# Patient Record
Sex: Female | Born: 1935 | Race: White | Hispanic: No | Marital: Married | Smoking: Never smoker
Health system: Southern US, Community
[De-identification: ages and names within clinical notes are randomized; demographics above are authoritative.]

## PROBLEM LIST (undated history)

## (undated) DIAGNOSIS — Z794 Long term (current) use of insulin: Secondary | ICD-10-CM

## (undated) DIAGNOSIS — G8929 Other chronic pain: Secondary | ICD-10-CM

## (undated) DIAGNOSIS — N393 Stress incontinence (female) (male): Secondary | ICD-10-CM

## (undated) DIAGNOSIS — I5030 Unspecified diastolic (congestive) heart failure: Secondary | ICD-10-CM

## (undated) DIAGNOSIS — I10 Essential (primary) hypertension: Secondary | ICD-10-CM

## (undated) DIAGNOSIS — M1990 Unspecified osteoarthritis, unspecified site: Secondary | ICD-10-CM

## (undated) DIAGNOSIS — Z8673 Personal history of transient ischemic attack (TIA), and cerebral infarction without residual deficits: Secondary | ICD-10-CM

## (undated) DIAGNOSIS — G4733 Obstructive sleep apnea (adult) (pediatric): Secondary | ICD-10-CM

## (undated) DIAGNOSIS — R399 Unspecified symptoms and signs involving the genitourinary system: Secondary | ICD-10-CM

## (undated) DIAGNOSIS — J449 Chronic obstructive pulmonary disease, unspecified: Secondary | ICD-10-CM

## (undated) DIAGNOSIS — E119 Type 2 diabetes mellitus without complications: Secondary | ICD-10-CM

## (undated) DIAGNOSIS — M51369 Other intervertebral disc degeneration, lumbar region without mention of lumbar back pain or lower extremity pain: Secondary | ICD-10-CM

## (undated) DIAGNOSIS — G629 Polyneuropathy, unspecified: Secondary | ICD-10-CM

## (undated) DIAGNOSIS — I8390 Asymptomatic varicose veins of unspecified lower extremity: Secondary | ICD-10-CM

## (undated) DIAGNOSIS — L409 Psoriasis, unspecified: Secondary | ICD-10-CM

## (undated) DIAGNOSIS — I2510 Atherosclerotic heart disease of native coronary artery without angina pectoris: Secondary | ICD-10-CM

## (undated) DIAGNOSIS — M5136 Other intervertebral disc degeneration, lumbar region: Secondary | ICD-10-CM

## (undated) DIAGNOSIS — E785 Hyperlipidemia, unspecified: Secondary | ICD-10-CM

## (undated) DIAGNOSIS — E039 Hypothyroidism, unspecified: Secondary | ICD-10-CM

## (undated) DIAGNOSIS — N329 Bladder disorder, unspecified: Secondary | ICD-10-CM

## (undated) DIAGNOSIS — R319 Hematuria, unspecified: Secondary | ICD-10-CM

## (undated) DIAGNOSIS — I252 Old myocardial infarction: Secondary | ICD-10-CM

## (undated) HISTORY — PX: TOTAL ABDOMINAL HYSTERECTOMY W/ BILATERAL SALPINGOOPHORECTOMY: SHX83

## (undated) HISTORY — PX: CARDIAC CATHETERIZATION: SHX172

## (undated) HISTORY — PX: CARDIOVASCULAR STRESS TEST: SHX262

## (undated) HISTORY — PX: LUMBAR SPINE SURGERY: SHX701

## (undated) HISTORY — DX: Polyneuropathy, unspecified: G62.9

## (undated) HISTORY — DX: Chronic obstructive pulmonary disease, unspecified: J44.9

## (undated) HISTORY — PX: CARPAL TUNNEL RELEASE: SHX101

## (undated) HISTORY — PX: CORONARY ARTERY BYPASS GRAFT: SHX141

## (undated) HISTORY — PX: APPENDECTOMY: SHX54

---

## 1988-12-23 HISTORY — PX: CHOLECYSTECTOMY: SHX55

## 1989-12-23 HISTORY — PX: CARDIAC CATHETERIZATION: SHX172

## 1993-12-23 HISTORY — PX: ROTATOR CUFF REPAIR: SHX139

## 1998-04-09 ENCOUNTER — Inpatient Hospital Stay (HOSPITAL_COMMUNITY): Admission: EM | Admit: 1998-04-09 | Discharge: 1998-04-12 | Payer: Self-pay | Admitting: Psychiatry

## 1998-08-10 ENCOUNTER — Emergency Department (HOSPITAL_COMMUNITY): Admission: EM | Admit: 1998-08-10 | Discharge: 1998-08-10 | Payer: Self-pay | Admitting: Emergency Medicine

## 1999-05-14 ENCOUNTER — Encounter: Payer: Self-pay | Admitting: Neurosurgery

## 1999-05-14 ENCOUNTER — Ambulatory Visit (HOSPITAL_COMMUNITY): Admission: RE | Admit: 1999-05-14 | Discharge: 1999-05-14 | Payer: Self-pay | Admitting: Neurosurgery

## 1999-05-31 ENCOUNTER — Encounter: Payer: Self-pay | Admitting: Neurosurgery

## 1999-05-31 ENCOUNTER — Ambulatory Visit (HOSPITAL_COMMUNITY): Admission: RE | Admit: 1999-05-31 | Discharge: 1999-05-31 | Payer: Self-pay | Admitting: Neurosurgery

## 1999-06-14 ENCOUNTER — Encounter: Payer: Self-pay | Admitting: Neurosurgery

## 1999-06-14 ENCOUNTER — Ambulatory Visit (HOSPITAL_COMMUNITY): Admission: RE | Admit: 1999-06-14 | Discharge: 1999-06-14 | Payer: Self-pay | Admitting: Neurosurgery

## 1999-06-28 ENCOUNTER — Ambulatory Visit (HOSPITAL_COMMUNITY): Admission: RE | Admit: 1999-06-28 | Discharge: 1999-06-28 | Payer: Self-pay | Admitting: Neurosurgery

## 1999-06-28 ENCOUNTER — Encounter: Payer: Self-pay | Admitting: Neurosurgery

## 1999-08-07 ENCOUNTER — Inpatient Hospital Stay (HOSPITAL_COMMUNITY): Admission: AD | Admit: 1999-08-07 | Discharge: 1999-08-10 | Payer: Self-pay | Admitting: Cardiology

## 1999-09-04 ENCOUNTER — Encounter: Admission: RE | Admit: 1999-09-04 | Discharge: 1999-12-03 | Payer: Self-pay | Admitting: Cardiology

## 1999-09-21 ENCOUNTER — Inpatient Hospital Stay (HOSPITAL_COMMUNITY): Admission: AD | Admit: 1999-09-21 | Discharge: 1999-09-28 | Payer: Self-pay | Admitting: Cardiology

## 1999-09-22 ENCOUNTER — Encounter: Payer: Self-pay | Admitting: Cardiology

## 1999-09-23 ENCOUNTER — Encounter: Payer: Self-pay | Admitting: Cardiology

## 1999-09-25 ENCOUNTER — Encounter: Payer: Self-pay | Admitting: Cardiology

## 1999-09-27 ENCOUNTER — Encounter: Payer: Self-pay | Admitting: Cardiology

## 1999-11-12 ENCOUNTER — Inpatient Hospital Stay (HOSPITAL_COMMUNITY): Admission: EM | Admit: 1999-11-12 | Discharge: 1999-11-14 | Payer: Self-pay | Admitting: Emergency Medicine

## 1999-11-13 HISTORY — PX: CORONARY ANGIOPLASTY: SHX604

## 2000-01-17 ENCOUNTER — Inpatient Hospital Stay (HOSPITAL_COMMUNITY): Admission: AD | Admit: 2000-01-17 | Discharge: 2000-01-18 | Payer: Self-pay | Admitting: Cardiology

## 2000-01-17 HISTORY — PX: CORONARY ANGIOPLASTY WITH STENT PLACEMENT: SHX49

## 2000-01-18 ENCOUNTER — Encounter: Payer: Self-pay | Admitting: Cardiology

## 2000-03-07 ENCOUNTER — Encounter: Payer: Self-pay | Admitting: Neurosurgery

## 2000-03-07 ENCOUNTER — Encounter: Admission: RE | Admit: 2000-03-07 | Discharge: 2000-03-07 | Payer: Self-pay | Admitting: Neurosurgery

## 2000-03-16 ENCOUNTER — Ambulatory Visit (HOSPITAL_COMMUNITY): Admission: RE | Admit: 2000-03-16 | Discharge: 2000-03-16 | Payer: Self-pay | Admitting: Neurosurgery

## 2000-03-16 ENCOUNTER — Encounter: Payer: Self-pay | Admitting: Neurosurgery

## 2000-06-30 ENCOUNTER — Inpatient Hospital Stay (HOSPITAL_COMMUNITY): Admission: EM | Admit: 2000-06-30 | Discharge: 2000-07-10 | Payer: Self-pay | Admitting: Cardiology

## 2000-07-01 ENCOUNTER — Encounter: Payer: Self-pay | Admitting: Cardiology

## 2000-07-01 HISTORY — PX: CARDIAC CATHETERIZATION: SHX172

## 2000-07-02 ENCOUNTER — Encounter: Payer: Self-pay | Admitting: Cardiothoracic Surgery

## 2000-07-02 ENCOUNTER — Encounter: Payer: Self-pay | Admitting: Thoracic Surgery (Cardiothoracic Vascular Surgery)

## 2000-07-03 ENCOUNTER — Encounter: Payer: Self-pay | Admitting: Thoracic Surgery (Cardiothoracic Vascular Surgery)

## 2000-07-04 ENCOUNTER — Encounter: Payer: Self-pay | Admitting: Thoracic Surgery (Cardiothoracic Vascular Surgery)

## 2000-07-10 ENCOUNTER — Encounter: Payer: Self-pay | Admitting: Cardiothoracic Surgery

## 2000-07-21 ENCOUNTER — Encounter: Payer: Self-pay | Admitting: Thoracic Surgery (Cardiothoracic Vascular Surgery)

## 2000-07-21 ENCOUNTER — Encounter
Admission: RE | Admit: 2000-07-21 | Discharge: 2000-07-21 | Payer: Self-pay | Admitting: Thoracic Surgery (Cardiothoracic Vascular Surgery)

## 2001-04-24 ENCOUNTER — Encounter: Payer: Self-pay | Admitting: *Deleted

## 2001-04-24 ENCOUNTER — Ambulatory Visit (HOSPITAL_COMMUNITY): Admission: RE | Admit: 2001-04-24 | Discharge: 2001-04-24 | Payer: Self-pay | Admitting: *Deleted

## 2001-04-28 ENCOUNTER — Inpatient Hospital Stay (HOSPITAL_COMMUNITY): Admission: EM | Admit: 2001-04-28 | Discharge: 2001-05-01 | Payer: Self-pay | Admitting: Family Medicine

## 2001-04-29 ENCOUNTER — Encounter: Payer: Self-pay | Admitting: Family Medicine

## 2001-04-30 HISTORY — PX: CARDIAC CATHETERIZATION: SHX172

## 2001-05-28 ENCOUNTER — Encounter: Admission: RE | Admit: 2001-05-28 | Discharge: 2001-08-26 | Payer: Self-pay | Admitting: Family Medicine

## 2001-07-10 ENCOUNTER — Encounter: Payer: Self-pay | Admitting: Family Medicine

## 2001-07-10 ENCOUNTER — Ambulatory Visit (HOSPITAL_COMMUNITY): Admission: RE | Admit: 2001-07-10 | Discharge: 2001-07-10 | Payer: Self-pay | Admitting: Family Medicine

## 2001-07-11 ENCOUNTER — Encounter: Payer: Self-pay | Admitting: Emergency Medicine

## 2001-07-11 ENCOUNTER — Emergency Department (HOSPITAL_COMMUNITY): Admission: EM | Admit: 2001-07-11 | Discharge: 2001-07-11 | Payer: Self-pay | Admitting: Emergency Medicine

## 2002-07-12 ENCOUNTER — Ambulatory Visit (HOSPITAL_COMMUNITY): Admission: RE | Admit: 2002-07-12 | Discharge: 2002-07-12 | Payer: Self-pay | Admitting: Family Medicine

## 2002-07-12 ENCOUNTER — Encounter: Payer: Self-pay | Admitting: Family Medicine

## 2002-08-26 ENCOUNTER — Ambulatory Visit (HOSPITAL_COMMUNITY): Admission: RE | Admit: 2002-08-26 | Discharge: 2002-08-26 | Payer: Self-pay | Admitting: Family Medicine

## 2002-08-26 ENCOUNTER — Encounter: Payer: Self-pay | Admitting: Family Medicine

## 2002-08-31 ENCOUNTER — Ambulatory Visit (HOSPITAL_COMMUNITY): Admission: RE | Admit: 2002-08-31 | Discharge: 2002-08-31 | Payer: Self-pay | Admitting: Family Medicine

## 2002-08-31 ENCOUNTER — Encounter: Payer: Self-pay | Admitting: Family Medicine

## 2002-09-09 ENCOUNTER — Encounter: Admission: RE | Admit: 2002-09-09 | Discharge: 2002-12-08 | Payer: Self-pay | Admitting: Family Medicine

## 2002-11-03 ENCOUNTER — Ambulatory Visit (HOSPITAL_COMMUNITY): Admission: RE | Admit: 2002-11-03 | Discharge: 2002-11-03 | Payer: Self-pay | Admitting: Internal Medicine

## 2003-01-12 ENCOUNTER — Encounter: Payer: Self-pay | Admitting: Emergency Medicine

## 2003-01-13 ENCOUNTER — Inpatient Hospital Stay (HOSPITAL_COMMUNITY): Admission: EM | Admit: 2003-01-13 | Discharge: 2003-01-19 | Payer: Self-pay | Admitting: Emergency Medicine

## 2003-01-15 ENCOUNTER — Encounter: Payer: Self-pay | Admitting: Internal Medicine

## 2003-02-15 ENCOUNTER — Encounter: Payer: Self-pay | Admitting: Family Medicine

## 2003-02-15 ENCOUNTER — Ambulatory Visit (HOSPITAL_COMMUNITY): Admission: RE | Admit: 2003-02-15 | Discharge: 2003-02-15 | Payer: Self-pay | Admitting: Family Medicine

## 2003-02-28 ENCOUNTER — Ambulatory Visit (HOSPITAL_COMMUNITY): Admission: RE | Admit: 2003-02-28 | Discharge: 2003-02-28 | Payer: Self-pay | Admitting: *Deleted

## 2003-03-31 ENCOUNTER — Ambulatory Visit (HOSPITAL_COMMUNITY): Admission: RE | Admit: 2003-03-31 | Discharge: 2003-03-31 | Payer: Self-pay | Admitting: Pulmonary Disease

## 2003-04-06 ENCOUNTER — Ambulatory Visit: Admission: RE | Admit: 2003-04-06 | Discharge: 2003-04-06 | Payer: Self-pay | Admitting: Pulmonary Disease

## 2003-08-29 ENCOUNTER — Emergency Department (HOSPITAL_COMMUNITY): Admission: EM | Admit: 2003-08-29 | Discharge: 2003-08-29 | Payer: Self-pay | Admitting: Emergency Medicine

## 2003-08-29 ENCOUNTER — Encounter: Payer: Self-pay | Admitting: Emergency Medicine

## 2003-09-02 ENCOUNTER — Ambulatory Visit: Admission: RE | Admit: 2003-09-02 | Discharge: 2003-09-02 | Payer: Self-pay | Admitting: Orthopedic Surgery

## 2003-09-02 ENCOUNTER — Encounter: Payer: Self-pay | Admitting: Orthopedic Surgery

## 2003-09-26 ENCOUNTER — Ambulatory Visit (HOSPITAL_COMMUNITY): Admission: RE | Admit: 2003-09-26 | Discharge: 2003-09-26 | Payer: Self-pay | Admitting: Orthopedic Surgery

## 2003-09-26 ENCOUNTER — Encounter: Payer: Self-pay | Admitting: Orthopedic Surgery

## 2003-10-18 ENCOUNTER — Ambulatory Visit (HOSPITAL_COMMUNITY): Admission: RE | Admit: 2003-10-18 | Discharge: 2003-10-18 | Payer: Self-pay | Admitting: Orthopedic Surgery

## 2003-10-18 HISTORY — PX: KNEE ARTHROSCOPY: SUR90

## 2003-10-21 ENCOUNTER — Emergency Department (HOSPITAL_COMMUNITY): Admission: EM | Admit: 2003-10-21 | Discharge: 2003-10-21 | Payer: Self-pay | Admitting: *Deleted

## 2003-10-28 ENCOUNTER — Encounter (HOSPITAL_COMMUNITY): Admission: RE | Admit: 2003-10-28 | Discharge: 2003-11-27 | Payer: Self-pay | Admitting: Orthopedic Surgery

## 2004-01-26 ENCOUNTER — Encounter (HOSPITAL_COMMUNITY): Admission: RE | Admit: 2004-01-26 | Discharge: 2004-01-27 | Payer: Self-pay | Admitting: Cardiology

## 2004-05-29 ENCOUNTER — Ambulatory Visit (HOSPITAL_COMMUNITY): Admission: RE | Admit: 2004-05-29 | Discharge: 2004-05-29 | Payer: Self-pay | Admitting: Family Medicine

## 2005-02-12 ENCOUNTER — Ambulatory Visit (HOSPITAL_COMMUNITY): Admission: RE | Admit: 2005-02-12 | Discharge: 2005-02-12 | Payer: Self-pay | Admitting: Family Medicine

## 2005-06-19 ENCOUNTER — Ambulatory Visit (HOSPITAL_COMMUNITY): Admission: RE | Admit: 2005-06-19 | Discharge: 2005-06-19 | Payer: Self-pay | Admitting: Family Medicine

## 2005-07-24 ENCOUNTER — Ambulatory Visit (HOSPITAL_COMMUNITY): Admission: RE | Admit: 2005-07-24 | Discharge: 2005-07-24 | Payer: Self-pay | Admitting: Urology

## 2005-09-18 ENCOUNTER — Ambulatory Visit (HOSPITAL_COMMUNITY): Admission: RE | Admit: 2005-09-18 | Discharge: 2005-09-18 | Payer: Self-pay | Admitting: Family Medicine

## 2006-02-03 ENCOUNTER — Ambulatory Visit: Payer: Self-pay | Admitting: *Deleted

## 2006-02-04 ENCOUNTER — Ambulatory Visit: Payer: Self-pay | Admitting: *Deleted

## 2006-02-04 ENCOUNTER — Encounter: Payer: Self-pay | Admitting: Cardiology

## 2006-02-04 ENCOUNTER — Encounter (HOSPITAL_COMMUNITY): Admission: RE | Admit: 2006-02-04 | Discharge: 2006-03-06 | Payer: Self-pay | Admitting: *Deleted

## 2006-02-10 ENCOUNTER — Ambulatory Visit: Payer: Self-pay | Admitting: *Deleted

## 2006-02-13 ENCOUNTER — Ambulatory Visit: Payer: Self-pay | Admitting: Internal Medicine

## 2006-02-13 ENCOUNTER — Inpatient Hospital Stay (HOSPITAL_BASED_OUTPATIENT_CLINIC_OR_DEPARTMENT_OTHER): Admission: RE | Admit: 2006-02-13 | Discharge: 2006-02-13 | Payer: Self-pay | Admitting: Internal Medicine

## 2006-03-10 ENCOUNTER — Ambulatory Visit: Payer: Self-pay | Admitting: Internal Medicine

## 2006-07-26 ENCOUNTER — Emergency Department (HOSPITAL_COMMUNITY): Admission: EM | Admit: 2006-07-26 | Discharge: 2006-07-27 | Payer: Self-pay | Admitting: Emergency Medicine

## 2006-08-04 ENCOUNTER — Emergency Department (HOSPITAL_COMMUNITY): Admission: EM | Admit: 2006-08-04 | Discharge: 2006-08-04 | Payer: Self-pay | Admitting: Emergency Medicine

## 2006-09-10 ENCOUNTER — Inpatient Hospital Stay (HOSPITAL_COMMUNITY): Admission: EM | Admit: 2006-09-10 | Discharge: 2006-09-16 | Payer: Self-pay | Admitting: Emergency Medicine

## 2006-09-19 ENCOUNTER — Ambulatory Visit (HOSPITAL_COMMUNITY): Admission: RE | Admit: 2006-09-19 | Discharge: 2006-09-19 | Payer: Self-pay | Admitting: Family Medicine

## 2006-10-10 ENCOUNTER — Emergency Department (HOSPITAL_COMMUNITY): Admission: EM | Admit: 2006-10-10 | Discharge: 2006-10-10 | Payer: Self-pay | Admitting: Emergency Medicine

## 2006-10-30 ENCOUNTER — Ambulatory Visit (HOSPITAL_COMMUNITY): Admission: RE | Admit: 2006-10-30 | Discharge: 2006-10-30 | Payer: Self-pay | Admitting: Family Medicine

## 2007-02-17 IMAGING — CR DG ELBOW COMPLETE 3+V*R*
2 series · 2 of 2 positions shown · non-contrast
Comparison: none

HISTORY: Pain, injury

RIGHT ELBOW 4 VIEWS:
Mineralization normal.
Joint spaces preserved.
No fracture, dislocation or bone destruction.
No joint effusion.

[view not recorded (1 of 2)]
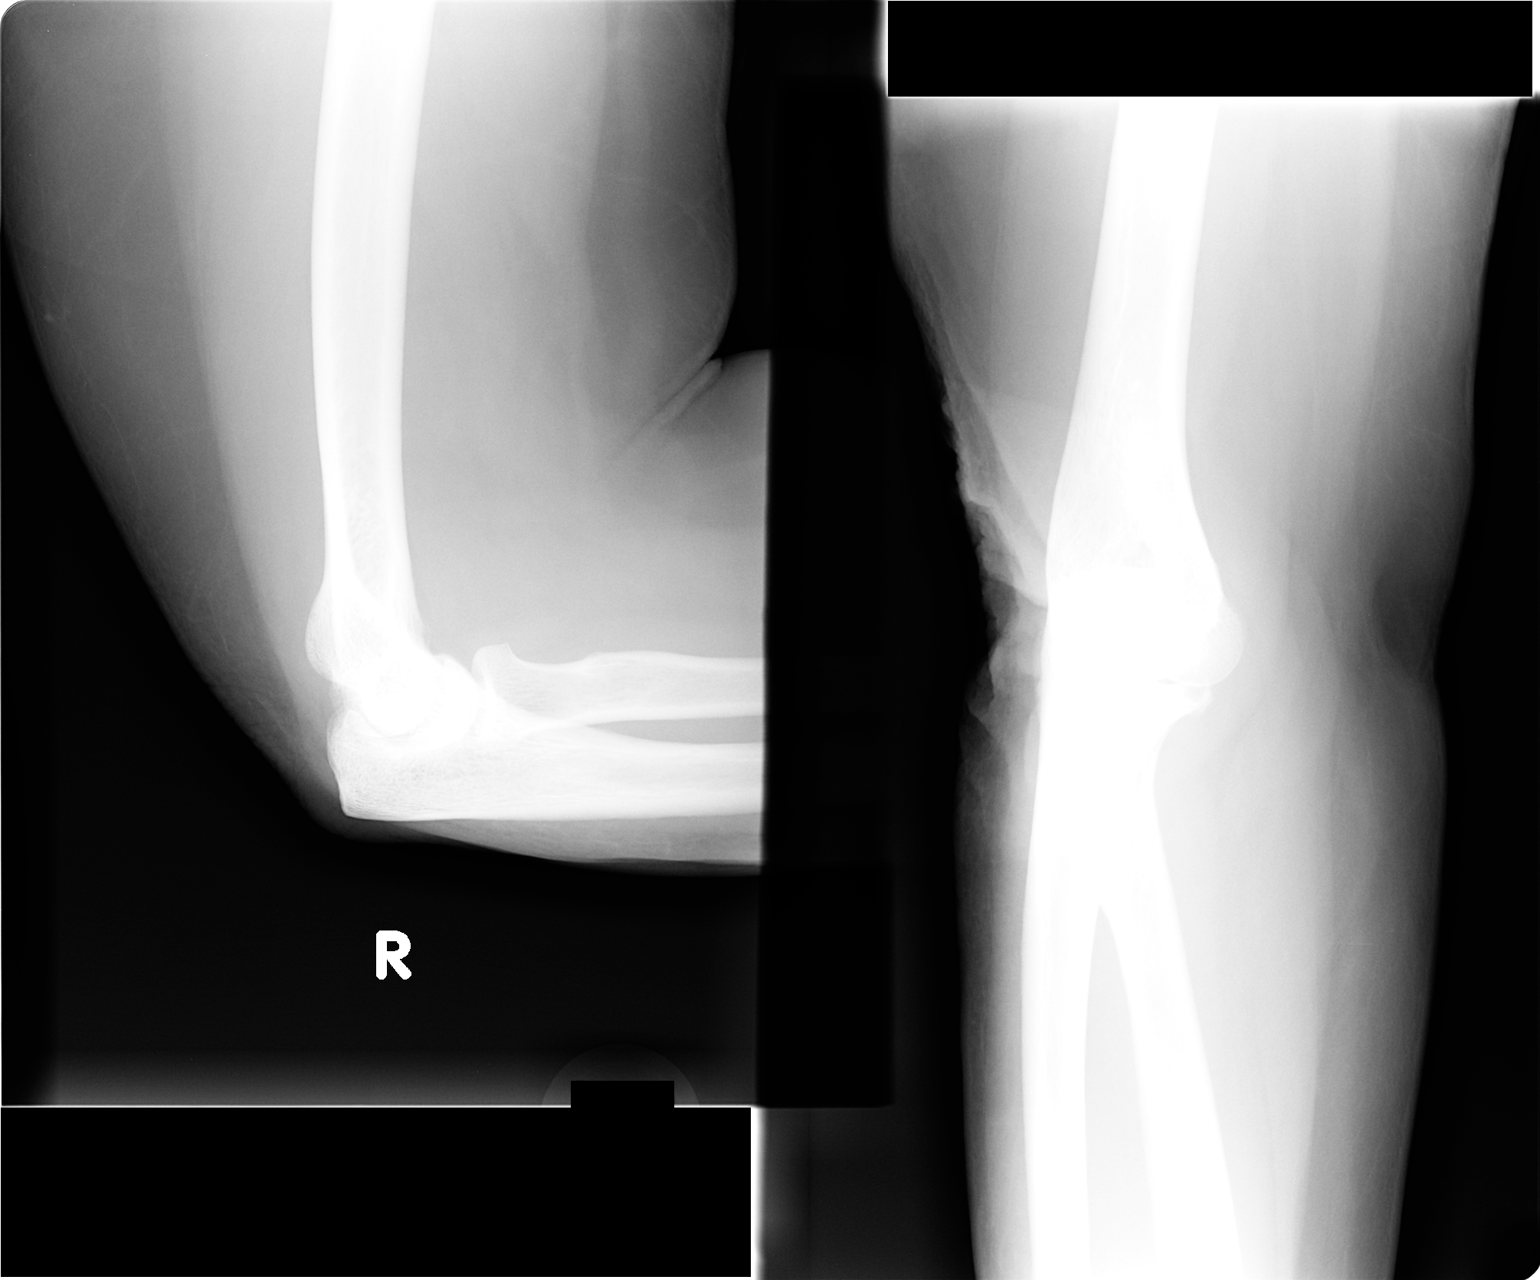

[view not recorded (2 of 2)]
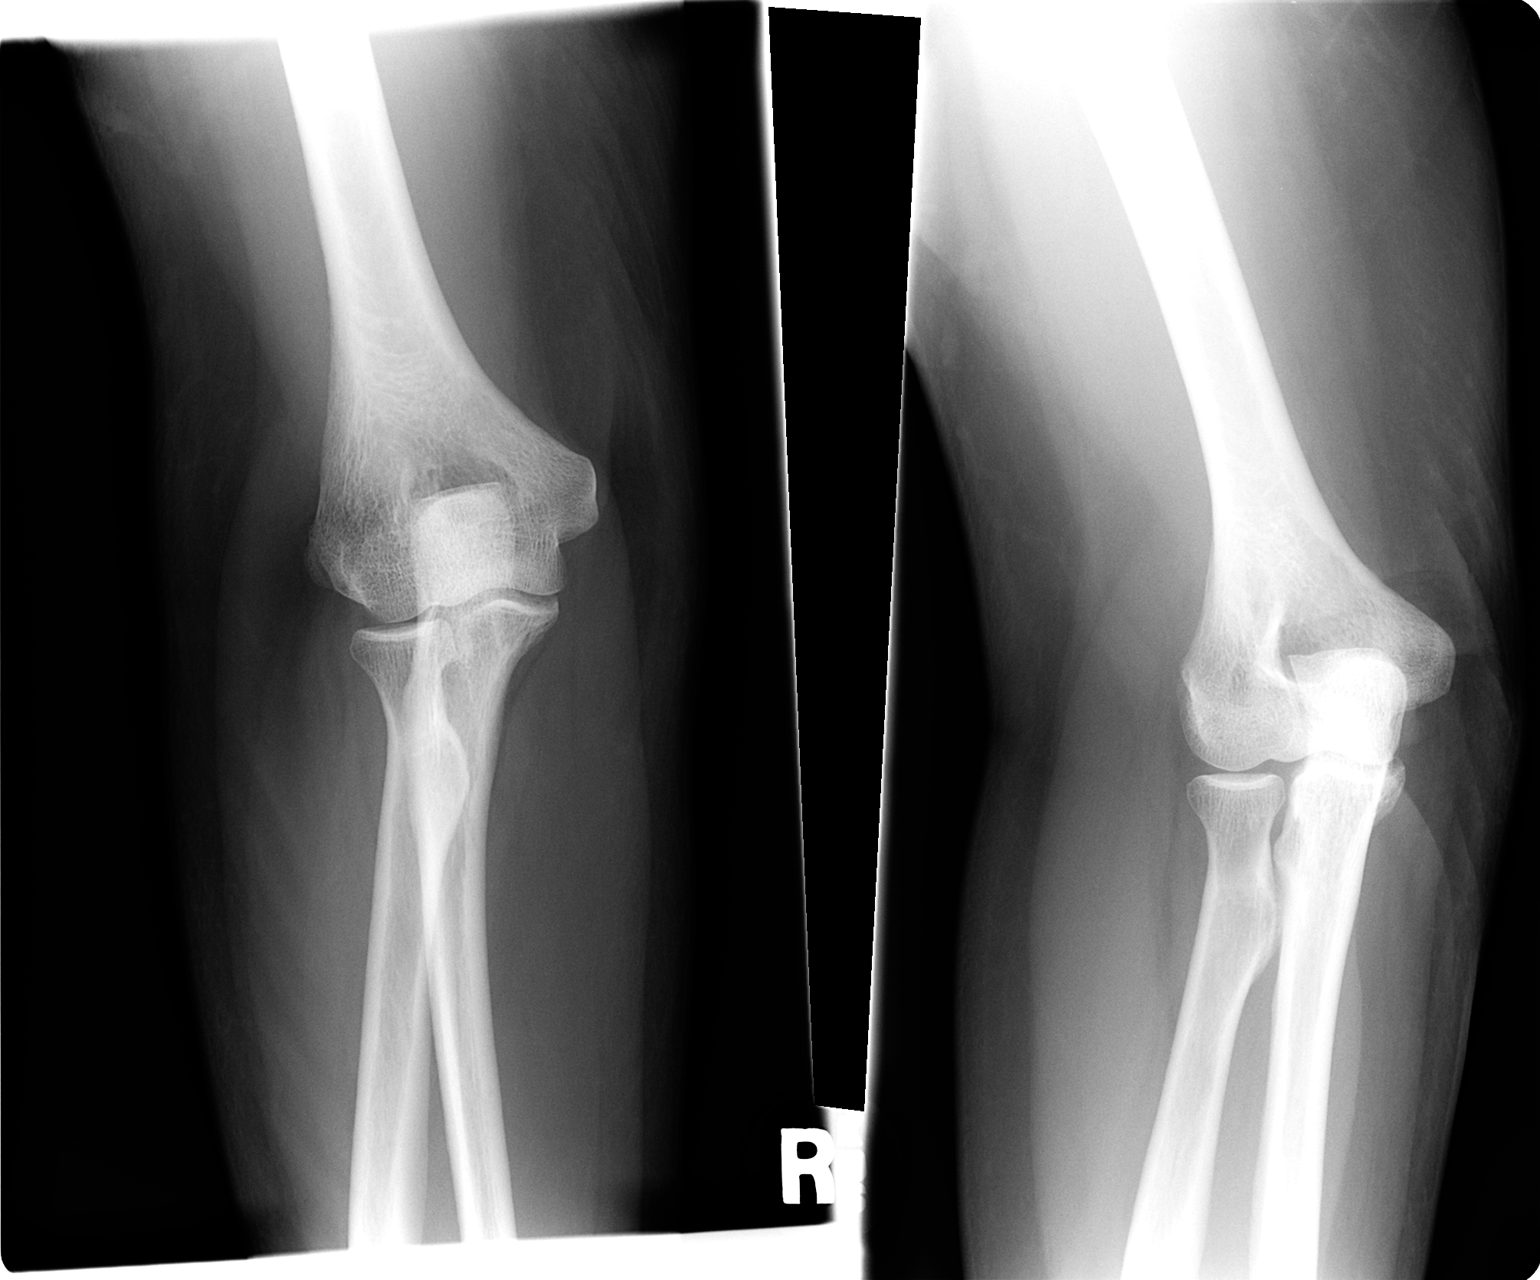

[2 of 2 positions shown; findings below may reference images not displayed]

IMPRESSION: No acute abnormalities.

## 2007-02-18 ENCOUNTER — Inpatient Hospital Stay (HOSPITAL_COMMUNITY): Admission: EM | Admit: 2007-02-18 | Discharge: 2007-02-25 | Payer: Self-pay | Admitting: Emergency Medicine

## 2007-06-09 ENCOUNTER — Emergency Department (HOSPITAL_COMMUNITY): Admission: EM | Admit: 2007-06-09 | Discharge: 2007-06-10 | Payer: Self-pay | Admitting: Emergency Medicine

## 2007-06-24 IMAGING — CR DG CHEST 2V
2 series · 2 of 2 positions shown · non-contrast
Comparison: none

HISTORY: Cough, fever

[view not recorded (1 of 2)]
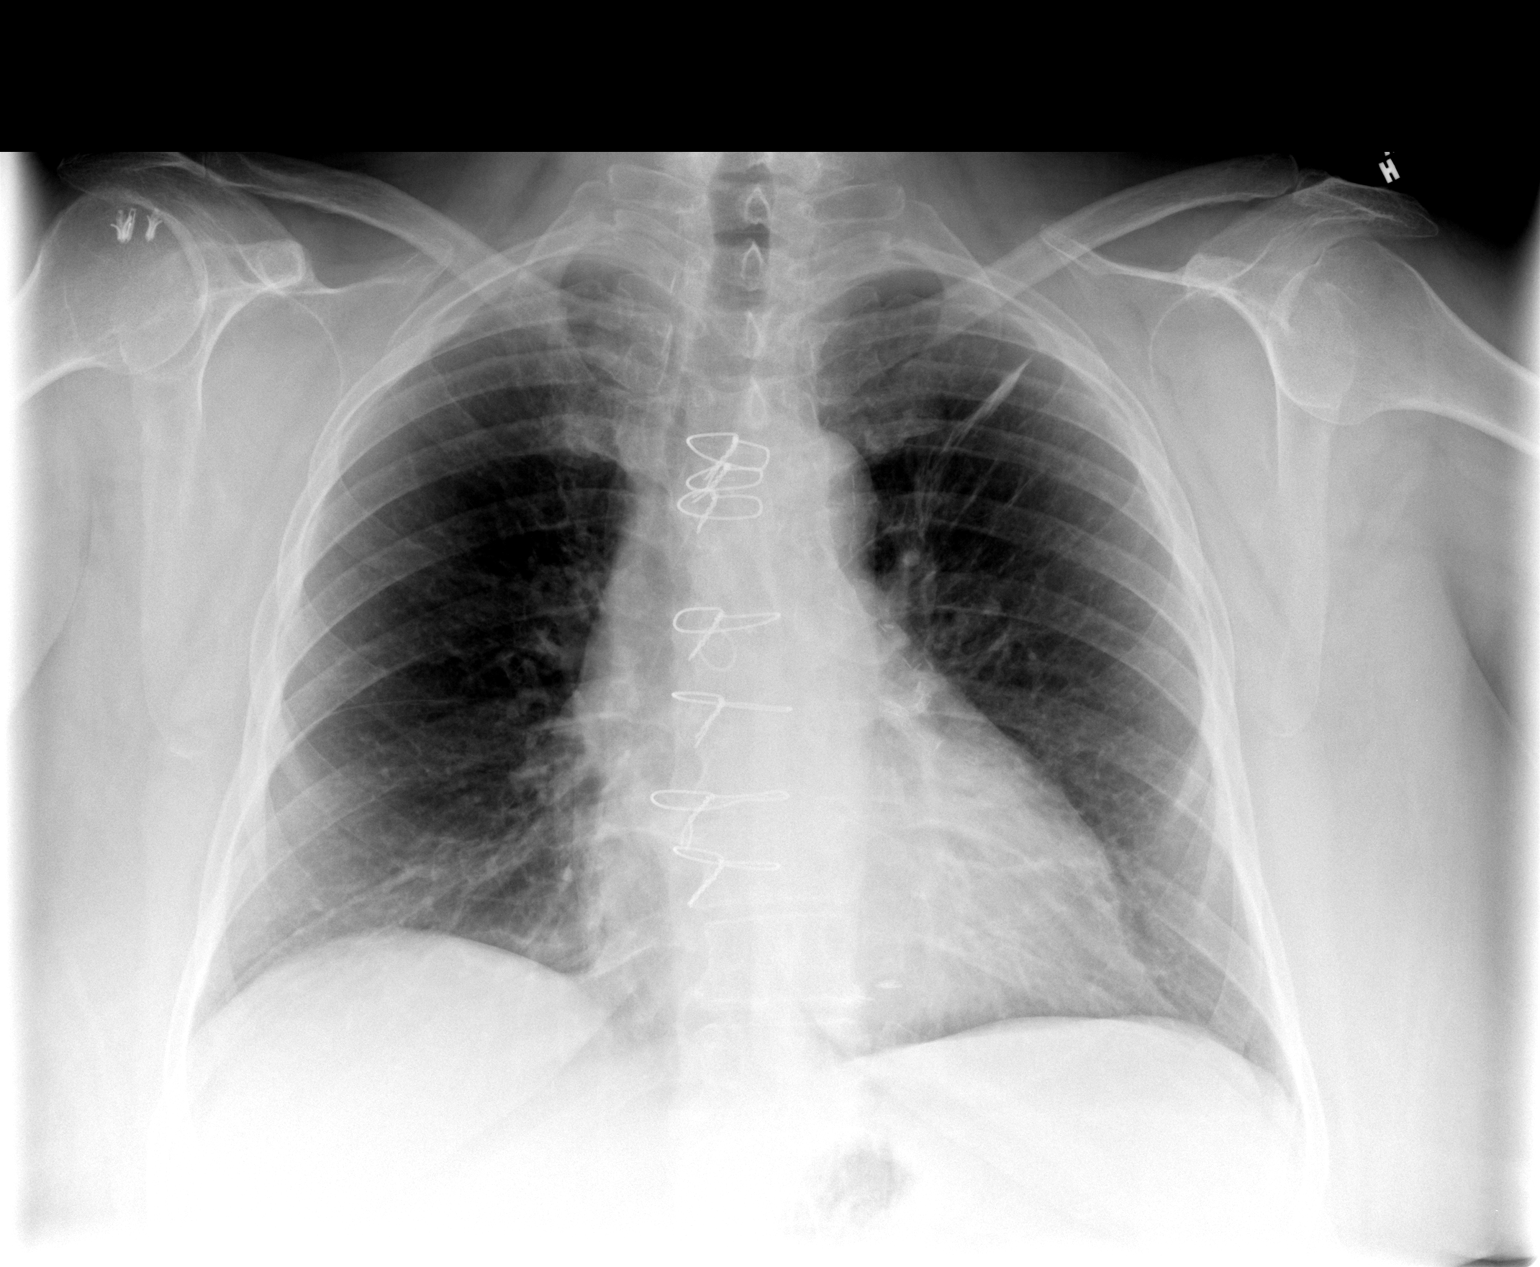

[view not recorded (2 of 2)]
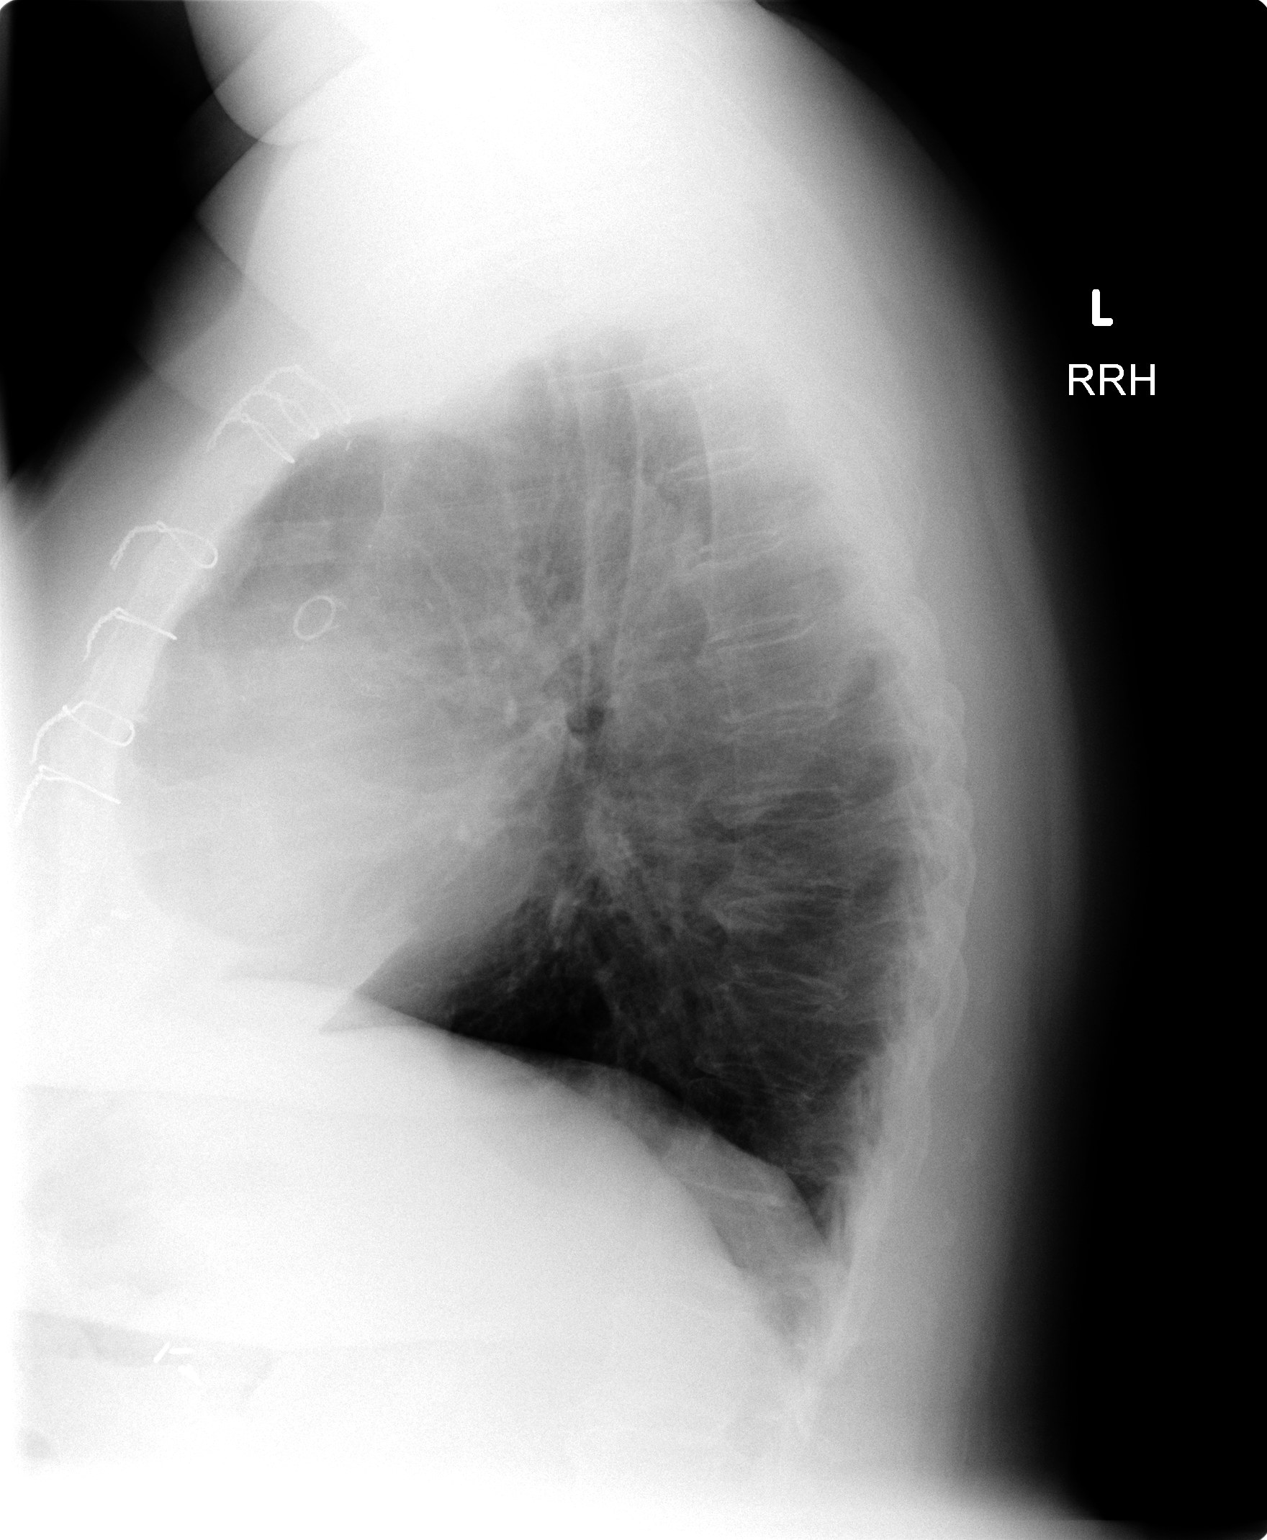

[2 of 2 positions shown; findings below may reference images not displayed]

CHEST 2 VIEWS:

No prior study currently available for comparison

Mild cardiac enlargement status post CABG.
Mediastinal contours and vascularity normal.
Linear subsegmental atelectasis or scarring left upper lobe.
No infiltrate, failure, or effusion.
Spur formation thoracic spine.
No pneumothorax.
Prior rotator cuff surgery, right shoulder.
IMPRESSION: CABG.
Linear scarring versus subsegmental atelectasis left upper lobe.

## 2007-07-03 ENCOUNTER — Ambulatory Visit (HOSPITAL_COMMUNITY): Admission: RE | Admit: 2007-07-03 | Discharge: 2007-07-03 | Payer: Self-pay | Admitting: Urology

## 2007-07-29 IMAGING — US US RENAL
2 series · 14 of 25 positions shown · non-contrast
Comparison: None.  
 RENAL ULTRASOUND:

CLINICAL DATA: Patient with recurrent urinary tract infections.
TECHNIQUE: Complete ultrasound examination of the urinary tract was performed including evaluation of the kidneys, renal collecting systems, and urinary bladder.

[Series 1: unknown · 0.34mm/px · 13 of 43 slices shown (1 of 2)]
[im 1/43]
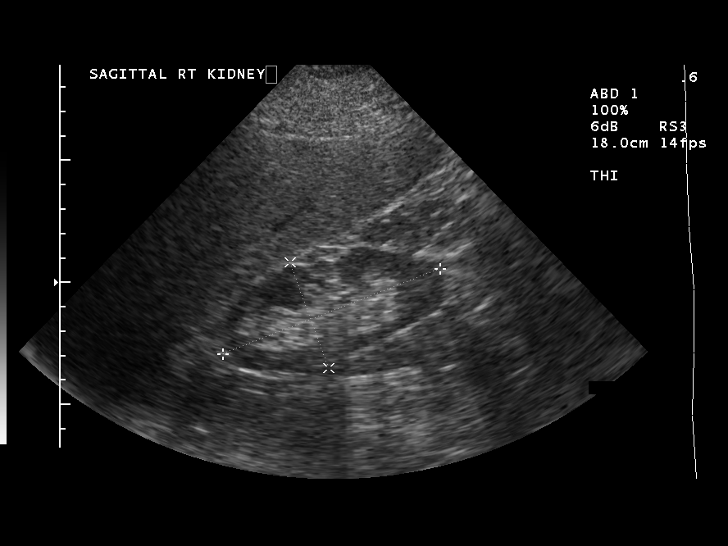
[im 4/43]
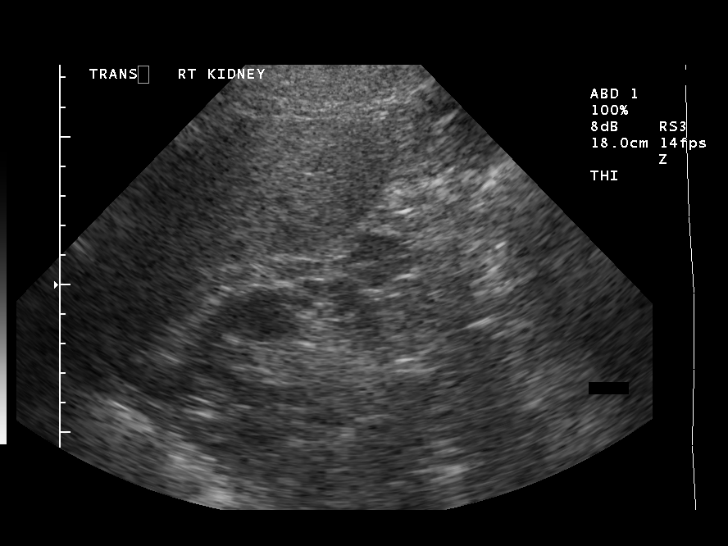
[im 8/43]
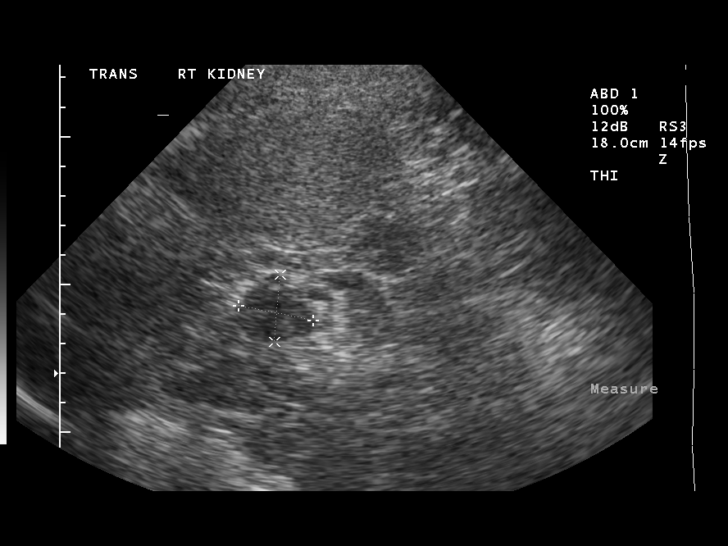
[im 12/43]
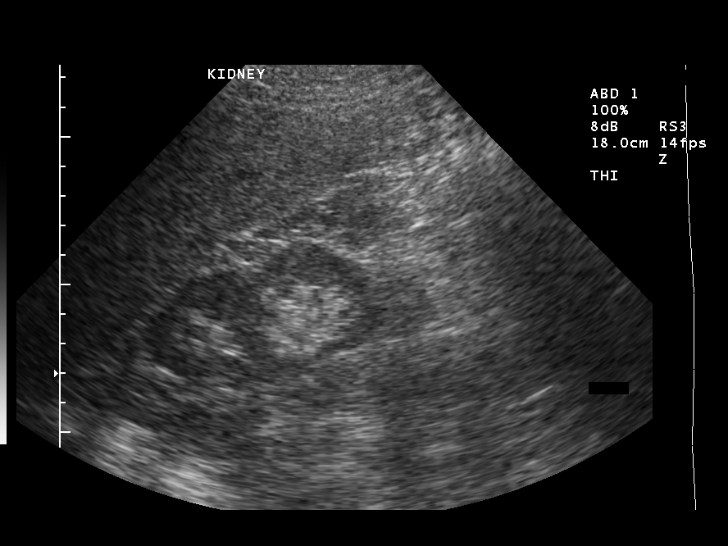
[im 16/43]
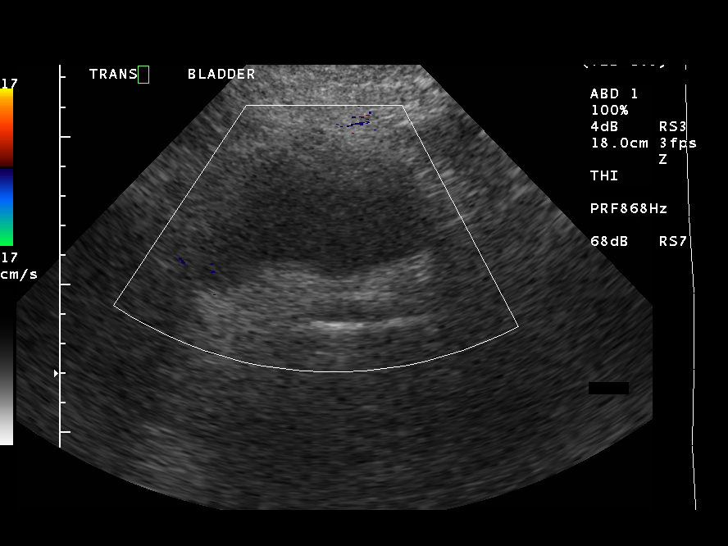
[im 18/43]
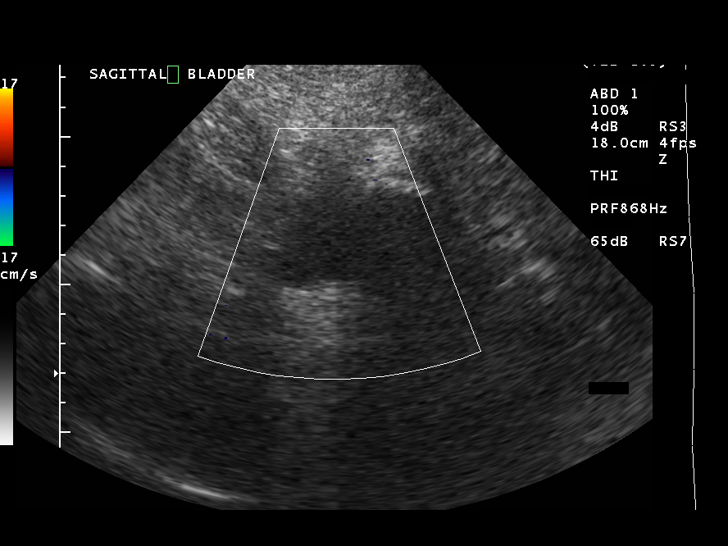
[im 22/43]
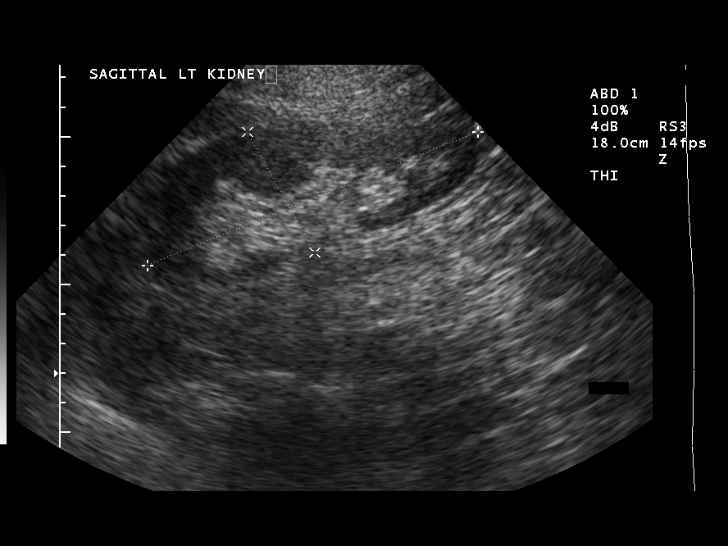
[im 25/43]
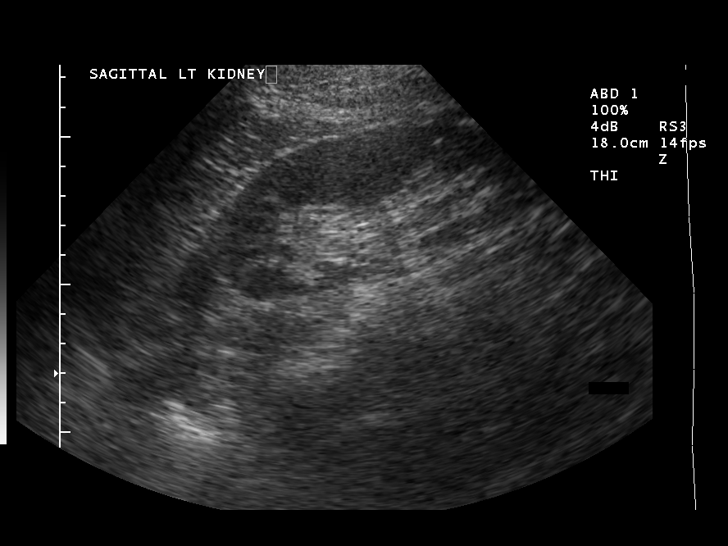
[im 29/43]
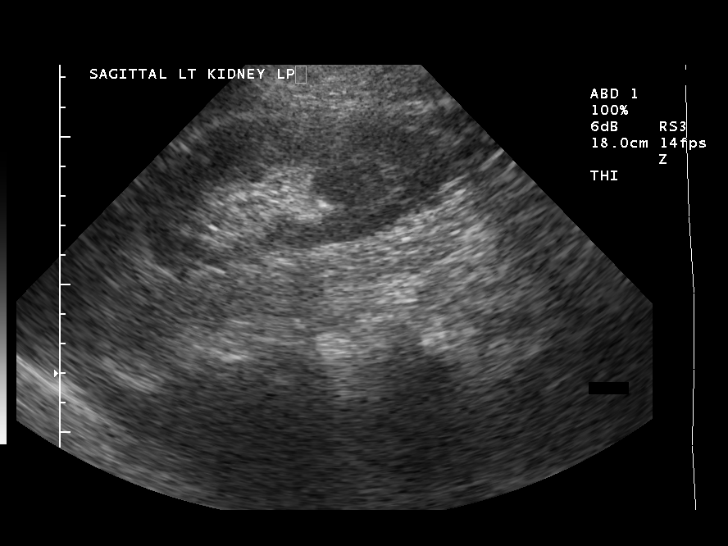
[im 31/43]
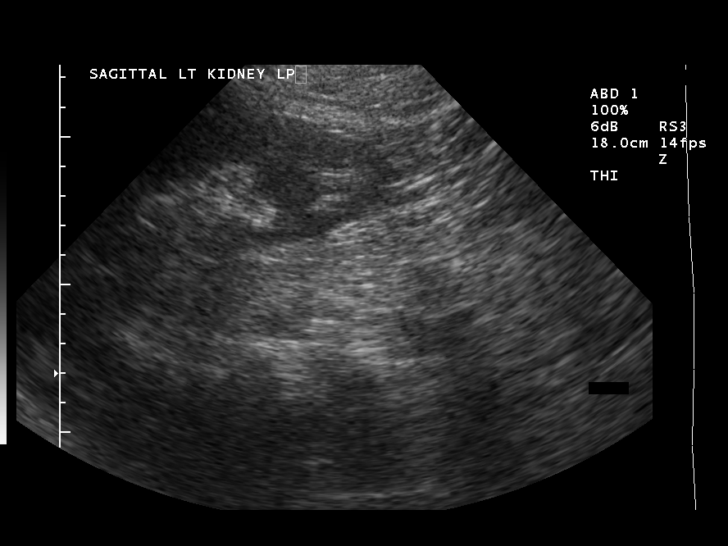
[im 35/43]
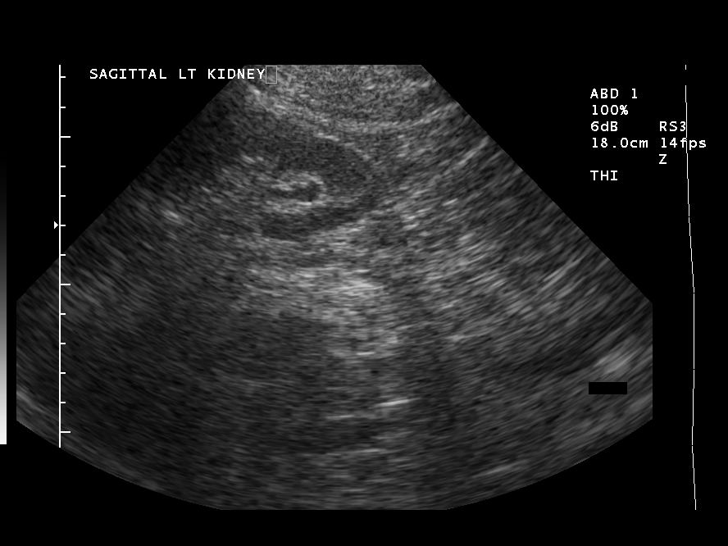
[im 39/43]
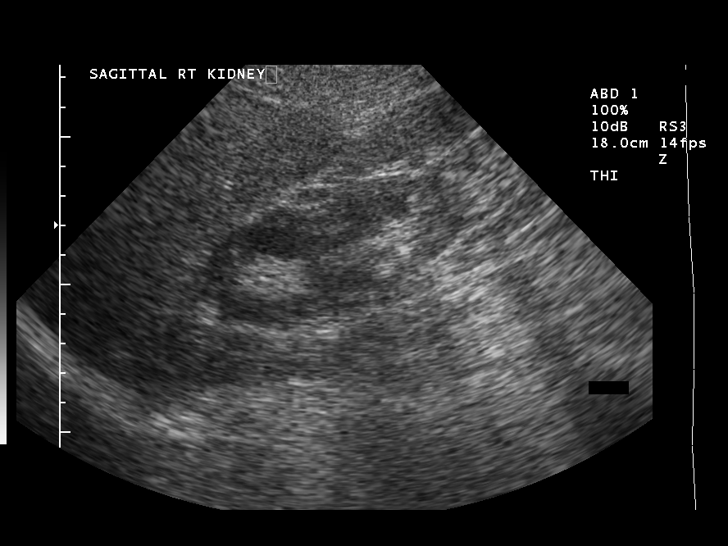
[im 43/43]
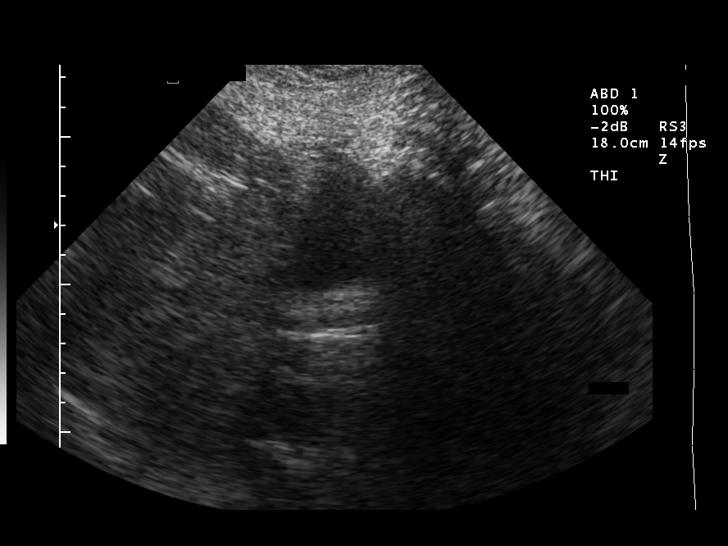

[Series 1: unknown · 0.26mm/px · 1 of 4 slices shown (2 of 2)]
[im 4/4]
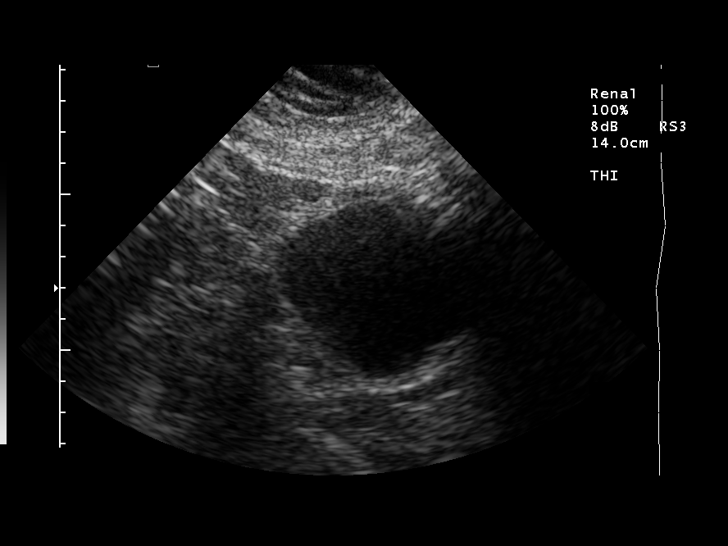

[14 of 25 positions shown; findings below may reference images not displayed]

FINDINGS: Left kidney measures 12.0 cm in length without stones, masses, or hydronephrosis.  
 The right kidney measures 9.5 cm in length and has a lobulated cortex.  There are areas of cortical thinning present.  No stones, masses or hydronephrosis.  Urinary bladder appears normal.
IMPRESSION: Asymmetry in size of the kidneys with the right smaller than the left with lobulated cortex.  This could be secondary to scarring.  Question prior infection or vesicoureteral reflux.  Examination is otherwise negative.

## 2007-09-15 ENCOUNTER — Emergency Department (HOSPITAL_COMMUNITY): Admission: EM | Admit: 2007-09-15 | Discharge: 2007-09-16 | Payer: Self-pay | Admitting: Emergency Medicine

## 2007-09-16 ENCOUNTER — Inpatient Hospital Stay (HOSPITAL_COMMUNITY): Admission: EM | Admit: 2007-09-16 | Discharge: 2007-09-22 | Payer: Self-pay | Admitting: Emergency Medicine

## 2007-09-16 ENCOUNTER — Ambulatory Visit: Payer: Self-pay | Admitting: Cardiovascular Disease

## 2007-12-03 ENCOUNTER — Emergency Department (HOSPITAL_COMMUNITY): Admission: EM | Admit: 2007-12-03 | Discharge: 2007-12-03 | Payer: Self-pay | Admitting: Emergency Medicine

## 2007-12-21 ENCOUNTER — Ambulatory Visit (HOSPITAL_COMMUNITY): Admission: RE | Admit: 2007-12-21 | Discharge: 2007-12-21 | Payer: Self-pay | Admitting: Family Medicine

## 2008-01-07 ENCOUNTER — Ambulatory Visit (HOSPITAL_COMMUNITY): Admission: RE | Admit: 2008-01-07 | Discharge: 2008-01-07 | Payer: Self-pay | Admitting: Family Medicine

## 2008-02-09 IMAGING — NM NM MYOCAR MULTI W/ SPECT
1 series · 6 of 6 positions shown · non-contrast
Comparison: none

CLINICAL DATA: Ms. Aujla is a 69-year-old woman with known coronary disease status post bypass surgery in 8999, where she received [REDACTED] to her LAD and a saphenous vein graft to a diagonal.  She had a perfusion study done in 6778, which showed normal LV systolic function.  No significant ischemia.  She presents with atypical chest pain; this is an evaluation for ischemia.
 MYOCARDIAL PERFUSION IMAGING STUDY
 DOSE:
 1.  10 mCi Pc-QQA Myoview was injected at rest.
 2.  30 mCi Pc-QQA Myoview was injected at peak exercise.
 DETAILS OF THE PROCEDURE:  Following the injection of low dose Pc-QQA Myoview, SPECT imaging of the chest was performed.  The patient then exercised on a Bruce protocol, and when she had attained at least 85% of her max predicted heart rate for her age, high dose 2c-OOm Myoview was infused.  SPECT imaging of the chest was performed.  The images were recreated in the short axis, horizontal long, and vertical long axes and the post stress images were used in a gating protocol to assess left ventricular size, wall motion and function.  
 RESULTS:  There is uniform perfusion throughout all myocardial segments.  There is no evidence of ischemia or scar.  There is mild breast attenuation.  The overall ejection fraction is 71%.  There are no wall motion abnormalities seen.  
 STRESS TEST:  The patient exercised 5 minutes and 16 seconds of a Bruce protocol, attaining 7 mets of exercise.  Her heart rate increased from 78 beats per minute to 138 beats per minute, which is 90% of her max predicted heart rate for her age.  Her blood pressure went from 130/68 to 188/62.  During that time, there were no ischemic ST-T wave changes and no arrhythmia was seen.

[Series 1: cr cardiac tc low dose · 6.52mm/px · 6 of 64 frames shown]
[frame 6/64]
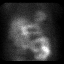
[frame 16/64]
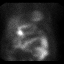
[frame 27/64]
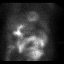
[frame 38/64]
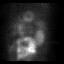
[frame 48/64]
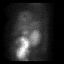
[frame 59/64]
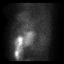

[6 of 6 positions shown; findings below may reference images not displayed]

IMPRESSION: This is a low risk scan in a patient with known coronary artery disease.  Clinical correlation is advised.

## 2008-02-09 IMAGING — NM NM MYOCAR PERF WALL MOTION
3 series · 18 of 18 positions shown · non-contrast
Comparison: none

CLINICAL DATA: Ms. Aujla is a 69-year-old woman with known coronary disease status post bypass surgery in 8999, where she received [REDACTED] to her LAD and a saphenous vein graft to a diagonal.  She had a perfusion study done in 6778, which showed normal LV systolic function.  No significant ischemia.  She presents with atypical chest pain; this is an evaluation for ischemia.
 MYOCARDIAL PERFUSION IMAGING STUDY
 DOSE:
 1.  10 mCi Pc-QQA Myoview was injected at rest.
 2.  30 mCi Pc-QQA Myoview was injected at peak exercise.
 DETAILS OF THE PROCEDURE:  Following the injection of low dose Pc-QQA Myoview, SPECT imaging of the chest was performed.  The patient then exercised on a Bruce protocol, and when she had attained at least 85% of her max predicted heart rate for her age, high dose 2c-OOm Myoview was infused.  SPECT imaging of the chest was performed.  The images were recreated in the short axis, horizontal long, and vertical long axes and the post stress images were used in a gating protocol to assess left ventricular size, wall motion and function.  
 RESULTS:  There is uniform perfusion throughout all myocardial segments.  There is no evidence of ischemia or scar.  There is mild breast attenuation.  The overall ejection fraction is 71%.  There are no wall motion abnormalities seen.  
 STRESS TEST:  The patient exercised 5 minutes and 16 seconds of a Bruce protocol, attaining 7 mets of exercise.  Her heart rate increased from 78 beats per minute to 138 beats per minute, which is 90% of her max predicted heart rate for her age.  Her blood pressure went from 130/68 to 188/62.  During that time, there were no ischemic ST-T wave changes and no arrhythmia was seen.

[Series 1: cs cardiac tc hi dose · 6.52mm/px · 6 of 510 frames shown]
[frame 43/510]
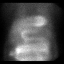
[frame 128/510]
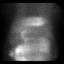
[frame 213/510]
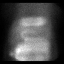
[frame 298/510]
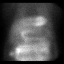
[frame 383/510]
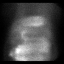
[frame 468/510]
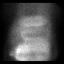

[Series 1: cr cardiac tc low dose · 6.52mm/px · 6 of 64 frames shown (1 of 2)]
[frame 6/64]
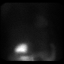
[frame 16/64]
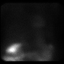
[frame 27/64]
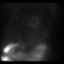
[frame 38/64]
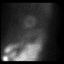
[frame 48/64]
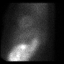
[frame 59/64]
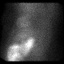

[Series 1: cr cardiac tc low dose · 6.52mm/px · 6 of 64 frames shown (2 of 2)]
[frame 6/64]
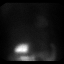
[frame 16/64]
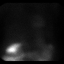
[frame 27/64]
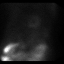
[frame 38/64]
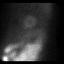
[frame 48/64]
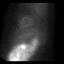
[frame 59/64]
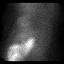

[18 of 18 positions shown; findings below may reference images not displayed]

IMPRESSION: This is a low risk scan in a patient with known coronary artery disease.  Clinical correlation is advised.

## 2008-04-11 ENCOUNTER — Ambulatory Visit (HOSPITAL_COMMUNITY): Admission: RE | Admit: 2008-04-11 | Discharge: 2008-04-11 | Payer: Self-pay | Admitting: Family Medicine

## 2008-04-18 ENCOUNTER — Ambulatory Visit: Payer: Self-pay | Admitting: Cardiology

## 2008-04-18 ENCOUNTER — Inpatient Hospital Stay (HOSPITAL_COMMUNITY): Admission: EM | Admit: 2008-04-18 | Discharge: 2008-04-23 | Payer: Self-pay | Admitting: Emergency Medicine

## 2008-04-18 HISTORY — PX: ORIF HUMERUS FRACTURE: SHX2126

## 2008-08-08 IMAGING — CR DG CHEST 2V
2 series · 2 of 2 positions shown · non-contrast
Comparison: 06/19/05.

CLINICAL DATA: Chest pain, shortness of breath, and congestion.  History of CABG.
 CHEST - 2 VIEW - 08/04/06:

[view not recorded (1 of 2)]
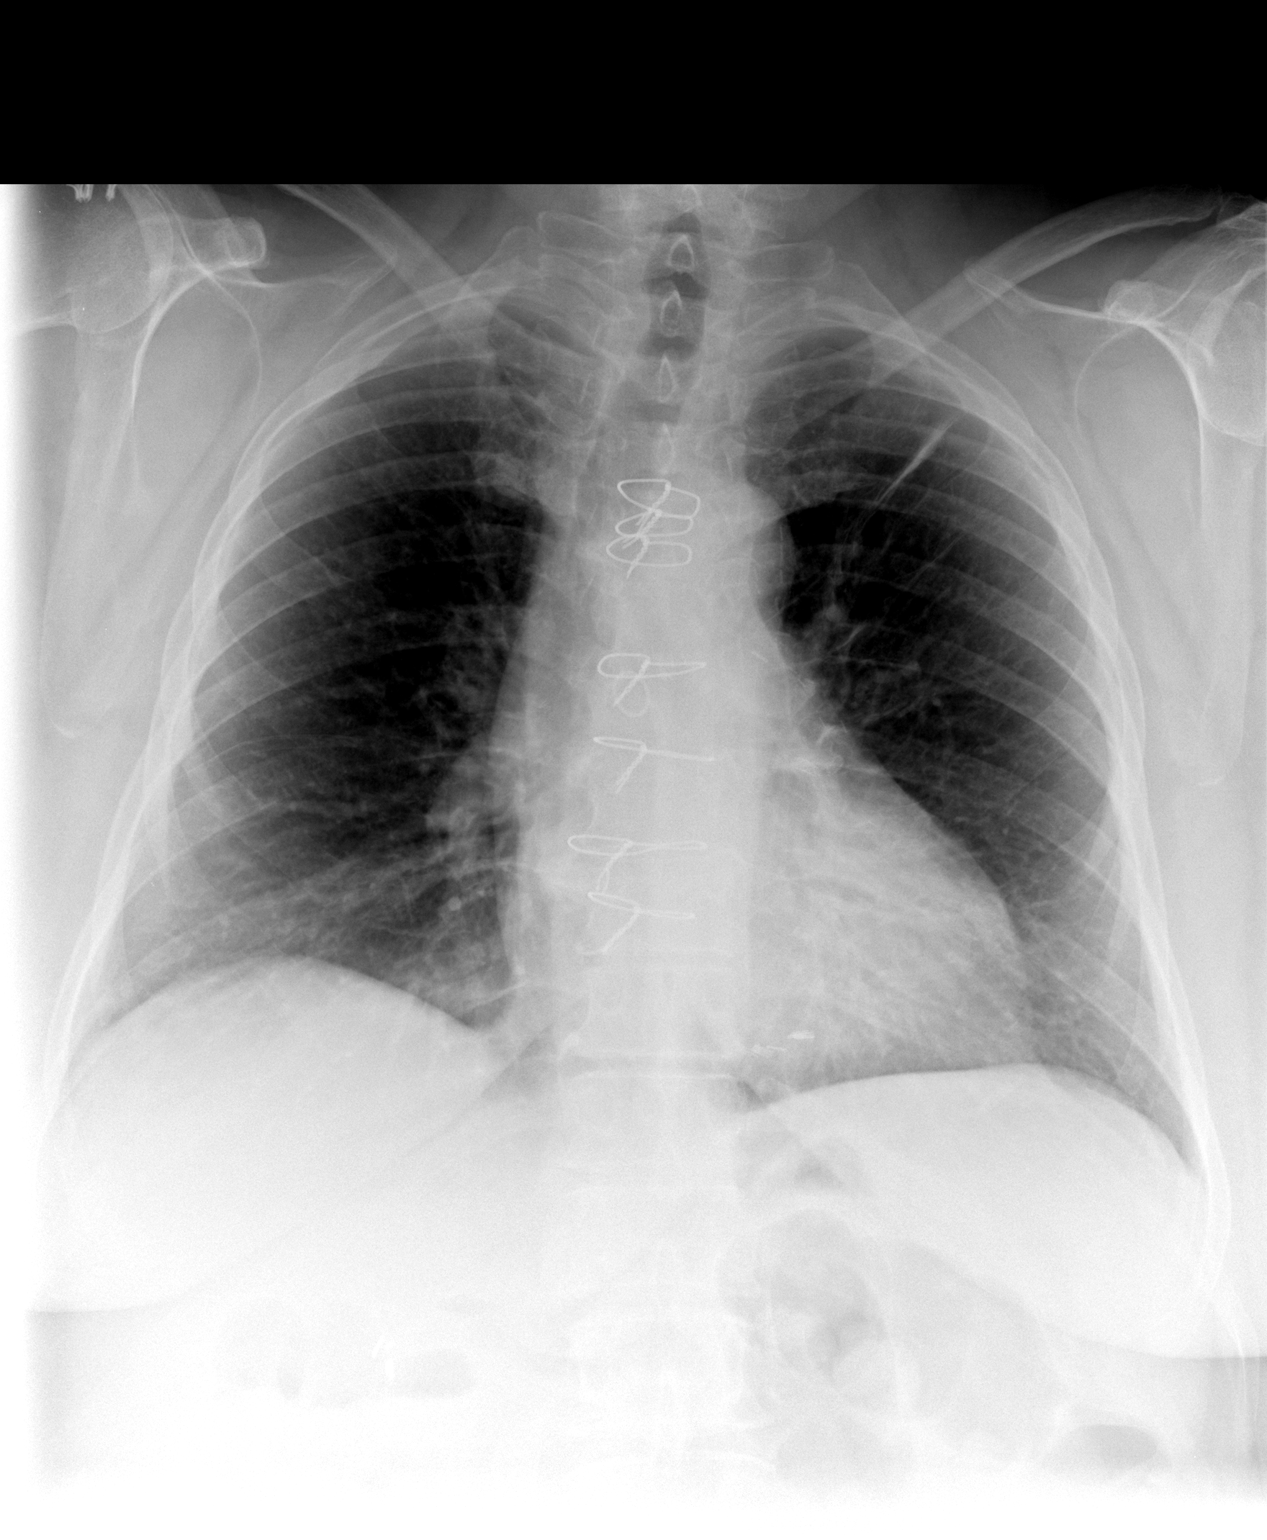

[view not recorded (2 of 2)]
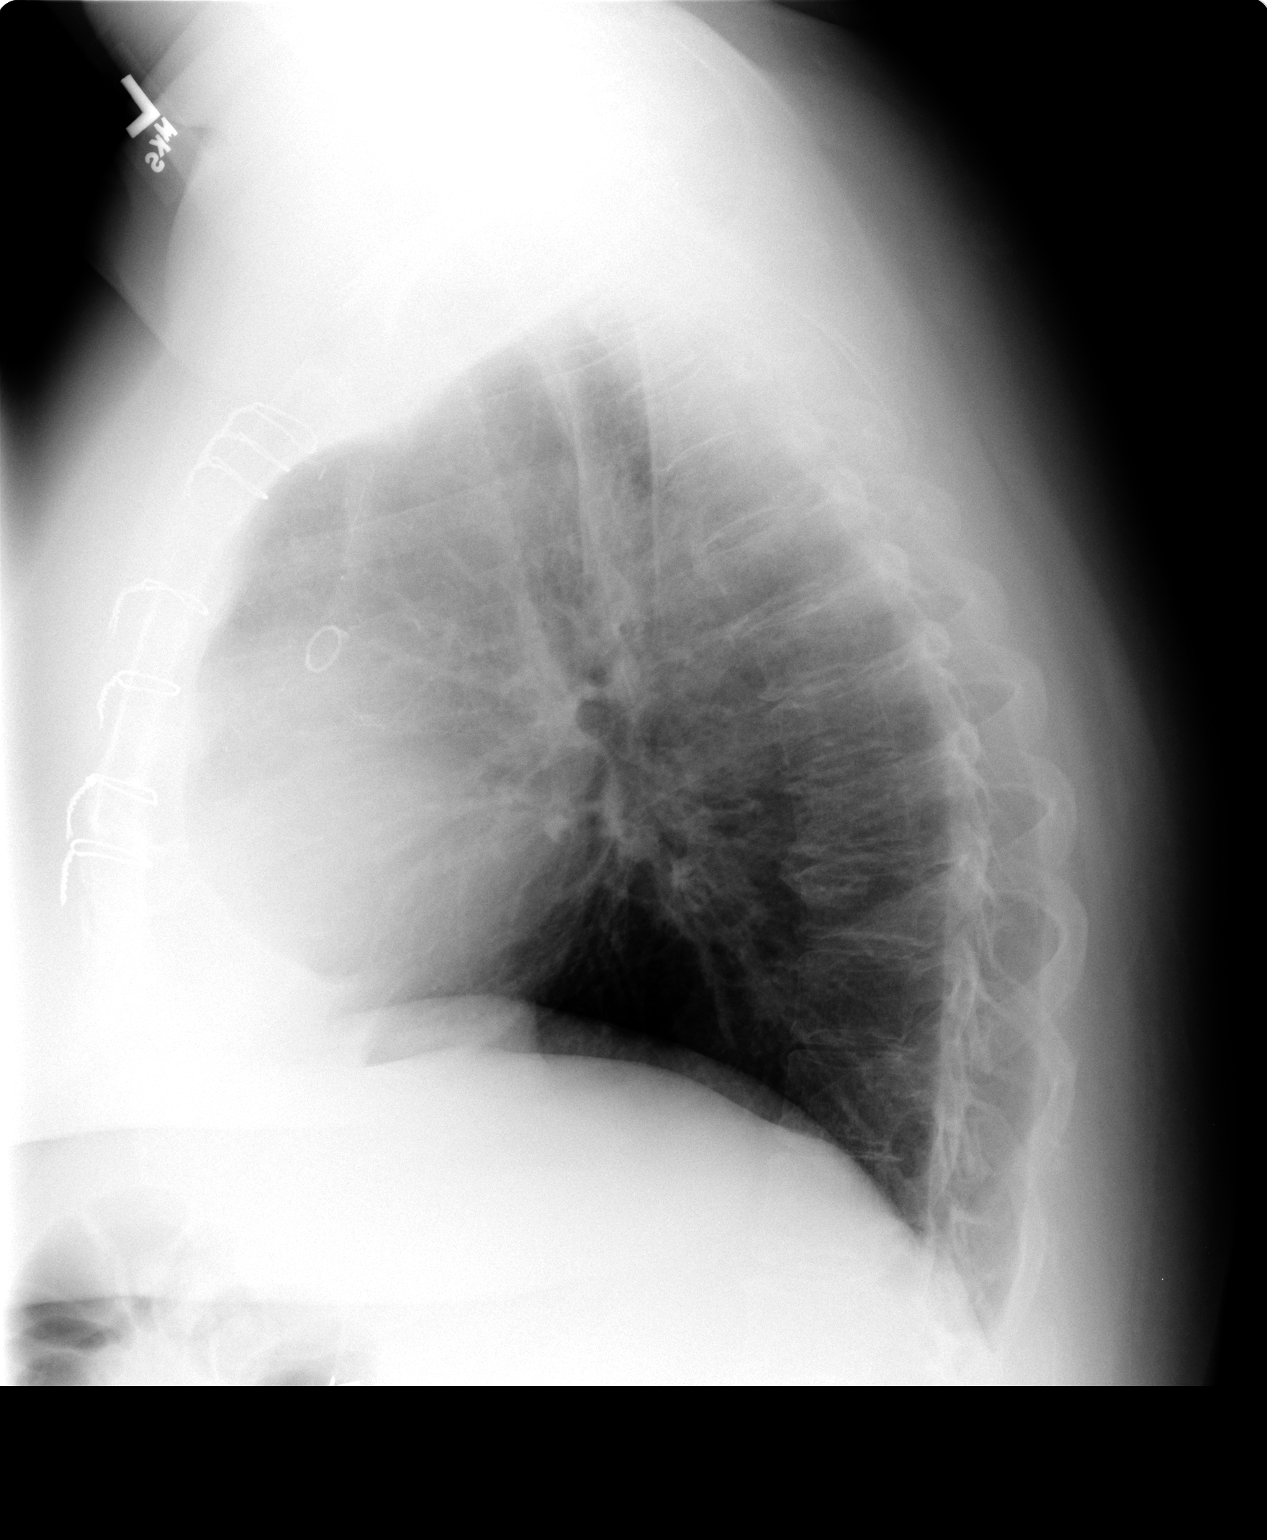

[2 of 2 positions shown; findings below may reference images not displayed]

FINDINGS: There is a stable area of parenchymal scarring at the left lung apex.  Other scattered areas of scarring and atelectasis are present.  No edema, infiltrate, or pleural effusion.  Stable heart size following prior CABG.
IMPRESSION: Stable chest with areas of parenchymal scarring and atelectasis present bilaterally.  No acute findings.

## 2008-09-14 IMAGING — CR DG CHEST 1V PORT
1 series · 1 of 1 positions shown · non-contrast
Comparison: 08/04/2006

CLINICAL DATA: Shortness of breath

PORTABLE CHEST - 1 VIEW:

[view not recorded]
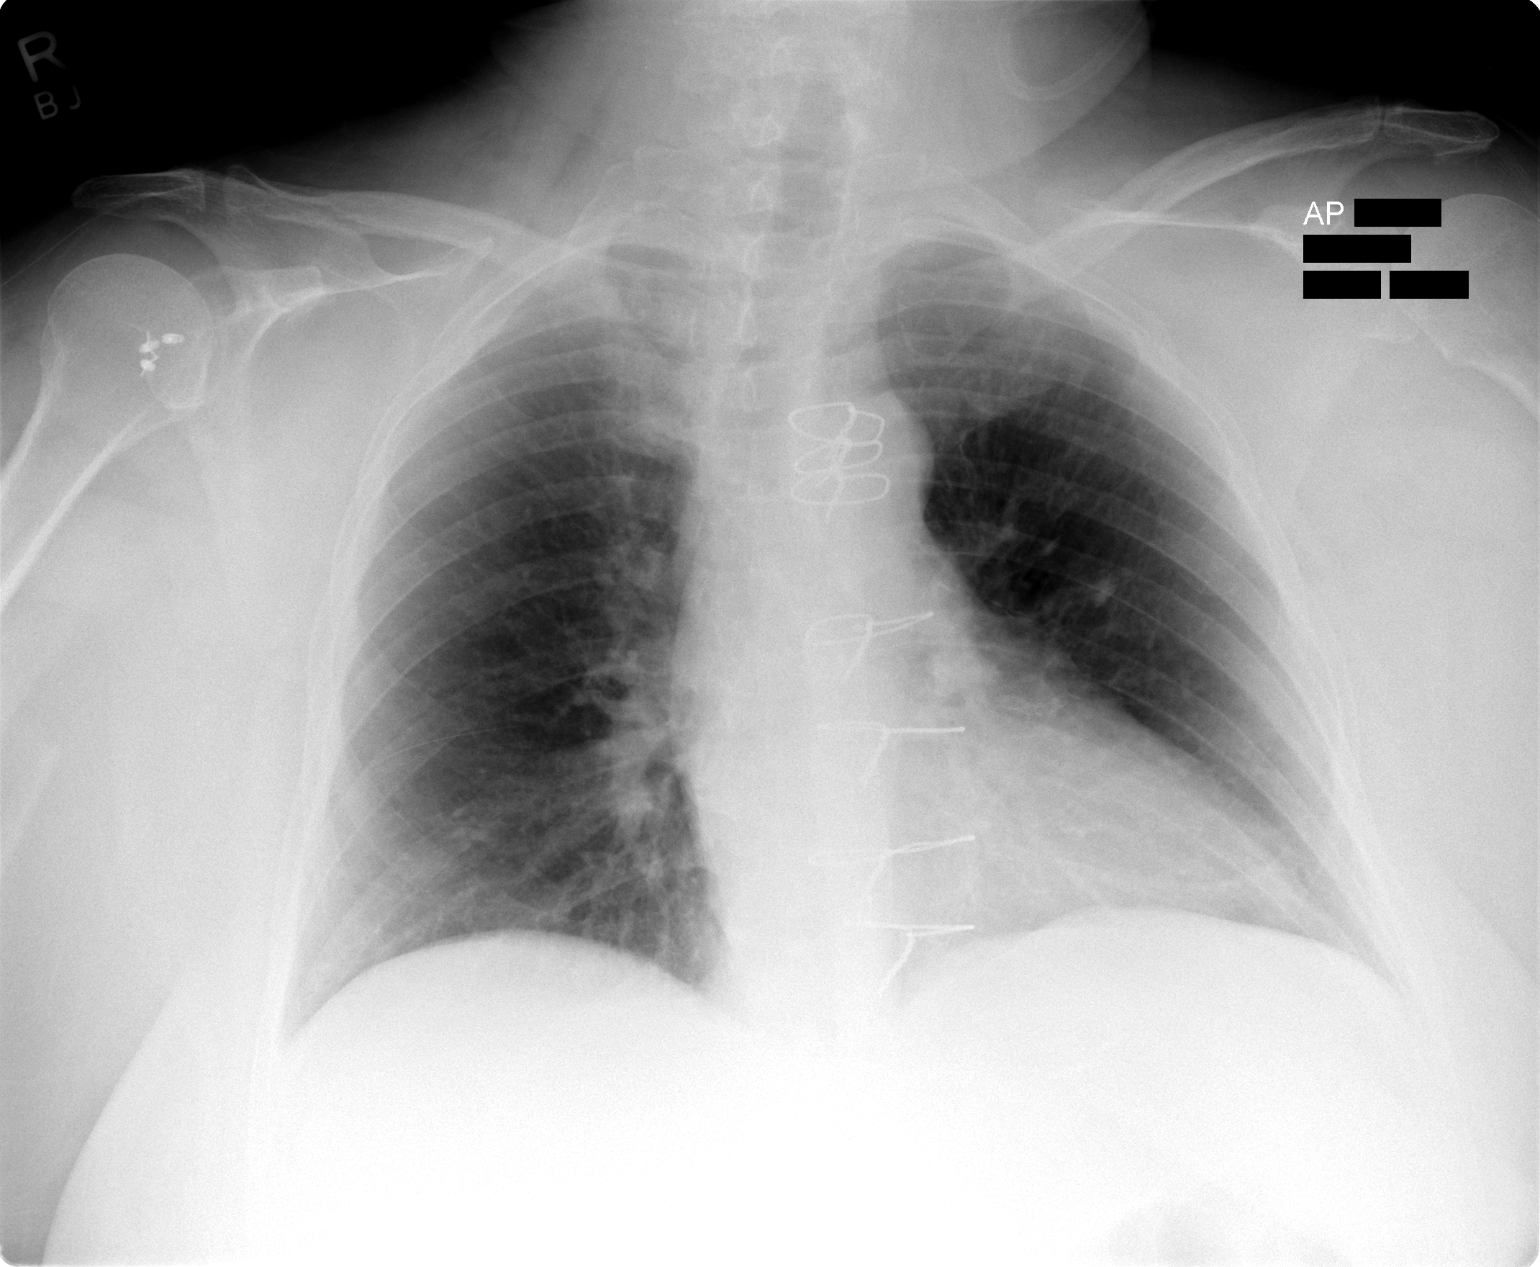

[1 of 1 positions shown; findings below may reference images not displayed]

FINDINGS: Patient status post median sternotomy. Heart is upper limits of
normal in size. No focal opacities or effusions.
IMPRESSION: No acute findings.

## 2008-09-18 IMAGING — CR DG CHEST 2V
2 series · 2 of 2 positions shown · non-contrast
Comparison: 09/10/06.

CLINICAL DATA: Short of breath. 
 CHEST ? 2 VIEW ([DATE] HOURS):

[view not recorded (1 of 2)]
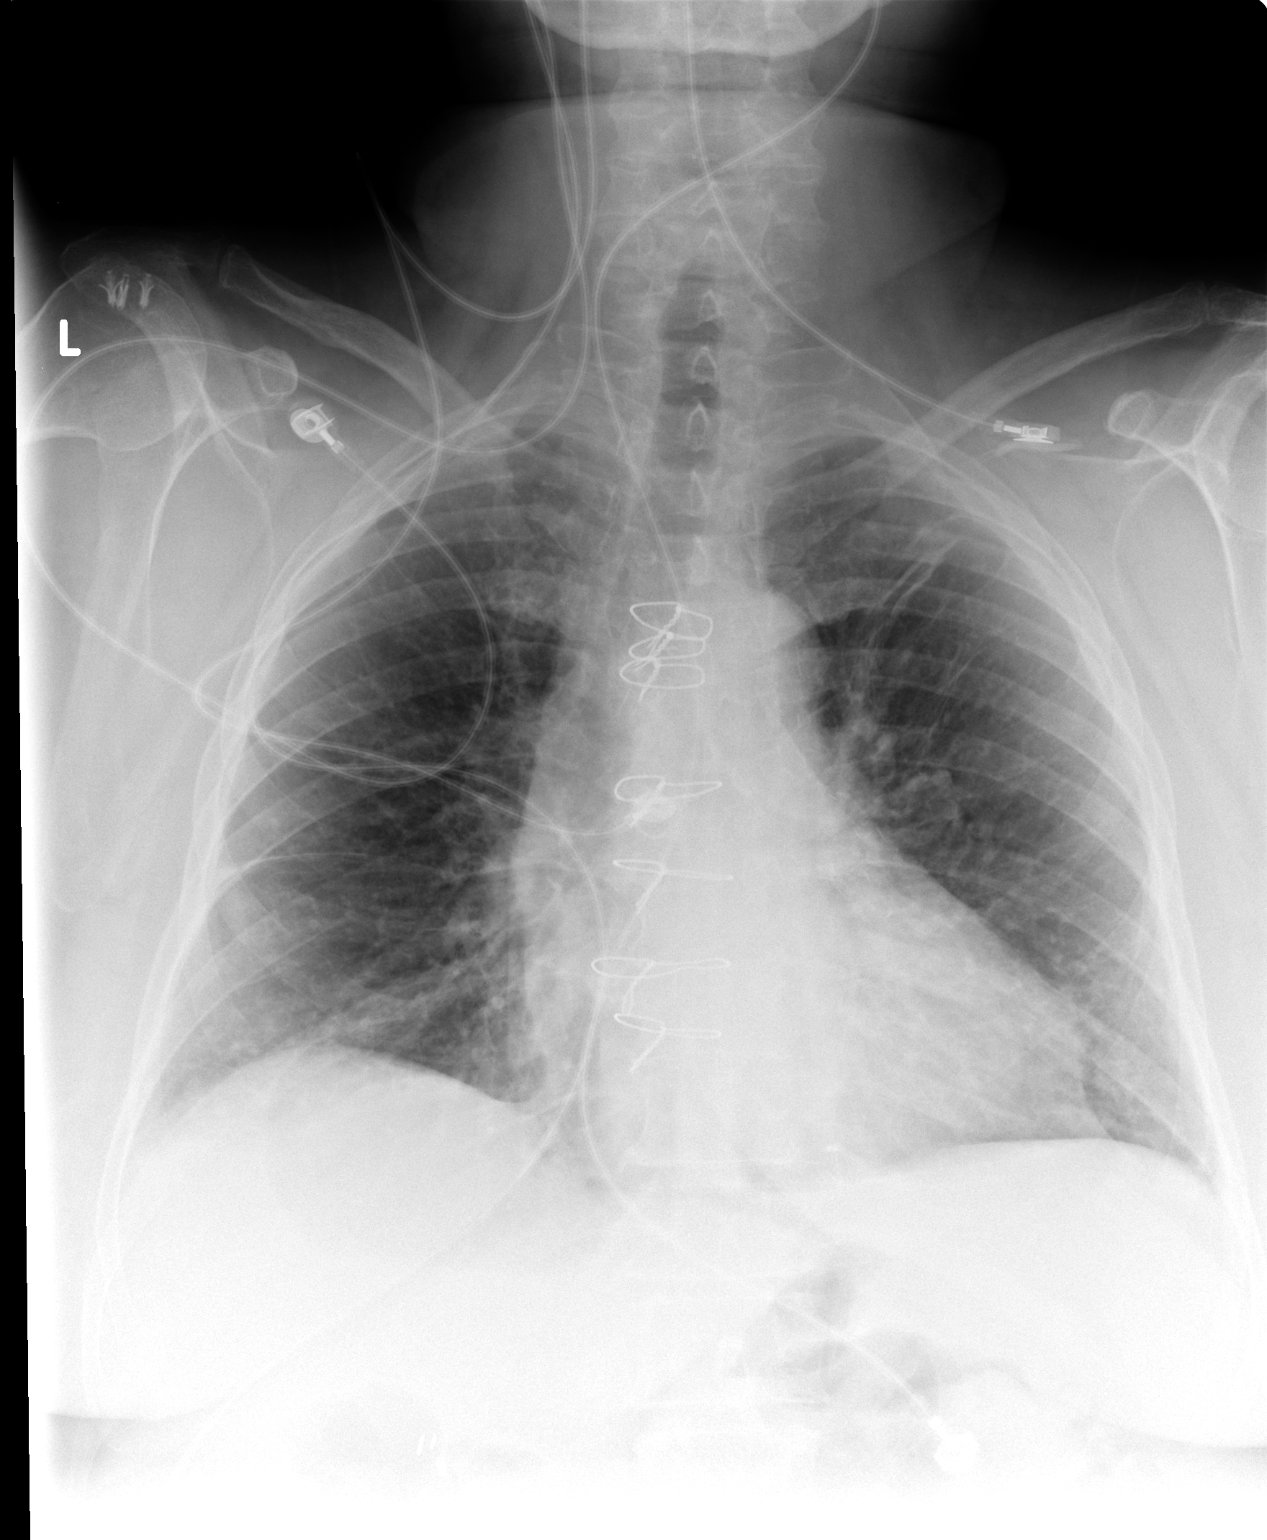

[view not recorded (2 of 2)]
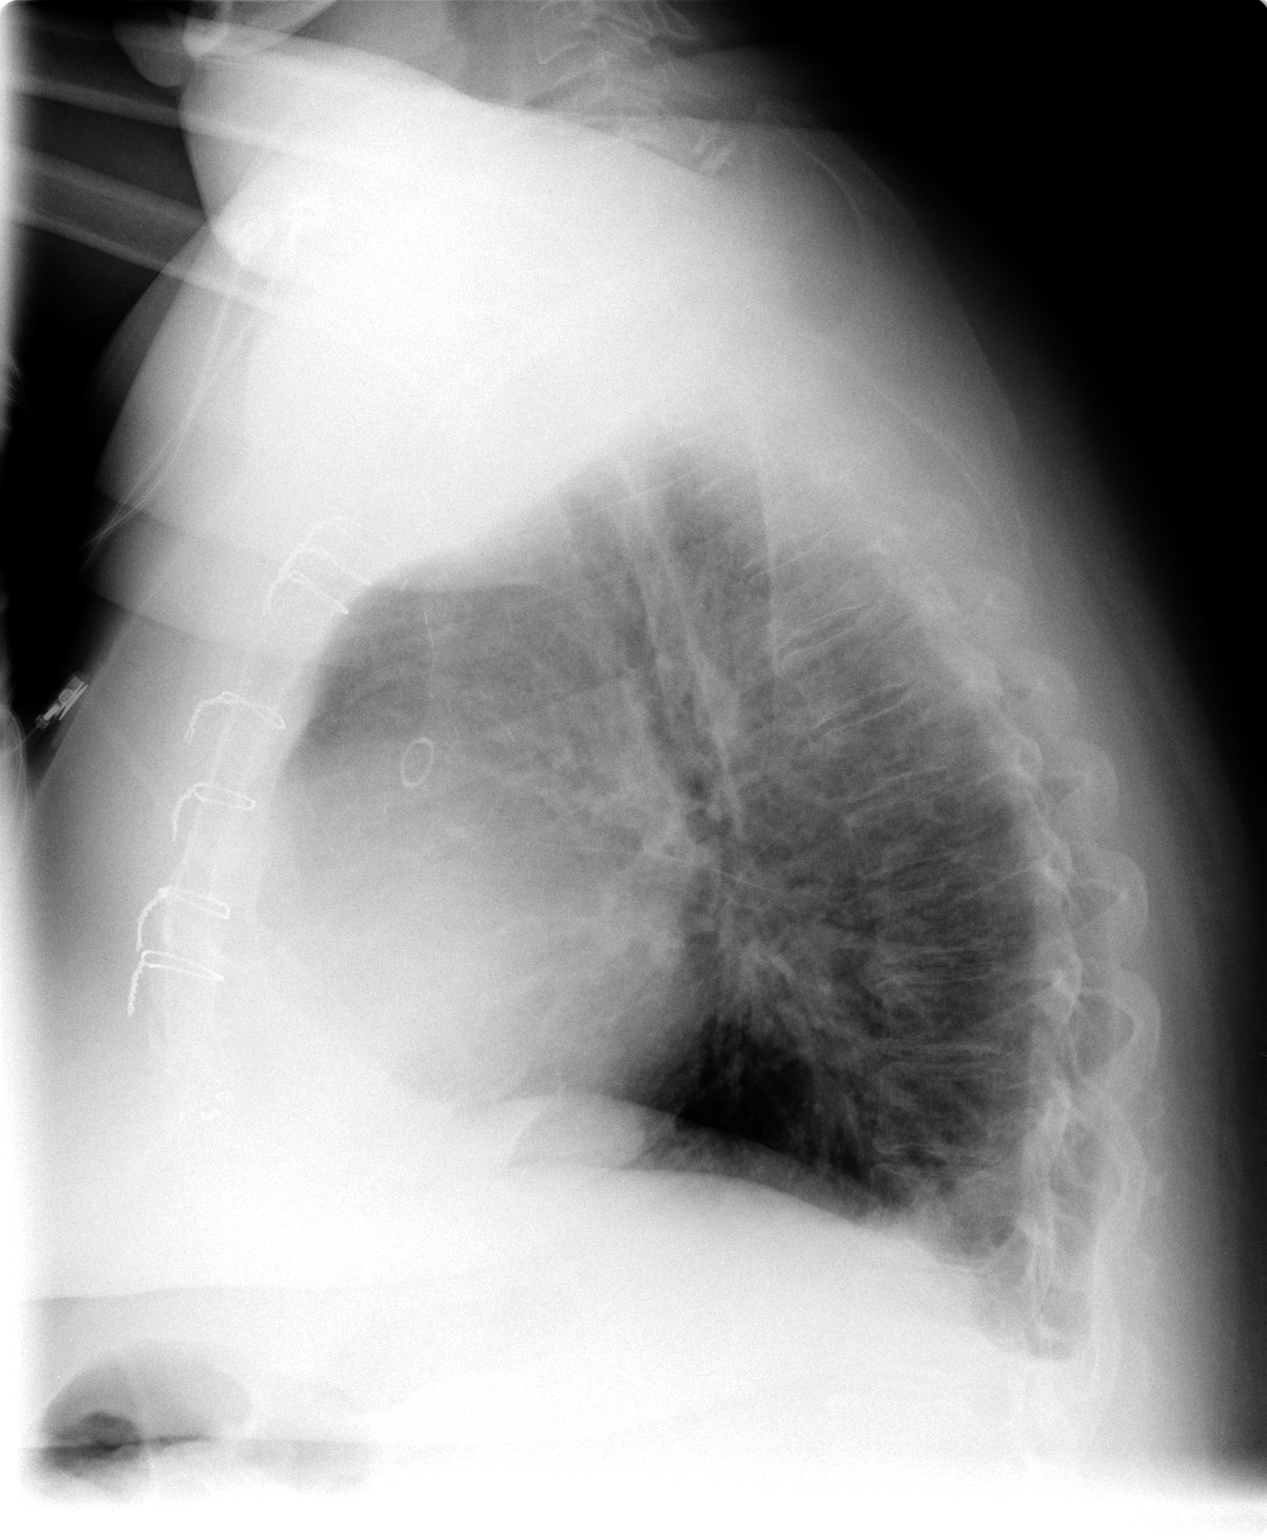

[2 of 2 positions shown; findings below may reference images not displayed]

FINDINGS: The heart is normal in size.  Interstitial infiltrates are now disbursed throughout both lungs worrisome for interstitial edema or pneumonia.  Small bilateral effusions are present.  The heart is borderline enlarged.
IMPRESSION: Interval interstitial infiltrates and small effusions.  Consider pneumonia or edema.

## 2008-09-19 IMAGING — CT CT HEAD W/O CM
1 series · 16 of 30 positions shown, 20 images · IV contrast (agent unspecified)
Comparison: 08/29/03 CT head from [HOSPITAL].

CLINICAL DATA: Headaches and dizziness.  Bronchitis.
 HEAD CT WITHOUT CONTRAST:
TECHNIQUE: Contiguous axial images were obtained from the base of the skull through the vertex according to standard protocol without contrast.

[Series 3328: — · axial · 0.45mm/px · z∈[-623,-488]mm · 16 of 30 slices shown, 20 images]
[im 2/30  brain]
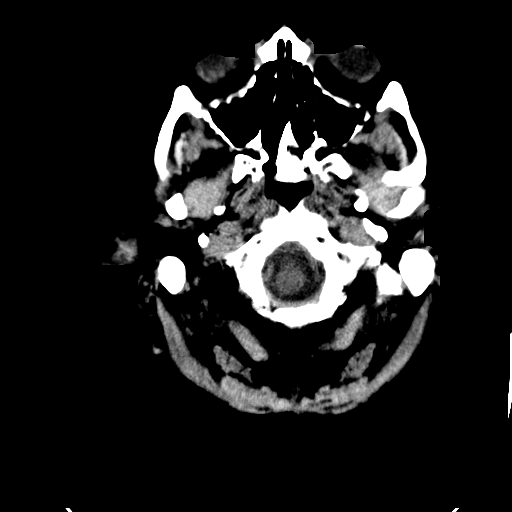
[im 2/30  bone]
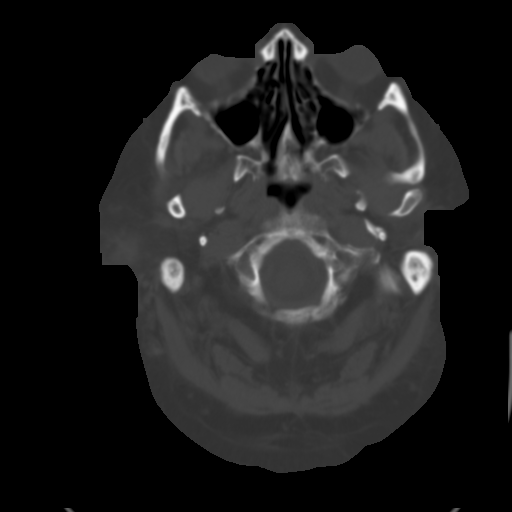
[im 4/30  brain]
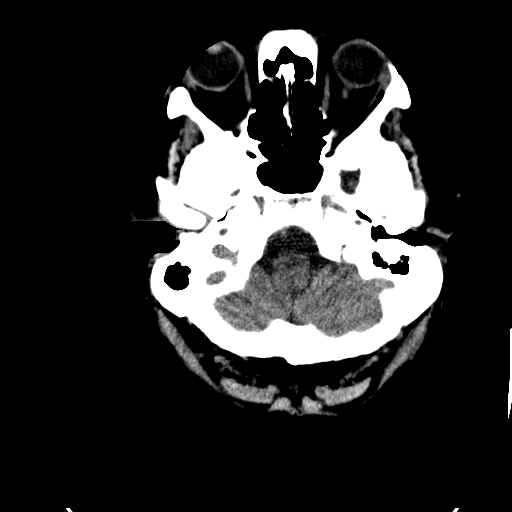
[im 6/30  brain]
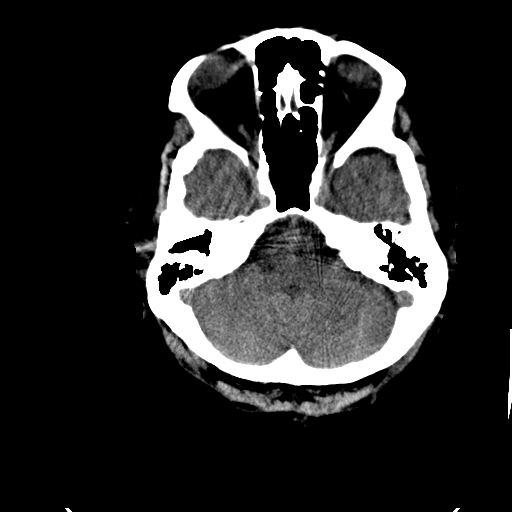
[im 8/30  brain]
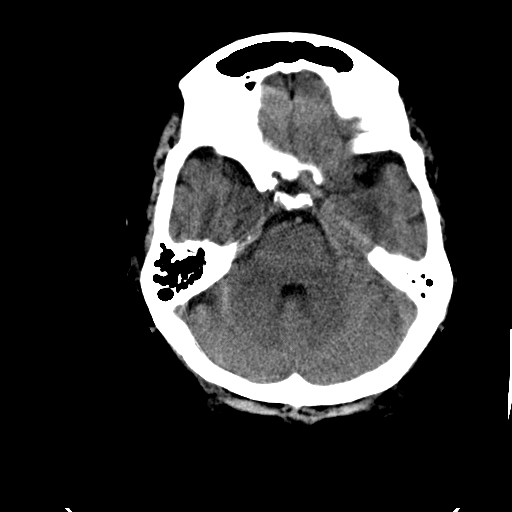
[im 9/30  brain]
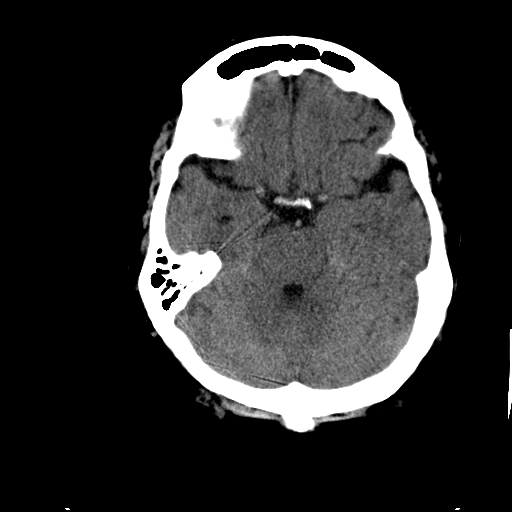
[im 9/30  bone]
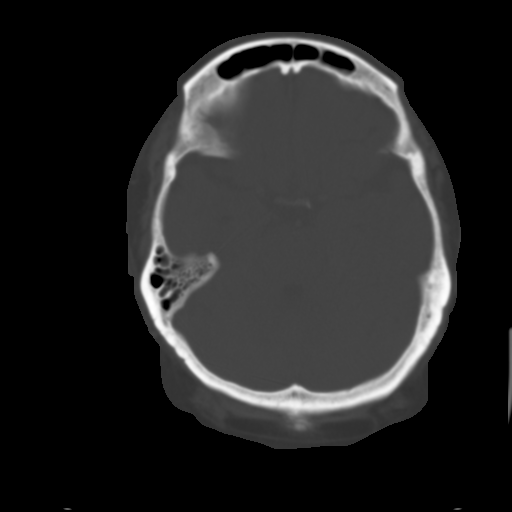
[im 11/30  brain]
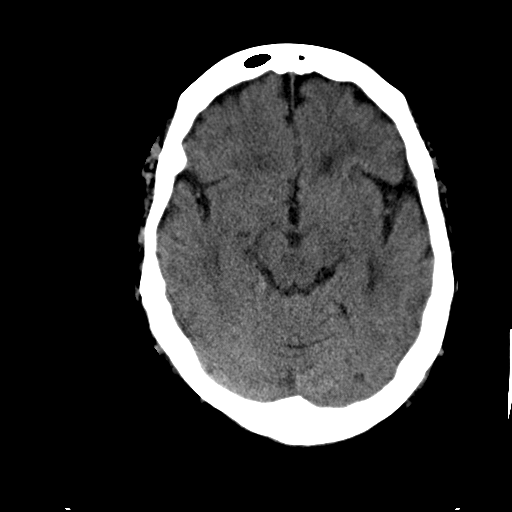
[im 13/30  brain]
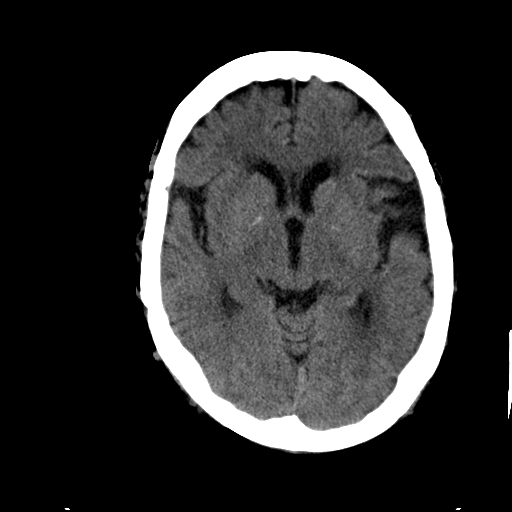
[im 15/30  brain]
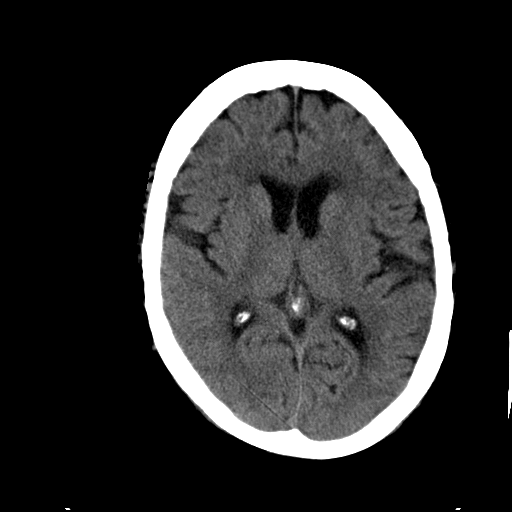
[im 16/30  brain]
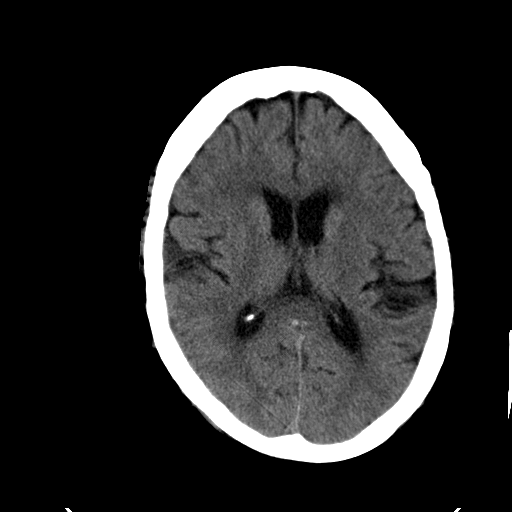
[im 16/30  bone]
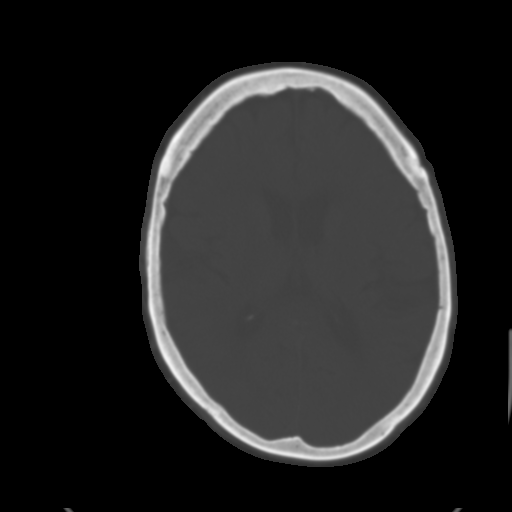
[im 18/30  brain]
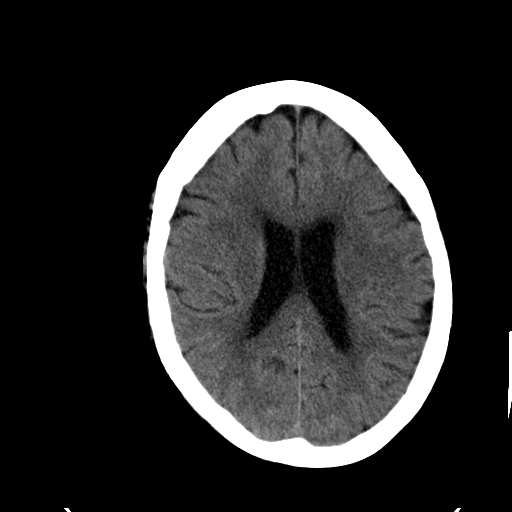
[im 20/30  brain]
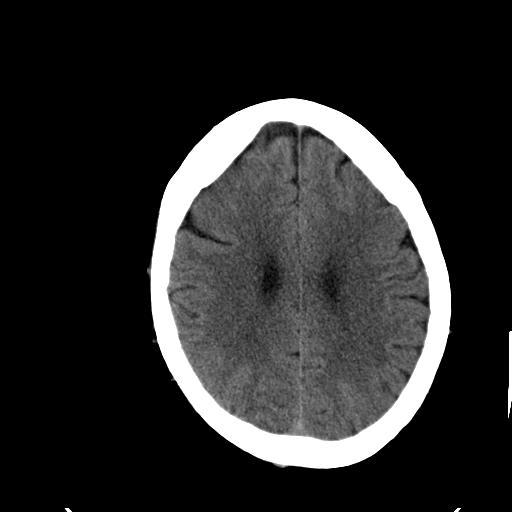
[im 22/30  brain]
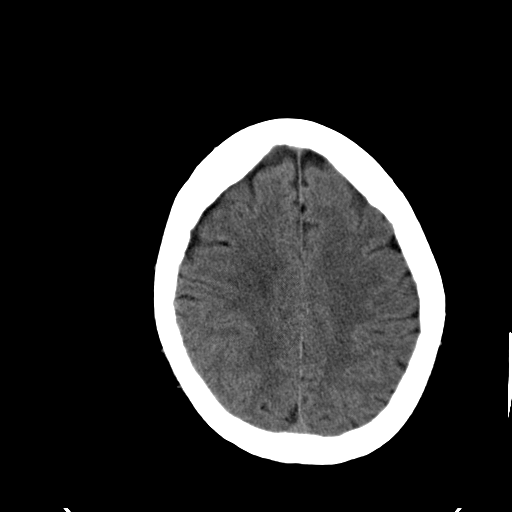
[im 23/30  brain]
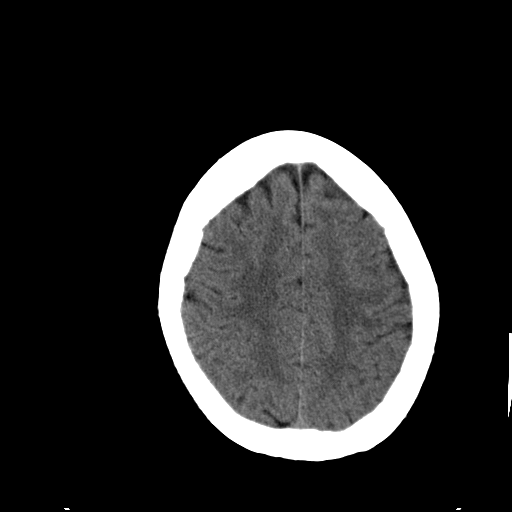
[im 23/30  bone]
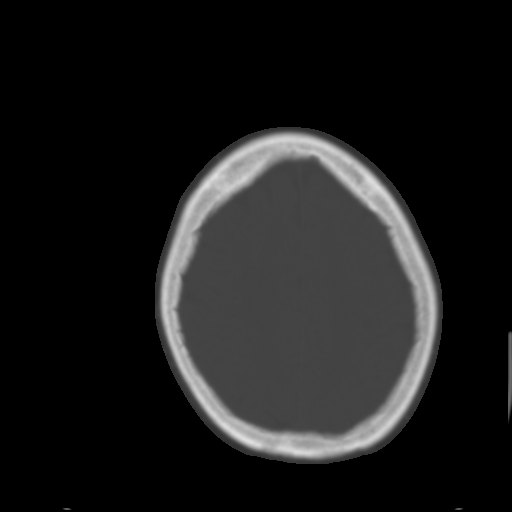
[im 25/30  brain]
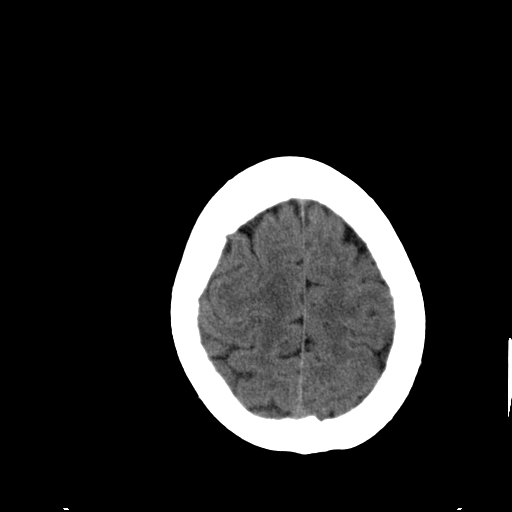
[im 27/30  brain]
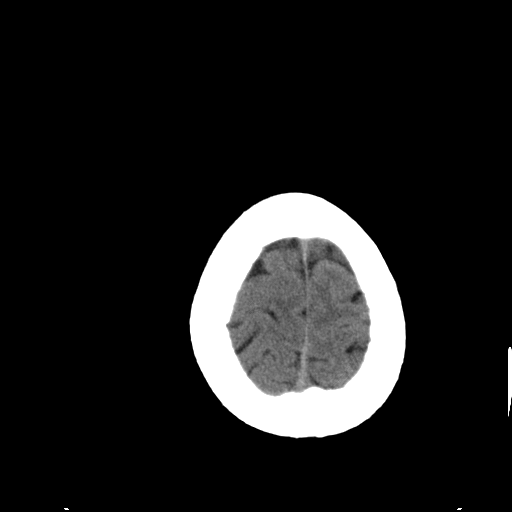
[im 29/30  brain]
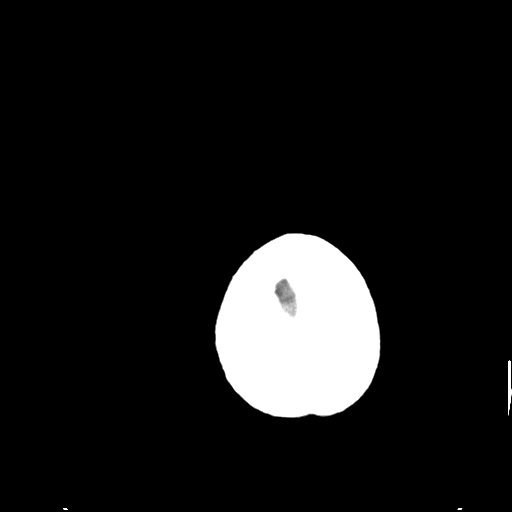

[16 of 30 positions shown; findings below may reference images not displayed]

FINDINGS: There is no evidence of acute intracranial hemorrhage, mass effect or extraaxial fluid collection.  I believe there are mild chronic small vessel ischemic changes in the periventricular white matter which appear stable.  The visualized paranasal sinuses are clear.  Partial opacification of the mastoid air cells on the left appears chronic and unchanged.  No acute calvarial findings are identified.  A small lucent lesion in the right parietal bone on image #26 is stable.
IMPRESSION: 1. Stable CT of the head demonstrating no acute intracranial findings.  There is evidence of mild chronic small vessel ischemic change.
 2. Stable chronic partial mastoid opacification on the left.

## 2008-09-20 ENCOUNTER — Ambulatory Visit (HOSPITAL_COMMUNITY): Admission: RE | Admit: 2008-09-20 | Discharge: 2008-09-22 | Payer: Self-pay | Admitting: Surgery

## 2008-09-20 HISTORY — PX: REPAIR FLEXOR TENDON HAND: SUR1175

## 2008-11-03 ENCOUNTER — Emergency Department (HOSPITAL_COMMUNITY): Admission: EM | Admit: 2008-11-03 | Discharge: 2008-11-03 | Payer: Self-pay | Admitting: Emergency Medicine

## 2009-01-16 ENCOUNTER — Ambulatory Visit (HOSPITAL_COMMUNITY): Admission: RE | Admit: 2009-01-16 | Discharge: 2009-01-16 | Payer: Self-pay | Admitting: Family Medicine

## 2009-02-06 ENCOUNTER — Ambulatory Visit (HOSPITAL_COMMUNITY): Admission: RE | Admit: 2009-02-06 | Discharge: 2009-02-06 | Payer: Self-pay | Admitting: Family Medicine

## 2009-02-22 IMAGING — CR DG CHEST 2V
2 series · 2 of 2 positions shown · non-contrast
Comparison: 09/19/06.

CLINICAL DATA: Flu symptoms and cough.  
 CHEST - 2 VIEW:

[view not recorded (1 of 2)]
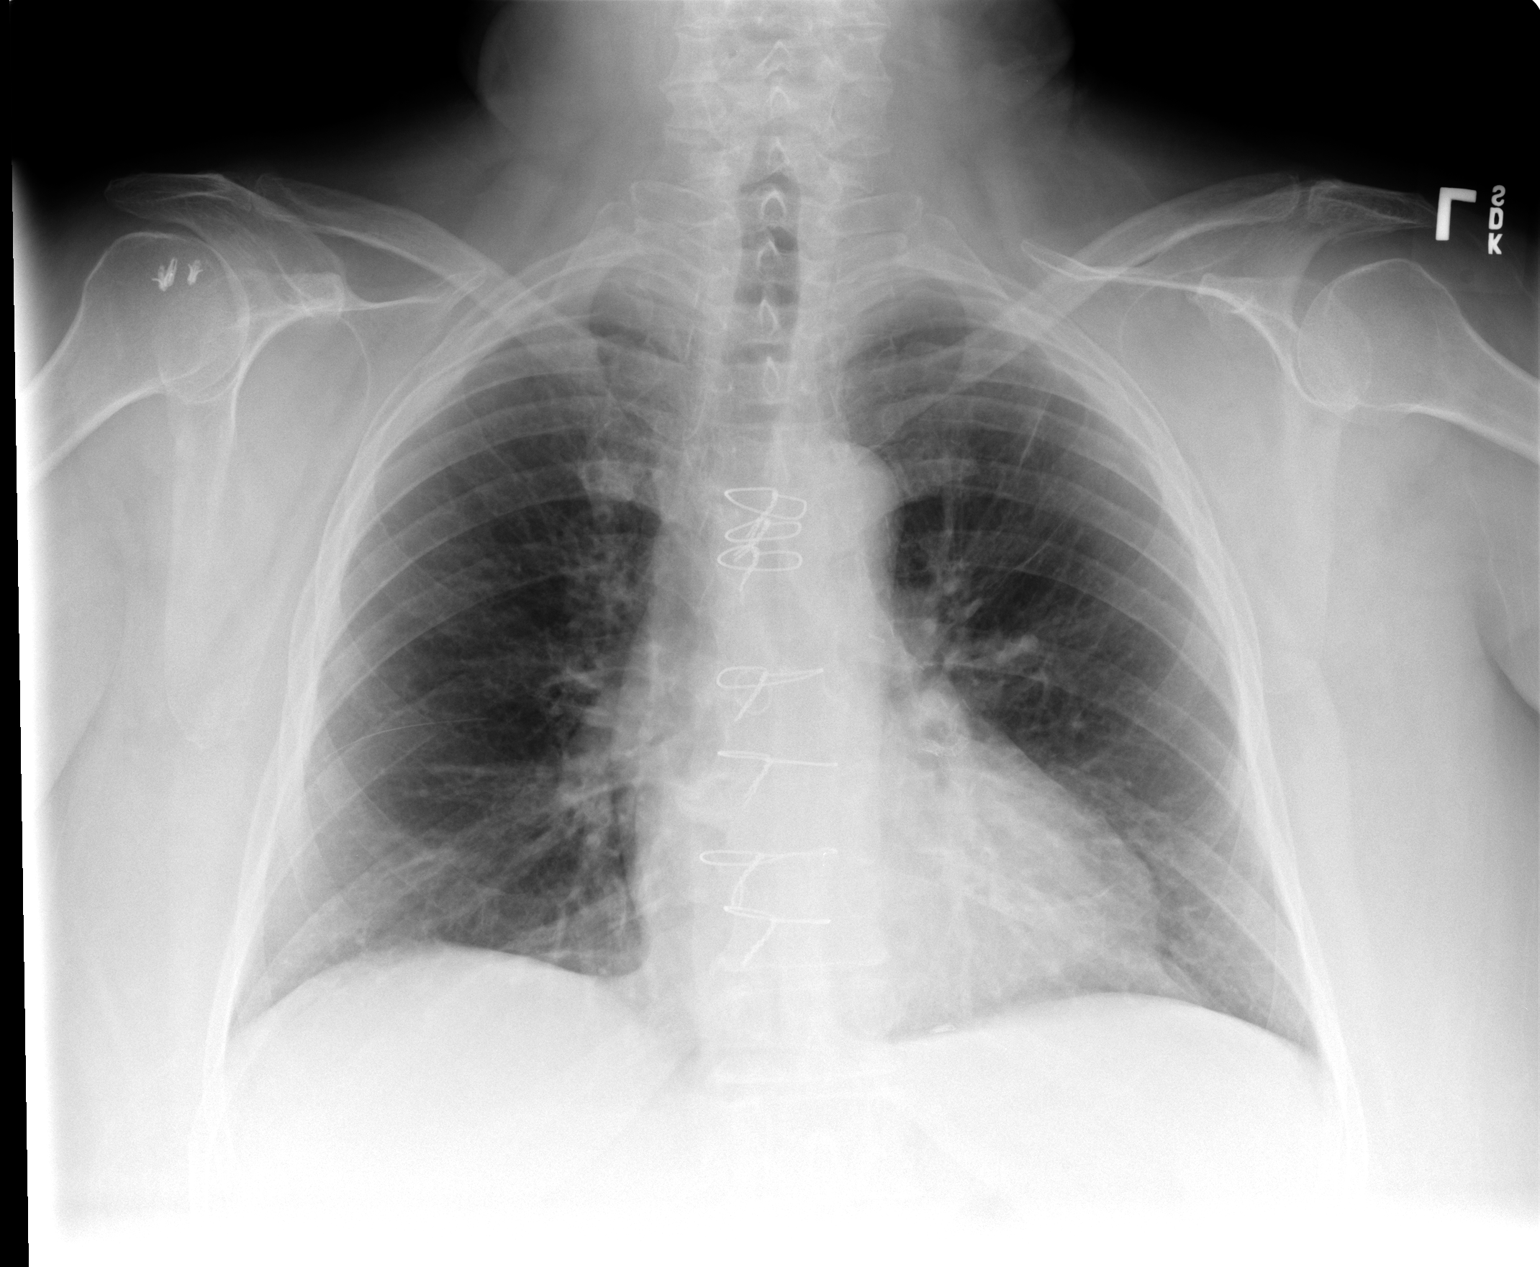

[view not recorded (2 of 2)]
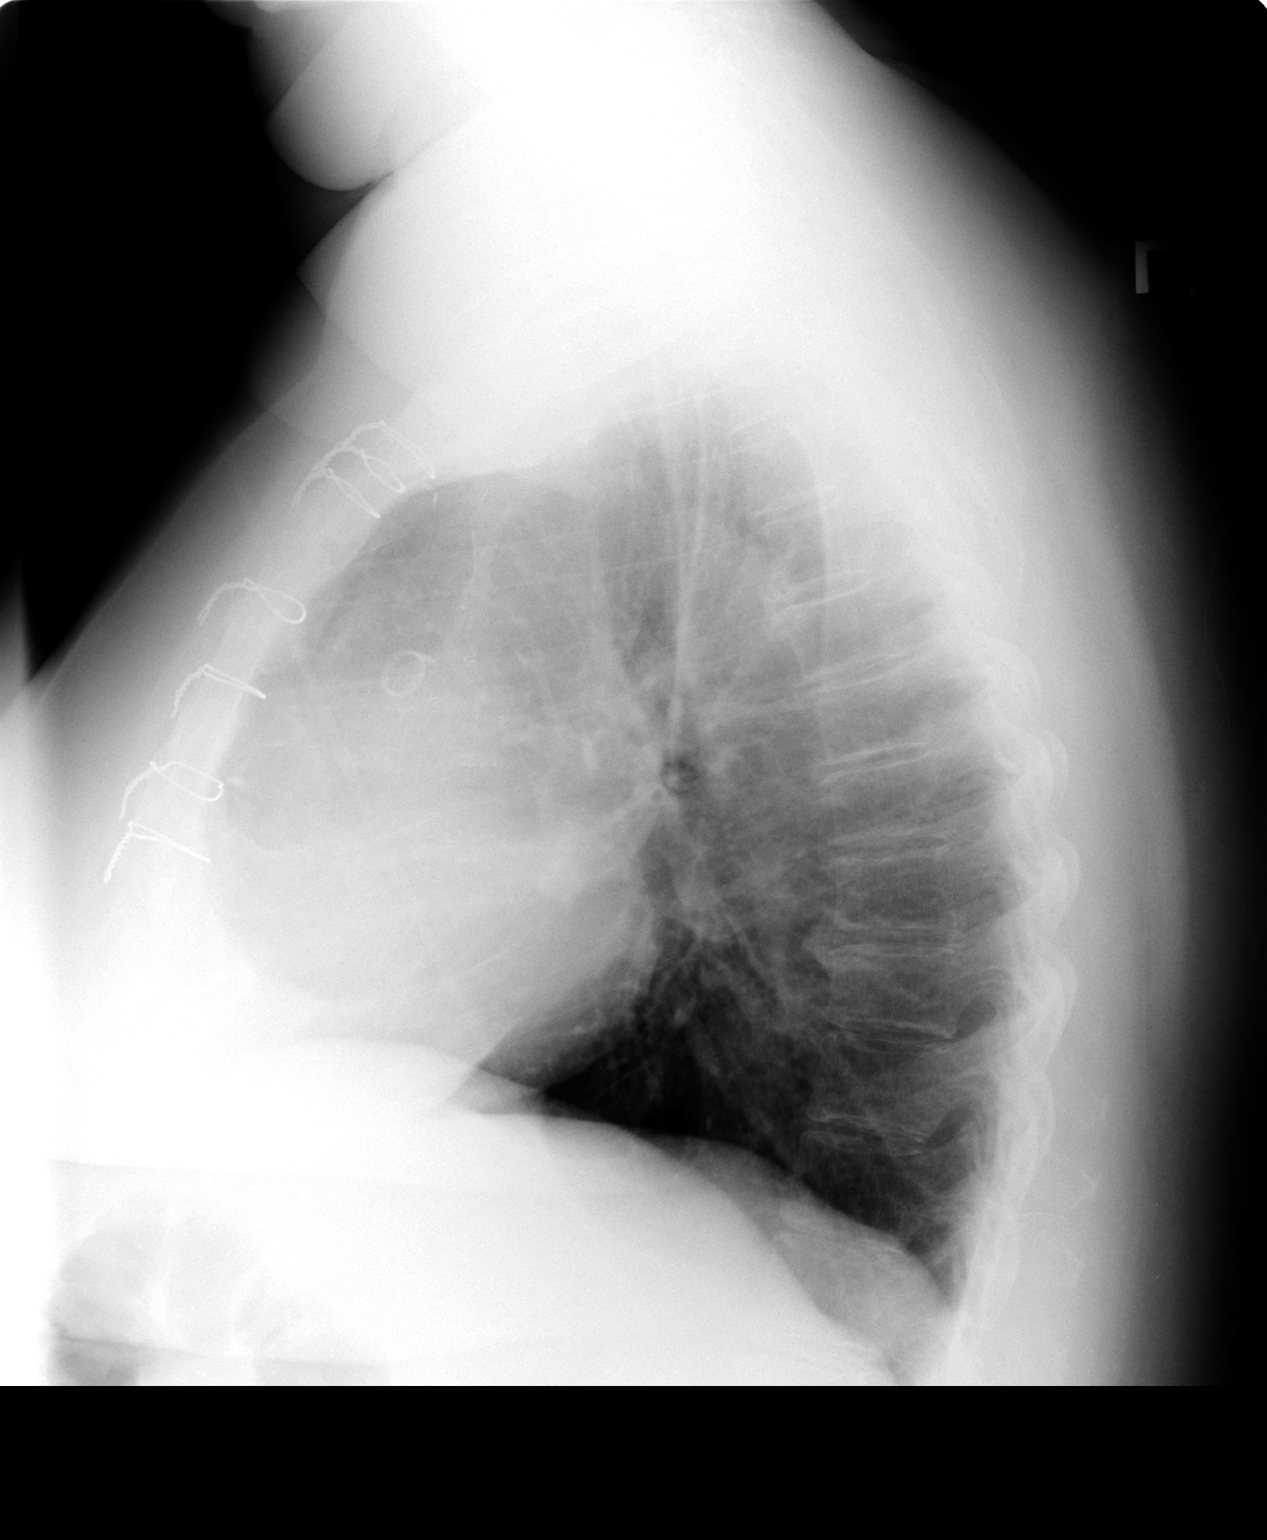

[2 of 2 positions shown; findings below may reference images not displayed]

FINDINGS: Cardiac size within normal limits.  No pulmonary vascular congestion or active lung process.  Mild chronic bronchitic markings.  No infiltrate.
IMPRESSION: Chronic bronchitic markings.  No focal pneumonia.

## 2009-03-19 ENCOUNTER — Emergency Department (HOSPITAL_COMMUNITY): Admission: EM | Admit: 2009-03-19 | Discharge: 2009-03-19 | Payer: Self-pay | Admitting: Emergency Medicine

## 2009-04-08 ENCOUNTER — Emergency Department (HOSPITAL_COMMUNITY): Admission: EM | Admit: 2009-04-08 | Discharge: 2009-04-08 | Payer: Self-pay | Admitting: Emergency Medicine

## 2009-07-07 IMAGING — CT CT ABDOMEN W/O CM
2 of 4 series · 16 of 46 positions shown, 18 images · IV contrast (agent unspecified)
Comparison: None.

CLINICAL DATA: 70 year-old, recurrent UTI.
 ABDOMEN CT WITHOUT CONTRAST:
TECHNIQUE: Multidetector CT imaging of the abdomen was performed following the standard protocol without IV contrast.
TECHNIQUE: Multidetector CT imaging of the pelvis was performed following the standard protocol without IV contrast.

[Series 2: abd|pel w/o 5.0 b40f · axial · non-contrast · 0.80mm/px · z∈[-492,-52]mm · 13 of 98 slices shown, 15 images]
[im 5/98  soft-tissue]
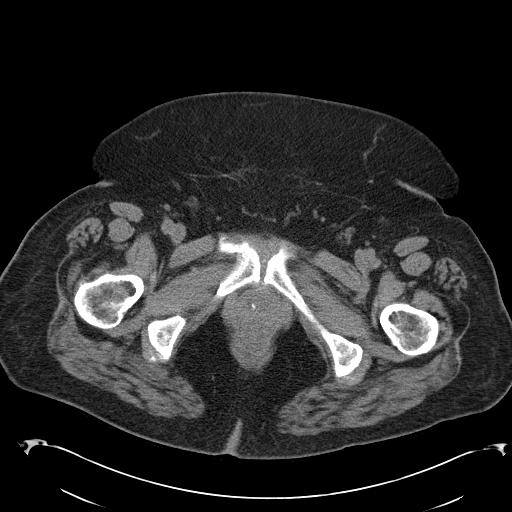
[im 5/98  bone]
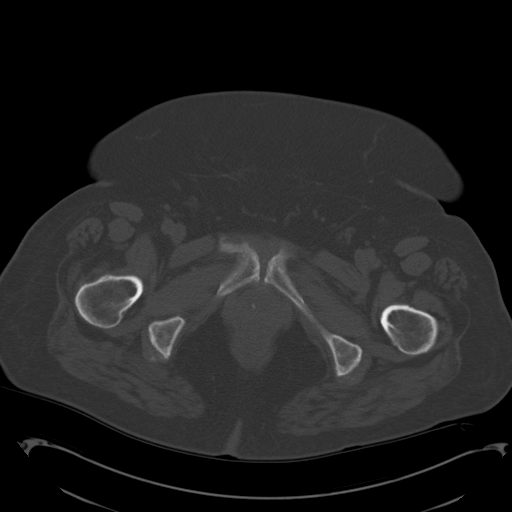
[im 13/98  soft-tissue]
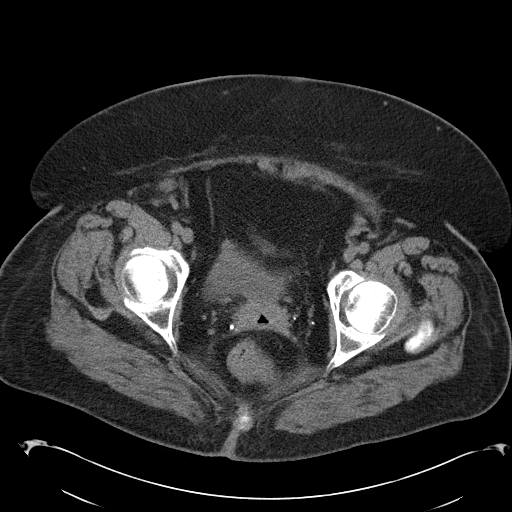
[im 22/98  soft-tissue]
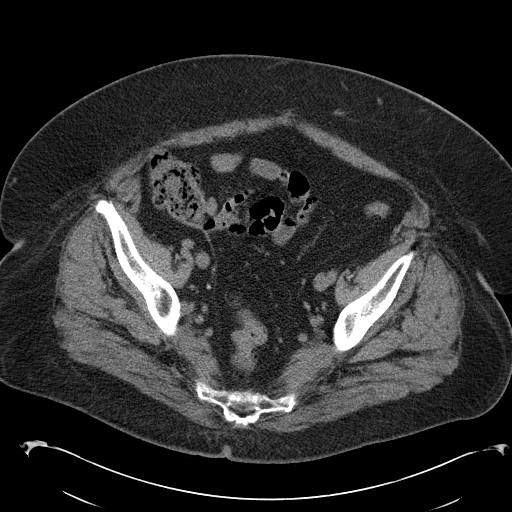
[im 26/98  soft-tissue]
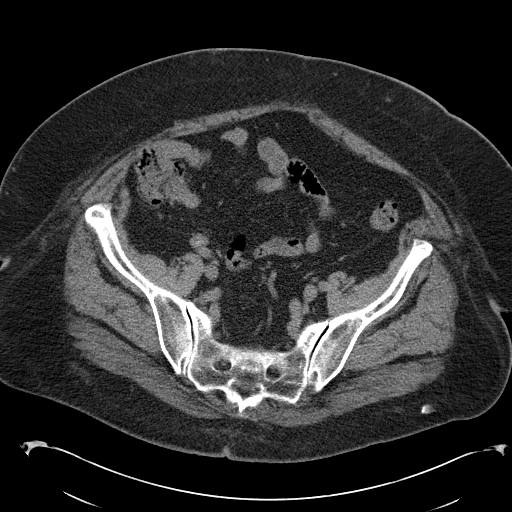
[im 34/98  soft-tissue]
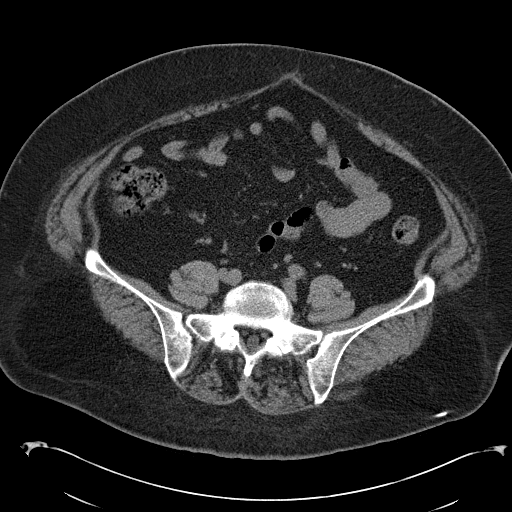
[im 43/98  soft-tissue]
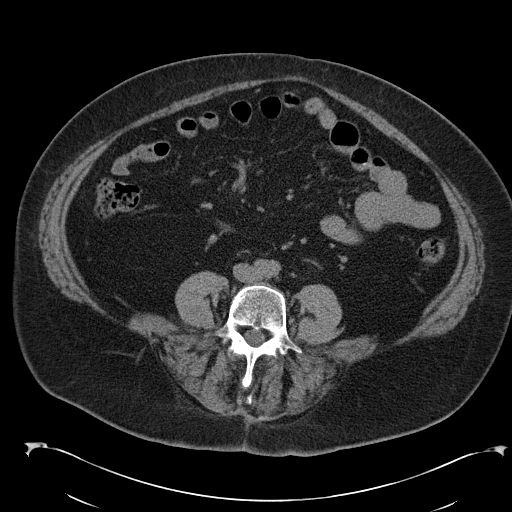
[im 51/98  soft-tissue]
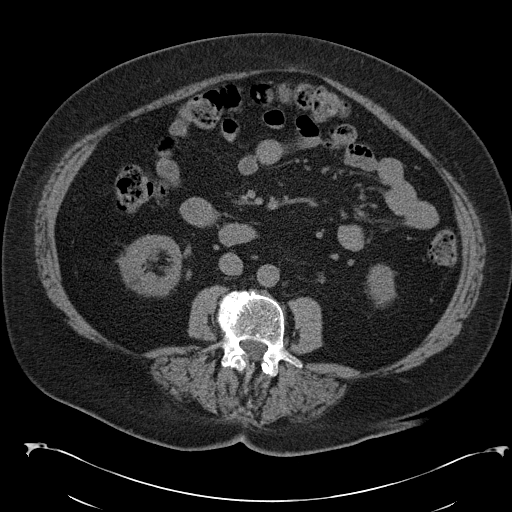
[im 55/98  soft-tissue]
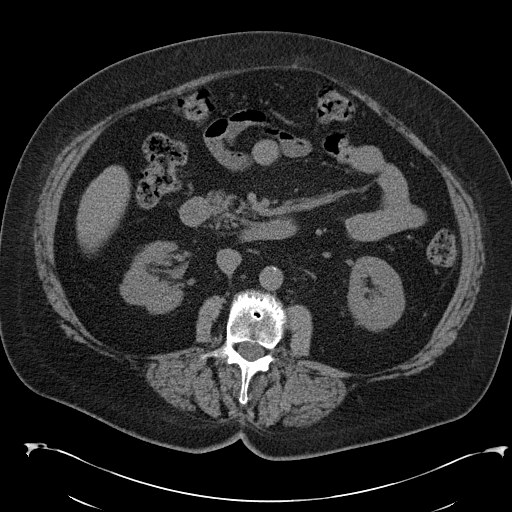
[im 64/98  soft-tissue]
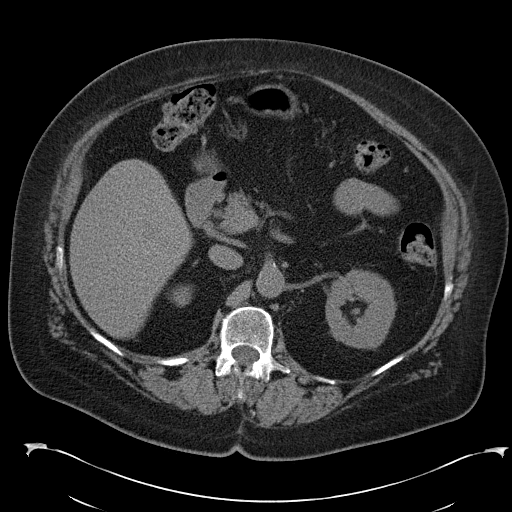
[im 64/98  bone]
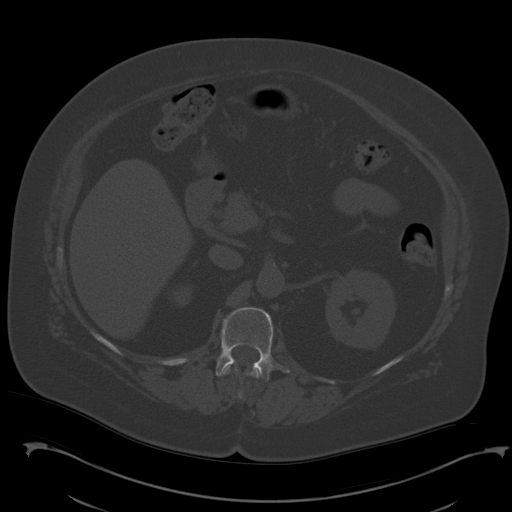
[im 72/98  soft-tissue]
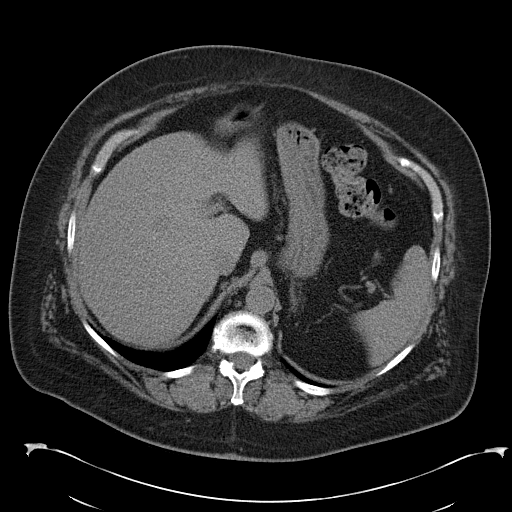
[im 76/98  soft-tissue]
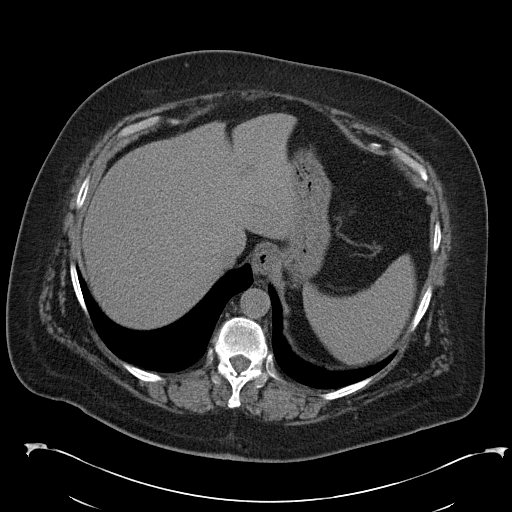
[im 85/98  soft-tissue]
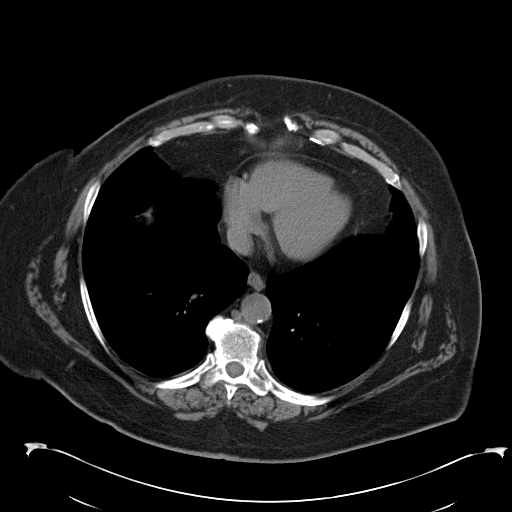
[im 93/98  soft-tissue]
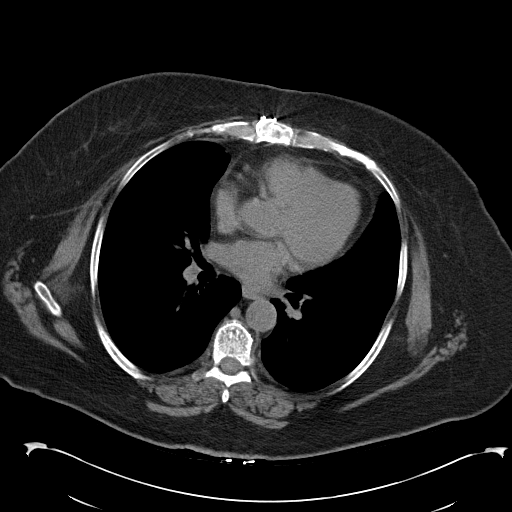

[Series 4: mpr coronal cor · coronal · 0.86mm/px · 3 of 97 slices shown]
[im 33/97  soft-tissue]
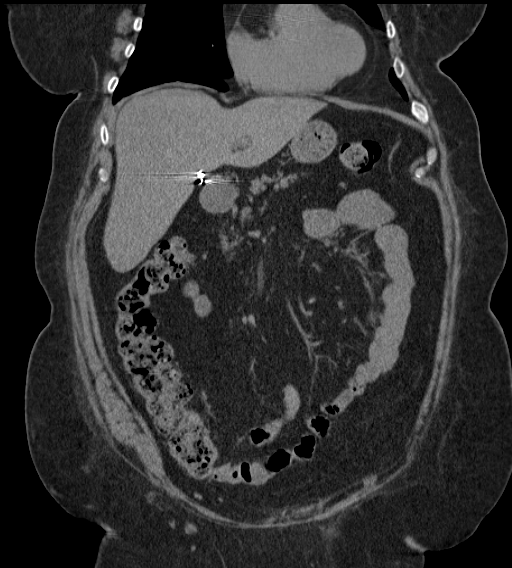
[im 43/97  soft-tissue]
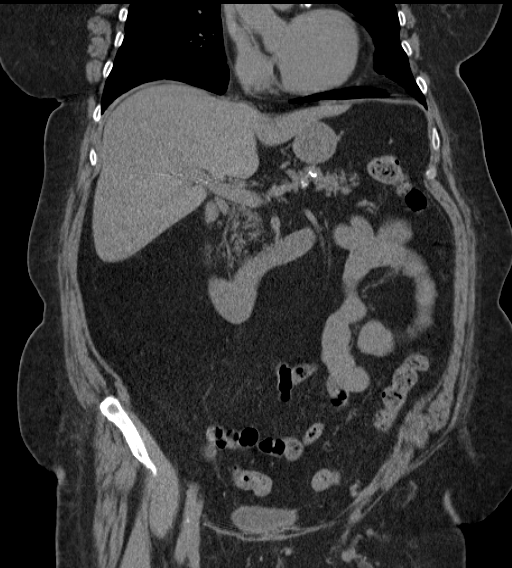
[im 54/97  soft-tissue]
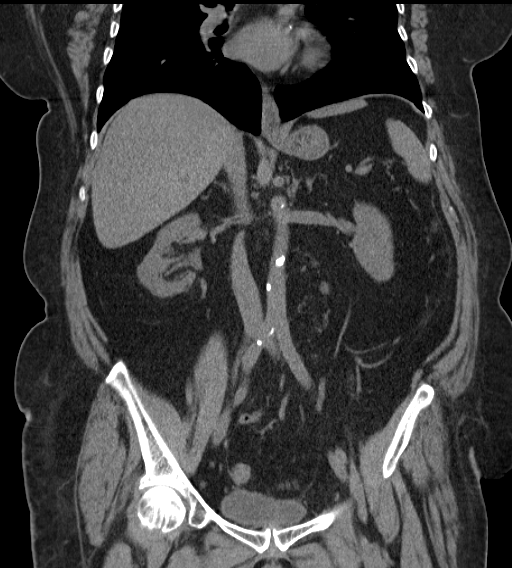

[16 of 46 positions shown; findings below may reference images not displayed]

FINDINGS: The lung bases are clear of acute pulmonary findings.  Dense coronary artery calcifications are noted.  There is a small pulmonary nodule in the right middle lobe on image #5 which measures approximately 4 mm.  I would suggest a follow-up noncontrast chest CT in 6 months to reevaluate this. The distal esophagus is unremarkable. There is a small hiatal hernia.  
 The patient has had a cholecystectomy.  The unenhanced appearance of the liver and spleen are unremarkable. The spleen is normal in size.  The pancreas is unremarkable. Splenic artery calcifications are noted.  The adrenal glands and kidneys are normal. No renal calculi or evidence for renal obstructive process. The upper ureters are normal in caliber. There is a small cleft in the lower pole region of the right kidney. The aorta is normal in caliber. Major branch vessels are normal except for minimal atherosclerotic calcifications. No aneurysm.
 No mesenteric or retroperitoneal masses or adenopathy.
 The stomach, duodenum, small bowel and colon are grossly normal. The study is limited without oral contrast. No significant bony findings. Degenerative changes throughout the spine.
IMPRESSION: 1.  Unremarkable CT examination of the abdomen. No evidence for renal calculi or renal obstructive process.
 2.  Status post cholecystectomy.
 3.  Atherosclerotic calcifications but no aneurysm.
 4.  Small hiatal hernia.
 PELVIS CT WITHOUT CONTRAST:
FINDINGS: No ureteral calculi. No bladder calculi. The patient has had a hysterectomy. The rectum, sigmoid colon, and visualized small bowel loops are unremarkable. No pelvic masses, adenopathy, or free pelvic fluid collections. No significant bony findings.
IMPRESSION: 1.  Unremarkable CT pelvis. No acute pelvic findings, masses or adenopathy.  No ureteral calculi.

## 2009-08-01 ENCOUNTER — Ambulatory Visit (HOSPITAL_COMMUNITY): Admission: RE | Admit: 2009-08-01 | Discharge: 2009-08-01 | Payer: Self-pay | Admitting: Family Medicine

## 2009-09-19 IMAGING — CT CT HEAD W/O CM
1 series · 16 of 30 positions shown, 20 images · non-contrast
Comparison: 09/15/2006

CLINICAL DATA: Left facial and body numbness. Poor balance.

HEAD CT WITHOUT CONTRAST
TECHNIQUE: 5mm collimated images were obtained from the base of the skull
through the vertex according to standard protocol without contrast.

[Series 2: headseq 4.8 h37s · axial · 0.44mm/px · z∈[+161,+298]mm · 16 of 30 slices shown, 20 images]
[im 2/30  brain]
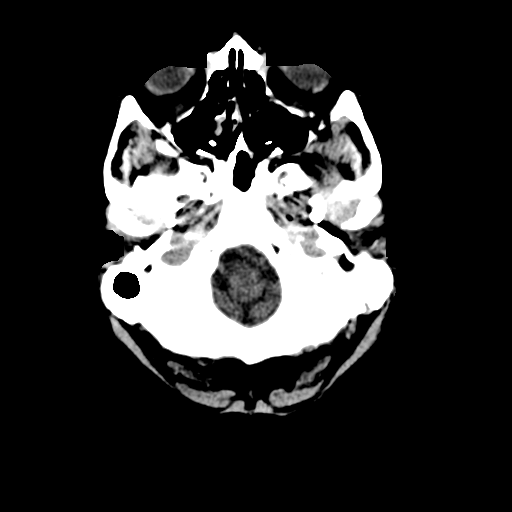
[im 2/30  bone]
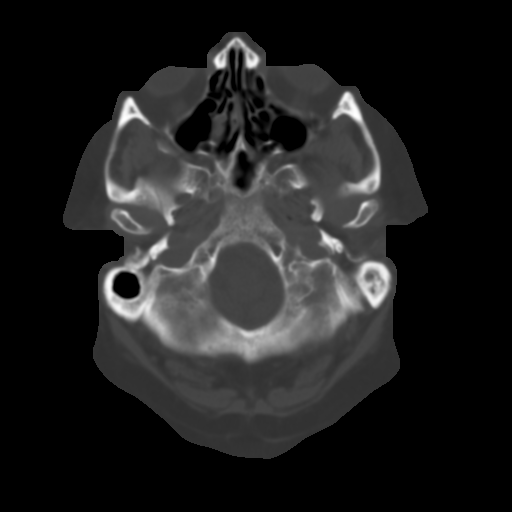
[im 4/30  brain]
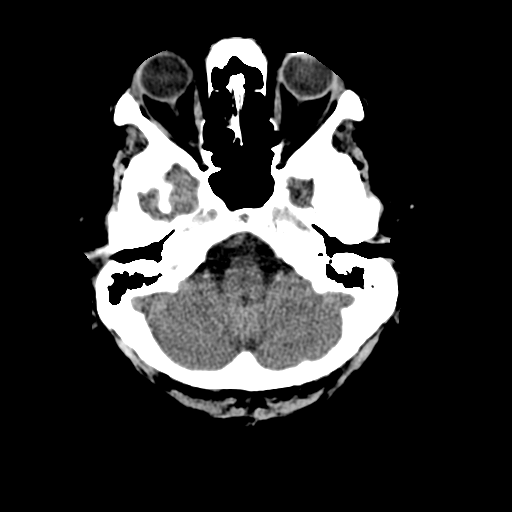
[im 6/30  brain]
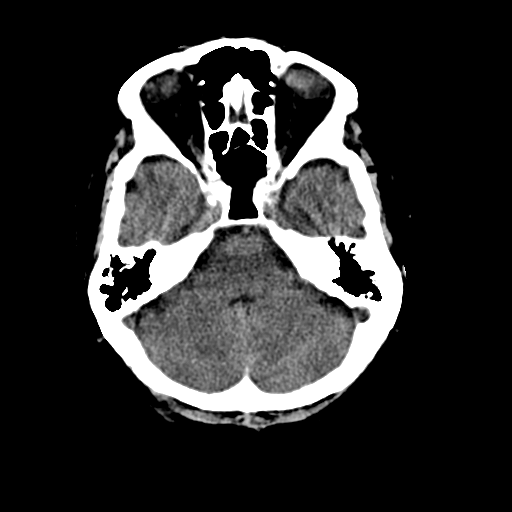
[im 8/30  brain]
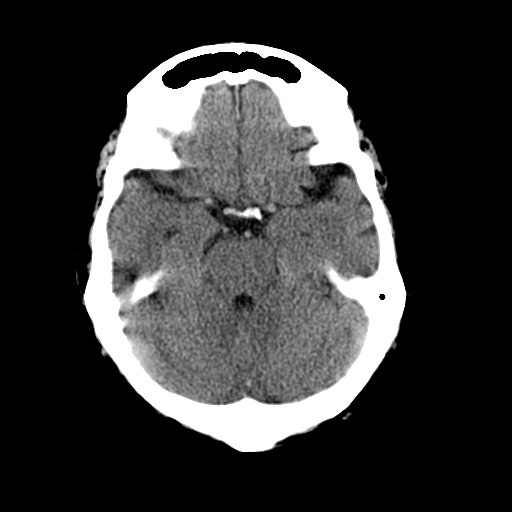
[im 9/30  brain]
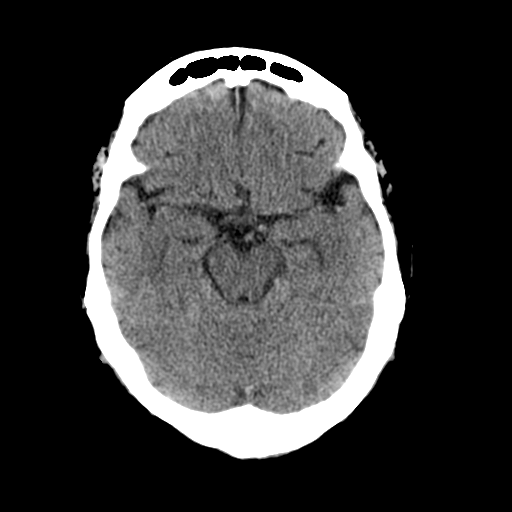
[im 9/30  bone]
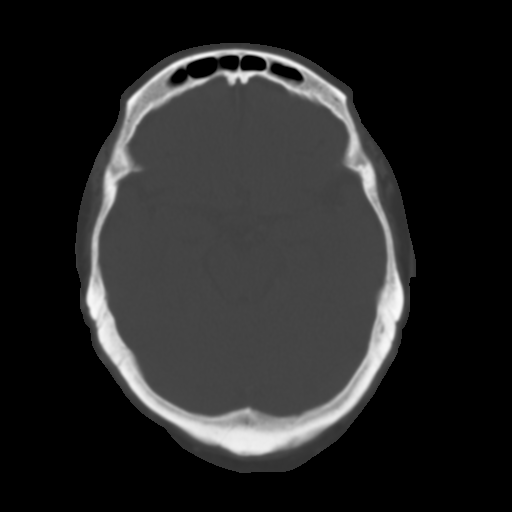
[im 11/30  brain]
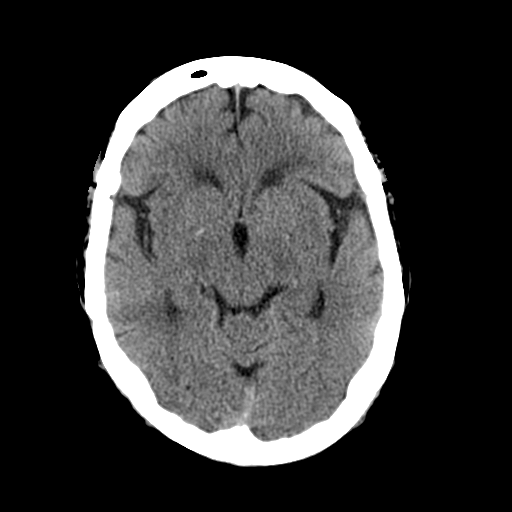
[im 13/30  brain]
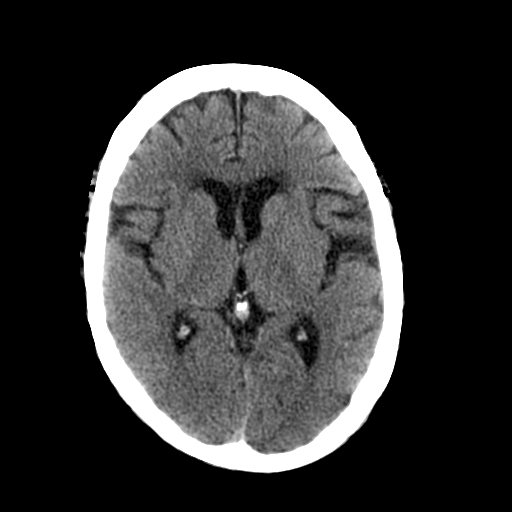
[im 15/30  brain]
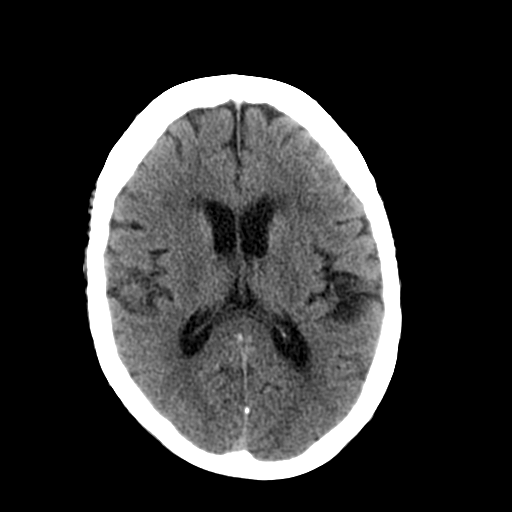
[im 16/30  brain]
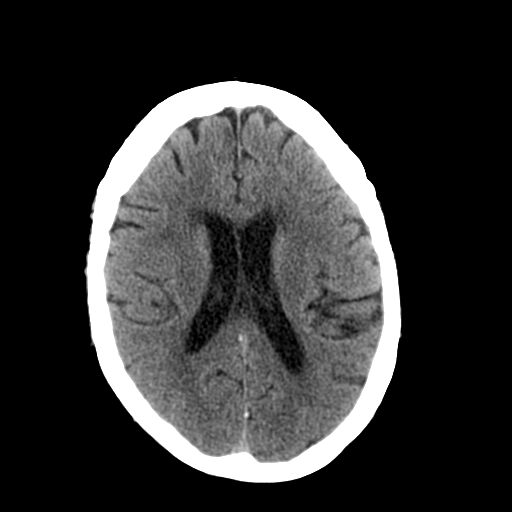
[im 16/30  bone]
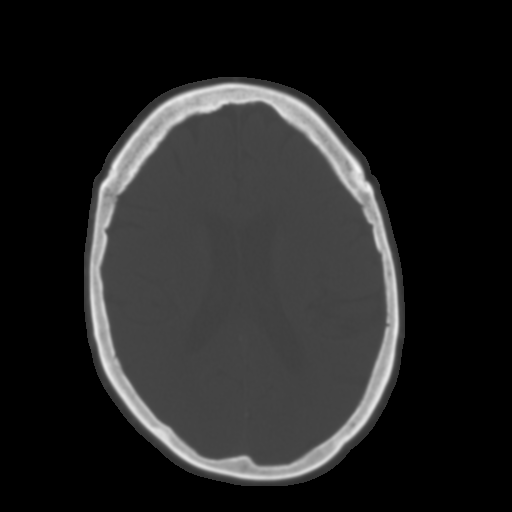
[im 18/30  brain]
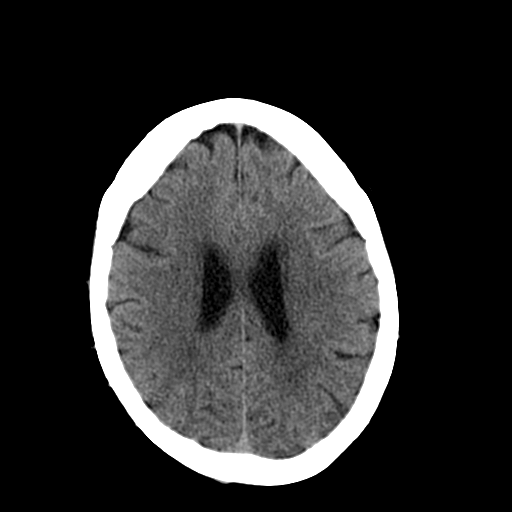
[im 20/30  brain]
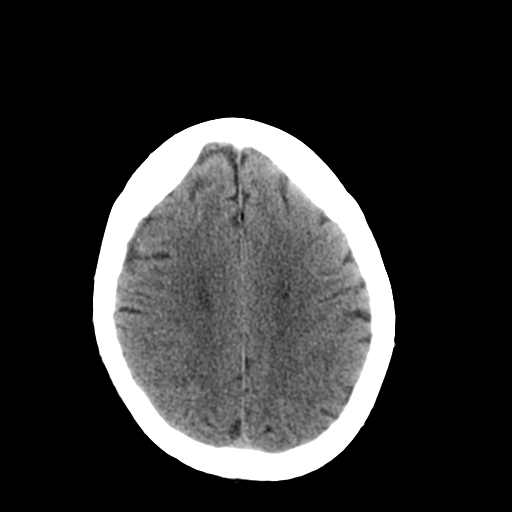
[im 22/30  brain]
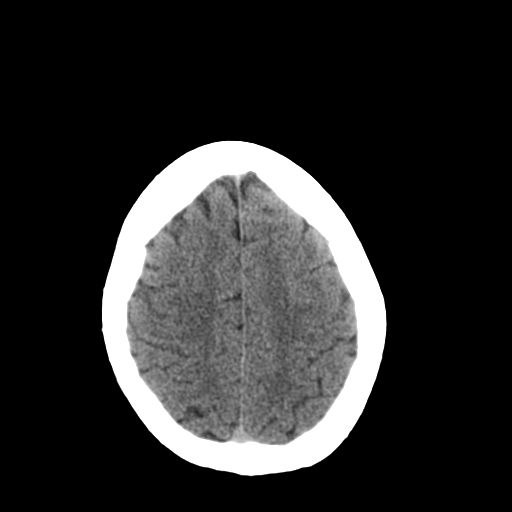
[im 23/30  brain]
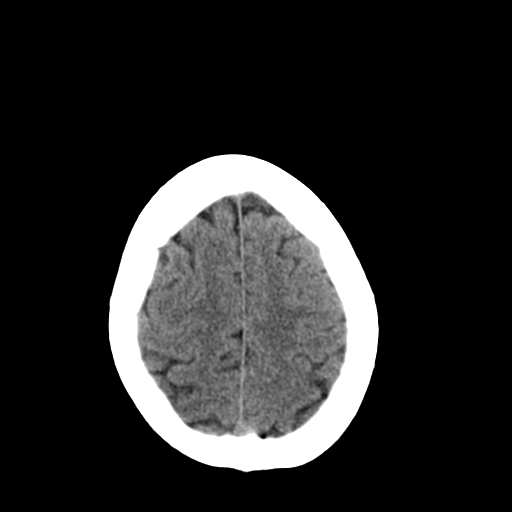
[im 23/30  bone]
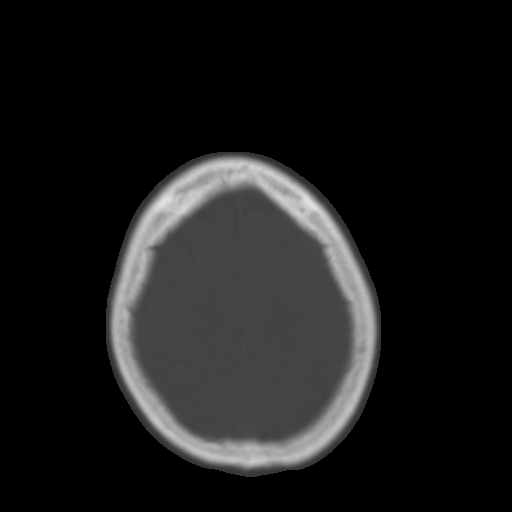
[im 25/30  brain]
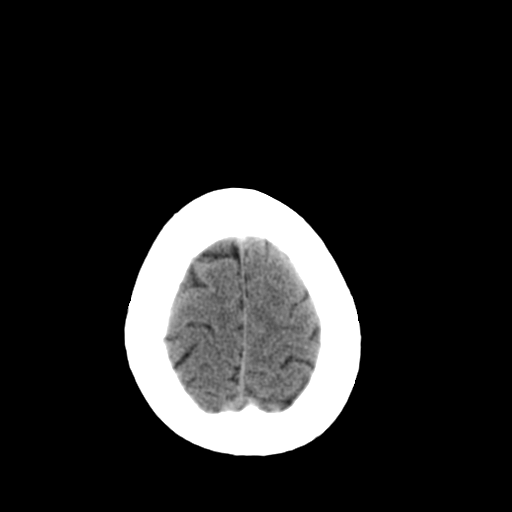
[im 27/30  brain]
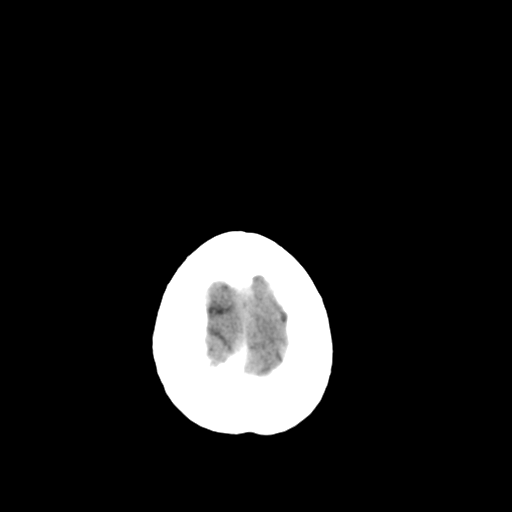
[im 29/30  brain]
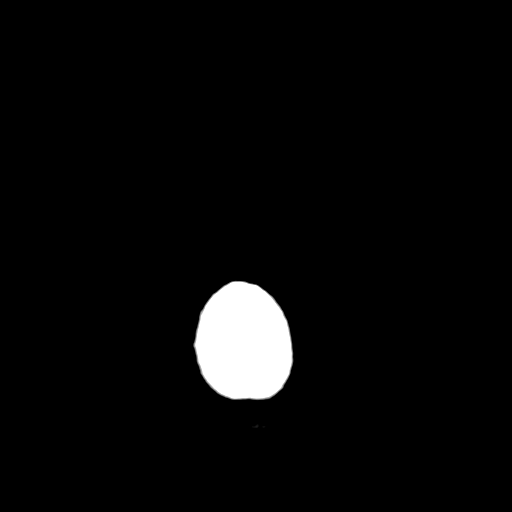

[16 of 30 positions shown; findings below may reference images not displayed]

FINDINGS: Periventricular white matter hypodensity is compatible with chronic
ischemic microvascular white matter disease. The basilar cisterns appear patent
no hydrocephalus is evident. No intracranial hemorrhage, acute CVA, or mass
lesion is identified. No significant abnormal extra-axial fluid collection
noted. There is chronic opacification of several mastoid air cells on the left.

IMPRESSION

1. Stable CT appearance of the head. No acute intracranial findings.
2. Stable chronic partial mastoid opacification on the left.

## 2009-09-20 IMAGING — MR MR HEAD WO/W CM
11 of 14 series · 27 of 48 positions shown · IV contrast (magnevist)
Comparison: CT head 09/15/07 and earlier MRI brain.

CLINICAL DATA: 71-year-old female with altered mental status, slurred speech, difficulty walking, nausea and vomiting.  
 MRI BRAIN WITHOUT AND WITH CONTRAST:
TECHNIQUE: Multiplanar and multiecho pulse sequences of the brain and surrounding structures were obtained according to standard protocol before and after administration of intravenous contrast.
 Contrast:  20 ml Magnevist.
TECHNIQUE: 2-D and 3-D time-of-flight pulse sequences were performed to examine the cranial vasculature from the aortic arch to the circle of Willis, centered at the carotid bifurcation, before and during bolus injection of intravenous contrast.  Multiplanar MR image reconstructions were generated to evaluate the vascular anatomy.
TECHNIQUE: 3-D time of flight pulse sequence was performed to examine the cerebral vasculature, centered at the circle of Willis, without IV contrast.  Multiplanar MR image reconstructions were generated to evaluate the vascular anatomy.

[Series 2: T1 · sagittal · 5.0mm · 0.47mm/px · 1 of 12 slices shown (1 of 2)]
[im 1/12]
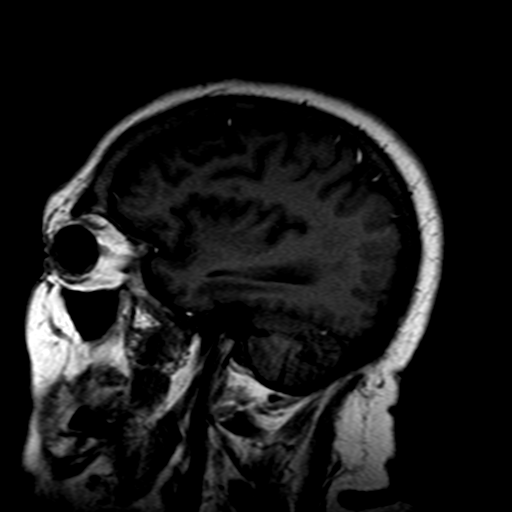

[Series 3: DWI · axial · 5.0mm · 0.86mm/px · 1 of 27 slices shown (1 of 2)]
[im 1/27]
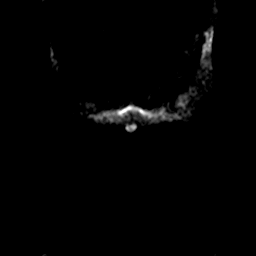

[Series 4: DWI · axial · 5.0mm · 0.86mm/px · z∈[-57,+86]mm · 2 of 27 slices shown (2 of 2)]
[im 1/27]
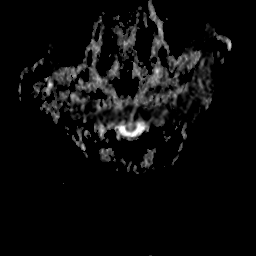
[im 27/27]
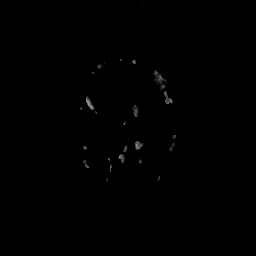

[Series 5: MRA · axial · 0.8mm · 0.23mm/px · 1 of 118 slices shown]
[im 1/118]
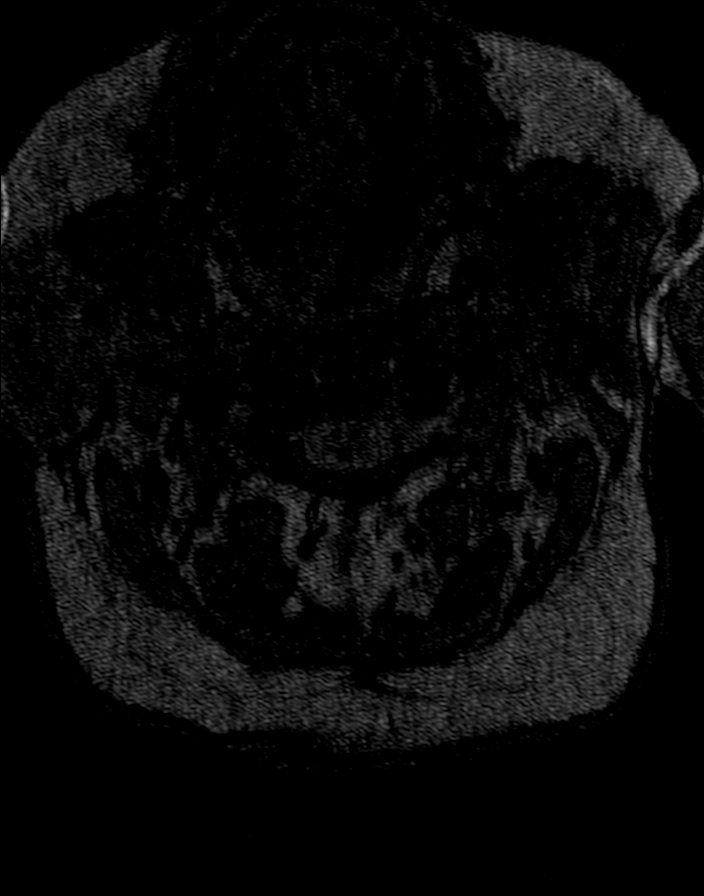

[Series 8: FLAIR · axial · 5.0mm · 0.94mm/px · z∈[-52,+86]mm · 2 of 24 slices shown (1 of 2)]
[im 1/24]
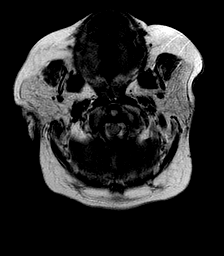
[im 24/24]
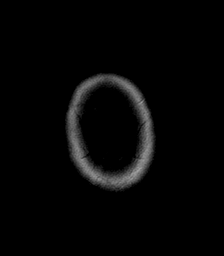

[Series 14: T2 · axial · 5.0mm · 0.62mm/px · z∈[-52,+86]mm · 2 of 24 slices shown (1 of 2)]
[im 1/24]
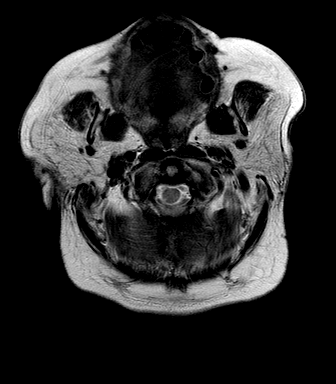
[im 24/24]
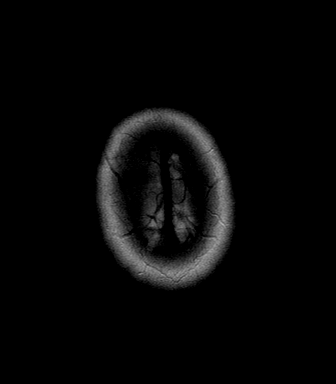

[Series 15: T1 · axial · 2.0mm · 0.55mm/px · z∈[-56,+96]mm · 6 of 77 slices shown (2 of 2)]
[im 1/77]
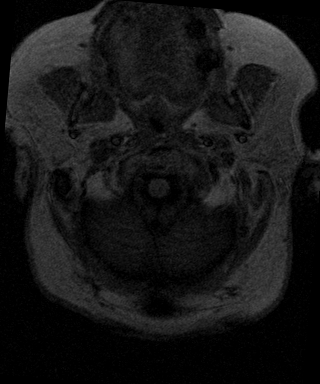
[im 16/77]
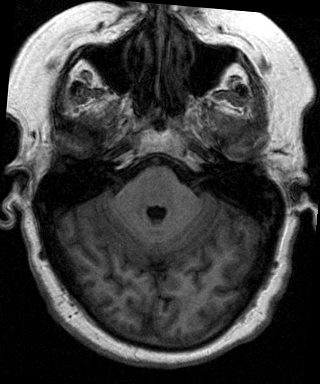
[im 31/77]
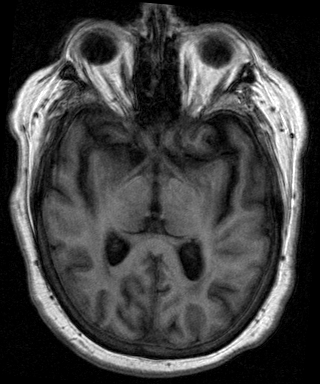
[im 46/77]
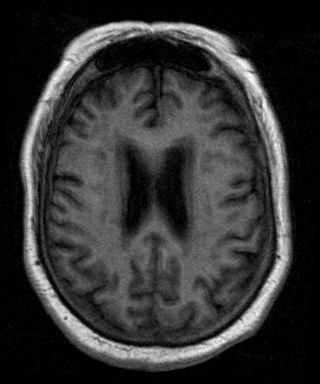
[im 61/77]
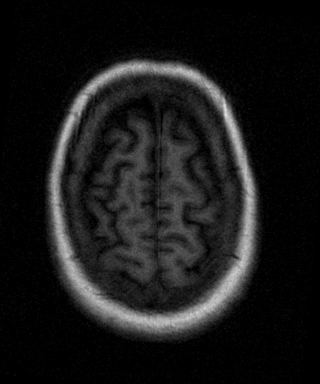
[im 77/77]
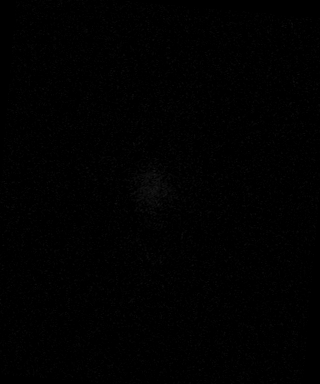

[Series 16: T2 · axial · 5.0mm · 0.47mm/px · z∈[-52,+86]mm · 2 of 24 slices shown (2 of 2)]
[im 1/24]
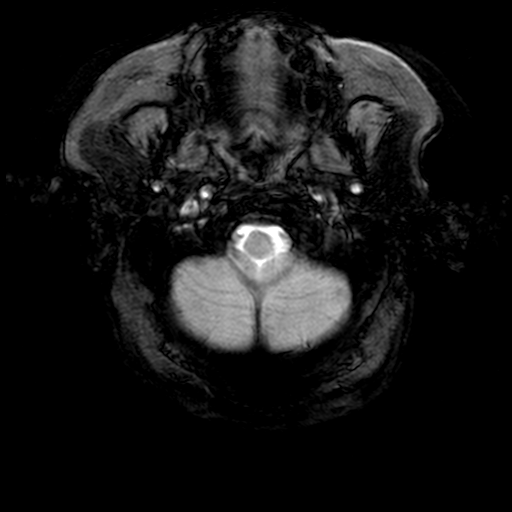
[im 24/24]
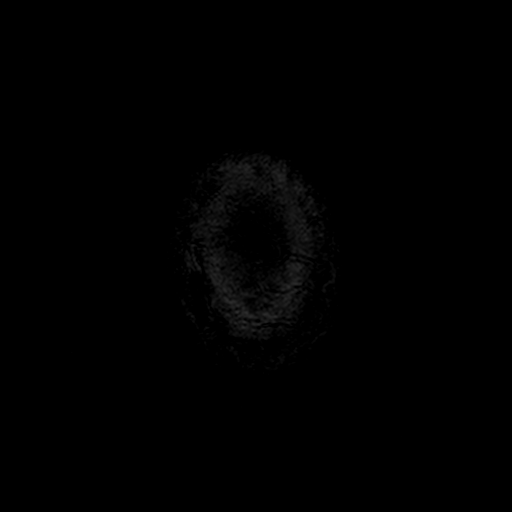

[Series 18: FLAIR · axial · 5.0mm · 0.90mm/px · z∈[-52,+86]mm · 2 of 24 slices shown (2 of 2)]
[im 1/24]
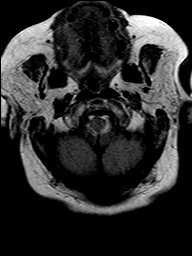
[im 24/24]
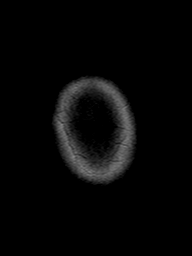

[Series 34: T1 post-contrast · axial · 2.0mm · 0.60mm/px · z∈[-96,+62]mm · 6 of 80 slices shown (1 of 2)]
[im 1/80]
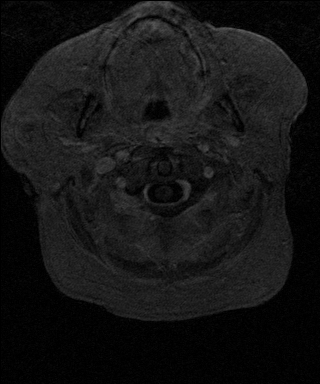
[im 16/80]
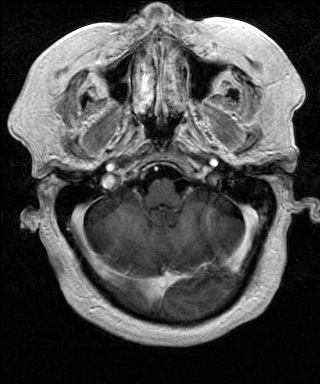
[im 32/80]
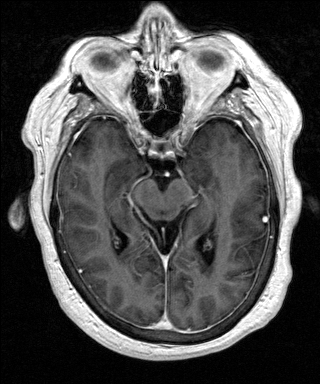
[im 48/80]
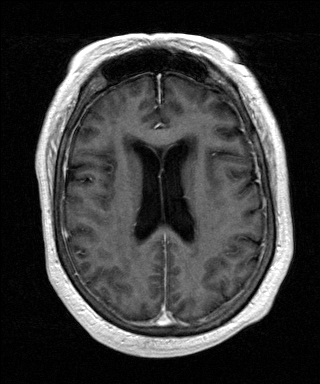
[im 64/80]
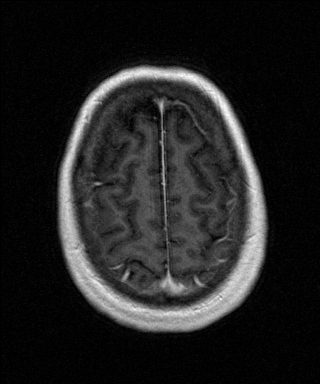
[im 80/80]
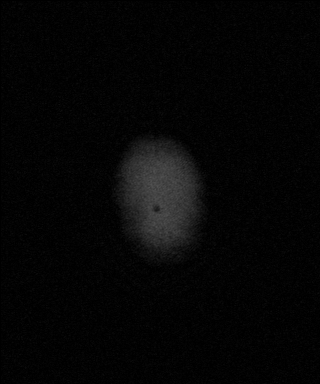

[Series 36: T1 post-contrast · coronal · 5.0mm · 0.43mm/px · 2 of 24 slices shown (2 of 2)]
[im 1/24]
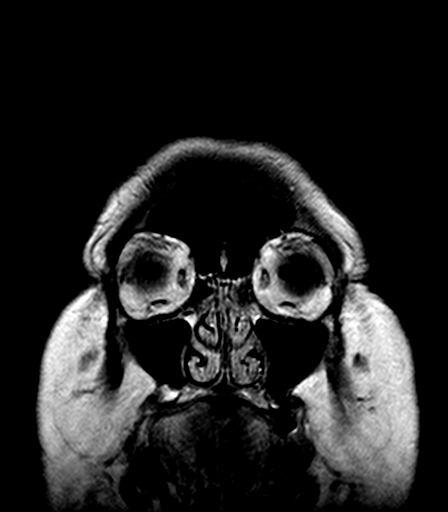
[im 24/24]
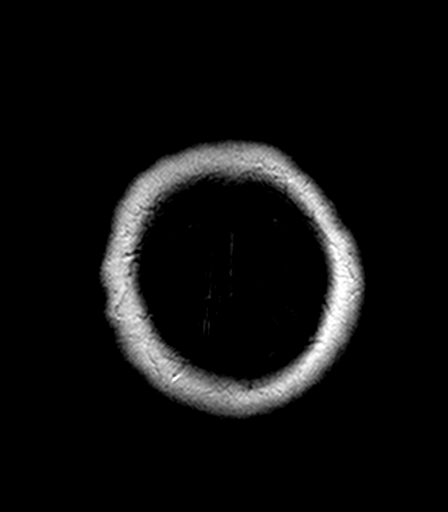

[27 of 48 positions shown; findings below may reference images not displayed]

FINDINGS: No restricted diffusion.  No midline shift, ventriculomegaly, mass effect, intracranial hemorrhage or extraaxial collection.  
 Normal cervicomedullary junction and pituitary.  Normal orbits and mastoids.  Mild posterior ethmoid and sphenoid paranasal sinus mucosal thickening.  There is confluent periventricular and central white matter T2-FLAIR signal abnormality without enhancement, most commonly related to chronic small vessel ischemia.  There is mild to moderate involvement of the pons.  The cerebellum and basal ganglia appear relatively spared.  There is some mineralization of the lentiform nuclei which may be age related.  No abnormal mass or enhancement.  Suggestion of slight diffuse dural enhancement is not correlated on the coronal post contrast image.  Bone marrow signal is within normal limits.
IMPRESSION: 1.  No acute intracranial abnormality. 
 2.  Advanced chronic small vessel disease for age.  
 MR ANGIOGRAPHY OF NECK:
FINDINGS: Time-of-flight imaging demonstrates antegrade flow in the carotid and vertebral arteries.  No flow gaps at the carotid bifurcations to suggest high-grade stenosis.  Post contrast images demonstrate a 3-vessel arch configuration with mild tortuosity of the great vessels.  The bilateral common carotid artery origins are within normal limits.  The vertebral artery origins are suboptimally visualized.  There is the suggestion of moderate bilateral vertebral artery stenosis.  
 The V2 and V3 vertebral artery segments are normal and the arteries are balanced.  There is smooth tapered narrowing of both V4 segments which may reflect atherosclerotic disease, particularly on the left, or may be related to decreased flow beyond the origins of the PICA vessels.  Normal left PICA is partially seen. 
 The right common carotid artery is normal aside from tortuosity.  The right carotid bifurcation and cervical internal carotid artery are normal. 
 The left common carotid artery is normal aside from tortuosity.  The left carotid bifurcation and cervical internal carotid artery are normal.
IMPRESSION: 1.  No carotid stenosis. 
 2.  Suboptimal visualization of vertebral artery origins with moderate bilateral origin stenosis suspected.  
 3.  Distal narrowing of the vertebral arteries could be related to atherosclerotic disease vs decreased vessel caliber beyond the PICA origins.  
 MR ANGIOGRAPHY OF HEAD:
FINDINGS: Antegrade flow signal in the posterior circulation.  Normal left PICA.  Normal vertebrobasilar junction.  Normal basilar artery, superior cerebellar arteries and left posterior cerebral artery.  There is a fetal origin of the right posterior cerebral artery which is patent.  
 Antegrade flow signal in the carotid siphons without stenosis.  Prominent right posterior communicating artery consistent with fetal origin of the right PICA.  Normal carotid termini.  Normal anterior cerebral and anterior communicating arteries.  The visualized MCA vessels are within normal limits.
IMPRESSION: Normal.  Fetal origin of the right posterior cerebral artery.

## 2009-09-23 IMAGING — CR DG CHEST 1V PORT
1 series · 1 of 1 positions shown · non-contrast
Comparison: Film earlier in the day.

CLINICAL DATA: History of strokes.
 PORTABLE CHEST- 1 VIEW:

[view not recorded]
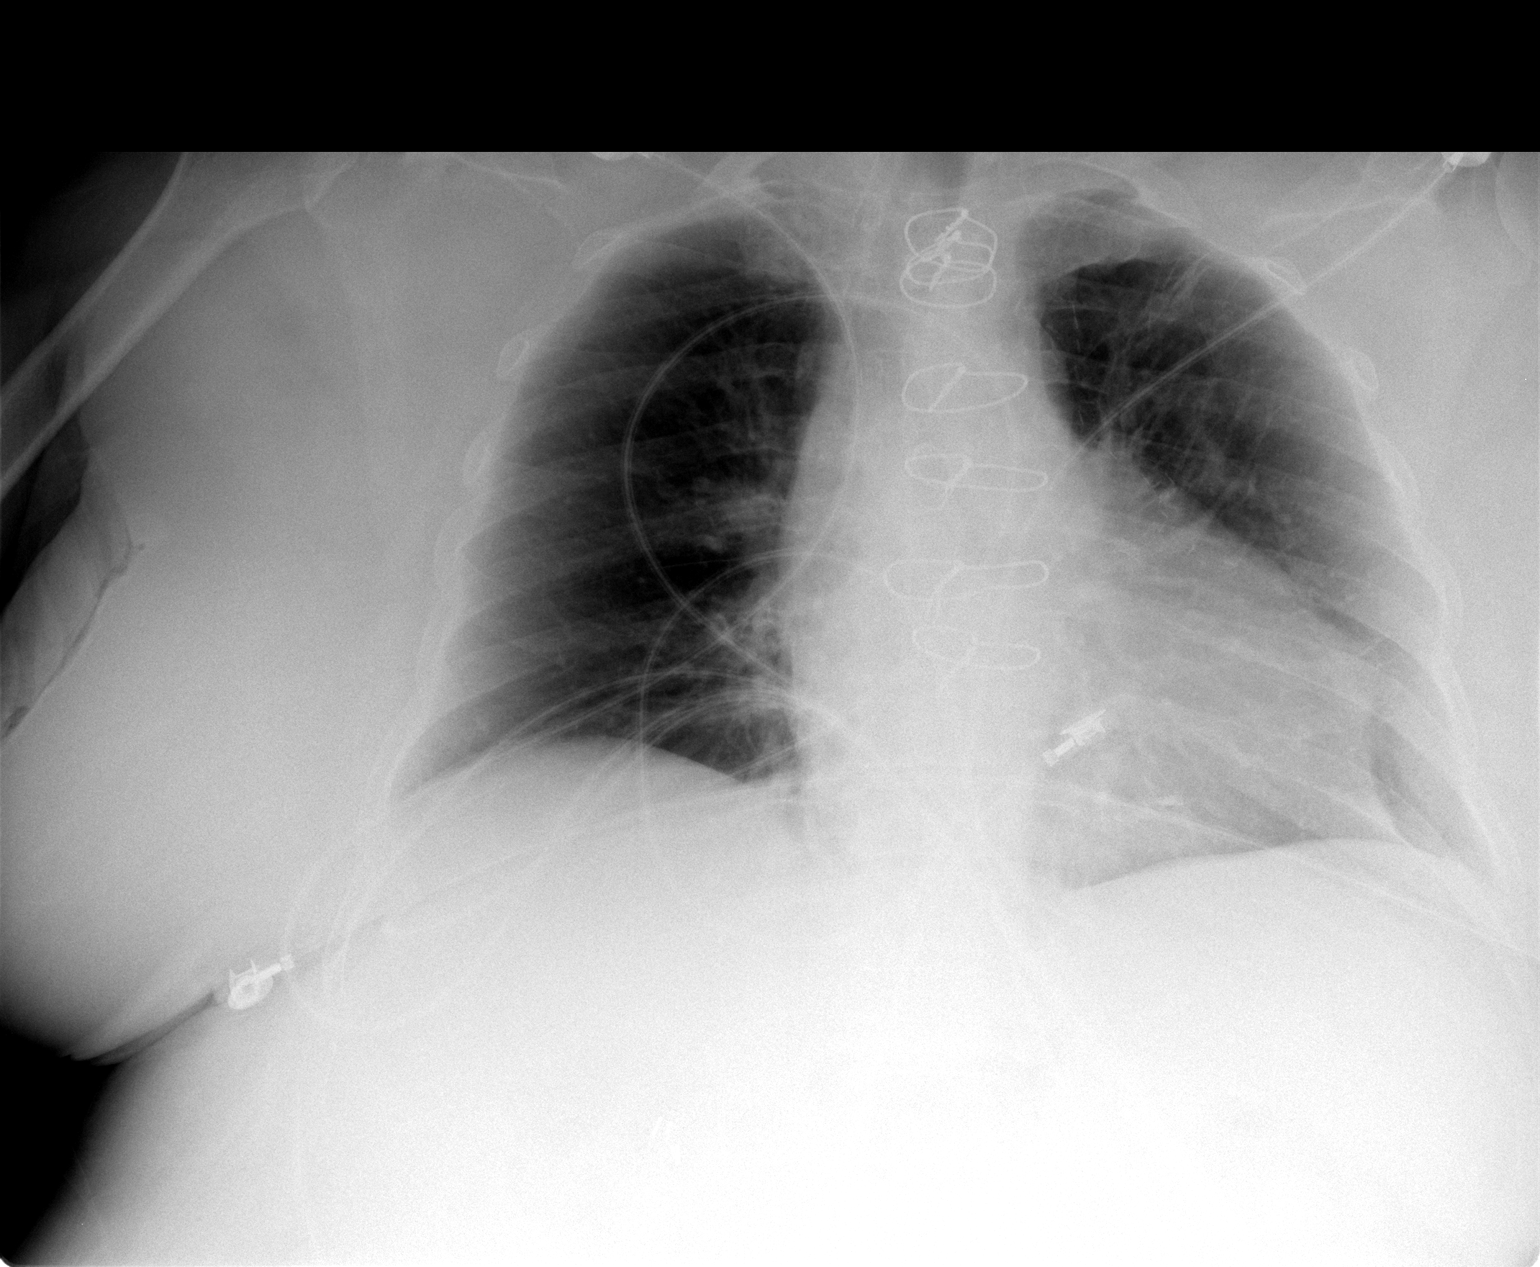

[1 of 1 positions shown; findings below may reference images not displayed]

Mild cardiac enlargement.  Coarse interstitial densities appear chronic without edema, infiltrates, or effusions.
IMPRESSION: Mild cardiac enlargement without acute pulmonary process.

## 2009-09-25 IMAGING — MR MR MRA HEAD W/O CM
2 series · 48 of 48 positions shown · IV contrast (agent unspecified)
Comparison: MRI, MRA of the brain and MRA of the neck 09/16/07.

CLINICAL DATA: 71-year-old female with headache, vertigo, and nausea.  Evaluate venous thrombosis.
MR VENOGRAPHY OF THE INTRACRANIAL CIRCULATION WITHOUT CONTRAST:
TECHNIQUE: 2D time-of-flight technique was performed of the vessels of the brain from base to vertex without intravenous contrast.  Multiple rotational 3 dimensional image reconstructions were generated from the axial source images.

[Series 7: fl2d_pc-tra_msum · axial · 60.0mm · 0.69mm/px · 1 of 1 slices shown]
[im 1/1]
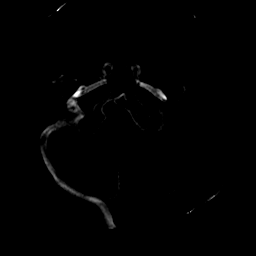

[Series 8: tof_fl2d_sag_sinus_obl · sagittal · 3.0mm · 0.98mm/px · 47 of 80 slices shown]
[im 1/80]
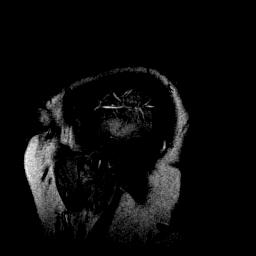
[im 2/80]
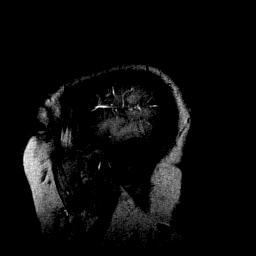
[im 4/80]
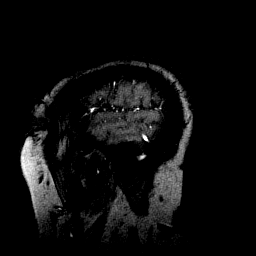
[im 6/80]
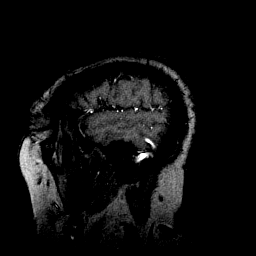
[im 7/80]
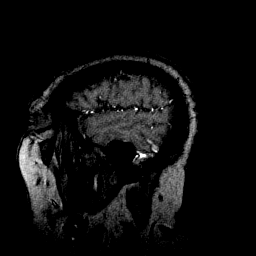
[im 9/80]
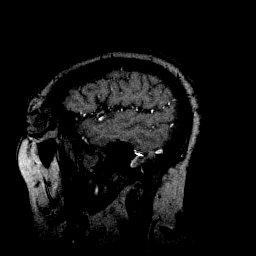
[im 11/80]
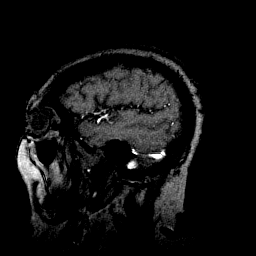
[im 13/80]
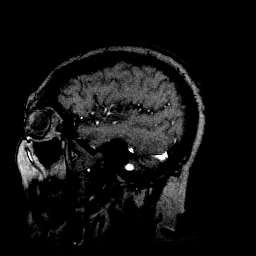
[im 14/80]
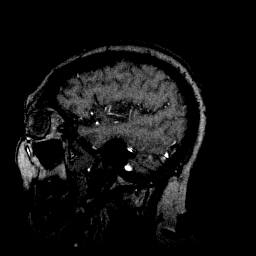
[im 16/80]
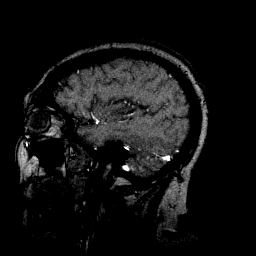
[im 18/80]
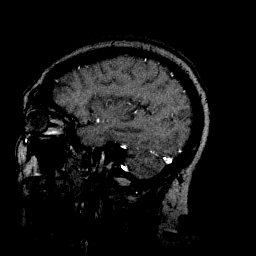
[im 19/80]
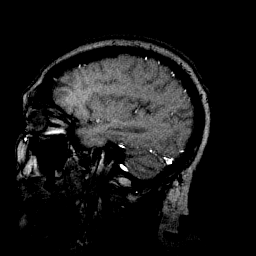
[im 21/80]
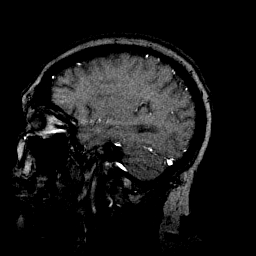
[im 23/80]
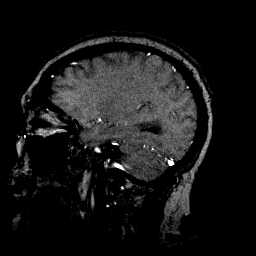
[im 25/80]
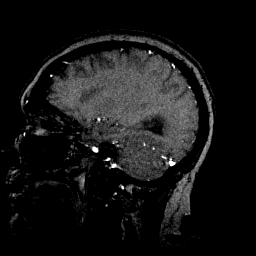
[im 26/80]
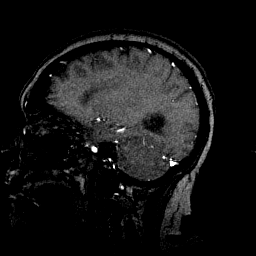
[im 28/80]
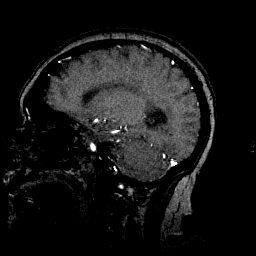
[im 30/80]
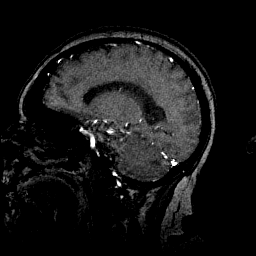
[im 31/80]
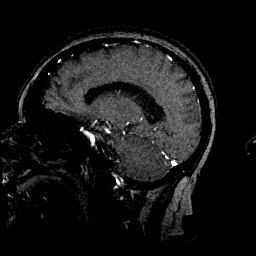
[im 33/80]
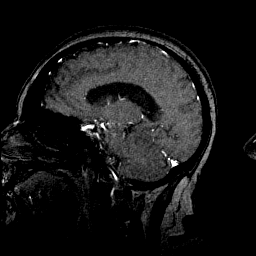
[im 35/80]
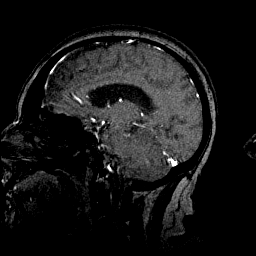
[im 37/80]
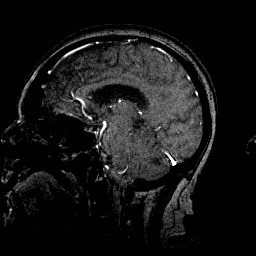
[im 38/80]
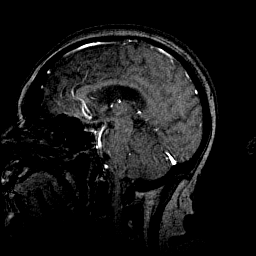
[im 40/80]
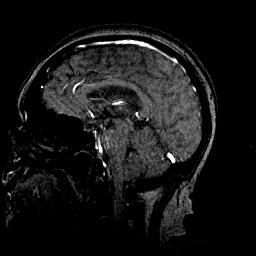
[im 42/80]
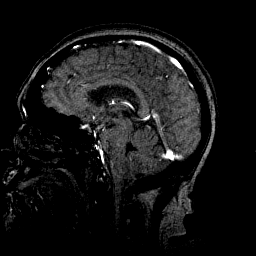
[im 43/80]
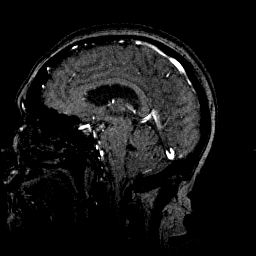
[im 45/80]
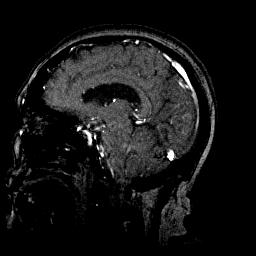
[im 47/80]
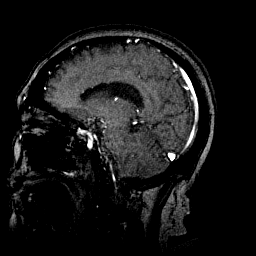
[im 49/80]
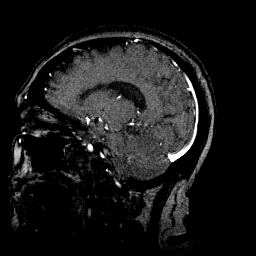
[im 50/80]
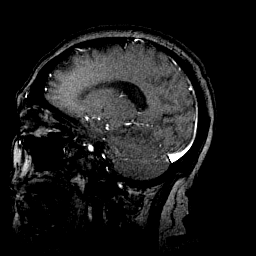
[im 52/80]
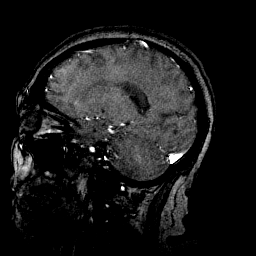
[im 54/80]
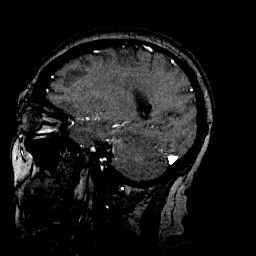
[im 55/80]
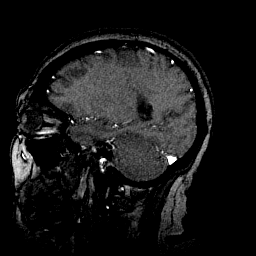
[im 57/80]
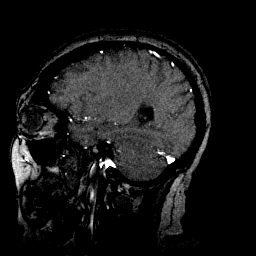
[im 59/80]
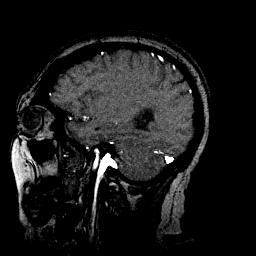
[im 61/80]
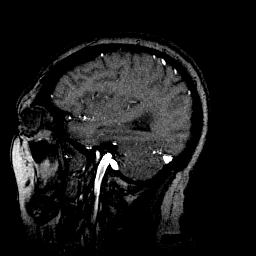
[im 62/80]
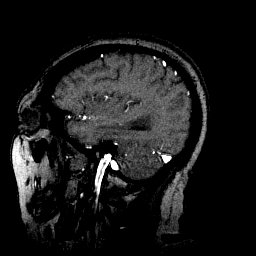
[im 64/80]
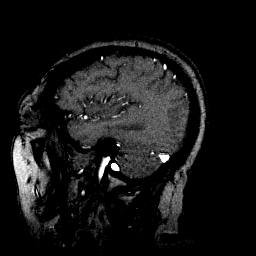
[im 66/80]
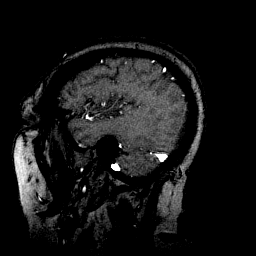
[im 67/80]
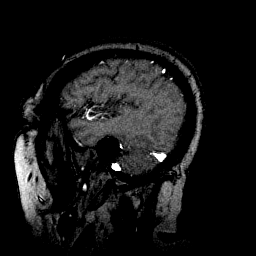
[im 69/80]
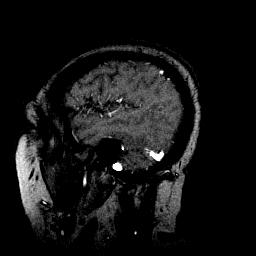
[im 71/80]
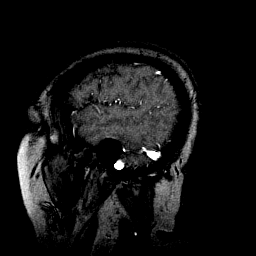
[im 73/80]
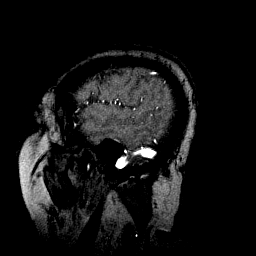
[im 74/80]
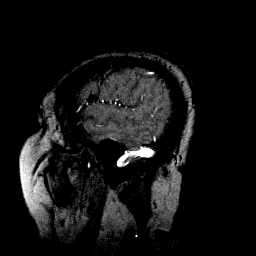
[im 76/80]
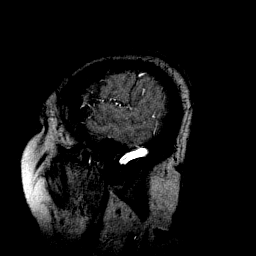
[im 78/80]
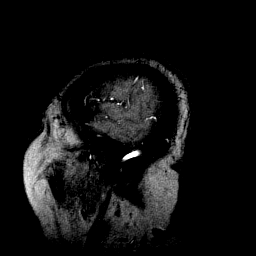
[im 80/80]
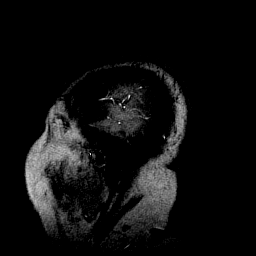

[48 of 48 positions shown; findings below may reference images not displayed]

FINDINGS: T2 and post contrast T1-weighted images on the prior MRI study suggested patency of all intracranial venous structures at that time.
Normal flow signal in the superior sagittal sinus, torcula, straight sinus, internal cerebral veins, septal veins, and the right basal vein of Strittmatter.  Flow is demonstrated at the torcula and in both transverse sinuses, right greater than left.  Dominant drainage to the right transverse sinus from the superior sagittal sinus was demonstrated on the prior MRI, and dominance of the right jugular bulb was also noted.  These likely explain the attenuated flow signal in the left sigmoid sinus and left jugular bulb.  Flow within an emissary vein from the left sigmoid sinus is noted.  linear filling defect in the right transverse, sigmoid sinuses and internal jugular vein most likely represents flow phenomenon (artifact).  Superficial draining veins on the surface of the brain also appear patent.
IMPRESSION: 1.  No strong evidence of venous thrombosis.
2.  Dominant right transverse, sigmoid sinuses and right internal jugular vein are noted with flow related artifact suspected.

## 2009-10-30 ENCOUNTER — Ambulatory Visit (HOSPITAL_COMMUNITY): Admission: RE | Admit: 2009-10-30 | Discharge: 2009-10-30 | Payer: Self-pay | Admitting: Family Medicine

## 2009-12-07 IMAGING — CR DG LUMBAR SPINE COMPLETE 4+V
5 series · 5 of 5 positions shown · non-contrast
Comparison: none

CLINICAL DATA: Back pain.  
LUMBAR SPINE ? 5 VIEW:

[view not recorded (1 of 5)]
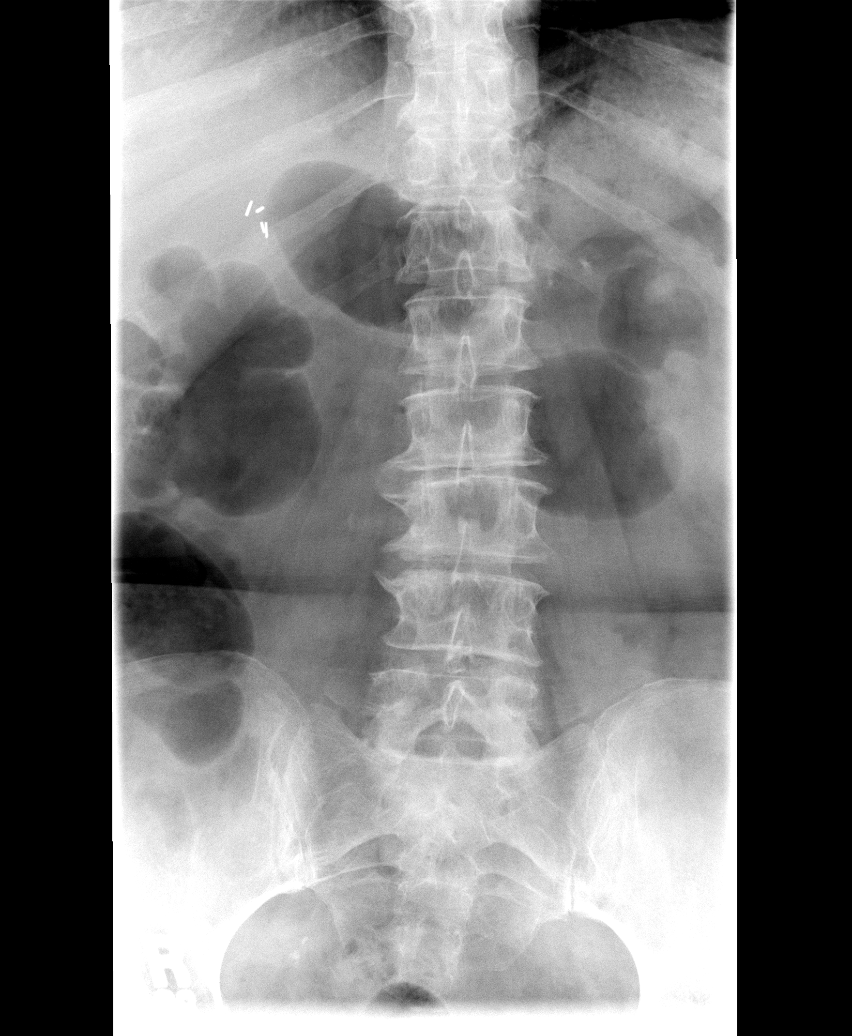

[view not recorded (2 of 5)]
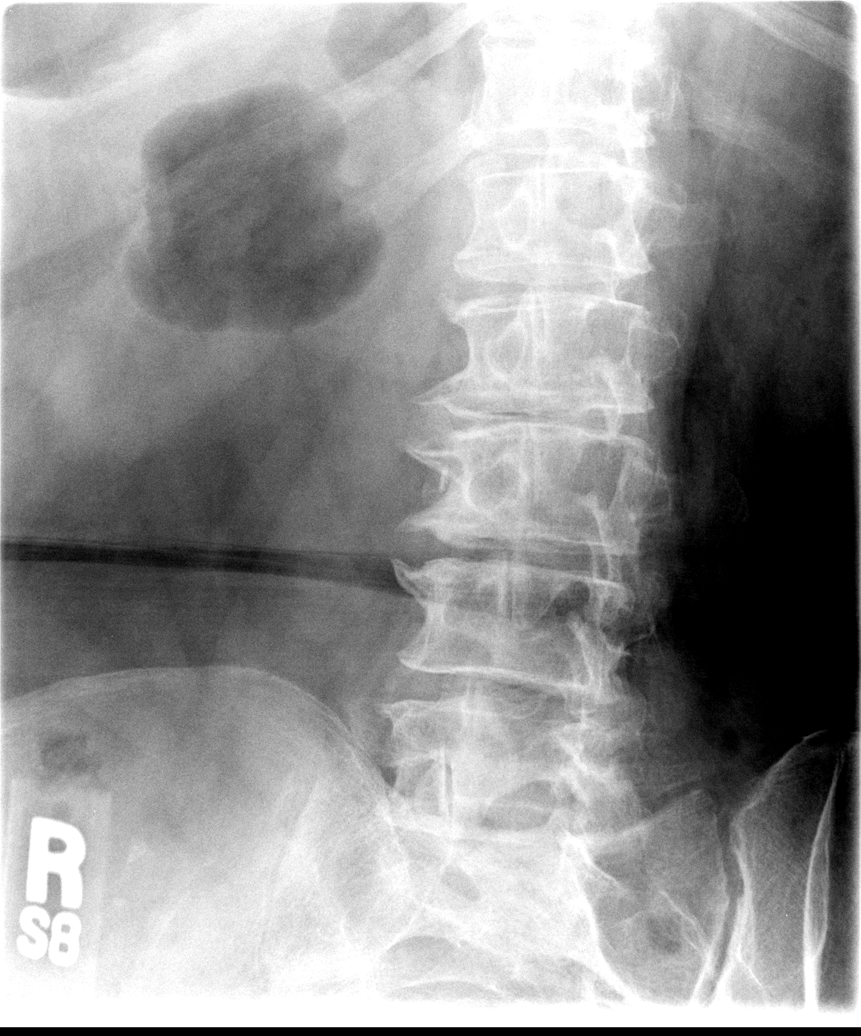

[view not recorded (3 of 5)]
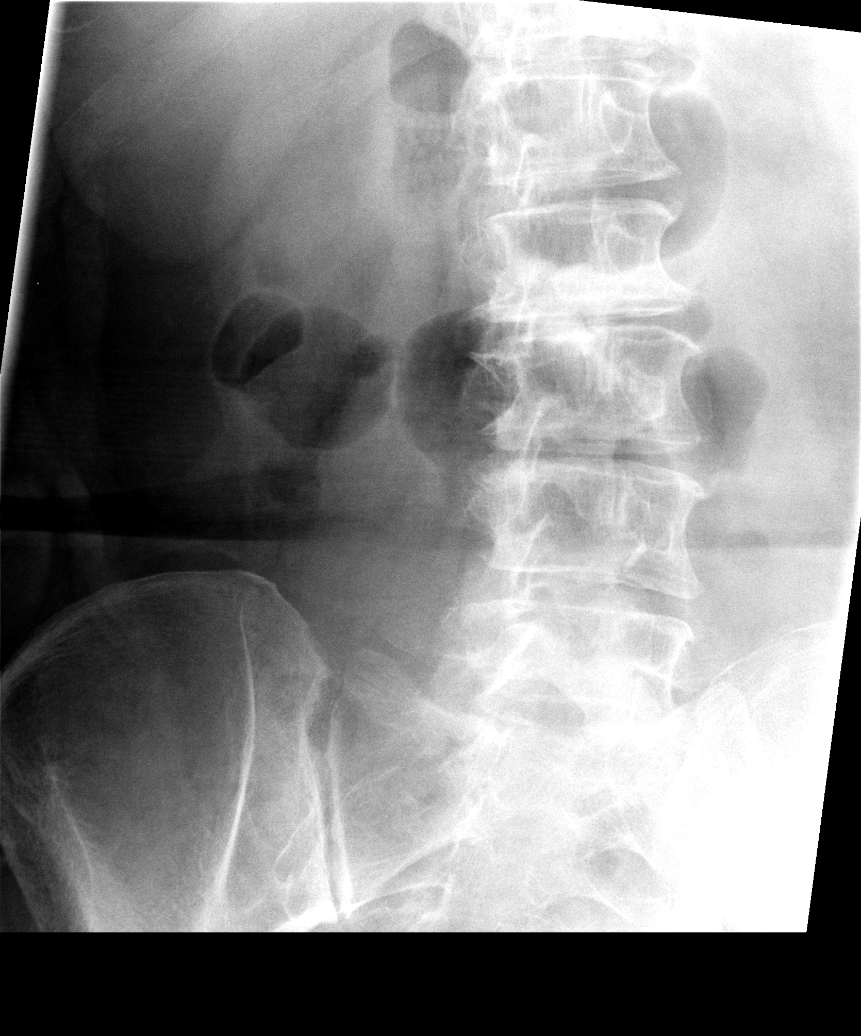

[view not recorded (4 of 5)]
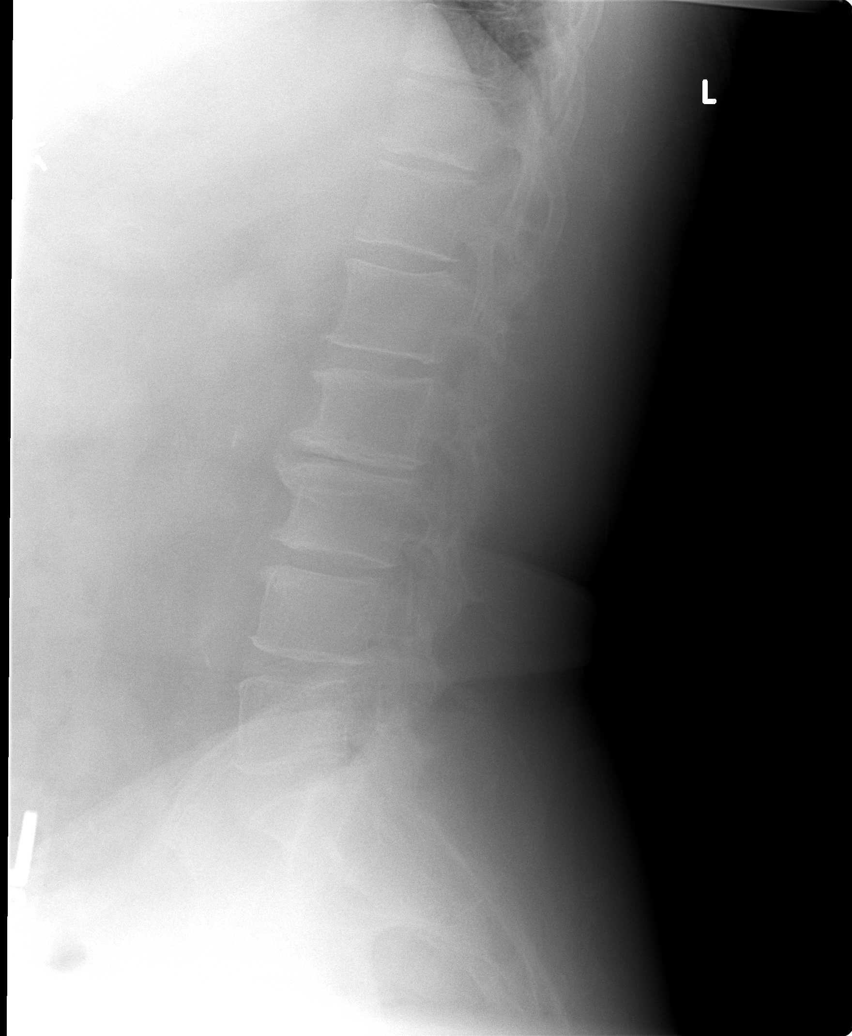

[view not recorded (5 of 5)]
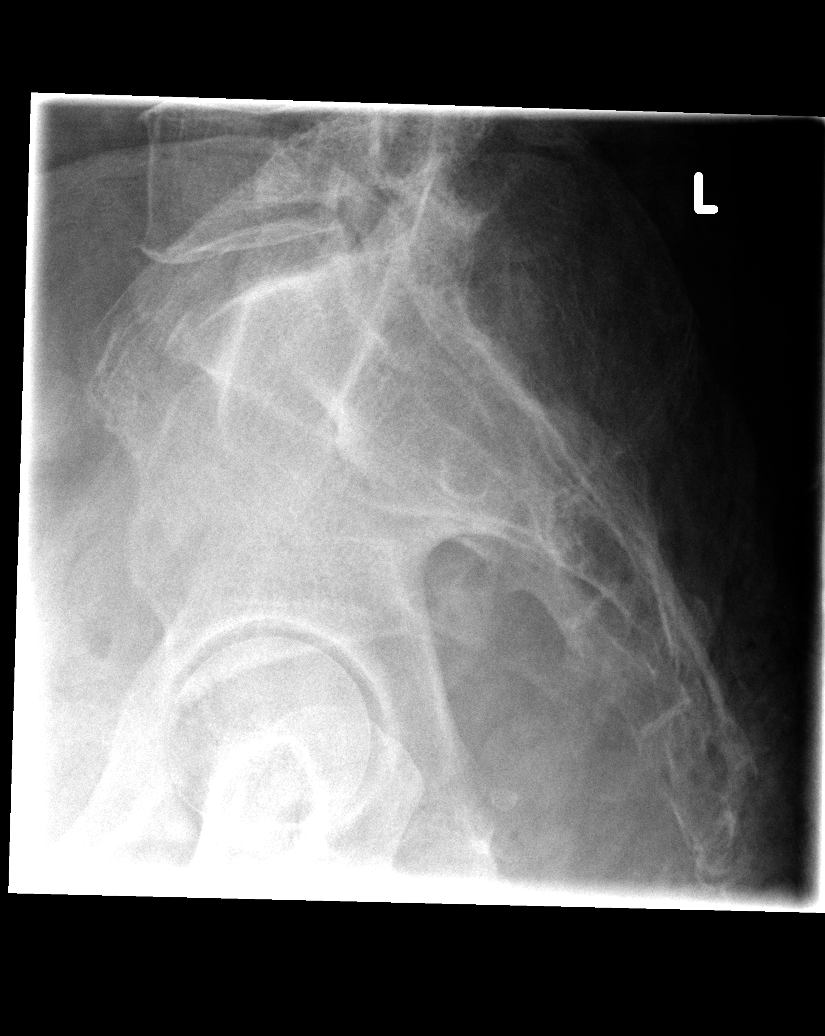

[5 of 5 positions shown; findings below may reference images not displayed]

FINDINGS: Disk space narrowing and osteophytic formation mainly anteriorly at L2-3.  Minimal retrolisthesis of L4 on L5 by 3-4 mm.  No pars defects.
IMPRESSION: DDD L2-3.  Minimal retrolisthesis of L4 on L5.  Moderate diffuse osteopenia.

## 2009-12-07 IMAGING — CR DG CHEST 1V
1 series · 1 of 1 positions shown · non-contrast
Comparison: 09/19/2007

CLINICAL DATA: Wheezing/back pain.
 CHEST - 1 VIEW:

[view not recorded]
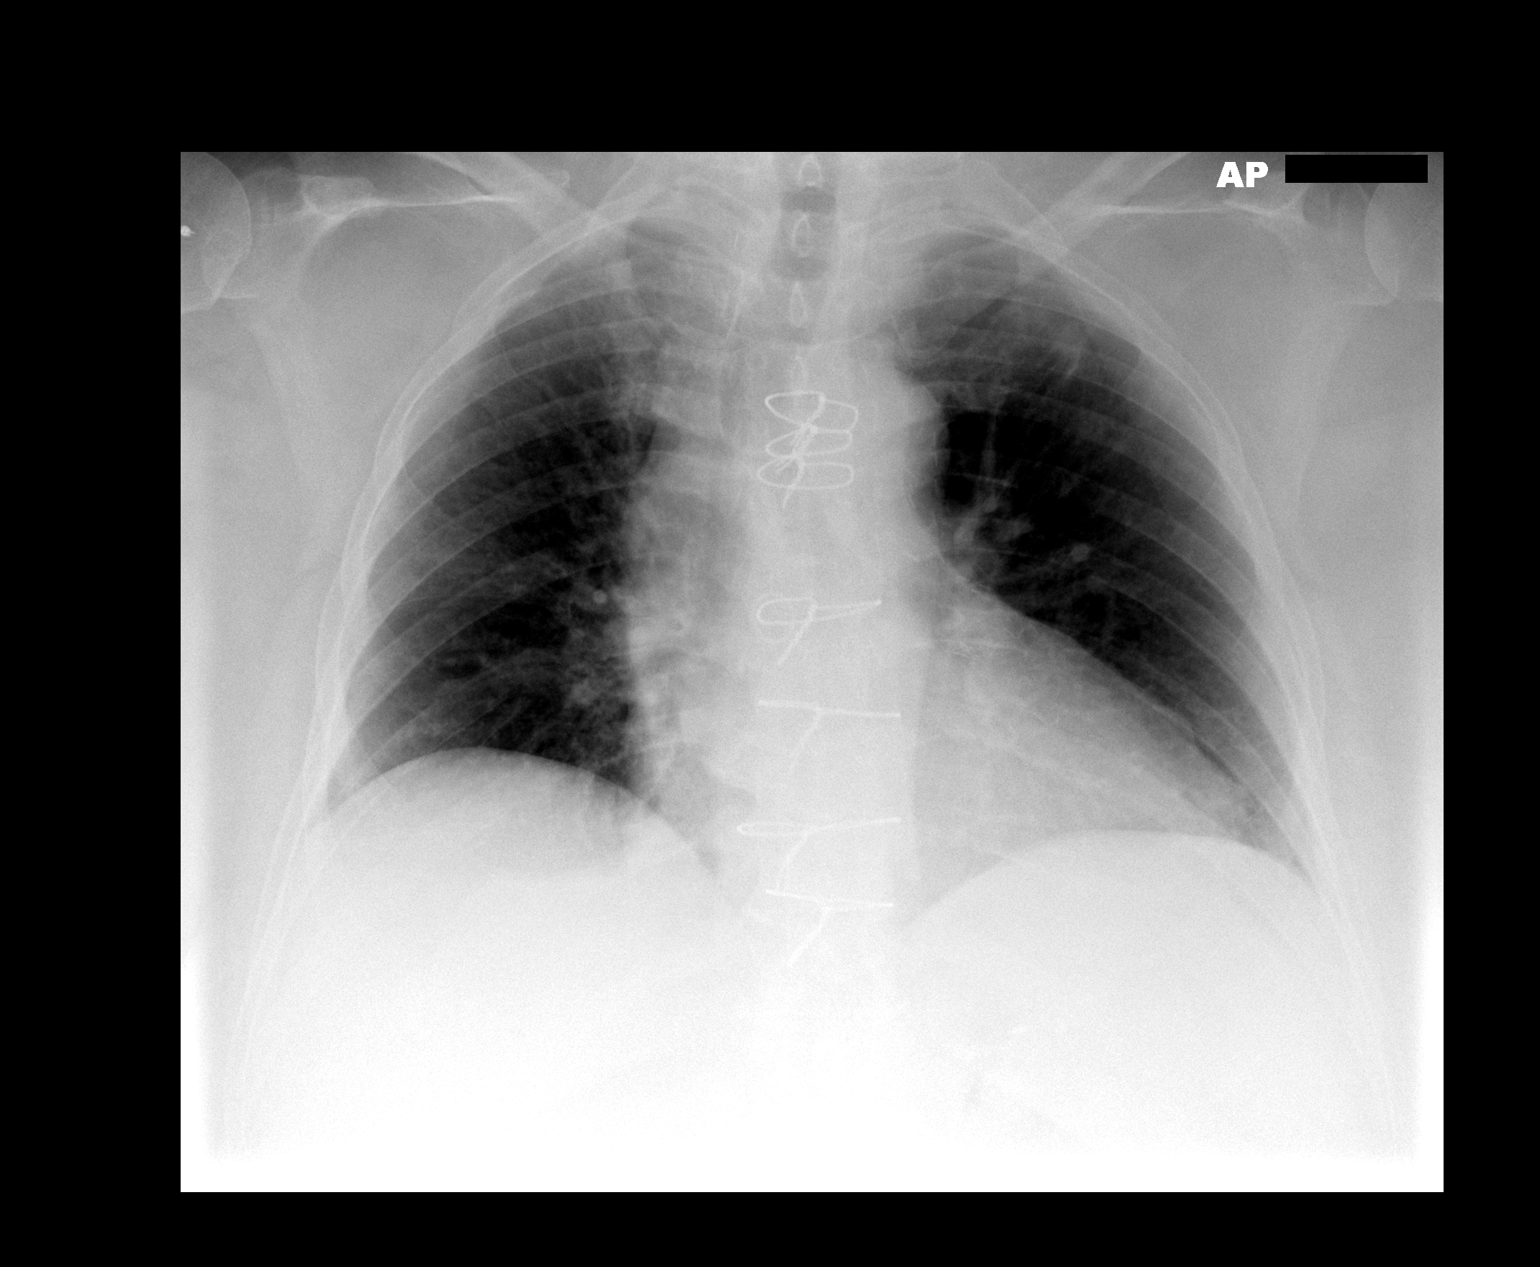

[1 of 1 positions shown; findings below may reference images not displayed]

FINDINGS: Heart borderline enlarged considering AP projection. Prior median sternotomy.  No congestive heart failure or active disease.
IMPRESSION: Borderline cardiomegaly ? no active disease.

## 2009-12-25 IMAGING — CR DG CHEST 2V
2 series · 2 of 2 positions shown · non-contrast
Comparison: 12/03/07.

This report is delayed due to PACS failure.
CLINICAL DATA: Cough and fever.
 CHEST - 2 VIEW:

[view not recorded (1 of 2)]
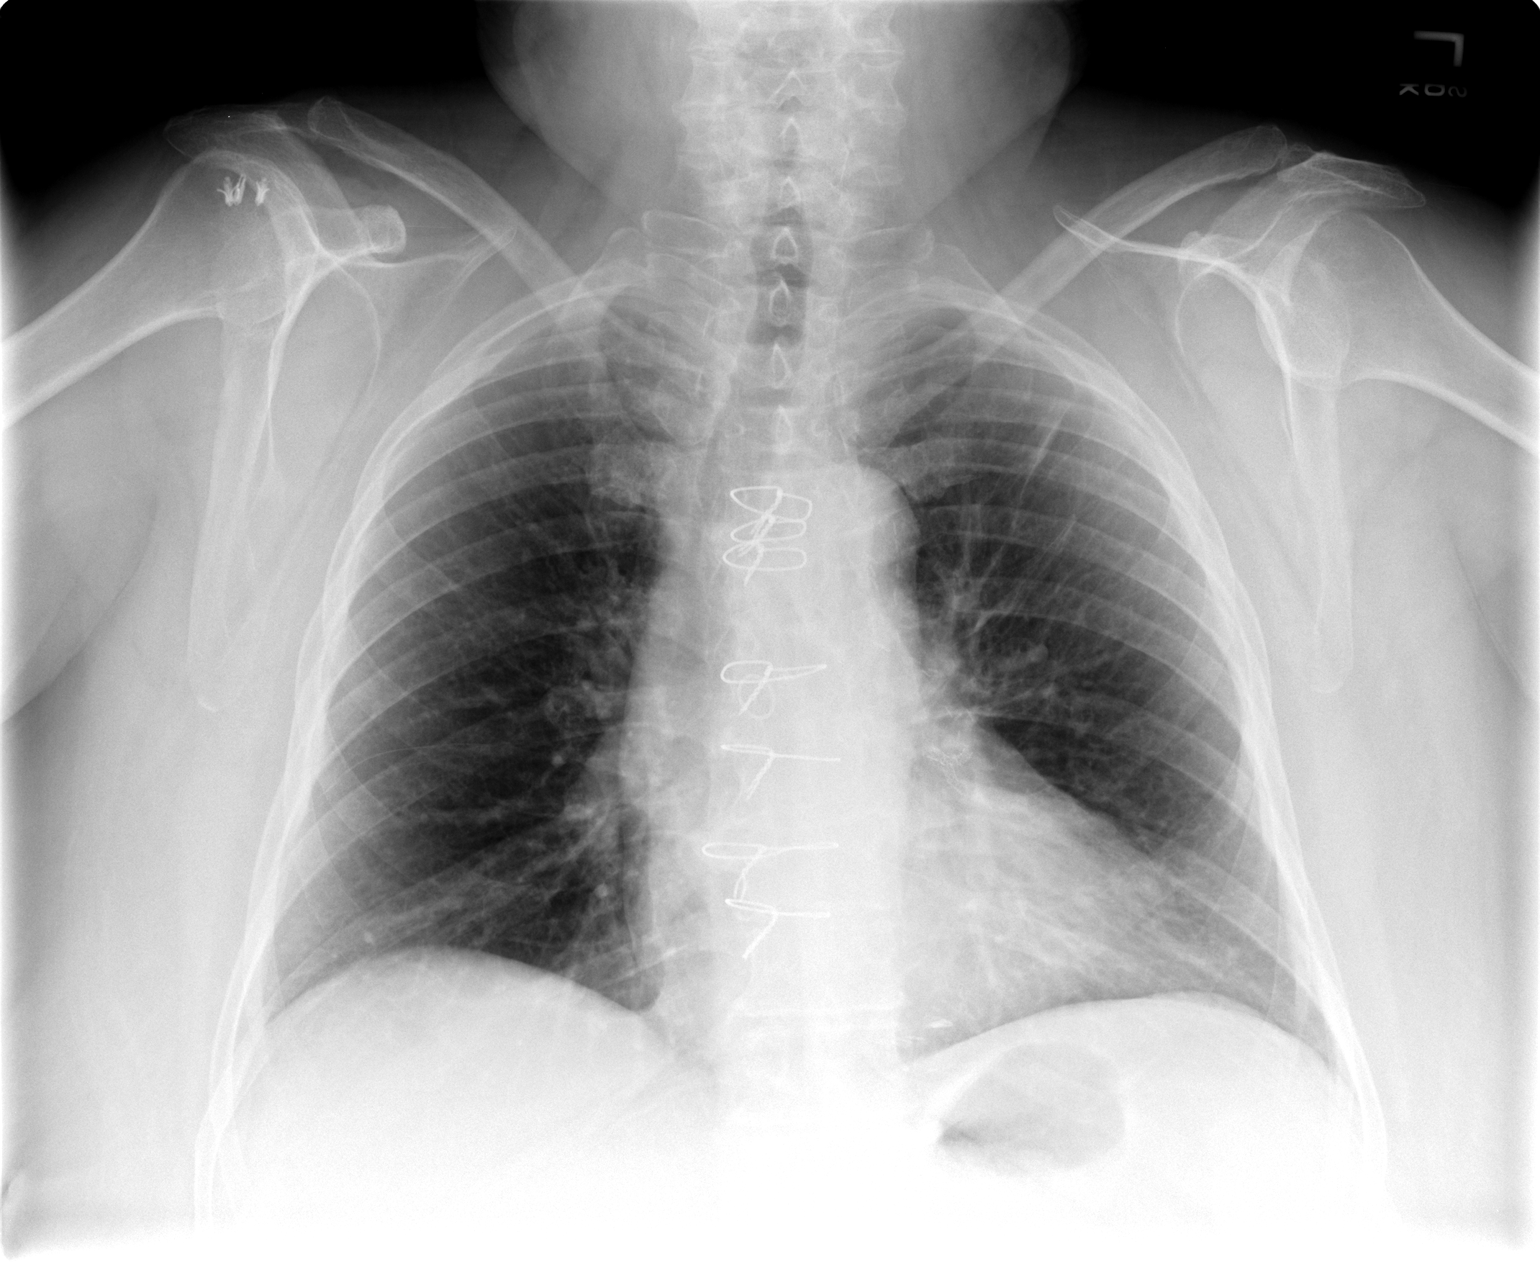

[view not recorded (2 of 2)]
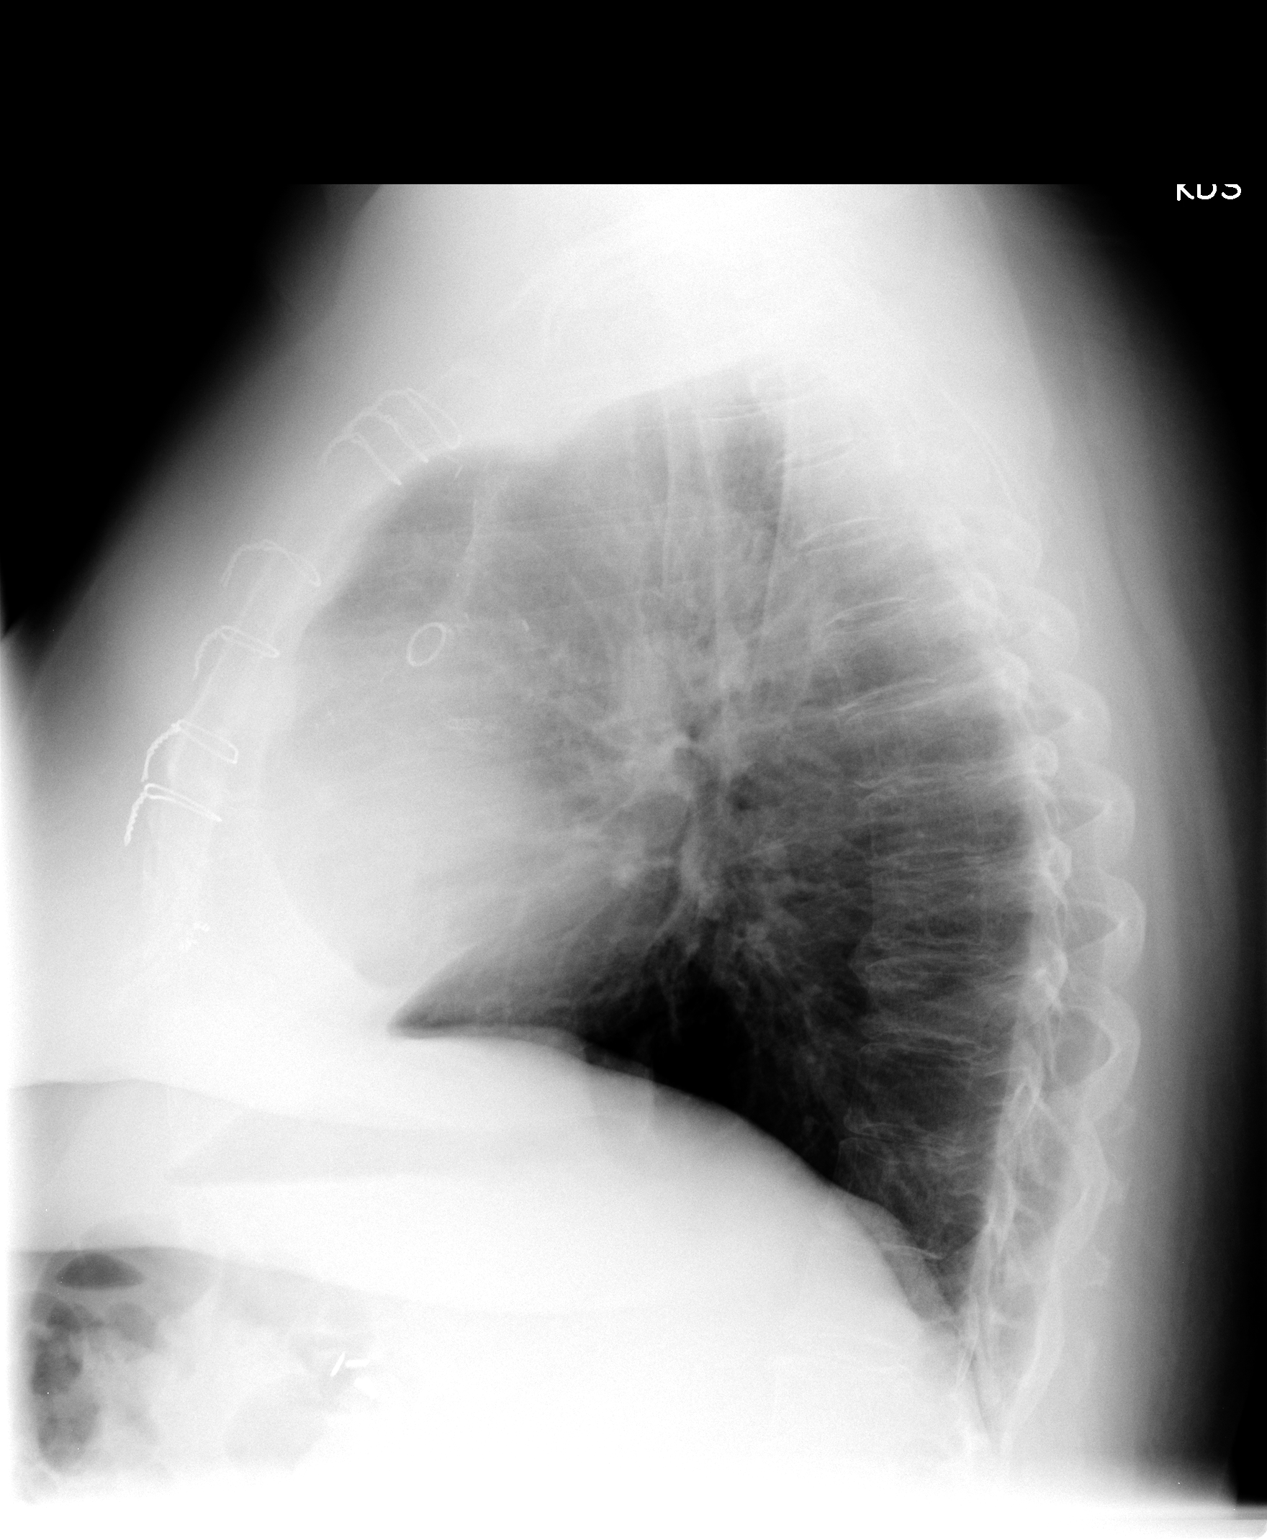

[2 of 2 positions shown; findings below may reference images not displayed]

FINDINGS: Postop CABG. Heart is upper normal in size. Negative for heart failure. There is no edema or effusion and the lungs are clear.
IMPRESSION: No active cardiopulmonary disease.

## 2010-01-11 IMAGING — CT CT CHEST W/ CM
1 series · 16 of 32 positions shown, 20 images · IV contrast (Omnipaque 300)
Comparison: none

HISTORY: Followup right middle lobe nodule

[Series 2: chestroutine 5.0 b40f · axial · 0.69mm/px · z∈[-300,-50]mm · 16 of 55 slices shown, 20 images]
[im 3/55  mediastinal]
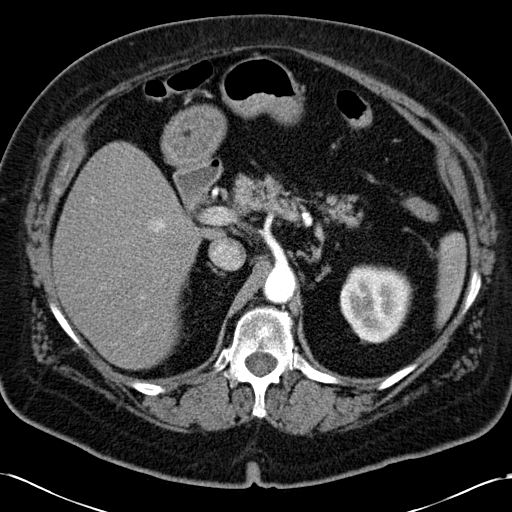
[im 3/55  lung]
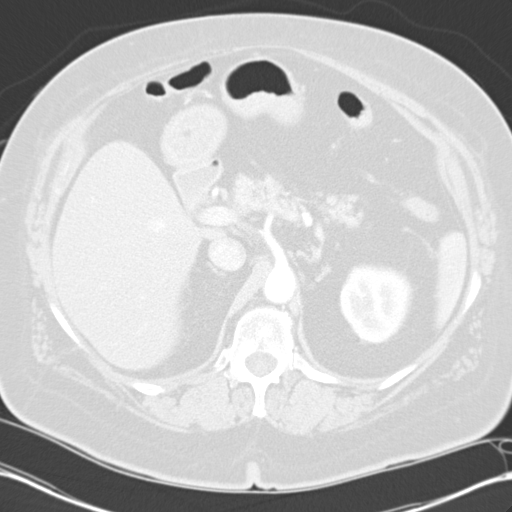
[im 7/55  lung]
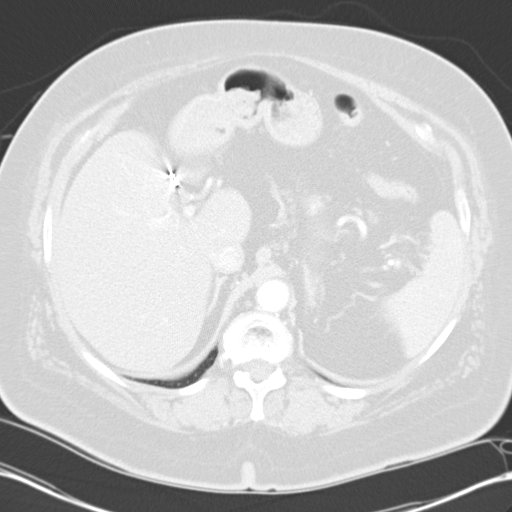
[im 11/55  lung]
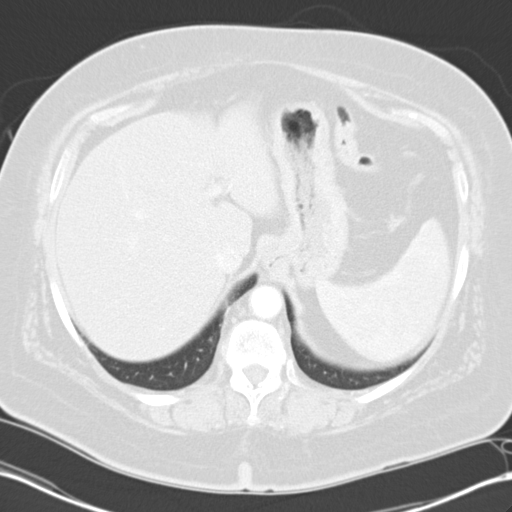
[im 15/55  lung]
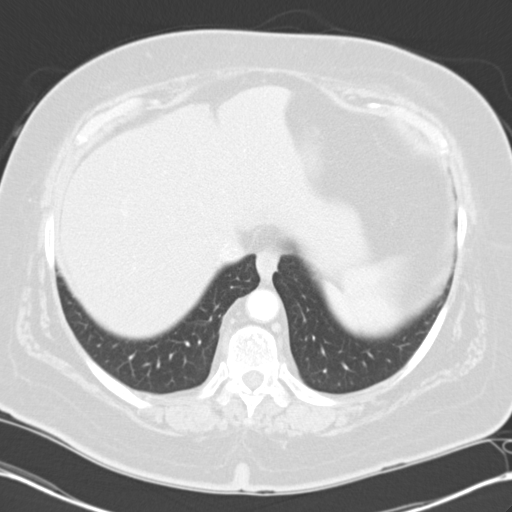
[im 19/55  mediastinal]
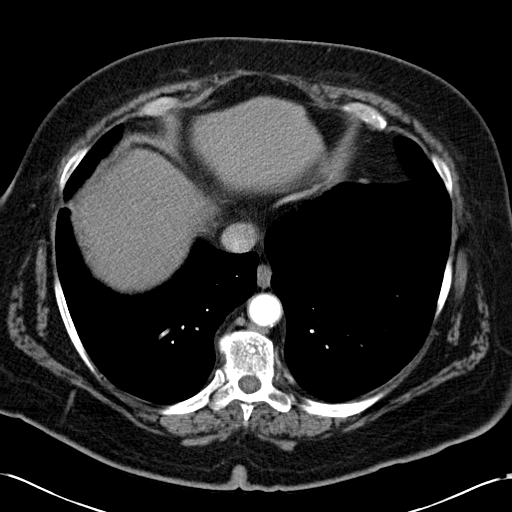
[im 19/55  lung]
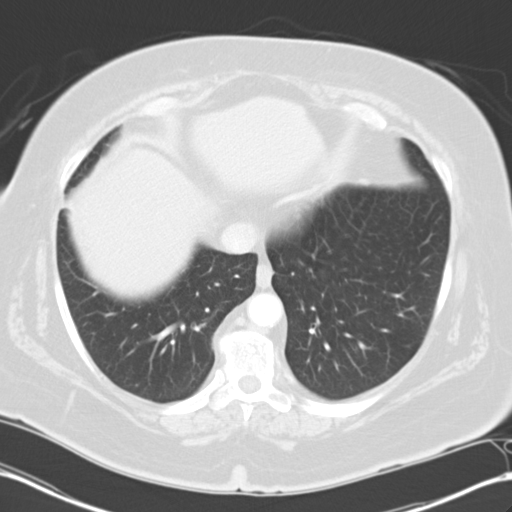
[im 22/55  lung]
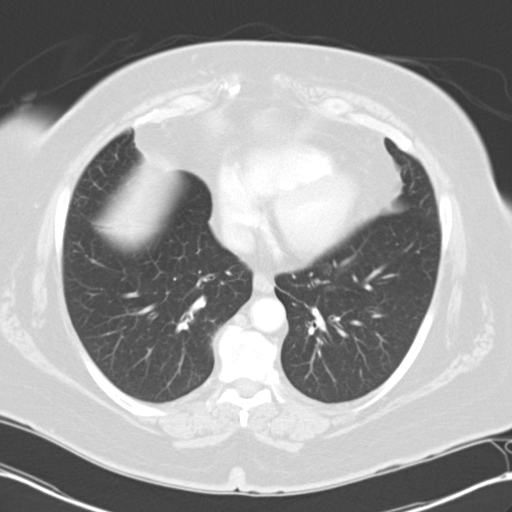
[im 25/55  lung]
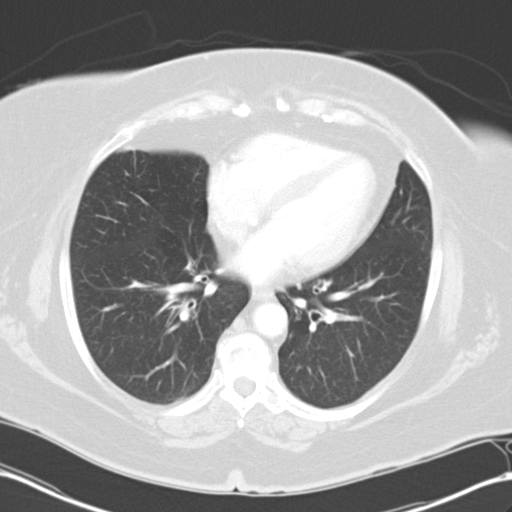
[im 27/55  lung]
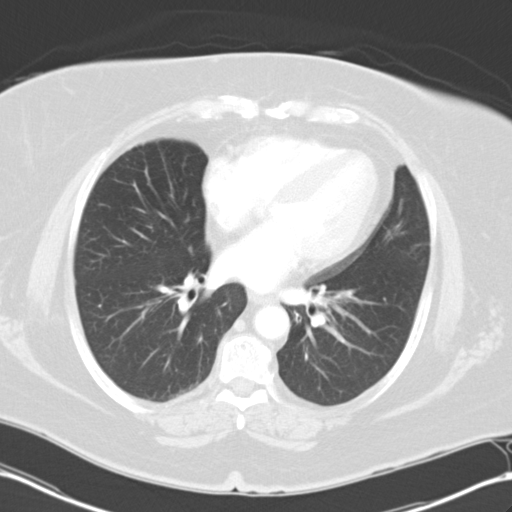
[im 29/55  mediastinal]
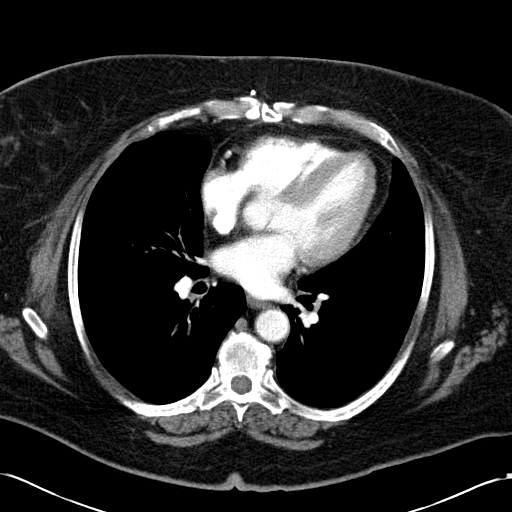
[im 29/55  lung]
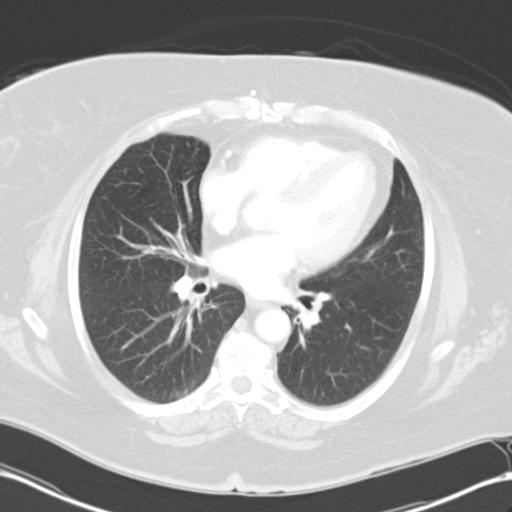
[im 31/55  lung]
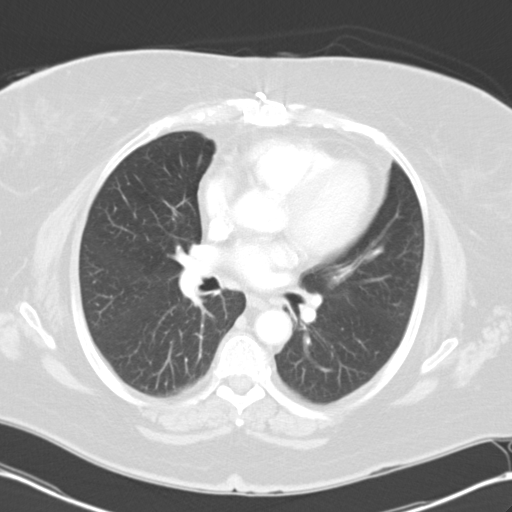
[im 35/55  lung]
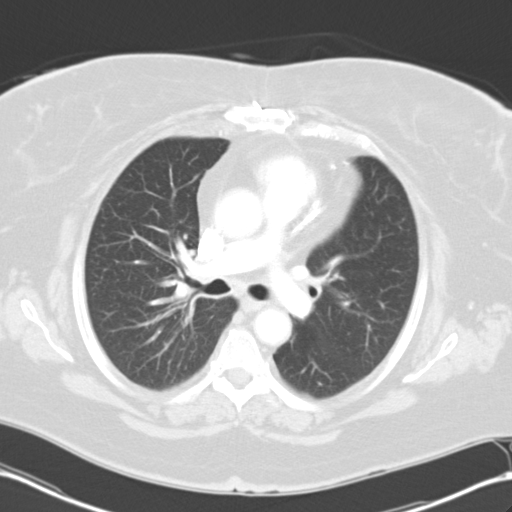
[im 39/55  lung]
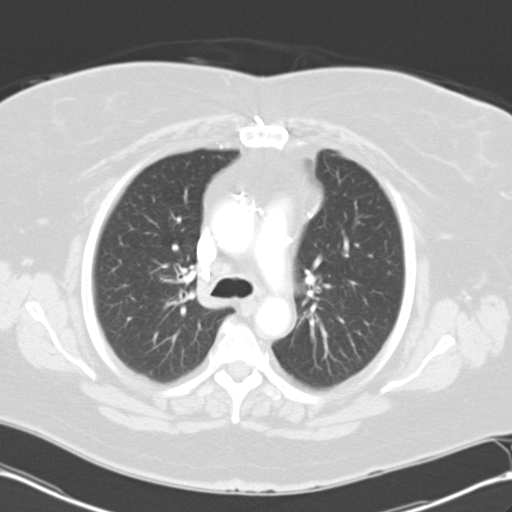
[im 43/55  mediastinal]
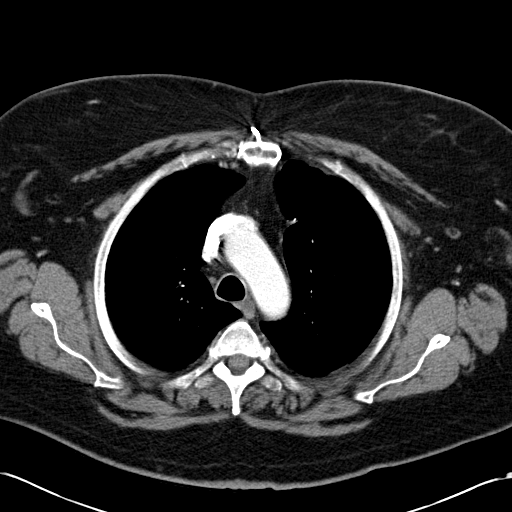
[im 43/55  lung]
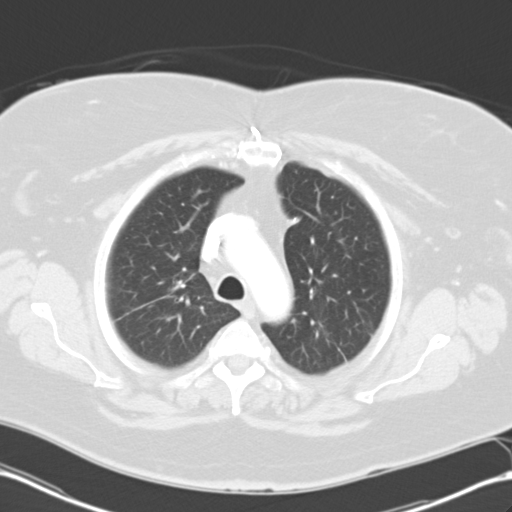
[im 45/55  lung]
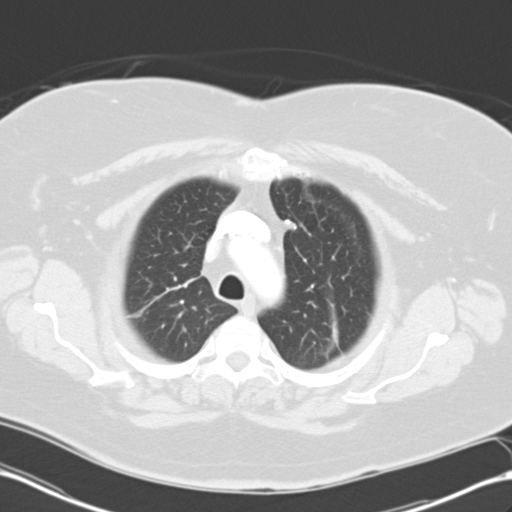
[im 49/55  lung]
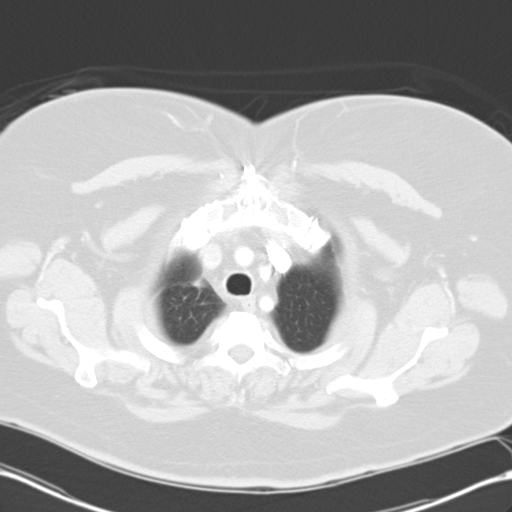
[im 53/55  lung]
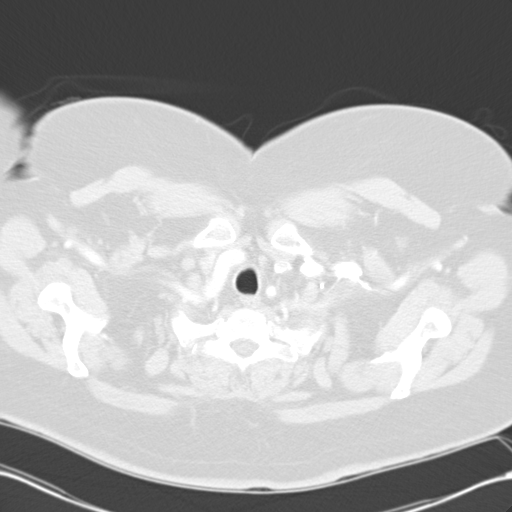

[16 of 32 positions shown; findings below may reference images not displayed]

CT CHEST WITH CONTRAST:

Multidetector helical CT chest performed following 80 cc 3mnipaque-9RR.
Sagittal and coronal images reconstructed from axial data set.
Correlation made with prior CT abdomen of 07/03/2007

Aorta normal caliber without aneurysm or dissection.
Status post CABG and cholecystectomy.
No thoracic adenopathy.
Tiny cyst upper pole left kidney image 55.
Remaining visualized portions of upper abdomen unremarkable.

Linear scarring bilateral upper lobes.
Scattered peribronchial thickening.
3 mm diameter right middle lobe nodule unchanged since previous exam, image 24.
No additional pulmonary mass, nodule, infiltrate, or pleural effusion.
Spur formation thoracic spine with scattered vacuum phenomenon.
IMPRESSION: 3 mm right middle lobe nodule, unchanged.
No new thoracic abnormalities.
Linear scarring bilateral upper lobes.

## 2010-01-18 ENCOUNTER — Ambulatory Visit (HOSPITAL_COMMUNITY): Admission: RE | Admit: 2010-01-18 | Discharge: 2010-01-18 | Payer: Self-pay | Admitting: Family Medicine

## 2010-01-19 ENCOUNTER — Encounter: Payer: Self-pay | Admitting: Emergency Medicine

## 2010-01-19 ENCOUNTER — Ambulatory Visit: Payer: Self-pay | Admitting: Cardiovascular Disease

## 2010-01-19 ENCOUNTER — Encounter: Payer: Self-pay | Admitting: Cardiology

## 2010-01-19 ENCOUNTER — Inpatient Hospital Stay (HOSPITAL_COMMUNITY): Admission: AD | Admit: 2010-01-19 | Discharge: 2010-01-21 | Payer: Self-pay | Admitting: Cardiology

## 2010-01-19 ENCOUNTER — Encounter (INDEPENDENT_AMBULATORY_CARE_PROVIDER_SITE_OTHER): Payer: Self-pay | Admitting: *Deleted

## 2010-01-19 LAB — CONVERTED CEMR LAB
Calcium: 9.6 mg/dL
Chloride: 102 meq/L
Creatinine, Ser: 0.65 mg/dL
Glomerular Filtration Rate, Af Am: >60 mL/min/{1.73_m2}

## 2010-01-20 ENCOUNTER — Encounter (INDEPENDENT_AMBULATORY_CARE_PROVIDER_SITE_OTHER): Payer: Self-pay | Admitting: *Deleted

## 2010-01-20 LAB — CONVERTED CEMR LAB
CO2: 28 meq/L
Calcium: 8.5 mg/dL
Creatinine, Ser: 0.75 mg/dL
GFR calc non Af Amer: >60 mL/min
Glomerular Filtration Rate, Af Am: >60 mL/min/{1.73_m2}
Sodium: 134 meq/L

## 2010-01-24 ENCOUNTER — Ambulatory Visit (HOSPITAL_COMMUNITY): Admission: RE | Admit: 2010-01-24 | Discharge: 2010-01-24 | Payer: Self-pay | Admitting: Family Medicine

## 2010-02-06 ENCOUNTER — Encounter: Payer: Self-pay | Admitting: *Deleted

## 2010-02-06 DIAGNOSIS — I10 Essential (primary) hypertension: Secondary | ICD-10-CM | POA: Insufficient documentation

## 2010-02-06 DIAGNOSIS — E785 Hyperlipidemia, unspecified: Secondary | ICD-10-CM

## 2010-02-06 DIAGNOSIS — E039 Hypothyroidism, unspecified: Secondary | ICD-10-CM

## 2010-02-06 DIAGNOSIS — G4730 Sleep apnea, unspecified: Secondary | ICD-10-CM

## 2010-02-14 ENCOUNTER — Ambulatory Visit: Payer: Self-pay | Admitting: Cardiology

## 2010-02-14 DIAGNOSIS — E118 Type 2 diabetes mellitus with unspecified complications: Secondary | ICD-10-CM

## 2010-02-14 DIAGNOSIS — E669 Obesity, unspecified: Secondary | ICD-10-CM

## 2010-02-14 DIAGNOSIS — I2510 Atherosclerotic heart disease of native coronary artery without angina pectoris: Secondary | ICD-10-CM

## 2010-03-02 ENCOUNTER — Encounter: Payer: Self-pay | Admitting: Cardiology

## 2010-03-02 ENCOUNTER — Encounter (INDEPENDENT_AMBULATORY_CARE_PROVIDER_SITE_OTHER): Payer: Self-pay | Admitting: *Deleted

## 2010-03-02 LAB — CONVERTED CEMR LAB
CO2: 26 meq/L
Calcium: 9.7 mg/dL (ref 8.4–10.5)
Chloride: 102 meq/L
Glucose, Bld: 240 mg/dL
Glucose, Bld: 240 mg/dL — ABNORMAL HIGH (ref 70–99)
Potassium: 4.5 meq/L
Potassium: 4.5 meq/L (ref 3.5–5.3)
Sodium: 141 meq/L
Sodium: 141 meq/L (ref 135–145)

## 2010-03-05 ENCOUNTER — Encounter: Payer: Self-pay | Admitting: Cardiology

## 2010-03-15 ENCOUNTER — Encounter (INDEPENDENT_AMBULATORY_CARE_PROVIDER_SITE_OTHER): Payer: Self-pay | Admitting: *Deleted

## 2010-04-16 IMAGING — CR DG CHEST 2V
2 series · 2 of 2 positions shown · non-contrast
Comparison: 12/21/2007

CLINICAL DATA: Persistent cough, congestion

CHEST - 2 VIEW

[view not recorded (1 of 2)]
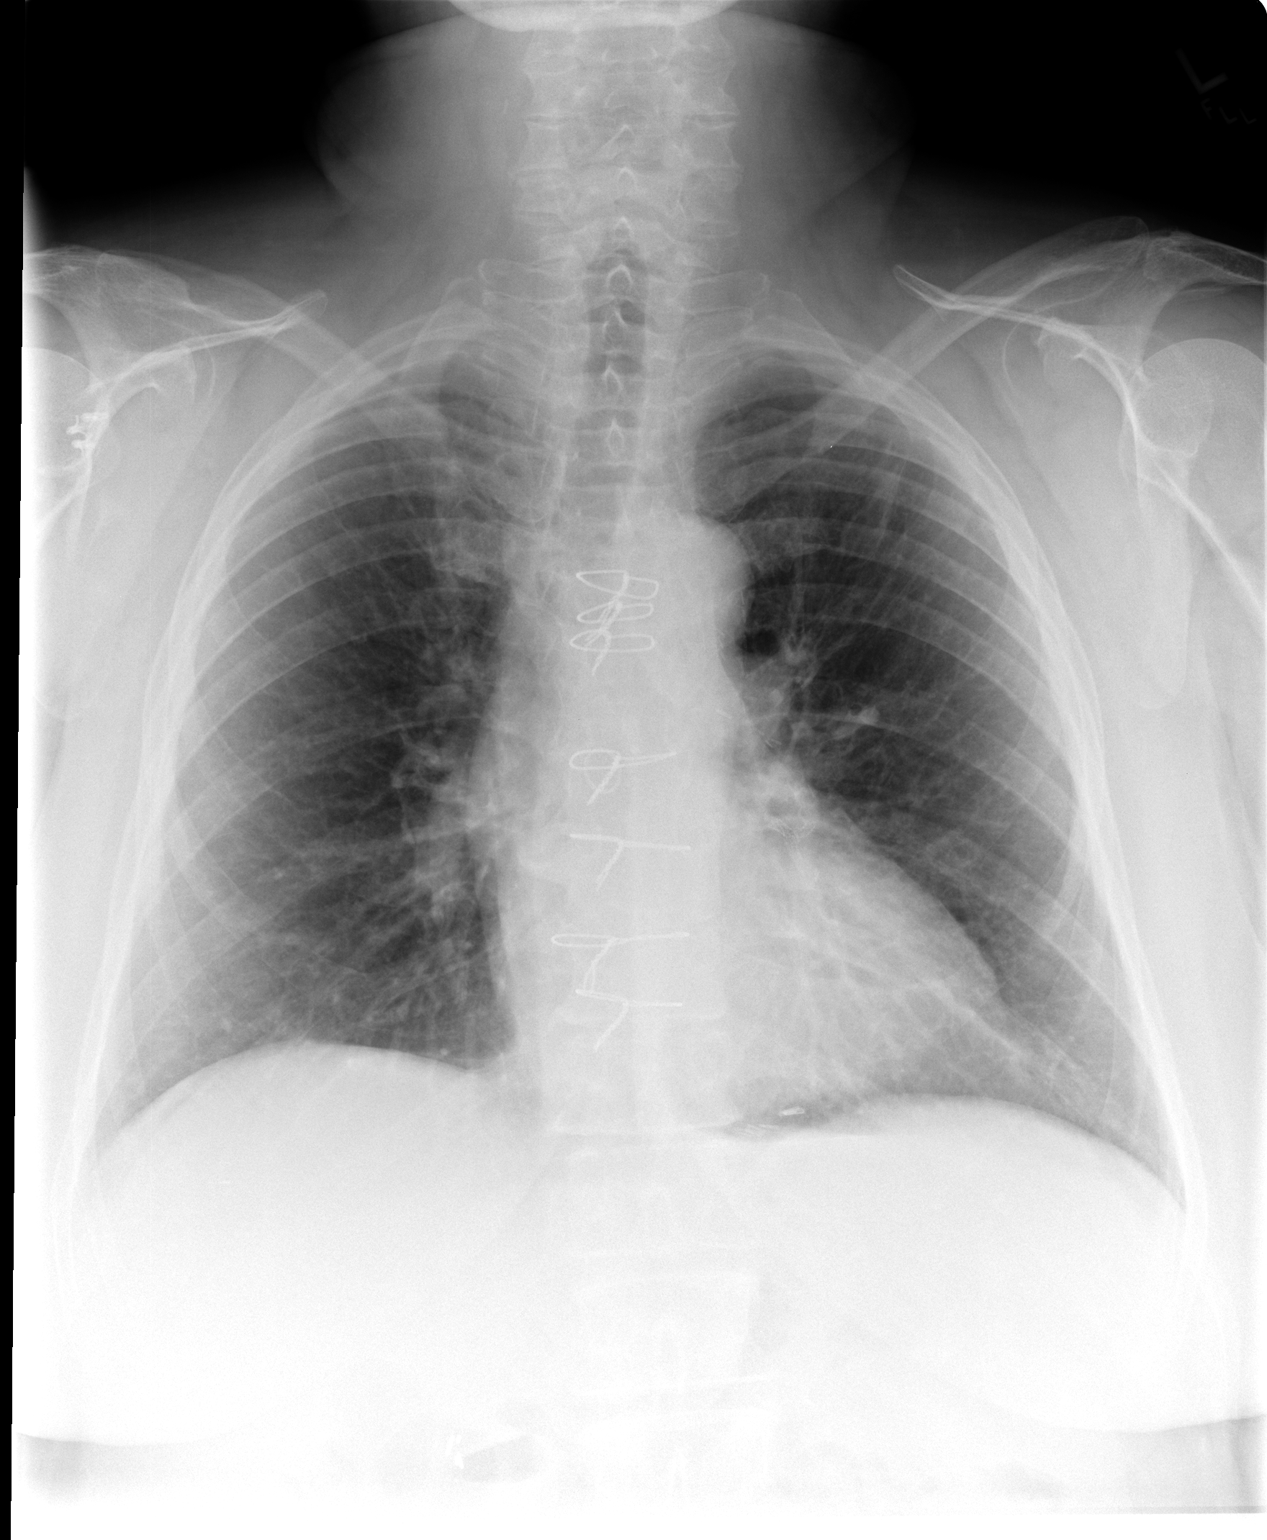

[view not recorded (2 of 2)]
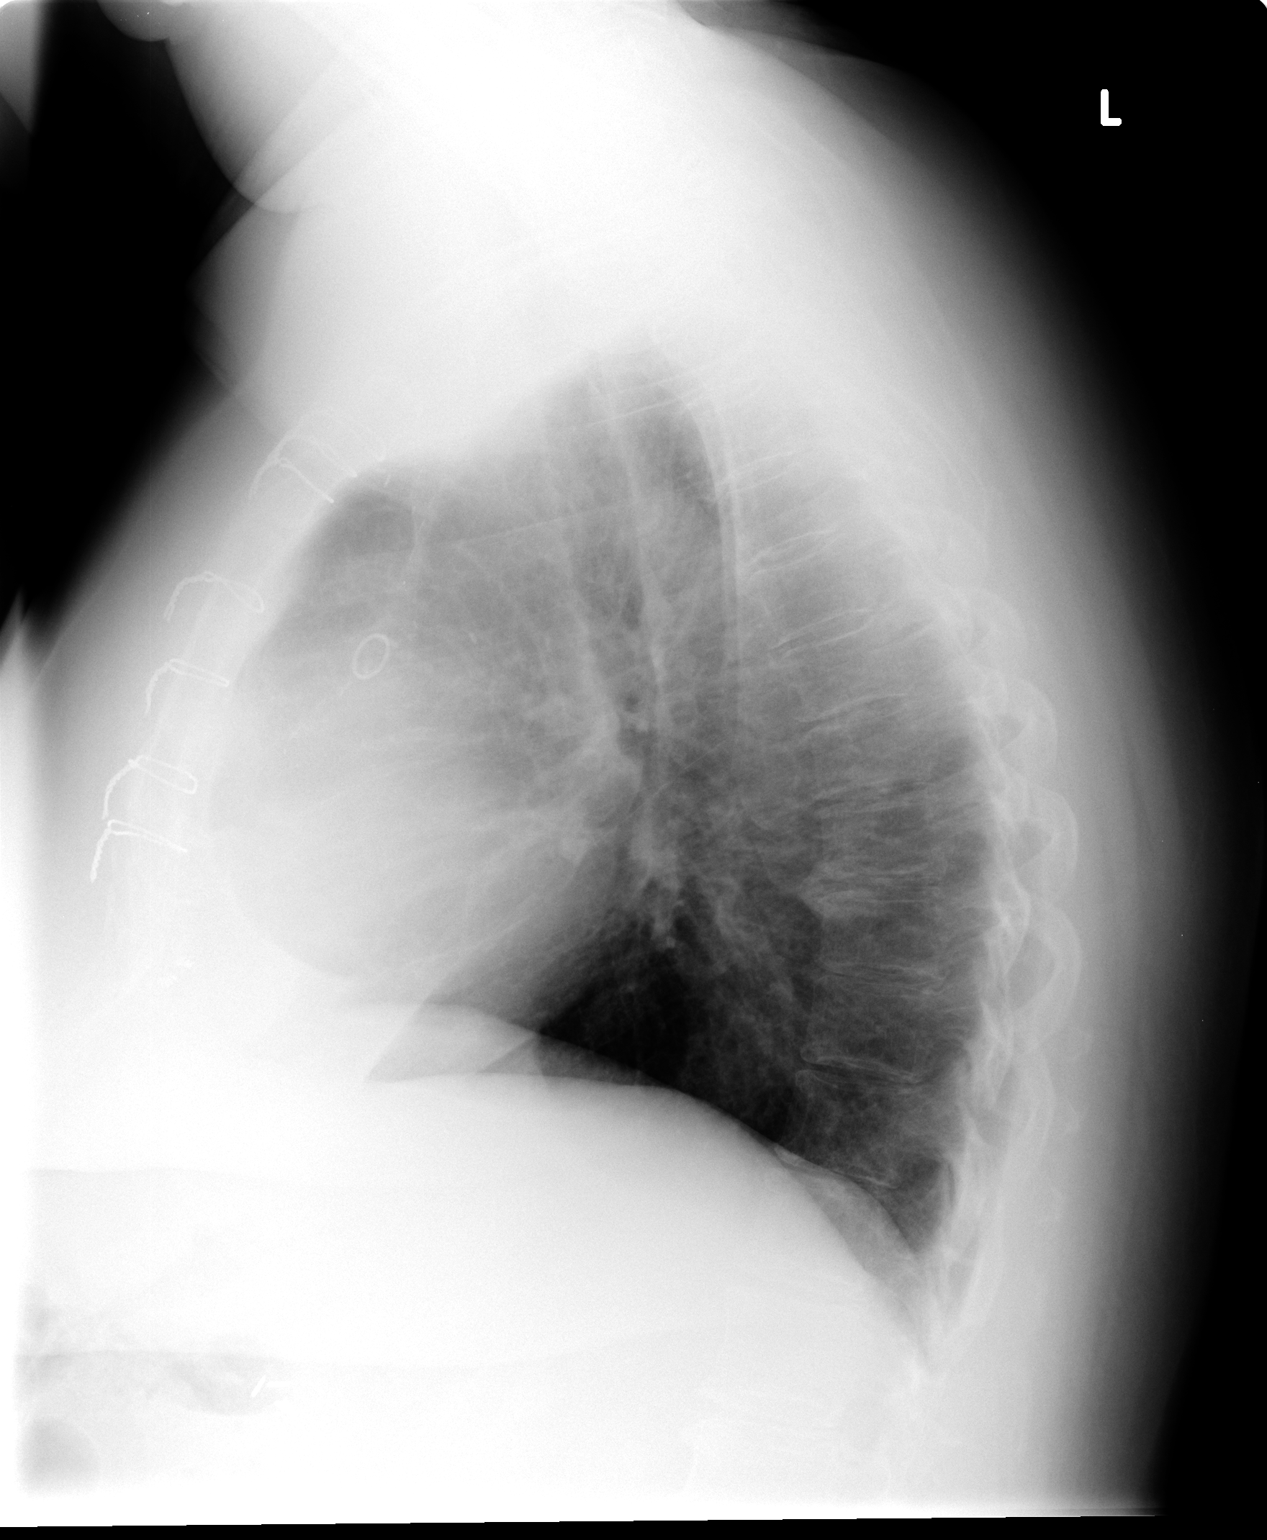

[2 of 2 positions shown; findings below may reference images not displayed]

FINDINGS: Normal heart size status post CABG.
Normal mediastinal contours and pulmonary vascularity.
Mild chronic peribronchial thickening without pulmonary infiltrate
or pleural effusion.
Coronary arterial stents noted.
Spur formation vertebral endplates thoracic spine.
Osteoporosis.
IMPRESSION: Status post CABG and coronary PTCA.
Mild chronic bronchitic changes.

## 2010-04-23 IMAGING — CR DG FOREARM 2V*R*
1 series · 1 of 1 positions shown · non-contrast
Comparison: None

CLINICAL DATA: Fall

RIGHT FOREARM - 1 VIEW

[view not recorded]
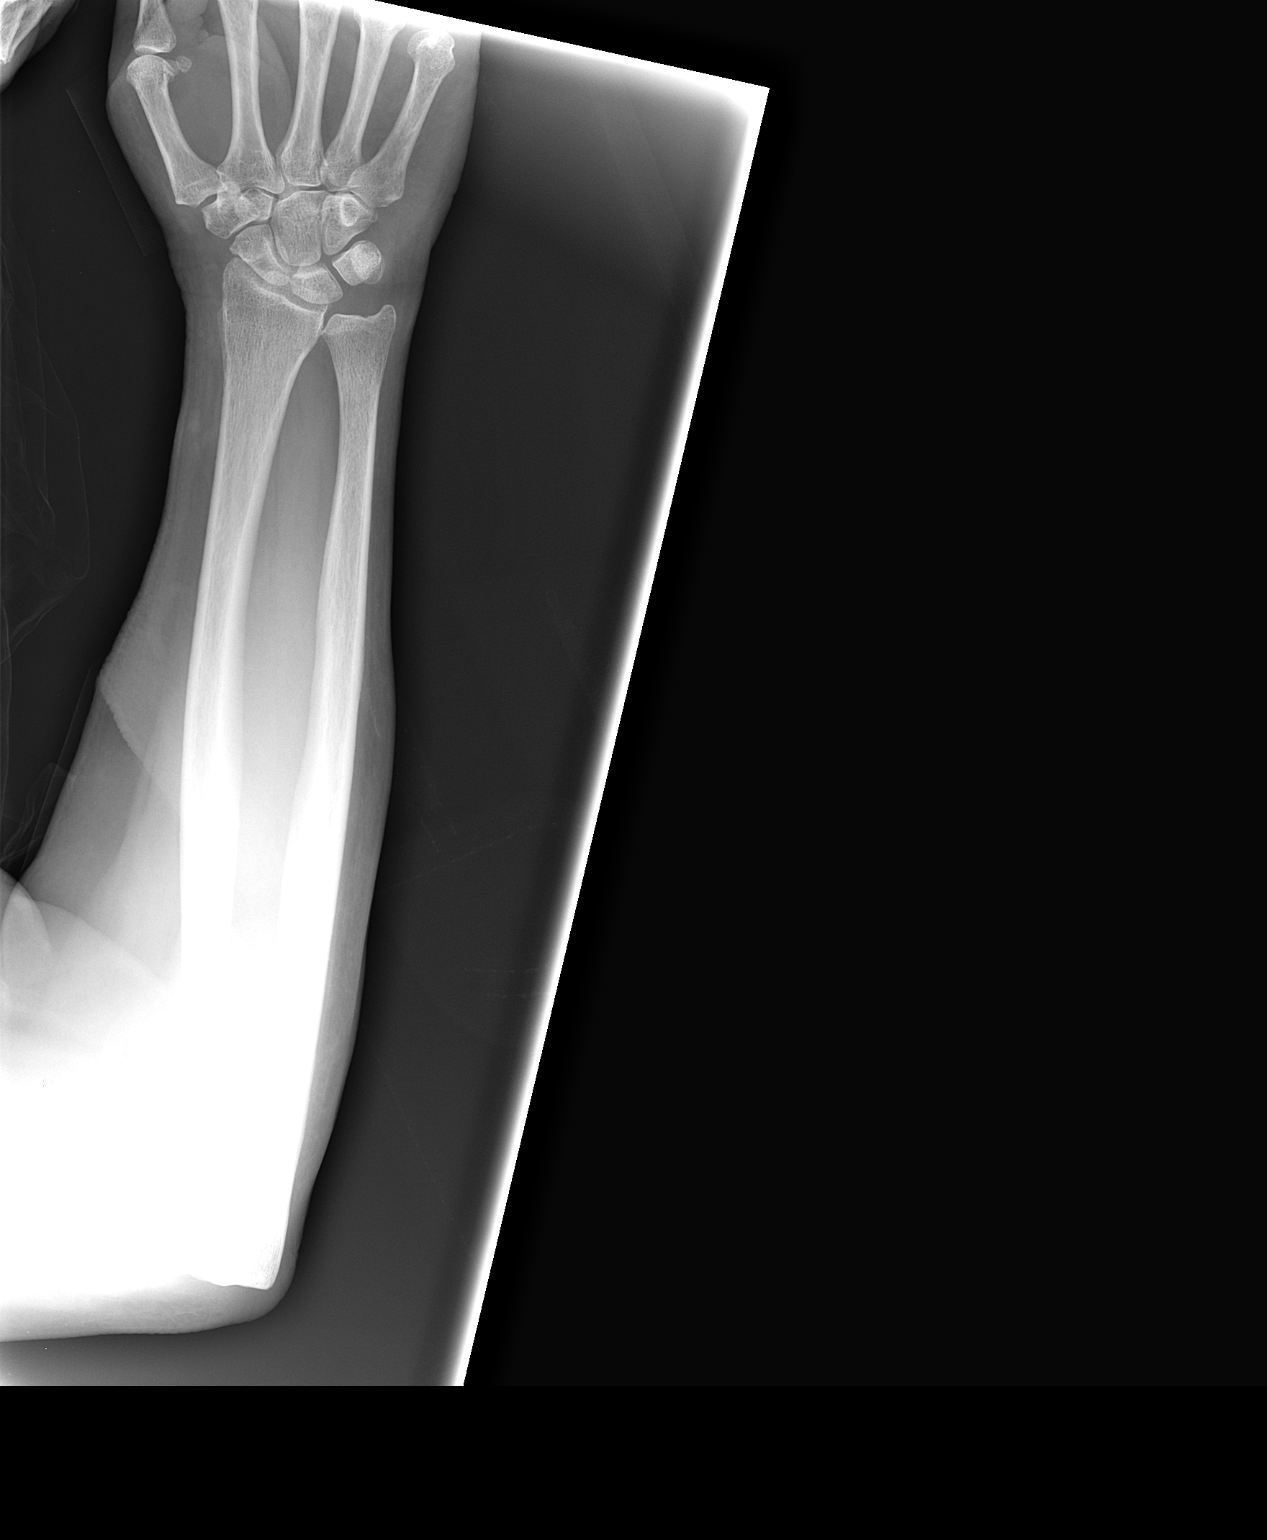

[1 of 1 positions shown; findings below may reference images not displayed]

FINDINGS: A single view only was obtained due to the humeral
fracture.  An AP view of the forearm reveals no fracture or
dislocation.
IMPRESSION: Negative

## 2010-04-23 IMAGING — CR DG SHOULDER 2+V*R*
3 series · 3 of 3 positions shown · non-contrast
Comparison: None

CLINICAL DATA: Fall

RIGHT SHOULDER - 2+ VIEW

[t shoulder ap internal righ]
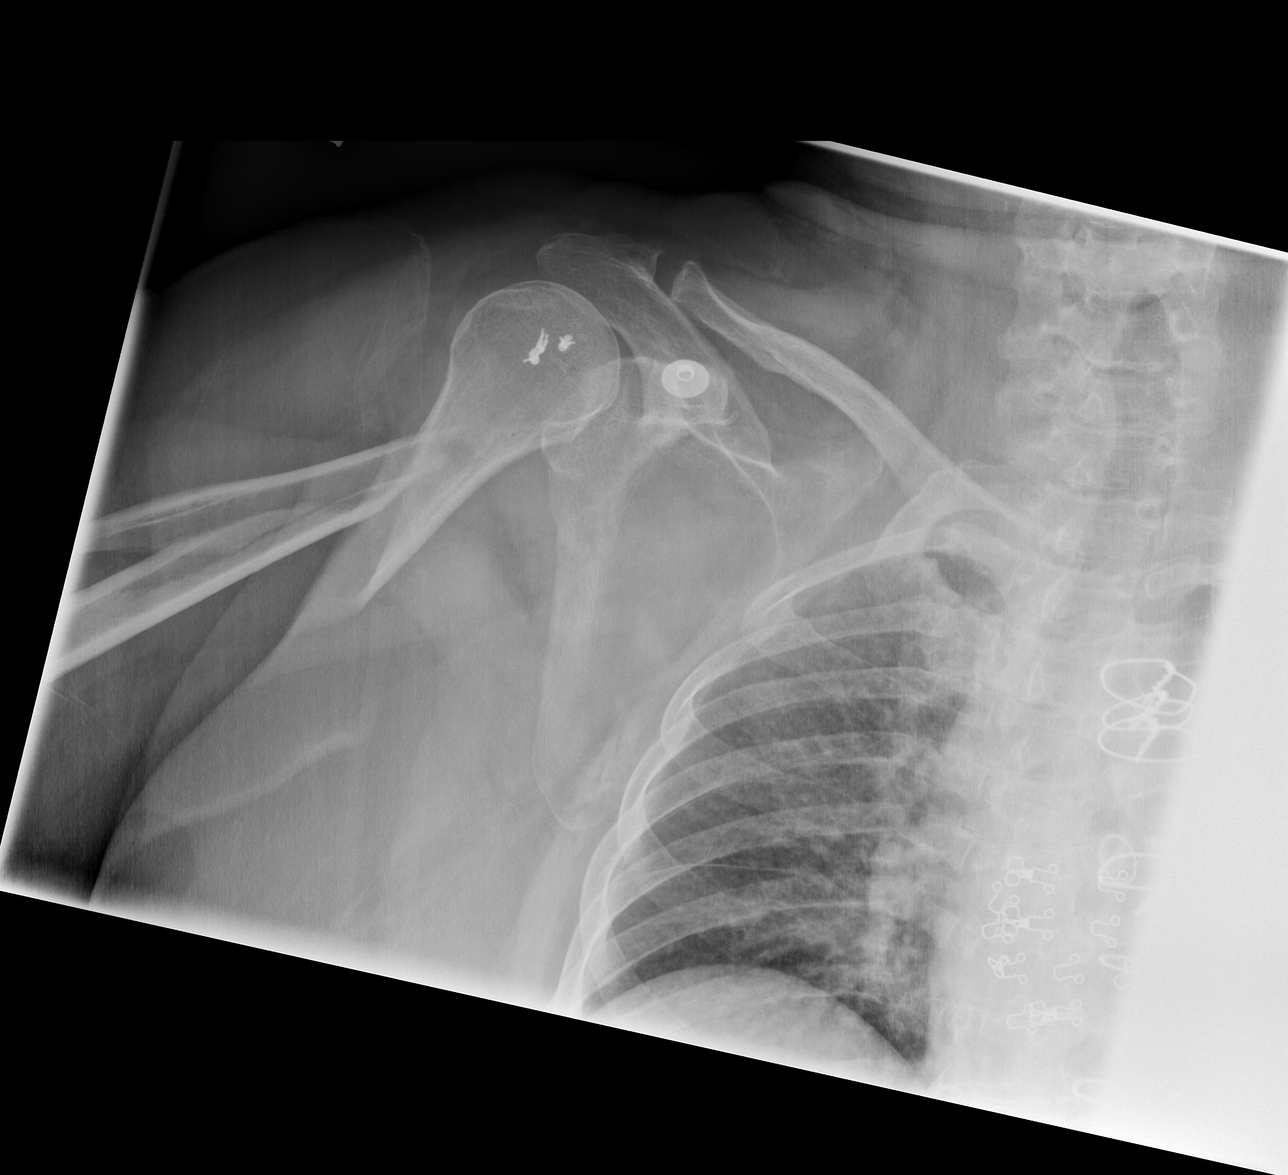

[t shoulder ap external righ]
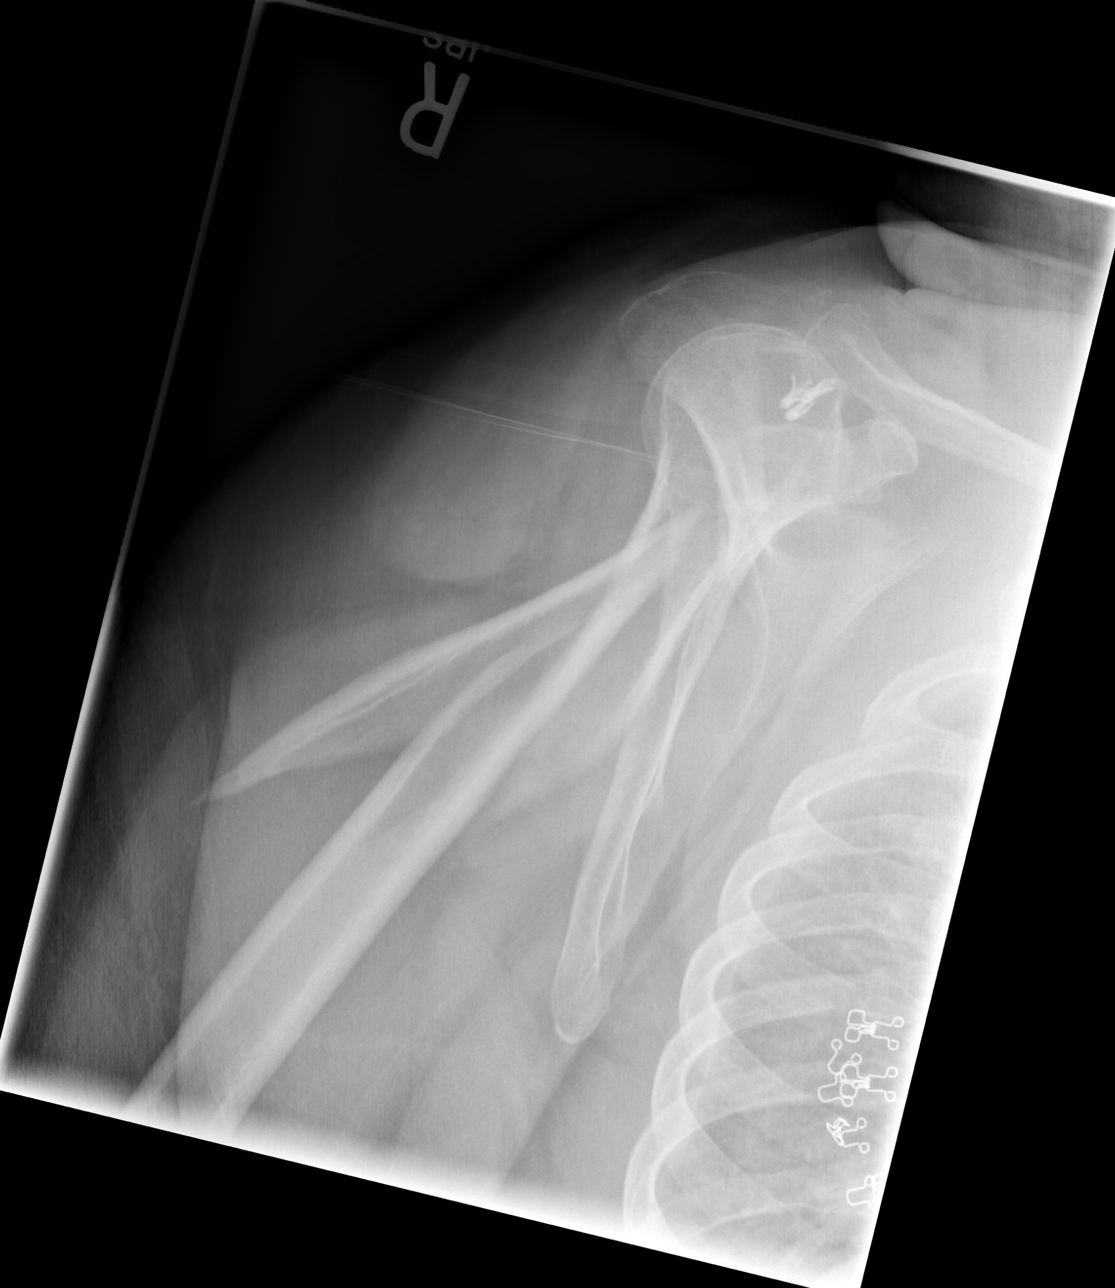

[t shoulder ap external righ *]
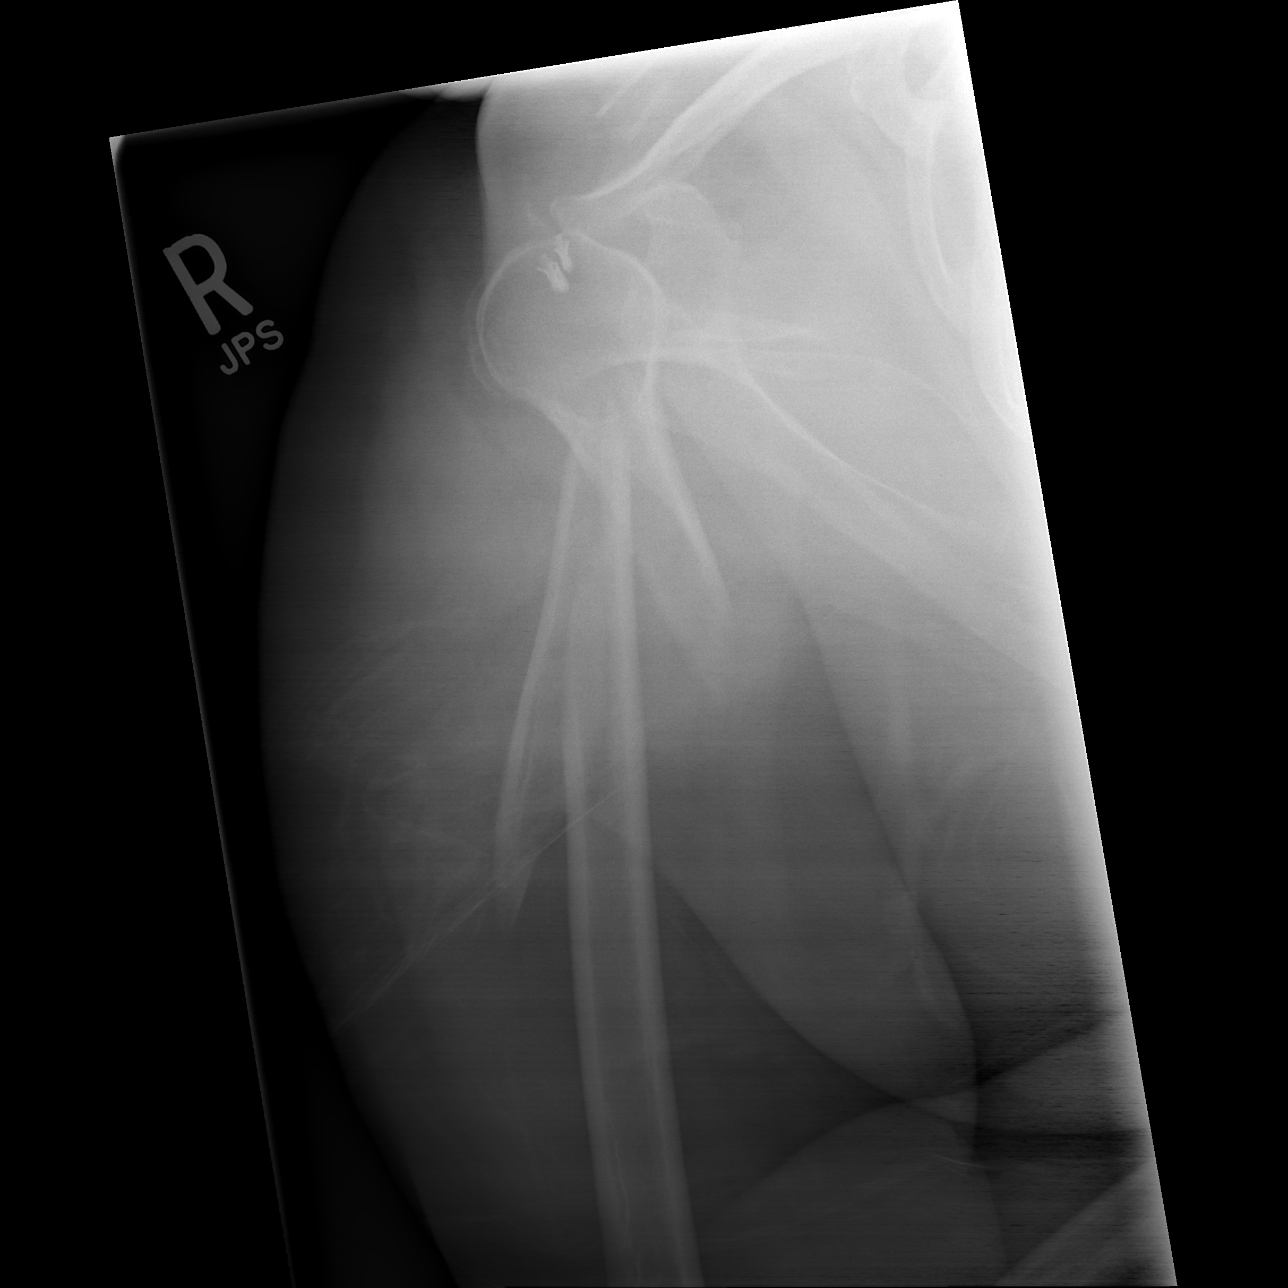

[3 of 3 positions shown; findings below may reference images not displayed]

FINDINGS: There is a comminuted fracture the proximal humerus below
the humeral neck.  There is moderate angulation and displacement.
There are three metal anchors in the proximal humerus from prior
rotator cuff repair.  There is no dislocation.  The AC joint is
mildly widened.  No other fractures.
IMPRESSION: There is a comminuted, angulated, and displaced fracture of the
proximal humeral shaft.

## 2010-04-23 IMAGING — CR DG CHEST 1V PORT
1 series · 1 of 1 positions shown · non-contrast
Comparison: 04/11/2008

CLINICAL DATA: Fall.  Chest pain.

PORTABLE CHEST - 1 VIEW

[AP]
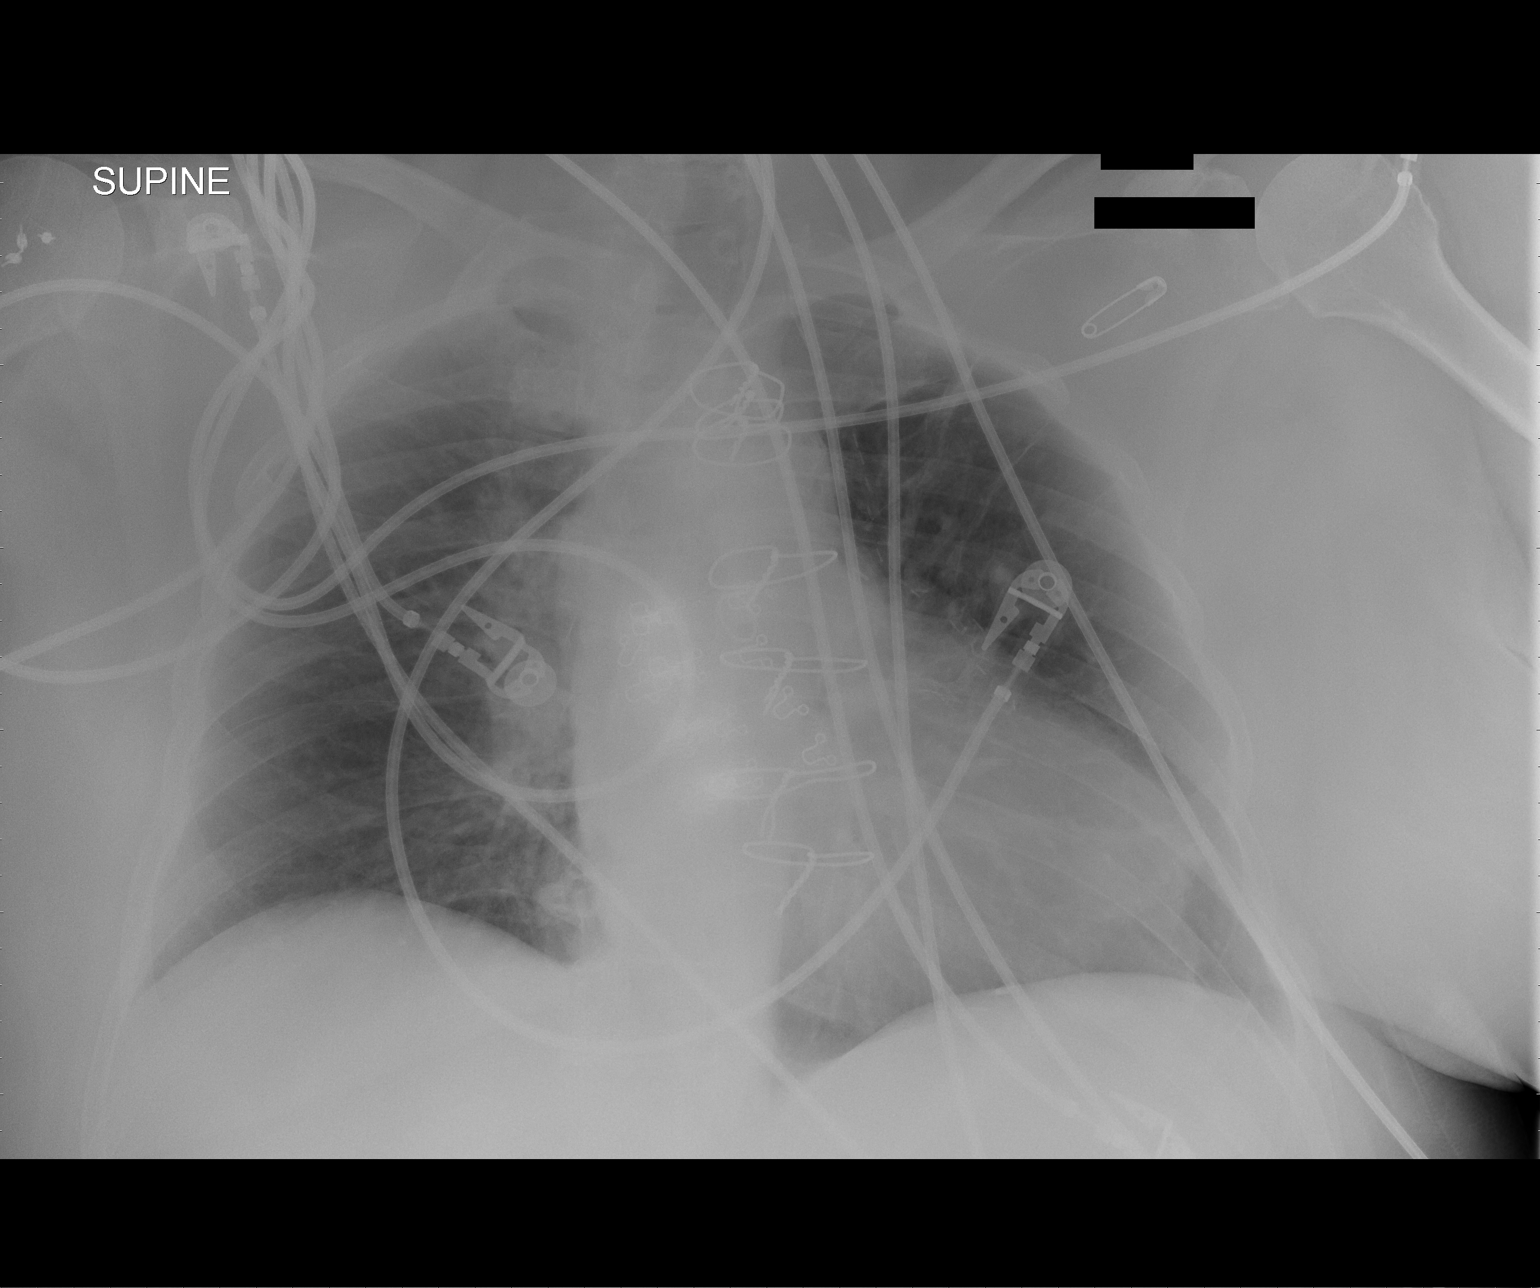

[1 of 1 positions shown; findings below may reference images not displayed]

FINDINGS: Postop CABG.  The heart is enlarged.  Negative for heart
failure.  There is no infiltrate or effusion.
IMPRESSION: No active cardiopulmonary disease.

## 2010-04-23 IMAGING — RF DG HUMERUS 2V *R*
1 series · 4 of 4 positions shown · non-contrast
Comparison: 04/18/2008

CLINICAL DATA: Fracture of the right humerus

RIGHT HUMERUS - 2+ VIEW

[Series 1: run · 4 of 4 slices shown]
[im 1/4]
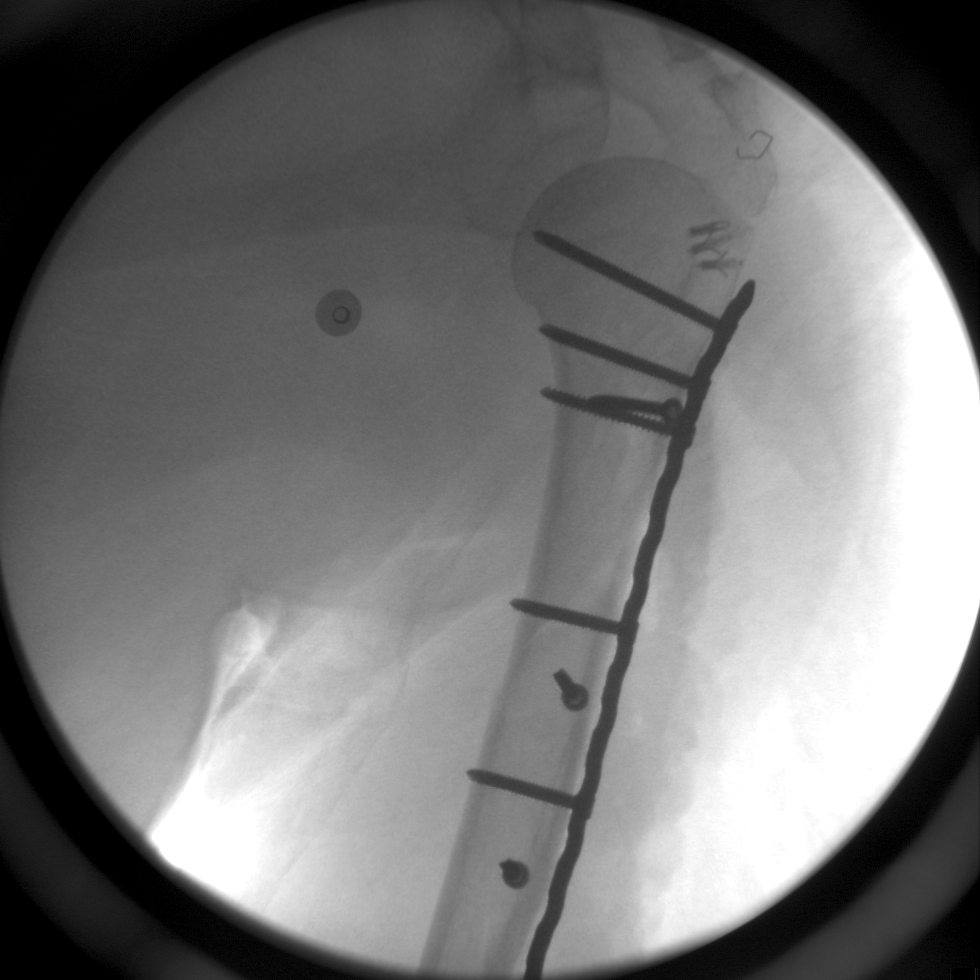
[im 2/4]
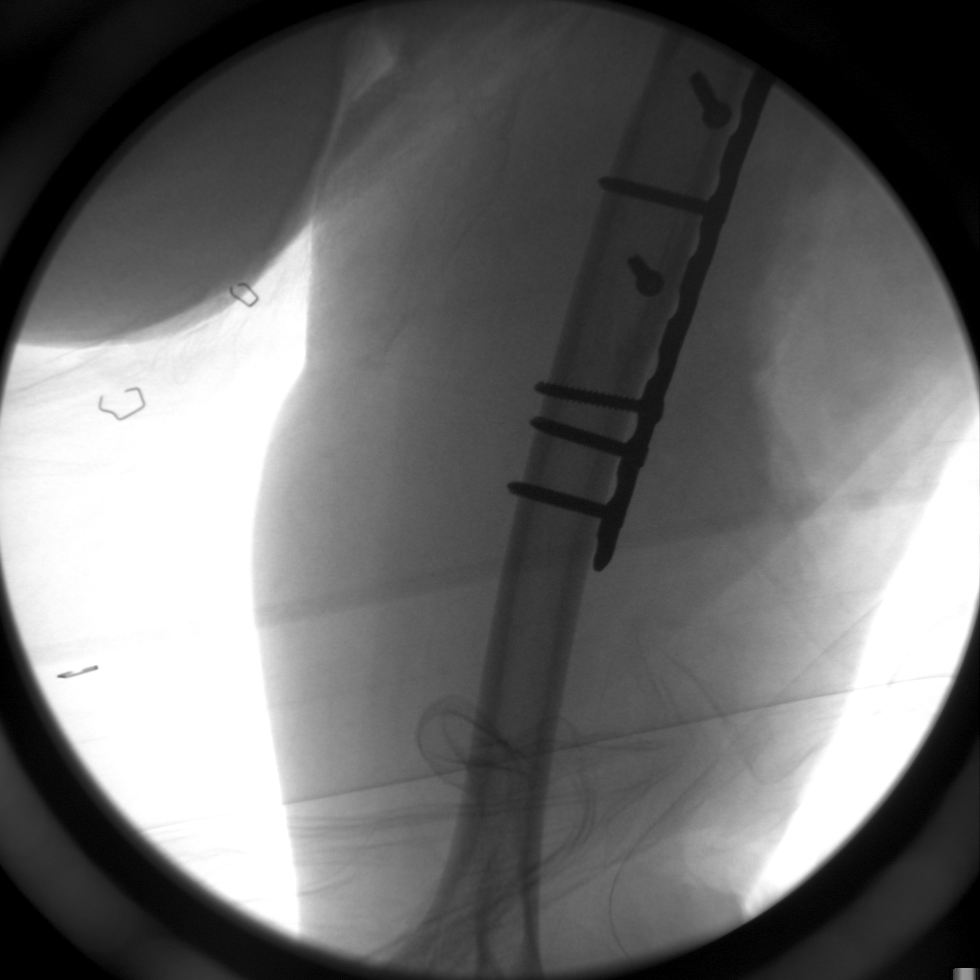
[im 3/4]
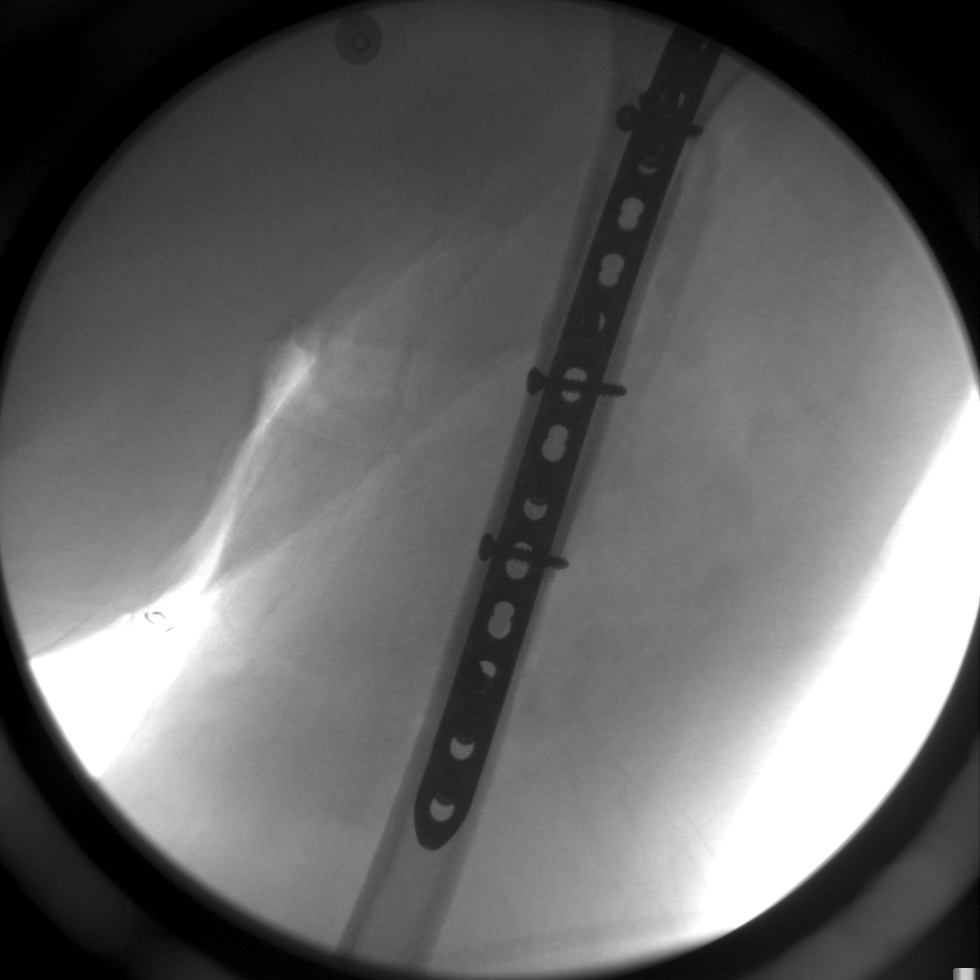
[im 4/4]
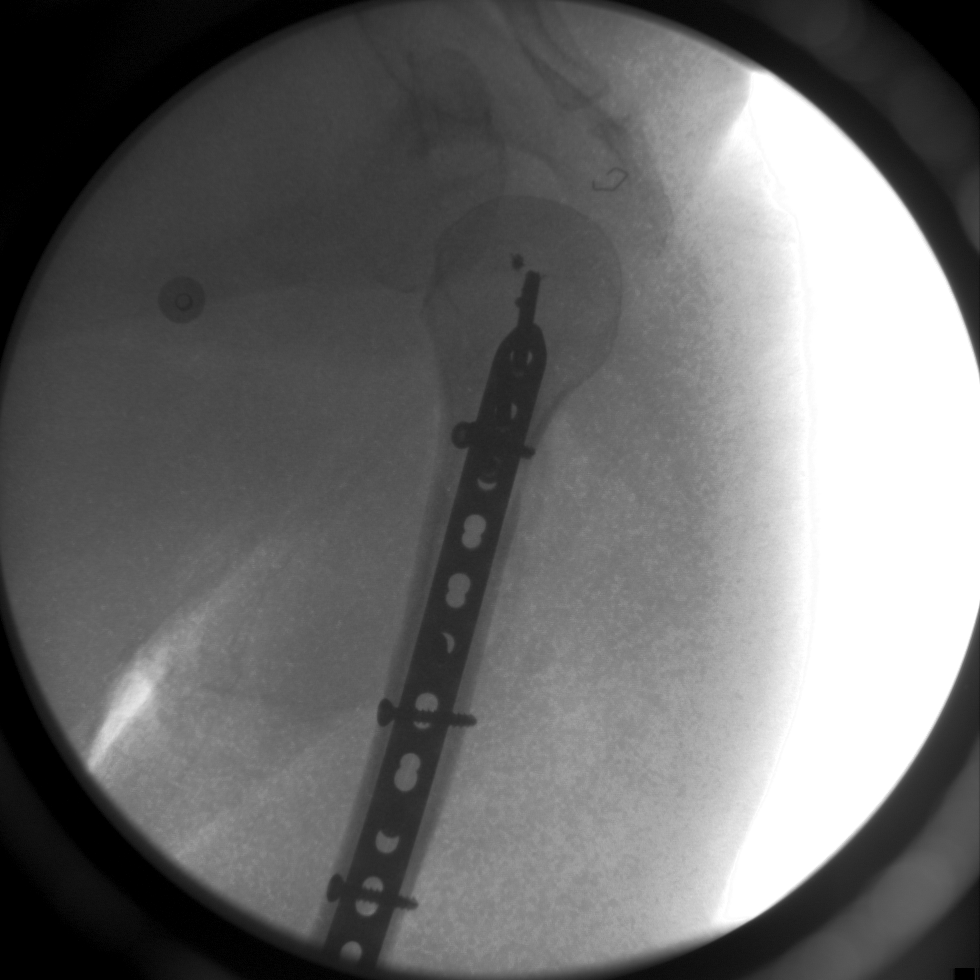

[4 of 4 positions shown; findings below may reference images not displayed]

FINDINGS: The patient is status post internal repair of the right
humeral fracture.  A metallic plate and multiple screws are seen in
proximal and mid right humerus.  Three metallic anchors are seen in
the right humeral head.  There is near anatomical alignment.
IMPRESSION: Status post internal repair of the right humeral fracture.
Metallic plate and screws in place .  There  is near anatomic
alignment.

## 2010-04-23 IMAGING — CR DG ELBOW 2V*R*
2 series · 2 of 2 positions shown · non-contrast
Comparison: None

CLINICAL DATA: Fall

RIGHT ELBOW - 2 VIEW

[view not recorded (1 of 2)]
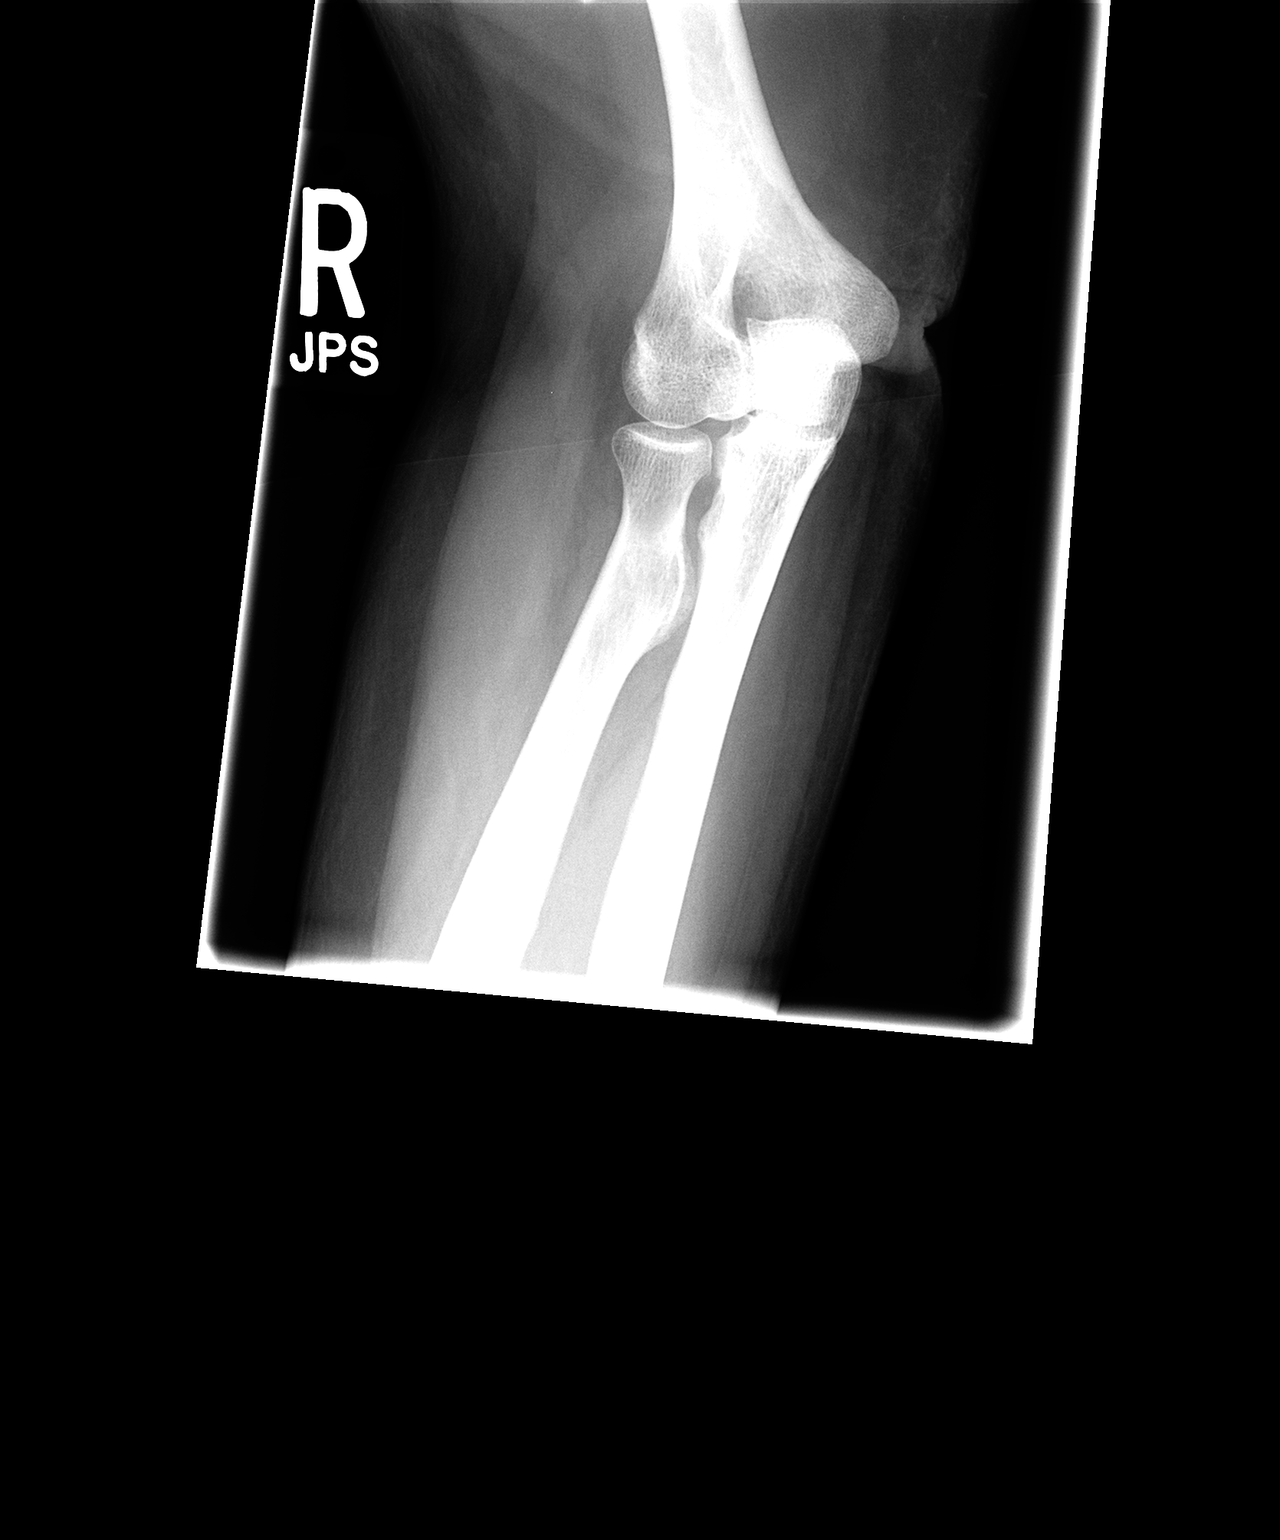

[view not recorded (2 of 2)]
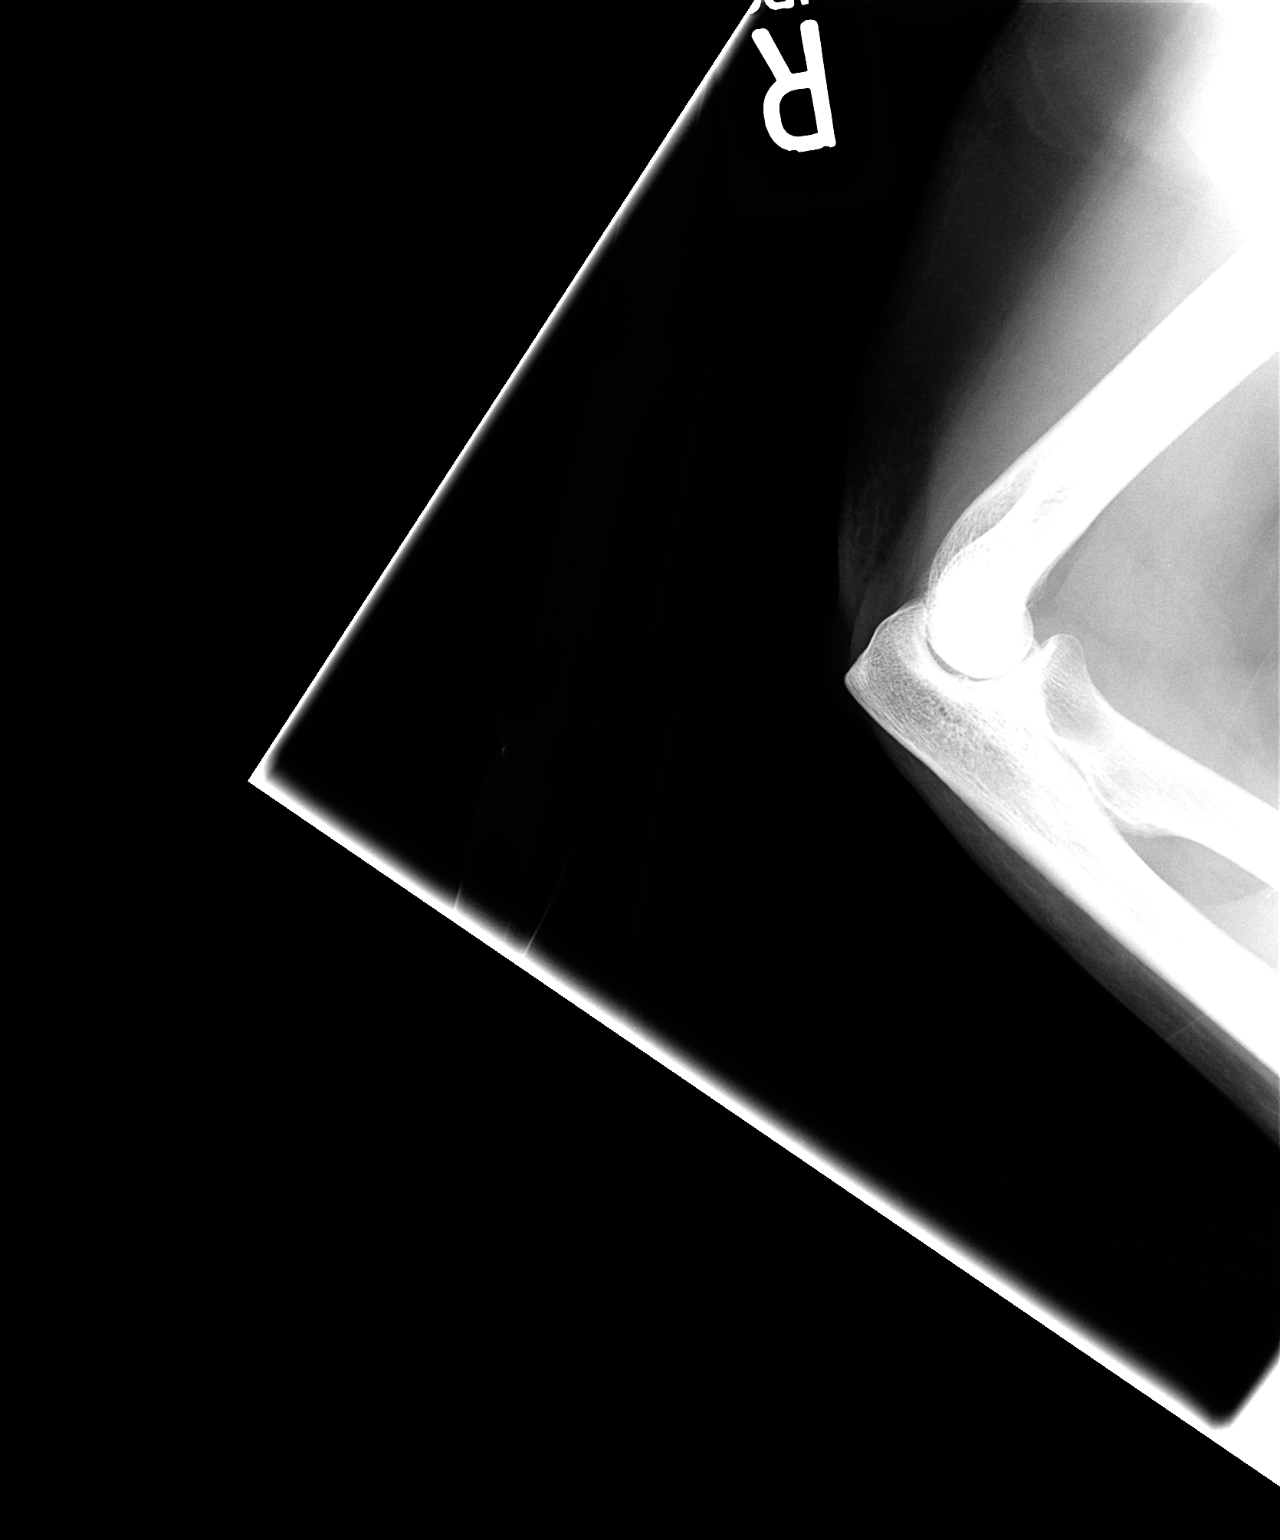

[2 of 2 positions shown; findings below may reference images not displayed]

FINDINGS: There is no evidence of fracture dislocation or joint
effusion.  There is no evidence of arthropathy or other focal bone
abnormality.  Soft tissues are unremarkable.
IMPRESSION: Negative.

## 2010-05-16 ENCOUNTER — Emergency Department (HOSPITAL_COMMUNITY): Admission: EM | Admit: 2010-05-16 | Discharge: 2010-05-16 | Payer: Self-pay | Admitting: Emergency Medicine

## 2010-06-08 ENCOUNTER — Encounter (INDEPENDENT_AMBULATORY_CARE_PROVIDER_SITE_OTHER): Payer: Self-pay | Admitting: *Deleted

## 2010-07-19 ENCOUNTER — Ambulatory Visit (HOSPITAL_COMMUNITY): Admission: RE | Admit: 2010-07-19 | Discharge: 2010-07-19 | Payer: Self-pay | Admitting: Family Medicine

## 2010-08-23 ENCOUNTER — Ambulatory Visit: Payer: Self-pay | Admitting: Cardiology

## 2010-08-30 ENCOUNTER — Emergency Department (HOSPITAL_COMMUNITY): Admission: EM | Admit: 2010-08-30 | Discharge: 2010-08-31 | Payer: Self-pay | Admitting: Emergency Medicine

## 2010-09-07 ENCOUNTER — Encounter (INDEPENDENT_AMBULATORY_CARE_PROVIDER_SITE_OTHER): Payer: Self-pay | Admitting: *Deleted

## 2010-09-07 LAB — CONVERTED CEMR LAB
ALT: 17 units/L (ref 0–35)
Alkaline Phosphatase: 76 units/L (ref 39–117)
Basophils Absolute: 0 10*3/uL (ref 0.0–0.1)
CO2: 28 meq/L (ref 19–32)
Cholesterol: 227 mg/dL — ABNORMAL HIGH (ref 0–200)
Creatinine, Ser: 0.74 mg/dL (ref 0.40–1.20)
Eosinophils Absolute: 0.3 10*3/uL (ref 0.0–0.7)
Eosinophils Relative: 4 % (ref 0–5)
HCT: 47 % — ABNORMAL HIGH (ref 36.0–46.0)
Hemoglobin: 15.3 g/dL — ABNORMAL HIGH (ref 12.0–15.0)
LDL Cholesterol: 126 mg/dL — ABNORMAL HIGH (ref 0–99)
MCHC: 32.6 g/dL (ref 30.0–36.0)
MCV: 92 fL (ref 78.0–100.0)
Monocytes Absolute: 0.7 10*3/uL (ref 0.1–1.0)
Platelets: 267 10*3/uL (ref 150–400)
RDW: 13.4 % (ref 11.5–15.5)
Sodium: 139 meq/L (ref 135–145)
Total Bilirubin: 0.5 mg/dL (ref 0.3–1.2)
Total CHOL/HDL Ratio: 5.8
Total Protein: 6.6 g/dL (ref 6.0–8.3)
Triglycerides: 311 mg/dL — ABNORMAL HIGH (ref <150)
VLDL: 62 mg/dL — ABNORMAL HIGH (ref 0–40)

## 2010-10-11 ENCOUNTER — Ambulatory Visit (HOSPITAL_COMMUNITY): Admission: RE | Admit: 2010-10-11 | Discharge: 2010-10-11 | Payer: Self-pay | Admitting: Family Medicine

## 2010-10-11 LAB — CONVERTED CEMR LAB
HDL: 44 mg/dL (ref >39)
LDL Cholesterol: 112 mg/dL — ABNORMAL HIGH (ref 0–99)

## 2010-10-12 ENCOUNTER — Encounter (INDEPENDENT_AMBULATORY_CARE_PROVIDER_SITE_OTHER): Payer: Self-pay | Admitting: *Deleted

## 2010-10-17 ENCOUNTER — Ambulatory Visit (HOSPITAL_COMMUNITY): Admission: RE | Admit: 2010-10-17 | Discharge: 2010-10-17 | Payer: Self-pay | Admitting: Family Medicine

## 2010-10-30 ENCOUNTER — Encounter: Admission: RE | Admit: 2010-10-30 | Discharge: 2010-10-30 | Payer: Self-pay | Admitting: Family Medicine

## 2010-11-08 IMAGING — CR DG CHEST 1V PORT
1 series · 1 of 1 positions shown · non-contrast
Comparison: 04/19/2008

CLINICAL DATA: Chest pain.  Short of breath.  Cough.

PORTABLE CHEST - 1 VIEW

[view not recorded]
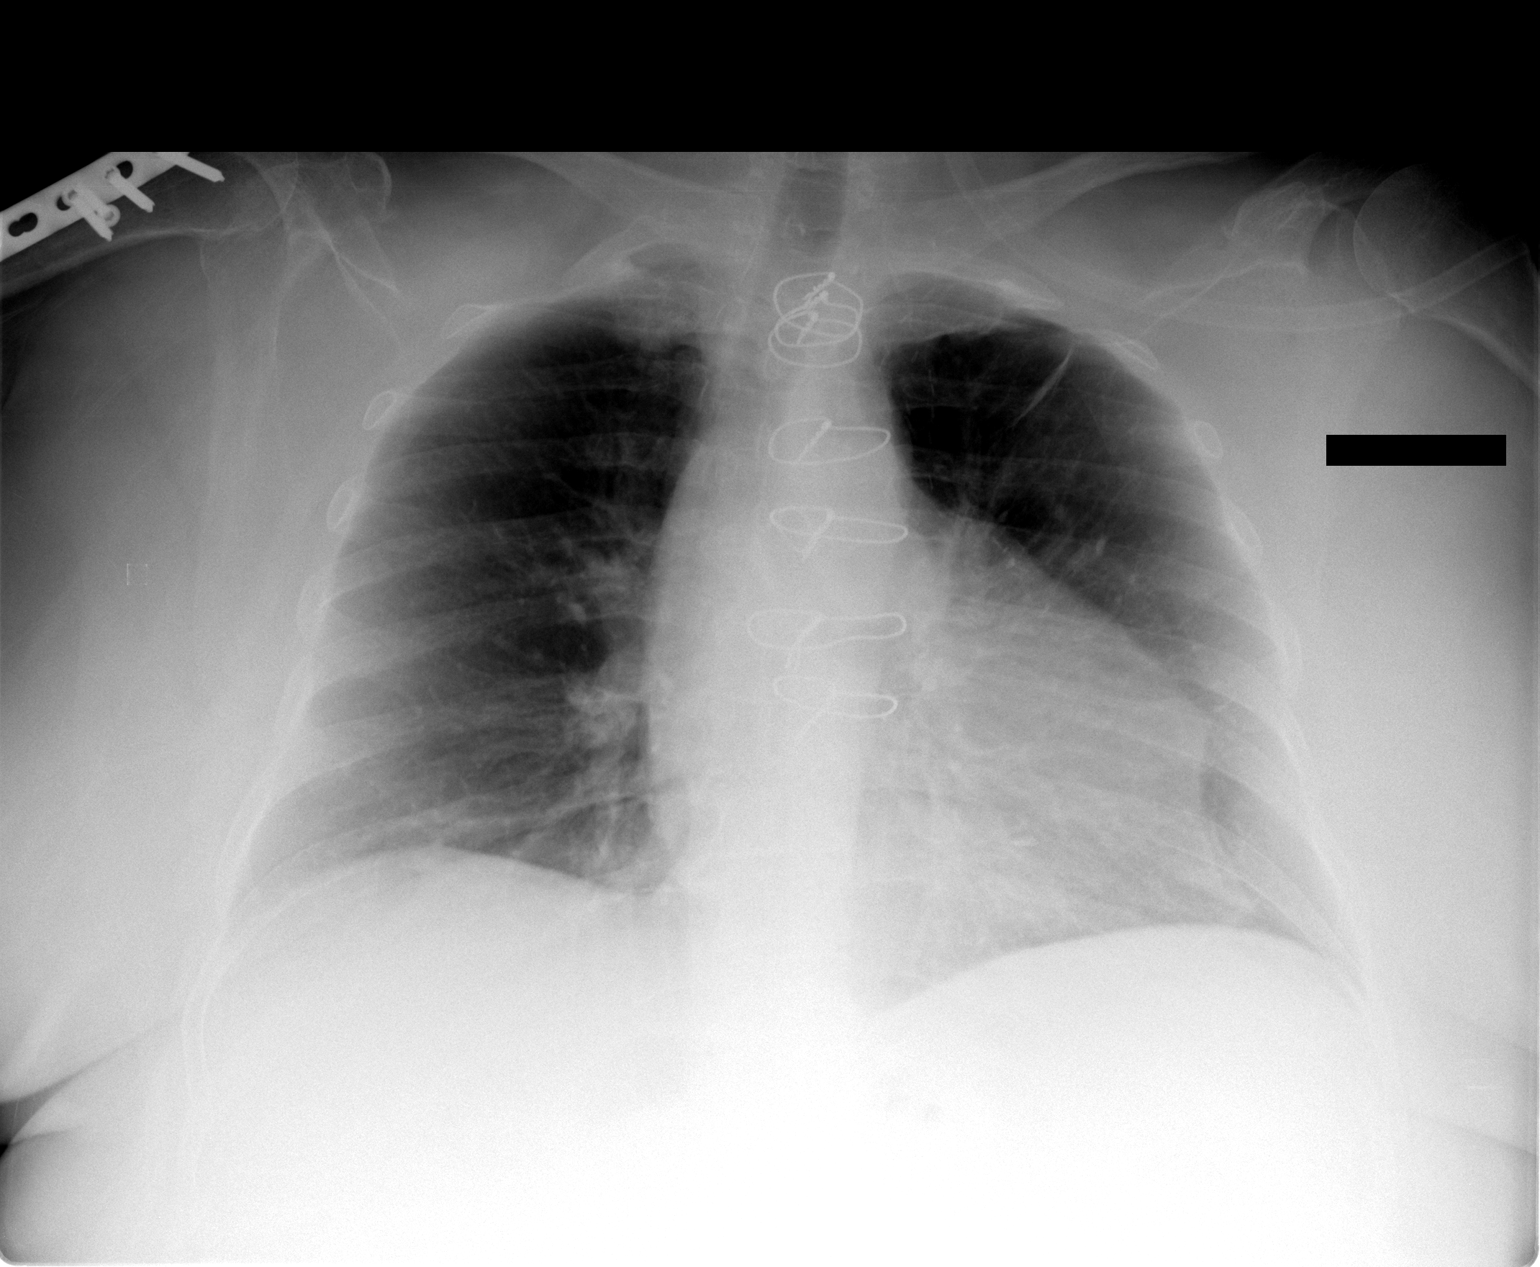

[1 of 1 positions shown; findings below may reference images not displayed]

FINDINGS: Previous median sternotomy.  Heart size upper limits of
normal.  A few linear pulmonary scars but no evidence of
consolidation or collapse.  No apparent effusions.
IMPRESSION: A few pulmonary scars.  No active disease evident.

## 2011-01-12 ENCOUNTER — Encounter: Payer: Self-pay | Admitting: *Deleted

## 2011-01-12 ENCOUNTER — Encounter: Payer: Self-pay | Admitting: Family Medicine

## 2011-01-13 ENCOUNTER — Encounter: Payer: Self-pay | Admitting: Family Medicine

## 2011-01-14 ENCOUNTER — Other Ambulatory Visit (HOSPITAL_COMMUNITY): Payer: Self-pay | Admitting: Family Medicine

## 2011-01-14 DIAGNOSIS — Z139 Encounter for screening, unspecified: Secondary | ICD-10-CM

## 2011-01-22 NOTE — Letter (Signed)
Summary: [place] [facility], [facility] at [employer]  [address] [place] [address]   Phone: [phone]  Fax: [phone]     September 07, 2010 MRN: [id number]   [facility] [place] [facility] [address] [place], [place]  [address]      YOUR LAB WORK IS DUE   Monday   October 08, 2010  Please go to [facility], located across the street from [facility] on the second floor.  Hours are Monday - Friday 7am until 7:30pm         Saturday 8am until 12noon    _x_  DO NOT EAT OR DRINK AFTER MIDNIGHT EVENING PRIOR TO LABWORK  __ YOUR LABWORK IS NOT FASTING --YOU MAY EAT PRIOR TO LABWORK

## 2011-01-22 NOTE — Miscellaneous (Signed)
Summary: hospital labs 01/19/2010-01/20/2010  Clinical Lists Changes  Observations: Added new observation of CALCIUM: 8.5 mg/dL (04/54/0981 19:14) Added new observation of GFR AA: >60 mL/min/1.54m2 (01/20/2010 16:28) Added new observation of GFR: >60 mL/min (01/20/2010 16:28) Added new observation of CREATININE: 0.75 mg/dL (78/29/5621 30:86) Added new observation of BUN: 9 mg/dL (57/84/6962 95:28) Added new observation of BG RANDOM: 274 mg/dL (41/32/4401 02:72) Added new observation of CO2 PLSM/SER: 28 meq/L (01/20/2010 16:28) Added new observation of CL SERUM: 101 meq/L (01/20/2010 16:28) Added new observation of K SERUM: 4.0 meq/L (01/20/2010 16:28) Added new observation of NA: 134 meq/L (01/20/2010 16:28) Added new observation of CALCIUM: 9.6 mg/dL (53/66/4403 47:42) Added new observation of GFR AA: >60 mL/min/1.65m2 (01/19/2010 16:28) Added new observation of GFR: >60 mL/min (01/19/2010 16:28) Added new observation of CREATININE: 0.65 mg/dL (59/56/3875 64:33) Added new observation of BUN: 8 mg/dL (29/51/8841 66:06) Added new observation of BG RANDOM: 267 mg/dL (30/16/0109 32:35) Added new observation of CO2 PLSM/SER: 29 meq/L (01/19/2010 16:28) Added new observation of CL SERUM: 102 meq/L (01/19/2010 16:28) Added new observation of K SERUM: 4.1 meq/L (01/19/2010 16:28) Added new observation of NA: 137 meq/L (01/19/2010 16:28)

## 2011-01-22 NOTE — Letter (Signed)
Summary: Appointment - Missed  [facility] [place]  [address], [place] [address]   Phone: [phone]  Fax: [phone]     March 15, 2010 MRN: [id number]   [facility] [address] [place], [place]  [address]   Dear [name] [name],  Our records indicate you missed your appointment on  03/15/10 NURSE VISIT.                                    It is very important that we reach you to reschedule this appointment. We look forward to participating in your health care needs. Please contact us at the number listed above at your earliest convenience to reschedule this appointment.     Sincerely,    Glass blower/designer

## 2011-01-22 NOTE — Letter (Signed)
Summary: [place] [facility], [facility] at [employer]  [address], [place] [address]   Phone: [phone]  Fax: [phone]     October 12, 2010 MRN: [id number]   [facility] [address] [place], [place]  [address]      YOUR LAB WORK IS DUE   November 12, 2010  Please go to [facility], located across the street from [facility] on the second floor.  Hours are Monday - Friday 7am until 7:30pm         Saturday 8am until 12noon    _X_  DO NOT EAT OR DRINK AFTER MIDNIGHT EVENING PRIOR TO LABWORK

## 2011-01-22 NOTE — Assessment & Plan Note (Signed)
Summary: F4M   Visit Type:  Follow-up Primary [name] [name]:  Dr.Angus Mcinnis   History of Present Illness: [name] returns to the office as scheduled for continued assessment and treatment of coronary disease and cardiovascular risk factors.  Since her last visit, simvastatin was discontinued-she does not know why.  She was experiencing myalgias when I last saw her at which time her dose of simvastatin was halved to 20 mg q.d.  She has had no chest discomfort, no orthopnea, no PND, no dyspnea, no lightheadedness and no syncope.  Weight has been stable.  Her most prominent complaint is intermittent pain in the left lower leg with decreased sensation.  A recent venous Doppler study was reportedly negative.  Current Medications (verified): 1)  Aspir-Low 81 Mg Tbec (Aspirin) .... Take 1 Tab Daily 2)  Metformin Hcl 500 Mg Xr24h-Tab (Metformin Hcl) .... Take 1 Tab Two Times A Day 3)  Levothyroxine Sodium 25 Mcg Tabs (Levothyroxine Sodium) .... Take 1 Tab Daily 4)  Enalapril Maleate 10 Mg Tabs (Enalapril Maleate) .... Take 1 Tab Daily 5)  Nitrostat 0.4 Mg Subl (Nitroglycerin) .... Take As Needed For Chest Pain 6)  Lantus 100 Unit/ml Soln (Insulin Glargine) .... Take 55 U Am 55 U Pm 7)  Novolog Flexpen 100 Unit/ml Soln (Insulin Aspart) .... Sliding Scale 8)  Chlorthalidone 25 Mg Tabs (Chlorthalidone) .... Take 1/2 Tablet By Mouth Once Daily  Allergies (verified): 1)  ! Codeine  Past History:  PMH, FH, and Social History reviewed and updated.  Past Medical History: ASCVD: CABG surgery in 2001; nl stress nuclear-2007; angiography in 01/2006- TO of LAD; 70% ostial stenosis      of a small ramus; patent grafts; normal EF; cath in 12/2009 essentially unchanged Congestive heart failure with preserved LV systolic function HYPERLIPIDEMIA (ICD-272.4) HYPERTENSION (ICD-401.9) History of CVA AODM-requires insulin History of ataxia attributed to this tubular neuritis History of acute renal  failure, possibly contrast induced Obesity Chronic obstructive pulmonary disease/chronic bronchitis HYPERTHYROIDISM (ICD-242.90) Obstructive sleep apnea Fall resulting in a right humeral fracture-03/2008 Chronic low back pain Vestibular neuritis Varicose veins Sensory loss in the left lower leg    Review of Systems       See history of present illness.  Vital Signs:  Patient profile:   75 year old female Weight:      221 pounds Pulse rate:   80 / minute BP sitting:   141 / 76  (right arm)  Vitals Entered By: Dreama Saa, CNA (August 23, 2010 10:58 AM)  Physical Exam  General:   obese, Caucasian female in no acute distress  NECK: No JVD;faint bilateral carotid bruits  SKIN:  Warm and dry; pallor noted  HEENT:  normal lids and conjunctiva; EOMs full; normal oral mucosa.  LUNGS:  Overall clear to auscultation bilaterally; decreased breath sounds at the bases  CARDIOVASCULAR:  Regular rate and rhythm; normal first and second heart sounds  NEUROLOGICAL: patch of anesthesia over the left lateral lower leg; normal distal strength  ABDOMEN:  Bowel sounds present, obese, nontender, no rebound or  guarding.   EXTREMITIES: Trace peripheral edema noted.  Pulses are 2+ in all extremities; moderate varicosities throughout both lower legs      Impression & Recommendations:  Problem # 1:  ATHEROSCLEROTIC CARDIOVASCULAR DISEASE (ICD-429.2) Patient remains quite stable 12 years following CABG surgery.  Continued attention will be directed towards optimal control of cardiovascular risk factors.  A chemistry profile and CBC will be checked.  Problem # 2:  HYPERTENSION (ICD-401.9) Blood pressure control is good; current therapy will be continued.  Problem # 3:  HYPERLIPIDEMIA (ICD-272.4) With a history of coronary disease and diabetes, treatment with a statin is almost certainly necessary.  Even if she failed low dose simvastatin, other drugs can be utilized.  We will take this  opportunity to obtain a lipid profile off therapy and then decide on a pharmacologic agent.     Problem # 4:  Lumbosacral spine disease Patient has a history of low back pain and now has an area of anesthesia over the left lower leg.  This is probably related to degenerative disc disease.  Sensation in the foot is fine excluding a peripheral neuropathy.  She has no motor findings.  Unless symptoms progress, no further evaluation or therapy is probably necessary.  I will plan to see this nice woman again in one year.  Other Orders: T-Comprehensive Metabolic Panel [phone]) T-CBC w/Diff [phone]) T-Lipid Profile ([id number])  Patient Instructions: 1)  Your physician recommends that you schedule a follow-up appointment in: 1 year 2)  Your physician recommends that you return for lab work in  :today

## 2011-01-22 NOTE — Cardiovascular Report (Signed)
Summary: Cardiac Cath Other  Cardiac Cath Other   Imported By: Faythe Ghee 02/14/2010 12:49:11  _____________________________________________________________________  External Attachment:    Type:   Image     Comment:   External Document

## 2011-01-22 NOTE — Assessment & Plan Note (Signed)
Summary: post hosp [facility]/tg   Visit Type:  Follow-up Primary Provider:  Dr.Angus Mcinnis   History of Present Illness: This is my first meeting with [name] [name], a very nice woman who has not been seen by our group for the past 4 years.  She underwent coronary artery bypass graft surgery in 1999 and subsequently has done very well.  She was admitted to hospital in January with chest discomfort.  Repeat coronary angiography revealed patent grafts and only modest progression of disease.  Medical therapy was advised.  She has felt well since discharge except for considerable emotional stress related to the recent death of her son and the current critical illness of her daughter, who is requiring mechanical ventilation in the Int[facility] Care Unit at [facility].  Patient is troubled by myalgias, which she attributes to simvastatin.  She also experiences pain in her calves, which she attributes to varicose veins.    Current Medications (verified): 1)  Aspir-Low 81 Mg Tbec (Aspirin) .... Take 1 Tab Daily 2)  Metformin Hcl 500 Mg Xr24h-Tab (Metformin Hcl) .... Take 1 Tab Two Times A Day 3)  Levothyroxine Sodium 25 Mcg Tabs (Levothyroxine Sodium) .... Take 1 Tab Daily 4)  Enalapril Maleate 10 Mg Tabs (Enalapril Maleate) .... Take 1 Tab Daily 5)  Simvastatin 20 Mg Tabs (Simvastatin) .... Take 1 Tablet By Mouth At Bedtime 6)  Nitrostat 0.4 Mg Subl (Nitroglycerin) .... Take As Needed For Chest Pain 7)  Lantus 100 Unit/ml Soln (Insulin Glargine) .... Take 55 U Am 55 U Pm 8)  Novolog Flexpen 100 Unit/ml Soln (Insulin Aspart) .... Sliding Scale 9)  Chlorthalidone 25 Mg Tabs (Chlorthalidone) .... Take 1/2 Tablet By Mouth Once Daily 10)  Metoprolol Tartrate 25 Mg Tabs (Metoprolol Tartrate) .... Take 1/2 Tablet By Mouth By Mouth Two Times A Day With Food 11)  Simvastatin 20 Mg Tabs (Simvastatin) .... Take 1 Tablet By Mouth At Bedtime  Allergies (verified): 1)  ! Codeine  Past History:  PMH, FH, and  Social History reviewed and updated.  Past Medical History: ASCVD: CABG surgery in 2001; nl stress nuclear-2007; angiography in 01/2006- TO of LAD; 70% ostial stenosis      of a small ramus; patent grafts; normal EF; cath in 12/2009 essentially unchanged Congestive heart failure with preserved LV systolic function HYPERLIPIDEMIA (ICD-272.4) HYPERTENSION (ICD-401.9) History of CVA AODM-requires insulin History of ataxia attributed to this tubular neuritis History of acute renal failure, possibly contrast induced Obesity Chronic obstructive pulmonary disease/chronic bronchitis HYPERTHYROIDISM (ICD-242.90) Obstructive sleep apnea Fall resulting in a right humeral fracture-03/2008 Chronic low back pain Vestibular neuritis    Past Surgical History: Appendectomy Lumbosacral spine surgery x2 CABG-2001 Cholecystectomy Arthroscopic knee surgery Rotator Cuff Repair Total abdominal hysterectomy Bilateral carpal tunnel release ORIF of right humeral shaft-2009  Cardiac Cath  Procedure date:  01/19/2010  Findings:       HEMODYNAMICS  aorta-136/65, LV-135/10, LVEDP-13  ANGIOGRAPHIC FINDINGS:   1. The left main coronary artery had no evidence of obstructive       disease.   2. The left anterior descending had 100% occlusion in the proximal       portion.  There were previously placed stents that were visualized radiographically beyond the area of total occlusion.  This vessel appeared unchanged from the prior catheterization.  The diagonal branch is known to fill from the saphenous vein graft.  The distal LAD is known to fill from the left internal mammary artery graft.   3. The circumflex artery  gave off a very early small ramus       intermediate branch that now has a 90% ostial stenosis.  The circumflex is comprised of  a large bifurcating OM with proximal and mid 30% stenoses.    4. The right coronary artery is a large dominant vessel-luminal irregularities and diffuse plaque  5.  Saphenous vein graft to the diagonal is patent. There is evidence of prior T stenting  of the diagonal and LAD.   6. LIMA to the mid LAD is patent,  however, the LAD is a small-caliber vessel with diffuse disease.   7. LV-nl systolic function.   Ejection fraction was 60-65%.      IMPRESSION:   1. Severe single-vessel coronary artery disease with diffuse disease  throughout the circumflex and right coronary artery.   2. Status post two-vessel coronary artery bypass grafting surgery with 2/2 patent bypass grafts.      RECOMMENDATIONS:    Add low-dose beta-blocker and statin to her medication regimen.           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  LUNGS:  Overall clear to auscultation bilaterally.   CARDIOVASCULAR:  Regular rate and rhythm; normal first and second heart sounds  NEUROLOGICAL:  No focal neurological deficits.   MUSCULOSKELETAL:  Muscle  strength is equal in all extremities.   ABDOMEN:  Bowel sounds present, obese, nontender, no rebound or  guarding.   EXTREMITIES:  No peripheral edema noted.  Pulses are 2+ in all extremities; moderate varicosities throughout both lower legs  PSYCHIATRIC:  Mood and affect are appropriate.       Impression & Recommendations:  Problem # 1:  ATHEROSCLEROTIC CARDIOVASCULAR DISEASE (ICD-429.2) She continues to do well following coronary artery bypass graft surgery with no recent symptoms to suggest myocardial ischemia.  Management will continue to concentrate on optimal control of risk factors.  Problem # 2:  HYPERTENSION (ICD-401.9) Control blood pressure suboptimal.  Metoprolol will be resumed and chlorthalidone added at a dose of 12.5 mg q.d.  A chemistry profile will be checked in 3 weeks.  She will returns to the cardiology nurses in one month for reassessment of blood pressure control.  Problem # 3:  HYPERLIPIDEMIA (ICD-272.4) She is experiencing a possible adverse effect of simvastatin.  We  will reduce the dose to 20 mg q.d. and reassess symptoms and control of hyperlipidemia.  Other Orders: Durable Medical Equipment (DME) Future Orders: T-Basic Metabolic Panel [id number]) ... 02/28/2010  Patient Instructions: 1)  Your physician recommends that you schedule a follow-up appointment in: 1 month for blood pressure check with nurse and 4 months with MD 2)  Your physician recommends that you return for lab work in: 3 weeks 3)  Your physician has recommended you make the following change in your medication: Resume Metoprolol and start taking Chlorthalodone 12.5(1-2 tablet) by mouth once dailyAlso, decrease Simvastatin to 20mg  by mouth at bedtime  4)  Your physician has requested that you regularly monitor and record your blood pressure readings at home.  Please use the same machine at the same time of day to check your readings and record them to bring to your follow-up  visit. Prescriptions: SIMVASTATIN 20 MG TABS (SIMVASTATIN) take 1 tablet by mouth at bedtime  #30 x 6   Entered by:   Larita Fife Via LPN   Authorized by:   Kathlen Brunswick, MD, [facility]   Signed by:   Larita Fife Via LPN on 09/81/1914   Method used:   Electronically to        [facility] [phone]* (retail)       [address]       [place], [place]  [address]       Ph: [phone] or [phone]       Fax: [phone]   RxID:   [id number] SIMVASTATIN 20 MG TABS (SIMVASTATIN) Take 1 tablet by mouth at bedtime  #30 x 0   Entered by:   Larita Fife Via LPN   Authorized by:   Kathlen Brunswick, MD, [facility]   Signed by:   Larita Fife Via LPN on 56/43/3295   Method used:   Electronically to        [facility] [phone]* (retail)       [address]       [place], [place]  [address]       Ph: [phone] or [phone]       Fax: [phone]   RxID:   [id number] METOPROLOL TARTRATE 25 MG TABS (METOPROLOL TARTRATE) take 1/2 tablet by mouth by mouth two times a day with food  #15 x 6   Entered by:   Larita Fife Via LPN   Authorized by:   Kathlen Brunswick, MD, [facility]   Signed by:   Larita Fife Via LPN on 26/94/8546   Method used:   Electronically to        [facility] [phone]* (retail)       [address]       [place], [place]  [address]       Ph: [phone] or [phone]       Fax: [phone]   RxID:   [id number] CHLORTHALIDONE 25 MG TABS (CHLORTHALIDONE) take 1/2 tablet by mouth once daily  #15 x 6   Entered by:   Larita Fife Via LPN   Authorized by:   Kathlen Brunswick, MD, [facility]   Signed by:   Larita Fife Via LPN on 00/86/7619   Method used:   Electronically to        [facility] [phone]* (retail)       [address]       [place]  [place], [place]  [address]       Ph: [phone] or [phone]       Fax: [phone]   RxID:   [id number]

## 2011-01-22 NOTE — Miscellaneous (Signed)
Summary: labs bmp,03/02/2010  Clinical Lists Changes  Observations: Added new observation of CALCIUM: 9.7 mg/dL (16/09/9603 54:09) Added new observation of CREATININE: 0.96 mg/dL (81/19/1478 29:56) Added new observation of BUN: 18 mg/dL (21/30/8657 84:69) Added new observation of BG RANDOM: 240 mg/dL (62/95/2841 32:44) Added new observation of CO2 PLSM/SER: 26 meq/L (03/02/2010 10:45) Added new observation of CL SERUM: 102 meq/L (03/02/2010 10:45) Added new observation of K SERUM: 4.5 meq/L (03/02/2010 10:45) Added new observation of NA: 141 meq/L (03/02/2010 10:45)

## 2011-01-22 NOTE — Letter (Signed)
Summary: [facility]  [address] [place] [address]   Phone: [phone]  Fax: [phone]      March 05, 2010 MRN: [id number]   [facility] [address] [place], [place]  [address]   Dear Ms. Schar,  Your test ordered by Selena Batten has been reviewed by your physician (or physician assistant) and was found to be normal or stable. Your physician (or physician assistant) felt no changes were needed at this time.  ____ Echocardiogram  ____ Cardiac Stress Test  __X__ Lab Work  ____ Peripheral vascular study of arms, legs or neck  ____ CT scan or X-ray  ____ Lung or Breathing test  ____ Other: Please continue on current medical treatment.   Thank you.   [place] Bing, MD, F.A.C.C

## 2011-01-28 ENCOUNTER — Ambulatory Visit (HOSPITAL_COMMUNITY): Admission: RE | Admit: 2011-01-28 | Payer: Medicare Other | Source: Ambulatory Visit

## 2011-02-07 ENCOUNTER — Ambulatory Visit (HOSPITAL_COMMUNITY)
Admission: RE | Admit: 2011-02-07 | Discharge: 2011-02-07 | Disposition: A | Discharge status: Home / Self Care | Payer: Medicare Other | Source: Ambulatory Visit | Attending: Family Medicine | Admitting: Family Medicine

## 2011-02-07 ENCOUNTER — Ambulatory Visit (HOSPITAL_COMMUNITY): Payer: Medicare Other

## 2011-02-07 DIAGNOSIS — Z139 Encounter for screening, unspecified: Secondary | ICD-10-CM

## 2011-02-07 DIAGNOSIS — Z1231 Encounter for screening mammogram for malignant neoplasm of breast: Secondary | ICD-10-CM | POA: Insufficient documentation

## 2011-02-11 IMAGING — CR DG CHEST 2V
2 series · 2 of 2 positions shown · non-contrast
Comparison: Chest radiograph 11/03/2008

CLINICAL DATA: Productive cough

CHEST - 2 VIEW

[view not recorded (1 of 2)]
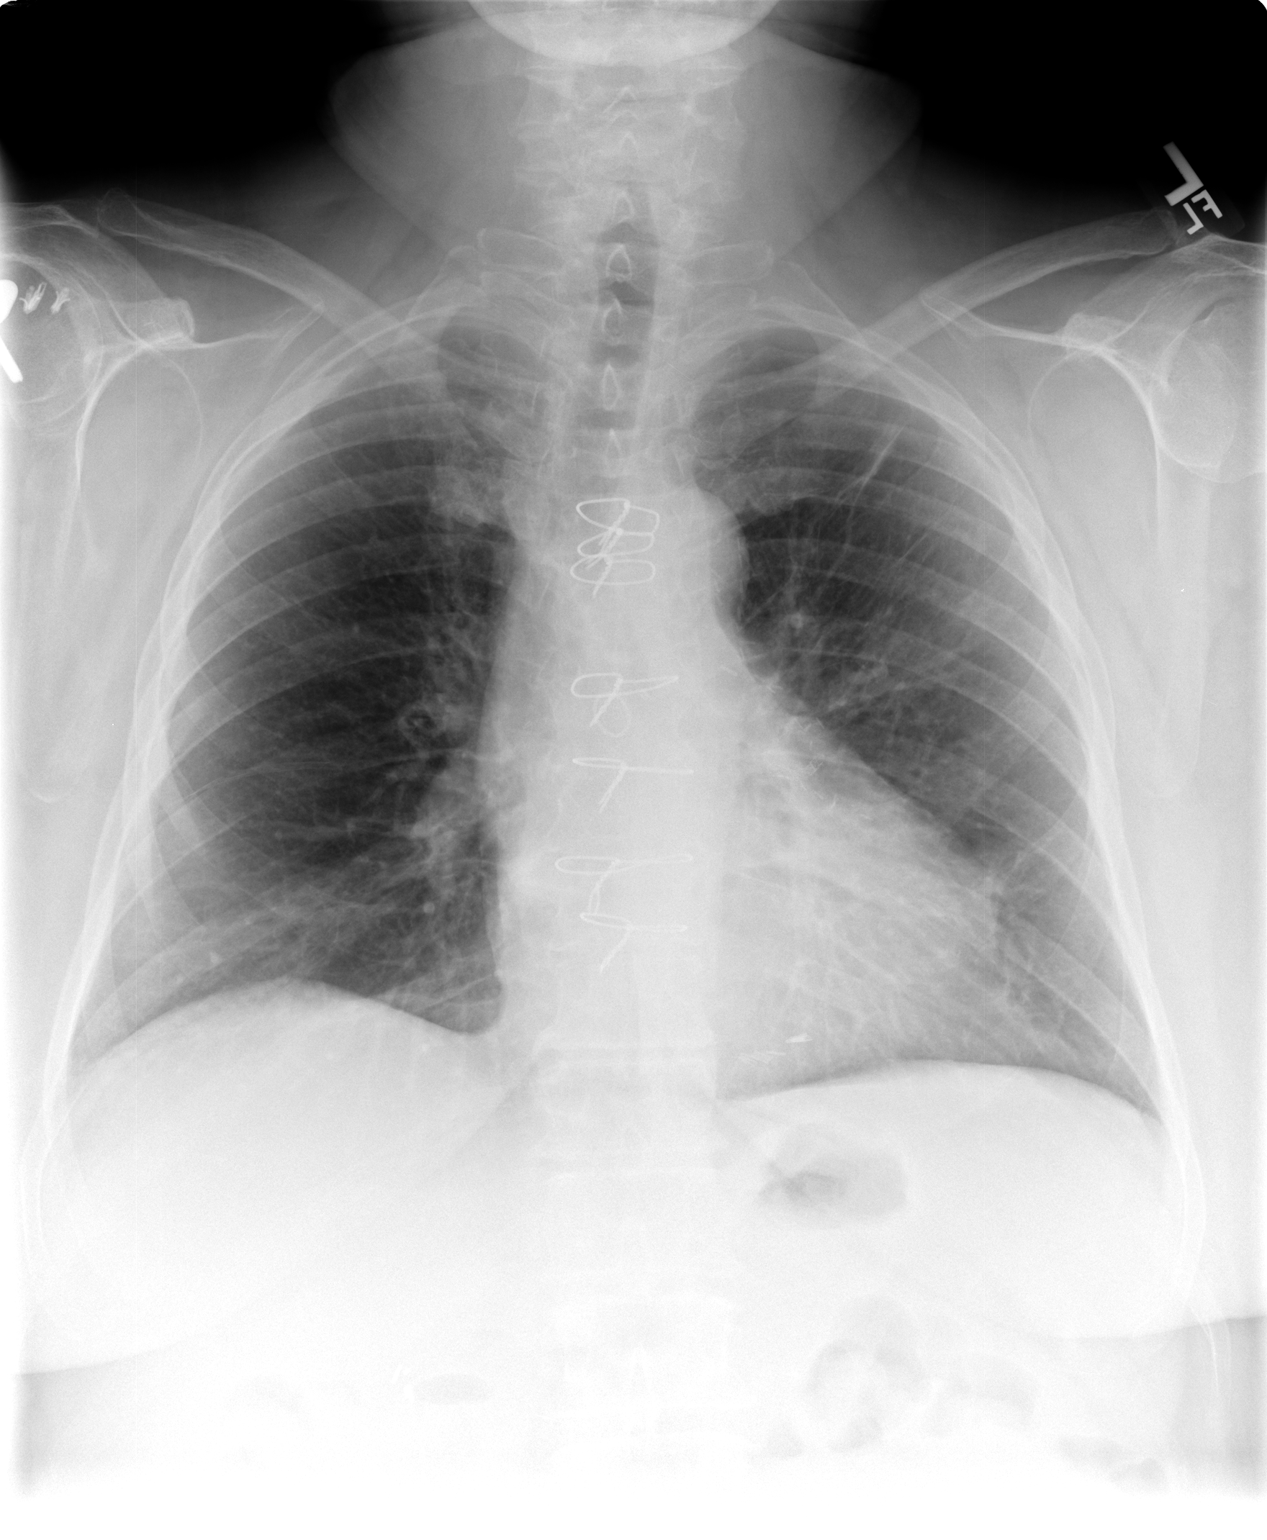

[view not recorded (2 of 2)]
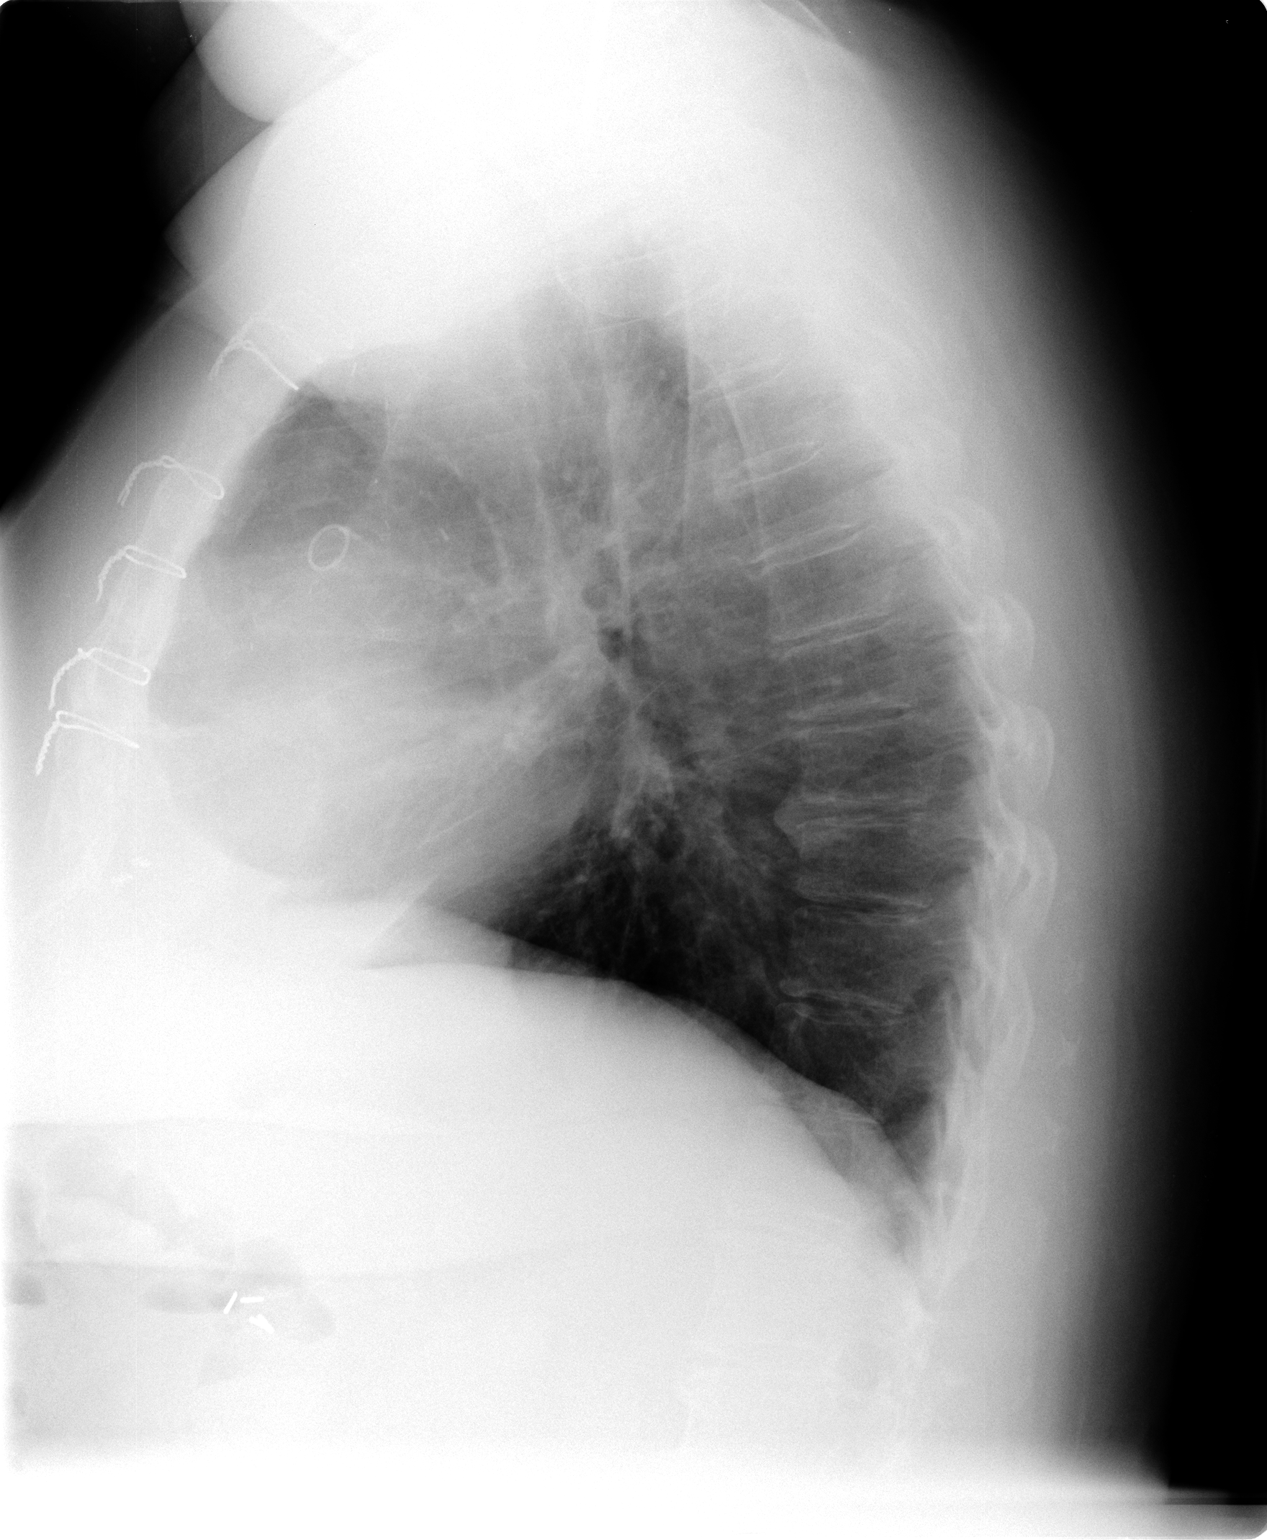

[2 of 2 positions shown; findings below may reference images not displayed]

FINDINGS: Cardiac enlargement with left ventricular hypertrophic
changes.  Coronary stents are identified.  Pulmonary interstitial
densities are chronic.  Nodular density seen on CT study 01/07/2008
is not identified.  Mid thoracic spine degenerative changes.
IMPRESSION: Cardiac enlargement redemonstrated.  No acute pulmonary process.

## 2011-03-07 LAB — URINALYSIS, ROUTINE W REFLEX MICROSCOPIC
Glucose, UA: >1000 mg/dL — AB
Nitrite: POSITIVE — AB
Specific Gravity, Urine: >1.03 — ABNORMAL HIGH (ref 1.005–1.030)
pH: 5.5 (ref 5.0–8.0)

## 2011-03-07 LAB — CBC
MCH: 30.5 pg (ref 26.0–34.0)
MCHC: 34.1 g/dL (ref 30.0–36.0)
Platelets: 201 10*3/uL (ref 150–400)
RDW: 12.9 % (ref 11.5–15.5)

## 2011-03-07 LAB — DIFFERENTIAL
Basophils Absolute: 0 10*3/uL (ref 0.0–0.1)
Eosinophils Relative: 2 % (ref 0–5)
Lymphocytes Relative: 20 % (ref 12–46)
Monocytes Absolute: 1.1 10*3/uL — ABNORMAL HIGH (ref 0.1–1.0)
Monocytes Relative: 8 % (ref 3–12)

## 2011-03-07 LAB — COMPREHENSIVE METABOLIC PANEL
AST: 21 U/L (ref 0–37)
Albumin: 3.7 g/dL (ref 3.5–5.2)
CO2: 28 mEq/L (ref 19–32)
Calcium: 9.5 mg/dL (ref 8.4–10.5)
Creatinine, Ser: 0.66 mg/dL (ref 0.4–1.2)
GFR calc Af Amer: >60 mL/min (ref >60)
GFR calc non Af Amer: >60 mL/min (ref >60)
Sodium: 137 mEq/L (ref 135–145)
Total Protein: 6.9 g/dL (ref 6.0–8.3)

## 2011-03-07 LAB — URINE MICROSCOPIC-ADD ON

## 2011-03-10 LAB — DIFFERENTIAL
Basophils Absolute: 0 10*3/uL (ref 0.0–0.1)
Basophils Relative: 1 % (ref 0–1)
Eosinophils Absolute: 0.4 10*3/uL (ref 0.0–0.7)
Eosinophils Relative: 5 % (ref 0–5)
Lymphocytes Relative: 29 % (ref 12–46)
Monocytes Absolute: 0.7 10*3/uL (ref 0.1–1.0)

## 2011-03-10 LAB — BASIC METABOLIC PANEL
BUN: 8 mg/dL (ref 6–23)
CO2: 29 mEq/L (ref 19–32)
GFR calc non Af Amer: >60 mL/min (ref >60)
Glucose, Bld: 267 mg/dL — ABNORMAL HIGH (ref 70–99)
Potassium: 4.1 mEq/L (ref 3.5–5.1)

## 2011-03-10 LAB — POCT CARDIAC MARKERS
CKMB, poc: 1.1 ng/mL (ref 1.0–8.0)
CKMB, poc: <1 ng/mL — ABNORMAL LOW (ref 1.0–8.0)
CKMB, poc: <1 ng/mL — ABNORMAL LOW (ref 1.0–8.0)
Troponin i, poc: <0.05 ng/mL (ref 0.00–0.09)
Troponin i, poc: <0.05 ng/mL (ref 0.00–0.09)

## 2011-03-10 LAB — CBC
HCT: 46.6 % — ABNORMAL HIGH (ref 36.0–46.0)
Hemoglobin: 15.5 g/dL — ABNORMAL HIGH (ref 12.0–15.0)
MCHC: 33.3 g/dL (ref 30.0–36.0)
MCV: 90.7 fL (ref 78.0–100.0)
Platelets: 215 10*3/uL (ref 150–400)
RDW: 13 % (ref 11.5–15.5)

## 2011-03-10 LAB — D-DIMER, QUANTITATIVE: D-Dimer, Quant: 0.34 ug/mL-FEU (ref 0.00–0.48)

## 2011-03-11 LAB — CARDIAC PANEL(CRET KIN+CKTOT+MB+TROPI)
CK, MB: 1.1 ng/mL (ref 0.3–4.0)
CK, MB: 1.6 ng/mL (ref 0.3–4.0)
Relative Index: INVALID (ref 0.0–2.5)
Relative Index: INVALID (ref 0.0–2.5)
Total CK: 54 U/L (ref 7–177)
Total CK: 67 U/L (ref 7–177)
Troponin I: 0.02 ng/mL (ref 0.00–0.06)
Troponin I: 0.02 ng/mL (ref 0.00–0.06)
Troponin I: 0.04 ng/mL (ref 0.00–0.06)

## 2011-03-11 LAB — BRAIN NATRIURETIC PEPTIDE: Pro B Natriuretic peptide (BNP): 70 pg/mL (ref 0.0–100.0)

## 2011-03-11 LAB — BASIC METABOLIC PANEL
BUN: 9 mg/dL (ref 6–23)
CO2: 28 mEq/L (ref 19–32)
Chloride: 101 mEq/L (ref 96–112)
Creatinine, Ser: 0.75 mg/dL (ref 0.4–1.2)
GFR calc Af Amer: >60 mL/min (ref >60)
Potassium: 4 mEq/L (ref 3.5–5.1)

## 2011-03-11 LAB — CBC
MCV: 91.5 fL (ref 78.0–100.0)
Platelets: 216 10*3/uL (ref 150–400)
RBC: 4.42 MIL/uL (ref 3.87–5.11)
WBC: 11.2 10*3/uL — ABNORMAL HIGH (ref 4.0–10.5)

## 2011-03-11 LAB — TSH: TSH: 2.053 u[IU]/mL (ref 0.350–4.500)

## 2011-03-11 LAB — GLUCOSE, CAPILLARY

## 2011-03-12 ENCOUNTER — Other Ambulatory Visit (HOSPITAL_COMMUNITY): Payer: Self-pay | Admitting: Family Medicine

## 2011-03-12 DIAGNOSIS — E049 Nontoxic goiter, unspecified: Secondary | ICD-10-CM

## 2011-03-12 DIAGNOSIS — R0989 Other specified symptoms and signs involving the circulatory and respiratory systems: Secondary | ICD-10-CM

## 2011-03-13 ENCOUNTER — Ambulatory Visit (HOSPITAL_COMMUNITY)
Admission: RE | Admit: 2011-03-13 | Discharge: 2011-03-13 | Disposition: A | Discharge status: Home / Self Care | Payer: Medicare Other | Source: Ambulatory Visit | Attending: Family Medicine | Admitting: Family Medicine

## 2011-03-13 DIAGNOSIS — M949 Disorder of cartilage, unspecified: Secondary | ICD-10-CM | POA: Insufficient documentation

## 2011-03-13 DIAGNOSIS — M899 Disorder of bone, unspecified: Secondary | ICD-10-CM | POA: Insufficient documentation

## 2011-03-14 ENCOUNTER — Ambulatory Visit (HOSPITAL_COMMUNITY)
Admission: RE | Admit: 2011-03-14 | Discharge: 2011-03-14 | Disposition: A | Discharge status: Home / Self Care | Payer: Medicare Other | Source: Ambulatory Visit | Attending: Family Medicine | Admitting: Family Medicine

## 2011-03-14 ENCOUNTER — Other Ambulatory Visit (HOSPITAL_COMMUNITY): Payer: Self-pay | Admitting: Family Medicine

## 2011-03-14 DIAGNOSIS — E049 Nontoxic goiter, unspecified: Secondary | ICD-10-CM | POA: Insufficient documentation

## 2011-03-14 DIAGNOSIS — R0989 Other specified symptoms and signs involving the circulatory and respiratory systems: Secondary | ICD-10-CM

## 2011-03-14 DIAGNOSIS — R6889 Other general symptoms and signs: Secondary | ICD-10-CM | POA: Insufficient documentation

## 2011-03-14 DIAGNOSIS — J984 Other disorders of lung: Secondary | ICD-10-CM
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
## 2011-04-03 LAB — DIFFERENTIAL
Basophils Absolute: 0 10*3/uL (ref 0.0–0.1)
Eosinophils Relative: 6 % — ABNORMAL HIGH (ref 0–5)
Lymphocytes Relative: 28 % (ref 12–46)
Neutrophils Relative %: 58 % (ref 43–77)

## 2011-04-03 LAB — URINALYSIS, ROUTINE W REFLEX MICROSCOPIC
Bilirubin Urine: NEGATIVE
Ketones, ur: NEGATIVE mg/dL
Nitrite: NEGATIVE
Protein, ur: NEGATIVE mg/dL
Urobilinogen, UA: 0.2 mg/dL (ref 0.0–1.0)

## 2011-04-03 LAB — CBC
HCT: 45 % (ref 36.0–46.0)
Platelets: 211 10*3/uL (ref 150–400)
RDW: 12.8 % (ref 11.5–15.5)
WBC: 9.7 10*3/uL (ref 4.0–10.5)

## 2011-04-03 LAB — BASIC METABOLIC PANEL
BUN: 9 mg/dL (ref 6–23)
Calcium: 9.7 mg/dL (ref 8.4–10.5)
Creatinine, Ser: 0.57 mg/dL (ref 0.4–1.2)
GFR calc non Af Amer: >60 mL/min (ref >60)
Glucose, Bld: 189 mg/dL — ABNORMAL HIGH (ref 70–99)

## 2011-05-07 NOTE — Group Therapy Note (Signed)
NAME:  [name] [name], [name] [name]              ACCOUNT NO.:  [id number]   MEDICAL RECORD NO.:  [id number]          PATIENT TYPE:  INP   LOCATION:  A214                          FACILITY:  [facility]   PHYSICIAN:  Angus G. Renard Matter, MD   DATE OF BIRTH:  1936/01/13   DATE OF PROCEDURE:  DATE OF DISCHARGE:                                 PROGRESS NOTE   This patient admitted with dizziness and ataxic gait.  She does have a  past history of coronary artery and heart disease, hypertension.  She  was unable to walk without falling at home.  An MRI done showed no acute  infarct.  No carotid stenosis.  Chemistries were normal with the  exception of elevated blood sugar.   OBJECTIVE:  VITAL SIGNS:  Blood pressure 131/77, respirations 24, pulse  63.  HEENT:  Eyes PERRLA.  TMs negative.  Oropharynx benign.  NECK:  Supple.  No JVD.  HEART:  Regular rhythm.  No murmurs.  LUNGS:  Clear to P&A.  ABDOMEN:  No palpable organs or masses.   ASSESSMENT:  The patient was admitted with a ataxic gait and dizziness .  She does have a past history of coronary artery disease, poorly  controlled diabetes.   PLAN:  To obtain neurology consult.  Continue to monitor blood sugars.  Continue current regimen.      Angus G. Renard Matter, MD  Electronically Signed     AGM/MEDQ  D:  09/17/2007  T:  09/17/2007  Job:  [id number]
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
## 2011-05-07 NOTE — Consult Note (Signed)
NAME:  Naeem, Beau              ACCOUNT NO.:  [id number]   MEDICAL RECORD NO.:  [id number]          PATIENT TYPE:  INP   LOCATION:  A214                          FACILITY:  [facility]   PHYSICIAN:  Kofi A. Gerilyn Pilgrim, M.D. DATE OF BIRTH:  11/30/1936   DATE OF CONSULTATION:  09/19/2007  DATE OF DISCHARGE:                                 CONSULTATION   REASON FOR CONSULTATION:  Gait problems.   [name] who has not been feeling well for the  last week or so.  She described a sensation of being pulled to the left  side as if she were going to fall, particularly on standing.  The  patient came to the hospital initially about 5 days ago and had an  evaluation. Her evaluation included a head CT scan which was  unrevealing.  She was sent back home but presented with a similar  complaint.  Again she had dizziness described as dysequilibrium and as  if she were going to fall, especially towards the left.  She also has  had a significant headache, which is unusual for the patient.  She  reports her entire head bothers her.  She has had significant nausea and  vomiting with her symptoms.  She does not report focal neurological  signs.  She denies numbness, weakness, dysarthria, or dysphagia.  The  patient reports that she had a stroke in the past, but she is not quite  sure which side, believes it may be on the left side.  This was about 12  years ago.  She had dysarthria and facial weakness with those symptoms  then.   PAST MEDICAL HISTORY:  1. Stroke previously.  2. Hypertension.  3. Diabetes.  4. Bronchitis.  5. Coronary artery disease.  6. Sleep apnea, although she has had problems using her machine.   PAST SURGICAL HISTORY:  1. Appendectomy.  2. Cholecystectomy.  3. Hysterectomy.  4. Rotator cuff repair.  5. Coronary artery bypass grafting.  6. Lumbar laminectomy.  7. Knee surgery.  8. Carpal tunnel release surgery bilaterally.   SOCIAL HISTORY:  Consumes  alcohol minimally.  No tobacco use.   ALLERGIES:  CODEINE, FLEXERIL, and ACE INHIBITORS.   FAMILY HISTORY:  Significant for coronary artery disease.  Apparently  her father died for complications of this.   ADMISSION MEDICATIONS:  Cozaar, Synthroid, Lipitor, insulin, aspirin,  albuterol inhalers, albuterol nebulizer and Atrovent nebulizer, Xanax,  metformin,   REVIEW OF SYSTEMS:  As in History of Present Illness.  She reports she  had a significantly elevated blood pressure at the onset of her  symptoms, apparently in the 170s/100 to 110, although in reviewing her  hospital stay, she has been normotensive throughout.   PHYSICAL EXAMINATION:  GENERAL:  An obese, pleasant lady in no acute  distress.  VITAL SIGNS:  Temperature 97.2, pulse 76, respirations 20, blood  pressure 144/69.  HEENT AND NECK:  Neck is supple.  Head is normocephalic and atraumatic.  She has a large tongue base and significant crowing of posterior air  space.  ABDOMEN:  Obese but soft.  EXTREMITIES:  No significant edema or varicosities.  NEUROLOGIC:  Mentation:  She is awake and alert.  She converses well.  Speech, language, and cognition are intact.  On cranial nerve  evaluation, she has full extraocular movements, but she has significant  gaze evoked nystagmus on horizontal movements bilaterally.  Pupils  equal, round, and reactive to light.  She does have some crowding of  disk margins, and I seen no clear evidence of spontaneous venous  pulsations.  Visual fields are full.  Facial muscle strength is  symmetric.  Tongue midline, uvula midline.  Shoulder shrug is normal.  Motor examination shows mild triceps weakness bilaterally.  Otherwise  upper extremities show normal tone, bulk, and strength.  She did have  some mild dorsiflexion weakness on the right side.  Otherwise the lower  extremities show normal tone,  bulk, and strength.  Reflexes are  preserved.  Plantar reflexes are both equivocal.  Sensation  is symmetric  to temperature and light touch bilaterally.  Coordination:  I see no  dysmetria, tremors, or parkinsonism.  Gait:  She required assistance in  standing from a lying position.  Stance was wide.  Gait was unsteady and  essentially wide and antalgic.  Otherwise nonspecific.   MRI of the brain shows advanced small vessel disease.  No acute process  is noted.   MRA of the brain and extracranial carotids essentially are unrevealing.   ASSESSMENT:  1. Acute onset ataxia with vertigo, nausea, vomiting in a lady who has      negative imaging.  I believe this patient most likely has acute      vestibular neuritis.  I am somewhat concerned about her headaches      given her obesity.  She may be at increased risk of cerebral venous      thrombosis.  2. Obstructive sleep apnea syndrome, untreated.  3. Hypertension.  4. Diabetes.   RECOMMENDATIONS:  1. I think we should consider treating her symptomatically.  2. Physical therapy and occupational therapy should be obtained.  3. I am going to recommend a magnetic resonance venogram to evaluate      for possibility of cerebral venous      thrombosis.  4. If she is not on deep vein thrombosis prophylaxis, this should be      done.   Thanks for this consultation.      Kofi A. Gerilyn Pilgrim, M.D.  Electronically Signed     KAD/MEDQ  D:  09/20/2007  T:  09/20/2007  Job:  [id number]

## 2011-05-07 NOTE — Consult Note (Signed)
NAME:  [name] [name], [name] [name]              ACCOUNT NO.:  [id number]   MEDICAL RECORD NO.:  [id number]          PATIENT TYPE:  INP   LOCATION:  5003                         FACILITY:  [facility]   PHYSICIAN:  Hillery Aldo, M.D.   DATE OF BIRTH:  [birth year]   DATE OF CONSULTATION:  04/19/2008  DATE OF DISCHARGE:                                 CONSULTATION   PRIMARY CARE PHYSICIAN:  Angus G. McInnis, MD   REASON FOR CONSULTATION:  Management of bronchitis, diabetes,  hypertension, chronic obstructive pulmonary disease, hypothyroidism, and  other chronic medical problems.   HISTORY OF PRESENT ILLNESS:  The patient is a 75 year old female who was  admitted after sustaining a fall resulting in a right proximal humeral  fracture and is now status post open reduction and internal fixation by  Dr. Carola Frost.  The patient states that she thinks she lost her footing.  She denies any presyncope or syncope.  She denies any loss of  consciousness.  She denies any associated chest pain, shortness of  breath, or other symptoms.  She has not recently suffered with any fever  or chills.  We are asked to help manage her chronic medical problems.   PAST MEDICAL HISTORY:  1. Hypertension.  2. Diabetes.  3. Obstructive sleep apnea.  4. Hypothyroidism.  5. Coronary artery disease status post CABG in 2001 with an ejection      fraction of 70%, two-vessel coronary disease on followup      catheterization, medical management advised.  6. History of possible CVA and TIA.  7. Chronic obstructive pulmonary disease/chronic bronchitis.  8. Morbid obesity.  9. Chronic back pain.  10.History of ataxia thought to be secondary to vestibular neuritis.  11.History of acute renal failure, possibly contrast induced.  12.Dyslipidemia.   PAST SURGICAL HISTORY:  1. Appendectomy.  2. Coronary artery bypass grafting in 2001.  3. Cholecystectomy.  4. Hysterectomy.  5. Rotator cuff surgery.  6. Back surgery.  7. Knee surgery.  8. Bilateral carpal tunnel release.  9. Open reduction and internal fixation, right humeral shaft fracture.   ALLERGIES:  CODEINE, FLEXERIL, ACE INHIBITORS.   MEDICATIONS:  1. Cozaar 100 mg daily.  2. Synthroid 25 mcg daily.  3. Lipitor 20 mg daily.  4. Lantus 55 units b.i.d. subcutaneously.  5. Aspirin 81 mg daily.  6. Albuterol 2 puffs q.4 h. p.r.n.  7. Metformin 500 mg b.i.d.  8. Vicodin p.r.n.  9. Apidra sliding scale insulin.   FAMILY HISTORY:  The patient's father died in his 60s from acute MI.  The patient's mother died in her 70s from a brain aneurysm.  She also  was hypertensive.  The patient has 2 living siblings.  Her brother has  had coronary artery disease and myocardial infarction as well as colon  cancer.  The patient's sister has multiple chronic medical problems but  cannot specify what these are.  She has one sibling that is deceased  secondary to MVA.  She has 6 offspring, two who suffer with a  hypercoagulable blood disorder.   SOCIAL HISTORY:  The patient is married.  She is  a lifelong nonsmoker.  She denies alcohol use.  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   REVIEW OF SYSTEMS:  The patient endorses vaginal irritation and pruritus  status post recent course of antibiotic therapy for treatment of  bronchitis.  A comprehensive 14-point review of systems is otherwise  negative.   PHYSICAL EXAMINATION:  VITAL SIGNS:  Temperature 99, blood pressure  103/59, pulse 69, respirations 20, O2 saturation 95% on 2 L.  GENERAL:  Obese female who is in no acute distress.  HEENT:  Normocephalic, atraumatic.  PERRL.  EOMI.  Oropharynx is clear.  NECK:  Supple, no thyromegaly, no lymphadenopathy.  CHEST:  Coarse but clear bilaterally.  HEART:  Regular rate, rhythm.  No murmurs, rubs, or gallops.  ABDOMEN:  Soft, nontender, nondistended with normoactive bowel sounds.  EXTREMITIES:  No clubbing, edema, or cyanosis.  SKIN:  Warm and dry.  No rashes.   NEUROLOGIC:  The patient is alert and oriented x3.  Cranial nerves II-  XII grossly intact.  Moves all extremities with equal strength with the  exception of her right upper extremity, which is currently in a sling.   DATA REVIEW:  Sodium is 140, potassium 3.8, chloride 105, bicarb 30, BUN  9, creatinine 0.72, glucose 170.  White blood cell count is 12.7,  hemoglobin 13, hematocrit 36.9, platelets 215.   ASSESSMENT AND PLAN:  1. Hypothyroidism:  Continue the patient's usual dose of Synthroid and      check a TSH value in the morning to assure that she is      appropriately replaced.  In the meantime, would continue her home      dose of Synthroid.  2. Diabetes:  Continue the patient's Lantus and sliding scale insulin      at this time.  She has one CBG check that shows a glucose of 158      and her morning lab work shows a glucose of 170.  We will monitor      the trends and adjust her insulin as needed.  Check hemoglobin A1c      to determine her overall glycemic control.  3. Hypertension:  The patient's blood pressures are on the soft side      and at this time, we would hold her Cozaar to protect her kidney      function.  4. History of recent bronchitis flare:  The patient's respiratory      status is stable.  She is denying any dyspnea.  Continue to monitor      her respiratory status closely.  5. Vaginal candidiasis:  Administer Diflucan.  6. History of coronary artery disease:  Resume the patient's      antiplatelet therapy.  7. Leukocytosis:  This may be a stress response to her recent fall and      injury.  We will check a urinalysis and culture and a chest x-ray      to rule out occult infection.   Thank you for this consultation.  We will follow the patient with you.      Hillery Aldo, M.D.  Electronically Signed     CR/MEDQ  D:  04/19/2008  T:  04/19/2008  Job:  [id number]   cc:   Angus G. Renard Matter, MD

## 2011-05-07 NOTE — H&P (Signed)
NAME:  [name] [name], [name] [name]              ACCOUNT NO.:  [id number]   MEDICAL RECORD NO.:  [id number]          PATIENT TYPE:  INP   LOCATION:  A214                          FACILITY:  [facility]   PHYSICIAN:  Edward L. Juanetta Gosling, M.D.DATE OF BIRTH:  11/25/1936   DATE OF ADMISSION:  09/16/2007  DATE OF DISCHARGE:  LH                              HISTORY & PHYSICAL   REASON FOR ADMISSION:  Stroke.   HISTORY:  [name] is a 75 year old who actually was in the  emergency room yesterday with symptoms of some numbness, but generally  did fairly well and was able to be discharged.  She had CT than that did  not show any definite stroke, and she was discharged and told to follow-  up with her regular physician who is Dr. Renard Matter.  She had hoped to wake  up feeling better this morning, but instead, she woke up with increasing  problems and came to the emergency room again.  She is now not able to  walk without assistance.  She is very dizzy.  She has developed a  nausea.  She vomited here in the emergency room.  She has had several  episodes of being very sick.  She feels like she is very weak now and,  as mentioned, she is unable to ambulate.  She has an MRI, MRA of the  head done on this episode in the emergency room that showed no acute  infarct, bilateral vertebral stenosis and distal right vertebral  stenosis, and a lot of intracranial atherosclerosis, but no large vessel  occlusions.   PAST MEDICAL HISTORY:  1. Coronary disease.  2. Hypertension.  3. Diabetes.  4. Bronchitis.  5. Previous stroke some years ago that left her with significant left-      sided weakness, from which she has largely recovered.  6. Sleep apnea.   PAST SURGICAL HISTORY:  1. Appendectomy.  2. Cholecystectomy.  3. Hysterectomy.  4. Rotator cuff repair.  5. Coronary artery bypass grafting.  6. Back surgery.  7. Knee surgery.  8. Carpal tunnel surgery bilaterally.  9. History of COPD.   SOCIAL HISTORY:  She does  not smoke cigarettes.  She is listed as having  a minimal alcohol consumption.   ALLERGIES:  CODEINE, FLEXERIL, ACE INHIBITORS.   FAMILY HISTORY:  Positive for having had myocardial infarction in her  father from which he is deceased.  Although she is a nonsmoker, she does  have some history of secondhand smoke exposure.   MEDICATIONS:  1. Cozaar 100 mg daily.  2. Synthroid 25 mcg daily.  3. Lipitor 20 mg daily.  4. Lantus 55 units in the morning, 50 units in the evening.  5. Aspirin 81 mg daily.  6. Albuterol inhaler as needed.  7. Albuterol and Atrovent nebulizer as needed.  8. Xanax 0.5 as needed.  9. Metformin 500 mg twice a day.  10.Apidra on a sliding scale.   REVIEW OF SYSTEMS:  Otherwise than as mentioned is pretty much negative.  She is nauseated.   PHYSICAL EXAMINATION:  VITAL SIGNS:  Blood pressure 134/81, pulse 62,  respirations 18, temperature is 97.6.  GENERAL:  She looks uncomfortable.  HEENT:  Her nose and throat are clear.  NECK:  Supple.  CHEST:  Clear without wheezes, rales or rhonchi.  HEART:  Regular.  ABDOMEN:  Soft.  No masses felt.  EXTREMITIES:  Showed no edema.  CENTRAL NERVOUS SYSTEM: Examination shows she has very poor finger-to-  nose testing.  She is clearly weak on the left side.  Her speech is  mildly slurred, but no definitive change in her speech.  Her tongue does  protrude midline.  Her gait had been tested by Dr. Zachery Conch, but I did  not retest it because she was vomiting, but she was listed as being a  ataxic.   STUDIES:  Her MRI of the brain is as mentioned.  Electrolytes are with a  blood sugar of 204, BUN 9, creatinine 0.79.  She had a CBC yesterday  that showed a white count 9000, hemoglobin 14.2 platelets 241.   ASSESSMENT:  Clearly, she is having some sort of a neurological event  whether it be a full blown stroke, a TIA, etc.  My plan then is to go  ahead and admit her to Dr. Renard Matter.  She is going to have neuro checks.  I  will go ahead and give her 80 mg of Lipitor.  Continue with her  antihypertensive.  Will have her do sliding scale insulin, clear liquid  diet for now because she is nauseated.  Aggrenox, see if that makes any  difference, and then she will be reevaluated by Dr. Renard Matter in the  morning.      Edward L. Juanetta Gosling, M.D.  Electronically Signed     ELH/MEDQ  D:  09/16/2007  T:  09/17/2007  Job:  [id number]

## 2011-05-07 NOTE — Op Note (Signed)
NAME:  [name] [name], [name] [name]              ACCOUNT NO.:  [id number]   MEDICAL RECORD NO.:  [id number]          PATIENT TYPE:  OIB   LOCATION:  5037                         FACILITY:  [facility]   PHYSICIAN:  Dionne Ano. Gramig, M.D.DATE OF BIRTH:  [birth year]   DATE OF PROCEDURE:  DATE OF DISCHARGE:                               OPERATIVE REPORT   PREOPERATIVE DIAGNOSIS:  Radial nerve deficit right upper extremity with  chronic wrist drop, loss of finger extension, and poor forearm and upper  extremity function secondary to the deficit.   POSTOPERATIVE DIAGNOSIS:  Radial nerve deficit right upper extremity  with chronic wrist drop, loss of finger extension, and poor forearm and  upper extremity function secondary to the deficit.   PROCEDURES:  1. Flexor carpi radialis tendon transfer to the extensor digitorum      communis to the index finger.  2. Flexor carpi radialis tendon transfer to the extensor digitorum      communis to the middle finger.  3. Flexor carpi radialis tendon transfer to the extensor digitorum      communis to the ring finger.  4. Flexor carpi radialis tendon transfer to the extensor digitorum      communis to the small finger.  5. Flexor carpi radialis tendon transfer to the extensor digiti      minimi.  6. Extensor pollicis longus decompression and transposition to the      soft tissue (tendon sheath release and transfer).  7. Palmaris longus tendon transfer to the extensor pollicis longus.  8. Pronator teres tendon transfer to the extensor carpi radialis      brevis.   SURGEON:  Dionne Ano. Amanda Pea, MD   ASSISTANT:  Karie Chimera, P.A.-C   COMPLICATIONS:  None.   ESTIMATED BLOOD LOSS:  Minimal.   TOURNIQUET TIME:  Less than 90 minutes.   INDICATIONS FOR PROCEDURE:  This patient is a 75 year old female who  presents with the above-mentioned diagnosis.  I have counseled her in  regards to risks and benefits of surgery including risk of infection,  bleeding,  anesthesia, damage to normal structures and failure of the  surgery to accomplish its intended goals of relieving symptoms and  restoring function.  With this in mind, she desires to proceed.  All  questions have been encouraged and answered preoperatively.  She  understands that this may or may not relieve all of her symptoms.  She  does have some numbness throughout the arm.  I have discussed with her  this is not necessarily intended to cure all of her dysesthesias but  hopefully will give her function in terms of the radial nerve deficit  loss.  I have discussed with her the meaningful and realistic goals,  do's and dont's etc.   OPERATION IN DETAIL:  The patient was seen by myself, anesthesia, taken  to the operative suite, and underwent a smooth induction of general  anesthesia under the direction of Dr. Sheldon Silvan.  The patient  tolerated this reasonably well.  She was well-padded and body parts  checked.  Preoperative antibiotics were given in the form of 2 g  of  Ancef and she was also given 400 of Cipro I.V. due to a urinalysis which  was questionable.  I should note that the patient did not have  significant UTI symptoms but did have a urinalysis which was somewhat  abnormal.  Following securing sterile field with Betadine scrub and  paint, a tourniquet was applied and insufflated.  Following this, I made  a transverse incision at the wrist, dissected down and harvested the  palmaris longus.  I placed a tagging stitch on it and then transferred  this in the subcutaneous tissue over to the snuffbox region.  Following  this, I then performed harvesting of the flexor carpi radialis through  the small transverse incision, it was severed and allowed to retract  proximally.  Once this was done, I then performed a mid forearm  incision.  Dissection was carried down.  Fasciotomy was accomplished and  the FCR was retrieved.  I then identified the pronator teres and took  this as well  as as much periosteal sleeve as possible from its insertion  site.  This was freed up.  FiberWire was placed on the ends of both the  pronator teres and the FCR and following this, the FCR was placed  through a transosseous window which was created.  I made sure I did not  encroach upon the anterior interosseous nerve or vessels and was quite  pleased with the transfer to the dorsal tissue.  I made a counter  incision dorsally over the EDC and EPL apparatus and retrieved the  tendon.  Following this, I then subcutaneously dissected and identified  the ECRB tendon which underwent tendon transfer to the pronator teres.  The pronator teres was placed with Pulvertaft weave through the ECRB  tendon and sutured with 3-0 FiberWire with the wrist in maximum  extension.  This completed the pronator teres ECRB tendon transfer.   Next, I re-routed the EPL tendon by incising the tendon sheath.  Once  the tendon sheath was incised and decompressed, I then transferred it to  the dorsal subcutaneous tissue and this was accomplished without  difficulty.   Following this, I then identified the palmaris longus and performed a  tendon transfer with Pulvertaft weave at the snuffbox.  Maximum tension  was placed against the thumb and palmaris to achieve this goal of tendon  transfer.  The transfer looked excellent and the thumb was quite nicely  tensioned.  Following this, I performed a flexor carpi radialis tendon  transfer to the [facility], to the index finger, middle finger, ring finger,  and small finger.  This was done with Pulvertaft weave fashion.  All  four separate tendons were separately tendon transferred with the  Pulvertaft weave type stitch.  Following this, the EDM (extensor digiti  minimi) was transferred to the FCR in a similar Pulvertaft weave  fashion.  Thus five tendon transfers utilizing the FCR was accomplished.  The patient had the wrist in extension, MCPs at 0 to 5 degrees of  flexion, and  proper tension created.  With the wrist in extension the  patient could achieve nice flexion of the fingers passively and with the  wrist in flexion, the patient will have full extension but no  hyperextension about the MCPs to any advanced degree.  I was pleased  with the findings.  The tendon transfers were secured nicely with 3-0  FiberWire and the wounds were closed with combination of Vicryl followed  by Prolene.  Once this was  done, she was placed in a fiberglass sugar-  tong with thumb spica extension.   I should note that the patient tolerated the procedure very well.  There  were no complicating features, soft compartments and she was stable in  the recovery room.  Postoperatively, we are going to plan for removal of  the sutures in 10-14 days at which time she will go into a long-arm cast  in slight pronation with the wrist in full extension and the MCP joints  in full extension will leave the PIP and DIPs free to begin motion.  At  4 weeks postop, we will have her go down to therapy, make a short-arm  splint for the wrist and MCPs to be full extended as well as the thumb  and begin interval range of motion.  On week 7 postop, we will  discontinue the splint and simply go to a nighttime splinting regime,  and hopefully in about 3 months we will have reactivation of the  transfers and excellent function.   I should note that the patient did quite well with her surgery.  She was  splinted in 30 degrees pronation with sugar-tong on.  The wrist was at  45 degrees of extension.  Maximum tension was placed about the pronator  ECRB and palmaris longus EPL tendon transfer, as well as proper tension  verified on the FCR to [facility] and EDM transfers.   It was a pleasure to see her and treat her.  We look forward to  participate in her postop care.      Dionne Ano. Amanda Pea, M.D.  Electronically Signed     WMG/MEDQ  D:  09/20/2008  T:  09/21/2008  Job:  [id number]

## 2011-05-07 NOTE — Group Therapy Note (Signed)
NAME:  SIGOURNEY, PORTILLO              ACCOUNT NO.:  [id number]   MEDICAL RECORD NO.:  [id number]          [name] TYPE:  INP   LOCATION:  A214                          FACILITY:  [facility]   PHYSICIAN:  Catalina Pizza, M.D.        DATE OF BIRTH:  04-Sep-1936   DATE OF PROCEDURE:  09/21/2007  DATE OF DISCHARGE:                                 PROGRESS NOTE   SUBJECTIVE:  [name] [name] is a 75 year old obese female with initial  dizziness, nausea, vomiting, seen and assessed by Dr. Gerilyn Pilgrim and  question of whether [name] [name] had a vestibular neuritis versus  something else such as a venous thromboembolus in [name] brain, although  less likely.  [name] [name] states she still has stuffy sinuses, overall  feels like she can eat, and knows that she is getting weaker.  [name]  biggest complaint at this time is that she has constipation and has  received an enema for this to see if this helps.   OBJECTIVE:  VITAL SIGNS:  Temperature is 98.7, blood pressure 159/92,  pulse 70, respirations 20, saturating 95% on room air.  CBGs have been  194, 189 and 201, respectively.  GENERAL:  This is an obese white female lying in bed in no acute  distress.  HEENT:  Unremarkable.  LUNGS:  Clear to auscultation bilaterally.  HEART:  Regular rate and rhythm.  No murmurs, gallops or rubs.  ABDOMEN:  Protuberant, soft, positive bowel sounds.  EXTREMITIES:  No lower extremity edema.  NEUROLOGIC:  [name] moving all extremities without deficit.   LAB WORK OBTAINED:  TSH was 0.991, free T4 is 1.05.  CBC has white count  of 9.8, hemoglobin of 13.6.  BMET:  Sodium 143, potassium 4.4, chloride  108, CO2 31, glucose 188, BUN 20, creatinine 1.30, calcium of 9.1.   IMPRESSION:  This is a 75 year old with acute onset of ataxia, nausea  and vomiting and some dizziness.  Question of acute vestibular neuritis,  being followed by Dr. Gerilyn Pilgrim as well.   ASSESSMENT AND PLAN:  1. Nausea and vomiting.  This is resolved.  [name] [name]  states that      she tolerated a clear liquid diet yesterday.  Will advance her diet      further today.  2. Acute renal failure.  This is resolving with IV fluids.  We will      continue with [name] 100 mL/hr. for now.  3. Dizziness and ataxic gait.  Per Dr. Ronal Fear assessment, could be      vestibular neuritis and treating symptomatically.  4. Question of sinusitis given severe tenderness of her sinuses.  Will      continue Levaquin and started her on Afrin spray yesterday, which      [name] [name] states may have helped some.  5. Constipation.  [name] [name] is immobile and this is contributing to      her constipation.  Will continue to monitor after getting [name] enema      and will try other possibilities if needed.  6. Hypothyroidism.  Check her TSH and free T4, and  doing well at this      time.  7. Diabetes mellitus type 2.  On sliding scale insulin at this time.      May need to add on long-acting insulin if continues on with      advancement of her diet.      Catalina Pizza, M.D.  Electronically Signed     ZH/MEDQ  D:  09/21/2007  T:  09/21/2007  Job:  [id number]

## 2011-05-07 NOTE — Consult Note (Signed)
NAME:  [name] [name], [name] [name]              ACCOUNT NO.:  [id number]   MEDICAL RECORD NO.:  [id number]          PATIENT TYPE:  INP   LOCATION:  A214                          FACILITY:  [facility]   PHYSICIAN:  Catalina Pizza, M.D.        DATE OF BIRTH:  [birth year]   DATE OF CONSULTATION:  09/20/2007  DATE OF DISCHARGE:                                 CONSULTATION   SUBJECTIVE:  [name] [name] is a 75 year old obese white female who is  having dizziness and nausea and vomiting and abnormal equilibrium for  several days prior to admission, initially thought related to stroke but  MRI and CT scan were negative.  Was seen and assessed by Dr. Gerilyn Pilgrim  and question whether this is related to vestibular neuritis.  She states  she still has some mild headache but may be a little bit better at this  time.  Still complaining of very stuffy sinuses and drainage from her  nose.   OBJECTIVE:  VITAL SIGNS:  Temperature is 97.2, blood pressure is 144/69,  pulse 76, respirations 20, sating 95% on 2L oxygen.  The CBGs have been  221, 158 and 167, respectively.  GENERAL:  This is an obese white female lying in bed, mildly sick  appearing but better than yesterday, no acute distress.  HEENT:  Unremarkable.  Mucous membranes are dry.  LUNGS:  Clear to auscultation bilaterally.  HEART:  Regular rate and rhythm.  No murmurs, gallops or rubs.  ABDOMEN:  Protuberant, soft with positive bowel sounds.  EXTREMITIES:  No lower extremity edema.  NEUROLOGIC:  Patient moving all extremities.  Strength is bilateral,  does not have any pronator drift.  Alert and oriented x3.  No specific  cerebellar dysfunction noted.   A CBC today shows a white count of 10.7, hemoglobin of 13.8.  A BMET  shows a sodium of 141, potassium 4.4, chloride 108, CO2 25, glucose 145,  BUN 27, creatinine 1.69 and a calcium of 9.  Chest x-ray revealed mild  cardiac enlargement without acute pulmonary process.   IMPRESSION:  This is a 75 year old with acute  onset of ataxia, nausea,  vomiting and some dizziness.  Question whether acute vestibular  neuritis, brought up by Dr. Gerilyn Pilgrim, is the etiology.  Also possibility  is acute sinusitis with vestibular involvement.   ASSESSMENT AND PLAN:  1. Nausea and vomiting.  She is doing better at this time as far as      her nausea is concerned.  Will continue treatment for this and      advance her diet, she has not had any vomiting recently.  2. Acute renal failure.  Her creatinine has stabilized.  Will hold off      on the Cozaar and continue on the IV fluids for now.  She is not      having any signs of acute fluid overload.  3. Dizziness and ataxic gait.  Dr. Gerilyn Pilgrim had suggested getting an      MRV to rule out venous thrombosis as a cause of her headache, but I      am  unable to do today.  Question whether the headache was related      to Aggrenox which was started on her.  She states it is mildly      better but continues to have very stuffy sinuses.  4. Question of sinusitis.  Will continue on the IV Levaquin at this      point, question whether this is helping some, and will also give      low-dose Afrin Nasal Spray, see if this will open her head up some      and get her some relief.  I did warn her side effects of increase      in her blood pressure, but she feels that she feels so bad with the      stuffy head that she would gladly have this medicine to get some      relief.  5. Hypothyroidism.  A TSH and free T4 are still pending.  6. Diabetes mellitus type 2.  She is nothing by mouth but will      continue on sliding scale insulin at this time.   I appreciate any input from Dr. Gerilyn Pilgrim.      Catalina Pizza, M.D.  Electronically Signed     ZH/MEDQ  D:  09/20/2007  T:  09/20/2007  Job:  [id number]

## 2011-05-07 NOTE — Consult Note (Signed)
NAME:  [name] [name], KRUSER              ACCOUNT NO.:  [id number]   MEDICAL RECORD NO.:  [id number]           PATIENT TYPE:   LOCATION:                                 FACILITY:   PHYSICIAN:  Gerrit Friends. Dietrich Pates, MD, FACCDATE OF BIRTH:  05/19/1936   DATE OF CONSULTATION:  04/19/2008  DATE OF DISCHARGE:                                 CONSULTATION   REFERRING PHYSICIAN:  Doralee Albino. Carola Frost, M.D.   PRIMARY CARE PHYSICIAN:  Dr. Ishmael Holter. Renard Matter, MD.   PRIMARY CARDIOLOGIST:  Noralyn Pick. Eden Emms, MD, [facility]   HISTORY OF PRESENT ILLNESS:  A 75 year old woman with a history of  coronary artery disease and prior CABG surgery, now admitted for a right  humeral fracture after a fall.  Cardiology assistance with medical  management requested.   The patient's cardiac history dates back approximately a decade, when  she developed angina.  She underwent a number of percutaneous  interventions in the left anterior descending coronary artery,  ultimately requiring CABG surgery with placement of an LIMA graft to the  LAD and a saphenous vein graft to the first diagonal.  There was no  significant disease in the circumflex or RCA.  Her most recent repeat  catheterization occurred in February 2007, at which time the native LAD  was totally occluded.  Grafts were in place and patent.  She had some  distal LAD disease as well as a 60% lesion in a ramus intermedius.  Left  ventricular systolic function was normal.   She also has a history of diastolic congestive heart failure.  She has  suffered a TIA or CVA in the past.  She has multiple contributing  factors to her vascular disease including hypertension, diabetes, and  hyperlipidemia.   PAST MEDICAL HISTORY:  Past medical history is otherwise notable for  obesity, hypothyroidism, obstructive sleep apnea, vestibular neuritis,  and multiple surgical procedures including orthopedic surgeries on her  lumbosacral spine, her knee, bilateral carpal tunnel releases,  and  rotator cuff surgery.  She has also undergone appendectomy and total  abdominal hysterectomy.   CURRENT MEDICATIONS:  1. Losartan 100 mg daily.  2. Levothyroxine 0.25 mg daily.  3. Atorvastatin 20 mg daily.  4. Lantus insulin 55 units b.i.d.  5. Aspirin 81 units daily.  6. Albuterol by MDI.  7. Ipratropium by MDI.  8. Xanax 0.5 mg p.r.n.  9. Metformin 500 mg b.i.d.  10.Hydrocodone 10/500 mg p.r.n.   ALLERGIES:  A PRIOR ADVERSE REACTION TO CODEINE IS REPORTED.   SOCIAL HISTORY:  Married and lives in [place]; no use of tobacco  products nor alcohol.  Retired from work as a Neurosurgeon.   FAMILY HISTORY:  Markedly positive for coronary disease.   REVIEW OF SYSTEMS:  Notable for cholecystectomy in addition to her other  surgical procedures.  She has had a recent illness characterized by  cough and sputum production without fever or rigors.  The patient  continues to have chronic back pain.  She follows a diabetic diet.  She  otherwise denies abnormalities in all other systems.   PHYSICAL EXAMINATION:  GENERAL:  On exam, pleasant overweight woman  lying in bed with her right arm in a cast.  VITAL SIGNS:  The temperature is 98.1, heart rate 72 and regular,  respirations 16, and blood pressure 110/60.  HEENT:  Anicteric sclerae; normal lids and conjunctivae; normally EOMs;  normal oral mucosa.  NECK:  No jugular venous distention; normal carotid upstrokes without  bruits.  HEMATOPOIETIC:  No adenopathy.  ENDOCRINE:  No thyromegaly.  SKIN:  No significant lesions.  CARDIAC:  Distant first and second heart sounds.  LUNGS:  Decreased breath sounds at the bases.  ABDOMEN:  Soft and nontender; no masses; no organomegaly.  EXTREMITIES:  No edema; distal pulses intact.   DIAGNOSTIC STUDIES:  Chest x-ray:  Mild chronic increase in interstitial  markings.   EKG:  Sinus bradycardia; left anterior fascicular block; markedly  delayed R-wave progression, probably representing  improper lead  placement; however, prior anterior myocardial infarction cannot be  excluded.  ST-T wave abnormality consistent with lateral ischemia or  LVH.  No prior tracing for comparison.   IMPRESSION:  [name] has done generally well since undergoing  coronary artery bypass graft surgery 8 years ago.  She has no signs nor  symptoms at present to suggest recurrent myocardial ischemia.  Her  chronic medical therapy is appropriate.  We will be happy to assist with  this nice woman's care as necessary while she is in hospital and to  follow her thereafter in the office.      Gerrit Friends. Dietrich Pates, MD, [facility]  Electronically Signed     RMR/MEDQ  D:  04/19/2008  T:  04/20/2008  Job:  [id number] [phone]

## 2011-05-07 NOTE — Group Therapy Note (Signed)
NAME:  [name] [name], [name] [name]              ACCOUNT NO.:  [id number]   MEDICAL RECORD NO.:  [id number]           PATIENT TYPE:  INP   LOCATION:  A214                          FACILITY:  [facility]   PHYSICIAN:  Angus G. Renard Matter, MD   DATE OF BIRTH:  [birth year]   DATE OF PROCEDURE:  DATE OF DISCHARGE:  09/22/2007                                 PROGRESS NOTE   This patient has continued to vomit intermittently yesterday so NG tube  was inserted and she is had a more comfortable night with resting.  She  was admitted with dizziness and inability to walk weakness on left side.  She developed nausea had some slurred speech.  MRA of the head showed no  acute abnormality, advanced chronic small vessel disease.  The vessels  of the neck showed no evidence of carotid stenosis.  The patient did  have echocardiogram.  Did not show evidence of source of embolus.   OBJECTIVE:  VITAL SIGNS:  Blood pressure with 105/61, respirations 18,  temperature 97, blood sugars ranged 150-186.  Chemistries and  electrolytes within normal limits with exception of elevated glucose  504.  LUNGS:  Clear.  HEART:  Regular rhythm.  Abdomen slightly distended.  EXTREMITIES:  No edema.   ASSESSMENT:  The patient does have a history of insulin-dependent  diabetes, hypertension, coronary artery disease, and previous strokes  some several years ago.  This episode may represent TIA or stroke and  embolism.   PLAN:  To continue nasogastric suction to help with problem of retching  and vomiting, continue to monitor vital signs, and neuro checks.  Will  obtain neurology consult.      Angus G. Renard Matter, MD  Electronically Signed     AGM/MEDQ  D:  09/18/2007  T:  09/18/2007  Job:  [id number]

## 2011-05-07 NOTE — Consult Note (Signed)
NAME:  [name] [name], SILBERSTEIN              ACCOUNT NO.:  [id number]   MEDICAL RECORD NO.:  [id number]          PATIENT TYPE:  INP   LOCATION:  5003                         FACILITY:  [facility]   PHYSICIAN:  Doralee Albino. Carola Frost, M.D. DATE OF BIRTH:  [birth year]   DATE OF CONSULTATION:  DATE OF DISCHARGE:                                 CONSULTATION   REASON FOR CONSULTATION:  Right proximal humeral shaft fracture.   HISTORY OF PRESENT ILLNESS:  [name] is a very pleasant 75-year-  old Caucasian female who states that earlier this morning she fell  outside on her way to present for jury duty.  The patient states that  she was walking up steps, subsequently tripped over the stairs resulting  in a fall onto her right arm and shoulder.  The patient had immediate  onset of pain and inability to move the arm.  She does not recall any  syncopal episodes, no recollection of blacking out or passing out prior  to the fall.  The patient states that she did not strike her head.  Currently, the patient does not complain of any nausea or vomiting.  No  recent episodes of fevers or chills.  The patient does report some  numbness and tingling on the dorsal aspect of her right hand, more  pronounced over the first dorsal webspace.  The patient also states that  she feels somewhat weak and is unable to move her elbow and wrist  secondary to pain and discomfort.   The patient does have a recent history notable for an exacerbation of  her chronic bronchitis for which she was treated approximately 3 weeks  ago with the use of antibiotics.   Currently, Ms. Potts is resting comfortably in the emergency  department on a stretcher.  She currently is in Alumafoam splint, which  was applied by EMS.  She is bracing her right upper extremity across her  chest in an adductor position.  She is very hesitant to move her arm at  all.  She does complain of some pain right now, but is approximately  6/10, and it is more  severe and exacerbated upon moving her arm.  As  described before, there is some numbness and tingling on the dorsal  aspect of her right hand.  She does complain of her weakness in her hand  and inability to move her wrist and elbow secondary to pain.  Ms.  Urbani's pain is described as sharp in nature, exacerbated with  movements, and has been relieved with the use of narcotic medications  while in the emergency department.   ALLERGIES:  CODEINE, which causes nausea per the patient.   PAST MEDICAL HISTORY:  Significant for coronary artery disease for which  the patient underwent coronary artery bypass graft back in 2000.  The  patient is currently followed by [facility] [employer].  The patient also  has hypertension, insulin-dependent type 2 diabetes, chronic bronchitis,  and history of CVA/TIA.  The patient also has a history of  hypothyroidism and also has a history of bilateral carpal tunnel  syndrome for which she has  undergone bilateral release of it.  The  patient also has a history of sleep apnea.   PAST SURGICAL HISTORY:  Incudes right rotator cuff repair and left knee  arthroscopic meniscectomy.   FAMILY HISTORY:  Significant for coronary artery disease and diabetes  mellitus.   The patient's primary care doctor is Dr. Ishmael Holter. Renard Matter, MD of  [place].   SOCIAL HISTORY:  The patient is a retired Neurosurgeon.  Denies any  alcohol use.  No tobacco use.  She is not married.   CURRENT MEDICATIONS:  Cozaar, Synthroid, Lipitor, Lantus, aspirin,  albuterol/ipratropium, Xanax, and metformin.   REVIEW OF SYSTEMS:  As noted above in the HPI.   PHYSICAL EXAMINATION:  VITAL SIGNS:  Temperature 97.1, pulse 67,  respirations 22, and BP 112/67.  GENERAL:  This is an obese female with quite discomfort secondary to the  pain, but is resting in the emergency department on a stretcher.  HEENT:  Atraumatic.  Pupils equal, round, and reactive to light and  accommodation.  Moist  mucous membranes.  NECK:  Supple.  No lymphadenopathy and no bruits.  CHEST:  Clear to auscultation on bilateral anterior field.  BREAST:  Not indicated in procedure.  HEART:  S1 and S2.  Regular rate and rhythm.  ABDOMEN:  Obese, soft, nontender, and nondistended with positive bowel  sounds.  PELVIS:  No pelvic instability is noted.  GENITOURINARY:  Deferred and not indicated in the procedure.  EXTREMITIES:  Left upper extremity, right lower extremity, and left  lower extremity are within normal limits.  No gross deformities.  No  pain with palpation.  Soft tissue is intact, and no pain with palpation.  The patient demonstrates full range of motion with movements of these  joints.   Her right upper extremity, upon initial inspection, the patient noted to  be in Alumafoam splint holding her arm adduction at 0 degrees of flexion  and extension.  No gross deformity is appreciated on initial inspection.  The patient does have a significant tenderness with palpation of the  proximal aspect of the humerus.  The patient was not ranged for short  range of motion secondary to the pain.  Elbow range of motion was also  not performed secondary to pain.  No ecchymosis noted.  The patient does  have some decreased sensation along the radial nerve, most notably at  the first dorsal webspace.  Ulnar, median, and axillary nerve sensation  is intact to light touch.  Ulnar motor function and median motor  function appears to be grossly intact.  There does appear to be some  decreased function of the radial nerve testing as the patient has  difficulty with wrist extension and digit extension.  The patient also  has difficulty performing maneuvers test of the posterior interosseous  nerve.  I do not appreciate significant amount of swelling in the right  upper extremity.  There is a strong 2+ radial pulse.  The skin has  appropriate color and temperature.   LABORATORY DATA:  X-rays, plain films  demonstrated comminuted displaced  fractures of the right proximal humerus.   ASSESSMENT AND PLAN:  Ms. Mccowen is a 75 year old Caucasian female  with a history of coronary artery bypass graft and recent exacerbation  of chronic bronchitis, 3 weeks ago with a right proximal humeral shaft  fracture.   1. Plan for open reduction and internal fixation of the right proximal      humerus today.  2. Consent for open reduction  and internal fixation of right proximal      humeral fracture.  We will apply splint to right arm now for      comfort.  We will get a CT scan of the right shoulder, proximal      humerus with 3-D reconstruction to further assess the fracture      pattern and to ensure that there is no violation of the joint space      or extension into the articular surface of the humeral head.  Given      the history of the coronary artery bypass grafting and recent      exacerbation of chronic bronchitis, we will consult Cardiology and      Internal Medicine.  I have spoken to the patient's primary care      doctor, Dr. Ishmael Holter. Mc[place].  He also suggested that we get a      General Medicine consult for this patient.  3. The patient will go ahead on-call to the OR.  We will provide Ancef      2 g IV on-call to the OR for antibiosis.  The patient will then be      admitted to the Orthopedic Service of Dr. Carola Frost for observation and      pain control.   Please note that the patient was seen and examined with Dr. Carola Frost.      Mearl Latin, PA      Doralee Albino. Carola Frost, M.D.  Electronically Signed    KWP/MEDQ  D:  04/18/2008  T:  04/19/2008  Job:  [id number]

## 2011-05-07 NOTE — Group Therapy Note (Signed)
NAME:  [name], [name]              ACCOUNT NO.:  [id number]   MEDICAL RECORD NO.:  [id number]          PATIENT TYPE:  INP   LOCATION:  A214                          FACILITY:  [facility]   PHYSICIAN:  Catalina Pizza, M.D.        DATE OF BIRTH:  18-Jun-1936   DATE OF PROCEDURE:  DATE OF DISCHARGE:                                 PROGRESS NOTE   SUBJECTIVE:  [name] [name] is a 75 year old obese white female who was  having worsening dizziness and felt to have stroke-like symptoms  initially, but has been having nausea and vomiting for approximately one  week, worsening over the last two to three days.  She has had a headache  and continues to have nausea and vomiting, has had a full evaluation to  rule out stroke.  I believe at this point she is having some type of  either gastritis or sinus-type infection which is contributing to much  of her dizziness.  She has also had very poor p.o. intake for  approximately one week, becoming more dehydrated at this time.  She  states that her speech and movement is normal, but generalized weakness  when standing.   OBJECTIVE:  VITAL SIGNS:  Temperature is 98.0, blood pressure 118/65,  pulse 66, respirations 20, satting 97% on 2 liters of oxygen.  GENERAL:  This is an obese white female lying in bed, mildly sick-  appearing but in no acute distress.  HEENT:  Unremarkable.  Mucous membranes are dry.  LUNGS:  Clear to auscultation bilaterally.  HEART:  Regular rate and rhythm.  No murmurs, gallops, rubs.  ABDOMEN:  Protuberant, definitely has some epigastric-type tenderness,  but positive bowel sounds.  EXTREMITIES:  No lower extremity edema appreciated.  NEUROLOGIC:  Patient moving all extremities.  Strength is bilateral.  Generalized weakness were 4+/5 in all extremities.  No significant  cerebellar dysfunction noted.  The patient is alert and oriented x3.   LABORATORY DATA:  BMET today shows sodium 137, potassium 4.7, chloride  101, CO2 28, glucose of  219, BUN of 27, creatinine of 1.78, GFR 28,  calcium 8.9.   IMPRESSION:  This is a 75 year old who initially presented with some  weakness, ataxic-type gait and dizziness.  Apparently, a neurology  consult was ordered, but I did not see that order apparent at this time.  Given her symptomatology at this time, I do not believe this is related  to stroke-type disease, may be related to a sinus-type infection or  other type infection with dehydration.   ASSESSMENT/PLAN:  1. Nausea and vomiting.  It appears this cold be related to infection      of some sort or gastritis.  She continues to have nausea, vomiting      and will try to ease things off with IV Protonix, as well as IV      Zofran on a p.r.n. basis.  If that does not work, then will try      Phenergan suppository at that time.  2. Acute renal failure.  She has become more and more dehydrated.  Will need to resume her fluids.  Her creatinine has continued to      increase.  Will hold her Cozaar, as well as increase her fluids,      see if this turns around, re-check in the morning.  3. Dizziness/ataxic gait.  It appears to be more related to weakness      related to dehydration and illness.  She does not exhibit any      specific neurologic complaints at this time and had CT scan MRI and      all that, and which has essentially been negative.  4. Hypothyroidism.  Will check a TSH and free T4, to see if this is      contributing to this.  5. Diabetes mellitus type 2.  Her sugars are slightly running up, but      since she is NPO will continue a sliding scale insulin at this      time.   DISPOSITION:  The patient will need to get IV fluids, which are unclear  whether she was having problems with IV access before, but if so then we  need to get a PICC line placed on her, to maximize her fluid.  If we  need to replace the Foley, then will do so and treat her symptoms  appropriately.      Catalina Pizza, M.D.  Electronically  Signed     ZH/MEDQ  D:  09/19/2007  T:  09/19/2007  Job:  [id number]

## 2011-05-07 NOTE — Procedures (Signed)
NAME:  [name] [name], [name] [name]              ACCOUNT NO.:  [id number]   MEDICAL RECORD NO.:  [id number]          PATIENT TYPE:  INP   LOCATION:  A214                          FACILITY:  [facility]   PHYSICIAN:  Noralyn Pick. Eden Emms, MD, FACCDATE OF BIRTH:  07-30-1936   DATE OF PROCEDURE:  DATE OF DISCHARGE:                                ECHOCARDIOGRAM   INDICATION:  CVA, status post CABG.   FINDINGS:  Left ventricular chamber size is normal.  There is mild LVH  with ejection fraction of 60%.  There are no definitive regional wall  motion abnormalities.  Mitral valve is mildly thickened with trivial MR.  There is mild biatrial enlargement.  The aortic valve is trileaflet and  sclerotic.  There is mild aortic root dilatation.  Right-sided cardiac  chambers are normal with no evidence of pulmonary hypertension.  There  is mild  TR.  Subcostal imaging revealed no atrioseptal defect, no  source of embolus and no effusion.   M-MODE:  Measurement showed an aortic dimension of 26 mm, left atrial  dimension 43 mm, septal thickness 12 mm, LV diastolic dimension 42 mm,  LV systolic dimension 27 mm.   FINAL IMPRESSION:  1. Mild left ventricular hypertrophy, ejection fraction of 60%.  2. Aortic valve sclerosis.  3. Trivial mitral regurgitation.  4. Mild biatrial enlargement.  5. No source of embolus.  6. No pericardial effusion.  7. Normal right-sided cardiac chambers with mild tricuspid      regurgitation.      Noralyn Pick. Eden Emms, MD, [facility]  Electronically Signed     PCN/MEDQ  D:  09/17/2007  T:  09/18/2007  Job:  [id number]   cc:   Angus G. Renard Matter, MD  Fax: [phone]

## 2011-05-07 NOTE — Group Therapy Note (Signed)
NAME:  [name] [name], [name] [name]              ACCOUNT NO.:  [id number]   MEDICAL RECORD NO.:  [id number]          PATIENT TYPE:  INP   LOCATION:  A214                          FACILITY:  [facility]   PHYSICIAN:  Angus G. Renard Matter, MD   DATE OF BIRTH:  Apr 13, 1936   DATE OF PROCEDURE:  DATE OF DISCHARGE:                                 PROGRESS NOTE   This patient attempted to get out of bed this morning and fell because  of left-sided weakness.   EXAMINATION:  The patient does have motor weakness, left arm, left leg.   Continue to monitor neuro checks and vital signs.  Continue previous  recommendations.      Angus G. Renard Matter, MD  Electronically Signed     AGM/MEDQ  D:  09/17/2007  T:  09/17/2007  Job:  [id number]

## 2011-05-07 NOTE — Discharge Summary (Signed)
NAME:  [name] [name], [name] [name]              ACCOUNT NO.:  [id number]   MEDICAL RECORD NO.:  [id number]          PATIENT TYPE:  INP   LOCATION:  A214                          FACILITY:  [facility]   PHYSICIAN:  [name] [name], M.D.        DATE OF BIRTH:  07-22-1936   DATE OF ADMISSION:  09/16/2007  DATE OF DISCHARGE:  09/30/2008LH                               DISCHARGE SUMMARY   DISCHARGE DIAGNOSES:  1. Ataxic gait  2. Vestibular neuritis  3. Acute renal failure  4. Sinusitis.  5. Hypertension.  6. Diabetes mellitus, type 2, insulin dependent.  7. Obstructive sleep apnea.  8. Nausea and vomiting.  9. Hypothyroidism.   DISCHARGE MEDICATIONS:  1. Synthroid 25 mcg p.o. daily.  2. Lipitor 20 mg p.o. daily.  3. Lantus 55 units in the morning, 50 units in the evening.  4. Aspirin 81 mg p.o. daily.  5. Albuterol inhaler p.r.n.  6. Albuterol and Atrovent nebulizer q.6 h p.r.n.  7. Xanax 0.5 mg q.6 h p.r.n. for anxiety.  8. Metformin 500 mg b.i.d.  9. Apidra on sliding scale.  10.Phenergan 25 mg 1/2 to 1 tablet p.o. q.6 h p.r.n. for nausea and      vomiting.  11.Levaquin 500 mg p.o. daily x7 more days.  12.The patient is to hold her Cozaar 100 mg a daily until seen back in      the office.   BRIEF HISTORY OF PRESENT ILLNESS:  [name] [name] is a 75 year old obese  female who initially presented with ataxia-type gait and some numbness.  Numbness on her left side initially thought a possible stroke or TIA.  Was started on Aggrenox and admitted to hospital for further evaluation.  She had full neurologic evaluation including consultation by Dr.  Gerilyn [name].  There were no signs of TIA or stroke apparent blamed on the  conclusion that it could have been vestibular neuritis that was causing  a lot of this ataxic gait and headache. May have also been exacerbated  by the Aggrenox as well contributing to the headache which was started  the hospital.   OBJECTIVE FINDINGS AT TIME OF DISCHARGE:  VITAL SIGNS:   Temperature is  97.3, blood pressure 141/72, pulse of 67, respirations 20, saturating 94-  95% on room air.  CBGs in the 195,162, 251, respectively.  GENERAL:  This is an obese white female lying in bed much less sick  appearing at this time, actually smiled some.  HEENT:  Unremarkable.  Pupils equal, round and reactive to light and  accommodation.  Mucous membranes are moist.  LUNGS:  Clear to auscultation bilaterally.  HEART:  Regular rate and rhythm.  No murmurs, gallops or rubs.  ABDOMEN:  Soft, no specific tenderness noted.  Abdominal exam had  positive bowel sounds.  EXTREMITIES:  No lower extremity edema.  NEUROLOGIC:  The patient is moving all extremities without difficulty.  She had very mild nystagmus.  She is not having any deficits in her  sensation. The patient did ambulate with walker per physical therapy and  did well as 120 feet with walker. Did not have  any signs of cerebellar  dysfunction or significant ataxic gait at this time.   PERTINENT LABORATORIES OBTAINED DURING HOSPITALIZATION:  Initial CBC  showed white count of 9,  hemoglobin 14.2.  On the day before discharge,  white count 9.80, hemoglobin of 13.6, platelet count of 194.  BMET  initially shows sodium 139, potassium 3.8, chloride 107, CO2 27, glucose  275, BUN 11, creatinine 0.83, calcium of 9.4.  Creatinine began to  increase up to a maximum BUN of 27, creatinine 1.78, but trended back  down at time of discharge when BUN was 20, creatinine 1.30.  TSH is  0.991.  Free T4 was 1.05.   RADIOGRAPHIC FINDINGS:  Stable CT of head, no intracranial findings.  Chronic partial mastoid opacification on the left.   MRI/MRA of brain showed no acute intracranial abnormality, advanced  chronic small vessel disease for her age.   MRA of neck showed no carotid stenosis. Did not visualize vertebral  arteries that significantly.   Chest x-ray revealed mild cardiac enlargement without acute pulmonary  process.   MRV  showed no strong evidence for venous thrombus. No other specific  findings noted.   HOSPITAL COURSE PER PROBLEM:  #1.  VESTIBULAR NEURITIS OR SINUSITIS:  It is unclear exactly what is  causing the patient's dizziness and ataxic type gait but has improved  significantly during hospitalization both with fluids as well as now  patient is tolerated eating and ambulating with some assistance. Was  seen and assessed by [name] [name], and no specific treatment at this  time for it.  She was given Aggrenox initially thinking related to  stroke, but this was stopped due to headache and no findings consistent  with stroke.  I question if some of this was related to sinusitis with  some mild bulge in her ear drums.  She was started on Levaquin 500 mg  during hospitalization and will remain on a full 10-day course of this.  Will continue to monitor for any other further signs of vestibular  neuritis.   #2.  NAUSEA AND VOMITING:  The patient initially was having nausea and  vomiting felt secondary to vertigo and vestibular neuritis-type  symptoms. It improved with medications.  The patient's diet was advanced  and was tolerating a regular diet routinely and drinking fluids well.   #3.  ACUTE RENAL FAILURE:  Question whether or not this is related to  some contrast given related to an MRI or whether  it is related to  dehydration. Fluids were increased during hospitalization, and  creatinine peaked at 1.78 but started trending back down and will need  to be rechecked in the office next week.   #4.  CONSTIPATION:  The patient was having some constipation and was  given an enema and several other medications, stool softener to help  with this.  I believe she did have a bowel move prior to discharge. She  did feel better as far that is concerned.   #5.  HYPOTHYROIDISM:  TSH and free T4 within good range. Will continue  on the same dosage.   #6.  DIABETES MELLITUS, TYPE 2:  She is to resume her  at-home regimen as  long as she is eating and drinking normally. It was held during  hospitalization secondary to her n.p.o. status initially and decreased  p.o. intake but at this time is improved.   DISPOSITION:  The patient will be discharged to home.  Will get home  health PT to further assess  her at home.  Need to get bedside commode as  well as a rolling walker for the patient, and she will follow up with  Dr. Renard Matter and Dr. Margo Aye in approximately 1 to 1-1/2 weeks for recheck  of all of this.  If any other further neurologic concerns arise, the  patient is to return to the office or return to the emergency department  as soon as possible.      [name] [name], M.D.  Electronically Signed     ZH/MEDQ  D:  09/22/2007  T:  09/22/2007  Job:  [id number]

## 2011-05-10 NOTE — Group Therapy Note (Signed)
NAME:  [name] [name], [name] [name]              ACCOUNT NO.:  [id number]   MEDICAL RECORD NO.:  [id number]          PATIENT TYPE:  INP   LOCATION:  A204                          FACILITY:  [facility]   PHYSICIAN:  Angus G. Renard Matter, MD   DATE OF BIRTH:  06-Aug-1936   DATE OF PROCEDURE:  09/15/2006  DATE OF DISCHARGE:  09/16/2006                                   PROGRESS NOTE   This patient has generalized headache, and she continues to have some cough  and congestion. Her blood sugars range from 297-345 on Lantus and sliding  scale insulin.   OBJECTIVE:  Blood pressure 132/73, respirations 22, pulse 67, temperature  97.9. Lungs: Diminished breath sounds. Heart:  Regular rhythm.  Abdomen:  No  palpable organs or masses.   ASSESSMENT:  Admitted with bronchitis, exacerbation of chronic obstructive  pulmonary disease.  Has been treated for hyperglycemia and poorly controlled  diabetes.   PLAN:  To continue monitoring blood sugars, continue sliding scale insulin,  continue current antibiotic regimen and nebulizer treatments. Plan to  gradually increase Lantus insulin.      Angus G. Renard Matter, MD  Electronically Signed     AGM/MEDQ  D:  09/15/2006  T:  09/16/2006  Job:  [id number]

## 2011-05-10 NOTE — Group Therapy Note (Signed)
NAME:  [name] [name], [name] [name]              ACCOUNT NO.:  [id number]   MEDICAL RECORD NO.:  [id number]          PATIENT TYPE:  INP   LOCATION:  A211                          FACILITY:  [facility]   PHYSICIAN:  Angus G. Renard Matter, MD   DATE OF BIRTH:  Apr 29, 1936   DATE OF PROCEDURE:  DATE OF DISCHARGE:                                 PROGRESS NOTE   This patient been treated for chronic obstructive pulmonary disease and  bronchitis.  She does have insulin-dependent diabetes and her diabetes  has been aggravated by steroids.  She does have a history of coronary  artery disease, CHF, hypothyroidism.  She continues to have some  difficulty with her breathing but her condition is some better.   OBJECTIVE:  VITAL SIGNS:  Blood pressure 153/83, respirations 20, pulse  71 respirations 27, temperature 97.2.  Blood sugars have ranged from 99-  179.  RESPIRATORY:  The patient has diminished breath sounds.  HEART:  Regular rhythm.  ABDOMEN:  No palpable organs or masses.   ASSESSMENT:  Patient admitted with exacerbation of chronic obstructive  pulmonary disease and bronchitis.  She  does have insulin-dependent  diabetes aggravated by steroids, history of coronary artery disease, CHF  and hypothyroidism.  Her overall condition has improved.   PLAN:  Continue current regimen.      Angus G. Renard Matter, MD  Electronically Signed     AGM/MEDQ  D:  02/25/2007  T:  02/25/2007  Job:  [id number]

## 2011-05-10 NOTE — Cardiovascular Report (Signed)
NAME:  [name] [name], [name] [name]                           ACCOUNT NO.:  [id number]   MEDICAL RECORD NO.:  [id number]                   PATIENT TYPE:  INP   LOCATION:  2930                                 FACILITY:  [facility]   PHYSICIAN:  Veneda Melter, M.D. [facility]               DATE OF BIRTH:  1936-07-19   DATE OF PROCEDURE:  01/15/2003  DATE OF DISCHARGE:                              CARDIAC CATHETERIZATION   PROCEDURE PERFORMED:  1. Left heart catheterization.  2. Left ventriculogram.  3. Selective coronary angiography.  4. Selective angiography, saphenous vein and internal mammary bypass grafts.   DIAGNOSES:  1. Native two-vessel coronary artery disease.  2. Normal left ventricular systolic function.  3. Patent saphenous vein and internal mammary bypass grafts.   HISTORY:  The patient is a 75 year old female with hypertension, diabetes  mellitus, and coronary artery disease who had previously undergone coronary  artery bypass graft surgery in July 2001 with placement of a LIMA graft to  the LAD and vein graft to the second diagonal branch.  The patient had chest  discomfort in May 2002, prompting recatheterization showing patency of these  grafts and well-preserved LV function.  She is admitted to the hospital now  with community-acquired pneumonia and complains of severe substernal chest  discomfort and has mild elevation of cardiac enzymes.  In addition, ECG  shows poor R wave progression.  She is referred for urgent angiography.   TECHNIQUE:  Informed consent was obtained.  The patient was brought to the  catheterization lab.  A 7-French sheath was placed in the right femoral  artery using the modified Seldinger technique.  The 6-French JL4 and JR4  catheters were then used to engage the left and right coronary arteries, and  selective angiography performed in various projections using manual  injections of contrast.  A JR4 catheter was then used to engage the  saphenous vein graft to the  second diagonal branch, and IM catheter was used  to engage the LIMA graft to the LAD.  Subsequently, a 6-French pigtail  catheter was advanced to the left ventricle, and left ventriculogram  performed using power injections of contrast.  At the termination of the  case, the catheters were removed, and the sheath were secured in position,  and the patient was transferred to the CCU in stable condition.  She  tolerated the procedure well.  Findings are as follows:   FINDINGS:  1. Left main trunk:  Large caliber vessel with mild irregularities.  2. LAD:  This begins as a medium caliber vessel and tapers as it approaches     the apex.  It provides two diagonal branches in the mid section.  The LAD     has a previously placed stent in the mid section encompassing the first     and second diagonal branches.  There is in-stent restenosis of 80-90%.     The  first diagonal branch is a trivial vessel with ostial compromise of     70%; however, it exhibits TIMI-3 flow.  The second diagonal branch     bifurcates in its mid section.  It fills predominantly via a saphenous     vein graft anastomosed to the lateral division.  There is mild     anastomotic disease of 50%.  Retrograde flow to the LAD and a major     septal perforator are noted.  The distal LAD fills via LIMA graft.  It is     a small caliber vessel and exhibits a moderate narrowing of 50% prior to     the apex.  3. Left circumflex artery:  This is a large caliber vessel that provides two     marginal branches in the distal section.  The AV circumflex has mild     disease of 30%.  4. Ramus intermedius:  This is a small caliber vessel that provides the     anterolateral wall.  There is a tubular narrowing at the ostium in the     proximal segment of 60%.  5. Right coronary artery:  Dominant.  This is a large caliber vessel that     provides a posterior descending artery and posterior ventricular branch     in its terminal segment.  The  right coronary artery has mild disease of     30% in the mid section.  6. Saphenous vein graft to the second diagonal branch:  Patent.  7. LIMA to the LAD:  Patent.  8. LV:  Normal end systolic and end diastolic dimensions.  Overall left     ventricular function appears well-preserved with an ejection fraction of     greater than 55%.  No mitral regurgitation.  LV pressure is 150/10.     Aortic is 150/80.  LVD was 25.   ASSESSMENT/PLAN:  The patient is a 75 year old female with two-vessel  coronary artery disease and well-preserved left ventricular function.  She  appears well revascularized to the left anterior descending.  The  intermedius artery does not appear to have advanced significantly from her  prior angiogram, and it certainly does not appear to be significant enough  to cause rest pain.  The right coronary artery and left circumflex are large  caliber vessels with only mild disease.  At this point, continued medical  therapy will be pursued, and other causes of chest pain investigated.                                               Veneda Melter, M.D. [facility]    NG/MEDQ  D:  01/15/2003  T:  01/15/2003  Job:  [id number]   cc:   Thomas C. Wall, M.D. [facility]  [address]  [place]  [place] [address]  Fax: [phone]   Angus G. Renard Matter, M.D.  [address]  [place]  [place] [address]  Fax: [phone]

## 2011-05-10 NOTE — Group Therapy Note (Signed)
NAME:  [name] [name], [name] [name]              ACCOUNT NO.:  [id number]   MEDICAL RECORD NO.:  [id number]           PATIENT TYPE:  INP   LOCATION:  A211                          FACILITY:  [facility]   PHYSICIAN:  Angus G. Renard Matter, MD   DATE OF BIRTH:  [birth year]   DATE OF PROCEDURE:  02/19/2007  DATE OF DISCHARGE:                                 PROGRESS NOTE   This patient was admitted to the hospital with respiratory distress.  She does have chronic obstructive pulmonary disease and bronchitis,  insulin-dependent diabetes, history of coronary artery disease,  congestive heart failure.  She continues to have some difficulty with  her breathing.   OBJECTIVE:  VITAL SIGNS:  Blood pressure 107/68, respirations 22, pulse  111, temperature 97.5.  Blood sugars have ranged from 251-287.  She has  had intermittent back pain.  Her most recent blood gases show a pO2 of  64.3 and a pCO2 of 24.6.  LUNGS:  Diminished breath sounds.  HEART:  Regular rhythm.  ABDOMEN:  No palpable organs or masses.   ASSESSMENT:  The patient was admitted with exacerbation of chronic  obstructive pulmonary disease.  She does have additional problems of  insulin-dependent diabetes, history of coronary artery disease and  congestive heart failure.   PLAN:  Plan to continue current regimen of IV antibiotics, steroids,  nebulizer treatments, etc.  Continue current regimen.      Angus G. Renard Matter, MD  Electronically Signed     AGM/MEDQ  D:  02/19/2007  T:  02/19/2007  Job:  [id number]

## 2011-05-10 NOTE — Cardiovascular Report (Signed)
NAME:  LE, [name]              ACCOUNT NO.:  [id number]   MEDICAL RECORD NO.:  [id number]          PATIENT TYPE:  OIB   LOCATION:  1967                         FACILITY:  [facility]   PHYSICIAN:  Arvilla Meres, M.D. LHCDATE OF BIRTH:  1936-04-02   DATE OF PROCEDURE:  02/13/2006  DATE OF DISCHARGE:  02/13/2006                              CARDIAC CATHETERIZATION   PRIMARY CARE PHYSICIAN:  Dr. Butch Penny in [place].   CARDIOLOGIST:  Dr. Dionicio Stall, [facility].   PATIENT IDENTIFICATION:  [name] [name] is a 75 year old with multiple  medical problems including coronary artery disease. She has previously  undergone multiple stents of her LAD which were complicated by in-stent  restenosis. She did undergo bypass surgery in 2001 with saphenous vein graft  to the diagonal branch and LIMA to the LAD. Over the past few months, she  has had a recurrent chest pain she says is reminiscent of her previous  angina. She underwent nuclear study which did not show any ischemia but  given her ongoing symptoms was referred for cardiac catheterization in the  outpatient [facility].   PROCEDURES PERFORMED:  1.  Selective coronary angiography.  2.  Left heart cath.  3.  Left ventriculogram.  4.  AngioSeal femoral artery closure.   DESCRIPTION OF PROCEDURE:  The risks and benefits of catheterization were  explained to Mr. Roszak; consent was signed and placed on the chart. A 4-  [place] arterial sheath was placed in the right femoral artery with a  moderate amount of difficulty. Standard catheters including JL-4, JR-4 and  angled pigtail were used for the procedure. Of note, the JR-4 was used for  both the saphenous vein graft to the diagonal as well as the IMA graft. All  catheter exchanges were made over wire. There were no apparent  complications. At the end of the procedure, the femoral artery site was  sealed with AngioSeal closure device with good hemostasis.   Central  aortic pressure is 153/75 with mean of 105. LV pressure 160/4 with  an LVEDP of 12. There is no aortic stenosis.   Left main was normal.   LAD was totally occluded proximally after giving off a small septal  perforator. There is evidence of previously placed stents just distal to the  site of occlusion.   Left circumflex was a moderate-sized vessel. It gave off a small ramus, a  small OM-1 and a moderate-sized OM-2. There were tandem 30% lesions in the  proximal portion of the left circumflex. In the ostium of the small ramus,  there was a 60-70% focal stenosis.   The right coronary artery was a large dominant vessel. It gave off a large  PDA and moderate-sized PL. There was a minor irregularity in the midsection.   The saphenous vein graft to the diagonal was widely patent. It plugged into  the mid-diagonal and filled the distal diagonal well. It also back-filled  the diagonal to show proximal branch of the diagonal as well as previously  placed stent in the ostium of the diagonal and T-stenting with the LAD.  There was significant in-stent  restenosis of the diagonal stent and the LAD  stent. However, there was good flow in the distal diagonal.   The LIMA to the LAD was widely patent. However, the distal LAD was quite  diminutive with poor runoff. The LAD diameter was 1 mm if not smaller.   Left ventriculogram done in the RAO approach showed an EF 70% with no wall  motion abnormalities or mitral regurgitation.   ASSESSMENT:  1.  Two-vessel coronary disease as described above with totally occluded      left anterior descending artery and a borderline lesion in the ostium of      the small ramus.  2.  Saphenous vein graft to diagonal is widely patent.  3.  Left internal mammary artery to left anterior descending artery is      widely patent. However, the distal left anterior descending artery is      quite small with poor runoff.  4.  Normal left ventricular function.    DISCUSSION/PLAN:  There are no obvious targets for further  revascularization. At this point, I would continue aggressive medical  therapy and I have suggested a course of cardiac rehab.      Arvilla Meres, M.D. [facility]  Electronically Signed     DB/MEDQ  D:  02/13/2006  T:  02/14/2006  Job:  [id number]   cc:   Angus G. Renard Matter, MD  Fax: [phone]   Vida Roller, M.D.  Fax: [phone]

## 2011-05-10 NOTE — H&P (Signed)
NAME:  [name] [name], [name] [name]                           ACCOUNT NO.:  [id number]   MEDICAL RECORD NO.:  [id number]                   PATIENT TYPE:  AMB   LOCATION:  DAY                                  FACILITY:  [facility]   PHYSICIAN:  Vickki Hearing, M.D.           DATE OF BIRTH:  [birth year]   DATE OF ADMISSION:  DATE OF DISCHARGE:                                HISTORY & PHYSICAL   CHIEF COMPLAINT:  Left knee pain.   HISTORY OF PRESENT ILLNESS:  [name] [name] is a 75 year old female who fell at  [facility] in [place] on September 1 of 2004. She was initially seen  at an urgent care facility and had an x-ray performed and an evaluation. The  x-rays were normal. She was told she had a bruised new. She had other  abrasions and contusions and also had a bruise to her head and elbow. She  came home to [place] where she lives. She went to the emergency room  because of continued headache. There, she had a CAT scan which was normal.  She was placed in a knee immobilizer for her knee, and I have treated her  for a knee pain for six weeks. Initially, her exam revealed tenderness at  the distal pole of the patella at the patellar tendon patellar junction;  however, her pain is now more medial, and the patellar pain has also not  resolved. She wishes to have orthoscopic surgery for more detailed  evaluation. Her MRI has showed a nondisplaced posterior horn medial meniscal  tear and a possible torn medial patellar retinaculum and some bone bruising  of the medial femoral condyle.   REVIEW OF SYSTEMS:  The patient reports shortness of breath and COPD,  headache after injury which has resolved, thyroid disease, goiter, and  diabetes. General, cardiac, GI, urinary, psychiatric, skin, ear, nose, and  throat systems are negative.   ALLERGIES:  CODEINE.   MEDICAL PROBLEMS:  She is a diabetic. She has some hypertension and coronary  artery disease.   PAST SURGICAL HISTORY:  Her surgery  includes open heart surgery, bilateral  carpal tunnel release, hysterectomy, appendectomy, bilateral rotator cuff  surgery, two lumbar disk surgeries.   CURRENT MEDICATIONS:  Cozaar, Actos, Synthroid, Bayer, Lasix, Lantus,  albuterol, Lipitor.   FAMILY HISTORY:  Heart and lung disease and arthritis.   SOCIAL HISTORY:  Single. Does not smoke or drink. Highest grade completed  was eighth.   PHYSICAL EXAMINATION:  GENERAL:  She is a well-developed nurse.  Grooming  and hygiene are normal. She has no deformity. Body habitus is large.  EXTREMITIES:  Her pulses are normal. Extremities are warm without edema or  tenderness. She has no swelling or varicosities.  NECK:  Her lymph nodes and cervical spine are normal.  MUSCULOSKELETAL:  Her musculoskeletal exam shows an antalgic gait favoring  her left lower extremity. Her left knee is tender over the  medial side. She  has a positive external rotation test. She has range of motion of about 110  degrees. Her knee is stable. Strength is normal.  SKIN:  Without scar, rash, or lesion.  NEUROLOGICAL:  Normal.  PSYCHIATRIC:  Normal.   Medical decision making included diagnostic x-rays and MRI.   DIAGNOSES:  Medial meniscal tear, knee pain, possible patellar instability,  and tendinitis.   TREATMENT OPTIONS:  At this time, the patient has opted for arthroscopic  surgery with knowledge of risks and benefits of procedure and has given  informed consent. Risk moderate to high because of surgical procedure.   PLAN:  Plan for arthroscopy, left knee, possible medial meniscectomy,  possible lateral release.   ICD-9 719.46, 717.2, 718.36.     ___________________________________________                                         Vickki Hearing, M.D.   SEH/MEDQ  D:  10/15/2003  T:  10/15/2003  Job:  [id number]

## 2011-05-10 NOTE — Procedures (Signed)
   NAME:  [name] [name], [name] [name]                           ACCOUNT NO.:  [id number]   MEDICAL RECORD NO.:  [id number]                   PATIENT TYPE:  OUT   LOCATION:  RAD                                  FACILITY:  [facility]   PHYSICIAN:  Vida Roller, M.D.                DATE OF BIRTH:  Feb 11, 1936   DATE OF PROCEDURE:  02/28/2003  DATE OF DISCHARGE:                                  ECHOCARDIOGRAM   REFERRING PHYSICIAN:  Angus G. Renard Matter, M.D.   PROCEDURE:  Echocardiogram.   TAPE:  #[id number], tape count 1845 through 2369   REASON FOR PROCEDURE:  This is a 75 year old woman born 1936-10-14  with hypertension, shortness of breath, and a CABG in 2002.  This is an  assessment for LV function.   QUALITY OF STUDY:  Technical quality of this study is poor.  IV contrast was  used.   M-MODE MEASUREMENTS:  1. The aorta is 28 mm.  2. The left atrium is 47 mm, which is enlarged.  3. The septum is 12 mm, which is enlarged.  4. The posterior wall is 11 mm, which is the top end of normal.  5. The diastolic dimension is 44 mm.  6. The systolic dimension is 28 mm.   2-D AND DOPPLER IMAGING:  1. The left ventricle is normal size with normal systolic function.     Diastolic function is beyond the limits of this study.  IV contrast was     used to assess wall motion and there appear to be no significant wall     motion abnormalities.  2. The right ventricle is normal size with normal systolic function.  3. Both atria are enlarged, left greater than right.  There is no evidence     to the limits of the study of an atrioseptal defect.  4. The aortic valve morphologically appears normal with no stenosis or     regurgitation.  5. The mitral valve is not well seen.  There appears to be at least mild     mitral regurgitation.  6. The tricuspid valve is not well seen and there appears to be at least     mild tricuspid regurgitation.  7. The pulmonic valve is not well seen.  8. The pericardial structures  are not well seen.  9. The ascending aorta is not well seen.                                               Vida Roller, M.D.   JH/MEDQ  D:  02/28/2003  T:  02/28/2003  Job:  [id number]

## 2011-05-10 NOTE — Discharge Summary (Signed)
NAME:  LAURELL, COALSON                           ACCOUNT NO.:  [id number]   MEDICAL RECORD NO.:  [id number]                   PATIENT TYPE:  INP   LOCATION:  3743                                 FACILITY:  [facility]   PHYSICIAN:  Cornell Barman, P.A. [facility]            DATE OF BIRTH:  Dec 01, 1936   DATE OF ADMISSION:  01/15/2003  DATE OF DISCHARGE:  01/19/2003                                 DISCHARGE SUMMARY   DISCHARGE DIAGNOSES:  1. Chest pain.  2. Positive cardiac enzymes.  3. Two-vessel coronary artery disease.  4. Viral tracheobronchitis.   BRIEF ADMISSION HISTORY:  [name] is a 75 year old female, who was  transferred from [facility] secondary to unrelenting chest pain and  significant positive troponins.  The patient has known coronary artery  disease and is status post CABG in 2001.  One year after her bypass surgery,  she presented with recurrent chest pain.  At that time, her troponins were  elevated at 0.4.  Follow-up cardiac catheterization revealed patent grafts;  however, there was a portion of circulation of the LAD proximal to the  diagonal that was stenosed and proximal to the LIMA insertion.  It was not  felt to be amenable to PCI.  On presentation, the patient also complained of  progressing myalgias, fevers, chills, and dry cough.  She presented with a  temperature of 100.7 and was started on antibiotics for presumed pneumonia.   PAST MEDICAL HISTORY:  1. Coronary artery disease, status post CABG in 2001, with stent restenosis.  2. Adult onset diabetes mellitus, insulin requiring.  3. Hypertension.  4. Hypothyroidism.  5. History of CVA.  6. ACE INTOLERANCE.  7. Status post total abdominal hysterectomy.  8. Status post appendectomy.  9. History of rotator cuff repair.  10.      Status post back surgery x 2.  11.      Dyslipidemia.  12.      Morbid obesity with questionable sleep apnea.   HOSPITAL COURSE:  #1 - CARDIOVASCULAR:  The patient was  admitted with chest  pain and positive troponins.  She underwent cardiac catheterization on  01/15/03.  This revealed two-vessel coronary artery disease with well-  preserved LV function.  She appeared to have a well-revascularized LAD.  The  intermedius did not appear to have advanced significantly from her previous  cath.  The right coronary artery and left circumflex were large caliber  vessels with only mild disease.  At that point, medical therapy was  recommended.   #2 - PULMONARY:  The patient presented with bronchopneumonia, probable  viral; however, she was started on empiric Zithromax.  She was also treated  with a course of steroids which she is being tapered off of.  She has  symptomatically improved.  She may have underlying COPD versus restrictive  lung disease that needs to be further evaluated as an outpatient with PFT's.   #  3 - ADULT ONSET DIABETES MELLITUS:  This is poorly controlled with a  hemoglobin A1c of 7.3%.  This was also aggravated by IV steroids.  However,  we did not make any significant changes in her regimen and will instead  defer to her primary care physician.   LABORATORY DATA AT DISCHARGE:  Urinalysis was negative.  Hemoglobin 12.1.  Coags were normal.  D-dimer was normal.  Hemoglobin A1c was 7.3%.  Troponin  was 0.5 and 0.52.  BNP was 389.  Total cholesterol was 136, triglycerides  163, HDL 49, LDL 54.  TSH was 0.185.  A CT of the chest showed no evidence  of pulmonary embolus with small bilateral pleural effusions and compressive  atelectasis.   MEDICATIONS AT DISCHARGE:  1. Metoprolol 25 mg 1/2 tab b.i.d.  2. Aspirin 81 mg daily.  3. Synthroid 25 mcg daily.  4. Lasix 40 mg daily.  5. Lantus 40 units nightly.  6. Lipitor 20 mg daily.  7. Actos 45 mg daily.  8. Prednisone 20 mg daily x 3 additional days.  9. Albuterol MDI 2 puffs q.4-6h. p.r.n.  10.      Cozaar as at home.   FOLLOW UP:  With Angus G. McInnis, M.D. in 1-2 weeks.                                                Cornell Barman, P.A. [facility]    LC/MEDQ  D:  01/19/2003  T:  01/19/2003  Job:  [id number]   cc:   Angus G. Renard Matter, M.D.  [address]  [place]  [place] [address]  Fax: [phone]   Duke Salvia, M.D. [facility]

## 2011-05-10 NOTE — H&P (Signed)
NAME:  [name] [name], [name] [name]              ACCOUNT NO.:  [id number]   MEDICAL RECORD NO.:  [id number]          PATIENT TYPE:  INP   LOCATION:  A211                          FACILITY:  [facility]   PHYSICIAN:  Angus G. Renard Matter, MD   DATE OF BIRTH:  03-14-1936   DATE OF ADMISSION:  02/18/2007  DATE OF DISCHARGE:  LH                              HISTORY & PHYSICAL   A 75 year old white female who presented to the ED with chief complaint  being increased cough and wheezing over the past few days.  The patient  has known chronic obstructive pulmonary disease, was examined by ED  physician, felt to have exacerbation of chronic obstructive pulmonary  disease and bronchitis.  She received albuterol/Atrovent nebulizer  treatment in the ED and started on intravenous steroids.  Her O2 SATs  remained in the 90s, but she continued to have difficulty with wheezing  and shortness of breath, and it was felt that she should be admitted.  The patient did have a chest x-ray which showed evidence of chronic  bronchitis but no evidence of pneumonia.  The patient was subsequently  admitted.   SOCIAL HISTORY:  The patient does not smoke cigarettes, minimal alcohol  consumption.   FAMILY HISTORY:  See previous record.   PAST MEDICAL HISTORY:  The patient has a history of insulin-dependent  diabetes, previous TIA.  She has history of COPD and bronchitis,  hypertension, coronary artery disease with previous CABG, history of  CHF, dyslipidemia.   ALLERGIES:  TO CODEINE.   REVIEW OF SYSTEMS:  HEENT:  Negative.  CARDIOPULMONARY:  The patient had increased cough and dyspnea over the  past few days.  GI:  No bowel irregularity or bleeding.  GU:  No dysuria or hematuria.   EXAMINATION:  GENERAL:  Uncomfortable white female with blood pressure  149/68, respiration 22, pulse 90, temperature 98, O2 sat 96.  HEENT:  Eyes:  PERRLA.  TM negative.  NECK:  Supple.  No JVD or thyroid abnormalities.  HEART:  Regular rhythm, no  murmurs.  No cardiomegaly.  LUNGS:  Rhonchi scattered both throughout both lung fields.  ABDOMEN:  No palpable organs or masses.  No organomegaly.  EXTREMITIES:  Free of edema.  NEUROLOGIC:  No focal deficit.   DIAGNOSES:  Exacerbation of chronic obstructive pulmonary disease and  bronchitis, insulin-dependent diabetes, history of coronary artery  disease, congestive heart failure.   PLAN:  To continue nebulizer treatments, IV antibiotics, steroids,  continue to monitor closely her blood sugars.   MEDICATION LIST:  1. Lantus insulin, 40 units in the morning, 45 units each p.m.  2. Synthroid 0.25 mg daily.  3. Lipitor 20 mg daily.  4. Cozaar 100 mg daily.  5. Aspirin 81 mg daily.  6. Metformin 500 mg b.i.d.      Angus G. Renard Matter, MD  Electronically Signed     AGM/MEDQ  D:  02/18/2007  T:  02/18/2007  Job:  [id number]

## 2011-05-10 NOTE — Op Note (Signed)
NAME:  [name] [name], [name] [name]                           ACCOUNT NO.:  [id number]   MEDICAL RECORD NO.:  [id number]                   PATIENT TYPE:  AMB   LOCATION:  DAY                                  FACILITY:  [facility]   PHYSICIAN:  Vickki Hearing, M.D.           DATE OF BIRTH:  Apr 05, 1936   DATE OF PROCEDURE:  10/18/2003  DATE OF DISCHARGE:                                 OPERATIVE REPORT   PREOPERATIVE DIAGNOSIS:  Medial meniscal tear, left knee.   POSTOPERATIVE DIAGNOSIS:  Medial plica, left knee.   PROCEDURE:  Synovectomy, left knee.   SURGEON:  Vickki Hearing, M.D.   ANESTHESIA:  Endotracheal intubation with general anesthesia.   FINDINGS:  Large patellofemoral plica extending from the medial compartment  across into the notch and anterior compartment rubbing across the medial  femoral condyle.  The remaining portions of the knee were in very good  shape.   DESCRIPTION OF PROCEDURE:  The patient was identified as [name] [name] in  the preoperative holding area.  She was given IV antibiotics.  Her left knee  was previously marked by the patient as the surgical site.  This was  confirmed using the medical record and consent form, and my initials were  placed over the left knee.  After this, we took her to the operating room  where she had general anesthesia.  Her left knee was prepped and draped  using sterile technique, and a time out was taken.  All parties present  agreed that the patient was [name] [name] and she was to have an arthroscopy  of the left knee with the diagnosis of a medial meniscal tear.   After finishing the sterile prep and drape, the diagnostic arthroscopy was  performed.  The medial portal was used to probe the joint structures.  The  medial meniscus was frayed on its free edge but not torn, and it was stable.  The free edge frayed areas were debrided.  The major finding was a large  medial plica extending from the medial compartment blocking its  access and  extending across to the inferior pole of the patella and across the notch.  This was debrided and resected.  The knee was irrigated and suctioned free  of all debris.  It was washed with the pump system, and 30 cc of 0.5% plain  Marcaine was injected.  Steri-Strips were placed over the portal sites.  Sterile dressing was applied along with a CryoCuff and Ace bandage. The  patient was sent to the recovery room in stable condition.   The patient had some difficulty ambulating with crutches and ambulating with  a walker.  We have therefore ordered a platform walker and a knee  immobilizer, and she can use a cane with the knee immobilizer or the  platform walker to weightbear as tolerated.  She should be fine to do that.  ___________________________________________                                            Vickki Hearing, M.D.   SEH/MEDQ  D:  10/18/2003  T:  10/18/2003  Job:  [id number]

## 2011-05-10 NOTE — Discharge Summary (Signed)
[place]  Patient:    [name] [name], [name] [name]                          MRN: [id number] Adm. Date:  04/29/01 Disc. Date: 05/01/01 Attending:  Veneda Melter, M.D. Dictator:   Tereso Newcomer, P.A. CC:         Veneda Melter, M.D. Office in [place] and Office in [place], M.D.   Discharge Summary  DATE OF BIRTH:  1936/07/18  REASON FOR ADMISSION:  Acute coronary syndrome.  DISCHARGE DIAGNOSES: 1. Single-vessel coronary artery disease. 2. Status post coronary artery bypass graft x 2 with left internal mammary    artery to left anterior descending artery and saphenous vein graft to    diagonal-2. 3. Diabetes mellitus, type 2. 4. Hyperlipidemia. 5. Hypertension. 6. Hypothyroidism. 7. ACE intolerance due to cough. 8. History of cerebrovascular accident.  PROCEDURES:  Cardiac catheterization by Dr. Veneda Melter on Apr 30, 2001, revealing left main mild, LAD 80% mid at diagonal-2 (at stent site), 80% diagonal-2 distal via LIMA, diagonal-1 via SVG.  Left circumflex large OM mild disease.  Right iliac 50% proximal.  RCA dominant.  PDA and posterior ventricular 30% proximal.  Left ventriculogram revealed normal LV systolic function, EF greater than 65%, no MR.  HOSPITAL COURSE:  This 75 year old female was originally seen in [facility] in consultation with recurrent chest pressure, worse with exertion and mildly relieved with nitroglycerin.  Her symptoms were similar to previous presentations.  Two weeks prior to admission, she developed mild chest pressure with occasional radiation to her throat, mild shortness of breath but no radiation to her arms.  She also noted mildly increased dyspnea on exertion but no PND or pedal edema.  She notes her chest pain with rest, but it is worse with activity.  She had an outpatient evaluation by her primary care physician on the Friday before admission.  She called back to  the office and was admitted directly to the hospital.  Her CK-MB was negative x 3, and her troponin I was increased at 0.3 x 3.  She was placed on IV nitroglycerin and heparin and had no further chest pain.  Her initial physical exam was notable for blood pressure 127/69, pulse 64, respirations 20, temperature 97.3.  Telemetry revealed normal sinus rhythm with heart rate 60s.  Her neck was without bruits; lungs clear to auscultation; heart regular rate and rhythm without S4 or murmurs.  Abdomen had positive bowel sounds.  Extremities without pedal edema.  EKG showed normal sinus rhythm, left axis deviation, nonspecific ST-T wave changes.  The patient was subsequently transferred to [facility] for further evaluation with cardiac catheterization.  She was originally seen by Dr. Glennon Hamilton at [facility].  Dr. Chales Abrahams received in transfer.  Dr. Chales Abrahams performed the cardiac catheterization on Apr 30, 2001.  The results are noted above.  The patient tolerated the procedure well and had no immediate complications.  On the morning of May 10, she was doing well without recurrent chest pain or shortness of breath.  Her Cozaar was increased to 100 mg a day.  Toprol was added at 25 mg a day, and her regular home medicines were continued.  She would follow up with Dr. Daleen Squibb in the next two weeks, and she would see Dr. Frederic Jericho next week, and she should call for an appointment.  The patient is experiencing a lot of  financial difficulties, and we are trying to set her up with assistance for her medications.  LABORATORY DATA:  White blood cell count 8200, hemoglobin 13.5, hematocrit 39.5, platelet count 224,000.  Sodium 139, potassium 3.8, chloride 104, CO2 30, glucose 151, BUN 11, creatinine 0.7, alkaline phosphatase 67, SGOT 20, SGPT 24, total protein 6.0, albumin 3.5, calcium 9.0.  Cardiac enzymes: Total CK #1, 76; #2, 70; #3, 67.  CK-MB 0.8; #2, 3.0; #3, 1.0.  Troponin I 0.32; #2, 0.35; #3,  0.35.  TSH 3.614.  Chest x-ray on May 8: Atelectasis left upper lobe, no focal consolidation or edema.  DISCHARGE MEDICATIONS: 1. Cozaar 100 mg q.d. 2. Toprol XL 25 mg q.d. 3. Avandia 4 mg q.d. 4. Synthroid 0.025 mg q.d. 5. Lipitor 20 mg q.h.s. 6. Premarin 0.625 mg q.d. 7. Aspirin 325 mg q.d. 8. Lantus insulin 40 units q.h.s. 9. Nitroglycerin as needed for chest pain.  ACTIVITY:  No driving, heavy lifting, or exertion for one week.  No work until seen by Dr. Daleen Squibb again.  DIET:  Low-fat, low-salt, diabetic diet.  WOUND CARE:  The patient should call for any concerns or recurrence of bleeding or bruising.  She is to see Dr. Daleen Squibb on May 24 at 10:45 a.m. in [place].  She should see Dr. Frederic Jericho in one week and should call for an appointment. DD:  05/01/01 TD:  05/01/01 Job: [id number]

## 2011-05-10 NOTE — Group Therapy Note (Signed)
NAME:  [name] [name], [name] [name]              ACCOUNT NO.:  [id number]   MEDICAL RECORD NO.:  [id number]          PATIENT TYPE:  INP   LOCATION:  A211                          FACILITY:  [facility]   PHYSICIAN:  Catalina Pizza, M.D.        DATE OF BIRTH:  1936/12/17   DATE OF PROCEDURE:  02/21/2007  DATE OF DISCHARGE:                                 PROGRESS NOTE   [name] was admitted to the hospital due to respiratory distress  secondary to COPD and bronchitis exacerbation.  She does have medical  history including insulin-dependent diabetes, coronary artery disease,  history of congestive heart failure.  She states that her breathing has  improved since admission but still does feel very tight and wheezing.  Other issue is that her blood sugars have been extensively running up  since being on steroids.  She states she does have mild headache but no  other specific complaints at this time.   OBJECTIVE:  VITAL SIGNS:  Temperature is 97.1, blood pressure 131/66,  pulse 82, respirations 20, saturating 94% on room air, 100% on 2 L.  CBGs have been 415, 457, 356, respectively.  GENERAL:  This is an obese white female sitting on the side of the bed  in no acute distress.  HEENT:  Unremarkable.  HEART:  Regular rate and rhythm.  No murmurs, gallops or rubs  appreciated.  LUNGS:  Lungs continue to have poor air movement throughout.  Does have  some scattered end-expiratory wheezes heard throughout.  No rhonchi or  crackles appreciated.  ABDOMEN:  Soft, nontender, nondistended, positive bowel sounds.  EXTREMITIES:  Trace lower extremity edema.  NEUROLOGIC:  Alert and oriented x3.   No lab work or images obtained.   IMPRESSION:  [name] with chronic obstructive  pulmonary disease and bronchitis exacerbation, admitted for shortness of  breath and further treatment.   ASSESSMENT AND PLAN:  1. Chronic obstructive pulmonary disease and bronchitis:  Continue      with  antibiotics as well as nebulizer treatments and steroids.  2. Insulin-dependent diabetes:  Will increase her Lantus insulin to 60      units b.i.d.  Considering her significant elevation, will continue      on the resistant insulin sliding scale as well.  We will decrease      her steroids mildly and see if this does improve.  3. Coronary artery disease and congestive heart failure:  She does not      have any apparent signs of this at this time.  Will continue with      her current medications.  4. Hypothyroidism:  She is to continue on the levothyroxine 25 mcg      daily.  Unclear the last time she had a TSH or free T4 checked.   DISPOSITION:  The patient will need continued treatment with oxygen as  well as steroids and breathing treatments.      Catalina Pizza, M.D.  Electronically Signed     ZH/MEDQ  D:  02/21/2007  T:  02/21/2007  Job:  [id number]

## 2011-05-10 NOTE — Discharge Summary (Signed)
[place]. [facility]  Patient:    [name] [name]                         MRN: [id number] Adm. Date:  [id number] Disc. Date: [id number] Attending:  Mirian Mo Dictator:   Lavella Hammock, P.A.-C. CC:         Butch Penny, M.D. - [place], [place]                           Discharge Summary  DATE OF BIRTH:  [birth year]  PROCEDURE: 1. Cardiac catheterization. 2. Coronary arteriogram. 3. Left ventriculogram. 4. Percutaneous transluminal coronary angioplasty and stent of two vessels.  HISTORY OF PRESENT ILLNESS:  [name] is a 75 year old female with a history f a percutaneous transluminal coronary angioplasty and HSRA to the LAD and diagonal in November 2000, who came to the hospital with chest pain.  She states that the chest pain episodes began three days ago, and she has been using nitroglycerin regularly. She was having chest pain that was unrelieved by nitroglycerin sublingual, morphine, and Lopressor IV.  HOSPITAL COURSE:  She was taken urgently to the cardiac catheterization laboratory. There she was catheterized and had mild left main disease in the LAD, with a 90% proximal stenosis, encroaching D-II with a 60% stenosis in D-II, with a trivial  D-I.  The circumflex was large with mild disease in the RCA, was dominant, with  only mild disease.  Her ejection fraction was greater than 55% with no mitral regurgitation.  She had a PTCA and stent to the LAD, reducing the stenosis from 90% to 0%, and a PTCA and stent to the second diagonal, reducing the stenosis from 0% to 10%.  Perclose was used.  She tolerated the procedure well and the sheath was removed without difficulty.  The next day she had a small amount of blood on the Perclose dressing, but there was no hematoma, no bruits noted.  Distal pulses were 2+.  She was ambulatory without difficulty.  She had complained of a urinary tract infection symptoms and her family  physician, whom she saw prior to admission, had diagnosed a urinary tract infection, but had not had a chance to start treatment, so she was covered with Cipro for that.  She also complained of upper respiratory congestion and cough, although she denied a fever.  Her white count was within normal limits.  Her chest x-ray showed no acute disease.  She was told to continue using the Claritin and Robitussin that she had at home, and to follow up with her family physician if the symptoms worsened.  DISPOSITION:  She was considered stable for discharge on January 18, 2000.  LABORATORY DATA:  Sodium 138, potassium 3.5, chloride 106, CO2 of 26, BUN 10, creatinine 0.6, glucose 147.  CPK-MB post-procedure negative.  Hemoglobin 13, hematocrit 36.6, wbcs 6.4, platelets 181.  Chest x-ray:  No acute disease, no lesions.  Electrocardiogram:  A sinus rhythm with left axis deviation and possible old anterior myocardial infarction.  CONDITION ON DISCHARGE:  Improved.  CONSULTATION:  None.  COMPLICATIONS:  None.  DISCHARGE DIAGNOSES:  1. Single-vessel coronary artery disease, status post percutaneous transluminal     coronary angioplasty and stent to the left anterior descending coronary artery     and diagonal-II this admission.  2. Status post high-speed rotational atherectomy/percutaneous transluminal     coronary angioplasty of the left  anterior descending coronary artery and     diagonal in November 12, 1999.  3. Status post high-speed rotational atherectomy/percutaneous transluminal     coronary angioplasty of diagonal-II and percutaneous transluminal coronary     angioplasty and stent of the distal left anterior descending coronary artery     in May 2000.  4. Insulin-dependent diabetes mellitus.  5. Hypertension.  6. Hyperlipidemia.  7. Family history of coronary artery disease.  8. History of a cerebrovascular accident.  9. History of hypothyroidism. 10. History of a hiatal  hernia. 11. Status post lumbar disk surgery, cholecystectomy, and a right hernia     repair.  DISCHARGE INSTRUCTIONS: 1. No driving, no strenuous activity for two days. 2. She is to stick to a low-fat, diabetic diet. 3. She is to call the office for bleeding, swelling, or drainage from the    catheterization site. 4. She is to keep her February 11, 2000, appointment with  Dr. Jesse Sans. Wall. 5. She is to follow up with Dr. Butch Penny as needed.  DISCHARGE MEDICATIONS:  1. Toprol XL 50 mg q.d.  2. Coated aspirin 325 mg q.d.  3. Premarin 0.625 mg q.d.  4. Synthroid 0.025 mg q.d.  5. Lipitor 10 mg q.d.  6. Insulin 70/30, 50 units q.a.m.  7. Cozaar 50 mg q.d.  8. Avandia 4 mg q.d.  9. Nitroglycerin 0.4 mg sublingual p.r.n. 10. Cipro 250 mg b.i.d. for seven days. 11. Plavix 75 mg q.d. x 1 month.DD:  01/18/00 TD:  01/18/00 Job: [id number]

## 2011-05-10 NOTE — Op Note (Signed)
NAME:  [name] [name], [name] [name]                           ACCOUNT NO.:  [id number]   MEDICAL RECORD NO.:  [id number]                   PATIENT TYPE:  AMB   LOCATION:  DAY                                  FACILITY:  [facility]   PHYSICIAN:  Lionel December, M.D.                 DATE OF BIRTH:  1936/09/19   DATE OF PROCEDURE:  DATE OF DISCHARGE:                                 OPERATIVE REPORT   PROCEDURE:  Total colonoscopy.   INDICATIONS:  The patient is a 75 year old Caucasian female with a history  of colonic polyps, who is long overdue for surveillance colonoscopy.  She  does not have any symptoms other than constipation.  It is felt that this  may be partly due to her medications.  The procedure was reviewed with the  patient, and informed consent was obtained.   PREMEDICATION:  Demerol 50 mg IV, Versed 5 mg IV in divided dose.   INSTRUMENT USED:  Olympus video system.   FINDINGS:  Procedure performed in endoscopy suite.  The patient's vital  signs and O2 saturation were monitored during procedure and remained stable.  The patient was placed in the left lateral position and rectal examination  performed.  This was within normal limits.  The scope was placed in the  rectum and advanced under vision to the sigmoid colon, where she had a lot  of formed stool.  Some of it I was able to break with the jet of water, but  other ones failed to move, push up or down.  I was able to pass the scope  beyond into the descending colon.  Preparation of the rest of the colon was  normal, except she had some stool at the cecum.  The cecal landmarks were  identified and photographed for the record.  As the scope was withdrawn, the  mucosa was once again carefully examined.  There were no polyps, tumor  masses, or diverticular changes.  Rectal mucosa was normal.  The scope was  retroflexed to examine the anorectal junction, and hemorrhoids were noted  below the dentate line.  The endoscope was straightened and  withdrawn.  The  patient tolerated the procedure well.   FINAL DIAGNOSES:  1. Examination performed to the cecum.  2. Examination of the sigmoid colon somewhat compromised because of quality     of prep.  3. No evidence of recurrent polyps.  4. Small internal hemorrhoids.   RECOMMENDATIONS:  1. A high-fiber diet.  2. Benefiber 4 g daily.  3.     Lactulose two tablespoonfuls at bedtime, prescription given for one quad     with refills for up to a year.  She is also asked to watch her blood     glucose levels and if they start running higher than her usual, she will     need to stop lactulose and let us known.  Lionel December, M.D.    NR/MEDQ  D:  11/03/2002  T:  11/03/2002  Job:  [id number]   cc:   Angus G. Renard Matter, M.D.

## 2011-05-10 NOTE — Procedures (Signed)
NAME:  [name] [name], [name] [name]              ACCOUNT NO.:  [id number]   MEDICAL RECORD NO.:  [id number]          PATIENT TYPE:  OUT   LOCATION:  RAD                           FACILITY:  [facility]   PHYSICIAN:  Vida Roller, M.D.   DATE OF BIRTH:  08/31/1936   DATE OF PROCEDURE:  02/04/2006  DATE OF DISCHARGE:                                    STRESS TEST   SUPERVISING PHYSICIAN/CARDIOLOGIST:  Vida Roller, M.D.   PRIMARY CARE PHYSICIAN:  Angus G. Renard Matter, M.D.   HISTORY:  [name] is a 75 year old white female who was seen by Dr.  Dorethea Clan in the Cardiology office yesterday on February 03, 2006, for  reoccurring exertional chest discomfort.  Her medical history is notable for  hypertension, hyperlipidemia, diabetes, obesity, hypothyroidism, status post  two vessel bypass surgery with a LIMA to the LAD and saphenous vein graft to  the diagonal.  Last cardiac catheterization was in 2004 which showed an  ejection fraction of greater than 55%.  The saphenous vein graft to the  second diagonal and then LIMA to the LAD were patent.  She had  nonobstructive coronary artery disease in her ramus, RCA and circumflex at  that time.   Baseline EKG showed normal sinus rhythm, delayed R wave, left axis  deviation, blood pressure was 130/68.  Utilizing the Bruce protocol, the  patient ambulated a total exercise time of 5 minutes and 16 seconds to a  maximum heart rate of 138 which was 93% of her predicted maximum heart rate.  Maximum blood pressure as 188/62.  Total mets achieved was 7.0.  Test was  ended secondary to symptoms of chest tightness and shortness of breath, and  the patient stated that she could no longer walk on the treadmill.  Immediately, post cessation of treadmill, the patient became dizzy,  nauseated and her baseline headache became worse.  She stated that her  glucose this morning prior to her arrival to the hospital was 103.  She has  not eaten anything since yesterday, and she did  take her Lantus last night.  She received some graham crackers and juice.  Glucometer was checked, and  her glucose was 113.  Her symptoms had improved after the crackers and  juice.  She did still have some slight residual chest tightness, but EKG and  blood pressure remained stable.  Final images and final results are pending  physicians review.      Joellyn Rued, P.A. [facility]      Vida Roller, M.D.  Electronically Signed    EW/MEDQ  D:  02/04/2006  T:  02/05/2006  Job:  [id number]

## 2011-05-10 NOTE — H&P (Signed)
NAME:  [name] [name], [name] [name]                           ACCOUNT NO.:  [id number]   MEDICAL RECORD NO.:  [id number]                   PATIENT TYPE:  INP   LOCATION:  A337                                 FACILITY:  [facility]   PHYSICIAN:  Hanley Hays. Dechurch, M.D.           DATE OF BIRTH:  10-Aug-1936   DATE OF ADMISSION:  01/12/2003  DATE OF DISCHARGE:                                HISTORY & PHYSICAL   HISTORY OF PRESENT ILLNESS:  The patient is a 75 year old white female with  diabetes mellitus, hypertension, coronary artery disease with a two-day  history of headache, myalgias, nausea and vomiting who was unable to  maintain p.o.  She has had a frequent hacking cough, which is generally  nonproductive, and general malaise.  She states she is not able to get up  and about on her own because she feels so bad.  She also notes increased  shortness of breath over the past several days preceding the onset of the  illness.  Her son is currently hospitalized with the same.  A chest x-ray  reveals an early left lingular possible upper lobe infiltrate.  She is  mildly febrile with normal white count.  She is being admitted to the  hospital for IV antibiotics, IV therapy, and evaluation as indicated.   PAST MEDICAL HISTORY:  1. As noted above, remarkable for coronary artery disease status post bypass     grafting in 2001 and stent restenosis.  2. Diabetes mellitus, control unknown.  3. Hypertension.  4. Hypothyroidism.  5. History of CVA.  6. ACE intolerance (cough).  7. Status post total abdominal hysterectomy.  8. Status post appendectomy.  9. Rotator cuff repair.  10.      Back surgery x2.   SOCIAL HISTORY:  She is widowed.  She lives independently.  No alcohol or  tobacco abuse ever though she was exposed to secondhand smoke during her  marriage.   FAMILY HISTORY:  Noncontributory except as noted above.   REVIEW OF SYSTEMS:  The patient is usually independent.  No GI or GU  complaints.   Glucose tends to be in the 100-200s.  No hyperglycemia.  Weight  has been no change.  No CHF, no edema.  She has a history of bronchitis a  year ago, otherwise negative.  She did receive a flu vaccine in October.   PHYSICAL EXAMINATION:  VITAL SIGNS:  Blood pressure 150/87, pulse 100,  temperature 100.7, respirations 22-24, saturation is 97% on room air.  Positive dyspnea on exertion even moving about on the stretcher.  She has a  frequent hacking cough.  HEENT:  Oropharynx is dry.  GENERAL:  Alert and oriented, appropriate, nonfocal.  LUNGS:  Diminished, most likely secondary to habitus.  She does have some  inspiratory wheezing bilaterally.  HEART:  Regular.  Distant.  ABDOMEN:  Obese and soft.  EXTREMITIES:  No edema is noted.  SKIN:  Dry.  No rash.   Exam overall is limited by her habitus.   ASSESSMENT AND PLAN:  1. Bronchopneumonia, most likely viral syndrome with a possible early     infiltrate.  Empiric antibiotics were started in the emergency room, as     well as nebulizer treatment.  She did receive some prednisone in the     emergency room and we will continue intravenous Solu-Medrol for a short     course on the floor, given her significant bronchospasm and shortness of     breath.  She has received azithromycin and Rocephin, which will be     continued.  2. Diabetes mellitus.  Control is unknown.  Question angiotensin receptor     blocker, as she does not tolerate angioconverting enzyme inhibitors for     renal protective effects.  3. Coronary artery disease, stable at present.  Continue the aspirin.  She     is currently not on beta-blocker.  4. History of hypertension.  She is hypertensive in the emergency room,     again not on any medication.  It would be reasonable to consider adding     to her regimen.  5. Nausea and vomiting, most likely due to the primary problem.  Check liver     profile and lipase.  Phenergan has been ordered.  If persists, consider      further treatment but for now supportive therapy.                                               Hanley Hays Josefine Class, M.D.    FED/MEDQ  D:  01/13/2003  T:  01/13/2003  Job:  [id number]

## 2011-05-10 NOTE — Cardiovascular Report (Signed)
[place]. [facility]  Patient:    [name] [name], [name] [name]                        MRN: [id number] Proc. Date: 04/30/01 Adm. Date:  [id number] Attending:  Veneda Melter CC:         Jesse Sans. Wall, M.D. [facility]  Butch Penny, M.D.   Cardiac Catheterization  PROCEDURES PERFORMED: 1. Left heart catheterization. 2. Left ventriculogram. 3. Selective coronary angiography. 4. Selective angiography, saphenous vein graft and internal mammary bypass    graft.  DIAGNOSES: 1. Native single vessel coronary artery disease. 2. Patent saphenous vein and internal mammary bypass grafts. 3. Normal left ventricular systolic function.  INDICATIONS:  [name] is a 75 year old white female, with a known history of coronary artery disease who has undergone percutaneous intervention to the mid LAD with stenting over a bifurcation lesion involving the second diagonal branch.  She has had recurrent re-stenosis and subsequently underwent coronary artery bypass graft surgery in July of 2001 with placement of LIMA grafts to the distal LAD and a vein graft to the second diagonal branch.  She presents now with recurrent substernal chest discomfort.  TECHNIQUE:  After informed consent was obtained, the patient was brought to the cardiac catheterization lab where a 6 French sheath was placed in the right femoral artery.  Left heart catheterization and selective angiography were then performed in the usual fashion using preformed 6 French Judkins catheters.  At the termination of the case, the catheters and sheath were removed and manual pressure applied until hemostasis was achieved.  The patient tolerated the procedure well and was transferred to the ward in stable condition.  FINDINGS:  Findings are as follows: 1. Left main trunk:  The left main trunk is a medium caliber vessel with mild    irregularities. 2. Left anterior descending:  This is a medium caliber vessel that provides a  trivial first diagonal branch in the proximal segment and a second    diagonal branch in the mid section.  The LAD then extends to the apex.    There is a stent within the mid section of the LAD encompassing the first    and second diagonal branches with in-stent re-stenosis of 80%.  A septal    perforator is also noted to arise from within the area of stented segment.    The distal LAD is seen to fill via LIMA graft and has mild irregularities.    The second diagonal branch is a bifurcating vessel.  It is seen to fill via    a saphenous vein graft anastomosed to the lateral division.  There is an    anastomotic lesion of 50%.  Retrograde filling is noted of the mid LAD    within the stented segment with a filling of a septal perforator in the    distal LAD up to the LIMA graft. 3. Left circumflex artery:  The left circumflex artery is a large caliber    vessel that consists of a large marginal branch in the mid section.  The    left circumflex system has mild irregularities. 4. Ramus intermedius:  This is a small caliber vessel with tubular narrowing    of 50% in the proximal segment. 5. Right coronary artery:  The right coronary artery is a medium caliber    vessel that provides the posterior descending artery and the posterior    ventricular branch in its terminal segment.  There are mild irregularities    of 30% in the proximal vessel. 6. LIMA to the LAD:  Patent. 7. Saphenous vein graft to the second diagonal branch of the LAD:  Patent.    There is an anastomotic lesion of 50% as previously noted.  LEFT VENTRICULOGRAM:  Normal end-systolic and end-diastolic dimensions. Overall left ventricular function is well preserved, ejection fraction of greater than 65%.  No mitral regurgitation.  LV pressure is 126/5.  Aortic is 126/62.  LVEDP equals 19.  ASSESSMENT AND PLAN:  [name] is a 75 year old female, with severe single vessel coronary artery disease.  The patient has small vessel  disease involving the mid left anterior descending with possible compromise of flow to a trivial first diagonal branch and a septal perforator.  This is within the previously stented segment of diffuse re-stenosis.  It is unclear that percutaneous intervention to this segment would improve flow to the diagonal branch or the septal perforator and thus continued medical therapy will be recommended. DD:  04/30/01 TD:  04/30/01 Job: [id number]

## 2011-05-10 NOTE — Group Therapy Note (Signed)
NAME:  [name] [name], [name] [name]              ACCOUNT NO.:  [id number]   MEDICAL RECORD NO.:  [id number]          PATIENT TYPE:  INP   LOCATION:  A204                          FACILITY:  [facility]   PHYSICIAN:  Angus G. Renard Matter, MD   DATE OF BIRTH:  Dec 15, 1936   DATE OF PROCEDURE:  DATE OF DISCHARGE:                                   PROGRESS NOTE   This patient was admitted with increased cough and dyspnea.  She does have  insulin-dependent diabetes and her sugars were extremely high yesterday, in  the 500 range, and she had to be placed on the Glucommander with IV insulin  for a period of time.  Her sugars have come down some now, and the patient  has been placed back on sliding scale Humalog insulin and Lantus insulin  b.i.d. Her sugars range from 156-273.   OBJECTIVE:  VITAL SIGNS:  Blood pressure 128/67, respiration 20, pulse 92,  temp 98.4.  The patient has had insomnia and headache, and continues with a productive  cough.  LUNGS:  Rhonchi and scattered wheezes over the lower lung field.  HEART:  Regular rhythm.  ABDOMEN:  No palpable organs or masses.  EXTREMITIES:  Free of edema.   ASSESSMENT:  The patient was admitted with exacerbation of chronic  obstructive pulmonary disease and bronchitis.  She has been treated for  hyperglycemia and poorly controlled diabetes.   PLAN:  To continue current monitoring of blood sugars, continue sliding  scale insulin along with Lantus insulin, continue current antibiotic regimen  and nebulizer treatments.      Angus G. Renard Matter, MD  Electronically Signed     AGM/MEDQ  D:  09/12/2006  T:  09/14/2006  Job:  [id number]

## 2011-05-10 NOTE — Group Therapy Note (Signed)
NAME:  [name] [name], [name] [name]              ACCOUNT NO.:  [id number]   MEDICAL RECORD NO.:  [id number]          PATIENT TYPE:  INP   LOCATION:  A211                          FACILITY:  [facility]   PHYSICIAN:  Angus G. Renard Matter, MD   DATE OF BIRTH:  1936/10/26   DATE OF PROCEDURE:  DATE OF DISCHARGE:                                 PROGRESS NOTE   This patient was admitted to the hospital with chronic obstructive  pulmonary disease and bronchitis, insulin-dependent diabetes, does have  a history of coronary artery disease, congestive heart failure and  hypothyroidism.   OBJECTIVE:  VITAL SIGNS:  Blood pressure 140/71, respirations 26, pulse  80, temperature 97, sugars have ranged from 229-353.  The patient  continues to cough intermittently.  HEART:  Regular rhythm.  LUNGS:  Diminished breath sounds.  ABDOMEN:  No palpable organs or masses.   ASSESSMENT:  The patient was admitted with exacerbation of chronic  obstructive pulmonary disease.  She does have insulin-dependent diabetes  which has been exacerbated by steroids.  Does have coronary artery  disease and hypothyroidism.   PLAN:  To continue the current regimen.  We will attempt to get patient  discharged within the next day or two if possible.      Angus G. Renard Matter, MD  Electronically Signed     AGM/MEDQ  D:  02/23/2007  T:  02/23/2007  Job:  [id number]

## 2011-05-10 NOTE — Discharge Summary (Signed)
[facility]  Patient:    [name] [name], [name] [name]                        MRN: [id number] Adm. Date:  [id number] Disc. Date: 07/09/00 Attending:  Tressie Stalker Dictator:   Lissa Merlin, P.A.-C. CC:         CVTS office             Jesse Sans. Wall, M.D. [facility]             Butch Penny, M.D.                           Discharge Summary  DATE OF BIRTH:  Jan 25, 1936  CARDIOLOGIST:  Jesse Sans. Wall, M.D.  PRIMARY MEDICAL DOCTOR:  Butch Penny, M.D.  ADMISSION DIAGNOSIS:  Class 4 unstable angina.  PAST MEDICAL HISTORY: 1. Known coronary artery disease with multiple percutaneous interventions. 2. Hypertension. 3. Type 2 diabetes. 4. Hyperlipidemia. 5. History of smoking and positive family history of CAD.  DISCHARGE DIAGNOSES: 1. Severe one vessel coronary artery disease with high grade restenosis at the    site of percutaneous intervention of the left anterior descending. 2. Ruled out for myocardial infarction. 3. Urinary tract infection, currently being treated with Cipro. 4. Status post cardiac catheterization on July 01, 2000. 5. Status post coronary artery bypass grafting x 2 with left internal mammary    artery to distal left anterior descending and saphenous vein graft to    diagonal on July 02, 2000 by Dr. Cornelius Moras. 6. Slow postoperative course secondary to continued O2 dependency. 7. Postoperative deconditioning.  BRIEF HISTORY:  The patient is a 75 year old morbidly obese white female from [place], followed by Butch Penny, M.D. and Jesse Sans. Wall, M.D.  Referred by Arturo Morton. Riley Kill, M.D. for management of coronary disease.  She was admitted to the hospital early Monday morning, June 30, 2000, secondary to substernal chest pain occurring at rest and with minimal physical exertion and associated shortness of breath.  She was ruled out for myocardial infarction via negative cardiac enzymes.  She was put on IV heparin, Integrilin, and nitrates.   She was taken to the cath lab on June 30, 2000, and cathed by Dr. Riley Kill which showed a restenosis of the LAD coronary.  There was also a 70% proximal stenosis of a diagonal branch.  EF was well-preserved with 73% estimated ejection fraction and no significant wall abnormalities.  CVTS consultation was ordered and Dr. Cornelius Moras reviewed the cath data and the patient. He believed that coronary artery bypass grafting was the best treatment for her.  He discussed the indications, risks, and potential benefits of surgery with the patient and answered all her questions.  She understood and it was agreed to proceed with CABG.  On the 11th, the patient was noted to have a fever.  She felt well overall and her WBC remained normal.  She was deemed suitable for surgery.  On July 02, 2000, she underwent CABG x 2 without complication and was transferred to SICU in stable condition.  She was in normal sinus rhythm and there were no inotropic agents or blood products given.  Later that evening, she was waking up and was noted to be doing well and routine care was ordered.  On postoperative day #1, she felt well without complaints.  She was afebrile in normal sinus rhythm.  Vital signs were stable.  Hemoglobin was 11.6.  She was noted to be stable postoperative.  Routine care was continued.  She was later that day transferred to unit 2000.  On postoperative day #2, she appeared tired, but alert and oriented.  She had some nausea attributed to pain medicine.  She was noted to have a urinary tract infection.  This was treated with Cipro which she continues to take. She was being diuresed for volume overload with good urinary output.  She had some atelectasis on her chest x-ray.  She was deemed to be doing well overall. Later that evening, she had some wheezing and nebulizer treatment was began.  By postoperative day #3, it appeared that she was still volume overloaded and O2 dependent.  Nebulizer therapy  was changed to q.i.d.  Over the next two days, the patient continued to progress with ambulation and activity.  The main issue appeared to be continued O2 dependence.  She would desaturate while ambulating with cardiac rehab.  This did not appear to effect her strength and mobility.  She appeared steady.  In other regards, she is doing well, taking POs well.  Her nausea has resolved.  Her rhythm has remained sinus rhythm. Vital signs have remained stable.  She is afebrile.  Her incisions are healing well.  It is felt that she could probably go home tomorrow, the 18th with home oxygen and restorative nursing pending satisfactory morning rounds.  MEDICATIONS:  1. Lasix 40 mg one p.o. q.d. x 10 days.  2. KCl 20 mEq one p.o. q.d. x 10 days.  3. Atrovent MDI two puffs q.i.d.  4. Albuterol MDI two puffs q.i.d.  5. Cipro 500 mg one p.o. b.i.d. x 10 more days.  6. Premarin 0.625 mg one p.o. q.d.  7. Avandia 4 mg one p.o. q.d.  8. Synthroid 25 mcg one p.o. q.d.  9. Enteric-coated aspirin 325 mg one p.o. q.d. 10. Lopressor 50 mg tablet one-half tablet p.o. q.12h. 11. Darvocet-N 100 one to two p.o. q.4-6h. p.r.n. 12. Home oxygen 2 L.  ALLERGIES:  CODEINE.  SPECIAL INSTRUCTIONS:  The patient is advised to avoid strenuous activity.  No heavy lifting over 10 pounds.  She is told to walk daily.  She is told to use her incentive spirometer daily.  She is told that she can shower.  She is told to do no driving.  She is to maintain a low fat, low cholesterol diabetic diet.  She is to keep her wounds clean and dry and use soap and water only. Home health RN will visit her and check on her oxygen and incision.  Home health aid will see her to assist with bathing.  She is advised to get a chest x-ray when she sees Dr. Daleen Squibb in two weeks and to bring it with her when she sees Dr. Cornelius Moras.  FOLLOWUP: 1. The patient is to see Dr. Daleen Squibb in two weeks. 2. She will see Dr. Cornelius Moras Monday, August 04, 2000 at  9:30. DD:  07/08/00 TD:  07/09/00 Job: [id number]  [id number]

## 2011-05-10 NOTE — Op Note (Signed)
[facility]  Patient:    [name] [name], [name] [name]                        MRN: [id number] Proc. Date: 07/02/00 Adm. Date:  [id number] Attending:  Tressie Stalker CC:         Butch Penny, M.D.             Thomas C. Wall, M.D. [facility]             Thomas D. Riley Kill, M.D. [facility]             CVTS office                           Operative Report  PREOPERATIVE DIAGNOSIS:  Severe one-vessel coronary artery disease, with class 4 unstable angina, restenosis status post percutaneous intervention.  POSTOPERATIVE DIAGNOSIS:  Severe one-vessel coronary artery disease, with class 4 unstable angina, restenosis status post percutaneous intervention.  OPERATION PERFORMED:  Median sternotomy for coronary artery bypass grafting x 2 (left internal mammary artery to distal left anterior descending coronary artery, saphenous vein graft to diagonal branch).  SURGEON:  Salvatore Decent. Cornelius Moras, M.D.  ASSISTANT:  Mr. Lissa Merlin, PA  ANESTHESIA:  General.  INDICATIONS FOR PROCEDURE:  The patient is a 75 year old morbidly obese white female from [place] followed by Dr. Butch Penny and Dr. Jesse Sans. Wall, referred by Dr. Bonnee Quin for management of coronary artery disease.  The patient has a history of coronary artery disease and has undergone five cardiac catheterizations and four percutaneous interventions since August of 2000 for severe one-vessel coronary disease.  She additionally had history of hypertension, type 2 diabetes mellitus, hyperlipidemia, and a hiatal hernia. She has a strong family history of coronary artery disease.  She has had numerous percutaneous interventions for high grade stenosis of her left anterior descending coronary artery and the dominant diagonal branch of this vessel.  She now presents with recurrent symptoms of class 4 unstable angina which have persisted in the hospital despite intravenous heparin and Integrelin infusions.  Cardiac catheterization  demonstrates restenosis with greater than 95% stenosis of the left anterior descending coronary artery within the site of the previous intervention.  There was subtotal occlusion of the large diagonal branch.  There is insignificant disease in the left circumflex and right coronary systems.  Left ventricular function appears well-preserved.  OPERATIVE CONSENT:  The patient was counseled at length regarding the indications and potential benefits of coronary artery bypass grafting.  She understands the associated risks of surgery including but not limited to the risks of death, stroke, myocardial infarction, bleeding requiring blood transfusion, arrhythmia, infection and recurrent coronary artery disease.  She accepts these risks as well as any unforeseen complications and agreed to proceed with the surgery as described.  DESCRIPTION OF PROCEDURE:  The patient was brought to the operating room on the above-mentioned date and invasive hemodynamic monitoring was established by the anesthesia service under the care and direction of Dr. Adonis Huguenin.  The patient was placed in the supine position on the operating table.  Following induction of general endotracheal anesthesia, the patients chest, abdomen, both groins and both lower extremities are prepped and draped in sterile manner.  A median sternotomy incision was performed and the left internal mammary artery was dissected from the chest wall and prepared for bypass grafting. The left internal mammary artery was notably good quality conduit for bypass  grafting.  Simultaneously saphenous vein was obtained from the patients right lower leg.  The saphenous vein was notably good quality conduit for bypass grafting.  The patient was heparinized systemically.  The pericardium was opened.  The ascending aorta was normal in appearance. The ascending aorta and the right atrium were cannulated for cardiopulmonary bypass.  The patient has a very  large and fat heart and a very deep chest related to her morbid obesity.  Adequate heparinization was verified.  Cardiopulmonary bypass was begun.  The left anterior descending coronary artery was intramyocardial during two thirds of its course down the anterior wall of the left ventricle.  Distal sites were selected for coronary artery bypass grafting.  The patient has relatively small distal coronary arteries. The saphenous vein and left internal mammary artery were trimmed to appropriate length.  A temperature probe was placed in the left ventricular septum and a styrofoam pad was placed to protect the left phrenic nerve from thermal injury.  A cardioplegia catheter was placed in the ascending aorta.  The patient was cooled to 32 degrees systemic temperature.  The aortic crossclamp was applied and cardioplegia was delivered in an antegrade fashion through the aortic root.  Iced saline slush was applied for topical hypothermia.  Additional doses of cardioplegia were administered down the subsequently placed vein grafts during the crossclamp portion of the operation to maintain septal temperature below 50 degrees centigrade.  The following distal coronary anastomoses were performed:  (1) The second diagonal branch off the left anterior descending coronary artery was grafted with the saphenous vein graft in an end-to-side fashion using running 8-0 Prolene suture.  This coronary measured 1.5 mm at the site of distal bypass but was very delicate.  It was totally occluded proximally.  It was of fair quality at the site of distal bypass.  (2) The distal left anterior descending coronary artery was grafted with the left internal mammary artery using running 8-0 Prolene suture.  This coronary measures 1.3 mm in diameter at the site of distal bypass.  It gets very small quickly as it progresses toward the apex of the heart beyond this site.  It is of good quality but is very delicate at the site  of distal bypass.  The single proximal saphenous vein anastomosis was  performed to the ascending aorta prior to removal of the aortic crossclamp. The septal temperature was noted to rise rapidly and dramatically upon reperfusion of the left internal mammary artery.  The aortic crossclamp was removed after a total crossclamp time of 33 minutes.  The heart was defibrillated into its normal sinus rhythm.  All proximal and distal anastomosis were inspected for hemostasis and appropriate graft orientation. Epicardial pacing wires were fixed to the right ventricular outflow tract and to the right atrial appendage.  The patient was weaned from cardiopulmonary bypass without difficulty.  The patients rhythm at separation from bypass was normal sinus rhythm.  No inotropic support is required.  Total cardiopulmonary bypass time for the operation is 56 minutes.  The venous and arterial cannulae were removed uneventfully.  Protamine was administered to reverse the anticoagulation.  The mediastinum and the left chest were irrigated with saline solution containing vancomycin.  Meticulous surgical hemostasis was ascertained.  The mediastinum and the left chest were drained with three chest tubes placed through separate stab incisions inferiorly.  The median sternotomy was closed in a routine fashion.  The right lower extremity incisions were closed in multiple layers in routine fashion.  All  skin incisions were closed with subcuticular skin closures.  The patient tolerated the procedure well and was transported to the surgical intensive care unit in stable condition.  There were no intraoperative complications.  All sponge, needle and instrument counts were verified correct at the completion of the operation.  No autologous blood products were administered. DD:  07/02/00 TD:  07/03/00 Job: [id number]

## 2011-05-10 NOTE — Discharge Summary (Signed)
NAME:  [name] [name], [name] [name]              ACCOUNT NO.:  [id number]   MEDICAL RECORD NO.:  [id number]          PATIENT TYPE:  INP   LOCATION:  A211                          FACILITY:  [facility]   PHYSICIAN:  Angus G. Renard Matter, MD   DATE OF BIRTH:  03-Mar-1936   DATE OF ADMISSION:  02/18/2007  DATE OF DISCHARGE:  03/05/2008LH                               DISCHARGE SUMMARY   DISCHARGE DIAGNOSES:  1. Chronic obstructive pulmonary disease.  2. Congestive heart failure.  3. Hypertension.  4. Hyperlipidemia.  5. Hypothyroidism.  6. Diabetes mellitus, insulin-dependent.   CONDITION ON DISCHARGE:  Stable at time of her discharge.   HISTORY OF PRESENT ILLNESS:  This 75 year old, white female presented to  the ED with chief complaint being increased cough and wheezing over a  period of several days.  The patient had known chronic obstructive  pulmonary disease and when examined by ED physician felt to have  exacerbation of chronic obstructive pulmonary disease and bronchitis.  She received albuterol/Atrovent nebulizer treatments in the ED and  started on intravenous steroids.  Her O2 saturations remained in the  90s, but she continue to have difficulty with wheezing and shortness.  She did have a chest x-ray which showed evidence of chronic bronchitis,  but no pneumonia.   PHYSICAL EXAMINATION:  GENERAL:  Uncomfortable, white female with blood  pressure 149/68, respirations 22, pulse 90, temperature 98.  HEENT:  PERRLA.  TMs negative.  Oropharynx benign.  NECK:  Supple.  No JVD or thyroid abnormalities.  HEART:  Regular rhythm, no murmurs.  No cardiomegaly.  LUNGS:  Rhonchi and scattered wheezes throughout both lung fields.  ABDOMEN:  No palpable organs or masses.  No organomegaly.  EXTREMITIES:  Free of edema.   LABORATORY DATA AND X-RAY FINDINGS:  Admission CBC with WBC 8800,  hemoglobin 14, hematocrit 41.4.  Subsequent CBC on February 23, 2007, with  WBC 14,900 with hemoglobin 13.9, hematocrit  41.2.  Blood gases on  admission with pH 7.35 with pCO2 of 35, pO2 of 80.  Subsequent blood gas  with pH 7.4, pCO2 of 45, pO2 100.  Chemistries on admission showed a  sodium 141, potassium 3.6, chloride 105, glucose 166.  Chemistries on  February 23, 2007, with sodium 138, potassium 4.2, chloride 98, CO2 32,  glucose 304, BUN 19, creatinine 0.7.  GFR greater than 60.  Myoglobin  60.9.  CPK-MB 1.4, troponin 0.05.   Chest x-ray on admission with chronic bronchitic markings, no focal  pneumonia.  Electrocardiogram with left axis deviation, septal infarct,  age undetermined.  Normal sinus rhythm.   HOSPITAL COURSE:  The patient, at the time of her admission, was placed  on a 2000 calories ADA diet with IV normal saline KVO rate.  Vital signs  were monitored four times a day.  [facility] standing orders were put in  place.  She was given subcutaneous Lovenox 40 mg daily.  She started on  intravenous Solu-Medrol 125 mg every 6 hours, Humibid L.A. one b.i.d.,  Robitussin 1 tsp four times a day, Levaquin 750 mg intravenously daily.  She was continued on Cozaar  100 mg daily, Synthroid 0.25 mcg daily,  Lipitor 20 mg daily, aspirin 81 mg daily, Xanax 0.5 mg every 4 hours  p.r.n., metformin 50 mg b.i.d.  She was continued on Lantus insulin 55  units b.i.d.  Accu-Cheks q.a.c. and nightly and [facility] sliding scale  Humalog insulin, moderate severity.  She was placed on nasal O2 at 2 L.  The patient continued to have problems with her breathing initially and  showed gradual improvement during her hospital stay.  She did have  markedly elevated blood sugars thought to be aggravated by IV steroids.  She had to have increased dosage of Lantus insulin 68 units b.i.d. and  sliding scale NovoLog insulin.  The patient showed gradual improvement  during her hospital stay which is a total of 7 days. Towards the latter  part of hospital stay, she was switched over to prednisone p.o. 10 mg  four times a day.  She did  have some constipation problems during the  latter part of her hospital stay and received a Fleet enema.  Her oxygen  was able be discontinued towards the latter part of hospital stay and  she was able be discharged after a total of 7 days hospitalization.   DISCHARGE MEDICATIONS:  1. Lantus insulin 60 units b.i.d. p.r.n. Humalog insulin, according to      sliding scale.  2. Losartan 100 mg daily.  3. Synthroid 25 mcg daily.  4. Lipitor 20 mg daily.  5. Aspirin 81 mg daily.  6. Metformin 500 mg b.i.d.  7. Vicodin 5/325 p.r.n.  8. Xanax 0.5 mg p.r.n.  9. Mucinex 1200 mg b.i.d.  10.Robitussin 1 tsp four times a day.  11.Xopenex HFA inhaler two puffs q.4 h. p.r.n.  12.Detrol LA 4 mg daily.  13.Levaquin 750 mg daily.  14.Prednisone 10 mg t.i.d. x3 days, then taper.   FOLLOW UP:  The patient was asked to return to the office for followup.      Angus G. Renard Matter, MD  Electronically Signed     AGM/MEDQ  D:  03/17/2007  T:  03/17/2007  Job:  [id number]

## 2011-05-10 NOTE — Group Therapy Note (Signed)
NAME:  [name] [name], [name] [name]              ACCOUNT NO.:  [id number]   MEDICAL RECORD NO.:  [id number]           PATIENT TYPE:  INP   LOCATION:  A211                          FACILITY:  [facility]   PHYSICIAN:  Angus G. Renard Matter, MD   DATE OF BIRTH:  02-18-1936   DATE OF PROCEDURE:  DATE OF DISCHARGE:                                 PROGRESS NOTE   This patient was admitted to the hospital with respiratory distress.  She does have chronic obstructive pulmonary disease and bronchitis,  insulin-dependent diabetes, coronary artery disease, congestive heart  failure, continues to have some difficulty with her breathing, but she  has improved basically.  She did have elevated blood sugar in the 400  range last evening and was given insulin.   OBJECTIVE:  VITAL SIGNS:  Blood pressure 139/70, respirations 20 pulse  101, temperature 97, blood sugars range from 319-446.  HEART:  Regular rhythm.  LUNGS:  Rhonchi and scattered wheezes over lower lung field.  ABDOMEN:  No palpable organs or masses   ASSESSMENT:  The patient was admitted with above-stated problems,  chronic obstructive pulmonary disease, bronchitis, insulin-dependent  diabetes, coronary artery disease and CHF.  Plan to continue current  regimen.  Continue to monitor blood sugars.      Angus G. Renard Matter, MD  Electronically Signed     AGM/MEDQ  D:  02/20/2007  T:  02/20/2007  Job:  [id number]

## 2011-05-10 NOTE — Group Therapy Note (Signed)
NAME:  [name], [name]              ACCOUNT NO.:  [id number]   MEDICAL RECORD NO.:  [id number]          PATIENT TYPE:  INP   LOCATION:  A204                          FACILITY:  [facility]   PHYSICIAN:  Catalina Pizza, M.D.        DATE OF BIRTH:  12-25-1935   DATE OF PROCEDURE:  09/13/2006  DATE OF DISCHARGE:                                   PROGRESS NOTE   SUBJECTIVE:  [name] [name] was admitted for cough and dyspnea and shortness  of breath felt secondary to exacerbation of COPD.  Also was found to have  significant elevated blood sugars and is admitted for further evaluation of  this.  She states that her breathing is improving, and she is not coughing  as much and not bringing up the thick, dark stuff that she was prior to  admission.  She states her sugars have been fluctuating all over, but denies  any other chest pain.  No significant abdominal pain.  Overall feeling much  improved other than some mild increase in anxiety felt secondary to the  nebulizers as well as steroids.   OBJECTIVE:  VITAL SIGNS:  Temperature:  97.6.  Blood pressure:  149/77.  Pulse:  85.  Respirations:  18.  O2:  She is satting about 93% on room air.  Blood sugars have been ranging from 297 to 358.  GENERAL:  This is an obese, white female.  The patient is sitting in a chair  in no acute distress.  HEENT:  Unremarkable.  LUNGS:  Good air movement throughout.  Does have some expiratory wheezing  and a few crackles at the bases bilaterally.  HEART:  Regular rate and rhythm.  No murmurs, gallops, or rubs.  ABDOMEN:  Soft, nontender, and nondistended.  Positive bowel sounds.  EXTREMITIES:  No lower extremity edema.   LAB WORK:  No new lab work obtained.   IMPRESSION:  [name] [name] was admitted for chronic obstructive pulmonary  disease type exacerbation plus or minus bronchitis and poorly controlled  diabetes.   ASSESSMENT/PLAN:  1. We will continue with the intravenous antibiotics as well as      intravenous  steroids.  We will make some changes to her steroid regimen      as well as her nebulizer schedule.  If having improvement overall with      this, we will write for some cough medicine as well.  2. Diabetes mellitus type 2.  Very uncontrolled diabetes.  Unclear of her      last A1c but given the elevation, we will slightly increase her Lantus      to 55 units b.i.d. and monitor for further hyperglycemia which may be      secondary      to her steroids.  3. Anxiety.  We will increase her anxiety medicine up to where she was at      baseline.   She will be followed by Dr. Renard Matter in the morning here.      Catalina Pizza, M.D.  Electronically Signed     ZH/MEDQ  D:  09/13/2006  T:  09/15/2006  Job:  [id number]

## 2011-05-10 NOTE — Consult Note (Signed)
NAME:  [name] [name], [name] [name]                           ACCOUNT NO.:  [id number]   MEDICAL RECORD NO.:  [id number]                   PATIENT TYPE:  INP   LOCATION:  A222                                 FACILITY:  [facility]   PHYSICIAN:  Vida Roller, M.D.                DATE OF BIRTH:  1936/12/15   DATE OF CONSULTATION:  01/14/2003  DATE OF DISCHARGE:                                   CONSULTATION   PRIMARY PHYSICIAN:  Angus G. Renard Matter, M.D.   CARDIOLOGIST:  Jesse Sans. Wall, M.D. [facility]   REASON FOR CONSULTATION:  This is a 75 year old woman with known coronary  artery disease who comes in admitted with pneumonia who has chest discomfort  and a mildly abnormal troponin.   HISTORY OF PRESENT ILLNESS:  The patient is a 75 year old white female with  severe coronary artery disease followed by my colleague, Dr. Juanito Doom, in  the office.  Her significant past medical history includes coronary artery  bypass grafting in July 2001 after multiple episodes of failed percutaneous  revascularization of her left anterior descending coronary artery who was  admitted on January 22 for what was felt to be a community-acquired  pneumonia  complicated by mild bronchospasm.  She was treated with IV  antibiotics and IV steroids with reasonable improvement in her symptoms;  however, she had an episode of right-sided chest discomfort radiating down  to her left shoulder with numbness in her left arm that was relieved only  with repositioning.  She reports that over the last two weeks she has had  sort of unusual feelings in her chest associated with some mild increase in  dyspnea on exertion and mild increase in orthopnea but otherwise no real  overt chest discomfort.  This episode yesterday she says was very similar to  her previous episodes of angina.   PAST MEDICAL HISTORY:  Significant for the severe coronary disease, as  described.  She underwent percutaneous intervention x4 prior to her bypass  graft and  then received a left internal mammary to her LAD and a saphenous  vein to a diagonal in July 2001.  She then subsequently underwent a  catheterization in May 2002 where she was found to have significant disease  in the mid LAD; however, all her bypass grafts were patent and the remainder  of her coronary arteries looked fine.  Her left ventricular function at that  time was normal.  She has hypertension, diabetes, hyperlipidemia,  hypothyroidism.  She has previously had a CVA without significant sequelae.  She is intolerant to ACE INHIBITORS with a cough.   PAST SURGICAL HISTORY:  A total abdominal hysterectomy, an appendectomy, a  rotator cuff surgery repair, and two back surgeries.   ALLERGIES:  1. CODEINE.  2. She gets a cough with ACE INHIBITORS, as described previously.   MEDICATIONS ON ADMISSION:  1. Actos 45 mg a day.  2. Aspirin 81 mg a day.  3. Lipitor 20 mg a day.  4. Xanax 0.5 mg at night to sleep.  5. Synthroid 25 mcg a day.  6. Cozaar 100 mg a day.  7. Insulin Lantus 40 mg at night.   In the hospital, the addition of albuterol and Solu-Medrol, as well as  subcutaneous heparin.  She is also on Rocephin 1 g IV daily.   SOCIAL HISTORY:  She lives in [place].  She is a widow and lives by  herself.  She does not smoke cigarettes.  She does not drink alcohol.  She  has a significant history of both parents with coronary artery disease.   REVIEW OF SYSTEMS:  Noncontributory.   PHYSICAL EXAMINATION:  VITAL SIGNS:  She is afebrile at 98.2, pulse rate is  86, respiratory rate is 22.  Her blood pressure is 109/53.  GENERAL:  She is a well-developed, obese white female in no apparent  distress who is alert and oriented x4 and an excellent historian.  HEENT:  Unremarkable with normocephalic, atraumatic.  Pupils are equal and  round and reactive to light.  Funduscopic exam was deferred.  NECK:  Supple with no jugular venous distention or carotid bruits.  There  was no  lymphadenopathy noted.  CARDIAC:  Distant heart sounds but no obvious murmurs and the rhythm was  regular.  LUNGS:  Coarse breath sounds in the bases, most significant on the right,  with scattered wheezes but adequate air movement.  ABDOMEN:  Obese, soft, nontender with normoactive bowel sounds.  No  hepatosplenomegaly.  GENITOURINARY/RECTAL:  Exams are deferred.  EXTREMITIES:  Trace bilateral edema with good distal pulses.  No bruits are  noted.  NEUROLOGIC:  Nonfocal.   LABORATORY DATA:  Her chest x-ray done on January 21 shows  minimal  bronchiectatic change but no infiltrates and no congestive heart failure.  Electrocardiogram is sinus rhythm with a rate of 75 with a mild left axis  shift.  Normal intervals but poor R-wave progression consistent with an  anterior wall myocardial infarction.  No change since her previous EKG on  May 15, 2001.   White blood cell count of 4.4, H&H of 13 and 38, with a platelet count of  171,000.  Her sodium is 135, potassium is 4.0, chloride is 102, and  bicarbonate is 30.  BUN and creatinine are 8 and 0.7 and her fasting blood  sugar is 192.  Her liver function studies are within normal limits.  Her  initial set of cardiac enzymes reveal a CK of 109 with a CK-MB of 2.9 and a  troponin-I of 0.34, which is slightly above normal.  Room air blood gas  shows a pH of 7.4 with a PCO2 of 40, a PO2 of 61 at 91%.   ASSESSMENT:  1. Abnormal cardiac enzymes in a patient with chest discomfort and known     coronary disease.  Without extensive evaluation, it is difficult to     assess a single set of cardiac enzymes; however, the mildly elevated     troponin and the CK's are inconsistent with an acute myocardial     infarction.  This one episode of chest discomfort was quite limited and     it is difficult to assess the etiology of this.  Her electrocardiogram is    unchanged and the characteristics of the pain, although consistent with     her previous  angina, is relatively short lived.  2. Hypertension, which  is well controlled.  3. Community-acquired pneumonia.  She is adequately treated for this.  4. Diabetes.  She is reasonably well controlled.  The hope is that her     diabetes can be better controlled on an adjustment of her insulin.  5. Hyperlipidemia on Zocor.  6. Hypothyroidism on Synthroid.  7. Cardiac risk factors are adequately assessed.   RECOMMENDATIONS:  1. She should be moved to telemetry floor where she can have adequate     monitoring of her cardiac profile.  2. We recommend that consideration be made for the addition of a beta-     blocker at low dose as an antianginal.  We further recommend that she be     evaluated with an adenosine Cardiolite.  We will schedule this for Monday     morning.                                               Vida Roller, M.D.    JH/MEDQ  D:  01/14/2003  T:  01/15/2003  Job:  [id number]   cc:   Angus G. Renard Matter, M.D.  [address]  [place]  [place] [address]  Fax: [phone]   Jesse Sans. Wall, M.D. [facility]  [address]  [place]  [place] [address]  Fax: 1

## 2011-05-10 NOTE — Group Therapy Note (Signed)
   NAME:  [name] [name], [name] [name]                           ACCOUNT NO.:  [id number]   MEDICAL RECORD NO.:  [id number]                   PATIENT TYPE:  INP   LOCATION:  A222                                 FACILITY:  [facility]   PHYSICIAN:  Angus G. Renard Matter, M.D.              DATE OF BIRTH:  Oct 24, 1936   DATE OF PROCEDURE:  DATE OF DISCHARGE:                                   PROGRESS NOTE   SUBJECTIVE:  This patient continues to cough up thick yellow sputum and has  become extremely anxious following nebulizer treatment.  She was admitted  with bronchopneumonia, possibly viral syndrome, bronchitis.  She does have  diabetes, coronary artery disease.   OBJECTIVE:  VITAL SIGNS:  Blood pressure 98/56, respirations 22, pulse 98,  temperature 97.6.  LUNGS:  Expiratory wheezes.  HEART:  Regular rhythm.  ABDOMEN:  No palpable organs or masses.   ASSESSMENT:  The patient admitted with bronchitis, possible early  bronchopneumonia.   PLAN:  Continue current regimen.  Will change albuterol to Xopenex nebulizer  treatment.  Will order EKG, cardiac enzymes, ABG.                                               Angus G. Renard Matter, M.D.    AGM/MEDQ  D:  01/14/2003  T:  01/14/2003  Job:  [id number]

## 2011-05-10 NOTE — Procedures (Signed)
   NAME:  [name] [name], [name] [name]                           ACCOUNT NO.:  [id number]   MEDICAL RECORD NO.:  [id number]                   PATIENT TYPE:  OUT   LOCATION:  RESP                                 FACILITY:  [facility]   PHYSICIAN:  Edward L. Juanetta Gosling, M.D.             DATE OF BIRTH:  [birth year]   DATE OF PROCEDURE:  DATE OF DISCHARGE:                              PULMONARY FUNCTION TEST   IMPRESSION:  1. Spirometry shows a moderate ventilatory defect.  The flow volume loop has     a typical appearance for restrictive change.  2. Lung volumes show restrictive change of about the same degree as the     ventilatory defects previously described.  3. DLCO is severely reduced.  4. Arterial blood gases are normal.  5. There is no significant bronchodilator response.                                               Edward L. Juanetta Gosling, M.D.    ELH/MEDQ  D:  03/31/2003  T:  04/01/2003  Job:  [id number]

## 2011-05-10 NOTE — Discharge Summary (Signed)
NAME:  [name] [name], [name] [name]                           ACCOUNT NO.:  [id number]   MEDICAL RECORD NO.:  [id number]                   PATIENT TYPE:  INP   LOCATION:                                       FACILITY:   PHYSICIAN:  Edward L. Juanetta Gosling, M.D.             DATE OF BIRTH:  04-03-1936   DATE OF ADMISSION:  01/15/2003  DATE OF DISCHARGE:                                 DISCHARGE SUMMARY   DISCHARGE/TRANSFER SUMMARY:   DISPOSITION:  The patient being transferred to [facility].   FINAL DISCHARGE DIAGNOSES:  1. Bronchopneumonia.  2. Chest pain, possible unstable angina.  3. Diabetes mellitus.  4. Hypertension.  5. History of coronary artery occlusive disease.   BRIEF HISTORY:  The patient is a 75 year old who has multiple medical  problems and who presented to the emergency room with a 2-day history of  headaches, myalgias, nausea, and vomiting.  She had a cough which is  nonproductive, general malaise, she has not been able to get up and around.  She has had a son who is hospitalized with the same problem.  Chest x-ray  done in the emergency room shows a left lingular infiltrate.  She is mildly  febrile with normal white blood count.  She has a long-known history of  coronary artery disease.   PHYSICAL EXAMINATION:  VITAL SIGNS:  Blood pressure 150/87, pulse 100,  temperature 100.7, respirations 22-24, O2 saturation 97%.  GENERAL:  She was dyspneic on exertion just moving around on the stretcher.  She had a hacking cough.  CHEST:  Her chest showed decreased breath sounds with some wheezes, heart  sounds were distant.   LABORATORY:  Her lab work showed that her white count was normal.   HOSPITAL COURSE:  She initially did fairly well then complained of some  chest discomfort.  Consultation was obtained with Dr. Dorethea Clan of the [facility]  Cardiology Team and she was transferred from the third floor where she was  initially admitted to second floor where she could be monitored.   She had  serial cardiac enzymes and was noted to have elevated troponin level,  although her CPK and CPK-MB were not greatly elevated.  She is however,  having chest pain and considering the chest pain and the elevation of  cardiac enzymes, a phone consultation was obtained with Dr. Berton Mount of  [facility] with the thought that she may need further care.  I  discussed all this with the patient and after discussion with Dr. Graciela Husbands it  was suggested that she could either be transferred to our intensive care  unit, begun on nitroglycerin and heparin drips and Integrilin and will be  transferred to [facility].  At this point, she wants to be transferred to [facility].  I am going  to put her on nitroglycerin drip, begin heparin, and get her transferred to  [facility] to the care of Dr. Berton Mount who has agreed to accept her.  She is a  patient of Dr. Renard Matter.                                               Edward L. Juanetta Gosling, M.D.    ELH/MEDQ  D:  01/15/2003  T:  01/16/2003  Job:  [id number]   cc:   Angus G. Renard Matter, M.D.  [address]  [place]  [place] [address]  Fax: [phone]

## 2011-05-10 NOTE — H&P (Signed)
NAME:  [name] [name], [name] [name]                           ACCOUNT NO.:  [id number]   MEDICAL RECORD NO.:  [id number]                   PATIENT TYPE:  OUT   LOCATION:  RAD                                  FACILITY:  [facility]   PHYSICIAN:  Vickki Hearing, M.D.           DATE OF BIRTH:  26-Apr-1936   DATE OF ADMISSION:  09/26/2003  DATE OF DISCHARGE:  09/26/2003                                HISTORY & PHYSICAL   CHIEF COMPLAINT:  Left knee pain.   HISTORY OF PRESENT ILLNESS:  Mrs. [name] [name] is a 75 year old female who fell at  [facility] in [place] on August 24, 2003.  She was initially seen  at an Urgent Care Facility and had an x-ray done and an evaluation.  The x-  rays were normal.  She had abrasions and bruises to her head, both knees and  her elbow.  She came home to [place], where she lives, and presented to  [facility] Emergency Room because of continued headache, and had a CT scan,  which was normal.  She was placed in a knee immobilizer and has been treated  for left knee pain for approximately six weeks.   Initially, her exam revealed tenderness of the distal pole of the patella,  at the patella tendon/patellar junction.  She continued to have pain on the  medial side of the knee as well and eventually had an MRI done.  The MRI  showed a medial meniscal tear with a possible tear of the patella  retinaculum.  After six weeks of non-operative care, she has not improved  and wishes to have surgical intervention.  She has given informed consent  for such.   REVIEW OF SYSTEMS:  The patient reports shortness of breath, chronic  obstructive pulmonary disease, headache, which has now resolved after 4-6  weeks, thyroid disease and goiter, diabetes.  All other systems were normal.   ALLERGIES:  She is allergic to CODEINE.   MEDICATIONS:  Cozaar, Actos, Synthroid, Bayer Aspirin, Lasix, Lantus,  Lipitor, albuterol.   PAST MEDICAL HISTORY:  1. Previous surgeries include coronary  artery bypass.  2. Bilateral carpal tunnel releases.  3. Lumbar spine surgery, twice.  4. Bilateral rotator cuff repair.  5. Appendectomy.  6. Hysterectomy.   FAMILY HISTORY:  1. Heart disease.  2. Lung disease.  3. Arthritis.   SOCIAL HISTORY:  Single.  She does not smoke or drink.  Her education was  completed through the eighth grade.   PHYSICAL EXAMINATION:  GENERAL:  She is well developed and nourished.  Grooming and hygiene are normal.  She has a large body habitus.  VITAL SIGNS:  Her vital signs were stable.  CARDIOVASCULAR:  Her cardiovascular exam showed no swelling or varicosities.  Pulses were normal.  EXTREMITIES:  Were warm to touch with no edema or tenderness.  BACK/LYMPHATICS:  The cervical spine and lymph nodes were normal.  SKIN:  The skin was normal in all four extremities as well as the head and  neck area.  NEUROLOGIC:  Her neurologic exam showed normal sensation, reflexes.  She was  awake and alert x 3.  Mood and affect were normal.  MUSCULOSKELETAL:  Reveals an antalgic gait, favoring the left lower  extremity.  The left knee is tender at the inferior pole of the patella and  at the medial joint line.  Her range of motion has improved from initially  only 80 degrees to now approximately 100 degrees.  No instability is noted.  Her muscle strength and tone are normal.  She does not have joint effusion.  She does have pain with external rotation of the knee; this is referred to  the medial side of the joint.   LABORATORY DATA:  The data reviewed include x-rays, which were essentially  normal.  MRI showed strong possibility of medial meniscal tear with tearing  of the patella retinaculum.   OVERALL IMPRESSION:  1. Medial meniscal tear, left knee.  2. Tear, patella retinaculum, left knee.   PLAN:  Arthroscopy, left knee.  ICD-9 code 717.2.      ___________________________________________                                         Vickki Hearing,  M.D.   SEH/MEDQ  D:  10/13/2003  T:  10/13/2003  Job:  [id number]   cc:   [facility]

## 2011-05-10 NOTE — Group Therapy Note (Signed)
NAME:  [name] [name], [name] [name]              ACCOUNT NO.:  [id number]   MEDICAL RECORD NO.:  [id number]          PATIENT TYPE:  INP   LOCATION:  A204                          FACILITY:  [facility]   PHYSICIAN:  Angus G. Renard Matter, MD   DATE OF BIRTH:  1936/01/20   DATE OF PROCEDURE:  09/14/2006  DATE OF DISCHARGE:  09/16/2006                                   PROGRESS NOTE   This patient admitted with cough, dyspnea, exacerbation of COPD, and insulin-  dependent diabetes in poor control. She continues to have intermittent  headache, intermittent cough and wheezing.   OBJECTIVE:  VITAL SIGNS:  Blood pressure 144/81, respirations 18, pulse  61,temperature 98.0. Blood sugars range from 294-451.  CBC shows white blood  count 12.3, 87% neutrophils.  LUNGS:  Diminished breath sounds.  HEART:  Regular rhythm.  ABDOMEN:  No palpable organs or masses.  The patient had mild edema.   ASSESSMENT:  The patient was admitted with exacerbation of chronic  obstructive pulmonary disease, bronchitis, and poorly controlled diabetes.   PLAN:  To continue current IV antibiotics.  Continue sliding scale Humalog  insulin and increase Lantus insulin.      Angus G. Renard Matter, MD  Electronically Signed     AGM/MEDQ  D:  09/14/2006  T:  09/16/2006  Job:  [id number]

## 2011-05-10 NOTE — Group Therapy Note (Signed)
NAME:  [name] [name], [name] [name]              ACCOUNT NO.:  [id number]   MEDICAL RECORD NO.:  [id number]          PATIENT TYPE:  INP   LOCATION:  A211                          FACILITY:  [facility]   PHYSICIAN:  Catalina Pizza, M.D.        DATE OF BIRTH:  02-25-1936   DATE OF PROCEDURE:  02/22/2007  DATE OF DISCHARGE:                                 PROGRESS NOTE   SUBJECTIVE:  [name] is admitted to the hospital due to  respiratory distress secondary to COPD and bronchitis exacerbation.  She  has insulin-dependent diabetes, coronary artery disease and a history of  congestive heart failure as well.  She feels her breathing overall at  this time is a little bit better but did have coughing episode last  night with nonproductive in nature.  Still continues to have a mild  headache at this time.   OBJECTIVE:  VITAL SIGNS:  Temperature is 96.6, blood pressure is 136/72,  pulse 76, respirations 20, sating 99% on 2L oxygen.  CBGs have been 321,  317 and 329 respectively.  GENERAL:  This is an obese white female lying in bed in no acute  distress.  HEENT:  Unremarkable.  HEART:  Regular rate and rhythm.  No murmurs, gallops or rubs.  LUNGS:  Show mildly better air movement than yesterday, did not  appreciate any wheezing at this time.  No rhonchi or crackles  appreciated at this time but still does have diminished air movement as  a whole.  ABDOMEN:  Soft, nontender, nondistended, positive bowel sounds.  EXTREMITIES:  No appreciable lower extremity edema.  NEUROLOGIC:  Alert and oriented x3.   No lab work obtained.   IMPRESSION:  [name] with chronic obstructive  pulmonary disease and bronchitis exacerbation admitted for shortness of  breath.   ASSESSMENT AND PLAN:  1. Chronic obstructive pulmonary disease and bronchitis.  Continue      with antibiotics, nebulizer treatments and steroids.  2. Insulin-dependent diabetes.  Even with increased dose of Lantus to      16  units twice daily, she continues to run in 300's.  Will increase      her sliding scale insulin up 2 units in all levels as well as      increase the Lantus insulin as well.  3. Coronary artery disease and congestive heart failure, appears to be      doing well at this time.  4. Hypothyroidism.  Continue current dose.   DISPOSITION:  Continue with all of the above-mentioned therapies.  I  question whether some of this mediated from allergic type response as  well.  She may benefit from being put on Singulair or generic Allegra to  help with some of her allergy type symptoms at this time.      Catalina Pizza, M.D.  Electronically Signed     ZH/MEDQ  D:  02/22/2007  T:  02/22/2007  Job:  [id number]

## 2011-05-10 NOTE — Cardiovascular Report (Signed)
[place]  Patient:    [name] [name]                         MRN: [id number] Proc. Date: 11/13/99 Adm. Date:  [id number] Attending:  Mirian Mo CC:         Butch Penny, M.D.             Thomas C. Wall, M.D. [facility]             Cardiac Catheterization Laboratory                        Cardiac Catheterization  PROCEDURE: 1. Left heart catheterization, coronary angiography, left ventriculography. 2. High-speed rotational atherectomy with adjunctive balloon angioplasty of the    mid left anterior descending. 3. High-speed rotational atherectomy with adjunctive balloon angioplasty of the  second diagonal branch.  INDICATIONS:  [name] [name] is a 75 year old woman with insulin-requiring diabetes  mellitus.  She underwent high-speed rotational atherectomy of the second diagonal branch approximately three months ago.  She presented with recurrent anginal symptoms and a Cardiolite showed significant anteroapical ischemia.  DESCRIPTION OF PROCEDURE:  A 6 French sheath was placed in the right femoral artery.  Standard Judkins 6 French catheters were utilized.  Contrast was Omnipaque.  There were no complications.  CATHETERIZATION RESULTS:  HEMODYNAMICS:  Left ventricular pressure 148/26, aortic pressure 146/78.  There was no aortic valve gradient.  LEFT VENTRICULOGRAM:  There is mild akinesis of the anteroapical wall. Ejection fraction is estimated at 60%.  No mitral regurgitation.  CORONARY ARTERIOGRAPHY:  Right dominant system.  Left main:  Normal.  Left anterior descending:  The LAD gives rise to a small first diagonal of the large branch and second diagonal.  There is a diffuse 25% stenosis in the proximal LAD.  The mid LAD has a focal 99% stenosis just proximal to the bifurcation of he second diagonal.  In the origin of the second diagonal was a 75% stenosis.  The  distal LAD is relatively small caliber.  Left circumflex:  The  left circumflex gives rise to a small to normal sized ramus intermedius, a small OM-1, small OM-2 and a large OM-3.  The left circumflex is  free of angiographic disease.  Right coronary artery:  The right coronary artery is a dominant vessel giving rise to a normal sized posterior descending artery and a normal sized posterolateral  branch.  There is a tubular 40% stenosis at the origin of the right coronary and a 20% stenosis in the proximal right coronary.  IMPRESSION: 1. Left ventricular systolic function characterized by mild anteroapical akinesis    with preserved ejection fraction. 2. Critical one-vessel coronary artery disease involving the mid left anterior    descending.  This lesion is slightly proximal to where the previous    lesion was intervened upon in August.  The plan is for percutaneous intervention    of the left anterior descending, see below.  PERCUTANEOUS TRANSLUMINAL CORONARY ANGIOPLASTY PROCEDURE:  Following completion of the diagnostic catheterization, a 6 French sheath in the right femoral artery was exchanged over a wire for an 8 [place] sheath.  A 6 French sheath was placed in he right femoral vein.  Integrilin and heparin were administered per protocol.  A  [place] ______ 3.5 guiding catheter was utilized.  A rotafloppy gold wire was advanced into the distal LAD.  High-speed rotational atherectomy was performed  initially with a 1.5 mm bur at 175,000 RPM for two runs, the first for 15 seconds, the second for 23 seconds.  Following this, a 1.75 mm bur was utilized at 174,000 RPM for 22 seconds followed by 15 seconds.  Next, the wire was repositioned into the second diagonal.  Rotational atherectomy was again performed with a 1.75 mm bur at 106,000 RPM for 15 seconds and then for 20 seconds.  Following this a BMW wire was advanced into the LAD.  A 2.5 x 20 mm Ranger balloon was advanced over the MW wire and positioned across the mid LAD where it  was inflated to 6 atmospheres for 60 seconds.  The balloon was advanced slightly forward and inflated to 4 atmospheres for 60 seconds.  The balloon was then pulled back just proximal to he bifurcation and inflated to 12 atmospheres for 60 seconds.  Finally, the balloon was advanced slightly more distally and inflated to 2 atmospheres for 60 seconds to break an area of spasm.  Next, the same balloon was advanced over the rotafloppy wire into the second diagonal where two inflations were performed.  The first to 6 atmospheres and the second to 9 atmospheres for 60 seconds each.  Final angiographic images were obtained to reveal patency of both vessels.  There was 25% residual stenosis in the mid LAD and 25% residual stenosis in the second diagonal. There was TIMI-3 flow with no evidence of dissection.  COMPLICATIONS:  None.  RESULTS:  Successful high-speed rotational atherectomy with balloon angioplasty of the mid LAD and the second diagonal.  A 99% stenosis in the mid LAD was reduced to 25% and the 75% stenosis in the second diagonal is reduced to 25%.  There was timi3 flow.  PLAN:  Integrilin will be continued for 20 hours. DD:  11/13/99 TD:  11/14/99 Job: [id number]

## 2011-05-10 NOTE — Discharge Summary (Signed)
NAME:  [name] [name], [name] [name]              ACCOUNT NO.:  [id number]   MEDICAL RECORD NO.:  [id number]          PATIENT TYPE:  INP   LOCATION:  5002                         FACILITY:  [facility]   PHYSICIAN:  Doralee Albino. Carola Frost, M.D. DATE OF BIRTH:  29-Jun-1936   DATE OF ADMISSION:  04/18/2008  DATE OF DISCHARGE:  04/23/2008                               DISCHARGE SUMMARY   DISCHARGE DIAGNOSES:  1. Comminuted right humeral shaft fracture.  2. Radial nerve palsy.   ADDITIONAL DISCHARGE DIAGNOSES:  1. Coronary artery disease status post coronary artery bypass graft in      2000.  2. Hypertension.  3. Insulin-dependent type 2 diabetes.  4. Chronic bronchitis.  5. History of cardiovascular accident and transient ischemic attack.  6. Hypothyroidism.  7. History of bilateral carpal tunnel syndrome status post bilateral      release.  8. Sleep apnea.   PROCEDURES PERFORMED:  1. On April 18, 2008, open reduction and internal fixation for right      humeral shaft fracture.  2. Exploration of radial nerve with neurolysis.   BRIEF HISTORY OF HOSPITAL COURSE:  [name] [name] is a very pleasant 75-  year-old right-hand dominant female who sustained a comminuted humeral  shaft fracture on April 18, 2008, while entering court house for jury  duties.  The patient presented to [facility] Emergency Department for  evaluation, which demonstrated a right humeral shaft fracture with  comminution.  The patient stated that she did not pass out, but tripped  over the steps and landed on an outstretched hand and on her right arm  as well.  Initially in the emergency department, the patient did  complain of pain in her arm, which was exacerbated with movement.  They  was also associated numbness and tingling on the dorsal aspect of her  right hand with associated weakness in her hand and inability to move  her wrist and elbow secondary to pain.  [name] [name] was then brought  to the operating room that day, where  she underwent the procedure  described above, which was ORIF of the right humeral shaft with  exploration of the radial nerve, given her physical exam findings.   We noted that upon exploration of the radial nerve, the nerve itself was  demonstrated to be intact and it was not transected at any point.  In  addition, the nerve was well protected throughout the operative  procedure.  [name] [name] tolerated surgical procedure well and was  brought to the PACU in stable position and then was transferred to the  orthopedic floor.   On postoperative day #1, the patient did complain of some trigger pain  in the right arm and the neck, but was doing well upon clinical  encounter.  She did continue to complain of swelling and inability to  move her right hand to full range of motion.  She did work with OT and  PT on postoperative day #1 and did quite well.  Her labs were stable for  the first postoperative day.  Of note, on physical exam on postoperative  day #1, she  continued to have minimal wrist extension despite having  sensation along the radial nerve distribution.  On postoperative day #2,  the patient was doing well initially in the morning and did stated that  she felt comfortable with sitting and standing and she does not  tolerating p.o. too well.  Her labs continued to be stable with an H&H  of 11.5 and 33.4 respectively.  There was no significant change in her  physical exam of her right upper extremity without any clear digit or  wrist extension.  There was continued pain in the right upper extremity  as well.  [name] [name] was permitted to perform gentle pendulum of the  right shoulder with therapy and was not allowed to perform any active  motion, given the severe comminution fracture and is relatively unstable  in nature.  Subsequently, early afternoon, I was paged by the floor MS  stating that the patient was complaining of increased diaphoresis along  with decrease in her  blood pressure and heart rate.  When I arrived, the  patient was somewhat somnolent, but arousable.  At the time of her  active response, I was paged.  The patient was afebrile with her BPs in  the 100s/40-50s with a heart rate of 65-70.  She was sating 97% on 2 L,  but did desaturate to 85% after removal of O2.  The patient had received  pain medications and relaxants at 0640 hours and that was changed at  1130 hours.  She was not complaining of any chest pain and not noticed  any tingling, no dyspnea, or any calf tenderness.  Beta-blockers were  also held for this patient.  EKG was ordered, which demonstrated sinus  bradycardia.  The patient did demonstrate clear lung fields bilaterally  with a regular heart rate.  Her abdomen was nontender and nondistended  with positive bowel sounds.  We did give the patient a bolus of 250 mL  of normal saline and the patient did have some response.  There was no  significant response demonstrated from the trial of Narcan.  I spoke  with Internal Medicine Team, Dr. Ancil Boozer, and obtained a STAT ABG.  As a  result, the patient was then transferred down to the stepdown unit.  We  held narcotic medications in addition to holding her Robaxin.  On  postoperative day #3, in the stepdown unit, the patient was doing much  better, continued to complain of some right upper extremity pain,  although little comfortable.  She really had no recollection of events  of the day prior.  She is eager to get up out of the bed to the chair.  She did demonstrate a positive urine culture with E. coli that  eventually was shown to be resistant to fluoroquinolones and was  therefore started on cephalosporin for treatment.  Physical exam  continued to demonstrate decreased wrist and digit extension, but intact  sensation along the radial nerve.  The patient was also treated for a  wrist splint for which she would eventually be transitioned to dynamic  wrist splint given her  radial nerve palsy.  At this time, I also  initiated a low-dose Lovenox therapy due to the relative decreased  activity level of this patient.  On postoperative day #4, the patient  was doing certainly better and was eager to get back home.  She was  feeling better, but continued to have right arm pain, but decreased in  intensity.  She continued to have  inability to fully extend the fingers,  but was tolerating her splint well.  At this time, the patient was also  stable enough for transfer back to 5000.  On postoperative day #5, the  patient was stable from both medical and orthopedic perspectives to be  discharged to home with home health assistance.   PERTINENT DISCHARGE LABS:  Discharge CBC; white blood cell 9.6,  hemoglobin 10.7, hematocrit 31.3, and platelets 213.  Sodium 140,  potassium 4.5, chloride 102, CO2 32, BUN 4, creatinine 0.64, and glucose  96.  Again, urine culture demonstrated E. coli, which was resistant to  fluoroquinolones.   DISCHARGE MEDICATIONS:  1. Percocet 5/325 1-2 p.o. q.4-6 hours as needed for pain.  2. Oxycodone 5 mg 1-2 every 6 hours as needed for breakthrough pain      between Percocet doses.  3. The patient was counseled to take over-the-counter stool softeners      as needed for constipation.   She may resume her home medications as follows:  1. Cozaar 100 mg p.o. daily.  2. Synthroid 0.25 mg 1 p.o. daily.  3. Lipitor 20 mg 1 p.o. daily.  4. Lantus 55 units b.i.d.  5. Aspirin 81 mg p.o. daily.  6. Albuterol/ipratropium as needed.  7. Xanax 0.25 mg 1 p.o. p.r.n.  8. Metformin 500 mg 1 p.o. b.i.d.  9. The patient was also given Keflex 500 mg p.o. b.i.d. x1 more day      for early progress of her UTI.   DISCHARGE INSTRUCTIONS:  [name] [name] sustained a severe injury to her  right arm with radial nerve palsy.  It is not clear at this point in  time as to when her overall function will return.  She will be allowed  unrestricted elbow hand and  digit range of motions.  She will be  restricted from active hand abduction and shoulder motion for  approximately first 2 weeks.  She will be allowed to participate in  intentional activities of her right shoulder till that time.  Active  resistive motion is most likely to begin in approximately 6 to 8 weeks  and slowly progressing her to active motion at appropriate intervals.  Given that this was an upper extremity injury, we do not feel the need  to give outpatient local therapy for venous thromboembolism for this  patient.  She has been on coverage for approximately 3 days during her  hospital stay.  [name] [name] should also complete her antibiotic course  for her UTI as directed and take all medications appropriately.  Ms.  [name] will also need to wear her sling for comfort in her pendulum  exercises while in the sling.  She will also need to use her wrist  splint as a temporary measure until she is ready to be fitted for a  dynamic wrist splint, which will be carried out by occupational therapy.   With regards to the wound care, daily dressing changes should be  performed.  Once there is no drainage, the patient may leave the  incision open to air.  She is to wash with gentle soap and water only  once it is dry.  At no point in time should any ointment or  solution be  applied to the incision as these may be more detrimental to wound  healing.  They are continuing to keep a vigilant eye for any increasing  erythema or suspicious drainage and should they note any, are to contact  the office immediately.  [name] [name] will follow up in 10-14 days, at which time we will obtain  AP and lateral views of her right humerus to evaluate the fracture  healing as well as hardware placement.  Should the patient and her  family have any questions at any point in time, they should feel free to  contact the office at [phone].   The patient has also been set up for some home health  therapy for gentle  range of motion of her shoulder.  She is nonweightbearing of her right  upper extremity, which she understood to the right elbow, wrist, and  digit active range of motion and passive range of motion.  She may  perform very gentle range of motion on the right shoulder.      Mearl Latin, PA      Doralee Albino. Carola Frost, M.D.  Electronically Signed    KWP/MEDQ  D:  06/02/2008  T:  06/03/2008  Job:  [id number]

## 2011-05-10 NOTE — Cardiovascular Report (Signed)
[place]. [facility]  Patient:    [name] [name]                         MRN: [id number] Proc. Date: 01/17/00 Adm. Date:  [id number] Disc. Date: [id number] Attending:  Mirian Mo CC:         Veneda Melter, M.D. [facility]             Thomas C. Wall, M.D. [facility]             Butch Penny, M.D.                        Cardiac Catheterization  PROCEDURES PERFORMED: 1. Left heart catheterization. 2. Left ventriculogram. 3. Percutaneous transluminal coronary angioplasty and stenting of the left anterior    descending artery. 4. Percutaneous transluminal coronary angioplasty and stenting of the second    diagonal branch of the left anterior descending artery. 5. Perclose suture closure of the right femoral artery.  DIAGNOSIS: 1. Single-vessel coronary artery disease with restenosis status post intervention. 2. Normal left ventricular systolic function.  HISTORY:  [name] is a 75 year old white female with a known history of coronary artery disease, who presents with substernal chest discomfort.  The patient had  previously undergone percutaneous intervention to the LAD and major diagonal branch in August of 2000, with repeat intervention on November 13, 1999.  She presents now with a two to three day history of recurrent chest discomfort.  The patient was  admitted to the hospital and despite aggressive medical therapy, could not be made pain-free.  She presents now for urgent left heart catheterization.  TECHNIQUE:  After informed consent was obtained, the patient was brought to the  cardiac catheterization laboratory and both groins were sterilely prepped and draped.  One percent Lidocaine was used to infiltrate the right groin and a 7-French sheath was placed in the right femoral artery.  Six [place] JL-4 and JR-4 catheters were then used to engage the left and right coronary arteries and selective angiography was performed in various projections  using manual injections of contrast.  A 6-French pigtail catheter was advanced to the left ventricle and a left ventriculogram was performed using power injections of contrast.  INITIAL FINDINGS: 1. Left main trunk:  Large caliber vessel, with mild irregularities.  2. Left anterior descending artery:  This is a medium caliber vessel that provides    a trivial first diagonal branch in the proximal segment and a large second    diagonal branch in the mid section.  This diagonal branch further bifurcates in    its mid section.  The LAD then tapers as it extends to the apex.  The LAD has a    high-grade narrowing of 90% just prior to the second diagonal branch.  This    disease extends into and beyond this diagonal branch and compromises the distal    LAD by 70%.  The diagonal branch appears to have restenosis of approximately    60% in its proximal segment.  The distal LAD has mild diffuse irregularities.  3. Left circumflex artery:  The left circumflex artery is a large caliber vessel    that consists of a large marginal branch in the distal segment.  The left    circumflex system has mild irregularities.  4. Right coronary artery:  The right coronary artery is dominant.  This is a medium  caliber vessel that provides a posterior descending artery as well as a    posteroventricular branch in its terminal segment.  The right coronary artery    has mild irregularities.  LEFT VENTRICULOGRAM:  There are normal end-systolic and end-diastolic dimensions. Overall the left ventricular function is well preserved.  Ejection fraction is greater than 55%.  No mitral regurgitation regurgitation.  LV pressure is 120/10. Aortic pressure is 120/70.  LV EDP is 17.  There findings were reviewed with the patient and we have elected to proceed with percutaneous intervention to the LAD and major diagonal branch.  INTERVENTIONAL PROCEDURE:  The patient had previously been given heparin  bolus nd this was supplemented to maintain an ACT of greater than 250 seconds.  In addition, she was started on Integrilin drip.  The 7-French sheath in the right femoral artery was exchanged over a wire for an 8-French sheath and an initial attempt was made to engage the left coronary artery with a Voda left #4.  However, this could not be seated appropriately.  A Q-4 catheter was then used to engage the left coronary artery.  A 0.014 inch high-torque floppy wire was then advanced into the distal LAD and a second wire was placed into the diagonal branch.  A 2.5 x 20 mm CrossSail balloon was then used to predilate the diagonal branch at 10 atm x 31 seconds.  This was then positioned  into the LAD and used to predilate the LAD lesion at 8 atm x 31 seconds. Repeat angiography showed improvement of the lumen of the LAD and diagonal branch. However, there is moderate residual disease.  A 2.5 x 18 mm S660 was then carefully positioned in the LAD.  The floppy wire in the diagonal branch was carefully retracted and the stent deployed in the mid LAD at 14 atm x 31 seconds.  The floppy wire was then readvanced into the diagonal branch and the 2.5 CrossSail balloon  used to dilate the ostium of the diagonal branch at 8 atm x 35 seconds.  Repeat  angiography showed an excellent result within the LAD, with no residual stenosis. However, there appeared to be compromise of the ostium of the diagonal branch in the LAO cranial projection, which appeared to be significant.  We thus proceeded with stenting the diagonal branch.  A second 2.5 x 18 mm S660 stent was positioned in the diagonal branch, with overlap into the mid LAD.  This was deployed at 10 atm for 31 seconds.  The 2.5 x 20 mm CrossSail balloon was introduced into the LAD stent and simultaneous inflations were performed of the CrossSail and stent delivery system in the diagonal branch to fully seat the stents at  the bifurcation. Two simultaneous inflations were made.  The first one inflated the LAD balloon t 12 atm x 47 seconds while inflating the SDS at 2 atm x 47 seconds.  The second  inflation was performed with both balloons inflated at 2 atm x 30 seconds.  The  diagonal balloon was brought back further into the bifurcation.  Repeat angiography then showed an excellent result, with 0% residual narrowing within the mid and distal LAD and less than 10% narrowing at the ostium of the first diagonal branch. There was TIMI-3 flow through the LAD and diagonal branch, with no evidence of distal vessel damage.  Final angiography was then performed after the administration of intracoronary nitroglycerin confirming these findings.  The wires and guide catheter were then removed.  A Perclose  suture closure device was deployed to the right femoral artery as the patient was experiencing significant back discomfort despite heavy doses of Versed and Fentanyl.  Adequate hemostasis was achieved and the patient was transferred to the floor in stable condition.   FINAL RESULT: 1. Successful percutaneous transluminal coronary angioplasty and stenting of the    mid left anterior descending artery with reduction of 90% narrowing to 0%, with    placement of a 2.5 x 18 mm AVE S660 stent which was dilated to approximately    2.7 mm. 2. Successful percutaneous transluminal coronary angioplasty and stenting of the    major second diagonal branch of the left anterior descending artery with    reduction of 60% proximal narrowing to less than 10% with placement of a    2.5 x 18 mm S660 stent.  DD:  01/17/00 TD:  01/19/00 Job: [id number]

## 2011-05-10 NOTE — Discharge Summary (Signed)
NAME:  [name] [name], [name] [name]              ACCOUNT NO.:  [id number]   MEDICAL RECORD NO.:  [id number]          PATIENT TYPE:  INP   LOCATION:  A204                          FACILITY:  [facility]   PHYSICIAN:  Angus G. Renard Matter, MD   DATE OF BIRTH:  [birth year]   DATE OF ADMISSION:  09/10/2006  DATE OF DISCHARGE:  09/25/2007LH                                 DISCHARGE SUMMARY   A 75 year old white female admitted September 10, 2006, discharged September 16, 2006, 6 days' hospitalization.   DIAGNOSIS:  1. Exacerbation of chronic obstructive pulmonary disease and chronic      bronchitis.  2. Diabetes mellitus type 2.  3. Hyperlipidemia.  4. Hypertension.  5. Hypothyroidism.   CONDITION:  Stable at the time of her discharge.   This 75 year old white female presented to the ED with chief complaint being  increased cough and dyspnea.  The patient had been seen in the office  previously with cough and had been placed on Avelox 400 mg daily for the  past few days.  The patient apparently had increased difficulty with her  breathing.  She does have a history of COPD.  She was seen and evaluated by  ED physician.  Was noted to be in some respiratory distress with wheezing  and dyspnea.  She given albuterol and Atrovent neb treatment in the ED and  subsequently was admitted.   EXAMINATION:  Alert female with blood pressure 127/60, pulse 88, respiration  24.  HEENT:  Eyes:  PERRLA.  TMs negative.  Oropharynx benign.  NECK:  Supple.  No JVD or thyroid abnormalities.  HEART:  Regular rhythm.  No murmurs.  LUNGS:  Rhonchi and wheezes bilaterally.  ABDOMEN:  No palpable organs or masses.  EXTREMITIES:  Free of edema.  NEUROLOGICAL:  No focal deficit.   LABORATORY DATA:  Admission CBC:  WBC 10,100 with hemoglobin 15, hematocrit  43.5.  Subsequent CBC on September 14, 2006:  WBC 12,300 with hemoglobin  13.7, hematocrit 40.7.  Blood gases on admission:  PH 7.391 with a pCO2  35.2, pO2 79.4.  Chemistries  on admission:  Sodium 139, potassium 3.7,  chloride 106, CO2 26, glucose 276, BUN 7, creatinine 0.7, calcium 9.3, total  protein 6.1, albumin 3.6.   X-RAYS:  Chest x-ray:  No acute findings.  Chest x-ray on September 14, 2006:  Interval interstitial infiltrates and small effusions.  Head CT:  No  acute intracranial findings, evidence of mild chronic small-vessel ischemic  changes.   HOSPITAL COURSE:  At the time of her admission, the patient was placed on  nasal O2 at 2 liters per minute.  She was placed on Atrovent and albuterol  neb treatments every 4 hours, Solu-Medrol IV 125 mg every 6 hours, [place]  standing orders, Rocephin 1 g every 24 hours and Zithromax 500 mg every 24  hours.  She was continued on Lantus insulin 50 units h.s. and Humalog  sliding scale.  Accu-Cheks were monitored a.c. and h.s.  She was started on  subcutaneous Lovenox 40 mg daily.  Because of elevated blood sugar, the  patient  had to be placed on Glucomander according to protocol.  Lantus  insulin was changed to 50 units subcu b.i.d. after Glucomander was  discontinued.  She was continued on sliding scale.  She continued to cough  intermittently and had chest congestion throughout her hospital stay which  slowly improved.  Her Solu-Medrol dose was decreased to 80 mg every 8 hours.  Her Lantus insulin was gradually increased to 55 units each a.m. and 50  units each p.m.  On September 15, 2006, her Solu-Medrol was discontinued.  She was placed on prednisone 60 mg p.o. daily.  Her Rocephin was  discontinued on September 15, 2006.  She was continued on Zithromax.  The  patient was discharged feeling better.   She was discharged on:  1. Lipitor 20 mg daily.  2. Cozaar 100 mg daily.  3. Synthroid 0.25 mg daily.  4. Xanax 0.5 mg h.s.  5. Aspirin 81 mg daily.  6. Was given Medrol Dosepak.  7. Xopenex.  8. Continued on Levaquin 500 mg daily.  9. Lantus insulin 55 units each a.m. and 50 units h.s.       Angus G. Renard Matter, MD  Electronically Signed     AGM/MEDQ  D:  10/10/2006  T:  10/11/2006  Job:  [id number]

## 2011-05-10 NOTE — H&P (Signed)
NAME:  [name] [name], [name] [name] NO.:  [id number]   MEDICAL RECORD NO.:  [id number]                   PATIENT TYPE:  INP   LOCATION:  2930                                 FACILITY:  [facility]   PHYSICIAN:  Duke Salvia, M.D. [facility]           DATE OF BIRTH:  1936/04/09   DATE OF ADMISSION:  01/15/2003  DATE OF DISCHARGE:                                HISTORY & PHYSICAL   REASON FOR ADMISSION:  The patient is transferred from [facility]  due to the unrelenting chest pain and significant positive troponins.   HISTORY OF PRESENT ILLNESS:  The patient is a morbidly obese 75 year old  woman with a history of known coronary artery disease with bypass surgery in  2001 with a LIMA to her LAD and a vein graft to her diagonal which was  undertaken because of recurrent in-stent restenosis.  Approximately 1 year  later she presented with recurrent chest pain.  At that time her troponins  were elevated in the 0.4 range.  Generally catheterization demonstrated  patent grafts; however, there was a portion of circulation off the LAD  proximal to the diagonal stenosis and proximal to the LIMA insertion which  was thought to be potentially the source of her chest pain.  It was not felt  to be amenable to PCI.  Medical therapy was undertaken.   The patient is markedly limited in her overall functional capacity with  exercise intolerance, severe dyspnea, some peripheral edema which has  responded to diuretics but has not abated and orthopnea.  She has not had  lots of chest pain.   Her son had been ill with the flu.  The patient over the last couple of days  developed progressive myalgias, fevers, chills, and a dry cough.  She  presented to the hospital where she was found to have a fever of 100.7 or so  and was treated with antibiotics.  Yesterday she developed a right-sided  chest pain which was somewhat atypical, last night and this morning she  developed some more  typical midsternal chest pressure with radiation into  her left arm associated with numbness.  Her troponins obtained yesterday  were 0.5 or so and they had persisted in that range since then.  Her CK was  initially normal.  It bumped just above the normal range to 197 and 4.7 and  then had reverted back to the normal range.  Heparin was started.  Aspirin  was given.   PAST MEDICAL HISTORY:  Her past medical history is notable for diabetes,  hypertension, and hyperlipidemia, morbid obesity with question sleep apnea.  She also has a history of a prior stroke.  She is intolerant to ACE and has  treated hypothyroidism.   PAST SURGICAL HISTORY:  Her past surgical history is notable for an  abdominal hysterectomy, appendectomy, rotator cuff surgery and two back  surgeries.   MEDICATIONS:  Her medications on admission included Actos 45, aspirin 81,  Synthroid 25, Cozaar 100 and Lantus.  She has ultimately been put on  heparin, Zithromax, and Rocephin.   ALLERGIES:  She is allergic to CODEINE.  Intolerant to ACE INHIBITORS.   SOCIAL HISTORY:  She is widowed.  She has six children.  She lives in  [place].  She does not use cigarettes, alcohol, or recreational drugs.  She did have a 32-year exposure to cigarettes through her now deceased  husband.   REVIEW OF SYSTEMS:  Her review of systems is mostly notable as above.   PHYSICAL EXAMINATION:  GENERAL:  She was an obese white female in modest  discomfort.  VITAL SIGNS:  Blood pressure was 120/(?)31 with a pulse of 59.  HEENT:  No scleral icterus or xanthomata.  NECK:  Her neck was too big for me to be able to tell her veins; her  carotids were brisk without bruits.  BACK:  Her back was without kyphosis or scoliosis; she had occasional end-  expiratory wheezing.  HEART:  Her heart sounds were regular without murmurs or gallops.  ABDOMEN:  The abdomen was soft, protuberant, obese, and without any  tenderness.  EXTREMITIES:  Femoral  pulses were 2+, distal pulses were intact; there was  no clubbing, cyanosis, or edema.  NEUROLOGICAL:  Grossly normal.  SKIN:  Warm and dry.   ELECTROCARDIOGRAM:  Electrocardiogram demonstrates sinus rhythm at 59 with  intervals of 0.16/0.10/0.42 with an axis of -31.  This electrocardiogram is  notable for the intercurrent loss of anterior R waves compared to May 2002;  there are no acute changes, however.   IMPRESSION:  1. Persistent chest pain suggestive of ischemia developing while in the     hospital associated with persistent elevation of troponins wild a mild CK     intranormal bump.  2. Known coronary artery disease.     a. Status post coronary artery bypass graft x2 in 2001.     b. Ejection fraction normal.     c. Post coronary artery bypass graft chest pain associated with        catheterization showing patent grafts but disease proximal to the        revascularized left internal mammary artery segment and the diagonal        segment.  3. Bronchopneumonia, probably viral precipitating her admission.  4. Diabetes.  5. Hyperlipidemia.  6. Hypertension.  7. Prior cerebrovascular accident.  8. Morbid obesity with questionable obstructive sleep apnea.  9. Angiotensin converting enzyme intolerance.  10.      Treated hypothyroidism.   The patient has persistent and unrelenting chest pain in the setting of  known coronary artery disease and positive troponins.  While it is tempting  to think that the elevation of her troponins is not cardiac related, with  her known coronary artery disease and multiple risk factors and the known  association of inflammation as a triggering of acute coronary syndromes, I  think it is a little bit hard to pursue this for too long.   RECOMMENDATIONS:  1. Add Integrelin and use of __________ .  2. Recheck enzymes.  3. Continue nitroglycerin. 4. In the event that her symptoms are unrelieved, we will undertake cardiac     catheterization  urgently.  5.     Discontinue Rocephin and maintain Zithromax.  6. Continue beta blockers with some trepidation given the presence of     wheezing.  7. Check a BMP.  Duke Salvia, M.D. [facility]    SCK/MEDQ  D:  01/15/2003  T:  01/15/2003  Job:  [id number]   cc:   Angus G. Renard Matter, M.D.  [address]  [place]  [place] [address]  Fax: [phone]  [place], [place]

## 2011-05-10 NOTE — H&P (Signed)
NAME:  [name] [name], [name] [name]              ACCOUNT NO.:  [id number]   MEDICAL RECORD NO.:  [id number]           PATIENT TYPE:  INP   LOCATION:  A204                          FACILITY:  [facility]   PHYSICIAN:  Angus G. Renard Matter, MD   DATE OF BIRTH:  [birth year]   DATE OF ADMISSION:  DATE OF DISCHARGE:  LH                                HISTORY & PHYSICAL   This 75 year old white female presented to the ED with a chief complaint of  increased cough and dyspnea.  The patient been seen in the office previously  with cough and had been placed on Avelox 400 mg daily for the past few days.  The patient apparently had increasing difficulty with breathing.  She does  have a significant history of COPD.  She was seen and evaluated by ED  physician, was noted to be in some respiratory distress with wheezing and  dyspnea.  She was given albuterol and Atrovent neb treatment in the ED and  subsequently admitted.   PERTINENT LAB DATA:  CBC:  WBC 10,100 with hemoglobin 15, hematocrit 43.5%,  52% neutrophils.  Chemistries showed a sodium of 139, potassium 3.7,  chloride 106, CO2 26, glucose 276, BUN 7, creatinine 0.7, calcium 9.3, total  protein 6.1, albumin 3.6, SGOT 20, SGPT 28, alkaline phosphatase 76,  bilirubin 0.7.  A BNP was 33.5.   SOCIAL HISTORY:  The patient does not smoke or drink alcohol.   FAMILY HISTORY:  Unknown.   PAST MEDICAL AND SURGICAL HISTORY:  The patient has a history of asthma,  COPD, insulin-dependent diabetes, emphysema, hyperlipidemia, hypertension,  hypothyroidism.   ALLERGIES:  CODEINE.   MEDICATION LIST:  Cozaar, Synthroid, Lipitor, Lantus insulin 95 units at  bedtime, Avelox 4 mg daily, albuterol and Atrovent neb treatments as needed.   REVIEW OF SYSTEMS:  HEENT:  Negative.  CARDIOPULMONARY:  Chest pain.  Patient has had dyspnea with wheezing.  GI:  No bowel irregularity.  GU: No  dysuria, hematuria.   EXAMINATION:  VITAL SIGNS:  Alert female with blood pressure 127/60,  pulse  88, respiration 24.  HEENT: Eyes: PERRLA.  TM negative, oropharynx benign.  NECK: Supple.  No JVD or thyroid abnormalities  HEART: Regular rhythm.  No murmurs.  LUNGS: Rhonchi and wheezing bilaterally.  ABDOMEN: No palpable organs or masses.  EXTREMITIES:  Free of edema.  NEUROLOGIC:  No focal deficit.   DIAGNOSES:  Exacerbation of COPD and bronchitis, diabetes mellitus insulin  dependent, hypertension, hyperlipidemia, hypothyroidism.      Angus G. Renard Matter, MD  Electronically Signed     AGM/MEDQ  D:  09/11/2006  T:  09/11/2006  Job:  [id number]

## 2011-05-10 NOTE — Consult Note (Signed)
[place]  Patient:    [name] [name], [name] [name]                        MRN: [id number] Proc. Date: 07/01/00 Adm. Date:  [id number] Attending:  Mirian Mo Dictator:   [id number] CC:         Arturo Morton. Riley Kill, M.D. [facility]             Thomas C. Wall, M.D. [facility]             Angus McInnis, M.D./[place], [place]             CVTS office                          Consultation Report  REASON FOR CONSULTATION:  Severe one vessel coronary artery disease, restenosis  status post percutaneous intervention, Class 4 unstable angina.  HISTORY OF PRESENT ILLNESS:  [name] [name] is a morbidly obese 75 year old white female with a history of coronary artery disease, hypertension, type 2 diabetes  mellitus, hyperlipidemia and a previous history of tobacco exposure, as well as a family history of coronary artery disease.  She initially underwent cardiac catheterization in 1991, demonstrating insignificant coronary artery disease. n August, 2000, she presented with unstable angina, and was found to have high grade stenosis of the midleft anterior descending coronary artery and a large diagonal branch.  She underwent angioplasty and high speed rotational arthrectomy at that time.  She has had a total of five caths since that time, for recurrent symptoms of angina.  She underwent repeat high speed rotational arthrectomy and angioplasty of the LAD in November, 2000.  In January, 2001, she again presented with unstable  angina, and she underwent repeat angioplasty and stenting of the LAD and diagonal branch.  She did well initially, but she now returns with a two-week history of  progressive symptoms of unstable angina.  She describes classical symptoms of substernal chest pressure occurring at rest, and with minimal physical exertion. These are associated with shortness of breath and recently, they have been increasing in severity and frequency.  She has had chest pain nearly  every day or the last two weeks.  They are now at times radiating to the jaw, as well as to he back.  She was admitted on early Monday morning, 06/30/00 and serial cardiac enzymes ruled out for any sign of acute myocardial infarction.  Since hospital admission, she has continued to have intermittent episodes of chest pain at rest, despite intravenous heparin and Integrelin, as well as nitrates.  She was taken to the cath lab today, where diagnostic cardiac catheterization performed by Dr. Riley Kill, demonstrated  restenosis of the left anterior descending coronary artery, with greater than 95% stenosis of the midportion of this vessel within the stent, at the site of previous interventions.  The large diagonal branch is now barely even visible and is essentially subtotally occluded.  There is also 70% proximal stenosis of an optional diagonal branch.  There is insignificant atherosclerotic disease in the left circumflex, and right coronary systems.  The distal left anterior descending coronary artery, beyond the level which is diseased, tapers off and becomes small. It appears as though it may be intramyocardial during the majority of its course down the anterior wall of the left ventricle.  Towards the apex of the heart, it gets very small.  Overall left ventricular function appears well preserved, with  ejection fraction estimated at 72% and no significant wall motion abnormalities. [name] [name] was referred for possible surgical intervention.   REVIEW OF SYSTEMS:  [name] [name] describes symptoms of angina as reported previously. She denies any prolonged episodes of chest pain lasting more than five or ten minutes in duration.  She reports increasing dyspnea on exertion, although this is primarily associated with symptoms of angina.  She denies any history of resting shortness of breath, PND or orthopnea.  She does have some intermittent bilateral lower extremity edema.   She reports some symptoms of palpitations, but denies any history of syncope.  Her weight has been stable, although if anything, she may ave gained a few pounds over the last year.  She denies any problems with appetite r change in bowel habits.  She denies any recent history of hematochezia, melena r hematemesis.  She has had no recent history of fevers, chills or productive cough. She denies any dysuria.  She reports no problems with headaches or visual changes. She reports no recent history of transient neurologic deficits.  She denies any  history of claudication.  The remainder of her review of systems is unremarkable.  PAST MEDICAL HISTORY:  Notable for coronary artery disease, as discussed previously.  The patient has a history of hypertension, type 2 diabetes mellitus, for which she takes insulin on a daily basis and monitors her own blood sugars t least two or three times every day.  She has a history of hyperlipidemia, hiatal hernia, benign thyroid goiter and a history of longstanding tobacco exposure. he has never been a smoker but her husband was a heavy smoker and she was exposed o secondhand smoke during the majority of their married life.  The patient reports a history of a "mini-stroke" several years ago; she is not sure, but apparently, he had a formal work-up and at that time, had a transient episode lasting one or two days, where a portion of one side of her face did not move correctly.  She was old at that time that was the first time she was noted to have hypertension.  She has never had any such neurologic episode since.  PAST SURGICAL HISTORY:  Notable for previous cholecystectomy, previous appendectomy, previous hysterectomy, previous lumbar disc surgery and previous bilateral carpal tunnel release.  SOCIAL HISTORY:  The patient lives alone, although she has six children - all of whom live near by; one of her sons spends a great deal of time with  her.  She denies any history of alcohol use and she has never been a smoker.   FAMILY HISTORY:  Notable for her father who passed away from a myocardial infarction.  CURRENT MEDICATIONS:  Prior to admission - Lipitor, Toprol, Avandia, Premarin, Synthroid, Altace, Lasix, enteric-coated aspirin and occasional Xanax.  The patient uses 70/30 Humulin insulin 55 units every morning.  ALLERGIES:  She reports her only drug sensitivity is to CODEINE, which causes GI upset.  PHYSICAL EXAMINATION:  GENERAL:  Notable for a well-appearing but obese white female in no acute distress.  SKIN:  Dry and clear and appears healthy.  HEENT:  Examination is grossly within normal limits.  NECK:  Supple; there is no cervical lymphadenopathy, no jugular venous distention appreciated and no carotid bruits are identified.  CHEST:  Auscultation demonstrates equal symmetric breath sounds.  No wheezes or  rhonchi are noted.  CARDIOVASCULAR:  Exam demonstrates a regular rate and rhythm; no murmurs, rubs r gallops are noted.  ABDOMEN:  Soft,  obese and nontender.  The liver edge cannot be palpated.  There are no obvious masses.  EXTREMITIES:  Warm and well-perfused.  There is no lower extremity edema. Distal pulses are palpable in both lower legs.  There is no significant venous insufficiency.  There is no peripheral edema.  NEUROLOGIC:  Examination is grossly symmetrical throughout.  RECTAL/GU:  Exams are deferred.  LABORATORY DATA:  This hospital admission - notable for white blood count of 7,600, hemoglobin 15.1, hematocrit 44% and platelet count 264,000.  Comprehensive Metabolic Panel:  Essentially within normal limits, including serum sodium 138, potassium 3.9, chloride 102, bicarb 25, BUN 0, creatinine 0.6, and glucose 141.  Coagulation profile:  Within normal limits.  Serial cardiac enzymes:  Negative.  Electrocardiogram demonstrates normal sinus rhythm with previous  anterior myocardial infarction.  IMPRESSION:  Severe one vessel coronary artery disease with high-grade restenosis at site of percutaneous intervention on numerous occasions in the left anterior  descending coronary artery, involving a large diagonal branch.  Overall left ventricular function appears well-preserved.  There is no question that I believe that [name] [name] would likely benefit from coronary artery bypass grafting, although her distal left anterior descending coronary artery appears as though it may be a relatively poor target for bypass grafting.  She would certainly not be a candidate for an attempt at off-pump coronary bypass grafting due to the relatively poor sites as targets for bypass grafting in my opinion.  Her numerous comorbid conditions add to her operative risks.  PLAN:  We plan to proceed with coronary artery bypass grafting tomorrow.  This needs to be performed relatively promptly, as [name] [name] has continued to have episodes of unstable angina in the hospital, despite intravenous heparin, Integrelin and nitrate therapy.  I have outlined at length the indications, risks and potential benefits of surgery with Ms. Walker.  All of her questions have been addressed.  She understands all associated risks of surgery, including but not limited to - the risk of death, stroke, myocardial infarction, bleeding requiring blood transfusion, arrhythmia, infection and recurrent coronary artery disease.  She also understands the relatively increased chances for decreased long-term graft patency or early  cardiac event due to the relatively poor distal targets for bypass grafting in he LAD distribution. DD:  07/01/00 TD:  07/01/00 Job: [id number]

## 2011-05-10 NOTE — Group Therapy Note (Signed)
NAME:  [name] [name], [name] [name]              ACCOUNT NO.:  [id number]   MEDICAL RECORD NO.:  [id number]          PATIENT TYPE:  INP   LOCATION:  A204                          FACILITY:  [facility]   PHYSICIAN:  Angus G. Renard Matter, MD   DATE OF BIRTH:  10-29-1936   DATE OF PROCEDURE:  DATE OF DISCHARGE:                                   PROGRESS NOTE   This patient was admitted with cough, dyspnea, exacerbation of COPD, and  insulin-dependent diabetes.   OBJECTIVE:  VITAL SIGNS:  Blood pressure 142/90, respirations 22, pulse 98,  temperature 98.2.  Blood sugar 276.  LUNGS:  Rhonchi and scattered wheezes bilaterally.  HEART:  Regular rhythm.  ABDOMEN:  No palpable organs or masses.   ASSESSMENT:  The patient will be admitted with exacerbation of chronic  obstructive pulmonary disease, bronchitis, poorly controlled diabetes.   PLAN:  Plan to continue current regimen.      Angus G. Renard Matter, MD  Electronically Signed     AGM/MEDQ  D:  09/11/2006  T:  09/12/2006  Job:  [id number]

## 2011-05-10 NOTE — Group Therapy Note (Signed)
   NAME:  [name] [name], [name] [name]                           ACCOUNT NO.:  [id number]   MEDICAL RECORD NO.:  [id number]                   PATIENT TYPE:  INP   LOCATION:                                       FACILITY:   PHYSICIAN:  Edward L. Juanetta Gosling, M.D.             DATE OF BIRTH:  01-24-1936   DATE OF PROCEDURE:  01/15/2003  DATE OF DISCHARGE:                                   PROGRESS NOTE   PROBLEMS:  Chest pain, pneumonia, history of coronary artery occlusive  disease, diabetes.   SUBJECTIVE:  The patient said she had been having more chest discomfort.  She has had a sensation of pressure-like sensation in her chest.  She is  still coughing some as well.   OBJECTIVE:  Her physical examination today shows that her temperature is  98.2, pulse 52, respirations 18, blood pressure 123/59, blood sugar 206 (it  has been as high as 366), O2 saturations 95% on 2 L of O2, weight is listed  at 146 which is incorrect.  Her chest is relatively clear.  Her I&O +992.  Her cardiac panel shows CK 160, MB 4.3, index 2.7 with a troponin of 0.55.   ASSESSMENT AND PLAN:  She does have coronary artery occlusive disease and  has had multiple cardiac interventions.  I have discussed her situation with  her and with Dr. Berton Mount of [facility].  We discussed that there  are two options - one is to transfer to her to [facility] to  continue her care there, the other is to have her take nitroglycerin drip,  heparin drip, and possibly Integrilin here, follow her cardiac enzymes  further, recheck EKG, continue all the other medications and then assuming  that she does well have her transferred to [facility] on Monday for cardiac  catheterization.  She requests to go ahead and move to [facility] now.  She is  concerned about impending weather situations which are forecast about the  possibility that she may have complications here.  At this point, I am going  to go ahead and arrange transfer.  I have discussed  this with Dr. Graciela Husbands and  he agrees to accept her in transfer.                                               Edward L. Juanetta Gosling, M.D.    ELH/MEDQ  D:  01/15/2003  T:  01/16/2003  Job:  [id number]

## 2011-05-10 NOTE — Cardiovascular Report (Signed)
[place]  Patient:    [name] [name], [name] [name]                        MRN: [id number] Proc. Date: 07/01/00 Adm. Date:  [id number] Attending:  Tressie Stalker CC:         Arturo Morton. Riley Kill, M.D. [facility]             CV Laboratory             Butch Penny, M.D.             Thomas C. Wall, M.D. [facility]                        Cardiac Catheterization  INDICATIONS:  [name] presents with recurrent angina suggestive of ischemic pain.  The current study was done to access coronary anatomy.  The patient has had prior percutaneous coronary intervention with bifurcational stenting of the LAD diagonal.  PROCEDURES:  Left heart catheterization, selective coronary angiography, selective left ventriculography, subclavian angiography.  DESCRIPTION OF PROCEDURE:  The procedure was performed from the right femoral artery.  She was given a low-dose of morphine because of back pain.  She tolerated the procedure without complication.  HEMODYNAMICS:  The central aortic pressure was 104/58.  LV pressure was 120/30.  There was no gradient on pullback across the aortic valve.  Subclavian angiography revealed a widely patent subclavian with patent internal mammary.  The left main coronary artery was free of critical disease.  The LAD demonstrates extensive in-stent re-stenosis involving both the LAD and diagonal.  Both vessels appear to be suitable for grafting.  It is hard to know what the extent of disease in the distal LAD is.  Nonetheless there is flow into the distal LAD.  There is a ramus intermedius with about 50-70% proximal segmental plaquing. This was a modest size vessel.  The circumflex consists of a large multiply bifurcating marginal branch that courses out over the lateral wall.  There is minor luminal irregularity at the ostium and minimal luminal irregularities at the vessel but no high-grade stenoses.  The right coronary artery is a dominant vessel.  It  provides the posterior descending and posterolateral system.  The vessel appears to be free of obstructive disease.  LEFT VENTRICULOGRAPHY:  Ventriculography in the RAO projection reveals vigorous global systolic function, ejection fraction of 72%.  CONCLUSIONS: 1. Normal left ventricular function. 2. High-grade in-stent re-stenosis at the bifurcation involving the lad    and diagonal as described above. 3. A 70% proximal ostial involvement of the intermediate vessel. 4. No significant stenosis of the circumflex or RCA. 5. Widely patent subclavian and internal mammary.  DISPOSITION:  Surgical consultation will be obtained.  The patient would likely need revascularization surgery after multiple attempts at percutaneous revascularization. DD:  07/01/00 TD:  07/02/00 Job: [id number]

## 2011-05-10 NOTE — Group Therapy Note (Signed)
NAME:  [name] [name], [name] [name]              ACCOUNT NO.:  [id number]   MEDICAL RECORD NO.:  [id number]          PATIENT TYPE:  INP   LOCATION:  A211                          FACILITY:  [facility]   PHYSICIAN:  Angus G. Renard Matter, MD   DATE OF BIRTH:  1936/06/08   DATE OF PROCEDURE:  DATE OF DISCHARGE:                                 PROGRESS NOTE   SUBJECTIVE:  This patient has been treated for chronic obstructive  pulmonary disease and bronchitis.  She does have insulin-dependent  diabetes aggravated by steroids, history of coronary artery disease, CHF  and hypothyroidism.  She continues to have some difficulty with her  breathing, but overall her condition seems some better.   OBJECTIVE:  VITAL SIGNS:  Blood pressure 142/72, respirations 20, pulse  114, temperature 96.8, pulse 60.  LUNGS:  Diminished breath sounds.  HEART:  Regular rhythm.  ABDOMEN:  No palpable organs or masses.   ASSESSMENT:  The patient was admitted with above-stated problems.  She  was transitioned to oral prednisone from IV Solu-Medrol.   PLAN:  To continue current regimen.  Will attempt to get the patient  discharged in the near future.      Angus G. Renard Matter, MD  Electronically Signed     AGM/MEDQ  D:  02/24/2007  T:  02/24/2007  Job:  [id number]

## 2011-05-24 ENCOUNTER — Emergency Department (HOSPITAL_COMMUNITY)
Admission: EM | Admit: 2011-05-24 | Discharge: 2011-05-24 | Disposition: A | Discharge status: Home / Self Care | Payer: Medicare Other | Attending: Emergency Medicine | Admitting: Emergency Medicine

## 2011-05-24 DIAGNOSIS — Z79899 Other long term (current) drug therapy: Secondary | ICD-10-CM | POA: Insufficient documentation

## 2011-05-24 DIAGNOSIS — N390 Urinary tract infection, site not specified: Secondary | ICD-10-CM | POA: Insufficient documentation

## 2011-05-24 DIAGNOSIS — I10 Essential (primary) hypertension: Secondary | ICD-10-CM | POA: Insufficient documentation

## 2011-05-24 DIAGNOSIS — Z8679 Personal history of other diseases of the circulatory system: Secondary | ICD-10-CM | POA: Insufficient documentation

## 2011-05-24 DIAGNOSIS — E119 Type 2 diabetes mellitus without complications: Secondary | ICD-10-CM | POA: Insufficient documentation

## 2011-05-24 DIAGNOSIS — I2510 Atherosclerotic heart disease of native coronary artery without angina pectoris: Secondary | ICD-10-CM | POA: Insufficient documentation

## 2011-05-24 DIAGNOSIS — B029 Zoster without complications: Secondary | ICD-10-CM | POA: Insufficient documentation

## 2011-05-24 DIAGNOSIS — G8929 Other chronic pain: Secondary | ICD-10-CM | POA: Insufficient documentation

## 2011-05-24 LAB — DIFFERENTIAL
Basophils Absolute: 0 10*3/uL (ref 0.0–0.1)
Basophils Relative: 1 % (ref 0–1)
Lymphocytes Relative: 31 % (ref 12–46)
Monocytes Relative: 12 % (ref 3–12)
Neutro Abs: 3.8 10*3/uL (ref 1.7–7.7)
Neutrophils Relative %: 50 % (ref 43–77)

## 2011-05-24 LAB — URINALYSIS, ROUTINE W REFLEX MICROSCOPIC
Bilirubin Urine: NEGATIVE
Ketones, ur: NEGATIVE mg/dL
Nitrite: NEGATIVE
Protein, ur: NEGATIVE mg/dL
pH: 6.5 (ref 5.0–8.0)

## 2011-05-24 LAB — CBC
MCH: 29.6 pg (ref 26.0–34.0)
MCHC: 33 g/dL (ref 30.0–36.0)
MCV: 89.7 fL (ref 78.0–100.0)
Platelets: 216 10*3/uL (ref 150–400)
RBC: 4.93 MIL/uL (ref 3.87–5.11)

## 2011-05-24 LAB — BASIC METABOLIC PANEL
Calcium: 10 mg/dL (ref 8.4–10.5)
Chloride: 102 mEq/L (ref 96–112)
Creatinine, Ser: 0.57 mg/dL (ref 0.4–1.2)
GFR calc Af Amer: >60 mL/min (ref >60)
Sodium: 138 mEq/L (ref 135–145)

## 2011-05-27 ENCOUNTER — Emergency Department (HOSPITAL_COMMUNITY)
Admission: EM | Admit: 2011-05-27 | Discharge: 2011-05-27 | Disposition: A | Discharge status: Home / Self Care | Payer: Medicare Other | Attending: Emergency Medicine | Admitting: Emergency Medicine

## 2011-05-27 DIAGNOSIS — I2510 Atherosclerotic heart disease of native coronary artery without angina pectoris: Secondary | ICD-10-CM | POA: Insufficient documentation

## 2011-05-27 DIAGNOSIS — Y92009 Unspecified place in unspecified non-institutional (private) residence as the place of occurrence of the external cause: Secondary | ICD-10-CM | POA: Insufficient documentation

## 2011-05-27 DIAGNOSIS — E119 Type 2 diabetes mellitus without complications: Secondary | ICD-10-CM | POA: Insufficient documentation

## 2011-05-27 DIAGNOSIS — I10 Essential (primary) hypertension: Secondary | ICD-10-CM | POA: Insufficient documentation

## 2011-05-27 DIAGNOSIS — Z8679 Personal history of other diseases of the circulatory system: Secondary | ICD-10-CM | POA: Insufficient documentation

## 2011-05-27 DIAGNOSIS — T50901A Poisoning by unspecified drugs, medicaments and biological substances, accidental (unintentional), initial encounter: Secondary | ICD-10-CM | POA: Insufficient documentation

## 2011-05-27 DIAGNOSIS — G8929 Other chronic pain: Secondary | ICD-10-CM | POA: Insufficient documentation

## 2011-05-27 LAB — URINE CULTURE: Colony Count: >=100000

## 2011-08-28 ENCOUNTER — Encounter: Payer: Self-pay | Admitting: *Deleted

## 2011-08-28 ENCOUNTER — Inpatient Hospital Stay (HOSPITAL_COMMUNITY)
Admission: EM | Admit: 2011-08-28 | Discharge: 2011-08-31 | DRG: 303 | Disposition: A | Discharge status: Home / Self Care | Payer: Medicare Other | Attending: Family Medicine | Admitting: Family Medicine

## 2011-08-28 ENCOUNTER — Other Ambulatory Visit: Payer: Self-pay

## 2011-08-28 ENCOUNTER — Emergency Department (HOSPITAL_COMMUNITY): Payer: Medicare Other

## 2011-08-28 DIAGNOSIS — I2510 Atherosclerotic heart disease of native coronary artery without angina pectoris: Secondary | ICD-10-CM | POA: Insufficient documentation

## 2011-08-28 DIAGNOSIS — E039 Hypothyroidism, unspecified: Secondary | ICD-10-CM | POA: Insufficient documentation

## 2011-08-28 DIAGNOSIS — E118 Type 2 diabetes mellitus with unspecified complications: Secondary | ICD-10-CM | POA: Insufficient documentation

## 2011-08-28 DIAGNOSIS — I252 Old myocardial infarction: Secondary | ICD-10-CM

## 2011-08-28 DIAGNOSIS — I509 Heart failure, unspecified: Secondary | ICD-10-CM | POA: Diagnosis present

## 2011-08-28 DIAGNOSIS — E785 Hyperlipidemia, unspecified: Secondary | ICD-10-CM | POA: Insufficient documentation

## 2011-08-28 DIAGNOSIS — R079 Chest pain, unspecified: Secondary | ICD-10-CM

## 2011-08-28 DIAGNOSIS — G4730 Sleep apnea, unspecified: Secondary | ICD-10-CM | POA: Insufficient documentation

## 2011-08-28 DIAGNOSIS — I200 Unstable angina: Secondary | ICD-10-CM | POA: Diagnosis present

## 2011-08-28 DIAGNOSIS — R0789 Other chest pain: Secondary | ICD-10-CM | POA: Diagnosis present

## 2011-08-28 DIAGNOSIS — E119 Type 2 diabetes mellitus without complications: Secondary | ICD-10-CM | POA: Diagnosis present

## 2011-08-28 DIAGNOSIS — E669 Obesity, unspecified: Secondary | ICD-10-CM | POA: Insufficient documentation

## 2011-08-28 DIAGNOSIS — T463X5A Adverse effect of coronary vasodilators, initial encounter: Secondary | ICD-10-CM | POA: Diagnosis present

## 2011-08-28 DIAGNOSIS — I10 Essential (primary) hypertension: Secondary | ICD-10-CM | POA: Diagnosis present

## 2011-08-28 DIAGNOSIS — G4440 Drug-induced headache, not elsewhere classified, not intractable: Secondary | ICD-10-CM | POA: Diagnosis present

## 2011-08-28 DIAGNOSIS — Z951 Presence of aortocoronary bypass graft: Secondary | ICD-10-CM

## 2011-08-28 DIAGNOSIS — I5032 Chronic diastolic (congestive) heart failure: Secondary | ICD-10-CM | POA: Diagnosis present

## 2011-08-28 HISTORY — DX: Asymptomatic varicose veins of unspecified lower extremity: I83.90

## 2011-08-28 HISTORY — DX: Hypothyroidism, unspecified: E03.9

## 2011-08-28 LAB — CBC
HCT: 41.6 % (ref 36.0–46.0)
Hemoglobin: 14 g/dL (ref 12.0–15.0)
MCH: 30.7 pg (ref 26.0–34.0)
MCHC: 33.7 g/dL (ref 30.0–36.0)

## 2011-08-28 LAB — BASIC METABOLIC PANEL
BUN: 10 mg/dL (ref 6–23)
Calcium: 9 mg/dL (ref 8.4–10.5)
Creatinine, Ser: 0.62 mg/dL (ref 0.50–1.10)
GFR calc Af Amer: >60 mL/min (ref >60)
GFR calc non Af Amer: >60 mL/min (ref >60)

## 2011-08-28 LAB — DIFFERENTIAL
Basophils Relative: 0 % (ref 0–1)
Eosinophils Absolute: 0.5 10*3/uL (ref 0.0–0.7)
Monocytes Absolute: 0.9 10*3/uL (ref 0.1–1.0)
Monocytes Relative: 9 % (ref 3–12)

## 2011-08-28 LAB — GLUCOSE, CAPILLARY: Glucose-Capillary: 244 mg/dL — ABNORMAL HIGH (ref 70–99)

## 2011-08-28 LAB — POCT I-STAT TROPONIN I: Troponin i, poc: 0 ng/mL (ref 0.00–0.08)

## 2011-08-28 MED ORDER — TRIMETHOPRIM 100 MG PO TABS
100.0000 mg | ORAL_TABLET | Freq: Every day | ORAL | Status: DC
  Administered 2011-08-29 – 2011-08-30 (×2): 100 mg via ORAL
  Filled 2011-08-28 (×5): qty 1

## 2011-08-28 MED ORDER — POLYETHYLENE GLYCOL 3350 17 G PO PACK
17.0000 g | PACK | Freq: Every day | ORAL | Status: DC
  Administered 2011-08-29 – 2011-08-31 (×3): 17 g via ORAL
  Filled 2011-08-28 (×3): qty 1

## 2011-08-28 MED ORDER — LEVOTHYROXINE SODIUM 25 MCG PO TABS
25.0000 ug | ORAL_TABLET | Freq: Every day | ORAL | Status: DC
  Administered 2011-08-28 – 2011-08-31 (×4): 25 ug via ORAL
  Filled 2011-08-28 (×4): qty 1

## 2011-08-28 MED ORDER — ENALAPRIL MALEATE 10 MG PO TABS
10.0000 mg | ORAL_TABLET | Freq: Every day | ORAL | Status: DC
  Filled 2011-08-28 (×3): qty 1

## 2011-08-28 MED ORDER — NITROGLYCERIN 0.4 MG SL SUBL
0.4000 mg | SUBLINGUAL_TABLET | Freq: Once | SUBLINGUAL | Status: AC
  Administered 2011-08-28: 0.4 mg via SUBLINGUAL

## 2011-08-28 MED ORDER — SODIUM CHLORIDE 0.9 % IV SOLN
INTRAVENOUS | Status: DC
  Administered 2011-08-28 – 2011-08-30 (×2): via INTRAVENOUS

## 2011-08-28 MED ORDER — SODIUM CHLORIDE 0.9 % IV SOLN
Freq: Once | INTRAVENOUS | Status: AC
  Administered 2011-08-28: 1000 mL via INTRAVENOUS

## 2011-08-28 MED ORDER — INSULIN GLARGINE 100 UNIT/ML ~~LOC~~ SOLN
20.0000 [IU] | Freq: Every day | SUBCUTANEOUS | Status: DC
  Administered 2011-08-28 – 2011-08-30 (×3): 20 [IU] via SUBCUTANEOUS
  Filled 2011-08-28: qty 3
  Filled 2011-08-28: qty 10

## 2011-08-28 MED ORDER — ACETAMINOPHEN 325 MG PO TABS
650.0000 mg | ORAL_TABLET | Freq: Four times a day (QID) | ORAL | Status: DC | PRN
  Administered 2011-08-30 – 2011-08-31 (×3): 650 mg via ORAL
  Filled 2011-08-28 (×4): qty 2

## 2011-08-28 MED ORDER — ALUM & MAG HYDROXIDE-SIMETH 200-200-20 MG/5ML PO SUSP: 30.0000 mL | Freq: Four times a day (QID) | ORAL | Status: DC | PRN

## 2011-08-28 MED ORDER — NITROGLYCERIN 0.4 MG SL SUBL: 0.4000 mg | SUBLINGUAL_TABLET | SUBLINGUAL | Status: DC | PRN

## 2011-08-28 MED ORDER — NITROGLYCERIN IN D5W 200-5 MCG/ML-% IV SOLN
2.0000 ug/min | INTRAVENOUS | Status: DC
  Administered 2011-08-28: 5 ug/min via INTRAVENOUS
  Filled 2011-08-28: qty 250

## 2011-08-28 MED ORDER — MORPHINE SULFATE 4 MG/ML IJ SOLN
4.0000 mg | Freq: Once | INTRAMUSCULAR | Status: AC
  Administered 2011-08-28: 4 mg via INTRAVENOUS
  Filled 2011-08-28: qty 1

## 2011-08-28 MED ORDER — ONDANSETRON HCL 4 MG/2ML IJ SOLN
4.0000 mg | Freq: Four times a day (QID) | INTRAMUSCULAR | Status: DC | PRN
  Administered 2011-08-29 – 2011-08-30 (×2): 4 mg via INTRAVENOUS
  Filled 2011-08-28 (×3): qty 2

## 2011-08-28 MED ORDER — MORPHINE SULFATE 2 MG/ML IJ SOLN
2.0000 mg | INTRAMUSCULAR | Status: DC | PRN
  Administered 2011-08-28 – 2011-08-30 (×7): 2 mg via INTRAVENOUS
  Filled 2011-08-28 (×8): qty 1

## 2011-08-28 MED ORDER — ASPIRIN EC 325 MG PO TBEC
325.0000 mg | DELAYED_RELEASE_TABLET | Freq: Every day | ORAL | Status: DC
  Administered 2011-08-29 – 2011-08-31 (×3): 325 mg via ORAL
  Filled 2011-08-28 (×3): qty 1

## 2011-08-28 MED ORDER — INSULIN ASPART 100 UNIT/ML ~~LOC~~ SOLN
0.0000 [IU] | Freq: Every day | SUBCUTANEOUS | Status: DC
  Administered 2011-08-29 – 2011-08-30 (×2): 2 [IU] via SUBCUTANEOUS
  Filled 2011-08-28: qty 3
  Filled 2011-08-28: qty 10
  Filled 2011-08-28: qty 3

## 2011-08-28 MED ORDER — ZOLPIDEM TARTRATE 5 MG PO TABS
5.0000 mg | ORAL_TABLET | Freq: Every evening | ORAL | Status: DC | PRN
  Administered 2011-08-29 – 2011-08-30 (×3): 5 mg via ORAL
  Filled 2011-08-28 (×3): qty 1

## 2011-08-28 MED ORDER — NITROGLYCERIN 0.4 MG SL SUBL: 0.4000 mg | SUBLINGUAL_TABLET | Freq: Once | SUBLINGUAL | Status: DC

## 2011-08-28 MED ORDER — ACETAMINOPHEN 650 MG RE SUPP: 650.0000 mg | Freq: Four times a day (QID) | RECTAL | Status: DC | PRN

## 2011-08-28 MED ORDER — INSULIN ASPART 100 UNIT/ML ~~LOC~~ SOLN
0.0000 [IU] | Freq: Three times a day (TID) | SUBCUTANEOUS | Status: DC
  Administered 2011-08-29: 7 [IU] via SUBCUTANEOUS
  Administered 2011-08-29 (×2): 4 [IU] via SUBCUTANEOUS
  Administered 2011-08-30: 11 [IU] via SUBCUTANEOUS
  Administered 2011-08-30: 4 [IU] via SUBCUTANEOUS
  Administered 2011-08-31: 7 [IU] via SUBCUTANEOUS
  Filled 2011-08-28: qty 10

## 2011-08-28 MED ORDER — SODIUM CHLORIDE 0.9 % IJ SOLN
INTRAMUSCULAR | Status: AC
  Administered 2011-08-28
  Filled 2011-08-28: qty 10

## 2011-08-28 MED ORDER — ASPIRIN 81 MG PO CHEW
324.0000 mg | CHEWABLE_TABLET | Freq: Once | ORAL | Status: AC
  Administered 2011-08-28: 324 mg via ORAL
  Filled 2011-08-28: qty 4

## 2011-08-28 MED ORDER — NITROGLYCERIN 0.4 MG SL SUBL
0.4000 mg | SUBLINGUAL_TABLET | Freq: Once | SUBLINGUAL | Status: AC
  Administered 2011-08-28: 0.4 mg via SUBLINGUAL
  Filled 2011-08-28: qty 25

## 2011-08-28 MED ORDER — ONDANSETRON HCL 4 MG PO TABS: 4.0000 mg | ORAL_TABLET | Freq: Four times a day (QID) | ORAL | Status: DC | PRN

## 2011-08-28 MED ORDER — ROSUVASTATIN CALCIUM 20 MG PO TABS
20.0000 mg | ORAL_TABLET | Freq: Every day | ORAL | Status: DC
  Administered 2011-08-28 – 2011-08-30 (×3): 20 mg via ORAL
  Filled 2011-08-28 (×4): qty 1

## 2011-08-28 NOTE — ED Notes (Signed)
EDP notified pt pain is unchanged after nitro.

## 2011-08-28 NOTE — ED Notes (Signed)
Pt denies pain at this time. States her cp has been on & off for the last 2 week. Lab in drawing blood at this time.

## 2011-08-28 NOTE — ED Notes (Signed)
Pt given a sandwich meal at this time.

## 2011-08-28 NOTE — H&P (Signed)
[name] [name] MRN: [id number] DOB/AGE: 73-27-1937 73 y.o. Primary Care Physician:MCINNIS,ANGUS G, MD Admit date: 08/28/2011 Chief Complaint: Chest pain HPI: This is a 75 year old who has a known history of cardiac disease who has been having pain on the left side of her chest for about 2 weeks. She says it has been associated with shortness of breath and diaphoresis but not with nausea. She has had a myocardial infarction in the past and says the pain that she is having is similar to what she had when she had an MI. She had bypass grafting in 2007. She has been following with Dr. Dietrich Pates the cardiologist but has not seen him in about a year and has an appointment to be seen fairly soon. She has taken some nitroglycerin which has helped. She has a headache now from taking nitroglycerin. The pain is generally exertional  Past Medical History  Diagnosis Date  . Acute MI   . Diabetes mellitus   . Hypertension   . Heart disease   . Apnea, sleep    Past Surgical History  Procedure Date  . Coronary artery bypass graft   . Arm surgery   . Appendectomy   . Cholecystectomy   . Abdominal hysterectomy   . Back surgery         History reviewed. No pertinent family history. There is some family history of cardiac disease but I'm not certain of the extent of that Social History:  reports that she has never smoked. She does not have any smokeless tobacco history on file. She reports that she does not drink alcohol or use illicit drugs. She lives at home with family  Allergies:  Allergies  Allergen Reactions  . Codeine Itching    Medications Prior to Admission  Medication Dose Route Frequency Provider Last Rate Last Dose  . 0.9 %  sodium chloride infusion   Intravenous Once Donnetta Hutching, MD 75 mL/hr at 08/28/11 2032 1,000 mL at 08/28/11 2032  . aspirin chewable tablet 324 mg  324 mg Oral Once Donnetta Hutching, MD   324 mg at 08/28/11 1837  . morphine injection 4 mg  4 mg Intravenous Once Donnetta Hutching, MD   4 mg at 08/28/11 2031  . nitroGLYCERIN (NITROSTAT) SL tablet 0.4 mg  0.4 mg Sublingual Once Donnetta Hutching, MD   0.4 mg at 08/28/11 1837  . nitroGLYCERIN (NITROSTAT) SL tablet 0.4 mg  0.4 mg Sublingual Once Donnetta Hutching, MD   0.4 mg at 08/28/11 1953  . DISCONTD: nitroGLYCERIN (NITROSTAT) SL tablet 0.4 mg  0.4 mg Sublingual Once Donnetta Hutching, MD       No current outpatient prescriptions on file as of 08/28/2011.       WUJ:WJXBJ from the symptoms mentioned above,there are no other symptoms referable to all systems reviewed.  Physical Exam: Blood pressure 136/86, pulse 59, temperature 98 F (36.7 C), temperature source Oral, resp. rate 18, height 5\' 4"  (1.626 m), weight 94.348 kg (208 lb), SpO2 94.00%. She is moderately obese. She says she still having pain now but does not appear to be in any acute distress. Her HEENT examination shows her pupils are reactive to light and accommodation. Her nose and throat are clear. Her neck is supple without masses. Her chest is clear without wheezes rales or rhonchi. Her heart is regular I do not sure of murmur gallop or rub. Her abdomen is soft obese without masses. Her bowel sounds present and active. Her extremities showed no clubbing cyanosis or edema. Her  central nervous system examination is grossly intact.  Results for orders placed during the hospital encounter of 08/28/11 (from the past 48 hour(s))  POCT I-STAT TROPONIN I     Status: Normal   Collection Time   08/28/11  5:48 PM      Component Value Range Comment   Troponin i, poc 0.00  0.00 - 0.08 (ng/mL)    Comment 3            CBC     Status: Normal   Collection Time   08/28/11  7:00 PM      Component Value Range Comment   WBC 10.2  4.0 - 10.5 (K/uL)    RBC 4.56  3.87 - 5.11 (MIL/uL)    Hemoglobin 14.0  12.0 - 15.0 (g/dL)    HCT 16.1  09.6 - 04.5 (%)    MCV 91.2  78.0 - 100.0 (fL)    MCH 30.7  26.0 - 34.0 (pg)    MCHC 33.7  30.0 - 36.0 (g/dL)    RDW 40.9  81.1 - 91.4 (%)    Platelets 219   150 - 400 (K/uL)   DIFFERENTIAL     Status: Normal   Collection Time   08/28/11  7:00 PM      Component Value Range Comment   Neutrophils Relative 51  43 - 77 (%)    Neutro Abs 5.2  1.7 - 7.7 (K/uL)    Lymphocytes Relative 35  12 - 46 (%)    Lymphs Abs 3.6  0.7 - 4.0 (K/uL)    Monocytes Relative 9  3 - 12 (%)    Monocytes Absolute 0.9  0.1 - 1.0 (K/uL)    Eosinophils Relative 4  0 - 5 (%)    Eosinophils Absolute 0.5  0.0 - 0.7 (K/uL)    Basophils Relative 0  0 - 1 (%)    Basophils Absolute 0.0  0.0 - 0.1 (K/uL)   BASIC METABOLIC PANEL     Status: Abnormal   Collection Time   08/28/11  7:00 PM      Component Value Range Comment   Sodium 136  135 - 145 (mEq/L)    Potassium 3.6  3.5 - 5.1 (mEq/L)    Chloride 100  96 - 112 (mEq/L)    CO2 26  19 - 32 (mEq/L)    Glucose, Bld 240 (*) 70 - 99 (mg/dL)    BUN 10  6 - 23 (mg/dL)    Creatinine, Ser 7.82  0.50 - 1.10 (mg/dL)    Calcium 9.0  8.4 - 10.5 (mg/dL)    GFR calc non Af Amer >60  >60 (mL/min)    GFR calc Af Amer >60  >60 (mL/min)   CARDIAC PANEL(CRET KIN+CKTOT+MB+TROPI)     Status: Normal   Collection Time   08/28/11  7:00 PM      Component Value Range Comment   Total CK 108  7 - 177 (U/L)    CK, MB 2.6  0.3 - 4.0 (ng/mL)    Troponin I <0.30  <0.30 (ng/mL)    Relative Index 2.4  0.0 - 2.5     No results found for this or any previous visit (from the past 240 hour(s)).  Dg Chest Portable 1 View  08/28/2011  *RADIOLOGY REPORT*  Clinical Data: Chest pain, shortness of breath  PORTABLE CHEST - 1 VIEW  Comparison: 03/14/2011  Findings: Previous CABG.  Fixation hardware in the right humerus partially seen.  Low lung volumes.  No confluent airspace infiltrate or overt edema.  Heart size upper limits normal.  No effusion.  IMPRESSION:  1.  Stable postoperative changes.  No acute disease.  Original Report Authenticated By: Osa Craver, M.D.   Impression: She has chest pain which is reminiscent of her cardiac event. She has known  coronary artery occlusive disease with previous bypass grafting and previous myocardial infarction. Thus far her EKG and cardiac enzymes did not show evidence of acute infarction. Active Problems:  * No active hospital problems. *      Plan: She will be admitted to the intensive care unit started on IV nitroglycerin continue with all the other treatments and have cardiology consultation in the morning      Leatha Rohner L 08/28/2011, 9:09 PM

## 2011-08-28 NOTE — ED Provider Notes (Addendum)
History     CSN: [id number] Arrival date & time: 08/28/2011  5:27 PM  Chief Complaint  Patient presents with  . Chest Pain   HPI 75 year old white female complains of achiness in her left chest for 2 weeks. In radiates to left shoulder left arm and left upper back. slight shortness of breath and diaphoresis.  status post CABG in 2007. Patient has also had a myocardial infarction in the past. Risk factors include diabetes and hypertension. Secondary risk factor obesity has tried nitroglycerin at home with minimal relief from pain. Sees Dr. Dietrich Pates for cardiology. level V caveat is urgent need for intervention Past Medical History  Diagnosis Date  . Acute MI   . Diabetes mellitus   . Hypertension   . Heart disease   . Apnea, sleep     Past Surgical History  Procedure Date  . Coronary artery bypass graft   . Arm surgery   . Appendectomy   . Cholecystectomy   . Abdominal hysterectomy   . Back surgery     History reviewed. No pertinent family history.  History  Substance Use Topics  . Smoking status: Never Smoker   . Smokeless tobacco: Not on file  . Alcohol Use: No    OB History    Grav Para Term Preterm Abortions TAB SAB Ect Mult Living                  Review of Systems  All other systems reviewed and are negative.    Physical Exam  BP 130/61  Pulse 61  Temp(Src) 98 F (36.7 C) (Oral)  Resp 18  Ht 5\' 4"  (1.626 m)  Wt 208 lb (94.348 kg)  BMI 35.70 kg/m2  SpO2 96%  Physical Exam  Nursing note and vitals reviewed. Constitutional: She is oriented to person, place, and time. She appears well-developed and well-nourished.       Overweight, good color, no dyspnea  HENT:  Head: Normocephalic and atraumatic.  Eyes: Conjunctivae and EOM are normal. Pupils are equal, round, and reactive to light.  Neck: Normal range of motion. Neck supple.  Cardiovascular: Normal rate and regular rhythm.   Pulmonary/Chest: Effort normal and breath sounds normal.  Abdominal:  Soft. Bowel sounds are normal.  Musculoskeletal: Normal range of motion.  Neurological: She is alert and oriented to person, place, and time.  Skin: Skin is warm and dry.  Psychiatric: She has a normal mood and affect.    ED Course  Procedures  Date: 08/28/2011  Rate: 71  Rhythm: normal sinus rhythm  QRS Axis: left  Intervals: normal  ST/T Wave abnormalities: normal  Conduction Disutrbances:none  Narrative Interpretation:   Old EKG Reviewed: none available  MDM Patient has good history for acute coronary syndrome. Symptoms have been occurring intermittently for a couple weeks. EKG and cardiac enzymes negative. Rx aspirin, nitroglycerin, morphine. Discussed with primary care physician to will admit to hospital. No evidence of acute MI      Donnetta Hutching, MD 08/28/11 [id number]  Donnetta Hutching, MD 08/28/11 2218

## 2011-08-28 NOTE — ED Notes (Signed)
Pt states she has had nagging chest pain for about 2 weeks that she has been relieving with nitro 0.4 sl tabs. Pt states her her left hand and finger are tingling.

## 2011-08-28 NOTE — ED Notes (Signed)
Attempted to call report for the 2nd time. Advised nurse would return call.

## 2011-08-29 ENCOUNTER — Other Ambulatory Visit: Payer: Self-pay

## 2011-08-29 ENCOUNTER — Encounter (HOSPITAL_COMMUNITY): Payer: Self-pay | Admitting: Cardiology

## 2011-08-29 DIAGNOSIS — R079 Chest pain, unspecified: Secondary | ICD-10-CM

## 2011-08-29 DIAGNOSIS — I517 Cardiomegaly: Secondary | ICD-10-CM

## 2011-08-29 LAB — GLUCOSE, CAPILLARY: Glucose-Capillary: 203 mg/dL — ABNORMAL HIGH (ref 70–99)

## 2011-08-29 LAB — CARDIAC PANEL(CRET KIN+CKTOT+MB+TROPI)
CK, MB: 2.5 ng/mL (ref 0.3–4.0)
CK, MB: 2.3 ng/mL (ref 0.3–4.0)
Total CK: 89 U/L (ref 7–177)
Troponin I: <0.3 ng/mL (ref <0.30)

## 2011-08-29 LAB — MRSA PCR SCREENING: MRSA by PCR: NEGATIVE

## 2011-08-29 LAB — HEPARIN LEVEL (UNFRACTIONATED): Heparin Unfractionated: 0.23 IU/mL — ABNORMAL LOW (ref 0.30–0.70)

## 2011-08-29 MED ORDER — ISOSORBIDE MONONITRATE ER 30 MG PO TB24
30.0000 mg | ORAL_TABLET | Freq: Every day | ORAL | Status: DC
  Administered 2011-08-29 – 2011-08-31 (×3): 30 mg via ORAL
  Filled 2011-08-29 (×2): qty 1

## 2011-08-29 MED ORDER — HEPARIN (PORCINE) IN NACL 2-0.9 UNIT/ML-% IJ SOLN: INTRAMUSCULAR | Status: DC

## 2011-08-29 MED ORDER — REGADENOSON 0.4 MG/5ML IV SOLN
0.4000 mg | Freq: Once | INTRAVENOUS | Status: AC
  Administered 2011-08-30: 0.4 mg via INTRAVENOUS
  Filled 2011-08-29: qty 5

## 2011-08-29 MED ORDER — HEPARIN (PORCINE) IN NACL 100-0.45 UNIT/ML-% IJ SOLN
1200.0000 [IU]/h | INTRAMUSCULAR | Status: AC
  Administered 2011-08-29 (×2): 1200 [IU]/h via INTRAVENOUS
  Filled 2011-08-29 (×2): qty 250

## 2011-08-29 MED ORDER — SODIUM CHLORIDE 0.9 % IJ SOLN
INTRAMUSCULAR | Status: AC
  Administered 2011-08-30: 10 mL
  Filled 2011-08-29: qty 10

## 2011-08-29 MED ORDER — ENALAPRIL MALEATE 5 MG PO TABS
10.0000 mg | ORAL_TABLET | Freq: Every day | ORAL | Status: DC
  Administered 2011-08-29 – 2011-08-31 (×3): 10 mg via ORAL
  Filled 2011-08-29 (×2): qty 2
  Filled 2011-08-29 (×3): qty 1

## 2011-08-29 MED ORDER — HEPARIN BOLUS VIA INFUSION
4000.0000 [IU] | Freq: Once | INTRAVENOUS | Status: AC
  Administered 2011-08-29: 4000 [IU] via INTRAVENOUS
  Filled 2011-08-29: qty 4000

## 2011-08-29 MED ORDER — SODIUM CHLORIDE 0.9 % IJ SOLN
INTRAMUSCULAR | Status: AC
  Filled 2011-08-29: qty 10

## 2011-08-29 NOTE — Progress Notes (Signed)
*  PRELIMINARY RESULTS* Echocardiogram 2D Echocardiogram has been performed.  [name] [name] 08/29/2011, 1:04 PM

## 2011-08-29 NOTE — Plan of Care (Signed)
Problem: Phase I Progression Outcomes Goal: Aspirin unless contraindicated Outcome: Completed/Met Date Met:  08/29/11 Asa given  In ed

## 2011-08-29 NOTE — Consult Note (Signed)
Clinical Summary [name] [name] is a 75 y.o.female admitted with chest pain, cardiac history noted below. She reports the onset of chest pain approximately 2 weeks ago, describes a dullness in her central chest, some radiation to the left shoulder and back, not specifically exertional in nature. She has had associated shortness of breath, no palpitations or syncope. She indicates that these symptoms are similar to symptoms that she had last January, at which time cardiac catheterization revealed patent bypass grafts, medically managed disease overall, and cardiac markers were normal.  She reports no interval ischemic testing, due for followup with [name] [name] in the near future. She indicates psychosocial stressors, some deaths in the family within the last 6 months. Reports compliance with her medications. No interval exertional chest pain.    Allergies  Allergen Reactions  . Codeine Itching    Medication list reviewed.  Past Medical History  Diagnosis Date  . Type 2 diabetes mellitus   . Essential hypertension, benign   . Obstructive sleep apnea   . Coronary atherosclerosis of native coronary artery     Multivessel, LVEF 60%  . Hypothyroidism   . Chronic diastolic heart failure   . Stroke   . Chronic back pain   . Vestibular neuritis   . Varicose veins     Past Surgical History  Procedure Date  . Coronary artery bypass graft 6/01    SVG to diagonal, LIMA to LAD  . Rotator cuff surgery   . Appendectomy   . Cholecystectomy   . Abdominal hysterectomy   . Back surgery     Family History  Problem Relation Age of Onset  . Coronary artery disease      Social History [name] [name] reports that she has never smoked. She has never used smokeless tobacco. [name] [name] reports that she does not drink alcohol.  Review of Systems No cough or hemoptysis, no fevers or chills. Stable weight. No orthopnea or PND. No lower extremity edema.  Physical Examination Filed Vitals:   08/29/11 0800  BP: 127/54  Pulse: 55  Temp: 98 F (36.7 C)  Resp: 13  Obese woman in no acute distress. HEENT: Conjunctiva and lids are normal, oropharynx clear. Neck: Supple, no elevated JVP or carotid bruits, no stomach appeared Lungs: Clear to auscultation, nonlabored. Cardiac: Regular rate and rhythm, somewhat distant heart sounds, no S3 gallop or rub. Abdomen: Soft, nontender, bowel sounds present. Skin: Warm and dry. Studies: Trace ankle edema, distal pulses 1-2+. Musculoskeletal: No kyphosis. Neuropsychiatric: Alert and oriented x3, affect appropriate.  Lab Results  Component Value Date   WBC 10.2 08/28/2011   HGB 14.0 08/28/2011   HCT 41.6 08/28/2011   MCV 91.2 08/28/2011   PLT 219 08/28/2011   Lab Results  Component Value Date   CREATININE 0.62 08/28/2011   BUN 10 08/28/2011   NA 136 08/28/2011   K 3.6 08/28/2011   CL 100 08/28/2011   CO2 26 08/28/2011   Lab Results  Component Value Date   CKTOTAL 82 08/29/2011   CKMB 2.3 08/29/2011   TROPONINI <0.30 08/29/2011    ECG Sinus rhythm with leftward axis, decreased R wave progression anteriorly, nonspecific ST-T wave changes, mainly in the inferolateral leads.  Studies Cardiac catheterization 01/20/2010:  ANGIOGRAPHIC FINDINGS:   1. The left main coronary artery had no evidence of obstructive       disease.   2. The left anterior descending had 100% occlusion in the proximal       portion.  There were  previously placed stents that were visualized       radiographically beyond the area of total occlusion.  This vessel       appeared unchanged from the prior catheterization.  The diagonal       branch is known to fill from the saphenous vein graft.  The distal       LAD is known to fill from the left internal mammary artery graft.   3. The circumflex artery gave off a very early small ramus       intermediate branch that now has a 90% ostial stenosis.  When       compared to the prior catheterization, this has progressed from        70%, and now to 90%.  The circumflex itself is comprised mainly of       a large bifurcating obtuse marginal branch.  There appears to be       serial 30% lesions throughout the proximal and midportion of the       vessel.  The obtuse marginal branches and appears to have a distal       50% stenosis.   4. The right coronary artery is a large dominant vessel that has       luminal irregularities noted throughout the proximal, mid and       distal portion.  There is diffuse plaque noted throughout the PDA       and posterolateral branch.   5. Saphenous vein graft to the diagonal is patent.  This backfills the       diagonal into the mid LAD.  There is evidence of prior T stenting       of the diagonal and LAD.   6. The left internal mammary artery graft to the mid LAD is patent,       however, the LAD is a small-caliber vessel (1 mm approximately)       with diffuse disease both proximal and distal to graft insertion.   7. Left ventricular angiogram was performed in the RAO projection and       shows normal left ventricular systolic function with no wall motion       abnormalities.  Ejection fraction was 60-65%.  Chest x-ray 08/28/2011: Comparison: 03/14/2011  Findings: Previous CABG. Fixation hardware in the right humerus partially seen. Low lung volumes. No confluent airspace infiltrate or overt edema. Heart size upper limits normal. No effusion.  IMPRESSION:  1. Stable postoperative changes. No acute disease.   Impression:  1. Chest pain syndrome, consider unstable angina, however associated with normal cardiac markers and nonspecific ECG. She does not specifically endorse substantial improvement with nitroglycerin at home, is symptomatically stable at the present time.  2. Known history of coronary artery disease, overall multivessel with occluded LAD, status post bypass surgery with documentation of patent grafts at angiography last January. LVEF 60-65% by prior assessment.  3.  Bradycardia, overall asymptomatic. Patient is not on any heart rate lowering medications as an outpatient.  4. Hyperlipidemia, history of intolerance of simvastatin.. Presently placed on Crestor.  5. Type 2 diabetes mellitus.  Recommendations:  I discussed the situation with the patient and her husband. Symptoms have both typical and atypical features, although unstable angina is certainly a consideration. It is reassuring to note that her cardiac markers are normal, and her ECG is nonspecific despite symptoms. Symptoms are also reportedly similar to prior presentation in January of last year, at which time she had patent bypass  grafts with otherwise medically managed disease. We did discuss the possibility of a repeat cardiac catheterization, however she was somewhat hesitant to consider this as a first step. Our plan at this time is to initiate Imdur 30 mg daily, discontinue nitroglycerin drip, stop heparin drip in the morning presuming she remains clinically stable, and arrange for a Lexiscan Myoview as an inpatient on medical therapy to better assess ischemic burden and help guide future therapy. A 2-D echocardiogram is also being ordered for today. No beta blocker given bradycardia. Otherwise continue present medications. Further plans to follow.

## 2011-08-29 NOTE — Consult Note (Signed)
ANTICOAGULATION CONSULT NOTE - Initial Consult  Pharmacy Consult for iv Heparin Indication: chest pain  Allergies  Allergen Reactions  . Codeine Itching   Patient Measurements: Height: 5\' 4"  (162.6 cm) Weight: 220 lb 0.3 oz (99.8 kg) IBW/kg (Calculated) : 54.7   Vital Signs: Temp: 98 F (36.7 C) (09/06 0800) Temp src: Oral (09/06 0800) BP: 127/54 mmHg (09/06 0800) Pulse Rate: 55  (09/06 0800)  Labs:  Basename 08/29/11 0733 08/29/11 0207 08/28/11 1900  HGB -- -- 14.0  HCT -- -- 41.6  PLT -- -- 219  APTT -- -- --  LABPROT -- -- --  INR -- -- --  HEPARINUNFRC 0.34 -- --  CREATININE -- -- 0.62  CRCLEARANCE -- -- --  CKTOTAL 82 89 108  CKMB 2.3 2.5 2.6  TROPONINI <0.30 <0.30 <0.30   Medical History: Past Medical History  Diagnosis Date  . Acute MI   . Diabetes mellitus   . Hypertension   . Heart disease   . Apnea, sleep    Medications:  Scheduled:    . sodium chloride   Intravenous Once  . aspirin  324 mg Oral Once  . aspirin EC  325 mg Oral Daily  . enalapril  10 mg Oral Daily  . heparin  4,000 Units Intravenous Once  . insulin aspart  0-20 Units Subcutaneous TID WC  . insulin aspart  0-5 Units Subcutaneous QHS  . insulin glargine  20 Units Subcutaneous QHS  . levothyroxine  25 mcg Oral Daily  .  morphine injection  4 mg Intravenous Once  . nitroGLYCERIN  0.4 mg Sublingual Once  . nitroGLYCERIN  0.4 mg Sublingual Once  . polyethylene glycol  17 g Oral Daily  . rosuvastatin  20 mg Oral QHS  . sodium chloride      . sodium chloride      . trimethoprim  100 mg Oral QHS  . DISCONTD: enalapril  10 mg Oral Daily  . DISCONTD: nitroGLYCERIN  0.4 mg Sublingual Once   Assessment: Heparin level therapeutic this am Goal of Therapy:  Heparin level 0.3-0.7 units/ml   Plan: Continue heparin at current rate F/u level to ensure remains therapeutic Monitor cbc per protocol  Valrie Hart A 08/29/2011,10:12 AM

## 2011-08-29 NOTE — Progress Notes (Signed)
NAME:  [name] [name], [name] [name]              ACCOUNT NO.:  [id number]  MEDICAL RECORD NO.:  [id number]  LOCATION:                                 FACILITY:  PHYSICIAN:  Quinterius Gaida G. Renard Matter, MD   DATE OF BIRTH:  09-May-1936  DATE OF PROCEDURE: DATE OF DISCHARGE:                                PROGRESS NOTE   This patient with known cardiac disease and began having left-sided chest pain for about 2 weeks and shortness of breath and diaphoresis, but no nausea.  She has a history of having myocardial infarction in the past.  Pain is similar.  She had bypass in 2007.  Has been following with Dr. Dietrich Pates in Cardiology.  She has had less pain through the night.  Her cardiac markers have remained normal.  OBJECTIVE:  VITAL SIGNS:  Blood pressure 146/67, respirations 12, pulse 61, temperature 98.1. LUNGS:  Diminished breath sounds. HEART:  Regular rhythm. ABDOMEN:  No palpable organs or masses.  ASSESSMENT:  The patient was admitted with chest pain, has known coronary artery disease with previous bypass grafting and myocardial infarction.  The patient's condition stable, no acute disease.  Plan to have cardiac evaluation by Cardiology.  Continue current regimen.     Thomson Herbers G. Renard Matter, MD     AGM/MEDQ  D:  08/29/2011  T:  08/29/2011  Job:  [id number]

## 2011-08-29 NOTE — Progress Notes (Signed)
UR Chart Review Completed  

## 2011-08-30 ENCOUNTER — Inpatient Hospital Stay (HOSPITAL_COMMUNITY): Payer: Medicare Other

## 2011-08-30 ENCOUNTER — Encounter (HOSPITAL_COMMUNITY): Payer: Self-pay | Admitting: Cardiology

## 2011-08-30 ENCOUNTER — Other Ambulatory Visit: Payer: Self-pay

## 2011-08-30 ENCOUNTER — Encounter (HOSPITAL_COMMUNITY): Payer: Self-pay | Admitting: Radiology

## 2011-08-30 LAB — GLUCOSE, CAPILLARY
Glucose-Capillary: 190 mg/dL — ABNORMAL HIGH (ref 70–99)
Glucose-Capillary: 174 mg/dL — ABNORMAL HIGH (ref 70–99)
Glucose-Capillary: 271 mg/dL — ABNORMAL HIGH (ref 70–99)

## 2011-08-30 LAB — CBC
HCT: 42.9 % (ref 36.0–46.0)
Hemoglobin: 14 g/dL (ref 12.0–15.0)
MCH: 30.1 pg (ref 26.0–34.0)
MCV: 92.3 fL (ref 78.0–100.0)
RBC: 4.65 MIL/uL (ref 3.87–5.11)

## 2011-08-30 MED ORDER — BENZONATATE 100 MG PO CAPS
200.0000 mg | ORAL_CAPSULE | Freq: Three times a day (TID) | ORAL | Status: DC
  Administered 2011-08-30 – 2011-08-31 (×4): 200 mg via ORAL
  Filled 2011-08-30 (×4): qty 2

## 2011-08-30 MED ORDER — REGADENOSON 0.4 MG/5ML IV SOLN
INTRAVENOUS | Status: AC
  Administered 2011-08-30: 0.4 mg via INTRAVENOUS
  Filled 2011-08-30: qty 5

## 2011-08-30 MED ORDER — TECHNETIUM TC 99M TETROFOSMIN IV KIT
30.0000 | PACK | Freq: Once | INTRAVENOUS | Status: AC | PRN
  Administered 2011-08-30: 31.3 via INTRAVENOUS

## 2011-08-30 MED ORDER — SODIUM CHLORIDE 0.9 % IJ SOLN
INTRAMUSCULAR | Status: AC
  Administered 2011-08-30: 10 mL via INTRAVENOUS
  Filled 2011-08-30: qty 10

## 2011-08-30 MED ORDER — TECHNETIUM TC 99M TETROFOSMIN IV KIT
10.0000 | PACK | Freq: Once | INTRAVENOUS | Status: AC | PRN
  Administered 2011-08-30: 10.5 via INTRAVENOUS

## 2011-08-30 MED ORDER — METFORMIN HCL 500 MG PO TABS
500.0000 mg | ORAL_TABLET | Freq: Two times a day (BID) | ORAL | Status: DC
  Administered 2011-08-30 – 2011-08-31 (×3): 500 mg via ORAL
  Filled 2011-08-30 (×3): qty 1

## 2011-08-30 NOTE — Progress Notes (Signed)
Inpatient Diabetes Program Recommendations  AACE/ADA: New Consensus Statement on Inpatient Glycemic Control (2009)  Target Ranges:  Prepandial:   less than 140 mg/dL      Peak postprandial:   less than 180 mg/dL (1-2 hours)      Critically ill patients:  140 - 180 mg/dL   Reason for Visit: Elevated fasting glucose: 190 mg/dL  Inpatient Diabetes Program Recommendations Insulin - Basal: Increase Lantus to 30 units qhs

## 2011-08-30 NOTE — Progress Notes (Signed)
NAME:  [name] [name], [name] [name]              ACCOUNT NO.:  [id number]  MEDICAL RECORD NO.:  [id number]  LOCATION:  [facility]05                         FACILITY:  [facility]  PHYSICIAN:  Maebelle Sulton G. Renard Matter, MD   DATE OF BIRTH:  Jul 10, 1936  DATE OF PROCEDURE: DATE OF DISCHARGE:                                PROGRESS NOTE   This patient has known coronary artery disease and previous bypass graft and previous myocardial infarction.  Since admission, she has had no significant additional chest pain.  She was seen in consultation by Cardiology yesterday.  Her nitroglycerin drip was discontinued.  She is to have Lexiscan Myoview stress test and 2-D echocardiogram.  Vital signs, blood pressure 126/46, respirations 15, pulse 60, and temp 99. Blood sugars range 199-246.  Most recent cardiac markers, total CK 82, CK-MB 2.3, relative index less than 0.30.  OBJECTIVE:  VITAL SIGNS:  Blood pressure 126/46, respirations 16, pulse 60, and temp 99.6. LUNGS:  Diminished breath sounds. HEART:  Regular rhythm.  No murmurs.  No cardiomegaly. ABDOMEN:  No palpable organs or masses.  ASSESSMENT:  The patient was admitted with chest pain.  She does have diabetes and coronary artery disease.  PLAN:  To proceed with stress testing today.  Continue current regimen otherwise.     Joran Kallal G. Renard Matter, MD     AGM/MEDQ  D:  08/30/2011  T:  08/30/2011  Job:  [id number]

## 2011-08-30 NOTE — Progress Notes (Signed)
Subjective: Patient's symptoms are improved. She has had only a modest recurrent chest discomfort, which is atypical and not associated with other symptoms.  Objective: Vital signs in last 24 hours: Temp:  [98.8 F (37.1 C)-100.3 F (37.9 C)] 98.8 F (37.1 C) (09/07 1600) Pulse Rate:  [58-100] 64  (09/07 1000) Resp:  [12-22] 16  (09/07 1600) BP: (98-156)/(41-97) 109/96 mmHg (09/07 1600) SpO2:  [90 %-99 %] 90 % (09/07 1000) Weight:  [101.6 kg (223 lb 15.8 oz)] 223 lb 15.8 oz (101.6 kg) (09/07 0400) Weight change: 7.252 kg (15 lb 15.8 oz) Last BM Date: 08/28/11 (pt denies constipation)  Intake/Output from previous day: 09/06 0701 - 09/07 0700 In: 2020.5 [P.O.:840; I.V.:1180.5] Out: 1900 [Urine:1900] Intake/Output this shift: Total I/O In: 100 [I.V.:100] Out: 1050 [Urine:1050]  General-Well developed; no acute distress Body habitus-obese Neck-No JVD, no carotid bruits Lungs: clear lung fields; normal I:E ratio Cardiovascular-normal PMI; normal S1 and S2; modest systolic murmur Abdomen-normal bowel sounds; soft and non-tender without masses or organomegaly Skin-Warm, no significant lesions Extremities-Nl distal pulses;  trace  edema   Lab Results:  Basename 08/30/11 0702 08/28/11 1900  WBC 10.6* 10.2  HGB 14.0 14.0  HCT 42.9 41.6  PLT 206 219   BMET  Basename 08/28/11 1900  NA 136  K 3.6  CL 100  CO2 26  GLUCOSE 240*  BUN 10  CREATININE 0.62  CALCIUM 9.0    Studies/Results: Dg Chest Portable 1 View  08/28/2011  *RADIOLOGY REPORT*  Clinical Data: Chest pain, shortness of breath  PORTABLE CHEST - 1 VIEW  Comparison: 03/14/2011  Findings: Previous CABG.  Fixation hardware in the right humerus partially seen.  Low lung volumes.  No confluent airspace infiltrate or overt edema.  Heart size upper limits normal.  No effusion.  IMPRESSION:  1.  Stable postoperative changes.  No acute disease.  Original Report Authenticated By: Osa Craver, M.D.    Assessment/Plan: Patient has improved symptomatically. A pharmacologic stress nuclear study was performed and reviewed. There is breast attenuation but no convincing evidence for ischemia or infarction. Patient can be discharged with followup in our office.  LOS: 2 days   [place] Bing 08/30/2011, 6:37 PM

## 2011-08-30 NOTE — Progress Notes (Signed)
Stress Lab Nurses Notes - [facility]king  Aysha Livecchi [facility] 08/30/2011  Reason for doing test: CAD and Chest Pain  Type of test: Steffanie Dunn  Nurse performing test: Parke Poisson, RN  Nuclear Medicine Tech: Dillard Essex  Echo Tech: Not Applicable  MD performing test: R. Rothbart  Family MD: McInnis  Test explained and consent signed: yes  IV started: 20g jelco, Saline lock flushed, No redness or edema and Saline lock from floor  Symptoms: Headache and nause  Treatment/Intervention: None  Reason test stopped: protocol completed  After recovery IV was: No redness or edema  Patient to return to Nuc. Med at : 13:00  Patient discharged: Transported back to room ICU-07 via W/C  Patient's Condition upon discharge was: stable  Comments: BP 160/72, HR 90.   Recovery BP 150/68, HR 76.  Symptoms resolved in recovery.  Erskine Speed T

## 2011-08-31 ENCOUNTER — Ambulatory Visit (HOSPITAL_COMMUNITY): Payer: Medicare Other

## 2011-08-31 ENCOUNTER — Encounter (HOSPITAL_COMMUNITY): Payer: Medicare Other

## 2011-08-31 LAB — GLUCOSE, CAPILLARY

## 2011-08-31 MED ORDER — ISOSORBIDE MONONITRATE ER 30 MG PO TB24: 30.0000 mg | ORAL_TABLET | Freq: Every day | ORAL | Status: DC

## 2011-08-31 NOTE — Progress Notes (Signed)
Patient being discharged with instructions and prescriptions. IV cath intact with no swelling or redness. Patient verbalizes understanding of discharge instructions.

## 2011-08-31 NOTE — Discharge Summary (Signed)
NAME:  [name] [name], [name] [name]              ACCOUNT NO.:  [id number]  MEDICAL RECORD NO.:  [id number]  LOCATION:  A305                          FACILITY:  [facility]  PHYSICIAN:  Garrus Gauthreaux G. Renard Matter, MD   DATE OF BIRTH:  21-Nov-1936  DATE OF ADMISSION:  08/28/2011 DATE OF DISCHARGE:  09/08/2012LH                              DISCHARGE SUMMARY   DIAGNOSES: 1. Coronary artery disease with previous bypass graft and previous     myocardial infarction. 2. Chest pain, atypical. 3. Hypertension. 4. Sleep apnea. 5. Diabetes mellitus type 2.  CONDITION:  Stable and improved at the time of her discharge.  HISTORY:  This 75 year old female with history of cardiac disease, had been having left-sided chest pain for about 2 weeks.  It had been associated with shortness of breath and diaphoresis.  She had had myocardial infarction in the past since the pain was similar to that when she had her MI and bypass graft.  She has been seen by Dr. Dietrich Pates, cardiologist.  She had taken nitroglycerin at the time of admission, which helped some but caused headache.  EXAMINATION:  VITAL SIGNS:  Alert female with blood pressure 136/86, pulse 59, temperature 98. GENERAL:  The patient is moderately obese. HEENT: Eyes:  PERRLA.  TMs negative.  Oropharynx benign. NECK:  Supple.  No JVD or thyroid abnormalities. HEART:  Regular rhythm. Lungs: Clear to P and A. ABDOMEN:  No palpable organs or masses.  No organomegaly. EXTREMITIES:  Free of edema.  LABORATORY DATA:  CBC on admission:  WBC 10,200 with hemoglobin 14.0, hematocrit 41.6.  Chemistries on admission:  Sodium 136, potassium 3.6, chloride 100, CO2 26, glucose 214, BUN 10, creatinine 0.62, calcium 9.0, GFR greater than 60.  Cardiac markers:  CK 108, CK-MB 2.6, troponin less than 0.3.  Subsequent markers on subsequent days; CK 82, CK-MB 2.3, relative index less than 0.3.  Glucoses ranged from 82 to 246, A1c 8.4.  X-rays:  Chest x-ray stable, postoperative changes, no  acute disease.  The patient at the time of admission was placed on nasal O2 and normal saline.  She was continued on following medications: 1. Aspirin 325 mg daily. 2. Tessalon 200 mg t.i.d. 3. Enalapril 10 mg daily. 4. Isosorbide mononitrate 30 mg daily. 5. Levothyroxine 25 mg daily. 6. Metformin 500 mg b.i.d. 7. Rosuvastatin 20 mg daily. 8. Trimethoprim 100 mg at bedtime 9. Sliding scale insulin. 10.She did receive p.r.n. nitroglycerin 0.4 mg and Tylenol on     occasions.  HOSPITAL COURSE:  The patient was admitted with left-sided chest pain. Her cardiac markers remained normal.  She was seen in consultation by Cardiology, Dr. Dietrich Pates of [facility].  He felt the patient had improved symptomatically.  Pharmacological stress nuclear study was performed and reviewed, which showed no convincing evidence of ischemia or infarction.  He felt the patient could be discharged and followed up in their office in the next few days.  The patient discharged on following medications: 1. Aspirin 81 mg daily. 2. Vasotec 10 mg daily. 3. Byetta injection twice a day. 4. Lantus insulin 60 units daily at bedtime. 5. Apidra 20 units 3 times a day before meals. 6. Synthroid 25 mcg  daily. 7. Glucophage 500 mg 2 times daily. 8. Nitroglycerin p.r.n. 0.4 mg. 9. Crestor 20 mg daily. 10.Trimpex 100 mg daily.  The patient is stable and improved at the time of her discharge.     Graycee Greeson G. Renard Matter, MD     AGM/MEDQ  D:  08/31/2011  T:  08/31/2011  Job:  [id number]

## 2011-09-17 LAB — URINE CULTURE
Colony Count: >=100000
Special Requests: POSITIVE

## 2011-09-17 LAB — BLOOD GAS, ARTERIAL
Drawn by: 224301
O2 Content: 2
Patient temperature: 98.6
pCO2 arterial: 48.9 — ABNORMAL HIGH
pH, Arterial: 7.366

## 2011-09-17 LAB — URINALYSIS, ROUTINE W REFLEX MICROSCOPIC
Bilirubin Urine: NEGATIVE
Bilirubin Urine: NEGATIVE
Hgb urine dipstick: NEGATIVE
Ketones, ur: NEGATIVE
Ketones, ur: NEGATIVE
Nitrite: NEGATIVE
Nitrite: NEGATIVE
Protein, ur: NEGATIVE
Specific Gravity, Urine: 1.025
Urobilinogen, UA: 0.2
Urobilinogen, UA: 0.2

## 2011-09-17 LAB — CBC
HCT: 35 — ABNORMAL LOW
HCT: 36.9
HCT: 45.6
Hemoglobin: 11.5 — ABNORMAL LOW
Hemoglobin: 13
Hemoglobin: 15.4 — ABNORMAL HIGH
MCHC: 33.6
MCHC: 33.8
MCHC: 34.5
MCHC: 35.2
MCV: 90.2
MCV: 90.2
MCV: 90.3
MCV: 91
Platelets: 179
Platelets: 194
RBC: 3.7 — ABNORMAL LOW
RBC: 3.89
RBC: 4.06
RBC: 4.11
RDW: 13.1
RDW: 13.3
WBC: 11.6 — ABNORMAL HIGH
WBC: 12.8 — ABNORMAL HIGH
WBC: 13.4 — ABNORMAL HIGH

## 2011-09-17 LAB — POCT I-STAT, CHEM 8
Calcium, Ion: 1.01 — ABNORMAL LOW
Glucose, Bld: 169 — ABNORMAL HIGH
HCT: 46
Hemoglobin: 15.6 — ABNORMAL HIGH
Potassium: 4.5
TCO2: 25

## 2011-09-17 LAB — BASIC METABOLIC PANEL
BUN: 6
CO2: 28
CO2: 30
Calcium: 8.6
Chloride: 100
Chloride: 105
Chloride: 98
Creatinine, Ser: 0.72
Creatinine, Ser: 0.76
GFR calc Af Amer: >60
GFR calc Af Amer: >60
GFR calc Af Amer: >60
Potassium: 3.8
Potassium: 4
Sodium: 136

## 2011-09-17 LAB — URINE MICROSCOPIC-ADD ON

## 2011-09-17 LAB — TSH: TSH: 2.057

## 2011-09-17 LAB — ABO/RH: ABO/RH(D): O POS

## 2011-09-17 LAB — HEMOGLOBIN A1C: Mean Plasma Glucose: 218

## 2011-09-23 LAB — GLUCOSE, CAPILLARY
Glucose-Capillary: 164 — ABNORMAL HIGH
Glucose-Capillary: 170 — ABNORMAL HIGH
Glucose-Capillary: 207 — ABNORMAL HIGH
Glucose-Capillary: 232 — ABNORMAL HIGH
Glucose-Capillary: 275 — ABNORMAL HIGH

## 2011-09-23 LAB — CBC
HCT: 46
MCHC: 33
MCV: 91.2
RBC: 5.04
WBC: 8.3

## 2011-09-23 LAB — URINALYSIS, ROUTINE W REFLEX MICROSCOPIC
Bilirubin Urine: NEGATIVE
Glucose, UA: >1000 — AB
Hgb urine dipstick: NEGATIVE
Ketones, ur: NEGATIVE
Protein, ur: 30 — AB
pH: 5.5

## 2011-09-23 LAB — BASIC METABOLIC PANEL
CO2: 28
Chloride: 102
Creatinine, Ser: 0.63
GFR calc Af Amer: >60
Potassium: 4.1

## 2011-09-23 LAB — URINE MICROSCOPIC-ADD ON

## 2011-09-24 LAB — GLUCOSE, CAPILLARY
Glucose-Capillary: 272 — ABNORMAL HIGH
Glucose-Capillary: 307 — ABNORMAL HIGH

## 2011-10-03 LAB — DIFFERENTIAL
Basophils Relative: 1
Basophils Relative: 1
Eosinophils Absolute: 0.6
Eosinophils Relative: 6 — ABNORMAL HIGH
Lymphs Abs: 2.2
Lymphs Abs: 2.2
Monocytes Absolute: 1.2 — ABNORMAL HIGH
Monocytes Absolute: 1.2 — ABNORMAL HIGH
Monocytes Relative: 11
Monocytes Relative: 12 — ABNORMAL HIGH
Neutro Abs: 7
Neutrophils Relative %: 65

## 2011-10-03 LAB — TSH: TSH: 0.991

## 2011-10-03 LAB — BASIC METABOLIC PANEL
BUN: 11
BUN: 9
CO2: 25
CO2: 27
CO2: 29
CO2: 31
Calcium: 8.6
Calcium: 9
Calcium: 9.4
Chloride: 101
Chloride: 104
Chloride: 108
Chloride: 108
Creatinine, Ser: 0.79
Creatinine, Ser: 1.3 — ABNORMAL HIGH
GFR calc Af Amer: 34 — ABNORMAL LOW
GFR calc Af Amer: 36 — ABNORMAL LOW
GFR calc Af Amer: 49 — ABNORMAL LOW
GFR calc Af Amer: 49 — ABNORMAL LOW
GFR calc non Af Amer: 28 — ABNORMAL LOW
GFR calc non Af Amer: 40 — ABNORMAL LOW
Glucose, Bld: 275 — ABNORMAL HIGH
Potassium: 3.8
Potassium: 3.9
Potassium: 4.4
Potassium: 4.7
Sodium: 136
Sodium: 137
Sodium: 139
Sodium: 141

## 2011-10-03 LAB — CBC
HCT: 40.5
HCT: 41.7
Hemoglobin: 13.6
Hemoglobin: 13.8
Hemoglobin: 14.2
MCHC: 33.6
MCHC: 34.1
MCHC: 34.2
MCV: 90.6
MCV: 91.3
RBC: 4.43
RBC: 4.46
RDW: 13.5
WBC: 10.7 — ABNORMAL HIGH
WBC: 9.8

## 2011-11-04 IMAGING — CR DG CERVICAL SPINE COMPLETE 4+V
6 series · 6 of 6 positions shown · non-contrast
Comparison: Cervical spine radiographs 03/19/2009.

CLINICAL DATA: Neck pain.

CERVICAL SPINE - COMPLETE 4+ VIEW

[view not recorded (1 of 6)]
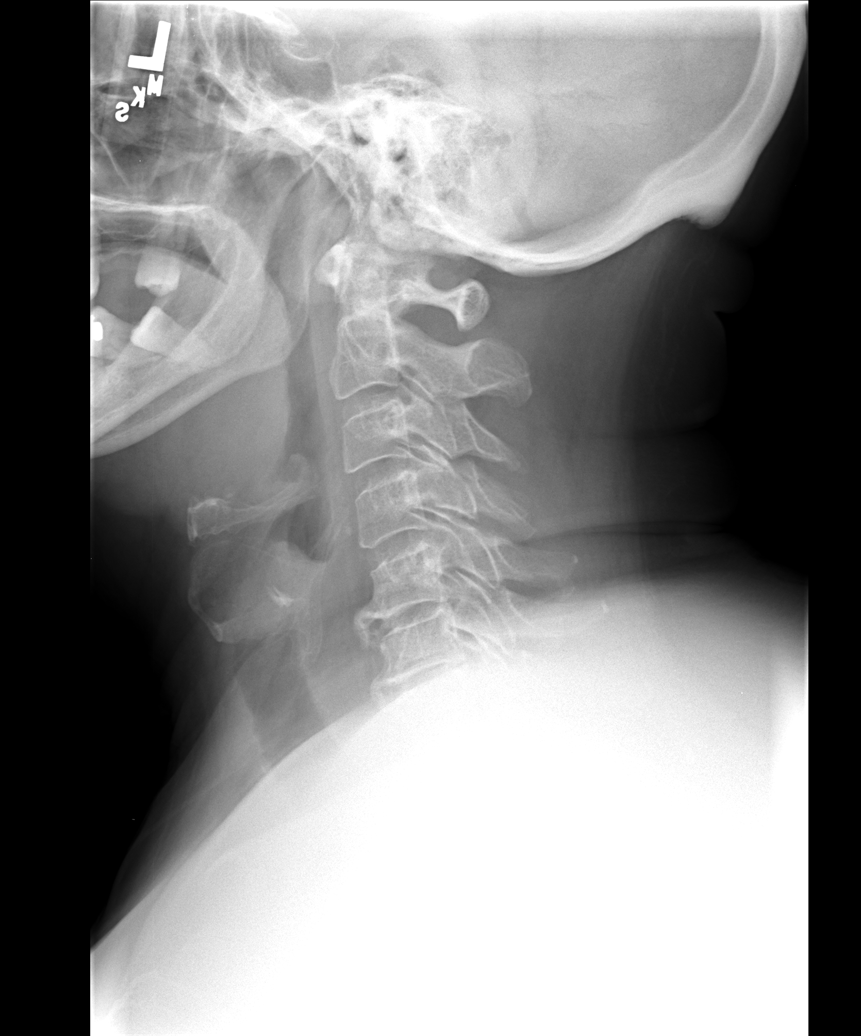

[view not recorded (2 of 6)]
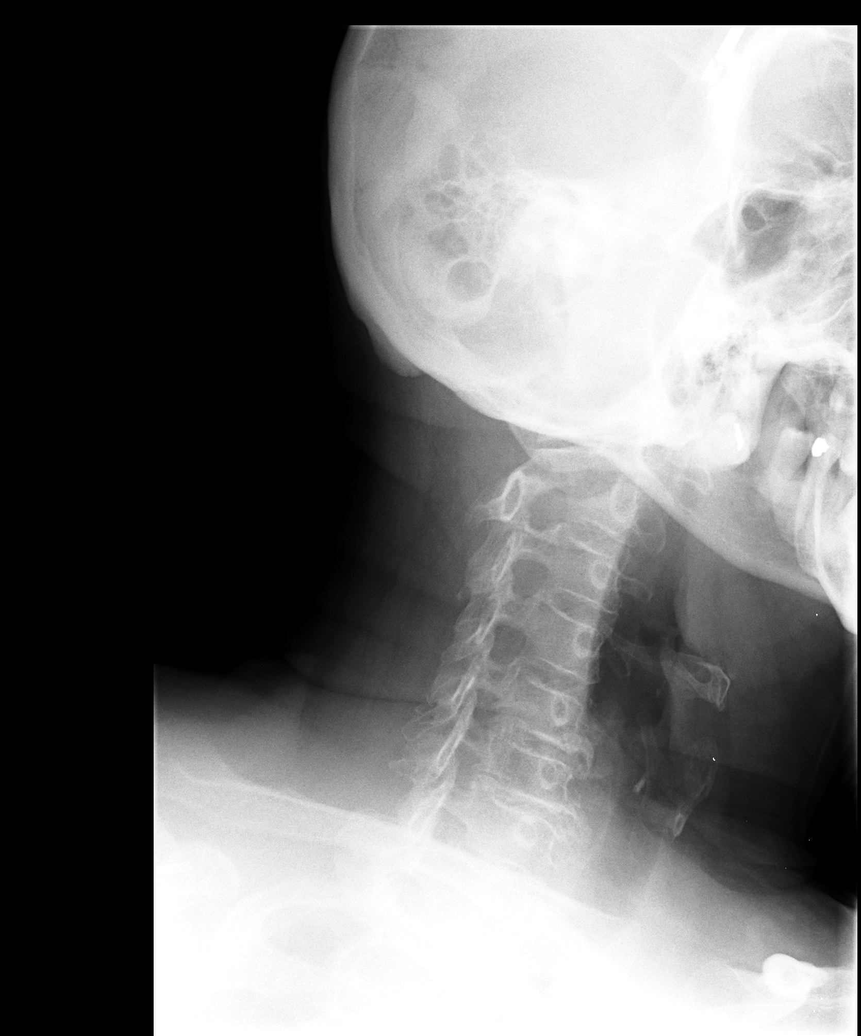

[view not recorded (3 of 6)]
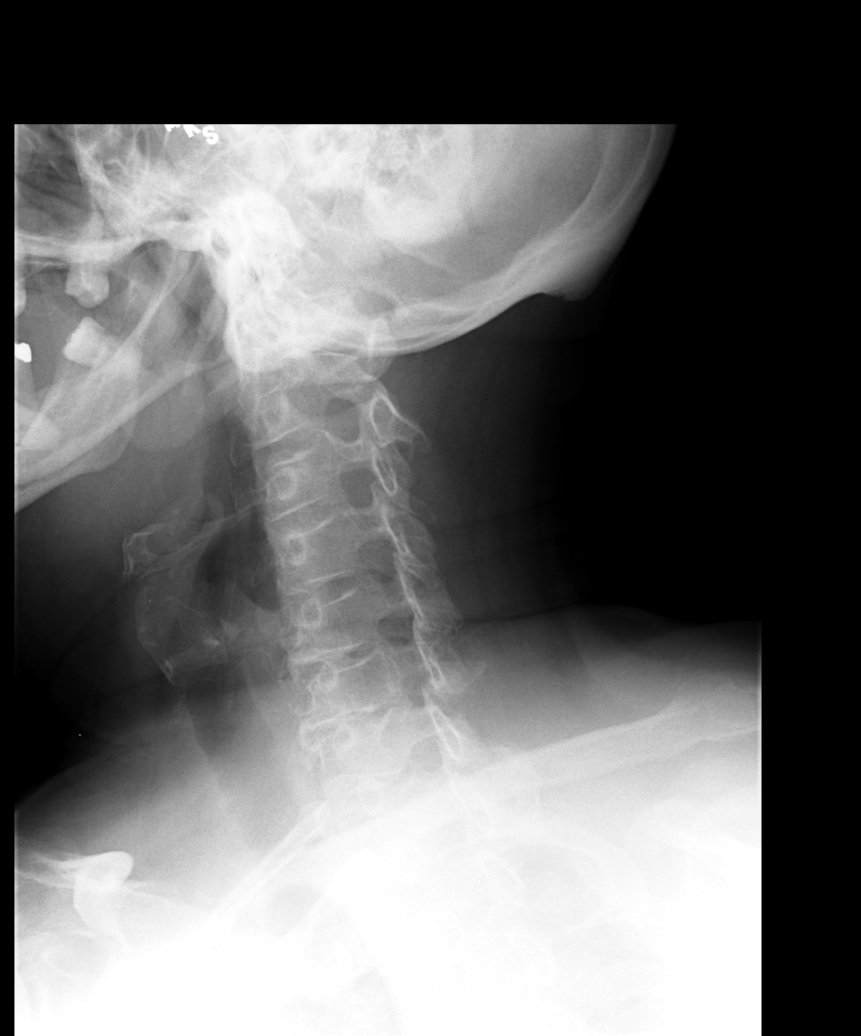

[view not recorded (4 of 6)]
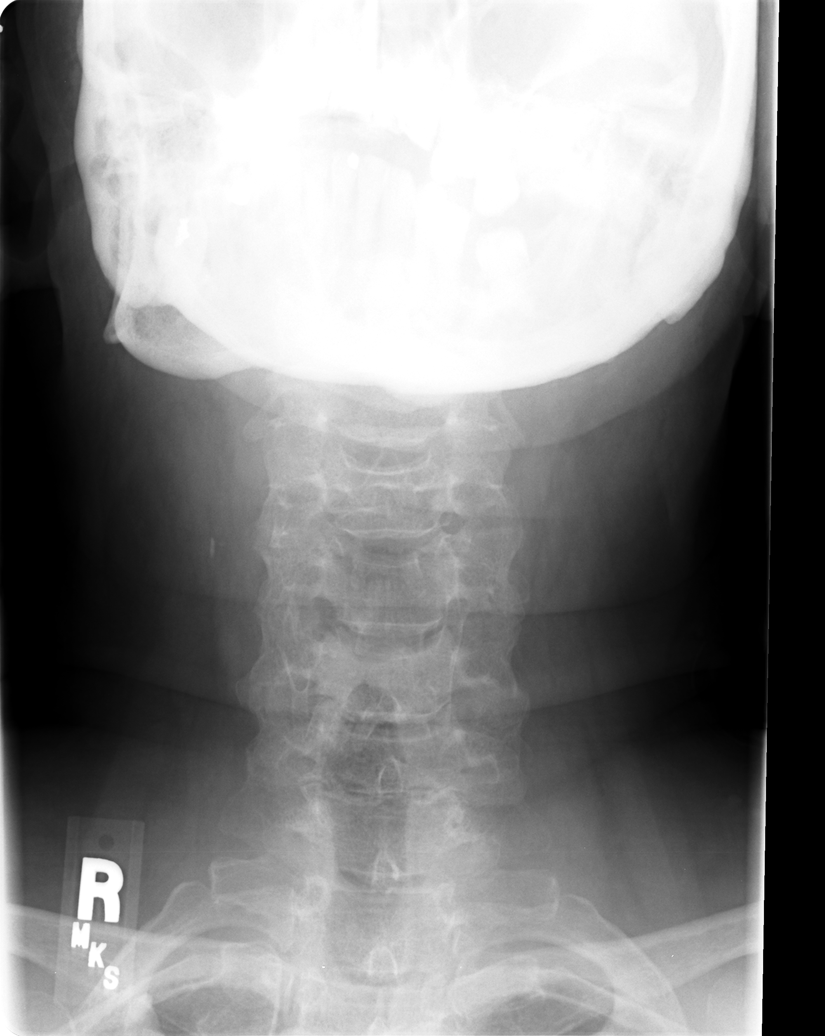

[view not recorded (5 of 6)]
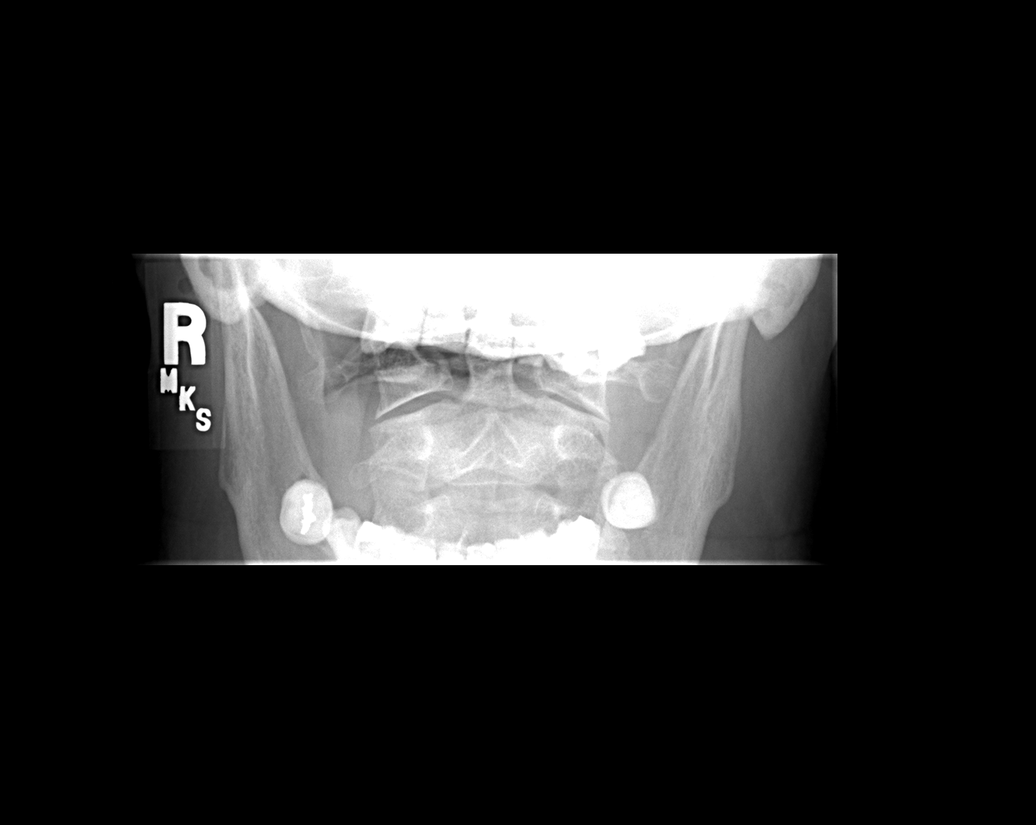

[view not recorded (6 of 6)]
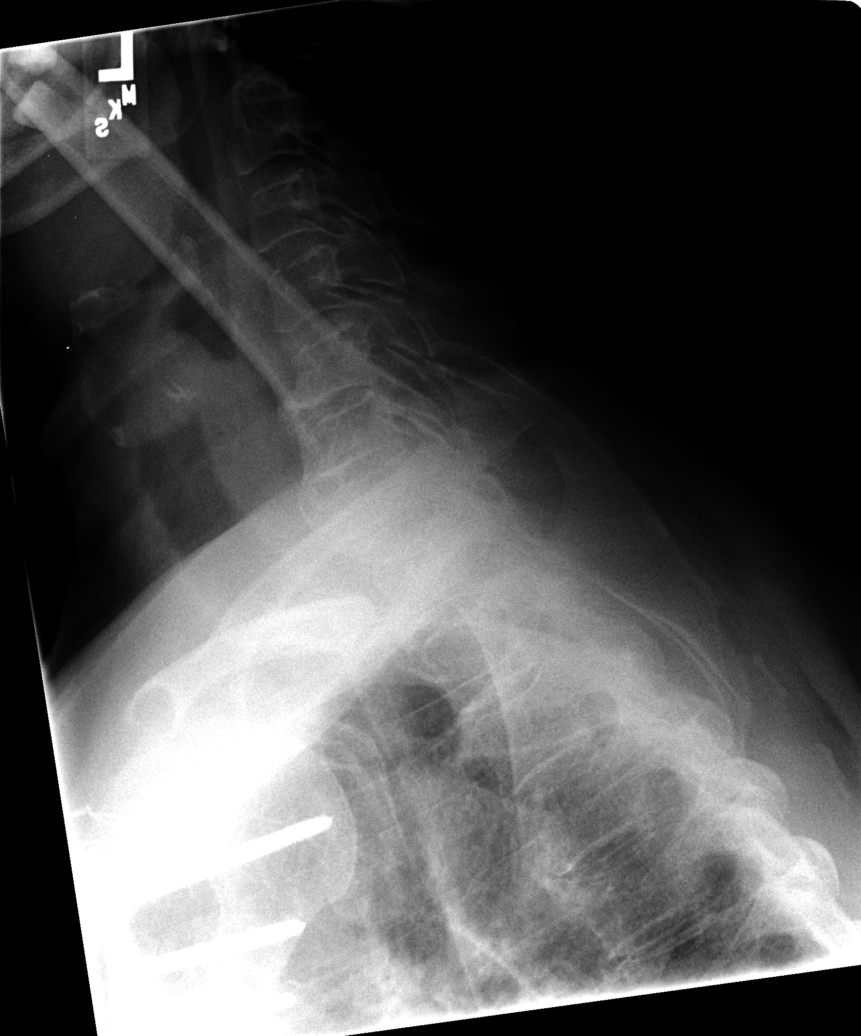

[6 of 6 positions shown; findings below may reference images not displayed]

FINDINGS: The prevertebral soft tissues are normal.  The alignment
is anatomic through T1.  There is disc space loss with osteophytes
and uncinate spurring at C5-C6 and C6-C7.  There is resulting mild
biforaminal stenosis at C6-C7 and right-sided foraminal stenosis at
C5-C6.  There is no evidence of acute fracture.
IMPRESSION: Stable cervical spondylosis.  No acute osseous findings.

## 2011-11-04 IMAGING — CR DG CHEST 2V
2 series · 2 of 2 positions shown · non-contrast
Comparison: Chest radiographs 02/06/2009 and chest CT 01/07/2008.

CLINICAL DATA: Back and neck pain.  Follow-up pulmonary nodule.

CHEST - 2 VIEW

[view not recorded (1 of 2)]
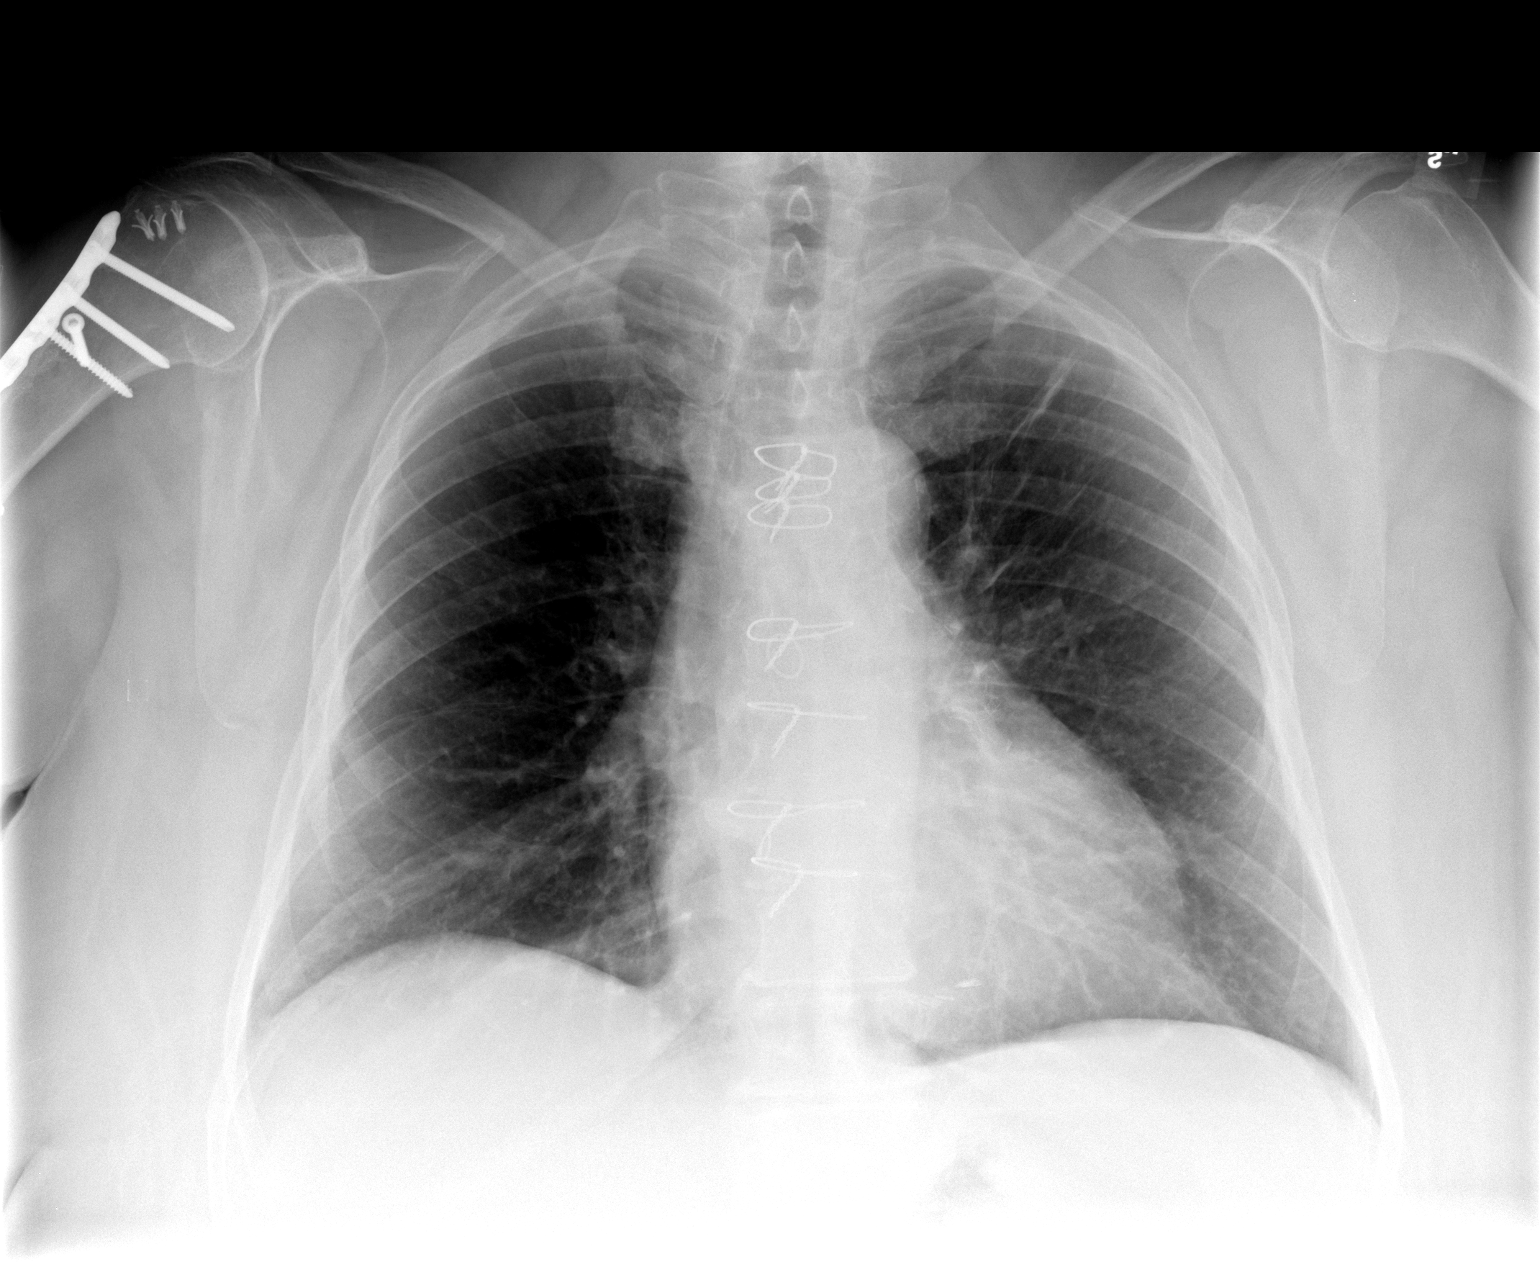

[view not recorded (2 of 2)]
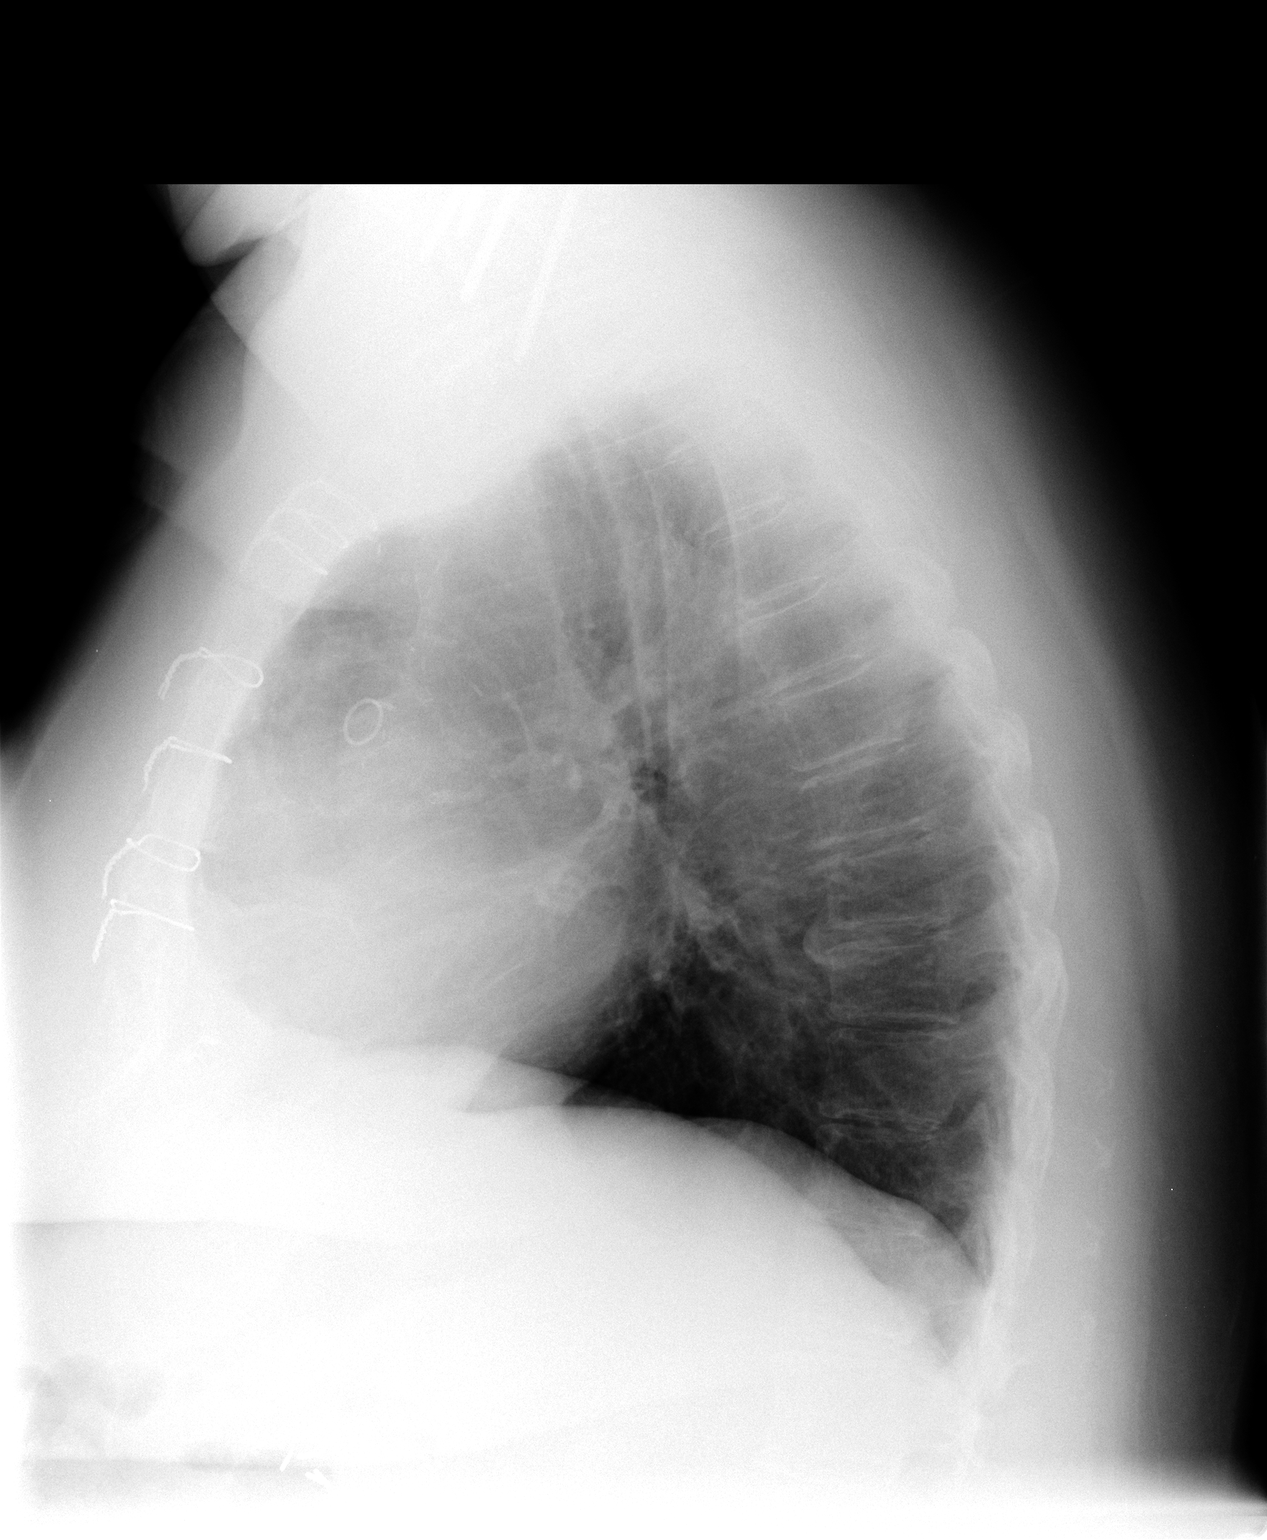

[2 of 2 positions shown; findings below may reference images not displayed]

FINDINGS: The heart size and mediastinal contours are stable status
post CABG.  Left apical scarring appears stable.  The lungs are
otherwise clear.  The 3 mm right middle lobe nodule demonstrated by
prior CT is not visualized.  There is no pleural effusion.
Postsurgical changes are noted within the proximal right humerus.
IMPRESSION: Stable mild cardiac enlargement and left apical scarring.  No acute
findings or demonstrated nodule.

## 2011-11-04 IMAGING — CR DG THORACIC SPINE 2V
3 series · 3 of 3 positions shown · non-contrast
Comparison: Chest radiographs 02/06/2009.

CLINICAL DATA: Neck and back pain.  No known recent injury.

THORACIC SPINE - 2 VIEW

[view not recorded (1 of 3)]
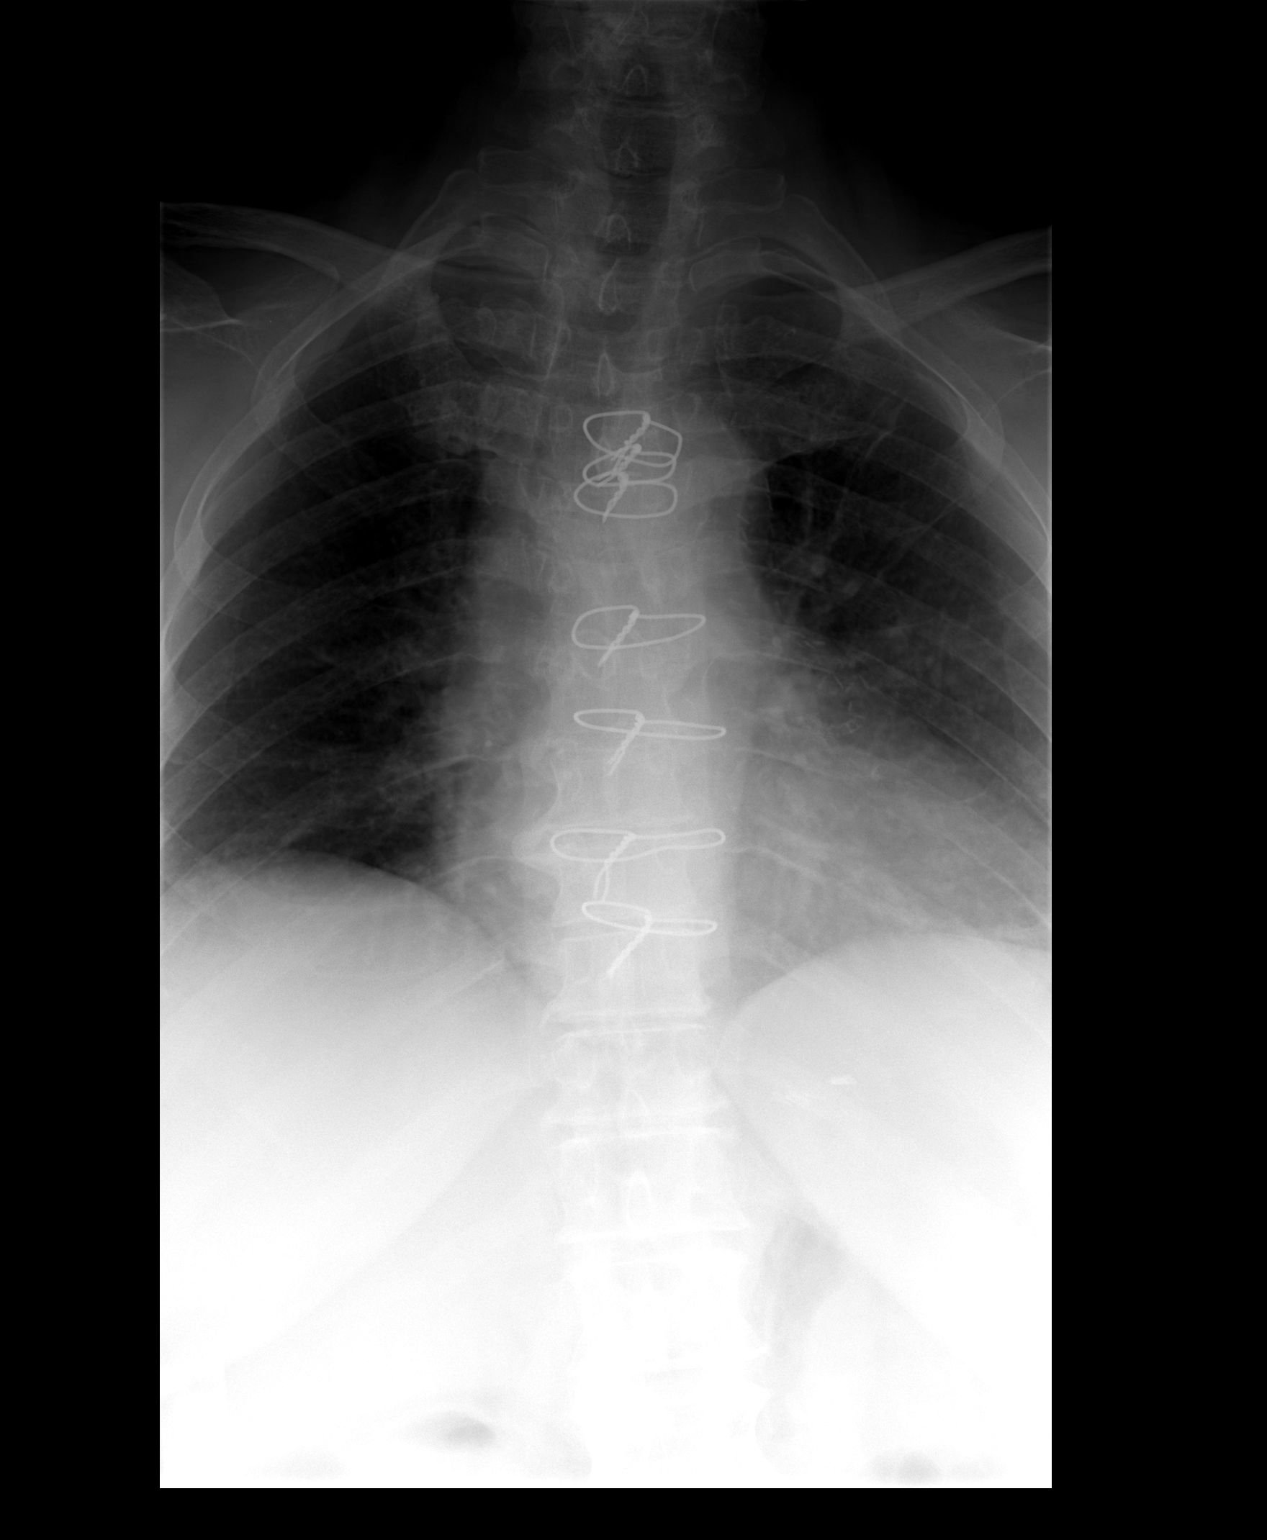

[view not recorded (2 of 3)]
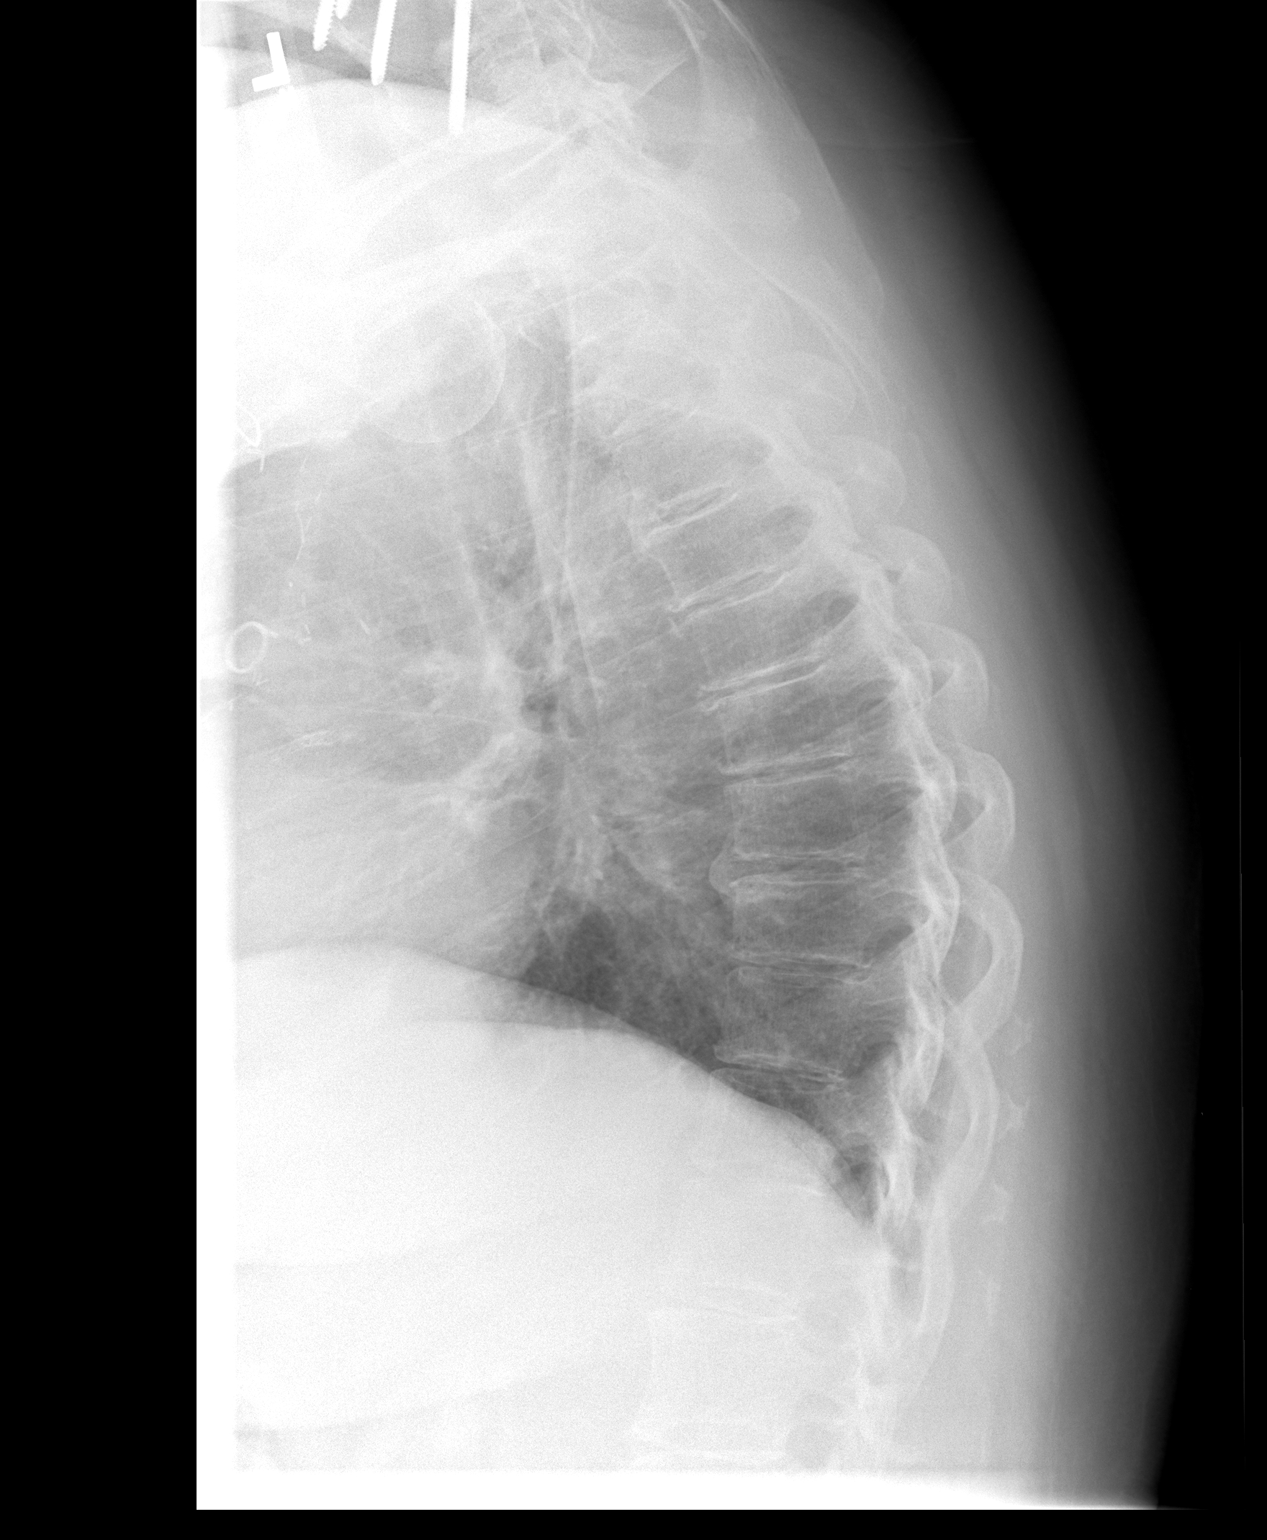

[view not recorded (3 of 3)]
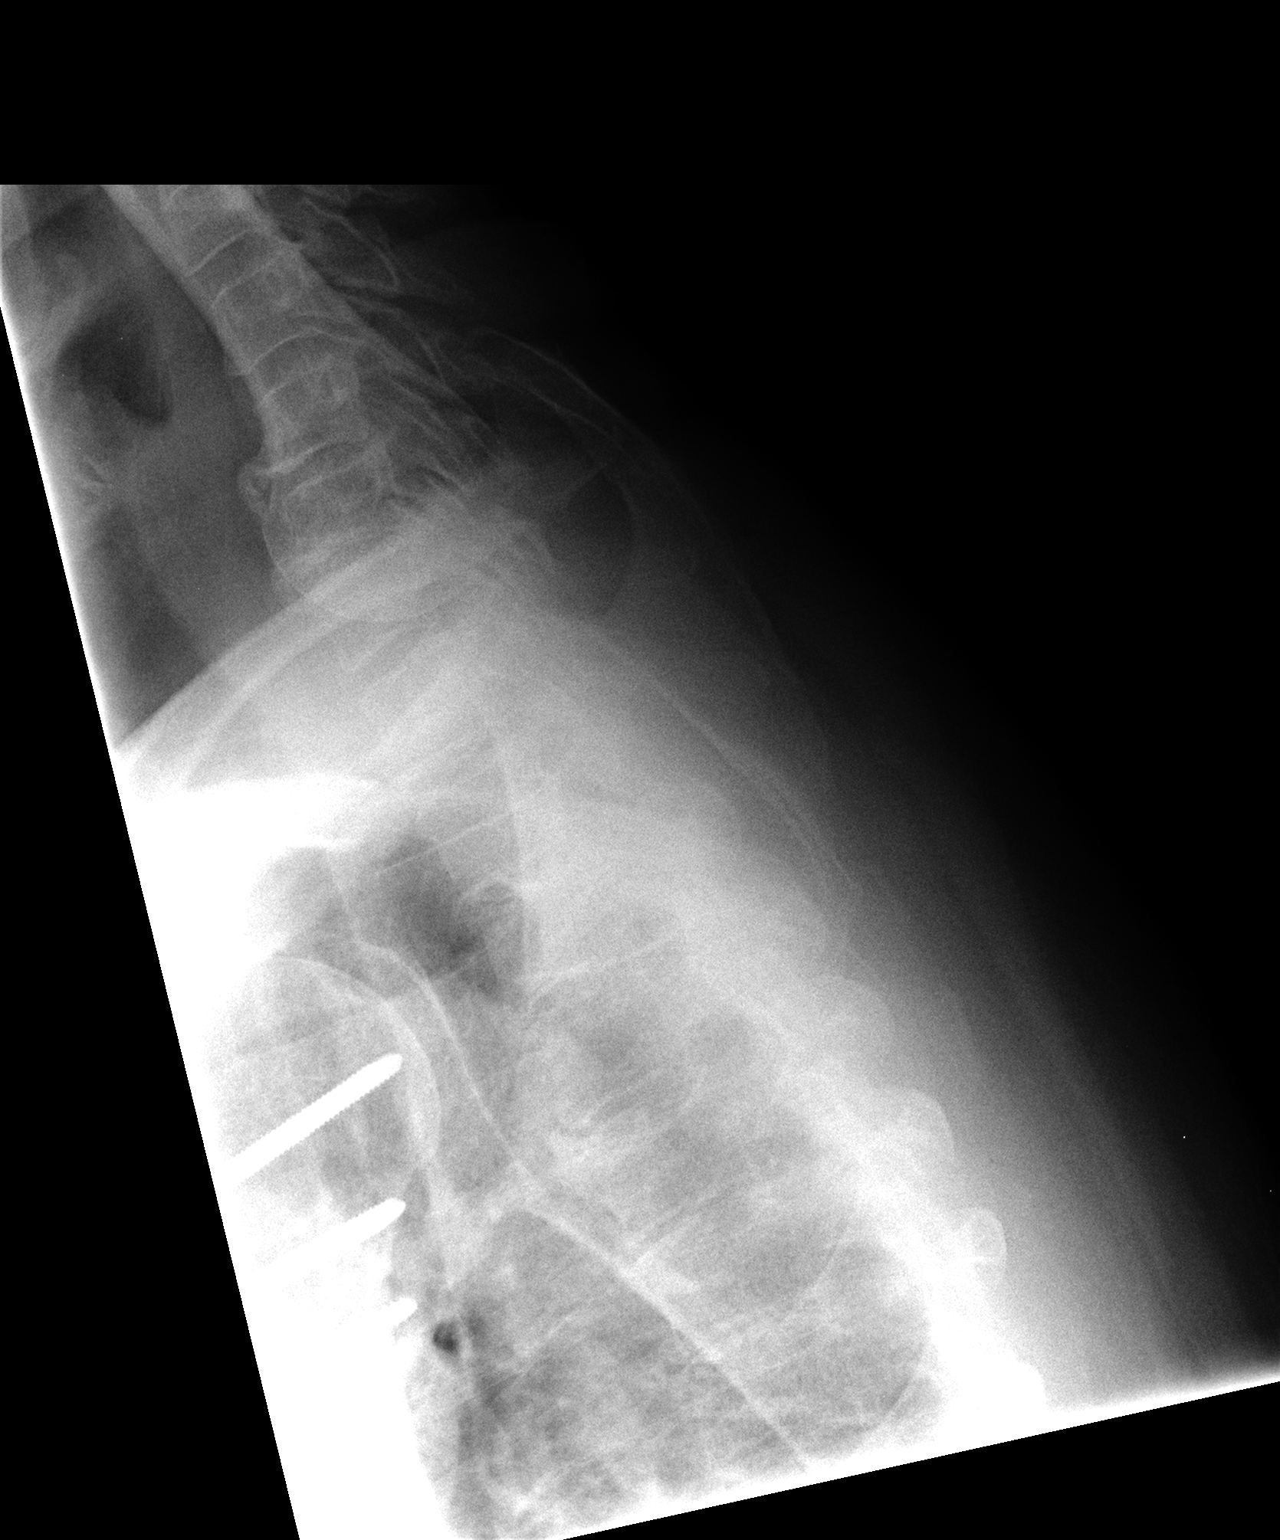

[3 of 3 positions shown; findings below may reference images not displayed]

FINDINGS: There is conventional anatomy with 12 rib-bearing
thoracic type vertebral bodies.  The alignment is normal.  There is
stable spondylosis with paraspinal osteophytes.  No fracture or
paraspinal abnormality is identified.
IMPRESSION: Mild thoracic spondylosis.  No acute osseous findings.

## 2011-12-24 HISTORY — PX: CATARACT EXTRACTION W/ INTRAOCULAR LENS  IMPLANT, BILATERAL: SHX1307

## 2012-01-07 ENCOUNTER — Ambulatory Visit (INDEPENDENT_AMBULATORY_CARE_PROVIDER_SITE_OTHER): Payer: Medicare Other | Admitting: Cardiology

## 2012-01-07 ENCOUNTER — Encounter: Payer: Self-pay | Admitting: Cardiology

## 2012-01-07 ENCOUNTER — Encounter: Payer: Self-pay | Admitting: *Deleted

## 2012-01-07 VITALS — BP 181/84 | HR 72 | Ht 64.0 in | Wt 210.0 lb

## 2012-01-07 DIAGNOSIS — G8929 Other chronic pain: Secondary | ICD-10-CM | POA: Insufficient documentation

## 2012-01-07 DIAGNOSIS — I2510 Atherosclerotic heart disease of native coronary artery without angina pectoris: Secondary | ICD-10-CM

## 2012-01-07 DIAGNOSIS — M549 Dorsalgia, unspecified: Secondary | ICD-10-CM

## 2012-01-07 DIAGNOSIS — E785 Hyperlipidemia, unspecified: Secondary | ICD-10-CM

## 2012-01-07 DIAGNOSIS — I10 Essential (primary) hypertension: Secondary | ICD-10-CM

## 2012-01-07 DIAGNOSIS — E119 Type 2 diabetes mellitus without complications: Secondary | ICD-10-CM

## 2012-01-07 DIAGNOSIS — J449 Chronic obstructive pulmonary disease, unspecified: Secondary | ICD-10-CM | POA: Insufficient documentation

## 2012-01-07 DIAGNOSIS — E669 Obesity, unspecified: Secondary | ICD-10-CM

## 2012-01-07 DIAGNOSIS — L408 Other psoriasis: Secondary | ICD-10-CM

## 2012-01-07 DIAGNOSIS — L409 Psoriasis, unspecified: Secondary | ICD-10-CM | POA: Insufficient documentation

## 2012-01-07 DIAGNOSIS — I679 Cerebrovascular disease, unspecified: Secondary | ICD-10-CM | POA: Insufficient documentation

## 2012-01-07 MED ORDER — PRAVASTATIN SODIUM 80 MG PO TABS: 80.0000 mg | ORAL_TABLET | Freq: Every day | ORAL | Status: DC

## 2012-01-07 MED ORDER — TRIAMCINOLONE ACETONIDE 0.1 % EX CREA: TOPICAL_CREAM | Freq: Two times a day (BID) | CUTANEOUS | Status: AC

## 2012-01-07 MED ORDER — TRIAMCINOLONE ACETONIDE 0.1 % EX CREA: TOPICAL_CREAM | Freq: Two times a day (BID) | CUTANEOUS | Status: DC

## 2012-01-07 MED ORDER — LISINOPRIL-HYDROCHLOROTHIAZIDE 20-12.5 MG PO TABS: 1.0000 | ORAL_TABLET | Freq: Every day | ORAL | Status: DC

## 2012-01-07 MED ORDER — AMLODIPINE BESYLATE 5 MG PO TABS: 5.0000 mg | ORAL_TABLET | Freq: Every day | ORAL | Status: DC

## 2012-01-07 NOTE — Assessment & Plan Note (Signed)
Patient is congratulated on recent weight loss and encouraged to continue to limit calories and to exercise to the extent possible.

## 2012-01-07 NOTE — Progress Notes (Signed)
Patient ID: [name] [name], female   DOB: 05-17-1936, 76 y.o.   MRN: [id number] HPI: Scheduled return visit for this very nice woman who unfortunately is somewhat inattentive to her medical care.  She occasionally measures blood pressures and has found them to be elevated.  She discontinued statin due to cost.  Diabetic control has improved since she started seeing Dr. Fransico Him.    She was hospitalized in September of 2012 for chest discomfort that was somewhat atypical.  A pharmacologic stress nuclear study was negative.  She has had no cardiopulmonary symptoms since.  Prior to Admission medications   Medication Sig Start Date End Date Taking? Authorizing Provider  aspirin EC 81 MG tablet Take 81 mg by mouth as needed.    Yes Historical Provider, MD  enalapril (VASOTEC) 10 MG tablet Take 10 mg by mouth daily.     Yes Historical Provider, MD  exenatide (BYETTA 5 MCG PEN) 5 MCG/0.02ML SOLN Inject 5 mcg into the skin 2 (two) times daily with a meal.     Yes Historical Provider, MD  insulin glargine (LANTUS SOLOSTAR) 100 UNIT/ML injection Inject 60 Units into the skin at bedtime.     Yes Historical Provider, MD  insulin glulisine (APIDRA) 100 UNIT/ML injection Inject 20 Units into the skin 3 (three) times daily before meals.     Yes Historical Provider, MD  levothyroxine (SYNTHROID, LEVOTHROID) 25 MCG tablet Take 25 mcg by mouth daily.     Yes Historical Provider, MD  metFORMIN (GLUCOPHAGE) 500 MG tablet Take 500 mg by mouth 2 (two) times daily with a meal.     Yes Historical Provider, MD  nitroGLYCERIN (NITROSTAT) 0.4 MG SL tablet Place 0.4 mg under the tongue every 5 (five) minutes as needed.     Yes Historical Provider, MD    Allergies  Allergen Reactions  . Codeine Itching      Past medical history, social history, and family history reviewed and updated.  ROS: Denies dyspnea, orthopnea, PND, palpitations, lightheadedness and syncope.  PHYSICAL EXAM: BP 181/84  Pulse 72  Ht 5\' 4"  (1.626 m)   Wt 95.255 kg (210 lb)  BMI 36.05 kg/m2; weight decreased 11 pounds since last visit  General-Well developed; no acute distress Body habitus-Mildly obese Neck-No JVD; no carotid bruits Lungs-clear lung fields; resonant to percussion; decreased breath sounds at the bases Cardiovascular-normal PMI; normal S1 and S2 Abdomen-normal bowel sounds; soft and non-tender without masses or organomegaly Musculoskeletal-No deformities, no cyanosis or clubbing Neurologic-Normal cranial nerves; symmetric strength and tone Skin-Warm, scaling or erythematous plaques over the elbows and anterior lower extremities below the knee Extremities-distal pulses intact; no edema; moderate varicose veins  ASSESSMENT AND PLAN:  [place] Bing, MD 01/07/2012 11:31 AM

## 2012-01-07 NOTE — Assessment & Plan Note (Signed)
Patient continues to do well with respect to coronary disease, now nearly 12 years out from CABG surgery and with a recent negative pharmacologic stress nuclear examination.  This has been the result despite very suboptimal control of cardiovascular risk factors.  Patient agrees to more frequent visits in an attempt to better control blood pressure and hyperlipidemia.

## 2012-01-07 NOTE — Assessment & Plan Note (Signed)
Patient appears to have developed psoriasis.  Triamcinolone cream given with advice to schedule an appointment with a dermatologist.

## 2012-01-07 NOTE — Patient Instructions (Signed)
Your physician recommends that you schedule a follow-up appointment in: 6 months with Dr Dietrich Pates and 1 month for Blood Pressure check  Your physician has recommended you make the following change in your medication:  1 - Diabetic Shoes 2 - START Amlodipine (Norvasc) 5 mg daily 3 - START Lisinopril/HCT 20/12.5 daily after your current bottle of enalapril is finished then STOP enalapril 4 - START Pravachol 80 mg daily 5 - STOP Isosorbide 6 - STOP Pravastatin 7 - START Kenalog cream to affected area twice a day  Your physician has requested that you regularly monitor and record your blood pressure readings at home. Please use the same machine at the same time of day to check your readings and record them to bring to your follow-up visit.

## 2012-01-07 NOTE — Assessment & Plan Note (Addendum)
Blood pressure control has been poor.  Amlodipine 5 mg per day will be added to her medical regime, which limits the use of simvastatin; accordingly, pravastatin will be the initial choice for treatment of hyperlipidemia..  Enalapril will be changed to lisinopril/HCT and a repeat chemistry profile in one month.  Patient will check blood pressure at local pharmacies and return in one month for a visit with the cardiology nurses.

## 2012-01-07 NOTE — Assessment & Plan Note (Signed)
Most recent lipid profile was suboptimal more than a year ago.  Lipid-lowering treatment at that time is uncertain.  Since she cannot afford rosuvastatin, we will start pravastatin at maximal dose and check a lipid profile in one month.

## 2012-01-24 IMAGING — CR DG CHEST 1V PORT
1 series · 1 of 1 positions shown · non-contrast
Comparison: 10/30/2009.

CLINICAL DATA: Chest pain and short of breath.

PORTABLE CHEST - 1 VIEW

[view not recorded]
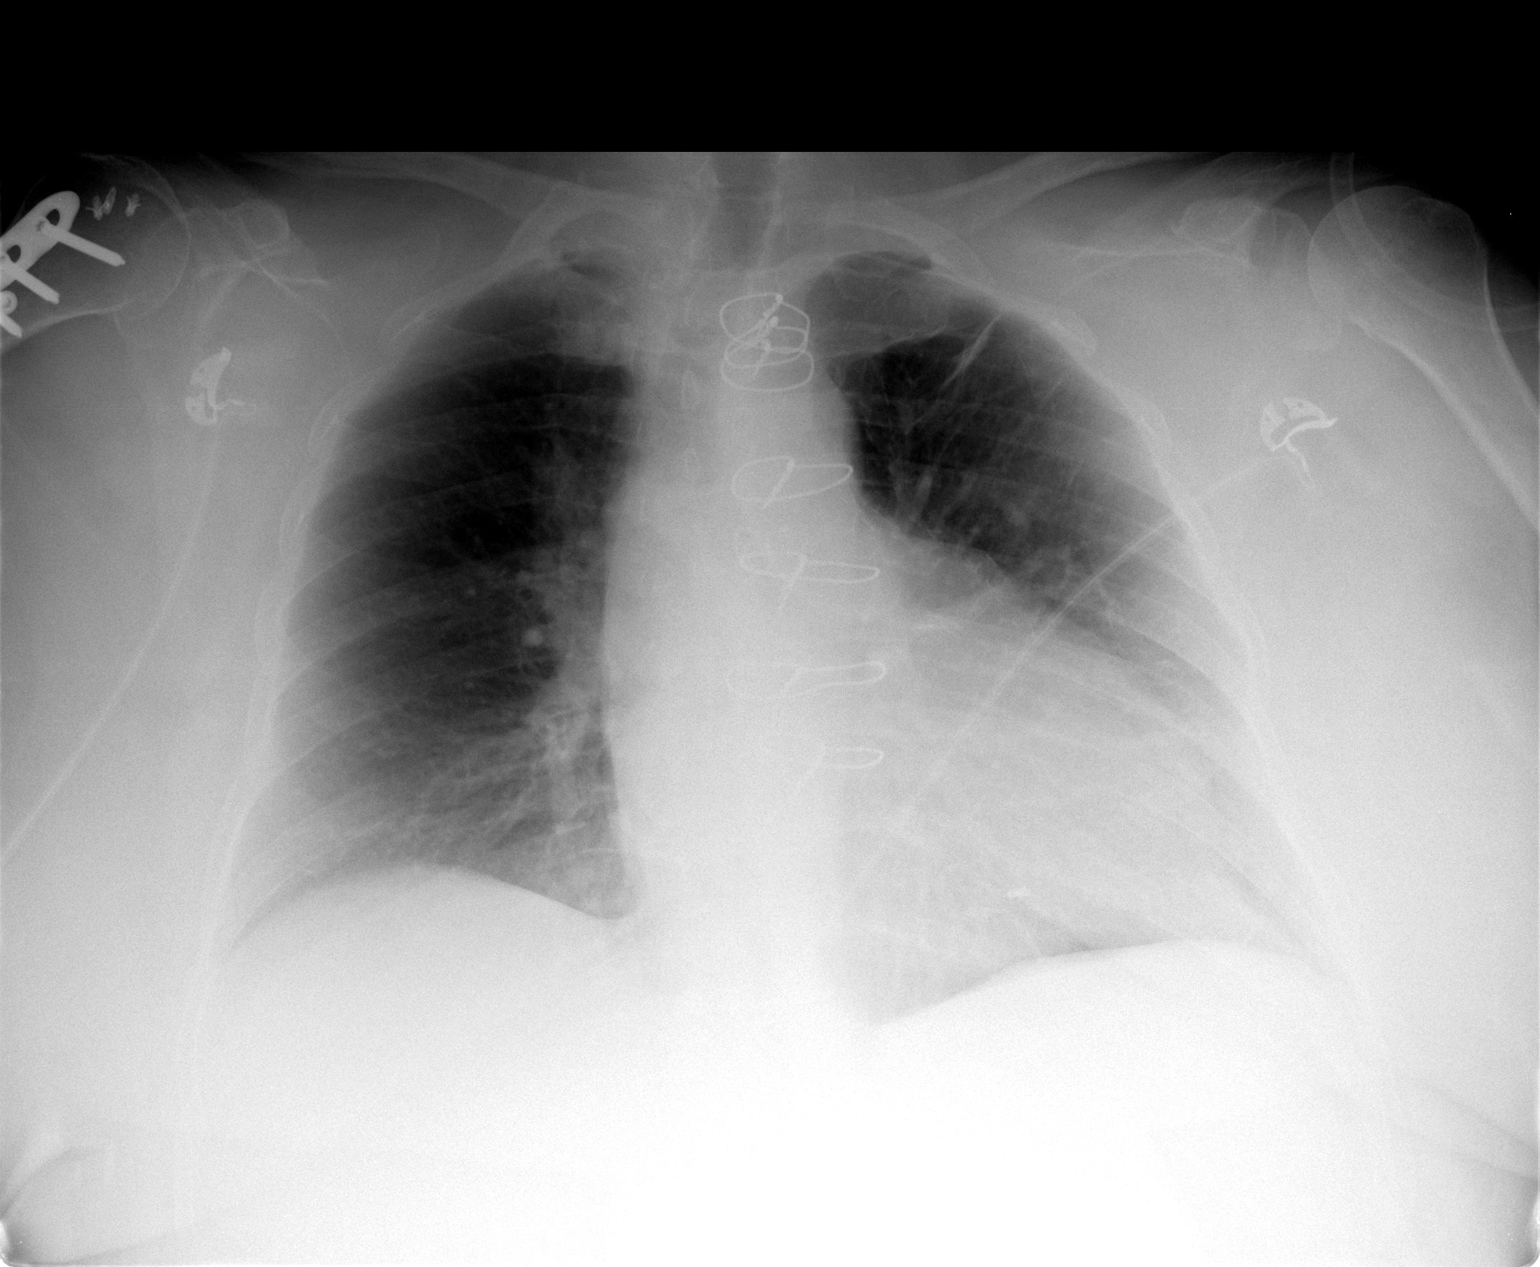

[1 of 1 positions shown; findings below may reference images not displayed]

FINDINGS: Postop CABG.  The heart is enlarged.  There is no heart
failure and the lungs are clear. Left upper lobe scar is unchanged.
IMPRESSION: No acute cardiopulmonary disease.

## 2012-01-24 IMAGING — US US EXTREM LOW VENOUS*L*
1 series · 14 of 24 positions shown · non-contrast
Comparison: None.

CLINICAL DATA: Chest pain and shortness of breath.  Left calf
pain.

LEFT LOWER EXTREMITY VENOUS DOPPLER ULTRASOUND 01/19/2010:
TECHNIQUE: Gray-scale sonography with compression, as well as
color and duplex Doppler ultrasound, were performed to evaluate the
deep venous system from the level of the common femoral vein
through the popliteal and proximal calf veins. Evaluation also
included physiologic response to augmentation.

[Series 1: unknown · 14 of 30 slices shown]
[im 1/30]
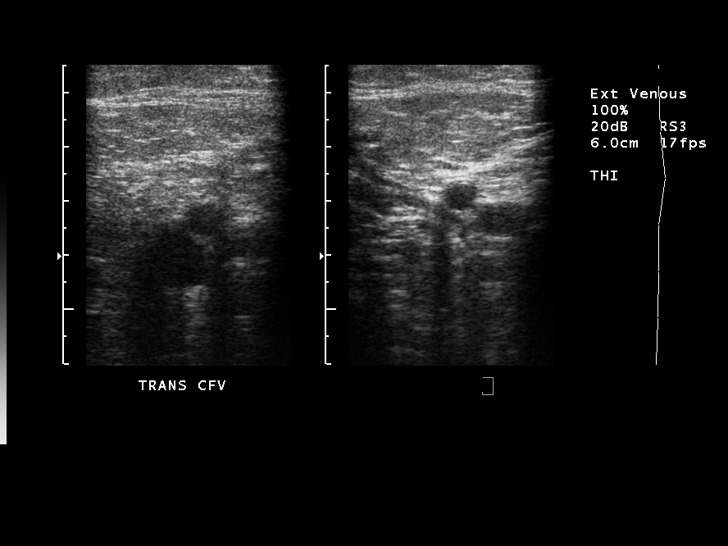
[im 3/30]
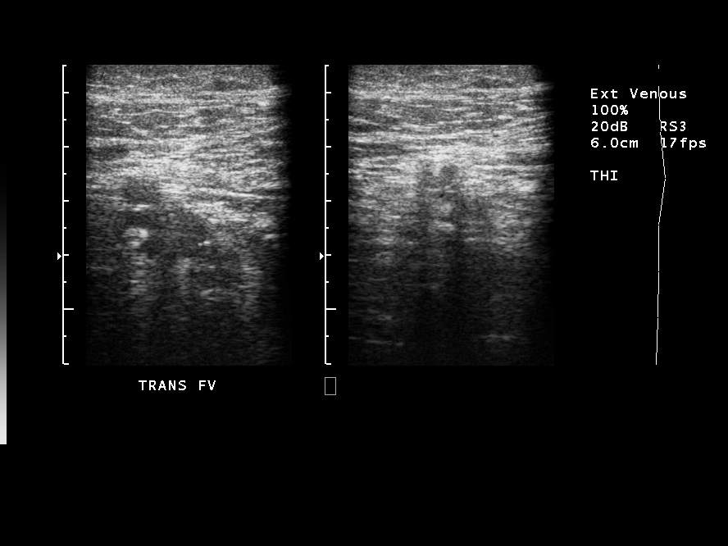
[im 6/30]
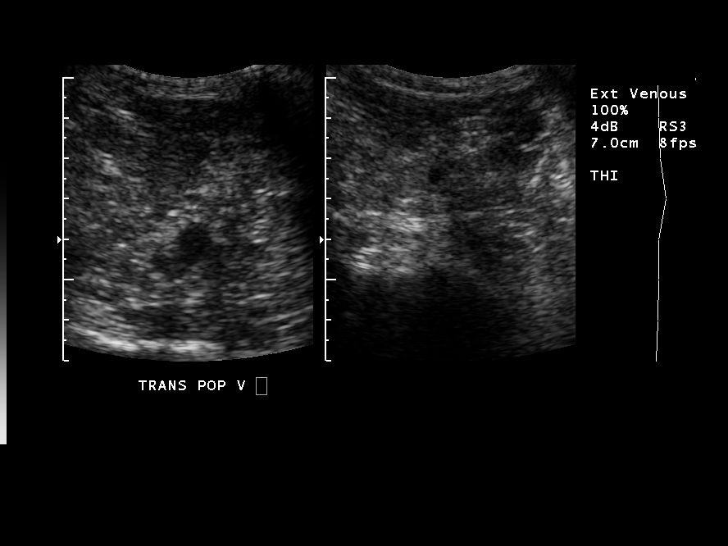
[im 8/30]
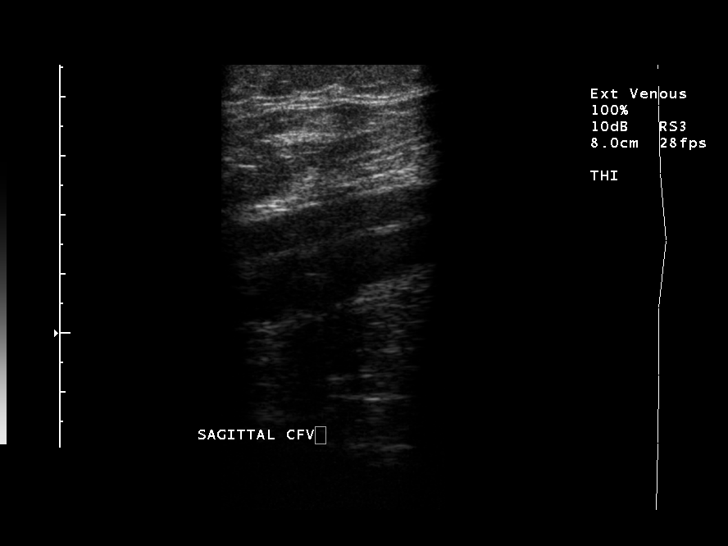
[im 9/30]
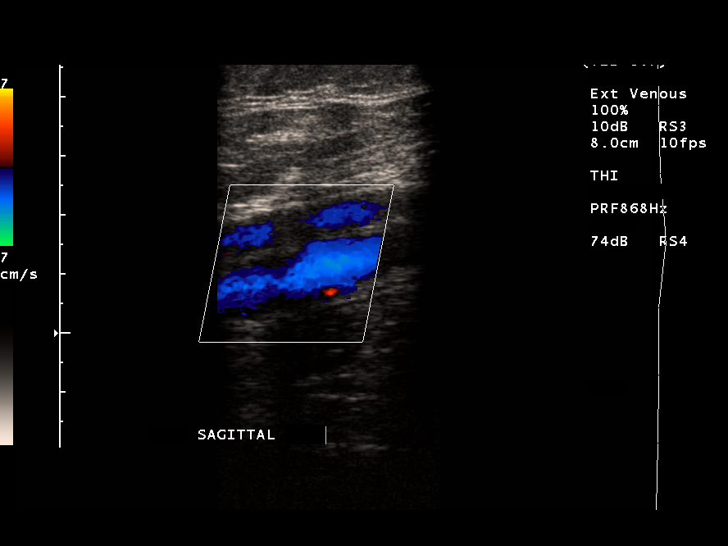
[im 12/30]
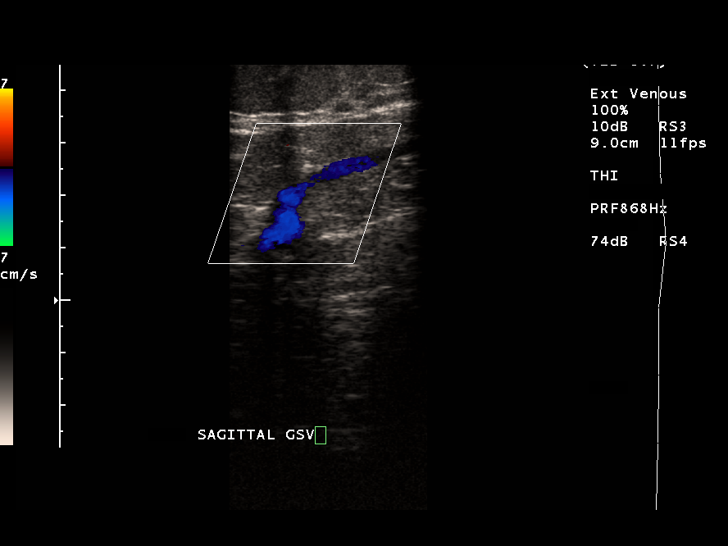
[im 14/30]
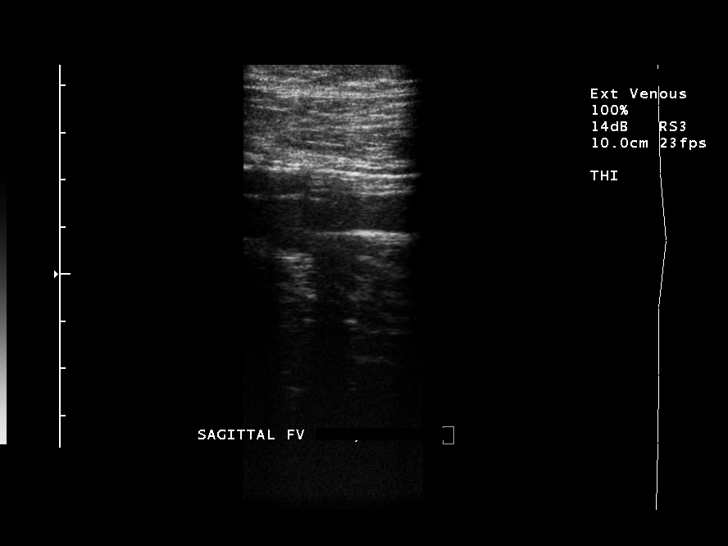
[im 16/30]
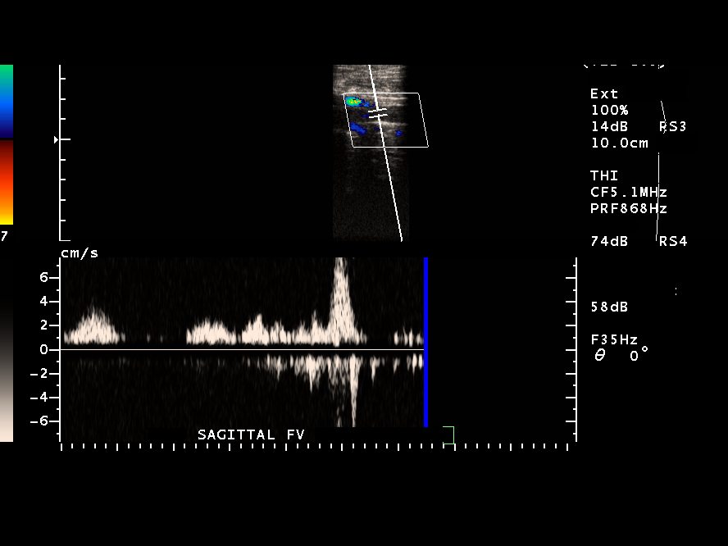
[im 18/30]
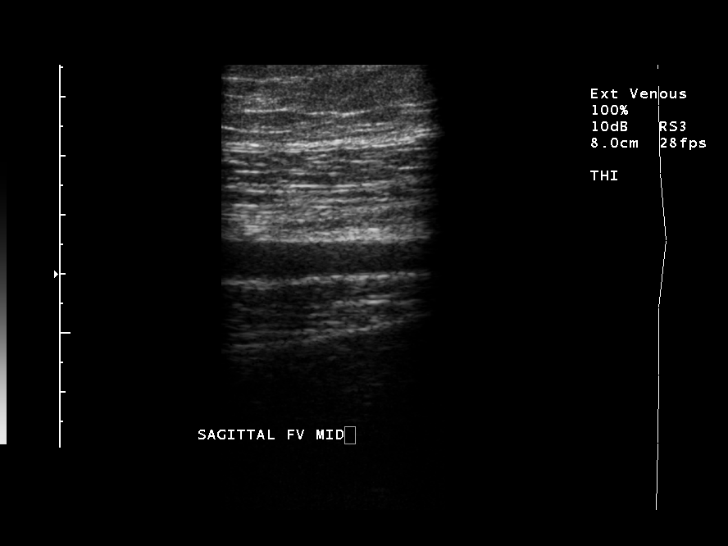
[im 21/30]
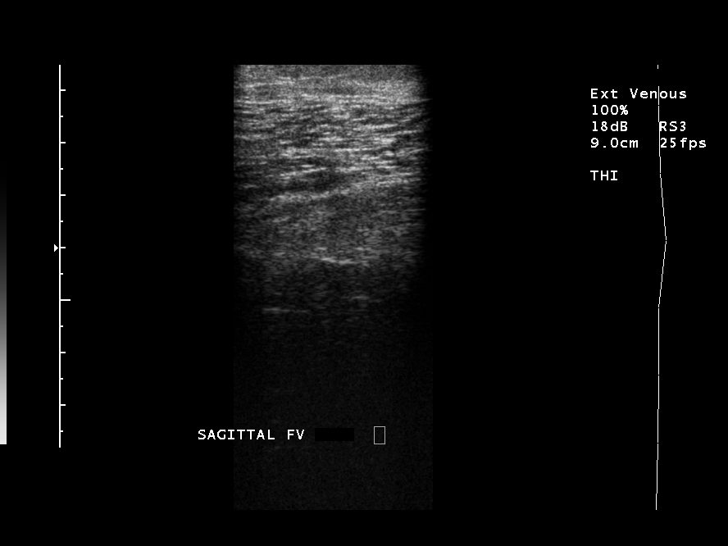
[im 23/30]
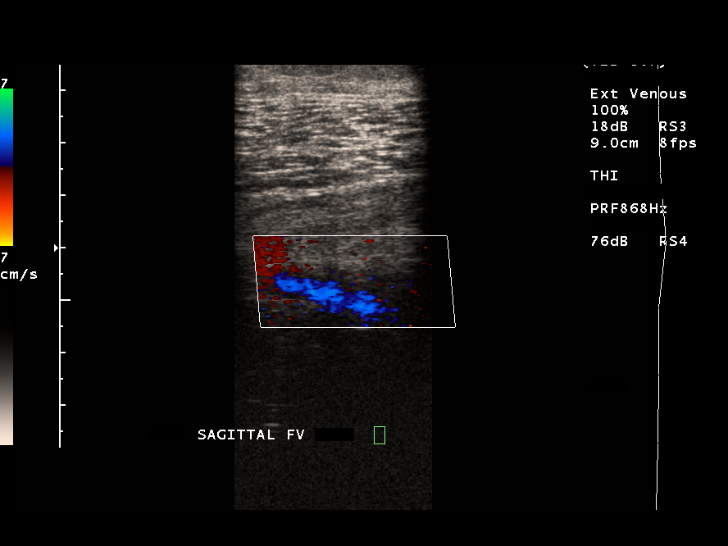
[im 24/30]
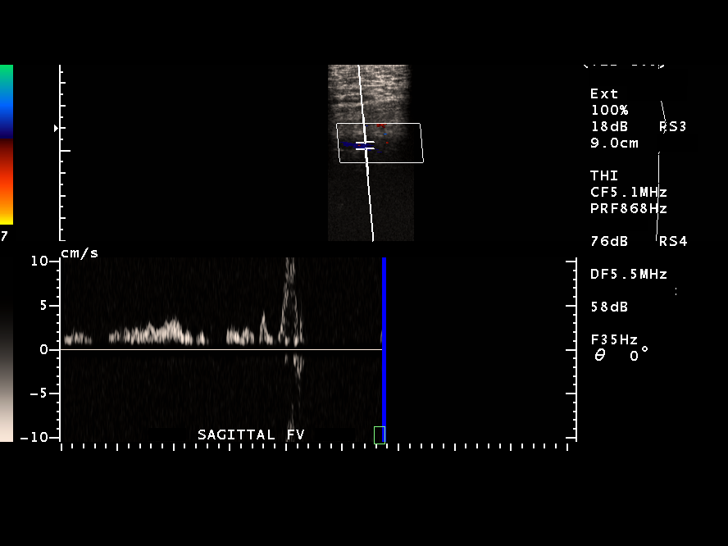
[im 27/30]
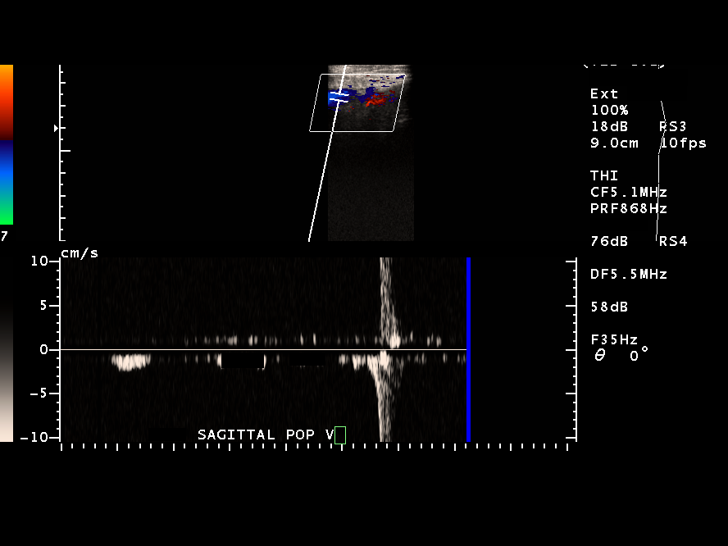
[im 30/30]
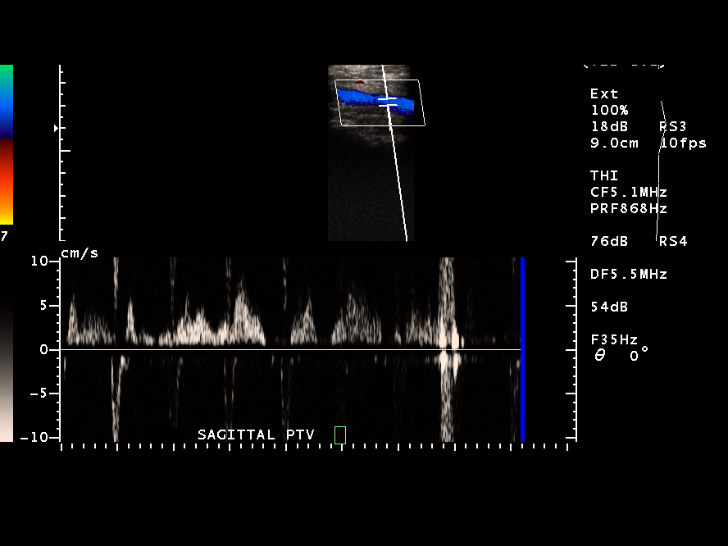

[14 of 24 positions shown; findings below may reference images not displayed]

FINDINGS: All of the visualized deep veins in the left lower
extremity demonstrate normal Doppler signal, normal
compressibility, normal phasicity, and normal physiologic response
to augmentation.  No gray scale filling defects were identified.

The visualized greater saphenous vein in the thigh is similarly
patent.
IMPRESSION: No evidence of left lower extremity DVT.

## 2012-01-29 ENCOUNTER — Other Ambulatory Visit: Payer: Self-pay

## 2012-01-29 ENCOUNTER — Emergency Department (HOSPITAL_COMMUNITY): Payer: Medicare Other

## 2012-01-29 ENCOUNTER — Inpatient Hospital Stay (HOSPITAL_COMMUNITY)
Admission: EM | Admit: 2012-01-29 | Discharge: 2012-02-02 | DRG: 064 | Disposition: A | Discharge status: Home / Self Care | Payer: Medicare Other | Attending: Family Medicine | Admitting: Family Medicine

## 2012-01-29 ENCOUNTER — Encounter (HOSPITAL_COMMUNITY): Payer: Self-pay | Admitting: *Deleted

## 2012-01-29 DIAGNOSIS — Z888 Allergy status to other drugs, medicaments and biological substances status: Secondary | ICD-10-CM

## 2012-01-29 DIAGNOSIS — Z8673 Personal history of transient ischemic attack (TIA), and cerebral infarction without residual deficits: Secondary | ICD-10-CM

## 2012-01-29 DIAGNOSIS — N390 Urinary tract infection, site not specified: Secondary | ICD-10-CM | POA: Diagnosis present

## 2012-01-29 DIAGNOSIS — G609 Hereditary and idiopathic neuropathy, unspecified: Secondary | ICD-10-CM | POA: Diagnosis present

## 2012-01-29 DIAGNOSIS — I2510 Atherosclerotic heart disease of native coronary artery without angina pectoris: Secondary | ICD-10-CM | POA: Diagnosis present

## 2012-01-29 DIAGNOSIS — J449 Chronic obstructive pulmonary disease, unspecified: Secondary | ICD-10-CM | POA: Diagnosis present

## 2012-01-29 DIAGNOSIS — G4733 Obstructive sleep apnea (adult) (pediatric): Secondary | ICD-10-CM | POA: Diagnosis present

## 2012-01-29 DIAGNOSIS — J4489 Other specified chronic obstructive pulmonary disease: Secondary | ICD-10-CM | POA: Diagnosis present

## 2012-01-29 DIAGNOSIS — G9340 Encephalopathy, unspecified: Secondary | ICD-10-CM | POA: Diagnosis present

## 2012-01-29 DIAGNOSIS — E669 Obesity, unspecified: Secondary | ICD-10-CM | POA: Diagnosis present

## 2012-01-29 DIAGNOSIS — M549 Dorsalgia, unspecified: Secondary | ICD-10-CM | POA: Diagnosis present

## 2012-01-29 DIAGNOSIS — I6350 Cerebral infarction due to unspecified occlusion or stenosis of unspecified cerebral artery: Principal | ICD-10-CM | POA: Diagnosis present

## 2012-01-29 DIAGNOSIS — Z794 Long term (current) use of insulin: Secondary | ICD-10-CM

## 2012-01-29 DIAGNOSIS — Z79899 Other long term (current) drug therapy: Secondary | ICD-10-CM

## 2012-01-29 DIAGNOSIS — Z7902 Long term (current) use of antithrombotics/antiplatelets: Secondary | ICD-10-CM

## 2012-01-29 DIAGNOSIS — I509 Heart failure, unspecified: Secondary | ICD-10-CM | POA: Diagnosis present

## 2012-01-29 DIAGNOSIS — A498 Other bacterial infections of unspecified site: Secondary | ICD-10-CM | POA: Diagnosis present

## 2012-01-29 DIAGNOSIS — Z951 Presence of aortocoronary bypass graft: Secondary | ICD-10-CM

## 2012-01-29 DIAGNOSIS — Z9181 History of falling: Secondary | ICD-10-CM

## 2012-01-29 DIAGNOSIS — E119 Type 2 diabetes mellitus without complications: Secondary | ICD-10-CM | POA: Diagnosis present

## 2012-01-29 DIAGNOSIS — Z7982 Long term (current) use of aspirin: Secondary | ICD-10-CM

## 2012-01-29 DIAGNOSIS — I10 Essential (primary) hypertension: Secondary | ICD-10-CM | POA: Diagnosis present

## 2012-01-29 DIAGNOSIS — G319 Degenerative disease of nervous system, unspecified: Secondary | ICD-10-CM | POA: Diagnosis present

## 2012-01-29 DIAGNOSIS — G8929 Other chronic pain: Secondary | ICD-10-CM | POA: Diagnosis present

## 2012-01-29 DIAGNOSIS — I639 Cerebral infarction, unspecified: Secondary | ICD-10-CM

## 2012-01-29 LAB — DIFFERENTIAL
Basophils Relative: 0 % (ref 0–1)
Eosinophils Absolute: 0.5 10*3/uL (ref 0.0–0.7)
Eosinophils Relative: 5 % (ref 0–5)
Lymphs Abs: 3.6 10*3/uL (ref 0.7–4.0)
Neutrophils Relative %: 56 % (ref 43–77)

## 2012-01-29 LAB — CBC
MCH: 28.6 pg (ref 26.0–34.0)
MCHC: 32.3 g/dL (ref 30.0–36.0)
MCV: 88.5 fL (ref 78.0–100.0)
Platelets: 230 10*3/uL (ref 150–400)
RBC: 4.79 MIL/uL (ref 3.87–5.11)
RDW: 12.9 % (ref 11.5–15.5)

## 2012-01-29 LAB — COMPREHENSIVE METABOLIC PANEL
ALT: 16 U/L (ref 0–35)
Albumin: 3.5 g/dL (ref 3.5–5.2)
Calcium: 10 mg/dL (ref 8.4–10.5)
GFR calc Af Amer: 68 mL/min — ABNORMAL LOW (ref >90)
Glucose, Bld: 252 mg/dL — ABNORMAL HIGH (ref 70–99)
Sodium: 136 mEq/L (ref 135–145)
Total Protein: 6.8 g/dL (ref 6.0–8.3)

## 2012-01-29 MED ORDER — INSULIN ASPART 100 UNIT/ML ~~LOC~~ SOLN
0.0000 [IU] | Freq: Three times a day (TID) | SUBCUTANEOUS | Status: DC
  Administered 2012-01-30 (×2): 11 [IU] via SUBCUTANEOUS
  Administered 2012-01-31: 7 [IU] via SUBCUTANEOUS
  Administered 2012-01-31: 11 [IU] via SUBCUTANEOUS
  Administered 2012-01-31 – 2012-02-01 (×3): 7 [IU] via SUBCUTANEOUS
  Administered 2012-02-01: 4 [IU] via SUBCUTANEOUS
  Administered 2012-02-02: 11 [IU] via SUBCUTANEOUS
  Filled 2012-01-29: qty 3

## 2012-01-29 MED ORDER — METOCLOPRAMIDE HCL 5 MG/ML IJ SOLN
10.0000 mg | Freq: Once | INTRAMUSCULAR | Status: AC
  Administered 2012-01-29: 10 mg via INTRAVENOUS
  Filled 2012-01-29: qty 2

## 2012-01-29 MED ORDER — SODIUM CHLORIDE 0.9 % IV SOLN
Freq: Once | INTRAVENOUS | Status: AC
  Administered 2012-01-29: 18:00:00 via INTRAVENOUS

## 2012-01-29 MED ORDER — INSULIN GLARGINE 100 UNIT/ML ~~LOC~~ SOLN
15.0000 [IU] | Freq: Every day | SUBCUTANEOUS | Status: DC
  Administered 2012-01-30 – 2012-01-31 (×3): 15 [IU] via SUBCUTANEOUS
  Filled 2012-01-29: qty 3

## 2012-01-29 MED ORDER — CLOPIDOGREL BISULFATE 75 MG PO TABS
75.0000 mg | ORAL_TABLET | Freq: Every day | ORAL | Status: DC
  Administered 2012-01-30 – 2012-01-31 (×2): 75 mg via ORAL
  Filled 2012-01-29 (×2): qty 1

## 2012-01-29 MED ORDER — INSULIN ASPART 100 UNIT/ML ~~LOC~~ SOLN
0.0000 [IU] | Freq: Every day | SUBCUTANEOUS | Status: DC
  Administered 2012-01-30 (×2): 3 [IU] via SUBCUTANEOUS
  Administered 2012-01-31: 2 [IU] via SUBCUTANEOUS
  Administered 2012-02-01: 3 [IU] via SUBCUTANEOUS

## 2012-01-29 MED ORDER — ONDANSETRON HCL 4 MG/2ML IJ SOLN
4.0000 mg | Freq: Once | INTRAMUSCULAR | Status: AC
  Administered 2012-01-29: 4 mg via INTRAVENOUS
  Filled 2012-01-29: qty 2

## 2012-01-29 MED ORDER — DIPHENHYDRAMINE HCL 50 MG/ML IJ SOLN
50.0000 mg | Freq: Once | INTRAMUSCULAR | Status: AC
  Administered 2012-01-29: 50 mg via INTRAVENOUS
  Filled 2012-01-29: qty 1

## 2012-01-29 NOTE — ED Notes (Signed)
Pt states that she had a pain in her head on Saturday and has had a headache since. Pt states that 2 hours ago she felt like she had a thump to the left side of her head. Now c/o numbness on the right side of her mouth and lip. Slight droop noted on right side of mouth. Slight weakness noted in right hand. Pt also feels lightheaded and dizzy.

## 2012-01-29 NOTE — ED Notes (Signed)
Pt co nausea 8/10. MD aware.

## 2012-01-29 NOTE — ED Notes (Signed)
Tele-Neuro initiated - MD doing face to face discussion

## 2012-01-29 NOTE — H&P (Signed)
[name] [name] MRN: [id number] DOB/AGE: Jul 18, 1936 76 y.o. Primary Care Physician:MCINNIS,ANGUS G, MD, MD Admit date: 01/29/2012 Chief Complaint: Stroke HPI: This is a 76 year old who said that she had an episode about 4 days ago with what she describes as a "thump" in her head and then she developed some numbness in her lips. She then had another episode today and had more numbness in her lips. She has had a previous stroke and had similar symptoms with that. While she was in the emergency room she had a CT of the brain which did not show a definite stroke and an MRI of the brain which did not show a definite stroke. She had telemetry neurology consultation it was felt that perhaps she had had a brainstem stroke and that she should be brought into the hospital and have an MRA in the morning. She said that she stopped her aspirin several weeks ago.  Past Medical History  Diagnosis Date  . Arteriosclerotic cardiovascular disease (ASCVD)      CABG surgery in 2001; nl stress nuclear-2007; angiography in 01/2006- TO of LAD; 70% ostial stenosis      of a small ramus; patent grafts; normal EF; cath in 12/2009 essentially unchanged; EF-66%;  Congestive heart failure with preserved LV systolic function  . Hypertension   . Obstructive sleep apnea   . Hyperthyroidism   . Cerebrovascular disease     H/o CVA  . Chronic back pain   . Vestibular neuritis     ataxia  . Varicose veins   . Diabetes mellitus     Requires insulin  . Acute renal failure     Possibly contrast-induced  . Obesity   . Chronic obstructive pulmonary disease     Chronic bronchitis  . Fall 2009    right humeral fracture  . Peripheral neuropathy     Left lower extremity; possibly radiculopathy   Past Surgical History  Procedure Date  . Coronary artery bypass graft 6/01    SVG to diagonal, LIMA to LAD  . Rotator cuff repair   . Appendectomy   . Cholecystectomy   . Total abdominal hysterectomy   . Lumbar spine surgery     X2    . Knee surgery     Arthroscopic  . Carpal tunnel release     Bilateral  . Orif humerus fracture 2009        Family History  Problem Relation Age of Onset  . Coronary artery disease      Multiple first and second-degree relatives  . Aneurysm      Cerebral circulation  . Hypertension Mother   . Colon cancer Sister   . Clotting disorder      Children diagnosed with hypercoagulable state    Social History:  reports that she has never smoked. She has never used smokeless tobacco. She reports that she does not drink alcohol or use illicit drugs.   Allergies:  Allergies  Allergen Reactions  . Codeine Itching    Medications Prior to Admission  Medication Dose Route Frequency Provider Last Rate Last Dose  . 0.9 %  sodium chloride infusion   Intravenous Once Ward Givens, MD 100 mL/hr at 01/29/12 1733    . clopidogrel (PLAVIX) tablet 75 mg  75 mg Oral Q breakfast Fredirick Maudlin, MD      . diphenhydrAMINE (BENADRYL) injection 50 mg  50 mg Intravenous Once Ward Givens, MD   50 mg at 01/29/12 1857  . insulin aspart (novoLOG)  injection 0-20 Units  0-20 Units Subcutaneous TID WC Fredirick Maudlin, MD      . insulin aspart (novoLOG) injection 0-5 Units  0-5 Units Subcutaneous QHS Fredirick Maudlin, MD      . insulin glargine (LANTUS) injection 15 Units  15 Units Subcutaneous QHS Fredirick Maudlin, MD      . metoCLOPramide (REGLAN) injection 10 mg  10 mg Intravenous Once Ward Givens, MD   10 mg at 01/29/12 1857  . ondansetron (ZOFRAN) injection 4 mg  4 mg Intravenous Once Fredirick Maudlin, MD   4 mg at 01/29/12 1821   Medications Prior to Admission  Medication Sig Dispense Refill  . amLODipine (NORVASC) 5 MG tablet Take 1 tablet (5 mg total) by mouth daily.  90 tablet  3  . exenatide (BYETTA 5 MCG PEN) 5 MCG/0.02ML SOLN Inject 10 mcg into the skin 2 (two) times daily with a meal.       . HYDROcodone-acetaminophen (LORTAB) 10-500 MG per tablet Take 0.5-1 tablets by mouth Once daily as  needed. For pain      . insulin glargine (LANTUS SOLOSTAR) 100 UNIT/ML injection Inject 60 Units into the skin at bedtime.        . insulin glulisine (APIDRA) 100 UNIT/ML injection Inject 20 Units into the skin 3 (three) times daily before meals.        [name] levothyroxine (SYNTHROID, LEVOTHROID) 25 MCG tablet Take 25 mcg by mouth daily.        [name] lisinopril-hydrochlorothiazide (PRINZIDE,ZESTORETIC) 20-12.5 MG per tablet Take 1 tablet by mouth daily.  90 tablet  3  . metFORMIN (GLUCOPHAGE) 500 MG tablet Take 500 mg by mouth 2 (two) times daily with a meal.        . pravastatin (PRAVACHOL) 80 MG tablet Take 1 tablet (80 mg total) by mouth daily.  90 tablet  3  . triamcinolone cream (KENALOG) 0.1 % Apply topically 2 (two) times daily.  30 g  3  . nitroGLYCERIN (NITROSTAT) 0.4 MG SL tablet Place 0.4 mg under the tongue every 5 (five) minutes as needed.             ZOX:WRUEA from the symptoms mentioned above,there are no other symptoms referable to all systems reviewed.  Physical Exam: Blood pressure 133/32, pulse 72, temperature 98.9 F (37.2 C), temperature source Oral, resp. rate 18, height 5\' 4"  (1.626 m), weight 95.255 kg (210 lb), SpO2 96.00%. She is awake and alert. She has a left facial droop that may be chronic from her previous stroke. She has mild left arm and left leg weakness. She says that her speech seemed somewhat slurred to her but it's difficult for me to be certain about that. She has a good gag reflex. Her pupils are reactive. Her nose and throat are clear. Her neck is supple without masses bruits or JVD. Her chest is clear. Her heart is regular without murmur gallop or rub. Her abdomen is soft obese without masses. Her bowel sounds are present and active. Her extremities showed no edema. Central nervous system examination is as described above. Her pupils are reactive but she says she feels like she has some decreased vision in her left eye but she does not have a definite visual field  cut.    Basename 01/29/12 1725  WBC 11.2*  NEUTROABS 6.2  HGB 13.7  HCT 42.4  MCV 88.5  PLT 230    Basename 01/29/12 1725  NA 136  K 4.3  CL  100  CO2 28  GLUCOSE 252*  BUN 12  CREATININE 0.94  CALCIUM 10.0  MG --  lablast2(ast:2,ALT:2,alkphos:2,bilitot:2,prot:2,albumin:2)@    No results found for this or any previous visit (from the past 240 hour(s)).   Ct Head Wo Contrast  01/29/2012  *RADIOLOGY REPORT*  Clinical Data: Headache and weakness.  CT HEAD WITHOUT CONTRAST  Technique:  Contiguous axial images were obtained from the base of the skull through the vertex without contrast.  Comparison: None.  Findings: Small vessel ischemic changes are present in the periventricular white matter. The brain demonstrates no evidence of hemorrhage, infarction, edema, mass effect, extra-axial fluid collection, hydrocephalus or mass lesion.  The skull is unremarkable.  IMPRESSION: No acute findings.  Underlying small vessel disease.  Original Report Authenticated By: Reola Calkins, M.D.   [name] Contrast  01/29/2012  *RADIOLOGY REPORT*  Clinical Data: Right facial numbness.  Left lower extremity weakness.  MRI HEAD WITHOUT CONTRAST  Technique:  Multiplanar, multiecho pulse sequences of the brain and surrounding structures were obtained according to standard protocol without intravenous contrast.  Comparison: Head CT same day.  MRI 09/21/2007.  Findings: Diffusion imaging does not show any acute or subacute infarction.  The brain shows generalized atrophy with extensive chronic small vessel disease affecting the hemispheric white matter, the basal ganglia and thalami and brainstem.  No large vessel territory infarction is seen.  No mass lesion, hemorrhage, hydrocephalus or extra-axial collection.  No pituitary mass.  No advanced inflammatory sinus disease.  No skull or skull base lesion.  IMPRESSION: Atrophy and chronic small vessel disease, progressive since 2008. No evidence of acute or  subacute lesion.  Original Report Authenticated By: Thomasenia Sales, M.D.   Impression: I think she probably has had some sort of stroke or TIA. She has diabetes and is on multiple medications. She has hypertension. Active Problems:  * No active hospital problems. *      Plan: She'll be put him on a telemetry bed she will have neuro checks she will have a neurology consultation in the morning and I'm going ahead and order the MRA pending the neurology consultation.      Meriem Lemieux L Pager [phone]  01/29/2012, 10:15 PM

## 2012-01-29 NOTE — ED Notes (Signed)
Requesting something to drink- the nausea is gone, "my mouth is so dry"

## 2012-01-29 NOTE — ED Notes (Signed)
Family at bedside. Patient states she is feeling really nauseated and has took on a bad headache.

## 2012-01-29 NOTE — ED Provider Notes (Signed)
History     CSN: [id number]  Arrival date & time 01/29/12  1650   First MD Initiated Contact with Patient 01/29/12 1707      Chief Complaint  Patient presents with  . Headache    (Consider location/radiation/quality/duration/timing/severity/associated sxs/prior treatment) HPI  Patient relates 4 days ago she felt a thump in the left side of her head and started having a nagging headache ever since then. She relates about 2 and a  half hours prior to arrival she had acute thump in the left side of her head again. She states this time she has some numbness of her right lower lip and of her teeth and gums. She states someone said she had slurred speech but she didn't think she did. She also states she feels off balance and has a headache again. She denies any weakness of her arms or legs.  She states she had a headache about 10 years ago which was also followed by a thump in the head. She states at that time she had a facial droop and had difficulties speaking and walking and had to do physical therapy.   PCP Dr. Renard Matter Cardiologist Dr. Dietrich Pates  Past Medical History  Diagnosis Date  . Arteriosclerotic cardiovascular disease (ASCVD)      CABG surgery in 2001; nl stress nuclear-2007; angiography in 01/2006- TO of LAD; 70% ostial stenosis      of a small ramus; patent grafts; normal EF; cath in 12/2009 essentially unchanged; EF-66%;  Congestive heart failure with preserved LV systolic function  . Hypertension   . Obstructive sleep apnea   . Hyperthyroidism   . Cerebrovascular disease     H/o CVA  . Chronic back pain   . Vestibular neuritis     ataxia  . Varicose veins   . Diabetes mellitus     Requires insulin  . Acute renal failure     Possibly contrast-induced  . Obesity   . Chronic obstructive pulmonary disease     Chronic bronchitis  . Fall 2009    right humeral fracture  . Peripheral neuropathy     Left lower extremity; possibly radiculopathy    Past Surgical History    Procedure Date  . Coronary artery bypass graft 6/01    SVG to diagonal, LIMA to LAD  . Rotator cuff repair   . Appendectomy   . Cholecystectomy   . Total abdominal hysterectomy   . Lumbar spine surgery     X2  . Knee surgery     Arthroscopic  . Carpal tunnel release     Bilateral  . Orif humerus fracture 2009    Family History  Problem Relation Age of Onset  . Coronary artery disease      Multiple first and second-degree relatives  . Aneurysm      Cerebral circulation  . Hypertension Mother   . Colon cancer Sister   . Clotting disorder      Children diagnosed with hypercoagulable state    History  Substance Use Topics  . Smoking status: Never Smoker   . Smokeless tobacco: Never Used  . Alcohol Use: No   lives with spouse at home  OB History    Grav Para Term Preterm Abortions TAB SAB Ect Mult Living                  Review of Systems  All other systems reviewed and are negative.    Allergies  Codeine  Home Medications   Current  Outpatient Rx  Name Route Sig Dispense Refill  . AMLODIPINE BESYLATE 5 MG PO TABS Oral Take 1 tablet (5 mg total) by mouth daily. 90 tablet 3  . EXENATIDE 5 MCG/0.02ML [place] SOLN Subcutaneous Inject 10 mcg into the skin 2 (two) times daily with a meal.     . HYDROCODONE-ACETAMINOPHEN 10-500 MG PO TABS Oral Take 0.5-1 tablets by mouth Once daily as needed. For pain    . INSULIN GLARGINE 100 UNIT/ML [place] SOLN Subcutaneous Inject 60 Units into the skin at bedtime.      . INSULIN GLULISINE 100 UNIT/ML [place] SOLN Subcutaneous Inject 20 Units into the skin 3 (three) times daily before meals.      [name] LEVOTHYROXINE SODIUM 25 MCG PO TABS Oral Take 25 mcg by mouth daily.      [name] LISINOPRIL-HYDROCHLOROTHIAZIDE 20-12.5 MG PO TABS Oral Take 1 tablet by mouth daily. 90 tablet 3  . METFORMIN HCL 500 MG PO TABS Oral Take 500 mg by mouth 2 (two) times daily with a meal.      . NITROGLYCERIN 0.4 MG SL SUBL Sublingual Place 0.4 mg under the tongue every 5  (five) minutes as needed.      [name] PRAVASTATIN SODIUM 80 MG PO TABS Oral Take 1 tablet (80 mg total) by mouth daily. 90 tablet 3  . TRIAMCINOLONE ACETONIDE 0.1 % EX CREA Topical Apply topically 2 (two) times daily. 30 g 3    BP 133/32  Pulse 72  Temp(Src) 98 F (36.7 C) (Oral)  Resp 18  Ht 5\' 4"  (1.626 m)  Wt 210 lb (95.255 kg)  BMI 36.05 kg/m2  SpO2 96%  Vital signs normal    Physical Exam  Nursing note and vitals reviewed. Constitutional: She is oriented to person, place, and time. She appears well-developed and well-nourished.  Non-toxic appearance. She does not appear ill. No distress.       Obese  HENT:  Head: Normocephalic and atraumatic.  Right Ear: External ear normal.  Left Ear: External ear normal.  Nose: Nose normal. No mucosal edema or rhinorrhea.  Mouth/Throat: Oropharynx is clear and moist and mucous membranes are normal. No dental abscesses or uvula swelling.  Eyes: Conjunctivae and EOM are normal. Pupils are equal, round, and reactive to light.  Neck: Normal range of motion and full passive range of motion without pain. Neck supple.  Cardiovascular: Normal rate, regular rhythm and normal heart sounds.  Exam reveals no gallop and no friction rub.   No murmur heard. Pulmonary/Chest: Effort normal and breath sounds normal. No respiratory distress. She has no wheezes. She has no rhonchi. She has no rales. She exhibits no tenderness and no crepitus.  Abdominal: Soft. Normal appearance and bowel sounds are normal. She exhibits no distension. There is no tenderness. There is no rebound and no guarding.  Musculoskeletal: Normal range of motion. She exhibits no edema and no tenderness.       Moves all extremities well.   Neurological: She is alert and oriented to person, place, and time. She has normal strength.       Patient has questionable facial droop on the left, her eyebrows are intact. Patient has a weaker grip on the right than the left but states she had a tendon  transfer in her right hand and it may always be a little weak. She had more difficulty raising her left leg against gravity and maintain it than the right which she states is new. She had no pronator drift.  Skin:  Skin is warm, dry and intact. No rash noted. No erythema. No pallor.  Psychiatric: She has a normal mood and affect. Her speech is normal and behavior is normal. Her mood appears not anxious.    ED Course  Procedures (including critical care time)  Code stroke was considered but not done because of her minor physical findings.   PT had negative head CT but her symptoms were suggestive of a stroke so MR was done.   21:07 Dr Gonzella Lex teleneurology will consult.   21:30 Dr Gonzella Lex has examined patient and reviewed her CT and MRI, feels she has had a small brainstem stroke by exam and can be admitted to get MRA of brain and carotids. States pt told him she stopped her ASA a few weeks ago for no reason  21:36 Dr Juanetta Gosling will come see patient and admit.   Results for orders placed during the hospital encounter of 01/29/12  CBC      Component Value Range   WBC 11.2 (*) 4.0 - 10.5 (K/uL)   RBC 4.79  3.87 - 5.11 (MIL/uL)   Hemoglobin 13.7  12.0 - 15.0 (g/dL)   HCT 78.2  95.6 - 21.3 (%)   MCV 88.5  78.0 - 100.0 (fL)   MCH 28.6  26.0 - 34.0 (pg)   MCHC 32.3  30.0 - 36.0 (g/dL)   RDW 08.6  57.8 - 46.9 (%)   Platelets 230  150 - 400 (K/uL)  DIFFERENTIAL      Component Value Range   Neutrophils Relative 56  43 - 77 (%)   Neutro Abs 6.2  1.7 - 7.7 (K/uL)   Lymphocytes Relative 32  12 - 46 (%)   Lymphs Abs 3.6  0.7 - 4.0 (K/uL)   Monocytes Relative 7  3 - 12 (%)   Monocytes Absolute 0.8  0.1 - 1.0 (K/uL)   Eosinophils Relative 5  0 - 5 (%)   Eosinophils Absolute 0.5  0.0 - 0.7 (K/uL)   Basophils Relative 0  0 - 1 (%)   Basophils Absolute 0.1  0.0 - 0.1 (K/uL)  COMPREHENSIVE METABOLIC PANEL      Component Value Range   Sodium 136  135 - 145 (mEq/L)   Potassium 4.3  3.5 - 5.1  (mEq/L)   Chloride 100  96 - 112 (mEq/L)   CO2 28  19 - 32 (mEq/L)   Glucose, Bld 252 (*) 70 - 99 (mg/dL)   BUN 12  6 - 23 (mg/dL)   Creatinine, Ser 6.29  0.50 - 1.10 (mg/dL)   Calcium 52.8  8.4 - 10.5 (mg/dL)   Total Protein 6.8  6.0 - 8.3 (g/dL)   Albumin 3.5  3.5 - 5.2 (g/dL)   AST 15  0 - 37 (U/L)   ALT 16  0 - 35 (U/L)   Alkaline Phosphatase 85  39 - 117 (U/L)   Total Bilirubin 0.3  0.3 - 1.2 (mg/dL)   GFR calc non Af Amer 59 (*) >90 (mL/min)   GFR calc Af Amer 68 (*) >90 (mL/min)  APTT      Component Value Range   aPTT 33  24 - 37 (seconds)  PROTIME-INR      Component Value Range   Prothrombin Time 13.4  11.6 - 15.2 (seconds)   INR 1.00  0.00 - 1.49   POCT I-STAT TROPONIN I      Component Value Range   Troponin i, poc 0.00  0.00 - 0.08 (ng/mL)   Comment  3            Laboratory interpretation all normal except mild leukocytosis, hyperglycemia   Ct Head Wo Contrast  01/29/2012  *RADIOLOGY REPORT*  Clinical Data: Headache and weakness.  CT HEAD WITHOUT CONTRAST  Technique:  Contiguous axial images were obtained from the base of the skull through the vertex without contrast.  Comparison: None.  Findings: Small vessel ischemic changes are present in the periventricular white matter. The brain demonstrates no evidence of hemorrhage, infarction, edema, mass effect, extra-axial fluid collection, hydrocephalus or mass lesion.  The skull is unremarkable.  IMPRESSION: No acute findings.  Underlying small vessel disease.  Original Report Authenticated By: Reola Calkins, M.D.   [name] Contrast  01/29/2012  *RADIOLOGY REPORT*  Clinical Data: Right facial numbness.  Left lower extremity weakness.  MRI HEAD WITHOUT CONTRAST  Technique:  Multiplanar, multiecho pulse sequences of the brain and surrounding structures were obtained according to standard protocol without intravenous contrast.  Comparison: Head CT same day.  MRI 09/21/2007.  Findings: Diffusion imaging does not show any  acute or subacute infarction.  The brain shows generalized atrophy with extensive chronic small vessel disease affecting the hemispheric white matter, the basal ganglia and thalami and brainstem.  No large vessel territory infarction is seen.  No mass lesion, hemorrhage, hydrocephalus or extra-axial collection.  No pituitary mass.  No advanced inflammatory sinus disease.  No skull or skull base lesion.  IMPRESSION: Atrophy and chronic small vessel disease, progressive since 2008. No evidence of acute or subacute lesion.  Original Report Authenticated By: Thomasenia Sales, M.D.      Date: 01/29/2012  Rate: 72  Rhythm: normal sinus rhythm  QRS Axis: left  Intervals: normal  ST/T Wave abnormalities: nonspecific ST/T changes  Conduction Disutrbances:none  Narrative Interpretation: Q waves anterior chest leads  Old EKG Reviewed: none available   Diagnoses that have been ruled out:  None  Diagnoses that are still under consideration:  None  Final diagnoses:  Stroke   Plan admission   Devoria Albe, MD, FACEP   CRITICAL CARE Performed by: Devoria Albe L   Total critical care time: 31 minutes  Critical care time was exclusive of separately billable procedures and treating other patients.  Critical care was necessary to treat or prevent imminent or life-threatening deterioration.  Critical care was time spent personally by me on the following activities: development of treatment plan with patient and/or surrogate as well as nursing, discussions with consultants, evaluation of patient's response to treatment, examination of patient, obtaining history from patient or surrogate, ordering and performing treatments and interventions, ordering and review of laboratory studies, ordering and review of radiographic studies, pulse oximetry and re-evaluation of patient's condition.     MDM          Ward Givens, MD 01/29/12 2144

## 2012-01-30 ENCOUNTER — Inpatient Hospital Stay (HOSPITAL_COMMUNITY): Payer: Medicare Other

## 2012-01-30 LAB — GLUCOSE, CAPILLARY: Glucose-Capillary: 248 mg/dL — ABNORMAL HIGH (ref 70–99)

## 2012-01-30 LAB — CBC
HCT: 40.3 % (ref 36.0–46.0)
Hemoglobin: 12.9 g/dL (ref 12.0–15.0)
MCH: 28.6 pg (ref 26.0–34.0)
MCHC: 32 g/dL (ref 30.0–36.0)
RBC: 4.51 MIL/uL (ref 3.87–5.11)

## 2012-01-30 LAB — URINALYSIS, ROUTINE W REFLEX MICROSCOPIC
Glucose, UA: >1000 mg/dL — AB
Ketones, ur: NEGATIVE mg/dL
Leukocytes, UA: NEGATIVE
Nitrite: POSITIVE — AB
Protein, ur: NEGATIVE mg/dL
pH: 5.5 (ref 5.0–8.0)

## 2012-01-30 LAB — BASIC METABOLIC PANEL
BUN: 13 mg/dL (ref 6–23)
CO2: 29 mEq/L (ref 19–32)
Chloride: 101 mEq/L (ref 96–112)
Glucose, Bld: 331 mg/dL — ABNORMAL HIGH (ref 70–99)
Potassium: 4.3 mEq/L (ref 3.5–5.1)
Sodium: 137 mEq/L (ref 135–145)

## 2012-01-30 LAB — URINE MICROSCOPIC-ADD ON

## 2012-01-30 LAB — TSH: TSH: 0.972 u[IU]/mL (ref 0.350–4.500)

## 2012-01-30 MED ORDER — DEXTROSE 5 % IV SOLN
1.0000 g | INTRAVENOUS | Status: DC
  Administered 2012-01-30 – 2012-02-01 (×3): 1 g via INTRAVENOUS
  Filled 2012-01-30 (×4): qty 10

## 2012-01-30 MED ORDER — ONDANSETRON HCL 4 MG PO TABS: 4.0000 mg | ORAL_TABLET | Freq: Four times a day (QID) | ORAL | Status: DC | PRN

## 2012-01-30 MED ORDER — SIMVASTATIN 20 MG PO TABS: 40.0000 mg | ORAL_TABLET | Freq: Every day | ORAL | Status: DC

## 2012-01-30 MED ORDER — ACETAMINOPHEN 650 MG RE SUPP: 650.0000 mg | Freq: Four times a day (QID) | RECTAL | Status: DC | PRN

## 2012-01-30 MED ORDER — HYDROCHLOROTHIAZIDE 12.5 MG PO CAPS
12.5000 mg | ORAL_CAPSULE | Freq: Every day | ORAL | Status: DC
  Administered 2012-01-30 – 2012-02-02 (×4): 12.5 mg via ORAL
  Filled 2012-01-30 (×4): qty 1

## 2012-01-30 MED ORDER — HYDROCODONE-ACETAMINOPHEN 5-325 MG PO TABS
1.0000 | ORAL_TABLET | ORAL | Status: DC | PRN
  Administered 2012-01-30 (×3): 1 via ORAL
  Filled 2012-01-30 (×3): qty 1

## 2012-01-30 MED ORDER — ALUM & MAG HYDROXIDE-SIMETH 200-200-20 MG/5ML PO SUSP: 30.0000 mL | Freq: Four times a day (QID) | ORAL | Status: DC | PRN

## 2012-01-30 MED ORDER — DIPHENHYDRAMINE HCL 25 MG PO CAPS
50.0000 mg | ORAL_CAPSULE | Freq: Four times a day (QID) | ORAL | Status: DC | PRN
  Administered 2012-01-30 – 2012-02-01 (×4): 50 mg via ORAL
  Filled 2012-01-30 (×4): qty 2

## 2012-01-30 MED ORDER — AMLODIPINE BESYLATE 5 MG PO TABS
5.0000 mg | ORAL_TABLET | Freq: Every day | ORAL | Status: DC
  Administered 2012-01-30 – 2012-02-02 (×4): 5 mg via ORAL
  Filled 2012-01-30 (×4): qty 1

## 2012-01-30 MED ORDER — LISINOPRIL-HYDROCHLOROTHIAZIDE 20-12.5 MG PO TABS: 1.0000 | ORAL_TABLET | Freq: Every day | ORAL | Status: DC

## 2012-01-30 MED ORDER — PHENAZOPYRIDINE HCL 100 MG PO TABS
100.0000 mg | ORAL_TABLET | Freq: Four times a day (QID) | ORAL | Status: DC
  Administered 2012-01-30 – 2012-02-02 (×12): 100 mg via ORAL
  Filled 2012-01-30 (×11): qty 1

## 2012-01-30 MED ORDER — LISINOPRIL 10 MG PO TABS
20.0000 mg | ORAL_TABLET | Freq: Every day | ORAL | Status: DC
  Administered 2012-01-30 – 2012-02-02 (×4): 20 mg via ORAL
  Filled 2012-01-30 (×4): qty 2

## 2012-01-30 MED ORDER — SODIUM CHLORIDE 0.9 % IV SOLN
INTRAVENOUS | Status: DC
  Administered 2012-01-30: 700 mL via INTRAVENOUS
  Administered 2012-01-30: 950 mL via INTRAVENOUS
  Administered 2012-01-31 – 2012-02-01 (×3): via INTRAVENOUS

## 2012-01-30 MED ORDER — HYDROCODONE-ACETAMINOPHEN 10-325 MG PO TABS
1.0000 | ORAL_TABLET | ORAL | Status: DC | PRN
  Administered 2012-01-30 – 2012-02-01 (×5): 1 via ORAL
  Filled 2012-01-30 (×5): qty 1

## 2012-01-30 MED ORDER — SODIUM CHLORIDE 0.9 % IJ SOLN
3.0000 mL | Freq: Two times a day (BID) | INTRAMUSCULAR | Status: DC
  Administered 2012-01-30 – 2012-02-01 (×6): 3 mL via INTRAVENOUS
  Filled 2012-01-30 (×6): qty 3

## 2012-01-30 MED ORDER — ASPIRIN EC 325 MG PO TBEC
325.0000 mg | DELAYED_RELEASE_TABLET | Freq: Every day | ORAL | Status: DC
  Administered 2012-01-30 – 2012-02-02 (×4): 325 mg via ORAL
  Filled 2012-01-30 (×4): qty 1

## 2012-01-30 MED ORDER — NITROGLYCERIN 0.4 MG SL SUBL: 0.4000 mg | SUBLINGUAL_TABLET | SUBLINGUAL | Status: DC | PRN

## 2012-01-30 MED ORDER — ROSUVASTATIN CALCIUM 20 MG PO TABS
10.0000 mg | ORAL_TABLET | Freq: Every day | ORAL | Status: DC
  Administered 2012-01-30 – 2012-02-01 (×3): 10 mg via ORAL
  Filled 2012-01-30 (×3): qty 1

## 2012-01-30 MED ORDER — ENOXAPARIN SODIUM 40 MG/0.4ML ~~LOC~~ SOLN
40.0000 mg | Freq: Every day | SUBCUTANEOUS | Status: DC
  Administered 2012-01-30 – 2012-02-01 (×3): 40 mg via SUBCUTANEOUS
  Filled 2012-01-30 (×4): qty 0.4

## 2012-01-30 MED ORDER — PANTOPRAZOLE SODIUM 40 MG PO TBEC
40.0000 mg | DELAYED_RELEASE_TABLET | Freq: Every day | ORAL | Status: DC
  Administered 2012-01-30 – 2012-02-01 (×3): 40 mg via ORAL
  Filled 2012-01-30 (×3): qty 1

## 2012-01-30 MED ORDER — LEVOTHYROXINE SODIUM 25 MCG PO TABS
25.0000 ug | ORAL_TABLET | Freq: Every day | ORAL | Status: DC
Start: 2012-01-30 — End: 2012-02-02
  Administered 2012-01-30 – 2012-02-02 (×4): 25 ug via ORAL
  Filled 2012-01-30 (×4): qty 1

## 2012-01-30 MED ORDER — ONDANSETRON HCL 4 MG/2ML IJ SOLN
4.0000 mg | Freq: Four times a day (QID) | INTRAMUSCULAR | Status: DC | PRN
  Administered 2012-01-30: 4 mg via INTRAVENOUS
  Filled 2012-01-30: qty 2

## 2012-01-30 MED ORDER — ACETAMINOPHEN 325 MG PO TABS
650.0000 mg | ORAL_TABLET | Freq: Four times a day (QID) | ORAL | Status: DC | PRN
  Administered 2012-01-30: 650 mg via ORAL
  Filled 2012-01-30: qty 2

## 2012-01-30 MED ORDER — LORAZEPAM 0.5 MG PO TABS
0.5000 mg | ORAL_TABLET | Freq: Four times a day (QID) | ORAL | Status: DC | PRN
  Administered 2012-01-30 – 2012-02-01 (×5): 0.5 mg via ORAL
  Filled 2012-01-30 (×5): qty 1

## 2012-01-30 MED ORDER — SODIUM CHLORIDE 0.9 % IJ SOLN
INTRAMUSCULAR | Status: AC
  Filled 2012-01-30: qty 3

## 2012-01-30 NOTE — Progress Notes (Signed)
Patient refuses to allow staff to set patients bed alarm, explained to patient the importance of getting OOB with assistance only and the hospital policy to set bed alarms to prevent patient falls. Pt demanding that bed alarm be turned off. Bed alarm turned off per patient request. Bernie Covey, RN

## 2012-01-30 NOTE — Consult Note (Signed)
NAME:  [name] [name], [name] [name]              ACCOUNT NO.:  [id number]  MEDICAL RECORD NO.:  [id number]  LOCATION:  A335                          FACILITY:  [facility]  PHYSICIAN:  Brinn Westby A. Gerilyn Pilgrim, M.D. DATE OF BIRTH:  [birth year]  DATE OF CONSULTATION: DATE OF DISCHARGE:                                CONSULTATION   HISTORY OF PRESENT ILLNESS:  The patient is a 76 year old white female, who has had a previous stroke about 11 years ago, where she presented with acute headache on the left side and dysarthria, twisting of the mouth, and facial numbness on the left side and weakness on one side, though she cannot remember if the right side or left side.  The patient had difficulty eating and talking after the stroke, required some physical therapy, she improved significantly.  I did see her in 2008 for acute vertiginous symptoms with intense nausea and vomiting.  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  Imaging was done extensively and was essentially unremarkable.  Imaging at that time included MRA of the brain, MRV because of severe headaches and also an MRI , all were again normal.  The patient presented with similar symptoms that she had previously with her initial stroke 11 years ago. She reports headaches particularly on the left side.  She does have numbness on both sides.  She has residual left perioral numbness, but on presentation also had some on the right side.  She reports having weakness on the right side.  With her current symptoms, she does not report having significant dysarthria, though she was told that she did have some slurring of the speech yesterday.  No vomiting, although she did have some mild nausea.  She reports little bit of disequilibrium while standing.  She had an MRI, which does not show an acute infarct. She is supposed to be on aspirin for secondary stroke prevention and also for coronary protection, status post unstable angina and has  been grasping; however, she reports that she has not been on aspirin recently.  She did take Aggrenox in the past, but had headaches and apparently also was on Plavix and had some intolerance issues with this medication.  PHYSICAL EXAMINATION:  GENERAL:  She is awake and alert. HEENT:  Neck is large, but supple.  Head is normocephalic, atraumatic. ABDOMEN:  Obese. EXTREMITIES:  She has scaly plaques on the extensor surface of the elbows.  Apparently, has a history of psoriasis.  Similar lesions are found on the legs associated with some blanching erythema. MENTATION:  She is awake and alert.  No dysarthria is noted today.  She is lucid and coherent.  Speech, language, and cognition are all intact. Cranial nerve evaluation, pupils are 3 mm and reactive although sluggishly.  Visual fields are intact.  Extraocular movements are intact.  No nystagmus is seen.  Facial muscle strength is symmetric. Tongue is midline.  Uvula midline.  Shoulder shrug is normal.  Motor examination shows global weakness throughout approximately 4/5.  Distal strength is normal.  Bulk and tone are also normal.  Coordination shows no dysmetria.  No tremors.  No parkinsonism.  Reflexes are preserved. Plantars are extensor on the right,  flexor on the left.  Sensation is diminished per orally on both sides and also around the cheek on the right side.  IMAGING DATA:  MRI is reviewed in person and it shows moderate confluent leukoencephalopathy, nothing acute is seen on diffusion imaging upon T1. There is a lucency seen in the crus of the mid brain on the right side suggestive of lacunar infarct versus perivascular dilated space.  Similar lesion was also seen in the thalamus on the same side.  A workup was also significant for UTI.  The patient reports that she has been having recurring frequent UTIs despite being on multiple medication and had also seen a urologist for this.  ASSESSMENT:  I believe the patient  likely has a reactivation of old stroke symptoms related to a urinary tract infection and dehydration as the most likely explanation for her symptoms at this time.  A new ischemic event such as a transient ischemic attack possible, which seems unlikely.  SUGGESTION:  She is on aspirin and Plavix combination.  She really does not need to be on both of these.  She has an MRA pending, and if this is unremarkable, I think we will stop one of these likely be Plavix and continue with aspirin.  I did tell her that it is important for her to be compliant with this.  A carotid duplex Doppler will also be obtained. We will continue with serial neurological evaluation.  Also, suggest physical therapy.  Thanks for this consultation.     Stpehanie Montroy A. Gerilyn Pilgrim, M.D.     KAD/MEDQ  D:  01/30/2012  T:  01/30/2012  Job:  [id number]

## 2012-01-30 NOTE — Progress Notes (Signed)
NAME:  [name] [name], [name] [name]              ACCOUNT NO.:  [id number]  MEDICAL RECORD NO.:  [id number]  LOCATION:  A335                          FACILITY:  [facility]  PHYSICIAN:  Shauntel Prest G. Renard Matter, MD   DATE OF BIRTH:  August 24, 1936  DATE OF PROCEDURE: DATE OF DISCHARGE:                                PROGRESS NOTE   This patient developed some neurological symptoms prior to admission, some numbness in her lips and weakness in her right arm.  She came to the emergency room, subsequently was admitted. Had a CT of the brain which did not show definite stroke.  MRI of the brain did not show definite stroke.  She is scheduled for MRA of the brain today and Neurology consult.  She is alert this morning.  OBJECTIVE:  VITAL SIGNS:  Blood pressure 126/69, respirations 12, pulse 66, temp 97.8. LUNGS:  Clear to P and A. HEART:  Regular rhythm. ABDOMEN:  No palpable organs or masses. NEUROLOGICAL:  The patient does have some slight facial weakness.  Left arm and leg weakness.  IMPRESSION:  She is felt to have suffered some type of transient ischemic attack or stroke.  She is diabetic and does have hypertension. Has chronic urinary tract infections.  PLAN:  To proceed with MRA today of brain.  Will be seen in consultation by Neurology today.     Darek Eifler G. Renard Matter, MD     AGM/MEDQ  D:  01/30/2012  T:  01/30/2012  Job:  [id number]

## 2012-01-30 NOTE — Plan of Care (Signed)
Problem: Consults Goal: Stroke - Ischemic/TIA Patient Education See Patient Education Module for education specifics. Outcome: Progressing Patient with Hx of CVA 10 yrs ago,  Well informed of risks of stroke Goal: Skin Care Protocol Initiated - if indicated If consults are not indicated, leave blank or document N/A Outcome: Progressing Psoriasis to bilat lower legs, itching.  Bilat elbows, hands and left buttock  Problem: Phase I Progression Outcomes Goal: Strict NPO til swallow screen done Outcome: Progressing Passed Swallow Screen Goal: NIH Stroke Scale-NIHSS done on admission Outcome: Completed/Met Date Met:  01/30/12 Done on admission Goal: Pain controlled with appropriate interventions Outcome: Progressing Chronic back pain and anxiety, Goal: Initial discharge plan identified Outcome: Progressing Home with Spouse

## 2012-01-30 NOTE — Progress Notes (Signed)
Received report on UA and called into Dr. Renard Matter.  Received new orders and followed.  Stated he will get with pharmacy to decide on antibiotic treatment according to culture report he has from office recently.

## 2012-01-30 NOTE — Consult Note (Signed)
Reason for Consult: Referring Physician:   [name] is an 76 y.o. female.  HPI:  Past Medical History  Diagnosis Date  . Arteriosclerotic cardiovascular disease (ASCVD)      CABG surgery in 2001; nl stress nuclear-2007; angiography in 01/2006- TO of LAD; 70% ostial stenosis      of a small ramus; patent grafts; normal EF; cath in 12/2009 essentially unchanged; EF-66%;  Congestive heart failure with preserved LV systolic function  . Hypertension   . Obstructive sleep apnea   . Hyperthyroidism   . Cerebrovascular disease     H/o CVA  . Chronic back pain   . Vestibular neuritis     ataxia  . Varicose veins   . Diabetes mellitus     Requires insulin  . Acute renal failure     Possibly contrast-induced  . Obesity   . Chronic obstructive pulmonary disease     Chronic bronchitis  . Fall 2009    right humeral fracture  . Peripheral neuropathy     Left lower extremity; possibly radiculopathy    Past Surgical History  Procedure Date  . Coronary artery bypass graft 6/01    SVG to diagonal, LIMA to LAD  . Rotator cuff repair   . Appendectomy   . Cholecystectomy   . Total abdominal hysterectomy   . Lumbar spine surgery     X2  . Knee surgery     Arthroscopic  . Carpal tunnel release     Bilateral  . Orif humerus fracture 2009    Family History  Problem Relation Age of Onset  . Coronary artery disease      Multiple first and second-degree relatives  . Aneurysm      Cerebral circulation  . Hypertension Mother   . Colon cancer Sister   . Clotting disorder      Children diagnosed with hypercoagulable state    Social History:  reports that she has never smoked. She has never used smokeless tobacco. She reports that she does not drink alcohol or use illicit drugs.  Allergies:  Allergies  Allergen Reactions  . Codeine Itching    Medications:  Prior to Admission medications   Medication Sig Start Date End Date Taking? Authorizing Provider  amLODipine (NORVASC)  5 MG tablet Take 1 tablet (5 mg total) by mouth daily. 01/07/12 01/06/13 Yes Gerrit Friends. Rothbart, MD  exenatide (BYETTA 5 MCG PEN) 5 MCG/0.02ML SOLN Inject 10 mcg into the skin 2 (two) times daily with a meal.    Yes Historical Provider, MD  HYDROcodone-acetaminophen (LORTAB) 10-500 MG per tablet Take 0.5-1 tablets by mouth Once daily as needed. For pain 12/26/11  Yes Historical Provider, MD  insulin glargine (LANTUS SOLOSTAR) 100 UNIT/ML injection Inject 60 Units into the skin at bedtime.     Yes Historical Provider, MD  insulin glulisine (APIDRA) 100 UNIT/ML injection Inject 20 Units into the skin 3 (three) times daily before meals.     Yes Historical Provider, MD  levothyroxine (SYNTHROID, LEVOTHROID) 25 MCG tablet Take 25 mcg by mouth daily.     Yes Historical Provider, MD  lisinopril-hydrochlorothiazide (PRINZIDE,ZESTORETIC) 20-12.5 MG per tablet Take 1 tablet by mouth daily. 01/07/12 01/06/13 Yes Gerrit Friends. Dietrich Pates, MD  metFORMIN (GLUCOPHAGE) 500 MG tablet Take 500 mg by mouth 2 (two) times daily with a meal.     Yes Historical Provider, MD  pravastatin (PRAVACHOL) 80 MG tablet Take 1 tablet (80 mg total) by mouth daily. 01/07/12 01/06/13 Yes Gerrit Friends. Rothbart,  MD  triamcinolone cream (KENALOG) 0.1 % Apply topically 2 (two) times daily. 01/07/12 01/06/13 Yes Gerrit Friends. Rothbart, MD  nitroGLYCERIN (NITROSTAT) 0.4 MG SL tablet Place 0.4 mg under the tongue every 5 (five) minutes as needed.      Historical Provider, MD   Scheduled Meds:   . sodium chloride   Intravenous Once  . amLODipine  5 mg Oral Daily  . aspirin EC  325 mg Oral Daily  . clopidogrel  75 mg Oral Q breakfast  . diphenhydrAMINE  50 mg Intravenous Once  . enoxaparin  40 mg Subcutaneous Daily  . lisinopril  20 mg Oral Daily   And  . hydrochlorothiazide  12.5 mg Oral Daily  . insulin aspart  0-20 Units Subcutaneous TID WC  . insulin aspart  0-5 Units Subcutaneous QHS  . insulin glargine  15 Units Subcutaneous QHS  . levothyroxine  25  mcg Oral Daily  . metoCLOPramide (REGLAN) injection  10 mg Intravenous Once  . ondansetron  4 mg Intravenous Once  . pantoprazole  40 mg Oral Q1200  . simvastatin  40 mg Oral q1800  . sodium chloride  3 mL Intravenous Q12H  . sodium chloride      . DISCONTD: lisinopril-hydrochlorothiazide  1 tablet Oral Daily   Continuous Infusions:   . sodium chloride 75 mL/hr at 01/30/12 0600   PRN Meds:.acetaminophen, acetaminophen, alum & mag hydroxide-simeth, diphenhydrAMINE, HYDROcodone-acetaminophen, LORazepam, nitroGLYCERIN, ondansetron (ZOFRAN) IV, ondansetron, DISCONTD: HYDROcodone-acetaminophen   Results for orders placed during the hospital encounter of 01/29/12 (from the past 48 hour(s))  CBC     Status: Abnormal   Collection Time   01/29/12  5:25 PM      Component Value Range Comment   WBC 11.2 (*) 4.0 - 10.5 (K/uL)    RBC 4.79  3.87 - 5.11 (MIL/uL)    Hemoglobin 13.7  12.0 - 15.0 (g/dL)    HCT 62.9  52.8 - 41.3 (%)    MCV 88.5  78.0 - 100.0 (fL)    MCH 28.6  26.0 - 34.0 (pg)    MCHC 32.3  30.0 - 36.0 (g/dL)    RDW 24.4  01.0 - 27.2 (%)    Platelets 230  150 - 400 (K/uL)   DIFFERENTIAL     Status: Normal   Collection Time   01/29/12  5:25 PM      Component Value Range Comment   Neutrophils Relative 56  43 - 77 (%)    Neutro Abs 6.2  1.7 - 7.7 (K/uL)    Lymphocytes Relative 32  12 - 46 (%)    Lymphs Abs 3.6  0.7 - 4.0 (K/uL)    Monocytes Relative 7  3 - 12 (%)    Monocytes Absolute 0.8  0.1 - 1.0 (K/uL)    Eosinophils Relative 5  0 - 5 (%)    Eosinophils Absolute 0.5  0.0 - 0.7 (K/uL)    Basophils Relative 0  0 - 1 (%)    Basophils Absolute 0.1  0.0 - 0.1 (K/uL)   COMPREHENSIVE METABOLIC PANEL     Status: Abnormal   Collection Time   01/29/12  5:25 PM      Component Value Range Comment   Sodium 136  135 - 145 (mEq/L)    Potassium 4.3  3.5 - 5.1 (mEq/L)    Chloride 100  96 - 112 (mEq/L)    CO2 28  19 - 32 (mEq/L)    Glucose, Bld 252 (*) 70 - 99 (  mg/dL)    BUN 12  6 - 23  (mg/dL)    Creatinine, Ser 1.61  0.50 - 1.10 (mg/dL)    Calcium 09.6  8.4 - 10.5 (mg/dL)    Total Protein 6.8  6.0 - 8.3 (g/dL)    Albumin 3.5  3.5 - 5.2 (g/dL)    AST 15  0 - 37 (U/L)    ALT 16  0 - 35 (U/L)    Alkaline Phosphatase 85  39 - 117 (U/L)    Total Bilirubin 0.3  0.3 - 1.2 (mg/dL)    GFR calc non Af Amer 59 (*) >90 (mL/min)    GFR calc Af Amer 68 (*) >90 (mL/min)   APTT     Status: Normal   Collection Time   01/29/12  5:25 PM      Component Value Range Comment   aPTT 33  24 - 37 (seconds)   PROTIME-INR     Status: Normal   Collection Time   01/29/12  5:25 PM      Component Value Range Comment   Prothrombin Time 13.4  11.6 - 15.2 (seconds)    INR 1.00  0.00 - 1.49    POCT I-STAT TROPONIN I     Status: Normal   Collection Time   01/29/12  5:47 PM      Component Value Range Comment   Troponin i, poc 0.00  0.00 - 0.08 (ng/mL)    Comment 3            GLUCOSE, CAPILLARY     Status: Abnormal   Collection Time   01/30/12  1:04 AM      Component Value Range Comment   Glucose-Capillary 270 (*) 70 - 99 (mg/dL)    Comment 1 Documented in Chart     BASIC METABOLIC PANEL     Status: Abnormal   Collection Time   01/30/12  5:11 AM      Component Value Range Comment   Sodium 137  135 - 145 (mEq/L)    Potassium 4.3  3.5 - 5.1 (mEq/L)    Chloride 101  96 - 112 (mEq/L)    CO2 29  19 - 32 (mEq/L)    Glucose, Bld 331 (*) 70 - 99 (mg/dL)    BUN 13  6 - 23 (mg/dL)    Creatinine, Ser 0.45  0.50 - 1.10 (mg/dL)    Calcium 9.3  8.4 - 10.5 (mg/dL)    GFR calc non Af Amer 83 (*) >90 (mL/min)    GFR calc Af Amer >90  >90 (mL/min)   CBC     Status: Normal   Collection Time   01/30/12  5:11 AM      Component Value Range Comment   WBC 9.2  4.0 - 10.5 (K/uL)    RBC 4.51  3.87 - 5.11 (MIL/uL)    Hemoglobin 12.9  12.0 - 15.0 (g/dL)    HCT 40.9  81.1 - 91.4 (%)    MCV 89.4  78.0 - 100.0 (fL)    MCH 28.6  26.0 - 34.0 (pg)    MCHC 32.0  30.0 - 36.0 (g/dL)    RDW 78.2  95.6 - 21.3 (%)    Platelets  230  150 - 400 (K/uL)   PROTIME-INR     Status: Normal   Collection Time   01/30/12  5:11 AM      Component Value Range Comment   Prothrombin Time 13.8  11.6 - 15.2 (seconds)  INR 1.04  0.00 - 1.49    GLUCOSE, CAPILLARY     Status: Abnormal   Collection Time   01/30/12  7:17 AM      Component Value Range Comment   Glucose-Capillary 248 (*) 70 - 99 (mg/dL)    Comment 1 Notify RN     URINALYSIS, ROUTINE W REFLEX MICROSCOPIC     Status: Abnormal   Collection Time   01/30/12  7:38 AM      Component Value Range Comment   Color, Urine YELLOW  YELLOW     APPearance CLEAR  CLEAR     Specific Gravity, Urine 1.025  1.005 - 1.030     pH 5.5  5.0 - 8.0     Glucose, UA >1000 (*) NEGATIVE (mg/dL)    Hgb urine dipstick NEGATIVE  NEGATIVE     Bilirubin Urine NEGATIVE  NEGATIVE     Ketones, ur NEGATIVE  NEGATIVE (mg/dL)    Protein, ur NEGATIVE  NEGATIVE (mg/dL)    Urobilinogen, UA 0.2  0.0 - 1.0 (mg/dL)    Nitrite POSITIVE (*) NEGATIVE     Leukocytes, UA NEGATIVE  NEGATIVE    URINE MICROSCOPIC-ADD ON     Status: Abnormal   Collection Time   01/30/12  7:38 AM      Component Value Range Comment   Squamous Epithelial / LPF FEW (*) RARE     WBC, UA 11-20  <3 (WBC/hpf)    RBC / HPF 0-2  <3 (RBC/hpf)    Bacteria, UA MANY (*) RARE      Ct Head Wo Contrast  01/29/2012  *RADIOLOGY REPORT*  Clinical Data: Headache and weakness.  CT HEAD WITHOUT CONTRAST  Technique:  Contiguous axial images were obtained from the base of the skull through the vertex without contrast.  Comparison: None.  Findings: Small vessel ischemic changes are present in the periventricular white matter. The brain demonstrates no evidence of hemorrhage, infarction, edema, mass effect, extra-axial fluid collection, hydrocephalus or mass lesion.  The skull is unremarkable.  IMPRESSION: No acute findings.  Underlying small vessel disease.  Original Report Authenticated By: Reola Calkins, M.D.   [name] Contrast  01/29/2012  *RADIOLOGY  REPORT*  Clinical Data: Right facial numbness.  Left lower extremity weakness.  MRI HEAD WITHOUT CONTRAST  Technique:  Multiplanar, multiecho pulse sequences of the brain and surrounding structures were obtained according to standard protocol without intravenous contrast.  Comparison: Head CT same day.  MRI 09/21/2007.  Findings: Diffusion imaging does not show any acute or subacute infarction.  The brain shows generalized atrophy with extensive chronic small vessel disease affecting the hemispheric white matter, the basal ganglia and thalami and brainstem.  No large vessel territory infarction is seen.  No mass lesion, hemorrhage, hydrocephalus or extra-axial collection.  No pituitary mass.  No advanced inflammatory sinus disease.  No skull or skull base lesion.  IMPRESSION: Atrophy and chronic small vessel disease, progressive since 2008. No evidence of acute or subacute lesion.  Original Report Authenticated By: Thomasenia Sales, M.D.    Review of Systems  Constitutional: Negative.   Eyes: Negative.   Respiratory: Negative.   Cardiovascular: Negative.   Gastrointestinal: Negative.   Musculoskeletal: Negative.   Skin: Negative.   Neurological: Positive for tingling, sensory change, speech change, focal weakness and headaches. Negative for seizures and loss of consciousness.  Endo/Heme/Allergies: Negative.   Psychiatric/Behavioral: Negative.    Blood pressure 126/69, pulse 66, temperature 97.8 F (36.6 C), temperature source Oral,  resp. rate 12, height 5\' 4"  (1.626 m), weight 95.4 kg (210 lb 5.1 oz), SpO2 92.00%. Physical Exam  Assessment/Plan: See dictation  Yashar Inclan 01/30/2012, 8:34 AM

## 2012-01-31 LAB — GLUCOSE, CAPILLARY
Glucose-Capillary: 268 mg/dL — ABNORMAL HIGH (ref 70–99)
Glucose-Capillary: 213 mg/dL — ABNORMAL HIGH (ref 70–99)
Glucose-Capillary: 224 mg/dL — ABNORMAL HIGH (ref 70–99)

## 2012-01-31 MED ORDER — ALBUTEROL SULFATE (5 MG/ML) 0.5% IN NEBU
INHALATION_SOLUTION | RESPIRATORY_TRACT | Status: AC
  Administered 2012-01-31: 2.5 mg
  Filled 2012-01-31: qty 0.5

## 2012-01-31 MED ORDER — ALBUTEROL SULFATE (5 MG/ML) 0.5% IN NEBU
2.5000 mg | INHALATION_SOLUTION | RESPIRATORY_TRACT | Status: DC | PRN
  Administered 2012-02-01 (×2): 2.5 mg via RESPIRATORY_TRACT
  Filled 2012-01-31 (×2): qty 0.5

## 2012-01-31 NOTE — Progress Notes (Signed)
CARE MANAGEMENT NOTE 01/31/2012  Patient:  [name] [name], [name] [name]   Account Number:  [id number]  Date Initiated:  01/31/2012  Documentation initiated by:  Anibal Henderson  Subjective/Objective Assessment:   admitted with weakness, R/O CVA. pt is from home, lives with spouse and plans to return home     Action/Plan:   may need HH at D/C- PT recommends HH/PT   Anticipated DC Date:  02/01/2012   Anticipated DC Plan:  HOME W HOME HEALTH SERVICES      DC Planning Services  CM consult      [facility] Choice  HOME HEALTH   Choice offered to / List presented to:  C-1 Patient        HH arranged  HH-1 RN  HH-2 PT      [facility] agency  [facility]   Status of service:  In process, will continue to follow Medicare Important Message given?   (If response is "NO", the following Medicare IM given date fields will be blank) Date Medicare IM given:   Date Additional Medicare IM given:    Discharge Disposition:    Per UR Regulation:    Comments:  01/31/12 1000 Anibal Henderson RN

## 2012-01-31 NOTE — Evaluation (Signed)
Physical Therapy Evaluation Patient Details Name: [name] [name] MRN: [id number] DOB: 1936/01/10 Today's Date: 01/31/2012  Problem List:  Patient Active Problem List  Diagnoses  . HYPERTHYROIDISM  . DIABETES MELLITUS, TYPE II  . HYPERLIPIDEMIA  . OBESITY  . HYPERTENSION  . Arteriosclerotic cardiovascular disease (ASCVD)  . SLEEP APNEA  . Cerebrovascular disease  . Chronic back pain  . Chronic obstructive pulmonary disease  . Psoriasis    Past Medical History:  Past Medical History  Diagnosis Date  . Arteriosclerotic cardiovascular disease (ASCVD)      CABG surgery in 2001; nl stress nuclear-2007; angiography in 01/2006- TO of LAD; 70% ostial stenosis      of a small ramus; patent grafts; normal EF; cath in 12/2009 essentially unchanged; EF-66%;  Congestive heart failure with preserved LV systolic function  . Hypertension   . Obstructive sleep apnea   . Hyperthyroidism   . Cerebrovascular disease     H/o CVA  . Chronic back pain   . Vestibular neuritis     ataxia  . Varicose veins   . Diabetes mellitus     Requires insulin  . Acute renal failure     Possibly contrast-induced  . Obesity   . Chronic obstructive pulmonary disease     Chronic bronchitis  . Fall 2009    right humeral fracture  . Peripheral neuropathy     Left lower extremity; possibly radiculopathy   Past Surgical History:  Past Surgical History  Procedure Date  . Coronary artery bypass graft 6/01    SVG to diagonal, LIMA to LAD  . Rotator cuff repair   . Appendectomy   . Cholecystectomy   . Total abdominal hysterectomy   . Lumbar spine surgery     X2  . Knee surgery     Arthroscopic  . Carpal tunnel release     Bilateral  . Orif humerus fracture 2009    PT Assessment/Plan/Recommendation PT Assessment Clinical Impression Statement: pt is very drowsy at time of eval...falls asleep intermittently, so unable to do a thorough eval...her strength appears to be WNL  as do all patterns of  motion...it was not safe to attempt any significant walking due to drowsiness, but fro the little bit I was able to observe, I do not anticipate that she will have any abnormality PT Recommendation/Assessment: Patient will need skilled PT in the acute care venue PT Problem List: Decreased activity tolerance;Decreased mobility Problem List Comments: it would be prudent to continue to see pt to ensure gait stability...if she is d/c'd over the weekend, would recommend HHPT eval Barriers to Discharge: None PT Therapy Diagnosis : Difficulty walking (probably due to drowsiness) PT Plan PT Frequency: Min 2X/week PT Treatment/Interventions: Gait training PT Recommendation Follow Up Recommendations: Home health PT Equipment Recommended: None recommended by PT PT Goals  Acute Rehab PT Goals PT Goal Formulation: With family Time For Goal Achievement: 4 days Pt will Ambulate: 16 - 50 feet;with modified independence;with least restrictive assistive device PT Goal: Ambulate - Progress: Goal set today  PT Evaluation Precautions/Restrictions  Precautions Precautions: Fall Precaution Comments: due to extreme drowsiness Required Braces or Orthoses: No Restrictions Weight Bearing Restrictions: No Prior Functioning  Home Living Lives With: Spouse Receives Help From: Family Type of Home: House Home Layout: One level Home Access: Stairs to enter Entrance Stairs-Rails: None Entrance Stairs-Number of Steps: 1 Home Adaptive Equipment: Bedside commode/3-in-1;Shower chair with back;Walker - rolling;Straight cane Prior Function Level of Independence: Independent with basic ADLs;Independent with homemaking with  ambulation;Independent with gait;Independent with transfers Driving: Yes 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, [employer] (extremely drowsy-frequently falls asleep) Overall Cognitive Status: Appears within functional limits for tasks assessed Orientation Level: Oriented  X4 Sensation/Coordination Sensation Light Touch: Appears Intact Stereognosis: Not tested Hot/Cold: Not tested Proprioception: Appears Intact Coordination Gross Motor Movements are Fluid and Coordinated: Yes Extremity Assessment RUE Assessment RUE Assessment: Within Functional Limits LUE Assessment LUE Assessment: Within Functional Limits RLE Assessment RLE Assessment: Within Functional Limits LLE Assessment LLE Assessment: Within Functional Limits Mobility (including Balance) Bed Mobility Bed Mobility: Yes Supine to Sit: 6: Modified independent (Device/Increase time) Sit to Supine: 6: Modified independent (Device/Increase time) Transfers Transfers: Yes Stand Pivot Transfers: 6: Modified independent (Device/Increase time) Ambulation/Gait Ambulation/Gait: Yes Ambulation/Gait Assistance: 5: Supervision Ambulation/Gait Assistance Details (indicate cue type and reason): too drowsy for safe gait...the few steps that she took were stable but she needed to be monitored closely due to drowsiness Ambulation Distance (Feet): 3 Feet Assistive device: None Gait Pattern: Within Functional Limits Stairs: No Wheelchair Mobility Wheelchair Mobility: No  Posture/Postural Control Posture/Postural Control: No significant limitations Balance Balance Assessed: No (too drowsy) Exercise    End of Session PT - End of Session Equipment Utilized During Treatment: Gait belt Activity Tolerance: Patient limited by fatigue Patient left: in bed;with call bell in reach;with family/visitor present General Behavior During Session: [facility] for tasks performed Cognition: [facility] for tasks performed  Konrad Penta 01/31/2012, 11:02 AM

## 2012-01-31 NOTE — Progress Notes (Signed)
NAME:  [name] [name], [name] [name]              ACCOUNT NO.:  [id number]  MEDICAL RECORD NO.:  [id number]  LOCATION:  A335                          FACILITY:  [facility]  PHYSICIAN:  Arne Schlender A. Gerilyn Pilgrim, M.D. DATE OF BIRTH:  [birth year]  DATE OF PROCEDURE: DATE OF DISCHARGE:                                PROGRESS NOTE   She is dozing in the exam room and has a hard time keeping her eyes opened.  She is snoring quite significantly.  She reports feeling fatigued and drowsy.  PHYSICAL EXAMINATION:  HEENT:  Neck is supple.  Head is normocephalic, atraumatic.  She has a large neck and small posterior airspace.  The patient does report having history of sleep apnea at baseline, diagnosed a few years ago, although she is not using her machine.  She does not know what her pressures are. NEUROLOGIC:  Cranial nerve evaluation, pupils are reactive.  Extraocular movements are full.  Visual fields are intact.  Face is symmetric. Tongue is midline.  Motor examination, she continues to have weakness proximally in upper extremities and lower extremity.  Upper extremity is about 4-, leg 4+.  She has good distal strength.  Bulk and tone are normal.  No pronator drift.  Coordination is normal finger-to-nose.  No dysmetria.  Carotid duplex Doppler shows about 50% stenosis of the left ICA.  MRA shows some moderate intracranial disease, but vertebrobasilar systems are widely patent and the ICAs were also widely patent.  Nothing there really concerns me at this time.  ASSESSMENT AND PLAN: 1. Recurrent symptoms of weakness right side with periorbital     numbness.  Again, I suspect this is recurrence of old stroke in the     setting of urinary tract infection.  Her workup is negative for any     acute stroke.  She does not have significant intracranial occlusive     disease that warrants intervention.  She should continue with     antiplatelet agents.  Again, she was not taking her aspirin.  I     think it is sufficient  to restart aspirin without the need for     aspirin and Plavix combination. 2. Marked drowsiness daytime and encephalopathy.  I think this is     likely due to untreated sleep apnea.  We will go ahead and treat     her while she is in the hospital.  I did advise her to restart the     machine at home on the breakfast, which are already actually back     normal including a normal TSH of 0.9, homocysteine level 8, and     vitamin B12 level 346.  I will also suggest physical therapy.     [name] A. Gerilyn Pilgrim, M.D.    KAD/MEDQ  D:  01/31/2012  T:  01/31/2012  Job:  [id number]

## 2012-01-31 NOTE — Progress Notes (Signed)
Inpatient Diabetes Program Recommendations  AACE/ADA: New Consensus Statement on Inpatient Glycemic Control (2009)  Target Ranges:  Prepandial:   less than 140 mg/dL      Peak postprandial:   less than 180 mg/dL (1-2 hours)      Critically ill patients:  140 - 180 mg/dL   Reason for Visit: Elevated glucose  Results for [name] [name], [name] [name] (MRN [id number]) as of 01/31/2012 11:30  Ref. Range 01/30/2012 11:27 01/30/2012 16:30 01/30/2012 21:27 01/31/2012 07:57  Glucose-Capillary Latest Range: 70-99 mg/dL 119 (H) 147 (H) 829 (H) 232 (H)     Inpatient Diabetes Program Recommendations Insulin - Basal: Increase Lantus to home dose of 60 units qhs Insulin - Meal Coverage: Add Novolog 6 units TID for meal coverage

## 2012-01-31 NOTE — Progress Notes (Signed)
Subjective: Interval History:   Objective: Vital signs in last 24 hours: Temp:  [97.9 F (36.6 C)-100.2 F (37.9 C)] 100.2 F (37.9 C) (02/08 0537) Pulse Rate:  [61-88] 88  (02/08 0537) Resp:  [18-20] 20  (02/08 0537) BP: (113-151)/(60-78) 150/75 mmHg (02/08 0537) SpO2:  [89 %-98 %] 90 % (02/08 0537) Weight:  [96.7 kg (213 lb 3 oz)] 96.7 kg (213 lb 3 oz) (02/08 0553)  Intake/Output from previous day: 02/07 0701 - 02/08 0700 In: 1556 [P.O.:240; I.V.:1266; IV Piggyback:50] Out: 1475 [Urine:1475] Intake/Output this shift:   Nutritional status: Carb Control   Lab Results:  Basename 01/30/12 0511 01/29/12 1725  WBC 9.2 11.2*  HGB 12.9 13.7  HCT 40.3 42.4  PLT 230 230  NA 137 136  K 4.3 4.3  CL 101 100  CO2 29 28  GLUCOSE 331* 252*  BUN 13 12  CREATININE 0.72 0.94  CALCIUM 9.3 10.0  LABA1C -- --   Lipid Panel No results found for this basename: CHOL,TRIG,HDL,CHOLHDL,VLDL,LDLCALC in the last 72 hours  Studies/Results: Ct Head Wo Contrast  01/29/2012  *RADIOLOGY REPORT*  Clinical Data: Headache and weakness.  CT HEAD WITHOUT CONTRAST  Technique:  Contiguous axial images were obtained from the base of the skull through the vertex without contrast.  Comparison: None.  Findings: Small vessel ischemic changes are present in the periventricular white matter. The brain demonstrates no evidence of hemorrhage, infarction, edema, mass effect, extra-axial fluid collection, hydrocephalus or mass lesion.  The skull is unremarkable.  IMPRESSION: No acute findings.  Underlying small vessel disease.  Original Report Authenticated By: Reola Calkins, M.D.   Mr [name] [name] Head Wo Contrast  01/30/2012  *RADIOLOGY REPORT*  Clinical Data: Dizziness and weakness.  MRA HEAD WITHOUT CONTRAST  Technique: Angiographic images of the Circle of Willis were obtained using MRA technique without intravenous contrast.  Comparison: MRI 01/29/2012  Findings: Image quality degraded by mild motion.  Both vertebral  arteries are patent to the basilar.  The basilar is widely patent.  Fetal origin of the right posterior cerebral artery with hypoplastic right P1 segment.  Both posterior cerebral arteries are patent.  Cerebellar artery is not optimally visualized due to motion.  Internal carotid artery is patent bilaterally.  Mild to moderate stenosis in the cavernous carotid bilaterally due to atherosclerotic irregularity.  Severe stenosis proximal right A1 segment.  Right anterior cerebral artery is patent.  Atherosclerotic disease in the right M1 segment with mild stenosis.  Moderate to severe stenosis involving the right middle cerebral artery bifurcation.  The left anterior cerebral artery is patent without stenosis.  Mild atherosclerotic disease in the left middle cerebral artery branches.  Negative for cerebral aneurysm.  IMPRESSION: Intracranial atherosclerotic disease involving the cavernous carotid, and right anterior and middle cerebral arteries.  No large vessel occlusion.  Original Report Authenticated By: Camelia Phenes, M.D.   [name] Contrast  01/29/2012  *RADIOLOGY REPORT*  Clinical Data: Right facial numbness.  Left lower extremity weakness.  MRI HEAD WITHOUT CONTRAST  Technique:  Multiplanar, multiecho pulse sequences of the brain and surrounding structures were obtained according to standard protocol without intravenous contrast.  Comparison: Head CT same day.  MRI 09/21/2007.  Findings: Diffusion imaging does not show any acute or subacute infarction.  The brain shows generalized atrophy with extensive chronic small vessel disease affecting the hemispheric white matter, the basal ganglia and thalami and brainstem.  No large vessel territory infarction is seen.  No mass lesion, hemorrhage, hydrocephalus or  extra-axial collection.  No pituitary mass.  No advanced inflammatory sinus disease.  No skull or skull base lesion.  IMPRESSION: Atrophy and chronic small vessel disease, progressive since 2008. No  evidence of acute or subacute lesion.  Original Report Authenticated By: Thomasenia Sales, M.D.   US Carotid Duplex Bilateral  01/30/2012  *RADIOLOGY REPORT*  Clinical Data: Right-sided weakness, hypertension, diabetes, CAD (prior CABG)  BILATERAL CAROTID DUPLEX ULTRASOUND  Technique: Gray scale imaging, color Doppler and duplex ultrasound was performed of bilateral carotid and vertebral arteries in the neck.  Comparison:  Brain MRI - 01/29/2012; brain MRA - 01/30/2012  Criteria:  Quantification of carotid stenosis is based on velocity parameters that correlate the residual internal carotid diameter with NASCET-based stenosis levels, using the diameter of the distal internal carotid lumen as the denominator for stenosis measurement.  The following velocity measurements were obtained:                   PEAK SYSTOLIC/END DIASTOLIC RIGHT ICA:                        102/16cm/sec CCA:                        116/14cm/sec SYSTOLIC ICA/CCA RATIO:     0.88 DIASTOLIC ICA/CCA RATIO:    1.19 ECA:                        156cm/sec  LEFT ICA:                        125/41cm/sec CCA:                        96/22cm/sec SYSTOLIC ICA/CCA RATIO:     1.3 DIASTOLIC ICA/CCA RATIO:    1.86 ECA:                        116cm/sec  Findings:  RIGHT CAROTID ARTERY: Minimal amount of eccentric atherosclerotic plaque within the right carotid bulb, not resulting in elevated peak systolic velocities to suggest a hemodynamically significant stenosis.  RIGHT VERTEBRAL ARTERY:  Preserved antegrade flow.  LEFT CAROTID ARTERY: There is a moderate amount of eccentric primarily calcified atherosclerotic plaque within the carotid bulb which results in borderline elevated peak systolic velocities of the mid and distal aspect of the left internal carotid artery (measuring 125 and 110 cm/sec respectively).  LEFT VERTEBRAL ARTERY:  Preserved antegrade flow.  IMPRESSION: 1.  Moderate amount of predominantly eccentric calcified atherosclerotic plaque within the  left carotid bulb results in borderline elevated peak systolic velocities within the left internal carotid artery likely approaching the lower end of 50-69% luminal narrowing range. 2.  Minimal amount of atherosclerotic plaque within the right carotid bulb, not resulting in hemodynamically significant stenosis.  Original Report Authenticated By: Waynard Reeds, M.D.    Medications:  Scheduled Meds:   . amLODipine  5 mg Oral Daily  . aspirin EC  325 mg Oral Daily  . cefTRIAXone (ROCEPHIN)  IV  1 g Intravenous Q24H  . clopidogrel  75 mg Oral Q breakfast  . enoxaparin  40 mg Subcutaneous Daily  . lisinopril  20 mg Oral Daily   And  . hydrochlorothiazide  12.5 mg Oral Daily  . insulin aspart  0-20 Units Subcutaneous TID WC  . insulin aspart  0-5 Units Subcutaneous QHS  . insulin glargine  15 Units Subcutaneous QHS  . levothyroxine  25 mcg Oral Daily  . pantoprazole  40 mg Oral Q1200  . phenazopyridine  100 mg Oral QID  . rosuvastatin  10 mg Oral q1800  . sodium chloride  3 mL Intravenous Q12H  . sodium chloride      . DISCONTD: simvastatin  40 mg Oral q1800   Continuous Infusions:   . sodium chloride 75 mL/hr at 01/31/12 0258   PRN Meds:.acetaminophen, acetaminophen, alum & mag hydroxide-simeth, diphenhydrAMINE, HYDROcodone-acetaminophen, LORazepam, nitroGLYCERIN, ondansetron (ZOFRAN) IV, ondansetron   Assessment/Plan: See dictation   LOS: 2 days   Teryn Gust

## 2012-02-01 LAB — URINE MICROSCOPIC-ADD ON

## 2012-02-01 LAB — URINALYSIS, ROUTINE W REFLEX MICROSCOPIC
Glucose, UA: >1000 mg/dL — AB
Hgb urine dipstick: NEGATIVE
Ketones, ur: NEGATIVE mg/dL
Leukocytes, UA: NEGATIVE
pH: 6 (ref 5.0–8.0)

## 2012-02-01 LAB — GLUCOSE, CAPILLARY: Glucose-Capillary: 248 mg/dL — ABNORMAL HIGH (ref 70–99)

## 2012-02-01 MED ORDER — INSULIN GLARGINE 100 UNIT/ML ~~LOC~~ SOLN
20.0000 [IU] | Freq: Every day | SUBCUTANEOUS | Status: DC
  Administered 2012-02-01: 20 [IU] via SUBCUTANEOUS

## 2012-02-01 MED ORDER — POLYETHYLENE GLYCOL 3350 17 G PO PACK
17.0000 g | PACK | Freq: Every day | ORAL | Status: DC | PRN
  Administered 2012-02-01: 17 g via ORAL
  Filled 2012-02-01: qty 1

## 2012-02-01 NOTE — Progress Notes (Signed)
NAME:  [name] [name], [name] [name]              ACCOUNT NO.:  [id number]  MEDICAL RECORD NO.:  [id number]  LOCATION:  A335                          FACILITY:  [facility]  PHYSICIAN:  Shelisa Fern G. Renard Matter, MD   DATE OF BIRTH:  1936/07/31  DATE OF PROCEDURE: DATE OF DISCHARGE:                                PROGRESS NOTE   This patient remains extremely drowsy.  She was admitted with CVA versus TIA.  She does have diabetes.  She has had up-to-date completion of imaging studies of the brain.  MR of the brain showed atrophy and chronic small vessel disease, progressive since 2008.  No evidence of acute or subacute lesion MRA of head without contrast showed intracranial atherosclerotic disease involving cavernous carotid, right anterior middle cerebral arteries, no large vessel occlusion.  Carotid Doppler ultrasound, moderate amount of calcified atherosclerotic plaque within the left carotid bulb, left internal artery approaching lower end of 50-69% luminal narrowing range, minimum amount of plaque in the right carotid bulb.  OBJECTIVE:  VITAL SIGNS:  Blood pressure 150/75, respirations 20, pulse 88, temp 100.2. LUNGS:  Diminished breath sounds. HEART:  Regular rhythm. ABDOMEN:  No palpable organs or masses. NEUROLOGICAL:  The patient is extremely drowsy.  She does have slight weakness of face, right side.  ASSESSMENT:  Possible recurrence of old stroke, urinary tract infection secondary to E. Coli., drowsiness, and evidence of encephalopathy, possible sleep apnea.  PLAN:  To continue IV Rocephin, continue supportive treatment.  The patient possibly could be discharged over the weekend if symptoms improve, we will put up to get her up some in the chair, continue to monitor blood sugars.     Nosson Wender G. Renard Matter, MD     AGM/MEDQ  D:  01/31/2012  T:  02/01/2012  Job:  [id number]

## 2012-02-01 NOTE — Progress Notes (Signed)
Pt refused Cpap tonight.

## 2012-02-01 NOTE — Progress Notes (Signed)
NAME:  [name] [name], [name] [name]              ACCOUNT NO.:  [id number]  MEDICAL RECORD NO.:  [id number]  LOCATION:  A335                          FACILITY:  [facility]  PHYSICIAN:  Jacquelene Kopecky G. Renard Matter, MD   DATE OF BIRTH:  05/31/1936  DATE OF PROCEDURE: DATE OF DISCHARGE:                                PROGRESS NOTE   This patient seems to be feeling better today.  She is sitting up in chair and is alert.  She was admitted with CVA versus TIA.  She does have diabetes.  She does have urinary tract infection secondary to E. coli.  She does have left cortical atrophy and chronic small vessel disease.  No large vessel occlusion, thought possibly to have sleep apnea.  OBJECTIVE:  VITAL SIGNS:  Blood pressure 99/63, respirations 18, pulse 63, temperature 97.5. LUNGS:  Clear to P and A. HEART:  Regular rhythm. ABDOMEN:  No palpable organs or masses. NEUROLOGIC:  Stable with no motor or sensory weakness in the extremities.  MOST RECENT LABORATORY DATA:  WBC 14800 with hemoglobin 10.7, hematocrit 34.3, blood.  ASSESSMENT:  The patient does have the following problems.  She is felt to have had a possible transient ischemic attack or early stroke.  She does have urinary tract infection secondary to Escherichia coli.  She has diabetes mellitus with elevated blood sugars.  She is on sliding scale.  PLAN:  Continue IV Rocephin.  We will repeat UA.  Continue to monitor glucose.     [name], MD     AGM/MEDQ  D:  02/01/2012  T:  02/01/2012  Job:  [id number]

## 2012-02-02 MED ORDER — ASPIRIN 325 MG PO TBEC: 325.0000 mg | DELAYED_RELEASE_TABLET | Freq: Every day | ORAL | Status: AC

## 2012-02-02 NOTE — Discharge Summary (Signed)
NAME:  [name] [name], [name] [name]              ACCOUNT NO.:  [id number]  MEDICAL RECORD NO.:  [id number]  LOCATION:  A335                          FACILITY:  [facility]  PHYSICIAN:  Kelsen Celona G. Renard Matter, MD   DATE OF BIRTH:  02-Aug-1936  DATE OF ADMISSION:  01/29/2012 DATE OF DISCHARGE:  02/10/2013LH                              DISCHARGE SUMMARY   ADDENDUM  Two additional medications were recommended.  Norvasc 5 mg daily, aspirin 325 mg daily.     Cayman Kielbasa G. Renard Matter, MD     AGM/MEDQ  D:  02/02/2012  T:  02/02/2012  Job:  [id number]

## 2012-02-02 NOTE — Progress Notes (Signed)
Discharge instructions given to patient. Pt verbalized understanding of discharge instructions.  No scripts from MD. Awaiting ride to transfer home.

## 2012-02-02 NOTE — Discharge Summary (Signed)
NAME:  [name] [name], [name] [name]              ACCOUNT NO.:  [id number]  MEDICAL RECORD NO.:  [id number]  LOCATION:  A335                          FACILITY:  [facility]  PHYSICIAN:  Aston Lieske G. Renard Matter, MD   DATE OF BIRTH:  October 04, 1936  DATE OF ADMISSION:  01/29/2012 DATE OF DISCHARGE:  02/10/2013LH                              DISCHARGE SUMMARY   This is a 76 year old white female admitted January 29, 2012, discharged February 02, 2012 for 4 days hospitalization.  DIAGNOSES:  Recurrent stroke, encephalopathy, cortical atrophy, urinary tract infection secondary to E. coli, sleep apnea, diabetes mellitus type 2 with hyperglycemia.  Condition stable at the time of her discharge.  This 76 year old patient began having symptoms about 4 days prior to admission.  She felt a pump in her head and numbness in her lips.  Had another episode on day of admission.  She did have a previous stroke with similar symptoms.  She was seen in the emergency room.  A CT of the brain did not show definite stroke and MRI of the brain did not show definite stroke.  It was felt she possibly could have had a brain stem stroke.  At the time of admission, was brought in the hospital, scheduled for MRA, and apparently had stopped her aspirin several days prior to this.  PHYSICAL EXAMINATION:  VITAL SIGNS:  On admission, alert patient with blood pressure 133/32, pulse 72, temp 98. HEENT:  Eyes:  PERRLA.  TMs negative.  Oropharynx benign. NECK:  Supple.  No JVD or thyroid abnormalities.  No carotid bruit. HEART:  Regular rhythm.  No murmurs. LUNGS:  Clear to P and A. ABDOMEN:  Soft, obese.  No palpable organs, no organomegaly. EXTREMITIES:  Free of edema. NEUROLOGICAL:  Cranial nerves intact.  Does have decreased vision in left eye.  No motor or sensory abnormalities.  Equal reflexes.  LAB DATA:  CBC on admission, WBC 9200, hemoglobin 12.9, hematocrit 40.3, prothrombin time 13.8, INR 1.04.  Hemoglobin A1c 8.3.  TSH 0.972,  free T4 1.05.  UA 11-20 WBCs, positive nitrites.  X-rays, CT of the head on admission, small vessel ischemic changes.  Brain demonstrates no evidence of hemorrhage, infarction, or edema, etc.  MRI of the head without contrast, atrophy and chronic small vessel disease, progressive since 2008.  No evidence of acute or subacute lesion.  MRA of the head, intracranial atherosclerotic disease involving cavernous carotid, right anterior and middle cerebral arteries.  No large vessel occlusion. Bilateral carotid duplex ultrasound, moderate amount of calcified atherosclerotic plaques within the left carotid bulb, approximately 50- 69% luminal narrowing range, minimal amount of atherosclerotic plaque in the right carotid bulb.  HOSPITAL COURSE:  The patient on admission was placed on IV normal saline, nasal O2, and was continued on the following medications. Amlodipine 5 mg daily, aspirin 325 mg daily, enoxaparin 40 mg daily, hydrochlorothiazide 12.5 mg daily, insulin aspart t.i.d 11 units and 3 units at bedtime.  The patient was subsequently placed on insulin glargine 20 units daily.  She was continued on levothyroxine 25 mcg daily, Prinivil 20 mg daily, Protonix 40 mg daily, and Crestor 10 mg daily.  The patient slowly improved during her hospital stay.  She did have evidence of urinary tract infection with dysuria.  She was placed on IV Rocephin for this 1 g daily.  The urine did improve.  Towards the latter part of hospital stay, she progressively improved.  During the hospital stay, she was quite drowsy in the early stages of her hospitalization.  She was seen in consultation by Neurology, Dr. Gerilyn Pilgrim and he felt that she had additional stroke.  The patient did have studies, CT of the head, MRI and MRA of the head.  There was some suggestion of lucency seen in the mid brain on the right suggestive of lacunar infarct.  Dr. Gerilyn Pilgrim suggested aspirin and Plavix combination. Towards the latter  days of hospitalization, the patient was able to be up in a chair, more alert.  It was felt she could be discharged and followed up as an outpatient.  She was discharged on Lantus insulin 20 units daily, Synthroid 25 mcg daily, Zestril 20 mg daily, hydrochlorothiazide 12.5 mg daily, Protonix 40 mg daily, Pyridium 100 mg daily, Cipro 500 mg b.i.d., Crestor 10 mg daily, hydrocodone/acetaminophen 10/325 every 4 hours p.r.n. for pain.     Irem Stoneham G. Renard Matter, MD     AGM/MEDQ  D:  02/02/2012  T:  02/02/2012  Job:  [id number]

## 2012-02-06 NOTE — Progress Notes (Signed)
UR Chart Review Completed  

## 2012-03-04 ENCOUNTER — Other Ambulatory Visit (HOSPITAL_COMMUNITY): Payer: Self-pay | Admitting: Family Medicine

## 2012-03-04 DIAGNOSIS — Z139 Encounter for screening, unspecified: Secondary | ICD-10-CM

## 2012-03-09 ENCOUNTER — Ambulatory Visit (HOSPITAL_COMMUNITY): Payer: Medicare Other

## 2012-05-20 IMAGING — CR DG CHEST 2V
2 series · 2 of 2 positions shown · non-contrast
Comparison: 01/19/2010

CLINICAL DATA: Cough.  COPD.  Diabetes.  Hypertension.

CHEST - 2 VIEW

[view not recorded (1 of 2)]
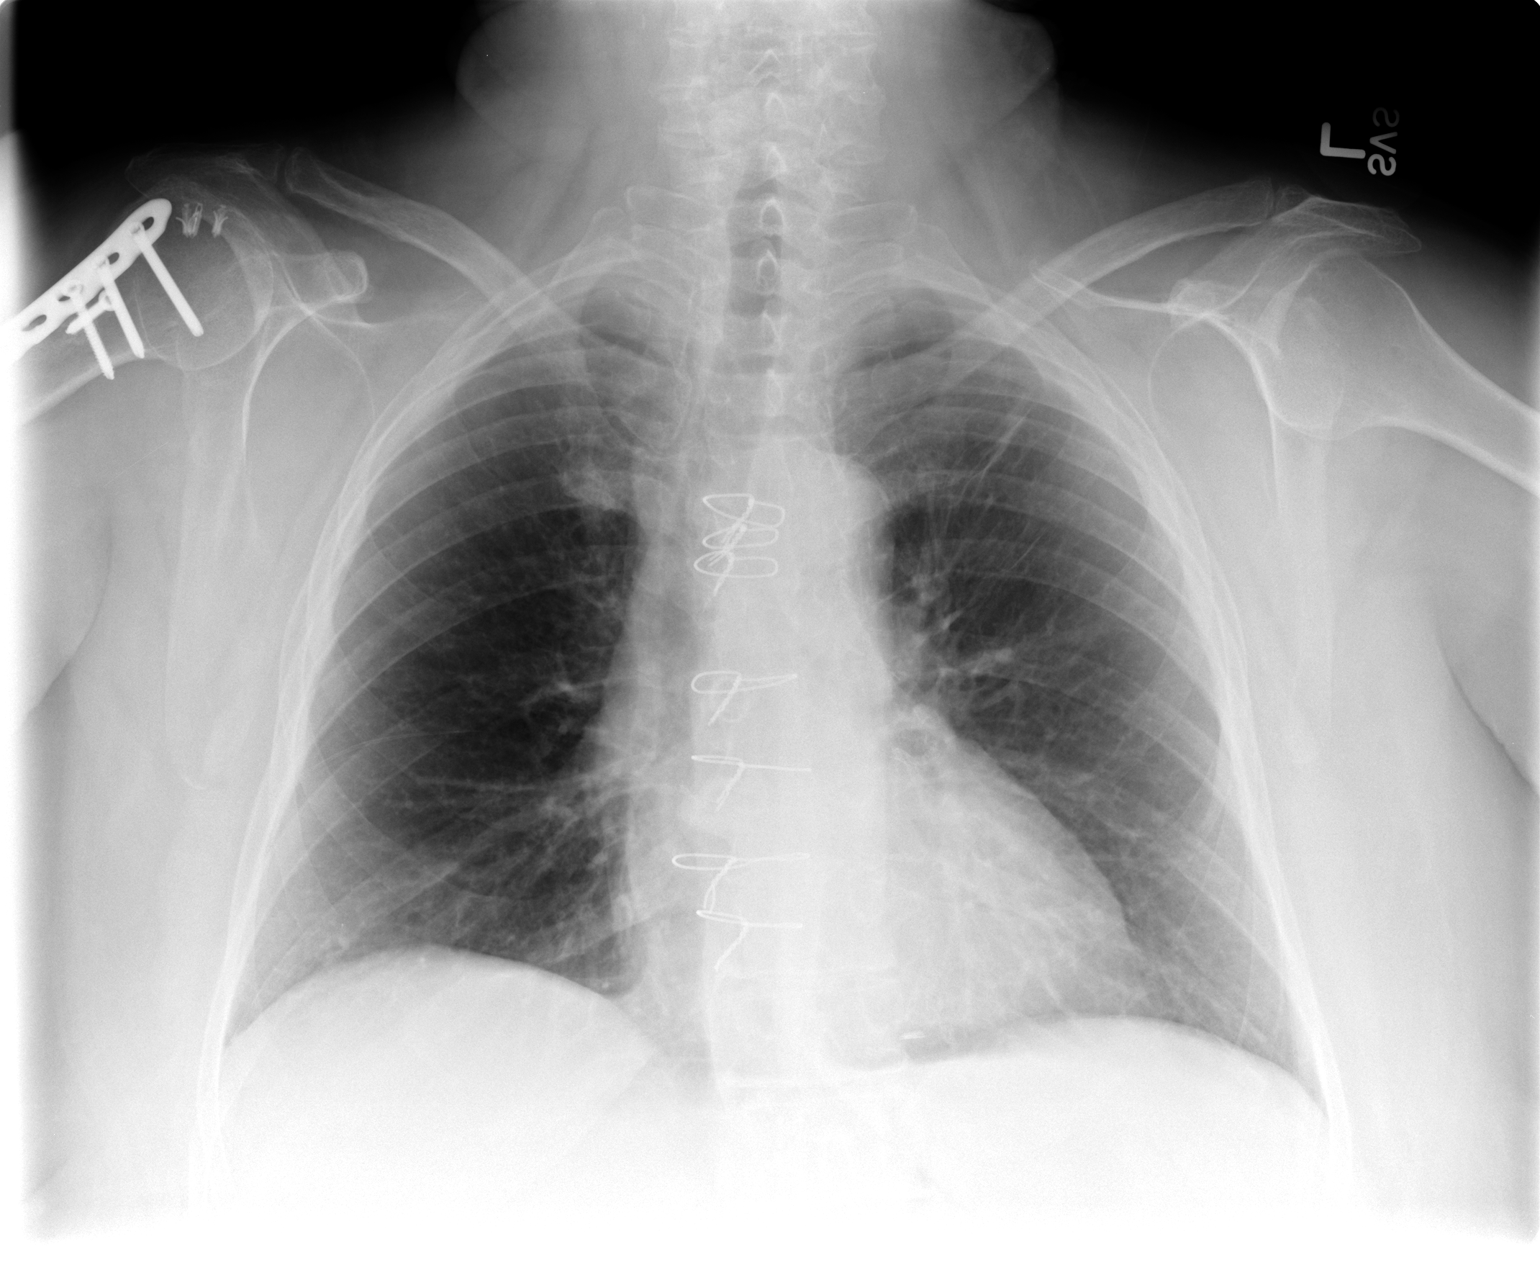

[view not recorded (2 of 2)]
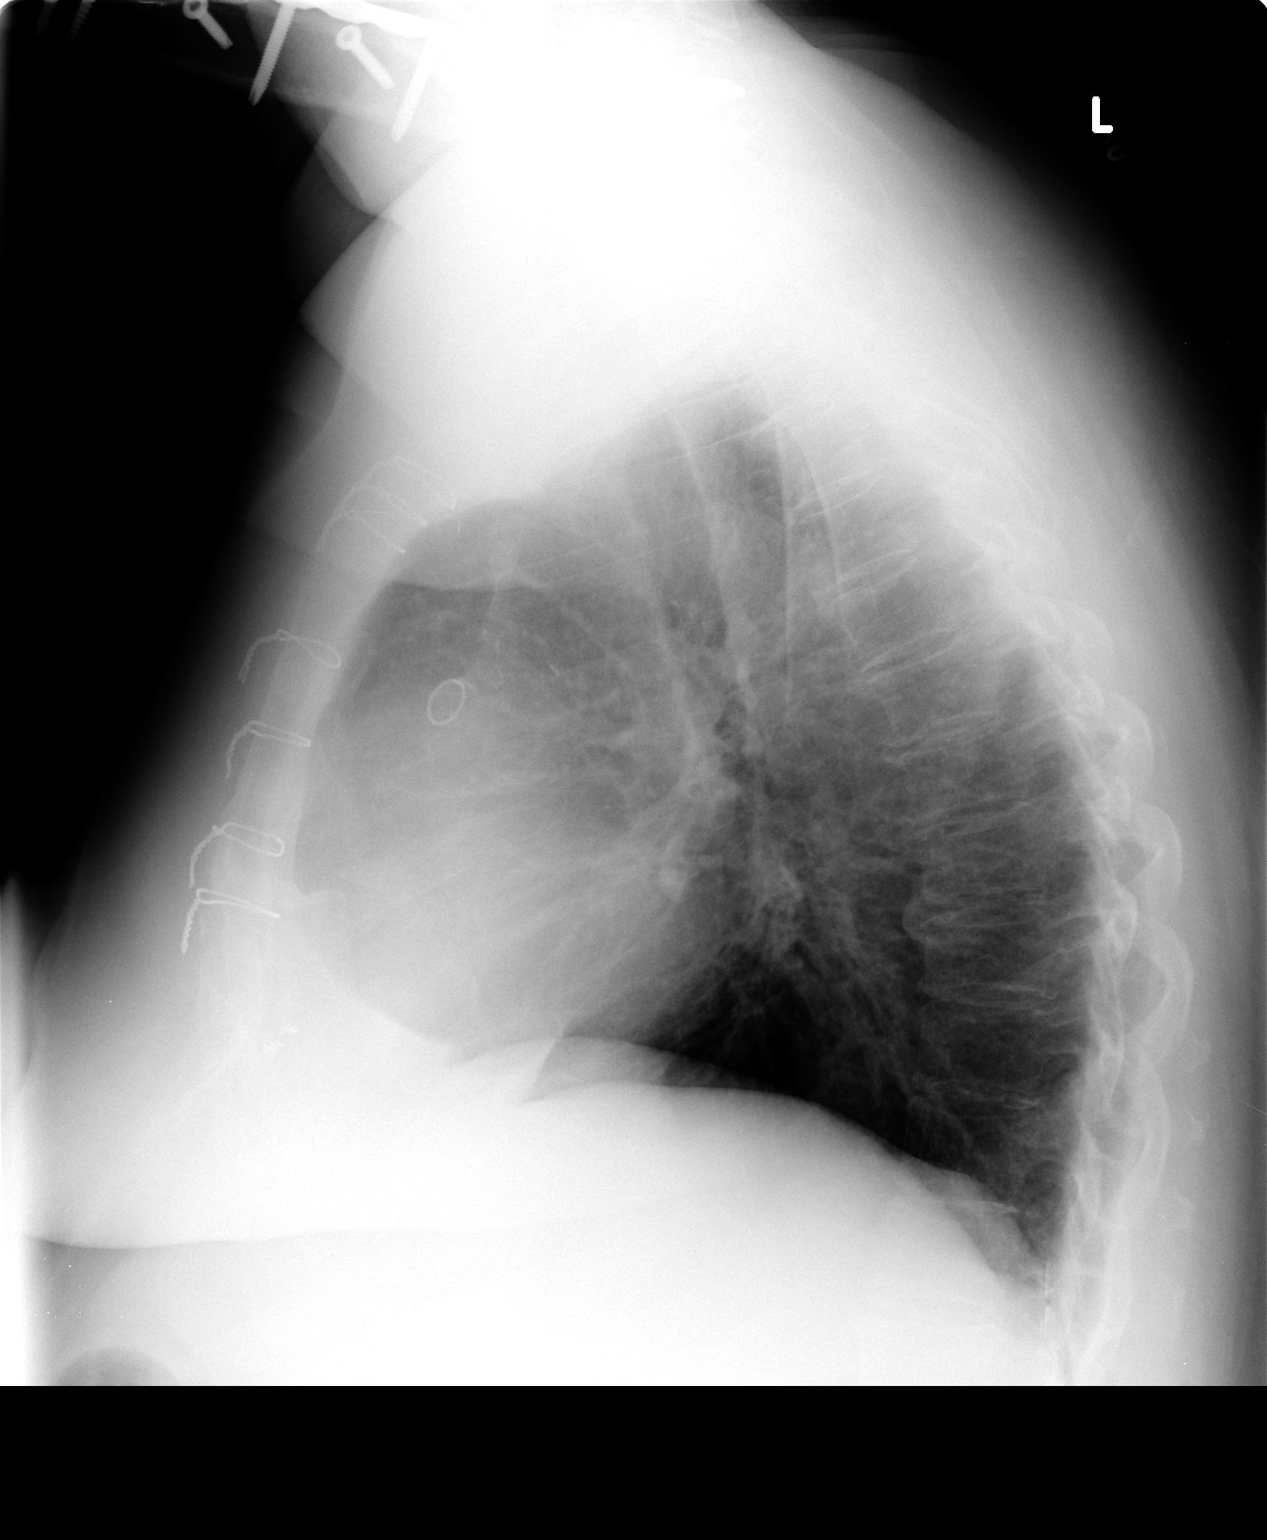

[2 of 2 positions shown; findings below may reference images not displayed]

FINDINGS: Prior CABG noted. Cardiac and mediastinal contours appear
unremarkable.

There is mild scarring in the left lung apex, stable.  Otherwise
the lungs appear clear.

Mild thoracic spondylosis noted.  Right shoulder fracture fixation
hardware and rotator cuff anchors noted.
IMPRESSION: 1.  Prior CABG and coronary artery stenting.
2.  Stable scarring at the left lung apex.

## 2012-06-09 ENCOUNTER — Ambulatory Visit (HOSPITAL_COMMUNITY)
Admission: RE | Admit: 2012-06-09 | Discharge: 2012-06-09 | Disposition: A | Discharge status: Home / Self Care | Payer: Medicare Other | Source: Ambulatory Visit | Attending: Family Medicine | Admitting: Family Medicine

## 2012-06-09 DIAGNOSIS — Z1231 Encounter for screening mammogram for malignant neoplasm of breast: Secondary | ICD-10-CM | POA: Insufficient documentation

## 2012-06-09 DIAGNOSIS — Z139 Encounter for screening, unspecified: Secondary | ICD-10-CM

## 2012-06-11 ENCOUNTER — Ambulatory Visit (HOSPITAL_COMMUNITY)
Admission: RE | Admit: 2012-06-11 | Discharge: 2012-06-11 | Disposition: A | Discharge status: Home / Self Care | Payer: Medicare Other | Source: Ambulatory Visit | Attending: Family Medicine | Admitting: Family Medicine

## 2012-06-11 ENCOUNTER — Other Ambulatory Visit (HOSPITAL_COMMUNITY): Payer: Self-pay | Admitting: Family Medicine

## 2012-06-11 DIAGNOSIS — M818 Other osteoporosis without current pathological fracture: Secondary | ICD-10-CM | POA: Insufficient documentation

## 2012-06-11 DIAGNOSIS — R52 Pain, unspecified: Secondary | ICD-10-CM

## 2012-06-11 DIAGNOSIS — M25552 Pain in left hip: Secondary | ICD-10-CM

## 2012-06-11 DIAGNOSIS — M25559 Pain in unspecified hip: Secondary | ICD-10-CM | POA: Insufficient documentation

## 2012-06-22 ENCOUNTER — Encounter (HOSPITAL_COMMUNITY): Payer: Self-pay | Admitting: *Deleted

## 2012-06-22 ENCOUNTER — Emergency Department (HOSPITAL_COMMUNITY): Payer: Medicare Other

## 2012-06-22 ENCOUNTER — Observation Stay (HOSPITAL_COMMUNITY): Payer: Medicare Other

## 2012-06-22 ENCOUNTER — Observation Stay (HOSPITAL_COMMUNITY)
Admission: EM | Admit: 2012-06-22 | Discharge: 2012-06-23 | Disposition: A | Discharge status: Home / Self Care | Payer: Medicare Other | Attending: Internal Medicine | Admitting: Internal Medicine

## 2012-06-22 DIAGNOSIS — J4489 Other specified chronic obstructive pulmonary disease: Secondary | ICD-10-CM | POA: Insufficient documentation

## 2012-06-22 DIAGNOSIS — G43109 Migraine with aura, not intractable, without status migrainosus: Secondary | ICD-10-CM

## 2012-06-22 DIAGNOSIS — R531 Weakness: Secondary | ICD-10-CM

## 2012-06-22 DIAGNOSIS — E118 Type 2 diabetes mellitus with unspecified complications: Secondary | ICD-10-CM | POA: Diagnosis present

## 2012-06-22 DIAGNOSIS — E119 Type 2 diabetes mellitus without complications: Secondary | ICD-10-CM

## 2012-06-22 DIAGNOSIS — G8929 Other chronic pain: Secondary | ICD-10-CM

## 2012-06-22 DIAGNOSIS — R51 Headache: Secondary | ICD-10-CM | POA: Insufficient documentation

## 2012-06-22 DIAGNOSIS — L409 Psoriasis, unspecified: Secondary | ICD-10-CM

## 2012-06-22 DIAGNOSIS — G4730 Sleep apnea, unspecified: Secondary | ICD-10-CM

## 2012-06-22 DIAGNOSIS — G459 Transient cerebral ischemic attack, unspecified: Principal | ICD-10-CM

## 2012-06-22 DIAGNOSIS — M549 Dorsalgia, unspecified: Secondary | ICD-10-CM

## 2012-06-22 DIAGNOSIS — E039 Hypothyroidism, unspecified: Secondary | ICD-10-CM | POA: Diagnosis present

## 2012-06-22 DIAGNOSIS — Z91199 Patient's noncompliance with other medical treatment and regimen due to unspecified reason: Secondary | ICD-10-CM

## 2012-06-22 DIAGNOSIS — I10 Essential (primary) hypertension: Secondary | ICD-10-CM

## 2012-06-22 DIAGNOSIS — Z794 Long term (current) use of insulin: Secondary | ICD-10-CM | POA: Insufficient documentation

## 2012-06-22 DIAGNOSIS — Z9119 Patient's noncompliance with other medical treatment and regimen: Secondary | ICD-10-CM

## 2012-06-22 DIAGNOSIS — I679 Cerebrovascular disease, unspecified: Secondary | ICD-10-CM

## 2012-06-22 DIAGNOSIS — R519 Headache, unspecified: Secondary | ICD-10-CM

## 2012-06-22 DIAGNOSIS — J449 Chronic obstructive pulmonary disease, unspecified: Secondary | ICD-10-CM

## 2012-06-22 DIAGNOSIS — E669 Obesity, unspecified: Secondary | ICD-10-CM

## 2012-06-22 DIAGNOSIS — E0590 Thyrotoxicosis, unspecified without thyrotoxic crisis or storm: Secondary | ICD-10-CM

## 2012-06-22 DIAGNOSIS — I2510 Atherosclerotic heart disease of native coronary artery without angina pectoris: Secondary | ICD-10-CM

## 2012-06-22 DIAGNOSIS — E785 Hyperlipidemia, unspecified: Secondary | ICD-10-CM

## 2012-06-22 DIAGNOSIS — Z9114 Patient's other noncompliance with medication regimen: Secondary | ICD-10-CM

## 2012-06-22 DIAGNOSIS — Z79899 Other long term (current) drug therapy: Secondary | ICD-10-CM | POA: Insufficient documentation

## 2012-06-22 DIAGNOSIS — M6281 Muscle weakness (generalized): Secondary | ICD-10-CM | POA: Insufficient documentation

## 2012-06-22 DIAGNOSIS — Z6836 Body mass index (BMI) 36.0-36.9, adult: Secondary | ICD-10-CM | POA: Insufficient documentation

## 2012-06-22 LAB — PROTIME-INR: Prothrombin Time: 13.1 seconds (ref 11.6–15.2)

## 2012-06-22 LAB — CBC WITH DIFFERENTIAL/PLATELET
Basophils Relative: 0 % (ref 0–1)
HCT: 44 % (ref 36.0–46.0)
Hemoglobin: 14.4 g/dL (ref 12.0–15.0)
MCHC: 32.7 g/dL (ref 30.0–36.0)
Monocytes Absolute: 0.8 10*3/uL (ref 0.1–1.0)
Monocytes Relative: 7 % (ref 3–12)
Neutro Abs: 5.9 10*3/uL (ref 1.7–7.7)

## 2012-06-22 LAB — COMPREHENSIVE METABOLIC PANEL
Albumin: 3.6 g/dL (ref 3.5–5.2)
BUN: 16 mg/dL (ref 6–23)
CO2: 25 mEq/L (ref 19–32)
Chloride: 101 mEq/L (ref 96–112)
Creatinine, Ser: 0.91 mg/dL (ref 0.50–1.10)
GFR calc Af Amer: 71 mL/min — ABNORMAL LOW (ref >90)
GFR calc non Af Amer: 61 mL/min — ABNORMAL LOW (ref >90)
Total Bilirubin: 0.2 mg/dL — ABNORMAL LOW (ref 0.3–1.2)

## 2012-06-22 LAB — GLUCOSE, CAPILLARY

## 2012-06-22 LAB — APTT: aPTT: 29 seconds (ref 24–37)

## 2012-06-22 MED ORDER — AMLODIPINE BESYLATE 5 MG PO TABS
5.0000 mg | ORAL_TABLET | ORAL | Status: DC
  Administered 2012-06-23: 5 mg via ORAL
  Filled 2012-06-22: qty 1

## 2012-06-22 MED ORDER — NITROGLYCERIN 0.4 MG SL SUBL: 0.4000 mg | SUBLINGUAL_TABLET | SUBLINGUAL | Status: DC | PRN

## 2012-06-22 MED ORDER — SODIUM CHLORIDE 0.9 % IV SOLN
INTRAVENOUS | Status: DC
  Administered 2012-06-22: via INTRAVENOUS

## 2012-06-22 MED ORDER — LISINOPRIL 10 MG PO TABS
20.0000 mg | ORAL_TABLET | Freq: Every day | ORAL | Status: DC
  Administered 2012-06-23: 20 mg via ORAL
  Filled 2012-06-22: qty 2

## 2012-06-22 MED ORDER — LISINOPRIL-HYDROCHLOROTHIAZIDE 20-12.5 MG PO TABS: 1.0000 | ORAL_TABLET | ORAL | Status: DC

## 2012-06-22 MED ORDER — LEVOTHYROXINE SODIUM 25 MCG PO TABS
25.0000 ug | ORAL_TABLET | Freq: Every day | ORAL | Status: DC
  Administered 2012-06-23: 25 ug via ORAL
  Filled 2012-06-22: qty 1

## 2012-06-22 MED ORDER — ENOXAPARIN SODIUM 40 MG/0.4ML ~~LOC~~ SOLN: 40.0000 mg | SUBCUTANEOUS | Status: DC

## 2012-06-22 MED ORDER — INSULIN ASPART 100 UNIT/ML ~~LOC~~ SOLN
0.0000 [IU] | Freq: Three times a day (TID) | SUBCUTANEOUS | Status: DC
  Administered 2012-06-23: 5 [IU] via SUBCUTANEOUS
  Administered 2012-06-23: 2 [IU] via SUBCUTANEOUS

## 2012-06-22 MED ORDER — METOCLOPRAMIDE HCL 5 MG/ML IJ SOLN
10.0000 mg | Freq: Once | INTRAMUSCULAR | Status: AC
  Administered 2012-06-22: 10 mg via INTRAVENOUS
  Filled 2012-06-22: qty 2

## 2012-06-22 MED ORDER — HYDROCHLOROTHIAZIDE 12.5 MG PO CAPS
12.5000 mg | ORAL_CAPSULE | Freq: Every day | ORAL | Status: DC
  Administered 2012-06-23: 12.5 mg via ORAL
  Filled 2012-06-22: qty 1

## 2012-06-22 MED ORDER — DIPHENHYDRAMINE HCL 50 MG/ML IJ SOLN
25.0000 mg | Freq: Once | INTRAMUSCULAR | Status: AC
  Administered 2012-06-22: 50 mg via INTRAVENOUS
  Filled 2012-06-22: qty 1

## 2012-06-22 MED ORDER — TRAMADOL HCL 50 MG PO TABS
50.0000 mg | ORAL_TABLET | Freq: Four times a day (QID) | ORAL | Status: DC | PRN
  Administered 2012-06-22 – 2012-06-23 (×2): 50 mg via ORAL
  Filled 2012-06-22 (×2): qty 1

## 2012-06-22 MED ORDER — SIMVASTATIN 20 MG PO TABS
40.0000 mg | ORAL_TABLET | Freq: Every day | ORAL | Status: DC
  Administered 2012-06-22: 40 mg via ORAL
  Filled 2012-06-22: qty 2

## 2012-06-22 MED ORDER — ASPIRIN 325 MG PO TABS
325.0000 mg | ORAL_TABLET | Freq: Every day | ORAL | Status: DC
  Administered 2012-06-22 – 2012-06-23 (×2): 325 mg via ORAL
  Filled 2012-06-22 (×2): qty 1

## 2012-06-22 MED ORDER — INSULIN GLARGINE 100 UNIT/ML ~~LOC~~ SOLN
60.0000 [IU] | Freq: Every day | SUBCUTANEOUS | Status: DC
  Administered 2012-06-22: 60 [IU] via SUBCUTANEOUS

## 2012-06-22 MED ORDER — SODIUM CHLORIDE 0.9 % IV SOLN
INTRAVENOUS | Status: DC
  Administered 2012-06-22: 18:00:00 via INTRAVENOUS

## 2012-06-22 MED ORDER — ENOXAPARIN SODIUM 40 MG/0.4ML ~~LOC~~ SOLN
40.0000 mg | SUBCUTANEOUS | Status: DC
  Administered 2012-06-22: 40 mg via SUBCUTANEOUS
  Filled 2012-06-22: qty 0.4

## 2012-06-22 MED ORDER — METFORMIN HCL 500 MG PO TABS
500.0000 mg | ORAL_TABLET | Freq: Two times a day (BID) | ORAL | Status: DC
  Administered 2012-06-23: 500 mg via ORAL
  Filled 2012-06-22: qty 1

## 2012-06-22 MED ORDER — TRIMETHOPRIM 100 MG PO TABS
100.0000 mg | ORAL_TABLET | Freq: Every day | ORAL | Status: DC
  Administered 2012-06-23: 100 mg via ORAL
  Filled 2012-06-22 (×2): qty 1

## 2012-06-22 MED ORDER — INSULIN ASPART 100 UNIT/ML ~~LOC~~ SOLN: 0.0000 [IU] | Freq: Every day | SUBCUTANEOUS | Status: DC

## 2012-06-22 NOTE — ED Notes (Signed)
Hospitalist at bedside to examine.

## 2012-06-22 NOTE — ED Notes (Addendum)
Reports "I just feel foolish"-states this was after medicated for pain; reports decreased HA pain rates 6/10 down from 9/10.  VSS.  A&ox4; answers questions appropriately.  Speech clear.

## 2012-06-22 NOTE — ED Provider Notes (Cosign Needed)
History     CSN: [id number]  Arrival date & time 06/22/12  1636   First MD Initiated Contact with Patient 06/22/12 1638      Chief Complaint Headache  (Consider location/radiation/quality/duration/timing/severity/associated sxs/prior treatment) HPI  Patient states somewhere around 2 or 2:30 this afternoon she was sitting on the couch and had acute onset of feeling a bump on the left side of her head immediately followed by a severe left-sided headache. She states is unusual for her to have headaches. She states it is a throbbing pain. She states her left eye feels numb. She denies nausea, vomiting, but does describe some chest pain that started earlier today described as a tightness. She also states she has shortness of breath with it and got diaphoretic and is having wheezing. She also states when she walks she feels like she is off balance and falling. She states she may have some visual changes but denies any black spots or black curtains. She denies nausea or vomiting. She relates she's had CABG done years ago and she's been having chest pains for a long time but has not mentioned it to her doctor. She reports she was admitted to the hospital about a month ago for possible stroke but she states they did not change her medication at that time. She takes aspirin 81 mg daily   PCP Dr. Renard Matter Cardiologist Dr. Dietrich Pates  Past Medical History  Diagnosis Date  . Arteriosclerotic cardiovascular disease (ASCVD)      CABG surgery in 2001; nl stress nuclear-2007; angiography in 01/2006- TO of LAD; 70% ostial stenosis      of a small ramus; patent grafts; normal EF; cath in 12/2009 essentially unchanged; EF-66%;  Congestive heart failure with preserved LV systolic function  . Hypertension   . Obstructive sleep apnea   . Hyperthyroidism   . Cerebrovascular disease     H/o CVA  . Chronic back pain   . Vestibular neuritis     ataxia  . Varicose veins   . Diabetes mellitus     Requires insulin  .  Acute renal failure     Possibly contrast-induced  . Obesity   . Chronic obstructive pulmonary disease     Chronic bronchitis  . Fall 2009    right humeral fracture  . Peripheral neuropathy     Left lower extremity; possibly radiculopathy    Past Surgical History  Procedure Date  . Coronary artery bypass graft 6/01    SVG to diagonal, LIMA to LAD  . Rotator cuff repair   . Appendectomy   . Cholecystectomy   . Total abdominal hysterectomy   . Lumbar spine surgery     X2  . Knee surgery     Arthroscopic  . Carpal tunnel release     Bilateral  . Orif humerus fracture 2009    Family History  Problem Relation Age of Onset  . Coronary artery disease      Multiple first and second-degree relatives  . Aneurysm      Cerebral circulation  . Hypertension Mother   . Colon cancer Sister   . Clotting disorder      Children diagnosed with hypercoagulable state    History  Substance Use Topics  . Smoking status: Never Smoker   . Smokeless tobacco: Never Used  . Alcohol Use: No  lives at home Lives with husband  OB History    Grav Para Term Preterm Abortions TAB SAB Ect Mult Living  Review of Systems  All other systems reviewed and are negative.    Allergies  Codeine  Home Medications   Current Outpatient Rx  Name Route Sig Dispense Refill  . TRIMETHOPRIM 100 MG PO TABS Oral Take 100 mg by mouth at bedtime.    [name] AMLODIPINE BESYLATE 5 MG PO TABS Oral Take 1 tablet (5 mg total) by mouth daily. 90 tablet 3  . HYDROCODONE-ACETAMINOPHEN 10-500 MG PO TABS Oral Take 0.5-1 tablets by mouth Once daily as needed. For pain    . INSULIN GLARGINE 100 UNIT/ML [place] SOLN Subcutaneous Inject 60 Units into the skin at bedtime.      . INSULIN GLULISINE 100 UNIT/ML [place] SOLN Subcutaneous Inject 20 Units into the skin 3 (three) times daily before meals.      [name] LEVOTHYROXINE SODIUM 25 MCG PO TABS Oral Take 25 mcg by mouth daily.      [name] LISINOPRIL-HYDROCHLOROTHIAZIDE  20-12.5 MG PO TABS Oral Take 1 tablet by mouth daily. 90 tablet 3  . METFORMIN HCL 500 MG PO TABS Oral Take 500 mg by mouth 2 (two) times daily with a meal.      . NITROGLYCERIN 0.4 MG SL SUBL Sublingual Place 0.4 mg under the tongue every 5 (five) minutes as needed.      [name] PRAVASTATIN SODIUM 80 MG PO TABS Oral Take 1 tablet (80 mg total) by mouth daily. 90 tablet 3  . TRIAMCINOLONE ACETONIDE 0.1 % EX CREA Topical Apply topically 2 (two) times daily. 30 g 3    BP 145/76  Pulse 72  Temp 98 F (36.7 C) (Oral)  Ht 5' 4.5" (1.638 m)  Wt 208 lb (94.348 kg)  BMI 35.15 kg/m2  SpO2 95%  Vital signs normal    Physical Exam  Nursing note and vitals reviewed. Constitutional: She is oriented to person, place, and time. She appears well-developed and well-nourished.  Non-toxic appearance. She does not appear ill. No distress.  HENT:  Head: Normocephalic and atraumatic.  Right Ear: External ear normal.  Left Ear: External ear normal.  Nose: Nose normal. No mucosal edema or rhinorrhea.  Mouth/Throat: Oropharynx is clear and moist and mucous membranes are normal. No dental abscesses or uvula swelling.  Eyes: Conjunctivae and EOM are normal. Pupils are equal, round, and reactive to light.  Neck: Normal range of motion and full passive range of motion without pain. Neck supple.  Cardiovascular: Normal rate, regular rhythm and normal heart sounds.  Exam reveals no gallop and no friction rub.   No murmur heard. Pulmonary/Chest: Effort normal. No respiratory distress. She has wheezes. She has no rhonchi. She has no rales. She exhibits no tenderness and no crepitus.  Abdominal: Soft. Normal appearance and bowel sounds are normal. She exhibits no distension. There is no tenderness. There is no rebound and no guarding.  Musculoskeletal: Normal range of motion. She exhibits no edema and no tenderness.       Moves all extremities well.   Neurological: She is alert and oriented to person, place, and time.  She has normal strength. No cranial nerve deficit.       No numbness to her face she can feel light touch on both sides. Patient has difficulty with finger to nose with her left hand. She also has difficulty doing shin rubs with her left leg. She has some pronator drift on the left. Left grip is weak compared to the right. She has difficulty lifting her left leg against gravity although she can.  Skin:  Skin is warm, dry and intact. No rash noted. No erythema. No pallor.  Psychiatric: She has a normal mood and affect. Her speech is normal and behavior is normal. Her mood appears not anxious.    ED Course  Procedures (including critical care time)   Medications  0.9 %  sodium chloride infusion (  Intravenous New Bag/Given 06/22/12 1740)  metoCLOPramide (REGLAN) injection 10 mg (10 mg Intravenous Given 06/22/12 1840)  diphenhydrAMINE (BENADRYL) injection 25 mg (50 mg Intravenous Given 06/22/12 1840)   CODE STROKE CALLED DURING EXAM  18:04 Teleneurologist discussed pt  Pt admitted in Feb with similar and had neg CT head, MRI/MRA for stroke 18:15 Teleneurologist states not tPA candiate beyond the 3 hr window and not sure if she has had a stroke, ? Complicated migraine, needs admission for MRA, and EEG 18:55 Dr Ouida Sills states Dr Renard Matter is out of town and [facility] is going to admit his patients for the next week . 19:30 Pt requesting to eat, states her headache is better.  19:51 Dr Orvan Falconer accepts to admit to tele   Results for orders placed during the hospital encounter of 06/22/12  CBC WITH DIFFERENTIAL      Component Value Range   WBC 10.6 (*) 4.0 - 10.5 K/uL   RBC 4.93  3.87 - 5.11 MIL/uL   Hemoglobin 14.4  12.0 - 15.0 g/dL   HCT 16.1  09.6 - 04.5 %   MCV 89.2  78.0 - 100.0 fL   MCH 29.2  26.0 - 34.0 pg   MCHC 32.7  30.0 - 36.0 g/dL   RDW 40.9  81.1 - 91.4 %   Platelets 257  150 - 400 K/uL   Neutrophils Relative 55  43 - 77 %   Neutro Abs 5.9  1.7 - 7.7 K/uL   Lymphocytes Relative 33  12 -  46 %   Lymphs Abs 3.5  0.7 - 4.0 K/uL   Monocytes Relative 7  3 - 12 %   Monocytes Absolute 0.8  0.1 - 1.0 K/uL   Eosinophils Relative 4  0 - 5 %   Eosinophils Absolute 0.4  0.0 - 0.7 K/uL   Basophils Relative 0  0 - 1 %   Basophils Absolute 0.0  0.0 - 0.1 K/uL  COMPREHENSIVE METABOLIC PANEL      Component Value Range   Sodium 136  135 - 145 mEq/L   Potassium 4.4  3.5 - 5.1 mEq/L   Chloride 101  96 - 112 mEq/L   CO2 25  19 - 32 mEq/L   Glucose, Bld 235 (*) 70 - 99 mg/dL   BUN 16  6 - 23 mg/dL   Creatinine, Ser 7.82  0.50 - 1.10 mg/dL   Calcium 9.6  8.4 - 95.6 mg/dL   Total Protein 7.0  6.0 - 8.3 g/dL   Albumin 3.6  3.5 - 5.2 g/dL   AST 20  0 - 37 U/L   ALT 15  0 - 35 U/L   Alkaline Phosphatase 78  39 - 117 U/L   Total Bilirubin 0.2 (*) 0.3 - 1.2 mg/dL   GFR calc non Af Amer 61 (*) >90 mL/min   GFR calc Af Amer 71 (*) >90 mL/min  APTT      Component Value Range   aPTT 29  24 - 37 seconds  PROTIME-INR      Component Value Range   Prothrombin Time 13.1  11.6 - 15.2 seconds   INR 0.97  0.00 -  1.49  TROPONIN I      Component Value Range   Troponin I <0.30  <0.30 ng/mL  GLUCOSE, CAPILLARY      Component Value Range   Glucose-Capillary 178 (*) 70 - 99 mg/dL   Laboratory interpretation all normal except hyperglycemia    Date: 06/22/2012  Rate: 72  Rhythm: normal sinus rhythm and premature ventricular contractions (PVC)  QRS Axis: left  Intervals: normal  ST/T Wave abnormalities: normal  Conduction Disutrbances:none  Narrative Interpretation: PRWP  Old EKG Reviewed: unchanged from 01/29/2012     1. Complicated migraine   2. Weakness of left side of body   3. TIA (transient ischemic attack)     Plan to admit  Devoria Albe, MD, FACEP   MDM          Ward Givens, MD 06/22/12 1945

## 2012-06-22 NOTE — ED Notes (Signed)
Sudden onset headache, dizzy,   Lt side of throat does not "feel right".  Alert, talking

## 2012-06-22 NOTE — H&P (Signed)
PCP:   Alice Reichert, MD   Chief Complaint:  Acute headache since today  HPI: [name] [name] is an 76 y.o. female.   Elderly obese Caucasian lady with multiple vascular risk factors, and a history of recurrent admissions and evaluations for unilateral headache rest and will stroke, or sense the emergency room this afternoon with a history of sudden onset of a left-sided pain, followed by a throbbing left-sided headache, weakness on the left side of her body, and an unsteady gait.  Emergency room patient was evaluated, no acute neurological problems were found; the headache was significantly improved with intravenous Reglan and Benadryl, and patient had a tele-neuro consult, and the emergency room physician was advised to admit the patient for an MRA evaluation in the morning to rule out stroke.  She denies any speech or swallowing or visual disturbance; she reports the headache persisted at a low level. She admits to still being mostly noncompliant with her daily aspirin, despite this repeated discussions about this in the past. She is no longer taking Plavix as she has been advised to discontinue this.  Admits to being noncompliant with CPAP for his obstructive sleep apnea, despite discussions about this in the past with her neurologist Dr. Gerilyn Pilgrim.  She lives with her elderly husband and has no other caregiver assistance.  She alleges she only rarely takes the hydrocodone which is prescribed for her chronic back and arthritis pain  Rewiew of Systems:  The patient denies anorexia, fever, weight loss,, vision loss, decreased hearing, hoarseness, chest pain, syncope, dyspnea on exertion, peripheral edema,  hemoptysis, abdominal pain, melena, hematochezia, severe indigestion/heartburn, hematuria, incontinence, genital sores, muscle weakness, suspicious skin lesions, transient blindness,  depression, unusual weight change, abnormal bleeding, enlarged lymph nodes, angioedema, and breast  masses.   Past Medical History  Diagnosis Date  . Arteriosclerotic cardiovascular disease (ASCVD)      CABG surgery in 2001; nl stress nuclear-2007; angiography in 01/2006- TO of LAD; 70% ostial stenosis      of a small ramus; patent grafts; normal EF; cath in 12/2009 essentially unchanged; EF-66%;  Congestive heart failure with preserved LV systolic function  . Hypertension   . Obstructive sleep apnea   . Hyperthyroidism   . Cerebrovascular disease     H/o CVA  . Chronic back pain   . Vestibular neuritis     ataxia  . Varicose veins   . Diabetes mellitus     Requires insulin  . Acute renal failure     Possibly contrast-induced  . Obesity   . Chronic obstructive pulmonary disease     Chronic bronchitis  . Fall 2009    right humeral fracture  . Peripheral neuropathy     Left lower extremity; possibly radiculopathy  . Back pain, chronic     Past Surgical History  Procedure Date  . Coronary artery bypass graft 6/01    SVG to diagonal, LIMA to LAD  . Rotator cuff repair   . Appendectomy   . Cholecystectomy   . Total abdominal hysterectomy   . Lumbar spine surgery     X2  . Knee surgery     Arthroscopic  . Carpal tunnel release     Bilateral  . Orif humerus fracture 2009    Medications:  HOME MEDS: Prior to Admission medications   Medication Sig Start Date End Date Taking? Authorizing Provider  amLODipine (NORVASC) 5 MG tablet Take 5 mg by mouth every morning. 01/07/12 01/06/13 Yes Kathlen Brunswick, MD  DIAZEPAM PO Take 1 tablet by mouth at bedtime as needed.   Yes Historical Provider, MD  HYDROcodone-acetaminophen (LORTAB) 10-500 MG per tablet Take 0.5-1 tablets by mouth Once daily as needed. For pain 12/26/11  Yes Historical Provider, MD  insulin glargine (LANTUS SOLOSTAR) 100 UNIT/ML injection Inject 60 Units into the skin at bedtime.     Yes Historical Provider, MD  insulin glulisine (APIDRA) 100 UNIT/ML injection Inject 20 Units into the skin 3 (three) times daily  before meals.    Yes Historical Provider, MD  levothyroxine (SYNTHROID, LEVOTHROID) 25 MCG tablet Take 25 mcg by mouth every morning.    Yes Historical Provider, MD  lisinopril-hydrochlorothiazide (PRINZIDE,ZESTORETIC) 20-12.5 MG per tablet Take 1 tablet by mouth every morning. 01/07/12 01/06/13 Yes Kathlen Brunswick, MD  metFORMIN (GLUCOPHAGE) 500 MG tablet Take 500 mg by mouth 2 (two) times daily with a meal.     Yes Historical Provider, MD  nitroGLYCERIN (NITROSTAT) 0.4 MG SL tablet Place 0.4 mg under the tongue every 5 (five) minutes as needed.     Yes Historical Provider, MD  pravastatin (PRAVACHOL) 80 MG tablet Take 80 mg by mouth every evening. 01/07/12 01/06/13 Yes Kathlen Brunswick, MD  triamcinolone cream (KENALOG) 0.1 % Apply topically 2 (two) times daily. 01/07/12 01/06/13 Yes Kathlen Brunswick, MD  trimethoprim (TRIMPEX) 100 MG tablet Take 100 mg by mouth at bedtime.   Yes Historical Provider, MD     Allergies:  Allergies  Allergen Reactions  . Codeine Itching    Social History:   reports that she has never smoked. She has never used smokeless tobacco. She reports that she does not drink alcohol or use illicit drugs.  Family History: Family History  Problem Relation Age of Onset  . Coronary artery disease      Multiple first and second-degree relatives  . Aneurysm      Cerebral circulation  . Hypertension Mother   . Colon cancer Sister   . Clotting disorder      Children diagnosed with hypercoagulable state     Physical Exam: Filed Vitals:   06/22/12 1649 06/22/12 1843 06/22/12 1900 06/22/12 1945  BP: 145/76  157/68 116/75  Pulse: 72  72 72  Temp: 98 F (36.7 C) 97.5 F (36.4 C)  98 F (36.7 C)  TempSrc: Oral   Oral  Resp:    20  Height:      Weight:      SpO2: 95%  96% 95%   Blood pressure 116/75, pulse 72, temperature 98 F (36.7 C), temperature source Oral, resp. rate 20, height 5' 4.5" (1.638 m), weight 94.348 kg (208 lb), SpO2 95.00%.  GEN:  Obese  Caucasian lady reclining in the stretcher in no acute distress; cooperative with exam PSYCH:  alert and oriented x4; does not appear anxious or depressed; affect is appropriate. HEENT: Mucous membranes pink and anicteric; PERRLA; EOM intact; no cervical lymphadenopathy nor thyromegaly or carotid bruit; no JVD; there is a suggestion of a right facial weakness but it is not pronounced; she denies right-sided weakness Breasts:: Not examined CHEST WALL: No tenderness CHEST: Normal respiration, clear to auscultation bilaterally HEART: Regular rate and rhythm; no murmurs rubs or gallops BACK: ; no CVA tenderness ABDOMEN: Obese, soft non-tender; no masses, no organomegaly, normal abdominal bowel sounds; ; no intertriginous candida. Rectal Exam: Not done EXTREMITIES: age-appropriate arthropathy of the hands and knees; no edema; no ulcerations. Genitalia: not examined PULSES: 2+ and symmetric SKIN: Normal hydration no rash or  ulceration CNS: Cranial nerves 2-12 grossly intact no focal lateralizing neurologic deficit; not sufficiently cooperative for deep tendon reflexes; density is upgoing bilaterally; gait was not tested   Labs & Imaging Results for orders placed during the hospital encounter of 06/22/12 (from the past 48 hour(s))  CBC WITH DIFFERENTIAL     Status: Abnormal   Collection Time   06/22/12  4:50 PM      Component Value Range Comment   WBC 10.6 (*) 4.0 - 10.5 K/uL    RBC 4.93  3.87 - 5.11 MIL/uL    Hemoglobin 14.4  12.0 - 15.0 g/dL    HCT 16.1  09.6 - 04.5 %    MCV 89.2  78.0 - 100.0 fL    MCH 29.2  26.0 - 34.0 pg    MCHC 32.7  30.0 - 36.0 g/dL    RDW 40.9  81.1 - 91.4 %    Platelets 257  150 - 400 K/uL    Neutrophils Relative 55  43 - 77 %    Neutro Abs 5.9  1.7 - 7.7 K/uL    Lymphocytes Relative 33  12 - 46 %    Lymphs Abs 3.5  0.7 - 4.0 K/uL    Monocytes Relative 7  3 - 12 %    Monocytes Absolute 0.8  0.1 - 1.0 K/uL    Eosinophils Relative 4  0 - 5 %    Eosinophils Absolute  0.4  0.0 - 0.7 K/uL    Basophils Relative 0  0 - 1 %    Basophils Absolute 0.0  0.0 - 0.1 K/uL   COMPREHENSIVE METABOLIC PANEL     Status: Abnormal   Collection Time   06/22/12  4:50 PM      Component Value Range Comment   Sodium 136  135 - 145 mEq/L    Potassium 4.4  3.5 - 5.1 mEq/L    Chloride 101  96 - 112 mEq/L    CO2 25  19 - 32 mEq/L    Glucose, Bld 235 (*) 70 - 99 mg/dL    BUN 16  6 - 23 mg/dL    Creatinine, Ser 7.82  0.50 - 1.10 mg/dL    Calcium 9.6  8.4 - 95.6 mg/dL    Total Protein 7.0  6.0 - 8.3 g/dL    Albumin 3.6  3.5 - 5.2 g/dL    AST 20  0 - 37 U/L SLIGHT HEMOLYSIS   ALT 15  0 - 35 U/L    Alkaline Phosphatase 78  39 - 117 U/L    Total Bilirubin 0.2 (*) 0.3 - 1.2 mg/dL    GFR calc non Af Amer 61 (*) >90 mL/min    GFR calc Af Amer 71 (*) >90 mL/min   APTT     Status: Normal   Collection Time   06/22/12  4:50 PM      Component Value Range Comment   aPTT 29  24 - 37 seconds   PROTIME-INR     Status: Normal   Collection Time   06/22/12  4:50 PM      Component Value Range Comment   Prothrombin Time 13.1  11.6 - 15.2 seconds    INR 0.97  0.00 - 1.49   TROPONIN I     Status: Normal   Collection Time   06/22/12  4:50 PM      Component Value Range Comment   Troponin I <0.30  <0.30 ng/mL   GLUCOSE, CAPILLARY  Status: Abnormal   Collection Time   06/22/12  5:32 PM      Component Value Range Comment   Glucose-Capillary 178 (*) 70 - 99 mg/dL    Ct Head Wo Contrast  06/22/2012  *RADIOLOGY REPORT*  Clinical Data: Acute left sided headache and weakness  CT HEAD WITHOUT CONTRAST  Technique:  Contiguous axial images were obtained from the base of the skull through the vertex without contrast.  Comparison: 01/29/2012  Findings: There is diffuse patchy low density throughout the subcortical and periventricular white matter consistent with chronic small vessel ischemic change.  There is prominence of the sulci and ventricles consistent with brain atrophy.  There is no evidence for  acute brain infarct, hemorrhage or mass.  Paranasal sinuses and mastoid air cells are clear.  The skull is intact.  IMPRESSION:  1.  No acute intracranial abnormalities. 2.  Small vessel ischemic disease and brain atrophy.  Original Report Authenticated By: Rosealee Albee, M.D.      Assessment Present on Admission:  .TIA (transient ischemic attack), i .Left-sided headache. Possible migraine  .Arteriosclerotic cardiovascular disease (ASCVD) .Cerebrovascular disease, noncompliant with aspirin  .Chronic back pain .Chronic obstructive pulmonary disease .DIABETES MELLITUS, TYPE II .HYPERLIPIDEMIA .HYPERTENSION .HYPERTHYROIDISM .OBESITY .SLEEP APNEA, noncompliant with CPAP   PLAN: We'll bring this lady on observation on the TIA protocol; give high-dose aspirin;  MRI MRA in the morning; discharge home if no evidence of acute stroke Counsel on the importance of compliance with medical therapy maintenance of optimal health Counsel on the importance of weight loss  Other plans as per orders.   Jakai Risse 06/22/2012, 8:24 PM

## 2012-06-23 ENCOUNTER — Observation Stay (HOSPITAL_COMMUNITY): Payer: Medicare Other

## 2012-06-23 ENCOUNTER — Encounter (HOSPITAL_COMMUNITY): Payer: Self-pay | Admitting: *Deleted

## 2012-06-23 ENCOUNTER — Other Ambulatory Visit (HOSPITAL_COMMUNITY): Payer: Medicare Other

## 2012-06-23 DIAGNOSIS — G459 Transient cerebral ischemic attack, unspecified: Principal | ICD-10-CM

## 2012-06-23 DIAGNOSIS — G43109 Migraine with aura, not intractable, without status migrainosus: Secondary | ICD-10-CM

## 2012-06-23 DIAGNOSIS — M6281 Muscle weakness (generalized): Secondary | ICD-10-CM

## 2012-06-23 DIAGNOSIS — R51 Headache: Secondary | ICD-10-CM

## 2012-06-23 DIAGNOSIS — E119 Type 2 diabetes mellitus without complications: Secondary | ICD-10-CM

## 2012-06-23 DIAGNOSIS — I10 Essential (primary) hypertension: Secondary | ICD-10-CM

## 2012-06-23 LAB — HEMOGLOBIN A1C
Hgb A1c MFr Bld: 9.1 % — ABNORMAL HIGH (ref <5.7)
Mean Plasma Glucose: 214 mg/dL — ABNORMAL HIGH (ref <117)

## 2012-06-23 LAB — CBC
HCT: 43.1 % (ref 36.0–46.0)
Hemoglobin: 14.1 g/dL (ref 12.0–15.0)
MCHC: 32.7 g/dL (ref 30.0–36.0)
MCV: 88.9 fL (ref 78.0–100.0)

## 2012-06-23 LAB — BASIC METABOLIC PANEL
BUN: 14 mg/dL (ref 6–23)
CO2: 25 mEq/L (ref 19–32)
Chloride: 102 mEq/L (ref 96–112)
GFR calc Af Amer: >90 mL/min (ref >90)
Glucose, Bld: 191 mg/dL — ABNORMAL HIGH (ref 70–99)
Potassium: 3.6 mEq/L (ref 3.5–5.1)

## 2012-06-23 LAB — LIPID PANEL
Cholesterol: 184 mg/dL (ref 0–200)
Triglycerides: 368 mg/dL — ABNORMAL HIGH (ref <150)
VLDL: 74 mg/dL — ABNORMAL HIGH (ref 0–40)

## 2012-06-23 LAB — GLUCOSE, CAPILLARY
Glucose-Capillary: 168 mg/dL — ABNORMAL HIGH (ref 70–99)
Glucose-Capillary: 299 mg/dL — ABNORMAL HIGH (ref 70–99)

## 2012-06-23 MED ORDER — ASPIRIN 325 MG PO TABS: 325.0000 mg | ORAL_TABLET | Freq: Every day | ORAL | Status: AC

## 2012-06-23 MED ORDER — ATORVASTATIN CALCIUM 20 MG PO TABS: 20.0000 mg | ORAL_TABLET | Freq: Every day | ORAL | Status: DC

## 2012-06-23 NOTE — Evaluation (Signed)
Physical Therapy Evaluation [name] [name] Details Name: [name] [name] MRN: [id number] DOB: [birth year] Today's Date: 06/23/2012 Time: 4098-1191 PT Time Calculation (min): 26 min  PT Assessment / Plan / Recommendation Clinical Impression  [name] [name] was referred to PT s/p TIA.  She is currently independent with all mobility.  her c/co is feeling a "foggy" which may be a result of lack of sleep from the night before.  She reports decreased back pain after walking.  She is able to turn, ascend and descend stairs and walk independently.  No further PT recommended.    PT Assessment  Patent does not need any further PT services    Follow Up Recommendations  No PT follow up    Barriers to Discharge        Equipment Recommendations  None recommended by PT    Recommendations for Other Services     Frequency      Precautions / Restrictions     Pertinent Vitals/Pain Pain to her low back, reports she would feel better if she was moving.       Mobility  Bed Mobility Bed Mobility: Rolling Right Rolling Right: 7: Independent Transfers Transfers: Sit to Stand;Stand to Sit Sit to Stand: 7: Independent Stand to Sit: 7: Independent Details for Transfer Assistance: STS: from EOB to Toilet to walking to Chair.  Independent with all pericare Ambulation/Gait Ambulation/Gait Assistance: 6: Modified independent (Device/Increase time);7: Independent Ambulation Distance (Feet): 300 Feet Assistive device: Other (Comment) (IV pole and occasionally touches hallway railings) Gait Pattern: Within Functional Limits Stairs: Yes Stairs Assistance: 6: Modified independent (Device/Increase time) Stair Management Technique: Two rails;Alternating pattern Number of Stairs: 2     Exercises     PT Diagnosis:    PT Problem List:   PT Treatment Interventions:     PT Goals    Visit Information  Last PT Received On: 06/23/12 PT/OT Co-Evaluation/Treatment: Yes    Subjective Data  Subjective: I feel  kind of funny, but overall I guess I am ok.    Prior Functioning  Home Living Lives With: Spouse Type of Home: House Home Access: Stairs to enter Secretary/administrator of Steps: 2 Entrance Stairs-Rails: Right;Left;Can reach both Home Layout: One level Bathroom Shower/Tub: Engineer, manufacturing systems: Standard Home Adaptive Equipment: Shower chair with back Prior Function Level of Independence: Independent Able to Take Stairs?: Reciprically Driving: Yes Vocation: Retired Comments: Pt has chronic back pain.  Communication Communication: No difficulties Dominant Hand: Right    Cognition  Overall Cognitive Status: Appears within functional limits for tasks assessed/performed Arousal/Alertness: Awake/alert Orientation Level: Appears intact for tasks assessed    Extremity/Trunk Assessment Right Upper Extremity Assessment RUE ROM/Strength/Tone: Within functional levels Left Upper Extremity Assessment LUE ROM/Strength/Tone: Within functional levels Right Lower Extremity Assessment RLE ROM/Strength/Tone: Within functional levels Left Lower Extremity Assessment LLE ROM/Strength/Tone: Within functional levels Trunk Assessment Trunk Assessment: Normal   Balance    End of Session PT - End of Session Equipment Utilized During Treatment: Gait belt Activity Tolerance: [name] [name] tolerated treatment well [name] [name] left: in chair;with call bell/phone within reach  GP     [name] [name] 06/23/2012, 8:31 AM

## 2012-06-23 NOTE — Discharge Summary (Signed)
Physician Discharge Summary  Patient ID: [name] [name] MRN: [id number] DOB/AGE: Aug 17, 1936 77 y.o.  Admit date: 06/22/2012 Discharge date: 06/23/2012  Discharge Diagnoses:  Principal Problem:  *TIA (transient ischemic attack) Active Problems:  Left-sided headache  DIABETES MELLITUS, TYPE II  HYPERTENSION  SLEEP APNEA  Hypothyroidism  HYPERLIPIDEMIA  OBESITY  Chronic back pain  Chronic obstructive pulmonary disease  Noncompliance  Noncompliance with CPAP treatment  Arteriosclerotic cardiovascular disease (ASCVD)  Cerebrovascular disease  Tests pending at the time of discharge:  Echo  Medication List  As of 06/23/2012  3:06 PM   TAKE these medications         amLODipine 5 MG tablet   Commonly known as: NORVASC   Take 5 mg by mouth every morning.      aspirin 325 MG tablet   Take 1 tablet (325 mg total) by mouth daily.      DIAZEPAM PO   Take 1 tablet by mouth at bedtime as needed.      HYDROcodone-acetaminophen 10-500 MG per tablet   Commonly known as: LORTAB   Take 0.5-1 tablets by mouth Once daily as needed. For pain      insulin glulisine 100 UNIT/ML injection   Commonly known as: APIDRA   Inject 20 Units into the skin 3 (three) times daily before meals.      LANTUS SOLOSTAR 100 UNIT/ML injection   Generic drug: insulin glargine   Inject 60 Units into the skin at bedtime.      levothyroxine 25 MCG tablet   Commonly known as: SYNTHROID, LEVOTHROID   Take 25 mcg by mouth every morning.      lisinopril-hydrochlorothiazide 20-12.5 MG per tablet   Commonly known as: PRINZIDE,ZESTORETIC   Take 1 tablet by mouth every morning.      metFORMIN 500 MG tablet   Commonly known as: GLUCOPHAGE   Take 500 mg by mouth 2 (two) times daily with a meal.      nitroGLYCERIN 0.4 MG SL tablet   Commonly known as: NITROSTAT   Place 0.4 mg under the tongue every 5 (five) minutes as needed.      pravastatin 80 MG tablet   Commonly known as: PRAVACHOL   Take 80 mg by mouth  every evening.      triamcinolone cream 0.1 %   Commonly known as: KENALOG   Apply topically 2 (two) times daily.      trimethoprim 100 MG tablet   Commonly known as: TRIMPEX   Take 100 mg by mouth at bedtime.            Discharge Orders    Future Orders Please Complete By Expires   Diet - low sodium heart healthy      Activity as tolerated - No restrictions         Follow-up Information    Follow up with Alice Reichert, MD. Schedule an appointment as soon as possible for a visit in 2 weeks.   Contact information:   [address] [place] [address] [phone]         Disposition: 01-Home or Self Care  Discharged Condition: stable  Consults:  none  Labs:   Results for orders placed during the hospital encounter of 06/22/12 (from the past 48 hour(s))  CBC WITH DIFFERENTIAL     Status: Abnormal   Collection Time   06/22/12  4:50 PM      Component Value Range Comment   WBC 10.6 (*) 4.0 - 10.5 K/uL  RBC 4.93  3.87 - 5.11 MIL/uL    Hemoglobin 14.4  12.0 - 15.0 g/dL    HCT 16.1  09.6 - 04.5 %    MCV 89.2  78.0 - 100.0 fL    MCH 29.2  26.0 - 34.0 pg    MCHC 32.7  30.0 - 36.0 g/dL    RDW 40.9  81.1 - 91.4 %    Platelets 257  150 - 400 K/uL    Neutrophils Relative 55  43 - 77 %    Neutro Abs 5.9  1.7 - 7.7 K/uL    Lymphocytes Relative 33  12 - 46 %    Lymphs Abs 3.5  0.7 - 4.0 K/uL    Monocytes Relative 7  3 - 12 %    Monocytes Absolute 0.8  0.1 - 1.0 K/uL    Eosinophils Relative 4  0 - 5 %    Eosinophils Absolute 0.4  0.0 - 0.7 K/uL    Basophils Relative 0  0 - 1 %    Basophils Absolute 0.0  0.0 - 0.1 K/uL   COMPREHENSIVE METABOLIC PANEL     Status: Abnormal   Collection Time   06/22/12  4:50 PM      Component Value Range Comment   Sodium 136  135 - 145 mEq/L    Potassium 4.4  3.5 - 5.1 mEq/L    Chloride 101  96 - 112 mEq/L    CO2 25  19 - 32 mEq/L    Glucose, Bld 235 (*) 70 - 99 mg/dL    BUN 16  6 - 23 mg/dL    Creatinine, Ser 7.82   0.50 - 1.10 mg/dL    Calcium 9.6  8.4 - 95.6 mg/dL    Total Protein 7.0  6.0 - 8.3 g/dL    Albumin 3.6  3.5 - 5.2 g/dL    AST 20  0 - 37 U/L SLIGHT HEMOLYSIS   ALT 15  0 - 35 U/L    Alkaline Phosphatase 78  39 - 117 U/L    Total Bilirubin 0.2 (*) 0.3 - 1.2 mg/dL    GFR calc non Af Amer 61 (*) >90 mL/min    GFR calc Af Amer 71 (*) >90 mL/min   APTT     Status: Normal   Collection Time   06/22/12  4:50 PM      Component Value Range Comment   aPTT 29  24 - 37 seconds   PROTIME-INR     Status: Normal   Collection Time   06/22/12  4:50 PM      Component Value Range Comment   Prothrombin Time 13.1  11.6 - 15.2 seconds    INR 0.97  0.00 - 1.49   TROPONIN I     Status: Normal   Collection Time   06/22/12  4:50 PM      Component Value Range Comment   Troponin I <0.30  <0.30 ng/mL   GLUCOSE, CAPILLARY     Status: Abnormal   Collection Time   06/22/12  5:32 PM      Component Value Range Comment   Glucose-Capillary 178 (*) 70 - 99 mg/dL   GLUCOSE, CAPILLARY     Status: Abnormal   Collection Time   06/22/12 10:22 PM      Component Value Range Comment   Glucose-Capillary 195 (*) 70 - 99 mg/dL    Comment 1 Notify RN     LIPID PANEL     Status:  Abnormal   Collection Time   06/23/12  5:20 AM      Component Value Range Comment   Cholesterol 184  0 - 200 mg/dL    Triglycerides 161 (*) <150 mg/dL    HDL 31 (*) >09 mg/dL    Total CHOL/HDL Ratio 5.9      VLDL 74 (*) 0 - 40 mg/dL    LDL Cholesterol 79  0 - 99 mg/dL   BASIC METABOLIC PANEL     Status: Abnormal   Collection Time   06/23/12  5:20 AM      Component Value Range Comment   Sodium 135  135 - 145 mEq/L    Potassium 3.6  3.5 - 5.1 mEq/L DELTA CHECK NOTED   Chloride 102  96 - 112 mEq/L    CO2 25  19 - 32 mEq/L    Glucose, Bld 191 (*) 70 - 99 mg/dL    BUN 14  6 - 23 mg/dL    Creatinine, Ser 6.04  0.50 - 1.10 mg/dL    Calcium 9.0  8.4 - 54.0 mg/dL    GFR calc non Af Amer 86 (*) >90 mL/min    GFR calc Af Amer >90  >90 mL/min   CBC      Status: Normal   Collection Time   06/23/12  5:20 AM      Component Value Range Comment   WBC 8.4  4.0 - 10.5 K/uL    RBC 4.85  3.87 - 5.11 MIL/uL    Hemoglobin 14.1  12.0 - 15.0 g/dL    HCT 98.1  19.1 - 47.8 %    MCV 88.9  78.0 - 100.0 fL    MCH 29.1  26.0 - 34.0 pg    MCHC 32.7  30.0 - 36.0 g/dL    RDW 29.5  62.1 - 30.8 %    Platelets 242  150 - 400 K/uL   GLUCOSE, CAPILLARY     Status: Abnormal   Collection Time   06/23/12  7:38 AM      Component Value Range Comment   Glucose-Capillary 168 (*) 70 - 99 mg/dL    Comment 1 Notify RN      Comment 2 Documented in Chart     GLUCOSE, CAPILLARY     Status: Abnormal   Collection Time   06/23/12 11:03 AM      Component Value Range Comment   Glucose-Capillary 299 (*) 70 - 99 mg/dL    Comment 1 Notify RN      Comment 2 Documented in Chart       Diagnostics:  Dg Hip Complete Left  06/11/2012  *RADIOLOGY REPORT*  Clinical Data: Left hip pain and tenderness for 1 month, no known injury  LEFT HIP - COMPLETE 2+ VIEW  Comparison: None  Findings: Osseous demineralization. Minimal narrowing of left hip joint. SI joints preserved. Pelvis appears intact. Few fracture, dislocation or bone destruction.  IMPRESSION: Minimal degenerative changes left hip joint. Osseous demineralization. No acute abnormalities.  Original Report Authenticated By: Lollie Marrow, M.D.   Ct Head Wo Contrast  06/22/2012  *RADIOLOGY REPORT*  Clinical Data: Acute left sided headache and weakness  CT HEAD WITHOUT CONTRAST  Technique:  Contiguous axial images were obtained from the base of the skull through the vertex without contrast.  Comparison: 01/29/2012  Findings: There is diffuse patchy low density throughout the subcortical and periventricular white matter consistent with chronic small vessel ischemic change.  There is prominence of the  sulci and ventricles consistent with brain atrophy.  There is no evidence for acute brain infarct, hemorrhage or mass.  Paranasal sinuses and  mastoid air cells are clear.  The skull is intact.  IMPRESSION:  1.  No acute intracranial abnormalities. 2.  Small vessel ischemic disease and brain atrophy.  Original Report Authenticated By: Rosealee Albee, M.D.   Mr [facility] Wo Contrast  06/23/2012  *RADIOLOGY REPORT*  Clinical Data:  Left sided headache and weakness.  MRI BRAIN WITHOUT CONTRAST MRA HEAD WITHOUT CONTRAST  Technique: Multiplanar, multiecho pulse sequences of the brain and surrounding structures were obtained according to standard protocol without intravenous contrast.  Angiographic images of the head were obtained using MRA technique without contrast.  Comparison: 06/22/2012 CT.  01/30/2012 MR.  MRI HEAD  Findings:  No acute infarct.  Prominent small vessel disease type changes.  Global atrophy without hydrocephalus.  No intracranial hemorrhage.  No intracranial mass lesion detected on this unenhanced exam.  Transverse ligament atrophy with mild spinal stenosis.  IMPRESSION: No acute infarct.  Prominent small vessel disease type changes.  Atrophy.  MRA HEAD  Findings: Moderate narrowing of the cavernous segment of the internal carotid artery bilaterally more notable on the left where there may be a focal high-grade stenosis.  Moderate narrowing supraclinoid aspect of the internal carotid artery more notable on the left.  Hypoplastic narrowed and irregular appearing A1 segment of the right anterior cerebral artery.  Fetal type origin of the right posterior cerebral artery with moderate to marked tandem stenosis.  Moderate to marked focal stenosis M1 segment right middle cerebral artery.  Moderate to marked stenosis right middle cerebral artery bifurcation.  Moderate to marked narrowing of the middle cerebral artery branch vessels bilaterally including proximal left middle cerebral artery branch vessel significant narrowing.  Tiny bulge anterior communicating artery region.  Tiny aneurysm (less than 1.5 mm) not excluded.  Mild irregularity and  narrowing of the vertebral arteries bilaterally.  Focal stenosis distal right vertebral artery may be present.  Poor delineation right PICA which may be superiorly located just prior to the formation of the basilar artery.  Moderate tandem stenosis left PICA.  Nonvisualization AICAs.  Mild narrowing and irregularity of the basilar artery most notable mid aspect.  Nonvisualization left superior cerebellar artery.  Moderate to marked stenosis involving portions of the posterior cerebral arteries bilaterally.  IMPRESSION: Prominent intracranial atherosclerotic type changes as detailed above.  Tiny bulge anterior communicating artery region.  Tiny aneurysm (less than 1.5 mm) not excluded.  Original Report Authenticated By: Fuller Canada, M.D.   Mri Brain Without Contrast  06/23/2012  *RADIOLOGY REPORT*  Clinical Data:  Left sided headache and weakness.  MRI BRAIN WITHOUT CONTRAST MRA HEAD WITHOUT CONTRAST  Technique: Multiplanar, multiecho pulse sequences of the brain and surrounding structures were obtained according to standard protocol without intravenous contrast.  Angiographic images of the head were obtained using MRA technique without contrast.  Comparison: 06/22/2012 CT.  01/30/2012 MR.  MRI HEAD  Findings:  No acute infarct.  Prominent small vessel disease type changes.  Global atrophy without hydrocephalus.  No intracranial hemorrhage.  No intracranial mass lesion detected on this unenhanced exam.  Transverse ligament atrophy with mild spinal stenosis.  IMPRESSION: No acute infarct.  Prominent small vessel disease type changes.  Atrophy.  MRA HEAD  Findings: Moderate narrowing of the cavernous segment of the internal carotid artery bilaterally more notable on the left where there may be a focal high-grade stenosis.  Moderate  narrowing supraclinoid aspect of the internal carotid artery more notable on the left.  Hypoplastic narrowed and irregular appearing A1 segment of the right anterior cerebral artery.   Fetal type origin of the right posterior cerebral artery with moderate to marked tandem stenosis.  Moderate to marked focal stenosis M1 segment right middle cerebral artery.  Moderate to marked stenosis right middle cerebral artery bifurcation.  Moderate to marked narrowing of the middle cerebral artery branch vessels bilaterally including proximal left middle cerebral artery branch vessel significant narrowing.  Tiny bulge anterior communicating artery region.  Tiny aneurysm (less than 1.5 mm) not excluded.  Mild irregularity and narrowing of the vertebral arteries bilaterally.  Focal stenosis distal right vertebral artery may be present.  Poor delineation right PICA which may be superiorly located just prior to the formation of the basilar artery.  Moderate tandem stenosis left PICA.  Nonvisualization AICAs.  Mild narrowing and irregularity of the basilar artery most notable mid aspect.  Nonvisualization left superior cerebellar artery.  Moderate to marked stenosis involving portions of the posterior cerebral arteries bilaterally.  IMPRESSION: Prominent intracranial atherosclerotic type changes as detailed above.  Tiny bulge anterior communicating artery region.  Tiny aneurysm (less than 1.5 mm) not excluded.  Original Report Authenticated By: Fuller Canada, M.D.   US Carotid Duplex Bilateral  06/23/2012  *RADIOLOGY REPORT*  Clinical Data: TIA symptoms, diabetes  BILATERAL CAROTID DUPLEX ULTRASOUND  Technique: Gray scale imaging, color Doppler and duplex ultrasound was performed of bilateral carotid and vertebral arteries in the neck.  Comparison:  None.  Criteria:  Quantification of carotid stenosis is based on velocity parameters that correlate the residual internal carotid diameter with NASCET-based stenosis levels, using the diameter of the distal internal carotid lumen as the denominator for stenosis measurement.  The following velocity measurements were obtained:                   PEAK SYSTOLIC/END  DIASTOLIC RIGHT ICA:                        102/13cm/sec CCA:                        108/13cm/sec SYSTOLIC ICA/CCA RATIO:     0.95 DIASTOLIC ICA/CCA RATIO:    1.04 ECA:                        162cm/sec  LEFT ICA:                        115/25cm/sec CCA:                        126/17cm/sec SYSTOLIC ICA/CCA RATIO:     0.91 DIASTOLIC ICA/CCA RATIO:    1.49 ECA:                        134cm/sec  Findings:  RIGHT CAROTID ARTERY: Minor atherosclerotic changes.  No hemodynamically significant ICA stenosis, velocity elevation, or turbulent flow.  RIGHT VERTEBRAL ARTERY:  Antegrade  LEFT CAROTID ARTERY: Minor similar atherosclerotic changes.  No hemodynamically significant left ICA stenosis, velocity elevation, or turbulent flow.  LEFT VERTEBRAL ARTERY:  Antegrade  IMPRESSION: Minor carotid atherosclerosis.  No hemodynamically significant ICA stenosis by ultrasound on either side  Original Report Authenticated By: Judie Petit. Ruel Favors, M.D.   Mm Digital Screening  06/10/2012  *RADIOLOGY REPORT*  Clinical Data: Screening.  DIGITAL SCREENING MAMMOGRAM WITH CAD  Comparison:  Previous exams  Findings:  There are scattered fibroglandular densities. No suspicious masses, architectural distortion, or calcifications are present.  Images were processed with CAD.  IMPRESSION: No specific mammographic evidence of malignancy.  A result letter of this screening mammogram will be mailed directly to the patient.  RECOMMENDATION: Screening mammogram in one year. (Code:SM-B-01Y)  BI-RADS CATEGORY 1:  Negative  Original Report Authenticated By: Patterson Hammersmith, M.D.   EKG: Sinus rhythm with occasional Premature ventricular complexes Left axis deviation Anterolateral infarct (cited on or before 30-Aug-2011)  Full Code   Hospital Course: See H&P for complete admission details. [name] is a 76 year old white female with multiple medical problems who presents with severe left-sided headache and reported left-sided weakness. She had  no evidence of weakness on neurologic exam. She has a previous history of stroke but forgets to take her aspirin frequently. CAT scan of the brain showed nothing acute. Teleneurologist was consulted and recommended observation and TIA workup. MRI showed no stroke. MRA did show diffuse atherosclerosis of the intracranial vasculature as above. She had no return of her weakness. Her headache improved. She was started back on aspirin. She also admits to noncompliance with CPAP and was counseled on this. Echocardiogram result is pending. She can followup with her primary care physician for results. Her other medical problems remain stable.  Discharge Exam:  Blood pressure 131/79, pulse 64, temperature 98.3 F (36.8 C), temperature source Oral, resp. rate 18, height 5' 4.5" (1.638 m), weight 96.798 kg (213 lb 6.4 oz), SpO2 93.00%.  General: Comfortable Lungs clear to auscultation bilaterally without wheeze rhonchi or rales Cardiovascular regular rate rhythm without murmurs gallops rubs Neurologic cranial nerves intact motor strength 5 out of 5. Gait normal.  Signed: Everlena Mackley L 06/23/2012, 3:06 PM

## 2012-06-23 NOTE — Progress Notes (Signed)
*  PRELIMINARY RESULTS* Echocardiogram 2D Echocardiogram has been performed.  [name] [name] 06/23/2012, 3:33 PM

## 2012-06-23 NOTE — Progress Notes (Signed)
Occupational Therapy Screen  Order received. Chart reviewed. Reviewed PT eval regarding patient's functional performance. Patient is ambulating independently and needs no assistance with ADL. Patient will be discharged home with husband. Met with patient and explained what occupational therapy is and ways it may help her. Patient did not express any difficulties at this time. OT services are not needed at this time.   Limmie Patricia, OTR/L

## 2012-06-23 NOTE — Progress Notes (Signed)
I attempted to put patient on CPAP machine with two different mask but patient wasn't able or refused to try to wear machine. Pt states that she only wears her machine half the time at night at home and I could have left this one where it was. I informed her that it was ordered by MD and recommended that she tries to wear. Nurse informed of the attempts and pt unable to tolerate.

## 2012-06-23 NOTE — Care Management Note (Unsigned)
    Page 1 of 1   06/23/2012     10:27:37 AM   CARE MANAGEMENT NOTE 06/23/2012  Patient:  [name], [name]   Account Number:  [id number]  Date Initiated:  06/23/2012  Documentation initiated by:  Rosemary Holms  Subjective/Objective Assessment:   Pt admitted with TIA symptoms. Lives at home with husband. Pt states she is very independent with ADLs.     Action/Plan:   Pt plans to return home. No HH needs identified.   Anticipated DC Date:  06/23/2012   Anticipated DC Plan:  HOME/SELF CARE      DC Planning Services  CM consult      Choice offered to / List presented to:             Status of service:  In process, will continue to follow Medicare Important Message given?   (If response is "NO", the following Medicare IM given date fields will be blank) Date Medicare IM given:   Date Additional Medicare IM given:    Discharge Disposition:    Per UR Regulation:    If discussed at Long Length of Stay Meetings, dates discussed:    Comments:  06/23/12 900 Shiela Bruns Leanord Hawking RN BSN CM

## 2012-06-23 NOTE — Progress Notes (Signed)
UR Chart Review Completed  

## 2012-07-16 ENCOUNTER — Inpatient Hospital Stay (HOSPITAL_COMMUNITY): Payer: Medicare Other

## 2012-07-16 ENCOUNTER — Inpatient Hospital Stay (HOSPITAL_COMMUNITY)
Admission: EM | Admit: 2012-07-16 | Discharge: 2012-07-20 | DRG: 064 | Disposition: A | Discharge status: Home / Self Care | Payer: Medicare Other | Attending: Family Medicine | Admitting: Family Medicine

## 2012-07-16 ENCOUNTER — Emergency Department (HOSPITAL_COMMUNITY): Payer: Medicare Other

## 2012-07-16 ENCOUNTER — Encounter (HOSPITAL_COMMUNITY): Payer: Self-pay

## 2012-07-16 DIAGNOSIS — R5383 Other fatigue: Secondary | ICD-10-CM | POA: Diagnosis present

## 2012-07-16 DIAGNOSIS — I679 Cerebrovascular disease, unspecified: Secondary | ICD-10-CM | POA: Diagnosis present

## 2012-07-16 DIAGNOSIS — R5381 Other malaise: Secondary | ICD-10-CM | POA: Diagnosis present

## 2012-07-16 DIAGNOSIS — E669 Obesity, unspecified: Secondary | ICD-10-CM | POA: Diagnosis present

## 2012-07-16 DIAGNOSIS — I8390 Asymptomatic varicose veins of unspecified lower extremity: Secondary | ICD-10-CM | POA: Diagnosis present

## 2012-07-16 DIAGNOSIS — G0490 Encephalitis and encephalomyelitis, unspecified: Secondary | ICD-10-CM | POA: Diagnosis present

## 2012-07-16 DIAGNOSIS — G4733 Obstructive sleep apnea (adult) (pediatric): Secondary | ICD-10-CM | POA: Diagnosis present

## 2012-07-16 DIAGNOSIS — Z79899 Other long term (current) drug therapy: Secondary | ICD-10-CM

## 2012-07-16 DIAGNOSIS — E860 Dehydration: Secondary | ICD-10-CM | POA: Diagnosis present

## 2012-07-16 DIAGNOSIS — Z951 Presence of aortocoronary bypass graft: Secondary | ICD-10-CM

## 2012-07-16 DIAGNOSIS — E119 Type 2 diabetes mellitus without complications: Secondary | ICD-10-CM | POA: Diagnosis present

## 2012-07-16 DIAGNOSIS — I6350 Cerebral infarction due to unspecified occlusion or stenosis of unspecified cerebral artery: Principal | ICD-10-CM | POA: Diagnosis present

## 2012-07-16 DIAGNOSIS — Z8249 Family history of ischemic heart disease and other diseases of the circulatory system: Secondary | ICD-10-CM

## 2012-07-16 DIAGNOSIS — I639 Cerebral infarction, unspecified: Secondary | ICD-10-CM

## 2012-07-16 DIAGNOSIS — J449 Chronic obstructive pulmonary disease, unspecified: Secondary | ICD-10-CM | POA: Diagnosis present

## 2012-07-16 DIAGNOSIS — E0590 Thyrotoxicosis, unspecified without thyrotoxic crisis or storm: Secondary | ICD-10-CM | POA: Diagnosis present

## 2012-07-16 DIAGNOSIS — I10 Essential (primary) hypertension: Secondary | ICD-10-CM | POA: Diagnosis present

## 2012-07-16 DIAGNOSIS — Z8673 Personal history of transient ischemic attack (TIA), and cerebral infarction without residual deficits: Secondary | ICD-10-CM

## 2012-07-16 DIAGNOSIS — J4489 Other specified chronic obstructive pulmonary disease: Secondary | ICD-10-CM | POA: Diagnosis present

## 2012-07-16 DIAGNOSIS — Z7982 Long term (current) use of aspirin: Secondary | ICD-10-CM

## 2012-07-16 DIAGNOSIS — Z800 Family history of malignant neoplasm of digestive organs: Secondary | ICD-10-CM

## 2012-07-16 DIAGNOSIS — G609 Hereditary and idiopathic neuropathy, unspecified: Secondary | ICD-10-CM | POA: Diagnosis present

## 2012-07-16 DIAGNOSIS — Z794 Long term (current) use of insulin: Secondary | ICD-10-CM

## 2012-07-16 DIAGNOSIS — I2510 Atherosclerotic heart disease of native coronary artery without angina pectoris: Secondary | ICD-10-CM | POA: Diagnosis present

## 2012-07-16 LAB — BASIC METABOLIC PANEL
BUN: 12 mg/dL (ref 6–23)
CO2: 27 mEq/L (ref 19–32)
Calcium: 10 mg/dL (ref 8.4–10.5)
Chloride: 100 mEq/L (ref 96–112)
Creatinine, Ser: 0.57 mg/dL (ref 0.50–1.10)
GFR calc Af Amer: >90 mL/min (ref >90)

## 2012-07-16 LAB — GLUCOSE, CAPILLARY
Glucose-Capillary: 157 mg/dL — ABNORMAL HIGH (ref 70–99)
Glucose-Capillary: 190 mg/dL — ABNORMAL HIGH (ref 70–99)

## 2012-07-16 LAB — CBC
HCT: 45 % (ref 36.0–46.0)
MCH: 29.5 pg (ref 26.0–34.0)
MCV: 87.9 fL (ref 78.0–100.0)
Platelets: 227 10*3/uL (ref 150–400)
RDW: 13.2 % (ref 11.5–15.5)
WBC: 9.9 10*3/uL (ref 4.0–10.5)

## 2012-07-16 MED ORDER — LISINOPRIL 10 MG PO TABS
20.0000 mg | ORAL_TABLET | Freq: Every day | ORAL | Status: DC
  Administered 2012-07-16 – 2012-07-20 (×5): 20 mg via ORAL
  Filled 2012-07-16 (×5): qty 2

## 2012-07-16 MED ORDER — ONDANSETRON HCL 4 MG/2ML IJ SOLN
4.0000 mg | Freq: Four times a day (QID) | INTRAMUSCULAR | Status: DC | PRN
  Administered 2012-07-17 – 2012-07-19 (×4): 4 mg via INTRAVENOUS
  Filled 2012-07-16 (×5): qty 2

## 2012-07-16 MED ORDER — ZOLPIDEM TARTRATE 5 MG PO TABS
5.0000 mg | ORAL_TABLET | Freq: Every evening | ORAL | Status: DC | PRN
  Administered 2012-07-16 – 2012-07-19 (×2): 5 mg via ORAL
  Filled 2012-07-16 (×2): qty 1

## 2012-07-16 MED ORDER — MORPHINE SULFATE 2 MG/ML IJ SOLN
2.0000 mg | Freq: Once | INTRAMUSCULAR | Status: AC
  Administered 2012-07-16: 2 mg via INTRAVENOUS
  Filled 2012-07-16: qty 1

## 2012-07-16 MED ORDER — LEVOTHYROXINE SODIUM 25 MCG PO TABS
25.0000 ug | ORAL_TABLET | ORAL | Status: DC
  Administered 2012-07-17 – 2012-07-20 (×4): 25 ug via ORAL
  Filled 2012-07-16 (×4): qty 1

## 2012-07-16 MED ORDER — INSULIN ASPART 100 UNIT/ML ~~LOC~~ SOLN
0.0000 [IU] | Freq: Every day | SUBCUTANEOUS | Status: DC
  Administered 2012-07-17 – 2012-07-18 (×2): 2 [IU] via SUBCUTANEOUS

## 2012-07-16 MED ORDER — INSULIN ASPART 100 UNIT/ML ~~LOC~~ SOLN
0.0000 [IU] | Freq: Three times a day (TID) | SUBCUTANEOUS | Status: DC
  Administered 2012-07-16: 3 [IU] via SUBCUTANEOUS
  Administered 2012-07-17: 5 [IU] via SUBCUTANEOUS
  Administered 2012-07-17 – 2012-07-18 (×3): 3 [IU] via SUBCUTANEOUS
  Administered 2012-07-18: 2 [IU] via SUBCUTANEOUS
  Administered 2012-07-18: 3 [IU] via SUBCUTANEOUS
  Administered 2012-07-19: 2 [IU] via SUBCUTANEOUS
  Administered 2012-07-19: 5 [IU] via SUBCUTANEOUS
  Administered 2012-07-20: 3 [IU] via SUBCUTANEOUS

## 2012-07-16 MED ORDER — METFORMIN HCL 500 MG PO TABS
500.0000 mg | ORAL_TABLET | Freq: Two times a day (BID) | ORAL | Status: DC
  Administered 2012-07-16 – 2012-07-20 (×8): 500 mg via ORAL
  Filled 2012-07-16 (×8): qty 1

## 2012-07-16 MED ORDER — ONDANSETRON HCL 4 MG/2ML IJ SOLN
INTRAMUSCULAR | Status: AC
  Administered 2012-07-16: 4 mg via INTRAVENOUS
  Filled 2012-07-16: qty 2

## 2012-07-16 MED ORDER — SIMVASTATIN 20 MG PO TABS
20.0000 mg | ORAL_TABLET | Freq: Every day | ORAL | Status: DC
  Administered 2012-07-16 – 2012-07-19 (×4): 20 mg via ORAL
  Filled 2012-07-16 (×4): qty 1

## 2012-07-16 MED ORDER — INSULIN ASPART 100 UNIT/ML ~~LOC~~ SOLN: 0.0000 [IU] | Freq: Three times a day (TID) | SUBCUTANEOUS | Status: DC

## 2012-07-16 MED ORDER — ASPIRIN-DIPYRIDAMOLE ER 25-200 MG PO CP12
1.0000 | ORAL_CAPSULE | Freq: Every day | ORAL | Status: AC
  Administered 2012-07-16 – 2012-07-20 (×5): 1 via ORAL
  Filled 2012-07-16 (×5): qty 1

## 2012-07-16 MED ORDER — SODIUM CHLORIDE 0.9 % IV SOLN
INTRAVENOUS | Status: DC
  Administered 2012-07-16 (×3): via INTRAVENOUS

## 2012-07-16 MED ORDER — ENOXAPARIN SODIUM 40 MG/0.4ML ~~LOC~~ SOLN
40.0000 mg | SUBCUTANEOUS | Status: DC
  Administered 2012-07-16 – 2012-07-19 (×4): 40 mg via SUBCUTANEOUS
  Filled 2012-07-16 (×4): qty 0.4

## 2012-07-16 MED ORDER — NITROGLYCERIN 0.4 MG SL SUBL: 0.4000 mg | SUBLINGUAL_TABLET | SUBLINGUAL | Status: DC | PRN

## 2012-07-16 MED ORDER — INSULIN GLARGINE 100 UNIT/ML ~~LOC~~ SOLN
60.0000 [IU] | Freq: Every day | SUBCUTANEOUS | Status: DC
  Administered 2012-07-16 – 2012-07-19 (×4): 60 [IU] via SUBCUTANEOUS

## 2012-07-16 MED ORDER — ONDANSETRON HCL 4 MG/2ML IJ SOLN
4.0000 mg | Freq: Once | INTRAMUSCULAR | Status: AC
  Administered 2012-07-16: 4 mg via INTRAVENOUS

## 2012-07-16 MED ORDER — LISINOPRIL-HYDROCHLOROTHIAZIDE 20-12.5 MG PO TABS: 1.0000 | ORAL_TABLET | Freq: Every day | ORAL | Status: DC

## 2012-07-16 MED ORDER — LORAZEPAM 0.5 MG PO TABS
0.5000 mg | ORAL_TABLET | ORAL | Status: DC | PRN
  Administered 2012-07-16 – 2012-07-19 (×9): 0.5 mg via ORAL
  Filled 2012-07-16 (×10): qty 1

## 2012-07-16 MED ORDER — HYDROCODONE-ACETAMINOPHEN 5-325 MG PO TABS
1.0000 | ORAL_TABLET | ORAL | Status: DC | PRN
  Administered 2012-07-16 – 2012-07-20 (×6): 1 via ORAL
  Filled 2012-07-16 (×7): qty 1

## 2012-07-16 MED ORDER — ASPIRIN-DIPYRIDAMOLE ER 25-200 MG PO CP12
ORAL_CAPSULE | ORAL | Status: AC
  Filled 2012-07-16: qty 1

## 2012-07-16 MED ORDER — SODIUM CHLORIDE 0.9 % IV BOLUS (SEPSIS)
500.0000 mL | Freq: Once | INTRAVENOUS | Status: AC
  Administered 2012-07-16: 500 mL via INTRAVENOUS

## 2012-07-16 MED ORDER — AMLODIPINE BESYLATE 5 MG PO TABS
5.0000 mg | ORAL_TABLET | ORAL | Status: DC
  Administered 2012-07-17 – 2012-07-20 (×4): 5 mg via ORAL
  Filled 2012-07-16 (×4): qty 1

## 2012-07-16 MED ORDER — SODIUM CHLORIDE 0.9 % IV SOLN
INTRAVENOUS | Status: DC
  Administered 2012-07-16 – 2012-07-19 (×6): via INTRAVENOUS

## 2012-07-16 MED ORDER — HYDROCHLOROTHIAZIDE 12.5 MG PO CAPS
12.5000 mg | ORAL_CAPSULE | Freq: Every day | ORAL | Status: DC
  Administered 2012-07-16 – 2012-07-20 (×5): 12.5 mg via ORAL
  Filled 2012-07-16 (×5): qty 1

## 2012-07-16 NOTE — ED Notes (Signed)
Pt states she got up about 0930 this morning. States she was drinking coffee around 1000 when she noticed the left side of her face drooping. Left eye and left side of mouth. No change in speech pattern, grips or ability to walk

## 2012-07-16 NOTE — ED Notes (Signed)
Pt became nauseated after receiving Morphine.  meds given.

## 2012-07-16 NOTE — ED Provider Notes (Signed)
History     CSN: [id number]  Arrival date & time 07/16/12  1202   First MD Initiated Contact with Patient 07/16/12 1224      Chief Complaint  Patient presents with  . Facial Droop    (Consider location/radiation/quality/duration/timing/severity/associated sxs/prior treatment) HPI.... Facial droop, left arm and left leg weakness, ataxia since 0800 this morning. Speech and mentation appear to be normal. Risk factors include hypertension, diabetes, previous CABG in 2001. Level V caveat for urgent need for intervention.    nothing makes symptoms better or worse. Severity is moderate to severe  Past Medical History  Diagnosis Date  . Arteriosclerotic cardiovascular disease (ASCVD)      CABG surgery in 2001; nl stress nuclear-2007; angiography in 01/2006- TO of LAD; 70% ostial stenosis      of a small ramus; patent grafts; normal EF; cath in 12/2009 essentially unchanged; EF-66%;  Congestive heart failure with preserved LV systolic function  . Hypertension   . Obstructive sleep apnea   . Cerebrovascular disease     H/o CVA  . Chronic back pain   . Vestibular neuritis     ataxia  . Varicose veins   . Diabetes mellitus     Requires insulin  . Acute renal failure     Possibly contrast-induced  . Obesity   . Chronic obstructive pulmonary disease     Chronic bronchitis  . Fall 2009    right humeral fracture  . Peripheral neuropathy     Left lower extremity; possibly radiculopathy  . Back pain, chronic   . Hyperthyroidism     Past Surgical History  Procedure Date  . Coronary artery bypass graft 6/01    SVG to diagonal, LIMA to LAD  . Rotator cuff repair   . Appendectomy   . Cholecystectomy   . Total abdominal hysterectomy   . Lumbar spine surgery     X2  . Knee surgery     Arthroscopic  . Carpal tunnel release     Bilateral  . Orif humerus fracture 2009    Family History  Problem Relation Age of Onset  . Coronary artery disease      Multiple first and second-degree  relatives  . Aneurysm      Cerebral circulation  . Hypertension Mother   . Colon cancer Sister   . Clotting disorder      Children diagnosed with hypercoagulable state    History  Substance Use Topics  . Smoking status: Never Smoker   . Smokeless tobacco: Never Used  . Alcohol Use: No    OB History    Grav Para Term Preterm Abortions TAB SAB Ect Mult Living                  Review of Systems  Unable to perform ROS   Allergies  Codeine  Home Medications   Current Outpatient Rx  Name Route Sig Dispense Refill  . AMLODIPINE BESYLATE 5 MG PO TABS Oral Take 5 mg by mouth every morning.    . ASPIRIN 325 MG PO TABS Oral Take 1 tablet (325 mg total) by mouth daily.    [name] HYDROCODONE-ACETAMINOPHEN 10-500 MG PO TABS Oral Take 0.5-1 tablets by mouth Once daily as needed. For pain    . INSULIN GLARGINE 100 UNIT/ML [place] SOLN Subcutaneous Inject 60 Units into the skin at bedtime.      . INSULIN GLULISINE 100 UNIT/ML [place] SOLN Subcutaneous Inject 20 Units into the skin 3 (three) times daily before  meals.     [name] LEVOTHYROXINE SODIUM 25 MCG PO TABS Oral Take 25 mcg by mouth every morning.     [name] LISINOPRIL-HYDROCHLOROTHIAZIDE 20-12.5 MG PO TABS Oral Take 1 tablet by mouth every morning.    [name] LORAZEPAM 0.5 MG PO TABS Oral Take 0.5 mg by mouth every 4 (four) hours as needed. nerves    . METFORMIN HCL 500 MG PO TABS Oral Take 500 mg by mouth 2 (two) times daily with a meal.      . NITROGLYCERIN 0.4 MG SL SUBL Sublingual Place 0.4 mg under the tongue every 5 (five) minutes as needed.      [name] PRAVASTATIN SODIUM 80 MG PO TABS Oral Take 80 mg by mouth every evening.    . TRIAMCINOLONE ACETONIDE 0.1 % EX CREA Topical Apply topically 2 (two) times daily. 30 g 3  . TRIMETHOPRIM 100 MG PO TABS Oral Take 100 mg by mouth at bedtime.      BP 163/127  Pulse 72  Temp 98.2 F (36.8 C) (Oral)  Resp 20  Ht 5\' 10"  (1.778 m)  Wt 210 lb (95.255 kg)  BMI 30.13 kg/m2  SpO2 96%  Physical Exam  Nursing note  and vitals reviewed. Constitutional: She is oriented to person, place, and time. She appears well-developed and well-nourished.  HENT:  Head: Atraumatic.       Left facial droop  Eyes: Conjunctivae and EOM are normal. Pupils are equal, round, and reactive to light.  Neck: Normal range of motion. Neck supple.  Cardiovascular: Normal rate and regular rhythm.   Pulmonary/Chest: Effort normal and breath sounds normal.  Abdominal: Soft. Bowel sounds are normal.  Musculoskeletal: Normal range of motion.  Neurological: She is alert and oriented to person, place, and time.       Left arm and left leg are mobile but weaker than right side  Skin: Skin is warm and dry.  Psychiatric: She has a normal mood and affect.    ED Course  Procedures (including critical care time)  Labs Reviewed  CBC - Abnormal; Notable for the following:    RBC 5.12 (*)     Hemoglobin 15.1 (*)     All other components within normal limits  BASIC METABOLIC PANEL - Abnormal; Notable for the following:    Glucose, Bld 185 (*)     GFR calc non Af Amer 90 (*)     All other components within normal limits  GLUCOSE, CAPILLARY - Abnormal; Notable for the following:    Glucose-Capillary 189 (*)     All other components within normal limits   Ct Head Wo Contrast  07/16/2012  *RADIOLOGY REPORT*  Clinical Data: Left facial droop, code stroke headache, history of stroke, hypertension, diabetes  CT HEAD WITHOUT CONTRAST  Technique:  Contiguous axial images were obtained from the base of the skull through the vertex without contrast.  Comparison: 06/22/2012  Findings: Generalized atrophy. Normal ventricular morphology. No midline shift or mass effect. Small vessel chronic ischemic changes of deep cerebral white matter. No intracranial hemorrhage, mass lesion, or acute infarction. Scattered mucosal thickening ethmoid air cells particularly right. Visualized paranasal sinuses and mastoid air cells otherwise clear. Bones unremarkable.   IMPRESSION: Atrophy with small vessel chronic ischemic changes of deep cerebral white matter. No acute intracranial abnormalities.  Original Report Authenticated By: Lollie Marrow, M.D.     No diagnosis found.   Date: 07/16/2012  Rate: 70  Rhythm: normal sinus rhythm  QRS Axis: left  Intervals:  normal  ST/T Wave abnormalities: normal  Conduction Disutrbances:none  Narrative Interpretation:   Old EKG Reviewed: changes noted CRITICAL CARE Performed by: Donnetta Hutching   Total critical care time: 40  Critical care time was exclusive of separately billable procedures and treating other patients.  Critical care was necessary to treat or prevent imminent or life-threatening deterioration.  Critical care was time spent personally by me on the following activities: development of treatment plan with patient and/or surrogate as well as nursing, discussions with consultants, evaluation of patient's response to treatment, examination of patient, obtaining history from patient or surrogate, ordering and performing treatments and interventions, ordering and review of laboratory studies, ordering and review of radiographic studies, pulse oximetry and re-evaluation of patient's condition.  MDM  Discussion with the neurologist on-call at [facility], tele neurologist, and primary care physician.  History and physical suggest a brainstem stroke. She is not a candidate for thrombolytics secondary to time window greater than 6 hours.        Donnetta Hutching, MD 07/16/12 1416

## 2012-07-16 NOTE — ED Notes (Signed)
Pt has mild grip weakness on left side with mild weakness to left lower leg.  Speech clear at this time, alert and oriented x 4, although pt unable to remember why she was last admitted to the hospital.

## 2012-07-16 NOTE — Consult Note (Signed)
Reason for Consult: Referring Physician:   LAQUINTA [name] is an 76 y.o. female.  HPI:   Past Medical History  Diagnosis Date  . Arteriosclerotic cardiovascular disease (ASCVD)      CABG surgery in 2001; nl stress nuclear-2007; angiography in 01/2006- TO of LAD; 70% ostial stenosis      of a small ramus; patent grafts; normal EF; cath in 12/2009 essentially unchanged; EF-66%;  Congestive heart failure with preserved LV systolic function  . Hypertension   . Obstructive sleep apnea   . Cerebrovascular disease     H/o CVA  . Chronic back pain   . Vestibular neuritis     ataxia  . Varicose veins   . Diabetes mellitus     Requires insulin  . Acute renal failure     Possibly contrast-induced  . Obesity   . Chronic obstructive pulmonary disease     Chronic bronchitis  . Fall 2009    right humeral fracture  . Peripheral neuropathy     Left lower extremity; possibly radiculopathy  . Back pain, chronic   . Hyperthyroidism     Past Surgical History  Procedure Date  . Coronary artery bypass graft 6/01    SVG to diagonal, LIMA to LAD  . Rotator cuff repair   . Appendectomy   . Cholecystectomy   . Total abdominal hysterectomy   . Lumbar spine surgery     X2  . Knee surgery     Arthroscopic  . Carpal tunnel release     Bilateral  . Orif humerus fracture 2009    Family History  Problem Relation Age of Onset  . Coronary artery disease      Multiple first and second-degree relatives  . Aneurysm      Cerebral circulation  . Hypertension Mother   . Colon cancer Sister   . Clotting disorder      Children diagnosed with hypercoagulable state    Social History:  reports that she has never smoked. She has never used smokeless tobacco. She reports that she does not drink alcohol or use illicit drugs.  Allergies:  Allergies  Allergen Reactions  . Codeine Itching    Medications:  Prior to Admission medications   Medication Sig Start Date End Date Taking? Authorizing  Provider  amLODipine (NORVASC) 5 MG tablet Take 5 mg by mouth every morning. 01/07/12 01/06/13 Yes Kathlen Brunswick, MD  aspirin 325 MG tablet Take 1 tablet (325 mg total) by mouth daily. 06/23/12 06/23/13 Yes Christiane Ha, MD  HYDROcodone-acetaminophen (LORTAB) 10-500 MG per tablet Take 0.5-1 tablets by mouth Once daily as needed. For pain 12/26/11  Yes Historical Provider, MD  insulin glargine (LANTUS SOLOSTAR) 100 UNIT/ML injection Inject 60 Units into the skin at bedtime.     Yes Historical Provider, MD  insulin glulisine (APIDRA) 100 UNIT/ML injection Inject 20 Units into the skin 3 (three) times daily before meals.    Yes Historical Provider, MD  levothyroxine (SYNTHROID, LEVOTHROID) 25 MCG tablet Take 25 mcg by mouth every morning.    Yes Historical Provider, MD  lisinopril-hydrochlorothiazide (PRINZIDE,ZESTORETIC) 20-12.5 MG per tablet Take 1 tablet by mouth every morning. 01/07/12 01/06/13 Yes Kathlen Brunswick, MD  LORazepam (ATIVAN) 0.5 MG tablet Take 0.5 mg by mouth every 4 (four) hours as needed. nerves   Yes Historical Provider, MD  metFORMIN (GLUCOPHAGE) 500 MG tablet Take 500 mg by mouth 2 (two) times daily with a meal.     Yes Historical Provider, MD  naproxen sodium (ALEVE) 220 MG tablet Take 220 mg by mouth 2 (two) times daily as needed.   Yes Historical Provider, MD  nitroGLYCERIN (NITROSTAT) 0.4 MG SL tablet Place 0.4 mg under the tongue every 5 (five) minutes as needed.     Yes Historical Provider, MD  pravastatin (PRAVACHOL) 80 MG tablet Take 80 mg by mouth every evening. 01/07/12 01/06/13 Yes Kathlen Brunswick, MD  triamcinolone cream (KENALOG) 0.1 % Apply topically 2 (two) times daily. 01/07/12 01/06/13 Yes Kathlen Brunswick, MD  trimethoprim (TRIMPEX) 100 MG tablet Take 100 mg by mouth at bedtime.   Yes Historical Provider, MD     Results for orders placed during the hospital encounter of 07/16/12 (from the past 48 hour(s))  CBC     Status: Abnormal   Collection Time   07/16/12  12:41 PM      Component Value Range Comment   WBC 9.9  4.0 - 10.5 K/uL    RBC 5.12 (*) 3.87 - 5.11 MIL/uL    Hemoglobin 15.1 (*) 12.0 - 15.0 g/dL    HCT 40.9  81.1 - 91.4 %    MCV 87.9  78.0 - 100.0 fL    MCH 29.5  26.0 - 34.0 pg    MCHC 33.6  30.0 - 36.0 g/dL    RDW 78.2  95.6 - 21.3 %    Platelets 227  150 - 400 K/uL   BASIC METABOLIC PANEL     Status: Abnormal   Collection Time   07/16/12 12:41 PM      Component Value Range Comment   Sodium 136  135 - 145 mEq/L    Potassium 4.0  3.5 - 5.1 mEq/L    Chloride 100  96 - 112 mEq/L    CO2 27  19 - 32 mEq/L    Glucose, Bld 185 (*) 70 - 99 mg/dL    BUN 12  6 - 23 mg/dL    Creatinine, Ser 0.86  0.50 - 1.10 mg/dL    Calcium 57.8  8.4 - 10.5 mg/dL    GFR calc non Af Amer 90 (*) >90 mL/min    GFR calc Af Amer >90  >90 mL/min   GLUCOSE, CAPILLARY     Status: Abnormal   Collection Time   07/16/12  1:03 PM      Component Value Range Comment   Glucose-Capillary 189 (*) 70 - 99 mg/dL   GLUCOSE, CAPILLARY     Status: Abnormal   Collection Time   07/16/12  5:10 PM      Component Value Range Comment   Glucose-Capillary 157 (*) 70 - 99 mg/dL    Comment 1 Notify RN       Ct Head Wo Contrast  07/16/2012  *RADIOLOGY REPORT*  Clinical Data: Left facial droop, code stroke headache, history of stroke, hypertension, diabetes  CT HEAD WITHOUT CONTRAST  Technique:  Contiguous axial images were obtained from the base of the skull through the vertex without contrast.  Comparison: 06/22/2012  Findings: Generalized atrophy. Normal ventricular morphology. No midline shift or mass effect. Small vessel chronic ischemic changes of deep cerebral white matter. No intracranial hemorrhage, mass lesion, or acute infarction. Scattered mucosal thickening ethmoid air cells particularly right. Visualized paranasal sinuses and mastoid air cells otherwise clear. Bones unremarkable.  IMPRESSION: Atrophy with small vessel chronic ischemic changes of deep cerebral white matter.  No acute intracranial abnormalities.  Original Report Authenticated By: Lollie Marrow, M.D.    Review of Systems  Respiratory: Positive for cough.   Cardiovascular: Negative.   Gastrointestinal: Negative.   Genitourinary: Negative.   Musculoskeletal: Positive for myalgias.  Neurological: Positive for weakness.  Endo/Heme/Allergies: Negative.   Psychiatric/Behavioral: Positive for memory loss.   Blood pressure 112/61, pulse 56, temperature 98 F (36.7 C), temperature source Oral, resp. rate 18, height 5\' 10"  (1.778 m), weight 95.2 kg (209 lb 14.1 oz), SpO2 92.00%. Physical Exam  Assessment/Plan: See dict  Jabir Dahlem 07/16/2012, 9:09 PM

## 2012-07-16 NOTE — ED Notes (Signed)
teleneurology being completed at this time.

## 2012-07-16 NOTE — Consult Note (Signed)
NAME:  [name] [name], [name] [name]              ACCOUNT NO.:  [id number]  MEDICAL RECORD NO.:  [id number]  LOCATION:  A316                          FACILITY:  [facility]  PHYSICIAN:  Torrez Renfroe A. Gerilyn Pilgrim, M.D. DATE OF BIRTH:  [birth year]  DATE OF CONSULTATION: DATE OF DISCHARGE:                                CONSULTATION   HISTORY OF PRESENT ILLNESS:  The patient is a 76 year old white female who has had a stroke about 10 years ago where she was left with left- sided hemiparesthesia.  She actually recovered it very well and is back to baseline.  It appears that she had an MRI done on the first of this month because she just was not feeling well.  She reports that she has had problems with her memory and gait problems.  These were the primary reasons why she had the MRI.  MRI that is why reviewed in person and shows nothing acute.  She also had MRA.  MRA I believe was also reviewed and shows some intracranial occlusive disease, moderate.  The patient presents today with facial weakness.  She woke up this morning with slow and unsteady over gait.  Again, she has had some gait unsteadiness, but she went to drink coffee and notes that the coffee ran all through her mouth on the left side.  She subsequently realized that she had left facial weakness.  She also reports having weakness of the left upper extremity and left lower extremity.  She reports that she has had some problems talking as a recent issue that is going probably for weeks and not acute as her recent symptoms.  She reported that she has had significant headaches recently.  She does have a history of sleep apnea.  PHYSICAL EXAMINATION:  GENERAL:  Shows a morbidly obese, pleasant lady, in no acute distress. HEENT EVALUATION:  Shows neck is supple.  Head is normocephalic, atraumatic.  She has a large tongue and neck with carotid and posterior air space mentation. ABDOMEN:  Soft. EXTREMITIES:  No significant edema. NEURO: She is awake and alert.   Speech, language, and cognition are essentially unremarkable.  She does have a little bit of dysarthria. Cranial evaluation shows that she has weakness of the left lower facial muscles.  This is moderate, actually has flattening of the nasolabial fold on the right.  Tongue is midline.  Uvula midline.  Upper facial muscles are normal.  Pupils are round reactive to light.  Visual fields are intact.  Extraocular movements are full.  Motor examination shows bilateral upper extremity weakness is graded as 4-/5 proximally, distal strength is 5.  Tone and bulk were normal.  Lower extremities on the left side, proximal strength is 5 and left 4+.  Distal strengths were both normal.  Bulk and tone are normal in the legs.  Reflexes are slightly diminished in the legs.  Plantars are interestingly extensor on the right and flexor on the left.  Coordination shows no dysmetria.  No tremors.  She has no pronator drift.  IMAGING:  Again, I did review the patient's recent MRI done tonight and I see nothing acute on diffusion imaging.  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 There is also significant atrophy, mild to moderate.  She does have old lacunar infarcts involving the right thalamus, right globus pallidus, and also the questionable right putamen.  MRA from earlier this month shows bilateral intracranial MCA stenosis.  There is a dropout signal in distal MCA bilaterally.  IMPRESSION: 1. Acute focal weakness, left side.  Imaging does not show acute     infarct.  The patient may actually have recurrence of her old     stroke symptoms in the setting of hyperglycemia, dehydration, and     possible UTI.  Urinalysis is pending. 2. Severe leukoencephalopathy, likely causing the patient's gait     instability and memory problems.  She is at a high risk of     developing vascular dementia.  RECOMMENDATION:  We think we can switch her on  antiplatelet agents from aspirin to Aggrenox.  We will start the Aggrenox as once a day for about 5 days and then go to b.i.d. dosing.  In the meantime, we will continue with the current dose of aspirin.  Continue with statin medications. Also, consider EEG.  Thanks for this consultation     Zaylyn Bergdoll A. Gerilyn Pilgrim, M.D.     KAD/MEDQ  D:  07/16/2012  T:  07/16/2012  Job:  [id number]

## 2012-07-16 NOTE — ED Notes (Signed)
Pt unable to tolerate lying flat.

## 2012-07-16 NOTE — H&P (Signed)
NAME:  [name] [name], WAGONER              ACCOUNT NO.:  [id number]  MEDICAL RECORD NO.:  [id number]  LOCATION:  A316                          FACILITY:  [facility]  PHYSICIAN:  Jackeline Gutknecht G. Renard Matter, MD   DATE OF BIRTH:  1936/06/24  DATE OF ADMISSION:  07/16/2012 DATE OF DISCHARGE:  LH                             HISTORY & PHYSICAL   This patient first presented to the office with acute onset of left facial weakness.  She had slight weakness in left arm and left leg.  The patient was sent to the emergency room with the suspicion that this was either Bell palsy or acute onset of CVA.  The patient was seen in the emergency department by emergency room physician who also noted that she had some left arm and left leg weakness and ataxia and also noted the severe left facial weakness.  The patient does have a history of poorly- controlled diabetes, hypertension, and CABG in 2001.  The emergency room physician did order a head CT without contrast.  The impression was atrophy with small vessel chronic ischemic changes and deep cerebral white matter, no acute intracranial abnormalities.  Following this, he did discuss this with [facility] and was felt that she should be admitted, but did not feel that she was a candidate for tPA.  She was admitted to a med/surg bed for further evaluation and neurology consult.  SOCIAL HISTORY:  The patient does not smoke or drink alcohol.  PAST MEDICAL HISTORY:  Coronary artery disease, hypertension, sleep apnea, cerebrovascular disease, chronic back pain, diabetes mellitus insulin dependent, history of acute renal failure possibly contrast induced, chronic obesity, chronic obstructive pulmonary disease, peripheral neuropathy, hypothyroidism.  PAST SURGICAL HISTORY:  Coronary bypass surgery, LIMA and SVG, rotator cuff repair, appendectomy, cholecystectomy, total abdominal hysterectomy, lumbar spine surgery, knee surgery, carpal tunnel release, humerus  fracture.  FAMILY HISTORY:  Positive for coronary artery disease, cerebral aneurysm, hypertension, colon cancer, clotting disorders.  ALLERGIES:  CODEINE.  REVIEW OF SYSTEMS:  HEENT:  The patient has had headache and left facial weakness.  CARDIOPULMONARY:  No cough, hemoptysis, or dyspnea.  GI:  No bowel irregularity or bleeding.  GU:  No dysuria, hematuria.  MEDICATION LIST:  Amlodipine 5 mg daily, aspirin 325 mg daily, hydrocodone 0.5 to 1 tablet 10/500 mg every 4 hours as needed for pain, insulin glargine 60 units daily at bedtime, subcutaneous Apidra 20 units t.i.d. before meals, levothyroxine 25 mcg daily, lisinopril and hydrochlorothiazide 1 tablet daily at 20/12.5 mg, Lorazepam 0.5 mg every 4 hours as needed, metformin hydrochloride 500 mg b.i.d., nitroglycerin 0.4 mg sublingual every 5 minutes as needed, pravastatin sodium 80 mg daily, triamcinolone 0.1% apply topically twice a day, and trimethoprim 100 mg at bedtime.  PHYSICAL EXAMINATION:  GENERAL:  Alert, but uncomfortable white female.  VITAL SIGNS:  Blood pressure 122/65, respirations 18, pulse 62, temp 98.  HEENT:  Eyes, PERRLA.  TMs negative.  Oropharynx benign.  The patient has severe weakness left side of the face.  NECK:  Supple.  No JVD or thyroid abnormalities.  No carotid bruits.  HEART:  Regular rhythm.  No murmurs.  LUNGS:  Clear to P and A.  ABDOMEN:  No palpable organs or masses.  No organomegaly.  EXTREMITIES:  Free of edema.  NEUROLOGIC:  Slight muscle weakness in left arm and left leg.  The patient has severe weakness of left side of the face.  Cranial nerves intact with exception of left facial weakness.  No other pathological reflexes.  ASSESSMENT:  The patient does have acute left facial weakness, left- sided weakness, is admitted to rule out cerebrovascular accident.  She could have Bell palsy.  She does have other medical problems, insulin- dependent diabetes, history of chronic  obstructive pulmonary disease, history of coronary artery disease, hypertension.  PLAN:  To proceed with workup for acute CVA.  We will obtain Neurology consult.  In all likelihood have to have an MRI of brain, carotid Doppler ultrasound, 2D echo.     [name], MD     AGM/MEDQ  D:  07/16/2012  T:  07/16/2012  Job:  [id number]

## 2012-07-17 LAB — COMPREHENSIVE METABOLIC PANEL
AST: 27 U/L (ref 0–37)
Albumin: 2.9 g/dL — ABNORMAL LOW (ref 3.5–5.2)
Chloride: 106 mEq/L (ref 96–112)
Creatinine, Ser: 0.66 mg/dL (ref 0.50–1.10)
Total Bilirubin: 0.2 mg/dL — ABNORMAL LOW (ref 0.3–1.2)
Total Protein: 5.7 g/dL — ABNORMAL LOW (ref 6.0–8.3)

## 2012-07-17 LAB — URINALYSIS, ROUTINE W REFLEX MICROSCOPIC
Bilirubin Urine: NEGATIVE
Ketones, ur: NEGATIVE mg/dL
Nitrite: NEGATIVE
Protein, ur: NEGATIVE mg/dL
Urobilinogen, UA: 0.2 mg/dL (ref 0.0–1.0)
pH: 5.5 (ref 5.0–8.0)

## 2012-07-17 LAB — GLUCOSE, CAPILLARY
Glucose-Capillary: 213 mg/dL — ABNORMAL HIGH (ref 70–99)
Glucose-Capillary: 199 mg/dL — ABNORMAL HIGH (ref 70–99)
Glucose-Capillary: 227 mg/dL — ABNORMAL HIGH (ref 70–99)

## 2012-07-17 MED ORDER — SODIUM CHLORIDE 0.9 % IJ SOLN
INTRAMUSCULAR | Status: AC
  Administered 2012-07-17: 18:00:00
  Filled 2012-07-17: qty 3

## 2012-07-17 MED ORDER — IBUPROFEN 800 MG PO TABS
800.0000 mg | ORAL_TABLET | Freq: Four times a day (QID) | ORAL | Status: DC | PRN
  Administered 2012-07-17 – 2012-07-18 (×3): 800 mg via ORAL
  Filled 2012-07-17 (×3): qty 1

## 2012-07-17 MED ORDER — MAGNESIUM HYDROXIDE 400 MG/5ML PO SUSP
15.0000 mL | Freq: Every day | ORAL | Status: DC | PRN
  Administered 2012-07-17 – 2012-07-20 (×4): 15 mL via ORAL
  Filled 2012-07-17 (×4): qty 30

## 2012-07-17 NOTE — Care Management Note (Signed)
    Page 1 of 1   07/20/2012     1:25:08 PM   CARE MANAGEMENT NOTE 07/20/2012  Patient:  [name] [name], [name] [name]   Account Number:  [id number]  Date Initiated:  07/17/2012  Documentation initiated by:  Sharrie Rothman  Subjective/Objective Assessment:   Pt admitted from home with possible CVA. Pt lives with husband and is independent with ADL's. Pt will return home at discharge.     Action/Plan:   No CM needs noted at this time. Pt refuses any HH at this time. Will continue to follow.   Anticipated DC Date:  07/18/2012   Anticipated DC Plan:  HOME/SELF CARE      DC Planning Services  CM consult      Choice offered to / List presented to:             Status of service:  Completed, signed off Medicare Important Message given?   (If response is "NO", the following Medicare IM given date fields will be blank) Date Medicare IM given:   Date Additional Medicare IM given:    Discharge Disposition:  HOME/SELF CARE  Per UR Regulation:    If discussed at Long Length of Stay Meetings, dates discussed:    Comments:  07/20/12 1325 Arlyss Queen, RN BSN CM Pt discharged home today. No CM/HH needs at this time. Pt denies any HH services at this time.  07/17/12 1337 Arlyss Queen, RN BSN CM

## 2012-07-17 NOTE — Progress Notes (Signed)
Patient was complaining of her head hurting all day. She states that the vicodin only helps her back, and she was also complaining of being constipated. Doctor was notified and new orders were given for Ibuprofen and milk of magnesia. Will continue to monitor patient.

## 2012-07-17 NOTE — Progress Notes (Signed)
UR Chart Review Completed  

## 2012-07-17 NOTE — Progress Notes (Signed)
Inpatient Diabetes Program Recommendations  AACE/ADA: New Consensus Statement on Inpatient Glycemic Control  Target Ranges:  Prepandial:   less than 140 mg/dL      Peak postprandial:   less than 180 mg/dL (1-2 hours)      Critically ill patients:  140 - 180 mg/dL  Pager:  [phone] Hours:  8 am-10pm   Reason for Visit: Elevated prandial glucose:  168, 213 mg/dl  Inpatient Diabetes Program Recommendations Insulin - Meal Coverage: Begin with partial home meal coverage dose:  Consider starting with Novolog 4-6 units TID  Note:  Patient takes Apidra 20 units TID at home  Alfredia Client PhD, RN Diabetes Coordinator  Office:  [phone] Team Pager:  [phone]

## 2012-07-17 NOTE — Progress Notes (Signed)
NAME:  MARKEYA, [name] [name]              ACCOUNT NO.:  [id number]  MEDICAL RECORD NO.:  [id number]  LOCATION:  A316                          FACILITY:  [facility]  PHYSICIAN:  Unique Searfoss G. Renard Matter, MD   DATE OF BIRTH:  02-Jun-1936  DATE OF PROCEDURE: DATE OF DISCHARGE:                                PROGRESS NOTE   SUBJECTIVE:  The patient was admitted to the hospital with acute left- sided facial weakness and muscle weakness in left arm and leg.  She does have chronic history of diabetes, chronic obstructive pulmonary disease, coronary artery disease, and hypertension.  She did have CT of the head and MRI of the brain without contrast.  CT of the head showed atrophy and small vessel ischemic changes.  No acute intracranial abnormalities. MRI results pending.  The patient was seen in consultation by Neurology, Dr. Gerilyn Pilgrim.  Actually the pain was a little better this morning.  OBJECTIVE:  VITAL SIGNS:  Blood pressure 138/67, respirations 20, pulse 65, temp 98.1. LUNGS:  Clear to P and A. HEART:  Regular rhythm. ABDOMEN:  No palpable organs or masses. NEUROLOGICAL:  The patient has weakness in left facial muscles.  Slight weakness in left arm and leg.  ASSESSMENT:  The patient was admitted with acute onset of left facial weakness, weakness in left arm and leg, possibly recurrence of the old stroke symptoms.  She does have diabetes has run high blood sugars.  PLAN:  Start Aggrenox once daily.  Continue aspirin.  The patient does have a history of blockage of the left carotid artery.  Continue current regimen.     Beverley Sherrard G. Renard Matter, MD     AGM/MEDQ  D:  07/17/2012  T:  07/17/2012  Job:  [id number]

## 2012-07-18 LAB — GLUCOSE, CAPILLARY
Glucose-Capillary: 146 mg/dL — ABNORMAL HIGH (ref 70–99)
Glucose-Capillary: 196 mg/dL — ABNORMAL HIGH (ref 70–99)

## 2012-07-18 LAB — CARDIAC PANEL(CRET KIN+CKTOT+MB+TROPI)
Relative Index: INVALID (ref 0.0–2.5)
Troponin I: <0.3 ng/mL (ref <0.30)

## 2012-07-18 MED ORDER — TRAMADOL HCL 50 MG PO TABS
50.0000 mg | ORAL_TABLET | Freq: Four times a day (QID) | ORAL | Status: DC | PRN
  Administered 2012-07-18 – 2012-07-19 (×4): 50 mg via ORAL
  Filled 2012-07-18 (×4): qty 1

## 2012-07-18 MED ORDER — SODIUM CHLORIDE 0.9 % IJ SOLN
INTRAMUSCULAR | Status: AC
  Filled 2012-07-18: qty 3

## 2012-07-18 MED ORDER — FLEET ENEMA 7-19 GM/118ML RE ENEM
1.0000 | ENEMA | Freq: Once | RECTAL | Status: AC
  Administered 2012-07-18: 1 via RECTAL

## 2012-07-18 MED ORDER — GUAIFENESIN 100 MG/5ML PO SOLN
200.0000 mg | ORAL | Status: DC | PRN
  Administered 2012-07-18 – 2012-07-19 (×3): 200 mg via ORAL
  Filled 2012-07-18: qty 5
  Filled 2012-07-18: qty 118
  Filled 2012-07-18: qty 10
  Filled 2012-07-18 (×2): qty 5

## 2012-07-18 MED ORDER — LORAZEPAM 0.5 MG PO TABS
0.5000 mg | ORAL_TABLET | Freq: Once | ORAL | Status: AC
  Administered 2012-07-18: 0.5 mg via ORAL

## 2012-07-18 NOTE — Progress Notes (Signed)
Pt. Requesting med for headache. Dr. Ardelle Balls notified. Orders received. Medicated as ordered.

## 2012-07-18 NOTE — Progress Notes (Signed)
Pt. Requesting med for cough. Dr. Renard Matter notified. Orders received. Medicated as ordered.

## 2012-07-18 NOTE — Progress Notes (Signed)
NAME:  [name] [name], [name] [name]              ACCOUNT NO.:  [id number]  MEDICAL RECORD NO.:  [id number]  LOCATION:  A316                          FACILITY:  [facility]  PHYSICIAN:  Meagen Limones G. Renard Matter, MD   DATE OF BIRTH:  07-24-1936  DATE OF PROCEDURE: DATE OF DISCHARGE:                                PROGRESS NOTE   This patient was admitted to the hospital with acute left-sided facial weakness and muscle weakness left arm and leg.  She does have chronic history of diabetes, chronic obstructive pulmonary disease, coronary artery disease and hypertension.  CT of the head and MRI of the brain were done without contrast.  CT of the head showed atrophy and small vessel ischemic changes.  No intracranial abnormalities.  The patient has improved slowly.  MRI showed no acute intracranial abnormality, stable noncontrast MRI of her brain.  OBJECTIVE:  VITAL SIGNS:  Blood pressure 117/60, respirations 18, pulse 52, temp 98.2. LUNGS:  Clear to P and A. HEART:  Regular rhythm. ABDOMEN:  No palpable organs or masses. NEUROLOGIC:  The patient has weakness of the left facial muscles. Slight weakness left arm and leg.  ASSESSMENT:  The patient was admitted with acute onset of left facial weakness, weakness left arm and leg.  This was felt to be possible recurrence of old stroke symptoms.  She does have diabetes and at times runs high blood sugars.  PLAN:  To continue current regimen.  Start Aggrenox daily.  We will attempt to get the patient out of the hospital in 1-2 days.     [name] [name] G. Renard Matter, MD     AGM/MEDQ  D:  07/18/2012  T:  07/18/2012  Job:  [id number]

## 2012-07-18 NOTE — Progress Notes (Signed)
Pt. Now c/o chest pain, specifically radiating from front to side. Pt. Anxious, attempt to reassure by RN and staff. Dr. Renard Matter notified, orders for  EKG, labs, & oxygen and additional dose of ativan ordered. Dr. Renard Matter will be in to examine pt.

## 2012-07-18 NOTE — Evaluation (Signed)
Physical Therapy Evaluation Patient Details Name: [name] [name] MRN: [id number] DOB: [birth year] Today's Date: 07/18/2012 Time:  -     PT Assessment / Plan / Recommendation Clinical Impression  Patient was able to perform all tasks well today. Patient has had several instability, however, was able to recover balance through righting self. Patient was able to ambulate using a RW at [employer] and without an AD at Supervision. Stair climbing activities with handrail assist at Supervision. LLE muscle strength grossly graded 3+/5; pt noted numbness to left lower lip. Pt may benefit from HHPT should be discharged to home to ensure safety and follow-through of tx     PT Assessment  Patient needs continued PT services    Follow Up Recommendations  Home health PT    Barriers to Discharge        Equipment Recommendations       Recommendations for Other Services     Frequency Min 5X/week    Precautions / Restrictions Precautions Precautions: Fall Restrictions Weight Bearing Restrictions: No         Mobility  Bed Mobility Details for Bed Mobility Assistance: Independent on bed mobility  Transfers Transfers: Sit to Stand;Stand to Sit;Stand Pivot Transfers Sit to Stand: 6: Modified independent (Device/Increase time) Stand to Sit: 6: Modified independent (Device/Increase time) Stand Pivot Transfers: 6: Modified independent (Device/Increase time) Ambulation/Gait Ambulation/Gait Assistance: 6: Modified independent (Device/Increase time);5: Supervision Ambulation Distance (Feet): 75 Feet Assistive device: Rolling walker;None Ambulation/Gait Assistance Details: Mod Indep using a RW and Supervision without an AD  Gait Pattern: Decreased hip/knee flexion - right;Ataxic;Decreased trunk rotation;Decreased stride length Gait velocity: impulsive ambulation  Stairs: Yes Stairs Assistance: 4: Min guard Stair Management Technique: One rail Right Number of Stairs: 2     Exercises Total Joint  Exercises Hip ABduction/ADduction: AROM;Both;10 reps Long Arc Quad: AROM;10 reps;Seated Marching in Standing: AROM;10 reps;Standing Low Level/ICU Exercises Ankle Circles/Pumps: AROM;10 reps;Seated   PT Diagnosis: Difficulty walking;Generalized weakness  PT Problem List: Decreased strength;Decreased balance PT Treatment Interventions: Gait training;Stair training;Functional mobility training;Therapeutic activities;Balance training;Patient/family education   PT Goals Acute Rehab PT Goals PT Goal Formulation: With patient Pt will go Sit to Stand: Independently PT Goal: Sit to Stand - Progress: Goal set today Pt will go Stand to Sit: Independently PT Goal: Stand to Sit - Progress: Goal set today Pt will Ambulate: >150 feet;Independently PT Goal: Ambulate - Progress: Goal set today Pt will Go Up / Down Stairs: 1-2 stairs;Independently PT Goal: Up/Down Stairs - Progress: Goal set today  Visit Information       Subjective Data  Patient Stated Goal: To return home    Prior Functioning  Home Living Lives With: Spouse Available Help at Discharge: Family Type of Home: House Home Access: Stairs to enter [employer] of Steps: two steps to entrance of house  Entrance Stairs-Rails: Can reach both Home Layout: One level Firefighter: Standard Home Adaptive Equipment: None Prior Function Level of Independence: Independent Able to Take Stairs?: Yes Driving: Yes Vocation: Retired Musician: No difficulties Dominant Hand: Right    Cognition  Overall Cognitive Status: Appears within functional limits for tasks assessed/performed Arousal/Alertness: Awake/alert Orientation Level: Appears intact for tasks assessed Behavior During Session: [facility] for tasks performed    Extremity/Trunk Assessment Right Upper Extremity Assessment RUE ROM/Strength/Tone: Within functional levels Left Upper Extremity Assessment LUE ROM/Strength/Tone: Deficits LUE  ROM/Strength/Tone Deficits: ROM WNL; normotonic; grossly graded = Right Lower Extremity Assessment RLE ROM/Strength/Tone: Within functional levels Left Lower Extremity Assessment LLE ROM/Strength/Tone:  Deficits LLE ROM/Strength/Tone Deficits: LLE ROM WNL; normotonic to LLE; grossly graded 3+/5  LLE Sensation: WFL - Light Touch;WFL - Proprioception LLE Coordination: WFL - gross/fine motor;WFL - gross motor   Balance Balance Balance Assessed: Yes Static Standing Balance Static Standing - Balance Support: No upper extremity supported Dynamic Standing Balance Dynamic Standing - Balance Support: No upper extremity supported;During functional activity Dynamic Standing - Level of Assistance: 6: Modified independent (Device/Increase time) Dynamic Standing - Balance Activities: Reaching for objects;Forward lean/weight shifting  End of Session PT - End of Session Equipment Utilized During Treatment: Gait belt Activity Tolerance: Patient tolerated treatment well Patient left: in chair;with call bell/phone within reach Nurse Communication: Mobility status    Marigny Borre, [name] 07/18/2012, 11:21 AM

## 2012-07-18 NOTE — Progress Notes (Signed)
Pt requesting additional laxative other than milk of magnesia. Fleets enema administered as ordered with good results.

## 2012-07-19 ENCOUNTER — Inpatient Hospital Stay (HOSPITAL_COMMUNITY): Payer: Medicare Other

## 2012-07-19 LAB — GLUCOSE, CAPILLARY
Glucose-Capillary: 105 mg/dL — ABNORMAL HIGH (ref 70–99)
Glucose-Capillary: 149 mg/dL — ABNORMAL HIGH (ref 70–99)
Glucose-Capillary: 128 mg/dL — ABNORMAL HIGH (ref 70–99)

## 2012-07-19 LAB — CARDIAC PANEL(CRET KIN+CKTOT+MB+TROPI)
CK, MB: 1.9 ng/mL (ref 0.3–4.0)
Relative Index: INVALID (ref 0.0–2.5)
Relative Index: INVALID (ref 0.0–2.5)
Troponin I: <0.3 ng/mL (ref <0.30)
Troponin I: <0.3 ng/mL (ref <0.30)

## 2012-07-19 NOTE — Progress Notes (Signed)
NAME:  [name] [name], [name] [name]              ACCOUNT NO.:  [id number]  MEDICAL RECORD NO.:  [id number]  LOCATION:  A316                          FACILITY:  [facility]  PHYSICIAN:  Zahid Carneiro G. Renard Matter, MD   DATE OF BIRTH:  12/03/1936  DATE OF PROCEDURE: DATE OF DISCHARGE:                                PROGRESS NOTE   SUBJECTIVE:  This patient was admitted to the hospital with acute left- sided facial weakness and muscle weakness in left arm and leg.  Symptoms of a stroke.  She was also noted to have a history of diabetes, which had been difficult to control, chronic obstructive pulmonary disease, coronary artery disease, and hypertension.  Last evening, she had increased symptoms of headache and anterior chest pain.  An electrocardiogram showed inferior infarct, age undetermined, nonspecific T-wave abnormalities in lateral leads.  Her troponin remained normal and she has responded to conservative treatment.  OBJECTIVE:  VITAL SIGNS:  Blood pressure 148/78, respirations 20, pulse 74, temp 98. LUNGS:  Clear to P and A. HEART:  Regular rhythm. ABDOMEN:  No palpable organs or masses. NEUROLOGIC:  The patient had weakness of left facial muscles.  Slight weakness in left arm and left leg.  ASSESSMENT:  The patient was admitted with acute onset of left facial weakness, weakness in left arm and left leg.  This was felt to be possibly recurrence of old stroke symptoms.  She does have diabetes and at times runs high blood sugars.  She was noted to have anterior chest pain yesterday.  Does have a history of coronary artery disease and had EKG, which showed inferior infarct of age undetermined, but troponins have been normal.  PLAN:  To continue her current medications with Aggrenox daily. Continue to monitor cardiac status.     Brittain Hosie G. Renard Matter, MD     AGM/MEDQ  D:  07/19/2012  T:  07/19/2012  Job:  [id number]

## 2012-07-19 NOTE — Progress Notes (Signed)
Physical Therapy Treatment Patient Details Name: [name] [name] MRN: [id number] DOB: 1936/05/25 Today's Date: 07/19/2012 Time: 1300-1320 PT Time Calculation (min): 20 min  PT Assessment / Plan / Recommendation Comments on Treatment Session  Pt was seen today for therapy, however, tx was cut short secondary to patient's c/o headache and nausea. Nurse was notified for meds. Patient was able to perform seated and standing exercises and ambulated at hallway at Mod Indep using a RW. Pt stated that she will do more tomorrow in therapy when she gets better. Pt was informed to ask for assistance when she needs to got to the toilet to prevent possible fall occurence.     Follow Up Recommendations  Home health PT    Barriers to Discharge        Equipment Recommendations  Defer to next venue    Recommendations for Other Services    Frequency Min 5X/week   Plan Discharge plan remains appropriate    Precautions / Restrictions Precautions Precautions: Fall Restrictions Weight Bearing Restrictions: No       Mobility  Transfers Transfers: Stand to Sit;Sit to Stand;Stand Pivot Transfers Sit to Stand: 6: Modified independent (Device/Increase time) Stand to Sit: 6: Modified independent (Device/Increase time) Stand Pivot Transfers: 6: Modified independent (Device/Increase time) Ambulation/Gait Ambulation/Gait Assistance: 6: Modified independent (Device/Increase time) Ambulation Distance (Feet): 75 Feet Assistive device: Rolling walker Gait Pattern: Decreased stride length;Ataxic;Decreased trunk rotation Gait velocity: increased velocity; cueing for proper gait sequencing and modulation of speed  Stairs: No    Exercises Total Joint Exercises Ankle Circles/Pumps: AROM;Both;20 reps;Seated Quad Sets: AROM;10 reps;Supine Long Arc Quad: AROM;Both;20 reps;Seated Knee Flexion: AROM;Seated;20 reps;Both Marching in Standing: AROM;20 reps;Both;Standing   PT Diagnosis:    PT Problem List:   PT  Treatment Interventions:     PT Goals Acute Rehab PT Goals PT Goal Formulation: With patient Potential to Achieve Goals: Good Pt will go Sit to Stand: Independently PT Goal: Sit to Stand - Progress: Goal set today Pt will go Stand to Sit: Independently PT Goal: Stand to Sit - Progress: Goal set today Pt will Ambulate: >150 feet;Independently PT Goal: Ambulate - Progress: Goal set today Pt will Go Up / Down Stairs: 1-2 stairs;Independently PT Goal: Up/Down Stairs - Progress: Goal set today  Visit Information  Last PT Received On: 07/19/12    Subjective Data  Subjective: Pt c/o headache and nausea Patient Stated Goal: To get better and return home    Cognition  Overall Cognitive Status: Appears within functional limits for tasks assessed/performed Arousal/Alertness: Awake/alert Orientation Level: Appears intact for tasks assessed Behavior During Session: [facility] for tasks performed    Balance     End of Session PT - End of Session Equipment Utilized During Treatment: Gait belt Activity Tolerance: Patient limited by pain;Patient limited by fatigue Patient left: in bed;with call bell/phone within reach;with bed alarm set;with nursing in room Nurse Communication: Mobility status;Precautions;Patient requests pain meds   GP     [name] [name], [name] [name] 07/19/2012, 1:36 PM

## 2012-07-20 ENCOUNTER — Other Ambulatory Visit (HOSPITAL_COMMUNITY): Payer: Medicare Other

## 2012-07-20 LAB — GLUCOSE, CAPILLARY
Glucose-Capillary: 72 mg/dL (ref 70–99)
Glucose-Capillary: 175 mg/dL — ABNORMAL HIGH (ref 70–99)

## 2012-07-20 MED ORDER — LEVOFLOXACIN IN D5W 500 MG/100ML IV SOLN
500.0000 mg | INTRAVENOUS | Status: DC
  Administered 2012-07-20: 500 mg via INTRAVENOUS
  Filled 2012-07-20 (×2): qty 100

## 2012-07-20 NOTE — Progress Notes (Signed)
UR Chart Review Completed  

## 2012-07-20 NOTE — Discharge Summary (Signed)
NAME:  BAILA, ROUSE              ACCOUNT NO.:  [id number]  MEDICAL RECORD NO.:  [id number]  LOCATION:  A316                          FACILITY:  [facility]  PHYSICIAN:  Debarah Mccumbers G. Beadie Matsunaga, MD   DATE OF BIRTH:  1936/12/14  DATE OF ADMISSION:  07/16/2012 DATE OF DISCHARGE:  07/29/2013LH                              DISCHARGE SUMMARY   ADDENDUM  The patient was discharged on the following medications; nitroglycerin 0.4 mg p.r.n. as needed, Synthroid 25 mcg daily, metformin 500 mg twice a day, Lantus insulin 60 units at bedtime, Apidra 20 units 3 times a day before meals, Kenalog cream 0.1% applied topically b.i.d., hydrocodone and acetaminophen 10/500 one half to one tablet daily as needed, can be repeated in 4 hours if needed, Trimpex 100 mg at bedtime, amlodipine 5 mg once a day, Prinzide/Zestoretic 20/12.5 mg daily, Pravachol 80 mg daily, aspirin 325 mg daily, Ativan 0.5 mg daily, Aleve 220 mg twice a day.     Vinicius Brockman G. Renard Matter, MD     AGM/MEDQ  D:  07/20/2012  T:  07/20/2012  Job:  [id number]

## 2012-07-20 NOTE — Progress Notes (Signed)
Patient discharged home with family.  IV removed - WNL.  Given prescription by MD - instructed how to take.  No other changes to medications made.  Patient verbalizes understanding.  Patient is having incontinence since being admitted that she was concerned about.  Educated patient that may be an effect of the stroke.  Patient states she will contact MD if continues to be an issue and doesn't improve.  Patient states that she will call MD if she wants him to address it.  At this time states she is just ready to go home. All belongings sent home with patient.  Patient or family have no questions at this time and patient is stable for discharge

## 2012-07-20 NOTE — Progress Notes (Signed)
NAME:  CAMAYA, GANNETT              ACCOUNT NO.:  [id number]  MEDICAL RECORD NO.:  [id number]  LOCATION:                                 FACILITY:  PHYSICIAN:  Etheridge Geil A. Gerilyn Pilgrim, M.D. DATE OF BIRTH:  1936-12-14  DATE OF PROCEDURE:  07/17/2012 DATE OF DISCHARGE:                                PROGRESS NOTE   SUBJECTIVE:  The patient reports overall she is about the same.  OBJECTIVE:  She is awake and alert.  Speech, language, and cognition are intact.  Cranial nerve evaluation continues to show weakness of left lower facial muscle.  Tongue is midline.  Pupils are reactive.  Visual fields are intact.  Upper facial muscles are normal.  Motor examination again she continues to have the same pattern proximal mostly in her upper extremity, 4/5.  Hand grip is 5; however, left lower extremity 4, proximally right 5.  She continued to complain of some headaches, but she did have this previously.  We will have to watch that and make sure it is not coming from the Aggrenox, she is on no titration dose, however.  Urinalyses was unremarkable for UTI, shows glucose of 100.  ASSESSMENT AND PLAN:  Recurrent focal neurological symptoms without clear evidence of new infarct on MRI.  Again, this could be a recurrence of old stroke symptoms in the setting of metabolic derangement, hyperglycemia, and dehydration.  Did have change around her antiplatelet agents; however, from aspirin to Plavix.  She also will be ordered to have an EEG.     Jama Krichbaum A. Gerilyn Pilgrim, M.D.     KAD/MEDQ  D:  07/17/2012  T:  07/17/2012  Job:  [id number]

## 2012-07-20 NOTE — Discharge Summary (Signed)
NAME:  [name] [name], [name] [name]              ACCOUNT NO.:  [id number]  MEDICAL RECORD NO.:  [id number]  LOCATION:  A316                          FACILITY:  [facility]  PHYSICIAN:  Tynisa Vohs G. Renard Matter, MD   DATE OF BIRTH:  1936-03-15  DATE OF ADMISSION:  07/16/2012 DATE OF DISCHARGE:  LH                              DISCHARGE SUMMARY   DIAGNOSES: 1. 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 2. Lacunar infarctions involving the right thalamus. 3. Insulin-dependent diabetes. 4. Dehydration. 5. Chronic obstructive pulmonary disease. 6. Bronchitis. 7. History of coronary artery disease. 8. Hypertension. 9. Obesity.  CONDITION:  Stable and improved at time of discharge.  This patient presented to the office with acute onset of left facial weakness.  She has slight weakness in left arm and left leg.  The patient was sent to the emergency room with suspicion of this was either Bell's palsy or acute CVA.  The patient was seen in the emergency department by emergency room physician and it was noted that she had some left arm, left leg weakness and ataxia, also was noted to have severe left facial weakness.  The patient does have a history of poorly- controlled diabetes, hypertension, and CABG in 2001.  Emergency room physician did order a CT of the head without contrast, impression was atrophy and small-vessel chronic ischemic changes and deep cerebral white matter ischemia and no acute intracranial abnormalities. Following this, I did discuss with [facility] Neurology and it was felt that she should be admitted, but it was felt she was not a candidate for t-PA.  PHYSICAL EXAMINATION ON ADMISSION:  VITAL SIGNS:  Blood pressure 122/65, respirations 18, pulse 62, temp 98. HEENT:  Eyes; PERRLA.  TMs negative.  Oropharynx benign.  The patient has severe weakness in the left side of face. NECK:  Supple.  No JVD or thyroid abnormalities.  No carotid bruits. HEART:  Regular rhythm.  No  murmurs. LUNGS:  Clear to P and A. ABDOMEN:  No palpable organs or masses. NEUROLOGICAL:  The patient has muscle weakness of the left arm and left leg.  The patient has severe weakness of left side of the face.  Cranial nerves intact with the exception of left facial weakness.  No other pathological reflexes.  LABORATORY DATA:  CBC on admission; WBC 9900 with hemoglobin 15.1, hematocrit 45.0.  UA on admission essentially negative.  Chemistry; BUN 13, creatinine 0.66, calcium 8.9, glucose 196.  Alkaline phosphatase 84, albumin 2.9.  Cardiac profile on July 19, 2012; CK-MB 2, CK total of 61, troponin less than 0.30.  RADIOLOGY:  CT of the head on admission, atrophy with small vessel chronic ischemic changes of deep cerebral white matter, no acute intracranial abnormalities.  Chest x-ray; no active disease, status post CABG, stable scar in the left upper lobe.  MRI of brain; no acute intracranial abnormality, stable noncontrast appearance of brain.  HOSPITAL COURSE:  The patient at the time of her admission was placed on IV fluids, normal saline 125 mL/h.  She was continued on the following medications; amlodipine 5 mg daily, simvastatin 20 mg daily, metformin 500 mg b.i.d., lisinopril 20 mg daily, levothyroxine 25 mcg daily.  She was also  continued on insulin glargine 60 units at bedtime and insulin aspart according to sliding scale.  She was also maintained on hydrochlorothiazide 12.5 mg daily.  She was given Lovenox injection 40 mg subcutaneously daily for DVT prophylaxis and p.r.n. medications; Norco 5/325 every 4 hours p.r.n. for pain, Motrin 800 mg every 6 hours p.r.n., lorazepam 0.5 mg q.4h p.r.n., zolpidem 5 mg p.o. at bedtime p.r.n., and tramadol 50 mg every 6 hours p.r.n. for minor pain, and p.r.n. nitroglycerin 0.4 mg q.5 minutes p.r.n.  The patient was seen in consultation by Neurology, Dr. Gerilyn Pilgrim, who felt she had acute focal weakness of left side and left facial  weakness.  He felt that she had severe leukoencephalopathy likely the patient's gait instability and memory problems, and recommended that she restart Aggrenox b.i.d. dosing and continue current dose of aspirin.  The patient showed slow improvement.  During her hospital stay, the left facial muscle weakness improved and she was able to get up some to the chair in the room and was able to ambulate towards the latter part of her hospitalization. She did develop upper respiratory symptoms with productive cough and chest pain, was started on Levaquin towards the latter part of her hospitalization.  Her blood sugars during this period of time were high, but were in the range of 150-200.  She does experience some anterior chest pain towards the latter part of her hospital stay.  Cardiac panel showed a CK of 61, CK-MB 2, relative index less than 0.30.  It was felt after 4 days of hospitalization, the patient could be discharged home.     Ramsey Guadamuz G. Renard Matter, MD     AGM/MEDQ  D:  07/20/2012  T:  07/20/2012  Job:  [id number]

## 2012-10-06 ENCOUNTER — Ambulatory Visit (HOSPITAL_COMMUNITY)
Admission: RE | Admit: 2012-10-06 | Discharge: 2012-10-06 | Disposition: A | Discharge status: Home / Self Care | Payer: Medicare Other | Source: Ambulatory Visit | Attending: Family Medicine | Admitting: Family Medicine

## 2012-10-06 ENCOUNTER — Other Ambulatory Visit (HOSPITAL_COMMUNITY): Payer: Self-pay | Admitting: Family Medicine

## 2012-10-06 DIAGNOSIS — M25559 Pain in unspecified hip: Secondary | ICD-10-CM | POA: Insufficient documentation

## 2012-10-06 DIAGNOSIS — M5137 Other intervertebral disc degeneration, lumbosacral region: Secondary | ICD-10-CM | POA: Insufficient documentation

## 2012-10-06 DIAGNOSIS — R52 Pain, unspecified: Secondary | ICD-10-CM

## 2012-10-06 DIAGNOSIS — M51379 Other intervertebral disc degeneration, lumbosacral region without mention of lumbar back pain or lower extremity pain: Secondary | ICD-10-CM | POA: Insufficient documentation

## 2012-10-06 DIAGNOSIS — M25569 Pain in unspecified knee: Secondary | ICD-10-CM | POA: Insufficient documentation

## 2012-10-06 DIAGNOSIS — M545 Low back pain, unspecified: Secondary | ICD-10-CM | POA: Insufficient documentation

## 2012-10-06 DIAGNOSIS — M899 Disorder of bone, unspecified: Secondary | ICD-10-CM | POA: Insufficient documentation

## 2012-10-06 DIAGNOSIS — M949 Disorder of cartilage, unspecified: Secondary | ICD-10-CM | POA: Insufficient documentation

## 2012-10-15 IMAGING — CR DG LUMBAR SPINE COMPLETE 4+V
5 series · 5 of 5 positions shown · non-contrast
Comparison: CT of 08/31/2010

CLINICAL DATA: Pain and radiculopathy. No trauma history submitted.

LUMBAR SPINE - COMPLETE 4+ VIEW

[view not recorded (1 of 5)]
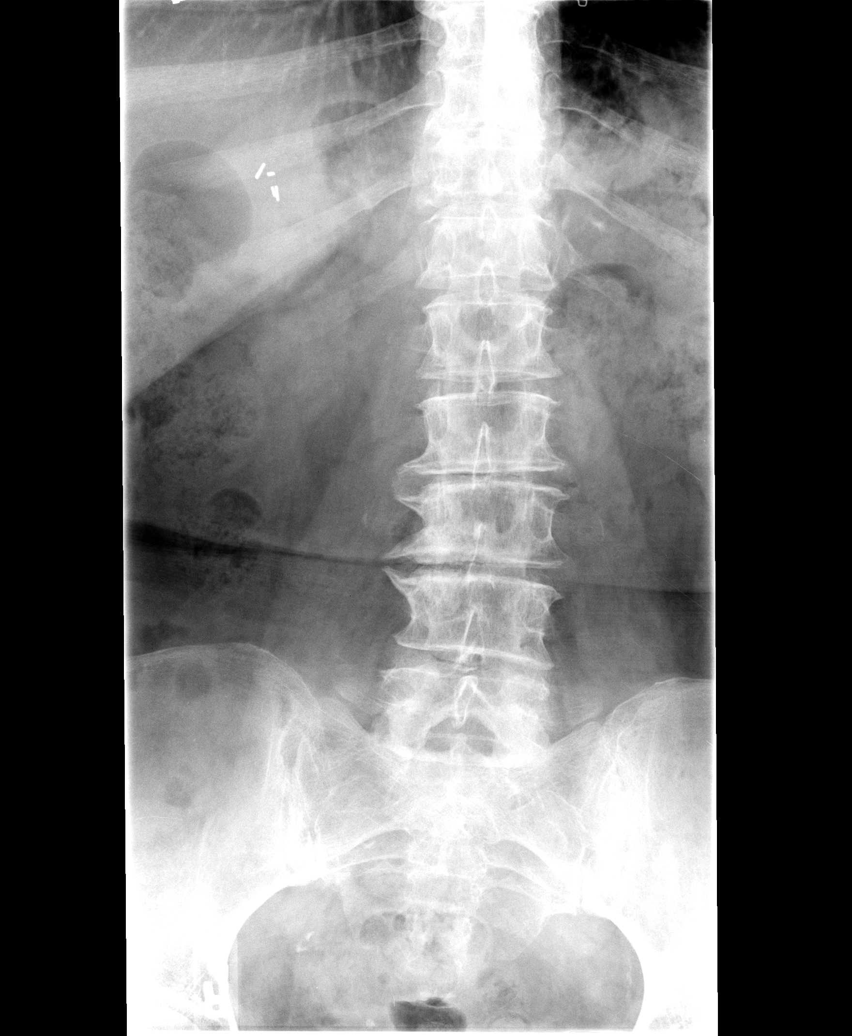

[view not recorded (2 of 5)]
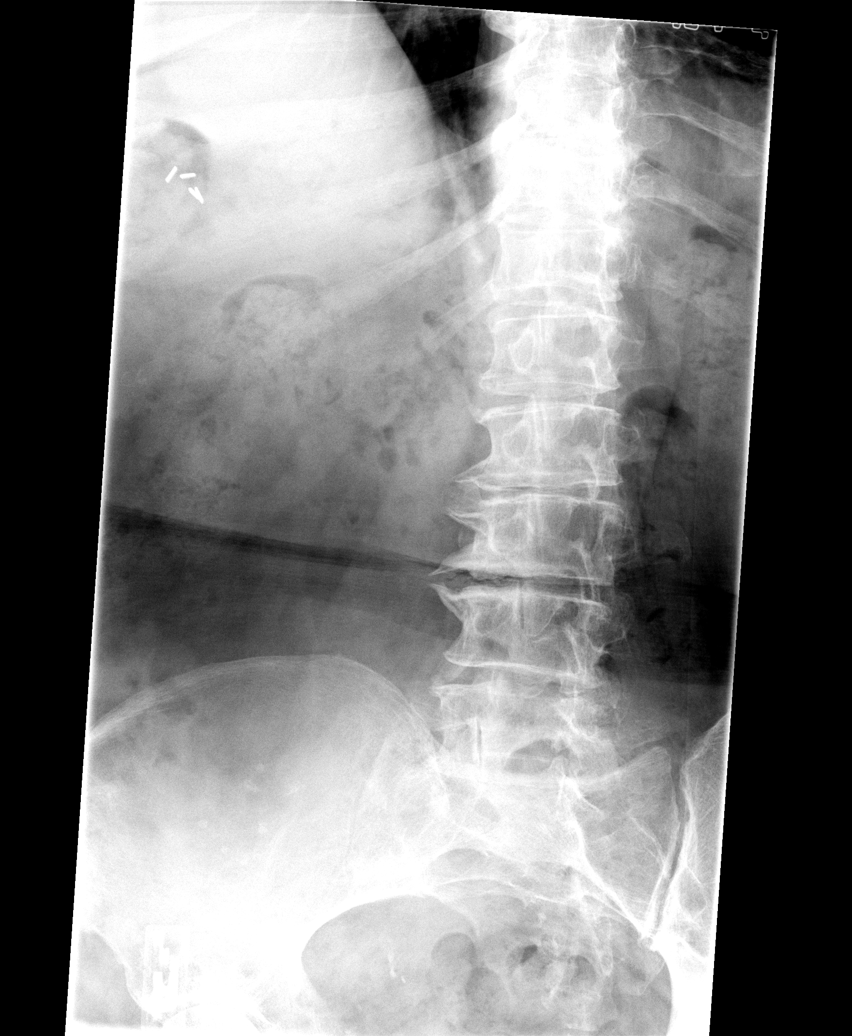

[view not recorded (3 of 5)]
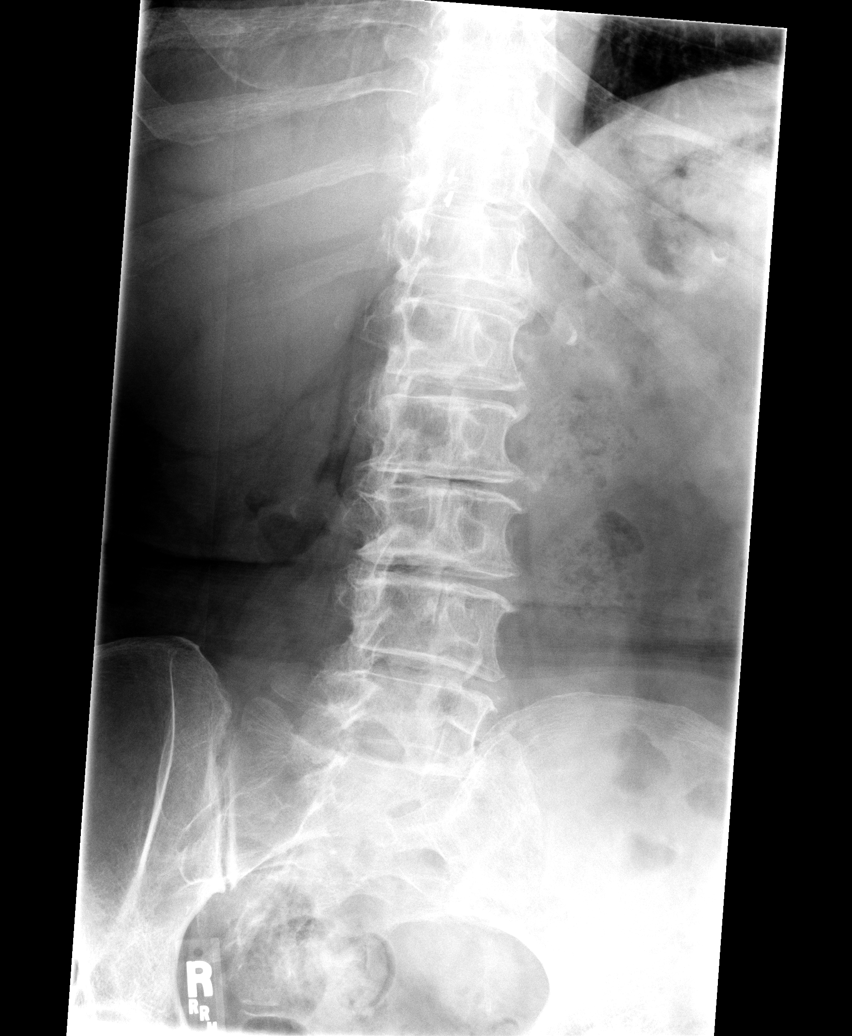

[view not recorded (4 of 5)]
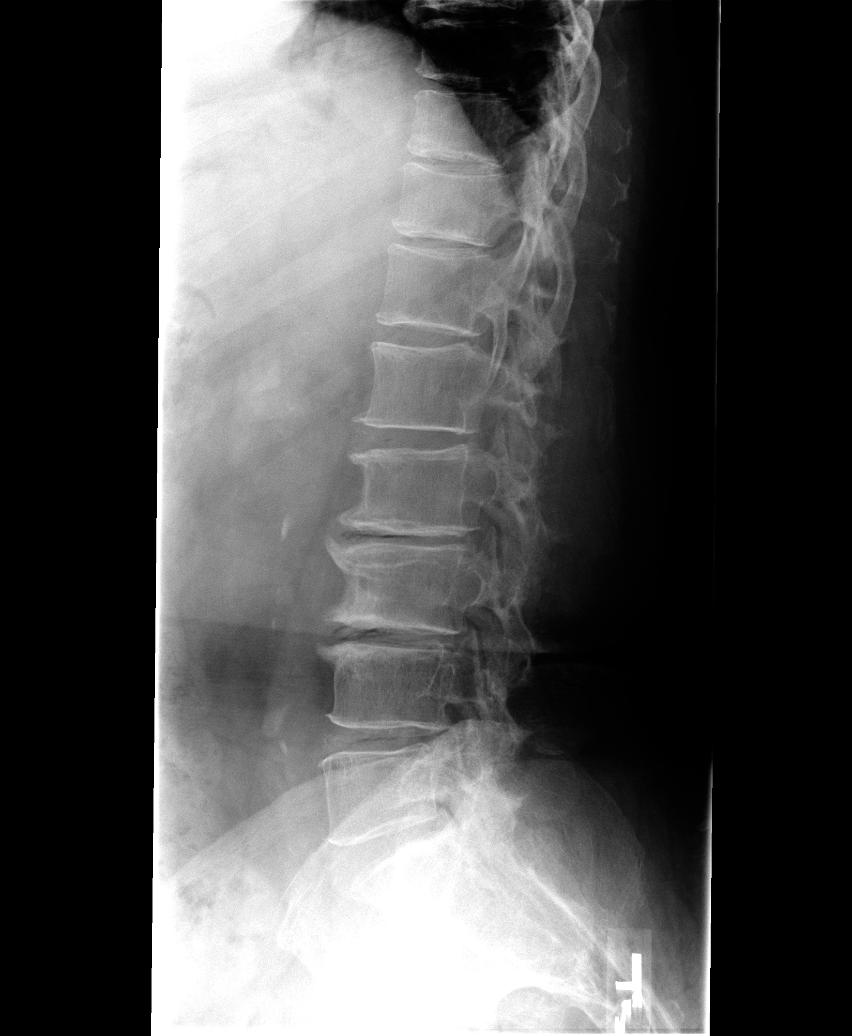

[view not recorded (5 of 5)]
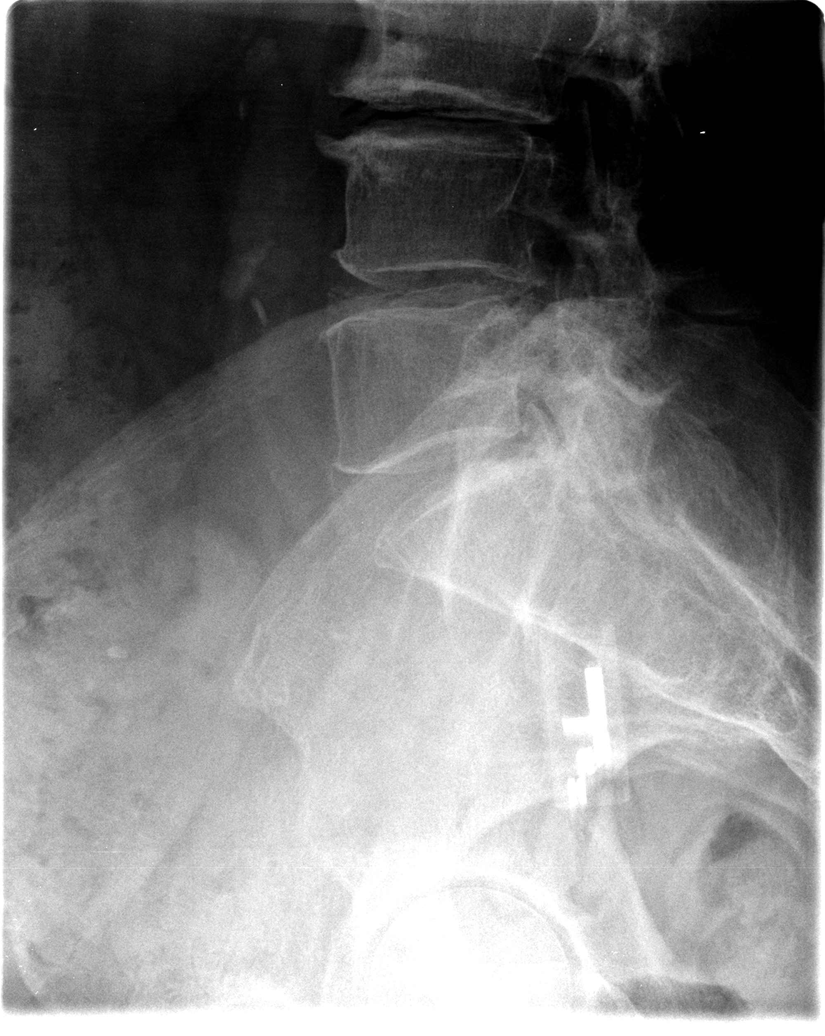

[5 of 5 positions shown; findings below may reference images not displayed]

FINDINGS: Five lumbar-type vertebral bodies demonstrate minimal
convex left curvature.  Maintenance of vertebral body height.  Mild
nonspecific straightening of expected lordosis.  Trace L4-L5
retrolisthesis is unchanged.  Advanced spondylosis/degenerative
disc disease.  Most marked at L2-3 through L4-L5.  Vacuum disc
phenomenon at these levels.  Facet arthropathy at L5-S1.  Disc
bulges at numerous levels, most severe at L1-L2 are visualized on
prior CT.
IMPRESSION: Advanced spondylosis.  Similar trace L4-L5 retrolisthesis.  No
acute findings.

## 2012-10-21 IMAGING — MR MR LUMBAR SPINE W/O CM
4 of 5 series · 12 of 48 positions shown · non-contrast
Comparison: Lumbar spine series 10/11/2010

CLINICAL DATA: Low back and left hip and leg pain.

MRI LUMBAR SPINE WITHOUT CONTRAST
TECHNIQUE: Multiplanar and multiecho pulse sequences of the lumbar
spine were obtained without intravenous contrast.

[Series 4: T2 · sagittal · 4.0mm · 0.32mm/px · 3 of 15 slices shown (1 of 2)]
[im 3/15]
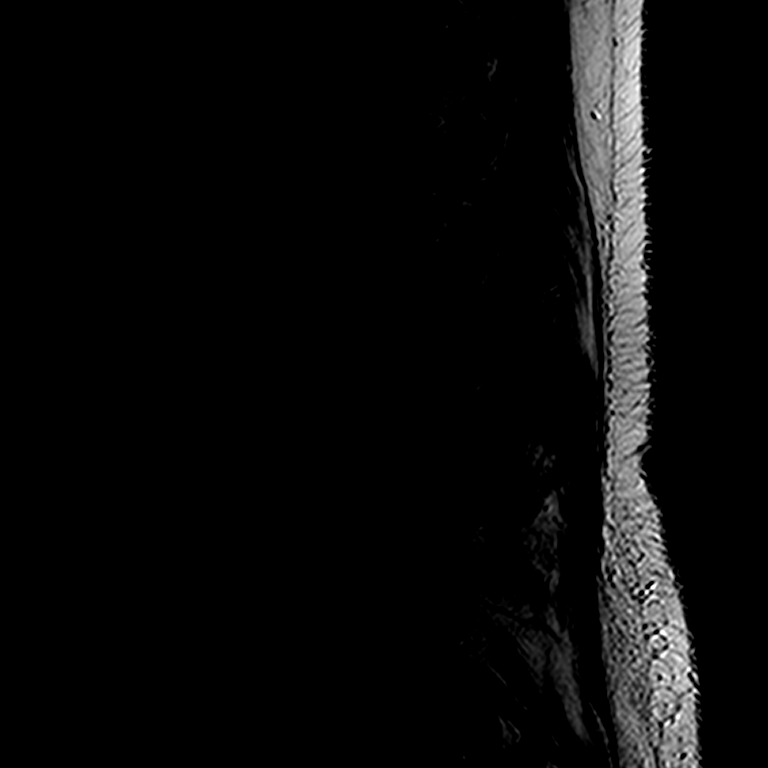
[im 9/15]
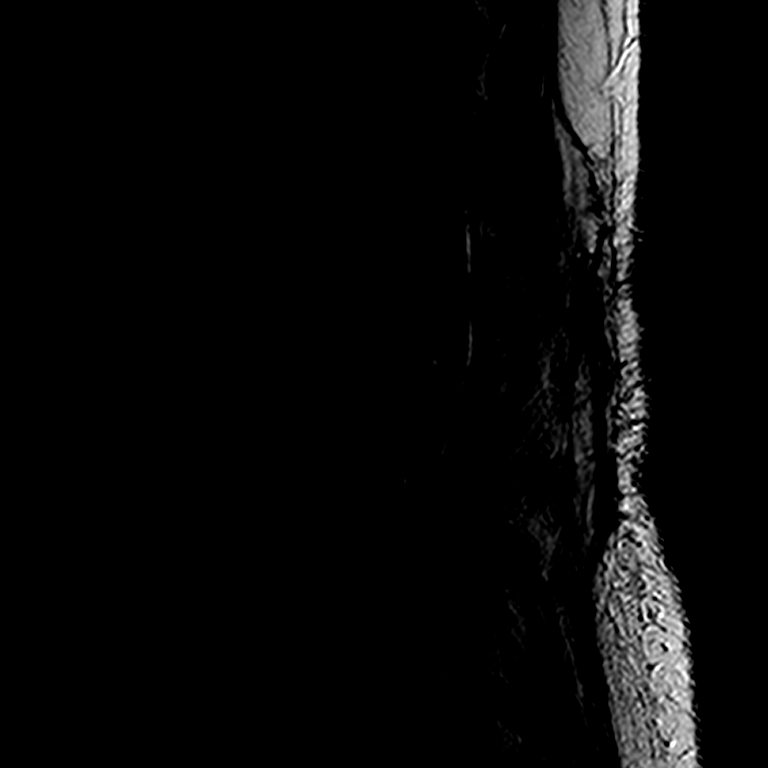
[im 15/15]
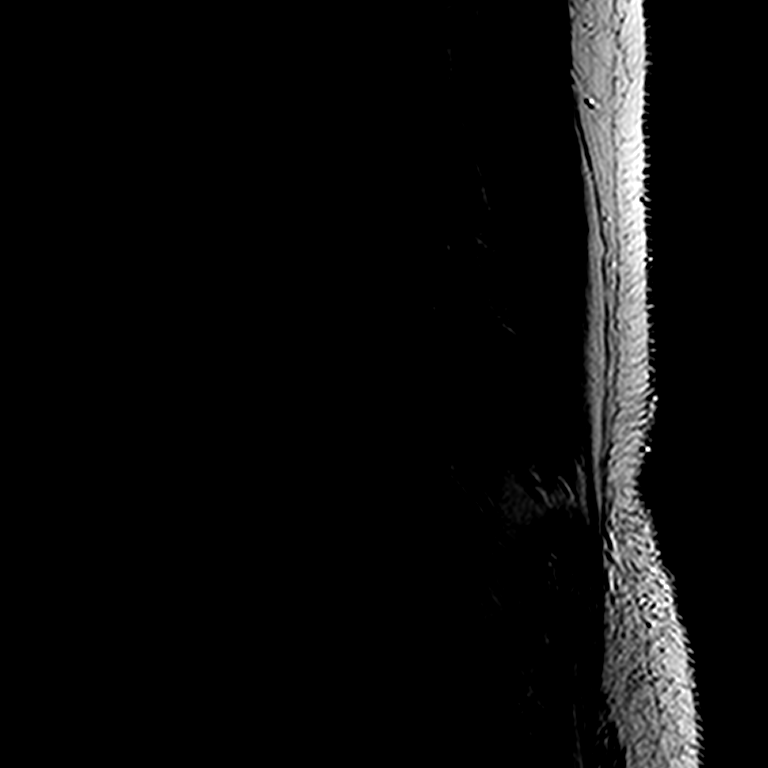

[Series 6: T1 · sagittal · 4.0mm · 0.40mm/px · 3 of 15 slices shown (1 of 2)]
[im 3/15]
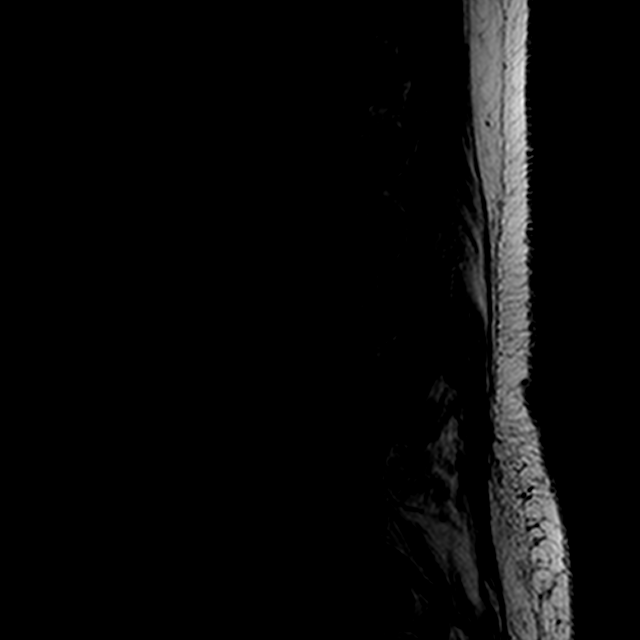
[im 9/15]
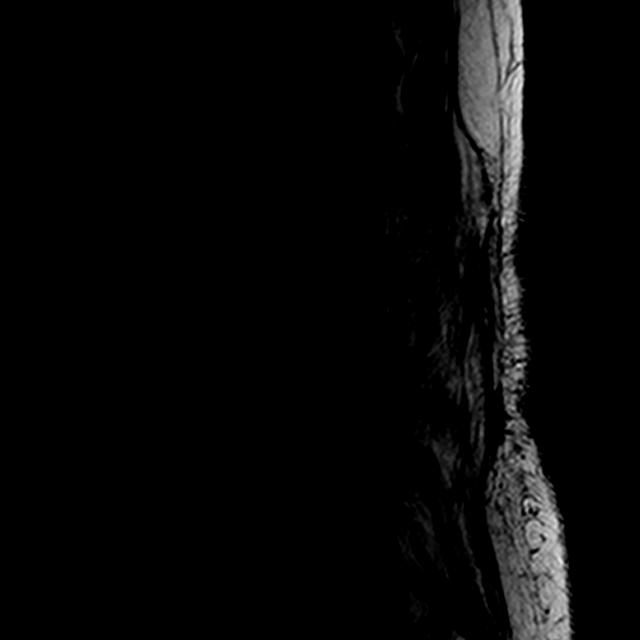
[im 15/15]
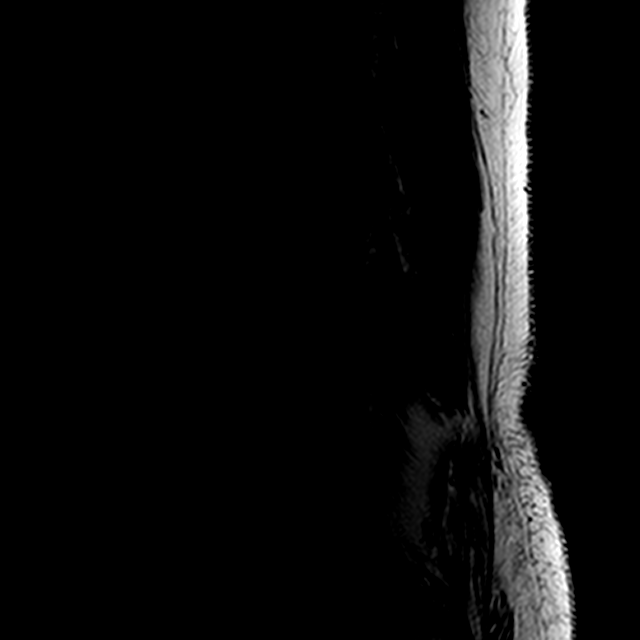

[Series 7: T2 · axial · 4.0mm · 0.26mm/px · z∈[-106,+36]mm · 3 of 41 slices shown (2 of 2)]
[im 6/41]
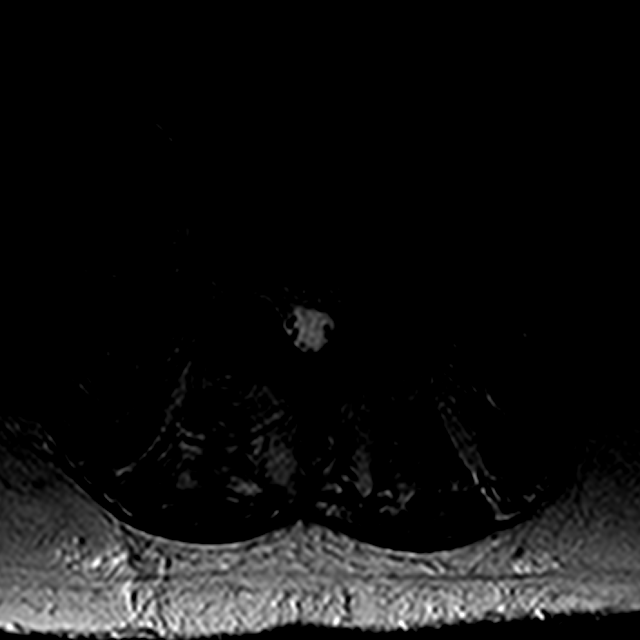
[im 21/41]
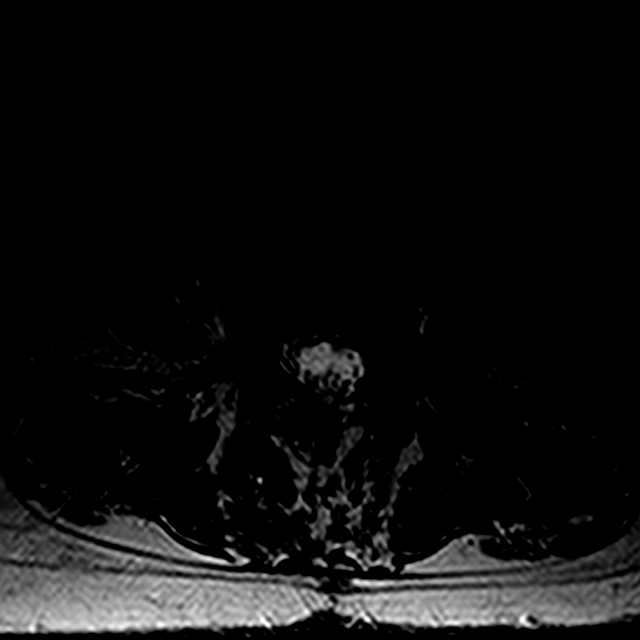
[im 35/41]
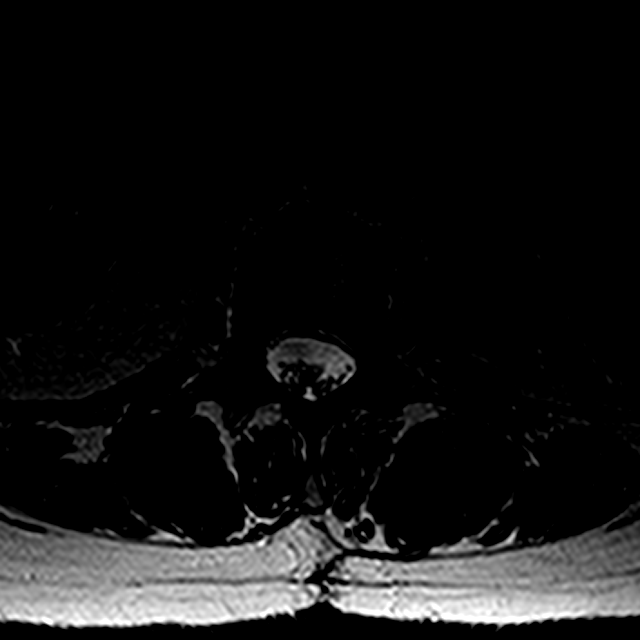

[Series 8: T1 · axial · 4.0mm · 0.34mm/px · z∈[-106,+36]mm · 3 of 41 slices shown (2 of 2)]
[im 6/41]
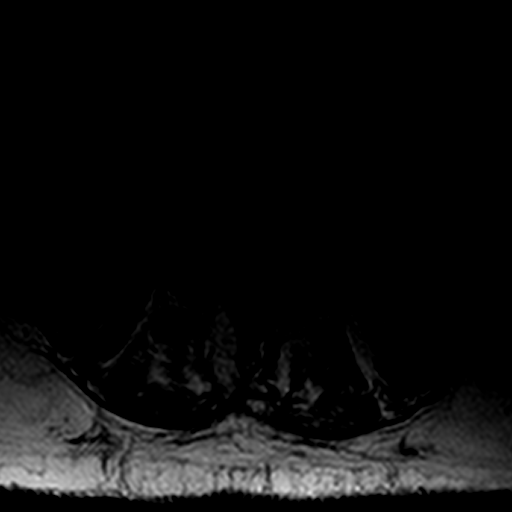
[im 21/41]
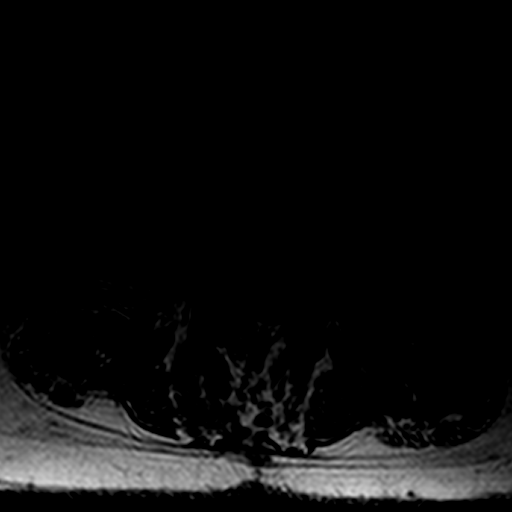
[im 35/41]
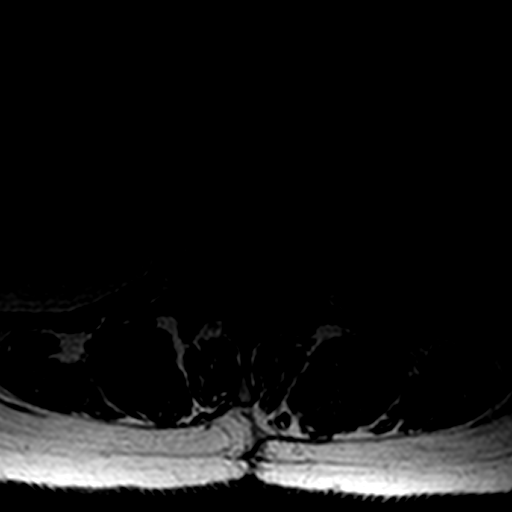

[12 of 48 positions shown; findings below may reference images not displayed]

FINDINGS: The sagittal MR images demonstrate straightening of the
normal lumbar lordosis.  Moderate to advanced degenerative lumbar
spondylosis with disc disease and facet disease.  The last full
intervertebral disc space is labeled L5-S1 (this correlates with
the plain film findings) and the conus medullaris terminates at L1.
The vertebral bodies demonstrate normal marrow signal except for
endplate reactive changes.

No significant retroperitoneal findings.

L1-2:  There is a moderate sized left paracentral disc protrusion
with mass effect on the left side of the thecal sac.  Mild medial
foraminal encroachment.  A left-sided laminectomy defect is
suspected.

L2-3:  Diffuse bulging degenerated annulus and osteophytic ridging
without significant spinal or lateral recess stenosis.  A left-
sided laminectomy defect is suspected.  There is mild foraminal
stenosis bilaterally.

L3-4:  Degenerative disc disease with a broad-based bulging annulus
and osteophytic ridging.  There is also short pedicles and facet
disease and the combination creates mild spinal and bilateral
lateral recess stenosis and mild bilateral foraminal stenosis.

L4-5:  Degenerative disc disease with a broad-based bulging
annulus.  There is a small left paracentral disc protrusion which
could impinge on the left L5 nerve root.  There is mild foraminal
encroachment bilaterally and moderate facet disease.

L5-S1:  Degenerative disease but no focal disc protrusion,
significant spinal or foraminal stenosis.  Minimal right foraminal
encroachment.
IMPRESSION: 1.  Moderate sized left paracentral disc protrusion at L1-2.
2.  Mild foraminal stenosis bilaterally at L2-3.
3.  Mild multifactorial spinal, lateral recess and bilateral
foraminal stenosis at L3-4.
4.  Small left paracentral disc protrusion at L4-5 potentially
irritating the left L5 nerve root in the lateral recess.
5.  Minimal right foraminal encroachment at L5-S1.

## 2013-01-11 ENCOUNTER — Encounter (HOSPITAL_COMMUNITY): Payer: Self-pay | Admitting: *Deleted

## 2013-01-11 ENCOUNTER — Emergency Department (HOSPITAL_COMMUNITY)
Admission: EM | Admit: 2013-01-11 | Discharge: 2013-01-11 | Disposition: A | Discharge status: Home / Self Care | Payer: Medicare Other | Attending: Emergency Medicine | Admitting: Emergency Medicine

## 2013-01-11 DIAGNOSIS — R110 Nausea: Secondary | ICD-10-CM | POA: Insufficient documentation

## 2013-01-11 DIAGNOSIS — I2510 Atherosclerotic heart disease of native coronary artery without angina pectoris: Secondary | ICD-10-CM | POA: Insufficient documentation

## 2013-01-11 DIAGNOSIS — E1129 Type 2 diabetes mellitus with other diabetic kidney complication: Secondary | ICD-10-CM | POA: Insufficient documentation

## 2013-01-11 DIAGNOSIS — J4489 Other specified chronic obstructive pulmonary disease: Secondary | ICD-10-CM | POA: Insufficient documentation

## 2013-01-11 DIAGNOSIS — Z8781 Personal history of (healed) traumatic fracture: Secondary | ICD-10-CM | POA: Insufficient documentation

## 2013-01-11 DIAGNOSIS — G4733 Obstructive sleep apnea (adult) (pediatric): Secondary | ICD-10-CM | POA: Insufficient documentation

## 2013-01-11 DIAGNOSIS — E669 Obesity, unspecified: Secondary | ICD-10-CM | POA: Insufficient documentation

## 2013-01-11 DIAGNOSIS — Z8679 Personal history of other diseases of the circulatory system: Secondary | ICD-10-CM | POA: Insufficient documentation

## 2013-01-11 DIAGNOSIS — M549 Dorsalgia, unspecified: Secondary | ICD-10-CM

## 2013-01-11 DIAGNOSIS — J449 Chronic obstructive pulmonary disease, unspecified: Secondary | ICD-10-CM | POA: Insufficient documentation

## 2013-01-11 DIAGNOSIS — G8929 Other chronic pain: Secondary | ICD-10-CM | POA: Insufficient documentation

## 2013-01-11 DIAGNOSIS — Z79899 Other long term (current) drug therapy: Secondary | ICD-10-CM | POA: Insufficient documentation

## 2013-01-11 DIAGNOSIS — N189 Chronic kidney disease, unspecified: Secondary | ICD-10-CM | POA: Insufficient documentation

## 2013-01-11 DIAGNOSIS — M545 Low back pain, unspecified: Secondary | ICD-10-CM | POA: Insufficient documentation

## 2013-01-11 DIAGNOSIS — E0590 Thyrotoxicosis, unspecified without thyrotoxic crisis or storm: Secondary | ICD-10-CM | POA: Insufficient documentation

## 2013-01-11 DIAGNOSIS — Z794 Long term (current) use of insulin: Secondary | ICD-10-CM | POA: Insufficient documentation

## 2013-01-11 DIAGNOSIS — I10 Essential (primary) hypertension: Secondary | ICD-10-CM | POA: Insufficient documentation

## 2013-01-11 DIAGNOSIS — Z8673 Personal history of transient ischemic attack (TIA), and cerebral infarction without residual deficits: Secondary | ICD-10-CM | POA: Insufficient documentation

## 2013-01-11 DIAGNOSIS — Z7982 Long term (current) use of aspirin: Secondary | ICD-10-CM | POA: Insufficient documentation

## 2013-01-11 MED ORDER — METHYLPREDNISOLONE SODIUM SUCC 125 MG IJ SOLR
125.0000 mg | Freq: Once | INTRAMUSCULAR | Status: AC
  Administered 2013-01-11: 125 mg via INTRAMUSCULAR
  Filled 2013-01-11: qty 2

## 2013-01-11 MED ORDER — HYDROCODONE-ACETAMINOPHEN 5-325 MG PO TABS
2.0000 | ORAL_TABLET | Freq: Once | ORAL | Status: AC
  Administered 2013-01-11: 2 via ORAL
  Filled 2013-01-11: qty 2

## 2013-01-11 MED ORDER — PREDNISONE 10 MG PO TABS: 20.0000 mg | ORAL_TABLET | Freq: Every day | ORAL | Status: DC

## 2013-01-11 MED ORDER — HYDROCODONE-ACETAMINOPHEN 5-325 MG PO TABS: 1.0000 | ORAL_TABLET | ORAL | Status: AC | PRN

## 2013-01-11 MED ORDER — IBUPROFEN 800 MG PO TABS
800.0000 mg | ORAL_TABLET | Freq: Once | ORAL | Status: AC
  Administered 2013-01-11: 800 mg via ORAL
  Filled 2013-01-11: qty 1

## 2013-01-11 NOTE — ED Notes (Signed)
Pt given saltine crackers and diet coke. Pt also c/o nausea

## 2013-01-11 NOTE — ED Provider Notes (Signed)
History     CSN: [id number]  Arrival date & time 01/11/13  0454   First MD Initiated Contact with Patient 01/11/13 0515      Chief Complaint  Patient presents with  . Hip Pain    (Consider location/radiation/quality/duration/timing/severity/associated sxs/prior treatment) HPI [name] [name] is a 77 y.o. female with a h/o ASCVD, COPD, DM, HTN, chronic back pain  who presents to the Emergency Department complaining of worsening lower back pain with radiation to the left buttock and leg since Friday. She is not aware of doing anything strenuous that made the back pain worse. She has taken tramadol without relief. Pain with movement, lying still or with changes in position.  Denies fever, chills, nausea, vomiting, urinary incontinence, numbness or tingling, extremity weakness.   PCP Dr. Renard Matter  Past Medical History  Diagnosis Date  . Arteriosclerotic cardiovascular disease (ASCVD)      CABG surgery in 2001; nl stress nuclear-2007; angiography in 01/2006- TO of LAD; 70% ostial stenosis      of a small ramus; patent grafts; normal EF; cath in 12/2009 essentially unchanged; EF-66%;  Congestive heart failure with preserved LV systolic function  . Hypertension   . Obstructive sleep apnea   . Cerebrovascular disease     H/o CVA  . Chronic back pain   . Vestibular neuritis     ataxia  . Varicose veins   . Diabetes mellitus     Requires insulin  . Acute renal failure     Possibly contrast-induced  . Obesity   . Chronic obstructive pulmonary disease     Chronic bronchitis  . Fall 2009    right humeral fracture  . Peripheral neuropathy     Left lower extremity; possibly radiculopathy  . Back pain, chronic   . Hyperthyroidism     Past Surgical History  Procedure Date  . Coronary artery bypass graft 6/01    SVG to diagonal, LIMA to LAD  . Rotator cuff repair   . Appendectomy   . Cholecystectomy   . Total abdominal hysterectomy   . Lumbar spine surgery     X2  . Knee surgery      Arthroscopic  . Carpal tunnel release     Bilateral  . Orif humerus fracture 2009    Family History  Problem Relation Age of Onset  . Coronary artery disease      Multiple first and second-degree relatives  . Aneurysm      Cerebral circulation  . Hypertension Mother   . Colon cancer Sister   . Clotting disorder      Children diagnosed with hypercoagulable state    History  Substance Use Topics  . Smoking status: Never Smoker   . Smokeless tobacco: Never Used  . Alcohol Use: No    OB History    Grav Para Term Preterm Abortions TAB SAB Ect Mult Living                  Review of Systems  Constitutional: Negative for fever.       10 Systems reviewed and are negative for acute change except as noted in the HPI.  HENT: Negative for congestion.   Eyes: Negative for discharge and redness.  Respiratory: Negative for cough and shortness of breath.   Cardiovascular: Negative for chest pain.  Gastrointestinal: Negative for vomiting and abdominal pain.  Musculoskeletal: Positive for back pain.  Skin: Negative for rash.  Neurological: Negative for syncope, numbness and headaches.  Psychiatric/Behavioral:  No behavior change.    Allergies  Codeine  Home Medications   Current Outpatient Rx  Name  Route  Sig  Dispense  Refill  . ASPIRIN 325 MG PO TABS   Oral   Take 1 tablet (325 mg total) by mouth daily.         . INSULIN GLARGINE 100 UNIT/ML [place] SOLN   Subcutaneous   Inject 60 Units into the skin at bedtime.           . INSULIN GLULISINE 100 UNIT/ML [place] SOLN   Subcutaneous   Inject 20 Units into the skin 3 (three) times daily before meals.          [name] LEVOTHYROXINE SODIUM 25 MCG PO TABS   Oral   Take 25 mcg by mouth every morning.          [name] LORAZEPAM 0.5 MG PO TABS   Oral   Take 0.5 mg by mouth every 4 (four) hours as needed. nerves         . METFORMIN HCL 500 MG PO TABS   Oral   Take 500 mg by mouth 2 (two) times daily with a meal.            . ROSUVASTATIN CALCIUM 10 MG PO TABS   Oral   Take 10 mg by mouth daily.         . TRAMADOL HCL 50 MG PO TABS   Oral   Take 50 mg by mouth every 6 (six) hours as needed.         [name] AMLODIPINE BESYLATE 5 MG PO TABS   Oral   Take 5 mg by mouth every morning.         [name] HYDROCODONE-ACETAMINOPHEN 10-500 MG PO TABS   Oral   Take 0.5-1 tablets by mouth Once daily as needed. For pain         . LISINOPRIL-HYDROCHLOROTHIAZIDE 20-12.5 MG PO TABS   Oral   Take 1 tablet by mouth every morning.         [name] NAPROXEN SODIUM 220 MG PO TABS   Oral   Take 220 mg by mouth 2 (two) times daily as needed.         [name] NITROGLYCERIN 0.4 MG SL SUBL   Sublingual   Place 0.4 mg under the tongue every 5 (five) minutes as needed.           [name] PRAVASTATIN SODIUM 80 MG PO TABS   Oral   Take 80 mg by mouth every evening.         [name] TRIMETHOPRIM 100 MG PO TABS   Oral   Take 100 mg by mouth at bedtime.           BP 152/74  Pulse 65  Temp 97.8 F (36.6 C) (Oral)  Resp 18  Ht 5\' 4"  (1.626 m)  Wt 210 lb (95.255 kg)  BMI 36.05 kg/m2  SpO2 95%  Physical Exam  Nursing note and vitals reviewed. Constitutional:       Awake, alert, nontoxic appearance.  HENT:  Head: Atraumatic.  Eyes: Right eye exhibits no discharge. Left eye exhibits no discharge.  Neck: Neck supple.  Cardiovascular: Normal heart sounds.   Pulmonary/Chest: Effort normal and breath sounds normal. She exhibits no tenderness.  Abdominal: Soft. Bowel sounds are normal. There is no tenderness. There is no rebound.  Musculoskeletal: She exhibits no tenderness.       Baseline ROM, no obvious new focal weakness.No spinal  Tenderness to percussion. No tenderness to palpation of the paraspinal muscles. Negative straight leg raise.  Neurological:       Mental status and motor strength appears baseline for patient and situation.  Skin: No rash noted.  Psychiatric: She has a normal mood and affect.    ED Course    Procedures (including critical care time)      MDM  Patient with chronic back pain that presents with increased pain since Friday. Given antiinflammatory, steroid and analgesic. Minor improvement. Will discharge patient home with Rx for analgesic and steroid. She is to follow up with PCP. Pt stable in ED with no significant deterioration in condition.The patient appears reasonably screened and/or stabilized for discharge and I doubt any other medical condition or other [facility] requiring further screening, evaluation, or treatment in the ED at this time prior to discharge.          Nicoletta Dress. Colon Branch, MD 01/11/13 [phone]

## 2013-01-11 NOTE — ED Notes (Signed)
Pt reporting history of chronic back pain, but reporting pain into left hip and into left thigh.  Reports feeling like left leg is getting weaker.  States pain since Friday.

## 2013-02-12 ENCOUNTER — Other Ambulatory Visit (HOSPITAL_COMMUNITY): Payer: Self-pay | Admitting: Family Medicine

## 2013-02-12 DIAGNOSIS — Z139 Encounter for screening, unspecified: Secondary | ICD-10-CM

## 2013-02-12 DIAGNOSIS — M810 Age-related osteoporosis without current pathological fracture: Secondary | ICD-10-CM

## 2013-03-02 ENCOUNTER — Emergency Department (HOSPITAL_COMMUNITY): Payer: Medicare Other

## 2013-03-02 ENCOUNTER — Encounter (HOSPITAL_COMMUNITY): Payer: Self-pay | Admitting: Emergency Medicine

## 2013-03-02 ENCOUNTER — Emergency Department (HOSPITAL_COMMUNITY)
Admission: EM | Admit: 2013-03-02 | Discharge: 2013-03-02 | Disposition: A | Discharge status: Home / Self Care | Payer: Medicare Other | Attending: Emergency Medicine | Admitting: Emergency Medicine

## 2013-03-02 DIAGNOSIS — I2510 Atherosclerotic heart disease of native coronary artery without angina pectoris: Secondary | ICD-10-CM | POA: Insufficient documentation

## 2013-03-02 DIAGNOSIS — Z7982 Long term (current) use of aspirin: Secondary | ICD-10-CM | POA: Insufficient documentation

## 2013-03-02 DIAGNOSIS — Z794 Long term (current) use of insulin: Secondary | ICD-10-CM | POA: Insufficient documentation

## 2013-03-02 DIAGNOSIS — J4489 Other specified chronic obstructive pulmonary disease: Secondary | ICD-10-CM | POA: Insufficient documentation

## 2013-03-02 DIAGNOSIS — Z8742 Personal history of other diseases of the female genital tract: Secondary | ICD-10-CM | POA: Insufficient documentation

## 2013-03-02 DIAGNOSIS — Z79899 Other long term (current) drug therapy: Secondary | ICD-10-CM | POA: Insufficient documentation

## 2013-03-02 DIAGNOSIS — E0590 Thyrotoxicosis, unspecified without thyrotoxic crisis or storm: Secondary | ICD-10-CM | POA: Insufficient documentation

## 2013-03-02 DIAGNOSIS — G609 Hereditary and idiopathic neuropathy, unspecified: Secondary | ICD-10-CM | POA: Insufficient documentation

## 2013-03-02 DIAGNOSIS — Z8673 Personal history of transient ischemic attack (TIA), and cerebral infarction without residual deficits: Secondary | ICD-10-CM | POA: Insufficient documentation

## 2013-03-02 DIAGNOSIS — S79919A Unspecified injury of unspecified hip, initial encounter: Secondary | ICD-10-CM | POA: Insufficient documentation

## 2013-03-02 DIAGNOSIS — Z8669 Personal history of other diseases of the nervous system and sense organs: Secondary | ICD-10-CM | POA: Insufficient documentation

## 2013-03-02 DIAGNOSIS — Z8739 Personal history of other diseases of the musculoskeletal system and connective tissue: Secondary | ICD-10-CM | POA: Insufficient documentation

## 2013-03-02 DIAGNOSIS — Y929 Unspecified place or not applicable: Secondary | ICD-10-CM | POA: Insufficient documentation

## 2013-03-02 DIAGNOSIS — I10 Essential (primary) hypertension: Secondary | ICD-10-CM | POA: Insufficient documentation

## 2013-03-02 DIAGNOSIS — E119 Type 2 diabetes mellitus without complications: Secondary | ICD-10-CM | POA: Insufficient documentation

## 2013-03-02 DIAGNOSIS — E669 Obesity, unspecified: Secondary | ICD-10-CM | POA: Insufficient documentation

## 2013-03-02 DIAGNOSIS — G8929 Other chronic pain: Secondary | ICD-10-CM | POA: Insufficient documentation

## 2013-03-02 DIAGNOSIS — IMO0002 Reserved for concepts with insufficient information to code with codable children: Secondary | ICD-10-CM | POA: Insufficient documentation

## 2013-03-02 DIAGNOSIS — Z8679 Personal history of other diseases of the circulatory system: Secondary | ICD-10-CM | POA: Insufficient documentation

## 2013-03-02 DIAGNOSIS — W010XXA Fall on same level from slipping, tripping and stumbling without subsequent striking against object, initial encounter: Secondary | ICD-10-CM | POA: Insufficient documentation

## 2013-03-02 DIAGNOSIS — Z951 Presence of aortocoronary bypass graft: Secondary | ICD-10-CM | POA: Insufficient documentation

## 2013-03-02 DIAGNOSIS — Z8781 Personal history of (healed) traumatic fracture: Secondary | ICD-10-CM | POA: Insufficient documentation

## 2013-03-02 DIAGNOSIS — G4733 Obstructive sleep apnea (adult) (pediatric): Secondary | ICD-10-CM | POA: Insufficient documentation

## 2013-03-02 DIAGNOSIS — Y9389 Activity, other specified: Secondary | ICD-10-CM | POA: Insufficient documentation

## 2013-03-02 DIAGNOSIS — Z981 Arthrodesis status: Secondary | ICD-10-CM | POA: Insufficient documentation

## 2013-03-02 MED ORDER — KETOROLAC TROMETHAMINE 60 MG/2ML IM SOLN
60.0000 mg | Freq: Once | INTRAMUSCULAR | Status: AC
  Administered 2013-03-02: 60 mg via INTRAMUSCULAR
  Filled 2013-03-02: qty 2

## 2013-03-02 MED ORDER — HYDROCODONE-ACETAMINOPHEN 5-325 MG PO TABS
1.0000 | ORAL_TABLET | Freq: Once | ORAL | Status: AC
  Administered 2013-03-02: 1 via ORAL
  Filled 2013-03-02: qty 1

## 2013-03-02 MED ORDER — ONDANSETRON 4 MG PO TBDP
4.0000 mg | ORAL_TABLET | Freq: Once | ORAL | Status: AC
  Administered 2013-03-02: 4 mg via ORAL
  Filled 2013-03-02: qty 1

## 2013-03-02 NOTE — ED Notes (Signed)
Pt presents with L hip pain x3 days. States she was getting into bed the other night and she slipped and fell. States pain worsening over last 3 days but was unable to come in. Able to stand but unable to ambulate due to pain.

## 2013-03-02 NOTE — ED Provider Notes (Signed)
History     CSN: [id number]  Arrival date & time 03/02/13  0458   First MD Initiated Contact with Patient 03/02/13 [phone]      Chief Complaint  Patient presents with  . Fall  . Hip Pain    (Consider location/radiation/quality/duration/timing/severity/associated sxs/prior treatment) HPI [name] [name] is a 77 y.o. female  With a h/o COPD, DM, HTN, ASCVD, chronic back pain who presents to the Emergency Department complaining of worsening lower back pain with radiation to the left buttock and leg for the last two days. She is not awareof doing any activity. She did take a fall two days ago and landed on the left hip. She denies fever, chills, nausea, vomiting, urinary symptoms, tingling, numbness, extremity weakness.  PCP Dr. Renard Matter Past Medical History  Diagnosis Date  . Arteriosclerotic cardiovascular disease (ASCVD)      CABG surgery in 2001; nl stress nuclear-2007; angiography in 01/2006- TO of LAD; 70% ostial stenosis      of a small ramus; patent grafts; normal EF; cath in 12/2009 essentially unchanged; EF-66%;  Congestive heart failure with preserved LV systolic function  . Hypertension   . Obstructive sleep apnea   . Cerebrovascular disease     H/o CVA  . Chronic back pain   . Vestibular neuritis     ataxia  . Varicose veins   . Diabetes mellitus     Requires insulin  . Acute renal failure     Possibly contrast-induced  . Obesity   . Chronic obstructive pulmonary disease     Chronic bronchitis  . Fall 2009    right humeral fracture  . Peripheral neuropathy     Left lower extremity; possibly radiculopathy  . Back pain, chronic   . Hyperthyroidism     Past Surgical History  Procedure Laterality Date  . Coronary artery bypass graft  6/01    SVG to diagonal, LIMA to LAD  . Rotator cuff repair    . Appendectomy    . Cholecystectomy    . Total abdominal hysterectomy    . Lumbar spine surgery      X2  . Knee surgery      Arthroscopic  . Carpal tunnel release       Bilateral  . Orif humerus fracture  2009    Family History  Problem Relation Age of Onset  . Coronary artery disease      Multiple first and second-degree relatives  . Aneurysm      Cerebral circulation  . Hypertension Mother   . Colon cancer Sister   . Clotting disorder      Children diagnosed with hypercoagulable state    History  Substance Use Topics  . Smoking status: Never Smoker   . Smokeless tobacco: Never Used  . Alcohol Use: No    OB History   Grav Para Term Preterm Abortions TAB SAB Ect Mult Living                  Review of Systems  Constitutional: Negative for fever.       10 Systems reviewed and are negative for acute change except as noted in the HPI.  HENT: Negative for congestion.   Eyes: Negative for discharge and redness.  Respiratory: Negative for cough and shortness of breath.   Cardiovascular: Negative for chest pain.  Gastrointestinal: Negative for vomiting and abdominal pain.  Musculoskeletal: Positive for back pain.  Skin: Negative for rash.  Neurological: Negative for syncope, numbness and headaches.  Psychiatric/Behavioral:       No behavior change.    Allergies  Codeine  Home Medications   Current Outpatient Rx  Name  Route  Sig  Dispense  Refill  . EXPIRED: amLODipine (NORVASC) 5 MG tablet   Oral   Take 5 mg by mouth every morning.         [name] aspirin 325 MG tablet   Oral   Take 1 tablet (325 mg total) by mouth daily.         [name] HYDROcodone-acetaminophen (LORTAB) 10-500 MG per tablet   Oral   Take 0.5-1 tablets by mouth Once daily as needed. For pain         . insulin glargine (LANTUS SOLOSTAR) 100 UNIT/ML injection   Subcutaneous   Inject 60 Units into the skin at bedtime.           . insulin glulisine (APIDRA) 100 UNIT/ML injection   Subcutaneous   Inject 20 Units into the skin 3 (three) times daily before meals.          [name] levothyroxine (SYNTHROID, LEVOTHROID) 25 MCG tablet   Oral   Take 25 mcg by mouth  every morning.          [name] EXPIRED: lisinopril-hydrochlorothiazide (PRINZIDE,ZESTORETIC) 20-12.5 MG per tablet   Oral   Take 1 tablet by mouth every morning.         [name] LORazepam (ATIVAN) 0.5 MG tablet   Oral   Take 0.5 mg by mouth every 4 (four) hours as needed. nerves         . metFORMIN (GLUCOPHAGE) 500 MG tablet   Oral   Take 500 mg by mouth 2 (two) times daily with a meal.           . naproxen sodium (ALEVE) 220 MG tablet   Oral   Take 220 mg by mouth 2 (two) times daily as needed.         . nitroGLYCERIN (NITROSTAT) 0.4 MG SL tablet   Sublingual   Place 0.4 mg under the tongue every 5 (five) minutes as needed.           [name] EXPIRED: pravastatin (PRAVACHOL) 80 MG tablet   Oral   Take 80 mg by mouth every evening.         . predniSONE (DELTASONE) 10 MG tablet   Oral   Take 2 tablets (20 mg total) by mouth daily.   10 tablet   0   . rosuvastatin (CRESTOR) 10 MG tablet   Oral   Take 10 mg by mouth daily.         . traMADol (ULTRAM) 50 MG tablet   Oral   Take 50 mg by mouth every 6 (six) hours as needed.         . trimethoprim (TRIMPEX) 100 MG tablet   Oral   Take 100 mg by mouth at bedtime.           BP 143/65  Pulse 74  Temp(Src) 98 F (36.7 C) (Oral)  Resp 18  Ht 5\' 4"  (1.626 m)  Wt 190 lb (86.183 kg)  BMI 32.6 kg/m2  SpO2 95%  Physical Exam  Nursing note and vitals reviewed. Constitutional: She appears well-developed and well-nourished.  Awake, alert, nontoxic appearance.  HENT:  Head: Normocephalic and atraumatic.  Eyes: EOM are normal. Pupils are equal, round, and reactive to light. Right eye exhibits no discharge. Left eye exhibits no discharge.  Neck: Normal range of motion. Neck  supple.  Cardiovascular: Normal rate.   Pulmonary/Chest: Effort normal and breath sounds normal. She exhibits no tenderness.  Abdominal: Soft. Bowel sounds are normal. There is no tenderness. There is no rebound.  Musculoskeletal: She exhibits no  tenderness.  Baseline ROM, no obvious new focal weakness.Tenderness to percussion of lower back. No tenderness to palpation of the the paraspinal muscles. Negative straight leg raise on the left.  Neurological:  Mental status and motor strength appears baseline for patient and situation.  Skin: No rash noted.  Psychiatric: She has a normal mood and affect.    ED Course  Procedures (including critical care time)    MDM  Patient with two days of hip pain and lower back pain. Xrays do not show any acute findings. Given analgesic and antiinflammatory with some relief. Reviewed results with patient. Pt stable in ED with no significant deterioration in condition.The patient appears reasonably screened and/or stabilized for discharge and I doubt any other medical condition or other [employer] [facility] [employer] requiring further screening, evaluation, or treatment in the ED at this time prior to discharge.  MDM Reviewed: nursing note and vitals Interpretation: x-ray           Nicoletta Dress. Colon Branch, MD 03/02/13 [phone]

## 2013-03-11 ENCOUNTER — Encounter (HOSPITAL_COMMUNITY): Payer: Self-pay | Admitting: *Deleted

## 2013-03-11 ENCOUNTER — Emergency Department (HOSPITAL_COMMUNITY)
Admission: EM | Admit: 2013-03-11 | Discharge: 2013-03-11 | Disposition: A | Discharge status: Home / Self Care | Payer: Medicare Other | Attending: Emergency Medicine | Admitting: Emergency Medicine

## 2013-03-11 DIAGNOSIS — Z9181 History of falling: Secondary | ICD-10-CM | POA: Insufficient documentation

## 2013-03-11 DIAGNOSIS — Z79899 Other long term (current) drug therapy: Secondary | ICD-10-CM | POA: Insufficient documentation

## 2013-03-11 DIAGNOSIS — Z87448 Personal history of other diseases of urinary system: Secondary | ICD-10-CM | POA: Insufficient documentation

## 2013-03-11 DIAGNOSIS — E119 Type 2 diabetes mellitus without complications: Secondary | ICD-10-CM | POA: Insufficient documentation

## 2013-03-11 DIAGNOSIS — Z7982 Long term (current) use of aspirin: Secondary | ICD-10-CM | POA: Insufficient documentation

## 2013-03-11 DIAGNOSIS — G8929 Other chronic pain: Secondary | ICD-10-CM | POA: Insufficient documentation

## 2013-03-11 DIAGNOSIS — I10 Essential (primary) hypertension: Secondary | ICD-10-CM | POA: Insufficient documentation

## 2013-03-11 DIAGNOSIS — G4733 Obstructive sleep apnea (adult) (pediatric): Secondary | ICD-10-CM | POA: Insufficient documentation

## 2013-03-11 DIAGNOSIS — M5430 Sciatica, unspecified side: Secondary | ICD-10-CM | POA: Insufficient documentation

## 2013-03-11 DIAGNOSIS — E669 Obesity, unspecified: Secondary | ICD-10-CM | POA: Insufficient documentation

## 2013-03-11 DIAGNOSIS — Z8673 Personal history of transient ischemic attack (TIA), and cerebral infarction without residual deficits: Secondary | ICD-10-CM | POA: Insufficient documentation

## 2013-03-11 DIAGNOSIS — E0590 Thyrotoxicosis, unspecified without thyrotoxic crisis or storm: Secondary | ICD-10-CM | POA: Insufficient documentation

## 2013-03-11 DIAGNOSIS — Z794 Long term (current) use of insulin: Secondary | ICD-10-CM | POA: Insufficient documentation

## 2013-03-11 DIAGNOSIS — N390 Urinary tract infection, site not specified: Secondary | ICD-10-CM | POA: Insufficient documentation

## 2013-03-11 DIAGNOSIS — Z8669 Personal history of other diseases of the nervous system and sense organs: Secondary | ICD-10-CM | POA: Insufficient documentation

## 2013-03-11 DIAGNOSIS — Z8679 Personal history of other diseases of the circulatory system: Secondary | ICD-10-CM | POA: Insufficient documentation

## 2013-03-11 DIAGNOSIS — J4489 Other specified chronic obstructive pulmonary disease: Secondary | ICD-10-CM | POA: Insufficient documentation

## 2013-03-11 DIAGNOSIS — R300 Dysuria: Secondary | ICD-10-CM | POA: Insufficient documentation

## 2013-03-11 DIAGNOSIS — Z87828 Personal history of other (healed) physical injury and trauma: Secondary | ICD-10-CM | POA: Insufficient documentation

## 2013-03-11 LAB — URINALYSIS, ROUTINE W REFLEX MICROSCOPIC
Bilirubin Urine: NEGATIVE
Nitrite: NEGATIVE
Specific Gravity, Urine: >1.03 — ABNORMAL HIGH (ref 1.005–1.030)
Urobilinogen, UA: 1 mg/dL (ref 0.0–1.0)
pH: 5.5 (ref 5.0–8.0)

## 2013-03-11 LAB — URINE MICROSCOPIC-ADD ON

## 2013-03-11 MED ORDER — HYDROMORPHONE HCL PF 1 MG/ML IJ SOLN
0.5000 mg | Freq: Once | INTRAMUSCULAR | Status: AC
  Administered 2013-03-11: 0.5 mg via INTRAMUSCULAR
  Filled 2013-03-11: qty 1

## 2013-03-11 MED ORDER — LIDOCAINE HCL (PF) 1 % IJ SOLN
INTRAMUSCULAR | Status: AC
  Filled 2013-03-11: qty 5

## 2013-03-11 MED ORDER — HYDROCODONE-ACETAMINOPHEN 5-325 MG PO TABS: 2.0000 | ORAL_TABLET | ORAL | Status: DC | PRN

## 2013-03-11 MED ORDER — CEFTRIAXONE SODIUM 1 G IJ SOLR
1.0000 g | Freq: Once | INTRAMUSCULAR | Status: AC
  Administered 2013-03-11: 1 g via INTRAMUSCULAR
  Filled 2013-03-11: qty 10

## 2013-03-11 MED ORDER — PROMETHAZINE HCL 25 MG/ML IJ SOLN
12.5000 mg | Freq: Once | INTRAMUSCULAR | Status: AC
  Administered 2013-03-11: 12.5 mg via INTRAMUSCULAR
  Filled 2013-03-11: qty 1

## 2013-03-11 MED ORDER — CEFUROXIME AXETIL 250 MG PO TABS: 250.0000 mg | ORAL_TABLET | Freq: Two times a day (BID) | ORAL | Status: DC

## 2013-03-11 NOTE — ED Notes (Signed)
Low back pain for 1 week, seen here for same.  Dysuria.

## 2013-03-11 NOTE — ED Provider Notes (Signed)
History  This chart was scribed for Gilda Crease, MD by Shari Heritage, [name] The patient was seen in room APA01/APA01. Patient's care was started at 1839.   CSN: [id number]  Arrival date & time 03/11/13  1659   First MD Initiated Contact with Patient 03/11/13 1834      Chief Complaint  Patient presents with  . Back Pain     The history is provided by the patient.     HPI Comments: [name] [name] is a 77 y.o. female who presents to the Emergency Department complaining of recurrent, moderate to severe, constant bilateral lower back pain that radiates down the buttocks. Patient says that the pain has been worsening over the past week, but this is not a new problem.There is associated burning dysuria. Patient denies any obvious injury or trauma to the area. There is no bladder or bowel incontinence. Per medical records, she was seen here for the same issue on 03/02/2013. During this visit, x-rays were negative for acute findings.  Patient ws given  analgesic and antiinflammatory in the ED with some relief and was discharged. She has a medical history of hypertension, obstructive sleep apnea, ASCVD, diabetes, COPD and peripheral neuropathy.     Past Medical History  Diagnosis Date  . Arteriosclerotic cardiovascular disease (ASCVD)      CABG surgery in 2001; nl stress nuclear-2007; angiography in 01/2006- TO of LAD; 70% ostial stenosis      of a small ramus; patent grafts; normal EF; cath in 12/2009 essentially unchanged; EF-66%;  Congestive heart failure with preserved LV systolic function  . Hypertension   . Obstructive sleep apnea   . Cerebrovascular disease     H/o CVA  . Chronic back pain   . Vestibular neuritis     ataxia  . Varicose veins   . Diabetes mellitus     Requires insulin  . Obesity   . Chronic obstructive pulmonary disease     Chronic bronchitis  . Fall 2009    right humeral fracture  . Peripheral neuropathy     Left lower extremity; possibly  radiculopathy  . Back pain, chronic   . Hyperthyroidism   . Acute renal failure     Possibly contrast-induced    Past Surgical History  Procedure Laterality Date  . Coronary artery bypass graft  6/01    SVG to diagonal, LIMA to LAD  . Rotator cuff repair    . Appendectomy    . Cholecystectomy    . Total abdominal hysterectomy    . Lumbar spine surgery      X2  . Knee surgery      Arthroscopic  . Carpal tunnel release      Bilateral  . Orif humerus fracture  2009    Family History  Problem Relation Age of Onset  . Coronary artery disease      Multiple first and second-degree relatives  . Aneurysm      Cerebral circulation  . Hypertension Mother   . Colon cancer Sister   . Clotting disorder      Children diagnosed with hypercoagulable state    History  Substance Use Topics  . Smoking status: Never Smoker   . Smokeless tobacco: Never Used  . Alcohol Use: No    OB History   Grav Para Term Preterm Abortions TAB SAB Ect Mult Living                  Review of Systems  Musculoskeletal:  Positive for back pain.    Allergies  Codeine  Home Medications   Current Outpatient Rx  Name  Route  Sig  Dispense  Refill  . aspirin 325 MG tablet   Oral   Take 1 tablet (325 mg total) by mouth daily.         . insulin glargine (LANTUS SOLOSTAR) 100 UNIT/ML injection   Subcutaneous   Inject 60 Units into the skin at bedtime.           . insulin glulisine (APIDRA) 100 UNIT/ML injection   Subcutaneous   Inject 20 Units into the skin 3 (three) times daily before meals.          [name] levothyroxine (SYNTHROID, LEVOTHROID) 25 MCG tablet   Oral   Take 25 mcg by mouth every morning.          [name] lisinopril-hydrochlorothiazide (PRINZIDE,ZESTORETIC) 20-12.5 MG per tablet   Oral   Take 1 tablet by mouth daily.         [name] LORazepam (ATIVAN) 0.5 MG tablet   Oral   Take 0.5 mg by mouth every 4 (four) hours as needed. nerves         . metFORMIN (GLUCOPHAGE) 500 MG  tablet   Oral   Take 500 mg by mouth 2 (two) times daily with a meal.           . rosuvastatin (CRESTOR) 10 MG tablet   Oral   Take 10 mg by mouth daily.         . traMADol (ULTRAM) 50 MG tablet   Oral   Take 50 mg by mouth every 6 (six) hours as needed for pain.          [name] trimethoprim (TRIMPEX) 100 MG tablet   Oral   Take 100 mg by mouth at bedtime.         . nitroGLYCERIN (NITROSTAT) 0.4 MG SL tablet   Sublingual   Place 0.4 mg under the tongue every 5 (five) minutes as needed for chest pain.            Triage Vitals: BP 165/70  Pulse 77  Temp(Src) 98.1 F (36.7 C) (Oral)  Resp 20  Ht 5\' 4"  (1.626 m)  Wt 190 lb (86.183 kg)  BMI 32.6 kg/m2  SpO2 97%  Physical Exam  Constitutional: She is oriented to person, place, and time. She appears well-developed and well-nourished. No distress.  HENT:  Head: Normocephalic and atraumatic.  Right Ear: Hearing normal.  Nose: Nose normal.  Mouth/Throat: Oropharynx is clear and moist and mucous membranes are normal.  Eyes: Conjunctivae and EOM are normal. Pupils are equal, round, and reactive to light.  Neck: Normal range of motion. Neck supple.  Cardiovascular: Normal rate, regular rhythm, S1 normal and S2 normal.  Exam reveals no gallop and no friction rub.   No murmur heard. Pulmonary/Chest: Effort normal and breath sounds normal. No respiratory distress. She exhibits no tenderness.  Abdominal: Soft. Normal appearance and bowel sounds are normal. There is no hepatosplenomegaly. There is no tenderness. There is no rebound, no guarding, no tenderness at McBurney's point and negative Murphy's sign. No hernia.  Musculoskeletal: Normal range of motion.       Lumbar back: She exhibits tenderness.  Neurological: She is alert and oriented to person, place, and time. She has normal strength. No cranial nerve deficit or sensory deficit. Coordination normal. GCS eye subscore is 4. GCS verbal subscore is 5. GCS motor subscore is 6.  Skin: Skin is warm, dry and intact. No rash noted. No cyanosis.  Psychiatric: She has a normal mood and affect. Her speech is normal and behavior is normal. Thought content normal.    ED Course  Procedures (including critical care time) DIAGNOSTIC STUDIES: Oxygen Saturation is 97% on room air, adequate by my interpretation.    COORDINATION OF CARE: 6:56 PM- Patient informed of current plan for treatment and evaluation and agrees with plan at this time.      Labs Reviewed  URINALYSIS, ROUTINE W REFLEX MICROSCOPIC - Abnormal; Notable for the following:    APPearance HAZY (*)    Specific Gravity, Urine >1.030 (*)    Glucose, UA 500 (*)    Protein, ur TRACE (*)    Leukocytes, UA TRACE (*)    All other components within normal limits  URINE MICROSCOPIC-ADD ON - Abnormal; Notable for the following:    Squamous Epithelial / LPF FEW (*)    Bacteria, UA FEW (*)    All other components within normal limits  URINE CULTURE   No results found.   Diagnosis: 1. UTI 2. Sciatica    MDM  Patient comes to the ER for evaluation of low back pain. Patient reports that she has been having this pain for a couple of months. Pain has progressively worsened. It radiates from the lower back down into both hip and buttock regions bilaterally. No lower extremity numbness, tingling or weakness. She has normal strength in the lower extremities. No neurologic deficit. History and examination are consistent with sciatica. She is appropriate for followup with primary care Dr. for this issue.  Patient reports that in the last several days to weeks time, now she is experiencing pain with urination and small urine volumes. She reports that she has had recurrent urinary tract infections in the past. Has not been any fever. No nausea or vomiting. Urinalysis is consistent with infection. Patient administered Rocephin here in the ER, will be prescribed Ceftin.  Patient is to followup with primary Dr. in the office  early next week for a recheck.  I personally performed the services described in this documentation, which was scribed in my presence. The recorded information has been reviewed and is accurate.   Gilda Crease, MD 03/11/13 [phone]

## 2013-03-11 NOTE — ED Notes (Signed)
Pt asking something for back pain, edp aware

## 2013-03-11 NOTE — ED Notes (Signed)
Pt states lower back pain, radiating down left thigh. Pain x 2 mo. Burning with urination.

## 2013-03-12 LAB — URINE CULTURE

## 2013-04-02 ENCOUNTER — Ambulatory Visit (INDEPENDENT_AMBULATORY_CARE_PROVIDER_SITE_OTHER): Payer: Medicare Other | Admitting: Urology

## 2013-04-02 DIAGNOSIS — N3020 Other chronic cystitis without hematuria: Secondary | ICD-10-CM

## 2013-04-02 DIAGNOSIS — N3941 Urge incontinence: Secondary | ICD-10-CM

## 2013-04-02 DIAGNOSIS — B373 Candidiasis of vulva and vagina: Secondary | ICD-10-CM

## 2013-05-07 ENCOUNTER — Ambulatory Visit (INDEPENDENT_AMBULATORY_CARE_PROVIDER_SITE_OTHER): Payer: Medicare Other | Admitting: Urology

## 2013-05-07 DIAGNOSIS — N3020 Other chronic cystitis without hematuria: Secondary | ICD-10-CM

## 2013-05-07 DIAGNOSIS — N952 Postmenopausal atrophic vaginitis: Secondary | ICD-10-CM

## 2013-05-07 DIAGNOSIS — R300 Dysuria: Secondary | ICD-10-CM

## 2013-08-06 ENCOUNTER — Ambulatory Visit (INDEPENDENT_AMBULATORY_CARE_PROVIDER_SITE_OTHER): Payer: Medicare Other | Admitting: Urology

## 2013-08-06 DIAGNOSIS — N952 Postmenopausal atrophic vaginitis: Secondary | ICD-10-CM

## 2013-08-06 DIAGNOSIS — N3020 Other chronic cystitis without hematuria: Secondary | ICD-10-CM

## 2013-08-06 DIAGNOSIS — N3941 Urge incontinence: Secondary | ICD-10-CM

## 2013-08-16 ENCOUNTER — Ambulatory Visit (HOSPITAL_COMMUNITY)
Admission: RE | Admit: 2013-08-16 | Discharge: 2013-08-16 | Disposition: A | Discharge status: Home / Self Care | Payer: Medicare Other | Source: Ambulatory Visit | Attending: Family Medicine | Admitting: Family Medicine

## 2013-08-16 DIAGNOSIS — Z1231 Encounter for screening mammogram for malignant neoplasm of breast: Secondary | ICD-10-CM | POA: Insufficient documentation

## 2013-08-16 DIAGNOSIS — Z139 Encounter for screening, unspecified: Secondary | ICD-10-CM

## 2013-09-01 IMAGING — CR DG CHEST 1V PORT
1 series · 1 of 1 positions shown · non-contrast
Comparison: 03/14/2011

CLINICAL DATA: Chest pain, shortness of breath

PORTABLE CHEST - 1 VIEW

[view not recorded]
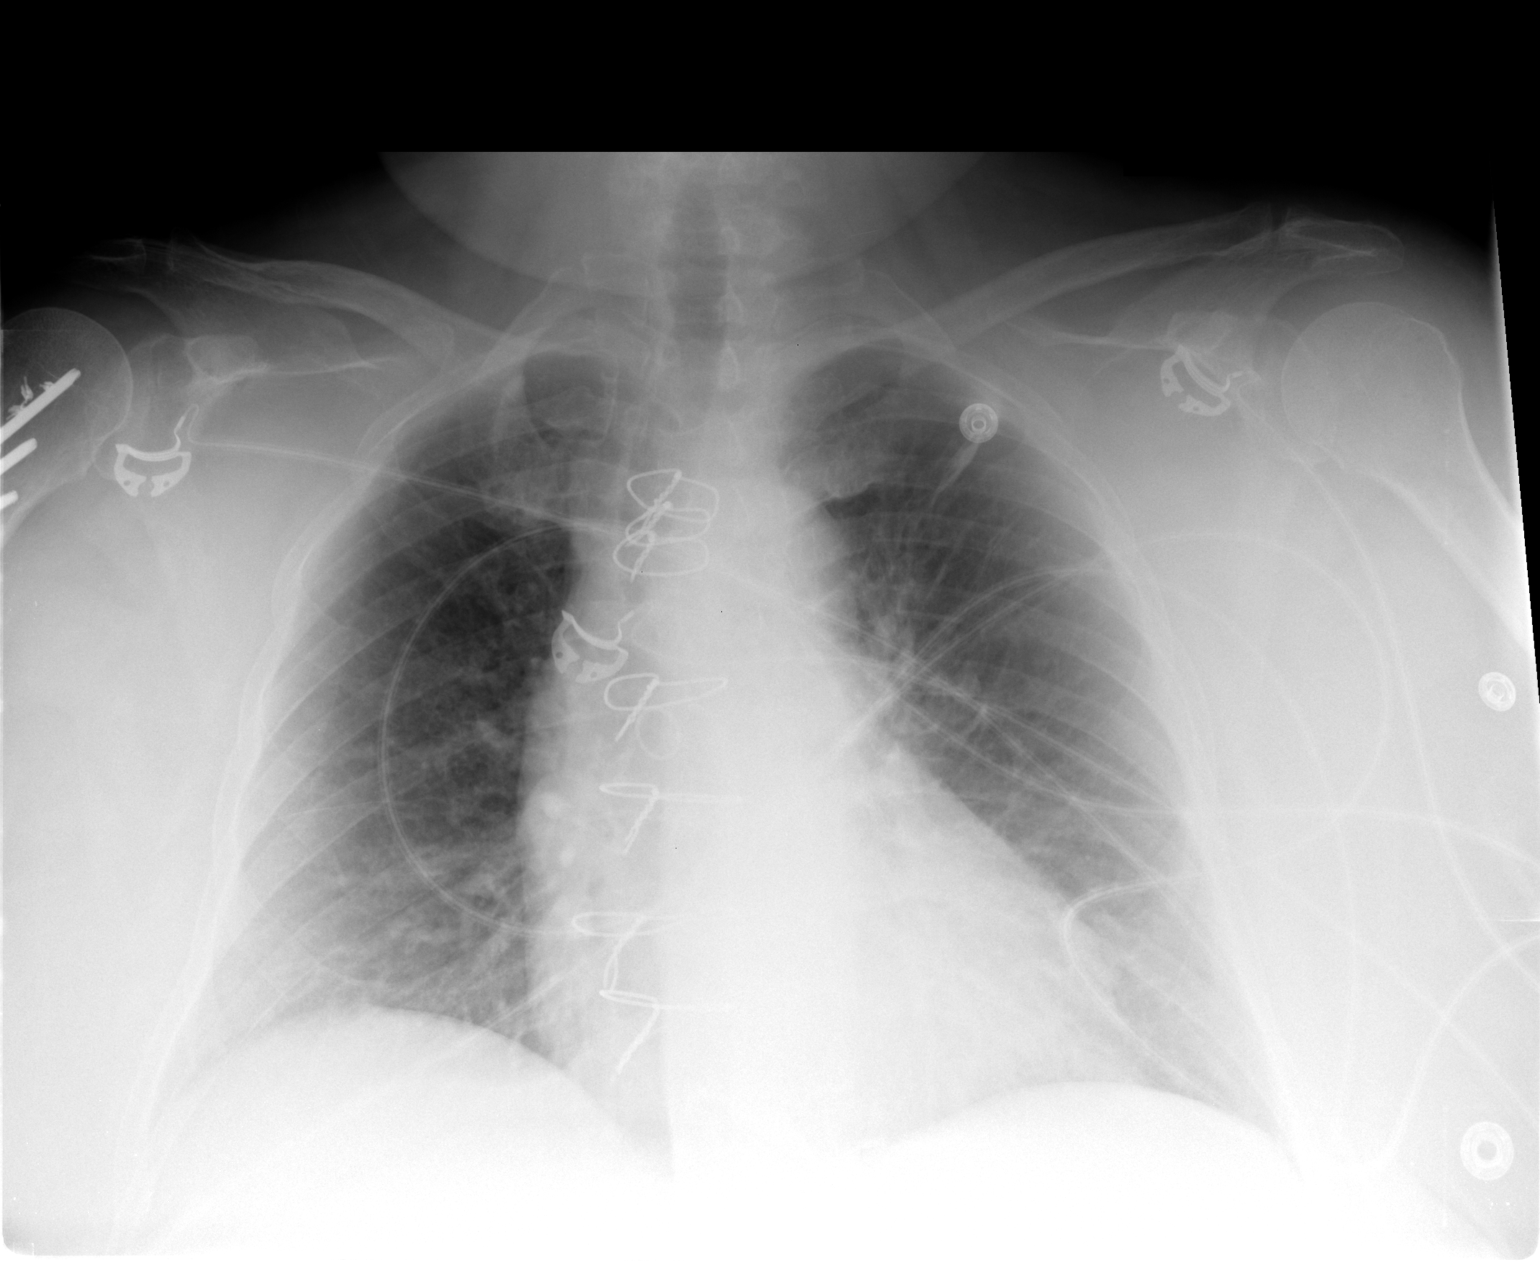

[1 of 1 positions shown; findings below may reference images not displayed]

FINDINGS: Previous CABG.  Fixation hardware in the right humerus
partially seen.  Low lung volumes.  No confluent airspace
infiltrate or overt edema.  Heart size upper limits normal.  No
effusion.
IMPRESSION: 1.  Stable postoperative changes.  No acute disease.

## 2013-09-10 ENCOUNTER — Ambulatory Visit (INDEPENDENT_AMBULATORY_CARE_PROVIDER_SITE_OTHER): Payer: Medicare Other | Admitting: Urology

## 2013-09-10 DIAGNOSIS — N3941 Urge incontinence: Secondary | ICD-10-CM

## 2013-09-10 DIAGNOSIS — N3020 Other chronic cystitis without hematuria: Secondary | ICD-10-CM

## 2013-09-10 DIAGNOSIS — R339 Retention of urine, unspecified: Secondary | ICD-10-CM

## 2013-12-09 ENCOUNTER — Encounter (HOSPITAL_COMMUNITY): Payer: Self-pay | Admitting: Emergency Medicine

## 2013-12-09 ENCOUNTER — Emergency Department (HOSPITAL_COMMUNITY)
Admission: EM | Admit: 2013-12-09 | Discharge: 2013-12-09 | Disposition: A | Discharge status: Home / Self Care | Payer: Medicare Other | Attending: Emergency Medicine | Admitting: Emergency Medicine

## 2013-12-09 ENCOUNTER — Emergency Department (HOSPITAL_COMMUNITY): Payer: Medicare Other

## 2013-12-09 DIAGNOSIS — Z792 Long term (current) use of antibiotics: Secondary | ICD-10-CM | POA: Insufficient documentation

## 2013-12-09 DIAGNOSIS — J4489 Other specified chronic obstructive pulmonary disease: Secondary | ICD-10-CM | POA: Insufficient documentation

## 2013-12-09 DIAGNOSIS — N390 Urinary tract infection, site not specified: Secondary | ICD-10-CM | POA: Insufficient documentation

## 2013-12-09 DIAGNOSIS — E669 Obesity, unspecified: Secondary | ICD-10-CM | POA: Insufficient documentation

## 2013-12-09 DIAGNOSIS — Z79899 Other long term (current) drug therapy: Secondary | ICD-10-CM | POA: Insufficient documentation

## 2013-12-09 DIAGNOSIS — Z8673 Personal history of transient ischemic attack (TIA), and cerebral infarction without residual deficits: Secondary | ICD-10-CM | POA: Insufficient documentation

## 2013-12-09 DIAGNOSIS — Z794 Long term (current) use of insulin: Secondary | ICD-10-CM | POA: Insufficient documentation

## 2013-12-09 DIAGNOSIS — Z951 Presence of aortocoronary bypass graft: Secondary | ICD-10-CM | POA: Insufficient documentation

## 2013-12-09 DIAGNOSIS — J449 Chronic obstructive pulmonary disease, unspecified: Secondary | ICD-10-CM | POA: Insufficient documentation

## 2013-12-09 DIAGNOSIS — I10 Essential (primary) hypertension: Secondary | ICD-10-CM | POA: Insufficient documentation

## 2013-12-09 DIAGNOSIS — Z8781 Personal history of (healed) traumatic fracture: Secondary | ICD-10-CM | POA: Insufficient documentation

## 2013-12-09 DIAGNOSIS — E119 Type 2 diabetes mellitus without complications: Secondary | ICD-10-CM | POA: Insufficient documentation

## 2013-12-09 DIAGNOSIS — I509 Heart failure, unspecified: Secondary | ICD-10-CM | POA: Insufficient documentation

## 2013-12-09 DIAGNOSIS — E0590 Thyrotoxicosis, unspecified without thyrotoxic crisis or storm: Secondary | ICD-10-CM | POA: Insufficient documentation

## 2013-12-09 DIAGNOSIS — G8929 Other chronic pain: Secondary | ICD-10-CM | POA: Insufficient documentation

## 2013-12-09 LAB — URINALYSIS, ROUTINE W REFLEX MICROSCOPIC
Nitrite: POSITIVE — AB
Protein, ur: 100 mg/dL — AB
Urobilinogen, UA: >8 mg/dL — ABNORMAL HIGH (ref 0.0–1.0)

## 2013-12-09 LAB — COMPREHENSIVE METABOLIC PANEL
ALT: 15 U/L (ref 0–35)
AST: 17 U/L (ref 0–37)
CO2: 27 mEq/L (ref 19–32)
Calcium: 8.9 mg/dL (ref 8.4–10.5)
Chloride: 101 mEq/L (ref 96–112)
Creatinine, Ser: 0.75 mg/dL (ref 0.50–1.10)
GFR calc Af Amer: >90 mL/min (ref >90)
GFR calc non Af Amer: 81 mL/min — ABNORMAL LOW (ref >90)
Glucose, Bld: 86 mg/dL (ref 70–99)
Total Bilirubin: 0.3 mg/dL (ref 0.3–1.2)

## 2013-12-09 LAB — CBC WITH DIFFERENTIAL/PLATELET
Basophils Absolute: 0 10*3/uL (ref 0.0–0.1)
Eosinophils Relative: 3 % (ref 0–5)
HCT: 38.5 % (ref 36.0–46.0)
Hemoglobin: 12.1 g/dL (ref 12.0–15.0)
Lymphocytes Relative: 19 % (ref 12–46)
Lymphs Abs: 1.9 10*3/uL (ref 0.7–4.0)
MCV: 82.4 fL (ref 78.0–100.0)
Monocytes Absolute: 1 10*3/uL (ref 0.1–1.0)
Neutro Abs: 7 10*3/uL (ref 1.7–7.7)
RBC: 4.67 MIL/uL (ref 3.87–5.11)
RDW: 15.1 % (ref 11.5–15.5)
WBC: 10.2 10*3/uL (ref 4.0–10.5)

## 2013-12-09 MED ORDER — ONDANSETRON 4 MG PO TBDP: ORAL_TABLET | ORAL | Status: DC

## 2013-12-09 MED ORDER — CEPHALEXIN 500 MG PO CAPS: 500.0000 mg | ORAL_CAPSULE | Freq: Four times a day (QID) | ORAL | Status: DC

## 2013-12-09 MED ORDER — DEXTROSE 5 % IV SOLN
1.0000 g | Freq: Once | INTRAVENOUS | Status: AC
  Administered 2013-12-09: 1 g via INTRAVENOUS
  Filled 2013-12-09: qty 10

## 2013-12-09 MED ORDER — HYDROCODONE-ACETAMINOPHEN 5-325 MG PO TABS
ORAL_TABLET | ORAL | Status: AC
  Filled 2013-12-09: qty 1

## 2013-12-09 MED ORDER — SODIUM CHLORIDE 0.9 % IV BOLUS (SEPSIS)
500.0000 mL | Freq: Once | INTRAVENOUS | Status: AC
  Administered 2013-12-09: 02:00:00 via INTRAVENOUS

## 2013-12-09 MED ORDER — ONDANSETRON HCL 4 MG/2ML IJ SOLN
4.0000 mg | Freq: Once | INTRAMUSCULAR | Status: AC
  Administered 2013-12-09: 4 mg via INTRAVENOUS
  Filled 2013-12-09: qty 2

## 2013-12-09 MED ORDER — HYDROCODONE-ACETAMINOPHEN 5-325 MG PO TABS
1.0000 | ORAL_TABLET | Freq: Once | ORAL | Status: AC
  Administered 2013-12-09: 1 via ORAL

## 2013-12-09 NOTE — ED Notes (Signed)
Pt c/o nausea and vomiting. Pt states she vomited once yesterday morning.

## 2013-12-09 NOTE — ED Provider Notes (Signed)
CSN: [id number]     Arrival date & time 12/09/13  0047 History   First MD Initiated Contact with Patient 12/09/13 0100     Chief Complaint  Patient presents with  . Nausea  . Emesis   (Consider location/radiation/quality/duration/timing/severity/associated sxs/prior Treatment) Patient is a 77 y.o. female presenting with vomiting. The history is provided by the patient (pt complains of vomiting).  Emesis Severity:  Moderate Timing:  Intermittent Quality:  Undigested food Able to tolerate:  Liquids Progression:  Unchanged Chronicity:  Recurrent Recent urination:  Normal Relieved by:  Nothing Associated symptoms: no abdominal pain, no diarrhea and no headaches     Past Medical History  Diagnosis Date  . Arteriosclerotic cardiovascular disease (ASCVD)      CABG surgery in 2001; nl stress nuclear-2007; angiography in 01/2006- TO of LAD; 70% ostial stenosis      of a small ramus; patent grafts; normal EF; cath in 12/2009 essentially unchanged; EF-66%;  Congestive heart failure with preserved LV systolic function  . Hypertension   . Obstructive sleep apnea   . Cerebrovascular disease     H/o CVA  . Chronic back pain   . Vestibular neuritis     ataxia  . Varicose veins   . Diabetes mellitus     Requires insulin  . Obesity   . Chronic obstructive pulmonary disease     Chronic bronchitis  . Fall 2009    right humeral fracture  . Peripheral neuropathy     Left lower extremity; possibly radiculopathy  . Back pain, chronic   . Hyperthyroidism   . Acute renal failure     Possibly contrast-induced   Past Surgical History  Procedure Laterality Date  . Coronary artery bypass graft  6/01    SVG to diagonal, LIMA to LAD  . Rotator cuff repair    . Appendectomy    . Cholecystectomy    . Total abdominal hysterectomy    . Lumbar spine surgery      X2  . Knee surgery      Arthroscopic  . Carpal tunnel release      Bilateral  . Orif humerus fracture  2009   Family History   Problem Relation Age of Onset  . Coronary artery disease      Multiple first and second-degree relatives  . Aneurysm      Cerebral circulation  . Hypertension Mother   . Colon cancer Sister   . Clotting disorder      Children diagnosed with hypercoagulable state   History  Substance Use Topics  . Smoking status: Never Smoker   . Smokeless tobacco: Never Used  . Alcohol Use: No   OB History   Grav Para Term Preterm Abortions TAB SAB Ect Mult Living                 Review of Systems  Constitutional: Negative for appetite change and fatigue.  HENT: Negative for congestion, ear discharge and sinus pressure.   Eyes: Negative for discharge.  Respiratory: Negative for cough.   Cardiovascular: Negative for chest pain.  Gastrointestinal: Positive for vomiting. Negative for abdominal pain and diarrhea.  Genitourinary: Negative for frequency and hematuria.  Musculoskeletal: Negative for back pain.  Skin: Negative for rash.  Neurological: Negative for seizures and headaches.  Psychiatric/Behavioral: Negative for hallucinations.    Allergies  Codeine  Home Medications   Current Outpatient Rx  Name  Route  Sig  Dispense  Refill  . ciprofloxacin (CIPRO) 500 MG tablet  Oral   Take 500 mg by mouth 2 (two) times daily.         [name] HYDROcodone-acetaminophen (NORCO/VICODIN) 5-325 MG per tablet   Oral   Take 2 tablets by mouth every 4 (four) hours as needed for pain.   10 tablet   0   . insulin glargine (LANTUS SOLOSTAR) 100 UNIT/ML injection   Subcutaneous   Inject 60 Units into the skin at bedtime.           . insulin glulisine (APIDRA) 100 UNIT/ML injection   Subcutaneous   Inject 20 Units into the skin 3 (three) times daily before meals.          [name] levothyroxine (SYNTHROID, LEVOTHROID) 25 MCG tablet   Oral   Take 25 mcg by mouth every morning.          [name] lisinopril-hydrochlorothiazide (PRINZIDE,ZESTORETIC) 20-12.5 MG per tablet   Oral   Take 1 tablet by mouth  daily.         [name] LORazepam (ATIVAN) 0.5 MG tablet   Oral   Take 0.5 mg by mouth every 4 (four) hours as needed. nerves         . metFORMIN (GLUCOPHAGE) 500 MG tablet   Oral   Take 500 mg by mouth 2 (two) times daily with a meal.           . nitroGLYCERIN (NITROSTAT) 0.4 MG SL tablet   Sublingual   Place 0.4 mg under the tongue every 5 (five) minutes as needed for chest pain.          . phenazopyridine (PYRIDIUM) 100 MG tablet   Oral   Take 100 mg by mouth 3 (three) times daily as needed for pain.         . rosuvastatin (CRESTOR) 10 MG tablet   Oral   Take 10 mg by mouth daily.         . traMADol (ULTRAM) 50 MG tablet   Oral   Take 50 mg by mouth every 6 (six) hours as needed for pain.          [name] trimethoprim (TRIMPEX) 100 MG tablet   Oral   Take 100 mg by mouth at bedtime.         . cefUROXime (CEFTIN) 250 MG tablet   Oral   Take 1 tablet (250 mg total) by mouth 2 (two) times daily.   20 tablet   0   . cephALEXin (KEFLEX) 500 MG capsule   Oral   Take 1 capsule (500 mg total) by mouth 4 (four) times daily.   28 capsule   0   . ondansetron (ZOFRAN ODT) 4 MG disintegrating tablet      4mg  ODT q6 hours prn nausea/vomit   12 tablet   0    BP 126/61  Pulse 89  Temp(Src) 98.8 F (37.1 C) (Oral)  Resp 20  Ht 5\' 4"  (1.626 m)  Wt 210 lb (95.255 kg)  BMI 36.03 kg/m2  SpO2 94% Physical Exam  Constitutional: She is oriented to person, place, and time. She appears well-developed.  HENT:  Head: Normocephalic.  Eyes: Conjunctivae and EOM are normal. No scleral icterus.  Neck: Neck supple. No thyromegaly present.  Cardiovascular: Normal rate and regular rhythm.  Exam reveals no gallop and no friction rub.   No murmur heard. Pulmonary/Chest: No stridor. She has no wheezes. She has no rales. She exhibits no tenderness.  Abdominal: She exhibits no distension. There is no tenderness. There  is no rebound.  Musculoskeletal: Normal range of motion. She  exhibits no edema.  Lymphadenopathy:    She has no cervical adenopathy.  Neurological: She is oriented to person, place, and time. She exhibits normal muscle tone. Coordination normal.  Skin: No rash noted. No erythema.  Psychiatric: She has a normal mood and affect. Her behavior is normal.    ED Course  Procedures (including critical care time) Labs Review Labs Reviewed  CBC WITH DIFFERENTIAL - Abnormal; Notable for the following:    MCH 25.9 (*)    All other components within normal limits  COMPREHENSIVE METABOLIC PANEL - Abnormal; Notable for the following:    Potassium 3.3 (*)    Albumin 3.2 (*)    GFR calc non Af Amer 81 (*)    All other components within normal limits  URINALYSIS, ROUTINE W REFLEX MICROSCOPIC - Abnormal; Notable for the following:    Color, Urine ORANGE (*)    APPearance HAZY (*)    Glucose, UA 100 (*)    Hgb urine dipstick TRACE (*)    Bilirubin Urine SMALL (*)    Ketones, ur TRACE (*)    Protein, ur 100 (*)    Urobilinogen, UA >8.0 (*)    Nitrite POSITIVE (*)    Leukocytes, UA MODERATE (*)    All other components within normal limits  URINE MICROSCOPIC-ADD ON - Abnormal; Notable for the following:    Squamous Epithelial / LPF MANY (*)    Bacteria, UA MANY (*)    All other components within normal limits  URINE CULTURE   Imaging Review Dg Chest 2 View  12/09/2013   CLINICAL DATA:  Nausea and emesis  EXAM: CHEST  2 VIEW  COMPARISON:  07/19/2012  FINDINGS: No cardiomegaly. Coronary artery atherosclerosis, status post CABG. No acute infiltrate or edema. No effusion or pneumothorax. Chronic left upper lobe scar. The status post ORIF of the proximal right humerus.  IMPRESSION: No active cardiopulmonary disease.   Electronically Signed   By: Tiburcio Pea M.D.   On: 12/09/2013 02:07    EKG Interpretation   None       MDM   1. UTI (lower urinary tract infection)        Benny Lennert, MD 12/09/13 [phone]

## 2013-12-10 ENCOUNTER — Emergency Department (HOSPITAL_COMMUNITY): Payer: Medicare Other

## 2013-12-10 ENCOUNTER — Inpatient Hospital Stay (HOSPITAL_COMMUNITY)
Admission: EM | Admit: 2013-12-10 | Discharge: 2013-12-14 | DRG: 191 | Disposition: A | Discharge status: Home / Self Care | Payer: Medicare Other | Attending: Family Medicine | Admitting: Family Medicine

## 2013-12-10 ENCOUNTER — Encounter (HOSPITAL_COMMUNITY): Payer: Self-pay | Admitting: Emergency Medicine

## 2013-12-10 ENCOUNTER — Inpatient Hospital Stay (HOSPITAL_COMMUNITY): Payer: Medicare Other

## 2013-12-10 DIAGNOSIS — Z9119 Patient's noncompliance with other medical treatment and regimen: Secondary | ICD-10-CM

## 2013-12-10 DIAGNOSIS — E118 Type 2 diabetes mellitus with unspecified complications: Secondary | ICD-10-CM | POA: Diagnosis present

## 2013-12-10 DIAGNOSIS — Z794 Long term (current) use of insulin: Secondary | ICD-10-CM

## 2013-12-10 DIAGNOSIS — E119 Type 2 diabetes mellitus without complications: Secondary | ICD-10-CM | POA: Diagnosis present

## 2013-12-10 DIAGNOSIS — I10 Essential (primary) hypertension: Secondary | ICD-10-CM | POA: Diagnosis present

## 2013-12-10 DIAGNOSIS — E669 Obesity, unspecified: Secondary | ICD-10-CM | POA: Diagnosis present

## 2013-12-10 DIAGNOSIS — I2510 Atherosclerotic heart disease of native coronary artery without angina pectoris: Secondary | ICD-10-CM | POA: Diagnosis present

## 2013-12-10 DIAGNOSIS — G459 Transient cerebral ischemic attack, unspecified: Secondary | ICD-10-CM | POA: Diagnosis present

## 2013-12-10 DIAGNOSIS — L409 Psoriasis, unspecified: Secondary | ICD-10-CM | POA: Diagnosis present

## 2013-12-10 DIAGNOSIS — J4489 Other specified chronic obstructive pulmonary disease: Principal | ICD-10-CM | POA: Diagnosis present

## 2013-12-10 DIAGNOSIS — Z800 Family history of malignant neoplasm of digestive organs: Secondary | ICD-10-CM

## 2013-12-10 DIAGNOSIS — I509 Heart failure, unspecified: Secondary | ICD-10-CM | POA: Diagnosis present

## 2013-12-10 DIAGNOSIS — G4733 Obstructive sleep apnea (adult) (pediatric): Secondary | ICD-10-CM | POA: Diagnosis present

## 2013-12-10 DIAGNOSIS — Z951 Presence of aortocoronary bypass graft: Secondary | ICD-10-CM

## 2013-12-10 DIAGNOSIS — N390 Urinary tract infection, site not specified: Secondary | ICD-10-CM | POA: Diagnosis present

## 2013-12-10 DIAGNOSIS — Z6836 Body mass index (BMI) 36.0-36.9, adult: Secondary | ICD-10-CM

## 2013-12-10 DIAGNOSIS — B379 Candidiasis, unspecified: Secondary | ICD-10-CM | POA: Diagnosis present

## 2013-12-10 DIAGNOSIS — Z8249 Family history of ischemic heart disease and other diseases of the circulatory system: Secondary | ICD-10-CM

## 2013-12-10 DIAGNOSIS — G8929 Other chronic pain: Secondary | ICD-10-CM | POA: Diagnosis present

## 2013-12-10 DIAGNOSIS — Z91199 Patient's noncompliance with other medical treatment and regimen due to unspecified reason: Secondary | ICD-10-CM

## 2013-12-10 DIAGNOSIS — G609 Hereditary and idiopathic neuropathy, unspecified: Secondary | ICD-10-CM | POA: Diagnosis present

## 2013-12-10 DIAGNOSIS — E039 Hypothyroidism, unspecified: Secondary | ICD-10-CM | POA: Diagnosis present

## 2013-12-10 DIAGNOSIS — J449 Chronic obstructive pulmonary disease, unspecified: Principal | ICD-10-CM | POA: Diagnosis present

## 2013-12-10 DIAGNOSIS — M549 Dorsalgia, unspecified: Secondary | ICD-10-CM | POA: Diagnosis present

## 2013-12-10 DIAGNOSIS — E860 Dehydration: Secondary | ICD-10-CM | POA: Diagnosis present

## 2013-12-10 DIAGNOSIS — L408 Other psoriasis: Secondary | ICD-10-CM | POA: Diagnosis present

## 2013-12-10 DIAGNOSIS — G4730 Sleep apnea, unspecified: Secondary | ICD-10-CM | POA: Diagnosis present

## 2013-12-10 DIAGNOSIS — R0902 Hypoxemia: Secondary | ICD-10-CM | POA: Diagnosis present

## 2013-12-10 DIAGNOSIS — R531 Weakness: Secondary | ICD-10-CM | POA: Diagnosis present

## 2013-12-10 DIAGNOSIS — Z8673 Personal history of transient ischemic attack (TIA), and cerebral infarction without residual deficits: Secondary | ICD-10-CM

## 2013-12-10 LAB — URINE CULTURE: Colony Count: 5000

## 2013-12-10 LAB — HEMOGLOBIN A1C
Hgb A1c MFr Bld: 8.8 % — ABNORMAL HIGH (ref <5.7)
Mean Plasma Glucose: 206 mg/dL — ABNORMAL HIGH (ref <117)

## 2013-12-10 LAB — CG4 I-STAT (LACTIC ACID): Lactic Acid, Venous: 0.95 mmol/L (ref 0.5–2.2)

## 2013-12-10 LAB — BASIC METABOLIC PANEL
BUN: 10 mg/dL (ref 6–23)
CO2: 29 mEq/L (ref 19–32)
Calcium: 8.8 mg/dL (ref 8.4–10.5)
GFR calc Af Amer: >90 mL/min (ref >90)
GFR calc non Af Amer: 82 mL/min — ABNORMAL LOW (ref >90)
Potassium: 4 mEq/L (ref 3.5–5.1)
Sodium: 136 mEq/L (ref 135–145)

## 2013-12-10 LAB — GLUCOSE, CAPILLARY
Glucose-Capillary: 146 mg/dL — ABNORMAL HIGH (ref 70–99)
Glucose-Capillary: 140 mg/dL — ABNORMAL HIGH (ref 70–99)
Glucose-Capillary: 211 mg/dL — ABNORMAL HIGH (ref 70–99)
Glucose-Capillary: 185 mg/dL — ABNORMAL HIGH (ref 70–99)

## 2013-12-10 LAB — CBC WITH DIFFERENTIAL/PLATELET
Basophils Relative: 0 % (ref 0–1)
Eosinophils Absolute: 0 10*3/uL (ref 0.0–0.7)
Hemoglobin: 12 g/dL (ref 12.0–15.0)
Lymphs Abs: 1.4 10*3/uL (ref 0.7–4.0)
MCH: 25.9 pg — ABNORMAL LOW (ref 26.0–34.0)
MCV: 83 fL (ref 78.0–100.0)
Monocytes Relative: 18 % — ABNORMAL HIGH (ref 3–12)
Neutro Abs: 4.5 10*3/uL (ref 1.7–7.7)
Neutrophils Relative %: 63 % (ref 43–77)
Platelets: 249 10*3/uL (ref 150–400)
RBC: 4.64 MIL/uL (ref 3.87–5.11)
RDW: 15.6 % — ABNORMAL HIGH (ref 11.5–15.5)

## 2013-12-10 LAB — URINALYSIS, ROUTINE W REFLEX MICROSCOPIC
Bilirubin Urine: NEGATIVE
Hgb urine dipstick: NEGATIVE
Nitrite: NEGATIVE
Protein, ur: NEGATIVE mg/dL
Urobilinogen, UA: 0.2 mg/dL (ref 0.0–1.0)

## 2013-12-10 LAB — URINE MICROSCOPIC-ADD ON

## 2013-12-10 MED ORDER — TRAMADOL HCL 50 MG PO TABS
50.0000 mg | ORAL_TABLET | Freq: Four times a day (QID) | ORAL | Status: DC | PRN
  Administered 2013-12-10 – 2013-12-11 (×2): 50 mg via ORAL
  Filled 2013-12-10 (×2): qty 1

## 2013-12-10 MED ORDER — ACETAMINOPHEN 325 MG PO TABS
650.0000 mg | ORAL_TABLET | Freq: Four times a day (QID) | ORAL | Status: DC | PRN
  Administered 2013-12-11: 650 mg via ORAL
  Filled 2013-12-10 (×2): qty 2

## 2013-12-10 MED ORDER — DIPHENHYDRAMINE HCL 50 MG/ML IJ SOLN: 50.0000 mg | Freq: Four times a day (QID) | INTRAMUSCULAR | Status: DC | PRN

## 2013-12-10 MED ORDER — ENOXAPARIN SODIUM 40 MG/0.4ML ~~LOC~~ SOLN
40.0000 mg | SUBCUTANEOUS | Status: DC
  Administered 2013-12-10 – 2013-12-13 (×4): 40 mg via SUBCUTANEOUS
  Filled 2013-12-10 (×4): qty 0.4

## 2013-12-10 MED ORDER — ALBUTEROL SULFATE (5 MG/ML) 0.5% IN NEBU
5.0000 mg | INHALATION_SOLUTION | Freq: Once | RESPIRATORY_TRACT | Status: AC
  Administered 2013-12-10: 5 mg via RESPIRATORY_TRACT
  Filled 2013-12-10: qty 1

## 2013-12-10 MED ORDER — LEVOTHYROXINE SODIUM 25 MCG PO TABS
25.0000 ug | ORAL_TABLET | ORAL | Status: DC
  Administered 2013-12-10 – 2013-12-14 (×5): 25 ug via ORAL
  Filled 2013-12-10 (×5): qty 1

## 2013-12-10 MED ORDER — IPRATROPIUM BROMIDE 0.02 % IN SOLN
0.5000 mg | Freq: Four times a day (QID) | RESPIRATORY_TRACT | Status: DC
  Administered 2013-12-10 – 2013-12-14 (×16): 0.5 mg via RESPIRATORY_TRACT
  Filled 2013-12-10 (×16): qty 2.5

## 2013-12-10 MED ORDER — ONDANSETRON HCL 4 MG/2ML IJ SOLN
4.0000 mg | Freq: Four times a day (QID) | INTRAMUSCULAR | Status: DC | PRN
  Administered 2013-12-10: 4 mg via INTRAVENOUS
  Filled 2013-12-10 (×2): qty 2

## 2013-12-10 MED ORDER — ACETAMINOPHEN 650 MG RE SUPP: 650.0000 mg | Freq: Four times a day (QID) | RECTAL | Status: DC | PRN

## 2013-12-10 MED ORDER — SODIUM CHLORIDE 0.9 % IJ SOLN
3.0000 mL | Freq: Two times a day (BID) | INTRAMUSCULAR | Status: DC
  Administered 2013-12-10 – 2013-12-13 (×5): 3 mL via INTRAVENOUS

## 2013-12-10 MED ORDER — ALBUTEROL SULFATE (5 MG/ML) 0.5% IN NEBU
2.5000 mg | INHALATION_SOLUTION | Freq: Four times a day (QID) | RESPIRATORY_TRACT | Status: DC
  Administered 2013-12-10 – 2013-12-14 (×16): 2.5 mg via RESPIRATORY_TRACT
  Filled 2013-12-10 (×16): qty 0.5

## 2013-12-10 MED ORDER — DIPHENHYDRAMINE HCL 25 MG PO CAPS
50.0000 mg | ORAL_CAPSULE | ORAL | Status: DC | PRN
  Administered 2013-12-11 – 2013-12-12 (×3): 50 mg via ORAL
  Filled 2013-12-10 (×3): qty 2

## 2013-12-10 MED ORDER — ACETAMINOPHEN 325 MG PO TABS
650.0000 mg | ORAL_TABLET | Freq: Once | ORAL | Status: AC
  Administered 2013-12-10: 650 mg via ORAL
  Filled 2013-12-10: qty 2

## 2013-12-10 MED ORDER — ALUM & MAG HYDROXIDE-SIMETH 200-200-20 MG/5ML PO SUSP: 30.0000 mL | Freq: Four times a day (QID) | ORAL | Status: DC | PRN

## 2013-12-10 MED ORDER — LORAZEPAM 0.5 MG PO TABS
0.5000 mg | ORAL_TABLET | Freq: Four times a day (QID) | ORAL | Status: DC | PRN
  Administered 2013-12-10 – 2013-12-13 (×3): 0.5 mg via ORAL
  Filled 2013-12-10 (×3): qty 1

## 2013-12-10 MED ORDER — TRIMETHOPRIM 100 MG PO TABS
100.0000 mg | ORAL_TABLET | Freq: Every day | ORAL | Status: DC
  Administered 2013-12-10 – 2013-12-13 (×4): 100 mg via ORAL
  Filled 2013-12-10 (×5): qty 1

## 2013-12-10 MED ORDER — ONDANSETRON HCL 4 MG/2ML IJ SOLN
4.0000 mg | Freq: Once | INTRAMUSCULAR | Status: AC
  Administered 2013-12-10: 4 mg via INTRAVENOUS
  Filled 2013-12-10: qty 2

## 2013-12-10 MED ORDER — ONDANSETRON HCL 4 MG PO TABS: 4.0000 mg | ORAL_TABLET | Freq: Four times a day (QID) | ORAL | Status: DC | PRN

## 2013-12-10 MED ORDER — INSULIN ASPART 100 UNIT/ML ~~LOC~~ SOLN
0.0000 [IU] | Freq: Three times a day (TID) | SUBCUTANEOUS | Status: DC
  Administered 2013-12-10: 3 [IU] via SUBCUTANEOUS
  Administered 2013-12-10: 1 [IU] via SUBCUTANEOUS
  Administered 2013-12-11 – 2013-12-13 (×8): 2 [IU] via SUBCUTANEOUS
  Administered 2013-12-13: 3 [IU] via SUBCUTANEOUS
  Administered 2013-12-14: 1 [IU] via SUBCUTANEOUS

## 2013-12-10 MED ORDER — SODIUM CHLORIDE 0.9 % IV BOLUS (SEPSIS)
1000.0000 mL | Freq: Once | INTRAVENOUS | Status: AC
  Administered 2013-12-10: 1000 mL via INTRAVENOUS

## 2013-12-10 MED ORDER — INSULIN ASPART 100 UNIT/ML ~~LOC~~ SOLN
0.0000 [IU] | Freq: Every day | SUBCUTANEOUS | Status: DC
  Administered 2013-12-13: 2 [IU] via SUBCUTANEOUS

## 2013-12-10 MED ORDER — HYDROCODONE-ACETAMINOPHEN 5-325 MG PO TABS
2.0000 | ORAL_TABLET | ORAL | Status: DC | PRN
  Administered 2013-12-10 – 2013-12-11 (×4): 2 via ORAL
  Filled 2013-12-10 (×5): qty 2

## 2013-12-10 MED ORDER — INSULIN GLARGINE 100 UNIT/ML ~~LOC~~ SOLN
35.0000 [IU] | Freq: Every day | SUBCUTANEOUS | Status: DC
  Administered 2013-12-10 – 2013-12-11 (×2): 35 [IU] via SUBCUTANEOUS
  Filled 2013-12-10 (×3): qty 0.35

## 2013-12-10 MED ORDER — DEXTROSE 5 % IV SOLN
1.0000 g | Freq: Once | INTRAVENOUS | Status: AC
  Administered 2013-12-10: 1 g via INTRAVENOUS
  Filled 2013-12-10: qty 10

## 2013-12-10 MED ORDER — ATORVASTATIN CALCIUM 10 MG PO TABS
10.0000 mg | ORAL_TABLET | Freq: Every day | ORAL | Status: DC
  Administered 2013-12-10 – 2013-12-13 (×4): 10 mg via ORAL
  Filled 2013-12-10 (×4): qty 1

## 2013-12-10 MED ORDER — DEXTROSE 5 % IV SOLN
1.0000 g | INTRAVENOUS | Status: DC
  Administered 2013-12-11 – 2013-12-14 (×4): 1 g via INTRAVENOUS
  Filled 2013-12-10 (×4): qty 10

## 2013-12-10 MED ORDER — SODIUM CHLORIDE 0.9 % IV SOLN
INTRAVENOUS | Status: DC
  Administered 2013-12-11 – 2013-12-14 (×6): via INTRAVENOUS

## 2013-12-10 MED ORDER — DOCUSATE SODIUM 100 MG PO CAPS
100.0000 mg | ORAL_CAPSULE | Freq: Two times a day (BID) | ORAL | Status: DC
  Administered 2013-12-10 – 2013-12-14 (×9): 100 mg via ORAL
  Filled 2013-12-10 (×9): qty 1

## 2013-12-10 NOTE — Progress Notes (Addendum)
Placed PT on nasal CPAP 10 with  2 LPM bled in to machine. PT has not had sleep study since 2004 as can be determined; by records approximation. No settings are known. PT states she has one at home and only uses only every now and then, then gives up. She states machine was delivered with no instruction and placed which I don't doubt.

## 2013-12-10 NOTE — Progress Notes (Signed)
PT Cancellation Note  Patient Details Name: [name] [name] MRN: [id number] DOB: [birth year]   Cancelled Treatment:    Reason Eval/Treat Not Completed: Fatigue/lethargy limiting ability to participate Pt states that she is simply not feeling well enough to work with me this afternoon.  She has been fully independent PTA so I would anticipate that she will be functional at baseline as she improves medically.  [name] L 12/10/2013, 1:36 PM

## 2013-12-10 NOTE — Progress Notes (Signed)
ANTIBIOTIC CONSULT NOTE - INITIAL  Pharmacy Consult for Rocephin Indication: UTI  Allergies  Allergen Reactions  . Codeine Itching    Patient Measurements: Height: 5' 4.5" (163.8 cm) Weight: 210 lb 1.6 oz (95.3 kg) IBW/kg (Calculated) : 55.85  Vital Signs: Temp: 98 F (36.7 C) (12/19 0921) Temp src: Oral (12/19 0921) BP: 102/63 mmHg (12/19 0921) Pulse Rate: 72 (12/19 0921) Intake/Output from previous day:   Intake/Output from this shift:    Labs:  Recent Labs  12/09/13 0127 12/10/13 0712  WBC 10.2 7.2  HGB 12.1 12.0  PLT 247 249  CREATININE 0.75 0.73   Estimated Creatinine Clearance: 68.8 ml/min (by C-G formula based on Cr of 0.73). No results found for this basename: Rolm Gala, VANCORANDOM, GENTTROUGH, GENTPEAK, GENTRANDOM, TOBRATROUGH, TOBRAPEAK, TOBRARND, AMIKACINPEAK, AMIKACINTROU, AMIKACIN,  in the last 72 hours   Microbiology: Recent Results (from the past 720 hour(s))  URINE CULTURE     Status: None   Collection Time    12/09/13  1:30 AM      Result Value Range Status   Specimen Description URINE, CLEAN CATCH   Final   Special Requests NONE   Final   Culture  Setup Time     Final   Value: 12/09/2013 02:00     Performed at [facility] Count     Final   Value: 5,000 COLONIES/ML     Performed at [facility]   Culture     Final   Value: INSIGNIFICANT GROWTH     Performed at [facility]   Report Status 12/10/2013 FINAL   Final    Medical History: Past Medical History  Diagnosis Date  . Arteriosclerotic cardiovascular disease (ASCVD)      CABG surgery in 2001; nl stress nuclear-2007; angiography in 01/2006- TO of LAD; 70% ostial stenosis      of a small ramus; patent grafts; normal EF; cath in 12/2009 essentially unchanged; EF-66%;  Congestive heart failure with preserved LV systolic function  . Hypertension   . Obstructive sleep apnea   . Cerebrovascular disease     H/o CVA  . Chronic back pain   .  Vestibular neuritis     ataxia  . Varicose veins   . Diabetes mellitus     Requires insulin  . Obesity   . Chronic obstructive pulmonary disease     Chronic bronchitis  . Fall 2009    right humeral fracture  . Peripheral neuropathy     Left lower extremity; possibly radiculopathy  . Back pain, chronic   . Hyperthyroidism   . Acute renal failure     Possibly contrast-induced    Medications:  Scheduled:  . albuterol  2.5 mg Nebulization Q6H  . atorvastatin  10 mg Oral q1800  . [START ON 12/11/2013] cefTRIAXone (ROCEPHIN)  IV  1 g Intravenous Q24H  . docusate sodium  100 mg Oral BID  . enoxaparin (LOVENOX) injection  40 mg Subcutaneous Q24H  . insulin aspart  0-5 Units Subcutaneous QHS  . insulin aspart  0-9 Units Subcutaneous TID WC  . insulin glargine  35 Units Subcutaneous QHS  . ipratropium  0.5 mg Nebulization Q6H  . levothyroxine  25 mcg Oral BH-q7a  . sodium chloride  3 mL Intravenous Q12H  . trimethoprim  100 mg Oral QHS   Assessment: 77 yo F with UTI that has not resolved with oral antibiotics.   Rocephin does not require renal dose adjustments.  Rocephin 12/18>>  Goal of Therapy:  Eradicate infection.  Plan:  Rocephin 1gm IV q24h Duration of therapy per MD Pharmacy to sign off.  Please re-consult as needed  [name] [name] 12/10/2013,11:17 AM

## 2013-12-10 NOTE — H&P (Signed)
Patient seen and examined.  Agree with note as per [name], NP  Patient has been admitted with ongoing fevers and UTI.  She had nausea and vomiting.  Failed outpatient treatment of UTI.  Blood pressure on low side.  Started on IV fluids and antibiotics. She is short of breath and describes a history of COPD.  She has a cough.  On respiratory exam, she has scattered wheezes and diminished breath sounds bilaterally. She will be continued on neb treatments and antibiotics. Will repeat chest xray in am to evaluate for developing pneumonia.   Patient will be admitted to the service of Dr. Renard Matter. [facility] hospitalists will be available for any issues until 7AM on 12/20.  [name] [name]

## 2013-12-10 NOTE — Progress Notes (Signed)
Patient bladder scanned with a post void residual of 165cc.

## 2013-12-10 NOTE — ED Provider Notes (Signed)
CSN: [id number]     Arrival date & time 12/10/13  4098 History  This chart was scribed for [name] Gaskins, MD by Bennett Scrape, [name] This patient was seen in room APA11/APA11 and the patient's care was started at 7:07 AM.   Chief Complaint  Patient presents with  . Nausea  . Fatigue  . Fever  . Urinary Tract Infection    Patient is a 77 y.o. female presenting with general illness. The history is provided by the patient. No language interpreter was used.  Illness Location:  Generalized Quality:  Fatigue Severity:  Moderate Duration: started early yesterday. Progression:  Worsening Chronicity:  New Associated symptoms: fatigue, fever, headaches and nausea   Associated symptoms: no abdominal pain, no chest pain, no shortness of breath and no vomiting     HPI Comments: [name] [name] is a 77 y.o. female brought in by ambulance, who presents to the Emergency Department complaining of fatigue saying "my legs won't work" that she believes started yesterday. She reports a possible associated fever, mild HA, nausea and dysuria since the onset as well. She states that she has been unable to stand or ambulate since the onset causing her further concern. She was seen in the ED yesterday for the same and was diagnosed with an UTI. She was given IV antibiotics and was discharged home with a prescription for Keflex. She states that she has since taken a few doses of the antibiotics with no improvement. She denies any CP, emesis, SOB or abdominal pain.    Past Medical History  Diagnosis Date  . Arteriosclerotic cardiovascular disease (ASCVD)      CABG surgery in 2001; nl stress nuclear-2007; angiography in 01/2006- TO of LAD; 70% ostial stenosis      of a small ramus; patent grafts; normal EF; cath in 12/2009 essentially unchanged; EF-66%;  Congestive heart failure with preserved LV systolic function  . Hypertension   . Obstructive sleep apnea   . Cerebrovascular disease     H/o CVA  .  Chronic back pain   . Vestibular neuritis     ataxia  . Varicose veins   . Diabetes mellitus     Requires insulin  . Obesity   . Chronic obstructive pulmonary disease     Chronic bronchitis  . Fall 2009    right humeral fracture  . Peripheral neuropathy     Left lower extremity; possibly radiculopathy  . Back pain, chronic   . Hyperthyroidism   . Acute renal failure     Possibly contrast-induced   Past Surgical History  Procedure Laterality Date  . Coronary artery bypass graft  6/01    SVG to diagonal, LIMA to LAD  . Rotator cuff repair    . Appendectomy    . Cholecystectomy    . Total abdominal hysterectomy    . Lumbar spine surgery      X2  . Knee surgery      Arthroscopic  . Carpal tunnel release      Bilateral  . Orif humerus fracture  2009   Family History  Problem Relation Age of Onset  . Coronary artery disease      Multiple first and second-degree relatives  . Aneurysm      Cerebral circulation  . Hypertension Mother   . Colon cancer Sister   . Clotting disorder      Children diagnosed with hypercoagulable state   History  Substance Use Topics  . Smoking status: Never Smoker   .  Smokeless tobacco: Never Used  . Alcohol Use: No   No OB history provided.  Review of Systems  Constitutional: Positive for fever and fatigue.  Respiratory: Negative for shortness of breath.   Cardiovascular: Negative for chest pain.  Gastrointestinal: Positive for nausea. Negative for vomiting and abdominal pain.  Genitourinary: Positive for dysuria.  Neurological: Positive for headaches.  All other systems reviewed and are negative.    Allergies  Codeine  Home Medications   Current Outpatient Rx  Name  Route  Sig  Dispense  Refill  . cefUROXime (CEFTIN) 250 MG tablet   Oral   Take 1 tablet (250 mg total) by mouth 2 (two) times daily.   20 tablet   0   . cephALEXin (KEFLEX) 500 MG capsule   Oral   Take 1 capsule (500 mg total) by mouth 4 (four) times  daily.   28 capsule   0   . ciprofloxacin (CIPRO) 500 MG tablet   Oral   Take 500 mg by mouth 2 (two) times daily.         [name] HYDROcodone-acetaminophen (NORCO/VICODIN) 5-325 MG per tablet   Oral   Take 2 tablets by mouth every 4 (four) hours as needed for pain.   10 tablet   0   . insulin glargine (LANTUS SOLOSTAR) 100 UNIT/ML injection   Subcutaneous   Inject 60 Units into the skin at bedtime.           . insulin glulisine (APIDRA) 100 UNIT/ML injection   Subcutaneous   Inject 20 Units into the skin 3 (three) times daily before meals.          [name] levothyroxine (SYNTHROID, LEVOTHROID) 25 MCG tablet   Oral   Take 25 mcg by mouth every morning.          [name] lisinopril-hydrochlorothiazide (PRINZIDE,ZESTORETIC) 20-12.5 MG per tablet   Oral   Take 1 tablet by mouth daily.         [name] LORazepam (ATIVAN) 0.5 MG tablet   Oral   Take 0.5 mg by mouth every 4 (four) hours as needed. nerves         . metFORMIN (GLUCOPHAGE) 500 MG tablet   Oral   Take 500 mg by mouth 2 (two) times daily with a meal.           . nitroGLYCERIN (NITROSTAT) 0.4 MG SL tablet   Sublingual   Place 0.4 mg under the tongue every 5 (five) minutes as needed for chest pain.          [name] ondansetron (ZOFRAN ODT) 4 MG disintegrating tablet      4mg  ODT q6 hours prn nausea/vomit   12 tablet   0   . phenazopyridine (PYRIDIUM) 100 MG tablet   Oral   Take 100 mg by mouth 3 (three) times daily as needed for pain.         . rosuvastatin (CRESTOR) 10 MG tablet   Oral   Take 10 mg by mouth daily.         . traMADol (ULTRAM) 50 MG tablet   Oral   Take 50 mg by mouth every 6 (six) hours as needed for pain.          [name] trimethoprim (TRIMPEX) 100 MG tablet   Oral   Take 100 mg by mouth at bedtime.          Triage Vitals: BP 107/61  Pulse 91  Temp(Src) 101.3 F (38.5 C) (Oral)  Resp  20  Ht 5\' 4"  (1.626 m)  Wt 210 lb (95.255 kg)  BMI 36.03 kg/m2  SpO2 92%  Physical Exam  Nursing note  and vitals reviewed.  CONSTITUTIONAL: Well developed/well nourished HEAD: Normocephalic/atraumatic EYES: EOMI/PERRL ENMT: Mucous membranes dry NECK: supple no meningeal signs SPINE:entire spine nontender CV: S1/S2 noted, no murmurs/rubs/gallops noted LUNGS: scattered wheezing noted bilaterally ABDOMEN: soft, nontender, no rebound or guarding NEURO: Pt is awake/alert, moves all extremitiesx4 EXTREMITIES: pulses normal, full ROM SKIN: warm, color normal PSYCH: no abnormalities of mood noted   ED Course  Procedures (including critical care time)  Medications  cefTRIAXone (ROCEPHIN) 1 g in dextrose 5 % 50 mL IVPB (1 g Intravenous New Bag/Given 12/10/13 0727)  sodium chloride 0.9 % bolus 1,000 mL (1,000 mLs Intravenous New Bag/Given 12/10/13 0727)  ondansetron (ZOFRAN) injection 4 mg (4 mg Intravenous Given 12/10/13 0713)  acetaminophen (TYLENOL) tablet 650 mg (650 mg Oral Given 12/10/13 0727)    DIAGNOSTIC STUDIES: Oxygen Saturation is 92% on RA, adequate by my interpretation.    COORDINATION OF CARE: 7:12 AM-Discussed treatment plan which includes IV antibiotics and admission for further UTI treatment with pt at bedside and pt agreed to plan.   8:30 AM Pt febrile, not responding to home antibiotics for UTI, will admit She did have some wheeze on lung exam with mild hypoxia, nebs ordered and CXR ordered Also, BP is trending from 90-100, but lactate normal, needs further resuscitation D/w dr Kerry Hough, will admit   Labs Review Labs Reviewed - No data to display Imaging Review Dg Chest 2 View  12/09/2013   CLINICAL DATA:  Nausea and emesis  EXAM: CHEST  2 VIEW  COMPARISON:  07/19/2012  FINDINGS: No cardiomegaly. Coronary artery atherosclerosis, status post CABG. No acute infiltrate or edema. No effusion or pneumothorax. Chronic left upper lobe scar. The status post ORIF of the proximal right humerus.  IMPRESSION: No active cardiopulmonary disease.   Electronically Signed   By:  Tiburcio Pea M.D.   On: 12/09/2013 02:07    EKG Interpretation   None       MDM  No diagnosis found. Nursing notes including past medical history and social history reviewed and considered in documentation Labs/vital reviewed and considered Previous records reviewed and considered   I personally performed the services described in this documentation, which was scribed in my presence. The recorded information has been reviewed and is accurate.      [name] Gaskins, MD 12/10/13 [phone]

## 2013-12-10 NOTE — H&P (Signed)
[facility] History and Physical  [name] OZH:[id number] DOB: [birth year] DOA: 12/10/2013  Referring physician:  PCP: Alice Reichert, MD   Chief Complaint: Generalized weakness and fever   HPI: and  [name] [name] is a pleasant 77 y.o. female with past medical history that includes COPD, diabetes, hypertension, TIA, CAD, obesity presents to the emergency room with the chief complaint of fever, fatigue and generalized weakness. Information is obtained from the patient. She indicates that about 10 days ago she was experiencing dysuria and went to her primary care provider was diagnosed with a UTI and placed on antibiotics. She states she did not improve and developed nausea and vomiting and came to the emergency room yesterday. She was given IV antibiotics and discharged home with a prescription for Keflex. This morning she developed generalized weakness and reports that she was unable to get out of bed without assistance. He denies any chest pain palpitation shortness of breath headache dizziness syncope or near-syncope. She does report she continues to experience dysuria but denies hematuria. She also believes she has frequency and does not feel like she empties her bladder with each urination. She continues with nausea but has had no emesis. There has been decreased by mouth intake and she reports her blood sugar has been running "on the high side". In the emergency room she was found to be febrile with a temp of 101.3 slightly soft blood pressure with systolic at 102 heart rate 91. She also had one episode of hypoxia where her sats dropped to 86% on room air. He was given nebulizers oxygen and her sats increased to 95%. Chest x-ray without any active abnormality. In the ED she was given one dose of Rocephin and 1 L normal saline. Lab work significant for serum glucose 153, lactic acid was 0.95. Rest admit for further evaluation and treatment.  Review of Systems:  10 point review of  systems completed and all systems are negative except as indicated in the history of present illness.  Past Medical History  Diagnosis Date  . Arteriosclerotic cardiovascular disease (ASCVD)      CABG surgery in 2001; nl stress nuclear-2007; angiography in 01/2006- TO of LAD; 70% ostial stenosis      of a small ramus; patent grafts; normal EF; cath in 12/2009 essentially unchanged; EF-66%;  Congestive heart failure with preserved LV systolic function  . Hypertension   . Obstructive sleep apnea   . Cerebrovascular disease     H/o CVA  . Chronic back pain   . Vestibular neuritis     ataxia  . Varicose veins   . Diabetes mellitus     Requires insulin  . Obesity   . Chronic obstructive pulmonary disease     Chronic bronchitis  . Fall 2009    right humeral fracture  . Peripheral neuropathy     Left lower extremity; possibly radiculopathy  . Back pain, chronic   . Hyperthyroidism   . Acute renal failure     Possibly contrast-induced   Past Surgical History  Procedure Laterality Date  . Coronary artery bypass graft  6/01    SVG to diagonal, LIMA to LAD  . Rotator cuff repair    . Appendectomy    . Cholecystectomy    . Total abdominal hysterectomy    . Lumbar spine surgery      X2  . Knee surgery      Arthroscopic  . Carpal tunnel release      Bilateral  .  Orif humerus fracture  2009   Social History:  reports that she has never smoked. She has never used smokeless tobacco. She reports that she does not drink alcohol or use illicit drugs. She is married lives with her husband. She is a retired Scientist, product/process development. She is independent with ADLs Allergies  Allergen Reactions  . Codeine Itching    Family History  Problem Relation Age of Onset  . Coronary artery disease      Multiple first and second-degree relatives  . Aneurysm      Cerebral circulation  . Hypertension Mother   . Colon cancer Sister   . Clotting disorder      Children diagnosed with hypercoagulable state      Prior to Admission medications   Medication Sig Start Date End Date Taking? Authorizing Provider  acetaminophen (TYLENOL) 500 MG tablet Take 1,000 mg by mouth every 6 (six) hours as needed for fever.   Yes Historical Provider, MD  cephALEXin (KEFLEX) 500 MG capsule Take 1 capsule (500 mg total) by mouth 4 (four) times daily. 12/09/13  Yes Benny Lennert, MD  HYDROcodone-acetaminophen (NORCO/VICODIN) 5-325 MG per tablet Take 2 tablets by mouth every 4 (four) hours as needed for pain. 03/11/13  Yes Gilda Crease, MD  insulin glargine (LANTUS SOLOSTAR) 100 UNIT/ML injection Inject 70 Units into the skin at bedtime.    Yes Historical Provider, MD  insulin lispro (HUMALOG) 100 UNIT/ML injection Inject 20 Units into the skin 3 (three) times daily.   Yes Historical Provider, MD  levothyroxine (SYNTHROID, LEVOTHROID) 25 MCG tablet Take 25 mcg by mouth every morning.    Yes Historical Provider, MD  lisinopril (PRINIVIL,ZESTRIL) 10 MG tablet Take 1 tablet by mouth daily. 11/09/13  Yes Historical Provider, MD  lisinopril-hydrochlorothiazide (PRINZIDE,ZESTORETIC) 20-12.5 MG per tablet Take 1 tablet by mouth daily.   Yes Historical Provider, MD  LORazepam (ATIVAN) 0.5 MG tablet Take 0.5 mg by mouth every 4 (four) hours as needed. nerves   Yes Historical Provider, MD  metFORMIN (GLUCOPHAGE) 500 MG tablet Take 500 mg by mouth 2 (two) times daily with a meal.     Yes Historical Provider, MD  nitroGLYCERIN (NITROSTAT) 0.4 MG SL tablet Place 0.4 mg under the tongue every 5 (five) minutes as needed for chest pain.    Yes Historical Provider, MD  rosuvastatin (CRESTOR) 10 MG tablet Take 10 mg by mouth daily.   Yes Historical Provider, MD  traMADol (ULTRAM) 50 MG tablet Take 50 mg by mouth every 6 (six) hours as needed for pain.    Yes Historical Provider, MD  trimethoprim (TRIMPEX) 100 MG tablet Take 100 mg by mouth at bedtime.   Yes Historical Provider, MD  trospium (SANCTURA) 20 MG tablet Take 1 tablet by  mouth daily. 09/10/13  Yes Historical Provider, MD   Physical Exam: Filed Vitals:   12/10/13 0921  BP: 102/63  Pulse: 72  Temp: 98 F (36.7 C)  Resp:     BP 102/63  Pulse 72  Temp(Src) 98 F (36.7 C) (Oral)  Resp 22  Ht 5' 4.5" (1.638 m)  Wt 95.3 kg (210 lb 1.6 oz)  BMI 35.52 kg/m2  SpO2 94%  General:  Appears calm and comfortable, somewhat ill appearing Eyes: PERRL, normal lids, irises & conjunctiva ENT: grossly normal hearing, nose without drainage. Oropharynx without erythema or exudate. Mucous membranes of her mouth are pink but dry Neck: no LAD, masses or thyromegaly, range of motion Cardiovascular: RRR, no m/r/g. No LE  edema.  Respiratory: CTA bilaterally but somewhat distant. no w/r/r. Normal respiratory effort. Some flank tenderness to palpate Abdomen: Obese soft positive bowel sounds throughout mild tenderness mid lower Corder and. No mass organomegaly no Skin: History of eczema. Moderate pink rash on the right shin. No drainage Musculoskeletal: grossly normal tone BUE/BLE.  Psychiatric: grossly normal mood and affect, speech fluent and appropriate Neurologic: grossly non-focal.cranial nerves II through XII grossly intact speech clear facial symmetry           Labs on Admission:  Basic Metabolic Panel:  Recent Labs Lab 12/09/13 0127 12/10/13 0712  NA 138 136  K 3.3* 4.0  CL 101 99  CO2 27 29  GLUCOSE 86 153*  BUN 9 10  CREATININE 0.75 0.73  CALCIUM 8.9 8.8   Liver Function Tests:  Recent Labs Lab 12/09/13 0127  AST 17  ALT 15  ALKPHOS 72  BILITOT 0.3  PROT 6.7  ALBUMIN 3.2*   No results found for this basename: LIPASE, AMYLASE,  in the last 168 hours No results found for this basename: AMMONIA,  in the last 168 hours CBC:  Recent Labs Lab 12/09/13 0127 12/10/13 0712  WBC 10.2 7.2  NEUTROABS 7.0 4.5  HGB 12.1 12.0  HCT 38.5 38.5  MCV 82.4 83.0  PLT 247 249   Cardiac Enzymes: No results found for this basename: CKTOTAL, CKMB,  CKMBINDEX, TROPONINI,  in the last 168 hours  BNP (last 3 results) No results found for this basename: PROBNP,  in the last 8760 hours CBG:  Recent Labs Lab 12/10/13 0716  GLUCAP 146*    Radiological Exams on Admission: Dg Chest 2 View  12/09/2013   CLINICAL DATA:  Nausea and emesis  EXAM: CHEST  2 VIEW  COMPARISON:  07/19/2012  FINDINGS: No cardiomegaly. Coronary artery atherosclerosis, status post CABG. No acute infiltrate or edema. No effusion or pneumothorax. Chronic left upper lobe scar. The status post ORIF of the proximal right humerus.  IMPRESSION: No active cardiopulmonary disease.   Electronically Signed   By: Tiburcio Pea M.D.   On: 12/09/2013 02:07   Dg Chest Portable 1 View  12/10/2013   CLINICAL DATA:  Nausea, fatigue, UTI  EXAM: PORTABLE CHEST - 1 VIEW  COMPARISON:  12/09/2013  FINDINGS: Mild bilateral interstitial prominence. Left upper lobe fibrosis again noted. There is no focal parenchymal opacity, pleural effusion, or pneumothorax. The heart and mediastinal contours are unremarkable. Prior CABG.  Prior ORIF of the right proximal humerus and prior right rotator cuff repair.  IMPRESSION: No active disease.   Electronically Signed   By: Elige Ko   On: 12/10/2013 08:29    EKG: Pending  Assessment/Plan Principal Problem:   Hypoxia: A shunt with history of COPD and sleep apnea and noncompliance. Chest x-ray unremarkable exam breath sounds slightly diminished.Concern she may be developing pna that is not evident on xray due to dehydration. Will gently hydrate and consider repeat chest xray in am if no improvement. Continue nebs and request respiratory therapy to consult for CPAP which she is supposed to wear but does not wear at home. Continue oxygen and monitor sats  Active Problems:  UTI (lower urinary tract infection): Failed outpatient therapy. Some concern for urinary retention. We'll provide Rocephin to be dosed by pharmacy. Will request post void ultrasound.  Will also get renal ultrasound as well. Her vital signs every 4x3. Currently she is hemodynamically stable and nontoxic appearing    Weakness generalized: Likely related to #1 and #2.  Will request PT for evaluation. Expect her to return to baseline quickly upon resolution of #1 and #2   Chronic obstructive pulmonary disease: See #1. Continue nebs.   HYPERTENSION: Somewhat soft in the emergency department. Medications include lisinopril, hydrochlorothiazide and we will hold these for now. Will monitor closely and resume as indicated. Continue IV fluids.   DIABETES MELLITUS, TYPE II: Will check hemoglobin A1c. Patient is on 70 units of Lantus at home will provide half that dose. Will hold the metformin for now. Will use sliding scale. Heart modified diet    Hypothyroidism: Continue Synthroid     OBESITY: BMI 36.1. Nutritional consult     Arteriosclerotic cardiovascular disease (ASCVD): No chest pain. No arrhythmias noted on telemetry. Will get an EKG due to #1. Continue Crestor.    SLEEP APNEA: Patient reports noncompliance with CPAP at home. Will request respiratory consult    Chronic back pain: Appears stable at baseline     Psoriasis: Appears stable at baseline   TIA (transient ischemic attack): In July of 2013. Stable. No deficit     Code Status: Full Family Communication: none present Disposition Plan: home when ready likely 48 hours  Time spent: 65 minutes  [facility] Pager [phone]

## 2013-12-10 NOTE — ED Notes (Signed)
Pt placed on 2L O2 [place], sats improved to 92%, pt states she is not on home O2

## 2013-12-10 NOTE — ED Notes (Signed)
Patient brought in via EMS. Alert and oriented. Airway patent. Per EMS patient was treated here yesterday for UTI with IV antibiotics and given a prescription for antibiotics. Patient now c/o nausea, fatigue, and fever. Patient last took tylenol yesterday at 8am. Patient's blood glucose 165 per EMS.

## 2013-12-11 ENCOUNTER — Inpatient Hospital Stay (HOSPITAL_COMMUNITY): Payer: Medicare Other

## 2013-12-11 LAB — CBC
HCT: 37.5 % (ref 36.0–46.0)
Hemoglobin: 11.4 g/dL — ABNORMAL LOW (ref 12.0–15.0)
MCH: 25.4 pg — ABNORMAL LOW (ref 26.0–34.0)
MCHC: 30.4 g/dL (ref 30.0–36.0)
RBC: 4.48 MIL/uL (ref 3.87–5.11)
WBC: 7.2 10*3/uL (ref 4.0–10.5)

## 2013-12-11 LAB — BASIC METABOLIC PANEL
Chloride: 101 mEq/L (ref 96–112)
GFR calc Af Amer: >90 mL/min (ref >90)
GFR calc non Af Amer: 82 mL/min — ABNORMAL LOW (ref >90)
Potassium: 3.9 mEq/L (ref 3.5–5.1)
Sodium: 136 mEq/L (ref 135–145)

## 2013-12-11 MED ORDER — FUROSEMIDE 20 MG PO TABS
20.0000 mg | ORAL_TABLET | Freq: Once | ORAL | Status: AC
  Administered 2013-12-11: 20 mg via ORAL
  Filled 2013-12-11: qty 1

## 2013-12-11 NOTE — Progress Notes (Signed)
Utilization review Completed Jaylon Boylen RN BSN   

## 2013-12-11 NOTE — Progress Notes (Signed)
PT wore CPAP for about an hour, took off SAT's decreased to 80 on room air while asleep, placed back on 2lpm/[place]

## 2013-12-12 ENCOUNTER — Inpatient Hospital Stay (HOSPITAL_COMMUNITY): Payer: Medicare Other

## 2013-12-12 DIAGNOSIS — I517 Cardiomegaly: Secondary | ICD-10-CM

## 2013-12-12 HISTORY — PX: TRANSTHORACIC ECHOCARDIOGRAM: SHX275

## 2013-12-12 LAB — BASIC METABOLIC PANEL
BUN: 7 mg/dL (ref 6–23)
CO2: 27 mEq/L (ref 19–32)
Chloride: 98 mEq/L (ref 96–112)
Creatinine, Ser: 0.73 mg/dL (ref 0.50–1.10)
GFR calc Af Amer: >90 mL/min (ref >90)
Potassium: 3.8 mEq/L (ref 3.5–5.1)

## 2013-12-12 LAB — URINE CULTURE

## 2013-12-12 LAB — GLUCOSE, CAPILLARY
Glucose-Capillary: 169 mg/dL — ABNORMAL HIGH (ref 70–99)
Glucose-Capillary: 170 mg/dL — ABNORMAL HIGH (ref 70–99)
Glucose-Capillary: 158 mg/dL — ABNORMAL HIGH (ref 70–99)
Glucose-Capillary: 174 mg/dL — ABNORMAL HIGH (ref 70–99)

## 2013-12-12 LAB — PRO B NATRIURETIC PEPTIDE: Pro B Natriuretic peptide (BNP): 1377 pg/mL — ABNORMAL HIGH (ref 0–450)

## 2013-12-12 MED ORDER — INSULIN GLARGINE 100 UNIT/ML ~~LOC~~ SOLN
40.0000 [IU] | Freq: Every day | SUBCUTANEOUS | Status: DC
  Administered 2013-12-12 – 2013-12-13 (×2): 40 [IU] via SUBCUTANEOUS
  Filled 2013-12-12 (×2): qty 0.4

## 2013-12-12 MED ORDER — FUROSEMIDE 10 MG/ML IJ SOLN
40.0000 mg | Freq: Once | INTRAMUSCULAR | Status: AC
  Administered 2013-12-12: 40 mg via INTRAVENOUS
  Filled 2013-12-12: qty 4

## 2013-12-12 MED ORDER — PHENAZOPYRIDINE HCL 100 MG PO TABS
100.0000 mg | ORAL_TABLET | Freq: Three times a day (TID) | ORAL | Status: DC
  Administered 2013-12-12 – 2013-12-14 (×7): 100 mg via ORAL
  Filled 2013-12-12 (×7): qty 1

## 2013-12-12 MED ORDER — FLUCONAZOLE 100 MG PO TABS
100.0000 mg | ORAL_TABLET | Freq: Every day | ORAL | Status: DC
  Administered 2013-12-12 – 2013-12-14 (×3): 100 mg via ORAL
  Filled 2013-12-12 (×3): qty 1

## 2013-12-12 NOTE — Progress Notes (Signed)
  Echocardiogram 2D Echocardiogram has been performed.  [name] [name] M 12/12/2013, 5:28 PM

## 2013-12-12 NOTE — Progress Notes (Signed)
Pt does not use CPAP, will only wear for short while and remove.

## 2013-12-12 NOTE — Progress Notes (Signed)
NAME:  [name] [name], [name] [name]              ACCOUNT NO.:  [id number]  MEDICAL RECORD NO.:  [id number]  LOCATION:  A309                          FACILITY:  [facility]  PHYSICIAN:  Venida Tsukamoto G. Renard Matter, MD   DATE OF BIRTH:  1936/04/19  DATE OF PROCEDURE: DATE OF DISCHARGE:                                PROGRESS NOTE   HISTORY OF PRESENT ILLNESS:  This patient has COPD and had hypoxia.  She also had urinary tract infection, generalized weakness, diabetes mellitus type 2, sleep apnea, psoriasis.  Was admitted with these problems.  OBJECTIVE:  GENERAL:  The patient was somewhat drowsy this morning. VITAL SIGNS:  Blood pressure 148/69, respirations 20, pulse 85, temp 100.8. HEENT:  Eyes PERRLA.  TM negative.  Oropharynx benign. NECK:  Supple. NECK:  No JVD or thyroid abnormalities. HEART:  Regular rhythm.  No murmurs. LUNGS:  Clear to P and A. ABDOMEN:  Obese.  No palpable organs or masses. EXTREMITIES:  Free of edema. SKIN:  Shows evidence of psoriasis in various areas.  ASSESSMENT: 1. The patient has a history of chronic obstructive pulmonary disease,     possible early pneumonia.  Plan to continue current antibiotics and     neb treatments. 2. Urinary tract infection.  Plan to continue IV Rocephin.  Ultrasound     of kidneys ordered.  We will continue to monitor blood sugars.  The patient will remain on Lantus insulin 35 units daily and NovoLog by sliding scale.     Brookelyn Gaynor G. Renard Matter, MD     AGM/MEDQ  D:  12/11/2013  T:  12/12/2013  Job:  [id number]

## 2013-12-12 NOTE — Consult Note (Signed)
Consult requested by: Dr. Renard Matter Consult requested for hypoxia:  HPI: This is a 77 year old who came to the hospital because of fever and urinary tract infection. She has been on antibiotics as an outpatient to. Her urine showed that she had what looks like yeast. She has been having trouble with shortness of breath cough and congestion. Chest x-ray done yesterday was suggestive of CHF and her BNP was elevated. She also has a history of sleep apnea. She says she is intolerant of BiPAP  Past Medical History  Diagnosis Date  . Arteriosclerotic cardiovascular disease (ASCVD)      CABG surgery in 2001; nl stress nuclear-2007; angiography in 01/2006- TO of LAD; 70% ostial stenosis      of a small ramus; patent grafts; normal EF; cath in 12/2009 essentially unchanged; EF-66%;  Congestive heart failure with preserved LV systolic function  . Hypertension   . Obstructive sleep apnea   . Cerebrovascular disease     H/o CVA  . Chronic back pain   . Vestibular neuritis     ataxia  . Varicose veins   . Diabetes mellitus     Requires insulin  . Obesity   . Chronic obstructive pulmonary disease     Chronic bronchitis  . Fall 2009    right humeral fracture  . Peripheral neuropathy     Left lower extremity; possibly radiculopathy  . Back pain, chronic   . Hyperthyroidism   . Acute renal failure     Possibly contrast-induced     Family History  Problem Relation Age of Onset  . Coronary artery disease      Multiple first and second-degree relatives  . Aneurysm      Cerebral circulation  . Hypertension Mother   . Colon cancer Sister   . Clotting disorder      Children diagnosed with hypercoagulable state     History   Social History  . Marital Status: Married    Spouse Name: N/A    Number of Children: 2  . Years of Education: N/A   Occupational History  .     Social History Main Topics  . Smoking status: Never Smoker   . Smokeless tobacco: Never Used  . Alcohol Use: No  . Drug  Use: No  . Sexual Activity: No   Other Topics Concern  . None   Social History Narrative  . None     ROS: She says she's had a little bit of cough. She does not hear any wheezes. No other new complaints. Her major complaint is that she has significant burning when she urinates    Objective: Vital signs in last 24 hours: Temp:  [98.1 F (36.7 C)-102.6 F (39.2 C)] 102.6 F (39.2 C) (12/21 0602) Pulse Rate:  [64-75] 75 (12/21 0602) Resp:  [20-22] 20 (12/21 0602) BP: (114-140)/(61-72) 114/61 mmHg (12/21 0602) SpO2:  [92 %-98 %] 92 % (12/21 0746) Weight change:  Last BM Date: 12/08/13  Intake/Output from previous day: 12/20 0701 - 12/21 0700 In: 2572.5 [P.O.:240; I.V.:2232.5; IV Piggyback:100] Out: 1250 [Urine:1250]  PHYSICAL EXAM She is awake and alert now. When I came into the room she was asleep and snoring. She is obese. Her neck is supple. She has oxygen. Her nose and throat are clear. Her chest shows diminished breath sounds bilaterally. Her heart is regular. Her abdomen is soft obese without masses. She does not have any pretibial edema. Central nervous system examination is grossly intact  Lab Results: Basic Metabolic  Panel:  Recent Labs  12/11/13 0613 12/12/13 0604  NA 136 135  K 3.9 3.8  CL 101 98  CO2 27 27  GLUCOSE 152* 155*  BUN 8 7  CREATININE 0.73 0.73  CALCIUM 8.2* 8.4   Liver Function Tests: No results found for this basename: AST, ALT, ALKPHOS, BILITOT, PROT, ALBUMIN,  in the last 72 hours No results found for this basename: LIPASE, AMYLASE,  in the last 72 hours No results found for this basename: AMMONIA,  in the last 72 hours CBC:  Recent Labs  12/10/13 0712 12/11/13 0613  WBC 7.2 7.2  NEUTROABS 4.5  --   HGB 12.0 11.4*  HCT 38.5 37.5  MCV 83.0 83.7  PLT 249 221   Cardiac Enzymes: No results found for this basename: CKTOTAL, CKMB, CKMBINDEX, TROPONINI,  in the last 72 hours BNP:  Recent Labs  12/12/13 0604  PROBNP 1377.0*    D-Dimer: No results found for this basename: DDIMER,  in the last 72 hours CBG:  Recent Labs  12/10/13 1756 12/10/13 1811 12/10/13 2114 12/11/13 0728 12/11/13 1126 12/12/13 0738  GLUCAP 211* 194* 185* 151* 194* 169*   Hemoglobin A1C:  Recent Labs  12/10/13 1050  HGBA1C 8.8*   Fasting Lipid Panel: No results found for this basename: CHOL, HDL, LDLCALC, TRIG, CHOLHDL, LDLDIRECT,  in the last 72 hours Thyroid Function Tests:  Recent Labs  12/10/13 1050  TSH 0.587   Anemia Panel: No results found for this basename: VITAMINB12, FOLATE, FERRITIN, TIBC, IRON, RETICCTPCT,  in the last 72 hours Coagulation: No results found for this basename: LABPROT, INR,  in the last 72 hours Urine Drug Screen: Drugs of Abuse  No results found for this basename: labopia, cocainscrnur, labbenz, amphetmu, thcu, labbarb    Alcohol Level: No results found for this basename: ETH,  in the last 72 hours Urinalysis:  Recent Labs  12/10/13 1430  COLORURINE YELLOW  LABSPEC >1.030*  PHURINE 6.0  GLUCOSEU 250*  HGBUR NEGATIVE  BILIRUBINUR NEGATIVE  KETONESUR TRACE*  PROTEINUR NEGATIVE  UROBILINOGEN 0.2  NITRITE NEGATIVE  LEUKOCYTESUR SMALL*   Misc. Labs:   ABGS: No results found for this basename: PHART, PCO2, PO2ART, TCO2, HCO3,  in the last 72 hours   MICROBIOLOGY: Recent Results (from the past 240 hour(s))  URINE CULTURE     Status: None   Collection Time    12/09/13  1:30 AM      Result Value Range Status   Specimen Description URINE, CLEAN CATCH   Final   Special Requests NONE   Final   Culture  Setup Time     Final   Value: 12/09/2013 02:00     Performed at [facility] Count     Final   Value: 5,000 COLONIES/ML     Performed at [facility]   Culture     Final   Value: INSIGNIFICANT GROWTH     Performed at [facility]   Report Status 12/10/2013 FINAL   Final    Studies/Results: US Renal  12/10/2013   CLINICAL DATA:  UTI.   Flank pain.  EXAM: RENAL/URINARY TRACT ULTRASOUND COMPLETE  COMPARISON:  Ultrasound abdomen 05/28/2010.  FINDINGS: Right Kidney:  Length: 8.4 cm, this is diminished from prior scan of 05/28/2010 at which time the right kidney measured 11.2 cm in length. Cortical thinning is present. No hydronephrosis.  Left Kidney:  Length: Lab 0.5 cm. Echogenicity within normal limits. No mass or hydronephrosis visualized.  Bladder:  Appears normal for degree of bladder distention.  Incidental note is made of slight Liver hyper echogenicity suggesting fatty infiltration.  IMPRESSION: 1. Decreased size right kidney with cortical thinning. A process such is atherosclerotic vascular disease could present in this fashion . The kidney is diminished in size from 05/28/2010 from 11.2 cm to 8.4 cm in length. 2. No hydronephrosis. 3. Fatty infiltration of the liver incidentally noted.   Electronically Signed   By: Maisie Fus  Register   On: 12/10/2013 13:26   Dg Chest Port 1 View  12/11/2013   CLINICAL DATA:  Short of breath.  Confused.  EXAM: PORTABLE CHEST - 1 VIEW  COMPARISON:  Yesterday  FINDINGS: Vascular congestion and mild interstitial edema have developed. Cardiomegaly. Low volumes. No pneumothorax.  IMPRESSION: Interval development of mild edema.   Electronically Signed   By: Maryclare Bean M.D.   On: 12/11/2013 07:54    Medications:  Scheduled: . albuterol  2.5 mg Nebulization Q6H  . atorvastatin  10 mg Oral q1800  . cefTRIAXone (ROCEPHIN)  IV  1 g Intravenous Q24H  . docusate sodium  100 mg Oral BID  . enoxaparin (LOVENOX) injection  40 mg Subcutaneous Q24H  . fluconazole  100 mg Oral Daily  . furosemide  40 mg Intravenous Once  . insulin aspart  0-5 Units Subcutaneous QHS  . insulin aspart  0-9 Units Subcutaneous TID WC  . insulin glargine  40 Units Subcutaneous QHS  . ipratropium  0.5 mg Nebulization Q6H  . levothyroxine  25 mcg Oral BH-q7a  . phenazopyridine  100 mg Oral TID WC  . sodium chloride  3 mL Intravenous  Q12H  . trimethoprim  100 mg Oral QHS   Continuous: . sodium chloride 75 mL/hr at 12/11/13 1456   ZOX:[id number], acetaminophen, alum & mag hydroxide-simeth, diphenhydrAMINE, diphenhydrAMINE, HYDROcodone-acetaminophen, LORazepam, ondansetron (ZOFRAN) IV, ondansetron, traMADol  Assesment: I think he has multifactorial hypoxia. There appears to be some element of CHF. She has COPD. She has sleep apnea and is not using CPAP although she tried briefly today. She has what may be a yeast urinary tract infection. Principal Problem:   Hypoxia Active Problems:   Hypothyroidism   DIABETES MELLITUS, TYPE II   OBESITY   HYPERTENSION   Arteriosclerotic cardiovascular disease (ASCVD)   SLEEP APNEA   Chronic back pain   Chronic obstructive pulmonary disease   Psoriasis   TIA (transient ischemic attack)   UTI (lower urinary tract infection)   Weakness generalized    Plan: I gave her another dose of Lasix. She will continue her other treatments. She'll have an echocardiogram. I started her on Diflucan.    LOS: 2 days   Pride Gonzales L 12/12/2013, 10:01 AM

## 2013-12-13 LAB — GLUCOSE, CAPILLARY: Glucose-Capillary: 174 mg/dL — ABNORMAL HIGH (ref 70–99)

## 2013-12-13 MED ORDER — CLOBETASOL PROPIONATE 0.05 % EX OINT
TOPICAL_OINTMENT | Freq: Two times a day (BID) | CUTANEOUS | Status: DC
  Administered 2013-12-13 – 2013-12-14 (×3): via TOPICAL
  Filled 2013-12-13: qty 15

## 2013-12-13 NOTE — Progress Notes (Signed)
Subjective: She says she feels better. She has less shortness of breath and less dysuria.  Objective: Vital signs in last 24 hours: Temp:  [98.2 F (36.8 C)-98.8 F (37.1 C)] 98.4 F (36.9 C) (12/22 0507) Pulse Rate:  [63-67] 63 (12/22 0507) Resp:  [20] 20 (12/22 0507) BP: (123-154)/(51-68) 154/51 mmHg (12/22 0507) SpO2:  [89 %-100 %] 90 % (12/22 0715) FiO2 (%):  [21 %] 21 % (12/22 0715) Weight change:  Last BM Date: 12/08/13  Intake/Output from previous day: 12/21 0701 - 12/22 0700 In: 1515 [P.O.:240; I.V.:1275] Out: 500 [Urine:500]  PHYSICAL EXAM General appearance: alert, cooperative, mild distress and morbidly obese Resp: rhonchi bilaterally Cardio: regular rate and rhythm, S1, S2 normal, no murmur, click, rub or gallop GI: soft, non-tender; bowel sounds normal; no masses,  no organomegaly Extremities: extremities normal, atraumatic, no cyanosis or edema  Lab Results:    Basic Metabolic Panel:  Recent Labs  16/10/96 0613 12/12/13 0604  NA 136 135  K 3.9 3.8  CL 101 98  CO2 27 27  GLUCOSE 152* 155*  BUN 8 7  CREATININE 0.73 0.73  CALCIUM 8.2* 8.4   Liver Function Tests: No results found for this basename: AST, ALT, ALKPHOS, BILITOT, PROT, ALBUMIN,  in the last 72 hours No results found for this basename: LIPASE, AMYLASE,  in the last 72 hours No results found for this basename: AMMONIA,  in the last 72 hours CBC:  Recent Labs  12/11/13 0613  WBC 7.2  HGB 11.4*  HCT 37.5  MCV 83.7  PLT 221   Cardiac Enzymes: No results found for this basename: CKTOTAL, CKMB, CKMBINDEX, TROPONINI,  in the last 72 hours BNP:  Recent Labs  12/12/13 0604  PROBNP 1377.0*   D-Dimer: No results found for this basename: DDIMER,  in the last 72 hours CBG:  Recent Labs  12/11/13 1705 12/11/13 2114 12/12/13 0738 12/12/13 1128 12/12/13 1654 12/12/13 2136  GLUCAP 158* 162* 169* 170* 158* 174*   Hemoglobin A1C:  Recent Labs  12/10/13 1050  HGBA1C 8.8*    Fasting Lipid Panel: No results found for this basename: CHOL, HDL, LDLCALC, TRIG, CHOLHDL, LDLDIRECT,  in the last 72 hours Thyroid Function Tests:  Recent Labs  12/10/13 1050  TSH 0.587   Anemia Panel: No results found for this basename: VITAMINB12, FOLATE, FERRITIN, TIBC, IRON, RETICCTPCT,  in the last 72 hours Coagulation: No results found for this basename: LABPROT, INR,  in the last 72 hours Urine Drug Screen: Drugs of Abuse  No results found for this basename: labopia, cocainscrnur, labbenz, amphetmu, thcu, labbarb    Alcohol Level: No results found for this basename: ETH,  in the last 72 hours Urinalysis:  Recent Labs  12/10/13 1430  COLORURINE YELLOW  LABSPEC >1.030*  PHURINE 6.0  GLUCOSEU 250*  HGBUR NEGATIVE  BILIRUBINUR NEGATIVE  KETONESUR TRACE*  PROTEINUR NEGATIVE  UROBILINOGEN 0.2  NITRITE NEGATIVE  LEUKOCYTESUR SMALL*   Misc. Labs:  ABGS No results found for this basename: PHART, PCO2, PO2ART, TCO2, HCO3,  in the last 72 hours CULTURES Recent Results (from the past 240 hour(s))  URINE CULTURE     Status: None   Collection Time    12/09/13  1:30 AM      Result Value Range Status   Specimen Description URINE, CLEAN CATCH   Final   Special Requests NONE   Final   Culture  Setup Time     Final   Value: 12/09/2013 02:00  Performed at [facility] Count     Final   Value: 5,000 COLONIES/ML     Performed at [facility]     Final   Value: INSIGNIFICANT GROWTH     Performed at [facility]   Report Status 12/10/2013 FINAL   Final  URINE CULTURE     Status: None   Collection Time    12/10/13  2:30 PM      Result Value Range Status   Specimen Description URINE, CLEAN CATCH   Final   Special Requests NONE   Final   Culture  Setup Time     Final   Value: 12/11/2013 00:13     Performed at [facility] Count     Final   Value: >=100,000 COLONIES/ML     Performed at [facility]   Culture     Final   Value: YEAST CONSISTENT WITH CANDIDA SPECIES     Performed at [facility]   Report Status 12/12/2013 FINAL   Final   Studies/Results: Dg Chest 2 View  12/12/2013   CLINICAL DATA:  Shortness of breath.  EXAM: CHEST  2 VIEW  COMPARISON:  12/11/2013.  FINDINGS: Cardiomegaly is again noted. Previously identified pulmonary venous congestion and interstitial pulmonary edema has partially cleared. Prior CABG. No focal acute infiltrate or pleural effusion. No pneumothorax. Postsurgical changes right proximal humerus and shoulder.  IMPRESSION: Interim partial clearing of congestive heart failure and pulmonary interstitial edema.   Electronically Signed   By: Maisie Fus  Register   On: 12/12/2013 12:16    Medications:  Scheduled: . albuterol  2.5 mg Nebulization Q6H  . atorvastatin  10 mg Oral q1800  . cefTRIAXone (ROCEPHIN)  IV  1 g Intravenous Q24H  . docusate sodium  100 mg Oral BID  . enoxaparin (LOVENOX) injection  40 mg Subcutaneous Q24H  . fluconazole  100 mg Oral Daily  . insulin aspart  0-5 Units Subcutaneous QHS  . insulin aspart  0-9 Units Subcutaneous TID WC  . insulin glargine  40 Units Subcutaneous QHS  . ipratropium  0.5 mg Nebulization Q6H  . levothyroxine  25 mcg Oral BH-q7a  . phenazopyridine  100 mg Oral TID WC  . sodium chloride  3 mL Intravenous Q12H  . trimethoprim  100 mg Oral QHS   Continuous: . sodium chloride 75 mL/hr at 12/12/13 2203   ZOX:[id number], acetaminophen, alum & mag hydroxide-simeth, diphenhydrAMINE, diphenhydrAMINE, HYDROcodone-acetaminophen, LORazepam, ondansetron (ZOFRAN) IV, ondansetron, traMADol  Assesment: She seems better. I think she has some element of CHF. She had echocardiogram but we don't have report yet. She is willing to try some CPAP for sleep apnea Principal Problem:   Hypoxia Active Problems:   Hypothyroidism   DIABETES MELLITUS, TYPE II   OBESITY   HYPERTENSION   Arteriosclerotic  cardiovascular disease (ASCVD)   SLEEP APNEA   Chronic back pain   Chronic obstructive pulmonary disease   Psoriasis   TIA (transient ischemic attack)   UTI (lower urinary tract infection)   Weakness generalized    Plan: No change in medicines. Continue treatments.    LOS: 3 days   Viktor Philipp L 12/13/2013, 8:53 AM

## 2013-12-13 NOTE — Progress Notes (Signed)
Talked with patient about wearing CPAP machine tonight. She states she has one at home she should wear but doesn't and one was brought to her hear and she tried to wear it but couldn't. She doesn't want to try again.

## 2013-12-13 NOTE — Progress Notes (Signed)
NAME:  [name] [name], [name] [name]              ACCOUNT NO.:  [id number]  MEDICAL RECORD NO.:  [id number]  LOCATION:  A309                          FACILITY:  [facility]  PHYSICIAN:  Gibson Lad G. Renard Matter, MD   DATE OF BIRTH:  24-Jun-1936  DATE OF PROCEDURE: DATE OF DISCHARGE:                                PROGRESS NOTE   This patient is alert today.  She was admitted with COPD and hypoxia, also urinary tract infection and generalized weakness.  She has diabetes mellitus type 2, insulin dependent, sleep apnea, and psoriasis.  PHYSICAL EXAMINATION:  GENERAL:  The patient is alert. VITAL SIGNS:  Blood pressure 114/61, respirations 20, pulse 75, temp 102.6. HEENT:  Eyes, PERRLA.  TM negative.  Oropharynx benign. NECK:  Supple.  No JVD or thyroid abnormalities. HEART:  Regular rhythm.  No murmurs. LUNGS:  Clear to P and A. ABDOMEN:  Obese.  No palpable organs or masses. EXTREMITIES:  Free of edema. SKIN:  Shows evidence of psoriasis in various areas.  ASSESSMENT AND PLAN:  The patient has a history of chronic obstructive pulmonary disease, possible early pneumonia.  She continues to run some fever.  Plan to continue her current medications.  A repeat chest x-rays showed some evidence of early edema and she was given Lasix last evening.  Ordered proBNP.  Urinary tract infection.  Plan to continue IV Rocephin, await reports on cultures.  Diabetes mellitus.  Plan to continue Lantus insulin 35 units daily and NovoLog by sliding scale.  We will obtain Pulmonary consult as well.     [name] [name] G. Renard Matter, MD     AGM/MEDQ  D:  12/12/2013  T:  12/13/2013  Job:  [id number]

## 2013-12-13 NOTE — Progress Notes (Signed)
NAME:  [name] [name], [name] [name]              ACCOUNT NO.:  [id number]  MEDICAL RECORD NO.:  [id number]  LOCATION:  A309                          FACILITY:  [facility]  PHYSICIAN:  Rayah Fines G. Renard Matter, MD   DATE OF BIRTH:  March 14, 1936  DATE OF PROCEDURE: DATE OF DISCHARGE:                                PROGRESS NOTE   This patient seems to be feeling better this morning.  She has had less dyspnea.  She was admitted with COPD, hypoxia, urinary tract infection, generalized weakness.  She has diabetes mellitus type 2, insulin dependent, sleep apnea, and psoriasis.  There was concerned that she had mild edema on chest x-ray.  OBJECTIVE:  VITAL SIGNS:  Blood pressure 154/51, respirations 20, pulse 63, temp 98.4. HEENT:  PERRLA.  TM negative.  Oropharynx benign. NECK:  Supple.  No JVD or thyroid abnormalities. HEART:  Regular rhythm.  No murmurs. LUNGS:  Clear to P and A. ABDOMEN:  Obese.  No palpable organs or masses. EXTREMITIES:  Free of edema. SKIN:  Shows evidence of psoriasis.  ASSESSMENT:  The patient does have chronic obstructive pulmonary disease and possible early pneumonia.  It showed evidence of early edema that she has been given Lasix.  Remains on Rocephin.  2D echo cardiogram pending.  ProBNP slightly elevated.  She does have diabetes mellitus as well and remains on 40 units of insulin Lantus and NovoLog sliding scale.  We will continue current antibiotics.  Continue neb treatments, nasal O2.     Joletta Manner G. Renard Matter, MD     AGM/MEDQ  D:  12/13/2013  T:  12/13/2013  Job:  [id number]

## 2013-12-13 NOTE — Clinical Documentation Improvement (Signed)
THIS DOCUMENT IS NOT A PERMANENT PART OF THE MEDICAL RECORD  Please update your documentation with the medical record to reflect your response to this query. If you need help knowing how to do this please call [phone].  12/13/13  Dear Dr.McInnis Marton Redwood,  A review of the patient medical record has revealed the following indicators.    Based on your clinical judgment, please clarify and document in a progress note and/or discharge summary the clinical condition associated with the following supporting information:   Possible Clinical Conditions?  Acute Respiratory Failure Acute on Chronic Respiratory Failure Chronic Respiratory Failure Other Condition________________ Cannot Clinically Determine    Supporting Information:  Risk Factors: History of COPD, CAD, Hypertension & Diabetes Admitted with hypoxia & possible pneumonia   Signs&Symptoms: Weakness Hypoxia  Fever O2 sat dropped to 86% Resp 22  Treatment: Pulmonary consult Proventil neb q6h Atrovent neb q6h CPAP Incentive spirometry O2 therapy Keep sats >92% Pulse Ox with VS   You may use possible, probable, or suspect with inpatient documentation. possible, probable, suspected diagnoses MUST be documented at the time of discharge  Reviewed:  no additional documentation provided  Thank Angelene Giovanni  Clinical Documentation Specialist: [phone] Health Information Management [place]

## 2013-12-13 NOTE — Progress Notes (Signed)
PT Screen Note  Patient Details Name: [name] [name] MRN: [id number] DOB: 03/03/1936   Cancelled Treatment:    Reason Eval/Treat Not Completed: PT screened, no needs identified, will sign off.  Pt just returned to room with Nurse Tech from walking in hallways and reported SaO2 sats remained at 93%.  No further PT required.    Grady Mohabir 12/13/2013, 11:30 AM

## 2013-12-13 NOTE — Care Management Note (Addendum)
    Page 1 of 1   12/14/2013     10:29:44 AM   CARE MANAGEMENT NOTE 12/14/2013  Patient:  [name] [name], [name] [name]   Account Number:  [id number]  Date Initiated:  12/13/2013  Documentation initiated by:  Sharrie Rothman  Subjective/Objective Assessment:   Pt admitted from home with UTI and COPD. Pt lives with her husband and will return home at discharge. Pt is independent with ADL's. Pt has cpap for home use.     Action/Plan:   No CM needs noted.   Anticipated DC Date:  12/15/2013   Anticipated DC Plan:  HOME/SELF CARE      DC Planning Services  CM consult      Choice offered to / List presented to:             Status of service:  Completed, signed off Medicare Important Message given?  YES (If response is "NO", the following Medicare IM given date fields will be blank) Date Medicare IM given:  12/14/2013 Date Additional Medicare IM given:    Discharge Disposition:  HOME/SELF CARE  Per UR Regulation:    If discussed at Long Length of Stay Meetings, dates discussed:    Comments:  12/14/13 1030 Arlyss Queen, RN BSN CM Pt discharged home today. no CM needs noted.  12/13/13 1430 Arlyss Queen, RN BSN CM

## 2013-12-14 LAB — GLUCOSE, CAPILLARY

## 2013-12-14 MED ORDER — FLUCONAZOLE 100 MG PO TABS: 100.0000 mg | ORAL_TABLET | Freq: Every day | ORAL | Status: DC

## 2013-12-14 MED ORDER — PHENAZOPYRIDINE HCL 100 MG PO TABS: 100.0000 mg | ORAL_TABLET | Freq: Three times a day (TID) | ORAL | Status: DC

## 2013-12-14 NOTE — Discharge Summary (Signed)
NAME:  [name] [name], [name] [name]              ACCOUNT NO.:  [id number]  MEDICAL RECORD NO.:  [id number]  LOCATION:  A309                          FACILITY:  [facility]  PHYSICIAN:  Aileena Iglesia G. Renard Matter, MD   DATE OF BIRTH:  11-16-1936  DATE OF ADMISSION:  12/10/2013 DATE OF DISCHARGE:  LH                              DISCHARGE SUMMARY   HISTORY OF PRESENT ILLNESS:  This 77 year old female was admitted with generalized weakness, cough, and shortness of breath.  She apparently had an episode of hypoxia when her sats dropped to 86%, was given nebulizers in the ED, and sats increased to 95%.  She does have a history of COPD, diabetes, hypertension, coronary artery disease status post CABG.  She had been experiencing some dysuria prior to admission as well and nausea and vomiting.  PHYSICAL EXAMINATION:  VITAL SIGNS:  On admission, uncomfortable female with blood pressure 102/63, pulse 72, temp 98, respirations 22. HEENT:  Eyes, PERRLA.  TM, negative.  Oropharynx benign. NECK:  Supple.  No JVD or thyroid abnormalities. HEART:  Regular rhythm.  No murmurs. LUNGS:  Occasional rhonchus heard over lower lung fields. ABDOMEN:  Obese.  No palpable organs or masses.  No organomegaly. SKIN:  Warm and dry.  Areas of psoriasis scattered over extremities. NEUROLOGIC:  No cranial nerve abnormalities.  No motor or sensory abnormalities.  LABORATORY DATA:  Chemistries on admission, CBC:  WBC 10,200 with hemoglobin 12.1, hematocrit 38.5.  Liver enzymes:  AST 17, ALT 15, alkaline phosphatase 72, bilirubin 0.3.  Sodium 138, potassium 3.3, chloride 101, CO2 27, glucose 86, BUN 9, creatinine 0.75, calcium 8.9. Subsequent CBC:  WBC 7.2, hemoglobin 12.0, hematocrit 38.5.  Subsequent chemistries:  Sodium 136, potassium 4, chloride 99, CO2 29, glucose 153, BUN 10, creatinine 0.73.  Hemoglobin A1c was 8.8.  Urine culture final results, yeast consistent with Candida species.  ProBNP was 1377. Glucose is ranged 158-201 and  225.  IMAGING:  Radiology:  Chest x-ray on this admission, no active cardiopulmonary disease.  Subsequent chest x-ray revealed ORIF in the right proximal humerus and right rotator cuff repair.  Renal ultrasound, no hydronephrosis, decreased size of right kidney.  Subsequent chest x- ray, interval development of mild edema and another x-ray following that showed clearing of CHF and pulmonary interstitial edema.  HOSPITAL COURSE:  The patient at the time of her admission was started on IV fluids, 0.9% sodium chloride.  She was continued on nebulizer treatments with albuterol every 6 hours.  She was continued on Lipitor 10 mg daily.  IV Rocephin was given 1 g every 24 hours throughout the hospital stay.  She was continued on Colace 100 mg b.i.d., Diflucan 100 mg daily, Lantus insulin 40 units daily with 2 units of NovoLog insulin t.i.d.  Atrovent nebulizers solution was given every 6 hours.  She was continued on Synthroid 75 mcg daily, Pyridium 100 mg t.i.d., trimethoprim 100 mg at bedtime.  She was given p.r.n. Norco 5/325 and p.r.n. Ativan 0.5 mg.  Also, she was placed on Lovenox 40 mg injection daily for DVT prophylaxis.  The patient during her hospital stay was found to have mild edema and she did have a ProBNP, which was  slightly elevated.  She did have increased number of colonies of yeast consistent with Candida species.  A ProBNP was 1377.  She was seen in consultation as well by Dr. Juanetta Gosling, who agreed with her treatment, suggested BiPAP, but the patient will not wear it at this stage.  She did have slightly low oxygen saturation on admission, but this improved.  After 4 days of hospitalization, it was felt she was stable enough to be discharged home.  She did have some problem with her psoriasis which have been a chronic type problem.  It was felt that she has had congestive heart failure with normal with ejection fraction of 66%.  A repeat echocardiogram showed a 65-70% ejection  fraction.  It was felt that she is stable enough to be discharged.     Arlynn Mcdermid G. Renard Matter, MD     AGM/MEDQ  D:  12/14/2013  T:  12/14/2013  Job:  [id number]

## 2013-12-14 NOTE — Progress Notes (Signed)
IV removed. Discharge instructions reviewed with patient. Understanding verbalized. Prescriptions given. Patient ready for discharge home.

## 2013-12-14 NOTE — Discharge Summary (Signed)
NAME:  [name] [name], [name] [name]              ACCOUNT NO.:  [id number]  MEDICAL RECORD NO.:  [id number]  LOCATION:  A309                          FACILITY:  [facility]  PHYSICIAN:  Jasmon Graffam G. Renard Matter, MD   DATE OF BIRTH:  02/27/1936  DATE OF ADMISSION:  12/10/2013 DATE OF DISCHARGE:  LH                              DISCHARGE SUMMARY   ADDENDUM:  The patient will be discharged on the following medications; 1. Pyridium 100 mg t.i.d. 2. Diflucan 100 mg daily. 3. Nitrostat 0.4 mg sublingual as needed. 4. Synthroid 25 mcg daily. 5. Metformin 500 mg b.i.d. 6. Lantus insulin 70 units at bedtime. 7. Trimpex 100 mg at bedtime. 8. Ativan 0.5 mg every 4 hours as needed. 9. Crestor 10 mg daily. 10.Tramadol 50 mg every 6 hours as needed. 11.Lisinopril/hydrochlorothiazide 1 tablet daily. 12.Norco 5/325 every 4 hours as needed for pain. 13.Keflex 500 mg 4 times daily. 14.Zestril 110 mg tablet daily. 15.Sanctura 20 mg daily. 16.Tylenol 500 mg tablets of 1000 mg every 6 hours as needed for     fever. 17.Humalog 20 units 3 times daily.     Jernie Schutt G. Renard Matter, MD     AGM/MEDQ  D:  12/14/2013  T:  12/14/2013  Job:  [id number]

## 2013-12-23 NOTE — Discharge Summary (Signed)
NAME:  Gerilyn NestleMCMILLION, Ameri              ACCOUNT NO.:  [id number]  MEDICAL RECORD NO.:  [id number]  LOCATION:  A309                          FACILITY:  [facility]  PHYSICIAN:  Gemini Bunte G. Renard MatterMcInnis, MD   DATE OF BIRTH:  Aug 23, 1936  DATE OF ADMISSION:  12/10/2013 DATE OF DISCHARGE:  12/23/2014LH                              DISCHARGE SUMMARY   ADDENDUM  This patient did not have documented bronchopneumonia with his last admission.     Mende Biswell G. Renard MatterMcInnis, MD     AGM/MEDQ  D:  12/22/2013  T:  12/23/2013  Job:  [id number]

## 2013-12-24 ENCOUNTER — Ambulatory Visit: Payer: Medicare Other | Admitting: Urology

## 2014-01-21 ENCOUNTER — Ambulatory Visit (INDEPENDENT_AMBULATORY_CARE_PROVIDER_SITE_OTHER): Payer: Medicare Other | Admitting: Urology

## 2014-01-21 DIAGNOSIS — N3020 Other chronic cystitis without hematuria: Secondary | ICD-10-CM

## 2014-01-21 DIAGNOSIS — N952 Postmenopausal atrophic vaginitis: Secondary | ICD-10-CM

## 2014-01-21 DIAGNOSIS — N3941 Urge incontinence: Secondary | ICD-10-CM

## 2014-02-02 IMAGING — MR MR HEAD W/O CM
6 of 8 series · 29 of 48 positions shown · non-contrast
Comparison: Head CT same day.  MRI 09/21/2007.

CLINICAL DATA: Right facial numbness.  Left lower extremity
weakness.

MRI HEAD WITHOUT CONTRAST
TECHNIQUE: Multiplanar, multiecho pulse sequences of the brain and
surrounding structures were obtained according to standard protocol
without intravenous contrast.

[Series 3: DWI · axial · 5.0mm · 0.72mm/px · z∈[-27,+122]mm · 4 of 24 slices shown (1 of 2)]
[im 1/24]
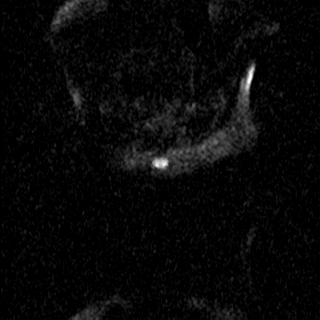
[im 8/24]
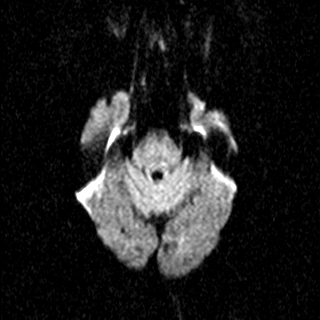
[im 16/24]
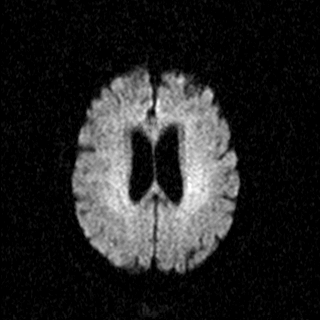
[im 24/24]
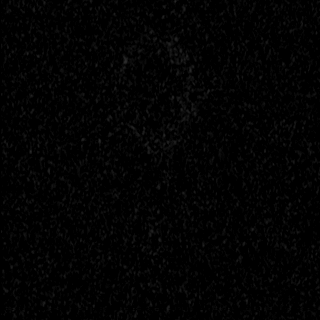

[Series 4: DWI · axial · 5.0mm · 0.72mm/px · z∈[-27,+122]mm · 4 of 24 slices shown (2 of 2)]
[im 1/24]
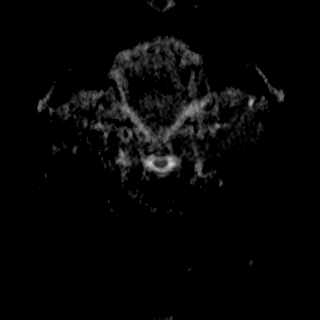
[im 8/24]
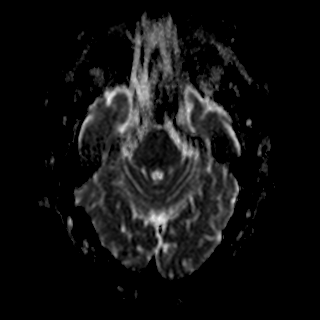
[im 16/24]
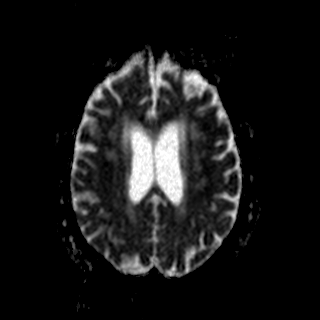
[im 24/24]
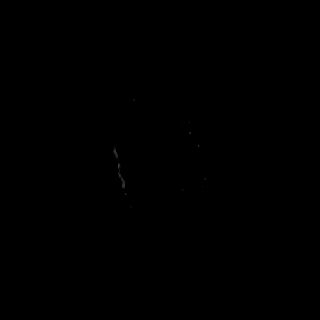

[Series 5: FLAIR · axial · 5.0mm · 0.62mm/px · z∈[-24,+126]mm · 5 of 26 slices shown]
[im 1/26]
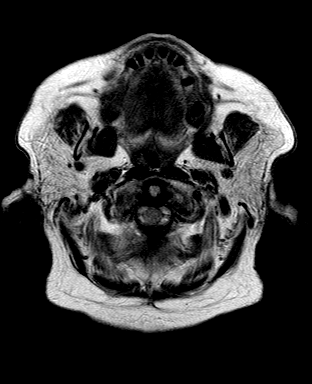
[im 7/26]
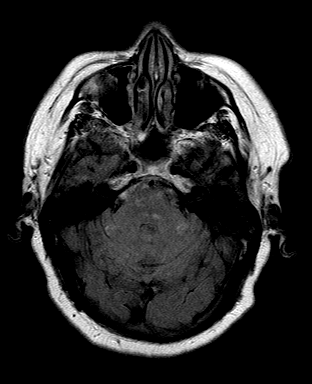
[im 13/26]
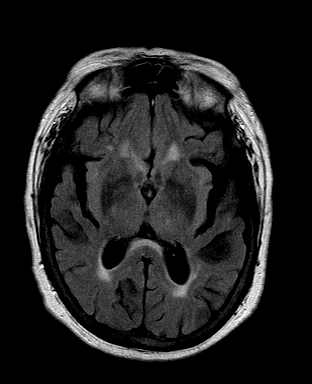
[im 19/26]
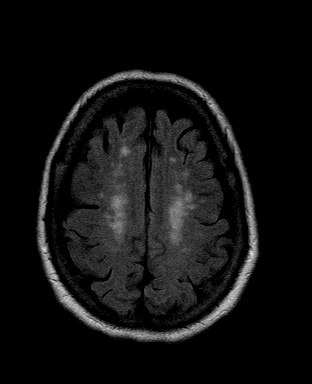
[im 26/26]
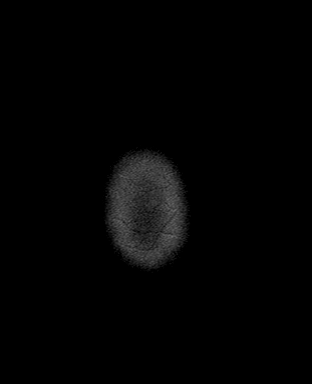

[Series 6: T2 · axial · 5.0mm · 0.62mm/px · z∈[-24,+126]mm · 5 of 26 slices shown (1 of 3)]
[im 1/26]
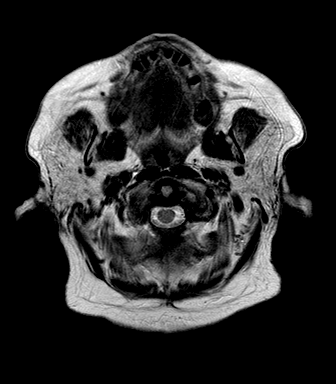
[im 7/26]
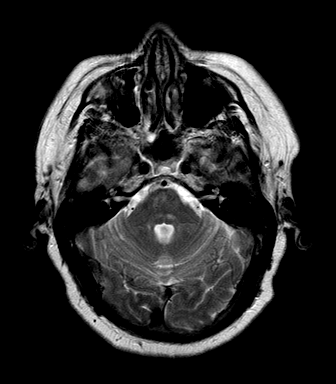
[im 13/26]
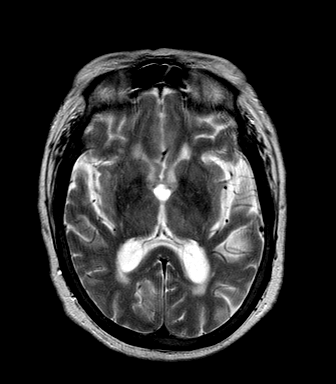
[im 19/26]
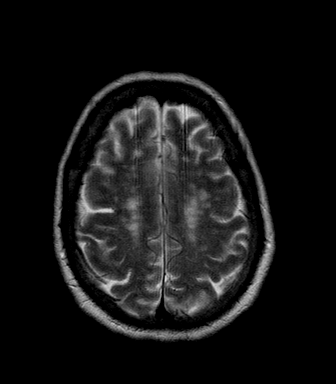
[im 26/26]
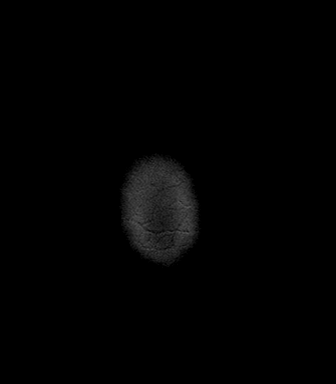

[Series 8: T2 · axial · 5.0mm · 0.47mm/px · z∈[-24,+126]mm · 5 of 26 slices shown (2 of 3)]
[im 1/26]
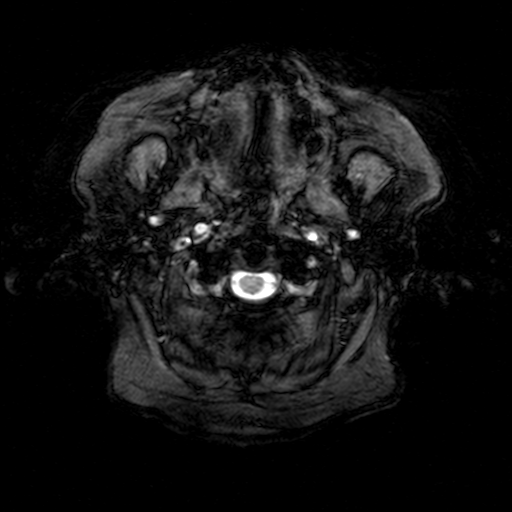
[im 7/26]
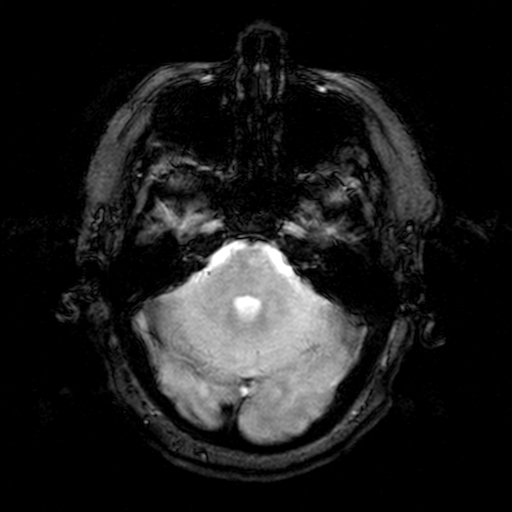
[im 13/26]
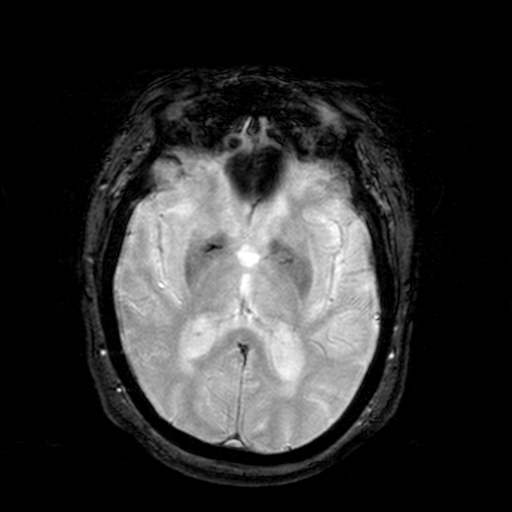
[im 19/26]
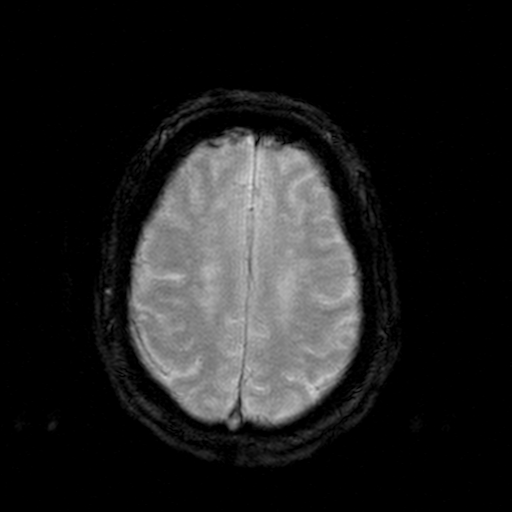
[im 26/26]
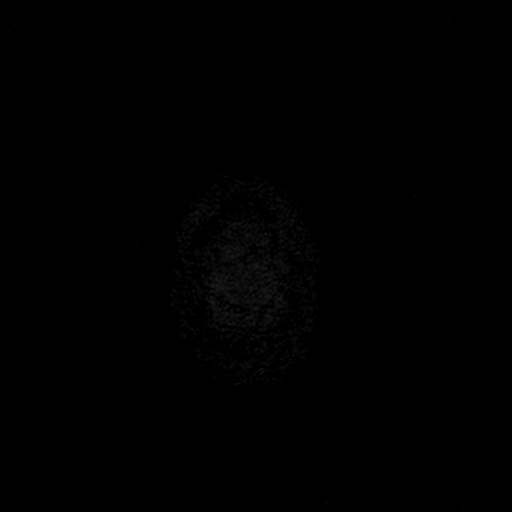

[Series 10: T2 · coronal · 5.0mm · 0.45mm/px · 6 of 30 slices shown (3 of 3)]
[im 1/30]
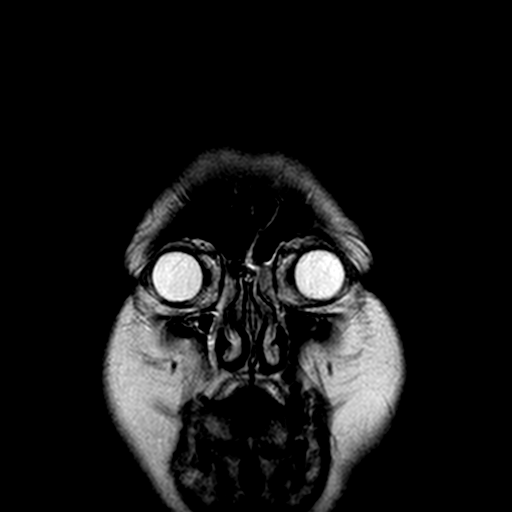
[im 6/30]
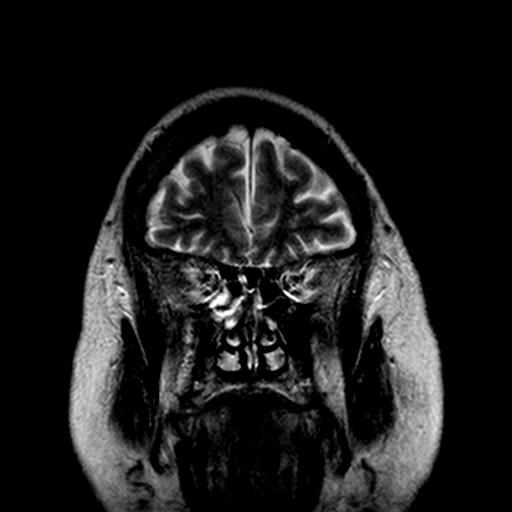
[im 12/30]
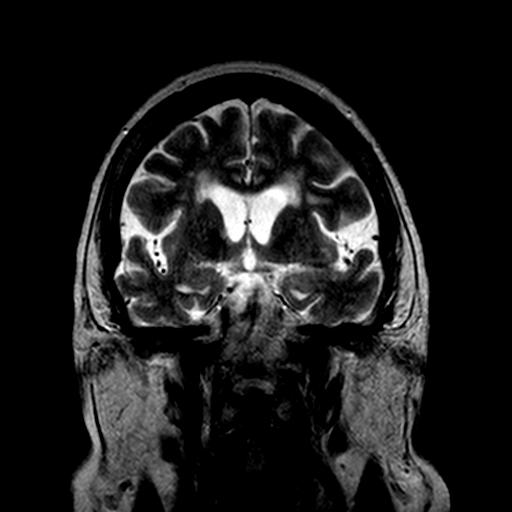
[im 18/30]
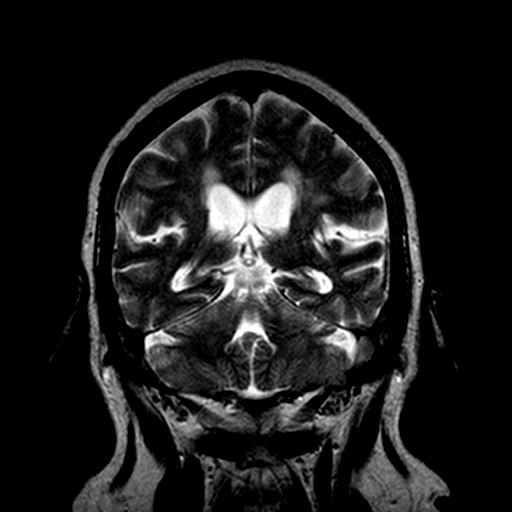
[im 24/30]
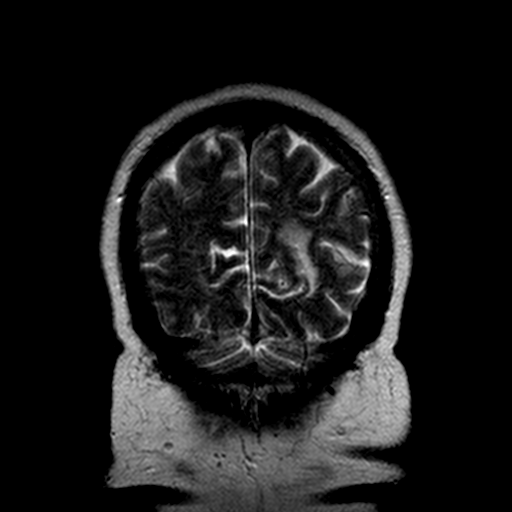
[im 30/30]
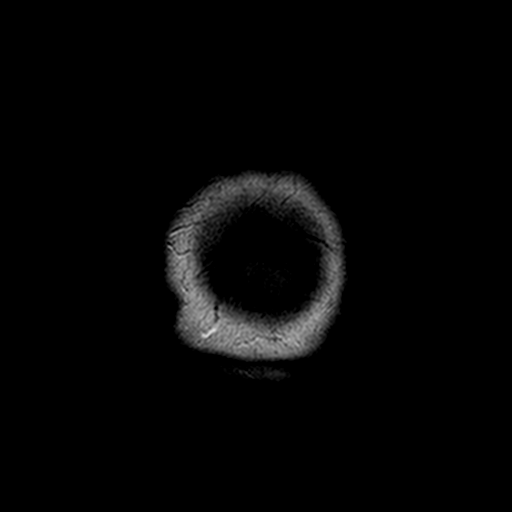

[29 of 48 positions shown; findings below may reference images not displayed]

FINDINGS: Diffusion imaging does not show any acute or subacute
infarction.  The brain shows generalized atrophy with extensive
chronic small vessel disease affecting the hemispheric white
matter, the basal ganglia and thalami and brainstem.  No large
vessel territory infarction is seen.  No mass lesion, hemorrhage,
hydrocephalus or extra-axial collection.  No pituitary mass.  No
advanced inflammatory sinus disease.  No skull or skull base
lesion.
IMPRESSION: Atrophy and chronic small vessel disease, progressive since 6333.
No evidence of acute or subacute lesion.

## 2014-02-02 IMAGING — CT CT HEAD W/O CM
1 series · 16 of 30 positions shown, 20 images · non-contrast
Comparison: None.

CLINICAL DATA: Headache and weakness.

CT HEAD WITHOUT CONTRAST
TECHNIQUE: Contiguous axial images were obtained from the base of
the skull through the vertex without contrast.

[Series 2: headseq 4.8 h37s · axial · 0.44mm/px · z∈[+65,+227]mm · 16 of 36 slices shown, 20 images]
[im 2/36  brain]
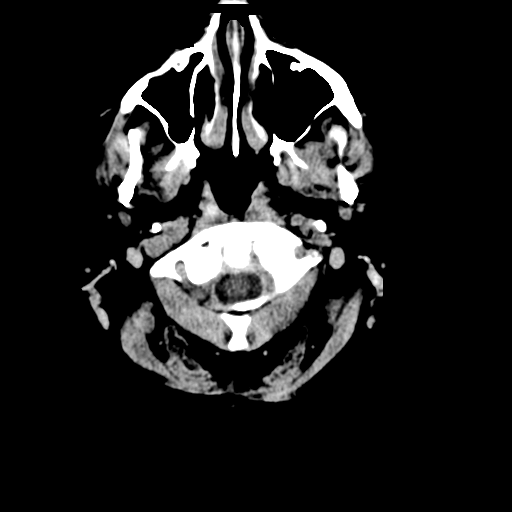
[im 2/36  bone]
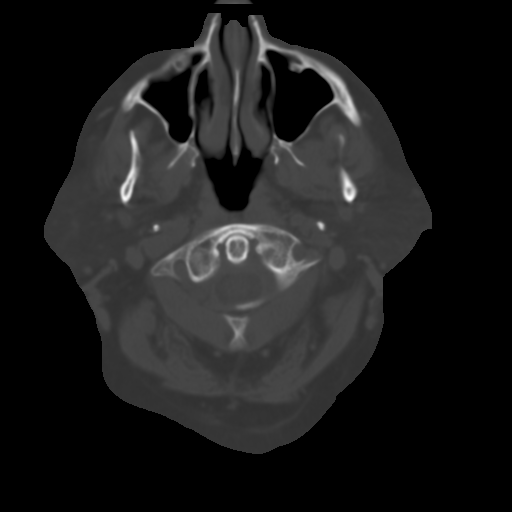
[im 4/36  brain]
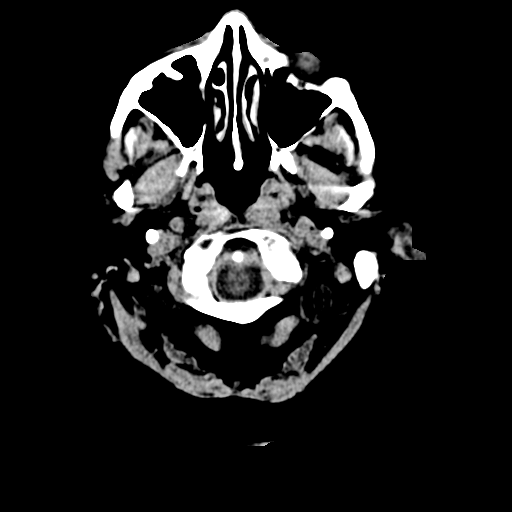
[im 7/36  brain]
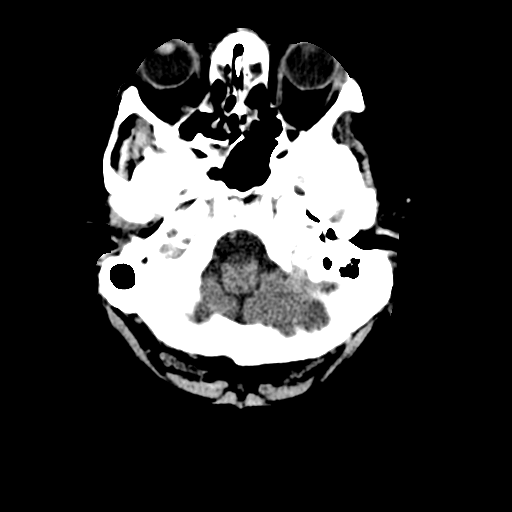
[im 9/36  brain]
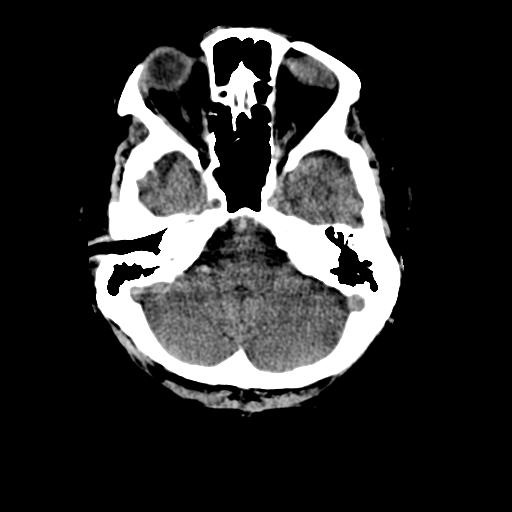
[im 10/36  brain]
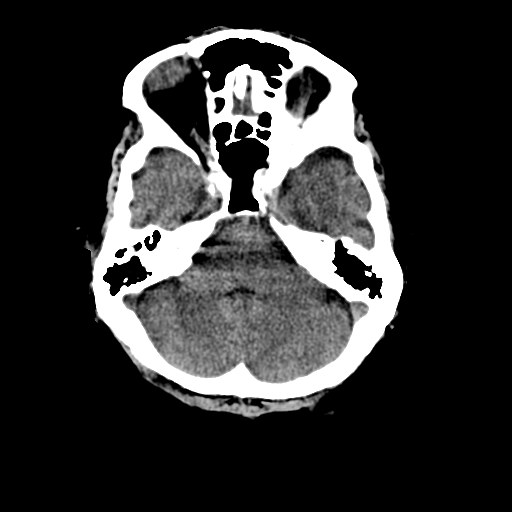
[im 10/36  bone]
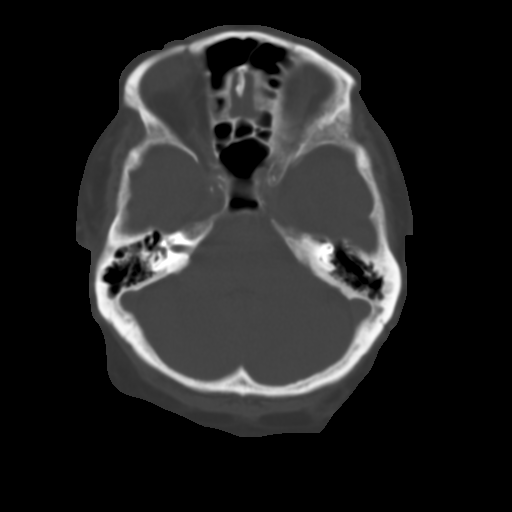
[im 13/36  brain]
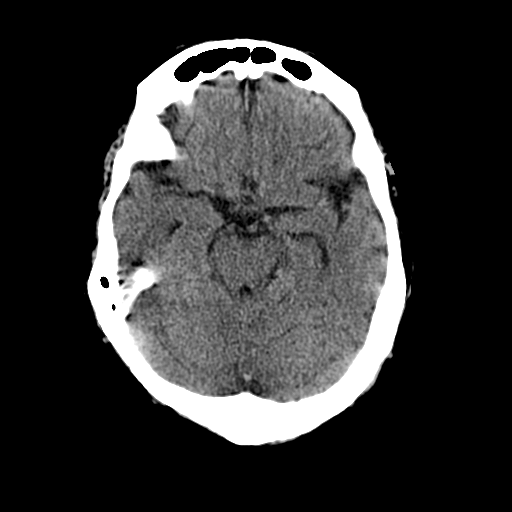
[im 15/36  brain]
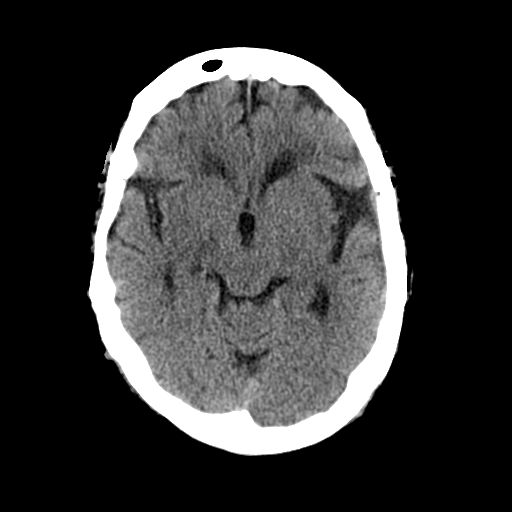
[im 17/36  brain]
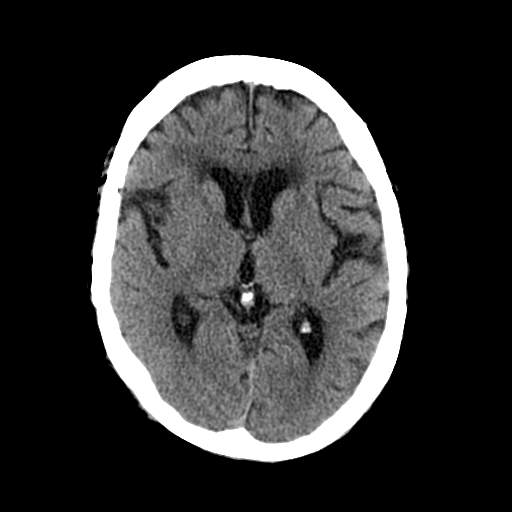
[im 19/36  brain]
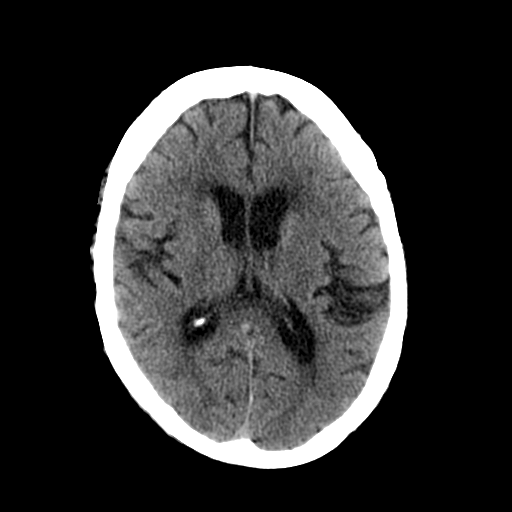
[im 19/36  bone]
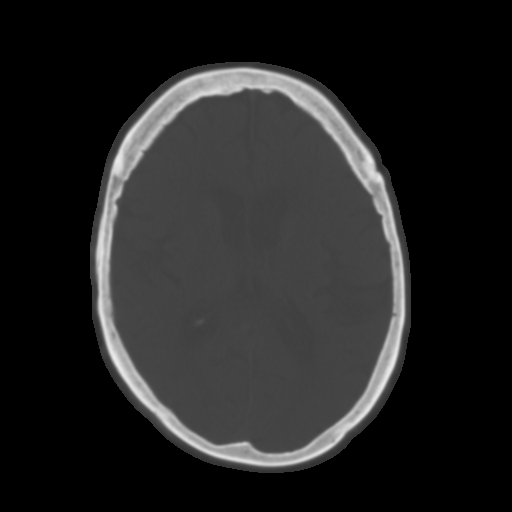
[im 21/36  brain]
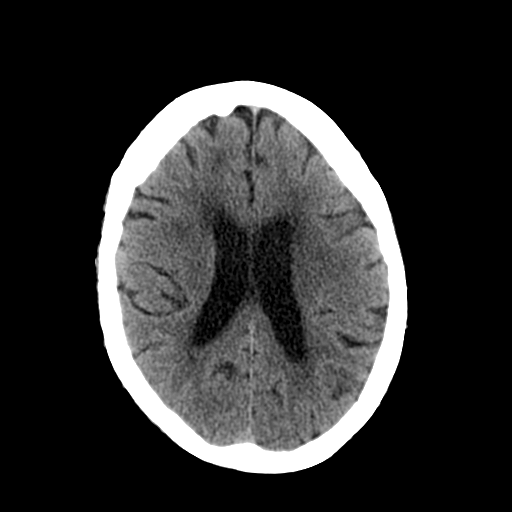
[im 23/36  brain]
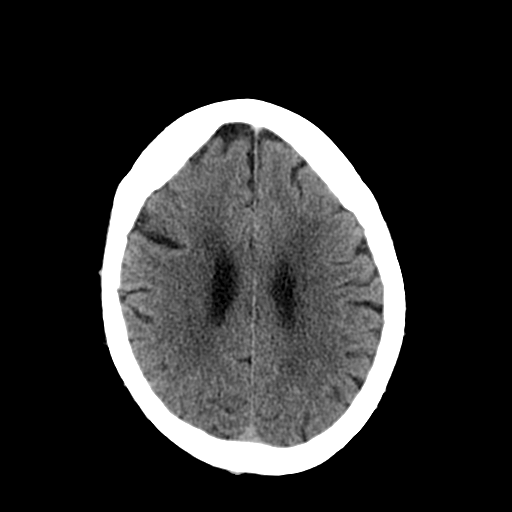
[im 26/36  brain]
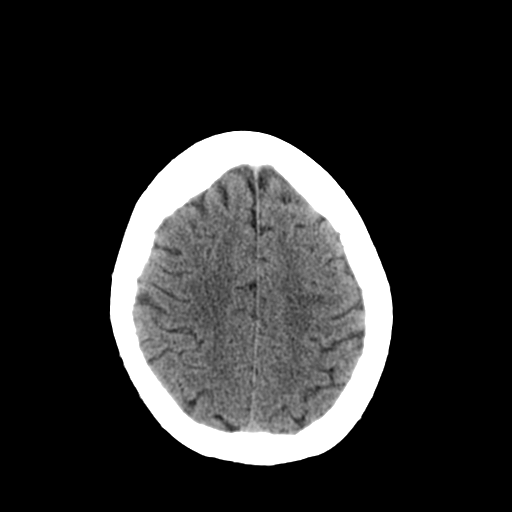
[im 27/36  brain]
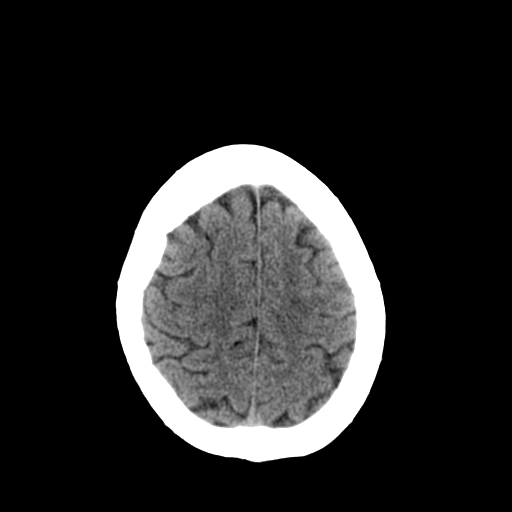
[im 27/36  bone]
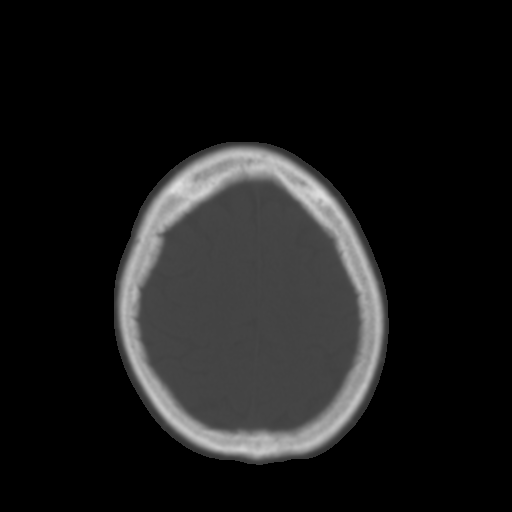
[im 29/36  brain]
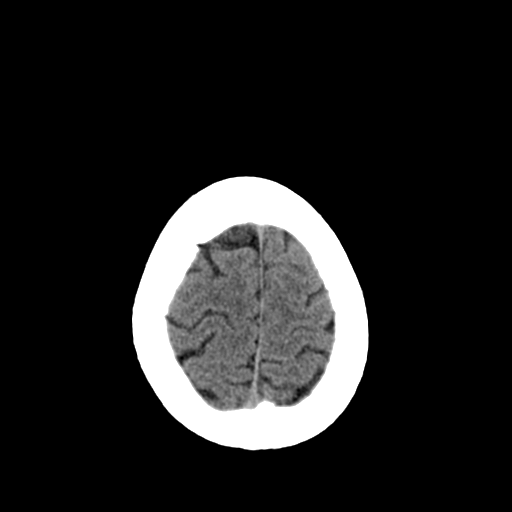
[im 32/36  brain]
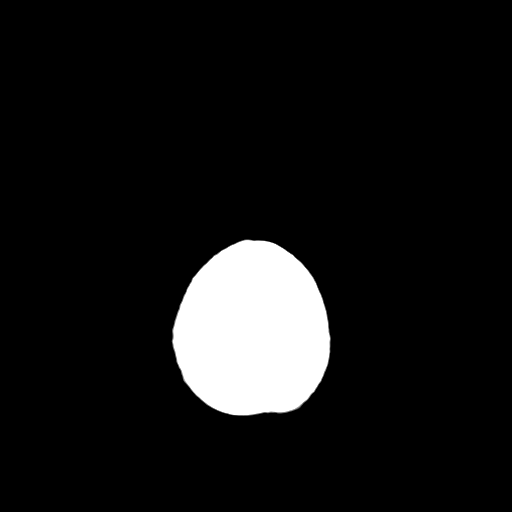
[im 34/36  brain]
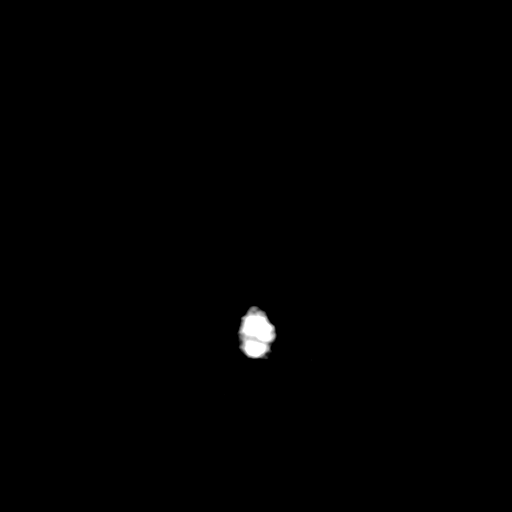

[16 of 30 positions shown; findings below may reference images not displayed]

FINDINGS: Small vessel ischemic changes are present in the
periventricular white matter. The brain demonstrates no evidence of
hemorrhage, infarction, edema, mass effect, extra-axial fluid
collection, hydrocephalus or mass lesion.  The skull is
unremarkable.
IMPRESSION: No acute findings.  Underlying small vessel disease.

## 2014-02-03 IMAGING — US US CAROTID DUPLEX BILAT
1 series · 13 of 24 positions shown · non-contrast
Comparison: Brain MRI - 01/29/2012;

CLINICAL DATA: Right-sided weakness, hypertension, diabetes, CAD
(prior CABG)

BILATERAL CAROTID DUPLEX ULTRASOUND
TECHNIQUE: Gray scale imaging, color Doppler and duplex ultrasound
was performed of bilateral carotid and vertebral arteries in the
neck.

[Series 1: us carotid duplex bilat · 0.08mm/px · 13 of 68 slices shown]
[im 1/68]
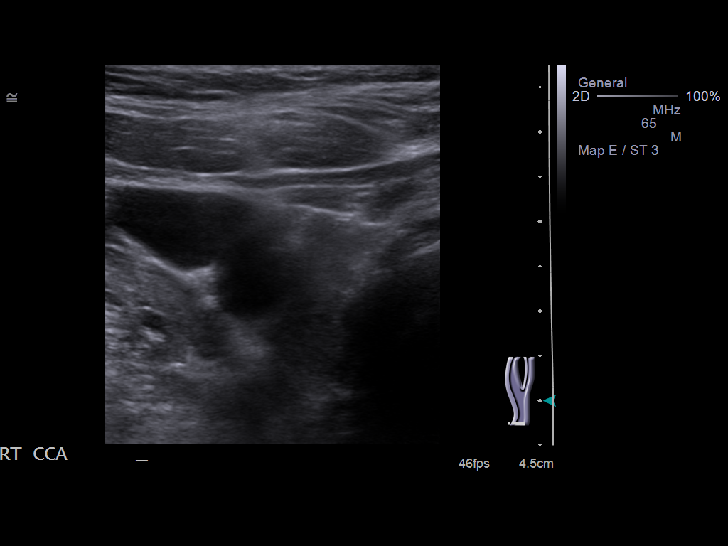
[im 6/68]
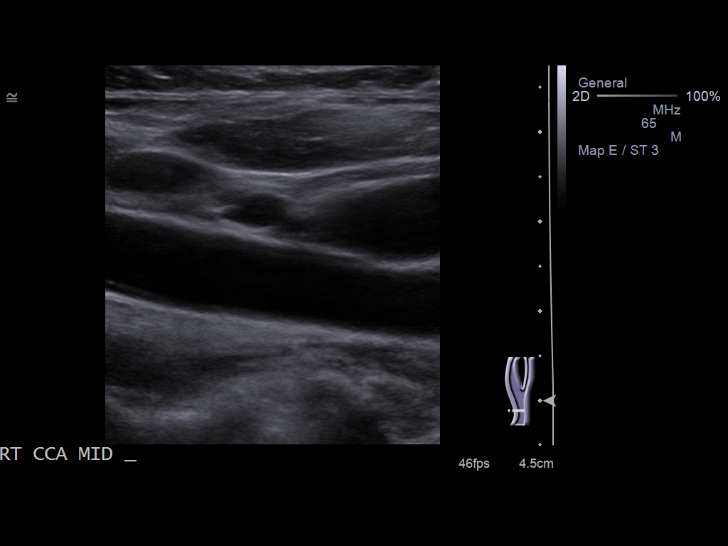
[im 12/68]
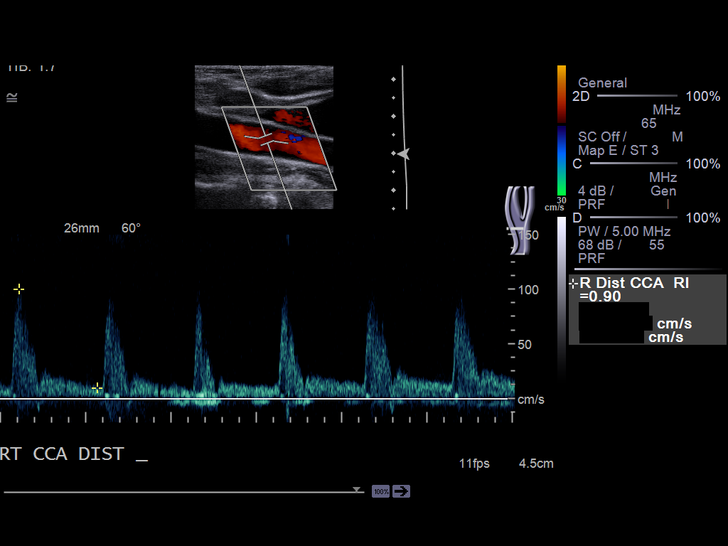
[im 18/68]
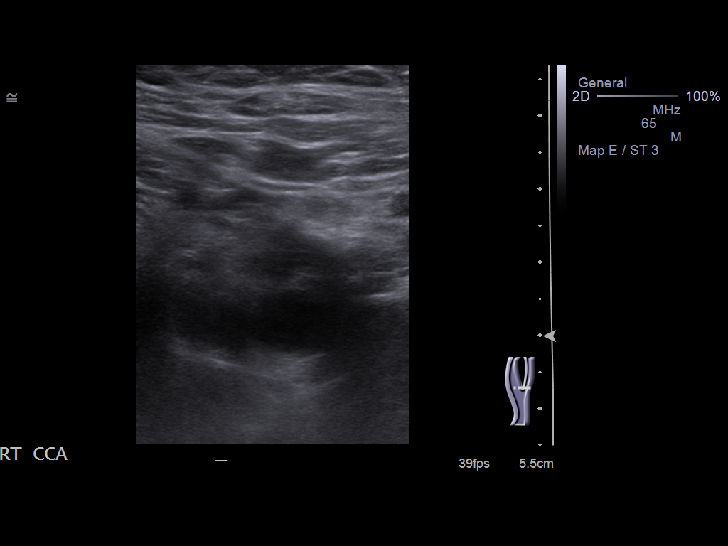
[im 24/68]
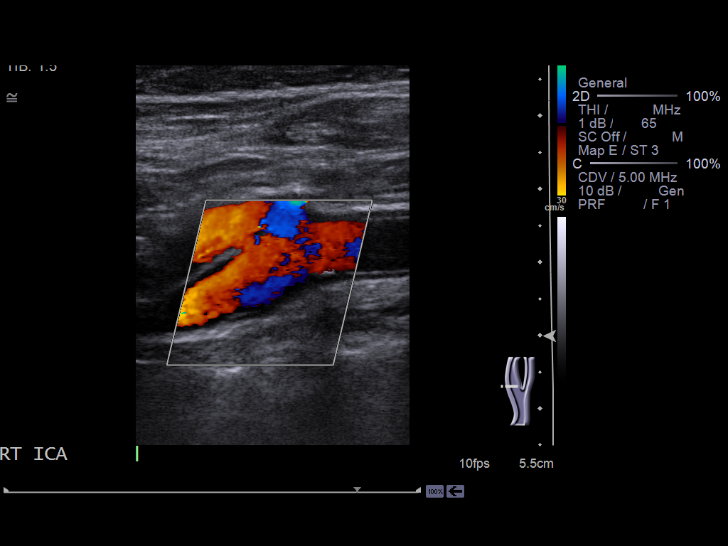
[im 30/68]
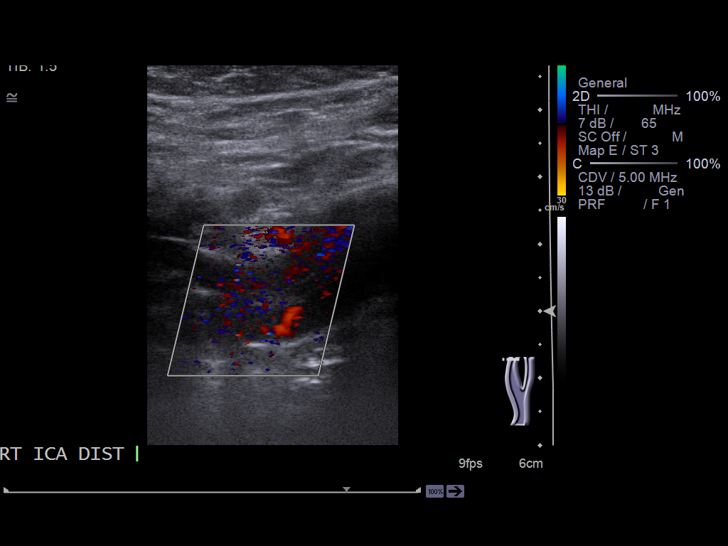
[im 35/68]
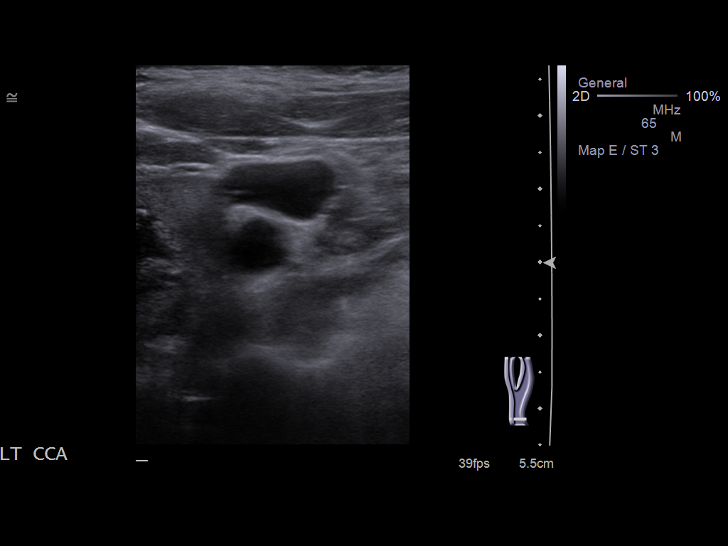
[im 38/68]
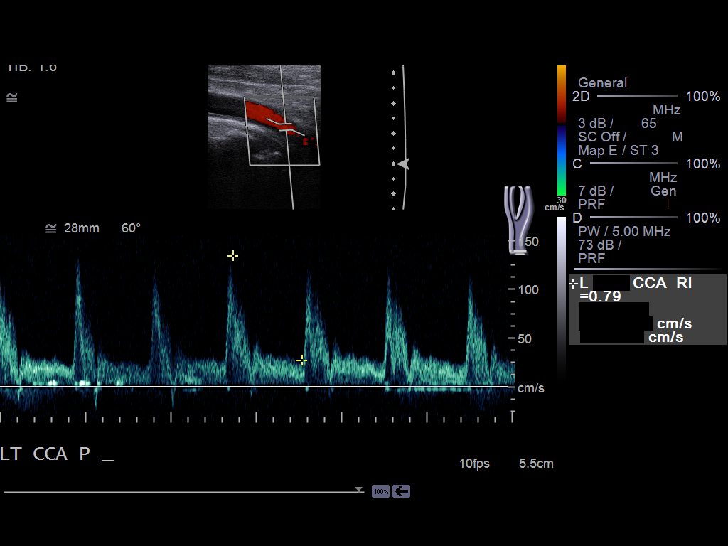
[im 44/68]
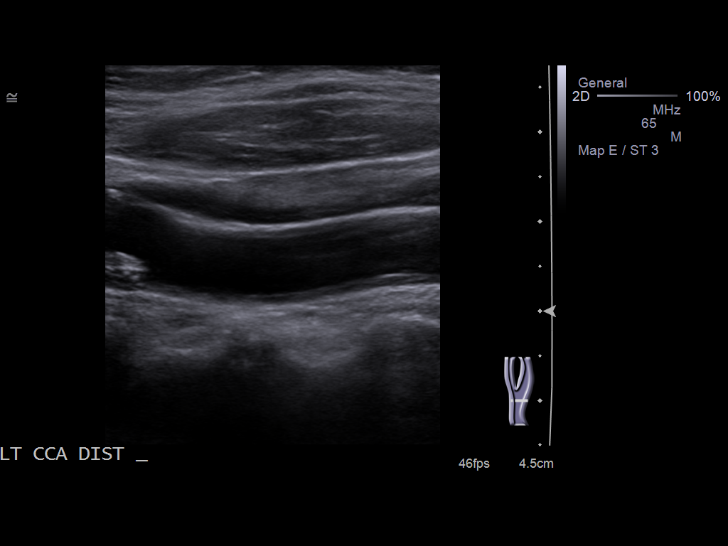
[im 50/68]
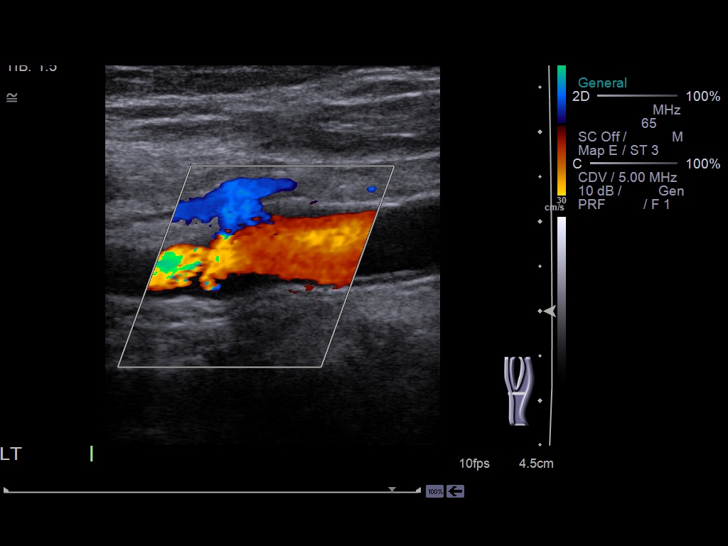
[im 56/68]
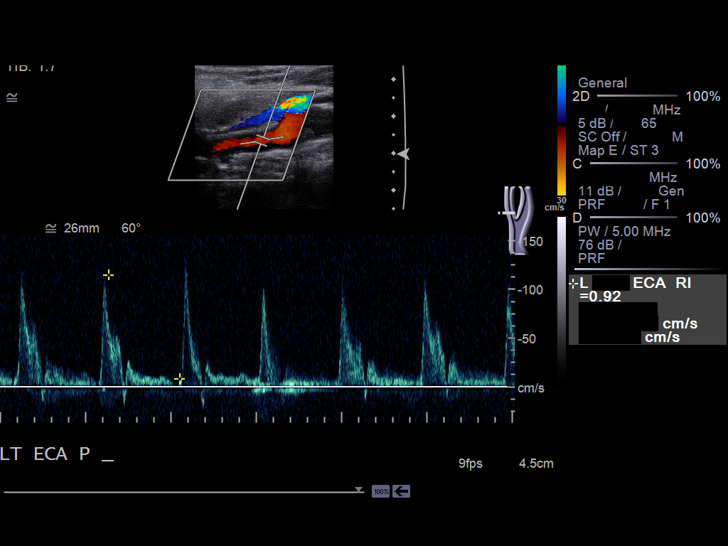
[im 62/68]
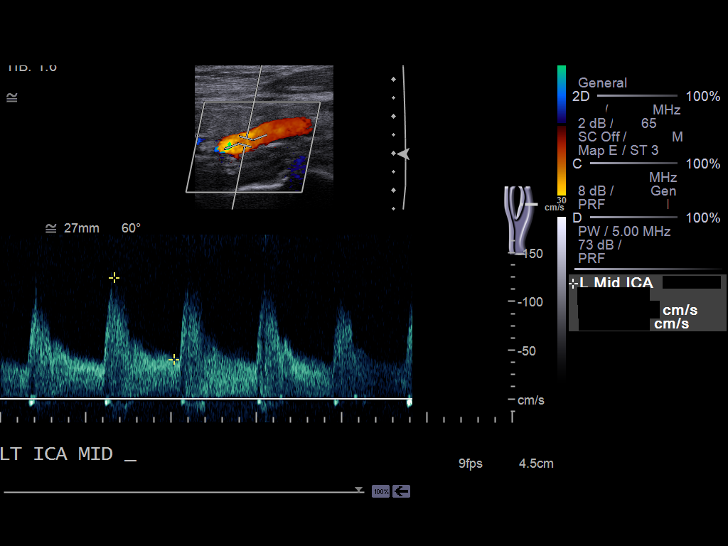
[im 68/68]
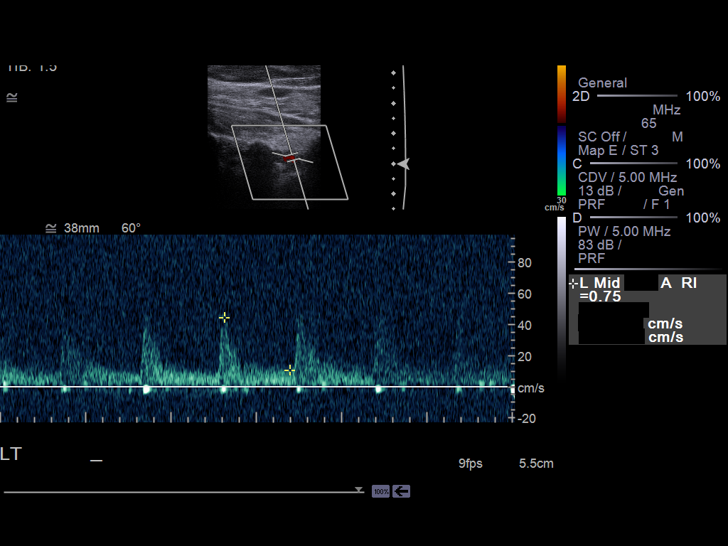

[13 of 24 positions shown; findings below may reference images not displayed]

brain MRA - 01/30/2012

Criteria:  Quantification of carotid stenosis is based on velocity
parameters that correlate the residual internal carotid diameter
with NASCET-based stenosis levels, using the diameter of the distal
internal carotid lumen as the denominator for stenosis measurement.

The following velocity measurements were obtained:

                 PEAK SYSTOLIC/END DIASTOLIC
RIGHT
ICA:                        102/16cm/sec
CCA:                        116/14cm/sec
SYSTOLIC ICA/CCA RATIO:
DIASTOLIC ICA/CCA RATIO:
ECA:                        156cm/sec

LEFT
ICA:                        125/41cm/sec
CCA:                        96/22cm/sec
SYSTOLIC ICA/CCA RATIO:
DIASTOLIC ICA/CCA RATIO:
ECA:                        116cm/sec
FINDINGS: RIGHT CAROTID ARTERY: Minimal amount of eccentric atherosclerotic
plaque within the right carotid bulb, not resulting in elevated
peak systolic velocities to suggest a hemodynamically significant
stenosis.

RIGHT VERTEBRAL ARTERY:  Preserved antegrade flow.

LEFT CAROTID ARTERY: There is a moderate amount of eccentric
primarily calcified atherosclerotic plaque within the carotid bulb
which results in borderline elevated peak systolic velocities of
the mid and distal aspect of the left internal carotid artery
(measuring 125 and 110 cm/sec respectively).

LEFT VERTEBRAL ARTERY:  Preserved antegrade flow.
IMPRESSION: 1.  Moderate amount of predominantly eccentric calcified
atherosclerotic plaque within the left carotid bulb results in
borderline elevated peak systolic velocities within the left
internal carotid artery likely approaching the lower end of 50-69%
luminal narrowing range.
2.  Minimal amount of atherosclerotic plaque within the right
carotid bulb, not resulting in hemodynamically significant
stenosis.

## 2014-02-03 IMAGING — MR MR MRA HEAD W/O CM
1 series · 16 of 48 positions shown · non-contrast
Comparison: MRI 01/29/2012

CLINICAL DATA: Dizziness and weakness.

MRA HEAD WITHOUT CONTRAST
TECHNIQUE: Angiographic images of the Circle of Willis were
obtained using MRA technique without intravenous contrast.

[Series 2: MRA · axial · 0.8mm · 0.33mm/px · z∈[-109,-1]mm · 16 of 141 slices shown]
[im 1/141]
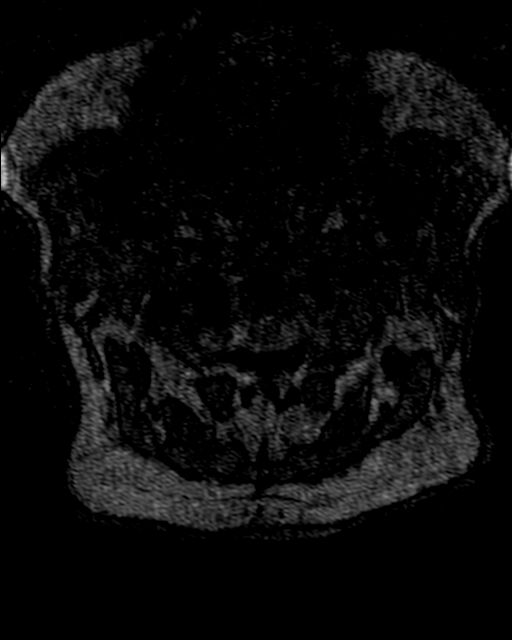
[im 3/141]
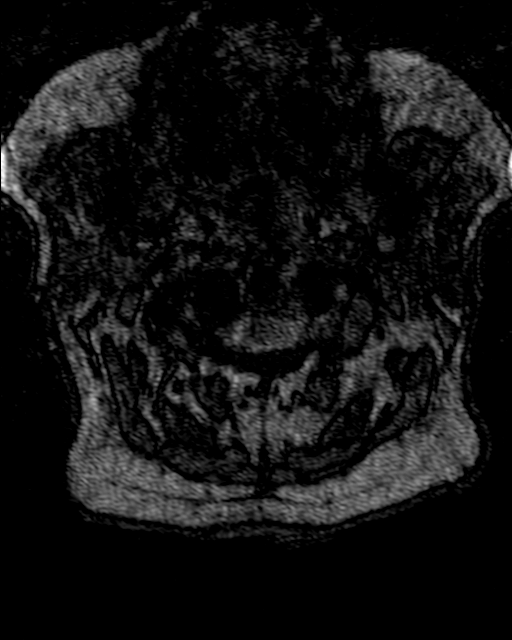
[im 6/141]
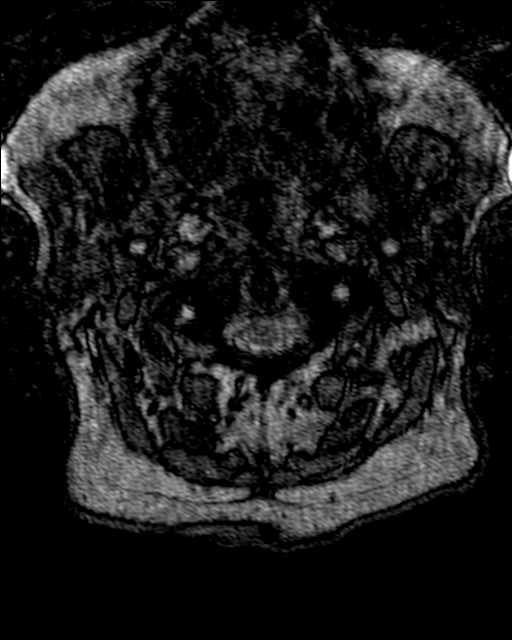
[im 9/141]
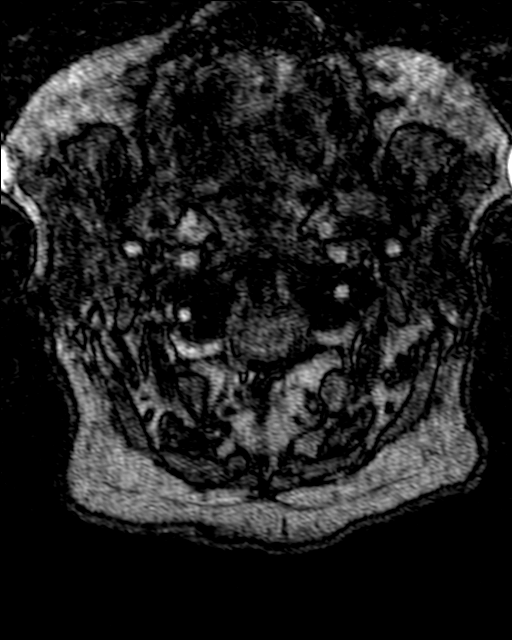
[im 12/141]
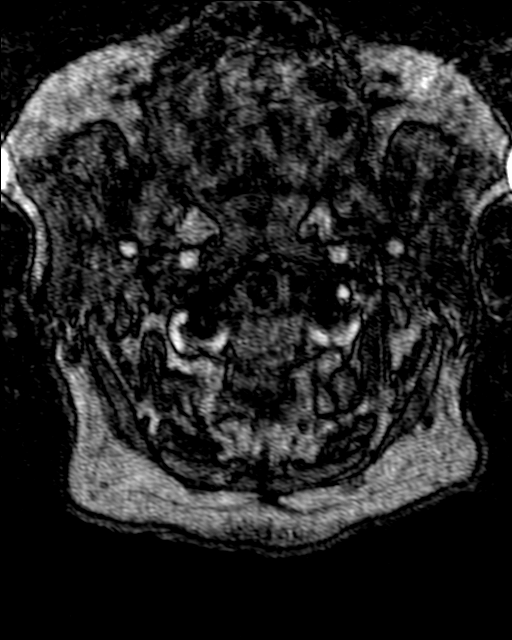
[im 15/141]
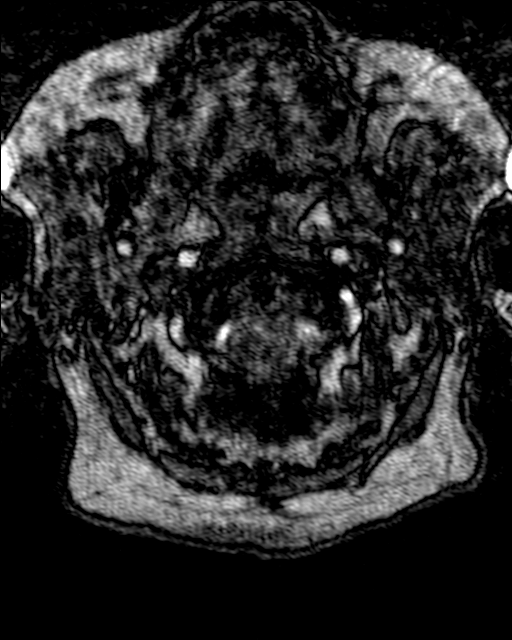
[im 24/141]
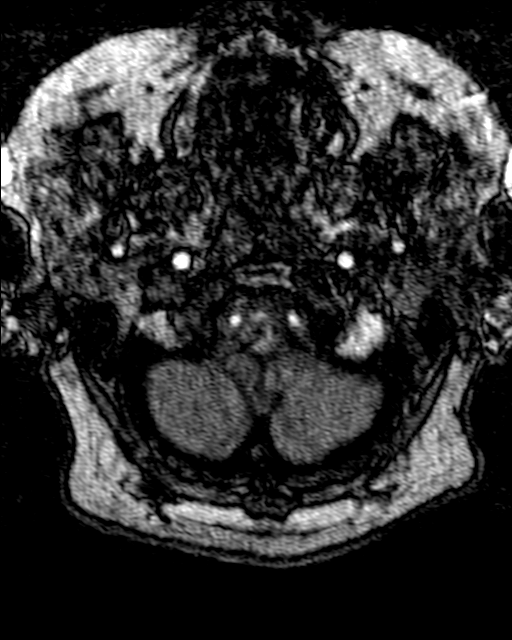
[im 27/141]
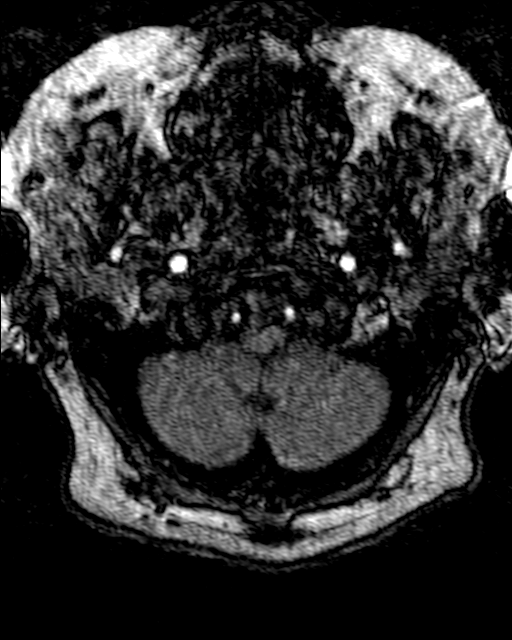
[im 45/141]
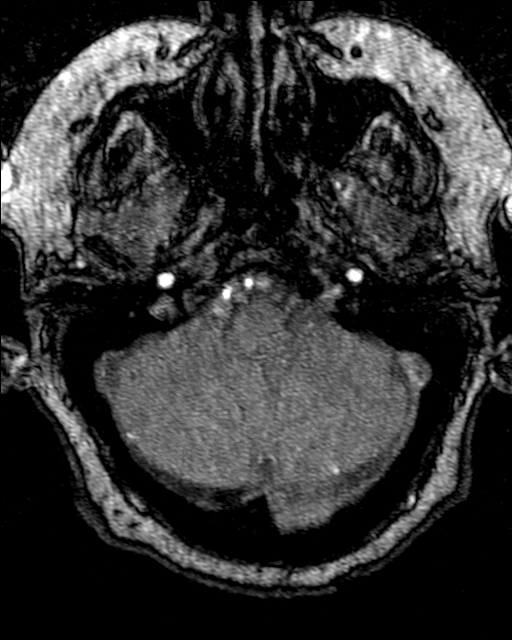
[im 63/141]
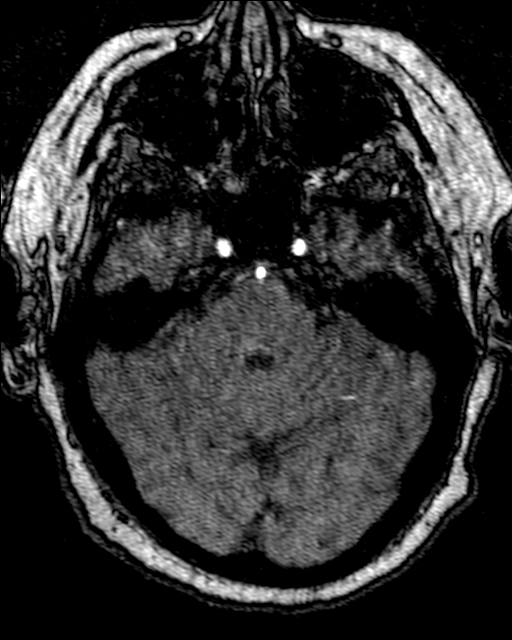
[im 72/141]
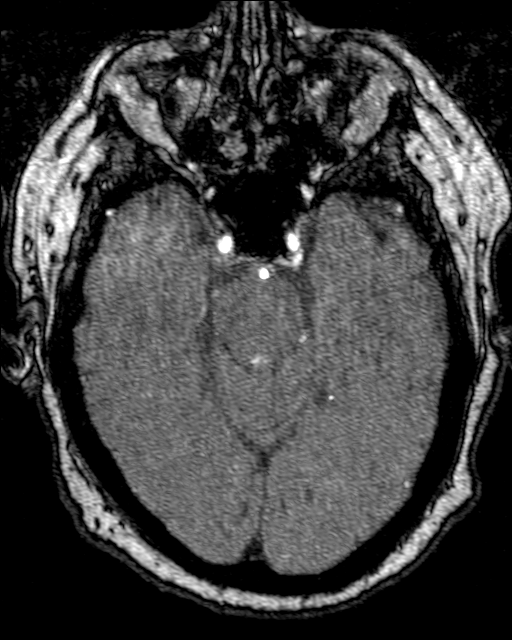
[im 81/141]
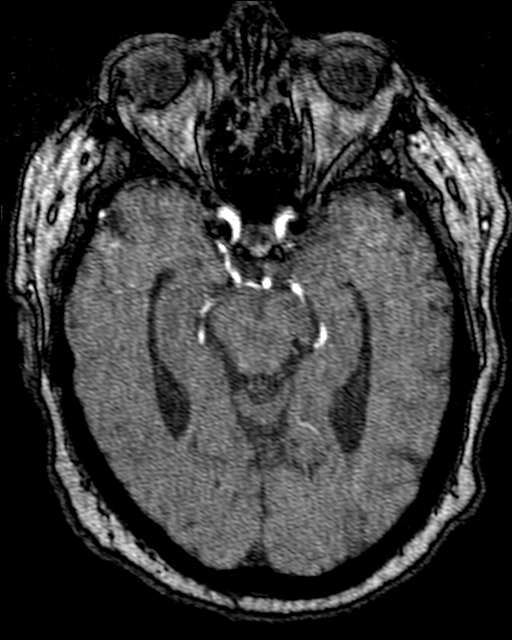
[im 99/141]
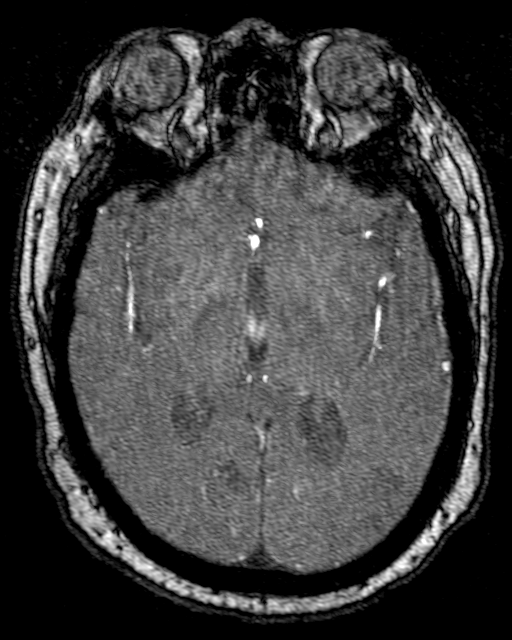
[im 117/141]
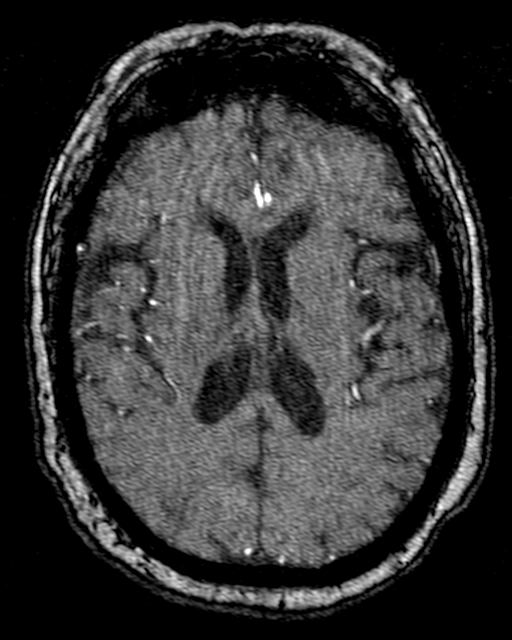
[im 120/141]
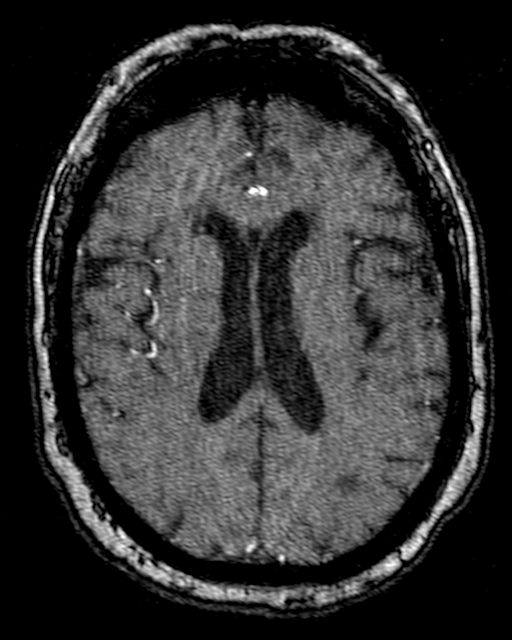
[im 135/141]
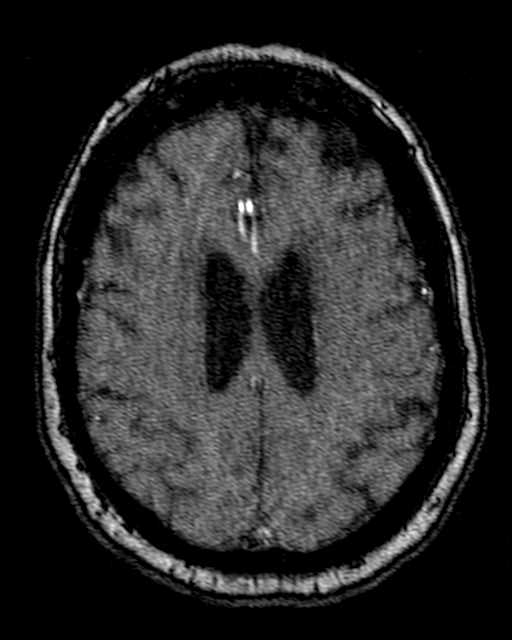

[16 of 48 positions shown; findings below may reference images not displayed]

FINDINGS: Image quality degraded by mild motion.

Both vertebral arteries are patent to the basilar.  The basilar is
widely patent.  Fetal origin of the right posterior cerebral artery
with hypoplastic right P1 segment.  Both posterior cerebral
arteries are patent.  Cerebellar artery is not optimally visualized
due to motion.

Internal carotid artery is patent bilaterally.  Mild to moderate
stenosis in the cavernous carotid bilaterally due to
atherosclerotic irregularity.

Severe stenosis proximal right A1 segment.  Right anterior cerebral
artery is patent.  Atherosclerotic disease in the right M1 segment
with mild stenosis.  Moderate to severe stenosis involving the
right middle cerebral artery bifurcation.

The left anterior cerebral artery is patent without stenosis.  Mild
atherosclerotic disease in the left middle cerebral artery
branches.

Negative for cerebral aneurysm.
IMPRESSION: Intracranial atherosclerotic disease involving the cavernous
carotid, and right anterior and middle cerebral arteries.  No large
vessel occlusion.

## 2014-03-04 ENCOUNTER — Ambulatory Visit (INDEPENDENT_AMBULATORY_CARE_PROVIDER_SITE_OTHER): Payer: Medicare Other | Admitting: Urology

## 2014-03-04 DIAGNOSIS — N3941 Urge incontinence: Secondary | ICD-10-CM

## 2014-03-04 DIAGNOSIS — N952 Postmenopausal atrophic vaginitis: Secondary | ICD-10-CM

## 2014-03-04 DIAGNOSIS — N3020 Other chronic cystitis without hematuria: Secondary | ICD-10-CM

## 2014-04-29 ENCOUNTER — Ambulatory Visit (INDEPENDENT_AMBULATORY_CARE_PROVIDER_SITE_OTHER): Payer: Medicare Other | Admitting: Urology

## 2014-04-29 DIAGNOSIS — R300 Dysuria: Secondary | ICD-10-CM

## 2014-04-29 DIAGNOSIS — N3941 Urge incontinence: Secondary | ICD-10-CM

## 2014-04-29 DIAGNOSIS — N952 Postmenopausal atrophic vaginitis: Secondary | ICD-10-CM

## 2014-04-29 DIAGNOSIS — N3020 Other chronic cystitis without hematuria: Secondary | ICD-10-CM

## 2014-05-12 ENCOUNTER — Encounter (HOSPITAL_COMMUNITY): Payer: Self-pay | Admitting: Emergency Medicine

## 2014-05-12 ENCOUNTER — Emergency Department (HOSPITAL_COMMUNITY)
Admission: EM | Admit: 2014-05-12 | Discharge: 2014-05-12 | Disposition: A | Discharge status: Home / Self Care | Payer: Medicare Other | Attending: Emergency Medicine | Admitting: Emergency Medicine

## 2014-05-12 ENCOUNTER — Emergency Department (HOSPITAL_COMMUNITY): Payer: Medicare Other

## 2014-05-12 DIAGNOSIS — E119 Type 2 diabetes mellitus without complications: Secondary | ICD-10-CM | POA: Diagnosis not present

## 2014-05-12 DIAGNOSIS — Z951 Presence of aortocoronary bypass graft: Secondary | ICD-10-CM | POA: Insufficient documentation

## 2014-05-12 DIAGNOSIS — I10 Essential (primary) hypertension: Secondary | ICD-10-CM | POA: Insufficient documentation

## 2014-05-12 DIAGNOSIS — Z90710 Acquired absence of both cervix and uterus: Secondary | ICD-10-CM | POA: Diagnosis not present

## 2014-05-12 DIAGNOSIS — M545 Low back pain, unspecified: Secondary | ICD-10-CM | POA: Diagnosis not present

## 2014-05-12 DIAGNOSIS — Z8739 Personal history of other diseases of the musculoskeletal system and connective tissue: Secondary | ICD-10-CM | POA: Insufficient documentation

## 2014-05-12 DIAGNOSIS — Z87448 Personal history of other diseases of urinary system: Secondary | ICD-10-CM | POA: Insufficient documentation

## 2014-05-12 DIAGNOSIS — I2510 Atherosclerotic heart disease of native coronary artery without angina pectoris: Secondary | ICD-10-CM | POA: Insufficient documentation

## 2014-05-12 DIAGNOSIS — Z8669 Personal history of other diseases of the nervous system and sense organs: Secondary | ICD-10-CM | POA: Insufficient documentation

## 2014-05-12 DIAGNOSIS — E0590 Thyrotoxicosis, unspecified without thyrotoxic crisis or storm: Secondary | ICD-10-CM | POA: Diagnosis not present

## 2014-05-12 DIAGNOSIS — R109 Unspecified abdominal pain: Secondary | ICD-10-CM | POA: Insufficient documentation

## 2014-05-12 DIAGNOSIS — Z794 Long term (current) use of insulin: Secondary | ICD-10-CM | POA: Diagnosis not present

## 2014-05-12 DIAGNOSIS — G8929 Other chronic pain: Secondary | ICD-10-CM | POA: Diagnosis not present

## 2014-05-12 DIAGNOSIS — M549 Dorsalgia, unspecified: Secondary | ICD-10-CM

## 2014-05-12 DIAGNOSIS — Z79899 Other long term (current) drug therapy: Secondary | ICD-10-CM | POA: Diagnosis not present

## 2014-05-12 DIAGNOSIS — Z86711 Personal history of pulmonary embolism: Secondary | ICD-10-CM | POA: Insufficient documentation

## 2014-05-12 DIAGNOSIS — N390 Urinary tract infection, site not specified: Secondary | ICD-10-CM | POA: Diagnosis not present

## 2014-05-12 DIAGNOSIS — Z9089 Acquired absence of other organs: Secondary | ICD-10-CM | POA: Insufficient documentation

## 2014-05-12 HISTORY — DX: Other intervertebral disc degeneration, lumbar region without mention of lumbar back pain or lower extremity pain: M51.369

## 2014-05-12 HISTORY — DX: Other intervertebral disc degeneration, lumbar region: M51.36

## 2014-05-12 LAB — URINALYSIS, ROUTINE W REFLEX MICROSCOPIC
Bilirubin Urine: NEGATIVE
Glucose, UA: NEGATIVE mg/dL
HGB URINE DIPSTICK: NEGATIVE
Ketones, ur: NEGATIVE mg/dL
Nitrite: NEGATIVE
PH: 5.5 (ref 5.0–8.0)
Protein, ur: NEGATIVE mg/dL
SPECIFIC GRAVITY, URINE: 1.025 (ref 1.005–1.030)
UROBILINOGEN UA: 0.2 mg/dL (ref 0.0–1.0)

## 2014-05-12 LAB — URINE MICROSCOPIC-ADD ON

## 2014-05-12 MED ORDER — CEPHALEXIN 500 MG PO CAPS: 500.0000 mg | ORAL_CAPSULE | Freq: Four times a day (QID) | ORAL | Status: DC

## 2014-05-12 MED ORDER — OXYCODONE-ACETAMINOPHEN 5-325 MG PO TABS: 1.0000 | ORAL_TABLET | Freq: Once | ORAL | Status: DC

## 2014-05-12 MED ORDER — HYDROCODONE-ACETAMINOPHEN 5-325 MG PO TABS: ORAL_TABLET | ORAL | Status: DC

## 2014-05-12 MED ORDER — MORPHINE SULFATE 4 MG/ML IJ SOLN
4.0000 mg | Freq: Once | INTRAMUSCULAR | Status: AC
  Administered 2014-05-12: 4 mg via INTRAMUSCULAR
  Filled 2014-05-12: qty 1

## 2014-05-12 NOTE — Discharge Instructions (Signed)
°Emergency Department Resource Guide °1) Find a Doctor and Pay Out of Pocket °Although you won't have to find out who is covered by your insurance plan, it is a good idea to ask around and get recommendations. You will then need to call the office and see if the doctor you have chosen will accept you as a new patient and what types of options they offer for patients who are self-pay. Some doctors offer discounts or will set up payment plans for their patients who do not have insurance, but you will need to ask so you aren't surprised when you get to your appointment. ° °2) Contact Your Local Health Department °Not all health departments have doctors that can see patients for sick visits, but many do, so it is worth a call to see if yours does. If you don't know where your local health department is, you can check in your phone book. The CDC also has a tool to help you locate your state's health department, and many state websites also have listings of all of their local health departments. ° °3) Find a Walk-in Clinic °If your illness is not likely to be very severe or complicated, you may want to try a walk in clinic. These are popping up all over the country in pharmacies, drugstores, and shopping centers. They're usually staffed by nurse practitioners or physician assistants that have been trained to treat common illnesses and complaints. They're usually fairly quick and inexpensive. However, if you have serious medical issues or chronic medical problems, these are probably not your best option. ° °No Primary Care Doctor: °- Call [facility] at  832-8000 - they can help you locate a primary care doctor that  accepts your insurance, provides certain services, etc. °- Physician Referral Service- 1-800-533-3463 ° °Chronic Pain Problems: °Organization         Address  Phone   Notes  °[facility]  (336) 297-2271 Patients need to be referred by their primary care doctor.  ° °Medication  Assistance: °Organization         Address  Phone   Notes  °[place] [facility] [address] °[place], [place] [address] (336) 641-8030 --Must be a resident of [place] °-- Must have NO insurance coverage whatsoever (no Medicaid/ Medicare, etc.) °-- The pt. MUST have a primary care doctor that directs their care regularly and follows them in the community °  °[facility]  (866) 331-1348   °[employer]  (888) 892-1162   ° °Agencies that provide inexpensive medical care: °Organization         Address  Phone   Notes  °[facility]  (336) 832-8035   ° Internal Medicine    (336) 832-7272   °[facility] [address] °[place], [place] [address] (336) 832-4777   °[facility] [address], °[place] (336) 271-4999   °[facility]    (336) 373-0678   °[facility]    (336) 272-1050   °[facility] ° [address], [place] Phone:  (336) 832-4444, Fax:  (336) 832-4440 Hours of Operation:  9 am - 6 pm, M-F.  Also accepts Medicaid/Medicare and self-pay.  °[facility] ° [address],  Phone: (336) 832-3150, Fax: (336) 832-3151. Hours of Operation:  8:30 am - 5:30 pm, M-F.  Also accepts Medicaid and self-pay.  °[facility] [place] [address], [place] Phone: (336) 878-6027   °[facility] [address], [place], [place] (336)723-1848, Ext. 123 Mondays & Thursdays: 7-9 AM.  First 15 patients are seen on a first come, first serve basis. °  ° °Medicaid-accepting [place] Providers: ° °Organization         Address  Phone   Notes  °[facility] [address], [place] (336) 641-2100 Also accepts self-pay patients.  °[facility] [address], [place] ° (336) 856-9996   °[facility] [address], [place]  (336) 288-8857   °[facility] [address] [place] [address], [place] (336) 299-7000   °Veita Bland [address], [place]  ° (336) 373-1557 Only accepts [place] Access Medicaid patients after they have their name applied to their card.  ° °Self-Pay (no insurance) in [place]: ° °Organization         Address  Phone   Notes  °Sickle Cell Patients, [facility] [address], [place] (336) 832-1970   °[facility] [address], [place] (336) 832-4400   °[facility] ° [address], [place] (336) 992-4800   °[facility]/Dr. Osei-Bonsu ° [address] [place] [address], [place] or [address], [place] (336) [phone] Phone number for both [place] and [place] locations is the same.  °[facility] [address], [place] (336) 299-0000   °[facility] [address] [place] [address], [place] or [address] (336) 852-7530 °(336) 878-2260   °[facility] [address], [place] (336) 350-1642, phone; (336) 294-5005, fax Sees patients 1st and 3rd Saturday of every month.  Must not qualify for public or private insurance (i.e. Medicaid, Medicare, [place] Health Choice, Veterans' Benefits) • Household income should be no more than 200% of the poverty level •The clinic cannot treat you if you are pregnant or think you are pregnant • Sexually transmitted diseases are not treated at the clinic.  ° ° °Dental Care: °Organization         Address  Phone  Notes  °[place] [facility] [address], [place] (336) 641-6152 Accepts children up to age 21 who are enrolled in Medicaid or [place] Health Choice; pregnant women with a Medicaid card; and children who have applied for Medicaid or [place] Health Choice, but were declined, whose parents can pay a reduced fee at time of service.  °[place]  [facility] [place]  [address], [place] (336) 641-7733 Accepts children up to age 21 who are enrolled in Medicaid or [place] Health Choice; pregnant women with a Medicaid card; and children who have applied for Medicaid or [place] Health Choice, but were declined, whose parents can pay a reduced fee at time of service.  °[facility] ° [address],  (336) 641-4533 Patients are seen by appointment only. Walk-ins are not accepted. [facility] will see patients 18 years of age and older. °Monday - Tuesday (8am-5pm) °Most Wednesdays (8:30-5pm) °$30 per visit, cash only  °[facility] ° [address], [place] (336) 641-4533 Patients are seen by appointment only. Walk-ins are not accepted. [facility] will see patients 18 years of age and older. °One   Wednesday Evening (Monthly: Volunteer Based).  $30 per visit, cash only  °[facility]  (919) 537-3737 for adults; Children under age 4, call Graduate Pediatric Dentistry at ([phone]) [phone]. Children aged 4-14, please call (919) [phone] to request a pediatric application. ° Dental services are provided in all areas of dental care including fillings, crowns and bridges, complete and partial dentures, implants, gum treatment, root canals, and extractions. Preventive care is also provided. Treatment is provided to both adults and children. °Patients are selected via a lottery and there is often a waiting list. °  °[facility] [address], °[place] ° (336) 763-8833 [email or web address] °  °[facility] [address], [place], [place] (336)723-1848, Ext. 123 Second and Fourth Thursday of each month, opens at 6:30 AM; Clinic ends at 9 AM.  Patients are seen on a first-come first-served basis, and a limited number are seen during each clinic.  ° °[facility] ° [address], [place], [place] (336) 723-7904    Eligibility Requirements °You must have lived in [place], or [place] counties for at least the last three months. °  You cannot be eligible for state or federal sponsored healthcare insurance, including Veterans Administration, Medicaid, or Medicare. °  You generally cannot be eligible for healthcare insurance through your employer.  °  How to apply: °Eligibility screenings are held every Tuesday and Wednesday afternoon from 1:00 pm until 4:00 pm. You do not need an appointment for the interview!  °[facility] [address], [place], [place] 336-631-2330   °[facility]  336-342-8273   °[place]  336-703-3100   ° [facility]  336-570-6415   ° °Behavioral Health Resources in the Community: °Intensive Outpatient Programs °Organization         Address  Phone  Notes  °[facility] [address], [place], [place] 336-878-6098   °[facility] [address], [place], [place] 336-832-9800   °[facility] [address], [place], [place] ° 336-882-2125   °[facility] [address],  °[place], [place] 1-800-853-5163 or 336-641-4981   °Substance Abuse Resources °Organization         Address  Phone  Notes  °[facility]  336-882-2125   °[facility]  336-784-9470   °[facility]  336-285-9073   °[facility]  336-845-3988   °Residential & Outpatient Substance Abuse Program  1-800-659-3381   °Psychological Services °Organization         Address  Phone  Notes  °[facility]  336- 832-9600   °[facility]  336- 378-7881   °[facility] [address], [place] 1-800-853-5163 or 336-641-4981   ° °Mobile Crisis Teams °Organization         Address  Phone  Notes  °[facility], Mobile Crisis Care Unit  1-877-626-1772   °Assertive °[facility] ° [address]  [place], [place] 336-834-9664   °Sharon DeEsch [address] °[place] 336-554-5454   ° °Self-Help/Support Groups °Organization         Address  Phone             Notes  °[facility]  - variety of support groups  336- 373-1402 Call for more information  °Narcotics Anonymous (NA), [facility] [address], °[place]  2 meetings at this location  ° °  Residential Treatment Programs °Organization         Address  Phone  Notes  °[facility] [address],    °[place]  1-866-801-8205   °[facility] ° [address], [place], [place] 704-293-8524   °[facility] [address], [place] 336-845-3988 Admissions: 8am-3pm M-F  °[facility] [address]    °[place], [place] 336-841-1104   °[facility] [address], [place], [place] 336-379-7146   °[facility] [address]  °[place], [place] 336-285-9073   °[facility] Intensive Outpatient [address], [place], [place] 336-852-3033   °[facility] [address]  °[place], [place] 1-877-615-2722 or 336-784-9470   °[facility] [address] [place], [place] 336-227-7417 Accepts Medicaid  °[facility] [address]  ° [place] 1-800-659-3381 Substance Abuse/Addiction Treatment  ° °[place] Behavioral Health Resources °Organization         Address  Phone  Notes  °[facility]  (888) 581-9988   °Julie Brannon, PhD [address] [place], [place]   (336) 349-5553 or (336) 951-0000   °[facility]   [address] °[place], [place] (336) 349-4454   °[facility] [address], [place], [place] (336) 342-8316 Insurance/Medicaid/sponsorship through [facility]  °[facility] [address]                                    [place], [place] (336) 342-8316 Therapy/tele-psych/case    °[facility] [address]  ° [place], [place] (336) 349-2233    °Dr. Arfeen  (336) 349-4544   °[facility] [place]  [employer] [place] [facility] 1) [address], [place] °2) [address], [place] °3)  [address], [place] (336) 349-3220 °(336) 342-7768 ° °(336) 342-8140   °[place] [facility] (336) 342-1394 or (336) 342-3537 (After Hours)    ° °Take the prescriptions as directed.  Apply moist heat or ice to the area(s) of discomfort, for 15 minutes at a time, several times per day for the next few days.  Do not fall asleep on a heating or ice pack.  Call your regular medical doctor tomorrow to schedule a follow up appointment in the next 2 days.  Return to the Emergency Department immediately if worsening. ° °

## 2014-05-12 NOTE — ED Notes (Signed)
Pain lt side low back and low abd for 3 days, Nausea, no vomiting,  Hx of back problem , .

## 2014-05-12 NOTE — ED Provider Notes (Signed)
CSN: [id number]     Arrival date & time 05/12/14  1722 History   First MD Initiated Contact with Patient 05/12/14 1954     Chief Complaint  Patient presents with  . Back Pain      HPI Pt was seen at 2010. Per pt, c/o gradual onset and persistence of constant acute flair of her chronic left sided low back "pain" radiating into her left hip for the past 3 days. Denies any change in her usual chronic pain pattern.  Pain worsens with palpation of the area and body position changes. States she has been taking tramadol as rx by her PMD without relief. States she "needs something stronger."  Denies incont/retention of bowel or bladder, no saddle anesthesia, no focal motor weakness, no tingling/numbness in extremities, no injury. The patient has a significant history of similar symptoms previously, recently being evaluated for this complaint and multiple prior evals for same. Pt also c/o gradual onset and persistence of intermittent episodes of dysuria and urinary frequency for the past several months. Pt has been dx previously with UTI, rx course of abx with improves her symptoms. Pt states her symptoms have returned over the past week. Denies hematuria, no N/V/D, no abd pain, no fevers, no CP/SOB.      Past Medical History  Diagnosis Date  . Arteriosclerotic cardiovascular disease (ASCVD)      CABG surgery in 2001; nl stress nuclear-2007; angiography in 01/2006- TO of LAD; 70% ostial stenosis      of a small ramus; patent grafts; normal EF; cath in 12/2009 essentially unchanged; EF-66%;  Congestive heart failure with preserved LV systolic function  . Hypertension   . Obstructive sleep apnea   . Cerebrovascular disease     H/o CVA  . Chronic back pain   . Vestibular neuritis     ataxia  . Varicose veins   . Diabetes mellitus     Requires insulin  . Obesity   . Chronic obstructive pulmonary disease     Chronic bronchitis  . Fall 2009    right humeral fracture  . Peripheral neuropathy      Left lower extremity; possibly radiculopathy  . Hyperthyroidism   . Acute renal failure     Possibly contrast-induced  . DDD (degenerative disc disease), lumbar   . Normal cardiac stress test 2012  . Chronic left hip pain    Past Surgical History  Procedure Laterality Date  . Coronary artery bypass graft  6/01    SVG to diagonal, LIMA to LAD  . Rotator cuff repair    . Appendectomy    . Cholecystectomy    . Total abdominal hysterectomy    . Lumbar spine surgery      X2  . Knee surgery      Arthroscopic  . Carpal tunnel release      Bilateral  . Orif humerus fracture  2009   Family History  Problem Relation Age of Onset  . Coronary artery disease      Multiple first and second-degree relatives  . Aneurysm      Cerebral circulation  . Hypertension Mother   . Colon cancer Sister   . Clotting disorder      Children diagnosed with hypercoagulable state   History  Substance Use Topics  . Smoking status: Never Smoker   . Smokeless tobacco: Never Used  . Alcohol Use: No    Review of Systems ROS: Statement: All systems negative except as marked or noted in the  HPI; Constitutional: Negative for fever and chills. ; ; Eyes: Negative for eye pain, redness and discharge. ; ; ENMT: Negative for ear pain, hoarseness, nasal congestion, sinus pressure and sore throat. ; ; Cardiovascular: Negative for chest pain, palpitations, diaphoresis, dyspnea and peripheral edema. ; ; Respiratory: Negative for cough, wheezing and stridor. ; ; Gastrointestinal: Negative for nausea, vomiting, diarrhea, abdominal pain, blood in stool, hematemesis, jaundice and rectal bleeding. ; ; Genitourinary: +urinary frequency, dysuria. Negative for flank pain and hematuria. ; ; Musculoskeletal: +LBP. Negative for neck pain. Negative for swelling and trauma.; ; Skin: Negative for pruritus, rash, abrasions, blisters, bruising and skin lesion.; ; Neuro: Negative for headache, lightheadedness and neck stiffness. Negative  for weakness, altered level of consciousness , altered mental status, extremity weakness, paresthesias, involuntary movement, seizure and syncope.      Allergies  Codeine  Home Medications   Prior to Admission medications   Medication Sig Start Date End Date Taking? Authorizing Provider  insulin detemir (LEVEMIR) 100 UNIT/ML injection Inject 25-70 Units into the skin 2 (two) times daily. 25 units in the morning and 70 units at bedtime   Yes Historical Provider, MD  insulin lispro (HUMALOG) 100 UNIT/ML injection Inject 20-25 Units into the skin 3 (three) times daily.    Yes Historical Provider, MD  levothyroxine (SYNTHROID, LEVOTHROID) 25 MCG tablet Take 25 mcg by mouth every morning.    Yes Historical Provider, MD  lisinopril (PRINIVIL,ZESTRIL) 10 MG tablet Take 1 tablet by mouth daily. 11/09/13  Yes Historical Provider, MD  LORazepam (ATIVAN) 0.5 MG tablet Take 0.5 mg by mouth every 4 (four) hours as needed. nerves   Yes Historical Provider, MD  metFORMIN (GLUCOPHAGE) 500 MG tablet Take 500 mg by mouth 2 (two) times daily with a meal.     Yes Historical Provider, MD  rosuvastatin (CRESTOR) 10 MG tablet Take 10 mg by mouth at bedtime.    Yes Historical Provider, MD  zolpidem (AMBIEN) 5 MG tablet Take 5 mg by mouth at bedtime as needed. For sleep 04/14/14  Yes Historical Provider, MD  nitroGLYCERIN (NITROSTAT) 0.4 MG SL tablet Place 0.4 mg under the tongue every 5 (five) minutes as needed for chest pain.     Historical Provider, MD   BP 155/72  Pulse 73  Temp(Src) 98.9 F (37.2 C) (Oral)  Resp 18  SpO2 99% Physical Exam 2015: Physical examination:  Nursing notes reviewed; Vital signs and O2 SAT reviewed;  Constitutional: Well developed, Well nourished, Well hydrated, Uncomfortable appearing.; Head:  Normocephalic, atraumatic; Eyes: EOMI, PERRL, No scleral icterus; ENMT: Mouth and pharynx normal, Mucous membranes moist; Neck: Supple, Full range of motion, No lymphadenopathy; Cardiovascular:  Regular rate and rhythm, No gallop; Respiratory: Breath sounds clear & equal bilaterally, No rales, rhonchi, wheezes.  Speaking full sentences with ease, Normal respiratory effort/excursion; Chest: Nontender, Movement normal; Abdomen: Soft, Nontender, Nondistended, Normal bowel sounds; Genitourinary: No CVA tenderness; Spine:  No midline CS, TS, LS tenderness. +TTP left lumbar paraspinal muscles.;; Extremities: Pulses normal, No tenderness, No edema, No calf edema or asymmetry.; Neuro: AA&Ox3, Major CN grossly intact.  Speech clear. Strength 5/5 equal bilat UE's and LE's, including great toe dorsiflexion.  DTR 2/4 equal bilat UE's and LE's.  No gross sensory deficits.  Neg straight leg raises bilat. Climbs on and off stretcher easily by herself. Gait steady. No gross focal motor or sensory deficits in extremities.; Skin: Color normal, Warm, Dry.   ED Course  Procedures     EKG Interpretation None  MDM  MDM Reviewed: previous chart, vitals and nursing note Reviewed previous: labs and ECG Interpretation: labs, ECG, x-ray and CT scan    Results for orders placed during the hospital encounter of 05/12/14  URINALYSIS, ROUTINE W REFLEX MICROSCOPIC      Result Value Ref Range   Color, Urine YELLOW  YELLOW   APPearance CLEAR  CLEAR   Specific Gravity, Urine 1.025  1.005 - 1.030   pH 5.5  5.0 - 8.0   Glucose, UA NEGATIVE  NEGATIVE mg/dL   Hgb urine dipstick NEGATIVE  NEGATIVE   Bilirubin Urine NEGATIVE  NEGATIVE   Ketones, ur NEGATIVE  NEGATIVE mg/dL   Protein, ur NEGATIVE  NEGATIVE mg/dL   Urobilinogen, UA 0.2  0.0 - 1.0 mg/dL   Nitrite NEGATIVE  NEGATIVE   Leukocytes, UA TRACE (*) NEGATIVE  URINE MICROSCOPIC-ADD ON      Result Value Ref Range   Squamous Epithelial / LPF RARE  RARE   WBC, UA 11-20  <3 WBC/hpf   Bacteria, UA RARE  RARE   Ct Abdomen Pelvis Wo Contrast 05/12/2014   CLINICAL DATA:  Left abdomen and hip pain.  EXAM: CT ABDOMEN AND PELVIS WITHOUT CONTRAST  TECHNIQUE:  Multidetector CT imaging of the abdomen and pelvis was performed following the standard protocol without IV contrast.  COMPARISON:  08/31/2010 and abdomen ultrasound dated 12/10/2013.  FINDINGS: Cholecystectomy clips. Unremarkable non contrasted appearance of the liver, spleen, pancreas, adrenal glands, kidneys and urinary bladder. Surgically absent uterus and ovaries. No gastrointestinal abnormalities or enlarged lymph nodes. No evidence of appendicitis. Inferior right ovarian being dilatation and calcifications. Right middle lobe calcified granuloma. Lumbar and lower thoracic spine degenerative changes. Normal appearing hips.  IMPRESSION: No acute abnormality.   Electronically Signed   By: Gordan Payment M.D.   On: 05/12/2014 21:40    2145:  Pt feels better after meds and wants to go home now. +UTI, UC pending; will tx with keflex. Will tx msk pain symptomatically at this time. Dx and testing d/w pt and family.  Questions answered.  Verb understanding, agreeable to d/c home with outpt f/u.   Laray Anger, DO 05/15/14 1301

## 2014-05-14 LAB — URINE CULTURE: Colony Count: 45000

## 2014-06-16 IMAGING — CR DG HIP (WITH OR WITHOUT PELVIS) 2-3V*L*
3 series · 3 of 3 positions shown · non-contrast
Comparison: None

CLINICAL DATA: Left hip pain and tenderness for 1 month, no known
injury

LEFT HIP - COMPLETE 2+ VIEW

[view not recorded (1 of 3)]
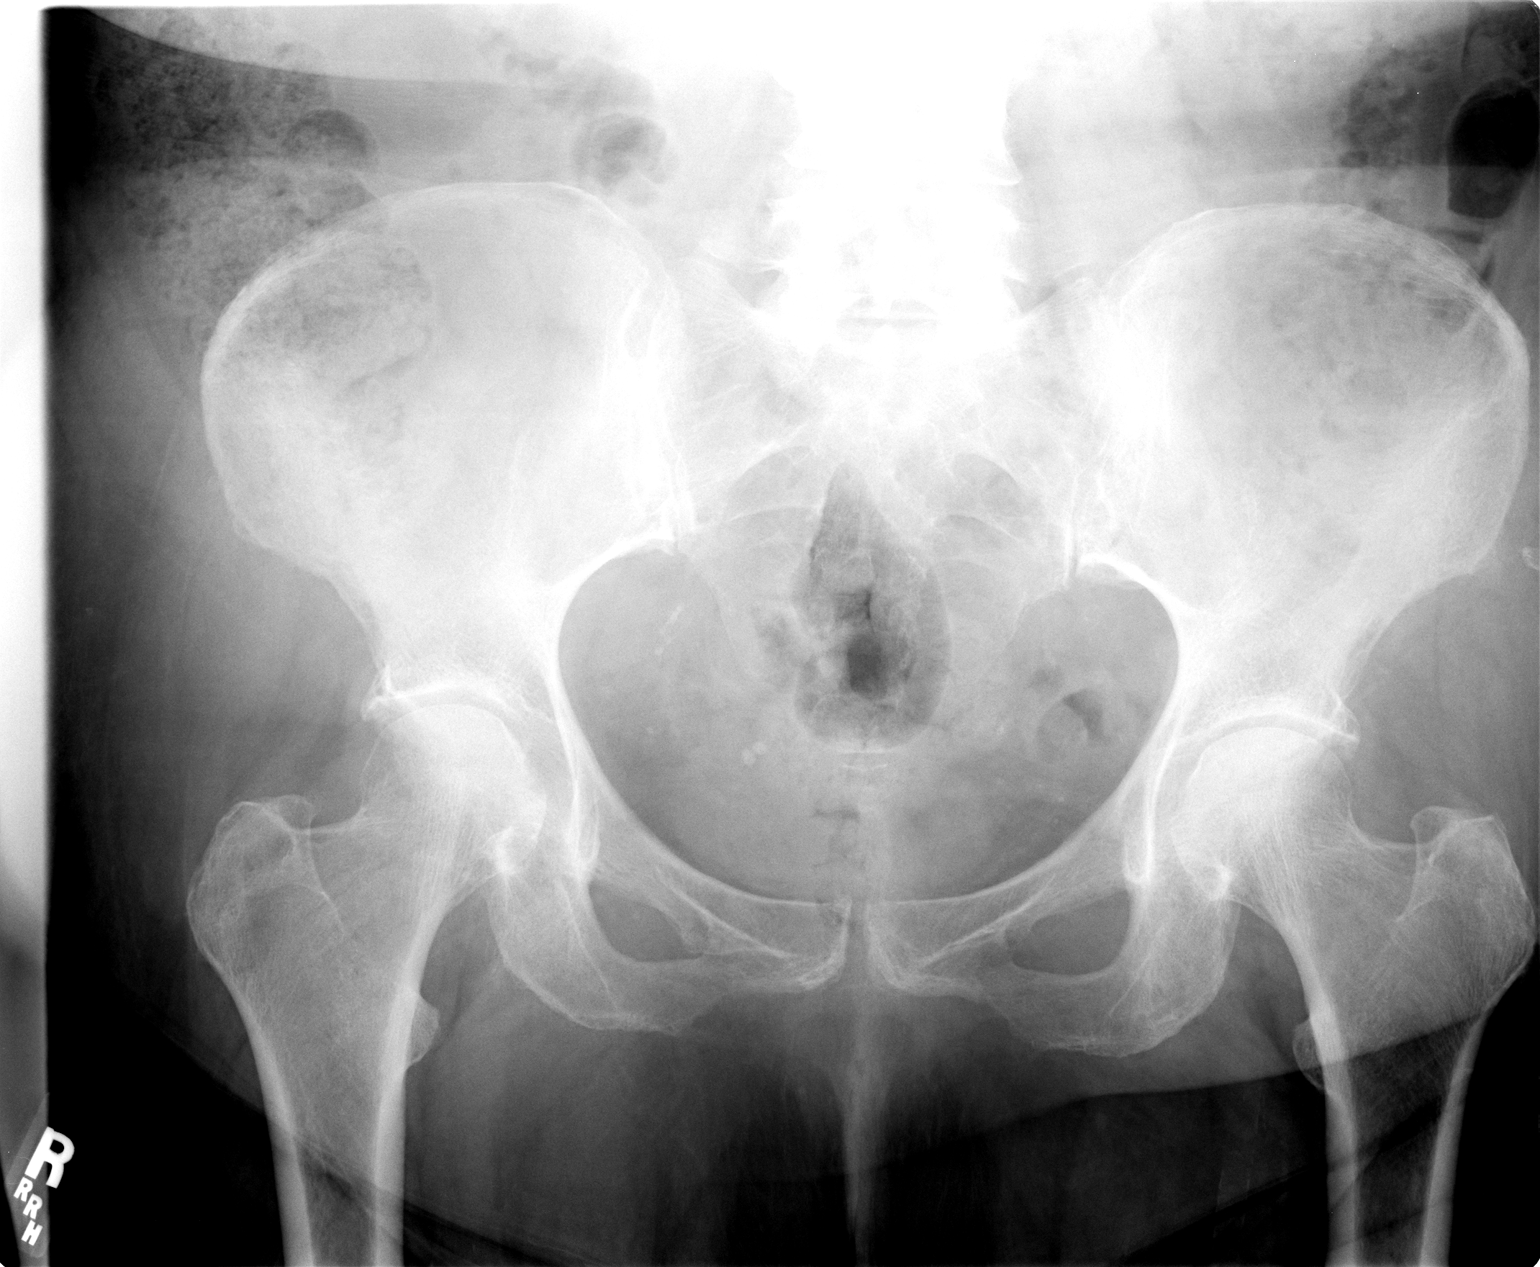

[view not recorded (2 of 3)]
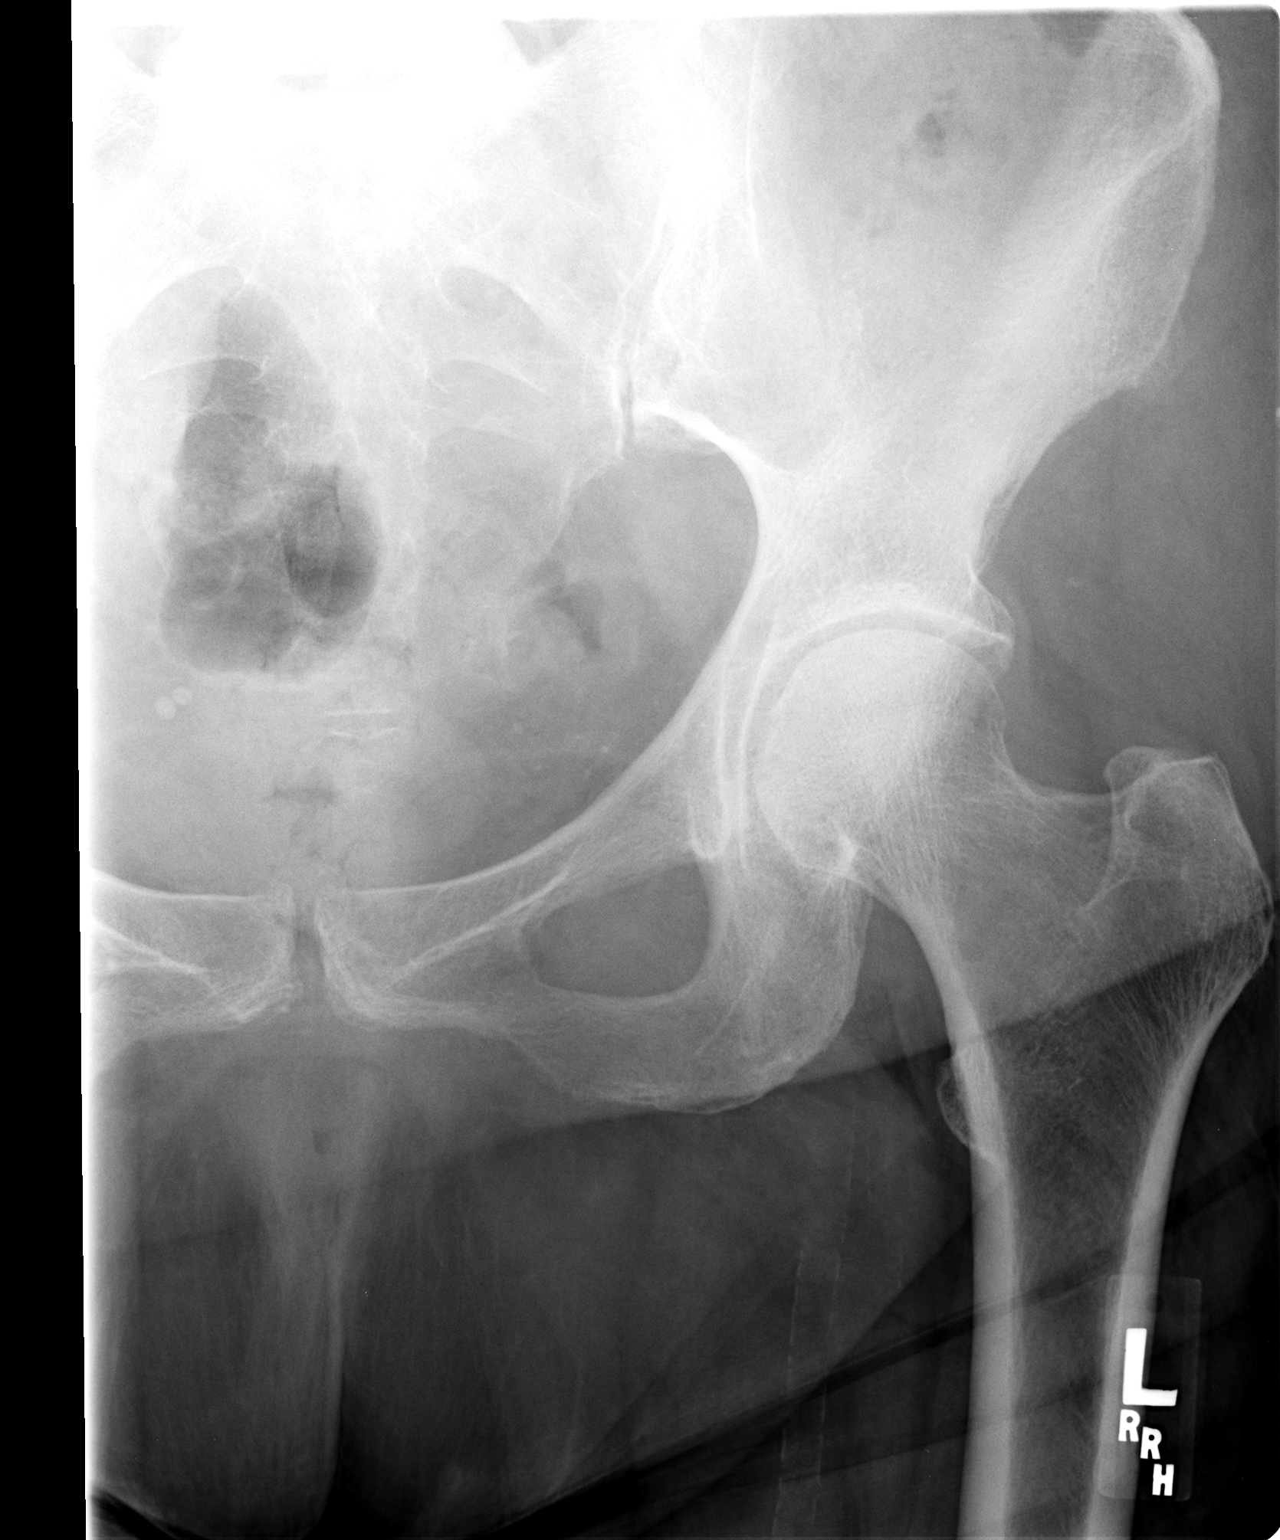

[view not recorded (3 of 3)]
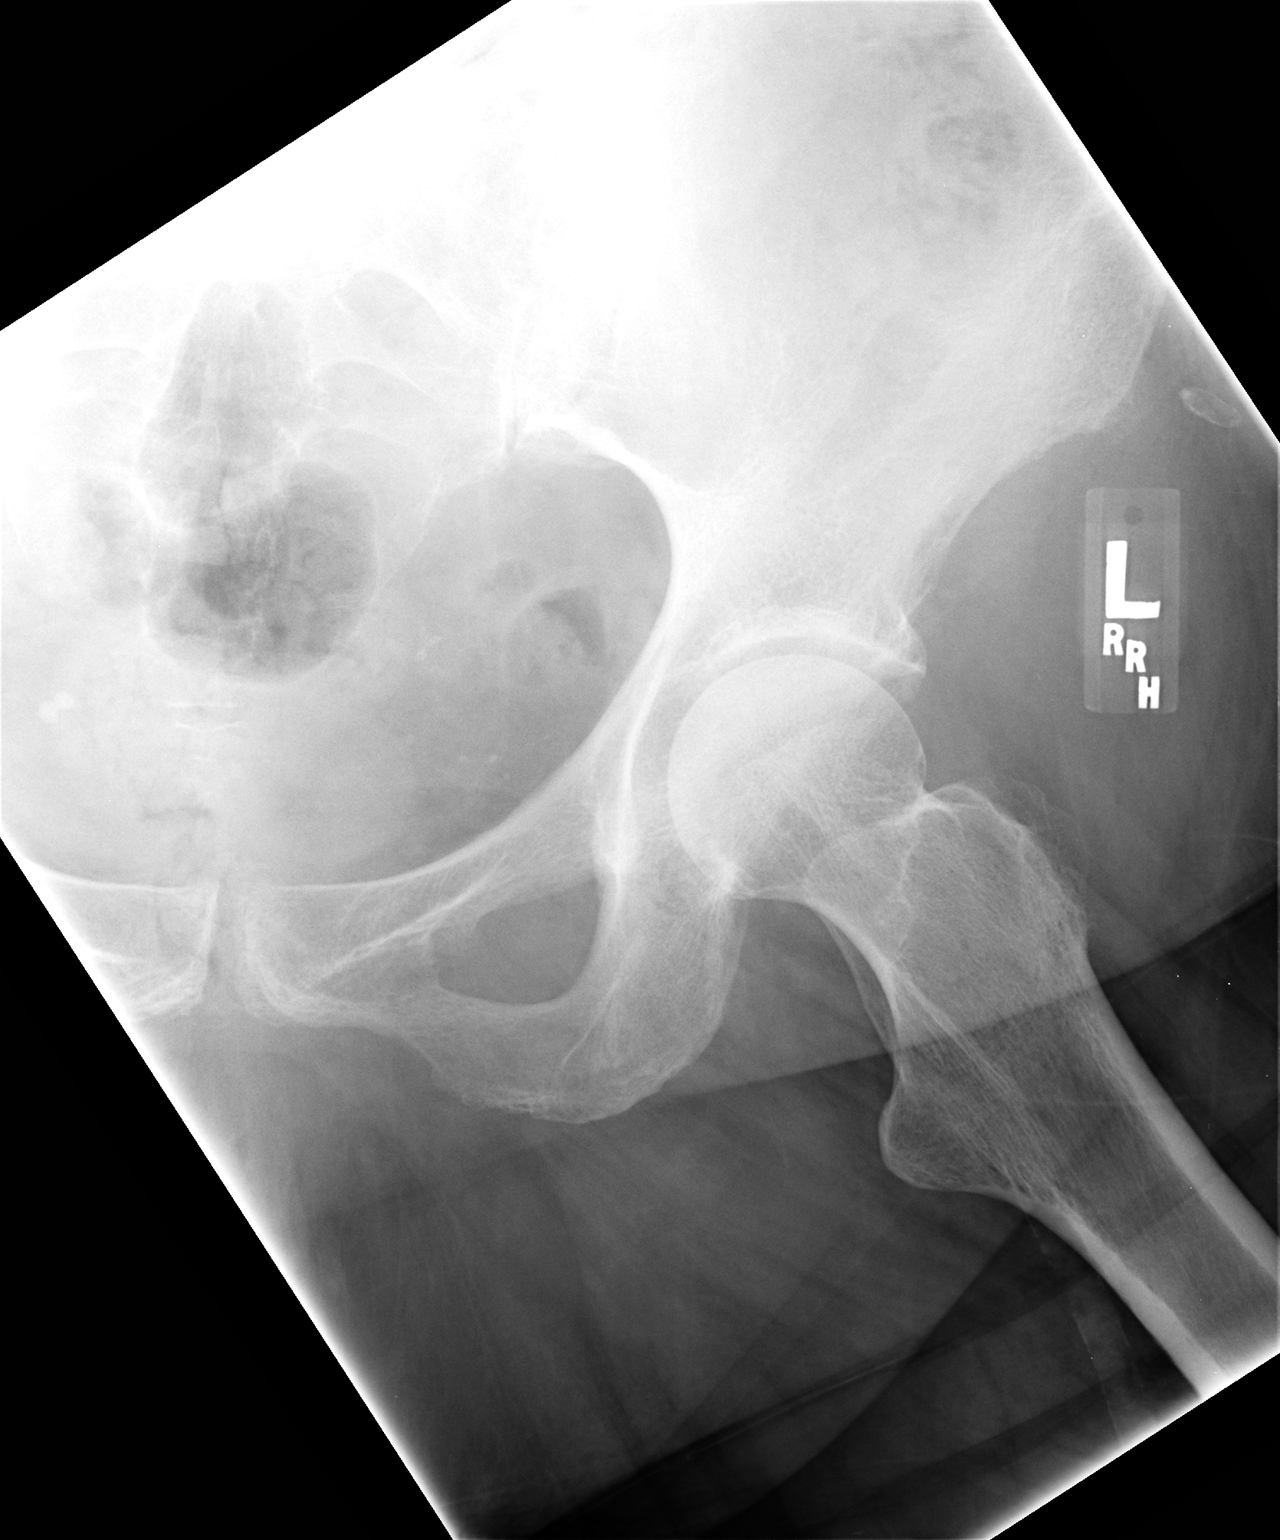

[3 of 3 positions shown; findings below may reference images not displayed]

FINDINGS: Osseous demineralization.
Minimal narrowing of left hip joint.
SI joints preserved.
Pelvis appears intact.
Few fracture, dislocation or bone destruction.
IMPRESSION: Minimal degenerative changes left hip joint.
Osseous demineralization.
No acute abnormalities.

## 2014-06-27 IMAGING — CT CT HEAD W/O CM
1 series · 16 of 30 positions shown, 20 images · non-contrast
Comparison: 01/29/2012

CLINICAL DATA: Acute left sided headache and weakness

CT HEAD WITHOUT CONTRAST
TECHNIQUE: Contiguous axial images were obtained from the base of
the skull through the vertex without contrast.

[Series 2: headseq 4.8 h37s · axial · 0.45mm/px · z∈[+153,+315]mm · 16 of 36 slices shown, 20 images]
[im 2/36  brain]
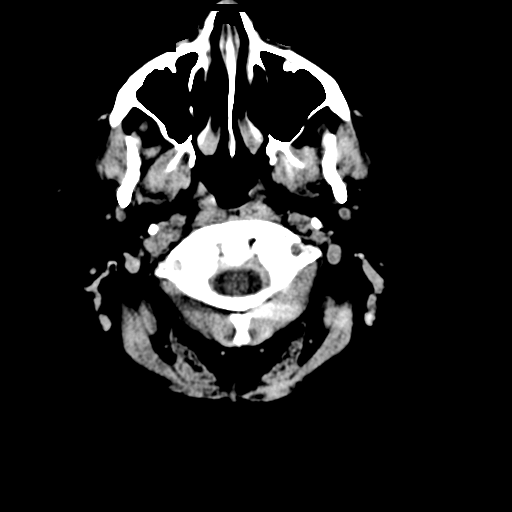
[im 2/36  bone]
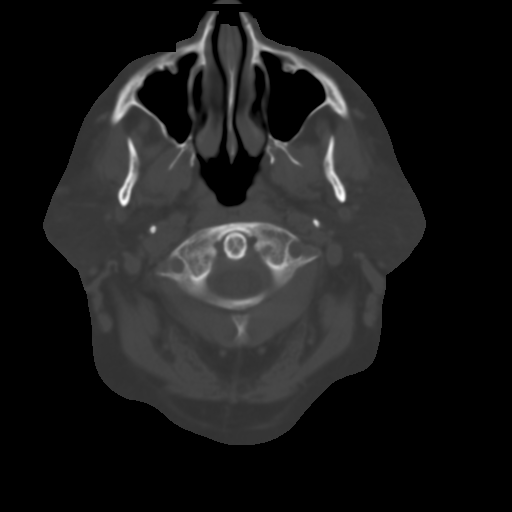
[im 4/36  brain]
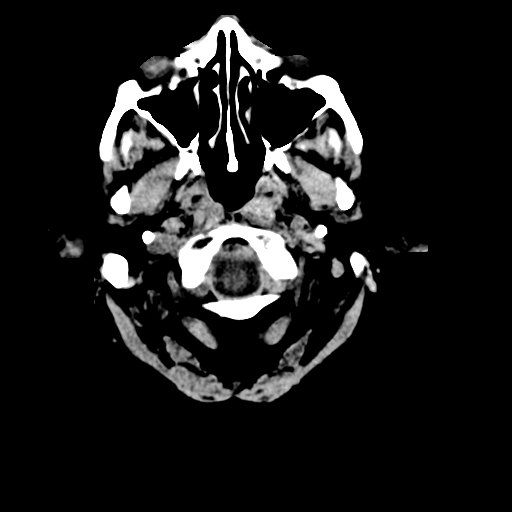
[im 7/36  brain]
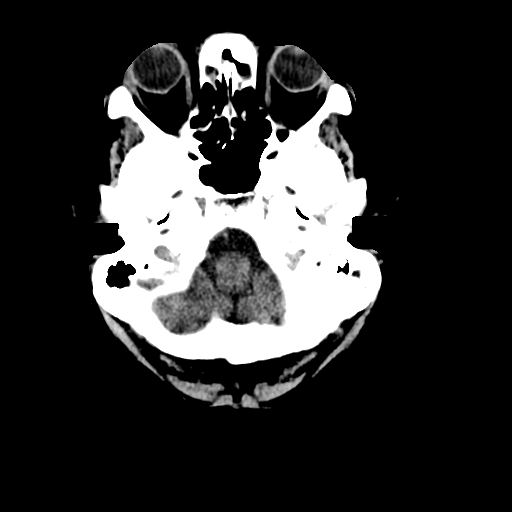
[im 9/36  brain]
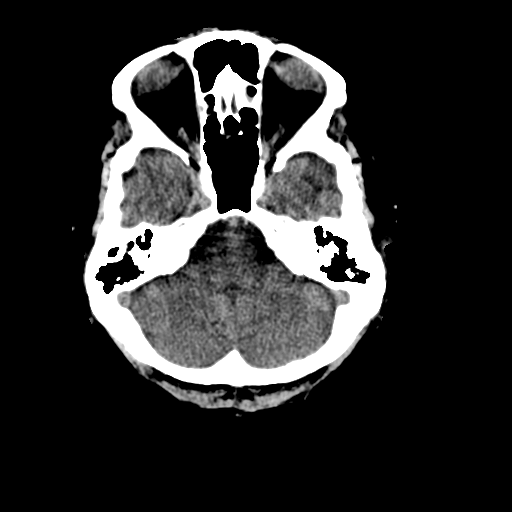
[im 10/36  brain]
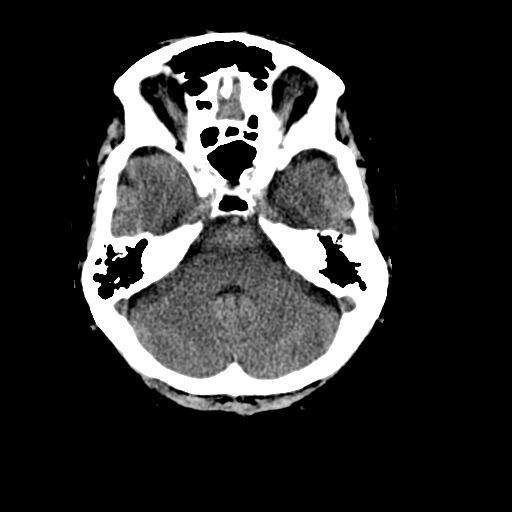
[im 10/36  bone]
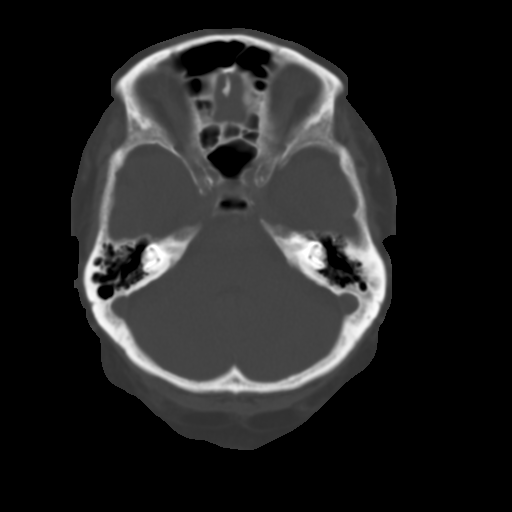
[im 13/36  brain]
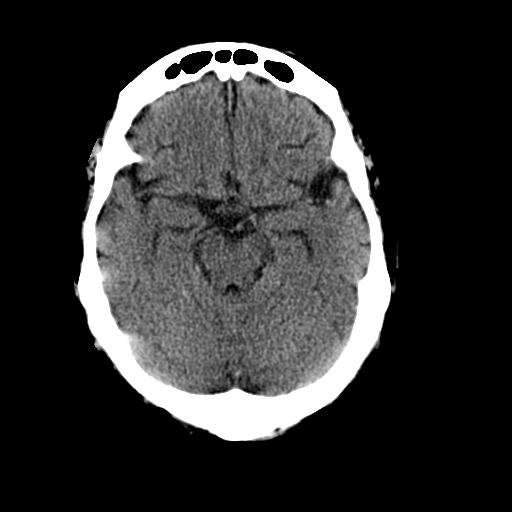
[im 15/36  brain]
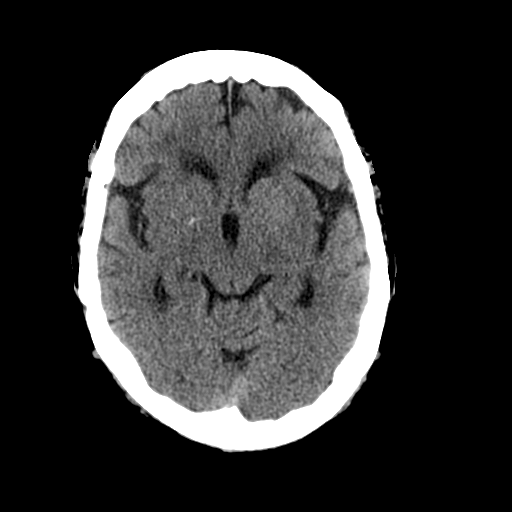
[im 17/36  brain]
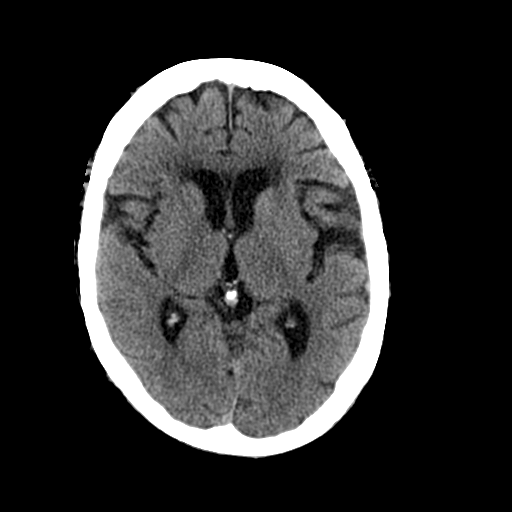
[im 19/36  brain]
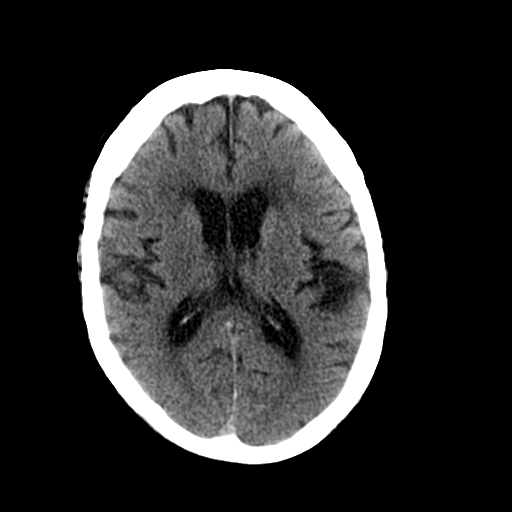
[im 19/36  bone]
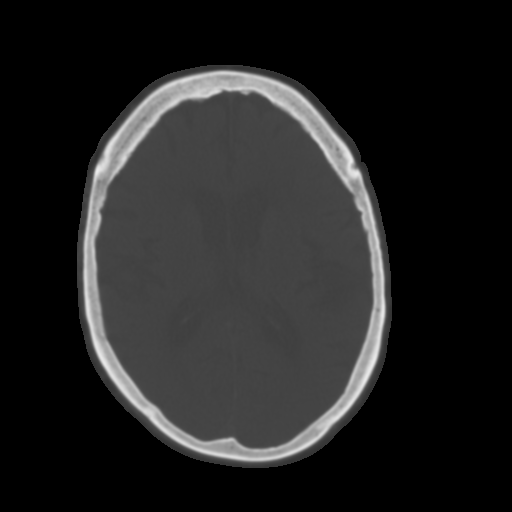
[im 21/36  brain]
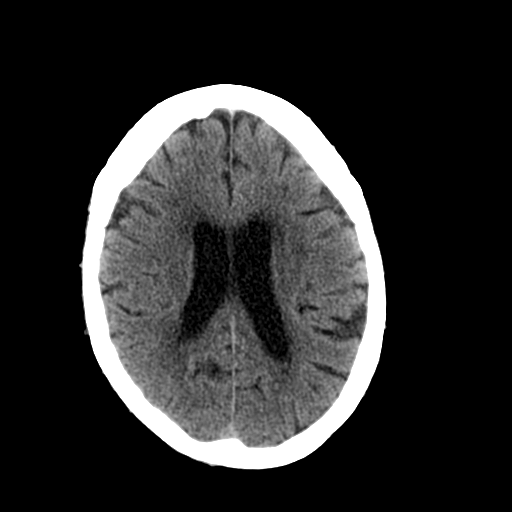
[im 23/36  brain]
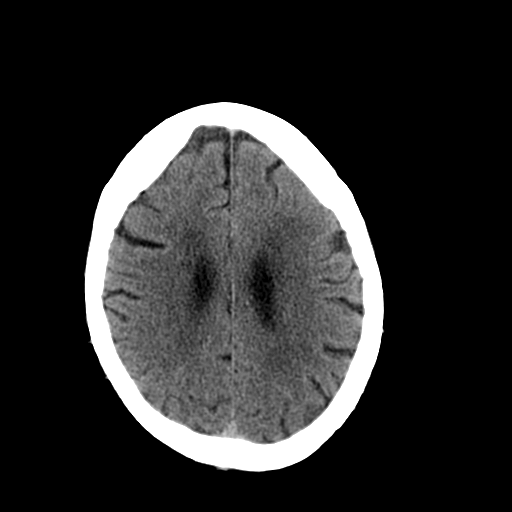
[im 26/36  brain]
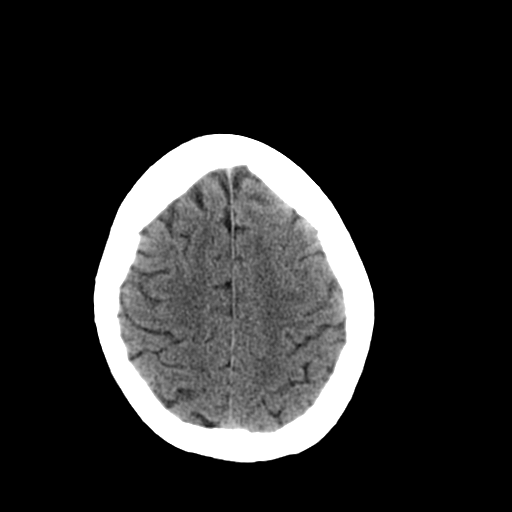
[im 27/36  brain]
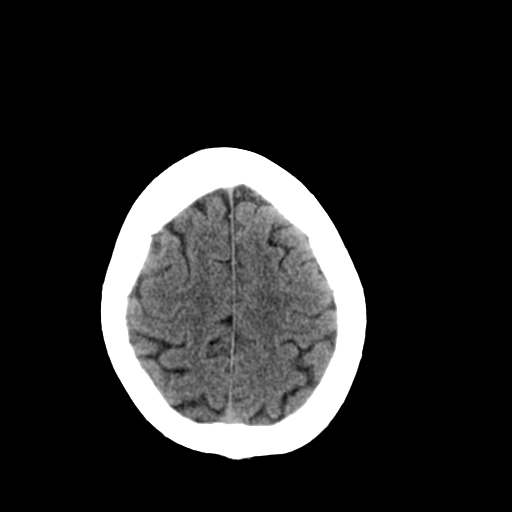
[im 27/36  bone]
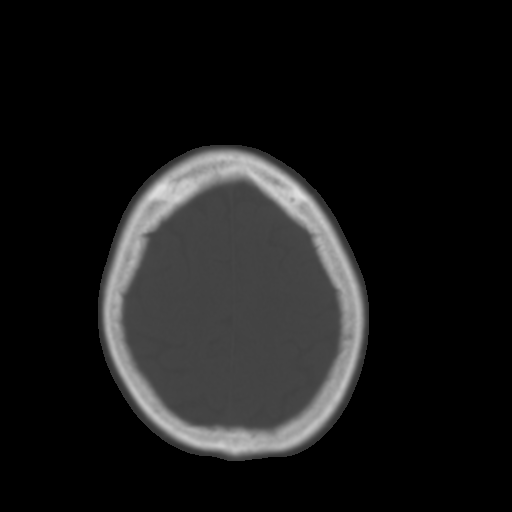
[im 29/36  brain]
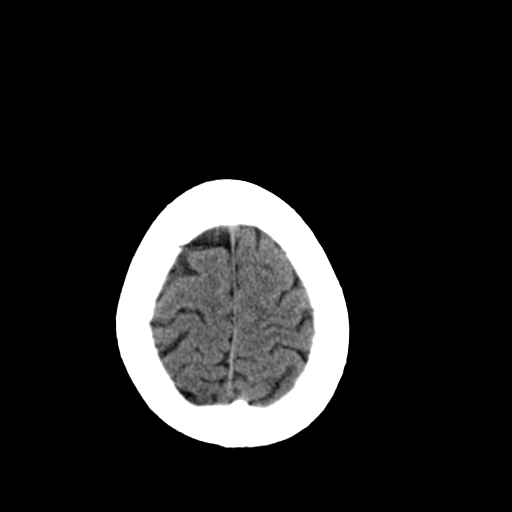
[im 32/36  brain]
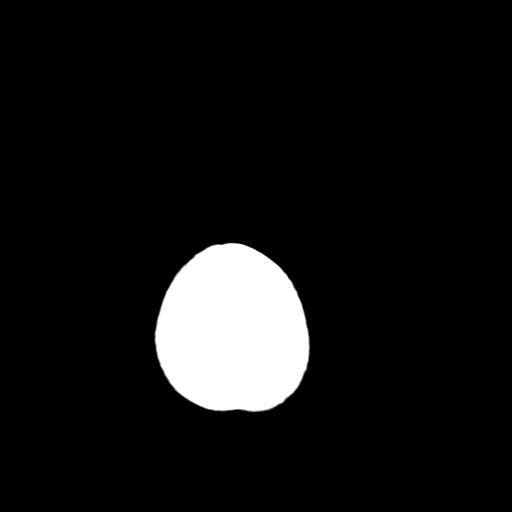
[im 34/36  brain]
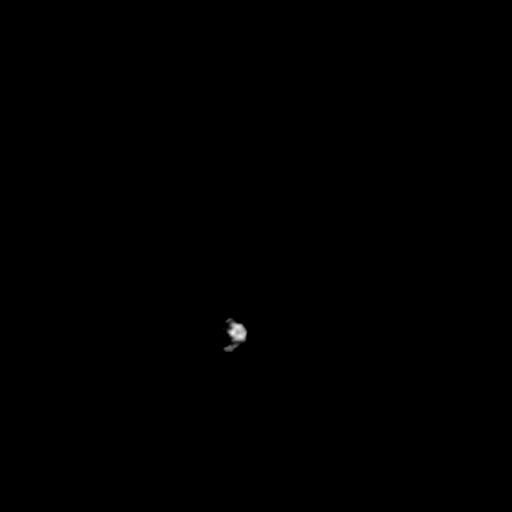

[16 of 30 positions shown; findings below may reference images not displayed]

FINDINGS: There is diffuse patchy low density throughout the
subcortical and periventricular white matter consistent with
chronic small vessel ischemic change.

There is prominence of the sulci and ventricles consistent with
brain atrophy.

There is no evidence for acute brain infarct, hemorrhage or mass.

Paranasal sinuses and mastoid air cells are clear.  The skull is
intact.
IMPRESSION: 1.  No acute intracranial abnormalities.
2.  Small vessel ischemic disease and brain atrophy.

## 2014-06-27 IMAGING — MR MR MRA HEAD W/O CM
9 series · 32 of 48 positions shown · non-contrast
Comparison: 06/22/2012 CT.  01/30/2012 MR.

MRI HEAD

CLINICAL DATA: Left sided headache and weakness.

MRI BRAIN WITHOUT CONTRAST
MRA HEAD WITHOUT CONTRAST
TECHNIQUE: Multiplanar, multiecho pulse sequences of the brain and
surrounding structures were obtained according to standard protocol
without intravenous contrast.  Angiographic images of the head were
obtained using MRA technique without contrast.

[Series 2: T1 · sagittal · 5.0mm · 0.38mm/px · 1 of 17 slices shown (1 of 2)]
[im 1/17]
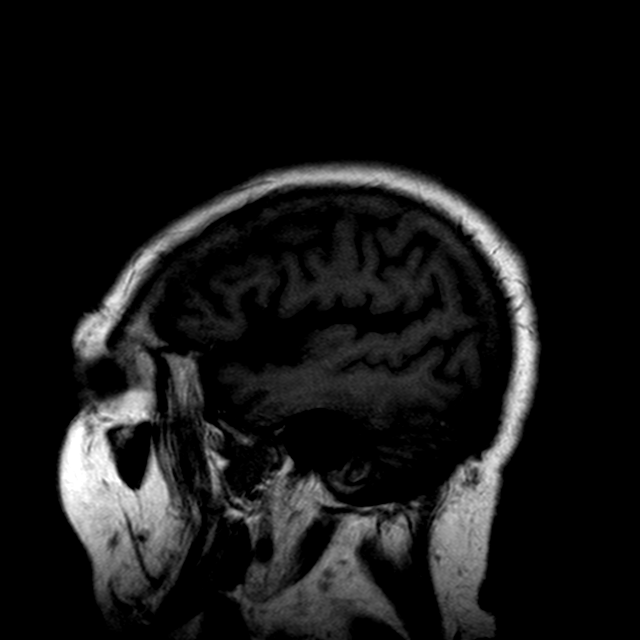

[Series 3: DWI · axial · 5.0mm · 0.72mm/px · z∈[-83,+53]mm · 2 of 22 slices shown (1 of 2)]
[im 1/22]
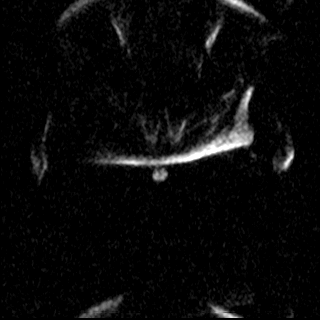
[im 22/22]
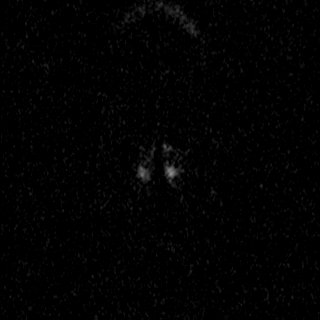

[Series 4: DWI · axial · 5.0mm · 0.72mm/px · z∈[-83,+53]mm · 3 of 22 slices shown (2 of 2)]
[im 1/22]
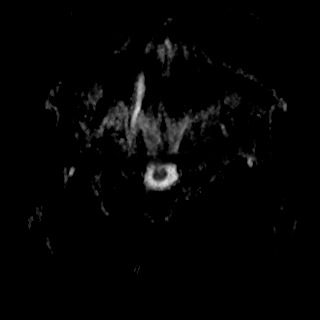
[im 11/22]
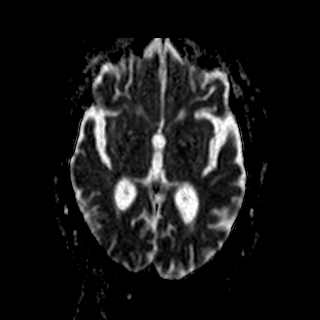
[im 22/22]
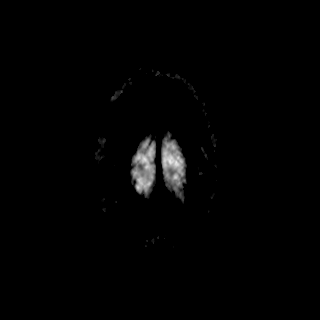

[Series 5: FLAIR · axial · 5.0mm · 0.64mm/px · z∈[-89,+60]mm · 3 of 26 slices shown]
[im 1/26]
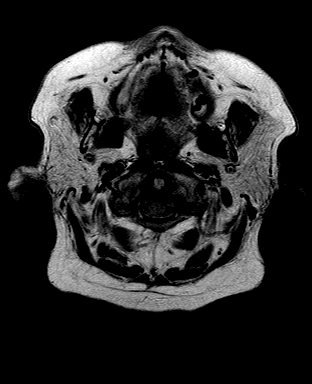
[im 13/26]
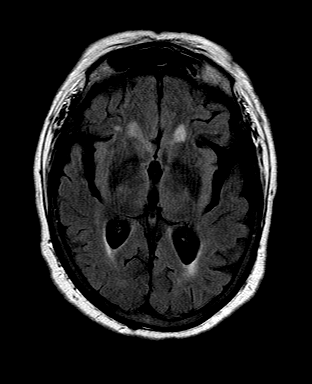
[im 26/26]
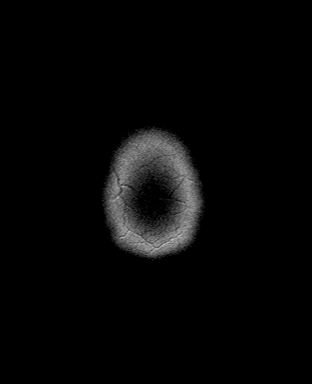

[Series 6: MRA · axial · 0.8mm · 0.31mm/px · z∈[-108,-46]mm · 5 of 154 slices shown]
[im 9/154]
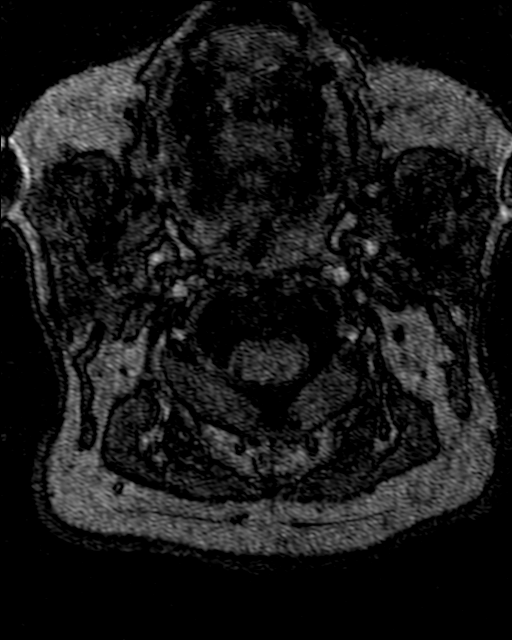
[im 26/154]
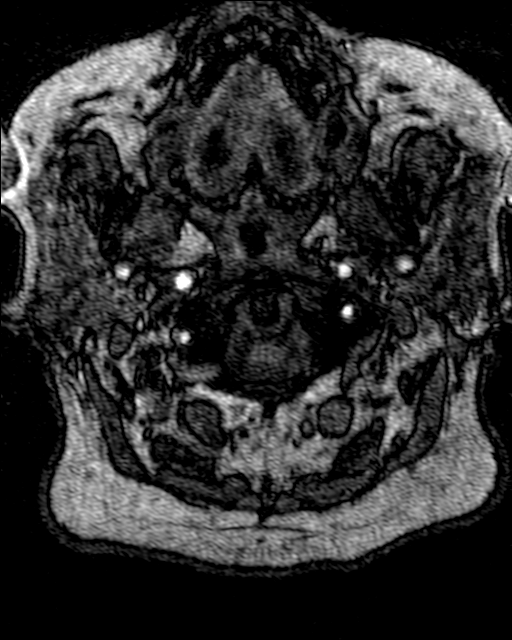
[im 43/154]
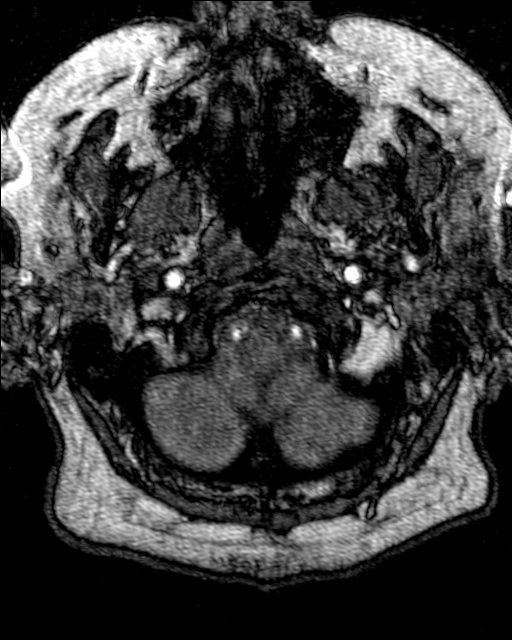
[im 69/154]
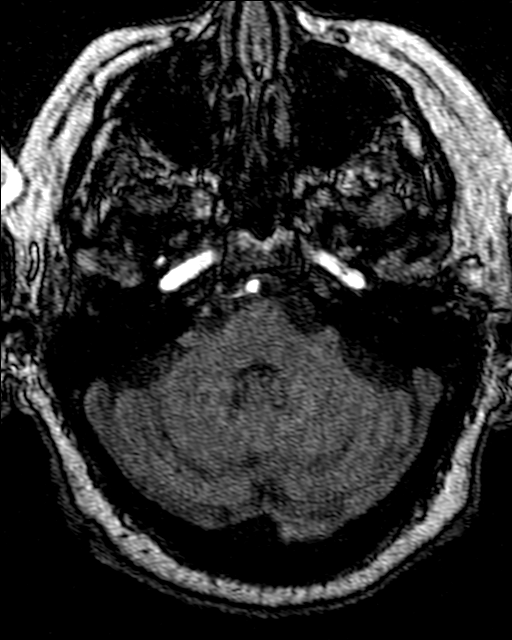
[im 86/154]
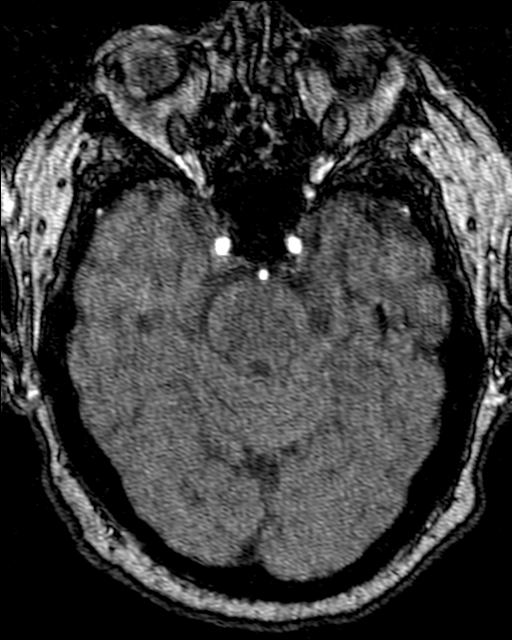

[Series 9: T2 · axial · 5.0mm · 0.94mm/px · z∈[-83,+54]mm · 3 of 24 slices shown (1 of 3)]
[im 1/24]
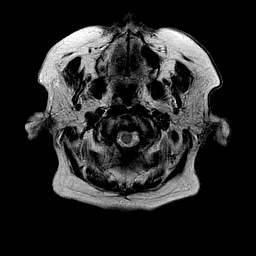
[im 12/24]
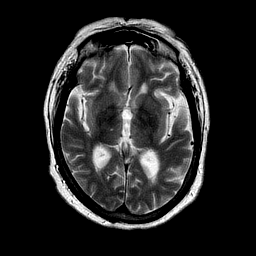
[im 24/24]
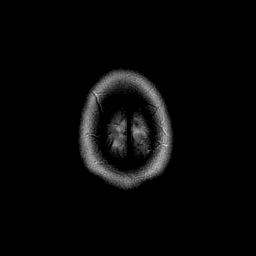

[Series 10: T1 · axial · 2.0mm · 0.75mm/px · z∈[-94,+64]mm · 8 of 80 slices shown (2 of 2)]
[im 1/80]
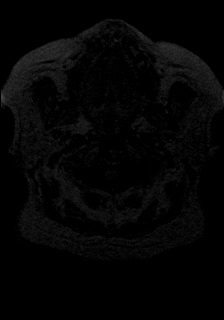
[im 9/80]
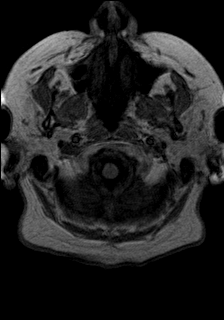
[im 27/80]
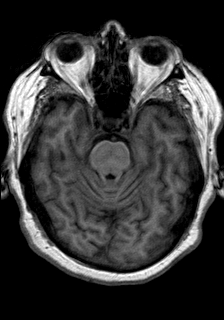
[im 36/80]
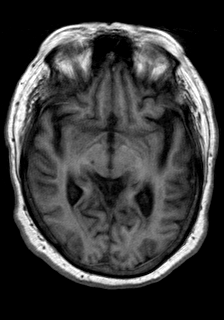
[im 44/80]
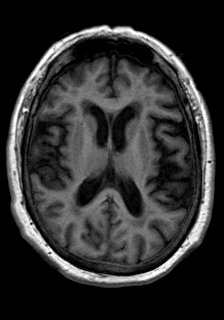
[im 53/80]
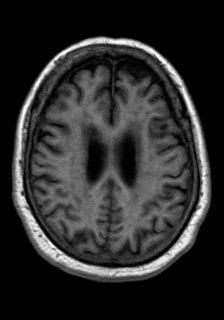
[im 71/80]
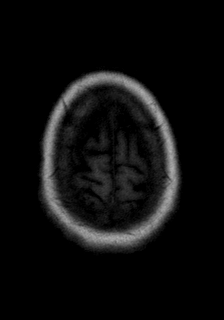
[im 80/80]
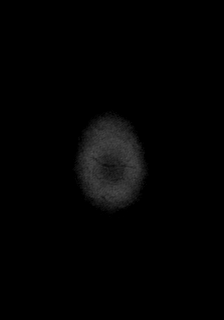

[Series 14: T2 · axial · 5.0mm · 0.47mm/px · z∈[-78,+49]mm · 3 of 24 slices shown (2 of 3)]
[im 1/24]
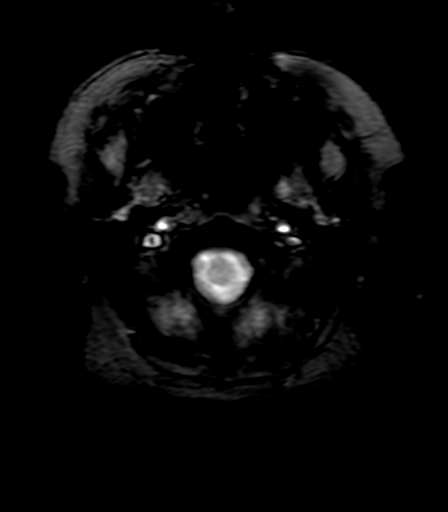
[im 12/24]
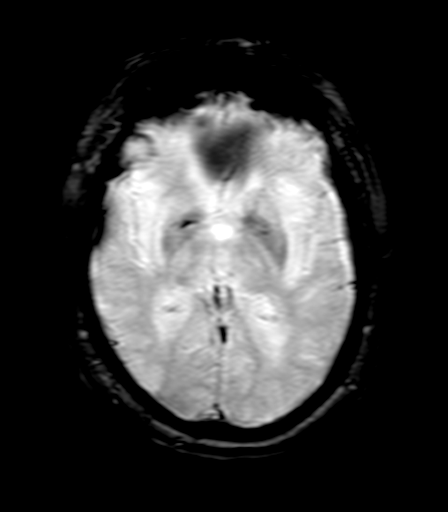
[im 24/24]
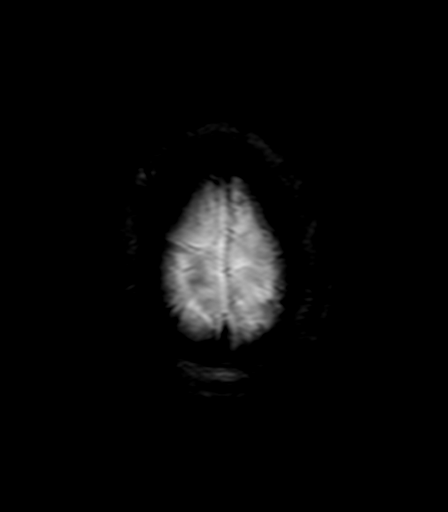

[Series 19: T2 · coronal · 5.0mm · 0.84mm/px · 4 of 30 slices shown (3 of 3)]
[im 1/30]
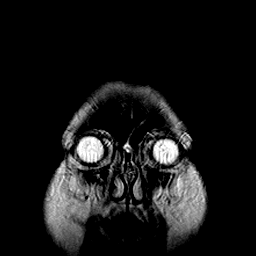
[im 10/30]
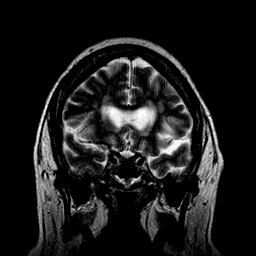
[im 20/30]
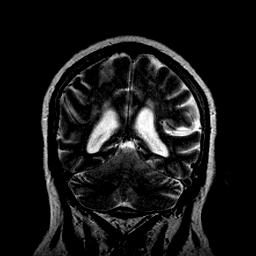
[im 30/30]
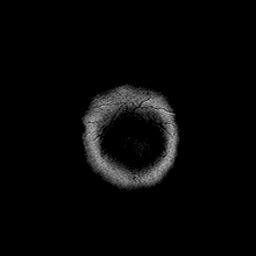

[32 of 48 positions shown; findings below may reference images not displayed]

FINDINGS: No acute infarct.

Prominent small vessel disease type changes.

Global atrophy without hydrocephalus.

No intracranial hemorrhage.

No intracranial mass lesion detected on this unenhanced exam..

Transverse ligament atrophy with mild spinal stenosis.
IMPRESSION: No acute infarct.

Prominent small vessel disease type changes.

Atrophy.

MRA HEAD
FINDINGS: Moderate narrowing of the cavernous segment of the
internal carotid artery bilaterally more notable on the left where
there may be a focal high-grade stenosis.

Moderate narrowing supraclinoid aspect of the internal carotid
artery more notable on the left.

Hypoplastic narrowed and irregular appearing A1 segment of the
right anterior cerebral artery.

Fetal type origin of the right posterior cerebral artery with
moderate to marked tandem stenosis.

Moderate to marked focal stenosis M1 segment right middle cerebral
artery.

Moderate to marked stenosis right middle cerebral artery
bifurcation.

Moderate to marked narrowing of the middle cerebral artery branch
vessels bilaterally including proximal left middle cerebral artery
branch vessel significant narrowing.

Tiny bulge anterior communicating artery region.  Tiny aneurysm
(less than 1.5 mm) not excluded.

Mild irregularity and narrowing of the vertebral arteries
bilaterally.  Focal stenosis distal right vertebral artery may be
present.

Poor delineation right PICA which may be superiorly located just
prior to the formation of the basilar artery.  Moderate tandem
stenosis left PICA.

Nonvisualization AICAs.

Mild narrowing and irregularity of the basilar artery most notable
mid aspect.

Nonvisualization left superior cerebellar artery.

Moderate to marked stenosis involving portions of the posterior
cerebral arteries bilaterally.
IMPRESSION: Prominent intracranial atherosclerotic type changes as detailed
above.

Tiny bulge anterior communicating artery region.  Tiny aneurysm
(less than 1.5 mm) not excluded..

## 2014-06-28 IMAGING — US US CAROTID DUPLEX BILAT
1 series · 13 of 24 positions shown · non-contrast
Comparison: None.

CLINICAL DATA: TIA symptoms, diabetes

BILATERAL CAROTID DUPLEX ULTRASOUND
TECHNIQUE: Gray scale imaging, color Doppler and duplex ultrasound
was performed of bilateral carotid and vertebral arteries in the
neck.

[Series 1: us carotid duplex bilat · 0.08mm/px · 13 of 68 slices shown]
[im 1/68]
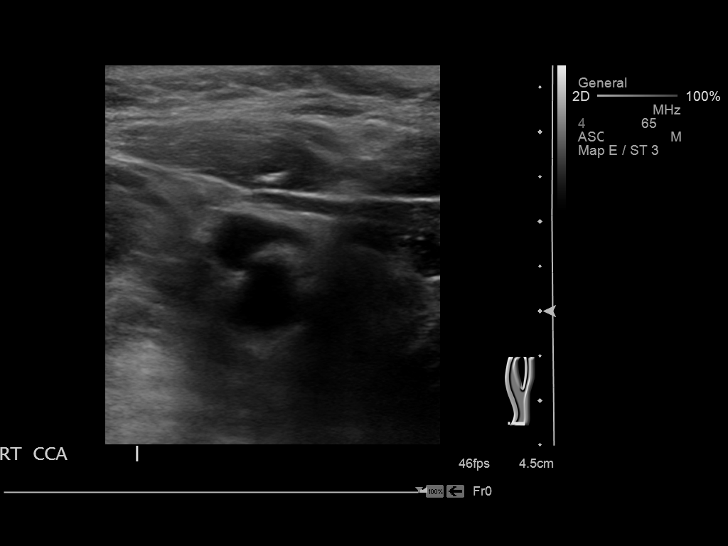
[im 6/68]
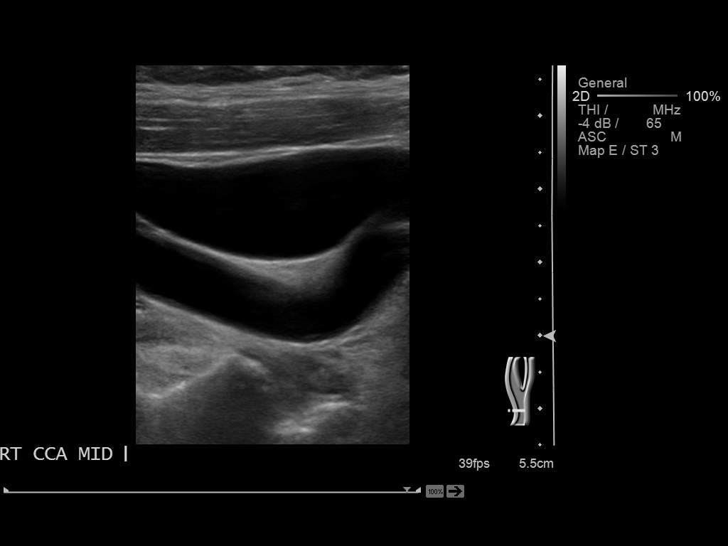
[im 12/68]
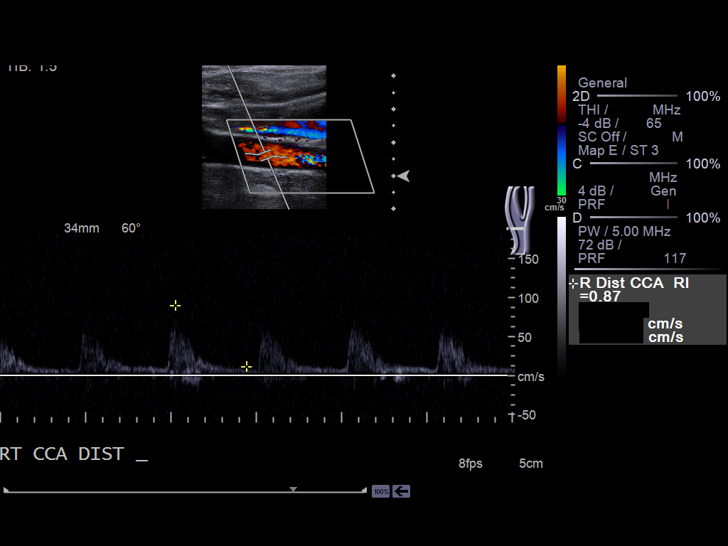
[im 18/68]
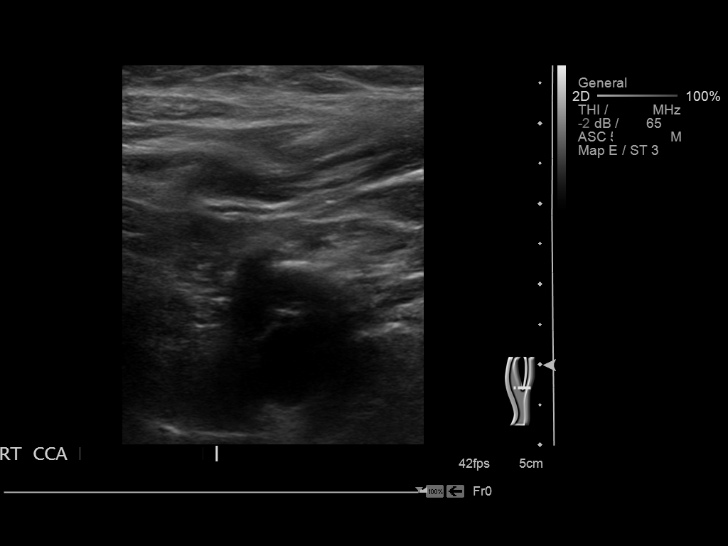
[im 24/68]
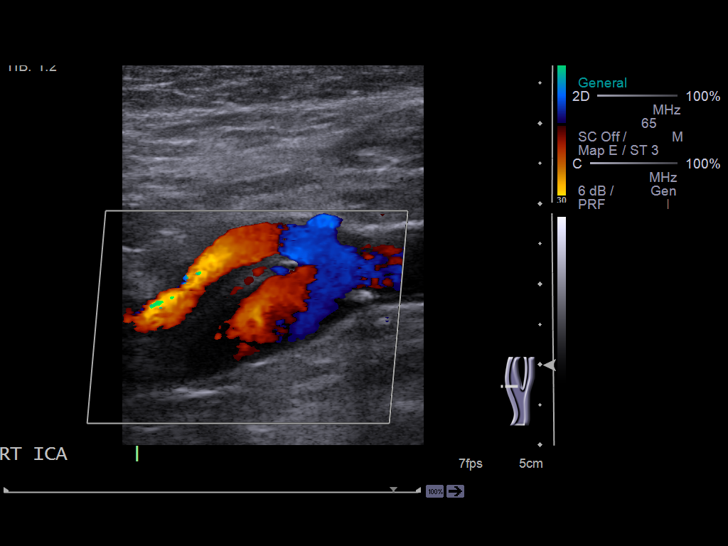
[im 30/68]
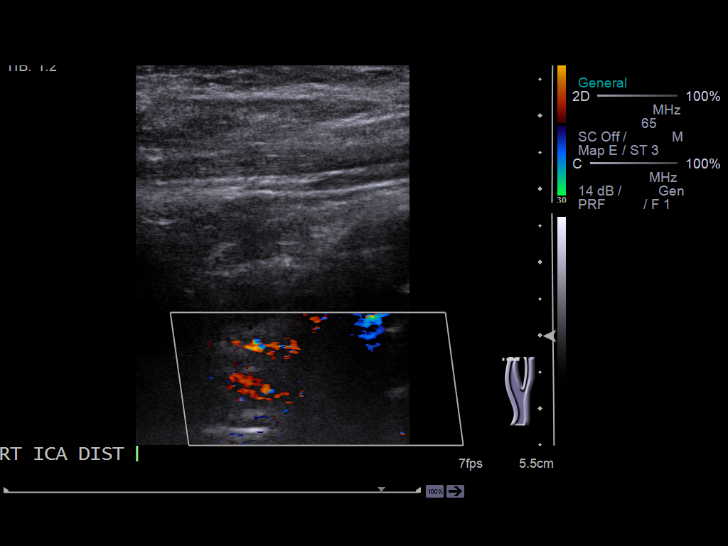
[im 35/68]
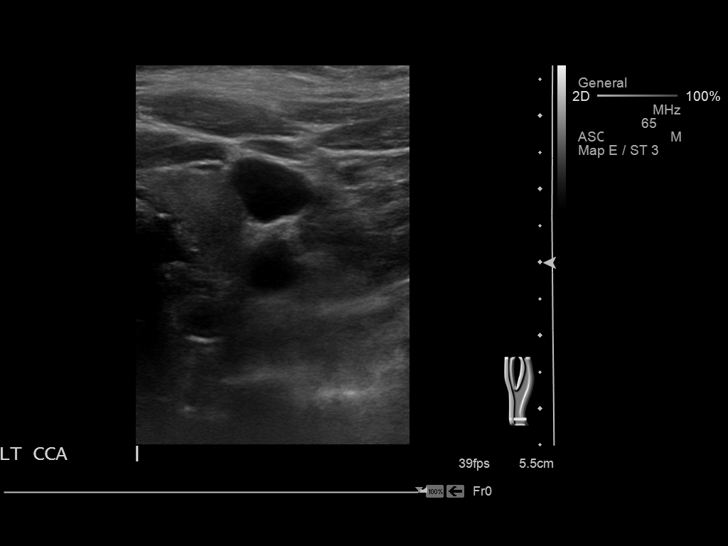
[im 38/68]
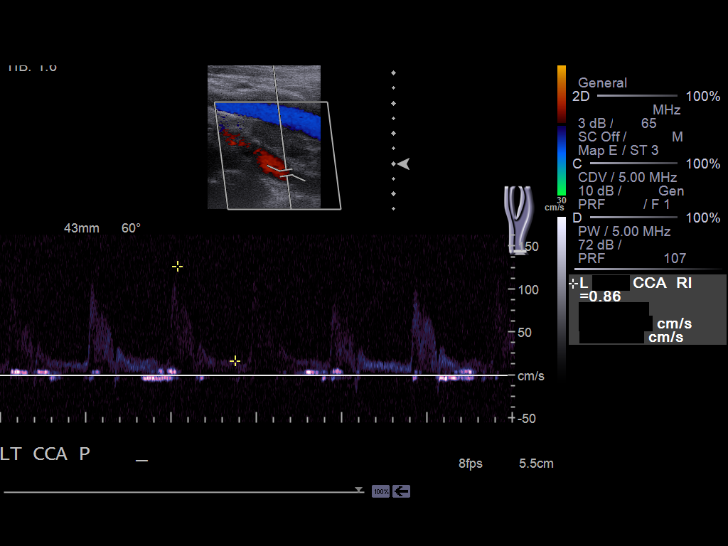
[im 44/68]
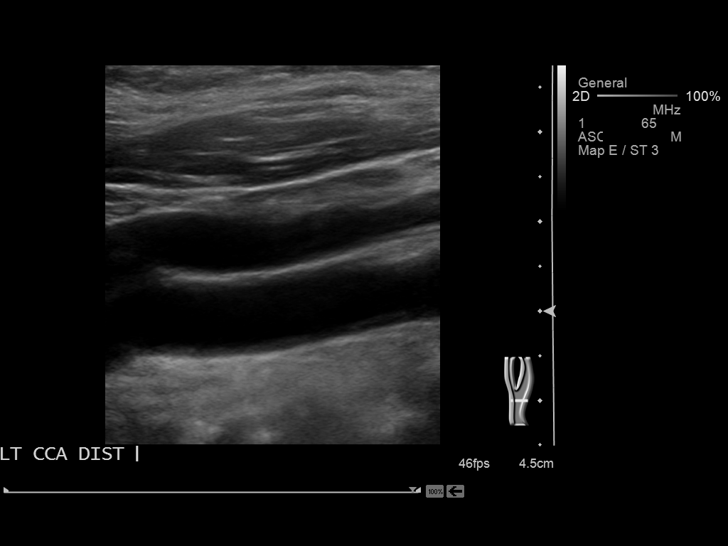
[im 50/68]
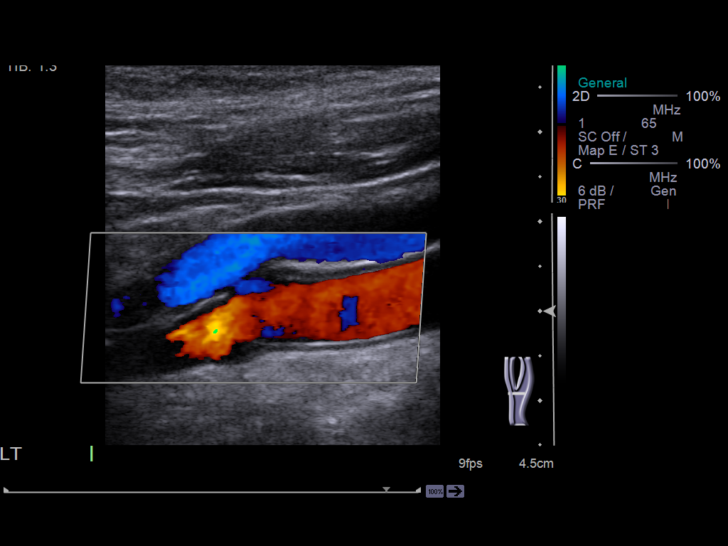
[im 56/68]
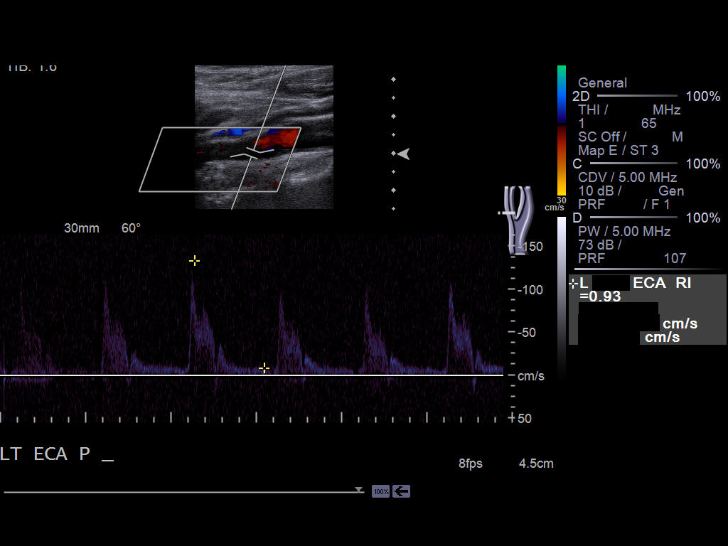
[im 62/68]
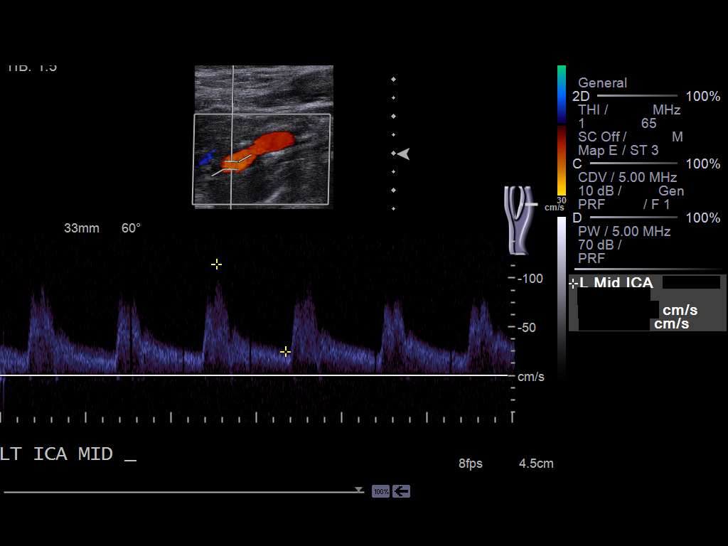
[im 68/68]
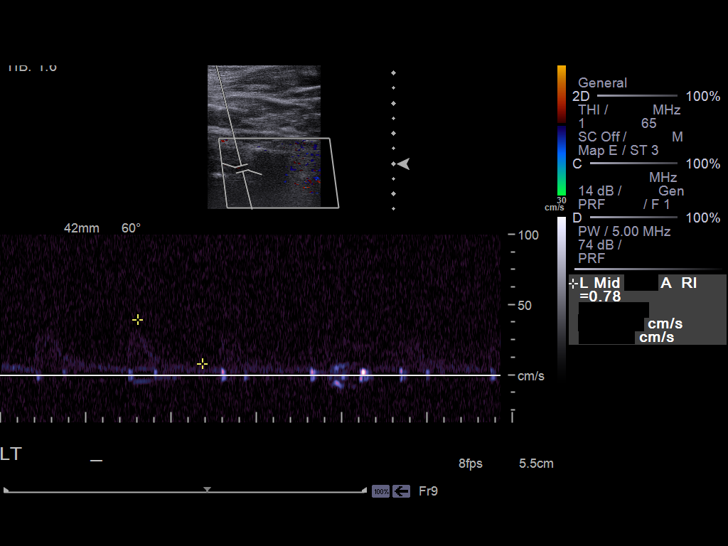

[13 of 24 positions shown; findings below may reference images not displayed]

Criteria:  Quantification of carotid stenosis is based on velocity
parameters that correlate the residual internal carotid diameter
with NASCET-based stenosis levels, using the diameter of the distal
internal carotid lumen as the denominator for stenosis measurement.

The following velocity measurements were obtained:

                 PEAK SYSTOLIC/END DIASTOLIC
RIGHT
ICA:                        102/13cm/sec
CCA:                        108/13cm/sec
SYSTOLIC ICA/CCA RATIO:
DIASTOLIC ICA/CCA RATIO:
ECA:                        162cm/sec

LEFT
ICA:                        115/25cm/sec
CCA:                        126/17cm/sec
SYSTOLIC ICA/CCA RATIO:
DIASTOLIC ICA/CCA RATIO:
ECA:                        134cm/sec
FINDINGS: RIGHT CAROTID ARTERY: Minor atherosclerotic changes.  No
hemodynamically significant ICA stenosis, velocity elevation, or
turbulent flow.

RIGHT VERTEBRAL ARTERY:  Antegrade

LEFT CAROTID ARTERY: Minor similar atherosclerotic changes.  No
hemodynamically significant left ICA stenosis, velocity elevation,
or turbulent flow.

LEFT VERTEBRAL ARTERY:  Antegrade
IMPRESSION: Minor carotid atherosclerosis.  No hemodynamically significant ICA
stenosis by ultrasound on either side

## 2014-07-21 IMAGING — MR MR HEAD W/O CM
6 of 8 series · 28 of 48 positions shown · non-contrast
Comparison: 06/22/2012 and earlier.

CLINICAL DATA: 75-year-old female with left facial droop, left side
weakness.

MRI HEAD WITHOUT CONTRAST
TECHNIQUE: Multiplanar, multiecho pulse sequences of the brain and
surrounding structures were obtained according to standard protocol
without intravenous contrast.

[Series 3: DWI · axial · 5.0mm · 0.72mm/px · z∈[-18,+132]mm · 4 of 24 slices shown (1 of 2)]
[im 1/24]
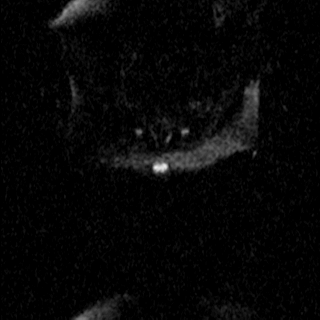
[im 8/24]
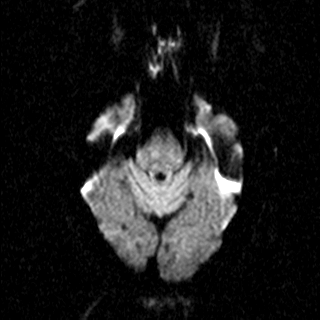
[im 16/24]
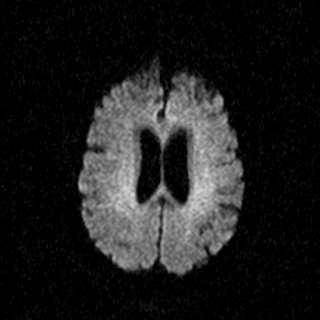
[im 24/24]
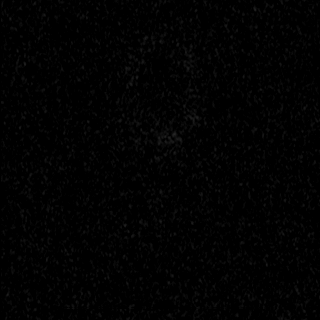

[Series 4: DWI · axial · 5.0mm · 0.72mm/px · z∈[-18,+132]mm · 5 of 24 slices shown (2 of 2)]
[im 1/24]
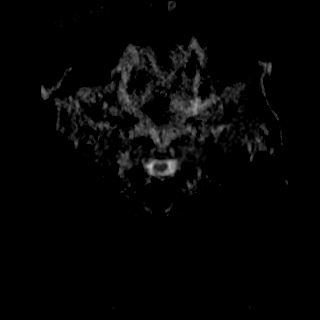
[im 6/24]
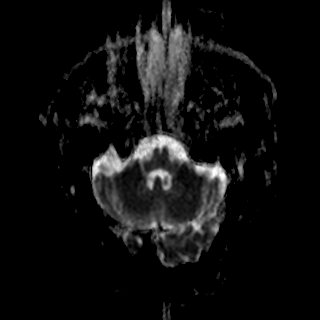
[im 12/24]
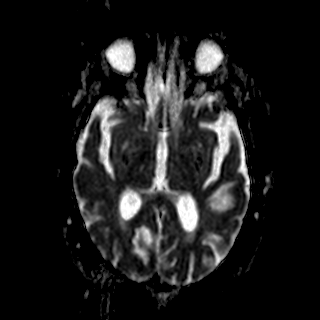
[im 18/24]
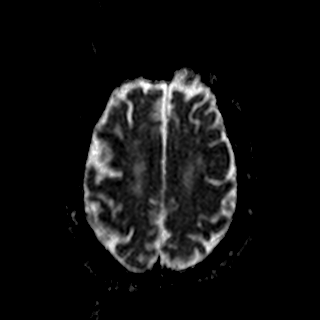
[im 24/24]
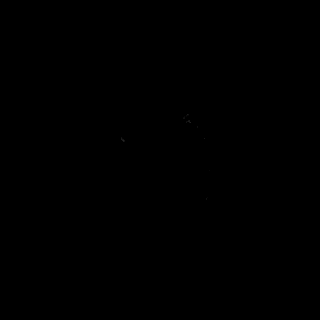

[Series 5: FLAIR · axial · 5.0mm · 0.62mm/px · z∈[-17,+133]mm · 5 of 26 slices shown]
[im 1/26]
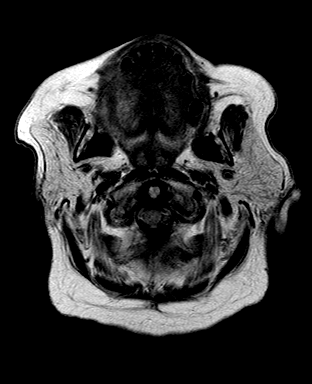
[im 7/26]
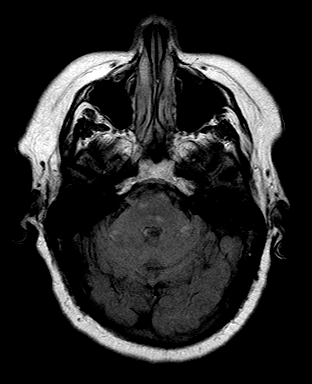
[im 13/26]
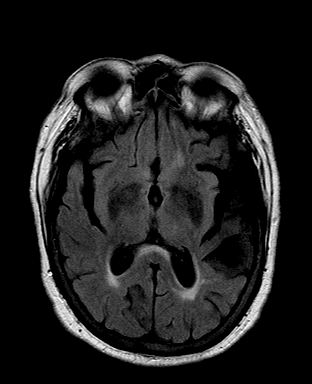
[im 19/26]
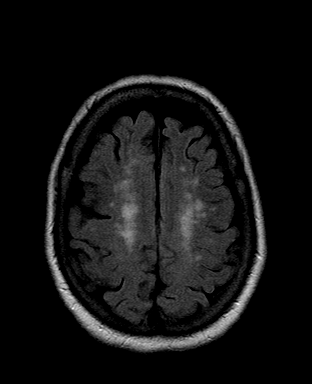
[im 26/26]
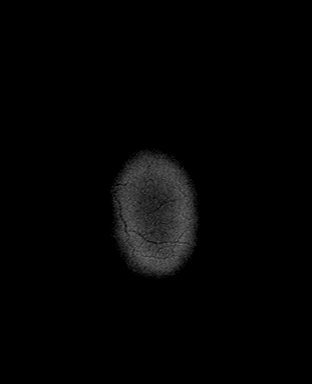

[Series 6: T2 · axial · 5.0mm · 0.62mm/px · z∈[-11,+127]mm · 5 of 24 slices shown (1 of 3)]
[im 1/24]
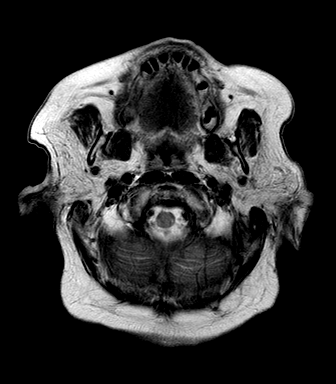
[im 6/24]
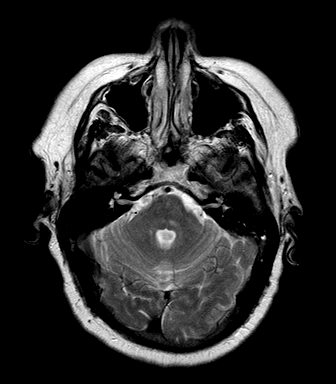
[im 12/24]
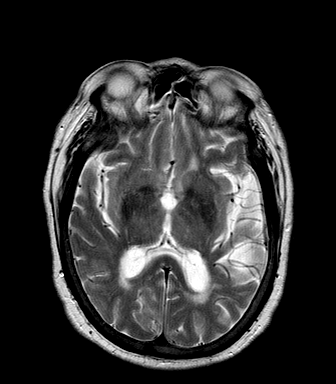
[im 18/24]
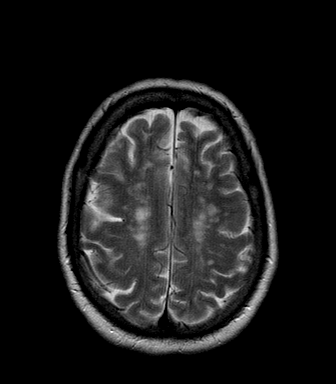
[im 24/24]
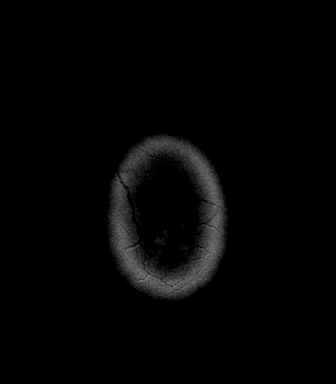

[Series 8: T2 · axial · 5.0mm · 0.47mm/px · z∈[-10,+128]mm · 5 of 24 slices shown (2 of 3)]
[im 1/24]
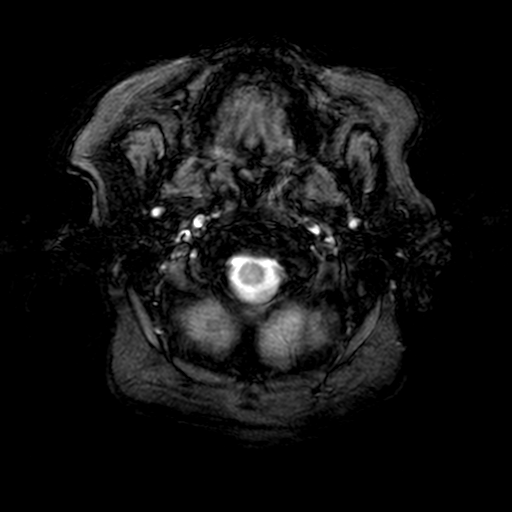
[im 6/24]
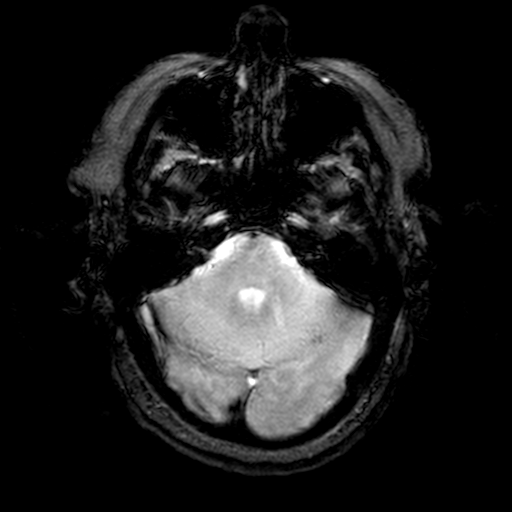
[im 12/24]
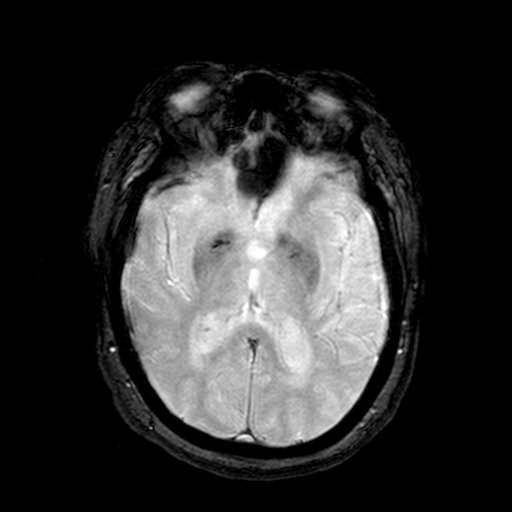
[im 18/24]
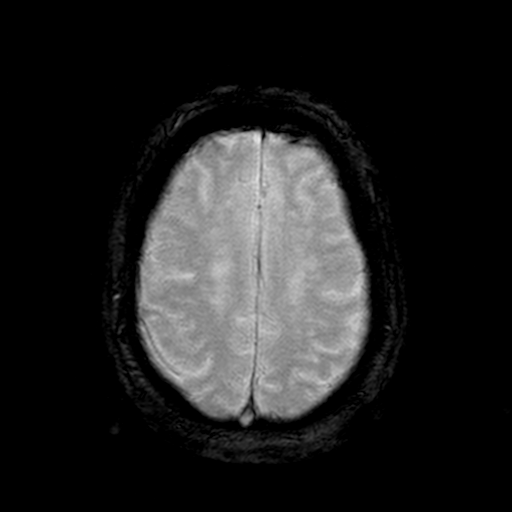
[im 24/24]
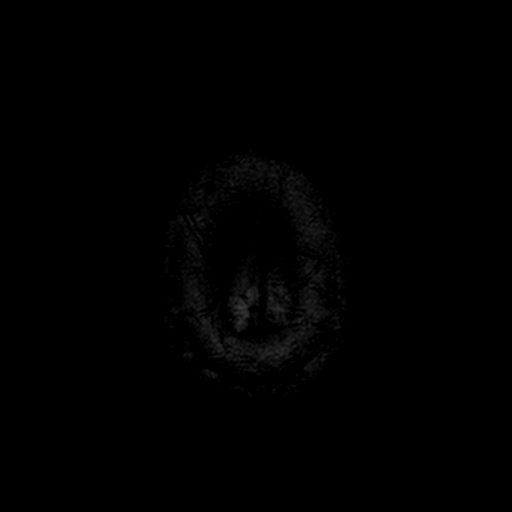

[Series 9: T2 · coronal · 5.0mm · 0.36mm/px · 4 of 30 slices shown (3 of 3)]
[im 1/30]
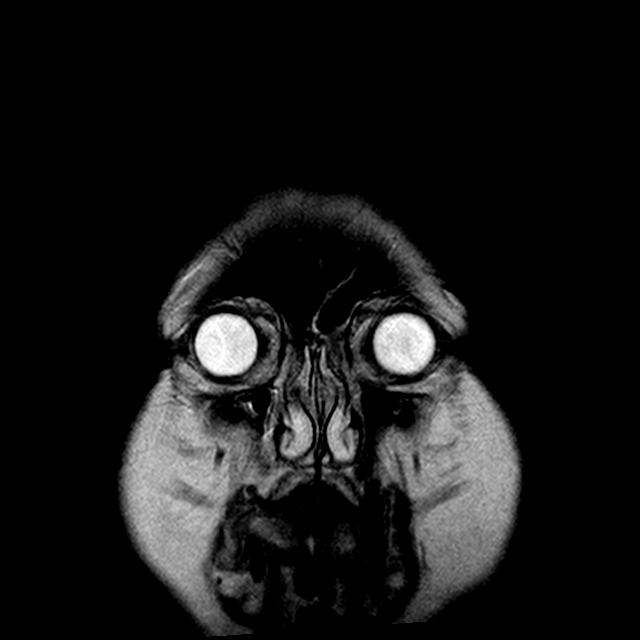
[im 6/30]
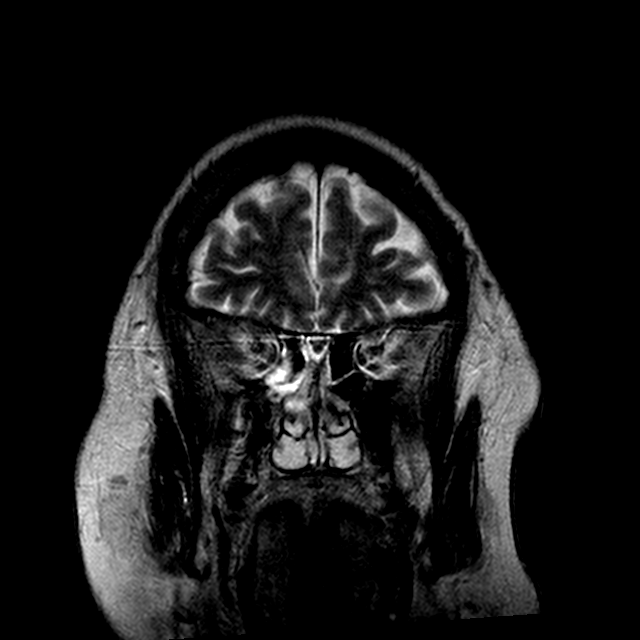
[im 12/30]
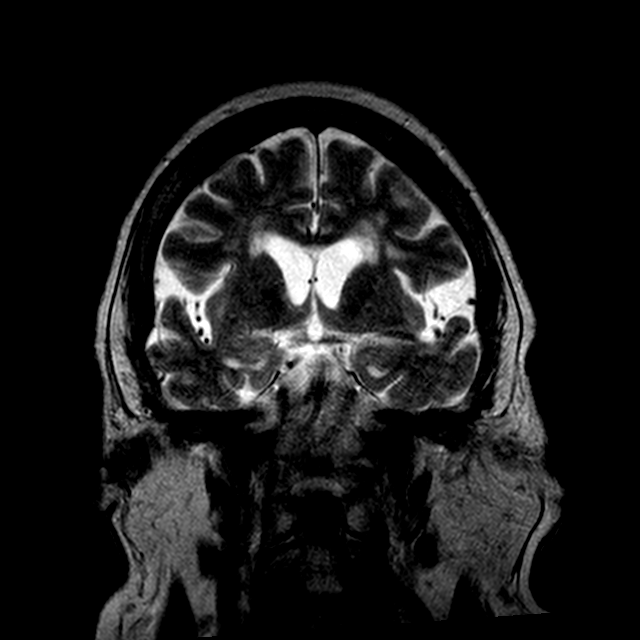
[im 18/30]
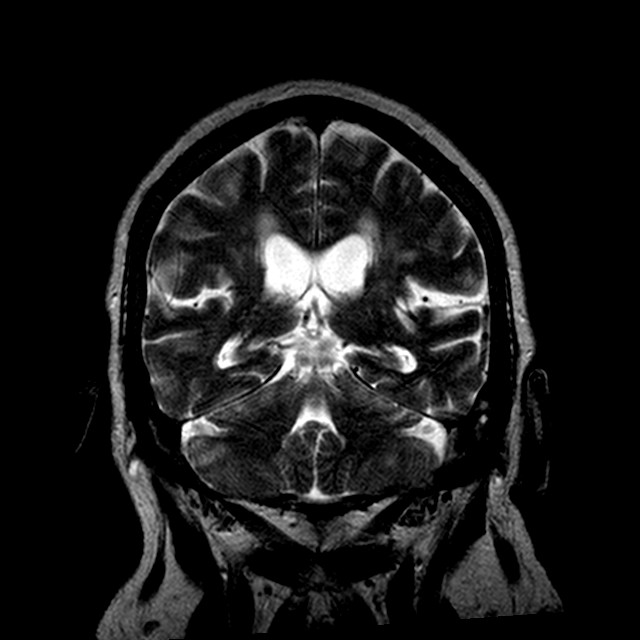

[28 of 48 positions shown; findings below may reference images not displayed]

FINDINGS: No restricted diffusion to suggest acute infarction.  No
midline shift, mass effect, evidence of mass lesion,
ventriculomegaly, extra-axial collection or acute intracranial
hemorrhage.  Cervicomedullary junction and pituitary are within
normal limits.  Major intracranial vascular flow voids are stable.
Stable patchy and confluent cerebral white matter T2 and FLAIR
hyperintensity.  Stable comparatively mild central T2
hyperintensity in the pons.  No acute signal changes.  Stable
visualized cervical spine.  Normal bone marrow signal.  Stable
orbits.

Continued paranasal sinus inflammatory changes.  Minor left mastoid
fluid signal is stable.  Negative visualized nasopharynx.  Grossly
stable and normal visualized internal auditory structures.
Negative scalp soft tissues.
IMPRESSION: No acute intracranial abnormality.  Stable noncontrast MRI
appearance of the brain.

## 2014-07-21 IMAGING — CT CT HEAD W/O CM
1 series · 16 of 30 positions shown, 20 images · non-contrast
Comparison: 06/22/2012

CLINICAL DATA: Left facial droop, code stroke headache, history of
stroke, hypertension, diabetes

CT HEAD WITHOUT CONTRAST
TECHNIQUE: Contiguous axial images were obtained from the base of
the skull through the vertex without contrast.

[Series 2: headseq 4.8 h37s · axial · 0.43mm/px · z∈[-627,-469]mm · 16 of 36 slices shown, 20 images]
[im 2/36  brain]
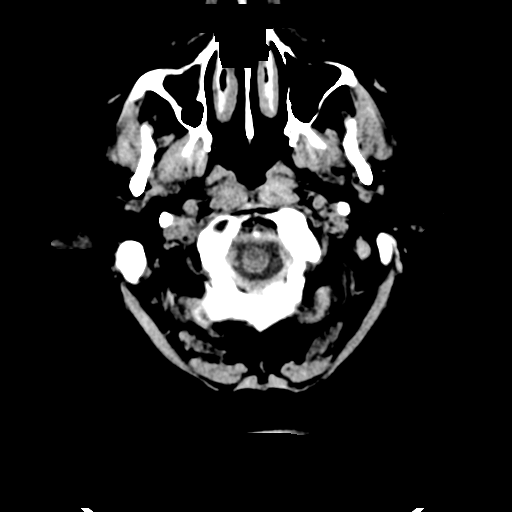
[im 2/36  bone]
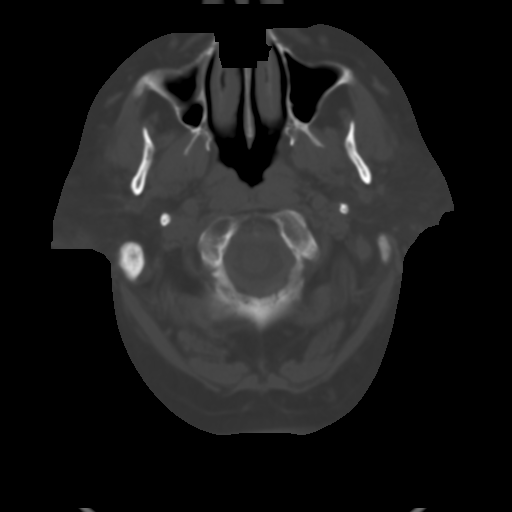
[im 4/36  brain]
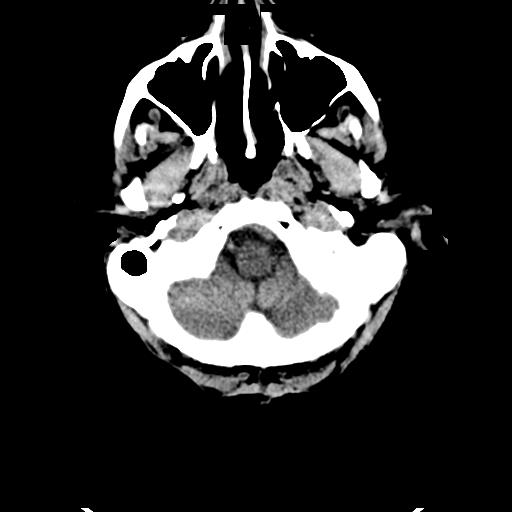
[im 7/36  brain]
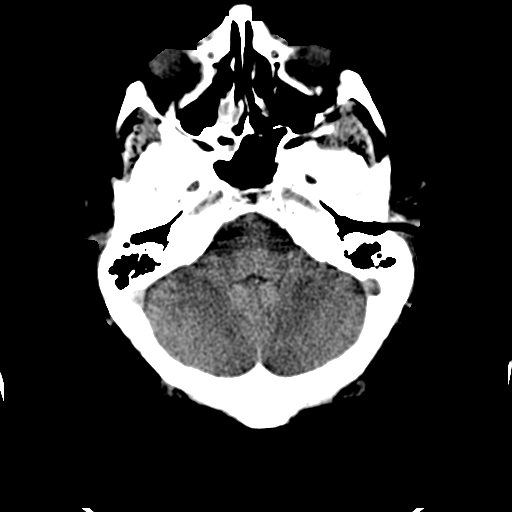
[im 9/36  brain]
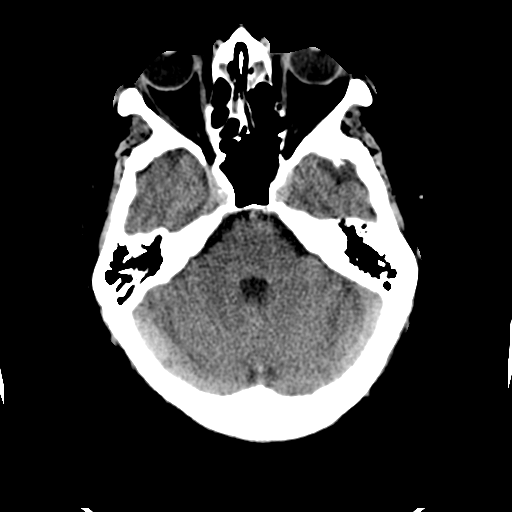
[im 10/36  brain]
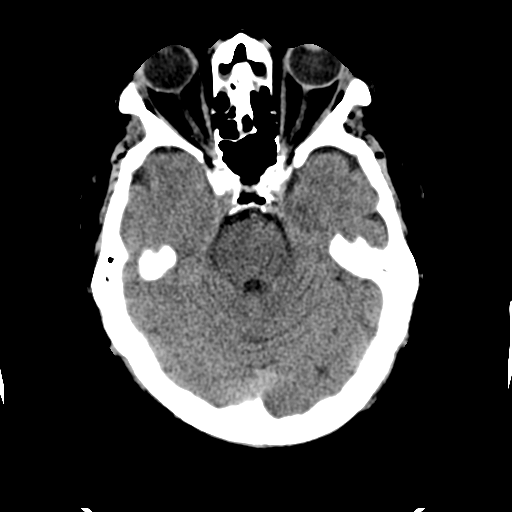
[im 10/36  bone]
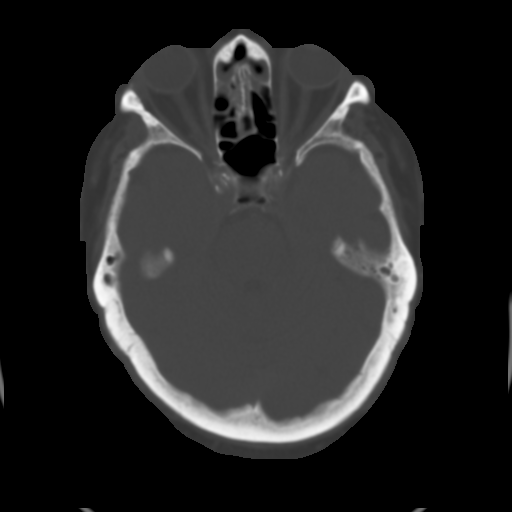
[im 13/36  brain]
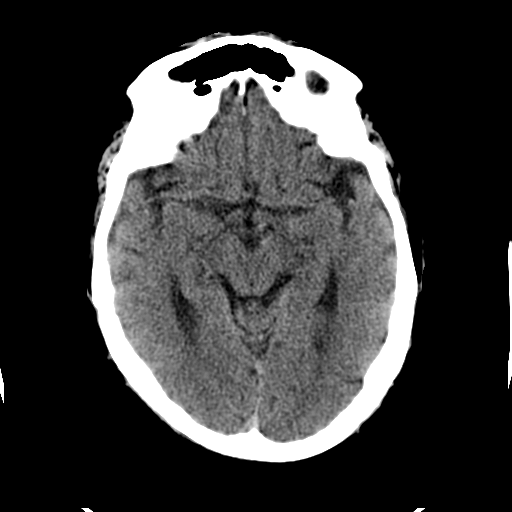
[im 15/36  brain]
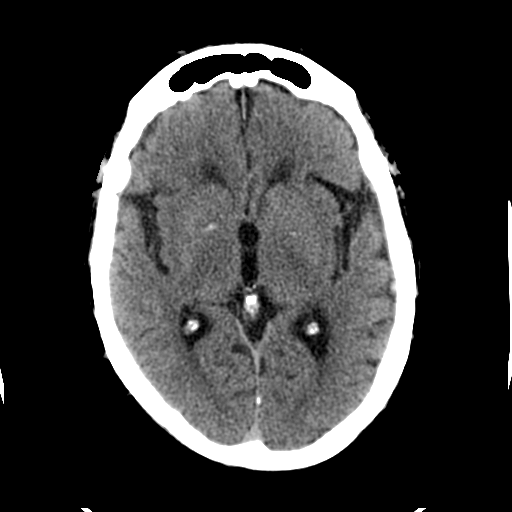
[im 17/36  brain]
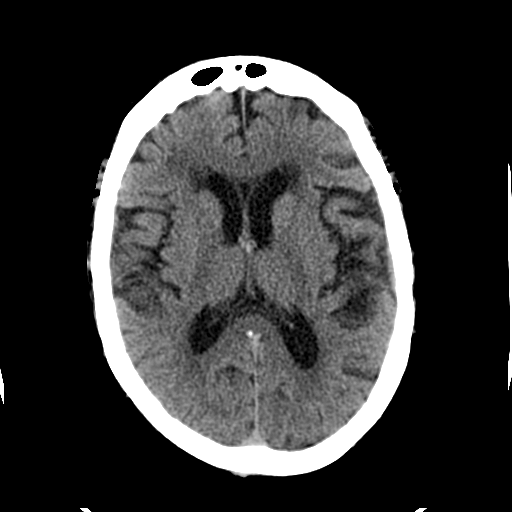
[im 19/36  brain]
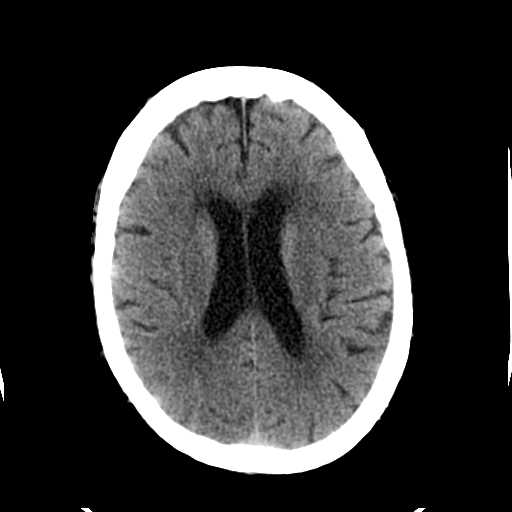
[im 19/36  bone]
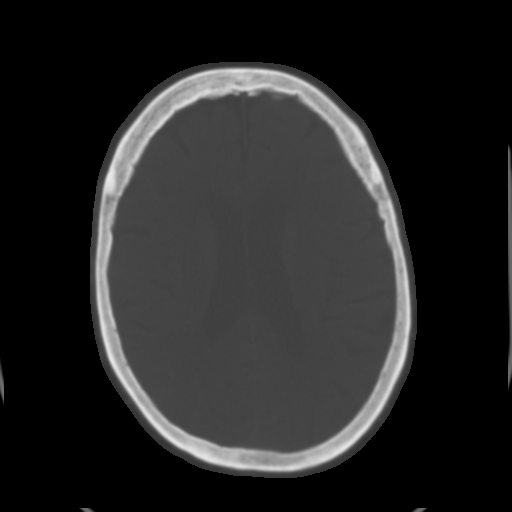
[im 21/36  brain]
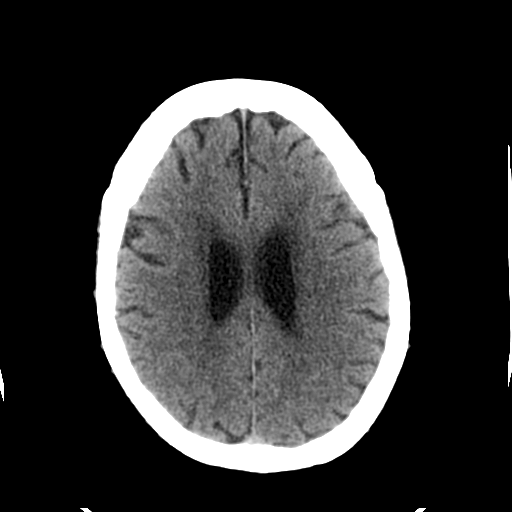
[im 23/36  brain]
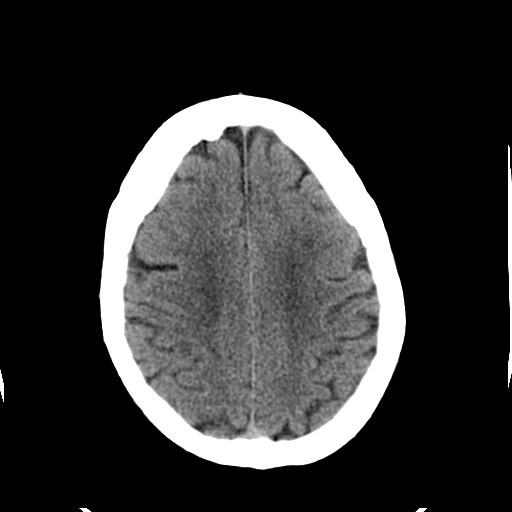
[im 26/36  brain]
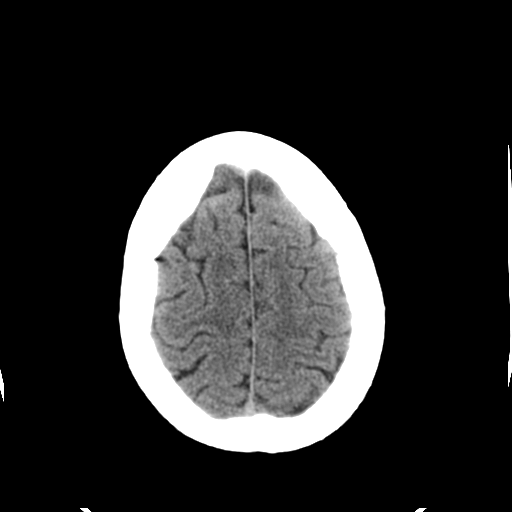
[im 27/36  brain]
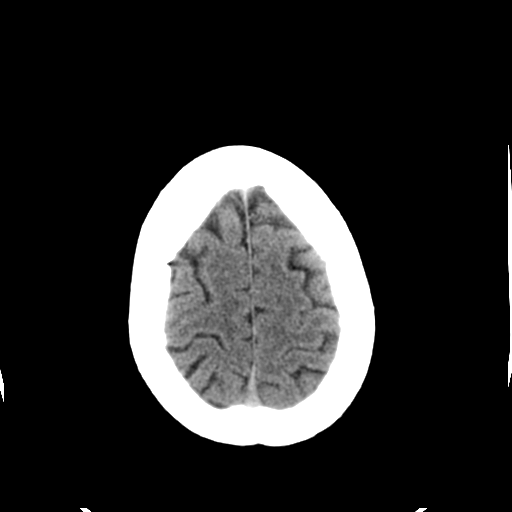
[im 27/36  bone]
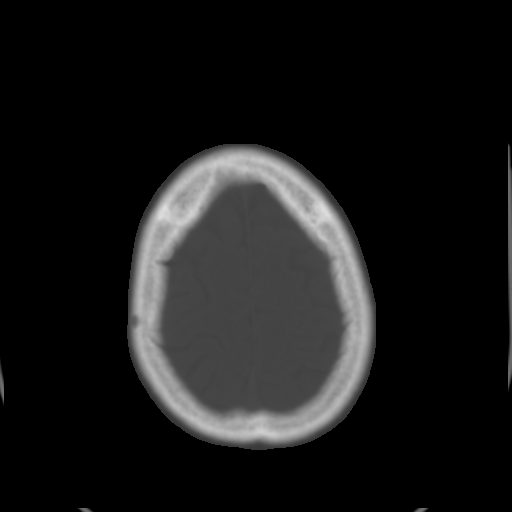
[im 29/36  brain]
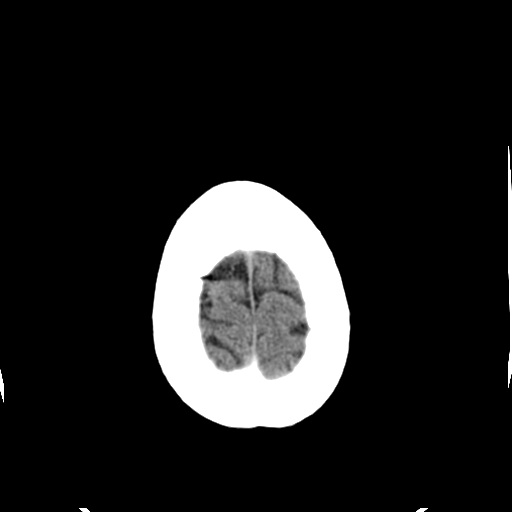
[im 32/36  brain]
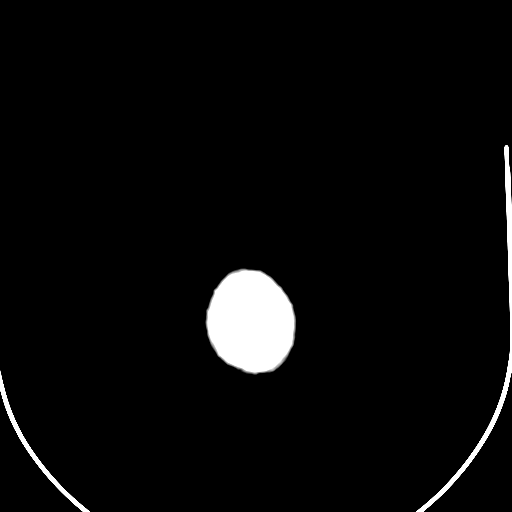
[im 34/36  brain]
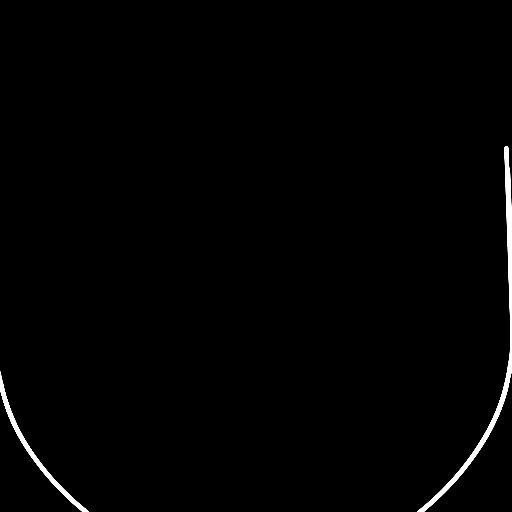

[16 of 30 positions shown; findings below may reference images not displayed]

FINDINGS: Generalized atrophy.
Normal ventricular morphology.
No midline shift or mass effect.
Small vessel chronic ischemic changes of deep cerebral white
matter.
No intracranial hemorrhage, mass lesion, or acute infarction.
Scattered mucosal thickening ethmoid air cells particularly right.
Visualized paranasal sinuses and mastoid air cells otherwise clear.
Bones unremarkable.
IMPRESSION: Atrophy with small vessel chronic ischemic changes of deep cerebral
white matter.
No acute intracranial abnormalities.

## 2014-07-22 ENCOUNTER — Ambulatory Visit (INDEPENDENT_AMBULATORY_CARE_PROVIDER_SITE_OTHER): Payer: Medicare Other | Admitting: Urology

## 2014-07-22 DIAGNOSIS — N3020 Other chronic cystitis without hematuria: Secondary | ICD-10-CM

## 2014-07-22 DIAGNOSIS — R300 Dysuria: Secondary | ICD-10-CM

## 2014-07-22 DIAGNOSIS — N949 Unspecified condition associated with female genital organs and menstrual cycle: Secondary | ICD-10-CM

## 2014-07-24 IMAGING — CR DG CHEST 2V
2 series · 2 of 2 positions shown · non-contrast
Comparison: 08/28/2011

CLINICAL DATA: Cough, congestion, possible pneumonia

CHEST - 2 VIEW

[view not recorded (1 of 2)]
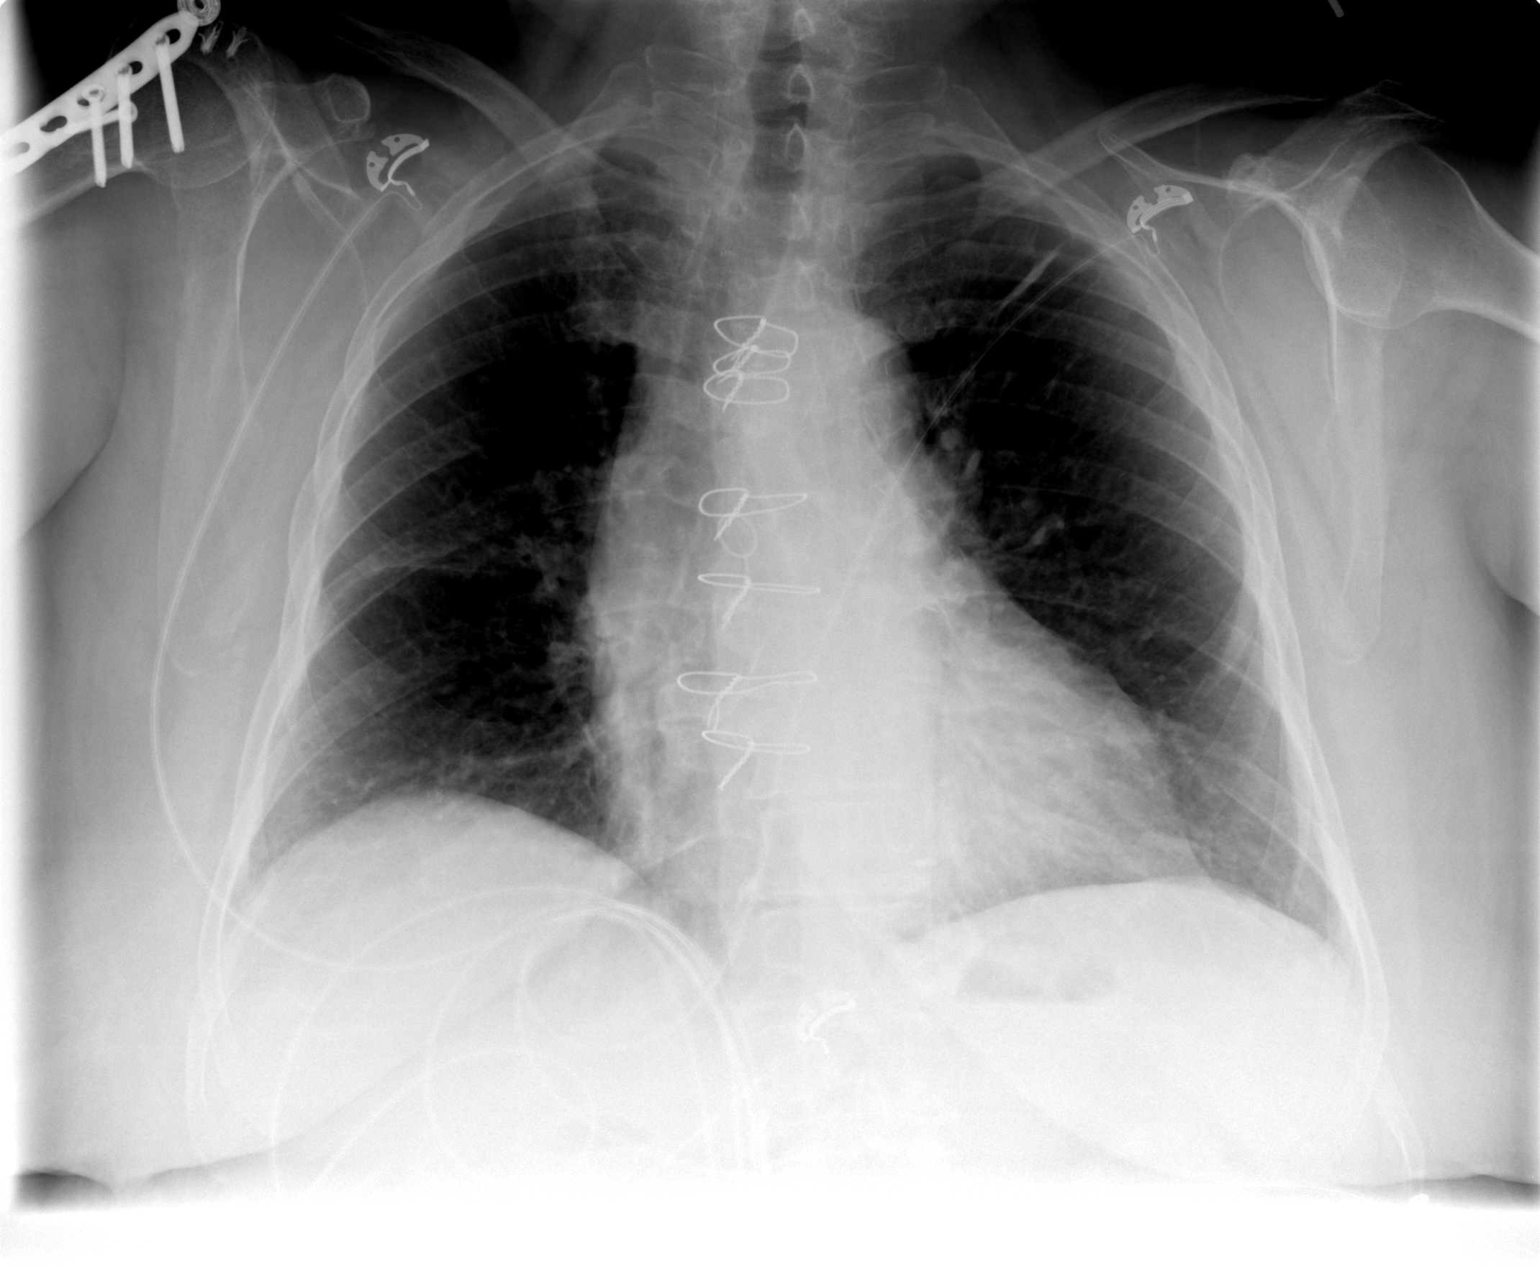

[view not recorded (2 of 2)]
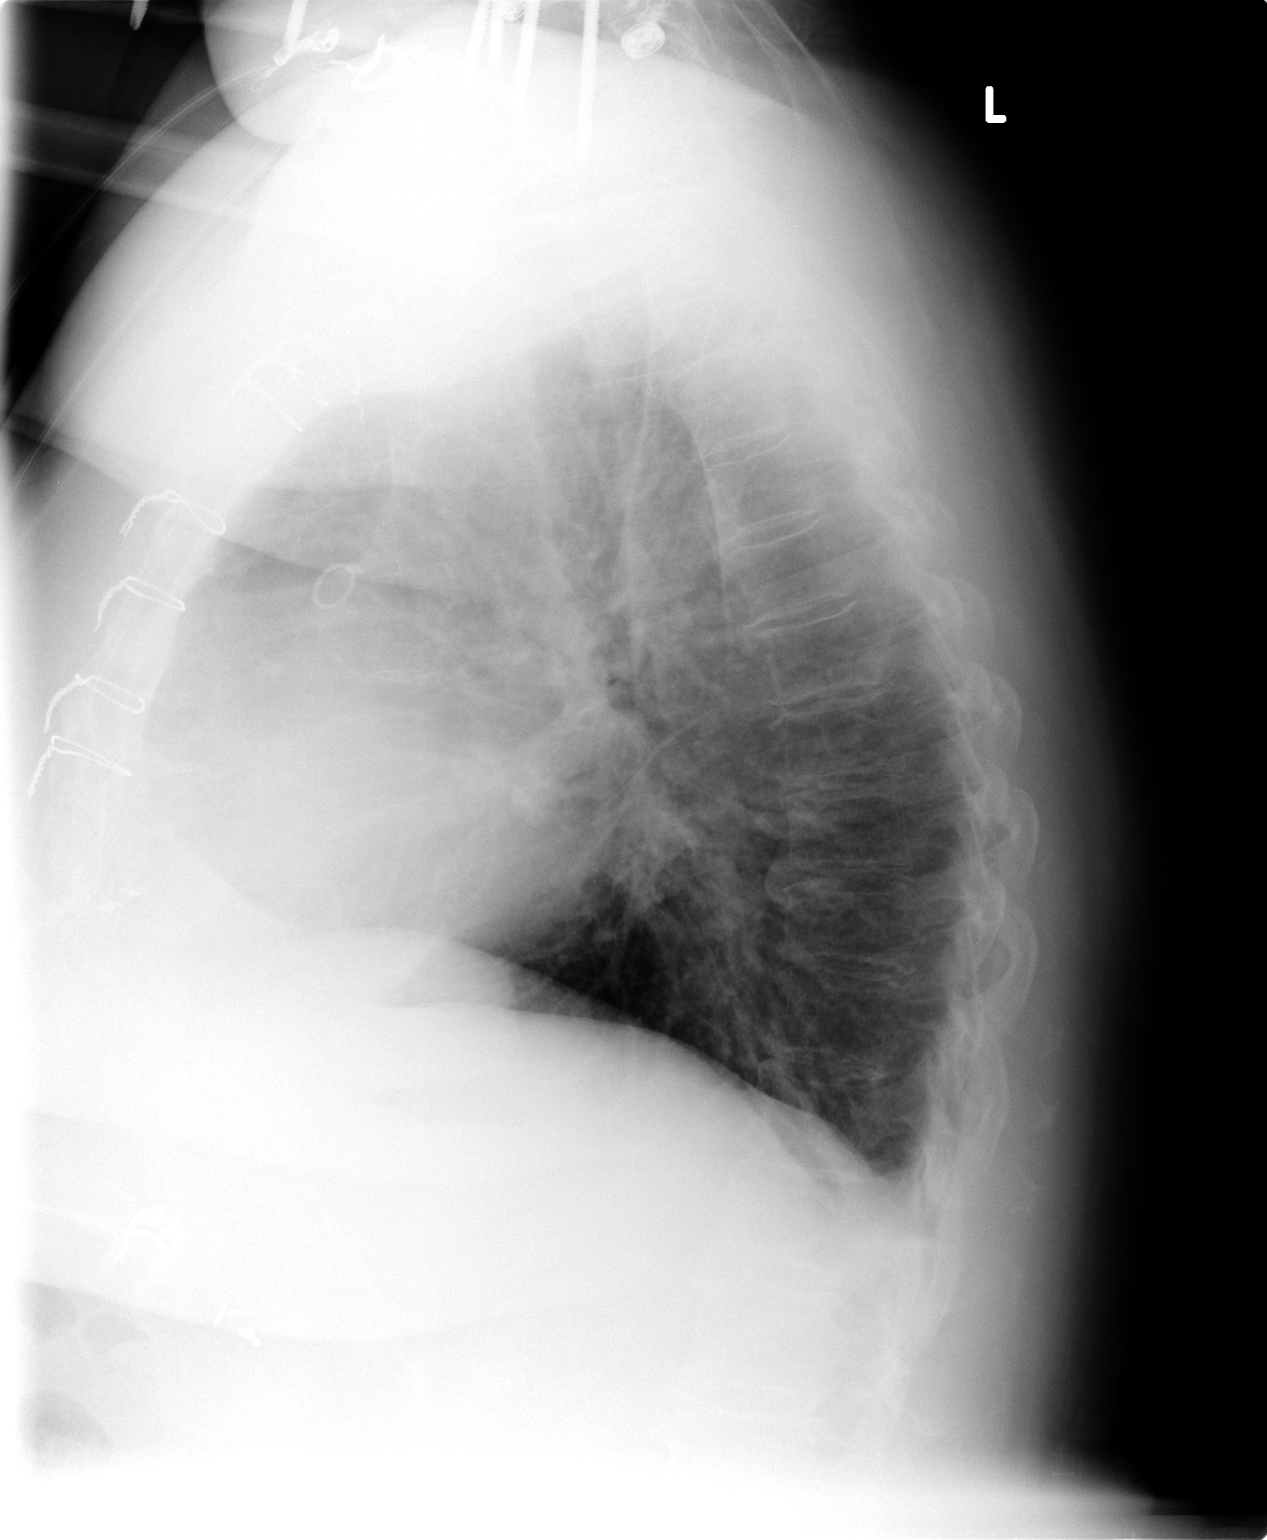

[2 of 2 positions shown; findings below may reference images not displayed]

FINDINGS: Cardiomediastinal silhouette is stable.  Status post CABG
again noted.  No acute infiltrate or pulmonary edema.  Stable
scarring in the left upper lobes.  Stable mild degenerative changes
thoracic spine.
IMPRESSION: No active disease.  Status post CABG.  Stable scarring in the left
upper lobe.

## 2014-07-26 ENCOUNTER — Other Ambulatory Visit: Payer: Self-pay | Admitting: Urology

## 2014-08-03 ENCOUNTER — Encounter (HOSPITAL_BASED_OUTPATIENT_CLINIC_OR_DEPARTMENT_OTHER): Payer: Self-pay | Admitting: *Deleted

## 2014-08-04 ENCOUNTER — Encounter (HOSPITAL_BASED_OUTPATIENT_CLINIC_OR_DEPARTMENT_OTHER): Payer: Self-pay | Admitting: *Deleted

## 2014-08-05 ENCOUNTER — Encounter (HOSPITAL_BASED_OUTPATIENT_CLINIC_OR_DEPARTMENT_OTHER): Payer: Self-pay | Admitting: *Deleted

## 2014-08-05 NOTE — Progress Notes (Signed)
NPO AFTER MN. ARRIVE AT 0715. NEEDS ISTAT.  CURRENT EKG IN CHART AND EPIC. WILL TAKE SYNTHROID AM DOS W/ SIPS OF WATER.

## 2014-08-08 NOTE — H&P (Signed)
Active Problems  1. Atrophic vaginitis (627.3)  2. Chronic cystitis (595.2)  3. Dysuria (788.1)  4. Incomplete bladder emptying (788.21)  5. Overactive bladder (596.51)  6. Rectocele, female (618.04)  7. Routine history and physical examination of adult (V70.0)  8. Urge incontinence of urine (788.31)  9. Vaginal candidiasis (112.1)  10. Vaginitis (616.10)  History of Present Illness   [name] [name] returns today in f/u.  She was recently been seen by Dr. Sherron Monday at my request for her refractory incontinence.  he did cystoscopy and found several erythematous lesions in the bladder.  She had a cytology in 2014 that was negative.   I scoped her last year and thought she had inflammation.  She continues to have dysuria and vaginal burning.  She continues to have severe UUI and nocturia 4-5x nightly with frequency q1-2hrs but she generally wets before she gets there.   She has a history of chronic cystitis.  She has had no hematuria or fever.  She has frequency q2hrs and nocturia q1hr.  She wears pull ups.   She a history of a 600cc unstable bladder with UUI but no SUI.   She has elevated PVR's of 220 to 440cc.   Her culture on 12/10/13 grew >100000 yeast and a f/u in January was negative. She remains on Estrace cream.   She had stopped responding to Gala Murdoch so she stopped that.   She remains on nitrofurantoin.   Past Medical History  1. History of Acute Myocardial Infarction (V12.59)  2. History of Arthritis (V13.4)  3. History of cardiac disorder (V12.50)  4. History of carpal tunnel syndrome (V12.49)  5. History of diabetes mellitus (V12.29)  6. History of hypercholesterolemia (V12.29)  7. History of hypertension (V12.59)  8. History of hypothyroidism (V12.29)  9. History of sleep apnea (V13.89)  10. History of sleep apnea (V13.89)  11. History of Incomplete bladder emptying (788.21)  12. History of Stroke syndrome (434.91)  13. History of Stroke syndrome (434.91)  Surgical History  1. History of Appendectomy  2. History of Bladder Surgery  3. History of CABG (CABG)  4. History of Cholecystectomy  5. History of Hand Surgery  6. History of Hand Surgery  7. History of Hand Surgery  8. History of Heart Surgery  9. History of Hysterectomy  10. History of Rotator Cuff Repair  11. History of Shoulder Surgery  12. History of Wrist Surgery  Current Meds  1. AmLODIPine Besylate 5 MG Oral Tablet;  Therapy: (Recorded:10Jun2013) to Recorded  2. Apidra SOLN;  Therapy: (Recorded:10Jun2013) to Recorded  3. Enalapril Maleate TABS;  Therapy: (Recorded:06May2011) to Recorded  4. Estrace 0.1 MG/GM Vaginal Cream; 1/2 applicatorful at bedtime 3 times weekly;  Therapy: 10Jun2013 to (Last Rx:15Aug2014)  Requested for: 15Aug2014 Ordered  5. HumaLOG SOLN;  Therapy: (Recorded:09Jun2015) to Recorded  6. Levemir SOLN;  Therapy: (Recorded:09Jun2015) to Recorded  7. Levothyroxine Sodium 25 MCG Oral Tablet;  Therapy: (Recorded:10Jun2013) to Recorded  8. Lisinopril-Hydrochlorothiazide 20-12.5 MG Oral Tablet;  Therapy: (Recorded:10Jun2013) to Recorded  9. Meloxicam 15 MG Oral Tablet; Take 1 tablet daily;  Therapy: 09Jun2015 to (Evaluate:07Sep2015); Last Rx:09Jun2015 Ordered  10. MetFORMIN HCl - 500 MG Oral Tablet;   Therapy: (Recorded:10Jun2013) to Recorded  11. Nitrofurantoin Macrocrystal 100 MG Oral Capsule; TAKE 1 CAPSULE Daily;   Therapy: 09Jun2015 to (Evaluate:03Jun2016); Last Rx:09Jun2015 Ordered  12. Toviaz 8 MG Oral Tablet Extended Release 24 Hour; TAKE 1 TABLET DAILY;   Therapy: 13Mar2015 to (Evaluate:10Apr2015); Last Rx:13Mar2015 Ordered  Allergies  1.  Codeine Derivatives  2. Codeine Derivatives  Family History  1. Family history of Breast Cancer (V16.3) : Daughter  2. Family history of Death In The Family Mother  3. Family history of Death In The Family Mother  4. Family history of Diabetes Mellitus (V18.0) : Daughter  5. Family history of Diabetes Mellitus (V18.0) :  Maternal Grandmother  6. Family history of Family Health Status Children ___ Living Daughters  7. Family history of Family Health Status Children ___ Living Sons  8. Family history of Family Health Status Number Of Children  9. Family history of Hypertension (V17.49) : Father  10. Family history of Hypertension (V17.49) : Mother  2411. Family history of Hypertension (V17.49) : Daughter  3212. Family history of Kidney Cancer (V16.51) : Son  Social History  1. Denied: History of Alcohol Use  2. Caffeine Use   2 cups of coffee/day and 1 soda/day  3. Marital History - Currently Married  4. Never A Smoker  5. Occupation:   retired  6. Denied: History of Tobacco Use (305.1)  Review of Systems  Genitourinary: urinary frequency, dysuria, nocturia, incontinence, incomplete emptying of bladder and pelvic pain, but no hematuria.  Gastrointestinal: diarrhea, but no constipation.  Constitutional: no recent weight loss.  Cardiovascular: no chest pain.  Respiratory: no shortness of breath.    Vitals Vital Signs [Data Includes: Last 1 Day]  Recorded: 31Jul2015 03:23PM  Blood Pressure: 161 / 73 Temperature: 98.4 F Heart Rate: 79  Physical Exam Constitutional: Well nourished and well developed . No acute distress.  Pulmonary: No respiratory distress and normal respiratory rhythm and effort.  Cardiovascular: Heart rate and rhythm are normal . No peripheral edema.    Results/Data Urine [Data Includes: Last 1 Day]   31Jul2015  COLOR STRAW   APPEARANCE CLOUDY   SPECIFIC GRAVITY 1.010   pH 5.0   GLUCOSE > 1000 mg/dL  BILIRUBIN NEG   KETONE NEG mg/dL  BLOOD MOD   PROTEIN NEG mg/dL  UROBILINOGEN 0.2 mg/dL  NITRITE NEG   LEUKOCYTE ESTERASE TRACE   SQUAMOUS EPITHELIAL/HPF RARE   WBC 3-6 WBC/hpf  RBC 7-10 RBC/hpf  BACTERIA RARE   CRYSTALS NONE SEEN   CASTS NONE SEEN   Other SN    Old records or history reviewed: I have reviewed the notes from Dr. Sherron MondayMacDiarmid.  The following clinical  lab reports were reviewed:  UA reviewed.    Assessment  1. Chronic cystitis (595.2)  2. Urge incontinence of urine (788.31)  3. Atrophic vaginitis (627.3)   She was found to have erythematous lesions on her recent cystoscopy it was felt that Cystoscopy and HOD might be off value. She has microhematuria today as well.   Plan Chronic cystitis   1. URINE CULTURE; Status:Hold For - Specimen/Data Collection,Appointment; Requested  for:31Jul2015;   2. Follow-up Schedule Surgery Office  Follow-up  Status: Hold For - Appointment   Requested for: 31Jul2015 Urge incontinence of urine   3. Stop: Toviaz 8 MG Oral Tablet Extended Release 24 Hour  4. UA With REFLEX; [Do Not Release]; Status:Resulted - Requires Verification;   Done:  31Jul2015 03:39PM   I am going to get her set up for cystoscopy with Bil RTG's,  HOD and bladder biopsy with fulguration and instillation of pyridium and marcaine.   I have reviewed the risks of bleeding, infection, bladder injury, thrombotic events and anesthetic complications.   Discussion/Summary  CC: Dr. Butch PennyAngus McInnis.

## 2014-08-09 ENCOUNTER — Encounter (HOSPITAL_BASED_OUTPATIENT_CLINIC_OR_DEPARTMENT_OTHER): Payer: Self-pay | Admitting: *Deleted

## 2014-08-09 ENCOUNTER — Ambulatory Visit (HOSPITAL_BASED_OUTPATIENT_CLINIC_OR_DEPARTMENT_OTHER)
Admission: RE | Admit: 2014-08-09 | Discharge: 2014-08-09 | Disposition: A | Discharge status: Home / Self Care | Payer: Medicare Other | Source: Ambulatory Visit | Attending: Urology | Admitting: Urology

## 2014-08-09 ENCOUNTER — Encounter (HOSPITAL_BASED_OUTPATIENT_CLINIC_OR_DEPARTMENT_OTHER)
Admission: RE | Disposition: A | Discharge status: Home / Self Care | Payer: Self-pay | Source: Ambulatory Visit | Attending: Urology

## 2014-08-09 ENCOUNTER — Encounter (HOSPITAL_BASED_OUTPATIENT_CLINIC_OR_DEPARTMENT_OTHER): Payer: Medicare Other | Admitting: Anesthesiology

## 2014-08-09 ENCOUNTER — Ambulatory Visit (HOSPITAL_BASED_OUTPATIENT_CLINIC_OR_DEPARTMENT_OTHER): Payer: Medicare Other | Admitting: Anesthesiology

## 2014-08-09 DIAGNOSIS — J449 Chronic obstructive pulmonary disease, unspecified: Secondary | ICD-10-CM | POA: Insufficient documentation

## 2014-08-09 DIAGNOSIS — Z8673 Personal history of transient ischemic attack (TIA), and cerebral infarction without residual deficits: Secondary | ICD-10-CM | POA: Insufficient documentation

## 2014-08-09 DIAGNOSIS — Z951 Presence of aortocoronary bypass graft: Secondary | ICD-10-CM | POA: Diagnosis not present

## 2014-08-09 DIAGNOSIS — N3941 Urge incontinence: Secondary | ICD-10-CM | POA: Insufficient documentation

## 2014-08-09 DIAGNOSIS — I10 Essential (primary) hypertension: Secondary | ICD-10-CM | POA: Insufficient documentation

## 2014-08-09 DIAGNOSIS — I252 Old myocardial infarction: Secondary | ICD-10-CM | POA: Diagnosis not present

## 2014-08-09 DIAGNOSIS — R319 Hematuria, unspecified: Secondary | ICD-10-CM | POA: Diagnosis not present

## 2014-08-09 DIAGNOSIS — E039 Hypothyroidism, unspecified: Secondary | ICD-10-CM | POA: Diagnosis not present

## 2014-08-09 DIAGNOSIS — G4730 Sleep apnea, unspecified: Secondary | ICD-10-CM | POA: Insufficient documentation

## 2014-08-09 DIAGNOSIS — G609 Hereditary and idiopathic neuropathy, unspecified: Secondary | ICD-10-CM | POA: Diagnosis not present

## 2014-08-09 DIAGNOSIS — N3010 Interstitial cystitis (chronic) without hematuria: Secondary | ICD-10-CM | POA: Diagnosis not present

## 2014-08-09 DIAGNOSIS — I2510 Atherosclerotic heart disease of native coronary artery without angina pectoris: Secondary | ICD-10-CM | POA: Insufficient documentation

## 2014-08-09 DIAGNOSIS — E119 Type 2 diabetes mellitus without complications: Secondary | ICD-10-CM | POA: Diagnosis not present

## 2014-08-09 DIAGNOSIS — Z79899 Other long term (current) drug therapy: Secondary | ICD-10-CM | POA: Insufficient documentation

## 2014-08-09 DIAGNOSIS — N329 Bladder disorder, unspecified: Secondary | ICD-10-CM | POA: Diagnosis present

## 2014-08-09 DIAGNOSIS — N952 Postmenopausal atrophic vaginitis: Secondary | ICD-10-CM | POA: Insufficient documentation

## 2014-08-09 DIAGNOSIS — J4489 Other specified chronic obstructive pulmonary disease: Secondary | ICD-10-CM | POA: Insufficient documentation

## 2014-08-09 DIAGNOSIS — Z91199 Patient's noncompliance with other medical treatment and regimen due to unspecified reason: Secondary | ICD-10-CM | POA: Insufficient documentation

## 2014-08-09 DIAGNOSIS — Z9119 Patient's noncompliance with other medical treatment and regimen: Secondary | ICD-10-CM | POA: Insufficient documentation

## 2014-08-09 DIAGNOSIS — N949 Unspecified condition associated with female genital organs and menstrual cycle: Secondary | ICD-10-CM | POA: Diagnosis present

## 2014-08-09 HISTORY — DX: Type 2 diabetes mellitus without complications: Z79.4

## 2014-08-09 HISTORY — DX: Stress incontinence (female) (male): N39.3

## 2014-08-09 HISTORY — DX: Type 2 diabetes mellitus without complications: E11.9

## 2014-08-09 HISTORY — DX: Unspecified symptoms and signs involving the genitourinary system: R39.9

## 2014-08-09 HISTORY — DX: Unspecified osteoarthritis, unspecified site: M19.90

## 2014-08-09 HISTORY — DX: Unspecified diastolic (congestive) heart failure: I50.30

## 2014-08-09 HISTORY — DX: Obstructive sleep apnea (adult) (pediatric): G47.33

## 2014-08-09 HISTORY — DX: Hematuria, unspecified: R31.9

## 2014-08-09 HISTORY — DX: Personal history of transient ischemic attack (TIA), and cerebral infarction without residual deficits: Z86.73

## 2014-08-09 HISTORY — DX: Psoriasis, unspecified: L40.9

## 2014-08-09 HISTORY — PX: CYSTOSCOPY W/ RETROGRADES: SHX1426

## 2014-08-09 HISTORY — DX: Hyperlipidemia, unspecified: E78.5

## 2014-08-09 HISTORY — DX: Old myocardial infarction: I25.2

## 2014-08-09 HISTORY — DX: Bladder disorder, unspecified: N32.9

## 2014-08-09 LAB — POCT I-STAT 4, (NA,K, GLUC, HGB,HCT)
Glucose, Bld: 142 mg/dL — ABNORMAL HIGH (ref 70–99)
HCT: 37 % (ref 36.0–46.0)
Hemoglobin: 12.6 g/dL (ref 12.0–15.0)
Potassium: 4.3 mEq/L (ref 3.7–5.3)
Sodium: 142 mEq/L (ref 137–147)

## 2014-08-09 LAB — GLUCOSE, CAPILLARY: Glucose-Capillary: 181 mg/dL — ABNORMAL HIGH (ref 70–99)

## 2014-08-09 SURGERY — CYSTOSCOPY, WITH RETROGRADE PYELOGRAM
Anesthesia: General | Site: Bladder | Laterality: Bilateral

## 2014-08-09 MED ORDER — STERILE WATER FOR IRRIGATION IR SOLN
Status: DC | PRN
  Administered 2014-08-09: 3000 mL

## 2014-08-09 MED ORDER — IOHEXOL 350 MG/ML SOLN
INTRAVENOUS | Status: DC | PRN
  Administered 2014-08-09: 14 mL

## 2014-08-09 MED ORDER — LACTATED RINGERS IV SOLN
INTRAVENOUS | Status: DC
  Administered 2014-08-09: 08:00:00 via INTRAVENOUS
  Filled 2014-08-09: qty 1000

## 2014-08-09 MED ORDER — OXYCODONE HCL 5 MG PO TABS
5.0000 mg | ORAL_TABLET | Freq: Once | ORAL | Status: DC | PRN
  Filled 2014-08-09: qty 1

## 2014-08-09 MED ORDER — PROMETHAZINE HCL 25 MG/ML IJ SOLN
6.2500 mg | INTRAMUSCULAR | Status: DC | PRN
  Filled 2014-08-09: qty 1

## 2014-08-09 MED ORDER — FENTANYL CITRATE 0.05 MG/ML IJ SOLN
INTRAMUSCULAR | Status: AC
  Filled 2014-08-09: qty 6

## 2014-08-09 MED ORDER — PHENAZOPYRIDINE HCL 200 MG PO TABS: 200.0000 mg | ORAL_TABLET | Freq: Three times a day (TID) | ORAL | Status: DC | PRN

## 2014-08-09 MED ORDER — PHENAZOPYRIDINE HCL 200 MG PO TABS
ORAL | Status: DC | PRN
  Administered 2014-08-09: 09:00:00 via INTRAVESICAL

## 2014-08-09 MED ORDER — FENTANYL CITRATE 0.05 MG/ML IJ SOLN
INTRAMUSCULAR | Status: DC | PRN
  Administered 2014-08-09 (×2): 25 ug via INTRAVENOUS
  Administered 2014-08-09 (×2): 50 ug via INTRAVENOUS
  Administered 2014-08-09 (×2): 25 ug via INTRAVENOUS

## 2014-08-09 MED ORDER — TRAMADOL HCL 50 MG PO TABS
ORAL_TABLET | ORAL | Status: AC
  Filled 2014-08-09: qty 1

## 2014-08-09 MED ORDER — FENTANYL CITRATE 0.05 MG/ML IJ SOLN
25.0000 ug | INTRAMUSCULAR | Status: DC | PRN
Start: 2014-08-09 — End: 2014-08-09
  Administered 2014-08-09 (×2): 50 ug via INTRAVENOUS
  Filled 2014-08-09: qty 1

## 2014-08-09 MED ORDER — PROPOFOL 10 MG/ML IV BOLUS
INTRAVENOUS | Status: DC | PRN
  Administered 2014-08-09: 150 mg via INTRAVENOUS

## 2014-08-09 MED ORDER — FENTANYL CITRATE 0.05 MG/ML IJ SOLN
INTRAMUSCULAR | Status: AC
  Filled 2014-08-09: qty 2

## 2014-08-09 MED ORDER — ONDANSETRON HCL 4 MG/2ML IJ SOLN
INTRAMUSCULAR | Status: DC | PRN
  Administered 2014-08-09: 4 mg via INTRAVENOUS

## 2014-08-09 MED ORDER — METOCLOPRAMIDE HCL 5 MG/ML IJ SOLN
INTRAMUSCULAR | Status: DC | PRN
  Administered 2014-08-09: 10 mg via INTRAVENOUS

## 2014-08-09 MED ORDER — LIDOCAINE HCL (CARDIAC) 20 MG/ML IV SOLN
INTRAVENOUS | Status: DC | PRN
  Administered 2014-08-09: 60 mg via INTRAVENOUS

## 2014-08-09 MED ORDER — TRAMADOL HCL 50 MG PO TABS: 50.0000 mg | ORAL_TABLET | Freq: Four times a day (QID) | ORAL | Status: DC | PRN

## 2014-08-09 MED ORDER — OXYCODONE HCL 5 MG/5ML PO SOLN
5.0000 mg | Freq: Once | ORAL | Status: DC | PRN
Start: 2014-08-09 — End: 2014-08-09
  Filled 2014-08-09: qty 5

## 2014-08-09 MED ORDER — EPHEDRINE SULFATE 50 MG/ML IJ SOLN
INTRAMUSCULAR | Status: DC | PRN
  Administered 2014-08-09: 10 mg via INTRAVENOUS

## 2014-08-09 MED ORDER — CIPROFLOXACIN IN D5W 400 MG/200ML IV SOLN
400.0000 mg | INTRAVENOUS | Status: AC
  Administered 2014-08-09: 400 mg via INTRAVENOUS
  Filled 2014-08-09: qty 200

## 2014-08-09 MED ORDER — TRAMADOL HCL 50 MG PO TABS
50.0000 mg | ORAL_TABLET | Freq: Once | ORAL | Status: AC
  Administered 2014-08-09: 50 mg via ORAL
  Filled 2014-08-09: qty 1

## 2014-08-09 MED ORDER — CIPROFLOXACIN HCL 250 MG PO TABS: 250.0000 mg | ORAL_TABLET | Freq: Two times a day (BID) | ORAL | Status: AC

## 2014-08-09 SURGICAL SUPPLY — 19 items
BAG DRAIN URO-CYSTO SKYTR STRL (DRAIN) ×3 IMPLANT
BAG DRN UROCATH (DRAIN) ×1
CANISTER SUCT LVC 12 LTR MEDI- (MISCELLANEOUS) ×2 IMPLANT
CATH ROBINSON RED A/P 14FR (CATHETERS) ×2 IMPLANT
CATH URET 5FR 28IN OPEN ENDED (CATHETERS) ×2 IMPLANT
CLOTH BEACON ORANGE TIMEOUT ST (SAFETY) ×3 IMPLANT
DRAPE CAMERA CLOSED 9X96 (DRAPES) ×3 IMPLANT
GLOVE BIO SURGEON STRL SZ 6.5 (GLOVE) ×1 IMPLANT
GLOVE BIO SURGEONS STRL SZ 6.5 (GLOVE) ×1
GLOVE BIOGEL PI IND STRL 6.5 (GLOVE) IMPLANT
GLOVE BIOGEL PI INDICATOR 6.5 (GLOVE) ×4
GLOVE SURG SS PI 8.0 STRL IVOR (GLOVE) ×3 IMPLANT
GOWN STRL REUS W/ TWL LRG LVL3 (GOWN DISPOSABLE) IMPLANT
GOWN STRL REUS W/TWL LRG LVL3 (GOWN DISPOSABLE) ×5 IMPLANT
GUIDEWIRE STR DUAL SENSOR (WIRE) ×3 IMPLANT
NDL BLUNT 18X1 FOR OR ONLY (NEEDLE) IMPLANT
NEEDLE BLUNT 18X1 FOR OR ONLY (NEEDLE) ×3 IMPLANT
PACK CYSTOSCOPY (CUSTOM PROCEDURE TRAY) ×3 IMPLANT
SYRINGE 20CC LL (MISCELLANEOUS) ×2 IMPLANT

## 2014-08-09 NOTE — Anesthesia Postprocedure Evaluation (Signed)
  Anesthesia Post-op Note  Patient: [name] [name]  Procedure(s) Performed: Procedure(s): CYSTOSCOPY, BILATERAL RETROGRADE, HYDRODISTENSION, BLADDER BIOPSY WITH FULGERATION, INSTILL PYRIDIUM AND MARCAINE  (Bilateral)  Patient Location: PACU  Anesthesia Type:General  Level of Consciousness: awake, alert  and oriented  Airway and Oxygen Therapy: Patient Spontanous Breathing  Post-op Pain: mild  Post-op Assessment: Post-op Vital signs reviewed  Post-op Vital Signs: Reviewed  Last Vitals:  Filed Vitals:   08/09/14 1030  BP: 130/92  Pulse: 65  Temp:   Resp: 17    Complications: No apparent anesthesia complications

## 2014-08-09 NOTE — Anesthesia Preprocedure Evaluation (Addendum)
Anesthesia Evaluation  Patient identified by MRN, date of birth, ID band Patient awake    Reviewed: Allergy & Precautions, NPO status , Patient's Chart, lab work & pertinent test results  Airway Mallampati: III TM Distance: <3 FB Neck ROM: Full    Dental  (+) Teeth Intact, Dental Advisory Given   Pulmonary sleep apnea (Noncompliant with CPAP) , COPD breath sounds clear to auscultation        Cardiovascular hypertension, + CAD, + Past MI and + CABG Rhythm:Regular Rate:Normal  EF 60-65%. Mild LVH, Diastolic dysfunction   Neuro/Psych TIA Neuromuscular disease (Peripheral neuropathy) negative psych ROS   GI/Hepatic negative GI ROS, Neg liver ROS,   Endo/Other  diabetes, Type 2, Insulin DependentHypothyroidism Morbid obesity  Renal/GU negative Renal ROS     Musculoskeletal negative musculoskeletal ROS (+)   Abdominal   Peds  Hematology negative hematology ROS (+)   Anesthesia Other Findings   Reproductive/Obstetrics negative OB ROS                          Anesthesia Physical Anesthesia Plan  ASA: III  Anesthesia Plan: General   Post-op Pain Management:    Induction: Intravenous  Airway Management Planned: LMA  Additional Equipment: None  Intra-op Plan:   Post-operative Plan: Extubation in OR  Informed Consent: I have reviewed the patients History and Physical, chart, labs and discussed the procedure including the risks, benefits and alternatives for the proposed anesthesia with the patient or authorized representative who has indicated his/her understanding and acceptance.   Dental advisory given  Plan Discussed with: CRNA  Anesthesia Plan Comments:         Anesthesia Quick Evaluation

## 2014-08-09 NOTE — Transfer of Care (Signed)
Immediate Anesthesia Transfer of Care Note  Patient: [name]  Procedure(s) Performed: Procedure(s) (LRB): CYSTOSCOPY, BILATERAL RETROGRADE, HYDRODISTENSION, BLADDER BIOPSY WITH FULGERATION, INSTILL PYRIDIUM AND MARCAINE  (Bilateral)  Patient Location: PACU  Anesthesia Type: General  Level of Consciousness: awake, oriented, sedated and patient cooperative  Airway & Oxygen Therapy: Patient Spontanous Breathing and Patient connected to face mask oxygen  Post-op Assessment: Report given to PACU RN and Post -op Vital signs reviewed and stable  Post vital signs: Reviewed and stable  Complications: No apparent anesthesia complications

## 2014-08-09 NOTE — Anesthesia Procedure Notes (Signed)
Procedure Name: LMA Insertion Date/Time: 08/09/2014 9:10 AM Performed by: Renella CunasHAZEL, Marnell Mcdaniel D Pre-anesthesia Checklist: Patient identified, Emergency Drugs available, Suction available and Patient being monitored Patient Re-evaluated:Patient Re-evaluated prior to inductionOxygen Delivery Method: Circle System Utilized Preoxygenation: Pre-oxygenation with 100% oxygen Intubation Type: IV induction Ventilation: Mask ventilation without difficulty LMA: LMA inserted LMA Size: 4.0 Number of attempts: 1 Airway Equipment and Method: bite block Placement Confirmation: positive ETCO2 Tube secured with: Tape Dental Injury: Teeth and Oropharynx as per pre-operative assessment

## 2014-08-09 NOTE — Discharge Instructions (Addendum)
Cystoscopy, Care After °Refer to this sheet in the next few weeks. These instructions provide you with information on caring for yourself after your procedure. Your caregiver may also give you more specific instructions. Your treatment has been planned according to current medical practices, but problems sometimes occur. Call your caregiver if you have any problems or questions after your procedure. °HOME CARE INSTRUCTIONS  °Things you can do to ease any discomfort after your procedure include: °· Drinking enough water and fluids to keep your urine clear or pale yellow. °· Taking a warm bath to relieve any burning feelings. °SEEK IMMEDIATE MEDICAL CARE IF:  °· You have an increase in blood in your urine. °· You notice blood clots in your urine. °· You have difficulty passing urine. °· You have the chills. °· You have abdominal pain. °· You have a fever or persistent symptoms for more than 2-3 days. °· You have a fever and your symptoms suddenly get worse. °MAKE SURE YOU:  °· Understand these instructions. °· Will watch your condition. °· Will get help right away if you are not doing well or get worse. °Document Released: 06/28/2005 Document Revised: 08/11/2013 Document Reviewed: 06/01/2012 °ExitCare® Patient Information ©2015 [employer]. This information is not intended to replace advice given to you by your health care provider. Make sure you discuss any questions you have with your health care provider. ° ° ° °Hydrodistention of the Bladder °Hydrodistention is a procedure to examine your bladder. During hydrodistention, your bladder is filled with fluid until it is more full than normal (distended). Your caregiver will use a thin tube with a camera on the end to examine your urethra and bladder (cystoscope). °LET YOUR CAREGIVER KNOW ABOUT: °· Any allergies you have. °· All medicines you are taking, including vitamins, herbs, eyedrops, and over-the-counter medicines and creams. °· Previous problems you or  members of your family have had with the use of anesthetics. °· Any blood disorders you have. °· Other health problems you have. °RISKS AND COMPLICATIONS °Generally, hydrodistention is a safe procedure. However, as with any surgical procedure, complications can occur. Possible complications associated with hydrodistention include: °· Bleeding. °· Infection. °· Bladder puncture. °· Difficulty urinating. °· Temporary inability to urinate. If this happens you may need a tube to drain urine from your bladder outside of your body (urinary catheter) for a short period after your procedure. °BEFORE THE PROCEDURE °· Do not eat or drink anything for at least 6 hours before your procedure. °· Make plans for someone to drive you home after the procedure. °PROCEDURE °Hydrodistention of the bladder is an outpatient procedure. You will not need to stay overnight. It may be done in a hospital or in a surgery clinic. It usually takes about 30 minutes.  °Small monitors will be placed on your body. They are used to check your heart rate, blood pressure level, and oxygen level during the procedure. An intravenous (IV) tube will be inserted into one of your veins. Fluids and medicine will flow directly into your body through the IV tube. You may be given medicine to make you sleep (general anesthetic) or you may be given medicine that makes you numb from the waist down (spinal anesthesia). °Your caregiver will gently glide the cystoscope into the tube through which urine flows out of your body (urethra) all the way to your bladder. Once in your bladder, fluid will flow through the cystoscope until your bladder is full. Your caregiver will measure the amount of fluid it takes   to fill your bladder. If your caregiver notices any abnormal tissue in your bladder, a tissue sample will be removed and sent to a lab to be examined (biopsy). When your caregiver has finished the exam, the fluid will be drained from your bladder and the cystoscope  will be removed. °AFTER THE PROCEDURE °You will stay in a recovery area until the anesthetic wears off. Your pulse and blood pressure will be frequently monitored until you are stable. Then you can go home. °Document Released: 09/02/2012 Document Reviewed: 09/02/2012 °ExitCare® Patient Information ©2015 [employer]. This information is not intended to replace advice given to you by your health care provider. Make sure you discuss any questions you have with your health care provider. ° ° ° ° °Post Anesthesia Home Care Instructions ° °Activity: °Get plenty of rest for the remainder of the day. A responsible adult should stay with you for 24 hours following the procedure.  °For the next 24 hours, DO NOT: °-Drive a car °-Operate machinery °-Drink alcoholic beverages °-Take any medication unless instructed by your physician °-Make any legal decisions or sign important papers. ° °Meals: °Start with liquid foods such as gelatin or soup. Progress to regular foods as tolerated. Avoid greasy, spicy, heavy foods. If nausea and/or vomiting occur, drink only clear liquids until the nausea and/or vomiting subsides. Call your physician if vomiting continues. ° °Special Instructions/Symptoms: °Your throat may feel dry or sore from the anesthesia or the breathing tube placed in your throat during surgery. If this causes discomfort, gargle with warm salt water. The discomfort should disappear within 24 hours. ° ° °

## 2014-08-09 NOTE — Interval H&P Note (Signed)
History and Physical Interval Note:  08/09/2014 8:43 AM  [name] [name]  has presented today for surgery, with the diagnosis of BLADDER LESION PELVIC PAIN AND HERMATURIA   The various methods of treatment have been discussed with the patient and family. After consideration of risks, benefits and other options for treatment, the patient has consented to  Procedure(s): CYSTOSCOPY BILATERAL RETROGRADE HYDRODISTENSION BLADDER BIOPSY WITH FULGERATION INSTILL PYRIDIUM AND MARCAINE  (Bilateral) as a surgical intervention .  The patient's history has been reviewed, patient examined, no change in status, stable for surgery.  I have reviewed the patient's chart and labs.  Questions were answered to the patient's satisfaction.     Tekeshia Klahr J

## 2014-08-09 NOTE — Brief Op Note (Signed)
08/09/2014  9:35 AM  PATIENT:  [name] [name]  78 y.o. female  PRE-OPERATIVE DIAGNOSIS:  BLADDER LESION PELVIC PAIN AND HEMATURIA   POST-OPERATIVE DIAGNOSIS:  IC with HUNNER'S ULCER, PELVIC PAIN AND HEMATURIA   PROCEDURE:  Procedure(s): CYSTOSCOPY, BILATERAL RETROGRADE, HYDRODISTENSION, BLADDER BIOPSY WITH FULGERATION, INSTILL PYRIDIUM AND MARCAINE  (Bilateral)  SURGEON:  Surgeon(s) and Role:    * Anner CreteJohn J Sender Rueb, MD - Primary  PHYSICIAN ASSISTANT:   ASSISTANTS: none   ANESTHESIA:   general  EBL:  Total I/O In: 200 [I.V.:200] Out: -   BLOOD ADMINISTERED:none  DRAINS: none   LOCAL MEDICATIONS USED:  MARCAINE     SPECIMEN:  Source of Specimen:  left bladder wall biopsies  DISPOSITION OF SPECIMEN:  PATHOLOGY  COUNTS:  YES  TOURNIQUET:  * No tourniquets in log *  DICTATION: .Other Dictation: Dictation Number [id number]   PLAN OF CARE: Discharge to home after PACU  PATIENT DISPOSITION:  PACU - hemodynamically stable.   Delay start of Pharmacological VTE agent (>24hrs) due to surgical blood loss or risk of bleeding: not applicable

## 2014-08-10 ENCOUNTER — Encounter (HOSPITAL_BASED_OUTPATIENT_CLINIC_OR_DEPARTMENT_OTHER): Payer: Self-pay | Admitting: Urology

## 2014-08-10 NOTE — Op Note (Signed)
NAME:  [name] [name], [name] [name]              ACCOUNT NO.:  [id number]  MEDICAL RECORD NO.:  [id number]  LOCATION:                                 FACILITY:  PHYSICIAN:  Excell SeltzerJohn J. [name] HowellsWrenn, M.D.    DATE OF BIRTH:  10-15-1936  DATE OF PROCEDURE:  08/09/2014 DATE OF DISCHARGE:  08/09/2014                              OPERATIVE REPORT   PROCEDURE:  Cystoscopy, bilateral retrograde pyelograms with interpretation, hydrodistention of the bladder, bladder biopsy with fulguration, instillation of Pyridium and Marcaine.  PREOPERATIVE DIAGNOSIS:  Pelvic pain and hematuria with bladder lesion.  POSTOPERATIVE DIAGNOSIS:  Interstitial cystitis with Hunner's ulcer and hematuria.  SURGEON:  Excell SeltzerJohn J. [name] HowellsWrenn, MD  ANESTHESIA:  General.  SPECIMEN:  Bladder biopsies from the left lateral wall of the bladder.  BLOOD LOSS:  Minimal.  COMPLICATIONS:  None.  INDICATIONS:  [name] [name] is a 78 year old white female with a history of chronic pelvic pain, irritative voiding symptoms with incontinence with hematuria, who was found on office cystoscopy a lesion on the bladder wall and it was felt that her pain complaints related to her bladder and probably the lesion.  FINDINGS OF PROCEDURE:  She was taken to the operating room where she was given Cipro and general anesthetic induced.  She was placed in lithotomy position and fitted with PAS hose.  Her perineum and genitalia were prepped with Betadine solution, and she was draped in usual sterile fashion.  Cystoscopy was performed using a 22-French scope and 12-degree lens. Examination revealed a normal urethra.  The bladder wall had minimal trabeculation.  No tumors were noted.  No stones were seen.  However, on the left lateral wall, there was approximately 2-3 cm erythematous patch.  There was some exudative material in the center which was suggestive of a Hunner's ulcer.  There was minimal erythema on the right side but no obvious lesion.  The remainder of  the bladder wall was unremarkable.  The ureteral orifices were unremarkable.  The left ureteral orifice was cannulated with 5-French open-end catheter and contrast was instilled.  The left retrograde pyelogram revealed a normal ureter and intrarenal collecting system.  The right ureteral orifice was cannulated with 5-French open-end catheter and contrast was instilled in.  The right retrograde pyelogram revealed a normal ureter and intrarenal collecting system.  Once retrograde pyelography was complete, hydrodistention of the bladder was performed under 80 cm of water pressure.  On initial filling, her capacity under anesthesia was 300 mL and the terminal efflux was bloody. A second dilation was performed and her capacity on the second dilation was 400 mL.  Re-inspection of bladder revealed few glomerulations of the minor bleeding from the lesion on the left lateral wall which was consistent with a Hunner's ulcer.  The cup biopsy forceps were then used to take 2 biopsies from the lesion on the left lateral wall.  The biopsy sites were then generously fulgurated with a Bugbee electrode along with the surrounding abnormal mucosa for near approximately 2-3 cm.  Once the fulguration was complete, a thorough inspection of the bladder revealed no bladder wall perforation and it was felt the Foley was not indicated.  The cystoscope was removed after  draining the bladder and a 14-French red rubber catheter was used to instill 30 mL of 0.25% Marcaine with 400 mg of crushed Pyridium.  The catheter was then removed.  The patient was taken down from lithotomy position.  Her anesthetic was reversed.  She was moved to recovery room in stable condition.  There were no complications.     Excell Seltzer. [name] Howells, M.D.     JJW/MEDQ  D:  08/09/2014  T:  08/09/2014  Job:  [id number]

## 2014-08-19 ENCOUNTER — Ambulatory Visit (INDEPENDENT_AMBULATORY_CARE_PROVIDER_SITE_OTHER): Payer: Medicare Other | Admitting: Urology

## 2014-08-19 DIAGNOSIS — R300 Dysuria: Secondary | ICD-10-CM

## 2014-08-19 DIAGNOSIS — N949 Unspecified condition associated with female genital organs and menstrual cycle: Secondary | ICD-10-CM

## 2014-08-19 DIAGNOSIS — N3010 Interstitial cystitis (chronic) without hematuria: Secondary | ICD-10-CM

## 2014-08-19 DIAGNOSIS — N3941 Urge incontinence: Secondary | ICD-10-CM

## 2014-08-24 ENCOUNTER — Encounter (HOSPITAL_COMMUNITY): Payer: Self-pay | Admitting: Emergency Medicine

## 2014-08-24 ENCOUNTER — Emergency Department (HOSPITAL_COMMUNITY): Payer: Medicare Other

## 2014-08-24 ENCOUNTER — Inpatient Hospital Stay (HOSPITAL_COMMUNITY)
Admission: EM | Admit: 2014-08-24 | Discharge: 2014-08-31 | DRG: 690 | Disposition: A | Discharge status: Home Health | Payer: Medicare Other | Attending: Family Medicine | Admitting: Family Medicine

## 2014-08-24 DIAGNOSIS — N390 Urinary tract infection, site not specified: Secondary | ICD-10-CM

## 2014-08-24 DIAGNOSIS — N3010 Interstitial cystitis (chronic) without hematuria: Principal | ICD-10-CM | POA: Diagnosis present

## 2014-08-24 DIAGNOSIS — R531 Weakness: Secondary | ICD-10-CM | POA: Diagnosis present

## 2014-08-24 DIAGNOSIS — Z9861 Coronary angioplasty status: Secondary | ICD-10-CM

## 2014-08-24 DIAGNOSIS — R0902 Hypoxemia: Secondary | ICD-10-CM

## 2014-08-24 DIAGNOSIS — E039 Hypothyroidism, unspecified: Secondary | ICD-10-CM | POA: Diagnosis present

## 2014-08-24 DIAGNOSIS — I679 Cerebrovascular disease, unspecified: Secondary | ICD-10-CM | POA: Diagnosis present

## 2014-08-24 DIAGNOSIS — R55 Syncope and collapse: Secondary | ICD-10-CM | POA: Diagnosis not present

## 2014-08-24 DIAGNOSIS — I252 Old myocardial infarction: Secondary | ICD-10-CM

## 2014-08-24 DIAGNOSIS — J449 Chronic obstructive pulmonary disease, unspecified: Secondary | ICD-10-CM | POA: Diagnosis present

## 2014-08-24 DIAGNOSIS — Z800 Family history of malignant neoplasm of digestive organs: Secondary | ICD-10-CM

## 2014-08-24 DIAGNOSIS — Z91199 Patient's noncompliance with other medical treatment and regimen due to unspecified reason: Secondary | ICD-10-CM

## 2014-08-24 DIAGNOSIS — M129 Arthropathy, unspecified: Secondary | ICD-10-CM | POA: Diagnosis present

## 2014-08-24 DIAGNOSIS — E785 Hyperlipidemia, unspecified: Secondary | ICD-10-CM | POA: Diagnosis present

## 2014-08-24 DIAGNOSIS — R339 Retention of urine, unspecified: Secondary | ICD-10-CM | POA: Diagnosis not present

## 2014-08-24 DIAGNOSIS — I10 Essential (primary) hypertension: Secondary | ICD-10-CM | POA: Diagnosis present

## 2014-08-24 DIAGNOSIS — Z794 Long term (current) use of insulin: Secondary | ICD-10-CM

## 2014-08-24 DIAGNOSIS — Z9119 Patient's noncompliance with other medical treatment and regimen: Secondary | ICD-10-CM

## 2014-08-24 DIAGNOSIS — Z8673 Personal history of transient ischemic attack (TIA), and cerebral infarction without residual deficits: Secondary | ICD-10-CM

## 2014-08-24 DIAGNOSIS — E119 Type 2 diabetes mellitus without complications: Secondary | ICD-10-CM | POA: Diagnosis present

## 2014-08-24 DIAGNOSIS — A498 Other bacterial infections of unspecified site: Secondary | ICD-10-CM | POA: Diagnosis present

## 2014-08-24 DIAGNOSIS — I509 Heart failure, unspecified: Secondary | ICD-10-CM | POA: Diagnosis present

## 2014-08-24 DIAGNOSIS — G4733 Obstructive sleep apnea (adult) (pediatric): Secondary | ICD-10-CM | POA: Diagnosis present

## 2014-08-24 DIAGNOSIS — Z8249 Family history of ischemic heart disease and other diseases of the circulatory system: Secondary | ICD-10-CM

## 2014-08-24 DIAGNOSIS — I2510 Atherosclerotic heart disease of native coronary artery without angina pectoris: Secondary | ICD-10-CM | POA: Diagnosis present

## 2014-08-24 DIAGNOSIS — Z79899 Other long term (current) drug therapy: Secondary | ICD-10-CM

## 2014-08-24 DIAGNOSIS — N139 Obstructive and reflux uropathy, unspecified: Secondary | ICD-10-CM

## 2014-08-24 DIAGNOSIS — I5030 Unspecified diastolic (congestive) heart failure: Secondary | ICD-10-CM | POA: Diagnosis present

## 2014-08-24 DIAGNOSIS — Z951 Presence of aortocoronary bypass graft: Secondary | ICD-10-CM

## 2014-08-24 DIAGNOSIS — N3941 Urge incontinence: Secondary | ICD-10-CM | POA: Diagnosis present

## 2014-08-24 DIAGNOSIS — J4489 Other specified chronic obstructive pulmonary disease: Secondary | ICD-10-CM | POA: Diagnosis present

## 2014-08-24 DIAGNOSIS — E118 Type 2 diabetes mellitus with unspecified complications: Secondary | ICD-10-CM | POA: Diagnosis present

## 2014-08-24 DIAGNOSIS — Z6836 Body mass index (BMI) 36.0-36.9, adult: Secondary | ICD-10-CM

## 2014-08-24 DIAGNOSIS — K5900 Constipation, unspecified: Secondary | ICD-10-CM | POA: Diagnosis present

## 2014-08-24 DIAGNOSIS — E669 Obesity, unspecified: Secondary | ICD-10-CM | POA: Diagnosis present

## 2014-08-24 LAB — CBC WITH DIFFERENTIAL/PLATELET
BASOS ABS: 0 10*3/uL (ref 0.0–0.1)
BASOS PCT: 0 % (ref 0–1)
EOS ABS: 0.5 10*3/uL (ref 0.0–0.7)
Eosinophils Relative: 4 % (ref 0–5)
HCT: 39.8 % (ref 36.0–46.0)
Hemoglobin: 12.1 g/dL (ref 12.0–15.0)
Lymphocytes Relative: 30 % (ref 12–46)
Lymphs Abs: 3.7 10*3/uL (ref 0.7–4.0)
MCH: 24.1 pg — ABNORMAL LOW (ref 26.0–34.0)
MCHC: 30.4 g/dL (ref 30.0–36.0)
MCV: 79.3 fL (ref 78.0–100.0)
Monocytes Absolute: 1.1 10*3/uL — ABNORMAL HIGH (ref 0.1–1.0)
Monocytes Relative: 9 % (ref 3–12)
NEUTROS ABS: 7.2 10*3/uL (ref 1.7–7.7)
Neutrophils Relative %: 57 % (ref 43–77)
Platelets: 321 10*3/uL (ref 150–400)
RBC: 5.02 MIL/uL (ref 3.87–5.11)
RDW: 16.9 % — ABNORMAL HIGH (ref 11.5–15.5)
WBC: 12.6 10*3/uL — ABNORMAL HIGH (ref 4.0–10.5)

## 2014-08-24 LAB — COMPREHENSIVE METABOLIC PANEL
ALT: 14 U/L (ref 0–35)
ANION GAP: 13 (ref 5–15)
AST: 23 U/L (ref 0–37)
Albumin: 3.9 g/dL (ref 3.5–5.2)
Alkaline Phosphatase: 82 U/L (ref 39–117)
BUN: 13 mg/dL (ref 6–23)
CALCIUM: 9.7 mg/dL (ref 8.4–10.5)
CO2: 26 mEq/L (ref 19–32)
Chloride: 101 mEq/L (ref 96–112)
Creatinine, Ser: 0.77 mg/dL (ref 0.50–1.10)
GFR calc Af Amer: >90 mL/min (ref >90)
GFR calc non Af Amer: 80 mL/min — ABNORMAL LOW (ref >90)
Glucose, Bld: 128 mg/dL — ABNORMAL HIGH (ref 70–99)
Potassium: 4.9 mEq/L (ref 3.7–5.3)
Sodium: 140 mEq/L (ref 137–147)
TOTAL PROTEIN: 7.8 g/dL (ref 6.0–8.3)
Total Bilirubin: 0.4 mg/dL (ref 0.3–1.2)

## 2014-08-24 LAB — URINE MICROSCOPIC-ADD ON

## 2014-08-24 LAB — BLOOD GAS, ARTERIAL
Acid-Base Excess: 0.9 mmol/L (ref 0.0–2.0)
BICARBONATE: 25.6 meq/L — AB (ref 20.0–24.0)
Drawn by: 234301
FIO2: 21 %
O2 Saturation: 85.9 %
PCO2 ART: 45.8 mmHg — AB (ref 35.0–45.0)
PH ART: 7.367 (ref 7.350–7.450)
Patient temperature: 37
TCO2: 23.6 mmol/L (ref 0–100)
pO2, Arterial: 59.6 mmHg — ABNORMAL LOW (ref 80.0–100.0)

## 2014-08-24 LAB — URINALYSIS, ROUTINE W REFLEX MICROSCOPIC
Glucose, UA: 250 mg/dL — AB
Nitrite: POSITIVE — AB
PH: 5 (ref 5.0–8.0)
Protein, ur: >300 mg/dL — AB
Specific Gravity, Urine: 1.025 (ref 1.005–1.030)
Urobilinogen, UA: 4 mg/dL — ABNORMAL HIGH (ref 0.0–1.0)

## 2014-08-24 LAB — PROTIME-INR
INR: 0.97 (ref 0.00–1.49)
Prothrombin Time: 12.9 seconds (ref 11.6–15.2)

## 2014-08-24 LAB — GLUCOSE, CAPILLARY: Glucose-Capillary: 120 mg/dL — ABNORMAL HIGH (ref 70–99)

## 2014-08-24 LAB — TROPONIN I: Troponin I: <0.3 ng/mL (ref <0.30)

## 2014-08-24 LAB — PRO B NATRIURETIC PEPTIDE: PRO B NATRI PEPTIDE: 101.4 pg/mL (ref 0–450)

## 2014-08-24 LAB — D-DIMER, QUANTITATIVE: D-Dimer, Quant: 0.39 ug/mL-FEU (ref 0.00–0.48)

## 2014-08-24 MED ORDER — LISINOPRIL 10 MG PO TABS
10.0000 mg | ORAL_TABLET | Freq: Every morning | ORAL | Status: DC
  Administered 2014-08-25 – 2014-08-29 (×5): 10 mg via ORAL
  Filled 2014-08-24 (×5): qty 1

## 2014-08-24 MED ORDER — ALBUTEROL SULFATE HFA 108 (90 BASE) MCG/ACT IN AERS: 2.0000 | INHALATION_SPRAY | Freq: Four times a day (QID) | RESPIRATORY_TRACT | Status: DC | PRN

## 2014-08-24 MED ORDER — INSULIN DETEMIR 100 UNIT/ML ~~LOC~~ SOLN
50.0000 [IU] | Freq: Every day | SUBCUTANEOUS | Status: DC
  Administered 2014-08-24 – 2014-08-30 (×7): 50 [IU] via SUBCUTANEOUS
  Filled 2014-08-24 (×8): qty 0.5

## 2014-08-24 MED ORDER — ONDANSETRON HCL 4 MG/2ML IJ SOLN
4.0000 mg | Freq: Four times a day (QID) | INTRAMUSCULAR | Status: DC | PRN
  Administered 2014-08-26 – 2014-08-28 (×2): 4 mg via INTRAVENOUS
  Filled 2014-08-24 (×2): qty 2

## 2014-08-24 MED ORDER — ACETAMINOPHEN 650 MG RE SUPP: 650.0000 mg | Freq: Four times a day (QID) | RECTAL | Status: DC | PRN

## 2014-08-24 MED ORDER — INSULIN DETEMIR 100 UNIT/ML ~~LOC~~ SOLN
30.0000 [IU] | Freq: Every day | SUBCUTANEOUS | Status: DC
  Administered 2014-08-25 – 2014-08-31 (×7): 30 [IU] via SUBCUTANEOUS
  Filled 2014-08-24 (×11): qty 0.3

## 2014-08-24 MED ORDER — TRAMADOL HCL 50 MG PO TABS
50.0000 mg | ORAL_TABLET | Freq: Four times a day (QID) | ORAL | Status: DC | PRN
  Administered 2014-08-24 – 2014-08-25 (×3): 50 mg via ORAL
  Filled 2014-08-24 (×3): qty 1

## 2014-08-24 MED ORDER — ATORVASTATIN CALCIUM 20 MG PO TABS
20.0000 mg | ORAL_TABLET | Freq: Every day | ORAL | Status: DC
  Administered 2014-08-25 – 2014-08-30 (×6): 20 mg via ORAL
  Filled 2014-08-24 (×7): qty 1

## 2014-08-24 MED ORDER — ALBUTEROL SULFATE (2.5 MG/3ML) 0.083% IN NEBU
2.5000 mg | INHALATION_SOLUTION | Freq: Four times a day (QID) | RESPIRATORY_TRACT | Status: DC | PRN
  Administered 2014-08-26 – 2014-08-30 (×7): 2.5 mg via RESPIRATORY_TRACT
  Filled 2014-08-24 (×2): qty 3

## 2014-08-24 MED ORDER — NITROGLYCERIN 0.4 MG SL SUBL: 0.4000 mg | SUBLINGUAL_TABLET | SUBLINGUAL | Status: DC | PRN

## 2014-08-24 MED ORDER — SODIUM CHLORIDE 0.9 % IJ SOLN
3.0000 mL | Freq: Two times a day (BID) | INTRAMUSCULAR | Status: DC
  Administered 2014-08-24 – 2014-08-30 (×7): 3 mL via INTRAVENOUS

## 2014-08-24 MED ORDER — DEXTROSE 5 % IV SOLN
1.0000 g | Freq: Once | INTRAVENOUS | Status: AC
  Administered 2014-08-24: 1 g via INTRAVENOUS
  Filled 2014-08-24: qty 10

## 2014-08-24 MED ORDER — SODIUM CHLORIDE 0.9 % IV BOLUS (SEPSIS)
1000.0000 mL | Freq: Once | INTRAVENOUS | Status: AC
  Administered 2014-08-24: 1000 mL via INTRAVENOUS

## 2014-08-24 MED ORDER — ASPIRIN EC 325 MG PO TBEC
325.0000 mg | DELAYED_RELEASE_TABLET | Freq: Every day | ORAL | Status: DC
  Administered 2014-08-24 – 2014-08-31 (×8): 325 mg via ORAL
  Filled 2014-08-24 (×8): qty 1

## 2014-08-24 MED ORDER — SODIUM CHLORIDE 0.9 % IV SOLN
INTRAVENOUS | Status: AC
  Administered 2014-08-24: 19:00:00 via INTRAVENOUS

## 2014-08-24 MED ORDER — LEVOTHYROXINE SODIUM 25 MCG PO TABS
25.0000 ug | ORAL_TABLET | Freq: Every day | ORAL | Status: DC
  Administered 2014-08-25 – 2014-08-31 (×7): 25 ug via ORAL
  Filled 2014-08-24 (×9): qty 1

## 2014-08-24 MED ORDER — LORAZEPAM 0.5 MG PO TABS: 0.5000 mg | ORAL_TABLET | ORAL | Status: DC | PRN

## 2014-08-24 MED ORDER — ONDANSETRON HCL 4 MG PO TABS: 4.0000 mg | ORAL_TABLET | Freq: Four times a day (QID) | ORAL | Status: DC | PRN

## 2014-08-24 MED ORDER — DESIPRAMINE HCL 25 MG PO TABS
10.0000 mg | ORAL_TABLET | Freq: Two times a day (BID) | ORAL | Status: DC
  Administered 2014-08-25 – 2014-08-31 (×13): 12.5 mg via ORAL
  Filled 2014-08-24 (×21): qty 1

## 2014-08-24 MED ORDER — ALBUTEROL SULFATE (2.5 MG/3ML) 0.083% IN NEBU
2.5000 mg | INHALATION_SOLUTION | Freq: Four times a day (QID) | RESPIRATORY_TRACT | Status: DC | PRN
  Filled 2014-08-24 (×5): qty 3

## 2014-08-24 MED ORDER — ENOXAPARIN SODIUM 40 MG/0.4ML ~~LOC~~ SOLN
40.0000 mg | SUBCUTANEOUS | Status: DC
  Administered 2014-08-24 – 2014-08-30 (×7): 40 mg via SUBCUTANEOUS
  Filled 2014-08-24 (×7): qty 0.4

## 2014-08-24 MED ORDER — INSULIN ASPART 100 UNIT/ML ~~LOC~~ SOLN
0.0000 [IU] | Freq: Three times a day (TID) | SUBCUTANEOUS | Status: DC
  Administered 2014-08-25: 2 [IU] via SUBCUTANEOUS
  Administered 2014-08-25: 8 [IU] via SUBCUTANEOUS
  Administered 2014-08-25: 3 [IU] via SUBCUTANEOUS
  Administered 2014-08-26: 5 [IU] via SUBCUTANEOUS
  Administered 2014-08-26 – 2014-08-27 (×2): 3 [IU] via SUBCUTANEOUS
  Administered 2014-08-27: 5 [IU] via SUBCUTANEOUS
  Administered 2014-08-27: 8 [IU] via SUBCUTANEOUS
  Administered 2014-08-28 (×2): 3 [IU] via SUBCUTANEOUS
  Administered 2014-08-28 – 2014-08-29 (×2): 5 [IU] via SUBCUTANEOUS
  Administered 2014-08-29: 3 [IU] via SUBCUTANEOUS
  Administered 2014-08-29: 5 [IU] via SUBCUTANEOUS
  Administered 2014-08-30: 3 [IU] via SUBCUTANEOUS
  Administered 2014-08-30 (×2): 8 [IU] via SUBCUTANEOUS
  Administered 2014-08-31: 2 [IU] via SUBCUTANEOUS

## 2014-08-24 MED ORDER — SODIUM CHLORIDE 0.9 % IV SOLN
INTRAVENOUS | Status: DC
  Administered 2014-08-24 – 2014-08-30 (×8): via INTRAVENOUS

## 2014-08-24 MED ORDER — ACETAMINOPHEN 325 MG PO TABS
650.0000 mg | ORAL_TABLET | Freq: Four times a day (QID) | ORAL | Status: DC | PRN
  Administered 2014-08-25 – 2014-08-26 (×2): 650 mg via ORAL
  Filled 2014-08-24 (×2): qty 2

## 2014-08-24 MED ORDER — ZOLPIDEM TARTRATE 5 MG PO TABS
5.0000 mg | ORAL_TABLET | Freq: Every day | ORAL | Status: DC
  Administered 2014-08-24 – 2014-08-30 (×7): 5 mg via ORAL
  Filled 2014-08-24 (×7): qty 1

## 2014-08-24 MED ORDER — DEXTROSE 5 % IV SOLN
1.0000 g | INTRAVENOUS | Status: DC
  Administered 2014-08-25 – 2014-08-29 (×5): 1 g via INTRAVENOUS
  Filled 2014-08-24 (×5): qty 10

## 2014-08-24 MED ORDER — INSULIN ASPART 100 UNIT/ML ~~LOC~~ SOLN
0.0000 [IU] | Freq: Every day | SUBCUTANEOUS | Status: DC
  Administered 2014-08-25: 2 [IU] via SUBCUTANEOUS
  Administered 2014-08-26: 3 [IU] via SUBCUTANEOUS
  Administered 2014-08-27 – 2014-08-30 (×4): 2 [IU] via SUBCUTANEOUS

## 2014-08-24 NOTE — ED Notes (Signed)
Patient ambulated, during ambulate reports feeling "dizzy" and "like knees are going to give out".

## 2014-08-24 NOTE — ED Provider Notes (Signed)
CSN: [id number]     Arrival date & time 08/24/14  1450 History   First MD Initiated Contact with Patient 08/24/14 1500  \  This chart was scribed for Glynn Octave, MD by Tonye Royalty, [name] This patient was seen in room A334/A334-01 and the patient's care was started at 3:10 PM.     Chief Complaint  Patient presents with  . Near Syncope   The history is provided by the patient. No language interpreter was used.    HPI Comments: [name] [name] is a 78 y.o. female who presents to the Emergency Department complaining of near syncope earlier today. She states that she was getting out of a car after a 10-15 minute car ride when she felt a spinning sensation and her knees buckled, but the people around her caught her before she hit the ground. She states she still feels dizzy and that it is worse when standing. She reports feeling hot and sweaty, nauseaus, and recent constipation. She states she "hasn't felt good" for a few days. She reports having a cystoscopy 5 days ago for dysuria and hematuria; since then, she reports worsened abdominal pain, dysuria, and decreased control over her bladder. She reports a history of CVA, CHF, and diabetes. She states her blood sugar measured at 111 this morning. She denies chest pain, vomiting, headache, fever, coughing, or shortness of breath.   Past Medical History  Diagnosis Date  . Arteriosclerotic cardiovascular disease (ASCVD) CARDIOLOGIST-   DR Dietrich Pates     CABG surgery in 2001; nl stress nuclear-2007; angiography in 01/2006- TO of LAD; 70% ostial stenosis      of a small ramus; patent grafts; normal EF; cath in 12/2009 essentially unchanged; EF-66%;  Congestive heart failure with preserved LV systolic function  . Hypertension   . Varicose veins   . Hypothyroidism   . Insulin dependent type 2 diabetes mellitus   . Peripheral neuropathy   . Diastolic CHF   . Hyperlipidemia   . History of CVA (cerebrovascular accident)     2013--  LACUNAR  INFARCTIONS RIGHT THALAMUS---  residual left side of  lip numb  and left eye vision worse (which corrented lens implant from cataract extraction)  . Lesion of bladder   . Hematuria   . Arthritis   . DDD (degenerative disc disease), lumbar   . OSA (obstructive sleep apnea)     NON-COMPLIANT CPAP  . Chronic obstructive pulmonary disease     Chronic bronchitis  . History of MI (myocardial infarction)     07/ 2001   s/p  cabg x2  . S/P CABG x 2     07-02-2000  . SUI (stress urinary incontinence, female)   . Lower urinary tract symptoms (LUTS)   . Psoriasis     lower leg  . Thinning of skin    Past Surgical History  Procedure Laterality Date  . Rotator cuff repair Right 1995  . Lumbar spine surgery  x2    yrs ago  . Carpal tunnel release Bilateral yrs ago  . Orif humerus fracture Right 04-18-2008  . Knee arthroscopy Left 10-18-2003    SYNOVECTOMY  . Repair flexor tendon hand Right 09-20-2008    CARPI RADIALIS TENDON TRANSFER TO EXTENSOR TO INDEX, MIDDLE, RING , LITTLE FINGERS AND DIGITI MININI  . Cardiovascular stress test  08-28-2011   DR ROTHBART    NEGATIVE NUCLEAR STUDY/  GLOBAL LVSF/  EF 65%  . Cholecystectomy  1990  . Appendectomy  age 28  .  Total abdominal hysterectomy w/ bilateral salpingoophorectomy  age 69  . Cataract extraction w/ intraocular lens  implant, bilateral  2013  . Cardiac catheterization  1991    No sig. cad  . Coronary angioplasty  11-13-1999    balloon angioplasty to mLAD , d2 of LAD  . Coronary angioplasty with stent placement  01-17-2000    PCI stenting to mLAD & D2 of LAD  . Cardiac catheterization  07-01-2000    high grade in-stent restenosis  . Cardiac catheterization  04-30-2001    single native vessel cad/ graft patent  . Cardiac catheterization  02-13-2006  dr Juanda Chance    2V cad with total occluded LAD  and  70% ostial of small ramus/  grafts patent/  normal ef  . Cardiac catheterization  01-20-2000  dr Clifton James    essentially unchanged;   severe single vessel cad with diffuse disease throughout CFX and RCA/ ef 66%;  chf with preserved LVSF  . Coronary artery bypass graft  07-02-2000   DR Tressie Stalker    SVG to diagonal, LIMA to LAD  . Transthoracic echocardiogram  12-12-2013    MILD LVH/  EF 60-65%/  GRADE I DIASTOLIC DYSFUNCTION/  MILD LAE  . Cystoscopy w/ retrogrades Bilateral 08/09/2014    Procedure: CYSTOSCOPY, BILATERAL RETROGRADE, HYDRODISTENSION, BLADDER BIOPSY WITH FULGERATION, INSTILL PYRIDIUM AND MARCAINE ;  Surgeon: Anner Crete, MD;  Location: [facility];  Service: Urology;  Laterality: Bilateral;   Family History  Problem Relation Age of Onset  . Coronary artery disease      Multiple first and second-degree relatives  . Aneurysm      Cerebral circulation  . Hypertension Mother   . Colon cancer Sister   . Clotting disorder      Children diagnosed with hypercoagulable state   History  Substance Use Topics  . Smoking status: Never Smoker   . Smokeless tobacco: Never Used  . Alcohol Use: No   OB History   Grav Para Term Preterm Abortions TAB SAB Ect Mult Living                 Review of Systems A complete 10 system review of systems was obtained and all systems are negative except as noted in the HPI and PMH.    Allergies  Codeine  Home Medications   Prior to Admission medications   Medication Sig Start Date End Date Taking? Authorizing Provider  aspirin EC 325 MG tablet Take 325 mg by mouth daily.   Yes Historical Provider, MD  desipramine (NOPRAMIN) 10 MG tablet Take 10 mg by mouth 2 (two) times daily. 08/22/14  Yes Historical Provider, MD  insulin detemir (LEVEMIR) 100 UNIT/ML injection Inject 25-70 Units into the skin 2 (two) times daily. 30 units in the morning and 70 units at bedtime   Yes Historical Provider, MD  insulin lispro (HUMALOG) 100 UNIT/ML injection Inject 20-25 Units into the skin 3 (three) times daily.    Yes Historical Provider, MD  levothyroxine (SYNTHROID,  LEVOTHROID) 25 MCG tablet Take 25 mcg by mouth every morning.    Yes Historical Provider, MD  lisinopril (PRINIVIL,ZESTRIL) 10 MG tablet Take 1 tablet by mouth every morning.  11/09/13  Yes Historical Provider, MD  LORazepam (ATIVAN) 0.5 MG tablet Take 0.5 mg by mouth every 4 (four) hours as needed. nerves   Yes Historical Provider, MD  metFORMIN (GLUCOPHAGE) 500 MG tablet Take 500 mg by mouth 2 (two) times daily with a meal.  Yes Historical Provider, MD  phenazopyridine (PYRIDIUM) 200 MG tablet Take 1 tablet (200 mg total) by mouth 3 (three) times daily as needed for pain. 08/09/14  Yes Anner Crete, MD  rosuvastatin (CRESTOR) 10 MG tablet Take 10 mg by mouth at bedtime.    Yes Historical Provider, MD  traMADol (ULTRAM) 50 MG tablet Take 50 mg by mouth every 6 (six) hours as needed (pain).   Yes Historical Provider, MD  zolpidem (AMBIEN) 5 MG tablet Take 5 mg by mouth at bedtime. 08/12/14  Yes Historical Provider, MD  nitroGLYCERIN (NITROSTAT) 0.4 MG SL tablet Place 0.4 mg under the tongue every 5 (five) minutes as needed for chest pain.     Historical Provider, MD   BP 142/81  Pulse 68  Temp(Src) 98 F (36.7 C) (Oral)  Resp 18  Ht  (1.626 m)  Wt 210 lb (95.255 kg)  BMI 36.03 kg/m2  SpO2 83% Physical Exam  Nursing note and vitals reviewed. Constitutional: She is oriented to person, place, and time. She appears well-developed and well-nourished. No distress.  Appears pale  HENT:  Head: Normocephalic and atraumatic.  Mouth/Throat: Oropharynx is clear and moist. No oropharyngeal exudate.  Eyes: Conjunctivae and EOM are normal. Pupils are equal, round, and reactive to light.  Neck: Normal range of motion. Neck supple.  No meningismus.  Cardiovascular: Normal rate, regular rhythm, normal heart sounds and intact distal pulses.   No murmur heard. Pulmonary/Chest: Effort normal and breath sounds normal. No respiratory distress.  Abdominal: Soft. There is no tenderness. There is no  rebound and no guarding.  Genitourinary:  Suprapubic tenderness  Musculoskeletal: Normal range of motion. She exhibits no edema and no tenderness.  Neurological: She is alert and oriented to person, place, and time. No cranial nerve deficit. She exhibits normal muscle tone. Coordination normal.  No ataxia on finger to nose bilaterally. No pronator drift. 5/5 strength throughout. CN 2-12 intact. Negative Romberg. Equal grip strength. Sensation intact. Gait is normal.   Skin: Skin is warm.  Psychiatric: She has a normal mood and affect. Her behavior is normal.    ED Course  Procedures (including critical care time) Labs Review Labs Reviewed  CBC WITH DIFFERENTIAL - Abnormal; Notable for the following:    WBC 12.6 (*)    MCH 24.1 (*)    RDW 16.9 (*)    Monocytes Absolute 1.1 (*)    All other components within normal limits  COMPREHENSIVE METABOLIC PANEL - Abnormal; Notable for the following:    Glucose, Bld 128 (*)    GFR calc non Af Amer 80 (*)    All other components within normal limits  URINALYSIS, ROUTINE W REFLEX MICROSCOPIC - Abnormal; Notable for the following:    Color, Urine ORANGE (*)    APPearance HAZY (*)    Glucose, UA 250 (*)    Hgb urine dipstick LARGE (*)    Bilirubin Urine MODERATE (*)    Ketones, ur TRACE (*)    Protein, ur >300 (*)    Urobilinogen, UA 4.0 (*)    Nitrite POSITIVE (*)    Leukocytes, UA MODERATE (*)    All other components within normal limits  URINE MICROSCOPIC-ADD ON - Abnormal; Notable for the following:    Squamous Epithelial / LPF MANY (*)    Bacteria, UA MANY (*)    All other components within normal limits  BLOOD GAS, ARTERIAL - Abnormal; Notable for the following:    pCO2 arterial 45.8 (*)  pO2, Arterial 59.6 (*)    Bicarbonate 25.6 (*)    All other components within normal limits  GLUCOSE, CAPILLARY - Abnormal; Notable for the following:    Glucose-Capillary 120 (*)    All other components within normal limits  GLUCOSE,  CAPILLARY - Abnormal; Notable for the following:    Glucose-Capillary 134 (*)    All other components within normal limits  URINE CULTURE  TROPONIN I  PROTIME-INR  PRO B NATRIURETIC PEPTIDE  D-DIMER, QUANTITATIVE    Imaging Review Dg Chest 2 View  08/24/2014   CLINICAL DATA:  Near syncope.  Dizziness  EXAM: CHEST  2 VIEW  COMPARISON:  12/12/2013  FINDINGS: Mild cardiomegaly which is stable from prior. The patient has undergone CABG. Negative aortic contours.  There is no edema, consolidation, effusion, or pneumothorax. Mild scarring or atelectasis in the left upper lung.  Proximal right humerus ORIF and soft tissue anchoring.  IMPRESSION: No active cardiopulmonary disease.   Electronically Signed   By: Tiburcio Pea M.D.   On: 08/24/2014 16:14     EKG Interpretation None     DIAGNOSTIC STUDIES: Oxygen Saturation is 91% on room air, low by my interpretation.    COORDINATION OF CARE:    MDM   Final diagnoses:  Syncope, unspecified syncope type  Hypoxia  Urinary (tract) obstruction  Urinary tract infection without hematuria, site unspecified   Near syncopal episode after getting out of the car today. Patient states she has not felt well since August 18 when she had a cystoscopy. She endorses generalized weakness, dizziness with standing and hematuria. She was diagnosed with interstitial cystitis. She denies any fever, chest pain or shortness of breath.  Patient noted in the room to have hypoxia to 90%. She denies any chest pain or shortness of breath. States she has noticed her oxygen levels lower home as well. She is not a smoker.  Chest x-ray negative.D-dimer negative.  EKG unchanged with anterior and inferior Q waves.  Borderline hypoxia to the 90s. D-dimer negative. Orthostatics negative. Patient continues to endorse. dizziness and lightheadedness with position change. No chest pain. She complains of left-sided rib pain that is worse with deep breathing. Mild hypoxia on  ABG.  PAtient with history of COPD and sleep apnea.  Doubt PE with negative D-dimer.  Suspect vasovagal syncope in setting of dehydration and possible UTI.  Will admit for IVF and further evaluation of hypoxia. UA difficult to interpret in setting of recent cystoscopy.  D/w DR. Dhungel.      Date: 08/24/2014  Rate: 73  Rhythm: normal sinus rhythm  QRS Axis: left  Intervals: normal  ST/T Wave abnormalities: normal  Conduction Disutrbances:none  Narrative Interpretation:   Old EKG Reviewed: unchanged  I personally performed the services described in this documentation, which was scribed in my presence. The recorded information has been reviewed and is accurate.    Glynn Octave, MD 08/25/14 [phone]

## 2014-08-24 NOTE — ED Notes (Signed)
Report given to [facility], Department 300. All questions answered

## 2014-08-24 NOTE — H&P (Signed)
[facility] History and Physical  [name] [name] ZOX:[id number] DOB: May 31, 1936 DOA: 08/24/2014  Referring physician: Dr. Manus Gunning PCP: Alice Reichert, MD   Chief Complaint:  Near syncope  HPI:  78 year old obese female with multiple comorbidities including coronary artery disease with history of CABG, type 2 diabetes mellitus on insulin, hypertension, COPD and obstructive sleep apnea (noncompliant with CPAP), history of CVA, with his recent cystoscopy with biopsy of the bladder lesion (showed inflammatory urothelium) who presented to the ED after she had a near syncope today. Patient reported getting out of the car and was feeling dizzy and sweaty as it was very hot outside. Shortly she felt very unsteady and phalanx 2 is, followup but was held  by her daughter. Patient reports that since the cystoscopy done about 2 weeks back she has had persistent dysuria and frequency of urination causing her to get up to urinate in night several times . She reports feeling weak and losing her sleep because of this. She also reports poor appetite for past 2 days for the same reason. She reports feeling nauseous today. She denies any fever or chills, headache, vomiting, chest pain, palpitations, shortness of breath, abdominal pain, bowel symptoms. Denies recent travel or being bedbound..  Course in the ED Patient's vitals were stable except for her O2 sat dropping down to 88 on room air on several occasions. Blood work done to WBC of 2.6, normal hemoglobin and platelets. Normal chemistry. UA was suggestive of UTI. Chest x-ray was unremarkable. D-dimer was negative. EKG was unremarkable as well. An ABG was done which showed a mild hypoxemia. Patient given 1L IV normal saline bolus and 1 g IV Rocephin after cultures sent. Hospitalist admission to observation requested.  Review of Systems:  Constitutional: Denies fever, chills, diaphoresis, appetite change and fatigue.  HEENT: Denies photophobia, eye pain,  redness, hearing loss, ear pain,  trouble swallowing, neck pain, Respiratory: Denies SOB, DOE, cough, chest tightness,  and wheezing.   Cardiovascular: Denies chest pain, palpitations and leg swelling.  Gastrointestinal: nausea, denies vomiting, abdominal pain, diarrhea, blood in stool and abdominal distention.  Genitourinary: dysuria, urgency, frequency, denies hematuria, flank pain and difficulty urinating.  Endocrine: Denies: hot or cold intolerance, polyuria, polydipsia. Musculoskeletal: Denies myalgias, back pain, joint swelling, arthralgias and gait problem.  Skin: Denies pallor, rash and wound.  Neurological:  dizziness, lightheadedness, near syncope, denies seizures, syncope,  numbness and headaches.  Psychiatric/Behavioral: Denies confusion,   Past Medical History  Diagnosis Date  . Arteriosclerotic cardiovascular disease (ASCVD) CARDIOLOGIST-   DR Dietrich Pates     CABG surgery in 2001; nl stress nuclear-2007; angiography in 01/2006- TO of LAD; 70% ostial stenosis      of a small ramus; patent grafts; normal EF; cath in 12/2009 essentially unchanged; EF-66%;  Congestive heart failure with preserved LV systolic function  . Hypertension   . Varicose veins   . Hypothyroidism   . Insulin dependent type 2 diabetes mellitus   . Peripheral neuropathy   . Diastolic CHF   . Hyperlipidemia   . History of CVA (cerebrovascular accident)     2013--  LACUNAR INFARCTIONS RIGHT THALAMUS---  residual left side of  lip numb  and left eye vision worse (which corrented lens implant from cataract extraction)  . Lesion of bladder   . Hematuria   . Arthritis   . DDD (degenerative disc disease), lumbar   . OSA (obstructive sleep apnea)     NON-COMPLIANT CPAP  . Chronic obstructive pulmonary disease  Chronic bronchitis  . History of MI (myocardial infarction)     07/ 2001   s/p  cabg x2  . S/P CABG x 2     07-02-2000  . SUI (stress urinary incontinence, female)   . Lower urinary tract symptoms  (LUTS)   . Psoriasis     lower leg  . Thinning of skin    Past Surgical History  Procedure Laterality Date  . Rotator cuff repair Right 1995  . Lumbar spine surgery  x2    yrs ago  . Carpal tunnel release Bilateral yrs ago  . Orif humerus fracture Right 04-18-2008  . Knee arthroscopy Left 10-18-2003    SYNOVECTOMY  . Repair flexor tendon hand Right 09-20-2008    CARPI RADIALIS TENDON TRANSFER TO EXTENSOR TO INDEX, MIDDLE, RING , LITTLE FINGERS AND DIGITI MININI  . Cardiovascular stress test  08-28-2011   DR ROTHBART    NEGATIVE NUCLEAR STUDY/  GLOBAL LVSF/  EF 65%  . Cholecystectomy  1990  . Appendectomy  age 89  . Total abdominal hysterectomy w/ bilateral salpingoophorectomy  age 35  . Cataract extraction w/ intraocular lens  implant, bilateral  2013  . Cardiac catheterization  1991    No sig. cad  . Coronary angioplasty  11-13-1999    balloon angioplasty to mLAD , d2 of LAD  . Coronary angioplasty with stent placement  01-17-2000    PCI stenting to mLAD & D2 of LAD  . Cardiac catheterization  07-01-2000    high grade in-stent restenosis  . Cardiac catheterization  04-30-2001    single native vessel cad/ graft patent  . Cardiac catheterization  02-13-2006  dr Juanda Chance    2V cad with total occluded LAD  and  70% ostial of small ramus/  grafts patent/  normal ef  . Cardiac catheterization  01-20-2000  dr Clifton James    essentially unchanged;  severe single vessel cad with diffuse disease throughout CFX and RCA/ ef 66%;  chf with preserved LVSF  . Coronary artery bypass graft  07-02-2000   DR Tressie Stalker    SVG to diagonal, LIMA to LAD  . Transthoracic echocardiogram  12-12-2013    MILD LVH/  EF 60-65%/  GRADE I DIASTOLIC DYSFUNCTION/  MILD LAE  . Cystoscopy w/ retrogrades Bilateral 08/09/2014    Procedure: CYSTOSCOPY, BILATERAL RETROGRADE, HYDRODISTENSION, BLADDER BIOPSY WITH FULGERATION, INSTILL PYRIDIUM AND MARCAINE ;  Surgeon: Anner Crete, MD;  Location: [facility];  Service: Urology;  Laterality: Bilateral;   Social History:  reports that she has never smoked. She has never used smokeless tobacco. She reports that she does not drink alcohol or use illicit drugs.  Allergies  Allergen Reactions  . Codeine Itching    Family History  Problem Relation Age of Onset  . Coronary artery disease      Multiple first and second-degree relatives  . Aneurysm      Cerebral circulation  . Hypertension Mother   . Colon cancer Sister   . Clotting disorder      Children diagnosed with hypercoagulable state    Prior to Admission medications   Medication Sig Start Date End Date Taking? Authorizing Provider  aspirin EC 325 MG tablet Take 325 mg by mouth daily.   Yes Historical Provider, MD  desipramine (NOPRAMIN) 10 MG tablet Take 10 mg by mouth 2 (two) times daily. 08/22/14  Yes Historical Provider, MD  insulin detemir (LEVEMIR) 100 UNIT/ML injection Inject 25-70 Units into the skin 2 (  two) times daily. 30 units in the morning and 70 units at bedtime   Yes Historical Provider, MD  insulin lispro (HUMALOG) 100 UNIT/ML injection Inject 20-25 Units into the skin 3 (three) times daily.    Yes Historical Provider, MD  levothyroxine (SYNTHROID, LEVOTHROID) 25 MCG tablet Take 25 mcg by mouth every morning.    Yes Historical Provider, MD  lisinopril (PRINIVIL,ZESTRIL) 10 MG tablet Take 1 tablet by mouth every morning.  11/09/13  Yes Historical Provider, MD  LORazepam (ATIVAN) 0.5 MG tablet Take 0.5 mg by mouth every 4 (four) hours as needed. nerves   Yes Historical Provider, MD  metFORMIN (GLUCOPHAGE) 500 MG tablet Take 500 mg by mouth 2 (two) times daily with a meal.     Yes Historical Provider, MD  phenazopyridine (PYRIDIUM) 200 MG tablet Take 1 tablet (200 mg total) by mouth 3 (three) times daily as needed for pain. 08/09/14  Yes Anner Crete, MD  rosuvastatin (CRESTOR) 10 MG tablet Take 10 mg by mouth at bedtime.    Yes Historical Provider, MD  traMADol (ULTRAM)  50 MG tablet Take 50 mg by mouth every 6 (six) hours as needed (pain).   Yes Historical Provider, MD  zolpidem (AMBIEN) 5 MG tablet Take 5 mg by mouth at bedtime. 08/12/14  Yes Historical Provider, MD  nitroGLYCERIN (NITROSTAT) 0.4 MG SL tablet Place 0.4 mg under the tongue every 5 (five) minutes as needed for chest pain.     Historical Provider, MD     Physical Exam:  Filed Vitals:   08/24/14 1631 08/24/14 1718 08/24/14 1721 08/24/14 1829  BP: 110/59 131/68 131/68 125/53  Pulse:  71 72 84  Resp: Height:      Weight:      SpO2:  92% 90% 92%    Constitutional: Vital signs reviewed. Elderly Female lying in bed in no acute distress HEENT: no pallor, no icterus, moist oral mucosa,  Cardiovascular: RRR, S1 normal, S2 normal, no MRG Chest: CTAB, scattered rhonchi, no wheezes or crackles  Abdominal: Soft. Non-tender, non-distended, bowel sounds are normal,  GU: no CVA or suprapubic tenderness  Ext: warm, no edema Neurological: Alert and oriented, pain most 2-12 intact, no nystagmus, normal motor power and reflex, normal sensations, stable gait.  Labs on Admission:  Basic Metabolic Panel:  Recent Labs Lab 08/24/14 1541  NA 140  K 4.9  CL 101  CO2 26  GLUCOSE 128*  BUN 13  CREATININE 0.77  CALCIUM 9.7   Liver Function Tests:  Recent Labs Lab 08/24/14 1541  AST 23  ALT 14  ALKPHOS 82  BILITOT 0.4  PROT 7.8  ALBUMIN 3.9   No results found for this basename: LIPASE, AMYLASE,  in the last 168 hours No results found for this basename: AMMONIA,  in the last 168 hours CBC:  Recent Labs Lab 08/24/14 1541  WBC 12.6*  NEUTROABS 7.2  HGB 12.1  HCT 39.8  MCV 79.3  PLT 321   Cardiac Enzymes:  Recent Labs Lab 08/24/14 1541  TROPONINI <0.30   BNP: No components found with this basename: POCBNP,  CBG: No results found for this basename: GLUCAP,  in the last 168 hours  Radiological Exams on Admission: Dg Chest 2 View  08/24/2014   CLINICAL DATA:   Near syncope.  Dizziness  EXAM: CHEST  2 VIEW  COMPARISON:  12/12/2013  FINDINGS: Mild cardiomegaly which is stable from prior. The patient has undergone CABG. Negative aortic contours.  There is no edema, consolidation, effusion, or pneumothorax. Mild scarring or atelectasis in the left upper lung.  Proximal right humerus ORIF and soft tissue anchoring.  IMPRESSION: No active cardiopulmonary disease.   Electronically Signed   By: Tiburcio Pea M.D.   On: 08/24/2014 16:14    EKG: NSR , LAFB ( old ) no ST -T changes  Assessment/Plan  Principal problem Near syncope Possibly vasovagal with dehydration and UTI. Doubt TIA or stroke. Admitted on observation to telemetry. Place on IV hydration. Orthostasis negative on admission. -PT eval. If symptoms persist would need full cardiogenic workup.    Active Problems: UTI Recent cystoscopy. Urine culture sent. On empiric Rocephin.  Hypoxia Reportedly patient has underlying COPD and obstructive sleep apnea is recommended for CPAP but she refused. Has scattered rhonchi on exam. Chest x-ray unremarkable. Oral O2 sat drops as low as 88% on room air and mainly with ambulation. Possibly the setting of underlying COPD. her well score is 0 and strongly doubt PE. -monitor-O2 sat. Place  on albuterol inhaler and prn  Nebs.     Hypothyroidism Continue Synthroid    DIABETES MELLITUS, TYPE II Hold metformin. Resume home dose of levemir at a lower dose ( 30 u am and 50 u pm). Monitor on sliding scale insulin    HYPERTENSION Resume home medications   History of CVA/ CAD Continue aspirin and statin       Diet:cardiac  DVT prophylaxis: sq lovenox   Code Status: full code Family Communication: None at bedside Disposition Plan: Admit to observation  Eddie North [facility] Pager [phone]  Total time spent on admission :50 minutes  If 7PM-7AM, please contact night-coverage [email or web address] Password [facility] 08/24/2014, 6:47 PM

## 2014-08-24 NOTE — ED Notes (Signed)
Patient states that she 8/28 she had a cystoscopy and since has has pain in bladder and when urinating. Patient states today she started feeling faint around 230pm and almost passed out

## 2014-08-24 NOTE — ED Notes (Signed)
Pulse ox was 92 when ambulating

## 2014-08-25 LAB — GLUCOSE, CAPILLARY
GLUCOSE-CAPILLARY: 295 mg/dL — AB (ref 70–99)
GLUCOSE-CAPILLARY: 171 mg/dL — AB (ref 70–99)
Glucose-Capillary: 134 mg/dL — ABNORMAL HIGH (ref 70–99)
Glucose-Capillary: 245 mg/dL — ABNORMAL HIGH (ref 70–99)

## 2014-08-25 LAB — URINE CULTURE
COLONY COUNT: NO GROWTH
CULTURE: NO GROWTH

## 2014-08-25 MED ORDER — HYDROCODONE-HOMATROPINE 5-1.5 MG/5ML PO SYRP
5.0000 mL | ORAL_SOLUTION | ORAL | Status: DC | PRN
  Administered 2014-08-25 – 2014-08-31 (×10): 5 mL via ORAL
  Filled 2014-08-25 (×10): qty 5

## 2014-08-25 NOTE — Evaluation (Signed)
Physical Therapy Evaluation Patient Details Name: [name] [name] MRN: [id number] DOB: [birth year] Today's Date: 08/25/2014   History of Present Illness  78 year old obese female with multiple comorbidities including coronary artery disease with history of CABG, type 2 diabetes mellitus on insulin, hypertension, COPD and obstructive sleep apnea (noncompliant with CPAP), history of CVA, with his recent cystoscopy with biopsy of the bladder lesion (showed inflammatory urothelium) who presented to the ED after she had a near syncope today. Patient reported getting out of the car and was feeling dizzy and sweaty as it was very hot outside. Shortly she felt very unsteady and phalanx 2 is, followup but was held  by her daughter. Patient reports that since the cystoscopy done about 2 weeks back she has had persistent dysuria and frequency of urination causing her to get up to urinate in night several times . She reports feeling weak and losing her sleep because of this. She also reports poor appetite for past 2 days for the same reason. She reports feeling nauseous today. She denies any fever or chills, headache, vomiting, chest pain, palpitations, shortness of breath, abdominal pain, bowel symptoms. Denies recent travel or being bedbound..  Patient's vitals were stable except for her O2 sat dropping down to 88 on room air on several occasions. Blood work done to WBC of 2.6, normal hemoglobin and platelets. Normal chemistry. UA was suggestive of UTI. Chest x-ray was unremarkable. D-dimer was negative. EKG was unremarkable as well. An ABG was done which showed a mild hypoxemia.  Clinical Impression  Pt is a 78 year old female who presents to PT with dx of near syncope.  During evaluation, pt was mod (I) with bed mobility skills and transfers, though did require increased attempts and momentum to transfer to standing.  Pt reports she has a lift chair at home and that's all she sits in at the house.  During gait  assessment, pt required supervision and no AD, though 1 episode of unsteadiness was noted which pt was able to self correct.  Noted O2 sats at 83% after gait, taking 1 minutes 30 seconds to return to 95% during seated rest break.  Gait distance limited by fatigue with activity, though pt does not report SOB.  Pt reports she has a CPAP machine though does not use it as she can not get used to it.  Pt reports she has been up in the hospital to take herself to the bathroom and does not feel unsteady or SOB during this ambulation.  Educated pt on possible need for std cane during gait secondary to episode of unsteadiness during gait assessment, though pt reports she would not like to use a cane.  Pt to be discharged from acute PT services, though pt would benefit from HHPT to address activity tolerance, balance, and strengthening in home environment and community obstacles.     Follow Up Recommendations Home health PT    Equipment Recommendations   (Recommended possible use of std cane as pt had one episode of unsteadiness, though able to self correct, however pt reports she would not use AD.)       Precautions / Restrictions Precautions Precautions: Fall Restrictions Weight Bearing Restrictions: No      Mobility  Bed Mobility Overal bed mobility: Modified Independent                Transfers Overall transfer level: Modified independent               General transfer comment:  Use of momentum and 2 trials to attempt standing prior to transferring sit -> stand  Ambulation/Gait Ambulation/Gait assistance: Supervision;Min guard Ambulation Distance (Feet): 50 Feet Assistive device: None Gait Pattern/deviations: Step-through pattern;Decreased dorsiflexion - right;Decreased dorsiflexion - left   Gait velocity interpretation: Below normal speed for age/gender General Gait Details: 1 stumble/LOB which pt was able to self correct.  Pt reports increasing fatigue with gait and distance  limited.  No SOB reported, though O2 sats to 83% after returning to sitting; increased back to 95% after 1'30" sitting break.     Balance Overall balance assessment: No apparent balance deficits (not formally assessed)                                           Pertinent Vitals/Pain Pain Assessment: No/denies pain    Home Living Family/patient expects to be discharged to:: Private residence Living Arrangements: Spouse/significant other;Children (Lives with husband and son) Available Help at Discharge: Family;Available 24 hours/day Type of Home: House Home Access: Stairs to enter Entrance Stairs-Rails: Can reach both Entrance Stairs-Number of Steps: 2 Home Layout: One level Home Equipment: Teacher, English as a foreign language) Additional Comments: Tub Shower    Prior Function Level of Independence: Independent         Comments: Pt does report use of momentum to transfer to standing from any surface besides her lift chair.     Hand Dominance   Dominant Hand: Right    Extremity/Trunk Assessment               Lower Extremity Assessment: Generalized weakness         Communication   Communication: No difficulties  Cognition Arousal/Alertness: Awake/alert Behavior During Therapy: WFL for tasks assessed/performed Overall Cognitive Status: Within Functional Limits for tasks assessed                        Assessment/Plan    PT Assessment All further PT needs can be met in the next venue of care  PT Diagnosis Difficulty walking;Generalized weakness   PT Problem List Decreased strength;Decreased activity tolerance;Decreased mobility;Decreased balance  PT Treatment Interventions     PT Goals (Current goals can be found in the Care Plan section) Acute Rehab PT Goals PT Goal Formulation: No goals set, d/c therapy     End of Session Equipment Utilized During Treatment: Gait belt Activity Tolerance: Patient limited by fatigue;Other (comment)  (Limited by "tiredness" of (B) LE) Patient left: in bed;with call bell/phone within reach Nurse Communication: Mobility status    Functional Assessment Tool Used: Clinical Judgement Functional Limitation: Mobility: Walking and moving around Mobility: Walking and Moving Around Current Status (L8937): At least 1 percent but less than 20 percent impaired, limited or restricted Mobility: Walking and Moving Around Goal Status [phone]): 0 percent impaired, limited or restricted Mobility: Walking and Moving Around Discharge Status [phone]): At least 1 percent but less than 20 percent impaired, limited or restricted    Time: 0841-0855 PT Time Calculation (min): 14 min   Charges:   PT Evaluation $Initial PT Evaluation Tier I: 1 Procedure     PT G Codes:   Functional Assessment Tool Used: Clinical Judgement Functional Limitation: Mobility: Walking and moving around    [facility] 08/25/2014, 9:02 AM

## 2014-08-25 NOTE — Progress Notes (Signed)
Subjective: The patient remains alert and oriented. She was admitted following episode of vertigo. She had cystoscopy several days prior to admission had been having abdominal pain dysuria and decreased control of her bladder. She was diagnosed with interstitial cystitis by bladder biopsy.  Objective: Vital signs in last 24 hours: Temp:  [98 F (36.7 C)-98.6 F (37 C)] 98 F (36.7 C) (09/03 0605) Pulse Rate:  [68-84] 68 (09/03 0605) Resp:  [14-22] 18 (09/03 0605) BP: (110-142)/(53-81) 142/81 mmHg (09/03 0605) SpO2:  [89 %-97 %] 97 % (09/03 0605) Weight:  [95.255 kg (210 lb)] 95.255 kg (210 lb) (09/02 1502) Weight change:  Last BM Date: 08/24/14  Intake/Output from previous day:   Intake/Output this shift:    Physical Exam: General appearance patient appears alert and oriented  HEENT negative  Neck supple no JVD or thyroid abnormalities  Heart regular rhythm no murmurs  Lungs clear to P&A  Abdomen no palpable rectal masses but suprapubic tenderness  Neurological no focal abnormalities   Recent Labs  08/24/14 1541  WBC 12.6*  HGB 12.1  HCT 39.8  PLT 321   BMET  Recent Labs  08/24/14 1541  NA 140  K 4.9  CL 101  CO2 26  GLUCOSE 128*  BUN 13  CREATININE 0.77  CALCIUM 9.7    Studies/Results: Dg Chest 2 View  08/24/2014   CLINICAL DATA:  Near syncope.  Dizziness  EXAM: CHEST  2 VIEW  COMPARISON:  12/12/2013  FINDINGS: Mild cardiomegaly which is stable from prior. The patient has undergone CABG. Negative aortic contours.  There is no edema, consolidation, effusion, or pneumothorax. Mild scarring or atelectasis in the left upper lung.  Proximal right humerus ORIF and soft tissue anchoring.  IMPRESSION: No active cardiopulmonary disease.   Electronically Signed   By: Tiburcio Pea M.D.   On: 08/24/2014 16:14    Medications:  . sodium chloride   Intravenous STAT  . aspirin EC  325 mg Oral Daily  . atorvastatin  20 mg Oral q1800  . cefTRIAXone (ROCEPHIN)   IV  1 g Intravenous Q24H  . desipramine  10 mg Oral BID  . enoxaparin (LOVENOX) injection  40 mg Subcutaneous Q24H  . insulin aspart  0-15 Units Subcutaneous TID WC  . insulin aspart  0-5 Units Subcutaneous QHS  . insulin detemir  30 Units Subcutaneous Daily  . insulin detemir  50 Units Subcutaneous QHS  . levothyroxine  25 mcg Oral QAC breakfast  . lisinopril  10 mg Oral q morning - 10a  . sodium chloride  3 mL Intravenous Q12H  . zolpidem  5 mg Oral QHS    . sodium chloride 75 mL/hr at 08/24/14 2112     Assessment/Plan: 1. Interstitial cystitis-continue IV Rocephin  2. Diabetes mellitus-continue to monitor blood sugars continue basal and short-acting insulin   LOS: 1 day   Claud Gowan G 08/25/2014, 6:21 AM

## 2014-08-25 NOTE — Progress Notes (Signed)
Patient has a productive cough.  Requesting something for cough.  Dr. Renard Matter paged.  Returned page and gave order for patient to receive Hycodan every 4 hours as needed for cough.

## 2014-08-25 NOTE — Progress Notes (Signed)
UR Completed.  [phone]  

## 2014-08-25 NOTE — Care Management Note (Addendum)
    Page 1 of 2   08/31/2014     9:31:30 AM CARE MANAGEMENT NOTE 08/31/2014  Patient:  [name] [name], [name] [name]   Account Number:  [id number]  Date Initiated:  08/25/2014  Documentation initiated by:  Kathyrn Sheriff  Subjective/Objective Assessment:   Pt is from home with self care. Pt lives with husband. Pt has no HH services or medication needs prior to admission. Pt has a CPAP at home that she got from [facility].     Action/Plan:   Pt plans to discharge home with self care. Pt is agreeable to having HH PT. Alroy Bailiff, of [facility], per pt's choice, notifed of referral for [facility] PT. Pt notified HH will have 48 hours to see patient once discharged.   Anticipated DC Date:  08/31/2014   Anticipated DC Plan:  HOME W HOME HEALTH SERVICES      DC Planning Services  CM consult      [facility] Choice  HOME HEALTH   Choice offered to / List presented to:  C-1 Patient        HH arranged  HH-2 PT  HH-1 RN      [facility] agency  [facility]   Status of service:  Completed, signed off Medicare Important Message given?  YES (If response is "NO", the following Medicare IM given date fields will be blank) Date Medicare IM given:  08/26/2014 Medicare IM given by:  Kathyrn Sheriff Date Additional Medicare IM given:  08/31/2014 Additional Medicare IM given by:  Anibal Henderson  Discharge Disposition:  HOME [facility] SERVICES  Per UR Regulation:  Reviewed for med. necessity/level of care/duration of stay  If discussed at Long Length of Stay Meetings, dates discussed:   08/30/2014    Comments:  08/31/14  0915 Anibal Henderson RN/CM Pt being D/C home with a foley. HH to follow. 08/26/2014 1200 Kathyrn Sheriff, RN, MSN, PCCN 08/25/2014 1150 Kathyrn Sheriff, RN, MSN, [employer]

## 2014-08-26 DIAGNOSIS — Z91199 Patient's noncompliance with other medical treatment and regimen due to unspecified reason: Secondary | ICD-10-CM | POA: Diagnosis not present

## 2014-08-26 DIAGNOSIS — Z8673 Personal history of transient ischemic attack (TIA), and cerebral infarction without residual deficits: Secondary | ICD-10-CM | POA: Diagnosis not present

## 2014-08-26 DIAGNOSIS — N3941 Urge incontinence: Secondary | ICD-10-CM | POA: Diagnosis present

## 2014-08-26 DIAGNOSIS — G4733 Obstructive sleep apnea (adult) (pediatric): Secondary | ICD-10-CM | POA: Diagnosis present

## 2014-08-26 DIAGNOSIS — I5030 Unspecified diastolic (congestive) heart failure: Secondary | ICD-10-CM | POA: Diagnosis present

## 2014-08-26 DIAGNOSIS — R339 Retention of urine, unspecified: Secondary | ICD-10-CM | POA: Diagnosis not present

## 2014-08-26 DIAGNOSIS — I10 Essential (primary) hypertension: Secondary | ICD-10-CM | POA: Diagnosis present

## 2014-08-26 DIAGNOSIS — M129 Arthropathy, unspecified: Secondary | ICD-10-CM | POA: Diagnosis present

## 2014-08-26 DIAGNOSIS — A498 Other bacterial infections of unspecified site: Secondary | ICD-10-CM | POA: Diagnosis present

## 2014-08-26 DIAGNOSIS — Z9861 Coronary angioplasty status: Secondary | ICD-10-CM | POA: Diagnosis not present

## 2014-08-26 DIAGNOSIS — Z8249 Family history of ischemic heart disease and other diseases of the circulatory system: Secondary | ICD-10-CM | POA: Diagnosis not present

## 2014-08-26 DIAGNOSIS — R0902 Hypoxemia: Secondary | ICD-10-CM | POA: Diagnosis present

## 2014-08-26 DIAGNOSIS — I679 Cerebrovascular disease, unspecified: Secondary | ICD-10-CM | POA: Diagnosis present

## 2014-08-26 DIAGNOSIS — I509 Heart failure, unspecified: Secondary | ICD-10-CM | POA: Diagnosis present

## 2014-08-26 DIAGNOSIS — R55 Syncope and collapse: Secondary | ICD-10-CM | POA: Diagnosis present

## 2014-08-26 DIAGNOSIS — Z800 Family history of malignant neoplasm of digestive organs: Secondary | ICD-10-CM | POA: Diagnosis not present

## 2014-08-26 DIAGNOSIS — E669 Obesity, unspecified: Secondary | ICD-10-CM | POA: Diagnosis present

## 2014-08-26 DIAGNOSIS — Z951 Presence of aortocoronary bypass graft: Secondary | ICD-10-CM | POA: Diagnosis not present

## 2014-08-26 DIAGNOSIS — Z79899 Other long term (current) drug therapy: Secondary | ICD-10-CM | POA: Diagnosis not present

## 2014-08-26 DIAGNOSIS — I2510 Atherosclerotic heart disease of native coronary artery without angina pectoris: Secondary | ICD-10-CM | POA: Diagnosis present

## 2014-08-26 DIAGNOSIS — E119 Type 2 diabetes mellitus without complications: Secondary | ICD-10-CM | POA: Diagnosis present

## 2014-08-26 DIAGNOSIS — N3010 Interstitial cystitis (chronic) without hematuria: Secondary | ICD-10-CM | POA: Diagnosis present

## 2014-08-26 DIAGNOSIS — I252 Old myocardial infarction: Secondary | ICD-10-CM | POA: Diagnosis not present

## 2014-08-26 DIAGNOSIS — Z794 Long term (current) use of insulin: Secondary | ICD-10-CM | POA: Diagnosis not present

## 2014-08-26 DIAGNOSIS — N139 Obstructive and reflux uropathy, unspecified: Secondary | ICD-10-CM | POA: Diagnosis present

## 2014-08-26 DIAGNOSIS — E039 Hypothyroidism, unspecified: Secondary | ICD-10-CM | POA: Diagnosis present

## 2014-08-26 DIAGNOSIS — J449 Chronic obstructive pulmonary disease, unspecified: Secondary | ICD-10-CM | POA: Diagnosis present

## 2014-08-26 DIAGNOSIS — E785 Hyperlipidemia, unspecified: Secondary | ICD-10-CM | POA: Diagnosis present

## 2014-08-26 DIAGNOSIS — Z6836 Body mass index (BMI) 36.0-36.9, adult: Secondary | ICD-10-CM | POA: Diagnosis not present

## 2014-08-26 DIAGNOSIS — K5900 Constipation, unspecified: Secondary | ICD-10-CM | POA: Diagnosis present

## 2014-08-26 LAB — GLUCOSE, CAPILLARY
GLUCOSE-CAPILLARY: 214 mg/dL — AB (ref 70–99)
GLUCOSE-CAPILLARY: 168 mg/dL — AB (ref 70–99)
GLUCOSE-CAPILLARY: 278 mg/dL — AB (ref 70–99)
Glucose-Capillary: 109 mg/dL — ABNORMAL HIGH (ref 70–99)

## 2014-08-26 MED ORDER — OXYCODONE-ACETAMINOPHEN 5-325 MG PO TABS
1.0000 | ORAL_TABLET | ORAL | Status: DC | PRN
  Administered 2014-08-26 – 2014-08-31 (×10): 1 via ORAL
  Filled 2014-08-26 (×10): qty 1

## 2014-08-26 MED ORDER — PHENAZOPYRIDINE HCL 100 MG PO TABS
100.0000 mg | ORAL_TABLET | Freq: Three times a day (TID) | ORAL | Status: DC
  Administered 2014-08-26 – 2014-08-31 (×16): 100 mg via ORAL
  Filled 2014-08-26 (×16): qty 1

## 2014-08-26 MED ORDER — BISACODYL 10 MG RE SUPP
10.0000 mg | Freq: Once | RECTAL | Status: AC
  Administered 2014-08-26: 10 mg via RECTAL
  Filled 2014-08-26: qty 1

## 2014-08-26 NOTE — Progress Notes (Addendum)
Inpatient Diabetes Program Recommendations  AACE/ADA: New Consensus Statement on Inpatient Glycemic Control (2013)  Target Ranges:  Prepandial:   less than 140 mg/dL      Peak postprandial:   less than 180 mg/dL (1-2 hours)      Critically ill patients:  140 - 180 mg/dL   Results for [name] [name], [name] [name] (MRN [id number]) as of 08/26/2014 08:44  Ref. Range 08/25/2014 07:18 08/25/2014 11:11 08/25/2014 16:23 08/25/2014 21:58 08/26/2014 08:07  Glucose-Capillary Latest Range: 70-99 mg/dL 914 (H) 782 (H) 956 (H) 245 (H) 109 (H)   Diabetes history: DM2 Outpatient Diabetes medications: Levemir 30 units QAM, Levemir 70 units QHS, Humalog 20-25 units TID, Metformin 500 BID Current orders for Inpatient glycemic control:  Levemir 50 units QHS, Levemir 30 units QAM, Novolog 0-15 units AC, 0-5 units HS  Inpatient Diabetes Program Recommendations Insulin - Basal: Please consider increasing am dose of Levemir to 33 untis QAM.   Thanks, Orlando Penner, RN, MSN, CCRN Diabetes Coordinator Inpatient Diabetes Program [phone] (Team Pager) [phone] ([place] office) [phone] [facility] office)

## 2014-08-26 NOTE — Progress Notes (Signed)
Subjective:   Objective: Vital signs in last 24 hours: Temp:  [98 F (36.7 C)-98.2 F (36.8 C)] 98 F (36.7 C) (09/03 2200) Pulse Rate:  [72-79] 72 (09/03 2200) Resp:  [18] 18 (09/03 2200) BP: (133-152)/(63-83) 133/83 mmHg (09/03 2200) SpO2:  [83 %-95 %] 93 % (09/03 2200) Weight change:  Last BM Date: 08/23/14  Intake/Output from previous day: 09/03 0701 - 09/04 0700 In: 3063.8 [P.O.:1380; I.V.:1633.8; IV Piggyback:50] Out: -  Intake/Output this shift: Total I/O In: 660 [P.O.:660] Out: -   Physical Exam:    Recent Labs  08/24/14 1541  WBC 12.6*  HGB 12.1  HCT 39.8  PLT 321   BMET  Recent Labs  08/24/14 1541  NA 140  K 4.9  CL 101  CO2 26  GLUCOSE 128*  BUN 13  CREATININE 0.77  CALCIUM 9.7    Studies/Results: Dg Chest 2 View  08/24/2014   CLINICAL DATA:  Near syncope.  Dizziness  EXAM: CHEST  2 VIEW  COMPARISON:  12/12/2013  FINDINGS: Mild cardiomegaly which is stable from prior. The patient has undergone CABG. Negative aortic contours.  There is no edema, consolidation, effusion, or pneumothorax. Mild scarring or atelectasis in the left upper lung.  Proximal right humerus ORIF and soft tissue anchoring.  IMPRESSION: No active cardiopulmonary disease.   Electronically Signed   By: Tiburcio Pea M.D.   On: 08/24/2014 16:14    Medications:  . aspirin EC  325 mg Oral Daily  . atorvastatin  20 mg Oral q1800  . cefTRIAXone (ROCEPHIN)  IV  1 g Intravenous Q24H  . desipramine  12.5 mg Oral BID  . enoxaparin (LOVENOX) injection  40 mg Subcutaneous Q24H  . insulin aspart  0-15 Units Subcutaneous TID WC  . insulin aspart  0-5 Units Subcutaneous QHS  . insulin detemir  30 Units Subcutaneous Daily  . insulin detemir  50 Units Subcutaneous QHS  . levothyroxine  25 mcg Oral QAC breakfast  . lisinopril  10 mg Oral q morning - 10a  . phenazopyridine  100 mg Oral TID WC  . sodium chloride  3 mL Intravenous Q12H  . zolpidem  5 mg Oral QHS    . sodium chloride 75  mL/hr at 08/25/14 2306     Assessment/Plan:    LOS: 2 days   Aiya Keach G 08/26/2014, 6:36 AM

## 2014-08-26 NOTE — Progress Notes (Signed)
Subjective: The patient complains or urinary discomfort. She did have Escherichia coli cultured from urine. She also complains of constipation her blood sugars are running from 171-295 Objective: Vital signs in last 24 hours: Temp:  [98 F (36.7 C)-98.2 F (36.8 C)] 98 F (36.7 C) (09/03 2200) Pulse Rate:  [72-79] 72 (09/03 2200) Resp:  [18] 18 (09/03 2200) BP: (133-152)/(63-83) 133/83 mmHg (09/03 2200) SpO2:  [83 %-95 %] 93 % (09/03 2200) Weight change:  Last BM Date: 08/23/14  Intake/Output from previous day: 09/03 0701 - 09/04 0700 In: 3063.8 [P.O.:1380; I.V.:1633.8; IV Piggyback:50] Out: -  Intake/Output this shift: Total I/O In: 660 [P.O.:660] Out: -   Physical Exam: The patient is alert and oriented  HEENT negative  Neck supple no JVD or thyroid abnormalities  Heart regular rhythm no murmurs  Lungs clear to P&A  Abdomen no palpable organs or masses but tenderness over suprapubic area  Neurological no focal deficit   Recent Labs  08/24/14 1541  WBC 12.6*  HGB 12.1  HCT 39.8  PLT 321   BMET  Recent Labs  08/24/14 1541  NA 140  K 4.9  CL 101  CO2 26  GLUCOSE 128*  BUN 13  CREATININE 0.77  CALCIUM 9.7    Studies/Results: Dg Chest 2 View  08/24/2014   CLINICAL DATA:  Near syncope.  Dizziness  EXAM: CHEST  2 VIEW  COMPARISON:  12/12/2013  FINDINGS: Mild cardiomegaly which is stable from prior. The patient has undergone CABG. Negative aortic contours.  There is no edema, consolidation, effusion, or pneumothorax. Mild scarring or atelectasis in the left upper lung.  Proximal right humerus ORIF and soft tissue anchoring.  IMPRESSION: No active cardiopulmonary disease.   Electronically Signed   By: Tiburcio Pea M.D.   On: 08/24/2014 16:14    Medications:  . aspirin EC  325 mg Oral Daily  . atorvastatin  20 mg Oral q1800  . cefTRIAXone (ROCEPHIN)  IV  1 g Intravenous Q24H  . desipramine  12.5 mg Oral BID  . enoxaparin (LOVENOX) injection  40 mg  Subcutaneous Q24H  . insulin aspart  0-15 Units Subcutaneous TID WC  . insulin aspart  0-5 Units Subcutaneous QHS  . insulin detemir  30 Units Subcutaneous Daily  . insulin detemir  50 Units Subcutaneous QHS  . levothyroxine  25 mcg Oral QAC breakfast  . lisinopril  10 mg Oral q morning - 10a  . phenazopyridine  100 mg Oral TID WC  . sodium chloride  3 mL Intravenous Q12H  . zolpidem  5 mg Oral QHS    . sodium chloride 75 mL/hr at 08/25/14 2306     Assessment/Plan: 1. Urinary tract infection secondary to Escherichia coli-prior urinary bladder biopsy-interstitial cystitis-continue IV Rocephin and Pyridium 2. Diabetes mellitus with fair control-plan to continue current regimen of basal insulin with supplementary short-acting insulin   LOS: 2 days   [name] [name] G 08/26/2014, 6:36 AM

## 2014-08-27 LAB — GLUCOSE, CAPILLARY
GLUCOSE-CAPILLARY: 174 mg/dL — AB (ref 70–99)
GLUCOSE-CAPILLARY: 220 mg/dL — AB (ref 70–99)
GLUCOSE-CAPILLARY: 207 mg/dL — AB (ref 70–99)
Glucose-Capillary: 269 mg/dL — ABNORMAL HIGH (ref 70–99)

## 2014-08-27 MED ORDER — HYDROCORTISONE 1 % EX CREA
TOPICAL_CREAM | Freq: Two times a day (BID) | CUTANEOUS | Status: DC
  Filled 2014-08-27: qty 28

## 2014-08-27 MED ORDER — HYDROCORTISONE 1 % EX CREA: TOPICAL_CREAM | Freq: Two times a day (BID) | CUTANEOUS | Status: DC

## 2014-08-27 MED ORDER — HYDROCORTISONE 1 % EX CREA
TOPICAL_CREAM | Freq: Two times a day (BID) | CUTANEOUS | Status: DC
  Administered 2014-08-27 – 2014-08-29 (×6): via TOPICAL
  Administered 2014-08-30: 1 via TOPICAL
  Administered 2014-08-30 – 2014-08-31 (×2): via TOPICAL
  Filled 2014-08-27: qty 28

## 2014-08-27 NOTE — Progress Notes (Signed)
Subjective: The patient is feeling fairly comfortable as morning. She still has burning sensation and bladder area. Her blood sugars are running from 214-278. Urine culture showed no growth.  Objective: Vital signs in last 24 hours: Temp:  [97.8 F (36.6 C)-98.1 F (36.7 C)] 98 F (36.7 C) (09/05 0555) Pulse Rate:  [73-77] 77 (09/05 0555) Resp:  [11-20] 11 (09/05 0555) BP: (130-184)/(52-68) 130/61 mmHg (09/05 0555) SpO2:  [92 %-94 %] 92 % (09/05 0555) Weight change:  Last BM Date: 08/26/14  Intake/Output from previous day: 09/04 0701 - 09/05 0700 In: 240 [P.O.:240] Out: -  Intake/Output this shift:    Physical Exam: General appearance alert oriented female in no distress  H. EENT negative  Neck supple no JVD or thyroid abnormalities  Heart regular rhythm no murmurs  Lungs clear to P&A  Abdomen no palpable organs or masses  Extremities free of edema   Recent Labs  08/24/14 1541  WBC 12.6*  HGB 12.1  HCT 39.8  PLT 321   BMET  Recent Labs  08/24/14 1541  NA 140  K 4.9  CL 101  CO2 26  GLUCOSE 128*  BUN 13  CREATININE 0.77  CALCIUM 9.7    Studies/Results: No results found.  Medications:  . aspirin EC  325 mg Oral Daily  . atorvastatin  20 mg Oral q1800  . cefTRIAXone (ROCEPHIN)  IV  1 g Intravenous Q24H  . desipramine  12.5 mg Oral BID  . enoxaparin (LOVENOX) injection  40 mg Subcutaneous Q24H  . hydrocortisone cream   Topical BID  . insulin aspart  0-15 Units Subcutaneous TID WC  . insulin aspart  0-5 Units Subcutaneous QHS  . insulin detemir  30 Units Subcutaneous Daily  . insulin detemir  50 Units Subcutaneous QHS  . levothyroxine  25 mcg Oral QAC breakfast  . lisinopril  10 mg Oral q morning - 10a  . phenazopyridine  100 mg Oral TID WC  . sodium chloride  3 mL Intravenous Q12H  . zolpidem  5 mg Oral QHS    . sodium chloride 75 mL/hr at 08/25/14 2306     Assessment/Plan: 1. Interstitial cystitis urinary tract infection plan to  continue IV Rocephin  2. Diabetes mellitus2 insulin-dependent plan to continue current the insulin dosage with sliding scale coverage-plantigrade to ambulate patient   LOS: 3 days   [name] [name] G 08/27/2014, 7:26 AM

## 2014-08-27 NOTE — Progress Notes (Signed)
Subjective: The patient is feeling some better today but still has her bladder discomfort when voiding  Objective: Vital signs in last 24 hours: Temp:  [97.8 F (36.6 C)-98.1 F (36.7 C)] 98 F (36.7 C) (09/05 0555) Pulse Rate:  [73-77] 77 (09/05 0555) Resp:  [11-20] 11 (09/05 0555) BP: (130-184)/(52-68) 130/61 mmHg (09/05 0555) SpO2:  [92 %-94 %] 92 % (09/05 0555) Weight change:  Last BM Date: 08/26/14  Intake/Output from previous day: 09/04 0701 - 09/05 0700 In: 240 [P.O.:240] Out: -  Intake/Output this shift:    Physical Exam: General appearance alert female  HEENT negative  Neck supple no JVD or thyroid abnormalities  Lungs clear to P&A  Heart regular rhythm no murmurs  Abdomen no palpable organs or masses slight tenderness over bladder   Recent Labs  08/24/14 1541  WBC 12.6*  HGB 12.1  HCT 39.8  PLT 321   BMET  Recent Labs  08/24/14 1541  NA 140  K 4.9  CL 101  CO2 26  GLUCOSE 128*  BUN 13  CREATININE 0.77  CALCIUM 9.7    Studies/Results: No results found.  Medications:  . aspirin EC  325 mg Oral Daily  . atorvastatin  20 mg Oral q1800  . cefTRIAXone (ROCEPHIN)  IV  1 g Intravenous Q24H  . desipramine  12.5 mg Oral BID  . enoxaparin (LOVENOX) injection  40 mg Subcutaneous Q24H  . insulin aspart  0-15 Units Subcutaneous TID WC  . insulin aspart  0-5 Units Subcutaneous QHS  . insulin detemir  30 Units Subcutaneous Daily  . insulin detemir  50 Units Subcutaneous QHS  . levothyroxine  25 mcg Oral QAC breakfast  . lisinopril  10 mg Oral q morning - 10a  . phenazopyridine  100 mg Oral TID WC  . sodium chloride  3 mL Intravenous Q12H  . zolpidem  5 mg Oral QHS    . sodium chloride 75 mL/hr at 08/25/14 2306     Assessment/Plan:    LOS: 3 days   Sarita Hakanson G 08/27/2014, 6:55 AM

## 2014-08-28 ENCOUNTER — Inpatient Hospital Stay (HOSPITAL_COMMUNITY): Payer: Medicare Other

## 2014-08-28 LAB — URINALYSIS, ROUTINE W REFLEX MICROSCOPIC
Bilirubin Urine: NEGATIVE
GLUCOSE, UA: 250 mg/dL — AB
KETONES UR: NEGATIVE mg/dL
Nitrite: POSITIVE — AB
PH: 5 (ref 5.0–8.0)
Specific Gravity, Urine: 1.02 (ref 1.005–1.030)
Urobilinogen, UA: 1 mg/dL (ref 0.0–1.0)

## 2014-08-28 LAB — GLUCOSE, CAPILLARY
Glucose-Capillary: 233 mg/dL — ABNORMAL HIGH (ref 70–99)
Glucose-Capillary: 179 mg/dL — ABNORMAL HIGH (ref 70–99)
Glucose-Capillary: 200 mg/dL — ABNORMAL HIGH (ref 70–99)
Glucose-Capillary: 212 mg/dL — ABNORMAL HIGH (ref 70–99)

## 2014-08-28 LAB — URINE MICROSCOPIC-ADD ON

## 2014-08-28 MED ORDER — ALBUTEROL SULFATE (2.5 MG/3ML) 0.083% IN NEBU
2.5000 mg | INHALATION_SOLUTION | Freq: Four times a day (QID) | RESPIRATORY_TRACT | Status: DC
  Administered 2014-08-28 (×3): 2.5 mg via RESPIRATORY_TRACT
  Filled 2014-08-28 (×3): qty 3

## 2014-08-28 MED ORDER — DIPHENHYDRAMINE HCL 50 MG/ML IJ SOLN
12.5000 mg | Freq: Four times a day (QID) | INTRAMUSCULAR | Status: DC | PRN
  Administered 2014-08-30 – 2014-08-31 (×2): 12.5 mg via INTRAVENOUS
  Filled 2014-08-28 (×2): qty 1

## 2014-08-28 MED ORDER — HYDROMORPHONE HCL PF 1 MG/ML IJ SOLN
1.0000 mg | INTRAMUSCULAR | Status: DC | PRN
  Administered 2014-08-28 – 2014-08-30 (×7): 1 mg via INTRAVENOUS
  Filled 2014-08-28 (×7): qty 1

## 2014-08-28 NOTE — Progress Notes (Signed)
Patient continually getting up to void.  Patient is voiding every 15 minutes, and is becoming fatigued from constant voiding. Patient also is congested and gets short of breath every time she is getting up. Patient is very frustrated with self over urination and not being able to reach bsc in time to void. Called md on call and received order for catheter for tonight.

## 2014-08-28 NOTE — Progress Notes (Signed)
Subjective: The patient had a restless night. She did develop some cough and wheezing and mild respiratory distress. She continues to complain of dysuria  Objective: Vital signs in last 24 hours: Temp:  [98.1 F (36.7 C)-98.7 F (37.1 C)] 98.7 F (37.1 C) (09/06 0455) Pulse Rate:  [69-79] 70 (09/06 0455) Resp:  [16-20] 20 (09/06 0455) BP: (125-170)/(59-88) 132/60 mmHg (09/06 0455) SpO2:  [91 %-97 %] 92 % (09/06 0612) Weight change:  Last BM Date: 08/26/14  Intake/Output from previous day: 09/05 0701 - 09/06 0700 In: 4053.8 [P.O.:480; I.V.:3523.8; IV Piggyback:50] Out: -  Intake/Output this shift:    Physical Exam: General appearance the patient is alert and oriented in mild respiratory distress  H. EENT negative  Neck supple no JVD or thyroid abnormalities  Lungs occasional rhonchus heard over lower lung field  Heart regular rhythm no murmurs  Abdomen the palpable organs or masses slight tenderness over bladder  Extremities free of edema  No results found for this basename: WBC, HGB, HCT, PLT,  in the last 72 hours BMET No results found for this basename: NA, K, CL, CO2, GLUCOSE, BUN, CREATININE, CALCIUM,  in the last 72 hours  Studies/Results: No results found.  Medications:  . aspirin EC  325 mg Oral Daily  . atorvastatin  20 mg Oral q1800  . cefTRIAXone (ROCEPHIN)  IV  1 g Intravenous Q24H  . desipramine  12.5 mg Oral BID  . enoxaparin (LOVENOX) injection  40 mg Subcutaneous Q24H  . hydrocortisone cream   Topical BID  . insulin aspart  0-15 Units Subcutaneous TID WC  . insulin aspart  0-5 Units Subcutaneous QHS  . insulin detemir  30 Units Subcutaneous Daily  . insulin detemir  50 Units Subcutaneous QHS  . levothyroxine  25 mcg Oral QAC breakfast  . lisinopril  10 mg Oral q morning - 10a  . phenazopyridine  100 mg Oral TID WC  . sodium chloride  3 mL Intravenous Q12H  . zolpidem  5 mg Oral QHS    . sodium chloride 75 mL/hr at 08/28/14 0350      Assessment/Plan: 1. Interstitial cystitis-plan to continue current IV Rocephin-Dilaudid added  2. Diabetes mellitus continue current regimen  3. Bronchitis and upper respiratory symptoms-obtain chest x-ray and start neb treatments   LOS: 4 days   Albert Devaul G 08/28/2014, 7:23 AM

## 2014-08-28 NOTE — Progress Notes (Signed)
Patient observed with wheezing and congested cough. Patient also complained of bladder pain without relief from Oxycodone. Dr. Renard Matter in facility and notified. New orders to change Neb treatment to scheduled QID, repeat chest xray, Dilaudid  q4hrs as needed, and obtain an urinalysis.

## 2014-08-29 LAB — GLUCOSE, CAPILLARY
GLUCOSE-CAPILLARY: 169 mg/dL — AB (ref 70–99)
Glucose-Capillary: 243 mg/dL — ABNORMAL HIGH (ref 70–99)
Glucose-Capillary: 248 mg/dL — ABNORMAL HIGH (ref 70–99)
Glucose-Capillary: 233 mg/dL — ABNORMAL HIGH (ref 70–99)

## 2014-08-29 LAB — URINALYSIS, ROUTINE W REFLEX MICROSCOPIC
Bilirubin Urine: NEGATIVE
Glucose, UA: >1000 mg/dL — AB
Ketones, ur: NEGATIVE mg/dL
NITRITE: POSITIVE — AB
Protein, ur: NEGATIVE mg/dL
SPECIFIC GRAVITY, URINE: 1.01 (ref 1.005–1.030)
UROBILINOGEN UA: 0.2 mg/dL (ref 0.0–1.0)
pH: 5.5 (ref 5.0–8.0)

## 2014-08-29 LAB — URINE MICROSCOPIC-ADD ON

## 2014-08-29 LAB — PRO B NATRIURETIC PEPTIDE: PRO B NATRI PEPTIDE: 399.8 pg/mL (ref 0–450)

## 2014-08-29 MED ORDER — ALBUTEROL SULFATE (2.5 MG/3ML) 0.083% IN NEBU
2.5000 mg | INHALATION_SOLUTION | RESPIRATORY_TRACT | Status: DC
  Administered 2014-08-29 – 2014-08-31 (×12): 2.5 mg via RESPIRATORY_TRACT
  Filled 2014-08-29 (×12): qty 3

## 2014-08-29 MED ORDER — DOCUSATE SODIUM 100 MG PO CAPS
100.0000 mg | ORAL_CAPSULE | Freq: Two times a day (BID) | ORAL | Status: DC
  Administered 2014-08-29 – 2014-08-31 (×4): 100 mg via ORAL
  Filled 2014-08-29 (×4): qty 1

## 2014-08-29 MED ORDER — LEVOFLOXACIN IN D5W 750 MG/150ML IV SOLN
750.0000 mg | INTRAVENOUS | Status: DC
  Administered 2014-08-29 – 2014-08-30 (×2): 750 mg via INTRAVENOUS
  Filled 2014-08-29 (×3): qty 150

## 2014-08-29 NOTE — Progress Notes (Signed)
Notified Dr. Renard Matter of placement of foley and removal of foley.  Informed him of recent cystoscopy, and foley was placed because of urinary retention.  Orders received and taken. Will pass on to next nurse.

## 2014-08-29 NOTE — Progress Notes (Signed)
Patient has had 3 positive UAs this week. Lab states urine culture on 9/2 was negative. UA today positive. Paged Dr Randol Kern. Repeat Urine culture and change Rocephin to Levaquin. Patient has been on Pyridium which may have affected results.

## 2014-08-29 NOTE — Progress Notes (Signed)
Subjective: The patient went into urinary retention during the night. And had to be catheterized. 600 cc of urine was obtained and catheter was subsequently removed. The patient continues to have pain with urination. She also continues to cough. This is productive cough  Objective: Vital signs in last 24 hours: Temp:  [98 F (36.7 C)-98.3 F (36.8 C)] 98.3 F (36.8 C) (09/07 0425) Pulse Rate:  [69-79] 79 (09/07 0425) Resp:  [18-20] 20 (09/07 0425) BP: (132-152)/(58-65) 152/65 mmHg (09/07 0425) SpO2:  [87 %-98 %] 98 % (09/07 0425) Weight change:  Last BM Date: 08/26/14  Intake/Output from previous day: 09/06 0701 - 09/07 0700 In: 1600 [P.O.:720; I.V.:830; IV Piggyback:50] Out: 3350 [Urine:3350] Intake/Output this shift: Total I/O In: -  Out: 1350 [Urine:1350]  Physical Exam: General appearance alert female complaining of bladder pain  H. EENT negative  Neck supple no JVD or thyroid abnormalities  Heart regular rhythm no murmurs  Lungs occasional rhonchus heard over lower lung field  Abdomen no palpable organs or masses tenderness over bladder  No results found for this basename: WBC, HGB, HCT, PLT,  in the last 72 hours BMET No results found for this basename: NA, K, CL, CO2, GLUCOSE, BUN, CREATININE, CALCIUM,  in the last 72 hours  Studies/Results: Dg Chest 2 View  08/28/2014   CLINICAL DATA:  History of diabetes, CAD, now with wheezing following bladder procedure  EXAM: CHEST  2 VIEW  COMPARISON:  08/24/2014; 12/12/2013  FINDINGS: Grossly unchanged borderline enlarged cardiac silhouette and mediastinal contours post median sternotomy and CABG. Mild cephalization of flow without frank evidence of edema. No discrete focal airspace opacities. No pleural effusion or pneumothorax. No evidence of edema. There is mild elevation / eventration of the right hemidiaphragm. Unchanged bones including sequela of right-sided rotator cuff repair and sideplate fixation of the proximal  right humeral metadiaphysis, incompletely imaged. Post cholecystectomy.  IMPRESSION: Mild pulmonary venous congestion without frank evidence of edema.   Electronically Signed   By: Simonne Come M.D.   On: 08/28/2014 08:18    Medications:  . albuterol  2.5 mg Nebulization Q4H WA  . aspirin EC  325 mg Oral Daily  . atorvastatin  20 mg Oral q1800  . cefTRIAXone (ROCEPHIN)  IV  1 g Intravenous Q24H  . desipramine  12.5 mg Oral BID  . enoxaparin (LOVENOX) injection  40 mg Subcutaneous Q24H  . hydrocortisone cream   Topical BID  . insulin aspart  0-15 Units Subcutaneous TID WC  . insulin aspart  0-5 Units Subcutaneous QHS  . insulin detemir  30 Units Subcutaneous Daily  . insulin detemir  50 Units Subcutaneous QHS  . levothyroxine  25 mcg Oral QAC breakfast  . lisinopril  10 mg Oral q morning - 10a  . phenazopyridine  100 mg Oral TID WC  . sodium chloride  3 mL Intravenous Q12H  . zolpidem  5 mg Oral QHS    . sodium chloride 75 mL/hr at 08/28/14 1751     Assessment/Plan: 1. Interstitial cystitis plan to continue current IV Rocephin-will attempt to get urology consult with [facility] neurologist  2. Diabetes mellitus plan to continue current monitoring of blood sugars continue current regimen.  3. Bronchitis-continue nebulizer treatments continue current antibiotic regimen which is Rocephin   LOS: 5 days   Hatsumi Steinhart G 08/29/2014, 6:57 AM

## 2014-08-30 LAB — GLUCOSE, CAPILLARY
GLUCOSE-CAPILLARY: 159 mg/dL — AB (ref 70–99)
GLUCOSE-CAPILLARY: 233 mg/dL — AB (ref 70–99)
Glucose-Capillary: 268 mg/dL — ABNORMAL HIGH (ref 70–99)
Glucose-Capillary: 208 mg/dL — ABNORMAL HIGH (ref 70–99)

## 2014-08-30 MED ORDER — HYDROMORPHONE HCL PF 1 MG/ML IJ SOLN
1.0000 mg | INTRAMUSCULAR | Status: DC | PRN
  Administered 2014-08-30 – 2014-08-31 (×5): 1 mg via INTRAVENOUS
  Filled 2014-08-30 (×5): qty 1

## 2014-08-30 MED ORDER — FLEET ENEMA 7-19 GM/118ML RE ENEM
1.0000 | ENEMA | Freq: Once | RECTAL | Status: AC
  Administered 2014-08-30: 1 via RECTAL

## 2014-08-30 MED ORDER — LOSARTAN POTASSIUM 50 MG PO TABS
50.0000 mg | ORAL_TABLET | Freq: Every day | ORAL | Status: DC
  Administered 2014-08-30 – 2014-08-31 (×2): 50 mg via ORAL
  Filled 2014-08-30: qty 1

## 2014-08-30 NOTE — Progress Notes (Addendum)
Inpatient Diabetes Program Recommendations  AACE/ADA: New Consensus Statement on Inpatient Glycemic Control (2013)  Target Ranges:  Prepandial:   less than 140 mg/dL      Peak postprandial:   less than 180 mg/dL (1-2 hours)      Critically ill patients:  140 - 180 mg/dL   Results for VYLA, PINT (MRN [id number]) as of 08/30/2014 09:52  Ref. Range 08/29/2014 07:36 08/29/2014 11:16 08/29/2014 16:56 08/29/2014 22:22 08/30/2014 07:04  Glucose-Capillary Latest Range: 70-99 mg/dL 409 (H) 811 (H) 914 (H) 233 (H) 159 (H)   Diabetes history: DM2  Outpatient Diabetes medications: Levemir 30 units QAM, Levemir 70 units QHS, Humalog 20-25 units TID, Metformin 500 BID  Current orders for Inpatient glycemic control: Levemir 50 units QHS, Levemir 30 units QAM, Novolog 0-15 units AC, 0-5 units HS  Inpatient Diabetes Program Recommendations Insulin - Meal Coverage: Post prandial glucose consistently elevated. Please consider ordering Novolog 6 units TID with meals for meal coverage.  Thanks, Orlando Penner, RN, MSN, CCRN Diabetes Coordinator Inpatient Diabetes Program [phone] (Team Pager) [phone] ([place] office) [phone] [facility] office)

## 2014-08-30 NOTE — Consult Note (Signed)
Urology Consult   Physician requesting consult: McGinness  Reason for consult: Bladder problems  History of Present Illness: [name] [name] is a 78 y.o. female who is known to our practice-she has seen both doctors Wrenn and McDiarmid for lower urinary tract symptoms, as well as a recent biopsy for an inflammatory lesion of the bladder. She has had urodynamics. She has a slow stream, an incomplete emptying. Baseline, she has frequency, urgency and urgency incontinence. She has been on anti-muscarinic therapy in the past. Recent cystoscopy, HOD and biopsy of the bladder revealed the patient have a capacity is normal at 400 mL. Biopsy was negative for urothelial malignancy.  The patient had worsened urinary incontinence since the procedure. She was recently admitted for vertigo, and was found to have recently a large residual urine volume (600 mL). A catheter was placed temporarily, but was removed. I do not know why the patient's catheter was removed, with a high residual. She is now incontinent, and has been need for a diaper.   Initial urine culture from 08/24/2014 was negative. She is currently on Rocephin. A culture from 08/29/2014 is pending.    Past Medical History  Diagnosis Date  . Arteriosclerotic cardiovascular disease (ASCVD) CARDIOLOGIST-   DR Dietrich Pates     CABG surgery in 2001; nl stress nuclear-2007; angiography in 01/2006- TO of LAD; 70% ostial stenosis      of a small ramus; patent grafts; normal EF; cath in 12/2009 essentially unchanged; EF-66%;  Congestive heart failure with preserved LV systolic function  . Hypertension   . Varicose veins   . Hypothyroidism   . Insulin dependent type 2 diabetes mellitus   . Peripheral neuropathy   . Diastolic CHF   . Hyperlipidemia   . History of CVA (cerebrovascular accident)     2013--  LACUNAR INFARCTIONS RIGHT THALAMUS---  residual left side of  lip numb  and left eye vision worse (which corrented lens implant from cataract extraction)  .  Lesion of bladder   . Hematuria   . Arthritis   . DDD (degenerative disc disease), lumbar   . OSA (obstructive sleep apnea)     NON-COMPLIANT CPAP  . Chronic obstructive pulmonary disease     Chronic bronchitis  . History of MI (myocardial infarction)     07/ 2001   s/p  cabg x2  . S/P CABG x 2     07-02-2000  . SUI (stress urinary incontinence, female)   . Lower urinary tract symptoms (LUTS)   . Psoriasis     lower leg  . Thinning of skin     Past Surgical History  Procedure Laterality Date  . Rotator cuff repair Right 1995  . Lumbar spine surgery  x2    yrs ago  . Carpal tunnel release Bilateral yrs ago  . Orif humerus fracture Right 04-18-2008  . Knee arthroscopy Left 10-18-2003    SYNOVECTOMY  . Repair flexor tendon hand Right 09-20-2008    CARPI RADIALIS TENDON TRANSFER TO EXTENSOR TO INDEX, MIDDLE, RING , LITTLE FINGERS AND DIGITI MININI  . Cardiovascular stress test  08-28-2011   DR ROTHBART    NEGATIVE NUCLEAR STUDY/  GLOBAL LVSF/  EF 65%  . Cholecystectomy  1990  . Appendectomy  age 43  . Total abdominal hysterectomy w/ bilateral salpingoophorectomy  age 44  . Cataract extraction w/ intraocular lens  implant, bilateral  2013  . Cardiac catheterization  1991    No sig. cad  . Coronary angioplasty  11-13-1999  balloon angioplasty to mLAD , d2 of LAD  . Coronary angioplasty with stent placement  01-17-2000    PCI stenting to mLAD & D2 of LAD  . Cardiac catheterization  07-01-2000    high grade in-stent restenosis  . Cardiac catheterization  04-30-2001    single native vessel cad/ graft patent  . Cardiac catheterization  02-13-2006  dr Juanda Chance    2V cad with total occluded LAD  and  70% ostial of small ramus/  grafts patent/  normal ef  . Cardiac catheterization  01-20-2000  dr Clifton James    essentially unchanged;  severe single vessel cad with diffuse disease throughout CFX and RCA/ ef 66%;  chf with preserved LVSF  . Coronary artery bypass graft  07-02-2000   DR  Tressie Stalker    SVG to diagonal, LIMA to LAD  . Transthoracic echocardiogram  12-12-2013    MILD LVH/  EF 60-65%/  GRADE I DIASTOLIC DYSFUNCTION/  MILD LAE  . Cystoscopy w/ retrogrades Bilateral 08/09/2014    Procedure: CYSTOSCOPY, BILATERAL RETROGRADE, HYDRODISTENSION, BLADDER BIOPSY WITH FULGERATION, INSTILL PYRIDIUM AND MARCAINE ;  Surgeon: Anner Crete, MD;  Location: [facility];  Service: Urology;  Laterality: Bilateral;     Current Hospital Medications: Scheduled Meds: . albuterol  2.5 mg Nebulization Q4H WA  . aspirin EC  325 mg Oral Daily  . atorvastatin  20 mg Oral q1800  . desipramine  12.5 mg Oral BID  . docusate sodium  100 mg Oral BID  . enoxaparin (LOVENOX) injection  40 mg Subcutaneous Q24H  . hydrocortisone cream   Topical BID  . insulin aspart  0-15 Units Subcutaneous TID WC  . insulin aspart  0-5 Units Subcutaneous QHS  . insulin detemir  30 Units Subcutaneous Daily  . insulin detemir  50 Units Subcutaneous QHS  . levofloxacin (LEVAQUIN) IV  750 mg Intravenous Q24H  . levothyroxine  25 mcg Oral QAC breakfast  . losartan  50 mg Oral Daily  . phenazopyridine  100 mg Oral TID WC  . sodium chloride  3 mL Intravenous Q12H  . zolpidem  5 mg Oral QHS   Continuous Infusions: . sodium chloride 75 mL/hr at 08/30/14 0203   PRN Meds:.acetaminophen, acetaminophen, albuterol, diphenhydrAMINE, HYDROcodone-homatropine, HYDROmorphone (DILAUDID) injection, LORazepam, nitroGLYCERIN, ondansetron (ZOFRAN) IV, ondansetron, oxyCODONE-acetaminophen  Allergies:  Allergies  Allergen Reactions  . Codeine Itching    Family History  Problem Relation Age of Onset  . Coronary artery disease      Multiple first and second-degree relatives  . Aneurysm      Cerebral circulation  . Hypertension Mother   . Colon cancer Sister   . Clotting disorder      Children diagnosed with hypercoagulable state    Social History:  reports that she has never smoked. She has never  used smokeless tobacco. She reports that she does not drink alcohol or use illicit drugs.  ROS: A complete review of systems was performed.  All systems are negative except for pertinent findings as noted. Frequency, urgency, urgency incontinence, dysuria. No gross hematuria. No flank or abdominal pain.  Physical Exam:  Vital signs in last 24 hours: Temp:  [98.4 F (36.9 C)] 98.4 F (36.9 C) (09/08 1424) Pulse Rate:  [79-80] 79 (09/08 1424) Resp:  [18] 18 (09/08 1424) BP: (142-161)/(73-84) 161/82 mmHg (09/08 1424) SpO2:  [85 %-96 %] 96 % (09/08 1453) General:  Alert and oriented, No acute distress HEENT: Normocephalic, atraumatic Neck: No JVD or lymphadenopathy Lungs:  Normal inspiratory and expiratory excursion Extremities: No edema Neurologic: Grossly intact  Laboratory Data:  No results found for this basename: WBC, HGB, HCT, PLT,  in the last 72 hours  No results found for this basename: NA, K, CL, CO3, GLUCOSE, BUN, CALCIUM, CREATININE,  in the last 72 hours   Results for orders placed during the hospital encounter of 08/24/14 (from the past 24 hour(s))  GLUCOSE, CAPILLARY     Status: Abnormal   Collection Time    08/29/14 10:22 PM      Result Value Ref Range   Glucose-Capillary 233 (*) 70 - 99 mg/dL   Comment 1 Documented in Chart     Comment 2 Notify RN    GLUCOSE, CAPILLARY     Status: Abnormal   Collection Time    08/30/14  7:04 AM      Result Value Ref Range   Glucose-Capillary 159 (*) 70 - 99 mg/dL   Comment 1 Documented in Chart     Comment 2 Notify RN    GLUCOSE, CAPILLARY     Status: Abnormal   Collection Time    08/30/14 11:33 AM      Result Value Ref Range   Glucose-Capillary 268 (*) 70 - 99 mg/dL   Comment 1 Notify RN     Comment 2 Documented in Chart     Recent Results (from the past 240 hour(s))  URINE CULTURE     Status: None   Collection Time    08/24/14  5:30 PM      Result Value Ref Range Status   Specimen Description URINE, CATHETERIZED    Final   Special Requests NONE   Final   Culture  Setup Time     Final   Value: 08/24/2014 23:07     Performed at [facility]   Colony Count     Final   Value: NO GROWTH     Performed at [facility]   Culture     Final   Value: NO GROWTH     Performed at [facility]   Report Status 08/25/2014 FINAL   Final    Renal Function:  Recent Labs  08/24/14 1541  CREATININE 0.77   Estimated Creatinine Clearance: 68 ml/min (by C-G formula based on Cr of 0.77).  Radiologic Imaging: No results found.  Laboratory studies were reviewed  Impression/Assessment:  1. Lower urinary tract symptomatology. In our office, she had urinary retention/incomplete emptying on urodynamics. She had a recent residual urine volume here of 600 mL. She is diabetic, and some of his bladder dysfunction may be due to diabetic cystoscopy  2. Pyuria-she's had a negative culture on 08/24/2014. She is currently on Rocephin.  Plan:  1. I would suggest at the present time, we place a catheter, and leave that in to let her bladder settled down  2. If her most recent culture is negative, I would not continue antibiotics. Her urinalysis appearance may be due to recent biopsy and healing of the bladder wall  3. I would suggest that the patient followup with Dr. Annabell Howells in our office here in about 10 days. If she continues to have problems with incomplete emptying, she may be a candidate for in and out catheterization.

## 2014-08-31 LAB — GLUCOSE, CAPILLARY
GLUCOSE-CAPILLARY: 149 mg/dL — AB (ref 70–99)
Glucose-Capillary: 186 mg/dL — ABNORMAL HIGH (ref 70–99)

## 2014-08-31 LAB — URINE CULTURE
Colony Count: NO GROWTH
Culture: NO GROWTH
Special Requests: NORMAL

## 2014-08-31 NOTE — Progress Notes (Signed)
My consult note has bogged down in the system. I think the cough is from lisinopril. I switched her to losartan. I told her that it may take a month for the cough to resolve if it is from lisinopril. She understands. I discussed this with Dr. Renard Matter

## 2014-08-31 NOTE — Progress Notes (Signed)
Patient being discharged home with husband at bedside. Homecare to evaluate patient tomorrow.  Foley leg bag in place and patient educated on usage and emptying bag. Patient educated on safe handling and verbalizes " barely noticing its there".  Vitals stable on room air and all belongings sent with patient in wheelchair.  Patient verbalizes no pain and discharge education given and all prescriptions given to patient.

## 2014-08-31 NOTE — Consult Note (Signed)
NAME:  [name] [name], [name] [name]              ACCOUNT NO.:  [id number]  MEDICAL RECORD NO.:  [id number]  LOCATION:                                 FACILITY:  PHYSICIAN:  Eveny Anastas L. Juanetta Gosling, M.D.DATE OF BIRTH:  January 17, 1936  DATE OF CONSULTATION:  08/30/2014 DATE OF DISCHARGE:                                CONSULTATION   Patient of Dr. Renard Matter.  [name] [name] was admitted with near syncope.  Since she has been in the hospital, she has been complaining of a cough which is mostly nonproductive.  She has a history of coronary artery occlusive disease, COPD, sleep apnea, and has had severe inflammatory bladder symptoms. Her major complaint is that she is having trouble with her bladder now. Her oxygen saturation dropped down into the 80s.  Urinalysis suggested that she had a UTI and she had a blood gas done that showed mild hypoxia.  REVIEW OF SYSTEMS:  She has not had any fever, chills, diaphoresis, photophobia, eye pain, hearing loss, nausea, vomiting, diarrhea, chest pain, palpitations or leg swelling, myalgias, joint swelling, arthralgias, but she has had the near syncope.  PAST MEDICAL HISTORY:  Positive for coronary artery bypass surgery in 2001.  She had a preserved left ventricular systolic function.  She has hypertension, varicose veins, hypothyroidism, insulin dependent type 2 diabetes, peripheral neuropathy, diastolic CHF, previous CVA, degenerative disk disease, obstructive sleep apnea, COPD and the bladder problems as mentioned.  She has had rotator cuff surgery, lumbar spine surgery, carpal tunnel release, humerus fracture, arthroscopy, cholecystectomy, appendectomy, total abdominal hysterectomy with bilateral salpingo-oophorectomy, cataract surgery, cardiac catheterization angioplasties and coronary artery bypass grafting.  She has had recent cystoscopy.  SOCIAL HISTORY:  She is a nonsmoker lifelong.  She does not use any alcohol or illicit drugs.  ALLERGIES:  She is allergic  to CODEINE.  FAMILY HISTORY:  Positive for coronary artery disease in multiple family members, hypertension and colon cancer.  MEDICATIONS:  As listed in the medical record.  PHYSICAL EXAMINATION:  GENERAL:  Shows a well-developed, somewhat obese female, who is in no acute distress. HEENT:  Her pupils are reactive.  Nose and throat are clear.  Mucous membranes are moist. NECK:  Supple without masses. EXTREMITIES:  No edema. CHEST:  Relatively clear. HEART:  Regular without gallop. ABDOMEN:  Soft.  No masses are felt. CENTRAL NERVOUS SYSTEM:  Grossly intact.  ASSESSMENT:  She has dry cough which may be related to Prinivil, so I am going to discontinue that.  Continue with her other treatments.  She wants to see if there is something else that can be done about her bladder which is her major complaint.  Otherwise, I will plan to follow with you.  She will be switched to losartan from Prinivil.     Latanya Hemmer L. Juanetta Gosling, M.D.     ELH/MEDQ  D:  08/30/2014  T:  08/30/2014  Job:  [id number]

## 2014-08-31 NOTE — Discharge Summary (Signed)
Physician Discharge Summary  Anella Nakata Fellman ZOX:[id number] DOB: March 16, 1936 DOA: 08/24/2014  PCP: Alice Reichert, MD  Admit date: 08/24/2014 Discharge date: 08/31/2014   Follow-up Information   Follow up with [facility].   Contact information:   [address] [place] [address] [phone]       Discharge Diagnoses:  1. Near syncope vasovagal hypoxia 2. Diabetes mellitus type 2 3. Hypothyroidism 4. Hypertension essential 5. Interstitial cystitis recent biopsy of bladder 6. Diastolic congestive heart failure 7. Coronary artery disease status post CABG  Discharge Condition: Stable Disposition: Home  Diet recommendation: 2000-calorie ADA diet low-sodium  Filed Weights   08/24/14 1502 08/31/14 0550  Weight: 95.255 kg (210 lb) 93.668 kg (206 lb 8 oz)    History of present illness:  The patient was admitted hospital following episode of near-syncope. She was seen subsequently in emergency department and evaluated and was noted be slightly hypoxic. She was placed on nasal O2 is hours ago he was admitted to [facility] floor  Hospital Course:  Urine was cultured at the time of admission. She was started on intravenous antibiotics IV Rocephin and remained on this antibiotic her entire hospitalization until last few days and was changed to Levaquin 500 mg daily. Patient is blood sugars were monitored throughout the hospitalization and remained in the 150-200 range. She remained on diabetic diet and basal insulin. The patient complained of burning with urination throughout the entire hospitalization. She was treated with Pyridium as well as antibiotic coverage. Towards the latter part hospitalization she went into urinary retention and had to be catheterized. She subsequently was seen by urology who recommended continued catheterization. It was also recommended she be seen by urologist as outpatient after discharge. Because of development of cough she was seen in  consultation by pulmonology who recommended discontinuation of lisinopril and starting losartan. The patient remained on medications listed below and gradually improved before we felt she could be discharged home   Discharge Instructions The patient is to continue the medications listed below. Appointment will be made with urology on Friday, September 11    Medication List    STOP taking these medications       traMADol 50 MG tablet  Commonly known as:  ULTRAM      TAKE these medications       aspirin EC 325 MG tablet  Take 325 mg by mouth daily.     desipramine 10 MG tablet  Commonly known as:  NOPRAMIN  Take 10 mg by mouth 2 (two) times daily.     insulin lispro 100 UNIT/ML injection  Commonly known as:  HUMALOG  Inject 20-25 Units into the skin 3 (three) times daily.     LEVEMIR 100 UNIT/ML injection  Generic drug:  insulin detemir  Inject 25-70 Units into the skin 2 (two) times daily. 30 units in the morning and 70 units at bedtime     levothyroxine 25 MCG tablet  Commonly known as:  SYNTHROID, LEVOTHROID  Take 25 mcg by mouth every morning.     lisinopril 10 MG tablet  Commonly known as:  PRINIVIL,ZESTRIL  Take 1 tablet by mouth every morning.     LORazepam 0.5 MG tablet  Commonly known as:  ATIVAN  Take 0.5 mg by mouth every 4 (four) hours as needed. nerves     metFORMIN 500 MG tablet  Commonly known as:  GLUCOPHAGE  Take 500 mg by mouth 2 (two) times daily with a meal.  nitroGLYCERIN 0.4 MG SL tablet  Commonly known as:  NITROSTAT  Place 0.4 mg under the tongue every 5 (five) minutes as needed for chest pain.     phenazopyridine 200 MG tablet  Commonly known as:  PYRIDIUM  Take 1 tablet (200 mg total) by mouth 3 (three) times daily as needed for pain.     rosuvastatin 10 MG tablet  Commonly known as:  CRESTOR  Take 10 mg by mouth at bedtime.     zolpidem 5 MG tablet  Commonly known as:  AMBIEN  Take 5 mg by mouth at bedtime.       Allergies   Allergen Reactions  . Codeine Itching    The results of significant diagnostics from this hospitalization (including imaging, microbiology, ancillary and laboratory) are listed below for reference.    Significant Diagnostic Studies: Dg Chest 2 View  08/28/2014   CLINICAL DATA:  History of diabetes, CAD, now with wheezing following bladder procedure  EXAM: CHEST  2 VIEW  COMPARISON:  08/24/2014; 12/12/2013  FINDINGS: Grossly unchanged borderline enlarged cardiac silhouette and mediastinal contours post median sternotomy and CABG. Mild cephalization of flow without frank evidence of edema. No discrete focal airspace opacities. No pleural effusion or pneumothorax. No evidence of edema. There is mild elevation / eventration of the right hemidiaphragm. Unchanged bones including sequela of right-sided rotator cuff repair and sideplate fixation of the proximal right humeral metadiaphysis, incompletely imaged. Post cholecystectomy.  IMPRESSION: Mild pulmonary venous congestion without frank evidence of edema.   Electronically Signed   By: Simonne Come M.D.   On: 08/28/2014 08:18   Dg Chest 2 View  08/24/2014   CLINICAL DATA:  Near syncope.  Dizziness  EXAM: CHEST  2 VIEW  COMPARISON:  12/12/2013  FINDINGS: Mild cardiomegaly which is stable from prior. The patient has undergone CABG. Negative aortic contours.  There is no edema, consolidation, effusion, or pneumothorax. Mild scarring or atelectasis in the left upper lung.  Proximal right humerus ORIF and soft tissue anchoring.  IMPRESSION: No active cardiopulmonary disease.   Electronically Signed   By: Tiburcio Pea M.D.   On: 08/24/2014 16:14    Microbiology: Recent Results (from the past 240 hour(s))  URINE CULTURE     Status: None   Collection Time    08/24/14  5:30 PM      Result Value Ref Range Status   Specimen Description URINE, CATHETERIZED   Final   Special Requests NONE   Final   Culture  Setup Time     Final   Value: 08/24/2014 23:07      Performed at [facility]   Colony Count     Final   Value: NO GROWTH     Performed at [facility]   Culture     Final   Value: NO GROWTH     Performed at [facility]   Report Status 08/25/2014 FINAL   Final  URINE CULTURE     Status: None   Collection Time    08/29/14  8:30 PM      Result Value Ref Range Status   Specimen Description URINE, CLEAN CATCH   Final   Special Requests rocephin Normal   Final   Culture  Setup Time     Final   Value: 08/30/2014 14:07     Performed at [facility] Count     Final   Value: NO GROWTH     Performed at  [facility]   Culture     Final   Value: NO GROWTH     Performed at [facility]   Report Status 08/31/2014 FINAL   Final     Labs: Basic Metabolic Panel:  Recent Labs Lab 08/24/14 1541  NA 140  K 4.9  CL 101  CO2 26  GLUCOSE 128*  BUN 13  CREATININE 0.77  CALCIUM 9.7   Liver Function Tests:  Recent Labs Lab 08/24/14 1541  AST 23  ALT 14  ALKPHOS 82  BILITOT 0.4  PROT 7.8  ALBUMIN 3.9   No results found for this basename: LIPASE, AMYLASE,  in the last 168 hours No results found for this basename: AMMONIA,  in the last 168 hours CBC:  Recent Labs Lab 08/24/14 1541  WBC 12.6*  NEUTROABS 7.2  HGB 12.1  HCT 39.8  MCV 79.3  PLT 321   Cardiac Enzymes:  Recent Labs Lab 08/24/14 1541  TROPONINI <0.30   BNP: BNP (last 3 results)  Recent Labs  12/12/13 0604 08/24/14 1541 08/29/14 0732  PROBNP 1377.0* 101.4 399.8   CBG:  Recent Labs Lab 08/30/14 0704 08/30/14 1133 08/30/14 1602 08/30/14 2104 08/31/14 0718  GLUCAP 159* 268* 208* 233* 149*    Active Problems:   Hypothyroidism   DIABETES MELLITUS, TYPE II   HYPERTENSION   Arteriosclerotic cardiovascular disease (ASCVD)   Cerebrovascular disease   Chronic obstructive pulmonary disease   UTI (lower urinary tract infection)   Hypoxia   Weakness generalized   Near syncope   UTI  (urinary tract infection)   Time coordinating discharge: 45 minutes Signed:  Butch Penny, MD 08/31/2014, 10:57 AM

## 2014-09-09 ENCOUNTER — Ambulatory Visit (INDEPENDENT_AMBULATORY_CARE_PROVIDER_SITE_OTHER): Payer: Medicare Other | Admitting: Urology

## 2014-09-09 DIAGNOSIS — R339 Retention of urine, unspecified: Secondary | ICD-10-CM

## 2014-09-09 DIAGNOSIS — N3010 Interstitial cystitis (chronic) without hematuria: Secondary | ICD-10-CM

## 2014-09-11 ENCOUNTER — Encounter (HOSPITAL_COMMUNITY): Payer: Self-pay | Admitting: Emergency Medicine

## 2014-09-11 ENCOUNTER — Emergency Department (HOSPITAL_COMMUNITY): Payer: Medicare Other

## 2014-09-11 ENCOUNTER — Emergency Department (HOSPITAL_COMMUNITY)
Admission: EM | Admit: 2014-09-11 | Discharge: 2014-09-11 | Disposition: A | Discharge status: Home / Self Care | Payer: Medicare Other | Attending: Emergency Medicine | Admitting: Emergency Medicine

## 2014-09-11 DIAGNOSIS — Z79899 Other long term (current) drug therapy: Secondary | ICD-10-CM | POA: Insufficient documentation

## 2014-09-11 DIAGNOSIS — J40 Bronchitis, not specified as acute or chronic: Secondary | ICD-10-CM

## 2014-09-11 DIAGNOSIS — R079 Chest pain, unspecified: Secondary | ICD-10-CM | POA: Diagnosis present

## 2014-09-11 DIAGNOSIS — Z8739 Personal history of other diseases of the musculoskeletal system and connective tissue: Secondary | ICD-10-CM | POA: Insufficient documentation

## 2014-09-11 DIAGNOSIS — I10 Essential (primary) hypertension: Secondary | ICD-10-CM | POA: Diagnosis not present

## 2014-09-11 DIAGNOSIS — J4489 Other specified chronic obstructive pulmonary disease: Secondary | ICD-10-CM | POA: Insufficient documentation

## 2014-09-11 DIAGNOSIS — J449 Chronic obstructive pulmonary disease, unspecified: Secondary | ICD-10-CM | POA: Insufficient documentation

## 2014-09-11 DIAGNOSIS — Z872 Personal history of diseases of the skin and subcutaneous tissue: Secondary | ICD-10-CM | POA: Diagnosis not present

## 2014-09-11 DIAGNOSIS — I5030 Unspecified diastolic (congestive) heart failure: Secondary | ICD-10-CM | POA: Insufficient documentation

## 2014-09-11 DIAGNOSIS — Z7982 Long term (current) use of aspirin: Secondary | ICD-10-CM | POA: Diagnosis not present

## 2014-09-11 DIAGNOSIS — E119 Type 2 diabetes mellitus without complications: Secondary | ICD-10-CM | POA: Insufficient documentation

## 2014-09-11 DIAGNOSIS — Z8673 Personal history of transient ischemic attack (TIA), and cerebral infarction without residual deficits: Secondary | ICD-10-CM | POA: Diagnosis not present

## 2014-09-11 DIAGNOSIS — E039 Hypothyroidism, unspecified: Secondary | ICD-10-CM | POA: Insufficient documentation

## 2014-09-11 DIAGNOSIS — E785 Hyperlipidemia, unspecified: Secondary | ICD-10-CM | POA: Diagnosis not present

## 2014-09-11 DIAGNOSIS — Z794 Long term (current) use of insulin: Secondary | ICD-10-CM | POA: Diagnosis not present

## 2014-09-11 DIAGNOSIS — Z951 Presence of aortocoronary bypass graft: Secondary | ICD-10-CM | POA: Diagnosis not present

## 2014-09-11 DIAGNOSIS — I252 Old myocardial infarction: Secondary | ICD-10-CM | POA: Diagnosis not present

## 2014-09-11 LAB — CBC WITH DIFFERENTIAL/PLATELET
BASOS PCT: 1 % (ref 0–1)
Basophils Absolute: 0.1 10*3/uL (ref 0.0–0.1)
Eosinophils Absolute: 0.9 10*3/uL — ABNORMAL HIGH (ref 0.0–0.7)
Eosinophils Relative: 9 % — ABNORMAL HIGH (ref 0–5)
HEMATOCRIT: 38.6 % (ref 36.0–46.0)
Hemoglobin: 11.7 g/dL — ABNORMAL LOW (ref 12.0–15.0)
LYMPHS PCT: 27 % (ref 12–46)
Lymphs Abs: 2.8 10*3/uL (ref 0.7–4.0)
MCH: 23.9 pg — ABNORMAL LOW (ref 26.0–34.0)
MCHC: 30.3 g/dL (ref 30.0–36.0)
MCV: 78.9 fL (ref 78.0–100.0)
MONO ABS: 0.8 10*3/uL (ref 0.1–1.0)
MONOS PCT: 7 % (ref 3–12)
Neutro Abs: 5.8 10*3/uL (ref 1.7–7.7)
Neutrophils Relative %: 56 % (ref 43–77)
Platelets: 333 10*3/uL (ref 150–400)
RBC: 4.89 MIL/uL (ref 3.87–5.11)
RDW: 16.8 % — AB (ref 11.5–15.5)
WBC: 10.4 10*3/uL (ref 4.0–10.5)

## 2014-09-11 LAB — COMPREHENSIVE METABOLIC PANEL
ALK PHOS: 80 U/L (ref 39–117)
ALT: 15 U/L (ref 0–35)
AST: 17 U/L (ref 0–37)
Albumin: 3.6 g/dL (ref 3.5–5.2)
Anion gap: 10 (ref 5–15)
BILIRUBIN TOTAL: 0.2 mg/dL — AB (ref 0.3–1.2)
BUN: 9 mg/dL (ref 6–23)
CHLORIDE: 103 meq/L (ref 96–112)
CO2: 27 mEq/L (ref 19–32)
Calcium: 9.4 mg/dL (ref 8.4–10.5)
Creatinine, Ser: 0.74 mg/dL (ref 0.50–1.10)
GFR calc non Af Amer: 81 mL/min — ABNORMAL LOW (ref >90)
Glucose, Bld: 219 mg/dL — ABNORMAL HIGH (ref 70–99)
POTASSIUM: 4.4 meq/L (ref 3.7–5.3)
Sodium: 140 mEq/L (ref 137–147)
Total Protein: 7.6 g/dL (ref 6.0–8.3)

## 2014-09-11 LAB — TROPONIN I: Troponin I: <0.3 ng/mL (ref <0.30)

## 2014-09-11 MED ORDER — AZITHROMYCIN 250 MG PO TABS
500.0000 mg | ORAL_TABLET | Freq: Once | ORAL | Status: AC
  Administered 2014-09-11: 500 mg via ORAL
  Filled 2014-09-11: qty 2

## 2014-09-11 MED ORDER — AZITHROMYCIN 250 MG PO TABS: 250.0000 mg | ORAL_TABLET | Freq: Every day | ORAL | Status: DC

## 2014-09-11 MED ORDER — ALBUTEROL SULFATE (2.5 MG/3ML) 0.083% IN NEBU
5.0000 mg | INHALATION_SOLUTION | Freq: Once | RESPIRATORY_TRACT | Status: AC
  Administered 2014-09-11: 5 mg via RESPIRATORY_TRACT
  Filled 2014-09-11: qty 6

## 2014-09-11 MED ORDER — BENZONATATE 200 MG PO CAPS: 200.0000 mg | ORAL_CAPSULE | Freq: Three times a day (TID) | ORAL | Status: DC | PRN

## 2014-09-11 MED ORDER — ALBUTEROL SULFATE (2.5 MG/3ML) 0.083% IN NEBU: 2.5000 mg | INHALATION_SOLUTION | Freq: Four times a day (QID) | RESPIRATORY_TRACT | Status: DC | PRN

## 2014-09-11 MED ORDER — ALBUTEROL SULFATE (2.5 MG/3ML) 0.083% IN NEBU
2.5000 mg | INHALATION_SOLUTION | Freq: Once | RESPIRATORY_TRACT | Status: AC
  Administered 2014-09-11: 2.5 mg via RESPIRATORY_TRACT
  Filled 2014-09-11: qty 3

## 2014-09-11 NOTE — ED Notes (Signed)
Chest pain started suddenly around lunch time today.  Associated w/SOB, dizziness, diaphoresis.  Has productive cough and took neb tx before leaving home.  Didn't sleep well last night d/t difficulty breathing.

## 2014-09-11 NOTE — ED Provider Notes (Signed)
CSN: [id number]     Arrival date & time 09/11/14  1558 History   First MD Initiated Contact with Patient 09/11/14 1648     Chief Complaint  Patient presents with  . Chest Pain     (Consider location/radiation/quality/duration/timing/severity/associated sxs/prior Treatment) HPI Comments: The patient is a 78 year old female, history of hypertension, diabetes as well as coronary disease status post bypass grafting many years ago. She has had a cough for approximately 4 days, increased shortness of breath, productive of a "foul-appearing phlegm". Over the last 5 hours she has developed chest pain in the middle of her chest when she coughs and takes a deep breath. She denies fevers but does have some chills, no nausea vomiting abdominal pain back pain or swelling of the lower extremities. Nothing seems to make this better or worse, no associated fevers or vomiting. She states this does not feel similar to prior heart problems  Patient is a 78 y.o. female presenting with chest pain. The history is provided by the patient and a relative.  Chest Pain   Past Medical History  Diagnosis Date  . Arteriosclerotic cardiovascular disease (ASCVD) CARDIOLOGIST-   DR Dietrich Pates     CABG surgery in 2001; nl stress nuclear-2007; angiography in 01/2006- TO of LAD; 70% ostial stenosis      of a small ramus; patent grafts; normal EF; cath in 12/2009 essentially unchanged; EF-66%;  Congestive heart failure with preserved LV systolic function  . Hypertension   . Varicose veins   . Hypothyroidism   . Insulin dependent type 2 diabetes mellitus   . Peripheral neuropathy   . Diastolic CHF   . Hyperlipidemia   . History of CVA (cerebrovascular accident)     2013--  LACUNAR INFARCTIONS RIGHT THALAMUS---  residual left side of  lip numb  and left eye vision worse (which corrented lens implant from cataract extraction)  . Lesion of bladder   . Hematuria   . Arthritis   . DDD (degenerative disc disease), lumbar   . OSA  (obstructive sleep apnea)     NON-COMPLIANT CPAP  . Chronic obstructive pulmonary disease     Chronic bronchitis  . History of MI (myocardial infarction)     07/ 2001   s/p  cabg x2  . S/P CABG x 2     07-02-2000  . SUI (stress urinary incontinence, female)   . Lower urinary tract symptoms (LUTS)   . Psoriasis     lower leg  . Thinning of skin    Past Surgical History  Procedure Laterality Date  . Rotator cuff repair Right 1995  . Lumbar spine surgery  x2    yrs ago  . Carpal tunnel release Bilateral yrs ago  . Orif humerus fracture Right 04-18-2008  . Knee arthroscopy Left 10-18-2003    SYNOVECTOMY  . Repair flexor tendon hand Right 09-20-2008    CARPI RADIALIS TENDON TRANSFER TO EXTENSOR TO INDEX, MIDDLE, RING , LITTLE FINGERS AND DIGITI MININI  . Cardiovascular stress test  08-28-2011   DR ROTHBART    NEGATIVE NUCLEAR STUDY/  GLOBAL LVSF/  EF 65%  . Cholecystectomy  1990  . Appendectomy  age 38  . Total abdominal hysterectomy w/ bilateral salpingoophorectomy  age 73  . Cataract extraction w/ intraocular lens  implant, bilateral  2013  . Cardiac catheterization  1991    No sig. cad  . Coronary angioplasty  11-13-1999    balloon angioplasty to mLAD , d2 of LAD  . Coronary angioplasty  with stent placement  01-17-2000    PCI stenting to mLAD & D2 of LAD  . Cardiac catheterization  07-01-2000    high grade in-stent restenosis  . Cardiac catheterization  04-30-2001    single native vessel cad/ graft patent  . Cardiac catheterization  02-13-2006  dr Juanda Chance    2V cad with total occluded LAD  and  70% ostial of small ramus/  grafts patent/  normal ef  . Cardiac catheterization  01-20-2000  dr Clifton James    essentially unchanged;  severe single vessel cad with diffuse disease throughout CFX and RCA/ ef 66%;  chf with preserved LVSF  . Coronary artery bypass graft  07-02-2000   DR Tressie Stalker    SVG to diagonal, LIMA to LAD  . Transthoracic echocardiogram  12-12-2013    MILD  LVH/  EF 60-65%/  GRADE I DIASTOLIC DYSFUNCTION/  MILD LAE  . Cystoscopy w/ retrogrades Bilateral 08/09/2014    Procedure: CYSTOSCOPY, BILATERAL RETROGRADE, HYDRODISTENSION, BLADDER BIOPSY WITH FULGERATION, INSTILL PYRIDIUM AND MARCAINE ;  Surgeon: Anner Crete, MD;  Location: [facility];  Service: Urology;  Laterality: Bilateral;   Family History  Problem Relation Age of Onset  . Coronary artery disease      Multiple first and second-degree relatives  . Aneurysm      Cerebral circulation  . Hypertension Mother   . Colon cancer Sister   . Clotting disorder      Children diagnosed with hypercoagulable state   History  Substance Use Topics  . Smoking status: Never Smoker   . Smokeless tobacco: Never Used  . Alcohol Use: No   OB History   Grav Para Term Preterm Abortions TAB SAB Ect Mult Living                 Review of Systems  Cardiovascular: Positive for chest pain.  All other systems reviewed and are negative.     Allergies  Codeine  Home Medications   Prior to Admission medications   Medication Sig Start Date End Date Taking? Authorizing Provider  albuterol (PROVENTIL) (2.5 MG/3ML) 0.083% nebulizer solution Take 3 mLs (2.5 mg total) by nebulization every 6 (six) hours as needed for wheezing or shortness of breath. 09/11/14   Vida Roller, MD  aspirin EC 325 MG tablet Take 325 mg by mouth daily.    Historical Provider, MD  azithromycin (ZITHROMAX Z-PAK) 250 MG tablet Take 1 tablet (250 mg total) by mouth daily.  PO day 1, then  PO days 205 09/11/14   Vida Roller, MD  benzonatate (TESSALON) 200 MG capsule Take 1 capsule (200 mg total) by mouth 3 (three) times daily as needed for cough. 09/11/14   Vida Roller, MD  desipramine (NOPRAMIN) 10 MG tablet Take 10 mg by mouth 2 (two) times daily. 08/22/14   Historical Provider, MD  insulin detemir (LEVEMIR) 100 UNIT/ML injection Inject 25-70 Units into the skin 2 (two) times daily. 30 units in the  morning and 70 units at bedtime    Historical Provider, MD  insulin lispro (HUMALOG) 100 UNIT/ML injection Inject 20-25 Units into the skin 3 (three) times daily.     Historical Provider, MD  levothyroxine (SYNTHROID, LEVOTHROID) 25 MCG tablet Take 25 mcg by mouth every morning.     Historical Provider, MD  lisinopril (PRINIVIL,ZESTRIL) 10 MG tablet Take 1 tablet by mouth every morning.  11/09/13   Historical Provider, MD  LORazepam (ATIVAN) 0.5 MG tablet Take 0.5 mg  by mouth every 4 (four) hours as needed. nerves    Historical Provider, MD  losartan (COZAAR) 50 MG tablet Take 50 mg by mouth daily. 08/31/14   Historical Provider, MD  metFORMIN (GLUCOPHAGE) 500 MG tablet Take 500 mg by mouth 2 (two) times daily with a meal.      Historical Provider, MD  nitroGLYCERIN (NITROSTAT) 0.4 MG SL tablet Place 0.4 mg under the tongue every 5 (five) minutes as needed for chest pain.     Historical Provider, MD  oxyCODONE-acetaminophen (PERCOCET) 10-325 MG per tablet Take 1 tablet by mouth every 4 (four) hours as needed. For pain 09/08/14   Historical Provider, MD  phenazopyridine (PYRIDIUM) 200 MG tablet Take 1 tablet (200 mg total) by mouth 3 (three) times daily as needed for pain. 08/09/14   Anner Crete, MD  rosuvastatin (CRESTOR) 10 MG tablet Take 10 mg by mouth at bedtime.     Historical Provider, MD  traMADol (ULTRAM) 50 MG tablet Take 50 mg by mouth every 6 (six) hours as needed. For pain 08/22/14   Historical Provider, MD  zolpidem (AMBIEN) 5 MG tablet Take 5 mg by mouth at bedtime. 08/12/14   Historical Provider, MD   BP 170/76  Pulse 94  Temp(Src) 97.7 F (36.5 C) (Oral)  Resp 23  Ht  (1.626 m)  Wt 205 lb (92.987 kg)  BMI 35.17 kg/m2  SpO2 95% Physical Exam  Nursing note and vitals reviewed. Constitutional: She appears well-developed and well-nourished. No distress.  HENT:  Head: Normocephalic and atraumatic.  Mouth/Throat: Oropharynx is clear and moist. No oropharyngeal exudate.  Eyes:  Conjunctivae and EOM are normal. Pupils are equal, round, and reactive to light. Right eye exhibits no discharge. Left eye exhibits no discharge. No scleral icterus.  Neck: Normal range of motion. Neck supple. No JVD present. No thyromegaly present.  Cardiovascular: Normal rate, regular rhythm, normal heart sounds and intact distal pulses.  Exam reveals no gallop and no friction rub.   No murmur heard. Pulmonary/Chest: Effort normal. No respiratory distress. She has wheezes. She has no rales.  Speaks in full sentences, prolonged expiratory phase, wheezing in all lung fields, no accessory muscle use  Abdominal: Soft. Bowel sounds are normal. She exhibits no distension and no mass. There is no tenderness.  Musculoskeletal: Normal range of motion. She exhibits no edema and no tenderness.  Lymphadenopathy:    She has no cervical adenopathy.  Neurological: She is alert. Coordination normal.  Skin: Skin is warm and dry. No rash noted. No erythema.  Psychiatric: She has a normal mood and affect. Her behavior is normal.    ED Course  Procedures (including critical care time) Labs Review Labs Reviewed  CBC WITH DIFFERENTIAL - Abnormal; Notable for the following:    Hemoglobin 11.7 (*)    MCH 23.9 (*)    RDW 16.8 (*)    Eosinophils Relative 9 (*)    Eosinophils Absolute 0.9 (*)    All other components within normal limits  COMPREHENSIVE METABOLIC PANEL - Abnormal; Notable for the following:    Glucose, Bld 219 (*)    Total Bilirubin 0.2 (*)    GFR calc non Af Amer 81 (*)    All other components within normal limits  TROPONIN I    Imaging Review Dg Chest 2 View  09/11/2014   CLINICAL DATA:  Cough, congestion  EXAM: CHEST  2 VIEW  COMPARISON:  Chest radiograph 08/28/2014  FINDINGS: Stable cardiac and mediastinal contours status post  median sternotomy. No consolidative pulmonary opacities. No pleural effusion or pneumothorax. Postsurgical change proximal right humerus. Mid thoracic spine  degenerative change.  IMPRESSION: No acute cardiopulmonary process.   Electronically Signed   By: Annia Belt M.D.   On: 09/11/2014 17:05     EKG Interpretation   Date/Time:  Sunday September 11 2014 16:04:38 EDT Ventricular Rate:  95 PR Interval:  219 QRS Duration: 110 QT Interval:  376 QTC Calculation: 473 R Axis:   -84 Text Interpretation:  Sinus rhythm Prolonged PR interval Left anterior  fascicular block Anterior infarct, old since last tracing no significant  change Confirmed by Harrison Paulson  MD, Korey Arroyo ([id number]) on 09/11/2014 5:02:52 PM      MDM   Final diagnoses:  Bronchitis    The patient has normal heart sounds, the EKG is unchanged, her blood work shows no leukocytosis, chest x-ray pending. This is likely a primary pulmonary process, x-ray to rule out pneumonia, albuterol treatment for the wheezing, possibly just bronchitis. Doubt congestive heart failure.  Testing without acute findings, lab work unremarkable, chest x-ray with no signs of pneumonia, wheezing improved after nebulized treatment, second nebulizer given, patient states she has nebulizer at home. Medications given as below, prescriptions as below, patient expresses understanding and is amenable to discharge.   Meds given in ED:  Medications  albuterol (PROVENTIL) (2.5 MG/3ML) 0.083% nebulizer solution 2.5 mg (2.5 mg Nebulization Given 09/11/14 1702)  albuterol (PROVENTIL) (2.5 MG/3ML) 0.083% nebulizer solution 5 mg (5 mg Nebulization Given 09/11/14 1800)  azithromycin (ZITHROMAX) tablet 500 mg (500 mg Oral Given 09/11/14 1816)    New Prescriptions   ALBUTEROL (PROVENTIL) (2.5 MG/3ML) 0.083% NEBULIZER SOLUTION    Take 3 mLs (2.5 mg total) by nebulization every 6 (six) hours as needed for wheezing or shortness of breath.   AZITHROMYCIN (ZITHROMAX Z-PAK) 250 MG TABLET    Take 1 tablet (250 mg total) by mouth daily.  PO day 1, then  PO days 205   BENZONATATE (TESSALON) 200 MG CAPSULE    Take 1 capsule (200 mg  total) by mouth 3 (three) times daily as needed for cough.      Vida Roller, MD 09/11/14 [phone]

## 2014-09-11 NOTE — Discharge Instructions (Signed)
Your xray is normal  Your blood work is normal  Use the albuterol inhaler every 4 hours for 24 hours, then every 4 hours as needed  Zithromax once daily for 5 days  Tessalon as prescribed.  Please call your doctor for a followup appointment within 24-48 hours. When you talk to your doctor please let them know that you were seen in the emergency department and have them acquire all of your records so that they can discuss the findings with you and formulate a treatment plan to fully care for your new and ongoing problems.

## 2014-10-07 ENCOUNTER — Ambulatory Visit (INDEPENDENT_AMBULATORY_CARE_PROVIDER_SITE_OTHER): Payer: Medicare Other | Admitting: Urology

## 2014-10-07 DIAGNOSIS — N3020 Other chronic cystitis without hematuria: Secondary | ICD-10-CM

## 2014-10-11 IMAGING — CR DG KNEE COMPLETE 4+V*R*
4 series · 4 of 4 positions shown · non-contrast
Comparison: None

CLINICAL DATA: Pain for 2 weeks

RIGHT KNEE - COMPLETE 4+ VIEW

[view not recorded (1 of 4)]
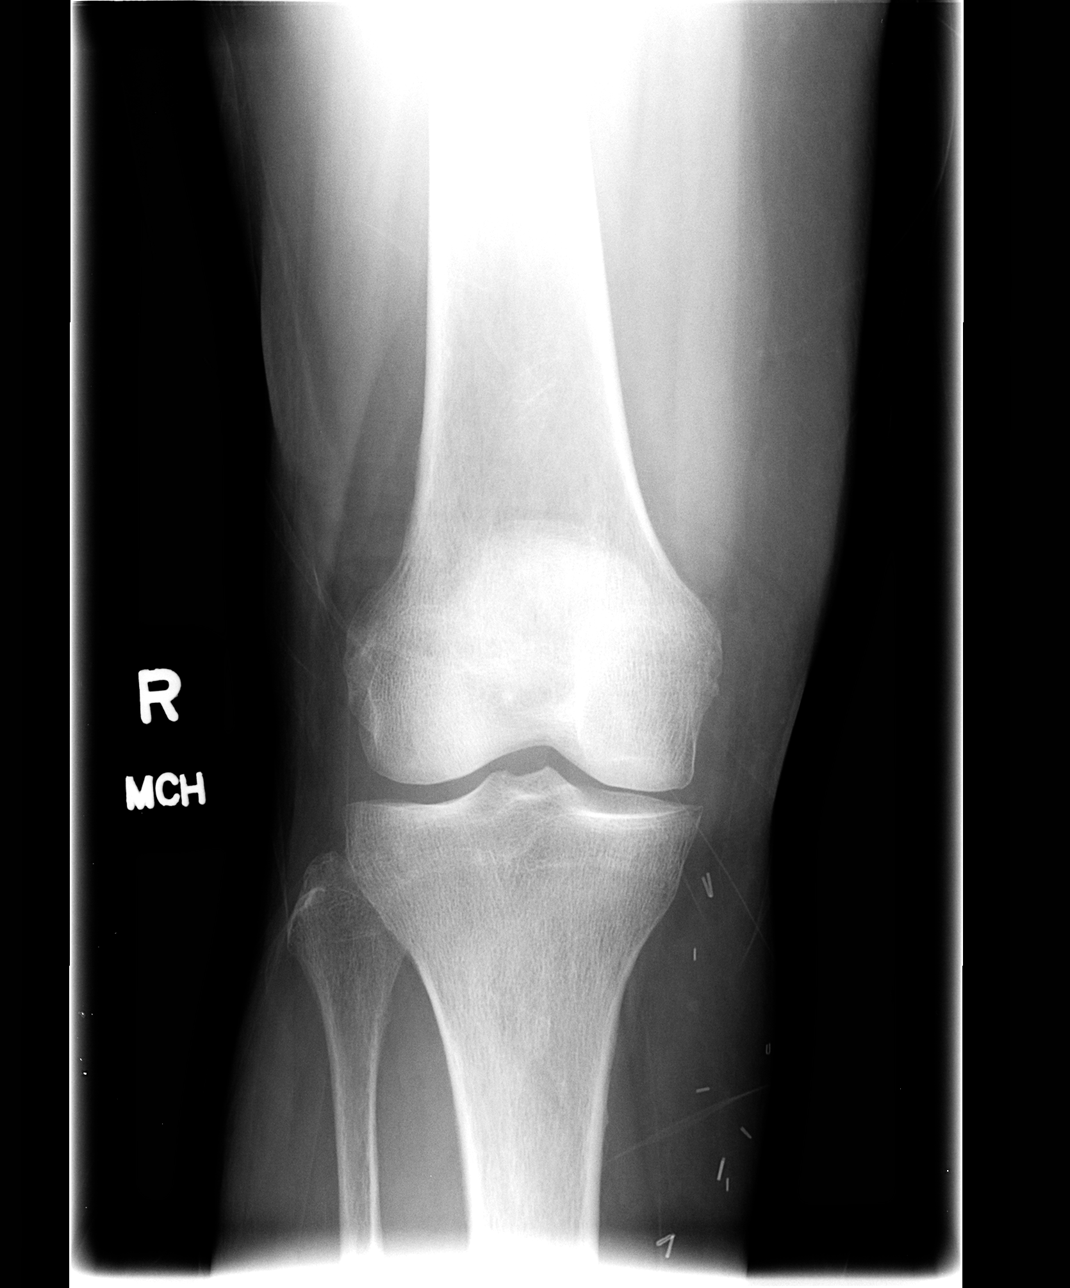

[view not recorded (2 of 4)]
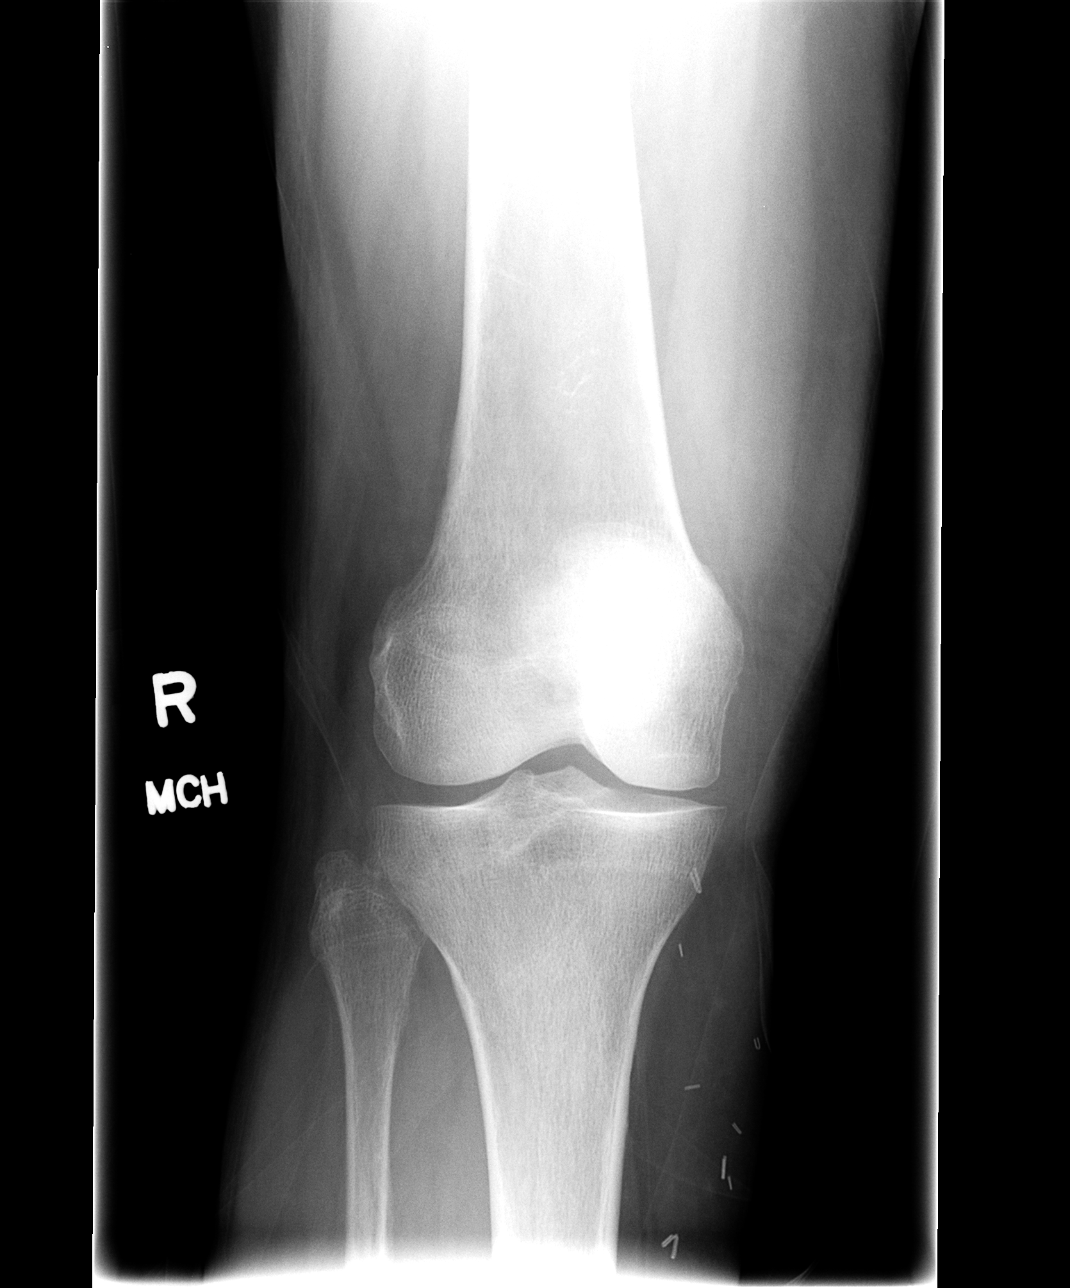

[view not recorded (3 of 4)]
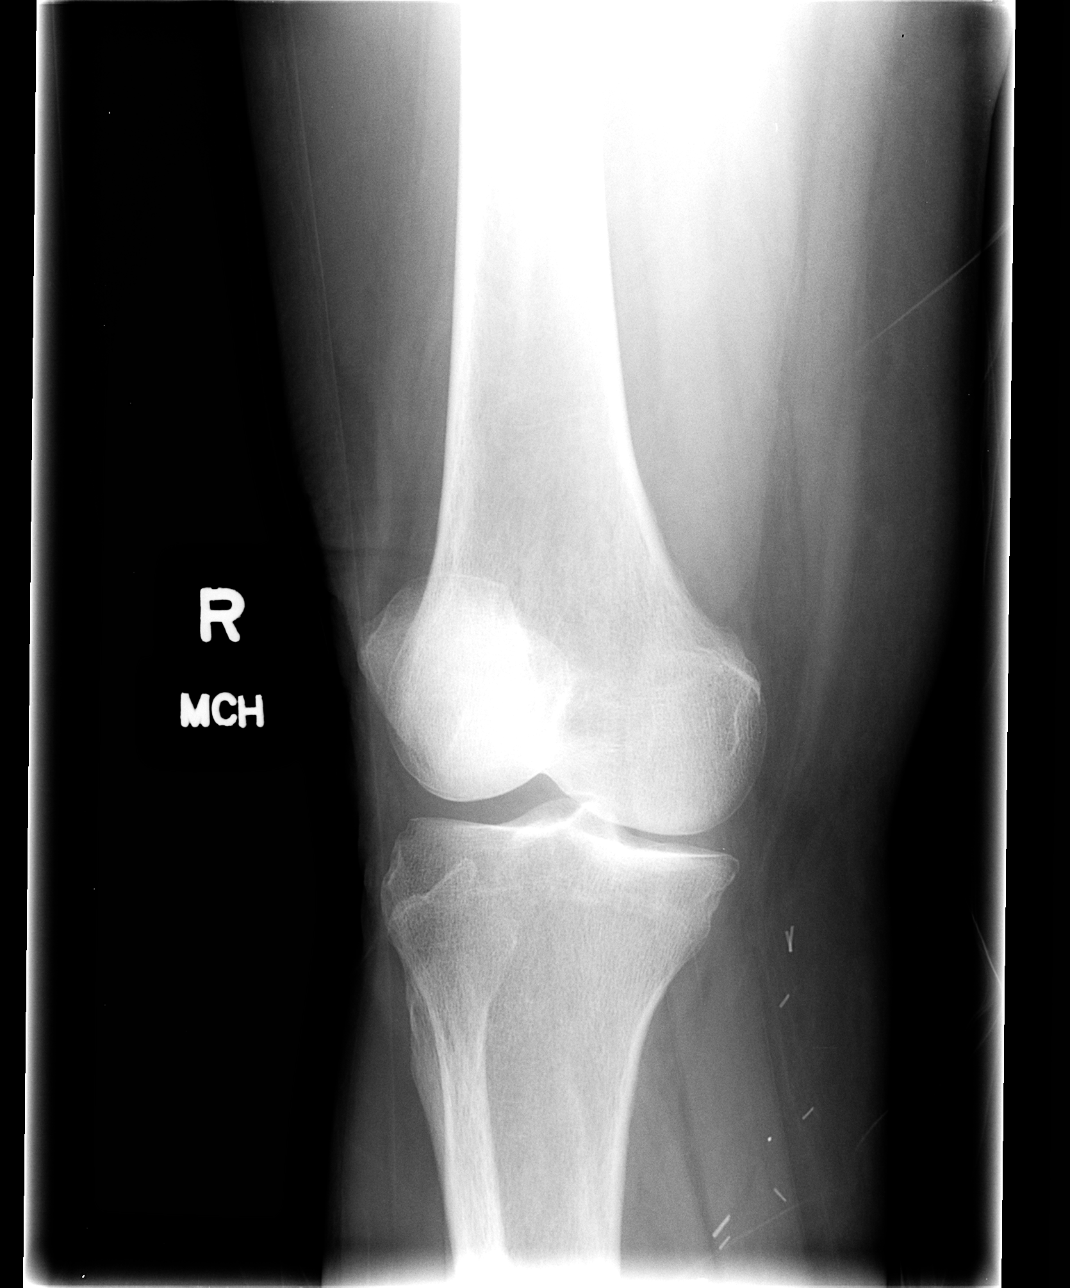

[view not recorded (4 of 4)]
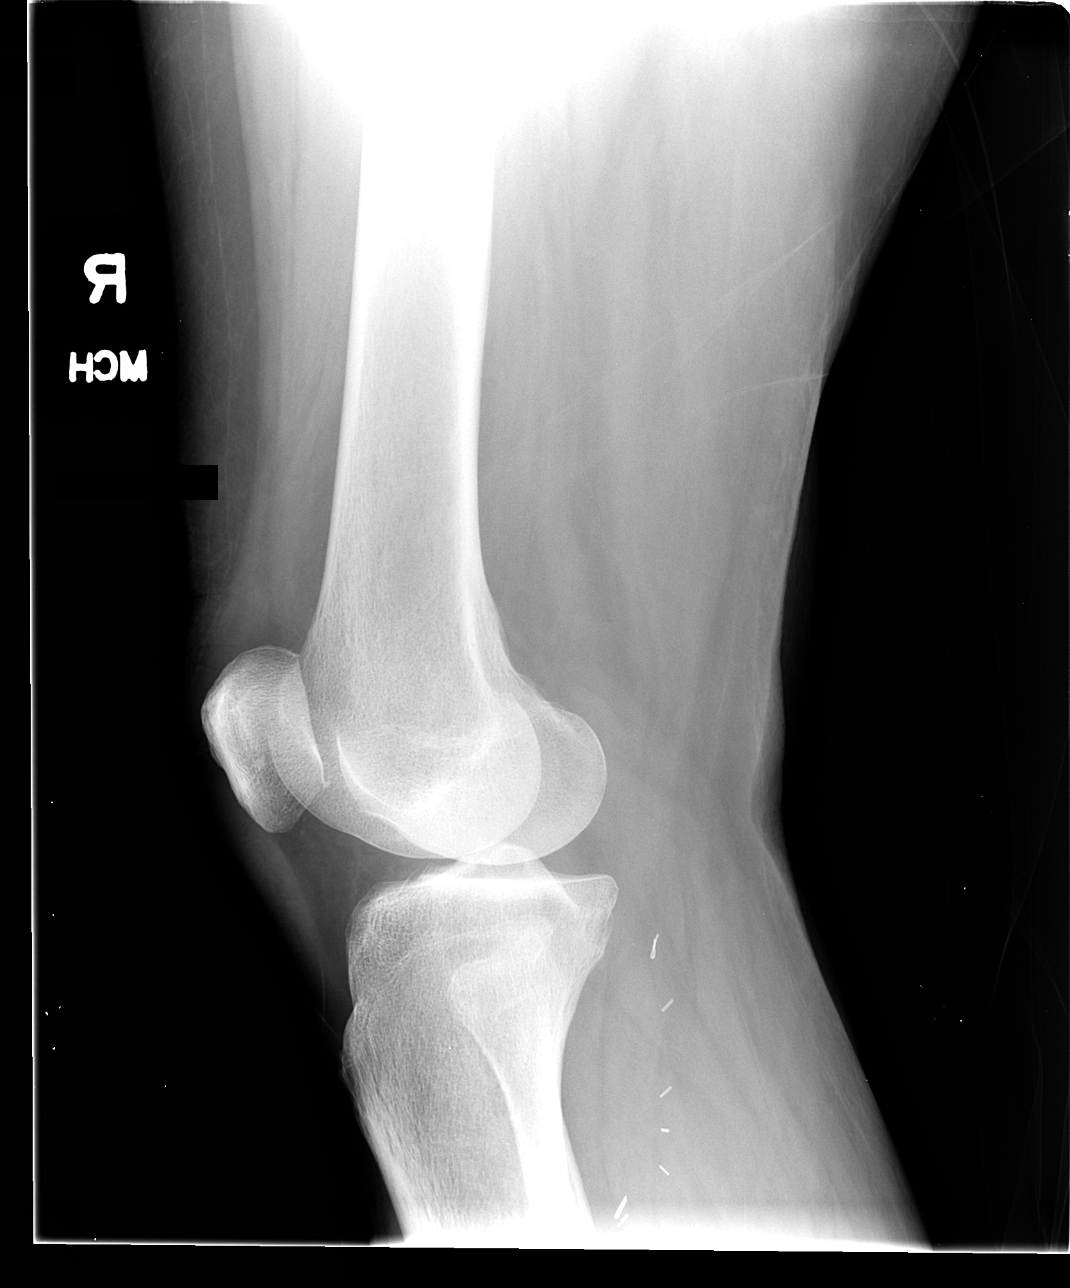

[4 of 4 positions shown; findings below may reference images not displayed]

FINDINGS: Surgical clips at medial aspect of right lower leg question related
to prior bypass surgery or saphenous vein harvest.
Bones appear demineralized.
Joint spaces preserved.
No acute fracture, dislocation or bone destruction.
IMPRESSION: No acute osseous abnormalities.

## 2014-10-11 IMAGING — CR DG KNEE COMPLETE 4+V*L*
4 series · 4 of 4 positions shown · non-contrast
Comparison: None;

CLINICAL DATA: Pain, prior left knee surgery

LEFT KNEE - COMPLETE 4+ VIEW

[view not recorded (1 of 4)]
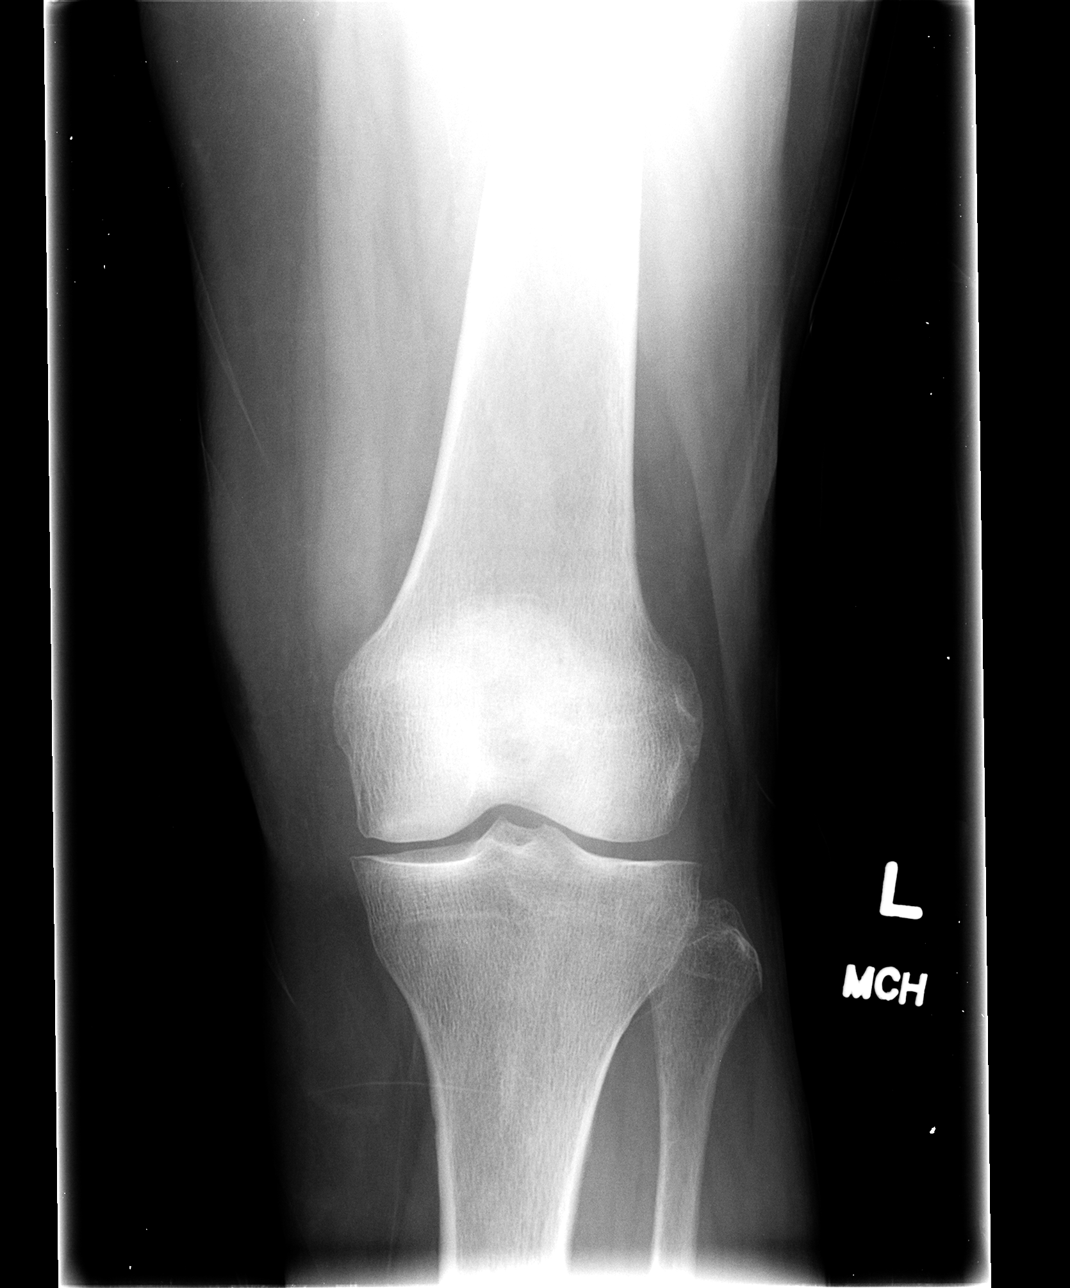

[view not recorded (2 of 4)]
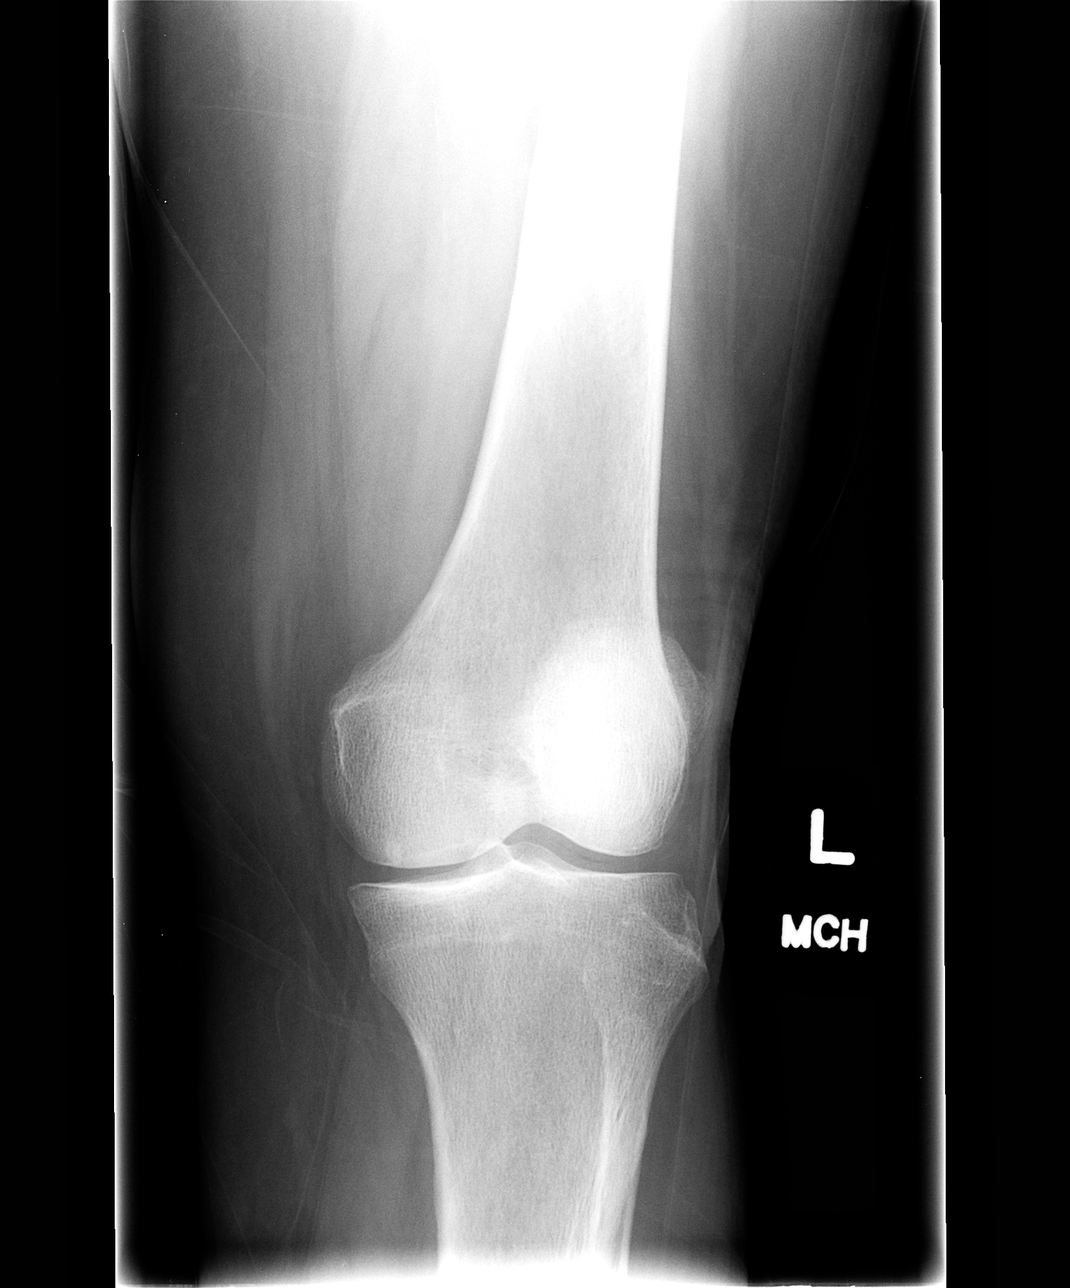

[view not recorded (3 of 4)]
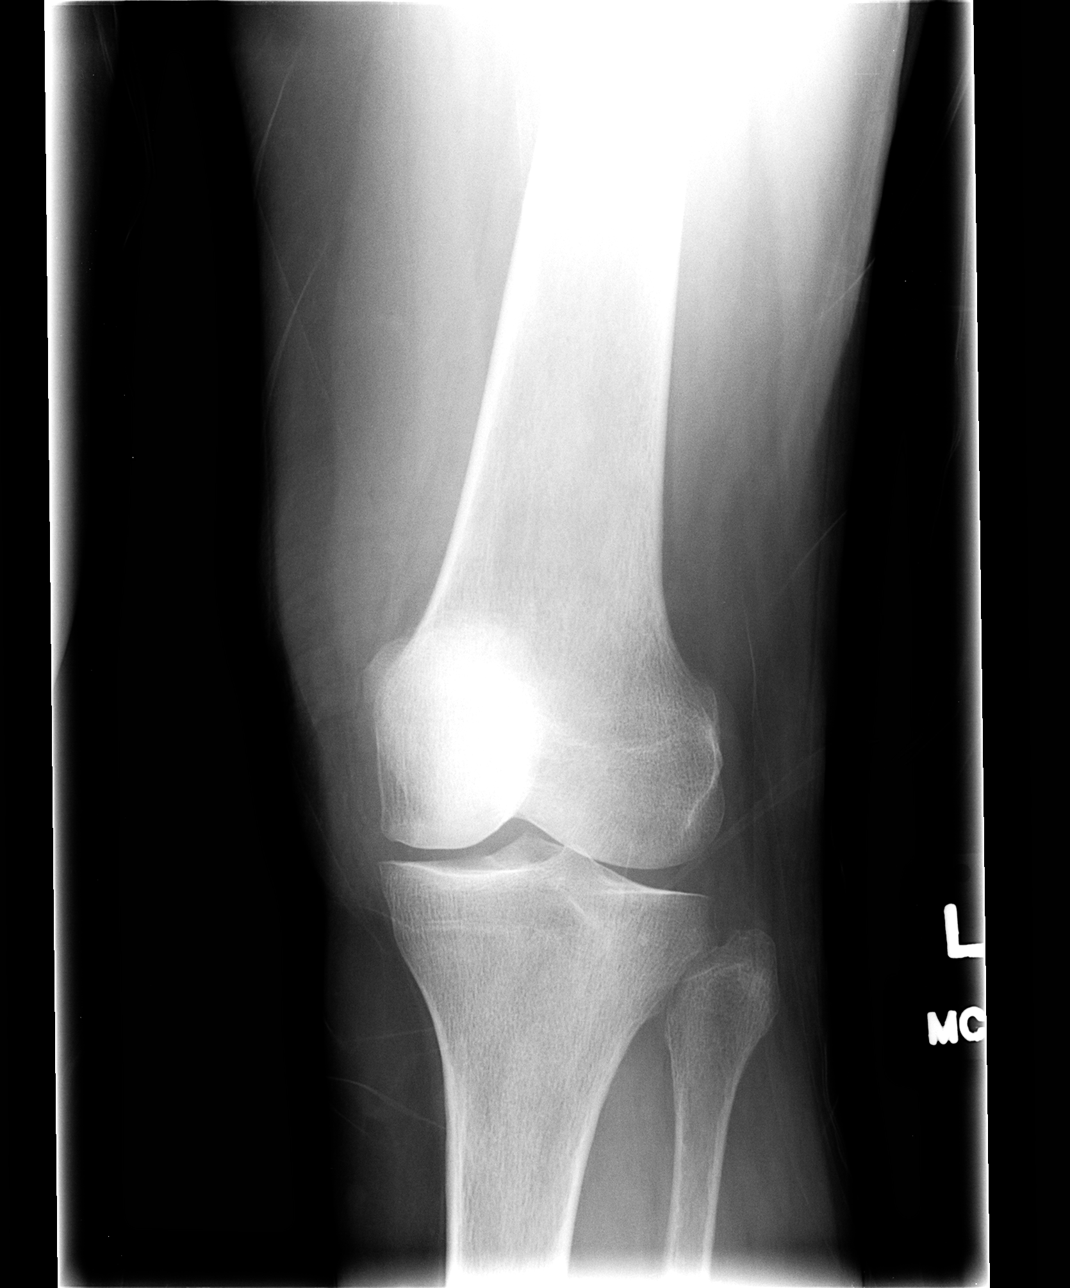

[view not recorded (4 of 4)]
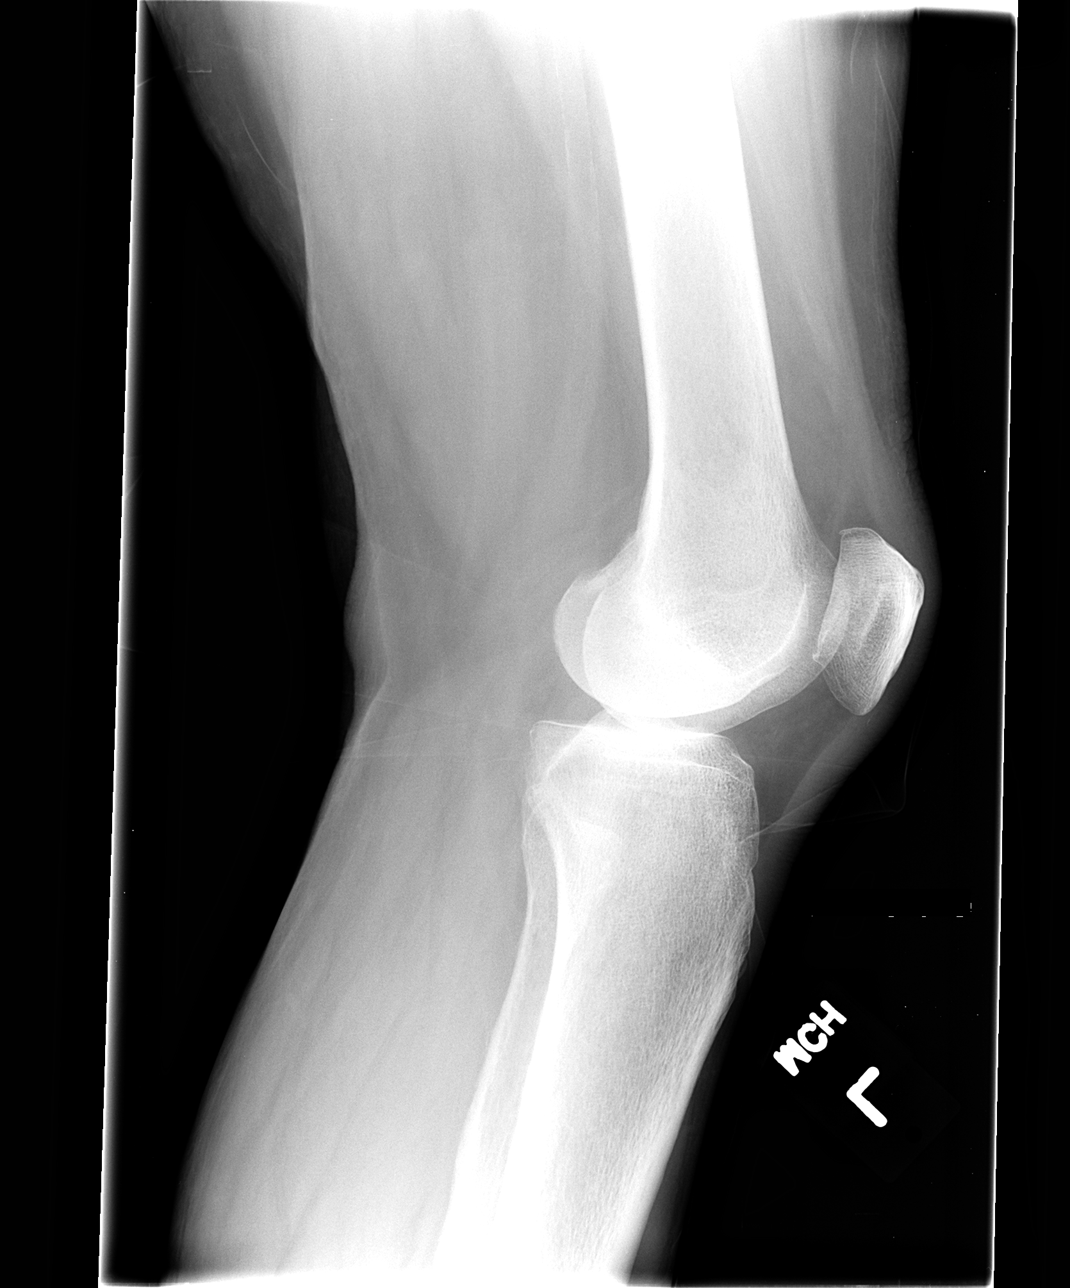

[4 of 4 positions shown; findings below may reference images not displayed]

prior exam from 09/02/2003 predates PACs and is
no longer available for comparison
FINDINGS: Mild osseous demineralization.
Minimal medial compartment joint space narrowing.
No acute fracture, dislocation or bone destruction.
Tiny patellar spurs.
IMPRESSION: Minimal degenerative changes.
No acute abnormalities.

## 2014-10-11 IMAGING — CR DG LUMBAR SPINE COMPLETE 4+V
5 series · 5 of 5 positions shown · non-contrast
Comparison: 10/11/2010

CLINICAL DATA: Pain, history of a ruptured disc 15 years ago

LUMBAR SPINE - COMPLETE 4+ VIEW

[view not recorded (1 of 5)]
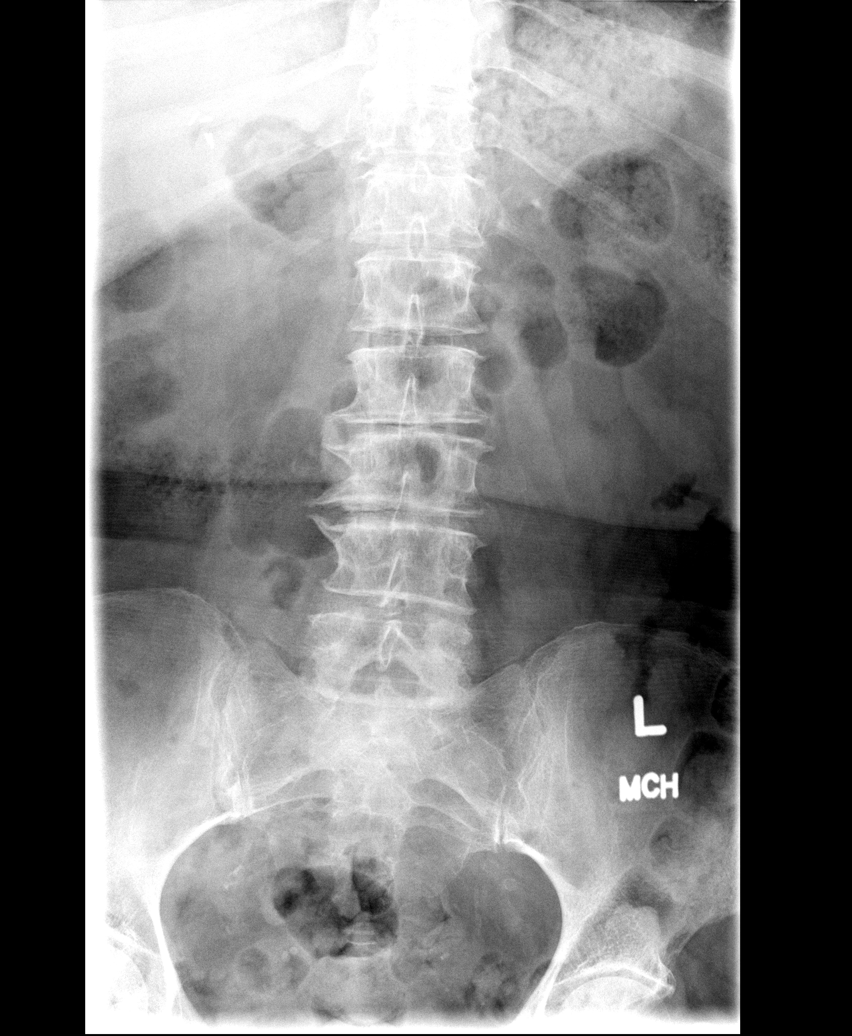

[view not recorded (2 of 5)]
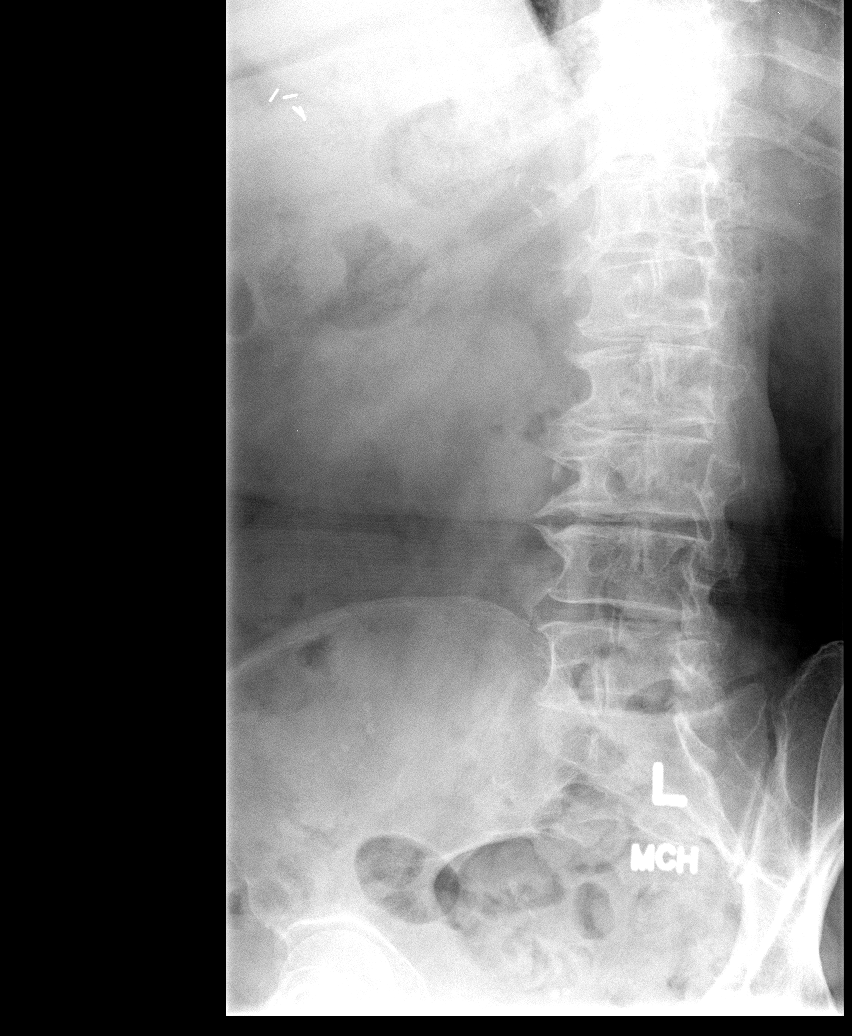

[view not recorded (3 of 5)]
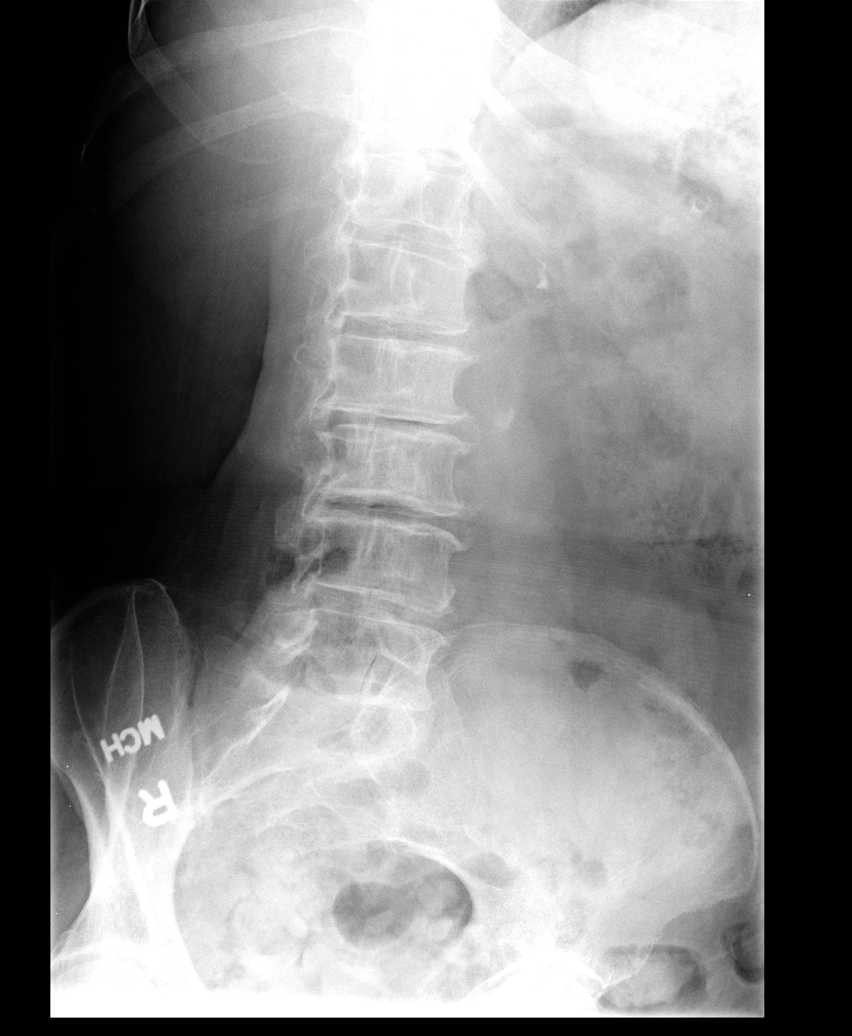

[view not recorded (4 of 5)]
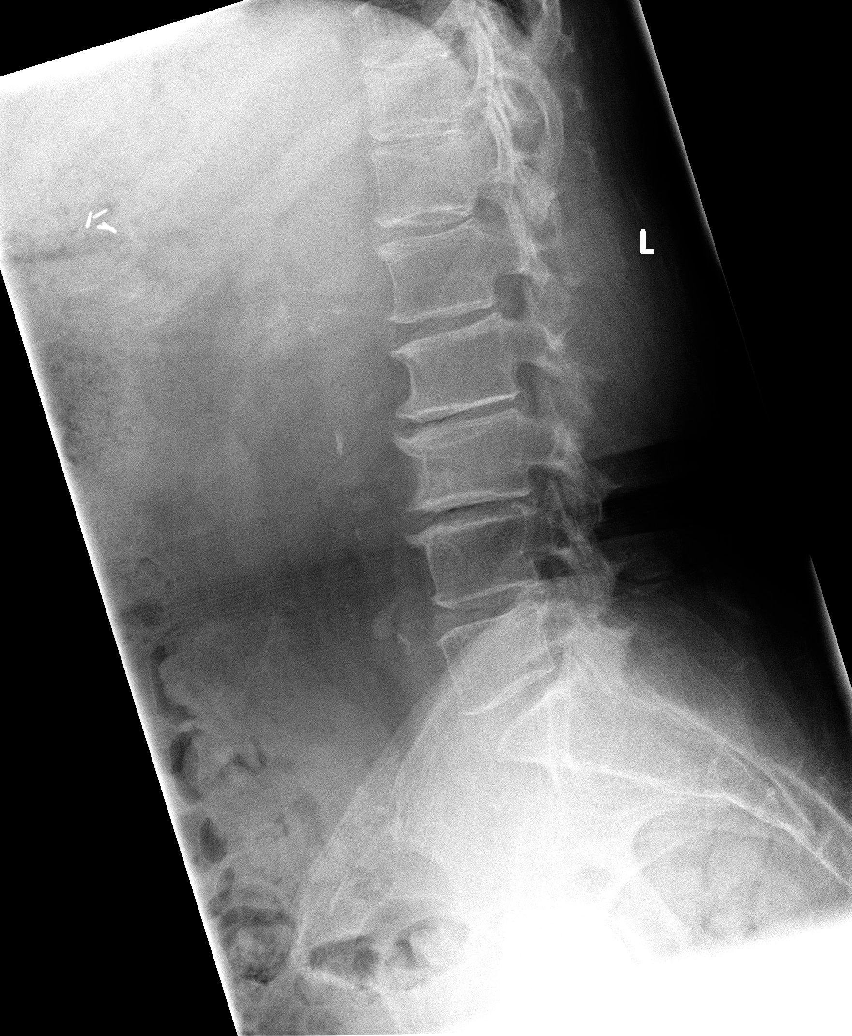

[view not recorded (5 of 5)]
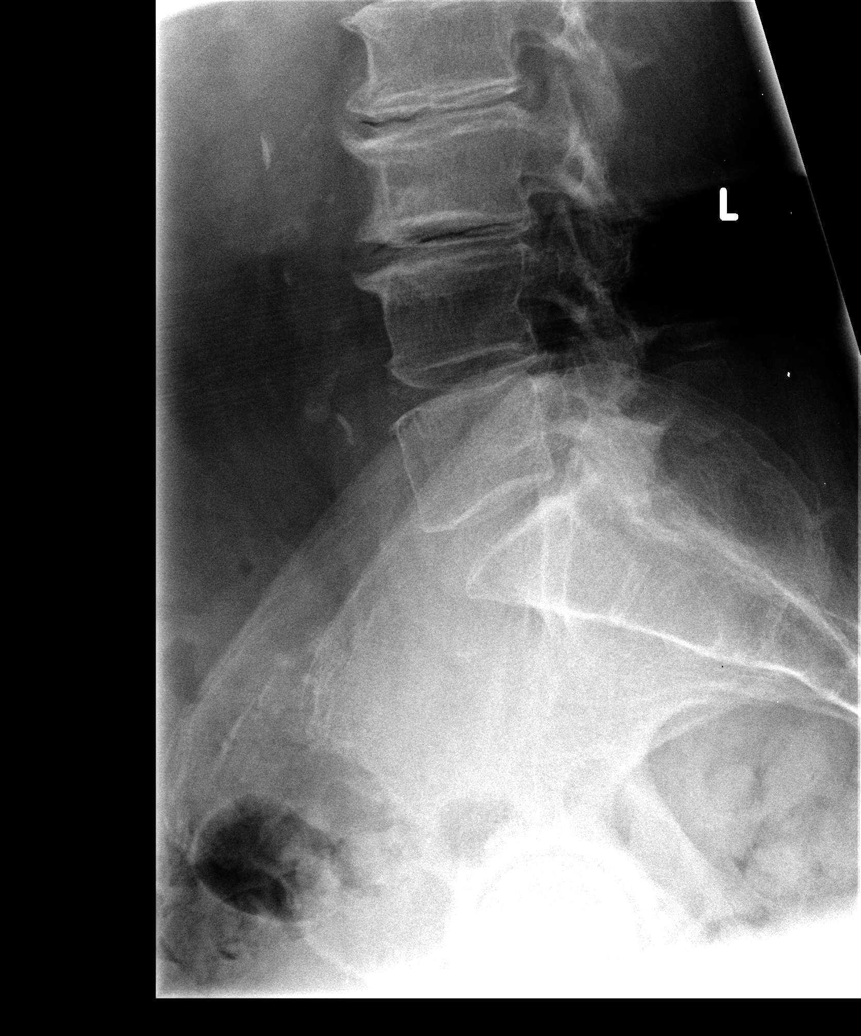

[5 of 5 positions shown; findings below may reference images not displayed]

FINDINGS: Osseous demineralization.
Five non-rib bearing lumbar vertebrae.
Multilevel disc space narrowing endplate spur formation.
Minimal facet degenerative changes lower lumbar spine.
Minimal retrolisthesis L3-L4, unchanged.
Vacuum phenomenon at L2-L3 L3-L4.
No fracture, additional subluxation or bone destruction.
No spondylolysis.
SI joints symmetric.
Minimal atherosclerotic calcification aorta.
IMPRESSION: Osseous demineralization with multilevel degenerative disc and to a
lesser degree facet disease changes of the lumbar spine as above.
No acute abnormalities or significant interval change.

## 2014-10-11 IMAGING — CR DG HIP W/ PELVIS BILAT
5 series · 5 of 5 positions shown · non-contrast
Comparison: Left hip radiographs 06/11/2012

CLINICAL DATA: Pain

BILATERAL HIP WITH PELVIS - 4+ VIEW

[view not recorded (1 of 5)]
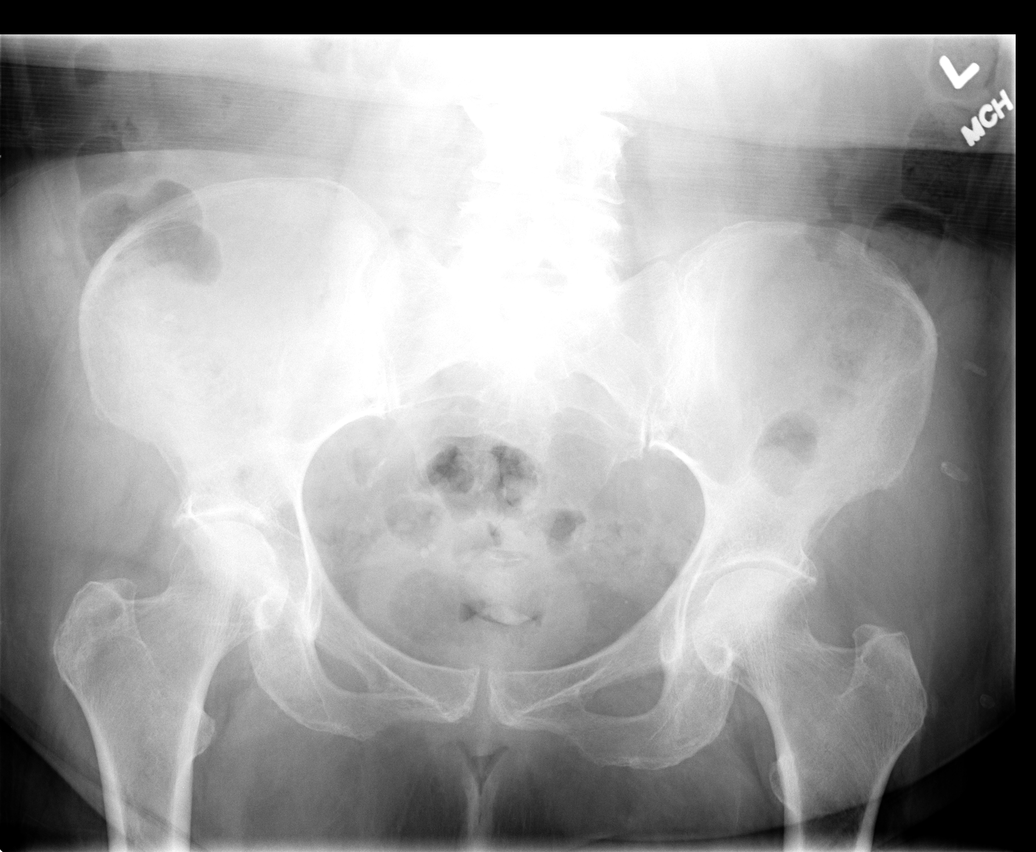

[view not recorded (2 of 5)]
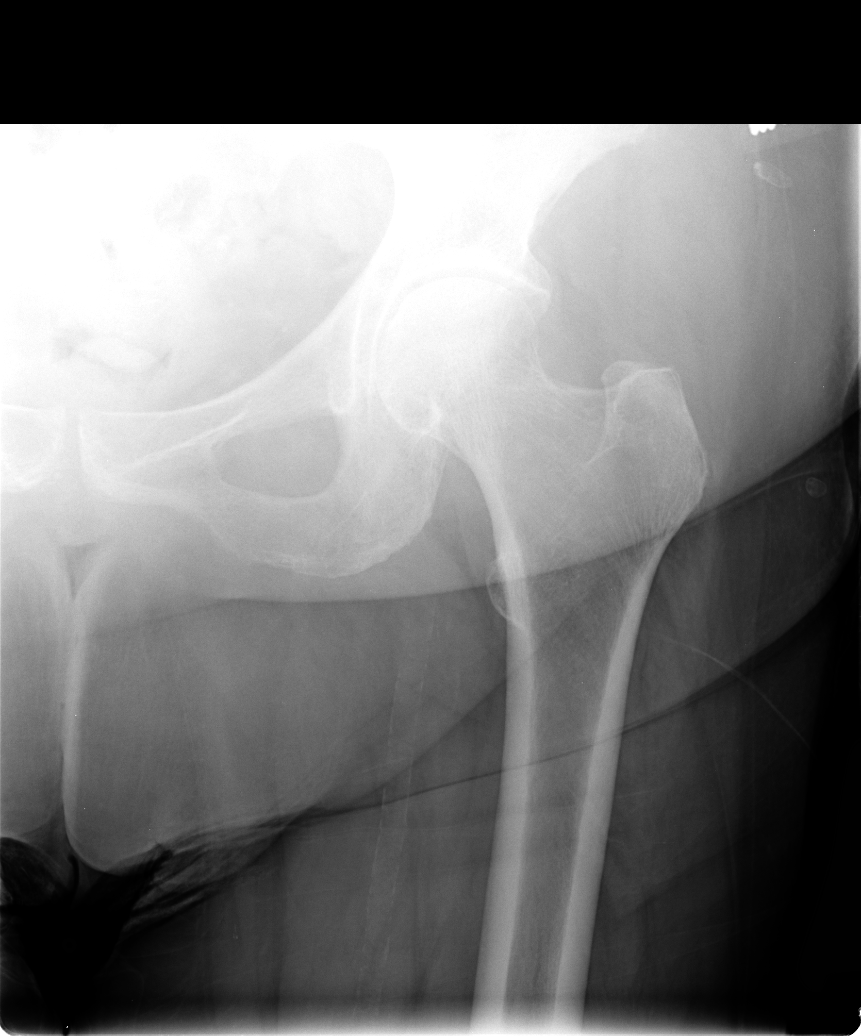

[view not recorded (3 of 5)]
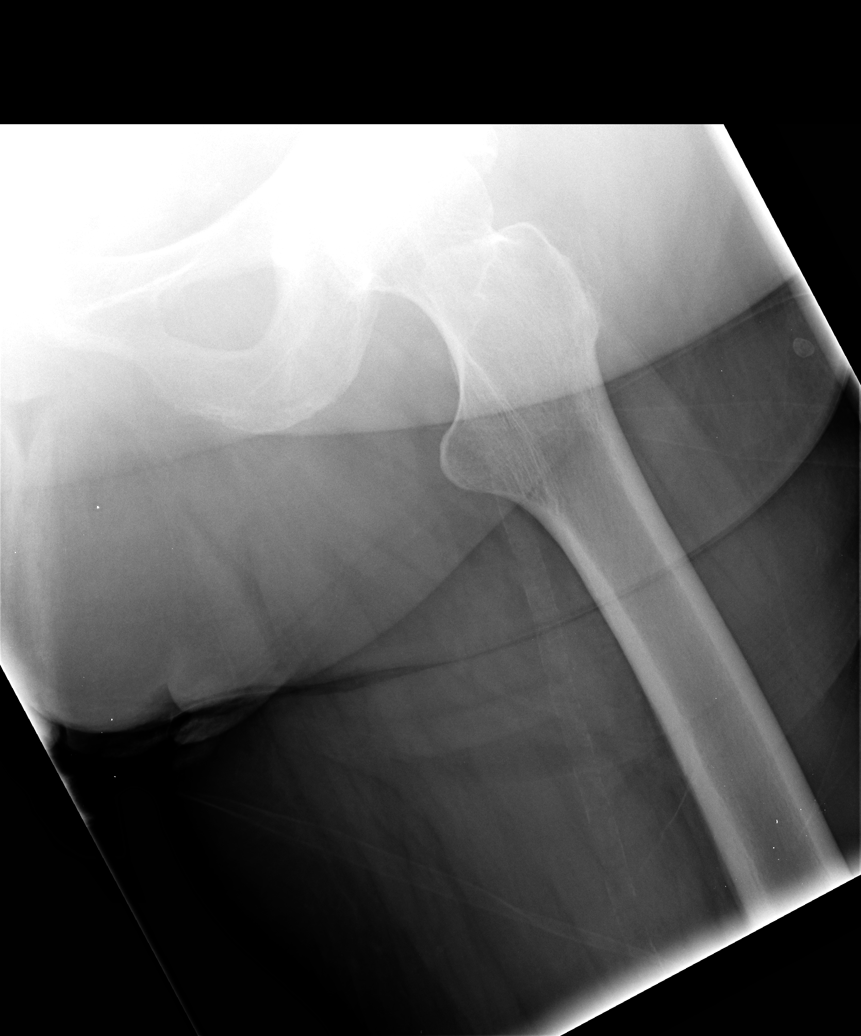

[view not recorded (4 of 5)]
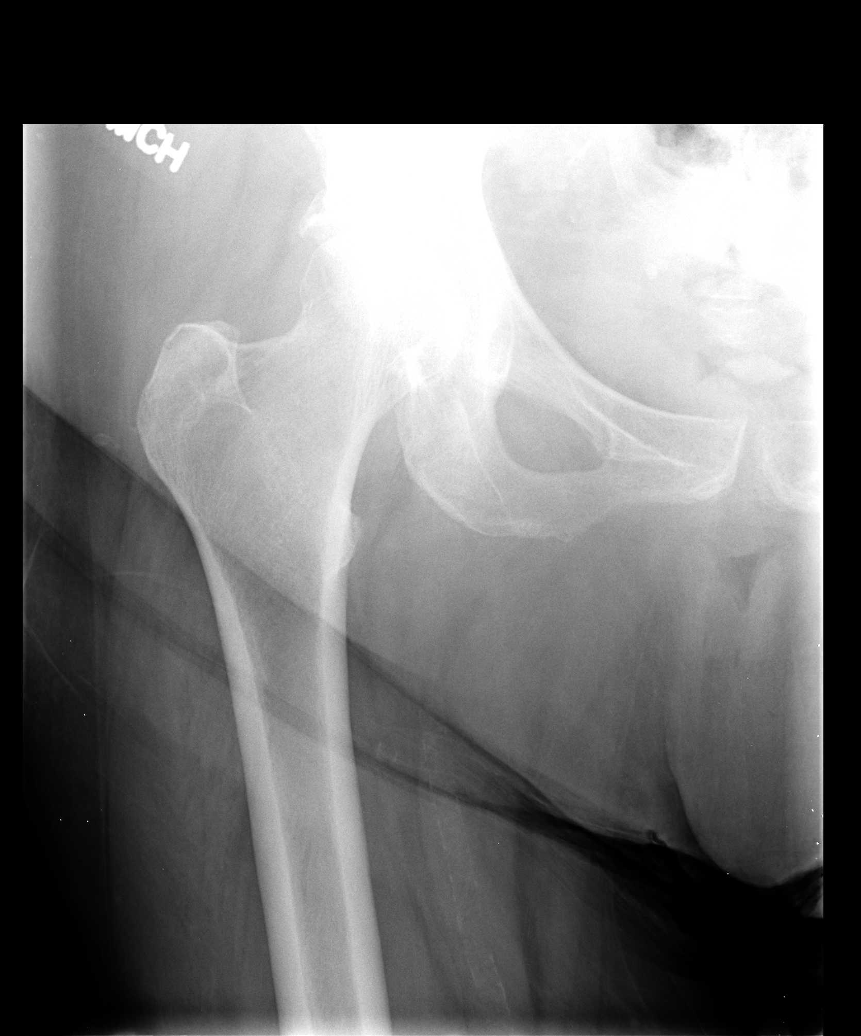

[view not recorded (5 of 5)]
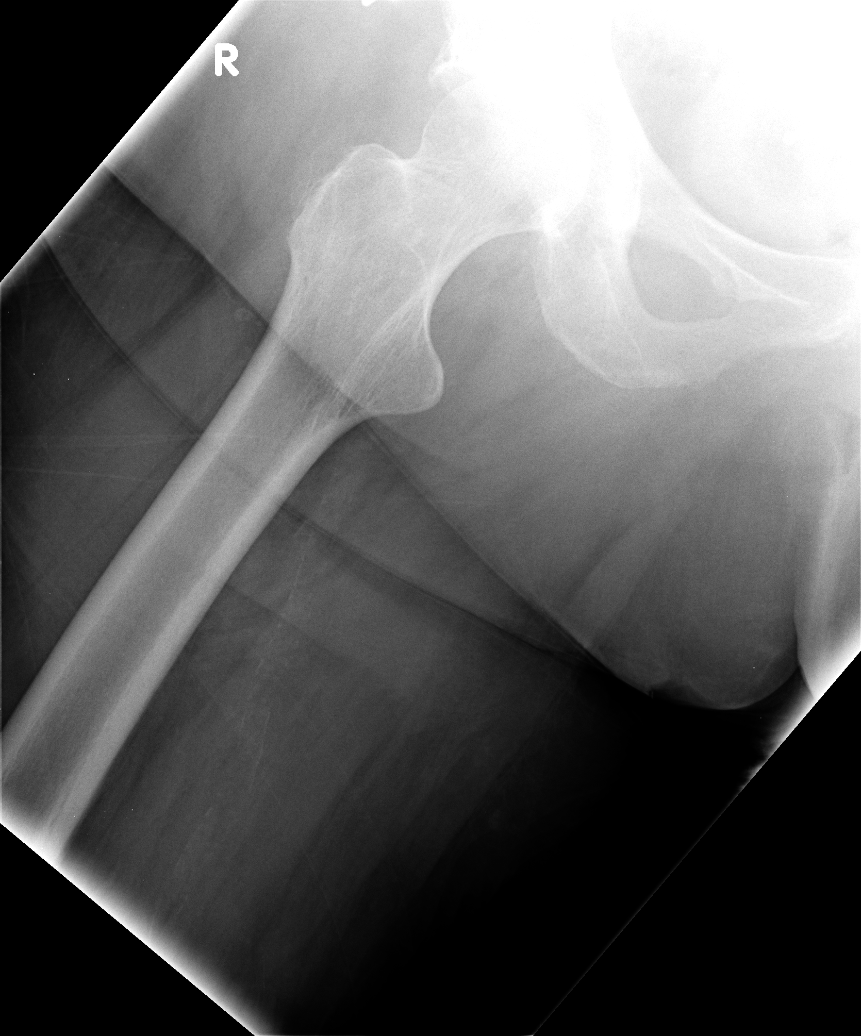

[5 of 5 positions shown; findings below may reference images not displayed]

FINDINGS: Osseous demineralization.
Symmetric hip and SI joints.
No acute fracture, dislocation or bone destruction.
Few left pelvic phleboliths.
Scattered atherosclerotic calcification.
IMPRESSION: Osseous demineralization.
No acute abnormalities

## 2014-10-14 ENCOUNTER — Ambulatory Visit (INDEPENDENT_AMBULATORY_CARE_PROVIDER_SITE_OTHER): Payer: Medicare Other | Admitting: Urology

## 2014-10-14 DIAGNOSIS — N3010 Interstitial cystitis (chronic) without hematuria: Secondary | ICD-10-CM

## 2014-10-21 ENCOUNTER — Ambulatory Visit (INDEPENDENT_AMBULATORY_CARE_PROVIDER_SITE_OTHER): Payer: Medicare Other | Admitting: Urology

## 2014-10-21 DIAGNOSIS — N3010 Interstitial cystitis (chronic) without hematuria: Secondary | ICD-10-CM

## 2014-10-28 ENCOUNTER — Ambulatory Visit (INDEPENDENT_AMBULATORY_CARE_PROVIDER_SITE_OTHER): Payer: Medicare Other | Admitting: Urology

## 2014-10-28 DIAGNOSIS — N3010 Interstitial cystitis (chronic) without hematuria: Secondary | ICD-10-CM

## 2014-11-10 ENCOUNTER — Ambulatory Visit (HOSPITAL_COMMUNITY)
Admission: RE | Admit: 2014-11-10 | Discharge: 2014-11-10 | Disposition: A | Discharge status: Home / Self Care | Payer: Medicare Other | Source: Ambulatory Visit | Attending: Family Medicine | Admitting: Family Medicine

## 2014-11-10 ENCOUNTER — Other Ambulatory Visit (HOSPITAL_COMMUNITY): Payer: Self-pay | Admitting: Family Medicine

## 2014-11-10 DIAGNOSIS — R509 Fever, unspecified: Secondary | ICD-10-CM | POA: Insufficient documentation

## 2014-11-10 DIAGNOSIS — J449 Chronic obstructive pulmonary disease, unspecified: Secondary | ICD-10-CM | POA: Insufficient documentation

## 2014-11-10 DIAGNOSIS — R05 Cough: Secondary | ICD-10-CM

## 2014-11-10 DIAGNOSIS — R059 Cough, unspecified: Secondary | ICD-10-CM

## 2014-11-10 DIAGNOSIS — J45909 Unspecified asthma, uncomplicated: Secondary | ICD-10-CM | POA: Diagnosis not present

## 2014-11-10 DIAGNOSIS — R0602 Shortness of breath: Secondary | ICD-10-CM | POA: Diagnosis not present

## 2014-11-11 ENCOUNTER — Ambulatory Visit (INDEPENDENT_AMBULATORY_CARE_PROVIDER_SITE_OTHER): Payer: Medicare Other | Admitting: Urology

## 2014-11-11 DIAGNOSIS — N3020 Other chronic cystitis without hematuria: Secondary | ICD-10-CM

## 2014-11-13 ENCOUNTER — Encounter (HOSPITAL_COMMUNITY): Payer: Self-pay | Admitting: Emergency Medicine

## 2014-11-13 ENCOUNTER — Emergency Department (HOSPITAL_COMMUNITY): Payer: Medicare Other

## 2014-11-13 ENCOUNTER — Inpatient Hospital Stay (HOSPITAL_COMMUNITY)
Admission: EM | Admit: 2014-11-13 | Discharge: 2014-11-16 | DRG: 190 | Disposition: A | Discharge status: Home / Self Care | Payer: Medicare Other | Attending: Family Medicine | Admitting: Family Medicine

## 2014-11-13 DIAGNOSIS — Z7982 Long term (current) use of aspirin: Secondary | ICD-10-CM

## 2014-11-13 DIAGNOSIS — Z951 Presence of aortocoronary bypass graft: Secondary | ICD-10-CM | POA: Diagnosis not present

## 2014-11-13 DIAGNOSIS — J40 Bronchitis, not specified as acute or chronic: Secondary | ICD-10-CM | POA: Diagnosis present

## 2014-11-13 DIAGNOSIS — Z9119 Patient's noncompliance with other medical treatment and regimen: Secondary | ICD-10-CM | POA: Diagnosis present

## 2014-11-13 DIAGNOSIS — Z9861 Coronary angioplasty status: Secondary | ICD-10-CM

## 2014-11-13 DIAGNOSIS — I10 Essential (primary) hypertension: Secondary | ICD-10-CM | POA: Diagnosis present

## 2014-11-13 DIAGNOSIS — G4733 Obstructive sleep apnea (adult) (pediatric): Secondary | ICD-10-CM | POA: Diagnosis present

## 2014-11-13 DIAGNOSIS — E039 Hypothyroidism, unspecified: Secondary | ICD-10-CM | POA: Diagnosis present

## 2014-11-13 DIAGNOSIS — Z9841 Cataract extraction status, right eye: Secondary | ICD-10-CM

## 2014-11-13 DIAGNOSIS — J441 Chronic obstructive pulmonary disease with (acute) exacerbation: Secondary | ICD-10-CM | POA: Diagnosis present

## 2014-11-13 DIAGNOSIS — R0602 Shortness of breath: Secondary | ICD-10-CM

## 2014-11-13 DIAGNOSIS — Z794 Long term (current) use of insulin: Secondary | ICD-10-CM

## 2014-11-13 DIAGNOSIS — I252 Old myocardial infarction: Secondary | ICD-10-CM

## 2014-11-13 DIAGNOSIS — E785 Hyperlipidemia, unspecified: Secondary | ICD-10-CM | POA: Diagnosis present

## 2014-11-13 DIAGNOSIS — Z8673 Personal history of transient ischemic attack (TIA), and cerebral infarction without residual deficits: Secondary | ICD-10-CM

## 2014-11-13 DIAGNOSIS — I8390 Asymptomatic varicose veins of unspecified lower extremity: Secondary | ICD-10-CM | POA: Diagnosis present

## 2014-11-13 DIAGNOSIS — Z6832 Body mass index (BMI) 32.0-32.9, adult: Secondary | ICD-10-CM

## 2014-11-13 DIAGNOSIS — I2510 Atherosclerotic heart disease of native coronary artery without angina pectoris: Secondary | ICD-10-CM | POA: Diagnosis present

## 2014-11-13 DIAGNOSIS — J9621 Acute and chronic respiratory failure with hypoxia: Secondary | ICD-10-CM | POA: Diagnosis present

## 2014-11-13 DIAGNOSIS — Z9842 Cataract extraction status, left eye: Secondary | ICD-10-CM

## 2014-11-13 DIAGNOSIS — M5136 Other intervertebral disc degeneration, lumbar region: Secondary | ICD-10-CM | POA: Diagnosis present

## 2014-11-13 DIAGNOSIS — M1990 Unspecified osteoarthritis, unspecified site: Secondary | ICD-10-CM | POA: Diagnosis present

## 2014-11-13 DIAGNOSIS — E669 Obesity, unspecified: Secondary | ICD-10-CM | POA: Diagnosis present

## 2014-11-13 DIAGNOSIS — Z885 Allergy status to narcotic agent status: Secondary | ICD-10-CM | POA: Diagnosis not present

## 2014-11-13 DIAGNOSIS — G4730 Sleep apnea, unspecified: Secondary | ICD-10-CM | POA: Diagnosis present

## 2014-11-13 DIAGNOSIS — R0902 Hypoxemia: Secondary | ICD-10-CM

## 2014-11-13 DIAGNOSIS — I5032 Chronic diastolic (congestive) heart failure: Secondary | ICD-10-CM | POA: Diagnosis present

## 2014-11-13 DIAGNOSIS — E119 Type 2 diabetes mellitus without complications: Secondary | ICD-10-CM | POA: Diagnosis present

## 2014-11-13 DIAGNOSIS — E118 Type 2 diabetes mellitus with unspecified complications: Secondary | ICD-10-CM

## 2014-11-13 LAB — CBC
HCT: 40 % (ref 36.0–46.0)
HEMOGLOBIN: 12.2 g/dL (ref 12.0–15.0)
MCH: 23.3 pg — ABNORMAL LOW (ref 26.0–34.0)
MCHC: 30.5 g/dL (ref 30.0–36.0)
MCV: 76.3 fL — ABNORMAL LOW (ref 78.0–100.0)
PLATELETS: 296 10*3/uL (ref 150–400)
RBC: 5.24 MIL/uL — ABNORMAL HIGH (ref 3.87–5.11)
RDW: 17 % — ABNORMAL HIGH (ref 11.5–15.5)
WBC: 9.6 10*3/uL (ref 4.0–10.5)

## 2014-11-13 LAB — BLOOD GAS, ARTERIAL
ACID-BASE DEFICIT: 0.2 mmol/L (ref 0.0–2.0)
Bicarbonate: 24.1 mEq/L — ABNORMAL HIGH (ref 20.0–24.0)
DRAWN BY: 23588
O2 CONTENT: 3 L/min
O2 Saturation: 93.3 %
PCO2 ART: 40.8 mmHg (ref 35.0–45.0)
Patient temperature: 37
TCO2: 21.9 mmol/L (ref 0–100)
pH, Arterial: 7.389 (ref 7.350–7.450)
pO2, Arterial: 82 mmHg (ref 80.0–100.0)

## 2014-11-13 LAB — BASIC METABOLIC PANEL
ANION GAP: 12 (ref 5–15)
BUN: 11 mg/dL (ref 6–23)
CO2: 29 mEq/L (ref 19–32)
CREATININE: 0.68 mg/dL (ref 0.50–1.10)
Calcium: 9.6 mg/dL (ref 8.4–10.5)
Chloride: 96 mEq/L (ref 96–112)
GFR, EST NON AFRICAN AMERICAN: 83 mL/min — AB (ref >90)
Glucose, Bld: 241 mg/dL — ABNORMAL HIGH (ref 70–99)
POTASSIUM: 4.1 meq/L (ref 3.7–5.3)
Sodium: 137 mEq/L (ref 137–147)

## 2014-11-13 LAB — GLUCOSE, CAPILLARY
Glucose-Capillary: 291 mg/dL — ABNORMAL HIGH (ref 70–99)
Glucose-Capillary: 434 mg/dL — ABNORMAL HIGH (ref 70–99)
Glucose-Capillary: 370 mg/dL — ABNORMAL HIGH (ref 70–99)

## 2014-11-13 LAB — INFLUENZA PANEL BY PCR (TYPE A & B)
H1N1FLUPCR: NOT DETECTED
INFLAPCR: NEGATIVE
INFLBPCR: NEGATIVE

## 2014-11-13 LAB — PRO B NATRIURETIC PEPTIDE: PRO B NATRI PEPTIDE: 88 pg/mL (ref 0–450)

## 2014-11-13 LAB — MRSA PCR SCREENING: MRSA BY PCR: NEGATIVE

## 2014-11-13 MED ORDER — IPRATROPIUM BROMIDE 0.02 % IN SOLN
0.5000 mg | Freq: Once | RESPIRATORY_TRACT | Status: AC
  Administered 2014-11-13: 0.5 mg via RESPIRATORY_TRACT
  Filled 2014-11-13: qty 2.5

## 2014-11-13 MED ORDER — OXYCODONE HCL 5 MG PO TABS
5.0000 mg | ORAL_TABLET | ORAL | Status: DC | PRN
  Administered 2014-11-13 – 2014-11-14 (×3): 5 mg via ORAL
  Filled 2014-11-13 (×4): qty 1

## 2014-11-13 MED ORDER — DESIPRAMINE HCL 10 MG PO TABS: 10.0000 mg | ORAL_TABLET | Freq: Every day | ORAL | Status: DC

## 2014-11-13 MED ORDER — ZOLPIDEM TARTRATE 5 MG PO TABS
5.0000 mg | ORAL_TABLET | Freq: Every day | ORAL | Status: DC
  Administered 2014-11-13 – 2014-11-15 (×3): 5 mg via ORAL
  Filled 2014-11-13 (×4): qty 1

## 2014-11-13 MED ORDER — LORAZEPAM 2 MG/ML IJ SOLN
INTRAMUSCULAR | Status: AC
  Filled 2014-11-13: qty 1

## 2014-11-13 MED ORDER — INSULIN DETEMIR 100 UNIT/ML ~~LOC~~ SOLN: 25.0000 [IU] | Freq: Two times a day (BID) | SUBCUTANEOUS | Status: DC

## 2014-11-13 MED ORDER — ALBUTEROL (5 MG/ML) CONTINUOUS INHALATION SOLN
15.0000 mg/h | INHALATION_SOLUTION | Freq: Once | RESPIRATORY_TRACT | Status: AC
Start: 2014-11-13 — End: 2014-11-13
  Administered 2014-11-13: 15 mg/h via RESPIRATORY_TRACT

## 2014-11-13 MED ORDER — ONDANSETRON HCL 4 MG PO TABS: 4.0000 mg | ORAL_TABLET | Freq: Four times a day (QID) | ORAL | Status: DC | PRN

## 2014-11-13 MED ORDER — SENNOSIDES-DOCUSATE SODIUM 8.6-50 MG PO TABS
1.0000 | ORAL_TABLET | Freq: Every evening | ORAL | Status: DC | PRN
  Administered 2014-11-13 – 2014-11-15 (×3): 1 via ORAL
  Filled 2014-11-13 (×4): qty 1

## 2014-11-13 MED ORDER — IPRATROPIUM-ALBUTEROL 0.5-2.5 (3) MG/3ML IN SOLN
3.0000 mL | Freq: Four times a day (QID) | RESPIRATORY_TRACT | Status: DC
  Administered 2014-11-13 – 2014-11-16 (×11): 3 mL via RESPIRATORY_TRACT
  Filled 2014-11-13 (×11): qty 3

## 2014-11-13 MED ORDER — INSULIN ASPART 100 UNIT/ML ~~LOC~~ SOLN
6.0000 [IU] | Freq: Once | SUBCUTANEOUS | Status: AC
  Administered 2014-11-13: 6 [IU] via SUBCUTANEOUS

## 2014-11-13 MED ORDER — ENOXAPARIN SODIUM 40 MG/0.4ML ~~LOC~~ SOLN
40.0000 mg | SUBCUTANEOUS | Status: DC
  Administered 2014-11-13 – 2014-11-15 (×3): 40 mg via SUBCUTANEOUS
  Filled 2014-11-13 (×3): qty 0.4

## 2014-11-13 MED ORDER — INSULIN DETEMIR 100 UNIT/ML ~~LOC~~ SOLN
30.0000 [IU] | Freq: Every day | SUBCUTANEOUS | Status: DC
  Administered 2014-11-14 – 2014-11-15 (×2): 30 [IU] via SUBCUTANEOUS
  Filled 2014-11-13 (×3): qty 0.3

## 2014-11-13 MED ORDER — IPRATROPIUM-ALBUTEROL 0.5-2.5 (3) MG/3ML IN SOLN
3.0000 mL | Freq: Once | RESPIRATORY_TRACT | Status: AC
  Administered 2014-11-13: 3 mL via RESPIRATORY_TRACT
  Filled 2014-11-13: qty 3

## 2014-11-13 MED ORDER — ALBUTEROL SULFATE (2.5 MG/3ML) 0.083% IN NEBU
INHALATION_SOLUTION | RESPIRATORY_TRACT | Status: AC
  Administered 2014-11-13: 5 mg via RESPIRATORY_TRACT
  Filled 2014-11-13: qty 6

## 2014-11-13 MED ORDER — LEVOFLOXACIN 750 MG PO TABS
750.0000 mg | ORAL_TABLET | Freq: Every day | ORAL | Status: DC
  Administered 2014-11-14 – 2014-11-16 (×3): 750 mg via ORAL
  Filled 2014-11-13 (×3): qty 1

## 2014-11-13 MED ORDER — LEVOFLOXACIN IN D5W 750 MG/150ML IV SOLN
750.0000 mg | Freq: Once | INTRAVENOUS | Status: AC
  Administered 2014-11-13: 750 mg via INTRAVENOUS
  Filled 2014-11-13: qty 150

## 2014-11-13 MED ORDER — LOSARTAN POTASSIUM 50 MG PO TABS
50.0000 mg | ORAL_TABLET | Freq: Every day | ORAL | Status: DC
  Administered 2014-11-14 – 2014-11-16 (×3): 50 mg via ORAL
  Filled 2014-11-13 (×3): qty 1

## 2014-11-13 MED ORDER — IPRATROPIUM BROMIDE 0.02 % IN SOLN: 0.5000 mg | Freq: Four times a day (QID) | RESPIRATORY_TRACT | Status: DC

## 2014-11-13 MED ORDER — TRAMADOL HCL 50 MG PO TABS
50.0000 mg | ORAL_TABLET | Freq: Four times a day (QID) | ORAL | Status: DC | PRN
  Filled 2014-11-13: qty 1

## 2014-11-13 MED ORDER — INSULIN ASPART 100 UNIT/ML ~~LOC~~ SOLN
3.0000 [IU] | Freq: Three times a day (TID) | SUBCUTANEOUS | Status: DC
  Administered 2014-11-13 – 2014-11-15 (×7): 3 [IU] via SUBCUTANEOUS

## 2014-11-13 MED ORDER — SODIUM CHLORIDE 0.9 % IJ SOLN
3.0000 mL | Freq: Two times a day (BID) | INTRAMUSCULAR | Status: DC
  Administered 2014-11-13 – 2014-11-16 (×6): 3 mL via INTRAVENOUS

## 2014-11-13 MED ORDER — NITROGLYCERIN 0.4 MG SL SUBL: 0.4000 mg | SUBLINGUAL_TABLET | SUBLINGUAL | Status: DC | PRN

## 2014-11-13 MED ORDER — ALBUTEROL (5 MG/ML) CONTINUOUS INHALATION SOLN
INHALATION_SOLUTION | RESPIRATORY_TRACT | Status: AC
  Filled 2014-11-13: qty 20

## 2014-11-13 MED ORDER — ALBUTEROL SULFATE (2.5 MG/3ML) 0.083% IN NEBU: 2.5000 mg | INHALATION_SOLUTION | Freq: Four times a day (QID) | RESPIRATORY_TRACT | Status: DC

## 2014-11-13 MED ORDER — ACETAMINOPHEN 325 MG PO TABS
650.0000 mg | ORAL_TABLET | Freq: Four times a day (QID) | ORAL | Status: DC | PRN
  Administered 2014-11-13 – 2014-11-14 (×2): 650 mg via ORAL
  Filled 2014-11-13 (×2): qty 2

## 2014-11-13 MED ORDER — LORAZEPAM 2 MG/ML IJ SOLN
0.5000 mg | Freq: Once | INTRAMUSCULAR | Status: AC
  Administered 2014-11-13: 0.5 mg via INTRAVENOUS

## 2014-11-13 MED ORDER — LEVOTHYROXINE SODIUM 25 MCG PO TABS
25.0000 ug | ORAL_TABLET | ORAL | Status: DC
  Administered 2014-11-14 – 2014-11-16 (×3): 25 ug via ORAL
  Filled 2014-11-13 (×4): qty 1

## 2014-11-13 MED ORDER — LISINOPRIL 10 MG PO TABS
10.0000 mg | ORAL_TABLET | Freq: Every morning | ORAL | Status: DC
  Administered 2014-11-14 – 2014-11-16 (×3): 10 mg via ORAL
  Filled 2014-11-13 (×3): qty 1

## 2014-11-13 MED ORDER — SODIUM CHLORIDE 0.9 % IV SOLN: 250.0000 mL | INTRAVENOUS | Status: DC | PRN

## 2014-11-13 MED ORDER — METHYLPREDNISOLONE SODIUM SUCC 125 MG IJ SOLR
125.0000 mg | Freq: Once | INTRAMUSCULAR | Status: AC
  Administered 2014-11-13: 125 mg via INTRAVENOUS
  Filled 2014-11-13: qty 2

## 2014-11-13 MED ORDER — ACETAMINOPHEN 650 MG RE SUPP: 650.0000 mg | Freq: Four times a day (QID) | RECTAL | Status: DC | PRN

## 2014-11-13 MED ORDER — LORAZEPAM 0.5 MG PO TABS: 0.5000 mg | ORAL_TABLET | ORAL | Status: DC | PRN

## 2014-11-13 MED ORDER — INSULIN DETEMIR 100 UNIT/ML ~~LOC~~ SOLN
70.0000 [IU] | Freq: Every day | SUBCUTANEOUS | Status: DC
  Administered 2014-11-13 – 2014-11-15 (×3): 70 [IU] via SUBCUTANEOUS
  Filled 2014-11-13 (×3): qty 0.7

## 2014-11-13 MED ORDER — ALBUTEROL SULFATE (2.5 MG/3ML) 0.083% IN NEBU
2.5000 mg | INHALATION_SOLUTION | RESPIRATORY_TRACT | Status: DC | PRN
  Filled 2014-11-13: qty 3

## 2014-11-13 MED ORDER — BENZONATATE 100 MG PO CAPS
200.0000 mg | ORAL_CAPSULE | Freq: Three times a day (TID) | ORAL | Status: DC | PRN
  Administered 2014-11-14 – 2014-11-15 (×5): 200 mg via ORAL
  Filled 2014-11-13 (×5): qty 2

## 2014-11-13 MED ORDER — ROSUVASTATIN CALCIUM 10 MG PO TABS
10.0000 mg | ORAL_TABLET | Freq: Every day | ORAL | Status: DC
  Administered 2014-11-13 – 2014-11-15 (×3): 10 mg via ORAL
  Filled 2014-11-13 (×3): qty 1

## 2014-11-13 MED ORDER — INSULIN ASPART 100 UNIT/ML ~~LOC~~ SOLN
0.0000 [IU] | Freq: Three times a day (TID) | SUBCUTANEOUS | Status: DC
  Administered 2014-11-13: 5 [IU] via SUBCUTANEOUS
  Administered 2014-11-14: 7 [IU] via SUBCUTANEOUS
  Administered 2014-11-14: 9 [IU] via SUBCUTANEOUS
  Administered 2014-11-14: 5 [IU] via SUBCUTANEOUS
  Administered 2014-11-15: 3 [IU] via SUBCUTANEOUS
  Administered 2014-11-15: 5 [IU] via SUBCUTANEOUS
  Administered 2014-11-15: 9 [IU] via SUBCUTANEOUS

## 2014-11-13 MED ORDER — ALBUTEROL SULFATE (2.5 MG/3ML) 0.083% IN NEBU
5.0000 mg | INHALATION_SOLUTION | RESPIRATORY_TRACT | Status: AC | PRN
  Administered 2014-11-13: 5 mg via RESPIRATORY_TRACT

## 2014-11-13 MED ORDER — LORAZEPAM 2 MG/ML IJ SOLN: 0.5000 mg | Freq: Once | INTRAMUSCULAR | Status: DC

## 2014-11-13 MED ORDER — ASPIRIN EC 325 MG PO TBEC
325.0000 mg | DELAYED_RELEASE_TABLET | Freq: Every day | ORAL | Status: DC
  Administered 2014-11-14 – 2014-11-16 (×3): 325 mg via ORAL
  Filled 2014-11-13 (×3): qty 1

## 2014-11-13 MED ORDER — PREDNISONE 20 MG PO TABS
60.0000 mg | ORAL_TABLET | Freq: Every day | ORAL | Status: DC
  Administered 2014-11-14 – 2014-11-16 (×3): 60 mg via ORAL
  Filled 2014-11-13 (×3): qty 3

## 2014-11-13 MED ORDER — SODIUM CHLORIDE 0.9 % IJ SOLN: 3.0000 mL | INTRAMUSCULAR | Status: DC | PRN

## 2014-11-13 MED ORDER — ONDANSETRON HCL 4 MG/2ML IJ SOLN: 4.0000 mg | Freq: Four times a day (QID) | INTRAMUSCULAR | Status: DC | PRN

## 2014-11-13 NOTE — ED Notes (Signed)
Patient on Bipap, 12/6 40%, VSS, HR 105, RR 20, BP 128/54 O2 stats 100%. Patient calmer, tolerating Bipap well.

## 2014-11-13 NOTE — ED Notes (Signed)
Pt shaking, tachypnea in the 30-40's, shallow breathing, stats low 80's. RT at bedside, patient states she will not be able to tolerate Bipap without something for anxiety. Patient expresses that if it comes down to it she does not want to be "put on a breathing machine", RT present and also heard patients wish. Dr. Lynelle DoctorKnapp informed. Orders for 0.5 mg Ativan received and given. Patient calmer, RR 26, o2 stats 98%.

## 2014-11-13 NOTE — H&P (Signed)
[facility]          History and Physical    PCP:   Lanette Hampshire, MD   Chief Complaint:  SOB, cough  HPI: 78 y/o woman with h/o COPD, OSA non-compliant with CPAP, CAD, HLD, HTN, DM presents with a 1 week history of SOB and cough. She had seen her PCP earlier this week who gave her a cough syrup and an unknown antibiotic. She came today because she felt like she was choking. Cough has been productive of yellow sputum. She also has chest wall pain from coughing. She was so SOB that BiPap was initiated in the ED, however, she quickly became quite anxious with it and required multiple doses of ativan. It is documented that she was wheezing in the ED, however when I examine her, no wheezing is present. We have been asked to admit her for further evaluation and management.  Allergies:   Allergies  Allergen Reactions  . Codeine Itching      Past Medical History  Diagnosis Date  . Arteriosclerotic cardiovascular disease (ASCVD) CARDIOLOGIST-   DR Lattie Haw     CABG surgery in 2001; nl stress nuclear-2007; angiography in 01/2006- TO of LAD; 70% ostial stenosis      of a small ramus; patent grafts; normal EF; cath in 12/2009 essentially unchanged; EF-66%;  Congestive heart failure with preserved LV systolic function  . Hypertension   . Varicose veins   . Hypothyroidism   . Insulin dependent type 2 diabetes mellitus   . Peripheral neuropathy   . Diastolic CHF   . Hyperlipidemia   . History of CVA (cerebrovascular accident)     2013--  LACUNAR INFARCTIONS RIGHT THALAMUS---  residual left side of  lip numb  and left eye vision worse (which corrented lens implant from cataract extraction)  . Lesion of bladder   . Hematuria   . Arthritis   . DDD (degenerative disc disease), lumbar   . OSA (obstructive sleep apnea)     NON-COMPLIANT CPAP  . Chronic obstructive pulmonary disease     Chronic bronchitis  . History of MI (myocardial infarction)     07/ 2001   s/p  cabg  x2  . S/P CABG x 2     07-02-2000  . SUI (stress urinary incontinence, female)   . Lower urinary tract symptoms (LUTS)   . Psoriasis     lower leg  . Thinning of skin     Past Surgical History  Procedure Laterality Date  . Rotator cuff repair Right 1995  . Lumbar spine surgery  x2    yrs ago  . Carpal tunnel release Bilateral yrs ago  . Orif humerus fracture Right 04-18-2008  . Knee arthroscopy Left 10-18-2003    SYNOVECTOMY  . Repair flexor tendon hand Right 09-20-2008    CARPI RADIALIS TENDON TRANSFER TO EXTENSOR TO INDEX, MIDDLE, RING , LITTLE FINGERS AND DIGITI MININI  . Cardiovascular stress test  08-28-2011   DR ROTHBART    NEGATIVE NUCLEAR STUDY/  GLOBAL LVSF/  EF 65%  . Cholecystectomy  1990  . Appendectomy  age 39  . Total abdominal hysterectomy w/ bilateral salpingoophorectomy  age 80  . Cataract extraction w/ intraocular lens  implant, bilateral  2013  . Cardiac catheterization  1991    No sig. cad  . Coronary angioplasty  11-13-1999    balloon angioplasty to mLAD , d2 of LAD  .  Coronary angioplasty with stent placement  01-17-2000    PCI stenting to mLAD & D2 of LAD  . Cardiac catheterization  07-01-2000    high grade in-stent restenosis  . Cardiac catheterization  04-30-2001    single native vessel cad/ graft patent  . Cardiac catheterization  02-13-2006  dr Olevia Perches    2V cad with total occluded LAD  and  70% ostial of small ramus/  grafts patent/  normal ef  . Cardiac catheterization  01-20-2000  dr Angelena Form    essentially unchanged;  severe single vessel cad with diffuse disease throughout CFX and RCA/ ef 66%;  chf with preserved LVSF  . Coronary artery bypass graft  07-02-2000   DR Darylene Price    SVG to diagonal, LIMA to LAD  . Transthoracic echocardiogram  12-12-2013    MILD LVH/  EF 16-10%/  GRADE I DIASTOLIC DYSFUNCTION/  MILD LAE  . Cystoscopy w/ retrogrades Bilateral 08/09/2014    Procedure: CYSTOSCOPY, BILATERAL RETROGRADE, HYDRODISTENSION, BLADDER  BIOPSY WITH FULGERATION, INSTILL PYRIDIUM AND MARCAINE ;  Surgeon: Malka So, MD;  Location: [facility];  Service: Urology;  Laterality: Bilateral;    Prior to Admission medications   Medication Sig Start Date End Date Taking? Authorizing Provider  desipramine (NOPRAMIN) 10 MG tablet Take 10-20 mg by mouth at bedtime.  08/22/14  Yes Historical Provider, MD  levofloxacin (LEVAQUIN) 500 MG tablet Take 500 mg by mouth daily. 7 day course starting on 11/10/2014 11/10/14  Yes Historical Provider, MD  LORazepam (ATIVAN) 0.5 MG tablet Take 0.5 mg by mouth every 4 (four) hours as needed. nerves   Yes Historical Provider, MD  losartan (COZAAR) 50 MG tablet Take 50 mg by mouth daily. 08/31/14  Yes Historical Provider, MD  metFORMIN (GLUCOPHAGE) 500 MG tablet Take 500 mg by mouth 2 (two) times daily with a meal.     Yes Historical Provider, MD  Meth-Hyo-M Bl-Na Phos-Ph Sal (URIBEL) 118 MG CAPS Take 1 capsule by mouth daily.  09/16/14  Yes Historical Provider, MD  oxyCODONE-acetaminophen (PERCOCET) 7.5-325 MG per tablet Take 1 tablet by mouth every 4 (four) hours as needed for pain. For pain 11/09/14  Yes Historical Provider, MD  albuterol (PROVENTIL) (2.5 MG/3ML) 0.083% nebulizer solution Take 3 mLs (2.5 mg total) by nebulization every 6 (six) hours as needed for wheezing or shortness of breath. 09/11/14   Johnna Acosta, MD  aspirin EC 325 MG tablet Take 325 mg by mouth daily.    Historical Provider, MD  azithromycin (ZITHROMAX Z-PAK) 250 MG tablet Take 1 tablet (250 mg total) by mouth daily. $RemoveBefo'500mg'VXMobJlfthO$  PO day 1, then $Remov'250mg'cVSNLu$  PO days 205 Patient not taking: Reported on 11/13/2014 09/11/14   Johnna Acosta, MD  benzonatate (TESSALON) 200 MG capsule Take 1 capsule (200 mg total) by mouth 3 (three) times daily as needed for cough. 09/11/14   Johnna Acosta, MD  HYDROcodone-homatropine [facility]) 5-1.5 MG/5ML syrup Take 5 mLs by mouth every 4 (four) hours as needed. Fir cough 10/15/14   Historical Provider,  MD  insulin detemir (LEVEMIR) 100 UNIT/ML injection Inject 25-70 Units into the skin 2 (two) times daily. 30 units in the morning and 70 units at bedtime    Historical Provider, MD  insulin lispro (HUMALOG) 100 UNIT/ML injection Inject 20-25 Units into the skin 3 (three) times daily.     Historical Provider, MD  levothyroxine (SYNTHROID, LEVOTHROID) 25 MCG tablet Take 25 mcg by mouth every morning.     Historical  Provider, MD  lisinopril (PRINIVIL,ZESTRIL) 10 MG tablet Take 1 tablet by mouth every morning.  11/09/13   Historical Provider, MD  nitroGLYCERIN (NITROSTAT) 0.4 MG SL tablet Place 0.4 mg under the tongue every 5 (five) minutes as needed for chest pain.     Historical Provider, MD  phenazopyridine (PYRIDIUM) 200 MG tablet Take 1 tablet (200 mg total) by mouth 3 (three) times daily as needed for pain. Patient not taking: Reported on 11/13/2014 08/09/14   Malka So, MD  rosuvastatin (CRESTOR) 10 MG tablet Take 10 mg by mouth at bedtime.     Historical Provider, MD  traMADol (ULTRAM) 50 MG tablet Take 50 mg by mouth every 6 (six) hours as needed. For pain 08/22/14   Historical Provider, MD  zolpidem (AMBIEN) 5 MG tablet Take 5 mg by mouth at bedtime. 08/12/14   Historical Provider, MD    Social History:  reports that she has never smoked. She has never used smokeless tobacco. She reports that she does not drink alcohol or use illicit drugs.  Family History  Problem Relation Age of Onset  . Coronary artery disease      Multiple first and second-degree relatives  . Aneurysm      Cerebral circulation  . Hypertension Mother   . Colon cancer Sister   . Clotting disorder      Children diagnosed with hypercoagulable state    Review of Systems:  Constitutional: Denies fever, chills, diaphoresis, appetite change and fatigue.  HEENT: Denies photophobia, eye pain, redness, hearing loss, ear pain, congestion, sore throat, rhinorrhea, sneezing, mouth sores, trouble swallowing, neck pain, neck  stiffness and tinnitus.   Respiratory: Positive for SOB, DOE, cough, chest tightness,  and wheezing.   Cardiovascular: Denies chest pain, palpitations and leg swelling.  Gastrointestinal: Denies nausea, vomiting, abdominal pain, diarrhea, constipation, blood in stool and abdominal distention.  Genitourinary: Denies dysuria, urgency, frequency, hematuria, flank pain and difficulty urinating.  Endocrine: Denies: hot or cold intolerance, sweats, changes in hair or nails, polyuria, polydipsia. Musculoskeletal: Denies myalgias, back pain, joint swelling, arthralgias and gait problem.  Skin: Denies pallor, rash and wound.  Neurological: Denies dizziness, seizures, syncope, weakness, light-headedness, numbness and headaches.  Hematological: Denies adenopathy. Easy bruising, personal or family bleeding history  Psychiatric/Behavioral: Denies suicidal ideation, mood changes, confusion, nervousness, sleep disturbance and agitation   Physical Exam: Blood pressure 145/63, pulse 110, temperature 97.7 F (36.5 C), temperature source Oral, resp. rate 26, height $RemoveBe'5\' 4"'[id number]$  (1.626 m), weight 87.5 kg (192 lb 14.4 oz), SpO2 95 %. Gen: AA Ox3, anxious, no currently SOB, episodic coughing HEENT: [place]/AT/PERRL/EOMI Neck: supple, no JVD, no LAD, no bruits, no goiter CV: RRR, no M/R/G Lungs: good air movement, no wheezing Abdomen: obese/S/NT/ND/+BS Ext: no C/C/E, + pulses Neuro: intact and non-focal  Labs on Admission:  Results for orders placed or performed during the hospital encounter of 11/13/14 (from the past 48 hour(s))  Basic metabolic panel    (if pt has PMH of COPD)     Status: Abnormal   Collection Time: 11/13/14 10:51 AM  Result Value Ref Range   Sodium 137 137 - 147 mEq/L   Potassium 4.1 3.7 - 5.3 mEq/L   Chloride 96 96 - 112 mEq/L   CO2 29 19 - 32 mEq/L   Glucose, Bld 241 (H) 70 - 99 mg/dL   BUN 11 6 - 23 mg/dL   Creatinine, Ser 0.68 0.50 - 1.10 mg/dL   Calcium 9.6 8.4 - 10.5 mg/dL   GFR  calc non  Af Amer 83 (L) >90 mL/min   GFR calc Af Amer >90 >90 mL/min    Comment: (NOTE) The eGFR has been calculated using the CKD EPI equation. This calculation has not been validated in all clinical situations. eGFR's persistently <90 mL/min signify possible Chronic Kidney Disease.    Anion gap 12 5 - 15  CBC     (if pt has PMH of COPD)     Status: Abnormal   Collection Time: 11/13/14 10:51 AM  Result Value Ref Range   WBC 9.6 4.0 - 10.5 K/uL   RBC 5.24 (H) 3.87 - 5.11 MIL/uL   Hemoglobin 12.2 12.0 - 15.0 g/dL   HCT 40.0 36.0 - 46.0 %   MCV 76.3 (L) 78.0 - 100.0 fL   MCH 23.3 (L) 26.0 - 34.0 pg   MCHC 30.5 30.0 - 36.0 g/dL   RDW 17.0 (H) 11.5 - 15.5 %   Platelets 296 150 - 400 K/uL  Pro b natriuretic peptide     Status: None   Collection Time: 11/13/14 10:51 AM  Result Value Ref Range   Pro B Natriuretic peptide (BNP) 88.0 0 - 450 pg/mL  Blood gas, arterial ([facility] & [facility] ONLY)     Status: Abnormal   Collection Time: 11/13/14  1:00 PM  Result Value Ref Range   O2 Content 3.0 L/min   Delivery systems NASAL CANNULA    pH, Arterial 7.389 7.350 - 7.450   pCO2 arterial 40.8 35.0 - 45.0 mmHg   pO2, Arterial 82.0 80.0 - 100.0 mmHg   Bicarbonate 24.1 (H) 20.0 - 24.0 mEq/L   TCO2 21.9 0 - 100 mmol/L   Acid-base deficit 0.2 0.0 - 2.0 mmol/L   O2 Saturation 93.3 %   Patient temperature 37.0    Collection site RIGHT RADIAL    Drawn by [phone]    Sample type ARTERIAL    Allens test (pass/fail) PASS PASS    Radiological Exams on Admission: Dg Chest Portable 1 View  11/13/2014   CLINICAL DATA:  Chronic bronchitis, continued shortness of breath  EXAM: PORTABLE CHEST - 1 VIEW  COMPARISON:  Portable exam 1113 hr compared to 11/10/2014  FINDINGS: Upper normal heart size post CABG.  Mediastinal contours and pulmonary vascularity normal.  Lungs clear.  No pleural effusion or pneumothorax.  Bones unremarkable.  IMPRESSION: No acute abnormalities.   Electronically Signed   By: Lavonia Dana M.D.   On:  11/13/2014 11:40    Assessment/Plan Principal Problem:   Bronchitis Active Problems:   COPD exacerbation   Hypothyroidism   DM type 2 causing complication   Hyperlipidemia   Obesity   Essential hypertension   Sleep apnea    COPD Exacerbation/Bronchitis -Admit to SDU as she required BiPap in the ED. -Nebs, PO steroids, levaquin. -CXR negative for acute abnormalities. -Suspect we will be able to turn her around fairly quickly (she is no longer wheezing and feels much better by the time I am examining her).  Hypothyroidism -Continue synthroid.  DM 2 -A1C. -Continue levemir. -Start SSI.  HTN -Continue home meds.  OSA -Continue qHS CPAP.  DVT Prophylaxis -Lovenox  Code Status -Full code as discussed with patient at bedside in presence of RN.   Time Spent on Admission: 75 minutes  HERNANDEZ ACOSTA,ESTELA [facility] Pager: [phone] 11/13/2014, 4:04 PM

## 2014-11-13 NOTE — ED Provider Notes (Signed)
CSN: [id number]     Arrival date & time 11/13/14  1022 History   First MD Initiated Contact with Patient 11/13/14 1109    This chart was scribed for [name] Reichert, MD by [name] [name], [name] This patient was seen in room IC02/IC02-01 and the patient's care was started at 11:23 AM.  Chief Complaint  Patient presents with  . Shortness of Breath   The history is provided by the patient. No language interpreter was used.   PCP: [name] Reichert, MD HPI Comments: [name] [name] is a 78 y.o. female, with medical Hx noted below and significant for Hx of bronchitis; recurrent SOB; recent Dx of bronchitis exacerbation, who presents to the Emergency Department complaining of persistent SOB onset one week ago. Pt states that she feels like "someone is choking [her] to death." Pt also complains of associated productive cough with a lot of yellow sputum and some streaking of blood today. She reports a lot of wheezing and  using breathing treatments at home during the night and including just PTA with no relief. Pt also complains of moderate dyspnea on exertion. Pt denies fever, chills, sore throat, rhinorrhea, tobacco use, being on 02 at home.  She had posttussive vomiting yesterday without nausea, vomiting, or diarrhea. Patient was seen by her PCP on November 19 and was started on Cipro and a hydrocodone/homatropine cough syrup that she states has not helped.  PCP [name] [name]  Past Medical History  Diagnosis Date  . Arteriosclerotic cardiovascular disease (ASCVD) CARDIOLOGIST-   [name] [name]     CABG surgery in 2001; nl stress nuclear-2007; angiography in 01/2006- TO of LAD; 70% ostial stenosis      of a small ramus; patent grafts; normal EF; cath in 12/2009 essentially unchanged; EF-66%;  Congestive heart failure with preserved LV systolic function  . Hypertension   . Varicose veins   . Hypothyroidism   . Insulin dependent type 2 diabetes mellitus   . Peripheral neuropathy   . Diastolic CHF   .  Hyperlipidemia   . History of CVA (cerebrovascular accident)     2013--  LACUNAR INFARCTIONS RIGHT THALAMUS---  residual left side of  lip numb  and left eye vision worse (which corrented lens implant from cataract extraction)  . Lesion of bladder   . Hematuria   . Arthritis   . DDD (degenerative disc disease), lumbar   . OSA (obstructive sleep apnea)     NON-COMPLIANT CPAP  . Chronic obstructive pulmonary disease     Chronic bronchitis  . History of MI (myocardial infarction)     07/ 2001   s/p  cabg x2  . S/P CABG x 2     07-02-2000  . SUI (stress urinary incontinence, female)   . Lower urinary tract symptoms (LUTS)   . Psoriasis     lower leg  . Thinning of skin    Past Surgical History  Procedure Laterality Date  . Rotator cuff repair Right 1995  . Lumbar spine surgery  x2    yrs ago  . Carpal tunnel release Bilateral yrs ago  . Orif humerus fracture Right 04-18-2008  . Knee arthroscopy Left 10-18-2003    SYNOVECTOMY  . Repair flexor tendon hand Right 09-20-2008    CARPI RADIALIS TENDON TRANSFER TO EXTENSOR TO INDEX, MIDDLE, RING , LITTLE FINGERS AND DIGITI MININI  . Cardiovascular stress test  08-28-2011   [name]    NEGATIVE NUCLEAR STUDY/  GLOBAL LVSF/  EF 65%  .  Cholecystectomy  1990  . Appendectomy  age 34  . Total abdominal hysterectomy w/ bilateral salpingoophorectomy  age 89  . Cataract extraction w/ intraocular lens  implant, bilateral  2013  . Cardiac catheterization  1991    No sig. cad  . Coronary angioplasty  11-13-1999    balloon angioplasty to mLAD , d2 of LAD  . Coronary angioplasty with stent placement  01-17-2000    PCI stenting to mLAD & D2 of LAD  . Cardiac catheterization  07-01-2000    high grade in-stent restenosis  . Cardiac catheterization  04-30-2001    single native vessel cad/ graft patent  . Cardiac catheterization  02-13-2006  [name]    2V cad with total occluded LAD  and  70% ostial of small ramus/  grafts patent/  normal ef  .  Cardiac catheterization  01-20-2000  [name] [name]    essentially unchanged;  severe single vessel cad with diffuse disease throughout CFX and RCA/ ef 66%;  chf with preserved LVSF  . Coronary artery bypass graft  07-02-2000   [name] [name]    SVG to diagonal, LIMA to LAD  . Transthoracic echocardiogram  12-12-2013    MILD LVH/  EF 60-65%/  GRADE I DIASTOLIC DYSFUNCTION/  MILD LAE  . Cystoscopy w/ retrogrades Bilateral 08/09/2014    Procedure: CYSTOSCOPY, BILATERAL RETROGRADE, HYDRODISTENSION, BLADDER BIOPSY WITH FULGERATION, INSTILL PYRIDIUM AND MARCAINE ;  Surgeon: [name] Crete, MD;  Location: [name];  Service: Urology;  Laterality: Bilateral;   Family History  Problem Relation Age of Onset  . Coronary artery disease      Multiple first and second-degree relatives  . Aneurysm      Cerebral circulation  . Hypertension Mother   . Colon cancer Sister   . Clotting disorder      Children diagnosed with hypercoagulable state   History  Substance Use Topics  . Smoking status: Never Smoker   . Smokeless tobacco: Never Used  . Alcohol Use: No   Lives at home No oxygen at home  OB History    No data available     Review of Systems  Constitutional: Positive for diaphoresis. Negative for fever and chills.  HENT: Negative for rhinorrhea and sore throat.   Respiratory: Positive for cough, shortness of breath and wheezing.   Psychiatric/Behavioral: Negative for confusion.  All other systems reviewed and are negative.     Allergies  Codeine  Home Medications   Prior to Admission medications   Medication Sig Start Date End Date Taking? Authorizing Provider  desipramine (NOPRAMIN) 10 MG tablet Take 10-20 mg by mouth at bedtime.  08/22/14  Yes Historical Provider, MD  levofloxacin (LEVAQUIN) 500 MG tablet Take 500 mg by mouth daily. 7 day course starting on 11/10/2014 11/10/14  Yes Historical Provider, MD  LORazepam (ATIVAN) 0.5 MG tablet Take 0.5 mg by mouth  every 4 (four) hours as needed. nerves   Yes Historical Provider, MD  losartan (COZAAR) 50 MG tablet Take 50 mg by mouth daily. 08/31/14  Yes Historical Provider, MD  metFORMIN (GLUCOPHAGE) 500 MG tablet Take 500 mg by mouth 2 (two) times daily with a meal.     Yes Historical Provider, MD  Meth-Hyo-M Bl-Na Phos-Ph Sal (URIBEL) 118 MG CAPS Take 1 capsule by mouth daily.  09/16/14  Yes Historical Provider, MD  oxyCODONE-acetaminophen (PERCOCET) 7.5-325 MG per tablet Take 1 tablet by mouth every 4 (four) hours as needed for pain. For pain 11/09/14  Yes Historical Provider, MD  albuterol (PROVENTIL) (2.5 MG/3ML) 0.083% nebulizer solution Take 3 mLs (2.5 mg total) by nebulization every 6 (six) hours as needed for wheezing or shortness of breath. 09/11/14   Vida RollerBrian D Miller, MD  aspirin EC 325 MG tablet Take 325 mg by mouth daily.    Historical Provider, MD  azithromycin (ZITHROMAX Z-PAK) 250 MG tablet Take 1 tablet (250 mg total) by mouth daily. 500mg  PO day 1, then 250mg  PO days 205 Patient not taking: Reported on 11/13/2014 09/11/14   Vida RollerBrian D Miller, MD  benzonatate (TESSALON) 200 MG capsule Take 1 capsule (200 mg total) by mouth 3 (three) times daily as needed for cough. 09/11/14   Vida RollerBrian D Miller, MD  HYDROcodone-homatropine [facility](HYCODAN) 5-1.5 MG/5ML syrup Take 5 mLs by mouth every 4 (four) hours as needed. Fir cough 10/15/14   Historical Provider, MD  insulin detemir (LEVEMIR) 100 UNIT/ML injection Inject 25-70 Units into the skin 2 (two) times daily. 30 units in the morning and 70 units at bedtime    Historical Provider, MD  insulin lispro (HUMALOG) 100 UNIT/ML injection Inject 20-25 Units into the skin 3 (three) times daily.     Historical Provider, MD  levothyroxine (SYNTHROID, LEVOTHROID) 25 MCG tablet Take 25 mcg by mouth every morning.     Historical Provider, MD  lisinopril (PRINIVIL,ZESTRIL) 10 MG tablet Take 1 tablet by mouth every morning.  11/09/13   Historical Provider, MD  nitroGLYCERIN (NITROSTAT)  0.4 MG SL tablet Place 0.4 mg under the tongue every 5 (five) minutes as needed for chest pain.     Historical Provider, MD  phenazopyridine (PYRIDIUM) 200 MG tablet Take 1 tablet (200 mg total) by mouth 3 (three) times daily as needed for pain. Patient not taking: Reported on 11/13/2014 08/09/14   [name] CreteJohn J Wrenn, MD  rosuvastatin (CRESTOR) 10 MG tablet Take 10 mg by mouth at bedtime.     Historical Provider, MD  traMADol (ULTRAM) 50 MG tablet Take 50 mg by mouth every 6 (six) hours as needed. For pain 08/22/14   Historical Provider, MD  zolpidem (AMBIEN) 5 MG tablet Take 5 mg by mouth at bedtime. 08/12/14   Historical Provider, MD   Triage Vitals: Pulse 105  Temp(Src) 98.2 F (36.8 C) (Oral)  Resp 24  Ht 5\' 4"  (1.626 m)  Wt 190 lb (86.183 kg)  BMI 32.60 kg/m2  SpO2 94% (88% on RA)  Vital signs normal except tachypnea and hypoxia    Physical Exam  Constitutional: She is oriented to person, place, and time. She appears well-developed and well-nourished.  Non-toxic appearance. She does not appear ill. No distress.  HENT:  Head: Normocephalic and atraumatic.  Right Ear: External ear normal.  Left Ear: External ear normal.  Nose: Nose normal. No mucosal edema or rhinorrhea.  Mouth/Throat: Oropharynx is clear and moist and mucous membranes are normal. No dental abscesses or uvula swelling.  Eyes: Conjunctivae and EOM are normal. Pupils are equal, round, and reactive to light.  Neck: Normal range of motion and full passive range of motion without pain. Neck supple.  Cardiovascular: Normal rate, regular rhythm and normal heart sounds.  Exam reveals no gallop and no friction rub.   No murmur heard. Pulmonary/Chest: Accessory muscle usage present. Tachypnea noted. She is in respiratory distress. She has decreased breath sounds. She has wheezes (respiratory audible wheezing). She has no rhonchi. She has no rales. She exhibits no tenderness and no crepitus.  Audible wheezing  Abdominal: Soft.  Normal appearance  and bowel sounds are normal. She exhibits no distension. There is no tenderness. There is no rebound and no guarding.  Musculoskeletal: Normal range of motion. She exhibits no edema or tenderness.  Moves all extremities well.   Neurological: She is alert and oriented to person, place, and time. She has normal strength. No cranial nerve deficit.  Skin: Skin is warm, dry and intact. No rash noted. No erythema. No pallor.  Pt has some psoriatic lesions on her LE  Psychiatric: She has a normal mood and affect. Her speech is normal and behavior is normal. Her mood appears not anxious.  Nursing note and vitals reviewed.   ED Course  Procedures (including critical care time) Medications  albuterol (PROVENTIL, VENTOLIN) (5 MG/ML) 0.5% continuous inhalation solution (not administered)  LORazepam (ATIVAN) injection 0.5 mg (not administered)  ipratropium-albuterol (DUONEB) 0.5-2.5 (3) MG/3ML nebulizer solution 3 mL (3 mLs Nebulization Given 11/13/14 1103)  albuterol (PROVENTIL) (2.5 MG/3ML) 0.083% nebulizer solution (5 mg  Given 11/13/14 1116)  methylPREDNISolone sodium succinate (SOLU-MEDROL) 125 mg/2 mL injection 125 mg (125 mg Intravenous Given 11/13/14 1147)  levofloxacin (LEVAQUIN) IVPB 750 mg (0 mg Intravenous Stopped 11/13/14 1247)  albuterol (PROVENTIL,VENTOLIN) solution continuous neb (15 mg/hr Nebulization Given 11/13/14 1140)  ipratropium (ATROVENT) nebulizer solution 0.5 mg (0.5 mg Nebulization Given 11/13/14 1142)  LORazepam (ATIVAN) injection 0.5 mg (0.5 mg Intravenous Given 11/13/14 1244)    DIAGNOSTIC STUDIES: Oxygen Saturation is 94% on 2L/min  COORDINATION OF CARE: 11:36 AM-Discussed treatment plan which includes meds, imaging, labs, with pt at bedside and pt agreed to plan.   Patient has verbally told her nurse, her respiratory therapist and myself in front of the respiratory therapist that she will not be intubated.  She had received 2 nebulizers at the time  of my exam. She was placed on a continuous nebulizer. She was also given IV steroids and started on IV antibiotics for presumed bronchitis with her normal chest x-ray. Shortly afterwards the nurses started coming to me that patient was having extreme anxiety and shaking. We tried to hold off treating her anxiety due to her respiratory distress. When I went in to examine the patient again she had finished about 3/4 pf her continuous nebulizer she was still having some distress, still having some mild retractions, and still having some diffuse wheezing. We discussed trying BiPAP which would help prevent her needing intubation which however she has refused. When she was started on BiPAP the patient started getting anxious again. She was given Ativan 0.5 mg IV. The respiratory therapist tried to finish her continuous nebulizer during which she started complaining of shaking and anxiety again. She was given another Ativan 0.5 mg IV. At this point it was felt the patient would not be able to go home.  14:17 [name] Ardyth Harps, admit to step down, [name] [name] attending.   Labs Review Results for orders placed or performed during the hospital encounter of 11/13/14  Basic metabolic panel    (if pt has PMH of COPD)  Result Value Ref Range   Sodium 137 137 - 147 mEq/L   Potassium 4.1 3.7 - 5.3 mEq/L   Chloride 96 96 - 112 mEq/L   CO2 29 19 - 32 mEq/L   Glucose, Bld 241 (H) 70 - 99 mg/dL   BUN 11 6 - 23 mg/dL   Creatinine, Ser 1.61 0.50 - 1.10 mg/dL   Calcium 9.6 8.4 - 09.6 mg/dL   GFR calc non Af Amer 83 (L) >90 mL/min  GFR calc Af Amer >90 >90 mL/min   Anion gap 12 5 - 15  CBC     (if pt has PMH of COPD)  Result Value Ref Range   WBC 9.6 4.0 - 10.5 K/uL   RBC 5.24 (H) 3.87 - 5.11 MIL/uL   Hemoglobin 12.2 12.0 - 15.0 g/dL   HCT 04.540.0 40.936.0 - 81.146.0 %   MCV 76.3 (L) 78.0 - 100.0 fL   MCH 23.3 (L) 26.0 - 34.0 pg   MCHC 30.5 30.0 - 36.0 g/dL   RDW 91.417.0 (H) 78.211.5 - 95.615.5 %   Platelets 296 150 - 400 K/uL  Pro b  natriuretic peptide  Result Value Ref Range   Pro B Natriuretic peptide (BNP) 88.0 0 - 450 pg/mL  Blood gas, arterial ([facility] & [facility] ONLY)  Result Value Ref Range   O2 Content 3.0 L/min   Delivery systems NASAL CANNULA    pH, Arterial 7.389 7.350 - 7.450   pCO2 arterial 40.8 35.0 - 45.0 mmHg   pO2, Arterial 82.0 80.0 - 100.0 mmHg   Bicarbonate 24.1 (H) 20.0 - 24.0 mEq/L   TCO2 21.9 0 - 100 mmol/L   Acid-base deficit 0.2 0.0 - 2.0 mmol/L   O2 Saturation 93.3 %   Patient temperature 37.0    Collection site RIGHT RADIAL    Drawn by [phone]    Sample type ARTERIAL    Allens test (pass/fail) PASS PASS   Laboratory interpretation all normal except hyperglycemia, normal blood gas however with her degree of tachypnea her PCO2 should be much lower rather than being normal.    Imaging Review Dg Chest Portable 1 View  11/13/2014   CLINICAL DATA:  Chronic bronchitis, continued shortness of breath  EXAM: PORTABLE CHEST - 1 VIEW  COMPARISON:  Portable exam 1113 hr compared to 11/10/2014  FINDINGS: Upper normal heart size post CABG.  Mediastinal contours and pulmonary vascularity normal.  Lungs clear.  No pleural effusion or pneumothorax.  Bones unremarkable.  IMPRESSION: No acute abnormalities.   Electronically Signed   By: Ulyses SouthwardMark  Boles M.D.   On: 11/13/2014 11:40      Dg Chest 2 View  11/10/2014   CLINICAL DATA:  One week of cough shortness of breath and fever; history of COPD and asthma and previous cardiac surgery  EXAM: CHEST  2 VIEW  COMPARISON:  PA and lateral chest of September 11, 2014  FINDINGS: The lungs are well-expanded. There is no focal infiltrate. There is stable scarring in the left upper lobe. There is no pleural effusion. The cardiac silhouette is top-normal in size. The pulmonary vascularity is normal. The patient has undergone previous CABG. There are 7 intact sternal wires. There is no pleural effusion or pneumothorax. There is no acute bony abnormality.  IMPRESSION: There is no active  cardiopulmonary disease.   Electronically Signed   By: David  SwazilandJordan   On: 11/10/2014 15:59     EKG Interpretation   Date/Time:  Sunday November 13 2014 10:53:49 EST Ventricular Rate:  95 PR Interval:  204 QRS Duration: 106 QT Interval:  368 QTC Calculation: 463 R Axis:   -64 Text Interpretation:  Sinus rhythm Left anterior fascicular block LVH with  secondary repolarization abnormality Anterior infarct, old Baseline wander  in lead(s) II aVF No significant change since last tracing 11 Sep 2014  Confirmed by [facility]  MD-I, Laila Myhre ([phone]) on 11/13/2014 11:36:11 AM      MDM   Final diagnoses:  COPD exacerbation  Hypoxia  Plan admission   CRITICAL CARE Performed by: Devoria Albe L Total critical care time: 40 min Critical care time was exclusive of separately billable procedures and treating other patients. Critical care was necessary to treat or prevent imminent or life-threatening deterioration. Critical care was time spent personally by me on the following activities: development of treatment plan with patient and/or surrogate as well as nursing, discussions with consultants, evaluation of patient's response to treatment, examination of patient, obtaining history from patient or surrogate, ordering and performing treatments and interventions, ordering and review of laboratory studies, ordering and review of radiographic studies, pulse oximetry and re-evaluation of patient's condition.    I personally performed the services described in this documentation, which was scribed in my presence. The recorded information has been reviewed and considered.  Devoria Albe, MD, Armando Gang    Ward Givens, MD 11/13/14 [phone]

## 2014-11-13 NOTE — ED Notes (Addendum)
Pt agitated requesting more Ativan or she "wont keep the mask on". Dr. Lynelle DoctorKnapp informed. Ativan 0.5 mg IV given.

## 2014-11-13 NOTE — ED Notes (Signed)
[facility] [place]. All questions answered.

## 2014-11-13 NOTE — ED Notes (Signed)
Patient's visitor sitting in hallway in front of room 15, able to see patients in room 15 and 16. Explained to patients visitor that she could not sit in the hallway due to patient privacy laws, visitor angry/ expressed disaccorded that she had to move, however she did returned to sitting in room 17,

## 2014-11-13 NOTE — ED Notes (Signed)
Pt anxious, visible shaking, tugging at neb mask, patient staes she "cant take anymore of this", referring to the neb treatment. Knapp informed. Removed neb treatment and patient placed on 2l/[place]

## 2014-11-13 NOTE — ED Notes (Signed)
Pt reports history of chronic bronchitis and was diagnosed with a bronchitis exacerbation on thursday. Pt reports continued sob. Pt reports using breathing treatments at home with no relief. moderate dyspnea noted with exertion and at rest.

## 2014-11-14 LAB — GLUCOSE, CAPILLARY
GLUCOSE-CAPILLARY: 356 mg/dL — AB (ref 70–99)
GLUCOSE-CAPILLARY: 330 mg/dL — AB (ref 70–99)
Glucose-Capillary: 278 mg/dL — ABNORMAL HIGH (ref 70–99)
Glucose-Capillary: 411 mg/dL — ABNORMAL HIGH (ref 70–99)

## 2014-11-14 LAB — CBC
HCT: 37.2 % (ref 36.0–46.0)
Hemoglobin: 11.7 g/dL — ABNORMAL LOW (ref 12.0–15.0)
MCH: 23.7 pg — AB (ref 26.0–34.0)
MCHC: 31.5 g/dL (ref 30.0–36.0)
MCV: 75.3 fL — AB (ref 78.0–100.0)
PLATELETS: 282 10*3/uL (ref 150–400)
RBC: 4.94 MIL/uL (ref 3.87–5.11)
RDW: 16.9 % — ABNORMAL HIGH (ref 11.5–15.5)
WBC: 12.6 10*3/uL — ABNORMAL HIGH (ref 4.0–10.5)

## 2014-11-14 LAB — BASIC METABOLIC PANEL
Anion gap: 12 (ref 5–15)
BUN: 12 mg/dL (ref 6–23)
CALCIUM: 9.8 mg/dL (ref 8.4–10.5)
CO2: 27 mEq/L (ref 19–32)
CREATININE: 0.66 mg/dL (ref 0.50–1.10)
Chloride: 98 mEq/L (ref 96–112)
GFR, EST NON AFRICAN AMERICAN: 84 mL/min — AB (ref >90)
GLUCOSE: 308 mg/dL — AB (ref 70–99)
POTASSIUM: 4.6 meq/L (ref 3.7–5.3)
Sodium: 137 mEq/L (ref 137–147)

## 2014-11-14 LAB — HEMOGLOBIN A1C
Hgb A1c MFr Bld: 8.5 % — ABNORMAL HIGH (ref <5.7)
Mean Plasma Glucose: 197 mg/dL — ABNORMAL HIGH (ref <117)

## 2014-11-14 LAB — GLUCOSE, RANDOM: Glucose, Bld: 380 mg/dL — ABNORMAL HIGH (ref 70–99)

## 2014-11-14 MED ORDER — OXYCODONE HCL 5 MG PO TABS
5.0000 mg | ORAL_TABLET | ORAL | Status: DC | PRN
  Administered 2014-11-15 – 2014-11-16 (×6): 5 mg via ORAL
  Filled 2014-11-14 (×6): qty 1

## 2014-11-14 NOTE — Progress Notes (Signed)
Patient attempted to wear CPAP for a short time.  She did well but claims she can't fall asleep; appears more of anxiety issue may benefit from oxygen at night if needed.

## 2014-11-14 NOTE — Progress Notes (Signed)
Subjective: The patient is fairly comfortable this morning with less shortness of breath. He is seen emergency room with the history of shortness of breath and cough for week. She felt like she was choking. BiPAP was initiated but this was discontinued. She became quite anxious and was given Ativan.  Chest x-ray was negative  Objective: Vital signs in last 24 hours: Temp:  [97.5 F (36.4 C)-98.2 F (36.8 C)] 97.9 F (36.6 C) (11/23 0400) Pulse Rate:  [70-115] 96 (11/23 0500) Resp:  [16-34] 34 (11/23 0500) BP: (97-165)/(45-102) 132/59 mmHg (11/23 0500) SpO2:  [87 %-99 %] 94 % (11/23 0500) FiO2 (%):  [2 %] 2 % (11/22 1048) Weight:  [86.183 kg (190 lb)-87.5 kg (192 lb 14.4 oz)] 87 kg (191 lb 12.8 oz) (11/23 0400) Weight change:  Last BM Date: 11/11/14  Intake/Output from previous day: 11/22 0701 - 11/23 0700 In: 483 [P.O.:480; I.V.:3] Out: 6 [Urine:6] Intake/Output this shift: Total I/O In: 3 [I.V.:3] Out: 6 [Urine:6]  Physical Exam: Gen. appearance patient is alert and oriented  HEENT negative  Neck supple no JVD or thyroid abnormalities  Lungs clear to P&A  Abdomen no palpable organs or masses  Extremities free of edema     Recent Labs  11/13/14 1051 11/14/14 0553  WBC 9.6 12.6*  HGB 12.2 11.7*  HCT 40.0 37.2  PLT 296 282   BMET  Recent Labs  11/13/14 1051 11/14/14 0553  NA 137 137  K 4.1 4.6  CL 96 98  CO2 29 27  GLUCOSE 241* 308*  BUN 11 12  CREATININE 0.68 0.66  CALCIUM 9.6 9.8    Studies/Results: Dg Chest Portable 1 View  11/13/2014   CLINICAL DATA:  Chronic bronchitis, continued shortness of breath  EXAM: PORTABLE CHEST - 1 VIEW  COMPARISON:  Portable exam 1113 hr compared to 11/10/2014  FINDINGS: Upper normal heart size post CABG.  Mediastinal contours and pulmonary vascularity normal.  Lungs clear.  No pleural effusion or pneumothorax.  Bones unremarkable.  IMPRESSION: No acute abnormalities.   Electronically Signed   By: Ulyses SouthwardMark  Boles M.D.    On: 11/13/2014 11:40    Medications:  . aspirin EC  325 mg Oral Daily  . enoxaparin (LOVENOX) injection  40 mg Subcutaneous Q24H  . insulin aspart  0-9 Units Subcutaneous TID WC  . insulin aspart  3 Units Subcutaneous TID WC  . insulin detemir  30 Units Subcutaneous Daily   And  . insulin detemir  70 Units Subcutaneous QHS  . ipratropium-albuterol  3 mL Nebulization Q6H  . levofloxacin  750 mg Oral Daily  . levothyroxine  25 mcg Oral BH-q7a  . lisinopril  10 mg Oral q morning - 10a  . losartan  50 mg Oral Daily  . predniSONE  60 mg Oral Q breakfast  . rosuvastatin  10 mg Oral QHS  . sodium chloride  3 mL Intravenous Q12H  . zolpidem  5 mg Oral QHS        Assessment/Plan:  1. Bronchitis-COPD-acute on chronic respiratory failure-plan to continue Levaquin 750 mg-neb treatments and steroids  2 hypothyroidism-continue Synthroid  3. Diabetes mellitus-continue to monitor blood sugars insulin ASP ART   LOS: 1 day   Alyxis Grippi G 11/14/2014, 6:48 AM

## 2014-11-14 NOTE — Progress Notes (Signed)
Pt a/O.vss. Saline lock patent. O2 on 2L/[place]. Report given to [place] Lake[place]renna, Charity fundraiserN. Caregiver verbalized understanding of report. Pt to be transferred to room 320 via wheelchair.

## 2014-11-14 NOTE — Care Management Note (Signed)
UR completed 

## 2014-11-14 NOTE — Care Management Note (Addendum)
    Page 1 of 1   11/16/2014     11:01:04 AM CARE MANAGEMENT NOTE 11/16/2014  Patient:  [name] [name],[name] [name]   Account Number:  [id number]  Date Initiated:  11/14/2014  Documentation initiated by:  Kathyrn SheriffHILDRESS,JESSICA  Subjective/Objective Assessment:   Pt is from home with self care. Pt has a cane and walker at home but doesnt use them. Pt has no HH services and struggles to afford insulin.     Action/Plan:   Pt plans to discharge home with self care. Will continue to follow for CM needs.   Anticipated DC Date:  11/16/2014   Anticipated DC Plan:  HOME/SELF CARE      DC Planning Services  CM consult      Choice offered to / List presented to:             Status of service:  Completed, signed off Medicare Important Message given?  YES (If response is "NO", the following Medicare IM given date fields will be blank) Date Medicare IM given:  11/16/2014 Medicare IM given by:  Sharrie RothmanBLACKWELL,Jayonna Meyering C Date Additional Medicare IM given:   Additional Medicare IM given by:    Discharge Disposition:  HOME/SELF CARE  Per UR Regulation:    If discussed at Long Length of Stay Meetings, dates discussed:    Comments:  11/16/14 1100 Arlyss Queenammy Jru Pense, RN BSN CM Pt to be discharged home today. No CM needs noted. Pt does not qualify for home O2 at this time.  11/14/2014 1400 Kathyrn SheriffJessica Childress, RN, MSN, [facility]

## 2014-11-14 NOTE — Progress Notes (Signed)
    Nutrition Brief Note  Patient identified on the Malnutrition Screening Tool (MST) Report. Pt can hardly provide diet hx due to continuous coughing.  Wt Readings from Last 15 Encounters:  11/14/14 191 lb 12.8 oz (87 kg)  09/11/14 205 lb (92.987 kg)  08/31/14 206 lb 8 oz (93.668 kg)  08/09/14 208 lb (94.348 kg)  12/10/13 210 lb 1.6 oz (95.3 kg)  12/09/13 210 lb (95.255 kg)  03/11/13 190 lb (86.183 kg)  03/02/13 190 lb (86.183 kg)  01/11/13 210 lb (95.255 kg)  07/18/12 220 lb 12.8 oz (100.154 kg)  06/22/12 213 lb 6.4 oz (96.798 kg)  02/02/12 212 lb 14.4 oz (96.571 kg)  01/07/12 210 lb (95.255 kg)  08/30/11 223 lb 15.8 oz (101.6 kg)  08/23/10 221 lb (100.245 kg)    Body mass index is 32.91 kg/(m^2). Patient meets criteria for obesity class I based on current BMI. Pt has 6% wt loss over past 60 days which is trending toward significant. She reports decreased appetite and oral intake related to breathing difficulty. She adamantly refuses oral supplements. Encouraged her to inform dietary staff of food preferences in order to maximize meal intake. Current diet order is Heart Healthy/CHO Modified. Labs and medications reviewed.   Royann ShiversLynn Quintrell Baze MS,RD,CSG,LDN Office: [phone] Pager: [phone]

## 2014-11-15 LAB — GLUCOSE, CAPILLARY
GLUCOSE-CAPILLARY: 444 mg/dL — AB (ref 70–99)
Glucose-Capillary: 329 mg/dL — ABNORMAL HIGH (ref 70–99)
Glucose-Capillary: 287 mg/dL — ABNORMAL HIGH (ref 70–99)
Glucose-Capillary: 286 mg/dL — ABNORMAL HIGH (ref 70–99)
Glucose-Capillary: 240 mg/dL — ABNORMAL HIGH (ref 70–99)
Glucose-Capillary: 371 mg/dL — ABNORMAL HIGH (ref 70–99)

## 2014-11-15 MED ORDER — INSULIN ASPART 100 UNIT/ML ~~LOC~~ SOLN: 0.0000 [IU] | Freq: Every day | SUBCUTANEOUS | Status: DC

## 2014-11-15 MED ORDER — HYDROCORTISONE 2.5 % RE CREA
TOPICAL_CREAM | Freq: Three times a day (TID) | RECTAL | Status: DC
  Administered 2014-11-15 – 2014-11-16 (×3): via TOPICAL
  Filled 2014-11-15: qty 28.35

## 2014-11-15 MED ORDER — INSULIN ASPART 100 UNIT/ML ~~LOC~~ SOLN
0.0000 [IU] | Freq: Three times a day (TID) | SUBCUTANEOUS | Status: DC
  Administered 2014-11-16: 7 [IU] via SUBCUTANEOUS

## 2014-11-15 MED ORDER — INSULIN ASPART 100 UNIT/ML ~~LOC~~ SOLN
5.0000 [IU] | Freq: Once | SUBCUTANEOUS | Status: AC
  Administered 2014-11-15: 5 [IU] via SUBCUTANEOUS

## 2014-11-15 NOTE — Progress Notes (Signed)
Subjective: The patient is alert and comfortable this morning she states she had some difficulty with breathing through the night. She was admitted with shortness of breath and cough for week. Cough is productive. She apparently has slight decrease in O2 sat last evening. Her blood sugars have been high in 2-300 range  Objective: Vital signs in last 24 hours: Temp:  [97.4 F (36.3 C)-98.3 F (36.8 C)] 98.3 F (36.8 C) (11/24 0602) Pulse Rate:  [76-106] 76 (11/24 0602) Resp:  [15-26] 22 (11/24 0602) BP: (101-158)/(54-72) 142/54 mmHg (11/24 0602) SpO2:  [93 %-99 %] 96 % (11/24 0602) Weight change:  Last BM Date: 11/14/14  Intake/Output from previous day: 11/23 0701 - 11/24 0700 In: 480 [P.O.:480] Out: -  Intake/Output this shift:    Physical Exam: Gen. appearance patient is alert and oriented  H EENT negative  Neck supple no JVD or thyroid abnormalities  Lungs clear to P&A  Heart regular rhythm  Abdomen the palpable organs or masses  Extremities free of edema   Recent Labs  11/13/14 1051 11/14/14 0553  WBC 9.6 12.6*  HGB 12.2 11.7*  HCT 40.0 37.2  PLT 296 282   BMET  Recent Labs  11/13/14 1051 11/14/14 0553 11/14/14 2053  NA 137 137  --   K 4.1 4.6  --   CL 96 98  --   CO2 29 27  --   GLUCOSE 241* 308* 380*  BUN 11 12  --   CREATININE 0.68 0.66  --   CALCIUM 9.6 9.8  --     Studies/Results: Dg Chest Portable 1 View  11/13/2014   CLINICAL DATA:  Chronic bronchitis, continued shortness of breath  EXAM: PORTABLE CHEST - 1 VIEW  COMPARISON:  Portable exam 1113 hr compared to 11/10/2014  FINDINGS: Upper normal heart size post CABG.  Mediastinal contours and pulmonary vascularity normal.  Lungs clear.  No pleural effusion or pneumothorax.  Bones unremarkable.  IMPRESSION: No acute abnormalities.   Electronically Signed   By: Ulyses SouthwardMark  Boles M.D.   On: 11/13/2014 11:40    Medications:  . aspirin EC  325 mg Oral Daily  . enoxaparin (LOVENOX) injection  40 mg  Subcutaneous Q24H  . insulin aspart  0-9 Units Subcutaneous TID WC  . insulin aspart  3 Units Subcutaneous TID WC  . insulin detemir  30 Units Subcutaneous Daily   And  . insulin detemir  70 Units Subcutaneous QHS  . ipratropium-albuterol  3 mL Nebulization Q6H  . levofloxacin  750 mg Oral Daily  . levothyroxine  25 mcg Oral BH-q7a  . lisinopril  10 mg Oral q morning - 10a  . losartan  50 mg Oral Daily  . predniSONE  60 mg Oral Q breakfast  . rosuvastatin  10 mg Oral QHS  . sodium chloride  3 mL Intravenous Q12H  . zolpidem  5 mg Oral QHS        Assessment/Plan: 1. Bronchitis-acute on chronic respiratory failure-plan to continue neb treatments Levaquin 750 mg daily and steroids  2. Hypothyroidism-continue Synthroid dose  Diabetes mellitus-continue to monitor sugars continue insulin as ordered   LOS: 2 days   Mashal Slavick G 11/15/2014, 6:18 AM

## 2014-11-15 NOTE — Progress Notes (Addendum)
Inpatient Diabetes Program Recommendations  AACE/ADA: New Consensus Statement on Inpatient Glycemic Control (2013)  Target Ranges:  Prepandial:   less than 140 mg/dL      Peak postprandial:   less than 180 mg/dL (1-2 hours)      Critically ill patients:  140 - 180 mg/dL   Results for [name] [name], [name] [name] (MRN [id number]) as of 11/15/2014 09:05  Ref. Range 11/14/2014 07:19 11/14/2014 11:26 11/14/2014 17:47 11/14/2014 20:01 11/15/2014 01:14 11/15/2014 05:58 11/15/2014 07:18  Glucose-Capillary Latest Range: 70-99 mg/dL 782278 (H) 956356 (H) 213330 (H) 411 (H) 329 (H) 287 (H) 286 (H)   Diabetes history: DM2 Outpatient Diabetes medications: Levemir 30 units QAM, Levemir 70 units QHS, Humalog 20-25 units TID with meals Current orders for Inpatient glycemic control:  Levemir 30 units QAM, Levemir 70 units QHS, Novolog 0-9 units AC, Novolog 3 units TID with meals  Inpatient Diabetes Program Recommendations Insulin - Meal Coverage: Please consider increasing meal coverage to Novolog 15 units TID with meals.  Insulin-Correction: Please consider increasing Novolog correction to moderate scale and adding Novolog bedtime correction scale.  Thanks, Orlando PennerMarie Shaunice Levitan, RN, MSN, CCRN, CDE Diabetes Coordinator Inpatient Diabetes Program [phone] (Team Pager) [phone] ([facility] office) [phone] [facility] office)

## 2014-11-16 LAB — GLUCOSE, CAPILLARY
Glucose-Capillary: 202 mg/dL — ABNORMAL HIGH (ref 70–99)
Glucose-Capillary: 307 mg/dL — ABNORMAL HIGH (ref 70–99)

## 2014-11-16 LAB — CULTURE, RESPIRATORY

## 2014-11-16 LAB — CULTURE, RESPIRATORY W GRAM STAIN: Culture: NORMAL

## 2014-11-16 MED ORDER — INSULIN DETEMIR 100 UNIT/ML ~~LOC~~ SOLN
35.0000 [IU] | Freq: Every day | SUBCUTANEOUS | Status: DC
  Administered 2014-11-16: 35 [IU] via SUBCUTANEOUS
  Filled 2014-11-16 (×3): qty 0.35

## 2014-11-16 MED ORDER — INSULIN DETEMIR 100 UNIT/ML ~~LOC~~ SOLN
70.0000 [IU] | Freq: Every day | SUBCUTANEOUS | Status: DC
  Filled 2014-11-16 (×2): qty 0.7

## 2014-11-16 MED ORDER — INSULIN ASPART 100 UNIT/ML ~~LOC~~ SOLN
15.0000 [IU] | Freq: Three times a day (TID) | SUBCUTANEOUS | Status: DC
  Administered 2014-11-16: 15 [IU] via SUBCUTANEOUS

## 2014-11-16 NOTE — Discharge Summary (Signed)
Physician Discharge Summary  [name] [name] Mac NWG:[id number] DOB: 20-Apr-1936 DOA: 11/13/2014  PCP: Alice Reichert, MD  Admit date: 11/13/2014 Discharge date: 11/16/2014     Discharge Diagnoses:  1. Bronchitis-acute on chronic respiratory failure 2. Diabetes mellitus insulin-dependent 3. Essential hypertension 4. Hypothyroidism 5.  6.  7.   Discharge Condition: Stable Disposition: Home  Diet recommendation: 2000 ^ ADA diet  Filed Weights   11/13/14 1047 11/13/14 1522 11/14/14 0400  Weight: 86.183 kg (190 lb) 87.5 kg (192 lb 14.4 oz) 87 kg (191 lb 12.8 oz)    History of present illness:  The patient was admitted through the ED with a history of shortness of breath and cough which is been present for approximately 1 week. She becomes quite anxious and required several doses of Ativan in ED. She had shortness of breath BiPAP was initiated, and this was discontinued because of her agitation. She was started on IV antibiotics nebulizer treatments and was subsequently admitted  Hospital Course: Positive physical findings on admission-rhonchi noted in lower lung fields. The patient was continued on intravenous fluids ,, nebulizer treatments and IV antibiotics Levaquin 750 mg daily. She progressively improved. She was also continued on steroids. Throughout her hospital stay her blood sugars were quite high ranging from 240 -444. She was continued on basal insulin Levemir 70 units each a.m. and 35 units each p.m. Also was given NovoLog by sliding scale. X-ray of chest was negative. The patient was continued on medications listed below. She had improved by the third hospital day and it was felt she could be discharged home.    Discharge Instructions The patient will be continued on medications listed below. She will also be on Levaquin 750 mg daily Medrol dose pack   Medication List    STOP taking these medications        benzonatate 200 MG capsule  Commonly known as:  TESSALON     oxyCODONE-acetaminophen 7.5-325 MG per tablet  Commonly known as:  PERCOCET     traMADol 50 MG tablet  Commonly known as:  ULTRAM     URIBEL 118 MG Caps      TAKE these medications        albuterol (2.5 MG/3ML) 0.083% nebulizer solution  Commonly known as:  PROVENTIL  Take 3 mLs (2.5 mg total) by nebulization every 6 (six) hours as needed for wheezing or shortness of breath.     aspirin EC 325 MG tablet  Take 325 mg by mouth daily.     desipramine 10 MG tablet  Commonly known as:  NOPRAMIN  Take 10-20 mg by mouth at bedtime.     insulin lispro 100 UNIT/ML injection  Commonly known as:  HUMALOG  Inject 20-25 Units into the skin 3 (three) times daily.     LEVEMIR 100 UNIT/ML injection  Generic drug:  insulin detemir  Inject 25-70 Units into the skin 2 (two) times daily. 30 units in the morning and 70 units at bedtime     levofloxacin 500 MG tablet  Commonly known as:  LEVAQUIN  Take 500 mg by mouth daily. 7 day course starting on 11/10/2014     levothyroxine 25 MCG tablet  Commonly known as:  SYNTHROID, LEVOTHROID  Take 25 mcg by mouth every morning.     lisinopril 10 MG tablet  Commonly known as:  PRINIVIL,ZESTRIL  Take 1 tablet by mouth every morning.     LORazepam 0.5 MG tablet  Commonly known as:  ATIVAN  Take 0.5 mg  by mouth every 4 (four) hours as needed. nerves     losartan 50 MG tablet  Commonly known as:  COZAAR  Take 50 mg by mouth daily.     metFORMIN 500 MG tablet  Commonly known as:  GLUCOPHAGE  Take 500 mg by mouth 2 (two) times daily with a meal.     nitroGLYCERIN 0.4 MG SL tablet  Commonly known as:  NITROSTAT  Place 0.4 mg under the tongue every 5 (five) minutes as needed for chest pain.     rosuvastatin 10 MG tablet  Commonly known as:  CRESTOR  Take 10 mg by mouth at bedtime.     zolpidem 5 MG tablet  Commonly known as:  AMBIEN  Take 5 mg by mouth at bedtime.       Allergies  Allergen Reactions  . Codeine Itching    The results  of significant diagnostics from this hospitalization (including imaging, microbiology, ancillary and laboratory) are listed below for reference.    Significant Diagnostic Studies: Dg Chest 2 View  11/10/2014   CLINICAL DATA:  One week of cough shortness of breath and fever; history of COPD and asthma and previous cardiac surgery  EXAM: CHEST  2 VIEW  COMPARISON:  PA and lateral chest of September 11, 2014  FINDINGS: The lungs are well-expanded. There is no focal infiltrate. There is stable scarring in the left upper lobe. There is no pleural effusion. The cardiac silhouette is top-normal in size. The pulmonary vascularity is normal. The patient has undergone previous CABG. There are 7 intact sternal wires. There is no pleural effusion or pneumothorax. There is no acute bony abnormality.  IMPRESSION: There is no active cardiopulmonary disease.   Electronically Signed   By: David  SwazilandJordan   On: 11/10/2014 15:59   Dg Chest Portable 1 View  11/13/2014   CLINICAL DATA:  Chronic bronchitis, continued shortness of breath  EXAM: PORTABLE CHEST - 1 VIEW  COMPARISON:  Portable exam 1113 hr compared to 11/10/2014  FINDINGS: Upper normal heart size post CABG.  Mediastinal contours and pulmonary vascularity normal.  Lungs clear.  No pleural effusion or pneumothorax.  Bones unremarkable.  IMPRESSION: No acute abnormalities.   Electronically Signed   By: Ulyses SouthwardMark  Boles M.D.   On: 11/13/2014 11:40    Microbiology: Recent Results (from the past 240 hour(s))  MRSA PCR Screening     Status: None   Collection Time: 11/13/14  4:10 PM  Result Value Ref Range Status   MRSA by PCR NEGATIVE NEGATIVE Final    Comment:        The GeneXpert MRSA Assay (FDA approved for NASAL specimens only), is one component of a comprehensive MRSA colonization surveillance program. It is not intended to diagnose MRSA infection nor to guide or monitor treatment for MRSA infections.   Culture, respiratory (NON-Expectorated)     Status:  None   Collection Time: 11/14/14 10:50 AM  Result Value Ref Range Status   Specimen Description SPUTUM EXPECTORATED  Final   Special Requests NONE  Final   Gram Stain   Final    FEW WBC PRESENT,BOTH PMN AND MONONUCLEAR NO SQUAMOUS EPITHELIAL CELLS SEEN NO ORGANISMS SEEN [facility]formed at [facility]    Culture   Final    NORMAL OROPHARYNGEAL FLORA Performed at [facility]    Report Status 11/16/2014 FINAL  Final     Labs: Basic Metabolic Panel:  Recent Labs Lab 11/13/14 1051 11/14/14 0553 11/14/14 2053  NA 137 137  --  K 4.1 4.6  --   CL 96 98  --   CO2 29 27  --   GLUCOSE 241* 308* 380*  BUN 11 12  --   CREATININE 0.68 0.66  --   CALCIUM 9.6 9.8  --    Liver Function Tests: No results for input(s): AST, ALT, ALKPHOS, BILITOT, PROT, ALBUMIN in the last 168 hours. No results for input(s): LIPASE, AMYLASE in the last 168 hours. No results for input(s): AMMONIA in the last 168 hours. CBC:  Recent Labs Lab 11/13/14 1051 11/14/14 0553  WBC 9.6 12.6*  HGB 12.2 11.7*  HCT 40.0 37.2  MCV 76.3* 75.3*  PLT 296 282   Cardiac Enzymes: No results for input(s): CKTOTAL, CKMB, CKMBINDEX, TROPONINI in the last 168 hours. BNP: BNP (last 3 results)  Recent Labs  08/24/14 1541 08/29/14 0732 11/13/14 1051  PROBNP 101.4 399.8 88.0   CBG:  Recent Labs Lab 11/15/14 0718 11/15/14 1131 11/15/14 1629 11/15/14 2042 11/16/14 0736  GLUCAP 286* 240* 371* 444* 202*    Principal Problem:   Bronchitis Active Problems:   Hypothyroidism   DM type 2 causing complication   Hyperlipidemia   Obesity   Essential hypertension   Sleep apnea   COPD exacerbation   Time coordinating discharge: 45 minutes Signed:  Butch PennyAngus Flo Berroa, MD 11/16/2014, 12:47 PM

## 2014-12-29 ENCOUNTER — Other Ambulatory Visit (HOSPITAL_COMMUNITY): Payer: Self-pay | Admitting: Family Medicine

## 2014-12-29 DIAGNOSIS — M79605 Pain in left leg: Principal | ICD-10-CM

## 2014-12-29 DIAGNOSIS — M79604 Pain in right leg: Secondary | ICD-10-CM

## 2014-12-30 ENCOUNTER — Other Ambulatory Visit (HOSPITAL_COMMUNITY): Payer: Self-pay | Admitting: Family Medicine

## 2014-12-30 ENCOUNTER — Ambulatory Visit (HOSPITAL_COMMUNITY)
Admission: RE | Admit: 2014-12-30 | Discharge: 2014-12-30 | Disposition: A | Discharge status: Home / Self Care | Payer: Medicare HMO | Source: Ambulatory Visit | Attending: Family Medicine | Admitting: Family Medicine

## 2014-12-30 DIAGNOSIS — M79604 Pain in right leg: Secondary | ICD-10-CM

## 2014-12-30 DIAGNOSIS — E119 Type 2 diabetes mellitus without complications: Secondary | ICD-10-CM | POA: Insufficient documentation

## 2014-12-30 DIAGNOSIS — M79661 Pain in right lower leg: Secondary | ICD-10-CM | POA: Diagnosis not present

## 2014-12-30 DIAGNOSIS — M79662 Pain in left lower leg: Secondary | ICD-10-CM | POA: Diagnosis not present

## 2014-12-30 DIAGNOSIS — I739 Peripheral vascular disease, unspecified: Secondary | ICD-10-CM | POA: Insufficient documentation

## 2014-12-30 DIAGNOSIS — I10 Essential (primary) hypertension: Secondary | ICD-10-CM | POA: Insufficient documentation

## 2014-12-30 DIAGNOSIS — Z1231 Encounter for screening mammogram for malignant neoplasm of breast: Secondary | ICD-10-CM

## 2014-12-30 DIAGNOSIS — M79605 Pain in left leg: Secondary | ICD-10-CM

## 2015-01-05 ENCOUNTER — Ambulatory Visit (HOSPITAL_COMMUNITY)
Admission: RE | Admit: 2015-01-05 | Discharge: 2015-01-05 | Disposition: A | Discharge status: Home / Self Care | Payer: Medicare HMO | Source: Ambulatory Visit | Attending: Family Medicine | Admitting: Family Medicine

## 2015-01-05 DIAGNOSIS — Z1231 Encounter for screening mammogram for malignant neoplasm of breast: Secondary | ICD-10-CM | POA: Insufficient documentation

## 2015-02-07 ENCOUNTER — Ambulatory Visit (HOSPITAL_COMMUNITY)
Admission: RE | Admit: 2015-02-07 | Discharge: 2015-02-07 | Disposition: A | Discharge status: Home / Self Care | Payer: Medicare HMO | Source: Ambulatory Visit | Attending: Family Medicine | Admitting: Family Medicine

## 2015-02-07 ENCOUNTER — Other Ambulatory Visit (HOSPITAL_COMMUNITY): Payer: Self-pay | Admitting: Family Medicine

## 2015-02-07 DIAGNOSIS — W19XXXA Unspecified fall, initial encounter: Secondary | ICD-10-CM | POA: Diagnosis not present

## 2015-02-07 DIAGNOSIS — S2242XA Multiple fractures of ribs, left side, initial encounter for closed fracture: Secondary | ICD-10-CM | POA: Diagnosis not present

## 2015-02-07 DIAGNOSIS — R0602 Shortness of breath: Secondary | ICD-10-CM | POA: Diagnosis not present

## 2015-02-07 DIAGNOSIS — R0781 Pleurodynia: Secondary | ICD-10-CM | POA: Diagnosis present

## 2015-02-07 DIAGNOSIS — S20212A Contusion of left front wall of thorax, initial encounter: Secondary | ICD-10-CM | POA: Insufficient documentation

## 2015-03-07 IMAGING — CR DG LUMBAR SPINE COMPLETE 4+V
5 series · 5 of 5 positions shown · non-contrast
Comparison: 10/06/2012

CLINICAL DATA: Fall, lumbar back and hip pain

LUMBAR SPINE - COMPLETE 4+ VIEW

[view not recorded (1 of 5)]
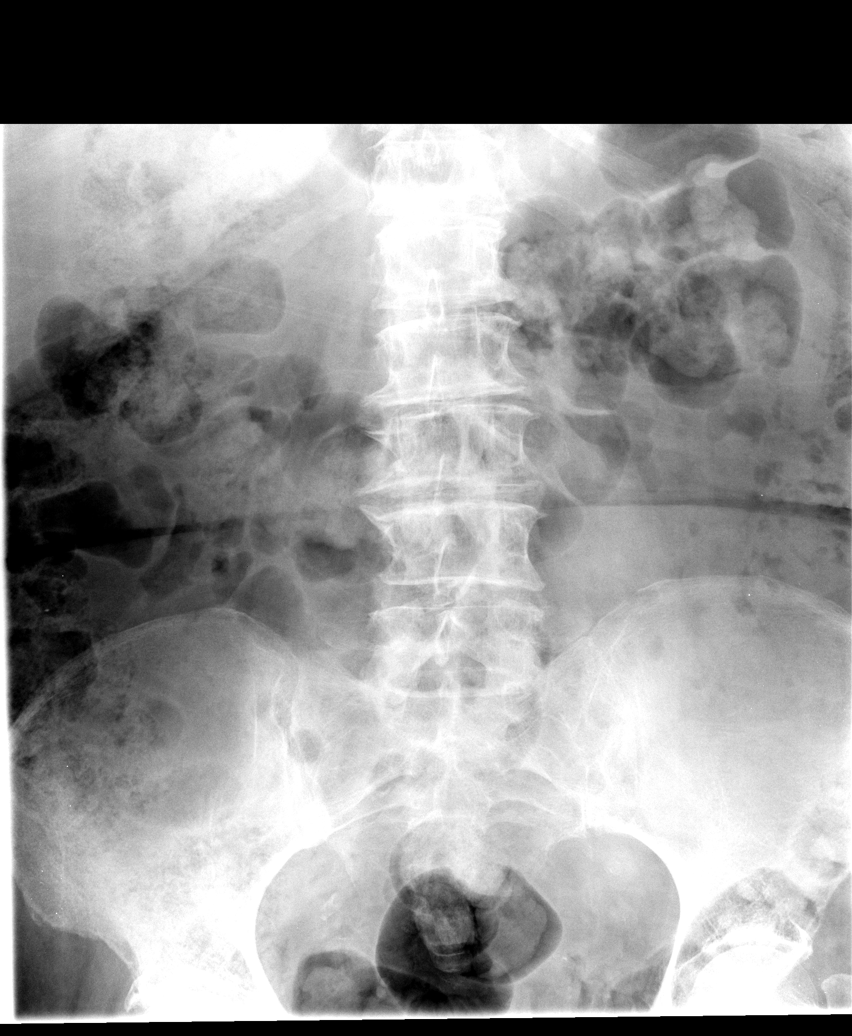

[view not recorded (2 of 5)]
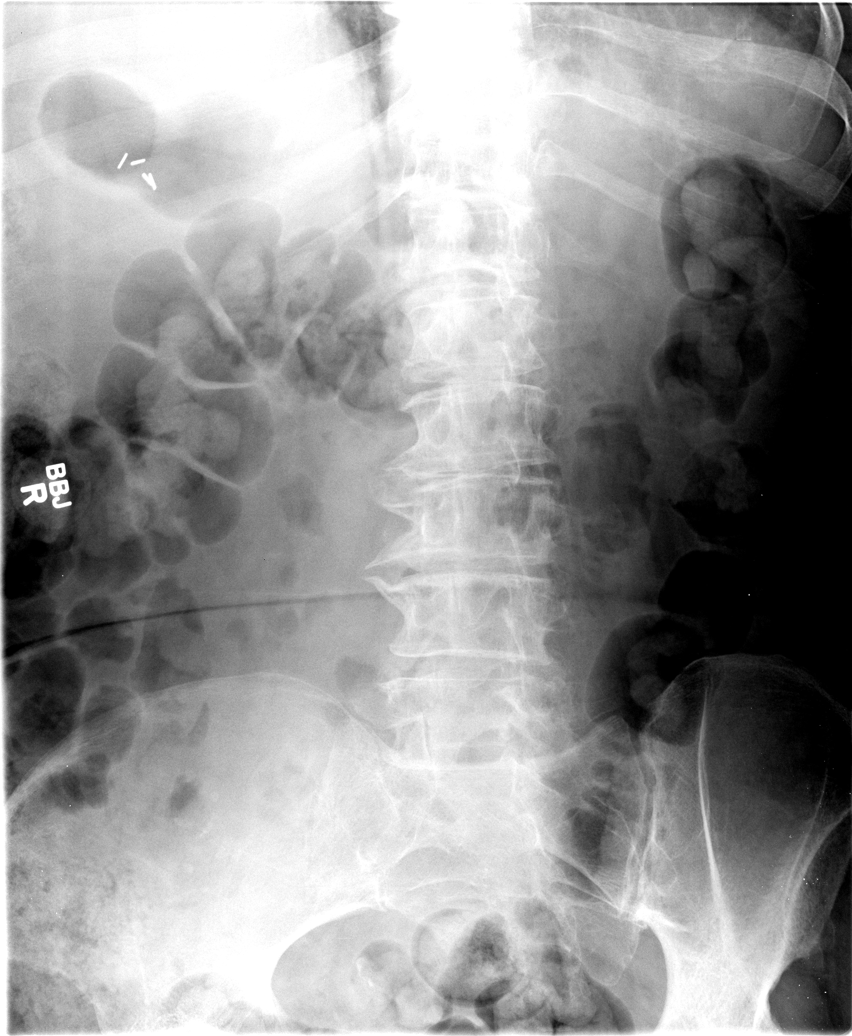

[view not recorded (3 of 5)]
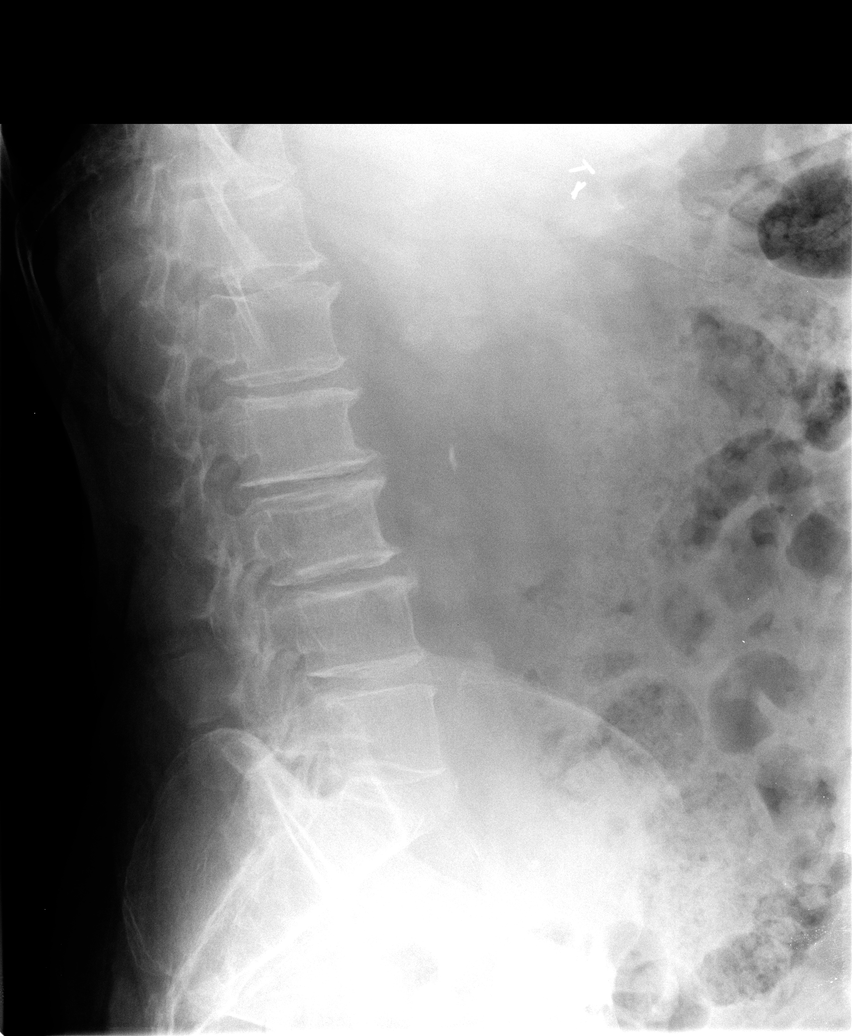

[view not recorded (4 of 5)]
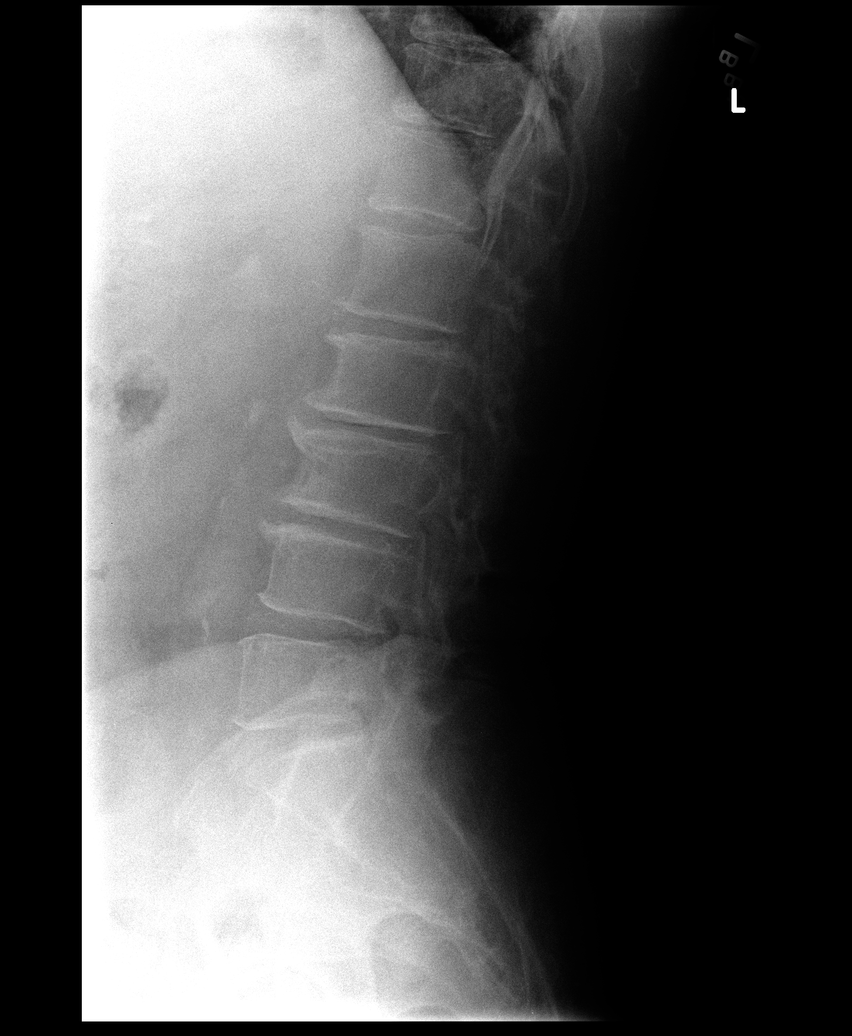

[view not recorded (5 of 5)]
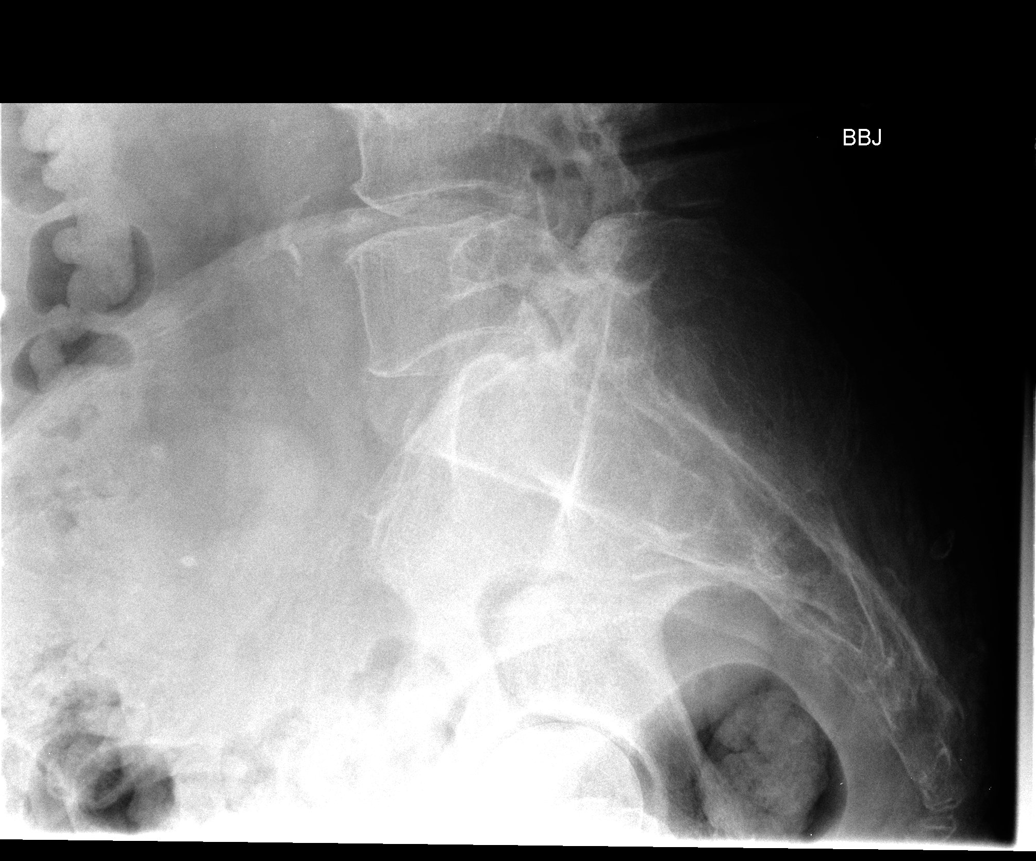

[5 of 5 positions shown; findings below may reference images not displayed]

FINDINGS: Multilevel degenerative changes, with disc height loss
and osteophyte formation, most pronounced at L2-3.  Mild anterior
wedging at L2 and L3, similar to prior. No acute displaced fracture
or malalignment.  Atherosclerotic vascular calcification.  Surgical
clips right upper quadrant.
IMPRESSION: Multilevel degenerative changes.  No acute osseous finding of the
lumbar spine.

## 2015-04-25 ENCOUNTER — Encounter (INDEPENDENT_AMBULATORY_CARE_PROVIDER_SITE_OTHER): Payer: Self-pay | Admitting: *Deleted

## 2015-05-25 ENCOUNTER — Other Ambulatory Visit (HOSPITAL_COMMUNITY): Payer: Self-pay | Admitting: Family Medicine

## 2015-05-25 ENCOUNTER — Ambulatory Visit (HOSPITAL_COMMUNITY)
Admission: RE | Admit: 2015-05-25 | Discharge: 2015-05-25 | Disposition: A | Discharge status: Home / Self Care | Payer: Medicare HMO | Source: Ambulatory Visit | Attending: Family Medicine | Admitting: Family Medicine

## 2015-05-25 DIAGNOSIS — M19041 Primary osteoarthritis, right hand: Secondary | ICD-10-CM | POA: Insufficient documentation

## 2015-05-25 DIAGNOSIS — M79642 Pain in left hand: Secondary | ICD-10-CM | POA: Diagnosis not present

## 2015-05-25 DIAGNOSIS — M545 Low back pain: Secondary | ICD-10-CM | POA: Diagnosis present

## 2015-05-25 DIAGNOSIS — M5136 Other intervertebral disc degeneration, lumbar region: Secondary | ICD-10-CM | POA: Insufficient documentation

## 2015-05-25 DIAGNOSIS — R52 Pain, unspecified: Secondary | ICD-10-CM

## 2015-05-25 DIAGNOSIS — M5442 Lumbago with sciatica, left side: Secondary | ICD-10-CM

## 2015-05-25 DIAGNOSIS — M19042 Primary osteoarthritis, left hand: Secondary | ICD-10-CM | POA: Diagnosis not present

## 2015-05-25 DIAGNOSIS — M79641 Pain in right hand: Secondary | ICD-10-CM | POA: Insufficient documentation

## 2015-06-21 ENCOUNTER — Other Ambulatory Visit (INDEPENDENT_AMBULATORY_CARE_PROVIDER_SITE_OTHER): Payer: Self-pay | Admitting: *Deleted

## 2015-06-21 DIAGNOSIS — Z800 Family history of malignant neoplasm of digestive organs: Secondary | ICD-10-CM

## 2015-06-21 DIAGNOSIS — Z86010 Personal history of colonic polyps: Secondary | ICD-10-CM

## 2015-06-26 ENCOUNTER — Encounter (HOSPITAL_COMMUNITY): Payer: Self-pay | Admitting: Emergency Medicine

## 2015-06-26 ENCOUNTER — Emergency Department (HOSPITAL_COMMUNITY): Payer: Medicare HMO

## 2015-06-26 ENCOUNTER — Inpatient Hospital Stay (HOSPITAL_COMMUNITY)
Admission: EM | Admit: 2015-06-26 | Discharge: 2015-06-29 | DRG: 092 | Disposition: A | Discharge status: Home / Self Care | Payer: Medicare HMO | Attending: Family Medicine | Admitting: Family Medicine

## 2015-06-26 DIAGNOSIS — Z90722 Acquired absence of ovaries, bilateral: Secondary | ICD-10-CM | POA: Diagnosis present

## 2015-06-26 DIAGNOSIS — I10 Essential (primary) hypertension: Secondary | ICD-10-CM | POA: Diagnosis present

## 2015-06-26 DIAGNOSIS — Z90710 Acquired absence of both cervix and uterus: Secondary | ICD-10-CM | POA: Diagnosis not present

## 2015-06-26 DIAGNOSIS — Z9049 Acquired absence of other specified parts of digestive tract: Secondary | ICD-10-CM | POA: Diagnosis present

## 2015-06-26 DIAGNOSIS — I639 Cerebral infarction, unspecified: Secondary | ICD-10-CM

## 2015-06-26 DIAGNOSIS — M5136 Other intervertebral disc degeneration, lumbar region: Secondary | ICD-10-CM | POA: Diagnosis present

## 2015-06-26 DIAGNOSIS — I5032 Chronic diastolic (congestive) heart failure: Secondary | ICD-10-CM | POA: Diagnosis present

## 2015-06-26 DIAGNOSIS — R42 Dizziness and giddiness: Secondary | ICD-10-CM

## 2015-06-26 DIAGNOSIS — Z955 Presence of coronary angioplasty implant and graft: Secondary | ICD-10-CM | POA: Diagnosis not present

## 2015-06-26 DIAGNOSIS — L409 Psoriasis, unspecified: Secondary | ICD-10-CM | POA: Diagnosis present

## 2015-06-26 DIAGNOSIS — M545 Low back pain: Secondary | ICD-10-CM | POA: Diagnosis present

## 2015-06-26 DIAGNOSIS — R269 Unspecified abnormalities of gait and mobility: Principal | ICD-10-CM | POA: Diagnosis present

## 2015-06-26 DIAGNOSIS — Z794 Long term (current) use of insulin: Secondary | ICD-10-CM

## 2015-06-26 DIAGNOSIS — R2681 Unsteadiness on feet: Secondary | ICD-10-CM | POA: Diagnosis not present

## 2015-06-26 DIAGNOSIS — R4781 Slurred speech: Secondary | ICD-10-CM | POA: Diagnosis present

## 2015-06-26 DIAGNOSIS — G8929 Other chronic pain: Secondary | ICD-10-CM | POA: Diagnosis present

## 2015-06-26 DIAGNOSIS — G4733 Obstructive sleep apnea (adult) (pediatric): Secondary | ICD-10-CM | POA: Diagnosis present

## 2015-06-26 DIAGNOSIS — Z9119 Patient's noncompliance with other medical treatment and regimen: Secondary | ICD-10-CM | POA: Diagnosis present

## 2015-06-26 DIAGNOSIS — E1165 Type 2 diabetes mellitus with hyperglycemia: Secondary | ICD-10-CM | POA: Diagnosis present

## 2015-06-26 DIAGNOSIS — E039 Hypothyroidism, unspecified: Secondary | ICD-10-CM | POA: Diagnosis present

## 2015-06-26 DIAGNOSIS — I2510 Atherosclerotic heart disease of native coronary artery without angina pectoris: Secondary | ICD-10-CM | POA: Diagnosis present

## 2015-06-26 DIAGNOSIS — E785 Hyperlipidemia, unspecified: Secondary | ICD-10-CM | POA: Diagnosis present

## 2015-06-26 DIAGNOSIS — E118 Type 2 diabetes mellitus with unspecified complications: Secondary | ICD-10-CM | POA: Diagnosis present

## 2015-06-26 DIAGNOSIS — Z951 Presence of aortocoronary bypass graft: Secondary | ICD-10-CM | POA: Diagnosis not present

## 2015-06-26 DIAGNOSIS — I8390 Asymptomatic varicose veins of unspecified lower extremity: Secondary | ICD-10-CM | POA: Diagnosis present

## 2015-06-26 DIAGNOSIS — G459 Transient cerebral ischemic attack, unspecified: Secondary | ICD-10-CM

## 2015-06-26 DIAGNOSIS — I252 Old myocardial infarction: Secondary | ICD-10-CM | POA: Diagnosis not present

## 2015-06-26 DIAGNOSIS — N390 Urinary tract infection, site not specified: Secondary | ICD-10-CM

## 2015-06-26 DIAGNOSIS — Z79899 Other long term (current) drug therapy: Secondary | ICD-10-CM | POA: Diagnosis not present

## 2015-06-26 DIAGNOSIS — B961 Klebsiella pneumoniae [K. pneumoniae] as the cause of diseases classified elsewhere: Secondary | ICD-10-CM | POA: Diagnosis present

## 2015-06-26 DIAGNOSIS — I679 Cerebrovascular disease, unspecified: Secondary | ICD-10-CM | POA: Diagnosis present

## 2015-06-26 DIAGNOSIS — Z7982 Long term (current) use of aspirin: Secondary | ICD-10-CM | POA: Diagnosis not present

## 2015-06-26 DIAGNOSIS — Z885 Allergy status to narcotic agent status: Secondary | ICD-10-CM

## 2015-06-26 DIAGNOSIS — E1140 Type 2 diabetes mellitus with diabetic neuropathy, unspecified: Secondary | ICD-10-CM | POA: Diagnosis present

## 2015-06-26 DIAGNOSIS — Z8673 Personal history of transient ischemic attack (TIA), and cerebral infarction without residual deficits: Secondary | ICD-10-CM | POA: Diagnosis not present

## 2015-06-26 LAB — CBC
HEMATOCRIT: 37.7 % (ref 36.0–46.0)
HEMOGLOBIN: 11 g/dL — AB (ref 12.0–15.0)
MCH: 21.6 pg — ABNORMAL LOW (ref 26.0–34.0)
MCHC: 29.2 g/dL — ABNORMAL LOW (ref 30.0–36.0)
MCV: 73.9 fL — ABNORMAL LOW (ref 78.0–100.0)
Platelets: 297 10*3/uL (ref 150–400)
RBC: 5.1 MIL/uL (ref 3.87–5.11)
RDW: 18.4 % — ABNORMAL HIGH (ref 11.5–15.5)
WBC: 11.2 10*3/uL — ABNORMAL HIGH (ref 4.0–10.5)

## 2015-06-26 LAB — COMPREHENSIVE METABOLIC PANEL
ALK PHOS: 77 U/L (ref 38–126)
ALT: 20 U/L (ref 14–54)
AST: 15 U/L (ref 15–41)
Albumin: 3.5 g/dL (ref 3.5–5.0)
Anion gap: 7 (ref 5–15)
BILIRUBIN TOTAL: 0.4 mg/dL (ref 0.3–1.2)
BUN: 14 mg/dL (ref 6–20)
CHLORIDE: 105 mmol/L (ref 101–111)
CO2: 27 mmol/L (ref 22–32)
Calcium: 9.2 mg/dL (ref 8.9–10.3)
Creatinine, Ser: 0.85 mg/dL (ref 0.44–1.00)
GFR calc Af Amer: >60 mL/min (ref >60)
GLUCOSE: 341 mg/dL — AB (ref 65–99)
Potassium: 4.9 mmol/L (ref 3.5–5.1)
Sodium: 139 mmol/L (ref 135–145)
Total Protein: 6.7 g/dL (ref 6.5–8.1)

## 2015-06-26 LAB — I-STAT CHEM 8, ED
BUN: 15 mg/dL (ref 6–20)
CREATININE: 0.8 mg/dL (ref 0.44–1.00)
Calcium, Ion: 1.24 mmol/L (ref 1.13–1.30)
Chloride: 103 mmol/L (ref 101–111)
GLUCOSE: 334 mg/dL — AB (ref 65–99)
HEMATOCRIT: 39 % (ref 36.0–46.0)
HEMOGLOBIN: 13.3 g/dL (ref 12.0–15.0)
POTASSIUM: 4.3 mmol/L (ref 3.5–5.1)
Sodium: 140 mmol/L (ref 135–145)
TCO2: 25 mmol/L (ref 0–100)

## 2015-06-26 LAB — APTT: APTT: 27 s (ref 24–37)

## 2015-06-26 LAB — DIFFERENTIAL
BASOS ABS: 0 10*3/uL (ref 0.0–0.1)
Basophils Relative: 0 % (ref 0–1)
Eosinophils Absolute: 0.3 10*3/uL (ref 0.0–0.7)
Eosinophils Relative: 3 % (ref 0–5)
Lymphocytes Relative: 26 % (ref 12–46)
Lymphs Abs: 2.9 10*3/uL (ref 0.7–4.0)
MONO ABS: 0.9 10*3/uL (ref 0.1–1.0)
Monocytes Relative: 8 % (ref 3–12)
Neutro Abs: 7.2 10*3/uL (ref 1.7–7.7)
Neutrophils Relative %: 64 % (ref 43–77)

## 2015-06-26 LAB — URINALYSIS, ROUTINE W REFLEX MICROSCOPIC
BILIRUBIN URINE: NEGATIVE
Ketones, ur: NEGATIVE mg/dL
Nitrite: POSITIVE — AB
Protein, ur: NEGATIVE mg/dL
SPECIFIC GRAVITY, URINE: 1.025 (ref 1.005–1.030)
UROBILINOGEN UA: 0.2 mg/dL (ref 0.0–1.0)
pH: 5.5 (ref 5.0–8.0)

## 2015-06-26 LAB — PROTIME-INR
INR: 0.98 (ref 0.00–1.49)
PROTHROMBIN TIME: 13.2 s (ref 11.6–15.2)

## 2015-06-26 LAB — RAPID URINE DRUG SCREEN, HOSP PERFORMED
Amphetamines: NOT DETECTED
BENZODIAZEPINES: NOT DETECTED
Barbiturates: NOT DETECTED
Cocaine: NOT DETECTED
OPIATES: NOT DETECTED
TETRAHYDROCANNABINOL: NOT DETECTED

## 2015-06-26 LAB — URINE MICROSCOPIC-ADD ON

## 2015-06-26 LAB — ETHANOL: Alcohol, Ethyl (B): <5 mg/dL (ref <5)

## 2015-06-26 LAB — I-STAT TROPONIN, ED: Troponin i, poc: 0 ng/mL (ref 0.00–0.08)

## 2015-06-26 LAB — GLUCOSE, CAPILLARY: Glucose-Capillary: 314 mg/dL — ABNORMAL HIGH (ref 65–99)

## 2015-06-26 MED ORDER — SODIUM CHLORIDE 0.9 % IJ SOLN
3.0000 mL | Freq: Two times a day (BID) | INTRAMUSCULAR | Status: DC
  Administered 2015-06-26 – 2015-06-28 (×4): 3 mL via INTRAVENOUS

## 2015-06-26 MED ORDER — LEVOTHYROXINE SODIUM 25 MCG PO TABS: 25.0000 ug | ORAL_TABLET | ORAL | Status: DC

## 2015-06-26 MED ORDER — ENOXAPARIN SODIUM 40 MG/0.4ML ~~LOC~~ SOLN
40.0000 mg | SUBCUTANEOUS | Status: DC
  Administered 2015-06-26 – 2015-06-28 (×3): 40 mg via SUBCUTANEOUS
  Filled 2015-06-26 (×3): qty 0.4

## 2015-06-26 MED ORDER — METFORMIN HCL 500 MG PO TABS
500.0000 mg | ORAL_TABLET | Freq: Two times a day (BID) | ORAL | Status: DC
  Administered 2015-06-27 – 2015-06-29 (×5): 500 mg via ORAL
  Filled 2015-06-26 (×5): qty 1

## 2015-06-26 MED ORDER — NITROGLYCERIN 0.4 MG SL SUBL: 0.4000 mg | SUBLINGUAL_TABLET | SUBLINGUAL | Status: DC | PRN

## 2015-06-26 MED ORDER — ONDANSETRON HCL 4 MG PO TABS: 4.0000 mg | ORAL_TABLET | Freq: Four times a day (QID) | ORAL | Status: DC | PRN

## 2015-06-26 MED ORDER — INSULIN ASPART 100 UNIT/ML ~~LOC~~ SOLN
0.0000 [IU] | Freq: Three times a day (TID) | SUBCUTANEOUS | Status: DC
  Administered 2015-06-27 (×2): 11 [IU] via SUBCUTANEOUS
  Administered 2015-06-27: 7 [IU] via SUBCUTANEOUS
  Administered 2015-06-28: 11 [IU] via SUBCUTANEOUS
  Administered 2015-06-28: 4 [IU] via SUBCUTANEOUS
  Administered 2015-06-28: 11 [IU] via SUBCUTANEOUS
  Administered 2015-06-29: 7 [IU] via SUBCUTANEOUS

## 2015-06-26 MED ORDER — INSULIN DETEMIR 100 UNIT/ML ~~LOC~~ SOLN
50.0000 [IU] | Freq: Two times a day (BID) | SUBCUTANEOUS | Status: DC
  Administered 2015-06-26 – 2015-06-29 (×6): 50 [IU] via SUBCUTANEOUS
  Filled 2015-06-26 (×8): qty 0.5

## 2015-06-26 MED ORDER — SODIUM CHLORIDE 0.9 % IV SOLN
INTRAVENOUS | Status: AC
  Administered 2015-06-26: 20:00:00 via INTRAVENOUS

## 2015-06-26 MED ORDER — PRAVASTATIN SODIUM 40 MG PO TABS
20.0000 mg | ORAL_TABLET | Freq: Every day | ORAL | Status: DC
  Administered 2015-06-27 – 2015-06-28 (×2): 20 mg via ORAL
  Filled 2015-06-26 (×3): qty 1

## 2015-06-26 MED ORDER — DEXTROSE 5 % IV SOLN
INTRAVENOUS | Status: AC
  Filled 2015-06-26: qty 10

## 2015-06-26 MED ORDER — ONDANSETRON HCL 4 MG/2ML IJ SOLN: 4.0000 mg | Freq: Four times a day (QID) | INTRAMUSCULAR | Status: DC | PRN

## 2015-06-26 MED ORDER — INSULIN ASPART 100 UNIT/ML ~~LOC~~ SOLN
0.0000 [IU] | Freq: Every day | SUBCUTANEOUS | Status: DC
  Administered 2015-06-26: 4 [IU] via SUBCUTANEOUS
  Administered 2015-06-27: 3 [IU] via SUBCUTANEOUS

## 2015-06-26 MED ORDER — LISINOPRIL 10 MG PO TABS
10.0000 mg | ORAL_TABLET | Freq: Every morning | ORAL | Status: DC
  Administered 2015-06-27 – 2015-06-29 (×3): 10 mg via ORAL
  Filled 2015-06-26 (×3): qty 1

## 2015-06-26 MED ORDER — ASPIRIN EC 325 MG PO TBEC
325.0000 mg | DELAYED_RELEASE_TABLET | Freq: Every day | ORAL | Status: DC
  Administered 2015-06-27 – 2015-06-29 (×3): 325 mg via ORAL
  Filled 2015-06-26 (×3): qty 1

## 2015-06-26 MED ORDER — ZOLPIDEM TARTRATE 5 MG PO TABS
5.0000 mg | ORAL_TABLET | Freq: Every day | ORAL | Status: DC
  Administered 2015-06-26 – 2015-06-28 (×3): 5 mg via ORAL
  Filled 2015-06-26 (×3): qty 1

## 2015-06-26 MED ORDER — SODIUM CHLORIDE 0.9 % IJ SOLN
3.0000 mL | Freq: Two times a day (BID) | INTRAMUSCULAR | Status: DC
  Administered 2015-06-28 (×2): 3 mL via INTRAVENOUS

## 2015-06-26 MED ORDER — MECLIZINE HCL 12.5 MG PO TABS
25.0000 mg | ORAL_TABLET | Freq: Once | ORAL | Status: AC
  Administered 2015-06-26: 25 mg via ORAL
  Filled 2015-06-26: qty 2

## 2015-06-26 MED ORDER — ASPIRIN EC 325 MG PO TBEC: 325.0000 mg | DELAYED_RELEASE_TABLET | Freq: Every day | ORAL | Status: DC

## 2015-06-26 MED ORDER — DIAZEPAM 5 MG PO TABS
5.0000 mg | ORAL_TABLET | Freq: Four times a day (QID) | ORAL | Status: DC | PRN
  Administered 2015-06-26 – 2015-06-29 (×5): 5 mg via ORAL
  Filled 2015-06-26 (×6): qty 1

## 2015-06-26 MED ORDER — ENOXAPARIN SODIUM 40 MG/0.4ML ~~LOC~~ SOLN: 40.0000 mg | SUBCUTANEOUS | Status: DC

## 2015-06-26 MED ORDER — LEVOTHYROXINE SODIUM 25 MCG PO TABS
25.0000 ug | ORAL_TABLET | Freq: Every day | ORAL | Status: DC
  Administered 2015-06-27 – 2015-06-29 (×3): 25 ug via ORAL
  Filled 2015-06-26 (×3): qty 1

## 2015-06-26 MED ORDER — DEXTROSE 5 % IV SOLN
1.0000 g | INTRAVENOUS | Status: DC
  Administered 2015-06-26 – 2015-06-28 (×3): 1 g via INTRAVENOUS
  Filled 2015-06-26 (×3): qty 10

## 2015-06-26 MED ORDER — ALBUTEROL SULFATE (2.5 MG/3ML) 0.083% IN NEBU: 2.5000 mg | INHALATION_SOLUTION | Freq: Four times a day (QID) | RESPIRATORY_TRACT | Status: DC | PRN

## 2015-06-26 NOTE — ED Provider Notes (Signed)
CSN: [id number]     Arrival date & time 06/26/15  1614 History   First MD Initiated Contact with Patient 06/26/15 1632     Chief Complaint  Patient presents with  . Dizziness     HPI Pt was seen at 1635. Per pt and her husband, c/o sudden onset and resolution of multiple intermittent episodes of "dizziness" for the past 8 days. Pt describes her dizziness as "I can't walk right." Has been associated with profuse "sweating." Pt states her symptoms last several hours before resolving. Denies CP/palpitations, no SOB/cough, no abd pain, no N/V/D, no headache, no visual changes, no slurred speech, no facial droop, no focal motor weakness, no tingling/numbness in extremities.    Past Medical History  Diagnosis Date  . Arteriosclerotic cardiovascular disease (ASCVD) CARDIOLOGIST-   DR Dietrich Pates     CABG surgery in 2001; nl stress nuclear-2007; angiography in 01/2006- TO of LAD; 70% ostial stenosis      of a small ramus; patent grafts; normal EF; cath in 12/2009 essentially unchanged; EF-66%;  Congestive heart failure with preserved LV systolic function  . Hypertension   . Varicose veins   . Hypothyroidism   . Insulin dependent type 2 diabetes mellitus   . Peripheral neuropathy   . Diastolic CHF   . Hyperlipidemia   . History of CVA (cerebrovascular accident)     2013--  LACUNAR INFARCTIONS RIGHT THALAMUS---  residual left side of  lip numb  and left eye vision worse (which corrented lens implant from cataract extraction)  . Lesion of bladder   . Hematuria   . Arthritis   . DDD (degenerative disc disease), lumbar   . OSA (obstructive sleep apnea)     NON-COMPLIANT CPAP  . Chronic obstructive pulmonary disease     Chronic bronchitis  . History of MI (myocardial infarction)     07/ 2001   s/p  cabg x2  . S/P CABG x 2     07-02-2000  . SUI (stress urinary incontinence, female)   . Lower urinary tract symptoms (LUTS)   . Psoriasis     lower leg  . Thinning of skin    Past Surgical History   Procedure Laterality Date  . Rotator cuff repair Right 1995  . Lumbar spine surgery  x2    yrs ago  . Carpal tunnel release Bilateral yrs ago  . Orif humerus fracture Right 04-18-2008  . Knee arthroscopy Left 10-18-2003    SYNOVECTOMY  . Repair flexor tendon hand Right 09-20-2008    CARPI RADIALIS TENDON TRANSFER TO EXTENSOR TO INDEX, MIDDLE, RING , LITTLE FINGERS AND DIGITI MININI  . Cardiovascular stress test  08-28-2011   DR ROTHBART    NEGATIVE NUCLEAR STUDY/  GLOBAL LVSF/  EF 65%  . Cholecystectomy  1990  . Appendectomy  age 6  . Total abdominal hysterectomy w/ bilateral salpingoophorectomy  age 30  . Cataract extraction w/ intraocular lens  implant, bilateral  2013  . Cardiac catheterization  1991    No sig. cad  . Coronary angioplasty  11-13-1999    balloon angioplasty to mLAD , d2 of LAD  . Coronary angioplasty with stent placement  01-17-2000    PCI stenting to mLAD & D2 of LAD  . Cardiac catheterization  07-01-2000    high grade in-stent restenosis  . Cardiac catheterization  04-30-2001    single native vessel cad/ graft patent  . Cardiac catheterization  02-13-2006  dr Jenness [place] cad with total  occluded LAD  and  70% ostial of small ramus/  grafts patent/  normal ef  . Cardiac catheterization  01-20-2000  dr Clifton James    essentially unchanged;  severe single vessel cad with diffuse disease throughout CFX and RCA/ ef 66%;  chf with preserved LVSF  . Coronary artery bypass graft  07-02-2000   DR Tressie Stalker    SVG to diagonal, LIMA to LAD  . Transthoracic echocardiogram  12-12-2013    MILD LVH/  EF 60-65%/  GRADE I DIASTOLIC DYSFUNCTION/  MILD LAE  . Cystoscopy w/ retrogrades Bilateral 08/09/2014    Procedure: CYSTOSCOPY, BILATERAL RETROGRADE, HYDRODISTENSION, BLADDER BIOPSY WITH FULGERATION, INSTILL PYRIDIUM AND MARCAINE ;  Surgeon: Anner Crete, MD;  Location: [facility];  Service: Urology;  Laterality: Bilateral;   Family History  Problem Relation  Age of Onset  . Coronary artery disease      Multiple first and second-degree relatives  . Aneurysm      Cerebral circulation  . Hypertension Mother   . Colon cancer Sister   . Clotting disorder      Children diagnosed with hypercoagulable state   History  Substance Use Topics  . Smoking status: Never Smoker   . Smokeless tobacco: Never Used  . Alcohol Use: No   OB History    Gravida Para Term Preterm AB TAB SAB Ectopic Multiple Living   7 7 7       4      Review of Systems ROS: Statement: All systems negative except as marked or noted in the HPI; Constitutional: Negative for fever and chills. ; ; Eyes: Negative for eye pain, redness and discharge. ; ; ENMT: Negative for ear pain, hoarseness, nasal congestion, sinus pressure and sore throat. ; ; Cardiovascular: Negative for chest pain, palpitations, dyspnea and peripheral edema. ; ; Respiratory: Negative for cough, wheezing and stridor. ; ; Gastrointestinal: Negative for nausea, vomiting, diarrhea, abdominal pain, blood in stool, hematemesis, jaundice and rectal bleeding. . ; ; Genitourinary: Negative for dysuria, flank pain and hematuria. ; ; Musculoskeletal: Negative for back pain and neck pain. Negative for swelling and trauma.; ; Skin: +diaphoresis. Negative for pruritus, rash, abrasions, blisters, bruising and skin lesion.; ; Neuro: +"dizziness," ataxia, near syncope. Negative for headache and neck stiffness. Negative for weakness, altered level of consciousness , altered mental status, extremity weakness, paresthesias, involuntary movement, seizure and syncope.      Allergies  Codeine  Home Medications   Prior to Admission medications   Medication Sig Start Date End Date Taking? Authorizing Provider  albuterol (PROVENTIL) (2.5 MG/3ML) 0.083% nebulizer solution Take 3 mLs (2.5 mg total) by nebulization every 6 (six) hours as needed for wheezing or shortness of breath. 09/11/14  Yes Eber Hong, MD  aspirin EC 325 MG tablet Take  325 mg by mouth daily.   Yes Historical Provider, MD  diazepam (VALIUM) 5 MG tablet Take 5 mg by mouth every 6 (six) hours as needed for anxiety.   Yes Historical Provider, MD  insulin detemir (LEVEMIR) 100 UNIT/ML injection Inject 30-70 Units into the skin 2 (two) times daily. 30 units in the morning and 70 units at bedtime   Yes Historical Provider, MD  insulin lispro (HUMALOG) 100 UNIT/ML injection Inject 20 Units into the skin 3 (three) times daily.    Yes Historical Provider, MD  levothyroxine (SYNTHROID, LEVOTHROID) 25 MCG tablet Take 25 mcg by mouth every morning.    Yes Historical Provider, MD  lisinopril (PRINIVIL,ZESTRIL) 10 MG tablet  Take 1 tablet by mouth every morning.  11/09/13  Yes Historical Provider, MD  metFORMIN (GLUCOPHAGE) 500 MG tablet Take 500 mg by mouth 2 (two) times daily with a meal.     Yes Historical Provider, MD  nitroGLYCERIN (NITROSTAT) 0.4 MG SL tablet Place 0.4 mg under the tongue every 5 (five) minutes as needed for chest pain.    Yes Historical Provider, MD  pravastatin (PRAVACHOL) 20 MG tablet Take 20 mg by mouth daily.   Yes Historical Provider, MD  zolpidem (AMBIEN) 5 MG tablet Take 5 mg by mouth at bedtime. 08/12/14  Yes Historical Provider, MD   BP 158/73 mmHg  Pulse 70  Temp(Src) 98.1 F (36.7 C) (Oral)  Resp 22  Ht 5\' 4"  (1.626 m)  Wt 190 lb (86.183 kg)  BMI 32.60 kg/m2  SpO2 95% Physical Exam  1640: Physical examination:  Nursing notes reviewed; Vital signs and O2 SAT reviewed;  Constitutional: Well developed, Well nourished, Well hydrated, In no acute distress; Head:  Normocephalic, atraumatic; Eyes: EOMI, PERRL, No scleral icterus; ENMT: TM's clear bilat. Mouth and pharynx normal, Mucous membranes moist; Neck: Supple, Full range of motion, No lymphadenopathy; Cardiovascular: Regular rate and rhythm, No gallop; Respiratory: Breath sounds clear & equal bilaterally, No wheezes.  Speaking full sentences with ease, Normal respiratory effort/excursion;  Chest: Nontender, Movement normal; Abdomen: Soft, Nontender, Nondistended, Normal bowel sounds; Genitourinary: No CVA tenderness; Extremities: Pulses normal, No tenderness, No edema, No calf edema or asymmetry.; Neuro: AA&Ox3, Major CN grossly intact. Speech clear.  No facial droop.  No nystagmus. Grips equal. Strength 5/5 equal bilat UE's and LE's.  DTR 2/4 equal bilat UE's and LE's.  No gross sensory deficits.  Normal cerebellar testing bilat LE's (heel-shin), +ataxic bilat UE's (finger-nose).; Skin: Color normal, Warm, Dry.   ED Course  Procedures     EKG Interpretation   Date/Time:  Monday June 26 2015 16:36:41 EDT Ventricular Rate:  76 PR Interval:  208 QRS Duration: 104 QT Interval:  402 QTC Calculation: 452 R Axis:   -57 Text Interpretation:  Sinus rhythm Left axis deviation Left anterior  fascicular block Anterior infarct, old Nonspecific T wave abnormality  Lateral leads Left ventricular hypertrophy Baseline wander When compared  with ECG of 11/13/2014 No significant change was found Confirmed by  [facility]  MD, Nicholos Johns [phone]) on 06/26/2015 4:52:34 PM      MDM  MDM Reviewed: previous chart, nursing note and vitals Reviewed previous: labs and ECG Interpretation: labs, ECG, x-ray and CT scan      Results for orders placed or performed during the hospital encounter of 06/26/15  Ethanol  Result Value Ref Range   Alcohol, Ethyl (B) <5 <5 mg/dL  Protime-INR  Result Value Ref Range   Prothrombin Time 13.2 11.6 - 15.2 seconds   INR 0.98 0.00 - 1.49  APTT  Result Value Ref Range   aPTT 27 24 - 37 seconds  CBC  Result Value Ref Range   WBC 11.2 (H) 4.0 - 10.5 K/uL   RBC 5.10 3.87 - 5.11 MIL/uL   Hemoglobin 11.0 (L) 12.0 - 15.0 g/dL   HCT 24.4 01.0 - 27.2 %   MCV 73.9 (L) 78.0 - 100.0 fL   MCH 21.6 (L) 26.0 - 34.0 pg   MCHC 29.2 (L) 30.0 - 36.0 g/dL   RDW 53.6 (H) 64.4 - 03.4 %   Platelets 297 150 - 400 K/uL  Differential  Result Value Ref Range   Neutrophils  Relative % 64 43 -  77 %   Neutro Abs 7.2 1.7 - 7.7 K/uL   Lymphocytes Relative 26 12 - 46 %   Lymphs Abs 2.9 0.7 - 4.0 K/uL   Monocytes Relative 8 3 - 12 %   Monocytes Absolute 0.9 0.1 - 1.0 K/uL   Eosinophils Relative 3 0 - 5 %   Eosinophils Absolute 0.3 0.0 - 0.7 K/uL   Basophils Relative 0 0 - 1 %   Basophils Absolute 0.0 0.0 - 0.1 K/uL  Comprehensive metabolic panel  Result Value Ref Range   Sodium 139 135 - 145 mmol/L   Potassium 4.9 3.5 - 5.1 mmol/L   Chloride 105 101 - 111 mmol/L   CO2 27 22 - 32 mmol/L   Glucose, Bld 341 (H) 65 - 99 mg/dL   BUN 14 6 - 20 mg/dL   Creatinine, Ser 1.61 0.44 - 1.00 mg/dL   Calcium 9.2 8.9 - 09.6 mg/dL   Total Protein 6.7 6.5 - 8.1 g/dL   Albumin 3.5 3.5 - 5.0 g/dL   AST 15 15 - 41 U/L   ALT 20 14 - 54 U/L   Alkaline Phosphatase 77 38 - 126 U/L   Total Bilirubin 0.4 0.3 - 1.2 mg/dL   GFR calc non Af Amer >60 >60 mL/min   GFR calc Af Amer >60 >60 mL/min   Anion gap 7 5 - 15  Urine rapid drug screen (hosp performed)not at [facility]  Result Value Ref Range   Opiates NONE DETECTED NONE DETECTED   Cocaine NONE DETECTED NONE DETECTED   Benzodiazepines NONE DETECTED NONE DETECTED   Amphetamines NONE DETECTED NONE DETECTED   Tetrahydrocannabinol NONE DETECTED NONE DETECTED   Barbiturates NONE DETECTED NONE DETECTED  Urinalysis, Routine w reflex microscopic (not at [facility])  Result Value Ref Range   Color, Urine YELLOW YELLOW   APPearance HAZY (A) CLEAR   Specific Gravity, Urine 1.025 1.005 - 1.030   pH 5.5 5.0 - 8.0   Glucose, UA >1000 (A) NEGATIVE mg/dL   Hgb urine dipstick TRACE (A) NEGATIVE   Bilirubin Urine NEGATIVE NEGATIVE   Ketones, ur NEGATIVE NEGATIVE mg/dL   Protein, ur NEGATIVE NEGATIVE mg/dL   Urobilinogen, UA 0.2 0.0 - 1.0 mg/dL   Nitrite POSITIVE (A) NEGATIVE   Leukocytes, UA SMALL (A) NEGATIVE  Urine microscopic-add on  Result Value Ref Range   Squamous Epithelial / LPF FEW (A) RARE   WBC, UA TOO NUMEROUS TO COUNT <3 WBC/hpf    RBC / HPF 3-6 <3 RBC/hpf   Bacteria, UA MANY (A) RARE  I-Stat Chem 8, ED  (not at [facility], [facility])  Result Value Ref Range   Sodium 140 135 - 145 mmol/L   Potassium 4.3 3.5 - 5.1 mmol/L   Chloride 103 101 - 111 mmol/L   BUN 15 6 - 20 mg/dL   Creatinine, Ser 0.45 0.44 - 1.00 mg/dL   Glucose, Bld 409 (H) 65 - 99 mg/dL   Calcium, Ion 8.11 9.14 - 1.30 mmol/L   TCO2 25 0 - 100 mmol/L   Hemoglobin 13.3 12.0 - 15.0 g/dL   HCT 78.2 95.6 - 21.3 %  I-stat troponin, ED (not at [facility], [facility])  Result Value Ref Range   Troponin i, poc 0.00 0.00 - 0.08 ng/mL   Comment 3           Ct Head Wo Contrast 06/26/2015   CLINICAL DATA:  Dizziness, 8 days duration.  EXAM: CT HEAD WITHOUT CONTRAST  TECHNIQUE: Contiguous axial images were obtained  from the base of the skull through the vertex without intravenous contrast.  COMPARISON:  07/16/2012  FINDINGS: The brain shows generalized atrophy. There chronic small-vessel ischemic changes within the hemispheric deep white matter. No sign of acute infarction, mass lesion, hemorrhage, hydrocephalus or extra-axial collection. No visible abnormality affecting the brainstem or cerebellum. No fluid in the sinuses. Small amount of fluid in the mastoid tip regions bilaterally. No calvarial abnormality otherwise. There is atherosclerotic calcification of the major vessels at the base of the brain.  IMPRESSION: Atrophy and chronic small-vessel disease of the cerebral hemispheric white matter. No acute finding or specific cause of dizziness.   Electronically Signed   By: Paulina FusiMark  Shogry M.D.   On: 06/26/2015 17:44    1855:  +UTI, UC pending; will dose IV rocephin. Pt not orthostatic on VS. Passed swallow screen. Does not feel improved after antivert. Concern regarding TIA/CVA. Dx and testing d/w pt and family.  Questions answered.  Verb understanding, agreeable to admit.   T/C to [facility] Dr. Karilyn CotaGosrani, case discussed, including:  HPI, pertinent PM/SHx, VS/PE, dx testing, ED course and treatment:   Agreeable to admit, requests to write temporary orders, obtain tele bed to team APAdmits.   Samuel JesterKathleen Jahnessa Vanduyn, DO 06/28/15 1157

## 2015-06-26 NOTE — ED Notes (Signed)
Pt reports dizziness that began 8 days ago. Pt states she had a near syncopal episode yesterday.

## 2015-06-26 NOTE — H&P (Signed)
[facility] History and Physical  AMARRAH MEINHART ZOX:[id number] DOB: Aug 11, 1936 DOA: 06/26/2015  Referring physician: ER, Dr. Clarene Duke PCP: Alice Reichert, MD   Chief Complaint: Gait instability  HPI: [name] [name] is a 79 y.o. female  This is a 79 year old diabetic lady who has had a stroke before and now presents with one-week history of instability of gait. She feels that she cannot walk in a straight line and is all over the  place, feels that she is drunk although she has never been drunk in her life. She also has slurred speech. She does not have diplopia. There is no nausea, vomiting, headache, other visual disturbances or any specific limb weakness. She is now being admitted for further investigation and management.   Review of Systems:  Apart from symptoms above, all systems negative.   Past Medical History  Diagnosis Date  . Arteriosclerotic cardiovascular disease (ASCVD) CARDIOLOGIST-   DR Dietrich Pates     CABG surgery in 2001; nl stress nuclear-2007; angiography in 01/2006- TO of LAD; 70% ostial stenosis      of a small ramus; patent grafts; normal EF; cath in 12/2009 essentially unchanged; EF-66%;  Congestive heart failure with preserved LV systolic function  . Hypertension   . Varicose veins   . Hypothyroidism   . Insulin dependent type 2 diabetes mellitus   . Peripheral neuropathy   . Diastolic CHF   . Hyperlipidemia   . History of CVA (cerebrovascular accident)     2013--  LACUNAR INFARCTIONS RIGHT THALAMUS---  residual left side of  lip numb  and left eye vision worse (which corrented lens implant from cataract extraction)  . Lesion of bladder   . Hematuria   . Arthritis   . DDD (degenerative disc disease), lumbar   . OSA (obstructive sleep apnea)     NON-COMPLIANT CPAP  . Chronic obstructive pulmonary disease     Chronic bronchitis  . History of MI (myocardial infarction)     07/ 2001   s/p  cabg x2  . S/P CABG x 2     07-02-2000  . SUI (stress urinary  incontinence, female)   . Lower urinary tract symptoms (LUTS)   . Psoriasis     lower leg  . Thinning of skin    Past Surgical History  Procedure Laterality Date  . Rotator cuff repair Right 1995  . Lumbar spine surgery  x2    yrs ago  . Carpal tunnel release Bilateral yrs ago  . Orif humerus fracture Right 04-18-2008  . Knee arthroscopy Left 10-18-2003    SYNOVECTOMY  . Repair flexor tendon hand Right 09-20-2008    CARPI RADIALIS TENDON TRANSFER TO EXTENSOR TO INDEX, MIDDLE, RING , LITTLE FINGERS AND DIGITI MININI  . Cardiovascular stress test  08-28-2011   DR ROTHBART    NEGATIVE NUCLEAR STUDY/  GLOBAL LVSF/  EF 65%  . Cholecystectomy  1990  . Appendectomy  age 39  . Total abdominal hysterectomy w/ bilateral salpingoophorectomy  age 37  . Cataract extraction w/ intraocular lens  implant, bilateral  2013  . Cardiac catheterization  1991    No sig. cad  . Coronary angioplasty  11-13-1999    balloon angioplasty to mLAD , d2 of LAD  . Coronary angioplasty with stent placement  01-17-2000    PCI stenting to mLAD & D2 of LAD  . Cardiac catheterization  07-01-2000    high grade in-stent restenosis  . Cardiac catheterization  04-30-2001    single native  vessel cad/ graft patent  . Cardiac catheterization  02-13-2006  dr Juanda Chance    2V cad with total occluded LAD  and  70% ostial of small ramus/  grafts patent/  normal ef  . Cardiac catheterization  01-20-2000  dr Clifton James    essentially unchanged;  severe single vessel cad with diffuse disease throughout CFX and RCA/ ef 66%;  chf with preserved LVSF  . Coronary artery bypass graft  07-02-2000   DR Tressie Stalker    SVG to diagonal, LIMA to LAD  . Transthoracic echocardiogram  12-12-2013    MILD LVH/  EF 60-65%/  GRADE I DIASTOLIC DYSFUNCTION/  MILD LAE  . Cystoscopy w/ retrogrades Bilateral 08/09/2014    Procedure: CYSTOSCOPY, BILATERAL RETROGRADE, HYDRODISTENSION, BLADDER BIOPSY WITH FULGERATION, INSTILL PYRIDIUM AND MARCAINE ;   Surgeon: Anner Crete, MD;  Location: [facility];  Service: Urology;  Laterality: Bilateral;   Social History:  reports that she has never smoked. She has never used smokeless tobacco. She reports that she does not drink alcohol or use illicit drugs.  Allergies  Allergen Reactions  . Codeine Itching    Family History  Problem Relation Age of Onset  . Coronary artery disease      Multiple first and second-degree relatives  . Aneurysm      Cerebral circulation  . Hypertension Mother   . Colon cancer Sister   . Clotting disorder      Children diagnosed with hypercoagulable state    Prior to Admission medications   Medication Sig Start Date End Date Taking? Authorizing Provider  albuterol (PROVENTIL) (2.5 MG/3ML) 0.083% nebulizer solution Take 3 mLs (2.5 mg total) by nebulization every 6 (six) hours as needed for wheezing or shortness of breath. 09/11/14  Yes Eber Hong, MD  aspirin EC 325 MG tablet Take 325 mg by mouth daily.   Yes Historical Provider, MD  diazepam (VALIUM) 5 MG tablet Take 5 mg by mouth every 6 (six) hours as needed for anxiety.   Yes Historical Provider, MD  insulin detemir (LEVEMIR) 100 UNIT/ML injection Inject 30-70 Units into the skin 2 (two) times daily. 30 units in the morning and 70 units at bedtime   Yes Historical Provider, MD  insulin lispro (HUMALOG) 100 UNIT/ML injection Inject 20 Units into the skin 3 (three) times daily.    Yes Historical Provider, MD  levothyroxine (SYNTHROID, LEVOTHROID) 25 MCG tablet Take 25 mcg by mouth every morning.    Yes Historical Provider, MD  lisinopril (PRINIVIL,ZESTRIL) 10 MG tablet Take 1 tablet by mouth every morning.  11/09/13  Yes Historical Provider, MD  metFORMIN (GLUCOPHAGE) 500 MG tablet Take 500 mg by mouth 2 (two) times daily with a meal.     Yes Historical Provider, MD  nitroGLYCERIN (NITROSTAT) 0.4 MG SL tablet Place 0.4 mg under the tongue every 5 (five) minutes as needed for chest pain.    Yes  Historical Provider, MD  pravastatin (PRAVACHOL) 20 MG tablet Take 20 mg by mouth daily.   Yes Historical Provider, MD  zolpidem (AMBIEN) 5 MG tablet Take 5 mg by mouth at bedtime. 08/12/14  Yes Historical Provider, MD   Physical Exam: Filed Vitals:   06/26/15 1825 06/26/15 1830 06/26/15 1900 06/26/15 1930  BP: 126/66 150/59 139/122 157/72  Pulse: 77 72 72 77  Temp:      TempSrc:      Resp: 18 18 19 20   Height:      Weight:  SpO2: 96% 95% 99% 98%    Wt Readings from Last 3 Encounters:  06/26/15 86.183 kg (190 lb)  11/14/14 87 kg (191 lb 12.8 oz)  09/11/14 92.987 kg (205 lb)    General:  Appears calm and comfortable Eyes: PERRL, normal lids, irises & conjunctiva ENT: grossly normal hearing, lips & tongue Neck: no LAD, masses or thyromegaly Cardiovascular: RRR, no m/r/g. No LE edema. Telemetry: SR, no arrhythmias  Respiratory: CTA bilaterally, no w/r/r. Normal respiratory effort. Abdomen: soft, ntnd Skin: no rash or induration seen on limited exam Musculoskeletal: grossly normal tone BUE/BLE Psychiatric: grossly normal mood and affect, speech fluent and appropriate Neurologic: grossly non-focal. has somewhat slurred speech but not excessively so. She has cerebellar signs mostly on the right compared to the left. Does not appear to have any weakness per se in the limbs. There is no obvious facial asymmetry.           Labs on Admission:  Basic Metabolic Panel:  Recent Labs Lab 06/26/15 1654 06/26/15 1702  NA 139 140  K 4.9 4.3  CL 105 103  CO2 27  --   GLUCOSE 341* 334*  BUN 14 15  CREATININE 0.85 0.80  CALCIUM 9.2  --    Liver Function Tests:  Recent Labs Lab 06/26/15 1654  AST 15  ALT 20  ALKPHOS 77  BILITOT 0.4  PROT 6.7  ALBUMIN 3.5   No results for input(s): LIPASE, AMYLASE in the last 168 hours. No results for input(s): AMMONIA in the last 168 hours. CBC:  Recent Labs Lab 06/26/15 1654 06/26/15 1702  WBC 11.2*  --   NEUTROABS 7.2  --     HGB 11.0* 13.3  HCT 37.7 39.0  MCV 73.9*  --   PLT 297  --    Cardiac Enzymes: No results for input(s): CKTOTAL, CKMB, CKMBINDEX, TROPONINI in the last 168 hours.  BNP (last 3 results) No results for input(s): BNP in the last 8760 hours.  ProBNP (last 3 results)  Recent Labs  08/24/14 1541 08/29/14 0732 11/13/14 1051  PROBNP 101.4 399.8 88.0    CBG: No results for input(s): GLUCAP in the last 168 hours.  Radiological Exams on Admission: Ct Head Wo Contrast  06/26/2015   CLINICAL DATA:  Dizziness, 8 days duration.  EXAM: CT HEAD WITHOUT CONTRAST  TECHNIQUE: Contiguous axial images were obtained from the base of the skull through the vertex without intravenous contrast.  COMPARISON:  07/16/2012  FINDINGS: The brain shows generalized atrophy. There chronic small-vessel ischemic changes within the hemispheric deep white matter. No sign of acute infarction, mass lesion, hemorrhage, hydrocephalus or extra-axial collection. No visible abnormality affecting the brainstem or cerebellum. No fluid in the sinuses. Small amount of fluid in the mastoid tip regions bilaterally. No calvarial abnormality otherwise. There is atherosclerotic calcification of the major vessels at the base of the brain.  IMPRESSION: Atrophy and chronic small-vessel disease of the cerebral hemispheric white matter. No acute finding or specific cause of dizziness.   Electronically Signed   By: Paulina FusiMark  Shogry M.D.   On: 06/26/2015 17:44    EKG: Independently reviewed. Sinus rhythm without any acute ST-T wave changes.  Assessment/Plan   1. Gait instability. I think she likely has had a stroke approximately a week ago and this is most likely in the right cerebellar area. We will obtain MRI brain scan, carotid Dopplers, echocardiogram. I will ask neurology consultation. 2. UTI. Treat with intravenous antibiotics. 3. Hypertension. Stable. 4. Diabetes. Continue with  home medications and sliding scale of insulin.  Further  recommendations will depend on patient's hospital progress.   Code Status: Full code.   DVT Prophylaxis: Lovenox.  Family Communication: I discussed the plan with the patient at the bedside.   Disposition Plan: Home when medically stable.   Time spent: 60 minutes.  Wilson Singer [facility] Pager [phone].

## 2015-06-27 ENCOUNTER — Observation Stay (HOSPITAL_COMMUNITY): Payer: Medicare HMO

## 2015-06-27 ENCOUNTER — Observation Stay (HOSPITAL_BASED_OUTPATIENT_CLINIC_OR_DEPARTMENT_OTHER): Payer: Medicare HMO

## 2015-06-27 DIAGNOSIS — I10 Essential (primary) hypertension: Secondary | ICD-10-CM | POA: Diagnosis not present

## 2015-06-27 DIAGNOSIS — I639 Cerebral infarction, unspecified: Secondary | ICD-10-CM

## 2015-06-27 LAB — COMPREHENSIVE METABOLIC PANEL
ALT: 17 U/L (ref 14–54)
ANION GAP: 7 (ref 5–15)
AST: 13 U/L — ABNORMAL LOW (ref 15–41)
Albumin: 3.2 g/dL — ABNORMAL LOW (ref 3.5–5.0)
Alkaline Phosphatase: 70 U/L (ref 38–126)
BUN: 13 mg/dL (ref 6–20)
CO2: 28 mmol/L (ref 22–32)
CREATININE: 0.71 mg/dL (ref 0.44–1.00)
Calcium: 8.5 mg/dL — ABNORMAL LOW (ref 8.9–10.3)
Chloride: 103 mmol/L (ref 101–111)
GFR calc Af Amer: >60 mL/min (ref >60)
Glucose, Bld: 239 mg/dL — ABNORMAL HIGH (ref 65–99)
Potassium: 4.2 mmol/L (ref 3.5–5.1)
Sodium: 138 mmol/L (ref 135–145)
Total Bilirubin: 0.4 mg/dL (ref 0.3–1.2)
Total Protein: 6.3 g/dL — ABNORMAL LOW (ref 6.5–8.1)

## 2015-06-27 LAB — GLUCOSE, CAPILLARY
GLUCOSE-CAPILLARY: 249 mg/dL — AB (ref 65–99)
GLUCOSE-CAPILLARY: 274 mg/dL — AB (ref 65–99)
GLUCOSE-CAPILLARY: 271 mg/dL — AB (ref 65–99)
Glucose-Capillary: 277 mg/dL — ABNORMAL HIGH (ref 65–99)

## 2015-06-27 LAB — CBC
HCT: 36.3 % (ref 36.0–46.0)
HEMOGLOBIN: 10.4 g/dL — AB (ref 12.0–15.0)
MCH: 21.1 pg — AB (ref 26.0–34.0)
MCHC: 28.7 g/dL — ABNORMAL LOW (ref 30.0–36.0)
MCV: 73.5 fL — ABNORMAL LOW (ref 78.0–100.0)
Platelets: 253 10*3/uL (ref 150–400)
RBC: 4.94 MIL/uL (ref 3.87–5.11)
RDW: 18.2 % — ABNORMAL HIGH (ref 11.5–15.5)
WBC: 7.9 10*3/uL (ref 4.0–10.5)

## 2015-06-27 MED ORDER — STROKE: EARLY STAGES OF RECOVERY BOOK
Freq: Once | Status: AC
Start: 2015-06-27 — End: 2015-06-27
  Administered 2015-06-27: 11:00:00
  Filled 2015-06-27: qty 1

## 2015-06-27 MED ORDER — HYDROCODONE-ACETAMINOPHEN 5-325 MG PO TABS
1.0000 | ORAL_TABLET | ORAL | Status: DC | PRN
  Administered 2015-06-27 – 2015-06-29 (×5): 1 via ORAL
  Filled 2015-06-27 (×5): qty 1

## 2015-06-27 NOTE — Progress Notes (Signed)
06/27/15-CBGs-249,274; Current DM meds:  Metformin 500 mg bid; Levemir 50 units bid; resistant correction scale tid ac and hs.Home DM meds Metformin 500 mg bid; Levemir 30 units in the am and 70 units in the pm; Humalog 20 units tid with meals.  P.O intake 50-75%  If blood sugars remain elevated, please consider adding meal coverage.  Also consider checking a Hgb A1c.  Mellissa KohutSherrie Vernel Donlan RD, CDE. M.Ed. Pager [phone] Inpatient Diabetes Coordinator

## 2015-06-27 NOTE — Care Management Note (Signed)
Case Management Note  Patient Details  Name: [name] [name] MRN: [id number] Date of Birth: 09/11/1936  Expected Discharge Date:                  Expected Discharge Plan:  Home w Home Health Services  In-House Referral:  NA  Discharge planning Services  CM Consult  Post Acute Care Choice:  Home Health Choice offered to:  Patient  DME Arranged:    DME Agency:     HH Arranged:    HH Agency:     Status of Service:  In process, will continue to follow  Medicare Important Message Given:    Date Medicare IM Given:    Medicare IM give by:    Date Additional Medicare IM Given:    Additional Medicare Important Message give by:     If discussed at Long Length of Stay Meetings, dates discussed:    Additional Comments: Pt is from home, lives with her husband and is independent at baseline. Pt has a cane and walker but doesn't use them. Pt has has HH PT in the past. PT has recommended HH PT vs OP PT at DC. Pt very drowsy today. CM to continue to follow for DC planning, HH vs OP. Pt does plan to return home with her husband.  Malcolm Metrohildress, Shaughn Thomley Demske, RN 06/27/2015, 4:17 PM

## 2015-06-27 NOTE — Evaluation (Signed)
Physical Therapy Evaluation Patient Details Name: [name] AuerRuby J [name] MRN: [id number] DOB: [birth year] Today's Date: 06/27/2015   History of Present Illness  This is a 79 year old diabetic lady who has had a stroke before and now presents with one-week history of instability of gait. She feels that she cannot walk in a straight line and is all over the place, feels that she is drunk although she has never been drunk in her life. She also has slurred speech. She does not have diplopia. There is no nausea, vomiting, headache, other visual disturbances or any specific limb weakness. She is now being admitted for further investigation and management.  Clinical Impression  Pt is received semirecumbent in bed upon entry, sleep, easy to wake, and willing to participate. No acute distress noted. Pt is A&Ox3 and pleasant. Pt reports zero falls in the last 6 months, but continued and worsening dizziness in the last 2 weeks. Pt denies any recent changes to medications or sensation of room-spinning. Smooth occular pursuits are absent c saccade dominant visual tracking. Saccades are WNL. Strength is screen by MMT as 3+/5 globally, and moderately weak as assessed by functional mobility assessment. Grip strenght is moderately weak. Pt falls risk is high as evidenced by slow gait speed, poor forward reach, and Berg Balance Test score of 33/56. Patient presenting with impairment of strength, range of motion, balance, and activity tolerance, limiting ability to perform ADL and mobility tasks at  baseline level of function. Patient will benefit from skilled intervention to address the above impairments and limitations, in order to restore to prior level of function, improve patient safety upon discharge, and to decrease falls risk.       Follow Up Recommendations Home health PT;Outpatient PT    Equipment Recommendations  Rolling walker with 5" wheels;Cane    Recommendations for Other Services       Precautions /  Restrictions Precautions Precautions: Fall Restrictions Weight Bearing Restrictions: No      Mobility  Bed Mobility Overal bed mobility: Modified Independent                Transfers Overall transfer level: Modified independent                  Ambulation/Gait Ambulation/Gait assistance: Min guard Ambulation Distance (Feet): 100 Feet Assistive device: None Gait Pattern/deviations: Step-through pattern;Staggering left;Staggering right Gait velocity: 0.538m/s Gait velocity interpretation: <1.8 ft/sec, indicative of risk for recurrent falls General Gait Details: Multiple LOB during walking, able to self correct.   Stairs            Wheelchair Mobility    Modified Rankin (Stroke Patients Only)       Balance Overall balance assessment: Needs assistance                               Standardized Balance Assessment Standardized Balance Assessment : Berg Balance Test Berg Balance Test Sit to Stand: Able to stand  independently using hands Standing Unsupported: Able to stand 2 minutes with supervision Sitting with Back Unsupported but Feet Supported on Floor or Stool: Able to sit safely and securely 2 minutes Stand to Sit: Controls descent by using hands Transfers: Able to transfer safely, definite need of hands Standing Unsupported with Eyes Closed: Able to stand 3 seconds Standing Ubsupported with Feet Together: Able to place feet together independently but unable to hold for 30 seconds From Standing, Reach Forward with Outstretched Arm: Can reach forward >  5 cm safely (2") From Standing Position, Pick up Object from Floor: Able to pick up shoe, needs supervision From Standing Position, Turn to Look Behind Over each Shoulder: Looks behind from both sides and weight shifts well Turn 360 Degrees: Needs close supervision or verbal cueing Standing Unsupported, Alternately Place Feet on Step/Stool: Able to complete >2 steps/needs minimal  assist Standing Unsupported, One Foot in Front: Needs help to step but can hold 15 seconds Standing on One Leg: Tries to lift leg/unable to hold 3 seconds but remains standing independently Total Score: 33         Pertinent Vitals/Pain Pain Assessment: 0-10 Pain Location: Does not elaborate on LBP; reported as chronic and aggravated due to prolonged time in bed.  Pain Intervention(s): Monitored during session    Home Living Family/patient expects to be discharged to:: Private residence Living Arrangements: Spouse/significant other Available Help at Discharge: Family;Available 24 hours/day (Husband, and daughter lives nearby) Type of Home: House Home Access: Stairs to enter Entrance Stairs-Rails: Can reach both;Left;Right Entrance Stairs-Number of Steps: 2 Home Layout: One level        Prior Function           Comments: Pt does report use of momentum to transfer to standing from any surface besides her lift chair.     Hand Dominance   Dominant Hand: Right    Extremity/Trunk Assessment   Upper Extremity Assessment: Generalized weakness (MMT screening reveals 3+/5 in elbow flexion/extension, and grip strength moderately weak.)           Lower Extremity Assessment: Generalized weakness (SLow adn labored transfers. )      Cervical / Trunk Assessment: Other exceptions  Communication   Communication: No difficulties  Cognition Arousal/Alertness: Lethargic Behavior During Therapy: WFL for tasks assessed/performed Overall Cognitive Status: Within Functional Limits for tasks assessed                      General Comments      Exercises        Assessment/Plan    PT Assessment Patient needs continued PT services  PT Diagnosis Difficulty walking;Abnormality of gait;Generalized weakness   PT Problem List Decreased strength;Decreased range of motion;Obesity;Decreased activity tolerance;Decreased balance;Decreased mobility  PT Treatment Interventions  Gait training;Balance training;DME instruction;Stair training;Functional mobility training;Therapeutic activities;Therapeutic exercise   PT Goals (Current goals can be found in the Care Plan section) Acute Rehab PT Goals Patient Stated Goal: Decrease dizziness and improve balance and activity tolerance.  PT Goal Formulation: With patient Time For Goal Achievement: 07/11/15 Potential to Achieve Goals: Fair    Frequency Min 3X/week   Barriers to discharge        Co-evaluation               End of Session Equipment Utilized During Treatment: Gait belt Activity Tolerance: Patient limited by fatigue;Patient limited by lethargy Patient left: in bed;with bed alarm set;with call bell/phone within reach Nurse Communication: Other (comment)         Time: 9604-5409 PT Time Calculation (min) (ACUTE ONLY): 17 min   Charges:   PT Evaluation $Initial PT Evaluation Tier I: 1 Procedure     PT G Codes:        Kaeleb Emond C Jul 27, 2015, 3:27 PM 3:32 PM  Rosamaria Lints, PT, DPT [place] License # [id number]

## 2015-06-27 NOTE — Progress Notes (Signed)
Subjective: The patient is alert and oriented this morning. States states that she still is dizzy and has slightly slurred speech. She was admitted with what was felt to have had stroke and right cerebellar area.  eObjective: Vital signs in last 24 hours: Temp:  [97.5 F (36.4 C)-98.8 F (37.1 C)] 97.5 F (36.4 C) (07/05 0438) Pulse Rate:  [63-86] 63 (07/05 0438) Resp:  [16-26] 18 (07/05 0438) BP: (121-171)/(59-130) 171/73 mmHg (07/05 0438) SpO2:  [94 %-100 %] 100 % (07/05 0438) Weight:  [86.183 kg (190 lb)] 86.183 kg (190 lb) (07/04 1619) Weight change:  Last BM Date: 06/25/15  Intake/Output from previous day: 07/04 0701 - 07/05 0700 In: -  Out: 2 [Urine:2] Intake/Output this shift: Total I/O In: -  Out: 2 [Urine:2]  Physical Exam: General appearance patient is drowsy but alert H EENT negative  Neck supple no JVD or thyroid abnormalities  Heart regular rhythm no murmurs  Lungs clear to P&A  Abdomen no palpable organs or masses  Neurological slightly slurred speech. She does not have focal abnormality no weakness in extremities  Recent Labs  06/26/15 1654 06/26/15 1702  WBC 11.2*  --   HGB 11.0* 13.3  HCT 37.7 39.0  PLT 297  --    BMET  Recent Labs  06/26/15 1654 06/26/15 1702  NA 139 140  K 4.9 4.3  CL 105 103  CO2 27  --   GLUCOSE 341* 334*  BUN 14 15  CREATININE 0.85 0.80  CALCIUM 9.2  --     Studies/Results: Ct Head Wo Contrast  06/26/2015   CLINICAL DATA:  Dizziness, 8 days duration.  EXAM: CT HEAD WITHOUT CONTRAST  TECHNIQUE: Contiguous axial images were obtained from the base of the skull through the vertex without intravenous contrast.  COMPARISON:  07/16/2012  FINDINGS: The brain shows generalized atrophy. There chronic small-vessel ischemic changes within the hemispheric deep white matter. No sign of acute infarction, mass lesion, hemorrhage, hydrocephalus or extra-axial collection. No visible abnormality affecting the brainstem or  cerebellum. No fluid in the sinuses. Small amount of fluid in the mastoid tip regions bilaterally. No calvarial abnormality otherwise. There is atherosclerotic calcification of the major vessels at the base of the brain.  IMPRESSION: Atrophy and chronic small-vessel disease of the cerebral hemispheric white matter. No acute finding or specific cause of dizziness.   Electronically Signed   By: Paulina FusiMark  Shogry M.D.   On: 06/26/2015 17:44    Medications:  . sodium chloride   Intravenous STAT  . aspirin EC  325 mg Oral Daily  . cefTRIAXone (ROCEPHIN)  IV  1 g Intravenous Q24H  . enoxaparin (LOVENOX) injection  40 mg Subcutaneous Q24H  . insulin aspart  0-20 Units Subcutaneous TID WC  . insulin aspart  0-5 Units Subcutaneous QHS  . insulin detemir  50 Units Subcutaneous BID  . levothyroxine  25 mcg Oral QAC breakfast  . lisinopril  10 mg Oral q morning - 10a  . metFORMIN  500 mg Oral BID WC  . pravastatin  20 mg Oral q1800  . sodium chloride  3 mL Intravenous Q12H  . sodium chloride  3 mL Intravenous Q12H  . zolpidem  5 mg Oral QHS        Assessment/Plan: 1. Gait instability possible recent stroke and right cerebellar area-we'll proceed with MRI brain scan carotid Dopplers 2-D echocardiogram and neurological consultation  2. Urinary tract infection continue IV Rocephin  3. Essential hypertension stable  4. Diabetes mellitus continue  to monitor continue sliding scale insulin coverage   LOS: 1 day   Deniz Eskridge G 06/27/2015, 6:04 AM

## 2015-06-28 ENCOUNTER — Telehealth (INDEPENDENT_AMBULATORY_CARE_PROVIDER_SITE_OTHER): Payer: Self-pay | Admitting: *Deleted

## 2015-06-28 DIAGNOSIS — Z1211 Encounter for screening for malignant neoplasm of colon: Secondary | ICD-10-CM

## 2015-06-28 LAB — GLUCOSE, CAPILLARY
GLUCOSE-CAPILLARY: 284 mg/dL — AB (ref 65–99)
GLUCOSE-CAPILLARY: 282 mg/dL — AB (ref 65–99)
Glucose-Capillary: 253 mg/dL — ABNORMAL HIGH (ref 65–99)
Glucose-Capillary: 191 mg/dL — ABNORMAL HIGH (ref 65–99)

## 2015-06-28 LAB — LIPID PANEL
CHOLESTEROL: 160 mg/dL (ref 0–200)
HDL: 39 mg/dL — ABNORMAL LOW (ref >40)
LDL Cholesterol: 63 mg/dL (ref 0–99)
Total CHOL/HDL Ratio: 4.1 RATIO
Triglycerides: 288 mg/dL — ABNORMAL HIGH (ref <150)
VLDL: 58 mg/dL — ABNORMAL HIGH (ref 0–40)

## 2015-06-28 MED ORDER — PEG 3350-KCL-NA BICARB-NACL 420 G PO SOLR: 4000.0000 mL | Freq: Once | ORAL | Status: DC

## 2015-06-28 NOTE — Progress Notes (Signed)
Inpatient Diabetes Program Recommendations  AACE/ADA: New Consensus Statement on Inpatient Glycemic Control (2013)  Target Ranges:  Prepandial:   less than 140 mg/dL      Peak postprandial:   less than 180 mg/dL (1-2 hours)      Critically ill patients:  140 - 180 mg/dL   Referral received due to cost of insulin.  Patient states that currently her insulin is 177$ per month.  [facility] states [facility] [place] [facility] and therefore will have to pay the full price.  She is interested in a cheaper insulin option.  MD, consider changing insulin to Novolin 70/30 to 60 units bid.  [facility].  Unfortunately, patient will still need about 4 vials a month which will be approximately 100$ per month.  May consider changing current insulin to 70/30 while patient is in the hospital in order for adjustments to be made.  Will reassess insulin needs on 06/29/15.  Thanks, Beryl MeagerJenny Tecla Mailloux, RN, BC-ADM Inpatient Diabetes Coordinator Pager [phone] (8a-5p)

## 2015-06-28 NOTE — Telephone Encounter (Signed)
Patient needs trilyte 

## 2015-06-28 NOTE — Progress Notes (Signed)
Subjective: The patient is alert and oriented today. States she still slightly dizzy. She was admitted with what was felt to have been stroke in right cerebellar area however MRI did not show acute infarct. Carotid Doppler ultrasound no evidence of stenosis of significance blood sugars are in range of 271-277  Objective: Vital signs in last 24 hours: Temp:  [98 F (36.7 C)-98.6 F (37 C)] 98.6 F (37 C) (07/06 0607) Pulse Rate:  [71-84] 84 (07/06 0607) Resp:  [17-20] 20 (07/06 0607) BP: (149-170)/(54-75) 159/75 mmHg (07/06 0607) SpO2:  [94 %-100 %] 94 % (07/06 0607) Weight:  [86.183 kg (190 lb)] 86.183 kg (190 lb) (07/05 0844) Weight change:  Last BM Date: 06/25/15  Intake/Output from previous day: 07/05 0701 - 07/06 0700 In: 960 [P.O.:960] Out: 851 [Urine:851] Intake/Output this shift: Total I/O In: 240 [P.O.:240] Out: 850 [Urine:850]  Physical Exam: Gen. appearance patient is alert and oriented  HEENT negative  Neck supple no JVD or thyroid abnormalities  Heart regular rhythm no murmurs  Lungs clear to P&A  Abdomen the palpable organs or masses  Neurological slightly slurred speech no focal abnormality we are weakness in extremities   Recent Labs  06/26/15 1654 06/26/15 1702 06/27/15 0620  WBC 11.2*  --  7.9  HGB 11.0* 13.3 10.4*  HCT 37.7 39.0 36.3  PLT 297  --  253   BMET  Recent Labs  06/26/15 1654 06/26/15 1702 06/27/15 0620  NA 139 140 138  K 4.9 4.3 4.2  CL 105 103 103  CO2 27  --  28  GLUCOSE 341* 334* 239*  BUN 14 15 13   CREATININE 0.85 0.80 0.71  CALCIUM 9.2  --  8.5*    Studies/Results: Ct Head Wo Contrast  06/26/2015   CLINICAL DATA:  Dizziness, 8 days duration.  EXAM: CT HEAD WITHOUT CONTRAST  TECHNIQUE: Contiguous axial images were obtained from the base of the skull through the vertex without intravenous contrast.  COMPARISON:  07/16/2012  FINDINGS: The brain shows generalized atrophy. There chronic small-vessel ischemic changes  within the hemispheric deep white matter. No sign of acute infarction, mass lesion, hemorrhage, hydrocephalus or extra-axial collection. No visible abnormality affecting the brainstem or cerebellum. No fluid in the sinuses. Small amount of fluid in the mastoid tip regions bilaterally. No calvarial abnormality otherwise. There is atherosclerotic calcification of the major vessels at the base of the brain.  IMPRESSION: Atrophy and chronic small-vessel disease of the cerebral hemispheric white matter. No acute finding or specific cause of dizziness.   Electronically Signed   By: Paulina FusiMark  Shogry M.D.   On: 06/26/2015 17:44   [name] Contrast  06/27/2015   CLINICAL DATA:  79 year old female with dizziness and slurred speech. History of prior stroke. Subsequent encounter.  EXAM: MRI HEAD WITHOUT CONTRAST  TECHNIQUE: Multiplanar, multiecho pulse sequences of the brain and surrounding structures were obtained without intravenous contrast.  COMPARISON:  06/26/2015 head CT.  07/16/2012 brain MR.  FINDINGS: Exam is motion degraded.  No acute infarct.  Prominent small vessel disease type changes.  No intracranial hemorrhage.  No intracranial mass lesion noted on this unenhanced exam.  Global atrophy without hydrocephalus.  Major intracranial vascular structures are patent.  Exophthalmos.  Post lens replacement.  Cervical medullary junction and pineal region unremarkable.  Partially empty sella.  Partial opacification inferior aspect mastoid air cells without obstructing lesion of the eustachian tube noted. Mild mucosal thickening frontal sinuses and ethmoid sinus air cells.  IMPRESSION: Exam is motion  degraded.  No acute infarct.  Prominent small vessel disease type changes.  Global atrophy.  Partial opacification inferior aspect mastoid air cells. Mild mucosal thickening frontal sinuses and ethmoid sinus air cells.   Electronically Signed   By: Lacy Duverney M.D.   On: 06/27/2015 09:31   US Carotid Bilateral  06/27/2015    CLINICAL DATA:  Hypertension, stroke symptoms, hyperlipidemia and diabetes.  EXAM: BILATERAL CAROTID DUPLEX ULTRASOUND  TECHNIQUE: Wallace Cullens scale imaging, color Doppler and duplex ultrasound were performed of bilateral carotid and vertebral arteries in the neck.  COMPARISON:  06/27/2015 MRI  FINDINGS: Criteria: Quantification of carotid stenosis is based on velocity parameters that correlate the residual internal carotid diameter with NASCET-based stenosis levels, using the diameter of the distal internal carotid lumen as the denominator for stenosis measurement.  The following velocity measurements were obtained:  RIGHT  ICA:  111/17 cm/sec  CCA:  94/12 cm/sec  SYSTOLIC ICA/CCA RATIO:  1.2  DIASTOLIC ICA/CCA RATIO:  1.4  ECA:  132 cm/sec  LEFT  ICA:  122/27 cm/sec  CCA:  116/20 cm/sec  SYSTOLIC ICA/CCA RATIO:  1.1  DIASTOLIC ICA/CCA RATIO:  1.4  ECA:  100 cm/sec  RIGHT CAROTID ARTERY: Minor echogenic shadowing plaque formation. No hemodynamically significant right ICA stenosis, velocity elevation, or turbulent flow. Degree of narrowing less than 50%.  RIGHT VERTEBRAL ARTERY:  Antegrade  LEFT CAROTID ARTERY: Similar scattered minor echogenic plaque formation. No hemodynamically significant left ICA stenosis, velocity elevation, or turbulent flow.  LEFT VERTEBRAL ARTERY:  Antegrade  IMPRESSION: Minor carotid atherosclerosis. No hemodynamically significant ICA stenosis by ultrasound. Degree of narrowing less than 50% bilaterally.   Electronically Signed   By: Judie Petit.  Shick M.D.   On: 06/27/2015 16:20    Medications:  . aspirin EC  325 mg Oral Daily  . cefTRIAXone (ROCEPHIN)  IV  1 g Intravenous Q24H  . enoxaparin (LOVENOX) injection  40 mg Subcutaneous Q24H  . insulin aspart  0-20 Units Subcutaneous TID WC  . insulin aspart  0-5 Units Subcutaneous QHS  . insulin detemir  50 Units Subcutaneous BID  . levothyroxine  25 mcg Oral QAC breakfast  . lisinopril  10 mg Oral q morning - 10a  . metFORMIN  500 mg Oral BID WC   . pravastatin  20 mg Oral q1800  . sodium chloride  3 mL Intravenous Q12H  . sodium chloride  3 mL Intravenous Q12H  . zolpidem  5 mg Oral QHS        Assessment/Plan: 1. Gait instability-MRI of brain did not show evidence of acute stroke-carotid Dopplers no significant stenosis to obtain report of 2-D echo and neurological consult  2. Urinary tract infection continue IV Rocephin  3. Essential hypertension stable  4. Diabetes mellitus continue to monitor continue sliding scale insulin coverage and basal insulin coverage   LOS: 2 days   Jamira Barfuss G 06/28/2015, 6:30 AM

## 2015-06-28 NOTE — Progress Notes (Signed)
Physical Therapy Treatment Patient Details Name: [name] [name] MRN: [id number] DOB: Apr 22, 1936 Today's Date: 06/28/2015    History of Present Illness      PT Comments    Pt pleasant to work with and eager to complete therapy today.  Gait training complete with no AD per pt. request with min guard due to staggered gait Rt and Lt, pt able to recover LOB episodes without assistance required.  Seated exercises complete on rest breaks for LE strengthening.  No reports of pain through session, pt was limited by fatigue at end of session.  Pt left in chair with call bell within reach and chair alarm set.  Next session focus on more dynamic balance activities towards PT POC.  Follow Up Recommendations        Equipment Recommendations       Recommendations for Other Services       Precautions / Restrictions Precautions Precautions: Fall Restrictions Weight Bearing Restrictions: No    Mobility  Bed Mobility Overal bed mobility: Independent                Transfers Overall transfer level: Independent                  Ambulation/Gait Ambulation/Gait assistance: Supervision Ambulation Distance (Feet): 420 Feet (2 sets 120 feet, rest break then 300 feet) Assistive device: None Gait Pattern/deviations: Staggering left;Staggering right;Step-through pattern   Gait velocity interpretation: <1.8 ft/sec, indicative of risk for recurrent falls General Gait Details: Multiple LOB during walking, able to self correct.    Stairs            Wheelchair Mobility    Modified Rankin (Stroke Patients Only)       Balance                                    Cognition Arousal/Alertness: Awake/alert Behavior During Therapy: WFL for tasks assessed/performed Overall Cognitive Status: Within Functional Limits for tasks assessed                      Exercises General Exercises - Lower Extremity Ankle Circles/Pumps: AROM;Both;10 reps;Seated Long  Arc Quad: AROM;Both;10 reps;Seated Heel Slides: AROM;Both;10 reps;Seated Hip ABduction/ADduction: AROM;10 reps;Seated Hip Flexion/Marching: AROM;Both;10 reps;Seated    General Comments        Pertinent Vitals/Pain Pain Assessment: 0-10 Pain Score: 6  Pain Location: Arthritic pain in lower, hip and general joints Pain Descriptors / Indicators: Aching;Sore    Home Living                      Prior Function            PT Goals (current goals can now be found in the care plan section) Progress towards PT goals: Progressing toward goals    Frequency       PT Plan Current plan remains appropriate    Co-evaluation             End of Session Equipment Utilized During Treatment: Gait belt Activity Tolerance: Patient limited by fatigue;Patient tolerated treatment well Patient left: in chair;with call bell/phone within reach;with chair alarm set     Time: [id number] PT Time Calculation (min) (ACUTE ONLY): 23 min  Charges:  $Gait Training: 8-22 mins $Therapeutic Exercise: 8-22 mins                    G  Codes:      Juel Burrow 06/28/2015, 5:05 PM

## 2015-06-28 NOTE — Care Management Note (Signed)
Case Management Note  Patient Details  Name: Lise AuerRuby J [place] MRN: [id number] Date of Birth: 10-15-1936  Expected Discharge Date:                  Expected Discharge Plan:  Home/Self Care  In-House Referral:  NA  Discharge planning Services  CM Consult  Post Acute Care Choice:  Home Health Choice offered to:  Patient  DME Arranged:    DME Agency:     HH Arranged:    HH Agency:     Status of Service:  In process, will continue to follow  Medicare Important Message Given:    Date Medicare IM Given:    Medicare IM give by:    Date Additional Medicare IM Given:    Additional Medicare Important Message give by:     If discussed at Long Length of Stay Meetings, dates discussed:    Additional Comments: PT recommended HH PT vs OP PT on 06/27/2015. Pt much more alert today, has been up walking in room and does not feel she needs PT. Pt says she has a walker if she feels she needs it. Pt says she needs care in the home but makes too much money for assistance. Pt offered list of [employer] caregivers but pt says she is unable to pay privately. She says she has almost no money after paying for her insulins. Consult ordered for diabetes coordinator to assess for a way to control her DM in a more cost efficient way. Pt plans to DC in 1-2 days. Will cont to follow.   Malcolm Metrohildress, Lakethia Coppess Demske, RN 06/28/2015, 1:04 PM

## 2015-06-29 LAB — GLUCOSE, CAPILLARY: Glucose-Capillary: 246 mg/dL — ABNORMAL HIGH (ref 65–99)

## 2015-06-29 LAB — URINE CULTURE: Special Requests: NORMAL

## 2015-06-29 NOTE — Progress Notes (Signed)
Patient discharged home.  IV removed - WNL.  Reviewed medicatons - no changes made.  Follow up in place with PCP.  No questions at this time. Stable to DC home, assisted off unit via WC by staff

## 2015-06-29 NOTE — Care Management Note (Signed)
Case Management Note  Patient Details  Name: Lise AuerRuby J Stumpp MRN: [id number] Date of Birth: 12-Nov-1936  Expected Discharge Date:                  Expected Discharge Plan:  Home/Self Care  In-House Referral:  NA  Discharge planning Services  CM Consult  Post Acute Care Choice:  Home Health Choice offered to:  Patient  DME Arranged:    DME Agency:     HH Arranged:    HH Agency:     Status of Service:  Completed, signed off  Medicare Important Message Given:    Date Medicare IM Given:    Medicare IM give by:    Date Additional Medicare IM Given:    Additional Medicare Important Message give by:     If discussed at Long Length of Stay Meetings, dates discussed:    Additional Comments: Patient discharging home today with self care. Pt has refused PT, says she still feels dizzy but it's nothing new and her strength is good. Pt concerned about Dr Renard MatterMcinnis getting the diabetes coordinator's insulin recommendations. CM to fax recommendations to MD office. No further CM needs.  Malcolm Metrohildress, Ajai Harville Demske, RN 06/29/2015, 10:05 AM

## 2015-06-29 NOTE — Care Management (Signed)
Important Message  Patient Details  Name: Lise AuerRuby J Mak MRN: [id number] Date of Birth: 1936-11-12   Medicare Important Message Given:  Yes-second notification given    Malcolm MetroChildress, Akil Hoos Demske, RN 06/29/2015, 10:07 AM

## 2015-06-29 NOTE — Discharge Summary (Cosign Needed)
Physician Discharge Summary  [name] [name] ZOX:[id number] DOB: Mar 15, 1936 DOA: 06/26/2015  PCP: Alice Reichert, MD  Admit date: 06/26/2015 Discharge date: 06/29/2015     Discharge Diagnoses:  1. Gait instability prior lacunar infarct thalamus 2.  3. 2 urinary tract infection 4.  5. 3. Hypertension essential 6.  7. 4 diabetes mellitus with hyperglycemia 8.  9. Psoriasis 10.  11. History coronary artery disease with stent placement angioplasty prior to admission  Discharge Condition: Stable Disposition: Home  Diet recommendation: 2000-calorie ADA diet  Filed Weights   06/26/15 1619 06/29/15 0534  Weight: 86.183 kg (190 lb) 90.674 kg (199 lb 14.4 oz)    History of present illness:  The patient presented to the hospital with gait instability. She has a diabetic. She's had some slurred speech. Her sugars have been running quite high. She had a remote stroke lacunar infarct thalamus on the right is been some years back. She was admitted for further evaluation  Hospital Course:  Examination on admission the patient is, comfortable female. Heart regular rhythm, lungs clear to P&A, neurological slurred speech but no weakness in extremities no facial asymmetry. Patient did have CT of head and MRI of head on admission both showed evidence of cortical atrophy but no acute infarction. The patient did have carotid Doppler ultrasound which showed less than 50% stenosis of carotid system. She i is diabetic and runs high blood sugars her sugars range from 191-253. She remained on sliding scale insulin. She did have a urinary tract infection which was treated with IV Rocephin. Towards the latter part of hospitalization the patient did have help with ambulation by therapist. It was felt that she did not have an acute stroke on this occasion. She is bothered by chronic low back pain. It was felt she could be discharged on the medicines listed below and at least for now to continue Lantus insulin 70  units a.m. 30 units p.m. and NovoLog by sliding scale   Discharge Instructions The patient is to continue medications listed below. She is to continue Lantus insulin 70 units in the morning and 30 units in the evening and sliding scale NovoLog insulin. She will schedule a fall with primary care physician within the week    Medication List    TAKE these medications        albuterol (2.5 MG/3ML) 0.083% nebulizer solution  Commonly known as:  PROVENTIL  Take 3 mLs (2.5 mg total) by nebulization every 6 (six) hours as needed for wheezing or shortness of breath.     aspirin EC 325 MG tablet  Take 325 mg by mouth daily.     diazepam 5 MG tablet  Commonly known as:  VALIUM  Take 5 mg by mouth every 6 (six) hours as needed for anxiety.     insulin lispro 100 UNIT/ML injection  Commonly known as:  HUMALOG  Inject 20 Units into the skin 3 (three) times daily.     LEVEMIR 100 UNIT/ML injection  Generic drug:  insulin detemir  Inject 30-70 Units into the skin 2 (two) times daily. 30 units in the morning and 70 units at bedtime     levothyroxine 25 MCG tablet  Commonly known as:  SYNTHROID, LEVOTHROID  Take 25 mcg by mouth every morning.     lisinopril 10 MG tablet  Commonly known as:  PRINIVIL,ZESTRIL  Take 1 tablet by mouth every morning.     metFORMIN 500 MG tablet  Commonly known as:  GLUCOPHAGE  Take 500  mg by mouth 2 (two) times daily with a meal.     nitroGLYCERIN 0.4 MG SL tablet  Commonly known as:  NITROSTAT  Place 0.4 mg under the tongue every 5 (five) minutes as needed for chest pain.     polyethylene glycol-electrolytes 420 G solution  Commonly known as:  NuLYTELY/GoLYTELY  Take 4,000 mLs by mouth once.     pravastatin 20 MG tablet  Commonly known as:  PRAVACHOL  Take 20 mg by mouth daily.     zolpidem 5 MG tablet  Commonly known as:  AMBIEN  Take 5 mg by mouth at bedtime.       Allergies  Allergen Reactions  . Codeine Itching    The results of  significant diagnostics from this hospitalization (including imaging, microbiology, ancillary and laboratory) are listed below for reference.    Significant Diagnostic Studies: Ct Head Wo Contrast  06/26/2015   CLINICAL DATA:  Dizziness, 8 days duration.  EXAM: CT HEAD WITHOUT CONTRAST  TECHNIQUE: Contiguous axial images were obtained from the base of the skull through the vertex without intravenous contrast.  COMPARISON:  07/16/2012  FINDINGS: The brain shows generalized atrophy. There chronic small-vessel ischemic changes within the hemispheric deep white matter. No sign of acute infarction, mass lesion, hemorrhage, hydrocephalus or extra-axial collection. No visible abnormality affecting the brainstem or cerebellum. No fluid in the sinuses. Small amount of fluid in the mastoid tip regions bilaterally. No calvarial abnormality otherwise. There is atherosclerotic calcification of the major vessels at the base of the brain.  IMPRESSION: Atrophy and chronic small-vessel disease of the cerebral hemispheric white matter. No acute finding or specific cause of dizziness.   Electronically Signed   By: Paulina Fusi M.D.   On: 06/26/2015 17:44   [name] Contrast  06/27/2015   CLINICAL DATA:  79 year old female with dizziness and slurred speech. History of prior stroke. Subsequent encounter.  EXAM: MRI HEAD WITHOUT CONTRAST  TECHNIQUE: Multiplanar, multiecho pulse sequences of the brain and surrounding structures were obtained without intravenous contrast.  COMPARISON:  06/26/2015 head CT.  07/16/2012 brain MR.  FINDINGS: Exam is motion degraded.  No acute infarct.  Prominent small vessel disease type changes.  No intracranial hemorrhage.  No intracranial mass lesion noted on this unenhanced exam.  Global atrophy without hydrocephalus.  Major intracranial vascular structures are patent.  Exophthalmos.  Post lens replacement.  Cervical medullary junction and pineal region unremarkable.  Partially empty sella.   Partial opacification inferior aspect mastoid air cells without obstructing lesion of the eustachian tube noted. Mild mucosal thickening frontal sinuses and ethmoid sinus air cells.  IMPRESSION: Exam is motion degraded.  No acute infarct.  Prominent small vessel disease type changes.  Global atrophy.  Partial opacification inferior aspect mastoid air cells. Mild mucosal thickening frontal sinuses and ethmoid sinus air cells.   Electronically Signed   By: Lacy Duverney M.D.   On: 06/27/2015 09:31   US Carotid Bilateral  06/27/2015   CLINICAL DATA:  Hypertension, stroke symptoms, hyperlipidemia and diabetes.  EXAM: BILATERAL CAROTID DUPLEX ULTRASOUND  TECHNIQUE: Wallace Cullens scale imaging, color Doppler and duplex ultrasound were performed of bilateral carotid and vertebral arteries in the neck.  COMPARISON:  06/27/2015 MRI  FINDINGS: Criteria: Quantification of carotid stenosis is based on velocity parameters that correlate the residual internal carotid diameter with NASCET-based stenosis levels, using the diameter of the distal internal carotid lumen as the denominator for stenosis measurement.  The following velocity measurements were obtained:  RIGHT  ICA:  111/17 cm/sec  CCA:  94/12 cm/sec  SYSTOLIC ICA/CCA RATIO:  1.2  DIASTOLIC ICA/CCA RATIO:  1.4  ECA:  132 cm/sec  LEFT  ICA:  122/27 cm/sec  CCA:  116/20 cm/sec  SYSTOLIC ICA/CCA RATIO:  1.1  DIASTOLIC ICA/CCA RATIO:  1.4  ECA:  100 cm/sec  RIGHT CAROTID ARTERY: Minor echogenic shadowing plaque formation. No hemodynamically significant right ICA stenosis, velocity elevation, or turbulent flow. Degree of narrowing less than 50%.  RIGHT VERTEBRAL ARTERY:  Antegrade  LEFT CAROTID ARTERY: Similar scattered minor echogenic plaque formation. No hemodynamically significant left ICA stenosis, velocity elevation, or turbulent flow.  LEFT VERTEBRAL ARTERY:  Antegrade  IMPRESSION: Minor carotid atherosclerosis. No hemodynamically significant ICA stenosis by ultrasound. Degree  of narrowing less than 50% bilaterally.   Electronically Signed   By: Judie PetitM.  Shick M.D.   On: 06/27/2015 16:20    Microbiology: Recent Results (from the past 240 hour(s))  Urine culture     Status: None (Preliminary result)   Collection Time: 06/26/15  5:05 PM  Result Value Ref Range Status   Specimen Description URINE, CLEAN CATCH  Final   Special Requests Normal  Final   Culture   Final    >=100,000 COLONIES/mL GRAM NEGATIVE RODS [facility]    Report Status PENDING  Incomplete     Labs: Basic Metabolic Panel:  Recent Labs Lab 06/26/15 1654 06/26/15 1702 06/27/15 0620  NA 139 140 138  K 4.9 4.3 4.2  CL 105 103 103  CO2 27  --  28  GLUCOSE 341* 334* 239*  BUN 14 15 13   CREATININE 0.85 0.80 0.71  CALCIUM 9.2  --  8.5*   Liver Function Tests:  Recent Labs Lab 06/26/15 1654 06/27/15 0620  AST 15 13*  ALT 20 17  ALKPHOS 77 70  BILITOT 0.4 0.4  PROT 6.7 6.3*  ALBUMIN 3.5 3.2*   No results for input(s): LIPASE, AMYLASE in the last 168 hours. No results for input(s): AMMONIA in the last 168 hours. CBC:  Recent Labs Lab 06/26/15 1654 06/26/15 1702 06/27/15 0620  WBC 11.2*  --  7.9  NEUTROABS 7.2  --   --   HGB 11.0* 13.3 10.4*  HCT 37.7 39.0 36.3  MCV 73.9*  --  73.5*  PLT 297  --  253   Cardiac Enzymes: No results for input(s): CKTOTAL, CKMB, CKMBINDEX, TROPONINI in the last 168 hours. BNP: BNP (last 3 results) No results for input(s): BNP in the last 8760 hours.  ProBNP (last 3 results)  Recent Labs  08/24/14 1541 08/29/14 0732 11/13/14 1051  PROBNP 101.4 399.8 88.0    CBG:  Recent Labs Lab 06/27/15 2049 06/28/15 0737 06/28/15 1120 06/28/15 1711 06/28/15 2055  GLUCAP 271* 253* 284* 191* 282*    Active Problems:   DM type 2 causing complication   Essential hypertension   Cerebrovascular disease   UTI (lower urinary tract infection)   Gait instability   Time coordinating discharge: 45  minutes  Signed:  Butch PennyAngus Sussan Meter, MD 06/29/2015, 6:15 AM

## 2015-07-04 ENCOUNTER — Ambulatory Visit (HOSPITAL_COMMUNITY)
Admission: RE | Admit: 2015-07-04 | Discharge: 2015-07-04 | Disposition: A | Discharge status: Home / Self Care | Payer: Medicare HMO | Source: Ambulatory Visit | Attending: Family Medicine | Admitting: Family Medicine

## 2015-07-04 ENCOUNTER — Other Ambulatory Visit (HOSPITAL_COMMUNITY): Payer: Self-pay | Admitting: Family Medicine

## 2015-07-04 DIAGNOSIS — W19XXXA Unspecified fall, initial encounter: Secondary | ICD-10-CM | POA: Insufficient documentation

## 2015-07-04 DIAGNOSIS — M8588 Other specified disorders of bone density and structure, other site: Secondary | ICD-10-CM | POA: Diagnosis not present

## 2015-07-04 DIAGNOSIS — M545 Low back pain: Secondary | ICD-10-CM | POA: Insufficient documentation

## 2015-07-10 ENCOUNTER — Telehealth (INDEPENDENT_AMBULATORY_CARE_PROVIDER_SITE_OTHER): Payer: Self-pay | Admitting: *Deleted

## 2015-07-10 NOTE — Telephone Encounter (Signed)
Referring MD/PCP: mcinnis   Procedure: tcs  Reason/Indication:  Hx polyps, fam hx colon ca  Has patient had this procedure before?  Yes, 2011 -- paper chart  If so, when, by whom and where?    Is there a family history of colon cancer?  Yes, brother  Who?  What age when diagnosed?    Is patient diabetic?   yes      Does patient have prosthetic heart valve?  no  Do you have a pacemaker?  no  Has patient ever had endocarditis? no  Has patient had joint replacement within last 12 months?  no  Does patient tend to be constipated or take laxatives? no  Is patient on Coumadin, Plavix and/or Aspirin? Yes  Medications: asa 81 mg daily, lisinopril 10 mg daily, synthroid 25 mg daily, metformin 500 mg bid, levemir 30 units in am and 70 units in pm, novolog sliding scale, diazepam 5 mg prn, tylenol prn  Allergies: codeine  Medication Adjustment: asa 2 days, hold metformin evening before & morning of, decrease levemir to 20 units in am & 30 units in pm days before  Procedure date & time: 07/26/15 at 1200

## 2015-07-12 NOTE — Telephone Encounter (Signed)
agree

## 2015-07-17 ENCOUNTER — Other Ambulatory Visit (HOSPITAL_COMMUNITY): Payer: Self-pay | Admitting: Family Medicine

## 2015-07-17 DIAGNOSIS — G459 Transient cerebral ischemic attack, unspecified: Secondary | ICD-10-CM

## 2015-07-18 ENCOUNTER — Ambulatory Visit (HOSPITAL_COMMUNITY)
Admission: RE | Admit: 2015-07-18 | Discharge: 2015-07-18 | Disposition: A | Discharge status: Home / Self Care | Payer: Medicare HMO | Source: Ambulatory Visit | Attending: Family Medicine | Admitting: Family Medicine

## 2015-07-18 DIAGNOSIS — G459 Transient cerebral ischemic attack, unspecified: Secondary | ICD-10-CM

## 2015-07-18 DIAGNOSIS — R42 Dizziness and giddiness: Secondary | ICD-10-CM | POA: Diagnosis present

## 2015-07-20 ENCOUNTER — Ambulatory Visit (HOSPITAL_COMMUNITY)
Admission: RE | Admit: 2015-07-20 | Discharge: 2015-07-20 | Disposition: A | Discharge status: Home / Self Care | Payer: Medicare HMO | Source: Ambulatory Visit | Attending: Family Medicine | Admitting: Family Medicine

## 2015-07-26 ENCOUNTER — Encounter (HOSPITAL_COMMUNITY)
Admission: RE | Disposition: A | Discharge status: Home / Self Care | Payer: Self-pay | Source: Ambulatory Visit | Attending: Internal Medicine

## 2015-07-26 ENCOUNTER — Ambulatory Visit (HOSPITAL_COMMUNITY)
Admission: RE | Admit: 2015-07-26 | Discharge: 2015-07-26 | Disposition: A | Discharge status: Home / Self Care | Payer: Medicare HMO | Source: Ambulatory Visit | Attending: Internal Medicine | Admitting: Internal Medicine

## 2015-07-26 ENCOUNTER — Encounter (HOSPITAL_COMMUNITY): Payer: Self-pay | Admitting: *Deleted

## 2015-07-26 DIAGNOSIS — Z794 Long term (current) use of insulin: Secondary | ICD-10-CM | POA: Diagnosis not present

## 2015-07-26 DIAGNOSIS — Z8371 Family history of colonic polyps: Secondary | ICD-10-CM | POA: Insufficient documentation

## 2015-07-26 DIAGNOSIS — Z951 Presence of aortocoronary bypass graft: Secondary | ICD-10-CM | POA: Diagnosis not present

## 2015-07-26 DIAGNOSIS — K644 Residual hemorrhoidal skin tags: Secondary | ICD-10-CM | POA: Insufficient documentation

## 2015-07-26 DIAGNOSIS — E119 Type 2 diabetes mellitus without complications: Secondary | ICD-10-CM | POA: Insufficient documentation

## 2015-07-26 DIAGNOSIS — Z79899 Other long term (current) drug therapy: Secondary | ICD-10-CM | POA: Diagnosis not present

## 2015-07-26 DIAGNOSIS — E785 Hyperlipidemia, unspecified: Secondary | ICD-10-CM | POA: Insufficient documentation

## 2015-07-26 DIAGNOSIS — Z955 Presence of coronary angioplasty implant and graft: Secondary | ICD-10-CM | POA: Insufficient documentation

## 2015-07-26 DIAGNOSIS — Z7982 Long term (current) use of aspirin: Secondary | ICD-10-CM | POA: Diagnosis not present

## 2015-07-26 DIAGNOSIS — Z800 Family history of malignant neoplasm of digestive organs: Secondary | ICD-10-CM

## 2015-07-26 DIAGNOSIS — K648 Other hemorrhoids: Secondary | ICD-10-CM | POA: Diagnosis not present

## 2015-07-26 DIAGNOSIS — E039 Hypothyroidism, unspecified: Secondary | ICD-10-CM | POA: Diagnosis not present

## 2015-07-26 DIAGNOSIS — Z860100 Personal history of colon polyps, unspecified: Secondary | ICD-10-CM

## 2015-07-26 DIAGNOSIS — I10 Essential (primary) hypertension: Secondary | ICD-10-CM | POA: Diagnosis not present

## 2015-07-26 DIAGNOSIS — Z1211 Encounter for screening for malignant neoplasm of colon: Secondary | ICD-10-CM | POA: Diagnosis present

## 2015-07-26 DIAGNOSIS — I2510 Atherosclerotic heart disease of native coronary artery without angina pectoris: Secondary | ICD-10-CM | POA: Diagnosis not present

## 2015-07-26 DIAGNOSIS — G4733 Obstructive sleep apnea (adult) (pediatric): Secondary | ICD-10-CM | POA: Diagnosis not present

## 2015-07-26 HISTORY — PX: COLONOSCOPY: SHX5424

## 2015-07-26 LAB — GLUCOSE, CAPILLARY: Glucose-Capillary: 90 mg/dL (ref 65–99)

## 2015-07-26 SURGERY — COLONOSCOPY
Anesthesia: Moderate Sedation

## 2015-07-26 MED ORDER — MEPERIDINE HCL 50 MG/ML IJ SOLN
INTRAMUSCULAR | Status: AC
  Filled 2015-07-26: qty 1

## 2015-07-26 MED ORDER — MIDAZOLAM HCL 5 MG/5ML IJ SOLN
INTRAMUSCULAR | Status: DC | PRN
  Administered 2015-07-26: 1 mg via INTRAVENOUS
  Administered 2015-07-26: 2 mg via INTRAVENOUS
  Administered 2015-07-26: 1 mg via INTRAVENOUS
  Administered 2015-07-26: 2 mg via INTRAVENOUS

## 2015-07-26 MED ORDER — SODIUM CHLORIDE 0.9 % IV SOLN
INTRAVENOUS | Status: DC
Start: 2015-07-26 — End: 2015-07-26
  Administered 2015-07-26: 11:00:00 via INTRAVENOUS

## 2015-07-26 MED ORDER — STERILE WATER FOR IRRIGATION IR SOLN
Status: DC | PRN
  Administered 2015-07-26: 12:00:00

## 2015-07-26 MED ORDER — MEPERIDINE HCL 50 MG/ML IJ SOLN
INTRAMUSCULAR | Status: DC | PRN
  Administered 2015-07-26 (×2): 25 mg via INTRAVENOUS

## 2015-07-26 MED ORDER — MIDAZOLAM HCL 5 MG/5ML IJ SOLN
INTRAMUSCULAR | Status: AC
  Filled 2015-07-26: qty 10

## 2015-07-26 NOTE — H&P (Signed)
[name] [name] is an 79 y.o. female.   Chief Complaint:  Patient is here for colonoscopy. HPI:  Patient is 79 year old Caucasian female with history of colonic polyps and is here for surveillance colonoscopy. She denies abdominal pain or rectal bleeding. She has intermittent urgency and at times she has difficult defecation. Family history significant for colonic polyps in her brother she believes her first cousin has been treated for CRC.  Past Medical History  Diagnosis Date  . Arteriosclerotic cardiovascular disease (ASCVD) CARDIOLOGIST-   DR Dietrich Pates     CABG surgery in 2001; nl stress nuclear-2007; angiography in 01/2006- TO of LAD; 70% ostial stenosis      of a small ramus; patent grafts; normal EF; cath in 12/2009 essentially unchanged; EF-66%;  Congestive heart failure with preserved LV systolic function  . Hypertension   . Varicose veins   . Hypothyroidism   . Insulin dependent type 2 diabetes mellitus   . Peripheral neuropathy   . Diastolic CHF   . Hyperlipidemia   . History of CVA (cerebrovascular accident)     2013--  LACUNAR INFARCTIONS RIGHT THALAMUS---  residual left side of  lip numb  and left eye vision worse (which corrented lens implant from cataract extraction)  . Lesion of bladder   . Hematuria   . Arthritis   . DDD (degenerative disc disease), lumbar   . OSA (obstructive sleep apnea)     NON-COMPLIANT CPAP  . Chronic obstructive pulmonary disease     Chronic bronchitis  . History of MI (myocardial infarction)     07/ 2001   s/p  cabg x2  . S/P CABG x 2     07-02-2000  . SUI (stress urinary incontinence, female)   . Lower urinary tract symptoms (LUTS)   . Psoriasis     lower leg  . Thinning of skin     Past Surgical History  Procedure Laterality Date  . Rotator cuff repair Right 1995  . Lumbar spine surgery  x2    yrs ago  . Carpal tunnel release Bilateral yrs ago  . Orif humerus fracture Right 04-18-2008  . Knee arthroscopy Left 10-18-2003   SYNOVECTOMY  . Repair flexor tendon hand Right 09-20-2008    CARPI RADIALIS TENDON TRANSFER TO EXTENSOR TO INDEX, MIDDLE, RING , LITTLE FINGERS AND DIGITI MININI  . Cardiovascular stress test  08-28-2011   DR ROTHBART    NEGATIVE NUCLEAR STUDY/  GLOBAL LVSF/  EF 65%  . Cholecystectomy  1990  . Appendectomy  age 27  . Total abdominal hysterectomy w/ bilateral salpingoophorectomy  age 40  . Cataract extraction w/ intraocular lens  implant, bilateral  2013  . Cardiac catheterization  1991    No sig. cad  . Coronary angioplasty  11-13-1999    balloon angioplasty to mLAD , d2 of LAD  . Coronary angioplasty with stent placement  01-17-2000    PCI stenting to mLAD & D2 of LAD  . Cardiac catheterization  07-01-2000    high grade in-stent restenosis  . Cardiac catheterization  04-30-2001    single native vessel cad/ graft patent  . Cardiac catheterization  02-13-2006  dr Juanda Chance    2V cad with total occluded LAD  and  70% ostial of small ramus/  grafts patent/  normal ef  . Cardiac catheterization  01-20-2000  dr Clifton James    essentially unchanged;  severe single vessel cad with diffuse disease throughout CFX and RCA/ ef 66%;  chf with preserved LVSF  .  Coronary artery bypass graft  07-02-2000   DR Tressie Stalker    SVG to diagonal, LIMA to LAD  . Transthoracic echocardiogram  12-12-2013    MILD LVH/  EF 60-65%/  GRADE I DIASTOLIC DYSFUNCTION/  MILD LAE  . Cystoscopy w/ retrogrades Bilateral 08/09/2014    Procedure: CYSTOSCOPY, BILATERAL RETROGRADE, HYDRODISTENSION, BLADDER BIOPSY WITH FULGERATION, INSTILL PYRIDIUM AND MARCAINE ;  Surgeon: Anner Crete, MD;  Location: [facility];  Service: Urology;  Laterality: Bilateral;  . Abdominal hysterectomy      Family History  Problem Relation Age of Onset  . Coronary artery disease      Multiple first and second-degree relatives  . Aneurysm      Cerebral circulation  . Hypertension Mother   . Colon cancer Sister   . Clotting  disorder      Children diagnosed with hypercoagulable state   Social History:  reports that she has never smoked. She has never used smokeless tobacco. She reports that she does not drink alcohol or use illicit drugs.  Allergies:  Allergies  Allergen Reactions  . Codeine Itching    Medications Prior to Admission  Medication Sig Dispense Refill  . aspirin EC 325 MG tablet Take 325 mg by mouth daily.    . diazepam (VALIUM) 5 MG tablet Take 5 mg by mouth every 6 (six) hours as needed for anxiety.    . insulin detemir (LEVEMIR) 100 UNIT/ML injection Inject 30-70 Units into the skin 2 (two) times daily. 30 units in the morning and 70 units at bedtime    . insulin lispro (HUMALOG) 100 UNIT/ML injection Inject 20 Units into the skin 3 (three) times daily.     [name] levothyroxine (SYNTHROID, LEVOTHROID) 25 MCG tablet Take 25 mcg by mouth every morning.     [name] lisinopril (PRINIVIL,ZESTRIL) 10 MG tablet Take 1 tablet by mouth every morning.     . metFORMIN (GLUCOPHAGE) 500 MG tablet Take 500 mg by mouth 2 (two) times daily with a meal.      . Multiple Vitamins-Calcium (ONE-A-DAY WOMENS PO) Take 1 tablet by mouth daily.    . polyethylene glycol-electrolytes (NULYTELY/GOLYTELY) 420 G solution Take 4,000 mLs by mouth once. 4000 mL 0  . pravastatin (PRAVACHOL) 20 MG tablet Take 20 mg by mouth daily.    [name] zolpidem (AMBIEN) 5 MG tablet Take 5 mg by mouth at bedtime.    [name] albuterol (PROVENTIL) (2.5 MG/3ML) 0.083% nebulizer solution Take 3 mLs (2.5 mg total) by nebulization every 6 (six) hours as needed for wheezing or shortness of breath. 75 mL 12  . nitroGLYCERIN (NITROSTAT) 0.4 MG SL tablet Place 0.4 mg under the tongue every 5 (five) minutes as needed for chest pain.       Results for orders placed or performed during the hospital encounter of 07/26/15 (from the past 48 hour(s))  Glucose, capillary     Status: None   Collection Time: 07/26/15 11:24 AM  Result Value Ref Range   Glucose-Capillary 90 65 -  99 mg/dL   No results found.  ROS  Blood pressure 109/81, pulse 53, temperature 98.5 F (36.9 C), temperature source Oral, resp. rate 14, height 5' 4.5" (1.638 m), weight 190 lb (86.183 kg), SpO2 100 %. Physical Exam  Constitutional: She appears well-developed and well-nourished.  HENT:  Mouth/Throat: Oropharynx is clear and moist.  Eyes: Conjunctivae are normal. No scleral icterus.  Neck: No thyromegaly present.  Cardiovascular: Normal rate, regular rhythm and normal heart sounds.  No murmur heard. Respiratory: Effort normal and breath sounds normal.  GI: Soft. She exhibits no distension and no mass. There is no tenderness.  Musculoskeletal: She exhibits no edema.  Lymphadenopathy:    She has no cervical adenopathy.  Neurological: She is alert.  Skin: Skin is warm and dry.     Assessment/Plan  History of colonic polyps.  Surveillance colonoscopy  Elaynah Virginia U 07/26/2015, 11:54 AM

## 2015-07-26 NOTE — Op Note (Signed)
COLONOSCOPY PROCEDURE REPORT  PATIENT:  [name] [name]  MR#:  [id number] Birthdate:  02-Jan-1936, 79 y.o., female Endoscopist:  Dr. Malissa Hippo, MD Referred By:  Dr.  Alice Reichert, MD Procedure Date: 07/26/2015  Procedure:   Colonoscopy  Indications:   Patient is 22 old Caucasian female was history of colonic polyps and is here for surveillance colonoscopy. Family history significant for colonic polyps in her brother and she believes her first cousin had colon cancer.  Informed Consent:  The procedure and risks were reviewed with the patient and informed consent was obtained.  Medications:  Demerol 50 mg IV Versed 6 mg IV  Description of procedure:  After a digital rectal exam was performed, that colonoscope was advanced from the anus through the rectum and colon to the area of the cecum, ileocecal valve and appendiceal orifice. The cecum was deeply intubated. These structures were well-seen and photographed for the record. From the level of the cecum and ileocecal valve, the scope was slowly and cautiously withdrawn. The mucosal surfaces were carefully surveyed utilizing scope tip to flexion to facilitate fold flattening as needed. The scope was pulled down into the rectum where a thorough exam including retroflexion was performed.  Findings:  . Prep satisfactory. Normal mucosa of cecum, ascending colon, hepatic flexure, transverse colon, splenic flexure, descending and sigmoid colon. Normal rectal mucosa.  Small hemorrhoids below the dentate line.   Therapeutic/Diagnostic Maneuvers Performed:   None  Complications:    none  Cecal Withdrawal Time:   8 minutes  Impression:   Examination performed to cecum.  Small external hemorrhoids otherwise normal colonoscopy.  Recommendations:  Standard instructions given. Patient may consider next exam in 5 years.  [name] [name] U  07/26/2015 12:31 PM  CC: Dr. Alice Reichert, MD & Dr. Bonnetta Barry ref. provider found

## 2015-07-26 NOTE — Discharge Instructions (Signed)
Resume usual medications and diet.  No driving for 24 hours.  May consider next exam in 5 years.  Colonoscopy, Care After Refer to this sheet in the next few weeks. These instructions provide you with information on caring for yourself after your procedure. Your health care provider may also give you more specific instructions. Your treatment has been planned according to current medical practices, but problems sometimes occur. Call your health care provider if you have any problems or questions after your procedure. WHAT TO EXPECT AFTER THE PROCEDURE  After your procedure, it is typical to have the following:  A small amount of blood in your stool.  Moderate amounts of gas and mild abdominal cramping or bloating. HOME CARE INSTRUCTIONS  Do not drive, operate machinery, or sign important documents for 24 hours.  You may shower and resume your regular physical activities, but move at a slower pace for the first 24 hours.  Take frequent rest periods for the first 24 hours.  Walk around or put a warm pack on your abdomen to help reduce abdominal cramping and bloating.  Drink enough fluids to keep your urine clear or pale yellow.  You may resume your normal diet as instructed by your health care provider. Avoid heavy or fried foods that are hard to digest.  Avoid drinking alcohol for 24 hours or as instructed by your health care provider.  Only take over-the-counter or prescription medicines as directed by your health care provider.  If a tissue sample (biopsy) was taken during your procedure:  Do not take aspirin or blood thinners for 7 days, or as instructed by your health care provider.  Do not drink alcohol for 7 days, or as instructed by your health care provider.  Eat soft foods for the first 24 hours. SEEK MEDICAL CARE IF: You have persistent spotting of blood in your stool 2-3 days after the procedure. SEEK IMMEDIATE MEDICAL CARE IF:  You have more than a small spotting  of blood in your stool.  You pass large blood clots in your stool.  Your abdomen is swollen (distended).  You have nausea or vomiting.  You have a fever.  You have increasing abdominal pain that is not relieved with medicine. Document Released: 07/23/2004 Document Revised: 09/29/2013 Document Reviewed: 08/16/2013 [facility] Patient Information 2015 [place], [place]. This information is not intended to replace advice given to you by your health care provider. Make sure you discuss any questions you have with your health care provider.

## 2015-07-28 ENCOUNTER — Encounter (HOSPITAL_COMMUNITY): Payer: Self-pay | Admitting: Internal Medicine

## 2015-08-29 ENCOUNTER — Inpatient Hospital Stay (HOSPITAL_COMMUNITY)
Admission: EM | Admit: 2015-08-29 | Discharge: 2015-09-01 | DRG: 069 | Disposition: A | Discharge status: Home / Self Care | Payer: Medicare HMO | Attending: Family Medicine | Admitting: Family Medicine

## 2015-08-29 ENCOUNTER — Inpatient Hospital Stay (HOSPITAL_COMMUNITY): Payer: Medicare HMO

## 2015-08-29 ENCOUNTER — Emergency Department (HOSPITAL_COMMUNITY): Payer: Medicare HMO

## 2015-08-29 ENCOUNTER — Encounter (HOSPITAL_COMMUNITY): Payer: Self-pay | Admitting: Emergency Medicine

## 2015-08-29 DIAGNOSIS — L409 Psoriasis, unspecified: Secondary | ICD-10-CM | POA: Diagnosis present

## 2015-08-29 DIAGNOSIS — Z951 Presence of aortocoronary bypass graft: Secondary | ICD-10-CM | POA: Diagnosis not present

## 2015-08-29 DIAGNOSIS — Z794 Long term (current) use of insulin: Secondary | ICD-10-CM

## 2015-08-29 DIAGNOSIS — I2510 Atherosclerotic heart disease of native coronary artery without angina pectoris: Secondary | ICD-10-CM | POA: Diagnosis present

## 2015-08-29 DIAGNOSIS — M5136 Other intervertebral disc degeneration, lumbar region: Secondary | ICD-10-CM | POA: Diagnosis present

## 2015-08-29 DIAGNOSIS — Z7982 Long term (current) use of aspirin: Secondary | ICD-10-CM

## 2015-08-29 DIAGNOSIS — N390 Urinary tract infection, site not specified: Secondary | ICD-10-CM | POA: Diagnosis present

## 2015-08-29 DIAGNOSIS — I252 Old myocardial infarction: Secondary | ICD-10-CM

## 2015-08-29 DIAGNOSIS — E1165 Type 2 diabetes mellitus with hyperglycemia: Secondary | ICD-10-CM | POA: Diagnosis present

## 2015-08-29 DIAGNOSIS — G4733 Obstructive sleep apnea (adult) (pediatric): Secondary | ICD-10-CM | POA: Diagnosis present

## 2015-08-29 DIAGNOSIS — E118 Type 2 diabetes mellitus with unspecified complications: Secondary | ICD-10-CM | POA: Diagnosis not present

## 2015-08-29 DIAGNOSIS — R208 Other disturbances of skin sensation: Secondary | ICD-10-CM | POA: Diagnosis not present

## 2015-08-29 DIAGNOSIS — G459 Transient cerebral ischemic attack, unspecified: Secondary | ICD-10-CM | POA: Diagnosis not present

## 2015-08-29 DIAGNOSIS — I679 Cerebrovascular disease, unspecified: Secondary | ICD-10-CM | POA: Diagnosis present

## 2015-08-29 DIAGNOSIS — I5032 Chronic diastolic (congestive) heart failure: Secondary | ICD-10-CM | POA: Diagnosis present

## 2015-08-29 DIAGNOSIS — Z79899 Other long term (current) drug therapy: Secondary | ICD-10-CM

## 2015-08-29 DIAGNOSIS — I10 Essential (primary) hypertension: Secondary | ICD-10-CM | POA: Diagnosis present

## 2015-08-29 DIAGNOSIS — Z885 Allergy status to narcotic agent status: Secondary | ICD-10-CM

## 2015-08-29 DIAGNOSIS — Z6837 Body mass index (BMI) 37.0-37.9, adult: Secondary | ICD-10-CM | POA: Diagnosis not present

## 2015-08-29 DIAGNOSIS — Z9119 Patient's noncompliance with other medical treatment and regimen: Secondary | ICD-10-CM | POA: Diagnosis present

## 2015-08-29 DIAGNOSIS — Z8673 Personal history of transient ischemic attack (TIA), and cerebral infarction without residual deficits: Secondary | ICD-10-CM

## 2015-08-29 DIAGNOSIS — R200 Anesthesia of skin: Secondary | ICD-10-CM

## 2015-08-29 DIAGNOSIS — G629 Polyneuropathy, unspecified: Secondary | ICD-10-CM | POA: Diagnosis present

## 2015-08-29 DIAGNOSIS — E669 Obesity, unspecified: Secondary | ICD-10-CM | POA: Diagnosis present

## 2015-08-29 DIAGNOSIS — J449 Chronic obstructive pulmonary disease, unspecified: Secondary | ICD-10-CM | POA: Diagnosis present

## 2015-08-29 DIAGNOSIS — E039 Hypothyroidism, unspecified: Secondary | ICD-10-CM | POA: Diagnosis present

## 2015-08-29 LAB — COMPREHENSIVE METABOLIC PANEL
ALBUMIN: 3.8 g/dL (ref 3.5–5.0)
ALT: 20 U/L (ref 14–54)
ANION GAP: 7 (ref 5–15)
AST: 21 U/L (ref 15–41)
Alkaline Phosphatase: 79 U/L (ref 38–126)
BILIRUBIN TOTAL: 0.4 mg/dL (ref 0.3–1.2)
BUN: 14 mg/dL (ref 6–20)
CHLORIDE: 106 mmol/L (ref 101–111)
CO2: 25 mmol/L (ref 22–32)
Calcium: 8.9 mg/dL (ref 8.9–10.3)
Creatinine, Ser: 0.63 mg/dL (ref 0.44–1.00)
GFR calc Af Amer: >60 mL/min (ref >60)
GFR calc non Af Amer: >60 mL/min (ref >60)
GLUCOSE: 222 mg/dL — AB (ref 65–99)
POTASSIUM: 3.8 mmol/L (ref 3.5–5.1)
SODIUM: 138 mmol/L (ref 135–145)
TOTAL PROTEIN: 7.3 g/dL (ref 6.5–8.1)

## 2015-08-29 LAB — I-STAT CHEM 8, ED
BUN: 13 mg/dL (ref 6–20)
CALCIUM ION: 1.17 mmol/L (ref 1.13–1.30)
CREATININE: 0.6 mg/dL (ref 0.44–1.00)
Chloride: 104 mmol/L (ref 101–111)
Glucose, Bld: 229 mg/dL — ABNORMAL HIGH (ref 65–99)
HCT: 36 % (ref 36.0–46.0)
Hemoglobin: 12.2 g/dL (ref 12.0–15.0)
Potassium: 4 mmol/L (ref 3.5–5.1)
Sodium: 140 mmol/L (ref 135–145)
TCO2: 22 mmol/L (ref 0–100)

## 2015-08-29 LAB — DIFFERENTIAL
BASOS ABS: 0.1 10*3/uL (ref 0.0–0.1)
Basophils Relative: 1 % (ref 0–1)
EOS ABS: 0.6 10*3/uL (ref 0.0–0.7)
EOS PCT: 6 % — AB (ref 0–5)
LYMPHS ABS: 3.6 10*3/uL (ref 0.7–4.0)
Lymphocytes Relative: 34 % (ref 12–46)
Monocytes Absolute: 0.8 10*3/uL (ref 0.1–1.0)
Monocytes Relative: 7 % (ref 3–12)
NEUTROS PCT: 53 % (ref 43–77)
Neutro Abs: 5.7 10*3/uL (ref 1.7–7.7)

## 2015-08-29 LAB — URINALYSIS, ROUTINE W REFLEX MICROSCOPIC
BILIRUBIN URINE: NEGATIVE
KETONES UR: NEGATIVE mg/dL
NITRITE: POSITIVE — AB
PH: 5.5 (ref 5.0–8.0)
Protein, ur: NEGATIVE mg/dL
Urobilinogen, UA: 0.2 mg/dL (ref 0.0–1.0)

## 2015-08-29 LAB — RAPID URINE DRUG SCREEN, HOSP PERFORMED
AMPHETAMINES: NOT DETECTED
BARBITURATES: NOT DETECTED
BENZODIAZEPINES: POSITIVE — AB
COCAINE: NOT DETECTED
OPIATES: NOT DETECTED
TETRAHYDROCANNABINOL: NOT DETECTED

## 2015-08-29 LAB — CBC
HEMATOCRIT: 36.1 % (ref 36.0–46.0)
HEMOGLOBIN: 10.6 g/dL — AB (ref 12.0–15.0)
MCH: 21.4 pg — ABNORMAL LOW (ref 26.0–34.0)
MCHC: 29.4 g/dL — ABNORMAL LOW (ref 30.0–36.0)
MCV: 72.8 fL — ABNORMAL LOW (ref 78.0–100.0)
Platelets: 300 10*3/uL (ref 150–400)
RBC: 4.96 MIL/uL (ref 3.87–5.11)
RDW: 18 % — AB (ref 11.5–15.5)
WBC: 10.7 10*3/uL — AB (ref 4.0–10.5)

## 2015-08-29 LAB — PROTIME-INR
INR: 1.04 (ref 0.00–1.49)
Prothrombin Time: 13.8 seconds (ref 11.6–15.2)

## 2015-08-29 LAB — URINE MICROSCOPIC-ADD ON

## 2015-08-29 LAB — GLUCOSE, CAPILLARY: GLUCOSE-CAPILLARY: 337 mg/dL — AB (ref 65–99)

## 2015-08-29 LAB — ETHANOL

## 2015-08-29 LAB — APTT: APTT: 28 s (ref 24–37)

## 2015-08-29 LAB — CBG MONITORING, ED: Glucose-Capillary: 240 mg/dL — ABNORMAL HIGH (ref 65–99)

## 2015-08-29 LAB — SEDIMENTATION RATE: Sed Rate: 10 mm/hr (ref 0–22)

## 2015-08-29 LAB — I-STAT TROPONIN, ED: Troponin i, poc: 0 ng/mL (ref 0.00–0.08)

## 2015-08-29 MED ORDER — ONDANSETRON HCL 4 MG/2ML IJ SOLN
4.0000 mg | Freq: Four times a day (QID) | INTRAMUSCULAR | Status: DC | PRN
  Administered 2015-08-30 – 2015-08-31 (×2): 4 mg via INTRAVENOUS
  Filled 2015-08-29 (×2): qty 2

## 2015-08-29 MED ORDER — ACETAMINOPHEN 500 MG PO TABS
1000.0000 mg | ORAL_TABLET | Freq: Once | ORAL | Status: AC
  Administered 2015-08-29: 1000 mg via ORAL
  Filled 2015-08-29: qty 2

## 2015-08-29 MED ORDER — INSULIN ASPART 100 UNIT/ML ~~LOC~~ SOLN
0.0000 [IU] | Freq: Three times a day (TID) | SUBCUTANEOUS | Status: DC
Start: 2015-08-30 — End: 2015-09-01
  Administered 2015-08-30 (×2): 4 [IU] via SUBCUTANEOUS
  Administered 2015-08-30 – 2015-08-31 (×3): 3 [IU] via SUBCUTANEOUS
  Administered 2015-08-31 – 2015-09-01 (×3): 4 [IU] via SUBCUTANEOUS
  Administered 2015-09-01: 3 [IU] via SUBCUTANEOUS

## 2015-08-29 MED ORDER — METFORMIN HCL 500 MG PO TABS
500.0000 mg | ORAL_TABLET | Freq: Two times a day (BID) | ORAL | Status: DC
  Administered 2015-08-30 – 2015-09-01 (×6): 500 mg via ORAL
  Filled 2015-08-29 (×6): qty 1

## 2015-08-29 MED ORDER — INSULIN DETEMIR 100 UNIT/ML ~~LOC~~ SOLN
40.0000 [IU] | Freq: Two times a day (BID) | SUBCUTANEOUS | Status: DC
  Administered 2015-08-29 – 2015-09-01 (×6): 40 [IU] via SUBCUTANEOUS
  Filled 2015-08-29 (×12): qty 0.4

## 2015-08-29 MED ORDER — LEVOTHYROXINE SODIUM 25 MCG PO TABS
25.0000 ug | ORAL_TABLET | Freq: Every day | ORAL | Status: DC
  Administered 2015-08-30 – 2015-09-01 (×3): 25 ug via ORAL
  Filled 2015-08-29 (×3): qty 1

## 2015-08-29 MED ORDER — INSULIN ASPART 100 UNIT/ML ~~LOC~~ SOLN
0.0000 [IU] | Freq: Every day | SUBCUTANEOUS | Status: DC
  Administered 2015-08-29: 2 [IU] via SUBCUTANEOUS

## 2015-08-29 MED ORDER — ONDANSETRON HCL 4 MG PO TABS
4.0000 mg | ORAL_TABLET | Freq: Four times a day (QID) | ORAL | Status: DC | PRN
  Administered 2015-08-30 (×2): 4 mg via ORAL
  Filled 2015-08-29 (×2): qty 1

## 2015-08-29 MED ORDER — INSULIN DETEMIR 100 UNIT/ML ~~LOC~~ SOLN: 30.0000 [IU] | Freq: Two times a day (BID) | SUBCUTANEOUS | Status: DC

## 2015-08-29 MED ORDER — DIAZEPAM 5 MG PO TABS
5.0000 mg | ORAL_TABLET | Freq: Four times a day (QID) | ORAL | Status: DC | PRN
  Administered 2015-08-29 – 2015-09-01 (×3): 5 mg via ORAL
  Filled 2015-08-29 (×3): qty 1

## 2015-08-29 MED ORDER — MELOXICAM 7.5 MG PO TABS
15.0000 mg | ORAL_TABLET | Freq: Every day | ORAL | Status: DC
  Administered 2015-08-30 – 2015-09-01 (×3): 15 mg via ORAL
  Filled 2015-08-29 (×3): qty 2
  Filled 2015-08-29 (×2): qty 1
  Filled 2015-08-29 (×2): qty 2

## 2015-08-29 MED ORDER — DEXTROSE 5 % IV SOLN
1.0000 g | INTRAVENOUS | Status: DC
  Administered 2015-08-29 – 2015-08-31 (×3): 1 g via INTRAVENOUS
  Filled 2015-08-29 (×6): qty 10

## 2015-08-29 MED ORDER — ONDANSETRON HCL 4 MG/2ML IJ SOLN
4.0000 mg | Freq: Once | INTRAMUSCULAR | Status: AC
  Administered 2015-08-29: 4 mg via INTRAVENOUS
  Filled 2015-08-29: qty 2

## 2015-08-29 MED ORDER — PRAVASTATIN SODIUM 10 MG PO TABS
20.0000 mg | ORAL_TABLET | Freq: Every day | ORAL | Status: DC
  Administered 2015-08-29 – 2015-09-01 (×4): 20 mg via ORAL
  Filled 2015-08-29 (×4): qty 2

## 2015-08-29 MED ORDER — LISINOPRIL 10 MG PO TABS
10.0000 mg | ORAL_TABLET | Freq: Every morning | ORAL | Status: DC
  Administered 2015-08-30 – 2015-09-01 (×3): 10 mg via ORAL
  Filled 2015-08-29 (×3): qty 1

## 2015-08-29 MED ORDER — ZOLPIDEM TARTRATE 5 MG PO TABS
5.0000 mg | ORAL_TABLET | Freq: Every day | ORAL | Status: DC
  Administered 2015-08-29 – 2015-08-31 (×3): 5 mg via ORAL
  Filled 2015-08-29 (×4): qty 1

## 2015-08-29 MED ORDER — ASPIRIN EC 325 MG PO TBEC
325.0000 mg | DELAYED_RELEASE_TABLET | Freq: Every day | ORAL | Status: DC
  Administered 2015-08-30 – 2015-09-01 (×3): 325 mg via ORAL
  Filled 2015-08-29 (×3): qty 1

## 2015-08-29 MED ORDER — ENOXAPARIN SODIUM 40 MG/0.4ML ~~LOC~~ SOLN
40.0000 mg | SUBCUTANEOUS | Status: DC
  Administered 2015-08-29 – 2015-08-31 (×3): 40 mg via SUBCUTANEOUS
  Filled 2015-08-29 (×3): qty 0.4

## 2015-08-29 MED ORDER — SODIUM CHLORIDE 0.9 % IJ SOLN
3.0000 mL | Freq: Two times a day (BID) | INTRAMUSCULAR | Status: DC
  Administered 2015-08-29 – 2015-09-01 (×6): 3 mL via INTRAVENOUS

## 2015-08-29 MED ORDER — NITROGLYCERIN 0.4 MG SL SUBL: 0.4000 mg | SUBLINGUAL_TABLET | SUBLINGUAL | Status: DC | PRN

## 2015-08-29 MED ORDER — ALBUTEROL SULFATE (2.5 MG/3ML) 0.083% IN NEBU: 2.5000 mg | INHALATION_SOLUTION | Freq: Four times a day (QID) | RESPIRATORY_TRACT | Status: DC | PRN

## 2015-08-29 MED ORDER — DEXTROSE 5 % IV SOLN
INTRAVENOUS | Status: AC
  Filled 2015-08-29: qty 10

## 2015-08-29 NOTE — ED Provider Notes (Addendum)
CSN: [id number]     Arrival date & time 08/29/15  1641 History   First MD Initiated Contact with Patient 08/29/15 1648     Chief Complaint  Patient presents with  . Code Stroke    An emergency department physician performed an initial assessment on this suspected stroke patient at 1700. (Consider location/radiation/quality/duration/timing/severity/associated sxs/prior Treatment) HPI Comments: Patient is a 79 year old female with prior history of stroke, diabetes, hypertension. She presents for evaluation of numbness to the left side of her face that started approximately 3:30 this afternoon. She denies any involvement of her arm or leg. She reports blurry vision in her left eye, however has some baseline blurriness and is unsure if this is worse or not. She called her primary Dr. who recommended she come to the ER to be evaluated. There are no aggravating or alleviating factors.  The history is provided by the patient.    Past Medical History  Diagnosis Date  . Arteriosclerotic cardiovascular disease (ASCVD) CARDIOLOGIST-   DR Dietrich Pates     CABG surgery in 2001; nl stress nuclear-2007; angiography in 01/2006- TO of LAD; 70% ostial stenosis      of a small ramus; patent grafts; normal EF; cath in 12/2009 essentially unchanged; EF-66%;  Congestive heart failure with preserved LV systolic function  . Hypertension   . Varicose veins   . Hypothyroidism   . Insulin dependent type 2 diabetes mellitus   . Peripheral neuropathy   . Diastolic CHF   . Hyperlipidemia   . History of CVA (cerebrovascular accident)     2013--  LACUNAR INFARCTIONS RIGHT THALAMUS---  residual left side of  lip numb  and left eye vision worse (which corrented lens implant from cataract extraction)  . Lesion of bladder   . Hematuria   . Arthritis   . DDD (degenerative disc disease), lumbar   . OSA (obstructive sleep apnea)     NON-COMPLIANT CPAP  . Chronic obstructive pulmonary disease     Chronic bronchitis  . History  of MI (myocardial infarction)     07/ 2001   s/p  cabg x2  . S/P CABG x 2     07-02-2000  . SUI (stress urinary incontinence, female)   . Lower urinary tract symptoms (LUTS)   . Psoriasis     lower leg  . Thinning of skin    Past Surgical History  Procedure Laterality Date  . Rotator cuff repair Right 1995  . Lumbar spine surgery  x2    yrs ago  . Carpal tunnel release Bilateral yrs ago  . Orif humerus fracture Right 04-18-2008  . Knee arthroscopy Left 10-18-2003    SYNOVECTOMY  . Repair flexor tendon hand Right 09-20-2008    CARPI RADIALIS TENDON TRANSFER TO EXTENSOR TO INDEX, MIDDLE, RING , LITTLE FINGERS AND DIGITI MININI  . Cardiovascular stress test  08-28-2011   DR ROTHBART    NEGATIVE NUCLEAR STUDY/  GLOBAL LVSF/  EF 65%  . Cholecystectomy  1990  . Appendectomy  age 33  . Total abdominal hysterectomy w/ bilateral salpingoophorectomy  age 54  . Cataract extraction w/ intraocular lens  implant, bilateral  2013  . Cardiac catheterization  1991    No sig. cad  . Coronary angioplasty  11-13-1999    balloon angioplasty to mLAD , d2 of LAD  . Coronary angioplasty with stent placement  01-17-2000    PCI stenting to mLAD & D2 of LAD  . Cardiac catheterization  07-01-2000    high  grade in-stent restenosis  . Cardiac catheterization  04-30-2001    single native vessel cad/ graft patent  . Cardiac catheterization  02-13-2006  dr Juanda Chance    2V cad with total occluded LAD  and  70% ostial of small ramus/  grafts patent/  normal ef  . Cardiac catheterization  01-20-2000  dr Clifton James    essentially unchanged;  severe single vessel cad with diffuse disease throughout CFX and RCA/ ef 66%;  chf with preserved LVSF  . Coronary artery bypass graft  07-02-2000   DR Tressie Stalker    SVG to diagonal, LIMA to LAD  . Transthoracic echocardiogram  12-12-2013    MILD LVH/  EF 60-65%/  GRADE I DIASTOLIC DYSFUNCTION/  MILD LAE  . Cystoscopy w/ retrogrades Bilateral 08/09/2014    Procedure:  CYSTOSCOPY, BILATERAL RETROGRADE, HYDRODISTENSION, BLADDER BIOPSY WITH FULGERATION, INSTILL PYRIDIUM AND MARCAINE ;  Surgeon: Anner Crete, MD;  Location: [facility];  Service: Urology;  Laterality: Bilateral;  . Abdominal hysterectomy    . Colonoscopy N/A 07/26/2015    Procedure: COLONOSCOPY;  Surgeon: Malissa Hippo, MD;  [facility];  Service: Endoscopy;  Laterality: N/A;  1200   Family History  Problem Relation Age of Onset  . Coronary artery disease      Multiple first and second-degree relatives  . Aneurysm      Cerebral circulation  . Hypertension Mother   . Colon cancer Sister   . Clotting disorder      Children diagnosed with hypercoagulable state   Social History  Substance Use Topics  . Smoking status: Never Smoker   . Smokeless tobacco: Never Used  . Alcohol Use: No   OB History    Gravida Para Term Preterm AB TAB SAB Ectopic Multiple Living   7 7 7       4      Review of Systems  All other systems reviewed and are negative.     Allergies  Codeine  Home Medications   Prior to Admission medications   Medication Sig Start Date End Date Taking? Authorizing Provider  aspirin EC 325 MG tablet Take 325 mg by mouth daily.   Yes Historical Provider, MD  albuterol (PROVENTIL) (2.5 MG/3ML) 0.083% nebulizer solution Take 3 mLs (2.5 mg total) by nebulization every 6 (six) hours as needed for wheezing or shortness of breath. 09/11/14   Eber Hong, MD  diazepam (VALIUM) 5 MG tablet Take 5 mg by mouth every 6 (six) hours as needed for anxiety.    Historical Provider, MD  insulin detemir (LEVEMIR) 100 UNIT/ML injection Inject 30-70 Units into the skin 2 (two) times daily. 30 units in the morning and 70 units at bedtime    Historical Provider, MD  insulin lispro (HUMALOG) 100 UNIT/ML injection Inject 20 Units into the skin 3 (three) times daily.     Historical Provider, MD  levothyroxine (SYNTHROID, LEVOTHROID) 25 MCG tablet Take 25 mcg by mouth  every morning.     Historical Provider, MD  lisinopril (PRINIVIL,ZESTRIL) 10 MG tablet Take 1 tablet by mouth every morning.  11/09/13   Historical Provider, MD  metFORMIN (GLUCOPHAGE) 500 MG tablet Take 500 mg by mouth 2 (two) times daily with a meal.      Historical Provider, MD  Multiple Vitamins-Calcium (ONE-A-DAY WOMENS PO) Take 1 tablet by mouth daily.    Historical Provider, MD  nitroGLYCERIN (NITROSTAT) 0.4 MG SL tablet Place 0.4 mg under the tongue every 5 (five) minutes as  needed for chest pain.     Historical Provider, MD  pravastatin (PRAVACHOL) 20 MG tablet Take 20 mg by mouth daily.    Historical Provider, MD  zolpidem (AMBIEN) 5 MG tablet Take 5 mg by mouth at bedtime. 08/12/14   Historical Provider, MD   BP 134/101 mmHg  Pulse 68  Temp(Src) 98.3 F (36.8 C) (Oral)  Resp 11  Ht  (1.575 m)  Wt 190 lb (86.183 kg)  BMI 34.74 kg/m2  SpO2 99% Physical Exam  Constitutional: She is oriented to person, place, and time. She appears well-developed and well-nourished. No distress.  HENT:  Head: Normocephalic and atraumatic.  Eyes: EOM are normal. Pupils are equal, round, and reactive to light.  Neck: Normal range of motion. Neck supple.  Cardiovascular: Normal rate and regular rhythm.  Exam reveals no gallop and no friction rub.   No murmur heard. Pulmonary/Chest: Effort normal and breath sounds normal. No respiratory distress. She has no wheezes.  Abdominal: Soft. Bowel sounds are normal. She exhibits no distension. There is no tenderness.  Musculoskeletal: Normal range of motion.  Neurological: She is alert and oriented to person, place, and time. She exhibits normal muscle tone. Coordination normal.  There is perhaps a slight left facial droop noted.  Skin: Skin is warm and dry. She is not diaphoretic.  Nursing note and vitals reviewed.   ED Course  Procedures (including critical care time) Labs Review Labs Reviewed  CBG MONITORING, ED - Abnormal; Notable for the  following:    Glucose-Capillary 240 (*)    All other components within normal limits  I-STAT CHEM 8, ED - Abnormal; Notable for the following:    Glucose, Bld 229 (*)    All other components within normal limits  PROTIME-INR  APTT  ETHANOL  CBC  DIFFERENTIAL  COMPREHENSIVE METABOLIC PANEL  URINE RAPID DRUG SCREEN, HOSP PERFORMED  URINALYSIS, ROUTINE W REFLEX MICROSCOPIC (NOT AT [facility])  I-STAT CHEM 8, ED  I-STAT TROPOININ, ED    Imaging Review Ct Head Wo Contrast  08/29/2015   CLINICAL DATA:  Left facial numbness, left-sided blurry vision.  EXAM: CT HEAD WITHOUT CONTRAST  TECHNIQUE: Contiguous axial images were obtained from the base of the skull through the vertex without intravenous contrast.  COMPARISON:  CT scan of head of July 18, 2015.  FINDINGS: Bony calvarium appears intact. Mild diffuse cortical atrophy is noted. Moderate chronic ischemic white matter disease is noted. No mass effect or midline shift is noted. Ventricular size is within normal limits. There is no evidence of mass lesion, hemorrhage or acute infarction.  IMPRESSION: Mild diffuse cortical atrophy. Moderate chronic ischemic white matter disease. No acute intracranial abnormality seen. These results were called by telephone at the time of interpretation on 08/29/2015 at 5:21 pm to Dr. Geoffery Lyons , who verbally acknowledged these results.   Electronically Signed   By: Lupita Raider, M.D.   On: 08/29/2015 17:23   I have personally reviewed and evaluated these images and lab results as part of my medical decision-making.   EKG Interpretation   Date/Time:  Tuesday August 29 2015 16:44:19 EDT Ventricular Rate:  73 PR Interval:  219 QRS Duration: 106 QT Interval:  423 QTC Calculation: 466 R Axis:   -53 Text Interpretation:  Sinus rhythm Borderline prolonged PR interval Left  anterior fascicular block Abnormal R-wave progression, late transition  Abnormal T, consider ischemia, lateral leads Confirmed by Kongmeng Santoro  MD,   Isamu Trammel ([id number]) on 08/29/2015 6:01:24 PM  MDM   Final diagnoses:  None    Patient presents with complaints of left-sided facial numbness that started at 3:30 this afternoon. She has a history of stroke and for this reason a code stroke was initiated. She underwent head CT which was unremarkable for acute stroke and no other acute abnormality in the laboratory studies. She underwent teleneurology consultation who recommended against thrombo-lysis. She will be admitted to the hospitalist service under the care of Dr. Karilyn Cota.   CRITICAL CARE Performed by: Geoffery Lyons Total critical care time: 30 minutes Critical care time was exclusive of separately billable procedures and treating other patients. Critical care was necessary to treat or prevent imminent or life-threatening deterioration. Critical care was time spent personally by me on the following activities: development of treatment plan with patient and/or surrogate as well as nursing, discussions with consultants, evaluation of patient's response to treatment, examination of patient, obtaining history from patient or surrogate, ordering and performing treatments and interventions, ordering and review of laboratory studies, ordering and review of radiographic studies, pulse oximetry and re-evaluation of patient's condition.     Geoffery Lyons, MD 08/29/15 1610  Geoffery Lyons, MD 08/29/15 [phone]

## 2015-08-29 NOTE — H&P (Signed)
[facility] History and Physical  [name] [name]:[id number] DOB: [birth year] DOA: 08/29/2015  Referring physician: ER PCP: Alice Reichert, MD   Chief Complaint: Left facial numbness  HPI: [name] [name] is a 79 y.o. female  This is a 79 year old lady, who has a previous history of stroke, who had sudden onset of numbness to the left side of her face that started at 3:30 PM today. There was no weakness in the arm or leg. She reported some blurry vision the left eye but no confusion and no apparent dysphasia. She spoke with her primary care physician who recommended to come to the emergency room.   Review of Systems:  Apart from symptoms above, all systems are negative.  Past Medical History  Diagnosis Date  . Arteriosclerotic cardiovascular disease (ASCVD) CARDIOLOGIST-   DR Dietrich Pates     CABG surgery in 2001; nl stress nuclear-2007; angiography in 01/2006- TO of LAD; 70% ostial stenosis      of a small ramus; patent grafts; normal EF; cath in 12/2009 essentially unchanged; EF-66%;  Congestive heart failure with preserved LV systolic function  . Hypertension   . Varicose veins   . Hypothyroidism   . Insulin dependent type 2 diabetes mellitus   . Peripheral neuropathy   . Diastolic CHF   . Hyperlipidemia   . History of CVA (cerebrovascular accident)     2013--  LACUNAR INFARCTIONS RIGHT THALAMUS---  residual left side of  lip numb  and left eye vision worse (which corrented lens implant from cataract extraction)  . Lesion of bladder   . Hematuria   . Arthritis   . DDD (degenerative disc disease), lumbar   . OSA (obstructive sleep apnea)     NON-COMPLIANT CPAP  . Chronic obstructive pulmonary disease     Chronic bronchitis  . History of MI (myocardial infarction)     07/ 2001   s/p  cabg x2  . S/P CABG x 2     07-02-2000  . SUI (stress urinary incontinence, female)   . Lower urinary tract symptoms (LUTS)   . Psoriasis     lower leg  . Thinning of skin    Past  Surgical History  Procedure Laterality Date  . Rotator cuff repair Right 1995  . Lumbar spine surgery  x2    yrs ago  . Carpal tunnel release Bilateral yrs ago  . Orif humerus fracture Right 04-18-2008  . Knee arthroscopy Left 10-18-2003    SYNOVECTOMY  . Repair flexor tendon hand Right 09-20-2008    CARPI RADIALIS TENDON TRANSFER TO EXTENSOR TO INDEX, MIDDLE, RING , LITTLE FINGERS AND DIGITI MININI  . Cardiovascular stress test  08-28-2011   DR ROTHBART    NEGATIVE NUCLEAR STUDY/  GLOBAL LVSF/  EF 65%  . Cholecystectomy  1990  . Appendectomy  age 61  . Total abdominal hysterectomy w/ bilateral salpingoophorectomy  age 40  . Cataract extraction w/ intraocular lens  implant, bilateral  2013  . Cardiac catheterization  1991    No sig. cad  . Coronary angioplasty  11-13-1999    balloon angioplasty to mLAD , d2 of LAD  . Coronary angioplasty with stent placement  01-17-2000    PCI stenting to mLAD & D2 of LAD  . Cardiac catheterization  07-01-2000    high grade in-stent restenosis  . Cardiac catheterization  04-30-2001    single native vessel cad/ graft patent  . Cardiac catheterization  02-13-2006  dr Juanda Chance    2V cad  with total occluded LAD  and  70% ostial of small ramus/  grafts patent/  normal ef  . Cardiac catheterization  01-20-2000  dr Clifton James    essentially unchanged;  severe single vessel cad with diffuse disease throughout CFX and RCA/ ef 66%;  chf with preserved LVSF  . Coronary artery bypass graft  07-02-2000   DR Tressie Stalker    SVG to diagonal, LIMA to LAD  . Transthoracic echocardiogram  12-12-2013    MILD LVH/  EF 60-65%/  GRADE I DIASTOLIC DYSFUNCTION/  MILD LAE  . Cystoscopy w/ retrogrades Bilateral 08/09/2014    Procedure: CYSTOSCOPY, BILATERAL RETROGRADE, HYDRODISTENSION, BLADDER BIOPSY WITH FULGERATION, INSTILL PYRIDIUM AND MARCAINE ;  Surgeon: Anner Crete, MD;  Location: [facility];  Service: Urology;  Laterality: Bilateral;  . Abdominal  hysterectomy    . Colonoscopy N/A 07/26/2015    Procedure: COLONOSCOPY;  Surgeon: Malissa Hippo, MD;  Location: [facility];  Service: Endoscopy;  Laterality: N/A;  1200   Social History:  reports that she has never smoked. She has never used smokeless tobacco. She reports that she does not drink alcohol or use illicit drugs.  Allergies  Allergen Reactions  . Codeine Itching    Family History  Problem Relation Age of Onset  . Coronary artery disease      Multiple first and second-degree relatives  . Aneurysm      Cerebral circulation  . Hypertension Mother   . Colon cancer Sister   . Clotting disorder      Children diagnosed with hypercoagulable state     Prior to Admission medications   Medication Sig Start Date End Date Taking? Authorizing Provider  aspirin EC 325 MG tablet Take 325 mg by mouth daily.   Yes Historical Provider, MD  diazepam (VALIUM) 5 MG tablet Take 5 mg by mouth every 6 (six) hours as needed for anxiety.   Yes Historical Provider, MD  Insulin Degludec (TRESIBA FLEXTOUCH) 200 UNIT/ML SOPN Inject 35-70 Units into the skin 2 (two) times daily. 35 units in the morning and 70 units in the evening   Yes Historical Provider, MD  lisinopril (PRINIVIL,ZESTRIL) 10 MG tablet Take 1 tablet by mouth every morning.  11/09/13  Yes Historical Provider, MD  meloxicam (MOBIC) 15 MG tablet Take 15 mg by mouth daily.   Yes Historical Provider, MD  metFORMIN (GLUCOPHAGE) 500 MG tablet Take 500 mg by mouth 2 (two) times daily with a meal.     Yes Historical Provider, MD  pravastatin (PRAVACHOL) 20 MG tablet Take 20 mg by mouth daily.   Yes Historical Provider, MD  zolpidem (AMBIEN) 5 MG tablet Take 5 mg by mouth at bedtime. 08/12/14  Yes Historical Provider, MD  albuterol (PROVENTIL) (2.5 MG/3ML) 0.083% nebulizer solution Take 3 mLs (2.5 mg total) by nebulization every 6 (six) hours as needed for wheezing or shortness of breath. 09/11/14   Eber Hong, MD  insulin detemir (LEVEMIR)  100 UNIT/ML injection Inject 30-70 Units into the skin 2 (two) times daily. 30 units in the morning and 70 units at bedtime    Historical Provider, MD  insulin lispro (HUMALOG) 100 UNIT/ML injection Inject 20 Units into the skin 3 (three) times daily.     Historical Provider, MD  levothyroxine (SYNTHROID, LEVOTHROID) 25 MCG tablet Take 25 mcg by mouth every morning.     Historical Provider, MD  Multiple Vitamins-Calcium (ONE-A-DAY WOMENS PO) Take 1 tablet by mouth daily.    Historical Provider,  MD  nitroGLYCERIN (NITROSTAT) 0.4 MG SL tablet Place 0.4 mg under the tongue every 5 (five) minutes as needed for chest pain.     Historical Provider, MD   Physical Exam: Filed Vitals:   08/29/15 1830 08/29/15 1845 08/29/15 1856 08/29/15 1857  BP: 168/89 178/88 176/63   Pulse: 83 89 69   Temp:   98.2 F (36.8 C)   TempSrc:      Resp: 13 18 18    Height:   5\' 2"  (1.575 m)   Weight:    93.486 kg (206 lb 1.6 oz)  SpO2: 95% 94% 100%     Wt Readings from Last 3 Encounters:  08/29/15 93.486 kg (206 lb 1.6 oz)  07/26/15 86.183 kg (190 lb)  06/29/15 90.674 kg (199 lb 14.4 oz)    General:  Appears calm and comfortable Eyes: PERRL, normal lids, irises & conjunctiva ENT: grossly normal hearing, lips & tongue Neck: no LAD, masses or thyromegaly Cardiovascular: RRR, no m/r/g. No LE edema. Telemetry: SR, no arrhythmias  Respiratory: CTA bilaterally, no w/r/r. Normal respiratory effort. Abdomen: soft, ntnd Skin: no rash or induration seen on limited exam Musculoskeletal: grossly normal tone BUE/BLE Psychiatric: grossly normal mood and affect, speech fluent and appropriate Neurologic: Mild left upper motor neuron facial palsy. No limb weakness.           Labs on Admission:  Basic Metabolic Panel:  Recent Labs Lab 08/29/15 1700 08/29/15 1706  NA 138 140  K 3.8 4.0  CL 106 104  CO2 25  --   GLUCOSE 222* 229*  BUN 14 13  CREATININE 0.63 0.60  CALCIUM 8.9  --    Liver Function  Tests:  Recent Labs Lab 08/29/15 1700  AST 21  ALT 20  ALKPHOS 79  BILITOT 0.4  PROT 7.3  ALBUMIN 3.8   No results for input(s): LIPASE, AMYLASE in the last 168 hours. No results for input(s): AMMONIA in the last 168 hours. CBC:  Recent Labs Lab 08/29/15 1700 08/29/15 1706  WBC 10.7*  --   NEUTROABS 5.7  --   HGB 10.6* 12.2  HCT 36.1 36.0  MCV 72.8*  --   PLT 300  --    Cardiac Enzymes: No results for input(s): CKTOTAL, CKMB, CKMBINDEX, TROPONINI in the last 168 hours.  BNP (last 3 results) No results for input(s): BNP in the last 8760 hours.  ProBNP (last 3 results)  Recent Labs  11/13/14 1051  PROBNP 88.0    CBG:  Recent Labs Lab 08/29/15 1642  GLUCAP 240*    Radiological Exams on Admission: Ct Head Wo Contrast  08/29/2015   CLINICAL DATA:  Left facial numbness, left-sided blurry vision.  EXAM: CT HEAD WITHOUT CONTRAST  TECHNIQUE: Contiguous axial images were obtained from the base of the skull through the vertex without intravenous contrast.  COMPARISON:  CT scan of head of July 18, 2015.  FINDINGS: Bony calvarium appears intact. Mild diffuse cortical atrophy is noted. Moderate chronic ischemic white matter disease is noted. No mass effect or midline shift is noted. Ventricular size is within normal limits. There is no evidence of mass lesion, hemorrhage or acute infarction.  IMPRESSION: Mild diffuse cortical atrophy. Moderate chronic ischemic white matter disease. No acute intracranial abnormality seen. These results were called by telephone at the time of interpretation on 08/29/2015 at 5:21 pm to Dr. Geoffery Lyons , who verbally acknowledged these results.   Electronically Signed   By: Lupita Raider, M.D.   On:  08/29/2015 17:23     Assessment/Plan   1. Left facial numbness. There appears to be left upper motor neuron palsy, possibly a new CVA. Order MRI brain scan, carotid Dopplers and I will request neurology consultation. 2. Diabetes. Continue with  home medications and sliding scale of insulin. 3. Hypertension. Stable.  Further recommendations will depend on patient's hospital progress.   Code Status: Full code.  DVT Prophylaxis: Lovenox.  Family Communication: I discussed the plan with the patient at the bedside.   Disposition Plan: Home when medically stable.  Time spent: 60 minutes.  Wilson Singer [facility] Pager [phone].

## 2015-08-29 NOTE — Progress Notes (Signed)
1610 Patient arrived to 300 dept room# 304. Neuro checks and vital signs initiated @ 1856. A&O x 4, resting in bed at this time. Oriented to room, staff and call bell, bed in lowest position.

## 2015-08-29 NOTE — ED Notes (Signed)
Code Stroke called at 1655 by Dr. Judd Lien.  Paged out.  Pt to Ct at 1653.  SOC called.  Monitor placed in the room and ready for Pts. Return from Ct.  Pt returned at 1710 and and began consult with SOC at 1712.

## 2015-08-29 NOTE — ED Notes (Signed)
Pt given meal tray with MD Delo approval.

## 2015-08-29 NOTE — ED Notes (Signed)
Attempted report x1. 

## 2015-08-29 NOTE — ED Notes (Signed)
Pt reports ha starting at 1530 today. Pt states she felt a "popping" sensation to left temple. Reports tingling and numbness to left face and blurred vision to left eye. Pt states she has h/s stroke affecting left side. Pt alert and oriented. States she took  asa pta. Speech clear, moves all extremities. nad noted.

## 2015-08-29 NOTE — Progress Notes (Signed)
Patient complained of headache and chronic back pain. Paged the on-call MD, will follow any new orders given and continue to monitor.

## 2015-08-30 ENCOUNTER — Inpatient Hospital Stay (HOSPITAL_COMMUNITY): Payer: Medicare HMO

## 2015-08-30 LAB — COMPREHENSIVE METABOLIC PANEL
ALBUMIN: 3.2 g/dL — AB (ref 3.5–5.0)
ALT: 17 U/L (ref 14–54)
ANION GAP: 4 — AB (ref 5–15)
AST: 15 U/L (ref 15–41)
Alkaline Phosphatase: 71 U/L (ref 38–126)
BILIRUBIN TOTAL: 0.4 mg/dL (ref 0.3–1.2)
BUN: 13 mg/dL (ref 6–20)
CHLORIDE: 107 mmol/L (ref 101–111)
CO2: 30 mmol/L (ref 22–32)
Calcium: 9 mg/dL (ref 8.9–10.3)
Creatinine, Ser: 0.69 mg/dL (ref 0.44–1.00)
GFR calc Af Amer: >60 mL/min (ref >60)
GFR calc non Af Amer: >60 mL/min (ref >60)
GLUCOSE: 185 mg/dL — AB (ref 65–99)
POTASSIUM: 4.1 mmol/L (ref 3.5–5.1)
SODIUM: 141 mmol/L (ref 135–145)
TOTAL PROTEIN: 6.1 g/dL — AB (ref 6.5–8.1)

## 2015-08-30 LAB — CBC
HEMATOCRIT: 34.6 % — AB (ref 36.0–46.0)
HEMOGLOBIN: 10.2 g/dL — AB (ref 12.0–15.0)
MCH: 21.7 pg — ABNORMAL LOW (ref 26.0–34.0)
MCHC: 29.5 g/dL — AB (ref 30.0–36.0)
MCV: 73.6 fL — AB (ref 78.0–100.0)
Platelets: 269 10*3/uL (ref 150–400)
RBC: 4.7 MIL/uL (ref 3.87–5.11)
RDW: 18 % — ABNORMAL HIGH (ref 11.5–15.5)
WBC: 8.7 10*3/uL (ref 4.0–10.5)

## 2015-08-30 LAB — GLUCOSE, CAPILLARY
GLUCOSE-CAPILLARY: 189 mg/dL — AB (ref 65–99)
GLUCOSE-CAPILLARY: 146 mg/dL — AB (ref 65–99)
GLUCOSE-CAPILLARY: 127 mg/dL — AB (ref 65–99)
Glucose-Capillary: 199 mg/dL — ABNORMAL HIGH (ref 65–99)

## 2015-08-30 MED ORDER — HYDROCORTISONE 1 % EX CREA
TOPICAL_CREAM | Freq: Two times a day (BID) | CUTANEOUS | Status: DC
  Administered 2015-08-30: 1 via TOPICAL
  Administered 2015-08-30: 22:00:00 via TOPICAL
  Filled 2015-08-30 (×2): qty 1.5

## 2015-08-30 MED ORDER — HYDROCODONE-ACETAMINOPHEN 5-325 MG PO TABS
1.0000 | ORAL_TABLET | ORAL | Status: DC | PRN
  Administered 2015-08-30 – 2015-09-01 (×6): 1 via ORAL
  Filled 2015-08-30 (×6): qty 1

## 2015-08-30 MED ORDER — HYDROCODONE-ACETAMINOPHEN 5-325 MG PO TABS
2.0000 | ORAL_TABLET | Freq: Once | ORAL | Status: AC
  Administered 2015-08-30: 2 via ORAL
  Filled 2015-08-30: qty 2

## 2015-08-30 MED ORDER — STROKE: EARLY STAGES OF RECOVERY BOOK
Freq: Once | Status: AC
  Administered 2015-08-30: 12:00:00
  Filled 2015-08-30: qty 1

## 2015-08-30 NOTE — Consult Note (Signed)
[name] A. Merlene Laughter, MD     [email or web address]          [name] [name] is an 79 y.o. female.   ASSESSMENT/PLAN: 1. Reemergence of old stroke symptoms in the setting of urinary tract infection. 2. Old lacunar infarcts. Risk factors hypertension, diabetes, dyslipidemia, coronary artery disease and the obstructive sleep apnea syndrome. 3. Extensive white matter chronic ischemic changes.   RECCOMENDATION: EEG to evaluate for unusual presentation of seizures. Additional labs. Continue with risk factor reduction and antiplatelet agents. Agree with treatment of urinary tract infection.     The patient is a 79 year old white female who developed the acute onset of left facial pain, left facial numbness associated with the dysarthria and gait instability. The patient reports that she still does not feel like herself. She reports having difficulties concentrating and saying what she wants to say. It appears patient had a similar event about 2 months ago and at that time the workup was unrevealing including MRI. She did have however urinary tract infection at that time. Workup today also shows urinary tract infection. The patient tells me that she had a stroke in 2000. This is associated with facial weakness, dysarthria and gait problems. She recovered but appears she's had recurrent symptoms similar to her initial event. She's had a few of these. Review of records indicate that her imaging has been negative when she's had these events. She is on aspirin and has been compliant with this. She continues to have light headache on the left side. Her symptoms have improved but she still not at baseline. She denies other symptoms at this time. There is no GI, GU symptoms, chest pain or shortness of breath. Review of systems otherwise negative.  GENERAL: This is a pleasant female in no acute distress.  HEENT: Supple. Atraumatic normocephalic.   ABDOMEN: soft  EXTREMITIES: No  edema   BACK: Normal.  SKIN: Red scaly lesions elbows and the also right leg apparently from psoriasis.    MENTAL STATUS: Alert and oriented. Speech, language and cognition are generally intact. Judgment and insight normal.   CRANIAL NERVES: Pupils are equal, round and reactive to light and accommodation; extra ocular movements are full, there is no significant nystagmus; visual fields are full; upper and lower facial muscles are normal in strength and symmetric, there is no flattening of the nasolabial folds; tongue is midline; uvula is midline; shoulder elevation is normal.  MOTOR: Normal tone, bulk and strength in the legs but slightly weak deltoids bilaterally 4+/5; no pronator drift.  COORDINATION: Left finger to nose is normal, right finger to nose is normal, No rest tremor; no intention tremor; no postural tremor; no bradykinesia.  REFLEXES: Deep tendon reflexes are symmetrical and normal except at the knees where they're absent. Babinski reflexes are flexor bilaterally.   SENSATION: Normal to light touch.    The patient's brain MRI is reviewed in person. 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 Nothing acute is seen on diffusion imaging. There are 2 areas of reduced signal seen on T1 suggestive of lacunar infarcts. These areas involving the right globus pallidus and the right thalamus.   Blood pressure 121/37, pulse 64, temperature 98.6 F (37 C), temperature source Oral, resp. rate 18, height $RemoveBe'5\' 2"'[id number]$  (1.575 m), weight 93.486 kg (206 lb 1.6 oz), SpO2 93 %.  Past Medical History  Diagnosis Date  . Arteriosclerotic cardiovascular disease (ASCVD) CARDIOLOGIST-   DR Lattie Haw     CABG  surgery in 2001; nl stress nuclear-2007; angiography in 01/2006- TO of LAD; 70% ostial stenosis      of a small ramus; patent grafts; normal EF; cath in 12/2009 essentially unchanged; EF-66%;  Congestive heart failure with preserved LV systolic  function  . Hypertension   . Varicose veins   . Hypothyroidism   . Insulin dependent type 2 diabetes mellitus   . Peripheral neuropathy   . Diastolic CHF   . Hyperlipidemia   . History of CVA (cerebrovascular accident)     2013--  LACUNAR INFARCTIONS RIGHT THALAMUS---  residual left side of  lip numb  and left eye vision worse (which corrented lens implant from cataract extraction)  . Lesion of bladder   . Hematuria   . Arthritis   . DDD (degenerative disc disease), lumbar   . OSA (obstructive sleep apnea)     NON-COMPLIANT CPAP  . Chronic obstructive pulmonary disease     Chronic bronchitis  . History of MI (myocardial infarction)     07/ 2001   s/p  cabg x2  . S/P CABG x 2     07-02-2000  . SUI (stress urinary incontinence, female)   . Lower urinary tract symptoms (LUTS)   . Psoriasis     lower leg  . Thinning of skin     Past Surgical History  Procedure Laterality Date  . Rotator cuff repair Right 1995  . Lumbar spine surgery  x2    yrs ago  . Carpal tunnel release Bilateral yrs ago  . Orif humerus fracture Right 04-18-2008  . Knee arthroscopy Left 10-18-2003    SYNOVECTOMY  . Repair flexor tendon hand Right 09-20-2008    CARPI RADIALIS TENDON TRANSFER TO EXTENSOR TO INDEX, MIDDLE, RING , LITTLE FINGERS AND DIGITI MININI  . Cardiovascular stress test  08-28-2011   DR ROTHBART    NEGATIVE NUCLEAR STUDY/  GLOBAL LVSF/  EF 65%  . Cholecystectomy  1990  . Appendectomy  age 66  . Total abdominal hysterectomy w/ bilateral salpingoophorectomy  age 64  . Cataract extraction w/ intraocular lens  implant, bilateral  2013  . Cardiac catheterization  1991    No sig. cad  . Coronary angioplasty  11-13-1999    balloon angioplasty to mLAD , d2 of LAD  . Coronary angioplasty with stent placement  01-17-2000    PCI stenting to mLAD & D2 of LAD  . Cardiac catheterization  07-01-2000    high grade in-stent restenosis  . Cardiac catheterization  04-30-2001    single native vessel  cad/ graft patent  . Cardiac catheterization  02-13-2006  dr Olevia Perches    2V cad with total occluded LAD  and  70% ostial of small ramus/  grafts patent/  normal ef  . Cardiac catheterization  01-20-2000  dr Angelena Form    essentially unchanged;  severe single vessel cad with diffuse disease throughout CFX and RCA/ ef 66%;  chf with preserved LVSF  . Coronary artery bypass graft  07-02-2000   DR Darylene Price    SVG to diagonal, LIMA to LAD  . Transthoracic echocardiogram  12-12-2013    MILD LVH/  EF 77-37%/  GRADE I DIASTOLIC DYSFUNCTION/  MILD LAE  . Cystoscopy w/ retrogrades Bilateral 08/09/2014    Procedure: CYSTOSCOPY, BILATERAL RETROGRADE, HYDRODISTENSION, BLADDER BIOPSY WITH FULGERATION, INSTILL PYRIDIUM AND MARCAINE ;  Surgeon: Malka So, MD;  Location: [facility];  Service: Urology;  Laterality: Bilateral;  . Abdominal hysterectomy    .  Colonoscopy N/A 07/26/2015    Procedure: COLONOSCOPY;  Surgeon: Rogene Houston, MD;  Location: [facility];  Service: Endoscopy;  Laterality: N/A;  1200    Family History  Problem Relation Age of Onset  . Coronary artery disease      Multiple first and second-degree relatives  . Aneurysm      Cerebral circulation  . Hypertension Mother   . Colon cancer Sister   . Clotting disorder      Children diagnosed with hypercoagulable state    Social History:  reports that she has never smoked. She has never used smokeless tobacco. She reports that she does not drink alcohol or use illicit drugs.  Allergies:  Allergies  Allergen Reactions  . Codeine Itching    Medications: Prior to Admission medications   Medication Sig Start Date End Date Taking? Authorizing Provider  albuterol (PROVENTIL) (2.5 MG/3ML) 0.083% nebulizer solution Take 3 mLs (2.5 mg total) by nebulization every 6 (six) hours as needed for wheezing or shortness of breath. 09/11/14  Yes Noemi Chapel, MD  aspirin EC 325 MG tablet Take 325 mg by mouth daily.   Yes  Historical Provider, MD  diazepam (VALIUM) 5 MG tablet Take 5 mg by mouth every 6 (six) hours as needed for anxiety.   Yes Historical Provider, MD  Insulin Degludec (TRESIBA FLEXTOUCH) 200 UNIT/ML SOPN Inject 35-70 Units into the skin 2 (two) times daily. 35 units in the morning and 70 units in the evening   Yes Historical Provider, MD  insulin lispro (HUMALOG) 100 UNIT/ML injection Inject 20 Units into the skin 3 (three) times daily. Or as directed per sliding scale   Yes Historical Provider, MD  levothyroxine (SYNTHROID, LEVOTHROID) 25 MCG tablet Take 25 mcg by mouth every morning.    Yes Historical Provider, MD  lisinopril (PRINIVIL,ZESTRIL) 10 MG tablet Take 1 tablet by mouth every morning.  11/09/13  Yes Historical Provider, MD  meloxicam (MOBIC) 15 MG tablet Take 15 mg by mouth daily.   Yes Historical Provider, MD  metFORMIN (GLUCOPHAGE) 500 MG tablet Take 500 mg by mouth 2 (two) times daily with a meal.     Yes Historical Provider, MD  Multiple Vitamins-Calcium (ONE-A-DAY WOMENS PO) Take 1 tablet by mouth daily.   Yes Historical Provider, MD  nitroGLYCERIN (NITROSTAT) 0.4 MG SL tablet Place 0.4 mg under the tongue every 5 (five) minutes as needed for chest pain.    Yes Historical Provider, MD  pravastatin (PRAVACHOL) 20 MG tablet Take 20 mg by mouth every evening.    Yes Historical Provider, MD  zolpidem (AMBIEN) 5 MG tablet Take 5 mg by mouth at bedtime. 08/12/14  Yes Historical Provider, MD    Scheduled Meds: . aspirin EC  325 mg Oral Daily  . cefTRIAXone (ROCEPHIN)  IV  1 g Intravenous Q24H  . enoxaparin (LOVENOX) injection  40 mg Subcutaneous Q24H  . hydrocortisone cream   Topical BID  . insulin aspart  0-20 Units Subcutaneous TID WC  . insulin aspart  0-5 Units Subcutaneous QHS  . insulin detemir  40 Units Subcutaneous BID  . levothyroxine  25 mcg Oral QAC breakfast  . lisinopril  10 mg Oral q morning - 10a  . meloxicam  15 mg Oral Daily  . metFORMIN  500 mg Oral BID WC  .  pravastatin  20 mg Oral q1800  . sodium chloride  3 mL Intravenous Q12H  . zolpidem  5 mg Oral QHS   Continuous Infusions:  PRN  Meds:.albuterol, diazepam, HYDROcodone-acetaminophen, nitroGLYCERIN, ondansetron **OR** ondansetron (ZOFRAN) IV     Results for orders placed or performed during the hospital encounter of 08/29/15 (from the past 48 hour(s))  CBG monitoring, ED     Status: Abnormal   Collection Time: 08/29/15  4:42 PM  Result Value Ref Range   Glucose-Capillary 240 (H) 65 - 99 mg/dL  Ethanol     Status: None   Collection Time: 08/29/15  5:00 PM  Result Value Ref Range   Alcohol, Ethyl (B) <5 <5 mg/dL    Comment:        LOWEST DETECTABLE LIMIT FOR SERUM ALCOHOL IS 5 mg/dL FOR MEDICAL PURPOSES ONLY   Protime-INR     Status: None   Collection Time: 08/29/15  5:00 PM  Result Value Ref Range   Prothrombin Time 13.8 11.6 - 15.2 seconds   INR 1.04 0.00 - 1.49  APTT     Status: None   Collection Time: 08/29/15  5:00 PM  Result Value Ref Range   aPTT 28 24 - 37 seconds  CBC     Status: Abnormal   Collection Time: 08/29/15  5:00 PM  Result Value Ref Range   WBC 10.7 (H) 4.0 - 10.5 K/uL   RBC 4.96 3.87 - 5.11 MIL/uL   Hemoglobin 10.6 (L) 12.0 - 15.0 g/dL   HCT 36.1 36.0 - 46.0 %   MCV 72.8 (L) 78.0 - 100.0 fL   MCH 21.4 (L) 26.0 - 34.0 pg   MCHC 29.4 (L) 30.0 - 36.0 g/dL   RDW 18.0 (H) 11.5 - 15.5 %   Platelets 300 150 - 400 K/uL    Comment: SPECIMEN CHECKED FOR CLOTS PLATELET COUNT CONFIRMED BY SMEAR LARGE PLATELETS PRESENT   Differential     Status: Abnormal   Collection Time: 08/29/15  5:00 PM  Result Value Ref Range   Neutrophils Relative % 53 43 - 77 %   Neutro Abs 5.7 1.7 - 7.7 K/uL   Lymphocytes Relative 34 12 - 46 %   Lymphs Abs 3.6 0.7 - 4.0 K/uL   Monocytes Relative 7 3 - 12 %   Monocytes Absolute 0.8 0.1 - 1.0 K/uL   Eosinophils Relative 6 (H) 0 - 5 %   Eosinophils Absolute 0.6 0.0 - 0.7 K/uL   Basophils Relative 1 0 - 1 %   Basophils Absolute 0.1  0.0 - 0.1 K/uL   RBC Morphology POLYCHROMASIA PRESENT   Comprehensive metabolic panel     Status: Abnormal   Collection Time: 08/29/15  5:00 PM  Result Value Ref Range   Sodium 138 135 - 145 mmol/L   Potassium 3.8 3.5 - 5.1 mmol/L   Chloride 106 101 - 111 mmol/L   CO2 25 22 - 32 mmol/L   Glucose, Bld 222 (H) 65 - 99 mg/dL   BUN 14 6 - 20 mg/dL   Creatinine, Ser 0.63 0.44 - 1.00 mg/dL   Calcium 8.9 8.9 - 10.3 mg/dL   Total Protein 7.3 6.5 - 8.1 g/dL   Albumin 3.8 3.5 - 5.0 g/dL   AST 21 15 - 41 U/L   ALT 20 14 - 54 U/L   Alkaline Phosphatase 79 38 - 126 U/L   Total Bilirubin 0.4 0.3 - 1.2 mg/dL   GFR calc non Af Amer >60 >60 mL/min   GFR calc Af Amer >60 >60 mL/min    Comment: (NOTE) The eGFR has been calculated using the CKD EPI equation. This calculation has not been validated  in all clinical situations. eGFR's persistently <60 mL/min signify possible Chronic Kidney Disease.    Anion gap 7 5 - 15  Sedimentation rate     Status: None   Collection Time: 08/29/15  5:00 PM  Result Value Ref Range   Sed Rate 10 0 - 22 mm/hr  I-stat troponin, ED (not at [facility])     Status: None   Collection Time: 08/29/15  5:03 PM  Result Value Ref Range   Troponin i, poc 0.00 0.00 - 0.08 ng/mL   Comment 3            Comment: Due to the release kinetics of cTnI, a negative result within the first hours of the onset of symptoms does not rule out myocardial infarction with certainty. If myocardial infarction is still suspected, repeat the test at appropriate intervals.   I-stat chem 8, ed     Status: Abnormal   Collection Time: 08/29/15  5:06 PM  Result Value Ref Range   Sodium 140 135 - 145 mmol/L   Potassium 4.0 3.5 - 5.1 mmol/L   Chloride 104 101 - 111 mmol/L   BUN 13 6 - 20 mg/dL   Creatinine, Ser 0.60 0.44 - 1.00 mg/dL   Glucose, Bld 229 (H) 65 - 99 mg/dL   Calcium, Ion 1.17 1.13 - 1.30 mmol/L   TCO2 22 0 - 100 mmol/L   Hemoglobin 12.2 12.0 - 15.0 g/dL   HCT 36.0 36.0 - 46.0 %   Urine rapid drug screen (hosp performed)not at [facility]     Status: Abnormal   Collection Time: 08/29/15  6:42 PM  Result Value Ref Range   Opiates NONE DETECTED NONE DETECTED   Cocaine NONE DETECTED NONE DETECTED   Benzodiazepines POSITIVE (A) NONE DETECTED   Amphetamines NONE DETECTED NONE DETECTED   Tetrahydrocannabinol NONE DETECTED NONE DETECTED   Barbiturates NONE DETECTED NONE DETECTED    Comment:        DRUG SCREEN FOR MEDICAL PURPOSES ONLY.  IF CONFIRMATION IS NEEDED FOR ANY PURPOSE, NOTIFY LAB WITHIN 5 DAYS.        LOWEST DETECTABLE LIMITS FOR URINE DRUG SCREEN Drug Class       Cutoff (ng/mL) Amphetamine      1000 Barbiturate      200 Benzodiazepine   683 Tricyclics       419 Opiates          300 Cocaine          300 THC              50   Urinalysis, Routine w reflex microscopic (not at [facility])     Status: Abnormal   Collection Time: 08/29/15  6:42 PM  Result Value Ref Range   Color, Urine YELLOW YELLOW   APPearance HAZY (A) CLEAR   Specific Gravity, Urine >1.030 (H) 1.005 - 1.030   pH 5.5 5.0 - 8.0   Glucose, UA >1000 (A) NEGATIVE mg/dL   Hgb urine dipstick TRACE (A) NEGATIVE   Bilirubin Urine NEGATIVE NEGATIVE   Ketones, ur NEGATIVE NEGATIVE mg/dL   Protein, ur NEGATIVE NEGATIVE mg/dL   Urobilinogen, UA 0.2 0.0 - 1.0 mg/dL   Nitrite POSITIVE (A) NEGATIVE   Leukocytes, UA MODERATE (A) NEGATIVE  Urine microscopic-add on     Status: Abnormal   Collection Time: 08/29/15  6:42 PM  Result Value Ref Range   Squamous Epithelial / LPF MANY (A) RARE   WBC, UA TOO NUMEROUS TO COUNT <3 WBC/hpf  RBC / HPF 3-6 <3 RBC/hpf   Bacteria, UA MANY (A) RARE  Glucose, capillary     Status: Abnormal   Collection Time: 08/29/15  8:30 PM  Result Value Ref Range   Glucose-Capillary 337 (H) 65 - 99 mg/dL  Comprehensive metabolic panel     Status: Abnormal   Collection Time: 08/30/15  5:51 AM  Result Value Ref Range   Sodium 141 135 - 145 mmol/L   Potassium 4.1 3.5 - 5.1 mmol/L    Chloride 107 101 - 111 mmol/L   CO2 30 22 - 32 mmol/L   Glucose, Bld 185 (H) 65 - 99 mg/dL   BUN 13 6 - 20 mg/dL   Creatinine, Ser 0.69 0.44 - 1.00 mg/dL   Calcium 9.0 8.9 - 10.3 mg/dL   Total Protein 6.1 (L) 6.5 - 8.1 g/dL   Albumin 3.2 (L) 3.5 - 5.0 g/dL   AST 15 15 - 41 U/L   ALT 17 14 - 54 U/L   Alkaline Phosphatase 71 38 - 126 U/L   Total Bilirubin 0.4 0.3 - 1.2 mg/dL   GFR calc non Af Amer >60 >60 mL/min   GFR calc Af Amer >60 >60 mL/min    Comment: (NOTE) The eGFR has been calculated using the CKD EPI equation. This calculation has not been validated in all clinical situations. eGFR's persistently <60 mL/min signify possible Chronic Kidney Disease.    Anion gap 4 (L) 5 - 15  CBC     Status: Abnormal   Collection Time: 08/30/15  5:51 AM  Result Value Ref Range   WBC 8.7 4.0 - 10.5 K/uL   RBC 4.70 3.87 - 5.11 MIL/uL   Hemoglobin 10.2 (L) 12.0 - 15.0 g/dL   HCT 34.6 (L) 36.0 - 46.0 %   MCV 73.6 (L) 78.0 - 100.0 fL   MCH 21.7 (L) 26.0 - 34.0 pg   MCHC 29.5 (L) 30.0 - 36.0 g/dL   RDW 18.0 (H) 11.5 - 15.5 %   Platelets 269 150 - 400 K/uL  Glucose, capillary     Status: Abnormal   Collection Time: 08/30/15  7:45 AM  Result Value Ref Range   Glucose-Capillary 189 (H) 65 - 99 mg/dL  Glucose, capillary     Status: Abnormal   Collection Time: 08/30/15 11:44 AM  Result Value Ref Range   Glucose-Capillary 199 (H) 65 - 99 mg/dL  Glucose, capillary     Status: Abnormal   Collection Time: 08/30/15  4:19 PM  Result Value Ref Range   Glucose-Capillary 146 (H) 65 - 99 mg/dL    Studies/Results:  BRAIN MRI 08-2015 1. No acute intracranial infarct or other process identified. 2. Generalized cerebral atrophy with moderate chronic small vessel ischemic disease.   CAROTID DOPPLERS Unremarkable.  BRAIN MRI 06-2015 done for slurred speech and dizziness was also unremarkable.    ECHO 06-2015 - Left ventricle: The cavity size was normal. There was moderate concentric  hypertrophy. Systolic function was normal. The estimated ejection fraction was in the range of 60% to 65%. Wall motion was normal; there were no regional wall motion abnormalities. Doppler parameters are consistent with abnormal left ventricular relaxation (grade 1 diastolic dysfunction). Doppler parameters are consistent with high ventricular filling pressure. - Aortic valve: Trileaflet; mildly thickened, mildly calcified leaflets. There was no stenosis. - Mitral valve: Mildly calcified annulus.  Amedio Bowlby A. Merlene [name], M.D.  Diplomate, Tax adviser of Psychiatry and Neurology ( Neurology). 08/30/2015, 7:07 PM

## 2015-08-30 NOTE — Progress Notes (Signed)
Subjective: The patient complains of numbness of her right side of face and back pain she is a diabetic had previous history of stroke MRI of brain did not show definite evidence of stroke. She does have diabetes that has been difficult to control  Objective: Vital signs in last 24 hours: Temp:  [97.6 F (36.4 C)-99.8 F (37.7 C)] 98 F (36.7 C) (09/07 1610) Pulse Rate:  [54-89] 61 (09/07 0613) Resp:  [11-24] 20 (09/07 0613) BP: (102-185)/(48-101) 165/64 mmHg (09/07 0613) SpO2:  [94 %-100 %] 96 % (09/07 0613) Weight:  [86.183 kg (190 lb)-93.486 kg (206 lb 1.6 oz)] 93.441 kg (206 lb) (09/06 2044) Weight change:  Last BM Date: 08/29/15  Intake/Output from previous day: 09/06 0701 - 09/07 0700 In: -  Out: 300 [Urine:300] Intake/Output this shift:    Physical Exam: General appearance patient is alert and oriented  HEENT negative  Neck supple no JVD or thyroid abnormalities  Heart regular rhythm no murmurs  Lungs clear to P&A  Abdomen soft no palpable organs or masses  Neurological numbness of left side of face no other acute abnormalities   Recent Labs  08/29/15 1700 08/29/15 1706  WBC 10.7*  --   HGB 10.6* 12.2  HCT 36.1 36.0  PLT 300  --    BMET  Recent Labs  08/29/15 1700 08/29/15 1706  NA 138 140  K 3.8 4.0  CL 106 104  CO2 25  --   GLUCOSE 222* 229*  BUN 14 13  CREATININE 0.63 0.60  CALCIUM 8.9  --     Studies/Results: Ct Head Wo Contrast  08/29/2015   CLINICAL DATA:  Left facial numbness, left-sided blurry vision.  EXAM: CT HEAD WITHOUT CONTRAST  TECHNIQUE: Contiguous axial images were obtained from the base of the skull through the vertex without intravenous contrast.  COMPARISON:  CT scan of head of July 18, 2015.  FINDINGS: Bony calvarium appears intact. Mild diffuse cortical atrophy is noted. Moderate chronic ischemic white matter disease is noted. No mass effect or midline shift is noted. Ventricular size is within normal limits. There is no  evidence of mass lesion, hemorrhage or acute infarction.  IMPRESSION: Mild diffuse cortical atrophy. Moderate chronic ischemic white matter disease. No acute intracranial abnormality seen. These results were called by telephone at the time of interpretation on 08/29/2015 at 5:21 pm to Dr. Geoffery Lyons , who verbally acknowledged these results.   Electronically Signed   By: Lupita Raider, M.D.   On: 08/29/2015 17:23   [name] Contrast  08/29/2015   CLINICAL DATA:  Initial evaluation for new onset left-sided numbness.  EXAM: MRI HEAD WITHOUT CONTRAST  TECHNIQUE: Multiplanar, multiecho pulse sequences of the brain and surrounding structures were obtained without intravenous contrast.  COMPARISON:  CT from earlier the same day as well as previous MRI from 09/27/2015.  FINDINGS: No abnormal foci of restricted diffusion to suggest acute intracranial infarct identified. Gray-white matter differentiation maintained. Normal intravascular flow voids are maintained. No acute or chronic intracranial hemorrhage.  Generalized cerebral atrophy with moderate chronic small vessel ischemic type changes present within the periventricular and deep white matter both cerebral hemispheres. Small vessel disease present within the central pons as well. Probable small remote lacunar infarct within the right thalamus noted.  No mass lesion, midline shift or mass effect. No hydrocephalus. No extra-axial fluid collection.  Craniocervical junction within normal limits. Incidental note made of a partially empty sella.  No acute abnormality about the orbits. Sequelae  of prior bilateral cataract extraction noted. Scattered mucosal thickening present within the frontal sinuses, ethmoidal air cells, and maxillary sinuses. No air-fluid levels to suggest active sinus infection. Scattered opacity present within the mastoid air cells bilaterally. Inner ear structures grossly normal.  Bone marrow signal intensity within normal limits. Subcentimeter  well-circumscribed round FLAIR hyperintense focus within the right parietal calvarium is stable, and of doubtful clinical significance. No scalp soft tissue abnormality.  IMPRESSION: 1. No acute intracranial infarct or other process identified. 2. Generalized cerebral atrophy with moderate chronic small vessel ischemic disease.   Electronically Signed   By: Rise Mu M.D.   On: 08/29/2015 21:53    Medications:  . aspirin EC  325 mg Oral Daily  . cefTRIAXone (ROCEPHIN)  IV  1 [name] Intravenous Q24H  . enoxaparin (LOVENOX) injection  40 mg Subcutaneous Q24H  . hydrocortisone cream   Topical BID  . insulin aspart  0-20 Units Subcutaneous TID WC  . insulin aspart  0-5 Units Subcutaneous QHS  . insulin detemir  40 Units Subcutaneous BID  . levothyroxine  25 mcg Oral QAC breakfast  . lisinopril  10 mg Oral q morning - 10a  . meloxicam  15 mg Oral Daily  . metFORMIN  500 mg Oral BID WC  . pravastatin  20 mg Oral q1800  . sodium chloride  3 mL Intravenous Q12H  . zolpidem  5 mg Oral QHS        Assessment/Plan: Left facial numbness-admitted to rule out CVA with MRI brain scan and carotid Dopplers-neurology consult requested  2. Diabetes mellitus with hyperglycemia to continue sliding scale insulin  3 essential hypertension stable continue current medication   LOS: 1 day   [name] [name] 08/30/2015, 7:19 AM

## 2015-08-31 ENCOUNTER — Inpatient Hospital Stay (HOSPITAL_COMMUNITY)
Admit: 2015-08-31 | Discharge: 2015-08-31 | Disposition: A | Discharge status: Home / Self Care | Payer: Medicare HMO | Attending: Neurology | Admitting: Neurology

## 2015-08-31 LAB — GLUCOSE, CAPILLARY
GLUCOSE-CAPILLARY: 144 mg/dL — AB (ref 65–99)
GLUCOSE-CAPILLARY: 151 mg/dL — AB (ref 65–99)
GLUCOSE-CAPILLARY: 142 mg/dL — AB (ref 65–99)
Glucose-Capillary: 158 mg/dL — ABNORMAL HIGH (ref 65–99)

## 2015-08-31 LAB — SEDIMENTATION RATE: SED RATE: 3 mm/h (ref 0–22)

## 2015-08-31 LAB — TSH: TSH: 1.291 u[IU]/mL (ref 0.350–4.500)

## 2015-08-31 LAB — VITAMIN B12: VITAMIN B 12: 150 pg/mL — AB (ref 180–914)

## 2015-08-31 MED ORDER — HYDROCORTISONE 1 % EX LOTN
TOPICAL_LOTION | Freq: Two times a day (BID) | CUTANEOUS | Status: DC
  Filled 2015-08-31: qty 118

## 2015-08-31 MED ORDER — HYDROCORTISONE 1 % EX CREA
TOPICAL_CREAM | Freq: Two times a day (BID) | CUTANEOUS | Status: DC
  Administered 2015-08-31 – 2015-09-01 (×3): via TOPICAL
  Filled 2015-08-31 (×2): qty 28

## 2015-08-31 NOTE — Procedures (Signed)
[facility] Kiyara Bouffard A. Gerilyn Pilgrim, MD     [email or web address]           HISTORY: The patient is a 79 year old who presents with confusion and altered mental status for these events happen episodically worrisome for seizures.  MEDICATIONS: Scheduled Meds: . aspirin EC  325 mg Oral Daily  . cefTRIAXone (ROCEPHIN)  IV  1 g Intravenous Q24H  . enoxaparin (LOVENOX) injection  40 mg Subcutaneous Q24H  . hydrocortisone cream   Topical BID  . insulin aspart  0-20 Units Subcutaneous TID WC  . insulin aspart  0-5 Units Subcutaneous QHS  . insulin detemir  40 Units Subcutaneous BID  . levothyroxine  25 mcg Oral QAC breakfast  . lisinopril  10 mg Oral q morning - 10a  . meloxicam  15 mg Oral Daily  . metFORMIN  500 mg Oral BID WC  . pravastatin  20 mg Oral q1800  . sodium chloride  3 mL Intravenous Q12H  . zolpidem  5 mg Oral QHS   Continuous Infusions:  PRN Meds:.albuterol, diazepam, HYDROcodone-acetaminophen, nitroGLYCERIN, ondansetron **OR** ondansetron (ZOFRAN) IV  Prior to Admission medications   Medication Sig Start Date End Date Taking? Authorizing Provider  albuterol (PROVENTIL) (2.5 MG/3ML) 0.083% nebulizer solution Take 3 mLs (2.5 mg total) by nebulization every 6 (six) hours as needed for wheezing or shortness of breath. 09/11/14  Yes Eber Hong, MD  aspirin EC 325 MG tablet Take 325 mg by mouth daily.   Yes Historical Provider, MD  diazepam (VALIUM) 5 MG tablet Take 5 mg by mouth every 6 (six) hours as needed for anxiety.   Yes Historical Provider, MD  Insulin Degludec (TRESIBA FLEXTOUCH) 200 UNIT/ML SOPN Inject 35-70 Units into the skin 2 (two) times daily. 35 units in the morning and 70 units in the evening   Yes Historical Provider, MD  insulin lispro (HUMALOG) 100 UNIT/ML injection Inject 20 Units into the skin 3 (three) times daily. Or as directed per sliding scale   Yes Historical Provider, MD  levothyroxine (SYNTHROID, LEVOTHROID) 25 MCG tablet Take 25 mcg by  mouth every morning.    Yes Historical Provider, MD  lisinopril (PRINIVIL,ZESTRIL) 10 MG tablet Take 1 tablet by mouth every morning.  11/09/13  Yes Historical Provider, MD  meloxicam (MOBIC) 15 MG tablet Take 15 mg by mouth daily.   Yes Historical Provider, MD  metFORMIN (GLUCOPHAGE) 500 MG tablet Take 500 mg by mouth 2 (two) times daily with a meal.     Yes Historical Provider, MD  Multiple Vitamins-Calcium (ONE-A-DAY WOMENS PO) Take 1 tablet by mouth daily.   Yes Historical Provider, MD  nitroGLYCERIN (NITROSTAT) 0.4 MG SL tablet Place 0.4 mg under the tongue every 5 (five) minutes as needed for chest pain.    Yes Historical Provider, MD  pravastatin (PRAVACHOL) 20 MG tablet Take 20 mg by mouth every evening.    Yes Historical Provider, MD  zolpidem (AMBIEN) 5 MG tablet Take 5 mg by mouth at bedtime. 08/12/14  Yes Historical Provider, MD      ANALYSIS: A 16 channel recording using standard 10 20 measurements is conducted for 22 minutes. There is a well-formed posterior dominant rhythm of 9 Hz which attenuates with eye opening. There is beta activity observed in the frontal areas. Awake and sleep architecture is observed. K complexes and prominent sleep spindles are seen. Photic stimulation and hyperventilation are not carried out. There is no focal or lateral slowing. There is no epileptiform activity is observed.  IMPRESSION: This is a normal recording of the awake and sleep states.      Senai Ramnath A. Gerilyn Pilgrim, M.D.  Diplomate, Biomedical engineer of Psychiatry and Neurology ( Neurology).

## 2015-08-31 NOTE — Progress Notes (Signed)
Subjective: The patient is complaining of numbness in his side of her face and back pain. She is diabetic who was felt to admission she experienced a stroke but MRI did not show definite evidence of this.  Objective: Vital signs in last 24 hours: Temp:  [97.9 F (36.6 C)-98.9 F (37.2 C)] 98.4 F (36.9 C) (09/08 4098) Pulse Rate:  [63-74] 74 (09/08 0613) Resp:  [18-20] 20 (09/08 1191) BP: (121-139)/(23-104) 139/54 mmHg (09/08 0613) SpO2:  [90 %-96 %] 90 % (09/08 4782) Weight change:  Last BM Date: 08/29/15  Intake/Output from previous day: 09/07 0701 - 09/08 0700 In: 600 [P.O.:600] Out: 1450 [Urine:1450] Intake/Output this shift: Total I/O In: 240 [P.O.:240] Out: 600 [Urine:600]  Physical Exam: General appearance patient is alert and oriented complaining of back pain  HEENT negative  Neck supple no JVD or thyroid abnormalities  Heart regular rhythm no murmurs  Lungs clear to P&A  Abdomen soft no palpable organs or masses  Neurological no numbness of her face no other acute abnormalities   Recent Labs  08/29/15 1700 08/29/15 1706 08/30/15 0551  WBC 10.7*  --  8.7  HGB 10.6* 12.2 10.2*  HCT 36.1 36.0 34.6*  PLT 300  --  269   BMET  Recent Labs  08/29/15 1700 08/29/15 1706 08/30/15 0551  NA 138 140 141  K 3.8 4.0 4.1  CL 106 104 107  CO2 25  --  30  GLUCOSE 222* 229* 185*  BUN 14 13 13   CREATININE 0.63 0.60 0.69  CALCIUM 8.9  --  9.0    Studies/Results: Ct Head Wo Contrast  08/29/2015   CLINICAL DATA:  Left facial numbness, left-sided blurry vision.  EXAM: CT HEAD WITHOUT CONTRAST  TECHNIQUE: Contiguous axial images were obtained from the base of the skull through the vertex without intravenous contrast.  COMPARISON:  CT scan of head of July 18, 2015.  FINDINGS: Bony calvarium appears intact. Mild diffuse cortical atrophy is noted. Moderate chronic ischemic white matter disease is noted. No mass effect or midline shift is noted. Ventricular size is  within normal limits. There is no evidence of mass lesion, hemorrhage or acute infarction.  IMPRESSION: Mild diffuse cortical atrophy. Moderate chronic ischemic white matter disease. No acute intracranial abnormality seen. These results were called by telephone at the time of interpretation on 08/29/2015 at 5:21 pm to Dr. Geoffery Lyons , who verbally acknowledged these results.   Electronically Signed   By: Lupita Raider, M.D.   On: 08/29/2015 17:23   [name] Contrast  08/29/2015   CLINICAL DATA:  Initial evaluation for new onset left-sided numbness.  EXAM: MRI HEAD WITHOUT CONTRAST  TECHNIQUE: Multiplanar, multiecho pulse sequences of the brain and surrounding structures were obtained without intravenous contrast.  COMPARISON:  CT from earlier the same day as well as previous MRI from 09/27/2015.  FINDINGS: No abnormal foci of restricted diffusion to suggest acute intracranial infarct identified. Gray-white matter differentiation maintained. Normal intravascular flow voids are maintained. No acute or chronic intracranial hemorrhage.  Generalized cerebral atrophy with moderate chronic small vessel ischemic type changes present within the periventricular and deep white matter both cerebral hemispheres. Small vessel disease present within the central pons as well. Probable small remote lacunar infarct within the right thalamus noted.  No mass lesion, midline shift or mass effect. No hydrocephalus. No extra-axial fluid collection.  Craniocervical junction within normal limits. Incidental note made of a partially empty sella.  No acute abnormality about the orbits.  Sequelae of prior bilateral cataract extraction noted. Scattered mucosal thickening present within the frontal sinuses, ethmoidal air cells, and maxillary sinuses. No air-fluid levels to suggest active sinus infection. Scattered opacity present within the mastoid air cells bilaterally. Inner ear structures grossly normal.  Bone marrow signal intensity  within normal limits. Subcentimeter well-circumscribed round FLAIR hyperintense focus within the right parietal calvarium is stable, and of doubtful clinical significance. No scalp soft tissue abnormality.  IMPRESSION: 1. No acute intracranial infarct or other process identified. 2. Generalized cerebral atrophy with moderate chronic small vessel ischemic disease.   Electronically Signed   By: Rise Mu M.D.   On: 08/29/2015 21:53   US Carotid Bilateral  08/30/2015   CLINICAL DATA:  Left facial numbness  EXAM: BILATERAL CAROTID DUPLEX ULTRASOUND  TECHNIQUE: Wallace Cullens scale imaging, color Doppler and duplex ultrasound were performed of bilateral carotid and vertebral arteries in the neck.  COMPARISON:  06/27/2015  FINDINGS: Criteria: Quantification of carotid stenosis is based on velocity parameters that correlate the residual internal carotid diameter with NASCET-based stenosis levels, using the diameter of the distal internal carotid lumen as the denominator for stenosis measurement.  The following velocity measurements were obtained:  RIGHT  ICA:  73 cm/sec  CCA:  87 cm/sec  SYSTOLIC ICA/CCA RATIO:  0.8  DIASTOLIC ICA/CCA RATIO:  0.8  ECA:  83 cm/sec  LEFT  ICA:  92 cm/sec  CCA:  67 cm/sec  SYSTOLIC ICA/CCA RATIO:  1.4  DIASTOLIC ICA/CCA RATIO:  1.3  ECA:  84 cm/sec  RIGHT CAROTID ARTERY: Mild mixed plaque in the bulb. Low resistance internal carotid Doppler pattern.  RIGHT VERTEBRAL ARTERY:  Antegrade with a normal Doppler pattern.  LEFT CAROTID ARTERY: Mild mixed plaque in the bulb. Low resistance internal carotid Doppler pattern.  LEFT VERTEBRAL ARTERY:  Antegrade with a normal Doppler pattern.  IMPRESSION: Less than 50% stenosis in the right and left internal carotid arteries.   Electronically Signed   By: Jolaine Click M.D.   On: 08/30/2015 12:24    Medications:  . aspirin EC  325 mg Oral Daily  . cefTRIAXone (ROCEPHIN)  IV  1 g Intravenous Q24H  . enoxaparin (LOVENOX) injection  40 mg  Subcutaneous Q24H  . hydrocortisone cream   Topical BID  . insulin aspart  0-20 Units Subcutaneous TID WC  . insulin aspart  0-5 Units Subcutaneous QHS  . insulin detemir  40 Units Subcutaneous BID  . levothyroxine  25 mcg Oral QAC breakfast  . lisinopril  10 mg Oral q morning - 10a  . meloxicam  15 mg Oral Daily  . metFORMIN  500 mg Oral BID WC  . pravastatin  20 mg Oral q1800  . sodium chloride  3 mL Intravenous Q12H  . zolpidem  5 mg Oral QHS        Assessment/Plan: 1. Left facial numbness-admitted to rule out CVA MRI brain scan negative carotid Dopplers and 2-D echo essentially negative-EEG ordered by neurology for today  2. Diabetes mellitus with hyperglycemia in better control continue basal insulin and sliding scale NovoLog insulin  3. Essential hypertension stable to continue with current medication   LOS: 2 days   Ardra Kuznicki G 08/31/2015, 6:39 AM

## 2015-08-31 NOTE — Progress Notes (Signed)
Patient ID: [name] [name], female   DOB: [birth year], 78 y.o.   MRN: [id number]  [place] A. Merlene Laughter, MD     [email or web address]          [name] [name] is an 79 y.o. female.   Assessment/Plan: 1. Reemergence of old stroke symptoms in the setting of urinary tract infection. 2. Old lacunar infarcts. Risk factors hypertension, diabetes, dyslipidemia, coronary artery disease and the obstructive sleep apnea syndrome. 3. Extensive white matter chronic ischemic changes.   RECCOMENDATION: Continue with risk factor reduction and antiplatelet agents. Agree with treatment of urinary tract infection.   No new complaints.   GENERAL: This is a pleasant female in no acute distress.  HEENT: Supple. Atraumatic normocephalic.   EXTREMITIES: No edema   BACK: Normal.  SKIN: Red scaly lesions elbows and the also right leg apparently from psoriasis.   MENTAL STATUS: Alert and oriented. Speech, language and cognition are generally intact. Judgment and insight normal.   CRANIAL NERVES: Pupils are equal, round and reactive to light and accommodation; extra ocular movements are full, there is no significant nystagmus; visual fields are full; upper and lower facial muscles are normal in strength and symmetric, there is no flattening of the nasolabial folds; tongue is midline; uvula is midline; shoulder elevation is normal.  MOTOR: Normal tone, bulk and strength in the legs but slightly weak deltoids bilaterally 4+/5; no pronator drift.  COORDINATION: Left finger to nose is normal, right finger to nose is normal, No rest tremor; no intention tremor; no postural tremor; no bradykinesia.     EEG Unremarkable.   Objective: Vital signs in last 24 hours: Temp:  [98.2 F (36.8 C)-100 F (37.8 C)] 98.2 F (36.8 C) (09/08 1633) Pulse Rate:  [64-77] 65 (09/08 1633) Resp:  [18-20] 20 (09/08 1633) BP: (121-149)/(23-54) 149/49 mmHg (09/08 1633) SpO2:  [90 %-96 %] 91 % (09/08  1633)  Intake/Output from previous day: 09/07 0701 - 09/08 0700 In: 600 [P.O.:600] Out: 1450 [Urine:1450] Intake/Output this shift: Total I/O In: 120 [P.O.:120] Out: -  Nutritional status: Diet Carb Modified Fluid consistency:: Thin; Room service appropriate?: Yes   Lab Results: Results for orders placed or performed during the hospital encounter of 08/29/15 (from the past 48 hour(s))  Urine rapid drug screen (hosp performed)not at [facility]     Status: Abnormal   Collection Time: 08/29/15  6:42 PM  Result Value Ref Range   Opiates NONE DETECTED NONE DETECTED   Cocaine NONE DETECTED NONE DETECTED   Benzodiazepines POSITIVE (A) NONE DETECTED   Amphetamines NONE DETECTED NONE DETECTED   Tetrahydrocannabinol NONE DETECTED NONE DETECTED   Barbiturates NONE DETECTED NONE DETECTED    Comment:        DRUG SCREEN FOR MEDICAL PURPOSES ONLY.  IF CONFIRMATION IS NEEDED FOR ANY PURPOSE, NOTIFY LAB WITHIN 5 DAYS.        LOWEST DETECTABLE LIMITS FOR URINE DRUG SCREEN Drug Class       Cutoff (ng/mL) Amphetamine      1000 Barbiturate      200 Benzodiazepine   497 Tricyclics       026 Opiates          300 Cocaine          300 THC              50   Urinalysis, Routine w reflex microscopic (not at [facility])     Status: Abnormal   Collection Time: 08/29/15  6:42 PM  Result  Value Ref Range   Color, Urine YELLOW YELLOW   APPearance HAZY (A) CLEAR   Specific Gravity, Urine >1.030 (H) 1.005 - 1.030   pH 5.5 5.0 - 8.0   Glucose, UA >1000 (A) NEGATIVE mg/dL   Hgb urine dipstick TRACE (A) NEGATIVE   Bilirubin Urine NEGATIVE NEGATIVE   Ketones, ur NEGATIVE NEGATIVE mg/dL   Protein, ur NEGATIVE NEGATIVE mg/dL   Urobilinogen, UA 0.2 0.0 - 1.0 mg/dL   Nitrite POSITIVE (A) NEGATIVE   Leukocytes, UA MODERATE (A) NEGATIVE  Urine microscopic-add on     Status: Abnormal   Collection Time: 08/29/15  6:42 PM  Result Value Ref Range   Squamous Epithelial / LPF MANY (A) RARE   WBC, UA TOO NUMEROUS TO  COUNT <3 WBC/hpf   RBC / HPF 3-6 <3 RBC/hpf   Bacteria, UA MANY (A) RARE  Glucose, capillary     Status: Abnormal   Collection Time: 08/29/15  8:30 PM  Result Value Ref Range   Glucose-Capillary 337 (H) 65 - 99 mg/dL  Comprehensive metabolic panel     Status: Abnormal   Collection Time: 08/30/15  5:51 AM  Result Value Ref Range   Sodium 141 135 - 145 mmol/L   Potassium 4.1 3.5 - 5.1 mmol/L   Chloride 107 101 - 111 mmol/L   CO2 30 22 - 32 mmol/L   Glucose, Bld 185 (H) 65 - 99 mg/dL   BUN 13 6 - 20 mg/dL   Creatinine, Ser 0.69 0.44 - 1.00 mg/dL   Calcium 9.0 8.9 - 10.3 mg/dL   Total Protein 6.1 (L) 6.5 - 8.1 g/dL   Albumin 3.2 (L) 3.5 - 5.0 g/dL   AST 15 15 - 41 U/L   ALT 17 14 - 54 U/L   Alkaline Phosphatase 71 38 - 126 U/L   Total Bilirubin 0.4 0.3 - 1.2 mg/dL   GFR calc non Af Amer >60 >60 mL/min   GFR calc Af Amer >60 >60 mL/min    Comment: (NOTE) The eGFR has been calculated using the CKD EPI equation. This calculation has not been validated in all clinical situations. eGFR's persistently <60 mL/min signify possible Chronic Kidney Disease.    Anion gap 4 (L) 5 - 15  CBC     Status: Abnormal   Collection Time: 08/30/15  5:51 AM  Result Value Ref Range   WBC 8.7 4.0 - 10.5 K/uL   RBC 4.70 3.87 - 5.11 MIL/uL   Hemoglobin 10.2 (L) 12.0 - 15.0 g/dL   HCT 34.6 (L) 36.0 - 46.0 %   MCV 73.6 (L) 78.0 - 100.0 fL   MCH 21.7 (L) 26.0 - 34.0 pg   MCHC 29.5 (L) 30.0 - 36.0 g/dL   RDW 18.0 (H) 11.5 - 15.5 %   Platelets 269 150 - 400 K/uL  Glucose, capillary     Status: Abnormal   Collection Time: 08/30/15  7:45 AM  Result Value Ref Range   Glucose-Capillary 189 (H) 65 - 99 mg/dL  Glucose, capillary     Status: Abnormal   Collection Time: 08/30/15 11:44 AM  Result Value Ref Range   Glucose-Capillary 199 (H) 65 - 99 mg/dL  Glucose, capillary     Status: Abnormal   Collection Time: 08/30/15  4:19 PM  Result Value Ref Range   Glucose-Capillary 146 (H) 65 - 99 mg/dL  Glucose,  capillary     Status: Abnormal   Collection Time: 08/30/15  8:47 PM  Result Value Ref Range  Glucose-Capillary 127 (H) 65 - 99 mg/dL   Comment 1 Notify RN    Comment 2 Document in Chart   Vitamin B12     Status: Abnormal   Collection Time: 08/31/15  5:30 AM  Result Value Ref Range   Vitamin B-12 150 (L) 180 - 914 pg/mL    Comment: (NOTE) This assay is not validated for testing neonatal or myeloproliferative syndrome specimens for Vitamin B12 levels. Performed at [facility]   TSH     Status: None   Collection Time: 08/31/15  5:30 AM  Result Value Ref Range   TSH 1.291 0.350 - 4.500 uIU/mL  Sedimentation rate     Status: None   Collection Time: 08/31/15  5:30 AM  Result Value Ref Range   Sed Rate 3 0 - 22 mm/hr  Glucose, capillary     Status: Abnormal   Collection Time: 08/31/15  7:38 AM  Result Value Ref Range   Glucose-Capillary 144 (H) 65 - 99 mg/dL  Glucose, capillary     Status: Abnormal   Collection Time: 08/31/15 11:17 AM  Result Value Ref Range   Glucose-Capillary 151 (H) 65 - 99 mg/dL  Glucose, capillary     Status: Abnormal   Collection Time: 08/31/15  4:40 PM  Result Value Ref Range   Glucose-Capillary 142 (H) 65 - 99 mg/dL   Comment 1 Notify RN    Comment 2 Document in Chart     Lipid Panel No results for input(s): CHOL, TRIG, HDL, CHOLHDL, VLDL, LDLCALC in the last 72 hours.  Studies/Results:   Medications:  Scheduled Meds: . aspirin EC  325 mg Oral Daily  . cefTRIAXone (ROCEPHIN)  IV  1 g Intravenous Q24H  . enoxaparin (LOVENOX) injection  40 mg Subcutaneous Q24H  . hydrocortisone cream   Topical BID  . insulin aspart  0-20 Units Subcutaneous TID WC  . insulin aspart  0-5 Units Subcutaneous QHS  . insulin detemir  40 Units Subcutaneous BID  . levothyroxine  25 mcg Oral QAC breakfast  . lisinopril  10 mg Oral q morning - 10a  . meloxicam  15 mg Oral Daily  . metFORMIN  500 mg Oral BID WC  . pravastatin  20 mg Oral q1800  . sodium  chloride  3 mL Intravenous Q12H  . zolpidem  5 mg Oral QHS   Continuous Infusions:  PRN Meds:.albuterol, diazepam, HYDROcodone-acetaminophen, nitroGLYCERIN, ondansetron **OR** ondansetron (ZOFRAN) IV     LOS: 2 days   Emalia Witkop A. Merlene Laughter, M.D.  Diplomate, Tax adviser of Psychiatry and Neurology ( Neurology).

## 2015-08-31 NOTE — Progress Notes (Signed)
EEG Completed; Results Pending  

## 2015-09-01 LAB — HOMOCYSTEINE: Homocysteine: 9.5 umol/L (ref 0.0–15.0)

## 2015-09-01 LAB — GLUCOSE, CAPILLARY
GLUCOSE-CAPILLARY: 167 mg/dL — AB (ref 65–99)
GLUCOSE-CAPILLARY: 149 mg/dL — AB (ref 65–99)
Glucose-Capillary: 188 mg/dL — ABNORMAL HIGH (ref 65–99)

## 2015-09-01 LAB — C-REACTIVE PROTEIN

## 2015-09-01 NOTE — Discharge Summary (Signed)
Physician Discharge Summary  Cristen Bredeson Kiper GNF:[id number] DOB: 1936/07/23 DOA: 08/29/2015  PCP: Alice Reichert, MD  Admit date: 08/29/2015 Discharge date: 09/01/2015   Follow-up Information    Follow up with Alice Reichert, MD In 1 week.   Specialty:  Family Medicine   Contact information:   [address] [place] [address] [phone]       Discharge Diagnoses:  1. Right facial numbness possible TIA 2. Diabetes mellitus insulin-dependent 3. Hypertension essential 4. Urinary tract infection 5. Hypothyroidism 6. Old lacunar infarcts 7.   Discharge Condition: Stable Disposition: Home  Diet recommendation: 2000-calorie ADA diet  Filed Weights   08/29/15 1645 08/29/15 1857  Weight: 86.183 kg (190 lb) 93.486 kg (206 lb 1.6 oz)    History of present illness:  The patient has a history of previous stroke. She had sudden onset of numbness of the left side of her face but no weakness in her arms or legs she did report some blurred vision in left eye she was seen initially in ED and subsequently was admitted. She does have a prior history of long-standing diabetes which is been difficult to control  Hospital Course:  Examination on admission revealed alert female complaining of numbness of her right side of face. HEENT was essentially negative, neck supple no JVD or thyroid abnormalities, heart regular rhythm no murmurs, lungs clear to P&A, abdomen negative, neurological right-sided face somewhat numb no other neurological abnormalities-the patient because of evidence of urinary tract infection was started on IV Rocephin 1 g daily  She is continued on aspirin 325 mg daily Lovenox Synthroid 25 g daily and metformin. She was also continued on Humalog insulin 20 units 3 times a day and basal insulin 35 units each a.m. and 70 units each p.m. Patient had MRI of brain carotid Doppler ultrasound these did not show evidence of acute infarction has felt that patient did not have an  acute stroke. She was seen in consultation by neurology who ordered EEG which was essentially negative. Felt patient could be discharged and followed as an outpatient she was ambulatory in room   Discharge Instructions The patient was advised to continue medications listed below. She was also advised to make appointment with primary care physician in one week for follow-up visit. Patient was given prescription for Vicodin    Medication List    TAKE these medications        albuterol (2.5 MG/3ML) 0.083% nebulizer solution  Commonly known as:  PROVENTIL  Take 3 mLs (2.5 mg total) by nebulization every 6 (six) hours as needed for wheezing or shortness of breath.     aspirin EC 325 MG tablet  Take 325 mg by mouth daily.     diazepam 5 MG tablet  Commonly known as:  VALIUM  Take 5 mg by mouth every 6 (six) hours as needed for anxiety.     insulin lispro 100 UNIT/ML injection  Commonly known as:  HUMALOG  Inject 20 Units into the skin 3 (three) times daily. Or as directed per sliding scale     levothyroxine 25 MCG tablet  Commonly known as:  SYNTHROID, LEVOTHROID  Take 25 mcg by mouth every morning.     lisinopril 10 MG tablet  Commonly known as:  PRINIVIL,ZESTRIL  Take 1 tablet by mouth every morning.     meloxicam 15 MG tablet  Commonly known as:  MOBIC  Take 15 mg by mouth daily.     metFORMIN 500 MG tablet  Commonly  known as:  GLUCOPHAGE  Take 500 mg by mouth 2 (two) times daily with a meal.     nitroGLYCERIN 0.4 MG SL tablet  Commonly known as:  NITROSTAT  Place 0.4 mg under the tongue every 5 (five) minutes as needed for chest pain.     ONE-A-DAY WOMENS PO  Take 1 tablet by mouth daily.     pravastatin 20 MG tablet  Commonly known as:  PRAVACHOL  Take 20 mg by mouth every evening.     TRESIBA FLEXTOUCH 200 UNIT/ML Sopn  Generic drug:  Insulin Degludec  Inject 35-70 Units into the skin 2 (two) times daily. 35 units in the morning and 70 units in the evening      zolpidem 5 MG tablet  Commonly known as:  AMBIEN  Take 5 mg by mouth at bedtime.       Allergies  Allergen Reactions  . Codeine Itching    The results of significant diagnostics from this hospitalization (including imaging, microbiology, ancillary and laboratory) are listed below for reference.    Significant Diagnostic Studies: Ct Head Wo Contrast  08/29/2015   CLINICAL DATA:  Left facial numbness, left-sided blurry vision.  EXAM: CT HEAD WITHOUT CONTRAST  TECHNIQUE: Contiguous axial images were obtained from the base of the skull through the vertex without intravenous contrast.  COMPARISON:  CT scan of head of July 18, 2015.  FINDINGS: Bony calvarium appears intact. Mild diffuse cortical atrophy is noted. Moderate chronic ischemic white matter disease is noted. No mass effect or midline shift is noted. Ventricular size is within normal limits. There is no evidence of mass lesion, hemorrhage or acute infarction.  IMPRESSION: Mild diffuse cortical atrophy. Moderate chronic ischemic white matter disease. No acute intracranial abnormality seen. These results were called by telephone at the time of interpretation on 08/29/2015 at 5:21 pm to Dr. Geoffery Lyons , who verbally acknowledged these results.   Electronically Signed   By: Lupita Raider, M.D.   On: 08/29/2015 17:23   [name] Contrast  08/29/2015   CLINICAL DATA:  Initial evaluation for new onset left-sided numbness.  EXAM: MRI HEAD WITHOUT CONTRAST  TECHNIQUE: Multiplanar, multiecho pulse sequences of the brain and surrounding structures were obtained without intravenous contrast.  COMPARISON:  CT from earlier the same day as well as previous MRI from 09/27/2015.  FINDINGS: No abnormal foci of restricted diffusion to suggest acute intracranial infarct identified. Gray-white matter differentiation maintained. Normal intravascular flow voids are maintained. No acute or chronic intracranial hemorrhage.  Generalized cerebral atrophy with moderate  chronic small vessel ischemic type changes present within the periventricular and deep white matter both cerebral hemispheres. Small vessel disease present within the central pons as well. Probable small remote lacunar infarct within the right thalamus noted.  No mass lesion, midline shift or mass effect. No hydrocephalus. No extra-axial fluid collection.  Craniocervical junction within normal limits. Incidental note made of a partially empty sella.  No acute abnormality about the orbits. Sequelae of prior bilateral cataract extraction noted. Scattered mucosal thickening present within the frontal sinuses, ethmoidal air cells, and maxillary sinuses. No air-fluid levels to suggest active sinus infection. Scattered opacity present within the mastoid air cells bilaterally. Inner ear structures grossly normal.  Bone marrow signal intensity within normal limits. Subcentimeter well-circumscribed round FLAIR hyperintense focus within the right parietal calvarium is stable, and of doubtful clinical significance. No scalp soft tissue abnormality.  IMPRESSION: 1. No acute intracranial infarct or other process identified. 2. Generalized  cerebral atrophy with moderate chronic small vessel ischemic disease.   Electronically Signed   By: Rise Mu M.D.   On: 08/29/2015 21:53   US Carotid Bilateral  08/30/2015   CLINICAL DATA:  Left facial numbness  EXAM: BILATERAL CAROTID DUPLEX ULTRASOUND  TECHNIQUE: Wallace Cullens scale imaging, color Doppler and duplex ultrasound were performed of bilateral carotid and vertebral arteries in the neck.  COMPARISON:  06/27/2015  FINDINGS: Criteria: Quantification of carotid stenosis is based on velocity parameters that correlate the residual internal carotid diameter with NASCET-based stenosis levels, using the diameter of the distal internal carotid lumen as the denominator for stenosis measurement.  The following velocity measurements were obtained:  RIGHT  ICA:  73 cm/sec  CCA:  87 cm/sec   SYSTOLIC ICA/CCA RATIO:  0.8  DIASTOLIC ICA/CCA RATIO:  0.8  ECA:  83 cm/sec  LEFT  ICA:  92 cm/sec  CCA:  67 cm/sec  SYSTOLIC ICA/CCA RATIO:  1.4  DIASTOLIC ICA/CCA RATIO:  1.3  ECA:  84 cm/sec  RIGHT CAROTID ARTERY: Mild mixed plaque in the bulb. Low resistance internal carotid Doppler pattern.  RIGHT VERTEBRAL ARTERY:  Antegrade with a normal Doppler pattern.  LEFT CAROTID ARTERY: Mild mixed plaque in the bulb. Low resistance internal carotid Doppler pattern.  LEFT VERTEBRAL ARTERY:  Antegrade with a normal Doppler pattern.  IMPRESSION: Less than 50% stenosis in the right and left internal carotid arteries.   Electronically Signed   By: Jolaine Click M.D.   On: 08/30/2015 12:24    Microbiology: No results found for this or any previous visit (from the past 240 hour(s)).   Labs: Basic Metabolic Panel:  Recent Labs Lab 08/29/15 1700 08/29/15 1706 08/30/15 0551  NA 138 140 141  K 3.8 4.0 4.1  CL 106 104 107  CO2 25  --  30  GLUCOSE 222* 229* 185*  BUN 14 13 13   CREATININE 0.63 0.60 0.69  CALCIUM 8.9  --  9.0   Liver Function Tests:  Recent Labs Lab 08/29/15 1700 08/30/15 0551  AST 21 15  ALT 20 17  ALKPHOS 79 71  BILITOT 0.4 0.4  PROT 7.3 6.1*  ALBUMIN 3.8 3.2*   No results for input(s): LIPASE, AMYLASE in the last 168 hours. No results for input(s): AMMONIA in the last 168 hours. CBC:  Recent Labs Lab 08/29/15 1700 08/29/15 1706 08/30/15 0551  WBC 10.7*  --  8.7  NEUTROABS 5.7  --   --   HGB 10.6* 12.2 10.2*  HCT 36.1 36.0 34.6*  MCV 72.8*  --  73.6*  PLT 300  --  269   Cardiac Enzymes: No results for input(s): CKTOTAL, CKMB, CKMBINDEX, TROPONINI in the last 168 hours. BNP: BNP (last 3 results) No results for input(s): BNP in the last 8760 hours.  ProBNP (last 3 results)  Recent Labs  11/13/14 1051  PROBNP 88.0    CBG:  Recent Labs Lab 08/31/15 1117 08/31/15 1640 08/31/15 2031 09/01/15 0737 09/01/15 1143  GLUCAP 151* 142* 158* 167* 188*     Active Problems:   DM type 2 causing complication   Obesity   Essential hypertension   Cerebrovascular disease   Left facial numbness   Time coordinating discharge: 45 minutes  Signed:  Butch Penny, MD 09/01/2015, 4:44 PM

## 2015-09-01 NOTE — Care Management Important Message (Signed)
Important Message  Patient Details  Name: [name] [name] MRN: [id number] Date of Birth: 07-04-1936   Medicare Important Message Given:  Yes-second notification given    Malcolm Metro, RN 09/01/2015, 2:48 PM

## 2015-09-01 NOTE — Discharge Planning (Signed)
Pt IV and tele removed.  DC paper given, explained and educated.  VSS and RN assessment  revealed stability for DC to home.  Pt given 3 units Novolog prior to DC. Told of suggested FU appts needed and also given script for pain meds.  Pt will be wheeled to front of hospital and family transporting via car to home.

## 2015-09-01 NOTE — Care Management Note (Signed)
Case Management Note  Patient Details  Name: ATIRA BORELLO MRN: [id number] Date of Birth: 1936/08/10  Expected Discharge Date:  08/30/15               Expected Discharge Plan:  Home/Self Care  In-House Referral:  NA  Discharge planning Services  CM Consult  Post Acute Care Choice:  NA Choice offered to:  NA  DME Arranged:    DME Agency:     HH Arranged:    HH Agency:     Status of Service:  Completed, signed off  Medicare Important Message Given:    Date Medicare IM Given:    Medicare IM give by:    Date Additional Medicare IM Given:    Additional Medicare Important Message give by:     If discussed at Long Length of Stay Meetings, dates discussed:    Additional Comments: Admitted with facial numbness and UTI. Pt from home with husband and independent with ADL's. Pt discharging home today with self care. Pt uses a cane as needed. Pt says she needs help at home but not can't get help. Pt does not qualify for Medicaid. Pt refuses HH because she says they don't do anything that would be of any assistance to her. No CM needs identified in conversation with pt.  Malcolm Metro, RN 09/01/2015, 2:44 PM

## 2015-10-10 ENCOUNTER — Ambulatory Visit (HOSPITAL_COMMUNITY)
Admission: RE | Admit: 2015-10-10 | Discharge: 2015-10-10 | Disposition: A | Discharge status: Home / Self Care | Payer: Medicare HMO | Source: Ambulatory Visit | Attending: Family Medicine | Admitting: Family Medicine

## 2015-10-10 ENCOUNTER — Other Ambulatory Visit (HOSPITAL_COMMUNITY): Payer: Self-pay | Admitting: Family Medicine

## 2015-10-10 DIAGNOSIS — E119 Type 2 diabetes mellitus without complications: Secondary | ICD-10-CM | POA: Diagnosis not present

## 2015-10-10 DIAGNOSIS — R519 Headache, unspecified: Secondary | ICD-10-CM

## 2015-10-10 DIAGNOSIS — R51 Headache: Secondary | ICD-10-CM | POA: Diagnosis present

## 2015-10-10 DIAGNOSIS — G319 Degenerative disease of nervous system, unspecified: Secondary | ICD-10-CM | POA: Insufficient documentation

## 2015-10-10 DIAGNOSIS — R479 Unspecified speech disturbances: Secondary | ICD-10-CM

## 2015-11-19 ENCOUNTER — Encounter (HOSPITAL_COMMUNITY): Payer: Self-pay | Admitting: *Deleted

## 2015-11-19 ENCOUNTER — Emergency Department (HOSPITAL_COMMUNITY): Payer: Medicare HMO

## 2015-11-19 ENCOUNTER — Observation Stay (HOSPITAL_COMMUNITY)
Admission: EM | Admit: 2015-11-19 | Discharge: 2015-11-22 | Disposition: A | Discharge status: Home / Self Care | Payer: Medicare HMO | Attending: Family Medicine | Admitting: Family Medicine

## 2015-11-19 DIAGNOSIS — Z7982 Long term (current) use of aspirin: Secondary | ICD-10-CM | POA: Insufficient documentation

## 2015-11-19 DIAGNOSIS — J449 Chronic obstructive pulmonary disease, unspecified: Secondary | ICD-10-CM | POA: Diagnosis not present

## 2015-11-19 DIAGNOSIS — I868 Varicose veins of other specified sites: Secondary | ICD-10-CM | POA: Insufficient documentation

## 2015-11-19 DIAGNOSIS — E118 Type 2 diabetes mellitus with unspecified complications: Secondary | ICD-10-CM

## 2015-11-19 DIAGNOSIS — I10 Essential (primary) hypertension: Secondary | ICD-10-CM | POA: Diagnosis not present

## 2015-11-19 DIAGNOSIS — I252 Old myocardial infarction: Secondary | ICD-10-CM | POA: Insufficient documentation

## 2015-11-19 DIAGNOSIS — D638 Anemia in other chronic diseases classified elsewhere: Secondary | ICD-10-CM | POA: Diagnosis not present

## 2015-11-19 DIAGNOSIS — Z951 Presence of aortocoronary bypass graft: Secondary | ICD-10-CM | POA: Insufficient documentation

## 2015-11-19 DIAGNOSIS — Z794 Long term (current) use of insulin: Secondary | ICD-10-CM | POA: Diagnosis not present

## 2015-11-19 DIAGNOSIS — G4733 Obstructive sleep apnea (adult) (pediatric): Secondary | ICD-10-CM | POA: Diagnosis not present

## 2015-11-19 DIAGNOSIS — E785 Hyperlipidemia, unspecified: Secondary | ICD-10-CM | POA: Diagnosis not present

## 2015-11-19 DIAGNOSIS — Z79899 Other long term (current) drug therapy: Secondary | ICD-10-CM | POA: Diagnosis not present

## 2015-11-19 DIAGNOSIS — Z9114 Patient's other noncompliance with medication regimen: Secondary | ICD-10-CM

## 2015-11-19 DIAGNOSIS — M1990 Unspecified osteoarthritis, unspecified site: Secondary | ICD-10-CM | POA: Diagnosis not present

## 2015-11-19 DIAGNOSIS — R509 Fever, unspecified: Secondary | ICD-10-CM | POA: Diagnosis present

## 2015-11-19 DIAGNOSIS — J441 Chronic obstructive pulmonary disease with (acute) exacerbation: Secondary | ICD-10-CM | POA: Diagnosis not present

## 2015-11-19 DIAGNOSIS — N393 Stress incontinence (female) (male): Secondary | ICD-10-CM | POA: Insufficient documentation

## 2015-11-19 DIAGNOSIS — Z91199 Patient's noncompliance with other medical treatment and regimen due to unspecified reason: Secondary | ICD-10-CM

## 2015-11-19 DIAGNOSIS — Z872 Personal history of diseases of the skin and subcutaneous tissue: Secondary | ICD-10-CM | POA: Diagnosis not present

## 2015-11-19 DIAGNOSIS — R05 Cough: Secondary | ICD-10-CM | POA: Diagnosis present

## 2015-11-19 DIAGNOSIS — I2510 Atherosclerotic heart disease of native coronary artery without angina pectoris: Secondary | ICD-10-CM | POA: Diagnosis not present

## 2015-11-19 DIAGNOSIS — R531 Weakness: Secondary | ICD-10-CM

## 2015-11-19 DIAGNOSIS — J411 Mucopurulent chronic bronchitis: Secondary | ICD-10-CM

## 2015-11-19 DIAGNOSIS — J111 Influenza due to unidentified influenza virus with other respiratory manifestations: Secondary | ICD-10-CM | POA: Diagnosis not present

## 2015-11-19 DIAGNOSIS — G4730 Sleep apnea, unspecified: Secondary | ICD-10-CM

## 2015-11-19 DIAGNOSIS — R69 Illness, unspecified: Secondary | ICD-10-CM

## 2015-11-19 DIAGNOSIS — E02 Subclinical iodine-deficiency hypothyroidism: Secondary | ICD-10-CM

## 2015-11-19 DIAGNOSIS — E669 Obesity, unspecified: Secondary | ICD-10-CM

## 2015-11-19 DIAGNOSIS — J9801 Acute bronchospasm: Secondary | ICD-10-CM

## 2015-11-19 LAB — CBC WITH DIFFERENTIAL/PLATELET
BASOS ABS: 0.1 10*3/uL (ref 0.0–0.1)
BASOS PCT: 1 %
Eosinophils Absolute: 0.3 10*3/uL (ref 0.0–0.7)
Eosinophils Relative: 4 %
HCT: 36.4 % (ref 36.0–46.0)
Hemoglobin: 10.9 g/dL — ABNORMAL LOW (ref 12.0–15.0)
LYMPHS ABS: 1.8 10*3/uL (ref 0.7–4.0)
LYMPHS PCT: 23 %
MCH: 21 pg — AB (ref 26.0–34.0)
MCHC: 29.9 g/dL — AB (ref 30.0–36.0)
MCV: 70.1 fL — ABNORMAL LOW (ref 78.0–100.0)
MONOS PCT: 12 %
Monocytes Absolute: 0.9 10*3/uL (ref 0.1–1.0)
NEUTROS ABS: 4.6 10*3/uL (ref 1.7–7.7)
Neutrophils Relative %: 60 %
PLATELETS: 315 10*3/uL (ref 150–400)
RBC: 5.19 MIL/uL — ABNORMAL HIGH (ref 3.87–5.11)
RDW: 17.1 % — ABNORMAL HIGH (ref 11.5–15.5)
WBC: 7.7 10*3/uL (ref 4.0–10.5)

## 2015-11-19 LAB — BASIC METABOLIC PANEL
Anion gap: 10 (ref 5–15)
BUN: 9 mg/dL (ref 6–20)
CALCIUM: 9.2 mg/dL (ref 8.9–10.3)
CO2: 28 mmol/L (ref 22–32)
Chloride: 101 mmol/L (ref 101–111)
Creatinine, Ser: 0.61 mg/dL (ref 0.44–1.00)
GFR calc Af Amer: >60 mL/min (ref >60)
GLUCOSE: 143 mg/dL — AB (ref 65–99)
POTASSIUM: 3.7 mmol/L (ref 3.5–5.1)
Sodium: 139 mmol/L (ref 135–145)

## 2015-11-19 LAB — GLUCOSE, CAPILLARY
GLUCOSE-CAPILLARY: 203 mg/dL — AB (ref 65–99)
Glucose-Capillary: 284 mg/dL — ABNORMAL HIGH (ref 65–99)

## 2015-11-19 LAB — I-STAT CG4 LACTIC ACID, ED: LACTIC ACID, VENOUS: 0.84 mmol/L (ref 0.5–2.0)

## 2015-11-19 LAB — INFLUENZA PANEL BY PCR (TYPE A & B)
H1N1 flu by pcr: NOT DETECTED
Influenza A By PCR: NEGATIVE
Influenza B By PCR: NEGATIVE

## 2015-11-19 MED ORDER — OSELTAMIVIR PHOSPHATE 75 MG PO CAPS
75.0000 mg | ORAL_CAPSULE | Freq: Once | ORAL | Status: AC
Start: 2015-11-19 — End: 2015-11-19
  Administered 2015-11-19: 75 mg via ORAL
  Filled 2015-11-19: qty 1

## 2015-11-19 MED ORDER — PRAVASTATIN SODIUM 10 MG PO TABS
20.0000 mg | ORAL_TABLET | Freq: Every evening | ORAL | Status: DC
  Administered 2015-11-19 – 2015-11-21 (×3): 20 mg via ORAL
  Filled 2015-11-19 (×3): qty 2

## 2015-11-19 MED ORDER — INSULIN DEGLUDEC 200 UNIT/ML ~~LOC~~ SOPN: 35.0000 [IU] | PEN_INJECTOR | Freq: Two times a day (BID) | SUBCUTANEOUS | Status: DC

## 2015-11-19 MED ORDER — ASPIRIN EC 325 MG PO TBEC
325.0000 mg | DELAYED_RELEASE_TABLET | Freq: Every day | ORAL | Status: DC
  Administered 2015-11-19 – 2015-11-22 (×4): 325 mg via ORAL
  Filled 2015-11-19 (×4): qty 1

## 2015-11-19 MED ORDER — INSULIN DETEMIR 100 UNIT/ML ~~LOC~~ SOLN
35.0000 [IU] | Freq: Two times a day (BID) | SUBCUTANEOUS | Status: DC
  Administered 2015-11-19 – 2015-11-22 (×6): 35 [IU] via SUBCUTANEOUS
  Filled 2015-11-19 (×8): qty 0.35

## 2015-11-19 MED ORDER — ALBUTEROL SULFATE (2.5 MG/3ML) 0.083% IN NEBU
2.5000 mg | INHALATION_SOLUTION | RESPIRATORY_TRACT | Status: DC | PRN
  Administered 2015-11-21: 2.5 mg via RESPIRATORY_TRACT
  Filled 2015-11-19: qty 3

## 2015-11-19 MED ORDER — POLYETHYLENE GLYCOL 3350 17 G PO PACK
17.0000 g | PACK | Freq: Every day | ORAL | Status: DC | PRN
  Administered 2015-11-20 – 2015-11-22 (×2): 17 g via ORAL
  Filled 2015-11-19 (×2): qty 1

## 2015-11-19 MED ORDER — ONDANSETRON HCL 4 MG/2ML IJ SOLN: 4.0000 mg | Freq: Four times a day (QID) | INTRAMUSCULAR | Status: DC | PRN

## 2015-11-19 MED ORDER — INSULIN DETEMIR 100 UNIT/ML ~~LOC~~ SOLN
70.0000 [IU] | Freq: Every day | SUBCUTANEOUS | Status: DC
  Filled 2015-11-19: qty 0.7

## 2015-11-19 MED ORDER — INSULIN DETEMIR 100 UNIT/ML ~~LOC~~ SOLN
35.0000 [IU] | Freq: Every day | SUBCUTANEOUS | Status: DC
  Filled 2015-11-19: qty 0.35

## 2015-11-19 MED ORDER — ENOXAPARIN SODIUM 40 MG/0.4ML ~~LOC~~ SOLN
40.0000 mg | SUBCUTANEOUS | Status: DC
  Administered 2015-11-19 – 2015-11-21 (×3): 40 mg via SUBCUTANEOUS
  Filled 2015-11-19 (×3): qty 0.4

## 2015-11-19 MED ORDER — NITROGLYCERIN 0.4 MG SL SUBL: 0.4000 mg | SUBLINGUAL_TABLET | SUBLINGUAL | Status: DC | PRN

## 2015-11-19 MED ORDER — ACETAMINOPHEN 650 MG RE SUPP
650.0000 mg | Freq: Four times a day (QID) | RECTAL | Status: DC | PRN
Start: 2015-11-19 — End: 2015-11-22

## 2015-11-19 MED ORDER — LISINOPRIL 10 MG PO TABS
10.0000 mg | ORAL_TABLET | Freq: Every morning | ORAL | Status: DC
  Administered 2015-11-20 – 2015-11-22 (×3): 10 mg via ORAL
  Filled 2015-11-19 (×3): qty 1

## 2015-11-19 MED ORDER — INSULIN DETEMIR 100 UNIT/ML ~~LOC~~ SOLN: 35.0000 [IU] | Freq: Every day | SUBCUTANEOUS | Status: DC

## 2015-11-19 MED ORDER — HYDROCODONE-ACETAMINOPHEN 5-325 MG PO TABS
1.0000 | ORAL_TABLET | Freq: Once | ORAL | Status: AC
Start: 2015-11-19 — End: 2015-11-19
  Administered 2015-11-19: 1 via ORAL
  Filled 2015-11-19: qty 1

## 2015-11-19 MED ORDER — IPRATROPIUM BROMIDE 0.02 % IN SOLN
0.5000 mg | Freq: Once | RESPIRATORY_TRACT | Status: AC
  Administered 2015-11-19: 0.5 mg via RESPIRATORY_TRACT
  Filled 2015-11-19: qty 2.5

## 2015-11-19 MED ORDER — LEVOTHYROXINE SODIUM 25 MCG PO TABS
25.0000 ug | ORAL_TABLET | Freq: Every day | ORAL | Status: DC
  Administered 2015-11-20 – 2015-11-22 (×3): 25 ug via ORAL
  Filled 2015-11-19 (×3): qty 1

## 2015-11-19 MED ORDER — GUAIFENESIN ER 600 MG PO TB12
600.0000 mg | ORAL_TABLET | Freq: Two times a day (BID) | ORAL | Status: DC
  Administered 2015-11-19 – 2015-11-22 (×6): 600 mg via ORAL
  Filled 2015-11-19 (×6): qty 1

## 2015-11-19 MED ORDER — HYDROCODONE-ACETAMINOPHEN 5-325 MG PO TABS
1.0000 | ORAL_TABLET | ORAL | Status: DC | PRN
  Administered 2015-11-19 – 2015-11-20 (×3): 1 via ORAL
  Administered 2015-11-21: 2 via ORAL
  Filled 2015-11-19: qty 2
  Filled 2015-11-19 (×3): qty 1

## 2015-11-19 MED ORDER — INSULIN DETEMIR 100 UNIT/ML ~~LOC~~ SOLN: 35.0000 [IU] | Freq: Two times a day (BID) | SUBCUTANEOUS | Status: DC

## 2015-11-19 MED ORDER — ALUM & MAG HYDROXIDE-SIMETH 200-200-20 MG/5ML PO SUSP: 30.0000 mL | Freq: Four times a day (QID) | ORAL | Status: DC | PRN

## 2015-11-19 MED ORDER — DIPHENHYDRAMINE HCL 25 MG PO CAPS
25.0000 mg | ORAL_CAPSULE | Freq: Three times a day (TID) | ORAL | Status: DC | PRN
  Administered 2015-11-21 – 2015-11-22 (×2): 25 mg via ORAL
  Filled 2015-11-19 (×2): qty 1

## 2015-11-19 MED ORDER — SODIUM CHLORIDE 0.9 % IV SOLN
INTRAVENOUS | Status: DC
  Administered 2015-11-19 – 2015-11-22 (×3): via INTRAVENOUS

## 2015-11-19 MED ORDER — ONDANSETRON HCL 4 MG PO TABS: 4.0000 mg | ORAL_TABLET | Freq: Four times a day (QID) | ORAL | Status: DC | PRN

## 2015-11-19 MED ORDER — INSULIN ASPART 100 UNIT/ML ~~LOC~~ SOLN
0.0000 [IU] | Freq: Three times a day (TID) | SUBCUTANEOUS | Status: DC
  Administered 2015-11-19: 5 [IU] via SUBCUTANEOUS
  Administered 2015-11-20: 7 [IU] via SUBCUTANEOUS
  Administered 2015-11-20: 1 [IU] via SUBCUTANEOUS
  Administered 2015-11-20: 5 [IU] via SUBCUTANEOUS
  Administered 2015-11-21: 3 [IU] via SUBCUTANEOUS
  Administered 2015-11-21 (×2): 5 [IU] via SUBCUTANEOUS
  Administered 2015-11-22: 9 [IU] via SUBCUTANEOUS
  Administered 2015-11-22: 5 [IU] via SUBCUTANEOUS

## 2015-11-19 MED ORDER — ACETAMINOPHEN 325 MG PO TABS: 650.0000 mg | ORAL_TABLET | Freq: Four times a day (QID) | ORAL | Status: DC | PRN

## 2015-11-19 MED ORDER — OSELTAMIVIR PHOSPHATE 75 MG PO CAPS
75.0000 mg | ORAL_CAPSULE | Freq: Two times a day (BID) | ORAL | Status: DC
  Administered 2015-11-19 – 2015-11-22 (×6): 75 mg via ORAL
  Filled 2015-11-19 (×6): qty 1

## 2015-11-19 MED ORDER — INSULIN ASPART 100 UNIT/ML ~~LOC~~ SOLN
0.0000 [IU] | Freq: Every day | SUBCUTANEOUS | Status: DC
  Administered 2015-11-19: 2 [IU] via SUBCUTANEOUS
  Administered 2015-11-20 – 2015-11-21 (×2): 4 [IU] via SUBCUTANEOUS

## 2015-11-19 MED ORDER — INSULIN ASPART 100 UNIT/ML ~~LOC~~ SOLN
10.0000 [IU] | Freq: Three times a day (TID) | SUBCUTANEOUS | Status: DC
  Administered 2015-11-19 – 2015-11-22 (×9): 10 [IU] via SUBCUTANEOUS

## 2015-11-19 MED ORDER — ZOLPIDEM TARTRATE 5 MG PO TABS
5.0000 mg | ORAL_TABLET | Freq: Every evening | ORAL | Status: DC | PRN
  Administered 2015-11-19 – 2015-11-21 (×3): 5 mg via ORAL
  Filled 2015-11-19 (×3): qty 1

## 2015-11-19 MED ORDER — DIAZEPAM 5 MG PO TABS
5.0000 mg | ORAL_TABLET | Freq: Three times a day (TID) | ORAL | Status: DC | PRN
  Administered 2015-11-20 – 2015-11-21 (×2): 5 mg via ORAL
  Filled 2015-11-19 (×2): qty 1

## 2015-11-19 MED ORDER — ALBUTEROL (5 MG/ML) CONTINUOUS INHALATION SOLN
10.0000 mg/h | INHALATION_SOLUTION | Freq: Once | RESPIRATORY_TRACT | Status: AC
  Administered 2015-11-19: 10 mg/h via RESPIRATORY_TRACT
  Filled 2015-11-19: qty 20

## 2015-11-19 NOTE — ED Provider Notes (Signed)
CSN: [id number]     Arrival date & time 11/19/15  1115 History  By signing my name below, I, Ronney LionSuzanne Le, attest that this documentation has been prepared under the direction and in the presence of Mancel BaleElliott Sacoya Mcgourty, MD. Electronically Signed: Ronney LionSuzanne Le, [name] 11/19/2015. 8:22 PM.    Chief Complaint  Patient presents with  . Cough   The history is provided by the patient. No language interpreter was used.    HPI Comments: [name] [name] is a 79 y.o. female with a history of COPD, MI, diastolic CHF, IDDM Type 2, HLD, and HTN, who presents to the Emergency Department complaining of a gradual-onset, constant cough productive with yellow sputum that began yesterday. She also complains of associated nausea, weakness, and a fever with a Tmax at 102 measured at home in the past few days. She reports she did not try any treatments or medications for her symptoms. Patient states she did have a nebulizer at home for a history of COPD, but she has not used it recently as she states she has not had a COPD exacerbation for "a long time."  She denies a history of smoking. Patient reports a history of CABG in 2011 but states she has had no problems since. Patient reports she had a flu vaccine several weeks ago. Her husband drove her here today.   Past Medical History  Diagnosis Date  . Arteriosclerotic cardiovascular disease (ASCVD) CARDIOLOGIST-   DR Dietrich PatesOTHBART     CABG surgery in 2001; nl stress nuclear-2007; angiography in 01/2006- TO of LAD; 70% ostial stenosis      of a small ramus; patent grafts; normal EF; cath in 12/2009 essentially unchanged; EF-66%;  Congestive heart failure with preserved LV systolic function  . Hypertension   . Varicose veins   . Hypothyroidism   . Insulin dependent type 2 diabetes mellitus (HCC)   . Peripheral neuropathy (HCC)   . Diastolic CHF (HCC)   . Hyperlipidemia   . History of CVA (cerebrovascular accident)     2013--  LACUNAR INFARCTIONS RIGHT THALAMUS---  residual left  side of  lip numb  and left eye vision worse (which corrented lens implant from cataract extraction)  . Lesion of bladder   . Hematuria   . Arthritis   . DDD (degenerative disc disease), lumbar   . OSA (obstructive sleep apnea)   . Chronic obstructive pulmonary disease (HCC)     Chronic bronchitis  . History of MI (myocardial infarction)     07/ 2001   s/p  cabg x2  . S/P CABG x 2     07-02-2000  . SUI (stress urinary incontinence, female)   . Lower urinary tract symptoms (LUTS)   . Psoriasis     lower leg  . Thinning of skin    Past Surgical History  Procedure Laterality Date  . Rotator cuff repair Right 1995  . Lumbar spine surgery  x2    yrs ago  . Carpal tunnel release Bilateral yrs ago  . Orif humerus fracture Right 04-18-2008  . Knee arthroscopy Left 10-18-2003    SYNOVECTOMY  . Repair flexor tendon hand Right 09-20-2008    CARPI RADIALIS TENDON TRANSFER TO EXTENSOR TO INDEX, MIDDLE, RING , LITTLE FINGERS AND DIGITI MININI  . Cardiovascular stress test  08-28-2011   DR ROTHBART    NEGATIVE NUCLEAR STUDY/  GLOBAL LVSF/  EF 65%  . Cholecystectomy  1990  . Appendectomy  age 79  . Total abdominal hysterectomy w/  bilateral salpingoophorectomy  age 89  . Cataract extraction w/ intraocular lens  implant, bilateral  2013  . Cardiac catheterization  1991    No sig. cad  . Coronary angioplasty  11-13-1999    balloon angioplasty to mLAD , d2 of LAD  . Coronary angioplasty with stent placement  01-17-2000    PCI stenting to mLAD & D2 of LAD  . Cardiac catheterization  07-01-2000    high grade in-stent restenosis  . Cardiac catheterization  04-30-2001    single native vessel cad/ graft patent  . Cardiac catheterization  02-13-2006  dr Juanda Chance    2V cad with total occluded LAD  and  70% ostial of small ramus/  grafts patent/  normal ef  . Cardiac catheterization  01-20-2000  dr Clifton James    essentially unchanged;  severe single vessel cad with diffuse disease throughout CFX and  RCA/ ef 66%;  chf with preserved LVSF  . Coronary artery bypass graft  07-02-2000   DR Tressie Stalker    SVG to diagonal, LIMA to LAD  . Transthoracic echocardiogram  12-12-2013    MILD LVH/  EF 60-65%/  GRADE I DIASTOLIC DYSFUNCTION/  MILD LAE  . Cystoscopy w/ retrogrades Bilateral 08/09/2014    Procedure: CYSTOSCOPY, BILATERAL RETROGRADE, HYDRODISTENSION, BLADDER BIOPSY WITH FULGERATION, INSTILL PYRIDIUM AND MARCAINE ;  Surgeon: Anner Crete, MD;  Location: [facility];  Service: Urology;  Laterality: Bilateral;  . Colonoscopy N/A 07/26/2015    Procedure: COLONOSCOPY;  Surgeon: Malissa Hippo, MD;  Location: [facility];  Service: Endoscopy;  Laterality: N/A;  1200   Family History  Problem Relation Age of Onset  . Coronary artery disease      Multiple first and second-degree relatives  . Aneurysm      Cerebral circulation  . Hypertension Mother   . Colon cancer Sister   . Clotting disorder      Children diagnosed with hypercoagulable state   Social History  Substance Use Topics  . Smoking status: Never Smoker   . Smokeless tobacco: Never Used  . Alcohol Use: No   OB History    Gravida Para Term Preterm AB TAB SAB Ectopic Multiple Living   7 7 7       4      Review of Systems  All other systems reviewed and are negative.  Allergies  Codeine  Home Medications   Prior to Admission medications   Medication Sig Start Date End Date Taking? Authorizing Provider  albuterol (PROVENTIL) (2.5 MG/3ML) 0.083% nebulizer solution Take 3 mLs (2.5 mg total) by nebulization every 6 (six) hours as needed for wheezing or shortness of breath. 09/11/14  Yes Eber Hong, MD  aspirin EC 325 MG tablet Take 325 mg by mouth daily.   Yes Historical Provider, MD  diazepam (VALIUM) 5 MG tablet Take 5 mg by mouth every 6 (six) hours as needed for anxiety.   Yes Historical Provider, MD  Insulin Degludec (TRESIBA FLEXTOUCH) 200 UNIT/ML SOPN Inject 35-70 Units into the skin 2 (two) times  daily. 35 units in the morning and 70 units in the evening   Yes Historical Provider, MD  insulin lispro (HUMALOG) 100 UNIT/ML injection Inject 20 Units into the skin 3 (three) times daily. Or as directed per sliding scale   Yes Historical Provider, MD  levothyroxine (SYNTHROID, LEVOTHROID) 25 MCG tablet Take 25 mcg by mouth every morning.    Yes Historical Provider, MD  lisinopril (PRINIVIL,ZESTRIL) 10 MG tablet Take 1  tablet by mouth every morning.  11/09/13  Yes Historical Provider, MD  metFORMIN (GLUCOPHAGE) 500 MG tablet Take 500 mg by mouth 2 (two) times daily with a meal.     Yes Historical Provider, MD  nitroGLYCERIN (NITROSTAT) 0.4 MG SL tablet Place 0.4 mg under the tongue every 5 (five) minutes as needed for chest pain.    Yes Historical Provider, MD  pravastatin (PRAVACHOL) 20 MG tablet Take 20 mg by mouth every evening.    Yes Historical Provider, MD  zolpidem (AMBIEN) 5 MG tablet Take 5 mg by mouth at bedtime as needed for sleep.  08/12/14  Yes Historical Provider, MD   BP 165/69 mmHg  Pulse 66  Temp(Src) 97.6 F (36.4 C) (Oral)  Resp 20  Ht  (1.626 m)  Wt 202 lb 2.6 oz (91.7 kg)  BMI 34.68 kg/m2  SpO2 94% Physical Exam  Constitutional: She is oriented to person, place, and time. She appears well-developed and well-nourished.  HENT:  Head: Normocephalic and atraumatic.  Eyes: Conjunctivae and EOM are normal. Pupils are equal, round, and reactive to light.  Neck: Normal range of motion and phonation normal. Neck supple.  Cardiovascular: Normal rate and regular rhythm.   Pulmonary/Chest: Effort normal. She has decreased breath sounds. She has wheezes. She has no rhonchi. She has no rales. She exhibits no tenderness.  Decreased air movement bilaterally. Few scattered wheezes. No rhonchi, no rales.  Abdominal: Soft. She exhibits no distension. There is no tenderness. There is no guarding.  Musculoskeletal: Normal range of motion.  Neurological: She is alert and oriented to  person, place, and time. She exhibits normal muscle tone.  Skin: Skin is warm and dry.  Psychiatric: She has a normal mood and affect. Her behavior is normal. Judgment and thought content normal.  Nursing note and vitals reviewed.   ED Course  Procedures (including critical care time)  DIAGNOSTIC STUDIES: Oxygen Saturation is 96% on RA, normal by my interpretation.    COORDINATION OF CARE: 11:47 AM - Likely COPD exacerbation, but still awaiting CXR findings. Will update pt on treatment plan once CXR results. Pt verbalized understanding and agreed to plan.    Medications  enoxaparin (LOVENOX) injection 40 mg (40 mg Subcutaneous Given 11/20/15 1739)  0.9 %  sodium chloride infusion ( Intravenous New Bag/Given 11/20/15 0928)  acetaminophen (TYLENOL) tablet 650 mg (not administered)    Or  acetaminophen (TYLENOL) suppository 650 mg (not administered)  HYDROcodone-acetaminophen (NORCO/VICODIN) 5-325 MG per tablet 1-2 tablet (1 tablet Oral Given 11/20/15 1007)  polyethylene glycol (MIRALAX / GLYCOLAX) packet 17 g (not administered)  ondansetron (ZOFRAN) tablet 4 mg (not administered)    Or  ondansetron (ZOFRAN) injection 4 mg (not administered)  alum & mag hydroxide-simeth (MAALOX/MYLANTA) 200-200-20 MG/5ML suspension 30 mL (not administered)  diphenhydrAMINE (BENADRYL) capsule 25 mg (not administered)  oseltamivir (TAMIFLU) capsule 75 mg (75 mg Oral Given 11/20/15 1007)  albuterol (PROVENTIL) (2.5 MG/3ML) 0.083% nebulizer solution 2.5 mg (not administered)  insulin aspart (novoLOG) injection 0-5 Units (2 Units Subcutaneous Given 11/19/15 2155)  insulin aspart (novoLOG) injection 0-9 Units (7 Units Subcutaneous Given 11/20/15 1700)  insulin aspart (novoLOG) injection 10 Units (10 Units Subcutaneous Given 11/20/15 1735)  lisinopril (PRINIVIL,ZESTRIL) tablet 10 mg (10 mg Oral Given 11/20/15 1006)  levothyroxine (SYNTHROID, LEVOTHROID) tablet 25 mcg (25 mcg Oral Given 11/20/15 1006)   nitroGLYCERIN (NITROSTAT) SL tablet 0.4 mg (not administered)  pravastatin (PRAVACHOL) tablet 20 mg (20 mg Oral Given 11/20/15 1739)  zolpidem (AMBIEN) tablet  5 mg (5 mg Oral Given 11/19/15 2150)  aspirin EC tablet 325 mg (325 mg Oral Given 11/20/15 1007)  diazepam (VALIUM) tablet 5 mg (not administered)  guaiFENesin (MUCINEX) 12 hr tablet 600 mg (600 mg Oral Given 11/20/15 1006)  insulin detemir (LEVEMIR) injection 35 Units (35 Units Subcutaneous Given 11/20/15 0957)  doxycycline (VIBRA-TABS) tablet 100 mg (100 mg Oral Given 11/20/15 1006)  predniSONE (DELTASONE) tablet 40 mg (40 mg Oral Given 11/20/15 1006)  HYDROcodone-homatropine (HYCODAN) 5-1.5 MG/5ML syrup 5 mL (5 mLs Oral Given 11/20/15 1022)  albuterol (PROVENTIL,VENTOLIN) solution continuous neb (10 mg/hr Nebulization Given 11/19/15 1247)  ipratropium (ATROVENT) nebulizer solution 0.5 mg (0.5 mg Nebulization Given 11/19/15 1248)  oseltamivir (TAMIFLU) capsule 75 mg (75 mg Oral Given 11/19/15 1443)  HYDROcodone-acetaminophen (NORCO/VICODIN) 5-325 MG per tablet 1 tablet (1 tablet Oral Given 11/19/15 1443)    Patient Vitals for the past 24 hrs:  BP Temp Temp src Pulse Resp SpO2  11/20/15 1521 (!) 165/69 mmHg 97.6 F (36.4 C) Oral 66 20 94 %  11/20/15 0619 (!) 153/47 mmHg 99.3 F (37.4 C) Oral 66 20 93 %  11/20/15 0022 - - - 86 20 92 %  11/19/15 2122 (!) 148/53 mmHg 99.1 F (37.3 C) Oral 86 20 95 %    2:24 PM Reevaluation with update and discussion. After initial assessment and treatment, an updated evaluation reveals patient states she has no better since treatment with nebulizer. She also complains of worsening headache. Findings discussed with patient , all questions answered.Mancel Bale L   Discussed with hospitalist to arrange admission  Labs Review Labs Reviewed  BASIC METABOLIC PANEL - Abnormal; Notable for the following:    Glucose, Bld 143 (*)    All other components within normal limits  CBC WITH  DIFFERENTIAL/PLATELET - Abnormal; Notable for the following:    RBC 5.19 (*)    Hemoglobin 10.9 (*)    MCV 70.1 (*)    MCH 21.0 (*)    MCHC 29.9 (*)    RDW 17.1 (*)    All other components within normal limits  GLUCOSE, CAPILLARY - Abnormal; Notable for the following:    Glucose-Capillary 284 (*)    All other components within normal limits  GLUCOSE, CAPILLARY - Abnormal; Notable for the following:    Glucose-Capillary 203 (*)    All other components within normal limits  GLUCOSE, CAPILLARY - Abnormal; Notable for the following:    Glucose-Capillary 148 (*)    All other components within normal limits  GLUCOSE, CAPILLARY - Abnormal; Notable for the following:    Glucose-Capillary 279 (*)    All other components within normal limits  INFLUENZA PANEL BY PCR (TYPE A & B, H1N1)  I-STAT CG4 LACTIC ACID, ED    Imaging Review Dg Chest 2 View  11/19/2015  CLINICAL DATA:  Cough, nausea and fever beginning yesterday. EXAM: CHEST  2 VIEW COMPARISON:  02/07/2015, 08/24/2014 and 11/13/2014 FINDINGS: Sternotomy wires unchanged. Lungs are adequately inflated with minimal scarring over the left upper lobe. No focal consolidation or effusion. Cardiomediastinal silhouette and remainder of the exam is unchanged. IMPRESSION: No active cardiopulmonary disease. Electronically Signed   By: Elberta Fortis M.D.   On: 11/19/2015 12:07   I have personally reviewed and evaluated these images and lab results as part of my medical decision-making.  MDM   Final diagnoses:  Febrile illness  Bronchospasm   Persistent cough with fever. Suspect viral process. Doubt Sepsis, or impending vascular collapse  Nursing Notes Reviewed/  Care Coordinated Applicable Imaging Reviewed Interpretation of Laboratory Data incorporated into ED treatment  Plan: Admit  I personally performed the services described in this documentation, which was scribed in my presence. The recorded information has been reviewed and is  accurate.        Mancel Bale, MD 11/20/15 2024

## 2015-11-19 NOTE — ED Notes (Signed)
Pt states she started having a cough yesterday and began having nausea and fevers since. Denies diarrhea.

## 2015-11-19 NOTE — H&P (Signed)
History and Physical  [name] AuerRuby J [name] ZOX:[id number] DOB: 1936-10-19 DOA: 11/19/2015  Referring physician: Dr. Bernell ListE. Wentz in ED PCP: Alice ReichertMCINNIS,ANGUS G, MD   Chief Complaint: coughing  HPI:  79yow PMH COPD, CABG, diastolic CHF, DM presented to ED with 3 day h/o cough, congestion, HA. Initial evaluation suggested ILI.  Patient received flu shot ~3 weeks ago. For last 3 days has had productive cough, congestion, headache unrelieved with acetaminophen, generalized muscles aches. Low grade fever and chills last night. Coughed all last night.  In the emergency department afebrile, VSS, no hypoxia. Treated with nebs and Tamiflu Pertinent labs: BMP, CBC, lactic acid unremarkable Imaging: CXR independently reviewed. NAD  Review of Systems:  Positive for subjective fever, sore throat, nausea  Negative for visual changes,new rash, chest pain, SOB, dysuria, bleeding, v/abdominal pain.  Past Medical History  Diagnosis Date  . Arteriosclerotic cardiovascular disease (ASCVD) CARDIOLOGIST-   DR Dietrich PatesOTHBART     CABG surgery in 2001; nl stress nuclear-2007; angiography in 01/2006- TO of LAD; 70% ostial stenosis      of a small ramus; patent grafts; normal EF; cath in 12/2009 essentially unchanged; EF-66%;  Congestive heart failure with preserved LV systolic function  . Hypertension   . Varicose veins   . Hypothyroidism   . Insulin dependent type 2 diabetes mellitus (HCC)   . Peripheral neuropathy (HCC)   . Diastolic CHF (HCC)   . Hyperlipidemia   . History of CVA (cerebrovascular accident)     2013--  LACUNAR INFARCTIONS RIGHT THALAMUS---  residual left side of  lip numb  and left eye vision worse (which corrented lens implant from cataract extraction)  . Lesion of bladder   . Hematuria   . Arthritis   . DDD (degenerative disc disease), lumbar   . OSA (obstructive sleep apnea)     NON-COMPLIANT CPAP  . Chronic obstructive pulmonary disease (HCC)     Chronic bronchitis  . History of MI (myocardial  infarction)     07/ 2001   s/p  cabg x2  . S/P CABG x 2     07-02-2000  . SUI (stress urinary incontinence, female)   . Lower urinary tract symptoms (LUTS)   . Psoriasis     lower leg  . Thinning of skin     Past Surgical History  Procedure Laterality Date  . Rotator cuff repair Right 1995  . Lumbar spine surgery  x2    yrs ago  . Carpal tunnel release Bilateral yrs ago  . Orif humerus fracture Right 04-18-2008  . Knee arthroscopy Left 10-18-2003    SYNOVECTOMY  . Repair flexor tendon hand Right 09-20-2008    CARPI RADIALIS TENDON TRANSFER TO EXTENSOR TO INDEX, MIDDLE, RING , LITTLE FINGERS AND DIGITI MININI  . Cardiovascular stress test  08-28-2011   DR ROTHBART    NEGATIVE NUCLEAR STUDY/  GLOBAL LVSF/  EF 65%  . Cholecystectomy  1990  . Appendectomy  age 89  . Total abdominal hysterectomy w/ bilateral salpingoophorectomy  age 79  . Cataract extraction w/ intraocular lens  implant, bilateral  2013  . Cardiac catheterization  1991    No sig. cad  . Coronary angioplasty  11-13-1999    balloon angioplasty to mLAD , d2 of LAD  . Coronary angioplasty with stent placement  01-17-2000    PCI stenting to mLAD & D2 of LAD  . Cardiac catheterization  07-01-2000    high grade in-stent restenosis  . Cardiac catheterization  04-30-2001  single native vessel cad/ graft patent  . Cardiac catheterization  02-13-2006  dr Juanda Chance    2V cad with total occluded LAD  and  70% ostial of small ramus/  grafts patent/  normal ef  . Cardiac catheterization  01-20-2000  dr Clifton James    essentially unchanged;  severe single vessel cad with diffuse disease throughout CFX and RCA/ ef 66%;  chf with preserved LVSF  . Coronary artery bypass graft  07-02-2000   DR Tressie Stalker    SVG to diagonal, LIMA to LAD  . Transthoracic echocardiogram  12-12-2013    MILD LVH/  EF 60-65%/  GRADE I DIASTOLIC DYSFUNCTION/  MILD LAE  . Cystoscopy w/ retrogrades Bilateral 08/09/2014    Procedure: CYSTOSCOPY, BILATERAL  RETROGRADE, HYDRODISTENSION, BLADDER BIOPSY WITH FULGERATION, INSTILL PYRIDIUM AND MARCAINE ;  Surgeon: Anner Crete, MD;  Location: [facility];  Service: Urology;  Laterality: Bilateral;  . Abdominal hysterectomy    . Colonoscopy N/A 07/26/2015    Procedure: COLONOSCOPY;  Surgeon: Malissa Hippo, MD;  Location: [facility];  Service: Endoscopy;  Laterality: N/A;  1200    Social History:  reports that she has never smoked. She has never used smokeless tobacco. She reports that she does not drink alcohol or use illicit drugs. lives with their family Self-care  Allergies  Allergen Reactions  . Codeine Itching    Family History  Problem Relation Age of Onset  . Coronary artery disease      Multiple first and second-degree relatives  . Aneurysm      Cerebral circulation  . Hypertension Mother   . Colon cancer Sister   . Clotting disorder      Children diagnosed with hypercoagulable state     Prior to Admission medications   Medication Sig Start Date End Date Taking? Authorizing Provider  acetaminophen-codeine (TYLENOL #3) 300-30 MG tablet Take 1 tablet by mouth every 4 (four) hours as needed for moderate pain.  11/14/15  Yes Historical Provider, MD  albuterol (PROVENTIL) (2.5 MG/3ML) 0.083% nebulizer solution Take 3 mLs (2.5 mg total) by nebulization every 6 (six) hours as needed for wheezing or shortness of breath. 09/11/14  Yes Eber Hong, MD  aspirin EC 325 MG tablet Take 325 mg by mouth daily.   Yes Historical Provider, MD  diazepam (VALIUM) 5 MG tablet Take 5 mg by mouth every 6 (six) hours as needed for anxiety.   Yes Historical Provider, MD  Insulin Degludec (TRESIBA FLEXTOUCH) 200 UNIT/ML SOPN Inject 35-70 Units into the skin 2 (two) times daily. 35 units in the morning and 70 units in the evening   Yes Historical Provider, MD  insulin lispro (HUMALOG) 100 UNIT/ML injection Inject 20 Units into the skin 3 (three) times daily. Or as directed per sliding scale    Yes Historical Provider, MD  levothyroxine (SYNTHROID, LEVOTHROID) 25 MCG tablet Take 25 mcg by mouth every morning.    Yes Historical Provider, MD  lisinopril (PRINIVIL,ZESTRIL) 10 MG tablet Take 1 tablet by mouth every morning.  11/09/13  Yes Historical Provider, MD  metFORMIN (GLUCOPHAGE) 500 MG tablet Take 500 mg by mouth 2 (two) times daily with a meal.     Yes Historical Provider, MD  naproxen sodium (ANAPROX) 220 MG tablet Take 220 mg by mouth daily as needed (pain).   Yes Historical Provider, MD  nitroGLYCERIN (NITROSTAT) 0.4 MG SL tablet Place 0.4 mg under the tongue every 5 (five) minutes as needed for chest pain.  Yes Historical Provider, MD  pravastatin (PRAVACHOL) 20 MG tablet Take 20 mg by mouth every evening.    Yes Historical Provider, MD  zolpidem (AMBIEN) 5 MG tablet Take 5 mg by mouth at bedtime as needed for sleep.  08/12/14  Yes Historical Provider, MD  meloxicam (MOBIC) 15 MG tablet Take 15 mg by mouth daily.    Historical Provider, MD   Physical Exam: Filed Vitals:   11/19/15 1120 11/19/15 1248 11/19/15 1303  BP: 136/90  167/78  Pulse: 80  82  Temp: 99 F (37.2 C)    TempSrc: Oral    Resp: 18  22  Height:  (1.626 m)    Weight: 92.987 kg (205 lb)    SpO2: 96% 94% 98%     General:  Appears calm and comfortable, mildly ill Eyes: PERRL, normal lids, irises  ENT: grossly normal hearing, lips  Neck: no LAD, masses or thyromegaly Cardiovascular: RRR, no m/r/g. No LE edema. Respiratory: CTA bilaterally, no w/r/r. Normal respiratory effort. Abdomen: soft, ntnd Skin: erythematous plaques left elbow, BLE R>L Musculoskeletal: grossly normal tone BUE/BLE Psychiatric: grossly normal mood and affect, speech fluent and appropriate Neurologic: grossly non-focal.  Wt Readings from Last 3 Encounters:  11/19/15 92.987 kg (205 lb)  08/29/15 93.486 kg (206 lb 1.6 oz)  08/29/15 93.441 kg (206 lb)    Labs on Admission:  Basic Metabolic Panel:  Recent Labs Lab  11/19/15 1215  NA 139  K 3.7  CL 101  CO2 28  GLUCOSE 143*  BUN 9  CREATININE 0.61  CALCIUM 9.2    CBC:  Recent Labs Lab 11/19/15 1215  WBC 7.7  NEUTROABS 4.6  HGB 10.9*  HCT 36.4  MCV 70.1*  PLT 315    Radiological Exams on Admission: Dg Chest 2 View  11/19/2015  CLINICAL DATA:  Cough, nausea and fever beginning yesterday. EXAM: CHEST  2 VIEW COMPARISON:  02/07/2015, 08/24/2014 and 11/13/2014 FINDINGS: Sternotomy wires unchanged. Lungs are adequately inflated with minimal scarring over the left upper lobe. No focal consolidation or effusion. Cardiomediastinal silhouette and remainder of the exam is unchanged. IMPRESSION: No active cardiopulmonary disease. Electronically Signed   By: Elberta Fortis M.D.   On: 11/19/2015 12:07      Principal Problem:   Influenza-like illness Active Problems:   Chronic obstructive pulmonary disease (HCC)   Bronchitis   Anemia of chronic disease   OSA (obstructive sleep apnea)   Assessment/Plan 1. ILI. CXR clear. Afebrile, hemodynamics stable. Consider concomitant viral bronchitis. 2. COPD, appears stable. 3. IDDM type 2 with peripheral neuropathy. Glucose 143 AG 10. 4. Anemia of chronic disease, stable 5. PMH CABG, diastolic CHF 6. OSA, uses CPAP QHS   Plan observation  Empiric Tamiflu, droplet precautions, check influenza PCR  Nebs; consider steroids next 24 hours if not improving  SSI and long acting insuline  CPAP QHS  Code Status: full code  DVT prophylaxis: Lovenox Family Communication: none present. Patient alert and understands recommendations Disposition Plan/Anticipated LOS: obs, 1-2 days  Time spent: 60 minutes  Brendia Sacks, MD  [facility] Pager [phone] 11/19/2015, 2:33 PM

## 2015-11-19 NOTE — Progress Notes (Signed)
Mrs. [name] [name], [name] [name] arrived to rm# [id number] at 1520.  Report taken from US AirwaysCrystal blackburn (ED).  Dr. Irene LimboGoodrich notified.

## 2015-11-19 NOTE — ED Notes (Signed)
Pt temp was 99.0

## 2015-11-20 LAB — GLUCOSE, CAPILLARY
GLUCOSE-CAPILLARY: 279 mg/dL — AB (ref 65–99)
GLUCOSE-CAPILLARY: 305 mg/dL — AB (ref 65–99)
Glucose-Capillary: 148 mg/dL — ABNORMAL HIGH (ref 65–99)

## 2015-11-20 MED ORDER — HYDROCODONE-HOMATROPINE 5-1.5 MG/5ML PO SYRP
5.0000 mL | ORAL_SOLUTION | ORAL | Status: DC | PRN
  Administered 2015-11-20 (×2): 5 mL via ORAL
  Filled 2015-11-20 (×3): qty 5

## 2015-11-20 MED ORDER — DOXYCYCLINE HYCLATE 100 MG PO TABS
100.0000 mg | ORAL_TABLET | Freq: Two times a day (BID) | ORAL | Status: DC
Start: 2015-11-20 — End: 2015-11-22
  Administered 2015-11-20 – 2015-11-22 (×5): 100 mg via ORAL
  Filled 2015-11-20 (×5): qty 1

## 2015-11-20 MED ORDER — PREDNISONE 20 MG PO TABS
40.0000 mg | ORAL_TABLET | Freq: Every day | ORAL | Status: DC
  Administered 2015-11-20 – 2015-11-21 (×2): 40 mg via ORAL
  Filled 2015-11-20 (×2): qty 2

## 2015-11-20 NOTE — Care Management Obs Status (Signed)
MEDICARE OBSERVATION STATUS NOTIFICATION   Patient Details  Name: [name] [name] MRN: [id number] Date of Birth: [birth year]   Medicare Observation Status Notification Given:  Yes    Malcolm MetroChildress, Sharnae Winfree Demske, RN 11/20/2015, 3:24 PM

## 2015-11-20 NOTE — Progress Notes (Signed)
Subjective: This is a patient of Dr. Everette Rank who was admitted yesterday with flulike syndrome. She is negative for influenza. She does have COPD but says she's done well with that. She is coughing up yellow sputum. She is still short of breath.  Objective: Vital signs in last 24 hours: Temp:  [99 F (37.2 C)-99.3 F (37.4 C)] 99.3 F (37.4 C) (11/28 0619) Pulse Rate:  [66-106] 66 (11/28 0619) Resp:  [18-22] 20 (11/28 0619) BP: (136-167)/(47-114) 153/47 mmHg (11/28 0619) SpO2:  [92 %-100 %] 93 % (11/28 0619) Weight:  [91.7 kg (202 lb 2.6 oz)-92.987 kg (205 lb)] 91.7 kg (202 lb 2.6 oz) (11/27 1529) Weight change:  Last BM Date: 11/18/15  Intake/Output from previous day: 11/27 0701 - 11/28 0700 In: 985 [I.V.:985] Out: -   PHYSICAL EXAM General appearance: alert, cooperative and mild distress Resp: rhonchi bilaterally and wheezes bilaterally Cardio: regular rate and rhythm, S1, S2 normal, no murmur, click, rub or gallop GI: soft, non-tender; bowel sounds normal; no masses,  no organomegaly Extremities: extremities normal, atraumatic, no cyanosis or edema  Lab Results:  Results for orders placed or performed during the hospital encounter of 11/19/15 (from the past 48 hour(s))  Basic metabolic panel     Status: Abnormal   Collection Time: 11/19/15 12:15 PM  Result Value Ref Range   Sodium 139 135 - 145 mmol/L   Potassium 3.7 3.5 - 5.1 mmol/L   Chloride 101 101 - 111 mmol/L   CO2 28 22 - 32 mmol/L   Glucose, Bld 143 (H) 65 - 99 mg/dL   BUN 9 6 - 20 mg/dL   Creatinine, Ser 0.61 0.44 - 1.00 mg/dL   Calcium 9.2 8.9 - 10.3 mg/dL   GFR calc non Af Amer >60 >60 mL/min   GFR calc Af Amer >60 >60 mL/min    Comment: (NOTE) The eGFR has been calculated using the CKD EPI equation. This calculation has not been validated in all clinical situations. eGFR's persistently <60 mL/min signify possible Chronic Kidney Disease.    Anion gap 10 5 - 15  CBC with Differential     Status:  Abnormal   Collection Time: 11/19/15 12:15 PM  Result Value Ref Range   WBC 7.7 4.0 - 10.5 K/uL   RBC 5.19 (H) 3.87 - 5.11 MIL/uL   Hemoglobin 10.9 (L) 12.0 - 15.0 g/dL   HCT 36.4 36.0 - 46.0 %   MCV 70.1 (L) 78.0 - 100.0 fL   MCH 21.0 (L) 26.0 - 34.0 pg   MCHC 29.9 (L) 30.0 - 36.0 g/dL   RDW 17.1 (H) 11.5 - 15.5 %   Platelets 315 150 - 400 K/uL   Neutrophils Relative % 60 %   Lymphocytes Relative 23 %   Monocytes Relative 12 %   Eosinophils Relative 4 %   Basophils Relative 1 %   Neutro Abs 4.6 1.7 - 7.7 K/uL   Lymphs Abs 1.8 0.7 - 4.0 K/uL   Monocytes Absolute 0.9 0.1 - 1.0 K/uL   Eosinophils Absolute 0.3 0.0 - 0.7 K/uL   Basophils Absolute 0.1 0.0 - 0.1 K/uL   RBC Morphology POLYCHROMASIA PRESENT     Comment: TEARDROP CELLS  I-Stat CG4 Lactic Acid, ED     Status: None   Collection Time: 11/19/15 12:34 PM  Result Value Ref Range   Lactic Acid, Venous 0.84 0.5 - 2.0 mmol/L  Influenza panel by PCR (type A & B, H1N1)     Status: None   Collection  Time: 11/19/15  3:00 PM  Result Value Ref Range   Influenza A By PCR NEGATIVE NEGATIVE   Influenza B By PCR NEGATIVE NEGATIVE   H1N1 flu by pcr NOT DETECTED NOT DETECTED    Comment:        The Xpert Flu assay (FDA approved for nasal aspirates or washes and nasopharyngeal swab specimens), is intended as an aid in the diagnosis of influenza and should not be used as a sole basis for treatment.   Glucose, capillary     Status: Abnormal   Collection Time: 11/19/15  5:05 PM  Result Value Ref Range   Glucose-Capillary 284 (H) 65 - 99 mg/dL  Glucose, capillary     Status: Abnormal   Collection Time: 11/19/15  9:21 PM  Result Value Ref Range   Glucose-Capillary 203 (H) 65 - 99 mg/dL   Comment 1 Notify RN    Comment 2 Document in Chart   Glucose, capillary     Status: Abnormal   Collection Time: 11/20/15  7:44 AM  Result Value Ref Range   Glucose-Capillary 148 (H) 65 - 99 mg/dL   Comment 1 Notify RN     ABGS No results for  input(s): PHART, PO2ART, TCO2, HCO3 in the last 72 hours.  Invalid input(s): PCO2 CULTURES No results found for this or any previous visit (from the past 240 hour(s)). Studies/Results: Dg Chest 2 View  11/19/2015  CLINICAL DATA:  Cough, nausea and fever beginning yesterday. EXAM: CHEST  2 VIEW COMPARISON:  02/07/2015, 08/24/2014 and 11/13/2014 FINDINGS: Sternotomy wires unchanged. Lungs are adequately inflated with minimal scarring over the left upper lobe. No focal consolidation or effusion. Cardiomediastinal silhouette and remainder of the exam is unchanged. IMPRESSION: No active cardiopulmonary disease. Electronically Signed   By: Marin Olp M.D.   On: 11/19/2015 12:07    Medications:  Prior to Admission:  Prescriptions prior to admission  Medication Sig Dispense Refill Last Dose  . albuterol (PROVENTIL) (2.5 MG/3ML) 0.083% nebulizer solution Take 3 mLs (2.5 mg total) by nebulization every 6 (six) hours as needed for wheezing or shortness of breath. 75 mL 12 unknown  . aspirin EC 325 MG tablet Take 325 mg by mouth daily.   11/18/2015 at Unknown time  . diazepam (VALIUM) 5 MG tablet Take 5 mg by mouth every 6 (six) hours as needed for anxiety.   Past Week at Unknown time  . Insulin Degludec (TRESIBA FLEXTOUCH) 200 UNIT/ML SOPN Inject 35-70 Units into the skin 2 (two) times daily. 35 units in the morning and 70 units in the evening   11/19/2015 at Unknown time  . insulin lispro (HUMALOG) 100 UNIT/ML injection Inject 20 Units into the skin 3 (three) times daily. Or as directed per sliding scale   11/19/2015 at Unknown time  . levothyroxine (SYNTHROID, LEVOTHROID) 25 MCG tablet Take 25 mcg by mouth every morning.    Past Week at Unknown time  . lisinopril (PRINIVIL,ZESTRIL) 10 MG tablet Take 1 tablet by mouth every morning.    11/19/2015 at Unknown time  . metFORMIN (GLUCOPHAGE) 500 MG tablet Take 500 mg by mouth 2 (two) times daily with a meal.     11/19/2015 at Unknown time  .  nitroGLYCERIN (NITROSTAT) 0.4 MG SL tablet Place 0.4 mg under the tongue every 5 (five) minutes as needed for chest pain.    unknown  . pravastatin (PRAVACHOL) 20 MG tablet Take 20 mg by mouth every evening.    11/18/2015 at Unknown time  .  zolpidem (AMBIEN) 5 MG tablet Take 5 mg by mouth at bedtime as needed for sleep.    unknown at Unknown time  . [DISCONTINUED] acetaminophen-codeine (TYLENOL #3) 300-30 MG tablet Take 1 tablet by mouth every 4 (four) hours as needed for moderate pain.    11/19/2015 at Unknown time  . [DISCONTINUED] naproxen sodium (ANAPROX) 220 MG tablet Take 220 mg by mouth daily as needed (pain).   unknown  . [DISCONTINUED] meloxicam (MOBIC) 15 MG tablet Take 15 mg by mouth daily.   2 weeks   Scheduled: . aspirin EC  325 mg Oral Daily  . doxycycline  100 mg Oral Q12H  . enoxaparin (LOVENOX) injection  40 mg Subcutaneous Q24H  . guaiFENesin  600 mg Oral BID  . insulin aspart  0-5 Units Subcutaneous QHS  . insulin aspart  0-9 Units Subcutaneous TID WC  . insulin aspart  10 Units Subcutaneous TID WC  . insulin detemir  35 Units Subcutaneous BID  . levothyroxine  25 mcg Oral QAC breakfast  . lisinopril  10 mg Oral q morning - 10a  . oseltamivir  75 mg Oral BID  . pravastatin  20 mg Oral QPM  . predniSONE  40 mg Oral Q breakfast   Continuous: . sodium chloride 75 mL/hr at 11/19/15 1711   WLK:[id number] **OR** acetaminophen, albuterol, alum & mag hydroxide-simeth, diazepam, diphenhydrAMINE, HYDROcodone-acetaminophen, HYDROcodone-homatropine, nitroGLYCERIN, ondansetron **OR** ondansetron (ZOFRAN) IV, polyethylene glycol, zolpidem  Assesment: She has a flulike illness. She probably has acute bronchitis with COPD exacerbation so she is going to need to be treated for that since it does not appear to be influenza. She has a history of cardiac disease but is not having any chest pain. She has hypertension which is stable. She has diabetes which is doing okay. Principal  Problem:   Influenza-like illness Active Problems:   Chronic obstructive pulmonary disease (HCC)   Bronchitis   Anemia of chronic disease   OSA (obstructive sleep apnea)    Plan: Add prednisone and doxycycline. I told her this will cause trouble with her blood sugar and she understands. Continue other treatments      Sha Burling L 11/20/2015, 9:16 AM

## 2015-11-20 NOTE — Care Management Note (Signed)
Case Management Note  Patient Details  Name: [name] [name] MRN: [id number] Date of Birth: 10-Oct-1936  Subjective/Objective:                  Pt is from home, lives with her husband and is ind with ADL's. Pt admitted with viral illness. Pt has cane that she uses PRN and CPAP. Pt has no HH services prior to admission.   Action/Plan: Pt anticipates she will DC home with self care. No CM needs anticipated.   Expected Discharge Date:   11/20/2015               Expected Discharge Plan:  Home/Self Care  In-House Referral:  NA  Discharge planning Services  CM Consult  Post Acute Care Choice:  NA Choice offered to:  NA  DME Arranged:    DME Agency:     HH Arranged:    HH Agency:     Status of Service:  Completed, signed off  Medicare Important Message Given:    Date Medicare IM Given:    Medicare IM give by:    Date Additional Medicare IM Given:    Additional Medicare Important Message give by:     If discussed at Long Length of Stay Meetings, dates discussed:    Additional Comments:  Malcolm MetroChildress, Johnpaul Gillentine Demske, RN 11/20/2015, 3:27 PM

## 2015-11-21 LAB — GLUCOSE, CAPILLARY
GLUCOSE-CAPILLARY: 275 mg/dL — AB (ref 65–99)
GLUCOSE-CAPILLARY: 307 mg/dL — AB (ref 65–99)
Glucose-Capillary: 337 mg/dL — ABNORMAL HIGH (ref 65–99)
Glucose-Capillary: 254 mg/dL — ABNORMAL HIGH (ref 65–99)
Glucose-Capillary: 248 mg/dL — ABNORMAL HIGH (ref 65–99)

## 2015-11-21 LAB — IRON AND TIBC
IRON: 15 ug/dL — AB (ref 28–170)
Saturation Ratios: 5 % — ABNORMAL LOW (ref 10.4–31.8)
TIBC: 330 ug/dL (ref 250–450)
UIBC: 315 ug/dL

## 2015-11-21 LAB — VITAMIN B12: VITAMIN B 12: 158 pg/mL — AB (ref 180–914)

## 2015-11-21 LAB — RETICULOCYTES
RBC.: 4.89 MIL/uL (ref 3.87–5.11)
RETIC COUNT ABSOLUTE: 63.6 10*3/uL (ref 19.0–186.0)
Retic Ct Pct: 1.3 % (ref 0.4–3.1)

## 2015-11-21 LAB — FERRITIN: FERRITIN: 7 ng/mL — AB (ref 11–307)

## 2015-11-21 LAB — FOLATE: Folate: 25 ng/mL (ref >5.9)

## 2015-11-21 MED ORDER — METHYLPREDNISOLONE SODIUM SUCC 125 MG IJ SOLR
60.0000 mg | Freq: Two times a day (BID) | INTRAMUSCULAR | Status: DC
  Administered 2015-11-21 – 2015-11-22 (×3): 60 mg via INTRAVENOUS
  Filled 2015-11-21 (×3): qty 2

## 2015-11-21 NOTE — Progress Notes (Signed)
Pt refuse to wear CPAP machine states that mask leaks and she can't wear it. I offer to switch mask but she wasn't with it. She states she can't wear the full face mask.

## 2015-11-21 NOTE — Progress Notes (Addendum)
Dr. Otilio Saberondiego's office called - voice mail message left - regarding patient being on Tamiflu and not being on droplet precautions. Per Infectious disease RN the patient needs to be on droplet precautions if taking tamiflu. Patient's flu swab negative. Will await return phone call from Dr. Janna Archondiego.  Will continue to monitor. - Harold Moncus,RN   1200: spoke with Dr. Janna Archondiego who wishes patient to remain on Tamiflu. Order given and placed for droplet precautions. Will continue to monitor. Asher Muir- Johntay Doolen,RN

## 2015-11-21 NOTE — Progress Notes (Signed)
Patient admitted with viral syndrome has COPD and insulin-dependent diabetes placed on doxycycline as well as prednisone yesterday some hyperglycemia noted patient declares she has myalgias [name] [name] [id number] DOB: [birth year] DOA: 11/19/2015 PCP: [name] [name]             Physical Exam: Blood pressure 148/85, pulse 67, temperature 98.5 F (36.9 C), temperature source Oral, resp. rate 20, height 5\' 4"  (1.626 [name]), weight 202 lb 2.6 oz (91.7 kg), SpO2 95 %. lungs show mild end expiratory wheeze scattered rhonchi no rales appreciable prolonged expiratory phase noted heart regular rhythm no S3 or S4 no heaves thrills rubs   Investigations:  No results found for this or any previous visit (from the past 240 hour(s)).   Basic Metabolic Panel:  Recent Labs  56/21/3011/27/16 1215  NA 139  K 3.7  CL 101  CO2 28  GLUCOSE 143*  BUN 9  CREATININE 0.61  CALCIUM 9.2   Liver Function Tests: No results for input(s): AST, ALT, ALKPHOS, BILITOT, PROT, ALBUMIN in the last 72 hours.   CBC:  Recent Labs  11/19/15 1215  WBC 7.7  NEUTROABS 4.6  HGB 10.9*  HCT 36.4  MCV 70.1*  PLT 315    Dg Chest 2 View  11/19/2015  CLINICAL DATA:  Cough, nausea and fever beginning yesterday. EXAM: CHEST  2 VIEW COMPARISON:  02/07/2015, 08/24/2014 and 11/13/2014 FINDINGS: Sternotomy wires unchanged. Lungs are adequately inflated with minimal scarring over the left upper lobe. No focal consolidation or effusion. Cardiomediastinal silhouette and remainder of the exam is unchanged. IMPRESSION: No active cardiopulmonary disease. Electronically Signed   By: [name] [name].D.   On: 11/19/2015 12:07      Medications:   Impression: IDDM  Principal Problem:   Influenza-like illness Active Problems:   Chronic obstructive pulmonary disease (HCC)   Bronchitis   Anemia of chronic disease   OSA (obstructive sleep apnea)     Plan: Continue NovoLog before meals 3 times a day as well as  receive a monitor glycemic control continue prednisone as ordered monitor constitutional symptoms of viral syndrome ambulate patient  Consultants:    Procedures   Antibiotics: Doxycycline                  Code Status: Full  Family Communication:  Spoke with patient about positive expectations for recovery  Disposition Plan see plan above  Time spent: 30 minutes     [name] [name]   11/21/2015, 6:33 AM

## 2015-11-21 NOTE — Progress Notes (Addendum)
Inpatient Diabetes Program Recommendations  AACE/ADA: New Consensus Statement on Inpatient Glycemic Control (2015)  Target Ranges:  Prepandial:   less than 140 mg/dL      Peak postprandial:   less than 180 mg/dL (1-2 hours)      Critically ill patients:  140 - 180 mg/dL    Results for [name] [name], [name] [name] (MRN [id number]) as of 11/21/2015 09:30  Ref. Range 11/20/2015 07:44 11/20/2015 11:43 11/20/2015 21:24  Glucose-Capillary Latest Ref Range: 65-99 mg/dL 132148 (H) 440279 (H) 102305 (H)    Results for [name] [name], [name] [name] (MRN [id number]) as of 11/21/2015 09:30  Ref. Range 11/21/2015 07:31  Glucose-Capillary Latest Ref Range: 65-99 mg/dL 347275 (H)    Home DM Meds: Tresiba basal insulin 35 units AM/ 70 units PM        Humalog 20 units tidwc       Metformin 500 mg bid  Current Insulin Orders: Levemir 35 units bid       Novolog Sensitive SSI (0-9 units) TID AC + HS      Novolog 10 units tidwc     -Patient currently getting Prednisone 40 mg daily.  -Glucose levels elevated all day yesterday.    MD- Please consider the following in-hospital insulin adjustments:  1. Increase PM dose of Levemir to 45 units QHS  2. Increase Novolog Meal Coverage to 15 units tidwc     --Will follow patient during hospitalization--  Ambrose FinlandJeannine Johnston Delance Weide RN, MSN, CDE Diabetes Coordinator Inpatient Glycemic Control Team Team Pager: [phone] (8a-5p)

## 2015-11-22 LAB — GLUCOSE, CAPILLARY
Glucose-Capillary: 279 mg/dL — ABNORMAL HIGH (ref 65–99)
Glucose-Capillary: 373 mg/dL — ABNORMAL HIGH (ref 65–99)

## 2015-11-22 MED ORDER — MINOCYCLINE HCL 100 MG PO CAPS: 100.0000 mg | ORAL_CAPSULE | Freq: Two times a day (BID) | ORAL | Status: DC

## 2015-11-22 NOTE — Progress Notes (Signed)
Discharge instructions and prescriptions given, verbalized understanding, out in stable condition via w/c with student nurse.

## 2015-11-22 NOTE — Discharge Summary (Signed)
Physician Discharge Summary  [name] [name] JXB:[id number] DOB: 1936-08-15 DOA: 11/19/2015  PCP: [name] Reichert, MD  Admit date: 11/19/2015 Discharge date: 11/22/2015   Recommendations for Outpatient Follow-up:  The patient is advised to monitor glucoses carefully due to persistent flexion of steroids on hyperglycemia to take doxycycline 100 mg by mouth twice a day for 7 days. The wall insulin all other home medicines she is invited to follow up in my office in 1-2 weeks time for any issues until Dr. Megan [name] recovers Discharge Diagnoses:  Principal Problem:   Influenza-like illness Active Problems:   Chronic obstructive pulmonary disease (HCC)   Bronchitis   Anemia of chronic disease   OSA (obstructive sleep apnea)   Discharge Condition: Good  Filed Weights   11/19/15 1120 11/19/15 1529  Weight: 205 lb (92.987 kg) 202 lb 2.6 oz (91.7 kg)    History of present illness:  Patient is status post CVA with sleep apnea and hypertension hyperlipidemia and insulin and diabetes who had a generalized febrile illness with including myalgias and elevated white count he was negative for influenza or strictures since initial clinical Global. Infection due to the cost of symptoms she was given febrile illness probably viral in origin we'll treat empirically place him back 600 by mouth twice a day this was continued as an outpatient does have COPD since mild wheezing was given intravenous steroids and did make some hyperglycemia as a result of the stenosis was corrected to read within a day or 2 of her discharge she is told to monitor blood sugars frequently in the next 2 days increase insulin accordingly  Hospital Course:  See history of present illness  Procedures:    Consultations:    Discharge Instructions  Discharge Instructions    Discharge instructions    Complete by:  As directed      Discharge patient    Complete by:  As directed             Medication List      TAKE these medications        albuterol (2.5 MG/3ML) 0.083% nebulizer solution  Commonly known as:  PROVENTIL  Take 3 mLs (2.5 mg total) by nebulization every 6 (six) hours as needed for wheezing or shortness of breath.     aspirin EC 325 MG tablet  Take 325 mg by mouth daily.     diazepam 5 MG tablet  Commonly known as:  VALIUM  Take 5 mg by mouth every 6 (six) hours as needed for anxiety.     insulin lispro 100 UNIT/ML injection  Commonly known as:  HUMALOG  Inject 20 Units into the skin 3 (three) times daily. Or as directed per sliding scale     levothyroxine 25 MCG tablet  Commonly known as:  SYNTHROID, LEVOTHROID  Take 25 mcg by mouth every morning.     lisinopril 10 MG tablet  Commonly known as:  PRINIVIL,ZESTRIL  Take 1 tablet by mouth every morning.     metFORMIN 500 MG tablet  Commonly known as:  GLUCOPHAGE  Take 500 mg by mouth 2 (two) times daily with a meal.     minocycline 100 MG capsule  Commonly known as:  MINOCIN,DYNACIN  Take 1 capsule (100 mg total) by mouth 2 (two) times daily.     nitroGLYCERIN 0.4 MG SL tablet  Commonly known as:  NITROSTAT  Place 0.4 mg under the tongue every 5 (five) minutes as needed for chest pain.  pravastatin 20 MG tablet  Commonly known as:  PRAVACHOL  Take 20 mg by mouth every evening.     TRESIBA FLEXTOUCH 200 UNIT/ML Sopn  Generic drug:  Insulin Degludec  Inject 35-70 Units into the skin 2 (two) times daily. 35 units in the morning and 70 units in the evening     zolpidem 5 MG tablet  Commonly known as:  AMBIEN  Take 5 mg by mouth at bedtime as needed for sleep.       Allergies  Allergen Reactions  . Codeine Itching      The results of significant diagnostics from this hospitalization (including imaging, microbiology, ancillary and laboratory) are listed below for reference.    Significant Diagnostic Studies: Dg Chest 2 View  11/19/2015  CLINICAL DATA:  Cough, nausea and fever beginning yesterday.  EXAM: CHEST  2 VIEW COMPARISON:  02/07/2015, 08/24/2014 and 11/13/2014 FINDINGS: Sternotomy wires unchanged. Lungs are adequately inflated with minimal scarring over the left upper lobe. No focal consolidation or effusion. Cardiomediastinal silhouette and remainder of the exam is unchanged. IMPRESSION: No active cardiopulmonary disease. Electronically Signed   By: Elberta Fortisaniel  Boyle M.D.   On: 11/19/2015 12:07    Microbiology: No results found for this or any previous visit (from the past 240 hour(s)).   Labs: Basic Metabolic Panel:  Recent Labs Lab 11/19/15 1215  NA 139  K 3.7  CL 101  CO2 28  GLUCOSE 143*  BUN 9  CREATININE 0.61  CALCIUM 9.2   Liver Function Tests: No results for input(s): AST, ALT, ALKPHOS, BILITOT, PROT, ALBUMIN in the last 168 hours. No results for input(s): LIPASE, AMYLASE in the last 168 hours. No results for input(s): AMMONIA in the last 168 hours. CBC:  Recent Labs Lab 11/19/15 1215  WBC 7.7  NEUTROABS 4.6  HGB 10.9*  HCT 36.4  MCV 70.1*  PLT 315   Cardiac Enzymes: No results for input(s): CKTOTAL, CKMB, CKMBINDEX, TROPONINI in the last 168 hours. BNP: BNP (last 3 results) No results for input(s): BNP in the last 8760 hours.  ProBNP (last 3 results) No results for input(s): PROBNP in the last 8760 hours.  CBG:  Recent Labs Lab 11/21/15 1127 11/21/15 1646 11/21/15 2132 11/22/15 0720 11/22/15 1116  GLUCAP 254* 248* 307* 279* 373*       Signed:  Tyvion [name] M  [facility] Pager: [phone] 11/22/2015, 12:18 PM

## 2015-11-22 NOTE — Care Management Note (Signed)
Case Management Note  Patient Details  Name: [name] [name] MRN: [id number] Date of Birth: 05-May-1936  Pt discharging home today. No CM needs.   Expected Discharge Date:                  Expected Discharge Plan:  Home/Self Care  In-House Referral:  NA  Discharge planning Services  CM Consult  Post Acute Care Choice:  NA Choice offered to:  NA  DME Arranged:    DME Agency:     HH Arranged:    HH Agency:     Status of Service:  Completed, signed off  Medicare Important Message Given:    Date Medicare IM Given:    Medicare IM give by:    Date Additional Medicare IM Given:    Additional Medicare Important Message give by:     If discussed at Long Length of Stay Meetings, dates discussed:    Additional Comments:  Malcolm MetroChildress, Mystery Schrupp Demske, RN 11/22/2015, 12:59 PM

## 2015-12-14 IMAGING — CR DG CHEST 2V
2 series · 2 of 2 positions shown · non-contrast
Comparison: 07/19/2012

CLINICAL DATA: Nausea and emesis

EXAM:
CHEST  2 VIEW

[view not recorded (1 of 2)]
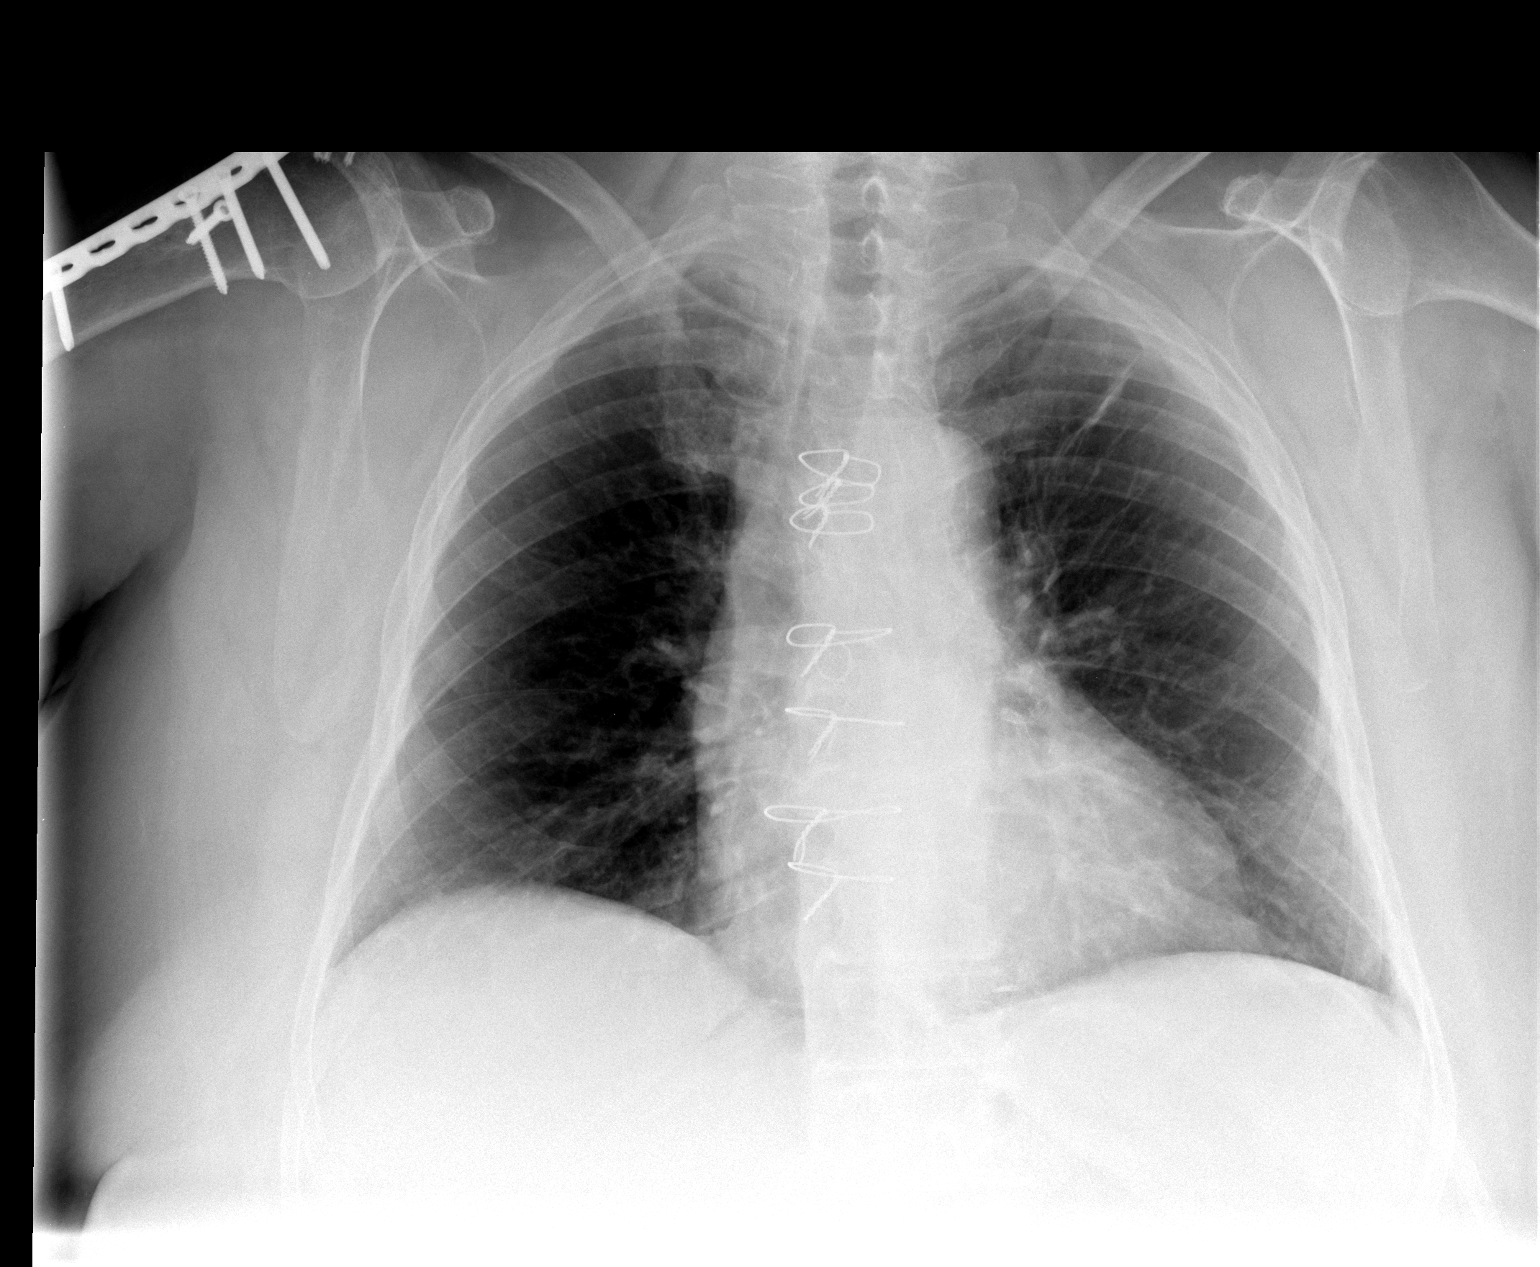

[view not recorded (2 of 2)]
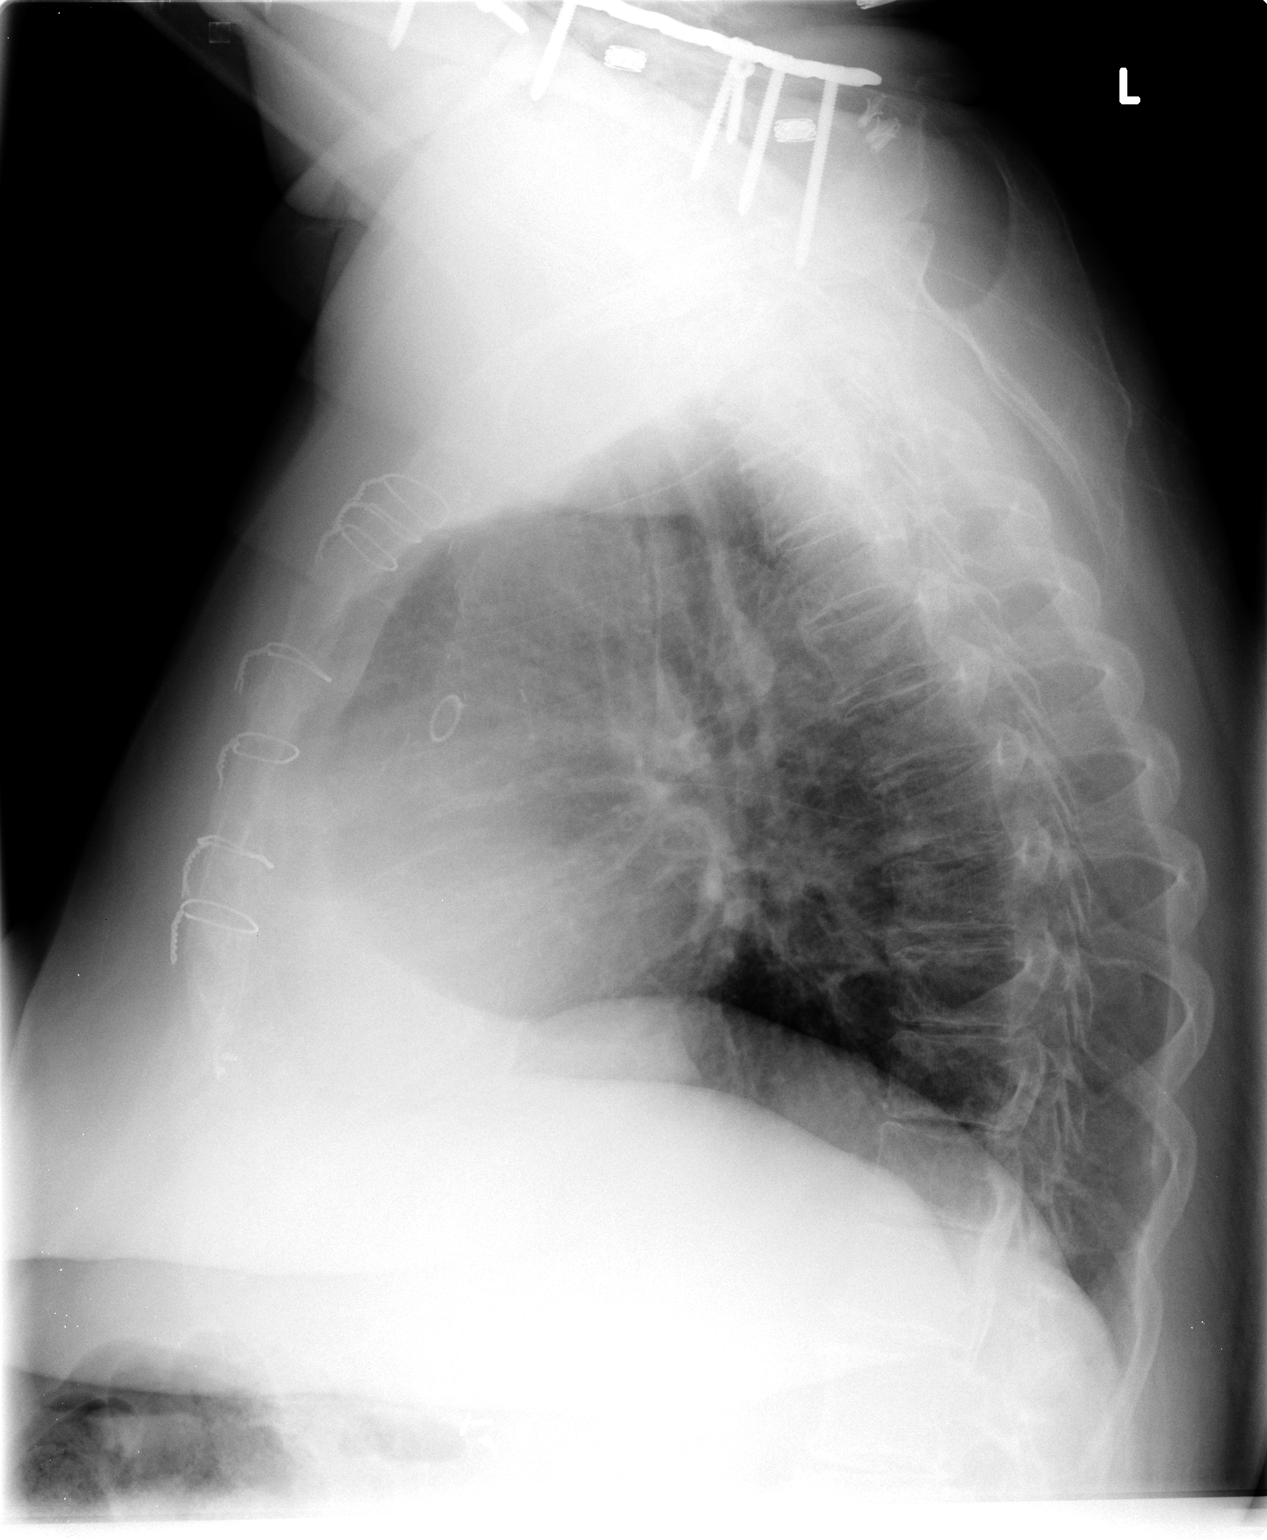

[2 of 2 positions shown; findings below may reference images not displayed]

FINDINGS: No cardiomegaly. Coronary artery atherosclerosis, status post CABG.
No acute infiltrate or edema. No effusion or pneumothorax. Chronic
left upper lobe scar. The status post ORIF of the proximal right
humerus.
IMPRESSION: No active cardiopulmonary disease.

## 2015-12-15 IMAGING — US US RENAL
1 series · 14 of 24 positions shown · non-contrast
Comparison: Ultrasound abdomen 05/28/2010.

CLINICAL DATA: UTI.  Flank pain.

EXAM:
RENAL/URINARY TRACT ULTRASOUND COMPLETE

[Series 1: us renal · 0.26mm/px · 14 of 24 slices shown]
[im 1/24]
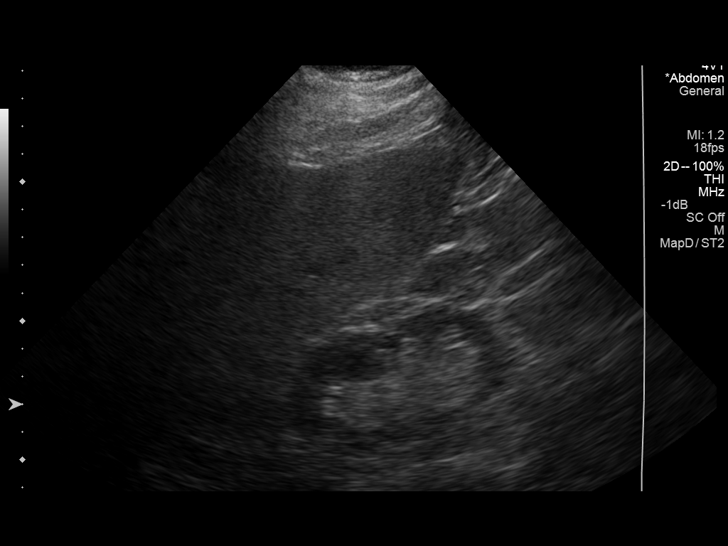
[im 3/24]
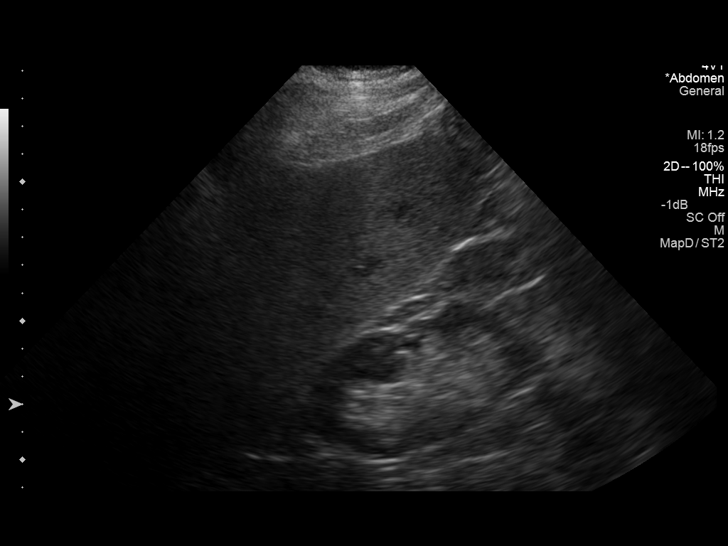
[im 5/24]
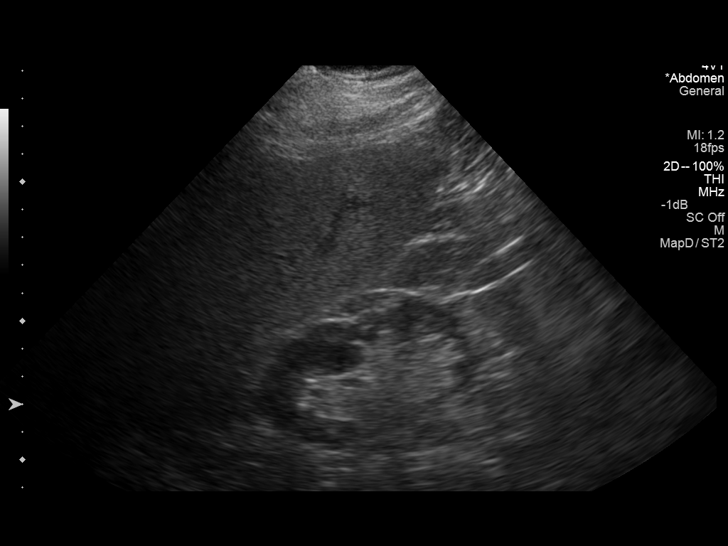
[im 7/24]
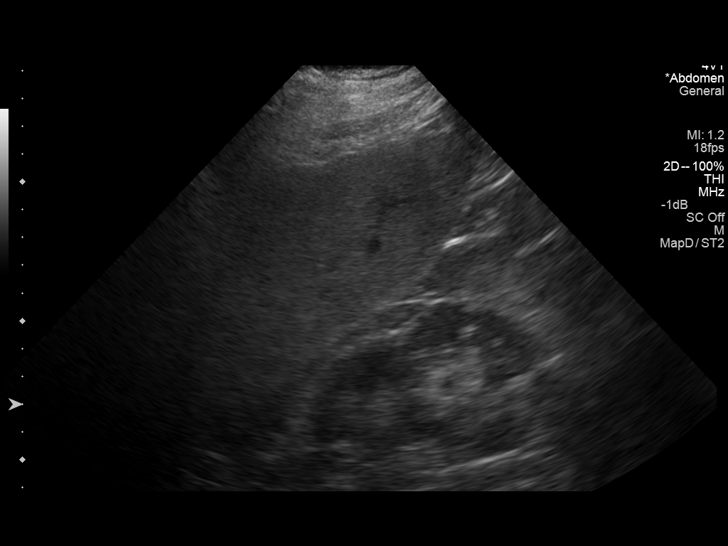
[im 8/24]
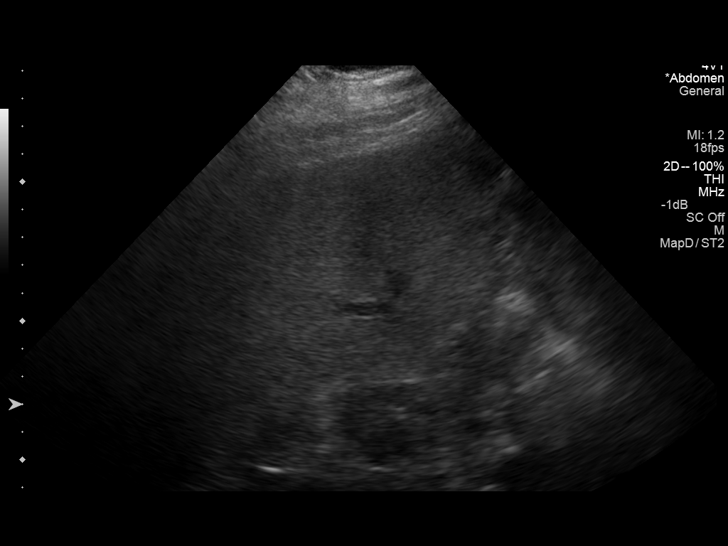
[im 10/24]
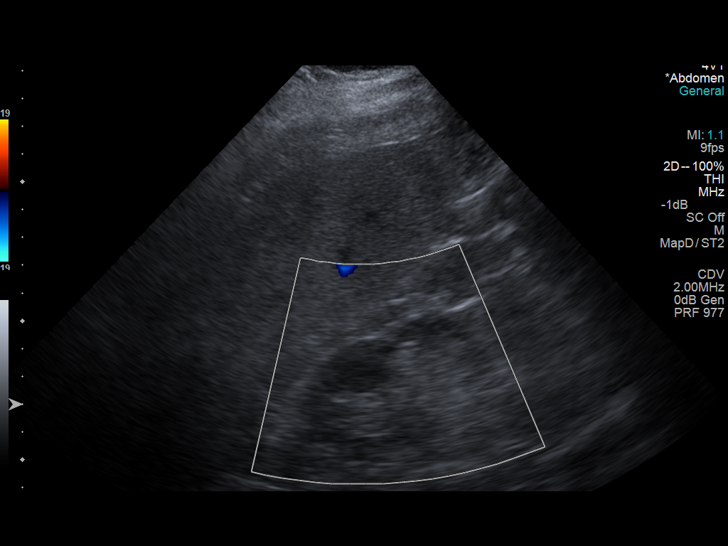
[im 12/24]
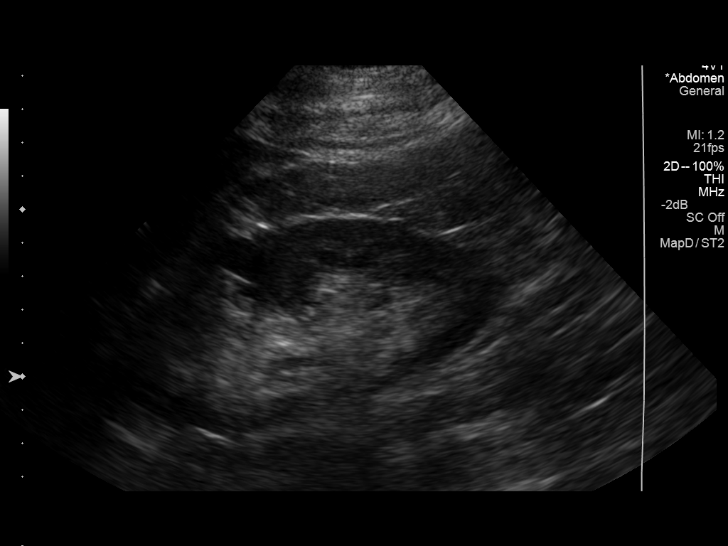
[im 13/24]
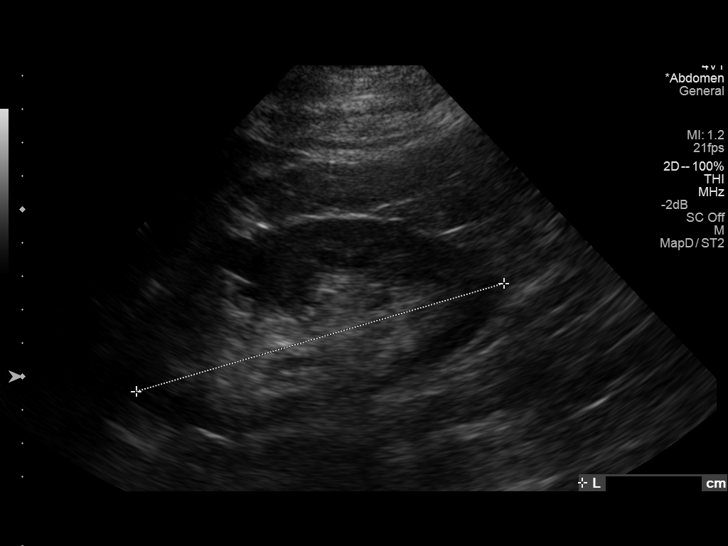
[im 15/24]
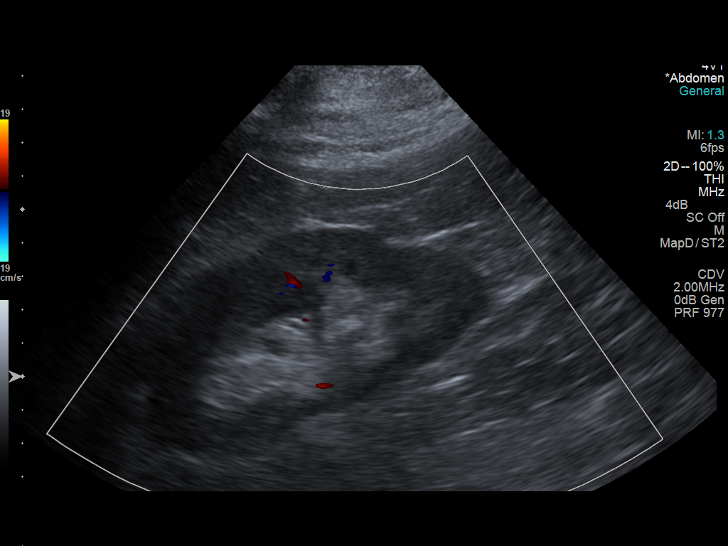
[im 17/24]
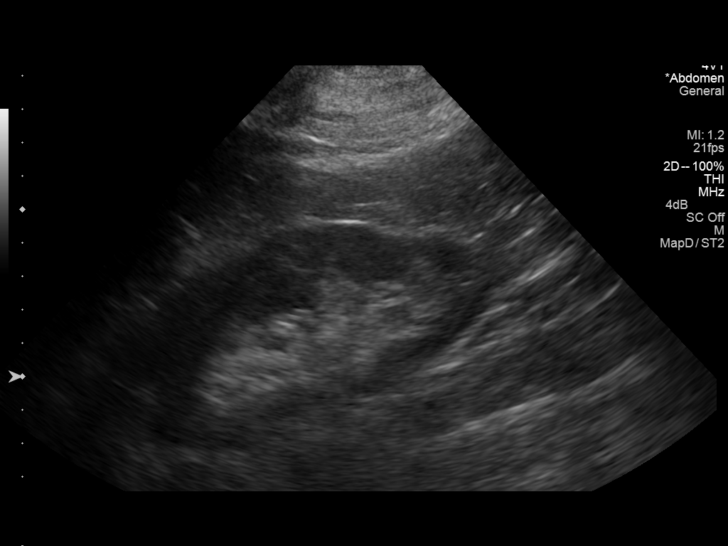
[im 19/24]
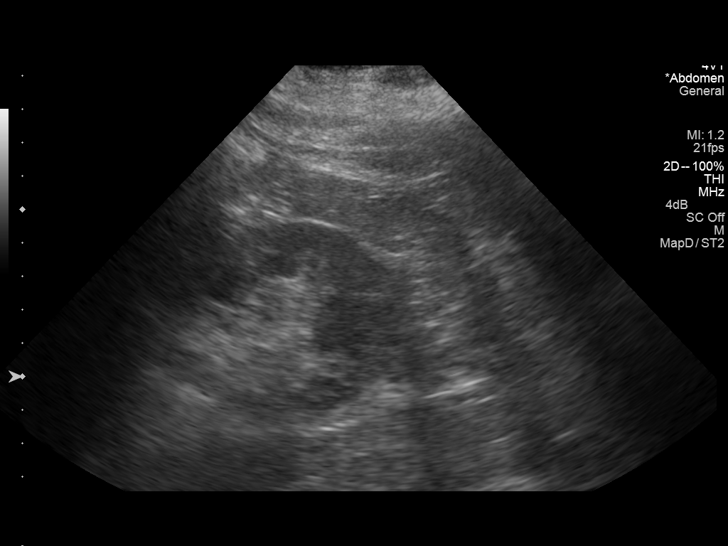
[im 20/24]
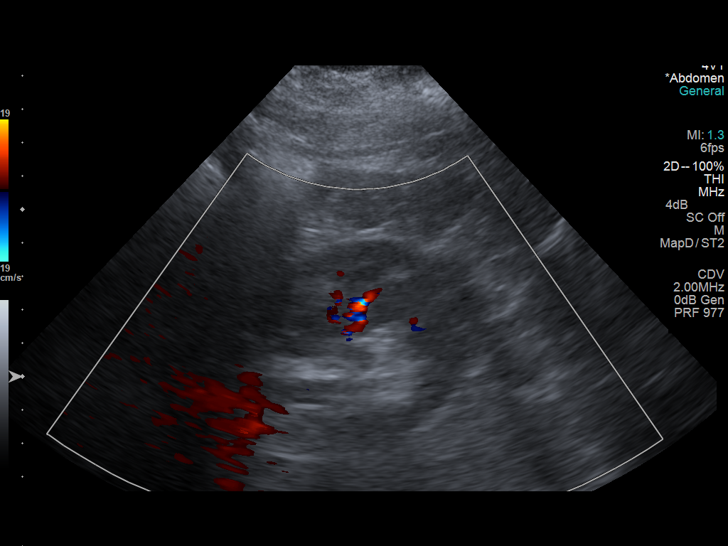
[im 22/24]
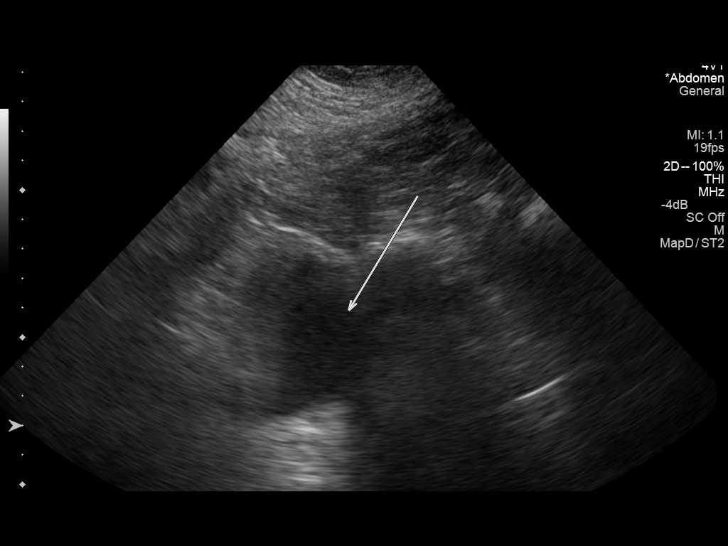
[im 24/24]
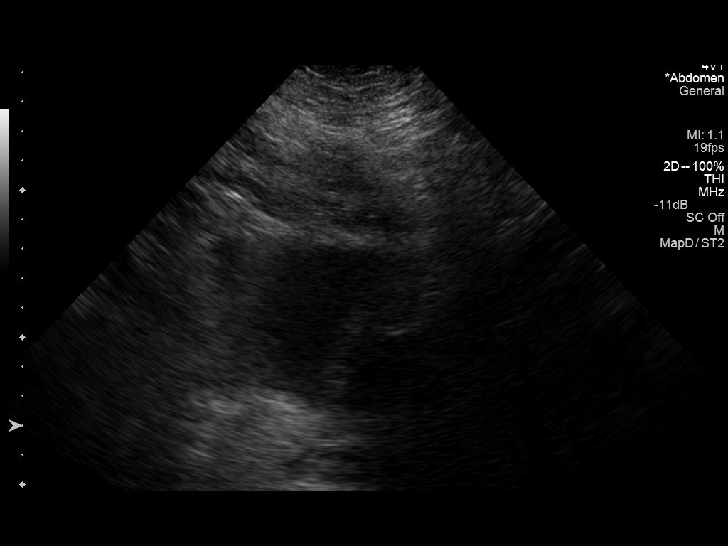

[14 of 24 positions shown; findings below may reference images not displayed]

FINDINGS: Right Kidney:

Length: 8.4 cm, this is diminished from prior scan of 05/28/2010 at
which time the right kidney measured 11.2 cm in length. Cortical
thinning is present. No hydronephrosis.

Left Kidney:

Length: Lab 0.5 cm.. Echogenicity within normal limits. No mass or
hydronephrosis visualized.

Bladder:

Appears normal for degree of bladder distention.

Incidental note is made of slight Liver hyper echogenicity
suggesting fatty infiltration.
IMPRESSION: 1. Decreased size right kidney with cortical thinning. A process
such is atherosclerotic vascular disease could present in this
fashion . The kidney is diminished in size from 05/28/2010 from
cm to 8.4 cm in length.
2. No hydronephrosis.
3. Fatty infiltration of the liver incidentally noted.

## 2015-12-16 IMAGING — CR DG CHEST 1V PORT
1 series · 1 of 1 positions shown · non-contrast
Comparison: Yesterday

CLINICAL DATA: Short of breath.  Confused.

EXAM:
PORTABLE CHEST - 1 VIEW

[portable]
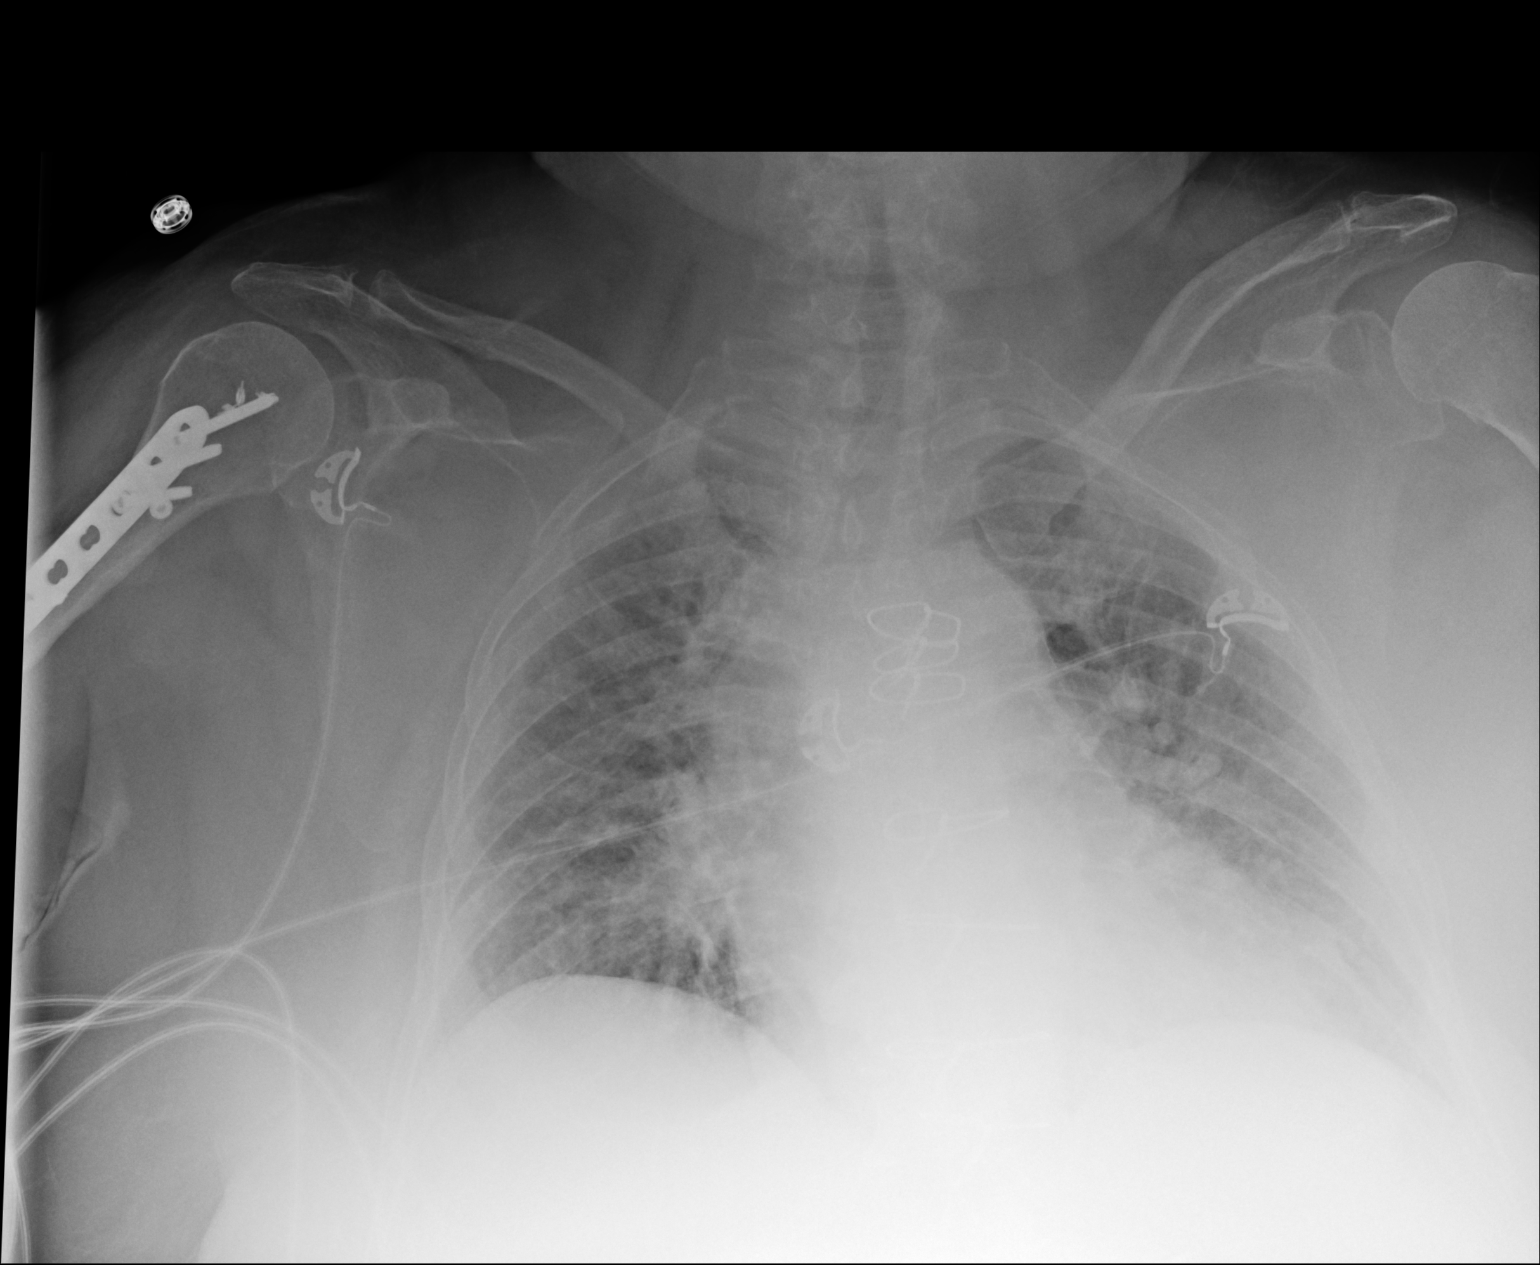

[1 of 1 positions shown; findings below may reference images not displayed]

FINDINGS: Vascular congestion and mild interstitial edema have developed.
Cardiomegaly. Low volumes. No pneumothorax.
IMPRESSION: Interval development of mild edema.

## 2015-12-17 IMAGING — CR DG CHEST 2V
2 series · 2 of 2 positions shown · non-contrast
Comparison: 12/11/2013.

CLINICAL DATA: Shortness of breath.

EXAM:
CHEST  2 VIEW

[pa]
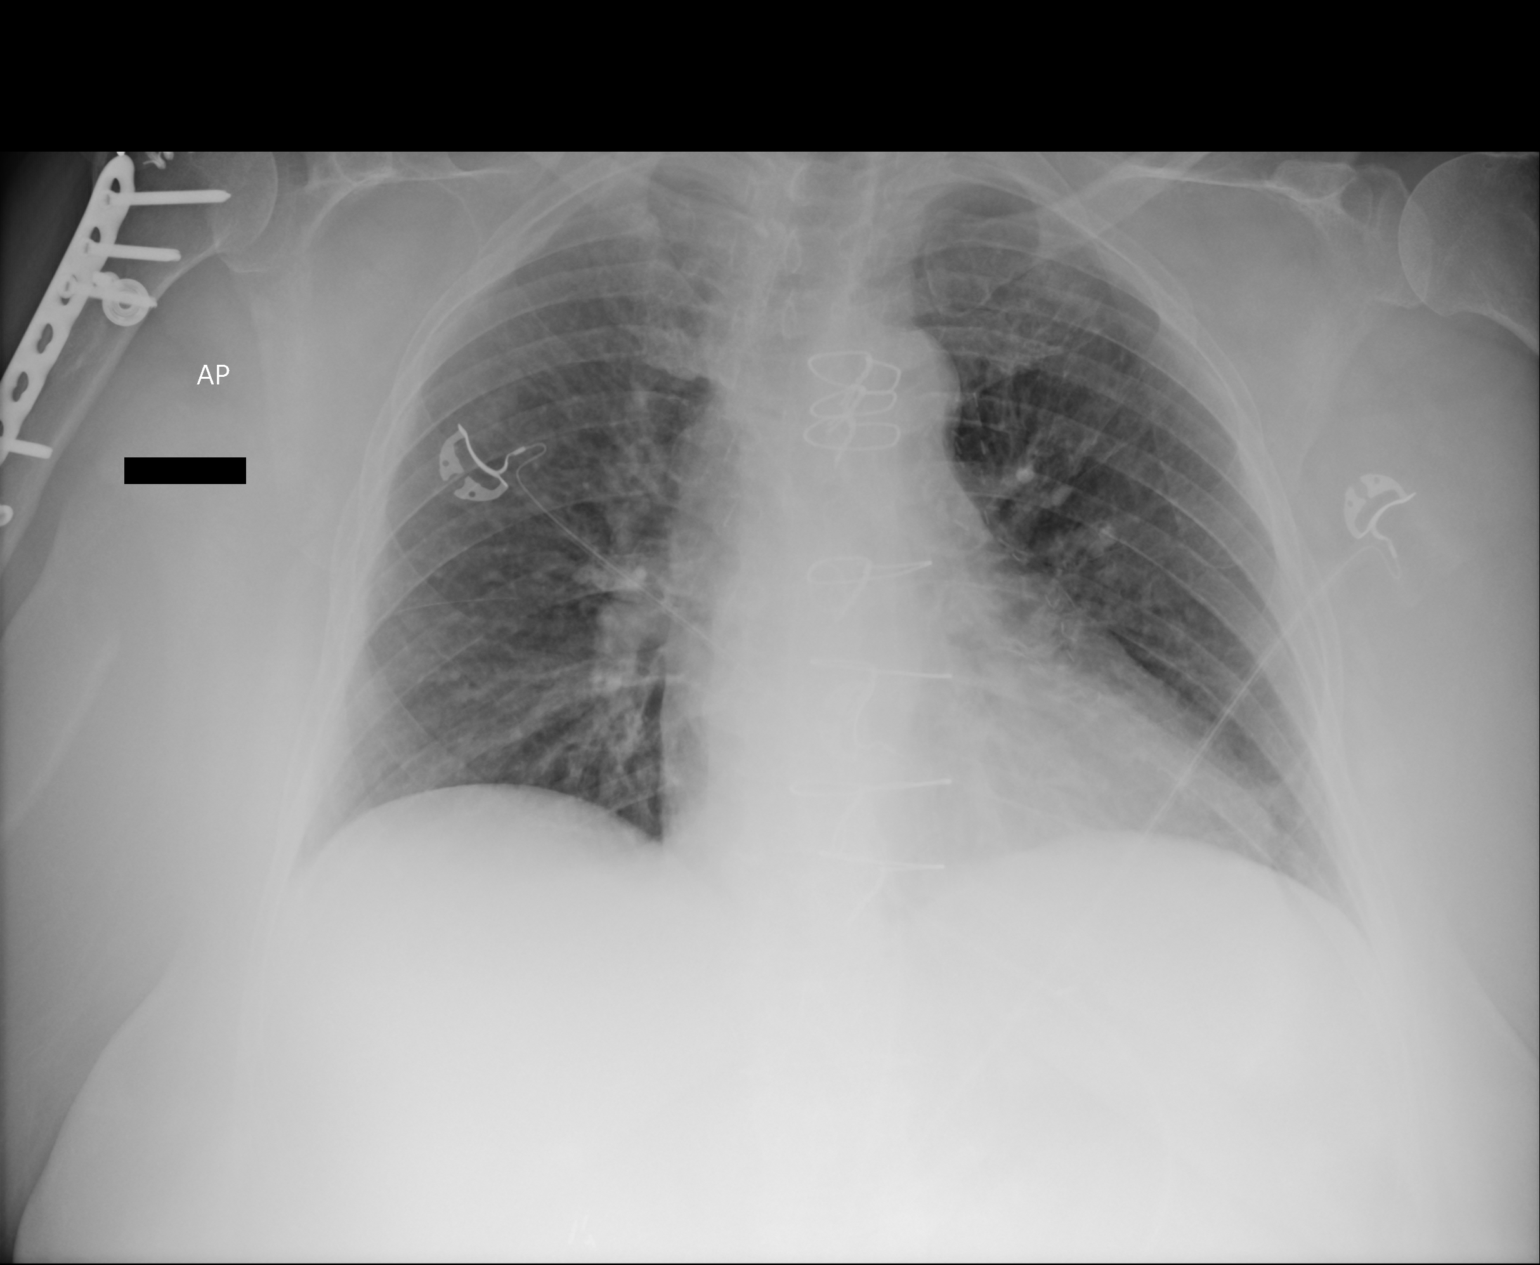

[ap]
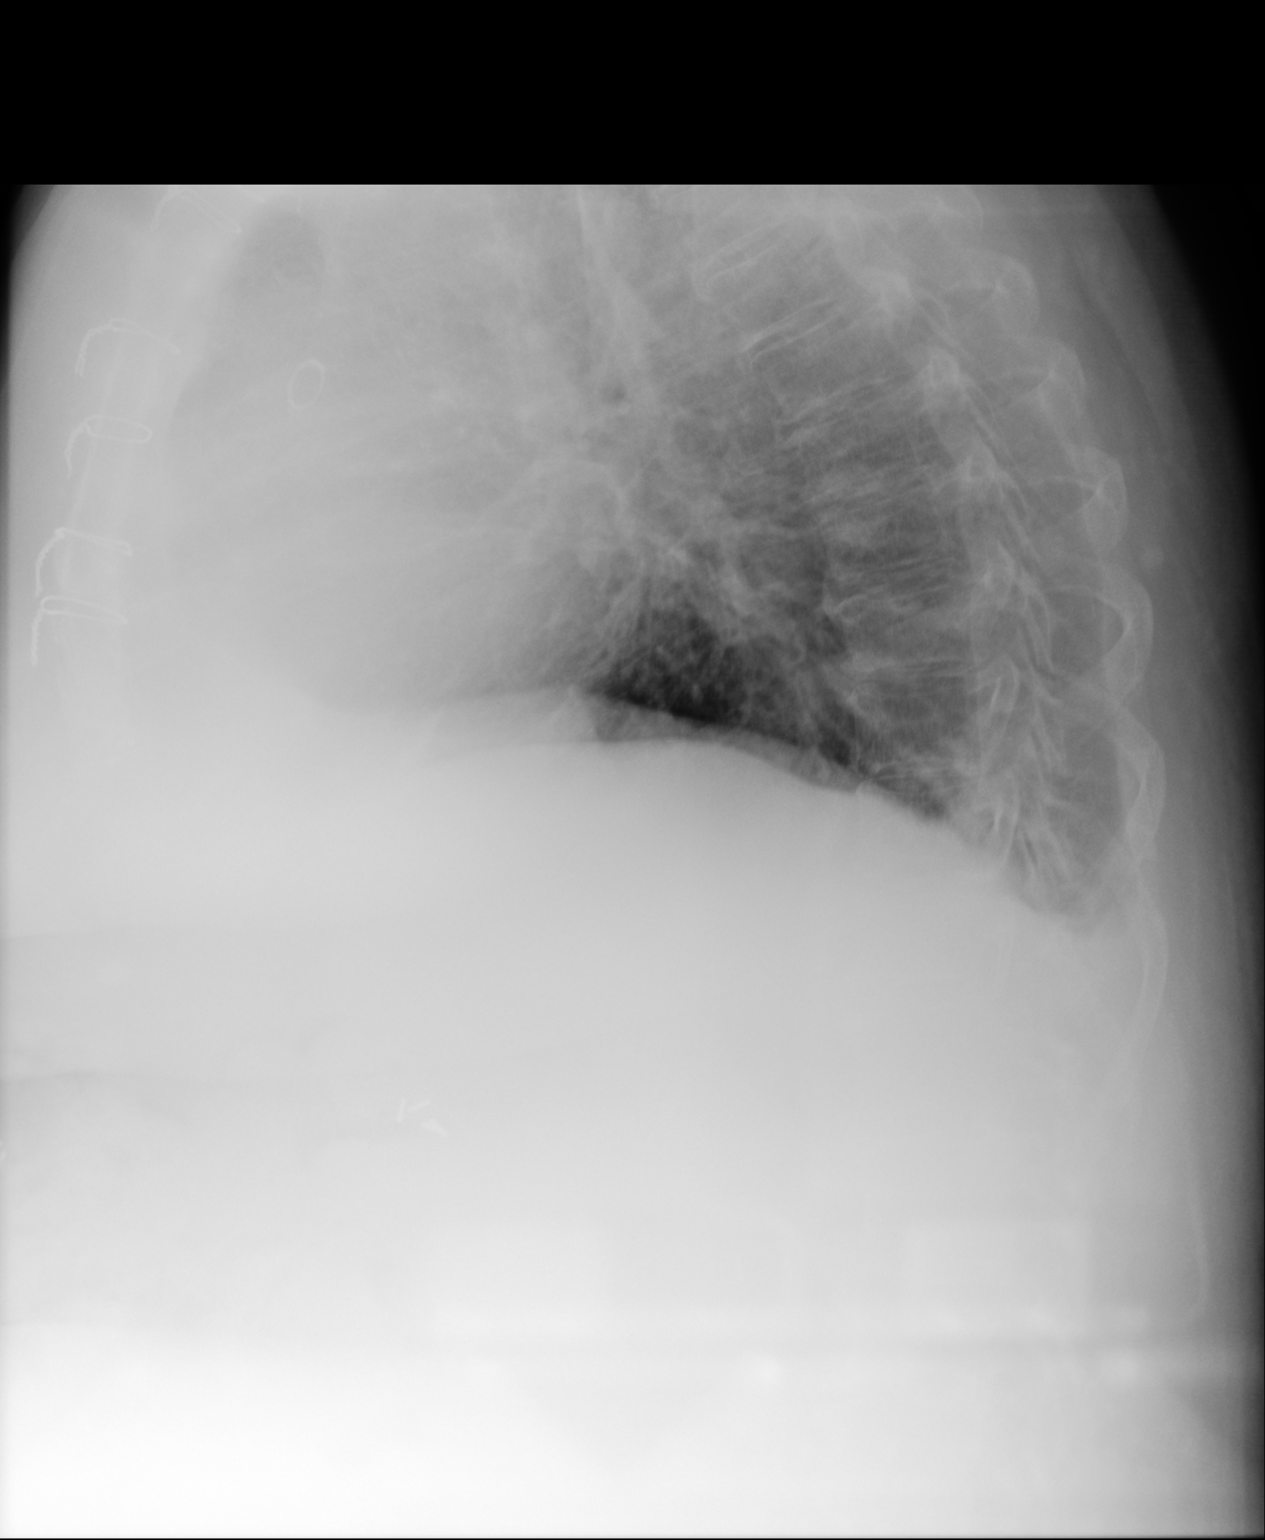

[2 of 2 positions shown; findings below may reference images not displayed]

FINDINGS: Cardiomegaly is again noted. Previously identified pulmonary venous
congestion and interstitial pulmonary edema has partially cleared.
Prior CABG. No focal acute infiltrate or pleural effusion. No
pneumothorax. Postsurgical changes right proximal humerus and
shoulder.
IMPRESSION: Interim partial clearing of congestive heart failure and pulmonary
interstitial edema.

## 2015-12-19 ENCOUNTER — Emergency Department (HOSPITAL_COMMUNITY)
Admission: EM | Admit: 2015-12-19 | Discharge: 2015-12-19 | Discharge status: Against Medical Advice | Payer: Medicare HMO | Attending: Emergency Medicine | Admitting: Emergency Medicine

## 2015-12-19 ENCOUNTER — Encounter (HOSPITAL_COMMUNITY): Payer: Self-pay | Admitting: *Deleted

## 2015-12-19 ENCOUNTER — Emergency Department (HOSPITAL_COMMUNITY): Payer: Medicare HMO

## 2015-12-19 DIAGNOSIS — I10 Essential (primary) hypertension: Secondary | ICD-10-CM | POA: Diagnosis not present

## 2015-12-19 DIAGNOSIS — R531 Weakness: Secondary | ICD-10-CM | POA: Insufficient documentation

## 2015-12-19 DIAGNOSIS — J449 Chronic obstructive pulmonary disease, unspecified: Secondary | ICD-10-CM | POA: Insufficient documentation

## 2015-12-19 DIAGNOSIS — I5030 Unspecified diastolic (congestive) heart failure: Secondary | ICD-10-CM | POA: Diagnosis not present

## 2015-12-19 NOTE — ED Notes (Signed)
Pt wanting to leave without being seen.

## 2015-12-19 NOTE — ED Notes (Signed)
Pt reports feeling "weak" x couple of days. Reports hx of TIA's. Pt alert and oriented, answering all questions appropriately. NAD.

## 2015-12-19 NOTE — ED Notes (Signed)
Pt alert and oriented. NAD 

## 2016-03-18 ENCOUNTER — Emergency Department (HOSPITAL_COMMUNITY): Payer: Medicare HMO

## 2016-03-18 ENCOUNTER — Inpatient Hospital Stay (HOSPITAL_COMMUNITY)
Admission: EM | Admit: 2016-03-18 | Discharge: 2016-03-22 | DRG: 689 | Disposition: A | Discharge status: Home / Self Care | Payer: Medicare HMO | Attending: Family Medicine | Admitting: Family Medicine

## 2016-03-18 ENCOUNTER — Inpatient Hospital Stay (HOSPITAL_COMMUNITY): Payer: Medicare HMO

## 2016-03-18 ENCOUNTER — Encounter (HOSPITAL_COMMUNITY): Payer: Self-pay

## 2016-03-18 DIAGNOSIS — I10 Essential (primary) hypertension: Secondary | ICD-10-CM | POA: Diagnosis present

## 2016-03-18 DIAGNOSIS — J449 Chronic obstructive pulmonary disease, unspecified: Secondary | ICD-10-CM | POA: Diagnosis present

## 2016-03-18 DIAGNOSIS — E785 Hyperlipidemia, unspecified: Secondary | ICD-10-CM | POA: Diagnosis present

## 2016-03-18 DIAGNOSIS — Z8673 Personal history of transient ischemic attack (TIA), and cerebral infarction without residual deficits: Secondary | ICD-10-CM

## 2016-03-18 DIAGNOSIS — E038 Other specified hypothyroidism: Secondary | ICD-10-CM

## 2016-03-18 DIAGNOSIS — Z90710 Acquired absence of both cervix and uterus: Secondary | ICD-10-CM

## 2016-03-18 DIAGNOSIS — G8929 Other chronic pain: Secondary | ICD-10-CM

## 2016-03-18 DIAGNOSIS — Z955 Presence of coronary angioplasty implant and graft: Secondary | ICD-10-CM | POA: Diagnosis not present

## 2016-03-18 DIAGNOSIS — I2510 Atherosclerotic heart disease of native coronary artery without angina pectoris: Secondary | ICD-10-CM | POA: Diagnosis present

## 2016-03-18 DIAGNOSIS — M549 Dorsalgia, unspecified: Secondary | ICD-10-CM

## 2016-03-18 DIAGNOSIS — Z800 Family history of malignant neoplasm of digestive organs: Secondary | ICD-10-CM | POA: Diagnosis not present

## 2016-03-18 DIAGNOSIS — E039 Hypothyroidism, unspecified: Secondary | ICD-10-CM | POA: Diagnosis present

## 2016-03-18 DIAGNOSIS — E119 Type 2 diabetes mellitus without complications: Secondary | ICD-10-CM | POA: Diagnosis present

## 2016-03-18 DIAGNOSIS — G9340 Encephalopathy, unspecified: Secondary | ICD-10-CM | POA: Diagnosis present

## 2016-03-18 DIAGNOSIS — G4733 Obstructive sleep apnea (adult) (pediatric): Secondary | ICD-10-CM | POA: Diagnosis present

## 2016-03-18 DIAGNOSIS — E538 Deficiency of other specified B group vitamins: Secondary | ICD-10-CM | POA: Diagnosis present

## 2016-03-18 DIAGNOSIS — N3000 Acute cystitis without hematuria: Secondary | ICD-10-CM

## 2016-03-18 DIAGNOSIS — Z8249 Family history of ischemic heart disease and other diseases of the circulatory system: Secondary | ICD-10-CM

## 2016-03-18 DIAGNOSIS — Z951 Presence of aortocoronary bypass graft: Secondary | ICD-10-CM | POA: Diagnosis not present

## 2016-03-18 DIAGNOSIS — R29810 Facial weakness: Secondary | ICD-10-CM | POA: Diagnosis present

## 2016-03-18 DIAGNOSIS — I252 Old myocardial infarction: Secondary | ICD-10-CM | POA: Diagnosis not present

## 2016-03-18 DIAGNOSIS — Z7982 Long term (current) use of aspirin: Secondary | ICD-10-CM | POA: Diagnosis not present

## 2016-03-18 DIAGNOSIS — G450 Vertebro-basilar artery syndrome: Secondary | ICD-10-CM

## 2016-03-18 DIAGNOSIS — I639 Cerebral infarction, unspecified: Secondary | ICD-10-CM

## 2016-03-18 DIAGNOSIS — R32 Unspecified urinary incontinence: Secondary | ICD-10-CM | POA: Diagnosis present

## 2016-03-18 DIAGNOSIS — R471 Dysarthria and anarthria: Secondary | ICD-10-CM | POA: Diagnosis not present

## 2016-03-18 DIAGNOSIS — Z794 Long term (current) use of insulin: Secondary | ICD-10-CM | POA: Diagnosis not present

## 2016-03-18 DIAGNOSIS — R410 Disorientation, unspecified: Secondary | ICD-10-CM | POA: Diagnosis not present

## 2016-03-18 DIAGNOSIS — Z9049 Acquired absence of other specified parts of digestive tract: Secondary | ICD-10-CM | POA: Diagnosis not present

## 2016-03-18 DIAGNOSIS — E118 Type 2 diabetes mellitus with unspecified complications: Secondary | ICD-10-CM

## 2016-03-18 DIAGNOSIS — I6350 Cerebral infarction due to unspecified occlusion or stenosis of unspecified cerebral artery: Secondary | ICD-10-CM | POA: Diagnosis not present

## 2016-03-18 DIAGNOSIS — E669 Obesity, unspecified: Secondary | ICD-10-CM

## 2016-03-18 DIAGNOSIS — I679 Cerebrovascular disease, unspecified: Secondary | ICD-10-CM

## 2016-03-18 DIAGNOSIS — N390 Urinary tract infection, site not specified: Secondary | ICD-10-CM

## 2016-03-18 DIAGNOSIS — L409 Psoriasis, unspecified: Secondary | ICD-10-CM

## 2016-03-18 LAB — URINALYSIS, ROUTINE W REFLEX MICROSCOPIC
Bilirubin Urine: NEGATIVE
Glucose, UA: >1000 mg/dL — AB
Hgb urine dipstick: NEGATIVE
Ketones, ur: NEGATIVE mg/dL
Nitrite: POSITIVE — AB
PROTEIN: NEGATIVE mg/dL
Specific Gravity, Urine: 1.01 (ref 1.005–1.030)
pH: 6 (ref 5.0–8.0)

## 2016-03-18 LAB — BLOOD GAS, ARTERIAL
Acid-Base Excess: 2.6 mmol/L — ABNORMAL HIGH (ref 0.0–2.0)
Bicarbonate: 26.5 mEq/L — ABNORMAL HIGH (ref 20.0–24.0)
DRAWN BY: 234301
FIO2: 21
O2 Saturation: 92.7 %
pCO2 arterial: 44.6 mmHg (ref 35.0–45.0)
pH, Arterial: 7.4 (ref 7.350–7.450)
pO2, Arterial: 70.1 mmHg — ABNORMAL LOW (ref 80.0–100.0)

## 2016-03-18 LAB — COMPREHENSIVE METABOLIC PANEL
ALT: 14 U/L (ref 14–54)
AST: 19 U/L (ref 15–41)
Albumin: 3.8 g/dL (ref 3.5–5.0)
Alkaline Phosphatase: 84 U/L (ref 38–126)
Anion gap: 8 (ref 5–15)
BILIRUBIN TOTAL: 0.3 mg/dL (ref 0.3–1.2)
BUN: 8 mg/dL (ref 6–20)
CO2: 27 mmol/L (ref 22–32)
Calcium: 8.9 mg/dL (ref 8.9–10.3)
Chloride: 104 mmol/L (ref 101–111)
Creatinine, Ser: 0.68 mg/dL (ref 0.44–1.00)
Glucose, Bld: 214 mg/dL — ABNORMAL HIGH (ref 65–99)
POTASSIUM: 4.2 mmol/L (ref 3.5–5.1)
Sodium: 139 mmol/L (ref 135–145)
TOTAL PROTEIN: 7 g/dL (ref 6.5–8.1)

## 2016-03-18 LAB — DIFFERENTIAL
BASOS ABS: 0.1 10*3/uL (ref 0.0–0.1)
Basophils Relative: 1 %
EOS ABS: 0.4 10*3/uL (ref 0.0–0.7)
Eosinophils Relative: 5 %
Lymphocytes Relative: 41 %
Lymphs Abs: 3.7 10*3/uL (ref 0.7–4.0)
MONO ABS: 1.1 10*3/uL — AB (ref 0.1–1.0)
MONOS PCT: 12 %
Neutro Abs: 3.8 10*3/uL (ref 1.7–7.7)
Neutrophils Relative %: 41 %

## 2016-03-18 LAB — I-STAT CHEM 8, ED
BUN: 6 mg/dL (ref 6–20)
CALCIUM ION: 1.18 mmol/L (ref 1.13–1.30)
CREATININE: 0.6 mg/dL (ref 0.44–1.00)
Chloride: 101 mmol/L (ref 101–111)
GLUCOSE: 206 mg/dL — AB (ref 65–99)
HCT: 39 % (ref 36.0–46.0)
HEMOGLOBIN: 13.3 g/dL (ref 12.0–15.0)
Potassium: 4.2 mmol/L (ref 3.5–5.1)
Sodium: 141 mmol/L (ref 135–145)
TCO2: 27 mmol/L (ref 0–100)

## 2016-03-18 LAB — RAPID URINE DRUG SCREEN, HOSP PERFORMED
Amphetamines: NOT DETECTED
BARBITURATES: NOT DETECTED
Benzodiazepines: POSITIVE — AB
COCAINE: NOT DETECTED
Opiates: NOT DETECTED
Tetrahydrocannabinol: NOT DETECTED

## 2016-03-18 LAB — GLUCOSE, CAPILLARY
GLUCOSE-CAPILLARY: 285 mg/dL — AB (ref 65–99)
GLUCOSE-CAPILLARY: 265 mg/dL — AB (ref 65–99)

## 2016-03-18 LAB — CBC
HEMATOCRIT: 37.1 % (ref 36.0–46.0)
HEMOGLOBIN: 10.6 g/dL — AB (ref 12.0–15.0)
MCH: 19.8 pg — AB (ref 26.0–34.0)
MCHC: 28.6 g/dL — AB (ref 30.0–36.0)
MCV: 69.2 fL — AB (ref 78.0–100.0)
Platelets: 283 10*3/uL (ref 150–400)
RBC: 5.36 MIL/uL — ABNORMAL HIGH (ref 3.87–5.11)
RDW: 18.8 % — ABNORMAL HIGH (ref 11.5–15.5)
WBC: 9.1 10*3/uL (ref 4.0–10.5)

## 2016-03-18 LAB — APTT: aPTT: 28 seconds (ref 24–37)

## 2016-03-18 LAB — PROTIME-INR
INR: 1.09 (ref 0.00–1.49)
Prothrombin Time: 14.3 seconds (ref 11.6–15.2)

## 2016-03-18 LAB — URINE MICROSCOPIC-ADD ON: RBC / HPF: NONE SEEN RBC/hpf (ref 0–5)

## 2016-03-18 LAB — I-STAT TROPONIN, ED: TROPONIN I, POC: 0 ng/mL (ref 0.00–0.08)

## 2016-03-18 LAB — ETHANOL: Alcohol, Ethyl (B): <5 mg/dL (ref <5)

## 2016-03-18 LAB — TROPONIN I

## 2016-03-18 MED ORDER — ALBUTEROL SULFATE (2.5 MG/3ML) 0.083% IN NEBU: 2.5000 mg | INHALATION_SOLUTION | Freq: Four times a day (QID) | RESPIRATORY_TRACT | Status: DC | PRN

## 2016-03-18 MED ORDER — DEXTROSE 5 % IV SOLN
1.0000 g | Freq: Once | INTRAVENOUS | Status: AC
  Administered 2016-03-18: 1 g via INTRAVENOUS
  Filled 2016-03-18: qty 10

## 2016-03-18 MED ORDER — PRAVASTATIN SODIUM 10 MG PO TABS
20.0000 mg | ORAL_TABLET | Freq: Every evening | ORAL | Status: DC
  Administered 2016-03-18 – 2016-03-19 (×2): 20 mg via ORAL
  Filled 2016-03-18 (×2): qty 2

## 2016-03-18 MED ORDER — INSULIN ASPART PROT & ASPART (70-30 MIX) 100 UNIT/ML ~~LOC~~ SUSP
SUBCUTANEOUS | Status: AC
  Filled 2016-03-18: qty 10

## 2016-03-18 MED ORDER — CLOPIDOGREL BISULFATE 75 MG PO TABS
75.0000 mg | ORAL_TABLET | Freq: Every day | ORAL | Status: DC
  Administered 2016-03-18 – 2016-03-22 (×5): 75 mg via ORAL
  Filled 2016-03-18 (×5): qty 1

## 2016-03-18 MED ORDER — LISINOPRIL 10 MG PO TABS
10.0000 mg | ORAL_TABLET | Freq: Every morning | ORAL | Status: DC
  Administered 2016-03-19 – 2016-03-22 (×4): 10 mg via ORAL
  Filled 2016-03-18 (×4): qty 1

## 2016-03-18 MED ORDER — LEVOTHYROXINE SODIUM 25 MCG PO TABS
25.0000 ug | ORAL_TABLET | Freq: Every day | ORAL | Status: DC
  Administered 2016-03-19 – 2016-03-22 (×4): 25 ug via ORAL
  Filled 2016-03-18 (×4): qty 1

## 2016-03-18 MED ORDER — DIAZEPAM 5 MG PO TABS
5.0000 mg | ORAL_TABLET | Freq: Four times a day (QID) | ORAL | Status: DC | PRN
  Administered 2016-03-18: 5 mg via ORAL
  Filled 2016-03-18: qty 1

## 2016-03-18 MED ORDER — NITROGLYCERIN 0.4 MG SL SUBL: 0.4000 mg | SUBLINGUAL_TABLET | SUBLINGUAL | Status: DC | PRN

## 2016-03-18 MED ORDER — ENOXAPARIN SODIUM 40 MG/0.4ML ~~LOC~~ SOLN
40.0000 mg | SUBCUTANEOUS | Status: DC
  Administered 2016-03-18 – 2016-03-21 (×4): 40 mg via SUBCUTANEOUS
  Filled 2016-03-18 (×4): qty 0.4

## 2016-03-18 MED ORDER — SENNOSIDES-DOCUSATE SODIUM 8.6-50 MG PO TABS
1.0000 | ORAL_TABLET | Freq: Every evening | ORAL | Status: DC | PRN
  Administered 2016-03-21: 1 via ORAL
  Filled 2016-03-18: qty 1

## 2016-03-18 MED ORDER — ACETAMINOPHEN 325 MG PO TABS
650.0000 mg | ORAL_TABLET | Freq: Four times a day (QID) | ORAL | Status: DC | PRN
  Administered 2016-03-19 (×2): 650 mg via ORAL
  Filled 2016-03-18 (×2): qty 2

## 2016-03-18 MED ORDER — DEXTROSE 5 % IV SOLN
1.0000 g | INTRAVENOUS | Status: DC
  Administered 2016-03-19 – 2016-03-22 (×4): 1 g via INTRAVENOUS
  Filled 2016-03-18 (×7): qty 10

## 2016-03-18 MED ORDER — OXYCODONE-ACETAMINOPHEN 7.5-325 MG PO TABS
1.0000 | ORAL_TABLET | Freq: Four times a day (QID) | ORAL | Status: DC | PRN
  Administered 2016-03-18 – 2016-03-21 (×4): 1 via ORAL
  Filled 2016-03-18 (×4): qty 1

## 2016-03-18 MED ORDER — INSULIN ASPART 100 UNIT/ML ~~LOC~~ SOLN
0.0000 [IU] | Freq: Three times a day (TID) | SUBCUTANEOUS | Status: DC
  Administered 2016-03-18: 5 [IU] via SUBCUTANEOUS
  Administered 2016-03-19: 2 [IU] via SUBCUTANEOUS
  Administered 2016-03-19: 5 [IU] via SUBCUTANEOUS
  Administered 2016-03-19: 1 [IU] via SUBCUTANEOUS
  Administered 2016-03-20: 2 [IU] via SUBCUTANEOUS
  Administered 2016-03-20: 3 [IU] via SUBCUTANEOUS
  Administered 2016-03-20: 2 [IU] via SUBCUTANEOUS
  Administered 2016-03-21: 5 [IU] via SUBCUTANEOUS
  Administered 2016-03-21: 2 [IU] via SUBCUTANEOUS
  Administered 2016-03-21 – 2016-03-22 (×2): 3 [IU] via SUBCUTANEOUS
  Administered 2016-03-22: 5 [IU] via SUBCUTANEOUS

## 2016-03-18 MED ORDER — ACETAMINOPHEN 325 MG PO TABS
650.0000 mg | ORAL_TABLET | Freq: Once | ORAL | Status: DC
  Filled 2016-03-18: qty 2

## 2016-03-18 MED ORDER — ZOLPIDEM TARTRATE 5 MG PO TABS
5.0000 mg | ORAL_TABLET | Freq: Every evening | ORAL | Status: DC | PRN
  Administered 2016-03-20 – 2016-03-21 (×2): 5 mg via ORAL
  Filled 2016-03-18 (×2): qty 1

## 2016-03-18 MED ORDER — STROKE: EARLY STAGES OF RECOVERY BOOK
Freq: Once | Status: AC
  Administered 2016-03-19: 09:00:00
  Filled 2016-03-18: qty 1

## 2016-03-18 MED ORDER — ONDANSETRON HCL 4 MG/2ML IJ SOLN
4.0000 mg | Freq: Four times a day (QID) | INTRAMUSCULAR | Status: DC | PRN
  Administered 2016-03-18: 4 mg via INTRAVENOUS
  Filled 2016-03-18: qty 2

## 2016-03-18 MED ORDER — INSULIN ASPART PROT & ASPART (70-30 MIX) 100 UNIT/ML ~~LOC~~ SUSP
20.0000 [IU] | Freq: Three times a day (TID) | SUBCUTANEOUS | Status: DC
  Administered 2016-03-18 – 2016-03-22 (×12): 20 [IU] via SUBCUTANEOUS
  Filled 2016-03-18: qty 10

## 2016-03-18 MED ORDER — ACETAMINOPHEN 325 MG PO TABS
650.0000 mg | ORAL_TABLET | Freq: Once | ORAL | Status: AC
  Administered 2016-03-18: 650 mg via ORAL
  Filled 2016-03-18: qty 2

## 2016-03-18 NOTE — ED Notes (Signed)
EMS reports pt's husband woke up and noticed pt trying to void behind the door in her room at 0530 this morning.  Reports went to bed around 1100 last night and was normal at that time.  Reports pt has history of TIA and is diabetic.  CBG with ems was 226.  EMS says pt c/o confusion, difficulty walking, and slurred speech.   Reports pt has been under a lot of stress recently due to the death of her children.

## 2016-03-18 NOTE — ED Notes (Signed)
SOC evaluating pt at this time.

## 2016-03-18 NOTE — ED Provider Notes (Signed)
CSN: [id number]     Arrival date & time 03/18/16  0715 History   First MD Initiated Contact with Patient 03/18/16 [phone]     Chief Complaint  Patient presents with  . Code Stroke   LEVEL 5 CAVEAT DUE TO ACUITY OF CONDITION  Patient is a 80 y.o. female presenting with weakness. The history is provided by the patient and the EMS personnel. The history is limited by the condition of the patient.  Weakness This is a new problem. The current episode started 1 to 2 hours ago. The problem occurs constantly. The problem has been gradually worsening. Nothing aggravates the symptoms. Nothing relieves the symptoms.  Patient presents from home for weakness and altered mental status Per EMS, pt last known well (LKW) at 0530am After waking up, pt appeared confused and was trying to void behind door She also had difficulty speaking EMS reports she appears confused Glucose >200 No other details known at arrival   Past Medical History  Diagnosis Date  . Arteriosclerotic cardiovascular disease (ASCVD) CARDIOLOGIST-   DR Dietrich Pates     CABG surgery in 2001; nl stress nuclear-2007; angiography in 01/2006- TO of LAD; 70% ostial stenosis      of a small ramus; patent grafts; normal EF; cath in 12/2009 essentially unchanged; EF-66%;  Congestive heart failure with preserved LV systolic function  . Hypertension   . Varicose veins   . Hypothyroidism   . Insulin dependent type 2 diabetes mellitus (HCC)   . Peripheral neuropathy (HCC)   . Diastolic CHF (HCC)   . Hyperlipidemia   . History of CVA (cerebrovascular accident)     2013--  LACUNAR INFARCTIONS RIGHT THALAMUS---  residual left side of  lip numb  and left eye vision worse (which corrented lens implant from cataract extraction)  . Lesion of bladder   . Hematuria   . Arthritis   . DDD (degenerative disc disease), lumbar   . OSA (obstructive sleep apnea)   . Chronic obstructive pulmonary disease (HCC)     Chronic bronchitis  . History of MI (myocardial  infarction)     07/ 2001   s/p  cabg x2  . S/P CABG x 2     07-02-2000  . SUI (stress urinary incontinence, female)   . Lower urinary tract symptoms (LUTS)   . Psoriasis     lower leg  . Thinning of skin    Past Surgical History  Procedure Laterality Date  . Rotator cuff repair Right 1995  . Lumbar spine surgery  x2    yrs ago  . Carpal tunnel release Bilateral yrs ago  . Orif humerus fracture Right 04-18-2008  . Knee arthroscopy Left 10-18-2003    SYNOVECTOMY  . Repair flexor tendon hand Right 09-20-2008    CARPI RADIALIS TENDON TRANSFER TO EXTENSOR TO INDEX, MIDDLE, RING , LITTLE FINGERS AND DIGITI MININI  . Cardiovascular stress test  08-28-2011   DR ROTHBART    NEGATIVE NUCLEAR STUDY/  GLOBAL LVSF/  EF 65%  . Cholecystectomy  1990  . Appendectomy  age 78  . Total abdominal hysterectomy w/ bilateral salpingoophorectomy  age 75  . Cataract extraction w/ intraocular lens  implant, bilateral  2013  . Cardiac catheterization  1991    No sig. cad  . Coronary angioplasty  11-13-1999    balloon angioplasty to mLAD , d2 of LAD  . Coronary angioplasty with stent placement  01-17-2000    PCI stenting to mLAD & D2 of LAD  .  Cardiac catheterization  07-01-2000    high grade in-stent restenosis  . Cardiac catheterization  04-30-2001    single native vessel cad/ graft patent  . Cardiac catheterization  02-13-2006  dr Juanda Chance    2V cad with total occluded LAD  and  70% ostial of small ramus/  grafts patent/  normal ef  . Cardiac catheterization  01-20-2000  dr Clifton James    essentially unchanged;  severe single vessel cad with diffuse disease throughout CFX and RCA/ ef 66%;  chf with preserved LVSF  . Coronary artery bypass graft  07-02-2000   DR Tressie Stalker    SVG to diagonal, LIMA to LAD  . Transthoracic echocardiogram  12-12-2013    MILD LVH/  EF 60-65%/  GRADE I DIASTOLIC DYSFUNCTION/  MILD LAE  . Cystoscopy w/ retrogrades Bilateral 08/09/2014    Procedure: CYSTOSCOPY, BILATERAL  RETROGRADE, HYDRODISTENSION, BLADDER BIOPSY WITH FULGERATION, INSTILL PYRIDIUM AND MARCAINE ;  Surgeon: Anner Crete, MD;  Location: [facility];  Service: Urology;  Laterality: Bilateral;  . Colonoscopy N/A 07/26/2015    Procedure: COLONOSCOPY;  Surgeon: Malissa Hippo, MD;  Location: [facility];  Service: Endoscopy;  Laterality: N/A;  1200   Family History  Problem Relation Age of Onset  . Coronary artery disease      Multiple first and second-degree relatives  . Aneurysm      Cerebral circulation  . Hypertension Mother   . Colon cancer Sister   . Clotting disorder      Children diagnosed with hypercoagulable state   Social History  Substance Use Topics  . Smoking status: Never Smoker   . Smokeless tobacco: Never Used  . Alcohol Use: No   OB History    Gravida Para Term Preterm AB TAB SAB Ectopic Multiple Living   7 7 7       4      Review of Systems  Unable to perform ROS: Acuity of condition  Neurological: Positive for weakness.      Allergies  Codeine  Home Medications   Prior to Admission medications   Medication Sig Start Date End Date Taking? Authorizing Provider  albuterol (PROVENTIL) (2.5 MG/3ML) 0.083% nebulizer solution Take 3 mLs (2.5 mg total) by nebulization every 6 (six) hours as needed for wheezing or shortness of breath. 09/11/14   Eber Hong, MD  aspirin EC 325 MG tablet Take 325 mg by mouth daily.    Historical Provider, MD  diazepam (VALIUM) 5 MG tablet Take 5 mg by mouth every 6 (six) hours as needed for anxiety.    Historical Provider, MD  Insulin Degludec (TRESIBA FLEXTOUCH) 200 UNIT/ML SOPN Inject 35-70 Units into the skin 2 (two) times daily. 35 units in the morning and 70 units in the evening    Historical Provider, MD  insulin lispro (HUMALOG) 100 UNIT/ML injection Inject 20 Units into the skin 3 (three) times daily. Or as directed per sliding scale    Historical Provider, MD  levothyroxine (SYNTHROID, LEVOTHROID) 25 MCG tablet  Take 25 mcg by mouth every morning.     Historical Provider, MD  lisinopril (PRINIVIL,ZESTRIL) 10 MG tablet Take 1 tablet by mouth every morning.  11/09/13   Historical Provider, MD  metFORMIN (GLUCOPHAGE) 500 MG tablet Take 500 mg by mouth 2 (two) times daily with a meal.      Historical Provider, MD  minocycline (MINOCIN,DYNACIN) 100 MG capsule Take 1 capsule (100 mg total) by mouth 2 (two) times daily. 11/22/15  Oval Linsey, MD  nitroGLYCERIN (NITROSTAT) 0.4 MG SL tablet Place 0.4 mg under the tongue every 5 (five) minutes as needed for chest pain.     Historical Provider, MD  pravastatin (PRAVACHOL) 20 MG tablet Take 20 mg by mouth every evening.     Historical Provider, MD  zolpidem (AMBIEN) 5 MG tablet Take 5 mg by mouth at bedtime as needed for sleep.  08/12/14   Historical Provider, MD   BP 160/101 mmHg  Pulse 85  Temp(Src) 98 F (36.7 C) (Oral)  Resp 18  Wt 89.812 kg  SpO2 95% Physical Exam CONSTITUTIONAL: Well developed/well nourished HEAD: Normocephalic/atraumatic EYES: EOMI/PERRL, no nystagmus,no ptosis ENMT: Mucous membranes moist NECK: supple no meningeal signs CV: S1/S2 noted, no murmurs/rubs/gallops noted LUNGS: Lungs are clear to auscultation bilaterally, no apparent distress ABDOMEN: soft, nontender, no rebound or guarding NEURO:Awake/alert.  ?left facial droop.  No arm drift is noted.  Left LE drift is noted.  No right drift is noted.  Pt appears confused and is unable to fully interact with other portions of the exam.  She appears dysarthric EXTREMITIES: pulses normal, full ROM SKIN: warm, color normal PSYCH: flat affect  ED Course  Procedures   0715am: Patient seen on arrival, she was awake/alert, but had clear dysarthria Code stroke initiated Pt off to CT imaging 7:26 AM Pt back from Ct imaging Awaiting neuro SOC evaluation 0739am D/w on call neurologist who will evaluate patient 7:50 AM D/w dr Donnie Coffin with neuro SOC He cancels code stroke tPA in  stroke considered but not given due to: Onset over 3-4.5hours and no focal findings (now reported pt LKW was at 2330) 9:41 AM uti noted She is still confused, but she is awake/alert D/w husband about plan D/w dr Ardyth Harps with [facility], will admit   Labs Review Labs Reviewed  CBC - Abnormal; Notable for the following:    RBC 5.36 (*)    Hemoglobin 10.6 (*)    MCV 69.2 (*)    MCH 19.8 (*)    MCHC 28.6 (*)    RDW 18.8 (*)    All other components within normal limits  DIFFERENTIAL - Abnormal; Notable for the following:    Monocytes Absolute 1.1 (*)    All other components within normal limits  COMPREHENSIVE METABOLIC PANEL - Abnormal; Notable for the following:    Glucose, Bld 214 (*)    All other components within normal limits  URINE RAPID DRUG SCREEN, HOSP PERFORMED - Abnormal; Notable for the following:    Benzodiazepines POSITIVE (*)    All other components within normal limits  URINALYSIS, ROUTINE W REFLEX MICROSCOPIC (NOT AT [facility]) - Abnormal; Notable for the following:    APPearance CLOUDY (*)    Glucose, UA >1000 (*)    Nitrite POSITIVE (*)    Leukocytes, UA MODERATE (*)    All other components within normal limits  BLOOD GAS, ARTERIAL - Abnormal; Notable for the following:    pO2, Arterial 70.1 (*)    Bicarbonate 26.5 (*)    Acid-Base Excess 2.6 (*)    All other components within normal limits  URINE MICROSCOPIC-ADD ON - Abnormal; Notable for the following:    Squamous Epithelial / LPF 0-5 (*)    Bacteria, UA MANY (*)    All other components within normal limits  I-STAT CHEM 8, ED - Abnormal; Notable for the following:    Glucose, Bld 206 (*)    All other components within normal limits  URINE CULTURE  ETHANOL  PROTIME-INR  APTT  TROPONIN I  I-STAT TROPOININ, ED    Imaging Review Ct Head Wo Contrast  03/18/2016  CLINICAL DATA:  Altered mental status EXAM: CT HEAD WITHOUT CONTRAST TECHNIQUE: Contiguous axial images were obtained from the base of  the skull through the vertex without intravenous contrast. COMPARISON:  December 19, 2015 FINDINGS: Mild diffuse atrophy is stable. There is no intracranial mass, hemorrhage, extra-axial fluid collection, or midline shift. There is patchy small vessel disease throughout the centra semiovale bilaterally, stable. There is no new gray-white compartment lesion. No acute infarct evident. The middle cerebral artery attenuation is symmetric and unchanged bilaterally. Bony calvarium appears intact. Mastoids on the right are clear. There is persistent opacification of several mastoid air cells on the left. There is mucosal thickening in several ethmoid air cells bilaterally. No intraorbital lesions are evident. IMPRESSION: Stable atrophy with patchy periventricular small vessel disease. No acute infarct evident. No new gray-white compartment lesion. No hemorrhage or mass effect. Stable left-sided mastoid disease as well as bilateral ethmoid sinus disease present. Critical Value/emergent results were called by telephone at the time of interpretation on 03/18/2016 at 7:44 am to Dr. Zadie RhineNALD Lygia Olaes , who verbally acknowledged these results. Electronically Signed   By: Bretta BangWilliam  Woodruff III M.D.   On: 03/18/2016 07:44   I have personally reviewed and evaluated these images and lab results as part of my medical decision-making.   EKG Interpretation   Date/Time:  Monday March 18 2016 07:27:15 EDT Ventricular Rate:  80 PR Interval:  212 QRS Duration: 100 QT Interval:  388 QTC Calculation: 448 R Axis:   -58 Text Interpretation:  Sinus or ectopic atrial rhythm Borderline prolonged  PR interval Left anterior fascicular block LVH with secondary  repolarization abnormality Anterior infarct, old No significant change  since last tracing Confirmed by Bebe ShaggyWICKLINE  MD, Jasmeet Manton ([id number]) on 03/18/2016  7:29:27 AM     Medications  cefTRIAXone (ROCEPHIN) 1 g in dextrose 5 % 50 mL IVPB (1 g Intravenous New Bag/Given 03/18/16 0909)   acetaminophen (TYLENOL) tablet 650 mg (650 mg Oral Given 03/18/16 [id number])    MDM   Final diagnoses:  UTI (lower urinary tract infection)  Delirium    Nursing notes including past medical history and social history reviewed and considered in documentation xrays/imaging reviewed by myself and considered during evaluation Labs/vital reviewed myself and considered during evaluation;    Zadie Rhineonald Morgen Linebaugh, MD 03/18/16 [phone]

## 2016-03-18 NOTE — Progress Notes (Signed)
[id number] Code Stroke telephone 0714 Beeper 0718 Exam started 0719 [facility]am done, [facility] [phone] Completed in EPIC

## 2016-03-18 NOTE — ED Notes (Signed)
MD at bedside. 

## 2016-03-18 NOTE — ED Notes (Signed)
Dr. Bebe ShaggyWickline present at bedside with EMS arrival and did assessment to call code Stroke.

## 2016-03-18 NOTE — H&P (Signed)
[facility]          History and Physical    PCP:   Maricela Curet, MD   EDP: Julious Oka, M.D.  Chief Complaint:  Dysarthria, confusion  HPI: Patient is a 80 year old woman with multiple medical comorbidities including obesity, coronary artery disease, hypertension, hyperlipidemia, insulin-dependent diabetes mellitus, hypothyroidism who presented to the emergency department this morning with the above-mentioned complaints. She was last seen normal at about 5:30 AM today, shortly after waking up she was noted to be confused and dysarthric. Code stroke was activated, after further questioning it was determined that her last seen normal was last night, because of this code stroke was cancel and TPA was not administered. CT scan of the head is negative for acute findings, admission has been requested for further investigations. She is also found to have a UTI.  Allergies:   Allergies  Allergen Reactions  . Codeine Itching      Past Medical History  Diagnosis Date  . Arteriosclerotic cardiovascular disease (ASCVD) CARDIOLOGIST-   DR Lattie Haw     CABG surgery in 2001; nl stress nuclear-2007; angiography in 01/2006- TO of LAD; 70% ostial stenosis      of a small ramus; patent grafts; normal EF; cath in 12/2009 essentially unchanged; EF-66%;  Congestive heart failure with preserved LV systolic function  . Hypertension   . Varicose veins   . Hypothyroidism   . Insulin dependent type 2 diabetes mellitus ([place])   . Peripheral neuropathy ([place])   . Diastolic CHF ([place])   . Hyperlipidemia   . History of CVA (cerebrovascular accident)     2013--  LACUNAR INFARCTIONS RIGHT THALAMUS---  residual left side of  lip numb  and left eye vision worse (which corrented lens implant from cataract extraction)  . Lesion of bladder   . Hematuria   . Arthritis   . DDD (degenerative disc disease), lumbar   . OSA (obstructive sleep apnea)   . Chronic obstructive pulmonary  disease (HCC)     Chronic bronchitis  . History of MI (myocardial infarction)     07/ 2001   s/p  cabg x2  . S/P CABG x 2     07-02-2000  . SUI (stress urinary incontinence, female)   . Lower urinary tract symptoms (LUTS)   . Psoriasis     lower leg  . Thinning of skin     Past Surgical History  Procedure Laterality Date  . Rotator cuff repair Right 1995  . Lumbar spine surgery  x2    yrs ago  . Carpal tunnel release Bilateral yrs ago  . Orif humerus fracture Right 04-18-2008  . Knee arthroscopy Left 10-18-2003    SYNOVECTOMY  . Repair flexor tendon hand Right 09-20-2008    CARPI RADIALIS TENDON TRANSFER TO EXTENSOR TO INDEX, MIDDLE, RING , LITTLE FINGERS AND DIGITI MININI  . Cardiovascular stress test  08-28-2011   DR ROTHBART    NEGATIVE NUCLEAR STUDY/  GLOBAL LVSF/  EF 65%  . Cholecystectomy  1990  . Appendectomy  age 69  . Total abdominal hysterectomy w/ bilateral salpingoophorectomy  age 74  . Cataract extraction w/ intraocular lens  implant, bilateral  2013  . Cardiac catheterization  1991    No sig. cad  . Coronary angioplasty  11-13-1999    balloon angioplasty to mLAD , d2 of LAD  . Coronary angioplasty with stent placement  01-17-2000  PCI stenting to mLAD & D2 of LAD  . Cardiac catheterization  07-01-2000    high grade in-stent restenosis  . Cardiac catheterization  04-30-2001    single native vessel cad/ graft patent  . Cardiac catheterization  02-13-2006  dr Olevia Perches    2V cad with total occluded LAD  and  70% ostial of small ramus/  grafts patent/  normal ef  . Cardiac catheterization  01-20-2000  dr Angelena Form    essentially unchanged;  severe single vessel cad with diffuse disease throughout CFX and RCA/ ef 66%;  chf with preserved LVSF  . Coronary artery bypass graft  07-02-2000   DR Darylene Price    SVG to diagonal, LIMA to LAD  . Transthoracic echocardiogram  12-12-2013    MILD LVH/  EF 85-63%/  GRADE I DIASTOLIC DYSFUNCTION/  MILD LAE  . Cystoscopy w/  retrogrades Bilateral 08/09/2014    Procedure: CYSTOSCOPY, BILATERAL RETROGRADE, HYDRODISTENSION, BLADDER BIOPSY WITH FULGERATION, INSTILL PYRIDIUM AND MARCAINE ;  Surgeon: Malka So, MD;  Location: [facility];  Service: Urology;  Laterality: Bilateral;  . Colonoscopy N/A 07/26/2015    Procedure: COLONOSCOPY;  Surgeon: Rogene Houston, MD;  [facility];  Service: Endoscopy;  Laterality: N/A;  1200    Prior to Admission medications   Medication Sig Start Date End Date Taking? Authorizing Provider  aspirin EC 325 MG tablet Take 325 mg by mouth daily.   Yes Historical Provider, MD  Insulin Degludec (TRESIBA FLEXTOUCH) 200 UNIT/ML SOPN Inject 35-70 Units into the skin 2 (two) times daily. 35 units in the morning and 70 units in the evening   Yes Historical Provider, MD  levothyroxine (SYNTHROID, LEVOTHROID) 25 MCG tablet Take 25 mcg by mouth every morning.    Yes Historical Provider, MD  lisinopril (PRINIVIL,ZESTRIL) 10 MG tablet Take 1 tablet by mouth every morning.  11/09/13  Yes Historical Provider, MD  metFORMIN (GLUCOPHAGE) 500 MG tablet Take 500 mg by mouth 2 (two) times daily with a meal.     Yes Historical Provider, MD  nitroGLYCERIN (NITROSTAT) 0.4 MG SL tablet Place 0.4 mg under the tongue every 5 (five) minutes as needed for chest pain.    Yes Historical Provider, MD  NOVOLIN 70/30 RELION (70-30) 100 UNIT/ML injection Inject 20 Units into the skin 3 (three) times daily before meals. 02/25/16  Yes Historical Provider, MD  oxyCODONE-acetaminophen (PERCOCET) 7.5-325 MG tablet Take 1 tablet by mouth 4 (four) times daily as needed for moderate pain.  02/29/16  Yes Historical Provider, MD  pravastatin (PRAVACHOL) 20 MG tablet Take 20 mg by mouth every evening.    Yes Historical Provider, MD  zolpidem (AMBIEN) 5 MG tablet Take 5 mg by mouth at bedtime as needed for sleep.  08/12/14  Yes Historical Provider, MD  albuterol (PROVENTIL) (2.5 MG/3ML) 0.083% nebulizer solution Take  3 mLs (2.5 mg total) by nebulization every 6 (six) hours as needed for wheezing or shortness of breath. 09/11/14   Noemi Chapel, MD  diazepam (VALIUM) 5 MG tablet Take 5 mg by mouth every 6 (six) hours as needed for anxiety.    Historical Provider, MD  minocycline (MINOCIN,DYNACIN) 100 MG capsule Take 1 capsule (100 mg total) by mouth 2 (two) times daily. Patient not taking: Reported on 03/18/2016 11/22/15   Lucia Gaskins, MD    Social History:  reports that she has never smoked. She has never used smokeless tobacco. She reports that she does not drink alcohol or use illicit  drugs.  Family History  Problem Relation Age of Onset  . Coronary artery disease      Multiple first and second-degree relatives  . Aneurysm      Cerebral circulation  . Hypertension Mother   . Colon cancer Sister   . Clotting disorder      Children diagnosed with hypercoagulable state    Review of Systems:  Constitutional: Denies fever, chills, diaphoresis, appetite change and fatigue.  HEENT: Denies photophobia, eye pain, redness, hearing loss, ear pain, congestion, sore throat, rhinorrhea, sneezing, mouth sores, trouble swallowing, neck pain, neck stiffness and tinnitus.   Respiratory: Denies SOB, DOE, cough, chest tightness,  and wheezing.   Cardiovascular: Denies chest pain, palpitations and leg swelling.  Gastrointestinal: Denies nausea, vomiting, abdominal pain, diarrhea, constipation, blood in stool and abdominal distention.  Genitourinary: Denies urgency, frequency, hematuria, flank pain and difficulty urinating.  Endocrine: Denies: hot or cold intolerance, sweats, changes in hair or nails, polyuria, polydipsia. Musculoskeletal: Denies myalgias, back pain, joint swelling, arthralgias and gait problem.  Skin: Denies pallor, rash and wound.  Neurological: Denies dizziness, seizures, syncope, weakness, light-headedness, numbness and headaches.  Hematological: Denies adenopathy. Easy bruising, personal or  family bleeding history  Psychiatric/Behavioral: Denies suicidal ideation, mood changes, confusion, nervousness, sleep disturbance and agitation   Physical Exam: Blood pressure 163/74, pulse 82, temperature 98.2 F (36.8 C), temperature source Oral, resp. rate 20, height _0  (1.626 m), weight 93.26 kg (205 lb 9.6 oz), SpO2 99 %. General: Alert, awake, oriented 3 HEENT: Normocephalic, atraumatic, pupils equal and reactive to light, extraocular movements intact, appears to have a right-sided facial droop + neck: Supple, no JVD, no lymphadenopathy, no bruits, no goiter is an cardiovascular: Regular rate and rhythm, no murmurs, rubs or gallops Lungs: Clear auscultation bilaterally and excellent abdomen: Soft, nontender, nondistended, positive bowel sounds Extremities: No clubbing, cyanosis or edema, positive pulses Neurologic: Mental status intact appears to have a slight right facial droop, her speech is slurred and dysarthric, DTRs 2+ symmetric, sensation intact to light touch, muscle strength 5 over 5 bilateral and symmetric, Babinski downgoing bilaterally  Labs on Admission:  Results for orders placed or performed during the hospital encounter of 03/18/16 (from the past 48 hour(s))  Ethanol     Status: None   Collection Time: 03/18/16  7:15 AM  Result Value Ref Range   Alcohol, Ethyl (B) <5 <5 mg/dL    Comment:        LOWEST DETECTABLE LIMIT FOR SERUM ALCOHOL IS 5 mg/dL FOR MEDICAL PURPOSES ONLY   Protime-INR     Status: None   Collection Time: 03/18/16  7:15 AM  Result Value Ref Range   Prothrombin Time 14.3 11.6 - 15.2 seconds   INR 1.09 0.00 - 1.49  APTT     Status: None   Collection Time: 03/18/16  7:15 AM  Result Value Ref Range   aPTT 28 24 - 37 seconds  CBC     Status: Abnormal   Collection Time: 03/18/16  7:15 AM  Result Value Ref Range   WBC 9.1 4.0 - 10.5 K/uL   RBC 5.36 (H) 3.87 - 5.11 MIL/uL   Hemoglobin 10.6 (L) 12.0 - 15.0 g/dL   HCT 37.1 36.0 - 46.0 %   MCV  69.2 (L) 78.0 - 100.0 fL   MCH 19.8 (L) 26.0 - 34.0 pg   MCHC 28.6 (L) 30.0 - 36.0 g/dL   RDW 18.8 (H) 11.5 - 15.5 %   Platelets 283 150 - 400  K/uL  Differential     Status: Abnormal   Collection Time: 03/18/16  7:15 AM  Result Value Ref Range   Neutrophils Relative % 41 %   Neutro Abs 3.8 1.7 - 7.7 K/uL   Lymphocytes Relative 41 %   Lymphs Abs 3.7 0.7 - 4.0 K/uL   Monocytes Relative 12 %   Monocytes Absolute 1.1 (H) 0.1 - 1.0 K/uL   Eosinophils Relative 5 %   Eosinophils Absolute 0.4 0.0 - 0.7 K/uL   Basophils Relative 1 %   Basophils Absolute 0.1 0.0 - 0.1 K/uL  Comprehensive metabolic panel     Status: Abnormal   Collection Time: 03/18/16  7:15 AM  Result Value Ref Range   Sodium 139 135 - 145 mmol/L   Potassium 4.2 3.5 - 5.1 mmol/L   Chloride 104 101 - 111 mmol/L   CO2 27 22 - 32 mmol/L   Glucose, Bld 214 (H) 65 - 99 mg/dL   BUN 8 6 - 20 mg/dL   Creatinine, Ser 0.68 0.44 - 1.00 mg/dL   Calcium 8.9 8.9 - 10.3 mg/dL   Total Protein 7.0 6.5 - 8.1 g/dL   Albumin 3.8 3.5 - 5.0 g/dL   AST 19 15 - 41 U/L   ALT 14 14 - 54 U/L   Alkaline Phosphatase 84 38 - 126 U/L   Total Bilirubin 0.3 0.3 - 1.2 mg/dL   GFR calc non Af Amer >60 >60 mL/min   GFR calc Af Amer >60 >60 mL/min    Comment: (NOTE) The eGFR has been calculated using the CKD EPI equation. This calculation has not been validated in all clinical situations. eGFR's persistently <60 mL/min signify possible Chronic Kidney Disease.    Anion gap 8 5 - 15  Troponin I     Status: None   Collection Time: 03/18/16  7:15 AM  Result Value Ref Range   Troponin I <0.03 <0.031 ng/mL    Comment:        NO INDICATION OF MYOCARDIAL INJURY.   I-stat troponin, ED (not at [facility], [facility])     Status: None   Collection Time: 03/18/16  7:20 AM  Result Value Ref Range   Troponin i, poc 0.00 0.00 - 0.08 ng/mL   Comment 3            Comment: Due to the release kinetics of cTnI, a negative result within the first hours of the onset of  symptoms does not rule out myocardial infarction with certainty. If myocardial infarction is still suspected, repeat the test at appropriate intervals.   I-Stat Chem 8, ED  (not at [facility], [facility])     Status: Abnormal   Collection Time: 03/18/16  7:22 AM  Result Value Ref Range   Sodium 141 135 - 145 mmol/L   Potassium 4.2 3.5 - 5.1 mmol/L   Chloride 101 101 - 111 mmol/L   BUN 6 6 - 20 mg/dL   Creatinine, Ser 0.60 0.44 - 1.00 mg/dL   Glucose, Bld 206 (H) 65 - 99 mg/dL   Calcium, Ion 1.18 1.13 - 1.30 mmol/L   TCO2 27 0 - 100 mmol/L   Hemoglobin 13.3 12.0 - 15.0 g/dL   HCT 39.0 36.0 - 46.0 %  Blood gas, arterial     Status: Abnormal   Collection Time: 03/18/16  8:10 AM  Result Value Ref Range   FIO2 21.00    pH, Arterial 7.400 7.350 - 7.450   pCO2 arterial 44.6 35.0 - 45.0 mmHg  pO2, Arterial 70.1 (L) 80.0 - 100.0 mmHg   Bicarbonate 26.5 (H) 20.0 - 24.0 mEq/L   Acid-Base Excess 2.6 (H) 0.0 - 2.0 mmol/L   O2 Saturation 92.7 %   Collection site RIGHT BRACHIAL    Drawn by [phone]    Sample type ARTERIAL    Allens test (pass/fail) PASS PASS  Urine rapid drug screen (hosp performed)not at [facility]     Status: Abnormal   Collection Time: 03/18/16  8:32 AM  Result Value Ref Range   Opiates NONE DETECTED NONE DETECTED   Cocaine NONE DETECTED NONE DETECTED   Benzodiazepines POSITIVE (A) NONE DETECTED   Amphetamines NONE DETECTED NONE DETECTED   Tetrahydrocannabinol NONE DETECTED NONE DETECTED   Barbiturates NONE DETECTED NONE DETECTED    Comment:        DRUG SCREEN FOR MEDICAL PURPOSES ONLY.  IF CONFIRMATION IS NEEDED FOR ANY PURPOSE, NOTIFY LAB WITHIN 5 DAYS.        LOWEST DETECTABLE LIMITS FOR URINE DRUG SCREEN Drug Class       Cutoff (ng/mL) Amphetamine      1000 Barbiturate      200 Benzodiazepine   440 Tricyclics       347 Opiates          300 Cocaine          300 THC              50   Urinalysis, Routine w reflex microscopic (not at [facility])     Status: Abnormal   Collection  Time: 03/18/16  8:32 AM  Result Value Ref Range   Color, Urine YELLOW YELLOW   APPearance CLOUDY (A) CLEAR   Specific Gravity, Urine 1.010 1.005 - 1.030   pH 6.0 5.0 - 8.0   Glucose, UA >1000 (A) NEGATIVE mg/dL   Hgb urine dipstick NEGATIVE NEGATIVE   Bilirubin Urine NEGATIVE NEGATIVE   Ketones, ur NEGATIVE NEGATIVE mg/dL   Protein, ur NEGATIVE NEGATIVE mg/dL   Nitrite POSITIVE (A) NEGATIVE   Leukocytes, UA MODERATE (A) NEGATIVE  Urine microscopic-add on     Status: Abnormal   Collection Time: 03/18/16  8:32 AM  Result Value Ref Range   Squamous Epithelial / LPF 0-5 (A) NONE SEEN   WBC, UA TOO NUMEROUS TO COUNT 0 - 5 WBC/hpf   RBC / HPF NONE SEEN 0 - 5 RBC/hpf   Bacteria, UA MANY (A) NONE SEEN    Radiological Exams on Admission: Ct Head Wo Contrast  03/18/2016  CLINICAL DATA:  Altered mental status EXAM: CT HEAD WITHOUT CONTRAST TECHNIQUE: Contiguous axial images were obtained from the base of the skull through the vertex without intravenous contrast. COMPARISON:  December 19, 2015 FINDINGS: Mild diffuse atrophy is stable. There is no intracranial mass, hemorrhage, extra-axial fluid collection, or midline shift. There is patchy small vessel disease throughout the centra semiovale bilaterally, stable. There is no new gray-white compartment lesion. No acute infarct evident. The middle cerebral artery attenuation is symmetric and unchanged bilaterally. Bony calvarium appears intact. Mastoids on the right are clear. There is persistent opacification of several mastoid air cells on the left. There is mucosal thickening in several ethmoid air cells bilaterally. No intraorbital lesions are evident. IMPRESSION: Stable atrophy with patchy periventricular small vessel disease. No acute infarct evident. No new gray-white compartment lesion. No hemorrhage or mass effect. Stable left-sided mastoid disease as well as bilateral ethmoid sinus disease present. Critical Value/emergent results were called by  telephone at the time of interpretation  on 03/18/2016 at 7:44 am to Dr. Ripley Fraise , who verbally acknowledged these results. Electronically Signed   By: Lowella Grip III M.D.   On: 03/18/2016 07:44   Dg Chest Portable 1 View  03/18/2016  CLINICAL DATA:  Confusion and slurred speech EXAM: PORTABLE CHEST 1 VIEW COMPARISON:  November 19, 2015 FINDINGS: There is no edema or consolidation. Heart is upper normal in size with pulmonary vascularity within normal limits. Patient is status post coronary artery bypass grafting. No adenopathy. No bone lesions evident. There is postoperative change in the right proximal humeral region. IMPRESSION: No edema or consolidation. Electronically Signed   By: Lowella Grip III M.D.   On: 03/18/2016 08:13    Assessment/Plan Active Problems:   Dysarthria   Hypothyroidism   DM type 2 causing complication (HCC)   Hyperlipidemia   Obesity   Essential hypertension   UTI (lower urinary tract infection)    Confusion/dysarthria/right facial droop -Her dysarthria and facial droop remain although her confusion seems to have resolved. -Agree with MRI to fully rule out possibility of acute CVA. -We'll also do 2-D echo and carotid Dopplers.  -request PT and OT evaluation. -We'll augment aspirin to Plavix.  UTI -Continue Rocephin pending culture data. -This could be contributing to her delirium as well.  Hypothyroidism  -check TSH, continue home dose of Synthroid  Insulin-dependent diabetes mellitus -Continue home dose of 7030 with sensitive sliding scale added.  Morbid obesity -Noted  Hypertension -Well-controlled, continue home medications.  DVT prophylaxis -Lovenox  CODE STATUS -Full Code   Time Spent on Admission: 75 minutes  HERNANDEZ ACOSTA,ESTELA [facility] Pager: [phone] 03/18/2016, 5:42 PM

## 2016-03-18 NOTE — ED Notes (Signed)
PT to CT at this time with nurse Leotis ShamesLauren, RN accompanying.

## 2016-03-18 NOTE — ED Notes (Addendum)
RT at bedside.   Code stroke cancelled. Last known well time 1100 last night when pt went to bed. Pt symptoms noted at 0530 when she got up. Husband at bedside to validate this.

## 2016-03-19 ENCOUNTER — Inpatient Hospital Stay (HOSPITAL_COMMUNITY): Payer: Medicare HMO

## 2016-03-19 DIAGNOSIS — I6350 Cerebral infarction due to unspecified occlusion or stenosis of unspecified cerebral artery: Secondary | ICD-10-CM

## 2016-03-19 LAB — ECHOCARDIOGRAM COMPLETE
HEIGHTINCHES: 64 in
WEIGHTICAEL: 3270.4 [oz_av]

## 2016-03-19 LAB — GLUCOSE, CAPILLARY
GLUCOSE-CAPILLARY: 170 mg/dL — AB (ref 65–99)
GLUCOSE-CAPILLARY: 134 mg/dL — AB (ref 65–99)
GLUCOSE-CAPILLARY: 159 mg/dL — AB (ref 65–99)
Glucose-Capillary: 255 mg/dL — ABNORMAL HIGH (ref 65–99)

## 2016-03-19 LAB — LIPID PANEL
CHOL/HDL RATIO: 7 ratio
Cholesterol: 204 mg/dL — ABNORMAL HIGH (ref 0–200)
HDL: 29 mg/dL — ABNORMAL LOW (ref >40)
LDL Cholesterol: UNDETERMINED mg/dL (ref 0–99)
Triglycerides: 472 mg/dL — ABNORMAL HIGH (ref <150)
VLDL: UNDETERMINED mg/dL (ref 0–40)

## 2016-03-19 LAB — HEMOGLOBIN A1C
Hgb A1c MFr Bld: 9.5 % — ABNORMAL HIGH (ref 4.8–5.6)
MEAN PLASMA GLUCOSE: 226 mg/dL

## 2016-03-19 MED ORDER — LORAZEPAM 0.5 MG PO TABS
0.5000 mg | ORAL_TABLET | Freq: Four times a day (QID) | ORAL | Status: DC | PRN
Start: 2016-03-19 — End: 2016-03-22
  Administered 2016-03-19: 0.5 mg via ORAL
  Filled 2016-03-19: qty 1

## 2016-03-19 MED ORDER — HYDROCERIN EX CREA
TOPICAL_CREAM | Freq: Every day | CUTANEOUS | Status: DC
  Administered 2016-03-19 – 2016-03-22 (×4): 1 via TOPICAL
  Filled 2016-03-19: qty 113

## 2016-03-19 MED ORDER — DIPHENHYDRAMINE HCL 25 MG PO CAPS
25.0000 mg | ORAL_CAPSULE | Freq: Once | ORAL | Status: AC
  Administered 2016-03-19: 25 mg via ORAL
  Filled 2016-03-19: qty 1

## 2016-03-19 MED ORDER — PERFLUTREN LIPID MICROSPHERE
1.0000 mL | INTRAVENOUS | Status: AC | PRN
  Administered 2016-03-19: 3 mL via INTRAVENOUS

## 2016-03-19 NOTE — Evaluation (Signed)
Physical Therapy Evaluation Patient Details Name: [name] [name] MRN: [id number] DOB: August 28, 1936 Today's Date: 03/19/2016   History of Present Illness  Patient is a 80 year old woman with multiple medical comorbidities including obesity, coronary artery disease, hypertension, hyperlipidemia, insulin-dependent diabetes mellitus, hypothyroidism who presented to the emergency department this morning with the above-mentioned complaints. Shortly after waking up she was noted to be confused and dysarthric. Code stroke was activated, after further questioning it was determined that her last seen normal was last night, because of this code stroke was cancel and TPA was not administered. CT scan of the head is negative for acute findings, admission has been requested for further investigations. She is also found to have a UTI.  Clinical Impression  Pt was found in her bed with spouse in the room. She was willing to participate in therapy, A&Ox3 however very lethargic. She reported LBP as well as headache which were monitored throughout the session. Her BP was checked prior to the start of session and found to be 135/49 in semi-recumbent position. Sensation was grossly intact throughout BLE and grip strength was poor with asymmetry noted between L and R. Pt with decreased alertness throughout session which seemed to interfere with MMT and coordination testing. Coordination of UE and LE was slow and inconsistent with pt closing her eyes several times throughout. Pt requires ModA for moving supine to/from sit, however she was able to sit EOB for ~56min with SBA to MinA for correcting posterior lean. Pt attempting to stand for several trials with therapist providing MaxA, however she had increased difficulty following commands for UE/LE placement. She also demonstrates increased posterior wt shift limiting her ability to come to stand. Pt would benefit from skilled PT services while here at [facility] to address her  limitations. At this time, it is unclear what AD she needs for ambulation, so therapist will continue to monitor the situation.    Follow Up Recommendations SNF    Equipment Recommendations  Other (comment) (will continue to assess need for equipment)    Recommendations for Other Services       Precautions / Restrictions        Mobility  Bed Mobility Overal bed mobility: Needs Assistance Bed Mobility: Supine to Sit;Sit to Supine     Supine to sit: Mod assist Sit to supine: Mod assist;HOB elevated (with LE onto bed)      Transfers Overall transfer level: Needs assistance Equipment used: Rolling walker (2 wheeled) Transfers: Sit to/from Stand Sit to Stand: Max assist (Unable to )         General transfer comment: Pt unable to stand after several attempts; noting increased posterior wt shift during activity.   Ambulation/Gait                Stairs            Wheelchair Mobility    Modified Rankin (Stroke Patients Only)       Balance Overall balance assessment: Needs assistance Sitting-balance support: Bilateral upper extremity supported Sitting balance-Leahy Scale: Fair Sitting balance - Comments: Posterior lean                                     Pertinent Vitals/Pain Pain Assessment: 0-10 Pain Score: 6  (also reporting LBP 8/10 VAS) Pain Location: head Pain Descriptors / Indicators: Aching Pain Intervention(s): Monitored during session    Home Living Family/patient expects to be discharged  to:: Private residence Living Arrangements: Spouse/significant other Available Help at Discharge: Family;Available 24 hours/day Type of Home: House Home Access: Stairs to enter   [employer] Corporation[employer]trance Stairs-Number of Steps: 1 Home Layout: One level Home Equipment: Cane - single point;Walker - 4 wheels;Grab bars - tub/shower;Shower seat      Prior Function Level of Independence: Independent         Comments: Pt reports using her SPC in  the community; notes she is not very active in the home, but she does not use AD     Hand Dominance   Dominant Hand: Right    Extremity/Trunk Assessment   Upper Extremity Assessment: Generalized weakness;Difficult to assess due to impaired cognition;LUE deficits/detail (decreased grip strength L>R)           Lower Extremity Assessment: Generalized weakness;Difficult to assess due to impaired cognition;LLE deficits/detail (inconsistent with following commands during MMT)   LLE Deficits / Details: inconsistent at locating light pressure applied to her LE, correctly identifying 3/5.     Communication   Communication: Expressive difficulties;No difficulties (slurred speech)  Cognition Arousal/Alertness: Lethargic Behavior During Therapy: WFL for tasks assessed/performed Overall Cognitive Status: Impaired/Different from baseline Area of Impairment: Following commands       Following Commands: Follows one step commands with increased time;Follows multi-step commands inconsistently       General Comments: Pt reporting she is having trouble making her body do that she wants it to    General Comments      Exercises General Exercises - Lower Extremity Ankle Circles/Pumps: AROM;Both;5 reps Heel Slides: AROM;Both;5 reps      Assessment/Plan    PT Assessment Patient needs continued PT services  PT Diagnosis Difficulty walking;Generalized weakness;Acute pain   PT Problem List Decreased strength;Decreased activity tolerance;Decreased balance;Pain;Decreased cognition;Decreased coordination;Decreased mobility  PT Treatment Interventions DME instruction;Gait training;Stair training;Functional mobility training;Neuromuscular re-education;Manual techniques;Balance training;Patient/family education;Therapeutic exercise;Therapeutic activities   PT Goals (Current goals can be found in the Care Plan section) Acute Rehab PT Goals Patient Stated Goal: to get stronger PT Goal Formulation:  With patient Time For Goal Achievement: 04/02/16 Potential to Achieve Goals: Good    Frequency Min 3X/week   Barriers to discharge   1 step to enter home    Co-evaluation               End of Session Equipment Utilized During Treatment: Gait belt Activity Tolerance: Patient tolerated treatment well Patient left: in bed;with call bell/phone within reach;with family/visitor present;with bed alarm set           Time: [id number] PT Time Calculation (min) (ACUTE ONLY): 39 min   Charges:   PT Evaluation $PT Eval Moderate Complexity: 1 Procedure PT Treatments $Therapeutic Activity: 38-52 mins   PT G Codes:        12:18 PM,03/19/2016 Marylyn IshiharaSara Kiser PT, DPT [facility] [phone]

## 2016-03-19 NOTE — Progress Notes (Signed)
Patient has lost fourth 7 children 3 weeks ago. History of head small CVA possible slowly resolving TIA is alert and oriented this morning likewise has insulin-dependent diabetes with insulin resistance hypertension hyperlipidemia and revascularization of coronary arteries with restenting of stents due to progression of disease with a normal ejection fraction carotid ultrasound as well as 2-D echo ordered we'll order neurology consult for this today [name] [name] ZOX:[id number] DOB: 17-Apr-1936 DOA: 03/18/2016 PCP: [name], MD             Physical Exam: Blood pressure 125/85, pulse 67, temperature 98.3 F (36.8 C), temperature source Oral, resp. rate 20, height 5\' 4"  (1.626 m), weight 204 lb 6.4 oz (92.715 kg), SpO2 95 %. Alert and oriented lungs clear to A&P no rales wheeze rhonchi heart regular rhythm no S3-4 no heaves thrills rubs abdomen obese soft nontender no detectable organomegaly handgrip equal in both upper extremities plantars downgoing patient has persistence of mild right facial droop although apparently improving over what is described from admission   Investigations:  Recent Results (from the past 240 hour(s))  Culture, blood (Routine X 2) w Reflex to ID Panel     Status: None (Preliminary result)   Collection Time: 03/18/16  8:29 PM  Result Value Ref Range Status   Specimen Description RIGHT ANTECUBITAL  Final   Special Requests BOTTLES DRAWN AEROBIC AND ANAEROBIC 6CC  Final   Culture PENDING  Incomplete   Report Status PENDING  Incomplete  Culture, blood (Routine X 2) w Reflex to ID Panel     Status: None (Preliminary result)   Collection Time: 03/18/16  8:40 PM  Result Value Ref Range Status   Specimen Description BLOOD RIGHT HAND  Final   Special Requests BOTTLES DRAWN AEROBIC AND ANAEROBIC [facility]  Final   Culture PENDING  Incomplete   Report Status PENDING  Incomplete     Basic Metabolic Panel:  Recent Labs  81/19/14 0715 03/18/16 0722  NA 139  141  K 4.2 4.2  CL 104 101  CO2 27  --   GLUCOSE 214* 206*  BUN 8 6  CREATININE 0.68 0.60  CALCIUM 8.9  --    Liver Function Tests:  Recent Labs  03/18/16 0715  AST 19  ALT 14  ALKPHOS 84  BILITOT 0.3  PROT 7.0  ALBUMIN 3.8     CBC:  Recent Labs  03/18/16 0715 03/18/16 0722  WBC 9.1  --   NEUTROABS 3.8  --   HGB 10.6* 13.3  HCT 37.1 39.0  MCV 69.2*  --   PLT 283  --     Ct Head Wo Contrast  03/18/2016  CLINICAL DATA:  Altered mental status EXAM: CT HEAD WITHOUT CONTRAST TECHNIQUE: Contiguous axial images were obtained from the base of the skull through the vertex without intravenous contrast. COMPARISON:  December 19, 2015 FINDINGS: Mild diffuse atrophy is stable. There is no intracranial mass, hemorrhage, extra-axial fluid collection, or midline shift. There is patchy small vessel disease throughout the centra semiovale bilaterally, stable. There is no new gray-white compartment lesion. No acute infarct evident. The middle cerebral artery attenuation is symmetric and unchanged bilaterally. Bony calvarium appears intact. Mastoids on the right are clear. There is persistent opacification of several mastoid air cells on the left. There is mucosal thickening in several ethmoid air cells bilaterally. No intraorbital lesions are evident. IMPRESSION: Stable atrophy with patchy periventricular small vessel disease. No acute infarct evident. No new gray-white compartment lesion. No hemorrhage or mass  effect. Stable left-sided mastoid disease as well as bilateral ethmoid sinus disease present. Critical Value/emergent results were called by telephone at the time of interpretation on 03/18/2016 at 7:44 am to Dr. [name] , who verbally acknowledged these results. Electronically Signed   By: Bretta Bang III M.D.   On: 03/18/2016 07:44   [name] Wo Contrast  03/18/2016  CLINICAL DATA:  Altered mental status and weakness for 1 day. EXAM: MRI HEAD WITHOUT CONTRAST MRA HEAD  WITHOUT CONTRAST TECHNIQUE: Multiplanar, multiecho pulse sequences of the brain and surrounding structures were obtained without intravenous contrast. Angiographic images of the head were obtained using MRA technique without contrast. COMPARISON:  Head CT earlier today. Head MRI 08/29/2015. Head MRA 06/22/2012. FINDINGS: MRI HEAD FINDINGS Examination is mildly motion degraded. There is no evidence of acute infarct, intracranial hemorrhage, mass, midline shift, or extra-axial fluid collection. There is mild generalized cerebral atrophy. Patchy T2 hyperintensities involving the periventricular greater than subcortical cerebral white matter are similar to the prior MRI and nonspecific but compatible with moderate chronic small vessel ischemic disease. A chronic right thalamic lacunar infarct is unchanged. Prior bilateral cataract extraction is noted. There is mild left frontal and left ethmoid sinus mucosal thickening, and there are trace right and small left mastoid effusions. Major intracranial vascular flow voids are preserved. MRA HEAD FINDINGS The visualized distal vertebral arteries are patent and codominant without significant stenosis. Basilar artery is patent without stenosis. Right PICA origin is patent. There may be a severe right AICA origin stenosis. Right SCA is grossly patent with possible origin stenosis. Left SCA is not well evaluated. There is a fetal type origin of the right PCA. The left PCA demonstrates moderate P3 narrowing without significant proximal stenosis. The right PCA appears attenuated with areas of apparent moderate to severe narrowing involving the P2 segment as well as more distal branch vessels. Right PCA attenuation and P2 narrowing appears increased compared to the prior MRA. The internal carotid arteries are patent from skullbase to carotid termini without significant stenosis identified on the right. There may be mild narrowing of the left ICA at the level of the anterior genu. M1  segments are patent without evidence of significant stenosis. There is likely a moderate to severe stenosis involving the proximal M2 inferior division on the right. Mild MCA branch vessel irregularity is seen more distally bilaterally. The right A1 segment is again noted to be hypoplastic and irregularly narrowed. Left A1 segment is widely patent. Anterior communicating artery is patent. A2 segments are patent without evidence of significant stenosis. No definite intracranial aneurysm is identified, with minimal prominence of the anterior communicating artery similar to the prior study. IMPRESSION: 1. No acute intracranial abnormality. 2. Moderate chronic small vessel ischemic disease. 3. Severe right P2 PCA narrowing, increased from prior. 4. Mild left ICA stenosis. Moderate to severe right M2 MCA stenosis. Electronically Signed   By: Sebastian Ache M.D.   On: 03/18/2016 21:46   [name] Contrast  03/18/2016  CLINICAL DATA:  Altered mental status and weakness for 1 day. EXAM: MRI HEAD WITHOUT CONTRAST MRA HEAD WITHOUT CONTRAST TECHNIQUE: Multiplanar, multiecho pulse sequences of the brain and surrounding structures were obtained without intravenous contrast. Angiographic images of the head were obtained using MRA technique without contrast. COMPARISON:  Head CT earlier today. Head MRI 08/29/2015. Head MRA 06/22/2012. FINDINGS: MRI HEAD FINDINGS Examination is mildly motion degraded. There is no evidence of acute infarct, intracranial hemorrhage, mass, midline shift, or extra-axial fluid  collection. There is mild generalized cerebral atrophy. Patchy T2 hyperintensities involving the periventricular greater than subcortical cerebral white matter are similar to the prior MRI and nonspecific but compatible with moderate chronic small vessel ischemic disease. A chronic right thalamic lacunar infarct is unchanged. Prior bilateral cataract extraction is noted. There is mild left frontal and left ethmoid sinus mucosal  thickening, and there are trace right and small left mastoid effusions. Major intracranial vascular flow voids are preserved. MRA HEAD FINDINGS The visualized distal vertebral arteries are patent and codominant without significant stenosis. Basilar artery is patent without stenosis. Right PICA origin is patent. There may be a severe right AICA origin stenosis. Right SCA is grossly patent with possible origin stenosis. Left SCA is not well evaluated. There is a fetal type origin of the right PCA. The left PCA demonstrates moderate P3 narrowing without significant proximal stenosis. The right PCA appears attenuated with areas of apparent moderate to severe narrowing involving the P2 segment as well as more distal branch vessels. Right PCA attenuation and P2 narrowing appears increased compared to the prior MRA. The internal carotid arteries are patent from skullbase to carotid termini without significant stenosis identified on the right. There may be mild narrowing of the left ICA at the level of the anterior genu. M1 segments are patent without evidence of significant stenosis. There is likely a moderate to severe stenosis involving the proximal M2 inferior division on the right. Mild MCA branch vessel irregularity is seen more distally bilaterally. The right A1 segment is again noted to be hypoplastic and irregularly narrowed. Left A1 segment is widely patent. Anterior communicating artery is patent. A2 segments are patent without evidence of significant stenosis. No definite intracranial aneurysm is identified, with minimal prominence of the anterior communicating artery similar to the prior study. IMPRESSION: 1. No acute intracranial abnormality. 2. Moderate chronic small vessel ischemic disease. 3. Severe right P2 PCA narrowing, increased from prior. 4. Mild left ICA stenosis. Moderate to severe right M2 MCA stenosis. Electronically Signed   By: Sebastian Ache M.D.   On: 03/18/2016 21:46   Dg Chest Portable 1  View  03/18/2016  CLINICAL DATA:  Confusion and slurred speech EXAM: PORTABLE CHEST 1 VIEW COMPARISON:  November 19, 2015 FINDINGS: There is no edema or consolidation. Heart is upper normal in size with pulmonary vascularity within normal limits. Patient is status post coronary artery bypass grafting. No adenopathy. No bone lesions evident. There is postoperative change in the right proximal humeral region. IMPRESSION: No edema or consolidation. Electronically Signed   By: Bretta Bang III M.D.   On: 03/18/2016 08:13      Medications:  Impression: CVA possible TIA slowly resolving Coronary artery disease with revascularization Active Problems:   Hypothyroidism   DM type 2 causing complication (HCC)   Hyperlipidemia   Obesity   Essential hypertension   UTI (lower urinary tract infection)   Dysarthria     Plan: Monitor blood glucoses before meals and at bedtime sliding scale insulin coverage will obtain neurology consult to determine if this is CVA versus TIA which is slowly resolving patient is hemodynamically stable at present we'll monitor electrolytes glucoses and clinical course. Patient on full dose aspirin was given Plavix yesterday   Consultants: Neurology requested  Procedures CT of head MRI MRA of head   Antibiotics:                   Code Status: Full   Family Communication:  Spoke with patient  at length  Disposition Plan see plan above  Time spent: 30 minutes   LOS: 1 day   Devorah Givhan M   03/19/2016, 7:20 AM

## 2016-03-19 NOTE — Progress Notes (Signed)
Patient developed itch. Order received from Dr. Onalee Huaavid for Benadryl 25 mg po time one. Patient stated she gets itchy at home as well, and takes Benadryl when takes her Oxycodone.

## 2016-03-19 NOTE — Consult Note (Signed)
[place] A. Merlene Laughter, MD     [email or web address]          [name] [name] is an 80 y.o. female.   ASSESSMENT/PLAN: Acute encephalopathy. The patient does have a urinary tract infection which is most likely etiology. However, the patient seemed to have episodic symptoms associated with urinary incontinence. This is suggestive of possible seizures.  Intracranial occlusive disease. This is asymptomatic.  Dyslipidemia.  Vitamin B12 deficiency.  Acute gait impairment likely also due to urinary tract infection.   RECOMMENDATION: EEG. Replace vitamin B12. Increase pravastatin.   The patient is a 80 year old white female who presented to the hospital the day of admission with confusion and gait impairment. The patient apparently went to bed at 9:30 PM on the date of presentation and awoken to go to bathroom 1 AM. Her husband noted that she was confused disoriented and ataxic with significant problems walking around. The spell was associated with urinary incontinence. She went back to bed but woke up 2 hours later again with similar symptoms. She was taken to the emergency room for further evaluation. Imaging has been unrevealing but the workup has been significant for urinary tract infection. Patient remains confused and is unable to provide a history. The husband reports that she has had urinary tract infections in the past but never associated with confusion. The review of systems is very limited due to the confusion.  GENERAL: Obese lady who is drowsy but in no acute distress. She seems to be moaning for unknown reasons.  HEENT: Supple. Atraumatic normocephalic.   ABDOMEN: soft  EXTREMITIES: No edema   BACK: Normal.  SKIN: Normal by inspection.    MENTAL STATUS: She lays in bed with eyes closed. She does open her eyes to light sternal rub and follow simple commands. She keeps repeating "I don't know". Otherwise, there is no verbal output and is unable to  state the time or place.  CRANIAL NERVES: Pupils are equal, round and reactive to light and accommodation; extra ocular movements are full, there is no significant nystagmus; visual fields limited but appears to be full; upper and lower facial muscles are normal in strength and symmetric, there is no flattening of the nasolabial folds; tongue is midline; uvula is midline; shoulder elevation is normal.  MOTOR: Exact strength limited due to impaired cognition issues but she has at least 4/5 strength throughout.  COORDINATION: Left finger to nose is normal, right finger to nose is normal, No rest tremor; no intention tremor; no postural tremor; no bradykinesia.  REFLEXES: Deep tendon reflexes are symmetrical and normal. Babinski reflexes are flexor bilaterally.   SENSATION: Normal to pain.    Labs the past 6 months revealed a low vitamin B12 level in the 150s twice. TSH 1.29.   Blood pressure 150/56, pulse 96, temperature 100.2 F (37.9 C), temperature source Oral, resp. rate 20, height '5\' 4"'$  (1.626 m), weight 92.715 kg (204 lb 6.4 oz), SpO2 95 %.  Past Medical History  Diagnosis Date  . Arteriosclerotic cardiovascular disease (ASCVD) CARDIOLOGIST-   DR Lattie Haw     CABG surgery in 2001; nl stress nuclear-2007; angiography in 01/2006- TO of LAD; 70% ostial stenosis      of a small ramus; patent grafts; normal EF; cath in 12/2009 essentially unchanged; EF-66%;  Congestive heart failure with preserved LV systolic function  . Hypertension   . Varicose veins   . Hypothyroidism   . Insulin dependent type 2 diabetes mellitus ([place])   . Peripheral neuropathy (  [place])   . Diastolic CHF ([place])   . Hyperlipidemia   . History of CVA (cerebrovascular accident)     2013--  LACUNAR INFARCTIONS RIGHT THALAMUS---  residual left side of  lip numb  and left eye vision worse (which corrented lens implant from cataract extraction)  . Lesion of bladder   . Hematuria   . Arthritis   . DDD (degenerative disc  disease), lumbar   . OSA (obstructive sleep apnea)   . Chronic obstructive pulmonary disease (HCC)     Chronic bronchitis  . History of MI (myocardial infarction)     07/ 2001   s/p  cabg x2  . S/P CABG x 2     07-02-2000  . SUI (stress urinary incontinence, female)   . Lower urinary tract symptoms (LUTS)   . Psoriasis     lower leg  . Thinning of skin     Past Surgical History  Procedure Laterality Date  . Rotator cuff repair Right 1995  . Lumbar spine surgery  x2    yrs ago  . Carpal tunnel release Bilateral yrs ago  . Orif humerus fracture Right 04-18-2008  . Knee arthroscopy Left 10-18-2003    SYNOVECTOMY  . Repair flexor tendon hand Right 09-20-2008    CARPI RADIALIS TENDON TRANSFER TO EXTENSOR TO INDEX, MIDDLE, RING , LITTLE FINGERS AND DIGITI MININI  . Cardiovascular stress test  08-28-2011   DR ROTHBART    NEGATIVE NUCLEAR STUDY/  GLOBAL LVSF/  EF 65%  . Cholecystectomy  1990  . Appendectomy  age 41  . Total abdominal hysterectomy w/ bilateral salpingoophorectomy  age 6  . Cataract extraction w/ intraocular lens  implant, bilateral  2013  . Cardiac catheterization  1991    No sig. cad  . Coronary angioplasty  11-13-1999    balloon angioplasty to mLAD , d2 of LAD  . Coronary angioplasty with stent placement  01-17-2000    PCI stenting to mLAD & D2 of LAD  . Cardiac catheterization  07-01-2000    high grade in-stent restenosis  . Cardiac catheterization  04-30-2001    single native vessel cad/ graft patent  . Cardiac catheterization  02-13-2006  dr Olevia Perches    2V cad with total occluded LAD  and  70% ostial of small ramus/  grafts patent/  normal ef  . Cardiac catheterization  01-20-2000  dr Angelena Form    essentially unchanged;  severe single vessel cad with diffuse disease throughout CFX and RCA/ ef 66%;  chf with preserved LVSF  . Coronary artery bypass graft  07-02-2000   DR Darylene Price    SVG to diagonal, LIMA to LAD  . Transthoracic echocardiogram  12-12-2013      MILD LVH/  EF 57-32%/  GRADE I DIASTOLIC DYSFUNCTION/  MILD LAE  . Cystoscopy w/ retrogrades Bilateral 08/09/2014    Procedure: CYSTOSCOPY, BILATERAL RETROGRADE, HYDRODISTENSION, BLADDER BIOPSY WITH FULGERATION, INSTILL PYRIDIUM AND MARCAINE ;  Surgeon: Malka So, MD;  Location: [facility];  Service: Urology;  Laterality: Bilateral;  . Colonoscopy N/A 07/26/2015    Procedure: COLONOSCOPY;  Surgeon: Rogene Houston, MD;  [facility];  Service: Endoscopy;  Laterality: N/A;  1200    Family History  Problem Relation Age of Onset  . Coronary artery disease      Multiple first and second-degree relatives  . Aneurysm      Cerebral circulation  . Hypertension Mother   . Colon cancer Sister   .  Clotting disorder      Children diagnosed with hypercoagulable state    Social History:  reports that she has never smoked. She has never used smokeless tobacco. She reports that she does not drink alcohol or use illicit drugs.  Allergies:  Allergies  Allergen Reactions  . Codeine Itching    Medications: Prior to Admission medications   Medication Sig Start Date End Date Taking? Authorizing Provider  aspirin EC 325 MG tablet Take 325 mg by mouth daily.   Yes Historical Provider, MD  Insulin Degludec (TRESIBA FLEXTOUCH) 200 UNIT/ML SOPN Inject 35-70 Units into the skin 2 (two) times daily. 35 units in the morning and 70 units in the evening   Yes Historical Provider, MD  levothyroxine (SYNTHROID, LEVOTHROID) 25 MCG tablet Take 25 mcg by mouth every morning.    Yes Historical Provider, MD  lisinopril (PRINIVIL,ZESTRIL) 10 MG tablet Take 1 tablet by mouth every morning.  11/09/13  Yes Historical Provider, MD  metFORMIN (GLUCOPHAGE) 500 MG tablet Take 500 mg by mouth 2 (two) times daily with a meal.     Yes Historical Provider, MD  nitroGLYCERIN (NITROSTAT) 0.4 MG SL tablet Place 0.4 mg under the tongue every 5 (five) minutes as needed for chest pain.    Yes Historical  Provider, MD  NOVOLIN 70/30 RELION (70-30) 100 UNIT/ML injection Inject 20 Units into the skin 3 (three) times daily before meals. 02/25/16  Yes Historical Provider, MD  oxyCODONE-acetaminophen (PERCOCET) 7.5-325 MG tablet Take 1 tablet by mouth 4 (four) times daily as needed for moderate pain.  02/29/16  Yes Historical Provider, MD  pravastatin (PRAVACHOL) 20 MG tablet Take 20 mg by mouth every evening.    Yes Historical Provider, MD  zolpidem (AMBIEN) 5 MG tablet Take 5 mg by mouth at bedtime as needed for sleep.  08/12/14  Yes Historical Provider, MD  albuterol (PROVENTIL) (2.5 MG/3ML) 0.083% nebulizer solution Take 3 mLs (2.5 mg total) by nebulization every 6 (six) hours as needed for wheezing or shortness of breath. 09/11/14   Noemi Chapel, MD  diazepam (VALIUM) 5 MG tablet Take 5 mg by mouth every 6 (six) hours as needed for anxiety.    Historical Provider, MD  minocycline (MINOCIN,DYNACIN) 100 MG capsule Take 1 capsule (100 mg total) by mouth 2 (two) times daily. Patient not taking: Reported on 03/18/2016 11/22/15   Lucia Gaskins, MD    Scheduled Meds: . cefTRIAXone (ROCEPHIN)  IV  1 g Intravenous Q24H  . clopidogrel  75 mg Oral Daily  . enoxaparin (LOVENOX) injection  40 mg Subcutaneous Q24H  . hydrocerin   Topical Daily  . insulin aspart  0-9 Units Subcutaneous TID WC  . insulin aspart protamine- aspart  20 Units Subcutaneous TID WC  . levothyroxine  25 mcg Oral QAC breakfast  . lisinopril  10 mg Oral q morning - 10a  . pravastatin  20 mg Oral QPM   Continuous Infusions:  PRN Meds:.acetaminophen, albuterol, diazepam, LORazepam, nitroGLYCERIN, ondansetron (ZOFRAN) IV, oxyCODONE-acetaminophen, perflutren lipid microspheres (DEFINITY) IV suspension, senna-docusate, zolpidem     Results for orders placed or performed during the hospital encounter of 03/18/16 (from the past 48 hour(s))  Ethanol     Status: None   Collection Time: 03/18/16  7:15 AM  Result Value Ref Range   Alcohol,  Ethyl (B) <5 <5 mg/dL    Comment:        LOWEST DETECTABLE LIMIT FOR SERUM ALCOHOL IS 5 mg/dL FOR MEDICAL PURPOSES ONLY   Protime-INR  Status: None   Collection Time: 03/18/16  7:15 AM  Result Value Ref Range   Prothrombin Time 14.3 11.6 - 15.2 seconds   INR 1.09 0.00 - 1.49  APTT     Status: None   Collection Time: 03/18/16  7:15 AM  Result Value Ref Range   aPTT 28 24 - 37 seconds  CBC     Status: Abnormal   Collection Time: 03/18/16  7:15 AM  Result Value Ref Range   WBC 9.1 4.0 - 10.5 K/uL   RBC 5.36 (H) 3.87 - 5.11 MIL/uL   Hemoglobin 10.6 (L) 12.0 - 15.0 g/dL   HCT 37.1 36.0 - 46.0 %   MCV 69.2 (L) 78.0 - 100.0 fL   MCH 19.8 (L) 26.0 - 34.0 pg   MCHC 28.6 (L) 30.0 - 36.0 g/dL   RDW 18.8 (H) 11.5 - 15.5 %   Platelets 283 150 - 400 K/uL  Differential     Status: Abnormal   Collection Time: 03/18/16  7:15 AM  Result Value Ref Range   Neutrophils Relative % 41 %   Neutro Abs 3.8 1.7 - 7.7 K/uL   Lymphocytes Relative 41 %   Lymphs Abs 3.7 0.7 - 4.0 K/uL   Monocytes Relative 12 %   Monocytes Absolute 1.1 (H) 0.1 - 1.0 K/uL   Eosinophils Relative 5 %   Eosinophils Absolute 0.4 0.0 - 0.7 K/uL   Basophils Relative 1 %   Basophils Absolute 0.1 0.0 - 0.1 K/uL  Comprehensive metabolic panel     Status: Abnormal   Collection Time: 03/18/16  7:15 AM  Result Value Ref Range   Sodium 139 135 - 145 mmol/L   Potassium 4.2 3.5 - 5.1 mmol/L   Chloride 104 101 - 111 mmol/L   CO2 27 22 - 32 mmol/L   Glucose, Bld 214 (H) 65 - 99 mg/dL   BUN 8 6 - 20 mg/dL   Creatinine, Ser 0.68 0.44 - 1.00 mg/dL   Calcium 8.9 8.9 - 10.3 mg/dL   Total Protein 7.0 6.5 - 8.1 g/dL   Albumin 3.8 3.5 - 5.0 g/dL   AST 19 15 - 41 U/L   ALT 14 14 - 54 U/L   Alkaline Phosphatase 84 38 - 126 U/L   Total Bilirubin 0.3 0.3 - 1.2 mg/dL   GFR calc non Af Amer >60 >60 mL/min   GFR calc Af Amer >60 >60 mL/min    Comment: (NOTE) The eGFR has been calculated using the CKD EPI equation. This calculation  has not been validated in all clinical situations. eGFR's persistently <60 mL/min signify possible Chronic Kidney Disease.    Anion gap 8 5 - 15  Troponin I     Status: None   Collection Time: 03/18/16  7:15 AM  Result Value Ref Range   Troponin I <0.03 <0.031 ng/mL    Comment:        NO INDICATION OF MYOCARDIAL INJURY.   Hemoglobin A1c     Status: Abnormal   Collection Time: 03/18/16  7:15 AM  Result Value Ref Range   Hgb A1c MFr Bld 9.5 (H) 4.8 - 5.6 %    Comment: (NOTE)         Pre-diabetes: 5.7 - 6.4         Diabetes: >6.4         Glycemic control for adults with diabetes: <7.0    Mean Plasma Glucose 226 mg/dL    Comment: (NOTE) Performed At: [facility]  [place], [place] [id number] Lindon Romp MD OA:[id number]   I-stat troponin, ED (not at [facility], [facility])     Status: None   Collection Time: 03/18/16  7:20 AM  Result Value Ref Range   Troponin i, poc 0.00 0.00 - 0.08 ng/mL   Comment 3            Comment: Due to the release kinetics of cTnI, a negative result within the first hours of the onset of symptoms does not rule out myocardial infarction with certainty. If myocardial infarction is still suspected, repeat the test at appropriate intervals.   I-Stat Chem 8, ED  (not at [facility], [facility])     Status: Abnormal   Collection Time: 03/18/16  7:22 AM  Result Value Ref Range   Sodium 141 135 - 145 mmol/L   Potassium 4.2 3.5 - 5.1 mmol/L   Chloride 101 101 - 111 mmol/L   BUN 6 6 - 20 mg/dL   Creatinine, Ser 0.60 0.44 - 1.00 mg/dL   Glucose, Bld 206 (H) 65 - 99 mg/dL   Calcium, Ion 1.18 1.13 - 1.30 mmol/L   TCO2 27 0 - 100 mmol/L   Hemoglobin 13.3 12.0 - 15.0 g/dL   HCT 39.0 36.0 - 46.0 %  Blood gas, arterial     Status: Abnormal   Collection Time: 03/18/16  8:10 AM  Result Value Ref Range   FIO2 21.00    pH, Arterial 7.400 7.350 - 7.450   pCO2 arterial 44.6 35.0 - 45.0 mmHg   pO2, Arterial 70.1 (L) 80.0 - 100.0 mmHg   Bicarbonate 26.5 (H)  20.0 - 24.0 mEq/L   Acid-Base Excess 2.6 (H) 0.0 - 2.0 mmol/L   O2 Saturation 92.7 %   Collection site RIGHT BRACHIAL    Drawn by [id number]    Sample type ARTERIAL    Allens test (pass/fail) PASS PASS  Urine culture     Status: None (Preliminary result)   Collection Time: 03/18/16  8:20 AM  Result Value Ref Range   Specimen Description URINE, CATHETERIZED    Special Requests NONE    Culture      >=100,000 COLONIES/mL GRAM NEGATIVE RODS CULTURE REINCUBATED FOR BETTER GROWTH Performed at [facility]    Report Status PENDING   Urine rapid drug screen (hosp performed)not at [facility]     Status: Abnormal   Collection Time: 03/18/16  8:32 AM  Result Value Ref Range   Opiates NONE DETECTED NONE DETECTED   Cocaine NONE DETECTED NONE DETECTED   Benzodiazepines POSITIVE (A) NONE DETECTED   Amphetamines NONE DETECTED NONE DETECTED   Tetrahydrocannabinol NONE DETECTED NONE DETECTED   Barbiturates NONE DETECTED NONE DETECTED    Comment:        DRUG SCREEN FOR MEDICAL PURPOSES ONLY.  IF CONFIRMATION IS NEEDED FOR ANY PURPOSE, NOTIFY LAB WITHIN 5 DAYS.        LOWEST DETECTABLE LIMITS FOR URINE DRUG SCREEN Drug Class       Cutoff (ng/mL) Amphetamine      1000 Barbiturate      200 Benzodiazepine   355 Tricyclics       732 Opiates          300 Cocaine          300 THC              50   Urinalysis, Routine w reflex microscopic (not at [facility])     Status: Abnormal   Collection Time:  03/18/16  8:32 AM  Result Value Ref Range   Color, Urine YELLOW YELLOW   APPearance CLOUDY (A) CLEAR   Specific Gravity, Urine 1.010 1.005 - 1.030   pH 6.0 5.0 - 8.0   Glucose, UA >1000 (A) NEGATIVE mg/dL   Hgb urine dipstick NEGATIVE NEGATIVE   Bilirubin Urine NEGATIVE NEGATIVE   Ketones, ur NEGATIVE NEGATIVE mg/dL   Protein, ur NEGATIVE NEGATIVE mg/dL   Nitrite POSITIVE (A) NEGATIVE   Leukocytes, UA MODERATE (A) NEGATIVE  Urine microscopic-add on     Status: Abnormal   Collection Time: 03/18/16   8:32 AM  Result Value Ref Range   Squamous Epithelial / LPF 0-5 (A) NONE SEEN   WBC, UA TOO NUMEROUS TO COUNT 0 - 5 WBC/hpf   RBC / HPF NONE SEEN 0 - 5 RBC/hpf   Bacteria, UA MANY (A) NONE SEEN  Glucose, capillary     Status: Abnormal   Collection Time: 03/18/16  6:19 PM  Result Value Ref Range   Glucose-Capillary 285 (H) 65 - 99 mg/dL   Comment 1 Notify RN    Comment 2 Document in Chart   Glucose, capillary     Status: Abnormal   Collection Time: 03/18/16  8:26 PM  Result Value Ref Range   Glucose-Capillary 265 (H) 65 - 99 mg/dL   Comment 1 Notify RN    Comment 2 Document in Chart   Culture, blood (Routine X 2) w Reflex to ID Panel     Status: None (Preliminary result)   Collection Time: 03/18/16  8:29 PM  Result Value Ref Range   Specimen Description RIGHT ANTECUBITAL    Special Requests BOTTLES DRAWN AEROBIC AND ANAEROBIC 6CC    Culture NO GROWTH < 24 HOURS    Report Status PENDING   Culture, blood (Routine X 2) w Reflex to ID Panel     Status: None (Preliminary result)   Collection Time: 03/18/16  8:40 PM  Result Value Ref Range   Specimen Description BLOOD RIGHT HAND    Special Requests BOTTLES DRAWN AEROBIC AND ANAEROBIC 6CC    Culture NO GROWTH < 24 HOURS    Report Status PENDING   Lipid panel     Status: Abnormal   Collection Time: 03/19/16  5:30 AM  Result Value Ref Range   Cholesterol 204 (H) 0 - 200 mg/dL   Triglycerides 472 (H) <150 mg/dL   HDL 29 (L) >40 mg/dL   Total CHOL/HDL Ratio 7.0 RATIO   VLDL UNABLE TO CALCULATE IF TRIGLYCERIDE OVER 400 mg/dL 0 - 40 mg/dL   LDL Cholesterol UNABLE TO CALCULATE IF TRIGLYCERIDE OVER 400 mg/dL 0 - 99 mg/dL    Comment:        Total Cholesterol/HDL:CHD Risk Coronary Heart Disease Risk Table                     Men   Women  1/2 Average Risk   3.4   3.3  Average Risk       5.0   4.4  2 X Average Risk   9.6   7.1  3 X Average Risk  23.4   11.0        Use the calculated Patient Ratio above and the CHD Risk Table to  determine the patient's CHD Risk.        ATP III CLASSIFICATION (LDL):  <100     mg/dL   Optimal  100-129  mg/dL   Near or Above  Optimal  130-159  mg/dL   Borderline  160-189  mg/dL   High  >190     mg/dL   Very High   Glucose, capillary     Status: Abnormal   Collection Time: 03/19/16  7:30 AM  Result Value Ref Range   Glucose-Capillary 255 (H) 65 - 99 mg/dL  Glucose, capillary     Status: Abnormal   Collection Time: 03/19/16 11:47 AM  Result Value Ref Range   Glucose-Capillary 170 (H) 65 - 99 mg/dL    Studies/Results:  BRAIN MRI/MRA MRI HEAD FINDINGS  Examination is mildly motion degraded.  There is no evidence of acute infarct, intracranial hemorrhage, mass, midline shift, or extra-axial fluid collection. There is mild generalized cerebral atrophy. Patchy T2 hyperintensities involving the periventricular greater than subcortical cerebral white matter are similar to the prior MRI and nonspecific but compatible with moderate chronic small vessel ischemic disease. A chronic right thalamic lacunar infarct is unchanged.  Prior bilateral cataract extraction is noted. There is mild left frontal and left ethmoid sinus mucosal thickening, and there are trace right and small left mastoid effusions. Major intracranial vascular flow voids are preserved.  MRA HEAD FINDINGS  The visualized distal vertebral arteries are patent and codominant without significant stenosis. Basilar artery is patent without stenosis. Right PICA origin is patent. There may be a severe right AICA origin stenosis. Right SCA is grossly patent with possible origin stenosis. Left SCA is not well evaluated. There is a fetal type origin of the right PCA. The left PCA demonstrates moderate P3 narrowing without significant proximal stenosis. The right PCA appears attenuated with areas of apparent moderate to severe narrowing involving the P2 segment as well as more distal  branch vessels. Right PCA attenuation and P2 narrowing appears increased compared to the prior MRA.  The internal carotid arteries are patent from skullbase to carotid termini without significant stenosis identified on the right. There may be mild narrowing of the left ICA at the level of the anterior genu. M1 segments are patent without evidence of significant stenosis. There is likely a moderate to severe stenosis involving the proximal M2 inferior division on the right. Mild MCA branch vessel irregularity is seen more distally bilaterally.  The right A1 segment is again noted to be hypoplastic and irregularly narrowed. Left A1 segment is widely patent. Anterior communicating artery is patent. A2 segments are patent without evidence of significant stenosis. No definite intracranial aneurysm is identified, with minimal prominence of the anterior communicating artery similar to the prior study.  IMPRESSION: 1. No acute intracranial abnormality. 2. Moderate chronic small vessel ischemic disease. 3. Severe right P2 PCA narrowing, increased from prior. 4. Mild left ICA stenosis. Moderate to severe right M2 MCA stenosis.       [name] [name], M.D.  Diplomate, Tax adviser of Psychiatry and Neurology ( Neurology). 03/19/2016, 5:13 PM

## 2016-03-20 ENCOUNTER — Inpatient Hospital Stay (HOSPITAL_COMMUNITY)
Admit: 2016-03-20 | Discharge: 2016-03-20 | Disposition: A | Discharge status: Home / Self Care | Payer: Medicare HMO | Attending: Neurology | Admitting: Neurology

## 2016-03-20 LAB — BASIC METABOLIC PANEL
Anion gap: 8 (ref 5–15)
BUN: 10 mg/dL (ref 6–20)
CHLORIDE: 101 mmol/L (ref 101–111)
CO2: 28 mmol/L (ref 22–32)
Calcium: 8.5 mg/dL — ABNORMAL LOW (ref 8.9–10.3)
Creatinine, Ser: 0.66 mg/dL (ref 0.44–1.00)
GFR calc Af Amer: >60 mL/min (ref >60)
GFR calc non Af Amer: >60 mL/min (ref >60)
GLUCOSE: 169 mg/dL — AB (ref 65–99)
POTASSIUM: 4 mmol/L (ref 3.5–5.1)
Sodium: 137 mmol/L (ref 135–145)

## 2016-03-20 LAB — HEMOGLOBIN A1C
Hgb A1c MFr Bld: 9.9 % — ABNORMAL HIGH (ref 4.8–5.6)
MEAN PLASMA GLUCOSE: 237 mg/dL

## 2016-03-20 LAB — GLUCOSE, CAPILLARY
GLUCOSE-CAPILLARY: 231 mg/dL — AB (ref 65–99)
GLUCOSE-CAPILLARY: 244 mg/dL — AB (ref 65–99)
Glucose-Capillary: 170 mg/dL — ABNORMAL HIGH (ref 65–99)
Glucose-Capillary: 161 mg/dL — ABNORMAL HIGH (ref 65–99)

## 2016-03-20 MED ORDER — PRAVASTATIN SODIUM 40 MG PO TABS
80.0000 mg | ORAL_TABLET | Freq: Every evening | ORAL | Status: DC
  Administered 2016-03-20 – 2016-03-21 (×2): 80 mg via ORAL
  Filled 2016-03-20 (×2): qty 2

## 2016-03-20 MED ORDER — CYANOCOBALAMIN 1000 MCG/ML IJ SOLN
1000.0000 ug | INTRAMUSCULAR | Status: AC
  Administered 2016-03-20: 1000 ug via INTRAMUSCULAR
  Filled 2016-03-20: qty 1

## 2016-03-20 NOTE — Progress Notes (Signed)
EEG Completed; Results Pending  

## 2016-03-20 NOTE — Clinical Social Work Note (Signed)
Clinical Social Work Assessment  Patient Details  Name: [name] AuerRuby J [name] MRN: [id number] Date of Birth: 1936/11/01  Date of referral:  03/20/16               Reason for consult:  Facility Placement                Permission sought to share information with:    Permission granted to share information::     Name::        Agency::     Relationship::     Contact Information:     Housing/Transportation Living arrangements for the past 2 months:  Single Family Home Source of Information:  Patient, Spouse Patient Interpreter Needed:  None Criminal Activity/Legal Involvement Pertinent to Current Situation/Hospitalization:  No - Comment as needed Significant Relationships:  Adult Children, Spouse Lives with:  Adult Children, Spouse Do you feel safe going back to the place where you live?  Yes Need for family participation in patient care:  Yes (Comment)  Care giving concerns: None identified.   Social Worker assessment / plan:  Patient's husband, Mr. Annabell SabalMcMillion, advised that him, patient and their son and daughter live in the home together.  Patient advised that while she has both a walker and a cane that she typically ambulates with independently and completes ADLs independently. CSW discussed PT recommendation of SNF for short term rehab. Patient stated that she did not want to go. Mr. Annabell SabalMcMillion stated to patient that he felt it would be beneficial for patient to go.  Patient stated that she would consider going. [facility] and [facility] would be their second choice if she chose to go.   Employment status:  Retired Database administratornsurance information:  Managed Medicare PT Recommendations:  Skilled Nursing Facility Information / Referral to community resources:  Skilled Nursing Facility  Patient/Family's Response to care:  Patient is considering if she will go to a SNF for rehab.   Patient/Family's Understanding of and Emotional Response to Diagnosis, Current Treatment,  and Prognosis: Patient is confused about why she is experiencing increased confusion at this time.   Emotional Assessment Appearance:  Developmentally appropriate Attitude/Demeanor/Rapport:   (Cooperative) Affect (typically observed):  Calm, Accepting Orientation:  Oriented to Self, Oriented to Place, Oriented to Situation Alcohol / Substance use:  Not Applicable Psych involvement (Current and /or in the community):  No (Comment)  Discharge Needs  Concerns to be addressed:  Discharge Planning Concerns Readmission within the last 30 days:  Yes Current discharge risk:  None Barriers to Discharge:  No Barriers Identified   Annice NeedySettle, Shaunika Italiano D, LCSW 03/20/2016, 11:48 AM

## 2016-03-20 NOTE — Progress Notes (Signed)
SLP Cancellation Note  Patient Details Name: [name] [name] MRN: [id number] DOB: 1936-06-30   Cancelled treatment:       Reason Eval/Treat Not Completed: Patient at procedure or test/unavailable; SLP ordered as part of stroke protocol, however pt passed RN swallow screen and other symptoms reportedly resolved. SLP unable to screen pt at this time due to EEG in process. Will re-attempt as schedule permits.  Thank you,  Havery MorosDabney Shareka Casale, CCC-SLP [phone]    [name] [name] 03/20/2016, 3:13 PM

## 2016-03-20 NOTE — Procedures (Signed)
[facility] Dorathea Faerber A. Gerilyn Pilgrimoonquah, MD     [email or web address]           HISTORY: The patient is 80 year old female who presents with episodic confusion. The study is being done to evaluate for seizures as a etiology of these events.  MEDICATIONS: Scheduled Meds: . cefTRIAXone (ROCEPHIN)  IV  1 g Intravenous Q24H  . clopidogrel  75 mg Oral Daily  . enoxaparin (LOVENOX) injection  40 mg Subcutaneous Q24H  . hydrocerin   Topical Daily  . insulin aspart  0-9 Units Subcutaneous TID WC  . insulin aspart protamine- aspart  20 Units Subcutaneous TID WC  . levothyroxine  25 mcg Oral QAC breakfast  . lisinopril  10 mg Oral q morning - 10a  . pravastatin  80 mg Oral QPM   Continuous Infusions:  PRN Meds:.acetaminophen, albuterol, diazepam, LORazepam, nitroGLYCERIN, ondansetron (ZOFRAN) IV, oxyCODONE-acetaminophen, senna-docusate, zolpidem  Prior to Admission medications   Medication Sig Start Date End Date Taking? Authorizing Provider  aspirin EC 325 MG tablet Take 325 mg by mouth daily.   Yes Historical Provider, MD  Insulin Degludec (TRESIBA FLEXTOUCH) 200 UNIT/ML SOPN Inject 35-70 Units into the skin 2 (two) times daily. 35 units in the morning and 70 units in the evening   Yes Historical Provider, MD  levothyroxine (SYNTHROID, LEVOTHROID) 25 MCG tablet Take 25 mcg by mouth every morning.    Yes Historical Provider, MD  lisinopril (PRINIVIL,ZESTRIL) 10 MG tablet Take 1 tablet by mouth every morning.  11/09/13  Yes Historical Provider, MD  metFORMIN (GLUCOPHAGE) 500 MG tablet Take 500 mg by mouth 2 (two) times daily with a meal.     Yes Historical Provider, MD  nitroGLYCERIN (NITROSTAT) 0.4 MG SL tablet Place 0.4 mg under the tongue every 5 (five) minutes as needed for chest pain.    Yes Historical Provider, MD  NOVOLIN 70/30 RELION (70-30) 100 UNIT/ML injection Inject 20 Units into the skin 3 (three) times daily before meals. 02/25/16  Yes Historical Provider, MD    oxyCODONE-acetaminophen (PERCOCET) 7.5-325 MG tablet Take 1 tablet by mouth 4 (four) times daily as needed for moderate pain.  02/29/16  Yes Historical Provider, MD  pravastatin (PRAVACHOL) 20 MG tablet Take 20 mg by mouth every evening.    Yes Historical Provider, MD  zolpidem (AMBIEN) 5 MG tablet Take 5 mg by mouth at bedtime as needed for sleep.  08/12/14  Yes Historical Provider, MD  albuterol (PROVENTIL) (2.5 MG/3ML) 0.083% nebulizer solution Take 3 mLs (2.5 mg total) by nebulization every 6 (six) hours as needed for wheezing or shortness of breath. 09/11/14   Eber HongBrian Miller, MD  diazepam (VALIUM) 5 MG tablet Take 5 mg by mouth every 6 (six) hours as needed for anxiety.    Historical Provider, MD  minocycline (MINOCIN,DYNACIN) 100 MG capsule Take 1 capsule (100 mg total) by mouth 2 (two) times daily. Patient not taking: Reported on 03/18/2016 11/22/15   Oval Linseyichard [name], MD      ANALYSIS: A 16 channel recording using standard 10 20 measurements is conducted for 22 minutes. There is a well-formed posterior dominant rhythm of 7-7-1/2 Hz which attenuates with eye opening. There is beta activity observed in the frontal areas. Awake and sleep architecture is observed. K complexes and sleep spindles are recorded indicating stage II non-REM sleep. Photic scintillations carried out without abnormal changes in the background activity. There is no focal or lateral slowing. There is no epileptiform activity is observed.   IMPRESSION: This is a normal  recording of the awake and sleep states.      Kayelyn Lemon A. Gerilyn Pilgrim, M.D.  Diplomate, Biomedical engineer of Psychiatry and Neurology ( Neurology).

## 2016-03-20 NOTE — NC FL2 (Signed)
[place] MEDICAID FL2 LEVEL OF CARE SCREENING TOOL     IDENTIFICATION  Patient Name: [name] [name] Birthdate: June 24, 1936 Sex: female Admission Date (Current Location): 03/18/2016  [facility] and [place] Number:  [employer] and Address:  [facility],  [address], [place] [address]      Provider Number: [id number]  Attending Physician Name and Address:  Oval Linsey, MD  Relative Name and Phone Number:       Current Level of Care: Hospital Recommended Level of Care: Skilled Nursing Facility Prior Approval Number:    Date Approved/Denied:   PASRR Number:  [id number])  Discharge Plan: SNF    Current Diagnoses: Patient Active Problem List   Diagnosis Date Noted  . Dysarthria 03/18/2016  . Influenza-like illness 11/19/2015  . Anemia of chronic disease 11/19/2015  . OSA (obstructive sleep apnea) 11/19/2015  . Left facial numbness 08/29/2015  . Gait instability 06/26/2015  . Bronchitis 11/13/2014  . COPD exacerbation (HCC) 11/13/2014  . UTI (urinary tract infection) 08/26/2014  . Near syncope 08/24/2014  . UTI (lower urinary tract infection) 12/10/2013  . Hypoxia 12/10/2013  . Weakness generalized 12/10/2013  . TIA (transient ischemic attack) 06/22/2012  . Left-sided headache 06/22/2012  . Noncompliance 06/22/2012  . Noncompliance with CPAP treatment 06/22/2012  . Psoriasis 01/07/2012  . Cerebrovascular disease   . Chronic back pain   . Chronic obstructive pulmonary disease (HCC)   . DM type 2 causing complication (HCC) 02/14/2010  . Obesity 02/14/2010  . Arteriosclerotic cardiovascular disease (ASCVD) 02/14/2010  . Hypothyroidism 02/06/2010  . Hyperlipidemia 02/06/2010  . Essential hypertension 02/06/2010  . Sleep apnea 02/06/2010    Orientation RESPIRATION BLADDER Height & Weight     Self, Situation, Place  Normal Incontinent Weight: 209 lb 11.2 oz (95.119 kg) Height:   (162.6 cm)  BEHAVIORAL SYMPTOMS/MOOD  NEUROLOGICAL BOWEL NUTRITION STATUS      Continent Diet (Carb modified)  AMBULATORY STATUS COMMUNICATION OF NEEDS Skin   Extensive Assist Verbally Normal                       Personal Care Assistance Level of Assistance  Bathing, Dressing Bathing Assistance: Limited assistance         Functional Limitations Info             SPECIAL CARE FACTORS FREQUENCY  PT (By licensed PT)     PT Frequency:  (5x/week)              Contractures      Additional Factors Info  Insulin Sliding Scale, Psychotropic     Psychotropic Info:  (Ativan, Valium) Insulin Sliding Scale Info:  (3x daily)       Current Medications (03/20/2016):  This is the current hospital active medication list Current Facility-Administered Medications  Medication Dose Route Frequency Provider Last Rate Last Dose  . acetaminophen (TYLENOL) tablet 650 mg  650 mg Oral Q6H PRN Leda Gauze, NP   650 mg at 03/19/16 1809  . albuterol (PROVENTIL) (2.5 MG/3ML) 0.083% nebulizer solution 2.5 mg  2.5 mg Nebulization Q6H PRN Henderson Cloud, MD      . cefTRIAXone (ROCEPHIN) 1 g in dextrose 5 % 50 mL IVPB  1 g Intravenous Q24H Henderson Cloud, MD   1 g at 03/20/16 0834  . clopidogrel (PLAVIX) tablet 75 mg  75 mg Oral Daily Henderson Cloud, MD   75 mg at 03/20/16 1100  .  diazepam (VALIUM) tablet 5 mg  5 mg Oral Q6H PRN Henderson CloudEstela Y Hernandez Acosta, MD   5 mg at 03/18/16 2345  . enoxaparin (LOVENOX) injection 40 mg  40 mg Subcutaneous Q24H Henderson CloudEstela Y Hernandez Acosta, MD   40 mg at 03/19/16 1808  . hydrocerin (EUCERIN) cream   Topical Daily Oval Linseyichard [name], MD   1 application at 03/20/16 1100  . insulin aspart (novoLOG) injection 0-9 Units  0-9 Units Subcutaneous TID WC Estela Isaiah BlakesY Hernandez Acosta, MD   2 Units at 03/20/16 1216  . insulin aspart protamine- aspart (NOVOLOG MIX 70/30) injection 20 Units  20 Units Subcutaneous TID WC Estela Isaiah BlakesY Hernandez Acosta, MD   20 Units at 03/20/16 1217   . levothyroxine (SYNTHROID, LEVOTHROID) tablet 25 mcg  25 mcg Oral QAC breakfast Henderson CloudEstela Y Hernandez Acosta, MD   25 mcg at 03/20/16 [phone]  . lisinopril (PRINIVIL,ZESTRIL) tablet 10 mg  10 mg Oral q morning - 10a Estela Isaiah BlakesY Hernandez Acosta, MD   10 mg at 03/20/16 1100  . LORazepam (ATIVAN) tablet 0.5 mg  0.5 mg Oral Q6H PRN Oval Linseyichard [name], MD   0.5 mg at 03/19/16 0912  . nitroGLYCERIN (NITROSTAT) SL tablet 0.4 mg  0.4 mg Sublingual Q5 min PRN Henderson CloudEstela Y Hernandez Acosta, MD      . ondansetron [facility](ZOFRAN) injection 4 mg  4 mg Intravenous Q6H PRN Henderson CloudEstela Y Hernandez Acosta, MD   4 mg at 03/18/16 1846  . oxyCODONE-acetaminophen (PERCOCET) 7.5-325 MG per tablet 1 tablet  1 tablet Oral QID PRN Henderson CloudEstela Y Hernandez Acosta, MD   1 tablet at 03/19/16 [phone]  . pravastatin (PRAVACHOL) tablet 80 mg  80 mg Oral QPM Beryle BeamsKofi Doonquah, MD      . senna-docusate (Senokot-S) tablet 1 tablet  1 tablet Oral QHS PRN Henderson CloudEstela Y Hernandez Acosta, MD      . zolpidem [facility](AMBIEN) tablet 5 mg  5 mg Oral QHS PRN Henderson CloudEstela Y Hernandez Acosta, MD         Discharge Medications: Please see discharge summary for a list of discharge medications.  Relevant Imaging Results:  Relevant Lab Results:   Additional Information    Kenyotta Dorfman, Juleen ChinaHeather D, LCSW

## 2016-03-20 NOTE — Progress Notes (Signed)
Patient ID: [name] [name], female   DOB: 05-Oct-1936, 80 y.o.   MRN: [id number]   Shiner A. Merlene Laughter, MD     [email or web address]          [name] [name] is an 80 y.o. female.   Assessment/Plan: Acute encephalopathy. The patient does have a urinary tract infection which is most likely etiology. However, the patient seemed to have episodic symptoms associated with urinary incontinence. This is suggestive of possible seizures.  Intracranial occlusive disease. This is asymptomatic.  Dyslipidemia.  Vitamin B12 deficiency.  Acute gait impairment likely also due to urinary tract infection.   RECOMMENDATION: Fu EEG. Replace vitamin B12. Increase pravastatin.      It appears the patient has improved significantly.   ENERAL: Obese lady who is drowsy but in no acute distress. She seems to be moaning for unknown reasons.  HEENT: Supple. Atraumatic normocephalic.   ABDOMEN: soft  EXTREMITIES: No edema   BACK: Normal.  SKIN: Normal by inspection.   MENTAL STATUS: She is sleeping with easily arousable to verbal commands. She is oriented to place, month and year. She is more lucid and coherent today.  CRANIAL NERVES: Pupils are equal, round and reactive to light and accommodation; extra ocular movements are full, there is no significant nystagmus; visual fields limited but appears to be full; upper and lower facial muscles are normal in strength and symmetric, there is no flattening of the nasolabial folds; tongue is midline; uvula is midline; shoulder elevation is normal.  MOTOR: Exact strength limited due to impaired cognition issues but she has at least 4/5 strength throughout.  COORDINATION: Left finger to nose is normal, right finger to nose is normal, No rest tremor; no intention tremor; no postural tremor; no bradykinesia.   Labs the past 6 months revealed a low vitamin B12 level in the 150s twice. TSH 1.29.       Objective: Vital signs  in last 24 hours: Temp:  [98.6 F (37 C)-99.8 F (37.7 C)] 98.7 F (37.1 C) (03/29 1419) Pulse Rate:  [72-84] 72 (03/29 1419) Resp:  [20] 20 (03/29 1419) BP: (113-134)/(39-60) 132/54 mmHg (03/29 1419) SpO2:  [92 %-96 %] 94 % (03/29 1419) Weight:  [95.119 kg (209 lb 11.2 oz)] 95.119 kg (209 lb 11.2 oz) (03/29 0542)  Intake/Output from previous day: 03/28 0701 - 03/29 0700 In: 480 [P.O.:480] Out: -  Intake/Output this shift:   Nutritional status: Diet Carb Modified Fluid consistency:: Thin; Room service appropriate?: Yes   Lab Results: Results for orders placed or performed during the hospital encounter of 03/18/16 (from the past 48 hour(s))  Glucose, capillary     Status: Abnormal   Collection Time: 03/18/16  6:19 PM  Result Value Ref Range   Glucose-Capillary 285 (H) 65 - 99 mg/dL   Comment 1 Notify RN    Comment 2 Document in Chart   Glucose, capillary     Status: Abnormal   Collection Time: 03/18/16  8:26 PM  Result Value Ref Range   Glucose-Capillary 265 (H) 65 - 99 mg/dL   Comment 1 Notify RN    Comment 2 Document in Chart   Culture, blood (Routine X 2) w Reflex to ID Panel     Status: None (Preliminary result)   Collection Time: 03/18/16  8:29 PM  Result Value Ref Range   Specimen Description BLOOD RIGHT ANTECUBITAL    Special Requests BOTTLES DRAWN AEROBIC AND ANAEROBIC 6CC    Culture NO GROWTH 2 DAYS  Report Status PENDING   Culture, blood (Routine X 2) w Reflex to ID Panel     Status: None (Preliminary result)   Collection Time: 03/18/16  8:40 PM  Result Value Ref Range   Specimen Description BLOOD RIGHT HAND    Special Requests BOTTLES DRAWN AEROBIC AND ANAEROBIC 6CC    Culture NO GROWTH 2 DAYS    Report Status PENDING   Lipid panel     Status: Abnormal   Collection Time: 03/19/16  5:30 AM  Result Value Ref Range   Cholesterol 204 (H) 0 - 200 mg/dL   Triglycerides 472 (H) <150 mg/dL   HDL 29 (L) >40 mg/dL   Total CHOL/HDL Ratio 7.0 RATIO   VLDL UNABLE  TO CALCULATE IF TRIGLYCERIDE OVER 400 mg/dL 0 - 40 mg/dL   LDL Cholesterol UNABLE TO CALCULATE IF TRIGLYCERIDE OVER 400 mg/dL 0 - 99 mg/dL    Comment:        Total Cholesterol/HDL:CHD Risk Coronary Heart Disease Risk Table                     Men   Women  1/2 Average Risk   3.4   3.3  Average Risk       5.0   4.4  2 X Average Risk   9.6   7.1  3 X Average Risk  23.4   11.0        Use the calculated Patient Ratio above and the CHD Risk Table to determine the patient's CHD Risk.        ATP III CLASSIFICATION (LDL):  <100     mg/dL   Optimal  100-129  mg/dL   Near or Above                    Optimal  130-159  mg/dL   Borderline  160-189  mg/dL   High  >190     mg/dL   Very High   Glucose, capillary     Status: Abnormal   Collection Time: 03/19/16  7:30 AM  Result Value Ref Range   Glucose-Capillary 255 (H) 65 - 99 mg/dL  Glucose, capillary     Status: Abnormal   Collection Time: 03/19/16 11:47 AM  Result Value Ref Range   Glucose-Capillary 170 (H) 65 - 99 mg/dL  Glucose, capillary     Status: Abnormal   Collection Time: 03/19/16  5:08 PM  Result Value Ref Range   Glucose-Capillary 134 (H) 65 - 99 mg/dL   Comment 1 Notify RN    Comment 2 Document in Chart   Glucose, capillary     Status: Abnormal   Collection Time: 03/19/16  8:03 PM  Result Value Ref Range   Glucose-Capillary 159 (H) 65 - 99 mg/dL   Comment 1 Notify RN    Comment 2 Document in Chart   Basic metabolic panel     Status: Abnormal   Collection Time: 03/20/16  7:11 AM  Result Value Ref Range   Sodium 137 135 - 145 mmol/L   Potassium 4.0 3.5 - 5.1 mmol/L   Chloride 101 101 - 111 mmol/L   CO2 28 22 - 32 mmol/L   Glucose, Bld 169 (H) 65 - 99 mg/dL   BUN 10 6 - 20 mg/dL   Creatinine, Ser 0.66 0.44 - 1.00 mg/dL   Calcium 8.5 (L) 8.9 - 10.3 mg/dL   GFR calc non Af Amer >60 >60 mL/min   GFR calc Af Amer >  60 >60 mL/min    Comment: (NOTE) The eGFR has been calculated using the CKD EPI equation. This  calculation has not been validated in all clinical situations. eGFR's persistently <60 mL/min signify possible Chronic Kidney Disease.    Anion gap 8 5 - 15  Glucose, capillary     Status: Abnormal   Collection Time: 03/20/16  7:31 AM  Result Value Ref Range   Glucose-Capillary 170 (H) 65 - 99 mg/dL   Comment 1 Notify RN   Glucose, capillary     Status: Abnormal   Collection Time: 03/20/16 11:17 AM  Result Value Ref Range   Glucose-Capillary 161 (H) 65 - 99 mg/dL   Comment 1 Notify RN   Glucose, capillary     Status: Abnormal   Collection Time: 03/20/16  4:57 PM  Result Value Ref Range   Glucose-Capillary 231 (H) 65 - 99 mg/dL   Comment 1 Notify RN     Lipid Panel  Recent Labs  03/19/16 0530  CHOL 204*  TRIG 472*  HDL 29*  CHOLHDL 7.0  VLDL UNABLE TO CALCULATE IF TRIGLYCERIDE OVER 400 mg/dL  LDLCALC UNABLE TO CALCULATE IF TRIGLYCERIDE OVER 400 mg/dL    Studies/Results:   Medications:  Scheduled Meds: . cefTRIAXone (ROCEPHIN)  IV  1 g Intravenous Q24H  . clopidogrel  75 mg Oral Daily  . enoxaparin (LOVENOX) injection  40 mg Subcutaneous Q24H  . hydrocerin   Topical Daily  . insulin aspart  0-9 Units Subcutaneous TID WC  . insulin aspart protamine- aspart  20 Units Subcutaneous TID WC  . levothyroxine  25 mcg Oral QAC breakfast  . lisinopril  10 mg Oral q morning - 10a  . pravastatin  80 mg Oral QPM   Continuous Infusions:  PRN Meds:.acetaminophen, albuterol, diazepam, LORazepam, nitroGLYCERIN, ondansetron (ZOFRAN) IV, oxyCODONE-acetaminophen, senna-docusate, zolpidem     LOS: 2 days   Mahogony Gilchrest A. Merlene Laughter, M.D.  Diplomate, Tax adviser of Psychiatry and Neurology ( Neurology).

## 2016-03-20 NOTE — Progress Notes (Signed)
Patient has pravastatin increased from 40-80 mg per day currently on full dose aspirin 325 as well as Plavix and B-12 given subcutaneous thousand milligrams today appreciate neurology expertise 2-D echo reveals EF of 60-65% normal LAD chamber dimensions carotid ultrasounds revealed no critical stenosis [name] J Vanmetre WUJ:[id number] DOB: 03-25-1936 DOA: 03/18/2016 PCP: [name],Elidia Bonenfant M, MD             Physical Exam: Blood pressure 126/50, pulse 84, temperature 99.8 F (37.7 C), temperature source Oral, resp. rate 20, height 5\' 4"  (1.626 m), weight 209 lb 11.2 oz (95.119 kg), SpO2 92 %. Lungs clear to A&P no rales wheeze rhonchi heart regular rhythm no murmurs goes heaves thrills rubs abdomen soft nontender bowel sounds normoactive cranial nerves grossly intact patient will 4 extremities minus downgoing   Investigations:  Recent Results (from the past 240 hour(s))  Urine culture     Status: None (Preliminary result)   Collection Time: 03/18/16  8:20 AM  Result Value Ref Range Status   Specimen Description URINE, CATHETERIZED  Final   Special Requests NONE  Final   Culture   Final    >=100,000 COLONIES/mL GRAM NEGATIVE RODS CULTURE REINCUBATED FOR BETTER GROWTH [facility]    Report Status PENDING  Incomplete  Culture, blood (Routine X 2) w Reflex to ID Panel     Status: None (Preliminary result)   Collection Time: 03/18/16  8:29 PM  Result Value Ref Range Status   Specimen Description BLOOD RIGHT ANTECUBITAL  Final   Special Requests BOTTLES DRAWN AEROBIC AND ANAEROBIC 6CC  Final   Culture NO GROWTH 2 DAYS  Final   Report Status PENDING  Incomplete  Culture, blood (Routine X 2) w Reflex to ID Panel     Status: None (Preliminary result)   Collection Time: 03/18/16  8:40 PM  Result Value Ref Range Status   Specimen Description BLOOD RIGHT HAND  Final   Special Requests BOTTLES DRAWN AEROBIC AND ANAEROBIC 6CC  Final   Culture NO GROWTH 2 DAYS  Final    Report Status PENDING  Incomplete     Basic Metabolic Panel:  Recent Labs  95/62/1303/27/17 0715 03/18/16 0722 03/20/16 0711  NA 139 141 137  K 4.2 4.2 4.0  CL 104 101 101  CO2 27  --  28  GLUCOSE 214* 206* 169*  BUN 8 6 10   CREATININE 0.68 0.60 0.66  CALCIUM 8.9  --  8.5*   Liver Function Tests:  Recent Labs  03/18/16 0715  AST 19  ALT 14  ALKPHOS 84  BILITOT 0.3  PROT 7.0  ALBUMIN 3.8     CBC:  Recent Labs  03/18/16 0715 03/18/16 0722  WBC 9.1  --   NEUTROABS 3.8  --   HGB 10.6* 13.3  HCT 37.1 39.0  MCV 69.2*  --   PLT 283  --     Mr Maxine GlennMra Head Wo Contrast  03/18/2016  CLINICAL DATA:  Altered mental status and weakness for 1 day. EXAM: MRI HEAD WITHOUT CONTRAST MRA HEAD WITHOUT CONTRAST TECHNIQUE: Multiplanar, multiecho pulse sequences of the brain and surrounding structures were obtained without intravenous contrast. Angiographic images of the head were obtained using MRA technique without contrast. COMPARISON:  Head CT earlier today. Head MRI 08/29/2015. Head MRA 06/22/2012. FINDINGS: MRI HEAD FINDINGS Examination is mildly motion degraded. There is no evidence of acute infarct, intracranial hemorrhage, mass, midline shift, or extra-axial fluid collection. There is mild generalized cerebral atrophy. Patchy T2 hyperintensities involving  the periventricular greater than subcortical cerebral white matter are similar to the prior MRI and nonspecific but compatible with moderate chronic small vessel ischemic disease. A chronic right thalamic lacunar infarct is unchanged. Prior bilateral cataract extraction is noted. There is mild left frontal and left ethmoid sinus mucosal thickening, and there are trace right and small left mastoid effusions. Major intracranial vascular flow voids are preserved. MRA HEAD FINDINGS The visualized distal vertebral arteries are patent and codominant without significant stenosis. Basilar artery is patent without stenosis. Right PICA origin is patent.  There may be a severe right AICA origin stenosis. Right SCA is grossly patent with possible origin stenosis. Left SCA is not well evaluated. There is a fetal type origin of the right PCA. The left PCA demonstrates moderate P3 narrowing without significant proximal stenosis. The right PCA appears attenuated with areas of apparent moderate to severe narrowing involving the P2 segment as well as more distal branch vessels. Right PCA attenuation and P2 narrowing appears increased compared to the prior MRA. The internal carotid arteries are patent from skullbase to carotid termini without significant stenosis identified on the right. There may be mild narrowing of the left ICA at the level of the anterior genu. M1 segments are patent without evidence of significant stenosis. There is likely a moderate to severe stenosis involving the proximal M2 inferior division on the right. Mild MCA branch vessel irregularity is seen more distally bilaterally. The right A1 segment is again noted to be hypoplastic and irregularly narrowed. Left A1 segment is widely patent. Anterior communicating artery is patent. A2 segments are patent without evidence of significant stenosis. No definite intracranial aneurysm is identified, with minimal prominence of the anterior communicating artery similar to the prior study. IMPRESSION: 1. No acute intracranial abnormality. 2. Moderate chronic small vessel ischemic disease. 3. Severe right P2 PCA narrowing, increased from prior. 4. Mild left ICA stenosis. Moderate to severe right M2 MCA stenosis. Electronically Signed   By: Sebastian Ache M.D.   On: 03/18/2016 21:46   [name] Contrast  03/18/2016  CLINICAL DATA:  Altered mental status and weakness for 1 day. EXAM: MRI HEAD WITHOUT CONTRAST MRA HEAD WITHOUT CONTRAST TECHNIQUE: Multiplanar, multiecho pulse sequences of the brain and surrounding structures were obtained without intravenous contrast. Angiographic images of the head were obtained  using MRA technique without contrast. COMPARISON:  Head CT earlier today. Head MRI 08/29/2015. Head MRA 06/22/2012. FINDINGS: MRI HEAD FINDINGS Examination is mildly motion degraded. There is no evidence of acute infarct, intracranial hemorrhage, mass, midline shift, or extra-axial fluid collection. There is mild generalized cerebral atrophy. Patchy T2 hyperintensities involving the periventricular greater than subcortical cerebral white matter are similar to the prior MRI and nonspecific but compatible with moderate chronic small vessel ischemic disease. A chronic right thalamic lacunar infarct is unchanged. Prior bilateral cataract extraction is noted. There is mild left frontal and left ethmoid sinus mucosal thickening, and there are trace right and small left mastoid effusions. Major intracranial vascular flow voids are preserved. MRA HEAD FINDINGS The visualized distal vertebral arteries are patent and codominant without significant stenosis. Basilar artery is patent without stenosis. Right PICA origin is patent. There may be a severe right AICA origin stenosis. Right SCA is grossly patent with possible origin stenosis. Left SCA is not well evaluated. There is a fetal type origin of the right PCA. The left PCA demonstrates moderate P3 narrowing without significant proximal stenosis. The right PCA appears attenuated with areas of apparent moderate to  severe narrowing involving the P2 segment as well as more distal branch vessels. Right PCA attenuation and P2 narrowing appears increased compared to the prior MRA. The internal carotid arteries are patent from skullbase to carotid termini without significant stenosis identified on the right. There may be mild narrowing of the left ICA at the level of the anterior genu. M1 segments are patent without evidence of significant stenosis. There is likely a moderate to severe stenosis involving the proximal M2 inferior division on the right. Mild MCA branch vessel  irregularity is seen more distally bilaterally. The right A1 segment is again noted to be hypoplastic and irregularly narrowed. Left A1 segment is widely patent. Anterior communicating artery is patent. A2 segments are patent without evidence of significant stenosis. No definite intracranial aneurysm is identified, with minimal prominence of the anterior communicating artery similar to the prior study. IMPRESSION: 1. No acute intracranial abnormality. 2. Moderate chronic small vessel ischemic disease. 3. Severe right P2 PCA narrowing, increased from prior. 4. Mild left ICA stenosis. Moderate to severe right M2 MCA stenosis. Electronically Signed   By: Sebastian Ache M.D.   On: 03/18/2016 21:46   US Carotid Bilateral  03/19/2016  CLINICAL DATA:  CVA. EXAM: BILATERAL CAROTID DUPLEX ULTRASOUND TECHNIQUE: Wallace Cullens scale imaging, color Doppler and duplex ultrasound were performed of bilateral carotid and vertebral arteries in the neck. COMPARISON:  MRI 03/27/ 2017.  CT 03/18/2016. FINDINGS: Criteria: Quantification of carotid stenosis is based on velocity parameters that correlate the residual internal carotid diameter with NASCET-based stenosis levels, using the diameter of the distal internal carotid lumen as the denominator for stenosis measurement. The following velocity measurements were obtained: RIGHT ICA:  127/22 cm/sec CCA:  180/29 cm/sec SYSTOLIC ICA/CCA RATIO:  0.8 DIASTOLIC ICA/CCA RATIO:  0.9 ECA:  172 cm/sec LEFT ICA:  137/43 cm/sec CCA:  139/24 cm/sec SYSTOLIC ICA/CCA RATIO:  1.0 DIASTOLIC ICA/CCA RATIO:  1.8 ECA:  194 cm/sec RIGHT CAROTID ARTERY: Mild right carotid bifurcation atherosclerotic vascular disease. No flow limiting stenosis. No evidence of prominent spectral broadening. RIGHT VERTEBRAL ARTERY:  Not visualized. LEFT CAROTID ARTERY: Mild left carotid bifurcation atherosclerotic vascular disease. No flow limiting stenosis. No evidence of prominent spectral broadening . LEFT VERTEBRAL ARTERY:  Not  visualized. IMPRESSION: 1. Mild bilateral carotid bifurcation atherosclerotic vascular disease. No flow limiting stenosis. Degree of stenosis less than 50%. 2. Vertebral arteries are not visualized. Electronically Signed   By: Maisie Fus  Register   On: 03/19/2016 11:56      Medications:  Impression:  Active Problems:   Hypothyroidism   DM type 2 causing complication (HCC)   Hyperlipidemia   Obesity   Essential hypertension   UTI (lower urinary tract infection)   Dysarthria     Plan: Continue Rocephin for UTI increase Pravachol from 40-80 mg. Continue full dose aspirin 325 mg. 2 Plavix 75 mg. B-12 supplementation thousand milligrams subcutaneous today  Consultants: Neurology   Procedures await EEG  Antibiotics: Rocephin                  Code Status: Full   Family Communication:    Disposition Plan see plan above  Time spent: 30 minutes   LOS: 2 days   Theseus Birnie M   03/20/2016, 12:10 PM

## 2016-03-21 LAB — BASIC METABOLIC PANEL
Anion gap: 9 (ref 5–15)
BUN: 11 mg/dL (ref 6–20)
CALCIUM: 8.6 mg/dL — AB (ref 8.9–10.3)
CHLORIDE: 102 mmol/L (ref 101–111)
CO2: 27 mmol/L (ref 22–32)
CREATININE: 0.55 mg/dL (ref 0.44–1.00)
GFR calc non Af Amer: >60 mL/min (ref >60)
GLUCOSE: 225 mg/dL — AB (ref 65–99)
Potassium: 4 mmol/L (ref 3.5–5.1)
Sodium: 138 mmol/L (ref 135–145)

## 2016-03-21 LAB — GLUCOSE, CAPILLARY
GLUCOSE-CAPILLARY: 192 mg/dL — AB (ref 65–99)
GLUCOSE-CAPILLARY: 225 mg/dL — AB (ref 65–99)
Glucose-Capillary: 205 mg/dL — ABNORMAL HIGH (ref 65–99)
Glucose-Capillary: 261 mg/dL — ABNORMAL HIGH (ref 65–99)

## 2016-03-21 LAB — URINE CULTURE

## 2016-03-21 NOTE — Plan of Care (Signed)
Problem: Acute Rehab PT Goals(only PT should resolve) Goal: Patient Will Transfer Sit To/From Stand Outcome: Completed/Met Date Met:  03/21/16 Min cueing for hand placement for safety Goal: Pt Will Ambulate Outcome: Completed/Met Date Met:  03/21/16 Ambulated with RW x 85 feet supervision  Comments:  Goals achieved during session 03/21/2016.

## 2016-03-21 NOTE — Clinical Social Work Note (Signed)
CSW met with patient to present bed offers from both [facility] and [facility].  Patient stated that she was not going.  She advised that she was going home.  CSW requested to call patient's husband to discuss patient's decision, however no one answered.    Azelyn Batie, Clydene Pugh, LCSW

## 2016-03-21 NOTE — Evaluation (Signed)
Occupational Therapy Evaluation Patient Details Name: [name] [name] MRN: [id number] DOB: 07/18/1936 Today's Date: 03/21/2016    History of Present Illness Patient is a 80 year old woman with multiple medical comorbidities including obesity, coronary artery disease, hypertension, hyperlipidemia, insulin-dependent diabetes mellitus, hypothyroidism who presented to the emergency department this morning with the above-mentioned complaints. Shortly after waking up she was noted to be confused and dysarthric. Code stroke was activated, after further questioning it was determined that her last seen normal was last night, because of this code stroke was cancel and TPA was not administered. CT scan of the head is negative for acute findings, admission has been requested for further investigations. She is also found to have a UTI.   Clinical Impression   Pt sitting up in chair on OT arrival, awake, alert, oriented x3. Pt reports she is feeling much better this morning. Pt able to answer all questions and follow simple one and two step commands. Pt with arthritis that limits her ability to complete all ADL tasks independently. Pt requires assistance with dressing on occasion, daughter comes to assist with bathing. Pt appears to be at baseline level of functioning, recommend HHOT evaluation to further assess pt functioning in the home environment including ADL tasks, functional mobility, and safety.     Follow Up Recommendations  Home health OT    Equipment Recommendations  None recommended by OT       Precautions / Restrictions Precautions Precautions: None Restrictions Weight Bearing Restrictions: No      Mobility  Transfers Overall transfer level: Independent   Transfers: Sit to/from Stand Sit to Stand: Modified independent (Device/Increase time)         General transfer comment: Pt able to perform sit to stand independently using BUE to push off chair         ADL Overall ADL's  : Needs assistance/impaired Eating/Feeding: Modified independent                   Lower Body Dressing: Moderate assistance;Sitting/lateral leans Lower Body Dressing Details (indicate cue type and reason): Pt requires assistance to donn and doff socks & shoes due to difficulty reaching. Pt wears bedroom slippers in the home                     Vision Vision Assessment?: No apparent visual deficits          Pertinent Vitals/Pain Pain Assessment: No/denies pain     Hand Dominance Right   Extremity/Trunk Assessment Upper Extremity Assessment Upper Extremity Assessment: Generalized weakness   Lower Extremity Assessment Lower Extremity Assessment: Defer to PT evaluation       Communication Communication Communication: No difficulties   Cognition Arousal/Alertness: Awake/alert Behavior During Therapy: WFL for tasks assessed/performed Overall Cognitive Status: Within Functional Limits for tasks assessed                                Home Living Family/patient expects to be discharged to:: Private residence Living Arrangements: Spouse/significant other;Children Available Help at Discharge: Family;Available 24 hours/day               Bathroom Shower/Tub: Chief Strategy Officer: Standard     Home Equipment: Cane - single point;Walker - 4 wheels;Grab bars - tub/shower;Shower seat          Prior Functioning/Environment Level of Independence: Independent with assistive device(s)        Comments:  Pt uses SPC when out in the community, uses shower seat when bathing    OT Diagnosis: Generalized weakness   OT Problem List: Decreased strength;Decreased activity tolerance    End of Session    Activity Tolerance: Patient tolerated treatment well Patient left: in chair;with call bell/phone within reach   Time: [id number] OT Time Calculation (min): 16 min Charges:  OT General Charges $OT Visit: 1 Procedure OT  Evaluation $OT Eval Low Complexity: 1 Procedure  Ezra SitesLeslie Troxler, OTR/L  [phone]  03/21/2016, 9:22 AM

## 2016-03-21 NOTE — Clinical Documentation Improvement (Signed)
Internal Medicine Neurology  Please specify the type of Encephalopathy your patient has and document findings in next progress note. Thank you!   Metabolic Encephalopathy secondary to UTI  Encephalopathy due to possible Seizure  Other Type of Encephalopathy  Clinically Undetermined  Supporting Information:  Please exercise your independent, professional judgment when responding. A specific answer is not anticipated or expected.  Thank You, Shellee MiloEileen T Aldridge Krzyzanowski RN, BSN, CCDS Health Information Management [place] [phone] ; Cell: [phone]

## 2016-03-21 NOTE — Progress Notes (Signed)
Physical Therapy Treatment Patient Details Name: [name] AuerRuby J [name] MRN: [id number] DOB: 1936/03/16 Today's Date: 03/21/2016    History of Present Illness Patient is a 80 year old woman with multiple medical comorbidities including obesity, coronary artery disease, hypertension, hyperlipidemia, insulin-dependent diabetes mellitus, hypothyroidism who presented to the emergency department this morning with the above-mentioned complaints. Shortly after waking up she was noted to be confused and dysarthric. Code stroke was activated, after further questioning it was determined that her last seen normal was last night, because of this code stroke was cancel and TPA was not administered. CT scan of the head is negative for acute findings, admission has been requested for further investigations. She is also found to have a UTI.    PT Comments    Pt making great gains towards goals.  Pt able to independently complete bed mobility supine to sit, able to sit to stand with min verbal cueing for handplacement, gait training with RW x 85 feet with supervision.  End of session pt requested to sit in chair.  RN informed and requested return to bed due to inability to find chair alarm pads.  Upon request to return to bed pt refused.  Pt informed awareness of safety and risks due to high fall risk but would not return to bed.  RN informed, sitter in room.  No reports of pain through session.   Follow Up Recommendations        Equipment Recommendations       Recommendations for Other Services       Precautions / Restrictions Precautions Precautions: Fall Restrictions Weight Bearing Restrictions: No    Mobility  Bed Mobility Overal bed mobility: Independent                Transfers Overall transfer level: Independent Equipment used: Rolling walker (2 wheeled) Transfers: Sit to/from Stand Sit to Stand: Supervision         General transfer comment: Pt able to perform sit to stand  independently using BUE to push off chair  Ambulation/Gait Ambulation/Gait assistance: Supervision Ambulation Distance (Feet): 85 Feet Assistive device: Rolling walker (2 wheeled) Gait Pattern/deviations: WFL(Within Functional Limits)   Gait velocity interpretation: Below normal speed for age/gender     Stairs            Wheelchair Mobility    Modified Rankin (Stroke Patients Only)       Balance Overall balance assessment: Independent Sitting-balance support: No upper extremity supported;Feet supported Sitting balance-Leahy Scale: Good     Standing balance support: No upper extremity supported Standing balance-Leahy Scale: Good Standing balance comment: able to stand no UE A for 60"+, NBOS with minimal weight shifting able to hold for 30"                    Cognition Arousal/Alertness: Awake/alert Behavior During Therapy: WFL for tasks assessed/performed Overall Cognitive Status: Within Functional Limits for tasks assessed Area of Impairment: Following commands       Following Commands: Follows one step commands with increased time;Follows multi-step commands inconsistently       General Comments: Pt reporting she is having trouble making her body do that she wants it to    Exercises Total Joint Exercises Ankle Circles/Pumps: AROM;Both;10 reps;Seated Hip ABduction/ADduction: AROM;Both;10 reps;Seated Long Arc Quad: AROM;Both;10 reps;Seated    General Comments        Pertinent Vitals/Pain Pain Assessment: No/denies pain    Home Living Family/patient expects to be discharged to:: Private residence Living Arrangements: Spouse/significant other;Children  Available Help at Discharge: Family;Available 24 hours/day         Home Equipment: Cane - single point;Walker - 4 wheels;Grab bars - tub/shower;Shower seat      Prior Function Level of Independence: Independent with assistive device(s)      Comments: Pt uses SPC when out in the community,  uses shower seat when bathing   PT Goals (current goals can now be found in the care plan section) Progress towards PT goals: Progressing toward goals    Frequency       PT Plan Current plan remains appropriate    Co-evaluation             End of Session Equipment Utilized During Treatment: Gait belt Activity Tolerance: Patient tolerated treatment well Patient left: in chair;with call bell/phone within reach;with nursing/sitter in room     Time: 1610-9604 PT Time Calculation (min) (ACUTE ONLY): 17 min  Charges:  $Gait Training: 8-22 mins $Therapeutic Exercise: 8-22 mins                    G Codes:     Becky Sax, [name] [name]; [name] [name] [phone]  Juel Burrow 03/21/2016, 11:03 AM

## 2016-03-21 NOTE — Progress Notes (Signed)
Awaiting results of EEG to determine any antiseizure medicine that may or may not be necessary patient alert and oriented no focal neurologic deficits noted [name] [name] NFA:[id number] DOB: [birth year] DOA: 03/18/2016 PCP: IsaCrawford Givensbella StallingNDIEGO,[name], MD             Physical Exam: Blood pressure 132/82, pulse 83, temperature 98.4 F (36.9 C), temperature source Oral, resp. rate 18, height 5\' 4"  (1.626 m), weight 209 lb 11.2 oz (95.119 kg), SpO2 97 %. Lungs clear to A&P diminished breath sounds in bases no rales wheeze or rhonchi appreciable heart regular rhythm no murmurs goes heaves thrills rubs abdomen soft nontender bowel sounds normoactive neurologic sensorimotor 5 out of 5 all extremities plantars downgoing   Investigations:  Recent Results (from the past 240 hour(s))  Urine culture     Status: None   Collection Time: 03/18/16  8:20 AM  Result Value Ref Range Status   Specimen Description URINE, CATHETERIZED  Final   Special Requests NONE  Final   Culture   Final    >=100,000 COLONIES/mL ESCHERICHIA COLI [facility]    Report Status 03/21/2016 FINAL  Final   Organism ID, Bacteria ESCHERICHIA COLI  Final      Susceptibility   Escherichia coli - MIC*    AMPICILLIN <=2 SENSITIVE Sensitive     CEFAZOLIN <=4 SENSITIVE Sensitive     CEFTRIAXONE <=1 SENSITIVE Sensitive     CIPROFLOXACIN <=0.25 SENSITIVE Sensitive     GENTAMICIN <=1 SENSITIVE Sensitive     IMIPENEM <=0.25 SENSITIVE Sensitive     NITROFURANTOIN <=16 SENSITIVE Sensitive     TRIMETH/SULFA <=20 SENSITIVE Sensitive     AMPICILLIN/SULBACTAM <=2 SENSITIVE Sensitive     PIP/TAZO <=4 SENSITIVE Sensitive     * >=100,000 COLONIES/mL ESCHERICHIA COLI  Culture, blood (Routine X 2) w Reflex to ID Panel     Status: None (Preliminary result)   Collection Time: 03/18/16  8:29 PM  Result Value Ref Range Status   Specimen Description BLOOD RIGHT ANTECUBITAL  Final   Special Requests BOTTLES DRAWN AEROBIC AND  ANAEROBIC 6CC  Final   Culture NO GROWTH 3 DAYS  Final   Report Status PENDING  Incomplete  Culture, blood (Routine X 2) w Reflex to ID Panel     Status: None (Preliminary result)   Collection Time: 03/18/16  8:40 PM  Result Value Ref Range Status   Specimen Description BLOOD RIGHT HAND  Final   Special Requests BOTTLES DRAWN AEROBIC AND ANAEROBIC 6CC  Final   Culture NO GROWTH 3 DAYS  Final   Report Status PENDING  Incomplete     Basic Metabolic Panel:  Recent Labs  46/96/2903/29/17 0711 03/21/16 0555  NA 137 138  K 4.0 4.0  CL 101 102  CO2 28 27  GLUCOSE 169* 225*  BUN 10 11  CREATININE 0.66 0.55  CALCIUM 8.5* 8.6*   Liver Function Tests: No results for input(s): AST, ALT, ALKPHOS, BILITOT, PROT, ALBUMIN in the last 72 hours.   CBC: No results for input(s): WBC, NEUTROABS, HGB, HCT, MCV, PLT in the last 72 hours.  No results found.    Medications:  Impression:  Active Problems:   Hypothyroidism   DM type 2 causing complication (HCC)   Hyperlipidemia   Obesity   Essential hypertension   UTI (lower urinary tract infection)   Dysarthria     Plan:Await results of EEG consider discharge in a.m.  Consultants: Neurology   Procedures EEG   Antibiotics:  Code Status: Full   Family Communication:    Disposition Plan see plan above  Time spent: 30 minutes   LOS: 3 days   [name]   03/21/2016, 12:54 PM

## 2016-03-21 NOTE — Progress Notes (Signed)
Inpatient Diabetes Program Recommendations  AACE/ADA: New Consensus Statement on Inpatient Glycemic Control (2015)  Target Ranges:  Prepandial:   less than 140 mg/dL      Peak postprandial:   less than 180 mg/dL (1-2 hours)      Critically ill patients:  140 - 180 mg/dL   Results for [name] [name], [name] [name] (MRN [id number]) as of 03/21/2016 16:06  Ref. Range 03/20/2016 11:17 03/20/2016 16:57 03/20/2016 20:37 03/21/2016 07:46 03/21/2016 11:53  Glucose-Capillary Latest Ref Range: 65-99 mg/dL 865161 (H) 784231 (H) 696244 (H) 205 (H) 261 (H)  03/21/16 Patient states that at home she takes [place]iba 70 units daily and 70/30, 20 units tid before meals.  Patient states that she checks bs in the morning and at bedtime. She reports  her bs run in the 200s.  Hospital DM meds:  70/30  20 units tid with meals and sensitive correction scale tid with meals.  Please consider adding some long acting basal insulin.  [name] [name] RD, CDE. M.Ed. Pager [phone] Inpatient Diabetes Coordinator

## 2016-03-22 LAB — BASIC METABOLIC PANEL
Anion gap: 9 (ref 5–15)
BUN: 13 mg/dL (ref 6–20)
CO2: 28 mmol/L (ref 22–32)
CREATININE: 0.6 mg/dL (ref 0.44–1.00)
Calcium: 9 mg/dL (ref 8.9–10.3)
Chloride: 102 mmol/L (ref 101–111)
GFR calc Af Amer: >60 mL/min (ref >60)
GLUCOSE: 278 mg/dL — AB (ref 65–99)
POTASSIUM: 4.3 mmol/L (ref 3.5–5.1)
SODIUM: 139 mmol/L (ref 135–145)

## 2016-03-22 LAB — GLUCOSE, CAPILLARY
Glucose-Capillary: 248 mg/dL — ABNORMAL HIGH (ref 65–99)
Glucose-Capillary: 269 mg/dL — ABNORMAL HIGH (ref 65–99)

## 2016-03-22 MED ORDER — PRAVASTATIN SODIUM 80 MG PO TABS: 80.0000 mg | ORAL_TABLET | Freq: Every evening | ORAL | Status: DC

## 2016-03-22 NOTE — Care Management Note (Deleted)
Case Management Note  Patient Details  Name: Lise AuerRuby J Tomer MRN: [id number] Date of Birth: 10/21/1936  Subjective/Objective:                    Action/Plan:   Expected Discharge Date:                  Expected Discharge Plan:  Home/Self Care  In-House Referral:     Discharge planning Services  CM Consult  Post Acute Care Choice:    Choice offered to:     DME Arranged:    DME Agency:     HH Arranged:  RN, PT HH Agency:     Status of Service:  Completed, signed off  Medicare Important Message Given:  Yes Date Medicare IM Given:    Medicare IM give by:    Date Additional Medicare IM Given:    Additional Medicare Important Message give by:     If discussed at Long Length of Stay Meetings, dates discussed:    Additional Comments:  Adonis HugueninBerkhead, Amiria Orrison L, RN 03/22/2016, 1:44 PM

## 2016-03-22 NOTE — Discharge Summary (Signed)
Physician Discharge Summary  [name] [name] ZOX:[id number] DOB: 28-Mar-1936 DOA: 03/18/2016  PCP: [name], MD  Admit date: 03/18/2016 Discharge date: 03/22/2016   Recommendations for Outpatient Follow-up:  Patient is recommended to increase her Pravachol to take Full aspirin once a day and to take all her previous hospitalization admission meds as prescribed. one week's time to check neurologic symptoms in the resolution as well as hemodynamics and glycemic control and lipids Discharge Diagnoses:  Active Problems:   Hypothyroidism   DM type 2 causing complication (HCC)   Hyperlipidemia   Obesity   Essential hypertension   UTI (lower urinary tract infection)   Dysarthria   Discharge Condition: Good  Filed Weights   03/18/16 1208 03/19/16 0525 03/20/16 0542  Weight: 205 lb 9.6 oz (93.26 kg) 204 lb 6.4 oz (92.715 kg) 209 lb 11.2 oz (95.119 kg)    History of present illness:  Patient was admitted with some dysarthria right facial droop mild disorientation in the face of UTI insulin diabetes hypertension hyperlipidemia CT scan on admission was negative for acute CVA subsequent MRI revealed no evidence of CVA which was acute MRA MRI revealed some small vessel disease she was seen in consultation by neurology who will consider possibility of ovarian seizure EEG was done which was essentially normal with no evidence of seizure activity she was subsequently discharged on her previous hospital medicines and insulin with deep exception of Pravachol being increased to 80 mg per day she'll follow-up in the office in one week's time  Hospital Course:    Procedures:  MRA MRI  Consultations:  Neurology  Discharge Instructions  Discharge Instructions    Discharge instructions    Complete by:  As directed      Discharge patient    Complete by:  As directed             Medication List    STOP taking these medications        diazepam 5 MG tablet  Commonly known as:   VALIUM     minocycline 100 MG capsule  Commonly known as:  MINOCIN,DYNACIN     zolpidem 5 MG tablet  Commonly known as:  AMBIEN      TAKE these medications        albuterol (2.5 MG/3ML) 0.083% nebulizer solution  Commonly known as:  PROVENTIL  Take 3 mLs (2.5 mg total) by nebulization every 6 (six) hours as needed for wheezing or shortness of breath.     aspirin EC 325 MG tablet  Take 325 mg by mouth daily.     levothyroxine 25 MCG tablet  Commonly known as:  SYNTHROID, LEVOTHROID  Take 25 mcg by mouth every morning.     lisinopril 10 MG tablet  Commonly known as:  PRINIVIL,ZESTRIL  Take 1 tablet by mouth every morning.     metFORMIN 500 MG tablet  Commonly known as:  GLUCOPHAGE  Take 500 mg by mouth 2 (two) times daily with a meal.     nitroGLYCERIN 0.4 MG SL tablet  Commonly known as:  NITROSTAT  Place 0.4 mg under the tongue every 5 (five) minutes as needed for chest pain.     NOVOLIN 70/30 RELION (70-30) 100 UNIT/ML injection  Generic drug:  insulin NPH-regular Human  Inject 20 Units into the skin 3 (three) times daily before meals.     oxyCODONE-acetaminophen 7.5-325 MG tablet  Commonly known as:  PERCOCET  Take 1 tablet by mouth 4 (four) times daily as  needed for moderate pain.     pravastatin 80 MG tablet  Commonly known as:  PRAVACHOL  Take 1 tablet (80 mg total) by mouth every evening.     TRESIBA FLEXTOUCH 200 UNIT/ML Sopn  Generic drug:  Insulin Degludec  Inject 35-70 Units into the skin 2 (two) times daily. 35 units in the morning and 70 units in the evening       Allergies  Allergen Reactions  . Codeine Itching       Follow-up Information    Follow up with [facility] .   Specialty:  Skilled Nursing Facility   Contact information:   [address] [place] [address] [phone]       The results of significant diagnostics from this hospitalization (including imaging, microbiology, ancillary and  laboratory) are listed below for reference.    Significant Diagnostic Studies: Ct Head Wo Contrast  03/18/2016  CLINICAL DATA:  Altered mental status EXAM: CT HEAD WITHOUT CONTRAST TECHNIQUE: Contiguous axial images were obtained from the base of the skull through the vertex without intravenous contrast. COMPARISON:  December 19, 2015 FINDINGS: Mild diffuse atrophy is stable. There is no intracranial mass, hemorrhage, extra-axial fluid collection, or midline shift. There is patchy small vessel disease throughout the centra semiovale bilaterally, stable. There is no new gray-white compartment lesion. No acute infarct evident. The middle cerebral artery attenuation is symmetric and unchanged bilaterally. Bony calvarium appears intact. Mastoids on the right are clear. There is persistent opacification of several mastoid air cells on the left. There is mucosal thickening in several ethmoid air cells bilaterally. No intraorbital lesions are evident. IMPRESSION: Stable atrophy with patchy periventricular small vessel disease. No acute infarct evident. No new gray-white compartment lesion. No hemorrhage or mass effect. Stable left-sided mastoid disease as well as bilateral ethmoid sinus disease present. Critical Value/emergent results were called by telephone at the time of interpretation on 03/18/2016 at 7:44 am to Dr. [name] , who verbally acknowledged these results. Electronically Signed   By: Bretta Bang III M.D.   On: 03/18/2016 07:44   [name] Wo Contrast  03/18/2016  CLINICAL DATA:  Altered mental status and weakness for 1 day. EXAM: MRI HEAD WITHOUT CONTRAST MRA HEAD WITHOUT CONTRAST TECHNIQUE: Multiplanar, multiecho pulse sequences of the brain and surrounding structures were obtained without intravenous contrast. Angiographic images of the head were obtained using MRA technique without contrast. COMPARISON:  Head CT earlier today. Head MRI 08/29/2015. Head MRA 06/22/2012. FINDINGS: MRI  HEAD FINDINGS Examination is mildly motion degraded. There is no evidence of acute infarct, intracranial hemorrhage, mass, midline shift, or extra-axial fluid collection. There is mild generalized cerebral atrophy. Patchy T2 hyperintensities involving the periventricular greater than subcortical cerebral white matter are similar to the prior MRI and nonspecific but compatible with moderate chronic small vessel ischemic disease. A chronic right thalamic lacunar infarct is unchanged. Prior bilateral cataract extraction is noted. There is mild left frontal and left ethmoid sinus mucosal thickening, and there are trace right and small left mastoid effusions. Major intracranial vascular flow voids are preserved. MRA HEAD FINDINGS The visualized distal vertebral arteries are patent and codominant without significant stenosis. Basilar artery is patent without stenosis. Right PICA origin is patent. There may be a severe right AICA origin stenosis. Right SCA is grossly patent with possible origin stenosis. Left SCA is not well evaluated. There is a fetal type origin of the right PCA. The left PCA demonstrates moderate P3 narrowing without  significant proximal stenosis. The right PCA appears attenuated with areas of apparent moderate to severe narrowing involving the P2 segment as well as more distal branch vessels. Right PCA attenuation and P2 narrowing appears increased compared to the prior MRA. The internal carotid arteries are patent from skullbase to carotid termini without significant stenosis identified on the right. There may be mild narrowing of the left ICA at the level of the anterior genu. M1 segments are patent without evidence of significant stenosis. There is likely a moderate to severe stenosis involving the proximal M2 inferior division on the right. Mild MCA branch vessel irregularity is seen more distally bilaterally. The right A1 segment is again noted to be hypoplastic and irregularly narrowed. Left A1  segment is widely patent. Anterior communicating artery is patent. A2 segments are patent without evidence of significant stenosis. No definite intracranial aneurysm is identified, with minimal prominence of the anterior communicating artery similar to the prior study. IMPRESSION: 1. No acute intracranial abnormality. 2. Moderate chronic small vessel ischemic disease. 3. Severe right P2 PCA narrowing, increased from prior. 4. Mild left ICA stenosis. Moderate to severe right M2 MCA stenosis. Electronically Signed   By: Sebastian Ache M.D.   On: 03/18/2016 21:46   [name] Contrast  03/18/2016  CLINICAL DATA:  Altered mental status and weakness for 1 day. EXAM: MRI HEAD WITHOUT CONTRAST MRA HEAD WITHOUT CONTRAST TECHNIQUE: Multiplanar, multiecho pulse sequences of the brain and surrounding structures were obtained without intravenous contrast. Angiographic images of the head were obtained using MRA technique without contrast. COMPARISON:  Head CT earlier today. Head MRI 08/29/2015. Head MRA 06/22/2012. FINDINGS: MRI HEAD FINDINGS Examination is mildly motion degraded. There is no evidence of acute infarct, intracranial hemorrhage, mass, midline shift, or extra-axial fluid collection. There is mild generalized cerebral atrophy. Patchy T2 hyperintensities involving the periventricular greater than subcortical cerebral white matter are similar to the prior MRI and nonspecific but compatible with moderate chronic small vessel ischemic disease. A chronic right thalamic lacunar infarct is unchanged. Prior bilateral cataract extraction is noted. There is mild left frontal and left ethmoid sinus mucosal thickening, and there are trace right and small left mastoid effusions. Major intracranial vascular flow voids are preserved. MRA HEAD FINDINGS The visualized distal vertebral arteries are patent and codominant without significant stenosis. Basilar artery is patent without stenosis. Right PICA origin is patent. There may  be a severe right AICA origin stenosis. Right SCA is grossly patent with possible origin stenosis. Left SCA is not well evaluated. There is a fetal type origin of the right PCA. The left PCA demonstrates moderate P3 narrowing without significant proximal stenosis. The right PCA appears attenuated with areas of apparent moderate to severe narrowing involving the P2 segment as well as more distal branch vessels. Right PCA attenuation and P2 narrowing appears increased compared to the prior MRA. The internal carotid arteries are patent from skullbase to carotid termini without significant stenosis identified on the right. There may be mild narrowing of the left ICA at the level of the anterior genu. M1 segments are patent without evidence of significant stenosis. There is likely a moderate to severe stenosis involving the proximal M2 inferior division on the right. Mild MCA branch vessel irregularity is seen more distally bilaterally. The right A1 segment is again noted to be hypoplastic and irregularly narrowed. Left A1 segment is widely patent. Anterior communicating artery is patent. A2 segments are patent without evidence of significant stenosis. No definite intracranial aneurysm is identified,  with minimal prominence of the anterior communicating artery similar to the prior study. IMPRESSION: 1. No acute intracranial abnormality. 2. Moderate chronic small vessel ischemic disease. 3. Severe right P2 PCA narrowing, increased from prior. 4. Mild left ICA stenosis. Moderate to severe right M2 MCA stenosis. Electronically Signed   By: Sebastian Ache M.D.   On: 03/18/2016 21:46   US Carotid Bilateral  03/19/2016  CLINICAL DATA:  CVA. EXAM: BILATERAL CAROTID DUPLEX ULTRASOUND TECHNIQUE: Wallace Cullens scale imaging, color Doppler and duplex ultrasound were performed of bilateral carotid and vertebral arteries in the neck. COMPARISON:  MRI 03/27/ 2017.  CT 03/18/2016. FINDINGS: Criteria: Quantification of carotid stenosis is based  on velocity parameters that correlate the residual internal carotid diameter with NASCET-based stenosis levels, using the diameter of the distal internal carotid lumen as the denominator for stenosis measurement. The following velocity measurements were obtained: RIGHT ICA:  127/22 cm/sec CCA:  180/29 cm/sec SYSTOLIC ICA/CCA RATIO:  0.8 DIASTOLIC ICA/CCA RATIO:  0.9 ECA:  172 cm/sec LEFT ICA:  137/43 cm/sec CCA:  139/24 cm/sec SYSTOLIC ICA/CCA RATIO:  1.0 DIASTOLIC ICA/CCA RATIO:  1.8 ECA:  194 cm/sec RIGHT CAROTID ARTERY: Mild right carotid bifurcation atherosclerotic vascular disease. No flow limiting stenosis. No evidence of prominent spectral broadening. RIGHT VERTEBRAL ARTERY:  Not visualized. LEFT CAROTID ARTERY: Mild left carotid bifurcation atherosclerotic vascular disease. No flow limiting stenosis. No evidence of prominent spectral broadening . LEFT VERTEBRAL ARTERY:  Not visualized. IMPRESSION: 1. Mild bilateral carotid bifurcation atherosclerotic vascular disease. No flow limiting stenosis. Degree of stenosis less than 50%. 2. Vertebral arteries are not visualized. Electronically Signed   By: Maisie Fus  Register   On: 03/19/2016 11:56   Dg Chest Portable 1 View  03/18/2016  CLINICAL DATA:  Confusion and slurred speech EXAM: PORTABLE CHEST 1 VIEW COMPARISON:  November 19, 2015 FINDINGS: There is no edema or consolidation. Heart is upper normal in size with pulmonary vascularity within normal limits. Patient is status post coronary artery bypass grafting. No adenopathy. No bone lesions evident. There is postoperative change in the right proximal humeral region. IMPRESSION: No edema or consolidation. Electronically Signed   By: Bretta Bang III M.D.   On: 03/18/2016 08:13    Microbiology: Recent Results (from the past 240 hour(s))  Urine culture     Status: None   Collection Time: 03/18/16  8:20 AM  Result Value Ref Range Status   Specimen Description URINE, CATHETERIZED  Final   Special  Requests NONE  Final   Culture   Final    >=100,000 COLONIES/mL ESCHERICHIA COLI Performed at [facility]    Report Status 03/21/2016 FINAL  Final   Organism ID, Bacteria ESCHERICHIA COLI  Final      Susceptibility   Escherichia coli - MIC*    AMPICILLIN <=2 SENSITIVE Sensitive     CEFAZOLIN <=4 SENSITIVE Sensitive     CEFTRIAXONE <=1 SENSITIVE Sensitive     CIPROFLOXACIN <=0.25 SENSITIVE Sensitive     GENTAMICIN <=1 SENSITIVE Sensitive     IMIPENEM <=0.25 SENSITIVE Sensitive     NITROFURANTOIN <=16 SENSITIVE Sensitive     TRIMETH/SULFA <=20 SENSITIVE Sensitive     AMPICILLIN/SULBACTAM <=2 SENSITIVE Sensitive     PIP/TAZO <=4 SENSITIVE Sensitive     * >=100,000 COLONIES/mL ESCHERICHIA COLI  Culture, blood (Routine X 2) w Reflex to ID Panel     Status: None (Preliminary result)   Collection Time: 03/18/16  8:29 PM  Result Value Ref Range Status   Specimen Description  BLOOD RIGHT ANTECUBITAL  Final   Special Requests BOTTLES DRAWN AEROBIC AND ANAEROBIC 6CC  Final   Culture NO GROWTH 4 DAYS  Final   Report Status PENDING  Incomplete  Culture, blood (Routine X 2) w Reflex to ID Panel     Status: None (Preliminary result)   Collection Time: 03/18/16  8:40 PM  Result Value Ref Range Status   Specimen Description BLOOD RIGHT HAND  Final   Special Requests BOTTLES DRAWN AEROBIC AND ANAEROBIC 6CC  Final   Culture NO GROWTH 4 DAYS  Final   Report Status PENDING  Incomplete  Culture, Urine     Status: None (Preliminary result)   Collection Time: 03/20/16  3:40 PM  Result Value Ref Range Status   Specimen Description URINE, CLEAN CATCH  Final   Special Requests NONE  Final   Culture   Final    TOO YOUNG TO READ [facility]    Report Status PENDING  Incomplete     Labs: Basic Metabolic Panel:  Recent Labs Lab 03/18/16 0715 03/18/16 0722 03/20/16 0711 03/21/16 0555 03/22/16 0604  NA 139 141 137 138 139  K 4.2 4.2 4.0 4.0 4.3  CL 104 101 101 102  102  CO2 27  --  28 27 28   GLUCOSE 214* 206* 169* 225* 278*  BUN 8 6 10 11 13   CREATININE 0.68 0.60 0.66 0.55 0.60  CALCIUM 8.9  --  8.5* 8.6* 9.0   Liver Function Tests:  Recent Labs Lab 03/18/16 0715  AST 19  ALT 14  ALKPHOS 84  BILITOT 0.3  PROT 7.0  ALBUMIN 3.8   No results for input(s): LIPASE, AMYLASE in the last 168 hours. No results for input(s): AMMONIA in the last 168 hours. CBC:  Recent Labs Lab 03/18/16 0715 03/18/16 0722  WBC 9.1  --   NEUTROABS 3.8  --   HGB 10.6* 13.3  HCT 37.1 39.0  MCV 69.2*  --   PLT 283  --    Cardiac Enzymes:  Recent Labs Lab 03/18/16 0715  TROPONINI <0.03   BNP: BNP (last 3 results) No results for input(s): BNP in the last 8760 hours.  ProBNP (last 3 results) No results for input(s): PROBNP in the last 8760 hours.  CBG:  Recent Labs Lab 03/21/16 1153 03/21/16 1648 03/21/16 2055 03/22/16 0735 03/22/16 1159  GLUCAP 261* 192* 225* 248* 269*       Signed:  Johsua Shevlin M  [facility] Pager: [phone] 03/22/2016, 12:33 PM

## 2016-03-22 NOTE — Care Management Important Message (Signed)
Important Message  Patient Details  Name: [name] [name] MRN: [id number] Date of Birth: [birth year]   Medicare Important Message Given:  Yes    Adonis HugueninBerkhead, [name], RN 03/22/2016, 8:35 AM

## 2016-03-22 NOTE — Progress Notes (Addendum)
Patient ID: [name] [name], female   DOB: [birth year], 80 y.o.   MRN: [id number]   [name] [place] A. Merlene Laughter, MD     [email or web address]          [name] [name] is an 80 y.o. female.   Assessment/Plan: Acute encephalopathy. The patient does have a urinary tract infection which is most likely etiology.  EEG fine. Will sign off.  Intracranial occlusive disease. This is asymptomatic.  Dyslipidemia.  Vitamin B12 deficiency.  Acute gait impairment likely also due to urinary tract infection.   EEG normal.     It appears the patient has improved significantly. She has no complaints.    ENERAL: Obese lady who is drowsy but in no acute distress. She seems to be moaning for unknown reasons.  HEENT: Supple. Atraumatic normocephalic.   ABDOMEN: soft  EXTREMITIES: No edema   BACK: Normal.  SKIN: Normal by inspection.   MENTAL STATUS: She is sleeping with easily arousable to verbal commands. She is oriented to place, month and year. She is more lucid and coherent today.  CRANIAL NERVES: Pupils are equal, round and reactive to light and accommodation; extra ocular movements are full, there is no significant nystagmus; visual fields limited but appears to be full; upper and lower facial muscles are normal in strength and symmetric, there is no flattening of the nasolabial folds; tongue is midline; uvula is midline; shoulder elevation is normal.  MOTOR: Exact strength limited due to impaired cognition issues but she has at least 4/5 strength throughout.  COORDINATION: Left finger to nose is normal, right finger to nose is normal, No rest tremor; no intention tremor; no postural tremor; no bradykinesia.   Labs the past 6 months revealed a low vitamin B12 level in the 150s twice. TSH 1.29.       Objective: Vital signs in last 24 hours: Temp:  [97.8 F (36.6 C)-97.9 F (36.6 C)] 97.8 F (36.6 C) (03/31 0630) Pulse Rate:  [69-90] 69 (03/31 0630) Resp:   [18] 18 (03/31 0630) BP: (131-142)/(54-90) 142/54 mmHg (03/31 0630) SpO2:  [93 %-100 %] 93 % (03/31 0630)  Intake/Output from previous day:   Intake/Output this shift:   Nutritional status: Diet Carb Modified Fluid consistency:: Thin; Room service appropriate?: Yes   Lab Results: Results for orders placed or performed during the hospital encounter of 03/18/16 (from the past 48 hour(s))  Glucose, capillary     Status: Abnormal   Collection Time: 03/20/16  4:57 PM  Result Value Ref Range   Glucose-Capillary 231 (H) 65 - 99 mg/dL   Comment 1 Notify RN   Glucose, capillary     Status: Abnormal   Collection Time: 03/20/16  8:37 PM  Result Value Ref Range   Glucose-Capillary 244 (H) 65 - 99 mg/dL  Basic metabolic panel     Status: Abnormal   Collection Time: 03/21/16  5:55 AM  Result Value Ref Range   Sodium 138 135 - 145 mmol/L   Potassium 4.0 3.5 - 5.1 mmol/L   Chloride 102 101 - 111 mmol/L   CO2 27 22 - 32 mmol/L   Glucose, Bld 225 (H) 65 - 99 mg/dL   BUN 11 6 - 20 mg/dL   Creatinine, Ser 0.55 0.44 - 1.00 mg/dL   Calcium 8.6 (L) 8.9 - 10.3 mg/dL   GFR calc non Af Amer >60 >60 mL/min   GFR calc Af Amer >60 >60 mL/min    Comment: (NOTE) The eGFR has been calculated using the  CKD EPI equation. This calculation has not been validated in all clinical situations. eGFR's persistently <60 mL/min signify possible Chronic Kidney Disease.    Anion gap 9 5 - 15  Glucose, capillary     Status: Abnormal   Collection Time: 03/21/16  7:46 AM  Result Value Ref Range   Glucose-Capillary 205 (H) 65 - 99 mg/dL   Comment 1 Notify RN   Glucose, capillary     Status: Abnormal   Collection Time: 03/21/16 11:53 AM  Result Value Ref Range   Glucose-Capillary 261 (H) 65 - 99 mg/dL   Comment 1 Notify RN   Glucose, capillary     Status: Abnormal   Collection Time: 03/21/16  4:48 PM  Result Value Ref Range   Glucose-Capillary 192 (H) 65 - 99 mg/dL   Comment 1 Notify RN    Comment 2 Document  in Chart   Glucose, capillary     Status: Abnormal   Collection Time: 03/21/16  8:55 PM  Result Value Ref Range   Glucose-Capillary 225 (H) 65 - 99 mg/dL  Basic metabolic panel     Status: Abnormal   Collection Time: 03/22/16  6:04 AM  Result Value Ref Range   Sodium 139 135 - 145 mmol/L   Potassium 4.3 3.5 - 5.1 mmol/L   Chloride 102 101 - 111 mmol/L   CO2 28 22 - 32 mmol/L   Glucose, Bld 278 (H) 65 - 99 mg/dL   BUN 13 6 - 20 mg/dL   Creatinine, Ser 0.60 0.44 - 1.00 mg/dL   Calcium 9.0 8.9 - 10.3 mg/dL   GFR calc non Af Amer >60 >60 mL/min   GFR calc Af Amer >60 >60 mL/min    Comment: (NOTE) The eGFR has been calculated using the CKD EPI equation. This calculation has not been validated in all clinical situations. eGFR's persistently <60 mL/min signify possible Chronic Kidney Disease.    Anion gap 9 5 - 15  Glucose, capillary     Status: Abnormal   Collection Time: 03/22/16  7:35 AM  Result Value Ref Range   Glucose-Capillary 248 (H) 65 - 99 mg/dL  Glucose, capillary     Status: Abnormal   Collection Time: 03/22/16 11:59 AM  Result Value Ref Range   Glucose-Capillary 269 (H) 65 - 99 mg/dL    Lipid Panel No results for input(s): CHOL, TRIG, HDL, CHOLHDL, VLDL, LDLCALC in the last 72 hours.  Studies/Results:   Medications:  Scheduled Meds: . cefTRIAXone (ROCEPHIN)  IV  1 g Intravenous Q24H  . clopidogrel  75 mg Oral Daily  . enoxaparin (LOVENOX) injection  40 mg Subcutaneous Q24H  . hydrocerin   Topical Daily  . insulin aspart  0-9 Units Subcutaneous TID WC  . insulin aspart protamine- aspart  20 Units Subcutaneous TID WC  . levothyroxine  25 mcg Oral QAC breakfast  . lisinopril  10 mg Oral q morning - 10a  . pravastatin  80 mg Oral QPM   Continuous Infusions:  PRN Meds:.acetaminophen, albuterol, diazepam, LORazepam, nitroGLYCERIN, ondansetron (ZOFRAN) IV, oxyCODONE-acetaminophen, senna-docusate, zolpidem     LOS: 4 days   Caydan Mctavish A. Merlene [name], M.D.  Diplomate,  Tax adviser of Psychiatry and Neurology ( Neurology).

## 2016-03-22 NOTE — Progress Notes (Signed)
Inpatient Diabetes Program Recommendations  AACE/ADA: New Consensus Statement on Inpatient Glycemic Control (2015)  Target Ranges:  Prepandial:   less than 140 mg/dL      Peak postprandial:   less than 180 mg/dL (1-2 hours)      Critically ill patients:  140 - 180 mg/dL  Results for Lise AuerMCMILLION, Kiyoko J (MRN [id number]) as of 03/22/2016 08:25  Ref. Range 03/21/2016 07:46 03/21/2016 11:53 03/21/2016 16:48 03/21/2016 20:55 03/22/2016 07:35  Glucose-Capillary Latest Ref Range: 65-99 mg/dL 409205 (H) 811261 (H) 914192 (H) 225 (H) 248 (H)   Review of Glycemic Control   Current orders for Inpatient glycemic control: 70/30 20 units TID with meals, Novolog 0-9 units TID with meals  Inpatient Diabetes Program Recommendations: Insulin - Basal: Please consider increasing 70/30 to 25 units TID with meals.  Thanks, Orlando PennerMarie Toa Mia, RN, MSN, CDE Diabetes Coordinator Inpatient Diabetes Program [phone] (Team Pager from 8am to 5pm) [phone] ([facility] office) [phone] [facility] office) [phone] [facility] office)

## 2016-03-22 NOTE — Progress Notes (Signed)
Pt discharged home with prescriptions and all personal belongings. Iv removed and intact.

## 2016-03-22 NOTE — Care Management Note (Signed)
Case Management Note  Patient Details  Name: [name] [name] MRN: [id number] Date of Birth: Oct 20, 1936  Subjective/Objective:     Spoke with patient who is from home with son and husband . Alert and oriented. She is independent . Walked 85 feet with PT . Patient doesn't meet for home bound requirement. Stated I go wherever I like.      Ambulating around the room with out the assistance of andy DME. Has a walker at home.           Action/Plan: Home with self care.  Expected Discharge Date:                  Expected Discharge Plan:  Home/Self Care  In-House Referral:     Discharge planning Services  CM Consult  Post Acute Care Choice:    Choice offered to:     DME Arranged:    DME Agency:     HH Arranged:  RN, PT HH Agency:     Status of Service:  Completed, signed off  Medicare Important Message Given:  Yes Date Medicare IM Given:    Medicare IM give by:    Date Additional Medicare IM Given:    Additional Medicare Important Message give by:     If discussed at Long Length of Stay Meetings, dates discussed:    Additional Comments:  Adonis HugueninBerkhead, Gyanna Jarema L, RN 03/22/2016, 1:41 PM

## 2016-03-23 LAB — URINE CULTURE

## 2016-03-23 LAB — CULTURE, BLOOD (ROUTINE X 2)
CULTURE: NO GROWTH
Culture: NO GROWTH

## 2016-04-04 ENCOUNTER — Emergency Department (HOSPITAL_COMMUNITY)
Admission: EM | Admit: 2016-04-04 | Discharge: 2016-04-04 | Disposition: A | Discharge status: Home / Self Care | Payer: Medicare HMO | Attending: Emergency Medicine | Admitting: Emergency Medicine

## 2016-04-04 ENCOUNTER — Emergency Department (HOSPITAL_COMMUNITY): Payer: Medicare HMO

## 2016-04-04 ENCOUNTER — Encounter (HOSPITAL_COMMUNITY): Payer: Self-pay

## 2016-04-04 DIAGNOSIS — M1990 Unspecified osteoarthritis, unspecified site: Secondary | ICD-10-CM | POA: Insufficient documentation

## 2016-04-04 DIAGNOSIS — Z8673 Personal history of transient ischemic attack (TIA), and cerebral infarction without residual deficits: Secondary | ICD-10-CM | POA: Diagnosis not present

## 2016-04-04 DIAGNOSIS — R531 Weakness: Secondary | ICD-10-CM | POA: Diagnosis not present

## 2016-04-04 DIAGNOSIS — J449 Chronic obstructive pulmonary disease, unspecified: Secondary | ICD-10-CM | POA: Insufficient documentation

## 2016-04-04 DIAGNOSIS — I25810 Atherosclerosis of coronary artery bypass graft(s) without angina pectoris: Secondary | ICD-10-CM | POA: Insufficient documentation

## 2016-04-04 DIAGNOSIS — I110 Hypertensive heart disease with heart failure: Secondary | ICD-10-CM | POA: Diagnosis not present

## 2016-04-04 DIAGNOSIS — I5030 Unspecified diastolic (congestive) heart failure: Secondary | ICD-10-CM | POA: Insufficient documentation

## 2016-04-04 DIAGNOSIS — Z7982 Long term (current) use of aspirin: Secondary | ICD-10-CM | POA: Diagnosis not present

## 2016-04-04 DIAGNOSIS — M25552 Pain in left hip: Secondary | ICD-10-CM | POA: Insufficient documentation

## 2016-04-04 DIAGNOSIS — E785 Hyperlipidemia, unspecified: Secondary | ICD-10-CM | POA: Insufficient documentation

## 2016-04-04 DIAGNOSIS — I252 Old myocardial infarction: Secondary | ICD-10-CM | POA: Diagnosis not present

## 2016-04-04 DIAGNOSIS — Z794 Long term (current) use of insulin: Secondary | ICD-10-CM | POA: Insufficient documentation

## 2016-04-04 DIAGNOSIS — E1140 Type 2 diabetes mellitus with diabetic neuropathy, unspecified: Secondary | ICD-10-CM | POA: Diagnosis not present

## 2016-04-04 DIAGNOSIS — Z7984 Long term (current) use of oral hypoglycemic drugs: Secondary | ICD-10-CM | POA: Diagnosis not present

## 2016-04-04 LAB — CBC WITH DIFFERENTIAL/PLATELET
BASOS ABS: 0 10*3/uL (ref 0.0–0.1)
Basophils Relative: 0 %
EOS ABS: 0.5 10*3/uL (ref 0.0–0.7)
EOS PCT: 4 %
HEMATOCRIT: 33.5 % — AB (ref 36.0–46.0)
HEMOGLOBIN: 9.8 g/dL — AB (ref 12.0–15.0)
LYMPHS ABS: 3.5 10*3/uL (ref 0.7–4.0)
Lymphocytes Relative: 31 %
MCH: 20.2 pg — ABNORMAL LOW (ref 26.0–34.0)
MCHC: 29.3 g/dL — ABNORMAL LOW (ref 30.0–36.0)
MCV: 68.9 fL — AB (ref 78.0–100.0)
MONO ABS: 0.9 10*3/uL (ref 0.1–1.0)
MONOS PCT: 8 %
Neutro Abs: 6.6 10*3/uL (ref 1.7–7.7)
Neutrophils Relative %: 57 %
Platelets: 372 10*3/uL (ref 150–400)
RBC: 4.86 MIL/uL (ref 3.87–5.11)
RDW: 18.7 % — AB (ref 11.5–15.5)
WBC: 11.5 10*3/uL — ABNORMAL HIGH (ref 4.0–10.5)

## 2016-04-04 LAB — BASIC METABOLIC PANEL
ANION GAP: 8 (ref 5–15)
BUN: 12 mg/dL (ref 6–20)
CALCIUM: 8.8 mg/dL — AB (ref 8.9–10.3)
CO2: 24 mmol/L (ref 22–32)
Chloride: 106 mmol/L (ref 101–111)
Creatinine, Ser: 0.71 mg/dL (ref 0.44–1.00)
GFR calc non Af Amer: >60 mL/min (ref >60)
GLUCOSE: 295 mg/dL — AB (ref 65–99)
POTASSIUM: 4.2 mmol/L (ref 3.5–5.1)
Sodium: 138 mmol/L (ref 135–145)

## 2016-04-04 LAB — URINE MICROSCOPIC-ADD ON: RBC / HPF: NONE SEEN RBC/hpf (ref 0–5)

## 2016-04-04 LAB — URINALYSIS, ROUTINE W REFLEX MICROSCOPIC
BILIRUBIN URINE: NEGATIVE
Glucose, UA: >1000 mg/dL — AB
Hgb urine dipstick: NEGATIVE
KETONES UR: NEGATIVE mg/dL
LEUKOCYTES UA: NEGATIVE
NITRITE: NEGATIVE
Protein, ur: NEGATIVE mg/dL
pH: 5.5 (ref 5.0–8.0)

## 2016-04-04 LAB — TROPONIN I: Troponin I: <0.03 ng/mL (ref <0.031)

## 2016-04-04 MED ORDER — TRAMADOL HCL 50 MG PO TABS: 50.0000 mg | ORAL_TABLET | Freq: Four times a day (QID) | ORAL | Status: DC | PRN

## 2016-04-04 MED ORDER — LORAZEPAM 2 MG/ML IJ SOLN
1.0000 mg | Freq: Once | INTRAMUSCULAR | Status: AC
  Administered 2016-04-04: 1 mg via INTRAVENOUS
  Filled 2016-04-04: qty 1

## 2016-04-04 MED ORDER — FENTANYL CITRATE (PF) 100 MCG/2ML IJ SOLN
12.5000 ug | Freq: Once | INTRAMUSCULAR | Status: AC
  Administered 2016-04-04: 12.5 ug via INTRAVENOUS
  Filled 2016-04-04: qty 2

## 2016-04-04 MED ORDER — ONDANSETRON HCL 4 MG/2ML IJ SOLN
4.0000 mg | Freq: Once | INTRAMUSCULAR | Status: AC
  Administered 2016-04-04: 4 mg via INTRAVENOUS
  Filled 2016-04-04: qty 2

## 2016-04-04 NOTE — ED Notes (Signed)
MD at bedside. 

## 2016-04-04 NOTE — ED Notes (Signed)
Pt reports was here 2 weeks ago and discharged.  PT says hasn't felt well since then and has had generalized weakness.  Today weakness worse.  Pt says gets diaphoretic and extremely weak.  While getting pt out of the car she started c/o chest pain.  Chest pain has subsided at this time.

## 2016-04-04 NOTE — ED Notes (Signed)
Patient denies any pain at this time, but states that she feels " unbalanced".

## 2016-04-04 NOTE — Discharge Instructions (Signed)
-  Extensive workup here without any significant findings MRI brain negative for stroke. X-rays of both hips and pelvis without any bony injuries other than evidence of arthritis. Will treat for the most likely arthritis pain in the left hip. May Limit the follow-up with your regular doctor. Return for any new or worse symptoms.

## 2016-04-04 NOTE — ED Provider Notes (Signed)
CSN: [id number]     Arrival date & time 04/04/16  1515 History   First MD Initiated Contact with Patient 04/04/16 1545     Chief Complaint  Patient presents with  . Weakness     (Consider location/radiation/quality/duration/timing/severity/associated sxs/prior Treatment) Patient is a 80 y.o. female presenting with weakness. The history is provided by the patient.  Weakness Associated symptoms include chest pain. Pertinent negatives include no abdominal pain, no headaches and no shortness of breath.   patient states that she hasn't felt well since her discharge 2 weeks ago from the hospital. [place] Spaniel that generalized weakness all over which got worse today. Associated with an episode of diaphoresis and she had some chest pain upon arrival here getting out of a car that resolved the time she was in the emergency department. It was brief. Patient states she feels weak all over and predominantly has complaint of pain to her left hip. There was no fall or injury. Patient was admitted March 27 for urinary tract infection.  Past Medical History  Diagnosis Date  . Arteriosclerotic cardiovascular disease (ASCVD) CARDIOLOGIST-   DR Dietrich Pates     CABG surgery in 2001; nl stress nuclear-2007; angiography in 01/2006- TO of LAD; 70% ostial stenosis      of a small ramus; patent grafts; normal EF; cath in 12/2009 essentially unchanged; EF-66%;  Congestive heart failure with preserved LV systolic function  . Hypertension   . Varicose veins   . Hypothyroidism   . Insulin dependent type 2 diabetes mellitus (HCC)   . Peripheral neuropathy (HCC)   . Diastolic CHF (HCC)   . Hyperlipidemia   . History of CVA (cerebrovascular accident)     2013--  LACUNAR INFARCTIONS RIGHT THALAMUS---  residual left side of  lip numb  and left eye vision worse (which corrented lens implant from cataract extraction)  . Lesion of bladder   . Hematuria   . Arthritis   . DDD (degenerative disc disease), lumbar   . OSA (obstructive  sleep apnea)   . Chronic obstructive pulmonary disease (HCC)     Chronic bronchitis  . History of MI (myocardial infarction)     07/ 2001   s/p  cabg x2  . S/P CABG x 2     07-02-2000  . SUI (stress urinary incontinence, female)   . Lower urinary tract symptoms (LUTS)   . Psoriasis     lower leg  . Thinning of skin    Past Surgical History  Procedure Laterality Date  . Rotator cuff repair Right 1995  . Lumbar spine surgery  x2    yrs ago  . Carpal tunnel release Bilateral yrs ago  . Orif humerus fracture Right 04-18-2008  . Knee arthroscopy Left 10-18-2003    SYNOVECTOMY  . Repair flexor tendon hand Right 09-20-2008    CARPI RADIALIS TENDON TRANSFER TO EXTENSOR TO INDEX, MIDDLE, RING , LITTLE FINGERS AND DIGITI MININI  . Cardiovascular stress test  08-28-2011   DR ROTHBART    NEGATIVE NUCLEAR STUDY/  GLOBAL LVSF/  EF 65%  . Cholecystectomy  1990  . Appendectomy  age 31  . Total abdominal hysterectomy w/ bilateral salpingoophorectomy  age 12  . Cataract extraction w/ intraocular lens  implant, bilateral  2013  . Cardiac catheterization  1991    No sig. cad  . Coronary angioplasty  11-13-1999    balloon angioplasty to mLAD , d2 of LAD  . Coronary angioplasty with stent placement  01-17-2000    PCI  stenting to mLAD & D2 of LAD  . Cardiac catheterization  07-01-2000    high grade in-stent restenosis  . Cardiac catheterization  04-30-2001    single native vessel cad/ graft patent  . Cardiac catheterization  02-13-2006  dr Juanda Chancebrodie    2V cad with total occluded LAD  and  70% ostial of small ramus/  grafts patent/  normal ef  . Cardiac catheterization  01-20-2000  dr Clifton Jamesmcalhany    essentially unchanged;  severe single vessel cad with diffuse disease throughout CFX and RCA/ ef 66%;  chf with preserved LVSF  . Coronary artery bypass graft  07-02-2000   DR Tressie StalkerLARENCE OWEN    SVG to diagonal, LIMA to LAD  . Transthoracic echocardiogram  12-12-2013    MILD LVH/  EF 60-65%/  GRADE I  DIASTOLIC DYSFUNCTION/  MILD LAE  . Cystoscopy w/ retrogrades Bilateral 08/09/2014    Procedure: CYSTOSCOPY, BILATERAL RETROGRADE, HYDRODISTENSION, BLADDER BIOPSY WITH FULGERATION, INSTILL PYRIDIUM AND MARCAINE ;  Surgeon: Anner CreteJohn J Wrenn, MD;  [facility];  Service: Urology;  Laterality: Bilateral;  . Colonoscopy N/A 07/26/2015    Procedure: COLONOSCOPY;  Surgeon: Malissa HippoNajeeb U Rehman, MD;  Location: [facility];  Service: Endoscopy;  Laterality: N/A;  1200   Family History  Problem Relation Age of Onset  . Coronary artery disease      Multiple first and second-degree relatives  . Aneurysm      Cerebral circulation  . Hypertension Mother   . Colon cancer Sister   . Clotting disorder      Children diagnosed with hypercoagulable state   Social History  Substance Use Topics  . Smoking status: Never Smoker   . Smokeless tobacco: Never Used  . Alcohol Use: No   OB History    Gravida Para Term Preterm AB TAB SAB Ectopic Multiple Living   7 7 7       4      Review of Systems  Constitutional: Positive for diaphoresis. Negative for fever.  HENT: Negative for congestion.   Eyes: Negative for visual disturbance.  Respiratory: Negative for shortness of breath.   Cardiovascular: Positive for chest pain.  Gastrointestinal: Negative for abdominal pain.  Genitourinary: Negative for dysuria.  Musculoskeletal: Positive for back pain.  Skin: Negative for rash.  Neurological: Positive for weakness and numbness. Negative for speech difficulty and headaches.      Allergies  Codeine  Home Medications   Prior to Admission medications   Medication Sig Start Date End Date Taking? Authorizing Provider  albuterol (PROVENTIL) (2.5 MG/3ML) 0.083% nebulizer solution Take 3 mLs (2.5 mg total) by nebulization every 6 (six) hours as needed for wheezing or shortness of breath. 09/11/14  Yes Eber HongBrian Miller, MD  aspirin EC 325 MG tablet Take 325 mg by mouth daily.   Yes Historical  Provider, MD  Insulin Degludec (TRESIBA FLEXTOUCH) 200 UNIT/ML SOPN Inject 35-70 Units into the skin 2 (two) times daily. 35 units in the morning and 70 units in the evening   Yes Historical Provider, MD  levothyroxine (SYNTHROID, LEVOTHROID) 25 MCG tablet Take 25 mcg by mouth every morning.    Yes Historical Provider, MD  lisinopril (PRINIVIL,ZESTRIL) 10 MG tablet Take 1 tablet by mouth every morning.  11/09/13  Yes Historical Provider, MD  LORazepam (ATIVAN) 1 MG tablet Take 1 mg by mouth at bedtime. 03/29/16  Yes Historical Provider, MD  metFORMIN (GLUCOPHAGE) 500 MG tablet Take 500 mg by mouth 2 (two) times daily with a  meal.     Yes Historical Provider, MD  nitroGLYCERIN (NITROSTAT) 0.4 MG SL tablet Place 0.4 mg under the tongue every 5 (five) minutes as needed for chest pain.    Yes Historical Provider, MD  NOVOLIN 70/30 RELION (70-30) 100 UNIT/ML injection Inject 20 Units into the skin 3 (three) times daily before meals. 02/25/16  Yes Historical Provider, MD  oxyCODONE-acetaminophen (PERCOCET) 7.5-325 MG tablet Take 1 tablet by mouth 4 (four) times daily as needed for moderate pain.  02/29/16  Yes Historical Provider, MD  pravastatin (PRAVACHOL) 80 MG tablet Take 1 tablet (80 mg total) by mouth every evening. 03/22/16  Yes Oval Linsey, MD  traMADol (ULTRAM) 50 MG tablet Take 1 tablet (50 mg total) by mouth every 6 (six) hours as needed. 04/04/16   Vanetta Mulders, MD   BP 169/94 mmHg  Pulse 72  Temp(Src) 98.3 F (36.8 C) (Oral)  Resp 20  SpO2 90% Physical Exam  Constitutional: She is oriented to person, place, and time. She appears well-developed and well-nourished. No distress.  HENT:  Head: Normocephalic and atraumatic.  Mouth/Throat: Oropharynx is clear and moist.  Eyes: Conjunctivae and EOM are normal. Pupils are equal, round, and reactive to light.  Neck: Normal range of motion. Neck supple.  Cardiovascular: Normal rate, regular rhythm and normal heart sounds.   No murmur  heard. Pulmonary/Chest: Effort normal and breath sounds normal.  Abdominal: Soft. Bowel sounds are normal. There is no tenderness.  Musculoskeletal: Normal range of motion.  Some pain with range of motion of the left hip area. No evidence of deformity.  Neurological: She is alert and oriented to person, place, and time. No cranial nerve deficit. She exhibits normal muscle tone. Coordination normal.  Skin: Skin is warm.  Nursing note and vitals reviewed.   ED Course  Procedures (including critical care time) Labs Review Labs Reviewed  CBC WITH DIFFERENTIAL/PLATELET - Abnormal; Notable for the following:    WBC 11.5 (*)    Hemoglobin 9.8 (*)    HCT 33.5 (*)    MCV 68.9 (*)    MCH 20.2 (*)    MCHC 29.3 (*)    RDW 18.7 (*)    All other components within normal limits  BASIC METABOLIC PANEL - Abnormal; Notable for the following:    Glucose, Bld 295 (*)    Calcium 8.8 (*)    All other components within normal limits  URINALYSIS, ROUTINE W REFLEX MICROSCOPIC (NOT AT [facility]) - Abnormal; Notable for the following:    Specific Gravity, Urine >1.030 (*)    Glucose, UA >1000 (*)    All other components within normal limits  URINE MICROSCOPIC-ADD ON - Abnormal; Notable for the following:    Squamous Epithelial / LPF 0-5 (*)    Bacteria, UA RARE (*)    All other components within normal limits  TROPONIN I    Imaging Review Ct Head Wo Contrast  04/04/2016  CLINICAL DATA:  Weakness, diaphoresis for 2 days worse today, syncopal episode today, diabetes mellitus, hypertension, CHF, coronary artery disease post MI and CABG. Prior stroke EXAM: CT HEAD WITHOUT CONTRAST TECHNIQUE: Contiguous axial images were obtained from the base of the skull through the vertex without intravenous contrast. COMPARISON:  03/18/2016 FINDINGS: Scattered streak artifacts. Generalized atrophy. Normal ventricular morphology. No midline shift or mass effect. Small vessel chronic ischemic changes of deep cerebral white  matter. No intracranial hemorrhage, mass lesion, or acute infarction. Visualized paranasal sinuses and mastoid air cells clear. Bones unremarkable. IMPRESSION: Atrophy  with small vessel chronic ischemic changes of deep cerebral white matter. No acute intracranial abnormalities. Electronically Signed   By: Ulyses Southward M.D.   On: 04/04/2016 17:24   [name] Contrast  04/04/2016  CLINICAL DATA:  80 year old female with multiple syncopal episodes in the last several weeks. Increasing weakness, dizziness. Initial encounter. EXAM: MRI HEAD WITHOUT CONTRAST TECHNIQUE: Multiplanar, multiecho pulse sequences of the brain and surrounding structures were obtained without intravenous contrast. COMPARISON:  Head CT without contrast 1703 hours today. Brain MRI 03/18/2016, and earlier. FINDINGS: Study is intermittently degraded by motion artifact despite repeated imaging attempts. Major intracranial vascular flow voids appear stable. No restricted diffusion to suggest acute infarction. No midline shift, mass effect, evidence of mass lesion, ventriculomegaly, extra-axial collection or acute intracranial hemorrhage. Cervicomedullary junction and pituitary are within normal limits. Negative visualized cervical spine. Stable gray-white matter differentiation throughout the brain. Nonspecific T2 and FLAIR hyperintensity in the cerebral white matter and pons appears stable. Suggestion of small chronic lacunar infarcts in both thalami again noted. No new signal abnormality identified. Grossly stable and normal visualized internal auditory structures. Trace mastoid effusions appear stable, and the nasopharynx appears normal. Mild ethmoid sinus mucosal thickening is stable. Postoperative changes to both globes again noted common negative orbit and scalp soft tissues. Normal bone marrow signal. IMPRESSION: No acute intracranial abnormality. Stable noncontrast MRI appearance of the brain since March, with mild motion degradation today.  Electronically Signed   By: Odessa Fleming M.D.   On: 04/04/2016 19:58   Dg Chest Portable 1 View  04/04/2016  CLINICAL DATA:  Chest pain EXAM: PORTABLE CHEST 1 VIEW COMPARISON:  03/18/2016 FINDINGS: Cardiac enlargement. Mild vascular congestion without edema or effusion. Mild bibasilar atelectasis. Postop CABG. IMPRESSION: Mild vascular congestion without edema.  Mild bibasilar atelectasis. Electronically Signed   By: Marlan Palau M.D.   On: 04/04/2016 15:35   Dg Hips Bilat With Pelvis 3-4 Views  04/04/2016  CLINICAL DATA:  Persistent pain after several recent falls EXAM: DG HIP (WITH OR WITHOUT PELVIS) 3-4V BILAT COMPARISON:  None. FINDINGS: Frontal pelvis as well as frontal and lateral views of each hip-total five views- obtained. No acute fracture or dislocation evident. There is mild symmetric narrowing of both hip joints. No erosive change. There is extensive arterial vascular calcification bilaterally. IMPRESSION: Symmetric osteoarthritic change in both hip joints. Multiple foci of arterial vascular calcification. No fracture or dislocation. Electronically Signed   By: Bretta Bang III M.D.   On: 04/04/2016 17:00   I have personally reviewed and evaluated these images and lab results as part of my medical decision-making.   EKG Interpretation   Date/Time:  Thursday April 04 2016 15:16:53 EDT Ventricular Rate:  82 PR Interval:  214 QRS Duration: 100 QT Interval:  388 QTC Calculation: 453 R Axis:   -43 Text Interpretation:  Sinus rhythm Borderline prolonged PR interval Left  axis deviation Consider anterior infarct Abnormal T, consider ischemia,  lateral leads No significant change since last tracing Confirmed by  Lara Palinkas  MD, Vitalia Stough [phone]) on 04/04/2016 3:50:34 PM      MDM   Final diagnoses:  Hip pain, left  Weakness    Patient with complaint of generalized weakness since her discharge 2 weeks ago. Patient also with the complaint of left hip pain. No falls. Patient states  that she is weak all over. Today the weakness was worse. Patient got diaphoretic with one of the episodes of weakness. Patient had a brief period of chest pain  when getting out of the car subsided by the time she got into the ED.  X-rays of hips and pelvis shows evidence of  osteoarthritic changes which may explain her pain. Chest x-ray is negative for evidence of pneumonia pulmonary edema or pneumothorax. Troponin was negative. Mild leukocytosis urinalysis negative for urinary tract infection.  Head CT and MRI of the brain without evidence of any acute infarct. Will treat with tramadol for the hip arthritis pain and have her follow-up with her regular doctor. Patient nontoxic no acute distress.    Vanetta Mulders, MD 04/04/16 2031

## 2016-04-04 NOTE — ED Notes (Signed)
Patient is resting comfortably. 

## 2016-04-20 ENCOUNTER — Emergency Department (HOSPITAL_COMMUNITY)
Admission: EM | Admit: 2016-04-20 | Discharge: 2016-04-20 | Disposition: A | Discharge status: Home / Self Care | Payer: Medicare HMO | Source: Home / Self Care | Attending: Emergency Medicine | Admitting: Emergency Medicine

## 2016-04-20 ENCOUNTER — Encounter (HOSPITAL_COMMUNITY): Payer: Self-pay | Admitting: Emergency Medicine

## 2016-04-20 ENCOUNTER — Emergency Department (HOSPITAL_COMMUNITY): Payer: Medicare HMO

## 2016-04-20 DIAGNOSIS — I10 Essential (primary) hypertension: Secondary | ICD-10-CM

## 2016-04-20 DIAGNOSIS — E119 Type 2 diabetes mellitus without complications: Secondary | ICD-10-CM | POA: Insufficient documentation

## 2016-04-20 DIAGNOSIS — J449 Chronic obstructive pulmonary disease, unspecified: Secondary | ICD-10-CM | POA: Insufficient documentation

## 2016-04-20 DIAGNOSIS — Z794 Long term (current) use of insulin: Secondary | ICD-10-CM

## 2016-04-20 DIAGNOSIS — Z79899 Other long term (current) drug therapy: Secondary | ICD-10-CM

## 2016-04-20 DIAGNOSIS — J40 Bronchitis, not specified as acute or chronic: Secondary | ICD-10-CM

## 2016-04-20 DIAGNOSIS — I509 Heart failure, unspecified: Secondary | ICD-10-CM

## 2016-04-20 DIAGNOSIS — E039 Hypothyroidism, unspecified: Secondary | ICD-10-CM | POA: Insufficient documentation

## 2016-04-20 DIAGNOSIS — Z8673 Personal history of transient ischemic attack (TIA), and cerebral infarction without residual deficits: Secondary | ICD-10-CM

## 2016-04-20 DIAGNOSIS — Z951 Presence of aortocoronary bypass graft: Secondary | ICD-10-CM | POA: Insufficient documentation

## 2016-04-20 DIAGNOSIS — E785 Hyperlipidemia, unspecified: Secondary | ICD-10-CM | POA: Insufficient documentation

## 2016-04-20 DIAGNOSIS — Z7982 Long term (current) use of aspirin: Secondary | ICD-10-CM

## 2016-04-20 LAB — COMPREHENSIVE METABOLIC PANEL
ALBUMIN: 3.7 g/dL (ref 3.5–5.0)
ALK PHOS: 76 U/L (ref 38–126)
ALT: 13 U/L — AB (ref 14–54)
AST: 18 U/L (ref 15–41)
Anion gap: 8 (ref 5–15)
BUN: 5 mg/dL — ABNORMAL LOW (ref 6–20)
CALCIUM: 9.1 mg/dL (ref 8.9–10.3)
CO2: 26 mmol/L (ref 22–32)
CREATININE: 0.62 mg/dL (ref 0.44–1.00)
Chloride: 105 mmol/L (ref 101–111)
GFR calc Af Amer: >60 mL/min (ref >60)
GFR calc non Af Amer: >60 mL/min (ref >60)
GLUCOSE: 108 mg/dL — AB (ref 65–99)
Potassium: 4.1 mmol/L (ref 3.5–5.1)
SODIUM: 139 mmol/L (ref 135–145)
Total Bilirubin: 0.4 mg/dL (ref 0.3–1.2)
Total Protein: 6.9 g/dL (ref 6.5–8.1)

## 2016-04-20 LAB — CBC WITH DIFFERENTIAL/PLATELET
BASOS ABS: 0 10*3/uL (ref 0.0–0.1)
BASOS PCT: 0 %
EOS ABS: 0.4 10*3/uL (ref 0.0–0.7)
Eosinophils Relative: 5 %
HCT: 34.2 % — ABNORMAL LOW (ref 36.0–46.0)
HEMOGLOBIN: 9.7 g/dL — AB (ref 12.0–15.0)
LYMPHS ABS: 1.3 10*3/uL (ref 0.7–4.0)
Lymphocytes Relative: 15 %
MCH: 19.4 pg — ABNORMAL LOW (ref 26.0–34.0)
MCHC: 28.4 g/dL — ABNORMAL LOW (ref 30.0–36.0)
MCV: 68.4 fL — ABNORMAL LOW (ref 78.0–100.0)
Monocytes Absolute: 1 10*3/uL (ref 0.1–1.0)
Monocytes Relative: 12 %
NEUTROS PCT: 67 %
Neutro Abs: 5.8 10*3/uL (ref 1.7–7.7)
Platelets: 294 10*3/uL (ref 150–400)
RBC: 5 MIL/uL (ref 3.87–5.11)
RDW: 18.6 % — ABNORMAL HIGH (ref 11.5–15.5)
WBC: 8.5 10*3/uL (ref 4.0–10.5)

## 2016-04-20 LAB — BRAIN NATRIURETIC PEPTIDE: B Natriuretic Peptide: 140 pg/mL — ABNORMAL HIGH (ref 0.0–100.0)

## 2016-04-20 MED ORDER — ALBUTEROL SULFATE (2.5 MG/3ML) 0.083% IN NEBU: 2.5000 mg | INHALATION_SOLUTION | RESPIRATORY_TRACT | Status: DC | PRN

## 2016-04-20 MED ORDER — ONDANSETRON 4 MG PO TBDP: ORAL_TABLET | ORAL | Status: DC

## 2016-04-20 MED ORDER — IPRATROPIUM-ALBUTEROL 0.5-2.5 (3) MG/3ML IN SOLN
3.0000 mL | Freq: Once | RESPIRATORY_TRACT | Status: AC
  Administered 2016-04-20: 3 mL via RESPIRATORY_TRACT
  Filled 2016-04-20: qty 3

## 2016-04-20 MED ORDER — AZITHROMYCIN 250 MG PO TABS
500.0000 mg | ORAL_TABLET | Freq: Once | ORAL | Status: AC
  Administered 2016-04-20: 500 mg via ORAL
  Filled 2016-04-20: qty 2

## 2016-04-20 MED ORDER — ONDANSETRON 4 MG PO TBDP
4.0000 mg | ORAL_TABLET | Freq: Once | ORAL | Status: AC
  Administered 2016-04-20: 4 mg via ORAL
  Filled 2016-04-20: qty 1

## 2016-04-20 MED ORDER — ALBUTEROL SULFATE (2.5 MG/3ML) 0.083% IN NEBU
2.5000 mg | INHALATION_SOLUTION | Freq: Once | RESPIRATORY_TRACT | Status: AC
  Administered 2016-04-20: 2.5 mg via RESPIRATORY_TRACT
  Filled 2016-04-20: qty 3

## 2016-04-20 MED ORDER — AZITHROMYCIN 250 MG PO TABS: ORAL_TABLET | ORAL | Status: DC

## 2016-04-20 NOTE — ED Provider Notes (Signed)
CSN: [id number]     Arrival date & time 04/20/16  1306 History   First MD Initiated Contact with Patient 04/20/16 1314     Chief Complaint  Patient presents with  . Cough     (Consider location/radiation/quality/duration/timing/severity/associated sxs/prior Treatment) Patient is a 80 y.o. female presenting with cough. The history is provided by the patient (He complains of cough and shortness of breath for couple days. Patient has had some fever also).  Cough Cough characteristics:  Non-productive Severity:  Moderate Onset quality:  Sudden Timing:  Constant Progression:  Waxing and waning Chronicity:  New Smoker: no   Context: not animal exposure   Relieved by:  Nothing Worsened by:  Nothing tried Associated symptoms: wheezing   Associated symptoms: no chest pain, no eye discharge, no headaches and no rash     Past Medical History  Diagnosis Date  . Arteriosclerotic cardiovascular disease (ASCVD) CARDIOLOGIST-   DR Dietrich Pates     CABG surgery in 2001; nl stress nuclear-2007; angiography in 01/2006- TO of LAD; 70% ostial stenosis      of a small ramus; patent grafts; normal EF; cath in 12/2009 essentially unchanged; EF-66%;  Congestive heart failure with preserved LV systolic function  . Hypertension   . Varicose veins   . Hypothyroidism   . Insulin dependent type 2 diabetes mellitus (HCC)   . Peripheral neuropathy (HCC)   . Diastolic CHF (HCC)   . Hyperlipidemia   . History of CVA (cerebrovascular accident)     2013--  LACUNAR INFARCTIONS RIGHT THALAMUS---  residual left side of  lip numb  and left eye vision worse (which corrented lens implant from cataract extraction)  . Lesion of bladder   . Hematuria   . Arthritis   . DDD (degenerative disc disease), lumbar   . OSA (obstructive sleep apnea)   . Chronic obstructive pulmonary disease (HCC)     Chronic bronchitis  . History of MI (myocardial infarction)     07/ 2001   s/p  cabg x2  . S/P CABG x 2     07-02-2000  . SUI  (stress urinary incontinence, female)   . Lower urinary tract symptoms (LUTS)   . Psoriasis     lower leg  . Thinning of skin    Past Surgical History  Procedure Laterality Date  . Rotator cuff repair Right 1995  . Lumbar spine surgery  x2    yrs ago  . Carpal tunnel release Bilateral yrs ago  . Orif humerus fracture Right 04-18-2008  . Knee arthroscopy Left 10-18-2003    SYNOVECTOMY  . Repair flexor tendon hand Right 09-20-2008    CARPI RADIALIS TENDON TRANSFER TO EXTENSOR TO INDEX, MIDDLE, RING , LITTLE FINGERS AND DIGITI MININI  . Cardiovascular stress test  08-28-2011   DR ROTHBART    NEGATIVE NUCLEAR STUDY/  GLOBAL LVSF/  EF 65%  . Cholecystectomy  1990  . Appendectomy  age 75  . Total abdominal hysterectomy w/ bilateral salpingoophorectomy  age 59  . Cataract extraction w/ intraocular lens  implant, bilateral  2013  . Cardiac catheterization  1991    No sig. cad  . Coronary angioplasty  11-13-1999    balloon angioplasty to mLAD , d2 of LAD  . Coronary angioplasty with stent placement  01-17-2000    PCI stenting to mLAD & D2 of LAD  . Cardiac catheterization  07-01-2000    high grade in-stent restenosis  . Cardiac catheterization  04-30-2001    single native vessel  cad/ graft patent  . Cardiac catheterization  02-13-2006  dr Juanda Chance    2V cad with total occluded LAD  and  70% ostial of small ramus/  grafts patent/  normal ef  . Cardiac catheterization  01-20-2000  dr Clifton James    essentially unchanged;  severe single vessel cad with diffuse disease throughout CFX and RCA/ ef 66%;  chf with preserved LVSF  . Coronary artery bypass graft  07-02-2000   DR Tressie Stalker    SVG to diagonal, LIMA to LAD  . Transthoracic echocardiogram  12-12-2013    MILD LVH/  EF 60-65%/  GRADE I DIASTOLIC DYSFUNCTION/  MILD LAE  . Cystoscopy w/ retrogrades Bilateral 08/09/2014    Procedure: CYSTOSCOPY, BILATERAL RETROGRADE, HYDRODISTENSION, BLADDER BIOPSY WITH FULGERATION, INSTILL PYRIDIUM AND  MARCAINE ;  Surgeon: Anner Crete, MD;  Location: [facility];  Service: Urology;  Laterality: Bilateral;  . Colonoscopy N/A 07/26/2015    Procedure: COLONOSCOPY;  Surgeon: Malissa Hippo, MD;  Location: [facility];  Service: Endoscopy;  Laterality: N/A;  1200   Family History  Problem Relation Age of Onset  . Coronary artery disease      Multiple first and second-degree relatives  . Aneurysm      Cerebral circulation  . Hypertension Mother   . Colon cancer Sister   . Clotting disorder      Children diagnosed with hypercoagulable state   Social History  Substance Use Topics  . Smoking status: Never Smoker   . Smokeless tobacco: Never Used  . Alcohol Use: No   OB History    Gravida Para Term Preterm AB TAB SAB Ectopic Multiple Living   Review of Systems  Constitutional: Negative for appetite change and fatigue.  HENT: Negative for congestion, ear discharge and sinus pressure.   Eyes: Negative for discharge.  Respiratory: Positive for cough and wheezing.   Cardiovascular: Negative for chest pain.  Gastrointestinal: Negative for abdominal pain and diarrhea.  Genitourinary: Negative for frequency and hematuria.  Musculoskeletal: Negative for back pain.  Skin: Negative for rash.  Neurological: Negative for seizures and headaches.  Psychiatric/Behavioral: Negative for hallucinations.      Allergies  Codeine  Home Medications   Prior to Admission medications   Medication Sig Start Date End Date Taking? Authorizing Provider  aspirin EC 325 MG tablet Take 325 mg by mouth daily.   Yes Historical Provider, MD  Insulin Degludec (TRESIBA FLEXTOUCH) 200 UNIT/ML SOPN Inject 35-70 Units into the skin 2 (two) times daily. 35 units in the morning and 70 units in the evening   Yes Historical Provider, MD  levothyroxine (SYNTHROID, LEVOTHROID) 25 MCG tablet Take 25 mcg by mouth every morning.    Yes Historical Provider, MD  lisinopril  (PRINIVIL,ZESTRIL) 10 MG tablet Take 1 tablet by mouth every morning.  11/09/13  Yes Historical Provider, MD  LORazepam (ATIVAN) 1 MG tablet Take 1 mg by mouth at bedtime. 03/29/16  Yes Historical Provider, MD  metFORMIN (GLUCOPHAGE) 500 MG tablet Take 500 mg by mouth 2 (two) times daily with a meal.     Yes Historical Provider, MD  nitroGLYCERIN (NITROSTAT) 0.4 MG SL tablet Place 0.4 mg under the tongue every 5 (five) minutes as needed for chest pain.    Yes Historical Provider, MD  NOVOLIN 70/30 RELION (70-30) 100 UNIT/ML injection Inject 20 Units into the skin 3 (three) times daily before meals. 02/25/16  Yes Historical Provider, MD  oxyCODONE-acetaminophen (PERCOCET) 7.5-325 MG tablet Take 1 tablet by mouth 4 (four) times daily as needed for moderate pain.  02/29/16  Yes Historical Provider, MD  pravastatin (PRAVACHOL) 40 MG tablet Take 40 mg by mouth daily.   Yes Historical Provider, MD  traMADol (ULTRAM) 50 MG tablet Take 1 tablet (50 mg total) by mouth every 6 (six) hours as needed. 04/04/16  Yes Vanetta Mulders, MD  albuterol (PROVENTIL) (2.5 MG/3ML) 0.083% nebulizer solution Take 3 mLs (2.5 mg total) by nebulization every 4 (four) hours as needed for wheezing or shortness of breath. 04/20/16   Bethann Berkshire, MD  azithromycin (ZITHROMAX) 250 MG tablet Take one tablet a day.  Start tomorrow sunday 04/20/16   Bethann Berkshire, MD  ondansetron (ZOFRAN ODT) 4 MG disintegrating tablet  ODT q4 hours prn nausea/vomit 04/20/16   Bethann Berkshire, MD   BP 157/83 mmHg  Pulse 81  Temp(Src) 99.1 F (37.3 C) (Oral)  Resp 20  Ht  (1.676 m)  Wt 205 lb (92.987 kg)  BMI 33.10 kg/m2  SpO2 99% Physical Exam  Constitutional: She is oriented to person, place, and time. She appears well-developed.  HENT:  Head: Normocephalic.  Eyes: Conjunctivae and EOM are normal. No scleral icterus.  Neck: Neck supple. No thyromegaly present.  Cardiovascular: Normal rate and regular rhythm.  Exam reveals no gallop and no  friction rub.   No murmur heard. Pulmonary/Chest: No stridor. She has wheezes. She has no rales. She exhibits no tenderness.  Abdominal: She exhibits no distension. There is no tenderness. There is no rebound.  Musculoskeletal: Normal range of motion. She exhibits no edema.  Lymphadenopathy:    She has no cervical adenopathy.  Neurological: She is oriented to person, place, and time. She exhibits normal muscle tone. Coordination normal.  Skin: No rash noted. No erythema.  Psychiatric: She has a normal mood and affect. Her behavior is normal.    ED Course  Procedures (including critical care time) Labs Review Labs Reviewed  CBC WITH DIFFERENTIAL/PLATELET - Abnormal; Notable for the following:    Hemoglobin 9.7 (*)    HCT 34.2 (*)    MCV 68.4 (*)    MCH 19.4 (*)    MCHC 28.4 (*)    RDW 18.6 (*)    All other components within normal limits  COMPREHENSIVE METABOLIC PANEL - Abnormal; Notable for the following:    Glucose, Bld 108 (*)    BUN 5 (*)    ALT 13 (*)    All other components within normal limits  BRAIN NATRIURETIC PEPTIDE - Abnormal; Notable for the following:    B Natriuretic Peptide 140.0 (*)    All other components within normal limits    Imaging Review Dg Chest 2 View  04/20/2016  CLINICAL DATA:  Patient with worsening cough and shortness of breath. EXAM: CHEST  2 VIEW COMPARISON:  Chest radiograph 04/04/2016. FINDINGS: Stable cardiac and mediastinal contours status post median sternotomy and CABG procedure. No consolidative pulmonary opacity. No pleural effusion or pneumothorax. Thoracic spine degenerative changes. Cholecystectomy clips. Surgical hardware involving the right humerus. IMPRESSION: No acute cardiopulmonary process. Electronically Signed   By: Annia Belt M.D.   On: 04/20/2016 13:57   I have personally reviewed and evaluated these images and lab results as part of my medical decision-making.   EKG Interpretation None      MDM   Final diagnoses:   Bronchitis    Labs unremarkable chest x-ray negative for pneumonia. Patient  improved with neb treatment. Suspect bronchitis and bronchospasm patient put on Zithromax and albuterol. She is also given Zofran. And she will follow-up with her PCP next week    Bethann BerkshireJoseph Aishi Courts, MD 04/20/16 1452

## 2016-04-20 NOTE — ED Notes (Signed)
Patient with no complaints at this time. Respirations even and unlabored. Skin warm/dry. Discharge instructions reviewed with patient at this time. Patient given opportunity to voice concerns/ask questions. Patient discharged at this time and left Emergency Department via wheelchair. 

## 2016-04-20 NOTE — ED Notes (Addendum)
Patient c/o cough with thick yellow, fevers, body aches, and chills. Per patient coughing started yesterday but states "I haven't felt good in a couple of days. Patient reports taking tylenol with no relief. Denies any  vomiting or diarrhea but reports some nausea.

## 2016-04-20 NOTE — Discharge Instructions (Signed)
Follow up with your md next week.  Drink plenty of fluids °

## 2016-04-21 ENCOUNTER — Emergency Department (HOSPITAL_COMMUNITY): Payer: Medicare HMO

## 2016-04-21 ENCOUNTER — Inpatient Hospital Stay (HOSPITAL_COMMUNITY)
Admission: EM | Admit: 2016-04-21 | Discharge: 2016-04-25 | DRG: 191 | Disposition: A | Discharge status: Home / Self Care | Payer: Medicare HMO | Attending: Family Medicine | Admitting: Family Medicine

## 2016-04-21 ENCOUNTER — Encounter (HOSPITAL_COMMUNITY): Payer: Self-pay | Admitting: *Deleted

## 2016-04-21 DIAGNOSIS — Z794 Long term (current) use of insulin: Secondary | ICD-10-CM

## 2016-04-21 DIAGNOSIS — I10 Essential (primary) hypertension: Secondary | ICD-10-CM | POA: Diagnosis present

## 2016-04-21 DIAGNOSIS — Z90710 Acquired absence of both cervix and uterus: Secondary | ICD-10-CM

## 2016-04-21 DIAGNOSIS — J209 Acute bronchitis, unspecified: Secondary | ICD-10-CM | POA: Diagnosis present

## 2016-04-21 DIAGNOSIS — Z961 Presence of intraocular lens: Secondary | ICD-10-CM | POA: Diagnosis present

## 2016-04-21 DIAGNOSIS — I110 Hypertensive heart disease with heart failure: Secondary | ICD-10-CM | POA: Diagnosis present

## 2016-04-21 DIAGNOSIS — G4733 Obstructive sleep apnea (adult) (pediatric): Secondary | ICD-10-CM | POA: Diagnosis present

## 2016-04-21 DIAGNOSIS — I5032 Chronic diastolic (congestive) heart failure: Secondary | ICD-10-CM | POA: Diagnosis present

## 2016-04-21 DIAGNOSIS — Z8249 Family history of ischemic heart disease and other diseases of the circulatory system: Secondary | ICD-10-CM

## 2016-04-21 DIAGNOSIS — Z7982 Long term (current) use of aspirin: Secondary | ICD-10-CM

## 2016-04-21 DIAGNOSIS — Z800 Family history of malignant neoplasm of digestive organs: Secondary | ICD-10-CM

## 2016-04-21 DIAGNOSIS — J440 Chronic obstructive pulmonary disease with acute lower respiratory infection: Secondary | ICD-10-CM | POA: Diagnosis present

## 2016-04-21 DIAGNOSIS — Z9114 Patient's other noncompliance with medication regimen: Secondary | ICD-10-CM

## 2016-04-21 DIAGNOSIS — I2510 Atherosclerotic heart disease of native coronary artery without angina pectoris: Secondary | ICD-10-CM | POA: Diagnosis present

## 2016-04-21 DIAGNOSIS — E119 Type 2 diabetes mellitus without complications: Secondary | ICD-10-CM | POA: Diagnosis present

## 2016-04-21 DIAGNOSIS — Z9119 Patient's noncompliance with other medical treatment and regimen: Secondary | ICD-10-CM

## 2016-04-21 DIAGNOSIS — M1990 Unspecified osteoarthritis, unspecified site: Secondary | ICD-10-CM | POA: Diagnosis present

## 2016-04-21 DIAGNOSIS — R531 Weakness: Secondary | ICD-10-CM

## 2016-04-21 DIAGNOSIS — J4541 Moderate persistent asthma with (acute) exacerbation: Secondary | ICD-10-CM | POA: Diagnosis present

## 2016-04-21 DIAGNOSIS — E785 Hyperlipidemia, unspecified: Secondary | ICD-10-CM | POA: Diagnosis present

## 2016-04-21 DIAGNOSIS — Z8673 Personal history of transient ischemic attack (TIA), and cerebral infarction without residual deficits: Secondary | ICD-10-CM

## 2016-04-21 DIAGNOSIS — E118 Type 2 diabetes mellitus with unspecified complications: Secondary | ICD-10-CM | POA: Diagnosis present

## 2016-04-21 DIAGNOSIS — E02 Subclinical iodine-deficiency hypothyroidism: Secondary | ICD-10-CM

## 2016-04-21 DIAGNOSIS — Z951 Presence of aortocoronary bypass graft: Secondary | ICD-10-CM

## 2016-04-21 DIAGNOSIS — I252 Old myocardial infarction: Secondary | ICD-10-CM

## 2016-04-21 DIAGNOSIS — Z9049 Acquired absence of other specified parts of digestive tract: Secondary | ICD-10-CM

## 2016-04-21 DIAGNOSIS — E039 Hypothyroidism, unspecified: Secondary | ICD-10-CM | POA: Diagnosis present

## 2016-04-21 DIAGNOSIS — J449 Chronic obstructive pulmonary disease, unspecified: Secondary | ICD-10-CM | POA: Diagnosis present

## 2016-04-21 DIAGNOSIS — J441 Chronic obstructive pulmonary disease with (acute) exacerbation: Principal | ICD-10-CM | POA: Diagnosis present

## 2016-04-21 DIAGNOSIS — Z91199 Patient's noncompliance with other medical treatment and regimen due to unspecified reason: Secondary | ICD-10-CM

## 2016-04-21 DIAGNOSIS — E669 Obesity, unspecified: Secondary | ICD-10-CM | POA: Diagnosis present

## 2016-04-21 DIAGNOSIS — Z955 Presence of coronary angioplasty implant and graft: Secondary | ICD-10-CM

## 2016-04-21 DIAGNOSIS — N390 Urinary tract infection, site not specified: Secondary | ICD-10-CM

## 2016-04-21 DIAGNOSIS — R0902 Hypoxemia: Secondary | ICD-10-CM | POA: Diagnosis present

## 2016-04-21 LAB — CBC WITH DIFFERENTIAL/PLATELET
BASOS PCT: 0 %
Basophils Absolute: 0 10*3/uL (ref 0.0–0.1)
EOS ABS: 0.3 10*3/uL (ref 0.0–0.7)
Eosinophils Relative: 2 %
HEMATOCRIT: 33.4 % — AB (ref 36.0–46.0)
HEMOGLOBIN: 9.6 g/dL — AB (ref 12.0–15.0)
LYMPHS ABS: 1.8 10*3/uL (ref 0.7–4.0)
Lymphocytes Relative: 12 %
MCH: 19.7 pg — AB (ref 26.0–34.0)
MCHC: 28.7 g/dL — AB (ref 30.0–36.0)
MCV: 68.4 fL — ABNORMAL LOW (ref 78.0–100.0)
MONO ABS: 0.9 10*3/uL (ref 0.1–1.0)
MONOS PCT: 6 %
NEUTROS ABS: 11.9 10*3/uL — AB (ref 1.7–7.7)
NEUTROS PCT: 80 %
Platelets: 305 10*3/uL (ref 150–400)
RBC: 4.88 MIL/uL (ref 3.87–5.11)
RDW: 18.9 % — AB (ref 11.5–15.5)
WBC: 14.8 10*3/uL — ABNORMAL HIGH (ref 4.0–10.5)

## 2016-04-21 MED ORDER — VANCOMYCIN HCL IN DEXTROSE 1-5 GM/200ML-% IV SOLN
1000.0000 mg | Freq: Once | INTRAVENOUS | Status: AC
  Administered 2016-04-22: 1000 mg via INTRAVENOUS
  Filled 2016-04-21: qty 200

## 2016-04-21 MED ORDER — SODIUM CHLORIDE 0.9 % IV BOLUS (SEPSIS)
1000.0000 mL | INTRAVENOUS | Status: AC
  Administered 2016-04-22 (×2): 1000 mL via INTRAVENOUS

## 2016-04-21 MED ORDER — METHYLPREDNISOLONE SODIUM SUCC 125 MG IJ SOLR
125.0000 mg | Freq: Once | INTRAMUSCULAR | Status: AC
  Administered 2016-04-22: 125 mg via INTRAVENOUS
  Filled 2016-04-21: qty 2

## 2016-04-21 MED ORDER — DEXTROSE 5 % IV SOLN
2.0000 g | Freq: Once | INTRAVENOUS | Status: AC
  Administered 2016-04-22: 2 g via INTRAVENOUS
  Filled 2016-04-21: qty 2

## 2016-04-21 NOTE — ED Provider Notes (Signed)
CSN: [id number]     Arrival date & time 04/21/16  2307 History  By signing my name below, I, [name] [name], attest that this documentation has been prepared under the direction and in the presence of [name] Linsey, MD at 23:40 PM   Electronically Signed: Iona [name], [name] 04/22/2016. 2:19 AM   Chief Complaint  Patient presents with  . Cough   The history is provided by the patient. No language interpreter was used.   HPI Comments: [name] [name] is a 80 y.o. female who presents to the Emergency Department complaining of gradual onset, productive cough with yellow phlegm, ongoing for two days. Pt reports chills, generalized body aches, central burning chest pain, headache, fever with tmax of 101.8 PTA, rhinorrhea with clear mucous, diarrhea x2 episodes earlier this morning, wheezing, and shortness of breath. No other associated symptoms noted. Chest pain is worsened with cough. Pt used nebulizer x3 today with minimal relief to symptoms. Shortness of breath is worsened with exertion. Pt took tylenol PTA with minimal relief to symptoms. No other worsening or alleviating factors noted. Pt denies sneezing, sore throat, leg swelling, nausea, vomiting, or any other pertinent symptoms. Pt does not smoke. [name] Pt was hospitalized recently for confusion stemming from UTI. She is NOT on oxygen at home  PCP is Dr. Janna Arch  Past Medical History  Diagnosis Date  . Arteriosclerotic cardiovascular disease (ASCVD) CARDIOLOGIST-   DR Dietrich Pates     CABG surgery in 2001; nl stress nuclear-2007; angiography in 01/2006- TO of LAD; 70% ostial stenosis      of a small ramus; patent grafts; normal EF; cath in 12/2009 essentially unchanged; EF-66%;  Congestive heart failure with preserved LV systolic function  . Hypertension   . Varicose veins   . Hypothyroidism   . Insulin dependent type 2 diabetes mellitus (HCC)   . Peripheral neuropathy (HCC)   . Diastolic CHF (HCC)   . Hyperlipidemia   . History  of CVA (cerebrovascular accident)     2013--  LACUNAR INFARCTIONS RIGHT THALAMUS---  residual left side of  lip numb  and left eye vision worse (which corrented lens implant from cataract extraction)  . Lesion of bladder   . Hematuria   . Arthritis   . DDD (degenerative disc disease), lumbar   . OSA (obstructive sleep apnea)   . Chronic obstructive pulmonary disease (HCC)     Chronic bronchitis  . History of MI (myocardial infarction)     07/ 2001   s/p  cabg x2  . S/P CABG x 2     07-02-2000  . SUI (stress urinary incontinence, female)   . Lower urinary tract symptoms (LUTS)   . Psoriasis     lower leg  . Thinning of skin    Past Surgical History  Procedure Laterality Date  . Rotator cuff repair Right 1995  . Lumbar spine surgery  x2    yrs ago  . Carpal tunnel release Bilateral yrs ago  . Orif humerus fracture Right 04-18-2008  . Knee arthroscopy Left 10-18-2003    SYNOVECTOMY  . Repair flexor tendon hand Right 09-20-2008    CARPI RADIALIS TENDON TRANSFER TO EXTENSOR TO INDEX, MIDDLE, RING , LITTLE FINGERS AND DIGITI MININI  . Cardiovascular stress test  08-28-2011   DR ROTHBART    NEGATIVE NUCLEAR STUDY/  GLOBAL LVSF/  EF 65%  . Cholecystectomy  1990  . Appendectomy  age 35  . Total abdominal hysterectomy w/ bilateral salpingoophorectomy  age 45  .  Cataract extraction w/ intraocular lens  implant, bilateral  2013  . Cardiac catheterization  1991    No sig. cad  . Coronary angioplasty  11-13-1999    balloon angioplasty to mLAD , d2 of LAD  . Coronary angioplasty with stent placement  01-17-2000    PCI stenting to mLAD & D2 of LAD  . Cardiac catheterization  07-01-2000    high grade in-stent restenosis  . Cardiac catheterization  04-30-2001    single native vessel cad/ graft patent  . Cardiac catheterization  02-13-2006  dr Juanda Chance    2V cad with total occluded LAD  and  70% ostial of small ramus/  grafts patent/  normal ef  . Cardiac catheterization  01-20-2000  dr  Clifton James    essentially unchanged;  severe single vessel cad with diffuse disease throughout CFX and RCA/ ef 66%;  chf with preserved LVSF  . Coronary artery bypass graft  07-02-2000   DR Tressie Stalker    SVG to diagonal, LIMA to LAD  . Transthoracic echocardiogram  12-12-2013    MILD LVH/  EF 60-65%/  GRADE I DIASTOLIC DYSFUNCTION/  MILD LAE  . Cystoscopy w/ retrogrades Bilateral 08/09/2014    Procedure: CYSTOSCOPY, BILATERAL RETROGRADE, HYDRODISTENSION, BLADDER BIOPSY WITH FULGERATION, INSTILL PYRIDIUM AND MARCAINE ;  Surgeon: Anner Crete, MD;  Location: [facility];  Service: Urology;  Laterality: Bilateral;  . Colonoscopy N/A 07/26/2015    Procedure: COLONOSCOPY;  Surgeon: Malissa Hippo, MD;  Location: [facility];  Service: Endoscopy;  Laterality: N/A;  1200   Family History  Problem Relation Age of Onset  . Coronary artery disease      Multiple first and second-degree relatives  . Aneurysm      Cerebral circulation  . Hypertension Mother   . Colon cancer Sister   . Clotting disorder      Children diagnosed with hypercoagulable state   Social History  Substance Use Topics  . Smoking status: Never Smoker   . Smokeless tobacco: Never Used  . Alcohol Use: No   Lives at home Lives with spouse  OB History    Gravida Para Term Preterm AB TAB SAB Ectopic Multiple Living   7 7 7       4      Review of Systems  Constitutional: Positive for fever and chills.  HENT: Positive for rhinorrhea. Negative for sneezing and sore throat.   Respiratory: Positive for cough, shortness of breath and wheezing.   Cardiovascular: Positive for chest pain. Negative for leg swelling.  Gastrointestinal: Positive for diarrhea. Negative for nausea and vomiting.  Musculoskeletal:       Generalized body aches.   Neurological: Positive for headaches.  All other systems reviewed and are negative.    Allergies  Codeine  Home Medications   Prior to Admission medications    Medication Sig Start Date End Date Taking? Authorizing Provider  albuterol (PROVENTIL) (2.5 MG/3ML) 0.083% nebulizer solution Take 3 mLs (2.5 mg total) by nebulization every 4 (four) hours as needed for wheezing or shortness of breath. 04/20/16   Bethann Berkshire, MD  aspirin EC 325 MG tablet Take 325 mg by mouth daily.    Historical Provider, MD  azithromycin (ZITHROMAX) 250 MG tablet Take one tablet a day.  Start tomorrow sunday 04/20/16   Bethann Berkshire, MD  Insulin Degludec (TRESIBA FLEXTOUCH) 200 UNIT/ML SOPN Inject 35-70 Units into the skin 2 (two) times daily. 35 units in the morning and 70 units in the  evening    Historical Provider, MD  levothyroxine (SYNTHROID, LEVOTHROID) 25 MCG tablet Take 25 mcg by mouth every morning.     Historical Provider, MD  lisinopril (PRINIVIL,ZESTRIL) 10 MG tablet Take 1 tablet by mouth every morning.  11/09/13   Historical Provider, MD  LORazepam (ATIVAN) 1 MG tablet Take 1 mg by mouth at bedtime. 03/29/16   Historical Provider, MD  metFORMIN (GLUCOPHAGE) 500 MG tablet Take 500 mg by mouth 2 (two) times daily with a meal.      Historical Provider, MD  nitroGLYCERIN (NITROSTAT) 0.4 MG SL tablet Place 0.4 mg under the tongue every 5 (five) minutes as needed for chest pain.     Historical Provider, MD  NOVOLIN 70/30 RELION (70-30) 100 UNIT/ML injection Inject 20 Units into the skin 3 (three) times daily before meals. 02/25/16   Historical Provider, MD  ondansetron (ZOFRAN ODT) 4 MG disintegrating tablet 4mg  ODT q4 hours prn nausea/vomit 04/20/16   Bethann BerkshireJoseph Zammit, MD  oxyCODONE-acetaminophen (PERCOCET) 7.5-325 MG tablet Take 1 tablet by mouth 4 (four) times daily as needed for moderate pain.  02/29/16   Historical Provider, MD  pravastatin (PRAVACHOL) 40 MG tablet Take 40 mg by mouth daily.    Historical Provider, MD  traMADol (ULTRAM) 50 MG tablet Take 1 tablet (50 mg total) by mouth every 6 (six) hours as needed. 04/04/16   Vanetta MuldersScott Zackowski, MD   BP 179/70 mmHg  Pulse 103   Temp(Src) 99.7 F (37.6 C) (Oral)  Resp 24  SpO2 86% on RA  Vital signs normal except for tachycardia, hypertension, hypoxia, and low-grade temp  Physical Exam  Constitutional: She is oriented to person, place, and time. She appears well-developed and well-nourished.  Non-toxic appearance. She does not appear ill. No distress.  HENT:  Head: Normocephalic and atraumatic.  Right Ear: External ear normal.  Left Ear: External ear normal.  Nose: Nose normal. No mucosal edema or rhinorrhea.  Mouth/Throat: Oropharynx is clear and moist. Mucous membranes are dry. No dental abscesses or uvula swelling.  Eyes: Conjunctivae and EOM are normal. Pupils are equal, round, and reactive to light.  Neck: Normal range of motion and full passive range of motion without pain. Neck supple.  Cardiovascular: Normal rate, regular rhythm and normal heart sounds.  Exam reveals no gallop and no friction rub.   No murmur heard. Pulmonary/Chest: Accessory muscle usage present. Tachypnea noted. She is in respiratory distress. She has decreased breath sounds. She has wheezes. She has no rhonchi. She has no rales. She exhibits no tenderness and no crepitus.  Inspiratory and expiratory wheezing.   Abdominal: Soft. Normal appearance and bowel sounds are normal. She exhibits no distension. There is no tenderness. There is no rebound and no guarding.  Musculoskeletal: Normal range of motion. She exhibits no edema or tenderness.  Moves all extremities well.   Neurological: She is alert and oriented to person, place, and time. She has normal strength. No cranial nerve deficit.  Skin: Skin is warm, dry and intact. No rash noted. No erythema. No pallor.  Psychiatric: She has a normal mood and affect. Her speech is normal and behavior is normal. Her mood appears not anxious.  Nursing note and vitals reviewed.   ED Course  Procedures (including critical care time)  Medications  sodium chloride 0.9 % bolus 1,000 mL (1,000 mLs  Intravenous New Bag/Given 04/22/16 0020)  ceFEPIme (MAXIPIME) 2 g in dextrose 5 % 50 mL IVPB (0 g Intravenous Stopped 04/22/16 0108)  vancomycin (VANCOCIN) IVPB  1000 mg/200 mL premix (1,000 mg Intravenous New Bag/Given 04/22/16 0018)  methylPREDNISolone sodium succinate (SOLU-MEDROL) 125 mg/2 mL injection 125 mg (125 mg Intravenous Given 04/22/16 0019)  oxyCODONE-acetaminophen (PERCOCET/ROXICET) 5-325 MG per tablet 1 tablet (1 tablet Oral Given 04/22/16 0118)  Albuterol continuous neb 10 mg/hr atrovent 0.5 mg neb  DIAGNOSTIC STUDIES: Oxygen Saturation is 96% on nasal canula, adequate by my interpretation.    COORDINATION OF CARE: 11:51 PM Discussed treatment plan which includes CXR, CMP, CBC with differential, troponin I, and EKG with pt at bedside and pt agreed to plan.  Patient was hypoxic in triage with her pulse ox being 86% on room air and she had nasal cannula oxygen placed with improvement of her oxygenation into the mid 90s.  Patient was started on sepsis protocol without calling code sepsis. She was in the hospital within the past month so she was started on healthcare associated pneumonia antibiotics. She was given IV Solu-Medrol.  Patient was rechecked at 1:20 AM she still has scattered wheezing. She was given a continuous nebulizer and Atrovent. We discussed her test results and she is agreeable to being admitted.   01:43 Dr Conley Rolls, hospitalist, will admit the pateint.    Labs Review Results for orders placed or performed during the hospital encounter of 04/21/16  Blood Culture (routine x 2)  Result Value Ref Range   Specimen Description BLOOD RIGHT HAND    Special Requests      BOTTLES DRAWN AEROBIC AND ANAEROBIC AEB 4CC ANA 2CC   Culture PENDING    Report Status PENDING   Blood Culture (routine x 2)  Result Value Ref Range   Specimen Description BLOOD LEFT HAND    Special Requests BOTTLES DRAWN AEROBIC ONLY 6CC    Culture PENDING    Report Status PENDING   Comprehensive  metabolic panel  Result Value Ref Range   Sodium 137 135 - 145 mmol/L   Potassium 4.1 3.5 - 5.1 mmol/L   Chloride 104 101 - 111 mmol/L   CO2 24 22 - 32 mmol/L   Glucose, Bld 277 (H) 65 - 99 mg/dL   BUN 16 6 - 20 mg/dL   Creatinine, Ser 5.36 0.44 - 1.00 mg/dL   Calcium 8.9 8.9 - 64.4 mg/dL   Total Protein 7.3 6.5 - 8.1 g/dL   Albumin 3.9 3.5 - 5.0 g/dL   AST 18 15 - 41 U/L   ALT 13 (L) 14 - 54 U/L   Alkaline Phosphatase 72 38 - 126 U/L   Total Bilirubin 0.4 0.3 - 1.2 mg/dL   GFR calc non Af Amer >60 >60 mL/min   GFR calc Af Amer >60 >60 mL/min   Anion gap 9 5 - 15  CBC WITH DIFFERENTIAL  Result Value Ref Range   WBC 14.8 (H) 4.0 - 10.5 K/uL   RBC 4.88 3.87 - 5.11 MIL/uL   Hemoglobin 9.6 (L) 12.0 - 15.0 g/dL   HCT 03.4 (L) 74.2 - 59.5 %   MCV 68.4 (L) 78.0 - 100.0 fL   MCH 19.7 (L) 26.0 - 34.0 pg   MCHC 28.7 (L) 30.0 - 36.0 g/dL   RDW 63.8 (H) 75.6 - 43.3 %   Platelets 305 150 - 400 K/uL   Neutrophils Relative % 80 %   Neutro Abs 11.9 (H) 1.7 - 7.7 K/uL   Lymphocytes Relative 12 %   Lymphs Abs 1.8 0.7 - 4.0 K/uL   Monocytes Relative 6 %   Monocytes Absolute 0.9 0.1 - 1.0  K/uL   Eosinophils Relative 2 %   Eosinophils Absolute 0.3 0.0 - 0.7 K/uL   Basophils Relative 0 %   Basophils Absolute 0.0 0.0 - 0.1 K/uL  Troponin I  Result Value Ref Range   Troponin I <0.03 <0.031 ng/mL  Lactic acid, plasma  Result Value Ref Range   Lactic Acid, Venous 2.4 (HH) 0.5 - 2.0 mmol/L  Brain natriuretic peptide  Result Value Ref Range   B Natriuretic Peptide 47.0 0.0 - 100.0 pg/mL   Laboratory interpretation all normal except elevated lactic acid, stable anemia, leukocytosis, hyperglycemia   Imaging Review Dg Chest 2 View  04/20/2016  CLINICAL DATA:  Patient with worsening cough and shortness of breath. EXAM: CHEST  2 VIEW COMPARISON:  Chest radiograph 04/04/2016. FINDINGS: Stable cardiac and mediastinal contours status post median sternotomy and CABG procedure. No consolidative  pulmonary opacity. No pleural effusion or pneumothorax. Thoracic spine degenerative changes. Cholecystectomy clips. Surgical hardware involving the right humerus. IMPRESSION: No acute cardiopulmonary process. Electronically Signed   By: Annia Belt M.D.   On: 04/20/2016 13:57   I have personally reviewed and evaluated these images and lab results as part of my medical decision-making.   EKG Interpretation   Date/Time:  Sunday April 21 2016 23:42:36 EDT Ventricular Rate:  99 PR Interval:  214 QRS Duration: 105 QT Interval:  357 QTC Calculation: 458 R Axis:   -63 Text Interpretation:  Sinus rhythm Borderline prolonged PR interval Left  anterior fascicular block Low voltage, precordial leads Probable  anteroseptal infarct, old Borderline ST depression, lateral leads ST  elevation, consider inferior injury No significant change since last  tracing 04 Apr 2016 Confirmed by Kaspian Muccio  MD-I, Ruddy Swire ([id number]) on 04/21/2016  11:49:57 PM      MDM   Final diagnoses:  Hypoxia  COPD exacerbation (HCC)   Plan admission  Devoria Albe, MD, FACEP  CRITICAL CARE Performed by: Brenae Lasecki L Greysen Swanton Total critical care time: 32 minutes Critical care time was exclusive of separately billable procedures and treating other patients. Critical care was necessary to treat or prevent imminent or life-threatening deterioration. Critical care was time spent personally by me on the following activities: development of treatment plan with patient and/or surrogate as well as nursing, discussions with consultants, evaluation of patient's response to treatment, examination of patient, obtaining history from patient or surrogate, ordering and performing treatments and interventions, ordering and review of laboratory studies, ordering and review of radiographic studies, pulse oximetry and re-evaluation of patient's condition.    I personally performed the services described in this documentation, which was scribed in my presence.  The recorded information has been reviewed and considered.  Devoria Albe, MD, Concha Pyo, MD 04/22/16 [phone]

## 2016-04-21 NOTE — ED Notes (Signed)
Pt in radiology 

## 2016-04-21 NOTE — ED Notes (Signed)
Pt c/o cough, chest congestion, fever, sob that started Friday, was seen in er yesterday, diagnosed with bronchitis, states that she is not feeling any better, had fever of 101.8 prior to arrival in er, pt did take tylenol for the fever, fever on arrival to er 99.7, pulse ox 86% on RA<

## 2016-04-21 NOTE — ED Notes (Signed)
Dr Knapp in to assess 

## 2016-04-21 NOTE — ED Notes (Signed)
Pt here 2 weeks ago for congestion given antibiotics- states has not felt better, nor has been able to follow with Wilhemina Bonitoon Deigo. She also reports that she lives in a household where there is a smoker. She presents with a wet cough- IV established, labs drawn, spouse at bedside

## 2016-04-21 NOTE — ED Notes (Signed)
Lab called regarding added labs and blood culture orders

## 2016-04-22 ENCOUNTER — Encounter (HOSPITAL_COMMUNITY): Payer: Self-pay | Admitting: Emergency Medicine

## 2016-04-22 DIAGNOSIS — R0902 Hypoxemia: Secondary | ICD-10-CM | POA: Diagnosis not present

## 2016-04-22 DIAGNOSIS — Z794 Long term (current) use of insulin: Secondary | ICD-10-CM | POA: Diagnosis not present

## 2016-04-22 DIAGNOSIS — Z8673 Personal history of transient ischemic attack (TIA), and cerebral infarction without residual deficits: Secondary | ICD-10-CM | POA: Diagnosis not present

## 2016-04-22 DIAGNOSIS — Z7982 Long term (current) use of aspirin: Secondary | ICD-10-CM | POA: Diagnosis not present

## 2016-04-22 DIAGNOSIS — E669 Obesity, unspecified: Secondary | ICD-10-CM | POA: Diagnosis present

## 2016-04-22 DIAGNOSIS — G4733 Obstructive sleep apnea (adult) (pediatric): Secondary | ICD-10-CM | POA: Diagnosis present

## 2016-04-22 DIAGNOSIS — J441 Chronic obstructive pulmonary disease with (acute) exacerbation: Principal | ICD-10-CM

## 2016-04-22 DIAGNOSIS — I10 Essential (primary) hypertension: Secondary | ICD-10-CM

## 2016-04-22 DIAGNOSIS — I2510 Atherosclerotic heart disease of native coronary artery without angina pectoris: Secondary | ICD-10-CM | POA: Diagnosis present

## 2016-04-22 DIAGNOSIS — J209 Acute bronchitis, unspecified: Secondary | ICD-10-CM | POA: Diagnosis present

## 2016-04-22 DIAGNOSIS — I252 Old myocardial infarction: Secondary | ICD-10-CM | POA: Diagnosis not present

## 2016-04-22 DIAGNOSIS — M1990 Unspecified osteoarthritis, unspecified site: Secondary | ICD-10-CM | POA: Diagnosis present

## 2016-04-22 DIAGNOSIS — I110 Hypertensive heart disease with heart failure: Secondary | ICD-10-CM | POA: Diagnosis present

## 2016-04-22 DIAGNOSIS — E785 Hyperlipidemia, unspecified: Secondary | ICD-10-CM | POA: Diagnosis present

## 2016-04-22 DIAGNOSIS — J4541 Moderate persistent asthma with (acute) exacerbation: Secondary | ICD-10-CM | POA: Diagnosis present

## 2016-04-22 DIAGNOSIS — J440 Chronic obstructive pulmonary disease with acute lower respiratory infection: Secondary | ICD-10-CM | POA: Diagnosis present

## 2016-04-22 DIAGNOSIS — I5032 Chronic diastolic (congestive) heart failure: Secondary | ICD-10-CM | POA: Diagnosis present

## 2016-04-22 DIAGNOSIS — Z951 Presence of aortocoronary bypass graft: Secondary | ICD-10-CM | POA: Diagnosis not present

## 2016-04-22 DIAGNOSIS — Z961 Presence of intraocular lens: Secondary | ICD-10-CM | POA: Diagnosis present

## 2016-04-22 DIAGNOSIS — Z9119 Patient's noncompliance with other medical treatment and regimen: Secondary | ICD-10-CM | POA: Diagnosis not present

## 2016-04-22 DIAGNOSIS — Z90710 Acquired absence of both cervix and uterus: Secondary | ICD-10-CM | POA: Diagnosis not present

## 2016-04-22 DIAGNOSIS — E119 Type 2 diabetes mellitus without complications: Secondary | ICD-10-CM | POA: Diagnosis present

## 2016-04-22 DIAGNOSIS — Z8249 Family history of ischemic heart disease and other diseases of the circulatory system: Secondary | ICD-10-CM | POA: Diagnosis not present

## 2016-04-22 DIAGNOSIS — Z955 Presence of coronary angioplasty implant and graft: Secondary | ICD-10-CM | POA: Diagnosis not present

## 2016-04-22 DIAGNOSIS — Z9049 Acquired absence of other specified parts of digestive tract: Secondary | ICD-10-CM | POA: Diagnosis not present

## 2016-04-22 DIAGNOSIS — E039 Hypothyroidism, unspecified: Secondary | ICD-10-CM | POA: Diagnosis present

## 2016-04-22 DIAGNOSIS — Z800 Family history of malignant neoplasm of digestive organs: Secondary | ICD-10-CM | POA: Diagnosis not present

## 2016-04-22 LAB — COMPREHENSIVE METABOLIC PANEL
ALT: 13 U/L — AB (ref 14–54)
ANION GAP: 9 (ref 5–15)
AST: 18 U/L (ref 15–41)
Albumin: 3.9 g/dL (ref 3.5–5.0)
Alkaline Phosphatase: 72 U/L (ref 38–126)
BUN: 16 mg/dL (ref 6–20)
CHLORIDE: 104 mmol/L (ref 101–111)
CO2: 24 mmol/L (ref 22–32)
CREATININE: 0.89 mg/dL (ref 0.44–1.00)
Calcium: 8.9 mg/dL (ref 8.9–10.3)
Glucose, Bld: 277 mg/dL — ABNORMAL HIGH (ref 65–99)
POTASSIUM: 4.1 mmol/L (ref 3.5–5.1)
SODIUM: 137 mmol/L (ref 135–145)
Total Bilirubin: 0.4 mg/dL (ref 0.3–1.2)
Total Protein: 7.3 g/dL (ref 6.5–8.1)

## 2016-04-22 LAB — TSH: TSH: 0.243 u[IU]/mL — ABNORMAL LOW (ref 0.350–4.500)

## 2016-04-22 LAB — URINALYSIS, ROUTINE W REFLEX MICROSCOPIC
BILIRUBIN URINE: NEGATIVE
Glucose, UA: >1000 mg/dL — AB
Hgb urine dipstick: NEGATIVE
Ketones, ur: NEGATIVE mg/dL
Leukocytes, UA: NEGATIVE
NITRITE: NEGATIVE
PROTEIN: NEGATIVE mg/dL
SPECIFIC GRAVITY, URINE: 1.02 (ref 1.005–1.030)
pH: 5 (ref 5.0–8.0)

## 2016-04-22 LAB — GLUCOSE, CAPILLARY
GLUCOSE-CAPILLARY: 227 mg/dL — AB (ref 65–99)
Glucose-Capillary: 308 mg/dL — ABNORMAL HIGH (ref 65–99)
Glucose-Capillary: 290 mg/dL — ABNORMAL HIGH (ref 65–99)

## 2016-04-22 LAB — LACTIC ACID, PLASMA
LACTIC ACID, VENOUS: 2.4 mmol/L — AB (ref 0.5–2.0)
LACTIC ACID, VENOUS: 2.6 mmol/L — AB (ref 0.5–2.0)

## 2016-04-22 LAB — CBC
HCT: 33 % — ABNORMAL LOW (ref 36.0–46.0)
Hemoglobin: 9.5 g/dL — ABNORMAL LOW (ref 12.0–15.0)
MCH: 20 pg — ABNORMAL LOW (ref 26.0–34.0)
MCHC: 28.8 g/dL — ABNORMAL LOW (ref 30.0–36.0)
MCV: 69.5 fL — ABNORMAL LOW (ref 78.0–100.0)
PLATELETS: 287 10*3/uL (ref 150–400)
RBC: 4.75 MIL/uL (ref 3.87–5.11)
RDW: 19 % — AB (ref 11.5–15.5)
WBC: 26.8 10*3/uL — AB (ref 4.0–10.5)

## 2016-04-22 LAB — BASIC METABOLIC PANEL
Anion gap: 12 (ref 5–15)
BUN: 12 mg/dL (ref 6–20)
CHLORIDE: 108 mmol/L (ref 101–111)
CO2: 19 mmol/L — ABNORMAL LOW (ref 22–32)
CREATININE: 0.79 mg/dL (ref 0.44–1.00)
Calcium: 8.8 mg/dL — ABNORMAL LOW (ref 8.9–10.3)
Glucose, Bld: 300 mg/dL — ABNORMAL HIGH (ref 65–99)
POTASSIUM: 3.7 mmol/L (ref 3.5–5.1)
SODIUM: 139 mmol/L (ref 135–145)

## 2016-04-22 LAB — URINE MICROSCOPIC-ADD ON

## 2016-04-22 LAB — TROPONIN I

## 2016-04-22 LAB — BRAIN NATRIURETIC PEPTIDE: B NATRIURETIC PEPTIDE 5: 47 pg/mL (ref 0.0–100.0)

## 2016-04-22 MED ORDER — ALBUTEROL (5 MG/ML) CONTINUOUS INHALATION SOLN
10.0000 mg/h | INHALATION_SOLUTION | Freq: Once | RESPIRATORY_TRACT | Status: AC
  Administered 2016-04-22: 10 mg/h via RESPIRATORY_TRACT
  Filled 2016-04-22: qty 20

## 2016-04-22 MED ORDER — LEVOFLOXACIN IN D5W 750 MG/150ML IV SOLN
750.0000 mg | INTRAVENOUS | Status: DC
  Administered 2016-04-22 – 2016-04-25 (×4): 750 mg via INTRAVENOUS
  Filled 2016-04-22 (×3): qty 150

## 2016-04-22 MED ORDER — ACETAMINOPHEN 325 MG PO TABS: 650.0000 mg | ORAL_TABLET | ORAL | Status: DC | PRN

## 2016-04-22 MED ORDER — METHYLPREDNISOLONE SODIUM SUCC 40 MG IJ SOLR
40.0000 mg | Freq: Four times a day (QID) | INTRAMUSCULAR | Status: DC
  Administered 2016-04-22 – 2016-04-25 (×14): 40 mg via INTRAVENOUS
  Filled 2016-04-22 (×14): qty 1

## 2016-04-22 MED ORDER — ALBUTEROL SULFATE (2.5 MG/3ML) 0.083% IN NEBU
2.5000 mg | INHALATION_SOLUTION | Freq: Four times a day (QID) | RESPIRATORY_TRACT | Status: DC
  Administered 2016-04-22 – 2016-04-23 (×6): 2.5 mg via RESPIRATORY_TRACT
  Filled 2016-04-22 (×6): qty 3

## 2016-04-22 MED ORDER — IPRATROPIUM BROMIDE 0.02 % IN SOLN
0.5000 mg | Freq: Once | RESPIRATORY_TRACT | Status: AC
  Administered 2016-04-22: 0.5 mg via RESPIRATORY_TRACT
  Filled 2016-04-22: qty 2.5

## 2016-04-22 MED ORDER — INSULIN GLARGINE 100 UNIT/ML ~~LOC~~ SOLN
20.0000 [IU] | Freq: Two times a day (BID) | SUBCUTANEOUS | Status: DC
  Administered 2016-04-22 – 2016-04-25 (×8): 20 [IU] via SUBCUTANEOUS
  Filled 2016-04-22 (×10): qty 0.2

## 2016-04-22 MED ORDER — LEVOFLOXACIN IN D5W 750 MG/150ML IV SOLN
INTRAVENOUS | Status: AC
  Filled 2016-04-22: qty 150

## 2016-04-22 MED ORDER — PRAVASTATIN SODIUM 40 MG PO TABS
40.0000 mg | ORAL_TABLET | Freq: Every day | ORAL | Status: DC
Start: 2016-04-22 — End: 2016-04-25
  Administered 2016-04-22 – 2016-04-25 (×4): 40 mg via ORAL
  Filled 2016-04-22 (×4): qty 1

## 2016-04-22 MED ORDER — LEVOTHYROXINE SODIUM 25 MCG PO TABS
25.0000 ug | ORAL_TABLET | Freq: Every day | ORAL | Status: DC
  Administered 2016-04-22 – 2016-04-25 (×4): 25 ug via ORAL
  Filled 2016-04-22 (×3): qty 1

## 2016-04-22 MED ORDER — LISINOPRIL 10 MG PO TABS
10.0000 mg | ORAL_TABLET | Freq: Every morning | ORAL | Status: DC
  Administered 2016-04-22 – 2016-04-25 (×4): 10 mg via ORAL
  Filled 2016-04-22 (×4): qty 1

## 2016-04-22 MED ORDER — ONDANSETRON HCL 4 MG/2ML IJ SOLN
4.0000 mg | Freq: Four times a day (QID) | INTRAMUSCULAR | Status: DC | PRN
  Administered 2016-04-22: 4 mg via INTRAVENOUS
  Filled 2016-04-22: qty 2

## 2016-04-22 MED ORDER — OXYCODONE-ACETAMINOPHEN 7.5-325 MG PO TABS
1.0000 | ORAL_TABLET | Freq: Four times a day (QID) | ORAL | Status: DC | PRN
  Administered 2016-04-22 – 2016-04-24 (×6): 1 via ORAL
  Filled 2016-04-22 (×6): qty 1

## 2016-04-22 MED ORDER — OXYCODONE-ACETAMINOPHEN 5-325 MG PO TABS
1.0000 | ORAL_TABLET | Freq: Once | ORAL | Status: AC
  Administered 2016-04-22: 1 via ORAL
  Filled 2016-04-22: qty 1

## 2016-04-22 MED ORDER — ASPIRIN EC 325 MG PO TBEC
325.0000 mg | DELAYED_RELEASE_TABLET | Freq: Every day | ORAL | Status: DC
  Administered 2016-04-22 – 2016-04-25 (×4): 325 mg via ORAL
  Filled 2016-04-22 (×4): qty 1

## 2016-04-22 MED ORDER — METFORMIN HCL 500 MG PO TABS
500.0000 mg | ORAL_TABLET | Freq: Two times a day (BID) | ORAL | Status: DC
  Administered 2016-04-22: 500 mg via ORAL
  Filled 2016-04-22: qty 1

## 2016-04-22 MED ORDER — ALBUTEROL SULFATE (2.5 MG/3ML) 0.083% IN NEBU: 2.5000 mg | INHALATION_SOLUTION | RESPIRATORY_TRACT | Status: DC | PRN

## 2016-04-22 MED ORDER — SODIUM CHLORIDE 0.9 % IV SOLN
INTRAVENOUS | Status: DC
  Administered 2016-04-22 – 2016-04-25 (×4): via INTRAVENOUS

## 2016-04-22 MED ORDER — ENOXAPARIN SODIUM 40 MG/0.4ML ~~LOC~~ SOLN
40.0000 mg | SUBCUTANEOUS | Status: DC
  Administered 2016-04-22 – 2016-04-25 (×4): 40 mg via SUBCUTANEOUS
  Filled 2016-04-22 (×4): qty 0.4

## 2016-04-22 MED ORDER — INSULIN ASPART 100 UNIT/ML ~~LOC~~ SOLN
4.0000 [IU] | Freq: Three times a day (TID) | SUBCUTANEOUS | Status: DC
Start: 2016-04-22 — End: 2016-04-25
  Administered 2016-04-22 – 2016-04-25 (×11): 4 [IU] via SUBCUTANEOUS

## 2016-04-22 MED ORDER — LORAZEPAM 1 MG PO TABS
1.0000 mg | ORAL_TABLET | Freq: Every day | ORAL | Status: DC
  Administered 2016-04-22 – 2016-04-23 (×2): 1 mg via ORAL
  Filled 2016-04-22 (×2): qty 1

## 2016-04-22 MED ORDER — SENNA 8.6 MG PO TABS
1.0000 | ORAL_TABLET | Freq: Two times a day (BID) | ORAL | Status: DC
  Administered 2016-04-22 – 2016-04-25 (×8): 8.6 mg via ORAL
  Filled 2016-04-22 (×8): qty 1

## 2016-04-22 MED ORDER — INSULIN ASPART 100 UNIT/ML ~~LOC~~ SOLN
0.0000 [IU] | Freq: Three times a day (TID) | SUBCUTANEOUS | Status: DC
  Administered 2016-04-22: 11 [IU] via SUBCUTANEOUS
  Administered 2016-04-22: 15 [IU] via SUBCUTANEOUS
  Administered 2016-04-22 – 2016-04-23 (×2): 7 [IU] via SUBCUTANEOUS
  Administered 2016-04-23 (×2): 11 [IU] via SUBCUTANEOUS
  Administered 2016-04-24: 15 [IU] via SUBCUTANEOUS
  Administered 2016-04-24: 20 [IU] via SUBCUTANEOUS
  Administered 2016-04-24: 4 [IU] via SUBCUTANEOUS
  Administered 2016-04-25: 11 [IU] via SUBCUTANEOUS
  Administered 2016-04-25: 7 [IU] via SUBCUTANEOUS

## 2016-04-22 MED ORDER — ALBUTEROL SULFATE (2.5 MG/3ML) 0.083% IN NEBU: 2.5000 mg | INHALATION_SOLUTION | Freq: Four times a day (QID) | RESPIRATORY_TRACT | Status: DC

## 2016-04-22 MED ORDER — INSULIN ASPART 100 UNIT/ML ~~LOC~~ SOLN
0.0000 [IU] | Freq: Every day | SUBCUTANEOUS | Status: DC
  Administered 2016-04-23: 3 [IU] via SUBCUTANEOUS
  Administered 2016-04-24: 2 [IU] via SUBCUTANEOUS

## 2016-04-22 MED ORDER — ONDANSETRON HCL 4 MG PO TABS: 4.0000 mg | ORAL_TABLET | Freq: Four times a day (QID) | ORAL | Status: DC | PRN

## 2016-04-22 NOTE — Progress Notes (Signed)
Patient has just summation COPD clear chest x-ray on IV Levaquin empirically IV Solu-Medrol and DuoNeb nebulizer likewise has increased insulin requirements due to steroids Ree Alcalde Kinney ZOX:[id number] DOB: May 22, 1936 DOA: 2020/03/1516 PCP: [name], MD             Physical Exam: Blood pressure 132/54, pulse 83, temperature 97.8 F (36.6 C), temperature source Oral, resp. rate 20, height  (1.626 [name]), weight 205 lb 4 oz (93.1 kg), SpO2 98 %. lungs show mild end expiratory wheeze scattered rhonchi diminished breath sounds in the bases no rales audible heart regular rhythm no S3 or S4 no heaves thrills rubs abdomen soft nontender bowel sounds normoactive   Investigations:  Recent Results (from the past 240 hour(s))  Blood Culture (routine x 2)     Status: None (Preliminary result)   Collection Time: 04/22/16 12:22 AM  Result Value Ref Range Status   Specimen Description BLOOD RIGHT HAND  Final   Special Requests   Final    BOTTLES DRAWN AEROBIC AND ANAEROBIC AEB 4CC ANA 2CC   Culture NO GROWTH < 12 HOURS  Final   Report Status PENDING  Incomplete  Blood Culture (routine x 2)     Status: None (Preliminary result)   Collection Time: 04/22/16 12:27 AM  Result Value Ref Range Status   Specimen Description BLOOD LEFT HAND  Final   Special Requests BOTTLES DRAWN AEROBIC ONLY 6CC  Final   Culture NO GROWTH < 12 HOURS  Final   Report Status PENDING  Incomplete     Basic Metabolic Panel:  Recent Labs  81/19/14 2335 04/22/16 0446  NA 137 139  K 4.1 3.7  CL 104 108  CO2 24 19*  GLUCOSE 277* 300*  BUN 16 12  CREATININE 0.89 0.79  CALCIUM 8.9 8.8*   Liver Function Tests:  Recent Labs  04/20/16 1340 04/21/16 2335  AST 18 18  ALT 13* 13*  ALKPHOS 76 72  BILITOT 0.4 0.4  PROT 6.9 7.3  ALBUMIN 3.7 3.9     CBC:  Recent Labs  04/20/16 1340 04/21/16 2335 04/22/16 0446  WBC 8.5 14.8* 26.8*  NEUTROABS 5.8 11.9*  --   HGB 9.7* 9.6* 9.5*  HCT 34.2*  33.4* 33.0*  MCV 68.4* 68.4* 69.5*  PLT 294 305 287    Dg Chest 2 View  04/22/2016  CLINICAL DATA:  Shortness of breath. Patient was here 2 weeks ago for congestion in was given antibiotics without relief. Cough. EXAM: CHEST  2 VIEW COMPARISON:  04/20/2016 FINDINGS: Postoperative changes in the mediastinum. Shallow inspiration. Atelectasis in the lung bases. Normal heart size and pulmonary vascularity. No focal airspace disease or consolidation. No blunting of costophrenic angles. No pneumothorax. Degenerative changes in the spine. IMPRESSION: Shallow inspiration with atelectasis in the lung bases. Electronically Signed   By: Burman Nieves [name].D.   On: 04/22/2016 00:09   Dg Chest 2 View  04/20/2016  CLINICAL DATA:  Patient with worsening cough and shortness of breath. EXAM: CHEST  2 VIEW COMPARISON:  Chest radiograph 04/04/2016. FINDINGS: Stable cardiac and mediastinal contours status post median sternotomy and CABG procedure. No consolidative pulmonary opacity. No pleural effusion or pneumothorax. Thoracic spine degenerative changes. Cholecystectomy clips. Surgical hardware involving the right humerus. IMPRESSION: No acute cardiopulmonary process. Electronically Signed   By: Annia Belt [name].D.   On: 04/20/2016 13:57      Medications:   Impression:  Principal Problem:   Chronic obstructive pulmonary disease (HCC) Active Problems:  Hypothyroidism   DM type 2 causing complication (HCC)   Obesity   Essential hypertension   Noncompliance with CPAP treatment   Hypoxia   COPD exacerbation (HCC)   OSA (obstructive sleep apnea)     Plan: CBC and lactic acid in a.[name]. B med daily to new IV Levaquin Solu-Medrol and nebulizer therapy   Consultants:    Procedures   Antibiotics: IV Levaquin                  Code Status: Full  Family Communication:    Disposition Plan   Time spent: 30 minutes   LOS: 0 days   [name] [name]   04/22/2016, 1:08 PM

## 2016-04-22 NOTE — H&P (Signed)
History and Physical    [name] [name] ZOX:[id number] DOB: 1936-07-02 DOA: 2020/01/2016  Referring MD/NP/PA: Devoria Albe PCP: [name], MD  Outpatient Specialists:   Endocrinology; Roma Kayser, MD  Cardiology; Kathlen Brunswick, MD  Patient coming from: home  Chief Complaint: cough and wheezing.   HPI: [name] [name] is a 80 y.o. female with medical history significant of hypertension, hypothyroidism, insulin dependent DM type 2, diastolic CHF, hyperlipidemia, and a hx of CVA, presented with complaints of gradual onset, cough with production of yellow phlegm onset 2 days ago. She reports that her chest pain is worsened with cough. She also reports generalized body aches, headache, fever with T up to 102, rhinorrhea, diarrhea, wheezing, burning, central chest pain, and SOB. She used a nebulizer x3 today with only minimal relief of symptoms. She took tylenol PTA, but it did not relieve her sx. Her SOB is worsened with exertion. She denies any leg swelling, or nausea.  She lives with her husband and son. She reports that her son smokes, though not in her presence. She does not smoke. She has sleep apnea, but does not wear home O2.  ED Course: While in the ED, workup showed her glucose was elevated at 277. BNP and troponin were unremarkable. Her lactic acid was elevated to 2.4. WBC was elevated to 14.8. CXR was negative. Blood cx were ordered. Hospitalist was asked to refer for admission for COPD exacerbation.   Review of Systems: As per HPI otherwise 10 point review of systems negative.    Past Medical History  Diagnosis Date  . Arteriosclerotic cardiovascular disease (ASCVD) CARDIOLOGIST-   DR Dietrich Pates     CABG surgery in 2001; nl stress nuclear-2007; angiography in 01/2006- TO of LAD; 70% ostial stenosis      of a small ramus; patent grafts; normal EF; cath in 12/2009 essentially unchanged; EF-66%;  Congestive heart failure with preserved LV systolic function  .  Hypertension   . Varicose veins   . Hypothyroidism   . Insulin dependent type 2 diabetes mellitus (HCC)   . Peripheral neuropathy (HCC)   . Diastolic CHF (HCC)   . Hyperlipidemia   . History of CVA (cerebrovascular accident)     2013--  LACUNAR INFARCTIONS RIGHT THALAMUS---  residual left side of  lip numb  and left eye vision worse (which corrented lens implant from cataract extraction)  . Lesion of bladder   . Hematuria   . Arthritis   . DDD (degenerative disc disease), lumbar   . OSA (obstructive sleep apnea)   . Chronic obstructive pulmonary disease (HCC)     Chronic bronchitis  . History of MI (myocardial infarction)     07/ 2001   s/p  cabg x2  . S/P CABG x 2     07-02-2000  . SUI (stress urinary incontinence, female)   . Lower urinary tract symptoms (LUTS)   . Psoriasis     lower leg  . Thinning of skin     Past Surgical History  Procedure Laterality Date  . Rotator cuff repair Right 1995  . Lumbar spine surgery  x2    yrs ago  . Carpal tunnel release Bilateral yrs ago  . Orif humerus fracture Right 04-18-2008  . Knee arthroscopy Left 10-18-2003    SYNOVECTOMY  . Repair flexor tendon hand Right 09-20-2008    CARPI RADIALIS TENDON TRANSFER TO EXTENSOR TO INDEX, MIDDLE, RING , LITTLE FINGERS AND DIGITI MININI  . Cardiovascular stress test  08-28-2011  DR Dietrich Pates    NEGATIVE NUCLEAR STUDY/  GLOBAL LVSF/  EF 65%  . Cholecystectomy  1990  . Appendectomy  age 88  . Total abdominal hysterectomy w/ bilateral salpingoophorectomy  age 66  . Cataract extraction w/ intraocular lens  implant, bilateral  2013  . Cardiac catheterization  1991    No sig. cad  . Coronary angioplasty  11-13-1999    balloon angioplasty to mLAD , d2 of LAD  . Coronary angioplasty with stent placement  01-17-2000    PCI stenting to mLAD & D2 of LAD  . Cardiac catheterization  07-01-2000    high grade in-stent restenosis  . Cardiac catheterization  04-30-2001    single native vessel cad/ graft  patent  . Cardiac catheterization  02-13-2006  dr Juanda Chance    2V cad with total occluded LAD  and  70% ostial of small ramus/  grafts patent/  normal ef  . Cardiac catheterization  01-20-2000  dr Clifton James    essentially unchanged;  severe single vessel cad with diffuse disease throughout CFX and RCA/ ef 66%;  chf with preserved LVSF  . Coronary artery bypass graft  07-02-2000   DR Tressie Stalker    SVG to diagonal, LIMA to LAD  . Transthoracic echocardiogram  12-12-2013    MILD LVH/  EF 60-65%/  GRADE I DIASTOLIC DYSFUNCTION/  MILD LAE  . Cystoscopy w/ retrogrades Bilateral 08/09/2014    Procedure: CYSTOSCOPY, BILATERAL RETROGRADE, HYDRODISTENSION, BLADDER BIOPSY WITH FULGERATION, INSTILL PYRIDIUM AND MARCAINE ;  Surgeon: Anner Crete, MD;  Location: [facility]  Service: Urology;  Laterality: Bilateral;  . Colonoscopy N/A 07/26/2015    Procedure: COLONOSCOPY;  Surgeon: Malissa Hippo, MD;  Location: [facility];  Service: Endoscopy;  Laterality: N/A;  1200     reports that she has never smoked. She has never used smokeless tobacco. She reports that she does not drink alcohol or use illicit drugs.  Allergies  Allergen Reactions  . Codeine Itching    Family History  Problem Relation Age of Onset  . Coronary artery disease      Multiple first and second-degree relatives  . Aneurysm      Cerebral circulation  . Hypertension Mother   . Colon cancer Sister   . Clotting disorder      Children diagnosed with hypercoagulable state    Prior to Admission medications   Medication Sig Start Date End Date Taking? Authorizing Provider  albuterol (PROVENTIL) (2.5 MG/3ML) 0.083% nebulizer solution Take 3 mLs (2.5 mg total) by nebulization every 4 (four) hours as needed for wheezing or shortness of breath. 04/20/16   Bethann Berkshire, MD  aspirin EC 325 MG tablet Take 325 mg by mouth daily.    Historical Provider, MD  azithromycin (ZITHROMAX) 250 MG tablet Take one tablet a day.  Start  tomorrow sunday 04/20/16   Bethann Berkshire, MD  Insulin Degludec (TRESIBA FLEXTOUCH) 200 UNIT/ML SOPN Inject 35-70 Units into the skin 2 (two) times daily. 35 units in the morning and 70 units in the evening    Historical Provider, MD  levothyroxine (SYNTHROID, LEVOTHROID) 25 MCG tablet Take 25 mcg by mouth every morning.     Historical Provider, MD  lisinopril (PRINIVIL,ZESTRIL) 10 MG tablet Take 1 tablet by mouth every morning.  11/09/13   Historical Provider, MD  LORazepam (ATIVAN) 1 MG tablet Take 1 mg by mouth at bedtime. 03/29/16   Historical Provider, MD  metFORMIN (GLUCOPHAGE) 500 MG tablet Take 500 mg  by mouth 2 (two) times daily with a meal.      Historical Provider, MD  nitroGLYCERIN (NITROSTAT) 0.4 MG SL tablet Place 0.4 mg under the tongue every 5 (five) minutes as needed for chest pain.     Historical Provider, MD  NOVOLIN 70/30 RELION (70-30) 100 UNIT/ML injection Inject 20 Units into the skin 3 (three) times daily before meals. 02/25/16   Historical Provider, MD  ondansetron (ZOFRAN ODT) 4 MG disintegrating tablet 4mg  ODT q4 hours prn nausea/vomit 04/20/16   Bethann BerkshireJoseph Zammit, MD  oxyCODONE-acetaminophen (PERCOCET) 7.5-325 MG tablet Take 1 tablet by mouth 4 (four) times daily as needed for moderate pain.  02/29/16   Historical Provider, MD  pravastatin (PRAVACHOL) 40 MG tablet Take 40 mg by mouth daily.    Historical Provider, MD  traMADol (ULTRAM) 50 MG tablet Take 1 tablet (50 mg total) by mouth every 6 (six) hours as needed. 04/04/16   Vanetta MuldersScott Zackowski, MD    Physical Exam: Filed Vitals:   04/22/16 0030 04/22/16 0040 04/22/16 0041 04/22/16 0057  BP:  145/85 145/85 145/85  Pulse: 96 96 95 97  Temp:    98.5 F (36.9 C)  TempSrc:    Oral  Resp: 17 18 17 18   Height:      Weight:      SpO2: 100% 99% 99% 98%     Constitutional: NAD, calm, comfortable Filed Vitals:   04/22/16 0030 04/22/16 0040 04/22/16 0041 04/22/16 0057  BP:  145/85 145/85 145/85  Pulse: 96 96 95 97  Temp:    98.5 F  (36.9 C)  TempSrc:    Oral  Resp: 17 18 17 18   Height:      Weight:      SpO2: 100% 99% 99% 98%   Eyes: PERRL, lids and conjunctivae normal ENMT: Mucous membranes are moist. Posterior pharynx clear of any exudate or lesions.Normal dentition.  Neck: normal, supple, no masses, no thyromegaly Respiratory: with bilateral wheezing,  no crackles. Normal respiratory effort. No accessory muscle use. Cardiovascular: Regular rate and rhythm, no murmurs / rubs / gallops. No extremity edema. 2+ pedal pulses. No carotid bruits.  Abdomen: no tenderness, no masses palpated. No hepatosplenomegaly. Bowel sounds positive.  Musculoskeletal: no clubbing / cyanosis. No joint deformity upper and lower extremities. Good ROM, no contractures. Normal muscle tone.  Skin: no rashes, lesions, ulcers. No induration Neurologic: CN 2-12 grossly intact. Sensation intact, DTR normal. Strength 5/5 in all 4.  Psychiatric: Normal judgment and insight. Alert and oriented x 3. Normal mood.    Labs on Admission: I have personally reviewed following labs and imaging studies  CBC:  Recent Labs Lab 04/20/16 1340 04/21/16 2335  WBC 8.5 14.8*  NEUTROABS 5.8 11.9*  HGB 9.7* 9.6*  HCT 34.2* 33.4*  MCV 68.4* 68.4*  PLT 294 305   Basic Metabolic Panel:  Recent Labs Lab 04/20/16 1340 04/21/16 2335  NA 139 137  K 4.1 4.1  CL 105 104  CO2 26 24  GLUCOSE 108* 277*  BUN 5* 16  CREATININE 0.62 0.89  CALCIUM 9.1 8.9   Liver Function Tests:  Recent Labs Lab 04/20/16 1340 04/21/16 2335  AST 18 18  ALT 13* 13*  ALKPHOS 76 72  BILITOT 0.4 0.4  PROT 6.9 7.3  ALBUMIN 3.7 3.9   Cardiac Enzymes:  Recent Labs Lab 04/21/16 2335  TROPONINI <0.03   Urine analysis:    Component Value Date/Time   COLORURINE YELLOW 04/04/2016 1601   APPEARANCEUR CLEAR 04/04/2016 1601  LABSPEC >1.030* 04/04/2016 1601   PHURINE 5.5 04/04/2016 1601   GLUCOSEU >1000* 04/04/2016 1601   HGBUR NEGATIVE 04/04/2016 1601    BILIRUBINUR NEGATIVE 04/04/2016 1601   KETONESUR NEGATIVE 04/04/2016 1601   PROTEINUR NEGATIVE 04/04/2016 1601   UROBILINOGEN 0.2 08/29/2015 1842   NITRITE NEGATIVE 04/04/2016 1601   LEUKOCYTESUR NEGATIVE 04/04/2016 1601   Sepsis Labs:  Recent Results (from the past 240 hour(s))  Blood Culture (routine x 2)     Status: None (Preliminary result)   Collection Time: 04/22/16 12:22 AM  Result Value Ref Range Status   Specimen Description BLOOD RIGHT HAND  Final   Special Requests   Final    BOTTLES DRAWN AEROBIC AND ANAEROBIC AEB 4CC ANA 2CC   Culture PENDING  Incomplete   Report Status PENDING  Incomplete  Blood Culture (routine x 2)     Status: None (Preliminary result)   Collection Time: 04/22/16 12:27 AM  Result Value Ref Range Status   Specimen Description BLOOD LEFT HAND  Final   Special Requests BOTTLES DRAWN AEROBIC ONLY 6CC  Final   Culture PENDING  Incomplete   Report Status PENDING  Incomplete     Radiological Exams on Admission: Dg Chest 2 View  04/22/2016  CLINICAL DATA:  Shortness of breath. Patient was here 2 weeks ago for congestion in was given antibiotics without relief. Cough. EXAM: CHEST  2 VIEW COMPARISON:  04/20/2016 FINDINGS: Postoperative changes in the mediastinum. Shallow inspiration. Atelectasis in the lung bases. Normal heart size and pulmonary vascularity. No focal airspace disease or consolidation. No blunting of costophrenic angles. No pneumothorax. Degenerative changes in the spine. IMPRESSION: Shallow inspiration with atelectasis in the lung bases. Electronically Signed   By: Burman Nieves M.D.   On: 04/22/2016 00:09   Dg Chest 2 View  04/20/2016  CLINICAL DATA:  Patient with worsening cough and shortness of breath. EXAM: CHEST  2 VIEW COMPARISON:  Chest radiograph 04/04/2016. FINDINGS: Stable cardiac and mediastinal contours status post median sternotomy and CABG procedure. No consolidative pulmonary opacity. No pleural effusion or pneumothorax.  Thoracic spine degenerative changes. Cholecystectomy clips. Surgical hardware involving the right humerus. IMPRESSION: No acute cardiopulmonary process. Electronically Signed   By: Annia Belt M.D.   On: 04/20/2016 13:57    EKG: Independently reviewed.   Assessment/Plan Principal Problem:   Chronic obstructive pulmonary disease (HCC) Active Problems:   Hypothyroidism   DM type 2 causing complication (HCC)   Obesity   Essential hypertension   Noncompliance with CPAP treatment   Hypoxia   COPD exacerbation (HCC)   OSA (obstructive sleep apnea)  1. COPD exacerbation. Febrile, hypoxic, CXR negative. Given IV steroids and nebs in ED. Will continue with IV steroids, nebs, O2, and IVFs.  Will change her IV antibiotic to Levaquin.  2. DM type 2, will hold patient's Mixtard.  I will give lower insulin with Lantus at 20 units BID and insulin resistent sliding scale. Continue metformin 3. Essential HTN. Continue outpatient meds. 4. Hypothyroid ism. Check tsh. Continue synthroid.  5. HLD. Continue statins.  6. Diastolic CHF. Stable.  7. OSA. She is non-compliant with CPAP. 8. Obesity. COunseled on the importance of diet and exercise.     DVT prophylaxis: lovenox Code Status: Full Family Communication: No family present at bedside. Disposition Plan: anticipate discharge home in 2-3 days. Consults called: none Admission status: admit to inpatient   Houston Siren, MD FACP [facility]  If 7PM-7AM, please contact night-coverage [email or web address] Password [email or web address] 04/22/2016, 1:36 AM  By signing my name below, I, Adron Bene, attest that this documentation has been prepared under the direction and in the presence of Houston Siren, MD. Electronically Signed: Adron Bene, Scribe 04/22/2016 1:30am

## 2016-04-22 NOTE — ED Notes (Signed)
Dr Knapp in to reassess 

## 2016-04-22 NOTE — ED Notes (Signed)
Vancomycin and Cefapime had been initially started and then stopped when lab reports cultures not drawn yet. Cultures drawn then vanc and cefipime restarted

## 2016-04-22 NOTE — ED Notes (Signed)
Report to Zsuzsanna, RN 

## 2016-04-22 NOTE — Progress Notes (Signed)
Night RN gave Pt 20 units of Lantus at 0256 am 5/1/017. 20 Units of Lantus due today, 04/22/16 at 1000. CBG 308. Pharmacy called and Mervyn GayLora stated that it was okay to give Lantus at this time. RN will continue to monitor pt. Lesly Dukesachel J Everett, RN

## 2016-04-22 NOTE — ED Notes (Signed)
CRITICAL VALUE ALERT  Critical value received:  Lactic acid 2.4  Date of notification:  04/22/2016  Time of notification:  01:07  Critical value read back: yes  Nurse who received alert:  Youlanda MightyMelinda Robben Jagiello RN   MD notified (1st page):  Dr Lynelle DoctorKnapp  Time of first page:  01:07  MD notified (2nd page):  Time of second page:  Responding MD:  Dr Lynelle DoctorKnapp  Time MD responded:  01:08

## 2016-04-22 NOTE — Progress Notes (Signed)
Lactic acid 2.6. Message sent to Dr. Nedra HaiLee in regards to lab value.

## 2016-04-23 LAB — GLUCOSE, CAPILLARY
GLUCOSE-CAPILLARY: 251 mg/dL — AB (ref 65–99)
GLUCOSE-CAPILLARY: 297 mg/dL — AB (ref 65–99)
GLUCOSE-CAPILLARY: 251 mg/dL — AB (ref 65–99)
Glucose-Capillary: 182 mg/dL — ABNORMAL HIGH (ref 65–99)
Glucose-Capillary: 227 mg/dL — ABNORMAL HIGH (ref 65–99)

## 2016-04-23 LAB — DIFFERENTIAL
BASOS ABS: 0 10*3/uL (ref 0.0–0.1)
Basophils Relative: 0 %
EOS PCT: 0 %
Eosinophils Absolute: 0 10*3/uL (ref 0.0–0.7)
LYMPHS ABS: 1.1 10*3/uL (ref 0.7–4.0)
LYMPHS PCT: 4 %
Monocytes Absolute: 0.8 10*3/uL (ref 0.1–1.0)
Monocytes Relative: 3 %
NEUTROS ABS: 27.7 10*3/uL — AB (ref 1.7–7.7)
NEUTROS PCT: 94 %

## 2016-04-23 LAB — CBC
HCT: 30.3 % — ABNORMAL LOW (ref 36.0–46.0)
HEMOGLOBIN: 8.6 g/dL — AB (ref 12.0–15.0)
MCH: 19.7 pg — AB (ref 26.0–34.0)
MCHC: 28.4 g/dL — ABNORMAL LOW (ref 30.0–36.0)
MCV: 69.3 fL — AB (ref 78.0–100.0)
PLATELETS: 305 10*3/uL (ref 150–400)
RBC: 4.37 MIL/uL (ref 3.87–5.11)
RDW: 19.1 % — ABNORMAL HIGH (ref 11.5–15.5)
WBC: 29.6 10*3/uL — AB (ref 4.0–10.5)

## 2016-04-23 LAB — BASIC METABOLIC PANEL
ANION GAP: 6 (ref 5–15)
BUN: 14 mg/dL (ref 6–20)
CHLORIDE: 110 mmol/L (ref 101–111)
CO2: 23 mmol/L (ref 22–32)
Calcium: 9.1 mg/dL (ref 8.9–10.3)
Creatinine, Ser: 0.65 mg/dL (ref 0.44–1.00)
GFR calc Af Amer: >60 mL/min (ref >60)
Glucose, Bld: 263 mg/dL — ABNORMAL HIGH (ref 65–99)
POTASSIUM: 5.3 mmol/L — AB (ref 3.5–5.1)
SODIUM: 139 mmol/L (ref 135–145)

## 2016-04-23 LAB — LACTIC ACID, PLASMA: LACTIC ACID, VENOUS: 1 mmol/L (ref 0.5–2.0)

## 2016-04-23 LAB — TROPONIN I: Troponin I: 0.05 ng/mL — ABNORMAL HIGH (ref <0.031)

## 2016-04-23 MED ORDER — AMLODIPINE BESYLATE 5 MG PO TABS
5.0000 mg | ORAL_TABLET | Freq: Every day | ORAL | Status: AC
  Administered 2016-04-23: 5 mg via ORAL
  Filled 2016-04-23: qty 1

## 2016-04-23 MED ORDER — NITROGLYCERIN 0.4 MG SL SUBL
SUBLINGUAL_TABLET | SUBLINGUAL | Status: AC
  Administered 2016-04-23: 0.4 mg
  Filled 2016-04-23: qty 1

## 2016-04-23 MED ORDER — TRAZODONE HCL 50 MG PO TABS
50.0000 mg | ORAL_TABLET | Freq: Every evening | ORAL | Status: DC | PRN
  Administered 2016-04-23 – 2016-04-25 (×2): 50 mg via ORAL
  Filled 2016-04-23 (×2): qty 1

## 2016-04-23 MED ORDER — NITROGLYCERIN 0.4 MG SL SUBL
SUBLINGUAL_TABLET | SUBLINGUAL | Status: AC
  Administered 2016-04-23: 15:00:00
  Filled 2016-04-23: qty 1

## 2016-04-23 NOTE — Care Management Note (Signed)
Case Management Note  Patient Details  Name: Lise AuerRuby J Spieth MRN: [id number] Date of Birth: Sep 28, 1936  Subjective/Objective:                  Pt is from home, lives with husband and is ind with ADL's. Pt has PCP, transportation and no difficulty obtaining medications. Pt has neb machine but no home O2 PTA. Pt plans to return home with self care at DC.   Action/Plan: No CM needs anticipated.   Expected Discharge Date:  04/25/16               Expected Discharge Plan:  Home/Self Care  In-House Referral:  NA  Discharge planning Services  CM Consult  Post Acute Care Choice:  NA Choice offered to:  NA  DME Arranged:    DME Agency:     HH Arranged:    HH Agency:     Status of Service:  Completed, signed off  Medicare Important Message Given:    Date Medicare IM Given:    Medicare IM give by:    Date Additional Medicare IM Given:    Additional Medicare Important Message give by:     If discussed at Long Length of Stay Meetings, dates discussed:    Additional Comments:  Malcolm MetroChildress, Maragret Vanacker Demske, RN 04/23/2016, 2:27 PM

## 2016-04-23 NOTE — Progress Notes (Signed)
Inpatient Diabetes Program Recommendations  AACE/ADA: New Consensus Statement on Inpatient Glycemic Control (2015)  Target Ranges:  Prepandial:   less than 140 mg/dL      Peak postprandial:   less than 180 mg/dL (1-2 hours)      Critically ill patients:  140 - 180 mg/dL   Review of Glycemic Control Results for [name] [name], [name] [name] (MRN [id number]) as of 04/23/2016 07:52  Ref. Range 04/22/2016 07:29 04/22/2016 11:04 04/22/2016 16:10 04/22/2016 21:06 04/23/2016 07:12  Glucose-Capillary Latest Ref Range: 65-99 mg/dL 132308 (H) 440290 (H) 102227 (H) 182 (H) 251 (H)   Diabetes history: Dm Type 2 Outpatient Diabetes medications: Tresiba 30 units am & 70 units pm + Metformin 500 mg bid Current orders for Inpatient glycemic control: Lantus 20 units bid + Novolog correction 0-20 units tid + 0-5 units hs + Novolog meal coverage 4 units tid + Metformin 500 mg bid  Inpatient Diabetes Program Recommendations:  Please consider increase in Lantus insulin to 25 units bid and increase meal coverage to Novolog 6 units tid with meals (hold if eats < 50%).  Thank you, Billy FischerJudy E. Tionna Gigante, RN, MSN, CDE Inpatient Glycemic Control Team Team Pager [phone] (8am-5pm) 04/23/2016 7:58 AM

## 2016-04-23 NOTE — Progress Notes (Signed)
Pt BP 133/60 and stable. Pt states CP has had relief since second nitro. Will continue to monitor. Lesly Dukesachel J Everett, RN

## 2016-04-23 NOTE — Progress Notes (Signed)
Steroids she has fair glycemic control on steroids [name] [name] [id number]:[id number] DOB: January 11, 1936 DOA: 2020-01-1916 PCP: Maricela Curet, MD             Physical Exam: Blood pressure 163/55, pulse 78, temperature 98.2 F (36.8 C), temperature source Oral, resp. rate 20, height '5\' 4"'$  (1.626 m), weight 205 lb 4 oz (93.1 kg), SpO2 97 %. lungs show mild to moderate inspiratory next 3 wheezes scattered rhonchi no rales appreciable heart regular rhythm no S3-S4 no heaves thrills rubs   Investigations:  Recent Results (from the past 240 hour(s))  Blood Culture (routine x 2)     Status: None (Preliminary result)   Collection Time: 04/22/16 12:22 AM  Result Value Ref Range Status   Specimen Description BLOOD RIGHT HAND  Final   Special Requests   Final    BOTTLES DRAWN AEROBIC AND ANAEROBIC AEB 4CC ANA 2CC   Culture NO GROWTH 1 DAY  Final   Report Status PENDING  Incomplete  Blood Culture (routine x 2)     Status: None (Preliminary result)   Collection Time: 04/22/16 12:27 AM  Result Value Ref Range Status   Specimen Description BLOOD LEFT HAND  Final   Special Requests BOTTLES DRAWN AEROBIC ONLY 6CC  Final   Culture NO GROWTH 1 DAY  Final   Report Status PENDING  Incomplete     Basic Metabolic Panel:  Recent Labs  04/22/16 0446 04/23/16 0529  NA 139 139  K 3.7 5.3*  CL 108 110  CO2 19* 23  GLUCOSE 300* 263*  BUN 12 14  CREATININE 0.79 0.65  CALCIUM 8.8* 9.1   Liver Function Tests:  Recent Labs  04/20/16 1340 04/21/16 2335  AST 18 18  ALT 13* 13*  ALKPHOS 76 72  BILITOT 0.4 0.4  PROT 6.9 7.3  ALBUMIN 3.7 3.9     CBC:  Recent Labs  04/21/16 2335 04/22/16 0446 04/23/16 0529  WBC 14.8* 26.8* 29.6*  NEUTROABS 11.9*  --  27.7*  HGB 9.6* 9.5* 8.6*  HCT 33.4* 33.0* 30.3*  MCV 68.4* 69.5* 69.3*  PLT 305 287 305    Dg Chest 2 View  04/22/2016  CLINICAL DATA:  Shortness of breath. Patient was here 2 weeks ago for congestion in was given antibiotics  without relief. Cough. EXAM: CHEST  2 VIEW COMPARISON:  04/20/2016 FINDINGS: Postoperative changes in the mediastinum. Shallow inspiration. Atelectasis in the lung bases. Normal heart size and pulmonary vascularity. No focal airspace disease or consolidation. No blunting of costophrenic angles. No pneumothorax. Degenerative changes in the spine. IMPRESSION: Shallow inspiration with atelectasis in the lung bases. Electronically Signed   By: Lucienne Capers M.D.   On: 04/22/2016 00:09      Medications:   Impression:  Principal Problem:   Chronic obstructive pulmonary disease (HCC) Active Problems:   Hypothyroidism   DM type 2 causing complication (HCC)   Obesity   Essential hypertension   Noncompliance with CPAP treatment   Hypoxia   COPD exacerbation (HCC)   OSA (obstructive sleep apnea)     Plan: Check be met and potassium in a.m. continue IV antibiotics IV Solu-Medrol as well as insulin coverage DuoNeb nebulizers  Consultants:    Procedures   Antibiotics: IV Levaquin                  Code Status:   Family Communication:    Disposition Plan   Time spent: 30 minutes   LOS: 1 day   Vincenza Dail  M   04/23/2016, 1:15 PM

## 2016-04-23 NOTE — Progress Notes (Signed)
Pt complained of CP located mid chest radiating to the Left side. Rated 8 scale. EKG ordered. BP 161/61. HR 90. Nitro given. MD notified and aware. Lesly Dukesachel J Everett, RN

## 2016-04-23 NOTE — Progress Notes (Signed)
Post first Nitro, Pain subsided very little. Nitro given for second dose. BP now 155/60. EKG showed SR with 1st degree AV HB. MD notified/aware. MD gave verbal orders for a STAT Troponin x1 and Norvasc 5 mg PO for one dose. RN will continue to monitor. Lesly Dukesachel J Everett, RN

## 2016-04-24 LAB — URINE CULTURE

## 2016-04-24 LAB — CBC
HEMATOCRIT: 31.5 % — AB (ref 36.0–46.0)
HEMOGLOBIN: 9.1 g/dL — AB (ref 12.0–15.0)
MCH: 19.8 pg — ABNORMAL LOW (ref 26.0–34.0)
MCHC: 28.9 g/dL — ABNORMAL LOW (ref 30.0–36.0)
MCV: 68.5 fL — AB (ref 78.0–100.0)
Platelets: 319 10*3/uL (ref 150–400)
RBC: 4.6 MIL/uL (ref 3.87–5.11)
RDW: 19.2 % — AB (ref 11.5–15.5)
WBC: 23.9 10*3/uL — AB (ref 4.0–10.5)

## 2016-04-24 LAB — BASIC METABOLIC PANEL
ANION GAP: 6 (ref 5–15)
BUN: 20 mg/dL (ref 6–20)
CO2: 24 mmol/L (ref 22–32)
Calcium: 9.1 mg/dL (ref 8.9–10.3)
Chloride: 108 mmol/L (ref 101–111)
Creatinine, Ser: 0.7 mg/dL (ref 0.44–1.00)
GFR calc Af Amer: >60 mL/min (ref >60)
GLUCOSE: 275 mg/dL — AB (ref 65–99)
POTASSIUM: 4.5 mmol/L (ref 3.5–5.1)
Sodium: 138 mmol/L (ref 135–145)

## 2016-04-24 LAB — DIFFERENTIAL
BASOS ABS: 0 10*3/uL (ref 0.0–0.1)
Basophils Relative: 0 %
Eosinophils Absolute: 0 10*3/uL (ref 0.0–0.7)
Eosinophils Relative: 0 %
LYMPHS PCT: 4 %
Lymphs Abs: 1 10*3/uL (ref 0.7–4.0)
MONO ABS: 0.6 10*3/uL (ref 0.1–1.0)
Monocytes Relative: 3 %
NEUTROS ABS: 22.2 10*3/uL — AB (ref 1.7–7.7)
Neutrophils Relative %: 93 %

## 2016-04-24 LAB — GLUCOSE, CAPILLARY
GLUCOSE-CAPILLARY: 360 mg/dL — AB (ref 65–99)
GLUCOSE-CAPILLARY: 190 mg/dL — AB (ref 65–99)
Glucose-Capillary: 303 mg/dL — ABNORMAL HIGH (ref 65–99)

## 2016-04-24 MED ORDER — DOCUSATE SODIUM 100 MG PO CAPS
100.0000 mg | ORAL_CAPSULE | Freq: Every day | ORAL | Status: DC
  Administered 2016-04-24 – 2016-04-25 (×2): 100 mg via ORAL
  Filled 2016-04-24 (×2): qty 1

## 2016-04-24 MED ORDER — LORAZEPAM 1 MG PO TABS
2.0000 mg | ORAL_TABLET | Freq: Every day | ORAL | Status: DC
  Administered 2016-04-24: 2 mg via ORAL
  Filled 2016-04-24: qty 2

## 2016-04-24 MED ORDER — ALBUTEROL SULFATE (2.5 MG/3ML) 0.083% IN NEBU
2.5000 mg | INHALATION_SOLUTION | Freq: Three times a day (TID) | RESPIRATORY_TRACT | Status: DC
  Administered 2016-04-24 – 2016-04-25 (×5): 2.5 mg via RESPIRATORY_TRACT
  Filled 2016-04-24 (×5): qty 3

## 2016-04-24 NOTE — Progress Notes (Signed)
Patient continues with moderate inspiratory and expiratory wheeze we'll continue IV Solu-Medrol as well as nebulizer therapy will increase Ativan to 20 mg by mouth at bedtime for sleep at her request [name] AuerRuby J [name] ZOX:[id number] DOB: [birth year] DOA: 04/21/2016 PCP: [name],[name] M, MD   Physical Exam: Blood pressure 133/74, pulse 78, temperature 98 F (36.7 C), temperature source Oral, resp. rate 18, height 5\' 4"  (1.626 m), weight 205 lb 4 oz (93.1 kg), SpO2 92 %. lungs show moderate his return after wheeze scattered rhonchi no rales no appreciable heart regular rhythm no S3-S4 no heaves thrills rubs   Investigations:  Recent Results (from the past 240 hour(s))  Blood Culture (routine x 2)     Status: None (Preliminary result)   Collection Time: 04/22/16 12:22 AM  Result Value Ref Range Status   Specimen Description BLOOD RIGHT HAND  Final   Special Requests   Final    BOTTLES DRAWN AEROBIC AND ANAEROBIC AEB=4CC ANA=2CC   Culture NO GROWTH 2 DAYS  Final   Report Status PENDING  Incomplete  Blood Culture (routine x 2)     Status: None (Preliminary result)   Collection Time: 04/22/16 12:27 AM  Result Value Ref Range Status   Specimen Description BLOOD LEFT HAND  Final   Special Requests BOTTLES DRAWN AEROBIC ONLY 6CC  Final   Culture NO GROWTH 2 DAYS  Final   Report Status PENDING  Incomplete  Urine culture     Status: Abnormal   Collection Time: 04/22/16  3:19 AM  Result Value Ref Range Status   Specimen Description URINE, CLEAN CATCH  Final   Special Requests NONE  Final   Culture >=100,000 COLONIES/mL ENTEROCOCCUS SPECIES (A)  Final   Report Status 04/24/2016 FINAL  Final   Organism ID, Bacteria ENTEROCOCCUS SPECIES (A)  Final      Susceptibility   Enterococcus species - MIC*    AMPICILLIN <=2 SENSITIVE Sensitive     LEVOFLOXACIN 1 SENSITIVE Sensitive     NITROFURANTOIN <=16 SENSITIVE Sensitive     VANCOMYCIN 1 SENSITIVE Sensitive     * >=100,000 COLONIES/mL ENTEROCOCCUS  SPECIES     Basic Metabolic Panel:  Recent Labs  81/19/1404/01/09 0446 04/23/16 0529  NA 139 139  K 3.7 5.3*  CL 108 110  CO2 19* 23  GLUCOSE 300* 263*  BUN 12 14  CREATININE 0.79 0.65  CALCIUM 8.8* 9.1   Liver Function Tests:  Recent Labs  04/21/16 2335  AST 18  ALT 13*  ALKPHOS 72  BILITOT 0.4  PROT 7.3  ALBUMIN 3.9     CBC:  Recent Labs  04/23/16 0529 04/24/16 0537  WBC 29.6* 23.9*  NEUTROABS 27.7* 22.2*  HGB 8.6* 9.1*  HCT 30.3* 31.5*  MCV 69.3* 68.5*  PLT 305 319    No results found.    Medications:   Impression:  Principal Problem:   Chronic obstructive pulmonary disease (HCC) Active Problems:   Hypothyroidism   DM type 2 causing complication (HCC)   Obesity   Essential hypertension   Noncompliance with CPAP treatment   Hypoxia   COPD exacerbation (HCC)   OSA (obstructive sleep apnea)     Plan: Continue IV steroids and nebulizer therapy  Consultants:    Procedures   Antibiotics:           Time spent: 30 minutes   LOS: 2 days   Trevon Strothers M   04/24/2016, 1:27 PM

## 2016-04-24 NOTE — Progress Notes (Signed)
Inpatient Diabetes Program Recommendations  AACE/ADA: New Consensus Statement on Inpatient Glycemic Control (2015)  Target Ranges:  Prepandial:   less than 140 mg/dL      Peak postprandial:   less than 180 mg/dL (1-2 hours)      Critically ill patients:  140 - 180 mg/dL  Results for [name] [name], [name] [name] (MRN [id number]) as of 04/24/2016 09:39  Ref. Range 04/23/2016 07:12 04/23/2016 11:12 04/23/2016 16:10 04/23/2016 20:53 04/24/2016 07:04  Glucose-Capillary Latest Ref Range: 65-99 mg/dL 409251 (H) 811227 (H) 914297 (H) 251 (H) 303 (H)   Review of Glycemic Control  Diabetes history: DM2 Outpatient Diabetes medications: Tresiba 30 units QAM, Tresiba 70 units QPM, Metformin 500 mg BID Current orders for Inpatient glycemic control: Lantus 20 units BID, Novolog 0-20 units TID with meals, Novolog 0-5 units QHS, Novolog 4 units TID with meals  Inpatient Diabetes Program Recommendations: Insulin - Basal: Glucose has ranged from 229-303 mg/dl over the past 24 hours. If steroids are continued as ordered, please consider increasing Lantus to 25 units BID. Insulin - Meal Coverage: Please consider increasing meal coverage to Novolog 7 units TID with meals.  Thanks, Orlando PennerMarie Chancy Claros, RN, MSN, CDE Diabetes Coordinator Inpatient Diabetes Program [phone] (Team Pager from 8am to 5pm) [phone] ([facility] office) [phone] [facility] office) [phone] [facility] office)

## 2016-04-25 LAB — GLUCOSE, CAPILLARY
GLUCOSE-CAPILLARY: 235 mg/dL — AB (ref 65–99)
Glucose-Capillary: 263 mg/dL — ABNORMAL HIGH (ref 65–99)
Glucose-Capillary: 279 mg/dL — ABNORMAL HIGH (ref 65–99)
Glucose-Capillary: 232 mg/dL — ABNORMAL HIGH (ref 65–99)

## 2016-04-25 LAB — BASIC METABOLIC PANEL
Anion gap: 7 (ref 5–15)
BUN: 17 mg/dL (ref 6–20)
CO2: 28 mmol/L (ref 22–32)
CREATININE: 0.54 mg/dL (ref 0.44–1.00)
Calcium: 8.7 mg/dL — ABNORMAL LOW (ref 8.9–10.3)
Chloride: 106 mmol/L (ref 101–111)
GFR calc Af Amer: >60 mL/min (ref >60)
GLUCOSE: 235 mg/dL — AB (ref 65–99)
Potassium: 4.1 mmol/L (ref 3.5–5.1)
SODIUM: 141 mmol/L (ref 135–145)

## 2016-04-25 LAB — CBC
HCT: 30.9 % — ABNORMAL LOW (ref 36.0–46.0)
Hemoglobin: 8.9 g/dL — ABNORMAL LOW (ref 12.0–15.0)
MCH: 19.6 pg — AB (ref 26.0–34.0)
MCHC: 28.8 g/dL — ABNORMAL LOW (ref 30.0–36.0)
MCV: 67.9 fL — AB (ref 78.0–100.0)
PLATELETS: 321 10*3/uL (ref 150–400)
RBC: 4.55 MIL/uL (ref 3.87–5.11)
RDW: 19.2 % — AB (ref 11.5–15.5)
WBC: 14.4 10*3/uL — AB (ref 4.0–10.5)

## 2016-04-25 LAB — DIFFERENTIAL
Basophils Absolute: 0 10*3/uL (ref 0.0–0.1)
Basophils Relative: 0 %
EOS ABS: 0 10*3/uL (ref 0.0–0.7)
EOS PCT: 0 %
Lymphocytes Relative: 9 %
Lymphs Abs: 1.3 10*3/uL (ref 0.7–4.0)
MONO ABS: 0.5 10*3/uL (ref 0.1–1.0)
Monocytes Relative: 4 %
NEUTROS PCT: 87 %
Neutro Abs: 12.6 10*3/uL — ABNORMAL HIGH (ref 1.7–7.7)

## 2016-04-25 MED ORDER — LEVOFLOXACIN 750 MG PO TABS: 750.0000 mg | ORAL_TABLET | Freq: Every day | ORAL | Status: DC

## 2016-04-25 MED ORDER — PREDNISONE 20 MG PO TABS: 20.0000 mg | ORAL_TABLET | Freq: Every day | ORAL | Status: DC

## 2016-04-25 NOTE — Care Management Important Message (Signed)
Important Message  Patient Details  Name: [name] [name] MRN: [id number] Date of Birth: 14-Jan-1936   Medicare Important Message Given:  Yes    Malcolm MetroChildress, Donnie Gedeon Demske, RN 04/25/2016, 11:05 AM

## 2016-04-25 NOTE — Care Management Note (Signed)
Case Management Note  Patient Details  Name: Lise AuerRuby J Kirkland MRN: [id number] Date of Birth: 11-Jul-1936  Expected Discharge Date:  04/25/16               Expected Discharge Plan:  Home/Self Care  In-House Referral:  NA  Discharge planning Services  CM Consult  Post Acute Care Choice:  NA Choice offered to:  NA  DME Arranged:    DME Agency:     HH Arranged:    HH Agency:     Status of Service:  Completed, signed off  Medicare Important Message Given:  Yes Date Medicare IM Given:    Medicare IM give by:    Date Additional Medicare IM Given:    Additional Medicare Important Message give by:     If discussed at Long Length of Stay Meetings, dates discussed:    Additional Comments: Pt discharging home today. No CM needs.   Malcolm Metrohildress, Reis Goga Demske, RN 04/25/2016, 2:12 PM

## 2016-04-25 NOTE — Discharge Summary (Signed)
Physician Discharge Summary  [name] [name] DOB: Oct 20, 1936 DOA: 2020-11-2916  PCP: [name] [name],[name] [name]  Admit date: 2020-11-2916 Discharge date: 04/25/2016   Recommendations for Outpatient Follow-up:  The patient is advised to take her albuterol nebulizer therapy 4 times a day at home. As well as taking her prescribe other prehospital admission insulin orders and to take prednisone 20 mg tablets 2 tablets a day for 7 days then decrease to one tablet a day 4 days 8/14 then discontinue she should follow-up my office in one week's time to assess bronchospastic response Discharge Diagnoses:  Principal Problem:   Chronic obstructive pulmonary disease (HCC) Active Problems:   Hypothyroidism   DM type 2 causing complication (HCC)   Obesity   Essential hypertension   Noncompliance with CPAP treatment   Hypoxia   COPD exacerbation (HCC)   OSA (obstructive sleep apnea)   Discharge Condition: Good and improving  Filed Weights   04/21/16 2355 04/22/16 0250  Weight: 199 lb (90.266 kg) 205 lb 4 oz (93.1 kg)    History of present illness:  Patient is an elderly white female with hypertension hyperlipidemia history of CVA 2 diabetes with recurrent asthmatic bronchitis admitted with asthmatic bronchitis exacerbation placed on high-dose steroids per Antibiotics aggressive nebulizer therapy and proceeded to improve gradually over 60. On hospital spasm diminished over this period of time and suddenly felt safe to start oral steroids which were done the day of discharge she was instructed to take 40 mg prednisone for 7 days and then decrease to 20 mg per day for an additional 7 days despite a clear chest x-ray  Hospital Course:  See history of present illness above  Procedures:    Consultations:    Discharge Instructions  Discharge Instructions    Discharge instructions    Complete by:  As directed      Discharge patient    Complete by:  As directed               Medication List    STOP taking these medications        azithromycin 250 MG tablet  Commonly known as:  ZITHROMAX     ondansetron 4 MG disintegrating tablet  Commonly known as:  ZOFRAN ODT      TAKE these medications        albuterol (2.5 MG/3ML) 0.083% nebulizer solution  Commonly known as:  PROVENTIL  Take 3 mLs (2.5 mg total) by nebulization every 4 (four) hours as needed for wheezing or shortness of breath.     aspirin EC 325 MG tablet  Take 325 mg by mouth daily.     levothyroxine 25 MCG tablet  Commonly known as:  SYNTHROID, LEVOTHROID  Take 25 mcg by mouth every morning.     lisinopril 10 MG tablet  Commonly known as:  PRINIVIL,ZESTRIL  Take 1 tablet by mouth every morning.     LORazepam 1 MG tablet  Commonly known as:  ATIVAN  Take 1 mg by mouth at bedtime.     metFORMIN 500 MG tablet  Commonly known as:  GLUCOPHAGE  Take 500 mg by mouth 2 (two) times daily with a meal.     nitroGLYCERIN 0.4 MG SL tablet  Commonly known as:  NITROSTAT  Place 0.4 mg under the tongue every 5 (five) minutes as needed for chest pain.     NOVOLIN 70/30 RELION (70-30) 100 UNIT/ML injection  Generic drug:  insulin NPH-regular Human  Inject 20 Units into the  skin 3 (three) times daily before meals.     oxyCODONE-acetaminophen 7.5-325 MG tablet  Commonly known as:  PERCOCET  Take 1 tablet by mouth 4 (four) times daily as needed for moderate pain.     pravastatin 40 MG tablet  Commonly known as:  PRAVACHOL  Take 40 mg by mouth daily.     predniSONE 20 MG tablet  Commonly known as:  DELTASONE  Take 1 tablet (20 mg total) by mouth daily with breakfast.  Start taking on:  04/26/2016     TRESIBA FLEXTOUCH 200 UNIT/ML Sopn  Generic drug:  Insulin Degludec  Inject 35-70 Units into the skin 2 (two) times daily. 35 units in the morning and 70 units in the evening       Allergies  Allergen Reactions  . Codeine Itching      The results of significant diagnostics from this  hospitalization (including imaging, microbiology, ancillary and laboratory) are listed below for reference.    Significant Diagnostic Studies: Dg Chest 2 View  04/22/2016  CLINICAL DATA:  Shortness of breath. Patient was here 2 weeks ago for congestion in was given antibiotics without relief. Cough. EXAM: CHEST  2 VIEW COMPARISON:  04/20/2016 FINDINGS: Postoperative changes in the mediastinum. Shallow inspiration. Atelectasis in the lung bases. Normal heart size and pulmonary vascularity. No focal airspace disease or consolidation. No blunting of costophrenic angles. No pneumothorax. Degenerative changes in the spine. IMPRESSION: Shallow inspiration with atelectasis in the lung bases. Electronically Signed   By: Burman Nieves M.D.   On: 04/22/2016 00:09   Dg Chest 2 View  04/20/2016  CLINICAL DATA:  Patient with worsening cough and shortness of breath. EXAM: CHEST  2 VIEW COMPARISON:  Chest radiograph 04/04/2016. FINDINGS: Stable cardiac and mediastinal contours status post median sternotomy and CABG procedure. No consolidative pulmonary opacity. No pleural effusion or pneumothorax. Thoracic spine degenerative changes. Cholecystectomy clips. Surgical hardware involving the right humerus. IMPRESSION: No acute cardiopulmonary process. Electronically Signed   By: Annia Belt M.D.   On: 04/20/2016 13:57   Ct Head Wo Contrast  04/04/2016  CLINICAL DATA:  Weakness, diaphoresis for 2 days worse today, syncopal episode today, diabetes mellitus, hypertension, CHF, coronary artery disease post MI and CABG. Prior stroke EXAM: CT HEAD WITHOUT CONTRAST TECHNIQUE: Contiguous axial images were obtained from the base of the skull through the vertex without intravenous contrast. COMPARISON:  03/18/2016 FINDINGS: Scattered streak artifacts. Generalized atrophy. Normal ventricular morphology. No midline shift or mass effect. Small vessel chronic ischemic changes of deep cerebral white matter. No intracranial hemorrhage,  mass lesion, or acute infarction. Visualized paranasal sinuses and mastoid air cells clear. Bones unremarkable. IMPRESSION: Atrophy with small vessel chronic ischemic changes of deep cerebral white matter. No acute intracranial abnormalities. Electronically Signed   By: Ulyses Southward M.D.   On: 04/04/2016 17:24   [name] Contrast  04/04/2016  CLINICAL DATA:  80 year old female with multiple syncopal episodes in the last several weeks. Increasing weakness, dizziness. Initial encounter. EXAM: MRI HEAD WITHOUT CONTRAST TECHNIQUE: Multiplanar, multiecho pulse sequences of the brain and surrounding structures were obtained without intravenous contrast. COMPARISON:  Head CT without contrast 1703 hours today. Brain MRI 03/18/2016, and earlier. FINDINGS: Study is intermittently degraded by motion artifact despite repeated imaging attempts. Major intracranial vascular flow voids appear stable. No restricted diffusion to suggest acute infarction. No midline shift, mass effect, evidence of mass lesion, ventriculomegaly, extra-axial collection or acute intracranial hemorrhage. Cervicomedullary junction and pituitary are within normal limits. Negative  visualized cervical spine. Stable gray-white matter differentiation throughout the brain. Nonspecific T2 and FLAIR hyperintensity in the cerebral white matter and pons appears stable. Suggestion of small chronic lacunar infarcts in both thalami again noted. No new signal abnormality identified. Grossly stable and normal visualized internal auditory structures. Trace mastoid effusions appear stable, and the nasopharynx appears normal. Mild ethmoid sinus mucosal thickening is stable. Postoperative changes to both globes again noted common negative orbit and scalp soft tissues. Normal bone marrow signal. IMPRESSION: No acute intracranial abnormality. Stable noncontrast MRI appearance of the brain since March, with mild motion degradation today. Electronically Signed   By: Odessa Fleming  M.D.   On: 04/04/2016 19:58   Dg Chest Portable 1 View  04/04/2016  CLINICAL DATA:  Chest pain EXAM: PORTABLE CHEST 1 VIEW COMPARISON:  03/18/2016 FINDINGS: Cardiac enlargement. Mild vascular congestion without edema or effusion. Mild bibasilar atelectasis. Postop CABG. IMPRESSION: Mild vascular congestion without edema.  Mild bibasilar atelectasis. Electronically Signed   By: Marlan Palau M.D.   On: 04/04/2016 15:35   Dg Hips Bilat With Pelvis 3-4 Views  04/04/2016  CLINICAL DATA:  Persistent pain after several recent falls EXAM: DG HIP (WITH OR WITHOUT PELVIS) 3-4V BILAT COMPARISON:  None. FINDINGS: Frontal pelvis as well as frontal and lateral views of each hip-total five views- obtained. No acute fracture or dislocation evident. There is mild symmetric narrowing of both hip joints. No erosive change. There is extensive arterial vascular calcification bilaterally. IMPRESSION: Symmetric osteoarthritic change in both hip joints. Multiple foci of arterial vascular calcification. No fracture or dislocation. Electronically Signed   By: Bretta Bang III M.D.   On: 04/04/2016 17:00    Microbiology: Recent Results (from the past 240 hour(s))  Blood Culture (routine x 2)     Status: None (Preliminary result)   Collection Time: 04/22/16 12:22 AM  Result Value Ref Range Status   Specimen Description BLOOD RIGHT HAND  Final   Special Requests   Final    BOTTLES DRAWN AEROBIC AND ANAEROBIC AEB=4CC ANA=2CC   Culture NO GROWTH 3 DAYS  Final   Report Status PENDING  Incomplete  Blood Culture (routine x 2)     Status: None (Preliminary result)   Collection Time: 04/22/16 12:27 AM  Result Value Ref Range Status   Specimen Description BLOOD LEFT HAND  Final   Special Requests BOTTLES DRAWN AEROBIC ONLY 6CC  Final   Culture NO GROWTH 3 DAYS  Final   Report Status PENDING  Incomplete  Urine culture     Status: Abnormal   Collection Time: 04/22/16  3:19 AM  Result Value Ref Range Status   Specimen  Description URINE, CLEAN CATCH  Final   Special Requests NONE  Final   Culture >=100,000 COLONIES/mL ENTEROCOCCUS SPECIES (A)  Final   Report Status 04/24/2016 FINAL  Final   Organism ID, Bacteria ENTEROCOCCUS SPECIES (A)  Final      Susceptibility   Enterococcus species - MIC*    AMPICILLIN <=2 SENSITIVE Sensitive     LEVOFLOXACIN 1 SENSITIVE Sensitive     NITROFURANTOIN <=16 SENSITIVE Sensitive     VANCOMYCIN 1 SENSITIVE Sensitive     * >=100,000 COLONIES/mL ENTEROCOCCUS SPECIES     Labs: Basic Metabolic Panel:  Recent Labs Lab 04/21/16 2335 04/22/16 0446 04/23/16 0529 04/24/16 0537 04/25/16 0540  NA 137 139 139 138 141  K 4.1 3.7 5.3* 4.5 4.1  CL 104 108 110 108 106  CO2 24 19* 23 24 28  GLUCOSE 277* 300* 263* 275* 235*  BUN 16 12 14 20 17   CREATININE 0.89 0.79 0.65 0.70 0.54  CALCIUM 8.9 8.8* 9.1 9.1 8.7*   Liver Function Tests:  Recent Labs Lab 04/20/16 1340 04/21/16 2335  AST 18 18  ALT 13* 13*  ALKPHOS 76 72  BILITOT 0.4 0.4  PROT 6.9 7.3  ALBUMIN 3.7 3.9   No results for input(s): LIPASE, AMYLASE in the last 168 hours. No results for input(s): AMMONIA in the last 168 hours. CBC:  Recent Labs Lab 04/20/16 1340 04/21/16 2335 04/22/16 0446 04/23/16 0529 04/24/16 0537 04/25/16 0540  WBC 8.5 14.8* 26.8* 29.6* 23.9* 14.4*  NEUTROABS 5.8 11.9*  --  27.7* 22.2* 12.6*  HGB 9.7* 9.6* 9.5* 8.6* 9.1* 8.9*  HCT 34.2* 33.4* 33.0* 30.3* 31.5* 30.9*  MCV 68.4* 68.4* 69.5* 69.3* 68.5* 67.9*  PLT 294 305 287 305 319 321   Cardiac Enzymes:  Recent Labs Lab 04/21/16 2335 04/23/16 1539  TROPONINI <0.03 0.05*   BNP: BNP (last 3 results)  Recent Labs  04/20/16 1340 04/22/16 0022  BNP 140.0* 47.0    ProBNP (last 3 results) No results for input(s): PROBNP in the last 8760 hours.  CBG:  Recent Labs Lab 04/24/16 1630 04/24/16 2210 04/25/16 0753 04/25/16 1104 04/25/16 1246  GLUCAP 190* 235* 263* 279* 232*        Signed:  Posey Petrik M  [facility] Pager: [phone] 04/25/2016, 1:46 PM

## 2016-04-25 NOTE — Progress Notes (Signed)
PHARMACIST - PHYSICIAN COMMUNICATION DR:   Janna ArchonDiego CONCERNING: Antibiotic IV to Oral Route Change Policy  RECOMMENDATION: This patient is receiving LEVAQUIN by the intravenous route.  Based on criteria approved by the Pharmacy and Therapeutics Committee, the antibiotic(s) is/are being converted to the equivalent oral dose form(s).   DESCRIPTION: These criteria include:  Patient being treated for a respiratory tract infection, urinary tract infection, cellulitis or clostridium difficile associated diarrhea if on metronidazole  The patient is not neutropenic and does not exhibit a GI malabsorption state  The patient is eating (either orally or via tube) and/or has been taking other orally administered medications for a least 24 hours  The patient is improving clinically and has a Tmax < 100.5  If you have questions about this conversion, please contact the Pharmacy Department  [x]   [phone]( [phone] )  [facility] []   [phone]( [phone] )  [facility] []   [phone]( [phone] )  [facility] []   [phone]( [phone]  [facility] []   [phone] )  [facility]    Valrie HartScott Dhara Schepp, PharmD Clinical Pharmacist Pager:  [phone] 04/25/2016 9:54 AM

## 2016-04-25 NOTE — Progress Notes (Signed)
AVS reviewed with patient.  Verbalized understanding of discharge instructions, physician follow-up, medications.  Prescription for Ativan given to patient.  Patient to go by Dr. Otilio SaberonDiego's office for insulin tomorrow.  Patient's IV removed.  Site WNL.  Patient transported by NT via w/c to main entrance for discharge.  Patient stable at time of discharge.

## 2016-04-25 NOTE — Progress Notes (Signed)
Inpatient Diabetes Program Recommendations  AACE/ADA: New Consensus Statement on Inpatient Glycemic Control (2015)  Target Ranges:  Prepandial:   less than 140 mg/dL      Peak postprandial:   less than 180 mg/dL (1-2 hours)      Critically ill patients:  140 - 180 mg/dL   Results for [name] [name], [name] [name] (MRN [id number]) as of 04/25/2016 10:22  Ref. Range 04/24/2016 07:04 04/24/2016 11:02 04/24/2016 16:30 04/24/2016 22:10 04/25/2016 07:53  Glucose-Capillary Latest Ref Range: 65-99 mg/dL 578303 (H) 469360 (H) 629190 (H) 235 (H) 263 (H)   Review of Glycemic Control  Diabetes history: DM2 Outpatient Diabetes medications: Tresiba 30 units QAM, Tresiba 70 units QPM, Metformin 500 mg BID Current orders for Inpatient glycemic control: Lantus 20 units BID, Novolog 0-20 units TID with meals, Novolog 0-5 units QHS, Novolog 4 units TID with meals  Inpatient Diabetes Program Recommendations: Insulin - Basal: Glucose has ranged from 190-360 mg/dl over the past 24 hours. If steroids are continued as ordered, please consider increasing Lantus to 25 units BID. Insulin - Meal Coverage: Please consider increasing meal coverage to Novolog 8 units TID with meals.  Thanks, Orlando PennerMarie Nolyn Eilert, RN, MSN, CDE Diabetes Coordinator Inpatient Diabetes Program [phone] (Team Pager from 8am to 5pm) [phone] ([facility] office) [phone] [facility] office) [phone] [facility] office)

## 2016-04-27 LAB — CULTURE, BLOOD (ROUTINE X 2)
Culture: NO GROWTH
Culture: NO GROWTH

## 2016-05-16 IMAGING — CT CT ABD-PELV W/O CM
2 of 3 series · 18 of 46 positions shown, 20 images · non-contrast
Comparison: 08/31/2010 and abdomen ultrasound dated 12/10/2013.

CLINICAL DATA: Left abdomen and hip pain.

EXAM:
CT ABDOMEN AND PELVIS WITHOUT CONTRAST
TECHNIQUE: Multidetector CT imaging of the abdomen and pelvis was performed
following the standard protocol without IV contrast.

[Series 2: standard/full over (age)lbs 5.0 · axial · 0.92mm/px · z∈[-418,-4]mm · 15 of 92 slices shown, 17 images]
[im 6/92  soft-tissue]
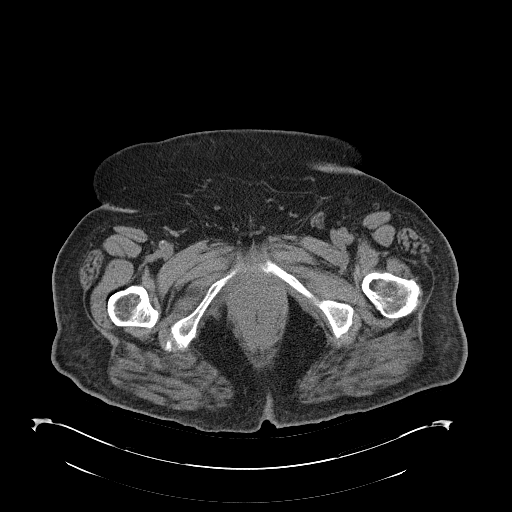
[im 6/92  bone]
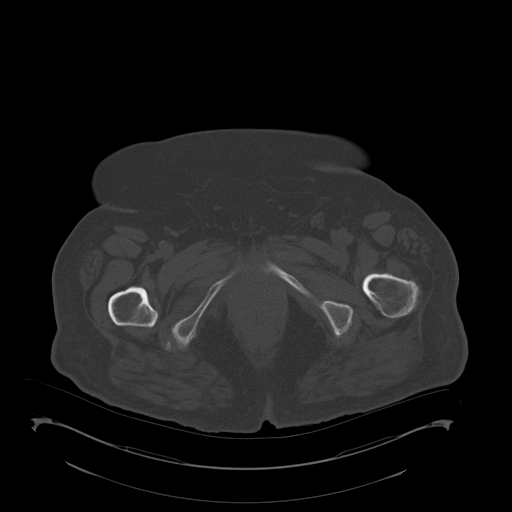
[im 12/92  soft-tissue]
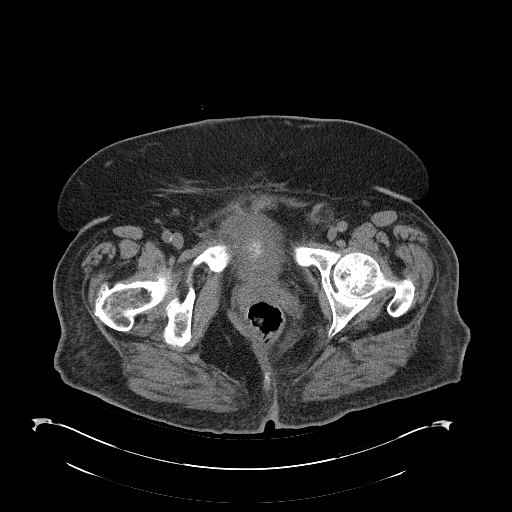
[im 18/92  soft-tissue]
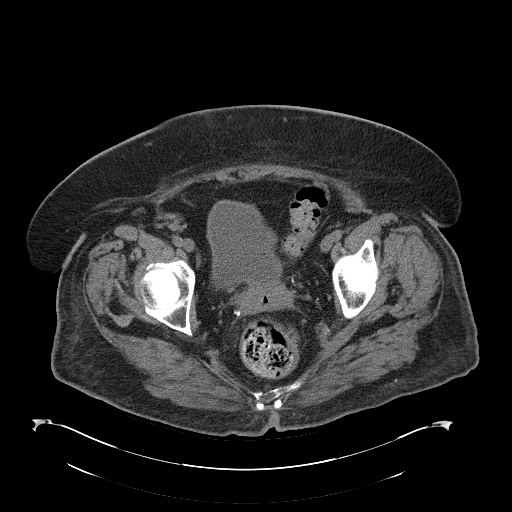
[im 24/92  soft-tissue]
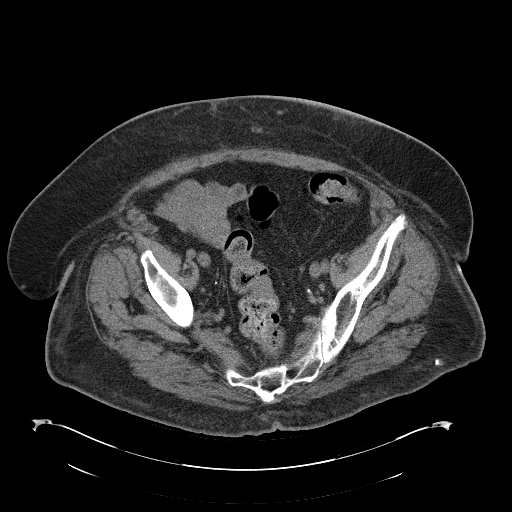
[im 30/92  soft-tissue]
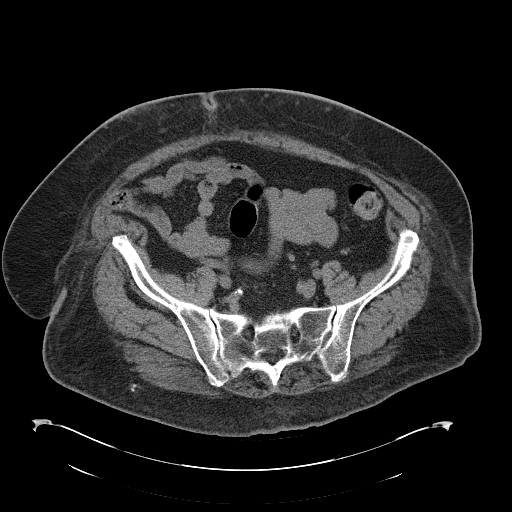
[im 36/92  soft-tissue]
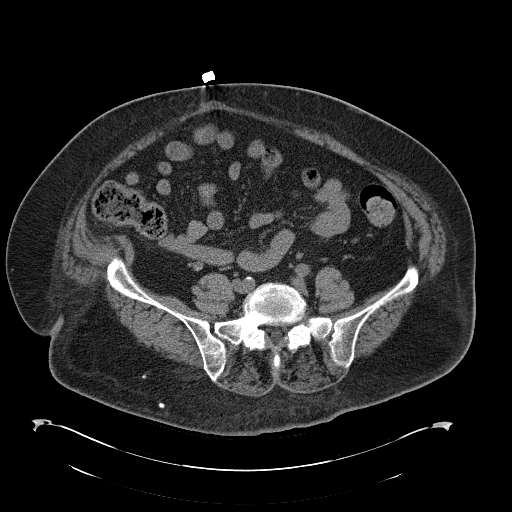
[im 42/92  soft-tissue]
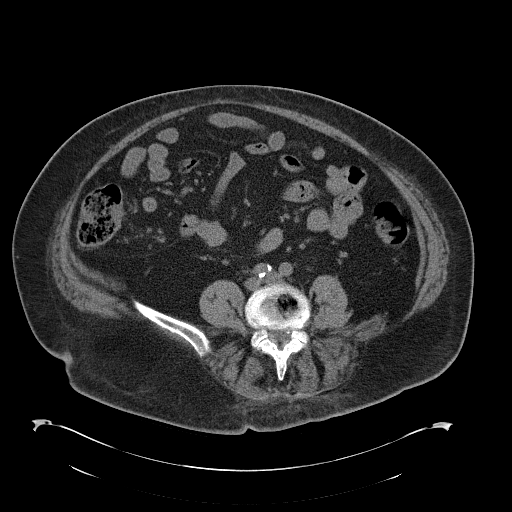
[im 47/92  soft-tissue]
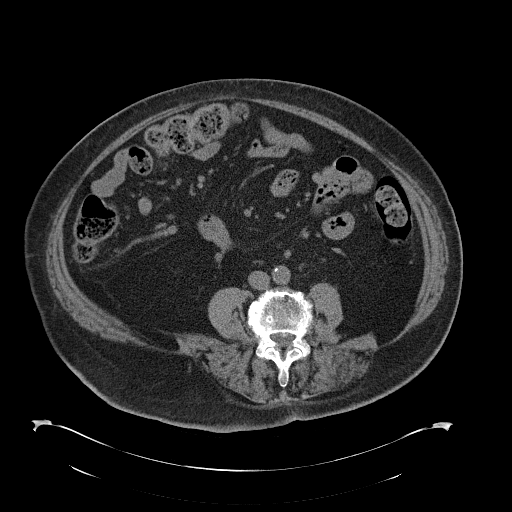
[im 53/92  soft-tissue]
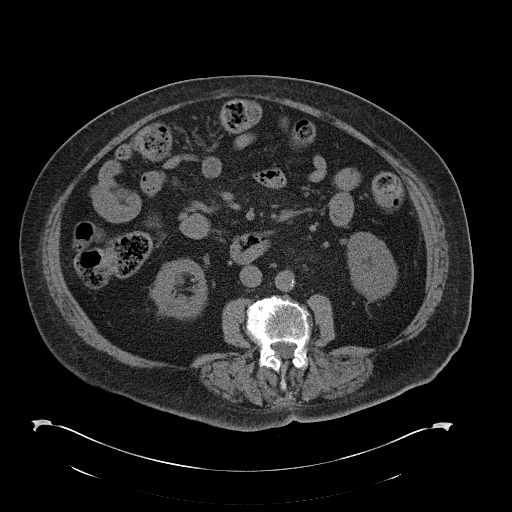
[im 53/92  bone]
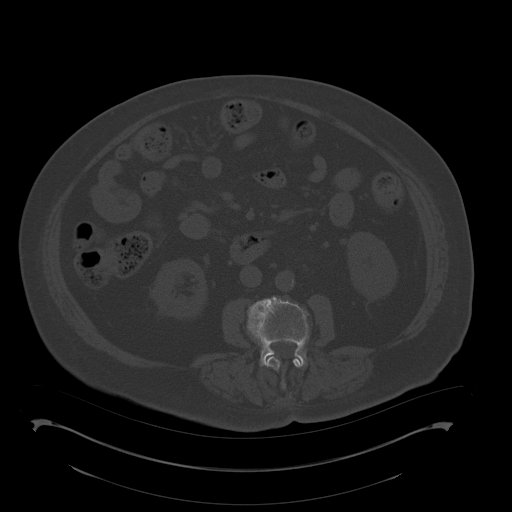
[im 59/92  soft-tissue]
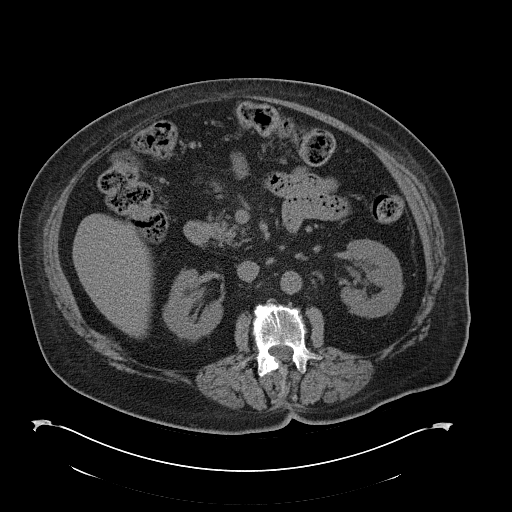
[im 65/92  soft-tissue]
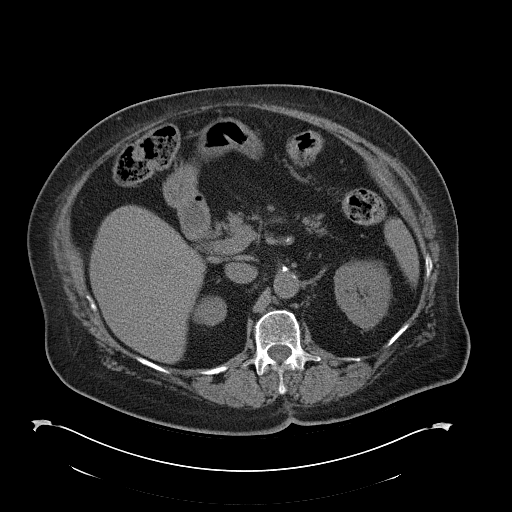
[im 71/92  soft-tissue]
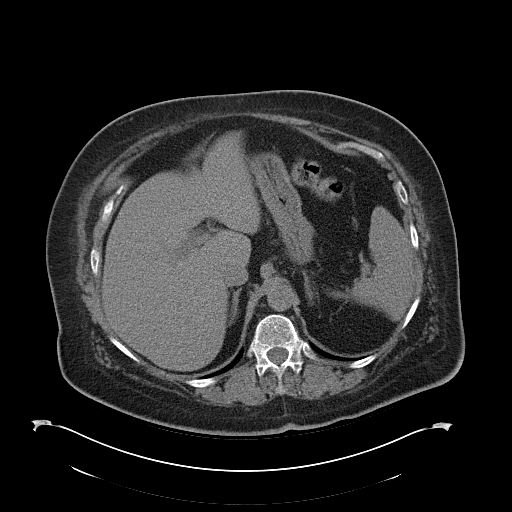
[im 77/92  soft-tissue]
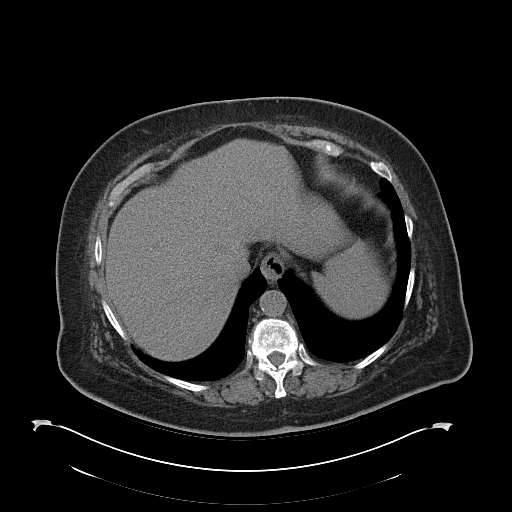
[im 83/92  soft-tissue]
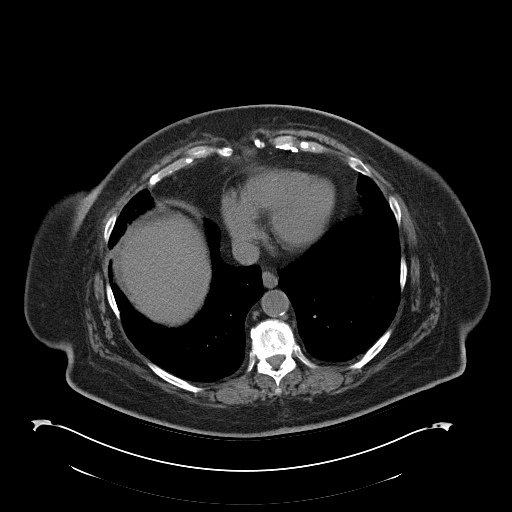
[im 89/92  soft-tissue]
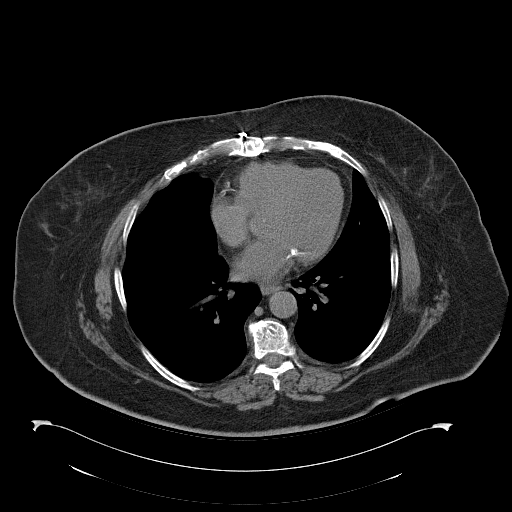

[Series 4: mpr coronal · coronal · 0.90mm/px · 3 of 102 slices shown]
[im 34/102  soft-tissue]
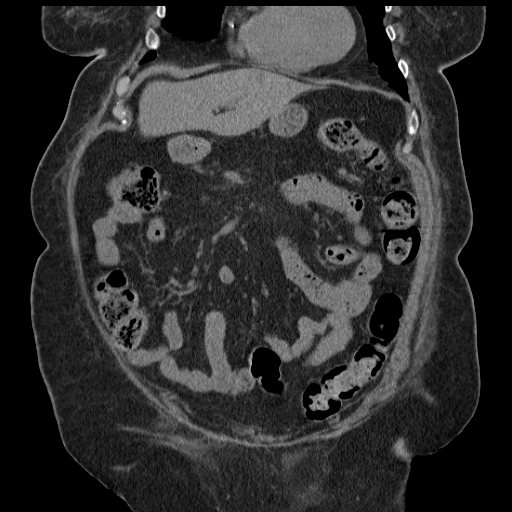
[im 45/102  soft-tissue]
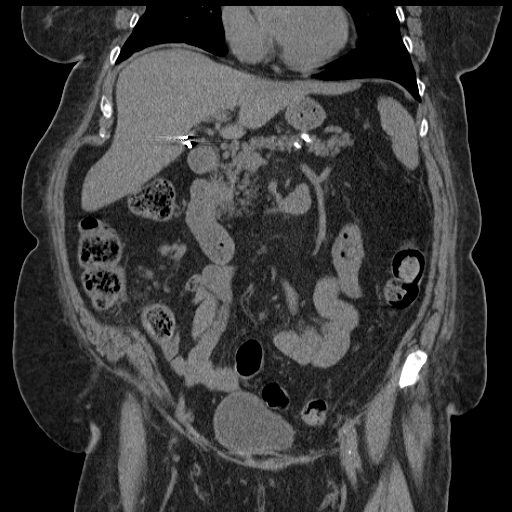
[im 57/102  soft-tissue]
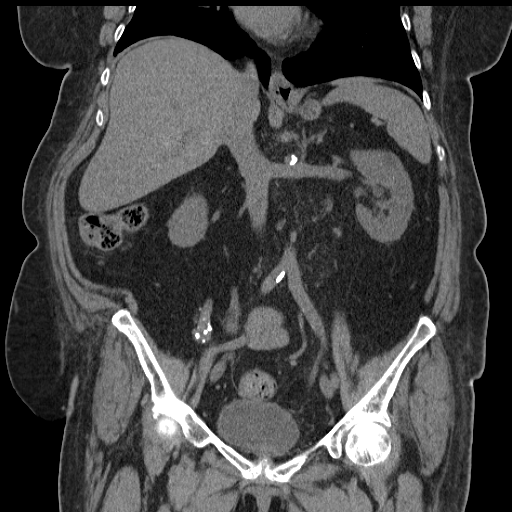

[18 of 46 positions shown; findings below may reference images not displayed]

FINDINGS: Cholecystectomy clips. Unremarkable non contrasted appearance of the
liver, spleen, pancreas, adrenal glands, kidneys and urinary
bladder. Surgically absent uterus and ovaries. No gastrointestinal
abnormalities or enlarged lymph nodes. No evidence of appendicitis.
Inferior right ovarian being dilatation and calcifications. Right
middle lobe calcified granuloma. Lumbar and lower thoracic spine
degenerative changes. Normal appearing hips.
IMPRESSION: No acute abnormality.

## 2016-08-01 ENCOUNTER — Observation Stay (HOSPITAL_COMMUNITY): Payer: Medicare HMO

## 2016-08-01 ENCOUNTER — Inpatient Hospital Stay (HOSPITAL_COMMUNITY)
Admission: EM | Admit: 2016-08-01 | Discharge: 2016-08-06 | DRG: 065 | Disposition: A | Discharge status: Home Health | Payer: Medicare HMO | Attending: Family Medicine | Admitting: Family Medicine

## 2016-08-01 ENCOUNTER — Emergency Department (HOSPITAL_COMMUNITY): Payer: Medicare HMO

## 2016-08-01 ENCOUNTER — Encounter (HOSPITAL_COMMUNITY): Payer: Self-pay | Admitting: Emergency Medicine

## 2016-08-01 DIAGNOSIS — I110 Hypertensive heart disease with heart failure: Secondary | ICD-10-CM | POA: Diagnosis present

## 2016-08-01 DIAGNOSIS — Z823 Family history of stroke: Secondary | ICD-10-CM

## 2016-08-01 DIAGNOSIS — Z955 Presence of coronary angioplasty implant and graft: Secondary | ICD-10-CM

## 2016-08-01 DIAGNOSIS — E030 Congenital hypothyroidism with diffuse goiter: Secondary | ICD-10-CM

## 2016-08-01 DIAGNOSIS — R200 Anesthesia of skin: Secondary | ICD-10-CM

## 2016-08-01 DIAGNOSIS — E039 Hypothyroidism, unspecified: Secondary | ICD-10-CM | POA: Diagnosis present

## 2016-08-01 DIAGNOSIS — E1142 Type 2 diabetes mellitus with diabetic polyneuropathy: Secondary | ICD-10-CM | POA: Diagnosis present

## 2016-08-01 DIAGNOSIS — E538 Deficiency of other specified B group vitamins: Secondary | ICD-10-CM | POA: Diagnosis present

## 2016-08-01 DIAGNOSIS — R531 Weakness: Secondary | ICD-10-CM | POA: Diagnosis not present

## 2016-08-01 DIAGNOSIS — I252 Old myocardial infarction: Secondary | ICD-10-CM

## 2016-08-01 DIAGNOSIS — Z91199 Patient's noncompliance with other medical treatment and regimen due to unspecified reason: Secondary | ICD-10-CM

## 2016-08-01 DIAGNOSIS — N390 Urinary tract infection, site not specified: Secondary | ICD-10-CM

## 2016-08-01 DIAGNOSIS — E118 Type 2 diabetes mellitus with unspecified complications: Secondary | ICD-10-CM | POA: Diagnosis not present

## 2016-08-01 DIAGNOSIS — I639 Cerebral infarction, unspecified: Secondary | ICD-10-CM | POA: Diagnosis not present

## 2016-08-01 DIAGNOSIS — Z87440 Personal history of urinary (tract) infections: Secondary | ICD-10-CM

## 2016-08-01 DIAGNOSIS — E669 Obesity, unspecified: Secondary | ICD-10-CM | POA: Diagnosis present

## 2016-08-01 DIAGNOSIS — Z8249 Family history of ischemic heart disease and other diseases of the circulatory system: Secondary | ICD-10-CM

## 2016-08-01 DIAGNOSIS — E02 Subclinical iodine-deficiency hypothyroidism: Secondary | ICD-10-CM

## 2016-08-01 DIAGNOSIS — Z6835 Body mass index (BMI) 35.0-35.9, adult: Secondary | ICD-10-CM

## 2016-08-01 DIAGNOSIS — G4733 Obstructive sleep apnea (adult) (pediatric): Secondary | ICD-10-CM

## 2016-08-01 DIAGNOSIS — I679 Cerebrovascular disease, unspecified: Secondary | ICD-10-CM

## 2016-08-01 DIAGNOSIS — J449 Chronic obstructive pulmonary disease, unspecified: Secondary | ICD-10-CM | POA: Diagnosis present

## 2016-08-01 DIAGNOSIS — G8191 Hemiplegia, unspecified affecting right dominant side: Secondary | ICD-10-CM | POA: Diagnosis present

## 2016-08-01 DIAGNOSIS — Z794 Long term (current) use of insulin: Secondary | ICD-10-CM

## 2016-08-01 DIAGNOSIS — Z951 Presence of aortocoronary bypass graft: Secondary | ICD-10-CM

## 2016-08-01 DIAGNOSIS — I10 Essential (primary) hypertension: Secondary | ICD-10-CM

## 2016-08-01 DIAGNOSIS — Z9119 Patient's noncompliance with other medical treatment and regimen: Secondary | ICD-10-CM

## 2016-08-01 DIAGNOSIS — Z9049 Acquired absence of other specified parts of digestive tract: Secondary | ICD-10-CM

## 2016-08-01 DIAGNOSIS — Z8673 Personal history of transient ischemic attack (TIA), and cerebral infarction without residual deficits: Secondary | ICD-10-CM

## 2016-08-01 DIAGNOSIS — E785 Hyperlipidemia, unspecified: Secondary | ICD-10-CM

## 2016-08-01 DIAGNOSIS — Z800 Family history of malignant neoplasm of digestive organs: Secondary | ICD-10-CM

## 2016-08-01 DIAGNOSIS — I2510 Atherosclerotic heart disease of native coronary artery without angina pectoris: Secondary | ICD-10-CM

## 2016-08-01 DIAGNOSIS — I5032 Chronic diastolic (congestive) heart failure: Secondary | ICD-10-CM | POA: Diagnosis present

## 2016-08-01 DIAGNOSIS — Z7982 Long term (current) use of aspirin: Secondary | ICD-10-CM

## 2016-08-01 DIAGNOSIS — Z832 Family history of diseases of the blood and blood-forming organs and certain disorders involving the immune mechanism: Secondary | ICD-10-CM

## 2016-08-01 LAB — GLUCOSE, CAPILLARY
Glucose-Capillary: 161 mg/dL — ABNORMAL HIGH (ref 65–99)
Glucose-Capillary: 91 mg/dL (ref 65–99)

## 2016-08-01 LAB — I-STAT CHEM 8, ED
BUN: 8 mg/dL (ref 6–20)
CREATININE: 0.7 mg/dL (ref 0.44–1.00)
Calcium, Ion: 1.21 mmol/L (ref 1.12–1.23)
Chloride: 101 mmol/L (ref 101–111)
GLUCOSE: 155 mg/dL — AB (ref 65–99)
HEMATOCRIT: 31 % — AB (ref 36.0–46.0)
Hemoglobin: 10.5 g/dL — ABNORMAL LOW (ref 12.0–15.0)
POTASSIUM: 3.8 mmol/L (ref 3.5–5.1)
Sodium: 140 mmol/L (ref 135–145)
TCO2: 27 mmol/L (ref 0–100)

## 2016-08-01 LAB — URINALYSIS, ROUTINE W REFLEX MICROSCOPIC
Bilirubin Urine: NEGATIVE
GLUCOSE, UA: 500 mg/dL — AB
Hgb urine dipstick: NEGATIVE
KETONES UR: NEGATIVE mg/dL
Nitrite: POSITIVE — AB
PH: 5 (ref 5.0–8.0)
SPECIFIC GRAVITY, URINE: 1.02 (ref 1.005–1.030)

## 2016-08-01 LAB — RAPID URINE DRUG SCREEN, HOSP PERFORMED
Amphetamines: NOT DETECTED
Barbiturates: NOT DETECTED
Benzodiazepines: NOT DETECTED
COCAINE: NOT DETECTED
OPIATES: NOT DETECTED
TETRAHYDROCANNABINOL: NOT DETECTED

## 2016-08-01 LAB — CBC
HCT: 31.4 % — ABNORMAL LOW (ref 36.0–46.0)
Hemoglobin: 8.6 g/dL — ABNORMAL LOW (ref 12.0–15.0)
MCH: 17.7 pg — ABNORMAL LOW (ref 26.0–34.0)
MCHC: 27.4 g/dL — ABNORMAL LOW (ref 30.0–36.0)
MCV: 64.6 fL — ABNORMAL LOW (ref 78.0–100.0)
PLATELETS: 338 10*3/uL (ref 150–400)
RBC: 4.86 MIL/uL (ref 3.87–5.11)
RDW: 18 % — ABNORMAL HIGH (ref 11.5–15.5)
WBC: 10.9 10*3/uL — AB (ref 4.0–10.5)

## 2016-08-01 LAB — DIFFERENTIAL
Basophils Absolute: 0.1 10*3/uL (ref 0.0–0.1)
Basophils Relative: 1 %
Eosinophils Absolute: 0.8 10*3/uL — ABNORMAL HIGH (ref 0.0–0.7)
Eosinophils Relative: 7 %
LYMPHS ABS: 3.4 10*3/uL (ref 0.7–4.0)
Lymphocytes Relative: 31 %
MONO ABS: 1.1 10*3/uL — AB (ref 0.1–1.0)
Monocytes Relative: 10 %
NEUTROS PCT: 51 %
Neutro Abs: 5.5 10*3/uL (ref 1.7–7.7)

## 2016-08-01 LAB — COMPREHENSIVE METABOLIC PANEL
ALBUMIN: 3.9 g/dL (ref 3.5–5.0)
ALT: 12 U/L — ABNORMAL LOW (ref 14–54)
ANION GAP: 4 — AB (ref 5–15)
AST: 16 U/L (ref 15–41)
Alkaline Phosphatase: 68 U/L (ref 38–126)
BILIRUBIN TOTAL: 0.4 mg/dL (ref 0.3–1.2)
BUN: 10 mg/dL (ref 6–20)
CO2: 27 mmol/L (ref 22–32)
Calcium: 8.7 mg/dL — ABNORMAL LOW (ref 8.9–10.3)
Chloride: 104 mmol/L (ref 101–111)
Creatinine, Ser: 0.63 mg/dL (ref 0.44–1.00)
GFR calc non Af Amer: >60 mL/min (ref >60)
GLUCOSE: 158 mg/dL — AB (ref 65–99)
POTASSIUM: 3.7 mmol/L (ref 3.5–5.1)
SODIUM: 135 mmol/L (ref 135–145)
TOTAL PROTEIN: 6.9 g/dL (ref 6.5–8.1)

## 2016-08-01 LAB — I-STAT TROPONIN, ED: Troponin i, poc: 0 ng/mL (ref 0.00–0.08)

## 2016-08-01 LAB — CBG MONITORING, ED: Glucose-Capillary: 163 mg/dL — ABNORMAL HIGH (ref 65–99)

## 2016-08-01 LAB — URINE MICROSCOPIC-ADD ON: RBC / HPF: NONE SEEN RBC/hpf (ref 0–5)

## 2016-08-01 LAB — APTT: APTT: 31 s (ref 24–36)

## 2016-08-01 LAB — PROTIME-INR
INR: 1.05
Prothrombin Time: 13.8 seconds (ref 11.4–15.2)

## 2016-08-01 LAB — ETHANOL: Alcohol, Ethyl (B): <5 mg/dL (ref <5)

## 2016-08-01 MED ORDER — OXYCODONE-ACETAMINOPHEN 10-325 MG PO TABS: 1.0000 | ORAL_TABLET | Freq: Four times a day (QID) | ORAL | Status: DC | PRN

## 2016-08-01 MED ORDER — LORAZEPAM 1 MG PO TABS
2.0000 mg | ORAL_TABLET | Freq: Every day | ORAL | Status: DC
  Administered 2016-08-01 – 2016-08-05 (×4): 2 mg via ORAL
  Filled 2016-08-01 (×4): qty 2

## 2016-08-01 MED ORDER — ALBUTEROL SULFATE (2.5 MG/3ML) 0.083% IN NEBU
2.5000 mg | INHALATION_SOLUTION | RESPIRATORY_TRACT | Status: DC | PRN
  Administered 2016-08-02: 2.5 mg via RESPIRATORY_TRACT
  Filled 2016-08-01: qty 3

## 2016-08-01 MED ORDER — STROKE: EARLY STAGES OF RECOVERY BOOK
Freq: Once | Status: DC
  Filled 2016-08-01: qty 1

## 2016-08-01 MED ORDER — INSULIN ASPART 100 UNIT/ML ~~LOC~~ SOLN
0.0000 [IU] | Freq: Three times a day (TID) | SUBCUTANEOUS | Status: DC
  Administered 2016-08-03: 5 [IU] via SUBCUTANEOUS
  Administered 2016-08-03 (×2): 3 [IU] via SUBCUTANEOUS
  Administered 2016-08-04 (×2): 2 [IU] via SUBCUTANEOUS
  Administered 2016-08-05: 3 [IU] via SUBCUTANEOUS
  Administered 2016-08-05: 5 [IU] via SUBCUTANEOUS
  Administered 2016-08-06: 3 [IU] via SUBCUTANEOUS
  Administered 2016-08-06: 5 [IU] via SUBCUTANEOUS

## 2016-08-01 MED ORDER — OXYCODONE-ACETAMINOPHEN 5-325 MG PO TABS
1.0000 | ORAL_TABLET | Freq: Four times a day (QID) | ORAL | Status: DC | PRN
  Administered 2016-08-01 – 2016-08-05 (×5): 1 via ORAL
  Filled 2016-08-01 (×5): qty 1

## 2016-08-01 MED ORDER — LEVOTHYROXINE SODIUM 25 MCG PO TABS
25.0000 ug | ORAL_TABLET | Freq: Every day | ORAL | Status: DC
Start: 2016-08-02 — End: 2016-08-06
  Administered 2016-08-02 – 2016-08-06 (×5): 25 ug via ORAL
  Filled 2016-08-01 (×5): qty 1

## 2016-08-01 MED ORDER — NITROGLYCERIN 0.4 MG SL SUBL: 0.4000 mg | SUBLINGUAL_TABLET | SUBLINGUAL | Status: DC | PRN

## 2016-08-01 MED ORDER — INSULIN ASPART PROT & ASPART (70-30 MIX) 100 UNIT/ML ~~LOC~~ SUSP
20.0000 [IU] | Freq: Three times a day (TID) | SUBCUTANEOUS | Status: DC
  Administered 2016-08-02 – 2016-08-06 (×8): 20 [IU] via SUBCUTANEOUS
  Filled 2016-08-01: qty 10

## 2016-08-01 MED ORDER — ASPIRIN 81 MG PO CHEW
162.0000 mg | CHEWABLE_TABLET | Freq: Once | ORAL | Status: AC
  Administered 2016-08-01: 162 mg via ORAL
  Filled 2016-08-01: qty 2

## 2016-08-01 MED ORDER — ASPIRIN EC 325 MG PO TBEC
325.0000 mg | DELAYED_RELEASE_TABLET | Freq: Every day | ORAL | Status: DC
  Administered 2016-08-02 – 2016-08-06 (×5): 325 mg via ORAL
  Filled 2016-08-01 (×5): qty 1

## 2016-08-01 MED ORDER — INSULIN ASPART 100 UNIT/ML ~~LOC~~ SOLN: 0.0000 [IU] | Freq: Every day | SUBCUTANEOUS | Status: DC

## 2016-08-01 MED ORDER — ONDANSETRON HCL 4 MG/2ML IJ SOLN
4.0000 mg | Freq: Three times a day (TID) | INTRAMUSCULAR | Status: AC | PRN
  Filled 2016-08-01: qty 2

## 2016-08-01 MED ORDER — METFORMIN HCL 500 MG PO TABS
500.0000 mg | ORAL_TABLET | Freq: Two times a day (BID) | ORAL | Status: DC
  Administered 2016-08-03 – 2016-08-06 (×6): 500 mg via ORAL
  Filled 2016-08-01 (×7): qty 1

## 2016-08-01 MED ORDER — PRAVASTATIN SODIUM 40 MG PO TABS
40.0000 mg | ORAL_TABLET | Freq: Every day | ORAL | Status: DC
  Administered 2016-08-01 – 2016-08-05 (×5): 40 mg via ORAL
  Filled 2016-08-01 (×6): qty 1

## 2016-08-01 MED ORDER — SENNOSIDES-DOCUSATE SODIUM 8.6-50 MG PO TABS
1.0000 | ORAL_TABLET | Freq: Every evening | ORAL | Status: DC | PRN
  Administered 2016-08-03: 1 via ORAL
  Filled 2016-08-01: qty 1

## 2016-08-01 MED ORDER — OXYCODONE HCL 5 MG PO TABS
5.0000 mg | ORAL_TABLET | Freq: Four times a day (QID) | ORAL | Status: DC | PRN
  Administered 2016-08-01 – 2016-08-05 (×5): 5 mg via ORAL
  Filled 2016-08-01 (×5): qty 1

## 2016-08-01 MED ORDER — ENOXAPARIN SODIUM 40 MG/0.4ML ~~LOC~~ SOLN
40.0000 mg | SUBCUTANEOUS | Status: DC
  Administered 2016-08-01 – 2016-08-05 (×5): 40 mg via SUBCUTANEOUS
  Filled 2016-08-01 (×5): qty 0.4

## 2016-08-01 NOTE — ED Notes (Signed)
MD at bedside. 

## 2016-08-01 NOTE — Progress Notes (Signed)
Call from triage at 1700, code stroke coming over. Arrived at CT 1705. Patient put on the table. Scan finished and patient being taken back to ER at 1710. Finished in EPIC and called GRA 1713. Results called to Dr Hyacinth MeekerMiller 1725. Finished in [facility] and [facility] PACS at [id number].

## 2016-08-01 NOTE — H&P (Signed)
History and Physical  [name] AuerRuby J Barra ZOX:[id number] DOB: 16-Mar-1936 DOA: 08/01/2016  Referring physician: Dr Hyacinth MeekerMiller, ED physician PCP: [name],[name], MD  Outpatient Specialists:   Endocrinology; Roma KayserGebreselassie W Nida, MD  Cardiology; Kathlen Brunswickobert M Rothbart, MD  Chief Complaint: Facial numbness and right arm weakness  HPI: [name] [name] is a 80 y.o. female with a history of hypertension, hypothyroidism, insulin-dependent type 2 diabetes, coronary artery disease with CABG in 2001, grade 1 diastolic heart failure, hyperlipidemia, history of CVA and TIAs with no residual deficit, obstructive sleep apnea, and chronic pain.  At 4 PM, the patient started having sudden onset of right facial numbness with numbness of her gums and throat with preservation of her tongue, along with heaviness of her right arm and that it is not working right. The patient went home and took 2 full dose aspirins and presented to the emergency department for evaluation. No palliating or provoking factors. No improvement in symptoms since arrival.  Of note, her mother and sister both died of strokes.  Review of Systems:   Pt denies any fevers, chills, nausea, vomiting, diarrhea, constipation, abdominal pain, shortness of breath, dyspnea on exertion, orthopnea, cough, wheezing, palpitations, headache, vision changes, lightheadedness, dizziness, melena, rectal bleeding.  Review of systems are otherwise negative  Past Medical History:  Diagnosis Date  . Arteriosclerotic cardiovascular disease (ASCVD) CARDIOLOGIST-   DR Dietrich PatesOTHBART    CABG surgery in 2001; nl stress nuclear-2007; angiography in 01/2006- TO of LAD; 70% ostial stenosis      of a small ramus; patent grafts; normal EF; cath in 12/2009 essentially unchanged; EF-66%;  Congestive heart failure with preserved LV systolic function  . Arthritis   . Chronic obstructive pulmonary disease (HCC)    Chronic bronchitis  . DDD (degenerative disc disease), lumbar   .  Diastolic CHF (HCC)   . Hematuria   . History of CVA (cerebrovascular accident)    2013--  LACUNAR INFARCTIONS RIGHT THALAMUS---  residual left side of  lip numb  and left eye vision worse (which corrented lens implant from cataract extraction)  . History of MI (myocardial infarction)    07/ 2001   s/p  cabg x2  . Hyperlipidemia   . Hypertension   . Hypothyroidism   . Insulin dependent type 2 diabetes mellitus (HCC)   . Lesion of bladder   . Lower urinary tract symptoms (LUTS)   . OSA (obstructive sleep apnea)   . Peripheral neuropathy (HCC)   . Psoriasis    lower leg  . S/P CABG x 2    07-02-2000  . SUI (stress urinary incontinence, female)   . Thinning of skin   . Varicose veins    Past Surgical History:  Procedure Laterality Date  . APPENDECTOMY  age 89  . CARDIAC CATHETERIZATION  1991   No sig. cad  . CARDIAC CATHETERIZATION  07-01-2000   high grade in-stent restenosis  . CARDIAC CATHETERIZATION  04-30-2001   single native vessel cad/ graft patent  . CARDIAC CATHETERIZATION  02-13-2006  dr Jenness Cornerbrodie   2V cad with total occluded LAD  and  70% ostial of small ramus/  grafts patent/  normal ef  . CARDIAC CATHETERIZATION  01-20-2000  dr Clifton Jamesmcalhany   essentially unchanged;  severe single vessel cad with diffuse disease throughout CFX and RCA/ ef 66%;  chf with preserved LVSF  . CARDIOVASCULAR STRESS TEST  08-28-2011   DR ROTHBART   NEGATIVE NUCLEAR STUDY/  GLOBAL LVSF/  EF 65%  . CARPAL TUNNEL  RELEASE Bilateral yrs ago  . CATARACT EXTRACTION W/ INTRAOCULAR LENS  IMPLANT, BILATERAL  2013  . CHOLECYSTECTOMY  1990  . COLONOSCOPY N/A 07/26/2015   Procedure: COLONOSCOPY;  Surgeon: Malissa Hippo, MD;  Location: [facility];  Service: Endoscopy;  Laterality: N/A;  1200  . CORONARY ANGIOPLASTY  11-13-1999   balloon angioplasty to mLAD , d2 of LAD  . CORONARY ANGIOPLASTY WITH STENT PLACEMENT  01-17-2000   PCI stenting to mLAD & D2 of LAD  . CORONARY ARTERY BYPASS GRAFT  07-02-2000    DR Tressie Stalker   SVG to diagonal, LIMA to LAD  . CYSTOSCOPY W/ RETROGRADES Bilateral 08/09/2014   Procedure: CYSTOSCOPY, BILATERAL RETROGRADE, HYDRODISTENSION, BLADDER BIOPSY WITH FULGERATION, INSTILL PYRIDIUM AND MARCAINE ;  Surgeon: Anner Crete, MD;  Location: [facility];  Service: Urology;  Laterality: Bilateral;  . KNEE ARTHROSCOPY Left 10-18-2003   SYNOVECTOMY  . LUMBAR SPINE SURGERY  x2    yrs ago  . ORIF HUMERUS FRACTURE Right 04-18-2008  . REPAIR FLEXOR TENDON HAND Right 09-20-2008   CARPI RADIALIS TENDON TRANSFER TO EXTENSOR TO INDEX, MIDDLE, RING , LITTLE FINGERS AND DIGITI MININI  . ROTATOR CUFF REPAIR Right 1995  . TOTAL ABDOMINAL HYSTERECTOMY W/ BILATERAL SALPINGOOPHORECTOMY  age 36  . TRANSTHORACIC ECHOCARDIOGRAM  12-12-2013   MILD LVH/  EF 60-65%/  GRADE I DIASTOLIC DYSFUNCTION/  MILD LAE   Social History:  reports that she has never smoked. She has never used smokeless tobacco. She reports that she does not drink alcohol or use drugs. Patient lives at home  Allergies  Allergen Reactions  . Codeine Itching    Family History  Problem Relation Age of Onset  . Hypertension Mother   . Stroke Mother   . Coronary artery disease      Multiple first and second-degree relatives  . Aneurysm      Cerebral circulation  . Colon cancer Sister   . Stroke Sister   . Coronary artery disease Sister   . Clotting disorder      Children diagnosed with hypercoagulable state  . Coronary artery disease Brother       Prior to Admission medications   Medication Sig Start Date End Date Taking? Authorizing Provider  albuterol (PROVENTIL) (2.5 MG/3ML) 0.083% nebulizer solution Take 3 mLs (2.5 mg total) by nebulization every 4 (four) hours as needed for wheezing or shortness of breath. 04/20/16  Yes Bethann Berkshire, MD  aspirin EC 325 MG tablet Take 325 mg by mouth daily.   Yes Historical Provider, MD  Insulin Degludec (TRESIBA FLEXTOUCH) 200 UNIT/ML SOPN Inject 35-70 Units  into the skin 2 (two) times daily. 35 units in the morning and 70 units in the evening   Yes Historical Provider, MD  levothyroxine (SYNTHROID, LEVOTHROID) 25 MCG tablet Take 25 mcg by mouth every morning.    Yes Historical Provider, MD  lisinopril (PRINIVIL,ZESTRIL) 10 MG tablet Take 1 tablet by mouth every morning.  11/09/13  Yes Historical Provider, MD  LORazepam (ATIVAN) 2 MG tablet Take 1 tablet by mouth at bedtime. 07/26/16  Yes Historical Provider, MD  metFORMIN (GLUCOPHAGE) 500 MG tablet Take 500 mg by mouth 2 (two) times daily with a meal.     Yes Historical Provider, MD  Multiple Vitamin (MULTIVITAMIN WITH MINERALS) TABS tablet Take 1 tablet by mouth daily.   Yes Historical Provider, MD  nitroGLYCERIN (NITROSTAT) 0.4 MG SL tablet Place 0.4 mg under the tongue every 5 (five) minutes as needed for  chest pain.    Yes Historical Provider, MD  NOVOLIN 70/30 RELION (70-30) 100 UNIT/ML injection Inject 20 Units into the skin 3 (three) times daily before meals. 02/25/16  Yes Historical Provider, MD  oxyCODONE-acetaminophen (PERCOCET) 10-325 MG tablet Take 1 tablet by mouth 4 (four) times daily as needed for pain.  07/26/16  Yes Historical Provider, MD  pravastatin (PRAVACHOL) 40 MG tablet Take 20 mg by mouth at bedtime.    Yes Historical Provider, MD    Physical Exam: BP 150/59 (BP Location: Right Arm)   Pulse 67   Temp 97.6 F (36.4 C)   Resp 16   Ht  (1.626 m)   Wt 93 kg (205 lb)   SpO2 94%   BMI 35.19 kg/m   General: Elderly Caucasian female. Awake and alert and oriented x3. No acute cardiopulmonary distress.  HEENT: Normocephalic atraumatic.  Right and left ears normal in appearance.  Pupils equal, round, reactive to light. Extraocular muscles are intact. Sclerae anicteric and noninjected.  Moist mucosal membranes. No mucosal lesions.  Neck: Neck supple without lymphadenopathy. No carotid bruits. No masses palpated.  Cardiovascular: Regular rate with normal S1-S2 sounds. No murmurs,  rubs, gallops auscultated. No JVD.  Respiratory: Good respiratory effort with no wheezes, rales, rhonchi. Lungs clear to auscultation bilaterally.  No accessory muscle use. Abdomen: Soft, nontender, nondistended. Active bowel sounds. No masses or hepatosplenomegaly  Skin: Patchy psoriatic plaques on right shin. No other rashes, lesions, or ulcerations.  Dry, warm to touch. 2+ dorsalis pedis and radial pulses. Musculoskeletal: No calf or leg pain. All major joints not erythematous nontender.  No upper or lower joint deformation.  Good ROM.  No contractures  Psychiatric: Intact judgment and insight. Pleasant and cooperative.  Neurologic: No focal neurological deficits. Strength is 5/5 and symmetric in upper and lower extremities.  Cranial nerve V is diminished on the right, along with numbness of the right neck down to the collarbone. All other cranial nerves are intact bilaterally.           Labs on Admission: I have personally reviewed following labs and imaging studies  CBC:  Recent Labs Lab 08/01/16 1715 08/01/16 1722  WBC 10.9*  --   NEUTROABS 5.5  --   HGB 8.6* 10.5*  HCT 31.4* 31.0*  MCV 64.6*  --   PLT 338  --    Basic Metabolic Panel:  Recent Labs Lab 08/01/16 1715 08/01/16 1722  NA 135 140  K 3.7 3.8  CL 104 101  CO2 27  --   GLUCOSE 158* 155*  BUN 10 8  CREATININE 0.63 0.70  CALCIUM 8.7*  --    GFR: Estimated Creatinine Clearance: 65.1 mL/min (by C-G formula based on SCr of 0.8 mg/dL). Liver Function Tests:  Recent Labs Lab 08/01/16 1715  AST 16  ALT 12*  ALKPHOS 68  BILITOT 0.4  PROT 6.9  ALBUMIN 3.9   No results for input(s): LIPASE, AMYLASE in the last 168 hours. No results for input(s): AMMONIA in the last 168 hours. Coagulation Profile:  Recent Labs Lab 08/01/16 1715  INR 1.05   Cardiac Enzymes: No results for input(s): CKTOTAL, CKMB, CKMBINDEX, TROPONINI in the last 168 hours. BNP (last 3 results) No results for input(s): PROBNP in the  last 8760 hours. HbA1C: No results for input(s): HGBA1C in the last 72 hours. CBG:  Recent Labs Lab 08/01/16 1716  GLUCAP 163*   Lipid Profile: No results for input(s): CHOL, HDL, LDLCALC, TRIG, CHOLHDL, LDLDIRECT in  the last 72 hours. Thyroid Function Tests: No results for input(s): TSH, T4TOTAL, FREET4, T3FREE, THYROIDAB in the last 72 hours. Anemia Panel: No results for input(s): VITAMINB12, FOLATE, FERRITIN, TIBC, IRON, RETICCTPCT in the last 72 hours. Urine analysis:    Component Value Date/Time   COLORURINE YELLOW 04/22/2016 0319   APPEARANCEUR CLEAR 04/22/2016 0319   LABSPEC 1.020 04/22/2016 0319   PHURINE 5.0 04/22/2016 0319   GLUCOSEU >1000 (A) 04/22/2016 0319   HGBUR NEGATIVE 04/22/2016 0319   BILIRUBINUR NEGATIVE 04/22/2016 0319   KETONESUR NEGATIVE 04/22/2016 0319   PROTEINUR NEGATIVE 04/22/2016 0319   UROBILINOGEN 0.2 08/29/2015 1842   NITRITE NEGATIVE 04/22/2016 0319   LEUKOCYTESUR NEGATIVE 04/22/2016 0319   Sepsis Labs: @LABRCNTIP (procalcitonin:4,lacticidven:4) )No results found for this or any previous visit (from the past 240 hour(s)).   Radiological Exams on Admission: Ct Head Code Stroke W/o Cm  Result Date: 08/01/2016 CLINICAL DATA:  Code stroke. Right-sided facial numbness and tingling down the right side of the throat. Right arm weakness. EXAM: CT HEAD WITHOUT CONTRAST TECHNIQUE: Contiguous axial images were obtained from the base of the skull through the vertex without intravenous contrast. COMPARISON:  Head CT and MRI 04/04/2016 FINDINGS: The study is mildly motion degraded without evidence of acute intracranial hemorrhage are acute large territory infarct within this limitation. No mass, midline shift, or extra-axial fluid collection is seen. Cerebral atrophy is unchanged. Cerebral white matter hypodensities are unchanged and nonspecific but compatible with moderate chronic small vessel ischemic disease. Prior bilateral cataract extraction is noted.  There is opacification of a posterior right ethmoid air cell. Chronic inferior left mastoid air cell opacification is unchanged. Calcified atherosclerosis is noted at the skullbase. No acute osseous abnormality is identified. ASPECTS [facility] Stroke Program Early CT Score, [email or web address]) - Ganglionic level infarction (caudate, lentiform nuclei, internal capsule, insula, M1-M3 cortex): 7 - Supraganglionic infarction (M4-M6 cortex): 3 Total score (0-10 with 10 being normal): 10 IMPRESSION: 1. Mild motion degradation without evidence of acute intracranial abnormality. 2. ASPECTS score 10. 3. Moderate chronic small vessel ischemic disease. These results were called by telephone at the time of interpretation on 08/01/2016 at 5:25 pm to Dr. Eber Hong , who verbally acknowledged these results. Electronically Signed   By: Sebastian Ache M.D.   On: 08/01/2016 17:26    EKG: Independently reviewed. Sinus rhythm with PVCs. Slightly prolonged PR interval, old inferior MI and possible old anteroseptal MI. No acute ST changes.  Assessment/Plan: Principal Problem:   Stroke [facility]) Active Problems:   Hypothyroidism   DM type 2 causing complication (HCC)   Essential hypertension   OSA (obstructive sleep apnea)    This patient was discussed with the ED physician, including pertinent vitals, physical exam findings, labs, and imaging.  We also discussed care given by the ED provider.  #1 stroke symptoms  Observation on telemetry for stroke workup  MRI/MRA in the morning  Echocardiogram  Carotid Dopplers  Lipid panel  Allow for permissive hypertension  Continue daily aspirin  DVT prophylaxis  PT/OT /speech therapy tomorrow #2 diabetes type 2 insulin-dependent  Although 70/30 mix 3 times a day with meals is an abnormal regimen, the patient has been tolerating this. This will need to be clarified with her primary doctor tomorrow.  CBGs before meals and daily at bedtime    Sliding-scale insulin #3 essential hypertension  Hold lisinopril to allow for permissive hypertension #4 hypothyroidism  Continue Synthroid #5 obstructive sleep apnea  Consult respiratory for C Pap  DVT prophylaxis: Lovenox Consultants:  None Code Status: Full code Family Communication: None  Disposition Plan: Likely return home tomorrow   Levie Heritage, DO [facility] Pager [phone]  If 7PM-7AM, please contact night-coverage [email or web address] Password [email or web address]

## 2016-08-01 NOTE — ED Notes (Signed)
History obtained by husband while pt is in CT scan.

## 2016-08-01 NOTE — ED Notes (Addendum)
Pt reports right sided facial numbness and tingling down right side of throat since 1600, Dr. Hyacinth MeekerMiller performed exam in hallway and cleared for CT.  Pt arrived in CT at 1706.  Does not have any slurred speech at this time. Pt has decreased sensation to right face, right arm, and right leg.  Pt alert.

## 2016-08-01 NOTE — ED Notes (Signed)
Neuro tele assessment in progress at this time.

## 2016-08-01 NOTE — ED Notes (Signed)
Pt placed on 2L 02 [place] for 02 88%.

## 2016-08-01 NOTE — Progress Notes (Signed)
Patient refused CPAP for tonight.  States that she does not like the fit of our masks.

## 2016-08-01 NOTE — ED Notes (Signed)
Physician at bedside.

## 2016-08-01 NOTE — ED Provider Notes (Signed)
[facility] Provider Note   CSN: [id number] Arrival date & time: 08/01/16  1703  First Provider Contact:  None       History   Chief Complaint Chief Complaint  Patient presents with  . Code Stroke    HPI [name] [name] is a 80 y.o. female.  The patient is an 80 year old female, she is a diabetic, she has a history of hypertension as well as a history of stroke and several transient ischemic attacks in the past. She currently takes aspirin, she does not take any other blood thinners. Of note the patient had a CABG in 2001, she has congestive heart failure with preserved left ventricular function. She presents to the hospital with a complaint of right facial numbness, right arm which does not feel normal. This started approximately 1 hour ago. She reports that she feels these symptoms in the right side of her face, inside her mouth on her gums, her teeth, her tongue and feels that when she swallows she cannot feel it. She states that her right arm does not feel normal, she feels like it is not working right but cannot be more specific with her description. Again the acute onset at 4:00 PM is what the patient reports. She took 2 baby aspirin prior to coming to the hospital.: Symptoms are persistent, moderate to severe, nothing seems to make it better or worse.      Past Medical History:  Diagnosis Date  . Arteriosclerotic cardiovascular disease (ASCVD) CARDIOLOGIST-   DR Dietrich Pates    CABG surgery in 2001; nl stress nuclear-2007; angiography in 01/2006- TO of LAD; 70% ostial stenosis      of a small ramus; patent grafts; normal EF; cath in 12/2009 essentially unchanged; EF-66%;  Congestive heart failure with preserved LV systolic function  . Arthritis   . Chronic obstructive pulmonary disease (HCC)    Chronic bronchitis  . DDD (degenerative disc disease), lumbar   . Diastolic CHF (HCC)   . Hematuria   . History of CVA (cerebrovascular accident)    2013--  LACUNAR INFARCTIONS  RIGHT THALAMUS---  residual left side of  lip numb  and left eye vision worse (which corrented lens implant from cataract extraction)  . History of MI (myocardial infarction)    07/ 2001   s/p  cabg x2  . Hyperlipidemia   . Hypertension   . Hypothyroidism   . Insulin dependent type 2 diabetes mellitus (HCC)   . Lesion of bladder   . Lower urinary tract symptoms (LUTS)   . OSA (obstructive sleep apnea)   . Peripheral neuropathy (HCC)   . Psoriasis    lower leg  . S/P CABG x 2    07-02-2000  . SUI (stress urinary incontinence, female)   . Thinning of skin   . Varicose veins     Patient Active Problem List   Diagnosis Date Noted  . Stroke (HCC) 08/01/2016  . Dysarthria 03/18/2016  . Influenza-like illness 11/19/2015  . Anemia of chronic disease 11/19/2015  . OSA (obstructive sleep apnea) 11/19/2015  . Left facial numbness 08/29/2015  . Gait instability 06/26/2015  . Bronchitis 11/13/2014  . COPD exacerbation (HCC) 11/13/2014  . UTI (urinary tract infection) 08/26/2014  . Near syncope 08/24/2014  . UTI (lower urinary tract infection) 12/10/2013  . Hypoxia 12/10/2013  . Weakness generalized 12/10/2013  . TIA (transient ischemic attack) 06/22/2012  . Left-sided headache 06/22/2012  . Noncompliance 06/22/2012  . Noncompliance with CPAP treatment 06/22/2012  . Psoriasis  01/07/2012  . Cerebrovascular disease   . Chronic back pain   . Chronic obstructive pulmonary disease (HCC)   . DM type 2 causing complication (HCC) 02/14/2010  . Obesity 02/14/2010  . Arteriosclerotic cardiovascular disease (ASCVD) 02/14/2010  . Hypothyroidism 02/06/2010  . Hyperlipidemia 02/06/2010  . Essential hypertension 02/06/2010  . Sleep apnea 02/06/2010    Past Surgical History:  Procedure Laterality Date  . APPENDECTOMY  age 27  . CARDIAC CATHETERIZATION  1991   No sig. cad  . CARDIAC CATHETERIZATION  07-01-2000   high grade in-stent restenosis  . CARDIAC CATHETERIZATION  04-30-2001    single native vessel cad/ graft patent  . CARDIAC CATHETERIZATION  02-13-2006  dr [place] cad with total occluded LAD  and  70% ostial of small ramus/  grafts patent/  normal ef  . CARDIAC CATHETERIZATION  01-20-2000  dr Clifton James   essentially unchanged;  severe single vessel cad with diffuse disease throughout CFX and RCA/ ef 66%;  chf with preserved LVSF  . CARDIOVASCULAR STRESS TEST  08-28-2011   DR ROTHBART   NEGATIVE NUCLEAR STUDY/  GLOBAL LVSF/  EF 65%  . CARPAL TUNNEL RELEASE Bilateral yrs ago  . CATARACT EXTRACTION W/ INTRAOCULAR LENS  IMPLANT, BILATERAL  2013  . CHOLECYSTECTOMY  1990  . COLONOSCOPY N/A 07/26/2015   Procedure: COLONOSCOPY;  Surgeon: Malissa Hippo, MD;  Location: [facility];  Service: Endoscopy;  Laterality: N/A;  1200  . CORONARY ANGIOPLASTY  11-13-1999   balloon angioplasty to mLAD , d2 of LAD  . CORONARY ANGIOPLASTY WITH STENT PLACEMENT  01-17-2000   PCI stenting to mLAD & D2 of LAD  . CORONARY ARTERY BYPASS GRAFT  07-02-2000   DR Tressie Stalker   SVG to diagonal, LIMA to LAD  . CYSTOSCOPY W/ RETROGRADES Bilateral 08/09/2014   Procedure: CYSTOSCOPY, BILATERAL RETROGRADE, HYDRODISTENSION, BLADDER BIOPSY WITH FULGERATION, INSTILL PYRIDIUM AND MARCAINE ;  Surgeon: Anner Crete, MD;  Location: [facility];  Service: Urology;  Laterality: Bilateral;  . KNEE ARTHROSCOPY Left 10-18-2003   SYNOVECTOMY  . LUMBAR SPINE SURGERY  x2    yrs ago  . ORIF HUMERUS FRACTURE Right 04-18-2008  . REPAIR FLEXOR TENDON HAND Right 09-20-2008   CARPI RADIALIS TENDON TRANSFER TO EXTENSOR TO INDEX, MIDDLE, RING , LITTLE FINGERS AND DIGITI MININI  . ROTATOR CUFF REPAIR Right 1995  . TOTAL ABDOMINAL HYSTERECTOMY W/ BILATERAL SALPINGOOPHORECTOMY  age 66  . TRANSTHORACIC ECHOCARDIOGRAM  12-12-2013   MILD LVH/  EF 60-65%/  GRADE I DIASTOLIC DYSFUNCTION/  MILD LAE    OB History    Gravida Para Term Preterm AB Living   7 7 7     4    SAB TAB Ectopic Multiple Live Births                    Home Medications    Prior to Admission medications   Medication Sig Start Date End Date Taking? Authorizing Provider  albuterol (PROVENTIL) (2.5 MG/3ML) 0.083% nebulizer solution Take 3 mLs (2.5 mg total) by nebulization every 4 (four) hours as needed for wheezing or shortness of breath. 04/20/16  Yes Bethann Berkshire, MD  aspirin EC 325 MG tablet Take 325 mg by mouth daily.   Yes Historical Provider, MD  Insulin Degludec (TRESIBA FLEXTOUCH) 200 UNIT/ML SOPN Inject 35-70 Units into the skin 2 (two) times daily. 35 units in the morning and 70 units in the evening   Yes Historical Provider, MD  levothyroxine (  SYNTHROID, LEVOTHROID) 25 MCG tablet Take 25 mcg by mouth every morning.    Yes Historical Provider, MD  lisinopril (PRINIVIL,ZESTRIL) 10 MG tablet Take 1 tablet by mouth every morning.  11/09/13  Yes Historical Provider, MD  LORazepam (ATIVAN) 2 MG tablet Take 1 tablet by mouth at bedtime. 07/26/16  Yes Historical Provider, MD  metFORMIN (GLUCOPHAGE) 500 MG tablet Take 500 mg by mouth 2 (two) times daily with a meal.     Yes Historical Provider, MD  Multiple Vitamin (MULTIVITAMIN WITH MINERALS) TABS tablet Take 1 tablet by mouth daily.   Yes Historical Provider, MD  nitroGLYCERIN (NITROSTAT) 0.4 MG SL tablet Place 0.4 mg under the tongue every 5 (five) minutes as needed for chest pain.    Yes Historical Provider, MD  NOVOLIN 70/30 RELION (70-30) 100 UNIT/ML injection Inject 20 Units into the skin 3 (three) times daily before meals. 02/25/16  Yes Historical Provider, MD  oxyCODONE-acetaminophen (PERCOCET) 10-325 MG tablet Take 1 tablet by mouth 4 (four) times daily as needed for pain.  07/26/16  Yes Historical Provider, MD  pravastatin (PRAVACHOL) 40 MG tablet Take 20 mg by mouth at bedtime.    Yes Historical Provider, MD  predniSONE (DELTASONE) 20 MG tablet Take 1 tablet (20 mg total) by mouth daily with breakfast. Patient not taking: Reported on 08/01/2016 04/26/16   Oval Linseyichard  [name], MD    Family History Family History  Problem Relation Age of Onset  . Hypertension Mother   . Coronary artery disease      Multiple first and second-degree relatives  . Aneurysm      Cerebral circulation  . Colon cancer Sister   . Clotting disorder      Children diagnosed with hypercoagulable state    Social History Social History  Substance Use Topics  . Smoking status: Never Smoker  . Smokeless tobacco: Never Used  . Alcohol use No     Allergies   Codeine   Review of Systems Review of Systems  All other systems reviewed and are negative.    Physical Exam Updated Vital Signs BP (!) 131/115   Pulse 66   Temp 97.6 F (36.4 C)   Resp 17   Ht 5\' 4"  (1.626 m)   Wt 205 lb (93 kg)   SpO2 97%   BMI 35.19 kg/m   Physical Exam  Constitutional: She appears well-developed and well-nourished. No distress.  HENT:  Head: Normocephalic and atraumatic.  Mouth/Throat: Oropharynx is clear and moist. No oropharyngeal exudate.  Eyes: Conjunctivae and EOM are normal. Pupils are equal, round, and reactive to light. Right eye exhibits no discharge. Left eye exhibits no discharge. No scleral icterus.  Neck: Normal range of motion. Neck supple. No JVD present. No thyromegaly present.  Cardiovascular: Normal rate, regular rhythm, normal heart sounds and intact distal pulses.  Exam reveals no gallop and no friction rub.   No murmur heard. Pulmonary/Chest: Effort normal and breath sounds normal. No respiratory distress. She has no wheezes. She has no rales.  Abdominal: Soft. Bowel sounds are normal. She exhibits no distension and no mass. There is no tenderness.  Musculoskeletal: Normal range of motion. She exhibits no edema or tenderness.  Lymphadenopathy:    She has no cervical adenopathy.  Neurological: She is alert. Coordination normal.  Numbness to light touch over the right side of the face, right neck, right clavicle right shoulder. Normal sensation otherwise  diffusely, no facial droop, no difficulty with speech, cranial nerves III through XII are normal,  strength is normal in all 4 extremities, no pronator drift  Skin: Skin is warm and dry. No rash noted. No erythema.  Psychiatric: She has a normal mood and affect. Her behavior is normal.  Nursing note and vitals reviewed.    ED Treatments / Results  Labs (all labs ordered are listed, but only abnormal results are displayed) Labs Reviewed  CBC - Abnormal; Notable for the following:       Result Value   WBC 10.9 (*)    Hemoglobin 8.6 (*)    HCT 31.4 (*)    MCV 64.6 (*)    MCH 17.7 (*)    MCHC 27.4 (*)    RDW 18.0 (*)    All other components within normal limits  DIFFERENTIAL - Abnormal; Notable for the following:    Monocytes Absolute 1.1 (*)    Eosinophils Absolute 0.8 (*)    All other components within normal limits  COMPREHENSIVE METABOLIC PANEL - Abnormal; Notable for the following:    Glucose, Bld 158 (*)    Calcium 8.7 (*)    ALT 12 (*)    Anion gap 4 (*)    All other components within normal limits  I-STAT CHEM 8, ED - Abnormal; Notable for the following:    Glucose, Bld 155 (*)    Hemoglobin 10.5 (*)    HCT 31.0 (*)    All other components within normal limits  CBG MONITORING, ED - Abnormal; Notable for the following:    Glucose-Capillary 163 (*)    All other components within normal limits  ETHANOL  PROTIME-INR  APTT  URINE RAPID DRUG SCREEN, HOSP PERFORMED  URINALYSIS, ROUTINE W REFLEX MICROSCOPIC (NOT AT [facility])  Rosezena Sensor, ED     EKG Interpretation  Date/Time:  Thursday August 01 2016 17:16:54 EDT Ventricular Rate:  81 PR Interval:    QRS Duration: 104 QT Interval:  407 QTC Calculation: 436 R Axis:   -48 Text Interpretation:  Sinus rhythm Paired ventricular premature complexes Prolonged PR interval Inferior infarct, old Probable anteroseptal infarct, old Lateral leads are also involved Since last tracing rate slower Confirmed by Aymen Widrig  MD, Mackenze Grandison  [phone]) on 08/01/2016 5:23:51 PM       Radiology Ct Head Code Stroke W/o Cm  Result Date: 08/01/2016 CLINICAL DATA:  Code stroke. Right-sided facial numbness and tingling down the right side of the throat. Right arm weakness. EXAM: CT HEAD WITHOUT CONTRAST TECHNIQUE: Contiguous axial images were obtained from the base of the skull through the vertex without intravenous contrast. COMPARISON:  Head CT and MRI 04/04/2016 FINDINGS: The study is mildly motion degraded without evidence of acute intracranial hemorrhage are acute large territory infarct within this limitation. No mass, midline shift, or extra-axial fluid collection is seen. Cerebral atrophy is unchanged. Cerebral white matter hypodensities are unchanged and nonspecific but compatible with moderate chronic small vessel ischemic disease. Prior bilateral cataract extraction is noted. There is opacification of a posterior right ethmoid air cell. Chronic inferior left mastoid air cell opacification is unchanged. Calcified atherosclerosis is noted at the skullbase. No acute osseous abnormality is identified. ASPECTS [facility] Stroke Program Early CT Score, [email or web address]) - Ganglionic level infarction (caudate, lentiform nuclei, internal capsule, insula, M1-M3 cortex): 7 - Supraganglionic infarction (M4-M6 cortex): 3 Total score (0-10 with 10 being normal): 10 IMPRESSION: 1. Mild motion degradation without evidence of acute intracranial abnormality. 2. ASPECTS score 10. 3. Moderate chronic small vessel ischemic disease. These results were called by telephone at the time of interpretation  on 08/01/2016 at 5:25 pm to Dr. Eber Hong , who verbally acknowledged these results. Electronically Signed   By: Sebastian Ache M.D.   On: 08/01/2016 17:26    Procedures Procedures (including critical care time)  Medications Ordered in ED Medications  aspirin chewable tablet 162 mg (162 mg Oral Given 08/01/16 1849)     Initial Impression /  Assessment and Plan / ED Course  I have reviewed the triage vital signs and the nursing notes.  Pertinent labs & imaging results that were available during my care of the patient were reviewed by me and considered in my medical decision making (see chart for details).  Clinical Course  Comment By Time  Discussed care with the Specialist on call, they recommend admitting the patient to the hospital for further evaluation and workup of stroke, no indication for thrombolytic therapy, will get 2 more baby aspirin Eber Hong, MD 08/10 1837  D/w Dr. Adrian Blackwater who will agree to admit Tele Obs holding orders written Eber Hong, MD 08/10 1851      Final Clinical Impressions(s) / ED Diagnoses   Final diagnoses:  Rt facial numbness    New Prescriptions New Prescriptions   No medications on file     Eber Hong, MD 08/01/16 [phone]

## 2016-08-01 NOTE — ED Notes (Signed)
Patient's husband called triage and report patient having right sided facial numbness to checks. I advised husband to call 911 and get patient here as soon as possible for evaluation of stroke like symptoms. Patient acknowledged this nurse's concerns. Phone call was received at 1645.

## 2016-08-02 ENCOUNTER — Observation Stay (HOSPITAL_COMMUNITY): Payer: Medicare HMO

## 2016-08-02 ENCOUNTER — Observation Stay (HOSPITAL_BASED_OUTPATIENT_CLINIC_OR_DEPARTMENT_OTHER): Payer: Medicare HMO

## 2016-08-02 DIAGNOSIS — I6350 Cerebral infarction due to unspecified occlusion or stenosis of unspecified cerebral artery: Secondary | ICD-10-CM

## 2016-08-02 LAB — ECHOCARDIOGRAM COMPLETE
AO mean calculated velocity dopler: 125 cm/s
AOPV: 0.59 m/s
AV Area VTI index: 0.9 cm2/m2
AV Area VTI: 1.68 cm2
AV Area mean vel: 1.82 cm2
AV Mean grad: 8 mmHg
AV Peak grad: 17 mmHg
AV peak Index: 0.8
AV pk vel: 204 cm/s
AV vel: 1.89
AVA: 1.89 cm2
AVAREAMEANVIN: 0.87 cm2/m2
AVCELMEANRAT: 0.64
CHL CUP DOP CALC LVOT VTI: 28.6 cm
CHL CUP STROKE VOLUME: 33 mL
E decel time: 345 msec
E/e' ratio: 15.13
FS: 33 % (ref 28–44)
Height: 64 in
IVS/LV PW RATIO, ED: 0.95
LA ID, A-P, ES: 36 mm
LA vol index: 20.5 mL/m2
LA vol: 42.8 mL
LADIAMINDEX: 1.72 cm/m2
LAVOLA4C: 52.2 mL
LDCA: 2.84 cm2
LEFT ATRIUM END SYS DIAM: 36 mm
LV E/e' medial: 15.13
LV SIMPSON'S DISK: 62
LV TDI E'LATERAL: 5.87
LV TDI E'MEDIAL: 4.35
LV dias vol index: 26 mL/m2
LV dias vol: 53 mL (ref 46–106)
LVEEAVG: 15.13
LVELAT: 5.87 cm/s
LVOT SV: 81 mL
LVOT diameter: 19 mm
LVOT peak grad rest: 6 mmHg
LVOT peak vel: 121 cm/s
LVOTVTI: 0.67 cm
LVSYSVOL: 20 mL (ref 14–42)
LVSYSVOLIN: 10 mL/m2
Lateral S' vel: 11.9 cm/s
MV Dec: 345
MV Peak grad: 3 mmHg
MV pk A vel: 123 m/s
MVPKEVEL: 88.8 m/s
PW: 14.9 mm — AB (ref 0.6–1.1)
TAPSE: 16.1 mm
VTI: 43 cm
Valve area index: 0.9
Weight: 3280 oz

## 2016-08-02 LAB — VITAMIN B12: Vitamin B-12: 156 pg/mL — ABNORMAL LOW (ref 180–914)

## 2016-08-02 LAB — GLUCOSE, CAPILLARY
GLUCOSE-CAPILLARY: 196 mg/dL — AB (ref 65–99)
GLUCOSE-CAPILLARY: 206 mg/dL — AB (ref 65–99)
GLUCOSE-CAPILLARY: 174 mg/dL — AB (ref 65–99)
Glucose-Capillary: 207 mg/dL — ABNORMAL HIGH (ref 65–99)
Glucose-Capillary: 233 mg/dL — ABNORMAL HIGH (ref 65–99)
Glucose-Capillary: 181 mg/dL — ABNORMAL HIGH (ref 65–99)

## 2016-08-02 LAB — LIPID PANEL
CHOLESTEROL: 162 mg/dL (ref 0–200)
HDL: 27 mg/dL — ABNORMAL LOW (ref >40)
LDL Cholesterol: 72 mg/dL (ref 0–99)
Total CHOL/HDL Ratio: 6 RATIO
Triglycerides: 314 mg/dL — ABNORMAL HIGH (ref <150)
VLDL: 63 mg/dL — AB (ref 0–40)

## 2016-08-02 LAB — TSH: TSH: 1.116 u[IU]/mL (ref 0.350–4.500)

## 2016-08-02 MED ORDER — ONDANSETRON HCL 4 MG/2ML IJ SOLN
4.0000 mg | INTRAMUSCULAR | Status: DC | PRN
  Administered 2016-08-02 (×3): 4 mg via INTRAVENOUS
  Filled 2016-08-02 (×2): qty 2

## 2016-08-02 MED ORDER — INSULIN ASPART PROT & ASPART (70-30 MIX) 100 UNIT/ML ~~LOC~~ SUSP
10.0000 [IU] | Freq: Once | SUBCUTANEOUS | Status: AC
  Administered 2016-08-02: 10 [IU] via SUBCUTANEOUS
  Filled 2016-08-02: qty 10

## 2016-08-02 MED ORDER — DIPHENHYDRAMINE HCL 25 MG PO CAPS
25.0000 mg | ORAL_CAPSULE | Freq: Four times a day (QID) | ORAL | Status: DC | PRN
  Administered 2016-08-02 – 2016-08-05 (×2): 25 mg via ORAL
  Filled 2016-08-02 (×2): qty 1

## 2016-08-02 MED ORDER — IBUPROFEN 800 MG PO TABS
800.0000 mg | ORAL_TABLET | Freq: Once | ORAL | Status: AC
  Administered 2016-08-02: 800 mg via ORAL
  Filled 2016-08-02: qty 1

## 2016-08-02 NOTE — Consult Note (Signed)
[name] A. Merlene Laughter, MD     [email or web address]          [name] [name] is an 80 y.o. female.   ASSESSMENT/PLAN: 1. Likely tiny subacute infarct involving the pontine region on the left side. Risk factors hypertension, diabetes, previous infarcts, dyslipidemia, obesity and age. The infarct is lacunar type infarct and I would recommend continuing with antiplatelet agent aspirin. Continue with risk factor modification.  2. Recurrent UTI with some evidence of confusion likely due to the UTI.  3. Increased risk of risk cognitive impairment due to recurrent ischemic events and the significant increased white matter disease seen on imaging. Additional labs will be obtained such as thyroid function test and vitamin B12 level.    The patient is 80 year old white female who has had recurrent hospitalizations for focal neurological symptoms lately left-sided weakness and confusion mostly due to urinary tract infection. The patient presented to the hospital with acute onset of right facial numbness. She reports she has had the symptoms for about a day. The patient denies symptoms involving the right leg or right upper extremity. She denies dysarthria or dysphasia. Appears that she may have had some confusion which initially presented. Workup has been significant for a tiny infarct seen on imaging. She also has a UTI. The patient has had an infarct in the past. This is a distinct infarct at least once but she's had recurrent left-sided symptoms in the setting of UTI with negative imaging for repeat infarct. The patient is on aspirin to full dose since she reports being compliant with this. Patient denies any symptoms of the left upper extremity or left leg. She does have episodic chest pain but not during this hospitalization a before presented to the hospital. She denies any loss of consciousness or alteration of consciousness. Review of systems is essentially unrevealing other than as  stated above.   GENERAL: This a pleasant obese female in no acute distress.  HEENT: Supple. Atraumatic normocephalic.   ABDOMEN: soft  EXTREMITIES: No edema. There is significant arthritic changes of the knees bilaterally.   BACK: Normal.  SKIN: Normal by inspection.    MENTAL STATUS: Alert and oriented. She states her age and the month appropriately. No evidence of neglect. Speech, language and cognition are generally intact. Judgment and insight normal.   CRANIAL NERVES: Pupils are equal, round and reactive to light and accommodation; extra ocular movements are full, there is no significant nystagmus; visual fields are full; upper and lower facial muscles are normal in strength and symmetric, there is no flattening of the nasolabial folds; tongue is midline; uvula is midline; shoulder elevation is normal.  MOTOR: The patient has 4+/5 strength throughout. There is a mild drift of the right upper extremity, right lower extremity and left lower extremity. No drift of the left upper extremity. Bulk and tone are normal throughout.  COORDINATION: Left finger to nose is normal, right finger to nose is normal, No rest tremor; no intention tremor; no postural tremor; no bradykinesia.  REFLEXES: Deep tendon reflexes are symmetrical and normal. Babinski reflexes are flexor bilaterally.   SENSATION: Normal to light touch. No extinction to double simultaneous tactile of visual stimulation.   Blood pressure (!) 114/40, pulse 69, temperature 98.5 F (36.9 C), temperature source Oral, resp. rate 18, height '5\' 4"'$  (1.626 m), weight 205 lb (93 kg), SpO2 98 %.  Past Medical History:  Diagnosis Date  . Arteriosclerotic cardiovascular disease (ASCVD) CARDIOLOGIST-   DR Lattie Haw    CABG  surgery in 2001; nl stress nuclear-2007; angiography in 01/2006- TO of LAD; 70% ostial stenosis      of a small ramus; patent grafts; normal EF; cath in 12/2009 essentially unchanged; EF-66%;  Congestive heart failure with  preserved LV systolic function  . Arthritis   . Chronic obstructive pulmonary disease (HCC)    Chronic bronchitis  . DDD (degenerative disc disease), lumbar   . Diastolic CHF (HCC)   . Hematuria   . History of CVA (cerebrovascular accident)    2013--  LACUNAR INFARCTIONS RIGHT THALAMUS---  residual left side of  lip numb  and left eye vision worse (which corrented lens implant from cataract extraction)  . History of MI (myocardial infarction)    07/ 2001   s/p  cabg x2  . Hyperlipidemia   . Hypertension   . Hypothyroidism   . Insulin dependent type 2 diabetes mellitus (HCC)   . Lesion of bladder   . Lower urinary tract symptoms (LUTS)   . OSA (obstructive sleep apnea)   . Peripheral neuropathy (HCC)   . Psoriasis    lower leg  . S/P CABG x 2    07-02-2000  . SUI (stress urinary incontinence, female)   . Thinning of skin   . Varicose veins     Past Surgical History:  Procedure Laterality Date  . APPENDECTOMY  age 10  . CARDIAC CATHETERIZATION  1991   No sig. cad  . CARDIAC CATHETERIZATION  07-01-2000   high grade in-stent restenosis  . CARDIAC CATHETERIZATION  04-30-2001   single native vessel cad/ graft patent  . CARDIAC CATHETERIZATION  02-13-2006  dr [place] cad with total occluded LAD  and  70% ostial of small ramus/  grafts patent/  normal ef  . CARDIAC CATHETERIZATION  01-20-2000  dr Clifton James   essentially unchanged;  severe single vessel cad with diffuse disease throughout CFX and RCA/ ef 66%;  chf with preserved LVSF  . CARDIOVASCULAR STRESS TEST  08-28-2011   DR ROTHBART   NEGATIVE NUCLEAR STUDY/  GLOBAL LVSF/  EF 65%  . CARPAL TUNNEL RELEASE Bilateral yrs ago  . CATARACT EXTRACTION W/ INTRAOCULAR LENS  IMPLANT, BILATERAL  2013  . CHOLECYSTECTOMY  1990  . COLONOSCOPY N/A 07/26/2015   Procedure: COLONOSCOPY;  Surgeon: Malissa Hippo, MD;  [facility]: [facility];  Service: Endoscopy;  Laterality: N/A;  1200  . CORONARY ANGIOPLASTY  11-13-1999   balloon  angioplasty to mLAD , d2 of LAD  . CORONARY ANGIOPLASTY WITH STENT PLACEMENT  01-17-2000   PCI stenting to mLAD & D2 of LAD  . CORONARY ARTERY BYPASS GRAFT  07-02-2000   DR Tressie Stalker   SVG to diagonal, LIMA to LAD  . CYSTOSCOPY W/ RETROGRADES Bilateral 08/09/2014   Procedure: CYSTOSCOPY, BILATERAL RETROGRADE, HYDRODISTENSION, BLADDER BIOPSY WITH FULGERATION, INSTILL PYRIDIUM AND MARCAINE ;  Surgeon: Anner Crete, MD;  Location: [facility];  Service: Urology;  Laterality: Bilateral;  . KNEE ARTHROSCOPY Left 10-18-2003   SYNOVECTOMY  . LUMBAR SPINE SURGERY  x2    yrs ago  . ORIF HUMERUS FRACTURE Right 04-18-2008  . REPAIR FLEXOR TENDON HAND Right 09-20-2008   CARPI RADIALIS TENDON TRANSFER TO EXTENSOR TO INDEX, MIDDLE, RING , LITTLE FINGERS AND DIGITI MININI  . ROTATOR CUFF REPAIR Right 1995  . TOTAL ABDOMINAL HYSTERECTOMY W/ BILATERAL SALPINGOOPHORECTOMY  age 18  . TRANSTHORACIC ECHOCARDIOGRAM  12-12-2013   MILD LVH/  EF 60-65%/  GRADE I DIASTOLIC DYSFUNCTION/  MILD  LAE    Family History  Problem Relation Age of Onset  . Hypertension Mother   . Stroke Mother   . Coronary artery disease      Multiple first and second-degree relatives  . Aneurysm      Cerebral circulation  . Colon cancer Sister   . Stroke Sister   . Coronary artery disease Sister   . Clotting disorder      Children diagnosed with hypercoagulable state  . Coronary artery disease Brother     Social History:  reports that she has never smoked. She has never used smokeless tobacco. She reports that she does not drink alcohol or use drugs.  Allergies:  Allergies  Allergen Reactions  . Codeine Itching    Medications: Prior to Admission medications   Medication Sig Start Date End Date Taking? Authorizing Provider  albuterol (PROVENTIL) (2.5 MG/3ML) 0.083% nebulizer solution Take 3 mLs (2.5 mg total) by nebulization every 4 (four) hours as needed for wheezing or shortness of breath. 04/20/16  Yes  Milton Ferguson, MD  aspirin EC 325 MG tablet Take 325 mg by mouth daily.   Yes Historical Provider, MD  Insulin Degludec (TRESIBA FLEXTOUCH) 200 UNIT/ML SOPN Inject 35-70 Units into the skin 2 (two) times daily. 35 units in the morning and 70 units in the evening   Yes Historical Provider, MD  levothyroxine (SYNTHROID, LEVOTHROID) 25 MCG tablet Take 25 mcg by mouth every morning.    Yes Historical Provider, MD  lisinopril (PRINIVIL,ZESTRIL) 10 MG tablet Take 1 tablet by mouth every morning.  11/09/13  Yes Historical Provider, MD  LORazepam (ATIVAN) 2 MG tablet Take 1 tablet by mouth at bedtime. 07/26/16  Yes Historical Provider, MD  metFORMIN (GLUCOPHAGE) 500 MG tablet Take 500 mg by mouth 2 (two) times daily with a meal.     Yes Historical Provider, MD  Multiple Vitamin (MULTIVITAMIN WITH MINERALS) TABS tablet Take 1 tablet by mouth daily.   Yes Historical Provider, MD  nitroGLYCERIN (NITROSTAT) 0.4 MG SL tablet Place 0.4 mg under the tongue every 5 (five) minutes as needed for chest pain.    Yes Historical Provider, MD  NOVOLIN 70/30 RELION (70-30) 100 UNIT/ML injection Inject 20 Units into the skin 3 (three) times daily before meals. 02/25/16  Yes Historical Provider, MD  oxyCODONE-acetaminophen (PERCOCET) 10-325 MG tablet Take 1 tablet by mouth 4 (four) times daily as needed for pain.  07/26/16  Yes Historical Provider, MD  pravastatin (PRAVACHOL) 40 MG tablet Take 20 mg by mouth at bedtime.    Yes Historical Provider, MD    Scheduled Meds: .  stroke: mapping our early stages of recovery book   Does not apply Once  . aspirin EC  325 mg Oral Daily  . enoxaparin (LOVENOX) injection  40 mg Subcutaneous Q24H  . insulin aspart  0-15 Units Subcutaneous TID WC  . insulin aspart  0-5 Units Subcutaneous QHS  . insulin aspart protamine- aspart  20 Units Subcutaneous TID AC  . levothyroxine  25 mcg Oral QAC breakfast  . LORazepam  2 mg Oral QHS  . metFORMIN  500 mg Oral BID WC  . pravastatin  40 mg Oral  QHS   Continuous Infusions:  PRN Meds:.albuterol, diphenhydrAMINE, nitroGLYCERIN, ondansetron (ZOFRAN) IV, oxyCODONE-acetaminophen **AND** oxyCODONE, senna-docusate     Results for orders placed or performed during the hospital encounter of 08/01/16 (from the past 48 hour(s))  Ethanol     Status: None   Collection Time: 08/01/16  5:15 PM  Result Value Ref Range   Alcohol, Ethyl (B) <5 <5 mg/dL    Comment:        LOWEST DETECTABLE LIMIT FOR SERUM ALCOHOL IS 5 mg/dL FOR MEDICAL PURPOSES ONLY   Protime-INR     Status: None   Collection Time: 08/01/16  5:15 PM  Result Value Ref Range   Prothrombin Time 13.8 11.4 - 15.2 seconds   INR 1.05   APTT     Status: None   Collection Time: 08/01/16  5:15 PM  Result Value Ref Range   aPTT 31 24 - 36 seconds  CBC     Status: Abnormal   Collection Time: 08/01/16  5:15 PM  Result Value Ref Range   WBC 10.9 (H) 4.0 - 10.5 K/uL   RBC 4.86 3.87 - 5.11 MIL/uL   Hemoglobin 8.6 (L) 12.0 - 15.0 g/dL   HCT 31.4 (L) 36.0 - 46.0 %   MCV 64.6 (L) 78.0 - 100.0 fL   MCH 17.7 (L) 26.0 - 34.0 pg   MCHC 27.4 (L) 30.0 - 36.0 g/dL   RDW 18.0 (H) 11.5 - 15.5 %   Platelets 338 150 - 400 K/uL  Differential     Status: Abnormal   Collection Time: 08/01/16  5:15 PM  Result Value Ref Range   Neutrophils Relative % 51 %   Lymphocytes Relative 31 %   Monocytes Relative 10 %   Eosinophils Relative 7 %   Basophils Relative 1 %   Neutro Abs 5.5 1.7 - 7.7 K/uL   Lymphs Abs 3.4 0.7 - 4.0 K/uL   Monocytes Absolute 1.1 (H) 0.1 - 1.0 K/uL   Eosinophils Absolute 0.8 (H) 0.0 - 0.7 K/uL   Basophils Absolute 0.1 0.0 - 0.1 K/uL   RBC Morphology POLYCHROMASIA PRESENT     Comment: TEARDROP CELLS ELLIPTOCYTES    WBC Morphology WHITE COUNT CONFIRMED ON SMEAR    Smear Review PLATELET COUNT CONFIRMED BY SMEAR     Comment: LARGE PLATELETS PRESENT GIANT PLATELETS SEEN   Comprehensive metabolic panel     Status: Abnormal   Collection Time: 08/01/16  5:15 PM  Result  Value Ref Range   Sodium 135 135 - 145 mmol/L   Potassium 3.7 3.5 - 5.1 mmol/L   Chloride 104 101 - 111 mmol/L   CO2 27 22 - 32 mmol/L   Glucose, Bld 158 (H) 65 - 99 mg/dL   BUN 10 6 - 20 mg/dL   Creatinine, Ser 0.63 0.44 - 1.00 mg/dL   Calcium 8.7 (L) 8.9 - 10.3 mg/dL   Total Protein 6.9 6.5 - 8.1 g/dL   Albumin 3.9 3.5 - 5.0 g/dL   AST 16 15 - 41 U/L   ALT 12 (L) 14 - 54 U/L   Alkaline Phosphatase 68 38 - 126 U/L   Total Bilirubin 0.4 0.3 - 1.2 mg/dL   GFR calc non Af Amer >60 >60 mL/min   GFR calc Af Amer >60 >60 mL/min    Comment: (NOTE) The eGFR has been calculated using the CKD EPI equation. This calculation has not been validated in all clinical situations. eGFR's persistently <60 mL/min signify possible Chronic Kidney Disease.    Anion gap 4 (L) 5 - 15  CBG monitoring, ED     Status: Abnormal   Collection Time: 08/01/16  5:16 PM  Result Value Ref Range   Glucose-Capillary 163 (H) 65 - 99 mg/dL  I-stat troponin, ED (not at [facility], [facility])     Status: None  Collection Time: 08/01/16  5:19 PM  Result Value Ref Range   Troponin i, poc 0.00 0.00 - 0.08 ng/mL   Comment 3            Comment: Due to the release kinetics of cTnI, a negative result within the first hours of the onset of symptoms does not rule out myocardial infarction with certainty. If myocardial infarction is still suspected, repeat the test at appropriate intervals.   I-Stat Chem 8, ED  (not at [facility], [facility])     Status: Abnormal   Collection Time: 08/01/16  5:22 PM  Result Value Ref Range   Sodium 140 135 - 145 mmol/L   Potassium 3.8 3.5 - 5.1 mmol/L   Chloride 101 101 - 111 mmol/L   BUN 8 6 - 20 mg/dL   Creatinine, Ser 0.70 0.44 - 1.00 mg/dL   Glucose, Bld 155 (H) 65 - 99 mg/dL   Calcium, Ion 1.21 1.12 - 1.23 mmol/L   TCO2 27 0 - 100 mmol/L   Hemoglobin 10.5 (L) 12.0 - 15.0 g/dL   HCT 31.0 (L) 36.0 - 46.0 %  Urine rapid drug screen (hosp performed)not at [facility]     Status: None   Collection Time:  08/01/16  7:25 PM  Result Value Ref Range   Opiates NONE DETECTED NONE DETECTED   Cocaine NONE DETECTED NONE DETECTED   Benzodiazepines NONE DETECTED NONE DETECTED   Amphetamines NONE DETECTED NONE DETECTED   Tetrahydrocannabinol NONE DETECTED NONE DETECTED   Barbiturates NONE DETECTED NONE DETECTED    Comment:        DRUG SCREEN FOR MEDICAL PURPOSES ONLY.  IF CONFIRMATION IS NEEDED FOR ANY PURPOSE, NOTIFY LAB WITHIN 5 DAYS.        LOWEST DETECTABLE LIMITS FOR URINE DRUG SCREEN Drug Class       Cutoff (ng/mL) Amphetamine      1000 Barbiturate      200 Benzodiazepine   884 Tricyclics       166 Opiates          300 Cocaine          300 THC              50   Urinalysis, Routine w reflex microscopic (not at [facility])     Status: Abnormal   Collection Time: 08/01/16  7:25 PM  Result Value Ref Range   Color, Urine YELLOW YELLOW   APPearance CLEAR CLEAR   Specific Gravity, Urine 1.020 1.005 - 1.030   pH 5.0 5.0 - 8.0   Glucose, UA 500 (A) NEGATIVE mg/dL   Hgb urine dipstick NEGATIVE NEGATIVE   Bilirubin Urine NEGATIVE NEGATIVE   Ketones, ur NEGATIVE NEGATIVE mg/dL   Protein, ur TRACE (A) NEGATIVE mg/dL   Nitrite POSITIVE (A) NEGATIVE   Leukocytes, UA SMALL (A) NEGATIVE  Urine microscopic-add on     Status: Abnormal   Collection Time: 08/01/16  7:25 PM  Result Value Ref Range   Squamous Epithelial / LPF 0-5 (A) NONE SEEN   WBC, UA 6-30 0 - 5 WBC/hpf   RBC / HPF NONE SEEN 0 - 5 RBC/hpf   Bacteria, UA MANY (A) NONE SEEN  Glucose, capillary     Status: None   Collection Time: 08/01/16  8:20 PM  Result Value Ref Range   Glucose-Capillary 91 65 - 99 mg/dL   Comment 1 Notify RN    Comment 2 Document in Chart   Glucose, capillary     Status: Abnormal  Collection Time: 08/01/16 11:57 PM  Result Value Ref Range   Glucose-Capillary 161 (H) 65 - 99 mg/dL   Comment 1 Notify RN    Comment 2 Document in Chart   Glucose, capillary     Status: Abnormal   Collection Time: 08/02/16   4:08 AM  Result Value Ref Range   Glucose-Capillary 196 (H) 65 - 99 mg/dL   Comment 1 Notify RN    Comment 2 Document in Chart   Lipid panel     Status: Abnormal   Collection Time: 08/02/16  6:29 AM  Result Value Ref Range   Cholesterol 162 0 - 200 mg/dL   Triglycerides 642 (H) <150 mg/dL   HDL 27 (L) >94 mg/dL   Total CHOL/HDL Ratio 6.0 RATIO   VLDL 63 (H) 0 - 40 mg/dL   LDL Cholesterol 72 0 - 99 mg/dL    Comment:        Total Cholesterol/HDL:CHD Risk Coronary Heart Disease Risk Table                     Men   Women  1/2 Average Risk   3.4   3.3  Average Risk       5.0   4.4  2 X Average Risk   9.6   7.1  3 X Average Risk  23.4   11.0        Use the calculated Patient Ratio above and the CHD Risk Table to determine the patient's CHD Risk.        ATP III CLASSIFICATION (LDL):  <100     mg/dL   Optimal  249-984  mg/dL   Near or Above                    Optimal  130-159  mg/dL   Borderline  892-449  mg/dL   High  >745     mg/dL   Very High   Glucose, capillary     Status: Abnormal   Collection Time: 08/02/16  8:06 AM  Result Value Ref Range   Glucose-Capillary 207 (H) 65 - 99 mg/dL   Comment 1 Notify RN    Comment 2 Document in Chart   Glucose, capillary     Status: Abnormal   Collection Time: 08/02/16 10:19 AM  Result Value Ref Range   Glucose-Capillary 233 (H) 65 - 99 mg/dL   Comment 1 Notify RN    Comment 2 Document in Chart   Glucose, capillary     Status: Abnormal   Collection Time: 08/02/16 12:10 PM  Result Value Ref Range   Glucose-Capillary 206 (H) 65 - 99 mg/dL   Comment 1 Notify RN    Comment 2 Document in Chart   Glucose, capillary     Status: Abnormal   Collection Time: 08/02/16  4:26 PM  Result Value Ref Range   Glucose-Capillary 174 (H) 65 - 99 mg/dL   Comment 1 Notify RN     Studies/Results:  CAROTID DOPPLERS 1. Mild (1-49%) stenosis proximal right internal carotid artery secondary to heterogenous atherosclerotic plaque. 2. Moderate  (50-69%) stenosis proximal left internal carotid artery secondary to heterogenous atherosclerotic plaque. 3. Vertebral arteries are patent with normal antegrade flow. Signed,   BRAIN MRA FINDINGS: Mild atherosclerotic changes are present within the cavernous internal carotid arteries bilaterally, left greater than right. There is a high-grade stenosis of the proximal right A1 segment, unchanged. The anterior communicating artery is patent in both A2 segments  fill from the left. The M1 segments are intact. The MCA bifurcations are within normal limits bilaterally. There is signal loss suggesting a high-grade stenosis in the proximal inferior right M2 branch. Moderate distal segmental MCA stenoses are present bilaterally, left greater than right. No other significant proximal stenosis is present.  The right vertebral artery is slightly dominant to the left. The right PICA origin is visualized and normal. The vertebrobasilar junction is normal. The basilar artery is within normal limits. The left posterior cerebral artery originates from the basilar tip. The right posterior cerebral artery is of fetal type. There is significant proximal signal loss within the right posterior cerebral artery. Moderate more distal signal loss is present in the left PCA.  IMPRESSION: 1. Stable MRA circle of Willis. 2. High-grade stenosis in the proximal right A1 segment. 3. Moderate diffuse medium and distal small vessel disease within anterior branch vessels. The left MCA is the worst. 4. Severe signal loss within right posterior cerebral artery, a fetal type PCA. 5. Moderate signal loss within more distal PCA branch vessels on the left.   BRAIN MRI FINDINGS: A subtle area of diffusion abnormality is present within the left lateral midbrain. An additional punctate area of restricted diffusion is evident within the posterior left pons. No acute cortical infarct is present.  Moderate  confluent periventricular and subcortical T2 changes bilaterally are otherwise stable. With  Internal auditory canals are within normal limits bilaterally. White matter changes extend into the brainstem. Flow is present in the major intracranial arteries.  Bilateral lens replacements are present. The globes and orbits are otherwise intact.  The paranasal sinuses are clear. There is fluid in the mastoid air cells bilaterally. No obstructing nasopharyngeal lesion is present.  IMPRESSION: 1. Two punctate areas of diffusion abnormality in the brainstem. One is in the left lateral midbrain. The others in the posterior lateral left pons. These post correspond with the patient's symptoms. 2. Otherwise stable moderate periventricular and subcortical T2 changes bilaterally with extension in the brainstem. These likely reflect the sequela of chronic microvascular ischemia. 3. Bilateral mastoid effusions. No obstructing nasopharyngeal lesion is evident.   MRA MRI reviewed in person faintly positive tiny L pontine signal DWI; Moderate PVWD and atrophy; agree with MRA findings        [name] [name] A. Merlene [name], M.D.  Diplomate, Tax adviser of Psychiatry and Neurology ( Neurology). 08/02/2016, 5:04 PM

## 2016-08-02 NOTE — Evaluation (Signed)
Speech Language Pathology Evaluation Patient Details Name: [name] [name] MRN: [id number] DOB: Apr 16, 1936 Today's Date: 08/02/2016 Time: 1191-4782 SLP Time Calculation (min) (ACUTE ONLY): 42 min  Problem List:  Patient Active Problem List   Diagnosis Date Noted  . Stroke (HCC) 08/01/2016  . Dysarthria 03/18/2016  . Influenza-like illness 11/19/2015  . Anemia of chronic disease 11/19/2015  . OSA (obstructive sleep apnea) 11/19/2015  . Left facial numbness 08/29/2015  . Gait instability 06/26/2015  . Bronchitis 11/13/2014  . COPD exacerbation (HCC) 11/13/2014  . UTI (urinary tract infection) 08/26/2014  . Near syncope 08/24/2014  . UTI (lower urinary tract infection) 12/10/2013  . Hypoxia 12/10/2013  . Weakness generalized 12/10/2013  . TIA (transient ischemic attack) 06/22/2012  . Left-sided headache 06/22/2012  . Noncompliance 06/22/2012  . Noncompliance with CPAP treatment 06/22/2012  . Psoriasis 01/07/2012  . Cerebrovascular disease   . Chronic back pain   . Chronic obstructive pulmonary disease (HCC)   . DM type 2 causing complication (HCC) 02/14/2010  . Obesity 02/14/2010  . Arteriosclerotic cardiovascular disease (ASCVD) 02/14/2010  . Hypothyroidism 02/06/2010  . Hyperlipidemia 02/06/2010  . Essential hypertension 02/06/2010  . Sleep apnea 02/06/2010   Past Medical History:  Past Medical History:  Diagnosis Date  . Arteriosclerotic cardiovascular disease (ASCVD) CARDIOLOGIST-   DR Dietrich Pates    CABG surgery in 2001; nl stress nuclear-2007; angiography in 01/2006- TO of LAD; 70% ostial stenosis      of a small ramus; patent grafts; normal EF; cath in 12/2009 essentially unchanged; EF-66%;  Congestive heart failure with preserved LV systolic function  . Arthritis   . Chronic obstructive pulmonary disease (HCC)    Chronic bronchitis  . DDD (degenerative disc disease), lumbar   . Diastolic CHF (HCC)   . Hematuria   . History of CVA (cerebrovascular accident)    2013--  LACUNAR INFARCTIONS RIGHT THALAMUS---  residual left side of  lip numb  and left eye vision worse (which corrented lens implant from cataract extraction)  . History of MI (myocardial infarction)    07/ 2001   s/p  cabg x2  . Hyperlipidemia   . Hypertension   . Hypothyroidism   . Insulin dependent type 2 diabetes mellitus (HCC)   . Lesion of bladder   . Lower urinary tract symptoms (LUTS)   . OSA (obstructive sleep apnea)   . Peripheral neuropathy (HCC)   . Psoriasis    lower leg  . S/P CABG x 2    07-02-2000  . SUI (stress urinary incontinence, female)   . Thinning of skin   . Varicose veins    Past Surgical History:  Past Surgical History:  Procedure Laterality Date  . APPENDECTOMY  age 89  . CARDIAC CATHETERIZATION  1991   No sig. cad  . CARDIAC CATHETERIZATION  07-01-2000   high grade in-stent restenosis  . CARDIAC CATHETERIZATION  04-30-2001   single native vessel cad/ graft patent  . CARDIAC CATHETERIZATION  02-13-2006  dr [place] cad with total occluded LAD  and  70% ostial of small ramus/  grafts patent/  normal ef  . CARDIAC CATHETERIZATION  01-20-2000  dr Clifton James   essentially unchanged;  severe single vessel cad with diffuse disease throughout CFX and RCA/ ef 66%;  chf with preserved LVSF  . CARDIOVASCULAR STRESS TEST  08-28-2011   DR ROTHBART   NEGATIVE NUCLEAR STUDY/  GLOBAL LVSF/  EF 65%  . CARPAL TUNNEL RELEASE Bilateral yrs ago  . CATARACT EXTRACTION  W/ INTRAOCULAR LENS  IMPLANT, BILATERAL  2013  . CHOLECYSTECTOMY  1990  . COLONOSCOPY N/A 07/26/2015   Procedure: COLONOSCOPY;  Surgeon: Malissa Hippo, MD;  Location: [facility];  Service: Endoscopy;  Laterality: N/A;  1200  . CORONARY ANGIOPLASTY  11-13-1999   balloon angioplasty to mLAD , d2 of LAD  . CORONARY ANGIOPLASTY WITH STENT PLACEMENT  01-17-2000   PCI stenting to mLAD & D2 of LAD  . CORONARY ARTERY BYPASS GRAFT  07-02-2000   DR Tressie Stalker   SVG to diagonal, LIMA to LAD  .  CYSTOSCOPY W/ RETROGRADES Bilateral 08/09/2014   Procedure: CYSTOSCOPY, BILATERAL RETROGRADE, HYDRODISTENSION, BLADDER BIOPSY WITH FULGERATION, INSTILL PYRIDIUM AND MARCAINE ;  Surgeon: Anner Crete, MD;  Location: [facility];  Service: Urology;  Laterality: Bilateral;  . KNEE ARTHROSCOPY Left 10-18-2003   SYNOVECTOMY  . LUMBAR SPINE SURGERY  x2    yrs ago  . ORIF HUMERUS FRACTURE Right 04-18-2008  . REPAIR FLEXOR TENDON HAND Right 09-20-2008   CARPI RADIALIS TENDON TRANSFER TO EXTENSOR TO INDEX, MIDDLE, RING , LITTLE FINGERS AND DIGITI MININI  . ROTATOR CUFF REPAIR Right 1995  . TOTAL ABDOMINAL HYSTERECTOMY W/ BILATERAL SALPINGOOPHORECTOMY  age 80  . TRANSTHORACIC ECHOCARDIOGRAM  12-12-2013   MILD LVH/  EF 60-65%/  GRADE I DIASTOLIC DYSFUNCTION/  MILD LAE   HPI:  [name] [name] a 80 y.o.femalewith a history of hypertension, hypothyroidism, insulin-dependent type 2 diabetes, coronary artery disease with CABG in 2001, grade 1 diastolic heart failure, hyperlipidemia, history of CVA and TIAs with no residual deficit, obstructive sleep apnea, and chronic pain. At 4 PM, the patient started having sudden onset of right facial numbness with numbness of her gums and throat with preservation of her tongue, along with heaviness of her right arm and that it is not working right. The patient went home and took 2 full dose aspirins and presented to the emergency department for evaluation. MRI of brain revealed two punctate areas of diffusion abnormality in the brainstem. One is in the left lateral midbrain. The others in the posterior lateral left pons.   Assessment / Plan / Recommendation Clinical Impression  Pt presents with moderate cognitive deficits as evidenced by the [facility] 8/30. Deficits impacted greatly by lethargy (unsure if medicine related, though nurisng reports benadryl administration 5 hours prior to SLE). Cognitive deficits characterized by reduced novel recall, reduced  thought organization, and poor sustained attention with frequent tactile cues required to maintain alertness. Right sided oral motor deficits present including reduced facial, lingual, and labial sensation and reduced right sided strength. SLP observed thin liquids via cup and straw sip with one instance of overt coughing. Educated regarding safe swallow strategies. Pt with mild dysarthria of speech secondary to acute onset of right oral motor weakness resulting in reduced articulation of sounds and reduced intelligibility. ST to follow up for  cognitive linguistic intervention and continued monitoring of swallowing function.         SLP Assessment  Patient needs continued Speech Lanaguage Pathology Services    Follow Up Recommendations  24 hour supervision/assistance    Frequency and Duration min 2x/week  1 week      SLP Evaluation Prior Functioning  Type of Home: House  Lives With: Spouse;Daughter;Son Available Help at Discharge: Family;Available 24 hours/day Education: 9th grade  Vocation: Retired   IT consultant  Overall Cognitive Status: Impaired/Different from baseline Arousal/Alertness: Lethargic Orientation Level: Disoriented to time Attention: Sustained;Focused Focused Attention: Impaired Focused Attention Impairment:  Verbal basic Sustained Attention: Impaired Sustained Attention Impairment: Verbal basic Memory: Impaired Memory Impairment: Decreased recall of new information Awareness: Impaired Executive Function: Self Monitoring;Organizing;Sequencing Sequencing: Impaired Organizing: Impaired Self Monitoring: Impaired    Comprehension  Auditory Comprehension Overall Auditory Comprehension: Appears within functional limits for tasks assessed Reading Comprehension Reading Status: Within funtional limits    Expression Expression Primary Mode of Expression: Verbal Verbal Expression Overall Verbal Expression: Appears within functional limits for tasks assessed Written  Expression Dominant Hand: Right (pt with right UE weakness)   Oral / Motor  Oral Motor/Sensory Function Overall Oral Motor/Sensory Function: Mild impairment Facial ROM: Reduced right;Suspected CN VII (facial) dysfunction Facial Sensation: Reduced right;Suspected CN V (Trigeminal) dysfunction Lingual ROM: Reduced right Lingual Strength: Reduced Lingual Sensation: Reduced;Suspected CN VII (facial) dysfunction-anterior 2/3 tongue Motor Speech Overall Motor Speech: Impaired Respiration: Impaired Level of Impairment: Word Phonation: Low vocal intensity Resonance: Within functional limits Articulation: Impaired Level of Impairment: Word Intelligibility: Intelligibility reduced Motor Planning: Witnin functional limits   GO          Functional Limitations: Motor speech Motor Speech Current Status [phone]): At least 40 percent but less than 60 percent impaired, limited or restricted Motor Speech Goal Status [phone]([id number]): At least 20 percent but less than 40 percent impaired, limited or restricted          Marcene Duoshelsea Sumney MA, CCC-SLP Acute Care Speech Language Pathologist    Kennieth RadSumney, Rilee Knoll E 08/02/2016, 2:06 PM

## 2016-08-02 NOTE — Evaluation (Signed)
Physical Therapy Evaluation Patient Details Name: [name] [name] MRN: [id number] DOB: 1936-12-17 Today's Date: 08/02/2016   History of Present Illness  80 yo F admitted 08/01/2016 due to facial numbness and R UE weakness.  At 4 PM, the patient started having sudden onset of right facial numbness with numbness of her gums and throat with preservation of her tongue, along with heaviness of her right arm and that it is not working right. The patient went home and took 2 full dose aspirins and presented to the emergency department for evaluation.  MRI shows two punctate areas of diffusion abnormality in the brainstem, one is in the L lateral midbrain.  The others in the posterior lateral L pons.  PMH: ASCVD, CABG x2 in 2001, CHF with EF of 66%, arthritis, COPD, DDD- lumbar, hematuria, CVA - 2013 lacunar infarctions of R thalamus with residual L side of lip numb and L eye vision worse, MI 2001, HTN, hypothyroidism, DM2, lesion of bladder, OSA, peripheral neuropathy, appendectomy, carpal tunnel release with repair of flexor tendon, cholecystectomy, coronary angioplasty with stent placement, lumbar spine surgery, ORIF of humerus fx, R rotator cuff repair.  Clinical Impression   Pt received in bed, no family present.  Pt was agreeable to PT evaluation, however extremely lethargic this morning.  Pt had difficulty answering PLOF questions due to falling asleep, and she required Min/Mod A for functional transfers.  She was not able to take any steps today.  I do not feel this is an accurate assessment of pt's functional mobility due to pt receiving Benadryl earlier this morning.  Unable to give accurate d/c recommendations due to this, but will continue to assess and update d/c recommendations during subsequent tx sessions.     Follow Up Recommendations Other (comment) (TBD - unable to make accurate recommendations due to lethargy during eval today, likely due to pt receiving Benadryl this morning.  Will update  recommendations with subsequent treatment sessions. )    Equipment Recommendations  Other (comment) (TBD)    Recommendations for Other Services       Precautions / Restrictions Precautions Precautions: Fall Precaution Comments: due to pt's decreased alertness today.  Restrictions Weight Bearing Restrictions: No      Mobility  Bed Mobility Overal bed mobility: Needs Assistance Bed Mobility: Supine to Sit     Supine to sit: Min assist;HOB elevated     General bed mobility comments: Requires increased time, and repetative cues to complete due to falling asleep during transfer.   Transfers Overall transfer level: Needs assistance Equipment used: Rolling walker (2 wheeled) Transfers: Sit to/from Stand Sit to Stand: Mod assist         General transfer comment: Multiple cues for hand placement  Ambulation/Gait Ambulation/Gait assistance:  (Once standing, pt unable to move feet to take any steps. )              Stairs            Wheelchair Mobility    Modified Rankin (Stroke Patients Only)       Balance Overall balance assessment: Needs assistance Sitting-balance support: Bilateral upper extremity supported;Feet supported Sitting balance-Leahy Scale: Poor Sitting balance - Comments: Required Min guard at all times.  Postural control: Posterior lean Standing balance support: Bilateral upper extremity supported Standing balance-Leahy Scale: Poor                               Pertinent Vitals/Pain Pain  Assessment: No/denies pain    Home Living Family/patient expects to be discharged to:: Private residence Living Arrangements: Other (Comment) (Pt tells me that she lives with her sons, but expressed to OT that she lives with her dtr.  and spouse. ) Available Help at Discharge: Family;Available 24 hours/day Type of Home: House Home Access: Stairs to enter   [employer] of Steps: 1 (information obtained from previous encounter  due to pt's lethargy) Home Layout: One level Home Equipment: Cane - single point;Walker - 4 wheels;Grab bars - tub/shower;Shower seat      Prior Function Level of Independence: Needs assistance   Gait / Transfers Assistance Needed: Pt uses cane occasionally for functional mobility  ADL's / Homemaking Assistance Needed: Pt reports daughter assists with dressing and bathing when needed. (information obtained from chart review as pt was unable to specify)        Hand Dominance   Dominant Hand: Right    Extremity/Trunk Assessment   Upper Extremity Assessment: RUE deficits/detail (Difficult to asses due to lethargy. ) RUE Deficits / Details: Noted Ataxia with R UE during functional mobility transfers including supine<>sit and sit<>stand.          Lower Extremity Assessment: Generalized weakness (Difficult to asses due to lethargy. )         Communication   Communication: Other (comment) (Lethargy, with pt falling asleep during evaluation. )  Cognition Arousal/Alertness: Lethargic Behavior During Therapy: Flat affect Overall Cognitive Status: Difficult to assess (due to lethargy)                      General Comments General comments (skin integrity, edema, etc.): Noted skin fibroid on L lower LE, R elbow, and bandage over R UE    Exercises        Assessment/Plan    PT Assessment Patient needs continued PT services  PT Diagnosis Generalized weakness;Abnormality of gait;Difficulty walking   PT Problem List Decreased strength;Decreased activity tolerance;Decreased balance;Decreased mobility;Decreased coordination;Decreased cognition;Obesity;Decreased skin integrity  PT Treatment Interventions Functional mobility training;Therapeutic activities;Therapeutic exercise;Balance training;Neuromuscular re-education;Gait training;DME instruction;Patient/family education   PT Goals (Current goals can be found in the Care Plan section) Acute Rehab PT Goals PT Goal  Formulation: Patient unable to participate in goal setting Time For Goal Achievement: 08/09/16 Potential to Achieve Goals: Fair    Frequency 7X/week   Barriers to discharge        Co-evaluation               End of Session Equipment Utilized During Treatment: Gait belt Activity Tolerance: Patient limited by lethargy Patient left: in bed      Functional Assessment Tool Used: [employer] "6-clicks"  Functional Limitation: Mobility: Walking and moving around Mobility: Walking and Moving Around Current Status [phone]): At least 60 percent but less than 80 percent impaired, limited or restricted Mobility: Walking and Moving Around Goal Status [phone]): At least 40 percent but less than 60 percent impaired, limited or restricted    Time: 1109-1120 PT Time Calculation (min) (ACUTE ONLY): 11 min   Charges:   PT Evaluation $PT Eval Moderate Complexity: 1 Procedure     PT G Codes:   PT G-Codes **NOT FOR INPATIENT CLASS** Functional Assessment Tool Used: [employer] "6-clicks"  Functional Limitation: Mobility: Walking and moving around Mobility: Walking and Moving Around Current Status [phone]): At least 60 percent but less than 80 percent impaired, limited or restricted Mobility: Walking and Moving Around Goal Status [phone]): At least  40 percent but less than 60 percent impaired, limited or restricted    Beth Alec Mcphee, PT, DPT X: [phone]

## 2016-08-02 NOTE — Care Management Note (Signed)
Case Management Note  Patient Details  Name: [name] [name] MRN: [id number] Date of Birth: [birth year]  Subjective/Objective: Patient is from, her daughter lives with her. She has a cane. She has PCP and insurance, reports no issues.                    Action/Plan: Anticipate DC home with home health.  Of[facility]. [facility] and will obtain [facility] from chart. [facility] first [facility]. Will follow for need of HHPT .   Expected Discharge Date:                  Expected Discharge Plan:  Home w Home Health Services  In-House Referral:  NA  Discharge planning Services  CM Consult  Post Acute Care Choice:  Home Health Choice offered to:  Patient  DME Arranged:    DME Agency:     HH Arranged:  RN, Nurse's Aide HH Agency:  [facility]  Status of Service:  In process, will continue to follow  If discussed at Long Length of Stay Meetings, dates discussed:    Additional Comments:  Jackob Crookston, Chrystine OilerSharley Diane, RN 08/02/2016, 9:29 AM

## 2016-08-02 NOTE — Progress Notes (Signed)
Patient refusing CPAP, no unit in room at this time. 

## 2016-08-02 NOTE — Care Management Obs Status (Signed)
MEDICARE OBSERVATION STATUS NOTIFICATION   Patient Details  Name: [name] [name] MRN: [id number] Date of Birth: [birth year]   Medicare Observation Status Notification Given:  Yes    [name] [name], Chrystine OilerSharley Diane, RN 08/02/2016, 11:59 AM

## 2016-08-02 NOTE — Evaluation (Signed)
Occupational Therapy Evaluation Patient Details Name: [name] AuerRuby J [name] MRN: [id number] DOB: 14-Jun-1936 Today's Date: 08/02/2016    History of Present Illness [name] [name] is a 80 y.o. female with a history of hypertension, hypothyroidism, insulin-dependent type 2 diabetes, coronary artery disease with CABG in 2001, grade 1 diastolic heart failure, hyperlipidemia, history of CVA and TIAs with no residual deficit, obstructive sleep apnea, and chronic pain.  At 4 PM, the patient started having sudden onset of right facial numbness with numbness of her gums and throat with preservation of her tongue, along with heaviness of her right arm and that it is not working right. The patient went home and took 2 full dose aspirins and presented to the emergency department for evaluation. No palliating or provoking factors. No improvement in symptoms since arrival.   Clinical Impression   Pt awake, alert, oriented x4 this am, agreeable to OT evaluation. Pt drowsy/lethargic during session, nsg reports pt recently received Benedryl which may be affecting her ability to remain alert. Nsg tech arrived during evaluation to assist pt with gown change and sponge bath. Pt requires assistance for bathing and dressing at home, today min guard required for tasks. Pt with 2 LOB during standing tasks, however pt with eyes closed during LOB episodes. OT cued pt to open eyes, pt reports this is "difficult to do." BUE strength is 4-/5, which appears to be at baseline, slight difficulty with RUE coordination testing, however pt able to utilize RUE as dominant during ADL tasks with no difficulty during evaluation. Pt appears to be at baseline functioning with ADL tasks, has supervision/assistance 24/7 at home. No further OT services required at this time.     Follow Up Recommendations  No OT follow up;Supervision/Assistance - 24 hour    Equipment Recommendations  None recommended by OT       Precautions / Restrictions  Precautions Precautions: Fall Restrictions Weight Bearing Restrictions: No      Mobility Bed Mobility Overal bed mobility: Modified Independent                Transfers Overall transfer level: Needs assistance Equipment used: None Transfers: Sit to/from Stand Sit to Stand: Supervision                   ADL Overall ADL's : Needs assistance/impaired Eating/Feeding: Set up;Bed level   Grooming: Wash/dry face;Applying deodorant;Set up;Standing   Upper Body Bathing: Moderate assistance;Sitting Upper Body Bathing Details (indicate cue type and reason): Pt requires assistance for bathing back Lower Body Bathing: Moderate assistance;Sit to/from stand Lower Body Bathing Details (indicate cue type and reason): Pt requires assistance for bathing lower legs and backs of legs, as well as for peri hygiene Upper Body Dressing : Moderate assistance;Sitting Upper Body Dressing Details (indicate cue type and reason): Pt requires assistance for hospital gown snaps and ties Lower Body Dressing: Maximal assistance;Sitting/lateral leans Lower Body Dressing Details (indicate cue type and reason): Pt unable to reach feet, requires max assist for donning socks, wears slippers at home                     Vision Vision Assessment?: No apparent visual deficits          Pertinent Vitals/Pain Pain Assessment: No/denies pain     Hand Dominance Right   Extremity/Trunk Assessment Upper Extremity Assessment Upper Extremity Assessment: Generalized weakness   Lower Extremity Assessment Lower Extremity Assessment: Defer to PT evaluation       Communication Communication Communication:  No difficulties   Cognition Arousal/Alertness: Lethargic Behavior During Therapy: Flat affect Overall Cognitive Status: Within Functional Limits for tasks assessed                                Home Living Family/patient expects to be discharged to:: Private residence Living  Arrangements: Spouse/significant other;Children (daughter) Available Help at Discharge: Family;Available 24 hours/day Type of Home: House             Bathroom Shower/Tub: Chief Strategy Officer: Standard     Home Equipment: Cane - single point;Walker - 4 wheels;Grab bars - tub/shower;Shower seat          Prior Functioning/Environment Level of Independence: Needs assistance  Gait / Transfers Assistance Needed: Pt uses cane occasionally for functional mobility ADL's / Homemaking Assistance Needed: Pt reports daughter assists with dressing and bathing when needed.         OT Diagnosis: Generalized weakness   OT Problem List: Decreased strength;Decreased activity tolerance;Impaired balance (sitting and/or standing)    End of Session    Activity Tolerance: Patient tolerated treatment well Patient left: in bed;with call bell/phone within reach;with bed alarm set   Time: [id number] OT Time Calculation (min): 23 min Charges:  OT General Charges $OT Visit: 1 Procedure OT Evaluation $OT Eval Low Complexity: 1 Procedure G-Codes: OT G-codes **NOT FOR INPATIENT CLASS** Functional Assessment Tool Used: clinical judgement Functional Limitation: Self care Self Care Current Status (O1308): At least 40 percent but less than 60 percent impaired, limited or restricted Self Care Goal Status (M5784): At least 40 percent but less than 60 percent impaired, limited or restricted Self Care Discharge Status [phone]): At least 40 percent but less than 60 percent impaired, limited or restricted    [name] [name], OTR/L  [phone] 08/02/2016, 8:56 AM

## 2016-08-02 NOTE — Progress Notes (Signed)
Pt c/o increased facial numbness and headache.  Systolic BP also increased during q2hr vitals.  Md informed and new order given.

## 2016-08-02 NOTE — Progress Notes (Signed)
Patient c/o nausea this am and did not want breakfast. Pt did not want sliding scale insulin coverage with meals. Discussed insulin 70/30 with patient as well. Pt agreeable to take that as ordered. Discussed with Dr Janna ArchonDiego on rounds. Pt had not eaten much lunch, order received to give Insulin 70/30 10 units SQ x 1 with lunch and again with supper if unable to eat most of meal. ST eval completed at lunchtime, reported pt had episode of coughing after swallowing. Requested bedside RN stroke swallow screen. Screen completed, pt tolerated well and passed. MD aware. Nursing to continue to monitor. Earnstine RegalAshley Corneshia Hines, RN

## 2016-08-02 NOTE — Progress Notes (Signed)
Patient presented with fluctuating possible neurologic symptoms of dysphagia.  she has  right trigeminal area dysesthesias or paresthesias, she complains of some dizziness and is alert and oriented in 3 spheres MRA/MRI done today reveals 2 punctate areas of diffusion abnormality in the brainstem 1 in the left lateral midbrain the other in the left lateral pons. MRA reveals stable circle of Willis. A high-grade stenosis in the proximal A1 segment of right the anterior communicating artery is patent but fills from the left MCA bifurcations are within normal limits bilaterally. Severe signal loss of right PCA with moderate signal loss of the distal PCA on the left. Right carotid artery reveals stenosis less than 50% right vertebral artery is patent left carotid artery with possible stenosis in the 50-69% range Merian Wroe [name] ONG:[id number] DOB: Feb 26, 1936 DOA: 08/01/2016 PCP: [name], MD   Physical Exam: Blood pressure (!) 118/52, pulse 65, temperature 98.6 F (37 C), resp. rate 16, height 5\' 4"  (1.626 m), weight 93 kg (205 lb), SpO2 92 %. Patient is alert and oriented in 3 spheres. There is mild left flattening of left nasolabial full patient reported right sided trigeminal distribution diminished sensation according to speech pathology evaluation of the cranial nerves remain intact sensorimotor 5 out of 5 all extremities plantars downgoing lungs clear to A&P no rales wheeze rhonchi heart regular rhythm no S3-S4 no heaves thrills rubs   Investigations:  No results found for this or any previous visit (from the past 240 hour(s)).   Basic Metabolic Panel:  Recent Labs  44/01/02 1715 08/01/16 1722  NA 135 140  K 3.7 3.8  CL 104 101  CO2 27  --   GLUCOSE 158* 155*  BUN 10 8  CREATININE 0.63 0.70  CALCIUM 8.7*  --    Liver Function Tests:  Recent Labs  08/01/16 1715  AST 16  ALT 12*  ALKPHOS 68  BILITOT 0.4  PROT 6.9  ALBUMIN 3.9     CBC:  Recent Labs  08/01/16 1715  08/01/16 1722  WBC 10.9*  --   NEUTROABS 5.5  --   HGB 8.6* 10.5*  HCT 31.4* 31.0*  MCV 64.6*  --   PLT 338  --     Dg Chest 2 View  Result Date: 08/01/2016 CLINICAL DATA:  Possible stroke. EXAM: CHEST  2 VIEW COMPARISON:  04/21/2016 FINDINGS: Postsurgical changes from median sternotomy and CABG are stable. The cardiac silhouette is mildly enlarged. Mediastinal contours appear intact. There is no evidence of focal airspace consolidation, pleural effusion or pneumothorax. Left upper lobe atelectasis is noted. Osseous structures are without acute abnormality. Soft tissues are grossly normal. IMPRESSION: Cardiomegaly without evidence of pulmonary edema. Left upper lobe atelectasis. Electronically Signed   By: Ted Mcalpine M.D.   On: 08/01/2016 20:27   Mr [name] [name] [name] Contrast  Result Date: 08/02/2016 CLINICAL DATA:  New onset of right facial numbness. Abnormal feeling in the right upper extremity. EXAM: MRA [name] WITHOUT CONTRAST TECHNIQUE: Angiographic images of the Circle of Willis were obtained using MRA technique without intravenous contrast. COMPARISON:  MRI [name] from the same day. MRA circle of Willis 03/18/2016. FINDINGS: Mild atherosclerotic changes are present within the cavernous internal carotid arteries bilaterally, left greater than right. There is a high-grade stenosis of the proximal right A1 segment, unchanged. The anterior communicating artery is patent in both A2 segments fill from the left. The M1 segments are intact. The MCA bifurcations are within normal limits bilaterally. There is signal loss suggesting a  high-grade stenosis in the proximal inferior right M2 branch. Moderate distal segmental MCA stenoses are present bilaterally, left greater than right. No other significant proximal stenosis is present. The right vertebral artery is slightly dominant to the left. The right PICA origin is visualized and normal. The vertebrobasilar junction is normal. The basilar artery is  within normal limits. The left posterior cerebral artery originates from the basilar tip. The right posterior cerebral artery is of fetal type. There is significant proximal signal loss within the right posterior cerebral artery. Moderate more distal signal loss is present in the left PCA. IMPRESSION: 1. Stable MRA circle of Willis. 2. High-grade stenosis in the proximal right A1 segment. 3. Moderate diffuse medium and distal small vessel disease within anterior branch vessels. The left MCA is the worst. 4. Severe signal loss within right posterior cerebral artery, a fetal type PCA. 5. Moderate signal loss within more distal PCA branch vessels on the left. Electronically Signed   By: Marin Roberts M.D.   On: 08/02/2016 08:00   Mr [name] [name] Contrast  Result Date: 08/02/2016 CLINICAL DATA:  Right facial numbness and abnormal feeling in the right upper extremity for 1 day. Stroke. EXAM: MRI [name] WITHOUT CONTRAST TECHNIQUE: Multiplanar, multiecho pulse sequences of the [name] and surrounding structures were obtained without intravenous contrast. COMPARISON:  CT [name] without contrast 08/01/2016 MRI [name] 04/04/2016. FINDINGS: A subtle area of diffusion abnormality is present within the left lateral midbrain. An additional punctate area of restricted diffusion is evident within the posterior left pons. No acute cortical infarct is present. Moderate confluent periventricular and subcortical T2 changes bilaterally are otherwise stable. With Internal auditory canals are within normal limits bilaterally. White matter changes extend into the brainstem. Flow is present in the major intracranial arteries. Bilateral lens replacements are present. The globes and orbits are otherwise intact. The paranasal sinuses are clear. There is fluid in the mastoid air cells bilaterally. No obstructing nasopharyngeal lesion is present. IMPRESSION: 1. Two punctate areas of diffusion abnormality in the brainstem. One is in the left  lateral midbrain. The others in the posterior lateral left pons. These post correspond with the patient's symptoms. 2. Otherwise stable moderate periventricular and subcortical T2 changes bilaterally with extension in the brainstem. These likely reflect the sequela of chronic microvascular ischemia. 3. Bilateral mastoid effusions. No obstructing nasopharyngeal lesion is evident. Electronically Signed   By: Marin Roberts M.D.   On: 08/02/2016 07:56   US Carotid Bilateral (at [facility] Only)  Result Date: 08/02/2016 CLINICAL DATA:  80 year old female with known cardiovascular disease and sudden onset right facial and upper extremity numbness EXAM: BILATERAL CAROTID DUPLEX ULTRASOUND TECHNIQUE: Wallace Cullens scale imaging, color Doppler and duplex ultrasound were performed of bilateral carotid and vertebral arteries in the neck. COMPARISON:  Prior duplex carotid ultrasound 03/19/2016 FINDINGS: Criteria: Quantification of carotid stenosis is based on velocity parameters that correlate the residual internal carotid diameter with NASCET-based stenosis levels, using the diameter of the distal internal carotid lumen as the denominator for stenosis measurement. The following velocity measurements were obtained: RIGHT ICA:  89/17 cm/sec CCA:  127/18 cm/sec SYSTOLIC ICA/CCA RATIO:  0.7 DIASTOLIC ICA/CCA RATIO:  0.8 ECA:  112 cm/sec LEFT ICA:  140/42 cm/sec CCA:  106/26 cm/sec SYSTOLIC ICA/CCA RATIO:  1.3 DIASTOLIC ICA/CCA RATIO:  2.4 ECA:  119 cm/sec RIGHT CAROTID ARTERY: Intimal medial thickening. Mild smooth heterogeneous atherosclerotic plaque in the proximal internal carotid artery. By peak systolic velocity criteria the estimated stenosis remains less than 50%.  RIGHT VERTEBRAL ARTERY:  Patent with antegrade flow. LEFT CAROTID ARTERY: Heterogeneous atherosclerotic plaque in the proximal internal carotid artery. By peak systolic velocity criteria the estimated stenosis falls in the 50- 69% range. LEFT VERTEBRAL ARTERY:   Patent with antegrade flow. IMPRESSION: 1. Mild (1-49%) stenosis proximal right internal carotid artery secondary to heterogenous atherosclerotic plaque. 2. Moderate (50-69%) stenosis proximal left internal carotid artery secondary to heterogenous atherosclerotic plaque. 3. Vertebral arteries are patent with normal antegrade flow. Signed, Sterling BigHeath K. McCullough, MD [facility] Electronically Signed   By: Malachy MoanHeath  McCullough M.D.   On: 08/02/2016 08:33   Ct [name] Code Stroke W/o Cm  Result Date: 08/01/2016 CLINICAL DATA:  Code stroke. Right-sided facial numbness and tingling down the right side of the throat. Right arm weakness. EXAM: CT [name] WITHOUT CONTRAST TECHNIQUE: Contiguous axial images were obtained from the base of the skull through the vertex without intravenous contrast. COMPARISON:  [name] CT and MRI 04/04/2016 FINDINGS: The study is mildly motion degraded without evidence of acute intracranial hemorrhage are acute large territory infarct within this limitation. No mass, midline shift, or extra-axial fluid collection is seen. Cerebral atrophy is unchanged. Cerebral white matter hypodensities are unchanged and nonspecific but compatible with moderate chronic small vessel ischemic disease. Prior bilateral cataract extraction is noted. There is opacification of a posterior right ethmoid air cell. Chronic inferior left mastoid air cell opacification is unchanged. Calcified atherosclerosis is noted at the skullbase. No acute osseous abnormality is identified. ASPECTS [facility]([place] Stroke Program Early CT Score, [email or web address]) - Ganglionic level infarction (caudate, lentiform nuclei, internal capsule, insula, M1-M3 cortex): 7 - Supraganglionic infarction (M4-M6 cortex): 3 Total score (0-10 with 10 being normal): 10 IMPRESSION: 1. Mild motion degradation without evidence of acute intracranial abnormality. 2. ASPECTS score 10. 3. Moderate chronic  small vessel ischemic disease. These results were called by telephone at the time of interpretation on 08/01/2016 at 5:25 pm to Dr. Eber HongBRIAN MILLER , who verbally acknowledged these results. Electronically Signed   By: Sebastian AcheAllen  Grady M.D.   On: 08/01/2016 17:26      Medications:   Impression:  Principal Problem:   Stroke [facility](HCC) Active Problems:   Hypothyroidism   DM type 2 causing complication (HCC)   Essential hypertension   OSA (obstructive sleep apnea)     Plan: Await neurologic evaluation this afternoon evening continue aspirin continue DVT prophylaxis observe neurologic symptoms to see if they correlate with the defined anatomy achieved good glycemic control with around-the-clock sliding scale NovoLog insulin. Reduce 7030 insulin to half dosage if she is not eating  Consultants: Neurology requested   Procedures echocardiogram carotid duplex scan MRI MRA of [name] CT scan of [name]   Antibiotics:           Time spent: One hour   LOS: 0 days   Lexus Shampine M   08/02/2016, 1:56 PM

## 2016-08-02 NOTE — Progress Notes (Signed)
*  PRELIMINARY RESULTS* Echocardiogram 2D Echocardiogram has been performed.  Stacey DrainWhite, Swara Donze J 08/02/2016, 3:44 PM

## 2016-08-03 LAB — BASIC METABOLIC PANEL
ANION GAP: 7 (ref 5–15)
BUN: 10 mg/dL (ref 6–20)
CALCIUM: 8.7 mg/dL — AB (ref 8.9–10.3)
CO2: 27 mmol/L (ref 22–32)
Chloride: 101 mmol/L (ref 101–111)
Creatinine, Ser: 0.69 mg/dL (ref 0.44–1.00)
Glucose, Bld: 195 mg/dL — ABNORMAL HIGH (ref 65–99)
Potassium: 4 mmol/L (ref 3.5–5.1)
SODIUM: 135 mmol/L (ref 135–145)

## 2016-08-03 LAB — HEMOGLOBIN A1C
HEMOGLOBIN A1C: 8.5 % — AB (ref 4.8–5.6)
MEAN PLASMA GLUCOSE: 197 mg/dL

## 2016-08-03 LAB — GLUCOSE, CAPILLARY
GLUCOSE-CAPILLARY: 214 mg/dL — AB (ref 65–99)
GLUCOSE-CAPILLARY: 178 mg/dL — AB (ref 65–99)
Glucose-Capillary: 199 mg/dL — ABNORMAL HIGH (ref 65–99)
Glucose-Capillary: 142 mg/dL — ABNORMAL HIGH (ref 65–99)

## 2016-08-03 NOTE — Progress Notes (Signed)
Physical Therapy Treatment Patient Details Name: [name] [name] MRN: [id number] DOB: [birth year] Today's Date: 08/03/2016    History of Present Illness 80 yo F admitted 08/01/2016 due to facial numbness and R UE weakness.  At 4 PM, the patient started having sudden onset of right facial numbness with numbness of her gums and throat with preservation of her tongue, along with heaviness of her right arm and that it is not working right. The patient went home and took 2 full dose aspirins and presented to the emergency department for evaluation.  MRI shows two punctate areas of diffusion abnormality in the brainstem, one is in the L lateral midbrain.  The others in the posterior lateral L pons.  PMH: ASCVD, CABG x2 in 2001, CHF with EF of 66%, arthritis, COPD, DDD- lumbar, hematuria, CVA - 2013 lacunar infarctions of R thalamus with residual L side of lip numb and L eye vision worse, MI 2001, HTN, hypothyroidism, DM2, lesion of bladder, OSA, peripheral neuropathy, appendectomy, carpal tunnel release with repair of flexor tendon, cholecystectomy, coronary angioplasty with stent placement, lumbar spine surgery, ORIF of humerus fx, R rotator cuff repair.    PT Comments    Arrived in patient's room noting that she has her feet hanging off the side of the bed/was trying to get up; performed supine to sit with Mod(A), patient then able to maintain sitting at EOB with supervision this session. Performed sit to stand/stand to sit with min guard but poor form and safety despite cues from PT. Gait in room approximately 97f, then patient report of fatigue and increased depth of respiration, however upon return to sitting O2/HR both WNL on 3LPM O2. Patient very unsafe with gait, often pushing walking way away, or letting go of walker and becoming very unsteady. Patient seems limited by confusion at this time, as she often stated "I don't know what to do", or "I don't know what I'm doing right now" and required Mod-Max  verbal and tactile cues from PT. Static standing at side of bed with walker while CNA cleaned patient today, with cues for safety with walker; sit to supine with Min(A). Patient left supine in bed with all needs met and bed alarm activated; RN educated regarding mobility status and PT concern over confusion, also that patient should physically be able [facility]  But she will need close guarding for safety. At this time patient will definitely need 24 hour supervision due to confusion; TBD next session on 8/13 if she is more appropriate for HHPT or SNF (more assessment needed by this PT for clear decision making).    Follow Up Recommendations  Other (comment) ( 24 hour supervision and HHPT OR SNF; TBD)     Equipment Recommendations  Other (comment);Standard walker (would need walker for safety )    Recommendations for Other Services       Precautions / Restrictions Precautions Precautions: Fall Precaution Comments: due to confusion  Restrictions Weight Bearing Restrictions: No    Mobility  Bed Mobility Overal bed mobility: Needs Assistance Bed Mobility: Supine to Sit;Sit to Supine     Supine to sit: Mod assist Sit to supine: Min assist   General bed mobility comments: cues secondary to confusion   Transfers Overall transfer level: Needs assistance Equipment used: Rolling walker (2 wheeled) Transfers: Sit to/from Stand Sit to Stand: Min guard         General transfer comment: cues for hand placement and safety   Ambulation/Gait Ambulation/Gait  assistance: Min Wellsite geologist (Feet): 10 Feet Assistive device: Rolling walker (2 wheeled) Gait Pattern/deviations: Step-through pattern;Drifts right/left;Trunk flexed;Wide base of support   Gait velocity interpretation: <1.8 ft/sec, indicative of risk for recurrent falls General Gait Details: very confused, required multiple cues for safety with assistive device as well as navigation in  room    Stairs            Wheelchair Mobility    Modified Rankin (Stroke Patients Only)       Balance Overall balance assessment: Needs assistance Sitting-balance support: Bilateral upper extremity supported;Feet supported Sitting balance-Leahy Scale: Fair Sitting balance - Comments: supervision required at all times    Standing balance support: Bilateral upper extremity supported Standing balance-Leahy Scale: Fair Standing balance comment: able to maintain balance with RW, however tends to push it away and very unsteady when hands off of device                     Cognition Arousal/Alertness: Awake/alert Behavior During Therapy: Flat affect;Impulsive Overall Cognitive Status: Impaired/Different from baseline Area of Impairment: Orientation;Attention;Following commands;Safety/judgement;Problem solving Orientation Level: Place;Person Current Attention Level: Alternating   Following Commands: Follows one step commands inconsistently;Follows one step commands with increased time Safety/Judgement: Decreased awareness of safety;Decreased awareness of deficits   Problem Solving: Slow processing;Requires verbal cues;Requires tactile cues      Exercises      General Comments General comments (skin integrity, edema, etc.): session limited by confusion in general today       Pertinent Vitals/Pain Pain Assessment: No/denies pain    Home Living Family/patient expects to be discharged to:: Private residence                    Prior Function            PT Goals (current goals can now be found in the care plan section) Acute Rehab PT Goals PT Goal Formulation: Patient unable to participate in goal setting Time For Goal Achievement: 08/09/16 Potential to Achieve Goals: Fair Progress towards PT goals: Progressing toward goals;PT to reassess next treatment    Frequency  7X/week    PT Plan Discharge plan needs to be updated    Co-evaluation              End of Session Equipment Utilized During Treatment: Gait belt Activity Tolerance: Patient limited by fatigue;Patient tolerated treatment well;No increased pain Patient left: in bed;with call bell/phone within reach;with bed alarm set     Time: 1005-1028 PT Time Calculation (min) (ACUTE ONLY): 23 min  Charges:  $Gait Training: 8-22 mins $Therapeutic Activity: 8-22 mins                    G Codes:      Deniece Ree PT, DPT [phone]

## 2016-08-03 NOTE — Progress Notes (Signed)
Patient alert and oriented will place her on out of bed to chair 3 times a day today no neurologic note on chart focally she is neurologically intact A do not see evidence of a hard CVA with soft fluctuating son yesterday of left nasal labial fold flattening which is not present today right trigeminal paresthesias some difficulty swallowing which is chronic sensory motor in the extremities was 5 out of 5 bilaterally plantars were downgoing she has a week history of a stroke not well documented and we'll continue to observe this patient on aspirin as she has multiple circulatory impairments in the carotid posterior cerebral artery and circle of Willis formations we'll await neurologic input patient is on DVT prophylaxis and full dose aspirin [name] [name] [name] [name]:[id number] DOB: 11-08-1936 DOA: 08/01/2016 PCP: [name], MD   Physical Exam: Blood pressure (!) 114/43, pulse 97, temperature 98.5 F (36.9 C), temperature source Oral, resp. rate 20, height 5\' 4"  (1.626 m), weight 93 kg (205 lb), SpO2 98 %. Lungs diminished breath sounds in bases no rales wheeze rhonchi appreciable heart regular rhythm no S3-S4 no heaves thrills rubs abdomen obese soft nontender bowel sounds normoactive and she Has above mentioned no significant change perhaps some improvement in right trigeminal dysesthesias today compared to yesterday   Investigations:  No results found for this or any previous visit (from the past 240 hour(s)).   Basic Metabolic Panel:  Recent Labs  81/19/14 1715 08/01/16 1722 08/03/16 0601  NA 135 140 135  K 3.7 3.8 4.0  CL 104 101 101  CO2 27  --  27  GLUCOSE 158* 155* 195*  BUN 10 8 10   CREATININE 0.63 0.70 0.69  CALCIUM 8.7*  --  8.7*   Liver Function Tests:  Recent Labs  08/01/16 1715  AST 16  ALT 12*  ALKPHOS 68  BILITOT 0.4  PROT 6.9  ALBUMIN 3.9     CBC:  Recent Labs  08/01/16 1715 08/01/16 1722  WBC 10.9*  --   NEUTROABS 5.5  --   HGB 8.6* 10.5*  HCT  31.4* 31.0*  MCV 64.6*  --   PLT 338  --     Dg Chest 2 View  Result Date: 08/01/2016 CLINICAL DATA:  Possible stroke. EXAM: CHEST  2 VIEW COMPARISON:  2020-11-316 FINDINGS: Postsurgical changes from median sternotomy and CABG are stable. The cardiac silhouette is mildly enlarged. Mediastinal contours appear intact. There is no evidence of focal airspace consolidation, pleural effusion or pneumothorax. Left upper lobe atelectasis is noted. Osseous structures are without acute abnormality. Soft tissues are grossly normal. IMPRESSION: Cardiomegaly without evidence of pulmonary edema. Left upper lobe atelectasis. Electronically Signed   By: Ted Mcalpine M.D.   On: 08/01/2016 20:27   [name] Wo Contrast  Result Date: 08/02/2016 CLINICAL DATA:  New onset of right facial numbness. Abnormal feeling in the right upper extremity. EXAM: MRA HEAD WITHOUT CONTRAST TECHNIQUE: Angiographic images of the Circle of Willis were obtained using MRA technique without intravenous contrast. COMPARISON:  MRI brain from the same day. MRA circle of Willis 03/18/2016. FINDINGS: Mild atherosclerotic changes are present within the cavernous internal carotid arteries bilaterally, left greater than right. There is a high-grade stenosis of the proximal right A1 segment, unchanged. The anterior communicating artery is patent in both A2 segments fill from the left. The M1 segments are intact. The MCA bifurcations are within normal limits bilaterally. There is signal loss suggesting a high-grade stenosis in the proximal inferior right M2 branch.  Moderate distal segmental MCA stenoses are present bilaterally, left greater than right. No other significant proximal stenosis is present. The right vertebral artery is slightly dominant to the left. The right PICA origin is visualized and normal. The vertebrobasilar junction is normal. The basilar artery is within normal limits. The left posterior cerebral artery originates from the  basilar tip. The right posterior cerebral artery is of fetal type. There is significant proximal signal loss within the right posterior cerebral artery. Moderate more distal signal loss is present in the left PCA. IMPRESSION: 1. Stable MRA circle of Willis. 2. High-grade stenosis in the proximal right A1 segment. 3. Moderate diffuse medium and distal small vessel disease within anterior branch vessels. The left MCA is the worst. 4. Severe signal loss within right posterior cerebral artery, a fetal type PCA. 5. Moderate signal loss within more distal PCA branch vessels on the left. Electronically Signed   By: Marin Roberts M.D.   On: 08/02/2016 08:00   [name] Contrast  Result Date: 08/02/2016 CLINICAL DATA:  Right facial numbness and abnormal feeling in the right upper extremity for 1 day. Stroke. EXAM: MRI HEAD WITHOUT CONTRAST TECHNIQUE: Multiplanar, multiecho pulse sequences of the brain and surrounding structures were obtained without intravenous contrast. COMPARISON:  CT head without contrast 08/01/2016 MRI brain 04/04/2016. FINDINGS: A subtle area of diffusion abnormality is present within the left lateral midbrain. An additional punctate area of restricted diffusion is evident within the posterior left pons. No acute cortical infarct is present. Moderate confluent periventricular and subcortical T2 changes bilaterally are otherwise stable. With Internal auditory canals are within normal limits bilaterally. White matter changes extend into the brainstem. Flow is present in the major intracranial arteries. Bilateral lens replacements are present. The globes and orbits are otherwise intact. The paranasal sinuses are clear. There is fluid in the mastoid air cells bilaterally. No obstructing nasopharyngeal lesion is present. IMPRESSION: 1. Two punctate areas of diffusion abnormality in the brainstem. One is in the left lateral midbrain. The others in the posterior lateral left pons. These post  correspond with the patient's symptoms. 2. Otherwise stable moderate periventricular and subcortical T2 changes bilaterally with extension in the brainstem. These likely reflect the sequela of chronic microvascular ischemia. 3. Bilateral mastoid effusions. No obstructing nasopharyngeal lesion is evident. Electronically Signed   By: Marin Roberts M.D.   On: 08/02/2016 07:56   US Carotid Bilateral (at [facility] Only)  Result Date: 08/02/2016 CLINICAL DATA:  80 year old female with known cardiovascular disease and sudden onset right facial and upper extremity numbness EXAM: BILATERAL CAROTID DUPLEX ULTRASOUND TECHNIQUE: Wallace Cullens scale imaging, color Doppler and duplex ultrasound were performed of bilateral carotid and vertebral arteries in the neck. COMPARISON:  Prior duplex carotid ultrasound 03/19/2016 FINDINGS: Criteria: Quantification of carotid stenosis is based on velocity parameters that correlate the residual internal carotid diameter with NASCET-based stenosis levels, using the diameter of the distal internal carotid lumen as the denominator for stenosis measurement. The following velocity measurements were obtained: RIGHT ICA:  89/17 cm/sec CCA:  127/18 cm/sec SYSTOLIC ICA/CCA RATIO:  0.7 DIASTOLIC ICA/CCA RATIO:  0.8 ECA:  112 cm/sec LEFT ICA:  140/42 cm/sec CCA:  106/26 cm/sec SYSTOLIC ICA/CCA RATIO:  1.3 DIASTOLIC ICA/CCA RATIO:  2.4 ECA:  119 cm/sec RIGHT CAROTID ARTERY: Intimal medial thickening. Mild smooth heterogeneous atherosclerotic plaque in the proximal internal carotid artery. By peak systolic velocity criteria the estimated stenosis remains less than 50%. RIGHT VERTEBRAL ARTERY:  Patent with antegrade flow. LEFT  CAROTID ARTERY: Heterogeneous atherosclerotic plaque in the proximal internal carotid artery. By peak systolic velocity criteria the estimated stenosis falls in the 50- 69% range. LEFT VERTEBRAL ARTERY:  Patent with antegrade flow. IMPRESSION: 1. Mild (1-49%) stenosis proximal  right internal carotid artery secondary to heterogenous atherosclerotic plaque. 2. Moderate (50-69%) stenosis proximal left internal carotid artery secondary to heterogenous atherosclerotic plaque. 3. Vertebral arteries are patent with normal antegrade flow. Signed, Sterling BigHeath K. McCullough, MD [facility] Electronically Signed   By: Malachy MoanHeath  McCullough M.D.   On: 08/02/2016 08:33   Ct Head Code Stroke W/o Cm  Result Date: 08/01/2016 CLINICAL DATA:  Code stroke. Right-sided facial numbness and tingling down the right side of the throat. Right arm weakness. EXAM: CT HEAD WITHOUT CONTRAST TECHNIQUE: Contiguous axial images were obtained from the base of the skull through the vertex without intravenous contrast. COMPARISON:  Head CT and MRI 04/04/2016 FINDINGS: The study is mildly motion degraded without evidence of acute intracranial hemorrhage are acute large territory infarct within this limitation. No mass, midline shift, or extra-axial fluid collection is seen. Cerebral atrophy is unchanged. Cerebral white matter hypodensities are unchanged and nonspecific but compatible with moderate chronic small vessel ischemic disease. Prior bilateral cataract extraction is noted. There is opacification of a posterior right ethmoid air cell. Chronic inferior left mastoid air cell opacification is unchanged. Calcified atherosclerosis is noted at the skullbase. No acute osseous abnormality is identified. ASPECTS [facility]([place] Stroke Program Early CT Score, [email or web address]) - Ganglionic level infarction (caudate, lentiform nuclei, internal capsule, insula, M1-M3 cortex): 7 - Supraganglionic infarction (M4-M6 cortex): 3 Total score (0-10 with 10 being normal): 10 IMPRESSION: 1. Mild motion degradation without evidence of acute intracranial abnormality. 2. ASPECTS score 10. 3. Moderate chronic small vessel ischemic disease. These results were called by telephone at  the time of interpretation on 08/01/2016 at 5:25 pm to Dr. Eber HongBRIAN MILLER , who verbally acknowledged these results. Electronically Signed   By: Sebastian AcheAllen  Grady M.D.   On: 08/01/2016 17:26      Medications:   Impression:  Principal Problem:   Stroke [facility](HCC) Active Problems:   Hypothyroidism   DM type 2 causing complication (HCC)   Essential hypertension   OSA (obstructive sleep apnea)     Plan: Continue full dose aspirin continue DVT prophylaxis to bed to chair 3 times a day observe the diet as tolerated monitor electrolytes and hemodynamics as well as neurologic checks every 4 hours  Consultants: Neurology requested   Procedures   Antibiotics:            Time spent: 30 minutes   LOS: 0 days   Mylea Roarty M   08/03/2016, 11:43 AM

## 2016-08-04 DIAGNOSIS — J449 Chronic obstructive pulmonary disease, unspecified: Secondary | ICD-10-CM | POA: Diagnosis present

## 2016-08-04 DIAGNOSIS — G4733 Obstructive sleep apnea (adult) (pediatric): Secondary | ICD-10-CM | POA: Diagnosis present

## 2016-08-04 DIAGNOSIS — Z7982 Long term (current) use of aspirin: Secondary | ICD-10-CM | POA: Diagnosis not present

## 2016-08-04 DIAGNOSIS — Z823 Family history of stroke: Secondary | ICD-10-CM | POA: Diagnosis not present

## 2016-08-04 DIAGNOSIS — Z955 Presence of coronary angioplasty implant and graft: Secondary | ICD-10-CM | POA: Diagnosis not present

## 2016-08-04 DIAGNOSIS — N390 Urinary tract infection, site not specified: Secondary | ICD-10-CM | POA: Diagnosis present

## 2016-08-04 DIAGNOSIS — Z8249 Family history of ischemic heart disease and other diseases of the circulatory system: Secondary | ICD-10-CM | POA: Diagnosis not present

## 2016-08-04 DIAGNOSIS — Z8673 Personal history of transient ischemic attack (TIA), and cerebral infarction without residual deficits: Secondary | ICD-10-CM | POA: Diagnosis not present

## 2016-08-04 DIAGNOSIS — E039 Hypothyroidism, unspecified: Secondary | ICD-10-CM | POA: Diagnosis present

## 2016-08-04 DIAGNOSIS — I110 Hypertensive heart disease with heart failure: Secondary | ICD-10-CM | POA: Diagnosis present

## 2016-08-04 DIAGNOSIS — Z800 Family history of malignant neoplasm of digestive organs: Secondary | ICD-10-CM | POA: Diagnosis not present

## 2016-08-04 DIAGNOSIS — G8191 Hemiplegia, unspecified affecting right dominant side: Secondary | ICD-10-CM | POA: Diagnosis present

## 2016-08-04 DIAGNOSIS — Z832 Family history of diseases of the blood and blood-forming organs and certain disorders involving the immune mechanism: Secondary | ICD-10-CM | POA: Diagnosis not present

## 2016-08-04 DIAGNOSIS — Z951 Presence of aortocoronary bypass graft: Secondary | ICD-10-CM | POA: Diagnosis not present

## 2016-08-04 DIAGNOSIS — I2510 Atherosclerotic heart disease of native coronary artery without angina pectoris: Secondary | ICD-10-CM | POA: Diagnosis present

## 2016-08-04 DIAGNOSIS — Z9049 Acquired absence of other specified parts of digestive tract: Secondary | ICD-10-CM | POA: Diagnosis not present

## 2016-08-04 DIAGNOSIS — Z6835 Body mass index (BMI) 35.0-35.9, adult: Secondary | ICD-10-CM | POA: Diagnosis not present

## 2016-08-04 DIAGNOSIS — R531 Weakness: Secondary | ICD-10-CM | POA: Diagnosis present

## 2016-08-04 DIAGNOSIS — E785 Hyperlipidemia, unspecified: Secondary | ICD-10-CM | POA: Diagnosis present

## 2016-08-04 DIAGNOSIS — E669 Obesity, unspecified: Secondary | ICD-10-CM | POA: Diagnosis present

## 2016-08-04 DIAGNOSIS — E1142 Type 2 diabetes mellitus with diabetic polyneuropathy: Secondary | ICD-10-CM | POA: Diagnosis present

## 2016-08-04 DIAGNOSIS — I5032 Chronic diastolic (congestive) heart failure: Secondary | ICD-10-CM | POA: Diagnosis present

## 2016-08-04 DIAGNOSIS — Z794 Long term (current) use of insulin: Secondary | ICD-10-CM | POA: Diagnosis not present

## 2016-08-04 DIAGNOSIS — I252 Old myocardial infarction: Secondary | ICD-10-CM | POA: Diagnosis not present

## 2016-08-04 DIAGNOSIS — I639 Cerebral infarction, unspecified: Secondary | ICD-10-CM | POA: Diagnosis present

## 2016-08-04 LAB — GLUCOSE, CAPILLARY
GLUCOSE-CAPILLARY: 140 mg/dL — AB (ref 65–99)
GLUCOSE-CAPILLARY: 208 mg/dL — AB (ref 65–99)
Glucose-Capillary: 117 mg/dL — ABNORMAL HIGH (ref 65–99)
Glucose-Capillary: 188 mg/dL — ABNORMAL HIGH (ref 65–99)

## 2016-08-04 LAB — BASIC METABOLIC PANEL
ANION GAP: 6 (ref 5–15)
BUN: 12 mg/dL (ref 6–20)
CHLORIDE: 101 mmol/L (ref 101–111)
CO2: 30 mmol/L (ref 22–32)
Calcium: 8.4 mg/dL — ABNORMAL LOW (ref 8.9–10.3)
Creatinine, Ser: 0.72 mg/dL (ref 0.44–1.00)
GFR calc Af Amer: >60 mL/min (ref >60)
Glucose, Bld: 133 mg/dL — ABNORMAL HIGH (ref 65–99)
POTASSIUM: 3.9 mmol/L (ref 3.5–5.1)
SODIUM: 137 mmol/L (ref 135–145)

## 2016-08-04 MED ORDER — MAGNESIUM HYDROXIDE 400 MG/5ML PO SUSP
30.0000 mL | Freq: Every day | ORAL | Status: DC | PRN
Start: 2016-08-04 — End: 2016-08-06

## 2016-08-04 MED ORDER — GUAIFENESIN-DM 100-10 MG/5ML PO SYRP
5.0000 mL | ORAL_SOLUTION | Freq: Four times a day (QID) | ORAL | Status: DC | PRN
Start: 2016-08-04 — End: 2016-08-06
  Administered 2016-08-04: 5 mL via ORAL
  Filled 2016-08-04: qty 5

## 2016-08-04 MED ORDER — DEXTROSE 5 % IV SOLN
2.0000 g | INTRAVENOUS | Status: DC
Start: 2016-08-04 — End: 2016-08-05
  Filled 2016-08-04 (×3): qty 2

## 2016-08-04 NOTE — Progress Notes (Signed)
Patient placed on IV Rocephin for UTI appreciate neurology note possible lacunar infarct pontine segment will continue antiplatelet therapy and risk factor modification as well as physical therapy [name] J Heckman ZOX:[id number] DOB: Jan 11, 1936 DOA: 08/01/2016 PCP: [name],Obaloluwa Delatte M, MD   Physical Exam: Blood pressure (!) 107/42, pulse 74, temperature 99.8 F (37.7 C), temperature source Oral, resp. rate 16, height 5\' 4"  (1.626 m), weight 93 kg (205 lb), SpO2 100 %.   Investigations:  No results found for this or any previous visit (from the past 240 hour(s)).   Basic Metabolic Panel:  Recent Labs  81/19/1407/12/09 0601 08/04/16 0600  NA 135 137  K 4.0 3.9  CL 101 101  CO2 27 30  GLUCOSE 195* 133*  BUN 10 12  CREATININE 0.69 0.72  CALCIUM 8.7* 8.4*   Liver Function Tests:  Recent Labs  08/01/16 1715  AST 16  ALT 12*  ALKPHOS 68  BILITOT 0.4  PROT 6.9  ALBUMIN 3.9     CBC:  Recent Labs  08/01/16 1715 08/01/16 1722  WBC 10.9*  --   NEUTROABS 5.5  --   HGB 8.6* 10.5*  HCT 31.4* 31.0*  MCV 64.6*  --   PLT 338  --     No results found.    Medicatio  Impression:  Principal Problem:   Stroke [facility](HCC) Active Problems:   Hypothyroidism   DM type 2 causing complication (HCC)   Essential hypertension   OSA (obstructive sleep apnea)     Plan: Antiplatelet therapy. Aggressive control of insulin dependent diabetes hypertension hyperlipidemia continued antiplatelet therapy physical therapy for strengthening and ambulation   Consultants: Neurology   Procedures   Antibiotics:             Time spent: 30 minutes   LOS: 0 days   Courtne Lighty M   08/04/2016, 12:56 PM

## 2016-08-04 NOTE — Progress Notes (Signed)
Physical Therapy Treatment Patient Details Name: [name] [name] MRN: [id number] DOB: Aug 29, 1936 Today's Date: 08/04/2016    History of Present Illness 80 yo F admitted 08/01/2016 due to facial numbness and R UE weakness.  At 4 PM, the patient started having sudden onset of right facial numbness with numbness of her gums and throat with preservation of her tongue, along with heaviness of her right arm and that it is not working right. The patient went home and took 2 full dose aspirins and presented to the emergency department for evaluation.  MRI shows two punctate areas of diffusion abnormality in the brainstem, one is in the L lateral midbrain.  The others in the posterior lateral L pons.  PMH: ASCVD, CABG x2 in 2001, CHF with EF of 66%, arthritis, COPD, DDD- lumbar, hematuria, CVA - 2013 lacunar infarctions of R thalamus with residual L side of lip numb and L eye vision worse, MI 2001, HTN, hypothyroidism, DM2, lesion of bladder, OSA, peripheral neuropathy, appendectomy, carpal tunnel release with repair of flexor tendon, cholecystectomy, coronary angioplasty with stent placement, lumbar spine surgery, ORIF of humerus fx, R rotator cuff repair.    PT Comments    Patient found supine in bed with family member present today; he reports that prior to this event, patient was highly independent and a community ambulator, did not use an assistive device and lives with her husband, with a daughter coming to visit now and then. He also states that her husband is not in the best of health himself at this time. Patient appeared more clear today than yesterday, however remains confused; MMT to B LEs approximately 4/5 limited due to difficulty following cues for testing. Patient demonstrates improved bed mobility today, however has difficulty ambulating with walker and requires Min(A) for safety to ambulate into bathroom with device. Patient attempted bowel movement however unsuccessful. Attempted ambulation  without walker however patient immediately demonstrated anterior lean and unsteadiness today, will plan to potentially trial cane next treatment session. She was fatigued and beginning to close her eyes at EOB after trip to bathroom. Patient left in bed with HOB elevated and attempting to eat breakfast with her family member present; all needs otherwise met and bed alarm activated. At this time, due to confusion/safety concerns and fatigue limiting PT, recommend potential DC to SNF, however will plan to update DC recommendation as appropriate if needed.    Follow Up Recommendations  SNF     Equipment Recommendations  None recommended by PT    Recommendations for Other Services       Precautions / Restrictions Precautions Precautions: Fall Precaution Comments: due to confusion  Restrictions Weight Bearing Restrictions: No    Mobility  Bed Mobility Overal bed mobility: Needs Assistance Bed Mobility: Sit to Supine;Supine to Sit     Supine to sit: Min assist Sit to supine: Min guard      Transfers Overall transfer level: Needs assistance Equipment used: Rolling walker (2 wheeled) Transfers: Sit to/from Stand Sit to Stand: Min guard         General transfer comment: cues for hand placement and safety   Ambulation/Gait Ambulation/Gait assistance: Min assist Ambulation Distance (Feet): 16 Feet Assistive device: Rolling walker (2 wheeled) Gait Pattern/deviations: Step-through pattern;Drifts right/left;Trunk flexed;Wide base of support   Gait velocity interpretation: <1.8 ft/sec, indicative of risk for recurrent falls General Gait Details: very confused, required multiple cues for safety with assistive device as well as navigation in room    Stairs  Wheelchair Mobility    Modified Rankin (Stroke Patients Only)       Balance Overall balance assessment: Needs assistance Sitting-balance support: Bilateral upper extremity supported Sitting  balance-Leahy Scale: Fair Sitting balance - Comments: supervision required at all times    Standing balance support: Bilateral upper extremity supported Standing balance-Leahy Scale: Fair Standing balance comment: able to maintain balance with RW; when RW taken away, anterior lean and unsteadiness noted                     Cognition Arousal/Alertness: Awake/alert Behavior During Therapy: Flat affect;Impulsive Overall Cognitive Status: Impaired/Different from baseline Area of Impairment: Orientation;Attention;Safety/judgement;Problem solving Orientation Level: Person;Place Current Attention Level: Alternating   Following Commands: Follows one step commands inconsistently;Follows one step commands with increased time Safety/Judgement: Decreased awareness of safety;Decreased awareness of deficits   Problem Solving: Slow processing;Requires verbal cues;Requires tactile cues      Exercises      General Comments General comments (skin integrity, edema, etc.): session limited by confusion and fatigue in general today       Pertinent Vitals/Pain Pain Assessment: No/denies pain    Home Living                      Prior Function            PT Goals (current goals can now be found in the care plan section) Acute Rehab PT Goals PT Goal Formulation: Patient unable to participate in goal setting Time For Goal Achievement: 08/09/16 Potential to Achieve Goals: Fair Progress towards PT goals: Progressing toward goals    Frequency  7X/week    PT Plan Current plan remains appropriate    Co-evaluation             End of Session Equipment Utilized During Treatment: Gait belt;Oxygen Activity Tolerance: Patient tolerated treatment well;Patient limited by fatigue;No increased pain Patient left: in bed;with call bell/phone within reach;with bed alarm set;with family/visitor present     Time: 1308-6578 PT Time Calculation (min) (ACUTE ONLY): 43 min  Charges:   $Gait Training: 8-22 mins $Therapeutic Activity: 8-22 mins $Self Care/Home Management: 8-22                    G Codes:      Deniece Ree PT, DPT [phone]

## 2016-08-05 LAB — BASIC METABOLIC PANEL
Anion gap: 6 (ref 5–15)
BUN: 10 mg/dL (ref 6–20)
CALCIUM: 8.4 mg/dL — AB (ref 8.9–10.3)
CHLORIDE: 100 mmol/L — AB (ref 101–111)
CO2: 30 mmol/L (ref 22–32)
CREATININE: 0.56 mg/dL (ref 0.44–1.00)
GFR calc Af Amer: >60 mL/min (ref >60)
GFR calc non Af Amer: >60 mL/min (ref >60)
GLUCOSE: 169 mg/dL — AB (ref 65–99)
Potassium: 3.9 mmol/L (ref 3.5–5.1)
Sodium: 136 mmol/L (ref 135–145)

## 2016-08-05 LAB — GLUCOSE, CAPILLARY
GLUCOSE-CAPILLARY: 217 mg/dL — AB (ref 65–99)
Glucose-Capillary: 163 mg/dL — ABNORMAL HIGH (ref 65–99)
Glucose-Capillary: 231 mg/dL — ABNORMAL HIGH (ref 65–99)
Glucose-Capillary: 200 mg/dL — ABNORMAL HIGH (ref 65–99)

## 2016-08-05 LAB — HOMOCYSTEINE: HOMOCYSTEINE-NORM: 11.9 umol/L (ref 0.0–15.0)

## 2016-08-05 MED ORDER — CYANOCOBALAMIN 1000 MCG/ML IJ SOLN
1000.0000 ug | INTRAMUSCULAR | Status: AC
  Administered 2016-08-05: 1000 ug via INTRAMUSCULAR
  Filled 2016-08-05: qty 1

## 2016-08-05 MED ORDER — CIPROFLOXACIN HCL 250 MG PO TABS
500.0000 mg | ORAL_TABLET | Freq: Two times a day (BID) | ORAL | Status: DC
  Administered 2016-08-05 – 2016-08-06 (×2): 500 mg via ORAL
  Filled 2016-08-05 (×2): qty 2

## 2016-08-05 NOTE — Progress Notes (Addendum)
Speech Language Pathology Treatment: Cognitive-Linquistic  Patient Details Name: [name] [name] MRN: [id number] DOB: November 18, 1936 Today's Date: 08/05/2016 Time: [id number] SLP Time Calculation (min) (ACUTE ONLY): 24 min  Assessment / Plan / Recommendation Clinical Impression  ST followed up for cognitive linguistic therapy and monitoring of swallow function. Pt with improved mentation as compared to last ST encounter. Improvements also noted in speech intelligibility with motor speech skills now presenting within functional limits. Pt also reports improvements in right sided facial sensation with mild upper labial deficits present. No overt signs or symptoms of aspiration with any PO this date. Pt more alert with mild temporal disorientation to specifics of date. Pt has 24 hour care from family members at home however displays decreased executive functioning, decreased reasoning skills, as well as decreased recall of novel information. Anticipate inability to successfully manage complex ADLs. [facility] functioning and reduce fall risk. Pts spouse and son present at bedside. Educated regarding pts current cognitive deficits. All further ST needs can be addressed at next level of care.    HPI HPI: [name] GivensRuby J McMillionis a 80 y.o.femalewith a history of hypertension, hypothyroidism, insulin-dependent type 2 diabetes, coronary artery disease with CABG in 2001, grade 1 diastolic heart failure, hyperlipidemia, history of CVA and TIAs with no residual deficit, obstructive sleep apnea, and chronic pain. At 4 PM, the patient started having sudden onset of right facial numbness with numbness of her gums and throat with preservation of her tongue, along with heaviness of her right arm and that it is not working right. The patient went home and took 2 full dose aspirins and presented to the emergency department for evaluation. MRI of brain revealed two punctate  areas of diffusion abnormality in the brainstem. One is in the left lateral midbrain. The others in the posterior lateral left pons.      SLP Plan  Continue with current plan of care     Recommendations   HH ST              Follow up Recommendations: 24 hour supervision/assistance Plan: Continue with current plan of care     GO               Marcene Duoshelsea Sumney MA, CCC-SLP Acute Care Speech Language Pathologist    Kennieth RadSumney, Sincerity Cedar E 08/05/2016, 2:08 PM

## 2016-08-05 NOTE — Progress Notes (Signed)
Patient has order for CPAP but has been refusing to wear one. RT will continue to monitor.

## 2016-08-05 NOTE — Progress Notes (Signed)
[facility] A. Gerilyn Pilgrim, MD     [email or web address]          KORTNEE BAS is an 80 y.o. female.   Assessment/Plan: 1. Likely tiny subacute infarct involving the pontine region on the left side. Risk factors hypertension, diabetes, previous infarcts, dyslipidemia, obesity and age. The infarct is lacunar type infarct and I would recommend continuing with antiplatelet agent aspirin. Continue with risk factor modification.  2. Recurrent UTI with some evidence of confusion likely due to the UTI.  3. Increased risk of risk cognitive impairment due to recurrent ischemic events and the significant increased white matter disease seen on imaging.   4. Vitamin B12 deficiency. This will be replaced.     She now admits that she did have some confusion when she initially presented. She continues to have right facial dysesthesia and numbness. She also indicates that she seemed to have some episodic dysarthria although mild.   GENERAL: This a pleasant obese female in no acute distress.  HEENT: Supple. Atraumatic normocephalic.   ABDOMEN: soft  EXTREMITIES: No edema. There is significant arthritic changes of the knees bilaterally.   BACK: Normal.  SKIN: Normal by inspection.    MENTAL STATUS: Alert and oriented. She states her age and the month appropriately. No evidence of neglect. Speech, language and cognition are generally intact. Judgment and insight normal.   CRANIAL NERVES: Pupils are equal, round and reactive to light and accommodation; extra ocular movements are full, there is no significant nystagmus; visual fields are full; upper and lower facial muscles are normal in strength and symmetric, there is no flattening of the nasolabial folds; tongue is midline; uvula is midline; shoulder elevation is normal.  MOTOR: The patient has 5/5 strength throughout. There is a mild drift of the right upper extremity, right lower extremity and left lower  extremity. No drift of the left upper extremity. Bulk and tone are normal throughout.  COORDINATION: Left finger to nose is normal, right finger to nose is normal, No rest tremor; no intention tremor; no postural tremor; no bradykinesia.     Objective: Vital signs in last 24 hours: Temp:  [98.2 F (36.8 C)-98.8 F (37.1 C)] 98.2 F (36.8 C) (08/14 1510) Pulse Rate:  [66-75] 75 (08/14 1510) Resp:  [18-20] 20 (08/14 1510) BP: (102-146)/(42-79) 146/46 (08/14 1510) SpO2:  [83 %-100 %] 94 % (08/14 1510)  Intake/Output from previous day: 08/13 0701 - 08/14 0700 In: 240 [P.O.:240] Out: -  Intake/Output this shift: Total I/O In: 360 [P.O.:360] Out: -  Nutritional status: Diet Heart Room service appropriate? Yes; Fluid consistency: Thin   Lab Results: Results for orders placed or performed during the hospital encounter of 08/01/16 (from the past 48 hour(s))  Glucose, capillary     Status: Abnormal   Collection Time: 08/03/16  8:37 PM  Result Value Ref Range   Glucose-Capillary 142 (H) 65 - 99 mg/dL   Comment 1 Notify RN    Comment 2 Document in Chart   Basic metabolic panel     Status: Abnormal   Collection Time: 08/04/16  6:00 AM  Result Value Ref Range   Sodium 137 135 - 145 mmol/L   Potassium 3.9 3.5 - 5.1 mmol/L   Chloride 101 101 - 111 mmol/L   CO2 30 22 - 32 mmol/L   Glucose, Bld 133 (H) 65 - 99 mg/dL   BUN 12 6 - 20 mg/dL   Creatinine, Ser 5.86 0.44 - 1.00 mg/dL   Calcium 8.4 (L)  8.9 - 10.3 mg/dL   GFR calc non Af Amer >60 >60 mL/min   GFR calc Af Amer >60 >60 mL/min    Comment: (NOTE) The eGFR has been calculated using the CKD EPI equation. This calculation has not been validated in all clinical situations. eGFR's persistently <60 mL/min signify possible Chronic Kidney Disease.    Anion gap 6 5 - 15  Glucose, capillary     Status: Abnormal   Collection Time: 08/04/16  7:38 AM  Result Value Ref Range   Glucose-Capillary 140 (H) 65 - 99 mg/dL   Comment 1  Notify RN    Comment 2 Document in Chart   Glucose, capillary     Status: Abnormal   Collection Time: 08/04/16 11:53 AM  Result Value Ref Range   Glucose-Capillary 208 (H) 65 - 99 mg/dL   Comment 1 Notify RN    Comment 2 Document in Chart   Glucose, capillary     Status: Abnormal   Collection Time: 08/04/16  4:24 PM  Result Value Ref Range   Glucose-Capillary 117 (H) 65 - 99 mg/dL   Comment 1 Notify RN    Comment 2 Document in Chart   Glucose, capillary     Status: Abnormal   Collection Time: 08/04/16  8:25 PM  Result Value Ref Range   Glucose-Capillary 188 (H) 65 - 99 mg/dL   Comment 1 Notify RN    Comment 2 Document in Chart   Basic metabolic panel     Status: Abnormal   Collection Time: 08/05/16  7:11 AM  Result Value Ref Range   Sodium 136 135 - 145 mmol/L   Potassium 3.9 3.5 - 5.1 mmol/L   Chloride 100 (L) 101 - 111 mmol/L   CO2 30 22 - 32 mmol/L   Glucose, Bld 169 (H) 65 - 99 mg/dL   BUN 10 6 - 20 mg/dL   Creatinine, Ser 0.56 0.44 - 1.00 mg/dL   Calcium 8.4 (L) 8.9 - 10.3 mg/dL   GFR calc non Af Amer >60 >60 mL/min   GFR calc Af Amer >60 >60 mL/min    Comment: (NOTE) The eGFR has been calculated using the CKD EPI equation. This calculation has not been validated in all clinical situations. eGFR's persistently <60 mL/min signify possible Chronic Kidney Disease.    Anion gap 6 5 - 15  Glucose, capillary     Status: Abnormal   Collection Time: 08/05/16  7:26 AM  Result Value Ref Range   Glucose-Capillary 163 (H) 65 - 99 mg/dL  Glucose, capillary     Status: Abnormal   Collection Time: 08/05/16 11:49 AM  Result Value Ref Range   Glucose-Capillary 217 (H) 65 - 99 mg/dL  Glucose, capillary     Status: Abnormal   Collection Time: 08/05/16  4:14 PM  Result Value Ref Range   Glucose-Capillary 231 (H) 65 - 99 mg/dL   Comment 1 Notify RN    Comment 2 Document in Chart     Lipid Panel No results for input(s): CHOL, TRIG, HDL, CHOLHDL, VLDL, LDLCALC in the last 72  hours.  Studies/Results:   Medications:  Scheduled Meds: .  stroke: mapping our early stages of recovery book   Does not apply Once  . aspirin EC  325 mg Oral Daily  . ciprofloxacin  500 mg Oral BID  . enoxaparin (LOVENOX) injection  40 mg Subcutaneous Q24H  . insulin aspart  0-15 Units Subcutaneous TID WC  . insulin aspart  0-5 Units  Subcutaneous QHS  . insulin aspart protamine- aspart  20 Units Subcutaneous TID AC  . levothyroxine  25 mcg Oral QAC breakfast  . LORazepam  2 mg Oral QHS  . metFORMIN  500 mg Oral BID WC  . pravastatin  40 mg Oral QHS   Continuous Infusions:  PRN Meds:.albuterol, diphenhydrAMINE, guaiFENesin-dextromethorphan, magnesium hydroxide, nitroGLYCERIN, ondansetron (ZOFRAN) IV, oxyCODONE-acetaminophen **AND** oxyCODONE, senna-docusate     LOS: 1 day   Skylene Deremer A. Merlene Laughter, M.D.  Diplomate, Tax adviser of Psychiatry and Neurology ( Neurology).

## 2016-08-05 NOTE — Progress Notes (Signed)
Inpatient Diabetes Program Recommendations  AACE/ADA: New Consensus Statement on Inpatient Glycemic Control (2015)  Target Ranges:  Prepandial:   less than 140 mg/dL      Peak postprandial:   less than 180 mg/dL (1-2 hours)      Critically ill patients:  140 - 180 mg/dL   Lab Results  Component Value Date   GLUCAP 217 (H) 08/05/2016   HGBA1C 8.5 (H) 08/01/2016    Review of Glycemic Control:  Results for [name] [name], [name] [name] (MRN [id number]) as of 08/05/2016 15:23  Ref. Range 08/04/2016 07:38 08/04/2016 11:53 08/04/2016 16:24 08/04/2016 20:25 08/05/2016 07:26 08/05/2016 11:49  Glucose-Capillary Latest Ref Range: 65 - 99 mg/dL 409140 (H) 811208 (H) 914117 (H) 188 (H) 163 (H) 217 (H)    Diabetes history: Type 2 diabetes Outpatient Diabetes medications: 70/30 20 units tid with meals  Current orders for Inpatient glycemic control:  Novolin 70/30 20 units tid with meals, Novolog moderate tid with meals and HS, Metformin 500 mg bid  Inpatient Diabetes Program Recommendations:    Please consider d/c of lunch (1200) time 70/30 and continue breakfast (0800) and suppertime (1700) doses.  Patient did receive 70/30 this morning with breakfast but did not receive lunch time dose according to RN.  Will follow.  Thanks, Beryl MeagerJenny Jaquelyn Sakamoto, RN, BC-ADM Inpatient Diabetes Coordinator Pager [phone] (8a-5p)

## 2016-08-05 NOTE — Progress Notes (Signed)
Physical Therapy Treatment Patient Details Name: Lise AuerRuby J Toohey MRN: [id number] DOB: December 08, 1936 Today's Date: 08/05/2016    History of Present Illness 80 yo F admitted 08/01/2016 due to facial numbness and R UE weakness.  At 4 PM, the patient started having sudden onset of right facial numbness with numbness of her gums and throat with preservation of her tongue, along with heaviness of her right arm and that it is not working right. The patient went home and took 2 full dose aspirins and presented to the emergency department for evaluation.  MRI shows two punctate areas of diffusion abnormality in the brainstem, one is in the L lateral midbrain.  The others in the posterior lateral L pons.  PMH: ASCVD, CABG x2 in 2001, CHF with EF of 66%, arthritis, COPD, DDD- lumbar, hematuria, CVA - 2013 lacunar infarctions of R thalamus with residual L side of lip numb and L eye vision worse, MI 2001, HTN, hypothyroidism, DM2, lesion of bladder, OSA, peripheral neuropathy, appendectomy, carpal tunnel release with repair of flexor tendon, cholecystectomy, coronary angioplasty with stent placement, lumbar spine surgery, ORIF of humerus fx, R rotator cuff repair.    PT Comments    Pt received sitting on the EOB, and was agreeable to PT tx.  Husband and another female family member present, and pt was agreeable to PT tx.  Pt expressed that her mobility is at baseline.  However, in the setting of pt having had multiple strokes, as well as SLP noting moderate cognitive deficits, she would benefit from at least a home safety evaluation once she is discharged home.     Follow Up Recommendations  Home health PT (Safety assessment)     Equipment Recommendations  None recommended by PT    Recommendations for Other Services       Precautions / Restrictions Precautions Precautions: Fall Restrictions Weight Bearing Restrictions: No    Mobility  Bed Mobility                  Transfers     Transfers:  Sit to/from Stand Sit to Stand: Supervision            Ambulation/Gait Ambulation/Gait assistance: Min guard Ambulation Distance (Feet): 200 Feet Assistive device: None Gait Pattern/deviations: Step-through pattern     General Gait Details: no LOB, good speed, pt was able to find her way back to the room.    Stairs            Wheelchair Mobility    Modified Rankin (Stroke Patients Only)       Balance Overall balance assessment: Independent                                  Cognition Arousal/Alertness: Awake/alert Behavior During Therapy: WFL for tasks assessed/performed Overall Cognitive Status: Within Functional Limits for tasks assessed                      Exercises      General Comments        Pertinent Vitals/Pain Pain Assessment: No/denies pain    Home Living                      Prior Function            PT Goals (current goals can now be found in the care plan section) Acute Rehab PT Goals PT Goal Formulation: With  patient/family Time For Goal Achievement: 08/09/16 Potential to Achieve Goals: Fair Progress towards PT goals: Progressing toward goals    Frequency  7X/week    PT Plan Current plan remains appropriate    Co-evaluation             End of Session Equipment Utilized During Treatment: Gait belt Activity Tolerance: Patient tolerated treatment well Patient left: with call bell/phone within reach;with family/visitor present (Sitting on the EOB. )     Time: [id number] PT Time Calculation (min) (ACUTE ONLY): 8 min  Charges:  $Gait Training: 8-22 mins                    G Codes:      Beth Skarleth Delmonico, PT, DPT X: [phone]

## 2016-08-05 NOTE — Progress Notes (Signed)
Patient not placed on IV Rocephin yesterday for UTI due to lack of IV access will begin Cipro 500 by mouth twice a day today for 5 days patient had poor glycemic control as outpatient as she cannot afford various insulins prescribed and sampled at the office will attempt to rectify this prior to discharge she is hemodynamically stable alert and oriented with no new focal neurologic deficits [name] J Barro BJY:[id number] DOB: [birth year] DOA: 08/01/2016 PCP: [name],Gianina Olinde M, MD   Physical Exam: Blood pressure (!) 108/42, pulse 66, temperature 98.6 F (37 C), temperature source Oral, resp. rate 18, height 5\' 4"  (1.626 m), weight 93 kg (205 lb), SpO2 96 %. Lungs clear to A&P no rales wheeze rhonchi heart regular rhythm no murmurs goes he feels rubs abdomen soft nontender bowel sounds normoactive patient was all 4 extremities minus downgoing no radial nerve deficits that are appreciable at present   Investigations:  No results found for this or any previous visit (from the past 240 hour(s)).   Basic Metabolic Panel:  Recent Labs  08/65/7808/13/17 0600 08/05/16 0711  NA 137 136  K 3.9 3.9  CL 101 100*  CO2 30 30  GLUCOSE 133* 169*  BUN 12 10  CREATININE 0.72 0.56  CALCIUM 8.4* 8.4*   Liver Function Tests: No results for input(s): AST, ALT, ALKPHOS, BILITOT, PROT, ALBUMIN in the last 72 hours.   CBC: No results for input(s): WBC, NEUTROABS, HGB, HCT, MCV, PLT in the last 72 hours.  No results found.    Medications:  Impression:  Principal Problem:   Stroke [facility](HCC) Active Problems:   Hypothyroidism   DM type 2 causing complication (HCC)   Essential hypertension   OSA (obstructive sleep apnea)     Plan:Plan discharge in 24-48 hours need physical ther  Consultants: Neurology   Procedures   Antibiotics: Cipro 500 mg by mouth twice a day for 5 days         Time spent: 30 minutes   LOS: 1 day   Ewan Grau M   08/05/2016, 12:39 PM

## 2016-08-05 NOTE — Care Management Important Message (Signed)
Important Message  Patient Details  Name: Lise AuerRuby J Belding MRN: [id number] Date of Birth: [birth year]   Medicare Important Message Given:  Yes    Malcolm MetroChildress, Brad Lieurance Demske, RN 08/05/2016, 3:23 PM

## 2016-08-06 LAB — GLUCOSE, CAPILLARY
GLUCOSE-CAPILLARY: 230 mg/dL — AB (ref 65–99)
Glucose-Capillary: 177 mg/dL — ABNORMAL HIGH (ref 65–99)

## 2016-08-06 MED ORDER — SENNOSIDES-DOCUSATE SODIUM 8.6-50 MG PO TABS: 1.0000 | ORAL_TABLET | Freq: Every evening | ORAL | 1 refills | Status: DC | PRN

## 2016-08-06 MED ORDER — CIPROFLOXACIN HCL 500 MG PO TABS: 500.0000 mg | ORAL_TABLET | Freq: Two times a day (BID) | ORAL | 0 refills | Status: AC

## 2016-08-06 NOTE — Care Management Note (Signed)
Case Management Note  Patient Details  Name: [name] AuerRuby J [name] MRN: [id number] Date of Birth: 1936-07-07  Expected Discharge Date:    08/06/2016              Expected Discharge Plan:  Home w Home Health Services  In-House Referral:  NA  Discharge planning Services  CM Consult  Post Acute Care Choice:  Home Health Choice offered to:  Patient  DME Arranged:    DME Agency:     HH Arranged:  RN, Nurse's Aide, Social Work [employer] Agency:  [facility]  Status of Service:  Completed, signed off  If discussed at [employer] Length of [employer] Meetings, dates discussed:    Additional Comments: [facility] Alroy BailiffLinda Lothian, of [facility], made aware of DC today and she will obtain orders from chart. [facility]. No DME needed.  Malcolm Metrohildress, Deon Ivey Demske, RN 08/06/2016, 12:52 PM

## 2016-08-06 NOTE — Discharge Planning (Signed)
Pt was discharged home with home health, vitals stable, no c/o pain, pt belongings gone with pt, discharge paperwork explained to pt, no questions for nurse

## 2016-08-06 NOTE — Discharge Summary (Signed)
Physician Discharge Summary  [name] [name] UEA:[id number] DOB: 1936/07/05 DOA: 08/01/2016  PCP: [name], MD  Admit date: 08/01/2016 Discharge date: 08/06/2016   Recommendations for Outpatient Follow-up:  The patient is a 80 year old white female with long-standing insulin-dependent type 2 diabetes hypertension hyperlipidemia peripheral arterial disease cerebrovascular disease has had a history of stroke is essentially admitted with some altered mental status and some mild alterations of her trigeminal nerve which tended to resolve and MRA MRI revealed punctate pontine possible CVA with MRA indicating significant stenosis of the circle of Willis and the posterior cerebral artery distribution as well as carotid distribution she was placed on full dose aspirin and Lovenox for DVT prophylaxis seen in consultation by neurology who felt that the main goal of therapy was to limit risk factors for subsequent strokes this was addressed with control hemodynamics and good glycemic control as well as anti-hyperlipidemic control is offered skilled nursing facility she declined was felt that she needed some assistance at home in form of physical therapy and nursing and this was added at time of discharge she'll follow-up in my office in one week's time to assess hemodynamic stability lab data and she'll follow-up likewise with neurology Discharge Diagnoses:  Principal Problem:   Stroke [facility]) Active Problems:   Hypothyroidism   DM type 2 causing complication (HCC)   Essential hypertension   OSA (obstructive sleep apnea)   Discharge Condition: Good  Filed Weights   08/01/16 1709  Weight: 93 kg (205 lb)    History of present illness:  Patient is 80 year old white female admitted with long-standing insulin diabetes type 2 chronic noncompliance hypertension hyperlipidemia peripheral arterial disease since her cerebrovascular disease. She has a history of CVA which is poorly documented and  she was admitted with some altered mental status and some paresthesias dysesthesias of the right trigeminal distribution MR A MRI revealed multiple significant stenosis of the circle of Willis distribution as well as carotid distribution and posterior cerebral artery MRI revealed possible small punctate lesion of the brainstem transient consultation by cardiology who felt that control of risk factors for subsequent stroke with some ankle therapy at this point this was to address hypertensive control hyperlipidemic control and glycemic control this will be done as an outpatient she was offered skilled nursing facility patient declined risk benefits explained and she was subtotally given home healthcare in form of RN as well as physical therapy including ambulation and confusion concerning any medicines administered  Hospital Course:  See history of present illness above  Procedures:  MRA MRI   Consultations:  Neurology  Discharge Instructions  Discharge Instructions    Face-to-face encounter (required for Medicare/Medicaid patients)    Complete by:  As directed   I [name] M certify that this patient is under my care and that I, or a nurse practitioner or physician's assistant working with me, had a face-to-face encounter that meets the physician face-to-face encounter requirements with this patient on 08/06/2016. The encounter with the patient was in whole, or in part for the following medical condition(s) which is the primary reason for home health care (List medical condition): CVA,IDDM,HTN, PAD, HYPERLIDEMIA   The encounter with the patient was in whole, or in part, for the following medical condition, which is the primary reason for home health care:  cva, PAD, IDDM Hyperlipidemia, HTN   I certify that, based on my findings, the following services are medically necessary home health services:   Nursing Physical therapy     Reason  for Medically Necessary Home Health Services:   Skilled Nursing- Changes in Medication/Medication Management   My clinical findings support the need for the above services:  Cognitive impairments, dementia, or mental confusion  that make it unsafe to leave home   Further, I certify that my clinical findings support that this patient is homebound due to:   Ambulates short distances less than 300 feet Unsafe ambulation due to balance issues     Home Health    Complete by:  As directed   To provide the following care/treatments:   PT RN         Medication List    TAKE these medications   albuterol (2.5 MG/3ML) 0.083% nebulizer solution Commonly known as:  PROVENTIL Take 3 mLs (2.5 mg total) by nebulization every 4 (four) hours as needed for wheezing or shortness of breath.   aspirin EC 325 MG tablet Take 325 mg by mouth daily.   ciprofloxacin 500 MG tablet Commonly known as:  CIPRO Take 1 tablet (500 mg total) by mouth 2 (two) times daily.   levothyroxine 25 MCG tablet Commonly known as:  SYNTHROID, LEVOTHROID Take 25 mcg by mouth every morning.   lisinopril 10 MG tablet Commonly known as:  PRINIVIL,ZESTRIL Take 1 tablet by mouth every morning.   LORazepam 2 MG tablet Commonly known as:  ATIVAN Take 1 tablet by mouth at bedtime.   metFORMIN 500 MG tablet Commonly known as:  GLUCOPHAGE Take 500 mg by mouth 2 (two) times daily with a meal.   multivitamin with minerals Tabs tablet Take 1 tablet by mouth daily.   nitroGLYCERIN 0.4 MG SL tablet Commonly known as:  NITROSTAT Place 0.4 mg under the tongue every 5 (five) minutes as needed for chest pain.   NOVOLIN 70/30 RELION (70-30) 100 UNIT/ML injection Generic drug:  insulin NPH-regular Human Inject 20 Units into the skin 3 (three) times daily before meals.   oxyCODONE-acetaminophen 10-325 MG tablet Commonly known as:  PERCOCET Take 1 tablet by mouth 4 (four) times daily as needed for pain.   pravastatin 40 MG tablet Commonly known as:  PRAVACHOL Take 20 mg by  mouth at bedtime.   senna-docusate 8.6-50 MG tablet Commonly known as:  Senokot-S Take 1 tablet by mouth at bedtime as needed for mild constipation.   TRESIBA FLEXTOUCH 200 UNIT/ML Sopn Generic drug:  Insulin Degludec Inject 35-70 Units into the skin 2 (two) times daily. 35 units in the morning and 70 units in the evening      Allergies  Allergen Reactions  . Codeine Itching   Follow-up Information    [facility] .   Why:  Home Health once discharged-RN and aide Will make first visit within 48 hours after discharged home Contact information: [address] [place] [id number] [phone]            The results of significant diagnostics from this hospitalization (including imaging, microbiology, ancillary and laboratory) are listed below for reference.    Significant Diagnostic Studies: Dg Chest 2 View  Result Date: 08/01/2016 CLINICAL DATA:  Possible stroke. EXAM: CHEST  2 VIEW COMPARISON:  04/21/2016 FINDINGS: Postsurgical changes from median sternotomy and CABG are stable. The cardiac silhouette is mildly enlarged. Mediastinal contours appear intact. There is no evidence of focal airspace consolidation, pleural effusion or pneumothorax. Left upper lobe atelectasis is noted. Osseous structures are without acute abnormality. Soft tissues are grossly normal. IMPRESSION: Cardiomegaly without evidence of pulmonary edema. Left upper lobe atelectasis. Electronically  Signed   By: Ted Mcalpine M.D.   On: 08/01/2016 20:27   [name] Wo Contrast  Result Date: 08/02/2016 CLINICAL DATA:  New onset of right facial numbness. Abnormal feeling in the right upper extremity. EXAM: MRA HEAD WITHOUT CONTRAST TECHNIQUE: Angiographic images of the Circle of Willis were obtained using MRA technique without intravenous contrast. COMPARISON:  MRI brain from the same day. MRA circle of Willis 03/18/2016. FINDINGS: Mild atherosclerotic changes are present within  the cavernous internal carotid arteries bilaterally, left greater than right. There is a high-grade stenosis of the proximal right A1 segment, unchanged. The anterior communicating artery is patent in both A2 segments fill from the left. The M1 segments are intact. The MCA bifurcations are within normal limits bilaterally. There is signal loss suggesting a high-grade stenosis in the proximal inferior right M2 branch. Moderate distal segmental MCA stenoses are present bilaterally, left greater than right. No other significant proximal stenosis is present. The right vertebral artery is slightly dominant to the left. The right PICA origin is visualized and normal. The vertebrobasilar junction is normal. The basilar artery is within normal limits. The left posterior cerebral artery originates from the basilar tip. The right posterior cerebral artery is of fetal type. There is significant proximal signal loss within the right posterior cerebral artery. Moderate more distal signal loss is present in the left PCA. IMPRESSION: 1. Stable MRA circle of Willis. 2. High-grade stenosis in the proximal right A1 segment. 3. Moderate diffuse medium and distal small vessel disease within anterior branch vessels. The left MCA is the worst. 4. Severe signal loss within right posterior cerebral artery, a fetal type PCA. 5. Moderate signal loss within more distal PCA branch vessels on the left. Electronically Signed   By: Marin Roberts M.D.   On: 08/02/2016 08:00   [name] Contrast  Result Date: 08/02/2016 CLINICAL DATA:  Right facial numbness and abnormal feeling in the right upper extremity for 1 day. Stroke. EXAM: MRI HEAD WITHOUT CONTRAST TECHNIQUE: Multiplanar, multiecho pulse sequences of the brain and surrounding structures were obtained without intravenous contrast. COMPARISON:  CT head without contrast 08/01/2016 MRI brain 04/04/2016. FINDINGS: A subtle area of diffusion abnormality is present within the left  lateral midbrain. An additional punctate area of restricted diffusion is evident within the posterior left pons. No acute cortical infarct is present. Moderate confluent periventricular and subcortical T2 changes bilaterally are otherwise stable. With Internal auditory canals are within normal limits bilaterally. White matter changes extend into the brainstem. Flow is present in the major intracranial arteries. Bilateral lens replacements are present. The globes and orbits are otherwise intact. The paranasal sinuses are clear. There is fluid in the mastoid air cells bilaterally. No obstructing nasopharyngeal lesion is present. IMPRESSION: 1. Two punctate areas of diffusion abnormality in the brainstem. One is in the left lateral midbrain. The others in the posterior lateral left pons. These post correspond with the patient's symptoms. 2. Otherwise stable moderate periventricular and subcortical T2 changes bilaterally with extension in the brainstem. These likely reflect the sequela of chronic microvascular ischemia. 3. Bilateral mastoid effusions. No obstructing nasopharyngeal lesion is evident. Electronically Signed   By: Marin Roberts M.D.   On: 08/02/2016 07:56   US Carotid Bilateral (at [facility] Only)  Result Date: 08/02/2016 CLINICAL DATA:  80 year old female with known cardiovascular disease and sudden onset right facial and upper extremity numbness EXAM: BILATERAL CAROTID DUPLEX ULTRASOUND TECHNIQUE: Wallace Cullens scale imaging, color Doppler and duplex ultrasound  were performed of bilateral carotid and vertebral arteries in the neck. COMPARISON:  Prior duplex carotid ultrasound 03/19/2016 FINDINGS: Criteria: Quantification of carotid stenosis is based on velocity parameters that correlate the residual internal carotid diameter with NASCET-based stenosis levels, using the diameter of the distal internal carotid lumen as the denominator for stenosis measurement. The following velocity measurements were  obtained: RIGHT ICA:  89/17 cm/sec CCA:  127/18 cm/sec SYSTOLIC ICA/CCA RATIO:  0.7 DIASTOLIC ICA/CCA RATIO:  0.8 ECA:  112 cm/sec LEFT ICA:  140/42 cm/sec CCA:  106/26 cm/sec SYSTOLIC ICA/CCA RATIO:  1.3 DIASTOLIC ICA/CCA RATIO:  2.4 ECA:  119 cm/sec RIGHT CAROTID ARTERY: Intimal medial thickening. Mild smooth heterogeneous atherosclerotic plaque in the proximal internal carotid artery. By peak systolic velocity criteria the estimated stenosis remains less than 50%. RIGHT VERTEBRAL ARTERY:  Patent with antegrade flow. LEFT CAROTID ARTERY: Heterogeneous atherosclerotic plaque in the proximal internal carotid artery. By peak systolic velocity criteria the estimated stenosis falls in the 50- 69% range. LEFT VERTEBRAL ARTERY:  Patent with antegrade flow. IMPRESSION: 1. Mild (1-49%) stenosis proximal right internal carotid artery secondary to heterogenous atherosclerotic plaque. 2. Moderate (50-69%) stenosis proximal left internal carotid artery secondary to heterogenous atherosclerotic plaque. 3. Vertebral arteries are patent with normal antegrade flow. Signed, Sterling BigHeath K. McCullough, MD [facility] Electronically Signed   By: Malachy MoanHeath  McCullough M.D.   On: 08/02/2016 08:33   Ct Head Code Stroke W/o Cm  Result Date: 08/01/2016 CLINICAL DATA:  Code stroke. Right-sided facial numbness and tingling down the right side of the throat. Right arm weakness. EXAM: CT HEAD WITHOUT CONTRAST TECHNIQUE: Contiguous axial images were obtained from the base of the skull through the vertex without intravenous contrast. COMPARISON:  Head CT and MRI 04/04/2016 FINDINGS: The study is mildly motion degraded without evidence of acute intracranial hemorrhage are acute large territory infarct within this limitation. No mass, midline shift, or extra-axial fluid collection is seen. Cerebral atrophy is unchanged. Cerebral white matter hypodensities are unchanged and nonspecific but  compatible with moderate chronic small vessel ischemic disease. Prior bilateral cataract extraction is noted. There is opacification of a posterior right ethmoid air cell. Chronic inferior left mastoid air cell opacification is unchanged. Calcified atherosclerosis is noted at the skullbase. No acute osseous abnormality is identified. ASPECTS [facility]([place] Stroke Program Early CT Score, [email or web address]) - Ganglionic level infarction (caudate, lentiform nuclei, internal capsule, insula, M1-M3 cortex): 7 - Supraganglionic infarction (M4-M6 cortex): 3 Total score (0-10 with 10 being normal): 10 IMPRESSION: 1. Mild motion degradation without evidence of acute intracranial abnormality. 2. ASPECTS score 10. 3. Moderate chronic small vessel ischemic disease. These results were called by telephone at the time of interpretation on 08/01/2016 at 5:25 pm to Dr. Eber HongBRIAN MILLER , who verbally acknowledged these results. Electronically Signed   By: Sebastian AcheAllen  Grady M.D.   On: 08/01/2016 17:26    Microbiology: No results found for this or any previous visit (from the past 240 hour(s)).   Labs: Basic Metabolic Panel:  Recent Labs Lab 08/01/16 1715 08/01/16 1722 08/03/16 0601 08/04/16 0600 08/05/16 0711  NA 135 140 135 137 136  K 3.7 3.8 4.0 3.9 3.9  CL 104 101 101 101 100*  CO2 27  --  27 30 30   GLUCOSE 158* 155* 195* 133* 169*  BUN 10 8 10 12 10   CREATININE 0.63 0.70 0.69 0.72 0.56  CALCIUM 8.7*  --  8.7* 8.4* 8.4*   Liver Function Tests:  Recent Labs Lab  08/01/16 1715  AST 16  ALT 12*  ALKPHOS 68  BILITOT 0.4  PROT 6.9  ALBUMIN 3.9   No results for input(s): LIPASE, AMYLASE in the last 168 hours. No results for input(s): AMMONIA in the last 168 hours. CBC:  Recent Labs Lab 08/01/16 1715 08/01/16 1722  WBC 10.9*  --   NEUTROABS 5.5  --   HGB 8.6* 10.5*  HCT 31.4* 31.0*  MCV 64.6*  --   PLT 338  --    Cardiac Enzymes: No results for input(s): CKTOTAL, CKMB, CKMBINDEX, TROPONINI  in the last 168 hours. BNP: BNP (last 3 results)  Recent Labs  04/20/16 1340 04/22/16 0022  BNP 140.0* 47.0    ProBNP (last 3 results) No results for input(s): PROBNP in the last 8760 hours.  CBG:  Recent Labs Lab 08/05/16 1149 08/05/16 1614 08/05/16 2047 08/06/16 0727 08/06/16 1121  GLUCAP 217* 231* 200* 177* 230*       Signed:  Kendy Haston M  [facility] Pager: [phone] 08/06/2016, 11:50 AM

## 2016-08-06 NOTE — Progress Notes (Signed)
Physical Therapy Treatment Patient Details Name: [name] AuerRuby J [name] MRN: [id number] DOB: 1936-03-06 Today's Date: 08/06/2016    History of Present Illness 80 yo F admitted 08/01/2016 due to facial numbness and R UE weakness.  At 4 PM, the patient started having sudden onset of right facial numbness with numbness of her gums and throat with preservation of her tongue, along with heaviness of her right arm and that it is not working right. The patient went home and took 2 full dose aspirins and presented to the emergency department for evaluation.  MRI shows two punctate areas of diffusion abnormality in the brainstem, one is in the L lateral midbrain.  The others in the posterior lateral L pons.  PMH: ASCVD, CABG x2 in 2001, CHF with EF of 66%, arthritis, COPD, DDD- lumbar, hematuria, CVA - 2013 lacunar infarctions of R thalamus with residual L side of lip numb and L eye vision worse, MI 2001, HTN, hypothyroidism, DM2, lesion of bladder, OSA, peripheral neuropathy, appendectomy, carpal tunnel release with repair of flexor tendon, cholecystectomy, coronary angioplasty with stent placement, lumbar spine surgery, ORIF of humerus fx, R rotator cuff repair.    PT Comments    Pt received in bed, and was agreeable to PT tx, however she continues to demonstrate some significant balance deficits and scored a 41/56 on the BERG.  Continue to recommend HHPT upon d/c.   Follow Up Recommendations  Home health PT     Equipment Recommendations  None recommended by PT    Recommendations for Other Services       Precautions / Restrictions Precautions Precautions: Fall Precaution Comments: due to decreaed balance at times.  Restrictions Weight Bearing Restrictions: No    Mobility  Bed Mobility Overal bed mobility: Modified Independent       Supine to sit: Modified independent (Device/Increase time)        Transfers Overall transfer level: Needs assistance Equipment used: None Transfers: Sit  to/from Stand Sit to Stand: Supervision            Ambulation/Gait Ambulation/Gait assistance: Min guard Ambulation Distance (Feet): 300 Feet Assistive device: None Gait Pattern/deviations: Step-through pattern     General Gait Details: Pt noted to be more unsteady today compared to yesterday, where she is veering from path much more today, and reaching for the wall to hold on to at times.    Stairs            Wheelchair Mobility    Modified Rankin (Stroke Patients Only)       Balance                                    Cognition Arousal/Alertness: Awake/alert Behavior During Therapy: WFL for tasks assessed/performed Overall Cognitive Status: Within Functional Limits for tasks assessed                      Exercises      General Comments        Pertinent Vitals/Pain Pain Assessment: No/denies pain    Home Living                      Prior Function            PT Goals (current goals can now be found in the care plan section) Acute Rehab PT Goals PT Goal Formulation: With patient/family Time For Goal Achievement: 08/09/16 Potential  to Achieve Goals: Fair Progress towards PT goals: Progressing toward goals    Frequency  7X/week    PT Plan Current plan remains appropriate    Co-evaluation             End of Session Equipment Utilized During Treatment: Gait belt Activity Tolerance: Patient tolerated treatment well Patient left: in chair;with call bell/phone within reach     Time: 1046-1101 PT Time Calculation (min) (ACUTE ONLY): 15 min  Charges:  $Physical Performance Test: 8-22 mins                    G Codes:      [name] [name] 08/06/2016, 12:48 PM

## 2016-08-28 IMAGING — CR DG CHEST 2V
2 series · 2 of 2 positions shown · non-contrast
Comparison: 12/12/2013

CLINICAL DATA: Near syncope.  Dizziness

EXAM:
CHEST  2 VIEW

[view not recorded (1 of 2)]
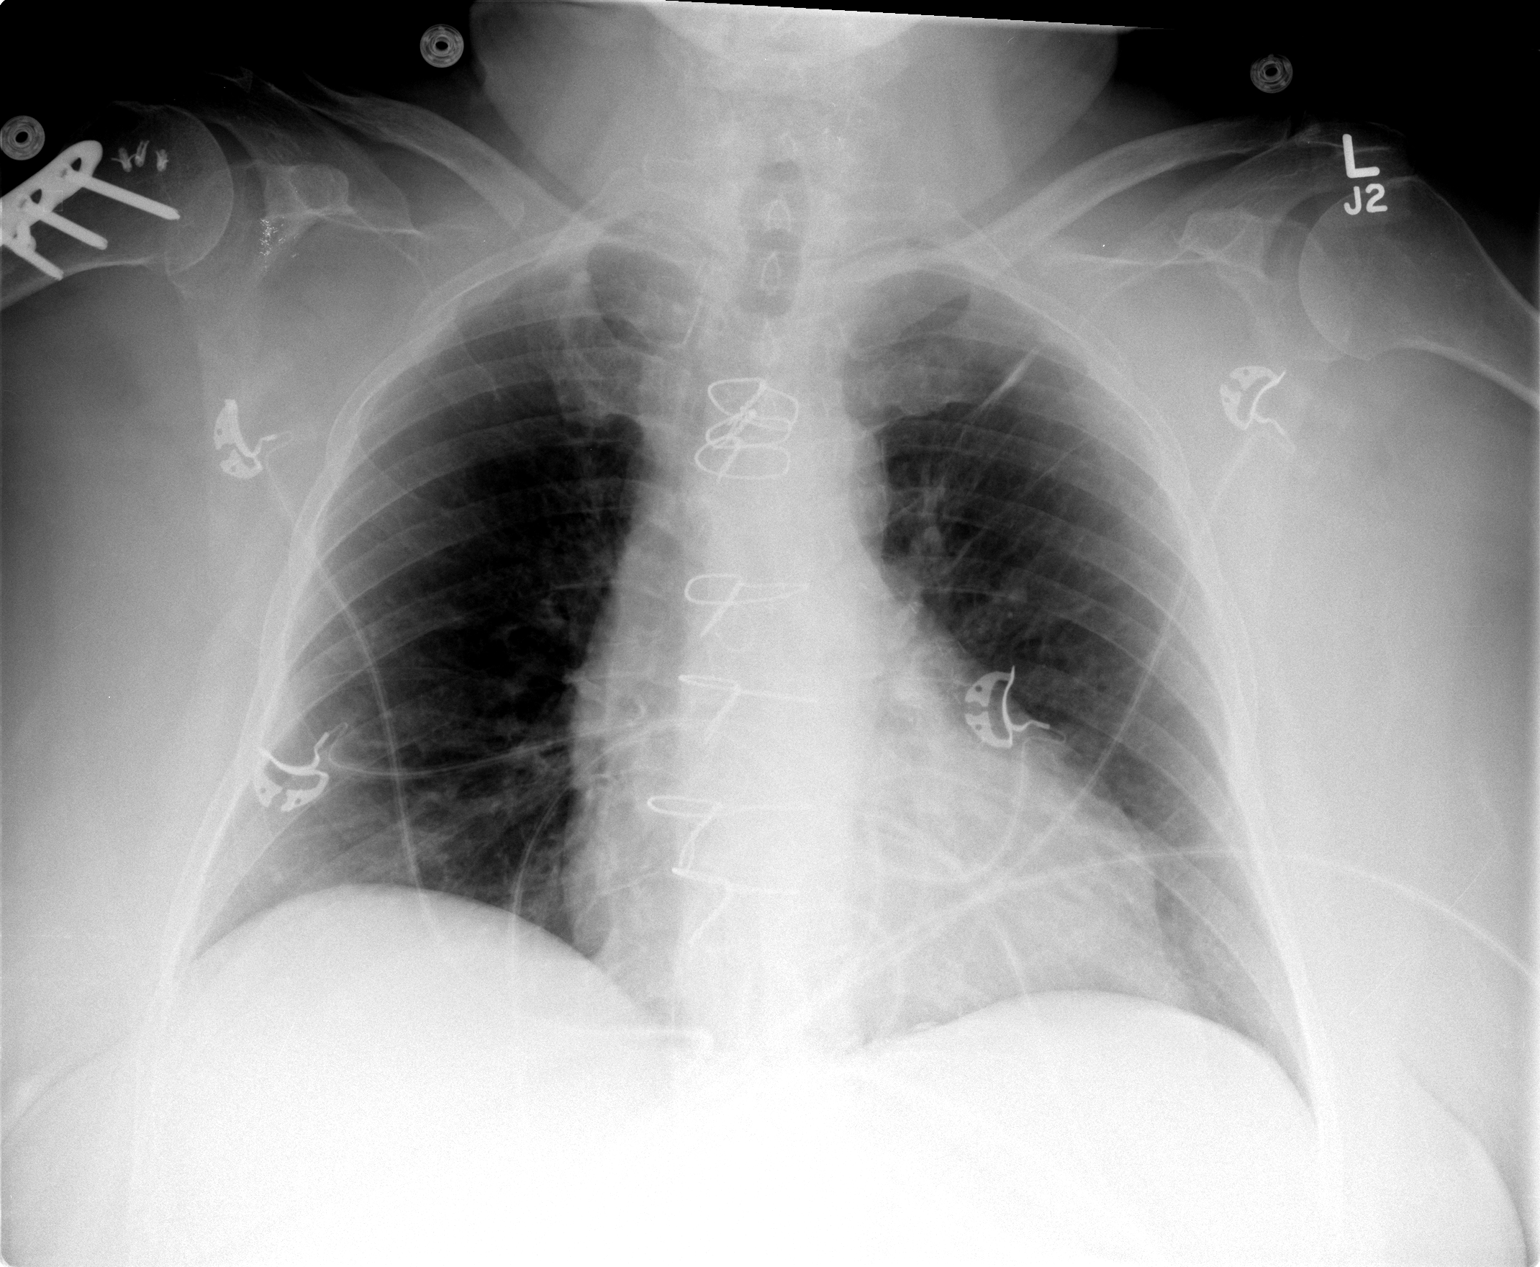

[view not recorded (2 of 2)]
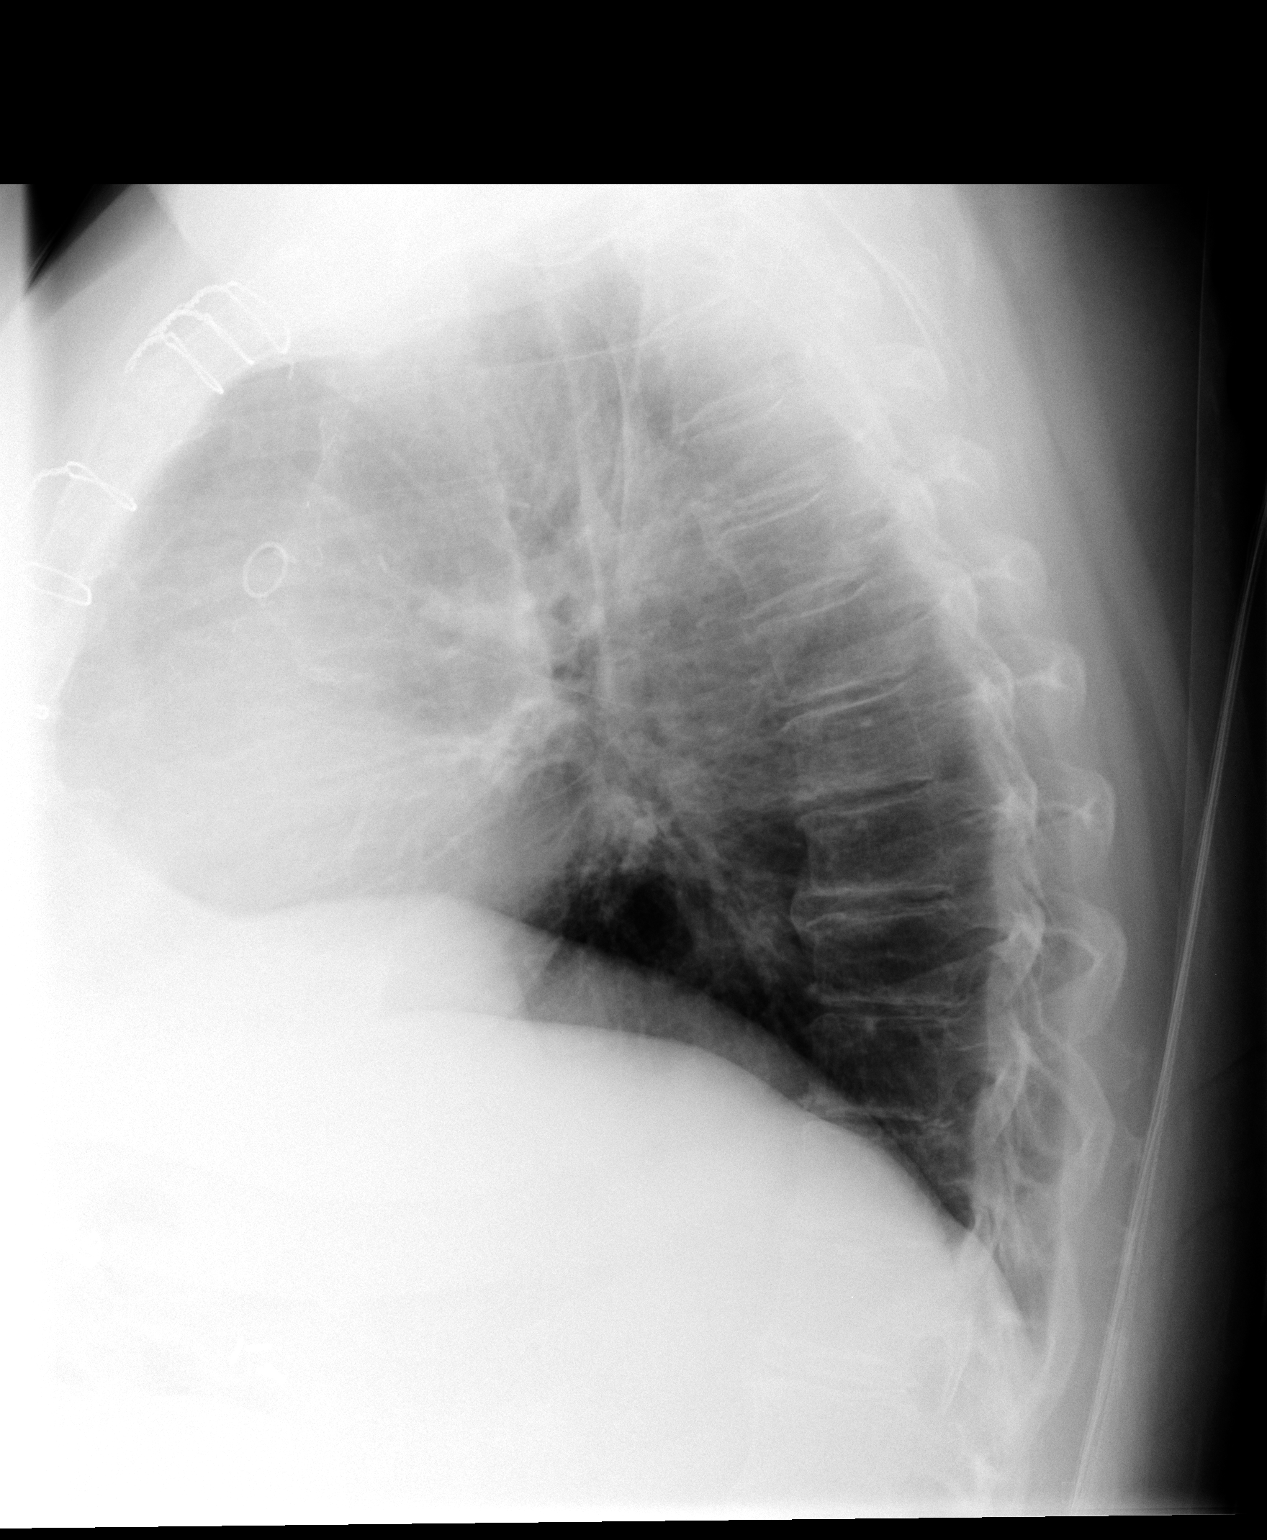

[2 of 2 positions shown; findings below may reference images not displayed]

FINDINGS: Mild cardiomegaly which is stable from prior. The patient has
undergone CABG. Negative aortic contours.

There is no edema, consolidation, effusion, or pneumothorax. Mild
scarring or atelectasis in the left upper lung.

Proximal right humerus ORIF and soft tissue anchoring.
IMPRESSION: No active cardiopulmonary disease.

## 2016-09-01 IMAGING — CR DG CHEST 2V
2 series · 2 of 2 positions shown · non-contrast
Comparison: 08/24/2014; 12/12/2013

CLINICAL DATA: History of diabetes, CAD, now with wheezing
following bladder procedure

EXAM:
CHEST  2 VIEW

[view not recorded (1 of 2)]
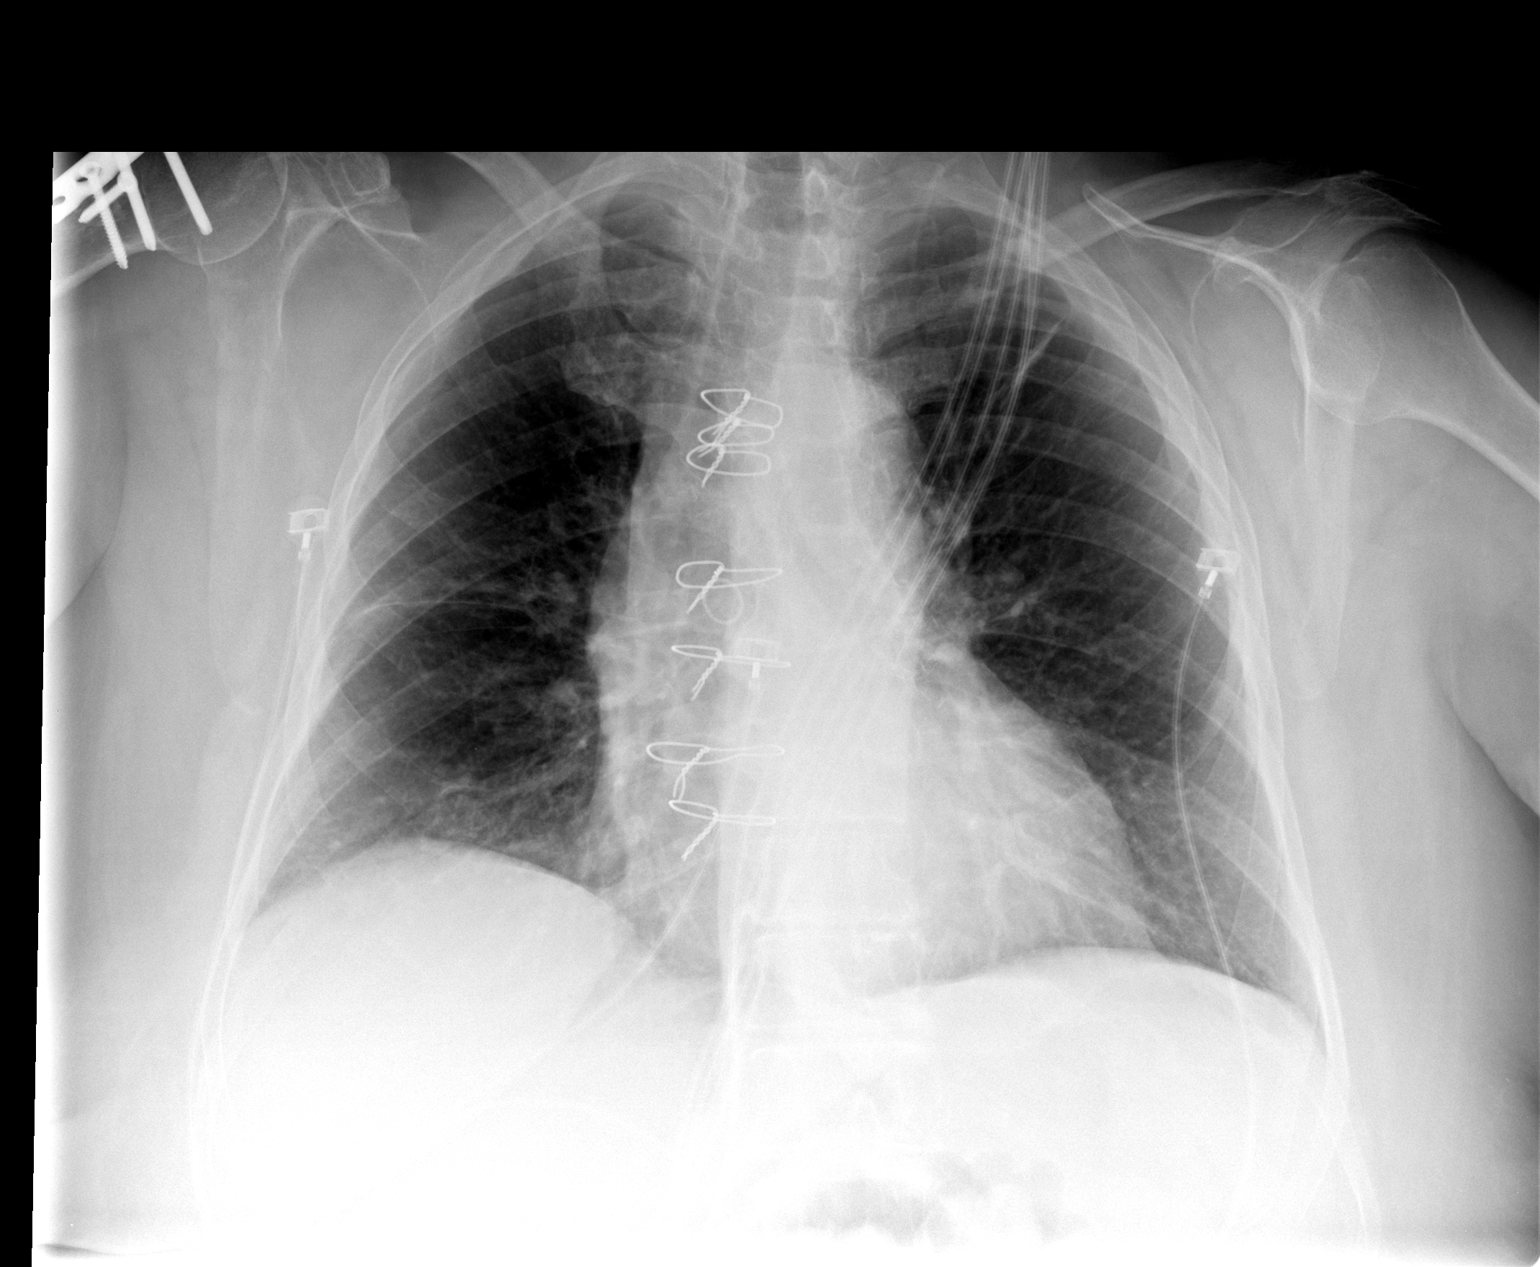

[view not recorded (2 of 2)]
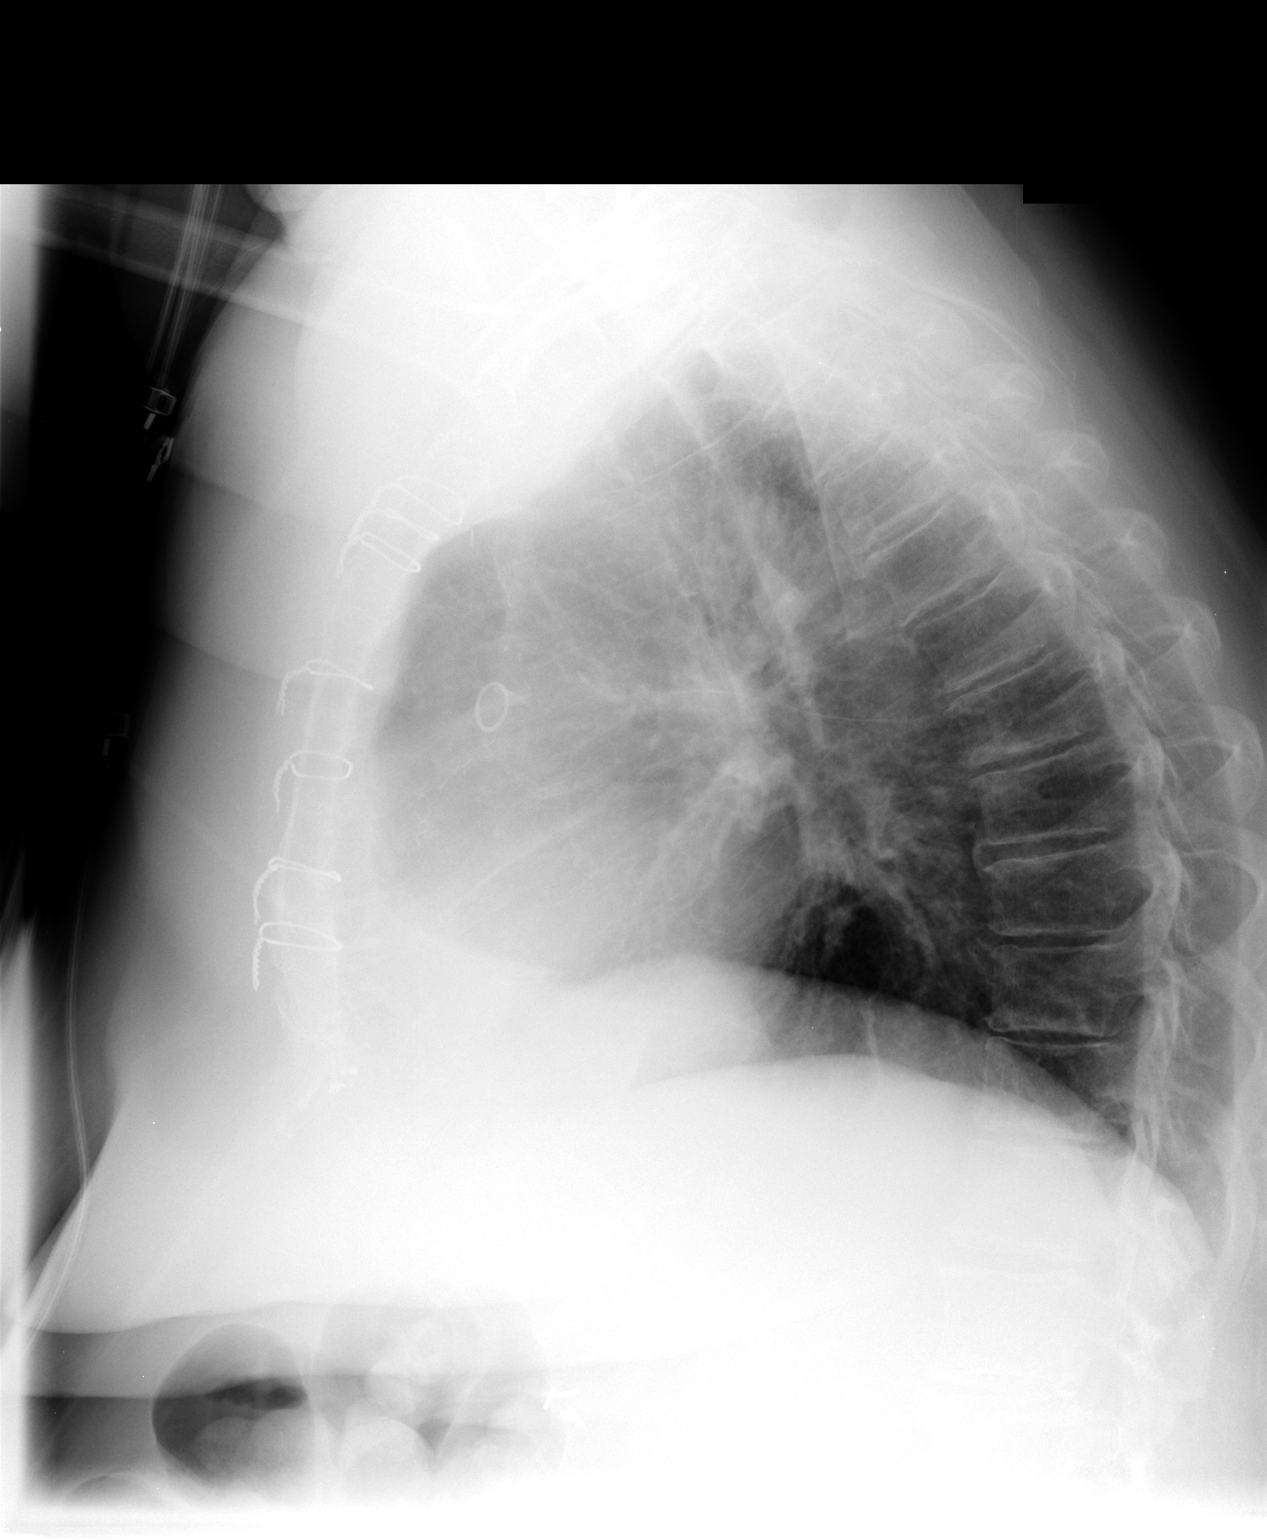

[2 of 2 positions shown; findings below may reference images not displayed]

FINDINGS: Grossly unchanged borderline enlarged cardiac silhouette and
mediastinal contours post median sternotomy and CABG. Mild
cephalization of flow without frank evidence of edema. No discrete
focal airspace opacities. No pleural effusion or pneumothorax. No
evidence of edema. There is mild elevation / eventration of the
right hemidiaphragm. Unchanged bones including sequela of
right-sided rotator cuff repair and sideplate fixation of the
proximal right humeral metadiaphysis, incompletely imaged. Post
cholecystectomy.
IMPRESSION: Mild pulmonary venous congestion without frank evidence of edema.

## 2016-09-15 IMAGING — CR DG CHEST 2V
2 series · 2 of 2 positions shown · non-contrast
Comparison: Chest radiograph 08/28/2014

CLINICAL DATA: Cough, congestion

EXAM:
CHEST  2 VIEW

[view not recorded (1 of 2)]
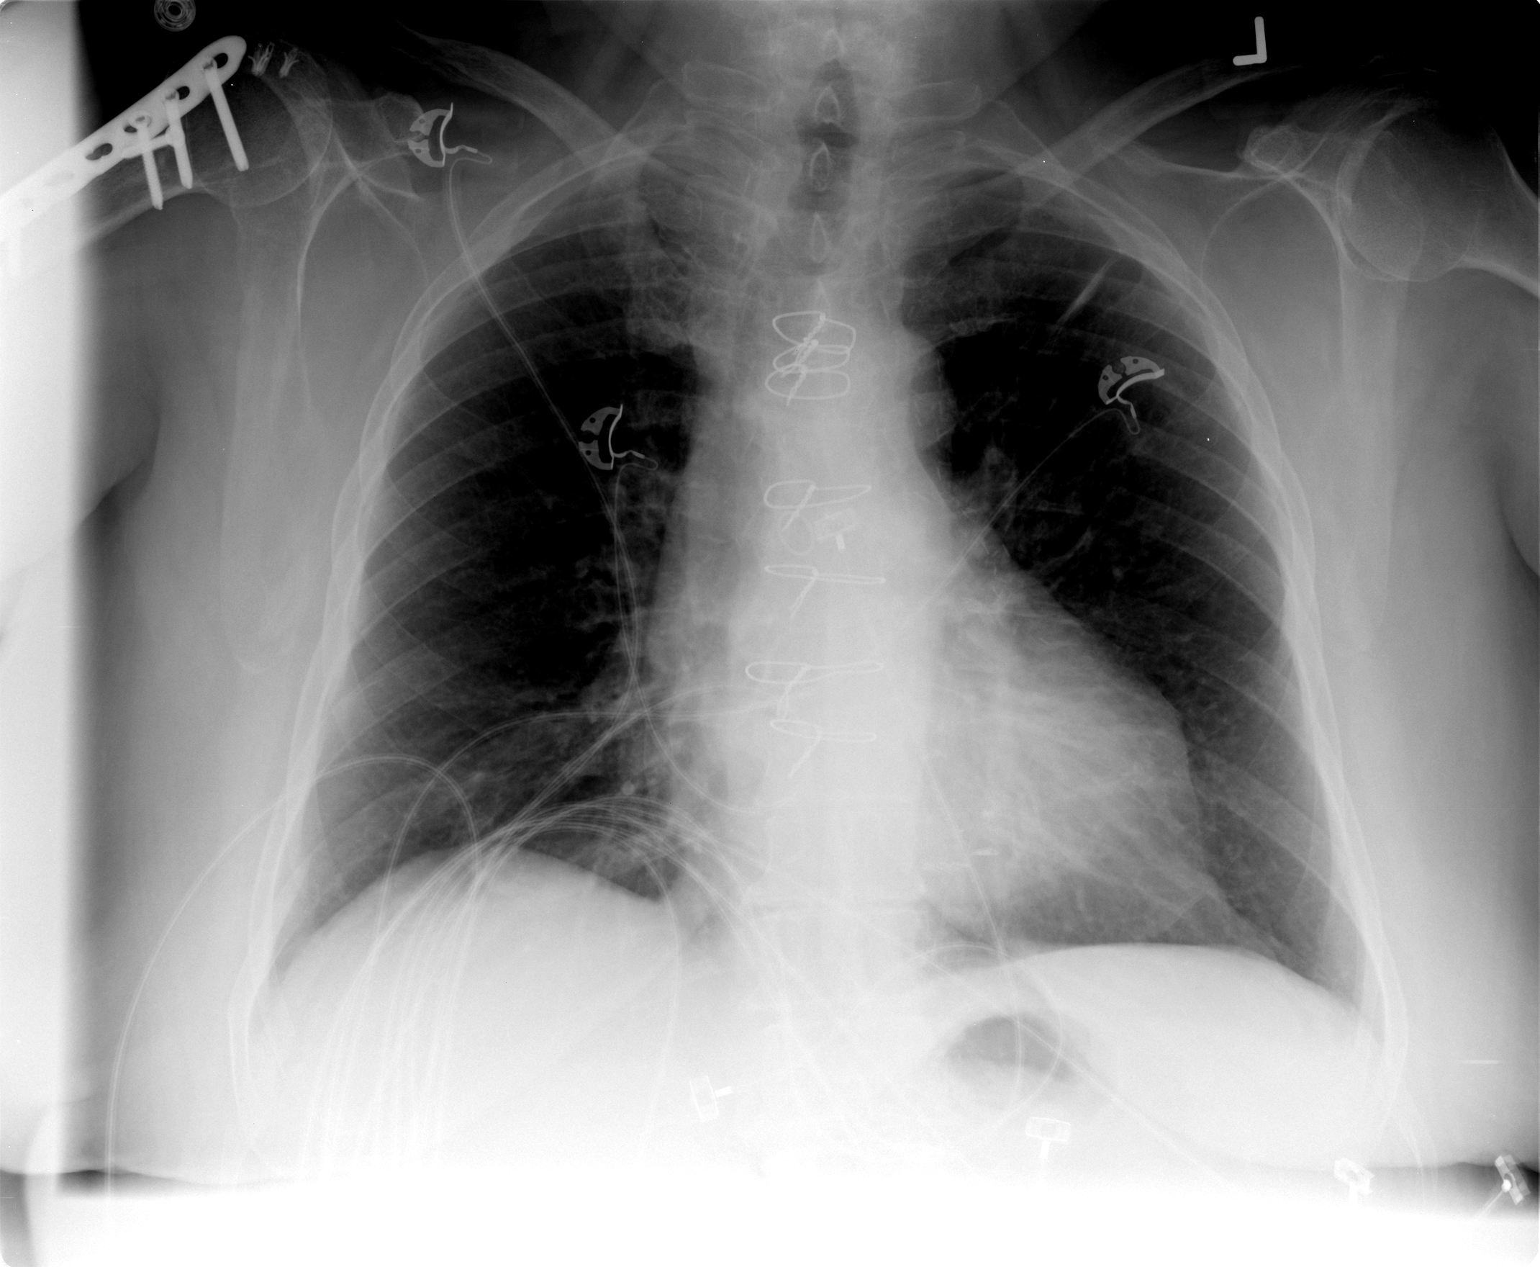

[view not recorded (2 of 2)]
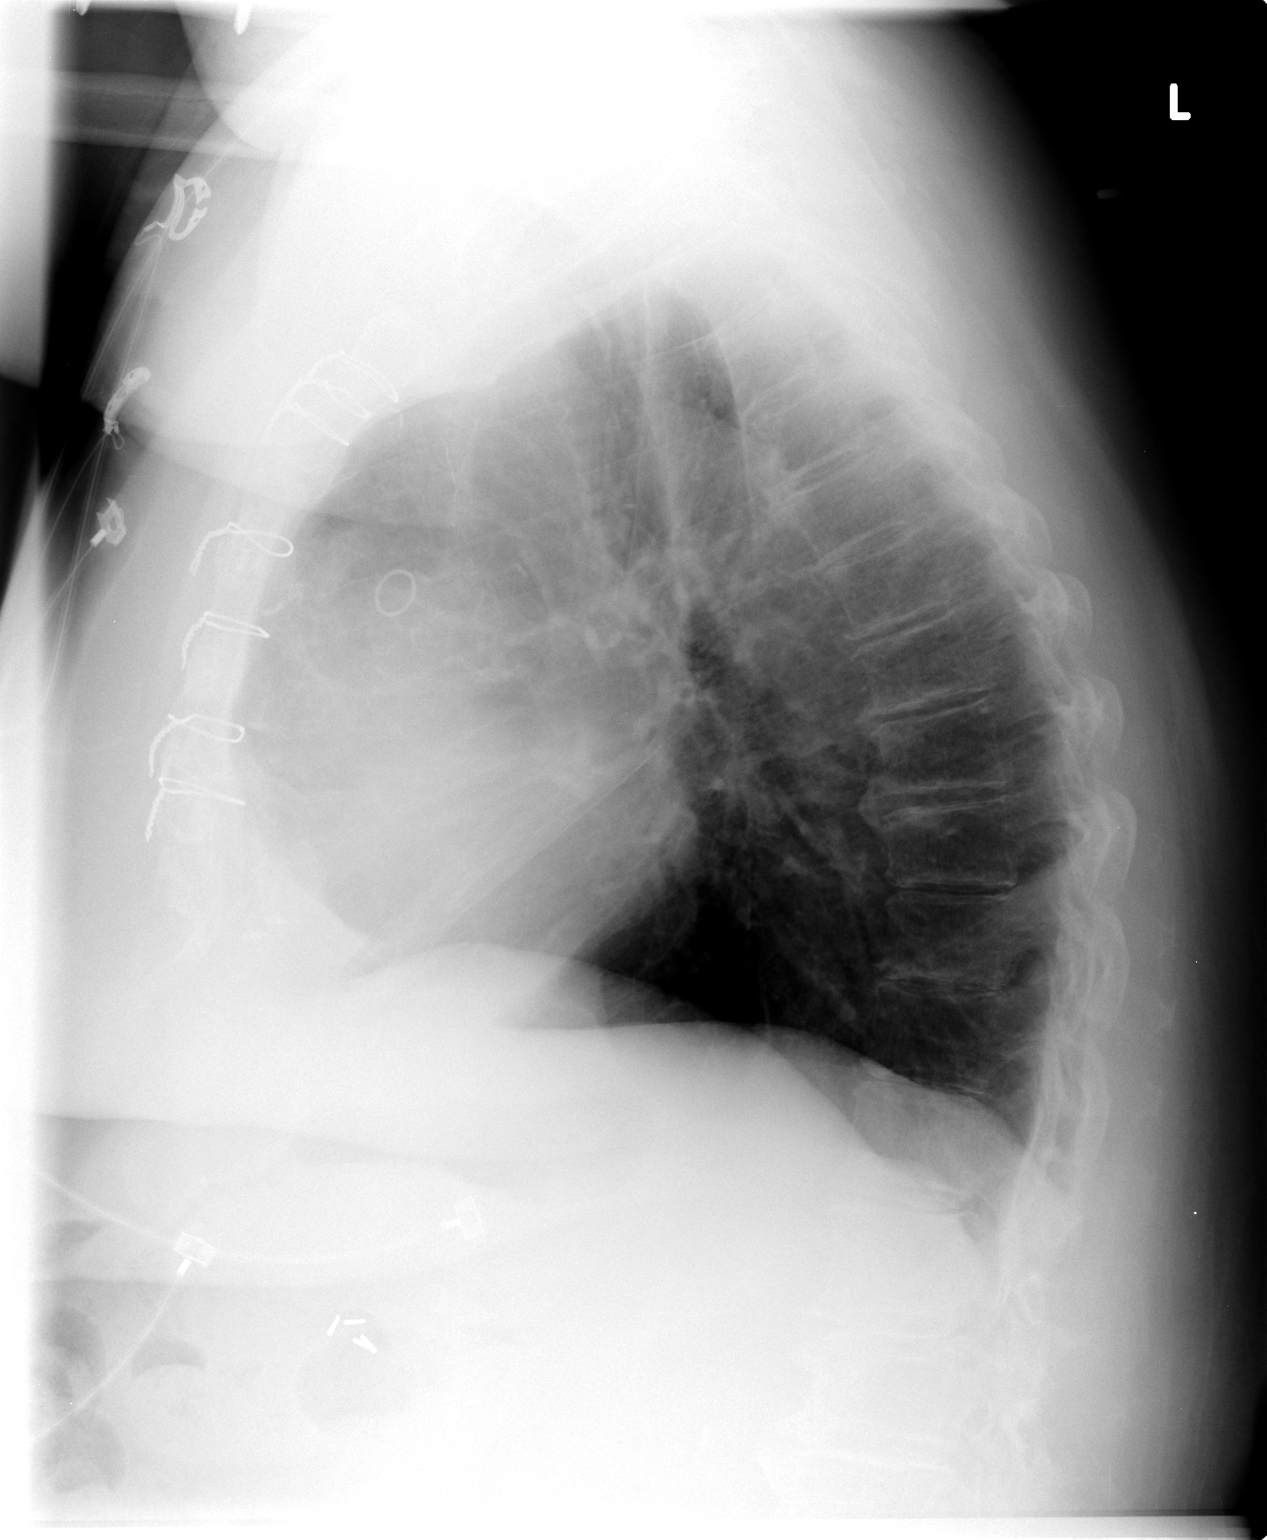

[2 of 2 positions shown; findings below may reference images not displayed]

FINDINGS: Stable cardiac and mediastinal contours status post median
sternotomy. No consolidative pulmonary opacities. No pleural
effusion or pneumothorax. Postsurgical change proximal right
humerus. Mid thoracic spine degenerative change.
IMPRESSION: No acute cardiopulmonary process.

## 2016-09-19 ENCOUNTER — Emergency Department (HOSPITAL_COMMUNITY): Payer: Medicare HMO

## 2016-09-19 ENCOUNTER — Encounter (HOSPITAL_COMMUNITY): Payer: Self-pay | Admitting: Emergency Medicine

## 2016-09-19 ENCOUNTER — Observation Stay (HOSPITAL_COMMUNITY)
Admission: EM | Admit: 2016-09-19 | Discharge: 2016-09-22 | Disposition: A | Discharge status: Home / Self Care | Payer: Medicare HMO | Attending: Family Medicine | Admitting: Family Medicine

## 2016-09-19 DIAGNOSIS — E039 Hypothyroidism, unspecified: Secondary | ICD-10-CM | POA: Diagnosis not present

## 2016-09-19 DIAGNOSIS — J441 Chronic obstructive pulmonary disease with (acute) exacerbation: Secondary | ICD-10-CM | POA: Diagnosis not present

## 2016-09-19 DIAGNOSIS — D649 Anemia, unspecified: Secondary | ICD-10-CM | POA: Diagnosis not present

## 2016-09-19 DIAGNOSIS — Z794 Long term (current) use of insulin: Secondary | ICD-10-CM | POA: Diagnosis not present

## 2016-09-19 DIAGNOSIS — I5030 Unspecified diastolic (congestive) heart failure: Secondary | ICD-10-CM | POA: Diagnosis not present

## 2016-09-19 DIAGNOSIS — E031 Congenital hypothyroidism without goiter: Secondary | ICD-10-CM | POA: Diagnosis not present

## 2016-09-19 DIAGNOSIS — I110 Hypertensive heart disease with heart failure: Secondary | ICD-10-CM | POA: Insufficient documentation

## 2016-09-19 DIAGNOSIS — E782 Mixed hyperlipidemia: Secondary | ICD-10-CM

## 2016-09-19 DIAGNOSIS — R079 Chest pain, unspecified: Secondary | ICD-10-CM

## 2016-09-19 DIAGNOSIS — R0789 Other chest pain: Secondary | ICD-10-CM | POA: Diagnosis present

## 2016-09-19 DIAGNOSIS — L409 Psoriasis, unspecified: Secondary | ICD-10-CM

## 2016-09-19 DIAGNOSIS — Z7982 Long term (current) use of aspirin: Secondary | ICD-10-CM | POA: Diagnosis not present

## 2016-09-19 DIAGNOSIS — E119 Type 2 diabetes mellitus without complications: Secondary | ICD-10-CM | POA: Diagnosis not present

## 2016-09-19 DIAGNOSIS — E118 Type 2 diabetes mellitus with unspecified complications: Secondary | ICD-10-CM | POA: Diagnosis present

## 2016-09-19 DIAGNOSIS — Z91199 Patient's noncompliance with other medical treatment and regimen due to unspecified reason: Secondary | ICD-10-CM

## 2016-09-19 DIAGNOSIS — D638 Anemia in other chronic diseases classified elsewhere: Secondary | ICD-10-CM | POA: Diagnosis not present

## 2016-09-19 DIAGNOSIS — Z7984 Long term (current) use of oral hypoglycemic drugs: Secondary | ICD-10-CM | POA: Insufficient documentation

## 2016-09-19 DIAGNOSIS — I679 Cerebrovascular disease, unspecified: Secondary | ICD-10-CM | POA: Diagnosis not present

## 2016-09-19 DIAGNOSIS — Z79899 Other long term (current) drug therapy: Secondary | ICD-10-CM | POA: Insufficient documentation

## 2016-09-19 DIAGNOSIS — Z9119 Patient's noncompliance with other medical treatment and regimen: Secondary | ICD-10-CM | POA: Diagnosis not present

## 2016-09-19 DIAGNOSIS — I10 Essential (primary) hypertension: Secondary | ICD-10-CM | POA: Diagnosis present

## 2016-09-19 DIAGNOSIS — N3000 Acute cystitis without hematuria: Secondary | ICD-10-CM

## 2016-09-19 DIAGNOSIS — R202 Paresthesia of skin: Secondary | ICD-10-CM

## 2016-09-19 DIAGNOSIS — I208 Other forms of angina pectoris: Secondary | ICD-10-CM | POA: Insufficient documentation

## 2016-09-19 DIAGNOSIS — I2089 Other forms of angina pectoris: Secondary | ICD-10-CM

## 2016-09-19 DIAGNOSIS — R531 Weakness: Secondary | ICD-10-CM

## 2016-09-19 DIAGNOSIS — Z6833 Body mass index (BMI) 33.0-33.9, adult: Secondary | ICD-10-CM | POA: Insufficient documentation

## 2016-09-19 DIAGNOSIS — R072 Precordial pain: Secondary | ICD-10-CM

## 2016-09-19 DIAGNOSIS — G450 Vertebro-basilar artery syndrome: Secondary | ICD-10-CM

## 2016-09-19 DIAGNOSIS — J449 Chronic obstructive pulmonary disease, unspecified: Secondary | ICD-10-CM | POA: Diagnosis present

## 2016-09-19 HISTORY — DX: Atherosclerotic heart disease of native coronary artery without angina pectoris: I25.10

## 2016-09-19 HISTORY — DX: Essential (primary) hypertension: I10

## 2016-09-19 LAB — I-STAT CHEM 8, ED
BUN: 6 mg/dL (ref 6–20)
CREATININE: 0.8 mg/dL (ref 0.44–1.00)
Calcium, Ion: 1.2 mmol/L (ref 1.15–1.40)
Chloride: 104 mmol/L (ref 101–111)
Glucose, Bld: 121 mg/dL — ABNORMAL HIGH (ref 65–99)
HEMATOCRIT: 28 % — AB (ref 36.0–46.0)
HEMOGLOBIN: 9.5 g/dL — AB (ref 12.0–15.0)
POTASSIUM: 3.9 mmol/L (ref 3.5–5.1)
Sodium: 142 mmol/L (ref 135–145)
TCO2: 28 mmol/L (ref 0–100)

## 2016-09-19 LAB — COMPREHENSIVE METABOLIC PANEL
ALT: 13 U/L — ABNORMAL LOW (ref 14–54)
AST: 16 U/L (ref 15–41)
Albumin: 3.5 g/dL (ref 3.5–5.0)
Alkaline Phosphatase: 61 U/L (ref 38–126)
Anion gap: 7 (ref 5–15)
BILIRUBIN TOTAL: 0.4 mg/dL (ref 0.3–1.2)
BUN: 8 mg/dL (ref 6–20)
CHLORIDE: 106 mmol/L (ref 101–111)
CO2: 27 mmol/L (ref 22–32)
Calcium: 9.1 mg/dL (ref 8.9–10.3)
Creatinine, Ser: 0.75 mg/dL (ref 0.44–1.00)
Glucose, Bld: 125 mg/dL — ABNORMAL HIGH (ref 65–99)
POTASSIUM: 3.8 mmol/L (ref 3.5–5.1)
Sodium: 140 mmol/L (ref 135–145)
TOTAL PROTEIN: 6.3 g/dL — AB (ref 6.5–8.1)

## 2016-09-19 LAB — I-STAT TROPONIN, ED
TROPONIN I, POC: 0.02 ng/mL (ref 0.00–0.08)
TROPONIN I, POC: 0 ng/mL (ref 0.00–0.08)

## 2016-09-19 LAB — CBC
HCT: 28 % — ABNORMAL LOW (ref 36.0–46.0)
HEMOGLOBIN: 7.5 g/dL — AB (ref 12.0–15.0)
MCH: 17 pg — AB (ref 26.0–34.0)
MCHC: 26.8 g/dL — AB (ref 30.0–36.0)
MCV: 63.3 fL — ABNORMAL LOW (ref 78.0–100.0)
PLATELETS: 331 10*3/uL (ref 150–400)
RBC: 4.42 MIL/uL (ref 3.87–5.11)
RDW: 19.3 % — AB (ref 11.5–15.5)
WBC: 9.9 10*3/uL (ref 4.0–10.5)

## 2016-09-19 LAB — RAPID URINE DRUG SCREEN, HOSP PERFORMED
Amphetamines: NOT DETECTED
BARBITURATES: NOT DETECTED
BENZODIAZEPINES: NOT DETECTED
COCAINE: NOT DETECTED
Opiates: NOT DETECTED
Tetrahydrocannabinol: NOT DETECTED

## 2016-09-19 LAB — ETHANOL: Alcohol, Ethyl (B): <5 mg/dL (ref <5)

## 2016-09-19 LAB — DIFFERENTIAL
BASOS ABS: 0 10*3/uL (ref 0.0–0.1)
BASOS PCT: 0 %
EOS ABS: 0.4 10*3/uL (ref 0.0–0.7)
EOS PCT: 4 %
LYMPHS ABS: 2.8 10*3/uL (ref 0.7–4.0)
Lymphocytes Relative: 28 %
MONO ABS: 0.9 10*3/uL (ref 0.1–1.0)
Monocytes Relative: 9 %
NEUTROS ABS: 5.8 10*3/uL (ref 1.7–7.7)
NEUTROS PCT: 59 %

## 2016-09-19 LAB — URINE MICROSCOPIC-ADD ON: RBC / HPF: NONE SEEN RBC/hpf (ref 0–5)

## 2016-09-19 LAB — TROPONIN I

## 2016-09-19 LAB — PREPARE RBC (CROSSMATCH)

## 2016-09-19 LAB — APTT: APTT: 31 s (ref 24–36)

## 2016-09-19 LAB — URINALYSIS, ROUTINE W REFLEX MICROSCOPIC
Bilirubin Urine: NEGATIVE
GLUCOSE, UA: 250 mg/dL — AB
HGB URINE DIPSTICK: NEGATIVE
Ketones, ur: NEGATIVE mg/dL
Nitrite: POSITIVE — AB
pH: 5.5 (ref 5.0–8.0)

## 2016-09-19 LAB — GLUCOSE, CAPILLARY: GLUCOSE-CAPILLARY: 176 mg/dL — AB (ref 65–99)

## 2016-09-19 LAB — CBG MONITORING, ED: Glucose-Capillary: 142 mg/dL — ABNORMAL HIGH (ref 65–99)

## 2016-09-19 LAB — PROTIME-INR
INR: 0.96
PROTHROMBIN TIME: 12.8 s (ref 11.4–15.2)

## 2016-09-19 LAB — ABO/RH: ABO/RH(D): O POS

## 2016-09-19 LAB — POC OCCULT BLOOD, ED: Fecal Occult Bld: NEGATIVE

## 2016-09-19 MED ORDER — SENNOSIDES-DOCUSATE SODIUM 8.6-50 MG PO TABS: 1.0000 | ORAL_TABLET | Freq: Every evening | ORAL | Status: DC | PRN

## 2016-09-19 MED ORDER — DEXTROSE 5 % IV SOLN
INTRAVENOUS | Status: AC
  Filled 2016-09-19: qty 10

## 2016-09-19 MED ORDER — TRAZODONE HCL 50 MG PO TABS: 50.0000 mg | ORAL_TABLET | Freq: Every evening | ORAL | Status: DC | PRN

## 2016-09-19 MED ORDER — OXYCODONE HCL 5 MG PO TABS
5.0000 mg | ORAL_TABLET | ORAL | Status: DC | PRN
  Administered 2016-09-19 – 2016-09-22 (×7): 5 mg via ORAL
  Filled 2016-09-19 (×7): qty 1

## 2016-09-19 MED ORDER — ACETAMINOPHEN 325 MG PO TABS
650.0000 mg | ORAL_TABLET | Freq: Four times a day (QID) | ORAL | Status: DC | PRN
  Administered 2016-09-22 (×2): 650 mg via ORAL
  Filled 2016-09-19 (×2): qty 2

## 2016-09-19 MED ORDER — NITROGLYCERIN 2 % TD OINT
1.0000 [in_us] | TOPICAL_OINTMENT | Freq: Once | TRANSDERMAL | Status: AC
  Administered 2016-09-19: 1 [in_us] via TOPICAL
  Filled 2016-09-19: qty 1

## 2016-09-19 MED ORDER — ONDANSETRON HCL 4 MG/2ML IJ SOLN: 4.0000 mg | Freq: Four times a day (QID) | INTRAMUSCULAR | Status: DC | PRN

## 2016-09-19 MED ORDER — SODIUM CHLORIDE 0.9 % IV SOLN
INTRAVENOUS | Status: DC
  Administered 2016-09-19 – 2016-09-22 (×3): via INTRAVENOUS

## 2016-09-19 MED ORDER — SODIUM CHLORIDE 0.9 % IV SOLN
Freq: Once | INTRAVENOUS | Status: AC
  Administered 2016-09-19: 20:00:00 via INTRAVENOUS

## 2016-09-19 MED ORDER — INFLUENZA VAC SPLIT QUAD 0.5 ML IM SUSY
0.5000 mL | PREFILLED_SYRINGE | INTRAMUSCULAR | Status: AC
  Administered 2016-09-20: 0.5 mL via INTRAMUSCULAR
  Filled 2016-09-19: qty 0.5

## 2016-09-19 MED ORDER — ONDANSETRON HCL 4 MG/2ML IJ SOLN
4.0000 mg | Freq: Once | INTRAMUSCULAR | Status: AC
  Administered 2016-09-19: 4 mg via INTRAVENOUS

## 2016-09-19 MED ORDER — INSULIN ASPART PROT & ASPART (70-30 MIX) 100 UNIT/ML ~~LOC~~ SUSP
20.0000 [IU] | Freq: Two times a day (BID) | SUBCUTANEOUS | Status: DC
  Administered 2016-09-20 – 2016-09-22 (×4): 20 [IU] via SUBCUTANEOUS
  Filled 2016-09-19: qty 10

## 2016-09-19 MED ORDER — ADULT MULTIVITAMIN W/MINERALS CH
1.0000 | ORAL_TABLET | Freq: Every day | ORAL | Status: DC
  Administered 2016-09-20 – 2016-09-22 (×3): 1 via ORAL
  Filled 2016-09-19 (×3): qty 1

## 2016-09-19 MED ORDER — LORAZEPAM 1 MG PO TABS
2.0000 mg | ORAL_TABLET | Freq: Every day | ORAL | Status: DC
  Administered 2016-09-20 – 2016-09-21 (×3): 2 mg via ORAL
  Filled 2016-09-19 (×3): qty 2

## 2016-09-19 MED ORDER — FUROSEMIDE 10 MG/ML IJ SOLN
40.0000 mg | Freq: Once | INTRAMUSCULAR | Status: AC
  Administered 2016-09-20: 40 mg via INTRAVENOUS
  Filled 2016-09-19: qty 4

## 2016-09-19 MED ORDER — ASPIRIN EC 325 MG PO TBEC
325.0000 mg | DELAYED_RELEASE_TABLET | Freq: Every day | ORAL | Status: DC
  Administered 2016-09-20 – 2016-09-22 (×3): 325 mg via ORAL
  Filled 2016-09-19 (×3): qty 1

## 2016-09-19 MED ORDER — METFORMIN HCL 500 MG PO TABS
500.0000 mg | ORAL_TABLET | Freq: Two times a day (BID) | ORAL | Status: DC
  Administered 2016-09-20 – 2016-09-22 (×5): 500 mg via ORAL
  Filled 2016-09-19 (×4): qty 1

## 2016-09-19 MED ORDER — SENNA 8.6 MG PO TABS
1.0000 | ORAL_TABLET | Freq: Two times a day (BID) | ORAL | Status: DC
  Administered 2016-09-20 – 2016-09-22 (×5): 8.6 mg via ORAL
  Filled 2016-09-19 (×5): qty 1

## 2016-09-19 MED ORDER — NITROGLYCERIN 0.4 MG SL SUBL: 0.4000 mg | SUBLINGUAL_TABLET | SUBLINGUAL | Status: DC | PRN

## 2016-09-19 MED ORDER — ACETAMINOPHEN 650 MG RE SUPP: 650.0000 mg | Freq: Four times a day (QID) | RECTAL | Status: DC | PRN

## 2016-09-19 MED ORDER — SODIUM CHLORIDE 0.9% FLUSH: 3.0000 mL | INTRAVENOUS | Status: DC | PRN

## 2016-09-19 MED ORDER — INSULIN ASPART 100 UNIT/ML ~~LOC~~ SOLN
0.0000 [IU] | Freq: Three times a day (TID) | SUBCUTANEOUS | Status: DC
  Administered 2016-09-20: 3 [IU] via SUBCUTANEOUS
  Administered 2016-09-20 – 2016-09-21 (×4): 2 [IU] via SUBCUTANEOUS
  Administered 2016-09-22: 3 [IU] via SUBCUTANEOUS
  Administered 2016-09-22: 1 [IU] via SUBCUTANEOUS

## 2016-09-19 MED ORDER — ALBUTEROL SULFATE (2.5 MG/3ML) 0.083% IN NEBU
2.5000 mg | INHALATION_SOLUTION | RESPIRATORY_TRACT | Status: DC | PRN
  Administered 2016-09-21: 2.5 mg via RESPIRATORY_TRACT
  Filled 2016-09-19: qty 3

## 2016-09-19 MED ORDER — SODIUM CHLORIDE 0.9% FLUSH
3.0000 mL | Freq: Two times a day (BID) | INTRAVENOUS | Status: DC
  Administered 2016-09-19 – 2016-09-22 (×4): 3 mL via INTRAVENOUS

## 2016-09-19 MED ORDER — DEXTROSE 5 % IV SOLN
1.0000 g | INTRAVENOUS | Status: DC
  Administered 2016-09-19 – 2016-09-21 (×3): 1 g via INTRAVENOUS
  Filled 2016-09-19 (×4): qty 10

## 2016-09-19 MED ORDER — ONDANSETRON HCL 4 MG PO TABS: 4.0000 mg | ORAL_TABLET | Freq: Four times a day (QID) | ORAL | Status: DC | PRN

## 2016-09-19 MED ORDER — LISINOPRIL 10 MG PO TABS
10.0000 mg | ORAL_TABLET | Freq: Every morning | ORAL | Status: DC
  Administered 2016-09-20 – 2016-09-22 (×3): 10 mg via ORAL
  Filled 2016-09-19 (×3): qty 1

## 2016-09-19 MED ORDER — PRAVASTATIN SODIUM 10 MG PO TABS
20.0000 mg | ORAL_TABLET | Freq: Every day | ORAL | Status: DC
  Administered 2016-09-19 – 2016-09-21 (×3): 20 mg via ORAL
  Filled 2016-09-19 (×3): qty 2

## 2016-09-19 MED ORDER — POLYETHYLENE GLYCOL 3350 17 G PO PACK: 17.0000 g | PACK | Freq: Every day | ORAL | Status: DC | PRN

## 2016-09-19 MED ORDER — SODIUM CHLORIDE 0.9% FLUSH
3.0000 mL | Freq: Two times a day (BID) | INTRAVENOUS | Status: DC
  Administered 2016-09-19 – 2016-09-22 (×3): 3 mL via INTRAVENOUS

## 2016-09-19 MED ORDER — ONDANSETRON HCL 4 MG/2ML IJ SOLN
INTRAMUSCULAR | Status: AC
  Filled 2016-09-19: qty 2

## 2016-09-19 MED ORDER — LEVOTHYROXINE SODIUM 25 MCG PO TABS
25.0000 ug | ORAL_TABLET | Freq: Every day | ORAL | Status: DC
  Administered 2016-09-20 – 2016-09-22 (×3): 25 ug via ORAL
  Filled 2016-09-19 (×3): qty 1

## 2016-09-19 MED ORDER — SODIUM CHLORIDE 0.9 % IV SOLN: 250.0000 mL | INTRAVENOUS | Status: DC | PRN

## 2016-09-19 MED ORDER — MORPHINE SULFATE (PF) 2 MG/ML IV SOLN
2.0000 mg | Freq: Once | INTRAVENOUS | Status: AC
  Administered 2016-09-19: 2 mg via INTRAVENOUS
  Filled 2016-09-19: qty 1

## 2016-09-19 NOTE — ED Notes (Signed)
Pt given meal tray per dr. Effie ShyWentz.

## 2016-09-19 NOTE — H&P (Signed)
Patient Demographics:    [name] [name], is a 80 y.o. female  MRN: [id number]   DOB - [birth year]  Admit Date - 09/19/2016  Outpatient Primary MD for the patient is [name], MD   Assessment & Plan:    Principal Problem:   Chest pain Active Problems:   DM type 2 causing complication [facility])   Essential hypertension   Chronic obstructive pulmonary disease (HCC)   Anemia of chronic disease   1)Atypical Chest Pain- Patient with history of prior CAD (CABG in 2001, last left heart catheterization in 01/2010 without intervention and with preserved EF over 60%). Chest pain not relieved by nitroglycerin, not exertional.  Place in Observation status on  telemetry monitored unit, check serial troponins and EKG to rule out acute coronary syndrome .  If patient rules out for ACS,  Will need further cardiovascular risk stratification . Cardiology consult to help decide if Stress test is needed in am Versus other diagnostic modalities.   Give aspirin, nitroglycerin, and betablocker.     2)Acute on chronic Anemia- Hemoglobin is 7.5 today, stool Hemoccult is negative, hemoglobin was 10.5 one month ago, patient with some shortness of breath, reports occasional dizziness, given history of CAD, target hemoglobin should be close to 10.  last colonoscopy over 3 years ago, transfuse 2 units of packed blood cells with Lasix in between. Consider follow-up with GI service as outpatient (no evidence of ongoing/active bleeding at this time)  3)HA/Facial Numbness-   transient, now resolved, these symptoms were associated with her chest pain, patient with history of prior CVA,, CT head without acute findings. Consider brain MRI if symptoms reoccur, otherwise continue aspirin and statin drug,  4)UTI- Treat empirically with IV Rocephin  pending cultures  5)DM- continue insulin regimen (Novolin 70/30 give  20 units with meals), Use Novolog/Humalog Sliding scale insulin with Accu-Cheks/Fingersticks as ordered  6)HTN- stable, continue lisinopril 10 mg daily     With History of - Reviewed by me  Past Medical History:  Diagnosis Date  . Arteriosclerotic cardiovascular disease (ASCVD) CARDIOLOGIST-   DR Dietrich Pates    CABG surgery in 2001; nl stress nuclear-2007; angiography in 01/2006- TO of LAD; 70% ostial stenosis      of a small ramus; patent grafts; normal EF; cath in 12/2009 essentially unchanged; EF-66%;  Congestive heart failure with preserved LV systolic function  . Arthritis   . Chronic obstructive pulmonary disease (HCC)    Chronic bronchitis  . DDD (degenerative disc disease), lumbar   . Diastolic CHF (HCC)   . Hematuria   . History of CVA (cerebrovascular accident)    2013--  LACUNAR INFARCTIONS RIGHT THALAMUS---  residual left side of  lip numb  and left eye vision worse (which corrented lens implant from cataract extraction)  . History of MI (myocardial infarction)    07/ 2001   s/p  cabg x2  . Hyperlipidemia   . Hypertension   . Hypothyroidism   .  Insulin dependent type 2 diabetes mellitus (HCC)   . Lesion of bladder   . Lower urinary tract symptoms (LUTS)   . OSA (obstructive sleep apnea)   . Peripheral neuropathy (HCC)   . Psoriasis    lower leg  . S/P CABG x 2    07-02-2000  . SUI (stress urinary incontinence, female)   . Thinning of skin   . Varicose veins       Past Surgical History:  Procedure Laterality Date  . APPENDECTOMY  age 58  . CARDIAC CATHETERIZATION  1991   No sig. cad  . CARDIAC CATHETERIZATION  07-01-2000   high grade in-stent restenosis  . CARDIAC CATHETERIZATION  04-30-2001   single native vessel cad/ graft patent  . CARDIAC CATHETERIZATION  02-13-2006  dr [place] cad with total occluded LAD  and  70% ostial of small ramus/  grafts patent/  normal ef  . CARDIAC  CATHETERIZATION  01-20-2000  dr Clifton James   essentially unchanged;  severe single vessel cad with diffuse disease throughout CFX and RCA/ ef 66%;  chf with preserved LVSF  . CARDIOVASCULAR STRESS TEST  08-28-2011   DR ROTHBART   NEGATIVE NUCLEAR STUDY/  GLOBAL LVSF/  EF 65%  . CARPAL TUNNEL RELEASE Bilateral yrs ago  . CATARACT EXTRACTION W/ INTRAOCULAR LENS  IMPLANT, BILATERAL  2013  . CHOLECYSTECTOMY  1990  . COLONOSCOPY N/A 07/26/2015   Procedure: COLONOSCOPY;  Surgeon: Malissa Hippo, MD;  Location: [facility];  Service: Endoscopy;  Laterality: N/A;  1200  . CORONARY ANGIOPLASTY  11-13-1999   balloon angioplasty to mLAD , d2 of LAD  . CORONARY ANGIOPLASTY WITH STENT PLACEMENT  01-17-2000   PCI stenting to mLAD & D2 of LAD  . CORONARY ARTERY BYPASS GRAFT  07-02-2000   DR Tressie Stalker   SVG to diagonal, LIMA to LAD  . CYSTOSCOPY W/ RETROGRADES Bilateral 08/09/2014   Procedure: CYSTOSCOPY, BILATERAL RETROGRADE, HYDRODISTENSION, BLADDER BIOPSY WITH FULGERATION, INSTILL PYRIDIUM AND MARCAINE ;  Surgeon: Anner Crete, MD;  Location: [facility];  Service: Urology;  Laterality: Bilateral;  . KNEE ARTHROSCOPY Left 10-18-2003   SYNOVECTOMY  . LUMBAR SPINE SURGERY  x2    yrs ago  . ORIF HUMERUS FRACTURE Right 04-18-2008  . REPAIR FLEXOR TENDON HAND Right 09-20-2008   CARPI RADIALIS TENDON TRANSFER TO EXTENSOR TO INDEX, MIDDLE, RING , LITTLE FINGERS AND DIGITI MININI  . ROTATOR CUFF REPAIR Right 1995  . TOTAL ABDOMINAL HYSTERECTOMY W/ BILATERAL SALPINGOOPHORECTOMY  age 37  . TRANSTHORACIC ECHOCARDIOGRAM  12-12-2013   MILD LVH/  EF 60-65%/  GRADE I DIASTOLIC DYSFUNCTION/  MILD LAE      Chief Complaint  Patient presents with  . Chest Pain      HPI:    [name] [name]  is a 80 y.o. female, With past medical history relevant for history of prior stroke, history of CAD with previous CABG in 2001 with subsequent left heart catheterization in February 2011 without intervention  as well as history of diabetes and hypertension who presents to the ED with intermittent retrosternal chest discomfort without significant radiation but associated with some dizziness right facial numbness headache. Patient had some shortness of breath. No leg pains no leg swelling no pleuritic symptoms no productive cough no fever no chills. Husband at bedside questions answered. Patient had nausea without emesis. No palpitations and no near-syncope or syncope.    In ED- patient is found to have chest pain that is  not relieved by nitroglycerin, EKG and initial troponin without significant concerns. Patient is also found to be anemic with hemoglobin is 7.5, stool is Hemoccult- negative. UA suspicious for UTI, patient has some urinary frequency , but patient denies significant dysuria or hematuria and no fevers no chills    Review of systems:    In addition to the HPI above,   A full 12 point Review of 10 Systems was done, except as stated above, all other Review of 10 Systems were negative.    Social History:  Reviewed by me    Social History  Substance Use Topics  . Smoking status: Never Smoker  . Smokeless tobacco: Never Used  . Alcohol use No     Family History :  Reviewed by me    Family History  Problem Relation Age of Onset  . Hypertension Mother   . Stroke Mother   . Coronary artery disease      Multiple first and second-degree relatives  . Aneurysm      Cerebral circulation  . Colon cancer Sister   . Stroke Sister   . Coronary artery disease Sister   . Clotting disorder      Children diagnosed with hypercoagulable state  . Coronary artery disease Brother      Home Medications:   Prior to Admission medications   Medication Sig Start Date End Date Taking? Authorizing Provider  albuterol (PROVENTIL) (2.5 MG/3ML) 0.083% nebulizer solution Take 3 mLs (2.5 mg total) by nebulization every 4 (four) hours as needed for wheezing or shortness of breath. 04/20/16  Yes  Bethann BerkshireJoseph Zammit, MD  aspirin EC 325 MG tablet Take 325 mg by mouth daily.   Yes Historical Provider, MD  Insulin Degludec (TRESIBA FLEXTOUCH) 200 UNIT/ML SOPN Inject 35-70 Units into the skin 2 (two) times daily. 35 units in the morning and 70 units in the evening   Yes Historical Provider, MD  levothyroxine (SYNTHROID, LEVOTHROID) 25 MCG tablet Take 25 mcg by mouth every morning.    Yes Historical Provider, MD  lisinopril (PRINIVIL,ZESTRIL) 10 MG tablet Take 1 tablet by mouth every morning.  11/09/13  Yes Historical Provider, MD  LORazepam (ATIVAN) 2 MG tablet Take 1 tablet by mouth at bedtime. 07/26/16  Yes Historical Provider, MD  metFORMIN (GLUCOPHAGE) 500 MG tablet Take 500 mg by mouth 2 (two) times daily with a meal.     Yes Historical Provider, MD  Multiple Vitamin (MULTIVITAMIN WITH MINERALS) TABS tablet Take 1 tablet by mouth daily.   Yes Historical Provider, MD  nitroGLYCERIN (NITROSTAT) 0.4 MG SL tablet Place 0.4 mg under the tongue every 5 (five) minutes as needed for chest pain.    Yes Historical Provider, MD  NOVOLIN 70/30 RELION (70-30) 100 UNIT/ML injection Inject 20 Units into the skin 3 (three) times daily before meals. 02/25/16  Yes Historical Provider, MD  oxyCODONE-acetaminophen (PERCOCET) 10-325 MG tablet Take 1 tablet by mouth 4 (four) times daily as needed for pain.  07/26/16  Yes Historical Provider, MD  pravastatin (PRAVACHOL) 20 MG tablet Take 20 mg by mouth at bedtime.    Yes Historical Provider, MD  senna-docusate (SENOKOT-S) 8.6-50 MG tablet Take 1 tablet by mouth at bedtime as needed for mild constipation. 08/06/16  Yes Oval Linseyichard [name], MD     Allergies:     Allergies  Allergen Reactions  . Codeine Itching     Physical Exam:   Vitals  Blood pressure (!) 148/57, pulse 62, temperature 99.1 F (37.3 C), temperature source  Oral, resp. rate (!) 21, height 5\' 4"  (1.626 m), weight 91.5 kg (201 lb 11.2 oz), SpO2 99 %.  Physical Examination: General appearance - alert,  well appearing, and in no distress  Mental status - alert, oriented to person, place, and time,  Eyes - sclera anicteric Neck - supple, no JVD elevation , Chest - clear  to auscultation bilaterally, symmetrical air movement,  Heart - S1 and S2 normal, healed CABG scar Abdomen - soft, nontender, nondistended, No CVA tenderness Neurological - screening mental status exam normal, neck supple without rigidity, cranial nerves II through XII intact, DTR's normal and symmetric Extremities - no pedal edema noted, intact peripheral pulses  Skin - warm, dry    Data Review:    CBC  Recent Labs Lab 09/19/16 1447 09/19/16 1451  WBC 9.9  --   HGB 7.5* 9.5*  HCT 28.0* 28.0*  PLT 331  --   MCV 63.3*  --   MCH 17.0*  --   MCHC 26.8*  --   RDW 19.3*  --   LYMPHSABS 2.8  --   MONOABS 0.9  --   EOSABS 0.4  --   BASOSABS 0.0  --    ------------------------------------------------------------------------------------------------------------------  Chemistries   Recent Labs Lab 09/19/16 1447 09/19/16 1451  NA 140 142  K 3.8 3.9  CL 106 104  CO2 27  --   GLUCOSE 125* 121*  BUN 8 6  CREATININE 0.75 0.80  CALCIUM 9.1  --   AST 16  --   ALT 13*  --   ALKPHOS 61  --   BILITOT 0.4  --    ------------------------------------------------------------------------------------------------------------------ estimated creatinine clearance is 63.5 mL/min (by C-G formula based on SCr of 0.8 mg/dL). ------------------------------------------------------------------------------------------------------------------ No results for input(s): TSH, T4TOTAL, T3FREE, THYROIDAB in the last 72 hours.  Invalid input(s): FREET3   Coagulation profile  Recent Labs Lab 09/19/16 1447  INR 0.96   ------------------------------------------------------------------------------------------------------------------- No results for input(s): DDIMER in the last 72  hours. -------------------------------------------------------------------------------------------------------------------  Cardiac Enzymes No results for input(s): CKMB, TROPONINI, MYOGLOBIN in the last 168 hours.  Invalid input(s): CK ------------------------------------------------------------------------------------------------------------------    Component Value Date/Time   BNP 47.0 04/22/2016 0022     ---------------------------------------------------------------------------------------------------------------  Urinalysis    Component Value Date/Time   COLORURINE YELLOW 09/19/2016 1640   APPEARANCEUR CLEAR 09/19/2016 1640   LABSPEC >1.030 (H) 09/19/2016 1640   PHURINE 5.5 09/19/2016 1640   GLUCOSEU 250 (A) 09/19/2016 1640   HGBUR NEGATIVE 09/19/2016 1640   BILIRUBINUR NEGATIVE 09/19/2016 1640   KETONESUR NEGATIVE 09/19/2016 1640   PROTEINUR TRACE (A) 09/19/2016 1640   UROBILINOGEN 0.2 08/29/2015 1842   NITRITE POSITIVE (A) 09/19/2016 1640   LEUKOCYTESUR TRACE (A) 09/19/2016 1640    ----------------------------------------------------------------------------------------------------------------   Imaging Results:    Ct Head Wo Contrast  Result Date: 09/19/2016 CLINICAL DATA:  Right-sided numbness, generalized weakness. EXAM: CT HEAD WITHOUT CONTRAST TECHNIQUE: Contiguous axial images were obtained from the base of the skull through the vertex without intravenous contrast. COMPARISON:  CT scan of August 01, 2016. FINDINGS: Brain: Mild diffuse cortical atrophy is noted. Mild chronic ischemic white matter disease is noted. No mass effect or midline shift is noted. Ventricular size is within normal limits. There is no evidence of mass lesion, hemorrhage or acute infarction. Vascular: No abnormality seen. Skull: Bony calvarium appears intact. Sinuses/Orbits: Visualized paranasal sinuses appear normal. Other: None. IMPRESSION: Mild diffuse cortical atrophy. Mild chronic  ischemic white matter disease. No acute intracranial abnormality seen. Electronically Signed  By: Lupita Raider, M.D.   On: 09/19/2016 15:46   Dg Chest Port 1 View  Result Date: 09/19/2016 CLINICAL DATA:  Chest pain radiating to the right in since 10 a.m. this morning. Nausea and vomiting yesterday. Nonsmoker with history of hypertension and diabetes. EXAM: PORTABLE CHEST 1 VIEW COMPARISON:  07/23/2016 FINDINGS: The cardiac silhouette is mildly enlarged. The patient is status post median sternotomy and CABG. Mild diffuse bilateral interstitial prominence is seen without pneumonic consolidation, CHF nor effusion. Minimal scarring or atelectasis in the left upper lobe as before. Plate and screw fixation of the visualized right humeral neck and shaft with surgical anchors projecting over the right humeral head. No acute fracture nor bone destruction. IMPRESSION: Stable mild cardiomegaly without acute pulmonary disease. Electronically Signed   By: Tollie Eth M.D.   On: 09/19/2016 15:01    Radiological Exams on Admission: Ct Head Wo Contrast  Result Date: 09/19/2016 CLINICAL DATA:  Right-sided numbness, generalized weakness. EXAM: CT HEAD WITHOUT CONTRAST TECHNIQUE: Contiguous axial images were obtained from the base of the skull through the vertex without intravenous contrast. COMPARISON:  CT scan of August 01, 2016. FINDINGS: Brain: Mild diffuse cortical atrophy is noted. Mild chronic ischemic white matter disease is noted. No mass effect or midline shift is noted. Ventricular size is within normal limits. There is no evidence of mass lesion, hemorrhage or acute infarction. Vascular: No abnormality seen. Skull: Bony calvarium appears intact. Sinuses/Orbits: Visualized paranasal sinuses appear normal. Other: None. IMPRESSION: Mild diffuse cortical atrophy. Mild chronic ischemic white matter disease. No acute intracranial abnormality seen. Electronically Signed   By: Lupita Raider, M.D.   On: 09/19/2016  15:46   Dg Chest Port 1 View  Result Date: 09/19/2016 CLINICAL DATA:  Chest pain radiating to the right in since 10 a.m. this morning. Nausea and vomiting yesterday. Nonsmoker with history of hypertension and diabetes. EXAM: PORTABLE CHEST 1 VIEW COMPARISON:  07/23/2016 FINDINGS: The cardiac silhouette is mildly enlarged. The patient is status post median sternotomy and CABG. Mild diffuse bilateral interstitial prominence is seen without pneumonic consolidation, CHF nor effusion. Minimal scarring or atelectasis in the left upper lobe as before. Plate and screw fixation of the visualized right humeral neck and shaft with surgical anchors projecting over the right humeral head. No acute fracture nor bone destruction. IMPRESSION: Stable mild cardiomegaly without acute pulmonary disease. Electronically Signed   By: Tollie Eth M.D.   On: 09/19/2016 15:01    DVT Prophylaxis -SCD   AM Labs Ordered, also please review Full Orders  Family Communication: Admission, patients condition and plan of care including tests being ordered have been discussed with the patient and husband at bedside who indicate understanding and agree with the plan   Code Status - Full Code  Likely DC to  home  Condition   stable  Harvir Patry M.D on 09/19/2016 at 9:14 PM   Between 7am to 7pm - Pager - [phone]  After 7pm go to [email or web address] - password [email or web address]  [facility] - Office  [phone]  Dragon dictation system was used to create this note, attempts have been made to correct errors, however presence of uncorrected errors is not a reflection quality of care provided.

## 2016-09-19 NOTE — ED Notes (Signed)
Admitting doctor at bedside 

## 2016-09-19 NOTE — ED Triage Notes (Signed)
Pt reports cp in center of chest with radiation to right arm starting at 10am this morning, had n/v yesterday.  Pt took 3 nitroglycerin today and 325 asa today.  Pt pain 8/10 at this time.  Pt also has numbness in right arm/hand and right jaw. Pt states her numbness started "after 10am" today.  Pt alert and oriented, noticeable droop on right side, grips are equal at this time.

## 2016-09-19 NOTE — ED Notes (Signed)
Dr. Effie ShyWentz made aware of pt symptoms.

## 2016-09-19 NOTE — ED Notes (Signed)
Attempted report x1. 

## 2016-09-19 NOTE — ED Provider Notes (Signed)
[facility] DEPT Provider Note   CSN: [id number] Arrival date & time: 09/19/16  1357     History   Chief Complaint Chief Complaint  Patient presents with  . Chest Pain    HPI [name] [name] is a 80 y.o. female.  Patient here for evaluation of chest pain which started about 10 AM this morning. She immediately took a nitroglycerin, repeated it and 15 minutes and took a third 15 minutes later. This treatment had no impact on her pain. She continues to have 7/10, umbilical chest pain. Sometime after onset of the chest discomfort, she is not sure, she began to have a numb sensation in her right face. She also states that she has a right-sided headache, which she noticed when she first noticed the numbness. She came in by private vehicle able to ambulate. She states that her right arm feels funny when she moves it. No limitation in movement of the arms or legs. She denies cough, fever, chills, nausea, vomiting, or dizziness. She took her usual medications today. No recent problems with her heart. She is status post CABG. There are no other known modifying factors.  HPI  Past Medical History:  Diagnosis Date  . Arteriosclerotic cardiovascular disease (ASCVD) CARDIOLOGIST-   DR Dietrich PatesOTHBART    CABG surgery in 2001; nl stress nuclear-2007; angiography in 01/2006- TO of LAD; 70% ostial stenosis      of a small ramus; patent grafts; normal EF; cath in 12/2009 essentially unchanged; EF-66%;  Congestive heart failure with preserved LV systolic function  . Arthritis   . Chronic obstructive pulmonary disease (HCC)    Chronic bronchitis  . DDD (degenerative disc disease), lumbar   . Diastolic CHF (HCC)   . Hematuria   . History of CVA (cerebrovascular accident)    2013--  LACUNAR INFARCTIONS RIGHT THALAMUS---  residual left side of  lip numb  and left eye vision worse (which corrented lens implant from cataract extraction)  . History of MI (myocardial infarction)    07/ 2001   s/p  cabg x2  .  Hyperlipidemia   . Hypertension   . Hypothyroidism   . Insulin dependent type 2 diabetes mellitus (HCC)   . Lesion of bladder   . Lower urinary tract symptoms (LUTS)   . OSA (obstructive sleep apnea)   . Peripheral neuropathy (HCC)   . Psoriasis    lower leg  . S/P CABG x 2    07-02-2000  . SUI (stress urinary incontinence, female)   . Thinning of skin   . Varicose veins     Patient Active Problem List   Diagnosis Date Noted  . Stroke (HCC) 08/01/2016  . Dysarthria 03/18/2016  . Influenza-like illness 11/19/2015  . Anemia of chronic disease 11/19/2015  . OSA (obstructive sleep apnea) 11/19/2015  . Left facial numbness 08/29/2015  . Gait instability 06/26/2015  . Bronchitis 11/13/2014  . COPD exacerbation (HCC) 11/13/2014  . UTI (urinary tract infection) 08/26/2014  . Near syncope 08/24/2014  . UTI (lower urinary tract infection) 12/10/2013  . Hypoxia 12/10/2013  . Weakness generalized 12/10/2013  . TIA (transient ischemic attack) 06/22/2012  . Left-sided headache 06/22/2012  . Noncompliance 06/22/2012  . Noncompliance with CPAP treatment 06/22/2012  . Psoriasis 01/07/2012  . Cerebrovascular disease   . Chronic back pain   . Chronic obstructive pulmonary disease (HCC)   . DM type 2 causing complication (HCC) 02/14/2010  . Obesity 02/14/2010  . Arteriosclerotic cardiovascular disease (ASCVD) 02/14/2010  . Hypothyroidism 02/06/2010  .  Hyperlipidemia 02/06/2010  . Essential hypertension 02/06/2010  . Sleep apnea 02/06/2010    Past Surgical History:  Procedure Laterality Date  . APPENDECTOMY  age 20  . CARDIAC CATHETERIZATION  1991   No sig. cad  . CARDIAC CATHETERIZATION  07-01-2000   high grade in-stent restenosis  . CARDIAC CATHETERIZATION  04-30-2001   single native vessel cad/ graft patent  . CARDIAC CATHETERIZATION  02-13-2006  dr [place] cad with total occluded LAD  and  70% ostial of small ramus/  grafts patent/  normal ef  . CARDIAC CATHETERIZATION   01-20-2000  dr Clifton James   essentially unchanged;  severe single vessel cad with diffuse disease throughout CFX and RCA/ ef 66%;  chf with preserved LVSF  . CARDIOVASCULAR STRESS TEST  08-28-2011   DR ROTHBART   NEGATIVE NUCLEAR STUDY/  GLOBAL LVSF/  EF 65%  . CARPAL TUNNEL RELEASE Bilateral yrs ago  . CATARACT EXTRACTION W/ INTRAOCULAR LENS  IMPLANT, BILATERAL  2013  . CHOLECYSTECTOMY  1990  . COLONOSCOPY N/A 07/26/2015   Procedure: COLONOSCOPY;  Surgeon: Malissa Hippo, MD;  [facility]: [facility];  Service: Endoscopy;  Laterality: N/A;  1200  . CORONARY ANGIOPLASTY  11-13-1999   balloon angioplasty to mLAD , d2 of LAD  . CORONARY ANGIOPLASTY WITH STENT PLACEMENT  01-17-2000   PCI stenting to mLAD & D2 of LAD  . CORONARY ARTERY BYPASS GRAFT  07-02-2000   DR Tressie Stalker   SVG to diagonal, LIMA to LAD  . CYSTOSCOPY W/ RETROGRADES Bilateral 08/09/2014   Procedure: CYSTOSCOPY, BILATERAL RETROGRADE, HYDRODISTENSION, BLADDER BIOPSY WITH FULGERATION, INSTILL PYRIDIUM AND MARCAINE ;  Surgeon: Anner Crete, MD;  Location: [facility];  Service: Urology;  Laterality: Bilateral;  . KNEE ARTHROSCOPY Left 10-18-2003   SYNOVECTOMY  . LUMBAR SPINE SURGERY  x2    yrs ago  . ORIF HUMERUS FRACTURE Right 04-18-2008  . REPAIR FLEXOR TENDON HAND Right 09-20-2008   CARPI RADIALIS TENDON TRANSFER TO EXTENSOR TO INDEX, MIDDLE, RING , LITTLE FINGERS AND DIGITI MININI  . ROTATOR CUFF REPAIR Right 1995  . TOTAL ABDOMINAL HYSTERECTOMY W/ BILATERAL SALPINGOOPHORECTOMY  age 22  . TRANSTHORACIC ECHOCARDIOGRAM  12-12-2013   MILD LVH/  EF 60-65%/  GRADE I DIASTOLIC DYSFUNCTION/  MILD LAE    OB History    Gravida Para Term Preterm AB Living   7 7 7     4    SAB TAB Ectopic Multiple Live Births                   Home Medications    Prior to Admission medications   Medication Sig Start Date End Date Taking? Authorizing Provider  albuterol (PROVENTIL) (2.5 MG/3ML) 0.083% nebulizer solution Take  3 mLs (2.5 mg total) by nebulization every 4 (four) hours as needed for wheezing or shortness of breath. 04/20/16  Yes Bethann Berkshire, MD  aspirin EC 325 MG tablet Take 325 mg by mouth daily.   Yes Historical Provider, MD  Insulin Degludec (TRESIBA FLEXTOUCH) 200 UNIT/ML SOPN Inject 35-70 Units into the skin 2 (two) times daily. 35 units in the morning and 70 units in the evening   Yes Historical Provider, MD  levothyroxine (SYNTHROID, LEVOTHROID) 25 MCG tablet Take 25 mcg by mouth every morning.    Yes Historical Provider, MD  lisinopril (PRINIVIL,ZESTRIL) 10 MG tablet Take 1 tablet by mouth every morning.  11/09/13  Yes Historical Provider, MD  LORazepam (ATIVAN) 2 MG tablet Take  1 tablet by mouth at bedtime. 07/26/16  Yes Historical Provider, MD  metFORMIN (GLUCOPHAGE) 500 MG tablet Take 500 mg by mouth 2 (two) times daily with a meal.     Yes Historical Provider, MD  Multiple Vitamin (MULTIVITAMIN WITH MINERALS) TABS tablet Take 1 tablet by mouth daily.   Yes Historical Provider, MD  nitroGLYCERIN (NITROSTAT) 0.4 MG SL tablet Place 0.4 mg under the tongue every 5 (five) minutes as needed for chest pain.    Yes Historical Provider, MD  NOVOLIN 70/30 RELION (70-30) 100 UNIT/ML injection Inject 20 Units into the skin 3 (three) times daily before meals. 02/25/16  Yes Historical Provider, MD  oxyCODONE-acetaminophen (PERCOCET) 10-325 MG tablet Take 1 tablet by mouth 4 (four) times daily as needed for pain.  07/26/16  Yes Historical Provider, MD  pravastatin (PRAVACHOL) 20 MG tablet Take 20 mg by mouth at bedtime.    Yes Historical Provider, MD  senna-docusate (SENOKOT-S) 8.6-50 MG tablet Take 1 tablet by mouth at bedtime as needed for mild constipation. 08/06/16  Yes Oval Linsey, MD    Family History Family History  Problem Relation Age of Onset  . Hypertension Mother   . Stroke Mother   . Coronary artery disease      Multiple first and second-degree relatives  . Aneurysm      Cerebral circulation    . Colon cancer Sister   . Stroke Sister   . Coronary artery disease Sister   . Clotting disorder      Children diagnosed with hypercoagulable state  . Coronary artery disease Brother     Social History Social History  Substance Use Topics  . Smoking status: Never Smoker  . Smokeless tobacco: Never Used  . Alcohol use No     Allergies   Codeine   Review of Systems Review of Systems  All other systems reviewed and are negative.    Physical Exam Updated Vital Signs BP 151/58   Pulse 71   Temp 98.4 F (36.9 C) (Oral)   Resp 16   Ht 5\' 4"  (1.626 m)   Wt 198 lb (89.8 kg)   SpO2 99%   BMI 33.99 kg/m   Physical Exam  Constitutional: She is oriented to person, place, and time. She appears well-developed.  Elderly, overweight  HENT:  Head: Normocephalic and atraumatic.  Eyes: Conjunctivae and EOM are normal. Pupils are equal, round, and reactive to light.  Neck: Normal range of motion and phonation normal. Neck supple.  Cardiovascular: Normal rate and regular rhythm.   Pulmonary/Chest: Effort normal and breath sounds normal. No respiratory distress. She has no wheezes.  Abdominal: Soft. She exhibits no distension. There is no tenderness. There is no guarding.  Musculoskeletal: Normal range of motion.  Neurological: She is alert and oriented to person, place, and time. She exhibits normal muscle tone.  No dysarthria and aphasia or nystagmus. Mild diminished right facial light touch sensation. No altered sensation of the arms, hands or legs, bilaterally. No dysmetria. No pronator drift. No ataxia.  Skin: Skin is warm and dry.  Psychiatric: She has a normal mood and affect. Her behavior is normal. Judgment and thought content normal.  Nursing note and vitals reviewed.    ED Treatments / Results  Labs (all labs ordered are listed, but only abnormal results are displayed) Labs Reviewed  CBC - Abnormal; Notable for the following:       Result Value   Hemoglobin 7.5  (*)    HCT 28.0 (*)  MCV 63.3 (*)    MCH 17.0 (*)    MCHC 26.8 (*)    RDW 19.3 (*)    All other components within normal limits  COMPREHENSIVE METABOLIC PANEL - Abnormal; Notable for the following:    Glucose, Bld 125 (*)    Total Protein 6.3 (*)    ALT 13 (*)    All other components within normal limits  URINALYSIS, ROUTINE W REFLEX MICROSCOPIC (NOT AT [facility]) - Abnormal; Notable for the following:    Specific Gravity, Urine >1.030 (*)    Glucose, UA 250 (*)    Protein, ur TRACE (*)    Nitrite POSITIVE (*)    Leukocytes, UA TRACE (*)    All other components within normal limits  URINE MICROSCOPIC-ADD ON - Abnormal; Notable for the following:    Squamous Epithelial / LPF 0-5 (*)    Bacteria, UA MANY (*)    All other components within normal limits  CBG MONITORING, ED - Abnormal; Notable for the following:    Glucose-Capillary 142 (*)    All other components within normal limits  I-STAT CHEM 8, ED - Abnormal; Notable for the following:    Glucose, Bld 121 (*)    Hemoglobin 9.5 (*)    HCT 28.0 (*)    All other components within normal limits  ETHANOL  PROTIME-INR  APTT  DIFFERENTIAL  URINE RAPID DRUG SCREEN, HOSP PERFORMED  I-STAT TROPOININ, ED  I-STAT TROPOININ, ED  POC OCCULT BLOOD, ED    EKG  EKG Interpretation  Date/Time:  Thursday September 19 2016 14:06:45 EDT Ventricular Rate:  70 PR Interval:    QRS Duration: 101 QT Interval:  412 QTC Calculation: 445 R Axis:   -47 Text Interpretation:  Sinus rhythm Left anterior fascicular block Anteroseptal infarct, old Borderline repolarization abnormality since last tracing no significant change Confirmed by Effie Shy  MD, Amaiya Scruton [id number]) on 09/19/2016 2:10:45 PM       Radiology Ct Head Wo Contrast  Result Date: 09/19/2016 CLINICAL DATA:  Right-sided numbness, generalized weakness. EXAM: CT HEAD WITHOUT CONTRAST TECHNIQUE: Contiguous axial images were obtained from the base of the skull through the vertex without  intravenous contrast. COMPARISON:  CT scan of August 01, 2016. FINDINGS: Brain: Mild diffuse cortical atrophy is noted. Mild chronic ischemic white matter disease is noted. No mass effect or midline shift is noted. Ventricular size is within normal limits. There is no evidence of mass lesion, hemorrhage or acute infarction. Vascular: No abnormality seen. Skull: Bony calvarium appears intact. Sinuses/Orbits: Visualized paranasal sinuses appear normal. Other: None. IMPRESSION: Mild diffuse cortical atrophy. Mild chronic ischemic white matter disease. No acute intracranial abnormality seen. Electronically Signed   By: Lupita Raider, M.D.   On: 09/19/2016 15:46   Dg Chest Port 1 View  Result Date: 09/19/2016 CLINICAL DATA:  Chest pain radiating to the right in since 10 a.m. this morning. Nausea and vomiting yesterday. Nonsmoker with history of hypertension and diabetes. EXAM: PORTABLE CHEST 1 VIEW COMPARISON:  07/23/2016 FINDINGS: The cardiac silhouette is mildly enlarged. The patient is status post median sternotomy and CABG. Mild diffuse bilateral interstitial prominence is seen without pneumonic consolidation, CHF nor effusion. Minimal scarring or atelectasis in the left upper lobe as before. Plate and screw fixation of the visualized right humeral neck and shaft with surgical anchors projecting over the right humeral head. No acute fracture nor bone destruction. IMPRESSION: Stable mild cardiomegaly without acute pulmonary disease. Electronically Signed   By: Rene Kocher.D.  On: 09/19/2016 15:01    Procedures Procedures (including critical care time)  Medications Ordered in ED Medications - No data to display   Initial Impression / Assessment and Plan / ED Course  I have reviewed the triage vital signs and the nursing notes.  Pertinent labs & imaging results that were available during my care of the patient were reviewed by me and considered in my medical decision making (see chart for  details).  Clinical Course  Comment By Time  On initial evaluation at 14:08, the patient does not meet criteria for "stroke, or urgent intervention with thrombolysis. Mancel Bale, MD 09/28 1451    Medications - No data to display  Patient Vitals for the past 24 hrs:  BP Temp Temp src Pulse Resp SpO2 Height Weight  09/19/16 1810 151/58 98.4 F (36.9 C) Oral 71 16 99 % - -  09/19/16 1809 - - - 73 24 96 % - -  09/19/16 1726 - 98.4 F (36.9 C) - - - - - -  09/19/16 1700 146/85 - - 68 16 97 % - -  09/19/16 1640 158/64 98.4 F (36.9 C) Oral 71 18 98 % - -  09/19/16 1630 159/65 - - 68 - 98 % - -  09/19/16 1600 154/83 - - 67 - 96 % - -  09/19/16 1515 - - - 71 21 96 % - -  09/19/16 1502 156/66 98.1 F (36.7 C) Oral 68 17 97 % - -  09/19/16 1500 - - - 66 17 96 % - -  09/19/16 1430 141/55 - - 65 19 95 % - -  09/19/16 1415 - - - 81 18 96 % - -  09/19/16 1406 137/56 98.5 F (36.9 C) Oral 72 18 97 % - -  09/19/16 1405 - - - - - - 5\' 4"  (1.626 m) 198 lb (89.8 kg)    6:18 PM Reevaluation with update and discussion. After initial assessment and treatment, an updated evaluation reveals She states that the numbness in her face is almost gone. She denies weakness or paresthesia of the extremities. She has persistent chest pain. However, she appears very comfortable at this time. She denies having blood in stool or dark-colored stool. Rectal exam, done because of low hemoglobin, no stool in the rectum, mucus is Hemoccult negative. Repeat vital signs, consistent with mild hypertension. There's been no progression of neurologic symptoms. NIH score is low. Patient will require admission for further evaluation and management. Abbye Lao L   6:26 PM-Consult complete with Hospitalist. Patient case explained and discussed. He agrees to admit patient for further evaluation and treatment. Call ended at 1832  Final Clinical Impressions(s) / ED Diagnoses   Final diagnoses:  Anemia, unspecified anemia  type  Chest pain, unspecified chest pain type  Paresthesia    Nonspecific chest pain, headache and paresthesias. Doubt ACS or CVA. Incidental anemia, possibly contributing to the presenting complaints. No clear source for bleeding at this time. She states that she had a colonoscopy recently which was normal.  Nursing Notes Reviewed/ Care Coordinated, and agree without changes. Applicable Imaging Reviewed.  Interpretation of Laboratory Data incorporated into ED treatment   Plan: Admit  New Prescriptions New Prescriptions   No medications on file     Mancel Bale, MD 09/19/16 1840

## 2016-09-20 ENCOUNTER — Observation Stay (HOSPITAL_COMMUNITY): Payer: Medicare HMO

## 2016-09-20 ENCOUNTER — Encounter (HOSPITAL_COMMUNITY): Payer: Self-pay | Admitting: Cardiology

## 2016-09-20 DIAGNOSIS — I25119 Atherosclerotic heart disease of native coronary artery with unspecified angina pectoris: Secondary | ICD-10-CM

## 2016-09-20 DIAGNOSIS — R072 Precordial pain: Secondary | ICD-10-CM

## 2016-09-20 DIAGNOSIS — E785 Hyperlipidemia, unspecified: Secondary | ICD-10-CM | POA: Diagnosis not present

## 2016-09-20 DIAGNOSIS — R0789 Other chest pain: Secondary | ICD-10-CM | POA: Diagnosis not present

## 2016-09-20 DIAGNOSIS — D638 Anemia in other chronic diseases classified elsewhere: Secondary | ICD-10-CM | POA: Diagnosis not present

## 2016-09-20 LAB — NM MYOCAR MULTI W/SPECT W/WALL MOTION / EF
CHL CUP RESTING HR STRESS: 59 {beats}/min
CSEPPHR: 86 {beats}/min
LV dias vol: 86 mL (ref 46–106)
LVSYSVOL: 21 mL
RATE: 0.45
SDS: 0
SRS: 1
SSS: 1
TID: 1.06

## 2016-09-20 LAB — CBC
HCT: 30.2 % — ABNORMAL LOW (ref 36.0–46.0)
HEMOGLOBIN: 8.2 g/dL — AB (ref 12.0–15.0)
MCH: 17.7 pg — AB (ref 26.0–34.0)
MCHC: 27.2 g/dL — AB (ref 30.0–36.0)
MCV: 65.2 fL — AB (ref 78.0–100.0)
Platelets: 325 10*3/uL (ref 150–400)
RBC: 4.63 MIL/uL (ref 3.87–5.11)
RDW: 20.3 % — ABNORMAL HIGH (ref 11.5–15.5)
WBC: 9.4 10*3/uL (ref 4.0–10.5)

## 2016-09-20 LAB — BASIC METABOLIC PANEL
Anion gap: 6 (ref 5–15)
BUN: 8 mg/dL (ref 6–20)
CALCIUM: 8.6 mg/dL — AB (ref 8.9–10.3)
CHLORIDE: 103 mmol/L (ref 101–111)
CO2: 27 mmol/L (ref 22–32)
CREATININE: 0.59 mg/dL (ref 0.44–1.00)
GFR calc non Af Amer: >60 mL/min (ref >60)
GLUCOSE: 189 mg/dL — AB (ref 65–99)
Potassium: 3.5 mmol/L (ref 3.5–5.1)
Sodium: 136 mmol/L (ref 135–145)

## 2016-09-20 LAB — GLUCOSE, CAPILLARY
GLUCOSE-CAPILLARY: 176 mg/dL — AB (ref 65–99)
GLUCOSE-CAPILLARY: 202 mg/dL — AB (ref 65–99)
GLUCOSE-CAPILLARY: 167 mg/dL — AB (ref 65–99)
Glucose-Capillary: 195 mg/dL — ABNORMAL HIGH (ref 65–99)

## 2016-09-20 LAB — TROPONIN I: Troponin I: <0.03 ng/mL (ref <0.03)

## 2016-09-20 MED ORDER — SODIUM CHLORIDE 0.9% FLUSH
INTRAVENOUS | Status: AC
  Administered 2016-09-20: 10 mL via INTRAVENOUS
  Filled 2016-09-20: qty 10

## 2016-09-20 MED ORDER — TECHNETIUM TC 99M TETROFOSMIN IV KIT
10.0000 | PACK | Freq: Once | INTRAVENOUS | Status: AC | PRN
  Administered 2016-09-20: 10 via INTRAVENOUS

## 2016-09-20 MED ORDER — TECHNETIUM TC 99M TETROFOSMIN IV KIT
30.0000 | PACK | Freq: Once | INTRAVENOUS | Status: AC | PRN
  Administered 2016-09-20: 30 via INTRAVENOUS

## 2016-09-20 MED ORDER — REGADENOSON 0.4 MG/5ML IV SOLN
INTRAVENOUS | Status: AC
  Administered 2016-09-20: 0.4 mg via INTRAVENOUS
  Filled 2016-09-20: qty 5

## 2016-09-20 MED ORDER — LORAZEPAM 0.5 MG PO TABS: 0.5000 mg | ORAL_TABLET | Freq: Four times a day (QID) | ORAL | Status: DC | PRN

## 2016-09-20 MED ORDER — REGADENOSON 0.4 MG/5ML IV SOLN
0.4000 mg | Freq: Once | INTRAVENOUS | Status: AC
  Administered 2016-09-20: 0.4 mg via INTRAVENOUS
  Filled 2016-09-20: qty 5

## 2016-09-20 NOTE — Care Management Obs Status (Signed)
MEDICARE OBSERVATION STATUS NOTIFICATION   Patient Details  Name: [name] AuerRuby J Minnifield MRN: [id number] Date of Birth: 07-23-1936   Medicare Observation Status Notification Given:  Yes    Asjah Rauda, Chrystine OilerSharley Diane, RN 09/20/2016, 12:46 PM

## 2016-09-20 NOTE — Care Management Note (Signed)
Case Management Note  Patient Details  Name: [name] [name] MRN: [id number] Date of Birth: 1936/04/03  Subjective/Objective:    Patient adm from home with CP. She lives with her husband and son, is ind with ADL's. [facility] and [facility] again.                 Action/Plan: Will [facility]. [facility]    Expected Discharge Date:       09/21/2016           Expected Discharge Plan:     In-House Referral:     Discharge planning Services     Post Acute Care Choice:    Choice offered to:     DME Arranged:    DME Agency:     HH Arranged:    HH Agency:     Status of Service:     If discussed at [employer] Meetings, dates discussed:    Additional Comments:  Eileen Kangas, Chrystine OilerSharley Diane, RN 09/20/2016, 12:46 PM

## 2016-09-20 NOTE — Consult Note (Signed)
Requesting provider: Dr. Oval Linsey Consulting cardiologist: Dr. Jonelle Sidle  Reason for consultation: Chest pain  Clinical Summary [name] [name] is a 80 y.o.female former patient of Dr. Dietrich Pates (last seen 2013). She presents to the hospital describing lower midsternal to epigastric discomfort that began within the last 24 hours. Also states that she was having some paresthesias on the right side of her face although these have resolved. She reports a sharp, stabbing discomfort, somewhat different from prior angina, nitroglycerin was not helpful. She was treated with analgesics, sleepy this morning, states that she feels better but tired.  Chart review finds prior ischemic assessment in 2012 with low risk Lexiscan Cardiolite demonstrating no ischemia and LVEF 65%. He has not had interval testing or cardiology follow-up. I reviewed her history including multivessel disease status post CABG in 2001.  She does not report any speech deficits or focal motor weakness. She does have a prior history of stroke in 2013. Head CT done during this hospital stay showed mild diffuse cortical atrophy and chronic ischemic white matter changes but no acute event. Recent carotid Dopplers show 1-49% or RICA stenosis and 50-69% LICA stenosis.   Allergies  Allergen Reactions  . Codeine Itching    Medications Scheduled Medications: . aspirin EC  325 mg Oral Daily  . cefTRIAXone (ROCEPHIN)  IV  1 g Intravenous Q24H  . insulin aspart  0-9 Units Subcutaneous TID WC  . insulin aspart protamine- aspart  20 Units Subcutaneous BID WC  . levothyroxine  25 mcg Oral QAC breakfast  . lisinopril  10 mg Oral q morning - 10a  . LORazepam  2 mg Oral QHS  . metFORMIN  500 mg Oral BID WC  . multivitamin with minerals  1 tablet Oral Daily  . pravastatin  20 mg Oral QHS  . regadenoson  0.4 mg Intravenous Once  . senna  1 tablet Oral BID  . sodium chloride flush  3 mL Intravenous Q12H  . sodium chloride  flush  3 mL Intravenous Q12H    Infusions: . sodium chloride 75 mL/hr at 09/19/16 2115    PRN Medications: sodium chloride, acetaminophen **OR** acetaminophen, albuterol, nitroGLYCERIN, ondansetron **OR** ondansetron (ZOFRAN) IV, oxyCODONE, polyethylene glycol, senna-docusate, sodium chloride flush, traZODone   Past Medical History:  Diagnosis Date  . Arthritis   . CAD (coronary artery disease)    Multivessel status post CABG 2001  . Chronic obstructive pulmonary disease (HCC)   . DDD (degenerative disc disease), lumbar   . Diastolic CHF (HCC)   . Essential hypertension   . Hematuria   . History of CVA (cerebrovascular accident)    2013 - lacunar infarct right thalamus - residual left side of  lip numb  and left eye vision worse (which corrented lens implant from cataract extraction)  . History of MI (myocardial infarction)    July 2001  . Hyperlipidemia   . Hypothyroidism   . Insulin dependent type 2 diabetes mellitus (HCC)   . Lesion of bladder   . Lower urinary tract symptoms (LUTS)   . OSA (obstructive sleep apnea)   . Peripheral neuropathy (HCC)   . Psoriasis   . SUI (stress urinary incontinence, female)   . Varicose veins     Past Surgical History:  Procedure Laterality Date  . APPENDECTOMY  age 12  . CARDIAC CATHETERIZATION  1991   No sig. cad  . CARDIAC CATHETERIZATION  07-01-2000   high grade in-stent restenosis  . CARDIAC CATHETERIZATION  04-30-2001  single native vessel cad/ graft patent  . CARDIAC CATHETERIZATION  02-13-2006  dr Jenness Cornerbrodie   2V cad with total occluded LAD  and  70% ostial of small ramus/  grafts patent/  normal ef  . CARDIAC CATHETERIZATION  01-20-2000  dr Clifton Jamesmcalhany   essentially unchanged;  severe single vessel cad with diffuse disease throughout CFX and RCA/ ef 66%;  chf with preserved LVSF  . CARDIOVASCULAR STRESS TEST  08-28-2011   DR ROTHBART   NEGATIVE NUCLEAR STUDY/  GLOBAL LVSF/  EF 65%  . CARPAL TUNNEL RELEASE Bilateral yrs ago  .  CATARACT EXTRACTION W/ INTRAOCULAR LENS  IMPLANT, BILATERAL  2013  . CHOLECYSTECTOMY  1990  . COLONOSCOPY N/A 07/26/2015   Procedure: COLONOSCOPY;  Surgeon: Malissa HippoNajeeb U Rehman, MD;  Location: [facility];  Service: Endoscopy;  Laterality: N/A;  1200  . CORONARY ANGIOPLASTY  11-13-1999   balloon angioplasty to mLAD , d2 of LAD  . CORONARY ANGIOPLASTY WITH STENT PLACEMENT  01-17-2000   PCI stenting to mLAD & D2 of LAD  . CORONARY ARTERY BYPASS GRAFT  07-02-2000   DR Tressie StalkerLARENCE OWEN   SVG to diagonal, LIMA to LAD  . CYSTOSCOPY W/ RETROGRADES Bilateral 08/09/2014   Procedure: CYSTOSCOPY, BILATERAL RETROGRADE, HYDRODISTENSION, BLADDER BIOPSY WITH FULGERATION, INSTILL PYRIDIUM AND MARCAINE ;  Surgeon: Anner CreteJohn J Wrenn, MD;  [name];  Service: Urology;  Laterality: Bilateral;  . KNEE ARTHROSCOPY Left 10-18-2003   SYNOVECTOMY  . LUMBAR SPINE SURGERY  x2    yrs ago  . ORIF HUMERUS FRACTURE Right 04-18-2008  . REPAIR FLEXOR TENDON HAND Right 09-20-2008   CARPI RADIALIS TENDON TRANSFER TO EXTENSOR TO INDEX, MIDDLE, RING , LITTLE FINGERS AND DIGITI MININI  . ROTATOR CUFF REPAIR Right 1995  . TOTAL ABDOMINAL HYSTERECTOMY W/ BILATERAL SALPINGOOPHORECTOMY  age 80  . TRANSTHORACIC ECHOCARDIOGRAM  12-12-2013   MILD LVH/  EF 60-65%/  GRADE I DIASTOLIC DYSFUNCTION/  MILD LAE    Family History  Problem Relation Age of Onset  . Hypertension Mother   . Stroke Mother   . Coronary artery disease      Multiple first and second-degree relatives  . Aneurysm      Cerebral circulation  . Colon cancer Sister   . Stroke Sister   . Coronary artery disease Sister   . Clotting disorder      Children diagnosed with hypercoagulable state  . Coronary artery disease Brother     Social History [name] [name] reports that she has never smoked. She has never used smokeless tobacco. [name] [name] reports that she does not drink alcohol.  Review of Systems Complete review of systems negative  except as otherwise outlined in the clinical summary and also the following. No reported bleeding problems, no melena or hematochezia. No abdominal pain or dysuria.  Physical Examination Blood pressure (!) 151/57, pulse 60, temperature 98.4 F (36.9 C), temperature source Oral, resp. rate 16, height 5\' 4"  (1.626 m), weight 201 lb 11.2 oz (91.5 kg), SpO2 100 %.  Intake/Output Summary (Last 24 hours) at 09/20/16 1006 Last data filed at 09/20/16 0802  Gross per 24 hour  Intake              627 ml  Output              400 ml  Net              227 ml   Telemetry: Sinus rhythm. Lead artifact noted.  Gen.: Overweight  woman in no distress. HEENT: Conjunctiva and lids normal, oropharynx clear. Neck: Supple, no elevated JVP, soft left carotid bruit, no thyromegaly. Lungs: Diminished breath sounds without wheezing, nonlabored breathing at rest. Cardiac: Regular rate and rhythm, no S3, soft systolic murmur, no pericardial rub. Abdomen: Soft, nontender, bowel sounds present, no guarding or rebound. Extremities: No pitting edema, distal pulses 2+. Skin: Warm and dry. Psoriasis changes on the legs. Musculoskeletal: No kyphosis. Neuropsychiatric: Alert and oriented x3, affect grossly appropriate.   Lab Results  Basic Metabolic Panel:  Recent Labs Lab 09/19/16 1447 09/19/16 1451 09/20/16 0356  NA 140 142 136  K 3.8 3.9 3.5  CL 106 104 103  CO2 27  --  27  GLUCOSE 125* 121* 189*  BUN 8 6 8   CREATININE 0.75 0.80 0.59  CALCIUM 9.1  --  8.6*    Liver Function Tests:  Recent Labs Lab 09/19/16 1447  AST 16  ALT 13*  ALKPHOS 61  BILITOT 0.4  PROT 6.3*  ALBUMIN 3.5    CBC:  Recent Labs Lab 09/19/16 1447 09/19/16 1451 09/20/16 0356  WBC 9.9  --  9.4  NEUTROABS 5.8  --   --   HGB 7.5* 9.5* 8.2*  HCT 28.0* 28.0* 30.2*  MCV 63.3*  --  65.2*  PLT 331  --  325    Cardiac Enzymes:  Recent Labs Lab 09/19/16 2147 09/20/16 0356  TROPONINI <0.03 <0.03    ECG I  personally reviewed the tracing from 09/19/2016 which showed sinus rhythm with left anterior fascicular block, poor R-wave progression rule out old anterior infarct pattern.  Imaging  Echocardiogram 08/02/2016: Study Conclusions  - Left ventricle: The cavity size was normal. Wall thickness was   increased in a pattern of moderate LVH. Systolic function was   normal. The estimated ejection fraction was in the range of 60%   to 65%. Wall motion was normal; there were no regional wall   motion abnormalities. Doppler parameters are consistent with   abnormal left ventricular relaxation (grade 1 diastolic   dysfunction). Doppler parameters are consistent with high   ventricular filling pressure. - Aortic valve: Mildly to moderately calcified annulus. Trileaflet;   mildly thickened, mildly calcified leaflets. There was very mild   stenosis. Peak velocity (S): 204 cm/s. Mean gradient (S): 8 mm   Hg. Valve area (VTI): 1.89 cm^2. Valve area (Vmax): 1.68 cm^2.   Valve area (Vmean): 1.82 cm^2. - Mitral valve: Mildly calcified annulus. - Atrial septum: No defect or patent foramen ovale was identified.  Chest x-ray 09/19/2016: FINDINGS: The cardiac silhouette is mildly enlarged. The patient is status post median sternotomy and CABG. Mild diffuse bilateral interstitial prominence is seen without pneumonic consolidation, CHF nor effusion. Minimal scarring or atelectasis in the left upper lobe as before. Plate and screw fixation of the visualized right humeral neck and shaft with surgical anchors projecting over the right humeral head. No acute fracture nor bone destruction.  IMPRESSION: Stable mild cardiomegaly without acute pulmonary disease.  Impression  1. Chest pain with somewhat atypical features. Patient has a known history of CAD status post CABG in 2001, ECG is nonspecific as outlined and initial troponin I levels are normal. She has not undergone recent ischemic evaluation.  2. CAD  status post previous infarct and CABG in 2001. Last ischemic evaluation was in 2012. Hard medical therapy includes aspirin and lisinopril as well as as needed nitroglycerin. She has not been on beta blocker presumably with low resting heart rate and  COPD.  3. Hyperlipidemia, on Pravachol as an outpatient. Need to resume.  4. Insulin-dependent type 2 diabetes mellitus, followed by Dr. Janna Arch.  5. Prior history of stroke in 2013. Patient did have some right-sided facial paresthesias which have resolved in the last 24 hours. Head CT did not reveal any acute events.  6. Acute on chronic anemia. Hemoglobin 7.5 down from 10.5 in August although looks to be more in the 8-9 range chronically. She does not endorse any obvious bleeding problems.   Recommendations  Patient has been kept nothing by mouth after midnight. We will add on for a Lexiscan Myoview today to reassess ischemic burden. Unless there are high risk features that require further evaluation, would anticipate adjustment in medical therapy. Further workup of facial paresthesias and anemia per primary team.  Jonelle Sidle, M.D., F.A.C.C.

## 2016-09-20 NOTE — Progress Notes (Signed)
    Myoview study is as follows:   No diagnostic ST segment changes to indicate ischemia.  Small, mild intensity, apical lateral defect that is fixed and more prominent on rest imaging which is consistent with soft tissue attenuation. No large ischemic territories noted.  This is a low risk study.  Nuclear stress EF: 76%.  Based on this would recommend medical therapy at this point for CAD management. Currently on aspirin, lisinopril, and Pravachol. Norvasc or Imdur would be a consideration for additional management for both angina and blood pressure. No further cardiac testing is planned at this time. We will sign off.  Jonelle SidleSamuel G. McDowell, M.D., F.A.C.C.

## 2016-09-20 NOTE — Progress Notes (Signed)
Patient admitted with more atypical ischemic chest pain than typical had a negative nuclear scan in 2012 prior revascularization with multiple risk factors amended the patient is loss of 2 children within the last 12 months and is in the grieving process and quite anxious/depressed with some somatic overlay [name] [name] FAO:[id number] DOB: 1936/05/21 DOA: 09/19/2016 PCP: [name], MD   Physical Exam: Blood pressure (!) 151/57, pulse 60, temperature 98.4 F (36.9 C), temperature source Oral, resp. rate 16, height 5\' 4"  (1.626 m), weight 91.5 kg (201 lb 11.2 oz), SpO2 100 %. Lungs clear to A&P no rales wheeze rhonchi heart regular rhythm no murmurs gallops he feels rubs abdomen soft nontender bowel sounds normoactive   Investigations:  No results found for this or any previous visit (from the past 240 hour(s)).   Basic Metabolic Panel:  Recent Labs  69/62/95 1447 09/19/16 1451 09/20/16 0356  NA 140 142 136  K 3.8 3.9 3.5  CL 106 104 103  CO2 27  --  27  GLUCOSE 125* 121* 189*  BUN 8 6 8   CREATININE 0.75 0.80 0.59  CALCIUM 9.1  --  8.6*   Liver Function Tests:  Recent Labs  09/19/16 1447  AST 16  ALT 13*  ALKPHOS 61  BILITOT 0.4  PROT 6.3*  ALBUMIN 3.5     CBC:  Recent Labs  09/19/16 1447 09/19/16 1451 09/20/16 0356  WBC 9.9  --  9.4  NEUTROABS 5.8  --   --   HGB 7.5* 9.5* 8.2*  HCT 28.0* 28.0* 30.2*  MCV 63.3*  --  65.2*  PLT 331  --  325    Ct Head Wo Contrast  Result Date: 09/19/2016 CLINICAL DATA:  Right-sided numbness, generalized weakness. EXAM: CT HEAD WITHOUT CONTRAST TECHNIQUE: Contiguous axial images were obtained from the base of the skull through the vertex without intravenous contrast. COMPARISON:  CT scan of August 01, 2016. FINDINGS: Brain: Mild diffuse cortical atrophy is noted. Mild chronic ischemic white matter disease is noted. No mass effect or midline shift is noted. Ventricular size is within normal limits. There is no  evidence of mass lesion, hemorrhage or acute infarction. Vascular: No abnormality seen. Skull: Bony calvarium appears intact. Sinuses/Orbits: Visualized paranasal sinuses appear normal. Other: None. IMPRESSION: Mild diffuse cortical atrophy. Mild chronic ischemic white matter disease. No acute intracranial abnormality seen. Electronically Signed   By: Lupita Raider, M.D.   On: 09/19/2016 15:46   Dg Chest Port 1 View  Result Date: 09/19/2016 CLINICAL DATA:  Chest pain radiating to the right in since 10 a.m. this morning. Nausea and vomiting yesterday. Nonsmoker with history of hypertension and diabetes. EXAM: PORTABLE CHEST 1 VIEW COMPARISON:  07/23/2016 FINDINGS: The cardiac silhouette is mildly enlarged. The patient is status post median sternotomy and CABG. Mild diffuse bilateral interstitial prominence is seen without pneumonic consolidation, CHF nor effusion. Minimal scarring or atelectasis in the left upper lobe as before. Plate and screw fixation of the visualized right humeral neck and shaft with surgical anchors projecting over the right humeral head. No acute fracture nor bone destruction. IMPRESSION: Stable mild cardiomegaly without acute pulmonary disease. Electronically Signed   By: Tollie Eth M.D.   On: 09/19/2016 15:01      Medications:  Impression:  Principal Problem:   Chest pain Active Problems:   DM type 2 causing complication (HCC)   Essential hypertension   Chronic obstructive pulmonary disease (HCC)   Anemia of chronic disease  Plan: Await result results of cardiac physiologic testing this afternoon  Consultants: Cardiology   Procedures   Antibiotics:        Time spent: 30 minutes   LOS: 0 days   Danahi Reddish M   09/20/2016, 12:41 PM

## 2016-09-21 LAB — GLUCOSE, CAPILLARY
GLUCOSE-CAPILLARY: 164 mg/dL — AB (ref 65–99)
Glucose-Capillary: 176 mg/dL — ABNORMAL HIGH (ref 65–99)
Glucose-Capillary: 243 mg/dL — ABNORMAL HIGH (ref 65–99)
Glucose-Capillary: 148 mg/dL — ABNORMAL HIGH (ref 65–99)

## 2016-09-21 LAB — TYPE AND SCREEN
ABO/RH(D): O POS
ANTIBODY SCREEN: NEGATIVE
UNIT DIVISION: 0
UNIT DIVISION: 0

## 2016-09-21 LAB — FERRITIN: FERRITIN: 6 ng/mL — AB (ref 11–307)

## 2016-09-21 LAB — IRON AND TIBC
IRON: 23 ug/dL — AB (ref 28–170)
Saturation Ratios: 6 % — ABNORMAL LOW (ref 10.4–31.8)
TIBC: 360 ug/dL (ref 250–450)
UIBC: 337 ug/dL

## 2016-09-21 LAB — VITAMIN B12: Vitamin B-12: 162 pg/mL — ABNORMAL LOW (ref 180–914)

## 2016-09-21 LAB — RETICULOCYTES
RBC.: 5.29 MIL/uL — ABNORMAL HIGH (ref 3.87–5.11)
RETIC COUNT ABSOLUTE: 79.4 10*3/uL (ref 19.0–186.0)
Retic Ct Pct: 1.5 % (ref 0.4–3.1)

## 2016-09-21 LAB — FOLATE: FOLATE: 32.5 ng/mL (ref >5.9)

## 2016-09-21 NOTE — Progress Notes (Signed)
Nuclear scan performed yesterday results unavailable to me at present hemoglobin A 0.2 patient had colonoscopy 2-3 years ago she states was "" normal as well as EGD the plan right now is to obtain an anemia panel and stools for occult blood daily 3 await results of cardiology physiologic testing [name] [name] ZOX:[id number] DOB: 30-Oct-1936 DOA: 09/19/2016 PCP: [name], MD   Physical Exam: Blood pressure (!) 139/57, pulse 64, temperature 98.3 F (36.8 C), temperature source Oral, resp. rate 14, height 5\' 4"  (1.626 m), weight 91.5 kg (201 lb 11.2 oz), SpO2 93 %. Lungs clear to A&P no rales wheeze rhonchi heart regular rhythm no murmurs gallops he feels rubs abdomen soft nontender bowel sounds normoactive no guarding or rebound or masses no megaly   Investigations:  Recent Results (from the past 240 hour(s))  Urine culture     Status: Abnormal (Preliminary result)   Collection Time: 09/19/16  4:40 PM  Result Value Ref Range Status   Specimen Description URINE, CLEAN CATCH  Final   Special Requests Normal  Final   Culture >=100,000 COLONIES/mL GRAM NEGATIVE RODS (A)  Final   Report Status PENDING  Incomplete     Basic Metabolic Panel:  Recent Labs  81/19/14 1447 09/19/16 1451 09/20/16 0356  NA 140 142 136  K 3.8 3.9 3.5  CL 106 104 103  CO2 27  --  27  GLUCOSE 125* 121* 189*  BUN 8 6 8   CREATININE 0.75 0.80 0.59  CALCIUM 9.1  --  8.6*   Liver Function Tests:  Recent Labs  09/19/16 1447  AST 16  ALT 13*  ALKPHOS 61  BILITOT 0.4  PROT 6.3*  ALBUMIN 3.5     CBC:  Recent Labs  09/19/16 1447 09/19/16 1451 09/20/16 0356  WBC 9.9  --  9.4  NEUTROABS 5.8  --   --   HGB 7.5* 9.5* 8.2*  HCT 28.0* 28.0* 30.2*  MCV 63.3*  --  65.2*  PLT 331  --  325    Ct Head Wo Contrast  Result Date: 09/19/2016 CLINICAL DATA:  Right-sided numbness, generalized weakness. EXAM: CT HEAD WITHOUT CONTRAST TECHNIQUE: Contiguous axial images were obtained from the base of  the skull through the vertex without intravenous contrast. COMPARISON:  CT scan of August 01, 2016. FINDINGS: Brain: Mild diffuse cortical atrophy is noted. Mild chronic ischemic white matter disease is noted. No mass effect or midline shift is noted. Ventricular size is within normal limits. There is no evidence of mass lesion, hemorrhage or acute infarction. Vascular: No abnormality seen. Skull: Bony calvarium appears intact. Sinuses/Orbits: Visualized paranasal sinuses appear normal. Other: None. IMPRESSION: Mild diffuse cortical atrophy. Mild chronic ischemic white matter disease. No acute intracranial abnormality seen. Electronically Signed   By: Lupita Raider, M.D.   On: 09/19/2016 15:46   Nm Myocar Multi W/spect W/wall Motion / Ef  Result Date: 09/20/2016  No diagnostic ST segment changes to indicate ischemia.  Small, mild intensity, apical lateral defect that is fixed and more prominent on rest imaging which is consistent with soft tissue attenuation. No large ischemic territories noted.  This is a low risk study.  Nuclear stress EF: 76%.    Dg Chest Port 1 View  Result Date: 09/19/2016 CLINICAL DATA:  Chest pain radiating to the right in since 10 a.m. this morning. Nausea and vomiting yesterday. Nonsmoker with history of hypertension and diabetes. EXAM: PORTABLE CHEST 1 VIEW COMPARISON:  07/23/2016 FINDINGS: The cardiac silhouette is mildly enlarged.  The patient is status post median sternotomy and CABG. Mild diffuse bilateral interstitial prominence is seen without pneumonic consolidation, CHF nor effusion. Minimal scarring or atelectasis in the left upper lobe as before. Plate and screw fixation of the visualized right humeral neck and shaft with surgical anchors projecting over the right humeral head. No acute fracture nor bone destruction. IMPRESSION: Stable mild cardiomegaly without acute pulmonary disease. Electronically Signed   By: Tollie Ethavid  Kwon M.D.   On: 09/19/2016 15:01       Medications:   Impression:  Principal Problem:   Chest pain Active Problems:   DM type 2 causing complication (HCC)   Essential hypertension   Chronic obstructive pulmonary disease (HCC)   Anemia of chronic disease     Plan: Anemia panel stool for occult blood daily 3 await results of cardiology ischemic testing  Consultants:    Procedures   Antibiotics:           Time spent: 30 minutes   LOS: 0 days   Kyal Arts M   09/21/2016, 1:06 PM

## 2016-09-22 LAB — URINE CULTURE
Culture: >=100000 — AB
SPECIAL REQUESTS: NORMAL

## 2016-09-22 LAB — GLUCOSE, CAPILLARY
GLUCOSE-CAPILLARY: 209 mg/dL — AB (ref 65–99)
Glucose-Capillary: 150 mg/dL — ABNORMAL HIGH (ref 65–99)

## 2016-09-22 MED ORDER — FERROUS SULFATE 325 (65 FE) MG PO TABS: 325.0000 mg | ORAL_TABLET | Freq: Two times a day (BID) | ORAL | 3 refills | Status: DC

## 2016-09-22 MED ORDER — CYANOCOBALAMIN 1000 MCG/ML IJ SOLN
1000.0000 ug | Freq: Once | INTRAMUSCULAR | Status: AC
  Administered 2016-09-22: 1000 ug via INTRAMUSCULAR
  Filled 2016-09-22: qty 1

## 2016-09-22 MED ORDER — FERROUS SULFATE 325 (65 FE) MG PO TABS: 325.0000 mg | ORAL_TABLET | Freq: Two times a day (BID) | ORAL | Status: DC

## 2016-09-22 NOTE — Discharge Summary (Signed)
Physician Discharge Summary  [name]:[id number] DOB: Jan 19, 1936 DOA: 09/19/2016  PCP: [name], MD  Admit date: 09/19/2016 Discharge date: 09/22/2016   Recommendations for Outpatient Follow-up:  The patient is instructed to take Feosol 325 mg by mouth twice a day as well as to receive B-12 injections of thousand micrograms subcutaneous daily for 5 days then weekly for 4 weeks then every monthly for B-12 deficiency is continue all other prehospital medicines as previously prescribed her nuclear scan for ischemia was essentially negative she has normal systolic function Discharge Diagnoses:  Principal Problem:   Chest pain Active Problems:   DM type 2 causing complication (HCC)   Essential hypertension   Chronic obstructive pulmonary disease (HCC)   Anemia of chronic disease   Discharge Condition: Good  Filed Weights   09/19/16 1405 09/19/16 2029  Weight: 89.8 kg (198 lb) 91.5 kg (201 lb 11.2 oz)    History of present illness:  Patient is 80 year old white female with multiple somatic issues status post CABG revascularization 4 complaints of some retrosternal chest discomfort was admitted troponins were serial negative see in consultation by cardiology who performed a nuclear cardiology scan which was essentially negative for ischemia was noted to have anemia of chronic disease in hospital with concomitant B-12 deficiencies as well as iron deficiency with normal TIBC was elected to give her Feosol 325 by mouth twice a day as an outpatient and B-12 injections as micrograms daily for 5 days which began in hospital and follow-up as an outpatient she was transfused 2 units of packed cells hemoglobin was 9.5 upon day of discharge and she was hemodynamically stable without any evidence of dizziness or vertigo or anginal type chest pain  Hospital Course:  See history of present illness above  Procedures:  Transfused 2 units of  PRbc  Consultations:  Cardiology  Discharge Instructions  Discharge Instructions    Discharge instructions    Complete by:  As directed    Discharge patient    Complete by:  As directed        Medication List    TAKE these medications   albuterol (2.5 MG/3ML) 0.083% nebulizer solution Commonly known as:  PROVENTIL Take 3 mLs (2.5 mg total) by nebulization every 4 (four) hours as needed for wheezing or shortness of breath.   aspirin EC 325 MG tablet Take 325 mg by mouth daily.   ferrous sulfate 325 (65 FE) MG tablet Take 1 tablet (325 mg total) by mouth 2 (two) times daily with a meal.   levothyroxine 25 MCG tablet Commonly known as:  SYNTHROID, LEVOTHROID Take 25 mcg by mouth every morning.   lisinopril 10 MG tablet Commonly known as:  PRINIVIL,ZESTRIL Take 1 tablet by mouth every morning.   LORazepam 2 MG tablet Commonly known as:  ATIVAN Take 1 tablet by mouth at bedtime.   metFORMIN 500 MG tablet Commonly known as:  GLUCOPHAGE Take 500 mg by mouth 2 (two) times daily with a meal.   multivitamin with minerals Tabs tablet Take 1 tablet by mouth daily.   nitroGLYCERIN 0.4 MG SL tablet Commonly known as:  NITROSTAT Place 0.4 mg under the tongue every 5 (five) minutes as needed for chest pain.   NOVOLIN 70/30 RELION (70-30) 100 UNIT/ML injection Generic drug:  insulin NPH-regular Human Inject 20 Units into the skin 3 (three) times daily before meals.   oxyCODONE-acetaminophen 10-325 MG tablet Commonly known as:  PERCOCET Take 1 tablet by mouth 4 (four) times daily  as needed for pain.   pravastatin 20 MG tablet Commonly known as:  PRAVACHOL Take 20 mg by mouth at bedtime.   senna-docusate 8.6-50 MG tablet Commonly known as:  Senokot-S Take 1 tablet by mouth at bedtime as needed for mild constipation.   TRESIBA FLEXTOUCH 200 UNIT/ML Sopn Generic drug:  Insulin Degludec Inject 35-70 Units into the skin 2 (two) times daily. 35 units in the morning and  70 units in the evening      Allergies  Allergen Reactions  . Codeine Itching      The results of significant diagnostics from this hospitalization (including imaging, microbiology, ancillary and laboratory) are listed below for reference.    Significant Diagnostic Studies: Ct Head Wo Contrast  Result Date: 09/19/2016 CLINICAL DATA:  Right-sided numbness, generalized weakness. EXAM: CT HEAD WITHOUT CONTRAST TECHNIQUE: Contiguous axial images were obtained from the base of the skull through the vertex without intravenous contrast. COMPARISON:  CT scan of August 01, 2016. FINDINGS: Brain: Mild diffuse cortical atrophy is noted. Mild chronic ischemic white matter disease is noted. No mass effect or midline shift is noted. Ventricular size is within normal limits. There is no evidence of mass lesion, hemorrhage or acute infarction. Vascular: No abnormality seen. Skull: Bony calvarium appears intact. Sinuses/Orbits: Visualized paranasal sinuses appear normal. Other: None. IMPRESSION: Mild diffuse cortical atrophy. Mild chronic ischemic white matter disease. No acute intracranial abnormality seen. Electronically Signed   By: Lupita Raider, M.D.   On: 09/19/2016 15:46   Nm Myocar Multi W/spect W/wall Motion / Ef  Result Date: 09/20/2016  No diagnostic ST segment changes to indicate ischemia.  Small, mild intensity, apical lateral defect that is fixed and more prominent on rest imaging which is consistent with soft tissue attenuation. No large ischemic territories noted.  This is a low risk study.  Nuclear stress EF: 76%.    Dg Chest Port 1 View  Result Date: 09/19/2016 CLINICAL DATA:  Chest pain radiating to the right in since 10 a.m. this morning. Nausea and vomiting yesterday. Nonsmoker with history of hypertension and diabetes. EXAM: PORTABLE CHEST 1 VIEW COMPARISON:  07/23/2016 FINDINGS: The cardiac silhouette is mildly enlarged. The patient is status post median sternotomy and CABG. Mild  diffuse bilateral interstitial prominence is seen without pneumonic consolidation, CHF nor effusion. Minimal scarring or atelectasis in the left upper lobe as before. Plate and screw fixation of the visualized right humeral neck and shaft with surgical anchors projecting over the right humeral head. No acute fracture nor bone destruction. IMPRESSION: Stable mild cardiomegaly without acute pulmonary disease. Electronically Signed   By: Tollie Eth M.D.   On: 09/19/2016 15:01    Microbiology: Recent Results (from the past 240 hour(s))  Urine culture     Status: Abnormal   Collection Time: 09/19/16  4:40 PM  Result Value Ref Range Status   Specimen Description URINE, CLEAN CATCH  Final   Special Requests Normal  Final   Culture >=100,000 COLONIES/mL ESCHERICHIA COLI (A)  Final   Report Status 09/22/2016 FINAL  Final   Organism ID, Bacteria ESCHERICHIA COLI (A)  Final      Susceptibility   Escherichia coli - MIC*    AMPICILLIN >=32 RESISTANT Resistant     CEFAZOLIN 16 SENSITIVE Sensitive     CEFTRIAXONE <=1 SENSITIVE Sensitive     CIPROFLOXACIN >=4 RESISTANT Resistant     GENTAMICIN <=1 SENSITIVE Sensitive     IMIPENEM <=0.25 SENSITIVE Sensitive     NITROFURANTOIN <=16 SENSITIVE  Sensitive     TRIMETH/SULFA >=320 RESISTANT Resistant     AMPICILLIN/SULBACTAM >=32 RESISTANT Resistant     PIP/TAZO <=4 SENSITIVE Sensitive     Extended ESBL NEGATIVE Sensitive     * >=100,000 COLONIES/mL ESCHERICHIA COLI     Labs: Basic Metabolic Panel:  Recent Labs Lab 09/19/16 1447 09/19/16 1451 09/20/16 0356  NA 140 142 136  K 3.8 3.9 3.5  CL 106 104 103  CO2 27  --  27  GLUCOSE 125* 121* 189*  BUN 8 6 8   CREATININE 0.75 0.80 0.59  CALCIUM 9.1  --  8.6*   Liver Function Tests:  Recent Labs Lab 09/19/16 1447  AST 16  ALT 13*  ALKPHOS 61  BILITOT 0.4  PROT 6.3*  ALBUMIN 3.5   No results for input(s): LIPASE, AMYLASE in the last 168 hours. No results for input(s): AMMONIA in the last  168 hours. CBC:  Recent Labs Lab 09/19/16 1447 09/19/16 1451 09/20/16 0356  WBC 9.9  --  9.4  NEUTROABS 5.8  --   --   HGB 7.5* 9.5* 8.2*  HCT 28.0* 28.0* 30.2*  MCV 63.3*  --  65.2*  PLT 331  --  325   Cardiac Enzymes:  Recent Labs Lab 09/19/16 2147 09/20/16 0356 09/20/16 1012  TROPONINI <0.03 <0.03 <0.03   BNP: BNP (last 3 results)  Recent Labs  04/20/16 1340 04/22/16 0022  BNP 140.0* 47.0    ProBNP (last 3 results) No results for input(s): PROBNP in the last 8760 hours.  CBG:  Recent Labs Lab 09/21/16 1106 09/21/16 1618 09/21/16 2047 09/22/16 0740 09/22/16 1136  GLUCAP 243* 164* 148* 150* 209*       Signed:  Ramyah Pankowski M  [facility] Pager: [phone] 09/22/2016, 12:38 PM

## 2016-09-22 NOTE — Progress Notes (Signed)
CM [facility]

## 2016-11-17 IMAGING — CR DG CHEST 1V PORT
1 series · 1 of 1 positions shown · non-contrast
Comparison: Portable exam 7771 hr compared to 11/10/2014

CLINICAL DATA: Chronic bronchitis, continued shortness of breath

EXAM:
PORTABLE CHEST - 1 VIEW

[portable]
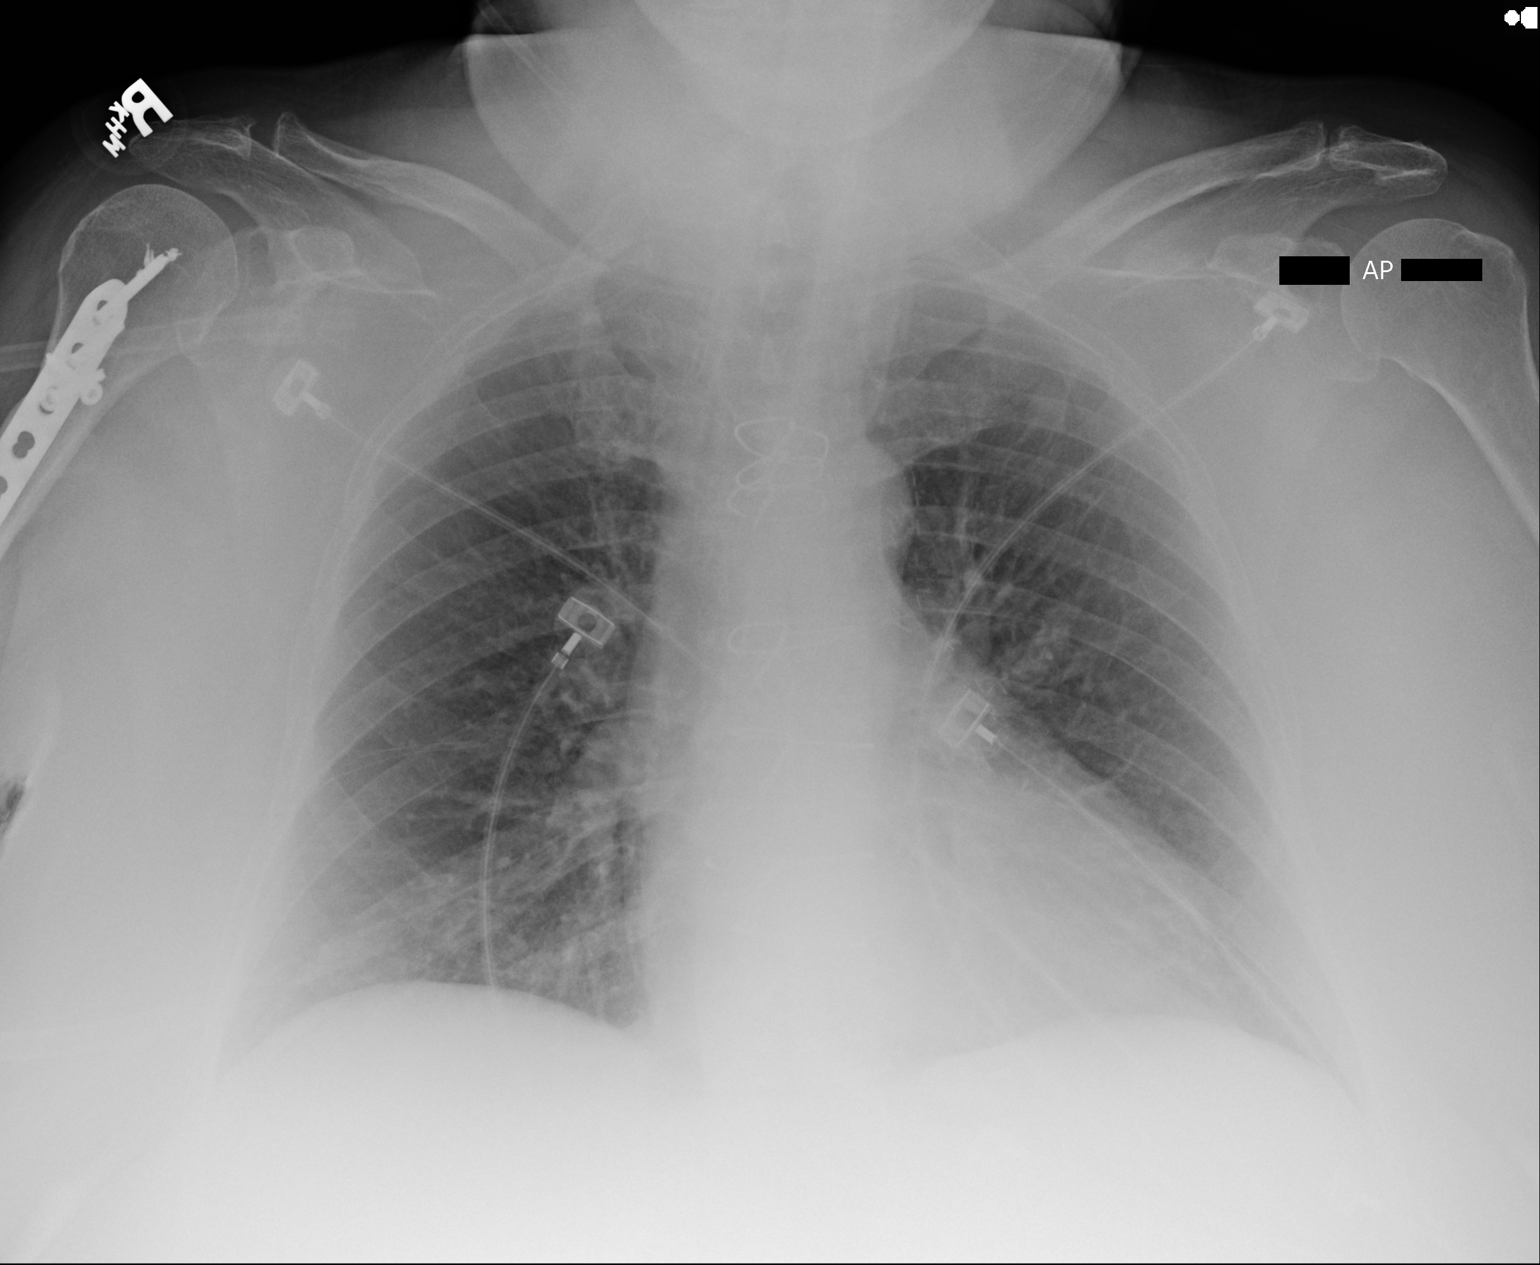

[1 of 1 positions shown; findings below may reference images not displayed]

FINDINGS: Upper normal heart size post CABG.

Mediastinal contours and pulmonary vascularity normal.

Lungs clear.

No pleural effusion or pneumothorax.

Bones unremarkable.
IMPRESSION: No acute abnormalities.

## 2017-02-11 IMAGING — DX DG RIBS W/ CHEST 3+V*L*
5 series · 5 of 5 positions shown · non-contrast
Comparison: Portable chest x-ray November 13, 2014

CLINICAL DATA: Pending injury with subsequent fall striking the
left lower ribcage, now with bruising, mild shortness of breath.

EXAM:
LEFT RIBS AND CHEST - 3+ VIEW

[chest pa]
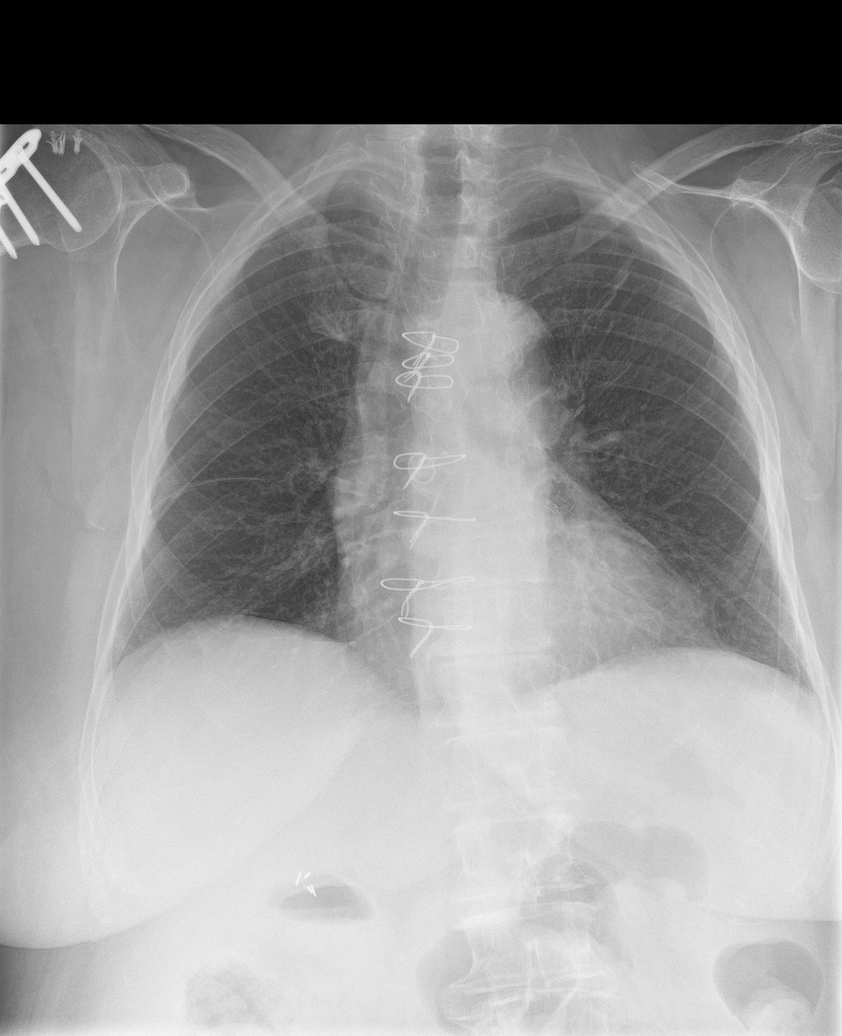

[rib ap (1 of 2)]
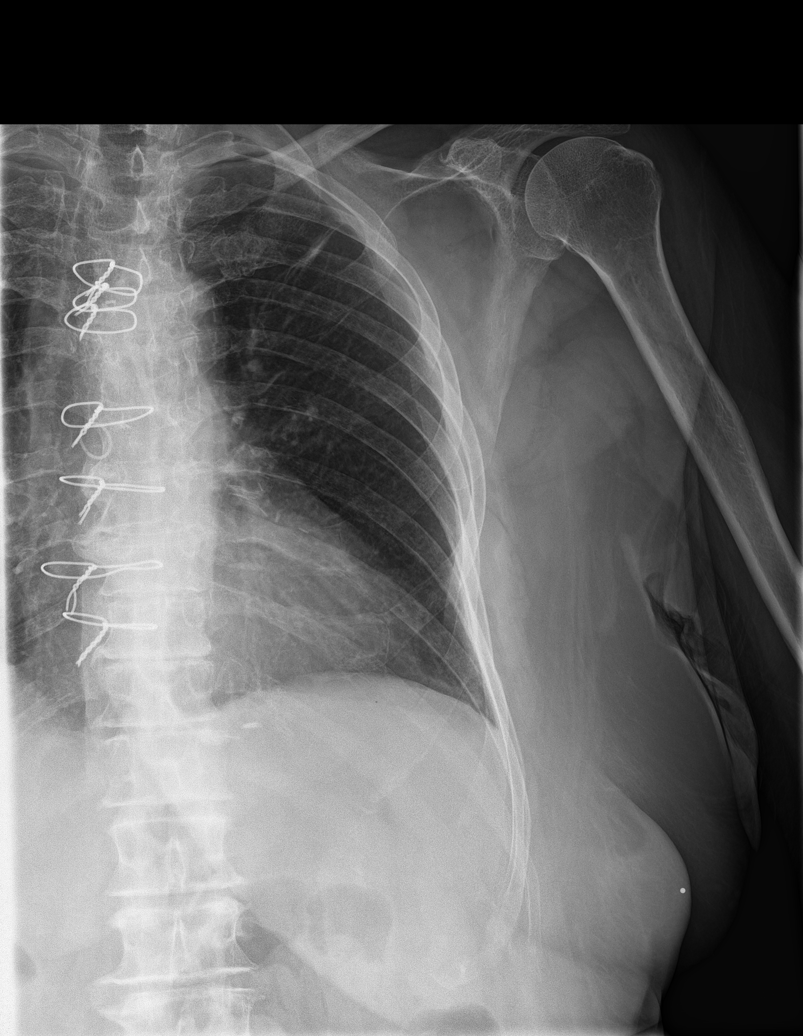

[rib ap (2 of 2)]
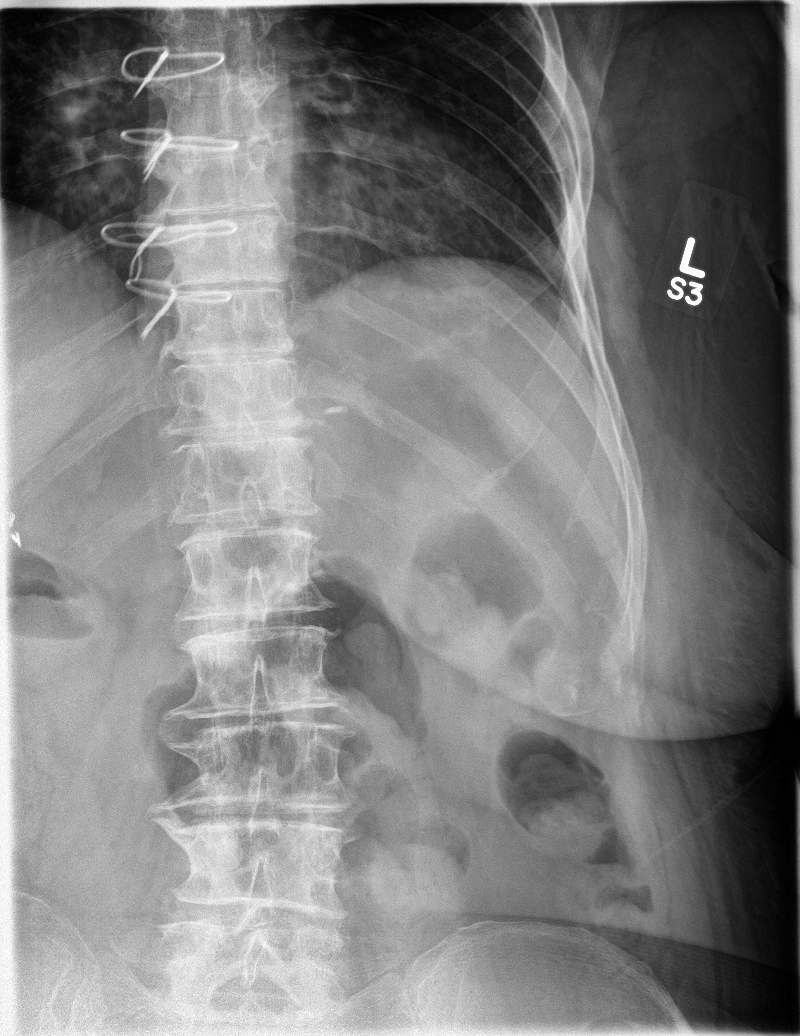

[rib obl (1 of 2)]
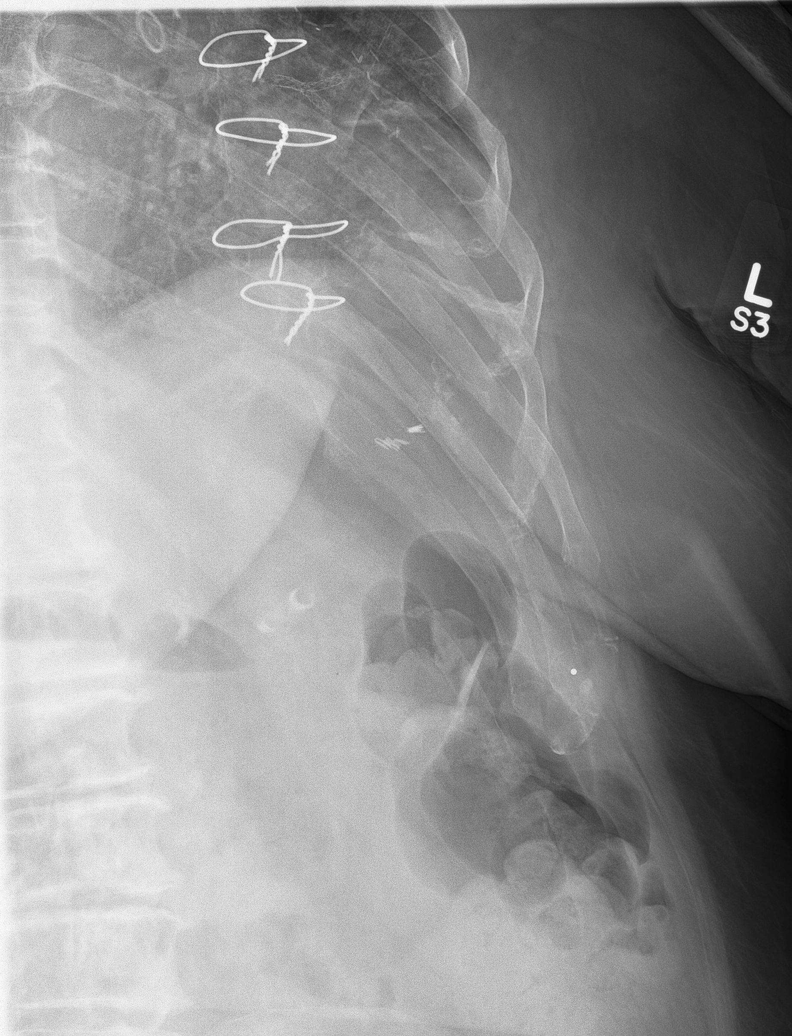

[rib obl (2 of 2)]
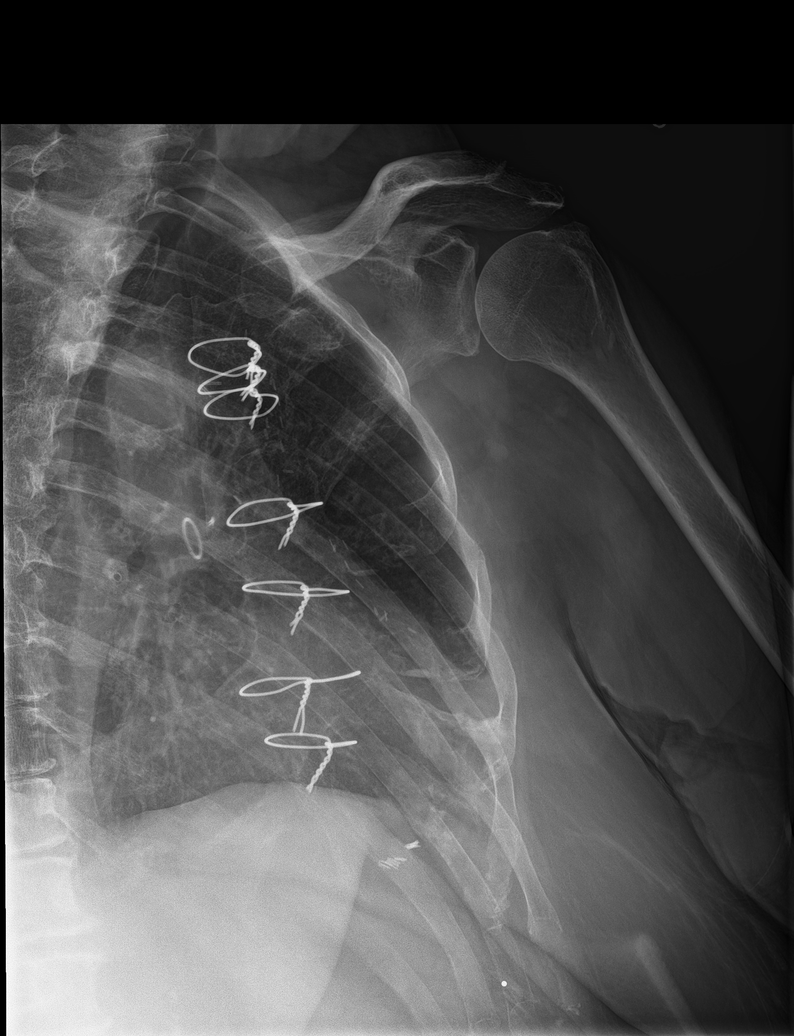

[5 of 5 positions shown; findings below may reference images not displayed]

FINDINGS: The lungs are adequately inflated. There is no focal infiltrate.
There is no pleural effusion or pneumothorax. The heart and
pulmonary vascularity are normal. The patient has undergone previous
CABG. There are 7 intact sternal wires.

There are minimally displaced fractures of the lateral aspects of
the left sixth and seventh ribs. No other fractures are clearly
evident.
IMPRESSION: 1. There are minimally displaced fractures of the lateral aspects of
the left sixth and seventh ribs.
2. There is no pneumothorax, pleural effusion, nor other active
cardiopulmonary disease.

## 2017-03-04 ENCOUNTER — Encounter (HOSPITAL_COMMUNITY): Payer: Self-pay | Admitting: Cardiology

## 2017-03-04 ENCOUNTER — Emergency Department (HOSPITAL_COMMUNITY): Payer: Medicare HMO

## 2017-03-04 ENCOUNTER — Emergency Department (HOSPITAL_COMMUNITY)
Admission: EM | Admit: 2017-03-04 | Discharge: 2017-03-04 | Disposition: A | Discharge status: Home / Self Care | Payer: Medicare HMO | Attending: Emergency Medicine | Admitting: Emergency Medicine

## 2017-03-04 DIAGNOSIS — I110 Hypertensive heart disease with heart failure: Secondary | ICD-10-CM | POA: Diagnosis not present

## 2017-03-04 DIAGNOSIS — J449 Chronic obstructive pulmonary disease, unspecified: Secondary | ICD-10-CM | POA: Insufficient documentation

## 2017-03-04 DIAGNOSIS — Z794 Long term (current) use of insulin: Secondary | ICD-10-CM | POA: Diagnosis not present

## 2017-03-04 DIAGNOSIS — E039 Hypothyroidism, unspecified: Secondary | ICD-10-CM | POA: Insufficient documentation

## 2017-03-04 DIAGNOSIS — R300 Dysuria: Secondary | ICD-10-CM | POA: Insufficient documentation

## 2017-03-04 DIAGNOSIS — R231 Pallor: Secondary | ICD-10-CM | POA: Insufficient documentation

## 2017-03-04 DIAGNOSIS — R079 Chest pain, unspecified: Secondary | ICD-10-CM | POA: Diagnosis not present

## 2017-03-04 DIAGNOSIS — Z79899 Other long term (current) drug therapy: Secondary | ICD-10-CM | POA: Insufficient documentation

## 2017-03-04 DIAGNOSIS — R5383 Other fatigue: Secondary | ICD-10-CM | POA: Diagnosis not present

## 2017-03-04 DIAGNOSIS — I2510 Atherosclerotic heart disease of native coronary artery without angina pectoris: Secondary | ICD-10-CM | POA: Insufficient documentation

## 2017-03-04 DIAGNOSIS — R531 Weakness: Secondary | ICD-10-CM | POA: Diagnosis present

## 2017-03-04 DIAGNOSIS — I5030 Unspecified diastolic (congestive) heart failure: Secondary | ICD-10-CM | POA: Insufficient documentation

## 2017-03-04 DIAGNOSIS — Z7982 Long term (current) use of aspirin: Secondary | ICD-10-CM | POA: Insufficient documentation

## 2017-03-04 DIAGNOSIS — R0689 Other abnormalities of breathing: Secondary | ICD-10-CM | POA: Insufficient documentation

## 2017-03-04 LAB — URINALYSIS, ROUTINE W REFLEX MICROSCOPIC
BILIRUBIN URINE: NEGATIVE
Bacteria, UA: NONE SEEN
Glucose, UA: >=500 mg/dL — AB
Hgb urine dipstick: NEGATIVE
KETONES UR: NEGATIVE mg/dL
Nitrite: POSITIVE — AB
PROTEIN: NEGATIVE mg/dL
Specific Gravity, Urine: 1.015 (ref 1.005–1.030)
pH: 6 (ref 5.0–8.0)

## 2017-03-04 LAB — CBC WITH DIFFERENTIAL/PLATELET
Basophils Absolute: 0 10*3/uL (ref 0.0–0.1)
Basophils Relative: 1 %
EOS ABS: 0.3 10*3/uL (ref 0.0–0.7)
Eosinophils Relative: 3 %
HEMATOCRIT: 33.6 % — AB (ref 36.0–46.0)
HEMOGLOBIN: 10 g/dL — AB (ref 12.0–15.0)
LYMPHS ABS: 2.9 10*3/uL (ref 0.7–4.0)
LYMPHS PCT: 34 %
MCH: 21 pg — AB (ref 26.0–34.0)
MCHC: 29.8 g/dL — AB (ref 30.0–36.0)
MCV: 70.6 fL — AB (ref 78.0–100.0)
MONOS PCT: 12 %
Monocytes Absolute: 1 10*3/uL (ref 0.1–1.0)
NEUTROS ABS: 4.3 10*3/uL (ref 1.7–7.7)
NEUTROS PCT: 51 %
Platelets: 292 10*3/uL (ref 150–400)
RBC: 4.76 MIL/uL (ref 3.87–5.11)
RDW: 17.8 % — ABNORMAL HIGH (ref 11.5–15.5)
WBC: 8.5 10*3/uL (ref 4.0–10.5)

## 2017-03-04 LAB — BASIC METABOLIC PANEL
ANION GAP: 7 (ref 5–15)
BUN: 12 mg/dL (ref 6–20)
CHLORIDE: 100 mmol/L — AB (ref 101–111)
CO2: 28 mmol/L (ref 22–32)
CREATININE: 0.53 mg/dL (ref 0.44–1.00)
Calcium: 8.8 mg/dL — ABNORMAL LOW (ref 8.9–10.3)
GFR calc non Af Amer: >60 mL/min (ref >60)
Glucose, Bld: 259 mg/dL — ABNORMAL HIGH (ref 65–99)
POTASSIUM: 3.9 mmol/L (ref 3.5–5.1)
SODIUM: 135 mmol/L (ref 135–145)

## 2017-03-04 LAB — TROPONIN I: Troponin I: <0.03 ng/mL (ref <0.03)

## 2017-03-04 MED ORDER — CEPHALEXIN 500 MG PO CAPS: 500.0000 mg | ORAL_CAPSULE | Freq: Four times a day (QID) | ORAL | 0 refills | Status: DC

## 2017-03-04 NOTE — ED Notes (Signed)
Dr Pickering at bedside 

## 2017-03-04 NOTE — ED Provider Notes (Signed)
[facility]EM[facility] Provider Note   CSN: [id number] Arrival date & time: 03/04/17  1044   By signing my name below, I, Bobbie Stackhristopher Reid, attest that this documentation has been prepared under the direction and in the presence of Benjiman CoreNathan Homer Miller, MD. Electronically Signed: Bobbie Stackhristopher Reid, Scribe. 03/04/17. 11:05 AM. History   Chief Complaint Chief Complaint  Patient presents with  . Weakness    The history is provided by the patient. No language interpreter was used.  HPI Comments: [name] [name] is a 81 y.o. female brought in by ambulance, who presents to the Emergency Department complaining of generalized weakness for the past 2 to 3 days. The patient states that she "feels weak all over". She was recently treated for a UTI. She states that she feel as though the UTI goes away and then returns quite frequently. She reports dysuria. She also reports some CP over the past week which she has taken NTG with significant improvement. She denies fever, cough, blood in stool, and weight loss. She states that her appetite is "fair". She has a hx of UTI.  Past Medical History:  Diagnosis Date  . Arthritis   . CAD (coronary artery disease)    Multivessel status post CABG 2001  . Chronic obstructive pulmonary disease (HCC)   . DDD (degenerative disc disease), lumbar   . Diastolic CHF (HCC)   . Essential hypertension   . Hematuria   . History of CVA (cerebrovascular accident)    2013 - lacunar infarct right thalamus - residual left side of  lip numb  and left eye vision worse (which corrented lens implant from cataract extraction)  . History of MI (myocardial infarction)    July 2001  . Hyperlipidemia   . Hypothyroidism   . Insulin dependent type 2 diabetes mellitus (HCC)   . Lesion of bladder   . Lower urinary tract symptoms (LUTS)   . OSA (obstructive sleep apnea)   . Peripheral neuropathy (HCC)   . Psoriasis   . SUI (stress urinary incontinence, female)   . Varicose veins      Patient Active Problem List   Diagnosis Date Noted  . Stroke (HCC) 08/01/2016  . Dysarthria 03/18/2016  . Influenza-like illness 11/19/2015  . Anemia of chronic disease 11/19/2015  . OSA (obstructive sleep apnea) 11/19/2015  . Left facial numbness 08/29/2015  . Gait instability 06/26/2015  . Bronchitis 11/13/2014  . COPD exacerbation (HCC) 11/13/2014  . UTI (urinary tract infection) 08/26/2014  . Near syncope 08/24/2014  . UTI (lower urinary tract infection) 12/10/2013  . Hypoxia 12/10/2013  . Weakness generalized 12/10/2013  . TIA (transient ischemic attack) 06/22/2012  . Left-sided headache 06/22/2012  . Noncompliance 06/22/2012  . Noncompliance with CPAP treatment 06/22/2012  . Psoriasis 01/07/2012  . Cerebrovascular disease   . Chronic back pain   . Chronic obstructive pulmonary disease (HCC)   . Chest pain 08/28/2011  . DM type 2 causing complication (HCC) 02/14/2010  . Obesity 02/14/2010  . Arteriosclerotic cardiovascular disease (ASCVD) 02/14/2010  . Hypothyroidism 02/06/2010  . Hyperlipidemia 02/06/2010  . Essential hypertension 02/06/2010  . Sleep apnea 02/06/2010    Past Surgical History:  Procedure Laterality Date  . APPENDECTOMY  age 89  . CARDIAC CATHETERIZATION  1991   No sig. cad  . CARDIAC CATHETERIZATION  07-01-2000   high grade in-stent restenosis  . CARDIAC CATHETERIZATION  04-30-2001   single native vessel cad/ graft patent  . CARDIAC CATHETERIZATION  02-13-2006  dr Jenness Cornerbrodie   2V cad  with total occluded LAD  and  70% ostial of small ramus/  grafts patent/  normal ef  . CARDIAC CATHETERIZATION  01-20-2000  dr Clifton James   essentially unchanged;  severe single vessel cad with diffuse disease throughout CFX and RCA/ ef 66%;  chf with preserved LVSF  . CARDIOVASCULAR STRESS TEST  08-28-2011   DR ROTHBART   NEGATIVE NUCLEAR STUDY/  GLOBAL LVSF/  EF 65%  . CARPAL TUNNEL RELEASE Bilateral yrs ago  . CATARACT EXTRACTION W/ INTRAOCULAR LENS  IMPLANT,  BILATERAL  2013  . CHOLECYSTECTOMY  1990  . COLONOSCOPY N/A 07/26/2015   Procedure: COLONOSCOPY;  Surgeon: Malissa Hippo, MD;  Location: [facility];  Service: Endoscopy;  Laterality: N/A;  1200  . CORONARY ANGIOPLASTY  11-13-1999   balloon angioplasty to mLAD , d2 of LAD  . CORONARY ANGIOPLASTY WITH STENT PLACEMENT  01-17-2000   PCI stenting to mLAD & D2 of LAD  . CORONARY ARTERY BYPASS GRAFT  07-02-2000   DR Tressie Stalker   SVG to diagonal, LIMA to LAD  . CYSTOSCOPY W/ RETROGRADES Bilateral 08/09/2014   Procedure: CYSTOSCOPY, BILATERAL RETROGRADE, HYDRODISTENSION, BLADDER BIOPSY WITH FULGERATION, INSTILL PYRIDIUM AND MARCAINE ;  Surgeon: Anner Crete, MD;  Location: [facility];  Service: Urology;  Laterality: Bilateral;  . KNEE ARTHROSCOPY Left 10-18-2003   SYNOVECTOMY  . LUMBAR SPINE SURGERY  x2    yrs ago  . ORIF HUMERUS FRACTURE Right 04-18-2008  . REPAIR FLEXOR TENDON HAND Right 09-20-2008   CARPI RADIALIS TENDON TRANSFER TO EXTENSOR TO INDEX, MIDDLE, RING , LITTLE FINGERS AND DIGITI MININI  . ROTATOR CUFF REPAIR Right 1995  . TOTAL ABDOMINAL HYSTERECTOMY W/ BILATERAL SALPINGOOPHORECTOMY  age 68  . TRANSTHORACIC ECHOCARDIOGRAM  12-12-2013   MILD LVH/  EF 60-65%/  GRADE I DIASTOLIC DYSFUNCTION/  MILD LAE    OB History    Gravida Para Term Preterm AB Living   7 7 7     4    SAB TAB Ectopic Multiple Live Births                   Home Medications    Prior to Admission medications   Medication Sig Start Date End Date Taking? Authorizing Provider  albuterol (PROVENTIL) (2.5 MG/3ML) 0.083% nebulizer solution Take 3 mLs (2.5 mg total) by nebulization every 4 (four) hours as needed for wheezing or shortness of breath. 04/20/16   Bethann Berkshire, MD  aspirin EC 325 MG tablet Take 325 mg by mouth daily.    Historical Provider, MD  ferrous sulfate 325 (65 FE) MG tablet Take 1 tablet (325 mg total) by mouth 2 (two) times daily with a meal. 09/22/16   Oval Linsey, MD   Insulin Degludec (TRESIBA FLEXTOUCH) 200 UNIT/ML SOPN Inject 35-70 Units into the skin 2 (two) times daily. 35 units in the morning and 70 units in the evening    Historical Provider, MD  levothyroxine (SYNTHROID, LEVOTHROID) 25 MCG tablet Take 25 mcg by mouth every morning.     Historical Provider, MD  lisinopril (PRINIVIL,ZESTRIL) 10 MG tablet Take 1 tablet by mouth every morning.  11/09/13   Historical Provider, MD  LORazepam (ATIVAN) 2 MG tablet Take 1 tablet by mouth at bedtime. 07/26/16   Historical Provider, MD  metFORMIN (GLUCOPHAGE) 500 MG tablet Take 500 mg by mouth 2 (two) times daily with a meal.      Historical Provider, MD  Multiple Vitamin (MULTIVITAMIN WITH MINERALS) TABS tablet Take 1 tablet  by mouth daily.    Historical Provider, MD  nitroGLYCERIN (NITROSTAT) 0.4 MG SL tablet Place 0.4 mg under the tongue every 5 (five) minutes as needed for chest pain.     Historical Provider, MD  NOVOLIN 70/30 RELION (70-30) 100 UNIT/ML injection Inject 20 Units into the skin 3 (three) times daily before meals. 02/25/16   Historical Provider, MD  oxyCODONE-acetaminophen (PERCOCET) 10-325 MG tablet Take 1 tablet by mouth 4 (four) times daily as needed for pain.  07/26/16   Historical Provider, MD  pravastatin (PRAVACHOL) 20 MG tablet Take 20 mg by mouth at bedtime.     Historical Provider, MD  senna-docusate (SENOKOT-S) 8.6-50 MG tablet Take 1 tablet by mouth at bedtime as needed for mild constipation. 08/06/16   Oval Linsey, MD    Family History Family History  Problem Relation Age of Onset  . Hypertension Mother   . Stroke Mother   . Coronary artery disease      Multiple first and second-degree relatives  . Aneurysm      Cerebral circulation  . Colon cancer Sister   . Stroke Sister   . Coronary artery disease Sister   . Clotting disorder      Children diagnosed with hypercoagulable state  . Coronary artery disease Brother     Social History Social History  Substance Use Topics  .  Smoking status: Never Smoker  . Smokeless tobacco: Never Used  . Alcohol use No     Allergies   Codeine   Review of Systems Review of Systems  Constitutional: Positive for fatigue. Negative for appetite change and fever.  Respiratory: Negative for cough.   Cardiovascular: Positive for chest pain.  Gastrointestinal: Negative for blood in stool.  Genitourinary: Positive for dysuria.  All other systems reviewed and are negative.  Physical Exam Updated Vital Signs BP 170/74 (BP Location: Right Arm)   Pulse 69   Temp 98.1 F (36.7 C) (Oral)   Resp 20   Ht 5' 4.5" (1.638 m)   Wt 201 lb (91.2 kg)   SpO2 92%   BMI 33.97 kg/m   Physical Exam  Constitutional: She is oriented to person, place, and time. She appears well-developed and well-nourished. No distress.  HENT:  Head: Normocephalic and atraumatic.  Eyes: EOM are normal.  Neck: Normal range of motion.  Cardiovascular: Normal rate, regular rhythm and normal heart sounds.   Pulmonary/Chest: Effort normal. She has rales.  Mild scattered rales.   Abdominal: Soft. She exhibits no distension. There is no tenderness.  Musculoskeletal: Normal range of motion. She exhibits no edema.  No peripheral edema.  Neurological: She is alert and oriented to person, place, and time.  Skin: Skin is warm and dry. There is pallor (Legs).  Psychiatric: She has a normal mood and affect. Judgment normal.  Nursing note and vitals reviewed.  ED Treatments / Results  DIAGNOSTIC STUDIES: Oxygen Saturation is 92% on RA, low by my interpretation.    COORDINATION OF CARE: 10:56 AM Discussed treatment plan with pt at bedside and pt agreed to plan. I will check the patient's labs.  Labs (all labs ordered are listed, but only abnormal results are displayed) Labs Reviewed  BASIC METABOLIC PANEL  CBC WITH DIFFERENTIAL/PLATELET  TROPONIN I  URINALYSIS, ROUTINE W REFLEX MICROSCOPIC    EKG  EKG Interpretation  Date/Time:  Tuesday March 04 2017  10:53:02 EDT Ventricular Rate:  63 PR Interval:    QRS Duration: 102 QT Interval:  416 QTC Calculation: 426 R  Axis:   -61 Text Interpretation:  Sinus rhythm Left anterior fascicular block Consider anterior infarct Nonspecific T abnormalities, lateral leads Baseline wander in lead(s) V6 Confirmed by Rubin Payor  MD, Harrold Donath [phone]) on 03/04/2017 10:57:55 AM       Radiology No results found.  Procedures Procedures (including critical care time)  Medications Ordered in ED Medications - No data to display   Initial Impression / Assessment and Plan / ED Course  I have reviewed the triage vital signs and the nursing notes.  Pertinent labs & imaging results that were available during my care of the patient were reviewed by me and considered in my medical decision making (see chart for details).     Patient with dysuria and generalized weakness. Overall benign exam. Has nitrites and leuk esterase and white cells in urine. However does not have any bacteria. Has reportedly had bladder workup in the past and has been ruled out for interstitial cystitis. She is having increased dysuria however. Will empirically treating cultures been sent however primary care doctor will need to follow with the culture comes back negative may need to stop the antibiotics. Will discharge home.  Final Clinical Impressions(s) / ED Diagnoses   Final diagnoses:  None    New Prescriptions New Prescriptions   No medications on file   I personally performed the services described in this documentation, which was scribed in my presence. The recorded information has been reviewed and is accurate.      Benjiman Core, MD 03/04/17 1353

## 2017-03-04 NOTE — ED Triage Notes (Addendum)
Woke up this morning with weakness.  Pt states she does not feel right.  CBG 326.   Per EMS pt feels hot to touch.  VSS,  Pt states she was recently treated for an UTI and feels like the UTI is not gone.

## 2017-03-04 NOTE — ED Notes (Signed)
Ambulated to restroom with one person assist, pt states she is a little off balance when ambulating.  Gait is mildly unsteady

## 2017-03-06 LAB — URINE CULTURE

## 2017-03-09 IMAGING — US US CAROTID DUPLEX BILAT
1 series · 13 of 24 positions shown · non-contrast
Comparison: 06/27/2015 MRI

CLINICAL DATA: Hypertension, stroke symptoms, hyperlipidemia and
diabetes.

EXAM:
BILATERAL CAROTID DUPLEX ULTRASOUND
TECHNIQUE: Gray scale imaging, color Doppler and duplex ultrasound were
performed of bilateral carotid and vertebral arteries in the neck.

[Series 1: us carotid duplex bilat · 0.06mm/px · 13 of 91 slices shown]
[im 1/91]
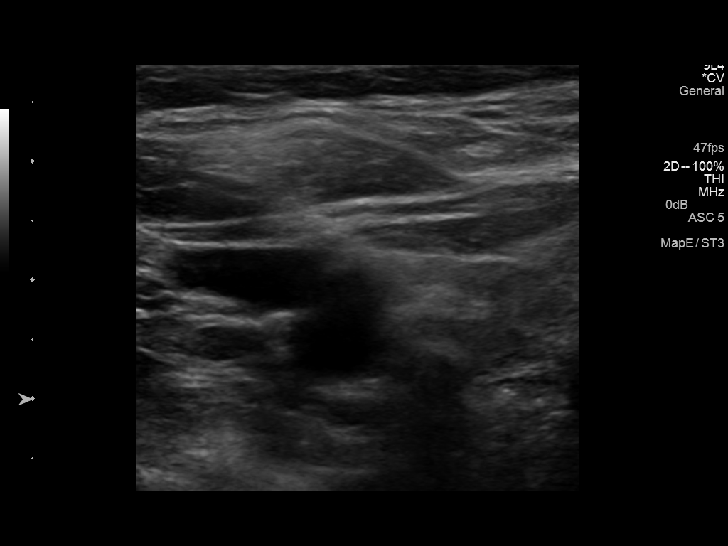
[im 8/91]
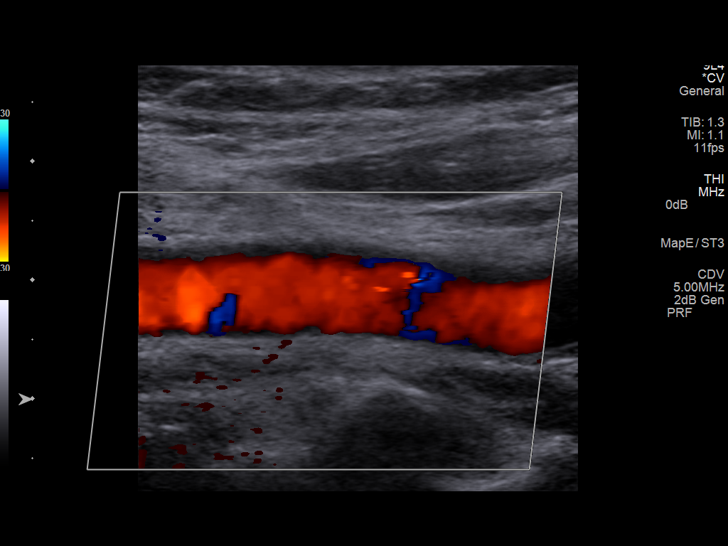
[im 16/91]
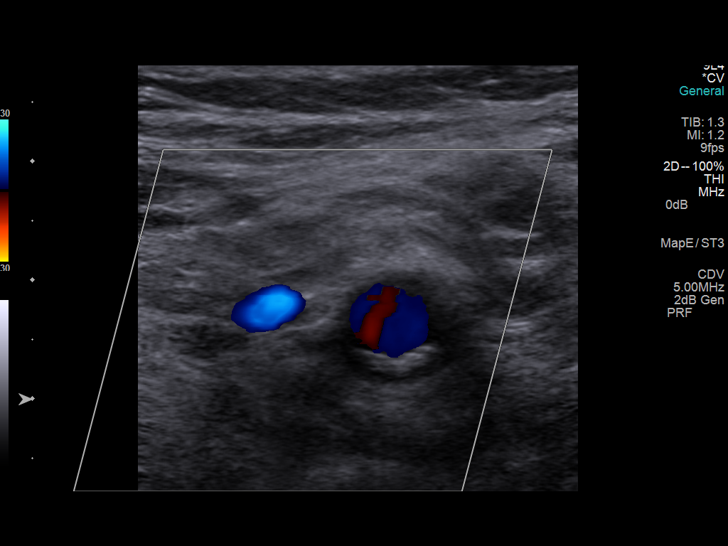
[im 24/91]
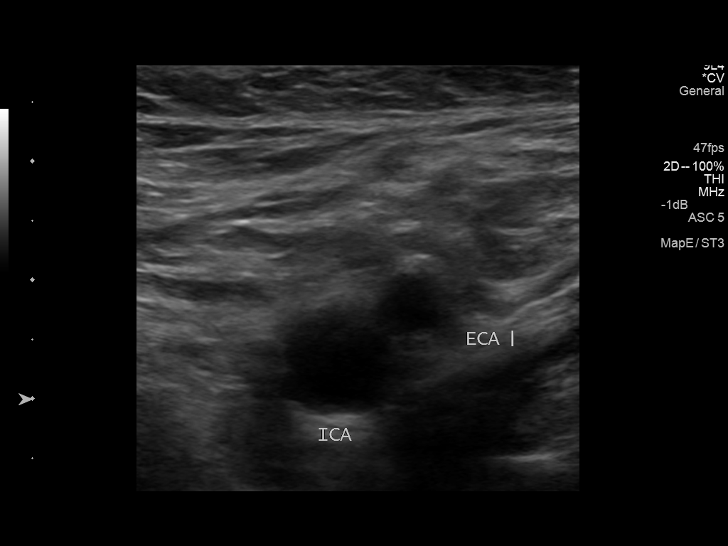
[im 32/91]
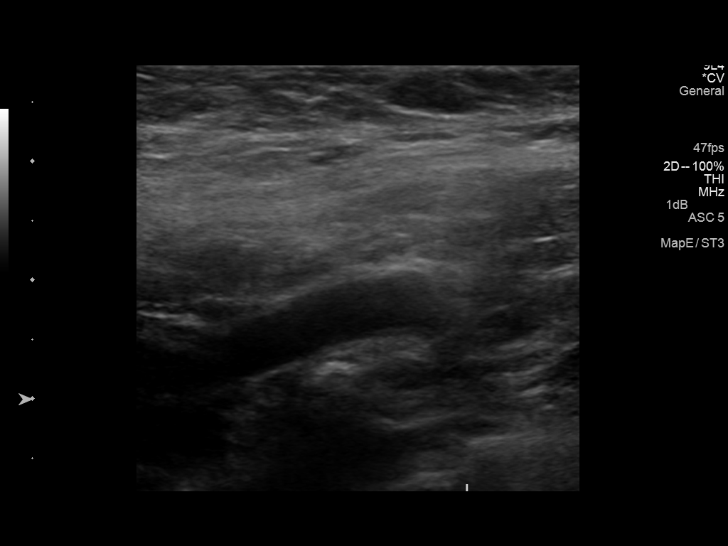
[im 40/91]
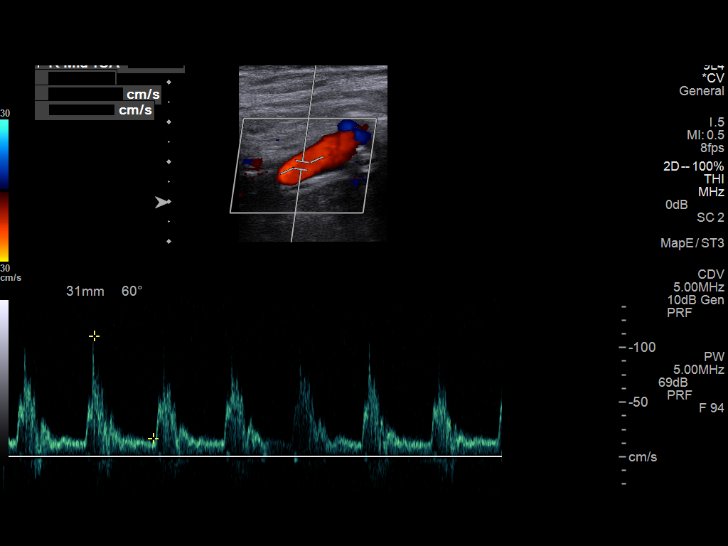
[im 47/91]
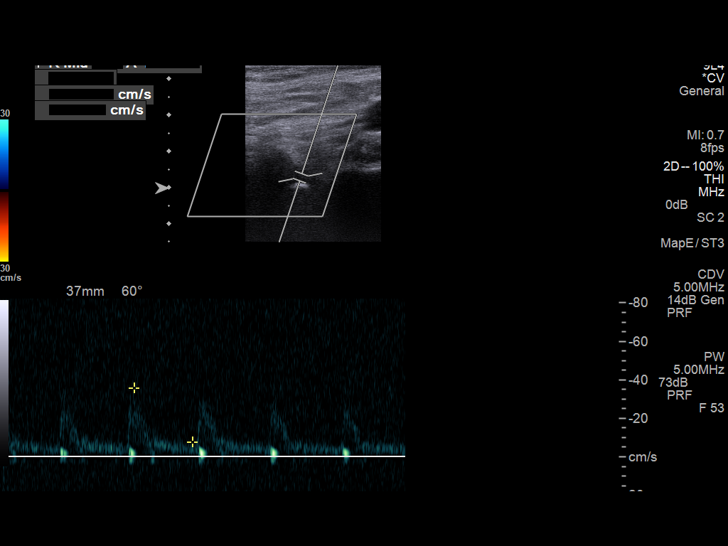
[im 51/91]
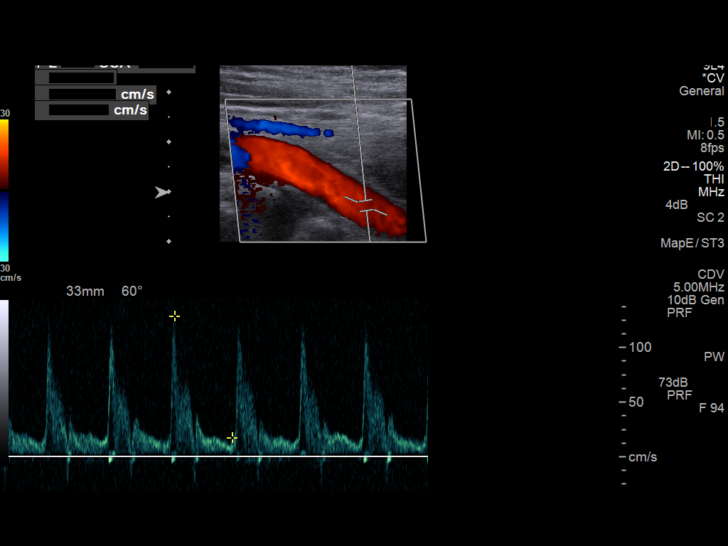
[im 59/91]
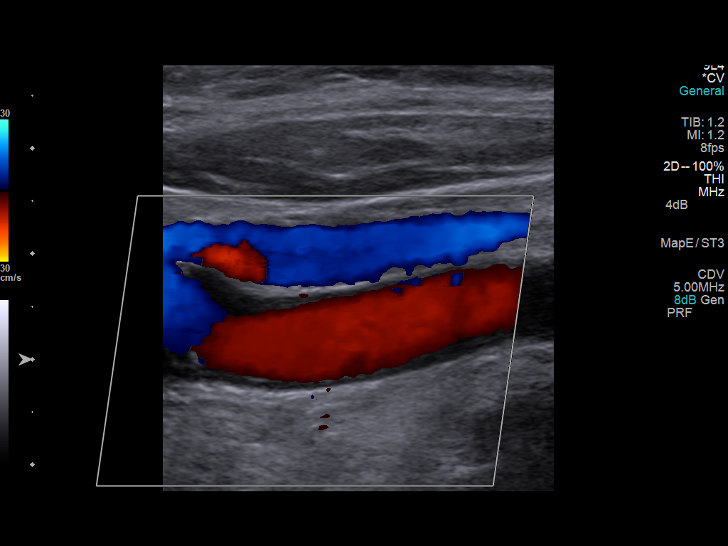
[im 67/91]
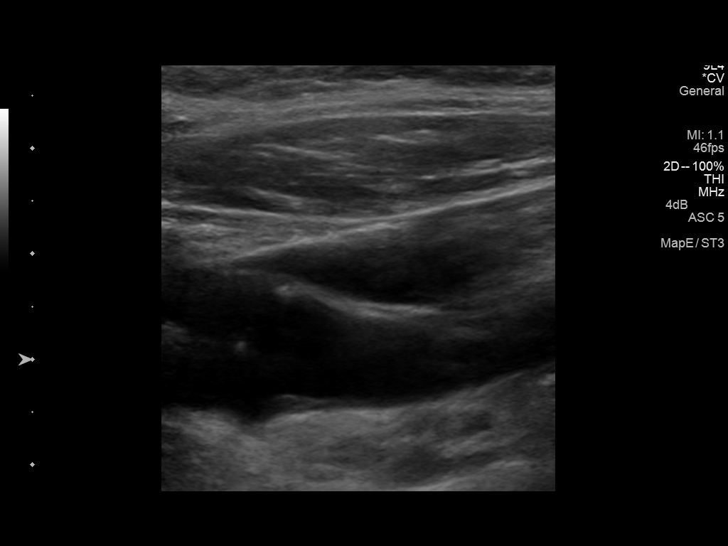
[im 75/91]
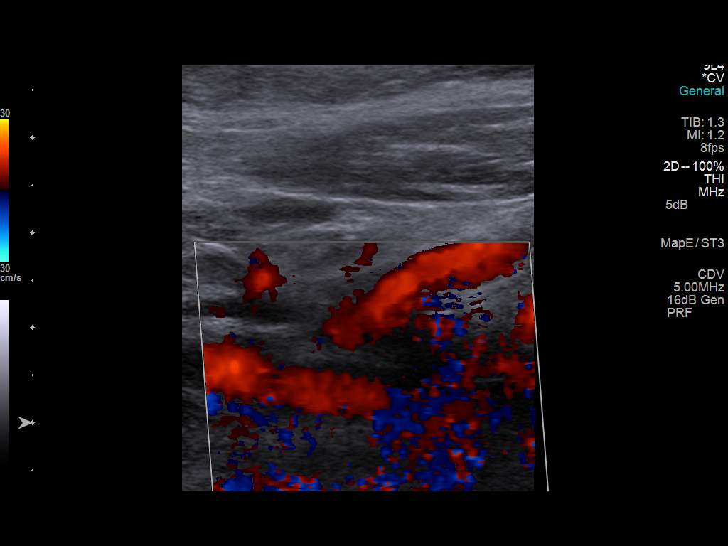
[im 83/91]
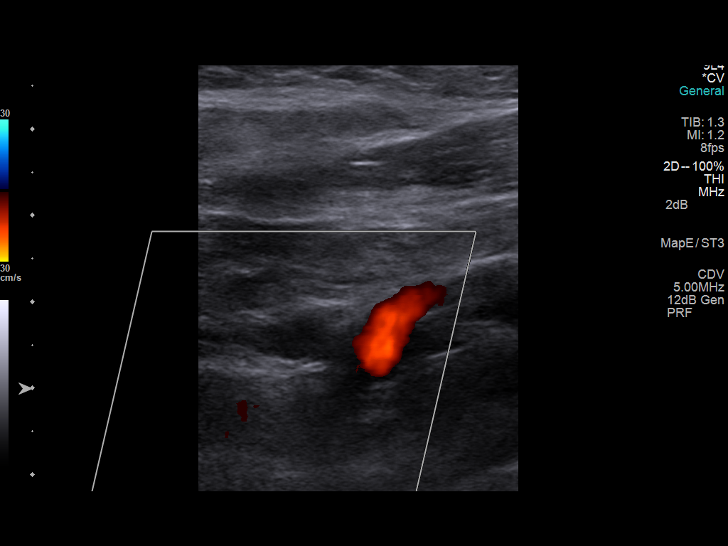
[im 91/91]
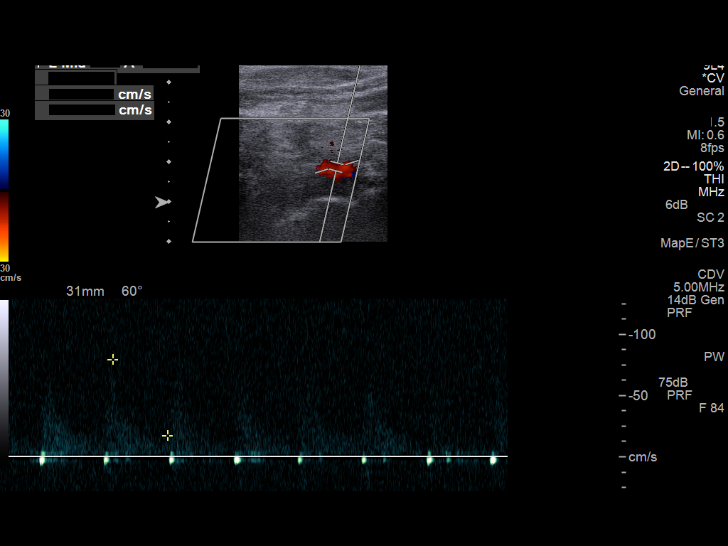

[13 of 24 positions shown; findings below may reference images not displayed]

FINDINGS: Criteria: Quantification of carotid stenosis is based on velocity
parameters that correlate the residual internal carotid diameter
with NASCET-based stenosis levels, using the diameter of the distal
internal carotid lumen as the denominator for stenosis measurement.

The following velocity measurements were obtained:

RIGHT

ICA:  111/17 cm/sec

CCA:  94/12 cm/sec

SYSTOLIC ICA/CCA RATIO:

DIASTOLIC ICA/CCA RATIO:

ECA:  132 cm/sec

LEFT

ICA:  122/27 cm/sec

CCA:  116/20 cm/sec

SYSTOLIC ICA/CCA RATIO:

DIASTOLIC ICA/CCA RATIO:

ECA:  100 cm/sec

RIGHT CAROTID ARTERY: Minor echogenic shadowing plaque formation. No
hemodynamically significant right ICA stenosis, velocity elevation,
or turbulent flow. Degree of narrowing less than 50%.

RIGHT VERTEBRAL ARTERY:  Antegrade

LEFT CAROTID ARTERY: Similar scattered minor echogenic plaque
formation. No hemodynamically significant left ICA stenosis,
velocity elevation, or turbulent flow.

LEFT VERTEBRAL ARTERY:  Antegrade
IMPRESSION: Minor carotid atherosclerosis. No hemodynamically significant ICA
stenosis by ultrasound. Degree of narrowing less than 50%
bilaterally.

## 2017-03-11 ENCOUNTER — Emergency Department (HOSPITAL_COMMUNITY)
Admission: EM | Admit: 2017-03-11 | Discharge: 2017-03-11 | Disposition: A | Discharge status: Home / Self Care | Payer: Medicare HMO | Attending: Emergency Medicine | Admitting: Emergency Medicine

## 2017-03-11 ENCOUNTER — Encounter (HOSPITAL_COMMUNITY): Payer: Self-pay | Admitting: Emergency Medicine

## 2017-03-11 DIAGNOSIS — J449 Chronic obstructive pulmonary disease, unspecified: Secondary | ICD-10-CM | POA: Insufficient documentation

## 2017-03-11 DIAGNOSIS — Z79899 Other long term (current) drug therapy: Secondary | ICD-10-CM | POA: Diagnosis not present

## 2017-03-11 DIAGNOSIS — I5030 Unspecified diastolic (congestive) heart failure: Secondary | ICD-10-CM | POA: Diagnosis not present

## 2017-03-11 DIAGNOSIS — E039 Hypothyroidism, unspecified: Secondary | ICD-10-CM | POA: Diagnosis not present

## 2017-03-11 DIAGNOSIS — E119 Type 2 diabetes mellitus without complications: Secondary | ICD-10-CM | POA: Insufficient documentation

## 2017-03-11 DIAGNOSIS — Z794 Long term (current) use of insulin: Secondary | ICD-10-CM | POA: Diagnosis not present

## 2017-03-11 DIAGNOSIS — Z951 Presence of aortocoronary bypass graft: Secondary | ICD-10-CM | POA: Insufficient documentation

## 2017-03-11 DIAGNOSIS — N3000 Acute cystitis without hematuria: Secondary | ICD-10-CM | POA: Insufficient documentation

## 2017-03-11 DIAGNOSIS — I25810 Atherosclerosis of coronary artery bypass graft(s) without angina pectoris: Secondary | ICD-10-CM | POA: Diagnosis not present

## 2017-03-11 DIAGNOSIS — Z7982 Long term (current) use of aspirin: Secondary | ICD-10-CM | POA: Diagnosis not present

## 2017-03-11 DIAGNOSIS — I110 Hypertensive heart disease with heart failure: Secondary | ICD-10-CM | POA: Diagnosis not present

## 2017-03-11 DIAGNOSIS — N390 Urinary tract infection, site not specified: Secondary | ICD-10-CM | POA: Diagnosis present

## 2017-03-11 LAB — COMPREHENSIVE METABOLIC PANEL
ALK PHOS: 68 U/L (ref 38–126)
ALT: 13 U/L — ABNORMAL LOW (ref 14–54)
ANION GAP: 7 (ref 5–15)
AST: 16 U/L (ref 15–41)
Albumin: 3.6 g/dL (ref 3.5–5.0)
BILIRUBIN TOTAL: 0.2 mg/dL — AB (ref 0.3–1.2)
BUN: 13 mg/dL (ref 6–20)
CALCIUM: 9.3 mg/dL (ref 8.9–10.3)
CO2: 25 mmol/L (ref 22–32)
Chloride: 105 mmol/L (ref 101–111)
Creatinine, Ser: 0.61 mg/dL (ref 0.44–1.00)
GFR calc non Af Amer: >60 mL/min (ref >60)
GLUCOSE: 168 mg/dL — AB (ref 65–99)
Potassium: 3.8 mmol/L (ref 3.5–5.1)
Sodium: 137 mmol/L (ref 135–145)
TOTAL PROTEIN: 6.5 g/dL (ref 6.5–8.1)

## 2017-03-11 LAB — URINALYSIS, ROUTINE W REFLEX MICROSCOPIC
Bacteria, UA: NONE SEEN
Bilirubin Urine: NEGATIVE
GLUCOSE, UA: 150 mg/dL — AB
KETONES UR: NEGATIVE mg/dL
NITRITE: POSITIVE — AB
PH: 5 (ref 5.0–8.0)
Protein, ur: NEGATIVE mg/dL
SPECIFIC GRAVITY, URINE: 1.023 (ref 1.005–1.030)

## 2017-03-11 LAB — CBC
HCT: 35.7 % — ABNORMAL LOW (ref 36.0–46.0)
HEMOGLOBIN: 10.5 g/dL — AB (ref 12.0–15.0)
MCH: 20.8 pg — ABNORMAL LOW (ref 26.0–34.0)
MCHC: 29.4 g/dL — AB (ref 30.0–36.0)
MCV: 70.7 fL — ABNORMAL LOW (ref 78.0–100.0)
PLATELETS: 331 10*3/uL (ref 150–400)
RBC: 5.05 MIL/uL (ref 3.87–5.11)
RDW: 18.1 % — ABNORMAL HIGH (ref 11.5–15.5)
WBC: 11.9 10*3/uL — ABNORMAL HIGH (ref 4.0–10.5)

## 2017-03-11 LAB — TROPONIN I: Troponin I: <0.03 ng/mL (ref <0.03)

## 2017-03-11 MED ORDER — CIPROFLOXACIN HCL 500 MG PO TABS: 500.0000 mg | ORAL_TABLET | Freq: Two times a day (BID) | ORAL | 0 refills | Status: DC

## 2017-03-11 MED ORDER — LEVOFLOXACIN IN D5W 750 MG/150ML IV SOLN
750.0000 mg | Freq: Once | INTRAVENOUS | Status: AC
  Administered 2017-03-11: 750 mg via INTRAVENOUS
  Filled 2017-03-11: qty 150

## 2017-03-11 MED ORDER — ONDANSETRON HCL 4 MG/2ML IJ SOLN
4.0000 mg | Freq: Once | INTRAMUSCULAR | Status: AC
Start: 2017-03-11 — End: 2017-03-11
  Administered 2017-03-11: 4 mg via INTRAVENOUS
  Filled 2017-03-11: qty 2

## 2017-03-11 MED ORDER — HYDROCODONE-ACETAMINOPHEN 5-325 MG PO TABS
2.0000 | ORAL_TABLET | Freq: Once | ORAL | Status: AC
Start: 2017-03-11 — End: 2017-03-11
  Administered 2017-03-11: 2 via ORAL
  Filled 2017-03-11: qty 2

## 2017-03-11 MED ORDER — SODIUM CHLORIDE 0.9 % IV BOLUS (SEPSIS)
1000.0000 mL | Freq: Once | INTRAVENOUS | Status: AC
  Administered 2017-03-11: 1000 mL via INTRAVENOUS

## 2017-03-11 NOTE — ED Triage Notes (Signed)
Being treated for uti.  Pt states she is almost done with her abx.  States she is weak, burning during urination and having back pains.

## 2017-03-11 NOTE — ED Provider Notes (Signed)
[facility] Provider Note   CSN: [id number] Arrival date & time: 03/11/17  1313     History   Chief Complaint Chief Complaint  Patient presents with  . Recurrent UTI    HPI [name] [name] is a 81 y.o. female.  Pt presents to the ED today with sx of an UTI.  She was here on the 13th and was dx'd with an UTI.  She was put on Keflex and has taken almost the whole course.  She continues to feel weak and have burning with urination.        Past Medical History:  Diagnosis Date  . Arthritis   . CAD (coronary artery disease)    Multivessel status post CABG 2001  . Chronic obstructive pulmonary disease (HCC)   . DDD (degenerative disc disease), lumbar   . Diastolic CHF (HCC)   . Essential hypertension   . Hematuria   . History of CVA (cerebrovascular accident)    2013 - lacunar infarct right thalamus - residual left side of  lip numb  and left eye vision worse (which corrented lens implant from cataract extraction)  . History of MI (myocardial infarction)    July 2001  . Hyperlipidemia   . Hypothyroidism   . Insulin dependent type 2 diabetes mellitus (HCC)   . Lesion of bladder   . Lower urinary tract symptoms (LUTS)   . OSA (obstructive sleep apnea)   . Peripheral neuropathy (HCC)   . Psoriasis   . SUI (stress urinary incontinence, female)   . Varicose veins     Patient Active Problem List   Diagnosis Date Noted  . Stroke (HCC) 08/01/2016  . Dysarthria 03/18/2016  . Influenza-like illness 11/19/2015  . Anemia of chronic disease 11/19/2015  . OSA (obstructive sleep apnea) 11/19/2015  . Left facial numbness 08/29/2015  . Gait instability 06/26/2015  . Bronchitis 11/13/2014  . COPD exacerbation (HCC) 11/13/2014  . UTI (urinary tract infection) 08/26/2014  . Near syncope 08/24/2014  . UTI (lower urinary tract infection) 12/10/2013  . Hypoxia 12/10/2013  . Weakness generalized 12/10/2013  . TIA (transient ischemic attack) 06/22/2012  . Left-sided  headache 06/22/2012  . Noncompliance 06/22/2012  . Noncompliance with CPAP treatment 06/22/2012  . Psoriasis 01/07/2012  . Cerebrovascular disease   . Chronic back pain   . Chronic obstructive pulmonary disease (HCC)   . Chest pain 08/28/2011  . DM type 2 causing complication (HCC) 02/14/2010  . Obesity 02/14/2010  . Arteriosclerotic cardiovascular disease (ASCVD) 02/14/2010  . Hypothyroidism 02/06/2010  . Hyperlipidemia 02/06/2010  . Essential hypertension 02/06/2010  . Sleep apnea 02/06/2010    Past Surgical History:  Procedure Laterality Date  . APPENDECTOMY  age 67  . CARDIAC CATHETERIZATION  1991   No sig. cad  . CARDIAC CATHETERIZATION  07-01-2000   high grade in-stent restenosis  . CARDIAC CATHETERIZATION  04-30-2001   single native vessel cad/ graft patent  . CARDIAC CATHETERIZATION  02-13-2006  dr Jenness Corner cad with total occluded LAD  and  70% ostial of small ramus/  grafts patent/  normal ef  . CARDIAC CATHETERIZATION  01-20-2000  dr Clifton James   essentially unchanged;  severe single vessel cad with diffuse disease throughout CFX and RCA/ ef 66%;  chf with preserved LVSF  . CARDIOVASCULAR STRESS TEST  08-28-2011   DR ROTHBART   NEGATIVE NUCLEAR STUDY/  GLOBAL LVSF/  EF 65%  . CARPAL TUNNEL RELEASE Bilateral yrs ago  . CATARACT EXTRACTION W/ INTRAOCULAR  LENS  IMPLANT, BILATERAL  2013  . CHOLECYSTECTOMY  1990  . COLONOSCOPY N/A 07/26/2015   Procedure: COLONOSCOPY;  Surgeon: Malissa Hippo, MD;  Location: [facility];  Service: Endoscopy;  Laterality: N/A;  1200  . CORONARY ANGIOPLASTY  11-13-1999   balloon angioplasty to mLAD , d2 of LAD  . CORONARY ANGIOPLASTY WITH STENT PLACEMENT  01-17-2000   PCI stenting to mLAD & D2 of LAD  . CORONARY ARTERY BYPASS GRAFT  07-02-2000   DR Tressie Stalker   SVG to diagonal, LIMA to LAD  . CYSTOSCOPY W/ RETROGRADES Bilateral 08/09/2014   Procedure: CYSTOSCOPY, BILATERAL RETROGRADE, HYDRODISTENSION, BLADDER BIOPSY WITH FULGERATION,  INSTILL PYRIDIUM AND MARCAINE ;  Surgeon: Anner Crete, MD;  Location: [facility];  Service: Urology;  Laterality: Bilateral;  . KNEE ARTHROSCOPY Left 10-18-2003   SYNOVECTOMY  . LUMBAR SPINE SURGERY  x2    yrs ago  . ORIF HUMERUS FRACTURE Right 04-18-2008  . REPAIR FLEXOR TENDON HAND Right 09-20-2008   CARPI RADIALIS TENDON TRANSFER TO EXTENSOR TO INDEX, MIDDLE, RING , LITTLE FINGERS AND DIGITI MININI  . ROTATOR CUFF REPAIR Right 1995  . TOTAL ABDOMINAL HYSTERECTOMY W/ BILATERAL SALPINGOOPHORECTOMY  age 34  . TRANSTHORACIC ECHOCARDIOGRAM  12-12-2013   MILD LVH/  EF 60-65%/  GRADE I DIASTOLIC DYSFUNCTION/  MILD LAE    OB History    Gravida Para Term Preterm AB Living   7 7 7     4    SAB TAB Ectopic Multiple Live Births                   Home Medications    Prior to Admission medications   Medication Sig Start Date End Date Taking? Authorizing Provider  albuterol (PROVENTIL) (2.5 MG/3ML) 0.083% nebulizer solution Take 3 mLs (2.5 mg total) by nebulization every 4 (four) hours as needed for wheezing or shortness of breath. 04/20/16  Yes Bethann Berkshire, MD  aspirin EC 325 MG tablet Take 325 mg by mouth daily.   Yes Historical Provider, MD  cephALEXin (KEFLEX) 500 MG capsule Take 1 capsule (500 mg total) by mouth 4 (four) times daily. 03/04/17  Yes Benjiman Core, MD  HYDROcodone-acetaminophen (NORCO) 10-325 MG tablet Take 1 tablet by mouth every 6 (six) hours as needed (chronic back pain).   Yes Historical Provider, MD  levothyroxine (SYNTHROID, LEVOTHROID) 25 MCG tablet Take 25 mcg by mouth every morning.    Yes Historical Provider, MD  lisinopril (PRINIVIL,ZESTRIL) 10 MG tablet Take 1 tablet by mouth every morning.  11/09/13  Yes Historical Provider, MD  LORazepam (ATIVAN) 2 MG tablet Take 1 tablet by mouth at bedtime. 07/26/16  Yes Historical Provider, MD  metFORMIN (GLUCOPHAGE) 500 MG tablet Take 500 mg by mouth 2 (two) times daily with a meal.     Yes Historical  Provider, MD  nitroGLYCERIN (NITROSTAT) 0.4 MG SL tablet Place 0.4 mg under the tongue every 5 (five) minutes as needed for chest pain.    Yes Historical Provider, MD  NOVOLIN 70/30 RELION (70-30) 100 UNIT/ML injection Inject 40 Units into the skin 2 (two) times daily with a meal.  02/25/16  Yes Historical Provider, MD  pravastatin (PRAVACHOL) 20 MG tablet Take 20 mg by mouth at bedtime.    Yes Historical Provider, MD  ciprofloxacin (CIPRO) 500 MG tablet Take 1 tablet (500 mg total) by mouth 2 (two) times daily. 03/11/17   Jacalyn Lefevre, MD  ferrous sulfate 325 (65 FE) MG tablet Take 1  tablet (325 mg total) by mouth 2 (two) times daily with a meal. Patient not taking: Reported on 03/04/2017 09/22/16   Oval Linseyichard [name], MD  Insulin Degludec (TRESIBA FLEXTOUCH) 200 UNIT/ML SOPN Inject 35-70 Units into the skin 2 (two) times daily. 35 units in the morning and 70 units in the evening    Historical Provider, MD    Family History Family History  Problem Relation Age of Onset  . Hypertension Mother   . Stroke Mother   . Coronary artery disease      Multiple first and second-degree relatives  . Aneurysm      Cerebral circulation  . Colon cancer Sister   . Stroke Sister   . Coronary artery disease Sister   . Clotting disorder      Children diagnosed with hypercoagulable state  . Coronary artery disease Brother     Social History Social History  Substance Use Topics  . Smoking status: Never Smoker  . Smokeless tobacco: Never Used  . Alcohol use No     Allergies   Codeine   Review of Systems Review of Systems  Genitourinary: Positive for dysuria.  Neurological: Positive for weakness.  All other systems reviewed and are negative.    Physical Exam Updated Vital Signs BP (!) 161/65   Pulse 79   Temp 98.4 F (36.9 C) (Oral)   Resp 20   Ht 5\' 5"  (1.651 m)   Wt 190 lb (86.2 kg)   SpO2 98%   BMI 31.62 kg/m   Physical Exam  Constitutional: She appears well-developed and  well-nourished.  HENT:  Head: Normocephalic and atraumatic.  Right Ear: External ear normal.  Left Ear: External ear normal.  Nose: Nose normal.  Mouth/Throat: Oropharynx is clear and moist.  Eyes: Conjunctivae and EOM are normal. Pupils are equal, round, and reactive to light.  Neck: Normal range of motion. Neck supple.  Cardiovascular: Normal rate, regular rhythm, normal heart sounds and intact distal pulses.   Pulmonary/Chest: Effort normal and breath sounds normal.  Abdominal: Soft. There is tenderness in the suprapubic area.  Skin: Skin is warm.  Psychiatric: She has a normal mood and affect. Her behavior is normal. Judgment and thought content normal.  Nursing note and vitals reviewed.    ED Treatments / Results  Labs (all labs ordered are listed, but only abnormal results are displayed) Labs Reviewed  COMPREHENSIVE METABOLIC PANEL - Abnormal; Notable for the following:       Result Value   Glucose, Bld 168 (*)    ALT 13 (*)    Total Bilirubin 0.2 (*)    All other components within normal limits  CBC - Abnormal; Notable for the following:    WBC 11.9 (*)    Hemoglobin 10.5 (*)    HCT 35.7 (*)    MCV 70.7 (*)    MCH 20.8 (*)    MCHC 29.4 (*)    RDW 18.1 (*)    All other components within normal limits  URINALYSIS, ROUTINE W REFLEX MICROSCOPIC - Abnormal; Notable for the following:    Color, Urine AMBER (*)    APPearance HAZY (*)    Glucose, UA 150 (*)    Hgb urine dipstick SMALL (*)    Nitrite POSITIVE (*)    Leukocytes, UA MODERATE (*)    All other components within normal limits  URINE CULTURE  TROPONIN I    EKG  EKG Interpretation  Date/Time:  Tuesday March 11 2017 14:53:07 EDT Ventricular Rate:  70  PR Interval:    QRS Duration: 107 QT Interval:  427 QTC Calculation: 461 R Axis:   -55 Text Interpretation:  Sinus or ectopic atrial rhythm Borderline prolonged PR interval Left anterior fascicular block Anterior infarct, old Borderline repolarization  abnormality No significant change since last tracing Confirmed by [facility] MD, Aubrina Nieman ([id number]) on 03/11/2017 2:55:56 PM       Radiology No results found.  Procedures Procedures (including critical care time)  Medications Ordered in ED Medications  levofloxacin (LEVAQUIN) IVPB 750 mg (750 mg Intravenous New Bag/Given 03/11/17 1543)  sodium chloride 0.9 % bolus 1,000 mL (1,000 mLs Intravenous New Bag/Given 03/11/17 1547)  ondansetron (ZOFRAN) injection 4 mg (4 mg Intravenous Given 03/11/17 1541)  HYDROcodone-acetaminophen (NORCO/VICODIN) 5-325 MG per tablet 2 tablet (2 tablets Oral Given 03/11/17 1633)     Initial Impression / Assessment and Plan / ED Course  I have reviewed the triage vital signs and the nursing notes.  Pertinent labs & imaging results that were available during my care of the patient were reviewed by me and considered in my medical decision making (see chart for details).    Pt given a dose of IV levaquin here.  Urine sent for culture.  Last culture from the 13th contaminated.  Pt told to f/u with urology due to frequent UTIs.  She is also told to f/u with pcp and to return if worse.  Final Clinical Impressions(s) / ED Diagnoses   Final diagnoses:  Acute cystitis without hematuria    New Prescriptions New Prescriptions   CIPROFLOXACIN (CIPRO) 500 MG TABLET    Take 1 tablet (500 mg total) by mouth 2 (two) times daily.     Jacalyn Lefevre, MD 03/11/17 [phone]

## 2017-03-13 LAB — URINE CULTURE

## 2017-05-10 ENCOUNTER — Inpatient Hospital Stay (HOSPITAL_COMMUNITY): Payer: Medicare HMO

## 2017-05-10 ENCOUNTER — Encounter (HOSPITAL_COMMUNITY): Payer: Self-pay

## 2017-05-10 ENCOUNTER — Inpatient Hospital Stay (HOSPITAL_COMMUNITY)
Admission: EM | Admit: 2017-05-10 | Discharge: 2017-05-13 | DRG: 056 | Disposition: A | Discharge status: Home Health | Payer: Medicare HMO | Attending: Neurology | Admitting: Neurology

## 2017-05-10 ENCOUNTER — Emergency Department (HOSPITAL_COMMUNITY): Payer: Medicare HMO

## 2017-05-10 DIAGNOSIS — R202 Paresthesia of skin: Secondary | ICD-10-CM | POA: Diagnosis present

## 2017-05-10 DIAGNOSIS — Z885 Allergy status to narcotic agent status: Secondary | ICD-10-CM

## 2017-05-10 DIAGNOSIS — N390 Urinary tract infection, site not specified: Secondary | ICD-10-CM | POA: Diagnosis present

## 2017-05-10 DIAGNOSIS — I2510 Atherosclerotic heart disease of native coronary artery without angina pectoris: Secondary | ICD-10-CM | POA: Diagnosis present

## 2017-05-10 DIAGNOSIS — Z9049 Acquired absence of other specified parts of digestive tract: Secondary | ICD-10-CM

## 2017-05-10 DIAGNOSIS — E1142 Type 2 diabetes mellitus with diabetic polyneuropathy: Secondary | ICD-10-CM | POA: Diagnosis present

## 2017-05-10 DIAGNOSIS — I5033 Acute on chronic diastolic (congestive) heart failure: Secondary | ICD-10-CM | POA: Diagnosis present

## 2017-05-10 DIAGNOSIS — L409 Psoriasis, unspecified: Secondary | ICD-10-CM | POA: Diagnosis present

## 2017-05-10 DIAGNOSIS — R2990 Unspecified symptoms and signs involving the nervous system: Secondary | ICD-10-CM

## 2017-05-10 DIAGNOSIS — E039 Hypothyroidism, unspecified: Secondary | ICD-10-CM | POA: Diagnosis present

## 2017-05-10 DIAGNOSIS — K5900 Constipation, unspecified: Secondary | ICD-10-CM | POA: Diagnosis not present

## 2017-05-10 DIAGNOSIS — Z9282 Status post administration of tPA (rtPA) in a different facility within the last 24 hours prior to admission to current facility: Secondary | ICD-10-CM

## 2017-05-10 DIAGNOSIS — G4733 Obstructive sleep apnea (adult) (pediatric): Secondary | ICD-10-CM | POA: Diagnosis present

## 2017-05-10 DIAGNOSIS — I252 Old myocardial infarction: Secondary | ICD-10-CM

## 2017-05-10 DIAGNOSIS — E669 Obesity, unspecified: Secondary | ICD-10-CM | POA: Diagnosis present

## 2017-05-10 DIAGNOSIS — R4789 Other speech disturbances: Secondary | ICD-10-CM

## 2017-05-10 DIAGNOSIS — G8929 Other chronic pain: Secondary | ICD-10-CM | POA: Diagnosis present

## 2017-05-10 DIAGNOSIS — R200 Anesthesia of skin: Secondary | ICD-10-CM

## 2017-05-10 DIAGNOSIS — E1159 Type 2 diabetes mellitus with other circulatory complications: Secondary | ICD-10-CM | POA: Diagnosis not present

## 2017-05-10 DIAGNOSIS — Z8673 Personal history of transient ischemic attack (TIA), and cerebral infarction without residual deficits: Secondary | ICD-10-CM | POA: Diagnosis not present

## 2017-05-10 DIAGNOSIS — Z961 Presence of intraocular lens: Secondary | ICD-10-CM | POA: Diagnosis present

## 2017-05-10 DIAGNOSIS — I2582 Chronic total occlusion of coronary artery: Secondary | ICD-10-CM | POA: Diagnosis present

## 2017-05-10 DIAGNOSIS — Z7982 Long term (current) use of aspirin: Secondary | ICD-10-CM

## 2017-05-10 DIAGNOSIS — D72829 Elevated white blood cell count, unspecified: Secondary | ICD-10-CM | POA: Diagnosis not present

## 2017-05-10 DIAGNOSIS — M545 Low back pain: Secondary | ICD-10-CM | POA: Diagnosis present

## 2017-05-10 DIAGNOSIS — I69398 Other sequelae of cerebral infarction: Principal | ICD-10-CM

## 2017-05-10 DIAGNOSIS — R29810 Facial weakness: Secondary | ICD-10-CM | POA: Diagnosis not present

## 2017-05-10 DIAGNOSIS — M5136 Other intervertebral disc degeneration, lumbar region: Secondary | ICD-10-CM | POA: Diagnosis not present

## 2017-05-10 DIAGNOSIS — R51 Headache: Secondary | ICD-10-CM | POA: Diagnosis present

## 2017-05-10 DIAGNOSIS — Z6833 Body mass index (BMI) 33.0-33.9, adult: Secondary | ICD-10-CM

## 2017-05-10 DIAGNOSIS — I110 Hypertensive heart disease with heart failure: Secondary | ICD-10-CM | POA: Diagnosis present

## 2017-05-10 DIAGNOSIS — R062 Wheezing: Secondary | ICD-10-CM

## 2017-05-10 DIAGNOSIS — E785 Hyperlipidemia, unspecified: Secondary | ICD-10-CM | POA: Diagnosis present

## 2017-05-10 DIAGNOSIS — N3000 Acute cystitis without hematuria: Secondary | ICD-10-CM | POA: Diagnosis not present

## 2017-05-10 DIAGNOSIS — Z951 Presence of aortocoronary bypass graft: Secondary | ICD-10-CM

## 2017-05-10 DIAGNOSIS — Z794 Long term (current) use of insulin: Secondary | ICD-10-CM | POA: Diagnosis not present

## 2017-05-10 DIAGNOSIS — I340 Nonrheumatic mitral (valve) insufficiency: Secondary | ICD-10-CM | POA: Diagnosis not present

## 2017-05-10 DIAGNOSIS — Z8249 Family history of ischemic heart disease and other diseases of the circulatory system: Secondary | ICD-10-CM

## 2017-05-10 DIAGNOSIS — Z955 Presence of coronary angioplasty implant and graft: Secondary | ICD-10-CM

## 2017-05-10 DIAGNOSIS — I639 Cerebral infarction, unspecified: Secondary | ICD-10-CM | POA: Diagnosis present

## 2017-05-10 DIAGNOSIS — J449 Chronic obstructive pulmonary disease, unspecified: Secondary | ICD-10-CM | POA: Diagnosis present

## 2017-05-10 DIAGNOSIS — I10 Essential (primary) hypertension: Secondary | ICD-10-CM | POA: Diagnosis not present

## 2017-05-10 DIAGNOSIS — Z87440 Personal history of urinary (tract) infections: Secondary | ICD-10-CM

## 2017-05-10 DIAGNOSIS — D638 Anemia in other chronic diseases classified elsewhere: Secondary | ICD-10-CM | POA: Diagnosis present

## 2017-05-10 LAB — I-STAT CHEM 8, ED
BUN: 17 mg/dL (ref 6–20)
CALCIUM ION: 1.18 mmol/L (ref 1.15–1.40)
Chloride: 103 mmol/L (ref 101–111)
Creatinine, Ser: 0.9 mg/dL (ref 0.44–1.00)
Glucose, Bld: 145 mg/dL — ABNORMAL HIGH (ref 65–99)
HEMATOCRIT: 31 % — AB (ref 36.0–46.0)
Hemoglobin: 10.5 g/dL — ABNORMAL LOW (ref 12.0–15.0)
Potassium: 3.8 mmol/L (ref 3.5–5.1)
SODIUM: 138 mmol/L (ref 135–145)
TCO2: 25 mmol/L (ref 0–100)

## 2017-05-10 LAB — DIFFERENTIAL
BASOS ABS: 0.1 10*3/uL (ref 0.0–0.1)
Basophils Relative: 1 %
Eosinophils Absolute: 0.7 10*3/uL (ref 0.0–0.7)
Eosinophils Relative: 6 %
LYMPHS ABS: 3.7 10*3/uL (ref 0.7–4.0)
LYMPHS PCT: 32 %
MONOS PCT: 10 %
Monocytes Absolute: 1.2 10*3/uL — ABNORMAL HIGH (ref 0.1–1.0)
NEUTROS PCT: 51 %
Neutro Abs: 5.8 10*3/uL (ref 1.7–7.7)

## 2017-05-10 LAB — COMPREHENSIVE METABOLIC PANEL
ALBUMIN: 3.8 g/dL (ref 3.5–5.0)
ALK PHOS: 66 U/L (ref 38–126)
ALT: 13 U/L — AB (ref 14–54)
AST: 16 U/L (ref 15–41)
Anion gap: 9 (ref 5–15)
BILIRUBIN TOTAL: 0.3 mg/dL (ref 0.3–1.2)
BUN: 17 mg/dL (ref 6–20)
CALCIUM: 9.4 mg/dL (ref 8.9–10.3)
CO2: 26 mmol/L (ref 22–32)
CREATININE: 0.86 mg/dL (ref 0.44–1.00)
Chloride: 104 mmol/L (ref 101–111)
GFR calc Af Amer: >60 mL/min (ref >60)
GFR calc non Af Amer: >60 mL/min (ref >60)
GLUCOSE: 149 mg/dL — AB (ref 65–99)
Potassium: 3.9 mmol/L (ref 3.5–5.1)
Sodium: 139 mmol/L (ref 135–145)
TOTAL PROTEIN: 6.8 g/dL (ref 6.5–8.1)

## 2017-05-10 LAB — ETHANOL: Alcohol, Ethyl (B): <5 mg/dL (ref <5)

## 2017-05-10 LAB — URINALYSIS, ROUTINE W REFLEX MICROSCOPIC
BILIRUBIN URINE: NEGATIVE
GLUCOSE, UA: NEGATIVE mg/dL
KETONES UR: NEGATIVE mg/dL
NITRITE: POSITIVE — AB
PH: 5 (ref 5.0–8.0)
Protein, ur: NEGATIVE mg/dL
Specific Gravity, Urine: 1.01 (ref 1.005–1.030)

## 2017-05-10 LAB — CBC
HEMATOCRIT: 32.2 % — AB (ref 36.0–46.0)
Hemoglobin: 9.3 g/dL — ABNORMAL LOW (ref 12.0–15.0)
MCH: 20 pg — ABNORMAL LOW (ref 26.0–34.0)
MCHC: 28.9 g/dL — ABNORMAL LOW (ref 30.0–36.0)
MCV: 69.2 fL — ABNORMAL LOW (ref 78.0–100.0)
Platelets: 371 10*3/uL (ref 150–400)
RBC: 4.65 MIL/uL (ref 3.87–5.11)
RDW: 18.4 % — ABNORMAL HIGH (ref 11.5–15.5)
WBC: 11.4 10*3/uL — ABNORMAL HIGH (ref 4.0–10.5)

## 2017-05-10 LAB — I-STAT TROPONIN, ED: Troponin i, poc: 0.03 ng/mL (ref 0.00–0.08)

## 2017-05-10 LAB — GLUCOSE, CAPILLARY
GLUCOSE-CAPILLARY: 120 mg/dL — AB (ref 65–99)
Glucose-Capillary: 81 mg/dL (ref 65–99)
Glucose-Capillary: 143 mg/dL — ABNORMAL HIGH (ref 65–99)

## 2017-05-10 LAB — PROTIME-INR
INR: 1.09
Prothrombin Time: 14.2 seconds (ref 11.4–15.2)

## 2017-05-10 LAB — CBG MONITORING, ED: Glucose-Capillary: 136 mg/dL — ABNORMAL HIGH (ref 65–99)

## 2017-05-10 LAB — MRSA PCR SCREENING: MRSA BY PCR: NEGATIVE

## 2017-05-10 LAB — APTT: APTT: 32 s (ref 24–36)

## 2017-05-10 MED ORDER — ACETAMINOPHEN 650 MG RE SUPP: 650.0000 mg | RECTAL | Status: DC | PRN

## 2017-05-10 MED ORDER — NICARDIPINE HCL IN NACL 20-0.86 MG/200ML-% IV SOLN: 0.0000 mg/h | INTRAVENOUS | Status: DC

## 2017-05-10 MED ORDER — SODIUM CHLORIDE 0.9 % IV SOLN: 50.0000 mL | Freq: Once | INTRAVENOUS | Status: DC

## 2017-05-10 MED ORDER — LABETALOL HCL 5 MG/ML IV SOLN: 20.0000 mg | Freq: Once | INTRAVENOUS | Status: DC

## 2017-05-10 MED ORDER — LEVOTHYROXINE SODIUM 25 MCG PO TABS
25.0000 ug | ORAL_TABLET | Freq: Every day | ORAL | Status: DC
  Administered 2017-05-11 – 2017-05-13 (×3): 25 ug via ORAL
  Filled 2017-05-10 (×3): qty 1

## 2017-05-10 MED ORDER — SODIUM CHLORIDE 0.9 % IV SOLN
INTRAVENOUS | Status: DC
  Administered 2017-05-10 – 2017-05-12 (×2): via INTRAVENOUS

## 2017-05-10 MED ORDER — ACETAMINOPHEN 325 MG PO TABS
650.0000 mg | ORAL_TABLET | ORAL | Status: DC | PRN
  Administered 2017-05-10 – 2017-05-11 (×2): 650 mg via ORAL
  Filled 2017-05-10 (×3): qty 2

## 2017-05-10 MED ORDER — SENNOSIDES-DOCUSATE SODIUM 8.6-50 MG PO TABS
1.0000 | ORAL_TABLET | Freq: Every evening | ORAL | Status: DC | PRN
  Administered 2017-05-12 – 2017-05-13 (×2): 1 via ORAL
  Filled 2017-05-10 (×2): qty 1

## 2017-05-10 MED ORDER — PANTOPRAZOLE SODIUM 40 MG IV SOLR
40.0000 mg | Freq: Every day | INTRAVENOUS | Status: DC
  Administered 2017-05-10: 40 mg via INTRAVENOUS
  Filled 2017-05-10: qty 40

## 2017-05-10 MED ORDER — FENTANYL CITRATE (PF) 100 MCG/2ML IJ SOLN
25.0000 ug | Freq: Once | INTRAMUSCULAR | Status: AC
  Administered 2017-05-10: 25 ug via INTRAVENOUS
  Filled 2017-05-10: qty 2

## 2017-05-10 MED ORDER — ALTEPLASE (STROKE) FULL DOSE INFUSION
0.9000 mg/kg | Freq: Once | INTRAVENOUS | Status: AC
  Administered 2017-05-10: 78 mg via INTRAVENOUS

## 2017-05-10 MED ORDER — FENTANYL CITRATE (PF) 100 MCG/2ML IJ SOLN
25.0000 ug | INTRAMUSCULAR | Status: DC | PRN
  Administered 2017-05-10: 25 ug via INTRAVENOUS
  Administered 2017-05-10 – 2017-05-11 (×2): 50 ug via INTRAVENOUS
  Filled 2017-05-10 (×4): qty 2

## 2017-05-10 MED ORDER — HYDROCODONE-ACETAMINOPHEN 10-325 MG PO TABS
1.0000 | ORAL_TABLET | Freq: Four times a day (QID) | ORAL | Status: DC | PRN
  Administered 2017-05-10 – 2017-05-13 (×6): 1 via ORAL
  Filled 2017-05-10 (×6): qty 1

## 2017-05-10 MED ORDER — STROKE: EARLY STAGES OF RECOVERY BOOK
Freq: Once | Status: AC
  Administered 2017-05-10: 1
  Filled 2017-05-10: qty 1

## 2017-05-10 MED ORDER — ONDANSETRON HCL 4 MG/2ML IJ SOLN
4.0000 mg | Freq: Once | INTRAMUSCULAR | Status: AC
  Administered 2017-05-10: 4 mg via INTRAVENOUS

## 2017-05-10 MED ORDER — ALTEPLASE 100 MG IV SOLR
INTRAVENOUS | Status: AC
  Administered 2017-05-10: 78 mg via INTRAVENOUS
  Filled 2017-05-10: qty 100

## 2017-05-10 MED ORDER — LORAZEPAM 1 MG PO TABS
1.0000 mg | ORAL_TABLET | Freq: Every evening | ORAL | Status: DC | PRN
  Administered 2017-05-10 – 2017-05-12 (×3): 1 mg via ORAL
  Filled 2017-05-10 (×3): qty 1

## 2017-05-10 MED ORDER — ONDANSETRON HCL 4 MG/2ML IJ SOLN
INTRAMUSCULAR | Status: AC
  Filled 2017-05-10: qty 2

## 2017-05-10 MED ORDER — ACETAMINOPHEN 160 MG/5ML PO SOLN: 650.0000 mg | ORAL | Status: DC | PRN

## 2017-05-10 NOTE — Evaluation (Signed)
Clinical/Bedside Swallow Evaluation Patient Details  Name: [name] [name] MRN: [id number] Date of Birth: 1936-09-10  Today's Date: 05/10/2017 Time: SLP Start Time (ACUTE ONLY): 1530 SLP Stop Time (ACUTE ONLY): 1545 SLP Time Calculation (min) (ACUTE ONLY): 15 min  Past Medical History:  Past Medical History:  Diagnosis Date  . Arthritis   . CAD (coronary artery disease)    Multivessel status post CABG 2001  . Chronic obstructive pulmonary disease (HCC)   . DDD (degenerative disc disease), lumbar   . Diastolic CHF (HCC)   . Essential hypertension   . Hematuria   . History of CVA (cerebrovascular accident)    2013 - lacunar infarct right thalamus - residual left side of  lip numb  and left eye vision worse (which corrented lens implant from cataract extraction)  . History of MI (myocardial infarction)    July 2001  . Hyperlipidemia   . Hypothyroidism   . Insulin dependent type 2 diabetes mellitus (HCC)   . Lesion of bladder   . Lower urinary tract symptoms (LUTS)   . OSA (obstructive sleep apnea)   . Peripheral neuropathy   . Psoriasis   . SUI (stress urinary incontinence, female)   . Varicose veins    Past Surgical History:  Past Surgical History:  Procedure Laterality Date  . APPENDECTOMY  age 43  . CARDIAC CATHETERIZATION  1991   No sig. cad  . CARDIAC CATHETERIZATION  07-01-2000   high grade in-stent restenosis  . CARDIAC CATHETERIZATION  04-30-2001   single native vessel cad/ graft patent  . CARDIAC CATHETERIZATION  02-13-2006  dr Jenness [place] cad with total occluded LAD  and  70% ostial of small ramus/  grafts patent/  normal ef  . CARDIAC CATHETERIZATION  01-20-2000  dr Clifton James   essentially unchanged;  severe single vessel cad with diffuse disease throughout CFX and RCA/ ef 66%;  chf with preserved LVSF  . CARDIOVASCULAR STRESS TEST  08-28-2011   DR ROTHBART   NEGATIVE NUCLEAR STUDY/  GLOBAL LVSF/  EF 65%  . CARPAL TUNNEL RELEASE Bilateral yrs ago  . CATARACT  EXTRACTION W/ INTRAOCULAR LENS  IMPLANT, BILATERAL  2013  . CHOLECYSTECTOMY  1990  . COLONOSCOPY N/A 07/26/2015   Procedure: COLONOSCOPY;  Surgeon: Malissa Hippo, MD;  Location: [facility];  Service: Endoscopy;  Laterality: N/A;  1200  . CORONARY ANGIOPLASTY  11-13-1999   balloon angioplasty to mLAD , d2 of LAD  . CORONARY ANGIOPLASTY WITH STENT PLACEMENT  01-17-2000   PCI stenting to mLAD & D2 of LAD  . CORONARY ARTERY BYPASS GRAFT  07-02-2000   DR Tressie Stalker   SVG to diagonal, LIMA to LAD  . CYSTOSCOPY W/ RETROGRADES Bilateral 08/09/2014   Procedure: CYSTOSCOPY, BILATERAL RETROGRADE, HYDRODISTENSION, BLADDER BIOPSY WITH FULGERATION, INSTILL PYRIDIUM AND MARCAINE ;  Surgeon: Anner Crete, MD;  Location: [facility];  Service: Urology;  Laterality: Bilateral;  . KNEE ARTHROSCOPY Left 10-18-2003   SYNOVECTOMY  . LUMBAR SPINE SURGERY  x2    yrs ago  . ORIF HUMERUS FRACTURE Right 04-18-2008  . REPAIR FLEXOR TENDON HAND Right 09-20-2008   CARPI RADIALIS TENDON TRANSFER TO EXTENSOR TO INDEX, MIDDLE, RING , LITTLE FINGERS AND DIGITI MININI  . ROTATOR CUFF REPAIR Right 1995  . TOTAL ABDOMINAL HYSTERECTOMY W/ BILATERAL SALPINGOOPHORECTOMY  age 42  . TRANSTHORACIC ECHOCARDIOGRAM  12-12-2013   MILD LVH/  EF 60-65%/  GRADE I DIASTOLIC DYSFUNCTION/  MILD LAE   HPI:  Pt is a 81 y.o. female with PMH of stroke in 2013, admitted on 5/19 c/o L facial weakness, headache, dizziness, and L facial numbness. MD reported mild confusion. No acute findings on Head CT; did show moderate chronic ischemic microvascular disease. Pt did not pass RN stroke swallow screen; following trials pt stated "throat feels funny". Bedside swallow eval ordered.    Assessment / Plan / Recommendation Clinical Impression  Pt coughed x1 following initial trial of regular solid; no other s/s of aspiration noted on subsequent trials of regular solid or other consistencies. Pt continues to report numbness on L side;  however, this did not result in any pocketing of POs. Aspiration risk appears mild at this time. Recommend initiating regular diet, thin liquids, meds whole with liquid, intermittent supervision to ensure pt seated upright/ taking small bites and sips. Will sign off on swallow orders but continue to follow for cognitive-linguistic evaluation. SLP Visit Diagnosis: Dysphagia, unspecified (R13.10)    Aspiration Risk  Mild aspiration risk    Diet Recommendation Regular;Thin liquid   Liquid Administration via: Cup;Straw Medication Administration: Whole meds with liquid Supervision: Patient able to self feed;Intermittent supervision to cue for compensatory strategies Compensations: Slow rate;Small sips/bites Postural Changes: Seated upright at 90 degrees    Other  Recommendations Oral Care Recommendations: Oral care BID   Follow up Recommendations Other (comment) (TBD pending cog-linguistic eval)      Frequency and Duration            Prognosis        Swallow Study   General HPI: Pt is a 81 y.o. female with PMH of stroke in 2013, admitted on 5/19 c/o L facial weakness, headache, dizziness, and L facial numbness. MD reported mild confusion. No acute findings on Head CT; did show moderate chronic ischemic microvascular disease. Pt did not pass RN stroke swallow screen; following trials pt stated "throat feels funny". Bedside swallow eval ordered.  Type of Study: Bedside Swallow Evaluation Previous Swallow Assessment:  (none in chart) Diet Prior to this Study: NPO Temperature Spikes Noted: No Respiratory Status: Nasal cannula History of Recent Intubation: No Behavior/Cognition: Alert;Cooperative Oral Cavity Assessment: Within Functional Limits Oral Cavity - Dentition: Adequate natural dentition Vision: Functional for self-feeding Self-Feeding Abilities: Able to feed self Patient Positioning: Upright in bed Baseline Vocal Quality: Normal Volitional Cough: Strong Volitional Swallow:  Able to elicit    Oral/Motor/Sensory Function Overall Oral Motor/Sensory Function: Mild impairment Facial ROM: Within Functional Limits Facial Symmetry: Within Functional Limits Facial Strength: Within Functional Limits Facial Sensation: Reduced left Lingual ROM: Within Functional Limits Lingual Symmetry: Within Functional Limits   Ice Chips Ice chips: Not tested   Thin Liquid Thin Liquid: Within functional limits    Nectar Thick Nectar Thick Liquid: Not tested   Honey Thick Honey Thick Liquid: Not tested   Puree Puree: Within functional limits   Solid   GO   Other Comments:  (cough x1, otherwise WFL)        Amy Cecille AverK Oleksiak, MA, CCC-SLP 05/10/2017,3:56 PM

## 2017-05-10 NOTE — ED Triage Notes (Signed)
Pt reports 1 hour ago she started feeling numbness to bottom lip and left side of her throat.  Pt says her left eye "feels funny" and her head "feels foolish."  Pt also says her left arm feels different.  Pt able to move all extremities.  Reports headache and dizziness as well.  Dr. Rubin PayorPickering notified and is at bedside with pt.

## 2017-05-10 NOTE — ED Provider Notes (Signed)
[facility] DEPT Provider Note   CSN: [id number] Arrival date & time: 05/10/17  1327   By signing my name below, I, Hilbert Odor, attest that this documentation has been prepared under the direction and in the presence of Davonna Belling, MD. Electronically Signed: Hilbert Odor, Scribe. 05/10/17. 2:04 PM. History   Chief Complaint Chief Complaint  Patient presents with  . Stroke Symptoms   LEVEL 5 CAVEAT: Stroke-like symptoms The history is provided by the patient. No language interpreter was used.  HPI Comments: [name] [name] is a 81 y.o. female with hx of stroke in 2013 who presents to the Emergency Department complaining of left face weakness that began a few hours ago. The patient also reports and associated headache, dizziness, and left face numbness. She reports current numbness from the top of her face down to the bottom of her neck. Per family: The patient told the family that her left eye "feels funny". The patient also states that her left arm feels different. The patient denies any fevers.  Past Medical History:  Diagnosis Date  . Arthritis   . CAD (coronary artery disease)    Multivessel status post CABG 2001  . Chronic obstructive pulmonary disease ([place])   . DDD (degenerative disc disease), lumbar   . Diastolic CHF ([place])   . Essential hypertension   . Hematuria   . History of CVA (cerebrovascular accident)    2013 - lacunar infarct right thalamus - residual left side of  lip numb  and left eye vision worse (which corrented lens implant from cataract extraction)  . History of MI (myocardial infarction)    July 2001  . Hyperlipidemia   . Hypothyroidism   . Insulin dependent type 2 diabetes mellitus ([place])   . Lesion of bladder   . Lower urinary tract symptoms (LUTS)   . OSA (obstructive sleep apnea)   . Peripheral neuropathy   . Psoriasis   . SUI (stress urinary incontinence, female)   . Varicose veins     Patient Active Problem List   Diagnosis  Date Noted  . Ischemic stroke ([place]) 05/10/2017  . Acute ischemic stroke ([place]) 05/10/2017  . Stroke ([place]) 08/01/2016  . Dysarthria 03/18/2016  . Influenza-like illness 11/19/2015  . Anemia of chronic disease 11/19/2015  . OSA (obstructive sleep apnea) 11/19/2015  . Left facial numbness 08/29/2015  . Gait instability 06/26/2015  . Bronchitis 11/13/2014  . COPD exacerbation ([place]) 11/13/2014  . UTI (urinary tract infection) 08/26/2014  . Near syncope 08/24/2014  . UTI (lower urinary tract infection) 12/10/2013  . Hypoxia 12/10/2013  . Weakness generalized 12/10/2013  . TIA (transient ischemic attack) 06/22/2012  . Left-sided headache 06/22/2012  . Noncompliance 06/22/2012  . Noncompliance with CPAP treatment 06/22/2012  . Psoriasis 01/07/2012  . Cerebrovascular disease   . Chronic back pain   . Chronic obstructive pulmonary disease ([place])   . Chest pain 08/28/2011  . DM type 2 causing complication ([place]) 21/19/4174  . Obesity 02/14/2010  . Arteriosclerotic cardiovascular disease (ASCVD) 02/14/2010  . Hypothyroidism 02/06/2010  . Hyperlipidemia 02/06/2010  . Essential hypertension 02/06/2010  . Sleep apnea 02/06/2010    Past Surgical History:  Procedure Laterality Date  . APPENDECTOMY  age 42  . CARDIAC CATHETERIZATION  1991   No sig. cad  . CARDIAC CATHETERIZATION  07-01-2000   high grade in-stent restenosis  . CARDIAC CATHETERIZATION  04-30-2001   single native vessel cad/ graft patent  . CARDIAC CATHETERIZATION  02-13-2006  dr Danae Orleans  cad with total occluded LAD  and  70% ostial of small ramus/  grafts patent/  normal ef  . CARDIAC CATHETERIZATION  01-20-2000  dr Angelena Form   essentially unchanged;  severe single vessel cad with diffuse disease throughout CFX and RCA/ ef 66%;  chf with preserved LVSF  . CARDIOVASCULAR STRESS TEST  08-28-2011   DR ROTHBART   NEGATIVE NUCLEAR STUDY/  GLOBAL LVSF/  EF 65%  . CARPAL TUNNEL RELEASE Bilateral yrs ago  . CATARACT EXTRACTION  W/ INTRAOCULAR LENS  IMPLANT, BILATERAL  2013  . CHOLECYSTECTOMY  1990  . COLONOSCOPY N/A 07/26/2015   Procedure: COLONOSCOPY;  Surgeon: Rogene Houston, MD;  Location: [facility];  Service: Endoscopy;  Laterality: N/A;  1200  . CORONARY ANGIOPLASTY  11-13-1999   balloon angioplasty to mLAD , d2 of LAD  . CORONARY ANGIOPLASTY WITH STENT PLACEMENT  01-17-2000   PCI stenting to mLAD & D2 of LAD  . CORONARY ARTERY BYPASS GRAFT  07-02-2000   DR Darylene Price   SVG to diagonal, LIMA to LAD  . CYSTOSCOPY W/ RETROGRADES Bilateral 08/09/2014   Procedure: CYSTOSCOPY, BILATERAL RETROGRADE, HYDRODISTENSION, BLADDER BIOPSY WITH FULGERATION, INSTILL PYRIDIUM AND MARCAINE ;  Surgeon: Malka So, MD;  Location: [facility];  Service: Urology;  Laterality: Bilateral;  . KNEE ARTHROSCOPY Left 10-18-2003   SYNOVECTOMY  . LUMBAR SPINE SURGERY  x2    yrs ago  . ORIF HUMERUS FRACTURE Right 04-18-2008  . REPAIR FLEXOR TENDON HAND Right 09-20-2008   CARPI RADIALIS TENDON TRANSFER TO EXTENSOR TO INDEX, MIDDLE, RING , LITTLE FINGERS AND DIGITI MININI  . ROTATOR CUFF REPAIR Right 1995  . TOTAL ABDOMINAL HYSTERECTOMY W/ BILATERAL SALPINGOOPHORECTOMY  age 22  . TRANSTHORACIC ECHOCARDIOGRAM  12-12-2013   MILD LVH/  EF 76-16%/  GRADE I DIASTOLIC DYSFUNCTION/  MILD LAE    OB History    Gravida Para Term Preterm AB Living   _0 SAB TAB Ectopic Multiple Live Births                   Home Medications    Prior to Admission medications   Medication Sig Start Date End Date Taking? Authorizing Provider  aspirin EC 325 MG tablet Take 325 mg by mouth daily.   Yes [provider]  HYDROcodone-acetaminophen (NORCO) 10-325 MG tablet Take 1 tablet by mouth every 6 (six) hours as needed (chronic back pain).   Yes [provider]  insulin NPH-regular Human (NOVOLIN 70/30) (70-30) 100 UNIT/ML injection Inject 40 Units into the skin 2 (two) times daily.   Yes [provider]  levothyroxine (SYNTHROID, LEVOTHROID) 25 MCG tablet Take 25 mcg by mouth daily.    Yes [provider]  lisinopril (PRINIVIL,ZESTRIL) 10 MG tablet Take 1 tablet by mouth daily.  11/09/13  Yes [provider]  LORazepam (ATIVAN) 1 MG tablet Take 1 mg by mouth at bedtime as needed for sleep.    Yes [provider]  metFORMIN (GLUCOPHAGE) 500 MG tablet Take 500 mg by mouth 2 (two) times daily with a meal.     Yes [provider]  naproxen sodium (ALEVE) 220 MG tablet Take 220 mg by mouth 2 (two) times daily as needed (pain/headache).   Yes [provider]  nitroGLYCERIN (NITROSTAT) 0.4 MG SL tablet Place 0.4 mg under the tongue every 5 (five) minutes as needed for chest pain.    Yes [provider]  OVER THE COUNTER MEDICATION Place 1 drop into both eyes 3 (three) times daily as needed (eye drops). OTC lubricating eye drops   Yes [provider]  OVER THE COUNTER MEDICATION Apply 1 application topically 2 (two) times daily as needed (itching/psoriasis flares). OTC Psoriasis cream   Yes [provider]  pravastatin (PRAVACHOL) 20 MG tablet Take 20 mg by mouth at bedtime.    Yes [provider]  sulfamethoxazole-trimethoprim (BACTRIM DS,SEPTRA DS) 800-160 MG tablet Take 1 tablet by mouth daily. Continuous course for UTI prevention 04/29/17  Yes [provider]  albuterol (PROVENTIL) (2.5 MG/3ML) 0.083% nebulizer solution Take 3 mLs (2.5 mg total) by nebulization every 4 (four) hours as needed for wheezing or shortness of breath. Patient not taking: Reported on 05/10/2017 04/20/16   Milton Ferguson, MD  ferrous sulfate 325 (65 FE) MG tablet Take 1 tablet (325 mg total) by mouth 2 (two) times daily with a meal. Patient not taking: Reported on 03/04/2017 09/22/16   Lucia Gaskins, MD    Family History Family History  Problem Relation Age of Onset  . Hypertension Mother   . Stroke Mother   . Coronary artery disease  Unknown        Multiple first and second-degree relatives  . Aneurysm Unknown        Cerebral circulation  . Colon cancer Sister   . Stroke Sister   . Coronary artery disease Sister   . Clotting disorder Unknown        Children diagnosed with hypercoagulable state  . Coronary artery disease Brother     Social History Social History  Substance Use Topics  . Smoking status: Never Smoker  . Smokeless tobacco: Never Used  . Alcohol use No     Allergies   Codeine   Review of Systems Review of Systems  Unable to perform ROS: Other (Stroke-like symtoms)   Physical Exam Updated Vital Signs BP (!) 116/50   Pulse (!) 58   Temp 97.7 F (36.5 C) (Oral)   Resp 19   Ht _0  (1.626 m)   Wt 195 lb 5.2 oz (88.6 kg)   SpO2 100%   BMI 33.53 kg/m   Physical Exam  Constitutional: She is oriented to person, place, and time. She appears well-developed and well-nourished.  HENT:  Head: Normocephalic.  Eyes: EOM are normal.  Eye movement is intact. Pupils are reactive. Possible decreased vision in left eye.  Neck: Normal range of motion.  Pulmonary/Chest: Effort normal.  Abdominal: She exhibits no distension.  Musculoskeletal: Normal range of motion.  Neurological: She is alert and oriented to person, place, and time.  Previous strokes. She is away but has mild confusion. Some paresthesia on the left side of her face. Decreased sensation on the left side. Good grip strength bilaterally.  Psychiatric: She has a normal mood and affect.  Nursing note and vitals reviewed.  ED Treatments / Results  COORDINATION OF CARE: 1:36 PM Discussed treatment plan with pt and family at bedside and they agreed to plan. I will check her labs and CT scan.  Labs (all labs ordered are listed, but only abnormal results are displayed) Labs Reviewed  CBC - Abnormal; Notable for the following:       Result Value   WBC 11.4 (*)    Hemoglobin 9.3 (*)    HCT 32.2 (*)    MCV 69.2 (*)    MCH 20.0 (*)     MCHC 28.9 (*)  RDW 18.4 (*)    All other components within normal limits  DIFFERENTIAL - Abnormal; Notable for the following:    Monocytes Absolute 1.2 (*)    All other components within normal limits  COMPREHENSIVE METABOLIC PANEL - Abnormal; Notable for the following:    Glucose, Bld 149 (*)    ALT 13 (*)    All other components within normal limits  GLUCOSE, CAPILLARY - Abnormal; Notable for the following:    Glucose-Capillary 143 (*)    All other components within normal limits  I-STAT CHEM 8, ED - Abnormal; Notable for the following:    Glucose, Bld 145 (*)    Hemoglobin 10.5 (*)    HCT 31.0 (*)    All other components within normal limits  CBG MONITORING, ED - Abnormal; Notable for the following:    Glucose-Capillary 136 (*)    All other components within normal limits  MRSA PCR SCREENING  ETHANOL  PROTIME-INR  APTT  GLUCOSE, CAPILLARY  RAPID URINE DRUG SCREEN, HOSP PERFORMED  URINALYSIS, ROUTINE W REFLEX MICROSCOPIC  HEMOGLOBIN A1C  LIPID PANEL  I-STAT TROPOININ, ED    EKG  EKG Interpretation None       Radiology Ct Head Wo Contrast  Result Date: 05/10/2017 CLINICAL DATA:  Pt has just arrived here from [facility] with neurological status alteration EXAM: CT HEAD WITHOUT CONTRAST TECHNIQUE: Contiguous axial images were obtained from the base of the skull through the vertex without intravenous contrast. COMPARISON:  05/10/2017 at 1342 hours FINDINGS: Brain: No evidence of acute infarction, hemorrhage, hydrocephalus, extra-axial collection or mass lesion/mass effect. There is age related volume loss. Patchy white matter hypoattenuation is noted consistent with moderate chronic microvascular ischemic change. Vascular: No hyperdense vessel or unexpected calcification. Skull: Normal. Negative for fracture or focal lesion. Sinuses/Orbits: Globes and orbits are unremarkable. Visualized sinuses are clear. Clear mastoid air cells. Other: No change from the earlier study.  IMPRESSION: 1. No acute intracranial abnormalities. Stable appearance from the study obtained earlier today. Electronically Signed   By: Lajean Manes M.D.   On: 05/10/2017 16:37   Ct Head Wo Contrast  Result Date: 05/10/2017 CLINICAL DATA:  Left facial numbness and vision loss. Left-sided headache and nausea beginning today. Code stroke. EXAM: CT HEAD WITHOUT CONTRAST TECHNIQUE: Contiguous axial images were obtained from the base of the skull through the vertex without intravenous contrast. COMPARISON:  09/11/2016 FINDINGS: Brain: The ventricles, cisterns and other CSF spaces are within normal. There is moderate chronic ischemic microvascular disease. There is no mass, mass effect, shift of midline structures or acute hemorrhage. There is no evidence to suggest acute infarction. Bilateral basal ganglia calcifications. Vascular: No hyperdense vessel or unexpected calcification. Skull: Within normal. Sinuses/Orbits: Within normal. Other: None. IMPRESSION: No acute intracranial findings. Moderate chronic ischemic microvascular disease. These results were called by telephone at the time of interpretation on 05/10/2017 at 1:55 pm to Dr. Rogene Houston, who verbally acknowledged these results. Electronically Signed   By: Marin Olp M.D.   On: 05/10/2017 13:55    Procedures Procedures (including critical care time)  Medications Ordered in ED Medications  ondansetron (ZOFRAN) 4 MG/2ML injection (not administered)  alteplase (ACTIVASE) 1 mg/mL infusion 78 mg (0 mg Intravenous Stopped 05/10/17 1502)    Followed by  0.9 %  sodium chloride infusion (not administered)  0.9 %  sodium chloride infusion ( Intravenous Rate/Dose Verify 05/10/17 1900)  acetaminophen (TYLENOL) tablet 650 mg (not administered)    Or  acetaminophen (TYLENOL) solution 650 mg (  not administered)    Or  acetaminophen (TYLENOL) suppository 650 mg (not administered)  senna-docusate (Senokot-S) tablet 1 tablet (not administered)  pantoprazole  (PROTONIX) injection 40 mg (not administered)  labetalol (NORMODYNE,TRANDATE) injection 20 mg (20 mg Intravenous Not Given 05/10/17 1707)    And  nicardipine (CARDENE) 7m in 0.86% saline 204mIV infusion (0.1 mg/ml) (0 mg/hr Intravenous Not Given 05/10/17 1600)  levothyroxine (SYNTHROID, LEVOTHROID) tablet 25 mcg (not administered)  LORazepam (ATIVAN) tablet 1 mg (not administered)  fentaNYL (SUBLIMAZE) injection 25-50 mcg (not administered)  HYDROcodone-acetaminophen (NORCO) 10-325 MG per tablet 1 tablet (1 tablet Oral Given 05/10/17 1937)  ondansetron (ZOFRAN) injection 4 mg (4 mg Intravenous Given 05/10/17 1355)  fentaNYL (SUBLIMAZE) injection 25 mcg (25 mcg Intravenous Given 05/10/17 1430)   stroke: mapping our early stages of recovery book (1 each Does not apply Given 05/10/17 1600)     Initial Impression / Assessment and Plan / ED Course  I have reviewed the triage vital signs and the nursing notes.  Pertinent labs & imaging results that were available during my care of the patient were reviewed by me and considered in my medical decision making (see chart for details).     Patient with apparent ischemic stroke. Met upon my arrival by me. Code stroke called. Seen by specialist on-call recommended TPA. [facility]  CRITICAL CARE Performed by: PIMackie Paiotal critical care time: 30 minutes Critical care time was exclusive of separately billable procedures and treating other patients. Critical care was necessary to treat or prevent imminent or life-threatening deterioration. Critical care was time spent personally by me on the following activities: development of treatment plan with patient and/or surrogate as well as nursing, discussions with consultants, evaluation of patient's response to treatment, examination of patient, obtaining history from patient or surrogate, ordering and performing treatments  and interventions, ordering and review of laboratory studies, ordering and review of radiographic studies, pulse oximetry and re-evaluation of patient's condition.   Final Clinical Impressions(s) / ED Diagnoses   Final diagnoses:  [facility]   New Prescriptions Current Discharge Medication List     I personally performed the services described in this documentation, which was scribed in my presence. The recorded information has been reviewed and is accurate.      PiDavonna BellingMD 05/10/17 2024

## 2017-05-10 NOTE — ED Notes (Signed)
Primary nurse Gearldine BienenstockBrandy called me into room to assess IV.  22G in right forearm noted to be infiltrated with quarter sized swelling and bruising noted.  Per [facility] leave intact due to TPA administration.  Infusion changed to second IV site.

## 2017-05-10 NOTE — Progress Notes (Signed)
CODE STROKE 1334 BEEPER 1342 EXAM STARTED 1346 EXAM FINISHED  1346 IMAGES SENT TO SOC 1348 EXAM COMPLETED IN EPIC 1349 [facility] CALLED

## 2017-05-10 NOTE — ED Notes (Addendum)
O2 applied via [place] at 2 L/M 

## 2017-05-10 NOTE — H&P (Signed)
Neurology Admission H&P   CC: Numbness of the left face and arm, difficulty getting words out  HPI: This is a 80-yo RH woman who presented to [facility] ED at 1330 today. She complained of numbness of the left face and arm associated with headache and dizziness and a "funny" feeling in her left eye. On ED MD exam, she was noted to have mild confusion with some paresthesias on the left side of her face and decreased sensation on the left side. She had good grip strength bilaterally. Teleneurology consultation was obtained and advised CODE STROKE activation. [facility]. She had a reported NIHSS score of 7. Blood pressure was never elevated with SBP 95-116 at [facility]. The decision was made to treat with tPA and she was given a total dose of 78 mg with 7.8 mg bolus at 1402 followed by infusion of 70.2 mg over the next hour. [facility] and further management.   [facility], transport RN called the receiving RN and reported that the patient complained of a worsening headache. This has since improved. She still has some moderate headache and says that it is hard for her to think. She continues to have numbness in her left face and hand and feels like she is having trouble getting her words out.   Last known well: about 1230 today NHISS score: 4 tPA given?: Yes, [facility].    PMH:  Past Medical History:  Diagnosis Date  . Arthritis   . CAD (coronary artery disease)    Multivessel status post CABG 2001  . Chronic obstructive pulmonary disease (HCC)   . DDD (degenerative disc disease), lumbar   . Diastolic CHF (HCC)   . Essential hypertension   . Hematuria   . History of CVA (cerebrovascular accident)    2013 - lacunar infarct right thalamus - residual left side of  lip numb  and left eye vision worse (which corrented lens implant from cataract extraction)  . History  of MI (myocardial infarction)    July 2001  . Hyperlipidemia   . Hypothyroidism   . Insulin dependent type 2 diabetes mellitus (HCC)   . Lesion of bladder   . Lower urinary tract symptoms (LUTS)   . OSA (obstructive sleep apnea)   . Peripheral neuropathy   . Psoriasis   . SUI (stress urinary incontinence, female)   . Varicose veins     PSH:  Past Surgical History:  Procedure Laterality Date  . APPENDECTOMY  age 89  . CARDIAC CATHETERIZATION  1991   No sig. cad  . CARDIAC CATHETERIZATION  07-01-2000   high grade in-stent restenosis  . CARDIAC CATHETERIZATION  04-30-2001   single native vessel cad/ graft patent  . CARDIAC CATHETERIZATION  02-13-2006  dr Jenness Cornerbrodie   2V cad with total occluded LAD  and  70% ostial of small ramus/  grafts patent/  normal ef  . CARDIAC CATHETERIZATION  01-20-2000  dr Clifton Jamesmcalhany   essentially unchanged;  severe single vessel cad with diffuse disease throughout CFX and RCA/ ef 66%;  chf with preserved LVSF  . CARDIOVASCULAR STRESS TEST  08-28-2011   DR ROTHBART   NEGATIVE NUCLEAR STUDY/  GLOBAL LVSF/  EF 65%  . CARPAL TUNNEL RELEASE Bilateral yrs ago  . CATARACT EXTRACTION W/ INTRAOCULAR LENS  IMPLANT, BILATERAL  2013  . CHOLECYSTECTOMY  1990  . COLONOSCOPY N/A  07/26/2015   Procedure: COLONOSCOPY;  Surgeon: Malissa Hippo, MD;  [facility];  Service: Endoscopy;  Laterality: N/A;  1200  . CORONARY ANGIOPLASTY  11-13-1999   balloon angioplasty to mLAD , d2 of LAD  . CORONARY ANGIOPLASTY WITH STENT PLACEMENT  01-17-2000   PCI stenting to mLAD & D2 of LAD  . CORONARY ARTERY BYPASS GRAFT  07-02-2000   DR Tressie Stalker   SVG to diagonal, LIMA to LAD  . CYSTOSCOPY W/ RETROGRADES Bilateral 08/09/2014   Procedure: CYSTOSCOPY, BILATERAL RETROGRADE, HYDRODISTENSION, BLADDER BIOPSY WITH FULGERATION, INSTILL PYRIDIUM AND MARCAINE ;  Surgeon: Anner Crete, MD;  Location: [facility];  Service: Urology;  Laterality: Bilateral;  . KNEE  ARTHROSCOPY Left 10-18-2003   SYNOVECTOMY  . LUMBAR SPINE SURGERY  x2    yrs ago  . ORIF HUMERUS FRACTURE Right 04-18-2008  . REPAIR FLEXOR TENDON HAND Right 09-20-2008   CARPI RADIALIS TENDON TRANSFER TO EXTENSOR TO INDEX, MIDDLE, RING , LITTLE FINGERS AND DIGITI MININI  . ROTATOR CUFF REPAIR Right 1995  . TOTAL ABDOMINAL HYSTERECTOMY W/ BILATERAL SALPINGOOPHORECTOMY  age 89  . TRANSTHORACIC ECHOCARDIOGRAM  12-12-2013   MILD LVH/  EF 60-65%/  GRADE I DIASTOLIC DYSFUNCTION/  MILD LAE    Family history: Family History  Problem Relation Age of Onset  . Hypertension Mother   . Stroke Mother   . Coronary artery disease Unknown        Multiple first and second-degree relatives  . Aneurysm Unknown        Cerebral circulation  . Colon cancer Sister   . Stroke Sister   . Coronary artery disease Sister   . Clotting disorder Unknown        Children diagnosed with hypercoagulable state  . Coronary artery disease Brother     Social history:  Social History   Social History  . Marital status: Married    Spouse name: N/A  . Number of children: 2  . Years of education: N/A   Occupational History  .  Retired   Social History Main Topics  . Smoking status: Never Smoker  . Smokeless tobacco: Never Used  . Alcohol use No  . Drug use: No  . Sexual activity: No   Other Topics Concern  . Not on file   Social History Narrative  . No narrative on file    Current outpatient meds: Medications reviewed and reconciled Current Meds  Medication Sig  . HYDROcodone-acetaminophen (NORCO) 10-325 MG tablet Take 1 tablet by mouth every 6 (six) hours as needed (chronic back pain).  [name] levothyroxine (SYNTHROID, LEVOTHROID) 25 MCG tablet Take 25 mcg by mouth every morning.   [name] lisinopril (PRINIVIL,ZESTRIL) 10 MG tablet Take 1 tablet by mouth every morning.   [name] LORazepam (ATIVAN) 1 MG tablet Take 1 mg by mouth at bedtime.  . metFORMIN (GLUCOPHAGE) 500 MG tablet Take 500 mg by mouth 2 (two) times  daily with a meal.    . pravastatin (PRAVACHOL) 20 MG tablet Take 20 mg by mouth at bedtime.     Current inpatient meds: Medications reviewed and reconciled Current Facility-Administered Medications  Medication Dose Route Frequency Provider Last Rate Last Dose  . 0.9 %  sodium chloride infusion  50 mL Intravenous Once Benjiman Core, MD      . ondansetron [facility]) 4 MG/2ML injection             Allergies: Allergies  Allergen Reactions  . Codeine Itching  ROS: As per HPI. A full 14-point review of systems was performed and is otherwise notable for chronic low back pain. Remainder of ROS is negative.   PE:  BP (!) 95/47   Pulse (!) 59   Temp 98.2 F (36.8 C) (Oral)   Resp (!) 21   Ht 5\' 4"  (1.626 m)   Wt 88.6 kg (195 lb 5.2 oz)   SpO2 97%   BMI 33.53 kg/m   General: WD obese caucasian woman lying on bed, frustrated by difficulty coming up with words. He is alert, oriented x4. Speech clear, no dysarthria. No aphasia but she does have occasional word-finding pauses. Follows commands briskly. Affect is bright with congruent mood. Comportment is normal.  HEENT: Normocephalic. Neck supple without LAD. MMM, OP clear. Dentition good. Sclerae anicteric. No conjunctival injection.  CV: Regular, no murmur. Carotid pulses full and symmetric, no bruits. Distal pulses 2+ and symmetric.  Lungs: CTAB.  Abdomen: Soft, obese, non-distended, non-tender. Bowel sounds present x4.  Extremities: No C/C/E. Neuro:  CN: Pupils are equal and round. They are symmetrically reactive from 3-->2 mm. Visual fields are full. EOMI without nystagmus.She has breakup of smooth pursuits in all directions. No reported diplopia. She reports decreased sensation on the left side of her face. Face is symmetric at rest with normal strength and mobility. Hearing is intact to conversational voice. Palate elevates symmetrically and uvula is midline. Voice is normal in tone, pitch and quality. Bilateral SCM and trapezii  are 5/5. Tongue is midline with normal bulk and mobility.  Motor: Normal bulk, tone, and strength. No tremor or other abnormal movements. No drift.  Sensation: Decreased on the left arm.  DTRs: 2+, symmetric. Toes downgoing bilaterally. No pathologic reflexes.  Coordination: Finger-to-nose and heel-to-shin are without dysmetria. Finger taps are normal in amplitude and speed, no decrement.    Labs:  Lab Results  Component Value Date   WBC 11.4 (H) 05/10/2017   HGB 10.5 (L) 05/10/2017   HCT 31.0 (L) 05/10/2017   PLT 371 05/10/2017   GLUCOSE 145 (H) 05/10/2017   CHOL 162 08/02/2016   TRIG 314 (H) 08/02/2016   HDL 27 (L) 08/02/2016   LDLCALC 72 08/02/2016   ALT 13 (L) 05/10/2017   AST 16 05/10/2017   NA 138 05/10/2017   K 3.8 05/10/2017   CL 103 05/10/2017   CREATININE 0.90 05/10/2017   BUN 17 05/10/2017   CO2 26 05/10/2017   TSH 1.116 08/02/2016   INR 1.09 05/10/2017   HGBA1C 8.5 (H) 08/01/2016    Imaging:  I have personally and independently reviewed the [facility] without contrast from this afternoon at 1621. This shows moderate diffuse atrophy. There is a moderate to severe burden of chronic small vessel ischemic disease in the bihemispheric white matter. No acute abnormality is seen. This is unchanged from a prior scan earlier today.   Assessment and Plan:  1. Acute Ischemic Stroke: She is s/p tPA given at 1230 today. Known risk factors for cerebrovascular disease in this patient include CAD, HTN, h/o CVA, hyperlipidemia, DM, OSA, and age.  Admit to neuro ICU for close monitoring for first 24 hours post-tPA with frequent neuro checks per protocol  Avoid antiplatelet agents and anticoagulation for the first 24 hours post-tPA   Monitor blood pressure closely, keeping SBP<180 and DBP<105 with short-acting easily titratable antihypertensives (i.e. Labetalol PRN, nicardipine or cleviprex infusion)  Avoid invasive procedures or punctures at non-compressible sites for the first 24  hours post-tPA  NPO until swallow  evaluation passed  Initiate/continue statin for secondary stroke prevention  Will start Plavix for secondary stroke prevention once 24 hours out from tPA  Avoid fever and hyperglycemia as these may result in extension of infarct and are associated with worse neurologic prognosis  MRI brain without contrast  MRA head without contrast  Carotid Dopplers  TTE  Check fasting lipids and hemoglobin a1c  PT/OT/SLP to evaluate and treat as needed  2. Left hemisensory loss: This is acute, due to stroke. PT/OT/rehab as needed.   3. Word-finding difficulty: This is acute, due to stroke. This is mild. Follow.   4. Headache: This was initially severe, starting during transport while tPA infusion was finishing. STAT CTH obtained and negative for bleeding. Symptomatic management as needed.   5. Chronic low back pain: No acute issues. Takes Norco PRN at home, will continue here.   This was discussed with the patient. She is in agreement with the plan as noted. She was given the opportunity to ask any questions and these were addressed to her satisfaction.   Stroke team will assume care of the patient starting 05/11/17.

## 2017-05-11 ENCOUNTER — Encounter (HOSPITAL_COMMUNITY): Payer: Self-pay

## 2017-05-11 ENCOUNTER — Inpatient Hospital Stay (HOSPITAL_COMMUNITY): Payer: Medicare HMO

## 2017-05-11 DIAGNOSIS — Z8673 Personal history of transient ischemic attack (TIA), and cerebral infarction without residual deficits: Secondary | ICD-10-CM

## 2017-05-11 DIAGNOSIS — E1159 Type 2 diabetes mellitus with other circulatory complications: Secondary | ICD-10-CM

## 2017-05-11 DIAGNOSIS — R2990 Unspecified symptoms and signs involving the nervous system: Secondary | ICD-10-CM

## 2017-05-11 DIAGNOSIS — E785 Hyperlipidemia, unspecified: Secondary | ICD-10-CM

## 2017-05-11 DIAGNOSIS — I340 Nonrheumatic mitral (valve) insufficiency: Secondary | ICD-10-CM

## 2017-05-11 DIAGNOSIS — Z794 Long term (current) use of insulin: Secondary | ICD-10-CM

## 2017-05-11 DIAGNOSIS — N3000 Acute cystitis without hematuria: Secondary | ICD-10-CM

## 2017-05-11 LAB — TROPONIN I: Troponin I: <0.03 ng/mL (ref <0.03)

## 2017-05-11 LAB — LIPID PANEL
Cholesterol: 158 mg/dL (ref 0–200)
HDL: 26 mg/dL — AB (ref >40)
LDL Cholesterol: 97 mg/dL (ref 0–99)
Total CHOL/HDL Ratio: 6.1 RATIO
Triglycerides: 173 mg/dL — ABNORMAL HIGH (ref <150)
VLDL: 35 mg/dL (ref 0–40)

## 2017-05-11 LAB — GLUCOSE, CAPILLARY
Glucose-Capillary: 149 mg/dL — ABNORMAL HIGH (ref 65–99)
Glucose-Capillary: 140 mg/dL — ABNORMAL HIGH (ref 65–99)
Glucose-Capillary: 218 mg/dL — ABNORMAL HIGH (ref 65–99)
Glucose-Capillary: 232 mg/dL — ABNORMAL HIGH (ref 65–99)
Glucose-Capillary: 221 mg/dL — ABNORMAL HIGH (ref 65–99)
Glucose-Capillary: 225 mg/dL — ABNORMAL HIGH (ref 65–99)

## 2017-05-11 LAB — ECHOCARDIOGRAM COMPLETE
HEIGHTINCHES: 64 in
Weight: 3125.24 oz

## 2017-05-11 MED ORDER — PRAVASTATIN SODIUM 40 MG PO TABS
40.0000 mg | ORAL_TABLET | Freq: Every day | ORAL | Status: DC
  Administered 2017-05-11 – 2017-05-12 (×2): 40 mg via ORAL
  Filled 2017-05-11 (×2): qty 1

## 2017-05-11 MED ORDER — PRAVASTATIN SODIUM 20 MG PO TABS: 20.0000 mg | ORAL_TABLET | Freq: Every day | ORAL | Status: DC

## 2017-05-11 MED ORDER — PANTOPRAZOLE SODIUM 40 MG PO TBEC
40.0000 mg | DELAYED_RELEASE_TABLET | Freq: Every day | ORAL | Status: DC
  Administered 2017-05-11 – 2017-05-13 (×3): 40 mg via ORAL
  Filled 2017-05-11 (×3): qty 1

## 2017-05-11 MED ORDER — ASPIRIN EC 325 MG PO TBEC
325.0000 mg | DELAYED_RELEASE_TABLET | Freq: Every day | ORAL | Status: DC
  Administered 2017-05-11 – 2017-05-13 (×3): 325 mg via ORAL
  Filled 2017-05-11 (×3): qty 1

## 2017-05-11 MED ORDER — INSULIN ASPART 100 UNIT/ML ~~LOC~~ SOLN
0.0000 [IU] | Freq: Three times a day (TID) | SUBCUTANEOUS | Status: DC
  Administered 2017-05-11 (×2): 5 [IU] via SUBCUTANEOUS
  Administered 2017-05-12: 3 [IU] via SUBCUTANEOUS
  Administered 2017-05-12: 5 [IU] via SUBCUTANEOUS
  Administered 2017-05-12 – 2017-05-13 (×2): 3 [IU] via SUBCUTANEOUS
  Administered 2017-05-13: 8 [IU] via SUBCUTANEOUS

## 2017-05-11 MED ORDER — LORAZEPAM 2 MG/ML IJ SOLN
1.0000 mg | Freq: Once | INTRAMUSCULAR | Status: AC
  Administered 2017-05-11: 1 mg via INTRAVENOUS
  Filled 2017-05-11: qty 1

## 2017-05-11 MED ORDER — SULFAMETHOXAZOLE-TRIMETHOPRIM 800-160 MG PO TABS
1.0000 | ORAL_TABLET | Freq: Every day | ORAL | Status: DC
  Administered 2017-05-11 – 2017-05-13 (×3): 1 via ORAL
  Filled 2017-05-11 (×3): qty 1

## 2017-05-11 MED ORDER — FENTANYL CITRATE (PF) 100 MCG/2ML IJ SOLN
25.0000 ug | Freq: Once | INTRAMUSCULAR | Status: AC
  Administered 2017-05-11: 25 ug via INTRAVENOUS

## 2017-05-11 MED ORDER — ONDANSETRON HCL 4 MG/2ML IJ SOLN
4.0000 mg | Freq: Four times a day (QID) | INTRAMUSCULAR | Status: DC | PRN
  Administered 2017-05-11 – 2017-05-13 (×4): 4 mg via INTRAVENOUS
  Filled 2017-05-11 (×4): qty 2

## 2017-05-11 MED ORDER — GUAIFENESIN 100 MG/5ML PO SOLN
200.0000 mg | Freq: Four times a day (QID) | ORAL | Status: DC | PRN
  Administered 2017-05-11 – 2017-05-13 (×2): 200 mg via ORAL
  Filled 2017-05-11 (×2): qty 10

## 2017-05-11 MED ORDER — INSULIN ASPART 100 UNIT/ML ~~LOC~~ SOLN
0.0000 [IU] | Freq: Every day | SUBCUTANEOUS | Status: DC
  Administered 2017-05-11 – 2017-05-12 (×2): 2 [IU] via SUBCUTANEOUS

## 2017-05-11 MED ORDER — ONDANSETRON HCL 4 MG/2ML IJ SOLN
4.0000 mg | Freq: Four times a day (QID) | INTRAMUSCULAR | Status: DC
  Administered 2017-05-11: 4 mg via INTRAVENOUS
  Filled 2017-05-11: qty 2

## 2017-05-11 NOTE — Evaluation (Signed)
Physical Therapy Evaluation Patient Details Name: [name] AuerRuby J Geraldo MRN: [id number] DOB: [birth year] Today's Date: 05/11/2017   History of Present Illness  Pt is a 81 y.o. female presenting with c/o numbness of the left face and arm associated with headache and dizziness and a "funny" feeling in her left eye. She was also noted to have mild confusion, some paresthesias on left side of her face and decreased sensation on the left side. CT on 5/19 showed no acute intracranial abnormalities. MRI pending.  Clinical Impression  Orders received for PT evaluation. Patient demonstrates deficits in functional mobility as indicated below. Will benefit from continued skilled PT to address deficits and maximize function. Will see as indicated and progress as tolerated.  Recommend HHPT upon acute discharge and will attempt use of RW to improve stability during ambulation.   OF NOTE: VSS throughout session on room air.    Follow Up Recommendations Home health PT;Supervision/Assistance - 24 hour    Equipment Recommendations   (Recommend use of RW)    Recommendations for Other Services       Precautions / Restrictions Precautions Precautions: Fall Restrictions Weight Bearing Restrictions: No      Mobility  Bed Mobility Overal bed mobility: Needs Assistance Bed Mobility: Supine to Sit     Supine to sit: Min guard     General bed mobility comments: Min guard with trunk elevation to sitting. Increased time. HOB slightly elevated with use of bed rail  Transfers Overall transfer level: Needs assistance Equipment used: None Transfers: Sit to/from Stand Sit to Stand: Min guard         General transfer comment: Min guard for safety due to balance deficits  Ambulation/Gait Ambulation/Gait assistance: Min guard;Min assist Ambulation Distance (Feet): 110 Feet Assistive device: 1 person hand held assist Gait Pattern/deviations: Step-through pattern;Decreased stride length;Drifts  right/left Gait velocity: decreased Gait velocity interpretation: Below normal speed for age/gender General Gait Details: patient with noted instability and occassional balance checks requiring min assist to steady.   Stairs            Wheelchair Mobility    Modified Rankin (Stroke Patients Only) Modified Rankin (Stroke Patients Only) Pre-Morbid Rankin Score: No symptoms Modified Rankin: Moderate disability     Balance Overall balance assessment: Needs assistance Sitting-balance support: Feet supported;No upper extremity supported Sitting balance-Leahy Scale: Good     Standing balance support: No upper extremity supported;During functional activity Standing balance-Leahy Scale: Poor Standing balance comment: multiple small LOB with mobility; requiring min assist to correct                             Pertinent Vitals/Pain Pain Assessment: 0-10 Pain Score: 5  Pain Location: back, ribs Pain Descriptors / Indicators: Sore Pain Intervention(s): Monitored during session;Repositioned;Premedicated before session    Home Living Family/patient expects to be discharged to:: Private residence Living Arrangements: Spouse/significant other;Children Available Help at Discharge: Family;Available 24 hours/day Type of Home: House Home Access: Stairs to enter   [employer] Corporation[employer]trance Stairs-Number of Steps: 2 Home Layout: One level Home Equipment: Cane - single point;Walker - 4 wheels;Grab bars - tub/shower;Shower seat      Prior Function Level of Independence: Needs assistance   Gait / Transfers Assistance Needed: Pt uses cane occasionally for functional mobility  ADL's / Homemaking Assistance Needed: Pt reports daughter assists with dressing and bathing when needed.         Hand Dominance   Dominant Hand: Right  Extremity/Trunk Assessment   Upper Extremity Assessment Upper Extremity Assessment: LUE deficits/detail LUE Deficits / Details: Strength grossly 4/5. Pt  reports sensation changes throughout. Full AROM. LUE Sensation: decreased light touch    Lower Extremity Assessment Lower Extremity Assessment: Defer to PT evaluation       Communication   Communication: No difficulties  Cognition Arousal/Alertness: Awake/alert Behavior During Therapy: WFL for tasks assessed/performed Overall Cognitive Status: Within Functional Limits for tasks assessed                                        General Comments      Exercises     Assessment/Plan    PT Assessment Patient needs continued PT services  PT Problem List Decreased strength;Decreased activity tolerance;Decreased balance;Decreased mobility;Decreased coordination       PT Treatment Interventions DME instruction;Gait training;Stair training;Functional mobility training;Therapeutic activities;Therapeutic exercise;Balance training;Neuromuscular re-education;Patient/family education    PT Goals (Current goals can be found in the Care Plan section)  Acute Rehab PT Goals Patient Stated Goal: get better and return home PT Goal Formulation: With patient Time For Goal Achievement: 05/25/17 Potential to Achieve Goals: Good    Frequency Min 4X/week   Barriers to discharge        Co-evaluation   Reason for Co-Treatment: To address functional/ADL transfers           AM-PAC PT "6 Clicks" Daily Activity  Outcome Measure Difficulty turning over in bed (including adjusting bedclothes, sheets and blankets)?: A Lot Difficulty moving from lying on back to sitting on the side of the bed? : A Lot Difficulty sitting down on and standing up from a chair with arms (e.g., wheelchair, bedside commode, etc,.)?: A Little Help needed moving to and from a bed to chair (including a wheelchair)?: A Little Help needed walking in hospital room?: A Little Help needed climbing 3-5 steps with a railing? : A Lot 6 Click Score: 15    End of Session Equipment Utilized During Treatment: Gait  belt Activity Tolerance: Patient tolerated treatment well Patient left: in chair;with call bell/phone within reach Nurse Communication: Mobility status PT Visit Diagnosis: Unsteadiness on feet (R26.81)    Time: 1210-1227 PT Time Calculation (min) (ACUTE ONLY): 17 min   Charges:   PT Evaluation $PT Eval Moderate Complexity: 1 Procedure     PT G Codes:        Charlotte Crumb, PT DPT  [phone]   Fabio Asa 05/11/2017, 2:00 PM

## 2017-05-11 NOTE — Progress Notes (Signed)
STROKE TEAM PROGRESS NOTE   HISTORY OF PRESENT ILLNESS (per record) This is a 81-yo RH woman who presented to [facility] ED at 1330 today. She complained of numbness of the left face and arm associated with headache and dizziness and a "funny" feeling in her left eye. On ED MD exam, she was noted to have mild confusion with some paresthesias on the left side of her face and decreased sensation on the left side. She had good grip strength bilaterally. Teleneurology consultation was obtained and advised CODE STROKE activation. She was sent for emergent [facility] which reportedly showed no acute abnormality. She had a reported NIHSS score of 7. Blood pressure was never elevated with SBP 95-116 at [facility]. The decision was made to treat with tPA and she was given a total dose of 78 mg with 7.8 mg bolus at 1402 followed by infusion of 70.2 mg over the next hour. She was transferred to [facility] for admission and further management.   En route to [facility], transport RN called the receiving RN and reported that the patient complained of a worsening headache. This has since improved. She still has some moderate headache and says that it is hard for her to think. She continues to have numbness in her left face and hand and feels like she is having trouble getting her words out.   Last known well: about 1230 today NHISS score: 4 tPA given?: Yes, given at [facility] Saturday, 05/10/2017 at 1400 hrs.   SUBJECTIVE (INTERVAL HISTORY) Her RN is at the bedside.  No family is at bedside. She still feels some left face and arm numbness but better than yesterday worsen than her baseline. She had several similar episode like this in the past due to recrudescence of old stroke in the setting of UTI. This time UA also showed UTI. She also has symptoms of urinary urgency. She denies prophylaxis Abx for UTI but bactrim DS for UTI prevention was on her medication list.    OBJECTIVE Temp:  [97.7 F (36.5 C)-98.2  F (36.8 C)] 97.8 F (36.6 C) (05/20 0736) Pulse Rate:  [48-108] 48 (05/20 0700) Cardiac Rhythm: Sinus bradycardia (05/20 0400) Resp:  [11-23] 13 (05/20 0700) BP: (95-141)/(40-127) 124/50 (05/20 0700) SpO2:  [83 %-100 %] 100 % (05/20 0700) Weight:  [87.1 kg (192 lb)-88.6 kg (195 lb 5.2 oz)] 88.6 kg (195 lb 5.2 oz) (05/19 1520)  CBC:   Recent Labs Lab 05/10/17 1333 05/10/17 1343  WBC 11.4*  --   NEUTROABS 5.8  --   HGB 9.3* 10.5*  HCT 32.2* 31.0*  MCV 69.2*  --   PLT 371  --     Basic Metabolic Panel:   Recent Labs Lab 05/10/17 1333 05/10/17 1343  NA 139 138  K 3.9 3.8  CL 104 103  CO2 26  --   GLUCOSE 149* 145*  BUN 17 17  CREATININE 0.86 0.90  CALCIUM 9.4  --     Lipid Panel:     Component Value Date/Time   CHOL 158 05/11/2017 0234   TRIG 173 (H) 05/11/2017 0234   HDL 26 (L) 05/11/2017 0234   CHOLHDL 6.1 05/11/2017 0234   VLDL 35 05/11/2017 0234   LDLCALC 97 05/11/2017 0234   HgbA1c:  Lab Results  Component Value Date   HGBA1C 8.5 (H) 08/01/2016   Urine Drug Screen:     Component Value Date/Time   LABOPIA NONE DETECTED 09/19/2016 1640   COCAINSCRNUR NONE DETECTED 09/19/2016  1640   LABBENZ NONE DETECTED 09/19/2016 1640   AMPHETMU NONE DETECTED 09/19/2016 1640   THCU NONE DETECTED 09/19/2016 1640   LABBARB NONE DETECTED 09/19/2016 1640    Alcohol Level     Component Value Date/Time   ETH <5 05/10/2017 1333    IMAGING I have personally reviewed the radiological images below and agree with the radiology interpretations.  Ct Head Wo Contrast 05/10/2017 No acute intracranial abnormalities. Stable appearance from the study obtained earlier today.   Ct Head Wo Contrast 05/10/2017 No acute intracranial findings. Moderate chronic ischemic microvascular disease.   MRI and MRA pending  CUS pending  TTE pending   PHYSICAL EXAM  Temp:  [97.7 F (36.5 C)-98.2 F (36.8 C)] 97.8 F (36.6 C) (05/20 0736) Pulse Rate:  [48-108] 55 (05/20  0800) Resp:  [11-23] 19 (05/20 0800) BP: (95-141)/(40-127) 128/49 (05/20 0800) SpO2:  [83 %-100 %] 99 % (05/20 0800) Weight:  [192 lb (87.1 kg)-195 lb 5.2 oz (88.6 kg)] 195 lb 5.2 oz (88.6 kg) (05/19 1520)  General - Well nourished, well developed, in no apparent distress.  Ophthalmologic - Sharp disc margins OU.  Cardiovascular - Regular rate and rhythm.  Mental Status -  Level of arousal and orientation to time, place, and person were intact. Language including expression, naming, repetition, comprehension was assessed and found intact. Fund of Knowledge was assessed and was intact.  Cranial Nerves II - XII - II - Visual field intact OU. III, IV, VI - Extraocular movements intact. V - Facial sensation intact bilaterally, subjective left facial numbness feeling. VII - right nasolabial fold flattening at rest, but symmetric on facial movement. VIII - Hearing & vestibular intact bilaterally. X - Palate elevates symmetrically. XI - Chin turning & shoulder shrug intact bilaterally. XII - Tongue protrusion intact.  Motor Strength - The patient's strength was normal in all extremities and pronator drift was absent.  Bulk was normal and fasciculations were absent.   Motor Tone - Muscle tone was assessed at the neck and appendages and was normal.  Reflexes - The patient's reflexes were 1+ in all extremities and she had no pathological reflexes.  Sensory - Light touch, temperature/pinprick were assessed and were symmetrical, but subjective left arm numbness feeling.    Coordination - The patient had normal movements in the hands with no ataxia or dysmetria.  Tremor was absent.  Gait and Station - deferred.   ASSESSMENT/PLAN Ms. LINN CLAVIN is a 81 y.o. female with history of peripheral neuropathy, obstructive sleep apnea, diabetes mellitus, hypothyroidism, hyperlipidemia, coronary artery disease with MI, previous stroke, hypertension, congestive heart failure, and COPD,  presenting with left sided numbness, headache, dizziness, and mild confusion.  She received TPA - Saturday, 05/10/2017 at 1400 hrs at [facility]  Likely recrudescence from old right thalamic infarct   Resultant  Subjective left sided paresthesia  CT head - No acute intracranial findings.  MRI head - pending  MRA head - pending  Carotid Doppler - pending  2D Echo - pending  LDL - 97  HgbA1c - pending  VTE prophylaxis - SCDs Diet heart healthy/carb modified Room service appropriate? Yes; Fluid consistency: Thin  aspirin 325 mg daily prior to admission, now on No antithrombotic at this time secondary to TPA therapy.  Patient counseled to be compliant with her antithrombotic medications  Ongoing aggressive stroke risk factor management  Therapy recommendations: pending  Disposition: Pending  Following with neurology Dr. Gerilyn Pilgrim as outpt   Hx of stroke / recrudescence of  stroke  Right thalamic infarct 2013  Frequent episode of recrudescence of stroke symptoms - seen [place]  Suspicious left pontine infarct in 07/2016  Has been following with Dr. [place]ilyn [place]  On ASA and pravastatin at home  Recurrent UTI  UA WBCTMTC   Pt denies prophylactic Abx at home  However, bactrium DS daily is on her meds list  Continue bactrium DS daily this time  Recommend outpt urology referral  Hypertension  Blood pressure tends to run mildly low.  Long-term BP goal normotensive  Hyperlipidemia  Home meds:  Pravachol 20 mg daily resumed in hospital  LDL 97, goal < 70  Increase Pravachol to 40 mg daily  Continue statin at discharge  Diabetes  Home meds - insulin NPH bid  HgbA1c pending, goal < 7.0  CBG stable  SSI  CBG monitoring  Other Stroke Risk Factors  Advanced age  Obesity, Body mass index is 33.53 kg/m., recommend weight loss, diet and exercise as appropriate   Family hx stroke (sister and mother)  Coronary artery disease  Obstructive sleep  apnea  Other Active Problems  Anemia - 10.5 / 31  Mild leukocytosis - 11.4 (afebrile)  Hospital day # 1  This patient is critically ill due to stroke like symptoms s/p tPA, and UTI and at significant risk of neurological worsening, death form hemorrhage, shock, sepsis. This patient's care requires constant monitoring of vital signs, hemodynamics, respiratory and cardiac monitoring, review of multiple databases, neurological assessment, discussion with family, other specialists and medical decision making of high complexity. I spent 35 minutes of neurocritical care time in the care of this patient.  Marvel PlanJindong Maddyx Vallie, MD PhD Stroke Neurology 05/11/2017 10:37 AM   To contact Stroke Continuity provider, please refer to [email or web address]. After hours, contact General Neurology

## 2017-05-11 NOTE — Progress Notes (Signed)
PT Cancellation Note  Patient Details Name: [name] [name] MRN: [id number] DOB: 1935-12-26   Cancelled Treatment:    Reason Eval/Treat Not Completed: Patient not medically ready, patient on bedrest orders at this time.   [name] [name] 05/11/2017, 8:06 AM [name] [name], PT DPT  [phone]

## 2017-05-11 NOTE — Progress Notes (Signed)
Pt sent down for MRI scan at 1345. Pt wedding ring (simple band) given to husband, Burl, who will take it home.

## 2017-05-11 NOTE — Evaluation (Signed)
Occupational Therapy Evaluation Patient Details Name: [name] [name] MRN: [id number] DOB: 1936/06/05 Today's Date: 05/11/2017    History of Present Illness Pt is a 81 y.o. female presenting with c/o numbness of the left face and arm associated with headache and dizziness and a "funny" feeling in her left eye. She was also noted to have mild confusion, some paresthesias on left side of her face and decreased sensation on the left side. CT on 5/19 showed no acute intracranial abnormalities. MRI pending.   Clinical Impression   Pt reports her family assisted minimally with BADL PTA. Currently pt overall min guard assist for ADL and min assist for functional mobility to correct multiple LOB. Pt presenting with LUE weakness, L UE impaired sensation, and poor standing balance impacting her independence and safety with ADL and functional mobility. Pt planning to d/c home with 24/7 supervision from family. Recommending HHOT for follow up to maximize independence and safety with ADL and functional mobility upon return home. Pt would benefit from continued skilled OT to address established goals.    Follow Up Recommendations  Home health OT;Supervision/Assistance - 24 hour    Equipment Recommendations  None recommended by OT    Recommendations for Other Services       Precautions / Restrictions Precautions Precautions: Fall Restrictions Weight Bearing Restrictions: No      Mobility Bed Mobility Overal bed mobility: Needs Assistance Bed Mobility: Supine to Sit     Supine to sit: Min guard     General bed mobility comments: Min guard with trunk elevation to sitting. Increased time. HOB slightly elevated with use of bed rail  Transfers Overall transfer level: Needs assistance Equipment used: None Transfers: Sit to/from Stand Sit to Stand: Min guard         General transfer comment: Min guard for safety due to balance deficits    Balance Overall balance assessment: Needs  assistance Sitting-balance support: Feet supported;No upper extremity supported Sitting balance-Leahy Scale: Good     Standing balance support: No upper extremity supported;During functional activity Standing balance-Leahy Scale: Poor Standing balance comment: multiple small LOB with mobility; requiring min assist to correct                           ADL either performed or assessed with clinical judgement   ADL Overall ADL's : Needs assistance/impaired Eating/Feeding: Set up;Sitting   Grooming: Min guard;Standing;Wash/dry hands   Upper Body Bathing: Set up;Supervision/ safety;Sitting   Lower Body Bathing: Min guard;Sit to/from stand   Upper Body Dressing : Set up;Supervision/safety;Sitting   Lower Body Dressing: Min guard;Sit to/from stand   Toilet Transfer: Minimal assistance;Ambulation;Comfort height toilet;Grab bars Toilet Transfer Details (indicate cue type and reason): Min assist for balance due to multiple LOB with mobility Toileting- Clothing Manipulation and Hygiene: Min guard;Sit to/from stand       Functional mobility during ADLs: Minimal assistance General ADL Comments: VSS throughout on RA     Vision Baseline Vision/History: No visual deficits Patient Visual Report: No change from baseline       Perception     Praxis      Pertinent Vitals/Pain Pain Assessment: 0-10 Pain Score: 5  Pain Location: back, ribs Pain Descriptors / Indicators: Sore Pain Intervention(s): Monitored during session;Repositioned;Premedicated before session     Hand Dominance Right   Extremity/Trunk Assessment Upper Extremity Assessment Upper Extremity Assessment: LUE deficits/detail LUE Deficits / Details: Strength grossly 4/5. Pt reports sensation changes throughout. Full AROM.  LUE Sensation: decreased light touch   Lower Extremity Assessment Lower Extremity Assessment: Defer to PT evaluation       Communication Communication Communication: No  difficulties   Cognition Arousal/Alertness: Awake/alert Behavior During Therapy: WFL for tasks assessed/performed Overall Cognitive Status: Within Functional Limits for tasks assessed                                     General Comments       Exercises     Shoulder Instructions      Home Living Family/patient expects to be discharged to:: Private residence Living Arrangements: Spouse/significant other;Children Available Help at Discharge: Family;Available 24 hours/day Type of Home: House Home Access: Stairs to enter Entergy CorporationEntrance Stairs-Number of Steps: 2   Home Layout: One level     Bathroom Shower/Tub: Tub/shower unit;Curtain   FirefighterBathroom Toilet: Standard     Home Equipment: Cane - single point;Walker - 4 wheels;Grab bars - tub/shower;Shower seat          Prior Functioning/Environment Level of Independence: Needs assistance  Gait / Transfers Assistance Needed: Pt uses cane occasionally for functional mobility ADL's / Homemaking Assistance Needed: Pt reports daughter assists with dressing and bathing when needed.             OT Problem List: Decreased strength;Decreased activity tolerance;Impaired balance (sitting and/or standing);Decreased knowledge of use of DME or AE;Impaired sensation;Obesity      OT Treatment/Interventions: Self-care/ADL training;Therapeutic exercise;Neuromuscular education;Energy conservation;DME and/or AE instruction;Therapeutic activities;Patient/family education;Balance training    OT Goals(Current goals can be found in the care plan section) Acute Rehab OT Goals Patient Stated Goal: get better and return home OT Goal Formulation: With patient Time For Goal Achievement: 05/25/17 Potential to Achieve Goals: Good ADL Goals Pt Will Perform Upper Body Bathing: sitting;with set-up Pt Will Perform Lower Body Bathing: with supervision;sit to/from stand Pt Will Transfer to Toilet: with supervision;ambulating;regular height  toilet Pt Will Perform Toileting - Clothing Manipulation and hygiene: with supervision;sit to/from stand Pt Will Perform Tub/Shower Transfer: Tub transfer;with supervision;ambulating;shower seat Pt/caregiver will Perform Home Exercise Program: Left upper extremity;Increased strength;Independently;With written HEP provided  OT Frequency: Min 3X/week   Barriers to D/C:            Co-evaluation PT/OT/SLP Co-Evaluation/Treatment: Yes Reason for Co-Treatment: To address functional/ADL transfers          AM-PAC PT "6 Clicks" Daily Activity     Outcome Measure Help from another person eating meals?: None Help from another person taking care of personal grooming?: A Little Help from another person toileting, which includes using toliet, bedpan, or urinal?: A Little Help from another person bathing (including washing, rinsing, drying)?: A Little Help from another person to put on and taking off regular upper body clothing?: A Little Help from another person to put on and taking off regular lower body clothing?: A Little 6 Click Score: 19   End of Session Equipment Utilized During Treatment: Gait belt Nurse Communication: Mobility status  Activity Tolerance: Patient tolerated treatment well Patient left: in chair;with call bell/phone within reach  OT Visit Diagnosis: Unsteadiness on feet (R26.81);Muscle weakness (generalized) (M62.81)                Time: 8657-84691209-1227 OT Time Calculation (min): 18 min Charges:  OT General Charges $OT Visit: 1 Procedure OT Evaluation $OT Eval Moderate Complexity: 1 Procedure G-Codes:     Delanda Bulluck A. Brett Albinooffey, M.S., OTR/L Pager:  [phone]  Gaye Alken 05/11/2017, 1:11 PM

## 2017-05-11 NOTE — Progress Notes (Signed)
OT Cancellation Note  Patient Details Name: Lise AuerRuby J Kihn MRN: [id number] DOB: [birth year]   Cancelled Treatment:    Reason Eval/Treat Not Completed: Patient not medically ready (active bedrest orders). Will follow up for OT eval as time allows.  Gaye AlkenBailey A Nyashia Raney M.S., OTR/L Pager: [phone]  05/11/2017, 8:07 AM

## 2017-05-11 NOTE — Progress Notes (Signed)
  Echocardiogram 2D Echocardiogram has been performed.  [name] [name] T [name] [name] 05/11/2017, 8:43 AM

## 2017-05-12 ENCOUNTER — Inpatient Hospital Stay (HOSPITAL_COMMUNITY): Payer: Medicare HMO

## 2017-05-12 DIAGNOSIS — L409 Psoriasis, unspecified: Secondary | ICD-10-CM

## 2017-05-12 DIAGNOSIS — I639 Cerebral infarction, unspecified: Secondary | ICD-10-CM

## 2017-05-12 LAB — CBC
HEMATOCRIT: 30.7 % — AB (ref 36.0–46.0)
HEMOGLOBIN: 8.3 g/dL — AB (ref 12.0–15.0)
MCH: 19 pg — ABNORMAL LOW (ref 26.0–34.0)
MCHC: 27 g/dL — ABNORMAL LOW (ref 30.0–36.0)
MCV: 70.4 fL — ABNORMAL LOW (ref 78.0–100.0)
Platelets: 298 10*3/uL (ref 150–400)
RBC: 4.36 MIL/uL (ref 3.87–5.11)
RDW: 18.3 % — ABNORMAL HIGH (ref 11.5–15.5)
WBC: 8.7 10*3/uL (ref 4.0–10.5)

## 2017-05-12 LAB — GLUCOSE, CAPILLARY
GLUCOSE-CAPILLARY: 191 mg/dL — AB (ref 65–99)
GLUCOSE-CAPILLARY: 225 mg/dL — AB (ref 65–99)
Glucose-Capillary: 186 mg/dL — ABNORMAL HIGH (ref 65–99)
Glucose-Capillary: 204 mg/dL — ABNORMAL HIGH (ref 65–99)

## 2017-05-12 LAB — HEMOGLOBIN A1C
Hgb A1c MFr Bld: 7.8 % — ABNORMAL HIGH (ref 4.8–5.6)
MEAN PLASMA GLUCOSE: 177 mg/dL

## 2017-05-12 LAB — BASIC METABOLIC PANEL
ANION GAP: 5 (ref 5–15)
BUN: 8 mg/dL (ref 6–20)
CO2: 27 mmol/L (ref 22–32)
Calcium: 8.7 mg/dL — ABNORMAL LOW (ref 8.9–10.3)
Chloride: 104 mmol/L (ref 101–111)
Creatinine, Ser: 0.83 mg/dL (ref 0.44–1.00)
Glucose, Bld: 253 mg/dL — ABNORMAL HIGH (ref 65–99)
POTASSIUM: 5.1 mmol/L (ref 3.5–5.1)
SODIUM: 136 mmol/L (ref 135–145)

## 2017-05-12 LAB — VAS US CAROTID
LCCAPDIAS: 23 cm/s
LCCAPSYS: 112 cm/s
LEFT ECA DIAS: 0 cm/s
LEFT VERTEBRAL DIAS: -17 cm/s
LICAPSYS: -110 cm/s
Left CCA dist dias: -21 cm/s
Left CCA dist sys: -107 cm/s
Left ICA dist dias: -25 cm/s
Left ICA dist sys: -81 cm/s
Left ICA prox dias: -35 cm/s
RCCADSYS: -107 cm/s
RCCAPDIAS: 22 cm/s
RIGHT ECA DIAS: 0 cm/s
RIGHT VERTEBRAL DIAS: -10 cm/s
Right CCA prox sys: 179 cm/s

## 2017-05-12 IMAGING — US US CAROTID DUPLEX BILAT
1 series · 13 of 24 positions shown · non-contrast
Comparison: 06/27/2015

CLINICAL DATA: Left facial numbness

EXAM:
BILATERAL CAROTID DUPLEX ULTRASOUND
TECHNIQUE: Gray scale imaging, color Doppler and duplex ultrasound were
performed of bilateral carotid and vertebral arteries in the neck.

[Series 1: us carotid duplex bilat · 0.07mm/px · 13 of 68 slices shown]
[im 1/68]
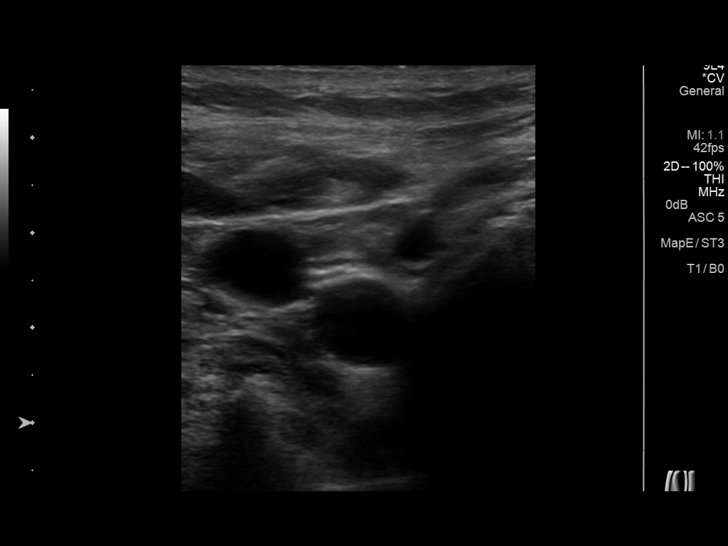
[im 6/68]
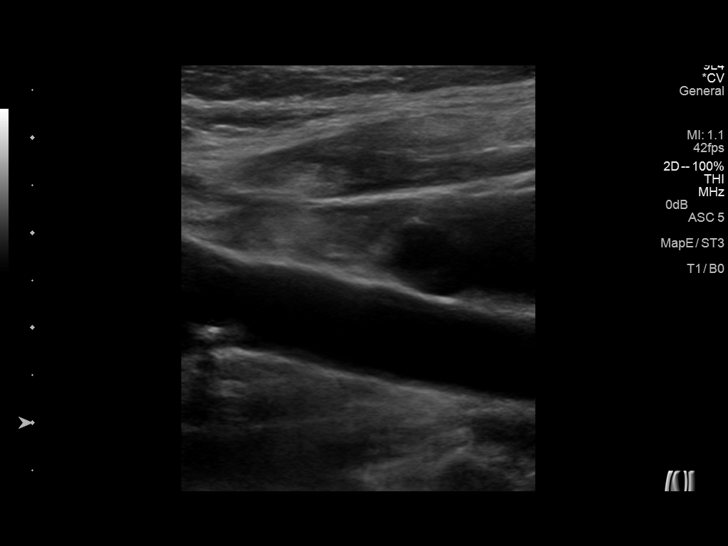
[im 12/68]
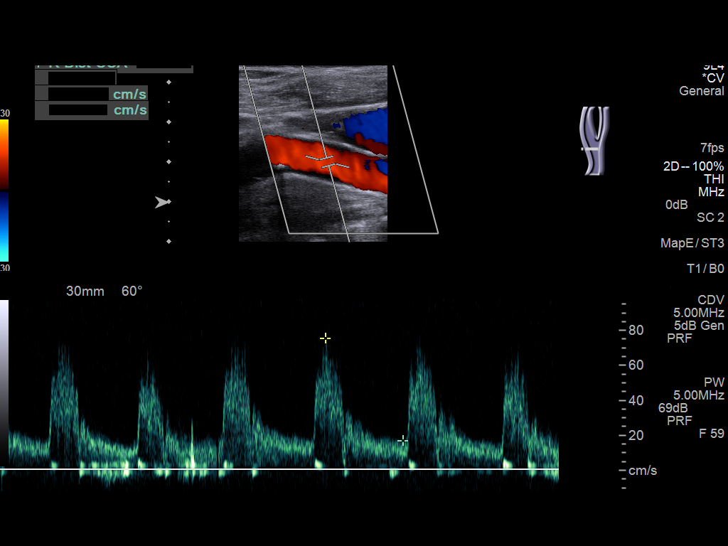
[im 18/68]
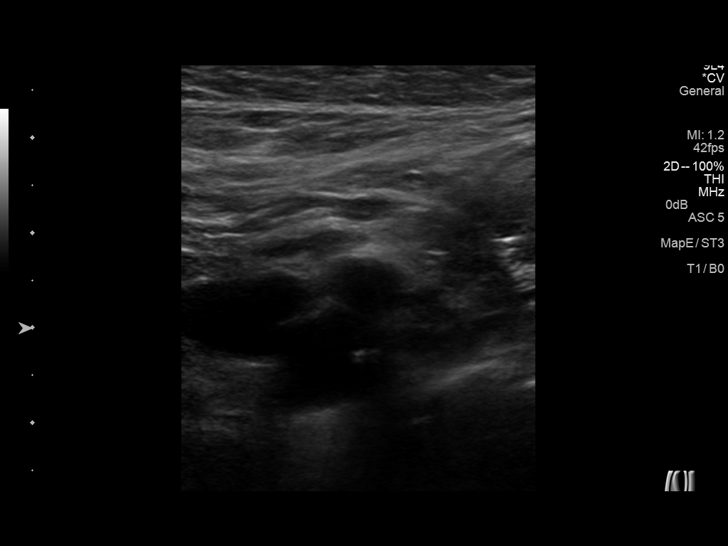
[im 24/68]
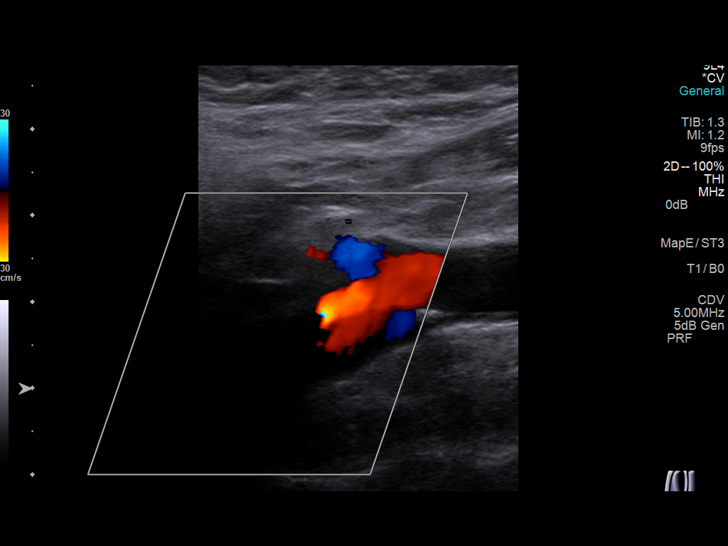
[im 30/68]
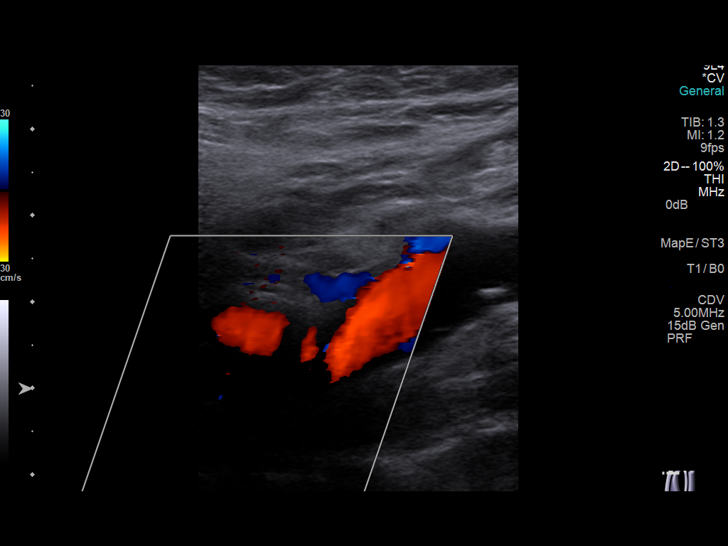
[im 35/68]
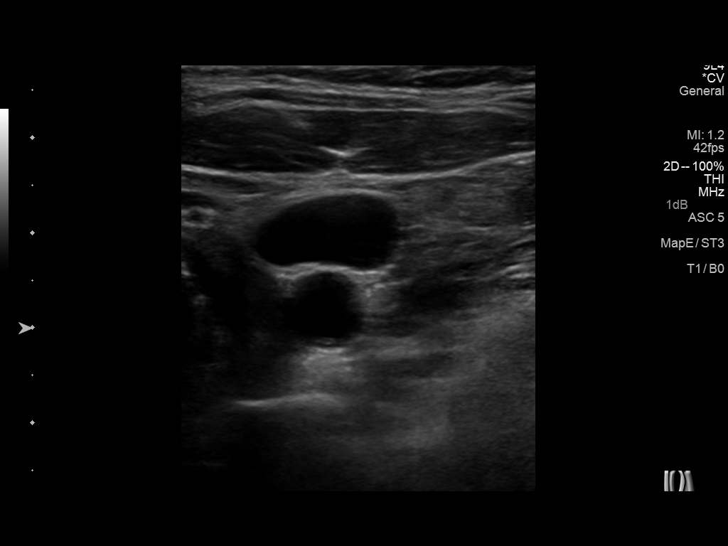
[im 38/68]
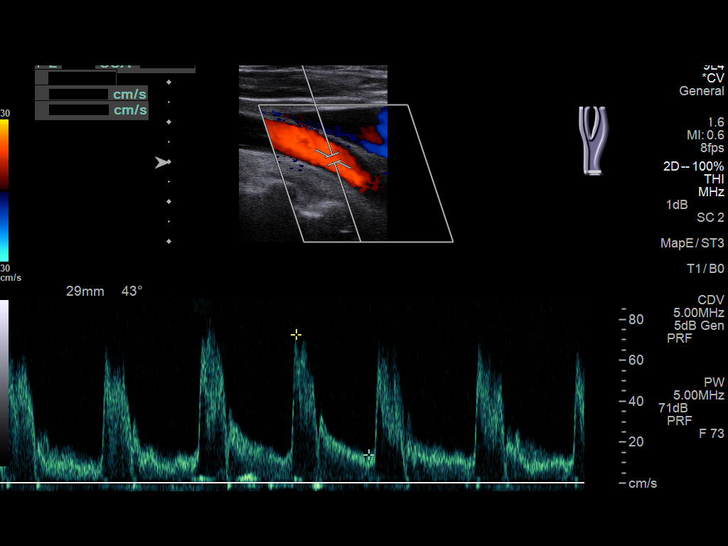
[im 44/68]
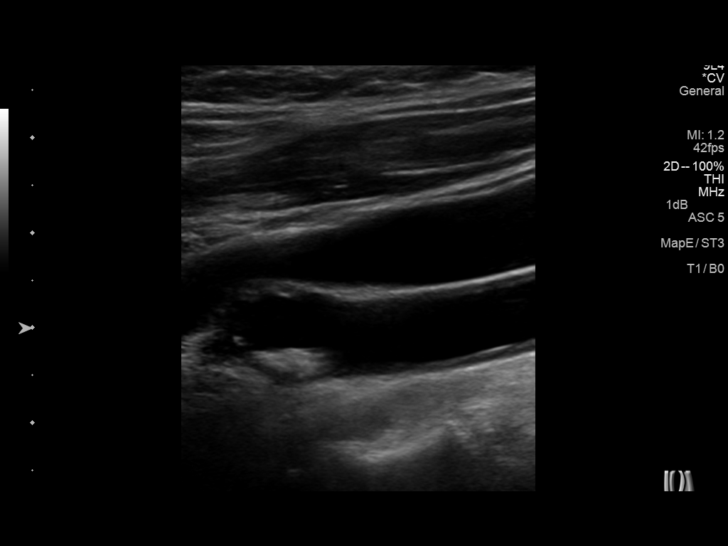
[im 50/68]
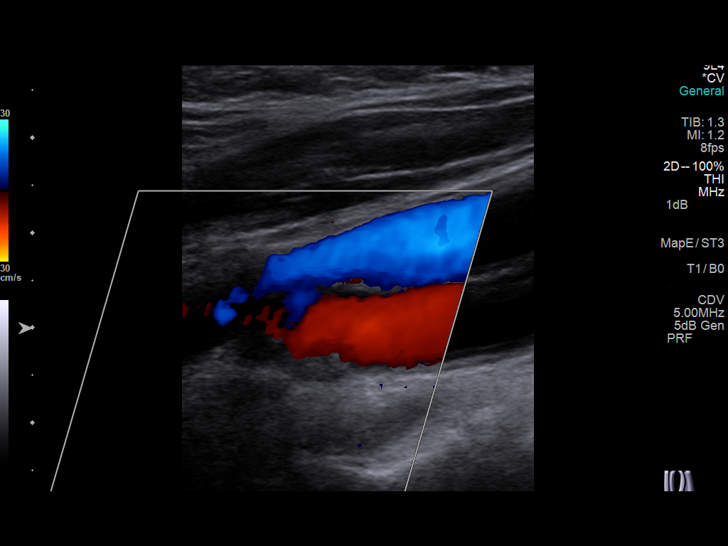
[im 56/68]
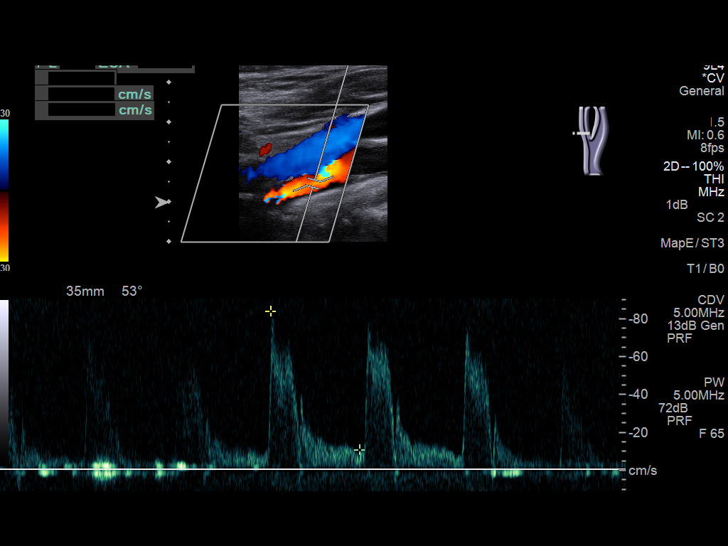
[im 62/68]
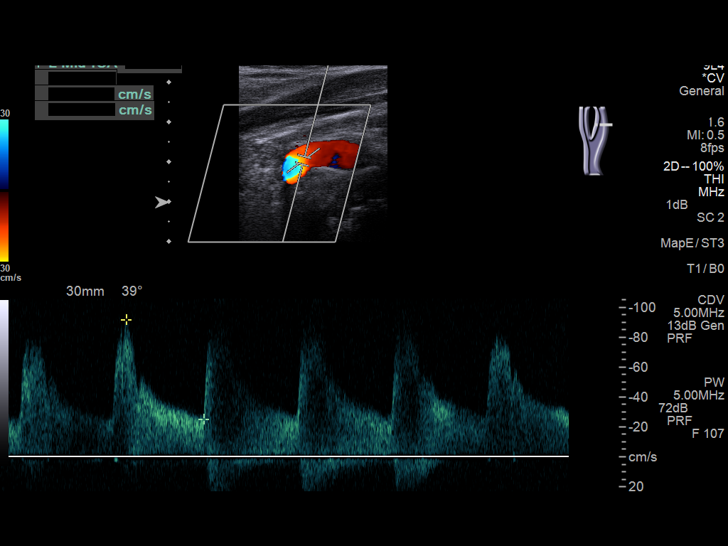
[im 68/68]
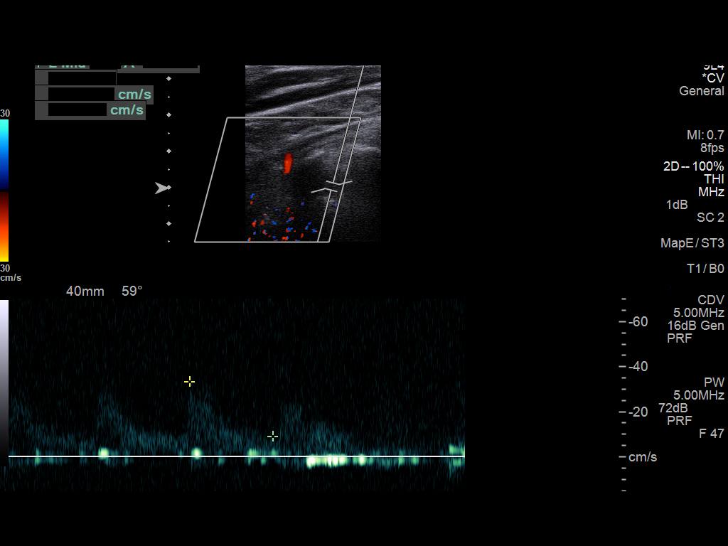

[13 of 24 positions shown; findings below may reference images not displayed]

FINDINGS: Criteria: Quantification of carotid stenosis is based on velocity
parameters that correlate the residual internal carotid diameter
with NASCET-based stenosis levels, using the diameter of the distal
internal carotid lumen as the denominator for stenosis measurement.

The following velocity measurements were obtained:

RIGHT

ICA:  73 cm/sec

CCA:  87 cm/sec

SYSTOLIC ICA/CCA RATIO:

DIASTOLIC ICA/CCA RATIO:

ECA:  83 cm/sec

LEFT

ICA:  92 cm/sec

CCA:  67 cm/sec

SYSTOLIC ICA/CCA RATIO:

DIASTOLIC ICA/CCA RATIO:

ECA:  84 cm/sec

RIGHT CAROTID ARTERY: Mild mixed plaque in the bulb. Low resistance
internal carotid Doppler pattern.

RIGHT VERTEBRAL ARTERY:  Antegrade with a normal Doppler pattern.

LEFT CAROTID ARTERY: Mild mixed plaque in the bulb. Low resistance
internal carotid Doppler pattern.

LEFT VERTEBRAL ARTERY:  Antegrade with a normal Doppler pattern.
IMPRESSION: Less than 50% stenosis in the right and left internal carotid
arteries.

## 2017-05-12 MED ORDER — TRIAMCINOLONE 0.1 % CREAM:EUCERIN CREAM 1:1
TOPICAL_CREAM | Freq: Three times a day (TID) | CUTANEOUS | Status: DC | PRN
  Filled 2017-05-12: qty 1

## 2017-05-12 MED ORDER — BISACODYL 10 MG RE SUPP
10.0000 mg | Freq: Once | RECTAL | Status: AC
  Administered 2017-05-12: 10 mg via RECTAL
  Filled 2017-05-12: qty 1

## 2017-05-12 MED ORDER — IPRATROPIUM-ALBUTEROL 0.5-2.5 (3) MG/3ML IN SOLN
3.0000 mL | RESPIRATORY_TRACT | Status: DC | PRN
  Administered 2017-05-12: 3 mL via RESPIRATORY_TRACT
  Filled 2017-05-12: qty 3

## 2017-05-12 MED ORDER — FUROSEMIDE 10 MG/ML IJ SOLN
40.0000 mg | Freq: Once | INTRAMUSCULAR | Status: AC
  Administered 2017-05-12: 40 mg via INTRAVENOUS
  Filled 2017-05-12: qty 4

## 2017-05-12 MED ORDER — PREMIER PROTEIN SHAKE
11.0000 [oz_av] | Freq: Two times a day (BID) | ORAL | Status: DC
  Administered 2017-05-12 – 2017-05-13 (×3): 11 [oz_av] via ORAL
  Filled 2017-05-12 (×2): qty 325.31

## 2017-05-12 MED ORDER — DOCUSATE SODIUM 100 MG PO CAPS
100.0000 mg | ORAL_CAPSULE | Freq: Two times a day (BID) | ORAL | Status: DC
  Administered 2017-05-12 – 2017-05-13 (×3): 100 mg via ORAL
  Filled 2017-05-12 (×3): qty 1

## 2017-05-12 NOTE — Progress Notes (Signed)
Initial Nutrition Assessment  DOCUMENTATION CODES:   Obesity unspecified  INTERVENTION:   Premier Protein BID, each supplement provides 160kcal and 30g protein.   NUTRITION DIAGNOSIS:   Inadequate oral intake related to acute illness as evidenced by per patient/family report.  GOAL:   Patient will meet greater than or equal to 90% of their needs  MONITOR:   PO intake, Supplement acceptance, Labs, Weight trends  REASON FOR ASSESSMENT:   Malnutrition Screening Tool    ASSESSMENT:   81 y.o. female with history of peripheral neuropathy, obstructive sleep apnea, diabetes mellitus, hypothyroidism, hyperlipidemia, coronary artery disease with MI, previous stroke, hypertension, congestive heart failure, and COPD, presenting with left sided numbness, headache, dizziness, and mild confusion.   Met with pt in room today. Pt reports poor appetite with decreased oral intake for 3 months pta. Pt reports good appetite today but reports that she does not like the hospital food. Pt reports that she is currently eating 50% meals. Per chart, pt has lost 6lbs(3%) in 2 months which is not significant. RD discussed the importance of adequate protein intake with pt; pt willing to try Premier Protein.   Medications reviewed and include: aspirin, colace, lasix, insulin, synthroid, protonix, hydrocodone  Labs reviewed: Ca 8.7(L) Hgb 8.3(L), Hct 30.7(L) cbgs- 149, 145, 253 x 48hrs AIC 7.8(H)- 5/20  Nutrition-Focused physical exam completed. Findings are no fat depletion, no muscle depletion, and no edema.   Diet Order:  Diet heart healthy/carb modified Room service appropriate? Yes; Fluid consistency: Thin  Skin:  Reviewed, no issues  Last BM:  none since admit  Height:   Ht Readings from Last 1 Encounters:  05/10/17 '5\' 4"'$  (1.626 m)    Weight:   Wt Readings from Last 1 Encounters:  05/10/17 195 lb 5.2 oz (88.6 kg)    Ideal Body Weight:  54.5 kg  BMI:  Body mass index is 33.53  kg/m.  Estimated Nutritional Needs:   Kcal:  1700-1900kcal/day   Protein:  89-106g/day   Fluid:  >1.7L/day   EDUCATION NEEDS:   No education needs identified at this time  Koleen Distance MS, RD, LDN Pager #- [phone]

## 2017-05-12 NOTE — Progress Notes (Signed)
Patient transferred from unit 4N to room 5C22 at this time. Alert and in stable condition.

## 2017-05-12 NOTE — Progress Notes (Signed)
Occupational Therapy Treatment Patient Details Name: [name] [name] MRN: [id number] DOB: [birth year] Today's Date: 05/12/2017    History of present illness Pt is a 81 y.o. female presenting with c/o numbness of the left face and arm associated with headache and dizziness and a "funny" feeling in her left eye. She was also noted to have mild confusion, some paresthesias on left side of her face and decreased sensation on the left side. CT on 5/19 showed no acute intracranial abnormalities. MRI unremarkable.    OT comments  Pt requires min A for functional transfers due to imbalance.  She has impaired memory and does not recall that PT had instructed her to use RW.  She is at risk for falls.  Recommend HHOT.   Follow Up Recommendations  Home health OT;Supervision/Assistance - 24 hour    Equipment Recommendations  None recommended by OT    Recommendations for Other Services      Precautions / Restrictions Precautions Precautions: Fall       Mobility Bed Mobility                  Transfers Overall transfer level: Needs assistance Equipment used: None Transfers: Sit to/from Stand;Stand Pivot Transfers Sit to Stand: Min guard Stand pivot transfers: Min guard            Balance Overall balance assessment: Needs assistance Sitting-balance support: Feet supported;No upper extremity supported Sitting balance-Leahy Scale: Good     Standing balance support: During functional activity;No upper extremity supported Standing balance-Leahy Scale: Fair                             ADL either performed or assessed with clinical judgement   ADL Overall ADL's : Needs assistance/impaired                         Toilet Transfer: Minimal assistance;Ambulation;Regular Social worker and Hygiene: Min guard;Sit to/from stand   Tub/ Shower Transfer: Tub transfer;Minimal assistance;Ambulation;Shower seat;Grab Forensic psychologist Details (indicate cue type and reason): Pt reports that her daughter assists her with shower transfers and will continue to do so  Functional mobility during ADLs: Minimal assistance       Vision       Perception     Praxis      Cognition Arousal/Alertness: Awake/alert Behavior During Therapy: WFL for tasks assessed/performed Overall Cognitive Status: Impaired/Different from baseline Area of Impairment: Memory;Safety/judgement                     Memory: Decreased short-term memory   Safety/Judgement: Decreased awareness of deficits;Decreased awareness of safety     General Comments: Pt with no recollection that PT had instructed her to use RW         Exercises     Shoulder Instructions       General Comments Pt with complaint of nausea for which she received pain meds, but was very fixated on feeling nausea.     Pertinent Vitals/ Pain       Pain Assessment: No/denies pain  Home Living                                          Prior Functioning/Environment  Frequency  Min 3X/week        Progress Toward Goals  OT Goals(current goals can now be found in the care plan section)  Progress towards OT goals: Progressing toward goals  ADL Goals Pt Will Perform Upper Body Bathing: sitting;with set-up Pt Will Perform Lower Body Bathing: with supervision;sit to/from stand Pt Will Transfer to Toilet: with supervision;ambulating;regular height toilet Pt Will Perform Toileting - Clothing Manipulation and hygiene: with supervision;sit to/from stand Pt Will Perform Tub/Shower Transfer: Tub transfer;with supervision;ambulating;shower seat Pt/caregiver will Perform Home Exercise Program: Left upper extremity;Increased strength;Independently;With written HEP provided  Plan Discharge plan remains appropriate    Co-evaluation                 AM-PAC PT "6 Clicks" Daily Activity     Outcome Measure   Help from  another person eating meals?: None Help from another person taking care of personal grooming?: A Little Help from another person toileting, which includes using toliet, bedpan, or urinal?: A Little Help from another person bathing (including washing, rinsing, drying)?: A Little Help from another person to put on and taking off regular upper body clothing?: A Little Help from another person to put on and taking off regular lower body clothing?: A Little 6 Click Score: 19    End of Session    OT Visit Diagnosis: Unsteadiness on feet (R26.81);Muscle weakness (generalized) (M62.81)   Activity Tolerance Patient tolerated treatment well   Patient Left in chair;with call bell/phone within reach   Nurse Communication Mobility status        Time: [id number] OT Time Calculation (min): 36 min  Charges: OT General Charges $OT Visit: 1 Procedure OT Treatments $Self Care/Home Management : 23-37 mins  Reynolds AmericanWendi Rajeev Escue, OTR/L [phone]    Jeani HawkingConarpe, Remmy Riffe M 05/12/2017, 6:27 PM

## 2017-05-12 NOTE — Progress Notes (Signed)
Physical Therapy Treatment Patient Details Name: [name] [name] MRN: [id number] DOB: 07-09-1936 Today's Date: 05/12/2017    History of Present Illness Pt is a 81 y.o. female presenting with c/o numbness of the left face and arm associated with headache and dizziness and a "funny" feeling in her left eye. She was also noted to have mild confusion, some paresthesias on left side of her face and decreased sensation on the left side. CT on 5/19 showed no acute intracranial abnormalities. MRI unremarkable.     PT Comments    Patient progressing well towards PT goals. Reports increased coughing spells today and mild SOB. Tolerated gait training with Min A for balance/safety due to multiple LOB. Recommend use of RW at home to decrease fall risk. Encouraged more higher level balance activities next session. Will follow and plan for stair training to prepare for d/c home. Will follow.     Follow Up Recommendations  Home health PT;Supervision/Assistance - 24 hour     Equipment Recommendations  None recommended by PT    Recommendations for Other Services       Precautions / Restrictions Precautions Precautions: Fall Restrictions Weight Bearing Restrictions: No    Mobility  Bed Mobility Overal bed mobility: Needs Assistance Bed Mobility: Supine to Sit     Supine to sit: Supervision     General bed mobility comments: Supervision for safety. No assist needed. no dizziness.  Transfers Overall transfer level: Needs assistance Equipment used: None Transfers: Sit to/from Stand Sit to Stand: Min guard         General transfer comment: Min guard for safety due to balance deficits. Stood from [employer], from toilet x1, transferred to chair post ambulation.  Ambulation/Gait Ambulation/Gait assistance: Min assist Ambulation Distance (Feet): 150 Feet Assistive device: None Gait Pattern/deviations: Step-through pattern;Decreased stride length;Drifts right/left;Staggering left;Staggering  right Gait velocity: decreased Gait velocity interpretation: Below normal speed for age/gender General Gait Details: Slow, unsteady gait with staggering noted esp with head turns. LOB x3 requiring Min A to steady. Would benefit from RW.    Stairs            Wheelchair Mobility    Modified Rankin (Stroke Patients Only) Modified Rankin (Stroke Patients Only) Pre-Morbid Rankin Score: No symptoms Modified Rankin: Moderate disability     Balance Overall balance assessment: Needs assistance Sitting-balance support: Feet supported;No upper extremity supported Sitting balance-Leahy Scale: Good Sitting balance - Comments: Able to reach for toilet paper and perform pericare without LOB or assist.    Standing balance support: During functional activity;No upper extremity supported Standing balance-Leahy Scale: Poor Standing balance comment: Able to wash hands at sink with 2 LOB posteriorly when trying to brush hair requiring assist to prevent fall.                            Cognition Arousal/Alertness: Awake/alert Behavior During Therapy: WFL for tasks assessed/performed Overall Cognitive Status: Within Functional Limits for tasks assessed                                        Exercises      General Comments General comments (skin integrity, edema, etc.): VSS.      Pertinent Vitals/Pain Pain Assessment: No/denies pain    Home Living  Prior Function            PT Goals (current goals can now be found in the care plan section) Progress towards PT goals: Progressing toward goals    Frequency    Min 3X/week      PT Plan Frequency needs to be updated    Co-evaluation              AM-PAC PT "6 Clicks" Daily Activity  Outcome Measure  Difficulty turning over in bed (including adjusting bedclothes, sheets and blankets)?: None Difficulty moving from lying on back to sitting on the side of the bed? :  None Difficulty sitting down on and standing up from a chair with arms (e.g., wheelchair, bedside commode, etc,.)?: None Help needed moving to and from a bed to chair (including a wheelchair)?: A Little Help needed walking in hospital room?: A Little Help needed climbing 3-5 steps with a railing? : A Lot 6 Click Score: 20    End of Session Equipment Utilized During Treatment: Gait belt Activity Tolerance: Patient tolerated treatment well Patient left: in chair;with call bell/phone within reach (xray tech in room.) Nurse Communication: Mobility status PT Visit Diagnosis: Unsteadiness on feet (R26.81)     Time: [id number] PT Time Calculation (min) (ACUTE ONLY): 20 min  Charges:  $Gait Training: 8-22 mins                    G Codes:       Mylo RedShauna Ellarae Nevitt, PT, DPT [phone]     Blake DivineShauna A Nareg Breighner 05/12/2017, 9:46 AM

## 2017-05-12 NOTE — Progress Notes (Addendum)
STROKE TEAM PROGRESS NOTE   SUBJECTIVE (INTERVAL HISTORY) Her RN is at the bedside. Pt complains of constipation and itchy from psoriasis. Also some wheezing, will d/c IVF and put on lasix. Will give topical cream and stool softener. MRI showed no stroke.    OBJECTIVE Temp:  [97.5 F (36.4 C)-98.3 F (36.8 C)] 97.6 F (36.4 C) (05/21 0800) Pulse Rate:  [54-90] 55 (05/21 0700) Cardiac Rhythm: Normal sinus rhythm (05/21 0800) Resp:  [0-21] 12 (05/21 0700) BP: (98-145)/(37-102) 127/52 (05/21 0700) SpO2:  [89 %-100 %] 93 % (05/21 0700)  CBC:   Recent Labs Lab 05/10/17 1333 05/10/17 1343 05/12/17 0254  WBC 11.4*  --  8.7  NEUTROABS 5.8  --   --   HGB 9.3* 10.5* 8.3*  HCT 32.2* 31.0* 30.7*  MCV 69.2*  --  70.4*  PLT 371  --  298    Basic Metabolic Panel:   Recent Labs Lab 05/10/17 1333 05/10/17 1343 05/12/17 0254  NA 139 138 136  K 3.9 3.8 5.1  CL 104 103 104  CO2 26  --  27  GLUCOSE 149* 145* 253*  BUN 17 17 8   CREATININE 0.86 0.90 0.83  CALCIUM 9.4  --  8.7*    Lipid Panel:     Component Value Date/Time   CHOL 158 05/11/2017 0234   TRIG 173 (H) 05/11/2017 0234   HDL 26 (L) 05/11/2017 0234   CHOLHDL 6.1 05/11/2017 0234   VLDL 35 05/11/2017 0234   LDLCALC 97 05/11/2017 0234   HgbA1c:  Lab Results  Component Value Date   HGBA1C 7.8 (H) 05/11/2017   Urine Drug Screen:     Component Value Date/Time   LABOPIA NONE DETECTED 09/19/2016 1640   COCAINSCRNUR NONE DETECTED 09/19/2016 1640   LABBENZ NONE DETECTED 09/19/2016 1640   AMPHETMU NONE DETECTED 09/19/2016 1640   THCU NONE DETECTED 09/19/2016 1640   LABBARB NONE DETECTED 09/19/2016 1640    Alcohol Level     Component Value Date/Time   ETH <5 05/10/2017 1333    IMAGING I have personally reviewed the radiological images below and agree with the radiology interpretations.  Ct Head Wo Contrast 05/10/2017 No acute intracranial abnormalities. Stable appearance from the study obtained earlier today.    Ct Head Wo Contrast 05/10/2017 No acute intracranial findings. Moderate chronic ischemic microvascular disease.   MRI and MRA 05/12/2017 1. No acute intracranial abnormality. 2. The study is severely degraded by patient motion. 3. Extensive white matter disease likely reflects the sequela of chronic microvascular ischemia. 4. Atherosclerotic changes within the cavernous internal carotid arteries bilaterally. 5. The vertebrobasilar junction is normal. 6. [name] Willis is obscured by severe patient motion. The time-of-flight images are nondiagnostic above the level of the cavernous sinus.  CUS pending  TTE  05/11/2017 Study Conclusions - Left ventricle: The cavity size was normal. Wall thickness was   increased in a pattern of mild LVH. Systolic function was normal.   The estimated ejection fraction was in the range of 55% to 60%.   Wall motion was normal; there were no regional wall motion   abnormalities. Doppler parameters are consistent with abnormal   left ventricular relaxation (grade 1 diastolic dysfunction).   Doppler parameters are consistent with high ventricular filling   pressure. - Mitral valve: Calcified annulus. There was mild regurgitation. - Left atrium: The atrium was mildly dilated. Impressions: - Normal LV systolic function; mild diastolic dysfunction; mild   LVH; sclerotic aortic valve; mild MR; mild LAE; mild  TR.   PHYSICAL EXAM  Temp:  [97.5 F (36.4 C)-98.3 F (36.8 C)] 97.6 F (36.4 C) (05/21 0800) Pulse Rate:  [54-90] 55 (05/21 0700) Resp:  [0-21] 12 (05/21 0700) BP: (98-145)/(37-102) 127/52 (05/21 0700) SpO2:  [89 %-100 %] 93 % (05/21 0700)  General - Well nourished, well developed, in no apparent distress.  Ophthalmologic - Sharp disc margins OU.  Cardiovascular - Regular rate and rhythm.  Skin - multiple psoriasic rashes at b/l LEs and LUE.   Mental Status -  Level of arousal and orientation to time, place, and person were  intact. Language including expression, naming, repetition, comprehension was assessed and found intact. Fund of Knowledge was assessed and was intact.  Cranial Nerves II - XII - II - Visual field intact OU. III, IV, VI - Extraocular movements intact. V - Facial sensation intact bilaterally, subjective left facial numbness feeling. VII - right nasolabial fold flattening at rest, but symmetric on facial movement. VIII - Hearing & vestibular intact bilaterally. X - Palate elevates symmetrically. XI - Chin turning & shoulder shrug intact bilaterally. XII - Tongue protrusion intact.  Motor Strength - The patient's strength was normal in all extremities and pronator drift was absent.  Bulk was normal and fasciculations were absent.   Motor Tone - Muscle tone was assessed at the neck and appendages and was normal.  Reflexes - The patient's reflexes were 1+ in all extremities and she had no pathological reflexes.  Sensory - Light touch, temperature/pinprick were assessed and were symmetrical, but subjective left arm numbness feeling.    Coordination - The patient had normal movements in the hands with no ataxia or dysmetria.  Tremor was absent.  Gait and Station - deferred.   ASSESSMENT/PLAN [name] [name] is a 81 y.o. female with history of peripheral neuropathy, obstructive sleep apnea, diabetes mellitus, hypothyroidism, hyperlipidemia, coronary artery disease with MI, previous stroke, hypertension, congestive heart failure, and COPD, presenting with left sided numbness, headache, dizziness, and mild confusion.  She received TPA - Saturday, 05/10/2017 at 1400 hrs at [facility].  Recrudescence from old right thalamic infarct   Resultant  Subjective left sided paresthesia  CT head - No acute intracranial findings.  MRI head - No acute intracranial abnormality.  MRA head - [name] [name] is obscured by severe patient motion. No high-grade stenosis identified.  Carotid Doppler -  pending  2D Echo - EF 55 - 60 %. No cardiac source of emboli identified.  LDL - 97  HgbA1c - 7.8  VTE prophylaxis - SCDs Diet heart healthy/carb modified Room service appropriate? Yes; Fluid consistency: Thin  aspirin 325 mg daily prior to admission, now on No antithrombotic at this time secondary to TPA therapy.  Patient counseled to be compliant with her antithrombotic medications  Ongoing aggressive stroke risk factor management  Therapy recommendations: Home health PT and OT.  Disposition: Pending  Following with neurology Dr. Gerilyn Pilgrim as outpt   Hx of stroke / recrudescence of stroke  Right thalamic infarct 2013  Frequent episode of recrudescence of stroke symptoms - seen by Dr. Gerilyn Pilgrim  Suspicious left pontine infarct in 07/2016  Has been following with Dr. Gerilyn Pilgrim  On ASA and pravastatin at home  Recurrent UTI  UA WBCTMTC   Pt denies prophylactic Abx at home  However, bactrium DS daily is on her meds list  Continue bactrium DS daily this time  Recommend outpt urology referral  Psoriasis  Itchy rashes in extremities  Resume topical Eucerin.   Hypertension  Blood pressure tends to run mildly low.  Long-term BP goal normotensive  Hyperlipidemia  Home meds:  Pravachol 20 mg daily resumed in hospital  LDL 97, goal < 70  Increase Pravachol to 40 mg daily  Continue statin at discharge  Diabetes  Home meds - insulin NPH bid  HgbA1c 7.8 goal < 7.0  uncontrolled  CBG stable  SSI  CBG monitoring  Resume insulin on discharge  Other Stroke Risk Factors  Advanced age  Obesity, Body mass index is 33.53 kg/m., recommend weight loss, diet and exercise as appropriate   Family hx stroke (sister and mother)  Coronary artery disease  Obstructive sleep apnea  Other Active Problems  Anemia - Hb 9.3-> 8.3  Mild leukocytosis - 11.4 (afebrile)  Hospital day # 2  Marvel PlanJindong Justus Duerr, MD PhD Stroke Neurology 05/12/2017 11:25 AM   To  contact Stroke Continuity provider, please refer to [email or web address]. After hours, contact General Neurology

## 2017-05-12 NOTE — Evaluation (Signed)
Speech Language Pathology Evaluation Patient Details Name: [name] AuerRuby J [name] MRN: [id number] DOB: 09-20-1936 Today's Date: 05/12/2017 Time: [id number] SLP Time Calculation (min) (ACUTE ONLY): 23 min  Problem List:  Patient Active Problem List   Diagnosis Date Noted  . Ischemic stroke (HCC) 05/10/2017  . Acute ischemic stroke (HCC) 05/10/2017  . Stroke (HCC) 08/01/2016  . Dysarthria 03/18/2016  . Influenza-like illness 11/19/2015  . Anemia of chronic disease 11/19/2015  . OSA (obstructive sleep apnea) 11/19/2015  . Left facial numbness 08/29/2015  . Gait instability 06/26/2015  . Bronchitis 11/13/2014  . COPD exacerbation (HCC) 11/13/2014  . UTI (urinary tract infection) 08/26/2014  . Near syncope 08/24/2014  . UTI (lower urinary tract infection) 12/10/2013  . Hypoxia 12/10/2013  . Weakness generalized 12/10/2013  . TIA (transient ischemic attack) 06/22/2012  . Left-sided headache 06/22/2012  . Noncompliance 06/22/2012  . Noncompliance with CPAP treatment 06/22/2012  . Psoriasis 01/07/2012  . Cerebrovascular disease   . Chronic back pain   . Chronic obstructive pulmonary disease (HCC)   . Chest pain 08/28/2011  . DM type 2 causing complication (HCC) 02/14/2010  . Obesity 02/14/2010  . Arteriosclerotic cardiovascular disease (ASCVD) 02/14/2010  . Hypothyroidism 02/06/2010  . Hyperlipidemia 02/06/2010  . Essential hypertension 02/06/2010  . Sleep apnea 02/06/2010   Past Medical History:  Past Medical History:  Diagnosis Date  . Arthritis   . CAD (coronary artery disease)    Multivessel status post CABG 2001  . Chronic obstructive pulmonary disease (HCC)   . DDD (degenerative disc disease), lumbar   . Diastolic CHF (HCC)   . Essential hypertension   . Hematuria   . History of CVA (cerebrovascular accident)    2013 - lacunar infarct right thalamus - residual left side of  lip numb  and left eye vision worse (which corrented lens implant from cataract extraction)  .  History of MI (myocardial infarction)    July 2001  . Hyperlipidemia   . Hypothyroidism   . Insulin dependent type 2 diabetes mellitus (HCC)   . Lesion of bladder   . Lower urinary tract symptoms (LUTS)   . OSA (obstructive sleep apnea)   . Peripheral neuropathy   . Psoriasis   . SUI (stress urinary incontinence, female)   . Varicose veins    Past Surgical History:  Past Surgical History:  Procedure Laterality Date  . APPENDECTOMY  age 89  . CARDIAC CATHETERIZATION  1991   No sig. cad  . CARDIAC CATHETERIZATION  07-01-2000   high grade in-stent restenosis  . CARDIAC CATHETERIZATION  04-30-2001   single native vessel cad/ graft patent  . CARDIAC CATHETERIZATION  02-13-2006  dr Jenness Cornerbrodie   2V cad with total occluded LAD  and  70% ostial of small ramus/  grafts patent/  normal ef  . CARDIAC CATHETERIZATION  01-20-2000  dr Clifton Jamesmcalhany   essentially unchanged;  severe single vessel cad with diffuse disease throughout CFX and RCA/ ef 66%;  chf with preserved LVSF  . CARDIOVASCULAR STRESS TEST  08-28-2011   DR ROTHBART   NEGATIVE NUCLEAR STUDY/  GLOBAL LVSF/  EF 65%  . CARPAL TUNNEL RELEASE Bilateral yrs ago  . CATARACT EXTRACTION W/ INTRAOCULAR LENS  IMPLANT, BILATERAL  2013  . CHOLECYSTECTOMY  1990  . COLONOSCOPY N/A 07/26/2015   Procedure: COLONOSCOPY;  Surgeon: Malissa HippoNajeeb U Rehman, MD;  Location: [facility];  Service: Endoscopy;  Laterality: N/A;  1200  . CORONARY ANGIOPLASTY  11-13-1999   balloon angioplasty to mLAD , d2  of LAD  . CORONARY ANGIOPLASTY WITH STENT PLACEMENT  01-17-2000   PCI stenting to mLAD & D2 of LAD  . CORONARY ARTERY BYPASS GRAFT  07-02-2000   DR Tressie Stalker   SVG to diagonal, LIMA to LAD  . CYSTOSCOPY W/ RETROGRADES Bilateral 08/09/2014   Procedure: CYSTOSCOPY, BILATERAL RETROGRADE, HYDRODISTENSION, BLADDER BIOPSY WITH FULGERATION, INSTILL PYRIDIUM AND MARCAINE ;  Surgeon: Anner Crete, MD;  Location: [facility];  Service: Urology;  Laterality:  Bilateral;  . KNEE ARTHROSCOPY Left 10-18-2003   SYNOVECTOMY  . LUMBAR SPINE SURGERY  x2    yrs ago  . ORIF HUMERUS FRACTURE Right 04-18-2008  . REPAIR FLEXOR TENDON HAND Right 09-20-2008   CARPI RADIALIS TENDON TRANSFER TO EXTENSOR TO INDEX, MIDDLE, RING , LITTLE FINGERS AND DIGITI MININI  . ROTATOR CUFF REPAIR Right 1995  . TOTAL ABDOMINAL HYSTERECTOMY W/ BILATERAL SALPINGOOPHORECTOMY  age 61  . TRANSTHORACIC ECHOCARDIOGRAM  12-12-2013   MILD LVH/  EF 60-65%/  GRADE I DIASTOLIC DYSFUNCTION/  MILD LAE   HPI:  Pt is a 81 y.o. female with PMH of stroke in 2013, admitted on 5/19 c/o L facial weakness, headache, dizziness, and L facial numbness. MD reported mild confusion. No acute findings on Head CT; did show moderate chronic ischemic microvascular disease.    Assessment / Plan / Recommendation Clinical Impression  Pt scored a 15/30 on the MoCA (26 or above = WFL) with score likely impacted by pt frustration level. Throughout testing pt made several comments about not "thinking like she normally does" and would stop completing the subtest if she felt it was challenging. She had particular difficulty in the areas of recall, selective attention, complex problem solving, and reasoning. She would benefit from [facility] SLP f/u and 24/7 supervision to maximize safety and functional independence.    SLP Assessment  SLP Recommendation/Assessment: Patient needs continued Speech Lanaguage Pathology Services SLP Visit Diagnosis: Cognitive communication deficit (R41.841)    Follow Up Recommendations  Home health SLP;24 hour supervision/assistance    Frequency and Duration min 2x/week  2 weeks      SLP Evaluation Cognition  Overall Cognitive Status: Impaired/Different from baseline Arousal/Alertness: Awake/alert Orientation Level: Oriented X4 Attention: Selective Selective Attention: Impaired Selective Attention Impairment: Verbal basic Memory: Impaired Memory Impairment: Decreased recall of new  information Awareness: Impaired Awareness Impairment: Anticipatory impairment Problem Solving: Impaired Problem Solving Impairment: Verbal complex Safety/Judgment: Appears intact       Comprehension  Auditory Comprehension Overall Auditory Comprehension: Appears within functional limits for tasks assessed    Expression Expression Primary Mode of Expression: Verbal Verbal Expression Overall Verbal Expression: Appears within functional limits for tasks assessed   Oral / Motor  Motor Speech Overall Motor Speech: Appears within functional limits for tasks assessed   GO                    Maxcine Ham 05/12/2017, 10:40 AM  Maxcine Ham, M.A. CCC-SLP [phone]

## 2017-05-12 NOTE — Progress Notes (Signed)
Pt earlier transfered from 4N at shift change, alert and oriented, denies any pain, safety concern addressed accordingly but pt refused the bed alarm to be activated despite education, was however reassured and will continue to monitor. [name] [name], [name] [name]

## 2017-05-12 NOTE — Progress Notes (Signed)
**  Preliminary report by tech**  Carotid artery duplex complete. Findings are consistent with a 1-39 percent stenosis involving the right internal carotid artery and the left internal carotid artery. The vertebral arteries demonstrate antegrade flow.  05/12/17 3:41 PM Olen CordialGreg Destini Cambre RVT

## 2017-05-12 NOTE — Progress Notes (Signed)
Inpatient Diabetes Program Recommendations  AACE/ADA: New Consensus Statement on Inpatient Glycemic Control (2015)  Target Ranges:  Prepandial:   less than 140 mg/dL      Peak postprandial:   less than 180 mg/dL (1-2 hours)      Critically ill patients:  140 - 180 mg/dL   Lab Results  Component Value Date   GLUCAP 191 (H) 05/12/2017   HGBA1C 7.8 (H) 05/11/2017    Review of Glycemic Control Results for [name] [name], [name] [name] (MRN [id number]) as of 05/12/2017 12:18  Ref. Range 05/11/2017 11:45 05/11/2017 16:03 05/11/2017 20:31 05/11/2017 21:49 05/12/2017 08:26  Glucose-Capillary Latest Ref Range: 65 - 99 mg/dL 409218 (H) 811232 (H) 914221 (H) 225 (H) 191 (H)   Diabetes history: DM2 Outpatient Diabetes medications: 70/30 insulin 40 units bid Current orders for Inpatient glycemic control: Novolog correction 0-15 units tid + 0-5 units hs  Inpatient Diabetes Program Recommendations:    Please consider while in the hospital: -Lantus 18 units daily -Novolog 4 units tid meal coverage if eats 50%  Thank you, Darel HongJudy E. Bettie Capistran, RN, MSN, CDE  Diabetes Coordinator Inpatient Glycemic Control Team Team Pager [phone] (8am-5pm) 05/12/2017 12:25 PM

## 2017-05-13 DIAGNOSIS — I10 Essential (primary) hypertension: Secondary | ICD-10-CM

## 2017-05-13 LAB — URINE CULTURE: Culture: >=100000 — AB

## 2017-05-13 LAB — CBC
HCT: 31.6 % — ABNORMAL LOW (ref 36.0–46.0)
HEMOGLOBIN: 9 g/dL — AB (ref 12.0–15.0)
MCH: 19.9 pg — AB (ref 26.0–34.0)
MCHC: 28.5 g/dL — ABNORMAL LOW (ref 30.0–36.0)
MCV: 69.8 fL — ABNORMAL LOW (ref 78.0–100.0)
PLATELETS: 347 10*3/uL (ref 150–400)
RBC: 4.53 MIL/uL (ref 3.87–5.11)
RDW: 18.4 % — ABNORMAL HIGH (ref 11.5–15.5)
WBC: 10.6 10*3/uL — AB (ref 4.0–10.5)

## 2017-05-13 LAB — BASIC METABOLIC PANEL
Anion gap: 7 (ref 5–15)
BUN: 9 mg/dL (ref 6–20)
CO2: 30 mmol/L (ref 22–32)
Calcium: 9.2 mg/dL (ref 8.9–10.3)
Chloride: 99 mmol/L — ABNORMAL LOW (ref 101–111)
Creatinine, Ser: 0.83 mg/dL (ref 0.44–1.00)
Glucose, Bld: 189 mg/dL — ABNORMAL HIGH (ref 65–99)
POTASSIUM: 4.4 mmol/L (ref 3.5–5.1)
SODIUM: 136 mmol/L (ref 135–145)

## 2017-05-13 LAB — GLUCOSE, CAPILLARY
GLUCOSE-CAPILLARY: 252 mg/dL — AB (ref 65–99)
Glucose-Capillary: 170 mg/dL — ABNORMAL HIGH (ref 65–99)

## 2017-05-13 MED ORDER — GUAIFENESIN 100 MG/5ML PO SOLN: 200.0000 mg | Freq: Four times a day (QID) | ORAL | 0 refills | Status: DC | PRN

## 2017-05-13 MED ORDER — FLEET ENEMA 7-19 GM/118ML RE ENEM
1.0000 | ENEMA | Freq: Once | RECTAL | Status: AC
  Administered 2017-05-13: 1 via RECTAL
  Filled 2017-05-13: qty 1

## 2017-05-13 MED ORDER — PRAVASTATIN SODIUM 40 MG PO TABS: 40.0000 mg | ORAL_TABLET | Freq: Every day | ORAL | 6 refills | Status: DC

## 2017-05-13 MED ORDER — CIPROFLOXACIN HCL 500 MG PO TABS: 500.0000 mg | ORAL_TABLET | Freq: Two times a day (BID) | ORAL | 0 refills | Status: DC

## 2017-05-13 MED ORDER — CIPROFLOXACIN HCL 500 MG PO TABS
500.0000 mg | ORAL_TABLET | Freq: Two times a day (BID) | ORAL | Status: DC
  Administered 2017-05-13: 500 mg via ORAL
  Filled 2017-05-13: qty 1

## 2017-05-13 MED ORDER — FLEET ENEMA 7-19 GM/118ML RE ENEM: 1.0000 | ENEMA | Freq: Once | RECTAL | Status: AC

## 2017-05-13 NOTE — Progress Notes (Signed)
Patient is discharged from room 5M22 at this time. Alert and in stable condition. IV site d/c'd as well as tele. Fleet enema given per order with small soft stool result. Instructions read to patient with understanding verbalized. Left unit via wheelchair with all belongings at side.

## 2017-05-13 NOTE — Care Management Note (Signed)
Case Management Note  Patient Details  Name: STAPHANY DITTON MRN: [id number] Date of Birth: August 31, 1936  Subjective/Objective:                    Action/Plan: Pt discharging home with [facility] services. CM met with the patient and provided her a list of [facility] agencies. She selected [facility]. Santiago Glad with [facility] notified and accepted the referral. Patient has transportation home and her husband can assist her at home per pt.   Expected Discharge Date:  05/13/17               Expected Discharge Plan:  [place]  In-House Referral:     Discharge planning Services  CM Consult  Post Acute Care Choice:  Home Health Choice offered to:  Patient  DME Arranged:    DME Agency:     HH Arranged:  PT, OT HH Agency:  [place]  Status of Service:  Completed, signed off  If discussed at [place] of Stay Meetings, dates discussed:    Additional Comments:  Pollie Friar, RN 05/13/2017, 4:02 PM

## 2017-05-13 NOTE — Progress Notes (Signed)
PT Cancellation Note  Patient Details Name: Lise AuerRuby J Wake MRN: [id number] DOB: 03/15/1936   Cancelled Treatment:    Reason Eval/Treat Not Completed: Pain limiting ability to participate;Fatigue/lethargy limiting ability to participate.  Attempted to see patient x2 today.  Patient declined due to HA in am, and "just don't feel like it" in pm.  Encouraged patient to mobilize to try stairs, and to help with constipation.  Patient continued to decline.  Patient to have f/u HHPT at d/c.   Vena AustriaSusan H Roper Tolson 05/13/2017, 3:41 PM Durenda HurtSusan H. Renaldo Fiddleravis, PT, [facility] Center[facility] Acute Rehab Services Pager [phone]

## 2017-05-13 NOTE — Discharge Instructions (Signed)
1. Home therapies ordered 2. Take Cipro for now instead of Bactrim for urine infection. 3. Follow up with your urology. 4. Do not take blood pressure medicine for now.  5. Follow up with your regular doctor for blood pressure, diabetes, and urology referral in 1 - 2 weeks. 6. See Dr Gerilyn Pilgrimoonquah as needed or as instructed. 7. Increase activity gradually.  8. Let your family doctor know if your cough continues. 9. CBC blood test next office visit.

## 2017-05-13 NOTE — Discharge Summary (Signed)
Stroke Discharge Summary  Patient ID: [name] [name]   MRN: [id number]      DOB: [birth year]  Date of Admission: 05/10/2017 Date of Discharge: 05/13/2017  Attending Physician:  Marvel Plan, MD, Stroke MD Consultant(s):    None  Patient's PCP:  Oval Linsey, MD  DISCHARGE DIAGNOSIS:  Active Problems:   Recurrent UTI   Recrudescence of old right thalamic stroke s/p tPA   Pulmonary edema   HTN   HLD   DM   psoriasis   BMI: Body mass index is 33.53 kg/m.  Past Medical History:  Diagnosis Date  . Arthritis   . CAD (coronary artery disease)    Multivessel status post CABG 2001  . Chronic obstructive pulmonary disease (HCC)   . DDD (degenerative disc disease), lumbar   . Diastolic CHF (HCC)   . Essential hypertension   . Hematuria   . History of CVA (cerebrovascular accident)    2013 - lacunar infarct right thalamus - residual left side of  lip numb  and left eye vision worse (which corrented lens implant from cataract extraction)  . History of MI (myocardial infarction)    July 2001  . Hyperlipidemia   . Hypothyroidism   . Insulin dependent type 2 diabetes mellitus (HCC)   . Lesion of bladder   . Lower urinary tract symptoms (LUTS)   . OSA (obstructive sleep apnea)   . Peripheral neuropathy   . Psoriasis   . SUI (stress urinary incontinence, female)   . Varicose veins    Past Surgical History:  Procedure Laterality Date  . APPENDECTOMY  age 44  . CARDIAC CATHETERIZATION  1991   No sig. cad  . CARDIAC CATHETERIZATION  07-01-2000   high grade in-stent restenosis  . CARDIAC CATHETERIZATION  04-30-2001   single native vessel cad/ graft patent  . CARDIAC CATHETERIZATION  02-13-2006  dr [place] cad with total occluded LAD  and  70% ostial of small ramus/  grafts patent/  normal ef  . CARDIAC CATHETERIZATION  01-20-2000  dr Clifton James   essentially unchanged;  severe single vessel cad with diffuse disease throughout CFX and RCA/ ef 66%;  chf with preserved  LVSF  . CARDIOVASCULAR STRESS TEST  08-28-2011   DR ROTHBART   NEGATIVE NUCLEAR STUDY/  GLOBAL LVSF/  EF 65%  . CARPAL TUNNEL RELEASE Bilateral yrs ago  . CATARACT EXTRACTION W/ INTRAOCULAR LENS  IMPLANT, BILATERAL  2013  . CHOLECYSTECTOMY  1990  . COLONOSCOPY N/A 07/26/2015   Procedure: COLONOSCOPY;  Surgeon: Malissa Hippo, MD;  [facility];  Service: Endoscopy;  Laterality: N/A;  1200  . CORONARY ANGIOPLASTY  11-13-1999   balloon angioplasty to mLAD , d2 of LAD  . CORONARY ANGIOPLASTY WITH STENT PLACEMENT  01-17-2000   PCI stenting to mLAD & D2 of LAD  . CORONARY ARTERY BYPASS GRAFT  07-02-2000   DR Tressie Stalker   SVG to diagonal, LIMA to LAD  . CYSTOSCOPY W/ RETROGRADES Bilateral 08/09/2014   Procedure: CYSTOSCOPY, BILATERAL RETROGRADE, HYDRODISTENSION, BLADDER BIOPSY WITH FULGERATION, INSTILL PYRIDIUM AND MARCAINE ;  Surgeon: Anner Crete, MD;  Location: [facility];  Service: Urology;  Laterality: Bilateral;  . KNEE ARTHROSCOPY Left 10-18-2003   SYNOVECTOMY  . LUMBAR SPINE SURGERY  x2    yrs ago  . ORIF HUMERUS FRACTURE Right 04-18-2008  . REPAIR FLEXOR TENDON HAND Right 09-20-2008   CARPI RADIALIS TENDON TRANSFER TO EXTENSOR TO INDEX, MIDDLE,  RING , LITTLE FINGERS AND DIGITI MININI  . ROTATOR CUFF REPAIR Right 1995  . TOTAL ABDOMINAL HYSTERECTOMY W/ BILATERAL SALPINGOOPHORECTOMY  age [age]  . TRANSTHORACIC ECHOCARDIOGRAM  12-12-2013   MILD LVH/  EF 60-65%/  GRADE I DIASTOLIC DYSFUNCTION/  MILD LAE    Allergies as of 05/13/2017      Reactions   Codeine Itching      Medication List    STOP taking these medications   ALEVE 220 MG tablet Generic drug:  naproxen sodium   lisinopril 10 MG tablet Commonly known as:  PRINIVIL,ZESTRIL   sulfamethoxazole-trimethoprim 800-160 MG tablet Commonly known as:  BACTRIM DS,SEPTRA DS     TAKE these medications   albuterol (2.5 MG/3ML) 0.083% nebulizer solution Commonly known as:  PROVENTIL Take 3 mLs (2.5 mg  total) by nebulization every 4 (four) hours as needed for wheezing or shortness of breath.   aspirin EC 325 MG tablet Take 325 mg by mouth daily.   ciprofloxacin 500 MG tablet Commonly known as:  CIPRO Take 1 tablet (500 mg total) by mouth 2 (two) times daily.   ferrous sulfate 325 (65 FE) MG tablet Take 1 tablet (325 mg total) by mouth 2 (two) times daily with a meal.   guaiFENesin 100 MG/5ML Soln Commonly known as:  ROBITUSSIN Take 10 mLs (200 mg total) by mouth every 6 (six) hours as needed for cough or to loosen phlegm.   HYDROcodone-acetaminophen 10-325 MG tablet Commonly known as:  NORCO Take 1 tablet by mouth every 6 (six) hours as needed (chronic back pain).   insulin NPH-regular Human (70-30) 100 UNIT/ML injection Commonly known as:  NOVOLIN 70/30 Inject 40 Units into the skin 2 (two) times daily.   levothyroxine 25 MCG tablet Commonly known as:  SYNTHROID, LEVOTHROID Take 25 mcg by mouth daily.   LORazepam 1 MG tablet Commonly known as:  ATIVAN Take 1 mg by mouth at bedtime as needed for sleep.   metFORMIN 500 MG tablet Commonly known as:  GLUCOPHAGE Take 500 mg by mouth 2 (two) times daily with a meal.   nitroGLYCERIN 0.4 MG SL tablet Commonly known as:  NITROSTAT Place 0.4 mg under the tongue every 5 (five) minutes as needed for chest pain.   OVER THE COUNTER MEDICATION Place 1 drop into both eyes 3 (three) times daily as needed (eye drops). OTC lubricating eye drops   OVER THE COUNTER MEDICATION Apply 1 application topically 2 (two) times daily as needed (itching/psoriasis flares). OTC Psoriasis cream   pravastatin 40 MG tablet Commonly known as:  PRAVACHOL Take 1 tablet (40 mg total) by mouth at bedtime. What changed:  medication strength  how much to take       LABORATORY STUDIES CBC    Component Value Date/Time   WBC 10.6 (H) 05/13/2017 0356   RBC 4.53 05/13/2017 0356   HGB 9.0 (L) 05/13/2017 0356   HCT 31.6 (L) 05/13/2017 0356   PLT  347 05/13/2017 0356   MCV 69.8 (L) 05/13/2017 0356   MCH 19.9 (L) 05/13/2017 0356   MCHC 28.5 (L) 05/13/2017 0356   RDW 18.4 (H) 05/13/2017 0356   LYMPHSABS 3.7 05/10/2017 1333   MONOABS 1.2 (H) 05/10/2017 1333   EOSABS 0.7 05/10/2017 1333   BASOSABS 0.1 05/10/2017 1333   CMP    Component Value Date/Time   NA 136 05/13/2017 0356   K 4.4 05/13/2017 0356   CL 99 (L) 05/13/2017 0356   CO2 30 05/13/2017 0356   GLUCOSE 189 (  H) 05/13/2017 0356   BUN 9 05/13/2017 0356   CREATININE 0.83 05/13/2017 0356   CALCIUM 9.2 05/13/2017 0356   PROT 6.8 05/10/2017 1333   ALBUMIN 3.8 05/10/2017 1333   AST 16 05/10/2017 1333   ALT 13 (L) 05/10/2017 1333   ALKPHOS 66 05/10/2017 1333   BILITOT 0.3 05/10/2017 1333   GFRNONAA >60 05/13/2017 0356   GFRAA >60 05/13/2017 0356   COAGS Lab Results  Component Value Date   INR 1.09 05/10/2017   INR 0.96 09/19/2016   INR 1.05 08/01/2016   Lipid Panel    Component Value Date/Time   CHOL 158 05/11/2017 0234   TRIG 173 (H) 05/11/2017 0234   HDL 26 (L) 05/11/2017 0234   CHOLHDL 6.1 05/11/2017 0234   VLDL 35 05/11/2017 0234   LDLCALC 97 05/11/2017 0234   HgbA1C  Lab Results  Component Value Date   HGBA1C 7.8 (H) 05/11/2017   Urinalysis    Component Value Date/Time   COLORURINE YELLOW 05/10/2017 2212   APPEARANCEUR HAZY (A) 05/10/2017 2212   LABSPEC 1.010 05/10/2017 2212   PHURINE 5.0 05/10/2017 2212   GLUCOSEU NEGATIVE 05/10/2017 2212   HGBUR MODERATE (A) 05/10/2017 2212   BILIRUBINUR NEGATIVE 05/10/2017 2212   KETONESUR NEGATIVE 05/10/2017 2212   PROTEINUR NEGATIVE 05/10/2017 2212   UROBILINOGEN 0.2 08/29/2015 1842   NITRITE POSITIVE (A) 05/10/2017 2212   LEUKOCYTESUR LARGE (A) 05/10/2017 2212   Urine Drug Screen     Component Value Date/Time   LABOPIA NONE DETECTED 09/19/2016 1640   COCAINSCRNUR NONE DETECTED 09/19/2016 1640   LABBENZ NONE DETECTED 09/19/2016 1640   AMPHETMU NONE DETECTED 09/19/2016 1640   THCU NONE DETECTED  09/19/2016 1640   LABBARB NONE DETECTED 09/19/2016 1640    Alcohol Level    Component Value Date/Time   ETH <5 05/10/2017 1333     SIGNIFICANT DIAGNOSTIC STUDIES I have personally reviewed the radiological images below and agree with the radiology interpretations.  Ct Head Wo Contrast 05/10/2017 No acute intracranial abnormalities. Stable appearance from the study obtained earlier today.   Ct Head Wo Contrast 05/10/2017 No acute intracranial findings. Moderate chronic ischemic microvascular disease.   MRI and MRA 05/12/2017 1. No acute intracranial abnormality. 2. The study is severely degraded by patient motion. 3. Extensive white matter disease likely reflects the sequela of chronic microvascular ischemia. 4. Atherosclerotic changes within the cavernous internal carotid arteries bilaterally. 5. The vertebrobasilar junction is normal. 6. Circle Willis is obscured by severe patient motion. The time-of-flight images are nondiagnostic above the level of the cavernous sinus.  CUS  05/12/2017 1-39 percent stenosis involving the right internal carotid artery and the left internal carotid artery. The vertebral arteries demonstrate antegrade flow.  TTE  05/11/2017 Study Conclusions - Left ventricle: The cavity size was normal. Wall thickness was increased in a pattern of mild LVH. Systolic function was normal. The estimated ejection fraction was in the range of 55% to 60%. Wall motion was normal; there were no regional wall motion abnormalities. Doppler parameters are consistent with abnormal left ventricular relaxation (grade 1 diastolic dysfunction). Doppler parameters are consistent with high ventricular filling pressure. - Mitral valve: Calcified annulus. There was mild regurgitation. - Left atrium: The atrium was mildly dilated. Impressions: - Normal LV systolic function; mild diastolic dysfunction; mild LVH; sclerotic aortic valve; mild MR; mild LAE;  mild TR.     HISTORY OF PRESENT ILLNESS This is a 80-yo RH woman who presented to [facility] ED at 1330 on  05/10/2017. She complained of numbness of the left face and arm associated with headache and dizziness and a "funny" feeling in her left eye. On ED MD exam, she was noted to have mild confusion with some paresthesias on the left side of her face and decreased sensation on the left side. She had good grip strength bilaterally. Teleneurology consultation was obtained and advised CODE STROKE activation. She was sent for emergent [facility] which reportedly showed no acute abnormality. She had a reported NIHSS score of 7. Blood pressure was never elevated with SBP 95-116 at [facility]. The decision was made to treat with tPA and she was given a total dose of 78 mg with 7.8 mg bolus at 1402 followed by infusion of 70.2 mg over the next hour. She was transferred to [facility] for admission and further management.   En route to [facility], transport RN called the receiving RN and reported that the patient complained of a worsening headache. This has since improved. She still has some moderate headache and says that it is hard for her to think. She continues to have numbness in her left face and hand and feels like she is having trouble getting her words out.   Last known well: about 1230 today NHISS score: 4 tPA given?: Yes, given at [facility].   HOSPITAL COURSE Ms. GIRL SCHISSLER is a 81 y.o. female with history of peripheral neuropathy, obstructive sleep apnea, diabetes mellitus, hypothyroidism, hyperlipidemia, coronary artery disease with MI, previous stroke, hypertension, congestive heart failure, and COPD, presenting with left sided numbness, headache, dizziness, and mild confusion.  She received TPA - Saturday, 05/10/2017 at 1400 hrs at [facility].  Recrudescence from old right thalamic infarct   Resultant  Subjective left sided paresthesia  CT head - No acute intracranial findings.  MRI  head - No acute intracranial abnormality.  MRA head - Circle [name] is obscured by severe patient motion. No high-grade stenosis identified.  Carotid Doppler - unremarkable  2D Echo - EF 55 - 60 %. No cardiac source of emboli identified.  LDL - 97  HgbA1c - 7.8  VTE prophylaxis - SCDs  Diet heart healthy/carb modified Room service appropriate? Yes; Fluid consistency: Thin  aspirin 325 mg daily prior to admission, now on No antithrombotic at this time secondary to TPA therapy.  Patient counseled to be compliant with her antithrombotic medications  Ongoing aggressive stroke risk factor management  Therapy recommendations: Home health PT and OT.  Disposition: Discharged to home.  Following with neurology Dr. Gerilyn Pilgrim as outpt   Hx of stroke / recrudescence of stroke  Right thalamic infarct 2013  Frequent episode of recrudescence of stroke symptoms - seen by Dr. Gerilyn Pilgrim  Suspicious left pontine infarct in 07/2016  Has been following with Dr. Gerilyn Pilgrim  On ASA and pravastatin at home  Recurrent UTI  UA WBCTMTC   Pt denies prophylactic Abx at home  However, bactrium DS daily is on her meds list  Urine culture > 100,000 col. Citrobacter Freundii  Started on Cipro 500 mg twice daily for 7 days per pharmacy recommendation. Stop Bactrim.  Recommend outpt urology f/u  Psoriasis  Itchy rashes in extremities  Resume topical Eucerin.   Hypertension  Blood pressure tends to run mildly low.  Long-term BP goal normotensive  Hyperlipidemia  Home meds:  Pravachol 20 mg daily resumed in hospital  LDL 97, goal < 70  Increase Pravachol to 40 mg daily  Continue statin at discharge  Diabetes  Home meds -  insulin NPH bid  HgbA1c 7.8 goal < 7.0  uncontrolled  CBG stable  SSI  CBG monitoring  Resume insulin on discharge  Other Stroke Risk Factors  Advanced age  Obesity, Body mass index is 33.53 kg/m., recommend weight loss, diet and exercise  as appropriate   Family hx stroke (sister and mother)  Coronary artery disease  Obstructive sleep apnea  Other Active Problems  Mild leukocytosis - 11.4 (afebrile) -> 10.6  Cough -> Robitussin prn  DISCHARGE EXAM Blood pressure (!) 124/59, pulse 73, temperature 97.9 F (36.6 C), temperature source Oral, resp. rate 20, height 5\' 4"  (1.626 m), weight 88.6 kg (195 lb 5.2 oz), SpO2 100 %. General - Well nourished, well developed, in no apparent distress.  Ophthalmologic - Sharp disc margins OU.  Cardiovascular - Regular rate and rhythm.  Skin - multiple psoriasic rashes at b/l LEs and LUE.   Mental Status -  Level of arousal and orientation to time, place, and person were intact. Language including expression, naming, repetition, comprehension was assessed and found intact. Fund of Knowledge was assessed and was intact.  Cranial Nerves II - XII - II - Visual field intact OU. III, IV, VI - Extraocular movements intact. V - Facial sensation intact bilaterally, subjective left facial numbness feeling. VII - right nasolabial fold flattening at rest, but symmetric on facial movement. VIII - Hearing & vestibular intact bilaterally. X - Palate elevates symmetrically. XI - Chin turning & shoulder shrug intact bilaterally. XII - Tongue protrusion intact.  Motor Strength - The patient's strength was normal in all extremities and pronator drift was absent.  Bulk was normal and fasciculations were absent.   Motor Tone - Muscle tone was assessed at the neck and appendages and was normal.  Reflexes - The patient's reflexes were 1+ in all extremities and she had no pathological reflexes.  Sensory - Light touch, temperature/pinprick were assessed and were symmetrical, but subjective left arm numbness feeling.    Coordination - The patient had normal movements in the hands with no ataxia or dysmetria.  Tremor was absent.  Gait and Station - deferred.  Discharge instructions  to the patient: 1. Home therapies ordered 2. Take Cipro for now instead of Bactrim for urine infection. 3. Follow up with your urology. 4. Do not take blood pressure medicine for now.  5. Follow up with your regular doctor for blood pressure, diabetes, and urology referral in 1 - 2 weeks. 6. See Dr Gerilyn Pilgrim as needed or as instructed. 7. Increase activity gradually.  8. Let your family doctor know if your cough continues. 9. CBC blood test next office visit.  Discharge Diet   Diet heart healthy/carb modified Room service appropriate? Yes; Fluid consistency: Thin liquids  DISCHARGE PLAN  Disposition:  Discharge to home  aspirin 325 mg daily for secondary stroke prevention.  Ongoing risk factor control by Primary Care Physician at time of discharge  Follow-up Oval Linsey, MD in 2 weeks.  Follow up with Dr. Gerilyn Pilgrim as needed  40 minutes were spent preparing discharge.  Marvel Plan, MD PhD Stroke Neurology 05/13/2017 12:37 PM

## 2017-05-13 NOTE — Care Management Important Message (Signed)
Important Message  Patient Details  Name: [name] AuerRuby J Sigel MRN: [id number] Date of Birth: [birth year]   Medicare Important Message Given:  Yes    Porter Nakama Stefan ChurchBratton 05/13/2017, 11:42 AM

## 2017-05-16 ENCOUNTER — Ambulatory Visit: Payer: Medicare HMO | Admitting: Urology

## 2017-05-19 ENCOUNTER — Emergency Department (HOSPITAL_COMMUNITY): Payer: Medicare HMO

## 2017-05-19 ENCOUNTER — Encounter (HOSPITAL_COMMUNITY): Payer: Self-pay

## 2017-05-19 ENCOUNTER — Emergency Department (HOSPITAL_COMMUNITY)
Admission: EM | Admit: 2017-05-19 | Discharge: 2017-05-19 | Disposition: A | Discharge status: Home / Self Care | Payer: Medicare HMO | Attending: Emergency Medicine | Admitting: Emergency Medicine

## 2017-05-19 DIAGNOSIS — I5030 Unspecified diastolic (congestive) heart failure: Secondary | ICD-10-CM | POA: Insufficient documentation

## 2017-05-19 DIAGNOSIS — Z79899 Other long term (current) drug therapy: Secondary | ICD-10-CM | POA: Insufficient documentation

## 2017-05-19 DIAGNOSIS — M546 Pain in thoracic spine: Secondary | ICD-10-CM | POA: Insufficient documentation

## 2017-05-19 DIAGNOSIS — I2510 Atherosclerotic heart disease of native coronary artery without angina pectoris: Secondary | ICD-10-CM | POA: Diagnosis not present

## 2017-05-19 DIAGNOSIS — E119 Type 2 diabetes mellitus without complications: Secondary | ICD-10-CM | POA: Insufficient documentation

## 2017-05-19 DIAGNOSIS — J449 Chronic obstructive pulmonary disease, unspecified: Secondary | ICD-10-CM | POA: Insufficient documentation

## 2017-05-19 DIAGNOSIS — E039 Hypothyroidism, unspecified: Secondary | ICD-10-CM | POA: Diagnosis not present

## 2017-05-19 DIAGNOSIS — L409 Psoriasis, unspecified: Secondary | ICD-10-CM | POA: Insufficient documentation

## 2017-05-19 DIAGNOSIS — R05 Cough: Secondary | ICD-10-CM | POA: Diagnosis not present

## 2017-05-19 DIAGNOSIS — Z794 Long term (current) use of insulin: Secondary | ICD-10-CM | POA: Diagnosis not present

## 2017-05-19 DIAGNOSIS — M549 Dorsalgia, unspecified: Secondary | ICD-10-CM | POA: Diagnosis present

## 2017-05-19 DIAGNOSIS — R110 Nausea: Secondary | ICD-10-CM | POA: Diagnosis not present

## 2017-05-19 DIAGNOSIS — I110 Hypertensive heart disease with heart failure: Secondary | ICD-10-CM | POA: Insufficient documentation

## 2017-05-19 DIAGNOSIS — Z7982 Long term (current) use of aspirin: Secondary | ICD-10-CM | POA: Insufficient documentation

## 2017-05-19 DIAGNOSIS — R109 Unspecified abdominal pain: Secondary | ICD-10-CM | POA: Insufficient documentation

## 2017-05-19 LAB — URINALYSIS, ROUTINE W REFLEX MICROSCOPIC
BACTERIA UA: NONE SEEN
Bilirubin Urine: NEGATIVE
Glucose, UA: NEGATIVE mg/dL
HGB URINE DIPSTICK: NEGATIVE
Ketones, ur: NEGATIVE mg/dL
Nitrite: NEGATIVE
Protein, ur: NEGATIVE mg/dL
Specific Gravity, Urine: 1.018 (ref 1.005–1.030)
pH: 5 (ref 5.0–8.0)

## 2017-05-19 LAB — CBC WITH DIFFERENTIAL/PLATELET
BASOS ABS: 0 10*3/uL (ref 0.0–0.1)
Basophils Relative: 0 %
EOS ABS: 0.7 10*3/uL (ref 0.0–0.7)
EOS PCT: 8 %
HCT: 31.9 % — ABNORMAL LOW (ref 36.0–46.0)
Hemoglobin: 9.1 g/dL — ABNORMAL LOW (ref 12.0–15.0)
Lymphocytes Relative: 22 %
Lymphs Abs: 1.9 10*3/uL (ref 0.7–4.0)
MCH: 19.6 pg — ABNORMAL LOW (ref 26.0–34.0)
MCHC: 28.5 g/dL — ABNORMAL LOW (ref 30.0–36.0)
MCV: 68.6 fL — ABNORMAL LOW (ref 78.0–100.0)
Monocytes Absolute: 0.8 10*3/uL (ref 0.1–1.0)
Monocytes Relative: 9 %
NEUTROS PCT: 61 %
Neutro Abs: 5.3 10*3/uL (ref 1.7–7.7)
PLATELETS: 363 10*3/uL (ref 150–400)
RBC: 4.65 MIL/uL (ref 3.87–5.11)
RDW: 18.4 % — AB (ref 11.5–15.5)
WBC: 8.7 10*3/uL (ref 4.0–10.5)

## 2017-05-19 LAB — BASIC METABOLIC PANEL
Anion gap: 11 (ref 5–15)
BUN: 16 mg/dL (ref 6–20)
CALCIUM: 9.1 mg/dL (ref 8.9–10.3)
CO2: 23 mmol/L (ref 22–32)
Chloride: 106 mmol/L (ref 101–111)
Creatinine, Ser: 0.92 mg/dL (ref 0.44–1.00)
GFR calc non Af Amer: 58 mL/min — ABNORMAL LOW (ref >60)
GLUCOSE: 130 mg/dL — AB (ref 65–99)
POTASSIUM: 4.3 mmol/L (ref 3.5–5.1)
SODIUM: 140 mmol/L (ref 135–145)

## 2017-05-19 LAB — LIPASE, BLOOD: LIPASE: 22 U/L (ref 11–51)

## 2017-05-19 MED ORDER — CYCLOBENZAPRINE HCL 10 MG PO TABS
5.0000 mg | ORAL_TABLET | Freq: Once | ORAL | Status: AC
  Administered 2017-05-19: 5 mg via ORAL
  Filled 2017-05-19: qty 1

## 2017-05-19 MED ORDER — HYDROCODONE-ACETAMINOPHEN 5-325 MG PO TABS: 1.0000 | ORAL_TABLET | ORAL | 0 refills | Status: DC | PRN

## 2017-05-19 MED ORDER — HYDROMORPHONE HCL 1 MG/ML IJ SOLN
INTRAMUSCULAR | Status: AC
  Filled 2017-05-19: qty 1

## 2017-05-19 MED ORDER — ONDANSETRON 4 MG PO TBDP
4.0000 mg | ORAL_TABLET | Freq: Once | ORAL | Status: AC
  Administered 2017-05-19: 4 mg via ORAL
  Filled 2017-05-19: qty 1

## 2017-05-19 MED ORDER — HYDROMORPHONE HCL 1 MG/ML IJ SOLN
0.5000 mg | Freq: Once | INTRAMUSCULAR | Status: AC
  Administered 2017-05-19: 0.5 mg via INTRAVENOUS

## 2017-05-19 MED ORDER — MORPHINE SULFATE (PF) 4 MG/ML IV SOLN: 4.0000 mg | Freq: Once | INTRAVENOUS | Status: DC

## 2017-05-19 MED ORDER — CYCLOBENZAPRINE HCL 5 MG PO TABS: 5.0000 mg | ORAL_TABLET | Freq: Two times a day (BID) | ORAL | 0 refills | Status: DC | PRN

## 2017-05-19 MED ORDER — SODIUM CHLORIDE 0.9 % IV BOLUS (SEPSIS)
500.0000 mL | Freq: Once | INTRAVENOUS | Status: AC
  Administered 2017-05-19: 500 mL via INTRAVENOUS

## 2017-05-19 MED ORDER — FENTANYL CITRATE (PF) 100 MCG/2ML IJ SOLN
50.0000 ug | INTRAMUSCULAR | Status: DC | PRN
Start: 2017-05-19 — End: 2017-05-19
  Administered 2017-05-19: 50 ug via INTRAVENOUS
  Filled 2017-05-19: qty 2

## 2017-05-19 MED ORDER — ACETAMINOPHEN 500 MG PO TABS
1000.0000 mg | ORAL_TABLET | Freq: Once | ORAL | Status: AC
  Administered 2017-05-19: 1000 mg via ORAL
  Filled 2017-05-19: qty 2

## 2017-05-19 NOTE — Discharge Instructions (Signed)
If you were given medicines take as directed.  If you are on coumadin or contraceptives realize their levels and effectiveness is altered by many different medicines.  If you have any reaction (rash, tongues swelling, other) to the medicines stop taking and see a physician.    If your blood pressure was elevated in the ER make sure you follow up for management with a primary doctor or return for chest pain, shortness of breath or stroke symptoms.  Please follow up as directed and return to the ER or see a physician for new or worsening symptoms.  Thank you. Vitals:   05/19/17 0813 05/19/17 0814  BP:  (!) 142/88  Pulse:  (!) 59  Resp:  14  Temp:  97.7 F (36.5 C)  TempSrc:  Oral  SpO2:  100%  Weight: 87.1 kg (192 lb)   Height: 5\' 4"  (1.626 m)

## 2017-05-19 NOTE — ED Triage Notes (Addendum)
Pt report she just recently gotten released from hospital for stroke. In today for complaints of mid back pain that started 2 days ago and has increased. Pain mid to left side of back. Also has hx of fx ribs on that side. Reports N/V. Has taken BC powder for pain. Pt also reports cough

## 2017-05-19 NOTE — ED Provider Notes (Signed)
[facility] Provider Note   CSN: [id number] Arrival date & time: 05/19/17  4010  By signing my name below, I, Deland Pretty, attest that this documentation has been prepared under the direction and in the presence of Blane Ohara, MD. Electronically Signed: Deland Pretty, [name] 05/19/17. 9:13 AM.  History   Chief Complaint Chief Complaint  Patient presents with  . Back Pain   The history is provided by the patient. No language interpreter was used.     HPI Comments: [name] [name] is a 81 y.o. female, with a hx of CAD, DM, HLD, MI, PNA, cystitis, and CVA presents to the Emergency Department complaining of severe, gradually worsening left upper back pain (behind the breast) with associated nausea and cough that began 2 days ago. Bodily movement exacerbeates her pain. Her CVA occurred on 05/10/2017, and the pt was diagnosed with acute cystitis on 03/11/2017 where she reports that she finished her antibiotics to completion. The pt has a PSHx of cardiac catheterization, cardiac stress test, and appendectomy. The pt currently is prescribed daily 325mg  of aspirin. She additionally indicates a mechanical fall that occurred this morning, but doubts that her current pain is related. Otherwise, she denies recent injury. The pt denies a PMHx of PE, DVT, and nephrolithiasis. She also denies chest pain, abdomninal pain, numbness, fever, and weakness.  Past Medical History:  Diagnosis Date  . Arthritis   . CAD (coronary artery disease)    Multivessel status post CABG 2001  . Chronic obstructive pulmonary disease (HCC)   . DDD (degenerative disc disease), lumbar   . Diastolic CHF (HCC)   . Essential hypertension   . Hematuria   . History of CVA (cerebrovascular accident)    2013 - lacunar infarct right thalamus - residual left side of  lip numb  and left eye vision worse (which corrented lens implant from cataract extraction)  . History of MI (myocardial infarction)    July  2001  . Hyperlipidemia   . Hypothyroidism   . Insulin dependent type 2 diabetes mellitus (HCC)   . Lesion of bladder   . Lower urinary tract symptoms (LUTS)   . OSA (obstructive sleep apnea)   . Peripheral neuropathy   . Psoriasis   . SUI (stress urinary incontinence, female)   . Varicose veins     Patient Active Problem List   Diagnosis Date Noted  . Ischemic stroke (HCC) 05/10/2017  . Acute ischemic stroke (HCC) 05/10/2017  . Stroke (HCC) 08/01/2016  . Dysarthria 03/18/2016  . Influenza-like illness 11/19/2015  . Anemia of chronic disease 11/19/2015  . OSA (obstructive sleep apnea) 11/19/2015  . Left facial numbness 08/29/2015  . Gait instability 06/26/2015  . Bronchitis 11/13/2014  . COPD exacerbation (HCC) 11/13/2014  . UTI (urinary tract infection) 08/26/2014  . Near syncope 08/24/2014  . UTI (lower urinary tract infection) 12/10/2013  . Hypoxia 12/10/2013  . Weakness generalized 12/10/2013  . TIA (transient ischemic attack) 06/22/2012  . Left-sided headache 06/22/2012  . Noncompliance 06/22/2012  . Noncompliance with CPAP treatment 06/22/2012  . Psoriasis 01/07/2012  . Cerebrovascular disease   . Chronic back pain   . Chronic obstructive pulmonary disease (HCC)   . Chest pain 08/28/2011  . DM type 2 causing complication (HCC) 02/14/2010  . Obesity 02/14/2010  . Arteriosclerotic cardiovascular disease (ASCVD) 02/14/2010  . Hypothyroidism 02/06/2010  . Hyperlipidemia 02/06/2010  . Essential hypertension 02/06/2010  . Sleep apnea 02/06/2010    Past Surgical History:  Procedure Laterality Date  .  APPENDECTOMY  age 26  . CARDIAC CATHETERIZATION  1991   No sig. cad  . CARDIAC CATHETERIZATION  07-01-2000   high grade in-stent restenosis  . CARDIAC CATHETERIZATION  04-30-2001   single native vessel cad/ graft patent  . CARDIAC CATHETERIZATION  02-13-2006  dr [place] cad with total occluded LAD  and  70% ostial of small ramus/  grafts patent/  normal ef  .  CARDIAC CATHETERIZATION  01-20-2000  dr Clifton James   essentially unchanged;  severe single vessel cad with diffuse disease throughout CFX and RCA/ ef 66%;  chf with preserved LVSF  . CARDIOVASCULAR STRESS TEST  08-28-2011   DR ROTHBART   NEGATIVE NUCLEAR STUDY/  GLOBAL LVSF/  EF 65%  . CARPAL TUNNEL RELEASE Bilateral yrs ago  . CATARACT EXTRACTION W/ INTRAOCULAR LENS  IMPLANT, BILATERAL  2013  . CHOLECYSTECTOMY  1990  . COLONOSCOPY N/A 07/26/2015   Procedure: COLONOSCOPY;  Surgeon: Malissa Hippo, MD;  Location: [facility];  Service: Endoscopy;  Laterality: N/A;  1200  . CORONARY ANGIOPLASTY  11-13-1999   balloon angioplasty to mLAD , d2 of LAD  . CORONARY ANGIOPLASTY WITH STENT PLACEMENT  01-17-2000   PCI stenting to mLAD & D2 of LAD  . CORONARY ARTERY BYPASS GRAFT  07-02-2000   DR Tressie Stalker   SVG to diagonal, LIMA to LAD  . CYSTOSCOPY W/ RETROGRADES Bilateral 08/09/2014   Procedure: CYSTOSCOPY, BILATERAL RETROGRADE, HYDRODISTENSION, BLADDER BIOPSY WITH FULGERATION, INSTILL PYRIDIUM AND MARCAINE ;  Surgeon: Anner Crete, MD;  Location: [facility];  Service: Urology;  Laterality: Bilateral;  . KNEE ARTHROSCOPY Left 10-18-2003   SYNOVECTOMY  . LUMBAR SPINE SURGERY  x2    yrs ago  . ORIF HUMERUS FRACTURE Right 04-18-2008  . REPAIR FLEXOR TENDON HAND Right 09-20-2008   CARPI RADIALIS TENDON TRANSFER TO EXTENSOR TO INDEX, MIDDLE, RING , LITTLE FINGERS AND DIGITI MININI  . ROTATOR CUFF REPAIR Right 1995  . TOTAL ABDOMINAL HYSTERECTOMY W/ BILATERAL SALPINGOOPHORECTOMY  age 87  . TRANSTHORACIC ECHOCARDIOGRAM  12-12-2013   MILD LVH/  EF 60-65%/  GRADE I DIASTOLIC DYSFUNCTION/  MILD LAE    OB History    Gravida Para Term Preterm AB Living   7 7 7     4    SAB TAB Ectopic Multiple Live Births                   Home Medications    Prior to Admission medications   Medication Sig Start Date End Date Taking? Authorizing Provider  albuterol (PROVENTIL) (2.5 MG/3ML) 0.083%  nebulizer solution Take 3 mLs (2.5 mg total) by nebulization every 4 (four) hours as needed for wheezing or shortness of breath. Patient not taking: Reported on 05/10/2017 04/20/16   Bethann Berkshire, MD  aspirin EC 325 MG tablet Take 325 mg by mouth daily.    [provider]  ciprofloxacin (CIPRO) 500 MG tablet Take 1 tablet (500 mg total) by mouth 2 (two) times daily. 05/13/17   Rinehuls, Kinnie Scales, PA-C  ferrous sulfate 325 (65 FE) MG tablet Take 1 tablet (325 mg total) by mouth 2 (two) times daily with a meal. Patient not taking: Reported on 03/04/2017 09/22/16   Oval Linsey, MD  guaiFENesin (ROBITUSSIN) 100 MG/5ML SOLN Take 10 mLs (200 mg total) by mouth every 6 (six) hours as needed for cough or to loosen phlegm. 05/13/17   Rinehuls, Kinnie Scales, PA-C  HYDROcodone-acetaminophen (NORCO) 10-325 MG tablet Take 1  tablet by mouth every 6 (six) hours as needed (chronic back pain).    [provider]  insulin NPH-regular Human (NOVOLIN 70/30) (70-30) 100 UNIT/ML injection Inject 40 Units into the skin 2 (two) times daily.    [provider]  levothyroxine (SYNTHROID, LEVOTHROID) 25 MCG tablet Take 25 mcg by mouth daily.     [provider]  LORazepam (ATIVAN) 1 MG tablet Take 1 mg by mouth at bedtime as needed for sleep.     [provider]  metFORMIN (GLUCOPHAGE) 500 MG tablet Take 500 mg by mouth 2 (two) times daily with a meal.      [provider]  nitroGLYCERIN (NITROSTAT) 0.4 MG SL tablet Place 0.4 mg under the tongue every 5 (five) minutes as needed for chest pain.     [provider]  OVER THE COUNTER MEDICATION Place 1 drop into both eyes 3 (three) times daily as needed (eye drops). OTC lubricating eye drops    [provider]  OVER THE COUNTER MEDICATION Apply 1 application topically 2 (two) times daily as needed (itching/psoriasis flares). OTC Psoriasis cream    [provider]  pravastatin (PRAVACHOL) 40 MG tablet Take  1 tablet (40 mg total) by mouth at bedtime. 05/13/17   Rinehuls, Kinnie Scales, PA-C    Family History Family History  Problem Relation Age of Onset  . Hypertension Mother   . Stroke Mother   . Coronary artery disease Unknown        Multiple first and second-degree relatives  . Aneurysm Unknown        Cerebral circulation  . Colon cancer Sister   . Stroke Sister   . Coronary artery disease Sister   . Clotting disorder Unknown        Children diagnosed with hypercoagulable state  . Coronary artery disease Brother     Social History Social History  Substance Use Topics  . Smoking status: Never Smoker  . Smokeless tobacco: Never Used  . Alcohol use No     Allergies   Codeine   Review of Systems Review of Systems  Constitutional: Negative for fever.  Respiratory: Positive for cough.   Cardiovascular: Negative for chest pain.  Gastrointestinal: Positive for nausea. Negative for abdominal pain.  Musculoskeletal: Positive for back pain.  Skin: Positive for rash (psoriaisis).  Neurological: Negative for weakness and numbness.  All other systems reviewed and are negative.    Physical Exam Updated Vital Signs BP (!) 142/88 (BP Location: Left Arm)   Pulse (!) 59   Temp 97.7 F (36.5 C) (Oral)   Resp 14   Ht 5\' 4"  (1.626 m)   Wt 192 lb (87.1 kg)   SpO2 100%   BMI 32.96 kg/m   Physical Exam  Constitutional: She is oriented to person, place, and time. She appears well-developed and well-nourished.  HENT:  Head: Normocephalic.  Eyes: EOM are normal.  Neck: Normal range of motion.  Cardiovascular: Normal rate, regular rhythm, normal heart sounds and intact distal pulses.  Exam reveals no gallop and no friction rub.   No murmur heard. Pulmonary/Chest: Effort normal and breath sounds normal. No respiratory distress. She has no wheezes. She has no rales. She exhibits no tenderness.  Abdominal: Soft. She exhibits no distension. There is no tenderness.  Musculoskeletal:  Normal range of motion.  Tenderness to paraspinal mid-thoracic region on her left that is reducible on palpation F/E of hips, knees, and great toes  Neurological: She is alert and oriented  to person, place, and time. No sensory deficit.  Normal strength and sensation No sensory deficits  Skin:  Psoriasis to RLE  Psychiatric: She has a normal mood and affect.  Nursing note and vitals reviewed.    ED Treatments / Results   DIAGNOSTIC STUDIES: Oxygen Saturation is 100% on RA, normal by my interpretation.   COORDINATION OF CARE: 8:26 AM-Discussed next steps with pt. Pt verbalized understanding and is agreeable with the plan.   Labs (all labs ordered are listed, but only abnormal results are displayed) Labs Reviewed - No data to display  EKG  EKG Interpretation None       Radiology No results found.  Procedures Procedures (including critical care time)  Medications Ordered in ED Medications - No data to display   Initial Impression / Assessment and Plan / ED Course  I have reviewed the triage vital signs and the nursing notes.  Pertinent labs & imaging results that were available during my care of the patient were reviewed by me and considered in my medical decision making (see chart for details).    Patient presents with worsening thoracic back pain for 2 days. Reproducible on exam discussed likely musculoskeletal however with age and severe pain, CT scan to look for kidney stone or other pathology. Pain meds given. Patient's pain improved and controlled on recheck. Discussed close follow-up with primary doctor. CT scan, x-ray, blood work, urinalysis overall unremarkable.  Results and differential diagnosis were discussed with the patient/parent/guardian. Xrays were independently reviewed by myself.  Close follow up outpatient was discussed, comfortable with the plan.   Medications  fentaNYL (SUBLIMAZE) injection 50 mcg (50 mcg Intravenous Given 05/19/17 0836)    sodium chloride 0.9 % bolus 500 mL (500 mLs Intravenous New Bag/Given 05/19/17 0836)  ondansetron (ZOFRAN-ODT) disintegrating tablet 4 mg (4 mg Oral Given 05/19/17 0834)    Vitals:   05/19/17 0813 05/19/17 0814  BP:  (!) 142/88  Pulse:  (!) 59  Resp:  14  Temp:  97.7 F (36.5 C)  TempSrc:  Oral  SpO2:  100%  Weight: 87.1 kg (192 lb)   Height: 5\' 4"  (1.626 m)     Final diagnoses:  Acute left-sided thoracic back pain     Final Clinical Impressions(s) / ED Diagnoses   Final diagnoses:  None    New Prescriptions New Prescriptions   No medications on file        Blane OharaZavitz, Pixie Burgener, MD 05/19/17 1145

## 2017-05-19 NOTE — ED Notes (Signed)
Patient discharged and unable to pull patient up in Pyxsis to waste 0.5mg  of hydromorphone. Spoke to Reynolds AmericanLora Seay RPh and she approved documentation of waste in patient record. Waste witnessed by American FinancialEboni Wiley RN. Wasted in sink.

## 2017-05-29 IMAGING — DX DG HAND COMPLETE 3+V*L*
3 series · 3 of 3 positions shown · non-contrast
Comparison: None.

CLINICAL DATA: 78-year-old female with bilateral chronic hand pain.
Initial encounter.

EXAM:
LEFT HAND - COMPLETE 3+ VIEW

[hand pa]
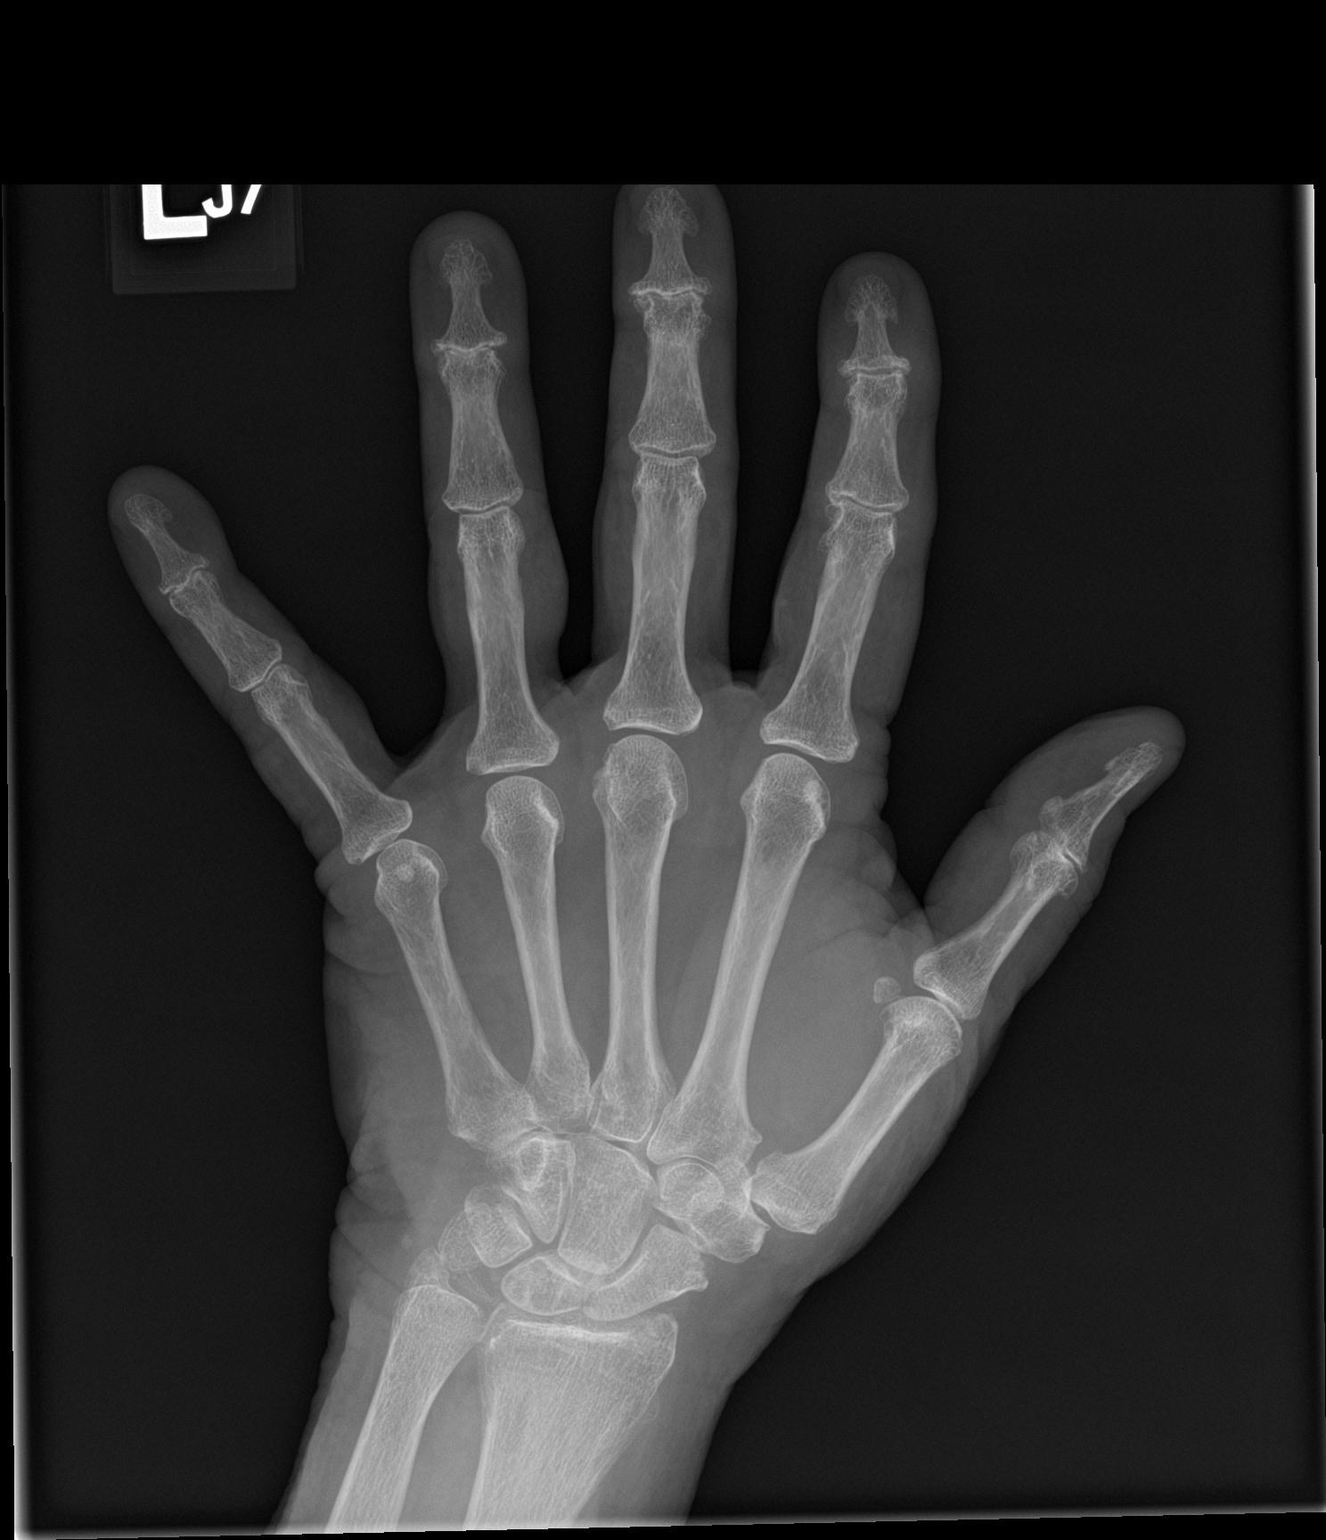

[hand obl]
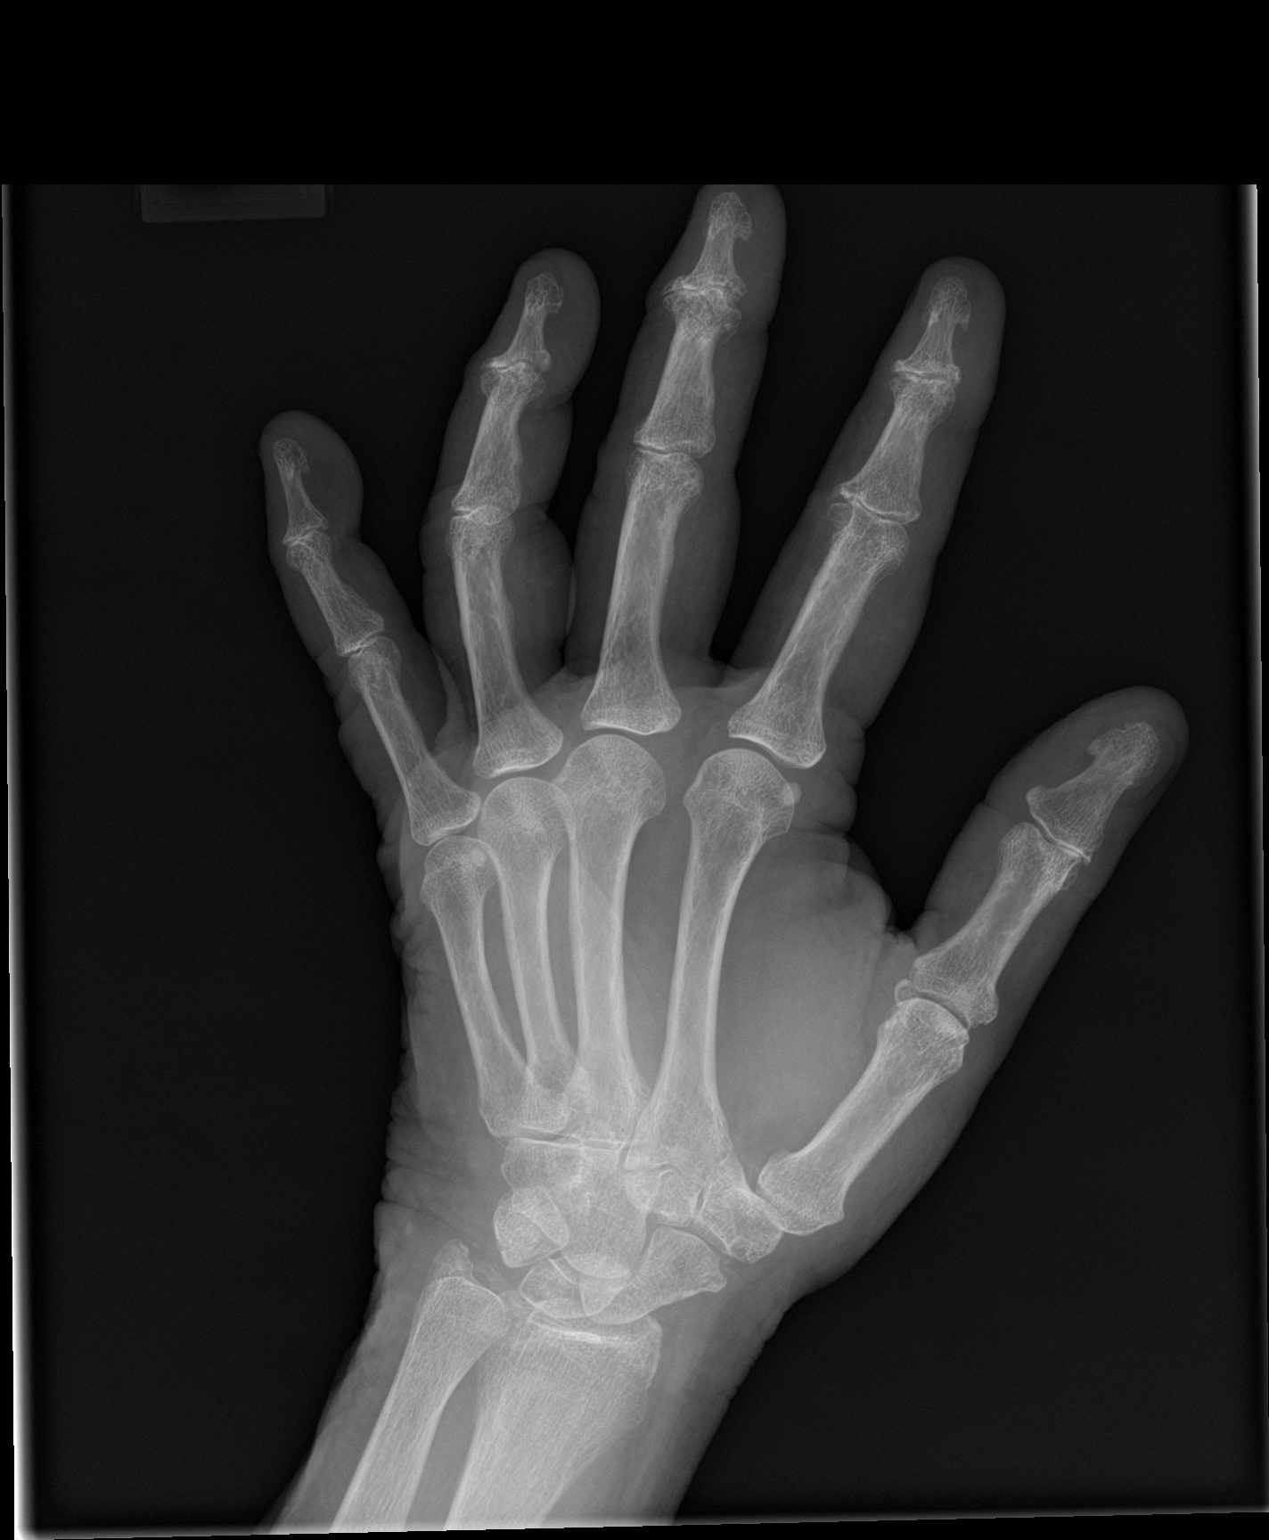

[hand lat]
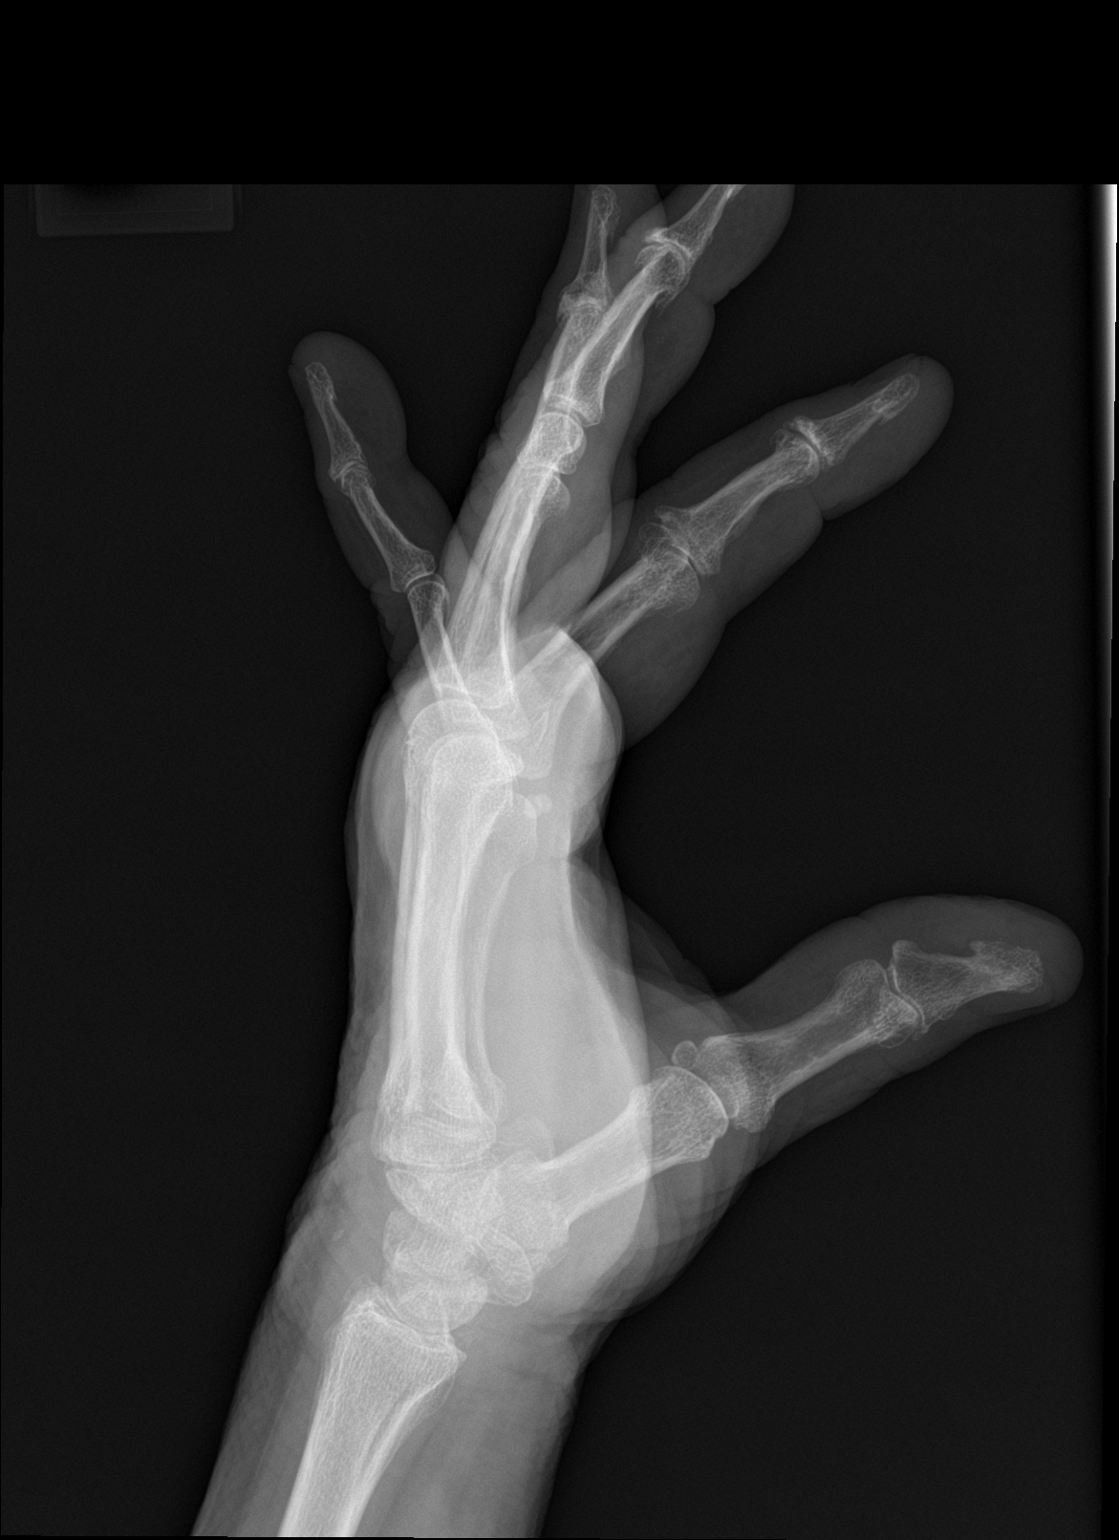

[3 of 3 positions shown; findings below may reference images not displayed]

FINDINGS: Bone mineralization is within normal limits for age. Distal radius
and ulna appear intact. There is chondrocalcinosis at the ulnar
aspect of the left wrist, but carpal joint spaces appear preserved.
MCP joint spaces are preserved. There is diffuse distal joint space
loss associated with subchondral sclerosis and osteophytosis. No
periarticular erosions are identified. No acute fracture or
dislocation.
IMPRESSION: 1. Left hand osteoarthritis.
2. Left wrist chondrocalcinosis which can be seen in the setting of
calcium pyrophosphate deposition disease.

## 2017-05-29 IMAGING — DX DG HAND COMPLETE 3+V*R*
3 series · 4 of 4 positions shown · non-contrast
Comparison: None.

CLINICAL DATA: Bilateral hand pain for many years, history of
arthritis

EXAM:
RIGHT HAND - COMPLETE 3+ VIEW

[hand pa]
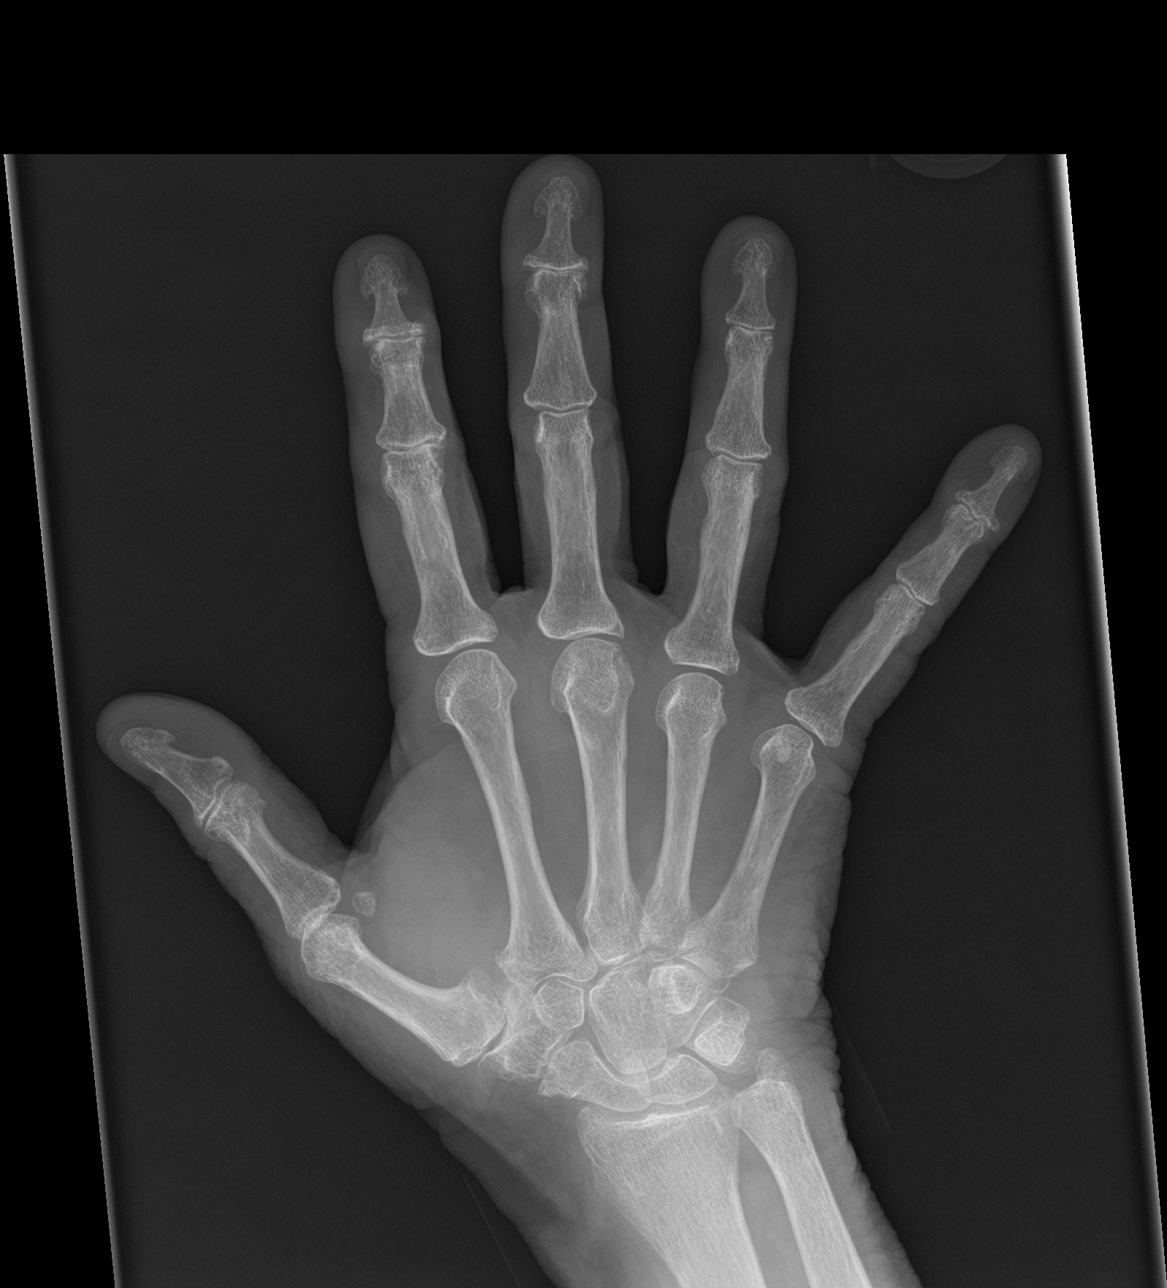

[Series 2: hand obl · 0.14mm/px · 2 of 2 slices shown]
[im 1/2]
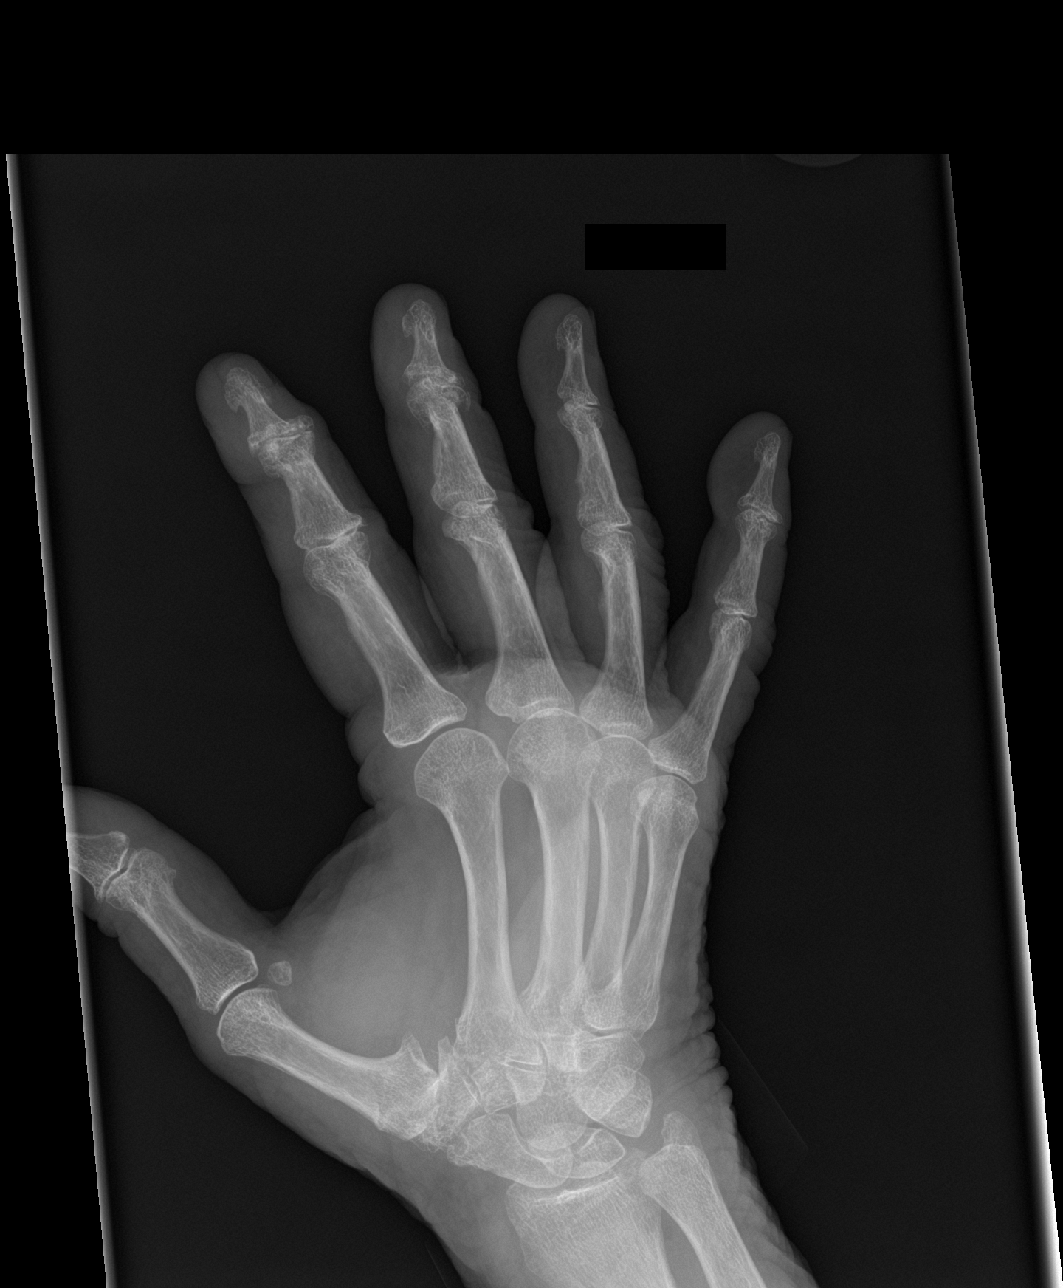
[im 2/2]
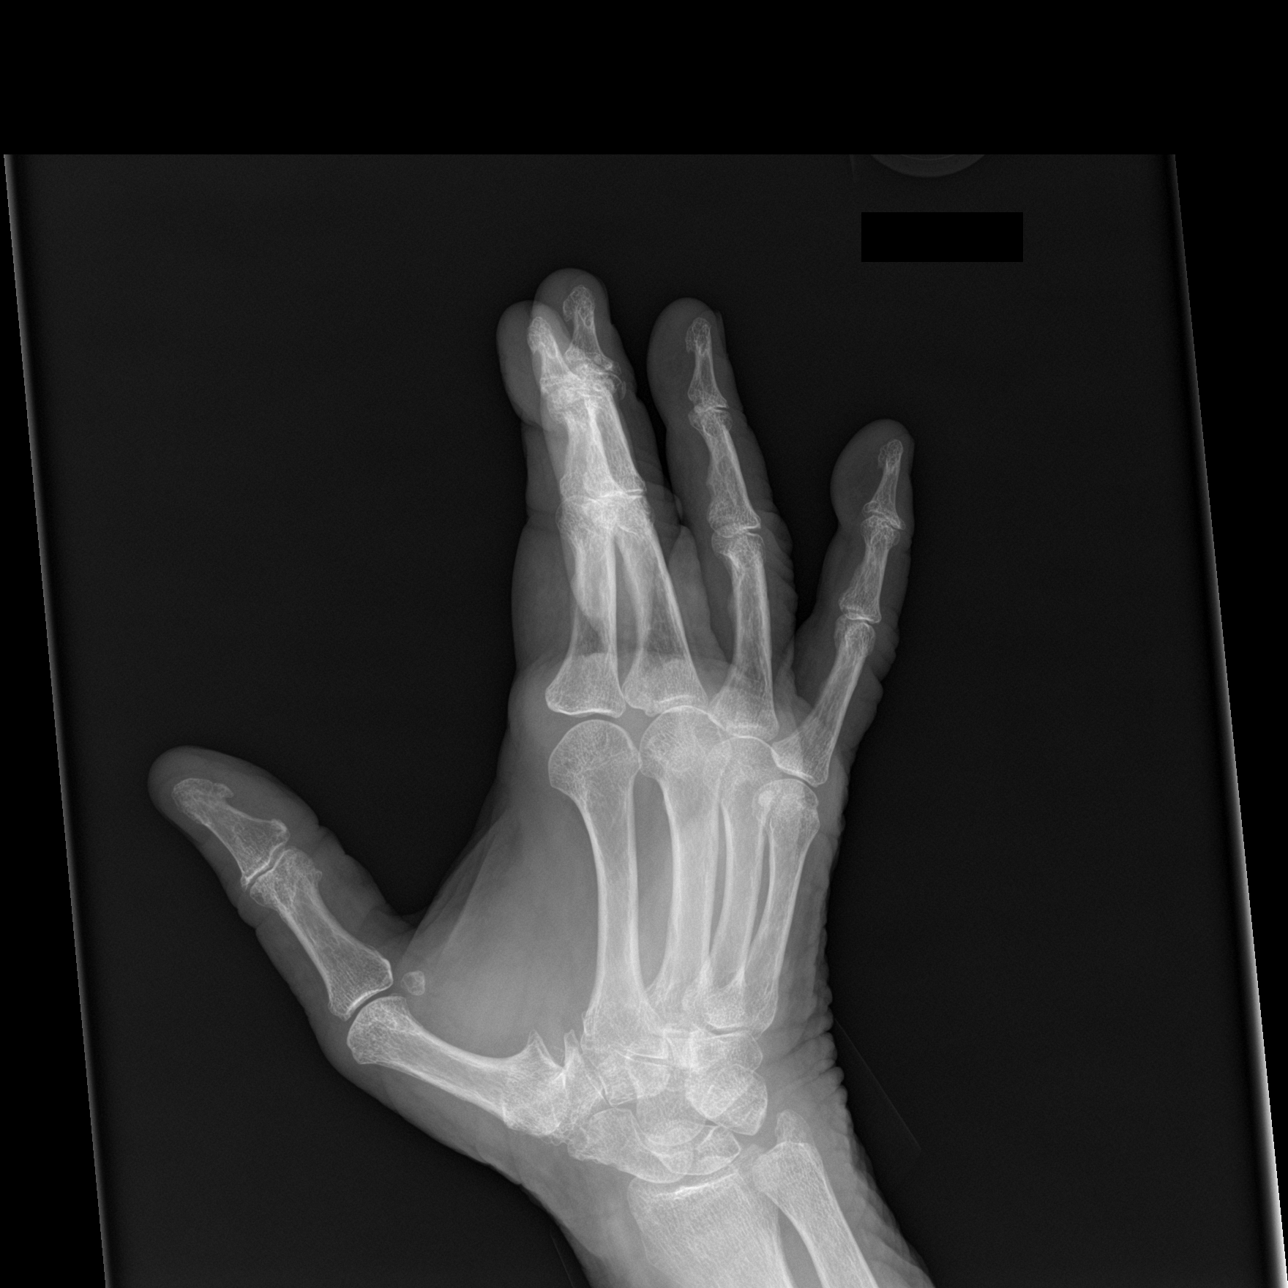

[hand lat]
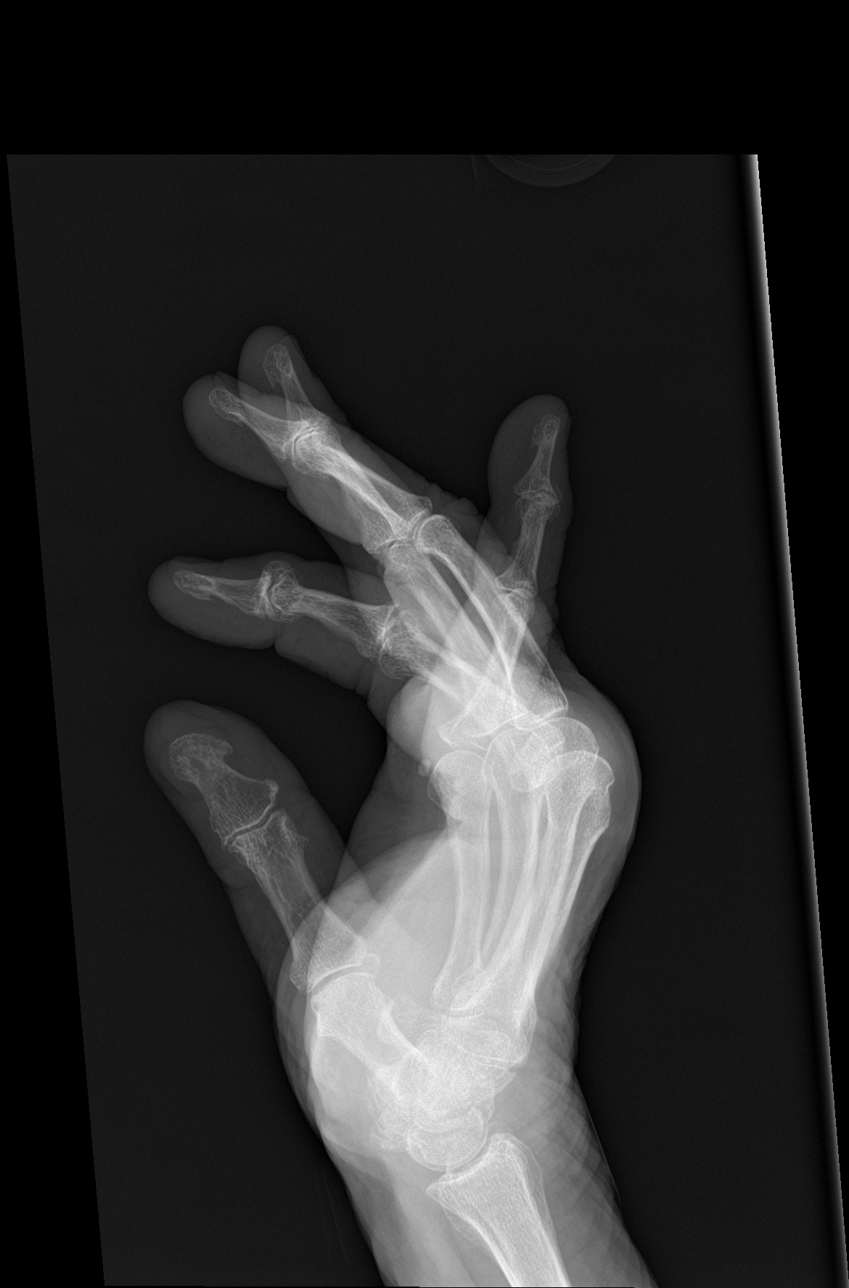

[4 of 4 positions shown; findings below may reference images not displayed]

FINDINGS: The bones of the right hand are adequately mineralized. There is
mild narrowing of the interphalangeal joints especially of the DIP
joints. There are mild proliferative changes of the bases of the
distal phalanges of the index and third fingers. The PIP joints
exhibit minimal narrowing. The MCP and CMC joints are preserved with
expected exception of the first CMC joint where there is moderate
osteoarthritic change. The intercarpal joints and the radiocarpal
joint are unremarkable. The soft tissues of the hand are
unremarkable.
IMPRESSION: There are moderate osteoarthritic changes centered on the DIP joints
and the first CMC joint. There are milder osteoarthritic changes
elsewhere.

## 2017-05-29 IMAGING — DX DG LUMBAR SPINE COMPLETE 4+V
5 series · 5 of 5 positions shown · non-contrast
Comparison: Radiographs dated 03/02/2013

CLINICAL DATA: Low back pain and left sciatica.

EXAM:
LUMBAR SPINE - COMPLETE 4+ VIEW

[l-spine ap]
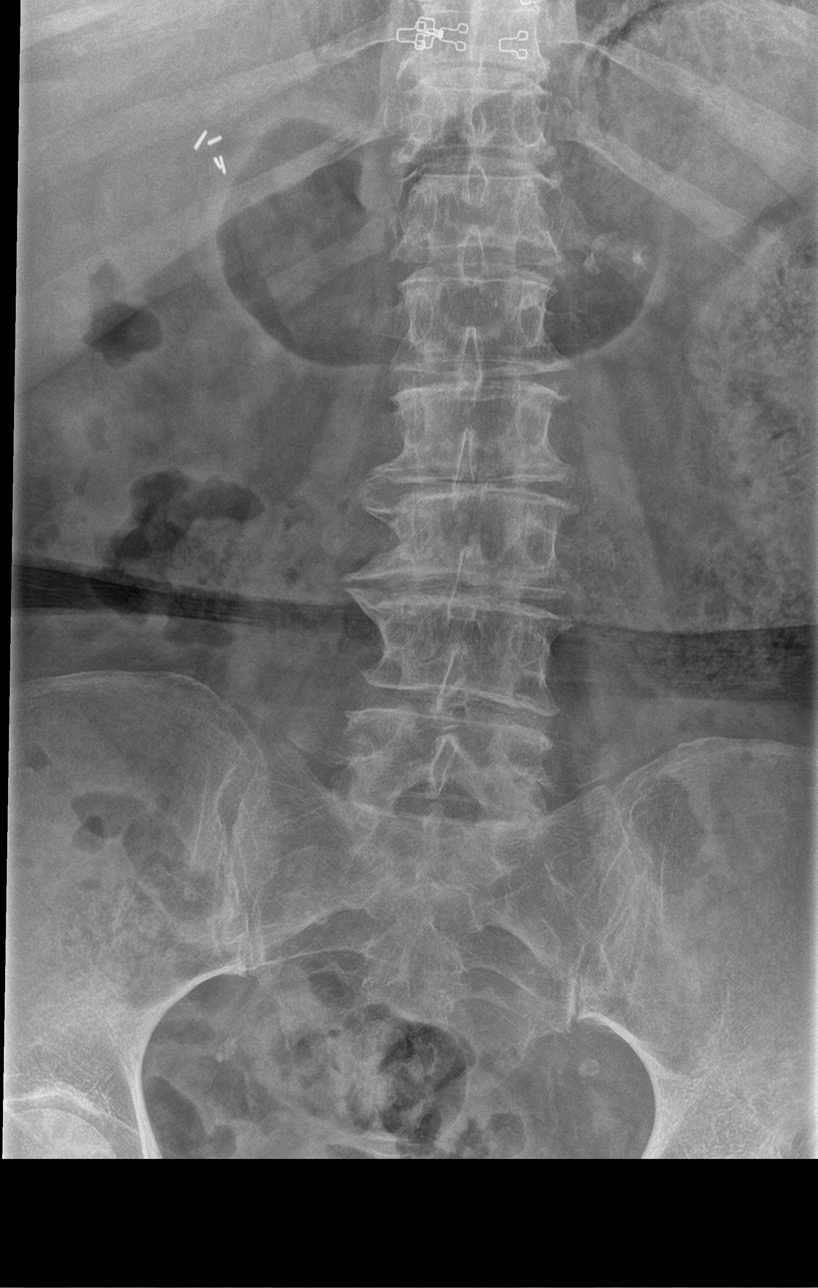

[l-spine obl (1 of 2)]
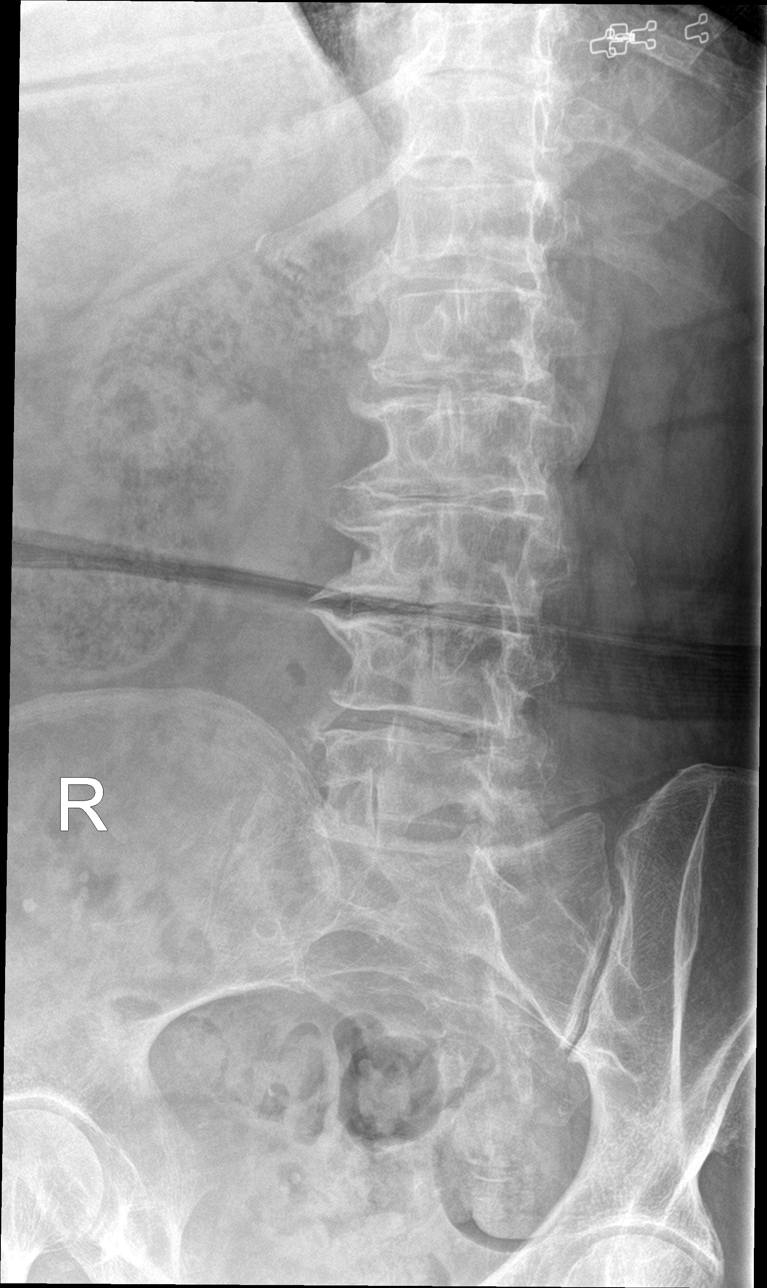

[l-spine obl (2 of 2)]
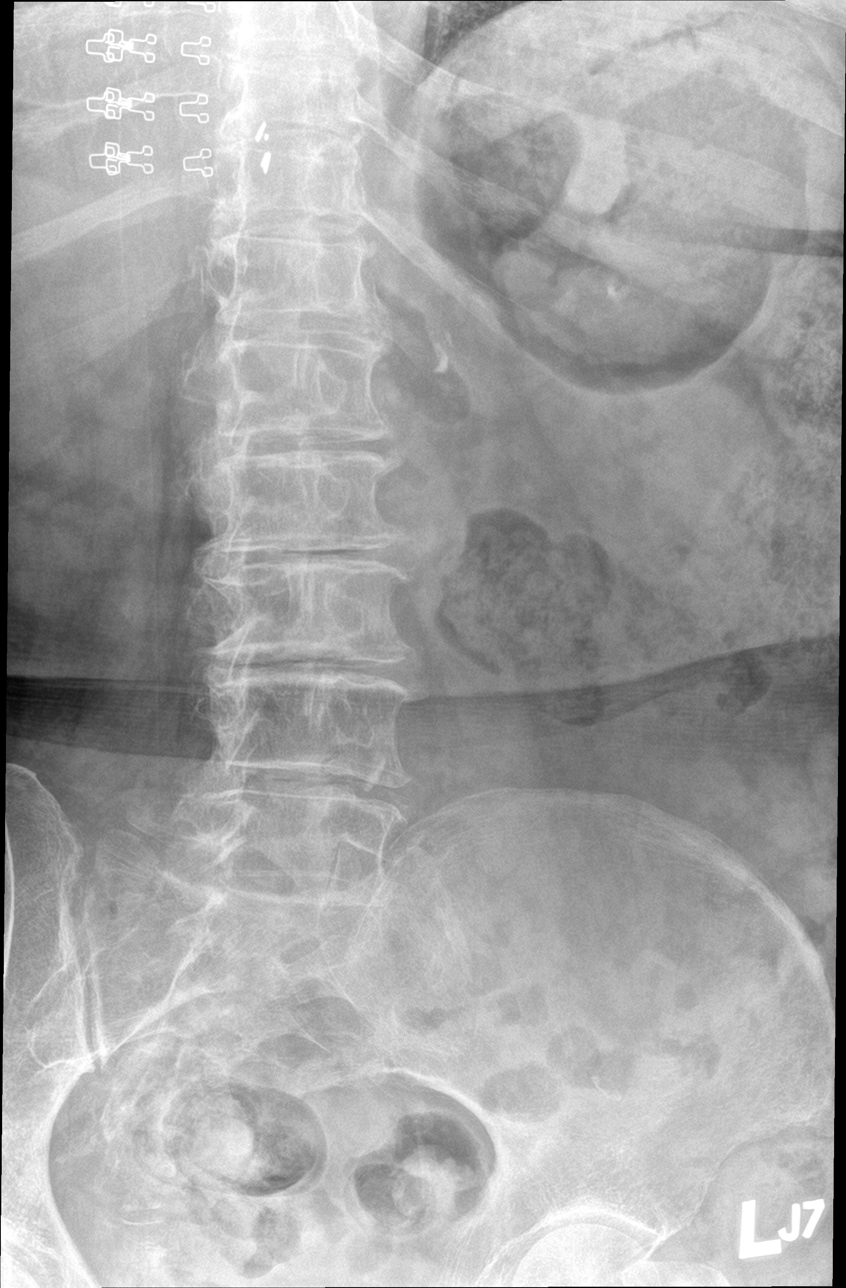

[l-spine lat]
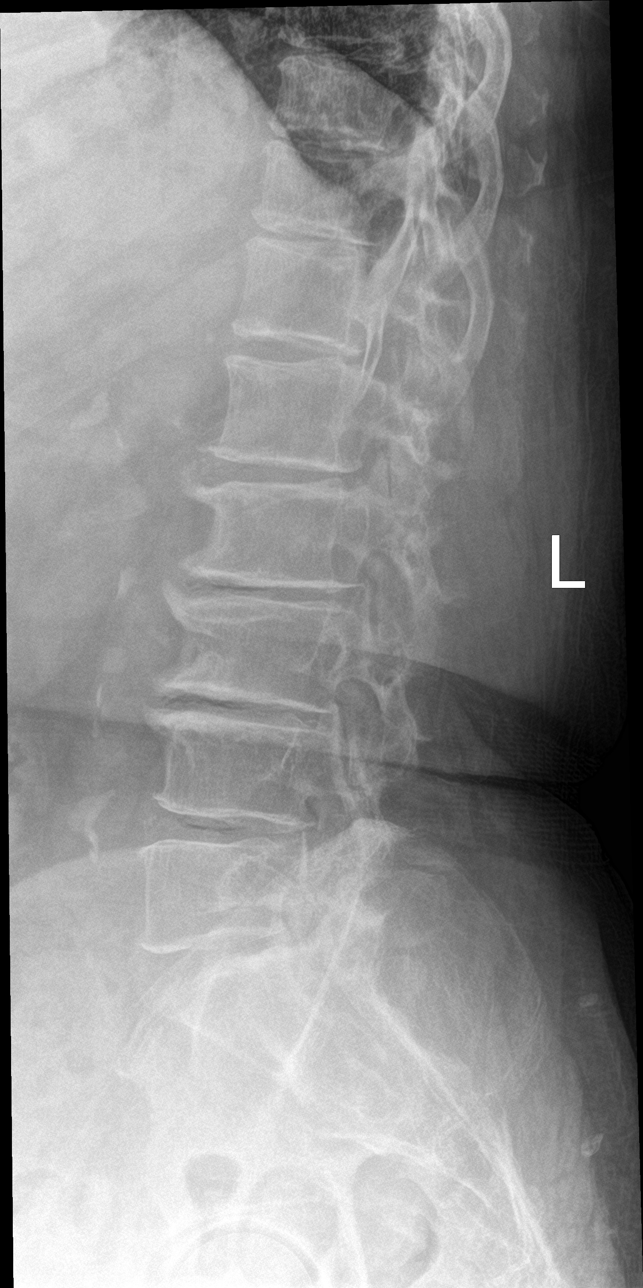

[l-spine spot]
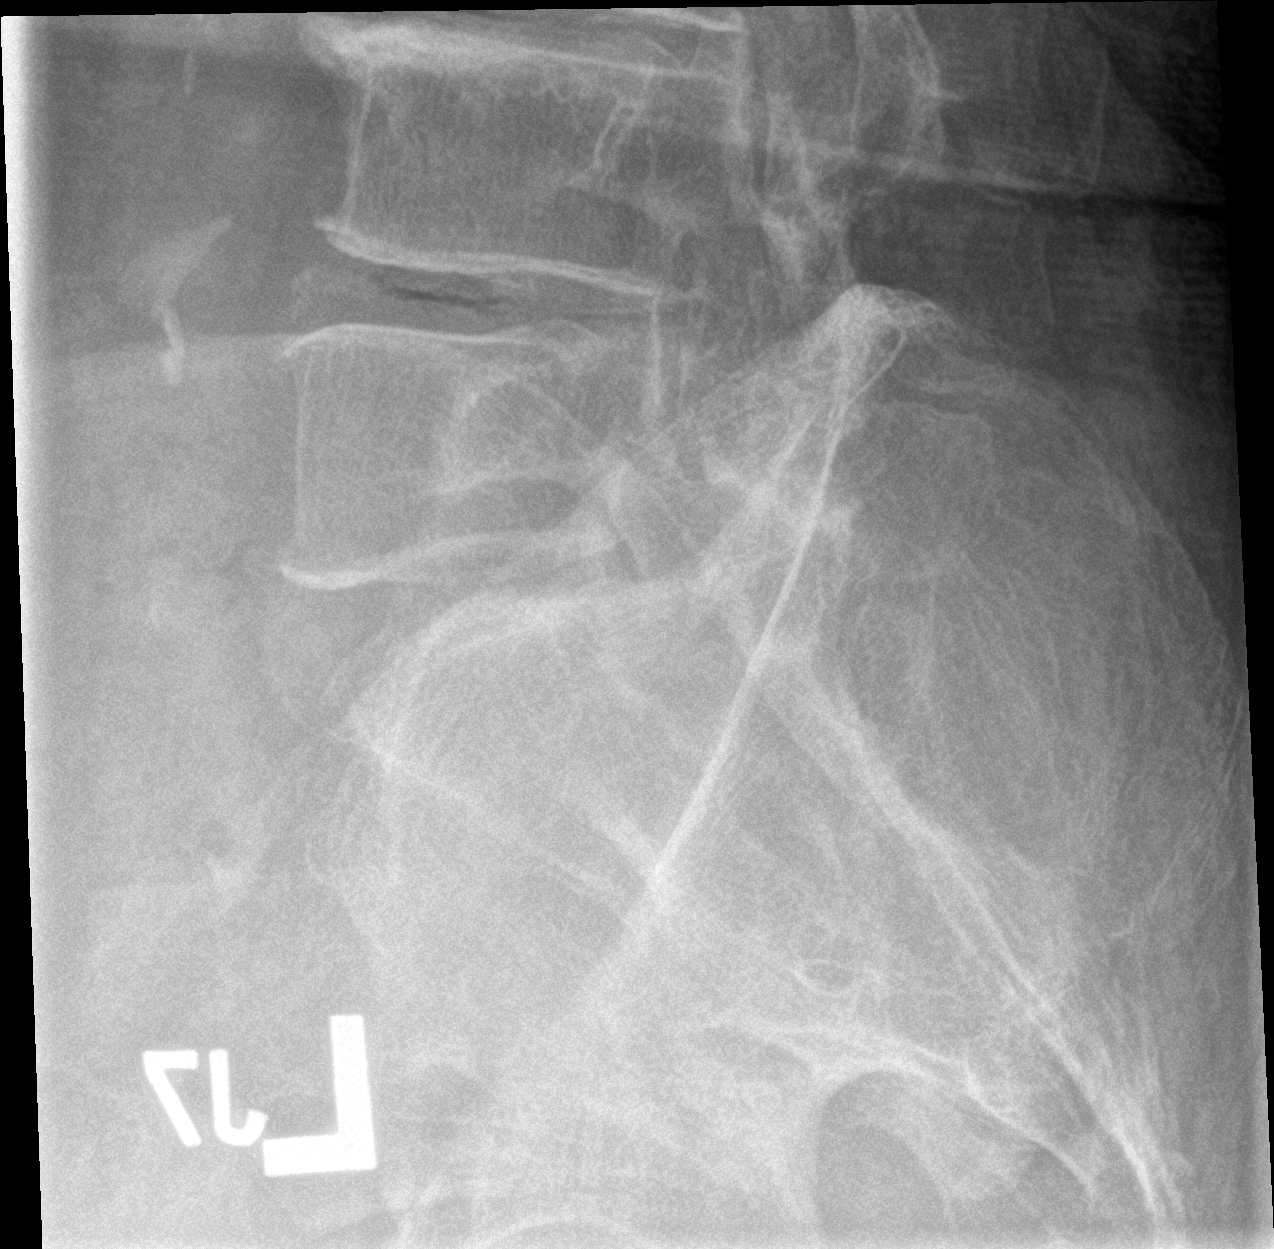

[5 of 5 positions shown; findings below may reference images not displayed]

FINDINGS: There is progressive degenerative disc disease from L1-2 through
L4-5 with vacuum phenomena at L2-3, L3-4, and L4-5. No subluxation.
No pars defects. No significant facet arthritis.
IMPRESSION: Progressive degenerative disc disease in the lower lumbar spine.

## 2017-07-01 IMAGING — MR MR HEAD W/O CM
8 of 12 series · 25 of 48 positions shown · non-contrast
Comparison: 06/26/2015 head CT.  07/16/2012 brain MR.

CLINICAL DATA: 78-year-old female with dizziness and slurred
speech. History of prior stroke. Subsequent encounter.

EXAM:
MRI HEAD WITHOUT CONTRAST
TECHNIQUE: Multiplanar, multiecho pulse sequences of the brain and surrounding
structures were obtained without intravenous contrast.

[Series 5: T1 · sagittal · 5.0mm · 0.41mm/px · 3 of 21 slices shown (1 of 2)]
[im 1/21]
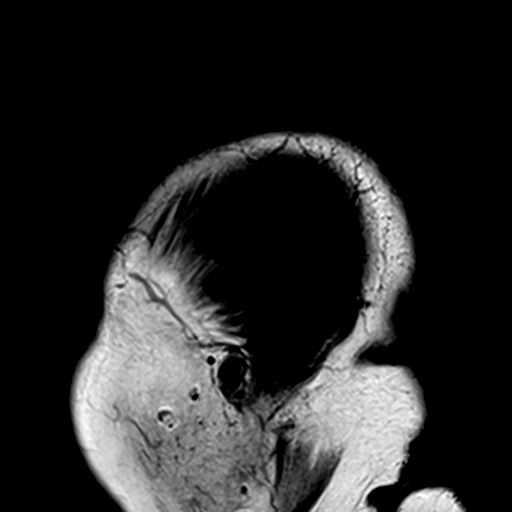
[im 11/21]
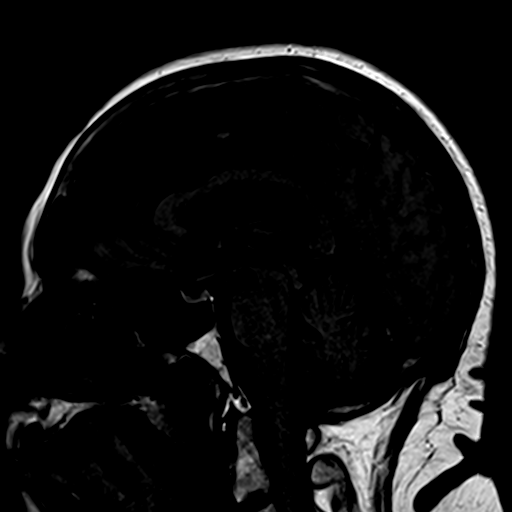
[im 21/21]
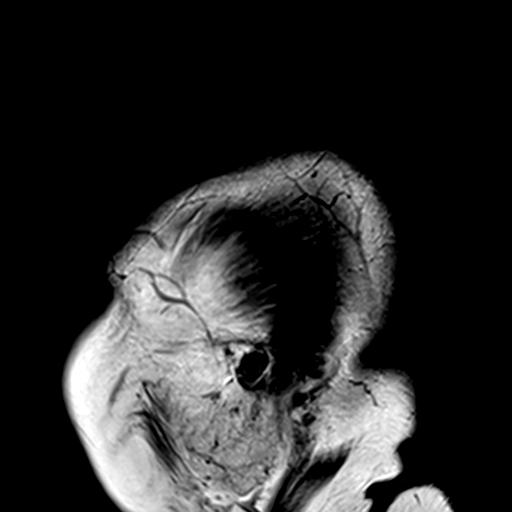

[Series 6: T2 · axial · 5.0mm · 0.48mm/px · z∈[-37,+104]mm · 3 of 23 slices shown (1 of 4)]
[im 1/23]
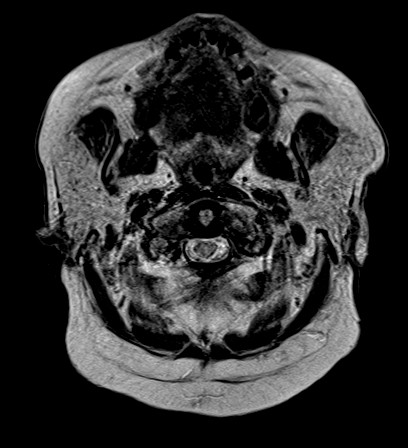
[im 12/23]
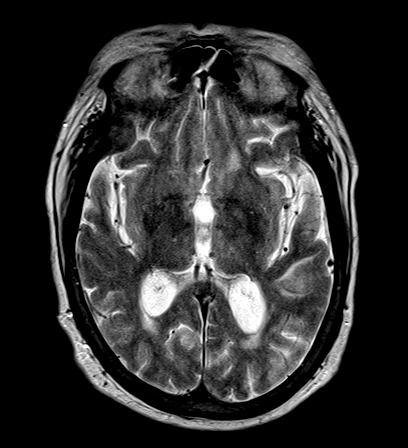
[im 23/23]
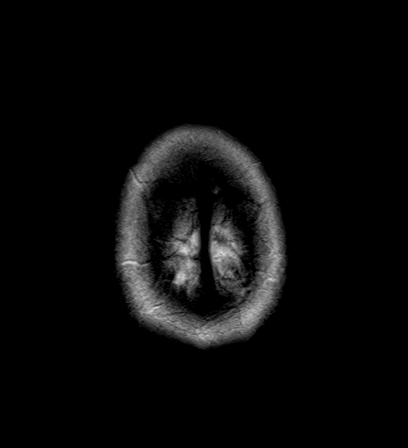

[Series 7: FLAIR · axial · 5.0mm · 0.35mm/px · z∈[-37,+104]mm · 2 of 23 slices shown]
[im 1/23]
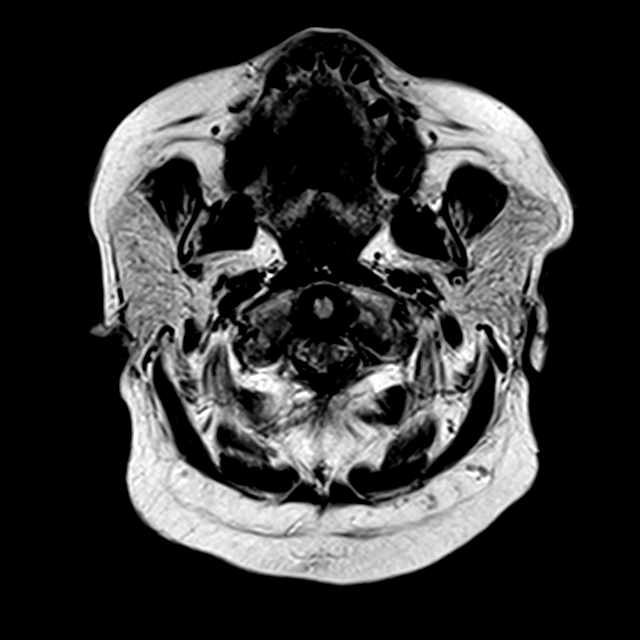
[im 23/23]
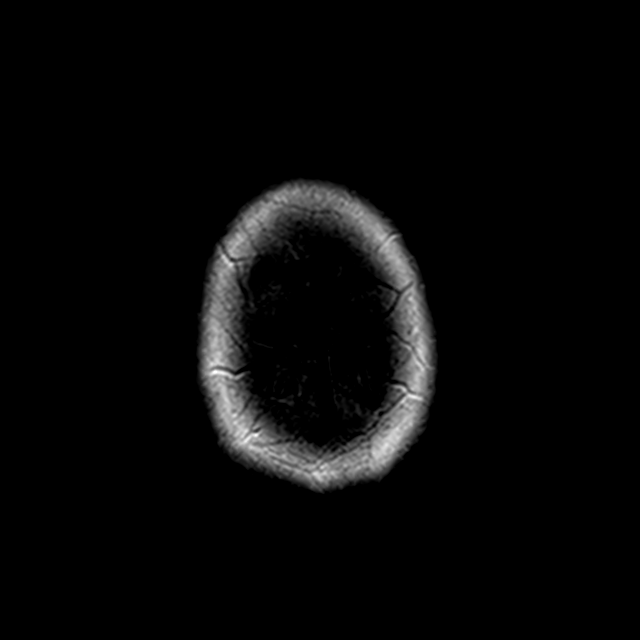

[Series 8: T1 · axial · 2.0mm · 0.43mm/px · z∈[-43,+103]mm · 8 of 75 slices shown (2 of 2)]
[im 1/75]
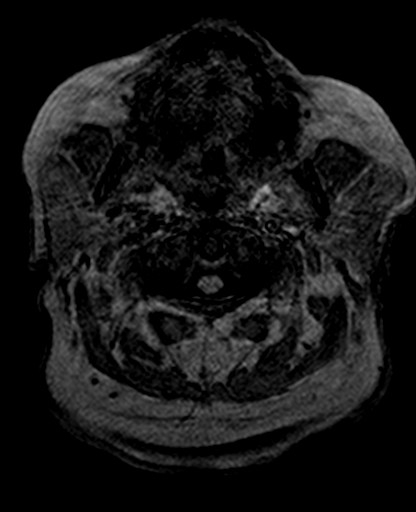
[im 11/75]
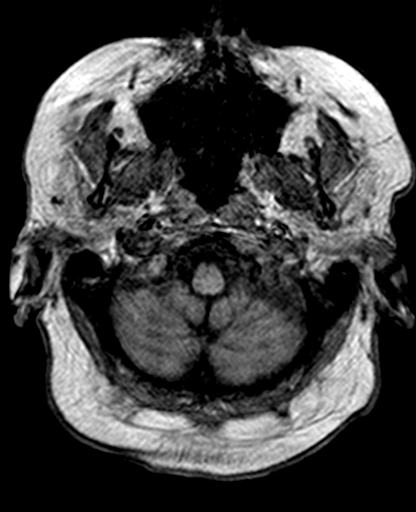
[im 22/75]
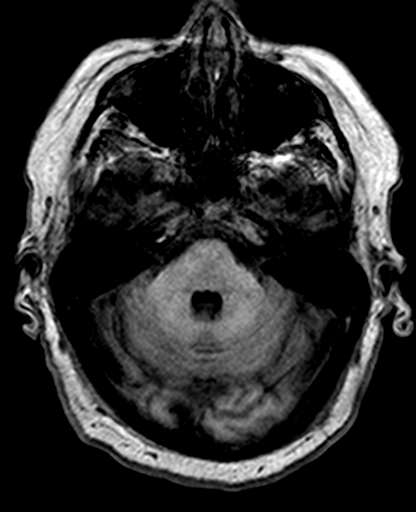
[im 32/75]
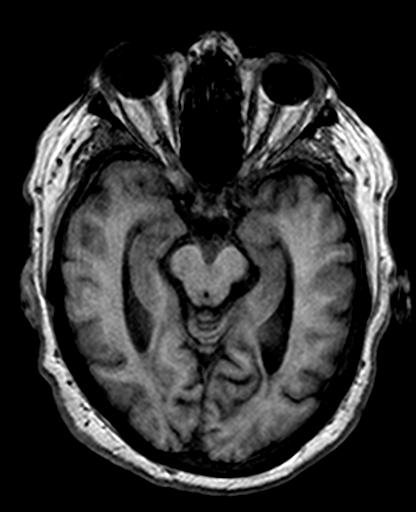
[im 43/75]
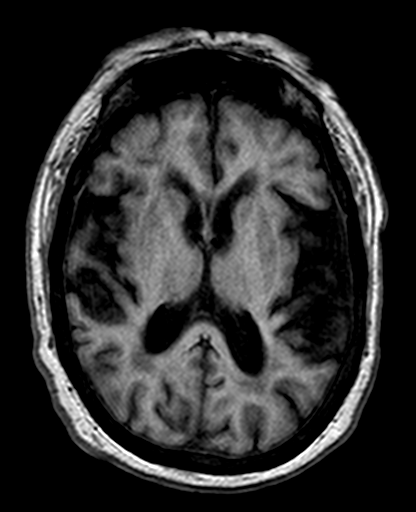
[im 53/75]
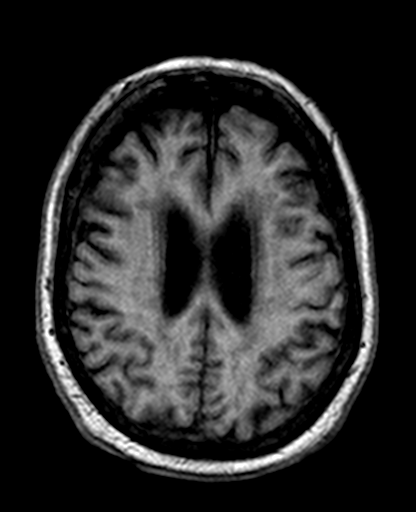
[im 64/75]
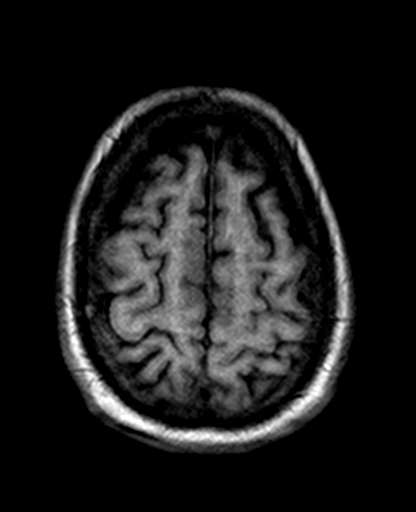
[im 75/75]
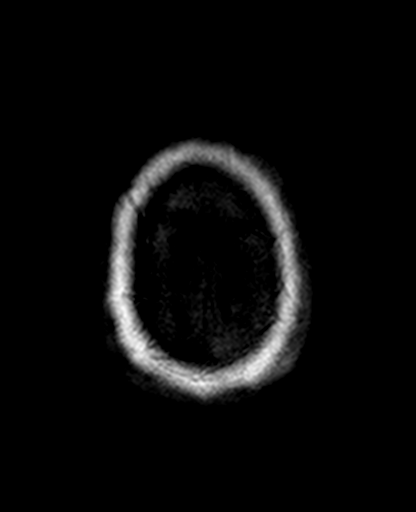

[Series 9: trauma axial · axial · 5.0mm · 0.42mm/px · 1 of 23 slices shown]
[im 1/23]
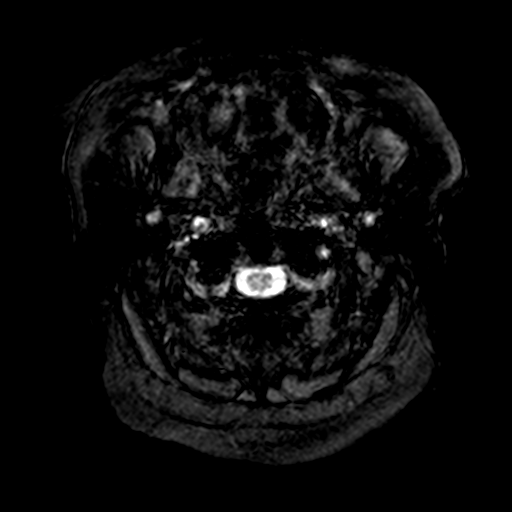

[Series 10: T2 · coronal · 5.0mm · 0.43mm/px · 3 of 28 slices shown (2 of 4)]
[im 1/28]
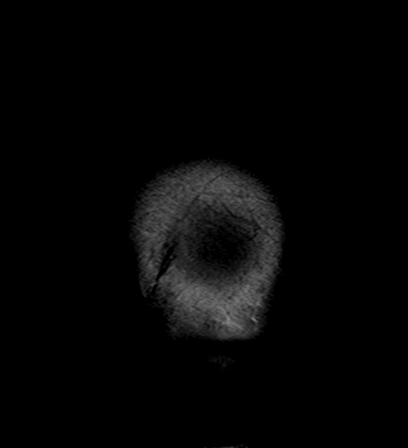
[im 14/28]
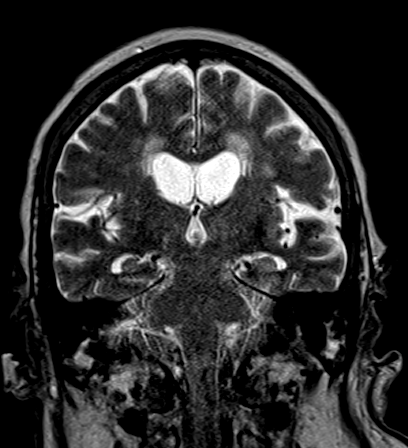
[im 28/28]
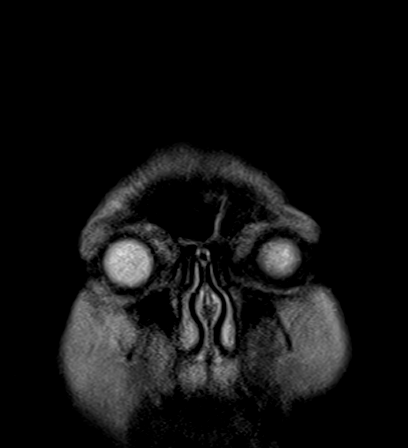

[Series 11: T2 · axial · 5.0mm · 0.67mm/px · z∈[-38,+103]mm · 2 of 23 slices shown (3 of 4)]
[im 1/23]
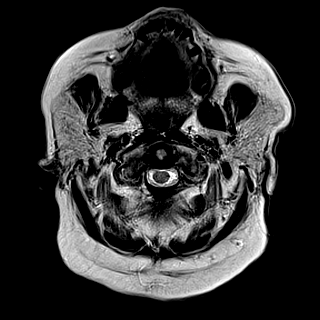
[im 23/23]
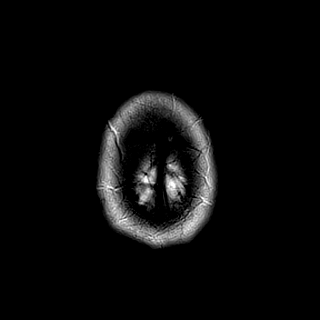

[Series 12: T2 · coronal · 5.0mm · 0.61mm/px · 3 of 28 slices shown (4 of 4)]
[im 1/28]
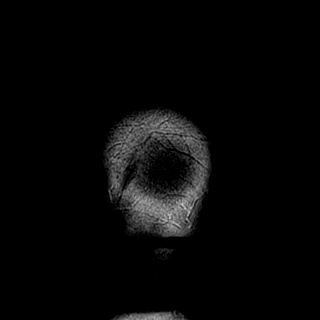
[im 14/28]
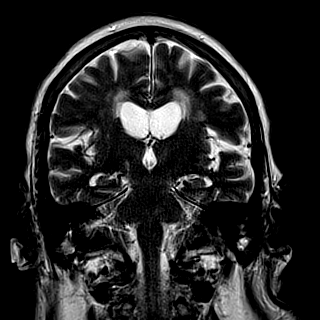
[im 28/28]
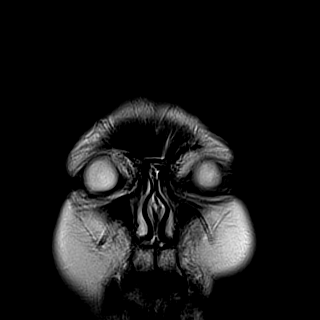

[25 of 48 positions shown; findings below may reference images not displayed]

FINDINGS: Exam is motion degraded.

No acute infarct.

Prominent small vessel disease type changes.

No intracranial hemorrhage.

No intracranial mass lesion noted on this unenhanced exam.

Global atrophy without hydrocephalus.

Major intracranial vascular structures are patent.

Exophthalmos.  Post lens replacement.

Cervical medullary junction and pineal region unremarkable.

Partially empty sella.

Partial opacification inferior aspect mastoid air cells without
obstructing lesion of the eustachian tube noted. Mild mucosal
thickening frontal sinuses and ethmoid sinus air cells.
IMPRESSION: Exam is motion degraded.

No acute infarct.

Prominent small vessel disease type changes.

Global atrophy.

Partial opacification inferior aspect mastoid air cells. Mild
mucosal thickening frontal sinuses and ethmoid sinus air cells.

## 2017-07-08 IMAGING — DX DG SACRUM/COCCYX 2+V
3 series · 3 of 3 positions shown · non-contrast
Comparison: None.

CLINICAL DATA: Status post fall on her back.

EXAM:
SACRUM AND COCCYX - 2+ VIEW

[sacrum ap]
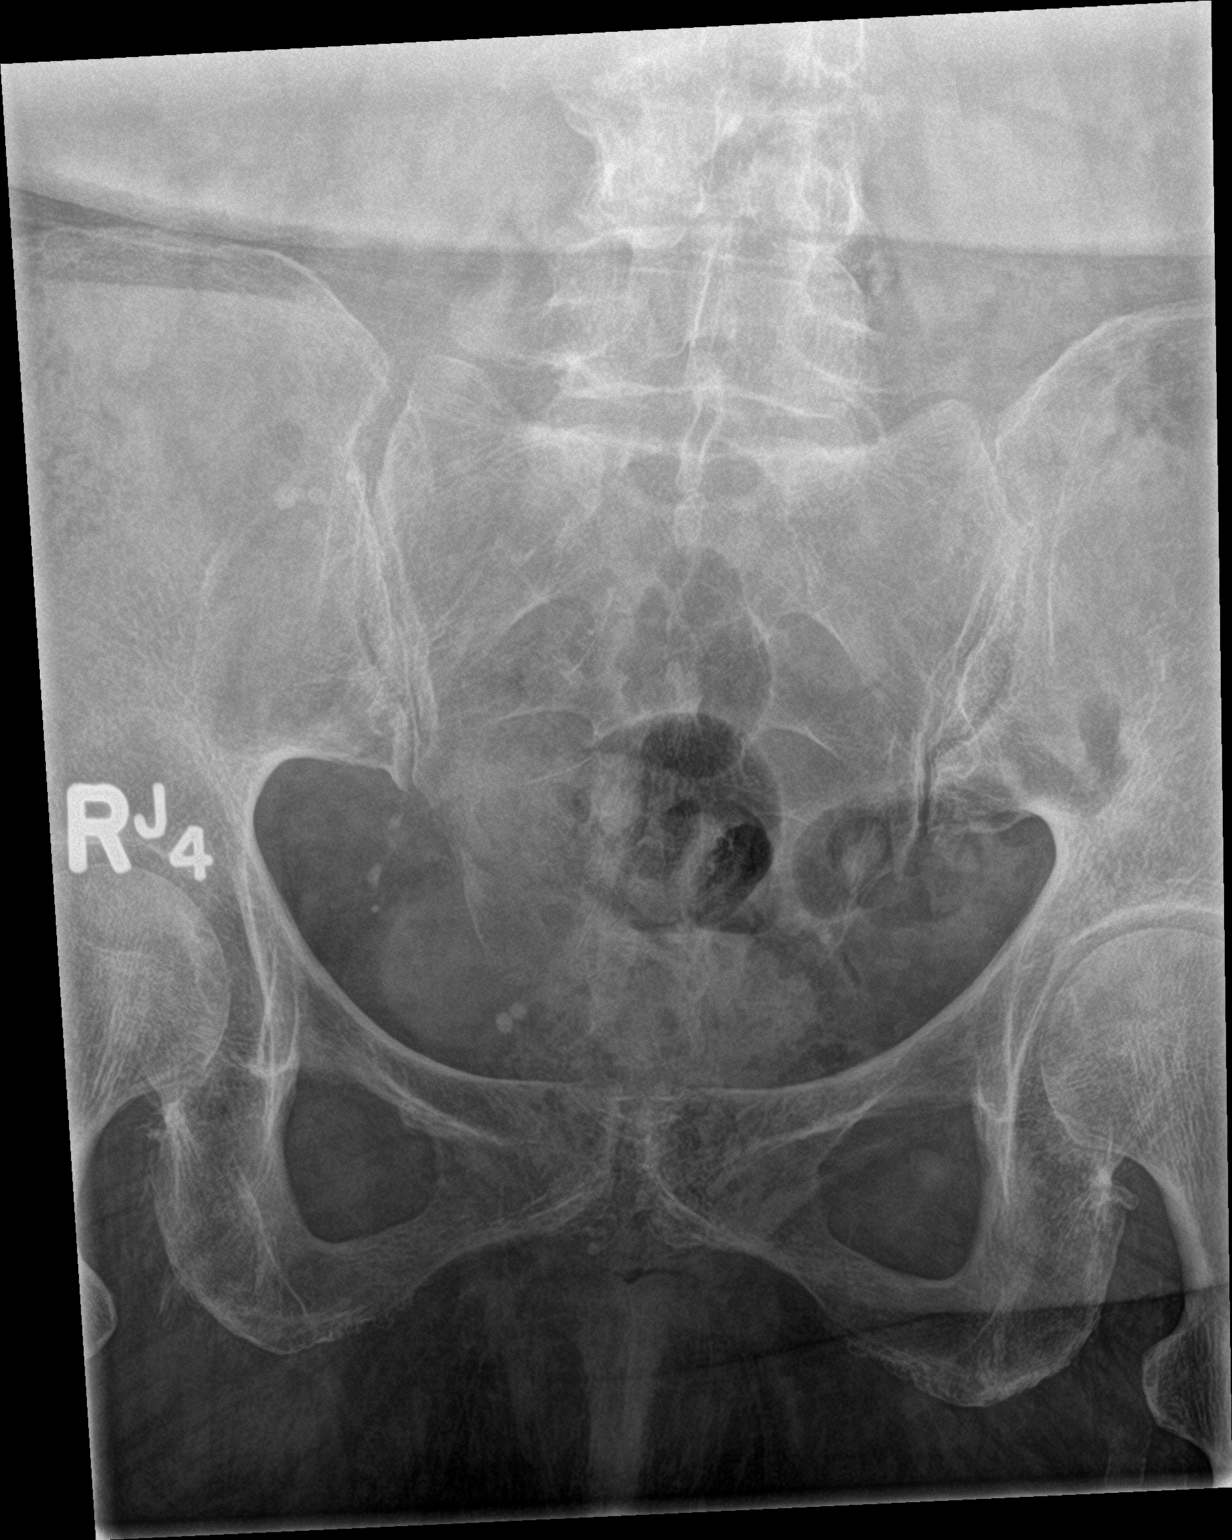

[coccyx ap]
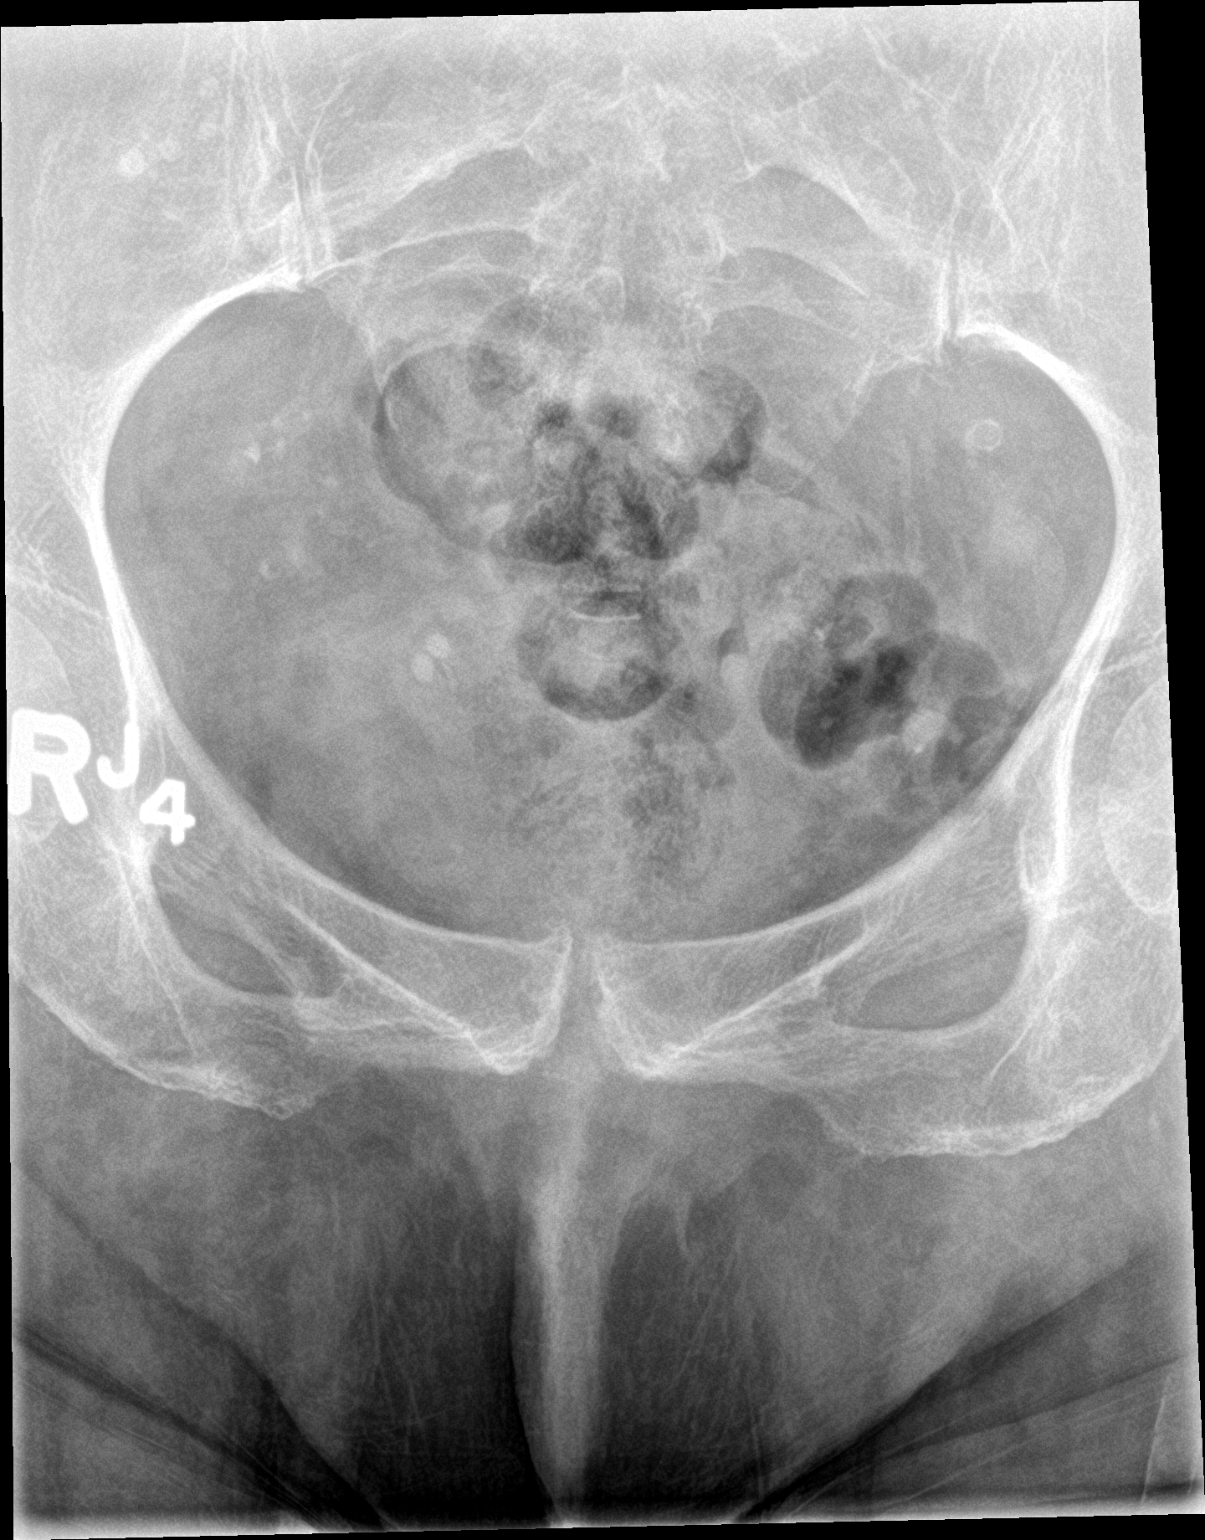

[coccyx lat]
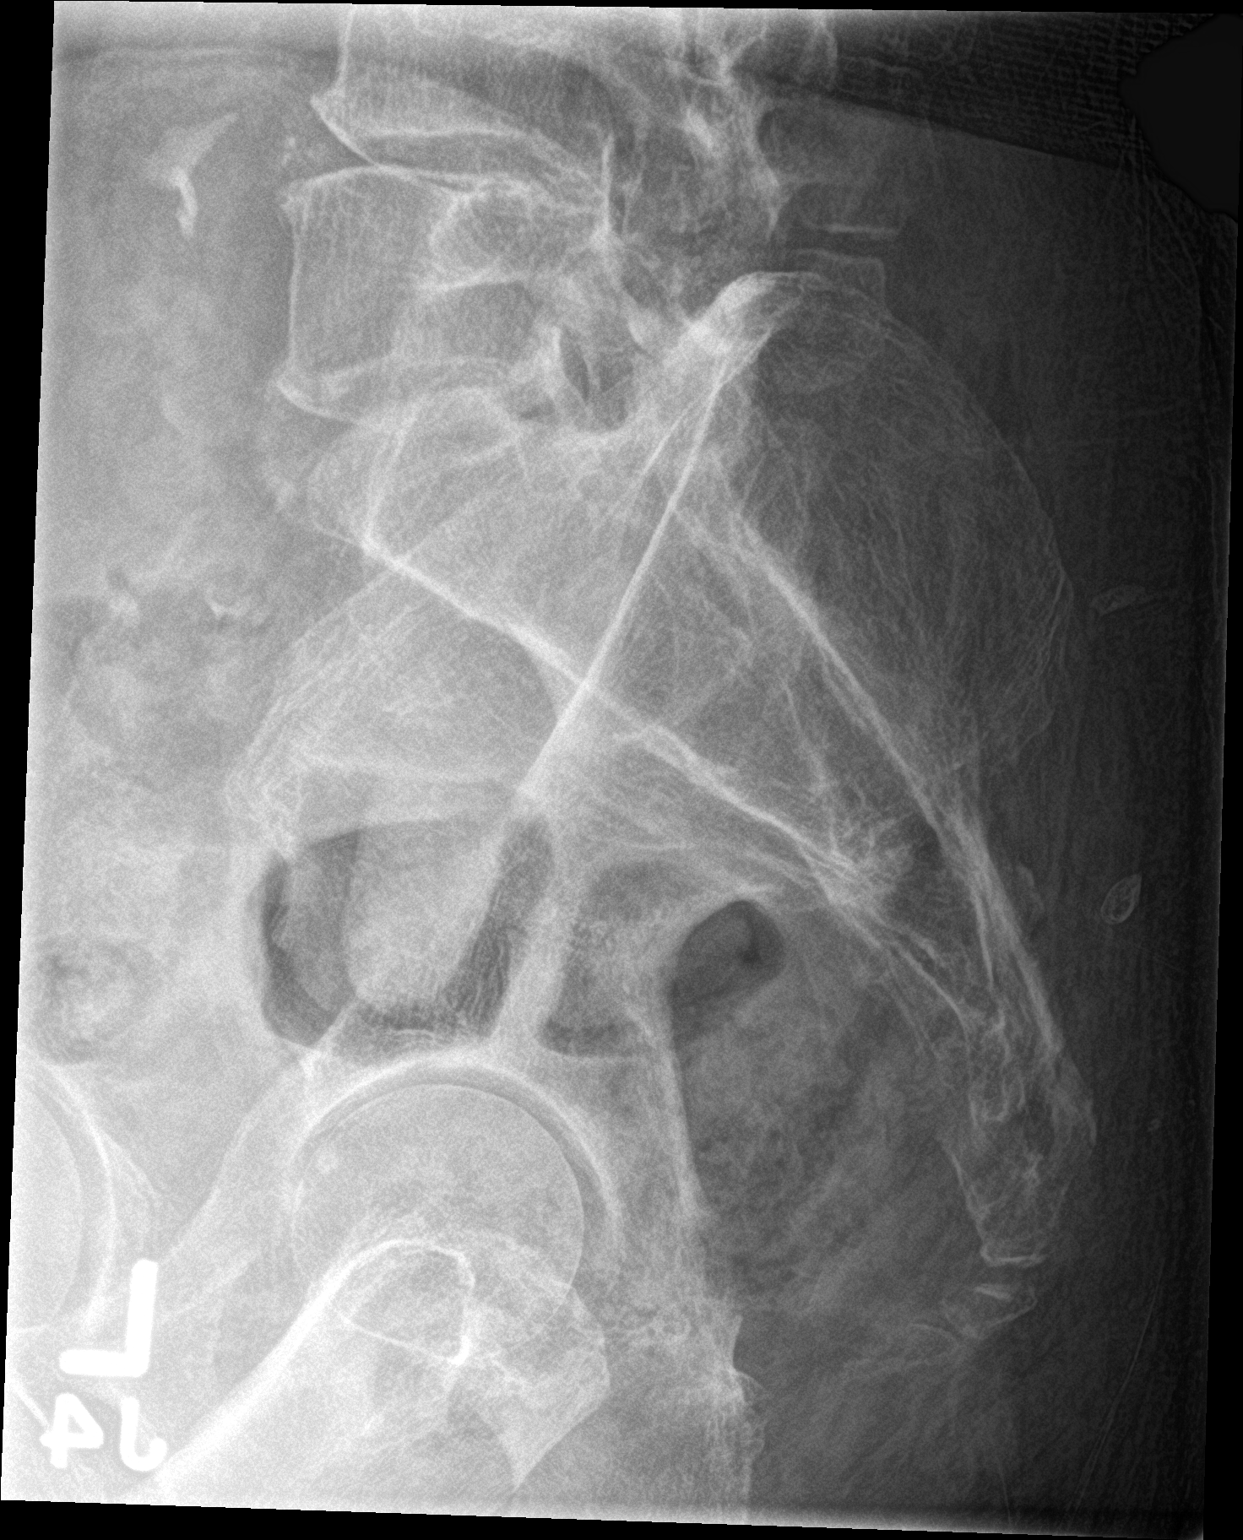

[3 of 3 positions shown; findings below may reference images not displayed]

FINDINGS: There is no evidence of fracture or dislocation. Degenerative joint
changes of lumbar spine are identified but
IMPRESSION: No acute fracture or dislocation.

## 2017-07-08 IMAGING — DX DG LUMBAR SPINE COMPLETE 4+V
5 series · 5 of 5 positions shown · non-contrast
Comparison: 05/25/2015.

CLINICAL DATA: Fall.  Initial evaluation.

EXAM:
LUMBAR SPINE - COMPLETE 4+ VIEW

[l-spine ap]
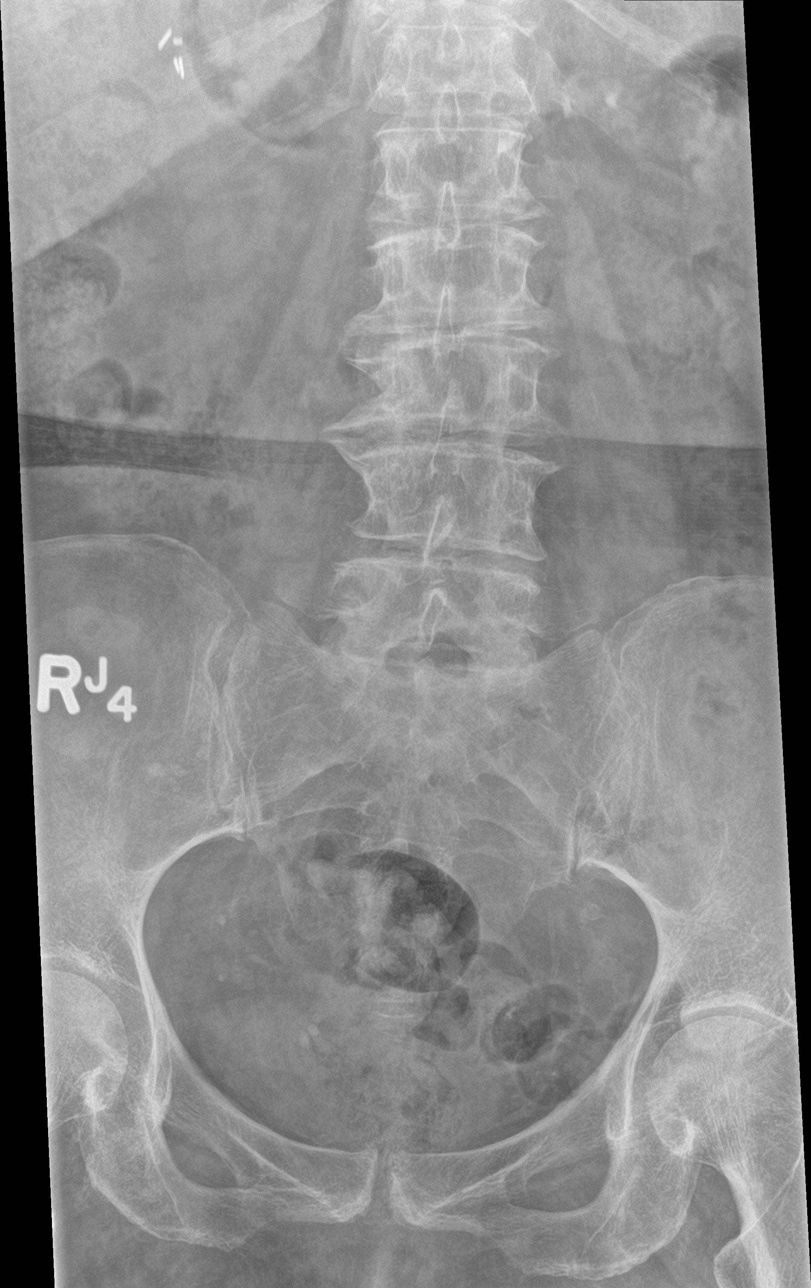

[l-spine obl (1 of 2)]
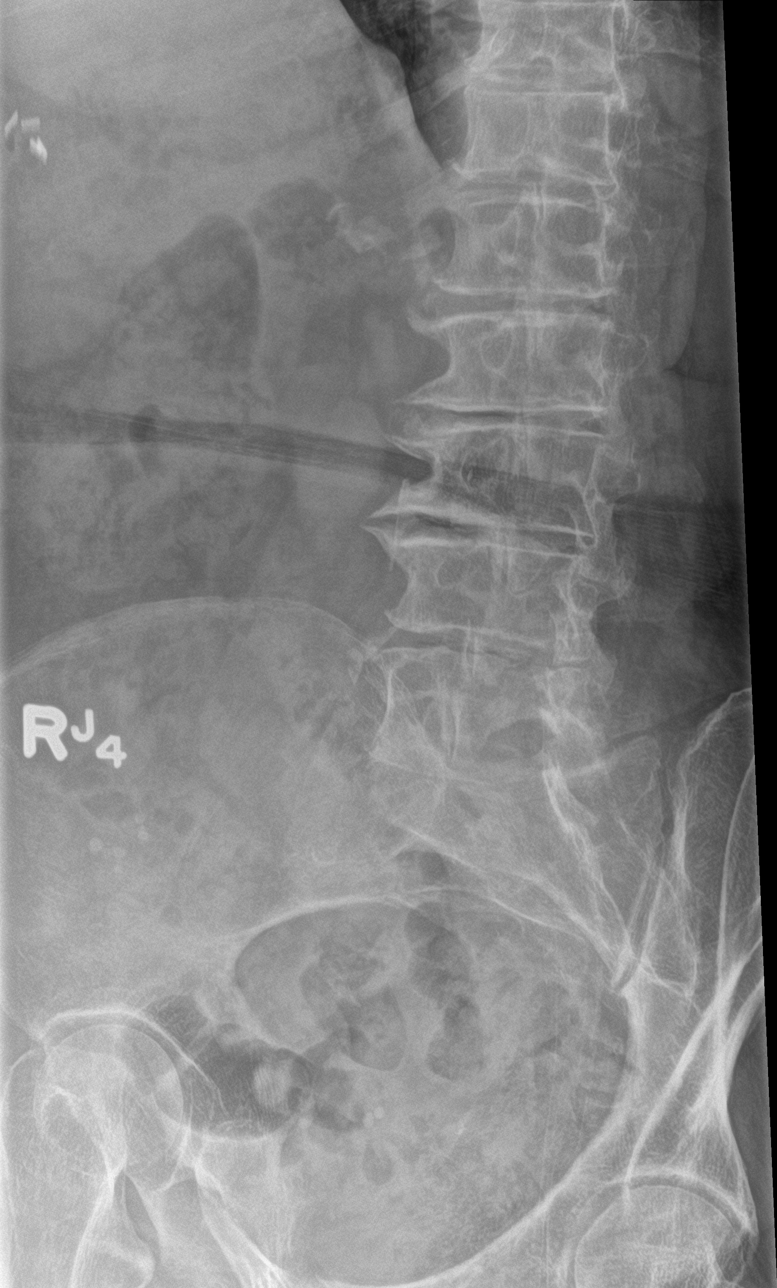

[l-spine obl (2 of 2)]
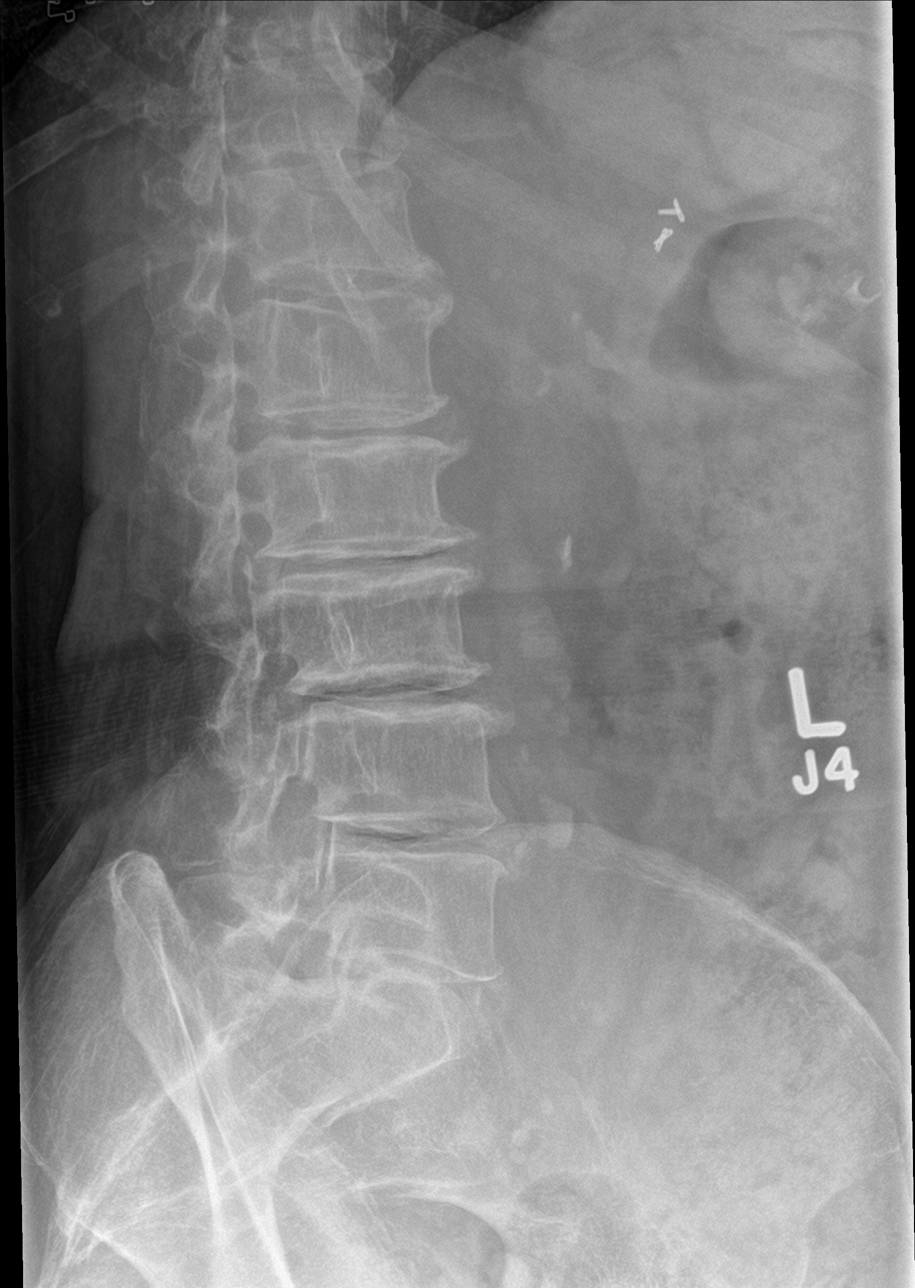

[l-spine lat]
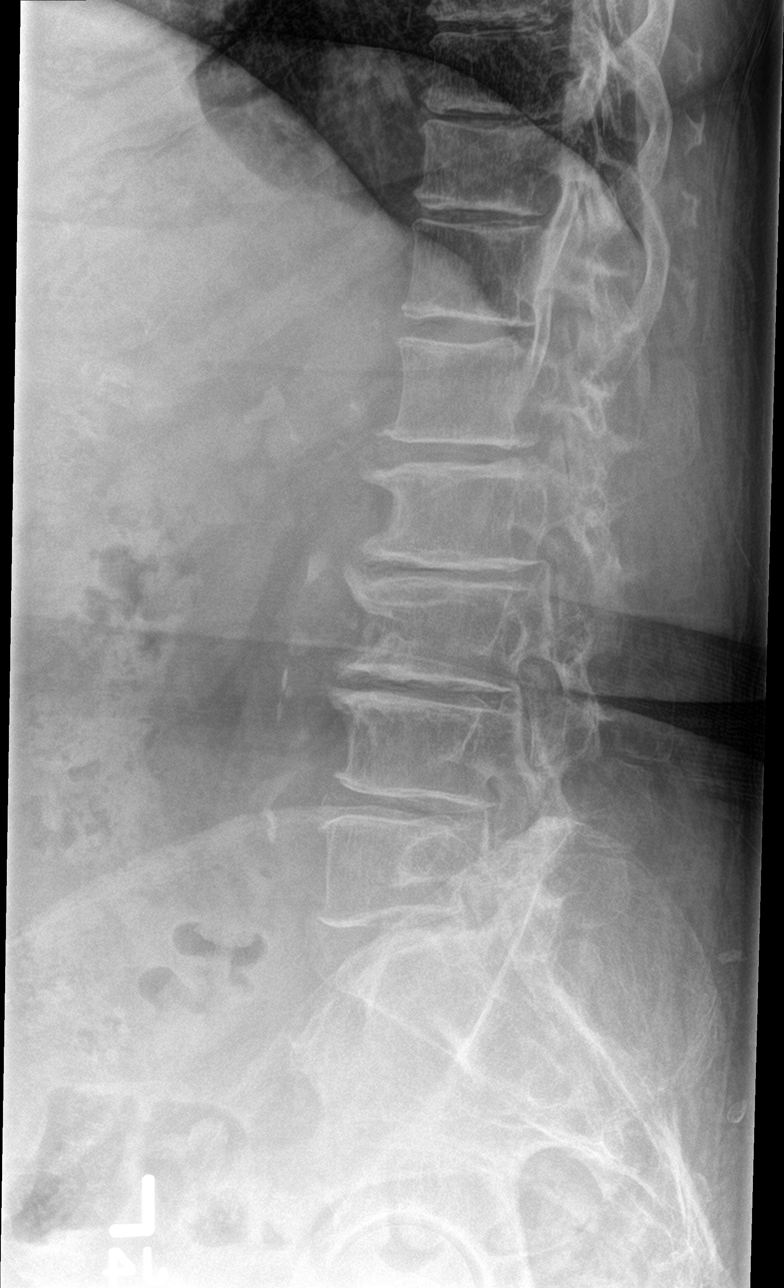

[l-spine spot]
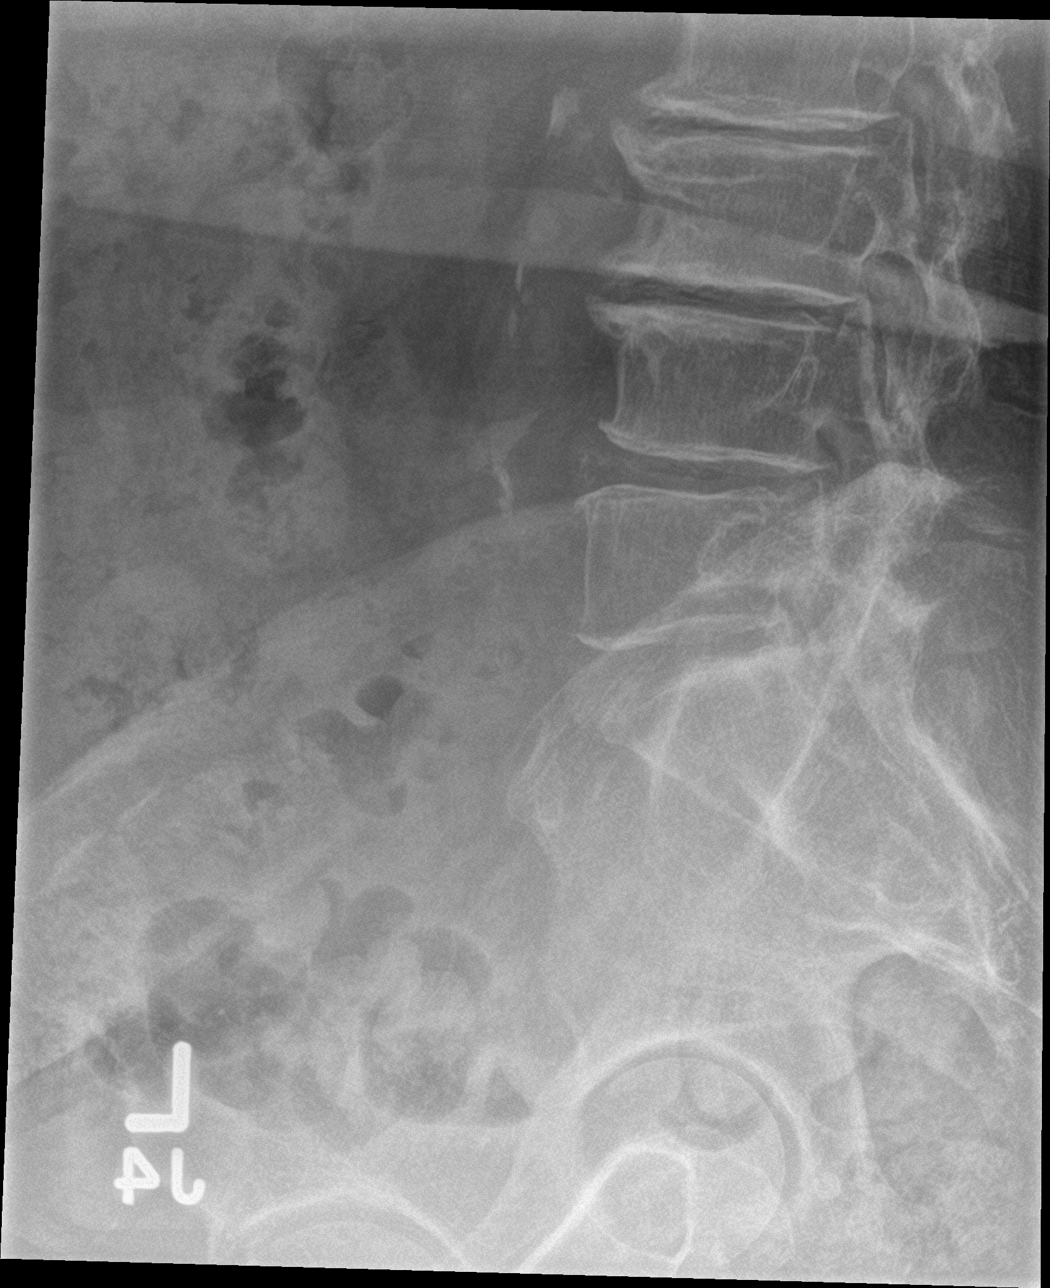

[5 of 5 positions shown; findings below may reference images not displayed]

FINDINGS: Paraspinal soft tissues are unremarkable. Prior cholecystectomy.
Pelvic phleboliths. Diffuse osteopenia . Multilevel degenerative
change present. No evidence of acute fracture or malalignment. Exam
stable from prior exam.
IMPRESSION: Diffuse osteopenia and degenerative change.  No acute abnormality.

## 2017-07-22 IMAGING — CT CT HEAD W/O CM
1 series · 16 of 30 positions shown, 20 images · non-contrast
Comparison: 06/26/2015

CLINICAL DATA: Episodes of imbalance, dizziness, recent fall, TIA
symptoms

EXAM:
CT HEAD WITHOUT CONTRAST
TECHNIQUE: Contiguous axial images were obtained from the base of the skull
through the vertex without contrast.

[Series 2: headseq 4.8 h37s · axial · 0.45mm/px · z∈[+122,+262]mm · 16 of 30 slices shown, 20 images]
[im 2/30  brain]
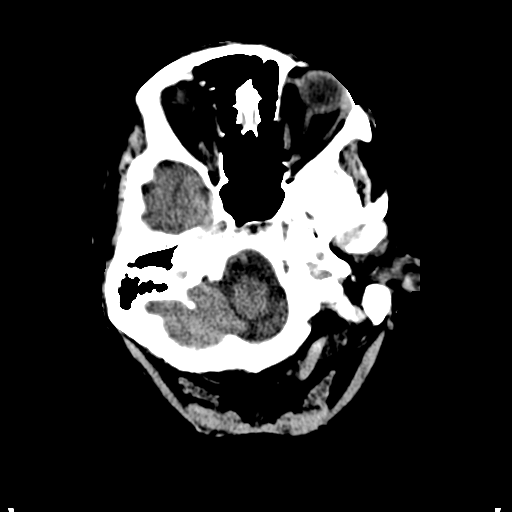
[im 2/30  bone]
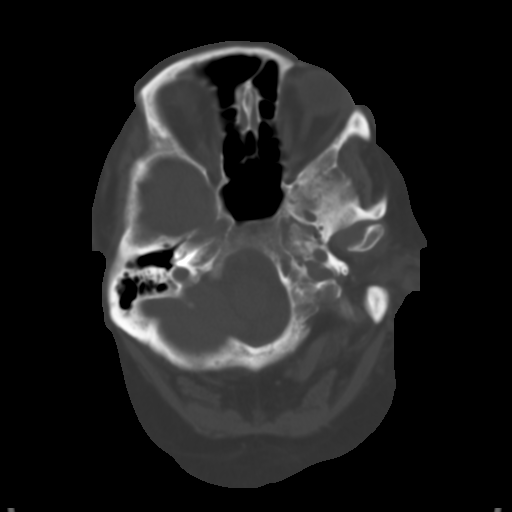
[im 4/30  brain]
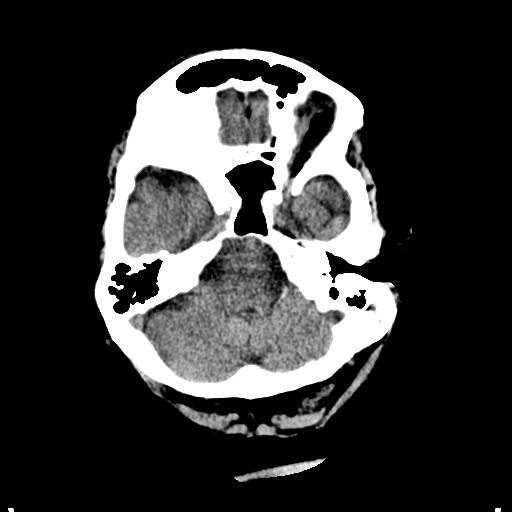
[im 6/30  brain]
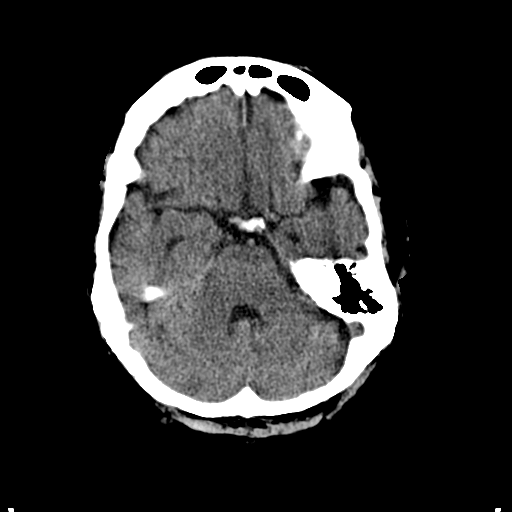
[im 8/30  brain]
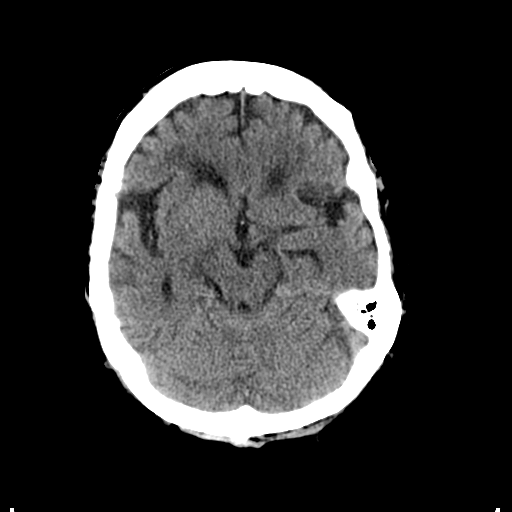
[im 9/30  brain]
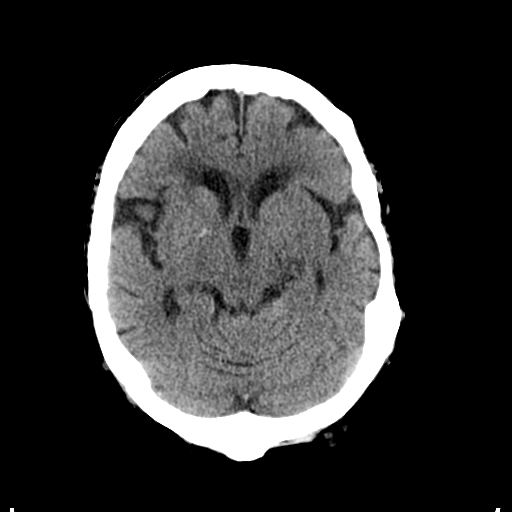
[im 9/30  bone]
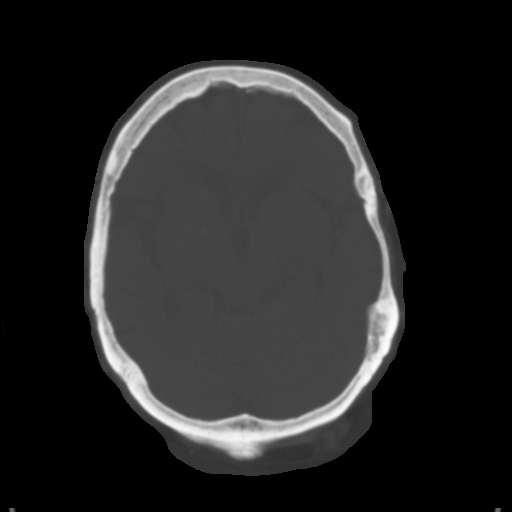
[im 11/30  brain]
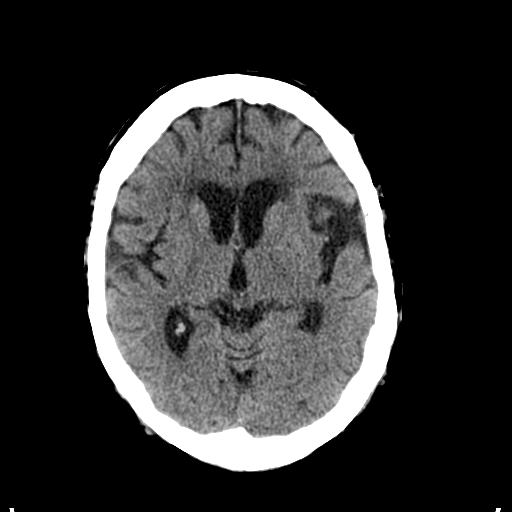
[im 13/30  brain]
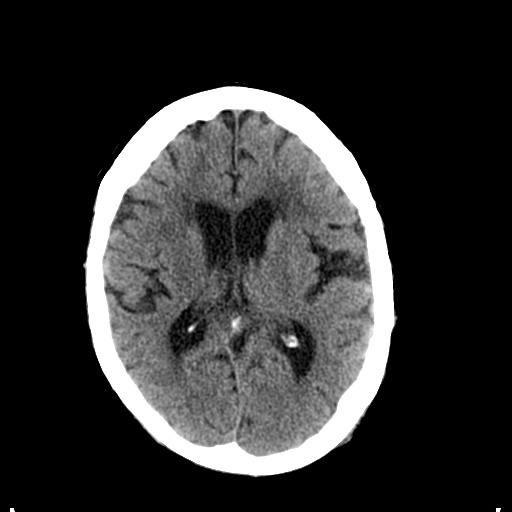
[im 15/30  brain]
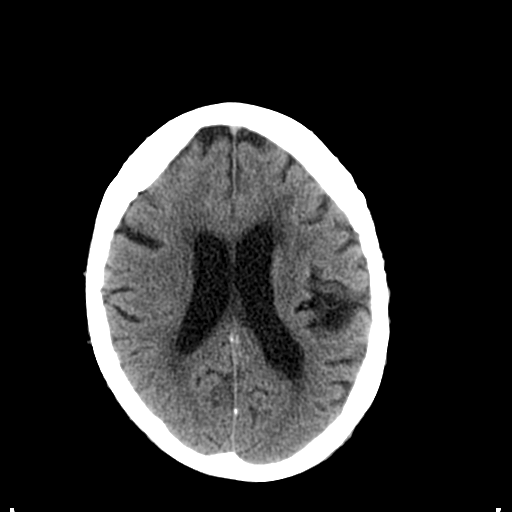
[im 16/30  brain]
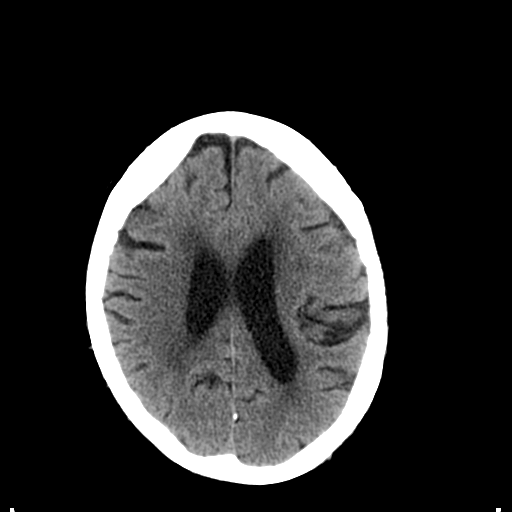
[im 16/30  bone]
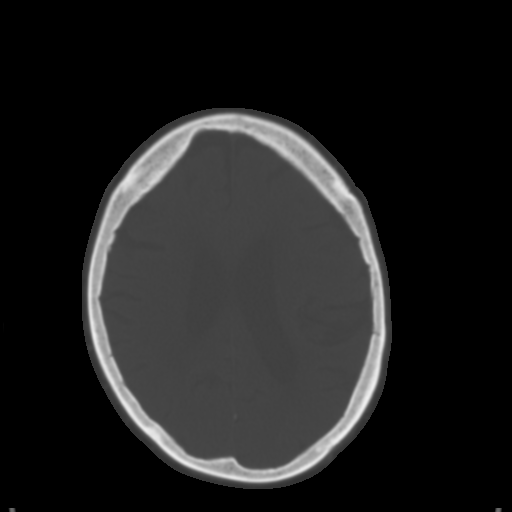
[im 18/30  brain]
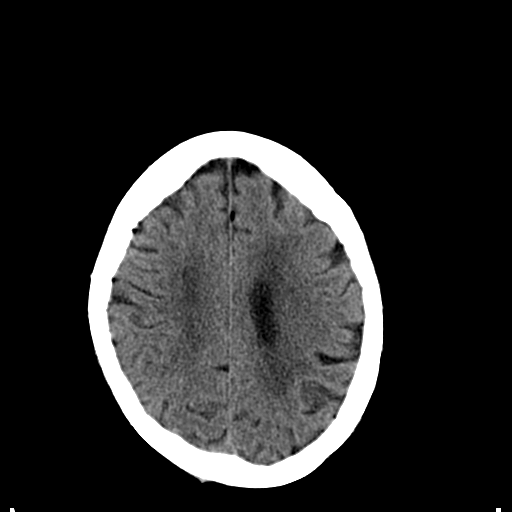
[im 20/30  brain]
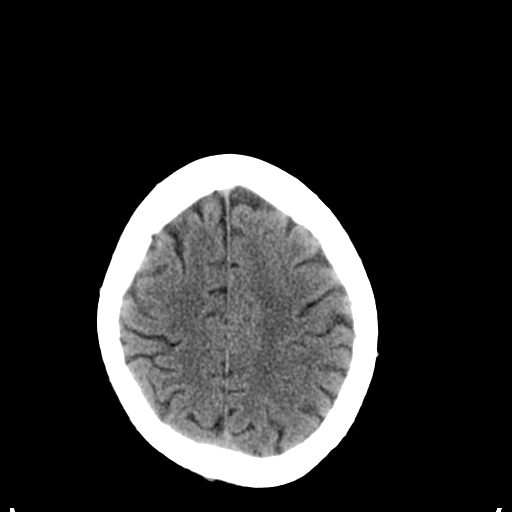
[im 22/30  brain]
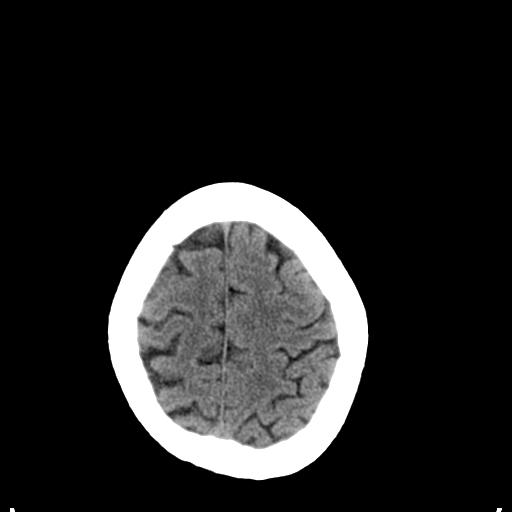
[im 23/30  brain]
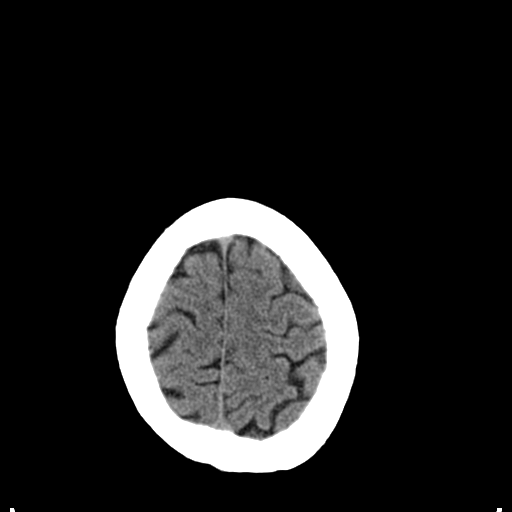
[im 23/30  bone]
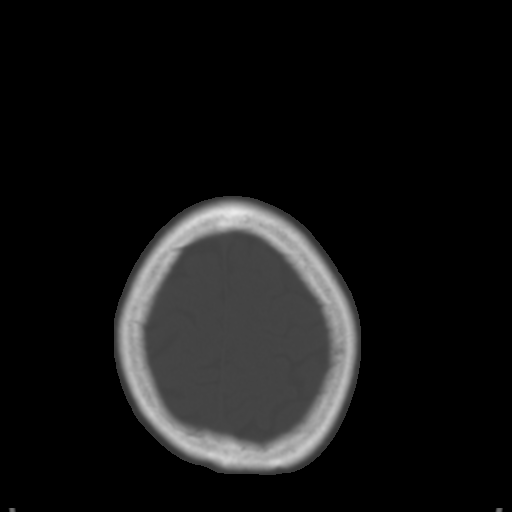
[im 25/30  brain]
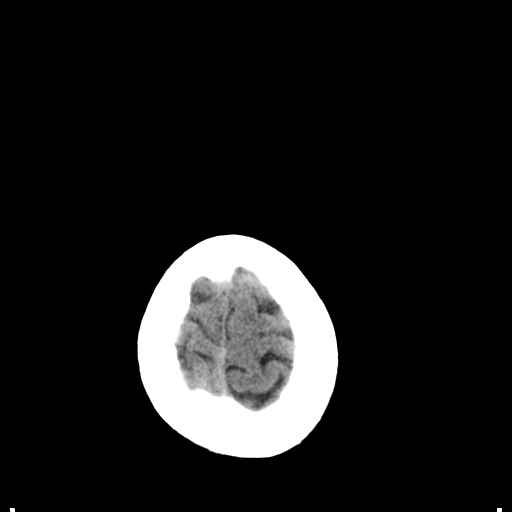
[im 27/30  brain]
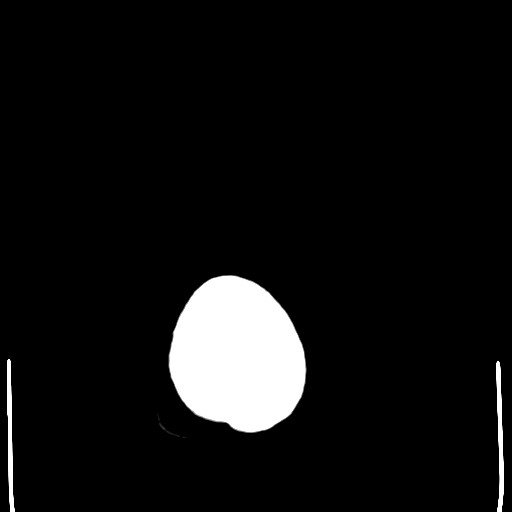
[im 29/30  brain]
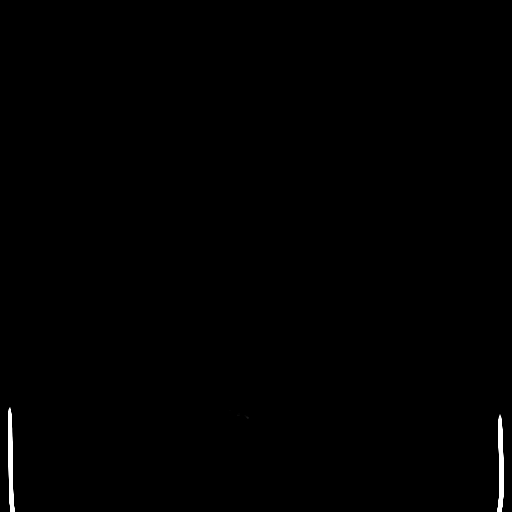

[16 of 30 positions shown; findings below may reference images not displayed]

FINDINGS: Stable mild atrophy pattern and moderate chronic white matter
microvascular ischemic changes. No acute intracranial hemorrhage,
mass lesion, definite infarction, midline shift, herniation,
hydrocephalus, or extra-axial fluid collection. Cisterns are patent.
Cerebellar atrophy as well. Mastoids and sinuses remain clear.
IMPRESSION: Stable atrophy and chronic white matter ischemic changes.

No interval change or acute process by noncontrast CT

## 2017-08-21 ENCOUNTER — Other Ambulatory Visit: Payer: Self-pay

## 2017-08-21 NOTE — Patient Outreach (Signed)
Telephone outreach to patient to obtain mRs was successfully completed. mRs= 2. 

## 2017-09-02 IMAGING — MR MR HEAD W/O CM
6 of 10 series · 21 of 48 positions shown · non-contrast
Comparison: CT from earlier the same day as well as previous MRI
from 09/27/2015.

CLINICAL DATA: Initial evaluation for new onset left-sided
numbness.

EXAM:
MRI HEAD WITHOUT CONTRAST
TECHNIQUE: Multiplanar, multiecho pulse sequences of the brain and surrounding
structures were obtained without intravenous contrast.

[Series 5: T1 · sagittal · 5.0mm · 0.41mm/px · 1 of 21 slices shown (1 of 2)]
[im 1/21]
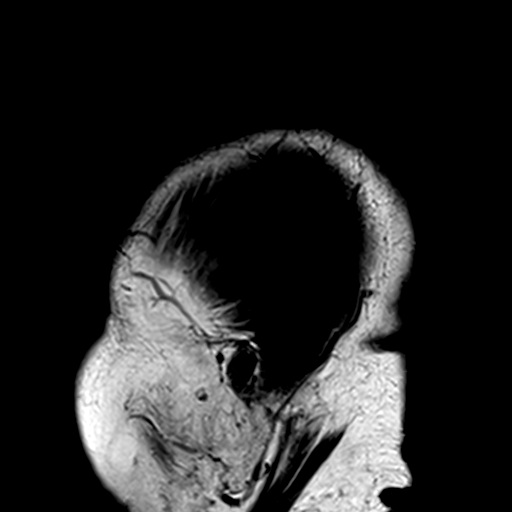

[Series 6: T2 · axial · 5.0mm · 0.49mm/px · z∈[-40,+103]mm · 3 of 23 slices shown (1 of 2)]
[im 1/23]
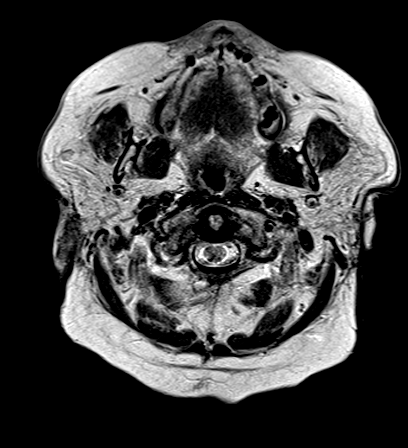
[im 12/23]
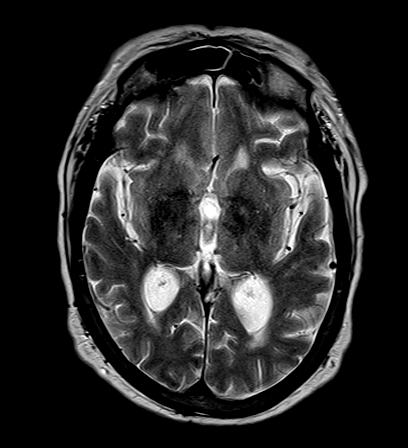
[im 23/23]
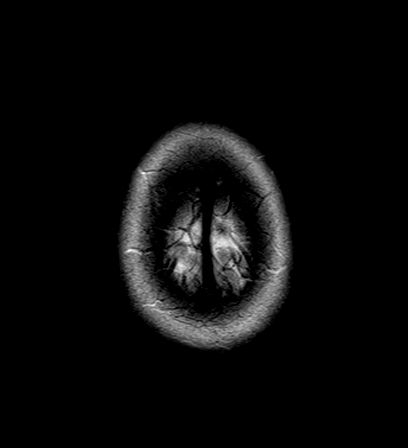

[Series 7: FLAIR · axial · 5.0mm · 0.36mm/px · z∈[-40,+103]mm · 3 of 23 slices shown]
[im 1/23]
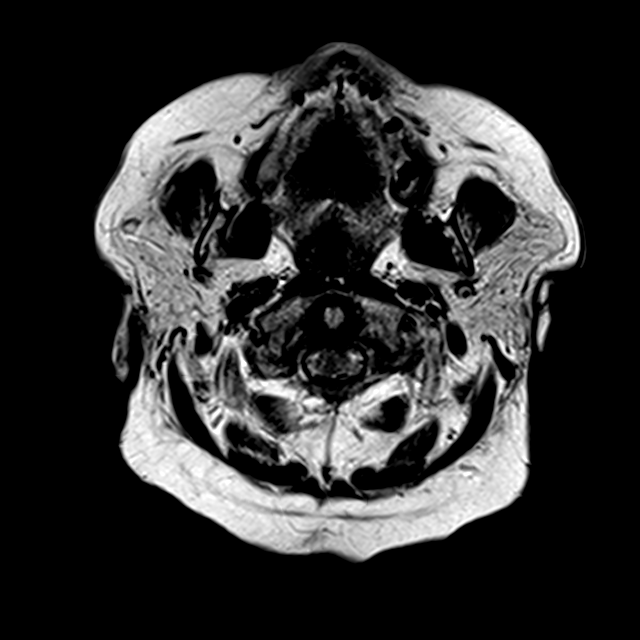
[im 12/23]
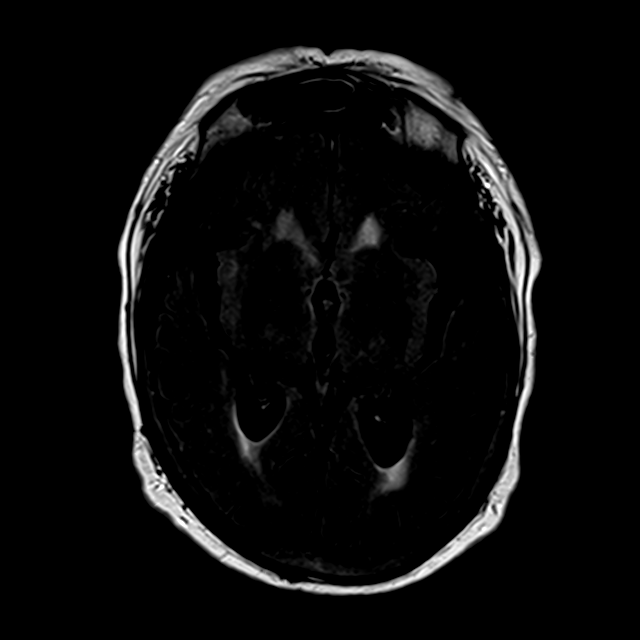
[im 23/23]
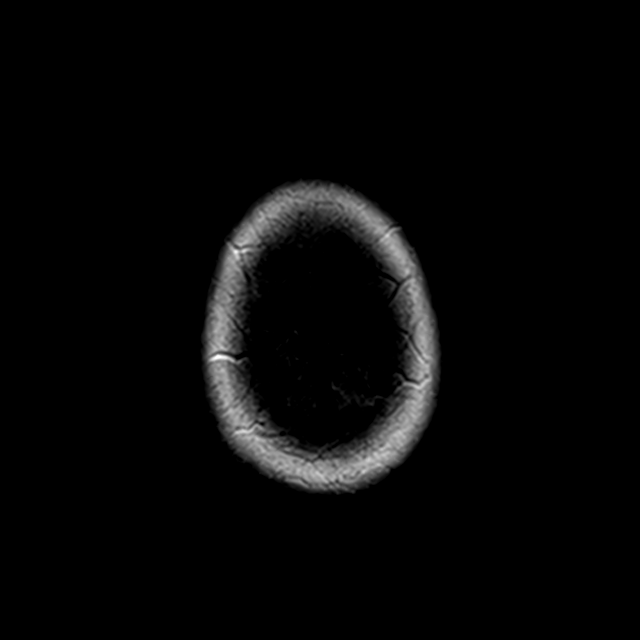

[Series 8: T1 · axial · 2.0mm · 0.43mm/px · z∈[-45,+113]mm · 8 of 80 slices shown (2 of 2)]
[im 1/80]
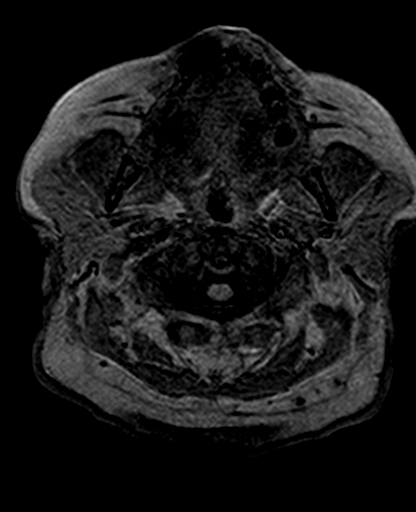
[im 10/80]
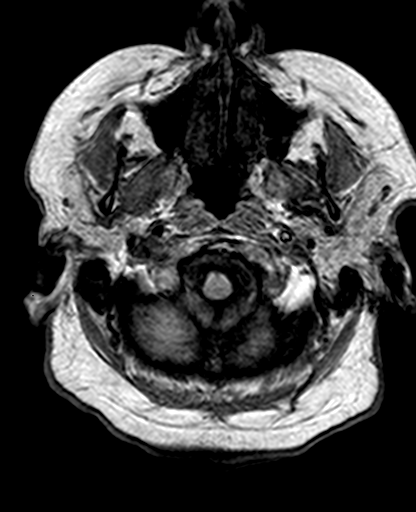
[im 20/80]
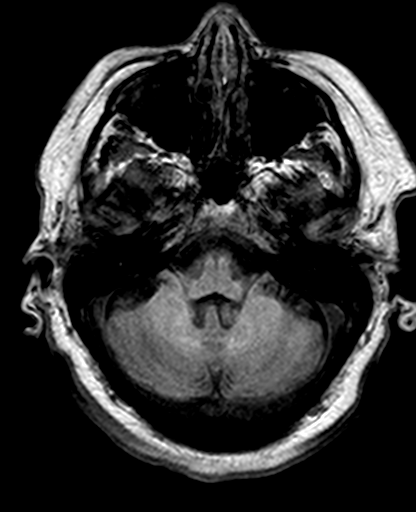
[im 30/80]
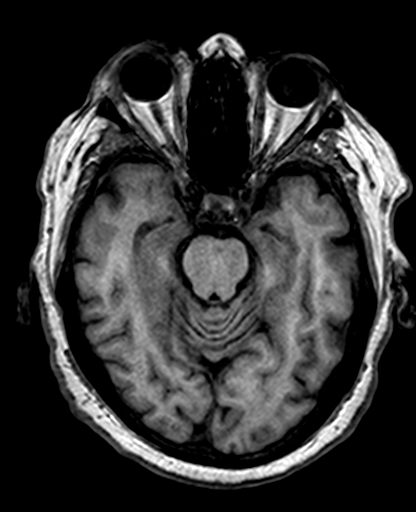
[im 50/80]
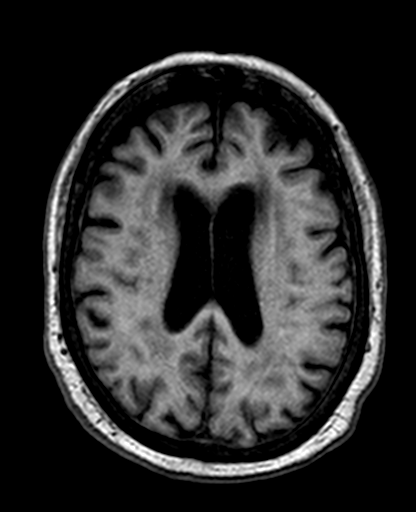
[im 60/80]
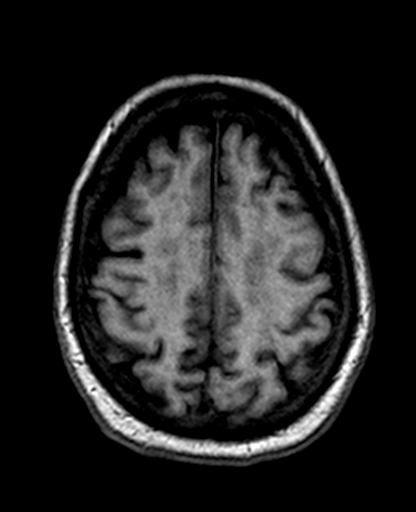
[im 70/80]
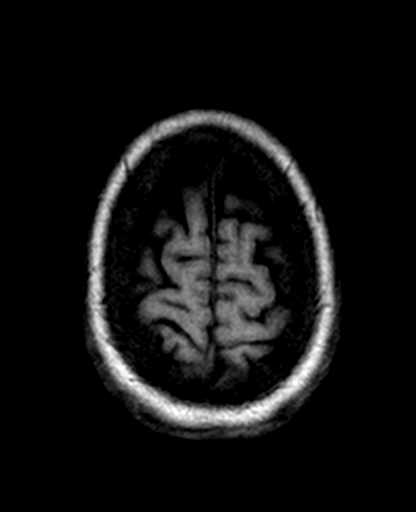
[im 80/80]
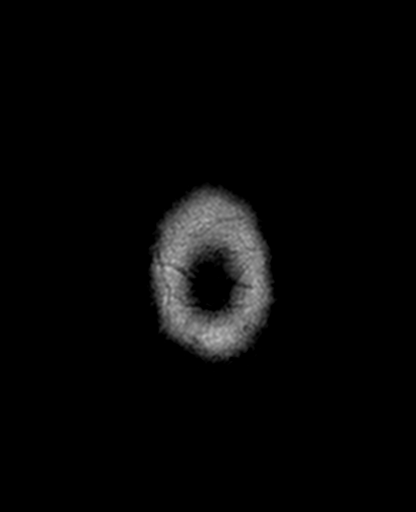

[Series 9: trauma axial · axial · 5.0mm · 0.45mm/px · z∈[-40,+103]mm · 3 of 23 slices shown]
[im 1/23]
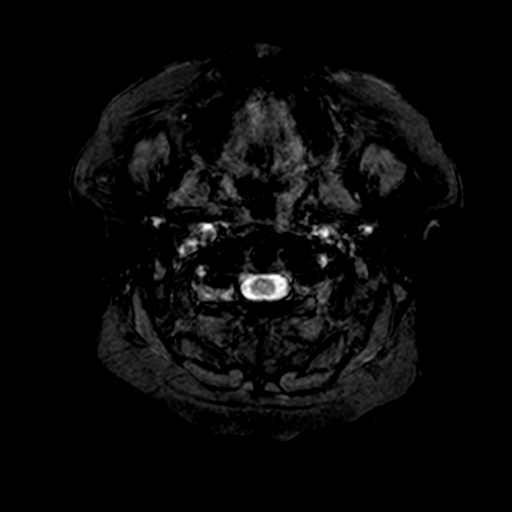
[im 12/23]
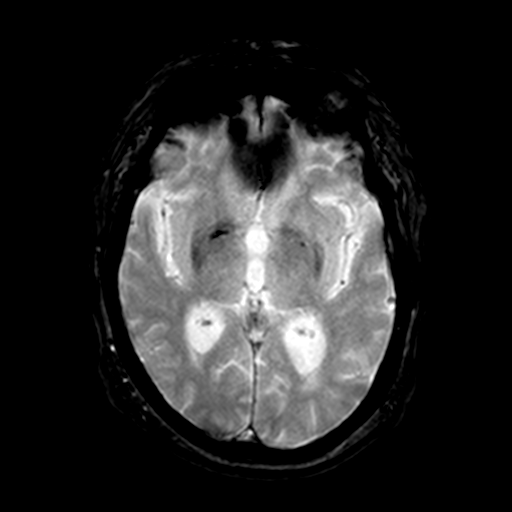
[im 23/23]
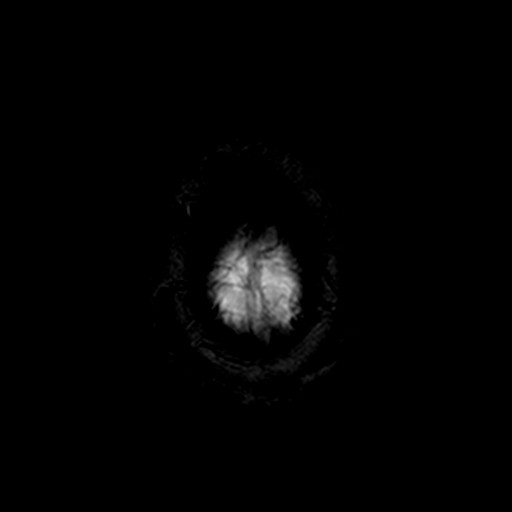

[Series 10: T2 · coronal · 5.0mm · 0.46mm/px · 3 of 28 slices shown (2 of 2)]
[im 1/28]
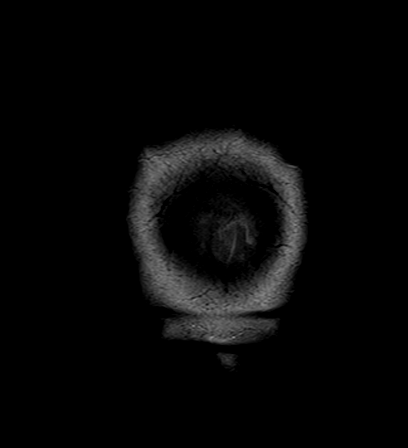
[im 14/28]
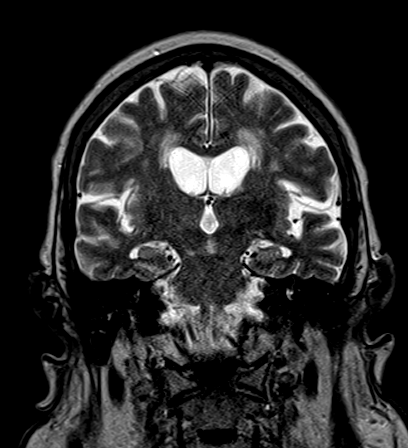
[im 28/28]
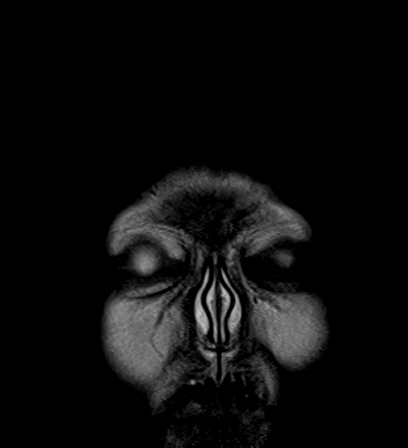

[21 of 48 positions shown; findings below may reference images not displayed]

FINDINGS: No abnormal foci of restricted diffusion to suggest acute
intracranial infarct identified. Gray-white matter differentiation
maintained. Normal intravascular flow voids are maintained. No acute
or chronic intracranial hemorrhage.

Generalized cerebral atrophy with moderate chronic small vessel
ischemic type changes present within the periventricular and deep
white matter both cerebral hemispheres. Small vessel disease present
within the central pons as well. Probable small remote lacunar
infarct within the right thalamus noted.

No mass lesion, midline shift or mass effect. No hydrocephalus. No
extra-axial fluid collection.

Craniocervical junction within normal limits. Incidental note made
of a partially empty sella.

No acute abnormality about the orbits. Sequelae of prior bilateral
cataract extraction noted. Scattered mucosal thickening present
within the frontal sinuses, ethmoidal air cells, and maxillary
sinuses. No air-fluid levels to suggest active sinus infection.
Scattered opacity present within the mastoid air cells bilaterally.
Inner ear structures grossly normal.

Bone marrow signal intensity within normal limits. Subcentimeter
well-circumscribed round FLAIR hyperintense focus within the right
parietal calvarium is stable, and of doubtful clinical significance.
No scalp soft tissue abnormality.
IMPRESSION: 1. No acute intracranial infarct or other process identified.
2. Generalized cerebral atrophy with moderate chronic small vessel
ischemic disease.

## 2017-09-02 IMAGING — CT CT HEAD W/O CM
1 series · 16 of 30 positions shown, 20 images · non-contrast
Comparison: CT scan of head July 18, 2015.

CLINICAL DATA: Left facial numbness, left-sided blurry vision.

EXAM:
CT HEAD WITHOUT CONTRAST
TECHNIQUE: Contiguous axial images were obtained from the base of the skull
through the vertex without intravenous contrast.

[Series 2: headtrauma 4.8 h37s · axial · 0.43mm/px · z∈[+292,+450]mm · 16 of 36 slices shown, 20 images]
[im 2/36  brain]
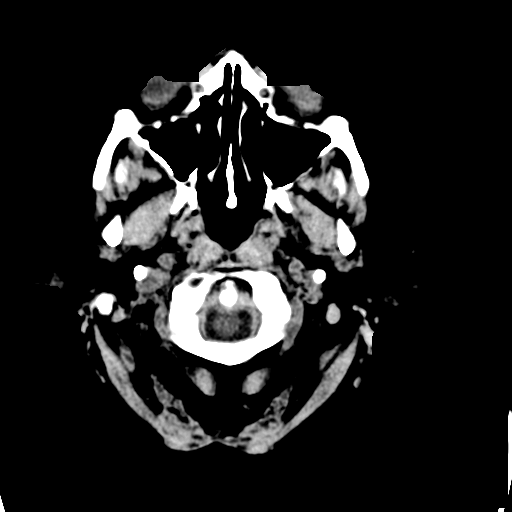
[im 2/36  bone]
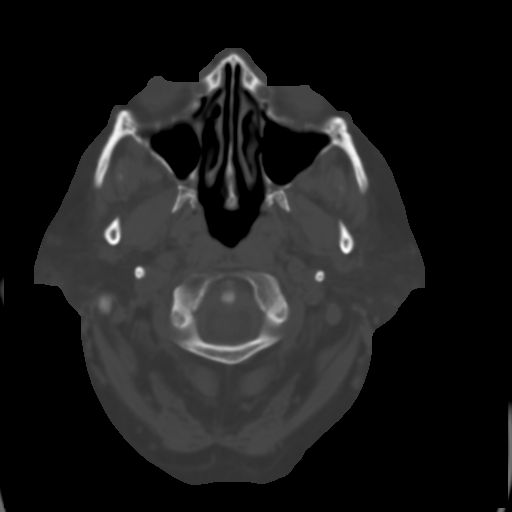
[im 4/36  brain]
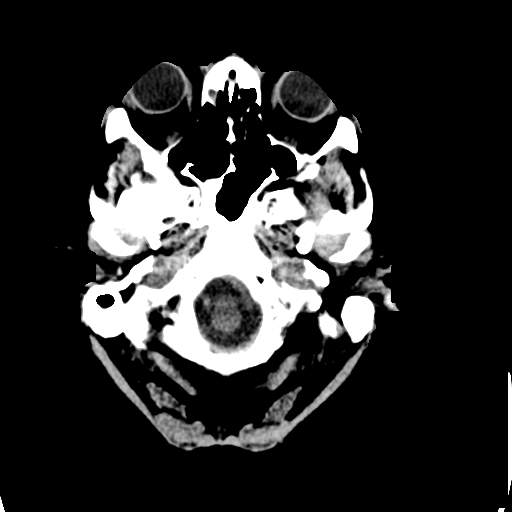
[im 7/36  brain]
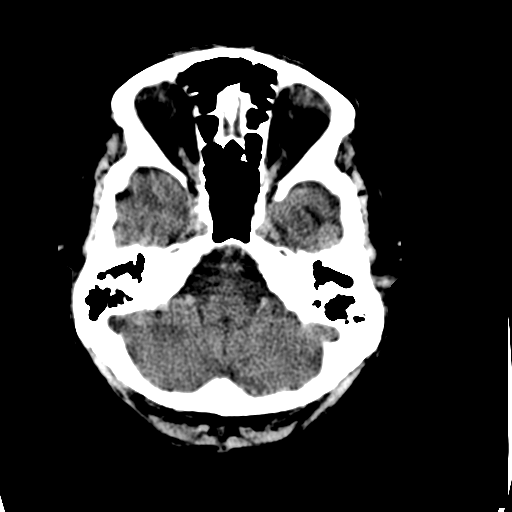
[im 9/36  brain]
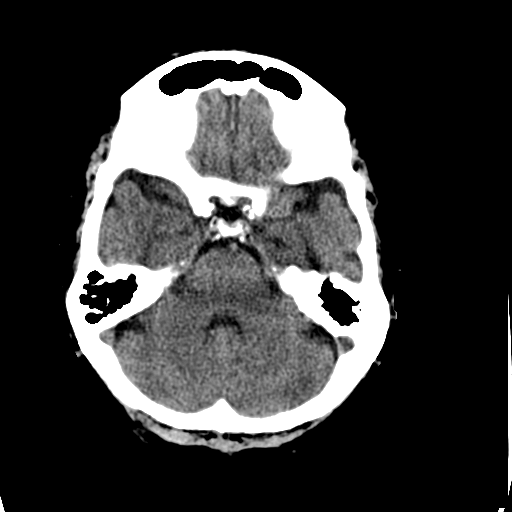
[im 10/36  brain]
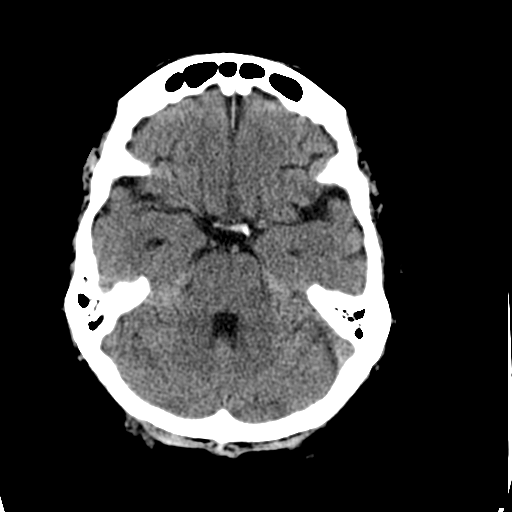
[im 10/36  bone]
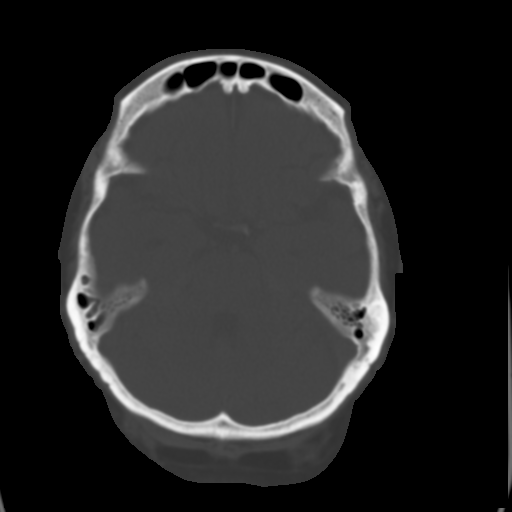
[im 13/36  brain]
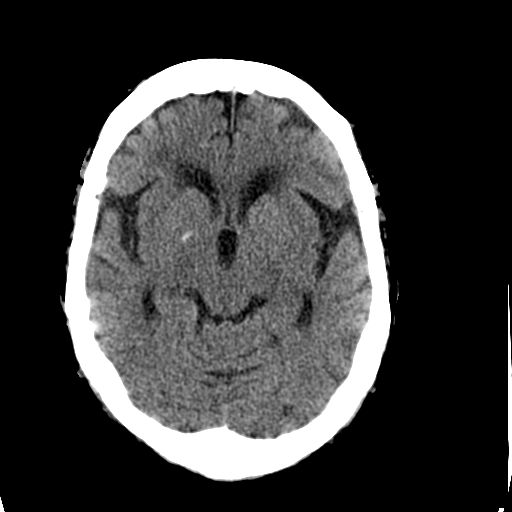
[im 15/36  brain]
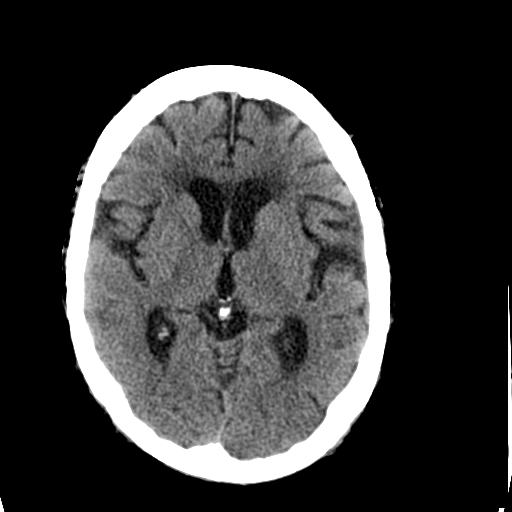
[im 17/36  brain]
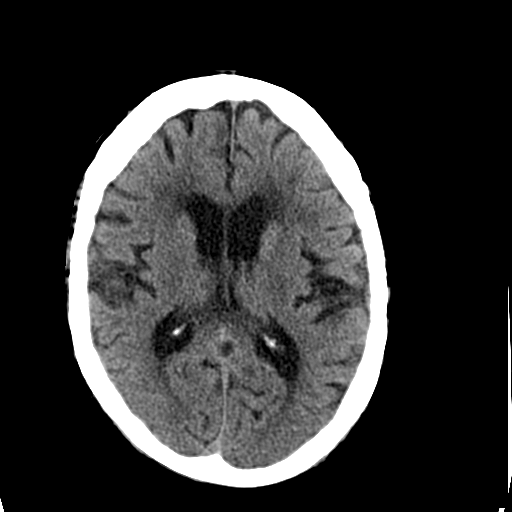
[im 19/36  brain]
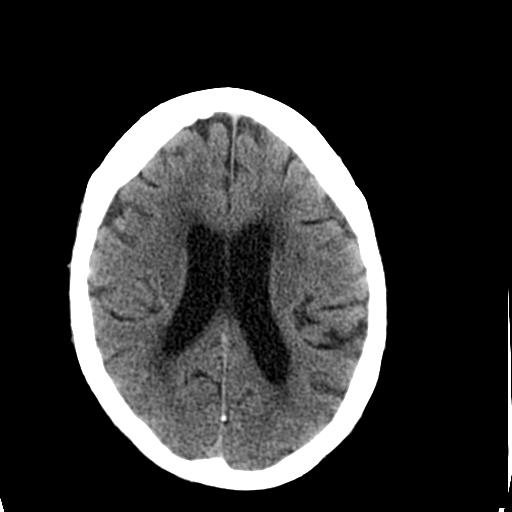
[im 19/36  bone]
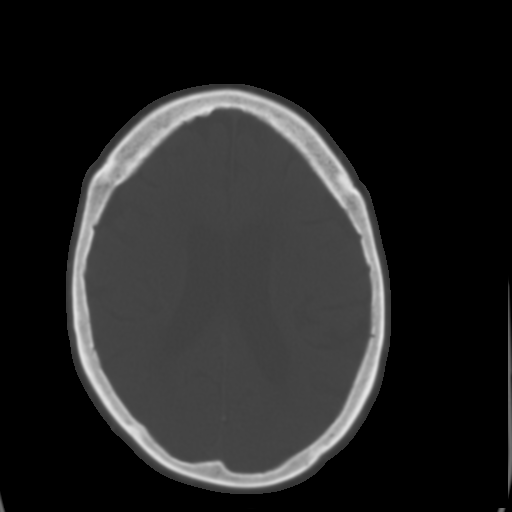
[im 21/36  brain]
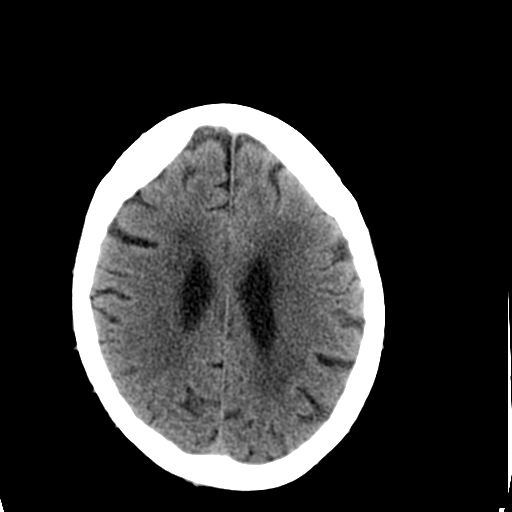
[im 23/36  brain]
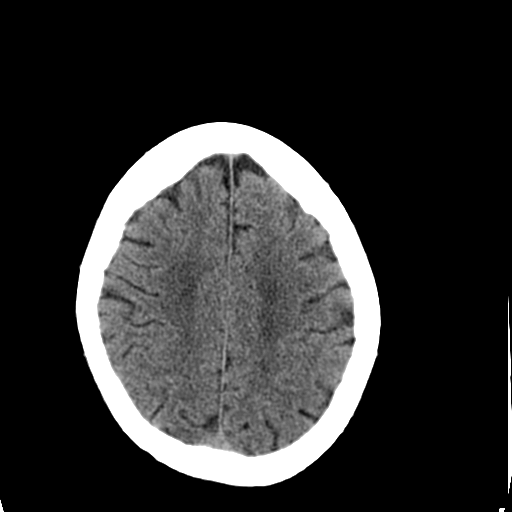
[im 26/36  brain]
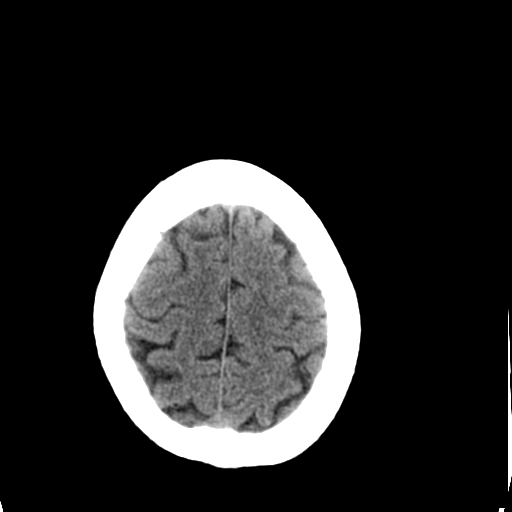
[im 27/36  brain]
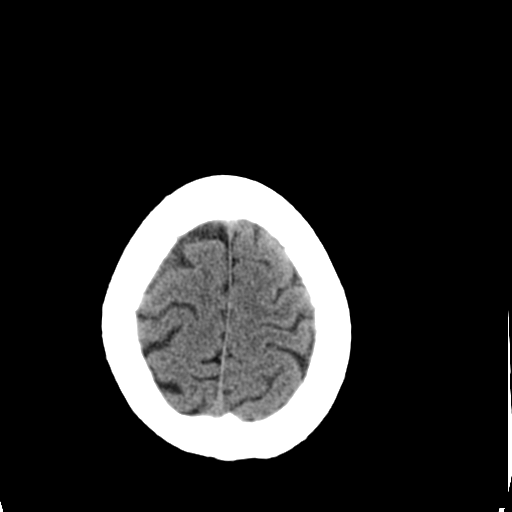
[im 27/36  bone]
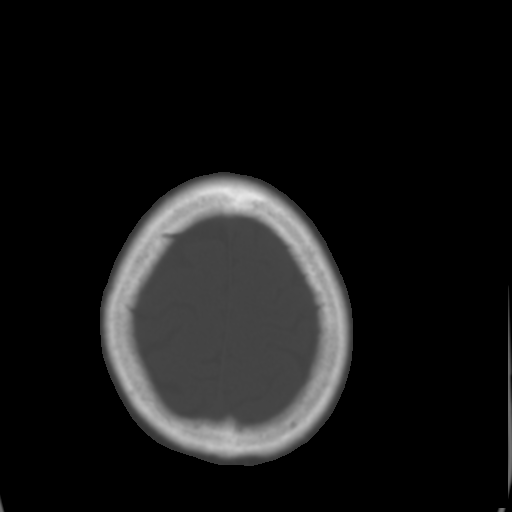
[im 29/36  brain]
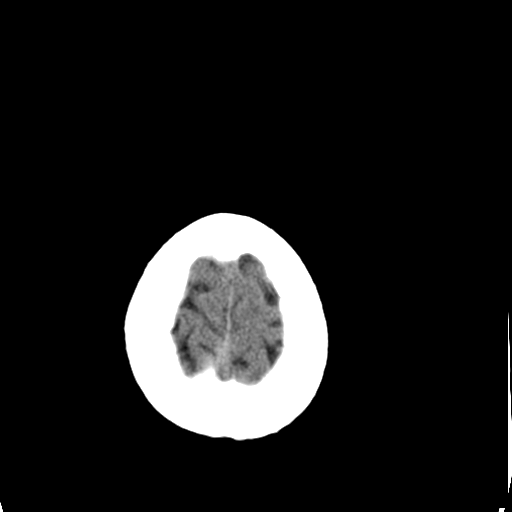
[im 32/36  brain]
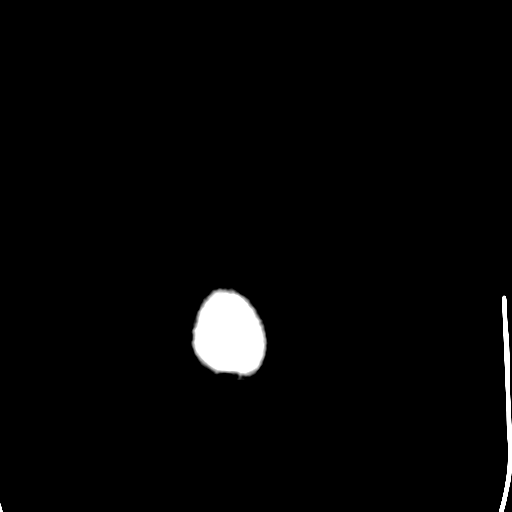
[im 34/36  brain]
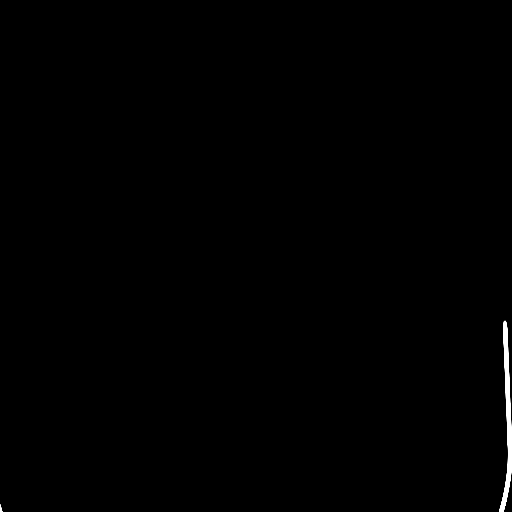

[16 of 30 positions shown; findings below may reference images not displayed]

FINDINGS: Bony calvarium appears intact. Mild diffuse cortical atrophy is
noted. Moderate chronic ischemic white matter disease is noted. No
mass effect or midline shift is noted. Ventricular size is within
normal limits. There is no evidence of mass lesion, hemorrhage or
acute infarction.
IMPRESSION: Mild diffuse cortical atrophy. Moderate chronic ischemic white
matter disease. No acute intracranial abnormality seen. These
results were called by telephone at the time of interpretation on
08/29/2015 at [DATE] to Dr. ZEINAB TIGER , who verbally acknowledged
these results.

## 2017-09-21 ENCOUNTER — Emergency Department (HOSPITAL_COMMUNITY)
Admission: EM | Admit: 2017-09-21 | Discharge: 2017-09-21 | Discharge status: Against Medical Advice | Payer: Medicare HMO

## 2017-09-21 NOTE — ED Notes (Signed)
Per registation patient left ED prior to triage.

## 2017-09-28 ENCOUNTER — Emergency Department (HOSPITAL_COMMUNITY): Payer: Medicare HMO

## 2017-09-28 ENCOUNTER — Encounter (HOSPITAL_COMMUNITY): Payer: Self-pay | Admitting: Emergency Medicine

## 2017-09-28 ENCOUNTER — Observation Stay (HOSPITAL_COMMUNITY)
Admission: EM | Admit: 2017-09-28 | Discharge: 2017-10-01 | Disposition: A | Discharge status: Home / Self Care | Payer: Medicare HMO | Attending: Family Medicine | Admitting: Family Medicine

## 2017-09-28 DIAGNOSIS — Z951 Presence of aortocoronary bypass graft: Secondary | ICD-10-CM | POA: Diagnosis not present

## 2017-09-28 DIAGNOSIS — M5440 Lumbago with sciatica, unspecified side: Secondary | ICD-10-CM

## 2017-09-28 DIAGNOSIS — J441 Chronic obstructive pulmonary disease with (acute) exacerbation: Secondary | ICD-10-CM | POA: Diagnosis not present

## 2017-09-28 DIAGNOSIS — N3000 Acute cystitis without hematuria: Secondary | ICD-10-CM | POA: Diagnosis not present

## 2017-09-28 DIAGNOSIS — E039 Hypothyroidism, unspecified: Secondary | ICD-10-CM | POA: Insufficient documentation

## 2017-09-28 DIAGNOSIS — R350 Frequency of micturition: Secondary | ICD-10-CM | POA: Diagnosis present

## 2017-09-28 DIAGNOSIS — Z91199 Patient's noncompliance with other medical treatment and regimen due to unspecified reason: Secondary | ICD-10-CM

## 2017-09-28 DIAGNOSIS — I110 Hypertensive heart disease with heart failure: Secondary | ICD-10-CM | POA: Diagnosis not present

## 2017-09-28 DIAGNOSIS — I2510 Atherosclerotic heart disease of native coronary artery without angina pectoris: Secondary | ICD-10-CM | POA: Diagnosis not present

## 2017-09-28 DIAGNOSIS — E785 Hyperlipidemia, unspecified: Secondary | ICD-10-CM | POA: Diagnosis present

## 2017-09-28 DIAGNOSIS — D508 Other iron deficiency anemias: Secondary | ICD-10-CM

## 2017-09-28 DIAGNOSIS — E7801 Familial hypercholesterolemia: Secondary | ICD-10-CM

## 2017-09-28 DIAGNOSIS — G8929 Other chronic pain: Secondary | ICD-10-CM

## 2017-09-28 DIAGNOSIS — E032 Hypothyroidism due to medicaments and other exogenous substances: Secondary | ICD-10-CM

## 2017-09-28 DIAGNOSIS — E119 Type 2 diabetes mellitus without complications: Secondary | ICD-10-CM | POA: Insufficient documentation

## 2017-09-28 DIAGNOSIS — E118 Type 2 diabetes mellitus with unspecified complications: Secondary | ICD-10-CM

## 2017-09-28 DIAGNOSIS — M549 Dorsalgia, unspecified: Secondary | ICD-10-CM

## 2017-09-28 DIAGNOSIS — I10 Essential (primary) hypertension: Secondary | ICD-10-CM

## 2017-09-28 DIAGNOSIS — E66813 Obesity, class 3: Secondary | ICD-10-CM

## 2017-09-28 DIAGNOSIS — E78019 Familial hypercholesterolemia, unspecified: Secondary | ICD-10-CM

## 2017-09-28 DIAGNOSIS — Z9114 Patient's other noncompliance with medication regimen: Secondary | ICD-10-CM

## 2017-09-28 DIAGNOSIS — Z955 Presence of coronary angioplasty implant and graft: Secondary | ICD-10-CM | POA: Insufficient documentation

## 2017-09-28 DIAGNOSIS — Z8673 Personal history of transient ischemic attack (TIA), and cerebral infarction without residual deficits: Secondary | ICD-10-CM | POA: Insufficient documentation

## 2017-09-28 DIAGNOSIS — I5189 Other ill-defined heart diseases: Secondary | ICD-10-CM

## 2017-09-28 DIAGNOSIS — I509 Heart failure, unspecified: Secondary | ICD-10-CM | POA: Diagnosis not present

## 2017-09-28 DIAGNOSIS — N390 Urinary tract infection, site not specified: Secondary | ICD-10-CM

## 2017-09-28 DIAGNOSIS — G4733 Obstructive sleep apnea (adult) (pediatric): Secondary | ICD-10-CM | POA: Diagnosis present

## 2017-09-28 LAB — COMPREHENSIVE METABOLIC PANEL
ALK PHOS: 74 U/L (ref 38–126)
ALT: 14 U/L (ref 14–54)
AST: 24 U/L (ref 15–41)
Albumin: 3.8 g/dL (ref 3.5–5.0)
Anion gap: 9 (ref 5–15)
BUN: 20 mg/dL (ref 6–20)
CALCIUM: 9 mg/dL (ref 8.9–10.3)
CHLORIDE: 100 mmol/L — AB (ref 101–111)
CO2: 25 mmol/L (ref 22–32)
CREATININE: 0.85 mg/dL (ref 0.44–1.00)
Glucose, Bld: 296 mg/dL — ABNORMAL HIGH (ref 65–99)
Potassium: 5.3 mmol/L — ABNORMAL HIGH (ref 3.5–5.1)
Sodium: 134 mmol/L — ABNORMAL LOW (ref 135–145)
TOTAL PROTEIN: 6.6 g/dL (ref 6.5–8.1)
Total Bilirubin: 0.5 mg/dL (ref 0.3–1.2)

## 2017-09-28 LAB — CBC WITH DIFFERENTIAL/PLATELET
BASOS PCT: 0 %
Basophils Absolute: 0 10*3/uL (ref 0.0–0.1)
EOS ABS: 0.3 10*3/uL (ref 0.0–0.7)
Eosinophils Relative: 2 %
HCT: 29 % — ABNORMAL LOW (ref 36.0–46.0)
Hemoglobin: 7.9 g/dL — ABNORMAL LOW (ref 12.0–15.0)
Lymphocytes Relative: 35 %
Lymphs Abs: 4.7 10*3/uL — ABNORMAL HIGH (ref 0.7–4.0)
MCH: 17 pg — AB (ref 26.0–34.0)
MCHC: 27.2 g/dL — AB (ref 30.0–36.0)
MCV: 62.2 fL — AB (ref 78.0–100.0)
Monocytes Absolute: 1.4 10*3/uL — ABNORMAL HIGH (ref 0.1–1.0)
Monocytes Relative: 10 %
NEUTROS PCT: 53 %
Neutro Abs: 7.2 10*3/uL (ref 1.7–7.7)
PLATELETS: 346 10*3/uL (ref 150–400)
RBC: 4.66 MIL/uL (ref 3.87–5.11)
RDW: 19 % — AB (ref 11.5–15.5)
WBC: 13.6 10*3/uL — AB (ref 4.0–10.5)

## 2017-09-28 LAB — GLUCOSE, CAPILLARY: GLUCOSE-CAPILLARY: 433 mg/dL — AB (ref 65–99)

## 2017-09-28 LAB — URINALYSIS, ROUTINE W REFLEX MICROSCOPIC
Bilirubin Urine: NEGATIVE
HGB URINE DIPSTICK: NEGATIVE
Ketones, ur: NEGATIVE mg/dL
Nitrite: POSITIVE — AB
PROTEIN: NEGATIVE mg/dL
Specific Gravity, Urine: 1.016 (ref 1.005–1.030)
pH: 5 (ref 5.0–8.0)

## 2017-09-28 LAB — TROPONIN I: Troponin I: <0.03 ng/mL (ref <0.03)

## 2017-09-28 LAB — MAGNESIUM: Magnesium: 2.1 mg/dL (ref 1.7–2.4)

## 2017-09-28 MED ORDER — LEVOTHYROXINE SODIUM 25 MCG PO TABS
25.0000 ug | ORAL_TABLET | Freq: Every day | ORAL | Status: DC
  Administered 2017-09-29 – 2017-10-01 (×3): 25 ug via ORAL
  Filled 2017-09-28 (×3): qty 1

## 2017-09-28 MED ORDER — DEXTROSE 5 % IV SOLN
1.0000 g | Freq: Once | INTRAVENOUS | Status: AC
  Administered 2017-09-28: 1 g via INTRAVENOUS
  Filled 2017-09-28: qty 10

## 2017-09-28 MED ORDER — LISINOPRIL 10 MG PO TABS
10.0000 mg | ORAL_TABLET | Freq: Every day | ORAL | Status: DC
Start: 2017-09-29 — End: 2017-10-01
  Administered 2017-09-29 – 2017-10-01 (×3): 10 mg via ORAL
  Filled 2017-09-28 (×3): qty 1

## 2017-09-28 MED ORDER — GABAPENTIN 300 MG PO CAPS
300.0000 mg | ORAL_CAPSULE | Freq: Two times a day (BID) | ORAL | Status: DC
  Administered 2017-09-29 – 2017-10-01 (×6): 300 mg via ORAL
  Filled 2017-09-28 (×6): qty 1

## 2017-09-28 MED ORDER — INSULIN ASPART PROT & ASPART (70-30 MIX) 100 UNIT/ML ~~LOC~~ SUSP
40.0000 [IU] | Freq: Two times a day (BID) | SUBCUTANEOUS | Status: DC
  Administered 2017-09-29 – 2017-09-30 (×4): 40 [IU] via SUBCUTANEOUS
  Filled 2017-09-28 (×2): qty 10

## 2017-09-28 MED ORDER — OXYCODONE HCL 5 MG PO TABS
10.0000 mg | ORAL_TABLET | ORAL | Status: AC | PRN
  Administered 2017-09-29 (×4): 10 mg via ORAL
  Filled 2017-09-28 (×4): qty 2

## 2017-09-28 MED ORDER — ALBUTEROL SULFATE (2.5 MG/3ML) 0.083% IN NEBU: 2.5000 mg | INHALATION_SOLUTION | RESPIRATORY_TRACT | Status: DC | PRN

## 2017-09-28 MED ORDER — AMLODIPINE BESYLATE 5 MG PO TABS
5.0000 mg | ORAL_TABLET | Freq: Every day | ORAL | Status: DC
  Administered 2017-09-29 – 2017-10-01 (×3): 5 mg via ORAL
  Filled 2017-09-28 (×3): qty 1

## 2017-09-28 MED ORDER — METHYLPREDNISOLONE SODIUM SUCC 125 MG IJ SOLR
125.0000 mg | Freq: Once | INTRAMUSCULAR | Status: AC
  Administered 2017-09-28: 125 mg via INTRAVENOUS
  Filled 2017-09-28: qty 2

## 2017-09-28 MED ORDER — ONDANSETRON HCL 4 MG PO TABS
4.0000 mg | ORAL_TABLET | Freq: Four times a day (QID) | ORAL | Status: DC | PRN
  Administered 2017-09-29: 4 mg via ORAL
  Filled 2017-09-28: qty 1

## 2017-09-28 MED ORDER — LORAZEPAM 1 MG PO TABS
2.0000 mg | ORAL_TABLET | Freq: Every evening | ORAL | Status: DC | PRN
  Administered 2017-09-29 – 2017-09-30 (×2): 2 mg via ORAL
  Filled 2017-09-28 (×2): qty 2

## 2017-09-28 MED ORDER — ASPIRIN EC 325 MG PO TBEC
325.0000 mg | DELAYED_RELEASE_TABLET | Freq: Every day | ORAL | Status: DC
  Administered 2017-09-29 – 2017-10-01 (×3): 325 mg via ORAL
  Filled 2017-09-28 (×3): qty 1

## 2017-09-28 MED ORDER — METHYLPREDNISOLONE SODIUM SUCC 40 MG IJ SOLR
40.0000 mg | Freq: Four times a day (QID) | INTRAMUSCULAR | Status: DC
  Administered 2017-09-29 – 2017-09-30 (×6): 40 mg via INTRAVENOUS
  Filled 2017-09-28 (×6): qty 1

## 2017-09-28 MED ORDER — IPRATROPIUM-ALBUTEROL 0.5-2.5 (3) MG/3ML IN SOLN
3.0000 mL | Freq: Once | RESPIRATORY_TRACT | Status: AC
  Administered 2017-09-28: 3 mL via RESPIRATORY_TRACT
  Filled 2017-09-28: qty 3

## 2017-09-28 MED ORDER — NITROGLYCERIN 0.4 MG SL SUBL: 0.4000 mg | SUBLINGUAL_TABLET | SUBLINGUAL | Status: DC | PRN

## 2017-09-28 MED ORDER — HYDROCODONE-ACETAMINOPHEN 5-325 MG PO TABS
1.0000 | ORAL_TABLET | Freq: Once | ORAL | Status: AC
  Administered 2017-09-28: 1 via ORAL
  Filled 2017-09-28: qty 1

## 2017-09-28 MED ORDER — ENOXAPARIN SODIUM 40 MG/0.4ML ~~LOC~~ SOLN
40.0000 mg | SUBCUTANEOUS | Status: DC
  Administered 2017-09-29 – 2017-10-01 (×3): 40 mg via SUBCUTANEOUS
  Filled 2017-09-28 (×3): qty 0.4

## 2017-09-28 MED ORDER — SODIUM CHLORIDE 0.45 % IV SOLN
INTRAVENOUS | Status: AC
  Administered 2017-09-28: 23:00:00 via INTRAVENOUS

## 2017-09-28 MED ORDER — ONDANSETRON HCL 4 MG/2ML IJ SOLN
4.0000 mg | Freq: Four times a day (QID) | INTRAMUSCULAR | Status: DC | PRN
  Administered 2017-09-29: 4 mg via INTRAVENOUS
  Filled 2017-09-28: qty 2

## 2017-09-28 MED ORDER — METFORMIN HCL 500 MG PO TABS
500.0000 mg | ORAL_TABLET | Freq: Two times a day (BID) | ORAL | Status: DC
  Administered 2017-09-29 – 2017-10-01 (×5): 500 mg via ORAL
  Filled 2017-09-28 (×5): qty 1

## 2017-09-28 MED ORDER — IPRATROPIUM-ALBUTEROL 0.5-2.5 (3) MG/3ML IN SOLN
3.0000 mL | Freq: Four times a day (QID) | RESPIRATORY_TRACT | Status: DC
  Administered 2017-09-29 (×2): 3 mL via RESPIRATORY_TRACT
  Filled 2017-09-28 (×2): qty 3

## 2017-09-28 MED ORDER — INSULIN ASPART 100 UNIT/ML ~~LOC~~ SOLN
0.0000 [IU] | Freq: Three times a day (TID) | SUBCUTANEOUS | Status: DC
  Administered 2017-09-29: 5 [IU] via SUBCUTANEOUS
  Administered 2017-09-29: 8 [IU] via SUBCUTANEOUS
  Administered 2017-09-29: 3 [IU] via SUBCUTANEOUS
  Administered 2017-09-30: 8 [IU] via SUBCUTANEOUS
  Administered 2017-09-30: 11 [IU] via SUBCUTANEOUS
  Administered 2017-09-30 – 2017-10-01 (×3): 5 [IU] via SUBCUTANEOUS

## 2017-09-28 MED ORDER — CYCLOBENZAPRINE HCL 10 MG PO TABS: 5.0000 mg | ORAL_TABLET | Freq: Two times a day (BID) | ORAL | Status: DC | PRN

## 2017-09-28 NOTE — ED Provider Notes (Signed)
Emergency Department Provider Note   I have reviewed the triage vital signs and the nursing notes.   HISTORY  Chief Complaint Urinary Frequency   HPI [name] [name] is a 81 y.o. female with PMH of CAD, DDD, dCHF, HTN, HLD, IDDM, and COPD presents to the emergency department by EMS for evaluation of bronchitis symptoms over the last week that are not improving with home albuterol along with increased dysuria and urinary incontinence over the last 3 days. Patient states that she discussed her bronchitis symptoms with a primary care provider who treated her with albuterol but symptoms are not improving. She is having dyspnea with coughing is worse with exertion. She describes chest pain in the center of her chest and nonradiating that occurs primarily with coughing. She is producing sputum that is non-bloody. Denies fevers or chills.  Patient is also experiencing burning with urination over the past 3 days. No associated diarrhea. She describes multiple episodes of urinary incontinence with associated frequency. She has chronic back pain and states that this is slightly worse but feels similar to her chronic back pain. No lower abdominal pain. No nausea or vomiting. No recent abx.   Past Medical History:  Diagnosis Date  . Arthritis   . CAD (coronary artery disease)    Multivessel status post CABG 2001  . Chronic obstructive pulmonary disease (HCC)   . DDD (degenerative disc disease), lumbar   . Diastolic CHF (HCC)   . Essential hypertension   . Hematuria   . History of CVA (cerebrovascular accident)    2013 - lacunar infarct right thalamus - residual left side of  lip numb  and left eye vision worse (which corrented lens implant from cataract extraction)  . History of MI (myocardial infarction)    July 2001  . Hyperlipidemia   . Hypothyroidism   . Insulin dependent type 2 diabetes mellitus (HCC)   . Lesion of bladder   . Lower urinary tract symptoms (LUTS)   . OSA (obstructive  sleep apnea)   . Peripheral neuropathy   . Psoriasis   . SUI (stress urinary incontinence, female)   . Varicose veins     Patient Active Problem List   Diagnosis Date Noted  . Ischemic stroke (HCC) 05/10/2017  . Acute ischemic stroke (HCC) 05/10/2017  . Stroke (HCC) 08/01/2016  . Dysarthria 03/18/2016  . Influenza-like illness 11/19/2015  . Anemia of chronic disease 11/19/2015  . OSA (obstructive sleep apnea) 11/19/2015  . Left facial numbness 08/29/2015  . Gait instability 06/26/2015  . Bronchitis 11/13/2014  . COPD exacerbation (HCC) 11/13/2014  . UTI (urinary tract infection) 08/26/2014  . Near syncope 08/24/2014  . UTI (lower urinary tract infection) 12/10/2013  . Hypoxia 12/10/2013  . Weakness generalized 12/10/2013  . TIA (transient ischemic attack) 06/22/2012  . Left-sided headache 06/22/2012  . Noncompliance 06/22/2012  . Noncompliance with CPAP treatment 06/22/2012  . Psoriasis 01/07/2012  . Cerebrovascular disease   . Chronic back pain   . Chronic obstructive pulmonary disease (HCC)   . Chest pain 08/28/2011  . DM type 2 causing complication (HCC) 02/14/2010  . Obesity 02/14/2010  . Arteriosclerotic cardiovascular disease (ASCVD) 02/14/2010  . Hypothyroidism 02/06/2010  . Hyperlipidemia 02/06/2010  . Essential hypertension 02/06/2010  . Sleep apnea 02/06/2010    Past Surgical History:  Procedure Laterality Date  . APPENDECTOMY  age 59  . CARDIAC CATHETERIZATION  1991   No sig. cad  . CARDIAC CATHETERIZATION  07-01-2000   high grade in-stent restenosis  .  CARDIAC CATHETERIZATION  04-30-2001   single native vessel cad/ graft patent  . CARDIAC CATHETERIZATION  02-13-2006  dr [place] cad with total occluded LAD  and  70% ostial of small ramus/  grafts patent/  normal ef  . CARDIAC CATHETERIZATION  01-20-2000  dr Clifton James   essentially unchanged;  severe single vessel cad with diffuse disease throughout CFX and RCA/ ef 66%;  chf with preserved LVSF  .  CARDIOVASCULAR STRESS TEST  08-28-2011   DR ROTHBART   NEGATIVE NUCLEAR STUDY/  GLOBAL LVSF/  EF 65%  . CARPAL TUNNEL RELEASE Bilateral yrs ago  . CATARACT EXTRACTION W/ INTRAOCULAR LENS  IMPLANT, BILATERAL  2013  . CHOLECYSTECTOMY  1990  . COLONOSCOPY N/A 07/26/2015   Procedure: COLONOSCOPY;  Surgeon: Malissa Hippo, MD;  Location: [facility];  Service: Endoscopy;  Laterality: N/A;  1200  . CORONARY ANGIOPLASTY  11-13-1999   balloon angioplasty to mLAD , d2 of LAD  . CORONARY ANGIOPLASTY WITH STENT PLACEMENT  01-17-2000   PCI stenting to mLAD & D2 of LAD  . CORONARY ARTERY BYPASS GRAFT  07-02-2000   DR Tressie Stalker   SVG to diagonal, LIMA to LAD  . CYSTOSCOPY W/ RETROGRADES Bilateral 08/09/2014   Procedure: CYSTOSCOPY, BILATERAL RETROGRADE, HYDRODISTENSION, BLADDER BIOPSY WITH FULGERATION, INSTILL PYRIDIUM AND MARCAINE ;  Surgeon: Anner Crete, MD;  Location: [facility];  Service: Urology;  Laterality: Bilateral;  . KNEE ARTHROSCOPY Left 10-18-2003   SYNOVECTOMY  . LUMBAR SPINE SURGERY  x2    yrs ago  . ORIF HUMERUS FRACTURE Right 04-18-2008  . REPAIR FLEXOR TENDON HAND Right 09-20-2008   CARPI RADIALIS TENDON TRANSFER TO EXTENSOR TO INDEX, MIDDLE, RING , LITTLE FINGERS AND DIGITI MININI  . ROTATOR CUFF REPAIR Right 1995  . TOTAL ABDOMINAL HYSTERECTOMY W/ BILATERAL SALPINGOOPHORECTOMY  age 80  . TRANSTHORACIC ECHOCARDIOGRAM  12-12-2013   MILD LVH/  EF 60-65%/  GRADE I DIASTOLIC DYSFUNCTION/  MILD LAE    Current Outpatient Rx  . Order #: [id number] Class: Historical Med  . Order #: [id number] Class: Historical Med  . Order #: [id number] Class: Print  . Order #: [id number] Class: Historical Med  . Order #: [id number] Class: OTC  . Order #: [id number] Class: Print  . Order #: [id number] Class: Historical Med  . Order #: [id number] Class: Historical Med  . Order #: [id number] Class: Historical Med  . Order #: [id number] Class: Historical Med  . Order #: [id number] Class: Historical Med    . Order #: [id number] Class: Historical Med  . Order #: [id number] Class: Historical Med  . Order #: [id number] Class: Historical Med  . Order #: [id number] Class: Historical Med  . Order #: [id number] Class: Historical Med  . Order #: [id number] Class: Historical Med  . Order #: [id number] Class: Historical Med    Allergies Codeine  Family History  Problem Relation Age of Onset  . Hypertension Mother   . Stroke Mother   . Coronary artery disease Unknown        Multiple first and second-degree relatives  . Aneurysm Unknown        Cerebral circulation  . Colon cancer Sister   . Stroke Sister   . Coronary artery disease Sister   . Clotting disorder Unknown        Children diagnosed with hypercoagulable state  . Coronary artery disease Brother     Social History Social History  Substance Use Topics  . Smoking status: Never Smoker  . Smokeless tobacco: Never Used  . Alcohol use No  Review of Systems  Constitutional: No fever/chills Eyes: No visual changes. ENT: No sore throat. Cardiovascular: Positive chest pain with coughing Respiratory: Positive shortness of breath and cough.  Gastrointestinal: No abdominal pain. No nausea, no vomiting. No diarrhea. No constipation. Genitourinary: Positive for dysuria, frequency, and urine incontinence.  Musculoskeletal: Negative for back pain. Skin: Negative for rash. Neurological: Negative for headaches, focal weakness or numbness.  10-point ROS otherwise negative.  ____________________________________________   PHYSICAL EXAM:  VITAL SIGNS: ED Triage Vitals  Enc Vitals Group     BP 09/28/17 1846 (!) 143/62     Pulse Rate 09/28/17 1846 68     Resp 09/28/17 1846 20     Temp 09/28/17 1846 98.3 F (36.8 C)     Temp Source 09/28/17 1846 Oral     SpO2 09/28/17 1846 95 %     Weight 09/28/17 1845 190 lb (86.2 kg)     Height 09/28/17 1845  (1.626 m)     Pain Score 09/28/17 1844 7   Constitutional: Alert and oriented. Appears  slightly SOB which is worse with coughing.  Eyes: Conjunctivae are normal. Head: Atraumatic. Nose: No congestion/rhinnorhea. Mouth/Throat: Mucous membranes are moist.  Neck: No stridor.   Cardiovascular: Normal rate, regular rhythm. Good peripheral circulation. Grossly normal heart sounds.   Respiratory: Increased respiratory effort.  No retractions. Lungs are diffusely wheezing with decreased air entry and primarily end-expiratory wheeze.  Gastrointestinal: Soft and nontender. No distention.  Musculoskeletal: No lower extremity tenderness nor edema. No gross deformities of extremities. Neurologic:  Normal speech and language. No gross focal neurologic deficits are appreciated.  Skin:  Skin is warm, dry and intact. No rash noted.  ____________________________________________   LABS (all labs ordered are listed, but only abnormal results are displayed)  Labs Reviewed  URINALYSIS, ROUTINE W REFLEX MICROSCOPIC - Abnormal; Notable for the following:       Result Value   Color, Urine AMBER (*)    Glucose, UA >=500 (*)    Nitrite POSITIVE (*)    Leukocytes, UA MODERATE (*)    Bacteria, UA MANY (*)    Squamous Epithelial / LPF 0-5 (*)    All other components within normal limits  COMPREHENSIVE METABOLIC PANEL - Abnormal; Notable for the following:    Sodium 134 (*)    Potassium 5.3 (*)    Chloride 100 (*)    Glucose, Bld 296 (*)    All other components within normal limits  CBC WITH DIFFERENTIAL/PLATELET - Abnormal; Notable for the following:    WBC 13.6 (*)    Hemoglobin 7.9 (*)    HCT 29.0 (*)    MCV 62.2 (*)    MCH 17.0 (*)    MCHC 27.2 (*)    RDW 19.0 (*)    Lymphs Abs 4.7 (*)    Monocytes Absolute 1.4 (*)    All other components within normal limits  URINE CULTURE  TROPONIN I  MAGNESIUM   ____________________________________________  EKG   EKG Interpretation  Date/Time:  Sunday September 28 2017 19:15:14 EDT Ventricular Rate:  67 PR Interval:    QRS  Duration: 101 QT Interval:  414 QTC Calculation: 437 R Axis:   -51 Text Interpretation:  Sinus rhythm Left anterior fascicular block Anterior infarct, old Minimal ST depression, lateral leads No STEMI.  Confirmed by Alona Bene [phone]) on 09/28/2017 7:32:51 PM       ____________________________________________  RADIOLOGY  Dg Chest 2 View  Result Date: 09/28/2017 CLINICAL DATA:  Cough x3  days EXAM: CHEST  2 VIEW COMPARISON:  05/19/2017 FINDINGS: Status post CABG with minimal aortic atherosclerosis. Top normal size cardiac silhouette. No pneumonic consolidation nor overt pulmonary edema. Stable pulmonary scarring in the left upper lobe. No acute nor suspicious osseous abnormalities. Degenerative changes are stable along the dorsal spine. IMPRESSION: Status post CABG. Left upper lobe scarring. No pneumonic consolidation or effusion. Electronically Signed   By: Tollie Eth M.D.   On: 09/28/2017 20:32    ____________________________________________   PROCEDURES  Procedure(s) performed:   Procedures  None ____________________________________________   INITIAL IMPRESSION / ASSESSMENT AND PLAN / ED COURSE  Pertinent labs & imaging results that were available during my care of the patient were reviewed by me and considered in my medical decision making (see chart for details).  Patient presents to the emergency department for evaluation of 2 separate complaints. First she is experiencing significant urinary frequency, dysuria, urinary incontinence. No signs or symptoms to suggest pyelonephritis. Patient is afebrile with normal vital signs other than elevated blood pressure. Second, patient is also complaining of ongoing bronchitis symptoms for the past week. She has a history of COPD and is not on home oxygen. No hypoxemia here. Exam the patient has mild increased work of breathing with significant decreased air entry with end expiratory wheezing consistent with COPD exacerbation. Exam is  symmetrical. Lower suspicion for pneumonia. She is experiencing some chest pain in addition respiratory symptoms. Plan for labs, EKG, troponin, chest x-ray, IV steroids with no evidence of urosepsis, and DuoNeb.  Patient with UTI on UA. Starting Ceftriaxone. Continues to have significant wheezing with some intermittent hypoxemia (O2 sat 86% while talking) but then rises to low 90s. Plan for additional neb and admission for further treatments and monitoring.   Discussed patient's case with Hospitalist, Dr. Robb Matar to request admission. Patient and family (if present) updated with plan. Care transferred to Hospitalist service.  I reviewed all nursing notes, vitals, pertinent old records, EKGs, labs, imaging (as available).   ____________________________________________  FINAL CLINICAL IMPRESSION(S) / ED DIAGNOSES  Final diagnoses:  COPD exacerbation (HCC)  Acute cystitis without hematuria     MEDICATIONS GIVEN DURING THIS VISIT:  Medications  cefTRIAXone (ROCEPHIN) 1 g in dextrose 5 % 50 mL IVPB (1 g Intravenous New Bag/Given 09/28/17 2147)  ipratropium-albuterol (DUONEB) 0.5-2.5 (3) MG/3ML nebulizer solution 3 mL (not administered)  ipratropium-albuterol (DUONEB) 0.5-2.5 (3) MG/3ML nebulizer solution 3 mL (3 mLs Nebulization Given 09/28/17 1924)  methylPREDNISolone sodium succinate (SOLU-MEDROL) 125 mg/2 mL injection 125 mg (125 mg Intravenous Given 09/28/17 1918)  HYDROcodone-acetaminophen (NORCO/VICODIN) 5-325 MG per tablet 1 tablet (1 tablet Oral Given 09/28/17 2052)  ipratropium-albuterol (DUONEB) 0.5-2.5 (3) MG/3ML nebulizer solution 3 mL (3 mLs Nebulization Given 09/28/17 2107)     NEW OUTPATIENT MEDICATIONS STARTED DURING THIS VISIT:  None   Note:  This document was prepared using Dragon voice recognition software and may include unintentional dictation errors.  Alona Bene, MD Emergency Medicine    Rogenia Werntz, Arlyss Repress, MD 09/28/17 2204

## 2017-09-28 NOTE — ED Triage Notes (Signed)
Pt reports urinary frequency and dysuria x3 days. Pt reports intermittent chills. nad noted.

## 2017-09-28 NOTE — H&P (Signed)
History and Physical    [name] [name] ZOX:[id number] DOB: March 08, 1936 DOA: 09/28/2017  PCP: Oval Linsey, MD   Patient coming from: Home.  I have personally briefly reviewed patient's old medical records in [facility]  Chief Complaint: Painful urination and chills.  HPI: [name] [name] is a 81 y.o. female with medical history significant of osteoarthritis, CAD/CABG, COPD, OSA not on CPAP, essential hypertension, diastolic CHF, history of hematuria/lesion in bladder, history of CVA in 2013, hyperlipidemia, hypothyroidism, insulin-dependent type 2 diabetes mellitus, diabetic peripheral neuropathy, psoriasis, chronic constipation who is coming to the emergency department with complaints of intermittent chills, frequency and dysuria for 3 days. She also complains of progressively worse shortness of breath, wheezing, pleuritic chest pain productive cough of yellowish sputum and fatigue for several days. She states that she saw her primary care provider who prescribed her a bronchodilator inhaler, but has not improved. She denies rhinorrhea, sore throat, sick contacts or travel history. She denies dizziness, palpitations, nausea, emesis, PND, orthopnea or pitting edema of the lower extremities. No abdominal pain, diarrhea, melena or hematochezia, but complains of frequent constipation. Her urinary symptoms may have been worsened by hyperglycemia leading to polyuria and polydipsia. Denies blurred vision.  ED Course: Initial vital signs in the emergency department were temperature 98.80F, pulse 68, blood pressure 143/62 mmHg, respirations 20 and O2 sat 95% on room air. She received 3 DuoNeb nebulizer treatments, Solu-Medrol 125 mg IVP 1 and Rocephin 1 g IVPB.  Her workup shows an urinary analysis with positive nitrites, moderate leukocyte esterase, 6-30 WBC and many bacteria. Her CBC showed WBC of 13.6 with 53% neutrophils, hemoglobin 7.9 g/dL and platelets 811. BMP showed a potassium of 5.3  mmol/L and glucose of 296 mg/dL, sodium corrected to serum glucose and all other values were normal. First troponin level was normal. EKG showed old anterior infarct and minimal ST elevation of the lateral leads, not significantly changed from previous. Her chest radiograph showed signs of previous CABG and stable left upper lobe scaring. There was no consolidation or effusion. Please see image and full radiology report for further detail.  Review of Systems: As per HPI otherwise 10 point review of systems negative.    Past Medical History:  Diagnosis Date  . Arthritis   . CAD (coronary artery disease)    Multivessel status post CABG 2001  . Chronic obstructive pulmonary disease (HCC)   . DDD (degenerative disc disease), lumbar   . Diastolic CHF (HCC)   . Essential hypertension   . Hematuria   . History of CVA (cerebrovascular accident)    2013 - lacunar infarct right thalamus - residual left side of  lip numb  and left eye vision worse (which corrented lens implant from cataract extraction)  . History of MI (myocardial infarction)    July 2001  . Hyperlipidemia   . Hypothyroidism   . Insulin dependent type 2 diabetes mellitus (HCC)   . Lesion of bladder   . Lower urinary tract symptoms (LUTS)   . OSA (obstructive sleep apnea)   . Peripheral neuropathy   . Psoriasis   . SUI (stress urinary incontinence, female)   . Varicose veins     Past Surgical History:  Procedure Laterality Date  . APPENDECTOMY  age 59  . CARDIAC CATHETERIZATION  1991   No sig. cad  . CARDIAC CATHETERIZATION  07-01-2000   high grade in-stent restenosis  . CARDIAC CATHETERIZATION  04-30-2001   single native vessel cad/ graft patent  .  CARDIAC CATHETERIZATION  02-13-2006  dr [place] cad with total occluded LAD  and  70% ostial of small ramus/  grafts patent/  normal ef  . CARDIAC CATHETERIZATION  01-20-2000  dr Clifton James   essentially unchanged;  severe single vessel cad with diffuse disease throughout  CFX and RCA/ ef 66%;  chf with preserved LVSF  . CARDIOVASCULAR STRESS TEST  08-28-2011   DR ROTHBART   NEGATIVE NUCLEAR STUDY/  GLOBAL LVSF/  EF 65%  . CARPAL TUNNEL RELEASE Bilateral yrs ago  . CATARACT EXTRACTION W/ INTRAOCULAR LENS  IMPLANT, BILATERAL  2013  . CHOLECYSTECTOMY  1990  . COLONOSCOPY N/A 07/26/2015   Procedure: COLONOSCOPY;  Surgeon: Malissa Hippo, MD;  Location: [facility];  Service: Endoscopy;  Laterality: N/A;  1200  . CORONARY ANGIOPLASTY  11-13-1999   balloon angioplasty to mLAD , d2 of LAD  . CORONARY ANGIOPLASTY WITH STENT PLACEMENT  01-17-2000   PCI stenting to mLAD & D2 of LAD  . CORONARY ARTERY BYPASS GRAFT  07-02-2000   DR Tressie Stalker   SVG to diagonal, LIMA to LAD  . CYSTOSCOPY W/ RETROGRADES Bilateral 08/09/2014   Procedure: CYSTOSCOPY, BILATERAL RETROGRADE, HYDRODISTENSION, BLADDER BIOPSY WITH FULGERATION, INSTILL PYRIDIUM AND MARCAINE ;  Surgeon: Anner Crete, MD;  Location: [facility];  Service: Urology;  Laterality: Bilateral;  . KNEE ARTHROSCOPY Left 10-18-2003   SYNOVECTOMY  . LUMBAR SPINE SURGERY  x2    yrs ago  . ORIF HUMERUS FRACTURE Right 04-18-2008  . REPAIR FLEXOR TENDON HAND Right 09-20-2008   CARPI RADIALIS TENDON TRANSFER TO EXTENSOR TO INDEX, MIDDLE, RING , LITTLE FINGERS AND DIGITI MININI  . ROTATOR CUFF REPAIR Right 1995  . TOTAL ABDOMINAL HYSTERECTOMY W/ BILATERAL SALPINGOOPHORECTOMY  age 55  . TRANSTHORACIC ECHOCARDIOGRAM  12-12-2013   MILD LVH/  EF 60-65%/  GRADE I DIASTOLIC DYSFUNCTION/  MILD LAE     reports that she has never smoked. She has never used smokeless tobacco. She reports that she does not drink alcohol or use drugs.  Allergies  Allergen Reactions  . Codeine Itching    Family History  Problem Relation Age of Onset  . Hypertension Mother   . Stroke Mother   . Coronary artery disease Unknown        Multiple first and second-degree relatives  . Aneurysm Unknown        Cerebral circulation  .  Colon cancer Sister   . Stroke Sister   . Coronary artery disease Sister   . Clotting disorder Unknown        Children diagnosed with hypercoagulable state  . Coronary artery disease Brother     Prior to Admission medications   Medication Sig Start Date End Date Taking? Authorizing Provider  amLODipine (NORVASC) 5 MG tablet Take 5 mg by mouth daily. 07/03/17  Yes [provider]  aspirin EC 325 MG tablet Take 325 mg by mouth daily.   Yes [provider]  cyclobenzaprine (FLEXERIL) 5 MG tablet Take 1 tablet (5 mg total) by mouth 2 (two) times daily as needed for muscle spasms. 05/19/17  Yes Blane Ohara, MD  gabapentin (NEURONTIN) 300 MG capsule Take 300 mg by mouth 2 (two) times daily. 09/18/17  Yes [provider]  guaiFENesin (ROBITUSSIN) 100 MG/5ML SOLN Take 10 mLs (200 mg total) by mouth every 6 (six) hours as needed for cough or to loosen phlegm. 05/13/17  Yes Rinehuls, Kinnie Scales, PA-C  HYDROcodone-acetaminophen (NORCO) 5-325 MG  tablet Take 1-2 tablets by mouth every 4 (four) hours as needed (do not take with your home norco). 05/19/17  Yes Blane Ohara, MD  insulin NPH-regular Human (NOVOLIN 70/30) (70-30) 100 UNIT/ML injection Inject 40 Units into the skin 2 (two) times daily.   Yes [provider]  ipratropium-albuterol (DUONEB) 0.5-2.5 (3) MG/3ML SOLN Inhale 3 mLs into the lungs 4 (four) times daily as needed. 09/19/17  Yes [provider]  levothyroxine (SYNTHROID, LEVOTHROID) 25 MCG tablet Take 25 mcg by mouth daily.    Yes [provider]  lisinopril (PRINIVIL,ZESTRIL) 10 MG tablet Take 10 mg by mouth daily.   Yes [provider]  LORazepam (ATIVAN) 2 MG tablet Take 2 mg by mouth at bedtime as needed for sleep.    Yes [provider]  metFORMIN (GLUCOPHAGE) 500 MG tablet Take 500 mg by mouth 2 (two) times daily with a meal.     Yes [provider]  OVER THE COUNTER MEDICATION Place 1 drop into both eyes 3  (three) times daily as needed (eye drops). OTC lubricating eye drops   Yes [provider]  OVER THE COUNTER MEDICATION Apply 1 application topically 2 (two) times daily as needed (itching/psoriasis flares). OTC Psoriasis cream   Yes [provider]  Oxycodone HCl 10 MG TABS Take 10 mg by mouth every 4 (four) hours. 08/24/17  Yes [provider]  predniSONE (DELTASONE) 20 MG tablet Take 20 mg by mouth 4 (four) times daily. 09/27/17  Yes [provider]  VITAMIN D, ERGOCALCIFEROL, PO Take 1 capsule by mouth daily.   Yes [provider]  nitroGLYCERIN (NITROSTAT) 0.4 MG SL tablet Place 0.4 mg under the tongue every 5 (five) minutes as needed for chest pain.     [provider]    Physical Exam: Vitals:   09/28/17 2130 09/28/17 2215 09/28/17 2303 09/28/17 2310  BP: (!) 159/65   (!) 136/51  Pulse: 72   74  Resp: 20     Temp:    98.1 F (36.7 C)  TempSrc:   Oral Oral  SpO2: 92% 96%  97%  Weight:   86 kg (189 lb 11.2 oz)   Height:        Constitutional: NAD, calm, comfortable Eyes: PERRL, lids and conjunctivae normal ENMT: Mucous membranes are mildly dry. Posterior pharynx clear of any exudate or lesions. Neck: normal, supple, no masses, no thyromegaly Respiratory: Bilateral mild rhonchi and wheezing, no crackles. Normal respiratory effort. No accessory muscle use.  Cardiovascular: Regular rate and rhythm, no murmurs / rubs / gallops. No extremity edema. 2+ pedal pulses. No carotid bruits.  Abdomen: Positive suprapubic tenderness, no masses palpated. No hepatosplenomegaly. Bowel sounds positive. No CVA tenderness.  Musculoskeletal: no clubbing / cyanosis. Good ROM, no contractures. Normal muscle tone.  Skin: no rashes, lesions, ulcers on limited skin exam. Neurologic: CN 2-12 grossly intact. Sensation intact, DTR normal. Strength 5/5 in all 4.  Psychiatric: Normal judgment and insight. Alert and oriented x 4. Normal mood.    Labs on  Admission: I have personally reviewed following labs and imaging studies  CBC:  Recent Labs Lab 09/28/17 1910  WBC 13.6*  NEUTROABS 7.2  HGB 7.9*  HCT 29.0*  MCV 62.2*  PLT 346   Basic Metabolic Panel:  Recent Labs Lab 09/28/17 1910  NA 134*  K 5.3*  CL 100*  CO2 25  GLUCOSE 296*  BUN 20  CREATININE 0.85  CALCIUM 9.0  MG 2.1  GFR: Estimated Creatinine Clearance: 56.9 mL/min (by C-G formula based on SCr of 0.85 mg/dL). Liver Function Tests:  Recent Labs Lab 09/28/17 1910  AST 24  ALT 14  ALKPHOS 74  BILITOT 0.5  PROT 6.6  ALBUMIN 3.8   No results for input(s): LIPASE, AMYLASE in the last 168 hours. No results for input(s): AMMONIA in the last 168 hours. Coagulation Profile: No results for input(s): INR, PROTIME in the last 168 hours. Cardiac Enzymes:  Recent Labs Lab 09/28/17 1910  TROPONINI <0.03   BNP (last 3 results) No results for input(s): PROBNP in the last 8760 hours. HbA1C: No results for input(s): HGBA1C in the last 72 hours. CBG: No results for input(s): GLUCAP in the last 168 hours. Lipid Profile: No results for input(s): CHOL, HDL, LDLCALC, TRIG, CHOLHDL, LDLDIRECT in the last 72 hours. Thyroid Function Tests: No results for input(s): TSH, T4TOTAL, FREET4, T3FREE, THYROIDAB in the last 72 hours. Anemia Panel: No results for input(s): VITAMINB12, FOLATE, FERRITIN, TIBC, IRON, RETICCTPCT in the last 72 hours. Urine analysis:    Component Value Date/Time   COLORURINE AMBER (A) 09/28/2017 1849   APPEARANCEUR CLEAR 09/28/2017 1849   LABSPEC 1.016 09/28/2017 1849   PHURINE 5.0 09/28/2017 1849   GLUCOSEU >=500 (A) 09/28/2017 1849   HGBUR NEGATIVE 09/28/2017 1849   BILIRUBINUR NEGATIVE 09/28/2017 1849   KETONESUR NEGATIVE 09/28/2017 1849   PROTEINUR NEGATIVE 09/28/2017 1849   UROBILINOGEN 0.2 08/29/2015 1842   NITRITE POSITIVE (A) 09/28/2017 1849   LEUKOCYTESUR MODERATE (A) 09/28/2017 1849    Radiological Exams on Admission: Dg  Chest 2 View  Result Date: 09/28/2017 CLINICAL DATA:  Cough x3 days EXAM: CHEST  2 VIEW COMPARISON:  05/19/2017 FINDINGS: Status post CABG with minimal aortic atherosclerosis. Top normal size cardiac silhouette. No pneumonic consolidation nor overt pulmonary edema. Stable pulmonary scarring in the left upper lobe. No acute nor suspicious osseous abnormalities. Degenerative changes are stable along the dorsal spine. IMPRESSION: Status post CABG. Left upper lobe scarring. No pneumonic consolidation or effusion. Electronically Signed   By: Tollie Eth M.D.   On: 09/28/2017 20:32   Echocardiogram complete 05/11/2017 ------------------------------------------------------------------- LV EF: 55% -   60%  ------------------------------------------------------------------- History:   PMH:  Stroke.  Stroke.  Risk factors:  Hypertension. Diabetes mellitus. Dyslipidemia.  ------------------------------------------------------------------- Study Conclusions  - Left ventricle: The cavity size was normal. Wall thickness was   increased in a pattern of mild LVH. Systolic function was normal.   The estimated ejection fraction was in the range of 55% to 60%.   Wall motion was normal; there were no regional wall motion   abnormalities. Doppler parameters are consistent with abnormal   left ventricular relaxation (grade 1 diastolic dysfunction).   Doppler parameters are consistent with high ventricular filling   pressure. - Mitral valve: Calcified annulus. There was mild regurgitation. - Left atrium: The atrium was mildly dilated.  Impressions:  - Normal LV systolic function; mild diastolic dysfunction; mild   LVH; sclerotic aortic valve; mild MR; mild LAE; mild TR.   EKG: Independently reviewed Vent. rate 67 BPM PR interval * ms QRS duration 101 ms QT/QTc 414/437 ms P-R-T axes 40 -51 93 Sinus rhythm Left anterior fascicular block Anterior infarct, old Minimal ST depression, lateral  leads Not significantly changed when compared to previous tracing.  Assessment/Plan Principal Problem:   COPD exacerbation (HCC) Observation/telemetry. Continue supplemental oxygen. Continue scheduled and as needed bronchodilators. Continue Solu-Medrol at 40 mg IVP every 6 hours. Please taper glucocorticoids as clinically  possible.  Active Problems:   Acute lower UTI Continue ceftriaxone 1 g IV PB every 24 hours. Follow-up urine culture and sensitivity.    CAD (coronary artery disease) Continue aspirin 325 mg by mouth daily. Currently not on statin or beta blocker.    Atypical chest pain No new EKG changes. However, given cardiac history will trend troponin level. Recheck EKG in the morning.    Diastolic dysfunction No signs of decompensation.    Hyperlipidemia Currently not on medical treatment.    Hypothyroidism Continue levothyroxine 25 g by mouth daily. TSH follow up as needed or per her primary provider.    DM type 2 causing complication (HCC) Currently uncontrolled due to recent steroid use. Carbohydrate modified diet. Overnight, time-limited IV hydration with half-normal saline. Continue NovoLog 70/30 mix 40 units SQ twice a day. CBG monitoring with regular insulin sliding scale.    Essential hypertension Continue amlodipine 5 mg by mouth daily. Continue lisinopril 10 mg by mouth daily. Monitor blood pressure, renal function and electrolytes.    Chronic back pain Continue oxycodone 10 mg by mouth every 4 hours when necessary.    OSA (obstructive sleep apnea) Not using nocturnal CPAP or BiPAP. Declined using CPAP while in the hospital.    Hyperkalemia The patient has received several albuterol treatments. She received 18 units of insulin earlier. Monitor potassium level.    Anemia Iron level was 23 mcg/dL, ferritin was 6 ng/mL and saturations ratio was 6% on 09/21/2016. She does not take iron supplementation and told the nursing staff on the  floor-she would be declining any blood products if needed. Start Iron supplementation with aggressive constipation prevention.    Constipation Secondary to medications, particularly oxycodone and amlodipine. Start Senokot twice a day before meals. Magnesium oxide 400 mg by mouth twice a day.    DVT prophylaxis: Lovenox SQ. Code Status: Full code. Family Communication:  Disposition Plan: Admit for COPD exacerbation and UTI treatment. Consults called:  Admission status: Observation/telemetry.   Bobette Mo MD [facility] Pager [phone]  If 7PM-7AM, please contact night-coverage [email or web address] Password [facility]  09/28/2017, 11:16 PM   This document was prepared using Dragon voice recognition software and may contain some unintended errors.

## 2017-09-29 DIAGNOSIS — I5189 Other ill-defined heart diseases: Secondary | ICD-10-CM | POA: Diagnosis present

## 2017-09-29 LAB — BASIC METABOLIC PANEL
Anion gap: 9 (ref 5–15)
BUN: 18 mg/dL (ref 6–20)
CALCIUM: 9.1 mg/dL (ref 8.9–10.3)
CHLORIDE: 99 mmol/L — AB (ref 101–111)
CO2: 26 mmol/L (ref 22–32)
CREATININE: 0.74 mg/dL (ref 0.44–1.00)
GFR calc non Af Amer: >60 mL/min (ref >60)
Glucose, Bld: 240 mg/dL — ABNORMAL HIGH (ref 65–99)
Potassium: 4.6 mmol/L (ref 3.5–5.1)
SODIUM: 134 mmol/L — AB (ref 135–145)

## 2017-09-29 LAB — GLUCOSE, CAPILLARY
GLUCOSE-CAPILLARY: 248 mg/dL — AB (ref 65–99)
GLUCOSE-CAPILLARY: 297 mg/dL — AB (ref 65–99)
GLUCOSE-CAPILLARY: 190 mg/dL — AB (ref 65–99)
Glucose-Capillary: 326 mg/dL — ABNORMAL HIGH (ref 65–99)

## 2017-09-29 LAB — CBC
HCT: 32.2 % — ABNORMAL LOW (ref 36.0–46.0)
HEMOGLOBIN: 8.7 g/dL — AB (ref 12.0–15.0)
MCH: 17 pg — ABNORMAL LOW (ref 26.0–34.0)
MCHC: 27 g/dL — AB (ref 30.0–36.0)
MCV: 62.9 fL — ABNORMAL LOW (ref 78.0–100.0)
Platelets: 371 10*3/uL (ref 150–400)
RBC: 5.12 MIL/uL — AB (ref 3.87–5.11)
RDW: 19.1 % — ABNORMAL HIGH (ref 11.5–15.5)
WBC: 12.1 10*3/uL — AB (ref 4.0–10.5)

## 2017-09-29 LAB — TROPONIN I

## 2017-09-29 MED ORDER — FERROUS SULFATE 325 (65 FE) MG PO TABS
325.0000 mg | ORAL_TABLET | Freq: Three times a day (TID) | ORAL | Status: DC
  Administered 2017-09-29 – 2017-10-01 (×8): 325 mg via ORAL
  Filled 2017-09-29 (×7): qty 1

## 2017-09-29 MED ORDER — ONDANSETRON HCL 4 MG PO TABS: 4.0000 mg | ORAL_TABLET | ORAL | Status: DC | PRN

## 2017-09-29 MED ORDER — SENNOSIDES-DOCUSATE SODIUM 8.6-50 MG PO TABS
1.0000 | ORAL_TABLET | Freq: Two times a day (BID) | ORAL | Status: DC
  Administered 2017-09-29 – 2017-10-01 (×5): 1 via ORAL
  Filled 2017-09-29 (×5): qty 1

## 2017-09-29 MED ORDER — MAGNESIUM OXIDE 400 (241.3 MG) MG PO TABS
400.0000 mg | ORAL_TABLET | Freq: Two times a day (BID) | ORAL | Status: DC
  Administered 2017-09-29 – 2017-10-01 (×6): 400 mg via ORAL
  Filled 2017-09-29 (×6): qty 1

## 2017-09-29 MED ORDER — IPRATROPIUM-ALBUTEROL 0.5-2.5 (3) MG/3ML IN SOLN
3.0000 mL | Freq: Four times a day (QID) | RESPIRATORY_TRACT | Status: DC
  Administered 2017-09-29 – 2017-09-30 (×5): 3 mL via RESPIRATORY_TRACT
  Filled 2017-09-29 (×5): qty 3

## 2017-09-29 MED ORDER — DEXTROSE 5 % IV SOLN
1.0000 g | INTRAVENOUS | Status: DC
  Administered 2017-09-29 – 2017-09-30 (×2): 1 g via INTRAVENOUS
  Filled 2017-09-29 (×3): qty 10

## 2017-09-29 MED ORDER — ONDANSETRON HCL 4 MG/2ML IJ SOLN
4.0000 mg | Freq: Four times a day (QID) | INTRAMUSCULAR | Status: DC | PRN
  Administered 2017-09-29: 4 mg via INTRAVENOUS

## 2017-09-29 MED ORDER — MAGNESIUM HYDROXIDE 400 MG/5ML PO SUSP
30.0000 mL | Freq: Every day | ORAL | Status: DC | PRN
  Administered 2017-09-29 – 2017-09-30 (×2): 30 mL via ORAL
  Filled 2017-09-29 (×2): qty 30

## 2017-09-29 MED ORDER — LEVALBUTEROL HCL 0.63 MG/3ML IN NEBU: 0.6300 mg | INHALATION_SOLUTION | Freq: Four times a day (QID) | RESPIRATORY_TRACT | Status: DC | PRN

## 2017-09-29 MED ORDER — INSULIN ASPART 100 UNIT/ML ~~LOC~~ SOLN
18.0000 [IU] | Freq: Once | SUBCUTANEOUS | Status: AC
  Administered 2017-09-29: 18 [IU] via SUBCUTANEOUS

## 2017-09-29 MED ORDER — ACETAMINOPHEN 325 MG PO TABS: 650.0000 mg | ORAL_TABLET | ORAL | Status: DC | PRN

## 2017-09-29 NOTE — Care Management Note (Signed)
Case Management Note  Patient Details  Name: [name] [name] MRN: [id number] Date of Birth: 01/09/1936  Subjective/Objective:                  Admitted with COPD and UTI. Pt not feeling well this AM, nauseas, no vomiting, no respiratory complaints. Pt is from home, ind with ALD's. She has PCP, drives herself to appointments. She has insurance with drug coverage and has no difficulty affording medications. She has no HH or DME needs pta. She communicates no needs or concerns regarding DC. She is not on supplemental oxygen at this time.   Action/Plan: Anticipate DC home with self care. CM will follow to DC.   Expected Discharge Date:     10/01/2017             Expected Discharge Plan:  Home/Self Care  In-House Referral:  NA  Discharge planning Services  CM Consult  Post Acute Care Choice:  NA Choice offered to:  NA  Status of Service:  In process, will continue to follow  Malcolm Metro, RN 09/29/2017, 11:17 AM

## 2017-09-29 NOTE — Progress Notes (Signed)
Initial Nutrition Assessment  DOCUMENTATION CODES:   Obesity unspecified  INTERVENTION:  Nutrition services will obtain food preferences each meal    NUTRITION DIAGNOSIS:   Altered nutrition lab value related to chronic illness (diabetes, chf) as evidenced by estimated needs (hyperglycemia).   GOAL:   (Patient will improve glycmic control and stable fluid status during hospitalization)   MONITOR:   PO intake, Labs, Weight trends  REASON FOR ASSESSMENT:   Consult COPD Protocol  ASSESSMENT: the patient is a 81 yo female with hx of dCHF, CVA, COPD, DM 2, MI and HTN. She presents with acute respiratory problem and a UTI.     Diet hx: home diet is regular. Currently she is eating 25-75% of meals today. Feeds herself and she has been ordering alternate meals. Denies chewing problems.  Unable to obtain extensive information around eating habits. Patient is wanting to go to the bathroom. Toilets independently.   Weight hx: usual body weight ranging between 190-195 lbs since late March. Prior to that she was weighing around 200 lb. No clinically significant changes.  Nutrition-Focused physical exam findings are no fat or muscle depletion, and no edema.   Labs and Meds reviewed.   Diet Order:  Diet heart healthy/carb modified Room service appropriate? Yes; Fluid consistency: Thin  Skin:  Reviewed, no issues  Last BM:  10/7  Height:   Ht Readings from Last 1 Encounters:  09/28/17  (1.626 m)    Weight:   Wt Readings from Last 1 Encounters:  09/28/17 189 lb 11.2 oz (86 kg)    Ideal Body Weight:  55 kg  BMI:  Body mass index is 32.56 kg/m.  Estimated Nutritional Needs:   Kcal:   1500-1600 (MSJ x 1.2 AF)  Protein:   80-88 gr  Fluid:   1500 ml daily  EDUCATION NEEDS:  No education needs identified at this time   Royann Shivers MS,RD,CSG,LDN Office: #[phone] Pager: [phone]

## 2017-09-29 NOTE — Progress Notes (Signed)
Inpatient Diabetes Program Recommendations  AACE/ADA: New Consensus Statement on Inpatient Glycemic Control (2015)  Target Ranges:  Prepandial:   less than 140 mg/dL      Peak postprandial:   less than 180 mg/dL (1-2 hours)      Critically ill patients:  140 - 180 mg/dL   Lab Results  Component Value Date   GLUCAP 297 (H) 09/29/2017   HGBA1C 7.8 (H) 05/11/2017    Results for CATHA, ONTKO (MRN [id number]) as of 09/29/2017 15:08  Ref. Range 09/28/2017 19:10 09/29/2017 09:18  Glucose Latest Ref Range: 65 - 99 mg/dL 147 (H) 829 (H)    Noted patient on steroids.  If BS remain elevated, please consider increasing insulin.  Mellissa Kohut RD, CDE. M.Ed. Pager [phone] Inpatient Diabetes Coordinator

## 2017-09-29 NOTE — Care Management Obs Status (Signed)
MEDICARE OBSERVATION STATUS NOTIFICATION   Patient Details  Name: [name] [name] MRN: [id number] Date of Birth: 1936-04-14   Medicare Observation Status Notification Given:  Yes    Malcolm Metro, RN 09/29/2017, 10:25 AM

## 2017-09-29 NOTE — Progress Notes (Signed)
Patient admitted with significant bronchospasm which is not very responsive to oral steroids and home nebulizer therapy currently on IV Solu-Medrol likewise has concomitant UTI currently on Rocephin IV patient coughing feels dyspneic fearful 1 coughing [name] [name] WJX:[id number] DOB: 08-15-1936 DOA: 09/28/2017 PCP: [name] Linsey, MD   Physical Exam: Blood pressure 123/61, pulse 81, temperature 97.7 F (36.5 C), temperature source Oral, resp. rate 17, height  (1.626 [name]), weight 86 kg (189 lb 11.2 oz), SpO2 96 %. Patient has moderate inspiratory wheeze scattered rhonchi diminished breath sounds in the bases heart regular rhythm no S3 or S4 no heaves thrills rubs   Investigations:  No results found for this or any previous visit (from the past 240 hour(s)).   Basic Metabolic Panel:  Recent Labs  21/30/86 1910 09/29/17 0918  NA 134* 134*  K 5.3* 4.6  CL 100* 99*  CO2 25 26  GLUCOSE 296* 240*  BUN 20 18  CREATININE 0.85 0.74  CALCIUM 9.0 9.1  MG 2.1  --    Liver Function Tests:  Recent Labs  09/28/17 1910  AST 24  ALT 14  ALKPHOS 74  BILITOT 0.5  PROT 6.6  ALBUMIN 3.8     CBC:  Recent Labs  09/28/17 1910 09/29/17 0918  WBC 13.6* 12.1*  NEUTROABS 7.2  --   HGB 7.9* 8.7*  HCT 29.0* 32.2*  MCV 62.2* 62.9*  PLT 346 371    Dg Chest 2 View  Result Date: 09/28/2017 CLINICAL DATA:  Cough x3 days EXAM: CHEST  2 VIEW COMPARISON:  05/19/2017 FINDINGS: Status post CABG with minimal aortic atherosclerosis. Top normal size cardiac silhouette. No pneumonic consolidation nor overt pulmonary edema. Stable pulmonary scarring in the left upper lobe. No acute nor suspicious osseous abnormalities. Degenerative changes are stable along the dorsal spine. IMPRESSION: Status post CABG. Left upper lobe scarring. No pneumonic consolidation or effusion. Electronically Signed   By: Tollie Eth [name].D.   On: 09/28/2017 20:32      Medications:   Impression:  Principal  Problem:   COPD exacerbation (HCC) Active Problems:   Hypothyroidism   DM type 2 causing complication (HCC)   Hyperlipidemia   Essential hypertension   Chronic back pain   OSA (obstructive sleep apnea)   CAD (coronary artery disease)   Acute lower UTI   Hyperkalemia   Anemia   Atypical chest pain   Diastolic dysfunction     Plan: Continue IV Solu-Medrol continue DuoNeb nebulizer 4 times a day with Xopenex added for acute events continue IV Rocephin physical therapy for strengthening and ambulation. Pulmonary consultation requested for tomorrow Tuesday  Consultants: Pulmonary requested   Procedures   Antibiotics: Okay IV Rocephin          Time spent:   LOS: 0 days   [name] [name]   09/29/2017, 12:38 PM

## 2017-09-30 LAB — GLUCOSE, CAPILLARY
GLUCOSE-CAPILLARY: 263 mg/dL — AB (ref 65–99)
GLUCOSE-CAPILLARY: 324 mg/dL — AB (ref 65–99)
Glucose-Capillary: 230 mg/dL — ABNORMAL HIGH (ref 65–99)
Glucose-Capillary: 120 mg/dL — ABNORMAL HIGH (ref 65–99)

## 2017-09-30 LAB — RETICULOCYTES
RBC.: 4.86 MIL/uL (ref 3.87–5.11)
RETIC CT PCT: 2.7 % (ref 0.4–3.1)
Retic Count, Absolute: 131.2 10*3/uL (ref 19.0–186.0)

## 2017-09-30 LAB — URINE CULTURE: SPECIAL REQUESTS: NORMAL

## 2017-09-30 MED ORDER — OXYCODONE HCL 5 MG PO TABS: 5.0000 mg | ORAL_TABLET | ORAL | Status: DC | PRN

## 2017-09-30 MED ORDER — IPRATROPIUM-ALBUTEROL 0.5-2.5 (3) MG/3ML IN SOLN
3.0000 mL | Freq: Three times a day (TID) | RESPIRATORY_TRACT | Status: DC
  Administered 2017-09-30 – 2017-10-01 (×3): 3 mL via RESPIRATORY_TRACT
  Filled 2017-09-30 (×3): qty 3

## 2017-09-30 MED ORDER — OXYCODONE HCL 5 MG PO TABS
10.0000 mg | ORAL_TABLET | ORAL | Status: DC | PRN
  Administered 2017-09-30 – 2017-10-01 (×4): 10 mg via ORAL
  Filled 2017-09-30 (×4): qty 2

## 2017-09-30 MED ORDER — METHYLPREDNISOLONE SODIUM SUCC 40 MG IJ SOLR
40.0000 mg | Freq: Two times a day (BID) | INTRAMUSCULAR | Status: DC
  Administered 2017-09-30 – 2017-10-01 (×2): 40 mg via INTRAVENOUS
  Filled 2017-09-30 (×2): qty 1

## 2017-09-30 MED ORDER — NALOXEGOL OXALATE 25 MG PO TABS
25.0000 mg | ORAL_TABLET | Freq: Every day | ORAL | Status: DC
  Administered 2017-09-30 – 2017-10-01 (×2): 25 mg via ORAL
  Filled 2017-09-30 (×4): qty 1

## 2017-09-30 MED ORDER — INSULIN ASPART PROT & ASPART (70-30 MIX) 100 UNIT/ML ~~LOC~~ SUSP
40.0000 [IU] | Freq: Two times a day (BID) | SUBCUTANEOUS | Status: DC
  Administered 2017-09-30 – 2017-10-01 (×2): 40 [IU] via SUBCUTANEOUS
  Filled 2017-09-30: qty 10

## 2017-09-30 MED ORDER — FLUTICASONE FUROATE-VILANTEROL 100-25 MCG/INH IN AEPB
1.0000 | INHALATION_SPRAY | Freq: Every day | RESPIRATORY_TRACT | Status: DC
  Administered 2017-09-30 – 2017-10-01 (×2): 1 via RESPIRATORY_TRACT
  Filled 2017-09-30: qty 28

## 2017-09-30 NOTE — Progress Notes (Signed)
Inpatient Diabetes Program Recommendations  AACE/ADA: New Consensus Statement on Inpatient Glycemic Control (2015)  Target Ranges:  Prepandial:   less than 140 mg/dL      Peak postprandial:   less than 180 mg/dL (1-2 hours)      Critically ill patients:  140 - 180 mg/dL   Results for PAKOU, RAINBOW (MRN [id number]) as of 09/30/2017 10:08  Ref. Range 09/29/2017 08:18 09/29/2017 11:13 09/29/2017 16:45 09/29/2017 22:33 09/30/2017 07:36  Glucose-Capillary Latest Ref Range: 65 - 99 mg/dL 782 (H) 956 (H) 213 (H) 326 (H) 263 (H)   Review of Glycemic Control  Diabetes history: DM2 Outpatient Diabetes medications: 70/30 40 units BID, Metformin 500 mg BID Current orders for Inpatient glycemic control: 70/30 40 units BID, Novolog 0-15 units TID with meals, Metformin 500 mg BID  Inpatient Diabetes Program Recommendations: Insulin - Basal: If steroids are continued, please consider increasing 70/30 to 43 units BID. Correction (SSI): Please consider ordering Novolog 0-5 units QHS for bedtime correction.  NOTE: Patient is ordered Solumedrol 40 mg Q6H which is contributing to hyperglycemia.   Thanks, Orlando Penner, RN, MSN, CDE Diabetes Coordinator Inpatient Diabetes Program [phone] (Team Pager from 8am to 5pm)

## 2017-09-30 NOTE — Consult Note (Addendum)
Consult requested by: Dr. Delbert Harness Consult requested for: COPD exacerbation/respiratory failure  HPI: This is a 81 year old with known COPD and multiple other medical problems including arthritis of multiple joints, coronary disease status post bypass grafting, obstructive sleep apnea noncompliant with CPAP, hypertension, diastolic heart failure, diabetes on insulin, diabetic peripheral neuropathy. She came to the emergency department because she was having chills urinary frequency shortness of breath wheezing and she had a cough and was coughing up yellowish sputum. She had seen Dr. Delbert Harness in the office and she was given medications but it did not help. She says she uses a nebulizer at home but is not on oxygen. Her chest pain is pleuritic. She's not having any abdominal pain diarrhea nausea vomiting discolored stool no hematuria now although she's had that in the past no dizziness no palpitations. She says her blood sugar has been up. She feels better now.  Past Medical History:  Diagnosis Date  . Arthritis   . CAD (coronary artery disease)    Multivessel status post CABG 2001  . Chronic obstructive pulmonary disease (HCC)   . DDD (degenerative disc disease), lumbar   . Diastolic CHF (HCC)   . Essential hypertension   . Hematuria   . History of CVA (cerebrovascular accident)    2013 - lacunar infarct right thalamus - residual left side of  lip numb  and left eye vision worse (which corrented lens implant from cataract extraction)  . History of MI (myocardial infarction)    July 2001  . Hyperlipidemia   . Hypothyroidism   . Insulin dependent type 2 diabetes mellitus (HCC)   . Lesion of bladder   . Lower urinary tract symptoms (LUTS)   . OSA (obstructive sleep apnea)   . Peripheral neuropathy   . Psoriasis   . SUI (stress urinary incontinence, female)   . Varicose veins      Family History  Problem Relation Age of Onset  . Hypertension Mother   . Stroke Mother   . Coronary  artery disease Unknown        Multiple first and second-degree relatives  . Aneurysm Unknown        Cerebral circulation  . Colon cancer Sister   . Stroke Sister   . Coronary artery disease Sister   . Clotting disorder Unknown        Children diagnosed with hypercoagulable state  . Coronary artery disease Brother      Social History   Social History  . Marital status: Married    Spouse name: N/A  . Number of children: 2  . Years of education: N/A   Occupational History  .  Retired   Social History Main Topics  . Smoking status: Never Smoker  . Smokeless tobacco: Never Used  . Alcohol use No  . Drug use: No  . Sexual activity: No   Other Topics Concern  . None   Social History Narrative  . None     ROS: Except as mentioned 10 point review of systems is negative    Objective: Vital signs in last 24 hours: Temp:  [98 F (36.7 C)-98.4 F (36.9 C)] 98.4 F (36.9 C) (10/08 2100) Pulse Rate:  [80-86] 80 (10/08 2100) Resp:  [17-20] 17 (10/08 2100) BP: (114-127)/(56-69) 114/56 (10/08 2100) SpO2:  [88 %-98 %] 97 % (10/08 2100) Weight change:  Last BM Date: 09/28/17  Intake/Output from previous day: 10/08 0701 - 10/09 0700 In: 600 [P.O.:600] Out: 1100 [Urine:1100]  PHYSICAL EXAM  Constitutional: She is awake and alert and in no acute distress. She is obese. Eyes: Pupils react. EOMI. Ears nose mouth and throat: Her mucous membranes are moist. Hearing is grossly normal. Throat is clear. Cardiovascular: Her heart is regular with normal heart sounds. No edema. Respiratory: Her respiratory effort is normal. Her lungs show minimal wheezing now. She is wearing nasal oxygen. Gastrointestinal: Her abdomen is soft with no masses. Skin: Warm and dry. Musculoskeletal: Muscle strength is normal in the upper and lower extremities bilaterally. Neurological: No focal abnormalities. Psychiatric: Normal mood and affect.  Lab Results: Basic Metabolic Panel:  Recent Labs   09/28/17 1910 09/29/17 0918  NA 134* 134*  K 5.3* 4.6  CL 100* 99*  CO2 25 26  GLUCOSE 296* 240*  BUN 20 18  CREATININE 0.85 0.74  CALCIUM 9.0 9.1  MG 2.1  --    Liver Function Tests:  Recent Labs  09/28/17 1910  AST 24  ALT 14  ALKPHOS 74  BILITOT 0.5  PROT 6.6  ALBUMIN 3.8   No results for input(s): LIPASE, AMYLASE in the last 72 hours. No results for input(s): AMMONIA in the last 72 hours. CBC:  Recent Labs  09/28/17 1910 09/29/17 0918  WBC 13.6* 12.1*  NEUTROABS 7.2  --   HGB 7.9* 8.7*  HCT 29.0* 32.2*  MCV 62.2* 62.9*  PLT 346 371   Cardiac Enzymes:  Recent Labs  09/28/17 1910 09/29/17 0406 09/29/17 0918  TROPONINI <0.03 <0.03 <0.03   BNP: No results for input(s): PROBNP in the last 72 hours. D-Dimer: No results for input(s): DDIMER in the last 72 hours. CBG:  Recent Labs  09/28/17 2345 09/29/17 0818 09/29/17 1113 09/29/17 1645 09/29/17 2233 09/30/17 0736  GLUCAP 433* 248* 297* 190* 326* 263*   Hemoglobin A1C: No results for input(s): HGBA1C in the last 72 hours. Fasting Lipid Panel: No results for input(s): CHOL, HDL, LDLCALC, TRIG, CHOLHDL, LDLDIRECT in the last 72 hours. Thyroid Function Tests: No results for input(s): TSH, T4TOTAL, FREET4, T3FREE, THYROIDAB in the last 72 hours. Anemia Panel: No results for input(s): VITAMINB12, FOLATE, FERRITIN, TIBC, IRON, RETICCTPCT in the last 72 hours. Coagulation: No results for input(s): LABPROT, INR in the last 72 hours. Urine Drug Screen: Drugs of Abuse     Component Value Date/Time   LABOPIA NONE DETECTED 09/19/2016 1640   COCAINSCRNUR NONE DETECTED 09/19/2016 1640   LABBENZ NONE DETECTED 09/19/2016 1640   AMPHETMU NONE DETECTED 09/19/2016 1640   THCU NONE DETECTED 09/19/2016 1640   LABBARB NONE DETECTED 09/19/2016 1640    Alcohol Level: No results for input(s): ETH in the last 72 hours. Urinalysis:  Recent Labs  09/28/17 1849  COLORURINE AMBER*  LABSPEC 1.016  PHURINE  5.0  GLUCOSEU >=500*  HGBUR NEGATIVE  BILIRUBINUR NEGATIVE  KETONESUR NEGATIVE  PROTEINUR NEGATIVE  NITRITE POSITIVE*  LEUKOCYTESUR MODERATE*   Misc. Labs:   ABGS: No results for input(s): PHART, PO2ART, TCO2, HCO3 in the last 72 hours.  Invalid input(s): PCO2   MICROBIOLOGY: No results found for this or any previous visit (from the past 240 hour(s)).  Studies/Results: Dg Chest 2 View  Result Date: 09/28/2017 CLINICAL DATA:  Cough x3 days EXAM: CHEST  2 VIEW COMPARISON:  05/19/2017 FINDINGS: Status post CABG with minimal aortic atherosclerosis. Top normal size cardiac silhouette. No pneumonic consolidation nor overt pulmonary edema. Stable pulmonary scarring in the left upper lobe. No acute nor suspicious osseous abnormalities. Degenerative changes are stable along the dorsal spine. IMPRESSION: Status post  CABG. Left upper lobe scarring. No pneumonic consolidation or effusion. Electronically Signed   By: Tollie Eth M.D.   On: 09/28/2017 20:32    Medications:  Prior to Admission:  Prescriptions Prior to Admission  Medication Sig Dispense Refill Last Dose  . amLODipine (NORVASC) 5 MG tablet Take 5 mg by mouth daily.  5 09/28/2017 at 0700  . aspirin EC 325 MG tablet Take 325 mg by mouth daily.   09/28/2017 at 0700  . cyclobenzaprine (FLEXERIL) 5 MG tablet Take 1 tablet (5 mg total) by mouth 2 (two) times daily as needed for muscle spasms. 10 tablet 0 09/28/2017 at 0700  . gabapentin (NEURONTIN) 300 MG capsule Take 300 mg by mouth 2 (two) times daily.  2 09/28/2017 at 0700  . guaiFENesin (ROBITUSSIN) 100 MG/5ML SOLN Take 10 mLs (200 mg total) by mouth every 6 (six) hours as needed for cough or to loosen phlegm. 1200 mL 0 Past Week at Unknown time  . HYDROcodone-acetaminophen (NORCO) 5-325 MG tablet Take 1-2 tablets by mouth every 4 (four) hours as needed (do not take with your home norco). 10 tablet 0 Past Week at Unknown time  . insulin NPH-regular Human (NOVOLIN 70/30) (70-30) 100  UNIT/ML injection Inject 40 Units into the skin 2 (two) times daily.   09/28/2017 at 0700  . ipratropium-albuterol (DUONEB) 0.5-2.5 (3) MG/3ML SOLN Inhale 3 mLs into the lungs 4 (four) times daily as needed.  5 09/28/2017 at 1200  . levothyroxine (SYNTHROID, LEVOTHROID) 25 MCG tablet Take 25 mcg by mouth daily.    09/28/2017 at 0700  . lisinopril (PRINIVIL,ZESTRIL) 10 MG tablet Take 10 mg by mouth daily.   09/28/2017 at 0700  . LORazepam (ATIVAN) 2 MG tablet Take 2 mg by mouth at bedtime as needed for sleep.    09/27/2017 at Unknown time  . metFORMIN (GLUCOPHAGE) 500 MG tablet Take 500 mg by mouth 2 (two) times daily with a meal.     09/28/2017 at 0700  . OVER THE COUNTER MEDICATION Place 1 drop into both eyes 3 (three) times daily as needed (eye drops). OTC lubricating eye drops   Past Week at Unknown time  . OVER THE COUNTER MEDICATION Apply 1 application topically 2 (two) times daily as needed (itching/psoriasis flares). OTC Psoriasis cream   Past Week at Unknown time  . Oxycodone HCl 10 MG TABS Take 10 mg by mouth every 4 (four) hours.  0 09/28/2017 at 0700  . predniSONE (DELTASONE) 20 MG tablet Take 20 mg by mouth 4 (four) times daily.   09/28/2017 at 1200  . VITAMIN D, ERGOCALCIFEROL, PO Take 1 capsule by mouth daily.   09/28/2017 at 0700  . nitroGLYCERIN (NITROSTAT) 0.4 MG SL tablet Place 0.4 mg under the tongue every 5 (five) minutes as needed for chest pain.    Not Taking at Unknown time   Scheduled: . amLODipine  5 mg Oral Daily  . aspirin EC  325 mg Oral Daily  . enoxaparin (LOVENOX) injection  40 mg Subcutaneous Q24H  . ferrous sulfate  325 mg Oral TID WC  . gabapentin  300 mg Oral BID  . insulin aspart  0-15 Units Subcutaneous TID WC  . insulin aspart protamine- aspart  40 Units Subcutaneous BID  . ipratropium-albuterol  3 mL Nebulization QID  . levothyroxine  25 mcg Oral QAC breakfast  . lisinopril  10 mg Oral Daily  . magnesium oxide  400 mg Oral BID  . metFORMIN  500 mg Oral BID WC   .  methylPREDNISolone (SOLU-MEDROL) injection  40 mg Intravenous Q6H  . senna-docusate  1 tablet Oral BID AC   Continuous: . cefTRIAXone (ROCEPHIN)  IV Stopped (09/29/17 2308)   ZOX:[id number], albuterol, cyclobenzaprine, levalbuterol, LORazepam, magnesium hydroxide, nitroGLYCERIN, ondansetron **OR** ondansetron (ZOFRAN) IV  Assesment: She was admitted with COPD exacerbation. She is improving. She has sleep apnea but she is not using CPAP. She feels like she needs maintenance medication and I think that's reasonable. Since she uses DuoNeb at home she would not be a candidate for an inhaled anticholinergic but she could be put on something like Symbicort and I'm going to go ahead and start that. Principal Problem:   COPD exacerbation (HCC) Active Problems:   Hypothyroidism   DM type 2 causing complication (HCC)   Hyperlipidemia   Essential hypertension   Chronic back pain   OSA (obstructive sleep apnea)   CAD (coronary artery disease)   Acute lower UTI   Hyperkalemia   Anemia   Atypical chest pain   Diastolic dysfunction    Plan: Continue current treatments. Add Symbicort. No other new treatments. Our formulary has Brio instead of Symbicort so I added that. Thanks for allowing me to see her with you    LOS: 0 days   Jasslyn Finkel L 09/30/2017, 8:27 AM

## 2017-09-30 NOTE — Progress Notes (Signed)
Pt awaken very confused and anxious. Pt got up out of bed. Bed alarm sounded. RN reoriented patient and calmed her down. Pt is back in bed, but still confused.

## 2017-09-30 NOTE — Progress Notes (Signed)
Patient has multiple medical issues including iron deficiency anemia hemoglobin improved from 7.9 day 0.7. Asthmatic bronchitis with minimal inspiratory next artery wheeze today on IV Solu-Medrol. Constipation which I believe is OIC. Patient had colonoscopy 4 years ago showing diverticular bleed. Patient likewise has multiple somatic complaints appears quite anxious. [name] [name] ZOX:[id number] DOB: 03-Aug-1936 DOA: 09/28/2017 PCP: Oval Linsey, MD   Physical Exam: Blood pressure (!) 114/56, pulse 80, temperature 98.4 F (36.9 C), resp. rate 17, height  (1.626 [name]), weight 86 kg (189 lb 11.2 oz), SpO2 92 %. Lungs minimal his return next 3 wheezes scattered rhonchi no rales audible heart regular rhythm no S3-S4 no heaves thrills rubs abdomen soft nontender bowel sounds normoactive no guarding or rebound or masses no megaly   Investigations:  No results found for this or any previous visit (from the past 240 hour(s)).   Basic Metabolic Panel:  Recent Labs  81/19/14 1910 09/29/17 0918  NA 134* 134*  K 5.3* 4.6  CL 100* 99*  CO2 25 26  GLUCOSE 296* 240*  BUN 20 18  CREATININE 0.85 0.74  CALCIUM 9.0 9.1  MG 2.1  --    Liver Function Tests:  Recent Labs  09/28/17 1910  AST 24  ALT 14  ALKPHOS 74  BILITOT 0.5  PROT 6.6  ALBUMIN 3.8     CBC:  Recent Labs  09/28/17 1910 09/29/17 0918  WBC 13.6* 12.1*  NEUTROABS 7.2  --   HGB 7.9* 8.7*  HCT 29.0* 32.2*  MCV 62.2* 62.9*  PLT 346 371    Dg Chest 2 View  Result Date: 09/28/2017 CLINICAL DATA:  Cough x3 days EXAM: CHEST  2 VIEW COMPARISON:  05/19/2017 FINDINGS: Status post CABG with minimal aortic atherosclerosis. Top normal size cardiac silhouette. No pneumonic consolidation nor overt pulmonary edema. Stable pulmonary scarring in the left upper lobe. No acute nor suspicious osseous abnormalities. Degenerative changes are stable along the dorsal spine. IMPRESSION: Status post CABG. Left upper lobe scarring. No  pneumonic consolidation or effusion. Electronically Signed   By: Tollie Eth [name].D.   On: 09/28/2017 20:32      Medications:   Impression:  Principal Problem:   COPD exacerbation (HCC) Active Problems:   Hypothyroidism   DM type 2 causing complication (HCC)   Hyperlipidemia   Essential hypertension   Chronic back pain   OSA (obstructive sleep apnea)   CAD (coronary artery disease)   Acute lower UTI   Hyperkalemia   Anemia   Atypical chest pain   Diastolic dysfunction     Plan: Multidetector 25 mg by mouth now and daily daily 40 IC. Reduce IV Solu-Medrol to 40 every 12 in hopes of discharge in a[name]. Anemia profile today  Consultants: Pulmonary    Procedures   Antibiotics: Rocephin 1 g IV every 24 hours           Time spent: 30 minutes   LOS: 0 days   [name] [name]   09/30/2017, 1:51 PM

## 2017-10-01 LAB — IRON AND TIBC
IRON: 14 ug/dL — AB (ref 28–170)
Saturation Ratios: 4 % — ABNORMAL LOW (ref 10.4–31.8)
TIBC: 354 ug/dL (ref 250–450)
UIBC: 340 ug/dL

## 2017-10-01 LAB — GLUCOSE, CAPILLARY
GLUCOSE-CAPILLARY: 208 mg/dL — AB (ref 65–99)
Glucose-Capillary: 228 mg/dL — ABNORMAL HIGH (ref 65–99)

## 2017-10-01 LAB — VITAMIN B12: VITAMIN B 12: 471 pg/mL (ref 180–914)

## 2017-10-01 LAB — FERRITIN: FERRITIN: 8 ng/mL — AB (ref 11–307)

## 2017-10-01 LAB — FOLATE: Folate: 15.5 ng/mL (ref >5.9)

## 2017-10-01 MED ORDER — FERROUS SULFATE 325 (65 FE) MG PO TABS: 325.0000 mg | ORAL_TABLET | Freq: Three times a day (TID) | ORAL | 3 refills | Status: DC

## 2017-10-01 MED ORDER — PREDNISONE 20 MG PO TABS: 20.0000 mg | ORAL_TABLET | Freq: Two times a day (BID) | ORAL | 2 refills | Status: AC

## 2017-10-01 MED ORDER — CIPROFLOXACIN HCL 250 MG PO TABS: 500.0000 mg | ORAL_TABLET | Freq: Two times a day (BID) | ORAL | Status: DC

## 2017-10-01 MED ORDER — FLUTICASONE FUROATE-VILANTEROL 100-25 MCG/INH IN AEPB: 1.0000 | INHALATION_SPRAY | Freq: Every day | RESPIRATORY_TRACT | 3 refills | Status: DC

## 2017-10-01 MED ORDER — CIPROFLOXACIN HCL 500 MG PO TABS: 500.0000 mg | ORAL_TABLET | Freq: Two times a day (BID) | ORAL | 0 refills | Status: DC

## 2017-10-01 NOTE — Care Management Note (Signed)
Case Management Note  Patient Details  Name: DARCELL YACOUB MRN: [id number] Date of Birth: 1936-02-03  Expected Discharge Date:  10/01/17               Expected Discharge Plan:  Home/Self Care  In-House Referral:  NA  Discharge planning Services  CM Consult  Post Acute Care Choice:  NA Choice offered to:  NA  Status of Service:  Completed, signed off   Additional Comments: discharging home today with self care. No CM needs noted at DC.   Malcolm Metro, RN 10/01/2017, 1:21 PM

## 2017-10-01 NOTE — Progress Notes (Signed)
Subjective: She says she feels better. She had a nightmare last night and she says that it was related to Ambien but I don't see Ambien listed on her medications. She feels like the Breo helped. She still complains of urinary symptoms and says that she's had innumerable urinary tract infections in the last several years. She says that she thinks she may be able to go home today  Objective: Vital signs in last 24 hours: Temp:  [98 F (36.7 C)-98.9 F (37.2 C)] 98.5 F (36.9 C) (10/10 0547) Pulse Rate:  [71-88] 71 (10/10 0547) Resp:  [17-20] 17 (10/10 0547) BP: (119-141)/(45-87) 141/87 (10/10 0547) SpO2:  [92 %-96 %] 93 % (10/10 0547) Weight change:  Last BM Date: 09/28/17  Intake/Output from previous day: 10/09 0701 - 10/10 0700 In: 1510 [P.O.:1510] Out: 1050 [Urine:1050]  PHYSICAL EXAM General appearance: alert, cooperative and no distress Resp: She has bilaterally decreased breath sounds and prolonged expiratory phase but no wheezes or rhonchi Cardio: regular rate and rhythm, S1, S2 normal, no murmur, click, rub or gallop GI: soft, non-tender; bowel sounds normal; no masses,  no organomegaly Extremities: extremities normal, atraumatic, no cyanosis or edema Skin warm and dry. More alert than yesterday  Lab Results:  Results for orders placed or performed during the hospital encounter of 09/28/17 (from the past 48 hour(s))  Glucose, capillary     Status: Abnormal   Collection Time: 09/29/17  8:18 AM  Result Value Ref Range   Glucose-Capillary 248 (H) 65 - 99 mg/dL  Troponin I-serum (0, 3, 6 hours)     Status: None   Collection Time: 09/29/17  9:18 AM  Result Value Ref Range   Troponin I <0.03 <0.03 ng/mL  CBC     Status: Abnormal   Collection Time: 09/29/17  9:18 AM  Result Value Ref Range   WBC 12.1 (H) 4.0 - 10.5 K/uL   RBC 5.12 (H) 3.87 - 5.11 MIL/uL   Hemoglobin 8.7 (L) 12.0 - 15.0 g/dL   HCT 32.2 (L) 36.0 - 46.0 %   MCV 62.9 (L) 78.0 - 100.0 fL    Comment:  MICROCYTES SCHISTOCYTES NOTED ON SMEAR    MCH 17.0 (L) 26.0 - 34.0 pg   MCHC 27.0 (L) 30.0 - 36.0 g/dL   RDW 19.1 (H) 11.5 - 15.5 %   Platelets 371 150 - 400 K/uL    Comment: SPECIMEN CHECKED FOR CLOTS PLATELET COUNT CONFIRMED BY SMEAR GIANT PLATELETS SEEN   Basic metabolic panel     Status: Abnormal   Collection Time: 09/29/17  9:18 AM  Result Value Ref Range   Sodium 134 (L) 135 - 145 mmol/L   Potassium 4.6 3.5 - 5.1 mmol/L   Chloride 99 (L) 101 - 111 mmol/L   CO2 26 22 - 32 mmol/L   Glucose, Bld 240 (H) 65 - 99 mg/dL   BUN 18 6 - 20 mg/dL   Creatinine, Ser 0.74 0.44 - 1.00 mg/dL   Calcium 9.1 8.9 - 10.3 mg/dL   GFR calc non Af Amer >60 >60 mL/min   GFR calc Af Amer >60 >60 mL/min    Comment: (NOTE) The eGFR has been calculated using the CKD EPI equation. This calculation has not been validated in all clinical situations. eGFR's persistently <60 mL/min signify possible Chronic Kidney Disease.    Anion gap 9 5 - 15  Glucose, capillary     Status: Abnormal   Collection Time: 09/29/17 11:13 AM  Result Value Ref Range  Glucose-Capillary 297 (H) 65 - 99 mg/dL  Glucose, capillary     Status: Abnormal   Collection Time: 09/29/17  4:45 PM  Result Value Ref Range   Glucose-Capillary 190 (H) 65 - 99 mg/dL  Glucose, capillary     Status: Abnormal   Collection Time: 09/29/17 10:33 PM  Result Value Ref Range   Glucose-Capillary 326 (H) 65 - 99 mg/dL  Glucose, capillary     Status: Abnormal   Collection Time: 09/30/17  7:36 AM  Result Value Ref Range   Glucose-Capillary 263 (H) 65 - 99 mg/dL  Glucose, capillary     Status: Abnormal   Collection Time: 09/30/17 11:36 AM  Result Value Ref Range   Glucose-Capillary 324 (H) 65 - 99 mg/dL  Vitamin B12     Status: None   Collection Time: 09/30/17  2:13 PM  Result Value Ref Range   Vitamin B-12 471 180 - 914 pg/mL    Comment: (NOTE) This assay is not validated for testing neonatal or myeloproliferative syndrome specimens for  Vitamin B12 levels. Performed at [facility], [place] [address], [place], [place] [address]   Folate     Status: None   Collection Time: 09/30/17  2:13 PM  Result Value Ref Range   Folate 15.5 >5.9 ng/mL    Comment: Performed at [facility] [address], [place], [place] [address]  Iron and TIBC     Status: Abnormal   Collection Time: 09/30/17  2:13 PM  Result Value Ref Range   Iron 14 (L) 28 - 170 ug/dL   TIBC 354 250 - 450 ug/dL   Saturation Ratios 4 (L) 10.4 - 31.8 %   UIBC 340 ug/dL    Comment: Performed at [facility], [place] [address], [place], [place] [address]  Ferritin     Status: Abnormal   Collection Time: 09/30/17  2:13 PM  Result Value Ref Range   Ferritin 8 (L) 11 - 307 ng/mL    Comment: Performed at [facility], [place] [address], [place], [place] [address]  Reticulocytes     Status: None   Collection Time: 09/30/17  2:13 PM  Result Value Ref Range   Retic Ct Pct 2.7 0.4 - 3.1 %   RBC. 4.86 3.87 - 5.11 MIL/uL   Retic Count, Absolute 131.2 19.0 - 186.0 K/uL  Glucose, capillary     Status: Abnormal   Collection Time: 09/30/17  4:13 PM  Result Value Ref Range   Glucose-Capillary 230 (H) 65 - 99 mg/dL   Comment 1 Notify RN    Comment 2 Document in Chart   Glucose, capillary     Status: Abnormal   Collection Time: 09/30/17  8:50 PM  Result Value Ref Range   Glucose-Capillary 120 (H) 65 - 99 mg/dL   Comment 1 Notify RN    Comment 2 Document in Chart   Glucose, capillary     Status: Abnormal   Collection Time: 10/01/17  7:46 AM  Result Value Ref Range   Glucose-Capillary 228 (H) 65 - 99 mg/dL   Comment 1 Notify RN    Comment 2 Document in Chart     ABGS No results for input(s): PHART, PO2ART, TCO2, HCO3 in the last 72 hours.  Invalid input(s): PCO2 CULTURES Recent Results (from the past 240 hour(s))  Urine culture     Status: Abnormal   Collection Time: 09/28/17  6:00 PM  Result Value Ref Range Status  Specimen Description  URINE, CLEAN CATCH  Final   Special Requests Normal  Final   Culture MULTIPLE SPECIES PRESENT, SUGGEST RECOLLECTION (A)  Final   Report Status 09/30/2017 FINAL  Final   Studies/Results: No results found.  Medications:  Prior to Admission:  Prescriptions Prior to Admission  Medication Sig Dispense Refill Last Dose  . amLODipine (NORVASC) 5 MG tablet Take 5 mg by mouth daily.  5 09/28/2017 at 0700  . aspirin EC 325 MG tablet Take 325 mg by mouth daily.   09/28/2017 at 0700  . cyclobenzaprine (FLEXERIL) 5 MG tablet Take 1 tablet (5 mg total) by mouth 2 (two) times daily as needed for muscle spasms. 10 tablet 0 09/28/2017 at 0700  . gabapentin (NEURONTIN) 300 MG capsule Take 300 mg by mouth 2 (two) times daily.  2 09/28/2017 at 0700  . guaiFENesin (ROBITUSSIN) 100 MG/5ML SOLN Take 10 mLs (200 mg total) by mouth every 6 (six) hours as needed for cough or to loosen phlegm. 1200 mL 0 Past Week at Unknown time  . HYDROcodone-acetaminophen (NORCO) 5-325 MG tablet Take 1-2 tablets by mouth every 4 (four) hours as needed (do not take with your home norco). 10 tablet 0 Past Week at Unknown time  . insulin NPH-regular Human (NOVOLIN 70/30) (70-30) 100 UNIT/ML injection Inject 40 Units into the skin 2 (two) times daily.   09/28/2017 at 0700  . ipratropium-albuterol (DUONEB) 0.5-2.5 (3) MG/3ML SOLN Inhale 3 mLs into the lungs 4 (four) times daily as needed.  5 09/28/2017 at 1200  . levothyroxine (SYNTHROID, LEVOTHROID) 25 MCG tablet Take 25 mcg by mouth daily.    09/28/2017 at 0700  . lisinopril (PRINIVIL,ZESTRIL) 10 MG tablet Take 10 mg by mouth daily.   09/28/2017 at 0700  . LORazepam (ATIVAN) 2 MG tablet Take 2 mg by mouth at bedtime as needed for sleep.    09/27/2017 at Unknown time  . metFORMIN (GLUCOPHAGE) 500 MG tablet Take 500 mg by mouth 2 (two) times daily with a meal.     09/28/2017 at 0700  . OVER THE COUNTER MEDICATION Place 1 drop into both eyes 3 (three) times daily as needed (eye drops). OTC  lubricating eye drops   Past Week at Unknown time  . OVER THE COUNTER MEDICATION Apply 1 application topically 2 (two) times daily as needed (itching/psoriasis flares). OTC Psoriasis cream   Past Week at Unknown time  . Oxycodone HCl 10 MG TABS Take 10 mg by mouth every 4 (four) hours.  0 09/28/2017 at 0700  . predniSONE (DELTASONE) 20 MG tablet Take 20 mg by mouth 4 (four) times daily.   09/28/2017 at 1200  . VITAMIN D, ERGOCALCIFEROL, PO Take 1 capsule by mouth daily.   09/28/2017 at 0700  . nitroGLYCERIN (NITROSTAT) 0.4 MG SL tablet Place 0.4 mg under the tongue every 5 (five) minutes as needed for chest pain.    Not Taking at Unknown time   Scheduled: . amLODipine  5 mg Oral Daily  . aspirin EC  325 mg Oral Daily  . enoxaparin (LOVENOX) injection  40 mg Subcutaneous Q24H  . ferrous sulfate  325 mg Oral TID WC  . fluticasone furoate-vilanterol  1 puff Inhalation Daily  . gabapentin  300 mg Oral BID  . insulin aspart  0-15 Units Subcutaneous TID WC  . insulin aspart protamine- aspart  40 Units Subcutaneous BID WC  . ipratropium-albuterol  3 mL Nebulization TID  . levothyroxine  25 mcg Oral QAC breakfast  .  lisinopril  10 mg Oral Daily  . magnesium oxide  400 mg Oral BID  . metFORMIN  500 mg Oral BID WC  . methylPREDNISolone (SOLU-MEDROL) injection  40 mg Intravenous Q12H  . naloxegol oxalate  25 mg Oral Daily  . senna-docusate  1 tablet Oral BID AC   Continuous: . cefTRIAXone (ROCEPHIN)  IV Stopped (09/30/17 2225)   NTI:[id number], albuterol, cyclobenzaprine, levalbuterol, LORazepam, magnesium hydroxide, nitroGLYCERIN, ondansetron **OR** ondansetron (ZOFRAN) IV, oxyCODONE  Assesment: She was admitted with COPD exacerbation. She has sleep apnea but refuses CPAP and that may be part of the issue with her having nightmares. She has urinary tract infection and says that she has had multiple UTIs. She says there has been discussion about her going home later today. She did improve with  Brio Principal Problem:   COPD exacerbation ([place]) Active Problems:   Hypothyroidism   DM type 2 causing complication (HCC)   Hyperlipidemia   Essential hypertension   Chronic back pain   OSA (obstructive sleep apnea)   CAD (coronary artery disease)   Acute lower UTI   Hyperkalemia   Anemia   Atypical chest pain   Diastolic dysfunction    Plan: I would plan to send her home on BREo. If she needs suppressive therapy for recurrent urinary tract infections trimethoprim would be a better choice than Macrobid considering that she has significant lung disease.    LOS: 0 days   [name] [name] L 10/01/2017, 8:00 AM

## 2017-10-01 NOTE — Discharge Planning (Addendum)
Patient IV and tele removed. RN assessment and VS revealed stability for DC to home.  Discharge papers given, explained an educated.  No FU appts needed at this time.  Scripts e-scribed to [facility], [place]. Waiting on ride to arrive - once arrives will be wheeled to front and family transporting home via car.

## 2017-10-01 NOTE — Progress Notes (Signed)
Inpatient Diabetes Program Recommendations  AACE/ADA: New Consensus Statement on Inpatient Glycemic Control (2015)  Target Ranges:  Prepandial:   less than 140 mg/dL      Peak postprandial:   less than 180 mg/dL (1-2 hours)      Critically ill patients:  140 - 180 mg/dL   Results for [name] [name], [name] [name] (MRN [id number]) as of 10/01/2017 08:28  Ref. Range 09/30/2017 07:36 09/30/2017 11:36 09/30/2017 16:13 09/30/2017 20:50 10/01/2017 07:46  Glucose-Capillary Latest Ref Range: 65 - 99 mg/dL 657 (H) 846 (H) 962 (H) 120 (H) 228 (H)    Review of Glycemic Control  Diabetes history: DM2 Outpatient Diabetes medications: 70/30 40 units BID, Metformin 500 mg BID Current orders for Inpatient glycemic control: 70/30 40 units BID, Novolog 0-15 units TID with meals, Metformin 500 mg BID  Inpatient Diabetes Program Recommendations: Insulin - Basal: If steroids are continued, please consider increasing 70/30 to 43 units BID. Correction (SSI): Please consider ordering Novolog 0-5 units QHS for bedtime correction.  NOTE: Patient is ordered Solumedrol 40 mg Q12H which is contributing to hyperglycemia.   Thanks, Orlando Penner, RN, MSN, CDE Diabetes Coordinator Inpatient Diabetes Program [phone] (Team Pager from 8am to 5pm)

## 2017-10-01 NOTE — Discharge Summary (Signed)
Physician Discharge Summary  [name] [name] YNW:[id number] DOB: 08-Feb-1936 DOA: 09/28/2017  PCP: Oval Linsey, MD  Admit date: 09/28/2017 Discharge date: 10/01/2017   Recommendations for Outpatient Follow-up:  The patient is instructed to take prednisone 20 mg tablets 2 tablets daily for 14 days to use her nebulizer therapy 4 times a day at home with DuoNeb to take all of her insulin as well as her antihypertensive medicines and Brio inhaler which is to be used once a day she should follow-up in office in 3 days' time to assess bronchospastic component as well as UTI additionally she is told to take Cipro 4 mg by mouth twice a day for an additional 5 days at home Discharge Diagnoses:  Principal Problem:   COPD exacerbation (HCC) Active Problems:   Hypothyroidism   DM type 2 causing complication (HCC)   Hyperlipidemia   Essential hypertension   Chronic back pain   OSA (obstructive sleep apnea)   CAD (coronary artery disease)   Acute lower UTI   Hyperkalemia   Anemia   Atypical chest pain   Diastolic dysfunction   Discharge Condition: Good  Filed Weights   09/28/17 1845 09/28/17 2303  Weight: 86.2 kg (190 lb) 86 kg (189 lb 11.2 oz)    History of present illness:  The patient is a 81 year old debilitated white female who has a COPD exacerbation she likewise was admitted to hospice for some generalized abdominal discomfort found to have a clinically UTI placed on IV Rocephin she had significant bronchospasm was placed on IV steroids DuoNeb nebulizer therapy was seen in consultation by pulmonology she was given administered intravenous Solu-Medrol for several days and this was reduced and simply discharged on by mouth prednisone 40 mg per day for 14 days she likewise was given by mouth Cipro 500 mg twice a day for 5 additional days in addition she is to take her insulin and antihypertensive medicines as ordered and found to have significant iron deficiency with elevated TIBC  low iron and ferritin in hospital admission results take Feosol 325 mg by mouth 3 times a day and this cyst be followed up with a CBC as well as iron levels in the office in several weeks time  Hospital Course:  See history of present illness above  Procedures:    Consultations:  Pulmonary  Discharge Instructions  Discharge Instructions    Discharge instructions    Complete by:  As directed    Discharge patient    Complete by:  As directed    Discharge disposition:  01-Home or Self Care   Discharge patient date:  10/01/2017     Allergies as of 10/01/2017      Reactions   Codeine Itching      Medication List    STOP taking these medications   cyclobenzaprine 5 MG tablet Commonly known as:  FLEXERIL   guaiFENesin 100 MG/5ML Soln Commonly known as:  ROBITUSSIN   OVER THE COUNTER MEDICATION   OVER THE COUNTER MEDICATION   Oxycodone HCl 10 MG Tabs     TAKE these medications   amLODipine 5 MG tablet Commonly known as:  NORVASC Take 5 mg by mouth daily.   aspirin EC 325 MG tablet Take 325 mg by mouth daily.   ciprofloxacin 500 MG tablet Commonly known as:  CIPRO Take 1 tablet (500 mg total) by mouth 2 (two) times daily.   ferrous sulfate 325 (65 FE) MG tablet Take 1 tablet (325 mg total) by mouth 3 (  three) times daily with meals.   fluticasone furoate-vilanterol 100-25 MCG/INH Aepb Commonly known as:  BREO ELLIPTA Inhale 1 puff into the lungs daily.   gabapentin 300 MG capsule Commonly known as:  NEURONTIN Take 300 mg by mouth 2 (two) times daily.   HYDROcodone-acetaminophen 5-325 MG tablet Commonly known as:  NORCO Take 1-2 tablets by mouth every 4 (four) hours as needed (do not take with your home norco).   insulin NPH-regular Human (70-30) 100 UNIT/ML injection Commonly known as:  NOVOLIN 70/30 Inject 40 Units into the skin 2 (two) times daily.   ipratropium-albuterol 0.5-2.5 (3) MG/3ML Soln Commonly known as:  DUONEB Inhale 3 mLs into the  lungs 4 (four) times daily as needed.   levothyroxine 25 MCG tablet Commonly known as:  SYNTHROID, LEVOTHROID Take 25 mcg by mouth daily.   lisinopril 10 MG tablet Commonly known as:  PRINIVIL,ZESTRIL Take 10 mg by mouth daily.   LORazepam 2 MG tablet Commonly known as:  ATIVAN Take 2 mg by mouth at bedtime as needed for sleep.   metFORMIN 500 MG tablet Commonly known as:  GLUCOPHAGE Take 500 mg by mouth 2 (two) times daily with a meal.   nitroGLYCERIN 0.4 MG SL tablet Commonly known as:  NITROSTAT Place 0.4 mg under the tongue every 5 (five) minutes as needed for chest pain.   predniSONE 20 MG tablet Commonly known as:  DELTASONE Take 1 tablet (20 mg total) by mouth 2 (two) times daily with a meal. What changed:  when to take this   VITAMIN D (ERGOCALCIFEROL) PO Take 1 capsule by mouth daily.      Allergies  Allergen Reactions  . Codeine Itching      The results of significant diagnostics from this hospitalization (including imaging, microbiology, ancillary and laboratory) are listed below for reference.    Significant Diagnostic Studies: Dg Chest 2 View  Result Date: 09/28/2017 CLINICAL DATA:  Cough x3 days EXAM: CHEST  2 VIEW COMPARISON:  05/19/2017 FINDINGS: Status post CABG with minimal aortic atherosclerosis. Top normal size cardiac silhouette. No pneumonic consolidation nor overt pulmonary edema. Stable pulmonary scarring in the left upper lobe. No acute nor suspicious osseous abnormalities. Degenerative changes are stable along the dorsal spine. IMPRESSION: Status post CABG. Left upper lobe scarring. No pneumonic consolidation or effusion. Electronically Signed   By: Tollie Eth M.D.   On: 09/28/2017 20:32    Microbiology: Recent Results (from the past 240 hour(s))  Urine culture     Status: Abnormal   Collection Time: 09/28/17  6:00 PM  Result Value Ref Range Status   Specimen Description URINE, CLEAN CATCH  Final   Special Requests Normal  Final    Culture MULTIPLE SPECIES PRESENT, SUGGEST RECOLLECTION (A)  Final   Report Status 09/30/2017 FINAL  Final     Labs: Basic Metabolic Panel:  Recent Labs Lab 09/28/17 1910 09/29/17 0918  NA 134* 134*  K 5.3* 4.6  CL 100* 99*  CO2 25 26  GLUCOSE 296* 240*  BUN 20 18  CREATININE 0.85 0.74  CALCIUM 9.0 9.1  MG 2.1  --    Liver Function Tests:  Recent Labs Lab 09/28/17 1910  AST 24  ALT 14  ALKPHOS 74  BILITOT 0.5  PROT 6.6  ALBUMIN 3.8   No results for input(s): LIPASE, AMYLASE in the last 168 hours. No results for input(s): AMMONIA in the last 168 hours. CBC:  Recent Labs Lab 09/28/17 1910 09/29/17 0918  WBC 13.6* 12.1*  NEUTROABS 7.2  --   HGB 7.9* 8.7*  HCT 29.0* 32.2*  MCV 62.2* 62.9*  PLT 346 371   Cardiac Enzymes:  Recent Labs Lab 09/28/17 1910 09/29/17 0406 09/29/17 0918  TROPONINI <0.03 <0.03 <0.03   BNP: BNP (last 3 results) No results for input(s): BNP in the last 8760 hours.  ProBNP (last 3 results) No results for input(s): PROBNP in the last 8760 hours.  CBG:  Recent Labs Lab 09/30/17 1136 09/30/17 1613 09/30/17 2050 10/01/17 0746 10/01/17 1135  GLUCAP 324* 230* 120* 228* 208*       Signed:  Shylie Polo M   Pager: [phone] 10/01/2017, 12:52 PM

## 2017-10-06 ENCOUNTER — Emergency Department (HOSPITAL_COMMUNITY)
Admission: EM | Admit: 2017-10-06 | Discharge: 2017-10-06 | Disposition: A | Discharge status: Home / Self Care | Payer: Medicare HMO | Source: Home / Self Care

## 2017-10-06 ENCOUNTER — Encounter (HOSPITAL_COMMUNITY): Payer: Self-pay | Admitting: *Deleted

## 2017-10-06 ENCOUNTER — Encounter (HOSPITAL_COMMUNITY): Payer: Self-pay

## 2017-10-06 DIAGNOSIS — R21 Rash and other nonspecific skin eruption: Secondary | ICD-10-CM | POA: Insufficient documentation

## 2017-10-06 DIAGNOSIS — Z79899 Other long term (current) drug therapy: Secondary | ICD-10-CM | POA: Diagnosis not present

## 2017-10-06 DIAGNOSIS — I5030 Unspecified diastolic (congestive) heart failure: Secondary | ICD-10-CM | POA: Diagnosis not present

## 2017-10-06 DIAGNOSIS — E1140 Type 2 diabetes mellitus with diabetic neuropathy, unspecified: Secondary | ICD-10-CM | POA: Insufficient documentation

## 2017-10-06 DIAGNOSIS — E039 Hypothyroidism, unspecified: Secondary | ICD-10-CM | POA: Insufficient documentation

## 2017-10-06 DIAGNOSIS — I110 Hypertensive heart disease with heart failure: Secondary | ICD-10-CM | POA: Diagnosis not present

## 2017-10-06 DIAGNOSIS — I252 Old myocardial infarction: Secondary | ICD-10-CM | POA: Diagnosis not present

## 2017-10-06 DIAGNOSIS — Z794 Long term (current) use of insulin: Secondary | ICD-10-CM | POA: Insufficient documentation

## 2017-10-06 DIAGNOSIS — I2510 Atherosclerotic heart disease of native coronary artery without angina pectoris: Secondary | ICD-10-CM | POA: Insufficient documentation

## 2017-10-06 DIAGNOSIS — Z87891 Personal history of nicotine dependence: Secondary | ICD-10-CM

## 2017-10-06 DIAGNOSIS — Z7982 Long term (current) use of aspirin: Secondary | ICD-10-CM | POA: Diagnosis not present

## 2017-10-06 DIAGNOSIS — R102 Pelvic and perineal pain: Secondary | ICD-10-CM | POA: Diagnosis present

## 2017-10-06 DIAGNOSIS — N760 Acute vaginitis: Secondary | ICD-10-CM | POA: Insufficient documentation

## 2017-10-06 NOTE — ED Triage Notes (Signed)
Patient was seen here last week for UTI. Patient now has sores with white discharge. on both labias. Patient states she had puriick applied when here due to urinary incontinence.

## 2017-10-06 NOTE — ED Notes (Signed)
Pt is in the lobby and writhing in the chair as well as standing up and yelling, "Help me Help me." Pt states she can't sit down because it hurts. Have advised pt that she needs to sit down so she won't fall.

## 2017-10-06 NOTE — ED Notes (Signed)
Pt stated she was tired of waiting and was leaving to go home.

## 2017-10-06 NOTE — ED Triage Notes (Signed)
Pt seen and treated for uti last week, states that she started having "bumps" and vaginal pain over the past few days, was seen by PCP yesterday and was given prescription for ketoconazole cream but pt states that it has not helped,

## 2017-10-07 ENCOUNTER — Emergency Department (HOSPITAL_COMMUNITY)
Admission: EM | Admit: 2017-10-07 | Discharge: 2017-10-07 | Disposition: A | Discharge status: Home / Self Care | Payer: Medicare HMO | Attending: Emergency Medicine | Admitting: Emergency Medicine

## 2017-10-07 DIAGNOSIS — N760 Acute vaginitis: Secondary | ICD-10-CM

## 2017-10-07 MED ORDER — HYDROMORPHONE HCL 1 MG/ML IJ SOLN
0.5000 mg | Freq: Once | INTRAMUSCULAR | Status: AC
  Administered 2017-10-07: 0.5 mg via INTRAMUSCULAR
  Filled 2017-10-07: qty 1

## 2017-10-07 MED ORDER — VALACYCLOVIR HCL 500 MG PO TABS
1000.0000 mg | ORAL_TABLET | Freq: Once | ORAL | Status: AC
  Administered 2017-10-07: 1000 mg via ORAL
  Filled 2017-10-07: qty 2

## 2017-10-07 MED ORDER — VALACYCLOVIR HCL 1 G PO TABS: 1000.0000 mg | ORAL_TABLET | Freq: Two times a day (BID) | ORAL | 0 refills | Status: AC

## 2017-10-07 MED ORDER — HYDROCODONE-ACETAMINOPHEN 5-325 MG PO TABS
2.0000 | ORAL_TABLET | Freq: Once | ORAL | Status: AC
  Administered 2017-10-07: 2 via ORAL
  Filled 2017-10-07: qty 2

## 2017-10-07 NOTE — ED Notes (Signed)
Pt alert & oriented x4, stable gait. Patient given discharge instructions, paperwork & prescription(s). Patient verbalized understanding. Pt left department in wheelchair escorted by staff. Pt left w/ no further questions. 

## 2017-10-07 NOTE — ED Provider Notes (Signed)
[facility] EMERGENCY DEPARTMENT Provider Note   CSN: [id number] Arrival date & time: 10/06/17  2201     History   Chief Complaint Chief Complaint  Patient presents with  . Vaginal Pain    HPI [name] [name] is a 81 y.o. female.  The history is provided by the patient.  Vaginal Pain  This is a new problem. The current episode started more than 2 days ago. The problem occurs daily. The problem has been rapidly worsening. The symptoms are aggravated by walking (urination). Nothing relieves the symptoms.  pt reports vaginal pain/rash for past 2-3 days She was recently diagnosed with UTI and taking antibiotics She began having rash and vaginal pain, seen by PCP and diagnosed with yeast infection and given antifungals However her symptoms worsened She reports while in hospital recently she had an external urinary catheter in place and thought this caused her symptoms  Past Medical History:  Diagnosis Date  . Arthritis   . CAD (coronary artery disease)    Multivessel status post CABG 2001  . Chronic obstructive pulmonary disease (HCC)   . DDD (degenerative disc disease), lumbar   . Diastolic CHF (HCC)   . Essential hypertension   . Hematuria   . History of CVA (cerebrovascular accident)    2013 - lacunar infarct right thalamus - residual left side of  lip numb  and left eye vision worse (which corrented lens implant from cataract extraction)  . History of MI (myocardial infarction)    July 2001  . Hyperlipidemia   . Hypothyroidism   . Insulin dependent type 2 diabetes mellitus (HCC)   . Lesion of bladder   . Lower urinary tract symptoms (LUTS)   . OSA (obstructive sleep apnea)   . Peripheral neuropathy   . Psoriasis   . SUI (stress urinary incontinence, female)   . Varicose veins     Patient Active Problem List   Diagnosis Date Noted  . Anemia 09/29/2017  . Atypical chest pain 09/29/2017  . Diastolic dysfunction 09/29/2017  . CAD (coronary artery disease)  09/28/2017  . Acute lower UTI 09/28/2017  . Hyperkalemia 09/28/2017  . Ischemic stroke (HCC) 05/10/2017  . Acute ischemic stroke (HCC) 05/10/2017  . Stroke (HCC) 08/01/2016  . Dysarthria 03/18/2016  . Influenza-like illness 11/19/2015  . Anemia of chronic disease 11/19/2015  . OSA (obstructive sleep apnea) 11/19/2015  . Left facial numbness 08/29/2015  . Gait instability 06/26/2015  . Bronchitis 11/13/2014  . COPD exacerbation (HCC) 11/13/2014  . UTI (urinary tract infection) 08/26/2014  . Near syncope 08/24/2014  . UTI (lower urinary tract infection) 12/10/2013  . Hypoxia 12/10/2013  . Weakness generalized 12/10/2013  . TIA (transient ischemic attack) 06/22/2012  . Left-sided headache 06/22/2012  . Noncompliance 06/22/2012  . Noncompliance with CPAP treatment 06/22/2012  . Psoriasis 01/07/2012  . Cerebrovascular disease   . Chronic back pain   . Chronic obstructive pulmonary disease (HCC)   . Chest pain 08/28/2011  . DM type 2 causing complication (HCC) 02/14/2010  . Obesity 02/14/2010  . Arteriosclerotic cardiovascular disease (ASCVD) 02/14/2010  . Hypothyroidism 02/06/2010  . Hyperlipidemia 02/06/2010  . Essential hypertension 02/06/2010  . Sleep apnea 02/06/2010    Past Surgical History:  Procedure Laterality Date  . APPENDECTOMY  age 72  . CARDIAC CATHETERIZATION  1991   No sig. cad  . CARDIAC CATHETERIZATION  07-01-2000   high grade in-stent restenosis  . CARDIAC CATHETERIZATION  04-30-2001   single native vessel cad/ graft patent  .  CARDIAC CATHETERIZATION  02-13-2006  dr Jenness [place] cad with total occluded LAD  and  70% ostial of small ramus/  grafts patent/  normal ef  . CARDIAC CATHETERIZATION  01-20-2000  dr Clifton James   essentially unchanged;  severe single vessel cad with diffuse disease throughout CFX and RCA/ ef 66%;  chf with preserved LVSF  . CARDIOVASCULAR STRESS TEST  08-28-2011   DR ROTHBART   NEGATIVE NUCLEAR STUDY/  GLOBAL LVSF/  EF 65%  . CARPAL  TUNNEL RELEASE Bilateral yrs ago  . CATARACT EXTRACTION W/ INTRAOCULAR LENS  IMPLANT, BILATERAL  2013  . CHOLECYSTECTOMY  1990  . COLONOSCOPY N/A 07/26/2015   Procedure: COLONOSCOPY;  Surgeon: Malissa Hippo, MD;  Location: [facility];  Service: Endoscopy;  Laterality: N/A;  1200  . CORONARY ANGIOPLASTY  11-13-1999   balloon angioplasty to mLAD , d2 of LAD  . CORONARY ANGIOPLASTY WITH STENT PLACEMENT  01-17-2000   PCI stenting to mLAD & D2 of LAD  . CORONARY ARTERY BYPASS GRAFT  07-02-2000   DR Tressie Stalker   SVG to diagonal, LIMA to LAD  . CYSTOSCOPY W/ RETROGRADES Bilateral 08/09/2014   Procedure: CYSTOSCOPY, BILATERAL RETROGRADE, HYDRODISTENSION, BLADDER BIOPSY WITH FULGERATION, INSTILL PYRIDIUM AND MARCAINE ;  Surgeon: Anner Crete, MD;  Location: [facility];  Service: Urology;  Laterality: Bilateral;  . KNEE ARTHROSCOPY Left 10-18-2003   SYNOVECTOMY  . LUMBAR SPINE SURGERY  x2    yrs ago  . ORIF HUMERUS FRACTURE Right 04-18-2008  . REPAIR FLEXOR TENDON HAND Right 09-20-2008   CARPI RADIALIS TENDON TRANSFER TO EXTENSOR TO INDEX, MIDDLE, RING , LITTLE FINGERS AND DIGITI MININI  . ROTATOR CUFF REPAIR Right 1995  . TOTAL ABDOMINAL HYSTERECTOMY W/ BILATERAL SALPINGOOPHORECTOMY  age 46  . TRANSTHORACIC ECHOCARDIOGRAM  12-12-2013   MILD LVH/  EF 60-65%/  GRADE I DIASTOLIC DYSFUNCTION/  MILD LAE    OB History    Gravida Para Term Preterm AB Living   SAB TAB Ectopic Multiple Live Births                   Home Medications    Prior to Admission medications   Medication Sig Start Date End Date Taking? Authorizing Provider  amLODipine (NORVASC) 5 MG tablet Take 5 mg by mouth daily. 07/03/17   [provider]  aspirin EC 325 MG tablet Take 325 mg by mouth daily.    [provider]  ciprofloxacin (CIPRO) 500 MG tablet Take 1 tablet (500 mg total) by mouth 2 (two) times daily. 10/01/17   Oval Linsey, MD  ferrous sulfate 325 (65 FE)  MG tablet Take 1 tablet (325 mg total) by mouth 3 (three) times daily with meals. 10/01/17   Oval Linsey, MD  fluticasone furoate-vilanterol (BREO ELLIPTA) 100-25 MCG/INH AEPB Inhale 1 puff into the lungs daily. 10/02/17   Oval Linsey, MD  gabapentin (NEURONTIN) 300 MG capsule Take 300 mg by mouth 2 (two) times daily. 09/18/17   [provider]  HYDROcodone-acetaminophen (NORCO) 5-325 MG tablet Take 1-2 tablets by mouth every 4 (four) hours as needed (do not take with your home norco). 05/19/17   Blane Ohara, MD  insulin NPH-regular Human (NOVOLIN 70/30) (70-30) 100 UNIT/ML injection Inject 40 Units into the skin 2 (two) times daily.    [provider]  ipratropium-albuterol (DUONEB) 0.5-2.5 (3) MG/3ML SOLN Inhale 3 mLs into the lungs 4 (four) times  daily as needed. 09/19/17   [provider]  levothyroxine (SYNTHROID, LEVOTHROID) 25 MCG tablet Take 25 mcg by mouth daily.     [provider]  lisinopril (PRINIVIL,ZESTRIL) 10 MG tablet Take 10 mg by mouth daily.    [provider]  LORazepam (ATIVAN) 2 MG tablet Take 2 mg by mouth at bedtime as needed for sleep.     [provider]  metFORMIN (GLUCOPHAGE) 500 MG tablet Take 500 mg by mouth 2 (two) times daily with a meal.      [provider]  nitroGLYCERIN (NITROSTAT) 0.4 MG SL tablet Place 0.4 mg under the tongue every 5 (five) minutes as needed for chest pain.     [provider]  predniSONE (DELTASONE) 20 MG tablet Take 1 tablet (20 mg total) by mouth 2 (two) times daily with a meal. 10/01/17 10/15/17  Oval Linsey, MD  VITAMIN D, ERGOCALCIFEROL, PO Take 1 capsule by mouth daily.    [provider]    Family History Family History  Problem Relation Age of Onset  . Hypertension Mother   . Stroke Mother   . Coronary artery disease Unknown        Multiple first and second-degree relatives  . Aneurysm Unknown        Cerebral circulation  . Colon  cancer Sister   . Stroke Sister   . Coronary artery disease Sister   . Clotting disorder Unknown        Children diagnosed with hypercoagulable state  . Coronary artery disease Brother     Social History Social History  Substance Use Topics  . Smoking status: Never Smoker  . Smokeless tobacco: Never Used  . Alcohol use No     Allergies   Codeine   Review of Systems Review of Systems  Respiratory: Positive for cough.   Genitourinary: Positive for dysuria and vaginal pain.  Skin: Positive for rash.  All other systems reviewed and are negative.    Physical Exam Updated Vital Signs BP 138/60 (BP Location: Left Arm)   Pulse 88   Temp 97.7 F (36.5 C) (Oral)   Resp 20   Ht 1.626 m ( )   Wt 86.2 kg (190 lb)   SpO2 100%   BMI 32.61 kg/m   Physical Exam CONSTITUTIONAL: elderly, anxious HEAD: Normocephalic/atraumatic EYES: EOMI  ENMT: Mucous membranes moist NECK: supple no meningeal signs CV: S1/S2 noted, no murmurs/rubs/gallops noted LUNGS: Lungs are clear to auscultation bilaterally, no apparent distress ABDOMEN: soft, nontender ZO:XWRUEA tech Marylene Land present for exam: Erythematous rash to vulvar region with ?herpetic lesions,  No abscess.  No crepitus noted.  Minimal whitish vaginal discharge noted NEURO: Pt is awake/alert/appropriate, moves all extremitiesx4.    EXTREMITIES: pulses normal/equal, full ROM SKIN: warm, color normal PSYCH: anxious  ED Treatments / Results  Labs (all labs ordered are listed, but only abnormal results are displayed) Labs Reviewed - No data to display  EKG  EKG Interpretation None       Radiology No results found.  Procedures Procedures (including critical care time)  Medications Ordered in ED Medications  HYDROcodone-acetaminophen (NORCO/VICODIN) 5-325 MG per tablet 2 tablet (2 tablets Oral Given 10/07/17 0129)  valACYclovir (VALTREX) tablet 1,000 mg (1,000 mg Oral Given 10/07/17 0129)  HYDROmorphone (DILAUDID)  injection 0.5 mg (0.5 mg Intramuscular Given 10/07/17 0308)     Initial Impression / Assessment and Plan / ED Course  I have reviewed the triage vital signs and the nursing notes.  2:03 AM Pt with vaginal rash Clinically appears c/w HSV She denies any sexual activity for past several years, and denies ever having this rash, though she does recall having shingles previously This does not appear allergic in nature Pt reports burning type pain Plan to treat as HSV with PCP followup   Pt stable in the Ed She felt improved PCP f/u in 2 days She has pain meds at home per narcotic database  Final Clinical Impressions(s) / ED Diagnoses   Final diagnoses:  Vaginitis and vulvovaginitis    New Prescriptions Discharge Medication List as of 10/07/2017  3:33 AM    START taking these medications   Details  valACYclovir (VALTREX) 1000 MG tablet Take 1 tablet (1,000 mg total) by mouth 2 (two) times daily., Starting Tue 10/07/2017, Until Tue 10/21/2017, Print         [name], MD 10/07/17 [phone]

## 2017-10-14 IMAGING — CT CT HEAD W/O CM
1 series · 16 of 30 positions shown, 20 images · non-contrast
Comparison: CT brain scan of 08/29/2015

CLINICAL DATA: Headache, 1 episode of not being able to speak,
diabetes

EXAM:
CT HEAD WITHOUT CONTRAST
TECHNIQUE: Contiguous axial images were obtained from the base of the skull
through the vertex without intravenous contrast.

[Series 2: headseq 4.8 h37s · axial · 0.43mm/px · z∈[+89,+249]mm · 16 of 36 slices shown, 20 images]
[im 2/36  brain]
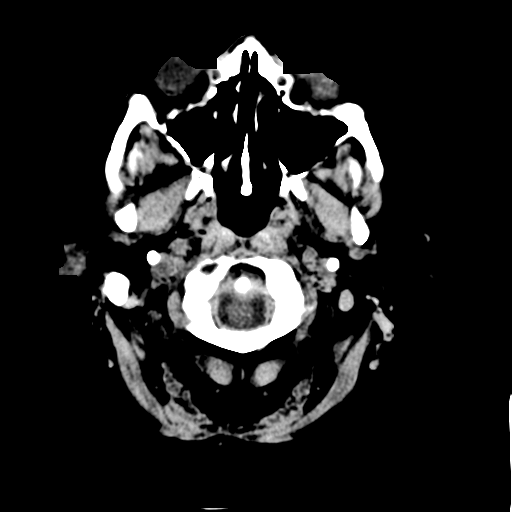
[im 2/36  bone]
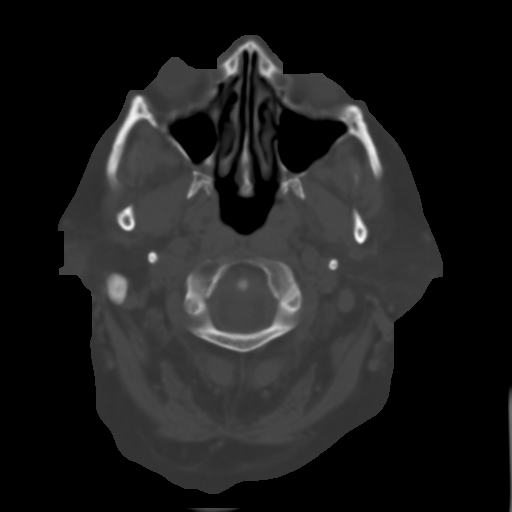
[im 4/36  brain]
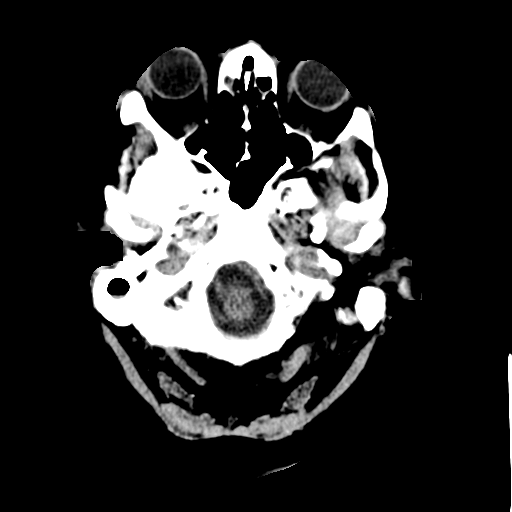
[im 7/36  brain]
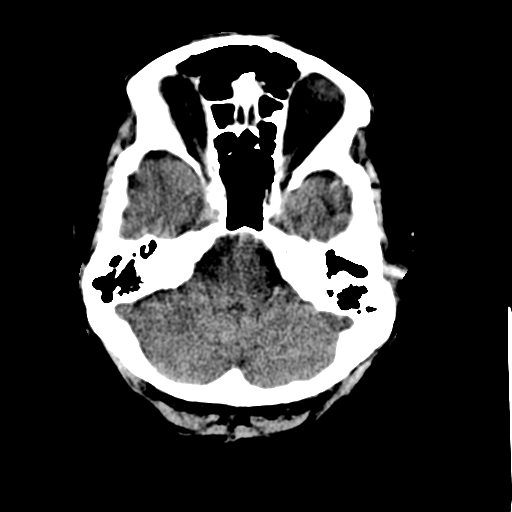
[im 9/36  brain]
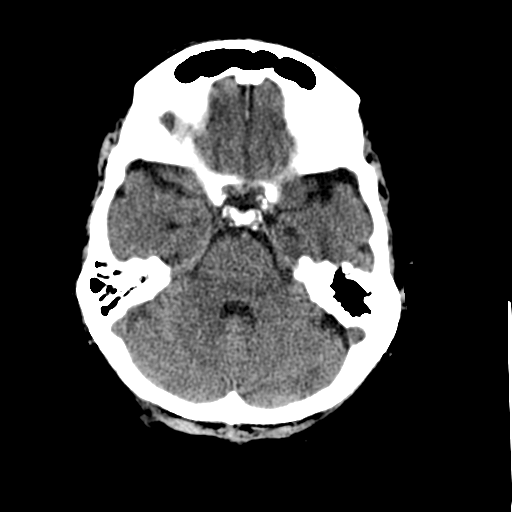
[im 10/36  brain]
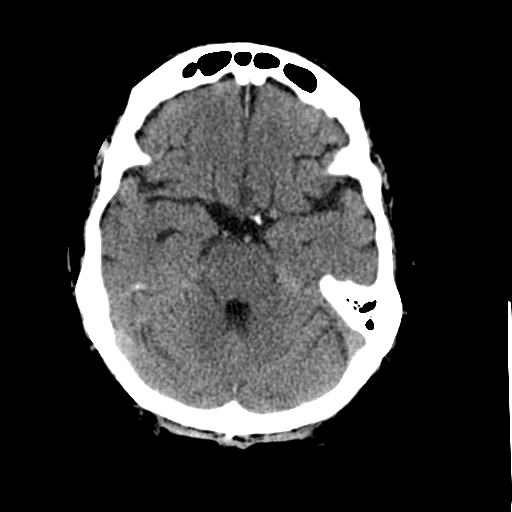
[im 10/36  bone]
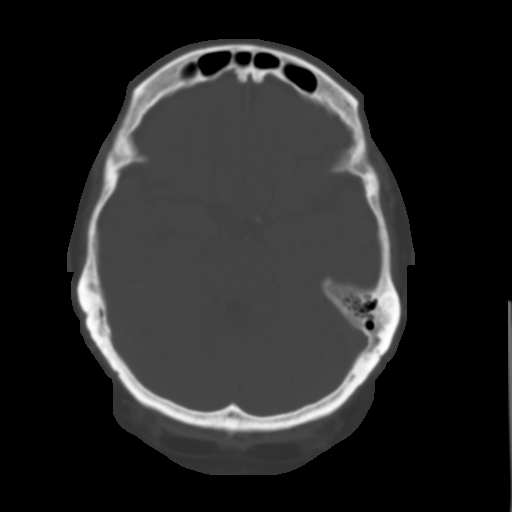
[im 13/36  brain]
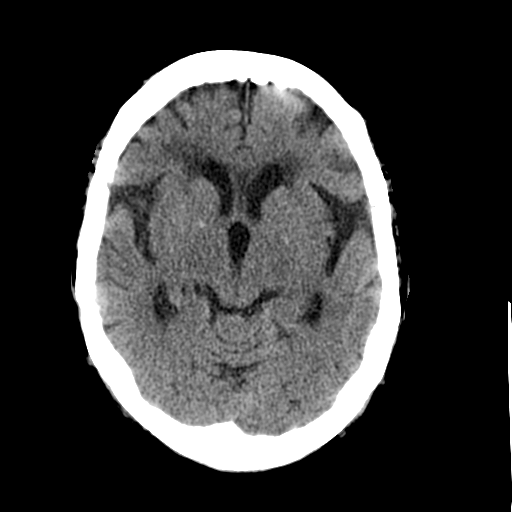
[im 15/36  brain]
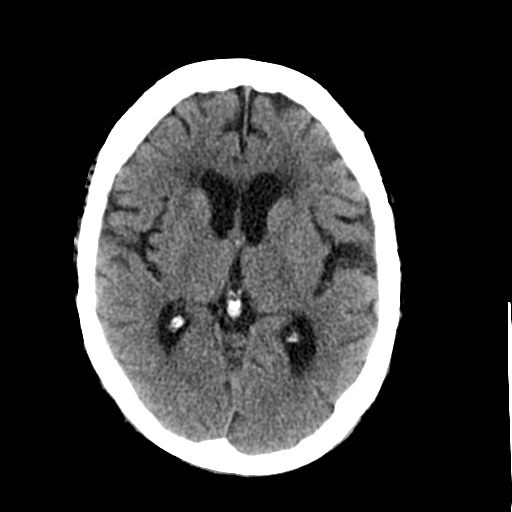
[im 17/36  brain]
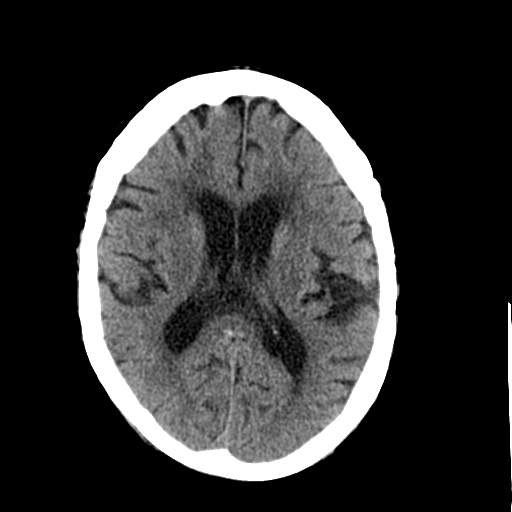
[im 19/36  brain]
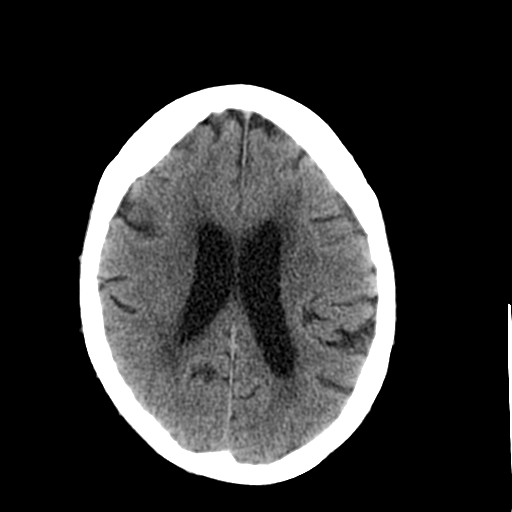
[im 19/36  bone]
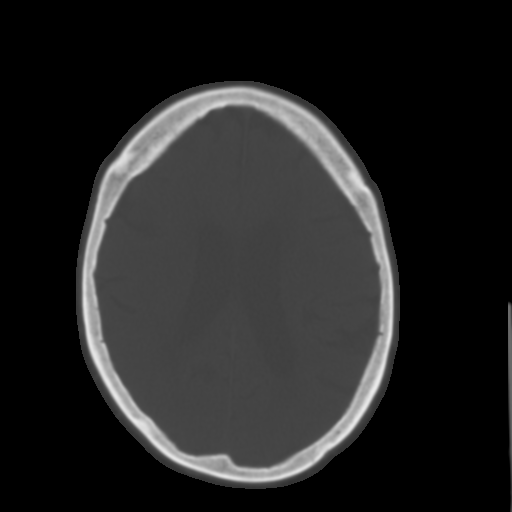
[im 21/36  brain]
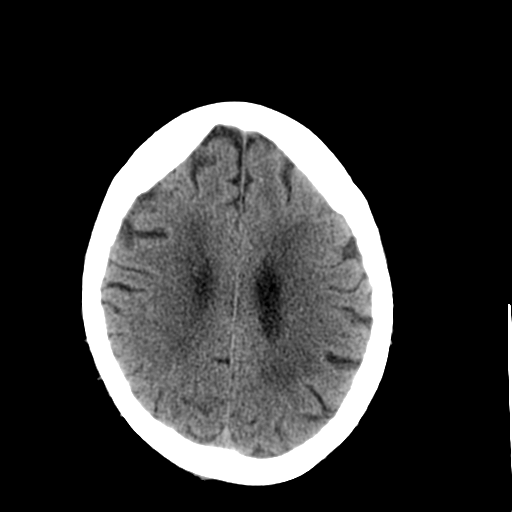
[im 23/36  brain]
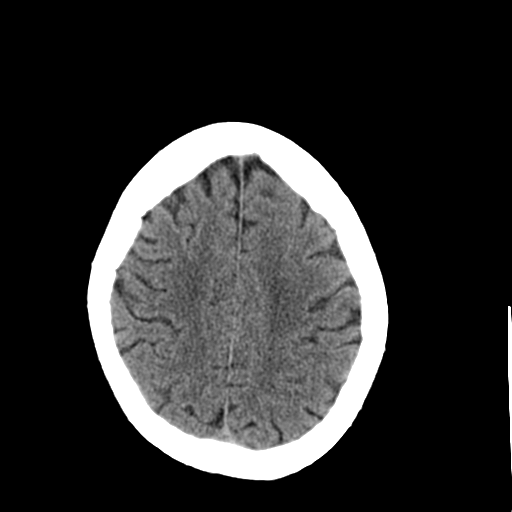
[im 26/36  brain]
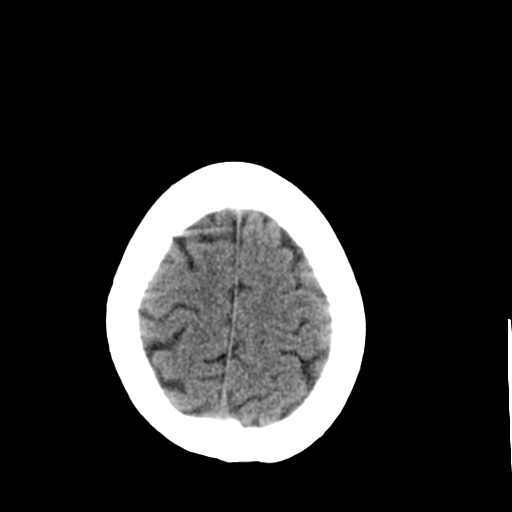
[im 27/36  brain]
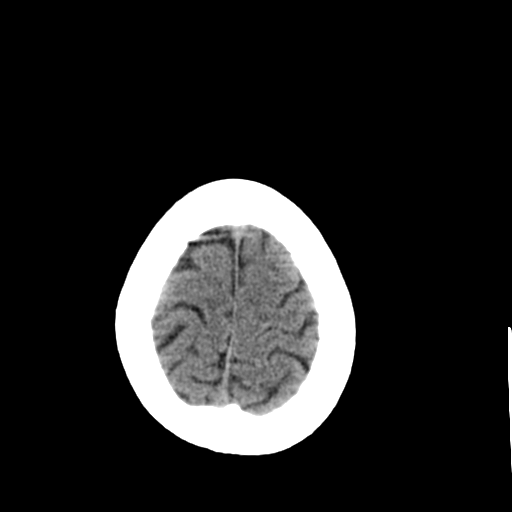
[im 27/36  bone]
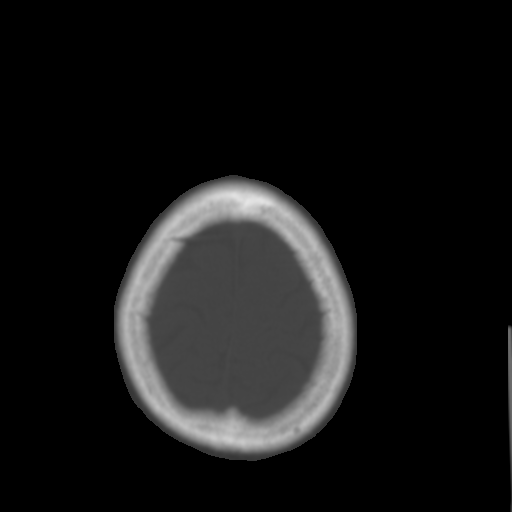
[im 29/36  brain]
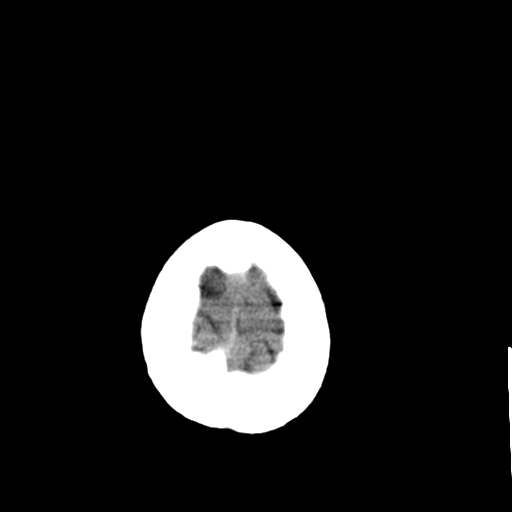
[im 32/36  brain]
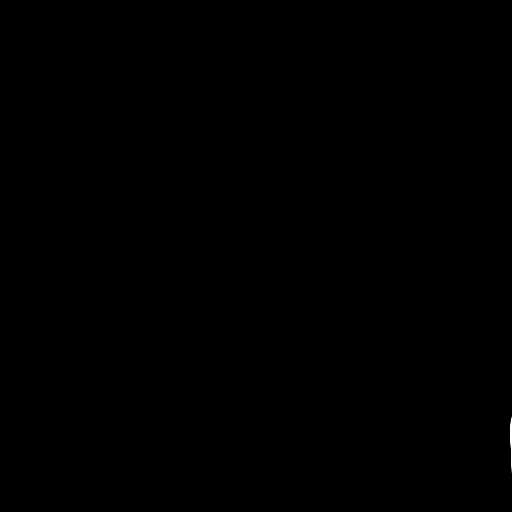
[im 34/36  brain]
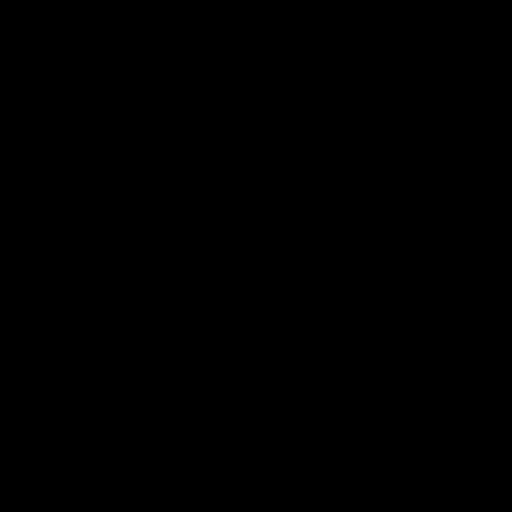

[16 of 30 positions shown; findings below may reference images not displayed]

FINDINGS: The ventricular system remains prominent, as are the cortical sulci
and to some degree the cerebellar folia consistent with diffuse
atrophy. The septum is midline in position. The fourth ventricle and
basilar a hit cisterns are unchanged. There is moderate small vessel
ischemic change throughout the periventricular white matter which
appears relatively stable. No acute hemorrhage, mass lesion, or
infarction is seen. On bone window images, no calvarial abnormality
is noted. The paranasal sinuses appear clear.
IMPRESSION: Little change in diffuse atrophy and moderate small vessel ischemic
change. No acute intracranial abnormality is seen.

## 2017-11-23 IMAGING — DX DG CHEST 2V
2 series · 2 of 2 positions shown · non-contrast
Comparison: 02/07/2015, 08/24/2014 and 11/13/2014

CLINICAL DATA: Cough, nausea and fever beginning yesterday.

EXAM:
CHEST  2 VIEW

[chest pa]
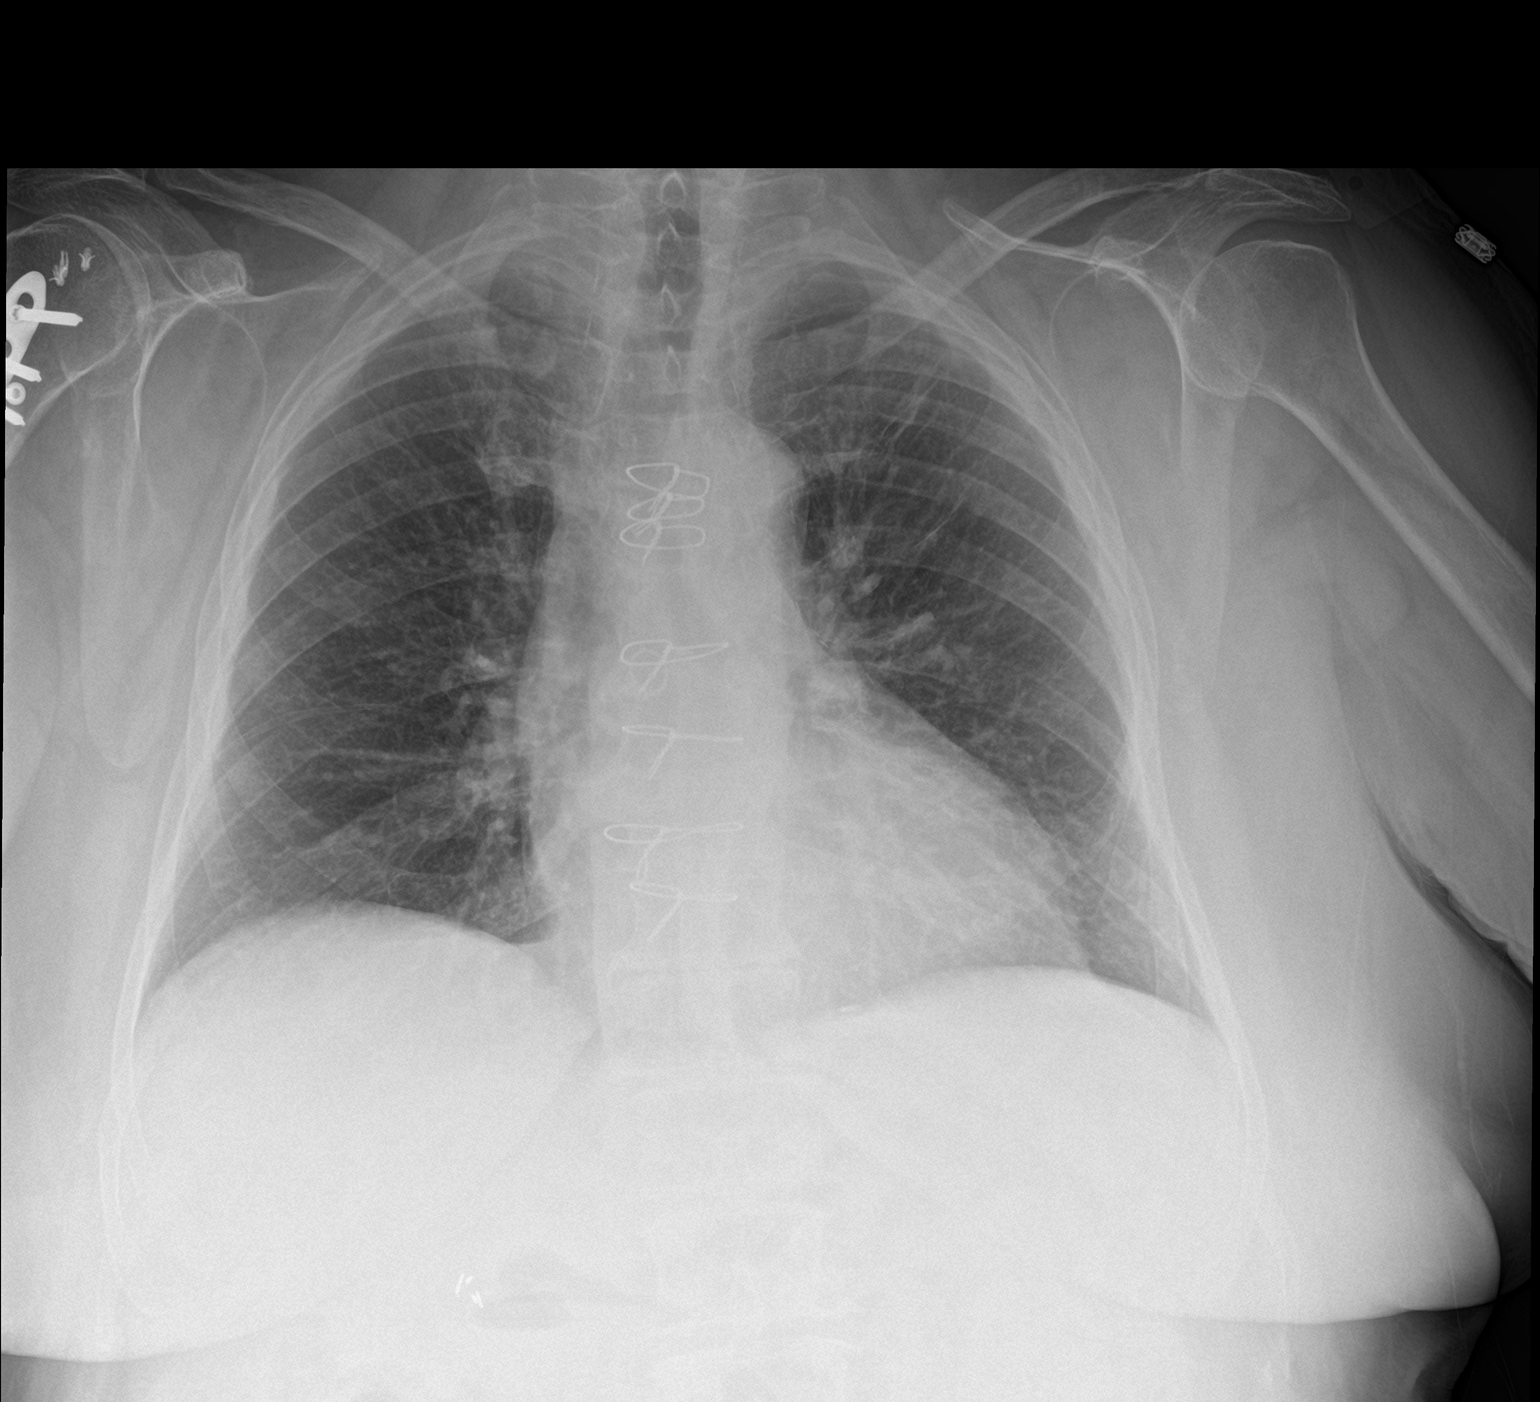

[chest lat]
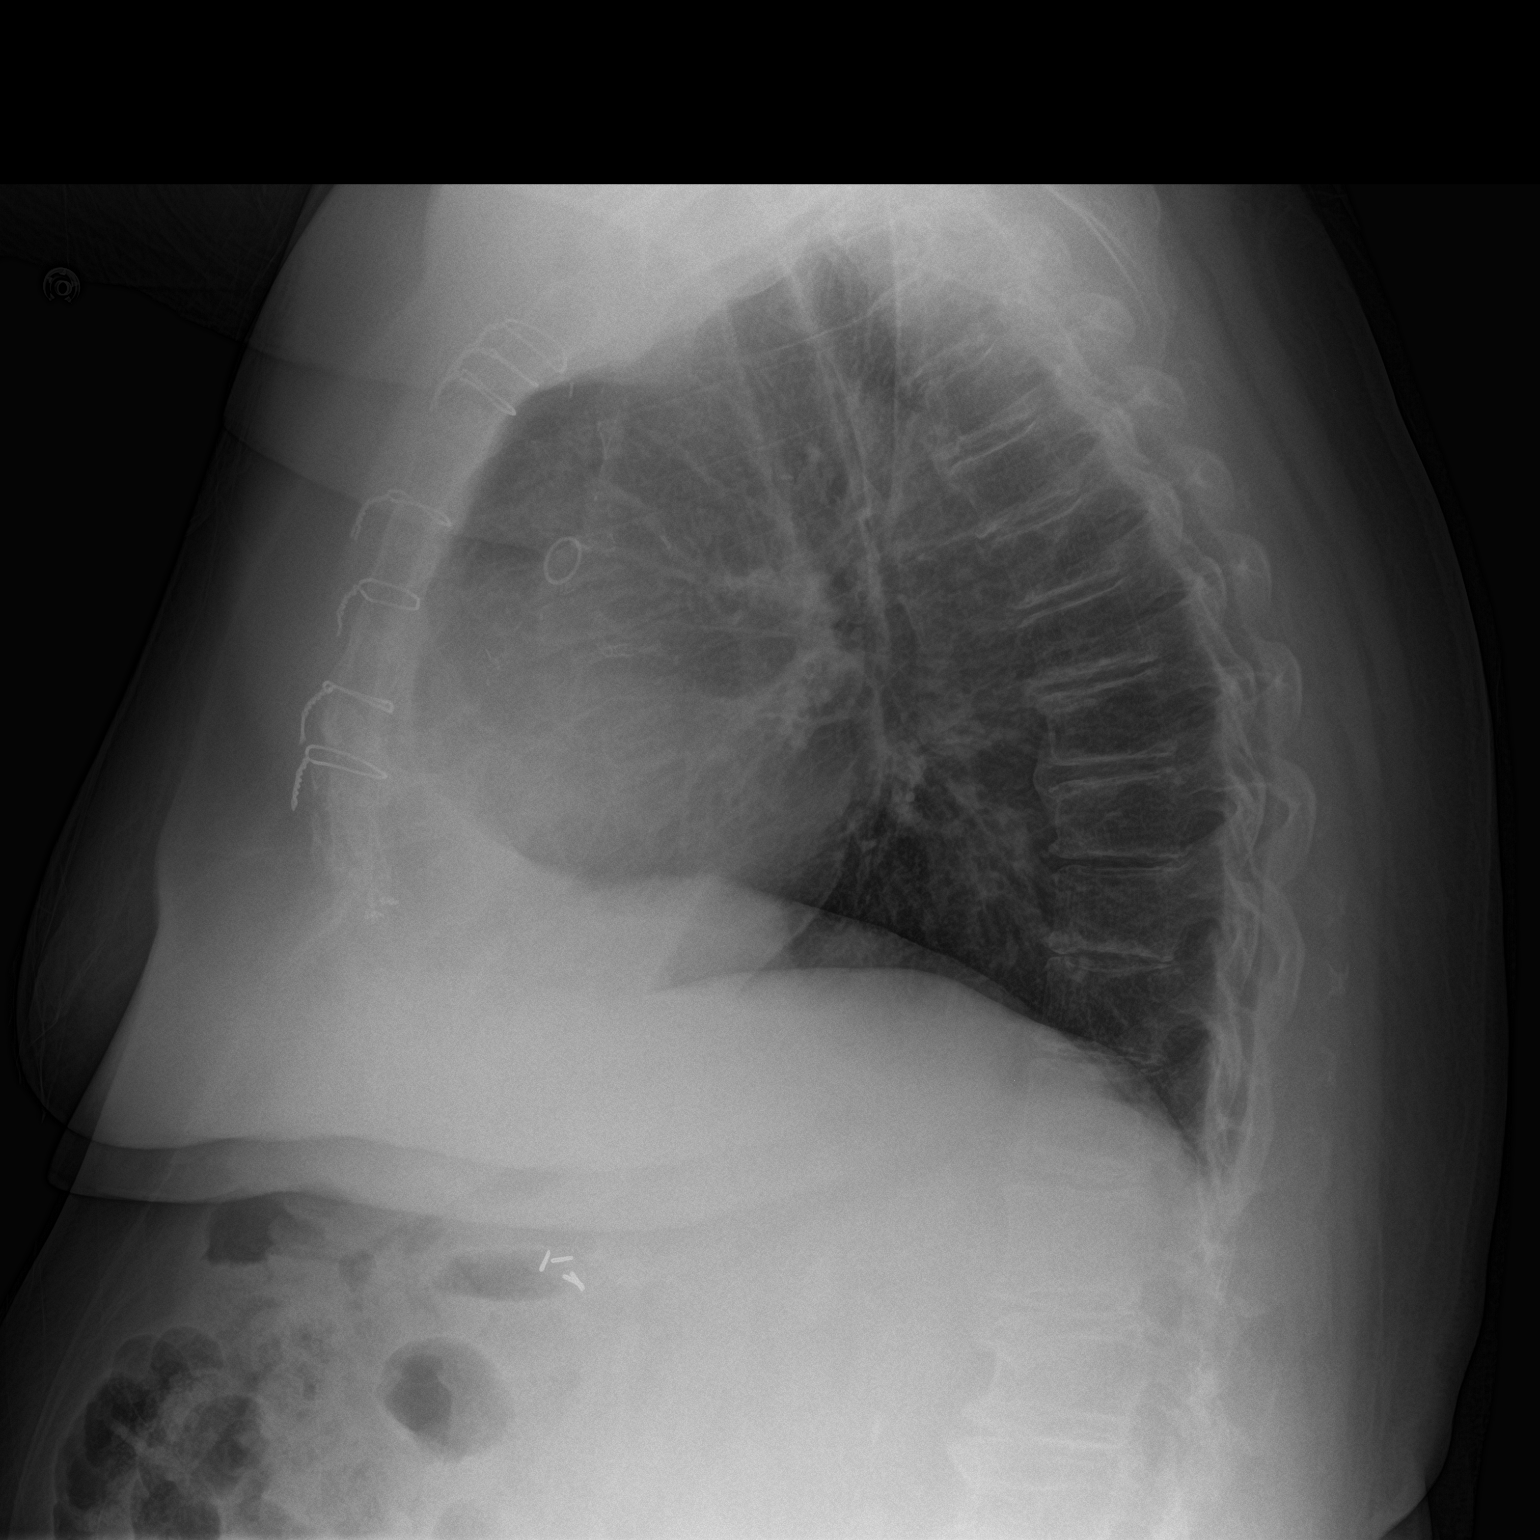

[2 of 2 positions shown; findings below may reference images not displayed]

FINDINGS: Sternotomy wires unchanged. Lungs are adequately inflated with
minimal scarring over the left upper lobe. No focal consolidation or
effusion. Cardiomediastinal silhouette and remainder of the exam is
unchanged.
IMPRESSION: No active cardiopulmonary disease.

## 2017-11-25 ENCOUNTER — Ambulatory Visit (INDEPENDENT_AMBULATORY_CARE_PROVIDER_SITE_OTHER): Payer: Medicare HMO | Admitting: Internal Medicine

## 2017-11-30 IMAGING — US US CAROTID DUPLEX BILAT
1 series · 13 of 24 positions shown · non-contrast
Comparison: MRI [DATE]/ 1462.  CT 03/18/2016.

CLINICAL DATA: CVA.

EXAM:
BILATERAL CAROTID DUPLEX ULTRASOUND
TECHNIQUE: Gray scale imaging, color Doppler and duplex ultrasound were
performed of bilateral carotid and vertebral arteries in the neck.

[Series 1: us carotid duplex bilat · 0.07mm/px · 13 of 80 slices shown]
[im 1/80]
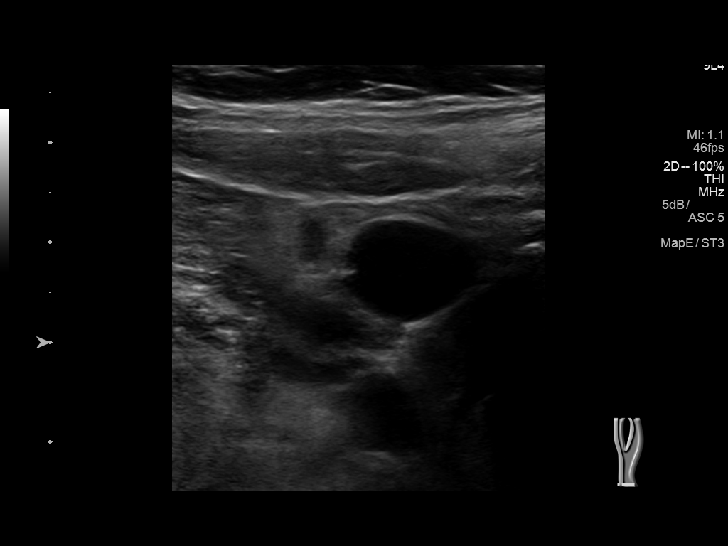
[im 7/80]
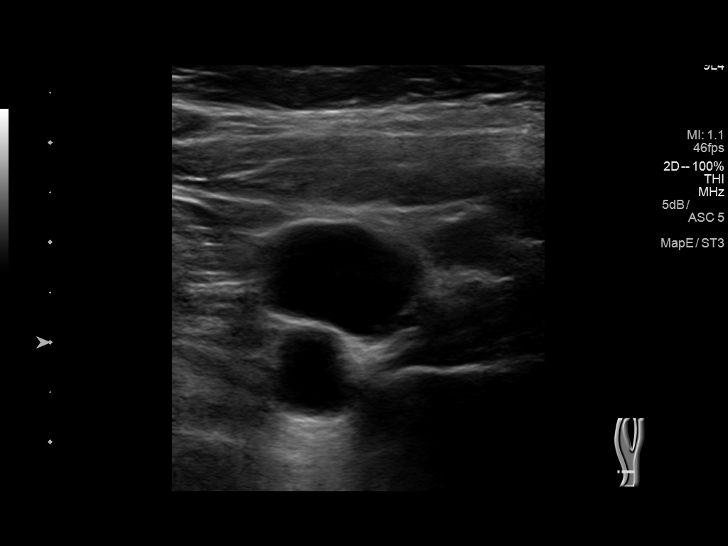
[im 14/80]
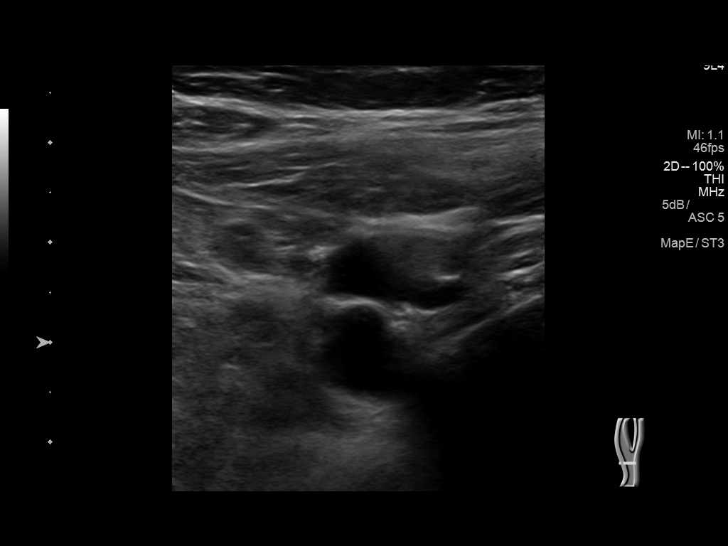
[im 21/80]
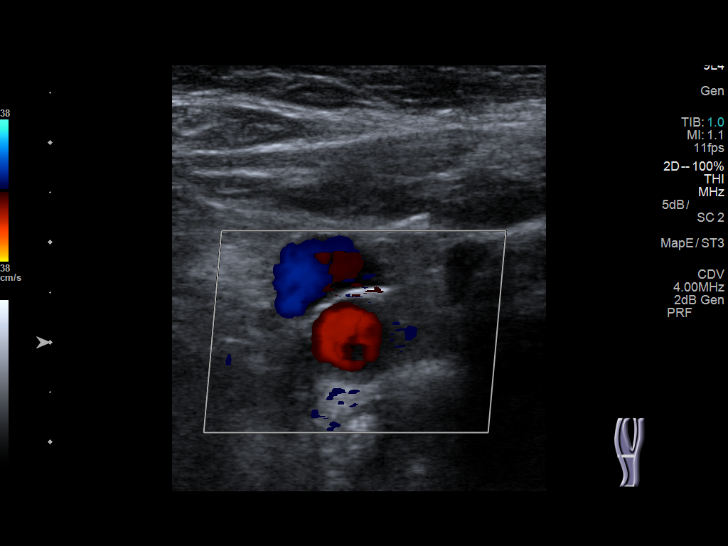
[im 28/80]
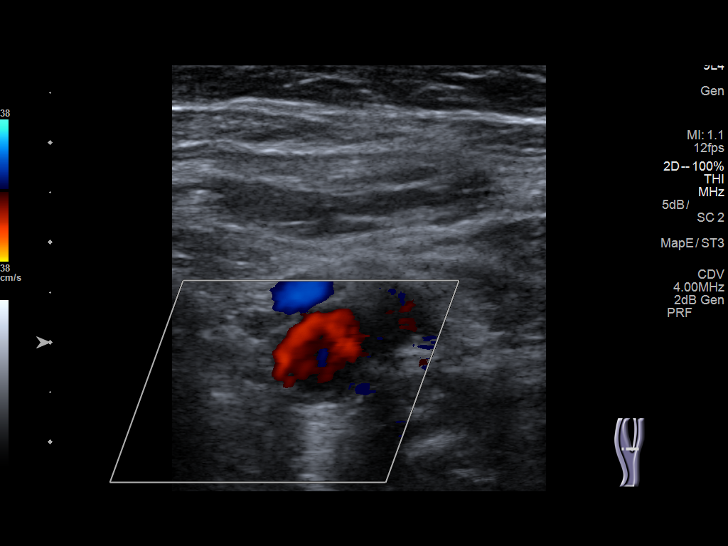
[im 35/80]
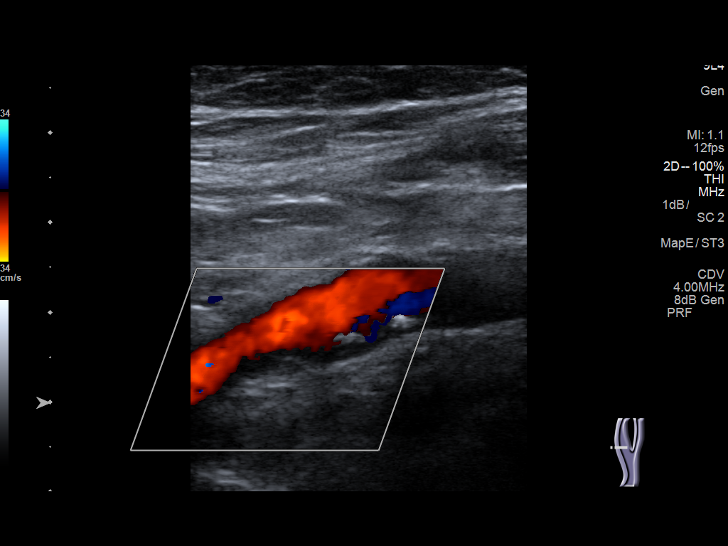
[im 42/80]
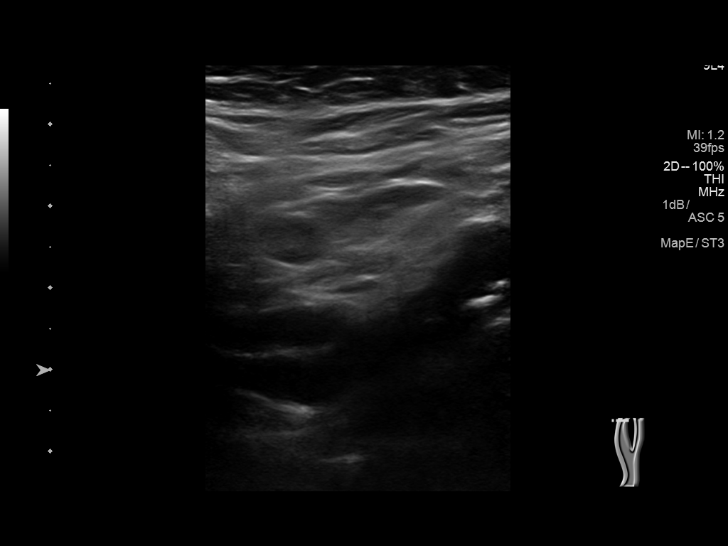
[im 45/80]
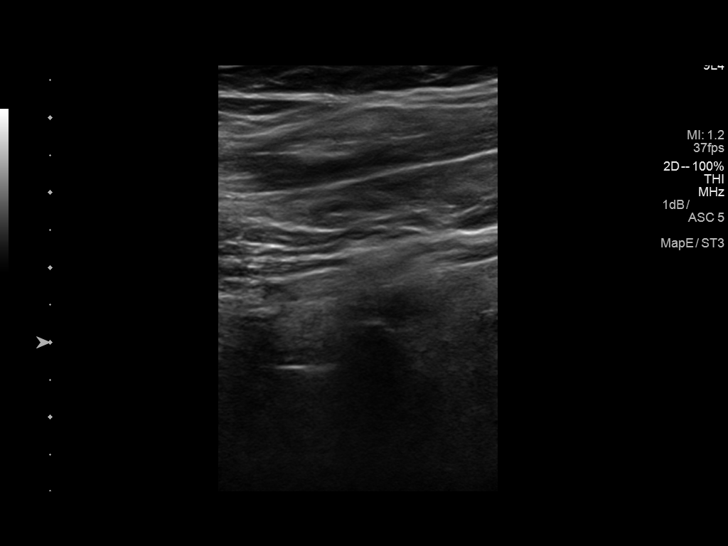
[im 52/80]
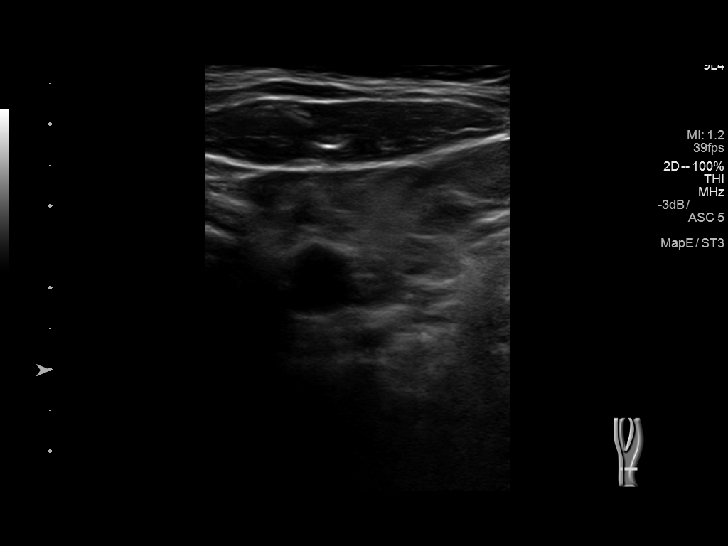
[im 59/80]
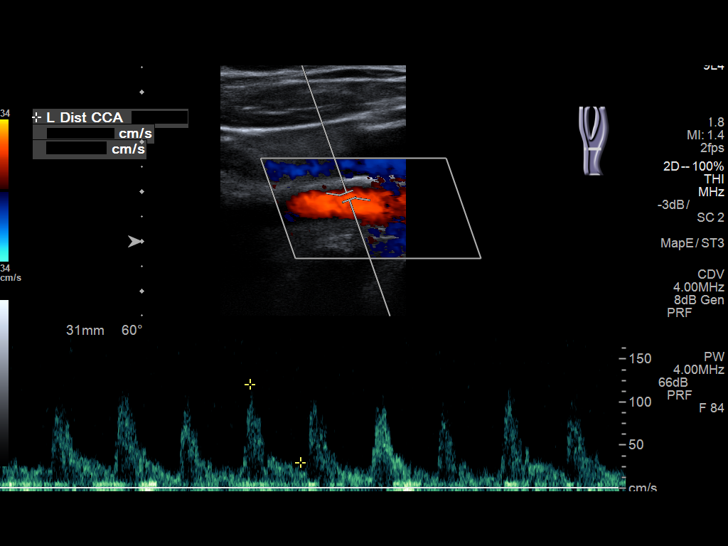
[im 66/80]
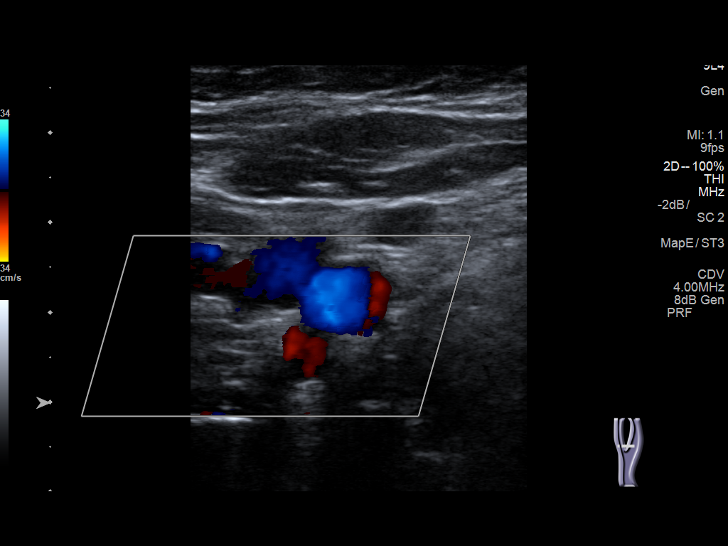
[im 73/80]
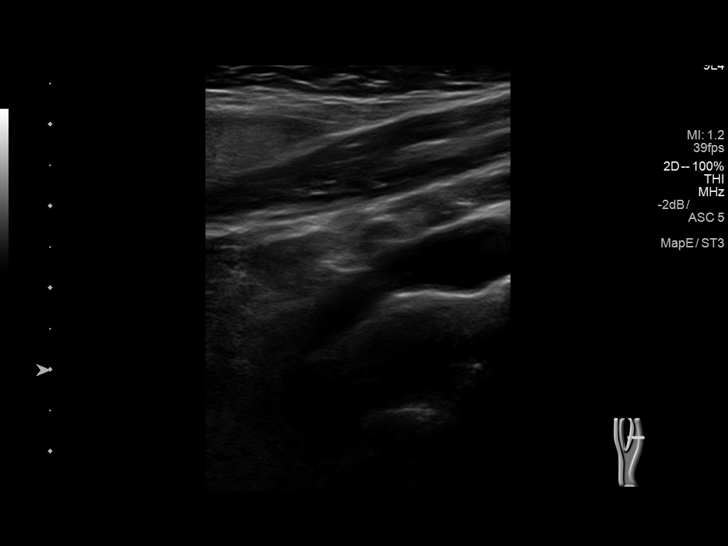
[im 80/80]
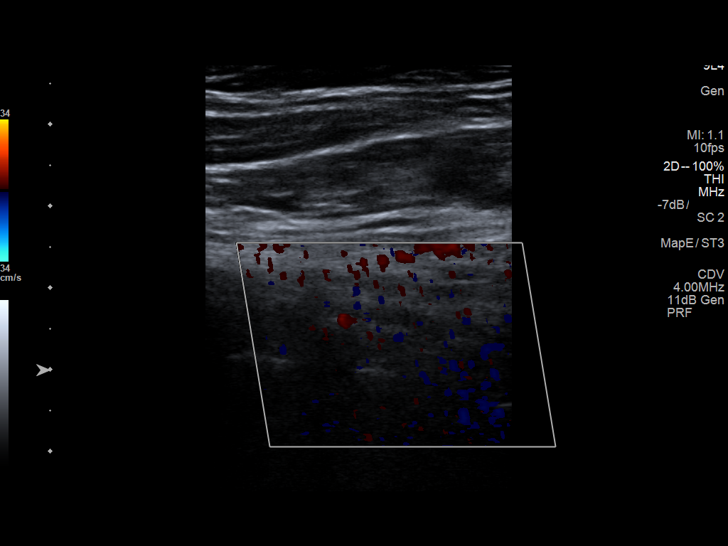

[13 of 24 positions shown; findings below may reference images not displayed]

FINDINGS: Criteria: Quantification of carotid stenosis is based on velocity
parameters that correlate the residual internal carotid diameter
with NASCET-based stenosis levels, using the diameter of the distal
internal carotid lumen as the denominator for stenosis measurement.

The following velocity measurements were obtained:

RIGHT

ICA:  127/22 cm/sec

CCA:  180/29 cm/sec

SYSTOLIC ICA/CCA RATIO:

DIASTOLIC ICA/CCA RATIO:

ECA:  172 cm/sec

LEFT

ICA:  137/43 cm/sec

CCA:  139/24 cm/sec

SYSTOLIC ICA/CCA RATIO:

DIASTOLIC ICA/CCA RATIO:

ECA:  194 cm/sec

RIGHT CAROTID ARTERY: Mild right carotid bifurcation atherosclerotic
vascular disease. No flow limiting stenosis. No evidence of
prominent spectral broadening.

RIGHT VERTEBRAL ARTERY:  Not visualized.

LEFT CAROTID ARTERY: Mild left carotid bifurcation atherosclerotic
vascular disease. No flow limiting stenosis. No evidence of
prominent spectral broadening .

LEFT VERTEBRAL ARTERY:  Not visualized.
IMPRESSION: 1. Mild bilateral carotid bifurcation atherosclerotic vascular
disease. No flow limiting stenosis. Degree of stenosis less than
50%.

2. Vertebral arteries are not visualized.

## 2017-12-23 IMAGING — CT CT HEAD W/O CM
1 series · 16 of 30 positions shown, 20 images · non-contrast
Comparison: 10/10/2015

CLINICAL DATA: Pt reports feeling "weak" x couple of days. Reports
hx of TIA's. Pt alert and oriented, answering all questions
appropriately. NAD.

EXAM:
CT HEAD WITHOUT CONTRAST
TECHNIQUE: Contiguous axial images were obtained from the base of the skull
through the vertex without intravenous contrast.

[Series 2: headseq 4.8 h37s · axial · 0.43mm/px · z∈[+91,+243]mm · 16 of 35 slices shown, 20 images]
[im 2/35  brain]
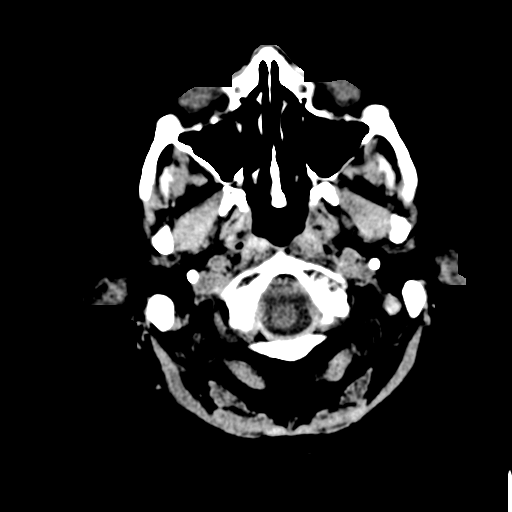
[im 2/35  bone]
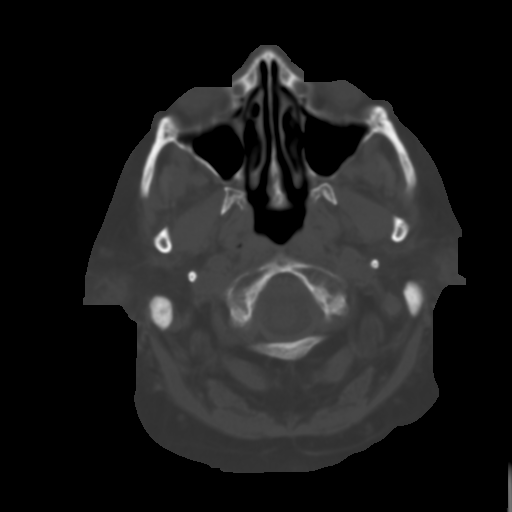
[im 4/35  brain]
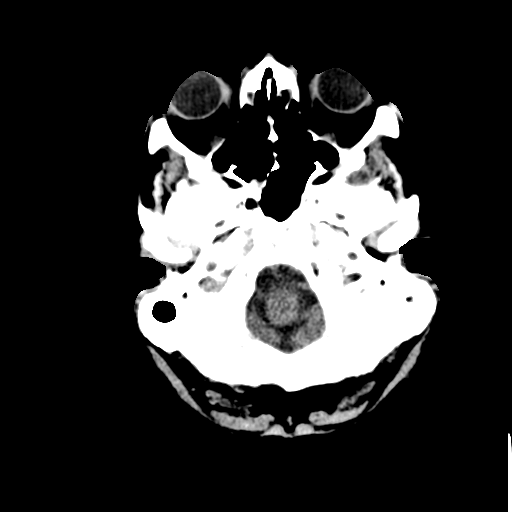
[im 6/35  brain]
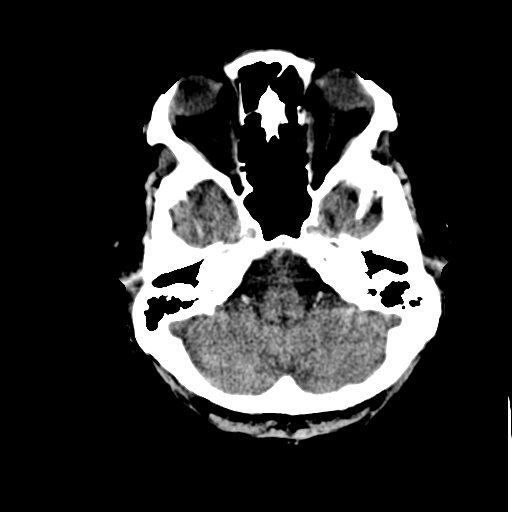
[im 9/35  brain]
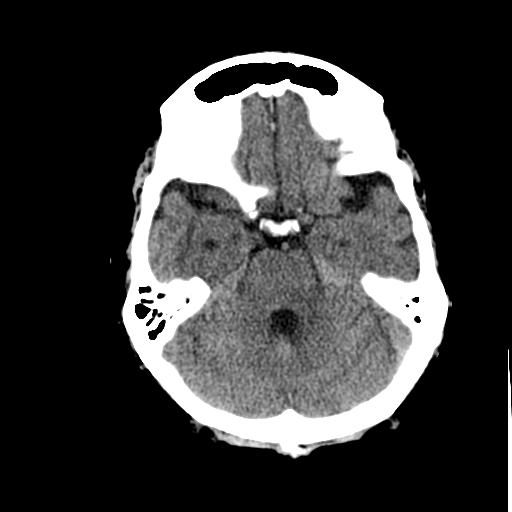
[im 10/35  brain]
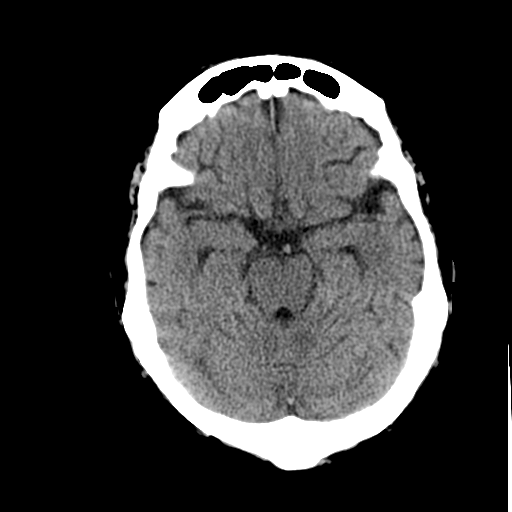
[im 10/35  bone]
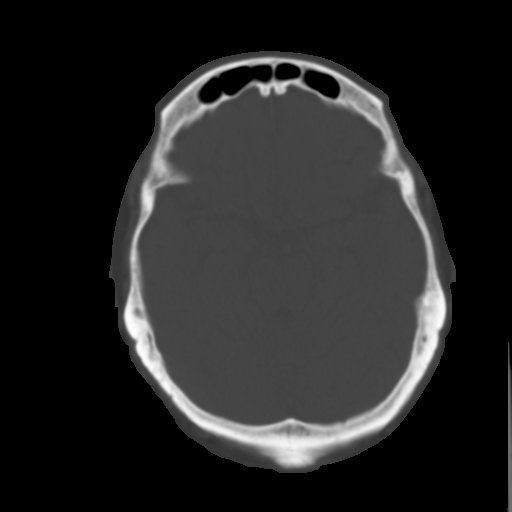
[im 12/35  brain]
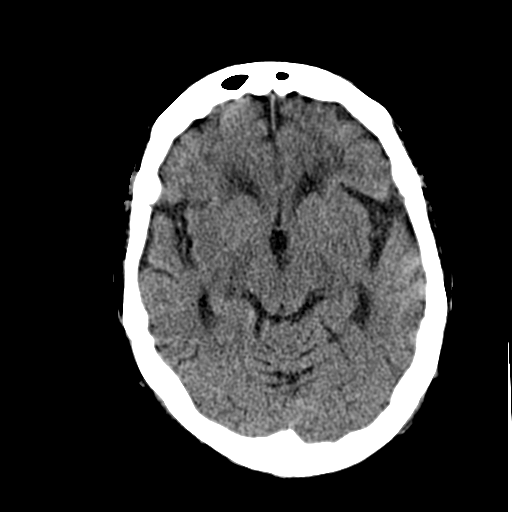
[im 15/35  brain]
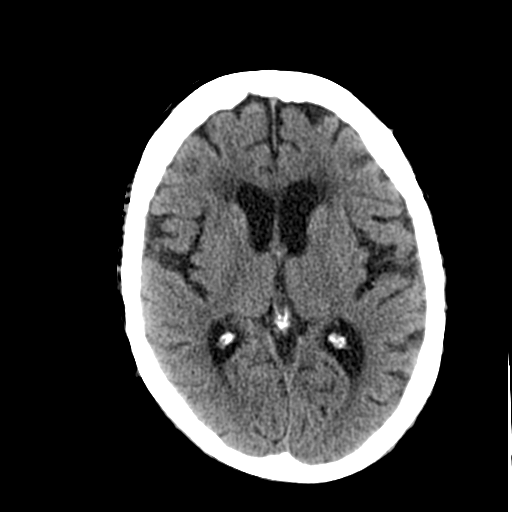
[im 17/35  brain]
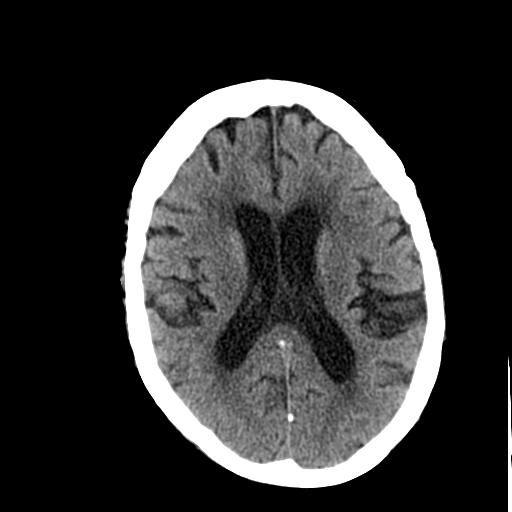
[im 18/35  brain]
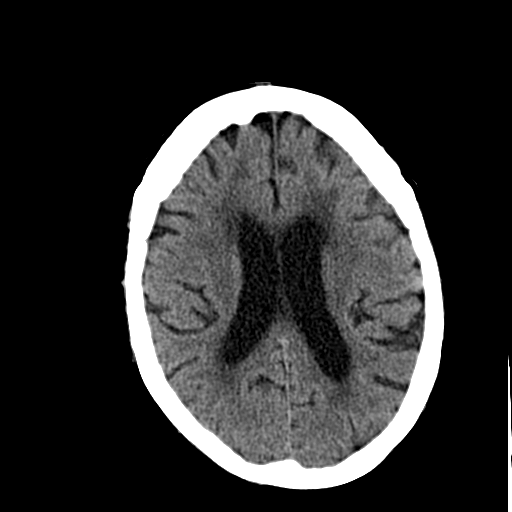
[im 18/35  bone]
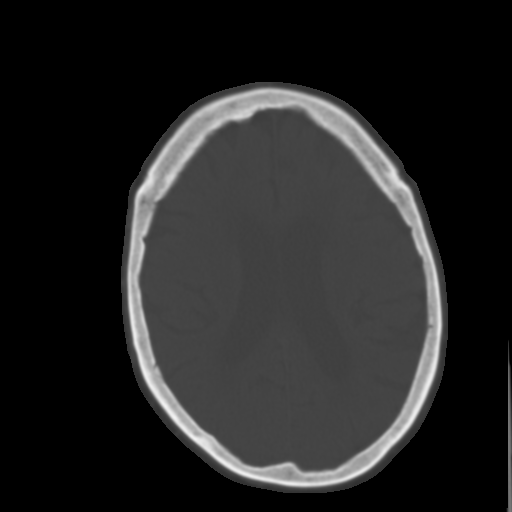
[im 20/35  brain]
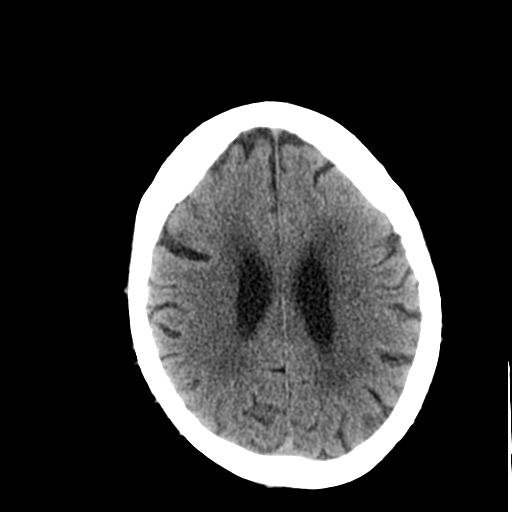
[im 23/35  brain]
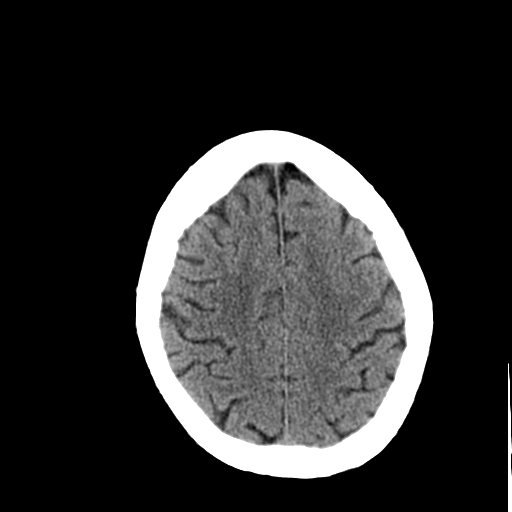
[im 25/35  brain]
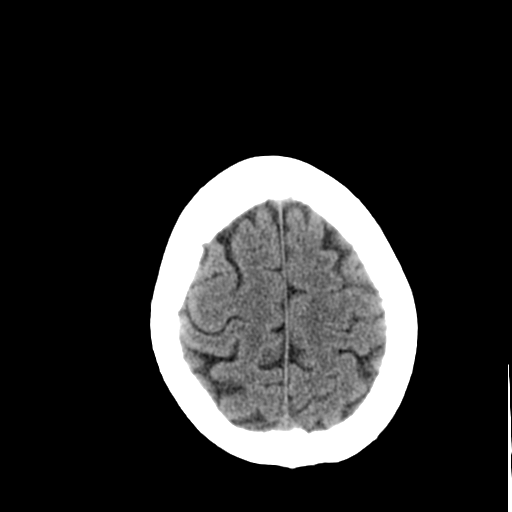
[im 26/35  brain]
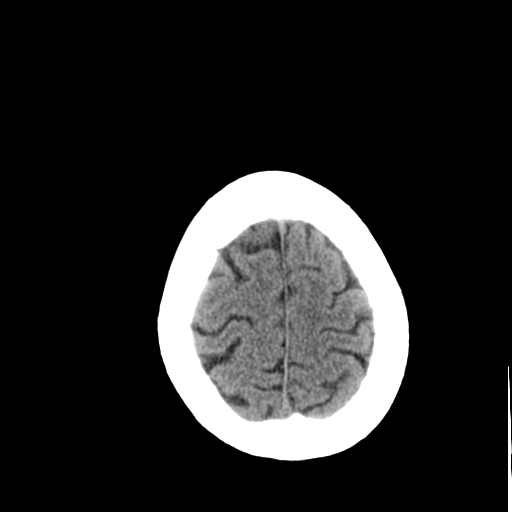
[im 26/35  bone]
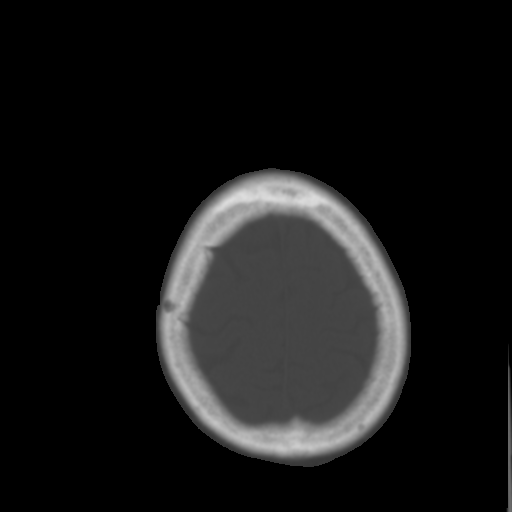
[im 29/35  brain]
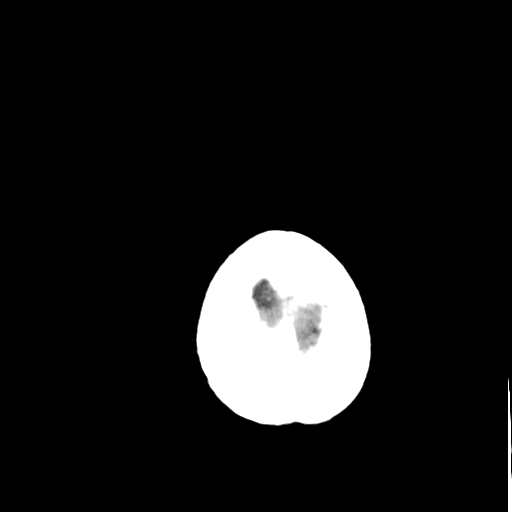
[im 31/35  brain]
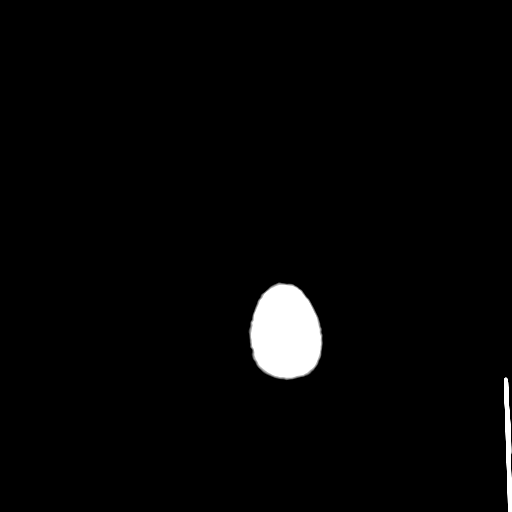
[im 33/35  brain]
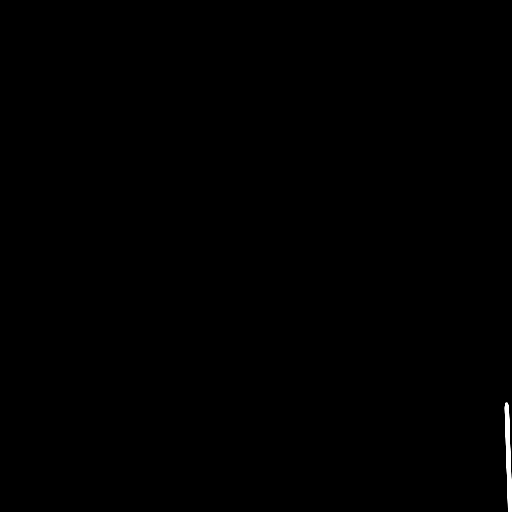

[16 of 30 positions shown; findings below may reference images not displayed]

FINDINGS: Sinuses/Soft tissues: Minimal fluid in the inferior aspect left
mastoid air cells is chronic. Other paranasal sinuses are clear.

Intracranial: Moderate low density in the periventricular white
matter likely related to small vessel disease. No mass lesion,
hemorrhage, hydrocephalus, acute infarct, intra-axial, or
extra-axial fluid collection.
IMPRESSION: 1.  No acute intracranial abnormality.
2. Moderate small vessel ischemic change.
3. Chronic small volume fluid in the inferior aspect of the left
mastoid air cells.

## 2018-02-28 ENCOUNTER — Emergency Department (HOSPITAL_COMMUNITY): Payer: Medicare HMO

## 2018-02-28 ENCOUNTER — Encounter (HOSPITAL_COMMUNITY): Payer: Self-pay | Admitting: Emergency Medicine

## 2018-02-28 ENCOUNTER — Other Ambulatory Visit: Payer: Self-pay

## 2018-02-28 ENCOUNTER — Emergency Department (HOSPITAL_COMMUNITY)
Admission: EM | Admit: 2018-02-28 | Discharge: 2018-02-28 | Disposition: A | Discharge status: Home / Self Care | Payer: Medicare HMO | Attending: Emergency Medicine | Admitting: Emergency Medicine

## 2018-02-28 DIAGNOSIS — Z794 Long term (current) use of insulin: Secondary | ICD-10-CM | POA: Insufficient documentation

## 2018-02-28 DIAGNOSIS — Z7982 Long term (current) use of aspirin: Secondary | ICD-10-CM | POA: Diagnosis not present

## 2018-02-28 DIAGNOSIS — I110 Hypertensive heart disease with heart failure: Secondary | ICD-10-CM | POA: Diagnosis not present

## 2018-02-28 DIAGNOSIS — I2510 Atherosclerotic heart disease of native coronary artery without angina pectoris: Secondary | ICD-10-CM | POA: Diagnosis not present

## 2018-02-28 DIAGNOSIS — I5032 Chronic diastolic (congestive) heart failure: Secondary | ICD-10-CM | POA: Insufficient documentation

## 2018-02-28 DIAGNOSIS — R0602 Shortness of breath: Secondary | ICD-10-CM | POA: Diagnosis present

## 2018-02-28 DIAGNOSIS — N390 Urinary tract infection, site not specified: Secondary | ICD-10-CM | POA: Diagnosis not present

## 2018-02-28 DIAGNOSIS — E039 Hypothyroidism, unspecified: Secondary | ICD-10-CM | POA: Diagnosis not present

## 2018-02-28 DIAGNOSIS — Z8673 Personal history of transient ischemic attack (TIA), and cerebral infarction without residual deficits: Secondary | ICD-10-CM | POA: Diagnosis not present

## 2018-02-28 DIAGNOSIS — J441 Chronic obstructive pulmonary disease with (acute) exacerbation: Secondary | ICD-10-CM | POA: Insufficient documentation

## 2018-02-28 DIAGNOSIS — I252 Old myocardial infarction: Secondary | ICD-10-CM | POA: Diagnosis not present

## 2018-02-28 DIAGNOSIS — E119 Type 2 diabetes mellitus without complications: Secondary | ICD-10-CM | POA: Insufficient documentation

## 2018-02-28 DIAGNOSIS — Z79899 Other long term (current) drug therapy: Secondary | ICD-10-CM | POA: Diagnosis not present

## 2018-02-28 DIAGNOSIS — E785 Hyperlipidemia, unspecified: Secondary | ICD-10-CM | POA: Diagnosis not present

## 2018-02-28 LAB — BASIC METABOLIC PANEL
Anion gap: 11 (ref 5–15)
BUN: 16 mg/dL (ref 6–20)
CHLORIDE: 106 mmol/L (ref 101–111)
CO2: 24 mmol/L (ref 22–32)
CREATININE: 0.78 mg/dL (ref 0.44–1.00)
Calcium: 9.3 mg/dL (ref 8.9–10.3)
GFR calc non Af Amer: >60 mL/min (ref >60)
Glucose, Bld: 176 mg/dL — ABNORMAL HIGH (ref 65–99)
Potassium: 4.3 mmol/L (ref 3.5–5.1)
Sodium: 141 mmol/L (ref 135–145)

## 2018-02-28 LAB — URINALYSIS, ROUTINE W REFLEX MICROSCOPIC
BILIRUBIN URINE: NEGATIVE
Glucose, UA: >=500 mg/dL — AB
Hgb urine dipstick: NEGATIVE
Ketones, ur: NEGATIVE mg/dL
Nitrite: NEGATIVE
PROTEIN: NEGATIVE mg/dL
Specific Gravity, Urine: 1.023 (ref 1.005–1.030)
pH: 5 (ref 5.0–8.0)

## 2018-02-28 LAB — BLOOD GAS, VENOUS
ACID-BASE DEFICIT: 0.4 mmol/L (ref 0.0–2.0)
BICARBONATE: 23.7 mmol/L (ref 20.0–28.0)
O2 SAT: 90.1 %
PATIENT TEMPERATURE: 37
PO2 VEN: 64.1 mmHg — AB (ref 32.0–45.0)
pCO2, Ven: 43.6 mmHg — ABNORMAL LOW (ref 44.0–60.0)
pH, Ven: 7.365 (ref 7.250–7.430)

## 2018-02-28 LAB — BRAIN NATRIURETIC PEPTIDE: B Natriuretic Peptide: 72 pg/mL (ref 0.0–100.0)

## 2018-02-28 LAB — CBC
HCT: 39.9 % (ref 36.0–46.0)
Hemoglobin: 12 g/dL (ref 12.0–15.0)
MCH: 23.9 pg — AB (ref 26.0–34.0)
MCHC: 30.1 g/dL (ref 30.0–36.0)
MCV: 79.5 fL (ref 78.0–100.0)
PLATELETS: 266 10*3/uL (ref 150–400)
RBC: 5.02 MIL/uL (ref 3.87–5.11)
RDW: 23.7 % — ABNORMAL HIGH (ref 11.5–15.5)
WBC: 9.1 10*3/uL (ref 4.0–10.5)

## 2018-02-28 LAB — TROPONIN I

## 2018-02-28 MED ORDER — ALBUTEROL SULFATE (2.5 MG/3ML) 0.083% IN NEBU: 5.0000 mg | INHALATION_SOLUTION | Freq: Once | RESPIRATORY_TRACT | Status: DC

## 2018-02-28 MED ORDER — LEVOFLOXACIN 500 MG PO TABS
500.0000 mg | ORAL_TABLET | Freq: Once | ORAL | Status: AC
  Administered 2018-02-28: 500 mg via ORAL
  Filled 2018-02-28: qty 1

## 2018-02-28 MED ORDER — LEVOFLOXACIN 500 MG PO TABS: 500.0000 mg | ORAL_TABLET | Freq: Every day | ORAL | 0 refills | Status: DC

## 2018-02-28 MED ORDER — METHYLPREDNISOLONE SODIUM SUCC 125 MG IJ SOLR
125.0000 mg | Freq: Once | INTRAMUSCULAR | Status: AC
  Administered 2018-02-28: 125 mg via INTRAVENOUS
  Filled 2018-02-28: qty 2

## 2018-02-28 MED ORDER — IPRATROPIUM-ALBUTEROL 0.5-2.5 (3) MG/3ML IN SOLN
3.0000 mL | Freq: Once | RESPIRATORY_TRACT | Status: AC
  Administered 2018-02-28: 3 mL via RESPIRATORY_TRACT
  Filled 2018-02-28: qty 3

## 2018-02-28 MED ORDER — IPRATROPIUM-ALBUTEROL 0.5-2.5 (3) MG/3ML IN SOLN
3.0000 mL | Freq: Four times a day (QID) | RESPIRATORY_TRACT | Status: DC
  Administered 2018-02-28: 3 mL via RESPIRATORY_TRACT
  Filled 2018-02-28: qty 3

## 2018-02-28 MED ORDER — IPRATROPIUM-ALBUTEROL 0.5-2.5 (3) MG/3ML IN SOLN: 3.0000 mL | Freq: Once | RESPIRATORY_TRACT | Status: DC

## 2018-02-28 MED ORDER — ACETAMINOPHEN 500 MG PO TABS
1000.0000 mg | ORAL_TABLET | Freq: Once | ORAL | Status: AC
  Administered 2018-02-28: 1000 mg via ORAL
  Filled 2018-02-28: qty 2

## 2018-02-28 NOTE — ED Triage Notes (Signed)
Patient complains of shortness of breath that started last night. Patient states She stats nothing makes it better or worse. Denies fevers/chills. States history of COPD.

## 2018-02-28 NOTE — ED Notes (Signed)
PT CAN NOT LEAVE UNTIL SHE EATS A MEAL TRAY, PER DR. MILLER

## 2018-02-28 NOTE — ED Notes (Signed)
Patient ambulated around nursing station o2 started at 93 and then maintained at [phone]

## 2018-02-28 NOTE — ED Notes (Signed)
Dr. Hyacinth MeekerMiller made of pt's o2 sats while ambulating.

## 2018-02-28 NOTE — ED Notes (Signed)
Pt c/o burning with urination, MD notified and UA being sent to lab

## 2018-02-28 NOTE — Discharge Instructions (Signed)
Continue prednisone Levaquin once daily for 7 days Continue Albuterol every 4 hours as needed for Shortness of Breath / coughing and Wheezing You may help get the phlegm up with Mucinex  Please obtain all of your results from medical records or have your doctors office obtain the results - share them with your doctor - you should be seen at your doctors office in the next 2 days. Call today to arrange your follow up. Take the medications as prescribed. Please review all of the medicines and only take them if you do not have an allergy to them. Please be aware that if you are taking birth control pills, taking other prescriptions, ESPECIALLY ANTIBIOTICS may make the birth control ineffective - if this is the case, either do not engage in sexual activity or use alternative methods of birth control such as condoms until you have finished the medicine and your family doctor says it is OK to restart them. If you are on a blood thinner such as COUMADIN, be aware that any other medicine that you take may cause the coumadin to either work too much, or not enough - you should have your coumadin level rechecked in next 7 days if this is the case.  ?  It is also a possibility that you have an allergic reaction to any of the medicines that you have been prescribed - Everybody reacts differently to medications and while MOST people have no trouble with most medicines, you may have a reaction such as nausea, vomiting, rash, swelling, shortness of breath. If this is the case, please stop taking the medicine immediately and contact your physician.  ?  You should return to the ER if you develop severe or worsening symptoms.

## 2018-02-28 NOTE — ED Notes (Signed)
RT notified of neb tx.

## 2018-02-28 NOTE — ED Notes (Signed)
Respiratory was made aware that this pt needs a breathing treatment.

## 2018-02-28 NOTE — ED Provider Notes (Signed)
[facility] EMERGENCY DEPARTMENT Provider Note   CSN: [id number] Arrival date & time: 02/28/18  1641     History   Chief Complaint Chief Complaint  Patient presents with  . Shortness of Breath    HPI [name] [name] is a 82 y.o. female.  HPI  The patient is a 82 year old female, she has a known history of multivessel coronary disease status post bypass grafting, history of COPD, diastolic congestive heart failure, hypertension and a history of a stroke.  She currently lives by herself, walks with a cane intermittently because of chronic dizziness and has had worsening shortness of breath over the last 4 days.  She was treated at her primary doctor's office with steroids and increased amounts of bronchodilator therapy but states that despite taking prednisone for the last 2 days and 3 nebulized treatments today of albuterol she continues to be short of breath wheezing and dyspneic.  This gets worse with ambulation, it is not associated with fevers but she is persistently coughing and feeling like she cannot sleep or talk because of frequent coughing spells.  She denies increased amounts of swelling in the legs and denies a chest heaviness or radiation of pain to her arms back or shoulders.  Sx are constant, gradually worsening.  Past Medical History:  Diagnosis Date  . Arthritis   . CAD (coronary artery disease)    Multivessel status post CABG 2001  . Chronic obstructive pulmonary disease (HCC)   . DDD (degenerative disc disease), lumbar   . Diastolic CHF (HCC)   . Essential hypertension   . Hematuria   . History of CVA (cerebrovascular accident)    2013 - lacunar infarct right thalamus - residual left side of  lip numb  and left eye vision worse (which corrented lens implant from cataract extraction)  . History of MI (myocardial infarction)    July 2001  . Hyperlipidemia   . Hypothyroidism   . Insulin dependent type 2 diabetes mellitus (HCC)   . Lesion of bladder   . Lower  urinary tract symptoms (LUTS)   . OSA (obstructive sleep apnea)   . Peripheral neuropathy   . Psoriasis   . SUI (stress urinary incontinence, female)   . Varicose veins     Patient Active Problem List   Diagnosis Date Noted  . Anemia 09/29/2017  . Atypical chest pain 09/29/2017  . Diastolic dysfunction 09/29/2017  . CAD (coronary artery disease) 09/28/2017  . Acute lower UTI 09/28/2017  . Hyperkalemia 09/28/2017  . Ischemic stroke (HCC) 05/10/2017  . Acute ischemic stroke (HCC) 05/10/2017  . Stroke (HCC) 08/01/2016  . Dysarthria 03/18/2016  . Influenza-like illness 11/19/2015  . Anemia of chronic disease 11/19/2015  . OSA (obstructive sleep apnea) 11/19/2015  . Left facial numbness 08/29/2015  . Gait instability 06/26/2015  . Bronchitis 11/13/2014  . COPD exacerbation (HCC) 11/13/2014  . UTI (urinary tract infection) 08/26/2014  . Near syncope 08/24/2014  . UTI (lower urinary tract infection) 12/10/2013  . Hypoxia 12/10/2013  . Weakness generalized 12/10/2013  . TIA (transient ischemic attack) 06/22/2012  . Left-sided headache 06/22/2012  . Noncompliance 06/22/2012  . Noncompliance with CPAP treatment 06/22/2012  . Psoriasis 01/07/2012  . Cerebrovascular disease   . Chronic back pain   . Chronic obstructive pulmonary disease (HCC)   . Chest pain 08/28/2011  . DM type 2 causing complication (HCC) 02/14/2010  . Obesity 02/14/2010  . Arteriosclerotic cardiovascular disease (ASCVD) 02/14/2010  . Hypothyroidism 02/06/2010  . Hyperlipidemia 02/06/2010  .  Essential hypertension 02/06/2010  . Sleep apnea 02/06/2010    Past Surgical History:  Procedure Laterality Date  . APPENDECTOMY  age 89  . CARDIAC CATHETERIZATION  1991   No sig. cad  . CARDIAC CATHETERIZATION  07-01-2000   high grade in-stent restenosis  . CARDIAC CATHETERIZATION  04-30-2001   single native vessel cad/ graft patent  . CARDIAC CATHETERIZATION  02-13-2006  dr Jenness Cornerbrodie   2V cad with total occluded  LAD  and  70% ostial of small ramus/  grafts patent/  normal ef  . CARDIAC CATHETERIZATION  01-20-2000  dr Clifton Jamesmcalhany   essentially unchanged;  severe single vessel cad with diffuse disease throughout CFX and RCA/ ef 66%;  chf with preserved LVSF  . CARDIOVASCULAR STRESS TEST  08-28-2011   DR ROTHBART   NEGATIVE NUCLEAR STUDY/  GLOBAL LVSF/  EF 65%  . CARPAL TUNNEL RELEASE Bilateral yrs ago  . CATARACT EXTRACTION W/ INTRAOCULAR LENS  IMPLANT, BILATERAL  2013  . CHOLECYSTECTOMY  1990  . COLONOSCOPY N/A 07/26/2015   Procedure: COLONOSCOPY;  Surgeon: Malissa HippoNajeeb U Rehman, MD;  Location: [facility];  Service: Endoscopy;  Laterality: N/A;  1200  . CORONARY ANGIOPLASTY  11-13-1999   balloon angioplasty to mLAD , d2 of LAD  . CORONARY ANGIOPLASTY WITH STENT PLACEMENT  01-17-2000   PCI stenting to mLAD & D2 of LAD  . CORONARY ARTERY BYPASS GRAFT  07-02-2000   DR Tressie StalkerLARENCE OWEN   SVG to diagonal, LIMA to LAD  . CYSTOSCOPY W/ RETROGRADES Bilateral 08/09/2014   Procedure: CYSTOSCOPY, BILATERAL RETROGRADE, HYDRODISTENSION, BLADDER BIOPSY WITH FULGERATION, INSTILL PYRIDIUM AND MARCAINE ;  Surgeon: Anner CreteJohn J Wrenn, MD;  [facility];  Service: Urology;  Laterality: Bilateral;  . KNEE ARTHROSCOPY Left 10-18-2003   SYNOVECTOMY  . LUMBAR SPINE SURGERY  x2    yrs ago  . ORIF HUMERUS FRACTURE Right 04-18-2008  . REPAIR FLEXOR TENDON HAND Right 09-20-2008   CARPI RADIALIS TENDON TRANSFER TO EXTENSOR TO INDEX, MIDDLE, RING , LITTLE FINGERS AND DIGITI MININI  . ROTATOR CUFF REPAIR Right 1995  . TOTAL ABDOMINAL HYSTERECTOMY W/ BILATERAL SALPINGOOPHORECTOMY  age 82  . TRANSTHORACIC ECHOCARDIOGRAM  12-12-2013   MILD LVH/  EF 60-65%/  GRADE I DIASTOLIC DYSFUNCTION/  MILD LAE    OB History    Gravida Para Term Preterm AB Living   7 7 7     4    SAB TAB Ectopic Multiple Live Births                   Home Medications    Prior to Admission medications   Medication Sig Start Date End Date  Taking? Authorizing Provider  amLODipine (NORVASC) 5 MG tablet Take 5 mg by mouth daily. 07/03/17  Yes [provider]  aspirin EC 325 MG tablet Take 325 mg by mouth daily.   Yes [provider]  ferrous sulfate 325 (65 FE) MG tablet Take 1 tablet (325 mg total) by mouth 3 (three) times daily with meals. 10/01/17  Yes [name], Gerlene Burdockichard, MD  gabapentin (NEURONTIN) 300 MG capsule Take 300 mg by mouth 2 (two) times daily. 09/18/17  Yes [provider]  insulin NPH-regular Human (NOVOLIN 70/30) (70-30) 100 UNIT/ML injection Inject 40 Units into the skin 2 (two) times daily.   Yes [provider]  ipratropium-albuterol (DUONEB) 0.5-2.5 (3) MG/3ML SOLN Inhale 3 mLs into the lungs 4 (four) times daily as needed. 09/19/17  Yes [provider]  levothyroxine (SYNTHROID,  LEVOTHROID) 25 MCG tablet Take 25 mcg by mouth daily.    Yes [provider]  lisinopril (PRINIVIL,ZESTRIL) 10 MG tablet Take 10 mg by mouth daily.   Yes [provider]  LORazepam (ATIVAN) 1 MG tablet Take 1-2 mg by mouth daily as needed. 02/25/18  Yes [provider]  metFORMIN (GLUCOPHAGE) 500 MG tablet Take 500 mg by mouth 2 (two) times daily with a meal.     Yes [provider]  nitroGLYCERIN (NITROSTAT) 0.4 MG SL tablet Place 0.4 mg under the tongue every 5 (five) minutes as needed for chest pain.    Yes [provider]  Oxycodone HCl 10 MG TABS Take 10 mg by mouth every 4 (four) hours. 02/20/18  Yes [provider]  VITAMIN D, ERGOCALCIFEROL, PO Take 1 capsule by mouth daily.   Yes [provider]  ciprofloxacin (CIPRO) 500 MG tablet Take 1 tablet (500 mg total) by mouth 2 (two) times daily. Patient not taking: Reported on 02/28/2018 10/01/17   Oval Linsey, MD  fluticasone furoate-vilanterol (BREO ELLIPTA) 100-25 MCG/INH AEPB Inhale 1 puff into the lungs daily. Patient not taking: Reported on 02/28/2018 10/02/17   Oval Linsey,  MD  levofloxacin (LEVAQUIN) 500 MG tablet Take 1 tablet (500 mg total) by mouth daily. 02/28/18   Eber Hong, MD    Family History Family History  Problem Relation Age of Onset  . Hypertension Mother   . Stroke Mother   . Coronary artery disease Unknown        Multiple first and second-degree relatives  . Aneurysm Unknown        Cerebral circulation  . Colon cancer Sister   . Stroke Sister   . Coronary artery disease Sister   . Clotting disorder Unknown        Children diagnosed with hypercoagulable state  . Coronary artery disease Brother     Social History Social History   Tobacco Use  . Smoking status: Never Smoker  . Smokeless tobacco: Never Used  Substance Use Topics  . Alcohol use: No  . Drug use: No     Allergies   Codeine   Review of Systems Review of Systems  All other systems reviewed and are negative.    Physical Exam Updated Vital Signs BP (!) 128/55 (BP Location: Right Arm)   Pulse 85   Temp 98.7 F (37.1 C) (Oral)   Resp 20   Ht 5\' 4"  (1.626 m)   Wt 81.6 kg (180 lb)   SpO2 93%   BMI 30.90 kg/m   Physical Exam  Constitutional: She appears well-developed and well-nourished. She appears distressed.  HENT:  Head: Normocephalic and atraumatic.  Mouth/Throat: Oropharynx is clear and moist. No oropharyngeal exudate.  Eyes: Conjunctivae and EOM are normal. Pupils are equal, round, and reactive to light. Right eye exhibits no discharge. Left eye exhibits no discharge. No scleral icterus.  Neck: Normal range of motion. Neck supple. No JVD present. No thyromegaly present.  Cardiovascular: Normal rate, regular rhythm, normal heart sounds and intact distal pulses. Exam reveals no gallop and no friction rub.  No murmur heard. Pulmonary/Chest: She is in respiratory distress. She has wheezes. She has no rales.  Increased work of breathing, accessory muscle use, diffuse expiratory wheezing with prolonged expiratory phase, speaks in shortened sentences,  no rales frequent coughing  Abdominal: Soft. Bowel sounds are normal. She exhibits no distension and no mass. There is no tenderness.  Musculoskeletal: Normal range of motion. She exhibits edema.  She exhibits no tenderness.  Slight edema of the right lower extremity status post bypass grafting, no edema on the left lower extremity  Lymphadenopathy:    She has no cervical adenopathy.  Neurological: She is alert. Coordination normal.  Skin: Skin is warm and dry. No rash noted. No erythema.  Psychiatric: She has a normal mood and affect. Her behavior is normal.  Nursing note and vitals reviewed.    ED Treatments / Results  Labs (all labs ordered are listed, but only abnormal results are displayed) Labs Reviewed  BASIC METABOLIC PANEL - Abnormal; Notable for the following components:      Result Value   Glucose, Bld 176 (*)    All other components within normal limits  CBC - Abnormal; Notable for the following components:   MCH 23.9 (*)    RDW 23.7 (*)    All other components within normal limits  BLOOD GAS, VENOUS - Abnormal; Notable for the following components:   pCO2, Ven 43.6 (*)    pO2, Ven 64.1 (*)    All other components within normal limits  URINALYSIS, ROUTINE W REFLEX MICROSCOPIC - Abnormal; Notable for the following components:   APPearance HAZY (*)    Glucose, UA >=500 (*)    Leukocytes, UA SMALL (*)    Bacteria, UA MANY (*)    Squamous Epithelial / LPF 0-5 (*)    All other components within normal limits  URINE CULTURE  TROPONIN I  BRAIN NATRIURETIC PEPTIDE    EKG  EKG Interpretation  Date/Time:  Saturday February 28 2018 17:17:28 EST Ventricular Rate:  78 PR Interval:    QRS Duration: 105 QT Interval:  394 QTC Calculation: 449 R Axis:   -57 Text Interpretation:  Sinus rhythm Left anterior fascicular block LVH with secondary repolarization abnormality Anterior infarct, old since last tracing no significant change Confirmed by Eber Hong ([id number]) on 02/28/2018  5:23:08 PM       Radiology Dg Chest 2 View  Result Date: 02/28/2018 CLINICAL DATA:  Dyspnea and wheezing EXAM: CHEST - 2 VIEW COMPARISON:  09/28/2017 chest radiograph. FINDINGS: Partially visualized surgical hardware in the proximal right humerus. Intact sternotomy wires. CABG clips overlie the mediastinum. Stable cardiomediastinal silhouette with top-normal heart size. No pneumothorax. No pleural effusion. Stable mild scarring at the left lung apex. No pulmonary edema. No acute consolidative airspace disease. IMPRESSION: No active cardiopulmonary disease. Electronically Signed   By: Delbert Phenix M.D.   On: 02/28/2018 19:00    Procedures Procedures (including critical care time)  Medications Ordered in ED Medications  ipratropium-albuterol (DUONEB) 0.5-2.5 (3) MG/3ML nebulizer solution 3 mL (3 mLs Nebulization Given 02/28/18 1933)  ipratropium-albuterol (DUONEB) 0.5-2.5 (3) MG/3ML nebulizer solution 3 mL (3 mLs Nebulization Not Given 02/28/18 1940)  acetaminophen (TYLENOL) tablet 1,000 mg (not administered)  levofloxacin (LEVAQUIN) tablet 500 mg (not administered)  ipratropium-albuterol (DUONEB) 0.5-2.5 (3) MG/3ML nebulizer solution 3 mL (3 mLs Nebulization Given 02/28/18 1808)  methylPREDNISolone sodium succinate (SOLU-MEDROL) 125 mg/2 mL injection 125 mg (125 mg Intravenous Given 02/28/18 1749)     Initial Impression / Assessment and Plan / ED Course  I have reviewed the triage vital signs and the nursing notes.  Pertinent labs & imaging results that were available during my care of the patient were reviewed by me and considered in my medical decision making (see chart for details).      mental status is normal, I do not think she has hypercapnia, she definitely has pathology on exam that would  be consistent with acute COPD exacerbation however with her history of congestive heart failure and lack of response to steroids it does raise the suspicion for congestive heart failure as well.   X-ray pending, troponin and EKG as well.  EKG shows no acute findings   UTI present on UA - culture sent  Levaquin to tx the UTI and tx for COPD exacerbation  After 2 Duoneb treatments she feels much better  Ambulated without desaturation  Has prednisone  Xray neg  Pt stable for d/c.  Final Clinical Impressions(s) / ED Diagnoses   Final diagnoses:  Urinary tract infection without hematuria, site unspecified  COPD exacerbation [facility])    ED Discharge Orders        Ordered    levofloxacin (LEVAQUIN) 500 MG tablet  Daily     02/28/18 2034       Eber Hong, MD 02/28/18 2036

## 2018-03-04 LAB — URINE CULTURE

## 2018-03-05 ENCOUNTER — Telehealth: Payer: Self-pay | Admitting: *Deleted

## 2018-03-05 NOTE — Telephone Encounter (Signed)
Post ED Visit - Positive Culture Follow-up: Unsuccessful Patient Follow-up  Culture assessed and recommendations reviewed by:  []  Enzo BiNathan Batchelder, Pharm.D. []  Celedonio MiyamotoJeremy Frens, Pharm.D., BCPS AQ-ID []  Garvin FilaMike Maccia, Pharm.D., BCPS []  Georgina PillionElizabeth Martin, [address]Pharm[address]D., BCPS []  KearnyMinh Pham, [place]arm.D., BCPS, AAHIVP []  Estella HuskMichelle Turner, Pharm.D., BCPS, AAHIVP []  [name] Pearlachel Rumbarger, PharmD, BCPS []  Casilda Carlsaylor Stone, PharmD, BCPS []  Pollyann SamplesAndy Johnston, PharmD, BCPS  Positive urine culture, reviewed by Arthor CaptainAbigail Harris, PA-C  []  Patient discharged without antimicrobial prescription and treatment is now indicated []  Organism is resistant to prescribed ED discharge antimicrobial []  Patient with positive blood cultures   Unable to contact patient after 3 attempts, letter will be sent to address on file  [name] [name], [name] [name] 03/05/2018, 9:41 AM

## 2018-03-05 NOTE — Progress Notes (Signed)
ED Antimicrobial Stewardship Positive Culture Follow Up   [name] [name] is an 82 y.o. female [facility] with a chief complaint of SOB and burning with urination.   Chief Complaint  Patient presents with  . Shortness of Breath    Recent Results (from the past 720 hour(s))  Urine Culture     Status: Abnormal   Collection Time: 02/28/18  6:03 PM  Result Value Ref Range Status   Specimen Description   Final    URINE, CLEAN CATCH Performed at [facility], [address] [place], [place] [address]    Special Requests   Final    NONE Performed at [facility], [address] [place], [place] [address]    Culture >=100,000 COLONIES/mL ESCHERICHIA COLI (A)  Final   Report Status 03/04/2018 FINAL  Final   Organism ID, Bacteria ESCHERICHIA COLI (A)  Final      Susceptibility   Escherichia coli - MIC*    AMPICILLIN >=32 RESISTANT Resistant     CEFAZOLIN 8 SENSITIVE Sensitive     CEFTRIAXONE <=1 SENSITIVE Sensitive     CIPROFLOXACIN >=4 RESISTANT Resistant     GENTAMICIN <=1 SENSITIVE Sensitive     IMIPENEM <=0.25 SENSITIVE Sensitive     NITROFURANTOIN <=16 SENSITIVE Sensitive     TRIMETH/SULFA >=320 RESISTANT Resistant     AMPICILLIN/SULBACTAM >=32 RESISTANT Resistant     PIP/TAZO <=4 SENSITIVE Sensitive     Extended ESBL NEGATIVE Sensitive     * >=100,000 COLONIES/mL ESCHERICHIA COLI    [x]  Treated with levofloxacin, organism resistant to prescribed antimicrobial  New antibiotic prescription: Check and see if patient still has urinary symptoms. If so, start Keflex 500 mg BID for 7 days.   ED Provider: Arthor CaptainAbigail Harris. PA-C    [name] [name], PharmD Pharmacy Resident Pager: [phone]

## 2018-03-12 ENCOUNTER — Telehealth: Payer: Self-pay | Admitting: *Deleted

## 2018-03-12 NOTE — Telephone Encounter (Signed)
Contacted by patient in response to letter sent to address on file.  Continues with symptoms of UTI.  Keflex 500mg  PO BID x 7 days called to [facility], [place] [phone].  Arthor CaptainAbigail Harris, PA-C

## 2018-03-23 IMAGING — CT CT HEAD W/O CM
1 series · 15 of 30 positions shown, 19 images · non-contrast
Comparison: December 19, 2015

CLINICAL DATA: Altered mental status

EXAM:
CT HEAD WITHOUT CONTRAST
TECHNIQUE: Contiguous axial images were obtained from the base of the skull
through the vertex without intravenous contrast.

[Series 2: headtrauma 4.8 h37s · axial · 0.43mm/px · z∈[-5,+152]mm · 15 of 36 slices shown, 19 images]
[im 2/36  brain]
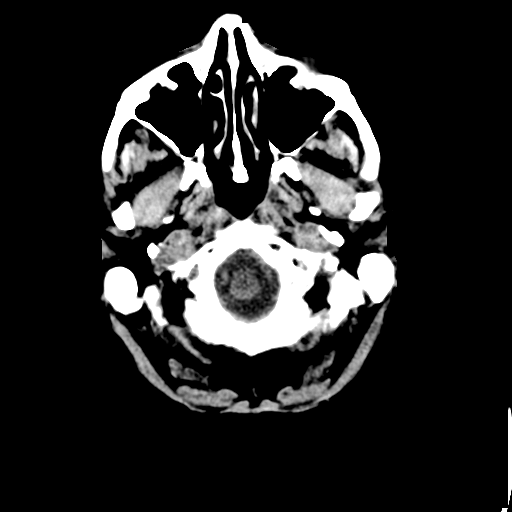
[im 2/36  bone]
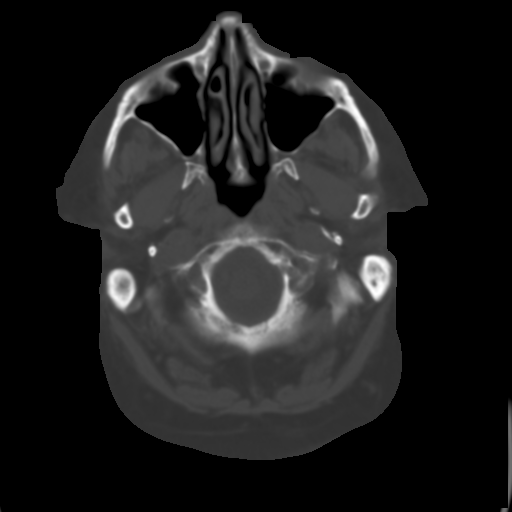
[im 4/36  brain]
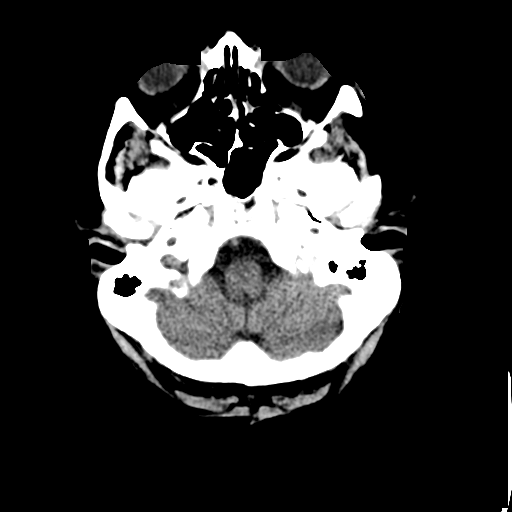
[im 7/36  brain]
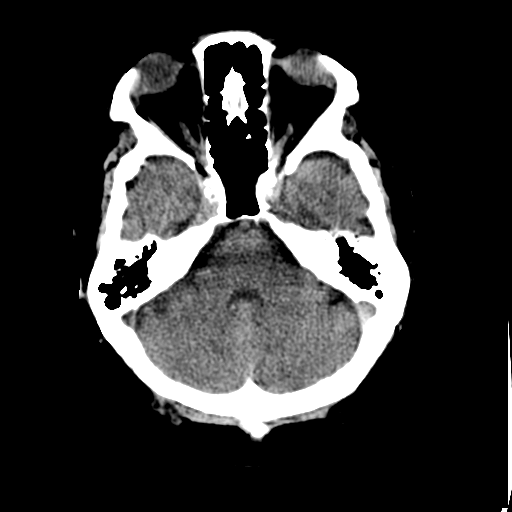
[im 9/36  brain]
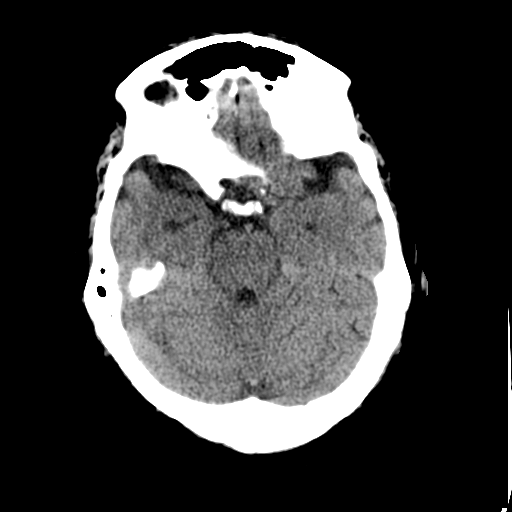
[im 11/36  brain]
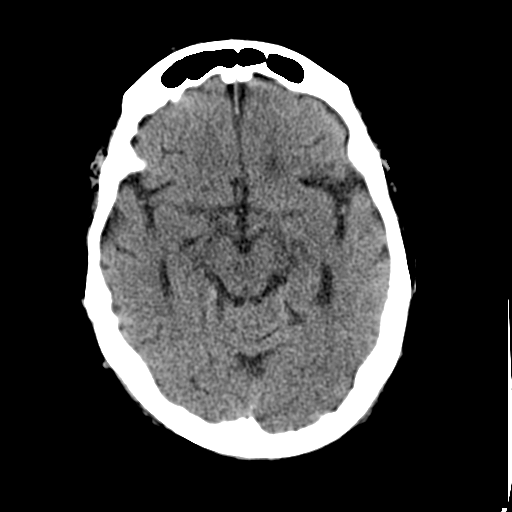
[im 11/36  bone]
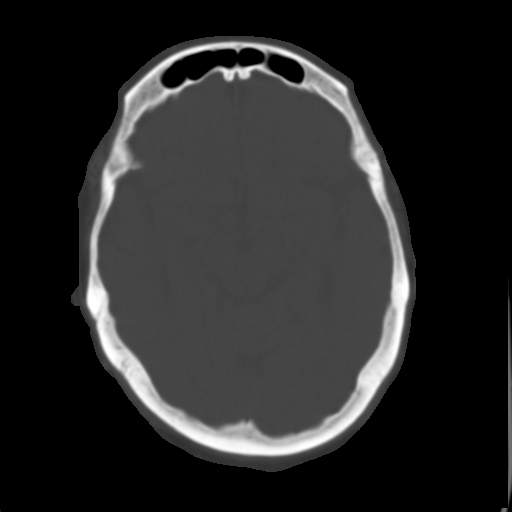
[im 14/36  brain]
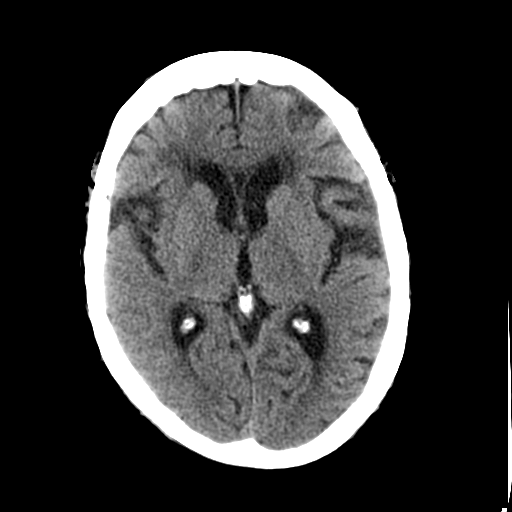
[im 16/36  brain]
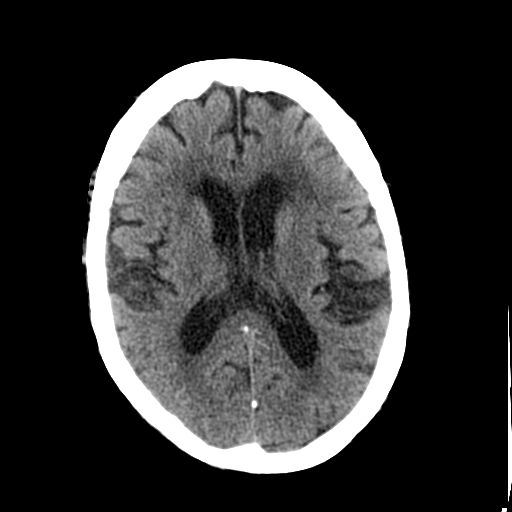
[im 19/36  brain]
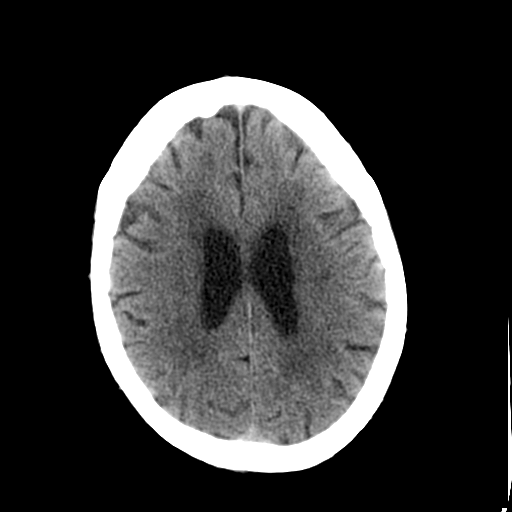
[im 20/36  brain]
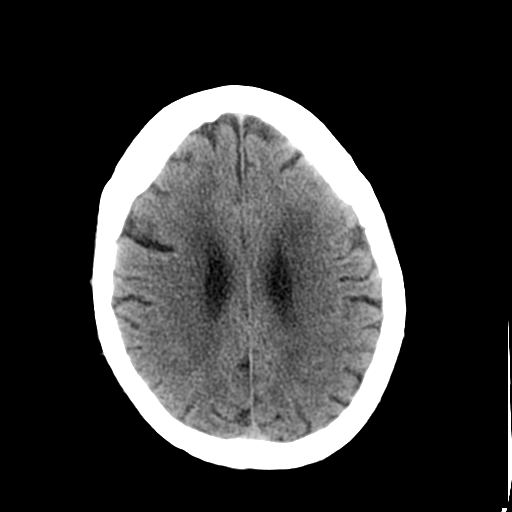
[im 20/36  bone]
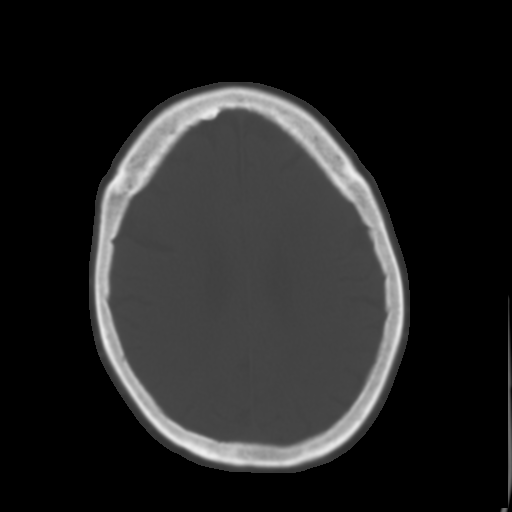
[im 22/36  brain]
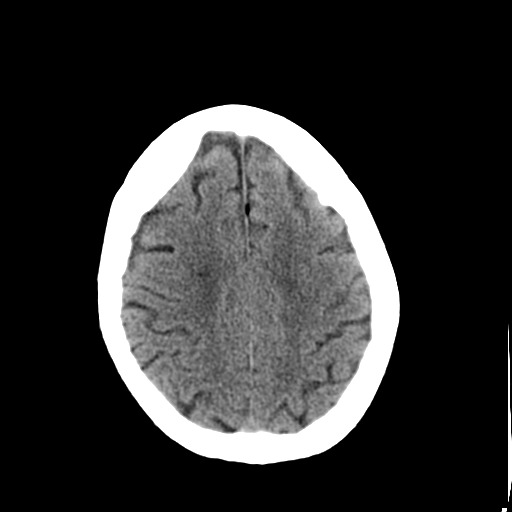
[im 25/36  brain]
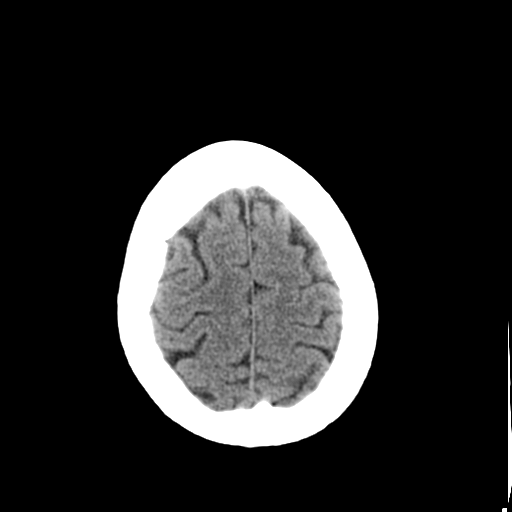
[im 27/36  brain]
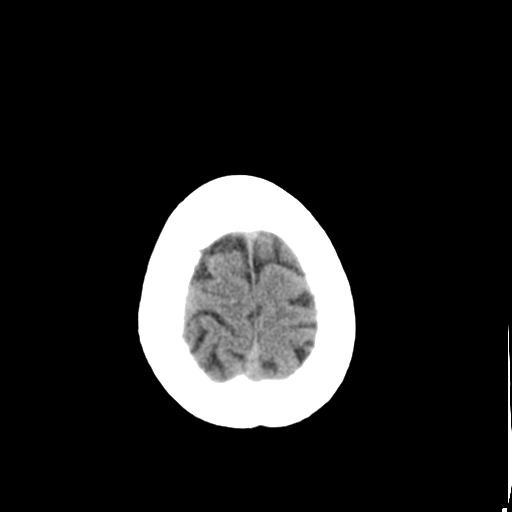
[im 29/36  brain]
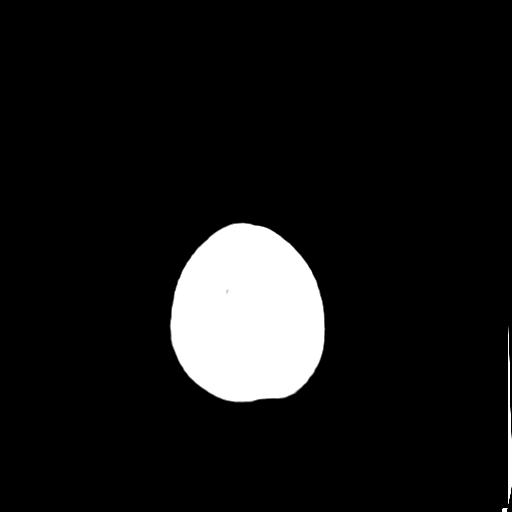
[im 29/36  bone]
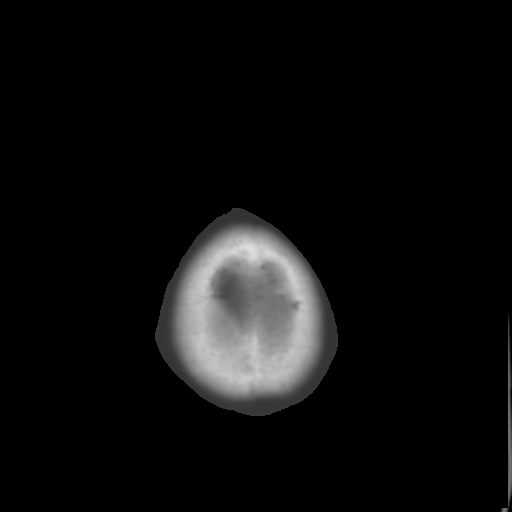
[im 32/36  brain]
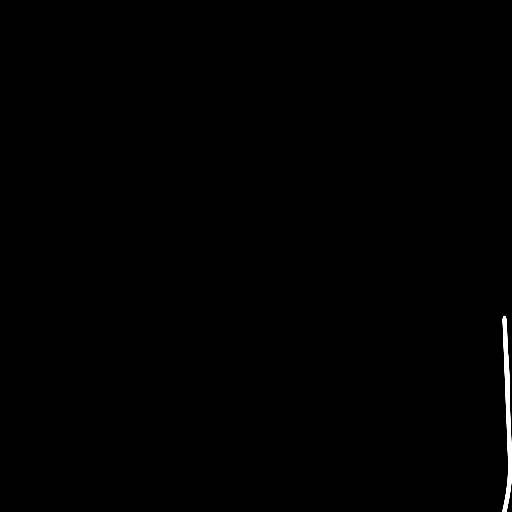
[im 34/36  brain]
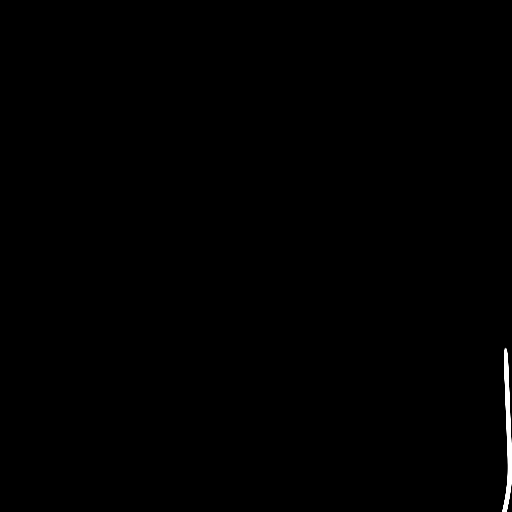

[15 of 30 positions shown; findings below may reference images not displayed]

FINDINGS: Mild diffuse atrophy is stable. There is no intracranial mass,
hemorrhage, extra-axial fluid collection, or midline shift. There is
patchy small vessel disease throughout the centra semiovale
bilaterally, stable. There is no new gray-white compartment lesion.
No acute infarct evident. The middle cerebral artery attenuation is
symmetric and unchanged bilaterally. Bony calvarium appears intact.
Mastoids on the right are clear. There is persistent opacification
of several mastoid air cells on the left. There is mucosal
thickening in several ethmoid air cells bilaterally. No intraorbital
lesions are evident.
IMPRESSION: Stable atrophy with patchy periventricular small vessel disease. No
acute infarct evident. No new gray-white compartment lesion. No
hemorrhage or mass effect. Stable left-sided mastoid disease as well
as bilateral ethmoid sinus disease present.

Critical Value/emergent results were called by telephone at the time
of interpretation on 03/18/2016 at [DATE] to Dr. CHIPAN PAU ,
who verbally acknowledged these results.

## 2018-03-23 IMAGING — MR MR MRA HEAD W/O CM
1 series · 15 of 48 positions shown · non-contrast
Comparison: Head CT earlier today. Head MRI 08/29/2015. Head MRA
06/22/2012.

CLINICAL DATA: Altered mental status and weakness for 1 day.

EXAM:
MRI HEAD WITHOUT CONTRAST
MRA HEAD WITHOUT CONTRAST
TECHNIQUE: Multiplanar, multiecho pulse sequences of the brain and surrounding
structures were obtained without intravenous contrast. Angiographic
images of the head were obtained using MRA technique without
contrast.

[Series 1: MRA · axial · 0.8mm · 0.38mm/px · z∈[-69,+30]mm · 15 of 131 slices shown]
[im 1/131]
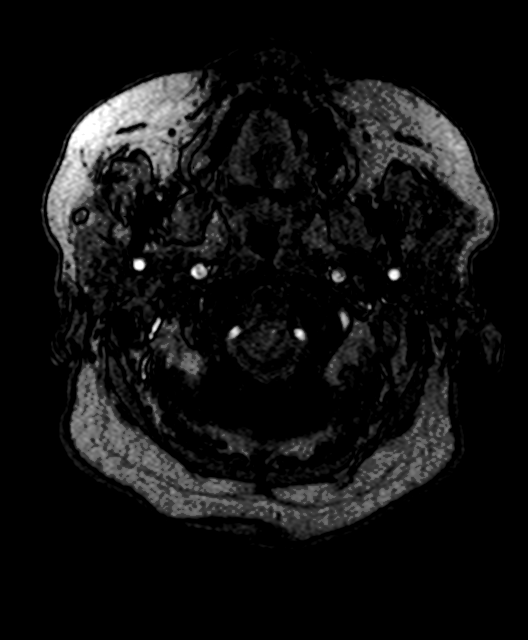
[im 3/131]
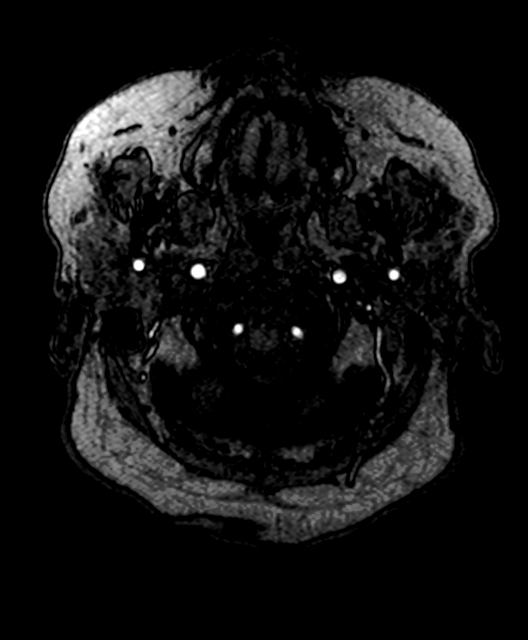
[im 6/131]
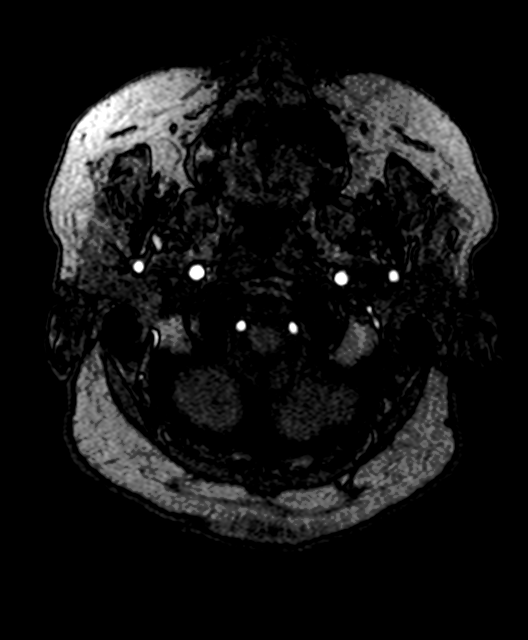
[im 9/131]
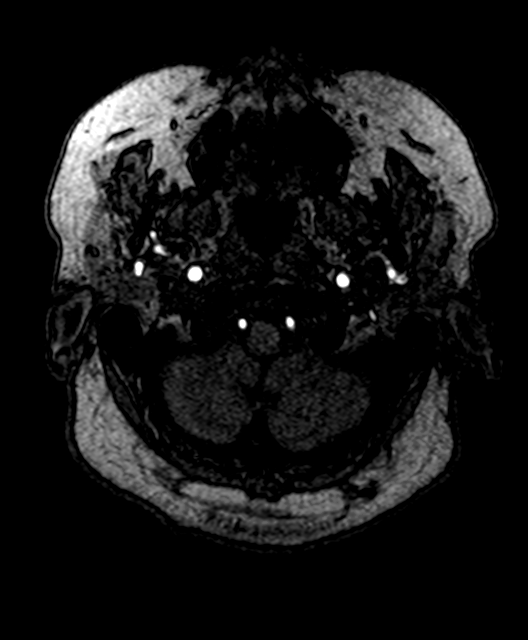
[im 12/131]
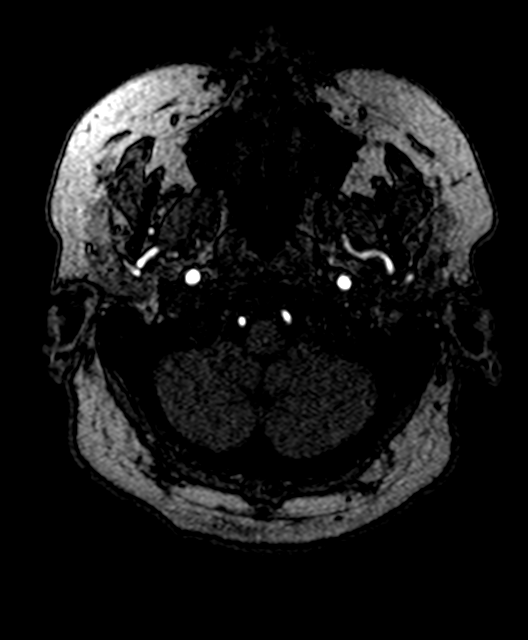
[im 23/131]
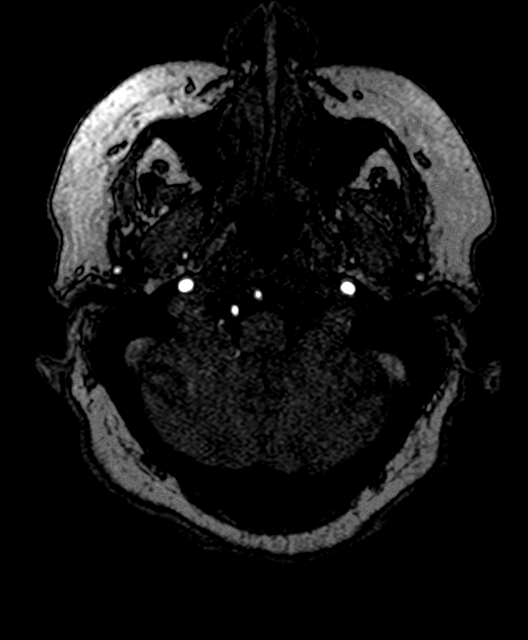
[im 25/131]
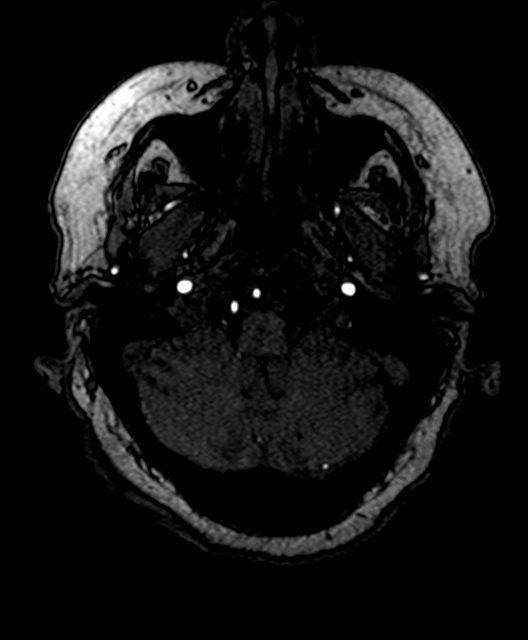
[im 42/131]
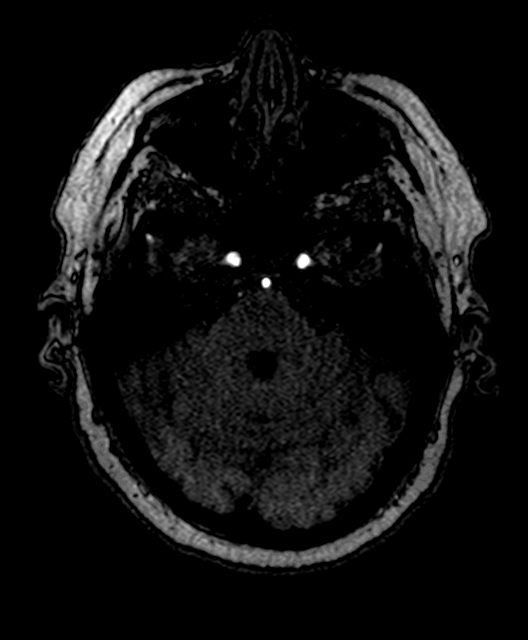
[im 59/131]
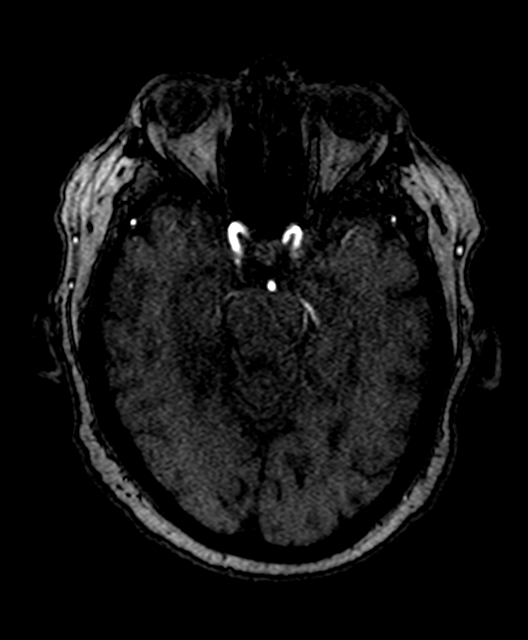
[im 67/131]
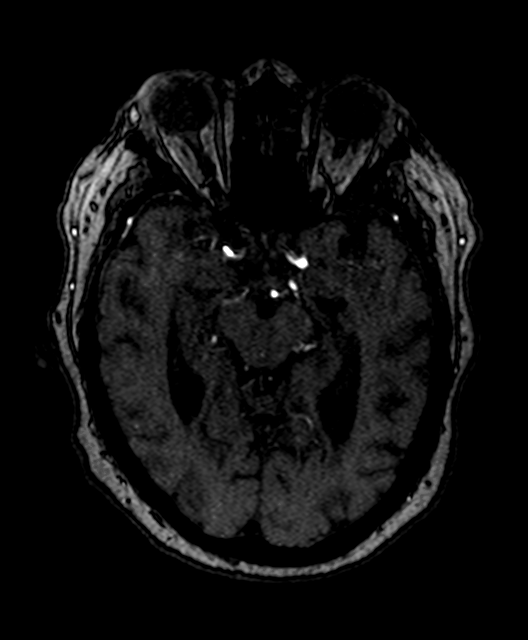
[im 75/131]
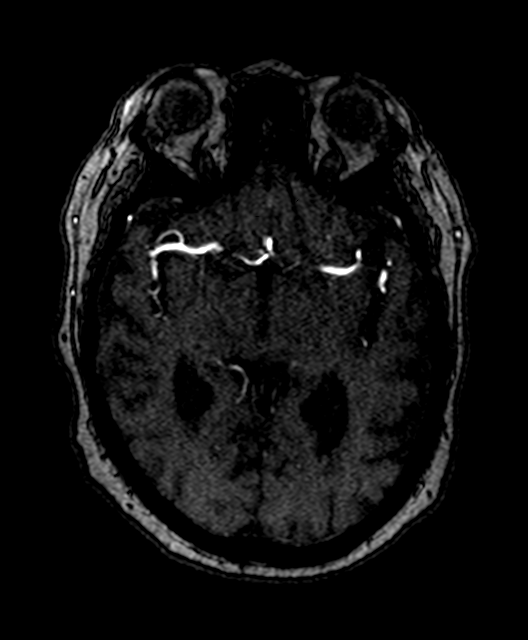
[im 92/131]
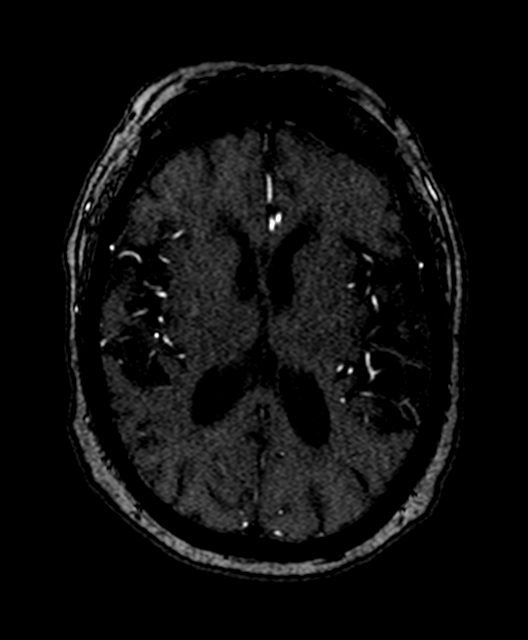
[im 108/131]
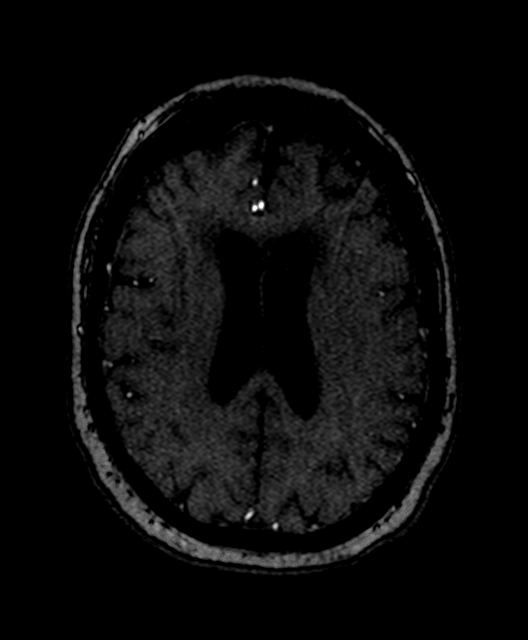
[im 111/131]
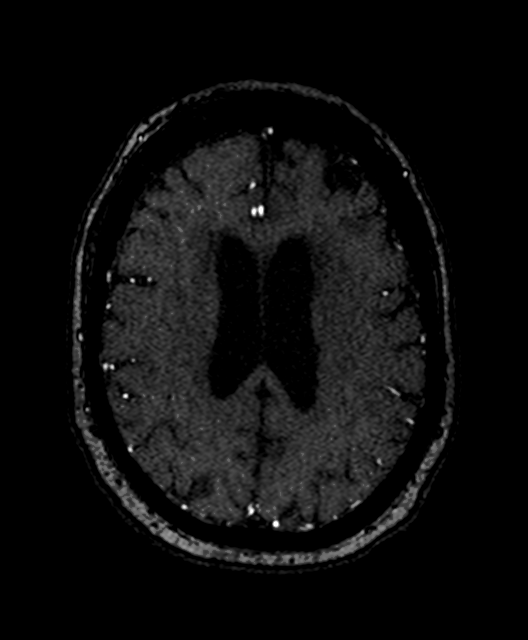
[im 125/131]
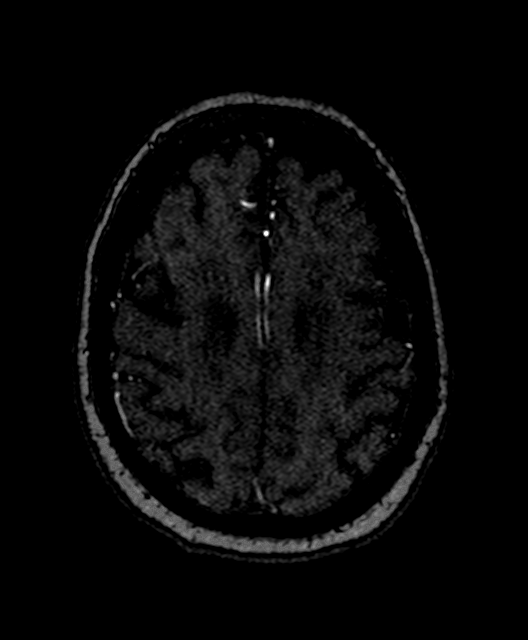

[15 of 48 positions shown; findings below may reference images not displayed]

FINDINGS: MRI HEAD FINDINGS

Examination is mildly motion degraded.

There is no evidence of acute infarct, intracranial hemorrhage,
mass, midline shift, or extra-axial fluid collection. There is mild
generalized cerebral atrophy. Patchy T2 hyperintensities involving
the periventricular greater than subcortical cerebral white matter
are similar to the prior MRI and nonspecific but compatible with
moderate chronic small vessel ischemic disease. A chronic right
thalamic lacunar infarct is unchanged.

Prior bilateral cataract extraction is noted. There is mild left
frontal and left ethmoid sinus mucosal thickening, and there are
trace right and small left mastoid effusions. Major intracranial
vascular flow voids are preserved.

MRA HEAD FINDINGS

The visualized distal vertebral arteries are patent and codominant
without significant stenosis. Basilar artery is patent without
stenosis. Right PICA origin is patent. There may be a severe right
AICA origin stenosis. Right SCA is grossly patent with possible
origin stenosis. Left SCA is not well evaluated. There is a fetal
type origin of the right PCA. The left PCA demonstrates moderate P3
narrowing without significant proximal stenosis. The right PCA
appears attenuated with areas of apparent moderate to severe
narrowing involving the P2 segment as well as more distal branch
vessels. Right PCA attenuation and P2 narrowing appears increased
compared to the prior MRA.

The internal carotid arteries are patent from skullbase to carotid
termini without significant stenosis identified on the right. There
may be mild narrowing of the left ICA at the level of the anterior
genu. M1 segments are patent without evidence of significant
stenosis. There is likely a moderate to severe stenosis involving
the proximal M2 inferior division on the right. Mild MCA branch
vessel irregularity is seen more distally bilaterally.

The right A1 segment is again noted to be hypoplastic and
irregularly narrowed. Left A1 segment is widely patent. Anterior
communicating artery is patent. A2 segments are patent without
evidence of significant stenosis. No definite intracranial aneurysm
is identified, with minimal prominence of the anterior communicating
artery similar to the prior study.
IMPRESSION: 1. No acute intracranial abnormality.
2. Moderate chronic small vessel ischemic disease.
3. Severe right P2 PCA narrowing, increased from prior.
4. Mild left ICA stenosis. Moderate to severe right M2 MCA stenosis.

## 2018-03-23 IMAGING — MR MR HEAD W/O CM
6 of 10 series · 25 of 48 positions shown · non-contrast
Comparison: Head CT earlier today. Head MRI 08/29/2015. Head MRA
06/22/2012.

CLINICAL DATA: Altered mental status and weakness for 1 day.

EXAM:
MRI HEAD WITHOUT CONTRAST
MRA HEAD WITHOUT CONTRAST
TECHNIQUE: Multiplanar, multiecho pulse sequences of the brain and surrounding
structures were obtained without intravenous contrast. Angiographic
images of the head were obtained using MRA technique without
contrast.

[Series 2: t1_fl2d_sag · sagittal · 5.0mm · 0.43mm/px · 3 of 20 slices shown]
[im 1/20]
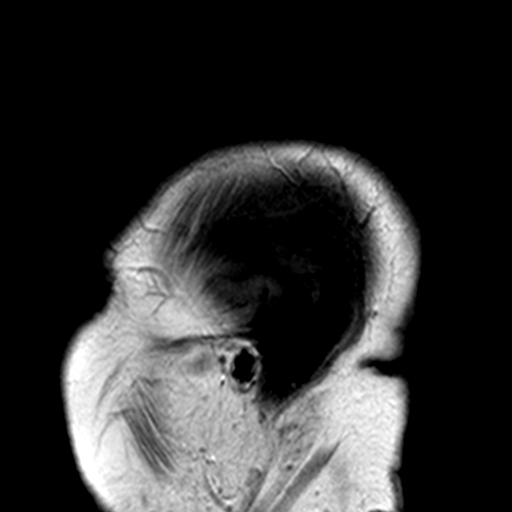
[im 10/20]
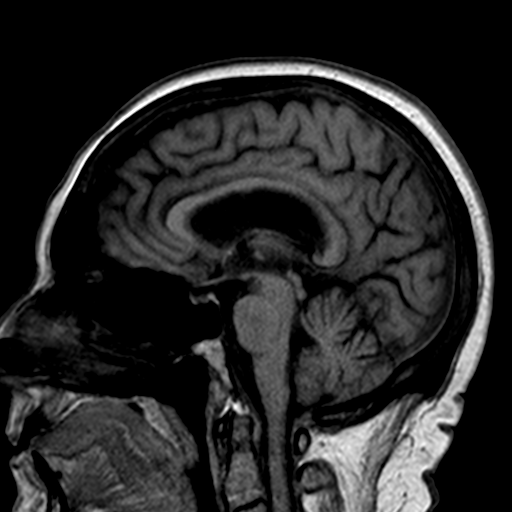
[im 20/20]
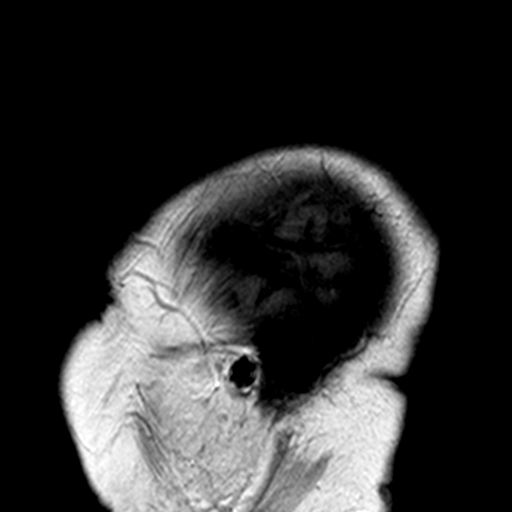

[Series 5: T2 · axial · 5.0mm · 0.75mm/px · z∈[-63,+79]mm · 3 of 23 slices shown (1 of 2)]
[im 1/23]
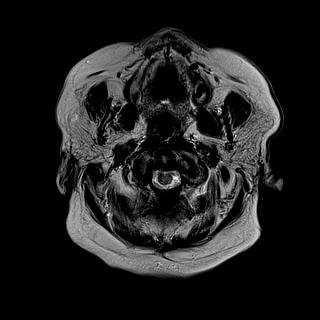
[im 12/23]
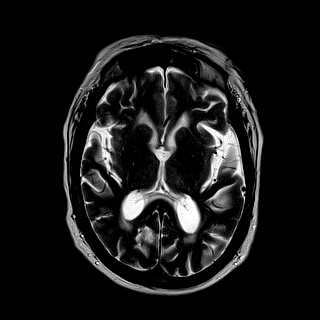
[im 23/23]
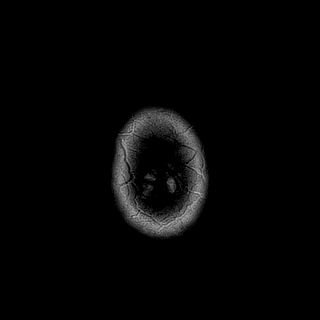

[Series 6: FLAIR · axial · 5.0mm · 0.94mm/px · z∈[-63,+79]mm · 3 of 23 slices shown]
[im 1/23]
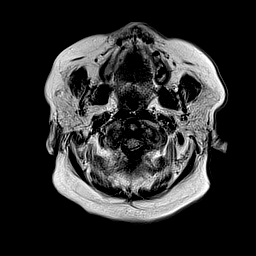
[im 12/23]
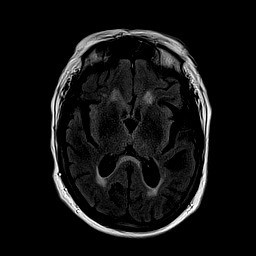
[im 23/23]
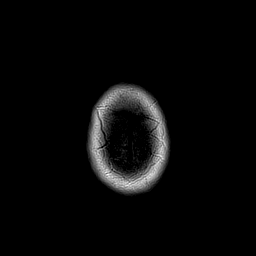

[Series 7: T1 · axial · 2.0mm · 0.47mm/px · z∈[-78,+109]mm · 11 of 95 slices shown]
[im 1/95]
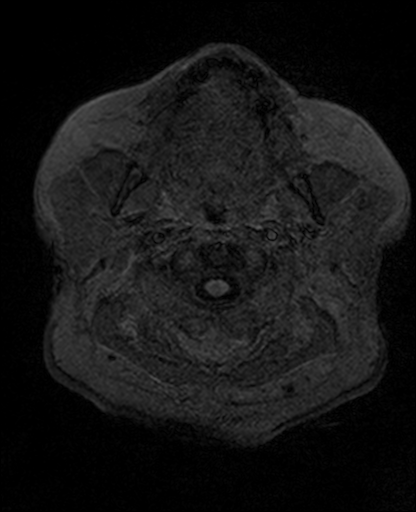
[im 10/95]
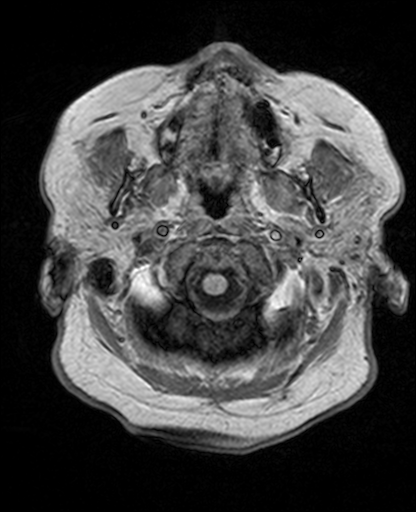
[im 19/95]
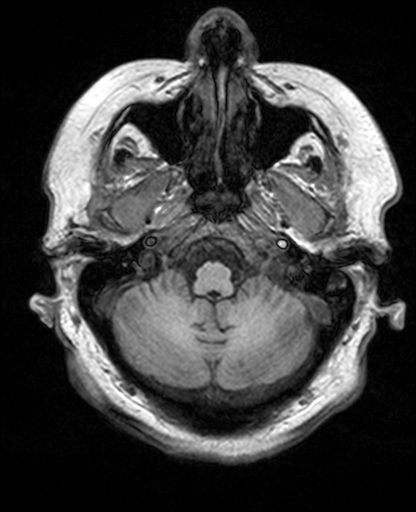
[im 29/95]
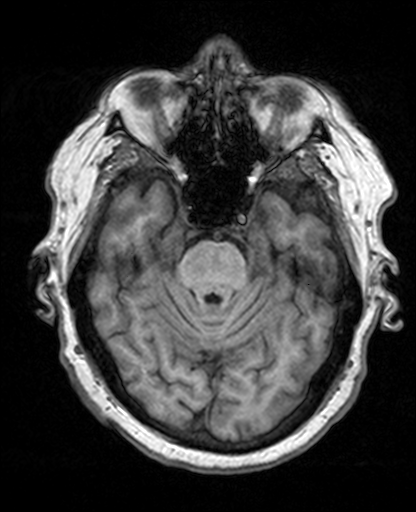
[im 38/95]
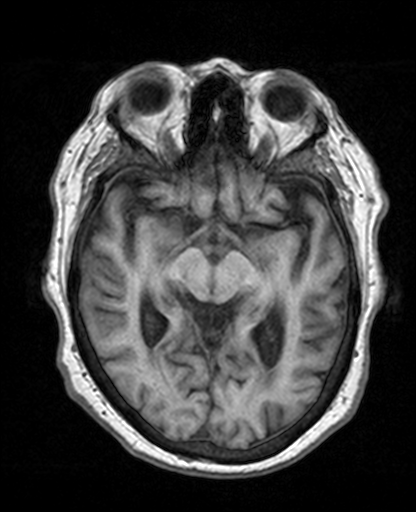
[im 48/95]
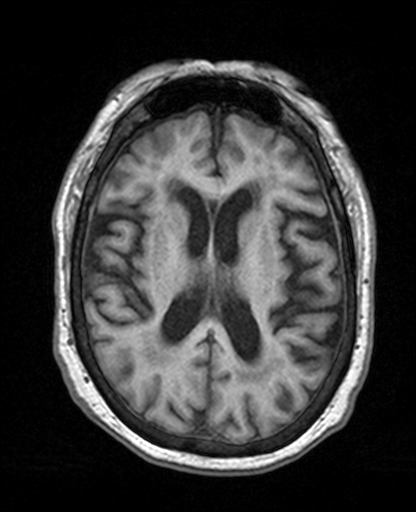
[im 57/95]
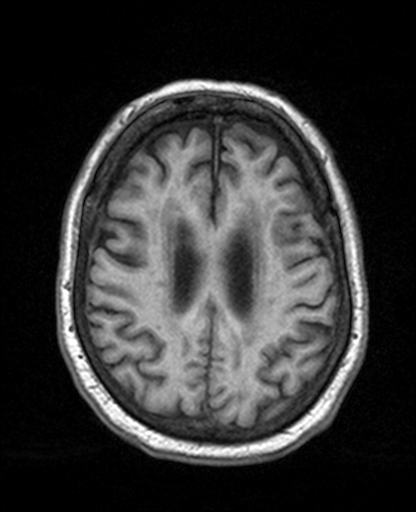
[im 66/95]
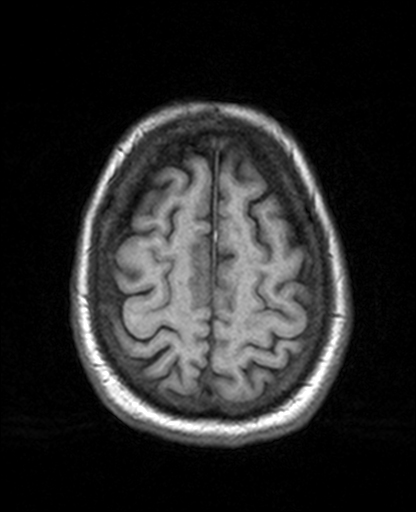
[im 76/95]
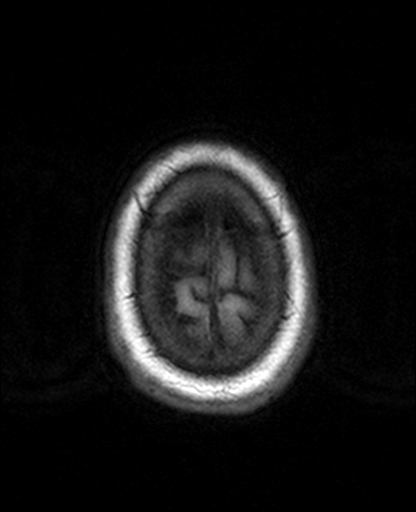
[im 85/95]
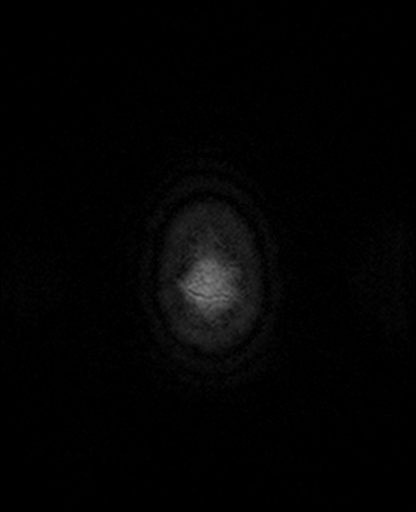
[im 95/95]
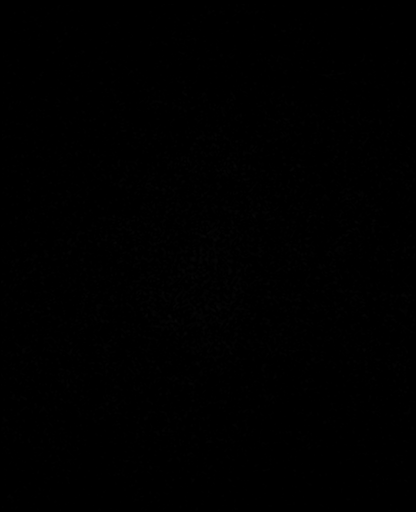

[Series 8: trauma axial · axial · 5.0mm · 0.45mm/px · z∈[-58,+7]mm · 2 of 21 slices shown]
[im 1/21]
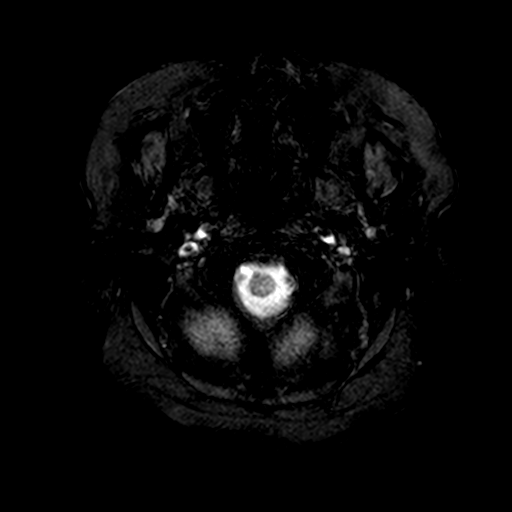
[im 11/21]
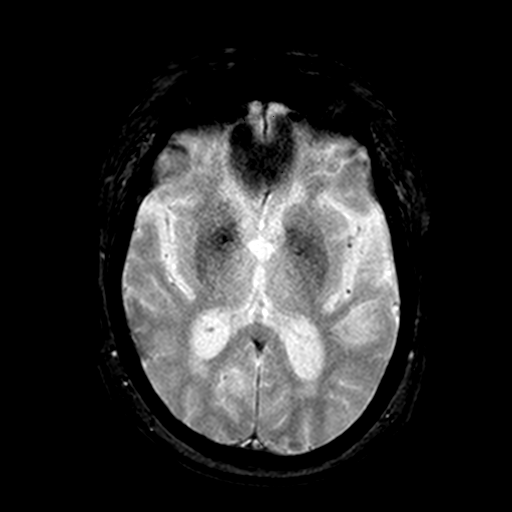

[Series 9: T2 · coronal · 5.0mm · 0.62mm/px · 3 of 28 slices shown (2 of 2)]
[im 1/28]
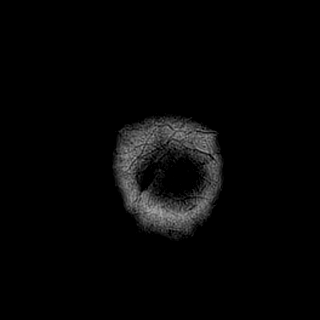
[im 14/28]
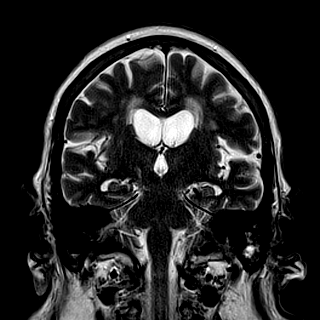
[im 28/28]
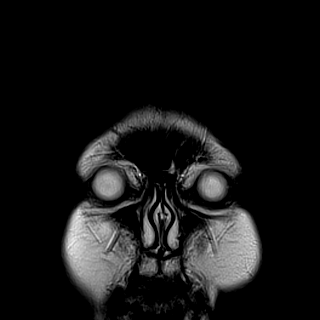

[25 of 48 positions shown; findings below may reference images not displayed]

FINDINGS: MRI HEAD FINDINGS

Examination is mildly motion degraded.

There is no evidence of acute infarct, intracranial hemorrhage,
mass, midline shift, or extra-axial fluid collection. There is mild
generalized cerebral atrophy. Patchy T2 hyperintensities involving
the periventricular greater than subcortical cerebral white matter
are similar to the prior MRI and nonspecific but compatible with
moderate chronic small vessel ischemic disease. A chronic right
thalamic lacunar infarct is unchanged.

Prior bilateral cataract extraction is noted. There is mild left
frontal and left ethmoid sinus mucosal thickening, and there are
trace right and small left mastoid effusions. Major intracranial
vascular flow voids are preserved.

MRA HEAD FINDINGS

The visualized distal vertebral arteries are patent and codominant
without significant stenosis. Basilar artery is patent without
stenosis. Right PICA origin is patent. There may be a severe right
AICA origin stenosis. Right SCA is grossly patent with possible
origin stenosis. Left SCA is not well evaluated. There is a fetal
type origin of the right PCA. The left PCA demonstrates moderate P3
narrowing without significant proximal stenosis. The right PCA
appears attenuated with areas of apparent moderate to severe
narrowing involving the P2 segment as well as more distal branch
vessels. Right PCA attenuation and P2 narrowing appears increased
compared to the prior MRA.

The internal carotid arteries are patent from skullbase to carotid
termini without significant stenosis identified on the right. There
may be mild narrowing of the left ICA at the level of the anterior
genu. M1 segments are patent without evidence of significant
stenosis. There is likely a moderate to severe stenosis involving
the proximal M2 inferior division on the right. Mild MCA branch
vessel irregularity is seen more distally bilaterally.

The right A1 segment is again noted to be hypoplastic and
irregularly narrowed. Left A1 segment is widely patent. Anterior
communicating artery is patent. A2 segments are patent without
evidence of significant stenosis. No definite intracranial aneurysm
is identified, with minimal prominence of the anterior communicating
artery similar to the prior study.
IMPRESSION: 1. No acute intracranial abnormality.
2. Moderate chronic small vessel ischemic disease.
3. Severe right P2 PCA narrowing, increased from prior.
4. Mild left ICA stenosis. Moderate to severe right M2 MCA stenosis.

## 2018-04-09 IMAGING — DX DG HIP (WITH OR WITHOUT PELVIS) 3-4V BILAT
5 series · 5 of 5 positions shown · non-contrast
Comparison: None.

CLINICAL DATA: Persistent pain after several recent falls

EXAM:
DG HIP (WITH OR WITHOUT PELVIS) 3-4V BILAT

[pelvis ap]
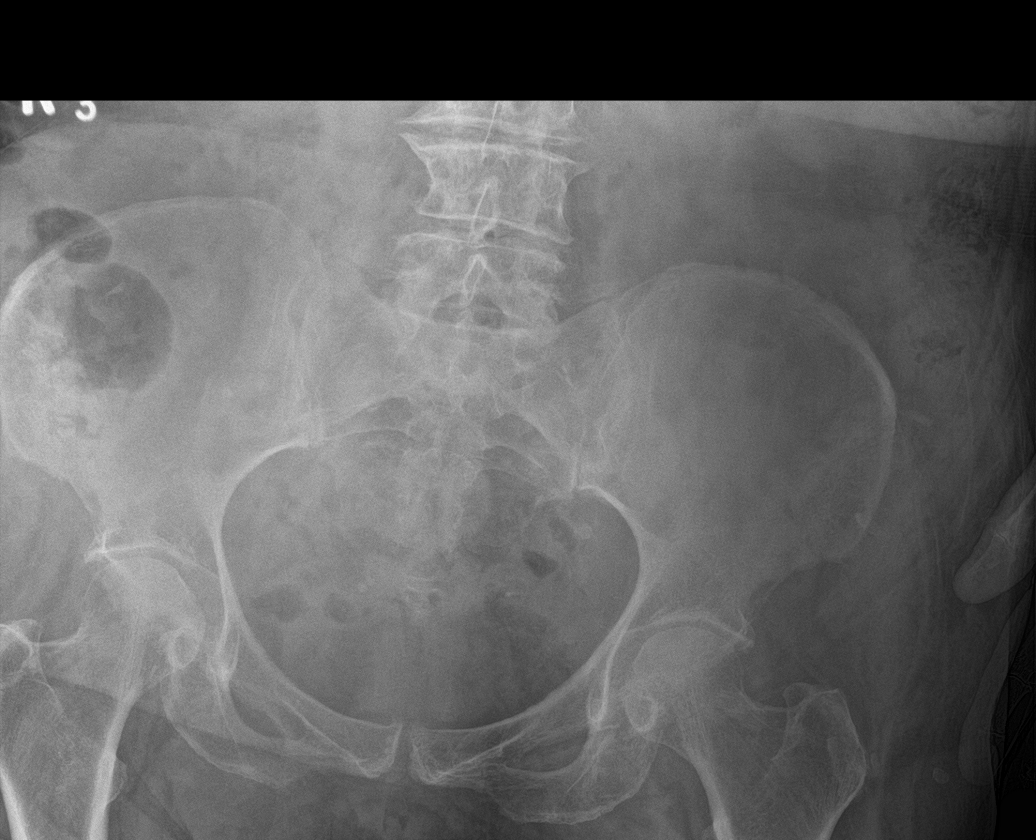

[hip ap (1 of 2)]
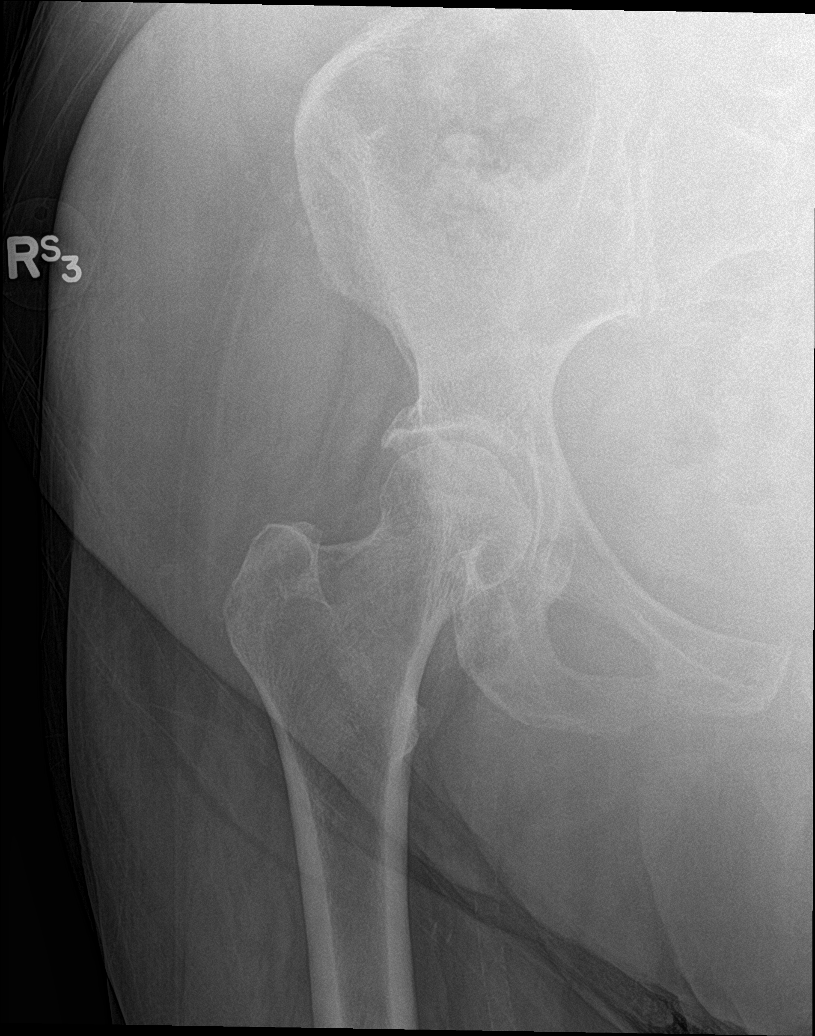

[hip lat (1 of 2)]
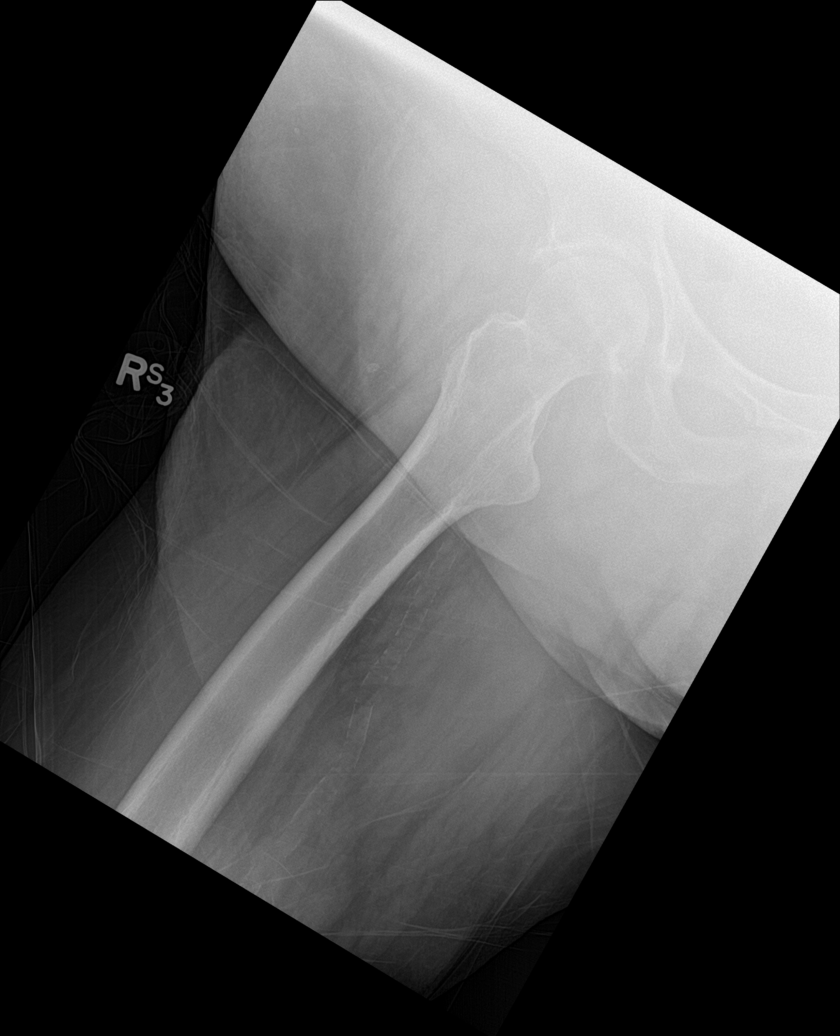

[hip ap (2 of 2)]
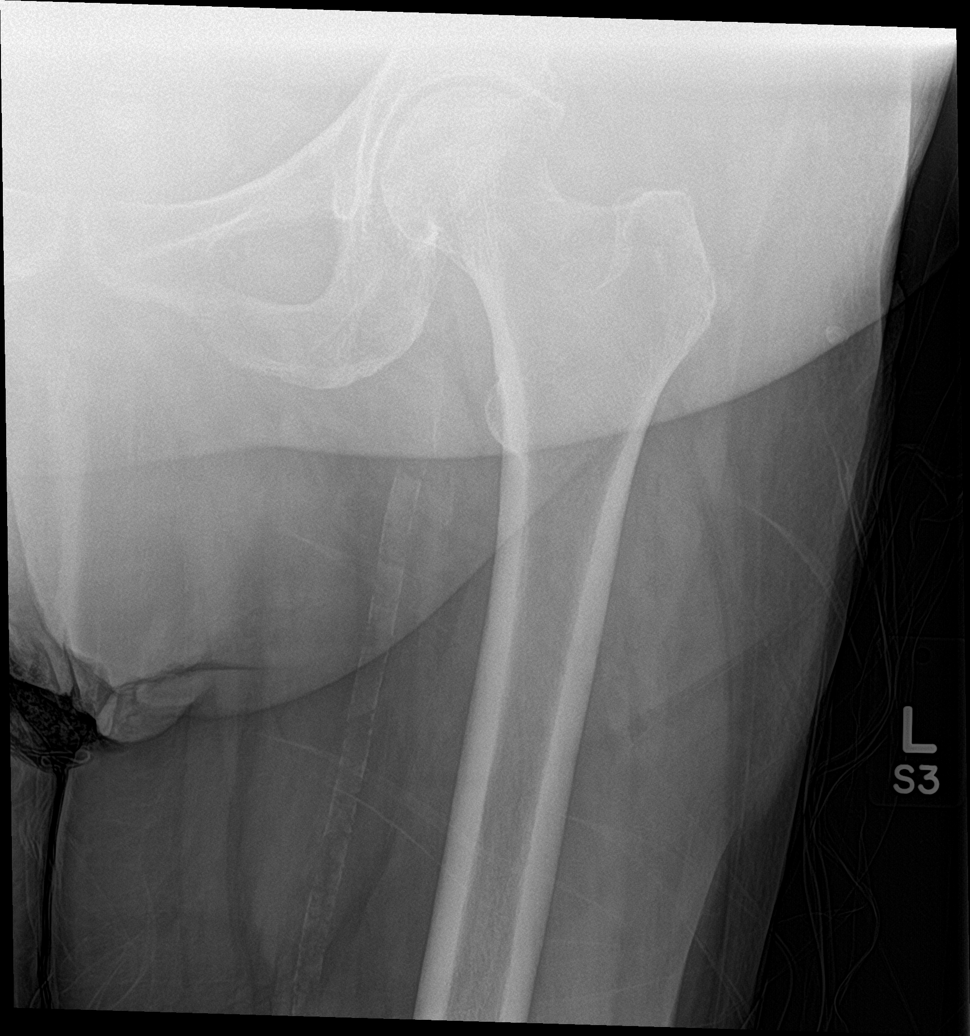

[hip lat (2 of 2)]
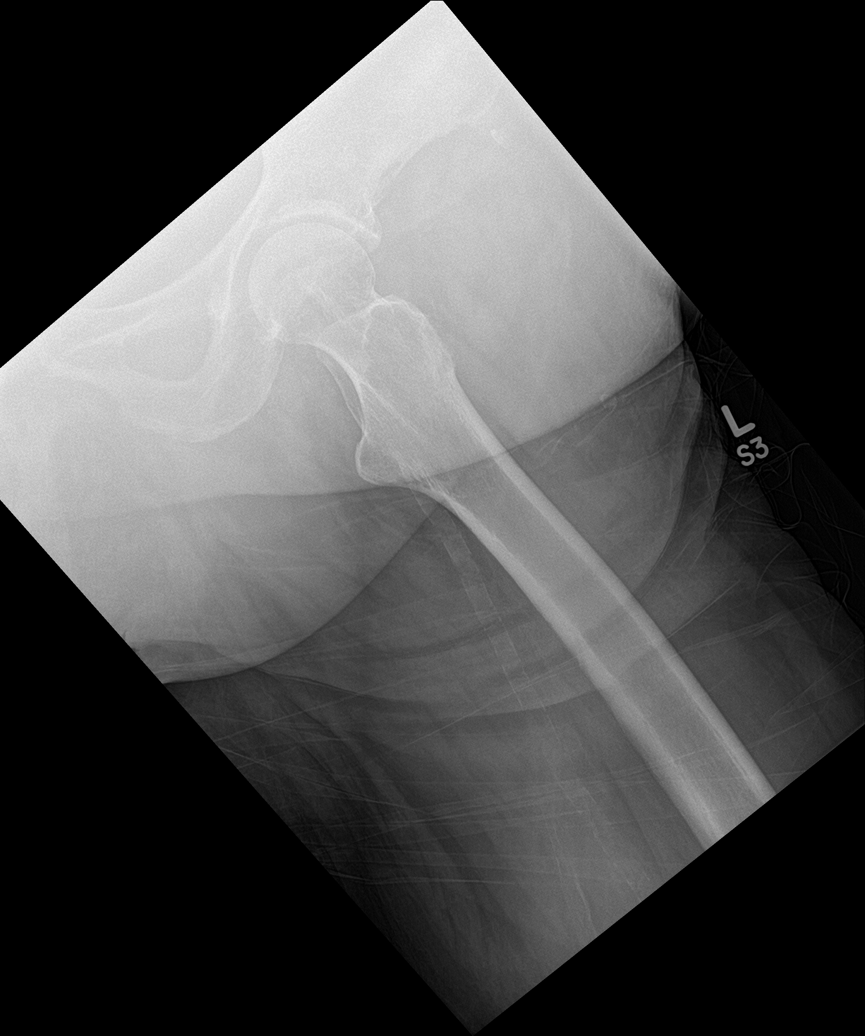

[5 of 5 positions shown; findings below may reference images not displayed]

FINDINGS: Frontal pelvis as well as frontal and lateral views of each
hip-total five views- obtained. No acute fracture or dislocation
evident. There is mild symmetric narrowing of both hip joints. No
erosive change. There is extensive arterial vascular calcification
bilaterally.
IMPRESSION: Symmetric osteoarthritic change in both hip joints. Multiple foci of
arterial vascular calcification. No fracture or dislocation.

## 2018-04-09 IMAGING — MR MR HEAD W/O CM
6 of 10 series · 21 of 48 positions shown · non-contrast
Comparison: Head CT without contrast 0836 hours today. Brain MRI
03/18/2016, and earlier.

CLINICAL DATA: 79-year-old female with multiple syncopal episodes
in the last several weeks. Increasing weakness, dizziness. Initial
encounter.

EXAM:
MRI HEAD WITHOUT CONTRAST
TECHNIQUE: Multiplanar, multiecho pulse sequences of the brain and surrounding
structures were obtained without intravenous contrast.

[Series 5: T1 · sagittal · 5.0mm · 0.43mm/px · 3 of 21 slices shown (1 of 2)]
[im 1/21]
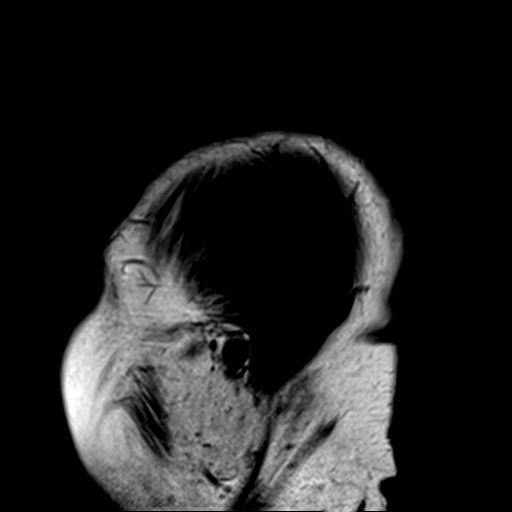
[im 11/21]
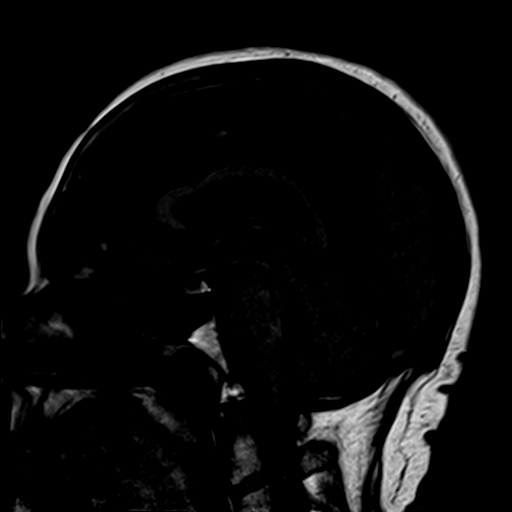
[im 21/21]
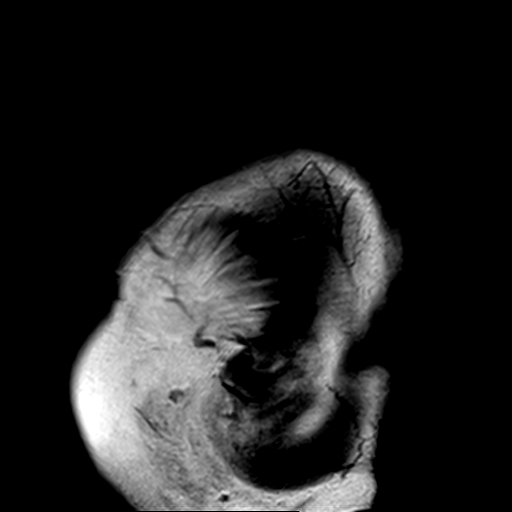

[Series 6: T2 · axial · 5.0mm · 0.48mm/px · z∈[-48,+95]mm · 3 of 23 slices shown (1 of 2)]
[im 1/23]
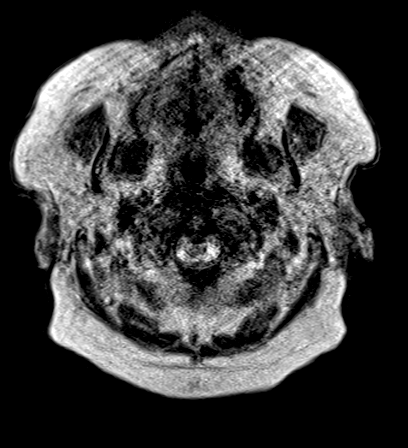
[im 12/23]
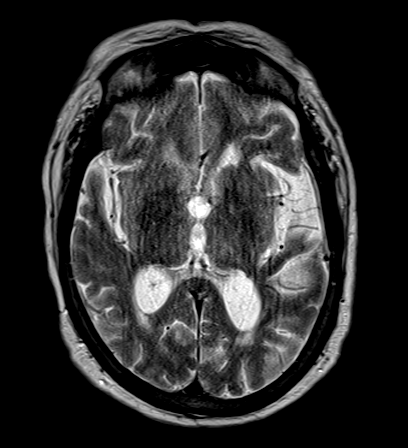
[im 23/23]
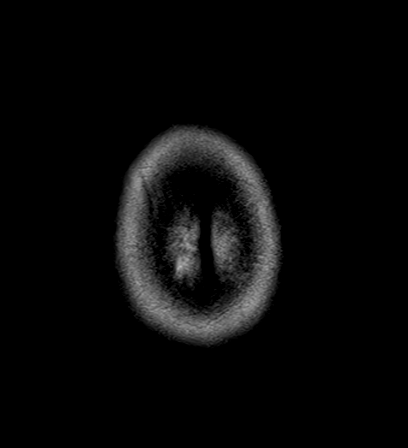

[Series 7: FLAIR · axial · 5.0mm · 0.35mm/px · z∈[-48,+95]mm · 3 of 23 slices shown]
[im 1/23]
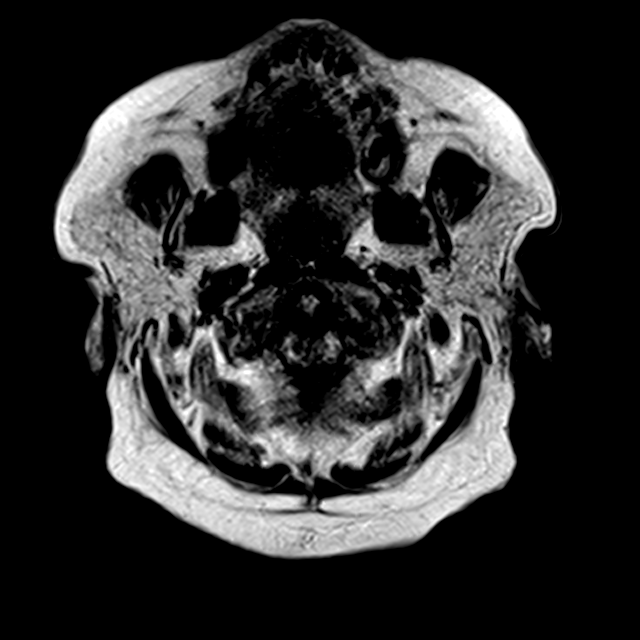
[im 12/23]
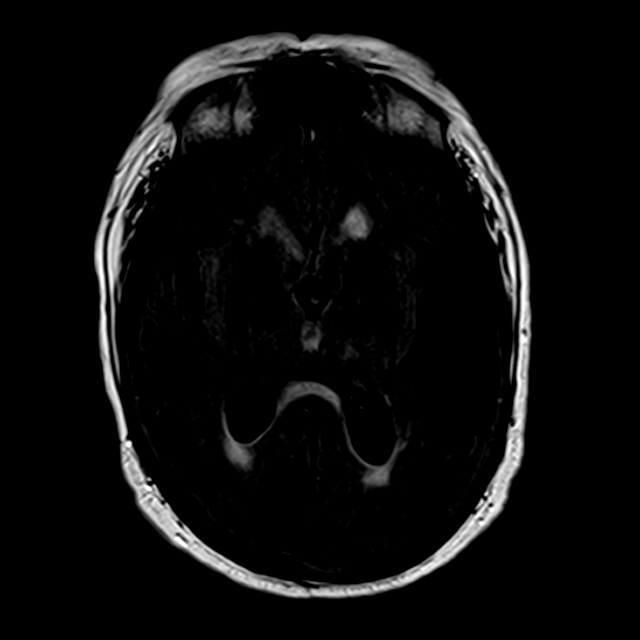
[im 23/23]
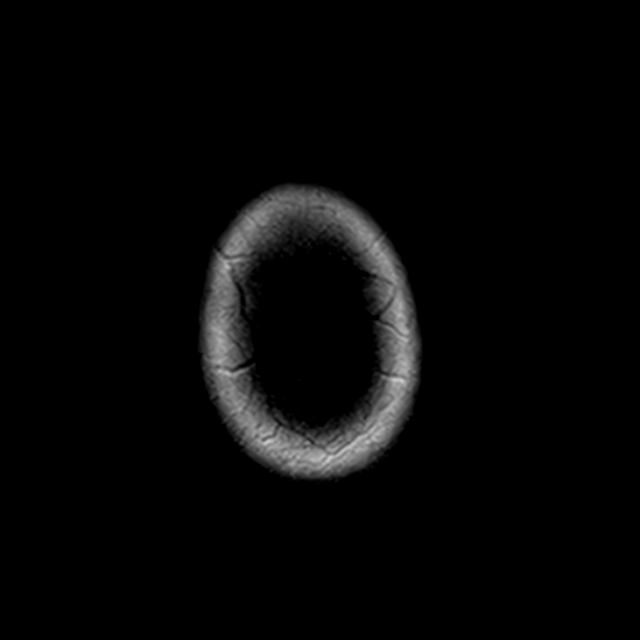

[Series 8: T1 · axial · 2.0mm · 0.43mm/px · z∈[-51,+99]mm · 8 of 76 slices shown (2 of 2)]
[im 1/76]
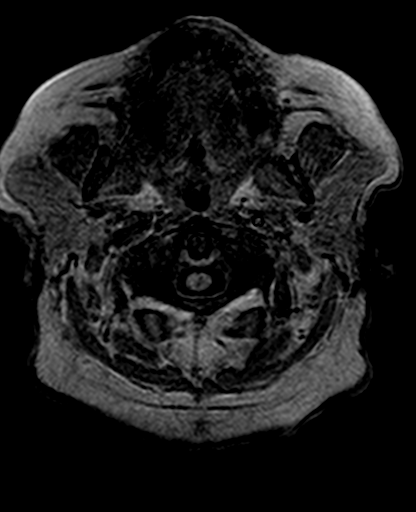
[im 10/76]
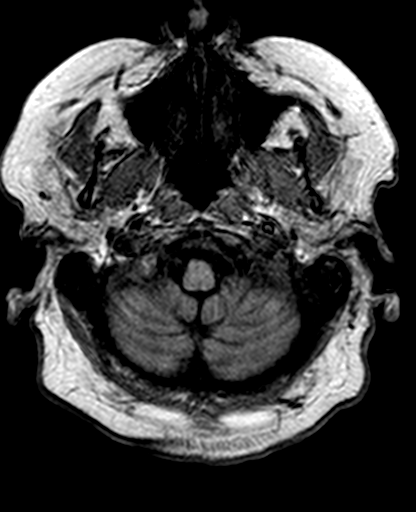
[im 19/76]
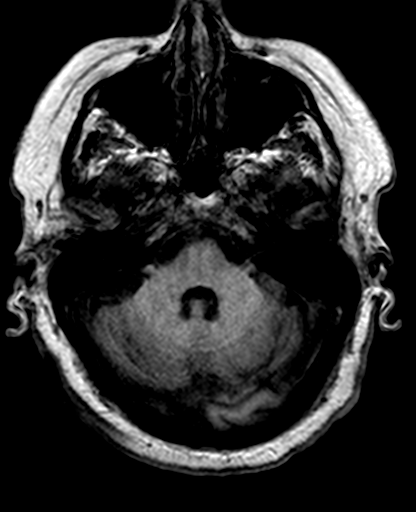
[im 29/76]
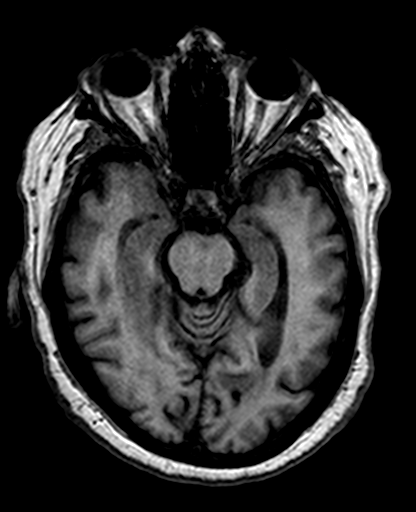
[im 47/76]
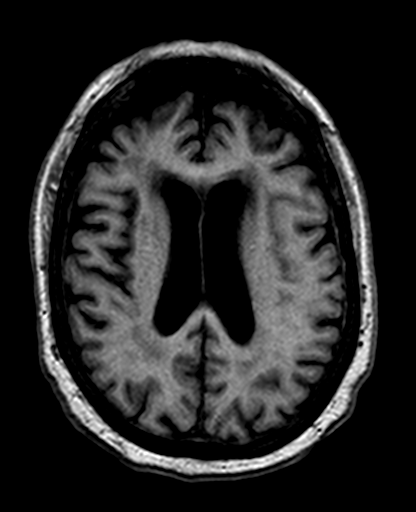
[im 57/76]
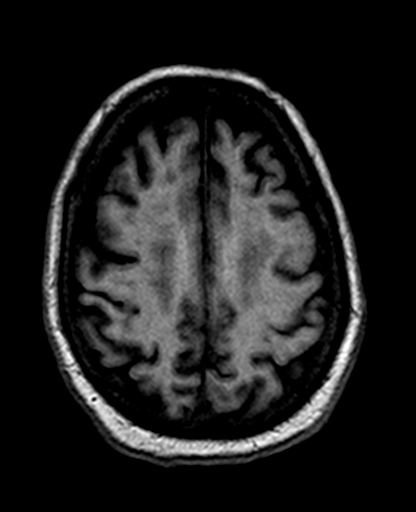
[im 66/76]
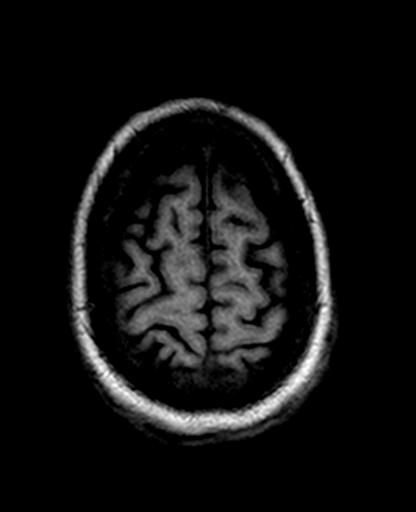
[im 76/76]
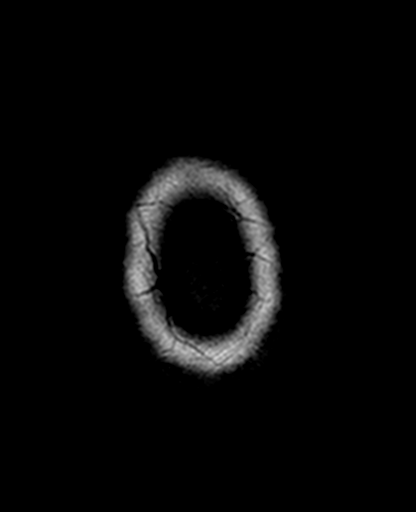

[Series 9: trauma axial · axial · 5.0mm · 0.44mm/px · 1 of 23 slices shown]
[im 1/23]
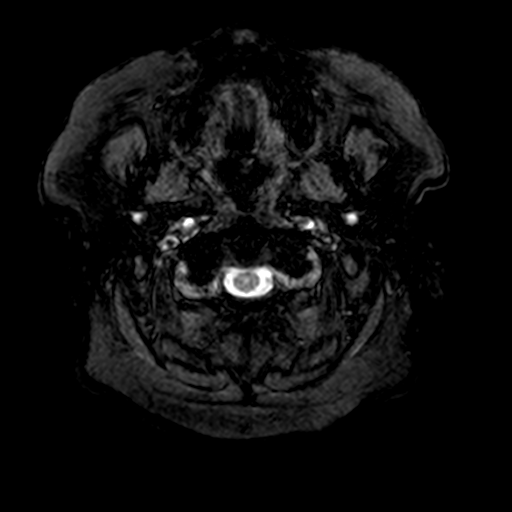

[Series 10: T2 · coronal · 5.0mm · 0.50mm/px · 3 of 28 slices shown (2 of 2)]
[im 1/28]
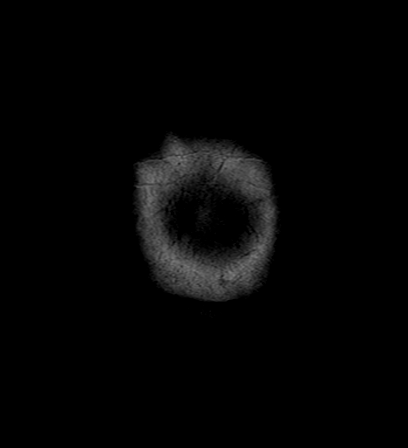
[im 14/28]
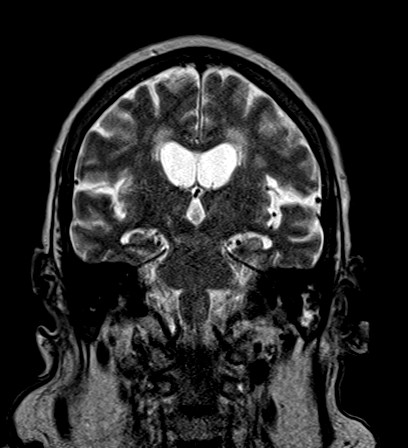
[im 28/28]
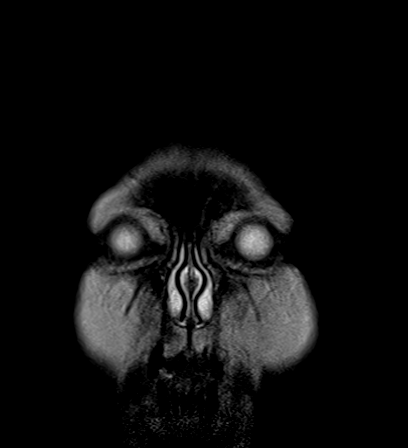

[21 of 48 positions shown; findings below may reference images not displayed]

FINDINGS: Study is intermittently degraded by motion artifact despite repeated
imaging attempts.

Major intracranial vascular flow voids appear stable. No restricted
diffusion to suggest acute infarction. No midline shift, mass
effect, evidence of mass lesion, ventriculomegaly, extra-axial
collection or acute intracranial hemorrhage. Cervicomedullary
junction and pituitary are within normal limits. Negative visualized
cervical spine. Stable gray-white matter differentiation throughout
the brain. Nonspecific T2 and FLAIR hyperintensity in the cerebral
white matter and pons appears stable. Suggestion of small chronic
lacunar infarcts in both thalami again noted. No new signal
abnormality identified.

Grossly stable and normal visualized internal auditory structures.
Trace mastoid effusions appear stable, and the nasopharynx appears
normal. Mild ethmoid sinus mucosal thickening is stable.
Postoperative changes to both globes again noted common negative
orbit and scalp soft tissues. Normal bone marrow signal.
IMPRESSION: No acute intracranial abnormality.

Stable noncontrast MRI appearance of the brain since [REDACTED], with
mild motion degradation today.

## 2018-04-09 IMAGING — CT CT HEAD W/O CM
1 series · 16 of 30 positions shown, 20 images · non-contrast
Comparison: 03/18/2016

CLINICAL DATA: Weakness, diaphoresis for 2 days worse today,
syncopal episode today, diabetes mellitus, hypertension, CHF,
coronary artery disease post MI and CABG. Prior stroke

EXAM:
CT HEAD WITHOUT CONTRAST
TECHNIQUE: Contiguous axial images were obtained from the base of the skull
through the vertex without intravenous contrast.

[Series 2: headtrauma 4.8 h37s · axial · 0.44mm/px · z∈[+1156,+1316]mm · 16 of 36 slices shown, 20 images]
[im 2/36  brain]
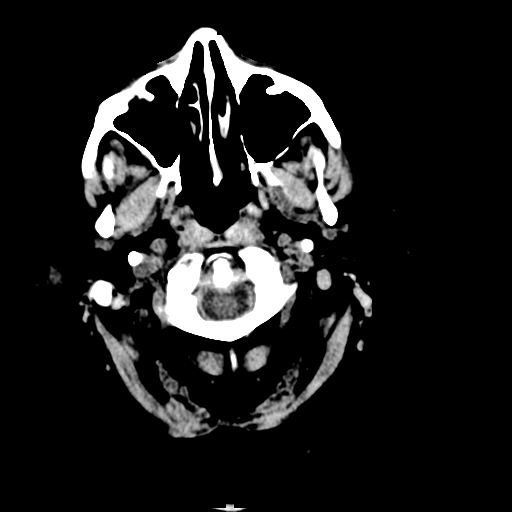
[im 2/36  bone]
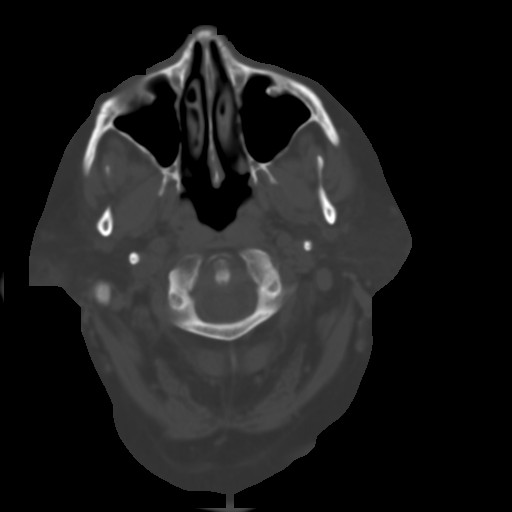
[im 4/36  brain]
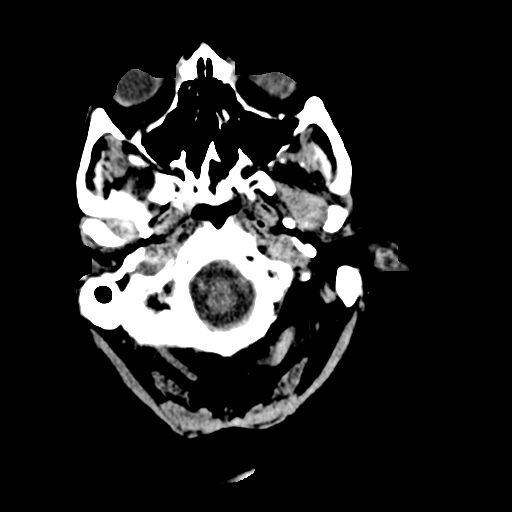
[im 7/36  brain]
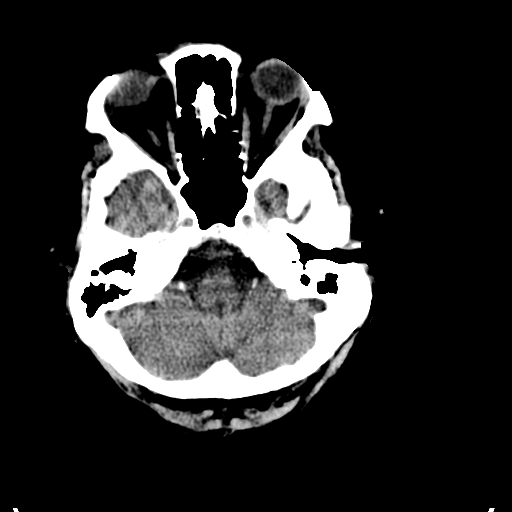
[im 9/36  brain]
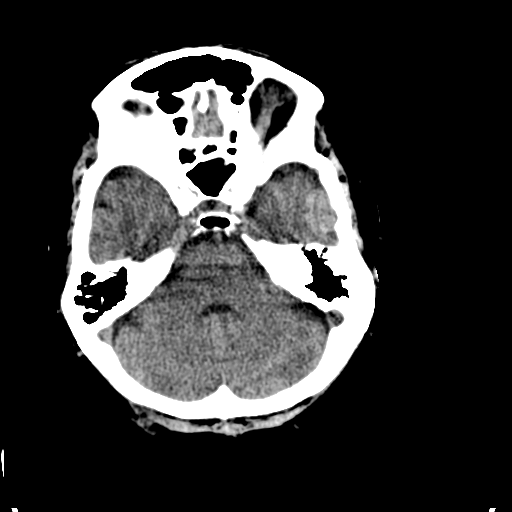
[im 10/36  brain]
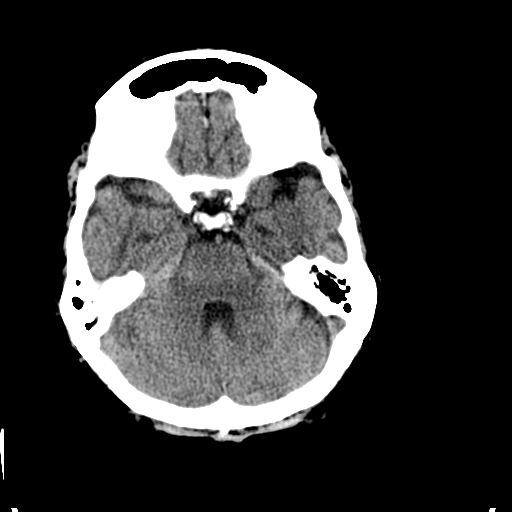
[im 10/36  bone]
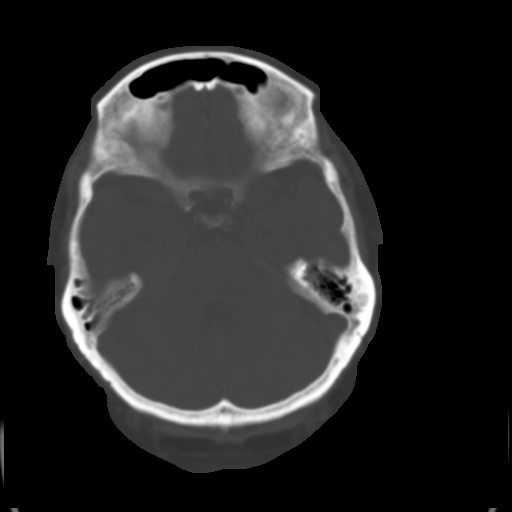
[im 13/36  brain]
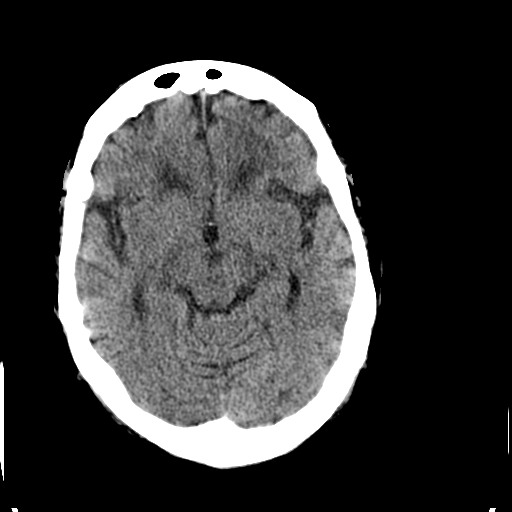
[im 15/36  brain]
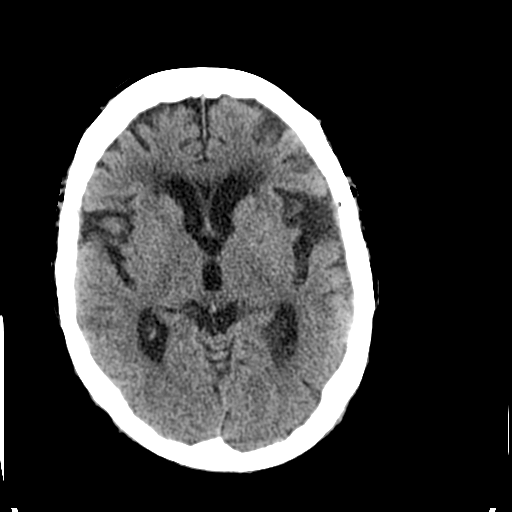
[im 17/36  brain]
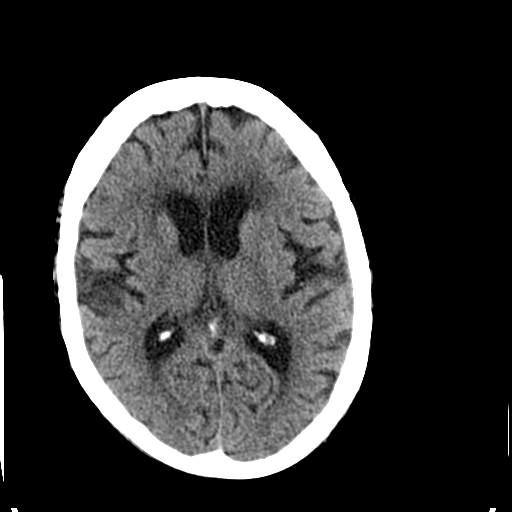
[im 19/36  brain]
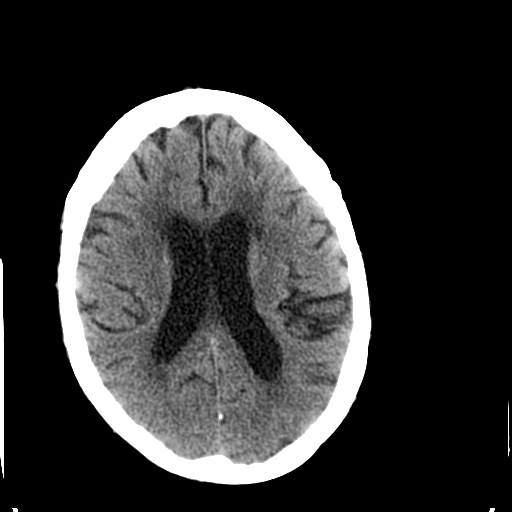
[im 19/36  bone]
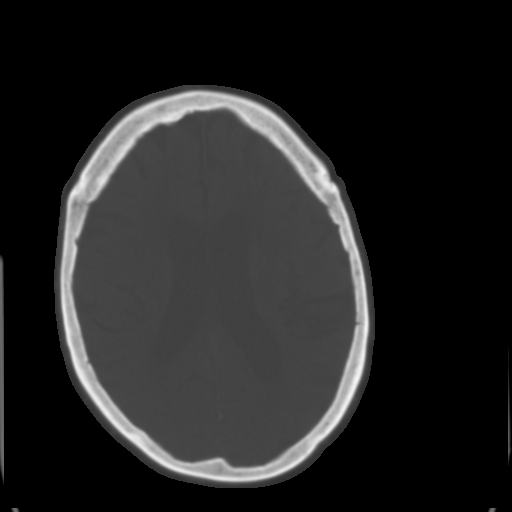
[im 21/36  brain]
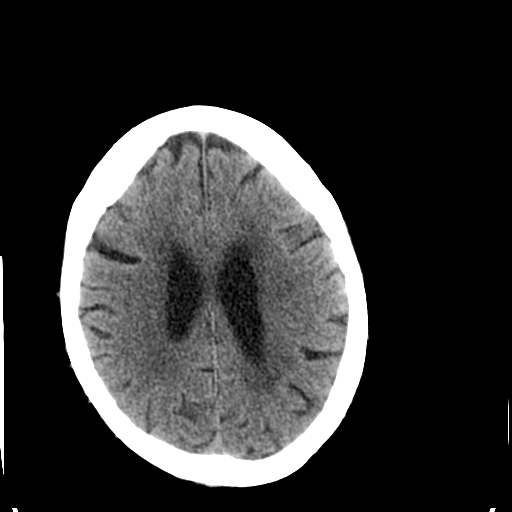
[im 23/36  brain]
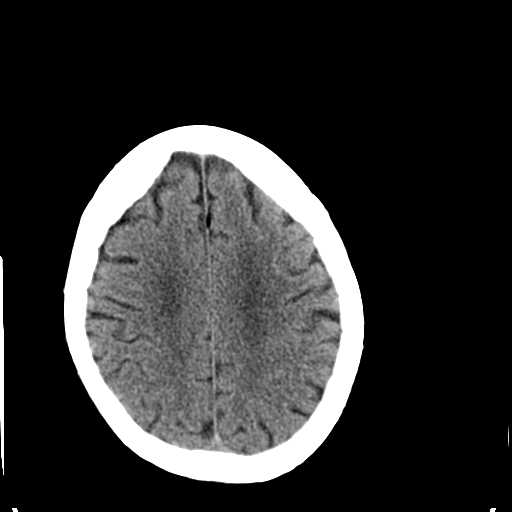
[im 26/36  brain]
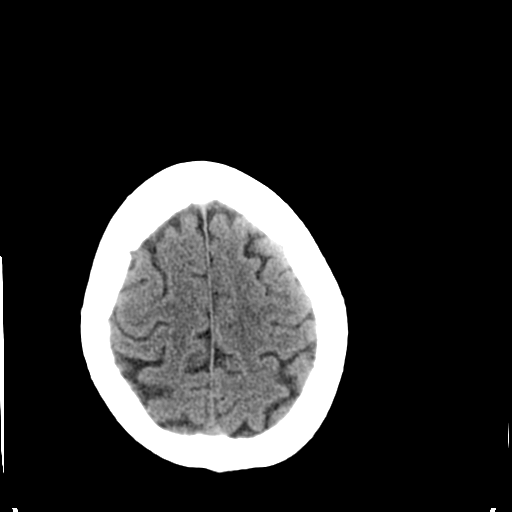
[im 27/36  brain]
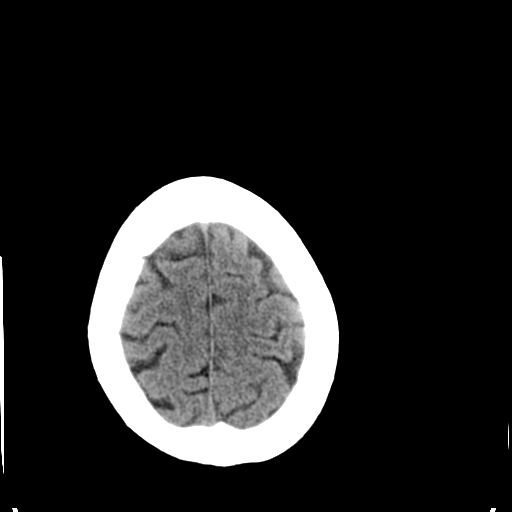
[im 27/36  bone]
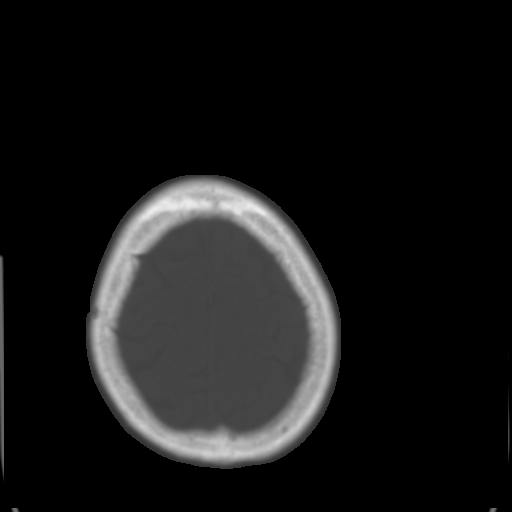
[im 29/36  brain]
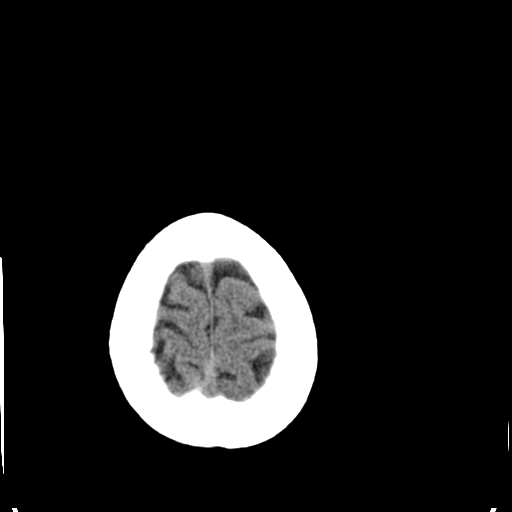
[im 32/36  brain]
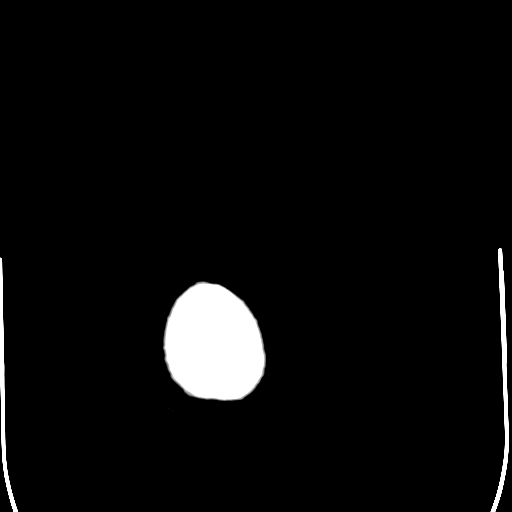
[im 34/36  brain]
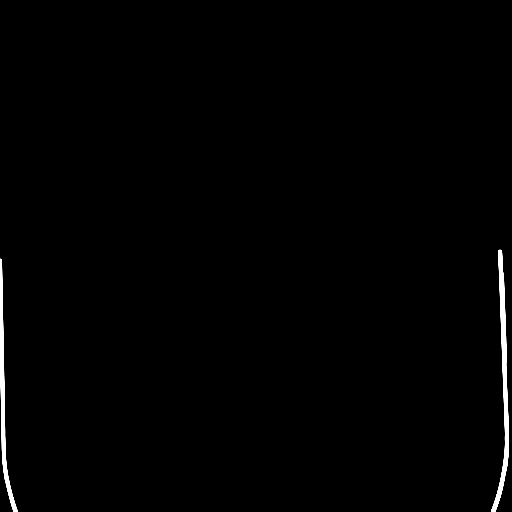

[16 of 30 positions shown; findings below may reference images not displayed]

FINDINGS: Scattered streak artifacts.

Generalized atrophy.

Normal ventricular morphology.

No midline shift or mass effect.

Small vessel chronic ischemic changes of deep cerebral white matter.

No intracranial hemorrhage, mass lesion, or acute infarction.

Visualized paranasal sinuses and mastoid air cells clear.

Bones unremarkable.
IMPRESSION: Atrophy with small vessel chronic ischemic changes of deep cerebral
white matter.

No acute intracranial abnormalities.

## 2018-04-25 IMAGING — DX DG CHEST 2V
2 series · 2 of 2 positions shown · non-contrast
Comparison: Chest radiograph 04/04/2016.

CLINICAL DATA: Patient with worsening cough and shortness of
breath.

EXAM:
CHEST  2 VIEW

[chest pa]
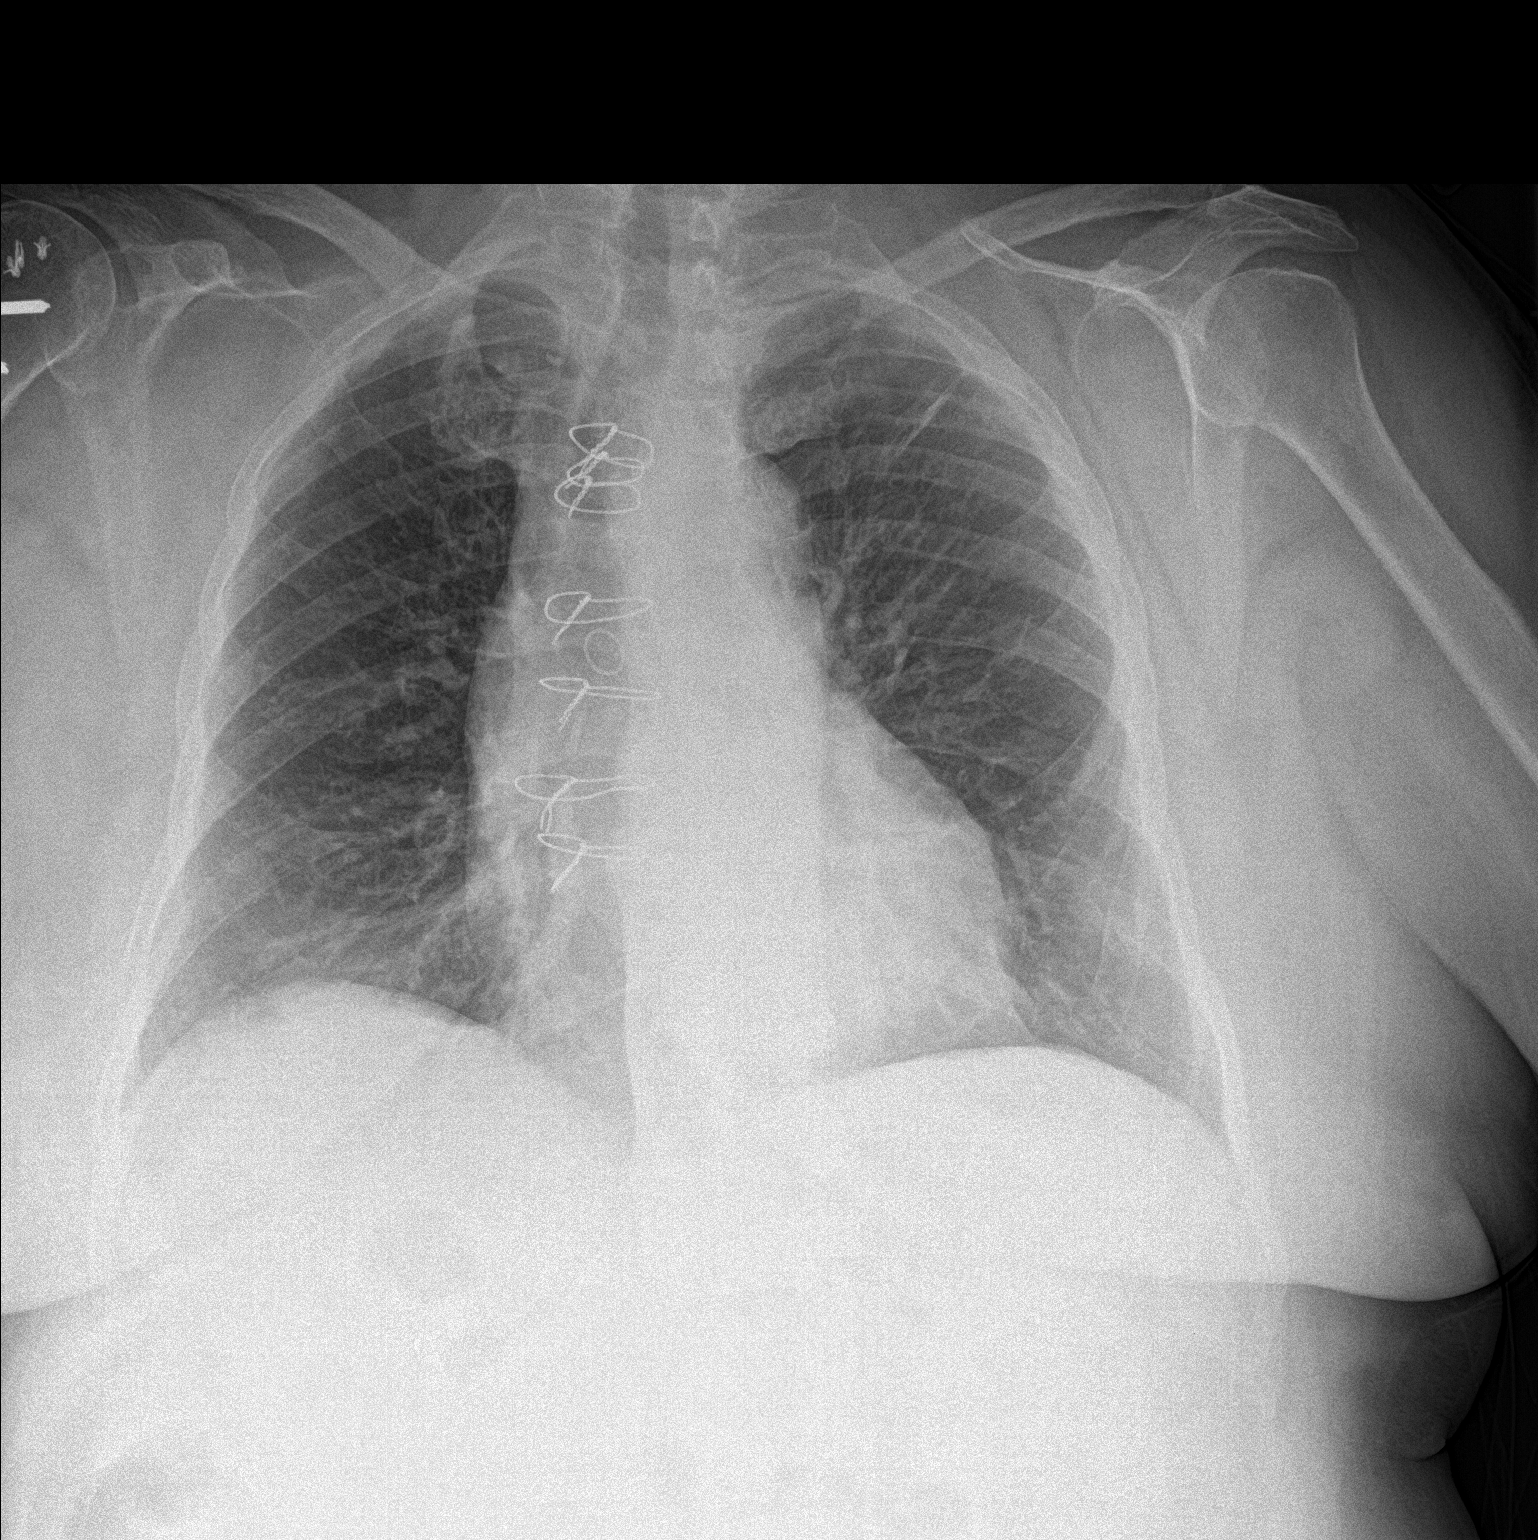

[chest lat]
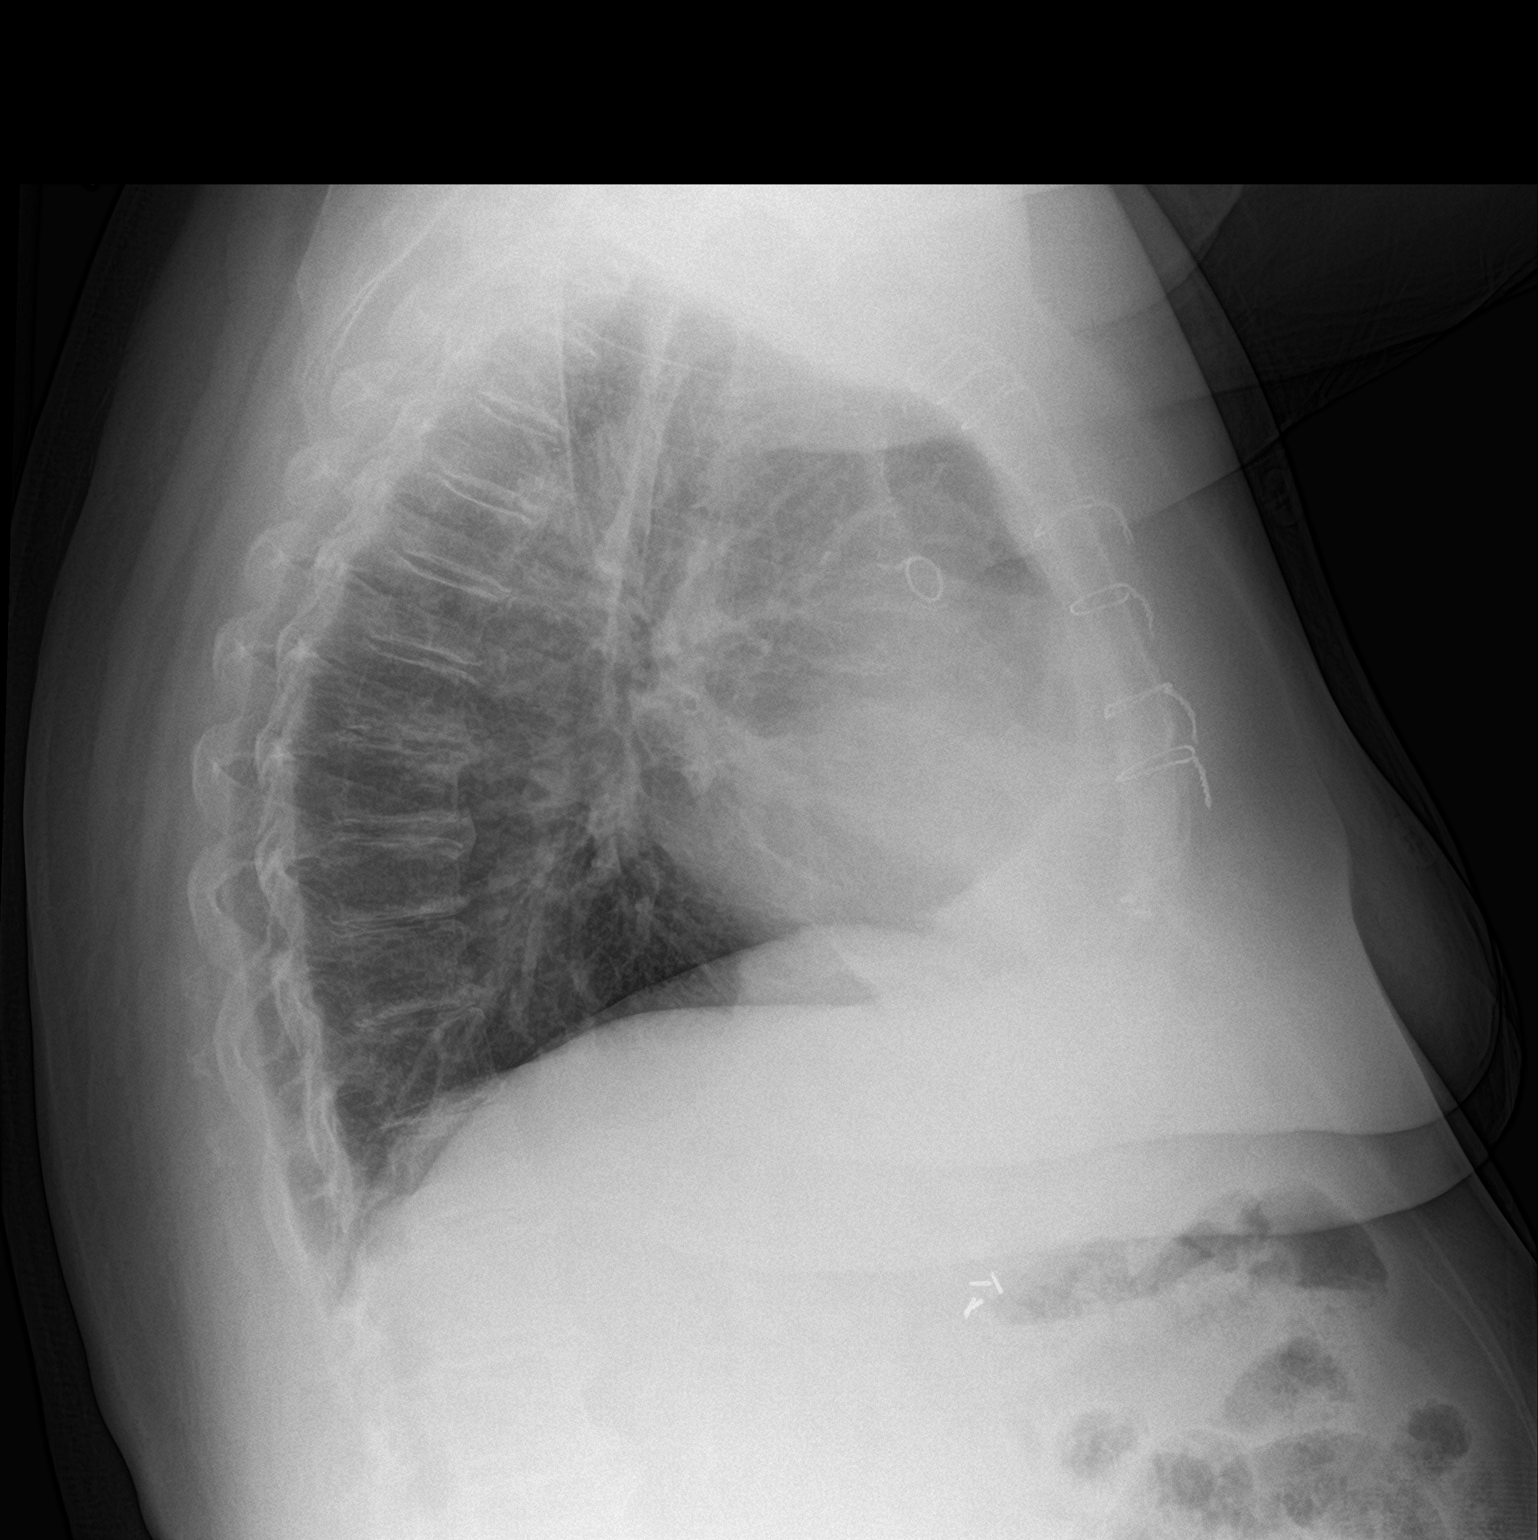

[2 of 2 positions shown; findings below may reference images not displayed]

FINDINGS: Stable cardiac and mediastinal contours status post median
sternotomy and CABG procedure. No consolidative pulmonary opacity.
No pleural effusion or pneumothorax. Thoracic spine degenerative
changes. Cholecystectomy clips. Surgical hardware involving the
right humerus.
IMPRESSION: No acute cardiopulmonary process.

## 2018-04-26 IMAGING — DX DG CHEST 2V
2 series · 2 of 2 positions shown · non-contrast
Comparison: 04/20/2016

CLINICAL DATA: Shortness of breath. Patient was here 2 weeks ago
for congestion in was given antibiotics without relief. Cough.

EXAM:
CHEST  2 VIEW

[chest pa]
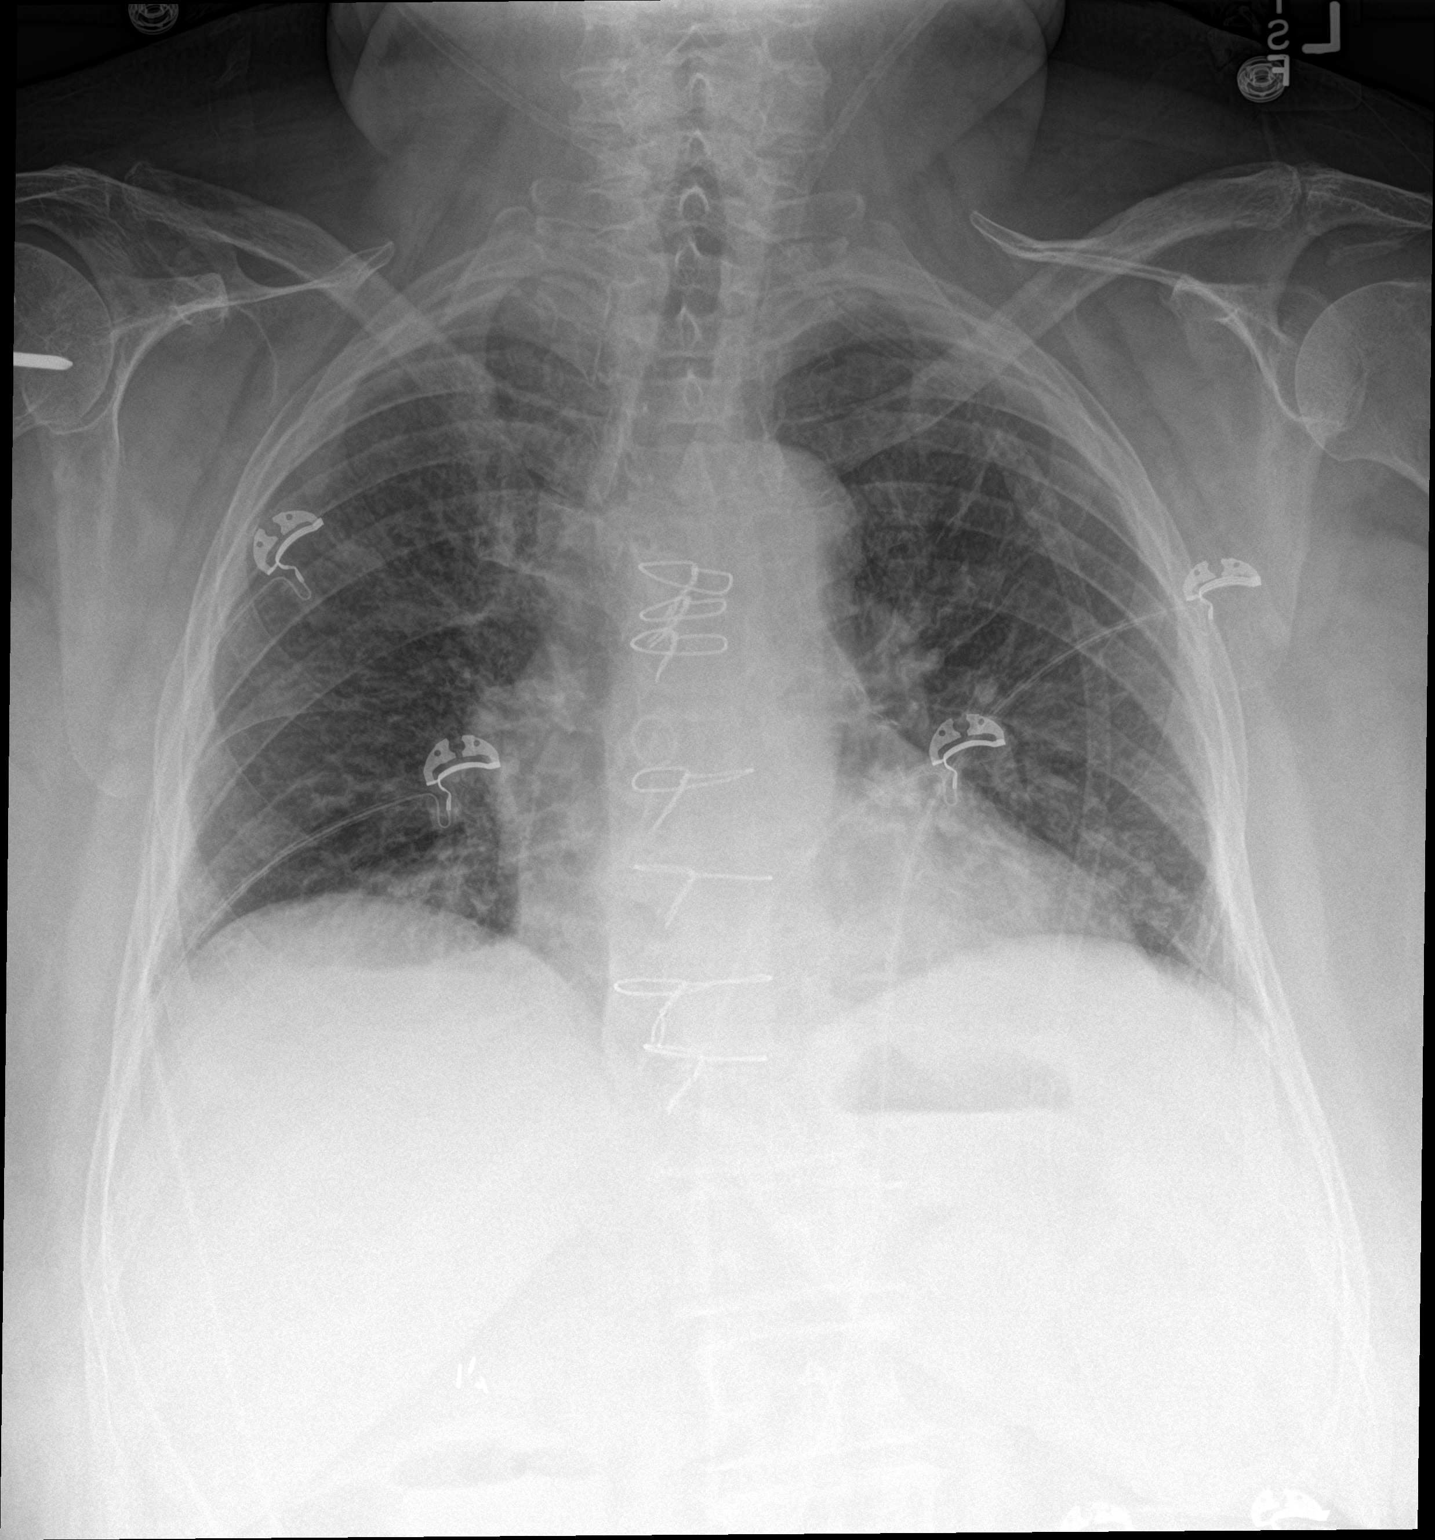

[chest lat]
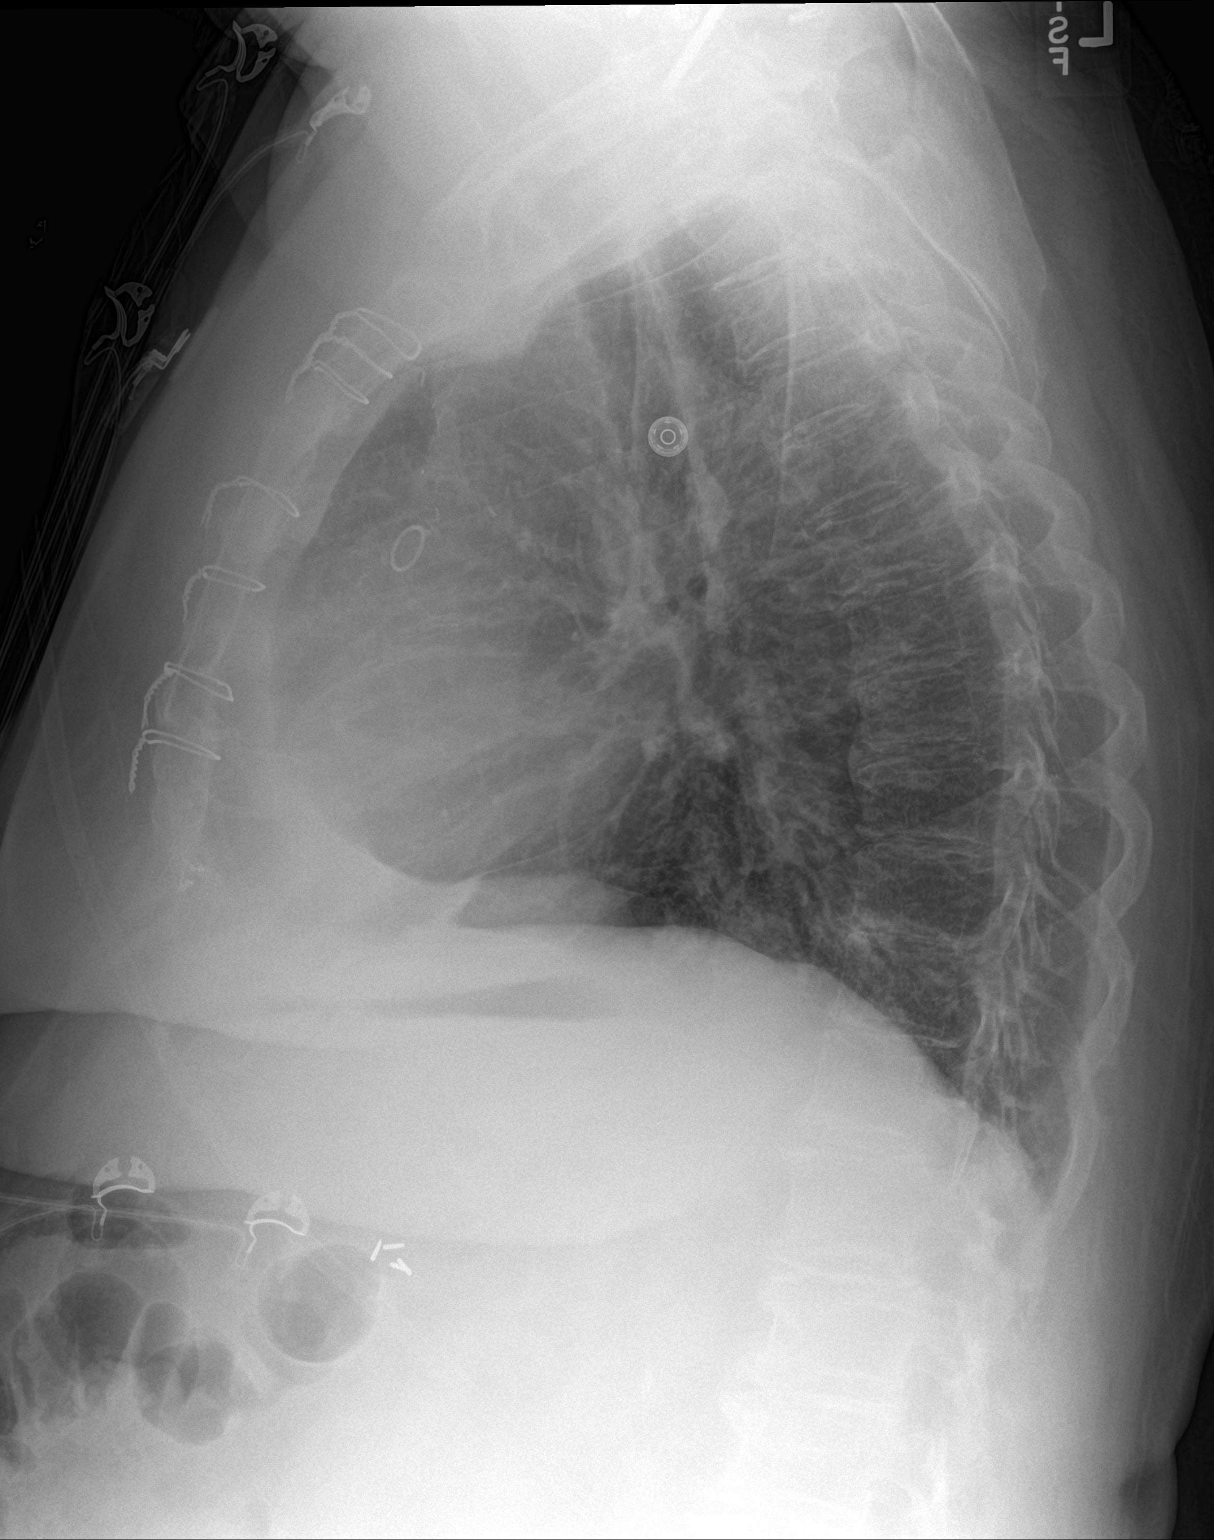

[2 of 2 positions shown; findings below may reference images not displayed]

FINDINGS: Postoperative changes in the mediastinum. Shallow inspiration.
Atelectasis in the lung bases. Normal heart size and pulmonary
vascularity. No focal airspace disease or consolidation. No blunting
of costophrenic angles. No pneumothorax. Degenerative changes in the
spine.
IMPRESSION: Shallow inspiration with atelectasis in the lung bases.

## 2018-06-10 ENCOUNTER — Other Ambulatory Visit: Payer: Self-pay

## 2018-06-10 ENCOUNTER — Emergency Department (HOSPITAL_COMMUNITY): Payer: Medicare HMO

## 2018-06-10 ENCOUNTER — Inpatient Hospital Stay (HOSPITAL_COMMUNITY)
Admission: EM | Admit: 2018-06-10 | Discharge: 2018-06-12 | DRG: 690 | Disposition: A | Discharge status: Home Health | Payer: Medicare HMO | Attending: Family Medicine | Admitting: Family Medicine

## 2018-06-10 ENCOUNTER — Encounter (HOSPITAL_COMMUNITY): Payer: Self-pay | Admitting: Emergency Medicine

## 2018-06-10 DIAGNOSIS — E860 Dehydration: Secondary | ICD-10-CM | POA: Diagnosis present

## 2018-06-10 DIAGNOSIS — I69398 Other sequelae of cerebral infarction: Secondary | ICD-10-CM | POA: Diagnosis not present

## 2018-06-10 DIAGNOSIS — Z79891 Long term (current) use of opiate analgesic: Secondary | ICD-10-CM

## 2018-06-10 DIAGNOSIS — R739 Hyperglycemia, unspecified: Secondary | ICD-10-CM | POA: Diagnosis not present

## 2018-06-10 DIAGNOSIS — I5032 Chronic diastolic (congestive) heart failure: Secondary | ICD-10-CM | POA: Diagnosis present

## 2018-06-10 DIAGNOSIS — J449 Chronic obstructive pulmonary disease, unspecified: Secondary | ICD-10-CM | POA: Diagnosis present

## 2018-06-10 DIAGNOSIS — I5189 Other ill-defined heart diseases: Secondary | ICD-10-CM | POA: Diagnosis not present

## 2018-06-10 DIAGNOSIS — J411 Mucopurulent chronic bronchitis: Secondary | ICD-10-CM | POA: Diagnosis not present

## 2018-06-10 DIAGNOSIS — M549 Dorsalgia, unspecified: Secondary | ICD-10-CM | POA: Diagnosis present

## 2018-06-10 DIAGNOSIS — Y92002 Bathroom of unspecified non-institutional (private) residence single-family (private) house as the place of occurrence of the external cause: Secondary | ICD-10-CM

## 2018-06-10 DIAGNOSIS — R531 Weakness: Secondary | ICD-10-CM | POA: Diagnosis present

## 2018-06-10 DIAGNOSIS — B9620 Unspecified Escherichia coli [E. coli] as the cause of diseases classified elsewhere: Secondary | ICD-10-CM | POA: Diagnosis present

## 2018-06-10 DIAGNOSIS — Z9119 Patient's noncompliance with other medical treatment and regimen: Secondary | ICD-10-CM

## 2018-06-10 DIAGNOSIS — I2510 Atherosclerotic heart disease of native coronary artery without angina pectoris: Secondary | ICD-10-CM | POA: Diagnosis present

## 2018-06-10 DIAGNOSIS — I083 Combined rheumatic disorders of mitral, aortic and tricuspid valves: Secondary | ICD-10-CM | POA: Diagnosis present

## 2018-06-10 DIAGNOSIS — Z794 Long term (current) use of insulin: Secondary | ICD-10-CM | POA: Diagnosis not present

## 2018-06-10 DIAGNOSIS — E7801 Familial hypercholesterolemia: Secondary | ICD-10-CM

## 2018-06-10 DIAGNOSIS — G4733 Obstructive sleep apnea (adult) (pediatric): Secondary | ICD-10-CM | POA: Diagnosis not present

## 2018-06-10 DIAGNOSIS — M5136 Other intervertebral disc degeneration, lumbar region: Secondary | ICD-10-CM | POA: Diagnosis present

## 2018-06-10 DIAGNOSIS — R55 Syncope and collapse: Secondary | ICD-10-CM | POA: Diagnosis present

## 2018-06-10 DIAGNOSIS — R4020 Unspecified coma: Secondary | ICD-10-CM | POA: Diagnosis present

## 2018-06-10 DIAGNOSIS — I10 Essential (primary) hypertension: Secondary | ICD-10-CM | POA: Diagnosis not present

## 2018-06-10 DIAGNOSIS — Z79899 Other long term (current) drug therapy: Secondary | ICD-10-CM

## 2018-06-10 DIAGNOSIS — E1142 Type 2 diabetes mellitus with diabetic polyneuropathy: Secondary | ICD-10-CM | POA: Diagnosis present

## 2018-06-10 DIAGNOSIS — E039 Hypothyroidism, unspecified: Secondary | ICD-10-CM | POA: Diagnosis present

## 2018-06-10 DIAGNOSIS — R9082 White matter disease, unspecified: Secondary | ICD-10-CM | POA: Diagnosis present

## 2018-06-10 DIAGNOSIS — R001 Bradycardia, unspecified: Secondary | ICD-10-CM | POA: Diagnosis present

## 2018-06-10 DIAGNOSIS — R072 Precordial pain: Secondary | ICD-10-CM | POA: Diagnosis not present

## 2018-06-10 DIAGNOSIS — N390 Urinary tract infection, site not specified: Principal | ICD-10-CM | POA: Diagnosis present

## 2018-06-10 DIAGNOSIS — I440 Atrioventricular block, first degree: Secondary | ICD-10-CM | POA: Diagnosis present

## 2018-06-10 DIAGNOSIS — M1990 Unspecified osteoarthritis, unspecified site: Secondary | ICD-10-CM | POA: Diagnosis present

## 2018-06-10 DIAGNOSIS — E785 Hyperlipidemia, unspecified: Secondary | ICD-10-CM | POA: Diagnosis present

## 2018-06-10 DIAGNOSIS — W1830XA Fall on same level, unspecified, initial encounter: Secondary | ICD-10-CM | POA: Diagnosis present

## 2018-06-10 DIAGNOSIS — Z7982 Long term (current) use of aspirin: Secondary | ICD-10-CM

## 2018-06-10 DIAGNOSIS — G8929 Other chronic pain: Secondary | ICD-10-CM | POA: Diagnosis present

## 2018-06-10 DIAGNOSIS — Z955 Presence of coronary angioplasty implant and graft: Secondary | ICD-10-CM

## 2018-06-10 DIAGNOSIS — H539 Unspecified visual disturbance: Secondary | ICD-10-CM | POA: Diagnosis present

## 2018-06-10 DIAGNOSIS — I110 Hypertensive heart disease with heart failure: Secondary | ICD-10-CM | POA: Diagnosis present

## 2018-06-10 DIAGNOSIS — I252 Old myocardial infarction: Secondary | ICD-10-CM

## 2018-06-10 DIAGNOSIS — Z791 Long term (current) use of non-steroidal anti-inflammatories (NSAID): Secondary | ICD-10-CM

## 2018-06-10 DIAGNOSIS — I25708 Atherosclerosis of coronary artery bypass graft(s), unspecified, with other forms of angina pectoris: Secondary | ICD-10-CM | POA: Diagnosis not present

## 2018-06-10 DIAGNOSIS — E118 Type 2 diabetes mellitus with unspecified complications: Secondary | ICD-10-CM | POA: Diagnosis present

## 2018-06-10 DIAGNOSIS — Z951 Presence of aortocoronary bypass graft: Secondary | ICD-10-CM

## 2018-06-10 DIAGNOSIS — E1165 Type 2 diabetes mellitus with hyperglycemia: Secondary | ICD-10-CM | POA: Diagnosis present

## 2018-06-10 DIAGNOSIS — Z87448 Personal history of other diseases of urinary system: Secondary | ICD-10-CM

## 2018-06-10 DIAGNOSIS — Z885 Allergy status to narcotic agent status: Secondary | ICD-10-CM

## 2018-06-10 DIAGNOSIS — G4730 Sleep apnea, unspecified: Secondary | ICD-10-CM | POA: Diagnosis present

## 2018-06-10 LAB — CBC WITH DIFFERENTIAL/PLATELET
BASOS ABS: 0 10*3/uL (ref 0.0–0.1)
BASOS PCT: 1 %
Eosinophils Absolute: 0.4 10*3/uL (ref 0.0–0.7)
Eosinophils Relative: 5 %
HEMATOCRIT: 41.6 % (ref 36.0–46.0)
Hemoglobin: 12.6 g/dL (ref 12.0–15.0)
LYMPHS PCT: 28 %
Lymphs Abs: 2.3 10*3/uL (ref 0.7–4.0)
MCH: 26.3 pg (ref 26.0–34.0)
MCHC: 30.3 g/dL (ref 30.0–36.0)
MCV: 86.8 fL (ref 78.0–100.0)
MONOS PCT: 10 %
Monocytes Absolute: 0.8 10*3/uL (ref 0.1–1.0)
NEUTROS ABS: 4.6 10*3/uL (ref 1.7–7.7)
NEUTROS PCT: 56 %
Platelets: 252 10*3/uL (ref 150–400)
RBC: 4.79 MIL/uL (ref 3.87–5.11)
RDW: 14.5 % (ref 11.5–15.5)
WBC: 8.2 10*3/uL (ref 4.0–10.5)

## 2018-06-10 LAB — LIPID PANEL
CHOL/HDL RATIO: 6.1 ratio
Cholesterol: 206 mg/dL — ABNORMAL HIGH (ref 0–200)
HDL: 34 mg/dL — AB (ref >40)
LDL Cholesterol: 100 mg/dL — ABNORMAL HIGH (ref 0–99)
Triglycerides: 360 mg/dL — ABNORMAL HIGH (ref <150)
VLDL: 72 mg/dL — ABNORMAL HIGH (ref 0–40)

## 2018-06-10 LAB — CBG MONITORING, ED: GLUCOSE-CAPILLARY: 271 mg/dL — AB (ref 65–99)

## 2018-06-10 LAB — BASIC METABOLIC PANEL
Anion gap: 7 (ref 5–15)
BUN: 17 mg/dL (ref 6–20)
CHLORIDE: 104 mmol/L (ref 101–111)
CO2: 28 mmol/L (ref 22–32)
Calcium: 9.2 mg/dL (ref 8.9–10.3)
Creatinine, Ser: 0.85 mg/dL (ref 0.44–1.00)
GFR calc non Af Amer: >60 mL/min (ref >60)
Glucose, Bld: 278 mg/dL — ABNORMAL HIGH (ref 65–99)
POTASSIUM: 4.4 mmol/L (ref 3.5–5.1)
SODIUM: 139 mmol/L (ref 135–145)

## 2018-06-10 LAB — TROPONIN I
Troponin I: <0.03 ng/mL (ref <0.03)
Troponin I: <0.03 ng/mL (ref <0.03)

## 2018-06-10 LAB — URINALYSIS, ROUTINE W REFLEX MICROSCOPIC
Bilirubin Urine: NEGATIVE
Hgb urine dipstick: NEGATIVE
KETONES UR: NEGATIVE mg/dL
Nitrite: POSITIVE — AB
Protein, ur: NEGATIVE mg/dL
Specific Gravity, Urine: 1.022 (ref 1.005–1.030)
WBC, UA: >50 WBC/hpf — ABNORMAL HIGH (ref 0–5)
pH: 5 (ref 5.0–8.0)

## 2018-06-10 LAB — RAPID URINE DRUG SCREEN, HOSP PERFORMED
Amphetamines: NOT DETECTED
BENZODIAZEPINES: NOT DETECTED
COCAINE: NOT DETECTED
Opiates: NOT DETECTED
Tetrahydrocannabinol: NOT DETECTED

## 2018-06-10 LAB — HEPATIC FUNCTION PANEL
ALBUMIN: 3.5 g/dL (ref 3.5–5.0)
ALK PHOS: 79 U/L (ref 38–126)
ALT: 13 U/L — AB (ref 14–54)
AST: 17 U/L (ref 15–41)
Bilirubin, Direct: <0.1 mg/dL — ABNORMAL LOW (ref 0.1–0.5)
TOTAL PROTEIN: 6.1 g/dL — AB (ref 6.5–8.1)
Total Bilirubin: 0.5 mg/dL (ref 0.3–1.2)

## 2018-06-10 LAB — GLUCOSE, CAPILLARY
GLUCOSE-CAPILLARY: 196 mg/dL — AB (ref 65–99)
Glucose-Capillary: 170 mg/dL — ABNORMAL HIGH (ref 65–99)

## 2018-06-10 LAB — TSH: TSH: 2.526 u[IU]/mL (ref 0.350–4.500)

## 2018-06-10 MED ORDER — SODIUM CHLORIDE 0.9 % IV SOLN
1.0000 g | Freq: Once | INTRAVENOUS | Status: AC
  Administered 2018-06-10: 1 g via INTRAVENOUS
  Filled 2018-06-10: qty 10

## 2018-06-10 MED ORDER — ASPIRIN EC 325 MG PO TBEC
325.0000 mg | DELAYED_RELEASE_TABLET | Freq: Every day | ORAL | Status: DC
  Administered 2018-06-10 – 2018-06-12 (×3): 325 mg via ORAL
  Filled 2018-06-10 (×3): qty 1

## 2018-06-10 MED ORDER — SODIUM CHLORIDE 0.9 % IV SOLN
INTRAVENOUS | Status: DC
  Administered 2018-06-10 – 2018-06-11 (×2): via INTRAVENOUS

## 2018-06-10 MED ORDER — LISINOPRIL 10 MG PO TABS
10.0000 mg | ORAL_TABLET | Freq: Every day | ORAL | Status: DC
  Administered 2018-06-10 – 2018-06-12 (×3): 10 mg via ORAL
  Filled 2018-06-10 (×3): qty 1

## 2018-06-10 MED ORDER — SENNOSIDES-DOCUSATE SODIUM 8.6-50 MG PO TABS
1.0000 | ORAL_TABLET | Freq: Every day | ORAL | Status: DC
  Administered 2018-06-10 – 2018-06-11 (×2): 1 via ORAL
  Filled 2018-06-10 (×2): qty 1

## 2018-06-10 MED ORDER — ACETAMINOPHEN 650 MG RE SUPP: 650.0000 mg | Freq: Four times a day (QID) | RECTAL | Status: DC | PRN

## 2018-06-10 MED ORDER — OXYCODONE HCL 5 MG PO TABS
5.0000 mg | ORAL_TABLET | ORAL | Status: DC | PRN
  Administered 2018-06-10 – 2018-06-12 (×6): 5 mg via ORAL
  Filled 2018-06-10 (×6): qty 1

## 2018-06-10 MED ORDER — INSULIN ASPART 100 UNIT/ML ~~LOC~~ SOLN
0.0000 [IU] | Freq: Three times a day (TID) | SUBCUTANEOUS | Status: DC
  Administered 2018-06-10: 3 [IU] via SUBCUTANEOUS
  Administered 2018-06-11 (×2): 2 [IU] via SUBCUTANEOUS
  Administered 2018-06-12: 3 [IU] via SUBCUTANEOUS
  Administered 2018-06-12: 5 [IU] via SUBCUTANEOUS

## 2018-06-10 MED ORDER — ONDANSETRON HCL 4 MG PO TABS: 4.0000 mg | ORAL_TABLET | Freq: Four times a day (QID) | ORAL | Status: DC | PRN

## 2018-06-10 MED ORDER — LEVOTHYROXINE SODIUM 25 MCG PO TABS
25.0000 ug | ORAL_TABLET | Freq: Every day | ORAL | Status: DC
  Administered 2018-06-11 – 2018-06-12 (×2): 25 ug via ORAL
  Filled 2018-06-10 (×2): qty 1

## 2018-06-10 MED ORDER — INSULIN ASPART PROT & ASPART (70-30 MIX) 100 UNIT/ML ~~LOC~~ SUSP
40.0000 [IU] | Freq: Two times a day (BID) | SUBCUTANEOUS | Status: DC
  Administered 2018-06-10 – 2018-06-12 (×4): 40 [IU] via SUBCUTANEOUS
  Filled 2018-06-10: qty 10

## 2018-06-10 MED ORDER — SODIUM CHLORIDE 0.9 % IV BOLUS
500.0000 mL | Freq: Once | INTRAVENOUS | Status: AC
  Administered 2018-06-10: 500 mL via INTRAVENOUS

## 2018-06-10 MED ORDER — ACETAMINOPHEN 325 MG PO TABS
650.0000 mg | ORAL_TABLET | Freq: Four times a day (QID) | ORAL | Status: DC | PRN
  Filled 2018-06-10: qty 2

## 2018-06-10 MED ORDER — ONDANSETRON HCL 4 MG/2ML IJ SOLN: 4.0000 mg | Freq: Four times a day (QID) | INTRAMUSCULAR | Status: DC | PRN

## 2018-06-10 MED ORDER — IPRATROPIUM-ALBUTEROL 0.5-2.5 (3) MG/3ML IN SOLN: 3.0000 mL | Freq: Four times a day (QID) | RESPIRATORY_TRACT | Status: DC | PRN

## 2018-06-10 MED ORDER — GABAPENTIN 300 MG PO CAPS
300.0000 mg | ORAL_CAPSULE | Freq: Two times a day (BID) | ORAL | Status: DC
  Administered 2018-06-10 – 2018-06-12 (×4): 300 mg via ORAL
  Filled 2018-06-10 (×4): qty 1

## 2018-06-10 MED ORDER — AMLODIPINE BESYLATE 5 MG PO TABS
5.0000 mg | ORAL_TABLET | Freq: Every day | ORAL | Status: DC
  Administered 2018-06-10 – 2018-06-12 (×3): 5 mg via ORAL
  Filled 2018-06-10 (×3): qty 1

## 2018-06-10 MED ORDER — SODIUM CHLORIDE 0.9 % IV SOLN
1.0000 g | INTRAVENOUS | Status: DC
  Administered 2018-06-11: 1 g via INTRAVENOUS
  Filled 2018-06-10: qty 1
  Filled 2018-06-10 (×3): qty 10

## 2018-06-10 MED ORDER — HYDRALAZINE HCL 20 MG/ML IJ SOLN
10.0000 mg | INTRAMUSCULAR | Status: DC | PRN
  Administered 2018-06-11: 10 mg via INTRAVENOUS
  Filled 2018-06-10: qty 1

## 2018-06-10 MED ORDER — NITROGLYCERIN 0.4 MG SL SUBL: 0.4000 mg | SUBLINGUAL_TABLET | SUBLINGUAL | Status: DC | PRN

## 2018-06-10 MED ORDER — ENOXAPARIN SODIUM 40 MG/0.4ML ~~LOC~~ SOLN
40.0000 mg | SUBCUTANEOUS | Status: DC
  Administered 2018-06-10 – 2018-06-11 (×2): 40 mg via SUBCUTANEOUS
  Filled 2018-06-10 (×2): qty 0.4

## 2018-06-10 NOTE — ED Notes (Signed)
Assisted patient to bedside commode. Patient unsteady upon standing.

## 2018-06-10 NOTE — Progress Notes (Signed)
Patient refused CPAP, no unit in room. Started on 2 lpm nasal cannula for bedtime.

## 2018-06-10 NOTE — ED Provider Notes (Signed)
[facility] EMERGENCY DEPARTMENT Provider Note   CSN: [id number] Arrival date & time: 06/10/18  1102     History   Chief Complaint Chief Complaint  Patient presents with  . Loss of Consciousness    HPI [name] [name] is a 82 y.o. female.  Patient states that she is felt weak today and she passed out.  Patient also complains of being sleepy  The history is provided by the patient. No language interpreter was used.  Loss of Consciousness   This is a new problem. The current episode started less than 1 hour ago. The problem occurs rarely. The problem has been resolved. She lost consciousness for a period of less than one minute. The problem is associated with normal activity. Associated symptoms include dizziness and visual change. Pertinent negatives include abdominal pain, back pain, chest pain, congestion, headaches and seizures. She has tried nothing for the symptoms. The treatment provided significant relief. Her past medical history is significant for CAD.    Past Medical History:  Diagnosis Date  . Arthritis   . CAD (coronary artery disease)    Multivessel status post CABG 2001  . Chronic obstructive pulmonary disease (HCC)   . DDD (degenerative disc disease), lumbar   . Diastolic CHF (HCC)   . Essential hypertension   . Hematuria   . History of CVA (cerebrovascular accident)    2013 - lacunar infarct right thalamus - residual left side of  lip numb  and left eye vision worse (which corrented lens implant from cataract extraction)  . History of MI (myocardial infarction)    July 2001  . Hyperlipidemia   . Hypothyroidism   . Insulin dependent type 2 diabetes mellitus (HCC)   . Lesion of bladder   . Lower urinary tract symptoms (LUTS)   . OSA (obstructive sleep apnea)   . Peripheral neuropathy   . Psoriasis   . SUI (stress urinary incontinence, female)   . Varicose veins     Patient Active Problem List   Diagnosis Date Noted  . Loss of consciousness (HCC)  06/10/2018  . Anemia 09/29/2017  . Atypical chest pain 09/29/2017  . Diastolic dysfunction 09/29/2017  . CAD (coronary artery disease) 09/28/2017  . Acute lower UTI 09/28/2017  . Hyperkalemia 09/28/2017  . Ischemic stroke (HCC) 05/10/2017  . Acute ischemic stroke (HCC) 05/10/2017  . Stroke (HCC) 08/01/2016  . Dysarthria 03/18/2016  . Influenza-like illness 11/19/2015  . Anemia of chronic disease 11/19/2015  . OSA (obstructive sleep apnea) 11/19/2015  . Left facial numbness 08/29/2015  . Gait instability 06/26/2015  . Bronchitis 11/13/2014  . COPD exacerbation (HCC) 11/13/2014  . UTI (urinary tract infection) 08/26/2014  . Near syncope 08/24/2014  . UTI (lower urinary tract infection) 12/10/2013  . Hypoxia 12/10/2013  . Weakness generalized 12/10/2013  . TIA (transient ischemic attack) 06/22/2012  . Left-sided headache 06/22/2012  . Noncompliance 06/22/2012  . Noncompliance with CPAP treatment 06/22/2012  . Psoriasis 01/07/2012  . Cerebrovascular disease   . Chronic back pain   . Chronic obstructive pulmonary disease (HCC)   . Chest pain 08/28/2011  . DM type 2 causing complication (HCC) 02/14/2010  . Obesity 02/14/2010  . Arteriosclerotic cardiovascular disease (ASCVD) 02/14/2010  . Hypothyroidism 02/06/2010  . Hyperlipidemia 02/06/2010  . Essential hypertension 02/06/2010  . Sleep apnea 02/06/2010    Past Surgical History:  Procedure Laterality Date  . APPENDECTOMY  age 35  . CARDIAC CATHETERIZATION  1991   No sig. cad  . CARDIAC CATHETERIZATION  07-01-2000   high grade in-stent restenosis  . CARDIAC CATHETERIZATION  04-30-2001   single native vessel cad/ graft patent  . CARDIAC CATHETERIZATION  02-13-2006  dr [place] cad with total occluded LAD  and  70% ostial of small ramus/  grafts patent/  normal ef  . CARDIAC CATHETERIZATION  01-20-2000  dr Clifton James   essentially unchanged;  severe single vessel cad with diffuse disease throughout CFX and RCA/ ef 66%;   chf with preserved LVSF  . CARDIOVASCULAR STRESS TEST  08-28-2011   DR ROTHBART   NEGATIVE NUCLEAR STUDY/  GLOBAL LVSF/  EF 65%  . CARPAL TUNNEL RELEASE Bilateral yrs ago  . CATARACT EXTRACTION W/ INTRAOCULAR LENS  IMPLANT, BILATERAL  2013  . CHOLECYSTECTOMY  1990  . COLONOSCOPY N/A 07/26/2015   Procedure: COLONOSCOPY;  Surgeon: Malissa Hippo, MD;  Location: [facility];  Service: Endoscopy;  Laterality: N/A;  1200  . CORONARY ANGIOPLASTY  11-13-1999   balloon angioplasty to mLAD , d2 of LAD  . CORONARY ANGIOPLASTY WITH STENT PLACEMENT  01-17-2000   PCI stenting to mLAD & D2 of LAD  . CORONARY ARTERY BYPASS GRAFT  07-02-2000   DR Tressie Stalker   SVG to diagonal, LIMA to LAD  . CYSTOSCOPY W/ RETROGRADES Bilateral 08/09/2014   Procedure: CYSTOSCOPY, BILATERAL RETROGRADE, HYDRODISTENSION, BLADDER BIOPSY WITH FULGERATION, INSTILL PYRIDIUM AND MARCAINE ;  Surgeon: Anner Crete, MD;  Location: [facility];  Service: Urology;  Laterality: Bilateral;  . KNEE ARTHROSCOPY Left 10-18-2003   SYNOVECTOMY  . LUMBAR SPINE SURGERY  x2    yrs ago  . ORIF HUMERUS FRACTURE Right 04-18-2008  . REPAIR FLEXOR TENDON HAND Right 09-20-2008   CARPI RADIALIS TENDON TRANSFER TO EXTENSOR TO INDEX, MIDDLE, RING , LITTLE FINGERS AND DIGITI MININI  . ROTATOR CUFF REPAIR Right 1995  . TOTAL ABDOMINAL HYSTERECTOMY W/ BILATERAL SALPINGOOPHORECTOMY  age 55  . TRANSTHORACIC ECHOCARDIOGRAM  12-12-2013   MILD LVH/  EF 60-65%/  GRADE I DIASTOLIC DYSFUNCTION/  MILD LAE     OB History    Gravida  7   Para  7   Term  7   Preterm      AB      Living  4     SAB      TAB      Ectopic      Multiple      Live Births               Home Medications    Prior to Admission medications   Medication Sig Start Date End Date Taking? Authorizing Provider  amLODipine (NORVASC) 5 MG tablet Take 5 mg by mouth daily. 07/03/17  Yes [provider]  aspirin EC 325 MG tablet Take 325 mg by  mouth daily.   Yes [provider]  gabapentin (NEURONTIN) 300 MG capsule Take 300 mg by mouth 2 (two) times daily. 09/18/17  Yes [provider]  insulin NPH-regular Human (NOVOLIN 70/30) (70-30) 100 UNIT/ML injection Inject 40 Units into the skin 2 (two) times daily.   Yes [provider]  levothyroxine (SYNTHROID, LEVOTHROID) 25 MCG tablet Take 25 mcg by mouth daily.    Yes [provider]  lisinopril (PRINIVIL,ZESTRIL) 10 MG tablet Take 10 mg by mouth daily.   Yes [provider]  metFORMIN (GLUCOPHAGE) 500 MG tablet Take 500 mg by mouth 2 (two) times daily with a meal.     Yes [provider]  naproxen (NAPROSYN) 500 MG tablet Take 1 tablet by mouth 2 (two) times daily. 05/25/18  Yes [provider]  Oxycodone HCl 10 MG TABS Take 5 mg by mouth every 4 (four) hours.  02/20/18  Yes [provider]  triamcinolone cream (KENALOG) 0.1 % APPLY TO AFFECTED AREA TWICE A DAY AS DIRECTED FOR FLARES 05/04/18  Yes [provider]  ipratropium-albuterol (DUONEB) 0.5-2.5 (3) MG/3ML SOLN Inhale 3 mLs into the lungs 4 (four) times daily as needed. 09/19/17   [provider]  levofloxacin (LEVAQUIN) 500 MG tablet Take 1 tablet (500 mg total) by mouth daily. 02/28/18   Eber Hong, MD  nitroGLYCERIN (NITROSTAT) 0.4 MG SL tablet Place 0.4 mg under the tongue every 5 (five) minutes as needed for chest pain.     [provider]    Family History Family History  Problem Relation Age of Onset  . Hypertension Mother   . Stroke Mother   . Coronary artery disease Unknown        Multiple first and second-degree relatives  . Aneurysm Unknown        Cerebral circulation  . Colon cancer Sister   . Stroke Sister   . Coronary artery disease Sister   . Clotting disorder Unknown        Children diagnosed with hypercoagulable state  . Coronary artery disease Brother     Social History Social History   Tobacco Use  .  Smoking status: Never Smoker  . Smokeless tobacco: Never Used  Substance Use Topics  . Alcohol use: No  . Drug use: No     Allergies   Codeine   Review of Systems Review of Systems  Constitutional: Negative for appetite change and fatigue.  HENT: Negative for congestion, ear discharge and sinus pressure.   Eyes: Negative for discharge.  Respiratory: Negative for cough.   Cardiovascular: Positive for syncope. Negative for chest pain.  Gastrointestinal: Negative for abdominal pain and diarrhea.  Genitourinary: Negative for frequency and hematuria.  Musculoskeletal: Negative for back pain.  Skin: Negative for rash.  Neurological: Positive for dizziness. Negative for seizures and headaches.  Psychiatric/Behavioral: Negative for hallucinations.     Physical Exam Updated Vital Signs BP 136/68   Pulse (!) 55   Temp 99 F (37.2 C) (Oral)   Resp 17   Ht 5\' 4"  (1.626 m)   Wt 81.6 kg (180 lb)   SpO2 94%   BMI 30.90 kg/m   Physical Exam  Constitutional: She is oriented to person, place, and time. She appears well-developed.  HENT:  Head: Normocephalic.  Eyes: Conjunctivae and EOM are normal. No scleral icterus.  Neck: Neck supple. No thyromegaly present.  Cardiovascular: Normal rate and regular rhythm. Exam reveals no gallop and no friction rub.  No murmur heard. Pulmonary/Chest: No stridor. She has no wheezes. She has no rales. She exhibits no tenderness.  Abdominal: She exhibits no distension. There is no tenderness. There is no rebound.  Musculoskeletal: Normal range of motion. She exhibits no edema.  Lymphadenopathy:    She has no cervical adenopathy.  Neurological: She is oriented to person, place, and time. She exhibits normal muscle tone. Coordination normal.  Mildly lethargic  Skin: No rash noted. No erythema.  Psychiatric: She has a normal mood and affect. Her behavior is normal.     ED Treatments / Results  Labs (all labs ordered are listed, but only  abnormal results are displayed) Labs Reviewed  BASIC METABOLIC PANEL - Abnormal;  Notable for the following components:      Result Value   Glucose, Bld 278 (*)    All other components within normal limits  HEPATIC FUNCTION PANEL - Abnormal; Notable for the following components:   Total Protein 6.1 (*)    ALT 13 (*)    Bilirubin, Direct <0.1 (*)    All other components within normal limits  URINALYSIS, ROUTINE W REFLEX MICROSCOPIC - Abnormal; Notable for the following components:   APPearance HAZY (*)    Glucose, UA >=500 (*)    Nitrite POSITIVE (*)    Leukocytes, UA LARGE (*)    WBC, UA >50 (*)    Bacteria, UA MANY (*)    All other components within normal limits  RAPID URINE DRUG SCREEN, HOSP PERFORMED - Abnormal; Notable for the following components:   Barbiturates   (*)    Value: Result not available. Reagent lot number recalled by manufacturer.   All other components within normal limits  CBG MONITORING, ED - Abnormal; Notable for the following components:   Glucose-Capillary 271 (*)    All other components within normal limits  URINE CULTURE  CBC WITH DIFFERENTIAL/PLATELET  TROPONIN I    EKG EKG Interpretation  Date/Time:  Wednesday June 10 2018 11:18:39 EDT Ventricular Rate:  58 PR Interval:    QRS Duration: 106 QT Interval:  430 QTC Calculation: 423 R Axis:   -72 Text Interpretation:  Sinus rhythm Prolonged PR interval Inferior infarct, old Anterior infarct, old Confirmed by Bethann BerkshireZammit, Reizy Dunlow [phone]([id number]) on 06/10/2018 11:29:21 AM   Radiology Dg Chest 2 View  Result Date: 06/10/2018 CLINICAL DATA:  Weakness. EXAM: CHEST - 2 VIEW COMPARISON:  Radiographs of February 28, 2018. FINDINGS: The heart size and mediastinal contours are within normal limits. Status post coronary artery bypass graft. No pneumothorax or pleural effusion is noted. Both lungs are clear. The visualized skeletal structures are unremarkable. IMPRESSION: No active cardiopulmonary disease. Electronically  Signed   By: Lupita RaiderJames  Green Jr, M.D.   On: 06/10/2018 12:29   Ct Head Wo Contrast  Result Date: 06/10/2018 CLINICAL DATA:  Altered level of consciousness. EXAM: CT HEAD WITHOUT CONTRAST TECHNIQUE: Contiguous axial images were obtained from the base of the skull through the vertex without intravenous contrast. COMPARISON:  CT scan of May 10, 2017. FINDINGS: Brain: Mild chronic ischemic white matter disease is noted. Mild diffuse cortical atrophy is noted. No mass effect or midline shift is noted. Ventricular size is within normal limits. There is no evidence of mass lesion, hemorrhage or acute infarction. Vascular: No hyperdense vessel or unexpected calcification. Skull: Normal. Negative for fracture or focal lesion. Sinuses/Orbits: No acute finding. Other: None. IMPRESSION: Mild chronic ischemic white matter disease. Mild diffuse cortical atrophy. No acute intracranial abnormality seen. Electronically Signed   By: Lupita RaiderJames  Green Jr, M.D.   On: 06/10/2018 12:35    Procedures Procedures (including critical care time)  Medications Ordered in ED Medications  cefTRIAXone (ROCEPHIN) 1 g in sodium chloride 0.9 % 100 mL IVPB (has no administration in time range)  sodium chloride 0.9 % bolus 500 mL (500 mLs Intravenous New Bag/Given 06/10/18 1144)     Initial Impression / Assessment and Plan / ED Course  I have reviewed the triage vital signs and the nursing notes.  Pertinent labs & imaging results that were available during my care of the patient were reviewed by me and considered in my medical decision making (see chart for details).   Urinalysis shows patient to have urinary  tract infection.  CT head unremarkable.  Rest of labs are unremarkable except for glucose elevated.  Patient will be admitted for urinary tract infection  Final Clinical Impressions(s) / ED Diagnoses   Final diagnoses:  Lower urinary tract infectious disease    ED Discharge Orders    None       Bethann Berkshire,  MD 06/10/18 1436

## 2018-06-10 NOTE — H&P (Signed)
History and Physical  KAGAN HIETPAS ZOX:[id number] DOB: February 11, 1936 DOA: 06/10/2018  Referring physician: Estell Harpin, MD PCP: Oval Linsey, MD   Chief Complaint: passed out  HPI: [name] [name] is a 81 y.o. female with a complex past medical history as detailed below but significant for coronary artery disease, COPD, hypertension, diabetes mellitus and hypothyroidism who presented to the emergency department after she apparently had an episode of syncope where she completely lost consciousness twice this morning when she was getting up from her bedroom.  She says that she had no symptoms of chest pain or shortness of breath and had been feeling fairly well with the exception of being sleepy.  The patient says that she does not remember the event but she does remember waking up with her children yelling at her because she had passed out.  She says that the episode happened a second time shortly after that.  She arrived at the emergency department and she was evaluated.  She was found to be bradycardic and she was noted to have normal blood pressure.  She says that she has no history of seizures.  She says that she does not have a history of syncope.  She says that she had never passed out before.   ED course: The patient was evaluated in the ED and noted to have a negative CT head.  Her initial troponin testing has been negative.  The patient was noted to be hyperglycemic.  The patient says that during her syncopal episode her blood sugar was in the normal range when it was tested after the event.  She was noted to have an abnormal urinalysis and diagnosed with a UTI.  She was noted to be clinically dehydrated.  Her urine drug screen testing was negative.  Her chest x-ray did not reveal any acute abnormalities.  EKG revealed sinus bradycardia.  Patient was started on IV fluids and hospital admission was requested.   Review of Systems: All systems reviewed and apart from history of presenting  illness, are negative.  Past Medical History:  Diagnosis Date  . Arthritis   . CAD (coronary artery disease)    Multivessel status post CABG 2001  . Chronic obstructive pulmonary disease (HCC)   . DDD (degenerative disc disease), lumbar   . Diastolic CHF (HCC)   . Essential hypertension   . Hematuria   . History of CVA (cerebrovascular accident)    2013 - lacunar infarct right thalamus - residual left side of  lip numb  and left eye vision worse (which corrented lens implant from cataract extraction)  . History of MI (myocardial infarction)    July 2001  . Hyperlipidemia   . Hypothyroidism   . Insulin dependent type 2 diabetes mellitus (HCC)   . Lesion of bladder   . Lower urinary tract symptoms (LUTS)   . OSA (obstructive sleep apnea)   . Peripheral neuropathy   . Psoriasis   . SUI (stress urinary incontinence, female)   . Varicose veins    Past Surgical History:  Procedure Laterality Date  . APPENDECTOMY  age 60  . CARDIAC CATHETERIZATION  1991   No sig. cad  . CARDIAC CATHETERIZATION  07-01-2000   high grade in-stent restenosis  . CARDIAC CATHETERIZATION  04-30-2001   single native vessel cad/ graft patent  . CARDIAC CATHETERIZATION  02-13-2006  dr [place] cad with total occluded LAD  and  70% ostial of small ramus/  grafts patent/  normal ef  .  CARDIAC CATHETERIZATION  01-20-2000  dr Clifton James   essentially unchanged;  severe single vessel cad with diffuse disease throughout CFX and RCA/ ef 66%;  chf with preserved LVSF  . CARDIOVASCULAR STRESS TEST  08-28-2011   DR ROTHBART   NEGATIVE NUCLEAR STUDY/  GLOBAL LVSF/  EF 65%  . CARPAL TUNNEL RELEASE Bilateral yrs ago  . CATARACT EXTRACTION W/ INTRAOCULAR LENS  IMPLANT, BILATERAL  2013  . CHOLECYSTECTOMY  1990  . COLONOSCOPY N/A 07/26/2015   Procedure: COLONOSCOPY;  Surgeon: Malissa Hippo, MD;  Location: [facility];  Service: Endoscopy;  Laterality: N/A;  1200  . CORONARY ANGIOPLASTY  11-13-1999   balloon  angioplasty to mLAD , d2 of LAD  . CORONARY ANGIOPLASTY WITH STENT PLACEMENT  01-17-2000   PCI stenting to mLAD & D2 of LAD  . CORONARY ARTERY BYPASS GRAFT  07-02-2000   DR Tressie Stalker   SVG to diagonal, LIMA to LAD  . CYSTOSCOPY W/ RETROGRADES Bilateral 08/09/2014   Procedure: CYSTOSCOPY, BILATERAL RETROGRADE, HYDRODISTENSION, BLADDER BIOPSY WITH FULGERATION, INSTILL PYRIDIUM AND MARCAINE ;  Surgeon: Anner Crete, MD;  Location: [facility];  Service: Urology;  Laterality: Bilateral;  . KNEE ARTHROSCOPY Left 10-18-2003   SYNOVECTOMY  . LUMBAR SPINE SURGERY  x2    yrs ago  . ORIF HUMERUS FRACTURE Right 04-18-2008  . REPAIR FLEXOR TENDON HAND Right 09-20-2008   CARPI RADIALIS TENDON TRANSFER TO EXTENSOR TO INDEX, MIDDLE, RING , LITTLE FINGERS AND DIGITI MININI  . ROTATOR CUFF REPAIR Right 1995  . TOTAL ABDOMINAL HYSTERECTOMY W/ BILATERAL SALPINGOOPHORECTOMY  age 73  . TRANSTHORACIC ECHOCARDIOGRAM  12-12-2013   MILD LVH/  EF 60-65%/  GRADE I DIASTOLIC DYSFUNCTION/  MILD LAE   Social History:  reports that she has never smoked. She has never used smokeless tobacco. She reports that she does not drink alcohol or use drugs.  Allergies  Allergen Reactions  . Codeine Itching    Family History  Problem Relation Age of Onset  . Hypertension Mother   . Stroke Mother   . Coronary artery disease Unknown        Multiple first and second-degree relatives  . Aneurysm Unknown        Cerebral circulation  . Colon cancer Sister   . Stroke Sister   . Coronary artery disease Sister   . Clotting disorder Unknown        Children diagnosed with hypercoagulable state  . Coronary artery disease Brother     Prior to Admission medications   Medication Sig Start Date End Date Taking? Authorizing Provider  amLODipine (NORVASC) 5 MG tablet Take 5 mg by mouth daily. 07/03/17  Yes [provider]  aspirin EC 325 MG tablet Take 325 mg by mouth daily.   Yes [provider]    gabapentin (NEURONTIN) 300 MG capsule Take 300 mg by mouth 2 (two) times daily. 09/18/17  Yes [provider]  insulin NPH-regular Human (NOVOLIN 70/30) (70-30) 100 UNIT/ML injection Inject 40 Units into the skin 2 (two) times daily.   Yes [provider]  levothyroxine (SYNTHROID, LEVOTHROID) 25 MCG tablet Take 25 mcg by mouth daily.    Yes [provider]  lisinopril (PRINIVIL,ZESTRIL) 10 MG tablet Take 10 mg by mouth daily.   Yes [provider]  metFORMIN (GLUCOPHAGE) 500 MG tablet Take 500 mg by mouth 2 (two) times daily with a meal.     Yes [provider]  naproxen (NAPROSYN) 500 MG tablet  Take 1 tablet by mouth 2 (two) times daily. 05/25/18  Yes [provider]  Oxycodone HCl 10 MG TABS Take 5 mg by mouth every 4 (four) hours.  02/20/18  Yes [provider]  triamcinolone cream (KENALOG) 0.1 % APPLY TO AFFECTED AREA TWICE A DAY AS DIRECTED FOR FLARES 05/04/18  Yes [provider]  ipratropium-albuterol (DUONEB) 0.5-2.5 (3) MG/3ML SOLN Inhale 3 mLs into the lungs 4 (four) times daily as needed. 09/19/17   [provider]  nitroGLYCERIN (NITROSTAT) 0.4 MG SL tablet Place 0.4 mg under the tongue every 5 (five) minutes as needed for chest pain.     [provider]   Physical Exam: Vitals:   06/10/18 1500 06/10/18 1526 06/10/18 1529 06/10/18 1532  BP: (!) 161/81 (!) 164/73 (!) 160/72 (!) 145/77  Pulse: (!) 54 (!) 52 (!) 55 (!) 57  Resp: 17 20  20   Temp:  98.9 F (37.2 C)    TempSrc:      SpO2: 98% 100%  94%  Weight:      Height:         General exam: Moderately built and nourished patient, lying comfortably supine on the gurney in no obvious distress.  Patient has some lethargic but is very easily arousable.  Head, eyes and ENT: Nontraumatic and normocephalic. Pupils equally reacting to light and accommodation. Oral mucosa dry.  Neck: Supple. No JVD, carotid bruit or thyromegaly.  Lymphatics: No  lymphadenopathy.  Respiratory system: Clear to auscultation. No increased work of breathing.  Cardiovascular system: S1 and S2 heard, bradycardic. No JVD, murmurs, gallops, clicks.  Gastrointestinal system: Abdomen is nondistended, soft and nontender. Normal bowel sounds heard. No organomegaly or masses appreciated.  Central nervous system:  No focal neurological deficits.  Extremities: Symmetric 5 x 5 power. Peripheral pulses symmetrically felt.   Skin: No rashes or acute findings.  Musculoskeletal system: Negative exam.  Psychiatry: Pleasant and cooperative.  Labs on Admission:  Basic Metabolic Panel: Recent Labs  Lab 06/10/18 1122  NA 139  K 4.4  CL 104  CO2 28  GLUCOSE 278*  BUN 17  CREATININE 0.85  CALCIUM 9.2   Liver Function Tests: Recent Labs  Lab 06/10/18 1136  AST 17  ALT 13*  ALKPHOS 79  BILITOT 0.5  PROT 6.1*  ALBUMIN 3.5   No results for input(s): LIPASE, AMYLASE in the last 168 hours. No results for input(s): AMMONIA in the last 168 hours. CBC: Recent Labs  Lab 06/10/18 1122  WBC 8.2  NEUTROABS 4.6  HGB 12.6  HCT 41.6  MCV 86.8  PLT 252   Cardiac Enzymes: Recent Labs  Lab 06/10/18 1122 06/10/18 1545  TROPONINI <0.03 <0.03    BNP (last 3 results) No results for input(s): PROBNP in the last 8760 hours. CBG: Recent Labs  Lab 06/10/18 1118  GLUCAP 271*    Radiological Exams on Admission: Dg Chest 2 View  Result Date: 06/10/2018 CLINICAL DATA:  Weakness. EXAM: CHEST - 2 VIEW COMPARISON:  Radiographs of February 28, 2018. FINDINGS: The heart size and mediastinal contours are within normal limits. Status post coronary artery bypass graft. No pneumothorax or pleural effusion is noted. Both lungs are clear. The visualized skeletal structures are unremarkable. IMPRESSION: No active cardiopulmonary disease. Electronically Signed   By: Lupita Raider, M.D.   On: 06/10/2018 12:29   Ct Head Wo Contrast  Result Date: 06/10/2018 CLINICAL  DATA:  Altered level of consciousness. EXAM: CT HEAD WITHOUT CONTRAST TECHNIQUE: Contiguous axial  images were obtained from the base of the skull through the vertex without intravenous contrast. COMPARISON:  CT scan of May 10, 2017. FINDINGS: Brain: Mild chronic ischemic white matter disease is noted. Mild diffuse cortical atrophy is noted. No mass effect or midline shift is noted. Ventricular size is within normal limits. There is no evidence of mass lesion, hemorrhage or acute infarction. Vascular: No hyperdense vessel or unexpected calcification. Skull: Normal. Negative for fracture or focal lesion. Sinuses/Orbits: No acute finding. Other: None. IMPRESSION: Mild chronic ischemic white matter disease. Mild diffuse cortical atrophy. No acute intracranial abnormality seen. Electronically Signed   By: Lupita RaiderJames  Green Jr, M.D.   On: 06/10/2018 12:35    EKG: Personally reviewed.  Sinus bradycardia  Assessment/Plan Principal Problem:   Acute lower UTI Active Problems:   Hypothyroidism   DM type 2 causing complication (HCC)   Hyperlipidemia   Essential hypertension   Sleep apnea   Chronic back pain   Chronic obstructive pulmonary disease (HCC)   Weakness generalized   OSA (obstructive sleep apnea)   CAD (coronary artery disease)   Diastolic dysfunction   Loss of consciousness (HCC)   Hyperglycemia   Bradycardia, sinus  1. Acute loss of consciousness/syncopal episodes-the patient did have 2 sequential episodes of syncope where there was full loss of consciousness.  The patient has not ever had an episode like this before.  She was noted to have these episodes witnessed by her children who live with her.  The patient is going to be admitted for a full syncope work-up.  In addition, her therapy has been ordered with IV fluid hydration.  Orthostatic vital signs have been requested.  Telemetry monitoring has been ordered.  Echocardiogram, carotid dopplers pending.  Pt eval requested.  EEG ordered.   Consider cardio/neurology consult if no clear cause determined.  2. Sinus bradycardia-patient is not taking AV nodal blocking agents at this time, monitor on telemetry, follow blood pressure and vitals closely. 3. Acute UTI- urine culture pending, blood cultures pending, continue IV ceftriaxone pending culture findings.  Consider adding fluconazole to cover for yeast given severe glucosuria.  4. Coronary artery disease status post CABG- cycle troponin to rule out acute myocardial ischemia that could be associated with these syncopal episodes.  Telemetry monitoring ordered. 5. COPD- currently stable and asymptomatic, resume home medications and follow clinically. 6. Hypothyroidism- check a TSH to rule out this as a cause of her bradycardia and syncope. 7. Chronic diastolic heart dysfunction- currently compensated, resume home medications and follow.  Echocardiogram requested for the syncopal episode work-up. 8. OSA - Pt says that she is not currently using her CPAP, we will try to get her to use in hospital and it will be offered.  9. Type 2 diabetes mellitus with vascular complications, poorly controlled- check a hemoglobin A1c, resume home 70/30 insulin plus sliding scale coverage and follow. 10. Hyperlipidemia-check a lipid panel. 11. Generalized weakness - Requesting PT evaluation.      DVT Prophylaxis: enoxaparin Code Status: Full   Family Communication: patient   Disposition Plan: inpatient medical treatment and workup  Pt's PCP Dr. Janna Archondiego will take over care of patient in AM.     Time spent: 62 minutes  Standley Dakinslanford Johnson, MD [facility] Pager [phone]  If 7PM-7AM, please contact night-coverage [email or web address] Password [facility] [email or web address] 06/10/2018, 4:26 PM

## 2018-06-10 NOTE — ED Triage Notes (Signed)
Pt states she fell and loss consciousness this morning at 0930.  Unsure if she hit her head.

## 2018-06-11 ENCOUNTER — Inpatient Hospital Stay (HOSPITAL_COMMUNITY)
Admit: 2018-06-11 | Discharge: 2018-06-11 | Disposition: A | Discharge status: Home / Self Care | Payer: Medicare HMO | Attending: Family Medicine | Admitting: Family Medicine

## 2018-06-11 ENCOUNTER — Inpatient Hospital Stay (HOSPITAL_COMMUNITY): Payer: Medicare HMO

## 2018-06-11 DIAGNOSIS — R072 Precordial pain: Secondary | ICD-10-CM

## 2018-06-11 DIAGNOSIS — I25708 Atherosclerosis of coronary artery bypass graft(s), unspecified, with other forms of angina pectoris: Secondary | ICD-10-CM

## 2018-06-11 DIAGNOSIS — R001 Bradycardia, unspecified: Secondary | ICD-10-CM

## 2018-06-11 DIAGNOSIS — I10 Essential (primary) hypertension: Secondary | ICD-10-CM

## 2018-06-11 DIAGNOSIS — R4020 Unspecified coma: Secondary | ICD-10-CM

## 2018-06-11 DIAGNOSIS — R55 Syncope and collapse: Secondary | ICD-10-CM

## 2018-06-11 DIAGNOSIS — E785 Hyperlipidemia, unspecified: Secondary | ICD-10-CM

## 2018-06-11 DIAGNOSIS — I5189 Other ill-defined heart diseases: Secondary | ICD-10-CM

## 2018-06-11 DIAGNOSIS — N390 Urinary tract infection, site not specified: Principal | ICD-10-CM

## 2018-06-11 LAB — COMPREHENSIVE METABOLIC PANEL
ALBUMIN: 3.1 g/dL — AB (ref 3.5–5.0)
ALK PHOS: 67 U/L (ref 38–126)
ALT: 12 U/L — ABNORMAL LOW (ref 14–54)
AST: 14 U/L — AB (ref 15–41)
Anion gap: 4 — ABNORMAL LOW (ref 5–15)
BILIRUBIN TOTAL: 0.4 mg/dL (ref 0.3–1.2)
BUN: 14 mg/dL (ref 6–20)
CALCIUM: 8.5 mg/dL — AB (ref 8.9–10.3)
CO2: 29 mmol/L (ref 22–32)
Chloride: 106 mmol/L (ref 101–111)
Creatinine, Ser: 0.72 mg/dL (ref 0.44–1.00)
GFR calc Af Amer: >60 mL/min (ref >60)
GFR calc non Af Amer: >60 mL/min (ref >60)
GLUCOSE: 141 mg/dL — AB (ref 65–99)
POTASSIUM: 4.2 mmol/L (ref 3.5–5.1)
Sodium: 139 mmol/L (ref 135–145)
TOTAL PROTEIN: 5.9 g/dL — AB (ref 6.5–8.1)

## 2018-06-11 LAB — CBC WITH DIFFERENTIAL/PLATELET
Basophils Absolute: 0 10*3/uL (ref 0.0–0.1)
Basophils Relative: 0 %
EOS PCT: 6 %
Eosinophils Absolute: 0.6 10*3/uL (ref 0.0–0.7)
HCT: 40.4 % (ref 36.0–46.0)
Hemoglobin: 12.2 g/dL (ref 12.0–15.0)
LYMPHS PCT: 40 %
Lymphs Abs: 3.5 10*3/uL (ref 0.7–4.0)
MCH: 26.7 pg (ref 26.0–34.0)
MCHC: 30.2 g/dL (ref 30.0–36.0)
MCV: 88.4 fL (ref 78.0–100.0)
MONO ABS: 0.9 10*3/uL (ref 0.1–1.0)
MONOS PCT: 10 %
Neutro Abs: 3.9 10*3/uL (ref 1.7–7.7)
Neutrophils Relative %: 44 %
PLATELETS: 232 10*3/uL (ref 150–400)
RBC: 4.57 MIL/uL (ref 3.87–5.11)
RDW: 14.2 % (ref 11.5–15.5)
WBC: 8.9 10*3/uL (ref 4.0–10.5)

## 2018-06-11 LAB — VITAMIN D 25 HYDROXY (VIT D DEFICIENCY, FRACTURES): VIT D 25 HYDROXY: 27.7 ng/mL — AB (ref 30.0–100.0)

## 2018-06-11 LAB — HEMOGLOBIN A1C
HEMOGLOBIN A1C: 7.7 % — AB (ref 4.8–5.6)
Mean Plasma Glucose: 174.29 mg/dL

## 2018-06-11 LAB — LIPID PANEL
Cholesterol: 196 mg/dL (ref 0–200)
HDL: 29 mg/dL — AB (ref >40)
LDL CALC: 113 mg/dL — AB (ref 0–99)
Total CHOL/HDL Ratio: 6.8 RATIO
Triglycerides: 272 mg/dL — ABNORMAL HIGH (ref <150)
VLDL: 54 mg/dL — ABNORMAL HIGH (ref 0–40)

## 2018-06-11 LAB — ECHOCARDIOGRAM COMPLETE
Height: 64 in
Weight: 2880 oz

## 2018-06-11 LAB — GLUCOSE, CAPILLARY
GLUCOSE-CAPILLARY: 138 mg/dL — AB (ref 65–99)
GLUCOSE-CAPILLARY: 99 mg/dL (ref 65–99)
GLUCOSE-CAPILLARY: 129 mg/dL — AB (ref 65–99)
GLUCOSE-CAPILLARY: 172 mg/dL — AB (ref 65–99)

## 2018-06-11 LAB — MAGNESIUM: MAGNESIUM: 1.9 mg/dL (ref 1.7–2.4)

## 2018-06-11 LAB — TROPONIN I: Troponin I: <0.03 ng/mL (ref <0.03)

## 2018-06-11 MED ORDER — MORPHINE SULFATE (PF) 2 MG/ML IV SOLN
1.0000 mg | Freq: Once | INTRAVENOUS | Status: AC
  Administered 2018-06-11: 1 mg via INTRAVENOUS
  Filled 2018-06-11: qty 1

## 2018-06-11 NOTE — Progress Notes (Signed)
*  PRELIMINARY RESULTS* Echocardiogram 2D Echocardiogram has been performed.  Stacey DrainWhite, Loyalty Arentz J 06/11/2018, 12:26 PM

## 2018-06-11 NOTE — Evaluation (Signed)
Physical Therapy Evaluation Patient Details Name: [name] [name] MRN: [id number] DOB: [birth year] Today's Date: 06/11/2018   History of Present Illness  [name] [name] is a 82 y.o. female with a complex past medical history as detailed below but significant for coronary artery disease, COPD, hypertension, diabetes mellitus and hypothyroidism who presented to the emergency department after she apparently had an episode of syncope where she completely lost consciousness twice this morning when she was getting up from her bedroom.  She says that she had no symptoms of chest pain or shortness of breath and had been feeling fairly well with the exception of being sleepy.  The patient says that she does not remember the event but she does remember waking up with her children yelling at her because she had passed out.  She says that the episode happened a second time shortly after that.  She arrived at the emergency department and she was evaluated.  She was found to be bradycardic and she was noted to have normal blood pressure.  She says that she has no history of seizures.  She says that she does not have a history of syncope.  She says that she had never passed out before.     Clinical Impression  Patient presents with left upper eyelid droop, but can occasional keep left eye open (RN aware), demonstrates labored movement for sitting up and very unsteady on feet with near fall when attempting gait with quad-cane, required use of RW.  Patient limited for gait due to poor balance, fatigue and generalized weakness.  Patient will benefit from continued physical therapy in hospital and recommended venue below to increase strength, balance, endurance for safe ADLs and gait.    Follow Up Recommendations SNF;Supervision/Assistance - 24 hour    Equipment Recommendations  None recommended by PT    Recommendations for Other Services       Precautions / Restrictions Precautions Precautions:  Fall Restrictions Weight Bearing Restrictions: No      Mobility  Bed Mobility Overal bed mobility: Needs Assistance Bed Mobility: Supine to Sit     Supine to sit: Min assist     General bed mobility comments: slow labored   Transfers Overall transfer level: Needs assistance Equipment used: Quad cane;Rolling walker (2 wheeled) Transfers: Sit to/from [employer] CorporationStand;Stand Pivot Transfers Sit to Stand: Min assist Stand pivot transfers: Min assist;Mod assist       General transfer comment: very unsteady with near loss of balance using quad-cane, more stable/safer using RW  Ambulation/Gait Ambulation/Gait assistance: Min assist;Mod assist Gait Distance (Feet): 25 Feet Assistive device: Rolling walker (2 wheeled) Gait Pattern/deviations: Decreased step length - right;Decreased step length - left;Decreased stride length Gait velocity: slow   General Gait Details: slow labored unsteady cadence with frequent stopping due to fatigue and VC's to step closer to [employer]W  Stairs            Wheelchair Mobility    Modified Rankin (Stroke Patients Only)       Balance Overall balance assessment: Needs assistance Sitting-balance support: Feet supported;No upper extremity supported Sitting balance-Leahy Scale: Fair Sitting balance - Comments: fair/good   Standing balance support: Single extremity supported;During functional activity Standing balance-Leahy Scale: Poor Standing balance comment: fair with RW                             Pertinent Vitals/Pain Pain Assessment: 0-10 Pain Score: 8  Pain Location: chronic low back Pain Descriptors /  Indicators: Aching Pain Intervention(s): Limited activity within patient's tolerance;Monitored during session    Home Living Family/patient expects to be discharged to:: Private residence Living Arrangements: Spouse/significant other Available Help at Discharge: Family;Available 24 hours/day Type of Home: House Home Access:  Stairs to enter Entrance Stairs-Rails: None Entrance Stairs-Number of Steps: 1 Home Layout: One level Home Equipment: Walker - 4 wheels;Shower seat      Prior Function Level of Independence: Independent;Independent with assistive device(s)         Comments: community ambulator with SPC, drives     Hand Dominance   Dominant Hand: Right    Extremity/Trunk Assessment   Upper Extremity Assessment Upper Extremity Assessment: Generalized weakness    Lower Extremity Assessment Lower Extremity Assessment: Generalized weakness    Cervical / Trunk Assessment Cervical / Trunk Assessment: Normal  Communication   Communication: No difficulties  Cognition Arousal/Alertness: Awake/alert Behavior During Therapy: WFL for tasks assessed/performed Overall Cognitive Status: Within Functional Limits for tasks assessed                                        General Comments      Exercises     Assessment/Plan    PT Assessment Patient needs continued PT services  PT Problem List Decreased strength;Decreased activity tolerance;Decreased balance;Decreased mobility       PT Treatment Interventions Gait training;Stair training;Functional mobility training;Therapeutic activities;Patient/family education    PT Goals (Current goals can be found in the Care Plan section)  Acute Rehab PT Goals Patient Stated Goal: return home Time For Goal Achievement: 06/25/18 Potential to Achieve Goals: Good    Frequency Min 3X/week   Barriers to discharge        Co-evaluation               AM-PAC PT "6 Clicks" Daily Activity  Outcome Measure Difficulty turning over in bed (including adjusting bedclothes, sheets and blankets)?: A Little Difficulty moving from lying on back to sitting on the side of the bed? : A Little Difficulty sitting down on and standing up from a chair with arms (e.g., wheelchair, bedside commode, etc,.)?: A Little Help needed moving to and from a  bed to chair (including a wheelchair)?: A Little Help needed walking in hospital room?: A Lot Help needed climbing 3-5 steps with a railing? : A Lot 6 Click Score: 16    End of Session   Activity Tolerance: Patient tolerated treatment well;Patient limited by fatigue Patient left: in chair;with chair alarm set;with call bell/phone within reach Nurse Communication: Mobility status;Other (comment)(RN notified that patient left in chair) PT Visit Diagnosis: Unsteadiness on feet (R26.81);Other abnormalities of gait and mobility (R26.89);Muscle weakness (generalized) (M62.81)    Time: 1610-9604 PT Time Calculation (min) (ACUTE ONLY): 33 min   Charges:   PT Evaluation $PT Eval Moderate Complexity: 1 Mod PT Treatments $Therapeutic Activity: 23-37 mins   PT G Codes:        2:31 PM, 2018/07/04 Ocie Bob, MPT Physical Therapist with [facility] [phone] office [phone] mobile phone

## 2018-06-11 NOTE — Plan of Care (Signed)
  Problem: Acute Rehab PT Goals(only PT should resolve) Goal: Pt Will Go Supine/Side To Sit Outcome: Progressing Flowsheets (Taken 06/11/2018 1432) Pt will go Supine/Side to Sit: with supervision Goal: Patient Will Transfer Sit To/From Stand Outcome: Progressing Flowsheets (Taken 06/11/2018 1432) Patient will transfer sit to/from stand: with min guard assist Goal: Pt Will Transfer Bed To Chair/Chair To Bed Outcome: Progressing Flowsheets (Taken 06/11/2018 1432) Pt will Transfer Bed to Chair/Chair to Bed: min guard assist Goal: Pt Will Ambulate Outcome: Progressing Flowsheets (Taken 06/11/2018 1432) Pt will Ambulate: 50 feet;with min guard assist;with rolling walker   2:33 PM, 06/11/18 Ocie BobJames Dailey Buccheri, MPT Physical Therapist [facility] [phone] office [phone] mobile phone

## 2018-06-11 NOTE — [place] FL2 (Signed)
[place] MEDICAID FL2 LEVEL OF CARE SCREENING TOOL     IDENTIFICATION  Patient Name: [name] AuerRuby J Calabrese Birthdate: 1936-01-04 Sex: female Admission Date (Current Location): 06/10/2018  [facility] [place]   Facility and Address:  [facility],  [address], [place] [id number]      Provider Number: [id number]  Attending Physician Name and Address:  Oval Linseyondiego, Richard, MD  Relative Name and Phone Number:  Vladimir CroftsMichael Patterson , Son  [phone]    Current Level of Care: Hospital Recommended Level of Care: Skilled Nursing Facility Prior Approval Number: [id number] [phone]  Date Approved/Denied: 03/20/16 PASRR Number:    Discharge Plan: SNF    Current Diagnoses: Patient Active Problem List   Diagnosis Date Noted  . Loss of consciousness (HCC) 06/10/2018  . Hyperglycemia 06/10/2018  . Bradycardia, sinus 06/10/2018  . Diastolic dysfunction 09/29/2017  . CAD (coronary artery disease) 09/28/2017  . Acute lower UTI 09/28/2017  . Ischemic stroke (HCC) 05/10/2017  . Acute ischemic stroke (HCC) 05/10/2017  . Stroke (HCC) 08/01/2016  . Dysarthria 03/18/2016  . Anemia of chronic disease 11/19/2015  . OSA (obstructive sleep apnea) 11/19/2015  . Left facial numbness 08/29/2015  . Gait instability 06/26/2015  . COPD exacerbation (HCC) 11/13/2014  . Weakness generalized 12/10/2013  . TIA (transient ischemic attack) 06/22/2012  . Left-sided headache 06/22/2012  . Noncompliance 06/22/2012  . Noncompliance with CPAP treatment 06/22/2012  . Psoriasis 01/07/2012  . Cerebrovascular disease   . Chronic back pain   . Chronic obstructive pulmonary disease (HCC)   . DM type 2 causing complication (HCC) 02/14/2010  . Obesity 02/14/2010  . Arteriosclerotic cardiovascular disease (ASCVD) 02/14/2010  . Hypothyroidism 02/06/2010  . Hyperlipidemia 02/06/2010  . Essential hypertension 02/06/2010  . Sleep apnea 02/06/2010    Orientation RESPIRATION BLADDER Height  & Weight     Self, Place  O2(see dc summary) Continent Weight: 180 lb (81.6 kg) Height:  5\' 4"  (162.6 cm)  BEHAVIORAL SYMPTOMS/MOOD NEUROLOGICAL BOWEL NUTRITION STATUS      Continent Diet(see dc summary)  AMBULATORY STATUS COMMUNICATION OF NEEDS Skin   Extensive Assist Verbally Normal                       Personal Care Assistance Level of Assistance  Bathing, Feeding, Dressing Bathing Assistance: Limited assistance Feeding assistance: Independent Dressing Assistance: Limited assistance     Functional Limitations Info  Sight, Hearing, Speech Sight Info: Adequate Hearing Info: Adequate Speech Info: Adequate    SPECIAL CARE FACTORS FREQUENCY  PT (By licensed PT)     PT Frequency: 5 times week              Contractures Contractures Info: Not present    Additional Factors Info  Code Status, Allergies Code Status Info: full Allergies Info: codeine           Current Medications (06/11/2018):  This is the current hospital active medication list Current Facility-Administered Medications  Medication Dose Route Frequency Provider Last Rate Last Dose  . 0.9 %  sodium chloride infusion   Intravenous Continuous Johnson, Clanford L, MD 50 mL/hr at 06/10/18 1545    . acetaminophen (TYLENOL) tablet 650 mg  650 mg Oral Q6H PRN Johnson, Clanford L, MD       Or  . acetaminophen (TYLENOL) suppository 650 mg  650 mg Rectal Q6H PRN Johnson, Clanford L, MD      . amLODipine (NORVASC) tablet 5 mg  5 mg Oral Daily Laural BenesJohnson,  Clanford L, MD   5 mg at 06/11/18 0845  . aspirin EC tablet 325 mg  325 mg Oral Daily Johnson, Clanford L, MD   325 mg at 06/11/18 0845  . cefTRIAXone (ROCEPHIN) 1 g in sodium chloride 0.9 % 100 mL IVPB  1 g Intravenous Q24H Johnson, Clanford L, MD      . enoxaparin (LOVENOX) injection 40 mg  40 mg Subcutaneous Q24H Johnson, Clanford L, MD   40 mg at 06/10/18 2039  . gabapentin (NEURONTIN) capsule 300 mg  300 mg Oral BID Johnson, Clanford L, MD   300 mg at  06/11/18 0845  . hydrALAZINE (APRESOLINE) injection 10 mg  10 mg Intravenous Q4H PRN Johnson, Clanford L, MD      . insulin aspart (novoLOG) injection 0-15 Units  0-15 Units Subcutaneous TID WC Laural Benes, Clanford L, MD   2 Units at 06/11/18 0843  . insulin aspart protamine- aspart (NOVOLOG MIX 70/30) injection 40 Units  40 Units Subcutaneous BID WC Johnson, Clanford L, MD   40 Units at 06/11/18 0843  . ipratropium-albuterol (DUONEB) 0.5-2.5 (3) MG/3ML nebulizer solution 3 mL  3 mL Nebulization QID PRN Johnson, Clanford L, MD      . levothyroxine (SYNTHROID, LEVOTHROID) tablet 25 mcg  25 mcg Oral QAC breakfast Laural Benes, Clanford L, MD   25 mcg at 06/11/18 0640  . lisinopril (PRINIVIL,ZESTRIL) tablet 10 mg  10 mg Oral Daily Johnson, Clanford L, MD   10 mg at 06/11/18 0845  . nitroGLYCERIN (NITROSTAT) SL tablet 0.4 mg  0.4 mg Sublingual Q5 min PRN Johnson, Clanford L, MD      . ondansetron (ZOFRAN) tablet 4 mg  4 mg Oral Q6H PRN Johnson, Clanford L, MD       Or  . ondansetron (ZOFRAN) injection 4 mg  4 mg Intravenous Q6H PRN Johnson, Clanford L, MD      . oxyCODONE (Oxy IR/ROXICODONE) immediate release tablet 5 mg  5 mg Oral Q4H PRN Johnson, Clanford L, MD   5 mg at 06/11/18 0640  . senna-docusate (Senokot-S) tablet 1 tablet  1 tablet Oral QHS Laural Benes, Clanford L, MD   1 tablet at 06/10/18 2038     Discharge Medications: Please see discharge summary for a list of discharge medications.  Relevant Imaging Results:  Relevant Lab Results:   Additional Information SSN: [id number] [address], LCSW

## 2018-06-11 NOTE — Progress Notes (Addendum)
Patient with known CAD multiple stenting diastolic dysfunction normal systolic function had a true syncopal episode at home her husband went to nursing center 1 week ago she has no recall of event woke up on the floor she was seen in ER found to be sinus bradycardic around 60 bpm no AV nodal  blocking agents and since being admitted in hospital has had a 2D echo and carotid ultrasounds the results of which are not available at the time of this discharge currently in sinus bradycardia sinus rhythm 60 bpm she denied any antecedent anginal or anginal equivalent symptomatology [name] [name] ZOX:[id number] DOB: 1936/05/21 DOA: 06/10/2018 PCP: [name] Linsey, MD   Physical Exam: Blood pressure (!) 149/63, pulse (!) 55, temperature 98 F (36.7 C), temperature source Oral, resp. rate 20, height 5\' 4"  (1.626 m), weight 81.6 kg (180 lb), SpO2 98 %.  Lungs clear to A&P no rales rhonchi heart regular rhythm no S3-S4 no heaves thrills rubs no carotid bruits auscultated neurologically no gross focal motor deficits noted   Investigations:  No results found for this or any previous visit (from the past 240 hour(s)).   Basic Metabolic Panel: Recent Labs    06/10/18 1122 06/11/18 0328  NA 139 139  K 4.4 4.2  CL 104 106  CO2 28 29  GLUCOSE 278* 141*  BUN 17 14  CREATININE 0.85 0.72  CALCIUM 9.2 8.5*   Liver Function Tests: Recent Labs    06/10/18 1136 06/11/18 0328  AST 17 14*  ALT 13* 12*  ALKPHOS 79 67  BILITOT 0.5 0.4  PROT 6.1* 5.9*  ALBUMIN 3.5 3.1*     CBC: Recent Labs    06/10/18 1122 06/11/18 0328  WBC 8.2 8.9  NEUTROABS 4.6 3.9  HGB 12.6 12.2  HCT 41.6 40.4  MCV 86.8 88.4  PLT 252 232    Dg Chest 2 View  Result Date: 06/10/2018 CLINICAL DATA:  Weakness. EXAM: CHEST - 2 VIEW COMPARISON:  Radiographs of February 28, 2018. FINDINGS: The heart size and mediastinal contours are within normal limits. Status post coronary artery bypass graft. No pneumothorax or pleural effusion  is noted. Both lungs are clear. The visualized skeletal structures are unremarkable. IMPRESSION: No active cardiopulmonary disease. Electronically Signed   By: Lupita Raider, M.D.   On: 06/10/2018 12:29   Ct Head Wo Contrast  Result Date: 06/10/2018 CLINICAL DATA:  Altered level of consciousness. EXAM: CT HEAD WITHOUT CONTRAST TECHNIQUE: Contiguous axial images were obtained from the base of the skull through the vertex without intravenous contrast. COMPARISON:  CT scan of May 10, 2017. FINDINGS: Brain: Mild chronic ischemic white matter disease is noted. Mild diffuse cortical atrophy is noted. No mass effect or midline shift is noted. Ventricular size is within normal limits. There is no evidence of mass lesion, hemorrhage or acute infarction. Vascular: No hyperdense vessel or unexpected calcification. Skull: Normal. Negative for fracture or focal lesion. Sinuses/Orbits: No acute finding. Other: None. IMPRESSION: Mild chronic ischemic white matter disease. Mild diffuse cortical atrophy. No acute intracranial abnormality seen. Electronically Signed   By: Lupita Raider, M.D.   On: 06/10/2018 12:35      Medications:  Impression:  Principal Problem:   Acute lower UTI Active Problems:   Hypothyroidism   DM type 2 causing complication (HCC)   Hyperlipidemia   Essential hypertension   Sleep apnea   Chronic back pain   Chronic obstructive pulmonary disease (HCC)   Weakness generalized   OSA (obstructive sleep  apnea)   CAD (coronary artery disease)   Diastolic dysfunction   Loss of consciousness (HCC)   Hyperglycemia   Bradycardia, sinus     Plan: Await results of carotid ultrasound as well as 2D echo will obtain cardiology consult for sinus rate slower than anticipated and whether or not any ischemic event may be mediated dysrhythmic syncope.  Continue insulin as ordered patient appears hemodynamically stable has UTI currently on antibiotics.  Obtain serum magnesium level  today  Consultants: Cardiology consulted today   Procedures   Antibiotics: Rocephin 1 g IV every 24 hours          Time spent: 30 minutes   LOS: 1 day   [name] M   06/11/2018, 12:27 PM

## 2018-06-11 NOTE — Consult Note (Addendum)
Cardiology Consult    Patient ID: [name] [name]; [id number]; 1936/12/01   Admit date: 06/10/2018 Date of Consult: 06/11/2018  Primary Care Provider: Oval Linsey, MD Primary Cardiologist: Previously evaluated by Dr. Diona Browner during admission in 08/2016 --> No outpatient follow-up since  Patient Profile    [name] [name] is a 82 y.o. female with past medical history of CAD (s/p CABG in 2001 with low-risk NST in 2012 and 2017), HTN, HLD, and prior CVA who is being seen today for the evaluation of syncope and bradycardia at the request of Dr. Janna Arch.   History of Present Illness    [name] [name] was last evaluated by the Cardiology service in 08/2016 for epigastric chest discomfort which she described as a sharp, stabbing pain. Cyclic enzymes were negative but given no recent ischemic evaluation, a repeat stress test was pursued. This showed a fixed defect which was thought to be most consistent with soft tissue attenuation and no significant ischemia was noted, overall being a low risk study.  She presented to [facility] ED on 06/10/2018 for evaluation of a syncopal episode which occurred earlier that morning. She reports that she had went to the restroom and urinated and upon standing, she passed out and fell forward, hitting her head. She denies any prodromal symptoms at that time. She stood up and walked to the couch and then had another syncopal event while sitting on the which was witnessed by family. Believes she only lost consciousness for a few seconds. Denies any associated headaches, vision changes, palpitations, chest discomfort, or dyspnea.  She does report having an episode of chest discomfort 3 weeks prior which resolved with the use of sublingual nitroglycerin. She has been active in performing household chores and walking around the grocery store since with no recurrent symptoms.  Initial labs show WBC 8.2, Hgb 12.6, platelets 252, Na+ 139, K+ 4.4, and creatinine  0.85. TSH 2.526. UDS negative. UA consistent with UTI and urine culture shows E. coli. Initial and cyclic troponin values have been obtained and have remained negative. EKG shows sinus bradycardia, heart rate, 58 with LAFB. No acute ST or T wave changes when compared to prior tracings. CXR showed no active cardiopulmonary disease. CT Head showed mild chronic ischemic white matter disease with no acute intracranial abnormalities. Carotid Dopplers and an echocardiogram have been performed with official reports pending.  I cannot see where orthostatic vitals were obtained upon admission and she has been receiving IVF at 50 mL/hr since admission. Has been started on antibiotic therapy for her UTI. Telemetry is reviewed and shows sinus bradycardia with heart rate in the mid 50's to 60's and intermittent episodes of first-degree AV block but no significant pauses or arrhythmias noted.    Past Medical History:  Diagnosis Date  . Arthritis   . CAD (coronary artery disease)    Multivessel status post CABG 2001  . Chronic obstructive pulmonary disease (HCC)   . DDD (degenerative disc disease), lumbar   . Diastolic CHF (HCC)   . Essential hypertension   . Hematuria   . History of CVA (cerebrovascular accident)    2013 - lacunar infarct right thalamus - residual left side of  lip numb  and left eye vision worse (which corrented lens implant from cataract extraction)  . History of MI (myocardial infarction)    July 2001  . Hyperlipidemia   . Hypothyroidism   . Insulin dependent type 2 diabetes mellitus (HCC)   . Lesion of bladder   .  Lower urinary tract symptoms (LUTS)   . OSA (obstructive sleep apnea)   . Peripheral neuropathy   . Psoriasis   . SUI (stress urinary incontinence, female)   . Varicose veins     Past Surgical History:  Procedure Laterality Date  . APPENDECTOMY  age 89  . CARDIAC CATHETERIZATION  1991   No sig. cad  . CARDIAC CATHETERIZATION  07-01-2000   high grade in-stent  restenosis  . CARDIAC CATHETERIZATION  04-30-2001   single native vessel cad/ graft patent  . CARDIAC CATHETERIZATION  02-13-2006  dr Jenness Cornerbrodie   2V cad with total occluded LAD  and  70% ostial of small ramus/  grafts patent/  normal ef  . CARDIAC CATHETERIZATION  01-20-2000  dr Clifton Jamesmcalhany   essentially unchanged;  severe single vessel cad with diffuse disease throughout CFX and RCA/ ef 66%;  chf with preserved LVSF  . CARDIOVASCULAR STRESS TEST  08-28-2011   DR ROTHBART   NEGATIVE NUCLEAR STUDY/  GLOBAL LVSF/  EF 65%  . CARPAL TUNNEL RELEASE Bilateral yrs ago  . CATARACT EXTRACTION W/ INTRAOCULAR LENS  IMPLANT, BILATERAL  2013  . CHOLECYSTECTOMY  1990  . COLONOSCOPY N/A 07/26/2015   Procedure: COLONOSCOPY;  Surgeon: Malissa HippoNajeeb U Rehman, MD;  [facility]: [facility];  Service: Endoscopy;  Laterality: N/A;  1200  . CORONARY ANGIOPLASTY  11-13-1999   balloon angioplasty to mLAD , d2 of LAD  . CORONARY ANGIOPLASTY WITH STENT PLACEMENT  01-17-2000   PCI stenting to mLAD & D2 of LAD  . CORONARY ARTERY BYPASS GRAFT  07-02-2000   DR Tressie StalkerLARENCE OWEN   SVG to diagonal, LIMA to LAD  . CYSTOSCOPY W/ RETROGRADES Bilateral 08/09/2014   Procedure: CYSTOSCOPY, BILATERAL RETROGRADE, HYDRODISTENSION, BLADDER BIOPSY WITH FULGERATION, INSTILL PYRIDIUM AND MARCAINE ;  Surgeon: Anner CreteJohn J Wrenn, MD;  [facility];  Service: Urology;  Laterality: Bilateral;  . KNEE ARTHROSCOPY Left 10-18-2003   SYNOVECTOMY  . LUMBAR SPINE SURGERY  x2    yrs ago  . ORIF HUMERUS FRACTURE Right 04-18-2008  . REPAIR FLEXOR TENDON HAND Right 09-20-2008   CARPI RADIALIS TENDON TRANSFER TO EXTENSOR TO INDEX, MIDDLE, RING , LITTLE FINGERS AND DIGITI MININI  . ROTATOR CUFF REPAIR Right 1995  . TOTAL ABDOMINAL HYSTERECTOMY W/ BILATERAL SALPINGOOPHORECTOMY  age 82  . TRANSTHORACIC ECHOCARDIOGRAM  12-12-2013   MILD LVH/  EF 60-65%/  GRADE I DIASTOLIC DYSFUNCTION/  MILD LAE     Home Medications:  Prior to Admission medications    Medication Sig Start Date End Date Taking? Authorizing Provider  amLODipine (NORVASC) 5 MG tablet Take 5 mg by mouth daily. 07/03/17  Yes [provider]  aspirin EC 325 MG tablet Take 325 mg by mouth daily.   Yes [provider]  gabapentin (NEURONTIN) 300 MG capsule Take 300 mg by mouth 2 (two) times daily. 09/18/17  Yes [provider]  insulin NPH-regular Human (NOVOLIN 70/30) (70-30) 100 UNIT/ML injection Inject 40 Units into the skin 2 (two) times daily.   Yes [provider]  levothyroxine (SYNTHROID, LEVOTHROID) 25 MCG tablet Take 25 mcg by mouth daily.    Yes [provider]  lisinopril (PRINIVIL,ZESTRIL) 10 MG tablet Take 10 mg by mouth daily.   Yes [provider]  metFORMIN (GLUCOPHAGE) 500 MG tablet Take 500 mg by mouth 2 (two) times daily with a meal.     Yes [provider]  naproxen (NAPROSYN) 500 MG tablet Take 1 tablet by mouth 2 (two)  times daily. 05/25/18  Yes [provider]  Oxycodone HCl 10 MG TABS Take 5 mg by mouth every 4 (four) hours.  02/20/18  Yes [provider]  triamcinolone cream (KENALOG) 0.1 % APPLY TO AFFECTED AREA TWICE A DAY AS DIRECTED FOR FLARES 05/04/18  Yes [provider]  ipratropium-albuterol (DUONEB) 0.5-2.5 (3) MG/3ML SOLN Inhale 3 mLs into the lungs 4 (four) times daily as needed. 09/19/17   [provider]  nitroGLYCERIN (NITROSTAT) 0.4 MG SL tablet Place 0.4 mg under the tongue every 5 (five) minutes as needed for chest pain.     [provider]    Inpatient Medications: Scheduled Meds: . amLODipine  5 mg Oral Daily  . aspirin EC  325 mg Oral Daily  . enoxaparin (LOVENOX) injection  40 mg Subcutaneous Q24H  . gabapentin  300 mg Oral BID  . insulin aspart  0-15 Units Subcutaneous TID WC  . insulin aspart protamine- aspart  40 Units Subcutaneous BID WC  . levothyroxine  25 mcg Oral QAC breakfast  . lisinopril  10 mg Oral Daily  .  senna-docusate  1 tablet Oral QHS   Continuous Infusions: . sodium chloride 50 mL/hr at 06/11/18 1259  . cefTRIAXone (ROCEPHIN)  IV Stopped (06/11/18 1640)   PRN Meds: acetaminophen **OR** acetaminophen, hydrALAZINE, ipratropium-albuterol, nitroGLYCERIN, ondansetron **OR** ondansetron (ZOFRAN) IV, oxyCODONE  Allergies:    Allergies  Allergen Reactions  . Codeine Itching    Social History:   Social History   Socioeconomic History  . Marital status: Married    Spouse name: Not on file  . Number of children: 2  . Years of education: Not on file  . Highest education level: Not on file  Occupational History    Employer: RETIRED  Social Needs  . Financial resource strain: Not on file  . Food insecurity:    Worry: Not on file    Inability: Not on file  . Transportation needs:    Medical: Not on file    Non-medical: Not on file  Tobacco Use  . Smoking status: Never Smoker  . Smokeless tobacco: Never Used  Substance and Sexual Activity  . Alcohol use: No  . Drug use: No  . Sexual activity: Never  Lifestyle  . Physical activity:    Days per week: Not on file    Minutes per session: Not on file  . Stress: Not on file  Relationships  . Social connections:    Talks on phone: Not on file    Gets together: Not on file    Attends religious service: Not on file    Active member of club or organization: Not on file    Attends meetings of clubs or organizations: Not on file    Relationship status: Not on file  . Intimate partner violence:    Fear of current or ex partner: Not on file    Emotionally abused: Not on file    Physically abused: Not on file    Forced sexual activity: Not on file  Other Topics Concern  . Not on file  Social History Narrative  . Not on file     Family History:    Family History  Problem Relation Age of Onset  . Hypertension Mother   . Stroke Mother   . Coronary artery disease Unknown        Multiple first and second-degree relatives  .  Aneurysm Unknown        Cerebral circulation  . Colon  cancer Sister   . Stroke Sister   . Coronary artery disease Sister   . Clotting disorder Unknown        Children diagnosed with hypercoagulable state  . Coronary artery disease Brother       Review of Systems    General:  No chills, fever, night sweats or weight changes. Positive for syncope.  Cardiovascular:  No chest pain, dyspnea on exertion, edema, orthopnea, palpitations, paroxysmal nocturnal dyspnea. Dermatological: No rash, lesions/masses Respiratory: No cough, dyspnea Urologic: No hematuria, Positive for dysuria Abdominal:   No nausea, vomiting, diarrhea, bright red blood per rectum, melena, or hematemesis Neurologic:  No visual changes, wkns, changes in mental status. All other systems reviewed and are otherwise negative except as noted above.  Physical Exam/Data    Vitals:   06/10/18 2038 06/10/18 2151 06/11/18 0652 06/11/18 1559  BP:  (!) 128/55 (!) 149/63 (!) 157/61  Pulse:  (!) 57 (!) 55 (!) 56  Resp:    18  Temp:  98.3 F (36.8 C) 98 F (36.7 C)   TempSrc:  Oral Oral   SpO2: 92% 96% 98% 97%  Weight:      Height:        Intake/Output Summary (Last 24 hours) at 06/11/2018 1641 Last data filed at 06/11/2018 1229 Gross per 24 hour  Intake 1042.5 ml  Output 600 ml  Net 442.5 ml   Filed Weights   06/10/18 1110  Weight: 180 lb (81.6 kg)   Body mass index is 30.9 kg/m.   General: Pleasant, elderly Caucasian female appearing in NAD Psych: Normal affect. Neuro: Alert and oriented X 3. Moves all extremities spontaneously. HEENT: Normal  Neck: Supple without bruits or JVD. Lungs:  Resp regular and unlabored, expiratory wheeze along upper lung fields. No rales appreciated. Heart: RRR no s3, s4, or murmurs. Abdomen: Soft, non-tender, non-distended, BS + x 4.  Extremities: No clubbing, cyanosis or lower extremity edema. DP/PT/Radials 2+ and equal bilaterally.   EKG:  The EKG was personally reviewed and  demonstrates: Sinus bradycardia, heart rate, 58 with LAFB. No acute ST or T-wave changes when compared to prior tracings.   Labs/Studies     Relevant CV Studies:  NST: 08/2016  No diagnostic ST segment changes to indicate ischemia.  Small, mild intensity, apical lateral defect that is fixed and more prominent on rest imaging which is consistent with soft tissue attenuation. No large ischemic territories noted.  This is a low risk study.  Nuclear stress EF: 76%.  Echocardiogram: 04/2017 Study Conclusions  - Left ventricle: The cavity size was normal. Wall thickness was   increased in a pattern of mild LVH. Systolic function was normal.   The estimated ejection fraction was in the range of 55% to 60%.   Wall motion was normal; there were no regional wall motion   abnormalities. Doppler parameters are consistent with abnormal   left ventricular relaxation (grade 1 diastolic dysfunction).   Doppler parameters are consistent with high ventricular filling   pressure. - Mitral valve: Calcified annulus. There was mild regurgitation. - Left atrium: The atrium was mildly dilated.  Impressions:  - Normal LV systolic function; mild diastolic dysfunction; mild   LVH; sclerotic aortic valve; mild MR; mild LAE; mild TR.   Laboratory Data:  Chemistry Recent Labs  Lab 06/10/18 1122 06/11/18 0328  NA 139 139  K 4.4 4.2  CL 104 106  CO2 28 29  GLUCOSE 278* 141*  BUN 17 14  CREATININE 0.85 0.72  CALCIUM 9.2 8.5*  GFRNONAA >60 >60  GFRAA >60 >60  ANIONGAP 7 4*    Recent Labs  Lab 06/10/18 1136 06/11/18 0328  PROT 6.1* 5.9*  ALBUMIN 3.5 3.1*  AST 17 14*  ALT 13* 12*  ALKPHOS 79 67  BILITOT 0.5 0.4   Hematology Recent Labs  Lab 06/10/18 1122 06/11/18 0328  WBC 8.2 8.9  RBC 4.79 4.57  HGB 12.6 12.2  HCT 41.6 40.4  MCV 86.8 88.4  MCH 26.3 26.7  MCHC 30.3 30.2  RDW 14.5 14.2  PLT 252 232   Cardiac Enzymes Recent Labs  Lab 06/10/18 1122 06/10/18 1545  06/10/18 2118 06/11/18 0328  TROPONINI <0.03 <0.03 <0.03 <0.03   No results for input(s): TROPIPOC in the last 168 hours.  BNPNo results for input(s): BNP, PROBNP in the last 168 hours.  DDimer No results for input(s): DDIMER in the last 168 hours.  Radiology/Studies:  Dg Chest 2 View  Result Date: 06/10/2018 CLINICAL DATA:  Weakness. EXAM: CHEST - 2 VIEW COMPARISON:  Radiographs of February 28, 2018. FINDINGS: The heart size and mediastinal contours are within normal limits. Status post coronary artery bypass graft. No pneumothorax or pleural effusion is noted. Both lungs are clear. The visualized skeletal structures are unremarkable. IMPRESSION: No active cardiopulmonary disease. Electronically Signed   By: Lupita Raider, M.D.   On: 06/10/2018 12:29   Ct Head Wo Contrast  Result Date: 06/10/2018 CLINICAL DATA:  Altered level of consciousness. EXAM: CT HEAD WITHOUT CONTRAST TECHNIQUE: Contiguous axial images were obtained from the base of the skull through the vertex without intravenous contrast. COMPARISON:  CT scan of May 10, 2017. FINDINGS: Brain: Mild chronic ischemic white matter disease is noted. Mild diffuse cortical atrophy is noted. No mass effect or midline shift is noted. Ventricular size is within normal limits. There is no evidence of mass lesion, hemorrhage or acute infarction. Vascular: No hyperdense vessel or unexpected calcification. Skull: Normal. Negative for fracture or focal lesion. Sinuses/Orbits: No acute finding. Other: None. IMPRESSION: Mild chronic ischemic white matter disease. Mild diffuse cortical atrophy. No acute intracranial abnormality seen. Electronically Signed   By: Lupita Raider, M.D.   On: 06/10/2018 12:35     Assessment & Plan    1. Syncope - The patient reports 2 syncopal events the day of admission with the first episode occurring following urination. She denies any prodromal symptoms. No associated headaches, vision changes, palpitations, chest  discomfort, or dyspnea. She has been found to have a UTI and has been started on Rocephin.  - CT Head showed mild chronic ischemic white matter disease with no acute intracranial abnormalities. Carotid Dopplers and an echocardiogram have been performed with official reports pending. Orthostatics were never obtained upon admission and she has been receiving IVF for over 24 hours so these likely be unrevealing.  - she has been bradycardiac with HR in the 50's to 60's by review of telemetry but no significant pauses or high-grade AV have been noted. Continue to follow on telemetry this admission. Can consider a 30-day monitor as an outpatient if echo and carotid dopplers show no acute findings but her episodes seems atypical for a cardiac arrhythmia and most consistent with a vasovagal etiology. Would avoid AV nodal blocking agents given her baseline bradycardia.   2. CAD - s/p CABG in 2001 with low-risk NST in 2012 and 08/2016. She reports one episode of chest discomfort at rest 3 weeks ago which spontaneously resolved with the use of sublingual  nitroglycerin. Denies any recent exertional symptoms. - Initial and cyclic troponin values have been negative and her EKG shows no acute ischemic changes when compared to prior tracings. An echocardiogram is pending to assess LV function and wall motion. If no significant abnormalities are noted, would not pursue further ischemic evaluation this admission. - continue ASA (would reduce from 325mg  to 81mg  daily). Not on BB therapy given baseline bradycardia.   3. HTN - BP has been variable at 128/55 - 157/63 within the past 24 hours. Continue to follow. She is currently on Amlodipine 5mg  daily and Lisinopril 10mg  daily and these can be further titrated if indicated.   4. HLD - followed by her PCP as an outpatient. FLP this admission shows total cholesterol 196, triglycerides 272, HDL 29, and LDL 113. Was on Pravachol in 2017.  Unclear why this was discontinued in  the interim.  5. UTI - urine culture positive for UTI. Has been started on Rocephin.  - per admitting team.    For questions or updates, please contact [facility] Please consult [email or web address] for contact info under Cardiology/STEMI.  Signed, Ellsworth Lennox, PA-C 06/11/2018, 4:41 PM Pager: [phone]  The patient was seen and examined, and I agree with the history, physical exam, assessment and plan as documented above, with modifications as noted below. I have also personally reviewed all relevant documentation, old records, labs, and both radiographic and cardiovascular studies. I have also independently interpreted old and new ECG's.  Briefly, the patient is a 82 year old woman with a history of coronary artery disease and CABG in 2001 who underwent low risk nuclear stress test in both 2012 and 2017.  She has a history of hypertension, hyperlipidemia, and prior CVA.  2 days ago, she had gone to the bathroom and urinated and after walking out of the bathroom she suddenly passed out.  She was assisted to the couch by her daughter and grandson and then passed out again.  She said both episodes were very transient.  She denies recurrent syncope at home.  She denies any antecedent chest pain, palpitations, or dyspnea.  She did have an episode of chest pain about 3 weeks ago relieved with 2 sublingual nitroglycerin tablets.  She has not been having frequent recurrent angina.  I personally reviewed the ECG which demonstrated sinus bradycardia with old anterior infarct with left axis deviation and first-degree AV block.  I reviewed telemetry which is not demonstrated any pauses.  I reviewed labs which are listed above with normal troponins.  Urinalysis and urine culture demonstrate UTI with E. Coli.  I reviewed the chest x-ray which showed no active cardiopulmonary disease.  Head CT showed mild chronic ischemic white matter disease with no acute intracranial abnormalities.  I  personally reviewed the echocardiogram which demonstrated vigorous left ventricular systolic function, LVEF 65 to 09%, with indeterminate grade diastolic dysfunction and high filling pressures.  There is aortic valve sclerosis without stenosis.  She has been on IV fluids and antimicrobial therapy.  Recommendations: It appears both episodes of syncope were vagally mediated shortly after urination.  This also occurred in the setting of a UTI.  I do not see any signs of high degree AV block on telemetry.  Left ventricular systolic function and regional wall motion are normal by echocardiography as demonstrated above.  I do not feel she requires any further cardiac testing at this time. Coronary artery disease appears to be symptomatically stable.  Energy levels are the same as from 1 year ago.  She has not been experiencing recurrent and frequent angina.  I do not feel stress testing is indicated.  Troponins have been normal.  I would recommend reducing aspirin 81 mg daily. LDL is 113.  She should be on statin therapy and I would recommend Lipitor 40 mg.  Aspirin should be reduced to 81 mg daily. Blood pressure is presently elevated.  This will need further monitoring the outpatient setting to determine if lisinopril or amlodipine should be increased.  Prentice Docker, MD, [facility]  06/11/2018 5:21 PM

## 2018-06-11 NOTE — Clinical Social Work Note (Signed)
Clinical Social Work Assessment  Patient Details  Name: [name] [name] MRN: [id number] Date of Birth: 11/15/1936  Date of referral:  06/11/18               Reason for consult:  Facility Placement, Discharge Planning                Permission sought to share information with:  Facility Art therapist granted to share information::     Name::        Agency::  [facility]  Relationship::     Contact Information:     Housing/Transportation Living arrangements for the past 2 months:  Single Family Home Source of Information:  Patient Patient Interpreter Needed:  None Criminal Activity/Legal Involvement Pertinent to Current Situation/Hospitalization:  No - Comment as needed Significant Relationships:  Spouse Lives with:  Spouse Do you feel safe going back to the place where you live?  Yes Need for family participation in patient care:  No (Coment)  Care giving concerns: PT recommending SNF rehab.   Social Worker assessment / plan: Pt is a 82 year old female referred to CSW for SNF rehab needs. Met with pt this AM to assess. Per pt, she lives with her husband here in [place]. Pt's husband is currently at [facility] for rehab and pt would like a referral there. Will start referral. Pt will need insurance auth prior to being able to admit to SNF.  Employment status:  Retired Nurse, adult PT Recommendations:  [place] / Referral to community resources:  [place]  Patient/Family's Response to care: Pt accepting of care.  Patient/Family's Understanding of and Emotional Response to Diagnosis, Current Treatment, and Prognosis: Pt appears to have a basic understanding of diagnosis and treatment recommendations (SNF PT) and no emotional distress identified.  Emotional Assessment Appearance:  Appears stated age Attitude/Demeanor/Rapport:  Lethargic Affect (typically observed):  Pleasant Orientation:   Oriented to Self, Oriented to Place Alcohol / Substance use:  Not Applicable Psych involvement (Current and /or in the community):  No (Comment)  Discharge Needs  Concerns to be addressed:  Discharge Planning Concerns Readmission within the last 30 days:  No Current discharge risk:  Physical Impairment Barriers to Discharge:  [place], LCSW 06/11/2018, 12:36 PM

## 2018-06-11 NOTE — Progress Notes (Signed)
EEG complete - results pending 

## 2018-06-12 DIAGNOSIS — E118 Type 2 diabetes mellitus with unspecified complications: Secondary | ICD-10-CM

## 2018-06-12 LAB — CBC WITH DIFFERENTIAL/PLATELET
Basophils Absolute: 0 10*3/uL (ref 0.0–0.1)
Basophils Relative: 0 %
EOS ABS: 0.5 10*3/uL (ref 0.0–0.7)
EOS PCT: 6 %
HCT: 41.3 % (ref 36.0–46.0)
Hemoglobin: 12.2 g/dL (ref 12.0–15.0)
LYMPHS PCT: 32 %
Lymphs Abs: 2.7 10*3/uL (ref 0.7–4.0)
MCH: 25.9 pg — AB (ref 26.0–34.0)
MCHC: 29.5 g/dL — AB (ref 30.0–36.0)
MCV: 87.7 fL (ref 78.0–100.0)
MONO ABS: 0.8 10*3/uL (ref 0.1–1.0)
Monocytes Relative: 10 %
Neutro Abs: 4.3 10*3/uL (ref 1.7–7.7)
Neutrophils Relative %: 52 %
PLATELETS: 255 10*3/uL (ref 150–400)
RBC: 4.71 MIL/uL (ref 3.87–5.11)
RDW: 14.2 % (ref 11.5–15.5)
WBC: 8.4 10*3/uL (ref 4.0–10.5)

## 2018-06-12 LAB — URINE CULTURE

## 2018-06-12 LAB — COMPREHENSIVE METABOLIC PANEL
ALBUMIN: 3 g/dL — AB (ref 3.5–5.0)
ALT: 36 U/L (ref 14–54)
AST: 60 U/L — AB (ref 15–41)
Alkaline Phosphatase: 77 U/L (ref 38–126)
Anion gap: 7 (ref 5–15)
BUN: 12 mg/dL (ref 6–20)
CO2: 29 mmol/L (ref 22–32)
CREATININE: 0.6 mg/dL (ref 0.44–1.00)
Calcium: 8.7 mg/dL — ABNORMAL LOW (ref 8.9–10.3)
Chloride: 105 mmol/L (ref 101–111)
GFR calc Af Amer: >60 mL/min (ref >60)
GLUCOSE: 117 mg/dL — AB (ref 65–99)
POTASSIUM: 3.9 mmol/L (ref 3.5–5.1)
Sodium: 141 mmol/L (ref 135–145)
Total Bilirubin: 0.3 mg/dL (ref 0.3–1.2)
Total Protein: 6 g/dL — ABNORMAL LOW (ref 6.5–8.1)

## 2018-06-12 LAB — GLUCOSE, CAPILLARY
Glucose-Capillary: 159 mg/dL — ABNORMAL HIGH (ref 65–99)
Glucose-Capillary: 236 mg/dL — ABNORMAL HIGH (ref 65–99)

## 2018-06-12 MED ORDER — CIPROFLOXACIN HCL 250 MG PO TABS: 500.0000 mg | ORAL_TABLET | Freq: Two times a day (BID) | ORAL | Status: DC

## 2018-06-12 MED ORDER — CEPHALEXIN 500 MG PO CAPS
500.0000 mg | ORAL_CAPSULE | Freq: Three times a day (TID) | ORAL | Status: DC
  Administered 2018-06-12: 500 mg via ORAL
  Filled 2018-06-12: qty 1

## 2018-06-12 MED ORDER — CEPHALEXIN 500 MG PO CAPS: 500.0000 mg | ORAL_CAPSULE | Freq: Three times a day (TID) | ORAL | 0 refills | Status: DC

## 2018-06-12 MED ORDER — CIPROFLOXACIN HCL 500 MG PO TABS: 500.0000 mg | ORAL_TABLET | Freq: Two times a day (BID) | ORAL | 0 refills | Status: DC

## 2018-06-12 MED ORDER — OXYCODONE HCL 5 MG PO TABS: 5.0000 mg | ORAL_TABLET | ORAL | 0 refills | Status: DC | PRN

## 2018-06-12 NOTE — Progress Notes (Signed)
Progress Note  Patient Name: [name] AuerRuby J [name] Date of Encounter: 06/12/2018  Primary Cardiologist: Previously evaluated by Dr. Diona BrownerMcDowell during admission in 08/2016 --> No outpatient follow-up since   Subjective   She denies chest pain.  She said she feels "achy all over ".  She does have some dizziness.  Inpatient Medications    Scheduled Meds: . amLODipine  5 mg Oral Daily  . aspirin EC  325 mg Oral Daily  . enoxaparin (LOVENOX) injection  40 mg Subcutaneous Q24H  . gabapentin  300 mg Oral BID  . insulin aspart  0-15 Units Subcutaneous TID WC  . insulin aspart protamine- aspart  40 Units Subcutaneous BID WC  . levothyroxine  25 mcg Oral QAC breakfast  . lisinopril  10 mg Oral Daily  . senna-docusate  1 tablet Oral QHS   Continuous Infusions: . sodium chloride 50 mL/hr at 06/11/18 1259  . cefTRIAXone (ROCEPHIN)  IV Stopped (06/11/18 1640)   PRN Meds: acetaminophen **OR** acetaminophen, hydrALAZINE, ipratropium-albuterol, nitroGLYCERIN, ondansetron **OR** ondansetron (ZOFRAN) IV, oxyCODONE   Vital Signs    Vitals:   06/11/18 2130 06/11/18 2210 06/11/18 2300 06/12/18 0452  BP: (!) 128/39 (!) 160/80 120/70 (!) 120/47  Pulse: 62   66  Resp:      Temp: 98.6 F (37 C)   98.5 F (36.9 C)  TempSrc: Oral   Oral  SpO2: 96%   93%  Weight:      Height:        Intake/Output Summary (Last 24 hours) at 06/12/2018 0903 Last data filed at 06/11/2018 1700 Gross per 24 hour  Intake 480 ml  Output 1000 ml  Net -520 ml   Filed Weights   06/10/18 1110  Weight: 180 lb (81.6 kg)    Telemetry    Sinus rhythm with PVCs- Personally Reviewed   Physical Exam   GEN: No acute distress.   Neck: No JVD Cardiac: RRR, no murmurs, rubs, or gallops.  Respiratory:  Diffuse rhonchi and scattered wheezes, no crackles. GI: Soft, nontender, non-distended  MS: No edema; No deformity. Neuro:  Nonfocal  Psych: Normal affect   Labs    Chemistry Recent Labs  Lab 06/10/18 1122  06/10/18 1136 06/11/18 0328 06/12/18 0424  NA 139  --  139 141  K 4.4  --  4.2 3.9  CL 104  --  106 105  CO2 28  --  29 29  GLUCOSE 278*  --  141* 117*  BUN 17  --  14 12  CREATININE 0.85  --  0.72 0.60  CALCIUM 9.2  --  8.5* 8.7*  PROT  --  6.1* 5.9* 6.0*  ALBUMIN  --  3.5 3.1* 3.0*  AST  --  17 14* 60*  ALT  --  13* 12* 36  ALKPHOS  --  79 67 77  BILITOT  --  0.5 0.4 0.3  GFRNONAA >60  --  >60 >60  GFRAA >60  --  >60 >60  ANIONGAP 7  --  4* 7     Hematology Recent Labs  Lab 06/10/18 1122 06/11/18 0328 06/12/18 0424  WBC 8.2 8.9 8.4  RBC 4.79 4.57 4.71  HGB 12.6 12.2 12.2  HCT 41.6 40.4 41.3  MCV 86.8 88.4 87.7  MCH 26.3 26.7 25.9*  MCHC 30.3 30.2 29.5*  RDW 14.5 14.2 14.2  PLT 252 232 255    Cardiac Enzymes Recent Labs  Lab 06/10/18 1122 06/10/18 1545 06/10/18 2118 06/11/18 0328  TROPONINI <0.03 <  0.03 <0.03 <0.03   No results for input(s): TROPIPOC in the last 168 hours.   BNPNo results for input(s): BNP, PROBNP in the last 168 hours.   DDimer No results for input(s): DDIMER in the last 168 hours.   Radiology    Dg Chest 2 View  Result Date: 06/10/2018 CLINICAL DATA:  Weakness. EXAM: CHEST - 2 VIEW COMPARISON:  Radiographs of February 28, 2018. FINDINGS: The heart size and mediastinal contours are within normal limits. Status post coronary artery bypass graft. No pneumothorax or pleural effusion is noted. Both lungs are clear. The visualized skeletal structures are unremarkable. IMPRESSION: No active cardiopulmonary disease. Electronically Signed   By: Lupita Raider, M.D.   On: 06/10/2018 12:29   Ct Head Wo Contrast  Result Date: 06/10/2018 CLINICAL DATA:  Altered level of consciousness. EXAM: CT HEAD WITHOUT CONTRAST TECHNIQUE: Contiguous axial images were obtained from the base of the skull through the vertex without intravenous contrast. COMPARISON:  CT scan of May 10, 2017. FINDINGS: Brain: Mild chronic ischemic white matter disease is noted. Mild  diffuse cortical atrophy is noted. No mass effect or midline shift is noted. Ventricular size is within normal limits. There is no evidence of mass lesion, hemorrhage or acute infarction. Vascular: No hyperdense vessel or unexpected calcification. Skull: Normal. Negative for fracture or focal lesion. Sinuses/Orbits: No acute finding. Other: None. IMPRESSION: Mild chronic ischemic white matter disease. Mild diffuse cortical atrophy. No acute intracranial abnormality seen. Electronically Signed   By: Lupita Raider, M.D.   On: 06/10/2018 12:35   US Carotid Bilateral  Result Date: 06/11/2018 CLINICAL DATA:  Syncopal episode and collapse. History of stroke, hypertension, CAD (post myocardial infarction and CABG) and diabetes. EXAM: BILATERAL CAROTID DUPLEX ULTRASOUND TECHNIQUE: Wallace Cullens scale imaging, color Doppler and duplex ultrasound were performed of bilateral carotid and vertebral arteries in the neck. COMPARISON:  08/02/2016; 03/19/2016 FINDINGS: Examination is degraded secondary to patient body habitus. Criteria: Quantification of carotid stenosis is based on velocity parameters that correlate the residual internal carotid diameter with NASCET-based stenosis levels, using the diameter of the distal internal carotid lumen as the denominator for stenosis measurement. The following velocity measurements were obtained: RIGHT ICA:  91/15 cm/sec CCA:  95/15 cm/sec SYSTOLIC ICA/CCA RATIO:  1.0 ECA:  130 cm/sec LEFT ICA:  120/30 cm/sec CCA:  41/21 cm/sec SYSTOLIC ICA/CCA RATIO:  1.2 ECA:  143 cm/sec RIGHT CAROTID ARTERY: There is a moderate amount of eccentric mixed echogenic plaque within the right carotid bulb (image 17 and 19), extending to involve the origin and proximal aspects the right internal carotid artery (image 30), morphologically progressed compared to the 07/2016 examination though again not definitely resulting in elevated peak systolic velocities within the interrogated course the right internal carotid  artery to suggest a hemodynamically significant stenosis. RIGHT VERTEBRAL ARTERY:  Antegrade flow LEFT CAROTID ARTERY: There is a moderate to large amount eccentric echogenic atherosclerotic plaque within the left carotid bulb (image 58), progressed compared to the 07/2016 examination, and while not definitely resulting elevated peak systolic velocities within pterygoid course the left internal carotid artery, the degree of stenosis is estimated to be at the 50-69% luminal narrowing range. LEFT VERTEBRAL ARTERY:  Antegrade flow IMPRESSION: 1. Body habitus degraded examination demonstrates interval progression of now moderate to large amount of left-sided atherosclerotic plaque morphologically resulting in a hemodynamically significant stenosis estimated to be within the 50-69% luminal narrowing range. Further evaluation with CTA could performed as clinically indicated. 2. Moderate amount of right-sided  atherosclerotic plaque, not definitely resulting in a hemodynamically significant stenosis. Electronically Signed   By: Simonne Come M.D.   On: 06/11/2018 17:53    Cardiac Studies   Echocardiogram (06/11/18):  Study Conclusions  - Left ventricle: The cavity size was normal. There was mild   concentric hypertrophy. Systolic function was vigorous. The   estimated ejection fraction was in the range of 65% to 70%. Wall   motion was normal; there were no regional wall motion   abnormalities. Findings consistent with left ventricular   diastolic dysfunction, grade indeterminate. Doppler parameters   are consistent with high ventricular filling pressure. - Aortic valve: Mildly to moderately calcified annulus. Trileaflet;   mildly thickened, mildly calcified leaflets. There was no   stenosis. - Mitral valve: Mildly calcified annulus. Normal thickness leaflets - Atrial septum: No defect or patent foramen ovale was identified. - Systemic veins: IVC is dilated with normal respiratory variation.   Estimated  CVP 8 mmHg.   NST: 08/2016  No diagnostic ST segment changes to indicate ischemia.  Small, mild intensity, apical lateral defect that is fixed and more prominent on rest imaging which is consistent with soft tissue attenuation. No large ischemic territories noted.  This is a low risk study.  Nuclear stress EF: 76%.    Patient Profile     82 y.o. female with a past medical history of coronary artery disease and CABG in 2001, low risk nuclear stress test in 2012 and 2017, hypertension, hyperlipidemia, and prior CVA whom we are asked to evaluate for syncope and bradycardia.  Assessment & Plan    1.  Syncope: The patient reported 2 syncopal episodes the day of admission with the first episode occurring following urination.  Both episodes were very transient.  The ECG on admission showed sinus bradycardia with old anterior infarct and left axis deviation with first-degree AV block.  Telemetry thus far has been unrevealing with no evidence of significant bradycardia or pauses.  Left ventricular systolic function and regional wall motion are normal.  I do not feel she requires any cardiac testing at this time.  She can follow-up in our office in the outpatient setting at which time an event monitor could be considered.  2.  Coronary disease: CABG in 2001 with low risk nuclear stress test in 2012 and 2017.  Symptomatically stable.  Left ventricular systolic function and regional wall motion are normal.  I would recommend reducing aspirin 81 mg daily.  Not on beta-blockers given baseline bradycardia.  She also requires statin therapy and I would recommend Lipitor 40 mg.  3.  Hypertension: Pressure is controlled on present therapy.  No changes.  4.  Hyperlipidemia: LDL is 113.  She has a history of CABG.  I would recommend Lipitor 40 mg.  5.  UTI: Urine culture positive for E. coli.  Currently on ceftriaxone.  Disposition: We will sign off.  She can follow-up with Dr. Diona Browner in the outpatient  setting.   For questions or updates, please contact [facility] Please consult [email or web address] for contact info under Cardiology/STEMI.      Signed, Prentice Docker, MD  06/12/2018, 9:03 AM

## 2018-06-12 NOTE — Progress Notes (Signed)
Physical Therapy Treatment Patient Details Name: [name] AuerRuby J [name] MRN: [id number] DOB: Aug 23, 1936 Today's Date: 06/12/2018    History of Present Illness [name] [name] is a 82 y.o. female with a complex past medical history as detailed below but significant for coronary artery disease, COPD, hypertension, diabetes mellitus and hypothyroidism who presented to the emergency department after she apparently had an episode of syncope where she completely lost consciousness twice this morning when she was getting up from her bedroom.  She says that she had no symptoms of chest pain or shortness of breath and had been feeling fairly well with the exception of being sleepy.  The patient says that she does not remember the event but she does remember waking up with her children yelling at her because she had passed out.  She says that the episode happened a second time shortly after that.  She arrived at the emergency department and she was evaluated.  She was found to be bradycardic and she was noted to have normal blood pressure.  She says that she has no history of seizures.  She says that she does not have a history of syncope.  She says that she had never passed out before.     PT Comments    Pt received in bed and was agreeable to PT treatment. Pt with improved mobility throughout this date, though she is still unsteady during transfers and gait with RW and is very limited due to fatigue/strength deficits. Able to ambulate 3240ft this date with 1 noted LE buckling but pt able to maintain balance independently with RW. Cadence noted to be very slow and short with overall gait generally unsteady, all of which puts pt at risk for falls. Returned to chair with call bell and phone within reach. Continue to recommend SNF upon d/c due to deficits in functional mobility, strength, endurance, gait, and balance.     Follow Up Recommendations  SNF;Supervision/Assistance - 24 hour     Equipment Recommendations  None recommended by PT    Recommendations for Other Services       Precautions / Restrictions Precautions Precautions: Fall Restrictions Weight Bearing Restrictions: No    Mobility  Bed Mobility Overal bed mobility: Needs Assistance Bed Mobility: Supine to Sit     Supine to sit: Supervision     General bed mobility comments: slow labored   Transfers Overall transfer level: Needs assistance Equipment used: Quad cane;Rolling walker (2 wheeled) Transfers: Sit to/from UGI CorporationStand;Stand Pivot Transfers Sit to Stand: Min guard Stand pivot transfers: Min assist;Min guard       General transfer comment: generally unsteady during transfers and gait  Ambulation/Gait Ambulation/Gait assistance: Min assist Gait Distance (Feet): 40 Feet Assistive device: Rolling walker (2 wheeled) Gait Pattern/deviations: Decreased step length - right;Decreased step length - left;Decreased stride length Gait velocity: slow Gait velocity interpretation: <1.8 ft/sec, indicate of risk for recurrent falls General Gait Details: continued slow, labored cadence, very short stride with cueing to improve, continued cues to stay within RW BOS; Multiple brief standing rest breaks due to fatigue, 1 bout of LE buckling but able to maintain balance independently with RW   Stairs             Wheelchair Mobility    Modified Rankin (Stroke Patients Only)       Balance Overall balance assessment: Needs assistance Sitting-balance support: Feet supported;No upper extremity supported Sitting balance-Leahy Scale: Fair Sitting balance - Comments: fair/good   Standing balance support: Single extremity supported;During functional  activity Standing balance-Leahy Scale: Poor Standing balance comment: fair with RW                            Cognition                                              Exercises General Exercises - Lower Extremity Gluteal Sets: Strengthening;Both;10  reps;Seated Long Arc Quad: Strengthening;Both;10 reps;Seated Hip Flexion/Marching: Strengthening;Both;10 reps;Seated Toe Raises: Strengthening;Both;Seated;10 reps Heel Raises: Strengthening;Both;10 reps;Seated    General Comments        Pertinent Vitals/Pain Pain Assessment: 0-10 Pain Score: 4  Pain Location: chronic low back Pain Descriptors / Indicators: Aching Pain Intervention(s): Limited activity within patient's tolerance;Repositioned;Monitored during session    Home Living                      Prior Function            PT Goals (current goals can now be found in the care plan section) Acute Rehab PT Goals Patient Stated Goal: return home PT Goal Formulation: With patient Time For Goal Achievement: 06/25/18 Potential to Achieve Goals: Good    Frequency    Min 3X/week      PT Plan      Co-evaluation              AM-PAC PT "6 Clicks" Daily Activity  Outcome Measure  Difficulty turning over in bed (including adjusting bedclothes, sheets and blankets)?: A Little Difficulty moving from lying on back to sitting on the side of the bed? : A Little Difficulty sitting down on and standing up from a chair with arms (e.g., wheelchair, bedside commode, etc,.)?: A Little Help needed moving to and from a bed to chair (including a wheelchair)?: A Little Help needed walking in hospital room?: A Lot Help needed climbing 3-5 steps with a railing? : A Lot 6 Click Score: 16    End of Session Equipment Utilized During Treatment: Gait belt Activity Tolerance: Patient tolerated treatment well;Patient limited by fatigue Patient left: in chair;with chair alarm set;with call bell/phone within reach Nurse Communication: Mobility status;Other (comment)(RN made aware that IV was leaking slightly and occluded in wrist) PT Visit Diagnosis: Unsteadiness on feet (R26.81);Other abnormalities of gait and mobility (R26.89);Muscle weakness (generalized) (M62.81)     Time:  1610-9604 PT Time Calculation (min) (ACUTE ONLY): 17 min  Charges:  $Therapeutic Activity: 8-22 mins                    G Codes:          Jac Canavan PT, DPT

## 2018-06-12 NOTE — Care Management (Signed)
Original place was for DC to SNF. Pt now wanting to go home. Okay with HH services. [facility].  Aware HH has 48 hrs to make first visit. Olegario MessierKathy, [facility] aware of [facility] and will pull pt info.

## 2018-06-12 NOTE — Discharge Summary (Signed)
Physician Discharge Summary  [name] [name] LKG:[id number] DOB: [birth year] DOA: 06/10/2018  PCP: Oval Linsey, MD  Admit date: 06/10/2018 Discharge date: 06/12/2018   Recommendations for Outpatient Follow-up:  Patient is advised to take Keflex tablet 1 p.o. 3 times daily for 7 days for UTI likewise to take full dose aspirin and all of her other outpatient medicines before hospital admission including hypertensives and insulin 70/30 40 units before meals twice daily she is likewise instructed to follow-up in my office on Tuesday 3 or 4 days from them to assess renal function UTI symptoms any dizziness or syncopal or presyncopal episodes as well as glycemic control Discharge Diagnoses:  Principal Problem:   Acute lower UTI Active Problems:   Hypothyroidism   DM type 2 causing complication (HCC)   Hyperlipidemia   Essential hypertension   Sleep apnea   Chronic back pain   Chronic obstructive pulmonary disease (HCC)   Weakness generalized   OSA (obstructive sleep apnea)   CAD (coronary artery disease)   Diastolic dysfunction   Loss of consciousness (HCC)   Hyperglycemia   Bradycardia, sinus   Discharge Condition: Good  Filed Weights   06/10/18 1110  Weight: 81.6 kg (180 lb)    History of present illness:  Patient is an 82 year old white female who has a history of CABG x3 in 2001 with multiple stents since that time negative nuclear stress test in 2007 and 2017 is normal systolic function TIA/CVA previously has hypertension hyperlipidemia who had a true syncopal episode at home she had bradycardia ranging in sinus rhythm from 50s to 65 bpm while in hospital she was seen in consultation by cardiology who saw no significant sinus arrest or AV nodal pauses her 2D echo revealed no significant abnormality with normal systolic function simply diastolic dysfunction and carotid ultrasounds revealed left-sided 50 to 69% hemodynamically significant stenosis and she is on aspirin this  will be followed up as an outpatient she likewise had a UTI was placed on Rocephin in hospital switch to Keflex 250 p.o. 3 times daily for 7 days to be followed up as an outpatient during her hospital stay she was hemodynamically stable with no orthostatic changes and no episodes of dizziness Hospital Course:  See HPI above  Procedures:  2D echo and carotid ultrasound  Consultations: Cardiology Discharge Instructions  Discharge Instructions    Discharge instructions   Complete by:  As directed      Allergies as of 06/12/2018      Reactions   Codeine Itching      Medication List    STOP taking these medications   naproxen 500 MG tablet Commonly known as:  NAPROSYN     TAKE these medications   amLODipine 5 MG tablet Commonly known as:  NORVASC Take 5 mg by mouth daily.   aspirin EC 325 MG tablet Take 325 mg by mouth daily.   ciprofloxacin 500 MG tablet Commonly known as:  CIPRO Take 1 tablet (500 mg total) by mouth 2 (two) times daily.   gabapentin 300 MG capsule Commonly known as:  NEURONTIN Take 300 mg by mouth 2 (two) times daily.   insulin NPH-regular Human (70-30) 100 UNIT/ML injection Commonly known as:  NOVOLIN 70/30 Inject 40 Units into the skin 2 (two) times daily.   ipratropium-albuterol 0.5-2.5 (3) MG/3ML Soln Commonly known as:  DUONEB Inhale 3 mLs into the lungs 4 (four) times daily as needed.   levothyroxine 25 MCG tablet Commonly known as:  SYNTHROID, LEVOTHROID Take  25 mcg by mouth daily.   lisinopril 10 MG tablet Commonly known as:  PRINIVIL,ZESTRIL Take 10 mg by mouth daily.   metFORMIN 500 MG tablet Commonly known as:  GLUCOPHAGE Take 500 mg by mouth 2 (two) times daily with a meal.   nitroGLYCERIN 0.4 MG SL tablet Commonly known as:  NITROSTAT Place 0.4 mg under the tongue every 5 (five) minutes as needed for chest pain.   oxyCODONE 5 MG immediate release tablet Commonly known as:  Oxy IR/ROXICODONE Take 1 tablet (5 mg total) by  mouth every 4 (four) hours as needed (Moderate pain). What changed:    medication strength  when to take this  reasons to take this   triamcinolone cream 0.1 % Commonly known as:  KENALOG APPLY TO AFFECTED AREA TWICE A DAY AS DIRECTED FOR FLARES      Allergies  Allergen Reactions  . Codeine Itching      The results of significant diagnostics from this hospitalization (including imaging, microbiology, ancillary and laboratory) are listed below for reference.    Significant Diagnostic Studies: Dg Chest 2 View  Result Date: 06/10/2018 CLINICAL DATA:  Weakness. EXAM: CHEST - 2 VIEW COMPARISON:  Radiographs of February 28, 2018. FINDINGS: The heart size and mediastinal contours are within normal limits. Status post coronary artery bypass graft. No pneumothorax or pleural effusion is noted. Both lungs are clear. The visualized skeletal structures are unremarkable. IMPRESSION: No active cardiopulmonary disease. Electronically Signed   By: Lupita RaiderJames  Green Jr, M.D.   On: 06/10/2018 12:29   Ct Head Wo Contrast  Result Date: 06/10/2018 CLINICAL DATA:  Altered level of consciousness. EXAM: CT HEAD WITHOUT CONTRAST TECHNIQUE: Contiguous axial images were obtained from the base of the skull through the vertex without intravenous contrast. COMPARISON:  CT scan of May 10, 2017. FINDINGS: Brain: Mild chronic ischemic white matter disease is noted. Mild diffuse cortical atrophy is noted. No mass effect or midline shift is noted. Ventricular size is within normal limits. There is no evidence of mass lesion, hemorrhage or acute infarction. Vascular: No hyperdense vessel or unexpected calcification. Skull: Normal. Negative for fracture or focal lesion. Sinuses/Orbits: No acute finding. Other: None. IMPRESSION: Mild chronic ischemic white matter disease. Mild diffuse cortical atrophy. No acute intracranial abnormality seen. Electronically Signed   By: Lupita RaiderJames  Green Jr, M.D.   On: 06/10/2018 12:35   [place] Carotid  Bilateral  Result Date: 06/11/2018 CLINICAL DATA:  Syncopal episode and collapse. History of stroke, hypertension, CAD (post myocardial infarction and CABG) and diabetes. EXAM: BILATERAL CAROTID DUPLEX ULTRASOUND TECHNIQUE: Wallace CullensGray scale imaging, color Doppler and duplex ultrasound were performed of bilateral carotid and vertebral arteries in the neck. COMPARISON:  08/02/2016; 03/19/2016 FINDINGS: Examination is degraded secondary to patient body habitus. Criteria: Quantification of carotid stenosis is based on velocity parameters that correlate the residual internal carotid diameter with NASCET-based stenosis levels, using the diameter of the distal internal carotid lumen as the denominator for stenosis measurement. The following velocity measurements were obtained: RIGHT ICA:  91/15 cm/sec CCA:  95/15 cm/sec SYSTOLIC ICA/CCA RATIO:  1.0 ECA:  130 cm/sec LEFT ICA:  120/30 cm/sec CCA:  41/21 cm/sec SYSTOLIC ICA/CCA RATIO:  1.2 ECA:  143 cm/sec RIGHT CAROTID ARTERY: There is a moderate amount of eccentric mixed echogenic plaque within the right carotid bulb (image 17 and 19), extending to involve the origin and proximal aspects the right internal carotid artery (image 30), morphologically progressed compared to the 07/2016 examination though again not definitely resulting in elevated  peak systolic velocities within the interrogated course the right internal carotid artery to suggest a hemodynamically significant stenosis. RIGHT VERTEBRAL ARTERY:  Antegrade flow LEFT CAROTID ARTERY: There is a moderate to large amount eccentric echogenic atherosclerotic plaque within the left carotid bulb (image 58), progressed compared to the 07/2016 examination, and while not definitely resulting elevated peak systolic velocities within pterygoid course the left internal carotid artery, the degree of stenosis is estimated to be at the 50-69% luminal narrowing range. LEFT VERTEBRAL ARTERY:  Antegrade flow IMPRESSION: 1. Body habitus  degraded examination demonstrates interval progression of now moderate to large amount of left-sided atherosclerotic plaque morphologically resulting in a hemodynamically significant stenosis estimated to be within the 50-69% luminal narrowing range. Further evaluation with CTA could performed as clinically indicated. 2. Moderate amount of right-sided atherosclerotic plaque, not definitely resulting in a hemodynamically significant stenosis. Electronically Signed   By: Simonne Come M.D.   On: 06/11/2018 17:53    Microbiology: Recent Results (from the past 240 hour(s))  Urine Culture     Status: Abnormal   Collection Time: 06/10/18  2:19 PM  Result Value Ref Range Status   Specimen Description   Final    URINE, RANDOM Performed at [facility], [address] [place], [place] [address]    Special Requests   Final    NONE Performed at [facility], [address], [place], [place] [address]    Culture >=100,000 COLONIES/mL ESCHERICHIA COLI (A)  Final   Report Status 06/12/2018 FINAL  Final   Organism ID, Bacteria ESCHERICHIA COLI (A)  Final      Susceptibility   Escherichia coli - MIC*    AMPICILLIN >=32 RESISTANT Resistant     CEFAZOLIN <=4 SENSITIVE Sensitive     CEFTRIAXONE <=1 SENSITIVE Sensitive     CIPROFLOXACIN >=4 RESISTANT Resistant     GENTAMICIN <=1 SENSITIVE Sensitive     IMIPENEM <=0.25 SENSITIVE Sensitive     NITROFURANTOIN <=16 SENSITIVE Sensitive     TRIMETH/SULFA >=320 RESISTANT Resistant     AMPICILLIN/SULBACTAM >=32 RESISTANT Resistant     PIP/TAZO <=4 SENSITIVE Sensitive     Extended ESBL NEGATIVE Sensitive     * >=100,000 COLONIES/mL ESCHERICHIA COLI     Labs: Basic Metabolic Panel: Recent Labs  Lab 06/10/18 1122 06/11/18 0328 06/11/18 1409 06/12/18 0424  NA 139 139  --  141  K 4.4 4.2  --  3.9  CL 104 106  --  105  CO2 28 29  --  29  GLUCOSE 278* 141*  --  117*  BUN 17 14  --  12  CREATININE 0.85 0.72  --  0.60  CALCIUM 9.2 8.5*  --  8.7*  MG  --    --  1.9  --    Liver Function Tests: Recent Labs  Lab 06/10/18 1136 06/11/18 0328 06/12/18 0424  AST 17 14* 60*  ALT 13* 12* 36  ALKPHOS 79 67 77  BILITOT 0.5 0.4 0.3  PROT 6.1* 5.9* 6.0*  ALBUMIN 3.5 3.1* 3.0*   No results for input(s): LIPASE, AMYLASE in the last 168 hours. No results for input(s): AMMONIA in the last 168 hours. CBC: Recent Labs  Lab 06/10/18 1122 06/11/18 0328 06/12/18 0424  WBC 8.2 8.9 8.4  NEUTROABS 4.6 3.9 4.3  HGB 12.6 12.2 12.2  HCT 41.6 40.4 41.3  MCV 86.8 88.4 87.7  PLT 252 232 255   Cardiac Enzymes: Recent Labs  Lab 06/10/18 1122 06/10/18 1545 06/10/18 2118 06/11/18  0328  TROPONINI <0.03 <0.03 <0.03 <0.03   BNP: BNP (last 3 results) Recent Labs    02/28/18 1708  BNP 72.0    ProBNP (last 3 results) No results for input(s): PROBNP in the last 8760 hours.  CBG: Recent Labs  Lab 06/11/18 0737 06/11/18 1122 06/11/18 1646 06/11/18 2148 06/12/18 0832  GLUCAP 138* 99 129* 172* 159*       Signed:  Uzoma Vivona M   Pager: [phone] 06/12/2018, 11:07 AM

## 2018-06-12 NOTE — Care Management Important Message (Signed)
Important Message  Patient Details  Name: [name] [name] MRN: [id number] Date of Birth: [birth year]   Medicare Important Message Given:  Yes    Renie OraHawkins, [place] Cordts Smith 06/12/2018, 10:40 AM

## 2018-06-12 NOTE — Progress Notes (Signed)
Patient discharged home today.Patient refuses to [facility] to [facility] but does state that she could use home health. Patient vital signs WDL. IV removed and site WDL. Discharge Instructions including follow up appointments, medications, and education reviewed with patient. Patient verbalizes understanding. Patient is transported out via wheelchair.

## 2018-06-12 NOTE — Clinical Social Work Note (Signed)
LCSW following. [facility] now states that she prefers to [facility] with [facility]. Updated RN CM and MD. Ger[facility] Fraction[facility] Will sign off.

## 2018-06-15 DIAGNOSIS — R55 Syncope and collapse: Secondary | ICD-10-CM | POA: Insufficient documentation

## 2018-06-15 NOTE — Procedures (Signed)
HPI:  82 y/o with syncope  TECHNICAL SUMMARY:  A multichannel referential and bipolar montage EEG using the standard international 10-20 system was performed on the patient described as awake, drowsy and asleep.  The dominant background activity consists of 9.5-10 hertz activity seen most prominantly over the posterior head region.   Low voltage fast (beta) activity is distributed symmetrically and maximally over the anterior head regions.  ACTIVATION:  Stepwise photic stimulation and hyperventilation were not performed.  EPILEPTIFORM ACTIVITY:  There were no spikes, sharp waves or paroxysmal activity.  SLEEP: Stage I and stage II sleep architecture were identified.  IMPRESSION:  This is a normal EEG for the patients stated age.  There were no focal, hemispheric or lateralizing features.  No epileptiform activity was recorded.  A normal EEG does not exclude the diagnosis of a seizure disorder and if seizure remains high on the list of differential diagnosis, an ambulatory EEG may be of value.  Clinical correlation is required.  Note: This is a late read as reading group was not informed that EEG needed read.

## 2018-06-29 ENCOUNTER — Encounter (HOSPITAL_COMMUNITY): Payer: Self-pay | Admitting: Emergency Medicine

## 2018-06-29 ENCOUNTER — Emergency Department (HOSPITAL_COMMUNITY)
Admission: EM | Admit: 2018-06-29 | Discharge: 2018-06-29 | Disposition: A | Discharge status: Home / Self Care | Payer: Medicare HMO | Source: Home / Self Care

## 2018-06-29 ENCOUNTER — Emergency Department (HOSPITAL_COMMUNITY): Payer: Medicare HMO

## 2018-06-29 ENCOUNTER — Other Ambulatory Visit: Payer: Self-pay

## 2018-06-29 ENCOUNTER — Emergency Department (HOSPITAL_COMMUNITY)
Admission: EM | Admit: 2018-06-29 | Discharge: 2018-06-30 | Disposition: A | Discharge status: Home / Self Care | Payer: Medicare HMO | Attending: Emergency Medicine | Admitting: Emergency Medicine

## 2018-06-29 DIAGNOSIS — R0781 Pleurodynia: Secondary | ICD-10-CM

## 2018-06-29 DIAGNOSIS — E039 Hypothyroidism, unspecified: Secondary | ICD-10-CM | POA: Insufficient documentation

## 2018-06-29 DIAGNOSIS — Y999 Unspecified external cause status: Secondary | ICD-10-CM | POA: Insufficient documentation

## 2018-06-29 DIAGNOSIS — E119 Type 2 diabetes mellitus without complications: Secondary | ICD-10-CM | POA: Insufficient documentation

## 2018-06-29 DIAGNOSIS — Z79899 Other long term (current) drug therapy: Secondary | ICD-10-CM | POA: Insufficient documentation

## 2018-06-29 DIAGNOSIS — Y939 Activity, unspecified: Secondary | ICD-10-CM | POA: Diagnosis not present

## 2018-06-29 DIAGNOSIS — I5030 Unspecified diastolic (congestive) heart failure: Secondary | ICD-10-CM | POA: Diagnosis not present

## 2018-06-29 DIAGNOSIS — S20219A Contusion of unspecified front wall of thorax, initial encounter: Secondary | ICD-10-CM

## 2018-06-29 DIAGNOSIS — Z794 Long term (current) use of insulin: Secondary | ICD-10-CM | POA: Diagnosis not present

## 2018-06-29 DIAGNOSIS — I110 Hypertensive heart disease with heart failure: Secondary | ICD-10-CM | POA: Insufficient documentation

## 2018-06-29 DIAGNOSIS — J449 Chronic obstructive pulmonary disease, unspecified: Secondary | ICD-10-CM | POA: Diagnosis not present

## 2018-06-29 DIAGNOSIS — Y929 Unspecified place or not applicable: Secondary | ICD-10-CM | POA: Insufficient documentation

## 2018-06-29 DIAGNOSIS — Z7982 Long term (current) use of aspirin: Secondary | ICD-10-CM | POA: Insufficient documentation

## 2018-06-29 DIAGNOSIS — S299XXA Unspecified injury of thorax, initial encounter: Secondary | ICD-10-CM | POA: Diagnosis present

## 2018-06-29 DIAGNOSIS — W0110XA Fall on same level from slipping, tripping and stumbling with subsequent striking against unspecified object, initial encounter: Secondary | ICD-10-CM | POA: Diagnosis not present

## 2018-06-29 DIAGNOSIS — Z5321 Procedure and treatment not carried out due to patient leaving prior to being seen by health care provider: Secondary | ICD-10-CM

## 2018-06-29 DIAGNOSIS — I2510 Atherosclerotic heart disease of native coronary artery without angina pectoris: Secondary | ICD-10-CM | POA: Insufficient documentation

## 2018-06-29 MED ORDER — HYDROCODONE-ACETAMINOPHEN 5-325 MG PO TABS: 1.0000 | ORAL_TABLET | ORAL | 0 refills | Status: DC | PRN

## 2018-06-29 MED ORDER — HYDROMORPHONE HCL 1 MG/ML IJ SOLN
0.5000 mg | Freq: Once | INTRAMUSCULAR | Status: AC
  Administered 2018-06-29: 0.5 mg via INTRAMUSCULAR
  Filled 2018-06-29: qty 1

## 2018-06-29 NOTE — ED Triage Notes (Signed)
Pt states she tripped over a rug and fell striking her chest. Pt c/o left chest pain that radiates to the back.

## 2018-06-29 NOTE — ED Triage Notes (Signed)
PT states she fell forward after tripping over a rug at the door and presents to ED c/o left sided rib pain.

## 2018-06-29 NOTE — Discharge Instructions (Addendum)
Chest x-ray and EKG were normal.  You will be sore for several days.  Prescription for pain medicine.  Return if worse.

## 2018-06-30 NOTE — ED Provider Notes (Signed)
[facility] EMERGENCY DEPARTMENT Provider Note   CSN: [id number] Arrival date & time: 06/29/18  2119     History   Chief Complaint Chief Complaint  Patient presents with  . Fall    HPI [name] [name] is a 82 y.o. female.  Accidental trip and fall striking her chest earlier today.  Complains of anterior chest wall pain left greater than right radiating to the back.  Pain was initiated directly after the fall.  No substernal chest pain, dyspnea, diaphoresis, nausea.  Severity is moderate.  Palpation makes pain worse.     Past Medical History:  Diagnosis Date  . Arthritis   . CAD (coronary artery disease)    Multivessel status post CABG 2001  . Chronic obstructive pulmonary disease (HCC)   . DDD (degenerative disc disease), lumbar   . Diastolic CHF (HCC)   . Essential hypertension   . Hematuria   . History of CVA (cerebrovascular accident)    2013 - lacunar infarct right thalamus - residual left side of  lip numb  and left eye vision worse (which corrented lens implant from cataract extraction)  . History of MI (myocardial infarction)    July 2001  . Hyperlipidemia   . Hypothyroidism   . Insulin dependent type 2 diabetes mellitus (HCC)   . Lesion of bladder   . Lower urinary tract symptoms (LUTS)   . OSA (obstructive sleep apnea)   . Peripheral neuropathy   . Psoriasis   . SUI (stress urinary incontinence, female)   . Varicose veins     Patient Active Problem List   Diagnosis Date Noted  . Syncope   . Loss of consciousness (HCC) 06/10/2018  . Hyperglycemia 06/10/2018  . Bradycardia, sinus 06/10/2018  . Diastolic dysfunction 09/29/2017  . CAD (coronary artery disease) 09/28/2017  . Acute lower UTI 09/28/2017  . Ischemic stroke (HCC) 05/10/2017  . Acute ischemic stroke (HCC) 05/10/2017  . Stroke (HCC) 08/01/2016  . Dysarthria 03/18/2016  . Anemia of chronic disease 11/19/2015  . OSA (obstructive sleep apnea) 11/19/2015  . Left facial numbness 08/29/2015    . Gait instability 06/26/2015  . COPD exacerbation (HCC) 11/13/2014  . Weakness generalized 12/10/2013  . TIA (transient ischemic attack) 06/22/2012  . Left-sided headache 06/22/2012  . Noncompliance 06/22/2012  . Noncompliance with CPAP treatment 06/22/2012  . Psoriasis 01/07/2012  . Cerebrovascular disease   . Chronic back pain   . Chronic obstructive pulmonary disease (HCC)   . DM type 2 causing complication (HCC) 02/14/2010  . Obesity 02/14/2010  . Arteriosclerotic cardiovascular disease (ASCVD) 02/14/2010  . Hypothyroidism 02/06/2010  . Hyperlipidemia 02/06/2010  . Essential hypertension 02/06/2010  . Sleep apnea 02/06/2010    Past Surgical History:  Procedure Laterality Date  . APPENDECTOMY  age 18  . CARDIAC CATHETERIZATION  1991   No sig. cad  . CARDIAC CATHETERIZATION  07-01-2000   high grade in-stent restenosis  . CARDIAC CATHETERIZATION  04-30-2001   single native vessel cad/ graft patent  . CARDIAC CATHETERIZATION  02-13-2006  dr Jenness Corner cad with total occluded LAD  and  70% ostial of small ramus/  grafts patent/  normal ef  . CARDIAC CATHETERIZATION  01-20-2000  dr Clifton James   essentially unchanged;  severe single vessel cad with diffuse disease throughout CFX and RCA/ ef 66%;  chf with preserved LVSF  . CARDIOVASCULAR STRESS TEST  08-28-2011   DR ROTHBART   NEGATIVE NUCLEAR STUDY/  GLOBAL LVSF/  EF 65%  .  CARPAL TUNNEL RELEASE Bilateral yrs ago  . CATARACT EXTRACTION W/ INTRAOCULAR LENS  IMPLANT, BILATERAL  2013  . CHOLECYSTECTOMY  1990  . COLONOSCOPY N/A 07/26/2015   Procedure: COLONOSCOPY;  Surgeon: Malissa HippoNajeeb U Rehman, MD;  [facility]: [facility];  Service: Endoscopy;  Laterality: N/A;  1200  . CORONARY ANGIOPLASTY  11-13-1999   balloon angioplasty to mLAD , d2 of LAD  . CORONARY ANGIOPLASTY WITH STENT PLACEMENT  01-17-2000   PCI stenting to mLAD & D2 of LAD  . CORONARY ARTERY BYPASS GRAFT  07-02-2000   DR Tressie StalkerLARENCE OWEN   SVG to diagonal, LIMA to LAD  .  CYSTOSCOPY W/ RETROGRADES Bilateral 08/09/2014   Procedure: CYSTOSCOPY, BILATERAL RETROGRADE, HYDRODISTENSION, BLADDER BIOPSY WITH FULGERATION, INSTILL PYRIDIUM AND MARCAINE ;  Surgeon: Anner CreteJohn J Wrenn, MD;  [facility] [place];  Service: Urology;  Laterality: Bilateral;  . KNEE ARTHROSCOPY Left 10-18-2003   SYNOVECTOMY  . LUMBAR SPINE SURGERY  x2    yrs ago  . ORIF HUMERUS FRACTURE Right 04-18-2008  . REPAIR FLEXOR TENDON HAND Right 09-20-2008   CARPI RADIALIS TENDON TRANSFER TO EXTENSOR TO INDEX, MIDDLE, RING , LITTLE FINGERS AND DIGITI MININI  . ROTATOR CUFF REPAIR Right 1995  . TOTAL ABDOMINAL HYSTERECTOMY W/ BILATERAL SALPINGOOPHORECTOMY  age 82  . TRANSTHORACIC ECHOCARDIOGRAM  12-12-2013   MILD LVH/  EF 60-65%/  GRADE I DIASTOLIC DYSFUNCTION/  MILD LAE     OB History    Gravida  7   Para  7   Term  7   Preterm      AB      Living  4     SAB      TAB      Ectopic      Multiple      Live Births               Home Medications    Prior to Admission medications   Medication Sig Start Date End Date Taking? Authorizing Provider  amLODipine (NORVASC) 5 MG tablet Take 5 mg by mouth daily. 07/03/17   [provider]  aspirin EC 325 MG tablet Take 325 mg by mouth daily.    [provider]  cephALEXin (KEFLEX) 500 MG capsule Take 1 capsule (500 mg total) by mouth 3 (three) times daily. 06/12/18   Oval Linseyondiego, Richard, MD  gabapentin (NEURONTIN) 300 MG capsule Take 300 mg by mouth 2 (two) times daily. 09/18/17   [provider]  HYDROcodone-acetaminophen (NORCO/VICODIN) 5-325 MG tablet Take 1 tablet by mouth every 4 (four) hours as needed. 06/29/18   Donnetta Hutchingook, Mouhamed Glassco, MD  insulin NPH-regular Human (NOVOLIN 70/30) (70-30) 100 UNIT/ML injection Inject 40 Units into the skin 2 (two) times daily.    [provider]  ipratropium-albuterol (DUONEB) 0.5-2.5 (3) MG/3ML SOLN Inhale 3 mLs into the lungs 4 (four) times daily as needed. 09/19/17    [provider]  levothyroxine (SYNTHROID, LEVOTHROID) 25 MCG tablet Take 25 mcg by mouth daily.     [provider]  lisinopril (PRINIVIL,ZESTRIL) 10 MG tablet Take 10 mg by mouth daily.    [provider]  metFORMIN (GLUCOPHAGE) 500 MG tablet Take 500 mg by mouth 2 (two) times daily with a meal.      [provider]  nitroGLYCERIN (NITROSTAT) 0.4 MG SL tablet Place 0.4 mg under the tongue every 5 (five) minutes as needed for chest pain.     [provider]  oxyCODONE (OXY IR/ROXICODONE) 5 MG  immediate release tablet Take 1 tablet (5 mg total) by mouth every 4 (four) hours as needed (Moderate pain). 06/12/18   Oval Linsey, MD  triamcinolone cream (KENALOG) 0.1 % APPLY TO AFFECTED AREA TWICE A DAY AS DIRECTED FOR FLARES 05/04/18   [provider]    Family History Family History  Problem Relation Age of Onset  . Hypertension Mother   . Stroke Mother   . Coronary artery disease Unknown        Multiple first and second-degree relatives  . Aneurysm Unknown        Cerebral circulation  . Colon cancer Sister   . Stroke Sister   . Coronary artery disease Sister   . Clotting disorder Unknown        Children diagnosed with hypercoagulable state  . Coronary artery disease Brother     Social History Social History   Tobacco Use  . Smoking status: Never Smoker  . Smokeless tobacco: Never Used  Substance Use Topics  . Alcohol use: No  . Drug use: No     Allergies   Codeine   Review of Systems Review of Systems  All other systems reviewed and are negative.    Physical Exam Updated Vital Signs BP (!) 166/69   Pulse 60   Temp 98.5 F (36.9 C) (Oral)   Resp 17   Ht 5\' 4"  (1.626 m)   Wt 80.7 kg (178 lb)   SpO2 91%   BMI 30.55 kg/m   Physical Exam  Constitutional: She is oriented to person, place, and time. She appears well-developed and well-nourished.  HENT:  Head: Normocephalic and atraumatic.  Eyes:  Conjunctivae are normal.  Neck: Neck supple.  Cardiovascular: Normal rate and regular rhythm.  Pulmonary/Chest: Effort normal and breath sounds normal.  Diffuse tenderness anterior chest wall  Abdominal: Soft. Bowel sounds are normal.  Musculoskeletal: Normal range of motion.  Neurological: She is alert and oriented to person, place, and time.  Skin: Skin is warm and dry.  Psychiatric: She has a normal mood and affect. Her behavior is normal.  Nursing note and vitals reviewed.    ED Treatments / Results  Labs (all labs ordered are listed, but only abnormal results are displayed) Labs Reviewed - No data to display  EKG EKG Interpretation  Date/Time:  Monday June 29 2018 22:18:54 EDT Ventricular Rate:  67 PR Interval:  200 QRS Duration: 107 QT Interval:  425 QTC Calculation: 449 R Axis:   -71 Text Interpretation:  Sinus rhythm Prolonged PR interval Left anterior fascicular block Anterior infarct, old since last tracing no significant change Confirmed by Rolan Bucco [phone]) on 06/30/2018 8:05:36 AM   Radiology Dg Ribs Unilateral W/chest Left  Result Date: 06/29/2018 CLINICAL DATA:  Anterior chest pain after fall today. EXAM: LEFT RIBS AND CHEST - 3+ VIEW COMPARISON:  CXR 06/10/2018 FINDINGS: Status post CABG. Top-normal size heart. Minimal aortic atherosclerosis without aneurysmal dilatation. Scarring in the left upper lobe with left basilar atelectasis. No pulmonary consolidation or pneumothorax. No effusion. No acute displaced rib fracture. Thoracolumbar spondylosis with multilevel degenerative disc space narrowing and endplate spurring identified. Cholecystectomy clips are seen in the right upper quadrant. IMPRESSION: 1. No acute appearing rib fracture or pneumothorax. No pulmonary consolidation. 2. Left upper lobe scarring and left basilar atelectasis. 3. Aortic atherosclerosis. Electronically Signed   By: Tollie Eth M.D.   On: 06/29/2018 18:49    Procedures Procedures  (including critical care time)  Medications Ordered in ED Medications  HYDROmorphone (DILAUDID) injection 0.5 mg (0.5 mg Intramuscular Given 06/29/18 2240)  HYDROmorphone (DILAUDID) injection 0.5 mg (0.5 mg Intramuscular Given 06/29/18 2357)     Initial Impression / Assessment and Plan / ED Course  I have reviewed the triage vital signs and the nursing notes.  Pertinent labs & imaging results that were available during my care of the patient were reviewed by me and considered in my medical decision making (see chart for details).     Patient presents with chest wall pain after a trip and fall striking her chest.  Chest x-ray shows no fracture or pneumothorax.  EKG no acute changes.  Patient was given pain medicine in the emergency department and discharged home with Vicodin.  She was hemodynamically stable at discharge.  Final Clinical Impressions(s) / ED Diagnoses   Final diagnoses:  Contusion of chest wall, unspecified laterality, initial encounter    ED Discharge Orders        Ordered    HYDROcodone-acetaminophen (NORCO/VICODIN) 5-325 MG tablet  Every 4 hours PRN     06/29/18 2342       Donnetta Hutching, MD 06/30/18 [phone]

## 2018-07-04 ENCOUNTER — Emergency Department (HOSPITAL_COMMUNITY)
Admission: EM | Admit: 2018-07-04 | Discharge: 2018-07-05 | Disposition: A | Discharge status: Home / Self Care | Payer: Medicare HMO | Attending: Emergency Medicine | Admitting: Emergency Medicine

## 2018-07-04 DIAGNOSIS — I5030 Unspecified diastolic (congestive) heart failure: Secondary | ICD-10-CM | POA: Diagnosis not present

## 2018-07-04 DIAGNOSIS — Z955 Presence of coronary angioplasty implant and graft: Secondary | ICD-10-CM | POA: Diagnosis not present

## 2018-07-04 DIAGNOSIS — Y929 Unspecified place or not applicable: Secondary | ICD-10-CM | POA: Diagnosis not present

## 2018-07-04 DIAGNOSIS — Z794 Long term (current) use of insulin: Secondary | ICD-10-CM | POA: Insufficient documentation

## 2018-07-04 DIAGNOSIS — S20212A Contusion of left front wall of thorax, initial encounter: Secondary | ICD-10-CM | POA: Diagnosis not present

## 2018-07-04 DIAGNOSIS — Z9049 Acquired absence of other specified parts of digestive tract: Secondary | ICD-10-CM | POA: Diagnosis not present

## 2018-07-04 DIAGNOSIS — E119 Type 2 diabetes mellitus without complications: Secondary | ICD-10-CM | POA: Insufficient documentation

## 2018-07-04 DIAGNOSIS — S299XXA Unspecified injury of thorax, initial encounter: Secondary | ICD-10-CM | POA: Diagnosis present

## 2018-07-04 DIAGNOSIS — Z79899 Other long term (current) drug therapy: Secondary | ICD-10-CM | POA: Diagnosis not present

## 2018-07-04 DIAGNOSIS — I2510 Atherosclerotic heart disease of native coronary artery without angina pectoris: Secondary | ICD-10-CM | POA: Insufficient documentation

## 2018-07-04 DIAGNOSIS — Z8673 Personal history of transient ischemic attack (TIA), and cerebral infarction without residual deficits: Secondary | ICD-10-CM | POA: Diagnosis not present

## 2018-07-04 DIAGNOSIS — Y998 Other external cause status: Secondary | ICD-10-CM | POA: Insufficient documentation

## 2018-07-04 DIAGNOSIS — Z7982 Long term (current) use of aspirin: Secondary | ICD-10-CM | POA: Diagnosis not present

## 2018-07-04 DIAGNOSIS — Y9389 Activity, other specified: Secondary | ICD-10-CM | POA: Insufficient documentation

## 2018-07-04 DIAGNOSIS — I110 Hypertensive heart disease with heart failure: Secondary | ICD-10-CM | POA: Diagnosis not present

## 2018-07-04 DIAGNOSIS — W01198A Fall on same level from slipping, tripping and stumbling with subsequent striking against other object, initial encounter: Secondary | ICD-10-CM | POA: Diagnosis not present

## 2018-07-04 DIAGNOSIS — E039 Hypothyroidism, unspecified: Secondary | ICD-10-CM | POA: Diagnosis not present

## 2018-07-05 ENCOUNTER — Other Ambulatory Visit: Payer: Self-pay

## 2018-07-05 ENCOUNTER — Emergency Department (HOSPITAL_COMMUNITY): Payer: Medicare HMO

## 2018-07-05 ENCOUNTER — Encounter (HOSPITAL_COMMUNITY): Payer: Self-pay | Admitting: *Deleted

## 2018-07-05 MED ORDER — HYDROMORPHONE HCL 1 MG/ML IJ SOLN
1.0000 mg | Freq: Once | INTRAMUSCULAR | Status: AC
  Administered 2018-07-05: 1 mg via INTRAMUSCULAR
  Filled 2018-07-05: qty 1

## 2018-07-05 NOTE — ED Triage Notes (Signed)
Pt states that she tripped over a rug on 06/29/2018 landing on her chest, was seen in er at that time with chest xray that was negative per pt, pt states that she continues to have bilateral rib pain that is worse with movement, respirations,

## 2018-07-05 NOTE — ED Provider Notes (Signed)
[facility] EMERGENCY DEPARTMENT Provider Note   CSN: [id number] Arrival date & time: 07/04/18  2357     History   Chief Complaint Chief Complaint  Patient presents with  . Rib Injury    HPI [name] [name] is a 82 y.o. female.  Patient is a 82 year old female presenting with left-sided chest wall pain.  This is been ongoing for the past 4 days.  She reports falling and landing on her chest.  She was seen here after the fall where she had x-rays and was given pain medicine.  The medicine in the ER seem to help for a short period of time, however the pills she was prescribed upon discharge are not.  She reports continued pain with breathing, movement, and palpation.  The history is provided by the patient.    Past Medical History:  Diagnosis Date  . Arthritis   . CAD (coronary artery disease)    Multivessel status post CABG 2001  . Chronic obstructive pulmonary disease (HCC)   . DDD (degenerative disc disease), lumbar   . Diastolic CHF (HCC)   . Essential hypertension   . Hematuria   . History of CVA (cerebrovascular accident)    2013 - lacunar infarct right thalamus - residual left side of  lip numb  and left eye vision worse (which corrented lens implant from cataract extraction)  . History of MI (myocardial infarction)    July 2001  . Hyperlipidemia   . Hypothyroidism   . Insulin dependent type 2 diabetes mellitus (HCC)   . Lesion of bladder   . Lower urinary tract symptoms (LUTS)   . OSA (obstructive sleep apnea)   . Peripheral neuropathy   . Psoriasis   . SUI (stress urinary incontinence, female)   . Varicose veins     Patient Active Problem List   Diagnosis Date Noted  . Syncope   . Loss of consciousness (HCC) 06/10/2018  . Hyperglycemia 06/10/2018  . Bradycardia, sinus 06/10/2018  . Diastolic dysfunction 09/29/2017  . CAD (coronary artery disease) 09/28/2017  . Acute lower UTI 09/28/2017  . Ischemic stroke (HCC) 05/10/2017  . Acute ischemic stroke  (HCC) 05/10/2017  . Stroke (HCC) 08/01/2016  . Dysarthria 03/18/2016  . Anemia of chronic disease 11/19/2015  . OSA (obstructive sleep apnea) 11/19/2015  . Left facial numbness 08/29/2015  . Gait instability 06/26/2015  . COPD exacerbation (HCC) 11/13/2014  . Weakness generalized 12/10/2013  . TIA (transient ischemic attack) 06/22/2012  . Left-sided headache 06/22/2012  . Noncompliance 06/22/2012  . Noncompliance with CPAP treatment 06/22/2012  . Psoriasis 01/07/2012  . Cerebrovascular disease   . Chronic back pain   . Chronic obstructive pulmonary disease (HCC)   . DM type 2 causing complication (HCC) 02/14/2010  . Obesity 02/14/2010  . Arteriosclerotic cardiovascular disease (ASCVD) 02/14/2010  . Hypothyroidism 02/06/2010  . Hyperlipidemia 02/06/2010  . Essential hypertension 02/06/2010  . Sleep apnea 02/06/2010    Past Surgical History:  Procedure Laterality Date  . APPENDECTOMY  age 53  . CARDIAC CATHETERIZATION  1991   No sig. cad  . CARDIAC CATHETERIZATION  07-01-2000   high grade in-stent restenosis  . CARDIAC CATHETERIZATION  04-30-2001   single native vessel cad/ graft patent  . CARDIAC CATHETERIZATION  02-13-2006  dr Jenness Corner cad with total occluded LAD  and  70% ostial of small ramus/  grafts patent/  normal ef  . CARDIAC CATHETERIZATION  01-20-2000  dr Clifton James   essentially unchanged;  severe single  vessel cad with diffuse disease throughout CFX and RCA/ ef 66%;  chf with preserved LVSF  . CARDIOVASCULAR STRESS TEST  08-28-2011   DR ROTHBART   NEGATIVE NUCLEAR STUDY/  GLOBAL LVSF/  EF 65%  . CARPAL TUNNEL RELEASE Bilateral yrs ago  . CATARACT EXTRACTION W/ INTRAOCULAR LENS  IMPLANT, BILATERAL  2013  . CHOLECYSTECTOMY  1990  . COLONOSCOPY N/A 07/26/2015   Procedure: COLONOSCOPY;  Surgeon: Malissa HippoNajeeb U Rehman, MD;  [facility]: [facility];  Service: Endoscopy;  Laterality: N/A;  1200  . CORONARY ANGIOPLASTY  11-13-1999   balloon angioplasty to mLAD , d2 of LAD    . CORONARY ANGIOPLASTY WITH STENT PLACEMENT  01-17-2000   PCI stenting to mLAD & D2 of LAD  . CORONARY ARTERY BYPASS GRAFT  07-02-2000   DR Tressie StalkerLARENCE OWEN   SVG to diagonal, LIMA to LAD  . CYSTOSCOPY W/ RETROGRADES Bilateral 08/09/2014   Procedure: CYSTOSCOPY, BILATERAL RETROGRADE, HYDRODISTENSION, BLADDER BIOPSY WITH FULGERATION, INSTILL PYRIDIUM AND MARCAINE ;  Surgeon: Anner CreteJohn J Wrenn, MD;  [facility] [place];  Service: Urology;  Laterality: Bilateral;  . KNEE ARTHROSCOPY Left 10-18-2003   SYNOVECTOMY  . LUMBAR SPINE SURGERY  x2    yrs ago  . ORIF HUMERUS FRACTURE Right 04-18-2008  . REPAIR FLEXOR TENDON HAND Right 09-20-2008   CARPI RADIALIS TENDON TRANSFER TO EXTENSOR TO INDEX, MIDDLE, RING , LITTLE FINGERS AND DIGITI MININI  . ROTATOR CUFF REPAIR Right 1995  . TOTAL ABDOMINAL HYSTERECTOMY W/ BILATERAL SALPINGOOPHORECTOMY  age 82  . TRANSTHORACIC ECHOCARDIOGRAM  12-12-2013   MILD LVH/  EF 60-65%/  GRADE I DIASTOLIC DYSFUNCTION/  MILD LAE     OB History    Gravida  7   Para  7   Term  7   Preterm      AB      Living  4     SAB      TAB      Ectopic      Multiple      Live Births               Home Medications    Prior to Admission medications   Medication Sig Start Date End Date Taking? Authorizing Provider  amLODipine (NORVASC) 5 MG tablet Take 5 mg by mouth daily. 07/03/17   [provider]  aspirin EC 325 MG tablet Take 325 mg by mouth daily.    [provider]  cephALEXin (KEFLEX) 500 MG capsule Take 1 capsule (500 mg total) by mouth 3 (three) times daily. 06/12/18   Oval Linseyondiego, Richard, MD  gabapentin (NEURONTIN) 300 MG capsule Take 300 mg by mouth 2 (two) times daily. 09/18/17   [provider]  HYDROcodone-acetaminophen (NORCO/VICODIN) 5-325 MG tablet Take 1 tablet by mouth every 4 (four) hours as needed. 06/29/18   Donnetta Hutchingook, Brian, MD  insulin NPH-regular Human (NOVOLIN 70/30) (70-30) 100 UNIT/ML injection Inject 40  Units into the skin 2 (two) times daily.    [provider]  ipratropium-albuterol (DUONEB) 0.5-2.5 (3) MG/3ML SOLN Inhale 3 mLs into the lungs 4 (four) times daily as needed. 09/19/17   [provider]  levothyroxine (SYNTHROID, LEVOTHROID) 25 MCG tablet Take 25 mcg by mouth daily.     [provider]  lisinopril (PRINIVIL,ZESTRIL) 10 MG tablet Take 10 mg by mouth daily.    [provider]  metFORMIN (GLUCOPHAGE) 500 MG tablet Take 500 mg by mouth 2 (two) times daily with a meal.  [provider]  nitroGLYCERIN (NITROSTAT) 0.4 MG SL tablet Place 0.4 mg under the tongue every 5 (five) minutes as needed for chest pain.     [provider]  oxyCODONE (OXY IR/ROXICODONE) 5 MG immediate release tablet Take 1 tablet (5 mg total) by mouth every 4 (four) hours as needed (Moderate pain). 06/12/18   Oval Linsey, MD  triamcinolone cream (KENALOG) 0.1 % APPLY TO AFFECTED AREA TWICE A DAY AS DIRECTED FOR FLARES 05/04/18   [provider]    Family History Family History  Problem Relation Age of Onset  . Hypertension Mother   . Stroke Mother   . Coronary artery disease Unknown        Multiple first and second-degree relatives  . Aneurysm Unknown        Cerebral circulation  . Colon cancer Sister   . Stroke Sister   . Coronary artery disease Sister   . Clotting disorder Unknown        Children diagnosed with hypercoagulable state  . Coronary artery disease Brother     Social History Social History   Tobacco Use  . Smoking status: Never Smoker  . Smokeless tobacco: Never Used  Substance Use Topics  . Alcohol use: No  . Drug use: No     Allergies   Codeine   Review of Systems Review of Systems  All other systems reviewed and are negative.    Physical Exam Updated Vital Signs BP (!) 173/65   Pulse 65   Temp 97.8 F (36.6 C) (Oral)   Resp 18   Ht 5\' 4"  (1.626 m)   Wt 80.7 kg (178 lb)   SpO2 96%   BMI 30.55  kg/m   Physical Exam  Constitutional: She is oriented to person, place, and time. She appears well-developed and well-nourished. No distress.  HENT:  Head: Normocephalic and atraumatic.  Neck: Normal range of motion. Neck supple.  Cardiovascular: Normal rate and regular rhythm. Exam reveals no gallop and no friction rub.  No murmur heard. Pulmonary/Chest: Effort normal and breath sounds normal. No respiratory distress. She has no wheezes. She exhibits tenderness.  There is exquisite tenderness of the left anterior and lateral lower ribs.  Abdominal: Soft. Bowel sounds are normal. She exhibits no distension. There is no tenderness.  Musculoskeletal: Normal range of motion.  Neurological: She is alert and oriented to person, place, and time.  Skin: Skin is warm and dry. She is not diaphoretic.  Nursing note and vitals reviewed.    ED Treatments / Results  Labs (all labs ordered are listed, but only abnormal results are displayed) Labs Reviewed - No data to display  EKG None  Radiology No results found.  Procedures Procedures (including critical care time)  Medications Ordered in ED Medications  HYDROmorphone (DILAUDID) injection 1 mg (has no administration in time range)     Initial Impression / Assessment and Plan / ED Course  I have reviewed the triage vital signs and the nursing notes.  Pertinent labs & imaging results that were available during my care of the patient were reviewed by me and considered in my medical decision making (see chart for details).  Patient x-rays do not show a fracture of the rib or pneumothorax.  Her vital signs and oxygen saturations are normal.  I see no indication for any further imaging.  She likely has an occult rib fracture or rib contusion.  She states the Vicodin she was previously prescribed is not helping.  I  have advised her to continue taking the oxycodone that she is prescribed every month by her primary doctor.  She denies to me  that she has been filling these prescriptions which is a big inconsistency between what she tells me and what I see in the [facility] prescription database.  Final Clinical Impressions(s) / ED Diagnoses   Final diagnoses:  None    ED Discharge Orders    None       Geoffery Lyons, MD 07/05/18 [phone]

## 2018-07-05 NOTE — Discharge Instructions (Addendum)
Continue taking your oxycodone as previously prescribed by your primary doctor.  Follow-up with your primary doctor if symptoms are not improving in the next few days.

## 2018-08-06 IMAGING — CT CT HEAD CODE STROKE
3 of 4 series · 15 of 47 positions shown, 18 images · non-contrast
Comparison: Head CT and MRI 04/04/2016

CLINICAL DATA: Code stroke. Right-sided facial numbness and
tingling down the right side of the throat. Right arm weakness.

EXAM:
CT HEAD WITHOUT CONTRAST
TECHNIQUE: Contiguous axial images were obtained from the base of the skull
through the vertex without intravenous contrast.

[Series 2: head w/o · axial · non-contrast · 0.42mm/px · z∈[+119,+249]mm · 9 of 32 slices shown, 12 images]
[im 3/32  brain]
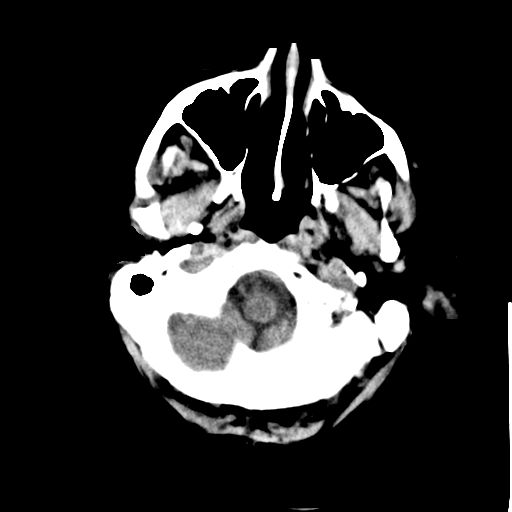
[im 3/32  bone]
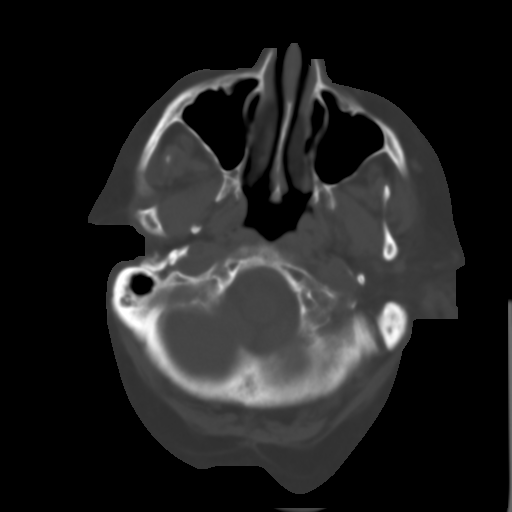
[im 7/32  brain]
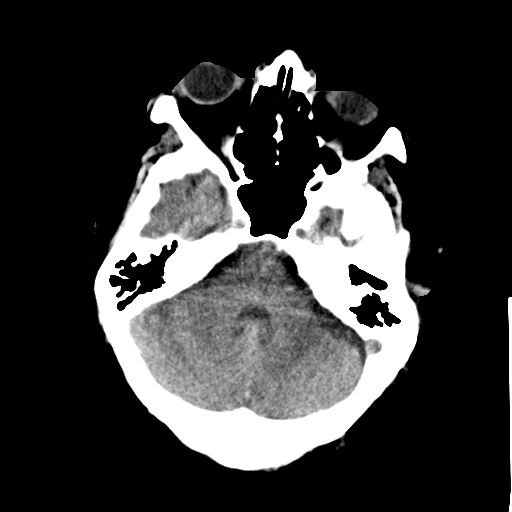
[im 9/32  brain]
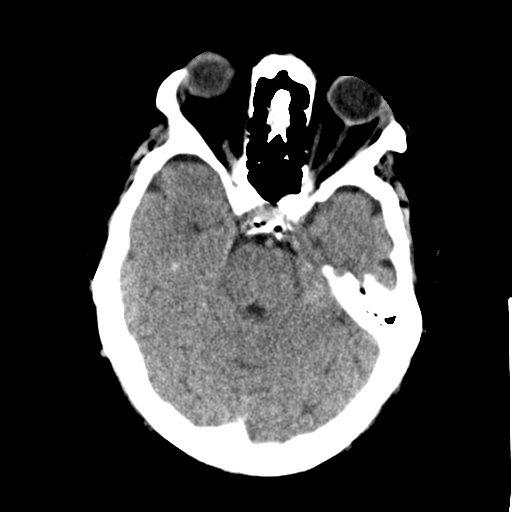
[im 14/32  brain]
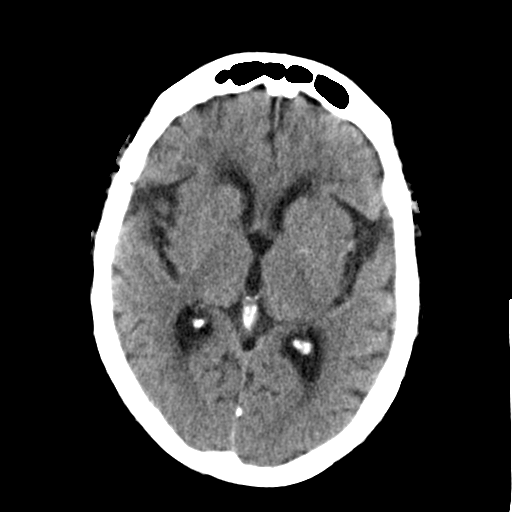
[im 16/32  brain]
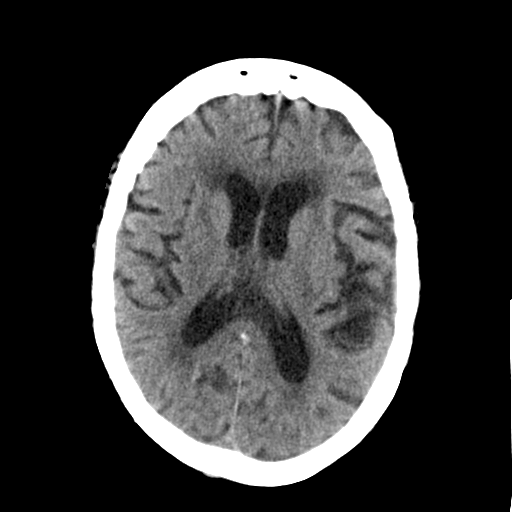
[im 16/32  bone]
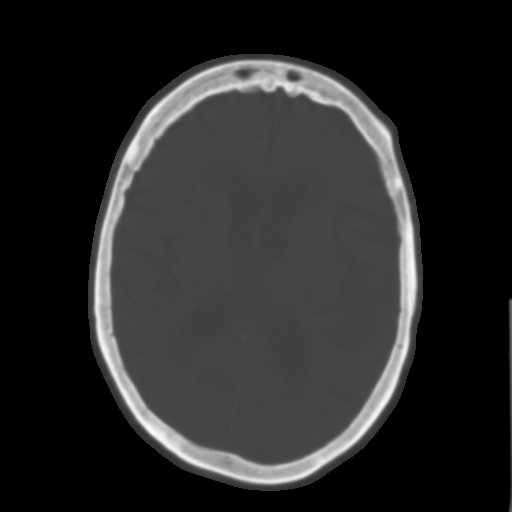
[im 18/32  brain]
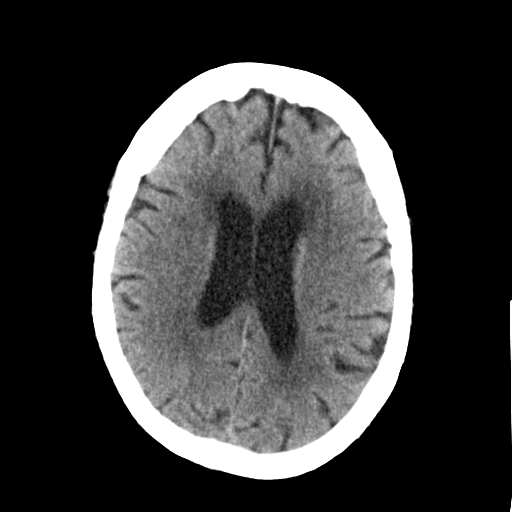
[im 23/32  brain]
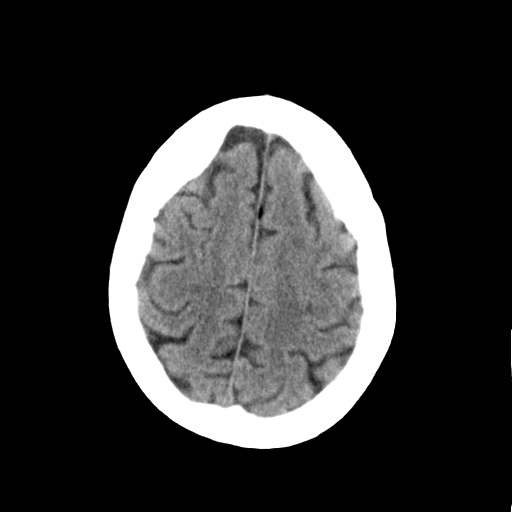
[im 25/32  brain]
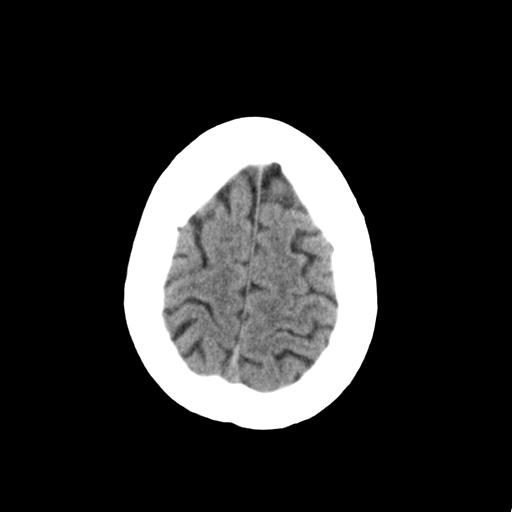
[im 29/32  brain]
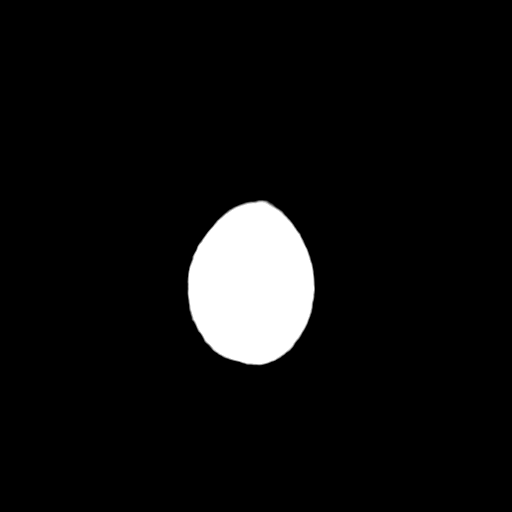
[im 29/32  bone]
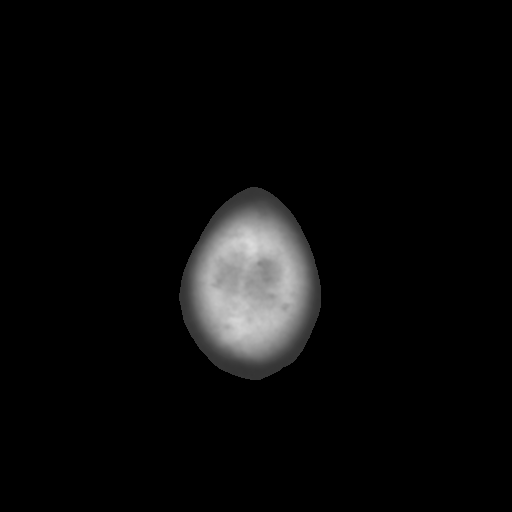

[Series 4: coronal · coronal · 0.28mm/px · 3 of 66 slices shown]
[im 22/66  brain]
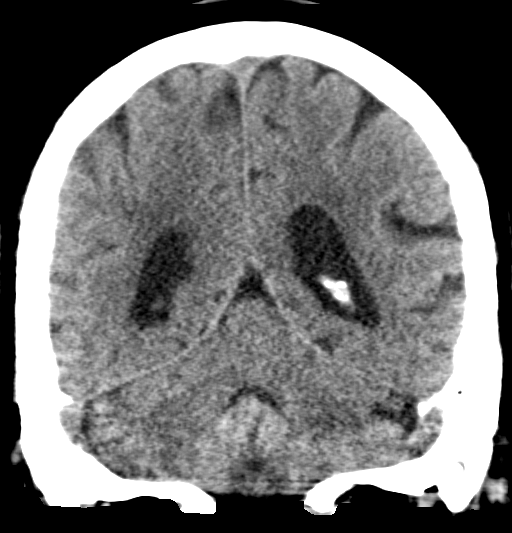
[im 29/66  brain]
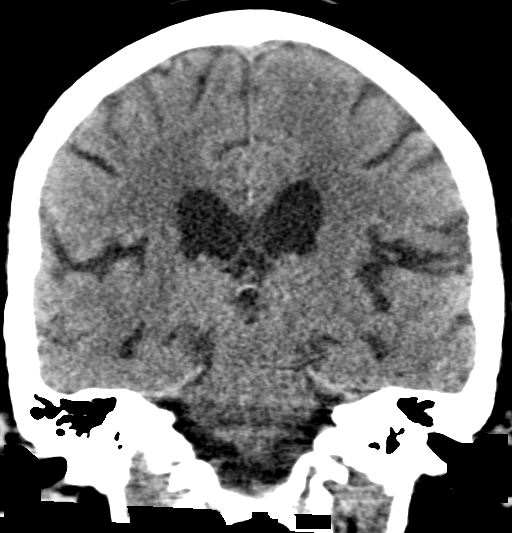
[im 37/66  brain]
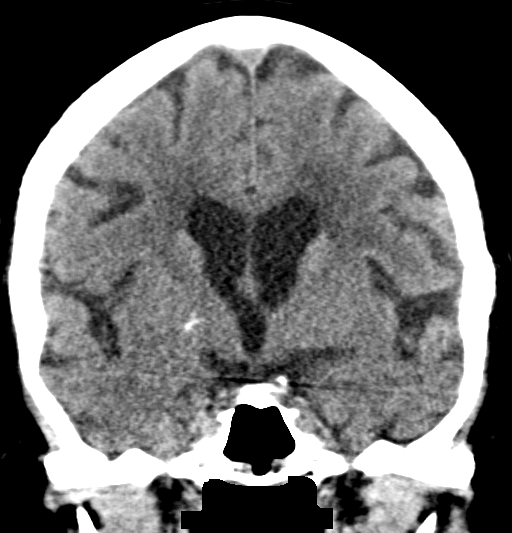

[Series 5: sagittal · sagittal · 0.32mm/px · 3 of 48 slices shown]
[im 16/48  brain]
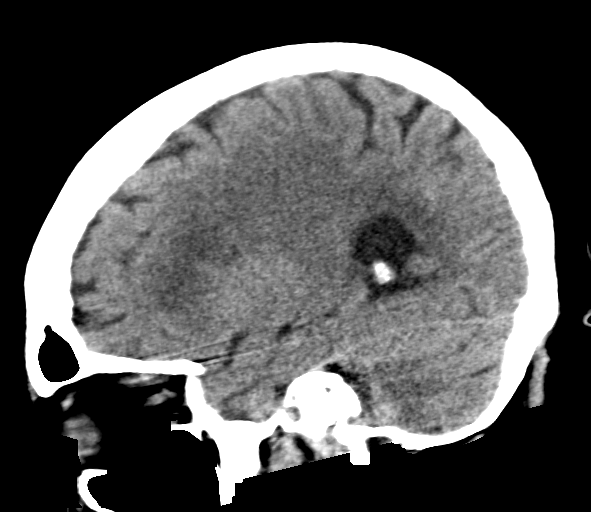
[im 24/48  brain]
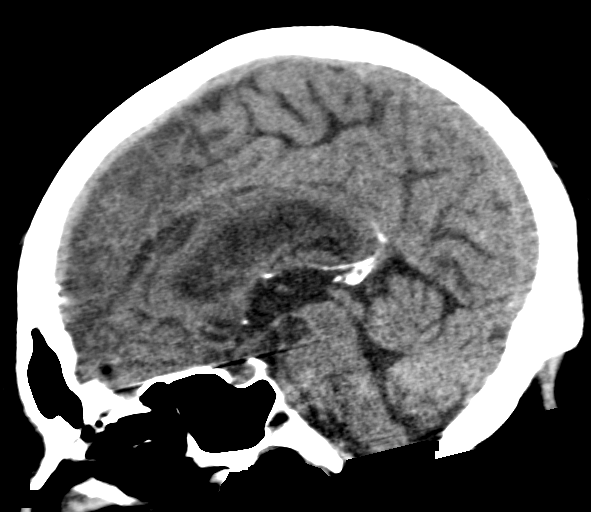
[im 32/48  brain]
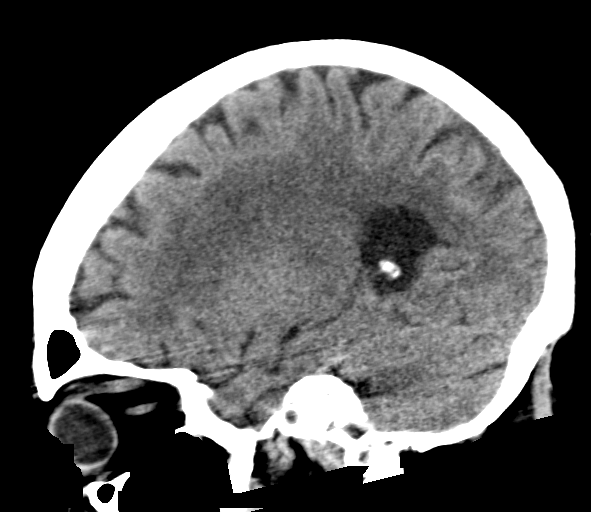

[15 of 47 positions shown; findings below may reference images not displayed]

FINDINGS: The study is mildly motion degraded without evidence of acute
intracranial hemorrhage are acute large territory infarct within
this limitation. No mass, midline shift, or extra-axial fluid
collection is seen. Cerebral atrophy is unchanged. Cerebral white
matter hypodensities are unchanged and nonspecific but compatible
with moderate chronic small vessel ischemic disease.

Prior bilateral cataract extraction is noted. There is opacification
of a posterior right ethmoid air cell. Chronic inferior left mastoid
air cell opacification is unchanged. Calcified atherosclerosis is
noted at the skullbase. No acute osseous abnormality is identified.

ASPECTS (Alberta Stroke Program Early CT Score,
[URL]

- Ganglionic level infarction (caudate, lentiform nuclei, internal
capsule, insula, M1-M3 cortex): 7

- Supraganglionic infarction (M4-M6 cortex): 3

Total score (0-10 with 10 being normal): 10
IMPRESSION: 1. Mild motion degradation without evidence of acute intracranial
abnormality.
2. ASPECTS score 10.
3. Moderate chronic small vessel ischemic disease.

These results were called by telephone at the time of interpretation
on 08/01/2016 at [DATE] to Dr. PUPLA MASTANDREA , who verbally
acknowledged these results.

## 2018-08-06 IMAGING — DX DG CHEST 2V
2 series · 2 of 2 positions shown · non-contrast
Comparison: 04/21/2016

CLINICAL DATA: Possible stroke.

EXAM:
CHEST  2 VIEW

[chest pa]
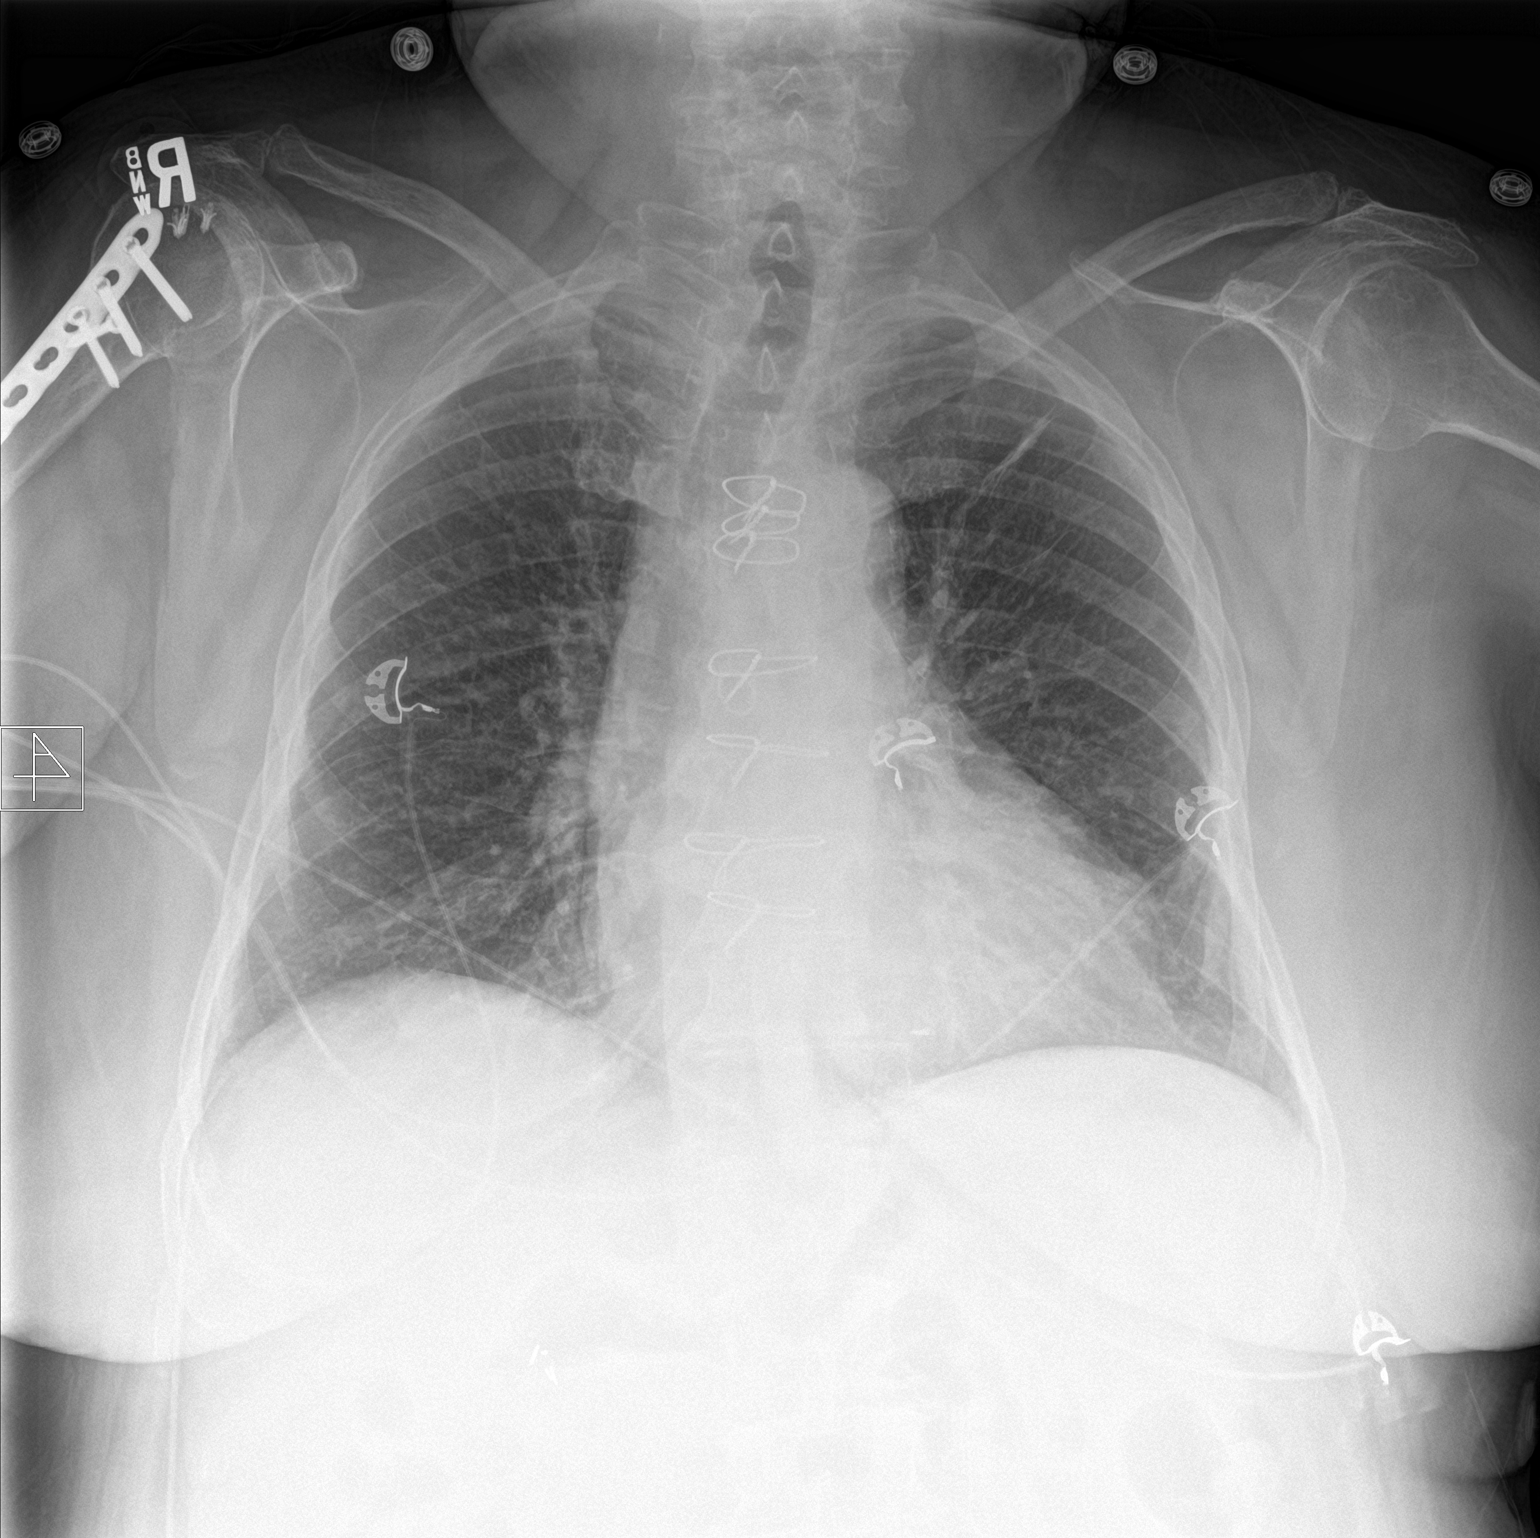

[chest lat]
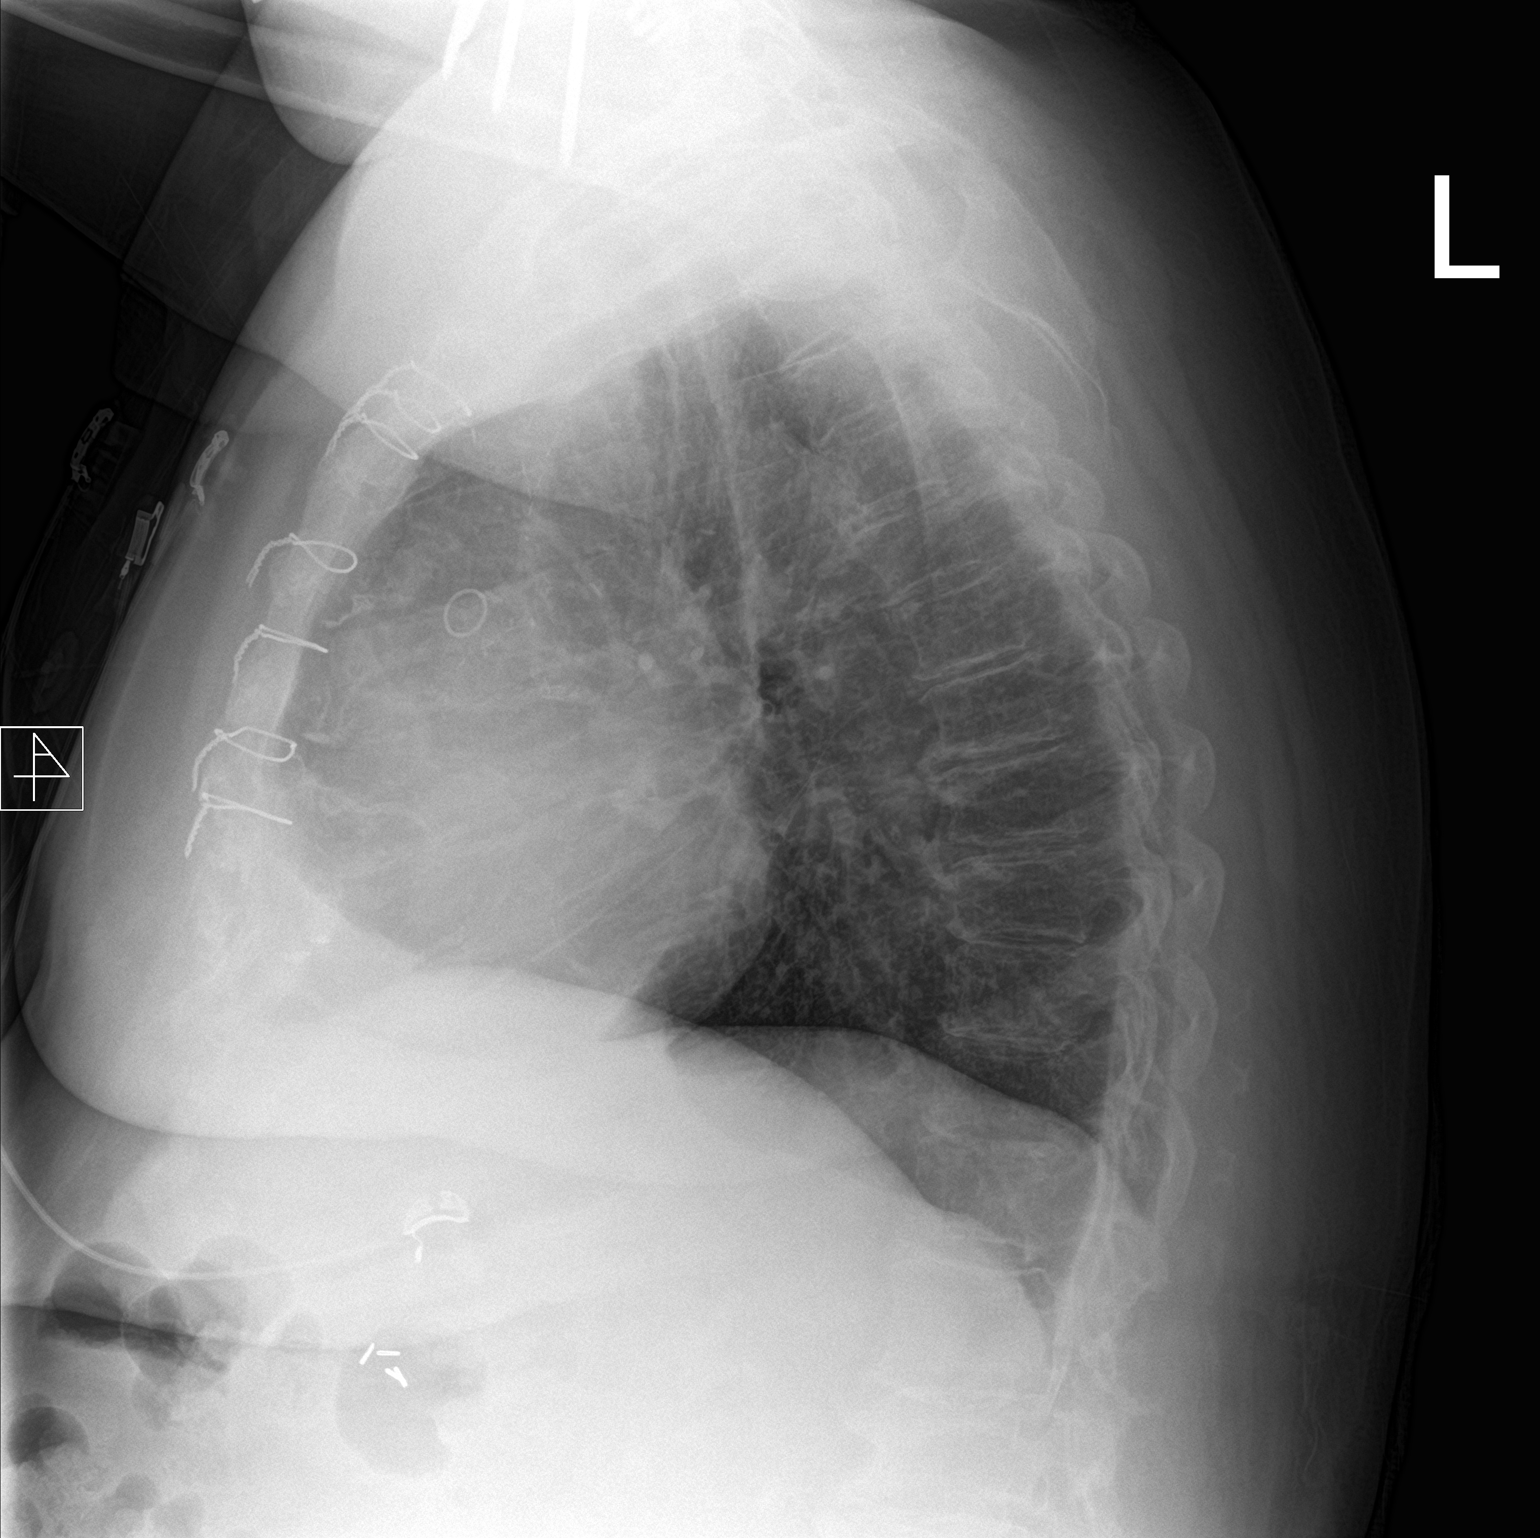

[2 of 2 positions shown; findings below may reference images not displayed]

FINDINGS: Postsurgical changes from median sternotomy and CABG are stable.

The cardiac silhouette is mildly enlarged. Mediastinal contours
appear intact.

There is no evidence of focal airspace consolidation, pleural
effusion or pneumothorax. Left upper lobe atelectasis is noted.

Osseous structures are without acute abnormality. Soft tissues are
grossly normal.
IMPRESSION: Cardiomegaly without evidence of pulmonary edema.

Left upper lobe atelectasis.

## 2018-08-07 IMAGING — MR MR MRA HEAD W/O CM
1 series · 16 of 48 positions shown · non-contrast
Comparison: MRI brain from the same day. MRA circle of Willis
03/18/2016.

CLINICAL DATA: New onset of right facial numbness. Abnormal feeling
in the right upper extremity.

EXAM:
MRA HEAD WITHOUT CONTRAST
TECHNIQUE: Angiographic images of the Circle of Willis were obtained using MRA
technique without intravenous contrast.

[Series 1: MRA · axial · 0.8mm · 0.38mm/px · z∈[-26,+81]mm · 16 of 143 slices shown]
[im 1/143]
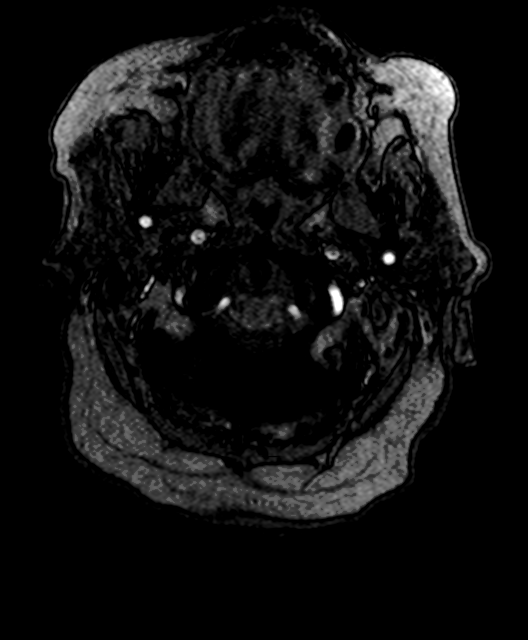
[im 4/143]
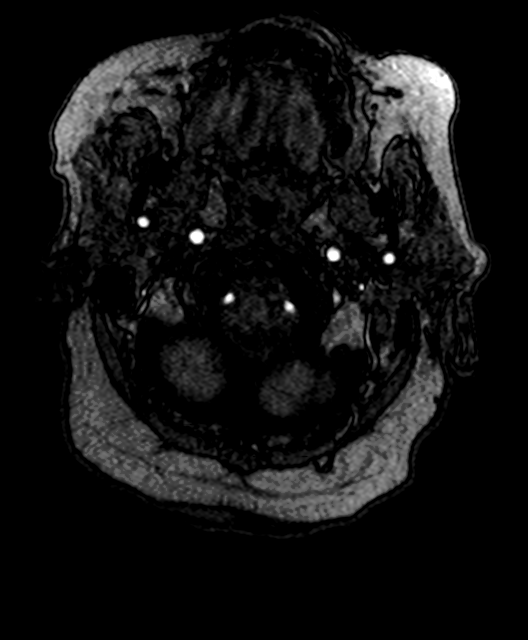
[im 7/143]
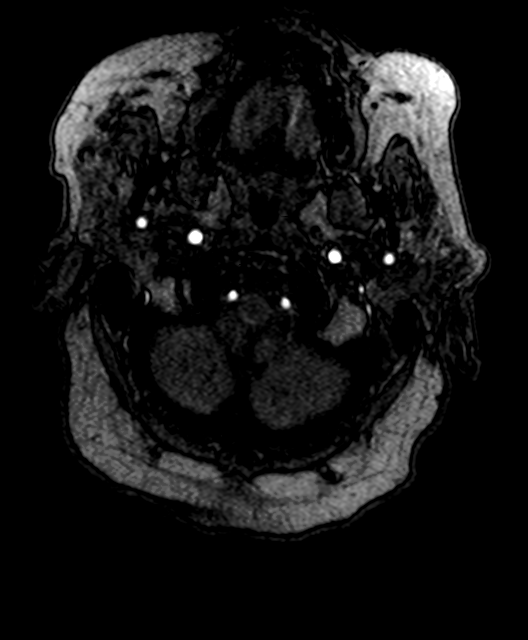
[im 10/143]
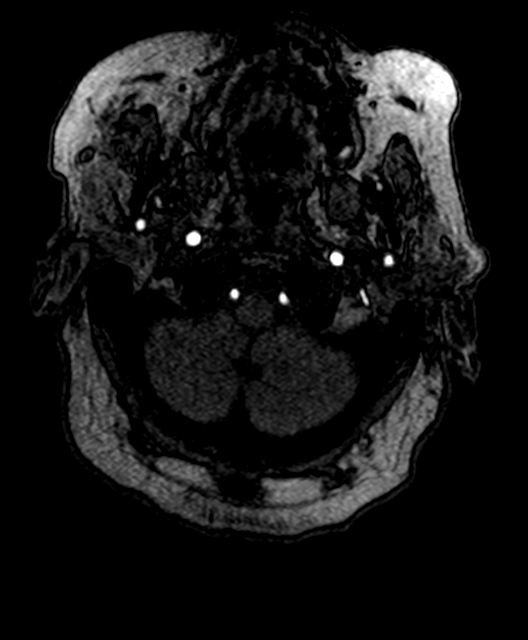
[im 13/143]
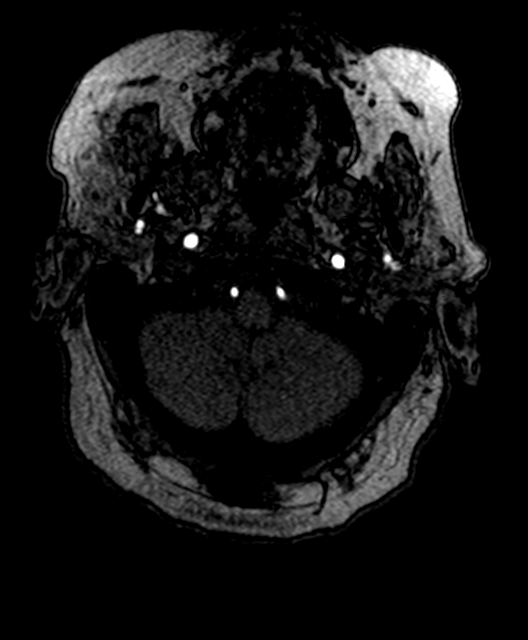
[im 16/143]
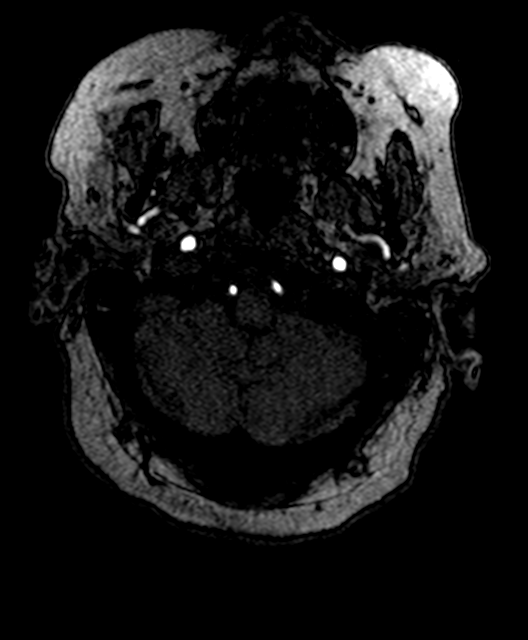
[im 25/143]
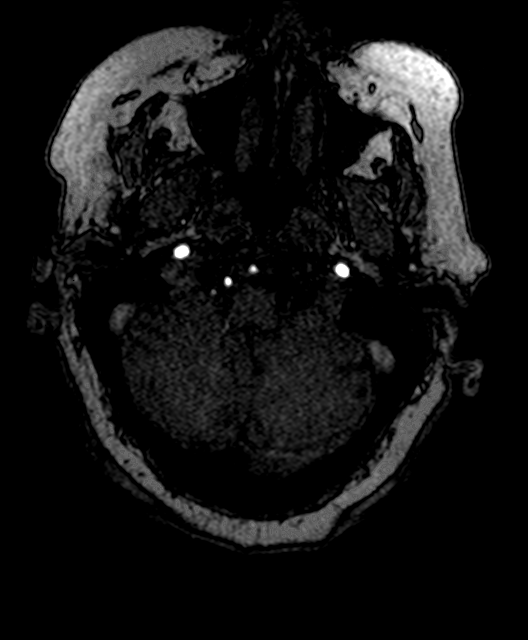
[im 28/143]
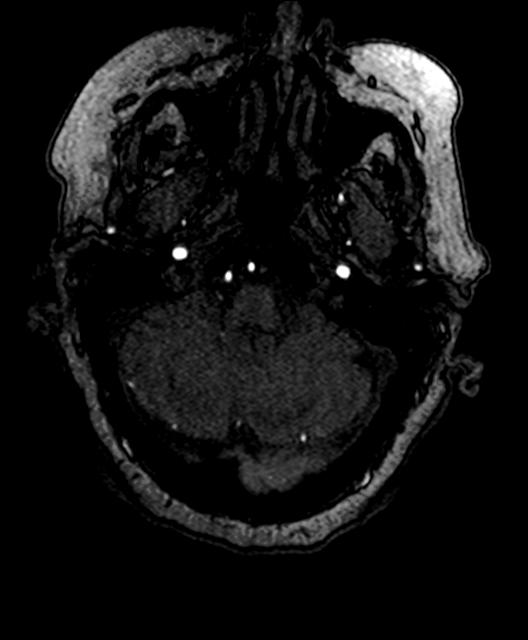
[im 46/143]
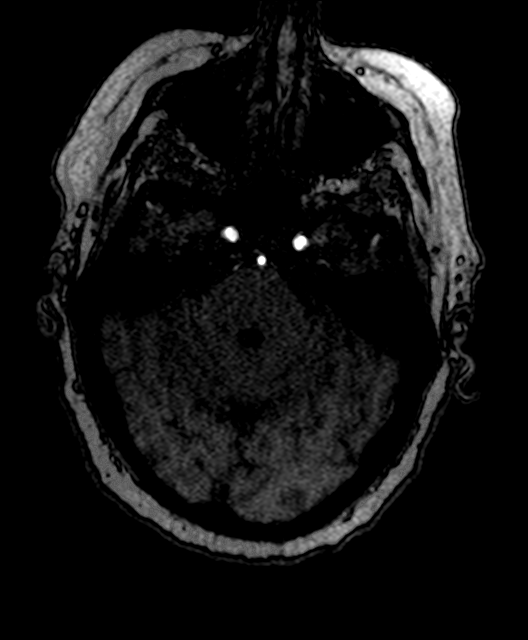
[im 64/143]
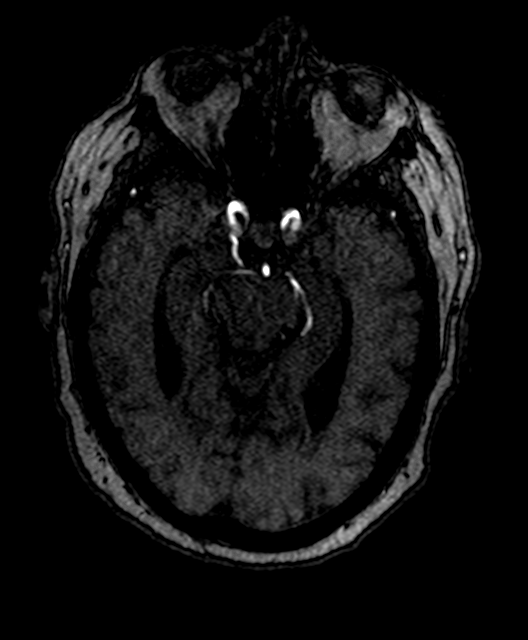
[im 73/143]
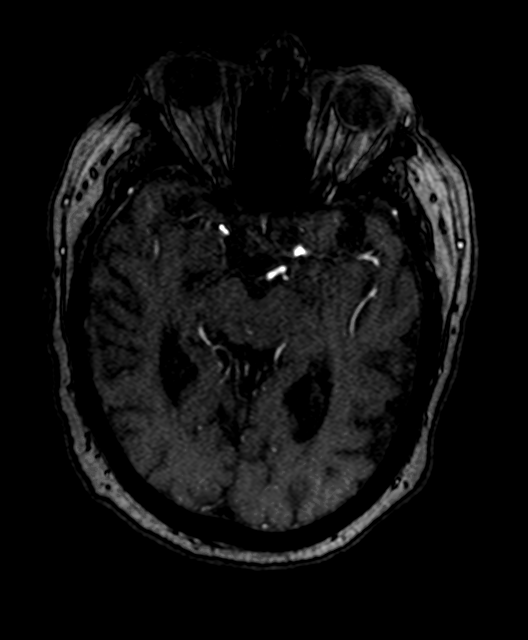
[im 82/143]
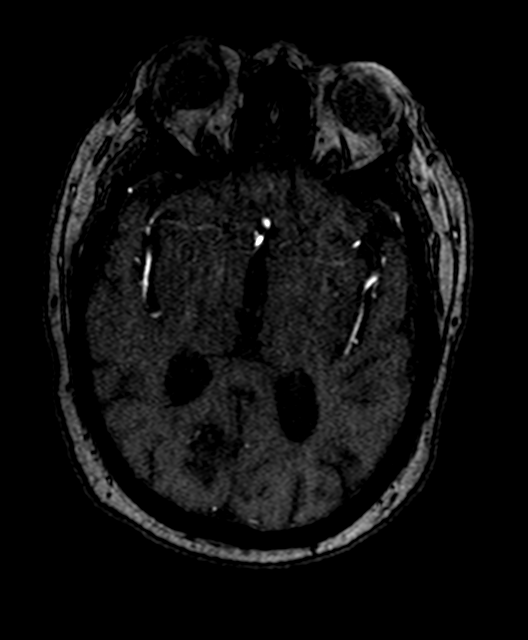
[im 100/143]
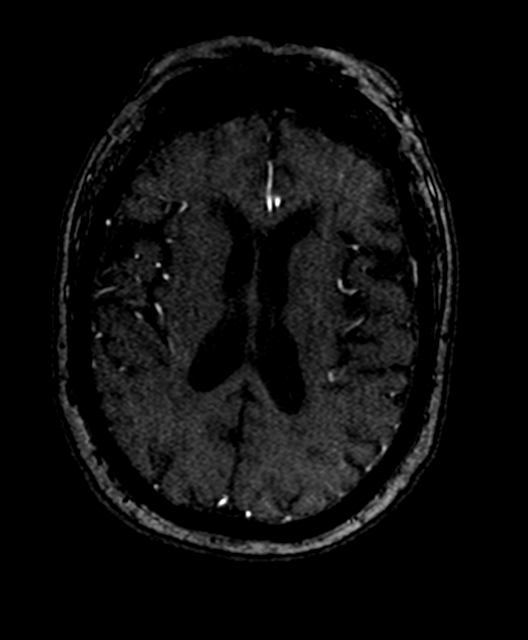
[im 118/143]
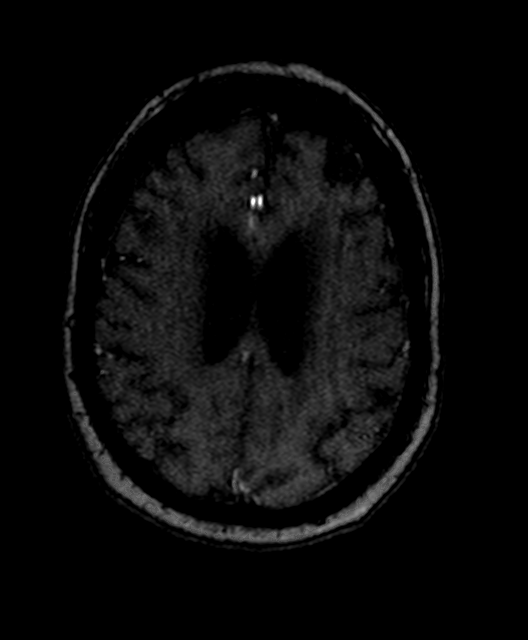
[im 121/143]
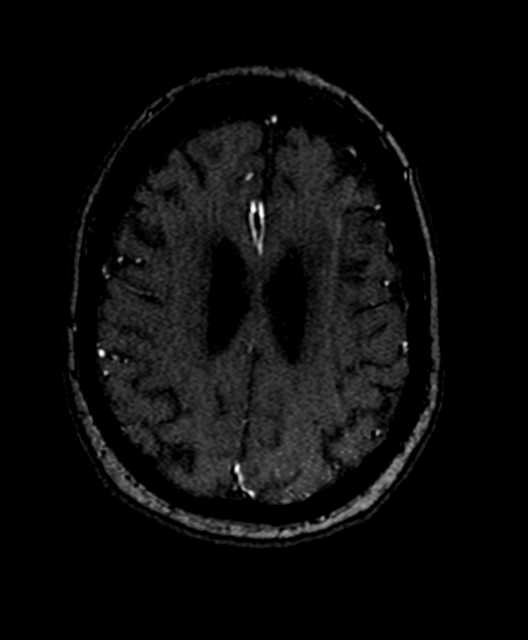
[im 136/143]
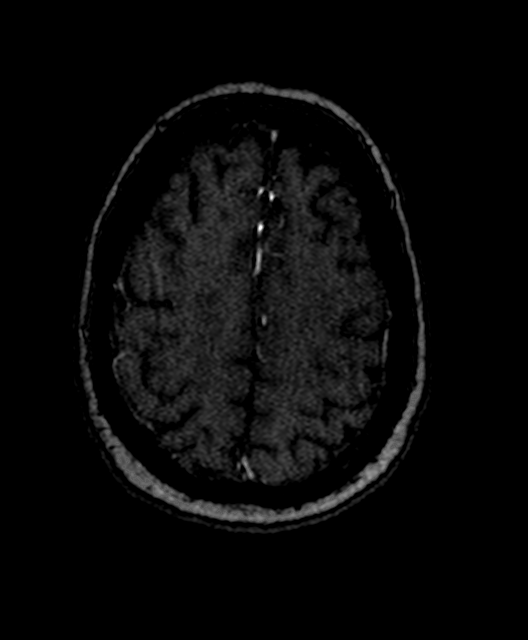

[16 of 48 positions shown; findings below may reference images not displayed]

FINDINGS: Mild atherosclerotic changes are present within the cavernous
internal carotid arteries bilaterally, left greater than right.
There is a high-grade stenosis of the proximal right A1 segment,
unchanged. The anterior communicating artery is patent in both A2
segments fill from the left. The M1 segments are intact. The MCA
bifurcations are within normal limits bilaterally. There is signal
loss suggesting a high-grade stenosis in the proximal inferior right
M2 branch. Moderate distal segmental MCA stenoses are present
bilaterally, left greater than right. No other significant proximal
stenosis is present.

The right vertebral artery is slightly dominant to the left. The
right PICA origin is visualized and normal. The vertebrobasilar
junction is normal. The basilar artery is within normal limits. The
left posterior cerebral artery originates from the basilar tip. The
right posterior cerebral artery is of fetal type. There is
significant proximal signal loss within the right posterior cerebral
artery. Moderate more distal signal loss is present in the left PCA.
IMPRESSION: 1. Stable MRA circle of Willis.
2. High-grade stenosis in the proximal right A1 segment.
3. Moderate diffuse medium and distal small vessel disease within
anterior branch vessels. The left MCA is the worst.
4. Severe signal loss within right posterior cerebral artery, a
fetal type PCA.
5. Moderate signal loss within more distal PCA branch vessels on the
left.

## 2018-08-07 IMAGING — MR MR HEAD W/O CM
9 of 10 series · 38 of 48 positions shown · non-contrast
Comparison: CT head without contrast 08/01/2016 MRI brain
04/04/2016.

CLINICAL DATA: Right facial numbness and abnormal feeling in the
right upper extremity for 1 day. Stroke.

EXAM:
MRI HEAD WITHOUT CONTRAST
TECHNIQUE: Multiplanar, multiecho pulse sequences of the brain and surrounding
structures were obtained without intravenous contrast.

[Series 2: [id number] · sagittal · 5.0mm · 0.40mm/px · 2 of 20 slices shown]
[im 1/20]
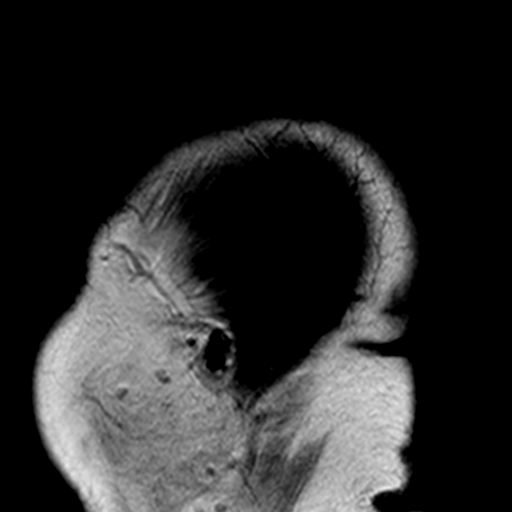
[im 20/20]
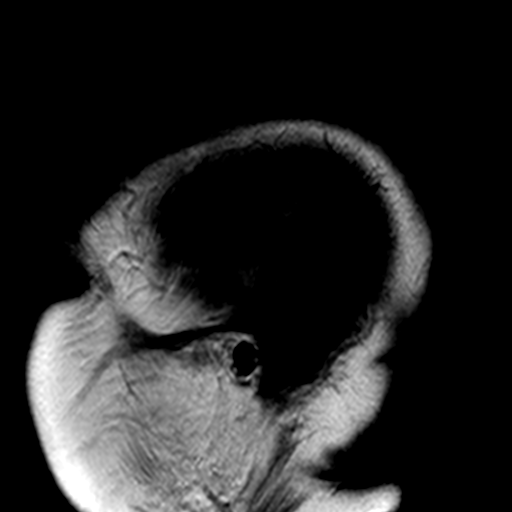

[Series 5: T2 · axial · 5.0mm · 0.69mm/px · z∈[-20,+123]mm · 3 of 23 slices shown (1 of 2)]
[im 1/23]
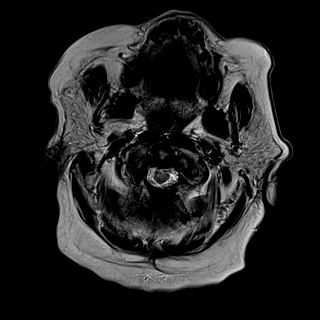
[im 12/23]
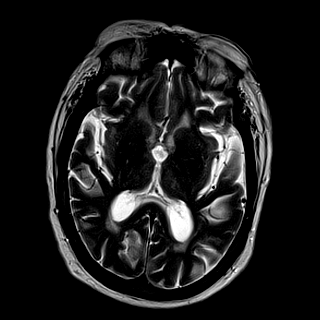
[im 23/23]
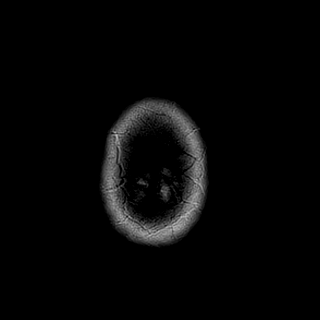

[Series 6: FLAIR · axial · 5.0mm · 0.87mm/px · z∈[-19,+123]mm · 3 of 23 slices shown]
[im 1/23]
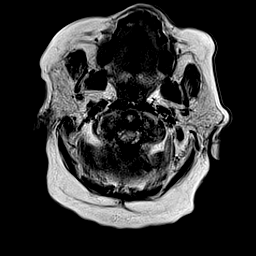
[im 12/23]
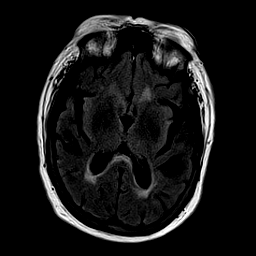
[im 23/23]
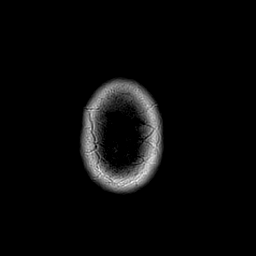

[Series 7: T1 · axial · 2.0mm · 0.43mm/px · z∈[-36,+129]mm · 8 of 84 slices shown]
[im 1/84]
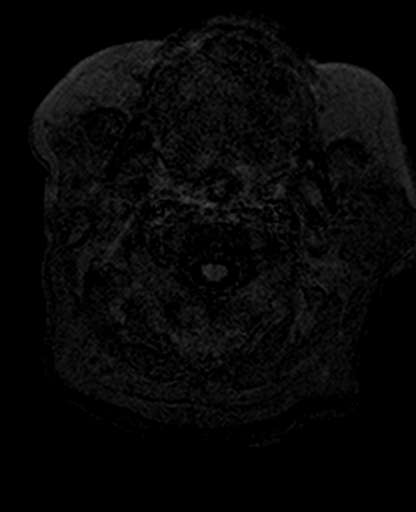
[im 10/84]
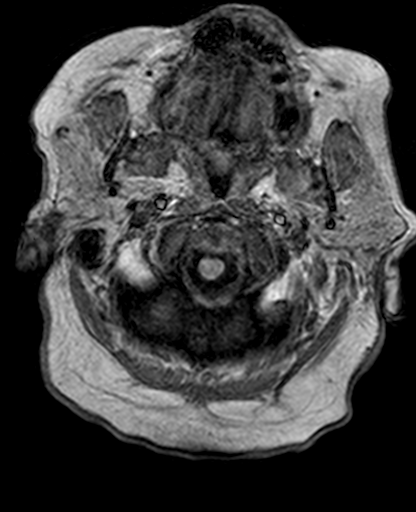
[im 28/84]
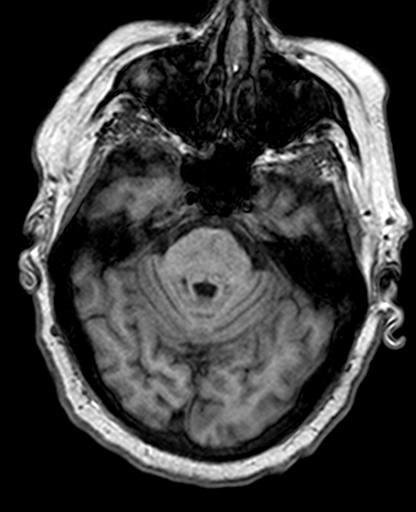
[im 37/84]
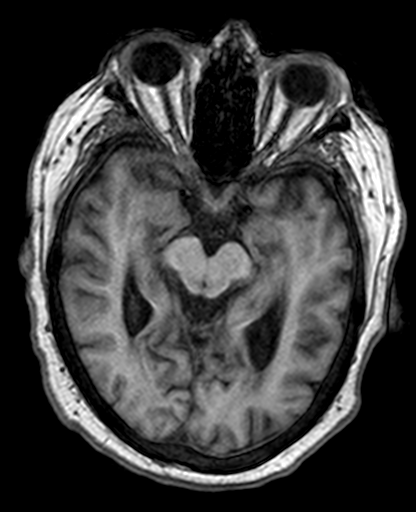
[im 47/84]
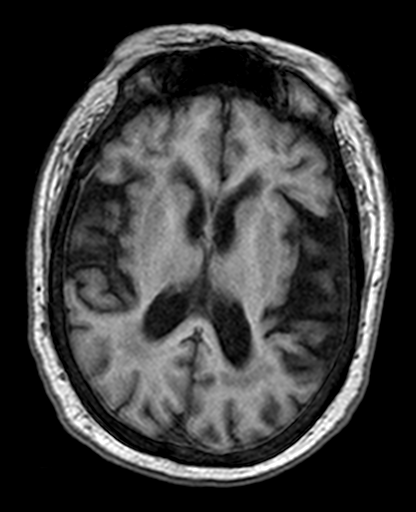
[im 56/84]
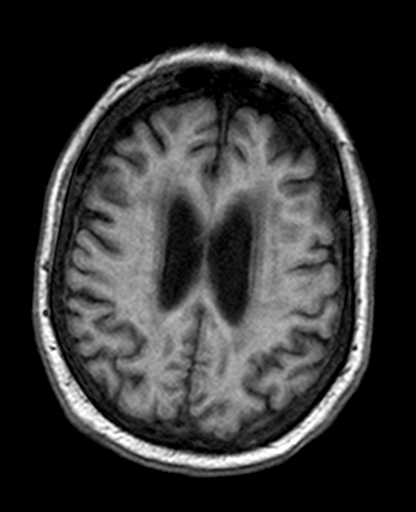
[im 74/84]
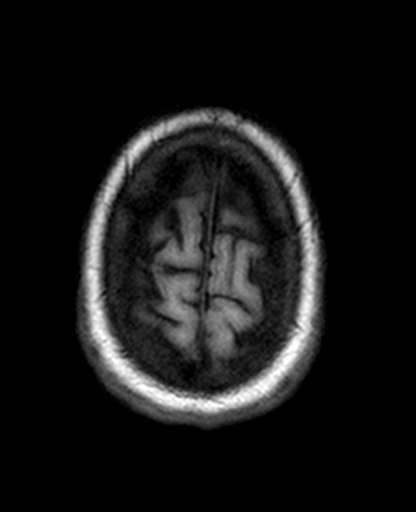
[im 84/84]
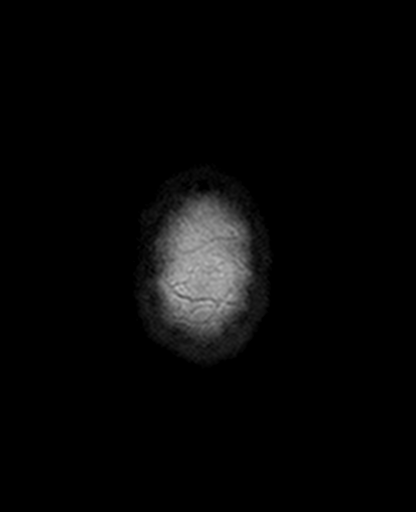

[Series 8: trauma axial · axial · 5.0mm · 0.41mm/px · z∈[-14,+115]mm · 2 of 21 slices shown]
[im 1/21]
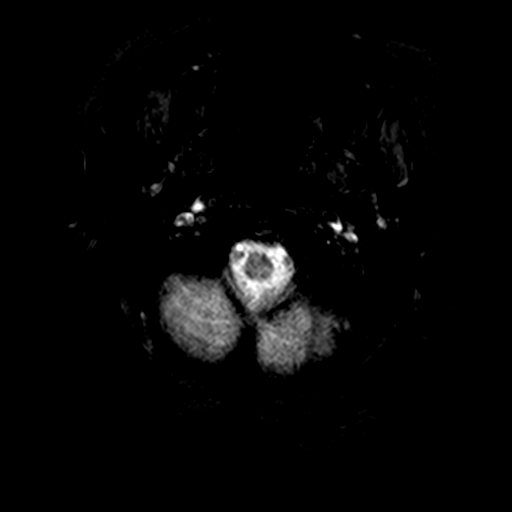
[im 21/21]
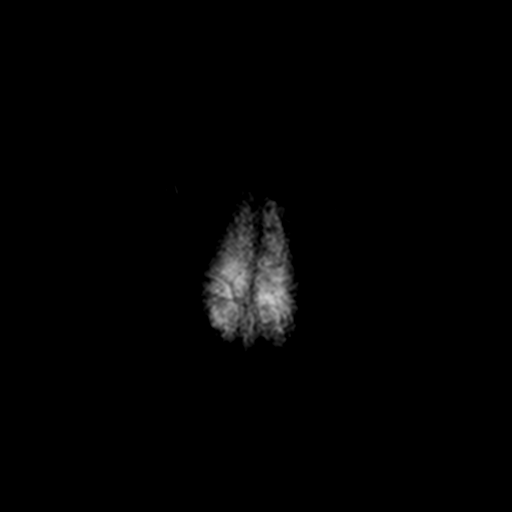

[Series 9: T2 · coronal · 5.0mm · 0.63mm/px · 3 of 28 slices shown (2 of 2)]
[im 1/28]
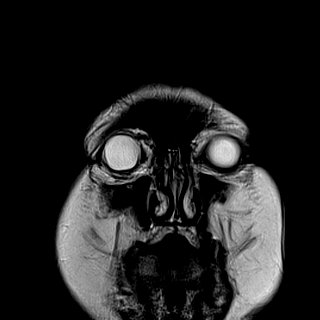
[im 14/28]
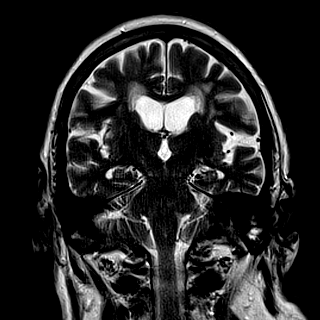
[im 28/28]
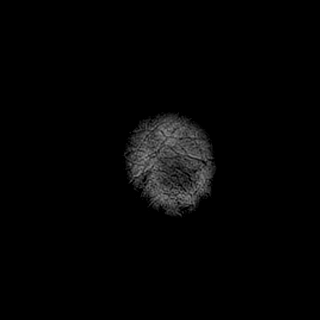

[Series 100: <mpr thick range> · axial · 3.0mm · 0.78mm/px · z∈[-19,+122]mm · 6 of 48 slices shown]
[im 1/48]
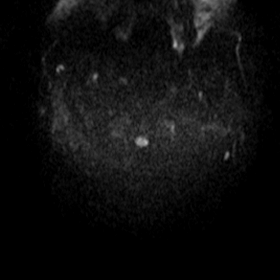
[im 10/48]
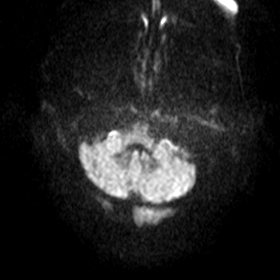
[im 19/48]
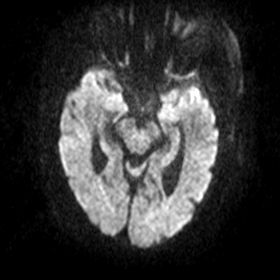
[im 29/48]
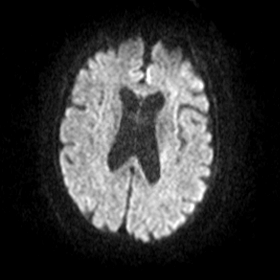
[im 38/48]
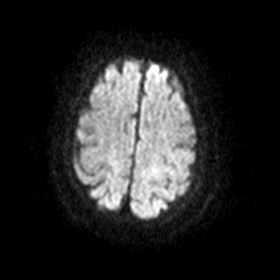
[im 48/48]
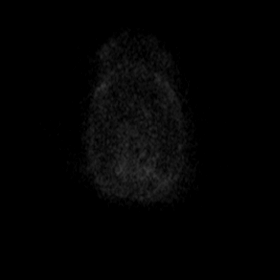

[Series 101: <mpr thick range(1)> · coronal · 3.0mm · 0.63mm/px · 7 of 56 slices shown]
[im 1/56]
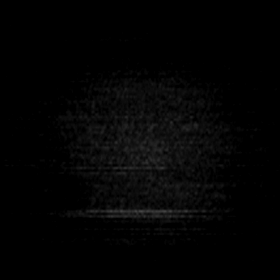
[im 10/56]
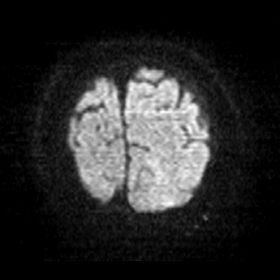
[im 19/56]
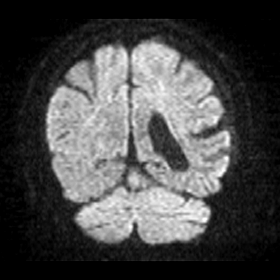
[im 28/56]
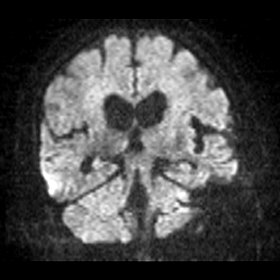
[im 37/56]
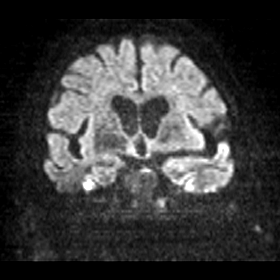
[im 46/56]
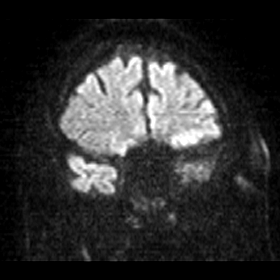
[im 56/56]
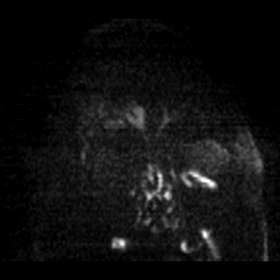

[Series 102: <mpr thick range(2)> · axial · 3.0mm · 0.78mm/px · z∈[-19,+65]mm · 4 of 48 slices shown]
[im 1/48]
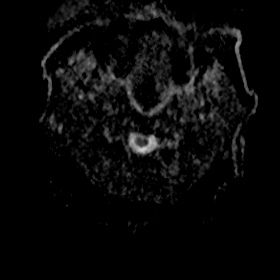
[im 10/48]
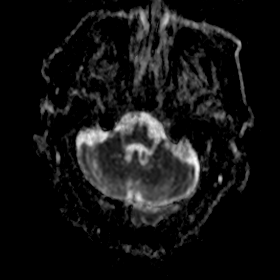
[im 19/48]
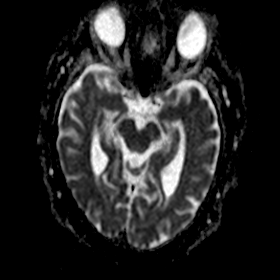
[im 29/48]
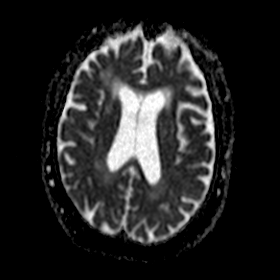

[38 of 48 positions shown; findings below may reference images not displayed]

FINDINGS: A subtle area of diffusion abnormality is present within the left
lateral midbrain. An additional punctate area of restricted
diffusion is evident within the posterior left pons. No acute
cortical infarct is present.

Moderate confluent periventricular and subcortical T2 changes
bilaterally are otherwise stable. With

Internal auditory canals are within normal limits bilaterally. White
matter changes extend into the brainstem. Flow is present in the
major intracranial arteries.

Bilateral lens replacements are present. The globes and orbits are
otherwise intact.

The paranasal sinuses are clear. There is fluid in the mastoid air
cells bilaterally. No obstructing nasopharyngeal lesion is present.
IMPRESSION: 1. Two punctate areas of diffusion abnormality in the brainstem. One
is in the left lateral midbrain. The others in the posterior lateral
left pons. These post correspond with the patient's symptoms.
2. Otherwise stable moderate periventricular and subcortical T2
changes bilaterally with extension in the brainstem. These likely
reflect the sequela of chronic microvascular ischemia.
3. Bilateral mastoid effusions. No obstructing nasopharyngeal lesion
is evident.

## 2018-08-23 ENCOUNTER — Emergency Department (HOSPITAL_COMMUNITY)
Admission: EM | Admit: 2018-08-23 | Discharge: 2018-08-23 | Disposition: A | Discharge status: Home / Self Care | Payer: Medicare HMO | Attending: Emergency Medicine | Admitting: Emergency Medicine

## 2018-08-23 ENCOUNTER — Other Ambulatory Visit: Payer: Self-pay

## 2018-08-23 ENCOUNTER — Encounter (HOSPITAL_COMMUNITY): Payer: Self-pay | Admitting: *Deleted

## 2018-08-23 ENCOUNTER — Emergency Department (HOSPITAL_COMMUNITY): Payer: Medicare HMO

## 2018-08-23 DIAGNOSIS — E119 Type 2 diabetes mellitus without complications: Secondary | ICD-10-CM | POA: Diagnosis not present

## 2018-08-23 DIAGNOSIS — Y999 Unspecified external cause status: Secondary | ICD-10-CM | POA: Insufficient documentation

## 2018-08-23 DIAGNOSIS — Y939 Activity, unspecified: Secondary | ICD-10-CM | POA: Diagnosis not present

## 2018-08-23 DIAGNOSIS — S0081XA Abrasion of other part of head, initial encounter: Secondary | ICD-10-CM | POA: Diagnosis not present

## 2018-08-23 DIAGNOSIS — E039 Hypothyroidism, unspecified: Secondary | ICD-10-CM | POA: Diagnosis not present

## 2018-08-23 DIAGNOSIS — Z794 Long term (current) use of insulin: Secondary | ICD-10-CM | POA: Diagnosis not present

## 2018-08-23 DIAGNOSIS — I5030 Unspecified diastolic (congestive) heart failure: Secondary | ICD-10-CM | POA: Insufficient documentation

## 2018-08-23 DIAGNOSIS — Y92009 Unspecified place in unspecified non-institutional (private) residence as the place of occurrence of the external cause: Secondary | ICD-10-CM

## 2018-08-23 DIAGNOSIS — Z8673 Personal history of transient ischemic attack (TIA), and cerebral infarction without residual deficits: Secondary | ICD-10-CM | POA: Diagnosis not present

## 2018-08-23 DIAGNOSIS — S0083XA Contusion of other part of head, initial encounter: Secondary | ICD-10-CM

## 2018-08-23 DIAGNOSIS — R51 Headache: Secondary | ICD-10-CM | POA: Insufficient documentation

## 2018-08-23 DIAGNOSIS — Z23 Encounter for immunization: Secondary | ICD-10-CM | POA: Insufficient documentation

## 2018-08-23 DIAGNOSIS — Z7982 Long term (current) use of aspirin: Secondary | ICD-10-CM | POA: Diagnosis not present

## 2018-08-23 DIAGNOSIS — W06XXXA Fall from bed, initial encounter: Secondary | ICD-10-CM | POA: Diagnosis not present

## 2018-08-23 DIAGNOSIS — I110 Hypertensive heart disease with heart failure: Secondary | ICD-10-CM | POA: Diagnosis not present

## 2018-08-23 DIAGNOSIS — Y92013 Bedroom of single-family (private) house as the place of occurrence of the external cause: Secondary | ICD-10-CM | POA: Insufficient documentation

## 2018-08-23 DIAGNOSIS — Z951 Presence of aortocoronary bypass graft: Secondary | ICD-10-CM | POA: Diagnosis not present

## 2018-08-23 DIAGNOSIS — J441 Chronic obstructive pulmonary disease with (acute) exacerbation: Secondary | ICD-10-CM | POA: Insufficient documentation

## 2018-08-23 DIAGNOSIS — Z79899 Other long term (current) drug therapy: Secondary | ICD-10-CM | POA: Diagnosis not present

## 2018-08-23 DIAGNOSIS — I2510 Atherosclerotic heart disease of native coronary artery without angina pectoris: Secondary | ICD-10-CM | POA: Insufficient documentation

## 2018-08-23 DIAGNOSIS — S0990XA Unspecified injury of head, initial encounter: Secondary | ICD-10-CM | POA: Diagnosis present

## 2018-08-23 DIAGNOSIS — W19XXXA Unspecified fall, initial encounter: Secondary | ICD-10-CM

## 2018-08-23 DIAGNOSIS — J209 Acute bronchitis, unspecified: Secondary | ICD-10-CM

## 2018-08-23 DIAGNOSIS — S5011XA Contusion of right forearm, initial encounter: Secondary | ICD-10-CM | POA: Insufficient documentation

## 2018-08-23 MED ORDER — ALBUTEROL SULFATE (2.5 MG/3ML) 0.083% IN NEBU
5.0000 mg | INHALATION_SOLUTION | Freq: Once | RESPIRATORY_TRACT | Status: AC
  Administered 2018-08-23: 5 mg via RESPIRATORY_TRACT
  Filled 2018-08-23: qty 6

## 2018-08-23 MED ORDER — TETANUS-DIPHTH-ACELL PERTUSSIS 5-2.5-18.5 LF-MCG/0.5 IM SUSP
0.5000 mL | Freq: Once | INTRAMUSCULAR | Status: AC
  Administered 2018-08-23: 0.5 mL via INTRAMUSCULAR
  Filled 2018-08-23: qty 0.5

## 2018-08-23 MED ORDER — ACETAMINOPHEN 325 MG PO TABS
650.0000 mg | ORAL_TABLET | Freq: Once | ORAL | Status: AC
  Administered 2018-08-23: 650 mg via ORAL
  Filled 2018-08-23: qty 2

## 2018-08-23 NOTE — ED Notes (Signed)
Call [phone] (grandson Sammy) for updates

## 2018-08-23 NOTE — Discharge Instructions (Addendum)
Use ice packs over the injured areas for comfort and to help with swelling. You can take acetaminophen for pain if needed. Continue the medications Dr Janna Arch started you on and use your nebulizer for wheezing or shortness of breath.  Return to the ED if you have any problems listed on the head injury sheet or if your breathing gets worse and you have decided you would like to be admitted.

## 2018-08-23 NOTE — ED Triage Notes (Signed)
Pt was trying to get into her bed when she slid causing her to fall, pt states that she hit her head on the bedside table, has abrasion noted to right upper forehead area, abrasions noted to right forearm area, denies any LOC, denies any neck pain,

## 2018-08-23 NOTE — ED Provider Notes (Signed)
[facility] EMERGENCY DEPARTMENT Provider Note   CSN: [id number] Arrival date & time: 08/23/18  0255  Time seen 04:05 AM   History   Chief Complaint Chief Complaint  Patient presents with  . Fall    HPI [name] [name] is a 82 y.o. female.  HPI patient reports about 3 AM she sat on the side of her bed getting ready to go to bed and she slid off.  She states she hit the right side of her head on a bedside table.  She denies loss of consciousness.  She states she landed on her right hip however she has no pain in her hip at all now.  She complains of a lot of headache.  She is not on blood thinners but she does take an aspirin a day.  She also complains of some bruising on her right forearm.  She states that she was bleeding heavily at home.  She denies nausea, vomiting, change in vision, numbness or tingling of extremities, or neck pain.  She does not know when her last tetanus injection was.   Patient states she was seen on Friday, August 30 by her primary care doctor for bronchitis.  She states he wanted to have her admitted to the hospital but she refused.  She states he started her on medication but "I am not better yet".  She states she is using her albuterol nebulizers but they make her feel shaky.  PCP Oval Linsey, MD   Past Medical History:  Diagnosis Date  . Arthritis   . CAD (coronary artery disease)    Multivessel status post CABG 2001  . Chronic obstructive pulmonary disease (HCC)   . DDD (degenerative disc disease), lumbar   . Diastolic CHF (HCC)   . Essential hypertension   . Hematuria   . History of CVA (cerebrovascular accident)    2013 - lacunar infarct right thalamus - residual left side of  lip numb  and left eye vision worse (which corrented lens implant from cataract extraction)  . History of MI (myocardial infarction)    July 2001  . Hyperlipidemia   . Hypothyroidism   . Insulin dependent type 2 diabetes mellitus (HCC)   . Lesion of bladder   .  Lower urinary tract symptoms (LUTS)   . OSA (obstructive sleep apnea)   . Peripheral neuropathy   . Psoriasis   . SUI (stress urinary incontinence, female)   . Varicose veins     Patient Active Problem List   Diagnosis Date Noted  . Syncope   . Loss of consciousness (HCC) 06/10/2018  . Hyperglycemia 06/10/2018  . Bradycardia, sinus 06/10/2018  . Diastolic dysfunction 09/29/2017  . CAD (coronary artery disease) 09/28/2017  . Acute lower UTI 09/28/2017  . Ischemic stroke (HCC) 05/10/2017  . Acute ischemic stroke (HCC) 05/10/2017  . Stroke (HCC) 08/01/2016  . Dysarthria 03/18/2016  . Anemia of chronic disease 11/19/2015  . OSA (obstructive sleep apnea) 11/19/2015  . Left facial numbness 08/29/2015  . Gait instability 06/26/2015  . COPD exacerbation (HCC) 11/13/2014  . Weakness generalized 12/10/2013  . TIA (transient ischemic attack) 06/22/2012  . Left-sided headache 06/22/2012  . Noncompliance 06/22/2012  . Noncompliance with CPAP treatment 06/22/2012  . Psoriasis 01/07/2012  . Cerebrovascular disease   . Chronic back pain   . Chronic obstructive pulmonary disease (HCC)   . DM type 2 causing complication (HCC) 02/14/2010  . Obesity 02/14/2010  . Arteriosclerotic cardiovascular disease (ASCVD) 02/14/2010  . Hypothyroidism 02/06/2010  .  Hyperlipidemia 02/06/2010  . Essential hypertension 02/06/2010  . Sleep apnea 02/06/2010    Past Surgical History:  Procedure Laterality Date  . APPENDECTOMY  age 32  . CARDIAC CATHETERIZATION  1991   No sig. cad  . CARDIAC CATHETERIZATION  07-01-2000   high grade in-stent restenosis  . CARDIAC CATHETERIZATION  04-30-2001   single native vessel cad/ graft patent  . CARDIAC CATHETERIZATION  02-13-2006  dr [place] cad with total occluded LAD  and  70% ostial of small ramus/  grafts patent/  normal ef  . CARDIAC CATHETERIZATION  01-20-2000  dr Clifton James   essentially unchanged;  severe single vessel cad with diffuse disease  throughout CFX and RCA/ ef 66%;  chf with preserved LVSF  . CARDIOVASCULAR STRESS TEST  08-28-2011   DR ROTHBART   NEGATIVE NUCLEAR STUDY/  GLOBAL LVSF/  EF 65%  . CARPAL TUNNEL RELEASE Bilateral yrs ago  . CATARACT EXTRACTION W/ INTRAOCULAR LENS  IMPLANT, BILATERAL  2013  . CHOLECYSTECTOMY  1990  . COLONOSCOPY N/A 07/26/2015   Procedure: COLONOSCOPY;  Surgeon: Malissa Hippo, MD;  Location: [facility];  Service: Endoscopy;  Laterality: N/A;  1200  . CORONARY ANGIOPLASTY  11-13-1999   balloon angioplasty to mLAD , d2 of LAD  . CORONARY ANGIOPLASTY WITH STENT PLACEMENT  01-17-2000   PCI stenting to mLAD & D2 of LAD  . CORONARY ARTERY BYPASS GRAFT  07-02-2000   DR Tressie Stalker   SVG to diagonal, LIMA to LAD  . CYSTOSCOPY W/ RETROGRADES Bilateral 08/09/2014   Procedure: CYSTOSCOPY, BILATERAL RETROGRADE, HYDRODISTENSION, BLADDER BIOPSY WITH FULGERATION, INSTILL PYRIDIUM AND MARCAINE ;  Surgeon: Anner Crete, MD;  Location: [facility];  Service: Urology;  Laterality: Bilateral;  . KNEE ARTHROSCOPY Left 10-18-2003   SYNOVECTOMY  . LUMBAR SPINE SURGERY  x2    yrs ago  . ORIF HUMERUS FRACTURE Right 04-18-2008  . REPAIR FLEXOR TENDON HAND Right 09-20-2008   CARPI RADIALIS TENDON TRANSFER TO EXTENSOR TO INDEX, MIDDLE, RING , LITTLE FINGERS AND DIGITI MININI  . ROTATOR CUFF REPAIR Right 1995  . TOTAL ABDOMINAL HYSTERECTOMY W/ BILATERAL SALPINGOOPHORECTOMY  age 88  . TRANSTHORACIC ECHOCARDIOGRAM  12-12-2013   MILD LVH/  EF 60-65%/  GRADE I DIASTOLIC DYSFUNCTION/  MILD LAE     OB History    Gravida  7   Para  7   Term  7   Preterm      AB      Living  4     SAB      TAB      Ectopic      Multiple      Live Births               Home Medications    Prior to Admission medications   Medication Sig Start Date End Date Taking? Authorizing Provider  amLODipine (NORVASC) 5 MG tablet Take 5 mg by mouth daily. 07/03/17   [provider]  aspirin EC  325 MG tablet Take 325 mg by mouth daily.    [provider]  cephALEXin (KEFLEX) 500 MG capsule Take 1 capsule (500 mg total) by mouth 3 (three) times daily. 06/12/18   Oval Linsey, MD  gabapentin (NEURONTIN) 300 MG capsule Take 300 mg by mouth 2 (two) times daily. 09/18/17   [provider]  HYDROcodone-acetaminophen (NORCO/VICODIN) 5-325 MG tablet Take 1 tablet by mouth every 4 (four) hours as needed. 06/29/18   Adriana Simas,  Arlys John, MD  insulin NPH-regular Human (NOVOLIN 70/30) (70-30) 100 UNIT/ML injection Inject 40 Units into the skin 2 (two) times daily.    [provider]  ipratropium-albuterol (DUONEB) 0.5-2.5 (3) MG/3ML SOLN Inhale 3 mLs into the lungs 4 (four) times daily as needed. 09/19/17   [provider]  levothyroxine (SYNTHROID, LEVOTHROID) 25 MCG tablet Take 25 mcg by mouth daily.     [provider]  lisinopril (PRINIVIL,ZESTRIL) 10 MG tablet Take 10 mg by mouth daily.    [provider]  metFORMIN (GLUCOPHAGE) 500 MG tablet Take 500 mg by mouth 2 (two) times daily with a meal.      [provider]  nitroGLYCERIN (NITROSTAT) 0.4 MG SL tablet Place 0.4 mg under the tongue every 5 (five) minutes as needed for chest pain.     [provider]  oxyCODONE (OXY IR/ROXICODONE) 5 MG immediate release tablet Take 1 tablet (5 mg total) by mouth every 4 (four) hours as needed (Moderate pain). 06/12/18   Oval Linsey, MD  triamcinolone cream (KENALOG) 0.1 % APPLY TO AFFECTED AREA TWICE A DAY AS DIRECTED FOR FLARES 05/04/18   [provider]    Family History Family History  Problem Relation Age of Onset  . Hypertension Mother   . Stroke Mother   . Coronary artery disease Unknown        Multiple first and second-degree relatives  . Aneurysm Unknown        Cerebral circulation  . Colon cancer Sister   . Stroke Sister   . Coronary artery disease Sister   . Clotting disorder Unknown        Children diagnosed  with hypercoagulable state  . Coronary artery disease Brother     Social History Social History   Tobacco Use  . Smoking status: Never Smoker  . Smokeless tobacco: Never Used  Substance Use Topics  . Alcohol use: No  . Drug use: No  lives at home    Allergies   Codeine   Review of Systems Review of Systems  All other systems reviewed and are negative.    Physical Exam Updated Vital Signs BP (!) 171/72 (BP Location: Right Arm)   Pulse 71   Temp 98.1 F (36.7 C) (Oral)   Resp 20   Ht 5\' 4"  (1.626 m)   Wt 81.6 kg   SpO2 97%   BMI 30.90 kg/m   Vital signs normal except for hypertension   Physical Exam  Constitutional: She is oriented to person, place, and time. She appears well-developed and well-nourished.  Non-toxic appearance. She does not appear ill. No distress.  HENT:  Head: Normocephalic.  Right Ear: External ear normal.  Left Ear: External ear normal.  Nose: Nose normal. No mucosal edema or rhinorrhea.  Mouth/Throat: Oropharynx is clear and moist and mucous membranes are normal. No dental abscesses or uvula swelling.  Patient has a linear abrasion on her right forehead that is  2.5 cm in length.  There is some small amount of swelling underneath it.  Eyes: Pupils are equal, round, and reactive to light. Conjunctivae and EOM are normal.  Neck: Normal range of motion and full passive range of motion without pain. Neck supple.  Cardiovascular: Normal rate, regular rhythm and normal heart sounds. Exam reveals no gallop and no friction rub.  No murmur heard. Pulmonary/Chest: Effort normal. No respiratory distress. She has wheezes. She has no rhonchi. She has no rales. She exhibits no tenderness and no crepitus.  Sometimes  audible wheezing, she is coughing at times  Abdominal: Soft. Normal appearance and bowel sounds are normal. She exhibits no distension. There is no tenderness. There is no rebound and no guarding.  Musculoskeletal: Normal range of motion.  She exhibits no edema or tenderness.  Moves all extremities well.  Patient has good range of motion of  her right lower leg without any pain in her hip.  On exam of her right upper extremity she is noted to have bruising around her right wrist on the radial aspect and on her proximal right forearm.  She has no pain on flexion extension of her elbow or supination and pronation.  Neurological: She is alert and oriented to person, place, and time. She has normal strength. No cranial nerve deficit.  Skin: Skin is warm, dry and intact. No rash noted. No erythema. No pallor.  Psychiatric: She has a normal mood and affect. Her speech is normal and behavior is normal. Her mood appears not anxious.  Nursing note and vitals reviewed.        ED Treatments / Results  Labs (all labs ordered are listed, but only abnormal results are displayed) Labs Reviewed - No data to display  EKG None  Radiology Ct Head Wo Contrast  Result Date: 08/23/2018 CLINICAL DATA:  Seven 18-year-old female with head trauma. EXAM: CT HEAD WITHOUT CONTRAST TECHNIQUE: Contiguous axial images were obtained from the base of the skull through the vertex without intravenous contrast. COMPARISON:  Head CT dated 06/10/2018 FINDINGS: Brain: There is mild age-related atrophy and chronic microvascular ischemic changes. No acute intracranial hemorrhage. Mass effect or midline shift. No extra-axial fluid collection. Vascular: No hyperdense vessel or unexpected calcification. Skull: Normal. Negative for fracture or focal lesion. Sinuses/Orbits: The visualized paranasal sinuses and the right mastoid air cells are clear. Mild left mastoid effusions. Other: Mild right temporal scalp contusion. IMPRESSION: 1. No acute intracranial hemorrhage. 2. Age-related atrophy and chronic microvascular ischemic changes. Electronically Signed   By: Elgie Collard M.D.   On: 08/23/2018 05:01    Procedures Procedures (including critical care  time)  Medications Ordered in ED Medications  albuterol (PROVENTIL) (2.5 MG/3ML) 0.083% nebulizer solution 5 mg (has no administration in time range)  Tdap (BOOSTRIX) injection 0.5 mL (0.5 mLs Intramuscular Given 08/23/18 0427)  acetaminophen (TYLENOL) tablet 650 mg (650 mg Oral Given 08/23/18 0426)     Initial Impression / Assessment and Plan / ED Course  I have reviewed the triage vital signs and the nursing notes.  Pertinent labs & imaging results that were available during my care of the patient were reviewed by me and considered in my medical decision making (see chart for details).     Patient was given acetaminophen for headache.  Tetanus booster was updated.  CT of her head was done.  I did not do any x-rays of her extremity since they were not painful.  She did finally agreed to get an albuterol nebulizer for her wheezing.  Recheck at 5:15 AM I gave patient her CT results.  She has not had her nebulizer treatment yet however once that is done she will be discharged.  Final Clinical Impressions(s) / ED Diagnoses   Final diagnoses:  Fall in home, initial encounter  Abrasion of forehead, initial encounter  Contusion of other part of head, initial encounter  Bronchitis with bronchospasm    ED Discharge Orders    None    OTC acetaminophen  Plan discharge  Devoria Albe, MD, [facility]ando Gang  Devoria Albe, MD 08/23/18 [phone]

## 2018-08-27 ENCOUNTER — Encounter (HOSPITAL_COMMUNITY): Payer: Self-pay | Admitting: Emergency Medicine

## 2018-08-27 ENCOUNTER — Observation Stay (HOSPITAL_COMMUNITY)
Admission: EM | Admit: 2018-08-27 | Discharge: 2018-09-01 | Disposition: A | Discharge status: Home / Self Care | Payer: Medicare HMO | Attending: Family Medicine | Admitting: Family Medicine

## 2018-08-27 ENCOUNTER — Emergency Department (HOSPITAL_COMMUNITY): Payer: Medicare HMO

## 2018-08-27 ENCOUNTER — Other Ambulatory Visit: Payer: Self-pay

## 2018-08-27 DIAGNOSIS — I5030 Unspecified diastolic (congestive) heart failure: Secondary | ICD-10-CM | POA: Insufficient documentation

## 2018-08-27 DIAGNOSIS — Z794 Long term (current) use of insulin: Secondary | ICD-10-CM | POA: Insufficient documentation

## 2018-08-27 DIAGNOSIS — R2681 Unsteadiness on feet: Secondary | ICD-10-CM

## 2018-08-27 DIAGNOSIS — I639 Cerebral infarction, unspecified: Secondary | ICD-10-CM | POA: Diagnosis not present

## 2018-08-27 DIAGNOSIS — N390 Urinary tract infection, site not specified: Secondary | ICD-10-CM | POA: Diagnosis present

## 2018-08-27 DIAGNOSIS — E118 Type 2 diabetes mellitus with unspecified complications: Secondary | ICD-10-CM | POA: Diagnosis present

## 2018-08-27 DIAGNOSIS — I10 Essential (primary) hypertension: Secondary | ICD-10-CM | POA: Diagnosis present

## 2018-08-27 DIAGNOSIS — I2510 Atherosclerotic heart disease of native coronary artery without angina pectoris: Secondary | ICD-10-CM | POA: Diagnosis not present

## 2018-08-27 DIAGNOSIS — R531 Weakness: Secondary | ICD-10-CM

## 2018-08-27 DIAGNOSIS — G8929 Other chronic pain: Secondary | ICD-10-CM | POA: Diagnosis not present

## 2018-08-27 DIAGNOSIS — I110 Hypertensive heart disease with heart failure: Secondary | ICD-10-CM | POA: Insufficient documentation

## 2018-08-27 DIAGNOSIS — Z9114 Patient's other noncompliance with medication regimen: Secondary | ICD-10-CM

## 2018-08-27 DIAGNOSIS — Z8673 Personal history of transient ischemic attack (TIA), and cerebral infarction without residual deficits: Secondary | ICD-10-CM | POA: Diagnosis not present

## 2018-08-27 DIAGNOSIS — J449 Chronic obstructive pulmonary disease, unspecified: Secondary | ICD-10-CM | POA: Insufficient documentation

## 2018-08-27 DIAGNOSIS — M549 Dorsalgia, unspecified: Secondary | ICD-10-CM | POA: Insufficient documentation

## 2018-08-27 DIAGNOSIS — I679 Cerebrovascular disease, unspecified: Secondary | ICD-10-CM

## 2018-08-27 DIAGNOSIS — Z7982 Long term (current) use of aspirin: Secondary | ICD-10-CM | POA: Diagnosis not present

## 2018-08-27 DIAGNOSIS — L409 Psoriasis, unspecified: Secondary | ICD-10-CM

## 2018-08-27 DIAGNOSIS — G4733 Obstructive sleep apnea (adult) (pediatric): Secondary | ICD-10-CM

## 2018-08-27 DIAGNOSIS — E039 Hypothyroidism, unspecified: Secondary | ICD-10-CM | POA: Diagnosis not present

## 2018-08-27 DIAGNOSIS — Z79899 Other long term (current) drug therapy: Secondary | ICD-10-CM | POA: Insufficient documentation

## 2018-08-27 DIAGNOSIS — M5440 Lumbago with sciatica, unspecified side: Secondary | ICD-10-CM

## 2018-08-27 DIAGNOSIS — J441 Chronic obstructive pulmonary disease with (acute) exacerbation: Secondary | ICD-10-CM

## 2018-08-27 DIAGNOSIS — E7801 Familial hypercholesterolemia: Secondary | ICD-10-CM

## 2018-08-27 DIAGNOSIS — J410 Simple chronic bronchitis: Secondary | ICD-10-CM

## 2018-08-27 DIAGNOSIS — E119 Type 2 diabetes mellitus without complications: Secondary | ICD-10-CM | POA: Diagnosis not present

## 2018-08-27 DIAGNOSIS — E02 Subclinical iodine-deficiency hypothyroidism: Secondary | ICD-10-CM

## 2018-08-27 DIAGNOSIS — R29810 Facial weakness: Secondary | ICD-10-CM | POA: Diagnosis present

## 2018-08-27 LAB — CBC
HCT: 41.2 % (ref 36.0–46.0)
HEMOGLOBIN: 12.8 g/dL (ref 12.0–15.0)
MCH: 25.5 pg — ABNORMAL LOW (ref 26.0–34.0)
MCHC: 31.1 g/dL (ref 30.0–36.0)
MCV: 82.2 fL (ref 78.0–100.0)
PLATELETS: 281 10*3/uL (ref 150–400)
RBC: 5.01 MIL/uL (ref 3.87–5.11)
RDW: 15.2 % (ref 11.5–15.5)
WBC: 13.6 10*3/uL — AB (ref 4.0–10.5)

## 2018-08-27 LAB — COMPREHENSIVE METABOLIC PANEL
ALBUMIN: 3.4 g/dL — AB (ref 3.5–5.0)
ALT: 9 U/L (ref 0–44)
AST: 10 U/L — AB (ref 15–41)
Alkaline Phosphatase: 81 U/L (ref 38–126)
Anion gap: 5 (ref 5–15)
BUN: 16 mg/dL (ref 8–23)
CO2: 30 mmol/L (ref 22–32)
Calcium: 8.9 mg/dL (ref 8.9–10.3)
Chloride: 105 mmol/L (ref 98–111)
Creatinine, Ser: 0.73 mg/dL (ref 0.44–1.00)
GFR calc Af Amer: >60 mL/min (ref >60)
Glucose, Bld: 144 mg/dL — ABNORMAL HIGH (ref 70–99)
POTASSIUM: 3.8 mmol/L (ref 3.5–5.1)
Sodium: 140 mmol/L (ref 135–145)
Total Bilirubin: 0.4 mg/dL (ref 0.3–1.2)
Total Protein: 6.5 g/dL (ref 6.5–8.1)

## 2018-08-27 LAB — DIFFERENTIAL
Basophils Absolute: 0 10*3/uL (ref 0.0–0.1)
Basophils Relative: 0 %
Eosinophils Absolute: 0.5 10*3/uL (ref 0.0–0.7)
Eosinophils Relative: 4 %
LYMPHS PCT: 23 %
Lymphs Abs: 3.1 10*3/uL (ref 0.7–4.0)
Monocytes Absolute: 1.4 10*3/uL — ABNORMAL HIGH (ref 0.1–1.0)
Monocytes Relative: 10 %
NEUTROS ABS: 8.5 10*3/uL — AB (ref 1.7–7.7)
NEUTROS PCT: 63 %

## 2018-08-27 LAB — PROTIME-INR
INR: 1
Prothrombin Time: 13.1 seconds (ref 11.4–15.2)

## 2018-08-27 LAB — I-STAT TROPONIN, ED: TROPONIN I, POC: 0.01 ng/mL (ref 0.00–0.08)

## 2018-08-27 LAB — I-STAT CHEM 8, ED
BUN: 15 mg/dL (ref 8–23)
Calcium, Ion: 1.12 mmol/L — ABNORMAL LOW (ref 1.15–1.40)
Chloride: 103 mmol/L (ref 98–111)
Creatinine, Ser: 0.8 mg/dL (ref 0.44–1.00)
Glucose, Bld: 138 mg/dL — ABNORMAL HIGH (ref 70–99)
HCT: 39 % (ref 36.0–46.0)
HEMOGLOBIN: 13.3 g/dL (ref 12.0–15.0)
POTASSIUM: 3.7 mmol/L (ref 3.5–5.1)
SODIUM: 140 mmol/L (ref 135–145)
TCO2: 25 mmol/L (ref 22–32)

## 2018-08-27 LAB — APTT: aPTT: 29 seconds (ref 24–36)

## 2018-08-27 LAB — ETHANOL: Alcohol, Ethyl (B): <10 mg/dL (ref <10)

## 2018-08-27 LAB — CBG MONITORING, ED: Glucose-Capillary: 131 mg/dL — ABNORMAL HIGH (ref 70–99)

## 2018-08-27 MED ORDER — IOPAMIDOL (ISOVUE-370) INJECTION 76%
100.0000 mL | Freq: Once | INTRAVENOUS | Status: AC | PRN
  Administered 2018-08-27: 100 mL via INTRAVENOUS

## 2018-08-27 NOTE — ED Triage Notes (Signed)
Pt states she became confused this morning around 0800 with a left sided facial droop. Pt states she is still feeling confused and lethargic. Pt states she had a fall last week and hit her head. Pt C/O right sided headache after the fall.

## 2018-08-27 NOTE — ED Provider Notes (Signed)
[facility] EMERGENCY DEPARTMENT Provider Note   CSN: [id number] Arrival date & time: 08/27/18  2058     History   Chief Complaint Chief Complaint  Patient presents with  . Possible stroke    HPI [name] [name] is a 82 y.o. female.  HPI  The patient is a 82 year old female, she has multiple medical problems including coronary disease status post CABG in 2001, COPD, history of stroke including a lacunar and right thalamus stroke in 2013, history of myocardial infarction, type 2 diabetes and hyperlipidemia.  She presents to the hospital this evening because of waking up this morning with right-sided facial droop, difficulty with speech, feeling like she has been falling from side to side and some mild changes in her bilateral vision though it is difficult to elicit what the changes are.  The symptoms have been persistent throughout the day.  She awoke with them this morning, it appears that her last seen normal was approximately 24 hours ago.  Her son presents with her and states that she is unable to ambulate unless he helps her, this is very unusual for her.  The symptoms are persistent severe and nothing seems to make it better.  She does take aspirin  Of note the patient did have a fall 5 days  ago, she had a CT scan of her brain which was negative for injury  Past Medical History:  Diagnosis Date  . Arthritis   . CAD (coronary artery disease)    Multivessel status post CABG 2001  . Chronic obstructive pulmonary disease (HCC)   . DDD (degenerative disc disease), lumbar   . Diastolic CHF (HCC)   . Essential hypertension   . Hematuria   . History of CVA (cerebrovascular accident)    2013 - lacunar infarct right thalamus - residual left side of  lip numb  and left eye vision worse (which corrented lens implant from cataract extraction)  . History of MI (myocardial infarction)    July 2001  . Hyperlipidemia   . Hypothyroidism   . Insulin dependent type 2 diabetes mellitus  (HCC)   . Lesion of bladder   . Lower urinary tract symptoms (LUTS)   . OSA (obstructive sleep apnea)   . Peripheral neuropathy   . Psoriasis   . SUI (stress urinary incontinence, female)   . Varicose veins     Patient Active Problem List   Diagnosis Date Noted  . Syncope   . Loss of consciousness (HCC) 06/10/2018  . Hyperglycemia 06/10/2018  . Bradycardia, sinus 06/10/2018  . Diastolic dysfunction 09/29/2017  . CAD (coronary artery disease) 09/28/2017  . Acute lower UTI 09/28/2017  . Ischemic stroke (HCC) 05/10/2017  . Acute ischemic stroke (HCC) 05/10/2017  . Stroke (HCC) 08/01/2016  . Dysarthria 03/18/2016  . Anemia of chronic disease 11/19/2015  . OSA (obstructive sleep apnea) 11/19/2015  . Left facial numbness 08/29/2015  . Gait instability 06/26/2015  . COPD exacerbation (HCC) 11/13/2014  . Weakness generalized 12/10/2013  . TIA (transient ischemic attack) 06/22/2012  . Left-sided headache 06/22/2012  . Noncompliance 06/22/2012  . Noncompliance with CPAP treatment 06/22/2012  . Psoriasis 01/07/2012  . Cerebrovascular disease   . Chronic back pain   . Chronic obstructive pulmonary disease (HCC)   . DM type 2 causing complication (HCC) 02/14/2010  . Obesity 02/14/2010  . Arteriosclerotic cardiovascular disease (ASCVD) 02/14/2010  . Hypothyroidism 02/06/2010  . Hyperlipidemia 02/06/2010  . Essential hypertension 02/06/2010  . Sleep apnea 02/06/2010    Past  Surgical History:  Procedure Laterality Date  . APPENDECTOMY  age 81  . CARDIAC CATHETERIZATION  1991   No sig. cad  . CARDIAC CATHETERIZATION  07-01-2000   high grade in-stent restenosis  . CARDIAC CATHETERIZATION  04-30-2001   single native vessel cad/ graft patent  . CARDIAC CATHETERIZATION  02-13-2006  dr [place] cad with total occluded LAD  and  70% ostial of small ramus/  grafts patent/  normal ef  . CARDIAC CATHETERIZATION  01-20-2000  dr Clifton James   essentially unchanged;  severe single vessel  cad with diffuse disease throughout CFX and RCA/ ef 66%;  chf with preserved LVSF  . CARDIOVASCULAR STRESS TEST  08-28-2011   DR ROTHBART   NEGATIVE NUCLEAR STUDY/  GLOBAL LVSF/  EF 65%  . CARPAL TUNNEL RELEASE Bilateral yrs ago  . CATARACT EXTRACTION W/ INTRAOCULAR LENS  IMPLANT, BILATERAL  2013  . CHOLECYSTECTOMY  1990  . COLONOSCOPY N/A 07/26/2015   Procedure: COLONOSCOPY;  Surgeon: Malissa Hippo, MD;  Location: [facility];  Service: Endoscopy;  Laterality: N/A;  1200  . CORONARY ANGIOPLASTY  11-13-1999   balloon angioplasty to mLAD , d2 of LAD  . CORONARY ANGIOPLASTY WITH STENT PLACEMENT  01-17-2000   PCI stenting to mLAD & D2 of LAD  . CORONARY ARTERY BYPASS GRAFT  07-02-2000   DR Tressie Stalker   SVG to diagonal, LIMA to LAD  . CYSTOSCOPY W/ RETROGRADES Bilateral 08/09/2014   Procedure: CYSTOSCOPY, BILATERAL RETROGRADE, HYDRODISTENSION, BLADDER BIOPSY WITH FULGERATION, INSTILL PYRIDIUM AND MARCAINE ;  Surgeon: Anner Crete, MD;  Location: [facility];  Service: Urology;  Laterality: Bilateral;  . KNEE ARTHROSCOPY Left 10-18-2003   SYNOVECTOMY  . LUMBAR SPINE SURGERY  x2    yrs ago  . ORIF HUMERUS FRACTURE Right 04-18-2008  . REPAIR FLEXOR TENDON HAND Right 09-20-2008   CARPI RADIALIS TENDON TRANSFER TO EXTENSOR TO INDEX, MIDDLE, RING , LITTLE FINGERS AND DIGITI MININI  . ROTATOR CUFF REPAIR Right 1995  . TOTAL ABDOMINAL HYSTERECTOMY W/ BILATERAL SALPINGOOPHORECTOMY  age 88  . TRANSTHORACIC ECHOCARDIOGRAM  12-12-2013   MILD LVH/  EF 60-65%/  GRADE I DIASTOLIC DYSFUNCTION/  MILD LAE     OB History    Gravida  7   Para  7   Term  7   Preterm      AB      Living  4     SAB      TAB      Ectopic      Multiple      Live Births               Home Medications    Prior to Admission medications   Medication Sig Start Date End Date Taking? Authorizing Provider  amLODipine (NORVASC) 5 MG tablet Take 5 mg by mouth daily. 07/03/17   [provider]  aspirin EC 325 MG tablet Take 325 mg by mouth daily.    [provider]  cephALEXin (KEFLEX) 500 MG capsule Take 1 capsule (500 mg total) by mouth 3 (three) times daily. 06/12/18   Oval Linsey, MD  gabapentin (NEURONTIN) 300 MG capsule Take 300 mg by mouth 2 (two) times daily. 09/18/17   [provider]  HYDROcodone-acetaminophen (NORCO/VICODIN) 5-325 MG tablet Take 1 tablet by mouth every 4 (four) hours as needed. 06/29/18   Donnetta Hutching, MD  insulin NPH-regular Human (NOVOLIN 70/30) (70-30) 100 UNIT/ML injection Inject 40 Units into  the skin 2 (two) times daily.    [provider]  ipratropium-albuterol (DUONEB) 0.5-2.5 (3) MG/3ML SOLN Inhale 3 mLs into the lungs 4 (four) times daily as needed. 09/19/17   [provider]  levothyroxine (SYNTHROID, LEVOTHROID) 25 MCG tablet Take 25 mcg by mouth daily.     [provider]  lisinopril (PRINIVIL,ZESTRIL) 10 MG tablet Take 10 mg by mouth daily.    [provider]  metFORMIN (GLUCOPHAGE) 500 MG tablet Take 500 mg by mouth 2 (two) times daily with a meal.      [provider]  nitroGLYCERIN (NITROSTAT) 0.4 MG SL tablet Place 0.4 mg under the tongue every 5 (five) minutes as needed for chest pain.     [provider]  oxyCODONE (OXY IR/ROXICODONE) 5 MG immediate release tablet Take 1 tablet (5 mg total) by mouth every 4 (four) hours as needed (Moderate pain). 06/12/18   Oval Linsey, MD  triamcinolone cream (KENALOG) 0.1 % APPLY TO AFFECTED AREA TWICE A DAY AS DIRECTED FOR FLARES 05/04/18   [provider]    Family History Family History  Problem Relation Age of Onset  . Hypertension Mother   . Stroke Mother   . Coronary artery disease Unknown        Multiple first and second-degree relatives  . Aneurysm Unknown        Cerebral circulation  . Colon cancer Sister   . Stroke Sister   . Coronary artery disease Sister   . Clotting disorder Unknown          Children diagnosed with hypercoagulable state  . Coronary artery disease Brother     Social History Social History   Tobacco Use  . Smoking status: Never Smoker  . Smokeless tobacco: Never Used  Substance Use Topics  . Alcohol use: No  . Drug use: No     Allergies   Codeine   Review of Systems Review of Systems  All other systems reviewed and are negative.    Physical Exam Updated Vital Signs BP (!) 132/107   Pulse 71 Comment: Simultaneous filing. User may not have seen previous data.  Resp 16 Comment: Simultaneous filing. User may not have seen previous data.  SpO2 94% Comment: Simultaneous filing. User may not have seen previous data.  Physical Exam  Constitutional: She appears well-developed and well-nourished. No distress.  HENT:  Head: Normocephalic and atraumatic.  Mouth/Throat: Oropharynx is clear and moist. No oropharyngeal exudate.  Eyes: Pupils are equal, round, and reactive to light. Conjunctivae and EOM are normal. Right eye exhibits no discharge. Left eye exhibits no discharge. No scleral icterus.  Neck: Normal range of motion. Neck supple. No JVD present. No thyromegaly present.  Cardiovascular: Normal rate, regular rhythm, normal heart sounds and intact distal pulses. Exam reveals no gallop and no friction rub.  No murmur heard. Pulmonary/Chest: Effort normal and breath sounds normal. No respiratory distress. She has no wheezes. She has no rales.  Abdominal: Soft. Bowel sounds are normal. She exhibits no distension and no mass. There is no tenderness.  Musculoskeletal: Normal range of motion. She exhibits no edema or tenderness.  Lymphadenopathy:    She has no cervical adenopathy.  Neurological: She is alert. Coordination normal.  Speech is slightly slurred, right side of the face is drooping, there is slight right pronator drift, slight dysmetria of the right arm, slight weakness in the right leg, it is very subtle.  Gross visual acuity is  slightly off however overall the  patient's cranial nerves III through XII are normal except for slight facial droop.  Memory is intact.  Coordination is normal by finger-nose-finger.  Skin: Skin is warm and dry. No rash noted. No erythema.  Psychiatric: She has a normal mood and affect. Her behavior is normal.  Nursing note and vitals reviewed.    ED Treatments / Results  Labs (all labs ordered are listed, but only abnormal results are displayed) Labs Reviewed  COMPREHENSIVE METABOLIC PANEL - Abnormal; Notable for the following components:      Result Value   Glucose, Bld 144 (*)    Albumin 3.4 (*)    AST 10 (*)    All other components within normal limits  CBC - Abnormal; Notable for the following components:   WBC 13.6 (*)    MCH 25.5 (*)    All other components within normal limits  DIFFERENTIAL - Abnormal; Notable for the following components:   Neutro Abs 8.5 (*)    Monocytes Absolute 1.4 (*)    All other components within normal limits  CBG MONITORING, ED - Abnormal; Notable for the following components:   Glucose-Capillary 131 (*)    All other components within normal limits  I-STAT CHEM 8, ED - Abnormal; Notable for the following components:   Glucose, Bld 138 (*)    Calcium, Ion 1.12 (*)    All other components within normal limits  PROTIME-INR  APTT  ETHANOL  I-STAT TROPONIN, ED    EKG EKG Interpretation  Date/Time:  Thursday August 27 2018 21:25:22 EDT Ventricular Rate:  71 PR Interval:    QRS Duration: 102 QT Interval:  390 QTC Calculation: 424 R Axis:   -34 Text Interpretation:  Sinus rhythm LVH with secondary repolarization abnormality Inferior infarct, old Anterior infarct, old since last tracing no significant change Confirmed by Eber Hong ([id number]) on 08/27/2018 9:28:25 PM   Radiology Ct Angio Head W Or Wo Contrast  Result Date: 08/27/2018 CLINICAL DATA:  Initial evaluation for acute confusion, left-sided facial droop. EXAM: CT ANGIOGRAPHY HEAD  AND NECK TECHNIQUE: Multidetector CT imaging of the head and neck was performed using the standard protocol during bolus administration of intravenous contrast. Multiplanar CT image reconstructions and MIPs were obtained to evaluate the vascular anatomy. Carotid stenosis measurements (when applicable) are obtained utilizing NASCET criteria, using the distal internal carotid diameter as the denominator. CONTRAST:  ISOVUE-370 IOPAMIDOL (ISOVUE-370) INJECTION 76% COMPARISON:  Prior CT from 08/23/2018. FINDINGS: CT HEAD FINDINGS Brain: Moderate cerebral atrophy with chronic small vessel ischemic disease. No acute intracranial hemorrhage. No acute large vessel territory infarct. No mass lesion, midline shift or mass effect. No hydrocephalus. No extra-axial fluid collection. Vascular: No hyperdense vessel. Scattered vascular calcifications noted within the carotid siphons. Skull: Scalp soft tissues and calvarium within normal limits. Sinuses: Partial opacification of the visualized right sphenoid sinus. Visualized paranasal sinuses and mastoid air cells are otherwise clear. Orbits: Globes and orbital soft tissues within normal limits. Review of the MIP images confirms the above findings CTA NECK FINDINGS Aortic arch: Visualized aortic arch of normal caliber with normal 3 vessel morphology. No hemodynamically significant stenosis about the origin of the great vessels. Visualized subclavian arteries widely patent. Right carotid system: Right common carotid artery tortuous proximally but widely patent to the bifurcation. Scattered mixed plaque about the right bifurcation without hemodynamically significant stenosis. Right ICA tortuous but widely patent to the skull base without stenosis, dissection, or occlusion. Left carotid system: Left common carotid artery tortuous proximally but widely patent  to the bifurcation without stenosis. Mixed atheromatous plaque about the left bifurcation without hemodynamically  significant stenosis. Left ICA tortuous but widely patent distally to the skull base without stenosis, dissection or occlusion. Vertebral arteries: Both of the vertebral arteries arise from the subclavian arteries. Vertebral arteries widely patent within the neck without stenosis, dissection, or occlusion. Skeleton: No acute osseus abnormality. No worrisome lytic or blastic osseous lesions. Moderate cervical spondylolysis at C5-6 and C6-7. Other neck: Visualized soft tissues of the neck demonstrate no acute finding. Salivary glands normal. Thyroid normal. No adenopathy. Upper chest: Visualized upper chest within normal limits. Sequelae of prior CABG partially visualized. Postsurgical changes and/or scarring noted at the lung apices bilaterally. Review of the MIP images confirms the above findings CTA HEAD FINDINGS Anterior circulation: Petrous segments widely patent bilaterally. Moderate to advanced atheromatous change throughout the carotid siphons bilaterally. Associated moderate approximate 50% stenosis at the supraclinoid left ICA, with moderate to severe approximate 75% stenosis at the supraclinoid right ICA. ICA termini patent bilaterally. Left A1 dominant and widely patent. Right A1 diminutive with severe proximal right A1 stenosis (series 11, image 78). Normal anterior communicating artery. Short-segment severe distal left A2 stenosis (series 11, image 61). Anterior cerebral arteries otherwise patent to its distal aspects. M1 segments patent without high-grade stenosis or occlusion. Normal MCA bifurcations. Short-segment severe proximal left M2 stenosis (series 10, image 100). No proximal M2 occlusion. Distal MCA branches demonstrate multifocal atheromatous irregularity but are well perfused and fairly symmetric. Posterior circulation: Vertebral arteries patent to the vertebrobasilar junction without stenosis. Posterior inferior cerebral arteries patent bilaterally. Basilar patent to its distal aspect  without stenosis. Superior cerebral arteries patent bilaterally. Left PCA supplied via the basilar. Fetal type origin of the right PCA. Multifocal moderate to severe bilateral P2 stenoses, right worse than left. PCAs are patent to their distal aspects. Venous sinuses: Patent. Anatomic variants: Fetal type origin of the right PCA. Hypoplastic right P1 segment. Delayed phase: No abnormal enhancement. Review of the MIP images confirms the above findings IMPRESSION: CT HEAD IMPRESSION 1. No acute intracranial abnormality. 2. Moderate cerebral atrophy with chronic small vessel ischemic disease. CTA HEAD AND NECK IMPRESSION 1. Negative CTA for emergent large vessel occlusion. 2. Moderate to advanced atherosclerotic change throughout the intracranial circulation with multifocal moderate to severe stenoses as detailed above. Changes most notable at the supraclinoid ICAs, right A1 segment, and bilateral P2 segments. 3. Mild atheromatous change about the carotid bifurcations without significant stenosis. No critical flow limiting stenosis within the neck. Electronically Signed   By: Rise Mu M.D.   On: 08/27/2018 23:19   Ct Angio Neck W Or Wo Contrast  Result Date: 08/27/2018 CLINICAL DATA:  Initial evaluation for acute confusion, left-sided facial droop. EXAM: CT ANGIOGRAPHY HEAD AND NECK TECHNIQUE: Multidetector CT imaging of the head and neck was performed using the standard protocol during bolus administration of intravenous contrast. Multiplanar CT image reconstructions and MIPs were obtained to evaluate the vascular anatomy. Carotid stenosis measurements (when applicable) are obtained utilizing NASCET criteria, using the distal internal carotid diameter as the denominator. CONTRAST:  ISOVUE-370 IOPAMIDOL (ISOVUE-370) INJECTION 76% COMPARISON:  Prior CT from 08/23/2018. FINDINGS: CT HEAD FINDINGS Brain: Moderate cerebral atrophy with chronic small vessel ischemic disease. No acute intracranial  hemorrhage. No acute large vessel territory infarct. No mass lesion, midline shift or mass effect. No hydrocephalus. No extra-axial fluid collection. Vascular: No hyperdense vessel. Scattered vascular calcifications noted within the carotid siphons. Skull: Scalp soft tissues and calvarium within  normal limits. Sinuses: Partial opacification of the visualized right sphenoid sinus. Visualized paranasal sinuses and mastoid air cells are otherwise clear. Orbits: Globes and orbital soft tissues within normal limits. Review of the MIP images confirms the above findings CTA NECK FINDINGS Aortic arch: Visualized aortic arch of normal caliber with normal 3 vessel morphology. No hemodynamically significant stenosis about the origin of the great vessels. Visualized subclavian arteries widely patent. Right carotid system: Right common carotid artery tortuous proximally but widely patent to the bifurcation. Scattered mixed plaque about the right bifurcation without hemodynamically significant stenosis. Right ICA tortuous but widely patent to the skull base without stenosis, dissection, or occlusion. Left carotid system: Left common carotid artery tortuous proximally but widely patent to the bifurcation without stenosis. Mixed atheromatous plaque about the left bifurcation without hemodynamically significant stenosis. Left ICA tortuous but widely patent distally to the skull base without stenosis, dissection or occlusion. Vertebral arteries: Both of the vertebral arteries arise from the subclavian arteries. Vertebral arteries widely patent within the neck without stenosis, dissection, or occlusion. Skeleton: No acute osseus abnormality. No worrisome lytic or blastic osseous lesions. Moderate cervical spondylolysis at C5-6 and C6-7. Other neck: Visualized soft tissues of the neck demonstrate no acute finding. Salivary glands normal. Thyroid normal. No adenopathy. Upper chest: Visualized upper chest within normal limits. Sequelae  of prior CABG partially visualized. Postsurgical changes and/or scarring noted at the lung apices bilaterally. Review of the MIP images confirms the above findings CTA HEAD FINDINGS Anterior circulation: Petrous segments widely patent bilaterally. Moderate to advanced atheromatous change throughout the carotid siphons bilaterally. Associated moderate approximate 50% stenosis at the supraclinoid left ICA, with moderate to severe approximate 75% stenosis at the supraclinoid right ICA. ICA termini patent bilaterally. Left A1 dominant and widely patent. Right A1 diminutive with severe proximal right A1 stenosis (series 11, image 78). Normal anterior communicating artery. Short-segment severe distal left A2 stenosis (series 11, image 61). Anterior cerebral arteries otherwise patent to its distal aspects. M1 segments patent without high-grade stenosis or occlusion. Normal MCA bifurcations. Short-segment severe proximal left M2 stenosis (series 10, image 100). No proximal M2 occlusion. Distal MCA branches demonstrate multifocal atheromatous irregularity but are well perfused and fairly symmetric. Posterior circulation: Vertebral arteries patent to the vertebrobasilar junction without stenosis. Posterior inferior cerebral arteries patent bilaterally. Basilar patent to its distal aspect without stenosis. Superior cerebral arteries patent bilaterally. Left PCA supplied via the basilar. Fetal type origin of the right PCA. Multifocal moderate to severe bilateral P2 stenoses, right worse than left. PCAs are patent to their distal aspects. Venous sinuses: Patent. Anatomic variants: Fetal type origin of the right PCA. Hypoplastic right P1 segment. Delayed phase: No abnormal enhancement. Review of the MIP images confirms the above findings IMPRESSION: CT HEAD IMPRESSION 1. No acute intracranial abnormality. 2. Moderate cerebral atrophy with chronic small vessel ischemic disease. CTA HEAD AND NECK IMPRESSION 1. Negative CTA for  emergent large vessel occlusion. 2. Moderate to advanced atherosclerotic change throughout the intracranial circulation with multifocal moderate to severe stenoses as detailed above. Changes most notable at the supraclinoid ICAs, right A1 segment, and bilateral P2 segments. 3. Mild atheromatous change about the carotid bifurcations without significant stenosis. No critical flow limiting stenosis within the neck. Electronically Signed   By: Rise Mu M.D.   On: 08/27/2018 23:19    Procedures Procedures (including critical care time)  Medications Ordered in ED Medications  iopamidol (ISOVUE-370) 76 % injection 100 mL (100 mLs Intravenous Contrast Given 08/27/18 2220)  Initial Impression / Assessment and Plan / ED Course  I have reviewed the triage vital signs and the nursing notes.  Pertinent labs & imaging results that were available during my care of the patient were reviewed by me and considered in my medical decision making (see chart for details).     The patient is close to being 24 hours outside of acute onset of symptoms given that she fell asleep last night about this time.  She has focal neurologic deficits which raise suspicion for acute ischemic stroke though with her recent head injury she will need to have an emergent CT scan as well as a CT angiogram.  CT scan of the brain as well as a CT angiogram shows that there is stenosis of the intracranial vessels but no large vessel occlusion.  No acute findings on noncontrast CT of the brain to suggest hemorrhage bleeding or aneurysm.  Discussed the patient's care with Dr. Odie Sera who will admit the patient to the hospital for the stroke work-up.  Laboratory work-up was otherwise unremarkable.  Final Clinical Impressions(s) / ED Diagnoses   Final diagnoses:  Acute ischemic stroke [facility])    ED Discharge Orders    None       Eber Hong, MD 08/27/18 2337

## 2018-08-28 ENCOUNTER — Observation Stay (HOSPITAL_BASED_OUTPATIENT_CLINIC_OR_DEPARTMENT_OTHER): Payer: Medicare HMO

## 2018-08-28 ENCOUNTER — Observation Stay (HOSPITAL_COMMUNITY): Payer: Medicare HMO

## 2018-08-28 ENCOUNTER — Encounter (HOSPITAL_COMMUNITY): Payer: Self-pay | Admitting: Family Medicine

## 2018-08-28 DIAGNOSIS — E032 Hypothyroidism due to medicaments and other exogenous substances: Secondary | ICD-10-CM

## 2018-08-28 DIAGNOSIS — I5030 Unspecified diastolic (congestive) heart failure: Secondary | ICD-10-CM | POA: Diagnosis not present

## 2018-08-28 DIAGNOSIS — I10 Essential (primary) hypertension: Secondary | ICD-10-CM

## 2018-08-28 DIAGNOSIS — I639 Cerebral infarction, unspecified: Secondary | ICD-10-CM

## 2018-08-28 DIAGNOSIS — I25708 Atherosclerosis of coronary artery bypass graft(s), unspecified, with other forms of angina pectoris: Secondary | ICD-10-CM | POA: Diagnosis not present

## 2018-08-28 DIAGNOSIS — E118 Type 2 diabetes mellitus with unspecified complications: Secondary | ICD-10-CM | POA: Diagnosis not present

## 2018-08-28 DIAGNOSIS — N390 Urinary tract infection, site not specified: Secondary | ICD-10-CM

## 2018-08-28 LAB — URINALYSIS, ROUTINE W REFLEX MICROSCOPIC
Bilirubin Urine: NEGATIVE
Hgb urine dipstick: NEGATIVE
KETONES UR: NEGATIVE mg/dL
NITRITE: POSITIVE — AB
PH: 6 (ref 5.0–8.0)
Protein, ur: NEGATIVE mg/dL
Specific Gravity, Urine: >1.046 — ABNORMAL HIGH (ref 1.005–1.030)
WBC, UA: >50 WBC/hpf — ABNORMAL HIGH (ref 0–5)

## 2018-08-28 LAB — GLUCOSE, CAPILLARY
GLUCOSE-CAPILLARY: 79 mg/dL (ref 70–99)
GLUCOSE-CAPILLARY: 231 mg/dL — AB (ref 70–99)
GLUCOSE-CAPILLARY: 193 mg/dL — AB (ref 70–99)
GLUCOSE-CAPILLARY: 331 mg/dL — AB (ref 70–99)
Glucose-Capillary: 141 mg/dL — ABNORMAL HIGH (ref 70–99)

## 2018-08-28 LAB — LIPID PANEL
Cholesterol: 173 mg/dL (ref 0–200)
HDL: 41 mg/dL (ref >40)
LDL CALC: 113 mg/dL — AB (ref 0–99)
Total CHOL/HDL Ratio: 4.2 RATIO
Triglycerides: 94 mg/dL (ref <150)
VLDL: 19 mg/dL (ref 0–40)

## 2018-08-28 LAB — ECHOCARDIOGRAM COMPLETE

## 2018-08-28 LAB — HEMOGLOBIN A1C
HEMOGLOBIN A1C: 8.4 % — AB (ref 4.8–5.6)
Mean Plasma Glucose: 194.38 mg/dL

## 2018-08-28 MED ORDER — SODIUM CHLORIDE 0.9 % IV SOLN
INTRAVENOUS | Status: AC
  Administered 2018-08-28: 02:00:00 via INTRAVENOUS

## 2018-08-28 MED ORDER — ZOLPIDEM TARTRATE 5 MG PO TABS
5.0000 mg | ORAL_TABLET | Freq: Every evening | ORAL | Status: DC | PRN
  Administered 2018-08-28 – 2018-08-31 (×4): 5 mg via ORAL
  Filled 2018-08-28 (×4): qty 1

## 2018-08-28 MED ORDER — LEVOTHYROXINE SODIUM 50 MCG PO TABS
25.0000 ug | ORAL_TABLET | Freq: Every day | ORAL | Status: DC
  Administered 2018-08-28 – 2018-09-01 (×5): 25 ug via ORAL
  Filled 2018-08-28 (×5): qty 1

## 2018-08-28 MED ORDER — ACETAMINOPHEN 160 MG/5ML PO SOLN: 650.0000 mg | ORAL | Status: DC | PRN

## 2018-08-28 MED ORDER — ENOXAPARIN SODIUM 40 MG/0.4ML ~~LOC~~ SOLN
40.0000 mg | SUBCUTANEOUS | Status: DC
  Administered 2018-08-28 – 2018-09-01 (×5): 40 mg via SUBCUTANEOUS
  Filled 2018-08-28 (×5): qty 0.4

## 2018-08-28 MED ORDER — HYDROCODONE-ACETAMINOPHEN 5-325 MG PO TABS
1.0000 | ORAL_TABLET | ORAL | Status: DC | PRN
  Administered 2018-08-28 (×3): 1 via ORAL
  Administered 2018-08-29 – 2018-08-30 (×5): 2 via ORAL
  Administered 2018-08-30: 1 via ORAL
  Administered 2018-08-30: 2 via ORAL
  Administered 2018-08-30: 1 via ORAL
  Administered 2018-08-31 (×2): 2 via ORAL
  Filled 2018-08-28 (×2): qty 2
  Filled 2018-08-28: qty 1
  Filled 2018-08-28 (×4): qty 2
  Filled 2018-08-28 (×2): qty 1
  Filled 2018-08-28 (×2): qty 2
  Filled 2018-08-28 (×3): qty 1

## 2018-08-28 MED ORDER — GABAPENTIN 300 MG PO CAPS
300.0000 mg | ORAL_CAPSULE | Freq: Two times a day (BID) | ORAL | Status: DC
  Administered 2018-08-28 – 2018-09-01 (×10): 300 mg via ORAL
  Filled 2018-08-28 (×10): qty 1

## 2018-08-28 MED ORDER — INSULIN DETEMIR 100 UNIT/ML ~~LOC~~ SOLN
10.0000 [IU] | Freq: Two times a day (BID) | SUBCUTANEOUS | Status: DC
  Administered 2018-08-28 – 2018-08-31 (×7): 10 [IU] via SUBCUTANEOUS
  Filled 2018-08-28 (×10): qty 0.1

## 2018-08-28 MED ORDER — LORAZEPAM 2 MG/ML IJ SOLN
1.0000 mg | Freq: Once | INTRAMUSCULAR | Status: DC
  Filled 2018-08-28: qty 1

## 2018-08-28 MED ORDER — SENNOSIDES-DOCUSATE SODIUM 8.6-50 MG PO TABS
1.0000 | ORAL_TABLET | Freq: Every evening | ORAL | Status: DC | PRN
  Administered 2018-08-28 – 2018-08-30 (×3): 1 via ORAL
  Filled 2018-08-28 (×3): qty 1

## 2018-08-28 MED ORDER — ASPIRIN EC 325 MG PO TBEC
325.0000 mg | DELAYED_RELEASE_TABLET | Freq: Every day | ORAL | Status: DC
  Administered 2018-08-28 – 2018-09-01 (×5): 325 mg via ORAL
  Filled 2018-08-28 (×5): qty 1

## 2018-08-28 MED ORDER — INSULIN ASPART 100 UNIT/ML ~~LOC~~ SOLN
0.0000 [IU] | Freq: Three times a day (TID) | SUBCUTANEOUS | Status: DC
  Administered 2018-08-28: 2 [IU] via SUBCUTANEOUS
  Administered 2018-08-28: 1 [IU] via SUBCUTANEOUS
  Administered 2018-08-28 – 2018-08-29 (×3): 3 [IU] via SUBCUTANEOUS
  Administered 2018-08-29: 5 [IU] via SUBCUTANEOUS
  Administered 2018-08-30: 3 [IU] via SUBCUTANEOUS
  Administered 2018-08-30: 2 [IU] via SUBCUTANEOUS
  Administered 2018-08-30 – 2018-08-31 (×2): 7 [IU] via SUBCUTANEOUS
  Administered 2018-08-31: 2 [IU] via SUBCUTANEOUS
  Administered 2018-08-31: 7 [IU] via SUBCUTANEOUS
  Administered 2018-09-01: 9 [IU] via SUBCUTANEOUS
  Administered 2018-09-01: 5 [IU] via SUBCUTANEOUS

## 2018-08-28 MED ORDER — SODIUM CHLORIDE 0.9 % IV SOLN
1.0000 g | INTRAVENOUS | Status: DC
  Administered 2018-08-28 – 2018-08-30 (×3): 1 g via INTRAVENOUS
  Filled 2018-08-28: qty 10
  Filled 2018-08-28: qty 1
  Filled 2018-08-28: qty 10
  Filled 2018-08-28: qty 1
  Filled 2018-08-28: qty 10

## 2018-08-28 MED ORDER — IPRATROPIUM-ALBUTEROL 0.5-2.5 (3) MG/3ML IN SOLN: 3.0000 mL | RESPIRATORY_TRACT | Status: DC | PRN

## 2018-08-28 MED ORDER — INSULIN ASPART 100 UNIT/ML ~~LOC~~ SOLN
0.0000 [IU] | Freq: Every day | SUBCUTANEOUS | Status: DC
  Administered 2018-08-28: 4 [IU] via SUBCUTANEOUS
  Administered 2018-08-29 – 2018-08-31 (×2): 3 [IU] via SUBCUTANEOUS

## 2018-08-28 MED ORDER — ZOLPIDEM TARTRATE 5 MG PO TABS
5.0000 mg | ORAL_TABLET | Freq: Every evening | ORAL | Status: AC | PRN
  Administered 2018-08-28: 5 mg via ORAL
  Filled 2018-08-28: qty 1

## 2018-08-28 MED ORDER — ACETAMINOPHEN 650 MG RE SUPP: 650.0000 mg | RECTAL | Status: DC | PRN

## 2018-08-28 MED ORDER — STROKE: EARLY STAGES OF RECOVERY BOOK
Freq: Once | Status: AC
  Administered 2018-08-28: 03:00:00
  Filled 2018-08-28 (×2): qty 1

## 2018-08-28 MED ORDER — ACETAMINOPHEN 325 MG PO TABS
650.0000 mg | ORAL_TABLET | ORAL | Status: DC | PRN
  Administered 2018-08-28 – 2018-08-29 (×2): 650 mg via ORAL
  Filled 2018-08-28 (×2): qty 2

## 2018-08-28 NOTE — Clinical Social Work Note (Signed)
Patient lives at home with her husband. At baseline, she is independent in all ADLs and drives. Patient states that she does not want to go to SNF and that she wants to go home.  Patient is agreeable to [facility] and wants [facility].   Case management is aware.    LCSW signing off.     Clarnce Homan, Juleen China, LCSW

## 2018-08-28 NOTE — Progress Notes (Signed)
*  PRELIMINARY RESULTS* Echocardiogram 2D Echocardiogram has been performed.  [name] [name] 08/28/2018, 11:01 AM

## 2018-08-28 NOTE — H&P (Addendum)
History and Physical    [name] [name]:[id number] DOB: [birth year] DOA: 08/27/2018  PCP: Oval Linsey, MD   Patient coming from: Home   Chief Complaint: Gait and speech difficulty, confusion, slurred speech   HPI: [name] [name] is a 82 y.o. female with medical history significant for coronary artery disease status post remote CABG, history of CVA, COPD, hypertension, and insulin-dependent diabetes mellitus, now presenting to the emergency department for evaluation of speech and gait disturbance.  Patient reportedly went to bed in her usual state the night of 08/26/2018, but woke the following morning with balance difficulty, unable to walk without assistance, and was noted by family to have some confusion and slurred speech.  Patient has been experiencing a right-sided headache.  She fell several days ago and was evaluated in the emergency department at that time with negative head CT.  She reports a history of stroke, but denies any residual deficits.  Her deficits have seemed to improve and confusion seems to have resolved, but she continues to have difficulty ambulating and subtle dysarthria that was not present when she went to bed on 08/26/2018 per report of family.  ED Course: Upon arrival to the ED, patient is found to be saturating well on room air, and with vitals otherwise normal.  EKG features a sinus rhythm with LVH and repolarization abnormality.  CTA head and neck is negative for acute intracranial abnormality or emergent large vessel occlusion, but notable for moderate to severe intracranial stenoses.  Chemistry panel is unremarkable and CBC features a leukocytosis to 13,600.  Patient remains hemodynamically stable and will be observed in the hospital for ongoing evaluation and management of focal neurologic deficit suspected secondary to acute ischemic CVA.  Review of Systems:  All other systems reviewed and apart from HPI, are negative.  Past Medical History:  Diagnosis  Date  . Arthritis   . CAD (coronary artery disease)    Multivessel status post CABG 2001  . Chronic obstructive pulmonary disease (HCC)   . DDD (degenerative disc disease), lumbar   . Diastolic CHF (HCC)   . Essential hypertension   . Hematuria   . History of CVA (cerebrovascular accident)    2013 - lacunar infarct right thalamus - residual left side of  lip numb  and left eye vision worse (which corrented lens implant from cataract extraction)  . History of MI (myocardial infarction)    July 2001  . Hyperlipidemia   . Hypothyroidism   . Insulin dependent type 2 diabetes mellitus (HCC)   . Lesion of bladder   . Lower urinary tract symptoms (LUTS)   . OSA (obstructive sleep apnea)   . Peripheral neuropathy   . Psoriasis   . SUI (stress urinary incontinence, female)   . Varicose veins     Past Surgical History:  Procedure Laterality Date  . APPENDECTOMY  age 54  . CARDIAC CATHETERIZATION  1991   No sig. cad  . CARDIAC CATHETERIZATION  07-01-2000   high grade in-stent restenosis  . CARDIAC CATHETERIZATION  04-30-2001   single native vessel cad/ graft patent  . CARDIAC CATHETERIZATION  02-13-2006  dr [place] cad with total occluded LAD  and  70% ostial of small ramus/  grafts patent/  normal ef  . CARDIAC CATHETERIZATION  01-20-2000  dr Clifton James   essentially unchanged;  severe single vessel cad with diffuse disease throughout CFX and RCA/ ef 66%;  chf with preserved LVSF  . CARDIOVASCULAR STRESS TEST  08-28-2011  DR Dietrich Pates   NEGATIVE NUCLEAR STUDY/  GLOBAL LVSF/  EF 65%  . CARPAL TUNNEL RELEASE Bilateral yrs ago  . CATARACT EXTRACTION W/ INTRAOCULAR LENS  IMPLANT, BILATERAL  2013  . CHOLECYSTECTOMY  1990  . COLONOSCOPY N/A 07/26/2015   Procedure: COLONOSCOPY;  Surgeon: Malissa Hippo, MD;  Location: [facility];  Service: Endoscopy;  Laterality: N/A;  1200  . CORONARY ANGIOPLASTY  11-13-1999   balloon angioplasty to mLAD , d2 of LAD  . CORONARY ANGIOPLASTY WITH  STENT PLACEMENT  01-17-2000   PCI stenting to mLAD & D2 of LAD  . CORONARY ARTERY BYPASS GRAFT  07-02-2000   DR Tressie Stalker   SVG to diagonal, LIMA to LAD  . CYSTOSCOPY W/ RETROGRADES Bilateral 08/09/2014   Procedure: CYSTOSCOPY, BILATERAL RETROGRADE, HYDRODISTENSION, BLADDER BIOPSY WITH FULGERATION, INSTILL PYRIDIUM AND MARCAINE ;  Surgeon: Anner Crete, MD;  Location: [facility];  Service: Urology;  Laterality: Bilateral;  . KNEE ARTHROSCOPY Left 10-18-2003   SYNOVECTOMY  . LUMBAR SPINE SURGERY  x2    yrs ago  . ORIF HUMERUS FRACTURE Right 04-18-2008  . REPAIR FLEXOR TENDON HAND Right 09-20-2008   CARPI RADIALIS TENDON TRANSFER TO EXTENSOR TO INDEX, MIDDLE, RING , LITTLE FINGERS AND DIGITI MININI  . ROTATOR CUFF REPAIR Right 1995  . TOTAL ABDOMINAL HYSTERECTOMY W/ BILATERAL SALPINGOOPHORECTOMY  age 30  . TRANSTHORACIC ECHOCARDIOGRAM  12-12-2013   MILD LVH/  EF 60-65%/  GRADE I DIASTOLIC DYSFUNCTION/  MILD LAE     reports that she has never smoked. She has never used smokeless tobacco. She reports that she does not drink alcohol or use drugs.  Allergies  Allergen Reactions  . Codeine Itching    Family History  Problem Relation Age of Onset  . Hypertension Mother   . Stroke Mother   . Coronary artery disease Unknown        Multiple first and second-degree relatives  . Aneurysm Unknown        Cerebral circulation  . Colon cancer Sister   . Stroke Sister   . Coronary artery disease Sister   . Clotting disorder Unknown        Children diagnosed with hypercoagulable state  . Coronary artery disease Brother      Prior to Admission medications   Medication Sig Start Date End Date Taking? Authorizing Provider  amLODipine (NORVASC) 5 MG tablet Take 5 mg by mouth daily. 07/03/17   [provider]  aspirin EC 325 MG tablet Take 325 mg by mouth daily.    [provider]  cephALEXin (KEFLEX) 500 MG capsule Take 1 capsule (500 mg total) by mouth 3  (three) times daily. 06/12/18   Oval Linsey, MD  gabapentin (NEURONTIN) 300 MG capsule Take 300 mg by mouth 2 (two) times daily. 09/18/17   [provider]  HYDROcodone-acetaminophen (NORCO/VICODIN) 5-325 MG tablet Take 1 tablet by mouth every 4 (four) hours as needed. 06/29/18   Donnetta Hutching, MD  insulin NPH-regular Human (NOVOLIN 70/30) (70-30) 100 UNIT/ML injection Inject 40 Units into the skin 2 (two) times daily.    [provider]  ipratropium-albuterol (DUONEB) 0.5-2.5 (3) MG/3ML SOLN Inhale 3 mLs into the lungs 4 (four) times daily as needed. 09/19/17   [provider]  levothyroxine (SYNTHROID, LEVOTHROID) 25 MCG tablet Take 25 mcg by mouth daily.     [provider]  lisinopril (PRINIVIL,ZESTRIL) 10 MG tablet Take 10 mg by mouth daily.    [provider]  metFORMIN (GLUCOPHAGE) 500 MG tablet Take 500 mg by mouth 2 (two) times daily with a meal.      [provider]  nitroGLYCERIN (NITROSTAT) 0.4 MG SL tablet Place 0.4 mg under the tongue every 5 (five) minutes as needed for chest pain.     [provider]  oxyCODONE (OXY IR/ROXICODONE) 5 MG immediate release tablet Take 1 tablet (5 mg total) by mouth every 4 (four) hours as needed (Moderate pain). 06/12/18   Oval Linsey, MD  triamcinolone cream (KENALOG) 0.1 % APPLY TO AFFECTED AREA TWICE A DAY AS DIRECTED FOR FLARES 05/04/18   [provider]    Physical Exam: Vitals:   08/27/18 2300 08/27/18 2330 08/28/18 0030 08/28/18 0100  BP: (!) 132/107 (!) 123/50 (!) 120/91 (!) 146/62  Pulse: 71 72 66 66  Resp: 16 19 16 12   SpO2: 94% 97% 95% 92%      Constitutional: NAD, calm  Eyes: PERTLA, lids and conjunctivae normal ENMT: Mucous membranes are moist. Posterior pharynx clear of any exudate or lesions.   Neck: normal, supple, no masses, no thyromegaly Respiratory: occasional wheeze, no rhonchi, no crackles. Normal respiratory effort.    Cardiovascular: S1 & S2  heard, regular rate and rhythm. No extremity edema.  Abdomen: No distension, no tenderness, soft. Bowel sounds normal.  Musculoskeletal: no clubbing / cyanosis. No joint deformity upper and lower extremities.    Skin: Erythematous plaque with scale involving anterior RLE. Warm, dry, well-perfused. Neurologic: right lower facial weakness. Mild dysarthria. Sensation intact. Dysmetria involving RUE. Strength 5/5 in all 4 limbs.  Psychiatric: Normal judgment and insight. Alert and oriented x 3. Normal mood and affect.     Labs on Admission: I have personally reviewed following labs and imaging studies  CBC: Recent Labs  Lab 08/27/18 2133 08/27/18 2138  WBC 13.6*  --   NEUTROABS 8.5*  --   HGB 12.8 13.3  HCT 41.2 39.0  MCV 82.2  --   PLT 281  --    Basic Metabolic Panel: Recent Labs  Lab 08/27/18 2132 08/27/18 2138  NA 140 140  K 3.8 3.7  CL 105 103  CO2 30  --   GLUCOSE 144* 138*  BUN 16 15  CREATININE 0.73 0.80  CALCIUM 8.9  --    GFR: Estimated Creatinine Clearance: 59 mL/min (by C-G formula based on SCr of 0.8 mg/dL). Liver Function Tests: Recent Labs  Lab 08/27/18 2132  AST 10*  ALT 9  ALKPHOS 81  BILITOT 0.4  PROT 6.5  ALBUMIN 3.4*   No results for input(s): LIPASE, AMYLASE in the last 168 hours. No results for input(s): AMMONIA in the last 168 hours. Coagulation Profile: Recent Labs  Lab 08/27/18 2132  INR 1.00   Cardiac Enzymes: No results for input(s): CKTOTAL, CKMB, CKMBINDEX, TROPONINI in the last 168 hours. BNP (last 3 results) No results for input(s): PROBNP in the last 8760 hours. HbA1C: No results for input(s): HGBA1C in the last 72 hours. CBG: Recent Labs  Lab 08/27/18 2148  GLUCAP 131*   Lipid Profile: No results for input(s): CHOL, HDL, LDLCALC, TRIG, CHOLHDL, LDLDIRECT in the last 72 hours. Thyroid Function Tests: No results for input(s): TSH, T4TOTAL, FREET4, T3FREE, THYROIDAB in the last 72 hours. Anemia Panel: No results for  input(s): VITAMINB12, FOLATE, FERRITIN, TIBC, IRON, RETICCTPCT in the last 72 hours. Urine analysis:    Component Value Date/Time   COLORURINE YELLOW 08/27/2018 2359   APPEARANCEUR HAZY (A) 08/27/2018 2359   LABSPEC >  1.046 (H) 08/27/2018 2359   PHURINE 6.0 08/27/2018 2359   GLUCOSEU >=500 (A) 08/27/2018 2359   HGBUR NEGATIVE 08/27/2018 2359   BILIRUBINUR NEGATIVE 08/27/2018 2359   KETONESUR NEGATIVE 08/27/2018 2359   PROTEINUR NEGATIVE 08/27/2018 2359   UROBILINOGEN 0.2 08/29/2015 1842   NITRITE POSITIVE (A) 08/27/2018 2359   LEUKOCYTESUR LARGE (A) 08/27/2018 2359   Sepsis Labs: @LABRCNTIP (procalcitonin:4,lacticidven:4) )No results found for this or any previous visit (from the past 240 hour(s)).   Radiological Exams on Admission: Ct Angio Head W Or Wo Contrast  Result Date: 08/27/2018 CLINICAL DATA:  Initial evaluation for acute confusion, left-sided facial droop. EXAM: CT ANGIOGRAPHY HEAD AND NECK TECHNIQUE: Multidetector CT imaging of the head and neck was performed using the standard protocol during bolus administration of intravenous contrast. Multiplanar CT image reconstructions and MIPs were obtained to evaluate the vascular anatomy. Carotid stenosis measurements (when applicable) are obtained utilizing NASCET criteria, using the distal internal carotid diameter as the denominator. CONTRAST:  ISOVUE-370 IOPAMIDOL (ISOVUE-370) INJECTION 76% COMPARISON:  Prior CT from 08/23/2018. FINDINGS: CT HEAD FINDINGS Brain: Moderate cerebral atrophy with chronic small vessel ischemic disease. No acute intracranial hemorrhage. No acute large vessel territory infarct. No mass lesion, midline shift or mass effect. No hydrocephalus. No extra-axial fluid collection. Vascular: No hyperdense vessel. Scattered vascular calcifications noted within the carotid siphons. Skull: Scalp soft tissues and calvarium within normal limits. Sinuses: Partial opacification of the visualized right sphenoid sinus.  Visualized paranasal sinuses and mastoid air cells are otherwise clear. Orbits: Globes and orbital soft tissues within normal limits. Review of the MIP images confirms the above findings CTA NECK FINDINGS Aortic arch: Visualized aortic arch of normal caliber with normal 3 vessel morphology. No hemodynamically significant stenosis about the origin of the great vessels. Visualized subclavian arteries widely patent. Right carotid system: Right common carotid artery tortuous proximally but widely patent to the bifurcation. Scattered mixed plaque about the right bifurcation without hemodynamically significant stenosis. Right ICA tortuous but widely patent to the skull base without stenosis, dissection, or occlusion. Left carotid system: Left common carotid artery tortuous proximally but widely patent to the bifurcation without stenosis. Mixed atheromatous plaque about the left bifurcation without hemodynamically significant stenosis. Left ICA tortuous but widely patent distally to the skull base without stenosis, dissection or occlusion. Vertebral arteries: Both of the vertebral arteries arise from the subclavian arteries. Vertebral arteries widely patent within the neck without stenosis, dissection, or occlusion. Skeleton: No acute osseus abnormality. No worrisome lytic or blastic osseous lesions. Moderate cervical spondylolysis at C5-6 and C6-7. Other neck: Visualized soft tissues of the neck demonstrate no acute finding. Salivary glands normal. Thyroid normal. No adenopathy. Upper chest: Visualized upper chest within normal limits. Sequelae of prior CABG partially visualized. Postsurgical changes and/or scarring noted at the lung apices bilaterally. Review of the MIP images confirms the above findings CTA HEAD FINDINGS Anterior circulation: Petrous segments widely patent bilaterally. Moderate to advanced atheromatous change throughout the carotid siphons bilaterally. Associated moderate approximate 50% stenosis at the  supraclinoid left ICA, with moderate to severe approximate 75% stenosis at the supraclinoid right ICA. ICA termini patent bilaterally. Left A1 dominant and widely patent. Right A1 diminutive with severe proximal right A1 stenosis (series 11, image 78). Normal anterior communicating artery. Short-segment severe distal left A2 stenosis (series 11, image 61). Anterior cerebral arteries otherwise patent to its distal aspects. M1 segments patent without high-grade stenosis or occlusion. Normal MCA bifurcations. Short-segment severe proximal left M2 stenosis (series 10, image 100).  No proximal M2 occlusion. Distal MCA branches demonstrate multifocal atheromatous irregularity but are well perfused and fairly symmetric. Posterior circulation: Vertebral arteries patent to the vertebrobasilar junction without stenosis. Posterior inferior cerebral arteries patent bilaterally. Basilar patent to its distal aspect without stenosis. Superior cerebral arteries patent bilaterally. Left PCA supplied via the basilar. Fetal type origin of the right PCA. Multifocal moderate to severe bilateral P2 stenoses, right worse than left. PCAs are patent to their distal aspects. Venous sinuses: Patent. Anatomic variants: Fetal type origin of the right PCA. Hypoplastic right P1 segment. Delayed phase: No abnormal enhancement. Review of the MIP images confirms the above findings IMPRESSION: CT HEAD IMPRESSION 1. No acute intracranial abnormality. 2. Moderate cerebral atrophy with chronic small vessel ischemic disease. CTA HEAD AND NECK IMPRESSION 1. Negative CTA for emergent large vessel occlusion. 2. Moderate to advanced atherosclerotic change throughout the intracranial circulation with multifocal moderate to severe stenoses as detailed above. Changes most notable at the supraclinoid ICAs, right A1 segment, and bilateral P2 segments. 3. Mild atheromatous change about the carotid bifurcations without significant stenosis. No critical flow limiting  stenosis within the neck. Electronically Signed   By: Rise Mu M.D.   On: 08/27/2018 23:19   Ct Angio Neck W Or Wo Contrast  Result Date: 08/27/2018 CLINICAL DATA:  Initial evaluation for acute confusion, left-sided facial droop. EXAM: CT ANGIOGRAPHY HEAD AND NECK TECHNIQUE: Multidetector CT imaging of the head and neck was performed using the standard protocol during bolus administration of intravenous contrast. Multiplanar CT image reconstructions and MIPs were obtained to evaluate the vascular anatomy. Carotid stenosis measurements (when applicable) are obtained utilizing NASCET criteria, using the distal internal carotid diameter as the denominator. CONTRAST:  ISOVUE-370 IOPAMIDOL (ISOVUE-370) INJECTION 76% COMPARISON:  Prior CT from 08/23/2018. FINDINGS: CT HEAD FINDINGS Brain: Moderate cerebral atrophy with chronic small vessel ischemic disease. No acute intracranial hemorrhage. No acute large vessel territory infarct. No mass lesion, midline shift or mass effect. No hydrocephalus. No extra-axial fluid collection. Vascular: No hyperdense vessel. Scattered vascular calcifications noted within the carotid siphons. Skull: Scalp soft tissues and calvarium within normal limits. Sinuses: Partial opacification of the visualized right sphenoid sinus. Visualized paranasal sinuses and mastoid air cells are otherwise clear. Orbits: Globes and orbital soft tissues within normal limits. Review of the MIP images confirms the above findings CTA NECK FINDINGS Aortic arch: Visualized aortic arch of normal caliber with normal 3 vessel morphology. No hemodynamically significant stenosis about the origin of the great vessels. Visualized subclavian arteries widely patent. Right carotid system: Right common carotid artery tortuous proximally but widely patent to the bifurcation. Scattered mixed plaque about the right bifurcation without hemodynamically significant stenosis. Right ICA tortuous but widely patent  to the skull base without stenosis, dissection, or occlusion. Left carotid system: Left common carotid artery tortuous proximally but widely patent to the bifurcation without stenosis. Mixed atheromatous plaque about the left bifurcation without hemodynamically significant stenosis. Left ICA tortuous but widely patent distally to the skull base without stenosis, dissection or occlusion. Vertebral arteries: Both of the vertebral arteries arise from the subclavian arteries. Vertebral arteries widely patent within the neck without stenosis, dissection, or occlusion. Skeleton: No acute osseus abnormality. No worrisome lytic or blastic osseous lesions. Moderate cervical spondylolysis at C5-6 and C6-7. Other neck: Visualized soft tissues of the neck demonstrate no acute finding. Salivary glands normal. Thyroid normal. No adenopathy. Upper chest: Visualized upper chest within normal limits. Sequelae of prior CABG partially visualized. Postsurgical changes and/or scarring noted  at the lung apices bilaterally. Review of the MIP images confirms the above findings CTA HEAD FINDINGS Anterior circulation: Petrous segments widely patent bilaterally. Moderate to advanced atheromatous change throughout the carotid siphons bilaterally. Associated moderate approximate 50% stenosis at the supraclinoid left ICA, with moderate to severe approximate 75% stenosis at the supraclinoid right ICA. ICA termini patent bilaterally. Left A1 dominant and widely patent. Right A1 diminutive with severe proximal right A1 stenosis (series 11, image 78). Normal anterior communicating artery. Short-segment severe distal left A2 stenosis (series 11, image 61). Anterior cerebral arteries otherwise patent to its distal aspects. M1 segments patent without high-grade stenosis or occlusion. Normal MCA bifurcations. Short-segment severe proximal left M2 stenosis (series 10, image 100). No proximal M2 occlusion. Distal MCA branches demonstrate multifocal  atheromatous irregularity but are well perfused and fairly symmetric. Posterior circulation: Vertebral arteries patent to the vertebrobasilar junction without stenosis. Posterior inferior cerebral arteries patent bilaterally. Basilar patent to its distal aspect without stenosis. Superior cerebral arteries patent bilaterally. Left PCA supplied via the basilar. Fetal type origin of the right PCA. Multifocal moderate to severe bilateral P2 stenoses, right worse than left. PCAs are patent to their distal aspects. Venous sinuses: Patent. Anatomic variants: Fetal type origin of the right PCA. Hypoplastic right P1 segment. Delayed phase: No abnormal enhancement. Review of the MIP images confirms the above findings IMPRESSION: CT HEAD IMPRESSION 1. No acute intracranial abnormality. 2. Moderate cerebral atrophy with chronic small vessel ischemic disease. CTA HEAD AND NECK IMPRESSION 1. Negative CTA for emergent large vessel occlusion. 2. Moderate to advanced atherosclerotic change throughout the intracranial circulation with multifocal moderate to severe stenoses as detailed above. Changes most notable at the supraclinoid ICAs, right A1 segment, and bilateral P2 segments. 3. Mild atheromatous change about the carotid bifurcations without significant stenosis. No critical flow limiting stenosis within the neck. Electronically Signed   By: Rise Mu M.D.   On: 08/27/2018 23:19    EKG: Independently reviewed. Sinus rhythm, LVH with repolarization abnormality.   Assessment/Plan  1. Acute ischemic CVA; history of stroke  - Patient was in her usual state when she went to bed the night of 08/26/18 and woke 9/5 with dysarthria and ataxia  - CTA head and neck notable for moderate-severe intracranial stenoses, negative for emergent LVO, negative for acute intracranial abnormality  - Check MRI brain, echocardiogram, A1c, and fasting lipids  - Continue cardiac monitoring with frequent neuro checks, PT/OT/SLP evals,  keep NPO pending swallow screen  - Continue ASA 325 for now, request neurology consultation   2. Type II DM  - A1c was 7.7% in June  - Managed at home with metformin and Novolin 70/30 40 units BID  - Check CBG's, use Levemir BID and Novolog correctional while in hospital   3. CAD  - No anginal complaints  - Continue ASA, hold ACE initially in light of suspected acute ischemic CVA    4. Hypertension  - BP at goal  - Hold antihypertensives while evaluating for suspected acute ischemic CVA    5. Hypothyroidism - Continue Synthroid     ADDENDUM:  UA is suggestive of infection and there is a leukocytosis. She was not complaining of dysuria but had been experiencing confusion, possibly from stroke, but will send urine for culture and treat with Rocephin.    DVT prophylaxis: Lovenox  Code Status: Full  Family Communication: Son was updated in ED  Consults called: None Admission status: Observation     Briscoe Deutscher, MD [facility] Pager [phone]  If 7PM-7AM, please contact night-coverage [email or web address] Password [email or web address]  08/28/2018, 1:13 AM

## 2018-08-28 NOTE — Progress Notes (Signed)
CLORIS AUST is a 82 y.o. female with medical history significant for coronary artery disease status post remote CABG, history of CVA, COPD, hypertension, and insulin-dependent diabetes mellitus, now presenting to the emergency department for evaluation of speech and gait disturbance.  Upon arrival to the ED, patient is found to be saturating well on room air, and with vitals otherwise normal.  EKG features a sinus rhythm with LVH and repolarization abnormality.  CTA head and neck is negative for acute intracranial abnormality or emergent large vessel occlusion, but notable for moderate to severe intracranial stenoses.    She was admitted for evaluation of stroke.  Underwent MRI brain which did not show any acute stroke.  CT angio of the head and neck showed. Negative CTA for emergent large vessel occlusion. 2. Moderate to advanced atherosclerotic change throughout the intracranial circulation with multifocal moderate to severe stenoses as detailed above. Changes most notable at the supraclinoid ICAs, right A1 segment, and bilateral P2 segments.   Neurology consulted for recommendations.  Therapy evals recommending SNF.  Echocardiogram negative for thrombus.    Kathlen Mody, MD [phone]

## 2018-08-28 NOTE — Evaluation (Signed)
Physical Therapy Evaluation Patient Details Name: [name] [name] MRN: [id number] DOB: 01-19-1936 Today's Date: 08/28/2018   History of Present Illness  [name] [name] is a 82 y.o. female with medical history significant for coronary artery disease status post remote CABG, history of CVA, COPD, hypertension, and insulin-dependent diabetes mellitus, now presenting to the emergency department for evaluation of speech and gait disturbance.  Patient reportedly went to bed in her usual state the night of 08/26/2018, but woke the following morning with balance difficulty, unable to walk without assistance, and was noted by family to have some confusion and slurred speech.  Patient has been experiencing a right-sided headache.  She fell several days ago and was evaluated in the emergency department at that time with negative head CT.  She reports a history of stroke, but denies any residual deficits.  Her deficits have seemed to improve and confusion seems to have resolved, but she continues to have difficulty ambulating and subtle dysarthria that was not present when she went to bed on 08/26/2018 per report of family.    Clinical Impression  Patient very unsteady on feet and at risk for falls due mostly to incoordination of BLE when taking steps, frequent scissoring of legs, veering left/right, patient also very lethargic from lack of sleep per patient.  Patient will benefit from continued physical therapy in hospital and recommended venue below to increase strength, balance, endurance for safe ADLs and gait.    Follow Up Recommendations SNF;Supervision/Assistance - 24 hour    Equipment Recommendations  None recommended by PT    Recommendations for Other Services       Precautions / Restrictions Precautions Precautions: Fall Restrictions Weight Bearing Restrictions: No      Mobility  Bed Mobility Overal bed mobility: Needs Assistance Bed Mobility: Supine to Sit     Supine to sit: Min  assist;Mod assist     General bed mobility comments: had difficulty sitting up with bed flat, had to use bed rail and assistance  Transfers Overall transfer level: Needs assistance Equipment used: 1 person hand held assist;Rolling walker (2 wheeled);None Transfers: Sit to/from [employer] Sit to Stand: Min assist;Mod assist Stand pivot transfers: Min assist;Mod assist       General transfer comment: very unsteady on feet  Ambulation/Gait Ambulation/Gait assistance: Mod assist Gait Distance (Feet): 18 Feet   Gait Pattern/deviations: Decreased step length - right;Decreased step length - left;Decreased stride length;Scissoring Gait velocity: slow   General Gait Details: slow unsteady labored cadence with frequent scissoring of legs and difficulty making turns, limited due to fatigue/lethargy  Stairs            Wheelchair Mobility    Modified Rankin (Stroke Patients Only)       Balance Overall balance assessment: Needs assistance Sitting-balance support: Feet supported;No upper extremity supported Sitting balance-Leahy Scale: Good     Standing balance support: No upper extremity supported;During functional activity Standing balance-Leahy Scale: Poor Standing balance comment: fair/poor using RW                             Pertinent Vitals/Pain Pain Assessment: 0-10 Pain Score: 8  Pain Location: chronic low back Pain Descriptors / Indicators: Discomfort;Sore Pain Intervention(s): Limited activity within patient's tolerance;Monitored during session    Home Living Family/patient expects to be discharged to:: Private residence Living Arrangements: Spouse/significant other;Children Available Help at Discharge: Family;Available PRN/intermittently Type of Home: House Home Access: Stairs to enter Entrance  Stairs-Rails: Right;Left;Can reach both Entrance Stairs-Number of Steps: 2 Home Layout: One level Home Equipment: Walker - 4  wheels;Shower seat;Cane - single point      Prior Function Level of Independence: Independent with assistive device(s)         Comments: community ambulator with SPC PRN, drives     Hand Dominance   Dominant Hand: Right    Extremity/Trunk Assessment   Upper Extremity Assessment Upper Extremity Assessment: Defer to OT evaluation RUE Deficits / Details: BUE MMT 3/5 in all shoulder and elbow range.  RUE Sensation: (Unable to assess) RUE Coordination: decreased fine motor;decreased gross motor LUE Deficits / Details: Impaired gross grasp left hand. LUE Sensation: (unable to assess) LUE Coordination: decreased fine motor;decreased gross motor    Lower Extremity Assessment Lower Extremity Assessment: Generalized weakness;RLE deficits/detail RLE Deficits / Details: grossly 4/5 LLE Deficits / Details: grossly 3+/5    Cervical / Trunk Assessment Cervical / Trunk Assessment: Normal  Communication   Communication: No difficulties  Cognition Arousal/Alertness: Lethargic Behavior During Therapy: Flat affect Overall Cognitive Status: No family/caregiver present to determine baseline cognitive functioning                                        General Comments      Exercises     Assessment/Plan    PT Assessment Patient needs continued PT services  PT Problem List Decreased strength;Decreased activity tolerance;Decreased balance;Decreased mobility       PT Treatment Interventions Gait training;Stair training;Functional mobility training;Therapeutic activities;Patient/family education    PT Goals (Current goals can be found in the Care Plan section)  Acute Rehab PT Goals Patient Stated Goal: return home PT Goal Formulation: With patient Time For Goal Achievement: 09/11/18 Potential to Achieve Goals: Good    Frequency 7X/week   Barriers to discharge        Co-evaluation               AM-PAC PT "6 Clicks" Daily Activity  Outcome Measure  Difficulty turning over in bed (including adjusting bedclothes, sheets and blankets)?: A Little Difficulty moving from lying on back to sitting on the side of the bed? : A Lot Difficulty sitting down on and standing up from a chair with arms (e.g., wheelchair, bedside commode, etc,.)?: A Little Help needed moving to and from a bed to chair (including a wheelchair)?: A Little Help needed walking in hospital room?: A Lot Help needed climbing 3-5 steps with a railing? : A Lot 6 Click Score: 15    End of Session   Activity Tolerance: Patient tolerated treatment well;Patient limited by fatigue;Patient limited by lethargy Patient left: in bed;with call bell/phone within reach;with chair alarm set;Other (comment)(left with OT staff in room working with patient) Nurse Communication: Mobility status PT Visit Diagnosis: Unsteadiness on feet (R26.81);Other abnormalities of gait and mobility (R26.89);Muscle weakness (generalized) (M62.81)    Time: 1610-9604 PT Time Calculation (min) (ACUTE ONLY): 27 min   Charges:   PT Evaluation $PT Eval Moderate Complexity: 1 Mod PT Treatments $Therapeutic Activity: 23-37 mins        12:33 PM, 08/28/18 Ocie Bob, MPT Physical Therapist with [facility] [phone] office [phone] mobile phone

## 2018-08-28 NOTE — Care Management Obs Status (Signed)
MEDICARE OBSERVATION STATUS NOTIFICATION   Patient Details  Name: ANI PATENAUDE MRN: [id number] Date of Birth: [birth year]   Medicare Observation Status Notification Given:  Yes    Malcolm Metro, RN 08/28/2018, 12:56 PM

## 2018-08-28 NOTE — Evaluation (Signed)
Occupational Therapy Evaluation Patient Details Name: [name] [name] MRN: [id number] DOB: [birth year] Today's Date: 08/28/2018    History of Present Illness [name] [name] is a 82 y.o. female with medical history significant for coronary artery disease status post remote CABG, history of CVA, COPD, hypertension, and insulin-dependent diabetes mellitus, now presenting to the emergency department for evaluation of speech and gait disturbance.  Patient reportedly went to bed in her usual state the night of 08/26/2018, but woke the following morning with balance difficulty, unable to walk without assistance, and was noted by family to have some confusion and slurred speech.  Patient has been experiencing a right-sided headache.  She fell several days ago and was evaluated in the emergency department at that time with negative head CT.  She reports a history of stroke, but denies any residual deficits.  Her deficits have seemed to improve and confusion seems to have resolved, but she continues to have difficulty ambulating and subtle dysarthria that was not present when she went to bed on 08/26/2018 per report of family.   Clinical Impression   Patient currently working with PT and agreeable to allow OT to complete evaluation this session. Patient presents with decreased BUE strength, endurance, fine and gross motor coordination. There are possible vision deficits although due to fatigue level, patient was unable to remain awake to assess and therapist assisted her back to bed. Patient currently requires assistance for ADL completion and overall safety and SNF is recommended at discharge. Patient voices that she would like to return home and is agreeable to home health OT. Patient was encouraged to consider short term rehab prior to returning home. Pt will required acute OT services during hospitalization to focus on deficits mentioned above.     Follow Up Recommendations  SNF(Patient prefers to return home  and is agreeable to home health therapy although SNF is strongly encouraged. )    Equipment Recommendations  Tub/shower seat       Precautions / Restrictions Precautions Precautions: Fall Restrictions Weight Bearing Restrictions: No      Mobility Bed Mobility Overal bed mobility: Modified Independent                               ADL either performed or assessed with clinical judgement   ADL Overall ADL's : Needs assistance/impaired                       Lower Body Dressing Details (indicate cue type and reason): Unable to assess. Based on patient's functional level during evaluation she would required mod-max assist with lower body dressing.              Functional mobility during ADLs: Minimal assistance;Rolling walker;Cueing for sequencing;Cueing for safety       Vision   Additional Comments: Unable to assess vision due to patient's fatigue level. Unable to keep left eye open even when requested. PT reports that there is possible left side visual field deficits. Will test further when able.             Pertinent Vitals/Pain Pain Assessment: No/denies pain     Hand Dominance Right   Extremity/Trunk Assessment Upper Extremity Assessment Upper Extremity Assessment: Generalized weakness;LUE deficits/detail;RUE deficits/detail( ) RUE Deficits / Details: BUE MMT 3/5 in all shoulder and elbow range.  RUE Sensation: (Unable to assess) RUE Coordination: decreased fine motor;decreased gross motor LUE Deficits / Details: Impaired gross  grasp left hand. LUE Sensation: (unable to assess) LUE Coordination: decreased fine motor;decreased gross motor   Lower Extremity Assessment Lower Extremity Assessment: Defer to PT evaluation       Communication Communication Communication: Other (comment)(Due to fatigue. Patient's speech was slow although understandable)   Cognition Arousal/Alertness: Lethargic Behavior During Therapy: Flat affect Overall  Cognitive Status: No family/caregiver present to determine baseline cognitive functioning                    Home Living Family/patient expects to be discharged to:: Private residence Living Arrangements: Spouse/significant other;Children Available Help at Discharge: Family;Available PRN/intermittently(Per patient's report) Type of Home: House Home Access: Stairs to enter [employer] of Steps: 1 Entrance Stairs-Rails: None Home Layout: One level     Bathroom Shower/Tub: Chief Strategy Officer: Standard Bathroom Accessibility: Yes   Home Equipment: Environmental consultant - 4 wheels;Shower seat;Cane - single point      Lives With: Spouse    Prior Functioning/Environment Level of Independence: Independent with assistive device(s)        Comments: community ambulator with SPC, drives        OT Problem List: Decreased strength;Decreased knowledge of use of DME or AE;Decreased coordination;Decreased knowledge of precautions;Decreased activity tolerance;Decreased cognition;Impaired UE functional use;Decreased safety awareness;Impaired balance (sitting and/or standing)      OT Treatment/Interventions: Self-care/ADL training;Balance training;Therapeutic exercise;Neuromuscular education;Therapeutic activities;Energy conservation;Cognitive remediation/compensation;Visual/perceptual remediation/compensation;DME and/or AE instruction;Patient/family education    OT Goals(Current goals can be found in the care plan section) Acute Rehab OT Goals Patient Stated Goal: To go home OT Goal Formulation: With patient Time For Goal Achievement: 09/11/18 Potential to Achieve Goals: Good  OT Frequency: Min 2X/week    AM-PAC PT "6 Clicks" Daily Activity     Outcome Measure Help from another person eating meals?: A Little Help from another person taking care of personal grooming?: A Little Help from another person toileting, which includes using toliet, bedpan, or urinal?: A  Lot Help from another person bathing (including washing, rinsing, drying)?: A Lot Help from another person to put on and taking off regular upper body clothing?: A Lot Help from another person to put on and taking off regular lower body clothing?: A Lot 6 Click Score: 14   End of Session Equipment Utilized During Treatment: Gait belt;Rolling walker  Activity Tolerance: Patient limited by fatigue Patient left: in bed;with call bell/phone within reach;with bed alarm set  OT Visit Diagnosis: Muscle weakness (generalized) (M62.81)                Time: 2094-7096 OT Time Calculation (min): 14 min Charges:  OT General Charges $OT Visit: 1 Visit OT Evaluation $OT Eval Moderate Complexity: 1 [address], OTR/L,CBIS  [phone]   Essenmacher, Charisse March 08/28/2018, 9:35 AM

## 2018-08-28 NOTE — Plan of Care (Signed)
  Problem: Acute Rehab PT Goals(only PT should resolve) Goal: Pt Will Go Supine/Side To Sit Outcome: Progressing Flowsheets (Taken 08/28/2018 1234) Pt will go Supine/Side to Sit: with supervision Goal: Patient Will Transfer Sit To/From Stand Outcome: Progressing Flowsheets (Taken 08/28/2018 1234) Patient will transfer sit to/from stand: with min guard assist Goal: Pt Will Transfer Bed To Chair/Chair To Bed Outcome: Progressing Flowsheets (Taken 08/28/2018 1234) Pt will Transfer Bed to Chair/Chair to Bed: min guard assist Goal: Pt Will Ambulate Outcome: Progressing Flowsheets (Taken 08/28/2018 1234) Pt will Ambulate: 50 feet; with minimal assist; with rolling walker   12:35 PM, 08/28/18 Ocie Bob, MPT Physical Therapist with [facility] [phone] office [phone] mobile phone

## 2018-08-28 NOTE — Evaluation (Signed)
Speech Language Pathology Evaluation Patient Details Name: [name] [name] MRN: [id number] DOB: 02-17-1936 Today's Date: 08/28/2018 Time: 0511-0211 SLP Time Calculation (min) (ACUTE ONLY): 27 min  Problem List:  Patient Active Problem List   Diagnosis Date Noted  . Syncope   . Loss of consciousness (HCC) 06/10/2018  . Hyperglycemia 06/10/2018  . Bradycardia, sinus 06/10/2018  . Diastolic dysfunction 09/29/2017  . CAD (coronary artery disease) 09/28/2017  . Acute lower UTI 09/28/2017  . Ischemic stroke (HCC) 05/10/2017  . Acute ischemic stroke (HCC) 05/10/2017  . Stroke (HCC) 08/01/2016  . Dysarthria 03/18/2016  . Anemia of chronic disease 11/19/2015  . OSA (obstructive sleep apnea) 11/19/2015  . Left facial numbness 08/29/2015  . Gait instability 06/26/2015  . COPD exacerbation (HCC) 11/13/2014  . Weakness generalized 12/10/2013  . TIA (transient ischemic attack) 06/22/2012  . Left-sided headache 06/22/2012  . Noncompliance 06/22/2012  . Noncompliance with CPAP treatment 06/22/2012  . Psoriasis 01/07/2012  . Cerebrovascular disease   . Chronic back pain   . Chronic obstructive pulmonary disease (HCC)   . DM type 2 causing complication (HCC) 02/14/2010  . Obesity 02/14/2010  . Arteriosclerotic cardiovascular disease (ASCVD) 02/14/2010  . Hypothyroidism 02/06/2010  . Hyperlipidemia 02/06/2010  . Essential hypertension 02/06/2010  . Sleep apnea 02/06/2010   Past Medical History:  Past Medical History:  Diagnosis Date  . Arthritis   . CAD (coronary artery disease)    Multivessel status post CABG 2001  . Chronic obstructive pulmonary disease (HCC)   . DDD (degenerative disc disease), lumbar   . Diastolic CHF (HCC)   . Essential hypertension   . Hematuria   . History of CVA (cerebrovascular accident)    2013 - lacunar infarct right thalamus - residual left side of  lip numb  and left eye vision worse (which corrented lens implant from cataract extraction)  . History  of MI (myocardial infarction)    July 2001  . Hyperlipidemia   . Hypothyroidism   . Insulin dependent type 2 diabetes mellitus (HCC)   . Lesion of bladder   . Lower urinary tract symptoms (LUTS)   . OSA (obstructive sleep apnea)   . Peripheral neuropathy   . Psoriasis   . SUI (stress urinary incontinence, female)   . Varicose veins    Past Surgical History:  Past Surgical History:  Procedure Laterality Date  . APPENDECTOMY  age 41  . CARDIAC CATHETERIZATION  1991   No sig. cad  . CARDIAC CATHETERIZATION  07-01-2000   high grade in-stent restenosis  . CARDIAC CATHETERIZATION  04-30-2001   single native vessel cad/ graft patent  . CARDIAC CATHETERIZATION  02-13-2006  dr [place] cad with total occluded LAD  and  70% ostial of small ramus/  grafts patent/  normal ef  . CARDIAC CATHETERIZATION  01-20-2000  dr Clifton James   essentially unchanged;  severe single vessel cad with diffuse disease throughout CFX and RCA/ ef 66%;  chf with preserved LVSF  . CARDIOVASCULAR STRESS TEST  08-28-2011   DR ROTHBART   NEGATIVE NUCLEAR STUDY/  GLOBAL LVSF/  EF 65%  . CARPAL TUNNEL RELEASE Bilateral yrs ago  . CATARACT EXTRACTION W/ INTRAOCULAR LENS  IMPLANT, BILATERAL  2013  . CHOLECYSTECTOMY  1990  . COLONOSCOPY N/A 07/26/2015   Procedure: COLONOSCOPY;  Surgeon: Malissa Hippo, MD;  Location: [facility];  Service: Endoscopy;  Laterality: N/A;  1200  . CORONARY ANGIOPLASTY  11-13-1999   balloon angioplasty to mLAD , d2  of LAD  . CORONARY ANGIOPLASTY WITH STENT PLACEMENT  01-17-2000   PCI stenting to mLAD & D2 of LAD  . CORONARY ARTERY BYPASS GRAFT  07-02-2000   DR Tressie Stalker   SVG to diagonal, LIMA to LAD  . CYSTOSCOPY W/ RETROGRADES Bilateral 08/09/2014   Procedure: CYSTOSCOPY, BILATERAL RETROGRADE, HYDRODISTENSION, BLADDER BIOPSY WITH FULGERATION, INSTILL PYRIDIUM AND MARCAINE ;  Surgeon: Anner Crete, MD;  Location: [facility];  Service: Urology;  Laterality: Bilateral;   . KNEE ARTHROSCOPY Left 10-18-2003   SYNOVECTOMY  . LUMBAR SPINE SURGERY  x2    yrs ago  . ORIF HUMERUS FRACTURE Right 04-18-2008  . REPAIR FLEXOR TENDON HAND Right 09-20-2008   CARPI RADIALIS TENDON TRANSFER TO EXTENSOR TO INDEX, MIDDLE, RING , LITTLE FINGERS AND DIGITI MININI  . ROTATOR CUFF REPAIR Right 1995  . TOTAL ABDOMINAL HYSTERECTOMY W/ BILATERAL SALPINGOOPHORECTOMY  age 24  . TRANSTHORACIC ECHOCARDIOGRAM  12-12-2013   MILD LVH/  EF 60-65%/  GRADE I DIASTOLIC DYSFUNCTION/  MILD LAE   HPI:  [name] [name] is a 82 y.o. female with medical history significant for coronary artery disease status post remote CABG, history of CVA, COPD, hypertension, and insulin-dependent diabetes mellitus, now presenting to the emergency department for evaluation of speech and gait disturbance.  Patient reportedly went to bed in her usual state the night of 08/26/2018, but woke the following morning with balance difficulty, unable to walk without assistance, and was noted by family to have some confusion and slurred speech.  Patient has been experiencing a right-sided headache.  She fell several days ago and was evaluated in the emergency department at that time with negative head CT.  She reports a history of stroke, but denies any residual deficits.  Her deficits have seemed to improve and confusion seems to have resolved, but she continues to have difficulty ambulating and subtle dysarthria that was not present when she went to bed on 08/26/2018 per report of family.   Assessment / Plan / Recommendation Clinical Impression  Pt was provided speech language evaluation and patient presents with moderate cognitive impairment, mild word finding difficulties and mild dysarthria. Pt reported frustration with not "getting the right word out" reportedly yesterday she would call family members by the incorrect name. Portions of the MoCA and other clinical assessments were completed. Areas of need include recent recall,  executive functioning and problem solving. 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 Pt was educated of memory strategies and word finding strategies. Anticipate inability to manage and complete complex ADLs. Recommend 24 hr care with HH ST to enhace cognitive funcitoning and to implement cognitive strategies within the home environment. All further ST needs can be addressed at next level of care.    SLP Assessment  SLP Recommendation/Assessment: All further Speech Lanaguage Pathology  needs can be addressed in the next venue of care SLP Visit Diagnosis: Cognitive communication deficit (R41.841)    Follow Up Recommendations  Home health SLP;24 hour supervision/assistance          SLP Evaluation Cognition  Overall Cognitive Status: No family/caregiver present to determine baseline cognitive functioning Arousal/Alertness: Awake/alert Orientation Level: Oriented X4 Attention: Focused Focused Attention: Appears intact Memory: Impaired Memory Impairment: Retrieval deficit;Decreased short term memory Decreased Short Term Memory: Verbal basic Awareness: Appears intact Problem Solving: Impaired Problem Solving Impairment: Verbal basic Executive Function: Reasoning;Organizing;Decision Making Reasoning: Impaired Reasoning Impairment: Verbal basic Organizing: Impaired Organizing Impairment: Verbal basic Decision Making: Impaired Decision Making  Impairment: Verbal basic       Comprehension  Auditory Comprehension Overall Auditory Comprehension: Appears within functional limits for tasks assessed    Expression Verbal Expression Overall Verbal Expression: Impaired Initiation: Impaired Automatic Speech: Name;Social Response;Counting;Day of week;Month of year Repetition: No impairment Naming: Impairment Responsive: 76-100% accurate Convergent: 75-100% accurate Written Expression Dominant Hand: Right   Oral / Motor  Oral Motor/Sensory Function Overall Oral  Motor/Sensory Function: Within functional limits Motor Speech Articulation: Impaired Level of Impairment: Sentence Intelligibility: Intelligibility reduced Word: 75-100% accurate      Stace Peace H. Romie Levee, CCC-SLP Speech Language Pathologist    GO                    Georgetta Haber 08/28/2018, 10:42 AM

## 2018-08-28 NOTE — Progress Notes (Signed)
Unable to complete 0600 neuro check. Pt received ambien last night and is really drowsy this am. Can stay name and DOB but goes right back to sleep.

## 2018-08-28 NOTE — Plan of Care (Signed)
  Problem: Acute Rehab OT Goals (only OT should resolve) Goal: Pt. Will Perform Grooming Flowsheets (Taken 08/28/2018 0938) Pt Will Perform Grooming: with set-up; standing Goal: Pt. Will Perform Upper Body Bathing Flowsheets (Taken 08/28/2018 0938) Pt Will Perform Upper Body Bathing: with set-up; sitting Goal: Pt. Will Transfer To Toilet Flowsheets (Taken 08/28/2018 [phone]) Pt Will Transfer to Toilet: with supervision; ambulating; regular height toilet Goal: Pt/Caregiver Will Perform Home Exercise Program Flowsheets (Taken 08/28/2018 [phone]) Pt/caregiver will Perform Home Exercise Program: Increased strength; Both right and left upper extremity; With Supervision; With written HEP provided Goal: OT Additional ADL Goal #1 Flowsheets (Taken 08/28/2018 0938) Additional ADL Goal #1: Vision will be assessed to determine any visual deficits with education and HEP provided if needed.

## 2018-08-28 NOTE — Care Management (Signed)
Pt from home with TIA. Symptoms are significant weakness and pt noticed to have difficulty holding conversation, she is not confused but has trouble developing thoughts. PT recommends SNF. Pt has refused SNF and wants to go home with [facility]. She wants to use [facility]. Bonita Quin, [facility] rep, given referral. Pt expresses to this CM that she feels unsafe to go home, she will not have proper support. CM encouraged pt to go to [facility] but she feels she will never get back home. CM clarified with patient what [facility] is and expectations. Plan remains at this time for DC home but would not be surprised if pt changes her mind, CM made CSW aware.

## 2018-08-28 NOTE — Consult Note (Signed)
[place] A. Merlene Laughter, MD     [email or web address]          [name] [name] is an 82 y.o. female.   ASSESSMENT/PLAN: 1.  Resolving encephalopathy due to acute urinary tract infection: Continue with current care. 2.  Resolving gait impairment also due to acute urinary tract infection: Continue with current care as above.  Also with need physical therapy. 3.  Remote lacunar infarcts: Continue with aspirin 325. 4.  Extensive chronic ischemic white matter changes:   Patient is a 82 year old white female who presents with the relative acute onset of altered mental status, confusion, gait instability and ataxia.  The work-up has been significant for UTI.  The patient has had multiple focal neurological deficit in the past as outlined below.  These have been associated with UTI although some have been associated with small vessel lacunar infarcts.  The patient reports that she still does not feel well although she has improved since being hospitalized overnight.  She reports still having residual gait instability and fatigue.  She has been able to feed herself after mproved overnight.  She has been given antibiotics and IV hydration.  She still reports having significant gait instability.  She does not report focal neurological deficits such as dysarthria, dysphagia, numbness or weakness.  She reports having some chronic mild gait problems and the pain, numbness and itching of the feet to the legs which she attributes to diabetes/diabetic neuropathy.  The review of systems is otherwise negative.    GENERAL: This is a pleasant female who is in no acute distress.  HEENT: No trauma appreciated.  Neck is supple.  ABDOMEN: soft  EXTREMITIES: No edema; there is mild erythema and evidence of irritation of the shin bilaterally.  BACK: Normal  SKIN: Normal by inspection.    MENTAL STATUS: Alert and oriented -year, month and location. Speech, language and cognition are generally  intact. Judgment and insight normal.   CRANIAL NERVES: Pupils are equal, round and reactive to light and accomodation; extra ocular movements are full, there is no significant nystagmus; visual fields are full; upper and lower facial muscles are normal in strength and symmetric, there is no flattening of the nasolabial folds; tongue is midline; uvula is midline; shoulder elevation is normal.  MOTOR: Normal tone, bulk and strength in in the legs but upper extremities is slightly weak 4+/5 on deltoid.  Handgrip 5.    COORDINATION: Left finger to nose is normal, right finger to nose is normal, No rest tremor; no intention tremor; no postural tremor; no bradykinesia.  REFLEXES: Deep tendon reflexes are symmetrical and normal.    SENSATION: Normal to light touch and temperature.              PRIOR NEURO NOTE 1. Likely tiny subacute infarct involving the pontine region on the left side. Risk factors hypertension, diabetes, previous infarcts, dyslipidemia, obesity and age. The infarct is lacunar type infarct and I would recommend continuing with antiplatelet agent aspirin. Continue with risk factor modification.  2. Recurrent UTI with some evidence of confusion likely due to the UTI.  3. Increased risk of risk cognitive impairment due to recurrent ischemic events and the significant increased white matter disease seen on imaging. Additional labs will be obtained such as thyroid function test and vitamin B12 level.    The patient is 82 year old white female who has had recurrent hospitalizations for focal neurological symptoms lately left-sided weakness and confusion mostly due to urinary tract infection. The patient presented  to the hospital with acute onset of right facial numbness. She reports she has had the symptoms for about a day. The patient denies symptoms involving the right leg or right upper extremity. She denies dysarthria or dysphasia. Appears that she may have had some  confusion which initially presented. Workup has been significant for a tiny infarct seen on imaging. She also has a UTI. The patient has had an infarct in the past. This is a distinct infarct at least once but she's had recurrent left-sided symptoms in the setting of UTI with negative imaging for repeat infarct. The patient is on aspirin to full dose since she reports being compliant with this. Patient denies any symptoms of the left upper extremity or left leg. She does have episodic chest pain but not during this hospitalization a before presented to the hospital. She denies any loss of consciousness or alteration of consciousness. Review of systems is essentially unrevealing other than as stated above.      Blood pressure (!) 150/65, pulse (!) 54, temperature 98.1 F (36.7 C), temperature source Oral, resp. rate 18, SpO2 95 %.  Past Medical History:  Diagnosis Date  . Arthritis   . CAD (coronary artery disease)    Multivessel status post CABG 2001  . Chronic obstructive pulmonary disease ([place])   . DDD (degenerative disc disease), lumbar   . Diastolic CHF ([place])   . Essential hypertension   . Hematuria   . History of CVA (cerebrovascular accident)    2013 - lacunar infarct right thalamus - residual left side of  lip numb  and left eye vision worse (which corrented lens implant from cataract extraction)  . History of MI (myocardial infarction)    July 2001  . Hyperlipidemia   . Hypothyroidism   . Insulin dependent type 2 diabetes mellitus ([place])   . Lesion of bladder   . Lower urinary tract symptoms (LUTS)   . OSA (obstructive sleep apnea)   . Peripheral neuropathy   . Psoriasis   . SUI (stress urinary incontinence, female)   . Varicose veins     Past Surgical History:  Procedure Laterality Date  . APPENDECTOMY  age 82  . CARDIAC CATHETERIZATION  1991   No sig. cad  . CARDIAC CATHETERIZATION  07-01-2000   high grade in-stent restenosis  . CARDIAC CATHETERIZATION  04-30-2001    single native vessel cad/ graft patent  . CARDIAC CATHETERIZATION  02-13-2006  dr Danae [place] cad with total occluded LAD  and  70% ostial of small ramus/  grafts patent/  normal ef  . CARDIAC CATHETERIZATION  01-20-2000  dr Angelena Form   essentially unchanged;  severe single vessel cad with diffuse disease throughout CFX and RCA/ ef 66%;  chf with preserved LVSF  . CARDIOVASCULAR STRESS TEST  08-28-2011   DR ROTHBART   NEGATIVE NUCLEAR STUDY/  GLOBAL LVSF/  EF 65%  . CARPAL TUNNEL RELEASE Bilateral yrs ago  . CATARACT EXTRACTION W/ INTRAOCULAR LENS  IMPLANT, BILATERAL  2013  . CHOLECYSTECTOMY  1990  . COLONOSCOPY N/A 07/26/2015   Procedure: COLONOSCOPY;  Surgeon: Rogene Houston, MD;  [facility];  Service: Endoscopy;  Laterality: N/A;  1200  . CORONARY ANGIOPLASTY  11-13-1999   balloon angioplasty to mLAD , d2 of LAD  . CORONARY ANGIOPLASTY WITH STENT PLACEMENT  01-17-2000   PCI stenting to mLAD & D2 of LAD  . CORONARY ARTERY BYPASS GRAFT  07-02-2000   DR Darylene Price   SVG  to diagonal, LIMA to LAD  . CYSTOSCOPY W/ RETROGRADES Bilateral 08/09/2014   Procedure: CYSTOSCOPY, BILATERAL RETROGRADE, HYDRODISTENSION, BLADDER BIOPSY WITH FULGERATION, INSTILL PYRIDIUM AND MARCAINE ;  Surgeon: Malka So, MD;  Location: [facility];  Service: Urology;  Laterality: Bilateral;  . KNEE ARTHROSCOPY Left 10-18-2003   SYNOVECTOMY  . LUMBAR SPINE SURGERY  x2    yrs ago  . ORIF HUMERUS FRACTURE Right 04-18-2008  . REPAIR FLEXOR TENDON HAND Right 09-20-2008   CARPI RADIALIS TENDON TRANSFER TO EXTENSOR TO INDEX, MIDDLE, RING , LITTLE FINGERS AND DIGITI MININI  . ROTATOR CUFF REPAIR Right 1995  . TOTAL ABDOMINAL HYSTERECTOMY W/ BILATERAL SALPINGOOPHORECTOMY  age 70  . TRANSTHORACIC ECHOCARDIOGRAM  12-12-2013   MILD LVH/  EF 37-34%/  GRADE I DIASTOLIC DYSFUNCTION/  MILD LAE    Family History  Problem Relation Age of Onset  . Hypertension Mother   . Stroke Mother   . Coronary  artery disease Unknown        Multiple first and second-degree relatives  . Aneurysm Unknown        Cerebral circulation  . Colon cancer Sister   . Stroke Sister   . Coronary artery disease Sister   . Clotting disorder Unknown        Children diagnosed with hypercoagulable state  . Coronary artery disease Brother     Social History:  reports that she has never smoked. She has never used smokeless tobacco. She reports that she does not drink alcohol or use drugs.  Allergies:  Allergies  Allergen Reactions  . Codeine Itching    Medications: Prior to Admission medications   Medication Sig Start Date End Date Taking? Authorizing Provider  amLODipine (NORVASC) 5 MG tablet Take 5 mg by mouth daily. 07/03/17  Yes [provider]  aspirin EC 325 MG tablet Take 325 mg by mouth daily.   Yes [provider]  cephALEXin (KEFLEX) 500 MG capsule Take 1 capsule (500 mg total) by mouth 3 (three) times daily. 06/12/18  Yes [name], Delfino Lovett, MD  ciprofloxacin (CIPRO) 250 MG tablet Take 1 tablet by mouth 2 (two) times daily. 08/18/18  Yes [provider]  Dextromethorphan-guaiFENesin (MUCUS RELIEF DM) 30-600 MG TB12 Take 1 tablet by mouth every 12 (twelve) hours. 08/21/18  Yes [provider]  gabapentin (NEURONTIN) 300 MG capsule Take 300 mg by mouth 2 (two) times daily. 09/18/17  Yes [provider]  insulin NPH-regular Human (NOVOLIN 70/30) (70-30) 100 UNIT/ML injection Inject 40 Units into the skin 2 (two) times daily.   Yes [provider]  ipratropium-albuterol (DUONEB) 0.5-2.5 (3) MG/3ML SOLN Inhale 3 mLs into the lungs 4 (four) times daily as needed. 09/19/17  Yes [provider]  levothyroxine (SYNTHROID, LEVOTHROID) 25 MCG tablet Take 25 mcg by mouth daily.    Yes [provider]  lisinopril (PRINIVIL,ZESTRIL) 10 MG tablet Take 10 mg by mouth daily.   Yes [provider]  metFORMIN (GLUCOPHAGE) 500 MG tablet Take 500  mg by mouth 2 (two) times daily with a meal.     Yes [provider]  HYDROcodone-acetaminophen (NORCO/VICODIN) 5-325 MG tablet Take 1 tablet by mouth every 4 (four) hours as needed. Patient not taking: Reported on 08/28/2018 06/29/18   Nat Christen, MD  nitroGLYCERIN (NITROSTAT) 0.4 MG SL tablet Place 0.4 mg under the tongue every 5 (five) minutes as needed for chest pain.     [provider]  oxyCODONE (OXY IR/ROXICODONE) 5 MG immediate release  tablet Take 1 tablet (5 mg total) by mouth every 4 (four) hours as needed (Moderate pain). 06/12/18   Lucia Gaskins, MD  predniSONE (DELTASONE) 20 MG tablet TAKE 3 TABS X 7 DAYS, THEN 2 TABS X 7 DAYS, THEN 1 TAB X 7 DAYS 08/21/18   [provider]  triamcinolone cream (KENALOG) 0.1 % APPLY TO AFFECTED AREA TWICE A DAY AS DIRECTED FOR FLARES 05/04/18   [provider]    Scheduled Meds: . aspirin EC  325 mg Oral Daily  . enoxaparin (LOVENOX) injection  40 mg Subcutaneous Q24H  . gabapentin  300 mg Oral BID  . insulin aspart  0-5 Units Subcutaneous QHS  . insulin aspart  0-9 Units Subcutaneous TID WC  . insulin detemir  10 Units Subcutaneous BID  . levothyroxine  25 mcg Oral QAC breakfast  . LORazepam  1-2 mg Intravenous Once   Continuous Infusions: . cefTRIAXone (ROCEPHIN)  IV Stopped (08/28/18 0304)   PRN Meds:.acetaminophen **OR** acetaminophen (TYLENOL) oral liquid 160 mg/5 mL **OR** acetaminophen, HYDROcodone-acetaminophen, ipratropium-albuterol, senna-docusate     Results for orders placed or performed during the hospital encounter of 08/27/18 (from the past 48 hour(s))  Protime-INR     Status: None   Collection Time: 08/27/18  9:32 PM  Result Value Ref Range   Prothrombin Time 13.1 11.4 - 15.2 seconds   INR 1.00     Comment: Performed at [facility], [address], [place], [place] [address]  APTT     Status: None   Collection Time: 08/27/18  9:32 PM  Result Value Ref Range   aPTT 29 24 - 36 seconds      Comment: Performed at [facility], [address], [place], [place] [address]  Comprehensive metabolic panel     Status: Abnormal   Collection Time: 08/27/18  9:32 PM  Result Value Ref Range   Sodium 140 135 - 145 mmol/L   Potassium 3.8 3.5 - 5.1 mmol/L   Chloride 105 98 - 111 mmol/L   CO2 30 22 - 32 mmol/L   Glucose, Bld 144 (H) 70 - 99 mg/dL   BUN 16 8 - 23 mg/dL   Creatinine, Ser 0.73 0.44 - 1.00 mg/dL   Calcium 8.9 8.9 - 10.3 mg/dL   Total Protein 6.5 6.5 - 8.1 g/dL   Albumin 3.4 (L) 3.5 - 5.0 g/dL   AST 10 (L) 15 - 41 U/L   ALT 9 0 - 44 U/L   Alkaline Phosphatase 81 38 - 126 U/L   Total Bilirubin 0.4 0.3 - 1.2 mg/dL   GFR calc non Af Amer >60 >60 mL/min   GFR calc Af Amer >60 >60 mL/min    Comment: (NOTE) The eGFR has been calculated using the CKD EPI equation. This calculation has not been validated in all clinical situations. eGFR's persistently <60 mL/min signify possible Chronic Kidney Disease.    Anion gap 5 5 - 15    Comment: Performed at [facility], [address] [place], [place] [address]  Ethanol     Status: None   Collection Time: 08/27/18  9:33 PM  Result Value Ref Range   Alcohol, Ethyl (B) <10 <10 mg/dL    Comment: (NOTE) Lowest detectable limit for serum alcohol is 10 mg/dL. For medical purposes only. Performed at [facility], [address], [place], [place] [address]   CBC     Status: Abnormal   Collection Time: 08/27/18  9:33 PM  Result Value Ref Range  WBC 13.6 (H) 4.0 - 10.5 K/uL   RBC 5.01 3.87 - 5.11 MIL/uL   Hemoglobin 12.8 12.0 - 15.0 g/dL   HCT 41.2 36.0 - 46.0 %   MCV 82.2 78.0 - 100.0 fL   MCH 25.5 (L) 26.0 - 34.0 pg   MCHC 31.1 30.0 - 36.0 g/dL   RDW 15.2 11.5 - 15.5 %   Platelets 281 150 - 400 K/uL    Comment: Performed at [facility], [address] [place], [place] [address]  Differential     Status: Abnormal   Collection Time: 08/27/18  9:33 PM  Result Value Ref Range   Neutrophils Relative % 63 %   Neutro Abs 8.5 (H)  1.7 - 7.7 K/uL   Lymphocytes Relative 23 %   Lymphs Abs 3.1 0.7 - 4.0 K/uL   Monocytes Relative 10 %   Monocytes Absolute 1.4 (H) 0.1 - 1.0 K/uL   Eosinophils Relative 4 %   Eosinophils Absolute 0.5 0.0 - 0.7 K/uL   Basophils Relative 0 %   Basophils Absolute 0.0 0.0 - 0.1 K/uL    Comment: Performed at [facility], [address], [place], [place] [address]  Hemoglobin A1c     Status: Abnormal   Collection Time: 08/27/18  9:33 PM  Result Value Ref Range   Hgb A1c MFr Bld 8.4 (H) 4.8 - 5.6 %    Comment: (NOTE) Pre diabetes:          5.7%-6.4% Diabetes:              >6.4% Glycemic control for   <7.0% adults with diabetes    Mean Plasma Glucose 194.38 mg/dL    Comment: Performed at [facility] [address], [place], [place] [address]  I-stat troponin, ED     Status: None   Collection Time: 08/27/18  9:36 PM  Result Value Ref Range   Troponin i, poc 0.01 0.00 - 0.08 ng/mL   Comment 3            Comment: Due to the release kinetics of cTnI, a negative result within the first hours of the onset of symptoms does not rule out myocardial infarction with certainty. If myocardial infarction is still suspected, repeat the test at appropriate intervals.   I-Stat Chem 8, ED     Status: Abnormal   Collection Time: 08/27/18  9:38 PM  Result Value Ref Range   Sodium 140 135 - 145 mmol/L   Potassium 3.7 3.5 - 5.1 mmol/L   Chloride 103 98 - 111 mmol/L   BUN 15 8 - 23 mg/dL   Creatinine, Ser 0.80 0.44 - 1.00 mg/dL   Glucose, Bld 138 (H) 70 - 99 mg/dL   Calcium, Ion 1.12 (L) 1.15 - 1.40 mmol/L   TCO2 25 22 - 32 mmol/L   Hemoglobin 13.3 12.0 - 15.0 g/dL   HCT 39.0 36.0 - 46.0 %  CBG monitoring, ED     Status: Abnormal   Collection Time: 08/27/18  9:48 PM  Result Value Ref Range   Glucose-Capillary 131 (H) 70 - 99 mg/dL  Urinalysis, Routine w reflex microscopic     Status: Abnormal   Collection Time: 08/27/18 11:59 PM  Result Value Ref Range   Color, Urine YELLOW YELLOW    APPearance HAZY (A) CLEAR   Specific Gravity, Urine >1.046 (H) 1.005 - 1.030   pH 6.0 5.0 - 8.0   Glucose, UA >=500 (A) NEGATIVE mg/dL   Hgb urine dipstick NEGATIVE  NEGATIVE   Bilirubin Urine NEGATIVE NEGATIVE   Ketones, ur NEGATIVE NEGATIVE mg/dL   Protein, ur NEGATIVE NEGATIVE mg/dL   Nitrite POSITIVE (A) NEGATIVE   Leukocytes, UA LARGE (A) NEGATIVE   RBC / HPF 6-10 0 - 5 RBC/hpf   WBC, UA >50 (H) 0 - 5 WBC/hpf   Bacteria, UA MANY (A) NONE SEEN   Squamous Epithelial / LPF 0-5 0 - 5   Mucus PRESENT    Budding Yeast PRESENT     Comment: Performed at [facility], [address], [place], [place] [address]  Glucose, capillary     Status: None   Collection Time: 08/28/18  2:21 AM  Result Value Ref Range   Glucose-Capillary 79 70 - 99 mg/dL  Lipid panel     Status: Abnormal   Collection Time: 08/28/18  5:34 AM  Result Value Ref Range   Cholesterol 173 0 - 200 mg/dL   Triglycerides 94 <150 mg/dL   HDL 41 >40 mg/dL   Total CHOL/HDL Ratio 4.2 RATIO   VLDL 19 0 - 40 mg/dL   LDL Cholesterol 113 (H) 0 - 99 mg/dL    Comment:        Total Cholesterol/HDL:CHD Risk Coronary Heart Disease Risk Table                     Men   Women  1/2 Average Risk   3.4   3.3  Average Risk       5.0   4.4  2 X Average Risk   9.6   7.1  3 X Average Risk  23.4   11.0        Use the calculated Patient Ratio above and the CHD Risk Table to determine the patient's CHD Risk.        ATP III CLASSIFICATION (LDL):  <100     mg/dL   Optimal  100-129  mg/dL   Near or Above                    Optimal  130-159  mg/dL   Borderline  160-189  mg/dL   High  >190     mg/dL   Very High Performed at [facility], [address] [place], [place] [address]   Glucose, capillary     Status: Abnormal   Collection Time: 08/28/18  8:29 AM  Result Value Ref Range   Glucose-Capillary 141 (H) 70 - 99 mg/dL   Comment 1 Notify RN    Comment 2 Document in Chart   Glucose, capillary     Status: Abnormal   Collection Time:  08/28/18 11:18 AM  Result Value Ref Range   Glucose-Capillary 231 (H) 70 - 99 mg/dL   Comment 1 Notify RN    Comment 2 Document in Chart     Studies/Results:  CTA HEAD NECK FINDINGS: CT HEAD FINDINGS  Brain: Moderate cerebral atrophy with chronic small vessel ischemic disease. No acute intracranial hemorrhage. No acute large vessel territory infarct. No mass lesion, midline shift or mass effect. No hydrocephalus. No extra-axial fluid collection.  Vascular: No hyperdense vessel. Scattered vascular calcifications noted within the carotid siphons.  Skull: Scalp soft tissues and calvarium within normal limits.  Sinuses: Partial opacification of the visualized right sphenoid sinus. Visualized paranasal sinuses and mastoid air cells are otherwise clear.  Orbits: Globes and orbital soft tissues within normal limits.  Review of the MIP images confirms the above findings  CTA NECK FINDINGS  Aortic arch: Visualized aortic arch of normal caliber with normal 3 vessel morphology. No hemodynamically significant stenosis about the origin of the great vessels. Visualized subclavian arteries widely patent.  Right carotid system: Right common carotid artery tortuous proximally but widely patent to the bifurcation. Scattered mixed plaque about the right bifurcation without hemodynamically significant stenosis. Right ICA tortuous but widely patent to the skull base without stenosis, dissection, or occlusion.  Left carotid system: Left common carotid artery tortuous proximally but widely patent to the bifurcation without stenosis. Mixed atheromatous plaque about the left bifurcation without hemodynamically significant stenosis. Left ICA tortuous but widely patent distally to the skull base without stenosis, dissection or occlusion.  Vertebral arteries: Both of the vertebral arteries arise from the subclavian arteries. Vertebral arteries widely patent within the neck without  stenosis, dissection, or occlusion.  Skeleton: No acute osseus abnormality. No worrisome lytic or blastic osseous lesions. Moderate cervical spondylolysis at C5-6 and C6-7.  Other neck: Visualized soft tissues of the neck demonstrate no acute finding. Salivary glands normal. Thyroid normal. No adenopathy.  Upper chest: Visualized upper chest within normal limits. Sequelae of prior CABG partially visualized. Postsurgical changes and/or scarring noted at the lung apices bilaterally.  Review of the MIP images confirms the above findings  CTA HEAD FINDINGS  Anterior circulation: Petrous segments widely patent bilaterally. Moderate to advanced atheromatous change throughout the carotid siphons bilaterally. Associated moderate approximate 50% stenosis at the supraclinoid left ICA, with moderate to severe approximate 75% stenosis at the supraclinoid right ICA. ICA termini patent bilaterally. Left A1 dominant and widely patent. Right A1 diminutive with severe proximal right A1 stenosis (series 11, image 78). Normal anterior communicating artery. Short-segment severe distal left A2 stenosis (series 11, image 61). Anterior cerebral arteries otherwise patent to its distal aspects. M1 segments patent without high-grade stenosis or occlusion. Normal MCA bifurcations. Short-segment severe proximal left M2 stenosis (series 10, image 100). No proximal M2 occlusion. Distal MCA branches demonstrate multifocal atheromatous irregularity but are well perfused and fairly symmetric.  Posterior circulation: Vertebral arteries patent to the vertebrobasilar junction without stenosis. Posterior inferior cerebral arteries patent bilaterally. Basilar patent to its distal aspect without stenosis. Superior cerebral arteries patent bilaterally. Left PCA supplied via the basilar. Fetal type origin of the right PCA. Multifocal moderate to severe bilateral P2 stenoses, right worse than left. PCAs are patent  to their distal aspects.  Venous sinuses: Patent.  Anatomic variants: Fetal type origin of the right PCA. Hypoplastic right P1 segment.  Delayed phase: No abnormal enhancement.  Review of the MIP images confirms the above findings  IMPRESSION: CT HEAD IMPRESSION  1. No acute intracranial abnormality. 2. Moderate cerebral atrophy with chronic small vessel ischemic disease.  CTA HEAD AND NECK IMPRESSION  1. Negative CTA for emergent large vessel occlusion. 2. Moderate to advanced atherosclerotic change throughout the intracranial circulation with multifocal moderate to severe stenoses as detailed above. Changes most notable at the supraclinoid ICAs, right A1 segment, and bilateral P2 segments. 3. Mild atheromatous change about the carotid bifurcations without significant stenosis. No critical flow limiting stenosis within the neck.      BRAIN MRI FINDINGS: Brain: Negative for acute infarct. Moderate atrophy and moderate chronic microvascular ischemic change in the white matter and pons. Negative for hemorrhage or mass  Image quality degraded by motion  Vascular: Normal arterial flow voids  Skull and upper cervical spine: Negative  Sinuses/Orbits: Negative  Other: None  IMPRESSION: No acute abnormality. Moderate atrophy and moderate chronic microvascular ischemic  change in the white matter and pons.   The brain MRI is reviewed in person.  No acute infarcts are noted.  There is marked global atrophy.  There is moderate confluent and deep white matter leukoencephalopathy.  No hemorrhages appreciated.  CTA shows evidence of stenosis of the PCAs especially on the right side and also right A1.   Erandi Lemma A. Merlene Laughter, M.D.  Diplomate, Tax adviser of Psychiatry and Neurology ( Neurology). 08/28/2018, 4:34 PM

## 2018-08-29 ENCOUNTER — Encounter (HOSPITAL_COMMUNITY): Payer: Self-pay

## 2018-08-29 LAB — GLUCOSE, CAPILLARY
GLUCOSE-CAPILLARY: 285 mg/dL — AB (ref 70–99)
Glucose-Capillary: 231 mg/dL — ABNORMAL HIGH (ref 70–99)
Glucose-Capillary: 260 mg/dL — ABNORMAL HIGH (ref 70–99)
Glucose-Capillary: 240 mg/dL — ABNORMAL HIGH (ref 70–99)

## 2018-08-29 MED ORDER — DIAZEPAM 2 MG PO TABS
1.0000 mg | ORAL_TABLET | Freq: Three times a day (TID) | ORAL | Status: DC | PRN
  Administered 2018-08-29 – 2018-08-30 (×2): 1 mg via ORAL
  Filled 2018-08-29 (×2): qty 1

## 2018-08-29 MED ORDER — PHENOL 1.4 % MT LIQD: 1.0000 | OROMUCOSAL | Status: DC | PRN

## 2018-08-29 MED ORDER — DM-GUAIFENESIN ER 30-600 MG PO TB12
1.0000 | ORAL_TABLET | Freq: Two times a day (BID) | ORAL | Status: DC | PRN
  Administered 2018-08-29 – 2018-08-31 (×3): 1 via ORAL
  Filled 2018-08-29 (×3): qty 1

## 2018-08-29 MED ORDER — IPRATROPIUM-ALBUTEROL 0.5-2.5 (3) MG/3ML IN SOLN
3.0000 mL | Freq: Four times a day (QID) | RESPIRATORY_TRACT | Status: DC
  Administered 2018-08-29 – 2018-09-01 (×13): 3 mL via RESPIRATORY_TRACT
  Filled 2018-08-29 (×13): qty 3

## 2018-08-29 NOTE — Progress Notes (Signed)
Physical Therapy Treatment Patient Details Name: [name] [name] MRN: [id number] DOB: 1936-04-07 Today's Date: 08/29/2018    History of Present Illness [name] [name] is a 82 y.o. female with medical history significant for coronary artery disease status post remote CABG, history of CVA, COPD, hypertension, and insulin-dependent diabetes mellitus, now presenting to the emergency department for evaluation of speech and gait disturbance.  Patient reportedly went to bed in her usual state the night of 08/26/2018, but woke the following morning with balance difficulty, unable to walk without assistance, and was noted by family to have some confusion and slurred speech.  Patient has been experiencing a right-sided headache.  She fell several days ago and was evaluated in the emergency department at that time with negative head CT.  She reports a history of stroke, but denies any residual deficits.  Her deficits have seemed to improve and confusion seems to have resolved, but she continues to have difficulty ambulating and subtle dysarthria that was not present when she went to bed on 08/26/2018 per report of family.    PT Comments    Patient is progressing with therapy and is highly motivated to participate. She continues to be unsteady on feet and at risk for falls due to poor coordination of Bil LE. Gait training today with visual cue for step pattern improved patient's gait with decreased scissoring of legs. She continues to veer left while ambulating and requires cues to maintain safe proximity to RW. She will continue to benefit from skilled physical therapy in hospital and recommended venue below to address impairments and improve independence with functional mobility and reduce fall risk. Acute PT will continue to follow.    Follow Up Recommendations  SNF;Supervision/Assistance - 24 hour     Equipment Recommendations  None recommended by PT    Recommendations for Other Services        Precautions / Restrictions Precautions Precautions: Fall Restrictions Weight Bearing Restrictions: No    Mobility  Bed Mobility Overal bed mobility: Needs Assistance Bed Mobility: Supine to Sit;Sit to Supine     Supine to sit: Supervision;HOB elevated Sit to supine: Supervision;HOB elevated   General bed mobility comments: HOB slightly elevated and use of bed rail   Transfers Overall transfer level: Needs assistance Equipment used: Rolling walker (2 wheeled) Transfers: Sit to/from Stand Sit to Stand: Min assist Stand pivot transfers: Min assist       General transfer comment: Patient unsteady and requires external support from RW upon standing, cannot perform sit to stand without UE assistance  Ambulation/Gait Ambulation/Gait assistance: Min assist Gait Distance (Feet): 30 Feet Assistive device: Rolling walker (2 wheeled) Gait Pattern/deviations: Decreased step length - right;Decreased step length - left;Decreased stride length Gait velocity: slow   General Gait Details: slow unsteady gait with tendency to vear left and poor management of RW. Gait training performed: Step pattern improved with decreased scissoring with cue to keep Rt foot on Rt side of line on floor/Lt foot on Lt side.        Balance Overall balance assessment: Needs assistance Sitting-balance support: Feet supported;No upper extremity supported Sitting balance-Leahy Scale: Good     Standing balance support: During functional activity;Bilateral upper extremity supported Standing balance-Leahy Scale: Fair Standing balance comment: fair using RW     Cognition Arousal/Alertness: Awake/alert Behavior During Therapy: WFL for tasks assessed/performed Overall Cognitive Status: Within Functional Limits for tasks assessed       Exercises General Exercises - Lower Extremity Long Arc Quad: Strengthening;Right;10 reps;Seated  Other Exercises Other Exercises: 2x 5 reps sit to stands at edge of bed         Pertinent Vitals/Pain Pain Assessment: No/denies pain           PT Goals (current goals can now be found in the care plan section) Acute Rehab PT Goals Patient Stated Goal: return home PT Goal Formulation: With patient Time For Goal Achievement: 09/11/18 Potential to Achieve Goals: Good Progress towards PT goals: Progressing toward goals    Frequency    7X/week      PT Plan Current plan remains appropriate       AM-PAC PT "6 Clicks" Daily Activity  Outcome Measure  Difficulty turning over in bed (including adjusting bedclothes, sheets and blankets)?: A Little Difficulty moving from lying on back to sitting on the side of the bed? : A Lot Difficulty sitting down on and standing up from a chair with arms (e.g., wheelchair, bedside commode, etc,.)?: A Little Help needed moving to and from a bed to chair (including a wheelchair)?: A Little Help needed walking in hospital room?: A Little Help needed climbing 3-5 steps with a railing? : A Lot 6 Click Score: 16    End of Session Equipment Utilized During Treatment: Gait belt Activity Tolerance: Patient tolerated treatment well Patient left: in bed;with call bell/phone within reach;with bed alarm set Nurse Communication: Mobility status PT Visit Diagnosis: Unsteadiness on feet (R26.81);Other abnormalities of gait and mobility (R26.89);Muscle weakness (generalized) (M62.81)     Time: 1610-9604 PT Time Calculation (min) (ACUTE ONLY): 24 min  Charges:  $Gait Training: 8-22 mins $Therapeutic Exercise: 8-22 mins                     [name] [name], PT, DPT Physical Therapist with [facility]  08/29/2018 10:49 AM

## 2018-08-29 NOTE — Progress Notes (Addendum)
Long discussion with patient about SNF rehab stay.  She is agreed to consider this.  Continue on recurrent UTI mild currently on Rocephin 1 g every 24 hours patient received Levaquin 750 p.o. daily for 6 days prior to admission she has reactive airway disease she is generally weak sores ambulation steadiness with chronic vertigo essentially I discussed her being unsafe to go home alone due to high risk of fall she is fearful of skilled nursing facilities.  Will discuss this again in a.m. with patient.  Continue Rocephin at present. [name] [name] KMM:[id number] DOB: 1936/12/03 DOA: 08/27/2018 PCP: Oval Linsey, MD   Physical Exam: Blood pressure (!) 147/62, pulse 62, temperature 98.3 F (36.8 C), temperature source Oral, resp. rate 20, SpO2 94 %.  Lungs show prolonged expiratory phase mild end expiratory wheeze scattered rhonchi no rales appreciable heart regular rhythm no S3-S4 no heaves thrills rubs abdomen soft nontender bowel sounds normoactive   Investigations:  No results found for this or any previous visit (from the past 240 hour(s)).   Basic Metabolic Panel: Recent Labs    08/27/18 2132 08/27/18 2138  NA 140 140  K 3.8 3.7  CL 105 103  CO2 30  --   GLUCOSE 144* 138*  BUN 16 15  CREATININE 0.73 0.80  CALCIUM 8.9  --    Liver Function Tests: Recent Labs    08/27/18 2132  AST 10*  ALT 9  ALKPHOS 81  BILITOT 0.4  PROT 6.5  ALBUMIN 3.4*     CBC: Recent Labs    08/27/18 2133 08/27/18 2138  WBC 13.6*  --   NEUTROABS 8.5*  --   HGB 12.8 13.3  HCT 41.2 39.0  MCV 82.2  --   PLT 281  --     Ct Angio Head W Or Wo Contrast  Result Date: 08/27/2018 CLINICAL DATA:  Initial evaluation for acute confusion, left-sided facial droop. EXAM: CT ANGIOGRAPHY HEAD AND NECK TECHNIQUE: Multidetector CT imaging of the head and neck was performed using the standard protocol during bolus administration of intravenous contrast. Multiplanar CT image reconstructions and MIPs were  obtained to evaluate the vascular anatomy. Carotid stenosis measurements (when applicable) are obtained utilizing NASCET criteria, using the distal internal carotid diameter as the denominator. CONTRAST:  ISOVUE-370 IOPAMIDOL (ISOVUE-370) INJECTION 76% COMPARISON:  Prior CT from 08/23/2018. FINDINGS: CT HEAD FINDINGS Brain: Moderate cerebral atrophy with chronic small vessel ischemic disease. No acute intracranial hemorrhage. No acute large vessel territory infarct. No mass lesion, midline shift or mass effect. No hydrocephalus. No extra-axial fluid collection. Vascular: No hyperdense vessel. Scattered vascular calcifications noted within the carotid siphons. Skull: Scalp soft tissues and calvarium within normal limits. Sinuses: Partial opacification of the visualized right sphenoid sinus. Visualized paranasal sinuses and mastoid air cells are otherwise clear. Orbits: Globes and orbital soft tissues within normal limits. Review of the MIP images confirms the above findings CTA NECK FINDINGS Aortic arch: Visualized aortic arch of normal caliber with normal 3 vessel morphology. No hemodynamically significant stenosis about the origin of the great vessels. Visualized subclavian arteries widely patent. Right carotid system: Right common carotid artery tortuous proximally but widely patent to the bifurcation. Scattered mixed plaque about the right bifurcation without hemodynamically significant stenosis. Right ICA tortuous but widely patent to the skull base without stenosis, dissection, or occlusion. Left carotid system: Left common carotid artery tortuous proximally but widely patent to the bifurcation without stenosis. Mixed atheromatous plaque about the left bifurcation without hemodynamically significant stenosis. Left  ICA tortuous but widely patent distally to the skull base without stenosis, dissection or occlusion. Vertebral arteries: Both of the vertebral arteries arise from the subclavian arteries.  Vertebral arteries widely patent within the neck without stenosis, dissection, or occlusion. Skeleton: No acute osseus abnormality. No worrisome lytic or blastic osseous lesions. Moderate cervical spondylolysis at C5-6 and C6-7. Other neck: Visualized soft tissues of the neck demonstrate no acute finding. Salivary glands normal. Thyroid normal. No adenopathy. Upper chest: Visualized upper chest within normal limits. Sequelae of prior CABG partially visualized. Postsurgical changes and/or scarring noted at the lung apices bilaterally. Review of the MIP images confirms the above findings CTA HEAD FINDINGS Anterior circulation: Petrous segments widely patent bilaterally. Moderate to advanced atheromatous change throughout the carotid siphons bilaterally. Associated moderate approximate 50% stenosis at the supraclinoid left ICA, with moderate to severe approximate 75% stenosis at the supraclinoid right ICA. ICA termini patent bilaterally. Left A1 dominant and widely patent. Right A1 diminutive with severe proximal right A1 stenosis (series 11, image 78). Normal anterior communicating artery. Short-segment severe distal left A2 stenosis (series 11, image 61). Anterior cerebral arteries otherwise patent to its distal aspects. M1 segments patent without high-grade stenosis or occlusion. Normal MCA bifurcations. Short-segment severe proximal left M2 stenosis (series 10, image 100). No proximal M2 occlusion. Distal MCA branches demonstrate multifocal atheromatous irregularity but are well perfused and fairly symmetric. Posterior circulation: Vertebral arteries patent to the vertebrobasilar junction without stenosis. Posterior inferior cerebral arteries patent bilaterally. Basilar patent to its distal aspect without stenosis. Superior cerebral arteries patent bilaterally. Left PCA supplied via the basilar. Fetal type origin of the right PCA. Multifocal moderate to severe bilateral P2 stenoses, right worse than left. PCAs are  patent to their distal aspects. Venous sinuses: Patent. Anatomic variants: Fetal type origin of the right PCA. Hypoplastic right P1 segment. Delayed phase: No abnormal enhancement. Review of the MIP images confirms the above findings IMPRESSION: CT HEAD IMPRESSION 1. No acute intracranial abnormality. 2. Moderate cerebral atrophy with chronic small vessel ischemic disease. CTA HEAD AND NECK IMPRESSION 1. Negative CTA for emergent large vessel occlusion. 2. Moderate to advanced atherosclerotic change throughout the intracranial circulation with multifocal moderate to severe stenoses as detailed above. Changes most notable at the supraclinoid ICAs, right A1 segment, and bilateral P2 segments. 3. Mild atheromatous change about the carotid bifurcations without significant stenosis. No critical flow limiting stenosis within the neck. Electronically Signed   By: Rise Mu M.D.   On: 08/27/2018 23:19   Ct Angio Neck W Or Wo Contrast  Result Date: 08/27/2018 CLINICAL DATA:  Initial evaluation for acute confusion, left-sided facial droop. EXAM: CT ANGIOGRAPHY HEAD AND NECK TECHNIQUE: Multidetector CT imaging of the head and neck was performed using the standard protocol during bolus administration of intravenous contrast. Multiplanar CT image reconstructions and MIPs were obtained to evaluate the vascular anatomy. Carotid stenosis measurements (when applicable) are obtained utilizing NASCET criteria, using the distal internal carotid diameter as the denominator. CONTRAST:  ISOVUE-370 IOPAMIDOL (ISOVUE-370) INJECTION 76% COMPARISON:  Prior CT from 08/23/2018. FINDINGS: CT HEAD FINDINGS Brain: Moderate cerebral atrophy with chronic small vessel ischemic disease. No acute intracranial hemorrhage. No acute large vessel territory infarct. No mass lesion, midline shift or mass effect. No hydrocephalus. No extra-axial fluid collection. Vascular: No hyperdense vessel. Scattered vascular calcifications noted  within the carotid siphons. Skull: Scalp soft tissues and calvarium within normal limits. Sinuses: Partial opacification of the visualized right sphenoid sinus. Visualized paranasal sinuses and mastoid air  cells are otherwise clear. Orbits: Globes and orbital soft tissues within normal limits. Review of the MIP images confirms the above findings CTA NECK FINDINGS Aortic arch: Visualized aortic arch of normal caliber with normal 3 vessel morphology. No hemodynamically significant stenosis about the origin of the great vessels. Visualized subclavian arteries widely patent. Right carotid system: Right common carotid artery tortuous proximally but widely patent to the bifurcation. Scattered mixed plaque about the right bifurcation without hemodynamically significant stenosis. Right ICA tortuous but widely patent to the skull base without stenosis, dissection, or occlusion. Left carotid system: Left common carotid artery tortuous proximally but widely patent to the bifurcation without stenosis. Mixed atheromatous plaque about the left bifurcation without hemodynamically significant stenosis. Left ICA tortuous but widely patent distally to the skull base without stenosis, dissection or occlusion. Vertebral arteries: Both of the vertebral arteries arise from the subclavian arteries. Vertebral arteries widely patent within the neck without stenosis, dissection, or occlusion. Skeleton: No acute osseus abnormality. No worrisome lytic or blastic osseous lesions. Moderate cervical spondylolysis at C5-6 and C6-7. Other neck: Visualized soft tissues of the neck demonstrate no acute finding. Salivary glands normal. Thyroid normal. No adenopathy. Upper chest: Visualized upper chest within normal limits. Sequelae of prior CABG partially visualized. Postsurgical changes and/or scarring noted at the lung apices bilaterally. Review of the MIP images confirms the above findings CTA HEAD FINDINGS Anterior circulation: Petrous segments  widely patent bilaterally. Moderate to advanced atheromatous change throughout the carotid siphons bilaterally. Associated moderate approximate 50% stenosis at the supraclinoid left ICA, with moderate to severe approximate 75% stenosis at the supraclinoid right ICA. ICA termini patent bilaterally. Left A1 dominant and widely patent. Right A1 diminutive with severe proximal right A1 stenosis (series 11, image 78). Normal anterior communicating artery. Short-segment severe distal left A2 stenosis (series 11, image 61). Anterior cerebral arteries otherwise patent to its distal aspects. M1 segments patent without high-grade stenosis or occlusion. Normal MCA bifurcations. Short-segment severe proximal left M2 stenosis (series 10, image 100). No proximal M2 occlusion. Distal MCA branches demonstrate multifocal atheromatous irregularity but are well perfused and fairly symmetric. Posterior circulation: Vertebral arteries patent to the vertebrobasilar junction without stenosis. Posterior inferior cerebral arteries patent bilaterally. Basilar patent to its distal aspect without stenosis. Superior cerebral arteries patent bilaterally. Left PCA supplied via the basilar. Fetal type origin of the right PCA. Multifocal moderate to severe bilateral P2 stenoses, right worse than left. PCAs are patent to their distal aspects. Venous sinuses: Patent. Anatomic variants: Fetal type origin of the right PCA. Hypoplastic right P1 segment. Delayed phase: No abnormal enhancement. Review of the MIP images confirms the above findings IMPRESSION: CT HEAD IMPRESSION 1. No acute intracranial abnormality. 2. Moderate cerebral atrophy with chronic small vessel ischemic disease. CTA HEAD AND NECK IMPRESSION 1. Negative CTA for emergent large vessel occlusion. 2. Moderate to advanced atherosclerotic change throughout the intracranial circulation with multifocal moderate to severe stenoses as detailed above. Changes most notable at the supraclinoid  ICAs, right A1 segment, and bilateral P2 segments. 3. Mild atheromatous change about the carotid bifurcations without significant stenosis. No critical flow limiting stenosis within the neck. Electronically Signed   By: Rise Mu M.D.   On: 08/27/2018 23:19   [name] Contrast  Result Date: 08/28/2018 CLINICAL DATA:  Confusion.  Left facial droop.  Recent fall. EXAM: MRI HEAD WITHOUT CONTRAST TECHNIQUE: Multiplanar, multiecho pulse sequences of the brain and surrounding structures were obtained without intravenous contrast. COMPARISON:  CT head 08/27/2018 FINDINGS:  Brain: Negative for acute infarct. Moderate atrophy and moderate chronic microvascular ischemic change in the white matter and pons. Negative for hemorrhage or mass Image quality degraded by motion Vascular: Normal arterial flow voids Skull and upper cervical spine: Negative Sinuses/Orbits: Negative Other: None IMPRESSION: No acute abnormality. Moderate atrophy and moderate chronic microvascular ischemic change in the white matter and pons. Electronically Signed   By: Marlan Palau M.D.   On: 08/28/2018 08:06      Medications:  Impression:  Principal Problem:   Acute ischemic stroke [facility]) Active Problems:   Hypothyroidism   DM type 2 causing complication (HCC)   Essential hypertension   Chronic back pain   CAD (coronary artery disease)   Acute lower UTI     Plan: Continue Rocephin.  Continue physical therapy for strengthening and ambulation patient will consider at skilled nursing facility rehab will culture urine or await cultures if they were drawn upon admission.  Continue DuoNeb nebulizers 4 times daily but scheduled not as needed.  Continue full dose aspirin daily  Consultants:   Procedures   Antibiotics: Rocephin 1 g every 24 hours         Time spent: 30 minutes   LOS: 0 days   Lailanie Hasley M   08/29/2018, 10:42 AM

## 2018-08-30 LAB — GLUCOSE, CAPILLARY
GLUCOSE-CAPILLARY: 192 mg/dL — AB (ref 70–99)
Glucose-Capillary: 307 mg/dL — ABNORMAL HIGH (ref 70–99)
Glucose-Capillary: 242 mg/dL — ABNORMAL HIGH (ref 70–99)
Glucose-Capillary: 169 mg/dL — ABNORMAL HIGH (ref 70–99)

## 2018-08-30 LAB — URINE CULTURE

## 2018-08-30 MED ORDER — BUDESONIDE 0.5 MG/2ML IN SUSP
0.5000 mg | Freq: Two times a day (BID) | RESPIRATORY_TRACT | Status: DC
  Administered 2018-08-30 – 2018-09-01 (×4): 0.5 mg via RESPIRATORY_TRACT
  Filled 2018-08-30 (×4): qty 2

## 2018-08-30 MED ORDER — PHENAZOPYRIDINE HCL 100 MG PO TABS
100.0000 mg | ORAL_TABLET | Freq: Three times a day (TID) | ORAL | Status: DC
  Administered 2018-08-30 – 2018-09-01 (×6): 100 mg via ORAL
  Filled 2018-08-30 (×6): qty 1

## 2018-08-30 MED ORDER — DIPHENHYDRAMINE HCL 25 MG PO CAPS
25.0000 mg | ORAL_CAPSULE | Freq: Once | ORAL | Status: AC
  Administered 2018-08-30: 25 mg via ORAL
  Filled 2018-08-30: qty 1

## 2018-08-30 MED ORDER — SODIUM CHLORIDE 0.9 % IV SOLN
2.0000 g | INTRAVENOUS | Status: DC
  Administered 2018-08-31 – 2018-09-01 (×2): 2 g via INTRAVENOUS
  Filled 2018-08-30: qty 2
  Filled 2018-08-30: qty 20
  Filled 2018-08-30: qty 2

## 2018-08-30 NOTE — Progress Notes (Signed)
Patient has E. coli which is resistant to Cipro which was her outpatient antibiotic it is sensitive to Septra however currently on Rocephin will increase 1 g to 2 g every 24 hours as she has significant dysuria still some bronchospasm noted will increase duo nebs to 4 times daily Miyla Toma Corlew ZGY:[id number] DOB: 26-Dec-1935 DOA: 08/27/2018 PCP: Oval Linsey, MD   Physical Exam: Blood pressure 131/87, pulse 64, temperature 98 F (36.7 C), resp. rate 18, height 5\' 4"  (1.626 m), weight 87.8 kg, SpO2 94 %.  Lungs show prolonged expiratory phase mild end expiratory wheeze scattered rhonchi diminished breath sounds at the bases no rales noted heart regular rhythm no S3-S4 no heaves thrills or rubs abdomen soft nontender bowel sounds normoactive   Investigations:  Recent Results (from the past 240 hour(s))  Culture, Urine     Status: Abnormal   Collection Time: 08/27/18 11:59 PM  Result Value Ref Range Status   Specimen Description   Final    URINE, RANDOM Performed at [facility], [address], [place], [place] [address]    Special Requests   Final    NONE Performed at [facility], [address] [place], [place] [address]    Culture 60,000 COLONIES/mL ESCHERICHIA COLI (A)  Final   Report Status 08/30/2018 FINAL  Final   Organism ID, Bacteria ESCHERICHIA COLI (A)  Final      Susceptibility   Escherichia coli - MIC*    AMPICILLIN 16 INTERMEDIATE Intermediate     CEFAZOLIN <=4 SENSITIVE Sensitive     CEFTRIAXONE <=1 SENSITIVE Sensitive     CIPROFLOXACIN >=4 RESISTANT Resistant     GENTAMICIN 4 SENSITIVE Sensitive     IMIPENEM <=0.25 SENSITIVE Sensitive     NITROFURANTOIN <=16 SENSITIVE Sensitive     TRIMETH/SULFA <=20 SENSITIVE Sensitive     AMPICILLIN/SULBACTAM 4 SENSITIVE Sensitive     PIP/TAZO 8 SENSITIVE Sensitive     Extended ESBL NEGATIVE Sensitive     * 60,000 COLONIES/mL ESCHERICHIA COLI     Basic Metabolic Panel: Recent Labs    08/27/18 2132 08/27/18 2138  NA  140 140  K 3.8 3.7  CL 105 103  CO2 30  --   GLUCOSE 144* 138*  BUN 16 15  CREATININE 0.73 0.80  CALCIUM 8.9  --    Liver Function Tests: Recent Labs    08/27/18 2132  AST 10*  ALT 9  ALKPHOS 81  BILITOT 0.4  PROT 6.5  ALBUMIN 3.4*     CBC: Recent Labs    08/27/18 2133 08/27/18 2138  WBC 13.6*  --   NEUTROABS 8.5*  --   HGB 12.8 13.3  HCT 41.2 39.0  MCV 82.2  --   PLT 281  --     No results found.    Medications:   Impression:  Principal Problem:   Acute ischemic stroke [facility]) Active Problems:   Hypothyroidism   DM type 2 causing complication (HCC)   Essential hypertension   Chronic back pain   CAD (coronary artery disease)   Acute lower UTI     Plan: Increase Rocephin to 2 g IV every 24 hours increase nebulized treatments to 4 times daily continue physical therapy for strengthening and ambulation  [place]   Procedures MRI/MRA   Antibiotics: Rocephin 2 g IV every 24 hours           Time spent: 30 minutes   LOS: 0 days   Braileigh Landenberger M   08/30/2018, 10:48 AM

## 2018-08-30 NOTE — Progress Notes (Signed)
Pt experiencing an extended period of coughing and shortness of breath. MD on call for [name] notified. Put in orders for additional breathing treatment and flutter valve. Patient oxygen saturations are WNL are 98% on room air.

## 2018-08-30 NOTE — Progress Notes (Signed)
Verbal order from [facility], MD that stroke vitals and neuro checks can be discontinued since neurology has seen the patient and had them cleared.

## 2018-08-30 NOTE — Progress Notes (Signed)
Physical Therapy Treatment Patient Details Name: [name] [name] MRN: [id number] DOB: [birth year] Today's Date: 08/30/2018    History of Present Illness [name] [name] is a 82 y.o. female with medical history significant for coronary artery disease status post remote CABG, history of CVA, COPD, hypertension, and insulin-dependent diabetes mellitus, now presenting to the emergency department for evaluation of speech and gait disturbance.  Patient reportedly went to bed in her usual state the night of 08/26/2018, but woke the following morning with balance difficulty, unable to walk without assistance, and was noted by family to have some confusion and slurred speech.  Patient has been experiencing a right-sided headache.  She fell several days ago and was evaluated in the emergency department at that time with negative head CT.  She reports a history of stroke, but denies any residual deficits.  Her deficits have seemed to improve and confusion seems to have resolved, but she continues to have difficulty ambulating and subtle dysarthria that was not present when she went to bed on 08/26/2018 per report of family.    PT Comments    Patient continues to progress with therapy and requires min assist to min guard for transfers and short bouts of ambulation. She remains unsteady and at risk for falls due to poor coordination of Bil LE and weakness. Gait training continued with visual cue for step pattern, heel to toe ambulation, and backwards ambulation with training for RW management. Patient continues to drift Rt/Lt during gait and requires min assist to realign/position RW. She complained of IV site irritation at EOS and RN was notified. She will continue to benefit from skilled physical therapy in hospital and recommended venue below to address impairments and improve independence with functional mobility and reduce fall risk. Acute PT will continue to follow.   Follow Up Recommendations   SNF;Supervision/Assistance - 24 hour     Equipment Recommendations  None recommended by PT    Recommendations for Other Services       Precautions / Restrictions Precautions Precautions: Fall Restrictions Weight Bearing Restrictions: No    Mobility  Bed Mobility Overal bed mobility: Needs Assistance Bed Mobility: Supine to Sit;Sit to Supine     Supine to sit: Supervision;HOB elevated Sit to supine: Supervision;HOB elevated   General bed mobility comments: HOB slightly elevated and use of bed rail   Transfers Overall transfer level: Needs assistance Equipment used: Rolling walker (2 wheeled) Transfers: Sit to/from Stand Sit to Stand: Min guard         General transfer comment: Patient unsteady, requires external support from RW, cannot perform sit to stand without UE assistance  Ambulation/Gait Ambulation/Gait assistance: Min assist Gait Distance (Feet): 45 Feet Assistive device: Rolling walker (2 wheeled) Gait Pattern/deviations: Decreased step length - right;Decreased step length - left;Decreased stride length Gait velocity: slow   General Gait Details: slow/unsteady gait with tendency to vear left/right this session. Continues to demonstrate poor management of RW. Gait training performed: heel to toe walking, step pattern with cue to keep Rt foot on Rt side of line on floor/Lt foot on Lt side, and backwards waling with cues for management of RW.       Balance Overall balance assessment: Needs assistance Sitting-balance support: Feet supported;No upper extremity supported Sitting balance-Leahy Scale: Good     Standing balance support: During functional activity;Bilateral upper extremity supported Standing balance-Leahy Scale: Fair Standing balance comment: fair using RW     Cognition Arousal/Alertness: Awake/alert Behavior During Therapy: WFL for tasks assessed/performed Overall  Cognitive Status: Within Functional Limits for tasks assessed        Exercises General Exercises - Lower Extremity Hip Flexion/Marching: Strengthening;AROM;Standing;Right;Left;5 reps;Other (comment)(2 sets with 2 second holds)        Pertinent Vitals/Pain Pain Assessment: No/denies pain           PT Goals (current goals can now be found in the care plan section) Acute Rehab PT Goals Patient Stated Goal: return home PT Goal Formulation: With patient Time For Goal Achievement: 09/11/18 Potential to Achieve Goals: Good Progress towards PT goals: Progressing toward goals    Frequency    7X/week      PT Plan Current plan remains appropriate       AM-PAC PT "6 Clicks" Daily Activity  Outcome Measure  Difficulty turning over in bed (including adjusting bedclothes, sheets and blankets)?: A Little Difficulty moving from lying on back to sitting on the side of the bed? : A Lot Difficulty sitting down on and standing up from a chair with arms (e.g., wheelchair, bedside commode, etc,.)?: A Little Help needed moving to and from a bed to chair (including a wheelchair)?: A Little Help needed walking in hospital room?: A Little Help needed climbing 3-5 steps with a railing? : A Lot 6 Click Score: 16    End of Session Equipment Utilized During Treatment: Gait belt Activity Tolerance: Patient tolerated treatment well Patient left: in bed;with call bell/phone within reach;with bed alarm set;with nursing/sitter in room Nurse Communication: Mobility status;Other (comment)(notified RN that patient is c/o itching at IV site) PT Visit Diagnosis: Unsteadiness on feet (R26.81);Other abnormalities of gait and mobility (R26.89);Muscle weakness (generalized) (M62.81)     Time: 3361-2244 PT Time Calculation (min) (ACUTE ONLY): 27 min  Charges:  $Gait Training: 8-22 mins $Therapeutic Activity: 8-22 mins                     Valentino Saxon, PT, DPT Physical Therapist with  [facility]  08/30/2018 1:34 PM

## 2018-08-30 NOTE — Progress Notes (Signed)
Received call by nursing staff regarding awful burning sensation while urinating; patient admitted with UTI. Pyridium TID started. Will follow clinical response. Continue current antibiotics.   Vassie Loll MD [phone]

## 2018-08-31 LAB — GLUCOSE, CAPILLARY
GLUCOSE-CAPILLARY: 187 mg/dL — AB (ref 70–99)
GLUCOSE-CAPILLARY: 310 mg/dL — AB (ref 70–99)
Glucose-Capillary: 346 mg/dL — ABNORMAL HIGH (ref 70–99)
Glucose-Capillary: 255 mg/dL — ABNORMAL HIGH (ref 70–99)

## 2018-08-31 MED ORDER — MAGNESIUM HYDROXIDE 400 MG/5ML PO SUSP
15.0000 mL | Freq: Once | ORAL | Status: AC
  Administered 2018-08-31: 15 mL via ORAL
  Filled 2018-08-31: qty 30

## 2018-08-31 MED ORDER — BISACODYL 5 MG PO TBEC
5.0000 mg | DELAYED_RELEASE_TABLET | Freq: Every day | ORAL | Status: DC | PRN
  Administered 2018-08-31 – 2018-09-01 (×2): 5 mg via ORAL
  Filled 2018-08-31 (×2): qty 1

## 2018-08-31 MED ORDER — INSULIN DETEMIR 100 UNIT/ML ~~LOC~~ SOLN
20.0000 [IU] | Freq: Two times a day (BID) | SUBCUTANEOUS | Status: DC
  Administered 2018-08-31 – 2018-09-01 (×2): 20 [IU] via SUBCUTANEOUS
  Filled 2018-08-31 (×4): qty 0.2

## 2018-08-31 MED ORDER — SULFAMETHOXAZOLE-TRIMETHOPRIM 800-160 MG PO TABS
1.0000 | ORAL_TABLET | Freq: Every day | ORAL | Status: DC
  Administered 2018-08-31 – 2018-09-01 (×2): 1 via ORAL
  Filled 2018-08-31 (×2): qty 1

## 2018-08-31 NOTE — [place] FL2 (Signed)
[place] MEDICAID FL2 LEVEL OF CARE SCREENING TOOL     IDENTIFICATION  Patient Name: [name] [name] Birthdate: [birth year] Sex: female Admission Date (Current Location): 08/27/2018  [facility] and [place] Number:  [employer] and Address:  [facility],  [address], [place] [address]      Provider Number: [id number]  Attending Physician Name and Address:  Oval Linsey, MD  Relative Name and Phone Number:       Current Level of Care: Other (Comment)(observation) Recommended Level of Care: Skilled Nursing Facility Prior Approval Number:    Date Approved/Denied:   PASRR Number: [id number])  Discharge Plan: SNF    Current Diagnoses: Patient Active Problem List   Diagnosis Date Noted  . Syncope   . Loss of consciousness (HCC) 06/10/2018  . Hyperglycemia 06/10/2018  . Bradycardia, sinus 06/10/2018  . Diastolic dysfunction 09/29/2017  . CAD (coronary artery disease) 09/28/2017  . Acute lower UTI 09/28/2017  . Ischemic stroke (HCC) 05/10/2017  . Acute ischemic stroke (HCC) 05/10/2017  . Stroke (HCC) 08/01/2016  . Dysarthria 03/18/2016  . Anemia of chronic disease 11/19/2015  . OSA (obstructive sleep apnea) 11/19/2015  . Left facial numbness 08/29/2015  . Gait instability 06/26/2015  . COPD exacerbation (HCC) 11/13/2014  . Weakness generalized 12/10/2013  . TIA (transient ischemic attack) 06/22/2012  . Left-sided headache 06/22/2012  . Noncompliance 06/22/2012  . Noncompliance with CPAP treatment 06/22/2012  . Psoriasis 01/07/2012  . Cerebrovascular disease   . Chronic back pain   . Chronic obstructive pulmonary disease (HCC)   . DM type 2 causing complication (HCC) 02/14/2010  . Obesity 02/14/2010  . Arteriosclerotic cardiovascular disease (ASCVD) 02/14/2010  . Hypothyroidism 02/06/2010  . Hyperlipidemia 02/06/2010  . Essential hypertension 02/06/2010  . Sleep apnea 02/06/2010    Orientation RESPIRATION BLADDER  Height & Weight        Normal Incontinent Weight: 193 lb 8 oz (87.8 kg) Height:  5\' 4"  (162.6 cm)  BEHAVIORAL SYMPTOMS/MOOD NEUROLOGICAL BOWEL NUTRITION STATUS      Continent Diet(heart healthy/carb modified)  AMBULATORY STATUS COMMUNICATION OF NEEDS Skin   Limited Assist Verbally Normal                       Personal Care Assistance Level of Assistance  Bathing, Feeding, Dressing Bathing Assistance: Limited assistance Feeding assistance: Independent Dressing Assistance: Limited assistance     Functional Limitations Info  Sight, Hearing, Speech Sight Info: Adequate Hearing Info: Adequate Speech Info: Adequate    SPECIAL CARE FACTORS FREQUENCY  PT (By licensed PT)     PT Frequency: 5x/week              Contractures Contractures Info: Not present    Additional Factors Info  Code Status, Allergies Code Status Info: Full code Allergies Info: Codeine           Current Medications (08/31/2018):  This is the current hospital active medication list Current Facility-Administered Medications  Medication Dose Route Frequency Provider Last Rate Last Dose  . acetaminophen (TYLENOL) tablet 650 mg  650 mg Oral Q4H PRN Opyd, Lavone Neri, MD   650 mg at 08/29/18 2205   Or  . acetaminophen (TYLENOL) solution 650 mg  650 mg Per Tube Q4H PRN Opyd, Lavone Neri, MD       Or  . acetaminophen (TYLENOL) suppository 650 mg  650 mg Rectal Q4H PRN Opyd, Lavone Neri, MD      . aspirin EC tablet 325 mg  325 mg Oral Daily Opyd, Lavone Neri, MD   325 mg at 08/31/18 0946  . budesonide (PULMICORT) nebulizer solution 0.5 mg  0.5 mg Nebulization BID Vassie Loll, MD   0.5 mg at 08/31/18 0813  . cefTRIAXone (ROCEPHIN) 2 g in sodium chloride 0.9 % 100 mL IVPB  2 g Intravenous Q24H Oval Linsey, MD   Stopped at 08/31/18 0204  . dextromethorphan-guaiFENesin (MUCINEX DM) 30-600 MG per 12 hr tablet 1 tablet  1 tablet Oral BID PRN Macon Large, NP   1 tablet at 08/30/18 1657  . diazepam  (VALIUM) tablet 1 mg  1 mg Oral Q8H PRN Vassie Loll, MD   1 mg at 08/30/18 2321  . enoxaparin (LOVENOX) injection 40 mg  40 mg Subcutaneous Q24H Opyd, Lavone Neri, MD   40 mg at 08/31/18 0946  . gabapentin (NEURONTIN) capsule 300 mg  300 mg Oral BID Opyd, Lavone Neri, MD   300 mg at 08/31/18 0946  . HYDROcodone-acetaminophen (NORCO/VICODIN) 5-325 MG per tablet 1-2 tablet  1-2 tablet Oral Q4H PRN Opyd, Lavone Neri, MD   2 tablet at 08/31/18 0946  . insulin aspart (novoLOG) injection 0-5 Units  0-5 Units Subcutaneous QHS Briscoe Deutscher, MD   3 Units at 08/29/18 2209  . insulin aspart (novoLOG) injection 0-9 Units  0-9 Units Subcutaneous TID WC Opyd, Lavone Neri, MD   2 Units at 08/31/18 0750  . insulin detemir (LEVEMIR) injection 10 Units  10 Units Subcutaneous BID Briscoe Deutscher, MD   10 Units at 08/31/18 0946  . ipratropium-albuterol (DUONEB) 0.5-2.5 (3) MG/3ML nebulizer solution 3 mL  3 mL Inhalation QID Oval Linsey, MD   3 mL at 08/31/18 0803  . levothyroxine (SYNTHROID, LEVOTHROID) tablet 25 mcg  25 mcg Oral QAC breakfast Opyd, Lavone Neri, MD   25 mcg at 08/31/18 0749  . LORazepam (ATIVAN) injection 1-2 mg  1-2 mg Intravenous Once Kathlen Mody, MD      . phenazopyridine (PYRIDIUM) tablet 100 mg  100 mg Oral TID WC Vassie Loll, MD   100 mg at 08/31/18 0750  . phenol (CHLORASEPTIC) mouth spray 1 spray  1 spray Mouth/Throat PRN [name], Richard, MD      . senna-docusate (Senokot-S) tablet 1 tablet  1 tablet Oral QHS PRN Opyd, Lavone Neri, MD   1 tablet at 08/30/18 2237  . zolpidem (AMBIEN) tablet 5 mg  5 mg Oral QHS PRN Macon Large, NP   5 mg at 08/30/18 2244     Discharge Medications: Please see discharge summary for a list of discharge medications.  Relevant Imaging Results:  Relevant Lab Results:   Additional Information SSN: [id number]  Charmagne Buhl, Juleen China, LCSW

## 2018-08-31 NOTE — Progress Notes (Signed)
Inpatient Diabetes Program Recommendations  AACE/ADA: New Consensus Statement on Inpatient Glycemic Control (2019)  Target Ranges:  Prepandial:   less than 140 mg/dL      Peak postprandial:   less than 180 mg/dL (1-2 hours)      Critically ill patients:  140 - 180 mg/dL   Results for MAKINLEIGH, TILDEN (MRN [id number]) as of 08/31/2018 11:58  Ref. Range 08/30/2018 07:58 08/30/2018 11:44 08/30/2018 16:53 08/30/2018 22:32 08/31/2018 07:43 08/31/2018 11:27  Glucose-Capillary Latest Ref Range: 70 - 99 mg/dL 716 (H) 967 (H) 893 (H) 169 (H) 187 (H) 346 (H)   Review of Glycemic Control  Diabetes history: DM2 Outpatient Diabetes medications: 70/30 40 units BID, Metformin 500 mg BID Current orders for Inpatient glycemic control: Levemir 10 units BID, Novolog 0-9 units TID with meals, Novolog 0-5 units QHS  Inpatient Diabetes Program Recommendations:  Insulin - Basal: Please consider changing Levemir to 15 units QAM and continue Levemir 10 units QHS. Insulin - Meal Coverage: Please consider ordering Novolog 3 units TID with meals for meal coverage if patient eats at least 50% of meals.  Thanks, Orlando Penner, RN, MSN, CDE Diabetes Coordinator Inpatient Diabetes Program [phone] (Team Pager from 8am to 5pm)

## 2018-08-31 NOTE — Progress Notes (Signed)
Patient will have outpatient physical therapy and home health aide she promises to work hard in physical therapy for reconditioning.  Her bladder grew out cultures which are sensitive to Rocephin 2 separate due to the chronicity of her UTIs and recurrence I will add Septra DS once a day for suppressive therapy we will start today to observe her reactions as she stated she had some problem with sulfa but it was not a true allergy she will most likely be discharged tomorrow [name] [name] PNT:[id number] DOB: 06-Jun-1936 DOA: 08/27/2018 PCP: Oval Linsey, MD   Physical Exam: Blood pressure 122/64, pulse 70, temperature 98.5 F (36.9 C), temperature source Oral, resp. rate 20, height 5\' 4"  (1.626 [name]), weight 87.8 kg, SpO2 93 %.  Lungs show prolonged expiratory phase mild scattered rhonchi mild end expiratory wheeze no rales audible heart regular rhythm no S3-S4 no heaves thrills rubs   Investigations:  Recent Results (from the past 240 hour(s))  Culture, Urine     Status: Abnormal   Collection Time: 08/27/18 11:59 PM  Result Value Ref Range Status   Specimen Description   Final    URINE, RANDOM Performed at [facility], [address], [place], [place] [address]    Special Requests   Final    NONE Performed at [facility], [address], [place], [place] [address]    Culture 60,000 COLONIES/mL ESCHERICHIA COLI (A)  Final   Report Status 08/30/2018 FINAL  Final   Organism ID, Bacteria ESCHERICHIA COLI (A)  Final      Susceptibility   Escherichia coli - MIC*    AMPICILLIN 16 INTERMEDIATE Intermediate     CEFAZOLIN <=4 SENSITIVE Sensitive     CEFTRIAXONE <=1 SENSITIVE Sensitive     CIPROFLOXACIN >=4 RESISTANT Resistant     GENTAMICIN 4 SENSITIVE Sensitive     IMIPENEM <=0.25 SENSITIVE Sensitive     NITROFURANTOIN <=16 SENSITIVE Sensitive     TRIMETH/SULFA <=20 SENSITIVE Sensitive     AMPICILLIN/SULBACTAM 4 SENSITIVE Sensitive     PIP/TAZO 8 SENSITIVE Sensitive     Extended ESBL  NEGATIVE Sensitive     * 60,000 COLONIES/mL ESCHERICHIA COLI     Basic Metabolic Panel: No results for input(s): NA, K, CL, CO2, GLUCOSE, BUN, CREATININE, CALCIUM, MG, PHOS in the last 72 hours. Liver Function Tests: No results for input(s): AST, ALT, ALKPHOS, BILITOT, PROT, ALBUMIN in the last 72 hours.   CBC: No results for input(s): WBC, NEUTROABS, HGB, HCT, MCV, PLT in the last 72 hours.  No results found.    Medications:   Impression: *Principal Problem:   Acute ischemic stroke (HCC) Active Problems:   Hypothyroidism   DM type 2 causing complication (HCC)   Essential hypertension   Chronic back pain   CAD (coronary artery disease)   Acute lower UTI     Plan: Continue Rocephin 2 g IV every 24 hours add Septra DS once a day starting today to observe any possible reaction for suppressive therapy due to chronicity and recurrence of UTI increase Levemir to 20 units twice daily monitor glycemic response.  Home health care and outpatient physical therapy for probable discharge Tuesday  Consultants: Neurology   Procedures   Antibiotics: Rocephin 2 g IV every 24 hours add Septra DS daily for suppressive therapy starting today         Time spent: 30 minutes   LOS: 0 days   [name] [name]   08/31/2018, 2:26 PM

## 2018-08-31 NOTE — Progress Notes (Signed)
Physical Therapy Treatment Patient Details Name: [name] [name] MRN: [id number] DOB: April 02, 1936 Today's Date: 08/31/2018    History of Present Illness [name] [name] is a 82 y.o. female with medical history significant for coronary artery disease status post remote CABG, history of CVA, COPD, hypertension, and insulin-dependent diabetes mellitus, now presenting to the emergency department for evaluation of speech and gait disturbance.  Patient reportedly went to bed in her usual state the night of 08/26/2018, but woke the following morning with balance difficulty, unable to walk without assistance, and was noted by family to have some confusion and slurred speech.  Patient has been experiencing a right-sided headache.  She fell several days ago and was evaluated in the emergency department at that time with negative head CT.  She reports a history of stroke, but denies any residual deficits.  Her deficits have seemed to improve and confusion seems to have resolved, but she continues to have difficulty ambulating and subtle dysarthria that was not present when she went to bed on 08/26/2018 per report of family.    PT Comments    Pt was seen for balance and gait on RW, and ck of O2 sats shows fluctuations on room air.  Her lowest was 92% but is able to recover to 97% on room air after walk. Discussed her itching and discomfort of legs from psoriasis, and joint changes of RA but these are not impeding mobility.  Follow acutely and ck O2 with movement as this is impacting her safety and will need following at SNF.   Follow Up Recommendations  SNF     Equipment Recommendations  None recommended by PT    Recommendations for Other Services       Precautions / Restrictions Precautions Precautions: Fall Precaution Comments: ck O2 sats Restrictions Weight Bearing Restrictions: No    Mobility  Bed Mobility Overal bed mobility: Needs Assistance Bed Mobility: Supine to Sit;Sit to Supine      Supine to sit: Supervision;Min guard Sit to supine: Min guard      Transfers Overall transfer level: Needs assistance Equipment used: Rolling walker (2 wheeled) Transfers: Sit to/from Stand Sit to Stand: Min guard Stand pivot transfers: Min guard          Ambulation/Gait Ambulation/Gait assistance: Min Chemical engineer (Feet): 100 Feet Assistive device: Rolling walker (2 wheeled) Gait Pattern/deviations: Step-through pattern;Decreased stride length;Wide base of support;Trunk flexed Gait velocity: reduced Gait velocity interpretation: <1.31 ft/sec, indicative of household ambulator General Gait Details: more steady but did rquire standing rests over SOB   Stairs             Wheelchair Mobility    Modified Rankin (Stroke Patients Only)       Balance   Sitting-balance support: Feet supported Sitting balance-Leahy Scale: Good       Standing balance-Leahy Scale: Fair Standing balance comment: fair using RW                            Cognition Arousal/Alertness: Awake/alert Behavior During Therapy: WFL for tasks assessed/performed Overall Cognitive Status: Within Functional Limits for tasks assessed                                        Exercises      General Comments        Pertinent Vitals/Pain Pain Assessment: No/denies  pain    Home Living                      Prior Function            PT Goals (current goals can now be found in the care plan section) Acute Rehab PT Goals Patient Stated Goal: return home Progress towards PT goals: Progressing toward goals    Frequency    7X/week      PT Plan Current plan remains appropriate    Co-evaluation              AM-PAC PT "6 Clicks" Daily Activity  Outcome Measure  Difficulty turning over in bed (including adjusting bedclothes, sheets and blankets)?: A Little Difficulty moving from lying on back to sitting on the side of the bed? :  Unable Difficulty sitting down on and standing up from a chair with arms (e.g., wheelchair, bedside commode, etc,.)?: Unable Help needed moving to and from a bed to chair (including a wheelchair)?: A Little Help needed walking in hospital room?: A Little Help needed climbing 3-5 steps with a railing? : A Lot 6 Click Score: 13    End of Session Equipment Utilized During Treatment: Gait belt Activity Tolerance: Patient tolerated treatment well;Other (comment)(O2 sats were 93% or better) Patient left: in bed;with call bell/phone within reach;with bed alarm set Nurse Communication: Mobility status PT Visit Diagnosis: Unsteadiness on feet (R26.81);Muscle weakness (generalized) (M62.81);Difficulty in walking, not elsewhere classified (R26.2)     Time: 1535-1600 PT Time Calculation (min) (ACUTE ONLY): 25 min  Charges:  $Gait Training: 8-22 mins $Therapeutic Activity: 8-22 mins                     Ivar Drape 08/31/2018, 6:09 PM   Samul Dada, PT MS Acute Rehab Dept. Number: [facility] [id number] and [facility] [phone]

## 2018-08-31 NOTE — Clinical Social Work Note (Signed)
Patient s now apprehensively agreeable to SNF.  She wants to remain in [place] for SNF.      Thursa Emme, Juleen China, LCSW

## 2018-09-01 LAB — GLUCOSE, CAPILLARY
GLUCOSE-CAPILLARY: 411 mg/dL — AB (ref 70–99)
Glucose-Capillary: 251 mg/dL — ABNORMAL HIGH (ref 70–99)

## 2018-09-01 MED ORDER — SULFAMETHOXAZOLE-TRIMETHOPRIM 800-160 MG PO TABS: 1.0000 | ORAL_TABLET | Freq: Every day | ORAL | 5 refills | Status: DC

## 2018-09-01 MED ORDER — PHENAZOPYRIDINE HCL 100 MG PO TABS: 100.0000 mg | ORAL_TABLET | Freq: Three times a day (TID) | ORAL | 0 refills | Status: DC

## 2018-09-01 NOTE — Discharge Summary (Signed)
Physician Discharge Summary  [name] [name] GYI:[id number] DOB: [birth year] DOA: 08/27/2018  PCP: Oval Linsey, MD  Admit date: 08/27/2018 Discharge date: 09/01/2018   Recommendations for Outpatient Follow-up:  Patient is instructed to take Septra DS 1 tablet p.o. daily for the next 3 months for suppressive therapy for due to chronic UTI which is sensitive to Septra and resistant to Cipro which she had been treated with as an outpatient.  She is also given Pyridium 100 mg p.o. 3 times daily for up to a week she is to resume her Novolin 70/30 40 units before meals twice daily breakfast and supper as well as all of her other prehospital admission medicines she is to follow-up with home health care and physical therapy in her home and follow-up in my office within 1 week's time Discharge Diagnoses:  Principal Problem:   Acute ischemic stroke [facility]) Active Problems:   Hypothyroidism   DM type 2 causing complication (HCC)   Essential hypertension   Chronic back pain   CAD (coronary artery disease)   Acute lower UTI   Discharge Condition: Good  Filed Weights   08/29/18 1500 09/01/18 0620  Weight: 87.8 kg 85.6 kg    History of present illness:  The patient is a 82 year old white female who is a vasculopath with insulin-dependent diabetes hypertension hyperlipidemia anxiety depression due to loss of all children the last 2 years likewise she has psoriasis and COPD with regular exacerbations she was admitted to the hospital with an altered mental status consideration for stroke was given initial CT revealed no acute central nervous system infarct or bleed subsequent MRA MRI revealed 90% blockage in the distal anterior vessels however the primary and secondary vessels were without any significant stenosis or high-grade flow she was seen in consultation by neurology she is placed on full dose aspirin and instructed to try and limit her risk factors for vascular disease such as controlling her  glucose lipids and hypertension she was given 4 times daily nebulizer therapy while in hospital and had no significant wheezing hemodynamically stable throughout her hospital stay was quite anxious she has a chronic UTI which is been treated with multiple outpatient antibiotics cultures obtained in hospital reveal it is resistant to Cipro which is an antibiotic use 2-3 times as an outpatient it is sensitive to Septra which is the only other oral medication listed on the sensitivity report she was treated with Rocephin in hospital which was sensitive to 2 g IV every 24 hours she had significant relief of dysuria she likewise was given Pyridium 100 mg p.o. 3 times daily for relief of dysuria she was queried on appropriateness for rehab stay in skilled nursing facility she was quite anxious about this and denied or refused SNF admission she promised she would work on outpatient physical therapy at home this remains to be seen she is not quite steady ambulating.  She will follow-up in my office in 1 week's time  Hospital Course:  See HPI above  Procedures:  CT scan of head and MRA MRI of head and neck  Consultations:  Neurology  Discharge Instructions  Discharge Instructions    Discharge instructions   Complete by:  As directed    Discharge patient   Complete by:  As directed    Discharge disposition:  01-Home or Self Care   Discharge patient date:  09/01/2018     Allergies as of 09/01/2018      Reactions   Codeine Itching  Medication List    STOP taking these medications   cephALEXin 500 MG capsule Commonly known as:  KEFLEX   ciprofloxacin 250 MG tablet Commonly known as:  CIPRO   HYDROcodone-acetaminophen 5-325 MG tablet Commonly known as:  NORCO/VICODIN   predniSONE 20 MG tablet Commonly known as:  DELTASONE     TAKE these medications   amLODipine 5 MG tablet Commonly known as:  NORVASC Take 5 mg by mouth daily.   aspirin EC 325 MG tablet Take 325 mg by mouth  daily.   gabapentin 300 MG capsule Commonly known as:  NEURONTIN Take 300 mg by mouth 2 (two) times daily.   insulin NPH-regular Human (70-30) 100 UNIT/ML injection Commonly known as:  NOVOLIN 70/30 Inject 40 Units into the skin 2 (two) times daily.   ipratropium-albuterol 0.5-2.5 (3) MG/3ML Soln Commonly known as:  DUONEB Inhale 3 mLs into the lungs 4 (four) times daily as needed.   levothyroxine 25 MCG tablet Commonly known as:  SYNTHROID, LEVOTHROID Take 25 mcg by mouth daily.   lisinopril 10 MG tablet Commonly known as:  PRINIVIL,ZESTRIL Take 10 mg by mouth daily.   metFORMIN 500 MG tablet Commonly known as:  GLUCOPHAGE Take 500 mg by mouth 2 (two) times daily with a meal.   MUCUS RELIEF DM 30-600 MG Tb12 Take 1 tablet by mouth every 12 (twelve) hours.   nitroGLYCERIN 0.4 MG SL tablet Commonly known as:  NITROSTAT Place 0.4 mg under the tongue every 5 (five) minutes as needed for chest pain.   oxyCODONE 5 MG immediate release tablet Commonly known as:  Oxy IR/ROXICODONE Take 1 tablet (5 mg total) by mouth every 4 (four) hours as needed (Moderate pain).   phenazopyridine 100 MG tablet Commonly known as:  PYRIDIUM Take 1 tablet (100 mg total) by mouth 3 (three) times daily with meals.   sulfamethoxazole-trimethoprim 800-160 MG tablet Commonly known as:  BACTRIM DS,SEPTRA DS Take 1 tablet by mouth daily. Start taking on:  09/02/2018   triamcinolone cream 0.1 % Commonly known as:  KENALOG APPLY TO AFFECTED AREA TWICE A DAY AS DIRECTED FOR FLARES      Allergies  Allergen Reactions  . Codeine Itching   Contact information for after-discharge care    Destination    [facility] Preferred SNF .   Service:  Skilled Nursing Contact information: [address] [place] [address] [phone]               The results of significant diagnostics from this hospitalization (including imaging, microbiology, ancillary and  laboratory) are listed below for reference.    Significant Diagnostic Studies: Ct Angio Head W Or Wo Contrast  Result Date: 08/27/2018 CLINICAL DATA:  Initial evaluation for acute confusion, left-sided facial droop. EXAM: CT ANGIOGRAPHY HEAD AND NECK TECHNIQUE: Multidetector CT imaging of the head and neck was performed using the standard protocol during bolus administration of intravenous contrast. Multiplanar CT image reconstructions and MIPs were obtained to evaluate the vascular anatomy. Carotid stenosis measurements (when applicable) are obtained utilizing NASCET criteria, using the distal internal carotid diameter as the denominator. CONTRAST:  ISOVUE-370 IOPAMIDOL (ISOVUE-370) INJECTION 76% COMPARISON:  Prior CT from 08/23/2018. FINDINGS: CT HEAD FINDINGS Brain: Moderate cerebral atrophy with chronic small vessel ischemic disease. No acute intracranial hemorrhage. No acute large vessel territory infarct. No mass lesion, midline shift or mass effect. No hydrocephalus. No extra-axial fluid collection. Vascular: No hyperdense vessel. Scattered vascular calcifications noted within the carotid siphons. Skull: Scalp  soft tissues and calvarium within normal limits. Sinuses: Partial opacification of the visualized right sphenoid sinus. Visualized paranasal sinuses and mastoid air cells are otherwise clear. Orbits: Globes and orbital soft tissues within normal limits. Review of the MIP images confirms the above findings CTA NECK FINDINGS Aortic arch: Visualized aortic arch of normal caliber with normal 3 vessel morphology. No hemodynamically significant stenosis about the origin of the great vessels. Visualized subclavian arteries widely patent. Right carotid system: Right common carotid artery tortuous proximally but widely patent to the bifurcation. Scattered mixed plaque about the right bifurcation without hemodynamically significant stenosis. Right ICA tortuous but widely patent to the skull base without  stenosis, dissection, or occlusion. Left carotid system: Left common carotid artery tortuous proximally but widely patent to the bifurcation without stenosis. Mixed atheromatous plaque about the left bifurcation without hemodynamically significant stenosis. Left ICA tortuous but widely patent distally to the skull base without stenosis, dissection or occlusion. Vertebral arteries: Both of the vertebral arteries arise from the subclavian arteries. Vertebral arteries widely patent within the neck without stenosis, dissection, or occlusion. Skeleton: No acute osseus abnormality. No worrisome lytic or blastic osseous lesions. Moderate cervical spondylolysis at C5-6 and C6-7. Other neck: Visualized soft tissues of the neck demonstrate no acute finding. Salivary glands normal. Thyroid normal. No adenopathy. Upper chest: Visualized upper chest within normal limits. Sequelae of prior CABG partially visualized. Postsurgical changes and/or scarring noted at the lung apices bilaterally. Review of the MIP images confirms the above findings CTA HEAD FINDINGS Anterior circulation: Petrous segments widely patent bilaterally. Moderate to advanced atheromatous change throughout the carotid siphons bilaterally. Associated moderate approximate 50% stenosis at the supraclinoid left ICA, with moderate to severe approximate 75% stenosis at the supraclinoid right ICA. ICA termini patent bilaterally. Left A1 dominant and widely patent. Right A1 diminutive with severe proximal right A1 stenosis (series 11, image 78). Normal anterior communicating artery. Short-segment severe distal left A2 stenosis (series 11, image 61). Anterior cerebral arteries otherwise patent to its distal aspects. M1 segments patent without high-grade stenosis or occlusion. Normal MCA bifurcations. Short-segment severe proximal left M2 stenosis (series 10, image 100). No proximal M2 occlusion. Distal MCA branches demonstrate multifocal atheromatous irregularity but  are well perfused and fairly symmetric. Posterior circulation: Vertebral arteries patent to the vertebrobasilar junction without stenosis. Posterior inferior cerebral arteries patent bilaterally. Basilar patent to its distal aspect without stenosis. Superior cerebral arteries patent bilaterally. Left PCA supplied via the basilar. Fetal type origin of the right PCA. Multifocal moderate to severe bilateral P2 stenoses, right worse than left. PCAs are patent to their distal aspects. Venous sinuses: Patent. Anatomic variants: Fetal type origin of the right PCA. Hypoplastic right P1 segment. Delayed phase: No abnormal enhancement. Review of the MIP images confirms the above findings IMPRESSION: CT HEAD IMPRESSION 1. No acute intracranial abnormality. 2. Moderate cerebral atrophy with chronic small vessel ischemic disease. CTA HEAD AND NECK IMPRESSION 1. Negative CTA for emergent large vessel occlusion. 2. Moderate to advanced atherosclerotic change throughout the intracranial circulation with multifocal moderate to severe stenoses as detailed above. Changes most notable at the supraclinoid ICAs, right A1 segment, and bilateral P2 segments. 3. Mild atheromatous change about the carotid bifurcations without significant stenosis. No critical flow limiting stenosis within the neck. Electronically Signed   By: Rise Mu M.D.   On: 08/27/2018 23:19   Ct Head Wo Contrast  Result Date: 08/23/2018 CLINICAL DATA:  Seven 71-year-old female with head trauma. EXAM: CT HEAD WITHOUT CONTRAST TECHNIQUE: Contiguous  axial images were obtained from the base of the skull through the vertex without intravenous contrast. COMPARISON:  Head CT dated 06/10/2018 FINDINGS: Brain: There is mild age-related atrophy and chronic microvascular ischemic changes. No acute intracranial hemorrhage. Mass effect or midline shift. No extra-axial fluid collection. Vascular: No hyperdense vessel or unexpected calcification. Skull: Normal. Negative  for fracture or focal lesion. Sinuses/Orbits: The visualized paranasal sinuses and the right mastoid air cells are clear. Mild left mastoid effusions. Other: Mild right temporal scalp contusion. IMPRESSION: 1. No acute intracranial hemorrhage. 2. Age-related atrophy and chronic microvascular ischemic changes. Electronically Signed   By: Elgie Collard M.D.   On: 08/23/2018 05:01   Ct Angio Neck W Or Wo Contrast  Result Date: 08/27/2018 CLINICAL DATA:  Initial evaluation for acute confusion, left-sided facial droop. EXAM: CT ANGIOGRAPHY HEAD AND NECK TECHNIQUE: Multidetector CT imaging of the head and neck was performed using the standard protocol during bolus administration of intravenous contrast. Multiplanar CT image reconstructions and MIPs were obtained to evaluate the vascular anatomy. Carotid stenosis measurements (when applicable) are obtained utilizing NASCET criteria, using the distal internal carotid diameter as the denominator. CONTRAST:  ISOVUE-370 IOPAMIDOL (ISOVUE-370) INJECTION 76% COMPARISON:  Prior CT from 08/23/2018. FINDINGS: CT HEAD FINDINGS Brain: Moderate cerebral atrophy with chronic small vessel ischemic disease. No acute intracranial hemorrhage. No acute large vessel territory infarct. No mass lesion, midline shift or mass effect. No hydrocephalus. No extra-axial fluid collection. Vascular: No hyperdense vessel. Scattered vascular calcifications noted within the carotid siphons. Skull: Scalp soft tissues and calvarium within normal limits. Sinuses: Partial opacification of the visualized right sphenoid sinus. Visualized paranasal sinuses and mastoid air cells are otherwise clear. Orbits: Globes and orbital soft tissues within normal limits. Review of the MIP images confirms the above findings CTA NECK FINDINGS Aortic arch: Visualized aortic arch of normal caliber with normal 3 vessel morphology. No hemodynamically significant stenosis about the origin of the great vessels.  Visualized subclavian arteries widely patent. Right carotid system: Right common carotid artery tortuous proximally but widely patent to the bifurcation. Scattered mixed plaque about the right bifurcation without hemodynamically significant stenosis. Right ICA tortuous but widely patent to the skull base without stenosis, dissection, or occlusion. Left carotid system: Left common carotid artery tortuous proximally but widely patent to the bifurcation without stenosis. Mixed atheromatous plaque about the left bifurcation without hemodynamically significant stenosis. Left ICA tortuous but widely patent distally to the skull base without stenosis, dissection or occlusion. Vertebral arteries: Both of the vertebral arteries arise from the subclavian arteries. Vertebral arteries widely patent within the neck without stenosis, dissection, or occlusion. Skeleton: No acute osseus abnormality. No worrisome lytic or blastic osseous lesions. Moderate cervical spondylolysis at C5-6 and C6-7. Other neck: Visualized soft tissues of the neck demonstrate no acute finding. Salivary glands normal. Thyroid normal. No adenopathy. Upper chest: Visualized upper chest within normal limits. Sequelae of prior CABG partially visualized. Postsurgical changes and/or scarring noted at the lung apices bilaterally. Review of the MIP images confirms the above findings CTA HEAD FINDINGS Anterior circulation: Petrous segments widely patent bilaterally. Moderate to advanced atheromatous change throughout the carotid siphons bilaterally. Associated moderate approximate 50% stenosis at the supraclinoid left ICA, with moderate to severe approximate 75% stenosis at the supraclinoid right ICA. ICA termini patent bilaterally. Left A1 dominant and widely patent. Right A1 diminutive with severe proximal right A1 stenosis (series 11, image 78). Normal anterior communicating artery. Short-segment severe distal left A2 stenosis (series 11, image 61). Anterior  cerebral arteries otherwise patent to its distal aspects. M1 segments patent without high-grade stenosis or occlusion. Normal MCA bifurcations. Short-segment severe proximal left M2 stenosis (series 10, image 100). No proximal M2 occlusion. Distal MCA branches demonstrate multifocal atheromatous irregularity but are well perfused and fairly symmetric. Posterior circulation: Vertebral arteries patent to the vertebrobasilar junction without stenosis. Posterior inferior cerebral arteries patent bilaterally. Basilar patent to its distal aspect without stenosis. Superior cerebral arteries patent bilaterally. Left PCA supplied via the basilar. Fetal type origin of the right PCA. Multifocal moderate to severe bilateral P2 stenoses, right worse than left. PCAs are patent to their distal aspects. Venous sinuses: Patent. Anatomic variants: Fetal type origin of the right PCA. Hypoplastic right P1 segment. Delayed phase: No abnormal enhancement. Review of the MIP images confirms the above findings IMPRESSION: CT HEAD IMPRESSION 1. No acute intracranial abnormality. 2. Moderate cerebral atrophy with chronic small vessel ischemic disease. CTA HEAD AND NECK IMPRESSION 1. Negative CTA for emergent large vessel occlusion. 2. Moderate to advanced atherosclerotic change throughout the intracranial circulation with multifocal moderate to severe stenoses as detailed above. Changes most notable at the supraclinoid ICAs, right A1 segment, and bilateral P2 segments. 3. Mild atheromatous change about the carotid bifurcations without significant stenosis. No critical flow limiting stenosis within the neck. Electronically Signed   By: Rise Mu M.D.   On: 08/27/2018 23:19   [name] Contrast  Result Date: 08/28/2018 CLINICAL DATA:  Confusion.  Left facial droop.  Recent fall. EXAM: MRI HEAD WITHOUT CONTRAST TECHNIQUE: Multiplanar, multiecho pulse sequences of the brain and surrounding structures were obtained without  intravenous contrast. COMPARISON:  CT head 08/27/2018 FINDINGS: Brain: Negative for acute infarct. Moderate atrophy and moderate chronic microvascular ischemic change in the white matter and pons. Negative for hemorrhage or mass Image quality degraded by motion Vascular: Normal arterial flow voids Skull and upper cervical spine: Negative Sinuses/Orbits: Negative Other: None IMPRESSION: No acute abnormality. Moderate atrophy and moderate chronic microvascular ischemic change in the white matter and pons. Electronically Signed   By: Marlan Palau M.D.   On: 08/28/2018 08:06    Microbiology: Recent Results (from the past 240 hour(s))  Culture, Urine     Status: Abnormal   Collection Time: 08/27/18 11:59 PM  Result Value Ref Range Status   Specimen Description   Final    URINE, RANDOM Performed at [facility], [address] [place], [place] [address]    Special Requests   Final    NONE Performed at [facility], [address], [place], [place] [address]    Culture 60,000 COLONIES/mL ESCHERICHIA COLI (A)  Final   Report Status 08/30/2018 FINAL  Final   Organism ID, Bacteria ESCHERICHIA COLI (A)  Final      Susceptibility   Escherichia coli - MIC*    AMPICILLIN 16 INTERMEDIATE Intermediate     CEFAZOLIN <=4 SENSITIVE Sensitive     CEFTRIAXONE <=1 SENSITIVE Sensitive     CIPROFLOXACIN >=4 RESISTANT Resistant     GENTAMICIN 4 SENSITIVE Sensitive     IMIPENEM <=0.25 SENSITIVE Sensitive     NITROFURANTOIN <=16 SENSITIVE Sensitive     TRIMETH/SULFA <=20 SENSITIVE Sensitive     AMPICILLIN/SULBACTAM 4 SENSITIVE Sensitive     PIP/TAZO 8 SENSITIVE Sensitive     Extended ESBL NEGATIVE Sensitive     * 60,000 COLONIES/mL ESCHERICHIA COLI     Labs: Basic Metabolic Panel: Recent Labs  Lab 08/27/18 2132 08/27/18 2138  NA 140 140  K 3.8  3.7  CL 105 103  CO2 30  --   GLUCOSE 144* 138*  BUN 16 15  CREATININE 0.73 0.80  CALCIUM 8.9  --    Liver Function Tests: Recent Labs  Lab  08/27/18 2132  AST 10*  ALT 9  ALKPHOS 81  BILITOT 0.4  PROT 6.5  ALBUMIN 3.4*   No results for input(s): LIPASE, AMYLASE in the last 168 hours. No results for input(s): AMMONIA in the last 168 hours. CBC: Recent Labs  Lab 08/27/18 2133 08/27/18 2138  WBC 13.6*  --   NEUTROABS 8.5*  --   HGB 12.8 13.3  HCT 41.2 39.0  MCV 82.2  --   PLT 281  --    Cardiac Enzymes: No results for input(s): CKTOTAL, CKMB, CKMBINDEX, TROPONINI in the last 168 hours. BNP: BNP (last 3 results) Recent Labs    02/28/18 1708  BNP 72.0    ProBNP (last 3 results) No results for input(s): PROBNP in the last 8760 hours.  CBG: Recent Labs  Lab 08/31/18 1127 08/31/18 1612 08/31/18 2211 09/01/18 0730 09/01/18 1131  GLUCAP 346* 310* 255* 251* 411*       Signed:  Chakara Bognar M   Pager: [phone] 09/01/2018, 12:24 PM

## 2018-09-01 NOTE — Progress Notes (Addendum)
CRITICAL VALUE ALERT  Critical Value: CBG 411  Date & Time Notied:  09/01/2018 @ 1145  Provider Notified: yes, [name]  Orders Received/Actions taken: awaiting further instructions Dr ordered 9 units of novolog to be given which I administered

## 2018-09-01 NOTE — Care Management Note (Signed)
Case Management Note  Patient Details  Name: [name] [name] MRN: [id number] Date of Birth: 12/11/1936  Subjective/Objective:      Has decided to go home aftering being offered bed at [facility]. Per visit Friday. Pt requests [facility] from list of providers. Aware HH has 48 hrs to make first visit.               Action/Plan: DC home today with referral for [facility] nursing and PT through [facility]. Bonita Quin, [facility] rep, given referral.   Expected Discharge Date:  09/01/18               Expected Discharge Plan:  Home w Home Health Services  In-House Referral:  Clinical Social Work  Discharge planning Services  CM Consult  Post Acute Care Choice:  Home Health Choice offered to:  Patient  DME Arranged:    DME Agency:     HH Arranged:  RN, PT HH Agency:  [facility]  Status of Service:  In process, will continue to follow  If discussed at Long Length of Stay Meetings, dates discussed:    Additional Comments:  Malcolm Metro, RN 09/01/2018, 1:48 PM

## 2018-09-01 NOTE — Progress Notes (Signed)
Removed IV-clean, dry, intact. Reviewed new medications and medication changes. Reviewed d/c paperwork with patient. Wheeled stable patient and belongings to main entrance where she was picked up by her grandson.

## 2018-09-13 ENCOUNTER — Inpatient Hospital Stay (HOSPITAL_COMMUNITY)
Admission: EM | Admit: 2018-09-13 | Discharge: 2018-09-20 | DRG: 191 | Disposition: A | Discharge status: Home Health | Payer: Medicare HMO | Attending: Family Medicine | Admitting: Family Medicine

## 2018-09-13 ENCOUNTER — Encounter (HOSPITAL_COMMUNITY): Payer: Self-pay | Admitting: *Deleted

## 2018-09-13 ENCOUNTER — Emergency Department (HOSPITAL_COMMUNITY): Payer: Medicare HMO

## 2018-09-13 ENCOUNTER — Other Ambulatory Visit: Payer: Self-pay

## 2018-09-13 DIAGNOSIS — Z9842 Cataract extraction status, left eye: Secondary | ICD-10-CM

## 2018-09-13 DIAGNOSIS — Z800 Family history of malignant neoplasm of digestive organs: Secondary | ICD-10-CM

## 2018-09-13 DIAGNOSIS — Z90722 Acquired absence of ovaries, bilateral: Secondary | ICD-10-CM

## 2018-09-13 DIAGNOSIS — I2510 Atherosclerotic heart disease of native coronary artery without angina pectoris: Secondary | ICD-10-CM | POA: Diagnosis present

## 2018-09-13 DIAGNOSIS — Z79890 Hormone replacement therapy (postmenopausal): Secondary | ICD-10-CM

## 2018-09-13 DIAGNOSIS — Z794 Long term (current) use of insulin: Secondary | ICD-10-CM

## 2018-09-13 DIAGNOSIS — Z885 Allergy status to narcotic agent status: Secondary | ICD-10-CM

## 2018-09-13 DIAGNOSIS — G4733 Obstructive sleep apnea (adult) (pediatric): Secondary | ICD-10-CM | POA: Diagnosis present

## 2018-09-13 DIAGNOSIS — Z79899 Other long term (current) drug therapy: Secondary | ICD-10-CM

## 2018-09-13 DIAGNOSIS — J441 Chronic obstructive pulmonary disease with (acute) exacerbation: Principal | ICD-10-CM

## 2018-09-13 DIAGNOSIS — Z8249 Family history of ischemic heart disease and other diseases of the circulatory system: Secondary | ICD-10-CM

## 2018-09-13 DIAGNOSIS — E039 Hypothyroidism, unspecified: Secondary | ICD-10-CM | POA: Diagnosis present

## 2018-09-13 DIAGNOSIS — I110 Hypertensive heart disease with heart failure: Secondary | ICD-10-CM | POA: Diagnosis present

## 2018-09-13 DIAGNOSIS — N390 Urinary tract infection, site not specified: Secondary | ICD-10-CM | POA: Diagnosis present

## 2018-09-13 DIAGNOSIS — E1165 Type 2 diabetes mellitus with hyperglycemia: Secondary | ICD-10-CM | POA: Diagnosis present

## 2018-09-13 DIAGNOSIS — Z7952 Long term (current) use of systemic steroids: Secondary | ICD-10-CM

## 2018-09-13 DIAGNOSIS — Y92230 Patient room in hospital as the place of occurrence of the external cause: Secondary | ICD-10-CM | POA: Diagnosis not present

## 2018-09-13 DIAGNOSIS — I252 Old myocardial infarction: Secondary | ICD-10-CM

## 2018-09-13 DIAGNOSIS — Z9114 Patient's other noncompliance with medication regimen: Secondary | ICD-10-CM

## 2018-09-13 DIAGNOSIS — E875 Hyperkalemia: Secondary | ICD-10-CM | POA: Diagnosis not present

## 2018-09-13 DIAGNOSIS — Z90710 Acquired absence of both cervix and uterus: Secondary | ICD-10-CM

## 2018-09-13 DIAGNOSIS — M1990 Unspecified osteoarthritis, unspecified site: Secondary | ICD-10-CM | POA: Diagnosis present

## 2018-09-13 DIAGNOSIS — G4730 Sleep apnea, unspecified: Secondary | ICD-10-CM | POA: Diagnosis present

## 2018-09-13 DIAGNOSIS — Z951 Presence of aortocoronary bypass graft: Secondary | ICD-10-CM

## 2018-09-13 DIAGNOSIS — Z79891 Long term (current) use of opiate analgesic: Secondary | ICD-10-CM

## 2018-09-13 DIAGNOSIS — Z87440 Personal history of urinary (tract) infections: Secondary | ICD-10-CM

## 2018-09-13 DIAGNOSIS — E118 Type 2 diabetes mellitus with unspecified complications: Secondary | ICD-10-CM | POA: Diagnosis present

## 2018-09-13 DIAGNOSIS — E1142 Type 2 diabetes mellitus with diabetic polyneuropathy: Secondary | ICD-10-CM | POA: Diagnosis present

## 2018-09-13 DIAGNOSIS — I8390 Asymptomatic varicose veins of unspecified lower extremity: Secondary | ICD-10-CM | POA: Diagnosis present

## 2018-09-13 DIAGNOSIS — Z961 Presence of intraocular lens: Secondary | ICD-10-CM | POA: Diagnosis present

## 2018-09-13 DIAGNOSIS — T368X5A Adverse effect of other systemic antibiotics, initial encounter: Secondary | ICD-10-CM | POA: Diagnosis not present

## 2018-09-13 DIAGNOSIS — J45909 Unspecified asthma, uncomplicated: Secondary | ICD-10-CM

## 2018-09-13 DIAGNOSIS — I5032 Chronic diastolic (congestive) heart failure: Secondary | ICD-10-CM | POA: Diagnosis present

## 2018-09-13 DIAGNOSIS — Z823 Family history of stroke: Secondary | ICD-10-CM

## 2018-09-13 DIAGNOSIS — Z9841 Cataract extraction status, right eye: Secondary | ICD-10-CM

## 2018-09-13 DIAGNOSIS — Z9119 Patient's noncompliance with other medical treatment and regimen: Secondary | ICD-10-CM

## 2018-09-13 DIAGNOSIS — Z91199 Patient's noncompliance with other medical treatment and regimen due to unspecified reason: Secondary | ICD-10-CM

## 2018-09-13 DIAGNOSIS — E785 Hyperlipidemia, unspecified: Secondary | ICD-10-CM | POA: Diagnosis present

## 2018-09-13 DIAGNOSIS — Z9049 Acquired absence of other specified parts of digestive tract: Secondary | ICD-10-CM

## 2018-09-13 DIAGNOSIS — Z7982 Long term (current) use of aspirin: Secondary | ICD-10-CM

## 2018-09-13 DIAGNOSIS — Z8673 Personal history of transient ischemic attack (TIA), and cerebral infarction without residual deficits: Secondary | ICD-10-CM

## 2018-09-13 DIAGNOSIS — I10 Essential (primary) hypertension: Secondary | ICD-10-CM | POA: Diagnosis present

## 2018-09-13 LAB — BASIC METABOLIC PANEL
ANION GAP: 6 (ref 5–15)
BUN: 10 mg/dL (ref 8–23)
CO2: 31 mmol/L (ref 22–32)
Calcium: 10 mg/dL (ref 8.9–10.3)
Chloride: 104 mmol/L (ref 98–111)
Creatinine, Ser: 0.75 mg/dL (ref 0.44–1.00)
GFR calc non Af Amer: >60 mL/min (ref >60)
Glucose, Bld: 75 mg/dL (ref 70–99)
Potassium: 3.8 mmol/L (ref 3.5–5.1)
SODIUM: 141 mmol/L (ref 135–145)

## 2018-09-13 LAB — CBC WITH DIFFERENTIAL/PLATELET
Basophils Absolute: 0.1 10*3/uL (ref 0.0–0.1)
Basophils Relative: 0 %
Eosinophils Absolute: 1 10*3/uL — ABNORMAL HIGH (ref 0.0–0.7)
Eosinophils Relative: 8 %
HEMATOCRIT: 45.6 % (ref 36.0–46.0)
HEMOGLOBIN: 14.2 g/dL (ref 12.0–15.0)
Lymphocytes Relative: 35 %
Lymphs Abs: 4.5 10*3/uL — ABNORMAL HIGH (ref 0.7–4.0)
MCH: 25.8 pg — ABNORMAL LOW (ref 26.0–34.0)
MCHC: 31.1 g/dL (ref 30.0–36.0)
MCV: 82.8 fL (ref 78.0–100.0)
Monocytes Absolute: 1.1 10*3/uL — ABNORMAL HIGH (ref 0.1–1.0)
Monocytes Relative: 9 %
NEUTROS ABS: 6 10*3/uL (ref 1.7–7.7)
NEUTROS PCT: 48 %
PLATELETS: 318 10*3/uL (ref 150–400)
RBC: 5.51 MIL/uL — AB (ref 3.87–5.11)
RDW: 15.7 % — AB (ref 11.5–15.5)
WBC: 12.7 10*3/uL — AB (ref 4.0–10.5)

## 2018-09-13 LAB — GLUCOSE, CAPILLARY
GLUCOSE-CAPILLARY: 337 mg/dL — AB (ref 70–99)
Glucose-Capillary: 217 mg/dL — ABNORMAL HIGH (ref 70–99)
Glucose-Capillary: 445 mg/dL — ABNORMAL HIGH (ref 70–99)
Glucose-Capillary: 344 mg/dL — ABNORMAL HIGH (ref 70–99)
Glucose-Capillary: 324 mg/dL — ABNORMAL HIGH (ref 70–99)

## 2018-09-13 LAB — I-STAT TROPONIN, ED: Troponin i, poc: 0 ng/mL (ref 0.00–0.08)

## 2018-09-13 LAB — BRAIN NATRIURETIC PEPTIDE: B Natriuretic Peptide: 63 pg/mL (ref 0.0–100.0)

## 2018-09-13 LAB — GLUCOSE, RANDOM: Glucose, Bld: 438 mg/dL — ABNORMAL HIGH (ref 70–99)

## 2018-09-13 MED ORDER — IPRATROPIUM-ALBUTEROL 0.5-2.5 (3) MG/3ML IN SOLN
2.5000 mg | Freq: Four times a day (QID) | RESPIRATORY_TRACT | Status: DC
  Administered 2018-09-14 – 2018-09-16 (×9): 2.5 mg via RESPIRATORY_TRACT
  Filled 2018-09-13 (×10): qty 3

## 2018-09-13 MED ORDER — ENOXAPARIN SODIUM 40 MG/0.4ML ~~LOC~~ SOLN
40.0000 mg | SUBCUTANEOUS | Status: DC
  Administered 2018-09-13 – 2018-09-20 (×8): 40 mg via SUBCUTANEOUS
  Filled 2018-09-13 (×8): qty 0.4

## 2018-09-13 MED ORDER — HYDROCODONE-ACETAMINOPHEN 5-325 MG PO TABS
ORAL_TABLET | ORAL | Status: AC
  Filled 2018-09-13: qty 1

## 2018-09-13 MED ORDER — HYDROCODONE-ACETAMINOPHEN 5-325 MG PO TABS
1.0000 | ORAL_TABLET | Freq: Once | ORAL | Status: AC
  Administered 2018-09-13: 1 via ORAL

## 2018-09-13 MED ORDER — ASPIRIN EC 325 MG PO TBEC
325.0000 mg | DELAYED_RELEASE_TABLET | Freq: Every day | ORAL | Status: DC
  Administered 2018-09-13 – 2018-09-20 (×8): 325 mg via ORAL
  Filled 2018-09-13 (×8): qty 1

## 2018-09-13 MED ORDER — METHYLPREDNISOLONE SODIUM SUCC 125 MG IJ SOLR
60.0000 mg | Freq: Four times a day (QID) | INTRAMUSCULAR | Status: DC
  Filled 2018-09-13: qty 2

## 2018-09-13 MED ORDER — GABAPENTIN 300 MG PO CAPS
300.0000 mg | ORAL_CAPSULE | Freq: Two times a day (BID) | ORAL | Status: DC
  Administered 2018-09-13 – 2018-09-20 (×15): 300 mg via ORAL
  Filled 2018-09-13 (×15): qty 1

## 2018-09-13 MED ORDER — ORAL CARE MOUTH RINSE
15.0000 mL | Freq: Two times a day (BID) | OROMUCOSAL | Status: DC
  Administered 2018-09-13 – 2018-09-20 (×6): 15 mL via OROMUCOSAL

## 2018-09-13 MED ORDER — METHYLPREDNISOLONE SODIUM SUCC 40 MG IJ SOLR
40.0000 mg | Freq: Three times a day (TID) | INTRAMUSCULAR | Status: DC
  Administered 2018-09-13 – 2018-09-15 (×6): 40 mg via INTRAVENOUS
  Filled 2018-09-13 (×6): qty 1

## 2018-09-13 MED ORDER — INSULIN ASPART 100 UNIT/ML ~~LOC~~ SOLN
0.0000 [IU] | Freq: Every day | SUBCUTANEOUS | Status: DC
  Administered 2018-09-13: 4 [IU] via SUBCUTANEOUS
  Administered 2018-09-14: 3 [IU] via SUBCUTANEOUS
  Administered 2018-09-15: 2 [IU] via SUBCUTANEOUS
  Administered 2018-09-16: 3 [IU] via SUBCUTANEOUS
  Administered 2018-09-17: 5 [IU] via SUBCUTANEOUS
  Administered 2018-09-18: 2 [IU] via SUBCUTANEOUS

## 2018-09-13 MED ORDER — LISINOPRIL 10 MG PO TABS
10.0000 mg | ORAL_TABLET | Freq: Every day | ORAL | Status: DC
  Administered 2018-09-13 – 2018-09-20 (×8): 10 mg via ORAL
  Filled 2018-09-13 (×8): qty 1

## 2018-09-13 MED ORDER — DOXYCYCLINE HYCLATE 100 MG PO TABS
100.0000 mg | ORAL_TABLET | Freq: Two times a day (BID) | ORAL | Status: DC
  Administered 2018-09-13 – 2018-09-15 (×5): 100 mg via ORAL
  Filled 2018-09-13 (×5): qty 1

## 2018-09-13 MED ORDER — INSULIN ASPART 100 UNIT/ML ~~LOC~~ SOLN
0.0000 [IU] | Freq: Three times a day (TID) | SUBCUTANEOUS | Status: DC
  Administered 2018-09-13: 15 [IU] via SUBCUTANEOUS
  Administered 2018-09-13 – 2018-09-14 (×2): 11 [IU] via SUBCUTANEOUS

## 2018-09-13 MED ORDER — METHYLPREDNISOLONE SODIUM SUCC 125 MG IJ SOLR
125.0000 mg | Freq: Once | INTRAMUSCULAR | Status: AC
  Administered 2018-09-13: 125 mg via INTRAVENOUS
  Filled 2018-09-13: qty 2

## 2018-09-13 MED ORDER — HYDRALAZINE HCL 20 MG/ML IJ SOLN: 10.0000 mg | Freq: Four times a day (QID) | INTRAMUSCULAR | Status: DC | PRN

## 2018-09-13 MED ORDER — SODIUM CHLORIDE 0.9% FLUSH
3.0000 mL | Freq: Two times a day (BID) | INTRAVENOUS | Status: DC
  Administered 2018-09-13 – 2018-09-20 (×13): 3 mL via INTRAVENOUS

## 2018-09-13 MED ORDER — INSULIN ASPART PROT & ASPART (70-30 MIX) 100 UNIT/ML ~~LOC~~ SUSP
40.0000 [IU] | Freq: Two times a day (BID) | SUBCUTANEOUS | Status: DC
  Administered 2018-09-13 – 2018-09-20 (×14): 40 [IU] via SUBCUTANEOUS
  Filled 2018-09-13 (×3): qty 10

## 2018-09-13 MED ORDER — IPRATROPIUM BROMIDE 0.02 % IN SOLN: 0.5000 mg | Freq: Four times a day (QID) | RESPIRATORY_TRACT | Status: DC

## 2018-09-13 MED ORDER — IPRATROPIUM-ALBUTEROL 0.5-2.5 (3) MG/3ML IN SOLN
2.5000 mg | Freq: Four times a day (QID) | RESPIRATORY_TRACT | Status: DC
  Administered 2018-09-13 (×3): 2.5 mg via RESPIRATORY_TRACT
  Filled 2018-09-13 (×3): qty 3

## 2018-09-13 MED ORDER — IPRATROPIUM-ALBUTEROL 0.5-2.5 (3) MG/3ML IN SOLN
3.0000 mL | Freq: Once | RESPIRATORY_TRACT | Status: AC
  Administered 2018-09-13: 3 mL via RESPIRATORY_TRACT
  Filled 2018-09-13: qty 3

## 2018-09-13 MED ORDER — OXYCODONE HCL 5 MG PO TABS
5.0000 mg | ORAL_TABLET | ORAL | Status: DC | PRN
  Administered 2018-09-13 – 2018-09-20 (×21): 5 mg via ORAL
  Filled 2018-09-13 (×21): qty 1

## 2018-09-13 MED ORDER — ONDANSETRON HCL 4 MG/2ML IJ SOLN: 4.0000 mg | Freq: Four times a day (QID) | INTRAMUSCULAR | Status: DC | PRN

## 2018-09-13 MED ORDER — ONDANSETRON HCL 4 MG PO TABS: 4.0000 mg | ORAL_TABLET | Freq: Four times a day (QID) | ORAL | Status: DC | PRN

## 2018-09-13 MED ORDER — LEVOTHYROXINE SODIUM 25 MCG PO TABS
25.0000 ug | ORAL_TABLET | Freq: Every day | ORAL | Status: DC
  Administered 2018-09-13 – 2018-09-20 (×8): 25 ug via ORAL
  Filled 2018-09-13 (×8): qty 1

## 2018-09-13 MED ORDER — INSULIN ASPART 100 UNIT/ML ~~LOC~~ SOLN
0.0000 [IU] | Freq: Three times a day (TID) | SUBCUTANEOUS | Status: DC
  Administered 2018-09-13: 3 [IU] via SUBCUTANEOUS

## 2018-09-13 MED ORDER — TRAZODONE HCL 50 MG PO TABS
50.0000 mg | ORAL_TABLET | Freq: Every evening | ORAL | Status: DC | PRN
  Administered 2018-09-13: 50 mg via ORAL
  Filled 2018-09-13: qty 1

## 2018-09-13 MED ORDER — SODIUM CHLORIDE 0.9 % IV SOLN: 250.0000 mL | INTRAVENOUS | Status: DC | PRN

## 2018-09-13 MED ORDER — GUAIFENESIN ER 600 MG PO TB12
600.0000 mg | ORAL_TABLET | Freq: Two times a day (BID) | ORAL | Status: DC
  Administered 2018-09-13 – 2018-09-16 (×7): 600 mg via ORAL
  Filled 2018-09-13 (×7): qty 1

## 2018-09-13 MED ORDER — SODIUM CHLORIDE 0.9% FLUSH
3.0000 mL | INTRAVENOUS | Status: DC | PRN
  Administered 2018-09-14 – 2018-09-15 (×2): 3 mL via INTRAVENOUS
  Filled 2018-09-13 (×2): qty 3

## 2018-09-13 MED ORDER — AMLODIPINE BESYLATE 5 MG PO TABS
5.0000 mg | ORAL_TABLET | Freq: Every day | ORAL | Status: DC
  Administered 2018-09-13 – 2018-09-20 (×8): 5 mg via ORAL
  Filled 2018-09-13 (×8): qty 1

## 2018-09-13 NOTE — Progress Notes (Signed)
Dr. Janna ArchonDiego on unit to see patient.  Notified that patient's blood glucose was 445.  Novolog scale changed to moderate.  Gave order to give Novolog 15 units and the Novolog 70/30 and change diet to Carb Modified.  Patient alert and oriented eating lunch.

## 2018-09-13 NOTE — ED Notes (Signed)
I-stat troponin 0.00 

## 2018-09-13 NOTE — Progress Notes (Addendum)
Bronchitis from upper respiratory infection. Most likely virus. Yellow secretions being cough up . 2nd infection. Wheezing mostly upper with diminished breath sounds with wheezes transmitted from above. Patients states can't sleep at night frough coughing and upper airway  drainage.

## 2018-09-13 NOTE — Plan of Care (Signed)
Patient verbalizes understanding of diabetes management and diet restrictions.  Ambulated to bathroom without difficulty and denies shortness of breath.

## 2018-09-13 NOTE — ED Triage Notes (Signed)
Pt c/o cough, sob that became worse tonight, recently treated for bronchitis last week with a zpack, c/o pain to lower back that is chronic. Pt states that her pulse ox was running in mid 70's to 90's at home,

## 2018-09-13 NOTE — H&P (Signed)
TRH H&P    Patient Demographics:    Armani Gawlik, is a 82 y.o. female  MRN: [id number]  DOB - [birth year]  Admit Date - 09/13/2018  Referring MD/NP/PA: Dr. Stark Jock  Outpatient Primary MD for the patient is Lucia Gaskins, MD  Patient coming from: Home  Chief complaint-shortness of breath   HPI:    Shadell Brenn  is a 82 y.o. female, with history of CVA, hypertension, hyperlipidemia, hypothyroidism, diabetes mellitus type 2 came to hospital with worsening shortness of breath.  Patient also has been coughing up for past 3 days.  Also coughing up productive phlegm.  She complains of chest pain due to coughing.  Denies nausea vomiting or diarrhea. Denies fever or chills. Patient gave herself 5 nebulizer treatments last evening with not much relief. She has previous history of stroke Denies history of seizures.    Review of systems:    In addition to the HPI above,   All other systems reviewed and are negative.   With Past History of the following :    Past Medical History:  Diagnosis Date  . Arthritis   . CAD (coronary artery disease)    Multivessel status post CABG 2001  . Chronic obstructive pulmonary disease ([place])   . DDD (degenerative disc disease), lumbar   . Diastolic CHF ([place])   . Essential hypertension   . Hematuria   . History of CVA (cerebrovascular accident)    2013 - lacunar infarct right thalamus - residual left side of  lip numb  and left eye vision worse (which corrented lens implant from cataract extraction)  . History of MI (myocardial infarction)    July 2001  . Hyperlipidemia   . Hypothyroidism   . Insulin dependent type 2 diabetes mellitus ([place])   . Lesion of bladder   . Lower urinary tract symptoms (LUTS)   . OSA (obstructive sleep apnea)   . Peripheral neuropathy   . Psoriasis   . SUI (stress urinary incontinence, female)   . Varicose veins       Past Surgical  History:  Procedure Laterality Date  . APPENDECTOMY  age 60  . CARDIAC CATHETERIZATION  1991   No sig. cad  . CARDIAC CATHETERIZATION  07-01-2000   high grade in-stent restenosis  . CARDIAC CATHETERIZATION  04-30-2001   single native vessel cad/ graft patent  . CARDIAC CATHETERIZATION  02-13-2006  dr Danae Orleans cad with total occluded LAD  and  70% ostial of small ramus/  grafts patent/  normal ef  . CARDIAC CATHETERIZATION  01-20-2000  dr Angelena Form   essentially unchanged;  severe single vessel cad with diffuse disease throughout CFX and RCA/ ef 66%;  chf with preserved LVSF  . CARDIOVASCULAR STRESS TEST  08-28-2011   DR ROTHBART   NEGATIVE NUCLEAR STUDY/  GLOBAL LVSF/  EF 65%  . CARPAL TUNNEL RELEASE Bilateral yrs ago  . CATARACT EXTRACTION W/ INTRAOCULAR LENS  IMPLANT, BILATERAL  2013  . CHOLECYSTECTOMY  1990  . COLONOSCOPY N/A 07/26/2015   Procedure: COLONOSCOPY;  Surgeon:  Rogene Houston, MD;  [facility];  Service: Endoscopy;  Laterality: N/A;  1200  . CORONARY ANGIOPLASTY  11-13-1999   balloon angioplasty to mLAD , d2 of LAD  . CORONARY ANGIOPLASTY WITH STENT PLACEMENT  01-17-2000   PCI stenting to mLAD & D2 of LAD  . CORONARY ARTERY BYPASS GRAFT  07-02-2000   DR Darylene Price   SVG to diagonal, LIMA to LAD  . CYSTOSCOPY W/ RETROGRADES Bilateral 08/09/2014   Procedure: CYSTOSCOPY, BILATERAL RETROGRADE, HYDRODISTENSION, BLADDER BIOPSY WITH FULGERATION, INSTILL PYRIDIUM AND MARCAINE ;  Surgeon: Malka So, MD;  Location[facility];  Service: Urology;  Laterality: Bilateral;  . KNEE ARTHROSCOPY Left 10-18-2003   SYNOVECTOMY  . LUMBAR SPINE SURGERY  x2    yrs ago  . ORIF HUMERUS FRACTURE Right 04-18-2008  . REPAIR FLEXOR TENDON HAND Right 09-20-2008   CARPI RADIALIS TENDON TRANSFER TO EXTENSOR TO INDEX, MIDDLE, RING , LITTLE FINGERS AND DIGITI MININI  . ROTATOR CUFF REPAIR Right 1995  . TOTAL ABDOMINAL HYSTERECTOMY W/ BILATERAL SALPINGOOPHORECTOMY  age  59  . TRANSTHORACIC ECHOCARDIOGRAM  12-12-2013   MILD LVH/  EF 40-98%/  GRADE I DIASTOLIC DYSFUNCTION/  MILD LAE      Social History:      Social History   Tobacco Use  . Smoking status: Never Smoker  . Smokeless tobacco: Never Used  Substance Use Topics  . Alcohol use: No       Family History :     Family History  Problem Relation Age of Onset  . Hypertension Mother   . Stroke Mother   . Coronary artery disease Unknown        Multiple first and second-degree relatives  . Aneurysm Unknown        Cerebral circulation  . Colon cancer Sister   . Stroke Sister   . Coronary artery disease Sister   . Clotting disorder Unknown        Children diagnosed with hypercoagulable state  . Coronary artery disease Brother       Home Medications:   Prior to Admission medications   Medication Sig Start Date End Date Taking? Authorizing Provider  amLODipine (NORVASC) 5 MG tablet Take 5 mg by mouth daily. 07/03/17   [provider]  aspirin EC 325 MG tablet Take 325 mg by mouth daily.    [provider]  azithromycin (ZITHROMAX) 250 MG tablet  09/11/18   [provider]  Dextromethorphan-guaiFENesin (MUCUS RELIEF DM) 30-600 MG TB12 Take 1 tablet by mouth every 12 (twelve) hours. 08/21/18   [provider]  gabapentin (NEURONTIN) 300 MG capsule Take 300 mg by mouth 2 (two) times daily. 09/18/17   [provider]  insulin NPH-regular Human (NOVOLIN 70/30) (70-30) 100 UNIT/ML injection Inject 40 Units into the skin 2 (two) times daily.    [provider]  ipratropium-albuterol (DUONEB) 0.5-2.5 (3) MG/3ML SOLN Inhale 3 mLs into the lungs 4 (four) times daily as needed. 09/19/17   [provider]  levothyroxine (SYNTHROID, LEVOTHROID) 25 MCG tablet Take 25 mcg by mouth daily.     [provider]  lisinopril (PRINIVIL,ZESTRIL) 10 MG tablet Take 10 mg by mouth daily.    [provider]  metFORMIN (GLUCOPHAGE) 500 MG  tablet Take 500 mg by mouth 2 (two) times daily with a meal.      [provider]  nitroGLYCERIN (NITROSTAT) 0.4 MG SL tablet Place 0.4 mg under the tongue every 5 (five)  minutes as needed for chest pain.     [provider]  oxyCODONE (OXY IR/ROXICODONE) 5 MG immediate release tablet Take 1 tablet (5 mg total) by mouth every 4 (four) hours as needed (Moderate pain). 06/12/18   Lucia Gaskins, MD  phenazopyridine (PYRIDIUM) 100 MG tablet Take 1 tablet (100 mg total) by mouth 3 (three) times daily with meals. 09/01/18   Lucia Gaskins, MD  predniSONE (DELTASONE) 20 MG tablet TAKE 3 TABS X 7 DAYS, THEN 2 TABS X 7 DAYS, THEN 1 TAB X 7 DAYS 09/08/18   [provider]  sulfamethoxazole-trimethoprim (BACTRIM DS,SEPTRA DS) 800-160 MG tablet Take 1 tablet by mouth daily. 09/02/18   Lucia Gaskins, MD  triamcinolone cream (KENALOG) 0.1 % APPLY TO AFFECTED AREA TWICE A DAY AS DIRECTED FOR FLARES 05/04/18   [provider]     Allergies:     Allergies  Allergen Reactions  . Codeine Itching     Physical Exam:   Vitals  Blood pressure 126/67, pulse 86, temperature 98.3 F (36.8 C), temperature source Oral, resp. rate 15, height 5' 4.5" (1.638 m), weight 81.6 kg, SpO2 96 %.  1.  General: Appears in no acute distress  2. Psychiatric:  Intact judgement and  insight, awake alert, oriented x 3.  3. Neurologic: No focal neurological deficits, all cranial nerves intact.Strength 5/5 all 4 extremities, sensation intact all 4 extremities, plantars down going.  4. Eyes :  anicteric sclerae, moist conjunctivae with no lid lag. PERRLA.  5. ENMT:  Oropharynx clear with moist mucous membranes and good dentition  6. Neck:  supple, no cervical lymphadenopathy appriciated, No thyromegaly  7. Respiratory : Normal respiratory effort, bilateral wheezing auscultated  8. Cardiovascular : RRR, no gallops, rubs or murmurs, no leg edema  9. Gastrointestinal:    Positive bowel sounds, abdomen soft, non-tender to palpation,no hepatosplenomegaly, no rigidity or guarding       10. Skin:  No cyanosis, normal texture and turgor, no rash, lesions or ulcers  11.Musculoskeletal:  Good muscle tone,  joints appear normal , no effusions,  normal range of motion    Data Review:    CBC Recent Labs  Lab 09/13/18 0026  WBC 12.7*  HGB 14.2  HCT 45.6  PLT 318  MCV 82.8  MCH 25.8*  MCHC 31.1  RDW 15.7*  LYMPHSABS 4.5*  MONOABS 1.1*  EOSABS 1.0*  BASOSABS 0.1   ------------------------------------------------------------------------------------------------------------------  Results for orders placed or performed during the hospital encounter of 09/13/18 (from the past 48 hour(s))  Basic metabolic panel     Status: None   Collection Time: 09/13/18 12:26 AM  Result Value Ref Range   Sodium 141 135 - 145 mmol/L   Potassium 3.8 3.5 - 5.1 mmol/L   Chloride 104 98 - 111 mmol/L   CO2 31 22 - 32 mmol/L   Glucose, Bld 75 70 - 99 mg/dL   BUN 10 8 - 23 mg/dL   Creatinine, Ser 0.75 0.44 - 1.00 mg/dL   Calcium 10.0 8.9 - 10.3 mg/dL   GFR calc non Af Amer >60 >60 mL/min   GFR calc Af Amer >60 >60 mL/min    Comment: (NOTE) The eGFR has been calculated using the CKD EPI equation. This calculation has not been validated in all clinical situations. eGFR's persistently <60 mL/min signify possible Chronic Kidney Disease.    Anion gap 6 5 - 15    Comment: Performed at [facility], [address], [place], [place] [address]  CBC  with Differential     Status: Abnormal   Collection Time: 09/13/18 12:26 AM  Result Value Ref Range   WBC 12.7 (H) 4.0 - 10.5 K/uL   RBC 5.51 (H) 3.87 - 5.11 MIL/uL   Hemoglobin 14.2 12.0 - 15.0 g/dL   HCT 45.6 36.0 - 46.0 %   MCV 82.8 78.0 - 100.0 fL   MCH 25.8 (L) 26.0 - 34.0 pg   MCHC 31.1 30.0 - 36.0 g/dL   RDW 15.7 (H) 11.5 - 15.5 %   Platelets 318 150 - 400 K/uL   Neutrophils Relative % 48 %   Neutro Abs 6.0 1.7 -  7.7 K/uL   Lymphocytes Relative 35 %   Lymphs Abs 4.5 (H) 0.7 - 4.0 K/uL   Monocytes Relative 9 %   Monocytes Absolute 1.1 (H) 0.1 - 1.0 K/uL   Eosinophils Relative 8 %   Eosinophils Absolute 1.0 (H) 0.0 - 0.7 K/uL   Basophils Relative 0 %   Basophils Absolute 0.1 0.0 - 0.1 K/uL    Comment: Performed at [facility], [address], [place], [place] [address]  Brain natriuretic peptide     Status: None   Collection Time: 09/13/18 12:26 AM  Result Value Ref Range   B Natriuretic Peptide 63.0 0.0 - 100.0 pg/mL    Comment: Performed at [facility], [address], [place], [place] [address]  I-stat troponin, ED     Status: None   Collection Time: 09/13/18 12:37 AM  Result Value Ref Range   Troponin i, poc 0.00 0.00 - 0.08 ng/mL   Comment 3            Comment: Due to the release kinetics of cTnI, a negative result within the first hours of the onset of symptoms does not rule out myocardial infarction with certainty. If myocardial infarction is still suspected, repeat the test at appropriate intervals.     Chemistries  Recent Labs  Lab 09/13/18 0026  NA 141  K 3.8  CL 104  CO2 31  GLUCOSE 75  BUN 10  CREATININE 0.75  CALCIUM 10.0   ------------------------------------------------------------------------------------------------------------------  ------------------------------------------------------------------------------------------------------------------ GFR: Estimated Creatinine Clearance: 59.6 mL/min (by C-G formula based on SCr of 0.75 mg/dL). Liver Function Tests: No results for input(s): AST, ALT, ALKPHOS, BILITOT, PROT, ALBUMIN in the last 168 hours. No results for input(s): LIPASE, AMYLASE in the last 168 hours. No results for input(s): AMMONIA in the last 168 hours. Coagulation Profile: No results for input(s): INR, PROTIME in the last 168 hours. Cardiac Enzymes: No results for input(s): CKTOTAL, CKMB, CKMBINDEX, TROPONINI in the last 168 hours. BNP (last  3 results) No results for input(s): PROBNP in the last 8760 hours. HbA1C: No results for input(s): HGBA1C in the last 72 hours. CBG: No results for input(s): GLUCAP in the last 168 hours. Lipid Profile: No results for input(s): CHOL, HDL, LDLCALC, TRIG, CHOLHDL, LDLDIRECT in the last 72 hours. Thyroid Function Tests: No results for input(s): TSH, T4TOTAL, FREET4, T3FREE, THYROIDAB in the last 72 hours. Anemia Panel: No results for input(s): VITAMINB12, FOLATE, FERRITIN, TIBC, IRON, RETICCTPCT in the last 72 hours.  --------------------------------------------------------------------------------------------------------------- Urine analysis:    Component Value Date/Time   COLORURINE YELLOW 08/27/2018 2359   APPEARANCEUR HAZY (A) 08/27/2018 2359   LABSPEC >1.046 (H) 08/27/2018 2359   PHURINE 6.0 08/27/2018 2359   GLUCOSEU >=500 (A) 08/27/2018 2359   HGBUR NEGATIVE 08/27/2018 [place] 08/27/2018 [place] 08/27/2018 2359   PROTEINUR  NEGATIVE 08/27/2018 2359   UROBILINOGEN 0.2 08/29/2015 1842   NITRITE POSITIVE (A) 08/27/2018 2359   LEUKOCYTESUR LARGE (A) 08/27/2018 2359      Imaging Results:    Dg Chest 2 View  Result Date: 09/13/2018 CLINICAL DATA:  82 y/o  F; cough and shortness of breath. EXAM: CHEST - 2 VIEW COMPARISON:  06/29/2018 chest radiograph. FINDINGS: Stable normal cardiac silhouette given projection and technique. Status post CABG with median sternotomy wires aligned and intact. Right upper quadrant cholecystectomy clips. Aortic atherosclerosis with calcification. Mild bronchitic changes. No focal consolidation. No pleural effusion or pneumothorax. Right proximal femur rotator cuff repair and humerus fixation hardware, partially visualized. IMPRESSION: 1. Mild bronchitic changes.  No focal consolidation. 2.  Aortic Atherosclerosis (ICD10-I70.0). Electronically Signed   By: Kristine Garbe M.D.   On: 09/13/2018 01:19    My  personal review of EKG: Rhythm NSR   Assessment & Plan:    Active Problems:   COPD exacerbation ([place])   1. COPD exacerbation-start Solu-Medrol 60 mg IV every 6 hours, DuoNeb every 6 hours scheduled.  2. Recent UTI-patient had recent UTI which was treated with Bactrim.  No antibiotics necessary at this time.  3. Diabetes mellitus type 2-continue sliding scale insulin NovoLog.  NovoLog 70/30, 40 units subcu twice daily  4. Hypothyroidism-continue Synthroid  5. Hypertension-blood pressure stable, continue amlodipine, lisinopril    DVT Prophylaxis-   Lovenox   AM Labs Ordered, also please review Full Orders  Family Communication: Admission, patients condition and plan of care including tests being ordered have been discussed with the patient * who indicate understanding and agree with the plan and Code Status.  Code Status: Full code  Admission status: Observation  Time spent in minutes : 60 minutes   Oswald Hillock M.D on 09/13/2018 at 5:23 AM  Between 7am to 7pm - Pager - [phone]. After 7pm go to [email or web address] - password [facility]  [facility] - Office  [phone]

## 2018-09-13 NOTE — ED Provider Notes (Signed)
[facility] EMERGENCY DEPARTMENT Provider Note   CSN: [id number] Arrival date & time: 09/13/18  0008     History   Chief Complaint Chief Complaint  Patient presents with  . Cough    HPI [name] [name] is a 82 y.o. female.  Patient is a 82 year old female with extensive past medical history including insulin-dependent diabetes, coronary artery disease status post CABG, COPD, CHF, hypertension.  She presents today with complaints of chest congestion, productive cough, and wheezing.  This is worsened over the past 3 days.  She denies any fevers, chills, or chest pain.  She denies any increase in leg swelling.  She has a nebulizer at home and has given herself 5 nebulizer treatments this evening with little relief.  He was recently admitted to the hospital for TIA-like symptoms.  The history is provided by the patient.  Cough  This is a new problem. Episode onset: 3 days ago. The problem occurs constantly. The problem has been rapidly worsening. The cough is productive of sputum. There has been no fever. Associated symptoms include shortness of breath and wheezing. Pertinent negatives include no chills. Treatments tried: Nebulizer treatments. The treatment provided no relief.    Past Medical History:  Diagnosis Date  . Arthritis   . CAD (coronary artery disease)    Multivessel status post CABG 2001  . Chronic obstructive pulmonary disease (HCC)   . DDD (degenerative disc disease), lumbar   . Diastolic CHF (HCC)   . Essential hypertension   . Hematuria   . History of CVA (cerebrovascular accident)    2013 - lacunar infarct right thalamus - residual left side of  lip numb  and left eye vision worse (which corrented lens implant from cataract extraction)  . History of MI (myocardial infarction)    July 2001  . Hyperlipidemia   . Hypothyroidism   . Insulin dependent type 2 diabetes mellitus (HCC)   . Lesion of bladder   . Lower urinary tract symptoms (LUTS)   . OSA  (obstructive sleep apnea)   . Peripheral neuropathy   . Psoriasis   . SUI (stress urinary incontinence, female)   . Varicose veins     Patient Active Problem List   Diagnosis Date Noted  . Syncope   . Loss of consciousness (HCC) 06/10/2018  . Hyperglycemia 06/10/2018  . Bradycardia, sinus 06/10/2018  . Diastolic dysfunction 09/29/2017  . CAD (coronary artery disease) 09/28/2017  . Acute lower UTI 09/28/2017  . Ischemic stroke (HCC) 05/10/2017  . Acute ischemic stroke (HCC) 05/10/2017  . Stroke (HCC) 08/01/2016  . Dysarthria 03/18/2016  . Anemia of chronic disease 11/19/2015  . OSA (obstructive sleep apnea) 11/19/2015  . Left facial numbness 08/29/2015  . Gait instability 06/26/2015  . COPD exacerbation (HCC) 11/13/2014  . Weakness generalized 12/10/2013  . TIA (transient ischemic attack) 06/22/2012  . Left-sided headache 06/22/2012  . Noncompliance 06/22/2012  . Noncompliance with CPAP treatment 06/22/2012  . Psoriasis 01/07/2012  . Cerebrovascular disease   . Chronic back pain   . Chronic obstructive pulmonary disease (HCC)   . DM type 2 causing complication (HCC) 02/14/2010  . Obesity 02/14/2010  . Arteriosclerotic cardiovascular disease (ASCVD) 02/14/2010  . Hypothyroidism 02/06/2010  . Hyperlipidemia 02/06/2010  . Essential hypertension 02/06/2010  . Sleep apnea 02/06/2010    Past Surgical History:  Procedure Laterality Date  . APPENDECTOMY  age 76  . CARDIAC CATHETERIZATION  1991   No sig. cad  . CARDIAC CATHETERIZATION  07-01-2000   high  grade in-stent restenosis  . CARDIAC CATHETERIZATION  04-30-2001   single native vessel cad/ graft patent  . CARDIAC CATHETERIZATION  02-13-2006  dr Jenness Cornerbrodie   2V cad with total occluded LAD  and  70% ostial of small ramus/  grafts patent/  normal ef  . CARDIAC CATHETERIZATION  01-20-2000  dr Clifton Jamesmcalhany   essentially unchanged;  severe single vessel cad with diffuse disease throughout CFX and RCA/ ef 66%;  chf with preserved  LVSF  . CARDIOVASCULAR STRESS TEST  08-28-2011   DR ROTHBART   NEGATIVE NUCLEAR STUDY/  GLOBAL LVSF/  EF 65%  . CARPAL TUNNEL RELEASE Bilateral yrs ago  . CATARACT EXTRACTION W/ INTRAOCULAR LENS  IMPLANT, BILATERAL  2013  . CHOLECYSTECTOMY  1990  . COLONOSCOPY N/A 07/26/2015   Procedure: COLONOSCOPY;  Surgeon: Malissa HippoNajeeb U Rehman, MD;  Location: [facility];  Service: Endoscopy;  Laterality: N/A;  1200  . CORONARY ANGIOPLASTY  11-13-1999   balloon angioplasty to mLAD , d2 of LAD  . CORONARY ANGIOPLASTY WITH STENT PLACEMENT  01-17-2000   PCI stenting to mLAD & D2 of LAD  . CORONARY ARTERY BYPASS GRAFT  07-02-2000   DR Tressie StalkerLARENCE OWEN   SVG to diagonal, LIMA to LAD  . CYSTOSCOPY W/ RETROGRADES Bilateral 08/09/2014   Procedure: CYSTOSCOPY, BILATERAL RETROGRADE, HYDRODISTENSION, BLADDER BIOPSY WITH FULGERATION, INSTILL PYRIDIUM AND MARCAINE ;  Surgeon: Anner CreteJohn J Wrenn, MD;  [facility];  Service: Urology;  Laterality: Bilateral;  . KNEE ARTHROSCOPY Left 10-18-2003   SYNOVECTOMY  . LUMBAR SPINE SURGERY  x2    yrs ago  . ORIF HUMERUS FRACTURE Right 04-18-2008  . REPAIR FLEXOR TENDON HAND Right 09-20-2008   CARPI RADIALIS TENDON TRANSFER TO EXTENSOR TO INDEX, MIDDLE, RING , LITTLE FINGERS AND DIGITI MININI  . ROTATOR CUFF REPAIR Right 1995  . TOTAL ABDOMINAL HYSTERECTOMY W/ BILATERAL SALPINGOOPHORECTOMY  age 82  . TRANSTHORACIC ECHOCARDIOGRAM  12-12-2013   MILD LVH/  EF 60-65%/  GRADE I DIASTOLIC DYSFUNCTION/  MILD LAE     OB History    Gravida  7   Para  7   Term  7   Preterm      AB      Living  4     SAB      TAB      Ectopic      Multiple      Live Births               Home Medications    Prior to Admission medications   Medication Sig Start Date End Date Taking? Authorizing Provider  amLODipine (NORVASC) 5 MG tablet Take 5 mg by mouth daily. 07/03/17   [provider]  aspirin EC 325 MG tablet Take 325 mg by mouth daily.    [provider]  azithromycin (ZITHROMAX) 250 MG tablet  09/11/18   [provider]  Dextromethorphan-guaiFENesin (MUCUS RELIEF DM) 30-600 MG TB12 Take 1 tablet by mouth every 12 (twelve) hours. 08/21/18   [provider]  gabapentin (NEURONTIN) 300 MG capsule Take 300 mg by mouth 2 (two) times daily. 09/18/17   [provider]  insulin NPH-regular Human (NOVOLIN 70/30) (70-30) 100 UNIT/ML injection Inject 40 Units into the skin 2 (two) times daily.    [provider]  ipratropium-albuterol (DUONEB) 0.5-2.5 (3) MG/3ML SOLN Inhale 3 mLs into the lungs 4 (four) times daily as needed. 09/19/17   [provider]  levothyroxine (SYNTHROID, LEVOTHROID) 25 MCG  tablet Take 25 mcg by mouth daily.     [provider]  lisinopril (PRINIVIL,ZESTRIL) 10 MG tablet Take 10 mg by mouth daily.    [provider]  metFORMIN (GLUCOPHAGE) 500 MG tablet Take 500 mg by mouth 2 (two) times daily with a meal.      [provider]  nitroGLYCERIN (NITROSTAT) 0.4 MG SL tablet Place 0.4 mg under the tongue every 5 (five) minutes as needed for chest pain.     [provider]  oxyCODONE (OXY IR/ROXICODONE) 5 MG immediate release tablet Take 1 tablet (5 mg total) by mouth every 4 (four) hours as needed (Moderate pain). 06/12/18   Oval Linsey, MD  phenazopyridine (PYRIDIUM) 100 MG tablet Take 1 tablet (100 mg total) by mouth 3 (three) times daily with meals. 09/01/18   Oval Linsey, MD  predniSONE (DELTASONE) 20 MG tablet TAKE 3 TABS X 7 DAYS, THEN 2 TABS X 7 DAYS, THEN 1 TAB X 7 DAYS 09/08/18   [provider]  sulfamethoxazole-trimethoprim (BACTRIM DS,SEPTRA DS) 800-160 MG tablet Take 1 tablet by mouth daily. 09/02/18   Oval Linsey, MD  triamcinolone cream (KENALOG) 0.1 % APPLY TO AFFECTED AREA TWICE A DAY AS DIRECTED FOR FLARES 05/04/18   [provider]    Family History Family History  Problem Relation Age of Onset  .  Hypertension Mother   . Stroke Mother   . Coronary artery disease Unknown        Multiple first and second-degree relatives  . Aneurysm Unknown        Cerebral circulation  . Colon cancer Sister   . Stroke Sister   . Coronary artery disease Sister   . Clotting disorder Unknown        Children diagnosed with hypercoagulable state  . Coronary artery disease Brother     Social History Social History   Tobacco Use  . Smoking status: Never Smoker  . Smokeless tobacco: Never Used  Substance Use Topics  . Alcohol use: No  . Drug use: No     Allergies   Codeine   Review of Systems Review of Systems  Constitutional: Negative for chills.  Respiratory: Positive for cough, shortness of breath and wheezing.   All other systems reviewed and are negative.    Physical Exam Updated Vital Signs Ht 5' 4.5" (1.638 m)   Wt 81.6 kg   BMI 30.42 kg/m   Physical Exam  Constitutional: She is oriented to person, place, and time. She appears well-developed and well-nourished. No distress.  HENT:  Head: Normocephalic and atraumatic.  Neck: Normal range of motion. Neck supple.  Cardiovascular: Normal rate and regular rhythm. Exam reveals no gallop and no friction rub.  No murmur heard. Pulmonary/Chest: She is in respiratory distress. She has wheezes.  There is bilateral wheezing present.  She appears to be in mild respiratory distress and somewhat anxious.  Abdominal: Soft. Bowel sounds are normal. She exhibits no distension. There is no tenderness.  Musculoskeletal: Normal range of motion. She exhibits no edema or tenderness.  Neurological: She is alert and oriented to person, place, and time.  Skin: Skin is warm and dry. She is not diaphoretic.  Nursing note and vitals reviewed.    ED Treatments / Results  Labs (all labs ordered are listed, but only abnormal results are displayed) Labs Reviewed - No data to display  EKG EKG Interpretation  Date/Time:  Sunday September 13 2018 00:33:31 EDT Ventricular Rate:  83 PR Interval:  QRS Duration: 104 QT Interval:  384 QTC Calculation: 452 R Axis:   -64 Text Interpretation:  Sinus rhythm Left anterior fascicular block LVH with secondary repolarization abnormality Anterior infarct, old Confirmed by Geoffery Lyons ([id number]) on 09/13/2018 12:46:20 AM   Radiology No results found.  Procedures Procedures (including critical care time)  Medications Ordered in ED Medications  ipratropium-albuterol (DUONEB) 0.5-2.5 (3) MG/3ML nebulizer solution 3 mL (has no administration in time range)  ipratropium-albuterol (DUONEB) 0.5-2.5 (3) MG/3ML nebulizer solution 3 mL (has no administration in time range)  methylPREDNISolone sodium succinate (SOLU-MEDROL) 125 mg/2 mL injection 125 mg (has no administration in time range)     Initial Impression / Assessment and Plan / ED Course  I have reviewed the triage vital signs and the nursing notes.  Pertinent labs & imaging results that were available during my care of the patient were reviewed by me and considered in my medical decision making (see chart for details).  Patient presents with wheezing and difficulty breathing.  Based on her laboratory studies and remainder of work-up, this appears to be an exacerbation of COPD.  She was given multiple breathing treatments, intravenous steroids, however continues to wheeze.  She here initially had oxygen saturations of 91%, however improved with nasal cannula.  I feel as though admission is indicated.  I have spoken with Dr. Sharl Ma who agrees to admit.  CRITICAL CARE Performed by: Geoffery Lyons Total critical care time: 35 minutes Critical care time was exclusive of separately billable procedures and treating other patients. Critical care was necessary to treat or prevent imminent or life-threatening deterioration. Critical care was time spent personally by me on the following activities: development of treatment plan with patient and/or  surrogate as well as nursing, discussions with consultants, evaluation of patient's response to treatment, examination of patient, obtaining history from patient or surrogate, ordering and performing treatments and interventions, ordering and review of laboratory studies, ordering and review of radiographic studies, pulse oximetry and re-evaluation of patient's condition.   Final Clinical Impressions(s) / ED Diagnoses   Final diagnoses:  None    ED Discharge Orders    None       Geoffery Lyons, MD 09/13/18 [phone]

## 2018-09-13 NOTE — Progress Notes (Addendum)
Patient seen and evaluated, chart reviewed, please see EMR for updated orders. Please see full H&P dictated by admitting physician Dr Sharl MaLama for same date of service.   Brief summary 82 y.o. female, with history of CVA, hypertension, hyperlipidemia, hypothyroidism, DM2 and COPD admitted on 09/13/2018 with shortness of breath, wheezing and productive cough. Found to have COPD exacerbation without frank pneumonia  Plan:- 1) acute COPD exacerbation--- cough, wheezing and  shortness of breath persist, patient failed outpatient treatment with frequent nebulizer treatments, treat empirically with IV Solu-Medrol, Mucinex, bronchodilators and doxycycline.  Patient with significant tobacco exposure at home with multiple smokers even though she does not smoke  2)DM2-poor control at baseline, A1c was 8.4 couple weeks ago , anticipate worsening glycemic control with steroids, continue home  insulin regimen  And Use Novolog/Humalog Sliding scale insulin with Accu-Cheks/Fingersticks as ordered  3)HTN--stable BP, continue amlodipine 5 mg daily and lisinopril 10 mg daily,  may use IV Hydralazine 10 mg  Every 4 hours Prn for systolic blood pressure over 160 mmhg  4)Hypothyroidism--TSH was 2.5 in June 2019, restart levothyroxine 25 mcg daily   This is a patient of Dr. Oval Linseyichard [name]------ with transfer service to PCPs team (Dr Janna Archondiego)   Patient seen and evaluated, chart reviewed, please see EMR for updated orders. Please see full H&P dictated by admitting physician Dr Sharl MaLama for same date of service.

## 2018-09-14 ENCOUNTER — Encounter (HOSPITAL_COMMUNITY): Payer: Self-pay

## 2018-09-14 DIAGNOSIS — Z90722 Acquired absence of ovaries, bilateral: Secondary | ICD-10-CM | POA: Diagnosis not present

## 2018-09-14 DIAGNOSIS — I252 Old myocardial infarction: Secondary | ICD-10-CM | POA: Diagnosis not present

## 2018-09-14 DIAGNOSIS — N390 Urinary tract infection, site not specified: Secondary | ICD-10-CM | POA: Diagnosis present

## 2018-09-14 DIAGNOSIS — Z9119 Patient's noncompliance with other medical treatment and regimen: Secondary | ICD-10-CM | POA: Diagnosis not present

## 2018-09-14 DIAGNOSIS — I5032 Chronic diastolic (congestive) heart failure: Secondary | ICD-10-CM | POA: Diagnosis present

## 2018-09-14 DIAGNOSIS — E1142 Type 2 diabetes mellitus with diabetic polyneuropathy: Secondary | ICD-10-CM | POA: Diagnosis present

## 2018-09-14 DIAGNOSIS — I110 Hypertensive heart disease with heart failure: Secondary | ICD-10-CM | POA: Diagnosis present

## 2018-09-14 DIAGNOSIS — Z9049 Acquired absence of other specified parts of digestive tract: Secondary | ICD-10-CM | POA: Diagnosis not present

## 2018-09-14 DIAGNOSIS — J441 Chronic obstructive pulmonary disease with (acute) exacerbation: Secondary | ICD-10-CM | POA: Diagnosis present

## 2018-09-14 DIAGNOSIS — M1990 Unspecified osteoarthritis, unspecified site: Secondary | ICD-10-CM | POA: Diagnosis present

## 2018-09-14 DIAGNOSIS — I2510 Atherosclerotic heart disease of native coronary artery without angina pectoris: Secondary | ICD-10-CM | POA: Diagnosis present

## 2018-09-14 DIAGNOSIS — E039 Hypothyroidism, unspecified: Secondary | ICD-10-CM | POA: Diagnosis present

## 2018-09-14 DIAGNOSIS — Y92230 Patient room in hospital as the place of occurrence of the external cause: Secondary | ICD-10-CM | POA: Diagnosis not present

## 2018-09-14 DIAGNOSIS — Z9842 Cataract extraction status, left eye: Secondary | ICD-10-CM | POA: Diagnosis not present

## 2018-09-14 DIAGNOSIS — Z794 Long term (current) use of insulin: Secondary | ICD-10-CM | POA: Diagnosis not present

## 2018-09-14 DIAGNOSIS — Z8249 Family history of ischemic heart disease and other diseases of the circulatory system: Secondary | ICD-10-CM | POA: Diagnosis not present

## 2018-09-14 DIAGNOSIS — Z885 Allergy status to narcotic agent status: Secondary | ICD-10-CM | POA: Diagnosis not present

## 2018-09-14 DIAGNOSIS — Z90710 Acquired absence of both cervix and uterus: Secondary | ICD-10-CM | POA: Diagnosis not present

## 2018-09-14 DIAGNOSIS — Z951 Presence of aortocoronary bypass graft: Secondary | ICD-10-CM | POA: Diagnosis not present

## 2018-09-14 DIAGNOSIS — I8390 Asymptomatic varicose veins of unspecified lower extremity: Secondary | ICD-10-CM | POA: Diagnosis present

## 2018-09-14 DIAGNOSIS — Z961 Presence of intraocular lens: Secondary | ICD-10-CM | POA: Diagnosis present

## 2018-09-14 DIAGNOSIS — E785 Hyperlipidemia, unspecified: Secondary | ICD-10-CM | POA: Diagnosis present

## 2018-09-14 DIAGNOSIS — G4733 Obstructive sleep apnea (adult) (pediatric): Secondary | ICD-10-CM | POA: Diagnosis present

## 2018-09-14 DIAGNOSIS — Z9841 Cataract extraction status, right eye: Secondary | ICD-10-CM | POA: Diagnosis not present

## 2018-09-14 DIAGNOSIS — Z8673 Personal history of transient ischemic attack (TIA), and cerebral infarction without residual deficits: Secondary | ICD-10-CM | POA: Diagnosis not present

## 2018-09-14 LAB — CBC
HEMATOCRIT: 40.9 % (ref 36.0–46.0)
HEMOGLOBIN: 12.5 g/dL (ref 12.0–15.0)
MCH: 25.3 pg — ABNORMAL LOW (ref 26.0–34.0)
MCHC: 30.6 g/dL (ref 30.0–36.0)
MCV: 82.6 fL (ref 78.0–100.0)
Platelets: 331 10*3/uL (ref 150–400)
RBC: 4.95 MIL/uL (ref 3.87–5.11)
RDW: 15.9 % — ABNORMAL HIGH (ref 11.5–15.5)
WBC: 16.4 10*3/uL — AB (ref 4.0–10.5)

## 2018-09-14 LAB — COMPREHENSIVE METABOLIC PANEL
ALK PHOS: 76 U/L (ref 38–126)
ALT: 11 U/L (ref 0–44)
AST: 13 U/L — AB (ref 15–41)
Albumin: 3.7 g/dL (ref 3.5–5.0)
Anion gap: 10 (ref 5–15)
BILIRUBIN TOTAL: 0.6 mg/dL (ref 0.3–1.2)
BUN: 23 mg/dL (ref 8–23)
CALCIUM: 9.8 mg/dL (ref 8.9–10.3)
CO2: 25 mmol/L (ref 22–32)
CREATININE: 0.82 mg/dL (ref 0.44–1.00)
Chloride: 103 mmol/L (ref 98–111)
Glucose, Bld: 286 mg/dL — ABNORMAL HIGH (ref 70–99)
Potassium: 5.1 mmol/L (ref 3.5–5.1)
Sodium: 138 mmol/L (ref 135–145)
Total Protein: 7 g/dL (ref 6.5–8.1)

## 2018-09-14 LAB — HEMOGLOBIN A1C
HEMOGLOBIN A1C: 9 % — AB (ref 4.8–5.6)
MEAN PLASMA GLUCOSE: 211.6 mg/dL

## 2018-09-14 LAB — GLUCOSE, CAPILLARY
GLUCOSE-CAPILLARY: 289 mg/dL — AB (ref 70–99)
Glucose-Capillary: 329 mg/dL — ABNORMAL HIGH (ref 70–99)
Glucose-Capillary: 435 mg/dL — ABNORMAL HIGH (ref 70–99)
Glucose-Capillary: 238 mg/dL — ABNORMAL HIGH (ref 70–99)

## 2018-09-14 MED ORDER — GUAIFENESIN-DM 100-10 MG/5ML PO SYRP
15.0000 mL | ORAL_SOLUTION | Freq: Four times a day (QID) | ORAL | Status: DC | PRN
  Administered 2018-09-14 – 2018-09-19 (×12): 15 mL via ORAL
  Filled 2018-09-14 (×13): qty 15

## 2018-09-14 MED ORDER — INSULIN ASPART 100 UNIT/ML ~~LOC~~ SOLN
0.0000 [IU] | Freq: Three times a day (TID) | SUBCUTANEOUS | Status: DC
  Administered 2018-09-14: 7 [IU] via SUBCUTANEOUS
  Administered 2018-09-14: 20 [IU] via SUBCUTANEOUS
  Administered 2018-09-15: 11 [IU] via SUBCUTANEOUS
  Administered 2018-09-15: 4 [IU] via SUBCUTANEOUS
  Administered 2018-09-15: 20 [IU] via SUBCUTANEOUS
  Administered 2018-09-16: 3 [IU] via SUBCUTANEOUS
  Administered 2018-09-16: 15 [IU] via SUBCUTANEOUS
  Administered 2018-09-16 – 2018-09-17 (×2): 4 [IU] via SUBCUTANEOUS
  Administered 2018-09-17: 15 [IU] via SUBCUTANEOUS
  Administered 2018-09-18: 7 [IU] via SUBCUTANEOUS
  Administered 2018-09-18: 15 [IU] via SUBCUTANEOUS
  Administered 2018-09-18: 4 [IU] via SUBCUTANEOUS
  Administered 2018-09-19 (×3): 3 [IU] via SUBCUTANEOUS

## 2018-09-14 MED ORDER — LORAZEPAM 1 MG PO TABS
1.0000 mg | ORAL_TABLET | Freq: Every day | ORAL | Status: DC
  Administered 2018-09-14 – 2018-09-19 (×6): 1 mg via ORAL
  Filled 2018-09-14 (×6): qty 1

## 2018-09-14 MED ORDER — PHENOL 1.4 % MT LIQD
1.0000 | OROMUCOSAL | Status: DC | PRN
  Administered 2018-09-14 – 2018-09-15 (×2): 1 via OROMUCOSAL
  Filled 2018-09-14: qty 177

## 2018-09-14 MED ORDER — INSULIN GLARGINE 100 UNIT/ML ~~LOC~~ SOLN
20.0000 [IU] | Freq: Every day | SUBCUTANEOUS | Status: DC
  Administered 2018-09-14 – 2018-09-16 (×3): 20 [IU] via SUBCUTANEOUS
  Filled 2018-09-14 (×4): qty 0.2

## 2018-09-14 NOTE — Progress Notes (Signed)
Inpatient Diabetes Program Recommendations  AACE/ADA: New Consensus Statement on Inpatient Glycemic Control (2015)  Target Ranges:  Prepandial:   less than 140 mg/dL      Peak postprandial:   less than 180 mg/dL (1-2 hours)      Critically ill patients:  140 - 180 mg/dL   Results for [name] [name], [name] (MRN [id number]) as of 09/14/2018 11:25  Ref. Range 09/13/2018 07:39 09/13/2018 11:23 09/13/2018 14:31 09/13/2018 16:24 09/13/2018 22:08 09/14/2018 07:28 09/14/2018 11:09  Glucose-Capillary Latest Ref Range: 70 - 99 mg/dL 409217 (H) 811445 (H) 914344 (H) 324 (H) 337 (H) 329 (H) 435 (H)   Review of Glycemic Control  Diabetes history: DM 2 Outpatient Diabetes medications: 70/30 40 units BID, Metformin 500 mg BID Current orders for Inpatient glycemic control: 70/30 40 units BID, Novolog 0-15 units tid, Novolog 0-5 units qhs  Inpatient Diabetes Program Recommendations:    Patient receiving IV Solumedrol 40 mg Q8 hours. Glucose trends in the 300-400 range on home insulin. Consider increasing 70/30 dose to 48 units BID while on steroids.  Will need to titrate back down as Steroids are tapered.   Thanks,  Christena DeemShannon Jago Carton RN, MSN, BC-ADM Inpatient Diabetes Coordinator Team Pager [phone] (8a-5p)

## 2018-09-14 NOTE — Progress Notes (Signed)
Patient continues with mild to moderate wheezing inspiratory and expiratory with asthmatic bronchitis currently on Solu-Medrol 40 IV every 8 hours patient has significant hyperglycemia despite insulin we will add Lantus 20 units at bedtime along with sliding scale and NovoLog 70/30 40 ACT AC twice daily [name] [name] [id number] DOB: Apr 22, 1936 DOA: 09/13/2018 PCP: Oval Linseyondiego, Juhi Lagrange, MD   Physical Exam: Blood pressure (!) 112/35, pulse 91, temperature 97.7 F (36.5 C), temperature source Oral, resp. rate 18, height 5' 4.5" (1.638 m), weight 86.5 kg, SpO2 92 %.  Lungs show minimal mild to moderate inspiratory and expiratory wheezes scattered rhonchi no rales audible heart regular rhythm no S3-S4 no heaves or rubs   Investigations:  No results found for this or any previous visit (from the past 240 hour(s)).   Basic Metabolic Panel: Recent Labs    09/13/18 0026 09/13/18 1155 09/14/18 0426  NA 141  --  138  K 3.8  --  5.1  CL 104  --  103  CO2 31  --  25  GLUCOSE 75 438* 286*  BUN 10  --  23  CREATININE 0.75  --  0.82  CALCIUM 10.0  --  9.8   Liver Function Tests: Recent Labs    09/14/18 0426  AST 13*  ALT 11  ALKPHOS 76  BILITOT 0.6  PROT 7.0  ALBUMIN 3.7     CBC: Recent Labs    09/13/18 0026 09/14/18 0426  WBC 12.7* 16.4*  NEUTROABS 6.0  --   HGB 14.2 12.5  HCT 45.6 40.9  MCV 82.8 82.6  PLT 318 331    Dg Chest 2 View  Result Date: 09/13/2018 CLINICAL DATA:  82 y/o  F; cough and shortness of breath. EXAM: CHEST - 2 VIEW COMPARISON:  06/29/2018 chest radiograph. FINDINGS: Stable normal cardiac silhouette given projection and technique. Status post CABG with median sternotomy wires aligned and intact. Right upper quadrant cholecystectomy clips. Aortic atherosclerosis with calcification. Mild bronchitic changes. No focal consolidation. No pleural effusion or pneumothorax. Right proximal femur rotator cuff repair and humerus fixation hardware, partially  visualized. IMPRESSION: 1. Mild bronchitic changes.  No focal consolidation. 2.  Aortic Atherosclerosis (ICD10-I70.0). Electronically Signed   By: Mitzi HansenLance  Furusawa-Stratton M.D.   On: 09/13/2018 01:19      Medications:   Impression:  Principal Problem:   COPD exacerbation (HCC) Active Problems:   Hypothyroidism   DM type 2 causing complication (HCC)   Essential hypertension   Sleep apnea   Noncompliance with CPAP treatment   OSA (obstructive sleep apnea)     Plan: Add Lantus 20 units at bedtime increase sliding scale to resistant continue 70/30 insulin 40 units before meals twice daily  Consultants:    Procedures   Antibiotics:           Time spent: 30 minutes   LOS: 0 days   Pennelope Basque M   09/14/2018, 12:50 PM

## 2018-09-15 LAB — GLUCOSE, CAPILLARY
GLUCOSE-CAPILLARY: 378 mg/dL — AB (ref 70–99)
Glucose-Capillary: 296 mg/dL — ABNORMAL HIGH (ref 70–99)
Glucose-Capillary: 200 mg/dL — ABNORMAL HIGH (ref 70–99)
Glucose-Capillary: 201 mg/dL — ABNORMAL HIGH (ref 70–99)

## 2018-09-15 MED ORDER — PREDNISONE 20 MG PO TABS
20.0000 mg | ORAL_TABLET | Freq: Every day | ORAL | Status: AC
  Administered 2018-09-16 – 2018-09-18 (×3): 20 mg via ORAL
  Filled 2018-09-15 (×3): qty 1

## 2018-09-15 MED ORDER — SULFAMETHOXAZOLE-TRIMETHOPRIM 400-80 MG PO TABS
1.0000 | ORAL_TABLET | Freq: Two times a day (BID) | ORAL | Status: DC
  Filled 2018-09-15 (×3): qty 1

## 2018-09-15 MED ORDER — DOCUSATE SODIUM 100 MG PO CAPS
100.0000 mg | ORAL_CAPSULE | Freq: Every day | ORAL | Status: DC
  Administered 2018-09-16 – 2018-09-20 (×5): 100 mg via ORAL
  Filled 2018-09-15 (×5): qty 1

## 2018-09-15 MED ORDER — SULFAMETHOXAZOLE-TRIMETHOPRIM 800-160 MG PO TABS
1.0000 | ORAL_TABLET | Freq: Two times a day (BID) | ORAL | Status: DC
  Administered 2018-09-15 – 2018-09-16 (×3): 1 via ORAL
  Filled 2018-09-15 (×3): qty 1

## 2018-09-15 MED ORDER — BISACODYL 5 MG PO TBEC
10.0000 mg | DELAYED_RELEASE_TABLET | Freq: Every day | ORAL | Status: DC | PRN
  Administered 2018-09-15 – 2018-09-16 (×2): 10 mg via ORAL
  Filled 2018-09-15 (×4): qty 2

## 2018-09-15 NOTE — Progress Notes (Signed)
Sent msg to MD re laxative for pt.

## 2018-09-15 NOTE — Progress Notes (Signed)
Pt states she had recent uti and was given zpack . She completed only 2 days prior to admit and didn't disclose this information to MD, now having burning and frequency. Notified MD.

## 2018-09-15 NOTE — Plan of Care (Signed)
  Problem: Acute Rehab PT Goals(only PT should resolve) Goal: Patient Will Transfer Sit To/From Stand Outcome: Progressing Flowsheets (Taken 09/15/2018 1602) Patient will transfer sit to/from stand: with supervision Goal: Pt Will Transfer Bed To Chair/Chair To Bed Outcome: Progressing Flowsheets (Taken 09/15/2018 1602) Pt will Transfer Bed to Chair/Chair to Bed: with supervision Goal: Pt Will Ambulate Outcome: Progressing Flowsheets (Taken 09/15/2018 1602) Pt will Ambulate: 75 feet; with supervision; with rolling walker   4:03 PM, 09/15/18 Ocie BobJames Nekoda Chock, MPT Physical Therapist with [facility] 336 [phone] office [phone] mobile phone

## 2018-09-15 NOTE — Progress Notes (Signed)
Patient has her 82th birthday today in fair spirits she has agreed to go to a skilled nursing facility rehab stay for up to 3 weeks for strengthening and control of her respiratory and urinary tract status I will get physical therapy for strengthening and ambulation and as a precursor to possible rehab placement she wishes to remain in [name] [name] [id number] DOB: 03-18-1936 DOA: 09/13/2018 PCP: Oval Linseyondiego, Salathiel Ferrara, MD   Physical Exam: Blood pressure (!) 116/59, pulse 75, temperature 98.4 F (36.9 C), temperature source Oral, resp. rate 18, height 5' 4.5" (1.638 m), weight 87.7 kg, SpO2 93 %.  Lungs show prolonged expiratory phase mild end expiratory wheeze scattered rhonchi no rales heart regular rhythm no S3-S4 no heaves thrills rubs abdomen soft nontender bowel sounds normoactive   Investigations:  No results found for this or any previous visit (from the past 240 hour(s)).   Basic Metabolic Panel: Recent Labs    09/13/18 0026 09/13/18 1155 09/14/18 0426  NA 141  --  138  K 3.8  --  5.1  CL 104  --  103  CO2 31  --  25  GLUCOSE 75 438* 286*  BUN 10  --  23  CREATININE 0.75  --  0.82  CALCIUM 10.0  --  9.8   Liver Function Tests: Recent Labs    09/14/18 0426  AST 13*  ALT 11  ALKPHOS 76  BILITOT 0.6  PROT 7.0  ALBUMIN 3.7     CBC: Recent Labs    09/13/18 0026 09/14/18 0426  WBC 12.7* 16.4*  NEUTROABS 6.0  --   HGB 14.2 12.5  HCT 45.6 40.9  MCV 82.8 82.6  PLT 318 331    No results found.    Medications:   Impression:  Principal Problem:   COPD exacerbation (HCC) Active Problems:   Hypothyroidism   DM type 2 causing complication (HCC)   Essential hypertension   Sleep apnea   Noncompliance with CPAP treatment   OSA (obstructive sleep apnea)     Plan: Physical therapy for strengthening and ambulation DC IV Solu-Medrol prednisone 40 mg/day observe respiratory status on oral steroids and consider for rehab stay which she now agrees  to provide it is not reasonable.  We will add Septra DS twice daily for UTI which is chronic and recurrent sensitive to this  Consultants:    Procedures   Antibiotics: Septra DS twice daily       Time spent: 45 minutes   LOS: 1 day   Kabeer Hoagland M   09/15/2018, 1:32 PM

## 2018-09-15 NOTE — Plan of Care (Signed)
  Problem: Education: Goal: Knowledge of General Education information will improve Description Including pain rating scale, medication(s)/side effects and non-pharmacologic comfort measures 09/15/2018 1750 by Cordie Griceodriguez, Pricsilla Lindvall A, RN Outcome: Progressing 09/15/2018 1749 by Cordie Griceodriguez, Johnthomas Lader A, RN Outcome: Progressing   Problem: Health Behavior/Discharge Planning: Goal: Ability to manage health-related needs will improve 09/15/2018 1750 by Cordie Griceodriguez, Avarey Yaeger A, RN Outcome: Progressing 09/15/2018 1749 by Cordie Griceodriguez, Nechemia Chiappetta A, RN Outcome: Progressing   Problem: Clinical Measurements: Goal: Ability to maintain clinical measurements within normal limits will improve 09/15/2018 1750 by Cordie Griceodriguez, Keiarra Charon A, RN Outcome: Progressing 09/15/2018 1749 by Cordie Griceodriguez, Taylorann Tkach A, RN Outcome: Progressing Goal: Will remain free from infection 09/15/2018 1750 by Cordie Griceodriguez, Demarr Kluever A, RN Outcome: Progressing 09/15/2018 1749 by Cordie Griceodriguez, Alvaretta Eisenberger A, RN Outcome: Progressing Goal: Diagnostic test results will improve 09/15/2018 1750 by Cordie Griceodriguez, Ameerah Huffstetler A, RN Outcome: Progressing 09/15/2018 1749 by Cordie Griceodriguez, Audy Dauphine A, RN Outcome: Progressing Goal: Respiratory complications will improve 09/15/2018 1750 by Cordie Griceodriguez, Louie Flenner A, RN Outcome: Progressing 09/15/2018 1749 by Cordie Griceodriguez, Katriona Schmierer A, RN Outcome: Progressing Goal: Cardiovascular complication will be avoided 09/15/2018 1750 by Cordie Griceodriguez, Revia Nghiem A, RN Outcome: Progressing 09/15/2018 1749 by Cordie Griceodriguez, Dhanush Jokerst A, RN Outcome: Progressing   Problem: Activity: Goal: Risk for activity intolerance will decrease 09/15/2018 1750 by Cordie Griceodriguez, Johann Santone A, RN Outcome: Progressing 09/15/2018 1749 by Cordie Griceodriguez, Elyssa Pendelton A, RN Outcome: Progressing   Problem: Nutrition: Goal: Adequate nutrition will be maintained 09/15/2018 1750 by Cordie Griceodriguez, Michah Minton A, RN Outcome: Progressing 09/15/2018 1749 by Cordie Griceodriguez, Gentry Seeber A, RN Outcome: Progressing   Problem: Coping: Goal: Level of anxiety will  decrease 09/15/2018 1750 by Cordie Griceodriguez, Mansel Strother A, RN Outcome: Progressing 09/15/2018 1749 by Cordie Griceodriguez, Tymarion Everard A, RN Outcome: Progressing   Problem: Elimination: Goal: Will not experience complications related to bowel motility 09/15/2018 1750 by Cordie Griceodriguez, Elzena Muston A, RN Outcome: Progressing 09/15/2018 1749 by Cordie Griceodriguez, Shakila Mak A, RN Outcome: Progressing Goal: Will not experience complications related to urinary retention 09/15/2018 1750 by Cordie Griceodriguez, Kailo Kosik A, RN Outcome: Progressing 09/15/2018 1749 by Cordie Griceodriguez, Keita [place] A, RN Outcome: Progressing   Problem: Pain Managment: Goal: General experience of comfort will improve 09/15/2018 1750 by Cordie Griceodriguez, Dekota Shenk A, RN Outcome: Progressing 09/15/2018 1749 by Cordie Griceodriguez, Tor Tsuda A, RN Outcome: Progressing   Problem: Safety: Goal: Ability to remain free from injury will improve 09/15/2018 1750 by Cordie Griceodriguez, Jalecia Leon A, RN Outcome: Progressing 09/15/2018 1749 by Cordie Griceodriguez, Takya Vandivier A, RN Outcome: Progressing   Problem: Skin Integrity: Goal: Risk for impaired skin integrity will decrease 09/15/2018 1750 by Cordie Griceodriguez, Johan Creveling A, RN Outcome: Progressing 09/15/2018 1749 by Cordie Griceodriguez, Ryelee Albee A, RN Outcome: Progressing   Problem: Education: Goal: Knowledge of disease or condition will improve Outcome: Progressing Goal: Knowledge of the prescribed therapeutic regimen will improve Outcome: Progressing Goal: Individualized Educational Video(s) Outcome: Progressing   Problem: Activity: Goal: Ability to tolerate increased activity will improve Outcome: Progressing Goal: Will verbalize the importance of balancing activity with adequate rest periods Outcome: Progressing   Problem: Respiratory: Goal: Ability to maintain a clear airway will improve Outcome: Progressing Goal: Levels of oxygenation will improve Outcome: Progressing Goal: Ability to maintain adequate ventilation will improve Outcome: Progressing

## 2018-09-15 NOTE — Evaluation (Signed)
Physical Therapy Evaluation Patient Details Name: [name] [name] MRN: [id number] DOB: 07-30-1936 Today's Date: 09/15/2018   History of Present Illness  [name] [name]  is a 82 y.o. female, with history of CVA, hypertension, hyperlipidemia, hypothyroidism, diabetes mellitus type 2 came to hospital with worsening shortness of breath.  Patient also has been coughing up for past 3 days.  Also coughing up productive phlegm.  She complains of chest pain due to coughing.  Denies nausea vomiting or diarrhea.    Clinical Impression  Patient demonstrates slightly labored movement for sitting up at bedside and during sit to stands, transfers and ambulation in hallway.  Patient limited for gait training due to mild SOB and c/o fatigue.  Patient tolerated sitting up in chair after therapy.  Patient will benefit from continued physical therapy in hospital and recommended venue below to increase strength, balance, endurance for safe ADLs and gait.    Follow Up Recommendations SNF;Supervision/Assistance - 24 hour    Equipment Recommendations  Rolling walker with 5" wheels    Recommendations for Other Services       Precautions / Restrictions Precautions Precautions: Fall Restrictions Weight Bearing Restrictions: No      Mobility  Bed Mobility Overal bed mobility: Needs Assistance Bed Mobility: Supine to Sit     Supine to sit: Supervision     General bed mobility comments: HOB slightly elevated and use of bed rail   Transfers Overall transfer level: Needs assistance Equipment used: Rolling walker (2 wheeled) Transfers: Sit to/from [employer] Transfers Sit to Stand: Min guard            Ambulation/Gait Ambulation/Gait assistance: Editor, commissioning (Feet): 40 Feet Assistive device: Rolling walker (2 wheeled) Gait Pattern/deviations: Decreased step length - right;Decreased step length - left;Decreased stride length Gait velocity: reduced   General Gait Details:  slow labored cadence without loss of balance, limited most due to fatigue and mild SOB  Stairs            Wheelchair Mobility    Modified Rankin (Stroke Patients Only)       Balance Overall balance assessment: Needs assistance Sitting-balance support: Feet supported;No upper extremity supported Sitting balance-Leahy Scale: Good     Standing balance support: During functional activity;Bilateral upper extremity supported Standing balance-Leahy Scale: Fair Standing balance comment: fair using RW                             Pertinent Vitals/Pain Pain Assessment: 0-10 Pain Score: 6  Pain Location: chronic low back Pain Descriptors / Indicators: Discomfort;Sore Pain Intervention(s): Limited activity within patient's tolerance;Monitored during session    Home Living Family/patient expects to be discharged to:: Private residence Living Arrangements: Spouse/significant other;Children Available Help at Discharge: Family;Available PRN/intermittently Type of Home: House Home Access: Stairs to enter Entrance Stairs-Rails: Right;Left;Can reach both Entrance Stairs-Number of Steps: 2 Home Layout: One level Home Equipment: Shower seat;Cane - single point Additional Comments: patient states she is not sure if still has walker at home    Prior Function Level of Independence: Independent with assistive device(s)         Comments: community ambulator with [employer]PC PRN, drives     Hand Dominance   Dominant Hand: Right    Extremity/Trunk Assessment   Upper Extremity Assessment Upper Extremity Assessment: Generalized weakness    Lower Extremity Assessment Lower Extremity Assessment: Generalized weakness    Cervical / Trunk Assessment Cervical / Trunk Assessment: Normal  Communication   Communication: No difficulties  Cognition Arousal/Alertness: Awake/alert Behavior During Therapy: WFL for tasks assessed/performed Overall Cognitive Status: Within Functional  Limits for tasks assessed                                        General Comments      Exercises     Assessment/Plan    PT Assessment Patient needs continued PT services  PT Problem List Decreased strength;Decreased activity tolerance;Decreased balance;Decreased mobility       PT Treatment Interventions Gait training;Stair training;Functional mobility training;Therapeutic activities;Therapeutic exercise;Patient/family education    PT Goals (Current goals can be found in the Care Plan section)  Acute Rehab PT Goals Patient Stated Goal: return home PT Goal Formulation: With patient Time For Goal Achievement: 09/29/18    Frequency Min 3X/week   Barriers to discharge        Co-evaluation               AM-PAC PT "6 Clicks" Daily Activity  Outcome Measure Difficulty turning over in bed (including adjusting bedclothes, sheets and blankets)?: A Little Difficulty moving from lying on back to sitting on the side of the bed? : A Little Difficulty sitting down on and standing up from a chair with arms (e.g., wheelchair, bedside commode, etc,.)?: A Little Help needed moving to and from a bed to chair (including a wheelchair)?: A Little Help needed walking in hospital room?: A Little Help needed climbing 3-5 steps with a railing? : A Lot 6 Click Score: 17    End of Session   Activity Tolerance: Patient tolerated treatment well;Patient limited by fatigue Patient left: in chair;with call bell/phone within reach Nurse Communication: Mobility status PT Visit Diagnosis: Unsteadiness on feet (R26.81);Other abnormalities of gait and mobility (R26.89);Muscle weakness (generalized) (M62.81)    Time: [id number] PT Time Calculation (min) (ACUTE ONLY): 25 min   Charges:   PT Evaluation $PT Eval Moderate Complexity: 1 Mod PT Treatments $Therapeutic Activity: 23-37 mins        4:00 PM, 09/15/18 Ocie BobJames Wilfred Dayrit, MPT Physical Therapist with [phone] office [phone] mobile phone

## 2018-09-15 NOTE — Plan of Care (Signed)

## 2018-09-16 LAB — GLUCOSE, CAPILLARY
GLUCOSE-CAPILLARY: 169 mg/dL — AB (ref 70–99)
Glucose-Capillary: 138 mg/dL — ABNORMAL HIGH (ref 70–99)
Glucose-Capillary: 315 mg/dL — ABNORMAL HIGH (ref 70–99)
Glucose-Capillary: 278 mg/dL — ABNORMAL HIGH (ref 70–99)

## 2018-09-16 MED ORDER — CEPHALEXIN 500 MG PO CAPS
500.0000 mg | ORAL_CAPSULE | Freq: Two times a day (BID) | ORAL | Status: DC
  Administered 2018-09-16 – 2018-09-20 (×8): 500 mg via ORAL
  Filled 2018-09-16 (×8): qty 1

## 2018-09-16 MED ORDER — DM-GUAIFENESIN ER 30-600 MG PO TB12
1.0000 | ORAL_TABLET | Freq: Two times a day (BID) | ORAL | Status: DC
  Administered 2018-09-16 – 2018-09-20 (×9): 1 via ORAL
  Filled 2018-09-16 (×9): qty 1

## 2018-09-16 MED ORDER — IPRATROPIUM-ALBUTEROL 0.5-2.5 (3) MG/3ML IN SOLN
3.0000 mL | Freq: Four times a day (QID) | RESPIRATORY_TRACT | Status: DC
  Administered 2018-09-17 – 2018-09-20 (×10): 3 mL via RESPIRATORY_TRACT
  Filled 2018-09-16 (×10): qty 3

## 2018-09-16 NOTE — [place] FL2 (Signed)
[place] MEDICAID FL2 LEVEL OF CARE SCREENING TOOL     IDENTIFICATION  Patient Name: [name] [name] Birthdate: 1936-07-05 Sex: female Admission Date (Current Location): 09/13/2018  [facility] and [place] Number:  [employer] and Address:  [facility],  [address], [place] [address]      Provider Number: [id number]  Attending Physician Name and Address:  Oval Linsey, MD  Relative Name and Phone Number:       Current Level of Care: Hospital Recommended Level of Care: Skilled Nursing Facility Prior Approval Number:    Date Approved/Denied:   PASRR Number: [id number]  Discharge Plan: SNF    Current Diagnoses: Patient Active Problem List   Diagnosis Date Noted  . Syncope   . Loss of consciousness (HCC) 06/10/2018  . Hyperglycemia 06/10/2018  . Bradycardia, sinus 06/10/2018  . Diastolic dysfunction 09/29/2017  . CAD (coronary artery disease) 09/28/2017  . Acute lower UTI 09/28/2017  . Ischemic stroke (HCC) 05/10/2017  . Acute ischemic stroke (HCC) 05/10/2017  . Stroke (HCC) 08/01/2016  . Dysarthria 03/18/2016  . Anemia of chronic disease 11/19/2015  . OSA (obstructive sleep apnea) 11/19/2015  . Left facial numbness 08/29/2015  . Gait instability 06/26/2015  . COPD exacerbation (HCC) 11/13/2014  . Weakness generalized 12/10/2013  . TIA (transient ischemic attack) 06/22/2012  . Left-sided headache 06/22/2012  . Noncompliance 06/22/2012  . Noncompliance with CPAP treatment 06/22/2012  . Psoriasis 01/07/2012  . Cerebrovascular disease   . Chronic back pain   . Chronic obstructive pulmonary disease (HCC)   . DM type 2 causing complication (HCC) 02/14/2010  . Obesity 02/14/2010  . Arteriosclerotic cardiovascular disease (ASCVD) 02/14/2010  . Hypothyroidism 02/06/2010  . Hyperlipidemia 02/06/2010  . Essential hypertension 02/06/2010  . Sleep apnea 02/06/2010    Orientation RESPIRATION BLADDER Height & Weight        O2(see dc  summary) Incontinent Weight: 194 lb 14.4 oz (88.4 kg) Height:  5' 4.5" (163.8 cm)  BEHAVIORAL SYMPTOMS/MOOD NEUROLOGICAL BOWEL NUTRITION STATUS      Continent Diet(see dc summary)  AMBULATORY STATUS COMMUNICATION OF NEEDS Skin   Extensive Assist Verbally Normal                       Personal Care Assistance Level of Assistance    Bathing Assistance: Limited assistance Feeding assistance: Independent Dressing Assistance: Limited assistance     Functional Limitations Info    Sight Info: Adequate Hearing Info: Adequate Speech Info: Adequate    SPECIAL CARE FACTORS FREQUENCY  PT (By licensed PT)     PT Frequency: 5x week              Contractures Contractures Info: Not present    Additional Factors Info    Code Status Info: Full Allergies Info: Codeine           Current Medications (09/16/2018):  This is the current hospital active medication list Current Facility-Administered Medications  Medication Dose Route Frequency Provider Last Rate Last Dose  . 0.9 %  sodium chloride infusion  250 mL Intravenous PRN Meredeth Ide, MD      . amLODipine (NORVASC) tablet 5 mg  5 mg Oral Daily Meredeth Ide, MD   5 mg at 09/16/18 0843  . aspirin EC tablet 325 mg  325 mg Oral Daily Meredeth Ide, MD   325 mg at 09/16/18 0843  . bisacodyl (DULCOLAX) EC tablet 10 mg  10 mg Oral Daily PRN Schorr,  Roma Kayser, NP   10 mg at 09/15/18 2258  . docusate sodium (COLACE) capsule 100 mg  100 mg Oral Daily Schorr, Roma Kayser, NP   100 mg at 09/16/18 0843  . enoxaparin (LOVENOX) injection 40 mg  40 mg Subcutaneous Q24H Meredeth Ide, MD   40 mg at 09/16/18 0842  . gabapentin (NEURONTIN) capsule 300 mg  300 mg Oral BID Meredeth Ide, MD   300 mg at 09/16/18 0843  . guaiFENesin (MUCINEX) 12 hr tablet 600 mg  600 mg Oral BID Mariea Clonts, Courage, MD   600 mg at 09/16/18 0843  . guaiFENesin-dextromethorphan (ROBITUSSIN DM) 100-10 MG/5ML syrup 15 mL  15 mL Oral Q6H PRN Oval Linsey, MD   15  mL at 09/15/18 1953  . hydrALAZINE (APRESOLINE) injection 10 mg  10 mg Intravenous Q6H PRN Emokpae, Courage, MD      . insulin aspart (novoLOG) injection 0-20 Units  0-20 Units Subcutaneous TID WC Oval Linsey, MD   3 Units at 09/16/18 0841  . insulin aspart (novoLOG) injection 0-5 Units  0-5 Units Subcutaneous QHS Oval Linsey, MD   2 Units at 09/15/18 2259  . insulin aspart protamine- aspart (NOVOLOG MIX 70/30) injection 40 Units  40 Units Subcutaneous BID WC Meredeth Ide, MD   40 Units at 09/16/18 [phone]  . insulin glargine (LANTUS) injection 20 Units  20 Units Subcutaneous QHS Oval Linsey, MD   20 Units at 09/15/18 2259  . ipratropium-albuterol (DUONEB) 0.5-2.5 (3) MG/3ML nebulizer solution 2.5 mg  2.5 mg Nebulization Q6H WA Oval Linsey, MD   2.5 mg at 09/16/18 0802  . levothyroxine (SYNTHROID, LEVOTHROID) tablet 25 mcg  25 mcg Oral QAC breakfast Meredeth Ide, MD   25 mcg at 09/16/18 0844  . lisinopril (PRINIVIL,ZESTRIL) tablet 10 mg  10 mg Oral Daily Meredeth Ide, MD   10 mg at 09/16/18 0843  . LORazepam (ATIVAN) tablet 1 mg  1 mg Oral QHS Oval Linsey, MD   1 mg at 09/15/18 2259  . MEDLINE mouth rinse  15 mL Mouth Rinse BID Oval Linsey, MD   15 mL at 09/16/18 0844  . ondansetron (ZOFRAN) tablet 4 mg  4 mg Oral Q6H PRN Meredeth Ide, MD       Or  . ondansetron (ZOFRAN) injection 4 mg  4 mg Intravenous Q6H PRN Meredeth Ide, MD      . oxyCODONE (Oxy IR/ROXICODONE) immediate release tablet 5 mg  5 mg Oral Q4H PRN Meredeth Ide, MD   5 mg at 09/16/18 0848  . phenol (CHLORASEPTIC) mouth spray 1 spray  1 spray Mouth/Throat PRN Oval Linsey, MD   1 spray at 09/15/18 0907  . predniSONE (DELTASONE) tablet 20 mg  20 mg Oral Q breakfast Oval Linsey, MD   20 mg at 09/16/18 0843  . sodium chloride flush (NS) 0.9 % injection 3 mL  3 mL Intravenous Q12H Meredeth Ide, MD   3 mL at 09/16/18 0844  . sodium chloride flush (NS) 0.9 % injection 3 mL  3 mL Intravenous  PRN Meredeth Ide, MD   3 mL at 09/15/18 0911  . sulfamethoxazole-trimethoprim (BACTRIM DS,SEPTRA DS) 800-160 MG per tablet 1 tablet  1 tablet Oral Q12H Oval Linsey, MD   1 tablet at 09/16/18 0272     Discharge Medications: Please see discharge summary for a list of discharge medications.  Relevant Imaging Results:  Relevant Lab Results:   Additional Information SSN: [id number]  Shade Flood, LCSW

## 2018-09-16 NOTE — Progress Notes (Addendum)
Inpatient Diabetes Program Recommendations  AACE/ADA: New Consensus Statement on Inpatient Glycemic Control (2015)  Target Ranges:  Prepandial:   less than 140 mg/dL      Peak postprandial:   less than 180 mg/dL (1-2 hours)      Critically ill patients:  140 - 180 mg/dL   Results for [name] [name], [name] [name] (MRN [id number]) as of 09/16/2018 13:55  Ref. Range 09/15/2018 07:27 09/15/2018 11:13 09/15/2018 16:32 09/15/2018 21:13  Glucose-Capillary Latest Ref Range: 70 - 99 mg/dL 409 (H) 811 (H) 914 (H) 201 (H)   Results for [name] [name], [name] [name] (MRN [id number]) as of 09/17/2018 08:16  Ref. Range 09/16/2018 07:41 09/16/2018 11:16 09/16/2018 16:24 09/16/2018 21:20  Glucose-Capillary Latest Ref Range: 70 - 99 mg/dL 086 (H) 578 (H) 469 (H) 278 (H)   Results for [name] [name], [name] [name] (MRN [id number]) as of 09/17/2018 08:16  Ref. Range 09/17/2018 07:40  Glucose-Capillary Latest Ref Range: 70 - 99 mg/dL 63 (L)     Home DM Meds: 70/30 Insulin 40 units BID       Metformin 500 mg BID   Current Orders: Lantus 20 QHS       70/30 Insulin 40 units BID      Novolog Resistant Correction Scale/ SSI (0-20 units) TID AC + HS         Last dose of Solumedrol given 6am on 09/24 and now getting Prednisone 20 mg daily.  CBG rose to 315 mg/dl pre-lunch yesterday.  Now with Hypoglycemia this AM.     MD- Please consider the following in-hospital insulin adjustments:  1. Stop Lantus 20 units QHS  2. Adjust 70/30 Insulin as needed based on CBGs--Consider increasing 70/30 Insulin to 45 units BID      --Will follow patient during hospitalization--  Ambrose Finland RN, MSN, CDE Diabetes Coordinator Inpatient Glycemic Control Team Team Pager: [phone] (8a-5p)

## 2018-09-16 NOTE — Clinical Social Work Note (Signed)
Clinical Social Work Assessment  Patient Details  Name: [name] [name] MRN: [id number] Date of Birth: Apr 12, 1936  Date of referral:  09/16/18               Reason for consult:  Facility Placement                Permission sought to share information with:    Permission granted to share information::     Name::        Agency::     Relationship::     Contact Information:     Housing/Transportation Living arrangements for the past 2 months:  Single Family Home Source of Information:  Patient Patient Interpreter Needed:  None Criminal Activity/Legal Involvement Pertinent to Current Situation/Hospitalization:  No - Comment as needed Significant Relationships:  Spouse Lives with:  Spouse Do you feel safe going back to the place where you live?  Yes Need for family participation in patient care:  Yes (Comment)  Care giving concerns:  None identified at baseline.    Social Worker assessment / plan:  Patient states that she is agreeable to SNF for short term rehab this time and she will go.   Employment status:  Retired Database administrator PT Recommendations:  Skilled Nursing Facility Information / Referral to community resources:  Skilled Nursing Facility  Patient/Family's Response to care:  Patient is agreeable to SNF for rehab.   Patient/Family's Understanding of and Emotional Response to Diagnosis, Current Treatment, and Prognosis:  Patient understands her diagnosis, treatment and prognosis.   Emotional Assessment Appearance:  Appears stated age Attitude/Demeanor/Rapport:    Affect (typically observed):  Accepting, Calm Orientation:  Oriented to Self, Oriented to Place, Oriented to  Time, Oriented to Situation Alcohol / Substance use:  Not Applicable Psych involvement (Current and /or in the community):     Discharge Needs  Concerns to be addressed:  Discharge Planning Concerns Readmission within the last 30 days:  Yes Current discharge risk:   None Barriers to Discharge:  Insurance Authorization   Annice Needy, LCSW 09/16/2018, 10:51 AM

## 2018-09-16 NOTE — Progress Notes (Signed)
Appreciate case manager and social services expertise in evaluating possible rehab stay patient has mild to moderate wheezing expiratory only today scattered rhonchi no rales appreciable breathing is holding on oral prednisone at present.  Physical therapy has begun.  Cipro discontinued due to hyperkalemia as per pharmacy recommendation check be met in a.m. [name] [name] MRN:[id number] DOB: 02/03/1936 DOA: 09/13/2018 PCP: [name], Richard, MD   Physical Exam: Blood pressure 135/74, pulse 68, temperature 97.9 F (36.6 C), temperature source Oral, resp. rate 19, height 5' 4.5" (1.638 m), weight 88.4 kg, SpO2 96 %.  Lungs scattered rhonchi mild expiratory wheeze bilaterally no rales audible heart regular rhythm no S3-S4 no heaves thrills or rubs abdomen soft nontender bowel sounds normoactive no guarding or rebound no masses no megaly   Investigations:  No results found for this or any previous visit (from the past 240 hour(s)).   Basic Metabolic Panel: Recent Labs    09/14/18 0426  NA 138  K 5.1  CL 103  CO2 25  GLUCOSE 286*  BUN 23  CREATININE 0.82  CALCIUM 9.8   Liver Function Tests: Recent Labs    09/14/18 0426  AST 13*  ALT 11  ALKPHOS 76  BILITOT 0.6  PROT 7.0  ALBUMIN 3.7     CBC: Recent Labs    09/14/18 0426  WBC 16.4*  HGB 12.5  HCT 40.9  MCV 82.6  PLT 331    No results found.    Medications:   Impression:  Principal Problem:   COPD exacerbation (HCC) Active Problems:   Hypothyroidism   DM type 2 causing complication (HCC)   Essential hypertension   Sleep apnea   Noncompliance with CPAP treatment   OSA (obstructive sleep apnea)     Plan: Continue prednisone 40 mg p.o. daily monitor glycemic control and respiratory response and physical therapy.  Be met in a.m.  Consultants:     Procedures   Antibiotics: Keflex 500 p.o. 4 times daily for UTI Septra DC'd due to hyperkalemia           Time spent: 30 minutes   LOS: 2 days    [name],[name]   09/16/2018, 12:37 PM    

## 2018-09-17 LAB — GLUCOSE, CAPILLARY
GLUCOSE-CAPILLARY: 80 mg/dL (ref 70–99)
GLUCOSE-CAPILLARY: 315 mg/dL — AB (ref 70–99)
Glucose-Capillary: 63 mg/dL — ABNORMAL LOW (ref 70–99)
Glucose-Capillary: 187 mg/dL — ABNORMAL HIGH (ref 70–99)
Glucose-Capillary: 368 mg/dL — ABNORMAL HIGH (ref 70–99)

## 2018-09-17 LAB — BASIC METABOLIC PANEL
ANION GAP: 6 (ref 5–15)
BUN: 26 mg/dL — AB (ref 8–23)
CALCIUM: 8.5 mg/dL — AB (ref 8.9–10.3)
CO2: 28 mmol/L (ref 22–32)
Chloride: 105 mmol/L (ref 98–111)
Creatinine, Ser: 0.93 mg/dL (ref 0.44–1.00)
GFR calc Af Amer: >60 mL/min (ref >60)
GFR, EST NON AFRICAN AMERICAN: 57 mL/min — AB (ref >60)
GLUCOSE: 68 mg/dL — AB (ref 70–99)
POTASSIUM: 4.1 mmol/L (ref 3.5–5.1)
Sodium: 139 mmol/L (ref 135–145)

## 2018-09-17 NOTE — Progress Notes (Signed)
Patient refuses bed/chair alarm.  Patient alert and oriented.  Patient educated on being high fall risk.  Yellow skid socks in place.  Call bell within reach.

## 2018-09-17 NOTE — Progress Notes (Addendum)
Patient has moderate increased wheezing today as compared to yesterday we will increase prednisone from 40 mg to 60 mg p.o. daily currently on Mucinex DM twice daily as well as Robitussin-DM every 6 hours for cough we will continue with DuoNeb nebulizers 4 times daily [name] [name] [id number] DOB: 21-Dec-1936 DOA: 09/13/2018 PCP: Oval Linsey, MD   Physical Exam: Blood pressure 130/77, pulse 69, temperature 98.7 F (37.1 C), temperature source Oral, resp. rate 17, height 5' 4.5" (1.638 m), weight 88.5 kg, SpO2 95 %.  Moderate expiratory wheeze scattered rhonchi bilaterally no rales audible heart regular rhythm no S3-S4 no heaves thrills rubs abdomen soft nontender bowel sounds normoactive no guarding no rebound   Investigations:  No results found for this or any previous visit (from the past 240 hour(s)).   Basic Metabolic Panel: Recent Labs    09/17/18 0513  NA 139  K 4.1  CL 105  CO2 28  GLUCOSE 68*  BUN 26*  CREATININE 0.93  CALCIUM 8.5*   Liver Function Tests: No results for input(s): AST, ALT, ALKPHOS, BILITOT, PROT, ALBUMIN in the last 72 hours.   CBC: No results for input(s): WBC, NEUTROABS, HGB, HCT, MCV, PLT in the last 72 hours.  No results found.    Medications:  Impression:  Principal Problem:   COPD exacerbation (HCC) Active Problems:   Hypothyroidism   DM type 2 causing complication (HCC)   Essential hypertension   Sleep apnea   Noncompliance with CPAP treatment   OSA (obstructive sleep apnea)     Plan: Increase prednisone 20 mg tab from 2 to 3 tablets a day that is 60 mg daily out of bed to chair 3 times daily Robitussin DM and Mucinex DM simultaneously.  Discontinue Lantus 20 units at bedtime  Consultants:    Procedures   Antibiotics:          Time spent: 30 minutes   LOS: 3 days   Mckale Haffey M   09/17/2018, 12:44 PM

## 2018-09-17 NOTE — Progress Notes (Signed)
Physical Therapy Treatment Patient Details Name: ANI DEOLIVEIRA MRN: [id number] DOB: Nov 18, 1936 Today's Date: 09/17/2018    History of Present Illness Teneil Shiller  is a 82 y.o. female, with history of CVA, hypertension, hyperlipidemia, hypothyroidism, diabetes mellitus type 2 came to hospital with worsening shortness of breath.  Patient also has been coughing up for past 3 days.  Also coughing up productive phlegm.  She complains of chest pain due to coughing.  Denies nausea vomiting or diarrhea.    PT Comments    Pt received up to chair, grandson in room, SpO2 86% on room air. 3L donned for AMB 14ft, then 2L for a second 161ft after resting. Pt performs seated therex and transfers training on 1L/min, recovering <30sec to 89-90% SpO2. Pt continues to feel weak, but improved tolerance to general mobility compared to 2 days ago. Gait speed remains indicative of falls risk and indicated primarily household ambulation.   Follow Up Recommendations  SNF;Supervision/Assistance - 24 hour     Equipment Recommendations  Rolling walker with 5" wheels    Recommendations for Other Services       Precautions / Restrictions Precautions Precautions: Fall Precaution Comments: watch O2 sats Restrictions Weight Bearing Restrictions: No    Mobility  Bed Mobility               General bed mobility comments: received up in chair  Transfers Overall transfer level: Modified independent Equipment used: None Transfers: Sit to/from Stand Sit to Stand: Supervision         General transfer comment: performs 2 sets of 5x for strengthening and dynamic balance training; high level effort, takes >25 seconds each set  Ambulation/Gait Ambulation/Gait assistance: Supervision;Min guard Gait Distance (Feet): 120 Feet(2x116ft s assistiv edevice; some shuffling and 2 LOB self corrected) Assistive device: None   Gait velocity: 0.80m/s  Gait velocity interpretation: <1.8 ft/sec, indicate of risk  for recurrent falls General Gait Details: in room, 1x120ft on 3L (93% SpO2) then 1x161ft on 2L (91% SpO2)    Stairs             Wheelchair Mobility    Modified Rankin (Stroke Patients Only)       Balance Overall balance assessment: Mild deficits observed, not formally tested;Needs assistance;History of Falls         Standing balance support: During functional activity(2 LOB with AMB in room)                                Cognition Arousal/Alertness: Awake/alert Behavior During Therapy: WFL for tasks assessed/performed Overall Cognitive Status: Within Functional Limits for tasks assessed                                        Exercises Other Exercises Other Exercises: 2x5 reps sit to stand from chair  Other Exercises: 1x10 marching seated bilat Other Exercises: 1x10 LAQ seated bilat     General Comments        Pertinent Vitals/Pain Pain Assessment: Faces Faces Pain Scale: Hurts a little bit Pain Location: does not rate: low back pain (chronic) and HA  Pain Intervention(s): Limited activity within patient's tolerance;Monitored during session    Home Living                      Prior Function  PT Goals (current goals can now be found in the care plan section) Acute Rehab PT Goals Patient Stated Goal: return home PT Goal Formulation: With patient Time For Goal Achievement: 09/29/18 Potential to Achieve Goals: Good Progress towards PT goals: Progressing toward goals    Frequency    Min 3X/week      PT Plan Current plan remains appropriate    Co-evaluation              AM-PAC PT "6 Clicks" Daily Activity  Outcome Measure  Difficulty turning over in bed (including adjusting bedclothes, sheets and blankets)?: A Little Difficulty moving from lying on back to sitting on the side of the bed? : A Little Difficulty sitting down on and standing up from a chair with arms (e.g., wheelchair,  bedside commode, etc,.)?: A Little Help needed moving to and from a bed to chair (including a wheelchair)?: A Little Help needed walking in hospital room?: A Little Help needed climbing 3-5 steps with a railing? : A Lot 6 Click Score: 17    End of Session Equipment Utilized During Treatment: Gait belt;Oxygen Activity Tolerance: Patient tolerated treatment well;Patient limited by fatigue Patient left: in chair;with call bell/phone within reach;with family/visitor present Nurse Communication: Mobility status(O2 needs) PT Visit Diagnosis: Unsteadiness on feet (R26.81);Other abnormalities of gait and mobility (R26.89);Muscle weakness (generalized) (M62.81)     Time: 9528-4132 PT Time Calculation (min) (ACUTE ONLY): 23 min  Charges:  $Therapeutic Exercise: 8-22 mins $Therapeutic Activity: 8-22 mins                     1:51 PM, 09/17/18 Rosamaria Lints, PT, DPT Physical Therapist - [place] [phone] (Office)    Payne Garske C 09/17/2018, 1:49 PM

## 2018-09-18 ENCOUNTER — Inpatient Hospital Stay (HOSPITAL_COMMUNITY): Payer: Medicare HMO

## 2018-09-18 LAB — BASIC METABOLIC PANEL
ANION GAP: 5 (ref 5–15)
BUN: 23 mg/dL (ref 8–23)
CALCIUM: 8.3 mg/dL — AB (ref 8.9–10.3)
CO2: 30 mmol/L (ref 22–32)
Chloride: 101 mmol/L (ref 98–111)
Creatinine, Ser: 0.78 mg/dL (ref 0.44–1.00)
GFR calc Af Amer: >60 mL/min (ref >60)
GFR calc non Af Amer: >60 mL/min (ref >60)
GLUCOSE: 206 mg/dL — AB (ref 70–99)
Potassium: 4.3 mmol/L (ref 3.5–5.1)
SODIUM: 136 mmol/L (ref 135–145)

## 2018-09-18 LAB — GLUCOSE, CAPILLARY
GLUCOSE-CAPILLARY: 327 mg/dL — AB (ref 70–99)
Glucose-Capillary: 159 mg/dL — ABNORMAL HIGH (ref 70–99)
Glucose-Capillary: 209 mg/dL — ABNORMAL HIGH (ref 70–99)
Glucose-Capillary: 228 mg/dL — ABNORMAL HIGH (ref 70–99)

## 2018-09-18 MED ORDER — CITALOPRAM HYDROBROMIDE 20 MG PO TABS
20.0000 mg | ORAL_TABLET | Freq: Every day | ORAL | Status: DC
  Administered 2018-09-18 – 2018-09-20 (×3): 20 mg via ORAL
  Filled 2018-09-18 (×3): qty 1

## 2018-09-18 NOTE — Care Management (Addendum)
CM further discussed DC plan with patient who is upset she was not seen by [facility] PT after DC last admission d/t staffing issues. She requests another [facility] provider be given referral. CM has made [facility] HH rep, Clydie Braun, aware. [facility] rep, Bonita Quin, aware.

## 2018-09-18 NOTE — Care Management Important Message (Signed)
Important Message  Patient Details  Name: [name] [name] MRN: [id number] Date of Birth: December 21, 1936   Medicare Important Message Given:  Yes    Malcolm Metro, RN 09/18/2018, 11:05 AM

## 2018-09-18 NOTE — Progress Notes (Signed)
Physical Therapy Treatment Patient Details Name: [name] [name] MRN: [id number] DOB: December 27, 1935 Today's Date: 09/18/2018    History of Present Illness [name] [name]  is a 82 y.o. female, with history of CVA, hypertension, hyperlipidemia, hypothyroidism, diabetes mellitus type 2 came to hospital with worsening shortness of breath.  Patient also has been coughing up for past 3 days.  Also coughing up productive phlegm.  She complains of chest pain due to coughing.  Denies nausea vomiting or diarrhea.   PT Comments    Pt was seen for visit to ck O2 sats on room air, noted her sats were 91% in bed on room air, then 95% walking and finally 94% at rest.  Pt returned to O2 due to elevation of pulse which remained at 101 at rest.  Follow for ex's and gait as tolerated as she awaits return home to HHPT as insurance has refused to allow SNF stay.  Follow Up Recommendations  SNF     Equipment Recommendations  Rolling walker with 5" wheels    Recommendations for Other Services       Precautions / Restrictions Precautions Precautions: Fall Precaution Comments: watch O2 sats Restrictions Weight Bearing Restrictions: No    Mobility  Bed Mobility Overal bed mobility: Modified Independent Bed Mobility: Supine to Sit     Supine to sit: Supervision     General bed mobility comments: sat on side of bed at end of visit  Transfers Overall transfer level: Modified independent Equipment used: None Transfers: Sit to/from Stand Sit to Stand: Supervision            Ambulation/Gait Ambulation/Gait assistance: Min guard Gait Distance (Feet): 180 Feet Assistive device: None Gait Pattern/deviations: Decreased stride length;Step-through pattern;Narrow base of support;Trunk flexed Gait velocity: reduced       Stairs             Wheelchair Mobility    Modified Rankin (Stroke Patients Only)       Balance Overall balance assessment: Needs assistance Sitting-balance  support: Feet supported Sitting balance-Leahy Scale: Good     Standing balance support: No upper extremity supported Standing balance-Leahy Scale: Fair                              Cognition Arousal/Alertness: Awake/alert Behavior During Therapy: WFL for tasks assessed/performed Overall Cognitive Status: Within Functional Limits for tasks assessed                                        Exercises      General Comments General comments (skin integrity, edema, etc.): MD in to speak to pt and noted her O2 sats were WFL at 95% pregait to 94% post gait, pulse 101  ON ROOM AIRat rest      Pertinent Vitals/Pain Pain Assessment: No/denies pain    Home Living                      Prior Function            PT Goals (current goals can now be found in the care plan section) Acute Rehab PT Goals Patient Stated Goal: return home Potential to Achieve Goals: Good Progress towards PT goals: Progressing toward goals    Frequency    Min 3X/week      PT Plan Current plan remains appropriate  Co-evaluation              AM-PAC PT "6 Clicks" Daily Activity  Outcome Measure  Difficulty turning over in bed (including adjusting bedclothes, sheets and blankets)?: A Little Difficulty moving from lying on back to sitting on the side of the bed? : A Little Difficulty sitting down on and standing up from a chair with arms (e.g., wheelchair, bedside commode, etc,.)?: A Little Help needed moving to and from a bed to chair (including a wheelchair)?: A Little Help needed walking in hospital room?: A Little Help needed climbing 3-5 steps with a railing? : A Little 6 Click Score: 18    End of Session Equipment Utilized During Treatment: Gait belt(ON ROOM AIR) Activity Tolerance: Patient tolerated treatment well Patient left: in bed;with call bell/phone within reach Nurse Communication: Mobility status PT Visit Diagnosis: Unsteadiness on feet  (R26.81);Other abnormalities of gait and mobility (R26.89);Muscle weakness (generalized) (M62.81)     Time: 1610-9604 PT Time Calculation (min) (ACUTE ONLY): 27 min  Charges:  $Gait Training: 8-22 mins $Therapeutic Activity: 8-22 mins                    Ivar Drape 09/18/2018, 8:46 PM  Samul Dada, PT MS Acute Rehab Dept. Number: [facility] [id number] and [facility] [phone]

## 2018-09-18 NOTE — Progress Notes (Signed)
Patient denied skilled nursing facility rehab stay anticipate discharge in 48 to 72 hours if her bronchospasm improves patient undergoing physical therapy require home health and physical therapy at [name] [name] [id number] DOB: Aug 30, 1936 DOA: 09/13/2018 PCP: Oval Linsey, MD   Physical Exam: Blood pressure (!) 146/70, pulse 73, temperature 98.3 F (36.8 C), temperature source Oral, resp. rate 18, height 5' 4.5" (1.638 m), weight 88.5 kg, SpO2 95 %.  Lungs show moderate expiratory wheezes scattered coarse rhonchi no rales audible heart regular rhythm no S3-S4 no heaves thrills rubs abdomen soft nontender bowel sounds normoactive   Investigations:  No results found for this or any previous visit (from the past 240 hour(s)).   Basic Metabolic Panel: Recent Labs    09/17/18 0513 09/18/18 0610  NA 139 136  K 4.1 4.3  CL 105 101  CO2 28 30  GLUCOSE 68* 206*  BUN 26* 23  CREATININE 0.93 0.78  CALCIUM 8.5* 8.3*   Liver Function Tests: No results for input(s): AST, ALT, ALKPHOS, BILITOT, PROT, ALBUMIN in the last 72 hours.   CBC: No results for input(s): WBC, NEUTROABS, HGB, HCT, MCV, PLT in the last 72 hours.  No results found.    Medications:   Impression:  Principal Problem:   COPD exacerbation (HCC) Active Problems:   Hypothyroidism   DM type 2 causing complication (HCC)   Essential hypertension   Sleep apnea   Noncompliance with CPAP treatment   OSA (obstructive sleep apnea)     Plan: Continue prednisone 60 mg/day chest x-ray PA and lateral today continue DuoNeb nebulizers as well as Mucinex DM for her antitussive effects.  Continue physical therapy for strengthening and ambulation  Consultants:    Procedures   Antibiotics:          Time spent: 30 minutes   LOS: 4 days   Wafa Martes M   09/18/2018, 12:18 PM

## 2018-09-18 NOTE — Care Management Note (Signed)
Case Management Note  Patient Details  Name: [name] [name] MRN: [id number] Date of Birth: [birth year]       Admitted with COPD exacerbation Pt from home with husband and active with [facility]. PT recommends SNF, [facility] has denied. Pt will DC back home with resumption of HH services, aware HH has 48 hrs to make resumption visit. Bonita Quin, [facility] rep, aware of plan. Potential for DC home this weekend.              Expected Discharge Date:  09/15/18               Expected Discharge Plan:  Home w Home Health Services  In-House Referral:  Clinical Social Work  Discharge planning Services  CM Consult  Post Acute Care Choice:  Home Health, Resumption of Svcs/PTA Provider Choice offered to:  Patient  HH Arranged:  RN, PT [facility] Agency:  [facility]  Status of Service:  Completed, signed off  Malcolm Metro, RN 09/18/2018, 11:03 AM

## 2018-09-18 NOTE — Clinical Social Work Note (Signed)
Notified by Lorina Rabon at [facility] that [employer] denied SNF rehab for pt. Information for MD to MD appeal given to Dr. Janna Arch who states that he will dc pt home with resumption of HHC. Updated RN CM. Will update pt and then sign off.

## 2018-09-19 LAB — BASIC METABOLIC PANEL
ANION GAP: 7 (ref 5–15)
BUN: 19 mg/dL (ref 8–23)
CO2: 29 mmol/L (ref 22–32)
Calcium: 8.6 mg/dL — ABNORMAL LOW (ref 8.9–10.3)
Chloride: 102 mmol/L (ref 98–111)
Creatinine, Ser: 0.67 mg/dL (ref 0.44–1.00)
GFR calc Af Amer: >60 mL/min (ref >60)
GFR calc non Af Amer: >60 mL/min (ref >60)
GLUCOSE: 129 mg/dL — AB (ref 70–99)
POTASSIUM: 4.1 mmol/L (ref 3.5–5.1)
Sodium: 138 mmol/L (ref 135–145)

## 2018-09-19 LAB — GLUCOSE, CAPILLARY
GLUCOSE-CAPILLARY: 134 mg/dL — AB (ref 70–99)
Glucose-Capillary: 125 mg/dL — ABNORMAL HIGH (ref 70–99)
Glucose-Capillary: 147 mg/dL — ABNORMAL HIGH (ref 70–99)

## 2018-09-19 NOTE — Progress Notes (Signed)
Wheezing continues mild to moderate expiratory bilateral despite 60 mg of prednisone and nebulizers with DuoNeb patient undergoing physical therapy O2 saturations good when O2 was discontinued.  Chest x-ray reveals no evidence of infiltrates.[name] [name] ZOX:[id number] DOB: 03-02-1936 DOA: 09/13/2018 PCP: [name] [name], [name] [name]   Physical Exam: Blood pressure (!) 151/69, pulse (!) 53, temperature 98.4 F (36.9 C), resp. rate 18, height 5' 4.5" (1.638 m), weight 88.5 kg, SpO2 94 %.  Lungs show mild to moderate expiratory wheezes scattered rhonchi no diminished breath sounds in the bases no rales auscultated heart regular rhythm no S3-S4 no heaves thrills rubs   Investigations:  No results found for this or any previous visit (from the past 240 hour(s)).   Basic Metabolic Panel: Recent Labs    09/18/18 0610 09/19/18 0602  NA 136 138  K 4.3 4.1  CL 101 102  CO2 30 29  GLUCOSE 206* 129*  BUN 23 19  CREATININE 0.78 0.67  CALCIUM 8.3* 8.6*   Liver Function Tests: No results for input(s): AST, ALT, ALKPHOS, BILITOT, PROT, ALBUMIN in the last 72 hours.   CBC: No results for input(s): WBC, NEUTROABS, HGB, HCT, MCV, PLT in the last 72 hours.  Dg Chest 2 View  Result Date: 09/18/2018 CLINICAL DATA:  Asthmatic bronchitis EXAM: CHEST - 2 VIEW COMPARISON:  September 13, 2018 FINDINGS: There is scarring in the left upper lobe region. There is no appreciable edema or consolidation. Heart size and pulmonary vascularity are normal. Patient is status post coronary artery bypass grafting. No adenopathy. Postoperative changes noted in the proximal right femur. Calcification is noted in the right carotid artery. IMPRESSION: Scarring left upper lobe, stable. No edema or consolidation. Stable cardiac silhouette. Status post coronary artery bypass grafting. There is a focal area of calcification noted in the right carotid artery. Electronically Signed   By: Bretta Bang III M.D.   On: 09/18/2018  14:30      Medications:  Impression: *Principal Problem:   COPD exacerbation (HCC) Active Problems:   Hypothyroidism   DM type 2 causing complication (HCC)   Essential hypertension   Sleep apnea   Noncompliance with CPAP treatment   OSA (obstructive sleep apnea)     Plan: Continue DuoNeb prednisone 60 mg p.o. daily physical therapy consider discharge tomorrow patient does not require O2 at rest or upon ambulation  Consultants:    Procedures   Antibiotics:       Time spent: 30 minutes   LOS: 5 days   [name] [name] M   09/19/2018, 9:58 AM

## 2018-09-20 LAB — CREATININE, SERUM
CREATININE: 0.68 mg/dL (ref 0.44–1.00)
GFR calc Af Amer: >60 mL/min (ref >60)
GFR calc non Af Amer: >60 mL/min (ref >60)

## 2018-09-20 LAB — GLUCOSE, CAPILLARY
GLUCOSE-CAPILLARY: 57 mg/dL — AB (ref 70–99)
Glucose-Capillary: 89 mg/dL (ref 70–99)
Glucose-Capillary: 78 mg/dL (ref 70–99)

## 2018-09-20 MED ORDER — SULFAMETHOXAZOLE-TRIMETHOPRIM 800-160 MG PO TABS: 1.0000 | ORAL_TABLET | Freq: Every day | ORAL | 5 refills | Status: DC

## 2018-09-20 MED ORDER — LORAZEPAM 1 MG PO TABS: 1.0000 mg | ORAL_TABLET | Freq: Every day | ORAL | 0 refills | Status: DC

## 2018-09-20 MED ORDER — MUCUS RELIEF DM 30-600 MG PO TB12: 1.0000 | ORAL_TABLET | Freq: Two times a day (BID) | ORAL | 1 refills | Status: DC

## 2018-09-20 MED ORDER — CITALOPRAM HYDROBROMIDE 20 MG PO TABS: 20.0000 mg | ORAL_TABLET | Freq: Every day | ORAL | 2 refills | Status: DC

## 2018-09-20 MED ORDER — DEXTROSE 50 % IV SOLN
INTRAVENOUS | Status: AC
  Administered 2018-09-20: 50 mL
  Filled 2018-09-20: qty 50

## 2018-09-20 MED ORDER — DOCUSATE SODIUM 100 MG PO CAPS: 100.0000 mg | ORAL_CAPSULE | Freq: Every day | ORAL | 0 refills | Status: DC

## 2018-09-20 NOTE — Progress Notes (Signed)
IV removed, patient tolerated well.  Reviewed AVS with patient who verbalized understanding.  Patient transported home by her daughter.   

## 2018-09-20 NOTE — Care Management (Signed)
Spoke with Clydie Braun, liaison for [facility], who states patient wants to switch back to [facility] because she will have copays with [facility].  Pt was unwilling to sign form to say she would be responsible for copays.    Vaughan Basta, liaison for [facility], notified and will accept referral.

## 2018-09-20 NOTE — Discharge Summary (Signed)
Physician Discharge Summary  [name] [name] WUJ:[id number] DOB: [birth year] DOA: 09/13/2018  PCP: [name] Linsey, MD  Admit date: 09/13/2018 Discharge date: 09/20/2018   Recommendations for Outpatient Follow-up:  Patient is strongly advised to take prednisone 20 mg tablets take 3 tablets daily for 7 days then decrease to 2 tablets daily for his 7 days then 1 tablet daily for 7 days then discontinue.  Requires to take DuoNeb nebulizer treatment unit dose 4 times daily via nebulizer machine.  Likewise to take Septra DS 1 tablet p.o. daily for suppressive therapy of chronic UTI.  She is to take 40 units of 70/30 insulin before meals twice daily daily.  She is to take oxycodone 5 mg 4 times daily daily PRN for pain.  She is to take Celexa 20 mg p.o. day daily for her generalized anxiety she is to take lorazepam 1 mg p.o. at bedtime as needed for chronic insomnia.  Unresponsive to any other medicines.  Likewise she is to take every other prehospital admission medicine and follow-up in my office in 10 days time she is advised to see Dr. Shaune Pollack either Wednesday or Thursday in my absence she understands I will be away for 8 days on vacation. Discharge Diagnoses:  Principal Problem:   COPD exacerbation (HCC) Active Problems:   Hypothyroidism   DM type 2 causing complication (HCC)   Essential hypertension   Sleep apnea   Noncompliance with CPAP treatment   OSA (obstructive sleep apnea)   Discharge Condition: Good  Filed Weights   09/17/18 0628 09/19/18 0624 09/20/18 0700  Weight: 88.5 kg 88.5 kg 88.8 kg    History of present illness:  Patient is a delightful 82 year old white female with a history of chronic bronchitis generalized severe anxiety disorder chronic pain multiple arthritic conditions hypertension hyperlipidemia type 2 insulin-dependent diabetes chronic UTI now on suppressive antibacterial therapy who is also becoming steroid dependent for COPD she was admitted to the  hospital with increasing dyspnea chronic UTI dysuria frequency on previous hospital admission 10 days before he refused a rehab stay and cultures of the urine grew out an organism which is only responsive orally to Keflex and to Septra she is on Septra DS 1 a day for suppressive therapy she had her COPD treated with high-dose steroids she was difficult to control took quite a while she gets increasing hyperglycemia due to the steroids but as the steroids were weaned down her's glycemic control became better she was taking 40 units of NovoLog 70/30 AC twice daily she is on this regimen for financial reasons her hypertension was well controlled she sees to complain of dysuria on antibiotic therapy chest x-ray revealed no evidence of infiltrate she was admitted with acute bronchitis all the above issues were either controlled or improved to such a degree she was discharged on the ninth or 10th hospital day after being turned down for a rehab stay she was seen by physical therapy and hospital and was ambulating with assistance quite well she likewise had her oxygen discontinued the last 2 to 3 days and she had no desaturations even upon walking with the physical therapist Hospital Course:  See HPI above  Procedures:    Consultations:  Physical therapy and pharmacy  Discharge Instructions  Discharge Instructions    Discharge instructions   Complete by:  As directed      Allergies as of 09/20/2018      Reactions   Codeine Itching      Medication List  STOP taking these medications   azithromycin 250 MG tablet Commonly known as:  ZITHROMAX     TAKE these medications   amLODipine 5 MG tablet Commonly known as:  NORVASC Take 5 mg by mouth daily.   aspirin EC 325 MG tablet Take 325 mg by mouth daily.   citalopram 20 MG tablet Commonly known as:  CELEXA Take 1 tablet (20 mg total) by mouth daily. Start taking on:  09/21/2018   docusate sodium 100 MG capsule Commonly known as:   COLACE Take 1 capsule (100 mg total) by mouth daily. Start taking on:  09/21/2018   gabapentin 300 MG capsule Commonly known as:  NEURONTIN Take 300 mg by mouth 2 (two) times daily.   insulin NPH-regular Human (70-30) 100 UNIT/ML injection Commonly known as:  NOVOLIN 70/30 Inject 40 Units into the skin 2 (two) times daily.   ipratropium-albuterol 0.5-2.5 (3) MG/3ML Soln Commonly known as:  DUONEB Inhale 3 mLs into the lungs 4 (four) times daily as needed.   levothyroxine 25 MCG tablet Commonly known as:  SYNTHROID, LEVOTHROID Take 25 mcg by mouth daily.   lisinopril 10 MG tablet Commonly known as:  PRINIVIL,ZESTRIL Take 10 mg by mouth daily.   LORazepam 1 MG tablet Commonly known as:  ATIVAN Take 1 tablet (1 mg total) by mouth at bedtime.   metFORMIN 500 MG tablet Commonly known as:  GLUCOPHAGE Take 500 mg by mouth 2 (two) times daily with a meal.   MUCUS RELIEF DM 30-600 MG Tb12 Take 1 tablet by mouth every 12 (twelve) hours.   nitroGLYCERIN 0.4 MG SL tablet Commonly known as:  NITROSTAT Place 0.4 mg under the tongue every 5 (five) minutes as needed for chest pain.   oxyCODONE 5 MG immediate release tablet Commonly known as:  Oxy IR/ROXICODONE Take 1 tablet (5 mg total) by mouth every 4 (four) hours as needed (Moderate pain).   predniSONE 20 MG tablet Commonly known as:  DELTASONE TAKE 3 TABS X 7 DAYS, THEN 2 TABS X 7 DAYS, THEN 1 TAB X 7 DAYS   sulfamethoxazole-trimethoprim 800-160 MG tablet Commonly known as:  BACTRIM DS,SEPTRA DS Take 1 tablet by mouth daily.   triamcinolone cream 0.1 % Commonly known as:  KENALOG APPLY TO AFFECTED AREA TWICE A DAY AS DIRECTED FOR FLARES      Allergies  Allergen Reactions  . Codeine Itching    Contact information for follow-up providers    [facility].   Specialty:  Home Health Services Contact information: [address] [place] [address] [phone]             Contact information for after-discharge care    Destination    [facility] Preferred SNF .   Service:  Skilled Nursing Contact information: [address] [place] [address] [phone]                   The results of significant diagnostics from this hospitalization (including imaging, microbiology, ancillary and laboratory) are listed below for reference.    Significant Diagnostic Studies: Ct Angio Head W Or Wo Contrast  Result Date: 08/27/2018 CLINICAL DATA:  Initial evaluation for acute confusion, left-sided facial droop. EXAM: CT ANGIOGRAPHY HEAD AND NECK TECHNIQUE: Multidetector CT imaging of the head and neck was performed using the standard protocol during bolus administration of intravenous contrast. Multiplanar CT image reconstructions and MIPs were obtained to evaluate the vascular anatomy. Carotid stenosis measurements (when applicable)  are obtained utilizing NASCET criteria, using the distal internal carotid diameter as the denominator. CONTRAST:  ISOVUE-370 IOPAMIDOL (ISOVUE-370) INJECTION 76% COMPARISON:  Prior CT from 08/23/2018. FINDINGS: CT HEAD FINDINGS Brain: Moderate cerebral atrophy with chronic small vessel ischemic disease. No acute intracranial hemorrhage. No acute large vessel territory infarct. No mass lesion, midline shift or mass effect. No hydrocephalus. No extra-axial fluid collection. Vascular: No hyperdense vessel. Scattered vascular calcifications noted within the carotid siphons. Skull: Scalp soft tissues and calvarium within normal limits. Sinuses: Partial opacification of the visualized right sphenoid sinus. Visualized paranasal sinuses and mastoid air cells are otherwise clear. Orbits: Globes and orbital soft tissues within normal limits. Review of the MIP images confirms the above findings CTA NECK FINDINGS Aortic arch: Visualized aortic arch of normal caliber with normal 3 vessel morphology. No hemodynamically  significant stenosis about the origin of the great vessels. Visualized subclavian arteries widely patent. Right carotid system: Right common carotid artery tortuous proximally but widely patent to the bifurcation. Scattered mixed plaque about the right bifurcation without hemodynamically significant stenosis. Right ICA tortuous but widely patent to the skull base without stenosis, dissection, or occlusion. Left carotid system: Left common carotid artery tortuous proximally but widely patent to the bifurcation without stenosis. Mixed atheromatous plaque about the left bifurcation without hemodynamically significant stenosis. Left ICA tortuous but widely patent distally to the skull base without stenosis, dissection or occlusion. Vertebral arteries: Both of the vertebral arteries arise from the subclavian arteries. Vertebral arteries widely patent within the neck without stenosis, dissection, or occlusion. Skeleton: No acute osseus abnormality. No worrisome lytic or blastic osseous lesions. Moderate cervical spondylolysis at C5-6 and C6-7. Other neck: Visualized soft tissues of the neck demonstrate no acute finding. Salivary glands normal. Thyroid normal. No adenopathy. Upper chest: Visualized upper chest within normal limits. Sequelae of prior CABG partially visualized. Postsurgical changes and/or scarring noted at the lung apices bilaterally. Review of the MIP images confirms the above findings CTA HEAD FINDINGS Anterior circulation: Petrous segments widely patent bilaterally. Moderate to advanced atheromatous change throughout the carotid siphons bilaterally. Associated moderate approximate 50% stenosis at the supraclinoid left ICA, with moderate to severe approximate 75% stenosis at the supraclinoid right ICA. ICA termini patent bilaterally. Left A1 dominant and widely patent. Right A1 diminutive with severe proximal right A1 stenosis (series 11, image 78). Normal anterior communicating artery. Short-segment  severe distal left A2 stenosis (series 11, image 61). Anterior cerebral arteries otherwise patent to its distal aspects. M1 segments patent without high-grade stenosis or occlusion. Normal MCA bifurcations. Short-segment severe proximal left M2 stenosis (series 10, image 100). No proximal M2 occlusion. Distal MCA branches demonstrate multifocal atheromatous irregularity but are well perfused and fairly symmetric. Posterior circulation: Vertebral arteries patent to the vertebrobasilar junction without stenosis. Posterior inferior cerebral arteries patent bilaterally. Basilar patent to its distal aspect without stenosis. Superior cerebral arteries patent bilaterally. Left PCA supplied via the basilar. Fetal type origin of the right PCA. Multifocal moderate to severe bilateral P2 stenoses, right worse than left. PCAs are patent to their distal aspects. Venous sinuses: Patent. Anatomic variants: Fetal type origin of the right PCA. Hypoplastic right P1 segment. Delayed phase: No abnormal enhancement. Review of the MIP images confirms the above findings IMPRESSION: CT HEAD IMPRESSION 1. No acute intracranial abnormality. 2. Moderate cerebral atrophy with chronic small vessel ischemic disease. CTA HEAD AND NECK IMPRESSION 1. Negative CTA for emergent large vessel occlusion. 2. Moderate to advanced atherosclerotic change throughout the intracranial circulation with multifocal  moderate to severe stenoses as detailed above. Changes most notable at the supraclinoid ICAs, right A1 segment, and bilateral P2 segments. 3. Mild atheromatous change about the carotid bifurcations without significant stenosis. No critical flow limiting stenosis within the neck. Electronically Signed   By: Rise Mu M.D.   On: 08/27/2018 23:19   Dg Chest 2 View  Result Date: 09/18/2018 CLINICAL DATA:  Asthmatic bronchitis EXAM: CHEST - 2 VIEW COMPARISON:  September 13, 2018 FINDINGS: There is scarring in the left upper lobe region. There  is no appreciable edema or consolidation. Heart size and pulmonary vascularity are normal. Patient is status post coronary artery bypass grafting. No adenopathy. Postoperative changes noted in the proximal right femur. Calcification is noted in the right carotid artery. IMPRESSION: Scarring left upper lobe, stable. No edema or consolidation. Stable cardiac silhouette. Status post coronary artery bypass grafting. There is a focal area of calcification noted in the right carotid artery. Electronically Signed   By: Bretta Bang III M.D.   On: 09/18/2018 14:30   Dg Chest 2 View  Result Date: 09/13/2018 CLINICAL DATA:  82 y/o  F; cough and shortness of breath. EXAM: CHEST - 2 VIEW COMPARISON:  06/29/2018 chest radiograph. FINDINGS: Stable normal cardiac silhouette given projection and technique. Status post CABG with median sternotomy wires aligned and intact. Right upper quadrant cholecystectomy clips. Aortic atherosclerosis with calcification. Mild bronchitic changes. No focal consolidation. No pleural effusion or pneumothorax. Right proximal femur rotator cuff repair and humerus fixation hardware, partially visualized. IMPRESSION: 1. Mild bronchitic changes.  No focal consolidation. 2.  Aortic Atherosclerosis (ICD10-I70.0). Electronically Signed   By: Mitzi Hansen M.D.   On: 09/13/2018 01:19   Ct Head Wo Contrast  Result Date: 08/23/2018 CLINICAL DATA:  Seven 21-year-old female with head trauma. EXAM: CT HEAD WITHOUT CONTRAST TECHNIQUE: Contiguous axial images were obtained from the base of the skull through the vertex without intravenous contrast. COMPARISON:  Head CT dated 06/10/2018 FINDINGS: Brain: There is mild age-related atrophy and chronic microvascular ischemic changes. No acute intracranial hemorrhage. Mass effect or midline shift. No extra-axial fluid collection. Vascular: No hyperdense vessel or unexpected calcification. Skull: Normal. Negative for fracture or focal lesion.  Sinuses/Orbits: The visualized paranasal sinuses and the right mastoid air cells are clear. Mild left mastoid effusions. Other: Mild right temporal scalp contusion. IMPRESSION: 1. No acute intracranial hemorrhage. 2. Age-related atrophy and chronic microvascular ischemic changes. Electronically Signed   By: Elgie Collard M.D.   On: 08/23/2018 05:01   Ct Angio Neck W Or Wo Contrast  Result Date: 08/27/2018 CLINICAL DATA:  Initial evaluation for acute confusion, left-sided facial droop. EXAM: CT ANGIOGRAPHY HEAD AND NECK TECHNIQUE: Multidetector CT imaging of the head and neck was performed using the standard protocol during bolus administration of intravenous contrast. Multiplanar CT image reconstructions and MIPs were obtained to evaluate the vascular anatomy. Carotid stenosis measurements (when applicable) are obtained utilizing NASCET criteria, using the distal internal carotid diameter as the denominator. CONTRAST:  ISOVUE-370 IOPAMIDOL (ISOVUE-370) INJECTION 76% COMPARISON:  Prior CT from 08/23/2018. FINDINGS: CT HEAD FINDINGS Brain: Moderate cerebral atrophy with chronic small vessel ischemic disease. No acute intracranial hemorrhage. No acute large vessel territory infarct. No mass lesion, midline shift or mass effect. No hydrocephalus. No extra-axial fluid collection. Vascular: No hyperdense vessel. Scattered vascular calcifications noted within the carotid siphons. Skull: Scalp soft tissues and calvarium within normal limits. Sinuses: Partial opacification of the visualized right sphenoid sinus. Visualized paranasal sinuses and mastoid air cells  are otherwise clear. Orbits: Globes and orbital soft tissues within normal limits. Review of the MIP images confirms the above findings CTA NECK FINDINGS Aortic arch: Visualized aortic arch of normal caliber with normal 3 vessel morphology. No hemodynamically significant stenosis about the origin of the great vessels. Visualized subclavian arteries widely  patent. Right carotid system: Right common carotid artery tortuous proximally but widely patent to the bifurcation. Scattered mixed plaque about the right bifurcation without hemodynamically significant stenosis. Right ICA tortuous but widely patent to the skull base without stenosis, dissection, or occlusion. Left carotid system: Left common carotid artery tortuous proximally but widely patent to the bifurcation without stenosis. Mixed atheromatous plaque about the left bifurcation without hemodynamically significant stenosis. Left ICA tortuous but widely patent distally to the skull base without stenosis, dissection or occlusion. Vertebral arteries: Both of the vertebral arteries arise from the subclavian arteries. Vertebral arteries widely patent within the neck without stenosis, dissection, or occlusion. Skeleton: No acute osseus abnormality. No worrisome lytic or blastic osseous lesions. Moderate cervical spondylolysis at C5-6 and C6-7. Other neck: Visualized soft tissues of the neck demonstrate no acute finding. Salivary glands normal. Thyroid normal. No adenopathy. Upper chest: Visualized upper chest within normal limits. Sequelae of prior CABG partially visualized. Postsurgical changes and/or scarring noted at the lung apices bilaterally. Review of the MIP images confirms the above findings CTA HEAD FINDINGS Anterior circulation: Petrous segments widely patent bilaterally. Moderate to advanced atheromatous change throughout the carotid siphons bilaterally. Associated moderate approximate 50% stenosis at the supraclinoid left ICA, with moderate to severe approximate 75% stenosis at the supraclinoid right ICA. ICA termini patent bilaterally. Left A1 dominant and widely patent. Right A1 diminutive with severe proximal right A1 stenosis (series 11, image 78). Normal anterior communicating artery. Short-segment severe distal left A2 stenosis (series 11, image 61). Anterior cerebral arteries otherwise patent to  its distal aspects. M1 segments patent without high-grade stenosis or occlusion. Normal MCA bifurcations. Short-segment severe proximal left M2 stenosis (series 10, image 100). No proximal M2 occlusion. Distal MCA branches demonstrate multifocal atheromatous irregularity but are well perfused and fairly symmetric. Posterior circulation: Vertebral arteries patent to the vertebrobasilar junction without stenosis. Posterior inferior cerebral arteries patent bilaterally. Basilar patent to its distal aspect without stenosis. Superior cerebral arteries patent bilaterally. Left PCA supplied via the basilar. Fetal type origin of the right PCA. Multifocal moderate to severe bilateral P2 stenoses, right worse than left. PCAs are patent to their distal aspects. Venous sinuses: Patent. Anatomic variants: Fetal type origin of the right PCA. Hypoplastic right P1 segment. Delayed phase: No abnormal enhancement. Review of the MIP images confirms the above findings IMPRESSION: CT HEAD IMPRESSION 1. No acute intracranial abnormality. 2. Moderate cerebral atrophy with chronic small vessel ischemic disease. CTA HEAD AND NECK IMPRESSION 1. Negative CTA for emergent large vessel occlusion. 2. Moderate to advanced atherosclerotic change throughout the intracranial circulation with multifocal moderate to severe stenoses as detailed above. Changes most notable at the supraclinoid ICAs, right A1 segment, and bilateral P2 segments. 3. Mild atheromatous change about the carotid bifurcations without significant stenosis. No critical flow limiting stenosis within the neck. Electronically Signed   By: Rise Mu M.D.   On: 08/27/2018 23:19   [name] Contrast  Result Date: 08/28/2018 CLINICAL DATA:  Confusion.  Left facial droop.  Recent fall. EXAM: MRI HEAD WITHOUT CONTRAST TECHNIQUE: Multiplanar, multiecho pulse sequences of the brain and surrounding structures were obtained without intravenous contrast. COMPARISON:  CT head  08/27/2018 FINDINGS:  Brain: Negative for acute infarct. Moderate atrophy and moderate chronic microvascular ischemic change in the white matter and pons. Negative for hemorrhage or mass Image quality degraded by motion Vascular: Normal arterial flow voids Skull and upper cervical spine: Negative Sinuses/Orbits: Negative Other: None IMPRESSION: No acute abnormality. Moderate atrophy and moderate chronic microvascular ischemic change in the white matter and pons. Electronically Signed   By: Marlan Palau M.D.   On: 08/28/2018 08:06    Microbiology: No results found for this or any previous visit (from the past 240 hour(s)).   Labs: Basic Metabolic Panel: Recent Labs  Lab 09/14/18 0426 09/17/18 0513 09/18/18 0610 09/19/18 0602 09/20/18 0535  NA 138 139 136 138  --   K 5.1 4.1 4.3 4.1  --   CL 103 105 101 102  --   CO2 25 28 30 29   --   GLUCOSE 286* 68* 206* 129*  --   BUN 23 26* 23 19  --   CREATININE 0.82 0.93 0.78 0.67 0.68  CALCIUM 9.8 8.5* 8.3* 8.6*  --    Liver Function Tests: Recent Labs  Lab 09/14/18 0426  AST 13*  ALT 11  ALKPHOS 76  BILITOT 0.6  PROT 7.0  ALBUMIN 3.7   No results for input(s): LIPASE, AMYLASE in the last 168 hours. No results for input(s): AMMONIA in the last 168 hours. CBC: Recent Labs  Lab 09/14/18 0426  WBC 16.4*  HGB 12.5  HCT 40.9  MCV 82.6  PLT 331   Cardiac Enzymes: No results for input(s): CKTOTAL, CKMB, CKMBINDEX, TROPONINI in the last 168 hours. BNP: BNP (last 3 results) Recent Labs    02/28/18 1708 09/13/18 0026  BNP 72.0 63.0    ProBNP (last 3 results) No results for input(s): PROBNP in the last 8760 hours.  CBG: Recent Labs  Lab 09/19/18 1203 09/19/18 2033 09/20/18 0748 09/20/18 1146 09/20/18 1325  GLUCAP 134* 147* 89 57* 78       Signed:  Jadriel Saxer M   Pager: [phone] 09/20/2018, 1:46 PM

## 2018-09-24 IMAGING — CT CT HEAD W/O CM
3 series · 16 of 47 positions shown, 19 images · non-contrast
Comparison: CT scan of August 01, 2016.

CLINICAL DATA: Right-sided numbness, generalized weakness.

EXAM:
CT HEAD WITHOUT CONTRAST
TECHNIQUE: Contiguous axial images were obtained from the base of the skull
through the vertex without intravenous contrast.

[Series 2: head trauma wo · axial · 0.47mm/px · z∈[+317,+457]mm · 10 of 34 slices shown, 13 images]
[im 3/34  brain]
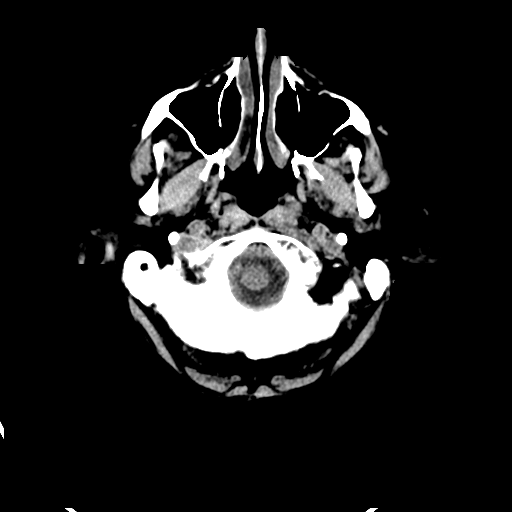
[im 3/34  bone]
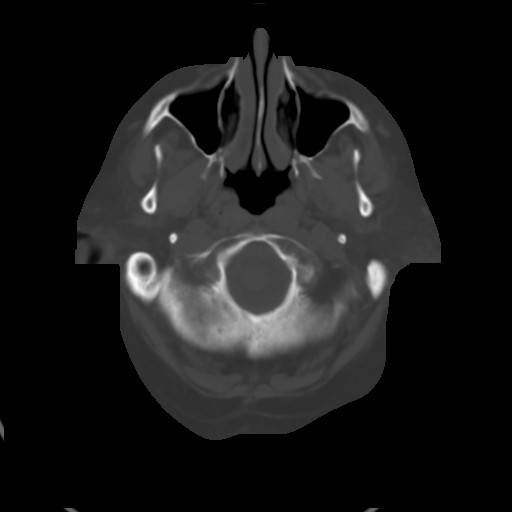
[im 6/34  brain]
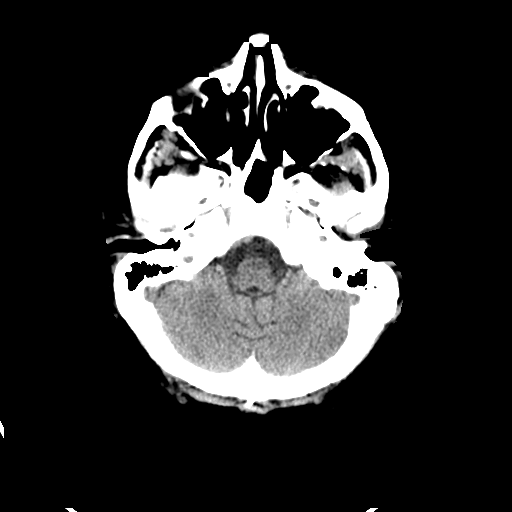
[im 10/34  brain]
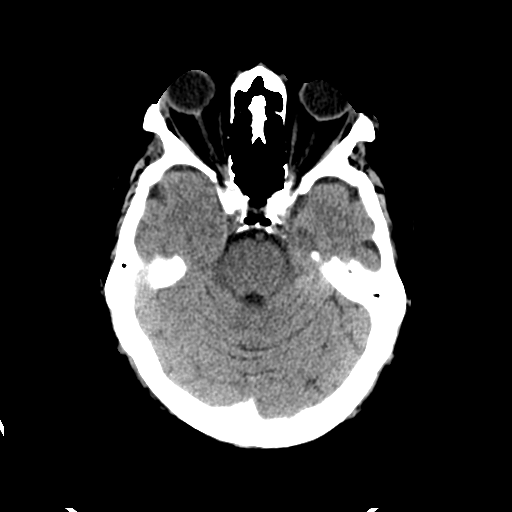
[im 12/34  brain]
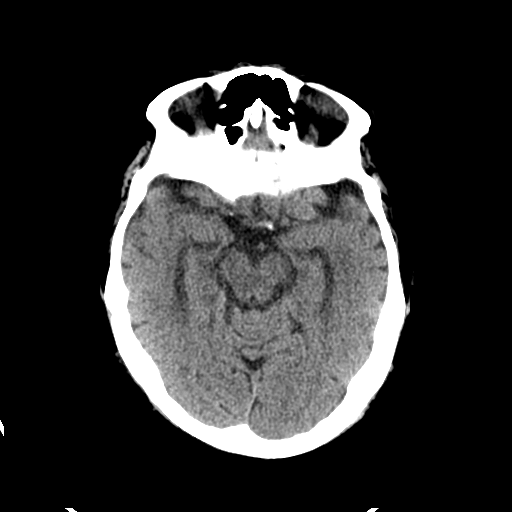
[im 15/34  brain]
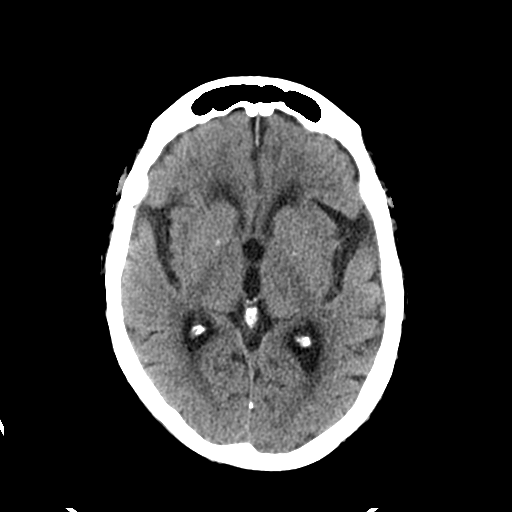
[im 15/34  bone]
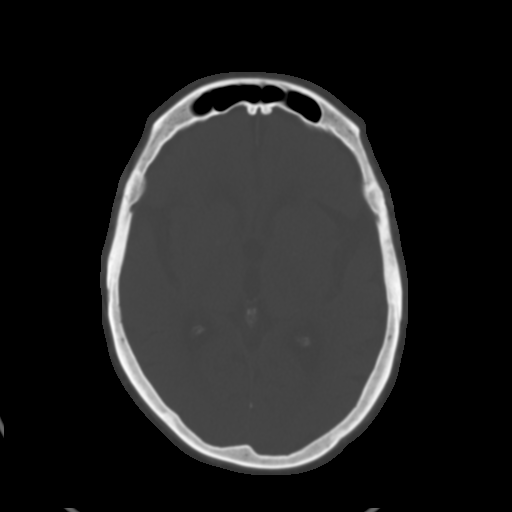
[im 19/34  brain]
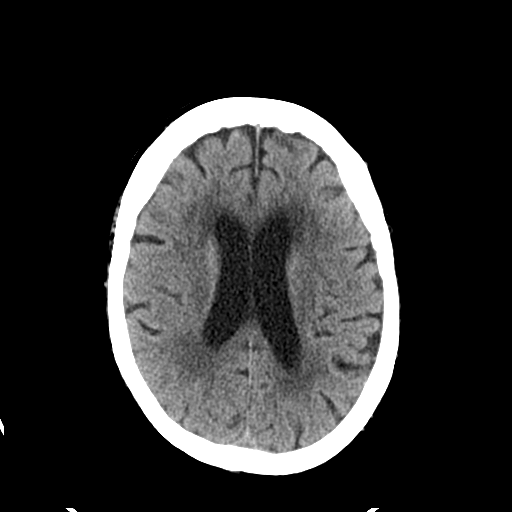
[im 22/34  brain]
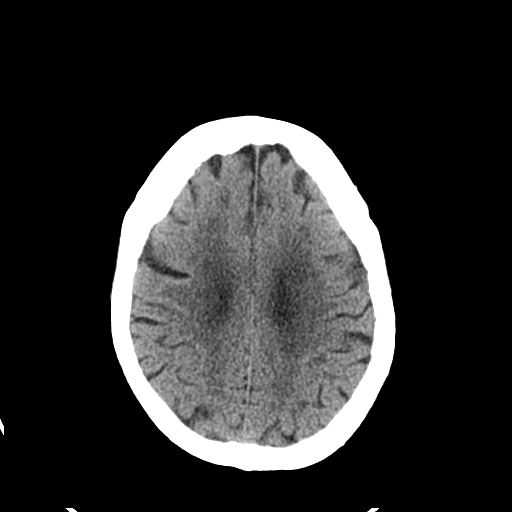
[im 26/34  brain]
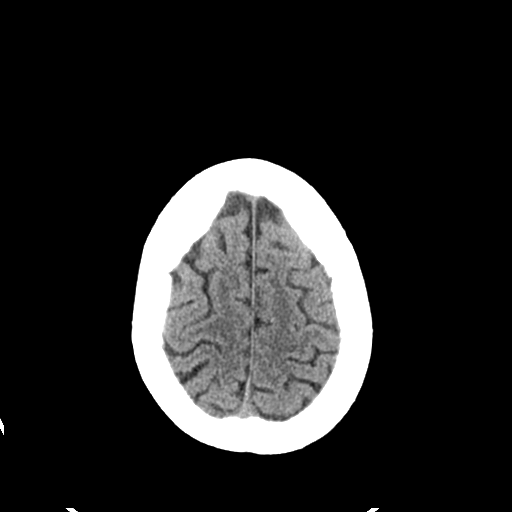
[im 28/34  brain]
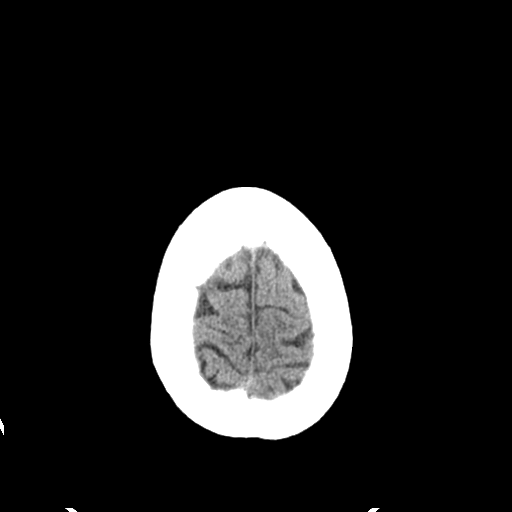
[im 28/34  bone]
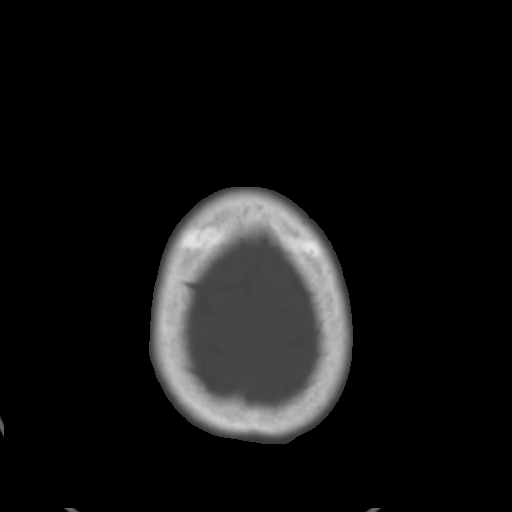
[im 31/34  brain]
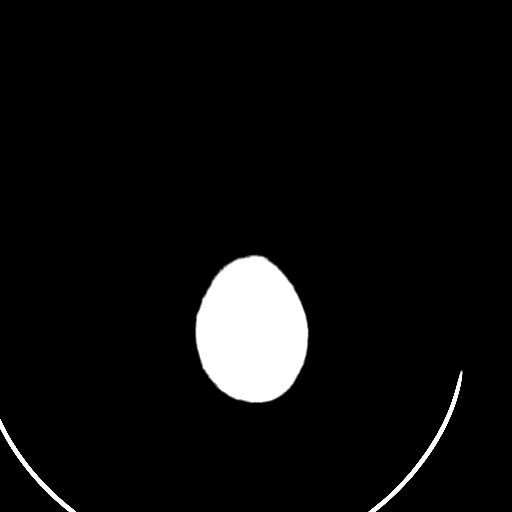

[Series 4: coronal soft tissue · coronal · 0.32mm/px · 3 of 75 slices shown]
[im 25/75  brain]
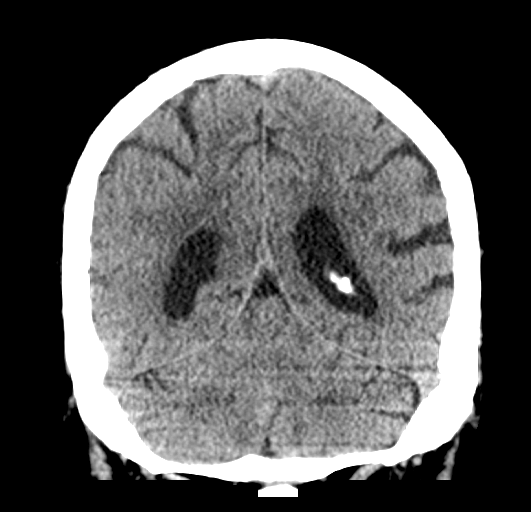
[im 33/75  brain]
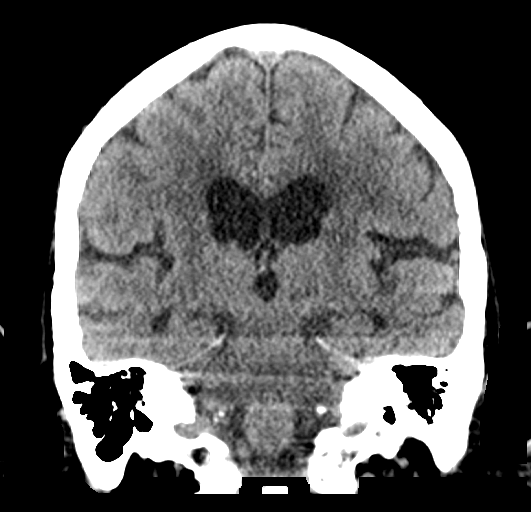
[im 42/75  brain]
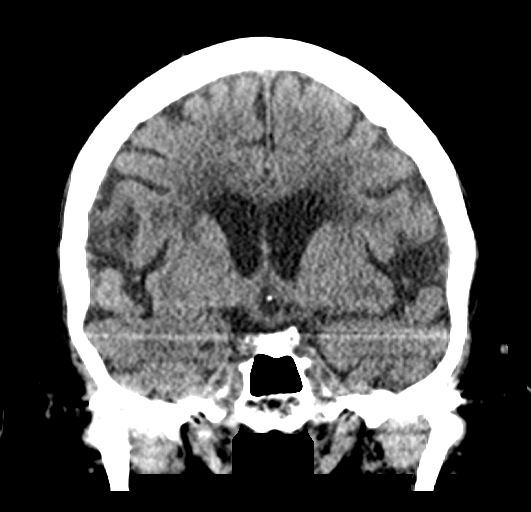

[Series 5: sagittal soft tissue · sagittal · 0.32mm/px · 3 of 63 slices shown]
[im 21/63  brain]
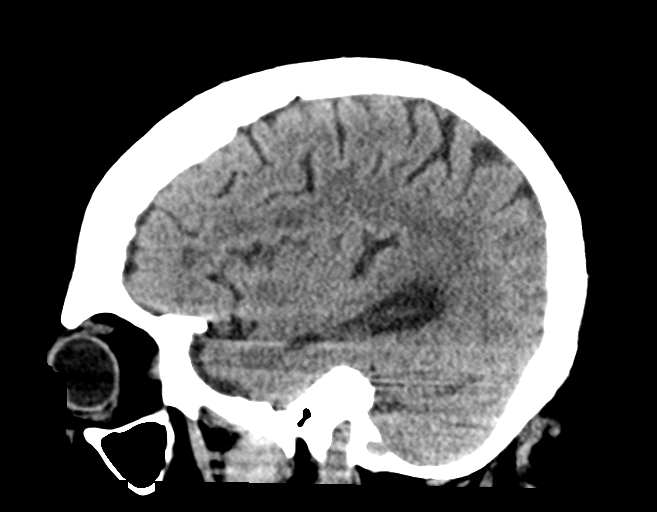
[im 32/63  brain]
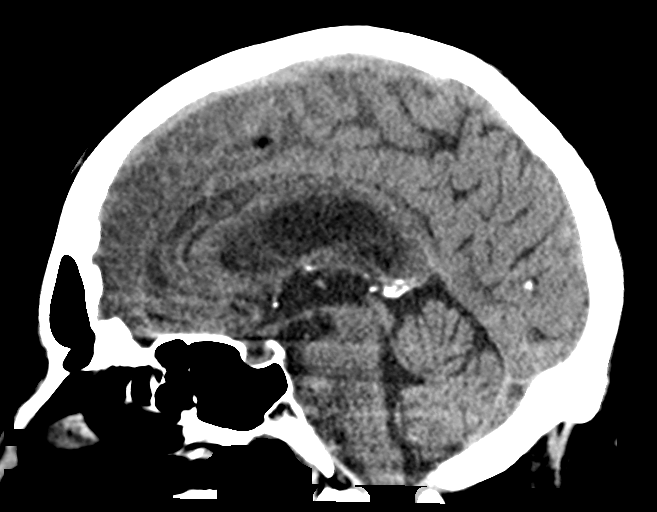
[im 42/63  brain]
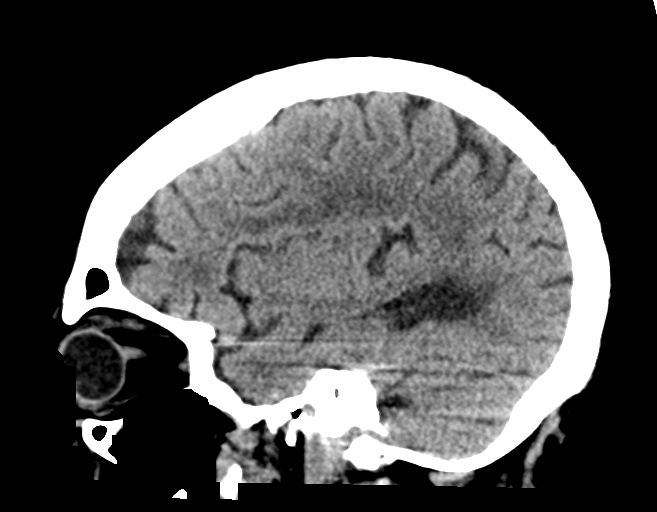

[16 of 47 positions shown; findings below may reference images not displayed]

FINDINGS: Brain: Mild diffuse cortical atrophy is noted. Mild chronic ischemic
white matter disease is noted. No mass effect or midline shift is
noted. Ventricular size is within normal limits. There is no
evidence of mass lesion, hemorrhage or acute infarction.

Vascular: No abnormality seen.

Skull: Bony calvarium appears intact.

Sinuses/Orbits: Visualized paranasal sinuses appear normal.

Other: None.
IMPRESSION: Mild diffuse cortical atrophy. Mild chronic ischemic white matter
disease. No acute intracranial abnormality seen.

## 2018-09-24 IMAGING — CR DG CHEST 1V PORT
1 series · 1 of 1 positions shown · non-contrast
Comparison: 07/23/2016

CLINICAL DATA: Chest pain radiating to the right in since 10 a.m.
this morning. Nausea and vomiting yesterday. Nonsmoker with history
of hypertension and diabetes.

EXAM:
PORTABLE CHEST 1 VIEW

[portable]
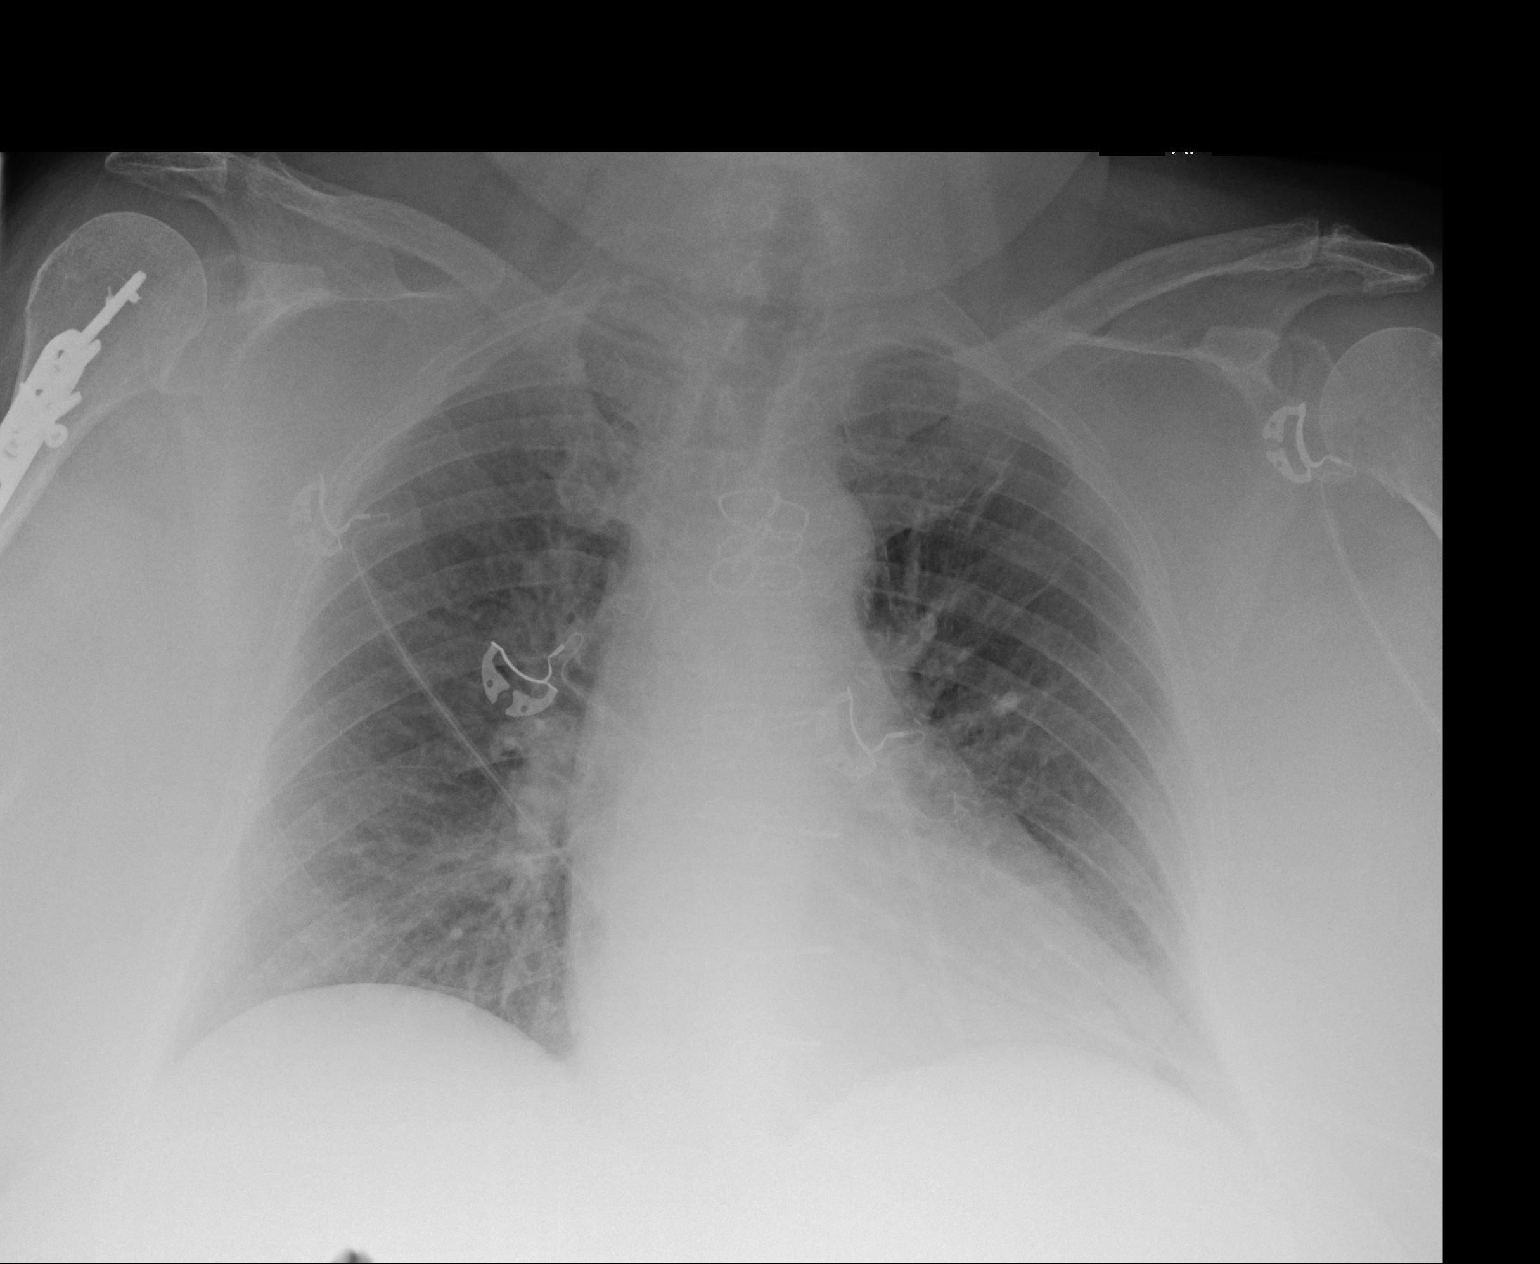

[1 of 1 positions shown; findings below may reference images not displayed]

FINDINGS: The cardiac silhouette is mildly enlarged. The patient is status
post median sternotomy and CABG. Mild diffuse bilateral interstitial
prominence is seen without pneumonic consolidation, CHF nor
effusion. Minimal scarring or atelectasis in the left upper lobe as
before. Plate and screw fixation of the visualized right humeral
neck and shaft with surgical anchors projecting over the right
humeral head. No acute fracture nor bone destruction.
IMPRESSION: Stable mild cardiomegaly without acute pulmonary disease.

## 2018-09-25 IMAGING — NM NM MYOCAR MULTI W/SPECT W/WALL MOTION & EF
2 series · 12 of 12 positions shown · non-contrast
Comparison: none

[Series 1: rest · 8.28mm/px · 6 of 64 frames shown]
[frame 6/64]
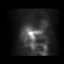
[frame 16/64]
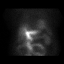
[frame 27/64]
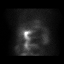
[frame 38/64]
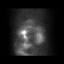
[frame 48/64]
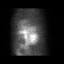
[frame 59/64]
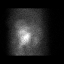

[Series 2: stress gated · 8.28mm/px · 6 of 64 frames shown]
[frame 6/64]
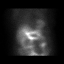
[frame 16/64]
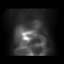
[frame 27/64]
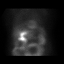
[frame 38/64]
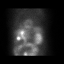
[frame 48/64]
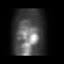
[frame 59/64]
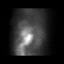

[12 of 12 positions shown; findings below may reference images not displayed]

Canned report from images found in remote index.

Refer to host system for actual result text.

## 2018-11-01 ENCOUNTER — Encounter (HOSPITAL_COMMUNITY): Payer: Self-pay | Admitting: Emergency Medicine

## 2018-11-01 ENCOUNTER — Emergency Department (HOSPITAL_COMMUNITY): Payer: Medicare HMO

## 2018-11-01 ENCOUNTER — Inpatient Hospital Stay (HOSPITAL_COMMUNITY)
Admission: EM | Admit: 2018-11-01 | Discharge: 2018-11-03 | DRG: 069 | Disposition: A | Discharge status: Home Health | Payer: Medicare HMO | Attending: Family Medicine | Admitting: Family Medicine

## 2018-11-01 ENCOUNTER — Other Ambulatory Visit: Payer: Self-pay

## 2018-11-01 DIAGNOSIS — E860 Dehydration: Secondary | ICD-10-CM | POA: Diagnosis not present

## 2018-11-01 DIAGNOSIS — I110 Hypertensive heart disease with heart failure: Secondary | ICD-10-CM | POA: Diagnosis present

## 2018-11-01 DIAGNOSIS — D689 Coagulation defect, unspecified: Secondary | ICD-10-CM | POA: Diagnosis not present

## 2018-11-01 DIAGNOSIS — J449 Chronic obstructive pulmonary disease, unspecified: Secondary | ICD-10-CM | POA: Diagnosis not present

## 2018-11-01 DIAGNOSIS — Z961 Presence of intraocular lens: Secondary | ICD-10-CM | POA: Diagnosis present

## 2018-11-01 DIAGNOSIS — M5136 Other intervertebral disc degeneration, lumbar region: Secondary | ICD-10-CM | POA: Diagnosis present

## 2018-11-01 DIAGNOSIS — Z90710 Acquired absence of both cervix and uterus: Secondary | ICD-10-CM

## 2018-11-01 DIAGNOSIS — I639 Cerebral infarction, unspecified: Secondary | ICD-10-CM

## 2018-11-01 DIAGNOSIS — Z951 Presence of aortocoronary bypass graft: Secondary | ICD-10-CM

## 2018-11-01 DIAGNOSIS — Z6833 Body mass index (BMI) 33.0-33.9, adult: Secondary | ICD-10-CM | POA: Diagnosis not present

## 2018-11-01 DIAGNOSIS — Z823 Family history of stroke: Secondary | ICD-10-CM

## 2018-11-01 DIAGNOSIS — R739 Hyperglycemia, unspecified: Secondary | ICD-10-CM | POA: Diagnosis not present

## 2018-11-01 DIAGNOSIS — R29810 Facial weakness: Secondary | ICD-10-CM | POA: Diagnosis present

## 2018-11-01 DIAGNOSIS — L409 Psoriasis, unspecified: Secondary | ICD-10-CM | POA: Diagnosis not present

## 2018-11-01 DIAGNOSIS — R4701 Aphasia: Secondary | ICD-10-CM | POA: Diagnosis not present

## 2018-11-01 DIAGNOSIS — E1142 Type 2 diabetes mellitus with diabetic polyneuropathy: Secondary | ICD-10-CM | POA: Diagnosis not present

## 2018-11-01 DIAGNOSIS — N179 Acute kidney failure, unspecified: Secondary | ICD-10-CM | POA: Diagnosis not present

## 2018-11-01 DIAGNOSIS — E039 Hypothyroidism, unspecified: Secondary | ICD-10-CM | POA: Diagnosis present

## 2018-11-01 DIAGNOSIS — Z9049 Acquired absence of other specified parts of digestive tract: Secondary | ICD-10-CM

## 2018-11-01 DIAGNOSIS — E1165 Type 2 diabetes mellitus with hyperglycemia: Secondary | ICD-10-CM | POA: Diagnosis present

## 2018-11-01 DIAGNOSIS — E662 Morbid (severe) obesity with alveolar hypoventilation: Secondary | ICD-10-CM | POA: Diagnosis not present

## 2018-11-01 DIAGNOSIS — M1990 Unspecified osteoarthritis, unspecified site: Secondary | ICD-10-CM | POA: Diagnosis not present

## 2018-11-01 DIAGNOSIS — I5032 Chronic diastolic (congestive) heart failure: Secondary | ICD-10-CM | POA: Diagnosis not present

## 2018-11-01 DIAGNOSIS — Z7952 Long term (current) use of systemic steroids: Secondary | ICD-10-CM

## 2018-11-01 DIAGNOSIS — G459 Transient cerebral ischemic attack, unspecified: Principal | ICD-10-CM | POA: Diagnosis present

## 2018-11-01 DIAGNOSIS — I252 Old myocardial infarction: Secondary | ICD-10-CM

## 2018-11-01 DIAGNOSIS — Z794 Long term (current) use of insulin: Secondary | ICD-10-CM

## 2018-11-01 DIAGNOSIS — E785 Hyperlipidemia, unspecified: Secondary | ICD-10-CM | POA: Diagnosis present

## 2018-11-01 DIAGNOSIS — E1151 Type 2 diabetes mellitus with diabetic peripheral angiopathy without gangrene: Secondary | ICD-10-CM | POA: Diagnosis not present

## 2018-11-01 DIAGNOSIS — Z8249 Family history of ischemic heart disease and other diseases of the circulatory system: Secondary | ICD-10-CM

## 2018-11-01 DIAGNOSIS — N393 Stress incontinence (female) (male): Secondary | ICD-10-CM | POA: Diagnosis present

## 2018-11-01 DIAGNOSIS — N390 Urinary tract infection, site not specified: Secondary | ICD-10-CM | POA: Diagnosis present

## 2018-11-01 DIAGNOSIS — Z955 Presence of coronary angioplasty implant and graft: Secondary | ICD-10-CM

## 2018-11-01 DIAGNOSIS — Z885 Allergy status to narcotic agent status: Secondary | ICD-10-CM

## 2018-11-01 DIAGNOSIS — Z9842 Cataract extraction status, left eye: Secondary | ICD-10-CM

## 2018-11-01 DIAGNOSIS — R29703 NIHSS score 3: Secondary | ICD-10-CM | POA: Diagnosis not present

## 2018-11-01 DIAGNOSIS — Z90722 Acquired absence of ovaries, bilateral: Secondary | ICD-10-CM

## 2018-11-01 DIAGNOSIS — I2510 Atherosclerotic heart disease of native coronary artery without angina pectoris: Secondary | ICD-10-CM | POA: Diagnosis not present

## 2018-11-01 DIAGNOSIS — Z7982 Long term (current) use of aspirin: Secondary | ICD-10-CM

## 2018-11-01 DIAGNOSIS — R471 Dysarthria and anarthria: Secondary | ICD-10-CM | POA: Diagnosis not present

## 2018-11-01 DIAGNOSIS — Z9119 Patient's noncompliance with other medical treatment and regimen: Secondary | ICD-10-CM

## 2018-11-01 DIAGNOSIS — G8929 Other chronic pain: Secondary | ICD-10-CM | POA: Diagnosis present

## 2018-11-01 DIAGNOSIS — Z79899 Other long term (current) drug therapy: Secondary | ICD-10-CM

## 2018-11-01 DIAGNOSIS — Z9114 Patient's other noncompliance with medication regimen: Secondary | ICD-10-CM

## 2018-11-01 DIAGNOSIS — Z9079 Acquired absence of other genital organ(s): Secondary | ICD-10-CM

## 2018-11-01 DIAGNOSIS — Z79890 Hormone replacement therapy (postmenopausal): Secondary | ICD-10-CM

## 2018-11-01 DIAGNOSIS — Z8673 Personal history of transient ischemic attack (TIA), and cerebral infarction without residual deficits: Secondary | ICD-10-CM

## 2018-11-01 DIAGNOSIS — Z87440 Personal history of urinary (tract) infections: Secondary | ICD-10-CM

## 2018-11-01 DIAGNOSIS — Z9841 Cataract extraction status, right eye: Secondary | ICD-10-CM

## 2018-11-01 LAB — I-STAT CHEM 8, ED
BUN: 10 mg/dL (ref 8–23)
CREATININE: 1.1 mg/dL — AB (ref 0.44–1.00)
Calcium, Ion: 1.19 mmol/L (ref 1.15–1.40)
Chloride: 101 mmol/L (ref 98–111)
Glucose, Bld: 243 mg/dL — ABNORMAL HIGH (ref 70–99)
HEMATOCRIT: 43 % (ref 36.0–46.0)
Hemoglobin: 14.6 g/dL (ref 12.0–15.0)
Potassium: 3.9 mmol/L (ref 3.5–5.1)
Sodium: 138 mmol/L (ref 135–145)
TCO2: 27 mmol/L (ref 22–32)

## 2018-11-01 LAB — DIFFERENTIAL
ABS IMMATURE GRANULOCYTES: 0.04 10*3/uL (ref 0.00–0.07)
BASOS ABS: 0 10*3/uL (ref 0.0–0.1)
BASOS PCT: 0 %
EOS ABS: 0.4 10*3/uL (ref 0.0–0.5)
Eosinophils Relative: 4 %
IMMATURE GRANULOCYTES: 0 %
Lymphocytes Relative: 27 %
Lymphs Abs: 2.5 10*3/uL (ref 0.7–4.0)
MONOS PCT: 10 %
Monocytes Absolute: 0.9 10*3/uL (ref 0.1–1.0)
NEUTROS PCT: 59 %
Neutro Abs: 5.5 10*3/uL (ref 1.7–7.7)

## 2018-11-01 LAB — COMPREHENSIVE METABOLIC PANEL
ALBUMIN: 4 g/dL (ref 3.5–5.0)
ALT: 15 U/L (ref 0–44)
AST: 17 U/L (ref 15–41)
Alkaline Phosphatase: 70 U/L (ref 38–126)
Anion gap: 9 (ref 5–15)
BUN: 11 mg/dL (ref 8–23)
CHLORIDE: 102 mmol/L (ref 98–111)
CO2: 25 mmol/L (ref 22–32)
Calcium: 9.5 mg/dL (ref 8.9–10.3)
Creatinine, Ser: 1.03 mg/dL — ABNORMAL HIGH (ref 0.44–1.00)
GFR calc Af Amer: 58 mL/min — ABNORMAL LOW (ref >60)
GFR, EST NON AFRICAN AMERICAN: 50 mL/min — AB (ref >60)
Glucose, Bld: 242 mg/dL — ABNORMAL HIGH (ref 70–99)
POTASSIUM: 3.8 mmol/L (ref 3.5–5.1)
SODIUM: 136 mmol/L (ref 135–145)
Total Bilirubin: 0.7 mg/dL (ref 0.3–1.2)
Total Protein: 7.4 g/dL (ref 6.5–8.1)

## 2018-11-01 LAB — URINALYSIS, ROUTINE W REFLEX MICROSCOPIC
BILIRUBIN URINE: NEGATIVE
Glucose, UA: >=500 mg/dL — AB
HGB URINE DIPSTICK: NEGATIVE
Ketones, ur: NEGATIVE mg/dL
Nitrite: POSITIVE — AB
PROTEIN: NEGATIVE mg/dL
Specific Gravity, Urine: 1.012 (ref 1.005–1.030)
WBC, UA: >50 WBC/hpf — ABNORMAL HIGH (ref 0–5)
pH: 5 (ref 5.0–8.0)

## 2018-11-01 LAB — CBC
HCT: 43.8 % (ref 36.0–46.0)
Hemoglobin: 12.9 g/dL (ref 12.0–15.0)
MCH: 23.9 pg — ABNORMAL LOW (ref 26.0–34.0)
MCHC: 29.5 g/dL — ABNORMAL LOW (ref 30.0–36.0)
MCV: 81.3 fL (ref 80.0–100.0)
PLATELETS: 331 10*3/uL (ref 150–400)
RBC: 5.39 MIL/uL — AB (ref 3.87–5.11)
RDW: 16.5 % — ABNORMAL HIGH (ref 11.5–15.5)
WBC: 9.3 10*3/uL (ref 4.0–10.5)
nRBC: 0 % (ref 0.0–0.2)

## 2018-11-01 LAB — ETHANOL: Alcohol, Ethyl (B): <10 mg/dL (ref <10)

## 2018-11-01 LAB — I-STAT TROPONIN, ED: TROPONIN I, POC: 0 ng/mL (ref 0.00–0.08)

## 2018-11-01 LAB — PROTIME-INR
INR: 0.96
Prothrombin Time: 12.7 seconds (ref 11.4–15.2)

## 2018-11-01 LAB — RAPID URINE DRUG SCREEN, HOSP PERFORMED
Amphetamines: NOT DETECTED
BARBITURATES: NOT DETECTED
BENZODIAZEPINES: NOT DETECTED
COCAINE: NOT DETECTED
Opiates: NOT DETECTED
Tetrahydrocannabinol: NOT DETECTED

## 2018-11-01 LAB — APTT: APTT: 30 s (ref 24–36)

## 2018-11-01 LAB — CBG MONITORING, ED: GLUCOSE-CAPILLARY: 236 mg/dL — AB (ref 70–99)

## 2018-11-01 MED ORDER — STROKE: EARLY STAGES OF RECOVERY BOOK
Freq: Once | Status: DC
  Filled 2018-11-01 (×2): qty 1

## 2018-11-01 MED ORDER — ACETAMINOPHEN 650 MG RE SUPP: 650.0000 mg | RECTAL | Status: DC | PRN

## 2018-11-01 MED ORDER — ACETAMINOPHEN 325 MG PO TABS
650.0000 mg | ORAL_TABLET | ORAL | Status: DC | PRN
  Administered 2018-11-01 – 2018-11-03 (×5): 650 mg via ORAL
  Filled 2018-11-01 (×5): qty 2

## 2018-11-01 MED ORDER — ACETAMINOPHEN 160 MG/5ML PO SOLN: 650.0000 mg | ORAL | Status: DC | PRN

## 2018-11-01 MED ORDER — SODIUM CHLORIDE 0.9 % IV SOLN
INTRAVENOUS | Status: DC
  Administered 2018-11-02 – 2018-11-03 (×3): via INTRAVENOUS

## 2018-11-01 MED ORDER — ENOXAPARIN SODIUM 40 MG/0.4ML ~~LOC~~ SOLN
40.0000 mg | SUBCUTANEOUS | Status: DC
  Administered 2018-11-02: 40 mg via SUBCUTANEOUS
  Filled 2018-11-01: qty 0.4

## 2018-11-01 MED ORDER — BACITRACIN-NEOMYCIN-POLYMYXIN 400-5-5000 EX OINT
TOPICAL_OINTMENT | Freq: Once | CUTANEOUS | Status: AC
  Administered 2018-11-01: 1 via TOPICAL
  Filled 2018-11-01: qty 1

## 2018-11-01 NOTE — ED Notes (Signed)
Tele neuro machine brought to room. Tele neuro RN responded. Pt in CT.

## 2018-11-01 NOTE — Progress Notes (Signed)
CODE STROKE 1512 CALL TIME 1513 BEEPER  1520 EXAM STARTED 1525 EXAM FINISHED, IMAGES SENT TO SOC,EXAM COMPLETED IN Epic 1526 CALLED GR, SPOKE WITH TROY/ROY.

## 2018-11-01 NOTE — ED Triage Notes (Signed)
Pt's grandson reported that around 1300 pt began having difficulty speaking, weakness, LT sided facial droop, and balance difficulties. States that she has been having balance difficulties for over a week, but it became remarkably different at 1300. Code Stroke called. Cleared for CT by EDP at 1515.

## 2018-11-01 NOTE — H&P (Addendum)
History and Physical    [name] [name] ZOX:[id number] DOB: 07-10-1936 DOA: 11/01/2018  Referring MD/NP/PA: Deretha Emory  PCP: Oval Linsey, MD  Outpatient Specialists: None   Patient coming from: Home   Chief Complaint: CVA  HPI: [name] [name] is a 82 y.o. female with medical history significant of multiple medical issues including coronary artery disease, diastolic CHF, essential hypertension as well as history of CVA presenting with CVA.  Patient reports progressive onset of right-sided facial droop and weakness since about 1 PM today the patient reports this is been going on for the past 3 weeks.  Noted history of CVA in the past.  Patient reports that she has been noncompliant with aspirin use on a regular basis.  Denies any chest pains or shortness of breath.  Has been having difficulty ambulating because of the right-sided weakness.  Also with intermittent aphasia.  No fevers or chills.   ED Course: Presented to the ER afebrile, hemodynamically stable.  Per Dr. Deretha Emory, a code stroke was initially run however this was later held as patient symptoms have been going on for the past 3weeks.  CBC grossly within normal limits.  Glucose 242.  Creatinine 1.03.  CT head with atrophy and small vessel disease.  No acute findings.  NIH score 3.  EKG sinus rhythm.  Review of Systems: As per HPI otherwise 10 point review of systems negative.    Past Medical History:  Diagnosis Date  . Arthritis   . CAD (coronary artery disease)    Multivessel status post CABG 2001  . Chronic obstructive pulmonary disease (HCC)   . DDD (degenerative disc disease), lumbar   . Diastolic CHF (HCC)   . Essential hypertension   . Hematuria   . History of CVA (cerebrovascular accident)    2013 - lacunar infarct right thalamus - residual left side of  lip numb  and left eye vision worse (which corrented lens implant from cataract extraction)  . History of MI (myocardial infarction)    July 2001  .  Hyperlipidemia   . Hypothyroidism   . Insulin dependent type 2 diabetes mellitus (HCC)   . Lesion of bladder   . Lower urinary tract symptoms (LUTS)   . OSA (obstructive sleep apnea)   . Peripheral neuropathy   . Psoriasis   . SUI (stress urinary incontinence, female)   . Varicose veins     Past Surgical History:  Procedure Laterality Date  . APPENDECTOMY  age 83  . CARDIAC CATHETERIZATION  1991   No sig. cad  . CARDIAC CATHETERIZATION  07-01-2000   high grade in-stent restenosis  . CARDIAC CATHETERIZATION  04-30-2001   single native vessel cad/ graft patent  . CARDIAC CATHETERIZATION  02-13-2006  dr [place] cad with total occluded LAD  and  70% ostial of small ramus/  grafts patent/  normal ef  . CARDIAC CATHETERIZATION  01-20-2000  dr Clifton James   essentially unchanged;  severe single vessel cad with diffuse disease throughout CFX and RCA/ ef 66%;  chf with preserved LVSF  . CARDIOVASCULAR STRESS TEST  08-28-2011   DR ROTHBART   NEGATIVE NUCLEAR STUDY/  GLOBAL LVSF/  EF 65%  . CARPAL TUNNEL RELEASE Bilateral yrs ago  . CATARACT EXTRACTION W/ INTRAOCULAR LENS  IMPLANT, BILATERAL  2013  . CHOLECYSTECTOMY  1990  . COLONOSCOPY N/A 07/26/2015   Procedure: COLONOSCOPY;  Surgeon: Malissa Hippo, MD;  Location: [facility];  Service: Endoscopy;  Laterality: N/A;  1200  .  CORONARY ANGIOPLASTY  11-13-1999   balloon angioplasty to mLAD , d2 of LAD  . CORONARY ANGIOPLASTY WITH STENT PLACEMENT  01-17-2000   PCI stenting to mLAD & D2 of LAD  . CORONARY ARTERY BYPASS GRAFT  07-02-2000   DR Tressie Stalker   SVG to diagonal, LIMA to LAD  . CYSTOSCOPY W/ RETROGRADES Bilateral 08/09/2014   Procedure: CYSTOSCOPY, BILATERAL RETROGRADE, HYDRODISTENSION, BLADDER BIOPSY WITH FULGERATION, INSTILL PYRIDIUM AND MARCAINE ;  Surgeon: Anner Crete, MD;  Location: [facility];  Service: Urology;  Laterality: Bilateral;  . KNEE ARTHROSCOPY Left 10-18-2003   SYNOVECTOMY  . LUMBAR SPINE  SURGERY  x2    yrs ago  . ORIF HUMERUS FRACTURE Right 04-18-2008  . REPAIR FLEXOR TENDON HAND Right 09-20-2008   CARPI RADIALIS TENDON TRANSFER TO EXTENSOR TO INDEX, MIDDLE, RING , LITTLE FINGERS AND DIGITI MININI  . ROTATOR CUFF REPAIR Right 1995  . TOTAL ABDOMINAL HYSTERECTOMY W/ BILATERAL SALPINGOOPHORECTOMY  age 50  . TRANSTHORACIC ECHOCARDIOGRAM  12-12-2013   MILD LVH/  EF 60-65%/  GRADE I DIASTOLIC DYSFUNCTION/  MILD LAE     reports that she has never smoked. She has never used smokeless tobacco. She reports that she does not drink alcohol or use drugs.  Allergies  Allergen Reactions  . Codeine Itching    Family History  Problem Relation Age of Onset  . Hypertension Mother   . Stroke Mother   . Coronary artery disease Unknown        Multiple first and second-degree relatives  . Aneurysm Unknown        Cerebral circulation  . Colon cancer Sister   . Stroke Sister   . Coronary artery disease Sister   . Clotting disorder Unknown        Children diagnosed with hypercoagulable state  . Coronary artery disease Brother      Prior to Admission medications   Medication Sig Start Date End Date Taking? Authorizing Provider  amLODipine (NORVASC) 5 MG tablet Take 5 mg by mouth daily. 07/03/17  Yes [provider]  aspirin EC 325 MG tablet Take 325 mg by mouth daily.   Yes [provider]  citalopram (CELEXA) 20 MG tablet Take 1 tablet (20 mg total) by mouth daily. 09/21/18  Yes [name], Gerlene Burdock, MD  gabapentin (NEURONTIN) 300 MG capsule Take 300 mg by mouth 2 (two) times daily. 09/18/17  Yes [provider]  insulin NPH-regular Human (NOVOLIN 70/30) (70-30) 100 UNIT/ML injection Inject 40 Units into the skin 2 (two) times daily.   Yes [provider]  ipratropium-albuterol (DUONEB) 0.5-2.5 (3) MG/3ML SOLN Inhale 3 mLs into the lungs 4 (four) times daily as needed (for shortness of breath).  09/19/17  Yes [provider]  levothyroxine  (SYNTHROID, LEVOTHROID) 25 MCG tablet Take 25 mcg by mouth daily.    Yes [provider]  lisinopril (PRINIVIL,ZESTRIL) 10 MG tablet Take 10 mg by mouth daily.   Yes [provider]  LORazepam (ATIVAN) 1 MG tablet Take 1 tablet (1 mg total) by mouth at bedtime. 09/20/18  Yes Oval Linsey, MD  metFORMIN (GLUCOPHAGE) 500 MG tablet Take 500 mg by mouth 2 (two) times daily with a meal.     Yes [provider]  nitroGLYCERIN (NITROSTAT) 0.4 MG SL tablet Place 0.4 mg under the tongue every 5 (five) minutes as needed for chest pain.    Yes [provider]  oxyCODONE (OXY IR/ROXICODONE) 5 MG immediate release tablet Take 1 tablet (  5 mg total) by mouth every 4 (four) hours as needed (Moderate pain). 06/12/18  Yes Oval Linsey, MD  predniSONE (DELTASONE) 20 MG tablet Take 20 mg by mouth 2 (two) times daily with a meal.  09/08/18  Yes [provider]  triamcinolone cream (KENALOG) 0.1 % Apply 1 application topically 2 (two) times daily as needed (for flares).  05/04/18  Yes [provider]  Dextromethorphan-guaiFENesin (MUCUS RELIEF DM) 30-600 MG TB12 Take 1 tablet by mouth every 12 (twelve) hours. Patient not taking: Reported on 11/01/2018 09/20/18   Oval Linsey, MD  docusate sodium (COLACE) 100 MG capsule Take 1 capsule (100 mg total) by mouth daily. Patient not taking: Reported on 11/01/2018 09/21/18   Oval Linsey, MD  sulfamethoxazole-trimethoprim (BACTRIM DS,SEPTRA DS) 800-160 MG tablet Take 1 tablet by mouth daily. Patient not taking: Reported on 11/01/2018 09/20/18   Oval Linsey, MD    Physical Exam: Vitals:   11/01/18 1630 11/01/18 1700 11/01/18 1715 11/01/18 1730  BP: 119/66 (!) 121/94 104/76 135/65  Pulse: 77 70 74 71  Resp: 13 18 19 19   Temp:      TempSrc:      SpO2: 92% 95% 95% 96%  Weight:      Height:          Constitutional: NAD, calm, comfortable Vitals:   11/01/18 1630 11/01/18 1700 11/01/18 1715  11/01/18 1730  BP: 119/66 (!) 121/94 104/76 135/65  Pulse: 77 70 74 71  Resp: 13 18 19 19   Temp:      TempSrc:      SpO2: 92% 95% 95% 96%  Weight:      Height:       Eyes: PERRL, lids and conjunctivae normal ENMT: R sided facial droop. Mucous membranes are moist. Posterior pharynx clear of any exudate or lesions.Normal dentition.  Neck: normal, supple, no masses, no thyromegaly Respiratory: clear to auscultation bilaterally, no wheezing, no crackles. Normal respiratory effort. No accessory muscle use.  Cardiovascular: Regular rate and rhythm, no murmurs / rubs / gallops. No extremity edema. 2+ pedal pulses. No carotid bruits.  Abdomen: no tenderness, no masses palpated. No hepatosplenomegaly. Bowel sounds positive.  Musculoskeletal: no clubbing / cyanosis. No joint deformity upper and lower extremities. Good ROM, no contractures. Normal muscle tone.  Skin: no rashes, lesions, ulcers. No induration Neurologic: R sided facial droop, + R sided weakness in upper and lower extremities.  Psychiatric: Normal judgment and insight. Alert and oriented x 3. Normal mood.    Labs on Admission: I have personally reviewed following labs and imaging studies  CBC: Recent Labs  Lab 11/01/18 1519 11/01/18 1528  WBC 9.3  --   NEUTROABS 5.5  --   HGB 12.9 14.6  HCT 43.8 43.0  MCV 81.3  --   PLT 331  --    Basic Metabolic Panel: Recent Labs  Lab 11/01/18 1519 11/01/18 1528  NA 136 138  K 3.8 3.9  CL 102 101  CO2 25  --   GLUCOSE 242* 243*  BUN 11 10  CREATININE 1.03* 1.10*  CALCIUM 9.5  --    GFR: Estimated Creatinine Clearance: 44.7 mL/min (A) (by C-G formula based on SCr of 1.1 mg/dL (H)). Liver Function Tests: Recent Labs  Lab 11/01/18 1519  AST 17  ALT 15  ALKPHOS 70  BILITOT 0.7  PROT 7.4  ALBUMIN 4.0   No results for input(s): LIPASE, AMYLASE in the last 168 hours. No results for input(s): AMMONIA in the last 168 hours.  Coagulation Profile: Recent Labs  Lab  11/01/18 1519  INR 0.96   Cardiac Enzymes: No results for input(s): CKTOTAL, CKMB, CKMBINDEX, TROPONINI in the last 168 hours. BNP (last 3 results) No results for input(s): PROBNP in the last 8760 hours. HbA1C: No results for input(s): HGBA1C in the last 72 hours. CBG: Recent Labs  Lab 11/01/18 1529  GLUCAP 236*   Lipid Profile: No results for input(s): CHOL, HDL, LDLCALC, TRIG, CHOLHDL, LDLDIRECT in the last 72 hours. Thyroid Function Tests: No results for input(s): TSH, T4TOTAL, FREET4, T3FREE, THYROIDAB in the last 72 hours. Anemia Panel: No results for input(s): VITAMINB12, FOLATE, FERRITIN, TIBC, IRON, RETICCTPCT in the last 72 hours. Urine analysis:    Component Value Date/Time   COLORURINE YELLOW 08/27/2018 2359   APPEARANCEUR HAZY (A) 08/27/2018 2359   LABSPEC >1.046 (H) 08/27/2018 2359   PHURINE 6.0 08/27/2018 2359   GLUCOSEU >=500 (A) 08/27/2018 2359   HGBUR NEGATIVE 08/27/2018 2359   BILIRUBINUR NEGATIVE 08/27/2018 2359   KETONESUR NEGATIVE 08/27/2018 2359   PROTEINUR NEGATIVE 08/27/2018 2359   UROBILINOGEN 0.2 08/29/2015 1842   NITRITE POSITIVE (A) 08/27/2018 2359   LEUKOCYTESUR LARGE (A) 08/27/2018 2359   Sepsis Labs: @LABRCNTIP (procalcitonin:4,lacticidven:4) )No results found for this or any previous visit (from the past 240 hour(s)).   Radiological Exams on Admission: Ct Head Code Stroke Wo Contrast  Result Date: 11/01/2018 CLINICAL DATA:  Code stroke. Lost ability to speak at 1 p.m. Balance difficulty today. EXAM: CT HEAD WITHOUT CONTRAST TECHNIQUE: Contiguous axial images were obtained from the base of the skull through the vertex without intravenous contrast. COMPARISON:  08/28/2018 brain MRI. 08/27/2018 CT head and CTA head neck. FINDINGS: Brain: No evidence for acute infarction, hemorrhage, mass lesion, hydrocephalus, or extra-axial fluid. Generalized atrophy. Hypoattenuation of white matter, consistent with small vessel disease. Vascular:  Calcification of the cavernous internal carotid arteries consistent with cerebrovascular atherosclerotic disease. No signs of intracranial large vessel occlusion. Skull: Intact Sinuses/Orbits: No acute finding. Other: None ASPECTS ([place] Stroke Program Early CT Score) - Ganglionic level infarction (caudate, lentiform nuclei, internal capsule, insula, M1-M3 cortex): 7 - Supraganglionic infarction (M4-M6 cortex): 3 Total score (0-10 with 10 being normal): 10 IMPRESSION: 1. Atrophy and small vessel disease similar to priors. No acute intracranial findings. 2. ASPECTS is 10 3. These results were called by telephone at the time of interpretation on 11/01/2018 at 3:34 pm to Dr. Vanetta Mulders via Dr. Samuel Jester who verbally acknowledged these results. Electronically Signed   By: Elsie Stain M.D.   On: 11/01/2018 15:39    EKG: Independently reviewed. NSR  Assessment/Plan Active Problems:   CVA (cerebral vascular accident) (HCC)    1-CVA -Recurrent issue -We will place on full dose aspirin as patient has been noncompliant -CT head with no acute findings -MRI, MRA, 2D echo, carotid Dopplers, re-stratification labs -Plan for formal neurological consultation  2-coronary artery disease -Noted multivessel disease in the past status post CABG in 2001 -No active chest pain -Troponin negative x1 -EKG normal sinus rhythm -Cont  home regimen  3-diastolic CHF -Appears euvolemic -Continue home regimen -2D echo in the setting of above  4-DM -SSI -A1c  DVT prophylaxis: SQH  Code Status: Full Code   Family Communication: Grandson at bedside   Disposition Plan: Pending further evaluation   Consults called: TeleNeuro for code stroke  Admission status: Obs    Floydene Flock MD [facility] Pager [phone]  If 7PM-7AM, please contact night-coverage [email or web address] Password [facility]  11/01/2018, 6:04  PM

## 2018-11-01 NOTE — ED Notes (Signed)
Dr Alvester Morin advised that pt is being transferred to [place],

## 2018-11-01 NOTE — ED Notes (Signed)
EDP exam at this time in the hallway.

## 2018-11-01 NOTE — ED Notes (Signed)
After completion of Teleneuro consult. It was decided that pt was out of the window for TPA administration. PT has had difficulty walking over the past week and generalized weakness.

## 2018-11-01 NOTE — ED Provider Notes (Signed)
[facility] EMERGENCY DEPARTMENT Provider Note   CSN: [id number] Arrival date & time: 11/01/18  1505   An emergency department physician performed an initial assessment on this suspected stroke patient at 62.  History   Chief Complaint Chief Complaint  Patient presents with  . Code Stroke    HPI [name] [name] is a 82 y.o. female.  Patient brought in by her son.  Mostly patient was some difficulty walking may be due to being off balance for about a week.  But today at around 1:00 PM sudden onset of inability to speak and right facial droop.  Based on this patient met criteria to go to trigger code stroke.  Was not clear about what really been going on during the week.  Patient also stated that to move her arms and legs she had to think real hard to make that happen.  Apparently her speech was kind of waxing and waning some.  When she arrived able to speak minimally.  But after head CT patient was speaking better.  Patient was evaluated by me basically at the [facility] and and code stroke was initiated.  NIH score after evaluation seem to be about 3.  This was upon return from head CT.  Past records show that in September patient was admitted for possible TIA CVA underwent work-up CT head that was negative but CT angios showed sort of multiple areas of some brain atherosclerotic disease but nothing significant but certainly could explain some of the symptoms.  Patient also had MRI at that time that was negative.  Patient has diabetes which is somewhat questionably controlled.  Obviously showed evidence of probable peripheral vascular disease.  So was medically treated to try to control that.  In addition patient did complain of headache.     Past Medical History:  Diagnosis Date  . Arthritis   . CAD (coronary artery disease)    Multivessel status post CABG 2001  . Chronic obstructive pulmonary disease ([place])   . DDD (degenerative disc disease), lumbar   . Diastolic CHF ([place])   .  Essential hypertension   . Hematuria   . History of CVA (cerebrovascular accident)    2013 - lacunar infarct right thalamus - residual left side of  lip numb  and left eye vision worse (which corrented lens implant from cataract extraction)  . History of MI (myocardial infarction)    July 2001  . Hyperlipidemia   . Hypothyroidism   . Insulin dependent type 2 diabetes mellitus ([place])   . Lesion of bladder   . Lower urinary tract symptoms (LUTS)   . OSA (obstructive sleep apnea)   . Peripheral neuropathy   . Psoriasis   . SUI (stress urinary incontinence, female)   . Varicose veins     Patient Active Problem List   Diagnosis Date Noted  . Syncope   . Loss of consciousness ([place]) 06/10/2018  . Hyperglycemia 06/10/2018  . Bradycardia, sinus 06/10/2018  . Diastolic dysfunction 62/83/1517  . CAD (coronary artery disease) 09/28/2017  . Acute lower UTI 09/28/2017  . Ischemic stroke ([place]) 05/10/2017  . Acute ischemic stroke ([place]) 05/10/2017  . Stroke ([place]) 08/01/2016  . Dysarthria 03/18/2016  . Anemia of chronic disease 11/19/2015  . OSA (obstructive sleep apnea) 11/19/2015  . Left facial numbness 08/29/2015  . Gait instability 06/26/2015  . COPD exacerbation ([place]) 11/13/2014  . Weakness generalized 12/10/2013  . TIA (transient ischemic attack) 06/22/2012  . Left-sided headache 06/22/2012  . Noncompliance 06/22/2012  .  Noncompliance with CPAP treatment 06/22/2012  . Psoriasis 01/07/2012  . Cerebrovascular disease   . Chronic back pain   . Chronic obstructive pulmonary disease ([place])   . DM type 2 causing complication ([place]) 56/21/3086  . Obesity 02/14/2010  . Arteriosclerotic cardiovascular disease (ASCVD) 02/14/2010  . Hypothyroidism 02/06/2010  . Hyperlipidemia 02/06/2010  . Essential hypertension 02/06/2010  . Sleep apnea 02/06/2010    Past Surgical History:  Procedure Laterality Date  . APPENDECTOMY  age 72  . CARDIAC CATHETERIZATION  1991   No sig. cad  . CARDIAC  CATHETERIZATION  07-01-2000   high grade in-stent restenosis  . CARDIAC CATHETERIZATION  04-30-2001   single native vessel cad/ graft patent  . CARDIAC CATHETERIZATION  02-13-2006  dr Danae Orleans cad with total occluded LAD  and  70% ostial of small ramus/  grafts patent/  normal ef  . CARDIAC CATHETERIZATION  01-20-2000  dr Angelena Form   essentially unchanged;  severe single vessel cad with diffuse disease throughout CFX and RCA/ ef 66%;  chf with preserved LVSF  . CARDIOVASCULAR STRESS TEST  08-28-2011   DR ROTHBART   NEGATIVE NUCLEAR STUDY/  GLOBAL LVSF/  EF 65%  . CARPAL TUNNEL RELEASE Bilateral yrs ago  . CATARACT EXTRACTION W/ INTRAOCULAR LENS  IMPLANT, BILATERAL  2013  . CHOLECYSTECTOMY  1990  . COLONOSCOPY N/A 07/26/2015   Procedure: COLONOSCOPY;  Surgeon: Rogene Houston, MD;  Location: [facility];  Service: Endoscopy;  Laterality: N/A;  1200  . CORONARY ANGIOPLASTY  11-13-1999   balloon angioplasty to mLAD , d2 of LAD  . CORONARY ANGIOPLASTY WITH STENT PLACEMENT  01-17-2000   PCI stenting to mLAD & D2 of LAD  . CORONARY ARTERY BYPASS GRAFT  07-02-2000   DR Darylene Price   SVG to diagonal, LIMA to LAD  . CYSTOSCOPY W/ RETROGRADES Bilateral 08/09/2014   Procedure: CYSTOSCOPY, BILATERAL RETROGRADE, HYDRODISTENSION, BLADDER BIOPSY WITH FULGERATION, INSTILL PYRIDIUM AND MARCAINE ;  Surgeon: Malka So, MD;  Location: [facility];  Service: Urology;  Laterality: Bilateral;  . KNEE ARTHROSCOPY Left 10-18-2003   SYNOVECTOMY  . LUMBAR SPINE SURGERY  x2    yrs ago  . ORIF HUMERUS FRACTURE Right 04-18-2008  . REPAIR FLEXOR TENDON HAND Right 09-20-2008   CARPI RADIALIS TENDON TRANSFER TO EXTENSOR TO INDEX, MIDDLE, RING , LITTLE FINGERS AND DIGITI MININI  . ROTATOR CUFF REPAIR Right 1995  . TOTAL ABDOMINAL HYSTERECTOMY W/ BILATERAL SALPINGOOPHORECTOMY  age 23  . TRANSTHORACIC ECHOCARDIOGRAM  12-12-2013   MILD LVH/  EF 57-84%/  GRADE I DIASTOLIC DYSFUNCTION/  MILD LAE      OB History    Gravida  7   Para  7   Term  7   Preterm      AB      Living  4     SAB      TAB      Ectopic      Multiple      Live Births               Home Medications    Prior to Admission medications   Medication Sig Start Date End Date Taking? Authorizing Provider  amLODipine (NORVASC) 5 MG tablet Take 5 mg by mouth daily. 07/03/17   [provider]  aspirin EC 325 MG tablet Take 325 mg by mouth daily.    [provider]  citalopram (CELEXA) 20 MG tablet Take 1 tablet (20 mg total)  by mouth daily. 09/21/18   Lucia Gaskins, MD  Dextromethorphan-guaiFENesin (MUCUS RELIEF DM) 30-600 MG TB12 Take 1 tablet by mouth every 12 (twelve) hours. 09/20/18   [name], Richard, MD  docusate sodium (COLACE) 100 MG capsule Take 1 capsule (100 mg total) by mouth daily. 09/21/18   Lucia Gaskins, MD  gabapentin (NEURONTIN) 300 MG capsule Take 300 mg by mouth 2 (two) times daily. 09/18/17   [provider]  insulin NPH-regular Human (NOVOLIN 70/30) (70-30) 100 UNIT/ML injection Inject 40 Units into the skin 2 (two) times daily.    [provider]  ipratropium-albuterol (DUONEB) 0.5-2.5 (3) MG/3ML SOLN Inhale 3 mLs into the lungs 4 (four) times daily as needed. 09/19/17   [provider]  levothyroxine (SYNTHROID, LEVOTHROID) 25 MCG tablet Take 25 mcg by mouth daily.     [provider]  lisinopril (PRINIVIL,ZESTRIL) 10 MG tablet Take 10 mg by mouth daily.    [provider]  LORazepam (ATIVAN) 1 MG tablet Take 1 tablet (1 mg total) by mouth at bedtime. 09/20/18   Lucia Gaskins, MD  metFORMIN (GLUCOPHAGE) 500 MG tablet Take 500 mg by mouth 2 (two) times daily with a meal.      [provider]  nitroGLYCERIN (NITROSTAT) 0.4 MG SL tablet Place 0.4 mg under the tongue every 5 (five) minutes as needed for chest pain.     [provider]  oxyCODONE (OXY IR/ROXICODONE) 5 MG immediate release tablet  Take 1 tablet (5 mg total) by mouth every 4 (four) hours as needed (Moderate pain). 06/12/18   Lucia Gaskins, MD  predniSONE (DELTASONE) 20 MG tablet TAKE 3 TABS X 7 DAYS, THEN 2 TABS X 7 DAYS, THEN 1 TAB X 7 DAYS 09/08/18   [provider]  sulfamethoxazole-trimethoprim (BACTRIM DS,SEPTRA DS) 800-160 MG tablet Take 1 tablet by mouth daily. 09/20/18   Lucia Gaskins, MD  triamcinolone cream (KENALOG) 0.1 % APPLY TO AFFECTED AREA TWICE A DAY AS DIRECTED FOR FLARES 05/04/18   [provider]    Family History Family History  Problem Relation Age of Onset  . Hypertension Mother   . Stroke Mother   . Coronary artery disease Unknown        Multiple first and second-degree relatives  . Aneurysm Unknown        Cerebral circulation  . Colon cancer Sister   . Stroke Sister   . Coronary artery disease Sister   . Clotting disorder Unknown        Children diagnosed with hypercoagulable state  . Coronary artery disease Brother     Social History Social History   Tobacco Use  . Smoking status: Never Smoker  . Smokeless tobacco: Never Used  Substance Use Topics  . Alcohol use: No  . Drug use: No     Allergies   Codeine   Review of Systems Review of Systems  Constitutional: Negative for fever.  HENT: Negative for congestion.   Eyes: Negative for visual disturbance.  Respiratory: Negative for shortness of breath.   Cardiovascular: Negative for chest pain.  Gastrointestinal: Negative for abdominal pain.  Genitourinary: Negative for dysuria.  Musculoskeletal: Negative for back pain and neck pain.  Skin: Negative for rash.  Neurological: Positive for facial asymmetry, speech difficulty, weakness, numbness and headaches.  Hematological: Does not bruise/bleed easily.  Psychiatric/Behavioral: Negative for confusion.     Physical Exam Updated Vital Signs BP 116/60   Pulse 71   Temp 98.1 F (36.7 C) (Oral)   Resp  15   Ht 1.651 m ('5\' 5"'$ )   Wt 88 kg   SpO2  95%   BMI 32.28 kg/m   Physical Exam  Constitutional: She is oriented to person, place, and time. She appears well-developed and well-nourished. No distress.  HENT:  Head: Normocephalic and atraumatic.  Mouth/Throat: Oropharynx is clear and moist.  Eyes: Pupils are equal, round, and reactive to light. Conjunctivae and EOM are normal.  Neck: Normal range of motion. Neck supple.  Cardiovascular: Normal rate, regular rhythm and normal heart sounds.  Pulmonary/Chest: Effort normal and breath sounds normal.  Abdominal: Soft. Bowel sounds are normal. There is no tenderness.  Musculoskeletal: Normal range of motion.  Neurological: She is alert and oriented to person, place, and time. A cranial nerve deficit and sensory deficit is present. She exhibits normal muscle tone.  Patient with right-sided facial droop and some sensory deficit on the right side.  Patient's speech would wax and wane a little bit patient is total NIH probably of 3 based on these findings.  No obvious motor weakness.  Did note that her balance seem to be off when she stood up as well.  Skin: Skin is warm.  Nursing note and vitals reviewed.    ED Treatments / Results  Labs (all labs ordered are listed, but only abnormal results are displayed) Labs Reviewed  CBC - Abnormal; Notable for the following components:      Result Value   RBC 5.39 (*)    MCH 23.9 (*)    MCHC 29.5 (*)    RDW 16.5 (*)    All other components within normal limits  COMPREHENSIVE METABOLIC PANEL - Abnormal; Notable for the following components:   Glucose, Bld 242 (*)    Creatinine, Ser 1.03 (*)    GFR calc non Af Amer 50 (*)    GFR calc Af Amer 58 (*)    All other components within normal limits  I-STAT CHEM 8, ED - Abnormal; Notable for the following components:   Creatinine, Ser 1.10 (*)    Glucose, Bld 243 (*)    All other components within normal limits  CBG MONITORING, ED - Abnormal; Notable for the following components:    Glucose-Capillary 236 (*)    All other components within normal limits  ETHANOL  PROTIME-INR  APTT  DIFFERENTIAL  RAPID URINE DRUG SCREEN, HOSP PERFORMED  URINALYSIS, ROUTINE W REFLEX MICROSCOPIC  I-STAT TROPONIN, ED    EKG EKG Interpretation  Date/Time:  Sunday November 01 2018 15:29:41 EST Ventricular Rate:  79 PR Interval:    QRS Duration: 100 QT Interval:  385 QTC Calculation: 442 R Axis:   -64 Text Interpretation:  Sinus rhythm LAD, consider LAFB or inferior infarct Anterior infarct, old Borderline repolarization abnormality No significant change since last tracing Confirmed by Fredia Sorrow [phone]) on 11/01/2018 3:35:54 PM   Radiology Ct Head Code Stroke Wo Contrast  Result Date: 11/01/2018 CLINICAL DATA:  Code stroke. Lost ability to speak at 1 p.m. Balance difficulty today. EXAM: CT HEAD WITHOUT CONTRAST TECHNIQUE: Contiguous axial images were obtained from the base of the skull through the vertex without intravenous contrast. COMPARISON:  08/28/2018 brain MRI. 08/27/2018 CT head and CTA head neck. FINDINGS: Brain: No evidence for acute infarction, hemorrhage, mass lesion, hydrocephalus, or extra-axial fluid. Generalized atrophy. Hypoattenuation of white matter, consistent with small vessel disease. Vascular: Calcification of the cavernous internal carotid arteries consistent with cerebrovascular atherosclerotic disease. No signs of intracranial large vessel occlusion. Skull: Intact Sinuses/Orbits: No acute  finding. Other: None ASPECTS ([place] Stroke Program Early CT Score) - Ganglionic level infarction (caudate, lentiform nuclei, internal capsule, insula, M1-M3 cortex): 7 - Supraganglionic infarction (M4-M6 cortex): 3 Total score (0-10 with 10 being normal): 10 IMPRESSION: 1. Atrophy and small vessel disease similar to priors. No acute intracranial findings. 2. ASPECTS is 10 3. These results were called by telephone at the time of interpretation on 11/01/2018 at 3:34 pm to  Dr. Fredia Sorrow via Dr. Francine Graven who verbally acknowledged these results. Electronically Signed   By: Staci Righter M.D.   On: 11/01/2018 15:39    Procedures Procedures (including critical care time)  CRITICAL CARE Performed by: Fredia Sorrow Total critical care time: 30 minutes Critical care time was exclusive of separately billable procedures and treating other patients. Critical care was necessary to treat or prevent imminent or life-threatening deterioration. Critical care was time spent personally by me on the following activities: development of treatment plan with patient and/or surrogate as well as nursing, discussions with consultants, evaluation of patient's response to treatment, examination of patient, obtaining history from patient or surrogate, ordering and performing treatments and interventions, ordering and review of laboratory studies, ordering and review of radiographic studies, pulse oximetry and re-evaluation of patient's condition.   Medications Ordered in ED Medications - No data to display   Initial Impression / Assessment and Plan / ED Course  I have reviewed the triage vital signs and the nursing notes.  Pertinent labs & imaging results that were available during my care of the patient were reviewed by me and considered in my medical decision making (see chart for details).   Patient required code stroke upon arrival.  It was not clear exactly what some of the issues have been for the past week.  With clearly there was new focal deficits with the right facial droop and difficulty speaking that occurred at 1:00's afternoon.  By time patient came back from his CT speech was sort of waxing and waning was starting to get better.  Patient evaluated by the tele-neurologist appropriately felt not to be a TPA candidate.  Patient's head CT was read as negative.  Patient had extensive work-up in September with CTA and MRI brain at that time.  Tele-neurologist here  recommended medical admission.  Hospitalist team called.  Due to no neurology coverage here this week patient will be transferred to [facility].  Spoke with Dr. Ernestina Patches.  Placed a call into the neuro hospitalist at [facility] have not received a call back yet.    Final Clinical Impressions(s) / ED Diagnoses   Final diagnoses:  Cerebrovascular accident (CVA), unspecified mechanism ([place])  Hyperglycemia    ED Discharge Orders    None       Fredia Sorrow, MD 11/01/18 1724

## 2018-11-01 NOTE — ED Notes (Signed)
Code Stroke paged out. Pt tranported to CT.

## 2018-11-01 NOTE — Consult Note (Signed)
[facility] TeleNeurology Consult Services   Date of Service:   11/01/2018 15:24:22  Impression:     .  RO Acute Ischemic Stroke     .  Anterior Circulation     .  Posterior Circulation     .  Dysequilibrium in the past week         Episode of expressive aphasia / dysarthria earlier today (improving)  Comments: CT brain shows no acute intracranial abnormality. NIHSS: mild decrease in sensation in the right facial region; mild dysarthria.  Mechanism of Stroke: Possible Thromboembolic Possible Cardioembolic Small Vessel Disease  Metrics: Last Known Well: 10/25/2018 12:00:00 [facility] Notification Time: 11/01/2018 15:23:34 Arrival Time: 11/01/2018 15:05:00 Stamp Time: 11/01/2018 15:24:22 Time First Login Attempt: 11/01/2018 15:31:17 Video Start Time: 11/01/2018 15:31:17  Symptoms: one week history of balance difficulties, and an episode earlier today (1300) of speech difficulties with right facial weakness. NIHSS Start Assessment Time: 11/01/2018 15:34:22 Patient is not a candidate for tPA. Patient was not deemed candidate for tPA thrombolytics because of Last Well Known Above 4.5 Hours. Video End Time: 11/01/2018 15:45:46  CT head showed no acute hemorrhage or acute core infarct. CT head was reviewed.  Advanced imaging was not obtained as the presentation was not suggestive of Large Vessel Occlusive Disease.   ER Physician notified of the decision on thrombolytics management on 11/01/2018 15:45:35  Our recommendations are outlined below.  Recommendations:     .  Activate Stroke Protocol Admission/Order Set     .  Stroke/Telemetry Floor     .  Neuro Checks     .  Bedside Swallow Eval     .  DVT Prophylaxis     .  IV Fluids, Normal Saline     .  Head of Bed Below 30 Degrees     .  Euglycemia and Avoid Hyperthermia (PRN Acetaminophen)     .  Antiplatelet Therapy Recommended  Routine Consultation with Inhouse Neurology for Follow up  Care  Sign Out:     .  Discussed with Emergency Department Provider    ------------------------------------------------------------------------------  History of Present Illness: Patient is a 82 year old Female.  Patient was brought by EMS for symptoms of one week history of balance difficulties, and an episode earlier today (1300) of speech difficulties with right facial weakness.  PMH: HTN, DM, CAD. Not on anticoagulation.  CT head showed no acute hemorrhage or acute core infarct. CT head was reviewed.  Last seen normal was beyond 4.5 hours of presentation. There is no history of hemorrhagic complications or intracranial hemorrhage. There is no history of Recent Anticoagulants. There is no history of recent major surgery. There is no history of recent stroke.  Examination: 1A: Level of Consciousness - Alert; keenly responsive + 0 1B: Ask Month and Age - Both Questions Right + 0 1C: Blink Eyes & Squeeze Hands - Performs Both Tasks + 0 2: Test Horizontal Extraocular Movements - Normal + 0 3: Test Visual Fields - No Visual Loss + 0 4: Test Facial Palsy (Use Grimace if Obtunded) - Normal symmetry + 0 5A: Test Left Arm Motor Drift - No Drift for 10 Seconds + 0 5B: Test Right Arm Motor Drift - No Drift for 10 Seconds + 0 6A: Test Left Leg Motor Drift - No Drift for 5 Seconds + 0 6B: Test Right Leg Motor Drift - No Drift for 5 Seconds + 0 7: Test Limb Ataxia (FNF/Heel-Shin) - No Ataxia + 0 8: Test Sensation -  Mild-Moderate Loss: Less Sharp/More Dull + 1 9: Test Language/Aphasia - Normal; No aphasia + 0 10: Test Dysarthria - Mild-Moderate Dysarthria: Slurring but can be understood + 1 11: Test Extinction/Inattention - No abnormality + 0  NIHSS Score: 2  Patient was informed the Neurology Consult would happen via TeleHealth consult by way of interactive audio and video telecommunications and consented to receiving care in this manner.  Due to the immediate potential for  life-threatening deterioration due to underlying acute neurologic illness, I spent 35 minutes providing critical care. This time includes time for face to face visit via telemedicine, review of medical records, imaging studies and discussion of findings with providers, the patient and/or family.   Dr Martina Sinner   [facility] [phone]

## 2018-11-01 NOTE — ED Notes (Signed)
Neurologist sent to [phone].

## 2018-11-02 ENCOUNTER — Observation Stay (HOSPITAL_COMMUNITY): Payer: Medicare HMO

## 2018-11-02 DIAGNOSIS — D689 Coagulation defect, unspecified: Secondary | ICD-10-CM | POA: Diagnosis present

## 2018-11-02 DIAGNOSIS — E1165 Type 2 diabetes mellitus with hyperglycemia: Secondary | ICD-10-CM | POA: Diagnosis present

## 2018-11-02 DIAGNOSIS — J449 Chronic obstructive pulmonary disease, unspecified: Secondary | ICD-10-CM | POA: Diagnosis present

## 2018-11-02 DIAGNOSIS — R29703 NIHSS score 3: Secondary | ICD-10-CM | POA: Diagnosis present

## 2018-11-02 DIAGNOSIS — R29810 Facial weakness: Secondary | ICD-10-CM | POA: Diagnosis present

## 2018-11-02 DIAGNOSIS — M5136 Other intervertebral disc degeneration, lumbar region: Secondary | ICD-10-CM | POA: Diagnosis present

## 2018-11-02 DIAGNOSIS — I110 Hypertensive heart disease with heart failure: Secondary | ICD-10-CM | POA: Diagnosis present

## 2018-11-02 DIAGNOSIS — I5032 Chronic diastolic (congestive) heart failure: Secondary | ICD-10-CM | POA: Diagnosis present

## 2018-11-02 DIAGNOSIS — G459 Transient cerebral ischemic attack, unspecified: Secondary | ICD-10-CM | POA: Diagnosis not present

## 2018-11-02 DIAGNOSIS — R4701 Aphasia: Secondary | ICD-10-CM | POA: Diagnosis present

## 2018-11-02 DIAGNOSIS — R471 Dysarthria and anarthria: Secondary | ICD-10-CM | POA: Diagnosis present

## 2018-11-02 DIAGNOSIS — I2510 Atherosclerotic heart disease of native coronary artery without angina pectoris: Secondary | ICD-10-CM | POA: Diagnosis present

## 2018-11-02 DIAGNOSIS — I639 Cerebral infarction, unspecified: Secondary | ICD-10-CM | POA: Diagnosis not present

## 2018-11-02 DIAGNOSIS — I6300 Cerebral infarction due to thrombosis of unspecified precerebral artery: Secondary | ICD-10-CM

## 2018-11-02 DIAGNOSIS — G8929 Other chronic pain: Secondary | ICD-10-CM | POA: Diagnosis present

## 2018-11-02 DIAGNOSIS — L409 Psoriasis, unspecified: Secondary | ICD-10-CM | POA: Diagnosis present

## 2018-11-02 DIAGNOSIS — E785 Hyperlipidemia, unspecified: Secondary | ICD-10-CM | POA: Diagnosis present

## 2018-11-02 DIAGNOSIS — M1990 Unspecified osteoarthritis, unspecified site: Secondary | ICD-10-CM | POA: Diagnosis present

## 2018-11-02 DIAGNOSIS — N390 Urinary tract infection, site not specified: Secondary | ICD-10-CM | POA: Diagnosis present

## 2018-11-02 DIAGNOSIS — E1151 Type 2 diabetes mellitus with diabetic peripheral angiopathy without gangrene: Secondary | ICD-10-CM | POA: Diagnosis present

## 2018-11-02 DIAGNOSIS — Z6833 Body mass index (BMI) 33.0-33.9, adult: Secondary | ICD-10-CM | POA: Diagnosis not present

## 2018-11-02 DIAGNOSIS — N179 Acute kidney failure, unspecified: Secondary | ICD-10-CM | POA: Diagnosis present

## 2018-11-02 DIAGNOSIS — E039 Hypothyroidism, unspecified: Secondary | ICD-10-CM | POA: Diagnosis present

## 2018-11-02 DIAGNOSIS — E662 Morbid (severe) obesity with alveolar hypoventilation: Secondary | ICD-10-CM | POA: Diagnosis present

## 2018-11-02 DIAGNOSIS — E860 Dehydration: Secondary | ICD-10-CM | POA: Diagnosis present

## 2018-11-02 DIAGNOSIS — E1142 Type 2 diabetes mellitus with diabetic polyneuropathy: Secondary | ICD-10-CM | POA: Diagnosis present

## 2018-11-02 LAB — GLUCOSE, CAPILLARY
GLUCOSE-CAPILLARY: 226 mg/dL — AB (ref 70–99)
GLUCOSE-CAPILLARY: 271 mg/dL — AB (ref 70–99)
Glucose-Capillary: 240 mg/dL — ABNORMAL HIGH (ref 70–99)
Glucose-Capillary: 293 mg/dL — ABNORMAL HIGH (ref 70–99)
Glucose-Capillary: 240 mg/dL — ABNORMAL HIGH (ref 70–99)

## 2018-11-02 MED ORDER — ENOXAPARIN SODIUM 40 MG/0.4ML ~~LOC~~ SOLN
40.0000 mg | SUBCUTANEOUS | Status: DC
  Administered 2018-11-02: 40 mg via SUBCUTANEOUS
  Filled 2018-11-02: qty 0.4

## 2018-11-02 MED ORDER — LEVOTHYROXINE SODIUM 25 MCG PO TABS
25.0000 ug | ORAL_TABLET | Freq: Every day | ORAL | Status: DC
  Administered 2018-11-02 – 2018-11-03 (×2): 25 ug via ORAL
  Filled 2018-11-02 (×2): qty 1

## 2018-11-02 MED ORDER — LORAZEPAM 1 MG PO TABS
1.0000 mg | ORAL_TABLET | Freq: Every day | ORAL | Status: DC
  Administered 2018-11-02 (×2): 1 mg via ORAL
  Filled 2018-11-02 (×2): qty 1

## 2018-11-02 MED ORDER — DIPHENHYDRAMINE HCL 25 MG PO CAPS
25.0000 mg | ORAL_CAPSULE | Freq: Three times a day (TID) | ORAL | Status: DC | PRN
  Administered 2018-11-02 (×2): 25 mg via ORAL
  Filled 2018-11-02 (×2): qty 1

## 2018-11-02 MED ORDER — CEFDINIR 300 MG PO CAPS
300.0000 mg | ORAL_CAPSULE | Freq: Two times a day (BID) | ORAL | Status: DC
  Administered 2018-11-02 – 2018-11-03 (×3): 300 mg via ORAL
  Filled 2018-11-02 (×5): qty 1

## 2018-11-02 MED ORDER — AMLODIPINE BESYLATE 5 MG PO TABS
5.0000 mg | ORAL_TABLET | Freq: Every day | ORAL | Status: DC
  Administered 2018-11-02 – 2018-11-03 (×2): 5 mg via ORAL
  Filled 2018-11-02 (×2): qty 1

## 2018-11-02 MED ORDER — METFORMIN HCL 500 MG PO TABS
500.0000 mg | ORAL_TABLET | Freq: Two times a day (BID) | ORAL | Status: DC
  Administered 2018-11-02 – 2018-11-03 (×3): 500 mg via ORAL
  Filled 2018-11-02 (×3): qty 1

## 2018-11-02 MED ORDER — OXYCODONE HCL 5 MG PO TABS
5.0000 mg | ORAL_TABLET | ORAL | Status: DC | PRN
  Administered 2018-11-02 – 2018-11-03 (×4): 5 mg via ORAL
  Filled 2018-11-02 (×4): qty 1

## 2018-11-02 MED ORDER — INSULIN ASPART 100 UNIT/ML ~~LOC~~ SOLN
0.0000 [IU] | Freq: Every day | SUBCUTANEOUS | Status: DC
  Administered 2018-11-02 (×2): 2 [IU] via SUBCUTANEOUS

## 2018-11-02 MED ORDER — INSULIN ASPART 100 UNIT/ML ~~LOC~~ SOLN
0.0000 [IU] | Freq: Three times a day (TID) | SUBCUTANEOUS | Status: DC
  Administered 2018-11-02: 3 [IU] via SUBCUTANEOUS
  Administered 2018-11-02 (×2): 5 [IU] via SUBCUTANEOUS
  Administered 2018-11-03: 3 [IU] via SUBCUTANEOUS
  Administered 2018-11-03: 7 [IU] via SUBCUTANEOUS

## 2018-11-02 MED ORDER — CITALOPRAM HYDROBROMIDE 10 MG PO TABS
20.0000 mg | ORAL_TABLET | Freq: Every day | ORAL | Status: DC
  Administered 2018-11-02 – 2018-11-03 (×2): 20 mg via ORAL
  Filled 2018-11-02 (×2): qty 2

## 2018-11-02 MED ORDER — LISINOPRIL 10 MG PO TABS: 10.0000 mg | ORAL_TABLET | Freq: Every day | ORAL | Status: DC

## 2018-11-02 MED ORDER — GABAPENTIN 300 MG PO CAPS
300.0000 mg | ORAL_CAPSULE | Freq: Two times a day (BID) | ORAL | Status: DC
  Administered 2018-11-02 – 2018-11-03 (×3): 300 mg via ORAL
  Filled 2018-11-02 (×3): qty 1

## 2018-11-02 MED ORDER — PREDNISONE 20 MG PO TABS
20.0000 mg | ORAL_TABLET | Freq: Two times a day (BID) | ORAL | Status: DC
  Administered 2018-11-02 – 2018-11-03 (×3): 20 mg via ORAL
  Filled 2018-11-02 (×3): qty 1

## 2018-11-02 MED ORDER — ASPIRIN EC 81 MG PO TBEC
81.0000 mg | DELAYED_RELEASE_TABLET | Freq: Every day | ORAL | Status: DC
  Administered 2018-11-02 – 2018-11-03 (×2): 81 mg via ORAL
  Filled 2018-11-02 (×2): qty 1

## 2018-11-02 MED ORDER — INSULIN ASPART PROT & ASPART (70-30 MIX) 100 UNIT/ML ~~LOC~~ SUSP
32.0000 [IU] | Freq: Two times a day (BID) | SUBCUTANEOUS | Status: DC
  Administered 2018-11-02 – 2018-11-03 (×3): 32 [IU] via SUBCUTANEOUS
  Filled 2018-11-02: qty 10

## 2018-11-02 NOTE — Consult Note (Signed)
Referring Physician: Dr. Laren Everts    Chief Complaint: TIA  HPI: [name] [name] is an 82 y.o. female who initially presented to the [facility] ED on Sunday afternoon with difficulty speaking, left sided facial droop and balance difficulties. She had been having difficulty walking due to loss of balance and generalized weakness for about one week. Subsequently, on Friday afternoon at 1300, the patient had sudden onset of inability to speak with left sided facial droop. She also noted at that time that she had to think very hard to move her arms and legs. She was brought to the [facility] ED where she was noted to be able to "speak minimally". A Code Stroke was called and Teleneurologist was consulted. CT brain showed no acute intracranial abnormality, with atrophy and small vessel disease noted. After completion of the CT, her speech had improved somewhat and her NIHSS was 2. The patient was not a tPA candidate due to [facility] being > 4.5 hours. She was not a candidate for VIR as her presentation was not suggestive of large vessel occlusive disease. Antiplatelet therapy was recommended.   In September the patient was admitted for a possible TIA or CVA and underwent a work up at that time, with CT head being negative but CTA showing multiple areas of atherosclerotic disease. She also had an MRI during that work up, which was negative for acute stroke.  Her PMHx includes DM2, CAD and MI s/p CABG in 2001, coronary angioplasty with stent placement, diastolic CHF, HTN, arthritis, COPD, lumbar DDD, prior CVA with lacunar infarct of right thalamus in 2013, HLD, hypothyroidism, OSA, peripheral neuropathy and psoriasis.   EKG: Sinus rhythm  MRI obtained at [facility] shows no acute infarction. MRA shows no LVO.   MRI HEAD IMPRESSION: 1. No acute intracranial abnormality. 2. Age-related cerebral atrophy with moderate chronic small vessel ischemic disease. Associated small remote lacunar infarcts involving the bilateral thalami and left  cerebellum.  MRA HEAD IMPRESSION: 1. Negative intracranial MRA for large vessel occlusion. 2. Moderate to advanced atherosclerotic change throughout the intracranial circulation. Notable findings include short-segment severe cavernous left ICA stenosis, severe right A1 stenosis, severe left M2 stenosis, with moderate to severe multifocal bilateral PCA stenoses. 3. Extensive distal small vessel atheromatous irregularity   Past Medical History:  Diagnosis Date  . Arthritis   . CAD (coronary artery disease)    Multivessel status post CABG 2001  . Chronic obstructive pulmonary disease ([place])   . DDD (degenerative disc disease), lumbar   . Diastolic CHF ([place])   . Essential hypertension   . Hematuria   . History of CVA (cerebrovascular accident)    2013 - lacunar infarct right thalamus - residual left side of  lip numb  and left eye vision worse (which corrented lens implant from cataract extraction)  . History of MI (myocardial infarction)    July 2001  . Hyperlipidemia   . Hypothyroidism   . Insulin dependent type 2 diabetes mellitus ([place])   . Lesion of bladder   . Lower urinary tract symptoms (LUTS)   . OSA (obstructive sleep apnea)   . Peripheral neuropathy   . Psoriasis   . SUI (stress urinary incontinence, female)   . Varicose veins     Past Surgical History:  Procedure Laterality Date  . APPENDECTOMY  age 89  . CARDIAC CATHETERIZATION  1991   No sig. cad  . CARDIAC CATHETERIZATION  07-01-2000   high grade in-stent restenosis  . CARDIAC CATHETERIZATION  04-30-2001   single  native vessel cad/ graft patent  . CARDIAC CATHETERIZATION  02-13-2006  dr Danae Orleans cad with total occluded LAD  and  70% ostial of small ramus/  grafts patent/  normal ef  . CARDIAC CATHETERIZATION  01-20-2000  dr Angelena Form   essentially unchanged;  severe single vessel cad with diffuse disease throughout CFX and RCA/ ef 66%;  chf with preserved LVSF  . CARDIOVASCULAR STRESS TEST  08-28-2011    DR ROTHBART   NEGATIVE NUCLEAR STUDY/  GLOBAL LVSF/  EF 65%  . CARPAL TUNNEL RELEASE Bilateral yrs ago  . CATARACT EXTRACTION W/ INTRAOCULAR LENS  IMPLANT, BILATERAL  2013  . CHOLECYSTECTOMY  1990  . COLONOSCOPY N/A 07/26/2015   Procedure: COLONOSCOPY;  Surgeon: Rogene Houston, MD;  [facility];  Service: Endoscopy;  Laterality: N/A;  1200  . CORONARY ANGIOPLASTY  11-13-1999   balloon angioplasty to mLAD , d2 of LAD  . CORONARY ANGIOPLASTY WITH STENT PLACEMENT  01-17-2000   PCI stenting to mLAD & D2 of LAD  . CORONARY ARTERY BYPASS GRAFT  07-02-2000   DR Darylene Price   SVG to diagonal, LIMA to LAD  . CYSTOSCOPY W/ RETROGRADES Bilateral 08/09/2014   Procedure: CYSTOSCOPY, BILATERAL RETROGRADE, HYDRODISTENSION, BLADDER BIOPSY WITH FULGERATION, INSTILL PYRIDIUM AND MARCAINE ;  Surgeon: Malka So, MD;  Location: [facility];  Service: Urology;  Laterality: Bilateral;  . KNEE ARTHROSCOPY Left 10-18-2003   SYNOVECTOMY  . LUMBAR SPINE SURGERY  x2    yrs ago  . ORIF HUMERUS FRACTURE Right 04-18-2008  . REPAIR FLEXOR TENDON HAND Right 09-20-2008   CARPI RADIALIS TENDON TRANSFER TO EXTENSOR TO INDEX, MIDDLE, RING , LITTLE FINGERS AND DIGITI MININI  . ROTATOR CUFF REPAIR Right 1995  . TOTAL ABDOMINAL HYSTERECTOMY W/ BILATERAL SALPINGOOPHORECTOMY  age 74  . TRANSTHORACIC ECHOCARDIOGRAM  12-12-2013   MILD LVH/  EF 32-99%/  GRADE I DIASTOLIC DYSFUNCTION/  MILD LAE    Family History  Problem Relation Age of Onset  . Hypertension Mother   . Stroke Mother   . Coronary artery disease Unknown        Multiple first and second-degree relatives  . Aneurysm Unknown        Cerebral circulation  . Colon cancer Sister   . Stroke Sister   . Coronary artery disease Sister   . Clotting disorder Unknown        Children diagnosed with hypercoagulable state  . Coronary artery disease Brother    Social History:  reports that she has never smoked. She has never used smokeless  tobacco. She reports that she does not drink alcohol or use drugs.  Allergies:  Allergies  Allergen Reactions  . Codeine Itching    Medications:  Prior to Admission:  Medications Prior to Admission  Medication Sig Dispense Refill Last Dose  . amLODipine (NORVASC) 5 MG tablet Take 5 mg by mouth daily.  5 11/01/2018 at Unknown time  . aspirin EC 81 MG tablet Take 81 mg by mouth daily.    10/31/2018 at Unknown time  . citalopram (CELEXA) 20 MG tablet Take 1 tablet (20 mg total) by mouth daily. 30 tablet 2 11/01/2018 at Unknown time  . gabapentin (NEURONTIN) 300 MG capsule Take 300 mg by mouth 2 (two) times daily.  2 11/01/2018 at Unknown time  . insulin NPH-regular Human (NOVOLIN 70/30) (70-30) 100 UNIT/ML injection Inject 40 Units into the skin 2 (two) times daily.   11/01/2018 at Unknown time  .  ipratropium-albuterol (DUONEB) 0.5-2.5 (3) MG/3ML SOLN Inhale 3 mLs into the lungs 4 (four) times daily as needed (for shortness of breath).   5 unknown  . levothyroxine (SYNTHROID, LEVOTHROID) 25 MCG tablet Take 25 mcg by mouth daily.    11/01/2018 at Unknown time  . lisinopril (PRINIVIL,ZESTRIL) 10 MG tablet Take 10 mg by mouth daily.   11/01/2018 at Unknown time  . LORazepam (ATIVAN) 1 MG tablet Take 1 tablet (1 mg total) by mouth at bedtime. 30 tablet 0 10/31/2018 at Unknown time  . metFORMIN (GLUCOPHAGE) 500 MG tablet Take 500 mg by mouth 2 (two) times daily with a meal.     11/01/2018 at Unknown time  . nitroGLYCERIN (NITROSTAT) 0.4 MG SL tablet Place 0.4 mg under the tongue every 5 (five) minutes as needed for chest pain.    unknown  . oxyCODONE (OXY IR/ROXICODONE) 5 MG immediate release tablet Take 1 tablet (5 mg total) by mouth every 4 (four) hours as needed (Moderate pain). 30 tablet 0 11/01/2018 at Unknown time  . predniSONE (DELTASONE) 20 MG tablet Take 20 mg by mouth 2 (two) times daily with a meal.   1 11/01/2018 at Unknown time  . triamcinolone cream (KENALOG) 0.1 % Apply 1 application  topically 2 (two) times daily as needed (for flares).   1 unknown  . Dextromethorphan-guaiFENesin (MUCUS RELIEF DM) 30-600 MG TB12 Take 1 tablet by mouth every 12 (twelve) hours. (Patient not taking: Reported on 11/01/2018) 28 tablet 1 Not Taking at Unknown time  . docusate sodium (COLACE) 100 MG capsule Take 1 capsule (100 mg total) by mouth daily. (Patient not taking: Reported on 11/01/2018) 10 capsule 0 Not Taking at Unknown time  . sulfamethoxazole-trimethoprim (BACTRIM DS,SEPTRA DS) 800-160 MG tablet Take 1 tablet by mouth daily. (Patient not taking: Reported on 11/01/2018) 30 tablet 5 Not Taking at Unknown time   Scheduled: .  stroke: mapping our early stages of recovery book   Does not apply Once  . enoxaparin (LOVENOX) injection  40 mg Subcutaneous Q24H  . insulin aspart  0-5 Units Subcutaneous QHS  . insulin aspart  0-9 Units Subcutaneous TID WC  . insulin aspart protamine- aspart  32 Units Subcutaneous BID WC  . LORazepam  1 mg Oral QHS   Continuous: . sodium chloride      ROS: No headache. Has intermittent visual blurring. No CP. No asymmetry of limb weakness.   Physical Examination: Blood pressure (!) 141/83, pulse 61, temperature 97.7 F (36.5 C), temperature source Oral, resp. rate 16, height 5' 4.5" (1.638 m), weight 88.9 kg, SpO2 93 %.  Gen: Morbidly obese HEENT: [place]/AT Lungs: Respirations unlabored Ext: Psoriasis lesions to BLE noted. No edema.   Neurologic Examination: Mental Status: Awake with mildly decreased level of alertness. Poorly oriented. Speech with subtle dysarthria, but fluent. Some difficulty with comprehension and naming.  Cranial Nerves: II:  Visual fields intact on limited bedside testing due to patient poor compliance; temporal visual fields tested, unable to test nasal fields. PERRL. III,IV, VI: EOMI without nystagmus. No ptosis. Squints eyes at times.  V,VII: Subtle/equivocal right facial droop. Decreased temp sensation on the right. VIII: hearing  intact to voice IX,X: No hypophonia XI: Subtle lag on the right with shoulder shrug XII: midline tongue extension  Motor: 4+/5 bilateral upper extremities proximally and distally with variable effort 4+/5 RLE 4/5 LLE hip flexion and knee extension with variable effort and complaint of knee and hip pain. Ankle dorsiflexion and plantar flexion 4+/5.  Sensory: Temp  and light touch intact proximally x 4 Deep Tendon Reflexes:  Hypoactive reflexes in upper and lower extremities.  Cerebellar: No ataxia with FNF and H-S bilaterally Gait: Deferred  Results for orders placed or performed during the hospital encounter of 11/01/18 (from the past 48 hour(s))  Ethanol     Status: None   Collection Time: 11/01/18  3:19 PM  Result Value Ref Range   Alcohol, Ethyl (B) <10 <10 mg/dL    Comment: Performed at [facility], [address], [place], [place] [address]  Protime-INR     Status: None   Collection Time: 11/01/18  3:19 PM  Result Value Ref Range   Prothrombin Time 12.7 11.4 - 15.2 seconds   INR 0.96     Comment: Performed at [facility] [address], [place], [place] [address]  APTT     Status: None   Collection Time: 11/01/18  3:19 PM  Result Value Ref Range   aPTT 30 24 - 36 seconds    Comment: Performed at [facility], [address] [place], [place] [address]  CBC     Status: Abnormal   Collection Time: 11/01/18  3:19 PM  Result Value Ref Range   WBC 9.3 4.0 - 10.5 K/uL   RBC 5.39 (H) 3.87 - 5.11 MIL/uL   Hemoglobin 12.9 12.0 - 15.0 g/dL   HCT 43.8 36.0 - 46.0 %   MCV 81.3 80.0 - 100.0 fL   MCH 23.9 (L) 26.0 - 34.0 pg   MCHC 29.5 (L) 30.0 - 36.0 g/dL   RDW 16.5 (H) 11.5 - 15.5 %   Platelets 331 150 - 400 K/uL   nRBC 0.0 0.0 - 0.2 %    Comment: Performed at [facility], [address] [place], [place] [address]  Differential     Status: None   Collection Time: 11/01/18  3:19 PM  Result Value Ref Range   Neutrophils Relative % 59 %   Neutro Abs 5.5 1.7 - 7.7 K/uL    Lymphocytes Relative 27 %   Lymphs Abs 2.5 0.7 - 4.0 K/uL   Monocytes Relative 10 %   Monocytes Absolute 0.9 0.1 - 1.0 K/uL   Eosinophils Relative 4 %   Eosinophils Absolute 0.4 0.0 - 0.5 K/uL   Basophils Relative 0 %   Basophils Absolute 0.0 0.0 - 0.1 K/uL   Immature Granulocytes 0 %   Abs Immature Granulocytes 0.04 0.00 - 0.07 K/uL    Comment: Performed at [facility], [address], [place],  [address]  Comprehensive metabolic panel     Status: Abnormal   Collection Time: 11/01/18  3:19 PM  Result Value Ref Range   Sodium 136 135 - 145 mmol/L   Potassium 3.8 3.5 - 5.1 mmol/L   Chloride 102 98 - 111 mmol/L   CO2 25 22 - 32 mmol/L   Glucose, Bld 242 (H) 70 - 99 mg/dL   BUN 11 8 - 23 mg/dL   Creatinine, Ser 1.03 (H) 0.44 - 1.00 mg/dL   Calcium 9.5 8.9 - 10.3 mg/dL   Total Protein 7.4 6.5 - 8.1 g/dL   Albumin 4.0 3.5 - 5.0 g/dL   AST 17 15 - 41 U/L   ALT 15 0 - 44 U/L   Alkaline Phosphatase 70 38 - 126 U/L   Total Bilirubin 0.7 0.3 - 1.2 mg/dL   GFR calc non Af Amer 50 (L) >60 mL/min   GFR calc Af Amer 58 (L) >60 mL/min  Comment: (NOTE) The eGFR has been calculated using the CKD EPI equation. This calculation has not been validated in all clinical situations. eGFR's persistently <60 mL/min signify possible Chronic Kidney Disease.    Anion gap 9 5 - 15    Comment: Performed at [facility], [address] [place], [place] [address]  I-stat troponin, ED     Status: None   Collection Time: 11/01/18  3:26 PM  Result Value Ref Range   Troponin i, poc 0.00 0.00 - 0.08 ng/mL   Comment 3            Comment: Due to the release kinetics of cTnI, a negative result within the first hours of the onset of symptoms does not rule out myocardial infarction with certainty. If myocardial infarction is still suspected, repeat the test at appropriate intervals.   I-Stat Chem 8, ED     Status: Abnormal   Collection Time: 11/01/18  3:28 PM  Result Value Ref Range   Sodium 138  135 - 145 mmol/L   Potassium 3.9 3.5 - 5.1 mmol/L   Chloride 101 98 - 111 mmol/L   BUN 10 8 - 23 mg/dL   Creatinine, Ser 1.10 (H) 0.44 - 1.00 mg/dL   Glucose, Bld 243 (H) 70 - 99 mg/dL   Calcium, Ion 1.19 1.15 - 1.40 mmol/L   TCO2 27 22 - 32 mmol/L   Hemoglobin 14.6 12.0 - 15.0 g/dL   HCT 43.0 36.0 - 46.0 %  CBG monitoring, ED     Status: Abnormal   Collection Time: 11/01/18  3:29 PM  Result Value Ref Range   Glucose-Capillary 236 (H) 70 - 99 mg/dL  Urine rapid drug screen (hosp performed)     Status: None   Collection Time: 11/01/18  6:15 PM  Result Value Ref Range   Opiates NONE DETECTED NONE DETECTED   Cocaine NONE DETECTED NONE DETECTED   Benzodiazepines NONE DETECTED NONE DETECTED   Amphetamines NONE DETECTED NONE DETECTED   Tetrahydrocannabinol NONE DETECTED NONE DETECTED   Barbiturates NONE DETECTED NONE DETECTED    Comment: (NOTE) DRUG SCREEN FOR MEDICAL PURPOSES ONLY.  IF CONFIRMATION IS NEEDED FOR ANY PURPOSE, NOTIFY LAB WITHIN 5 DAYS. LOWEST DETECTABLE LIMITS FOR URINE DRUG SCREEN Drug Class                     Cutoff (ng/mL) Amphetamine and metabolites    1000 Barbiturate and metabolites    200 Benzodiazepine                 326 Tricyclics and metabolites     300 Opiates and metabolites        300 Cocaine and metabolites        300 THC                            50 Performed at [facility], [address] [place], [place] [address]   Urinalysis, Routine w reflex microscopic     Status: Abnormal   Collection Time: 11/01/18  6:15 PM  Result Value Ref Range   Color, Urine YELLOW YELLOW   APPearance CLOUDY (A) CLEAR   Specific Gravity, Urine 1.012 1.005 - 1.030   pH 5.0 5.0 - 8.0   Glucose, UA >=500 (A) NEGATIVE mg/dL   Hgb urine dipstick NEGATIVE NEGATIVE   Bilirubin Urine NEGATIVE NEGATIVE   Ketones, ur NEGATIVE NEGATIVE mg/dL   Protein, ur NEGATIVE NEGATIVE mg/dL  Nitrite POSITIVE (A) NEGATIVE   Leukocytes, UA LARGE (A) NEGATIVE   RBC / HPF 6-10 0 -  5 RBC/hpf   WBC, UA >50 (H) 0 - 5 WBC/hpf   Bacteria, UA FEW (A) NONE SEEN   Squamous Epithelial / LPF 11-20 0 - 5   WBC Clumps PRESENT    Mucus PRESENT    Hyaline Casts, UA PRESENT     Comment: Performed at [facility], [address] [place], [place] [address]  Glucose, capillary     Status: Abnormal   Collection Time: 11/02/18  1:06 AM  Result Value Ref Range   Glucose-Capillary 226 (H) 70 - 99 mg/dL   Ct Head Code Stroke Wo Contrast  Result Date: 11/01/2018 CLINICAL DATA:  Code stroke. Lost ability to speak at 1 p.m. Balance difficulty today. EXAM: CT HEAD WITHOUT CONTRAST TECHNIQUE: Contiguous axial images were obtained from the base of the skull through the vertex without intravenous contrast. COMPARISON:  08/28/2018 brain MRI. 08/27/2018 CT head and CTA head neck. FINDINGS: Brain: No evidence for acute infarction, hemorrhage, mass lesion, hydrocephalus, or extra-axial fluid. Generalized atrophy. Hypoattenuation of white matter, consistent with small vessel disease. Vascular: Calcification of the cavernous internal carotid arteries consistent with cerebrovascular atherosclerotic disease. No signs of intracranial large vessel occlusion. Skull: Intact Sinuses/Orbits: No acute finding. Other: None ASPECTS ([place] Stroke Program Early CT Score) - Ganglionic level infarction (caudate, lentiform nuclei, internal capsule, insula, M1-M3 cortex): 7 - Supraganglionic infarction (M4-M6 cortex): 3 Total score (0-10 with 10 being normal): 10 IMPRESSION: 1. Atrophy and small vessel disease similar to priors. No acute intracranial findings. 2. ASPECTS is 10 3. These results were called by telephone at the time of interpretation on 11/01/2018 at 3:34 pm to Dr. Fredia Sorrow via Dr. Francine Graven who verbally acknowledged these results. Electronically Signed   By: Staci Righter M.D.   On: 11/01/2018 15:39    Assessment: 82 y.o. female presenting with difficulty speaking, left sided facial droop and  balance difficulties 1. CT head reveals  atrophy and small vessel disease similar to priors. No acute intracranial findings. 2. MRI brain without acute infarction. Overall clinical picture most consistent with TIA.  3. MRA head reveals moderate to advanced atherosclerotic change throughout the intracranial circulation. 4. Stroke Risk Factors - DM2, CAD, CHF, HTN, prior CVA, HLD, OSA  5. Hypoactive reflexes in upper and lower extremities. May be due to peripheral neuropathy and/or hypothyroidism  Plan: 1. HgbA1c, fasting lipid panel 2. Frequent neuro checks 3. PT consult, OT consult, Speech consult 4. TTE 5. Carotid dopplers 6. Prophylactic therapy- Continue ASA.  7. Risk factor modification 8. Telemetry monitoring 9. TSH level 10. BP management. Out of permissive HTN time window.    '@Electronically'$  signed: Dr. Kerney Elbe 11/02/2018, 4:31 AM

## 2018-11-02 NOTE — Evaluation (Signed)
Occupational Therapy Evaluation Patient Details Name: [name] [name] MRN: [id number] DOB: 1936-12-11 Today's Date: 11/02/2018    History of Present Illness 82yo female with progressive onset of R facial droop and weakness that has lasted 3 weeks. Intermittent aphasia. CT/MRI negative for acute infarct. PMH COPD, HTN, CVA, MI, hypothyroidism, DM, peripheral neuropathy, cardiac cath, carpal tunnel release, CABG 2001, knee scope, lumbar surgery, RCR    Clinical Impression   This 82 y/o female presents with the above. At baseline pt reports she is mod independent with ADLs and functional mobility, though reports increased difficulty with LB dressing (mostly socks). Pt lethargic this session and requires frequent cues to maintain eyes open throughout, is easy to arouse though she easily dozes off. Pt requiring minA for room level functional mobility using RW, she currently requires supervision-minguard assist for UB ADL, modA for LB ADLs. Pt also presenting with UE weakness and decreased activity tolerance. She reports she lives with spouse who is not able to physically assist at home, though reports she has additional family members who can assist. Pt will benefit from continued acute OT services and recommend follow up [facility] therapy services after discharge to maximize her overall safety and independence with ADLs and mobility. Will follow.     Follow Up Recommendations  Home health OT;Supervision/Assistance - 24 hour    Equipment Recommendations  3 in 1 bedside commode           Precautions / Restrictions Precautions Precautions: Fall;Other (comment) Precaution Comments: limited activity tolerance at baseline  Restrictions Weight Bearing Restrictions: No      Mobility Bed Mobility Overal bed mobility: Needs Assistance Bed Mobility: Supine to Sit     Supine to sit: Min assist     General bed mobility comments: MinA to bring trunk up at EOB and scoot hips forward    Transfers Overall transfer level: Needs assistance Equipment used: Rolling walker (2 wheeled) Transfers: Sit to/from Stand Sit to Stand: Min assist         General transfer comment: assist to rise and steady at RW; VCs for hand placement on EOB however pt continuing to attempt standing with bil hands on RW    Balance Overall balance assessment: Needs assistance Sitting-balance support: Bilateral upper extremity supported;Feet supported Sitting balance-Leahy Scale: Fair     Standing balance support: Bilateral upper extremity supported;During functional activity Standing balance-Leahy Scale: Poor Standing balance comment: reliance on Bil UE support on RW and external assist from therapist                           ADL either performed or assessed with clinical judgement   ADL Overall ADL's : Needs assistance/impaired Eating/Feeding: Modified independent;Sitting Eating/Feeding Details (indicate cue type and reason): pt opening containers on lunch tray while seated in recliner Grooming: Set up;Min guard;Sitting   Upper Body Bathing: Min guard;Sitting   Lower Body Bathing: Minimal assistance;Sit to/from stand   Upper Body Dressing : Min guard;Sitting   Lower Body Dressing: Moderate assistance;Sit to/from stand Lower Body Dressing Details (indicate cue type and reason): pt reports difficulties at baseline performing LB tasks, able to reach and pull up socks further over ankles while seated EOB though with significant effort; reports family usually assists with socks and she "manages" with putting on pants Toilet Transfer: Minimal assistance;Ambulation;Regular Toilet;Grab bars;RW Statistician Details (indicate cue type and reason): simulated in transfer to recliner, pt ambulating room distance around EOB to recliner with minA  Toileting- Clothing Manipulation and Hygiene: Minimal assistance;Sit to/from stand       Functional mobility during ADLs: Minimal  assistance;Rolling walker General ADL Comments: pt limited due to lethargy and fatigue      Vision Baseline Vision/History: (reports hx of eye surgery, doesn't wear glasses) Patient Visual Report: No change from baseline Vision Assessment?: Yes Eye Alignment: Impaired (comment)(left eye droop (suspect partly due to lethargy)`) Ocular Range of Motion: Impaired-to be further tested in functional context Alignment/Gaze Preference: Within Defined Limits Tracking/Visual Pursuits: Unable to hold eye position out of midline;Decreased smoothness of horizontal tracking;Decreased smoothness of vertical tracking;Left eye does not track laterally;Left eye does not track medially;Impaired - to be further tested in functional context Additional Comments: pt requires increased cues to maintain eyes open for visual assessment, increased difficulty tracking towards L visual fields and unable to maintain eyes out of midline towards L visual field     Perception     Praxis      Pertinent Vitals/Pain Pain Assessment: Faces Pain Score: 0-No pain Faces Pain Scale: Hurts little more Pain Location: head, back, hip Pain Descriptors / Indicators: Aching;Headache;Sore Pain Intervention(s): Limited activity within patient's tolerance;Monitored during session;Repositioned     Hand Dominance Right   Extremity/Trunk Assessment Upper Extremity Assessment Upper Extremity Assessment: Generalized weakness;RUE deficits/detail;LUE deficits/detail RUE Deficits / Details: hx of rotator cuff sx, AROM WFL, strength grossly 3/5, decreased grip strength LUE Deficits / Details: grossly 3/5 strength, decreased grip strength, reports numbness/tingling in LUE LUE Sensation: decreased light touch LUE Coordination: decreased fine motor   Lower Extremity Assessment Lower Extremity Assessment: Defer to PT evaluation   Cervical / Trunk Assessment Cervical / Trunk Assessment: Kyphotic   Communication  Communication Communication: No difficulties   Cognition Arousal/Alertness: Lethargic Behavior During Therapy: WFL for tasks assessed/performed Overall Cognitive Status: Within Functional Limits for tasks assessed                                 General Comments: pt overall appropriate and appears to be accurate historian regarding PLOF, though difficult to fully assess as pt nodding off throughout duration of session, arouses easily but difficulty maintaining eyes open   General Comments       Exercises     Shoulder Instructions      Home Living Family/patient expects to be discharged to:: Private residence Living Arrangements: Spouse/significant other;Children Available Help at Discharge: Family Type of Home: House Home Access: Stairs to enter Secretary/administrator of Steps: 2 Entrance Stairs-Rails: Right;Left;Can reach both Home Layout: One level     Bathroom Shower/Tub: Chief Strategy Officer: Standard     Home Equipment: Production assistant, radio - single point   Additional Comments: pt reports spouse cannot physically assist; reports has other family members who are check in and can assist intermittently  Lives With: Spouse    Prior Functioning/Environment Level of Independence: Independent with assistive device(s)        Comments: community ambulator with [employer] PRN, drives        OT Problem List: Decreased strength;Decreased range of motion;Decreased activity tolerance;Decreased safety awareness;Decreased knowledge of use of DME or AE;Impaired balance (sitting and/or standing)      OT Treatment/Interventions: Self-care/ADL training;Therapeutic exercise;DME and/or AE instruction;Therapeutic activities;Patient/family education;Balance training    OT Goals(Current goals can be found in the care plan section) Acute Rehab OT Goals Patient Stated Goal: go home, feel better  OT Goal Formulation: With patient  Time For Goal Achievement:  11/16/18 Potential to Achieve Goals: Good  OT Frequency: Min 2X/week   Barriers to D/C:            Co-evaluation              AM-PAC PT "6 Clicks" Daily Activity     Outcome Measure Help from another person eating meals?: None Help from another person taking care of personal grooming?: A Little Help from another person toileting, which includes using toliet, bedpan, or urinal?: A Little Help from another person bathing (including washing, rinsing, drying)?: A Little Help from another person to put on and taking off regular upper body clothing?: A Little Help from another person to put on and taking off regular lower body clothing?: A Lot 6 Click Score: 18   End of Session Equipment Utilized During Treatment: Gait belt;Rolling walker Nurse Communication: Mobility status  Activity Tolerance: Patient limited by fatigue;Patient limited by lethargy Patient left: in chair;with call bell/phone within reach;with chair alarm set  OT Visit Diagnosis: Unsteadiness on feet (R26.81);Muscle weakness (generalized) (M62.81);Other symptoms and signs involving the nervous system (R29.898)                Time: 1610-9604 OT Time Calculation (min): 23 min Charges:  OT General Charges $OT Visit: 1 Visit OT Evaluation $OT Eval Moderate Complexity: 1 Mod  Marcy Siren, OT [employer] Pager [phone] Office [phone]   Orlando Penner 11/02/2018, 2:12 PM

## 2018-11-02 NOTE — Evaluation (Signed)
Speech Language Pathology Evaluation Patient Details Name: [name] [name] MRN: [id number] DOB: 02-16-1936 Today's Date: 11/02/2018 Time: 1015-1050 SLP Time Calculation (min) (ACUTE ONLY): 35 min  Problem List:  Patient Active Problem List   Diagnosis Date Noted  . CVA (cerebral vascular accident) (HCC) 11/01/2018  . Syncope   . Loss of consciousness (HCC) 06/10/2018  . Hyperglycemia 06/10/2018  . Bradycardia, sinus 06/10/2018  . Diastolic dysfunction 09/29/2017  . CAD (coronary artery disease) 09/28/2017  . Acute lower UTI 09/28/2017  . Ischemic stroke (HCC) 05/10/2017  . Acute ischemic stroke (HCC) 05/10/2017  . Stroke (HCC) 08/01/2016  . Dysarthria 03/18/2016  . Anemia of chronic disease 11/19/2015  . OSA (obstructive sleep apnea) 11/19/2015  . Left facial numbness 08/29/2015  . Gait instability 06/26/2015  . COPD exacerbation (HCC) 11/13/2014  . Weakness generalized 12/10/2013  . TIA (transient ischemic attack) 06/22/2012  . Left-sided headache 06/22/2012  . Noncompliance 06/22/2012  . Noncompliance with CPAP treatment 06/22/2012  . Psoriasis 01/07/2012  . Cerebrovascular disease   . Chronic back pain   . Chronic obstructive pulmonary disease (HCC)   . DM type 2 causing complication (HCC) 02/14/2010  . Obesity 02/14/2010  . Arteriosclerotic cardiovascular disease (ASCVD) 02/14/2010  . Hypothyroidism 02/06/2010  . Hyperlipidemia 02/06/2010  . Essential hypertension 02/06/2010  . Sleep apnea 02/06/2010   Past Medical History:  Past Medical History:  Diagnosis Date  . Arthritis   . CAD (coronary artery disease)    Multivessel status post CABG 2001  . Chronic obstructive pulmonary disease (HCC)   . DDD (degenerative disc disease), lumbar   . Diastolic CHF (HCC)   . Essential hypertension   . Hematuria   . History of CVA (cerebrovascular accident)    2013 - lacunar infarct right thalamus - residual left side of  lip numb  and left eye vision worse (which  corrented lens implant from cataract extraction)  . History of MI (myocardial infarction)    July 2001  . Hyperlipidemia   . Hypothyroidism   . Insulin dependent type 2 diabetes mellitus (HCC)   . Lesion of bladder   . Lower urinary tract symptoms (LUTS)   . OSA (obstructive sleep apnea)   . Peripheral neuropathy   . Psoriasis   . SUI (stress urinary incontinence, female)   . Varicose veins    Past Surgical History:  Past Surgical History:  Procedure Laterality Date  . APPENDECTOMY  age 58  . CARDIAC CATHETERIZATION  1991   No sig. cad  . CARDIAC CATHETERIZATION  07-01-2000   high grade in-stent restenosis  . CARDIAC CATHETERIZATION  04-30-2001   single native vessel cad/ graft patent  . CARDIAC CATHETERIZATION  02-13-2006  dr [place] cad with total occluded LAD  and  70% ostial of small ramus/  grafts patent/  normal ef  . CARDIAC CATHETERIZATION  01-20-2000  dr Clifton James   essentially unchanged;  severe single vessel cad with diffuse disease throughout CFX and RCA/ ef 66%;  chf with preserved LVSF  . CARDIOVASCULAR STRESS TEST  08-28-2011   DR ROTHBART   NEGATIVE NUCLEAR STUDY/  GLOBAL LVSF/  EF 65%  . CARPAL TUNNEL RELEASE Bilateral yrs ago  . CATARACT EXTRACTION W/ INTRAOCULAR LENS  IMPLANT, BILATERAL  2013  . CHOLECYSTECTOMY  1990  . COLONOSCOPY N/A 07/26/2015   Procedure: COLONOSCOPY;  Surgeon: Malissa Hippo, MD;  [facility];  Service: Endoscopy;  Laterality: N/A;  1200  . CORONARY ANGIOPLASTY  11-13-1999  balloon angioplasty to mLAD , d2 of LAD  . CORONARY ANGIOPLASTY WITH STENT PLACEMENT  01-17-2000   PCI stenting to mLAD & D2 of LAD  . CORONARY ARTERY BYPASS GRAFT  07-02-2000   DR Tressie Stalker   SVG to diagonal, LIMA to LAD  . CYSTOSCOPY W/ RETROGRADES Bilateral 08/09/2014   Procedure: CYSTOSCOPY, BILATERAL RETROGRADE, HYDRODISTENSION, BLADDER BIOPSY WITH FULGERATION, INSTILL PYRIDIUM AND MARCAINE ;  Surgeon: Anner Crete, MD;  Location: [facility];  Service: Urology;  Laterality: Bilateral;  . KNEE ARTHROSCOPY Left 10-18-2003   SYNOVECTOMY  . LUMBAR SPINE SURGERY  x2    yrs ago  . ORIF HUMERUS FRACTURE Right 04-18-2008  . REPAIR FLEXOR TENDON HAND Right 09-20-2008   CARPI RADIALIS TENDON TRANSFER TO EXTENSOR TO INDEX, MIDDLE, RING , LITTLE FINGERS AND DIGITI MININI  . ROTATOR CUFF REPAIR Right 1995  . TOTAL ABDOMINAL HYSTERECTOMY W/ BILATERAL SALPINGOOPHORECTOMY  age 13  . TRANSTHORACIC ECHOCARDIOGRAM  12-12-2013   MILD LVH/  EF 60-65%/  GRADE I DIASTOLIC DYSFUNCTION/  MILD LAE   HPI:  [name] [name], an 82 y/o female with significant multiple medical issues of previous CVA, hypertention, CAD, diastolic CHF who presented to outside hospital with right-sided facial droop, weakness and intermittent aphasia. She was transferred to [facility] for stroke work-up. MRI was negative for acute intra cranial abnormality, age-related crebral atrophy with moderate chronic small vessel ischemic disease. Patient has cognitive impairments at baseline, noted on speech and language evaluation done 08/28/2018. At that time [facility] speech therapy was recommended tp address cognitive functioning and to implement cognitive strategies for home environment.    Assessment / Plan / Recommendation Clinical Impression  Patient presents with moderate cognitive impairment and consistent with Speech language evaluation on 08/28/2018. MOCA score was 11/30. Score was negatively impacted by patient becoming frustrated with task and stating I can't concentrate with this headache. She was however compliant to finish evaluation. Her grandson was at bedside and agrees with patient that her speech and language have improved since the onset of symptoms yesterday. She continues to have mild dysarthria with reduced intelligibility at the sentence level. Continued memory deficits noted on last admission. Patient is able to effectively communicate in this environment.  Speech therapy to sign off at this time, but recommend home health speech therapy to address cognitive strategies in home environment.     SLP Assessment  SLP Recommendation/Assessment: Patient does not need any further Speech Lanaguage Pathology Services    Follow Up Recommendations  Home health SLP               SLP Evaluation Cognition  Overall Cognitive Status: Within Functional Limits for tasks assessed Orientation Level: Oriented to person;Oriented to place;Oriented to situation;Disoriented to time Attention: Focused Focused Attention: Appears intact Memory: Impaired Memory Impairment: Storage deficit;Retrieval deficit Awareness: Appears intact Problem Solving: Impaired Problem Solving Impairment: Verbal basic Executive Function: Reasoning Reasoning: Appears intact Behaviors: Poor frustration tolerance Safety/Judgment: Appears intact       Comprehension  Auditory Comprehension Overall Auditory Comprehension: Impaired Yes/No Questions: Within Functional Limits Commands: Within Functional Limits Conversation: Simple Interfering Components: Processing speed EffectiveTechniques: Extra processing time;Repetition Visual Recognition/Discrimination Discrimination: Not tested Reading Comprehension Reading Status: Within funtional limits    Expression Expression Primary Mode of Expression: Verbal Verbal Expression Overall Verbal Expression: Appears within functional limits for tasks assessed Initiation: No impairment Level of Generative/Spontaneous Verbalization: Conversation Repetition: No impairment Naming: No impairment Pragmatics: No impairment Interfering Components: Premorbid deficit Written  Expression Dominant Hand: Right Written Expression: Not tested   Oral / Motor  Oral Motor/Sensory Function Overall Oral Motor/Sensory Function: Mild impairment Facial ROM: Reduced right;Reduced left Facial Symmetry: Abnormal symmetry right Facial Strength: Reduced  right Facial Sensation: Reduced right;Reduced left(reports numbness of lower lip) Lingual ROM: Within Functional Limits Lingual Symmetry: Within Functional Limits Lingual Strength: Within Functional Limits Lingual Sensation: Within Functional Limits Motor Speech Overall Motor Speech: Appears within functional limits for tasks assessed Respiration: Within functional limits Phonation: Normal Resonance: Within functional limits Articulation: Impaired Level of Impairment: Sentence Intelligibility: Intelligibility reduced Word: 75-100% accurate Phrase: 75-100% accurate Sentence: 50-74% accurate Conversation: 50-74% accurate Motor Planning: Witnin functional limits Interfering Components: Premorbid status Effective Techniques: Slow rate   GO                    Lindalou Hose Yarelie Hams, MA, CCC-SLP 11/02/2018 11:35 AM

## 2018-11-02 NOTE — Progress Notes (Signed)
Inpatient Diabetes Program Recommendations  AACE/ADA: New Consensus Statement on Inpatient Glycemic Control (2015)  Target Ranges:  Prepandial:   less than 140 mg/dL      Peak postprandial:   less than 180 mg/dL (1-2 hours)      Critically ill patients:  140 - 180 mg/dL    Review of Glycemic Control  Diabetes history: DM 2 Outpatient Diabetes medications: 70/30 32 units BID, Metformin 500 mg BID Current orders for Inpatient glycemic control: 70/30 32 units BID, Metformin 500 mg BID, Novolog 0-9 units tid, Novolog 0-5 units qhs  Also on PO prednisone 20 mg BID  Inpatient Diabetes Program Recommendations:    A1c 9% on 9/22  Consider another A1c to determine glucose control over the past 2-3 months.   Christena Deem RN, MSN, BC-ADM Inpatient Diabetes Coordinator Team Pager [phone] (8a-5p)

## 2018-11-02 NOTE — Progress Notes (Signed)
[facility] PROGRESS NOTE  JAQUELYNN WANAMAKER WUJ:[id number] DOB: 1936/11/06 DOA: 11/01/2018 PCP: Oval Linsey, MD   Narrative: 82 year old female Chronic bronchitis prior CVA remote history of lacunar infarct-hospitalized 08/28/2018 last for decreasing cognition with underlying white matter disease-at that admission and may be a small lacunar infarct--she was also hospitalized 04/2017 with left-sided numbness weakness and received TPA for this-prior right thalamic infarct 2013, possible pontine infarct 07/2016 Severe bipolar Chronic pain with multiple arthritic issues Type 2 diabetes mellitus hyperlipidemia HTN Obesity with OHSS habitus and noncompliant on CPAP Chronic UTIs on suppressive therapy  Patient presented to [facility] with progressive right-sided facial droop and weakness although this seems to be subacute over the past 3 weeks she was initially called as code stroke but then this was canceled patient was sent over to [facility] for neurology eval  Urinalysis showed large leukocytes positive nitrates above 500 glucose few bacteria and hyaline casts BUN/creatinine slightly above baseline 19/0.6 to 10/1.1 hemGlobin 14 MR brain did not confirm any large vessel occlusion Neurology saw patient recommended further work-up patient was admitted  A & Plan Probable multifocal TIAs-work-up being pursued she still has some past pointing on the left side and a little bit of dizziness-defer to neurology-tells me he used to be on full dose aspirin but then was taken down to 81 mg because of coagulopathy and easy bleeding from her skin this will need to be addressed going forward by neurology in terms of dosing of these medications Mild AKI-I would start saline at 50 cc/h 24 hours and reassess-we will hold off on her ACE inhibitor CAD status post CABG 2001-have resumed the patient's amlodipine 5 held lisinopril-aspirin dosing as above Possible UTI on suppressive therapy  at baseline-it does not seem like she has an infectious etiology but she is on suppressive therapy as an outpatient I would continue the same when she is discharged 1 tablet daily Bactrim Obesity OSA without compliance on CPAP-some risk of cardiovascular  events if not using the same Type 2 diabetes mellitus-continue metformin 500 twice daily can use 70/30 lower dose 32 units twice daily-can also continue gabapentin 300 twice daily HTN denies above in terms of meds Chronic bronchitis steroid-dependent-resumed her home dosage of 20 mg twice daily prednisone Bipolar-continue lorazepam 1 mg at bedtime, Celexa 20 daily Chronic pain-can continue OxyIR 1 tab every 4 as needed Obesity-monitor      DVT prophylaxis: Lovenox code Status: Full code family Communication: Family sleeping at the bedside disposition Plan: Pending work-up and as per neurology   Mahala Menghini, MD  [facility] Direct contact: [phone] --Via amion app OR  --[email or web address]; password [email or web address]  7PM-7AM contact night coverage as above 11/02/2018, 7:39 AM  LOS: 0 days   Consultants:  Neurology  Procedures:  No  Antimicrobials:  No  Interval history/Subjective: Awake alert orients fairly well no distress no fever no chills Still feels a little "woozy" and unbalanced  Objective:  Vitals:  Vitals:   11/02/18 0230 11/02/18 0445  BP: (!) 141/83 (!) 142/74  Pulse:    Resp:  20  Temp:  98 F (36.7 C)  SpO2:  99%    Exam:  . Awake pleasant alert no distress NCAT . Smile is asymmetric she has a little bit of twist of the mouth . Finger-nose-finger on the left side is deficient and she passed points . Power is 5/5 . I am not really able to determine nystagmus . Abdomen is soft nontender . Power lower extremities 5/5 .  Reflexes are brisk   I have personally reviewed the following:   Labs:  BUN/creatinine up from baseline of 19/0.6 to 11/1.0  Hemoglobin 12 range  Imaging studies:  MRI reviewed  shows no acute anomaly  Medical tests:  No  Test discussed with performing physician:  y  Decision to obtain old records:  Yes  Review and summation of old records:  y  Scheduled Meds: .  stroke: mapping our early stages of recovery book   Does not apply Once  . amLODipine  5 mg Oral Daily  . citalopram  20 mg Oral Daily  . enoxaparin (LOVENOX) injection  40 mg Subcutaneous Q24H  . gabapentin  300 mg Oral BID  . insulin aspart  0-5 Units Subcutaneous QHS  . insulin aspart  0-9 Units Subcutaneous TID WC  . insulin aspart protamine- aspart  32 Units Subcutaneous BID WC  . levothyroxine  25 mcg Oral QAC breakfast  . LORazepam  1 mg Oral QHS  . metFORMIN  500 mg Oral BID WC  . predniSONE  20 mg Oral BID WC   Continuous Infusions: . sodium chloride      Active Problems:   CVA (cerebral vascular accident) (HCC)   LOS: 0 days

## 2018-11-02 NOTE — Progress Notes (Signed)
Pt off floor to MRA. 

## 2018-11-02 NOTE — Care Management Note (Signed)
Case Management Note  Patient Details  Name: [name] [name] MRN: [id number] Date of Birth: September 02, 1936  Subjective/Objective:    Pt in to r/o CVA. She is from home with her spouse. Per patient, spouse is not able to provide much assistance.  DME: rollator and cane Pt denies issues with medications.  Pt currently drives herself. May need some assistance if unable to drive at d/c.                 Action/Plan: PT recommending HH services. Awaiting OT eval. CM following for d/c orders and further needs.   Expected Discharge Date:                  Expected Discharge Plan:  Home w Home Health Services  In-House Referral:     Discharge planning Services  CM Consult  Post Acute Care Choice:    Choice offered to:     DME Arranged:    DME Agency:     HH Arranged:    HH Agency:     Status of Service:  In process, will continue to follow  If discussed at Long Length of Stay Meetings, dates discussed:    Additional Comments:  Kermit Balo, RN 11/02/2018, 12:37 PM

## 2018-11-02 NOTE — Progress Notes (Addendum)
STROKE TEAM PROGRESS NOTE  HPI:( Dr Otelia Limes ) [name] [name] is an 82 y.o. female who initially presented to the [facility] ED on Sunday afternoon with difficulty speaking, left sided facial droop and balance difficulties. She had been having difficulty walking due to loss of balance and generalized weakness for about one week. Subsequently, on Friday afternoon at 1300, the patient had sudden onset of inability to speak with left sided facial droop. She also noted at that time that she had to think very hard to move her arms and legs. She was brought to the [facility] ED where she was noted to be able to "speak minimally". A Code Stroke was called and Teleneurologist was consulted. CT brain showed no acute intracranial abnormality, with atrophy and small vessel disease noted. After completion of the CT, her speech had improved somewhat and her NIHSS was 2. The patient was not a tPA candidate due to [facility] being > 4.5 hours. She was not a candidate for VIR as her presentation was not suggestive of large vessel occlusive disease. Antiplatelet therapy was recommended.   In September the patient was admitted for a possible TIA or CVA and underwent a work up at that time, with CT head being negative but CTA showing multiple areas of atherosclerotic disease. She also had an MRI during that work up, which was negative for acute stroke.  INTERVAL HISTORY Patient states she is doing well. She had no recurrent symptoms. She had a previous history of TIA in September 2019 with negative stroke workup. Even during the present admission MRI shows no acute stroke and MRA of the brain shows no large vessel stenosis.   Vitals:   11/02/18 0230 11/02/18 0445 11/02/18 0645 11/02/18 0842  BP: (!) 141/83 (!) 142/74 135/85 (!) 154/69  Pulse:    74  Resp:  20 18 18   Temp:  98 F (36.7 C)  98.1 F (36.7 C)  TempSrc:  Oral  Oral  SpO2:  99% 99% 95%  Weight:      Height:        CBC:  Recent Labs  Lab 11/01/18 1519 11/01/18 1528   WBC 9.3  --   NEUTROABS 5.5  --   HGB 12.9 14.6  HCT 43.8 43.0  MCV 81.3  --   PLT 331  --     Basic Metabolic Panel:  Recent Labs  Lab 11/01/18 1519 11/01/18 1528  NA 136 138  K 3.8 3.9  CL 102 101  CO2 25  --   GLUCOSE 242* 243*  BUN 11 10  CREATININE 1.03* 1.10*  CALCIUM 9.5  --    Lipid Panel:     Component Value Date/Time   CHOL 173 08/28/2018 0534   TRIG 94 08/28/2018 0534   HDL 41 08/28/2018 0534   CHOLHDL 4.2 08/28/2018 0534   VLDL 19 08/28/2018 0534   LDLCALC 113 (H) 08/28/2018 0534   HgbA1c:  Lab Results  Component Value Date   HGBA1C 9.0 (H) 09/13/2018   Urine Drug Screen:     Component Value Date/Time   LABOPIA NONE DETECTED 11/01/2018 1815   COCAINSCRNUR NONE DETECTED 11/01/2018 1815   LABBENZ NONE DETECTED 11/01/2018 1815   AMPHETMU NONE DETECTED 11/01/2018 1815   THCU NONE DETECTED 11/01/2018 1815   LABBARB NONE DETECTED 11/01/2018 1815    Alcohol Level     Component Value Date/Time   ETH <10 11/01/2018 1519    IMAGING [name] Contrast  Result Date: 11/02/2018 CLINICAL DATA:  Initial evaluation for progressive right-sided facial droop and weakness. EXAM: MRI HEAD WITHOUT CONTRAST MRA HEAD WITHOUT CONTRAST TECHNIQUE: Multiplanar, multiecho pulse sequences of the brain and surrounding structures were obtained without intravenous contrast. Angiographic images of the head were obtained using MRA technique without contrast. COMPARISON:  Prior CT from 11/01/2018 FINDINGS: MRI HEAD FINDINGS Brain: Examination mildly degraded by motion artifact. Mild diffuse prominence of CSF containing spaces compatible generalized age-related cerebral atrophy. Patchy and confluent T2/FLAIR hyperintensity within the periventricular deep white matter both cerebral hemispheres most consistent with chronic small vessel ischemic disease, moderate nature. Superimposed remote lacunar infarcts present within the bilateral thalami. Few tiny remote left cerebellar  infarcts noted. No abnormal foci of restricted diffusion to suggest acute or subacute ischemia. Gray-white matter differentiation maintained. No encephalomalacia to suggest chronic cortical infarction. No evidence for acute or chronic intracranial hemorrhage. No mass lesion, midline shift or mass effect. No hydrocephalus. No extra-axial fluid collection. Pituitary gland within normal limits. Vascular: Major intracranial vascular flow voids maintained. Skull and upper cervical spine: Craniocervical junction normal. Bone marrow signal intensity within normal limits. Scalp soft tissues unremarkable. Sinuses/Orbits: Patient status post ocular lens replacement bilaterally. Paranasal sinuses are largely clear. Small left mastoid effusion noted, of doubtful significance. Other: None. MRA HEAD FINDINGS ANTERIOR CIRCULATION: Examination mildly degraded by motion artifact. Distal cervical segments of the internal carotid arteries are patent with antegrade flow. Petrous segments widely patent bilaterally. Moderate intracranial atheromatous irregularity throughout the cavernous/supraclinoid ICAs with up to moderate approximate 50% of stenosis on the right. There is a focal severe stenosis at the anterior genu of the cavernous left ICA (series 1030, image 5). Internal carotid arteries are patent to the termini. A1 segments patent bilaterally. Right A1 hypoplastic, accounting for the slightly diminutive right ICA is compared to the left. Superimposed short-segment severe stenosis at the mid right A1 segment (series 1030, image 10). Grossly normal anterior communicating artery. Anterior cerebral arteries grossly patent to their distal aspects without high-grade stenosis, although motion artifact limits evaluation proximally. Diffuse atheromatous irregularity throughout the M1 segments bilaterally without high-grade stenosis. Short-segment mild proximal right M1 stenosis (series 1026, image 10). Normal MCA bifurcations.  Short-segment severe proximal left M2 stenosis, inferior division (series 1030, image 8). Proximal right M2 branches limited in assessment by motion artifact. Extensive small vessel atheromatous irregularity throughout the distal MCA branches bilaterally. POSTERIOR CIRCULATION: Vertebral arteries patent to the vertebrobasilar junction without high-grade stenosis. Vertebral arteries largely co dominant. Posterior inferior cerebral arteries not seen on this examination. Basilar widely patent to its distal aspect without stenosis. Superior cerebral arteries patent bilaterally. Left PCA supplied via the basilar. Predominant fetal type origin of the right PCA. Severe multifocal atheromatous irregularity throughout the right PCA, which does remain patent to its distal aspect. Short-segment moderate to severe distal left P2 stenosis. Extensive small vessel atheromatous irregularity throughout the distal PCA branches bilaterally. No appreciable aneurysm. IMPRESSION: MRI HEAD IMPRESSION: 1. No acute intracranial abnormality. 2. Age-related cerebral atrophy with moderate chronic small vessel ischemic disease. Associated small remote lacunar infarcts involving the bilateral thalami and left cerebellum. MRA HEAD IMPRESSION: 1. Negative intracranial MRA for large vessel occlusion. 2. Moderate to advanced atherosclerotic change throughout the intracranial circulation. Notable findings include short-segment severe cavernous left ICA stenosis, severe right A1 stenosis, severe left M2 stenosis, with moderate to severe multifocal bilateral PCA stenoses. 3. Extensive distal small vessel atheromatous irregularity. Electronically Signed   By: Rise Mu M.D.   On: 11/02/2018 05:05   [name]/brain  Wo Cm  Result Date: 11/02/2018 CLINICAL DATA:  Initial evaluation for progressive right-sided facial droop and weakness. EXAM: MRI HEAD WITHOUT CONTRAST MRA HEAD WITHOUT CONTRAST TECHNIQUE: Multiplanar, multiecho pulse  sequences of the brain and surrounding structures were obtained without intravenous contrast. Angiographic images of the head were obtained using MRA technique without contrast. COMPARISON:  Prior CT from 11/01/2018 FINDINGS: MRI HEAD FINDINGS Brain: Examination mildly degraded by motion artifact. Mild diffuse prominence of CSF containing spaces compatible generalized age-related cerebral atrophy. Patchy and confluent T2/FLAIR hyperintensity within the periventricular deep white matter both cerebral hemispheres most consistent with chronic small vessel ischemic disease, moderate nature. Superimposed remote lacunar infarcts present within the bilateral thalami. Few tiny remote left cerebellar infarcts noted. No abnormal foci of restricted diffusion to suggest acute or subacute ischemia. Gray-white matter differentiation maintained. No encephalomalacia to suggest chronic cortical infarction. No evidence for acute or chronic intracranial hemorrhage. No mass lesion, midline shift or mass effect. No hydrocephalus. No extra-axial fluid collection. Pituitary gland within normal limits. Vascular: Major intracranial vascular flow voids maintained. Skull and upper cervical spine: Craniocervical junction normal. Bone marrow signal intensity within normal limits. Scalp soft tissues unremarkable. Sinuses/Orbits: Patient status post ocular lens replacement bilaterally. Paranasal sinuses are largely clear. Small left mastoid effusion noted, of doubtful significance. Other: None. MRA HEAD FINDINGS ANTERIOR CIRCULATION: Examination mildly degraded by motion artifact. Distal cervical segments of the internal carotid arteries are patent with antegrade flow. Petrous segments widely patent bilaterally. Moderate intracranial atheromatous irregularity throughout the cavernous/supraclinoid ICAs with up to moderate approximate 50% of stenosis on the right. There is a focal severe stenosis at the anterior genu of the cavernous left ICA  (series 1030, image 5). Internal carotid arteries are patent to the termini. A1 segments patent bilaterally. Right A1 hypoplastic, accounting for the slightly diminutive right ICA is compared to the left. Superimposed short-segment severe stenosis at the mid right A1 segment (series 1030, image 10). Grossly normal anterior communicating artery. Anterior cerebral arteries grossly patent to their distal aspects without high-grade stenosis, although motion artifact limits evaluation proximally. Diffuse atheromatous irregularity throughout the M1 segments bilaterally without high-grade stenosis. Short-segment mild proximal right M1 stenosis (series 1026, image 10). Normal MCA bifurcations. Short-segment severe proximal left M2 stenosis, inferior division (series 1030, image 8). Proximal right M2 branches limited in assessment by motion artifact. Extensive small vessel atheromatous irregularity throughout the distal MCA branches bilaterally. POSTERIOR CIRCULATION: Vertebral arteries patent to the vertebrobasilar junction without high-grade stenosis. Vertebral arteries largely co dominant. Posterior inferior cerebral arteries not seen on this examination. Basilar widely patent to its distal aspect without stenosis. Superior cerebral arteries patent bilaterally. Left PCA supplied via the basilar. Predominant fetal type origin of the right PCA. Severe multifocal atheromatous irregularity throughout the right PCA, which does remain patent to its distal aspect. Short-segment moderate to severe distal left P2 stenosis. Extensive small vessel atheromatous irregularity throughout the distal PCA branches bilaterally. No appreciable aneurysm. IMPRESSION: MRI HEAD IMPRESSION: 1. No acute intracranial abnormality. 2. Age-related cerebral atrophy with moderate chronic small vessel ischemic disease. Associated small remote lacunar infarcts involving the bilateral thalami and left cerebellum. MRA HEAD IMPRESSION: 1. Negative  intracranial MRA for large vessel occlusion. 2. Moderate to advanced atherosclerotic change throughout the intracranial circulation. Notable findings include short-segment severe cavernous left ICA stenosis, severe right A1 stenosis, severe left M2 stenosis, with moderate to severe multifocal bilateral PCA stenoses. 3. Extensive distal small vessel atheromatous irregularity. Electronically Signed   By: Janell Quiet.D.  On: 11/02/2018 05:05   Ct Head Code Stroke Wo Contrast  Result Date: 11/01/2018 CLINICAL DATA:  Code stroke. Lost ability to speak at 1 p.m. Balance difficulty today. EXAM: CT HEAD WITHOUT CONTRAST TECHNIQUE: Contiguous axial images were obtained from the base of the skull through the vertex without intravenous contrast. COMPARISON:  08/28/2018 brain MRI. 08/27/2018 CT head and CTA head neck. FINDINGS: Brain: No evidence for acute infarction, hemorrhage, mass lesion, hydrocephalus, or extra-axial fluid. Generalized atrophy. Hypoattenuation of white matter, consistent with small vessel disease. Vascular: Calcification of the cavernous internal carotid arteries consistent with cerebrovascular atherosclerotic disease. No signs of intracranial large vessel occlusion. Skull: Intact Sinuses/Orbits: No acute finding. Other: None ASPECTS ([place] Stroke Program Early CT Score) - Ganglionic level infarction (caudate, lentiform nuclei, internal capsule, insula, M1-M3 cortex): 7 - Supraganglionic infarction (M4-M6 cortex): 3 Total score (0-10 with 10 being normal): 10 IMPRESSION: 1. Atrophy and small vessel disease similar to priors. No acute intracranial findings. 2. ASPECTS is 10 3. These results were called by telephone at the time of interpretation on 11/01/2018 at 3:34 pm to Dr. Vanetta Mulders via Dr. Samuel Jester who verbally acknowledged these results. Electronically Signed   By: Elsie Stain M.D.   On: 11/01/2018 15:39    PHYSICAL EXAM Pleasant elderly Caucasian lady not in  distress. . Afebrile. Head is nontraumatic. Neck is supple without bruit.    Cardiac exam no murmur or gallop. Lungs are clear to auscultation. Distal pulses are well felt. Neurological Exam ;  Awake  Alert oriented x 3. Normal speech and language.eye movements full without nystagmus.fundi were not visualized. Vision acuity and fields appear normal. Hearing is normal. Palatal movements are normal. Face symmetric. Tongue midline. Normal strength, tone, reflexes and coordination. Normal sensation. Gait deferred.    ASSESSMENT/PLAN Ms. DUANE TRIAS is a 82 y.o. female with history of TIA in September 2019 with negative work-up, type 2 diabetes, coronary artery disease and MI status post CABG in 2001, coronary angioplasty with stent placement, diastolic CHF, HTN, arthritis, COPD, lumbar DDD, prior stroke with lacunar infarct of right thalamus in 2013, HLD, hypothyroidism, OSA, peripheral neuropathy, psoriasis presenting to the [facility] ED with difficulty speaking, left-sided facial droop and balance difficulties.   UTI in setting of dehydration.  No acute stroke   Code Stroke CT head No acute stroke. Small vessel disease. Atrophy. ASPECTS 10.     MRI no acute abnormality.  Age-related atrophy.  Small vessel disease.  Old bilateral thalamic and left cerebellar lacunes.  MRA negative for LVO.  Moderate to advanced atherosclerotic changes throughout (severe cavernous L ICA, severe R A1, severe L M2, moderate to severe bilateral PCA).  Atherosclerosis  No need for repeat stroke work-up this admission given recent work-up in September of this year   Lovenox 40 mg sq daily for VTE prophylaxis  aspirin 81 mg daily prior to admission, now on No antithrombotic.  Recommend continuation of aspirin 81 mg daily for secondary stroke prevention at discharge.  Hypertension  Hyperlipidemia  Home meds:  No statin listed  LDL 113 in September, goal < 70  Recommend addition of a statin at  discharge  Diabetes type II  HgbA1c 9.0 in September, goal < 7.0  Uncontrolled  Other Stroke Risk Factors  Advanced age  Obesity, Body mass index is 33.12 kg/m., recommend weight loss, diet and exercise as appropriate   Hx stroke/TIA  Frequent episodes of recurrent stroke symptoms seen by Dr. Gerilyn Pilgrim  08/2018 TIA  04/2017  recrudescence from old right thalamic infarct with subjective left-sided paresthesia s/p tPA  07/2016 suspicious for left pontine infarct  2013 right thalamic lacunar.    Family hx stroke (mother, sister)  Coronary artery disease & MI s/p CABG 2001, coronary angioplasty with stent placement  Chronic diastolic CHF  Obstructive sleep apnea  Other Active Problems  Mild AKI  Chronic bronchitis steroid-dependent  Bipolar  Chronic pain   NOTHING FURTHER TO ADD FROM THE STROKE STANDPOINT Stroke Service will sign off. Please call should any needs arise. Recommend ongoing follow-up with Dr. Dorette Grate day # 0  Annie Main, MSN, APRN, ANVP-BC, AGPCNP-BC Advanced Practice Stroke Nurse [facility] See Amion for Schedule & Pager information 11/02/2018 4:48 PM  I have personally examined this patient, reviewed notes, independently viewed imaging studies, participated in medical decision making and plan of care.ROS completed by me personally and pertinent positives fully documented  I have made any additions or clarifications directly to the above note. Agree with note above.she has presented with transient neurological symptoms in the setting of urinary tract infection and dehydration. Brain imaging is negative for acute stroke. Recommend antiplatelet therapy and follow-up with her neurologist Dr. Gerilyn Pilgrim. Discussed with Dr. Mahala Menghini. Greater than 50% time during this 25 minute visit was spent on counseling and coordination of About a TIA and stroke symptoms and answering questions.  Delia Heady, MD Medical Director [facility]  Stroke Center Pager: [phone] 11/02/2018 6:15 PM  To contact Stroke Continuity provider, please refer to [email or web address]. After hours, contact General Neurology

## 2018-11-02 NOTE — Progress Notes (Signed)
Pt returned to floor from MRA

## 2018-11-02 NOTE — Progress Notes (Signed)
MD [facility] text paged concerning pt's UA results. No new orders received; will continue to closely monitor. Dionne Bucy RN

## 2018-11-02 NOTE — Progress Notes (Signed)
PT Cancellation Note  Patient Details Name: LONITA DEBES MRN: [id number] DOB: [birth year]   Cancelled Treatment:    Reason Eval/Treat Not Completed: Other (comment) attempted to see patient however she was about to receive imaging study. Will attempt to return if time/schedule allow.    Nedra Hai PT, DPT, CBIS  Supplemental Physical Therapist [facility]    Pager [phone] Acute Rehab Office [phone]

## 2018-11-02 NOTE — Progress Notes (Deleted)
Pt's bracelet picked up from short stay; labeled and placed in pt's room. Dionne Bucy RN

## 2018-11-02 NOTE — Progress Notes (Signed)
Pt admitted to 3W22. Welcomed and oriented to unit.

## 2018-11-02 NOTE — Evaluation (Signed)
Physical Therapy Evaluation Patient Details Name: [name] [name] MRN: [id number] DOB: 27-Mar-1936 Today's Date: 11/02/2018   History of Present Illness  82yo female with progressive onset of R facial droop and weakness that has lasted 3 weeks. Intermittent aphasia. CT/MRI negative for acute infarct. PMH COPD, HTN, CVA, MI, hypothyroidism, DM, peripheral neuropathy, cardiac cath, carpal tunnel release, CABG 2001, knee scope, lumbar surgery, RCR   Clinical Impression   Patient received in bed, very pleasant and willing to participate in therapy session. Able to complete functional bed mobility with MinA, and transfers and gait approximately 66f with RW and close min guard due to poor safety awareness and lateral unsteadiness/LOB even with RW. Patient fatigued at end of gait distance and became progressively more dizzy when ambulating, HR WNL during session and O2 remaining at 92% on room air. She was left up in the chair with all needs met, chair alarm active, and RN present and attending. She will continue to benefit from skilled PT services in the acute setting as well as skilled HHPT services moving forward.     Follow Up Recommendations Home health PT    Equipment Recommendations  Rolling walker with 5" wheels;3in1 (PT)    Recommendations for Other Services       Precautions / Restrictions Precautions Precautions: Fall;Other (comment) Precaution Comments: limited activity tolerance at baseline  Restrictions Weight Bearing Restrictions: No      Mobility  Bed Mobility Overal bed mobility: Needs Assistance Bed Mobility: Supine to Sit     Supine to sit: Min assist     General bed mobility comments: MinA to bring trunk up at EOB and scoot hips forward   Transfers Overall transfer level: Needs assistance Equipment used: Rolling walker (2 wheeled) Transfers: Sit to/from Stand Sit to Stand: Min guard         General transfer comment: min guard and repeated attempts, poor  sequencing and safety awareness despite cues from PT   Ambulation/Gait Ambulation/Gait assistance: Min guard Gait Distance (Feet): 35 Feet Assistive device: Rolling walker (2 wheeled) Gait Pattern/deviations: Step-through pattern;Decreased step length - right;Decreased step length - left;Decreased stride length;Drifts right/left;Trunk flexed;Narrow base of support Gait velocity: decreased    General Gait Details: tends to have mild LOB laterally requiring min guard to correct, very slow gait speed and she became dizzy at end of gait distance. SpO2 92% at rest and with activity on room air, HR WNL   Stairs            Wheelchair Mobility    Modified Rankin (Stroke Patients Only)       Balance Overall balance assessment: Needs assistance Sitting-balance support: Bilateral upper extremity supported;Feet supported Sitting balance-Leahy Scale: Fair     Standing balance support: Bilateral upper extremity supported;During functional activity Standing balance-Leahy Scale: Poor Standing balance comment: reliance on B UE support, lateral LOB even with B UE support on walker                              Pertinent Vitals/Pain Pain Assessment: No/denies pain Pain Score: 0-No pain Pain Location: (P) headache Pain Descriptors / Indicators: (P) Aching Pain Intervention(s): Limited activity within patient's tolerance;Monitored during session    Home Living Family/patient expects to be discharged to:: Private residence Living Arrangements: Spouse/significant other;Children Available Help at Discharge: Family;Available 24 hours/day Type of Home: House Home Access: Stairs to enter Entrance Stairs-Rails: Right;Left;Can reach both Entrance Stairs-Number of Steps: 2 Home Layout:  One level Home Equipment: Marine scientist - single point      Prior Function Level of Independence: Independent with assistive device(s)         Comments: community ambulator with SPC PRN,  drives     Hand Dominance   Dominant Hand: Right    Extremity/Trunk Assessment   Upper Extremity Assessment Upper Extremity Assessment: Defer to OT evaluation    Lower Extremity Assessment Lower Extremity Assessment: Generalized weakness    Cervical / Trunk Assessment Cervical / Trunk Assessment: Kyphotic  Communication   Communication: No difficulties  Cognition Arousal/Alertness: Awake/alert Behavior During Therapy: WFL for tasks assessed/performed Overall Cognitive Status: Within Functional Limits for tasks assessed                                        General Comments      Exercises     Assessment/Plan    PT Assessment Patient needs continued PT services  PT Problem List Decreased strength;Decreased mobility;Decreased safety awareness;Decreased coordination;Decreased activity tolerance;Decreased balance       PT Treatment Interventions DME instruction;Therapeutic activities;Gait training;Therapeutic exercise;Patient/family education;Stair training;Balance training;Functional mobility training;Neuromuscular re-education    PT Goals (Current goals can be found in the Care Plan section)  Acute Rehab PT Goals Patient Stated Goal: go home, feel better  PT Goal Formulation: With patient Time For Goal Achievement: 11/16/18 Potential to Achieve Goals: Good    Frequency Min 3X/week   Barriers to discharge        Co-evaluation               AM-PAC PT "6 Clicks" Daily Activity  Outcome Measure Difficulty turning over in bed (including adjusting bedclothes, sheets and blankets)?: A Little Difficulty moving from lying on back to sitting on the side of the bed? : A Little Difficulty sitting down on and standing up from a chair with arms (e.g., wheelchair, bedside commode, etc,.)?: A Little Help needed moving to and from a bed to chair (including a wheelchair)?: A Little Help needed walking in hospital room?: A Little Help needed climbing  3-5 steps with a railing? : A Lot 6 Click Score: 17    End of Session   Activity Tolerance: Patient limited by fatigue Patient left: in chair;with chair alarm set;with call bell/phone within reach;with nursing/sitter in room   PT Visit Diagnosis: Unsteadiness on feet (R26.81);Muscle weakness (generalized) (M62.81);Difficulty in walking, not elsewhere classified (R26.2)    Time: 7837-5423 PT Time Calculation (min) (ACUTE ONLY): 20 min   Charges:   PT Evaluation $PT Eval Moderate Complexity: 1 Mod          Deniece Ree PT, DPT, CBIS  Supplemental Physical Therapist [place]    Pager [phone] Acute Rehab Office [phone]

## 2018-11-03 LAB — BASIC METABOLIC PANEL
Anion gap: 7 (ref 5–15)
BUN: 11 mg/dL (ref 8–23)
CALCIUM: 8.9 mg/dL (ref 8.9–10.3)
CO2: 26 mmol/L (ref 22–32)
CREATININE: 0.93 mg/dL (ref 0.44–1.00)
Chloride: 105 mmol/L (ref 98–111)
GFR calc Af Amer: >60 mL/min (ref >60)
GFR calc non Af Amer: 57 mL/min — ABNORMAL LOW (ref >60)
Glucose, Bld: 211 mg/dL — ABNORMAL HIGH (ref 70–99)
Potassium: 4.3 mmol/L (ref 3.5–5.1)
Sodium: 138 mmol/L (ref 135–145)

## 2018-11-03 LAB — GLUCOSE, CAPILLARY
GLUCOSE-CAPILLARY: 311 mg/dL — AB (ref 70–99)
Glucose-Capillary: 203 mg/dL — ABNORMAL HIGH (ref 70–99)

## 2018-11-03 MED ORDER — ASPIRIN EC 81 MG PO TBEC: 81.0000 mg | DELAYED_RELEASE_TABLET | Freq: Every day | ORAL | 12 refills | Status: DC

## 2018-11-03 MED ORDER — SULFAMETHOXAZOLE-TRIMETHOPRIM 800-160 MG PO TABS: 1.0000 | ORAL_TABLET | Freq: Every day | ORAL | 5 refills | Status: DC

## 2018-11-03 MED ORDER — CEFDINIR 300 MG PO CAPS: 300.0000 mg | ORAL_CAPSULE | Freq: Two times a day (BID) | ORAL | 0 refills | Status: DC

## 2018-11-03 NOTE — Progress Notes (Signed)
PT Cancellation Note  Patient Details Name: [name] [name] MRN: [id number] DOB: [birth year]   Cancelled Treatment:    Reason Eval/Treat Not Completed: Other (comment) attempted to work with patient however she appears to have discharged. Thank you for the opportunity to assist in the care of this patient.    Nedra HaiKristen Unger PT, DPT, CBIS  Supplemental Physical Therapist [facility]    Pager [phone] Acute Rehab Office [phone]

## 2018-11-03 NOTE — Plan of Care (Signed)
Adequate for discharge.

## 2018-11-03 NOTE — Discharge Summary (Signed)
Physician Discharge Summary  [name] [name] WUJ:[id number] DOB: July 06, 1936 DOA: 11/01/2018  PCP: Oval Linsey, MD  Admit date: 11/01/2018 Discharge date: 11/03/2018  Time spent: 25 minutes  Recommendations for Outpatient Follow-up:  1. Reinitiate aspirin 81 mg 2. Continue cefdinir for 2 days and then resume subsequent to completion Bactrim suppressive therapy 3. Needs outpatient follow-up with neurology Dr. Gerilyn Pilgrim  Discharge Diagnoses:  Active Problems:   CVA (cerebral vascular accident) [facility])   Discharge Condition: Improved  Diet recommendation: Heart healthy  Filed Weights   11/01/18 1521 11/02/18 0030  Weight: 88 kg 88.9 kg    History of present illness:  82 year old female Chronic bronchitis prior CVA remote history of lacunar infarct-hospitalized 08/28/2018 last for decreasing cognition with underlying white matter disease-at that admission and may be a small lacunar infarct--she was also hospitalized 04/2017 with left-sided numbness weakness and received TPA for this-prior right thalamic infarct 2013, possible pontine infarct 07/2016 Severe bipolar Chronic pain with multiple arthritic issues Type 2 diabetes mellitus hyperlipidemia HTN Obesity with OHSS habitus and noncompliant on CPAP Chronic UTIs on suppressive therapy  Patient presented to [facility] with progressive right-sided facial droop and weakness although this seems to be subacute over the past 3 weeks she was initially called as code stroke but then this was canceled patient was sent over to [facility] for neurology eval  Urinalysis showed large leukocytes positive nitrates above 500 glucose few bacteria and hyaline casts BUN/creatinine slightly above baseline 19/0.6 to 10/1.1 hemGlobin 14 MR brain did not confirm any large vessel occlusion Neurology saw patient recommended further work-up patient was admitted  Hospital Course:  Probable multifocal TIAs-neurology at this was not  new-tells me he used to be on full dose aspirin but then was taken down to 81 mg because of coagulopathy-she stated to neurologist that she was not taking the same I have prescribed on discharge and she is aware that she needs to continue the same  Mild AKI-I would start saline at 50 cc/h 24 hours and reassess-we will hold off on her ACE inhibitor and this has been discontinued off of her med list she will need consideration for other medications going forward  CAD status post CABG 2001-have resumed the patient's amlodipine 5 held lisinopril-aspirin dosing as above  Possible UTI on suppressive therapy at baseline-urine was collected in a bottle not catheterized-recommend 2 days of empiric treatment with cefepime given her propensity for this-then resume on 11/14 Bactrim suppressive therapy  Obesity OSA without compliance on CPAP-some risk of cardiovascular  events if not using the same  Type 2 diabetes mellitus-continue metformin 500 twice daily can use 70/30 lower dose 32 units twice daily-can also continue gabapentin 300 twice daily sugars were well controlled in hospital-she was resumed on 40 units of 70/30 insulin on discharge  HTN denies above in terms of meds  Chronic bronchitis steroid-dependent-resumed her home dosage of 20 mg twice daily prednisone-I sent during hospital stay  Bipolar-continue lorazepam 1 mg at bedtime, Celexa 20 daily Chronic pain-can continue OxyIR 1 tab every 4 as needed--was given on discharge  Obesity-monitor   Consultations:  Neurology  Discharge Exam: Vitals:   11/03/18 0407 11/03/18 0831  BP: 108/81 (!) 145/91  Pulse: 77 70  Resp: 16 20  Temp: 98.2 F (36.8 C) 98.7 F (37.1 C)  SpO2: 94% 99%    General: Wake alert no distress Cardiovascular: S1-S2 no murmur Respiratory: Chest clear without added sound  neurologically past pointing on the left side but  this appears to be chronic-she has no swelling Thick neck  Discharge  Instructions   Discharge Instructions    Diet - low sodium heart healthy   Complete by:  As directed    Discharge instructions   Complete by:  As directed    You were diagnosed with a possible urinary infection which is likely the cause for your symptoms-I am giving you an antibiotic that is different from your usual 1-you should complete this and once this is completed then you can resume your Bactrim. I would recommend that you get follow-up with your regular physician for work-up of of your further issues-I would also recommend follow-up with Dr. Gerilyn Pilgrimoonquah of neurology you should be taking aspirin 81 mg daily without fail as you have had multiple issues with strokes in the past   Increase activity slowly   Complete by:  As directed      Allergies as of 11/03/2018      Reactions   Codeine Itching      Medication List    STOP taking these medications   lisinopril 10 MG tablet Commonly known as:  PRINIVIL,ZESTRIL     TAKE these medications   amLODipine 5 MG tablet Commonly known as:  NORVASC Take 5 mg by mouth daily.   aspirin EC 81 MG tablet Take 1 tablet (81 mg total) by mouth daily.   cefdinir 300 MG capsule Commonly known as:  OMNICEF Take 1 capsule (300 mg total) by mouth every 12 (twelve) hours.   citalopram 20 MG tablet Commonly known as:  CELEXA Take 1 tablet (20 mg total) by mouth daily.   docusate sodium 100 MG capsule Commonly known as:  COLACE Take 1 capsule (100 mg total) by mouth daily.   gabapentin 300 MG capsule Commonly known as:  NEURONTIN Take 300 mg by mouth 2 (two) times daily.   insulin NPH-regular Human (70-30) 100 UNIT/ML injection Inject 40 Units into the skin 2 (two) times daily.   ipratropium-albuterol 0.5-2.5 (3) MG/3ML Soln Commonly known as:  DUONEB Inhale 3 mLs into the lungs 4 (four) times daily as needed (for shortness of breath).   levothyroxine 25 MCG tablet Commonly known as:  SYNTHROID, LEVOTHROID Take 25 mcg by mouth  daily.   LORazepam 1 MG tablet Commonly known as:  ATIVAN Take 1 tablet (1 mg total) by mouth at bedtime.   metFORMIN 500 MG tablet Commonly known as:  GLUCOPHAGE Take 500 mg by mouth 2 (two) times daily with a meal.   MUCUS RELIEF DM 30-600 MG Tb12 Take 1 tablet by mouth every 12 (twelve) hours.   nitroGLYCERIN 0.4 MG SL tablet Commonly known as:  NITROSTAT Place 0.4 mg under the tongue every 5 (five) minutes as needed for chest pain.   oxyCODONE 5 MG immediate release tablet Commonly known as:  Oxy IR/ROXICODONE Take 1 tablet (5 mg total) by mouth every 4 (four) hours as needed (Moderate pain).   predniSONE 20 MG tablet Commonly known as:  DELTASONE Take 20 mg by mouth 2 (two) times daily with a meal.   sulfamethoxazole-trimethoprim 800-160 MG tablet Commonly known as:  BACTRIM DS,SEPTRA DS Take 1 tablet by mouth daily.   triamcinolone cream 0.1 % Commonly known as:  KENALOG Apply 1 application topically 2 (two) times daily as needed (for flares).      Allergies  Allergen Reactions  . Codeine Itching   Follow-up Information    Beryle Beamsoonquah, Kofi, MD Follow up in 4 week(s).   Specialty:  Neurology Why:  Stroke clinic Contact information: [address] [place] [address] [phone]            The results of significant diagnostics from this hospitalization (including imaging, microbiology, ancillary and laboratory) are listed below for reference.    Significant Diagnostic Studies: [name] Contrast  Result Date: 11/02/2018 CLINICAL DATA:  Initial evaluation for progressive right-sided facial droop and weakness. EXAM: MRI HEAD WITHOUT CONTRAST MRA HEAD WITHOUT CONTRAST TECHNIQUE: Multiplanar, multiecho pulse sequences of the brain and surrounding structures were obtained without intravenous contrast. Angiographic images of the head were obtained using MRA technique without contrast. COMPARISON:  Prior CT from 11/01/2018 FINDINGS: MRI HEAD FINDINGS  Brain: Examination mildly degraded by motion artifact. Mild diffuse prominence of CSF containing spaces compatible generalized age-related cerebral atrophy. Patchy and confluent T2/FLAIR hyperintensity within the periventricular deep white matter both cerebral hemispheres most consistent with chronic small vessel ischemic disease, moderate nature. Superimposed remote lacunar infarcts present within the bilateral thalami. Few tiny remote left cerebellar infarcts noted. No abnormal foci of restricted diffusion to suggest acute or subacute ischemia. Gray-white matter differentiation maintained. No encephalomalacia to suggest chronic cortical infarction. No evidence for acute or chronic intracranial hemorrhage. No mass lesion, midline shift or mass effect. No hydrocephalus. No extra-axial fluid collection. Pituitary gland within normal limits. Vascular: Major intracranial vascular flow voids maintained. Skull and upper cervical spine: Craniocervical junction normal. Bone marrow signal intensity within normal limits. Scalp soft tissues unremarkable. Sinuses/Orbits: Patient status post ocular lens replacement bilaterally. Paranasal sinuses are largely clear. Small left mastoid effusion noted, of doubtful significance. Other: None. MRA HEAD FINDINGS ANTERIOR CIRCULATION: Examination mildly degraded by motion artifact. Distal cervical segments of the internal carotid arteries are patent with antegrade flow. Petrous segments widely patent bilaterally. Moderate intracranial atheromatous irregularity throughout the cavernous/supraclinoid ICAs with up to moderate approximate 50% of stenosis on the right. There is a focal severe stenosis at the anterior genu of the cavernous left ICA (series 1030, image 5). Internal carotid arteries are patent to the termini. A1 segments patent bilaterally. Right A1 hypoplastic, accounting for the slightly diminutive right ICA is compared to the left. Superimposed short-segment severe stenosis  at the mid right A1 segment (series 1030, image 10). Grossly normal anterior communicating artery. Anterior cerebral arteries grossly patent to their distal aspects without high-grade stenosis, although motion artifact limits evaluation proximally. Diffuse atheromatous irregularity throughout the M1 segments bilaterally without high-grade stenosis. Short-segment mild proximal right M1 stenosis (series 1026, image 10). Normal MCA bifurcations. Short-segment severe proximal left M2 stenosis, inferior division (series 1030, image 8). Proximal right M2 branches limited in assessment by motion artifact. Extensive small vessel atheromatous irregularity throughout the distal MCA branches bilaterally. POSTERIOR CIRCULATION: Vertebral arteries patent to the vertebrobasilar junction without high-grade stenosis. Vertebral arteries largely co dominant. Posterior inferior cerebral arteries not seen on this examination. Basilar widely patent to its distal aspect without stenosis. Superior cerebral arteries patent bilaterally. Left PCA supplied via the basilar. Predominant fetal type origin of the right PCA. Severe multifocal atheromatous irregularity throughout the right PCA, which does remain patent to its distal aspect. Short-segment moderate to severe distal left P2 stenosis. Extensive small vessel atheromatous irregularity throughout the distal PCA branches bilaterally. No appreciable aneurysm. IMPRESSION: MRI HEAD IMPRESSION: 1. No acute intracranial abnormality. 2. Age-related cerebral atrophy with moderate chronic small vessel ischemic disease. Associated small remote lacunar infarcts involving the bilateral thalami and left cerebellum. MRA HEAD IMPRESSION: 1. Negative intracranial MRA for large vessel  occlusion. 2. Moderate to advanced atherosclerotic change throughout the intracranial circulation. Notable findings include short-segment severe cavernous left ICA stenosis, severe right A1 stenosis, severe left M2 stenosis,  with moderate to severe multifocal bilateral PCA stenoses. 3. Extensive distal small vessel atheromatous irregularity. Electronically Signed   By: Rise Mu M.D.   On: 11/02/2018 05:05   [name]/brain ZO Cm  Result Date: 11/02/2018 CLINICAL DATA:  Initial evaluation for progressive right-sided facial droop and weakness. EXAM: MRI HEAD WITHOUT CONTRAST MRA HEAD WITHOUT CONTRAST TECHNIQUE: Multiplanar, multiecho pulse sequences of the brain and surrounding structures were obtained without intravenous contrast. Angiographic images of the head were obtained using MRA technique without contrast. COMPARISON:  Prior CT from 11/01/2018 FINDINGS: MRI HEAD FINDINGS Brain: Examination mildly degraded by motion artifact. Mild diffuse prominence of CSF containing spaces compatible generalized age-related cerebral atrophy. Patchy and confluent T2/FLAIR hyperintensity within the periventricular deep white matter both cerebral hemispheres most consistent with chronic small vessel ischemic disease, moderate nature. Superimposed remote lacunar infarcts present within the bilateral thalami. Few tiny remote left cerebellar infarcts noted. No abnormal foci of restricted diffusion to suggest acute or subacute ischemia. Gray-white matter differentiation maintained. No encephalomalacia to suggest chronic cortical infarction. No evidence for acute or chronic intracranial hemorrhage. No mass lesion, midline shift or mass effect. No hydrocephalus. No extra-axial fluid collection. Pituitary gland within normal limits. Vascular: Major intracranial vascular flow voids maintained. Skull and upper cervical spine: Craniocervical junction normal. Bone marrow signal intensity within normal limits. Scalp soft tissues unremarkable. Sinuses/Orbits: Patient status post ocular lens replacement bilaterally. Paranasal sinuses are largely clear. Small left mastoid effusion noted, of doubtful significance. Other: None. MRA HEAD FINDINGS  ANTERIOR CIRCULATION: Examination mildly degraded by motion artifact. Distal cervical segments of the internal carotid arteries are patent with antegrade flow. Petrous segments widely patent bilaterally. Moderate intracranial atheromatous irregularity throughout the cavernous/supraclinoid ICAs with up to moderate approximate 50% of stenosis on the right. There is a focal severe stenosis at the anterior genu of the cavernous left ICA (series 1030, image 5). Internal carotid arteries are patent to the termini. A1 segments patent bilaterally. Right A1 hypoplastic, accounting for the slightly diminutive right ICA is compared to the left. Superimposed short-segment severe stenosis at the mid right A1 segment (series 1030, image 10). Grossly normal anterior communicating artery. Anterior cerebral arteries grossly patent to their distal aspects without high-grade stenosis, although motion artifact limits evaluation proximally. Diffuse atheromatous irregularity throughout the M1 segments bilaterally without high-grade stenosis. Short-segment mild proximal right M1 stenosis (series 1026, image 10). Normal MCA bifurcations. Short-segment severe proximal left M2 stenosis, inferior division (series 1030, image 8). Proximal right M2 branches limited in assessment by motion artifact. Extensive small vessel atheromatous irregularity throughout the distal MCA branches bilaterally. POSTERIOR CIRCULATION: Vertebral arteries patent to the vertebrobasilar junction without high-grade stenosis. Vertebral arteries largely co dominant. Posterior inferior cerebral arteries not seen on this examination. Basilar widely patent to its distal aspect without stenosis. Superior cerebral arteries patent bilaterally. Left PCA supplied via the basilar. Predominant fetal type origin of the right PCA. Severe multifocal atheromatous irregularity throughout the right PCA, which does remain patent to its distal aspect. Short-segment moderate to severe  distal left P2 stenosis. Extensive small vessel atheromatous irregularity throughout the distal PCA branches bilaterally. No appreciable aneurysm. IMPRESSION: MRI HEAD IMPRESSION: 1. No acute intracranial abnormality. 2. Age-related cerebral atrophy with moderate chronic small vessel ischemic disease. Associated small remote lacunar infarcts involving the bilateral thalami and left cerebellum.  MRA HEAD IMPRESSION: 1. Negative intracranial MRA for large vessel occlusion. 2. Moderate to advanced atherosclerotic change throughout the intracranial circulation. Notable findings include short-segment severe cavernous left ICA stenosis, severe right A1 stenosis, severe left M2 stenosis, with moderate to severe multifocal bilateral PCA stenoses. 3. Extensive distal small vessel atheromatous irregularity. Electronically Signed   By: Rise Mu M.D.   On: 11/02/2018 05:05   Ct Head Code Stroke Wo Contrast  Result Date: 11/01/2018 CLINICAL DATA:  Code stroke. Lost ability to speak at 1 p.m. Balance difficulty today. EXAM: CT HEAD WITHOUT CONTRAST TECHNIQUE: Contiguous axial images were obtained from the base of the skull through the vertex without intravenous contrast. COMPARISON:  08/28/2018 brain MRI. 08/27/2018 CT head and CTA head neck. FINDINGS: Brain: No evidence for acute infarction, hemorrhage, mass lesion, hydrocephalus, or extra-axial fluid. Generalized atrophy. Hypoattenuation of white matter, consistent with small vessel disease. Vascular: Calcification of the cavernous internal carotid arteries consistent with cerebrovascular atherosclerotic disease. No signs of intracranial large vessel occlusion. Skull: Intact Sinuses/Orbits: No acute finding. Other: None ASPECTS ([place] Stroke Program Early CT Score) - Ganglionic level infarction (caudate, lentiform nuclei, internal capsule, insula, M1-M3 cortex): 7 - Supraganglionic infarction (M4-M6 cortex): 3 Total score (0-10 with 10 being normal): 10  IMPRESSION: 1. Atrophy and small vessel disease similar to priors. No acute intracranial findings. 2. ASPECTS is 10 3. These results were called by telephone at the time of interpretation on 11/01/2018 at 3:34 pm to Dr. Vanetta Mulders via Dr. Samuel Jester who verbally acknowledged these results. Electronically Signed   By: Elsie Stain M.D.   On: 11/01/2018 15:39    Microbiology: No results found for this or any previous visit (from the past 240 hour(s)).   Labs: Basic Metabolic Panel: Recent Labs  Lab 11/01/18 1519 11/01/18 1528 11/03/18 0434  NA 136 138 138  K 3.8 3.9 4.3  CL 102 101 105  CO2 25  --  26  GLUCOSE 242* 243* 211*  BUN 11 10 11   CREATININE 1.03* 1.10* 0.93  CALCIUM 9.5  --  8.9   Liver Function Tests: Recent Labs  Lab 11/01/18 1519  AST 17  ALT 15  ALKPHOS 70  BILITOT 0.7  PROT 7.4  ALBUMIN 4.0   No results for input(s): LIPASE, AMYLASE in the last 168 hours. No results for input(s): AMMONIA in the last 168 hours. CBC: Recent Labs  Lab 11/01/18 1519 11/01/18 1528  WBC 9.3  --   NEUTROABS 5.5  --   HGB 12.9 14.6  HCT 43.8 43.0  MCV 81.3  --   PLT 331  --    Cardiac Enzymes: No results for input(s): CKTOTAL, CKMB, CKMBINDEX, TROPONINI in the last 168 hours. BNP: BNP (last 3 results) Recent Labs    02/28/18 1708 09/13/18 0026  BNP 72.0 63.0    ProBNP (last 3 results) No results for input(s): PROBNP in the last 8760 hours.  CBG: Recent Labs  Lab 11/02/18 0827 11/02/18 1120 11/02/18 1653 11/02/18 2126 11/03/18 0610  GLUCAP 240* 271* 293* 240* 203*       Signed:  Rhetta Mura MD   [facility] 11/03/2018, 10:47 AM

## 2018-11-03 NOTE — Progress Notes (Signed)
Occupational Therapy Treatment Patient Details Name: [name] AuerRuby J [name] MRN: [id number] DOB: Jan 26, 1936 Today's Date: 11/03/2018    History of present illness 82yo female with progressive onset of R facial droop and weakness that has lasted 3 weeks. Intermittent aphasia. CT/MRI negative for acute infarct. PMH COPD, HTN, CVA, MI, hypothyroidism, DM, peripheral neuropathy, cardiac cath, carpal tunnel release, CABG 2001, knee scope, lumbar surgery, RCR    OT comments  Patient seated in recliner and willing to participate in OT session.  Patient requires cueing throughout session for safety awareness with mobility and walker mgmt, slight impulsivity due to pain.  Demonstrates ability to complete transfers, mobility and grooming tasks with supervision.  Educated and provided handout on using 3:1 as shower chair, therapist demonstrated but pt declined due to back pain.  Patient agreeable to have assistance with tub transfers at dc.  Continue to recommend 24/7 supervision and HHOT.  Will continue to follow.    Follow Up Recommendations  Home health OT;Supervision/Assistance - 24 hour    Equipment Recommendations  3 in 1 bedside commode    Recommendations for Other Services      Precautions / Restrictions Precautions Precautions: Fall;Other (comment) Precaution Comments: limited activity tolerance at baseline  Restrictions Weight Bearing Restrictions: No       Mobility Bed Mobility               General bed mobility comments: seated OOB in recliner   Transfers Overall transfer level: Needs assistance Equipment used: Rolling walker (2 wheeled) Transfers: Sit to/from Stand Sit to Stand: Supervision         General transfer comment: supervision for safety and balance, cueing for hand placement/safety awareness with poor carryover     Balance Overall balance assessment: Needs assistance Sitting-balance support: No upper extremity supported;Feet supported Sitting balance-Leahy  Scale: Good     Standing balance support: During functional activity;No upper extremity supported Standing balance-Leahy Scale: Poor Standing balance comment: able to complete grooming with 0 hand support at sink, but reliant on B UE during dynamic tasks                           ADL either performed or assessed with clinical judgement   ADL Overall ADL's : Needs assistance/impaired     Grooming: Supervision/safety;Standing;Wash/dry hands                   Toilet Transfer: Supervision/safety;Ambulation;RW Toilet Transfer Details (indicate cue type and reason): simulated to recliner, close supervision for safety        Tub/Shower Transfer Details (indicate cue type and reason): pt educated on technique and provided handout on using 3:1 as shower chair and safe transfer using RW; therapist demonstrated technique; pt agreeable to have assist at home  Functional mobility during ADLs: Supervision/safety;Rolling walker General ADL Comments: limited due to back pain      Vision       Perception     Praxis      Cognition Arousal/Alertness: Awake/alert Behavior During Therapy: WFL for tasks assessed/performed Overall Cognitive Status: Within Functional Limits for tasks assessed                                 General Comments: pt overall WFL, but noted some decreased safety awareness and impulsivity due to back pain         Exercises     Shoulder  Instructions       General Comments      Pertinent Vitals/ Pain       Pain Assessment: Faces Faces Pain Scale: Hurts whole lot Pain Location: back Pain Descriptors / Indicators: Aching;Headache;Sore Pain Intervention(s): Limited activity within patient's tolerance;Repositioned;Other (comment)(RN aware)  Home Living                                          Prior Functioning/Environment              Frequency  Min 2X/week        Progress Toward Goals  OT  Goals(current goals can now be found in the care plan section)  Progress towards OT goals: Progressing toward goals  Acute Rehab OT Goals Patient Stated Goal: go home, feel better  OT Goal Formulation: With patient Time For Goal Achievement: 11/16/18 Potential to Achieve Goals: Good  Plan Discharge plan remains appropriate;Frequency remains appropriate    Co-evaluation                 AM-PAC PT "6 Clicks" Daily Activity     Outcome Measure   Help from another person eating meals?: None Help from another person taking care of personal grooming?: None Help from another person toileting, which includes using toliet, bedpan, or urinal?: A Little Help from another person bathing (including washing, rinsing, drying)?: A Little Help from another person to put on and taking off regular upper body clothing?: A Little Help from another person to put on and taking off regular lower body clothing?: A Lot 6 Click Score: 19    End of Session Equipment Utilized During Treatment: Gait belt;Rolling walker  OT Visit Diagnosis: Unsteadiness on feet (R26.81);Muscle weakness (generalized) (M62.81);Other symptoms and signs involving the nervous system (R29.898)   Activity Tolerance Patient limited by pain   Patient Left in chair;with call bell/phone within reach;with nursing/sitter in room   Nurse Communication Mobility status        Time: 1209-1225 OT Time Calculation (min): 16 min  Charges: OT General Charges $OT Visit: 1 Visit OT Treatments $Self Care/Home Management : 8-22 mins  Chancy Milroy, OT Acute Rehabilitation Services Pager [phone] Office [phone]    Chancy Milroy 11/03/2018, 1:06 PM

## 2018-11-03 NOTE — Care Management Note (Signed)
Case Management Note  Patient Details  Name: [name] [name] MRN: [id number] Date of Birth: 06/24/1936  Subjective/Objective:                    Action/Plan: Pt discharging home with [facility]. CM provided her choice and she selected [facility]. [name] notified and accepted the referral.  Pt with orders for walker and 3 in 1. [name] notified and will deliver to the room. Pt states she has transportation home.  Expected Discharge Date:  11/03/18               Expected Discharge Plan:  Home w Home Health Services  In-House Referral:     Discharge planning Services  CM Consult  Post Acute Care Choice:  Durable Medical Equipment, Home Health Choice offered to:  Patient  DME Arranged:  3-N-1, Walker rolling DME Agency:  [facility]  HH [facility]  [facility]  Status of Service:  Completed, signed off  If discussed at Long Length of Stay Meetings, dates discussed:    Additional Comments:  Kermit BaloKelli F Neve Branscomb, RN 11/03/2018, 12:33 PM

## 2018-12-10 ENCOUNTER — Other Ambulatory Visit (HOSPITAL_COMMUNITY): Payer: Self-pay | Admitting: Family Medicine

## 2018-12-10 DIAGNOSIS — M545 Low back pain, unspecified: Secondary | ICD-10-CM

## 2018-12-13 ENCOUNTER — Emergency Department (HOSPITAL_COMMUNITY): Payer: Medicare HMO

## 2018-12-13 ENCOUNTER — Encounter (HOSPITAL_COMMUNITY): Payer: Self-pay | Admitting: *Deleted

## 2018-12-13 ENCOUNTER — Inpatient Hospital Stay (HOSPITAL_COMMUNITY)
Admission: EM | Admit: 2018-12-13 | Discharge: 2018-12-17 | DRG: 190 | Disposition: A | Discharge status: Home / Self Care | Payer: Medicare HMO | Attending: Internal Medicine | Admitting: Internal Medicine

## 2018-12-13 ENCOUNTER — Other Ambulatory Visit: Payer: Self-pay

## 2018-12-13 DIAGNOSIS — J9601 Acute respiratory failure with hypoxia: Secondary | ICD-10-CM | POA: Diagnosis present

## 2018-12-13 DIAGNOSIS — E871 Hypo-osmolality and hyponatremia: Secondary | ICD-10-CM | POA: Diagnosis present

## 2018-12-13 DIAGNOSIS — Z79899 Other long term (current) drug therapy: Secondary | ICD-10-CM

## 2018-12-13 DIAGNOSIS — E118 Type 2 diabetes mellitus with unspecified complications: Secondary | ICD-10-CM | POA: Diagnosis present

## 2018-12-13 DIAGNOSIS — G9341 Metabolic encephalopathy: Secondary | ICD-10-CM | POA: Diagnosis present

## 2018-12-13 DIAGNOSIS — E785 Hyperlipidemia, unspecified: Secondary | ICD-10-CM | POA: Diagnosis present

## 2018-12-13 DIAGNOSIS — J441 Chronic obstructive pulmonary disease with (acute) exacerbation: Principal | ICD-10-CM | POA: Diagnosis present

## 2018-12-13 DIAGNOSIS — E872 Acidosis: Secondary | ICD-10-CM | POA: Diagnosis present

## 2018-12-13 DIAGNOSIS — E875 Hyperkalemia: Secondary | ICD-10-CM | POA: Diagnosis present

## 2018-12-13 DIAGNOSIS — N179 Acute kidney failure, unspecified: Secondary | ICD-10-CM | POA: Diagnosis present

## 2018-12-13 DIAGNOSIS — Z951 Presence of aortocoronary bypass graft: Secondary | ICD-10-CM | POA: Diagnosis not present

## 2018-12-13 DIAGNOSIS — Z7982 Long term (current) use of aspirin: Secondary | ICD-10-CM

## 2018-12-13 DIAGNOSIS — N390 Urinary tract infection, site not specified: Secondary | ICD-10-CM | POA: Diagnosis present

## 2018-12-13 DIAGNOSIS — I2510 Atherosclerotic heart disease of native coronary artery without angina pectoris: Secondary | ICD-10-CM | POA: Diagnosis present

## 2018-12-13 DIAGNOSIS — Z8673 Personal history of transient ischemic attack (TIA), and cerebral infarction without residual deficits: Secondary | ICD-10-CM | POA: Diagnosis not present

## 2018-12-13 DIAGNOSIS — E669 Obesity, unspecified: Secondary | ICD-10-CM | POA: Diagnosis present

## 2018-12-13 DIAGNOSIS — I110 Hypertensive heart disease with heart failure: Secondary | ICD-10-CM | POA: Diagnosis present

## 2018-12-13 DIAGNOSIS — E039 Hypothyroidism, unspecified: Secondary | ICD-10-CM | POA: Diagnosis present

## 2018-12-13 DIAGNOSIS — I10 Essential (primary) hypertension: Secondary | ICD-10-CM | POA: Diagnosis present

## 2018-12-13 DIAGNOSIS — Z79890 Hormone replacement therapy (postmenopausal): Secondary | ICD-10-CM

## 2018-12-13 DIAGNOSIS — Z9119 Patient's noncompliance with other medical treatment and regimen: Secondary | ICD-10-CM | POA: Diagnosis not present

## 2018-12-13 DIAGNOSIS — Z7951 Long term (current) use of inhaled steroids: Secondary | ICD-10-CM | POA: Diagnosis not present

## 2018-12-13 DIAGNOSIS — I5032 Chronic diastolic (congestive) heart failure: Secondary | ICD-10-CM | POA: Diagnosis present

## 2018-12-13 DIAGNOSIS — Z9049 Acquired absence of other specified parts of digestive tract: Secondary | ICD-10-CM | POA: Diagnosis not present

## 2018-12-13 DIAGNOSIS — E1165 Type 2 diabetes mellitus with hyperglycemia: Secondary | ICD-10-CM | POA: Diagnosis present

## 2018-12-13 DIAGNOSIS — Z794 Long term (current) use of insulin: Secondary | ICD-10-CM | POA: Diagnosis not present

## 2018-12-13 DIAGNOSIS — Z7952 Long term (current) use of systemic steroids: Secondary | ICD-10-CM | POA: Diagnosis not present

## 2018-12-13 DIAGNOSIS — I252 Old myocardial infarction: Secondary | ICD-10-CM | POA: Diagnosis not present

## 2018-12-13 LAB — BLOOD GAS, ARTERIAL
ACID-BASE DEFICIT: 1.3 mmol/L (ref 0.0–2.0)
BICARBONATE: 22.7 mmol/L (ref 20.0–28.0)
DRAWN BY: 234301
O2 CONTENT: 3 L/min
O2 SAT: 95.3 %
PATIENT TEMPERATURE: 37
PO2 ART: 87.7 mmHg (ref 83.0–108.0)
pCO2 arterial: 53.7 mmHg — ABNORMAL HIGH (ref 32.0–48.0)
pH, Arterial: 7.282 — ABNORMAL LOW (ref 7.350–7.450)

## 2018-12-13 LAB — CBC WITH DIFFERENTIAL/PLATELET
Abs Immature Granulocytes: 0.05 10*3/uL (ref 0.00–0.07)
Basophils Absolute: 0.1 10*3/uL (ref 0.0–0.1)
Basophils Relative: 0 %
EOS ABS: 0.8 10*3/uL — AB (ref 0.0–0.5)
EOS PCT: 7 %
HEMATOCRIT: 39.8 % (ref 36.0–46.0)
HEMOGLOBIN: 11.5 g/dL — AB (ref 12.0–15.0)
IMMATURE GRANULOCYTES: 0 %
LYMPHS ABS: 2.6 10*3/uL (ref 0.7–4.0)
LYMPHS PCT: 21 %
MCH: 24.4 pg — ABNORMAL LOW (ref 26.0–34.0)
MCHC: 28.9 g/dL — ABNORMAL LOW (ref 30.0–36.0)
MCV: 84.5 fL (ref 80.0–100.0)
MONOS PCT: 10 %
Monocytes Absolute: 1.2 10*3/uL — ABNORMAL HIGH (ref 0.1–1.0)
Neutro Abs: 7.7 10*3/uL (ref 1.7–7.7)
Neutrophils Relative %: 62 %
Platelets: 306 10*3/uL (ref 150–400)
RBC: 4.71 MIL/uL (ref 3.87–5.11)
RDW: 16.4 % — AB (ref 11.5–15.5)
WBC: 12.5 10*3/uL — ABNORMAL HIGH (ref 4.0–10.5)
nRBC: 0 % (ref 0.0–0.2)

## 2018-12-13 LAB — BASIC METABOLIC PANEL
Anion gap: 7 (ref 5–15)
BUN: 18 mg/dL (ref 8–23)
CALCIUM: 9.1 mg/dL (ref 8.9–10.3)
CO2: 24 mmol/L (ref 22–32)
CREATININE: 1.33 mg/dL — AB (ref 0.44–1.00)
Chloride: 105 mmol/L (ref 98–111)
GFR calc Af Amer: 44 mL/min — ABNORMAL LOW (ref >60)
GFR calc non Af Amer: 38 mL/min — ABNORMAL LOW (ref >60)
GLUCOSE: 201 mg/dL — AB (ref 70–99)
Potassium: 4.2 mmol/L (ref 3.5–5.1)
Sodium: 136 mmol/L (ref 135–145)

## 2018-12-13 LAB — INFLUENZA PANEL BY PCR (TYPE A & B)
Influenza A By PCR: NEGATIVE
Influenza B By PCR: NEGATIVE

## 2018-12-13 LAB — GLUCOSE, CAPILLARY
Glucose-Capillary: 235 mg/dL — ABNORMAL HIGH (ref 70–99)
Glucose-Capillary: 281 mg/dL — ABNORMAL HIGH (ref 70–99)
Glucose-Capillary: 296 mg/dL — ABNORMAL HIGH (ref 70–99)
Glucose-Capillary: 429 mg/dL — ABNORMAL HIGH (ref 70–99)
Glucose-Capillary: 452 mg/dL — ABNORMAL HIGH (ref 70–99)

## 2018-12-13 LAB — BRAIN NATRIURETIC PEPTIDE: B Natriuretic Peptide: 475 pg/mL — ABNORMAL HIGH (ref 0.0–100.0)

## 2018-12-13 LAB — TSH: TSH: 2.306 u[IU]/mL (ref 0.350–4.500)

## 2018-12-13 LAB — MRSA PCR SCREENING: MRSA by PCR: NEGATIVE

## 2018-12-13 MED ORDER — CITALOPRAM HYDROBROMIDE 20 MG PO TABS
20.0000 mg | ORAL_TABLET | Freq: Every day | ORAL | Status: DC
  Administered 2018-12-13 – 2018-12-17 (×5): 20 mg via ORAL
  Filled 2018-12-13 (×6): qty 1

## 2018-12-13 MED ORDER — DOXYCYCLINE HYCLATE 100 MG PO TABS
100.0000 mg | ORAL_TABLET | Freq: Two times a day (BID) | ORAL | Status: DC
  Administered 2018-12-13 – 2018-12-17 (×9): 100 mg via ORAL
  Filled 2018-12-13 (×9): qty 1

## 2018-12-13 MED ORDER — ACETAMINOPHEN 650 MG RE SUPP: 650.0000 mg | Freq: Four times a day (QID) | RECTAL | Status: DC | PRN

## 2018-12-13 MED ORDER — HYDROCODONE-ACETAMINOPHEN 5-325 MG PO TABS
1.0000 | ORAL_TABLET | Freq: Once | ORAL | Status: AC
  Administered 2018-12-13: 1 via ORAL
  Filled 2018-12-13: qty 1

## 2018-12-13 MED ORDER — PREDNISONE 50 MG PO TABS
60.0000 mg | ORAL_TABLET | Freq: Once | ORAL | Status: AC
  Administered 2018-12-13: 60 mg via ORAL
  Filled 2018-12-13: qty 1

## 2018-12-13 MED ORDER — INSULIN ASPART 100 UNIT/ML ~~LOC~~ SOLN: 0.0000 [IU] | Freq: Every day | SUBCUTANEOUS | Status: DC

## 2018-12-13 MED ORDER — ALBUTEROL (5 MG/ML) CONTINUOUS INHALATION SOLN
10.0000 mg/h | INHALATION_SOLUTION | Freq: Once | RESPIRATORY_TRACT | Status: AC
  Administered 2018-12-13: 10 mg/h via RESPIRATORY_TRACT
  Filled 2018-12-13: qty 20

## 2018-12-13 MED ORDER — HEPARIN SODIUM (PORCINE) 5000 UNIT/ML IJ SOLN
5000.0000 [IU] | Freq: Three times a day (TID) | INTRAMUSCULAR | Status: DC
  Administered 2018-12-13 – 2018-12-17 (×13): 5000 [IU] via SUBCUTANEOUS
  Filled 2018-12-13 (×13): qty 1

## 2018-12-13 MED ORDER — ONDANSETRON HCL 4 MG PO TABS: 4.0000 mg | ORAL_TABLET | Freq: Four times a day (QID) | ORAL | Status: DC | PRN

## 2018-12-13 MED ORDER — ACETAMINOPHEN 325 MG PO TABS
650.0000 mg | ORAL_TABLET | Freq: Four times a day (QID) | ORAL | Status: DC | PRN
  Administered 2018-12-15 – 2018-12-16 (×2): 650 mg via ORAL
  Filled 2018-12-13 (×2): qty 2

## 2018-12-13 MED ORDER — METHYLPREDNISOLONE SODIUM SUCC 125 MG IJ SOLR
60.0000 mg | Freq: Two times a day (BID) | INTRAMUSCULAR | Status: DC
  Administered 2018-12-13 – 2018-12-14 (×2): 60 mg via INTRAVENOUS
  Filled 2018-12-13 (×2): qty 2

## 2018-12-13 MED ORDER — FUROSEMIDE 10 MG/ML IJ SOLN
40.0000 mg | Freq: Once | INTRAMUSCULAR | Status: AC
  Administered 2018-12-13: 40 mg via INTRAVENOUS
  Filled 2018-12-13: qty 4

## 2018-12-13 MED ORDER — INSULIN ASPART 100 UNIT/ML ~~LOC~~ SOLN
10.0000 [IU] | Freq: Once | SUBCUTANEOUS | Status: AC
  Administered 2018-12-13: 10 [IU] via SUBCUTANEOUS

## 2018-12-13 MED ORDER — LORAZEPAM 1 MG PO TABS
1.0000 mg | ORAL_TABLET | Freq: Two times a day (BID) | ORAL | Status: DC | PRN
  Administered 2018-12-13 – 2018-12-14 (×2): 1 mg via ORAL
  Filled 2018-12-13 (×2): qty 1

## 2018-12-13 MED ORDER — SODIUM CHLORIDE 0.9% FLUSH: 3.0000 mL | INTRAVENOUS | Status: DC | PRN

## 2018-12-13 MED ORDER — IPRATROPIUM-ALBUTEROL 0.5-2.5 (3) MG/3ML IN SOLN
3.0000 mL | RESPIRATORY_TRACT | Status: DC
  Administered 2018-12-13 – 2018-12-14 (×4): 3 mL via RESPIRATORY_TRACT
  Filled 2018-12-13 (×4): qty 3

## 2018-12-13 MED ORDER — ONDANSETRON HCL 4 MG/2ML IJ SOLN: 4.0000 mg | Freq: Four times a day (QID) | INTRAMUSCULAR | Status: DC | PRN

## 2018-12-13 MED ORDER — OXYCODONE HCL 5 MG PO TABS
5.0000 mg | ORAL_TABLET | ORAL | Status: DC | PRN
  Administered 2018-12-13 – 2018-12-14 (×2): 5 mg via ORAL
  Filled 2018-12-13 (×2): qty 1

## 2018-12-13 MED ORDER — SODIUM CHLORIDE 0.9% FLUSH
3.0000 mL | Freq: Two times a day (BID) | INTRAVENOUS | Status: DC
  Administered 2018-12-13 – 2018-12-17 (×7): 3 mL via INTRAVENOUS

## 2018-12-13 MED ORDER — INSULIN ASPART 100 UNIT/ML ~~LOC~~ SOLN
0.0000 [IU] | SUBCUTANEOUS | Status: DC
  Administered 2018-12-13 (×2): 11 [IU] via SUBCUTANEOUS
  Administered 2018-12-14: 15 [IU] via SUBCUTANEOUS
  Administered 2018-12-14: 4 [IU] via SUBCUTANEOUS
  Administered 2018-12-14: 20 [IU] via SUBCUTANEOUS
  Administered 2018-12-14 (×2): 7 [IU] via SUBCUTANEOUS
  Administered 2018-12-15: 3 [IU] via SUBCUTANEOUS
  Administered 2018-12-15: 11 [IU] via SUBCUTANEOUS
  Administered 2018-12-15: 4 [IU] via SUBCUTANEOUS
  Administered 2018-12-15: 15 [IU] via SUBCUTANEOUS
  Administered 2018-12-15: 4 [IU] via SUBCUTANEOUS
  Administered 2018-12-15: 3 [IU] via SUBCUTANEOUS
  Administered 2018-12-16: 4 [IU] via SUBCUTANEOUS
  Administered 2018-12-16: 20 [IU] via SUBCUTANEOUS
  Administered 2018-12-16 (×2): 15 [IU] via SUBCUTANEOUS
  Administered 2018-12-16 – 2018-12-17 (×2): 4 [IU] via SUBCUTANEOUS
  Administered 2018-12-17: 11 [IU] via SUBCUTANEOUS
  Administered 2018-12-17: 4 [IU] via SUBCUTANEOUS
  Administered 2018-12-17: 20 [IU] via SUBCUTANEOUS

## 2018-12-13 MED ORDER — ORAL CARE MOUTH RINSE
15.0000 mL | Freq: Two times a day (BID) | OROMUCOSAL | Status: DC
  Administered 2018-12-13 – 2018-12-16 (×8): 15 mL via OROMUCOSAL

## 2018-12-13 MED ORDER — LEVOTHYROXINE SODIUM 25 MCG PO TABS
25.0000 ug | ORAL_TABLET | Freq: Every day | ORAL | Status: DC
  Administered 2018-12-14 – 2018-12-17 (×3): 25 ug via ORAL
  Filled 2018-12-13 (×3): qty 1

## 2018-12-13 MED ORDER — AMLODIPINE BESYLATE 5 MG PO TABS
5.0000 mg | ORAL_TABLET | Freq: Every day | ORAL | Status: DC
  Administered 2018-12-13 – 2018-12-17 (×5): 5 mg via ORAL
  Filled 2018-12-13 (×6): qty 1

## 2018-12-13 MED ORDER — CHLORHEXIDINE GLUCONATE 0.12 % MT SOLN
15.0000 mL | Freq: Two times a day (BID) | OROMUCOSAL | Status: DC
  Administered 2018-12-13 – 2018-12-17 (×8): 15 mL via OROMUCOSAL
  Filled 2018-12-13 (×9): qty 15

## 2018-12-13 MED ORDER — IPRATROPIUM-ALBUTEROL 0.5-2.5 (3) MG/3ML IN SOLN
3.0000 mL | Freq: Once | RESPIRATORY_TRACT | Status: AC
  Administered 2018-12-13: 3 mL via RESPIRATORY_TRACT
  Filled 2018-12-13: qty 3

## 2018-12-13 MED ORDER — SODIUM CHLORIDE 0.9 % IV SOLN: 250.0000 mL | INTRAVENOUS | Status: DC | PRN

## 2018-12-13 MED ORDER — ASPIRIN EC 81 MG PO TBEC
81.0000 mg | DELAYED_RELEASE_TABLET | Freq: Every day | ORAL | Status: DC
  Administered 2018-12-13 – 2018-12-17 (×5): 81 mg via ORAL
  Filled 2018-12-13 (×6): qty 1

## 2018-12-13 NOTE — ED Triage Notes (Signed)
Pt c/o cough, congestion, wheezing that started yesterday, left hip pain that has gotten worse, denies any injury,

## 2018-12-13 NOTE — H&P (Signed)
History and Physical    [name] [name]:[id number] DOB: August 08, 1936 DOA: 12/13/2018  PCP: Oval Linsey, MD   Patient coming from: Home  Chief Complaint:  Cough and dyspnea  HPI: [name] [name] is a 82 y.o. female with medical history significant for COPD without home oxygen, but with noted chronic steroid use, CAD, OSA with noncompliance on home CPAP machine, grade 1 diastolic dysfunction with LVEF 60 to 65%, dyslipidemia, hypothyroidism, insulin-dependent type 2 diabetes, and obesity who presented to the emergency department with a nonproductive cough as well as shortness of breath that has been worsening over the last 1 to 2 days.  She has tried breathing treatments at home with minimal relief of her symptoms and denies any lower extremity edema or weight gain.  She denies any fevers or chills at home.  No chest pain noted.   ED Course: Vital signs were stable and patient is on 2 L nasal cannula to maintain oxygen saturations in the 90th percentile.  Laboratory data with leukocytosis of 12,500 and creatinine 1.33 with baseline near 1.  Blood pressure mildly elevated as well as glucose of 201.  1 view chest x-ray with some mild cardiomegaly and pulmonary vascular congestion noted BNP is over 400.  EKG with no acute findings.  She has been given continuous nebulizer treatment as well as oral prednisone with minimal improvement.  She had arrived to the floor with ongoing lethargy and ABG demonstrates some respiratory acidosis.  Patient is generally noncompliant with her home CPAP machine.  Review of Systems: Cannot be fully obtained given patient condition.  Past Medical History:  Diagnosis Date  . Arthritis   . CAD (coronary artery disease)    Multivessel status post CABG 2001  . Chronic obstructive pulmonary disease (HCC)   . DDD (degenerative disc disease), lumbar   . Diastolic CHF (HCC)   . Essential hypertension   . Hematuria   . History of CVA (cerebrovascular accident)    2013 - lacunar infarct right thalamus - residual left side of  lip numb  and left eye vision worse (which corrented lens implant from cataract extraction)  . History of MI (myocardial infarction)    July 2001  . Hyperlipidemia   . Hypothyroidism   . Insulin dependent type 2 diabetes mellitus (HCC)   . Lesion of bladder   . Lower urinary tract symptoms (LUTS)   . OSA (obstructive sleep apnea)   . Peripheral neuropathy   . Psoriasis   . SUI (stress urinary incontinence, female)   . Varicose veins     Past Surgical History:  Procedure Laterality Date  . APPENDECTOMY  age 49  . CARDIAC CATHETERIZATION  1991   No sig. cad  . CARDIAC CATHETERIZATION  07-01-2000   high grade in-stent restenosis  . CARDIAC CATHETERIZATION  04-30-2001   single native vessel cad/ graft patent  . CARDIAC CATHETERIZATION  02-13-2006  dr [place] cad with total occluded LAD  and  70% ostial of small ramus/  grafts patent/  normal ef  . CARDIAC CATHETERIZATION  01-20-2000  dr Clifton James   essentially unchanged;  severe single vessel cad with diffuse disease throughout CFX and RCA/ ef 66%;  chf with preserved LVSF  . CARDIOVASCULAR STRESS TEST  08-28-2011   DR ROTHBART   NEGATIVE NUCLEAR STUDY/  GLOBAL LVSF/  EF 65%  . CARPAL TUNNEL RELEASE Bilateral yrs ago  . CATARACT EXTRACTION W/ INTRAOCULAR LENS  IMPLANT, BILATERAL  2013  . CHOLECYSTECTOMY  1990  . COLONOSCOPY N/A 07/26/2015   Procedure: COLONOSCOPY;  Surgeon: Malissa HippoNajeeb U Rehman, MD;  Location[facility];  Service: Endoscopy;  Laterality: N/A;  1200  . CORONARY ANGIOPLASTY  11-13-1999   balloon angioplasty to mLAD , d2 of LAD  . CORONARY ANGIOPLASTY WITH STENT PLACEMENT  01-17-2000   PCI stenting to mLAD & D2 of LAD  . CORONARY ARTERY BYPASS GRAFT  07-02-2000   DR Tressie StalkerLARENCE OWEN   SVG to diagonal, LIMA to LAD  . CYSTOSCOPY W/ RETROGRADES Bilateral 08/09/2014   Procedure: CYSTOSCOPY, BILATERAL RETROGRADE, HYDRODISTENSION, BLADDER BIOPSY WITH FULGERATION,  INSTILL PYRIDIUM AND MARCAINE ;  Surgeon: Anner CreteJohn J Wrenn, MD;  [facility] [place];  Service: Urology;  Laterality: Bilateral;  . KNEE ARTHROSCOPY Left 10-18-2003   SYNOVECTOMY  . LUMBAR SPINE SURGERY  x2    yrs ago  . ORIF HUMERUS FRACTURE Right 04-18-2008  . REPAIR FLEXOR TENDON HAND Right 09-20-2008   CARPI RADIALIS TENDON TRANSFER TO EXTENSOR TO INDEX, MIDDLE, RING , LITTLE FINGERS AND DIGITI MININI  . ROTATOR CUFF REPAIR Right 1995  . TOTAL ABDOMINAL HYSTERECTOMY W/ BILATERAL SALPINGOOPHORECTOMY  age 82  . TRANSTHORACIC ECHOCARDIOGRAM  12-12-2013   MILD LVH/  EF 60-65%/  GRADE I DIASTOLIC DYSFUNCTION/  MILD LAE     reports that she has never smoked. She has never used smokeless tobacco. She reports that she does not drink alcohol or use drugs.  Allergies  Allergen Reactions  . Codeine Itching    Family History  Problem Relation Age of Onset  . Hypertension Mother   . Stroke Mother   . Coronary artery disease Other        Multiple first and second-degree relatives  . Aneurysm Other        Cerebral circulation  . Colon cancer Sister   . Stroke Sister   . Coronary artery disease Sister   . Clotting disorder Other        Children diagnosed with hypercoagulable state  . Coronary artery disease Brother     Prior to Admission medications   Medication Sig Start Date End Date Taking? Authorizing Provider  amLODipine (NORVASC) 5 MG tablet Take 5 mg by mouth daily. 07/03/17   [provider]  aspirin EC 81 MG tablet Take 1 tablet (81 mg total) by mouth daily. 11/03/18   Rhetta MuraSamtani, Jai-Gurmukh, MD  citalopram (CELEXA) 20 MG tablet Take 1 tablet (20 mg total) by mouth daily. 09/21/18   Oval Linseyondiego, Richard, MD  gabapentin (NEURONTIN) 300 MG capsule Take 300 mg by mouth 2 (two) times daily. 09/18/17   [provider]  insulin NPH-regular Human (NOVOLIN 70/30) (70-30) 100 UNIT/ML injection Inject 40 Units into the skin 2 (two) times daily.    [provider]  ipratropium-albuterol (DUONEB) 0.5-2.5 (3) MG/3ML SOLN Inhale 3 mLs into the lungs 4 (four) times daily as needed (for shortness of breath).  09/19/17   [provider]  levothyroxine (SYNTHROID, LEVOTHROID) 25 MCG tablet Take 25 mcg by mouth daily.     [provider]  LORazepam (ATIVAN) 1 MG tablet Take 1 tablet (1 mg total) by mouth at bedtime. 09/20/18   Oval Linseyondiego, Richard, MD  metFORMIN (GLUCOPHAGE) 500 MG tablet Take 500 mg by mouth 2 (two) times daily with a meal.      [provider]  nitroGLYCERIN (NITROSTAT) 0.4 MG SL tablet Place 0.4 mg under the tongue every 5 (five) minutes as needed for chest pain.  [provider]  oxyCODONE (OXY IR/ROXICODONE) 5 MG immediate release tablet Take 1 tablet (5 mg total) by mouth every 4 (four) hours as needed (Moderate pain). 06/12/18   Oval Linseyondiego, Richard, MD  predniSONE (DELTASONE) 20 MG tablet Take 20 mg by mouth 2 (two) times daily with a meal.  09/08/18   [provider]  sulfamethoxazole-trimethoprim (BACTRIM DS,SEPTRA DS) 800-160 MG tablet Take 1 tablet by mouth daily. 11/03/18   Rhetta MuraSamtani, Jai-Gurmukh, MD  triamcinolone cream (KENALOG) 0.1 % Apply 1 application topically 2 (two) times daily as needed (for flares).  05/04/18   [provider]    Physical Exam: Vitals:   12/13/18 0730 12/13/18 0745 12/13/18 0800 12/13/18 0836  BP: (!) 111/54  (!) 126/55 (!) 112/57  Pulse: 90 83 88 86  Resp: 20 20 (!) 21 20  Temp: 99.2 F (37.3 C)   99.1 F (37.3 C)  TempSrc: Oral   Oral  SpO2: (!) 89% 99% 97% 97%  Weight:    89.6 kg  Height:    5\' 4"  (1.626 m)    Constitutional: NAD, somnolent Vitals:   12/13/18 0730 12/13/18 0745 12/13/18 0800 12/13/18 0836  BP: (!) 111/54  (!) 126/55 (!) 112/57  Pulse: 90 83 88 86  Resp: 20 20 (!) 21 20  Temp: 99.2 F (37.3 C)   99.1 F (37.3 C)  TempSrc: Oral   Oral  SpO2: (!) 89% 99% 97% 97%  Weight:    89.6 kg  Height:    5\' 4"  (1.626 m)    Eyes: lids and conjunctivae normal ENMT: Mucous membranes are moist.  Neck: normal, supple Respiratory: clear to auscultation bilaterally. Normal respiratory effort. No accessory muscle use.  Currently on 2 L nasal cannula, ongoing wheezing noted Cardiovascular: Regular rate and rhythm, no murmurs. No extremity edema. Abdomen: no tenderness, no distention. Bowel sounds positive.  Musculoskeletal:  No joint deformity upper and lower extremities.   Skin: no rashes, lesions, ulcers.  Psychiatric: Cannot be assessed due to somnolence  Labs on Admission: I have personally reviewed following labs and imaging studies  CBC: Recent Labs  Lab 12/13/18 0419  WBC 12.5*  NEUTROABS 7.7  HGB 11.5*  HCT 39.8  MCV 84.5  PLT 306   Basic Metabolic Panel: Recent Labs  Lab 12/13/18 0419  NA 136  K 4.2  CL 105  CO2 24  GLUCOSE 201*  BUN 18  CREATININE 1.33*  CALCIUM 9.1   GFR: Estimated Creatinine Clearance: 36.6 mL/min (A) (by C-G formula based on SCr of 1.33 mg/dL (H)). Liver Function Tests: No results for input(s): AST, ALT, ALKPHOS, BILITOT, PROT, ALBUMIN in the last 168 hours. No results for input(s): LIPASE, AMYLASE in the last 168 hours. No results for input(s): AMMONIA in the last 168 hours. Coagulation Profile: No results for input(s): INR, PROTIME in the last 168 hours. Cardiac Enzymes: No results for input(s): CKTOTAL, CKMB, CKMBINDEX, TROPONINI in the last 168 hours. BNP (last 3 results) No results for input(s): PROBNP in the last 8760 hours. HbA1C: No results for input(s): HGBA1C in the last 72 hours. CBG: Recent Labs  Lab 12/13/18 0845  GLUCAP 235*   Lipid Profile: No results for input(s): CHOL, HDL, LDLCALC, TRIG, CHOLHDL, LDLDIRECT in the last 72 hours. Thyroid Function Tests: No results for input(s): TSH, T4TOTAL, FREET4, T3FREE, THYROIDAB in the last 72 hours. Anemia Panel: No results for input(s): VITAMINB12, FOLATE, FERRITIN, TIBC, IRON, RETICCTPCT in  the last 72 hours. Urine analysis:    Component  Value Date/Time   COLORURINE YELLOW 11/01/2018 1815   APPEARANCEUR CLOUDY (A) 11/01/2018 1815   LABSPEC 1.012 11/01/2018 1815   PHURINE 5.0 11/01/2018 1815   GLUCOSEU >=500 (A) 11/01/2018 1815   HGBUR NEGATIVE 11/01/2018 1815   BILIRUBINUR NEGATIVE 11/01/2018 1815   KETONESUR NEGATIVE 11/01/2018 1815   PROTEINUR NEGATIVE 11/01/2018 1815   UROBILINOGEN 0.2 08/29/2015 1842   NITRITE POSITIVE (A) 11/01/2018 1815   LEUKOCYTESUR LARGE (A) 11/01/2018 1815    Radiological Exams on Admission: Dg Chest Port 1 View  Result Date: 12/13/2018 CLINICAL DATA:  Cough, congestion and wheezing beginning yesterday. History of COPD. EXAM: PORTABLE CHEST 1 VIEW COMPARISON:  Chest radiographs September 18, 2018 FINDINGS: Cardiac silhouette is upper limits of normal size, mediastinal silhouette is not suspicious. Status post median sternotomy. Pulmonary vascular congestion without pleural effusion or focal consolidation. LEFT upper lobe scarring. No pneumothorax. RIGHT humeral head suture anchors and plate and screw fixation. Soft tissue planes are non suspicious. IMPRESSION: 1. Borderline cardiomegaly and pulmonary vascular congestion. Electronically Signed   By: Awilda Metro M.D.   On: 12/13/2018 05:00    EKG: Independently reviewed. 88bpm SR; LAFB.  Assessment/Plan Principal Problem:   COPD with acute exacerbation (HCC) Active Problems:   Hypothyroidism   DM type 2 causing complication (HCC)   Obesity   Essential hypertension   1. Acute hypoxemic respiratory failure likely secondary to COPD exacerbation.  Continue on IV steroids as well as DuoNebs every 4 hours.  Will transfer to stepdown unit with BiPAP due to respiratory acidosis.  She may also have a mild component of diastolic CHF exacerbation therefore will order a dose of Lasix to assess response. 2. Acute encephalopathy secondary to hypercapnia related to above.  Monitor closely in stepdown  unit with BiPAP and assess response. 3. AKI.  This may be related to some mild dehydration.  Will avoid nephrotoxic agents and try 1 dose of Lasix and follow BMP in a.m.  Monitor I's and O's as well as daily weights. 4. Type 2 diabetes.  Maintain on sliding scale insulin with resistant scale due to use of steroids.  Avoid oral agents currently. 5. Hypertension.  Continue on home blood pressure medications as her blood pressures are currently elevated. 6. Hypothyroidism.  Maintain on levothyroxine. 7. CAD.  Maintain on aspirin.   DVT prophylaxis: Heparin Code Status: Full Family Communication: None at bedside Disposition Plan: Treatment of AE COPD as well as some mild diuresis Consults called: None Admission status: Inpatient, stepdown unit   Daleyza Gadomski Hoover Brunette DO [facility] Pager [phone]  If 7PM-7AM, please contact night-coverage [email or web address] Password [email or web address]  12/13/2018, 10:20 AM

## 2018-12-13 NOTE — ED Notes (Signed)
Pt given diet sprite per request.  

## 2018-12-13 NOTE — ED Provider Notes (Signed)
[facility] EMERGENCY DEPARTMENT Provider Note   CSN: [id number] Arrival date & time: 12/13/18  4098     History   Chief Complaint Chief Complaint  Patient presents with  . Cough    HPI [name] [name] is a 82 y.o. female.  The history is provided by the patient.  Cough  This is a new problem. The current episode started 6 to 12 hours ago. The problem occurs constantly. The problem has been gradually worsening. The cough is non-productive. There has been no fever. Associated symptoms include shortness of breath and wheezing. Pertinent negatives include no chest pain. Treatments tried: Multiple nebulized therapies. The treatment provided no relief. Her past medical history is significant for COPD.  Patient with significant history of CAD, COPD, hypertension, previous stroke presents with cough and shortness of breath.  She reports over the past days been having increasing cough, shortness of breath, wheezing.  No fevers, no chest pain.  No hemoptysis reported. Reports left hip pain that is not new.  No new injuries.  She reports all the coughing has worsened her hip pain  Past Medical History:  Diagnosis Date  . Arthritis   . CAD (coronary artery disease)    Multivessel status post CABG 2001  . Chronic obstructive pulmonary disease (HCC)   . DDD (degenerative disc disease), lumbar   . Diastolic CHF (HCC)   . Essential hypertension   . Hematuria   . History of CVA (cerebrovascular accident)    2013 - lacunar infarct right thalamus - residual left side of  lip numb  and left eye vision worse (which corrented lens implant from cataract extraction)  . History of MI (myocardial infarction)    July 2001  . Hyperlipidemia   . Hypothyroidism   . Insulin dependent type 2 diabetes mellitus (HCC)   . Lesion of bladder   . Lower urinary tract symptoms (LUTS)   . OSA (obstructive sleep apnea)   . Peripheral neuropathy   . Psoriasis   . SUI (stress urinary incontinence, female)   .  Varicose veins     Patient Active Problem List   Diagnosis Date Noted  . CVA (cerebral vascular accident) (HCC) 11/01/2018  . Syncope   . Loss of consciousness (HCC) 06/10/2018  . Hyperglycemia 06/10/2018  . Bradycardia, sinus 06/10/2018  . Diastolic dysfunction 09/29/2017  . CAD (coronary artery disease) 09/28/2017  . Acute lower UTI 09/28/2017  . Ischemic stroke (HCC) 05/10/2017  . Acute ischemic stroke (HCC) 05/10/2017  . Stroke (HCC) 08/01/2016  . Dysarthria 03/18/2016  . Anemia of chronic disease 11/19/2015  . OSA (obstructive sleep apnea) 11/19/2015  . Left facial numbness 08/29/2015  . Gait instability 06/26/2015  . COPD exacerbation (HCC) 11/13/2014  . Weakness generalized 12/10/2013  . TIA (transient ischemic attack) 06/22/2012  . Left-sided headache 06/22/2012  . Noncompliance 06/22/2012  . Noncompliance with CPAP treatment 06/22/2012  . Psoriasis 01/07/2012  . Cerebrovascular disease   . Chronic back pain   . Chronic obstructive pulmonary disease (HCC)   . DM type 2 causing complication (HCC) 02/14/2010  . Obesity 02/14/2010  . Arteriosclerotic cardiovascular disease (ASCVD) 02/14/2010  . Hypothyroidism 02/06/2010  . Hyperlipidemia 02/06/2010  . Essential hypertension 02/06/2010  . Sleep apnea 02/06/2010    Past Surgical History:  Procedure Laterality Date  . APPENDECTOMY  age 58  . CARDIAC CATHETERIZATION  1991   No sig. cad  . CARDIAC CATHETERIZATION  07-01-2000   high grade in-stent restenosis  . CARDIAC CATHETERIZATION  04-30-2001   single native vessel cad/ graft patent  . CARDIAC CATHETERIZATION  02-13-2006  dr Jenness Cornerbrodie   2V cad with total occluded LAD  and  70% ostial of small ramus/  grafts patent/  normal ef  . CARDIAC CATHETERIZATION  01-20-2000  dr Clifton Jamesmcalhany   essentially unchanged;  severe single vessel cad with diffuse disease throughout CFX and RCA/ ef 66%;  chf with preserved LVSF  . CARDIOVASCULAR STRESS TEST  08-28-2011   DR ROTHBART    NEGATIVE NUCLEAR STUDY/  GLOBAL LVSF/  EF 65%  . CARPAL TUNNEL RELEASE Bilateral yrs ago  . CATARACT EXTRACTION W/ INTRAOCULAR LENS  IMPLANT, BILATERAL  2013  . CHOLECYSTECTOMY  1990  . COLONOSCOPY N/A 07/26/2015   Procedure: COLONOSCOPY;  Surgeon: Malissa HippoNajeeb U Rehman, MD;  [facility]: [facility];  Service: Endoscopy;  Laterality: N/A;  1200  . CORONARY ANGIOPLASTY  11-13-1999   balloon angioplasty to mLAD , d2 of LAD  . CORONARY ANGIOPLASTY WITH STENT PLACEMENT  01-17-2000   PCI stenting to mLAD & D2 of LAD  . CORONARY ARTERY BYPASS GRAFT  07-02-2000   DR Tressie StalkerLARENCE OWEN   SVG to diagonal, LIMA to LAD  . CYSTOSCOPY W/ RETROGRADES Bilateral 08/09/2014   Procedure: CYSTOSCOPY, BILATERAL RETROGRADE, HYDRODISTENSION, BLADDER BIOPSY WITH FULGERATION, INSTILL PYRIDIUM AND MARCAINE ;  Surgeon: Anner CreteJohn J Wrenn, MD;  [facility] [place];  Service: Urology;  Laterality: Bilateral;  . KNEE ARTHROSCOPY Left 10-18-2003   SYNOVECTOMY  . LUMBAR SPINE SURGERY  x2    yrs ago  . ORIF HUMERUS FRACTURE Right 04-18-2008  . REPAIR FLEXOR TENDON HAND Right 09-20-2008   CARPI RADIALIS TENDON TRANSFER TO EXTENSOR TO INDEX, MIDDLE, RING , LITTLE FINGERS AND DIGITI MININI  . ROTATOR CUFF REPAIR Right 1995  . TOTAL ABDOMINAL HYSTERECTOMY W/ BILATERAL SALPINGOOPHORECTOMY  age 82  . TRANSTHORACIC ECHOCARDIOGRAM  12-12-2013   MILD LVH/  EF 60-65%/  GRADE I DIASTOLIC DYSFUNCTION/  MILD LAE     OB History    Gravida  7   Para  7   Term  7   Preterm      AB      Living  4     SAB      TAB      Ectopic      Multiple      Live Births               Home Medications    Prior to Admission medications   Medication Sig Start Date End Date Taking? Authorizing Provider  amLODipine (NORVASC) 5 MG tablet Take 5 mg by mouth daily. 07/03/17   [provider]  aspirin EC 81 MG tablet Take 1 tablet (81 mg total) by mouth daily. 11/03/18   Rhetta MuraSamtani, Jai-Gurmukh, MD  citalopram (CELEXA) 20  MG tablet Take 1 tablet (20 mg total) by mouth daily. 09/21/18   Oval Linseyondiego, Richard, MD  gabapentin (NEURONTIN) 300 MG capsule Take 300 mg by mouth 2 (two) times daily. 09/18/17   [provider]  insulin NPH-regular Human (NOVOLIN 70/30) (70-30) 100 UNIT/ML injection Inject 40 Units into the skin 2 (two) times daily.    [provider]  ipratropium-albuterol (DUONEB) 0.5-2.5 (3) MG/3ML SOLN Inhale 3 mLs into the lungs 4 (four) times daily as needed (for shortness of breath).  09/19/17   [provider]  levothyroxine (SYNTHROID, LEVOTHROID) 25 MCG tablet Take 25 mcg by mouth daily.     [provider]  LORazepam (ATIVAN) 1 MG tablet Take 1 tablet (1 mg total) by mouth at bedtime. 09/20/18   Oval Linsey, MD  metFORMIN (GLUCOPHAGE) 500 MG tablet Take 500 mg by mouth 2 (two) times daily with a meal.      [provider]  nitroGLYCERIN (NITROSTAT) 0.4 MG SL tablet Place 0.4 mg under the tongue every 5 (five) minutes as needed for chest pain.     [provider]  oxyCODONE (OXY IR/ROXICODONE) 5 MG immediate release tablet Take 1 tablet (5 mg total) by mouth every 4 (four) hours as needed (Moderate pain). 06/12/18   Oval Linsey, MD  predniSONE (DELTASONE) 20 MG tablet Take 20 mg by mouth 2 (two) times daily with a meal.  09/08/18   [provider]  sulfamethoxazole-trimethoprim (BACTRIM DS,SEPTRA DS) 800-160 MG tablet Take 1 tablet by mouth daily. 11/03/18   Rhetta Mura, MD  triamcinolone cream (KENALOG) 0.1 % Apply 1 application topically 2 (two) times daily as needed (for flares).  05/04/18   [provider]    Family History Family History  Problem Relation Age of Onset  . Hypertension Mother   . Stroke Mother   . Coronary artery disease Other        Multiple first and second-degree relatives  . Aneurysm Other        Cerebral circulation  . Colon cancer Sister   . Stroke Sister   . Coronary artery disease  Sister   . Clotting disorder Other        Children diagnosed with hypercoagulable state  . Coronary artery disease Brother     Social History Social History   Tobacco Use  . Smoking status: Never Smoker  . Smokeless tobacco: Never Used  Substance Use Topics  . Alcohol use: No  . Drug use: No     Allergies   Codeine   Review of Systems Review of Systems  Constitutional: Negative for fever.  Respiratory: Positive for cough, shortness of breath and wheezing.   Cardiovascular: Negative for chest pain.  All other systems reviewed and are negative.    Physical Exam Updated Vital Signs BP (!) 143/65 (BP Location: Right Arm)   Pulse 93   Temp 99.9 F (37.7 C) (Oral)   Resp (!) 24   Ht 1.626 m (5\' 4" )   Wt 86.2 kg   SpO2 95%   BMI 32.61 kg/m   Physical Exam CONSTITUTIONAL: Elderly, chronically ill-appearing, audible wheezing noted when I walk in the room HEAD: Normocephalic/atraumatic EYES: EOMI/PERRL ENMT: Mucous membranes moist NECK: supple no meningeal signs SPINE/BACK:entire spine nontender CV: S1/S2 noted, no murmurs/rubs/gallops noted LUNGS: Wheezing bilaterally ABDOMEN: soft, nontender NEURO: Pt is awake/alert/appropriate, moves all extremitiesx4.  No facial droop.   EXTREMITIES: pulses normal/equal, full ROM, full range of motion of both lower extremities, no deformities, tenderness elicited with range of motion of the left hip SKIN: warm, color normal PSYCH: no abnormalities of mood noted, alert and oriented to situation   ED Treatments / Results  Labs (all labs ordered are listed, but only abnormal results are displayed) Labs Reviewed  BASIC METABOLIC PANEL - Abnormal; Notable for the following components:      Result Value   Glucose, Bld 201 (*)    Creatinine, Ser 1.33 (*)    GFR calc non Af Amer 38 (*)    GFR calc Af Amer 44 (*)    All other components within normal limits  CBC WITH DIFFERENTIAL/PLATELET - Abnormal; Notable for the following  components:  WBC 12.5 (*)    Hemoglobin 11.5 (*)    MCH 24.4 (*)    MCHC 28.9 (*)    RDW 16.4 (*)    Monocytes Absolute 1.2 (*)    Eosinophils Absolute 0.8 (*)    All other components within normal limits  INFLUENZA PANEL BY PCR (TYPE A & B)  BRAIN NATRIURETIC PEPTIDE    EKG EKG Interpretation  Date/Time:  Sunday December 13 2018 04:12:42 EST Ventricular Rate:  88 PR Interval:    QRS Duration: 102 QT Interval:  314 QTC Calculation: 380 R Axis:   -55 Text Interpretation:  Sinus rhythm Left anterior fascicular block Low voltage, precordial leads Baseline wander in lead(s) V3 V6 Interpretation limited secondary to artifact Otherwise no significant change Confirmed by [name] ([id number]) on 12/13/2018 4:21:15 AM   Radiology Dg Chest Port 1 View  Result Date: 12/13/2018 CLINICAL DATA:  Cough, congestion and wheezing beginning yesterday. History of COPD. EXAM: PORTABLE CHEST 1 VIEW COMPARISON:  Chest radiographs September 18, 2018 FINDINGS: Cardiac silhouette is upper limits of normal size, mediastinal silhouette is not suspicious. Status post median sternotomy. Pulmonary vascular congestion without pleural effusion or focal consolidation. LEFT upper lobe scarring. No pneumothorax. RIGHT humeral head suture anchors and plate and screw fixation. Soft tissue planes are non suspicious. IMPRESSION: 1. Borderline cardiomegaly and pulmonary vascular congestion. Electronically Signed   By: Awilda Metro M.D.   On: 12/13/2018 05:00    Procedures Procedures CRITICAL CARE Performed by: Joya Gaskins Total critical care time: 33 minutes Critical care time was exclusive of separately billable procedures and treating other patients. Critical care was necessary to treat or prevent imminent or life-threatening deterioration. Critical care was time spent personally by me on the following activities: development of treatment plan with patient and/or surrogate as well as nursing,  discussions with consultants, evaluation of patient's response to treatment, examination of patient, obtaining history from patient or surrogate, ordering and performing treatments and interventions, ordering and review of laboratory studies, ordering and review of radiographic studies, pulse oximetry and re-evaluation of patient's condition.  Medications Ordered in ED Medications  ipratropium-albuterol (DUONEB) 0.5-2.5 (3) MG/3ML nebulizer solution 3 mL (3 mLs Nebulization Given 12/13/18 0408)  predniSONE (DELTASONE) tablet 60 mg (60 mg Oral Given by Other 12/13/18 0444)  HYDROcodone-acetaminophen (NORCO/VICODIN) 5-325 MG per tablet 1 tablet (1 tablet Oral Given by Other 12/13/18 0443)  albuterol (PROVENTIL,VENTOLIN) solution continuous neb (10 mg/hr Nebulization Given 12/13/18 0531)  HYDROcodone-acetaminophen (NORCO/VICODIN) 5-325 MG per tablet 1 tablet (1 tablet Oral Given 12/13/18 1324)     Initial Impression / Assessment and Plan / ED Course  I have reviewed the triage vital signs and the nursing notes.  Pertinent labs & imaging results that were available during my care of the patient were reviewed by me and considered in my medical decision making (see chart for details).     5:35 AM Patient with multiple medical conditions including COPD presents with cough and wheezing.  After 1 treatment, she still has residual wheeze.  Will give hour-long nebulized therapy.  She also reports left hip pain is chronic and worsening with her coughing.  No signs of any acute traumatic injury. 7:13 AM Patient with continued cough and wheezing despite therapies in the ED.  She is also having bouts of hypoxia.  Legrand Rams this is COPD rather than acute CHF.  She will need to be admitted as she reports that she is unable to do her daily activities as she is usually  active at home.  She denies use of oxygen. Discussed with Dr. Sherryll BurgerShah for admission. Final Clinical Impressions(s) / ED Diagnoses   Final diagnoses:    COPD exacerbation [facility](HCC)    ED Discharge Orders    None       Zadie RhineWickline, Bena Kobel, MD 12/13/18 [phone]

## 2018-12-14 LAB — URINALYSIS, ROUTINE W REFLEX MICROSCOPIC
Bilirubin Urine: NEGATIVE
Glucose, UA: >=500 mg/dL — AB
Hgb urine dipstick: NEGATIVE
Ketones, ur: NEGATIVE mg/dL
Nitrite: NEGATIVE
PROTEIN: NEGATIVE mg/dL
Specific Gravity, Urine: 1.013 (ref 1.005–1.030)
pH: 5 (ref 5.0–8.0)

## 2018-12-14 LAB — CBC
HCT: 39.2 % (ref 36.0–46.0)
HEMOGLOBIN: 11.4 g/dL — AB (ref 12.0–15.0)
MCH: 24.5 pg — ABNORMAL LOW (ref 26.0–34.0)
MCHC: 29.1 g/dL — ABNORMAL LOW (ref 30.0–36.0)
MCV: 84.3 fL (ref 80.0–100.0)
Platelets: 305 10*3/uL (ref 150–400)
RBC: 4.65 MIL/uL (ref 3.87–5.11)
RDW: 16.2 % — ABNORMAL HIGH (ref 11.5–15.5)
WBC: 15.5 10*3/uL — ABNORMAL HIGH (ref 4.0–10.5)
nRBC: 0 % (ref 0.0–0.2)

## 2018-12-14 LAB — BASIC METABOLIC PANEL
Anion gap: 9 (ref 5–15)
BUN: 34 mg/dL — AB (ref 8–23)
CO2: 23 mmol/L (ref 22–32)
Calcium: 9.5 mg/dL (ref 8.9–10.3)
Chloride: 100 mmol/L (ref 98–111)
Creatinine, Ser: 1.79 mg/dL — ABNORMAL HIGH (ref 0.44–1.00)
GFR calc Af Amer: 30 mL/min — ABNORMAL LOW (ref >60)
GFR calc non Af Amer: 26 mL/min — ABNORMAL LOW (ref >60)
Glucose, Bld: 354 mg/dL — ABNORMAL HIGH (ref 70–99)
POTASSIUM: 5.5 mmol/L — AB (ref 3.5–5.1)
Sodium: 132 mmol/L — ABNORMAL LOW (ref 135–145)

## 2018-12-14 LAB — GLUCOSE, CAPILLARY
Glucose-Capillary: 326 mg/dL — ABNORMAL HIGH (ref 70–99)
Glucose-Capillary: 230 mg/dL — ABNORMAL HIGH (ref 70–99)
Glucose-Capillary: 480 mg/dL — ABNORMAL HIGH (ref 70–99)
Glucose-Capillary: 423 mg/dL — ABNORMAL HIGH (ref 70–99)
Glucose-Capillary: 248 mg/dL — ABNORMAL HIGH (ref 70–99)
Glucose-Capillary: 190 mg/dL — ABNORMAL HIGH (ref 70–99)

## 2018-12-14 LAB — BLOOD GAS, ARTERIAL
ACID-BASE DEFICIT: 1.3 mmol/L (ref 0.0–2.0)
Bicarbonate: 23.1 mmol/L (ref 20.0–28.0)
Drawn by: 28459
O2 Content: 3 L/min
O2 Saturation: 96.3 %
Patient temperature: 37
pCO2 arterial: 43.2 mmHg (ref 32.0–48.0)
pH, Arterial: 7.355 (ref 7.350–7.450)
pO2, Arterial: 86.5 mmHg (ref 83.0–108.0)

## 2018-12-14 MED ORDER — OXYCODONE HCL 5 MG PO TABS: 5.0000 mg | ORAL_TABLET | ORAL | Status: DC | PRN

## 2018-12-14 MED ORDER — SODIUM ZIRCONIUM CYCLOSILICATE 10 G PO PACK
10.0000 g | PACK | Freq: Once | ORAL | Status: AC
  Administered 2018-12-14: 10 g via ORAL
  Filled 2018-12-14: qty 1

## 2018-12-14 MED ORDER — SALINE SPRAY 0.65 % NA SOLN
1.0000 | NASAL | Status: DC | PRN
  Administered 2018-12-14: 1 via NASAL
  Filled 2018-12-14: qty 44

## 2018-12-14 MED ORDER — SODIUM CHLORIDE 0.9 % IV SOLN
INTRAVENOUS | Status: DC
  Administered 2018-12-14 (×2): via INTRAVENOUS

## 2018-12-14 MED ORDER — INSULIN ASPART 100 UNIT/ML ~~LOC~~ SOLN: 0.0000 [IU] | Freq: Every day | SUBCUTANEOUS | Status: DC

## 2018-12-14 MED ORDER — INSULIN ASPART 100 UNIT/ML ~~LOC~~ SOLN
15.0000 [IU] | Freq: Once | SUBCUTANEOUS | Status: AC
  Administered 2018-12-14: 15 [IU] via SUBCUTANEOUS

## 2018-12-14 MED ORDER — GUAIFENESIN-DM 100-10 MG/5ML PO SYRP
10.0000 mL | ORAL_SOLUTION | ORAL | Status: DC | PRN
  Administered 2018-12-14 – 2018-12-17 (×7): 10 mL via ORAL
  Filled 2018-12-14 (×7): qty 10

## 2018-12-14 MED ORDER — INSULIN ASPART 100 UNIT/ML IV SOLN
10.0000 [IU] | Freq: Once | INTRAVENOUS | Status: AC
  Administered 2018-12-14: 10 [IU] via INTRAVENOUS

## 2018-12-14 MED ORDER — OXYCODONE HCL 5 MG PO TABS
5.0000 mg | ORAL_TABLET | ORAL | Status: DC | PRN
  Administered 2018-12-14 – 2018-12-17 (×3): 5 mg via ORAL
  Filled 2018-12-14 (×3): qty 1

## 2018-12-14 MED ORDER — SODIUM CHLORIDE 0.9 % IV SOLN: INTRAVENOUS | Status: DC

## 2018-12-14 MED ORDER — ZOLPIDEM TARTRATE 5 MG PO TABS
5.0000 mg | ORAL_TABLET | Freq: Every evening | ORAL | Status: DC | PRN
  Administered 2018-12-14 – 2018-12-16 (×3): 5 mg via ORAL
  Filled 2018-12-14 (×3): qty 1

## 2018-12-14 MED ORDER — INSULIN ASPART PROT & ASPART (70-30 MIX) 100 UNIT/ML ~~LOC~~ SUSP
40.0000 [IU] | Freq: Two times a day (BID) | SUBCUTANEOUS | Status: DC
  Administered 2018-12-14 – 2018-12-17 (×7): 40 [IU] via SUBCUTANEOUS
  Filled 2018-12-14: qty 10

## 2018-12-14 MED ORDER — INSULIN ASPART 100 UNIT/ML ~~LOC~~ SOLN: 3.0000 [IU] | Freq: Three times a day (TID) | SUBCUTANEOUS | Status: DC

## 2018-12-14 MED ORDER — IPRATROPIUM-ALBUTEROL 0.5-2.5 (3) MG/3ML IN SOLN
3.0000 mL | Freq: Three times a day (TID) | RESPIRATORY_TRACT | Status: DC
  Administered 2018-12-14 – 2018-12-17 (×8): 3 mL via RESPIRATORY_TRACT
  Filled 2018-12-14 (×9): qty 3

## 2018-12-14 MED ORDER — METHYLPREDNISOLONE SODIUM SUCC 40 MG IJ SOLR
40.0000 mg | Freq: Two times a day (BID) | INTRAMUSCULAR | Status: DC
  Administered 2018-12-14: 40 mg via INTRAVENOUS
  Filled 2018-12-14: qty 1

## 2018-12-14 MED ORDER — INSULIN GLARGINE 100 UNIT/ML ~~LOC~~ SOLN
5.0000 [IU] | Freq: Two times a day (BID) | SUBCUTANEOUS | Status: DC
  Filled 2018-12-14: qty 0.05

## 2018-12-14 MED ORDER — GABAPENTIN 300 MG PO CAPS
300.0000 mg | ORAL_CAPSULE | Freq: Two times a day (BID) | ORAL | Status: DC
  Administered 2018-12-14 – 2018-12-17 (×7): 300 mg via ORAL
  Filled 2018-12-14 (×7): qty 1

## 2018-12-14 MED ORDER — BUDESONIDE 0.25 MG/2ML IN SUSP
0.2500 mg | Freq: Two times a day (BID) | RESPIRATORY_TRACT | Status: DC
  Administered 2018-12-14 – 2018-12-17 (×7): 0.25 mg via RESPIRATORY_TRACT
  Filled 2018-12-14 (×7): qty 2

## 2018-12-14 MED ORDER — IPRATROPIUM-ALBUTEROL 0.5-2.5 (3) MG/3ML IN SOLN
3.0000 mL | RESPIRATORY_TRACT | Status: DC
  Administered 2018-12-14 (×3): 3 mL via RESPIRATORY_TRACT
  Filled 2018-12-14 (×3): qty 3

## 2018-12-14 NOTE — Progress Notes (Signed)
Patient is still wheezing, she has been awake most of the night. CPAP has not been used.

## 2018-12-14 NOTE — Progress Notes (Signed)
PROGRESS NOTE    [name] [name]  WUJ:[id number] DOB: 03-06-1936 DOA: 12/13/2018 PCP: Oval Linsey, MD   Brief Narrative:  Per HPI: [name] [name] is a 82 y.o. female with medical history significant for COPD without home oxygen, but with noted chronic steroid use, CAD, OSA with noncompliance on home CPAP machine, grade 1 diastolic dysfunction with LVEF 60 to 65%, dyslipidemia, hypothyroidism, insulin-dependent type 2 diabetes, and obesity who presented to the emergency department with a nonproductive cough as well as shortness of breath that has been worsening over the last 1 to 2 days.  She has tried breathing treatments at home with minimal relief of her symptoms and denies any lower extremity edema or weight gain.  She denies any fevers or chills at home.  No chest pain noted.  Patient has been admitted with acute hypoxemic respiratory failure secondary to COPD exacerbation.  She was noted to have some hypercapnic encephalopathy initially which has resolved overnight after close monitoring.  She is also noted to have AKI.  Assessment & Plan:   Principal Problem:   COPD with acute exacerbation (HCC) Active Problems:   Hypothyroidism   DM type 2 causing complication (HCC)   Obesity   Essential hypertension  1. Acute hypoxemic respiratory failure likely secondary to COPD exacerbation-persistent.  Continue on IV steroids as well as DuoNebs every 4 hours.  Will transfer to MedSurg floor today she has no further respiratory distress or lethargy. 2. Acute encephalopathy secondary to hypercapnia related to above-resolved.   3. AKI-worsening.    Trial of IV fluids to start today no further Lasix.  Continue to monitor I's and O's as well as daily weights.  Avoid nephrotoxic agents. 4. Type 2 diabetes-worsening hyperglycemia.  Maintain on sliding scale insulin with resistant scale due to use of steroids.    Restart home 70/30 NovoLog as she is tolerating diet now.  Continue to monitor  closely. 5. Dysuria.  Will check urine analysis today and monitor. 6. Mild hyperkalemia.  No changes noted on telemetry.  Will administer 1 dose of Lokelma. 7. Hypertension.  Continue on home blood pressure medications as her blood pressures are now better controlled. 8. Hypothyroidism.  Maintain on levothyroxine. 9. CAD.  Maintain on aspirin.   DVT prophylaxis: Heparin Code Status: Full Family Communication: None at bedside Disposition Plan: Continue COPD treatment and wean oxygen as tolerated.  Start IV normal saline for mild hyponatremia as well as AKI.   Consultants:   None  Procedures:   None  Antimicrobials:   Doxycycline 12/22->   Subjective: Patient seen and evaluated today with ongoing cough and congestion as well as some dysuria that is bothering her.  She is not able to sleep very well at night.  She has not required any BiPAP or CPAP overnight.  Objective: Vitals:   12/14/18 0405 12/14/18 0421 12/14/18 0730 12/14/18 0759  BP:      Pulse:      Resp:      Temp: 98 F (36.7 C)  98.4 F (36.9 C)   TempSrc: Oral  Oral   SpO2:  99%  97%  Weight: 91.5 kg     Height:        Intake/Output Summary (Last 24 hours) at 12/14/2018 0859 Last data filed at 12/14/2018 0430 Gross per 24 hour  Intake 220 ml  Output 650 ml  Net -430 ml   Filed Weights   12/13/18 0836 12/13/18 1636 12/14/18 0405  Weight: 89.6 kg 90.8 kg 91.5 kg  Examination:  General exam: Appears calm and comfortable  Respiratory system: Clear to auscultation. Respiratory effort normal.  Currently on 3 L nasal cannula. Cardiovascular system: S1 & S2 heard, RRR. No JVD, murmurs, rubs, gallops or clicks. No pedal edema. Gastrointestinal system: Abdomen is nondistended, soft and nontender. No organomegaly or masses felt. Normal bowel sounds heard. Central nervous system: Alert and oriented. No focal neurological deficits. Extremities: Symmetric 5 x 5 power. Skin: No rashes, lesions or  ulcers Psychiatry: Judgement and insight appear normal. Mood & affect appropriate.     Data Reviewed: I have personally reviewed following labs and imaging studies  CBC: Recent Labs  Lab 12/13/18 0419 12/14/18 0356  WBC 12.5* 15.5*  NEUTROABS 7.7  --   HGB 11.5* 11.4*  HCT 39.8 39.2  MCV 84.5 84.3  PLT 306 305   Basic Metabolic Panel: Recent Labs  Lab 12/13/18 0419 12/14/18 0356  NA 136 132*  K 4.2 5.5*  CL 105 100  CO2 24 23  GLUCOSE 201* 354*  BUN 18 34*  CREATININE 1.33* 1.79*  CALCIUM 9.1 9.5   GFR: Estimated Creatinine Clearance: 27.5 mL/min (A) (by C-G formula based on SCr of 1.79 mg/dL (H)). Liver Function Tests: No results for input(s): AST, ALT, ALKPHOS, BILITOT, PROT, ALBUMIN in the last 168 hours. No results for input(s): LIPASE, AMYLASE in the last 168 hours. No results for input(s): AMMONIA in the last 168 hours. Coagulation Profile: No results for input(s): INR, PROTIME in the last 168 hours. Cardiac Enzymes: No results for input(s): CKTOTAL, CKMB, CKMBINDEX, TROPONINI in the last 168 hours. BNP (last 3 results) No results for input(s): PROBNP in the last 8760 hours. HbA1C: No results for input(s): HGBA1C in the last 72 hours. CBG: Recent Labs  Lab 12/13/18 1600 12/13/18 2112 12/13/18 2343 12/14/18 0404 12/14/18 0734  GLUCAP 296* 452* 429* 326* 230*   Lipid Profile: No results for input(s): CHOL, HDL, LDLCALC, TRIG, CHOLHDL, LDLDIRECT in the last 72 hours. Thyroid Function Tests: Recent Labs    12/13/18 0419  TSH 2.306   Anemia Panel: No results for input(s): VITAMINB12, FOLATE, FERRITIN, TIBC, IRON, RETICCTPCT in the last 72 hours. Sepsis Labs: No results for input(s): PROCALCITON, LATICACIDVEN in the last 168 hours.  Recent Results (from the past 240 hour(s))  MRSA PCR Screening     Status: None   Collection Time: 12/13/18  4:47 PM  Result Value Ref Range Status   MRSA by PCR NEGATIVE NEGATIVE Final    Comment:        The  GeneXpert MRSA Assay (FDA approved for NASAL specimens only), is one component of a comprehensive MRSA colonization surveillance program. It is not intended to diagnose MRSA infection nor to guide or monitor treatment for MRSA infections. Performed at [facility], [address] [place], [place] [address]          Radiology Studies: [facility]  Result Date: 12/13/2018 CLINICAL DATA:  Cough, congestion and wheezing beginning yesterday. History of COPD. EXAM: PORTABLE CHEST 1 VIEW COMPARISON:  Chest radiographs September 18, 2018 FINDINGS: Cardiac silhouette is upper limits of normal size, mediastinal silhouette is not suspicious. Status post median sternotomy. Pulmonary vascular congestion without pleural effusion or focal consolidation. LEFT upper lobe scarring. No pneumothorax. RIGHT humeral head suture anchors and plate and screw fixation. Soft tissue planes are non suspicious. IMPRESSION: 1. Borderline cardiomegaly and pulmonary vascular congestion. Electronically Signed   By: Awilda Metroourtnay  Bloomer M.D.   On: 12/13/2018 05:00  Scheduled Meds: . amLODipine  5 mg Oral Daily  . aspirin EC  81 mg Oral Daily  . budesonide (PULMICORT) nebulizer solution  0.25 mg Nebulization BID  . chlorhexidine  15 mL Mouth Rinse BID  . citalopram  20 mg Oral Daily  . doxycycline  100 mg Oral Q12H  . gabapentin  300 mg Oral BID  . heparin  5,000 Units Subcutaneous Q8H  . insulin aspart  0-20 Units Subcutaneous Q4H  . insulin aspart  0-5 Units Subcutaneous QHS  . insulin aspart protamine- aspart  40 Units Subcutaneous BID WC  . ipratropium-albuterol  3 mL Nebulization Q4H WA  . levothyroxine  25 mcg Oral QAC breakfast  . mouth rinse  15 mL Mouth Rinse q12n4p  . methylPREDNISolone (SOLU-MEDROL) injection  60 mg Intravenous Q12H  . sodium chloride flush  3 mL Intravenous Q12H  . sodium zirconium cyclosilicate  10 g Oral Once   Continuous Infusions: . sodium chloride    .  sodium chloride 75 mL/hr at 12/14/18 0811     LOS: 1 day    Time spent: 30 minutes    Cylis Ayars Hoover BrunetteD Hollan Philipp, DO [facility] Pager [phone]  If 7PM-7AM, please contact night-coverage [email or web address] Password [email or web address] 12/14/2018, 8:59 AM

## 2018-12-15 LAB — BASIC METABOLIC PANEL
Anion gap: 5 (ref 5–15)
BUN: 38 mg/dL — ABNORMAL HIGH (ref 8–23)
CO2: 24 mmol/L (ref 22–32)
Calcium: 8.8 mg/dL — ABNORMAL LOW (ref 8.9–10.3)
Chloride: 107 mmol/L (ref 98–111)
Creatinine, Ser: 0.99 mg/dL (ref 0.44–1.00)
GFR calc Af Amer: >60 mL/min (ref >60)
GFR calc non Af Amer: 54 mL/min — ABNORMAL LOW (ref >60)
Glucose, Bld: 154 mg/dL — ABNORMAL HIGH (ref 70–99)
Potassium: 5.1 mmol/L (ref 3.5–5.1)
Sodium: 136 mmol/L (ref 135–145)

## 2018-12-15 LAB — GLUCOSE, CAPILLARY
Glucose-Capillary: 107 mg/dL — ABNORMAL HIGH (ref 70–99)
Glucose-Capillary: 128 mg/dL — ABNORMAL HIGH (ref 70–99)
Glucose-Capillary: 138 mg/dL — ABNORMAL HIGH (ref 70–99)
Glucose-Capillary: 152 mg/dL — ABNORMAL HIGH (ref 70–99)
Glucose-Capillary: 183 mg/dL — ABNORMAL HIGH (ref 70–99)
Glucose-Capillary: 271 mg/dL — ABNORMAL HIGH (ref 70–99)
Glucose-Capillary: 319 mg/dL — ABNORMAL HIGH (ref 70–99)

## 2018-12-15 LAB — CBC
HCT: 36.4 % (ref 36.0–46.0)
Hemoglobin: 10.6 g/dL — ABNORMAL LOW (ref 12.0–15.0)
MCH: 23.9 pg — AB (ref 26.0–34.0)
MCHC: 29.1 g/dL — ABNORMAL LOW (ref 30.0–36.0)
MCV: 82 fL (ref 80.0–100.0)
Platelets: 296 10*3/uL (ref 150–400)
RBC: 4.44 MIL/uL (ref 3.87–5.11)
RDW: 16.5 % — ABNORMAL HIGH (ref 11.5–15.5)
WBC: 16.2 10*3/uL — ABNORMAL HIGH (ref 4.0–10.5)
nRBC: 0 % (ref 0.0–0.2)

## 2018-12-15 MED ORDER — METHYLPREDNISOLONE SODIUM SUCC 125 MG IJ SOLR
80.0000 mg | Freq: Two times a day (BID) | INTRAMUSCULAR | Status: DC
  Administered 2018-12-15 – 2018-12-16 (×4): 80 mg via INTRAVENOUS
  Filled 2018-12-15 (×4): qty 2

## 2018-12-15 MED ORDER — SODIUM CHLORIDE 0.9 % IV SOLN
1.0000 g | INTRAVENOUS | Status: DC
  Administered 2018-12-15 – 2018-12-17 (×3): 1 g via INTRAVENOUS
  Filled 2018-12-15: qty 1
  Filled 2018-12-15: qty 10
  Filled 2018-12-15 (×2): qty 1

## 2018-12-15 MED ORDER — LORAZEPAM 2 MG/ML IJ SOLN
0.5000 mg | INTRAMUSCULAR | Status: DC
  Administered 2018-12-15 – 2018-12-17 (×13): 0.5 mg via INTRAVENOUS
  Filled 2018-12-15 (×14): qty 1

## 2018-12-15 MED ORDER — LORAZEPAM 2 MG/ML IJ SOLN
2.0000 mg | Freq: Once | INTRAMUSCULAR | Status: AC
  Administered 2018-12-15: 2 mg via INTRAVENOUS
  Filled 2018-12-15: qty 1

## 2018-12-15 NOTE — Progress Notes (Signed)
Heard patient calling for help. Went to room and patient found in the floor next to the bed. No injuries noted.  Bed alarm had been placed on when RN and NT left the room. Instructed patient not to get up without assistance. Bed alarm placed on. Will continue to monitor patient.

## 2018-12-15 NOTE — Progress Notes (Signed)
Patient became confused and agitated, pulling at IV tubing and attempting to get out of bed. Attempted to reorient patient without success. Patient kicking and hitting at staff members. MD paged and orders given for Ativan.

## 2018-12-15 NOTE — Progress Notes (Signed)
PROGRESS NOTE    [name] [name]  [id number] DOB: 05/18/1936 DOA: 12/13/2018 PCP: Oval Linseyondiego, Richard, MD   Brief Narrative:   Per HPI: [name] [name] a 82 y.o.femalewith medical history significant forCOPD without home oxygen, but with noted chronic steroid use, CAD, OSA with noncompliance on home CPAP machine, grade 1 diastolic dysfunction with LVEF 60 to 65%, dyslipidemia, hypothyroidism, insulin-dependent type 2 diabetes, and obesity who presented to the emergency department with a nonproductive cough as well as shortness of breath that has been worsening over the last 1 to 2 days. She has tried breathing treatments at home with minimal relief of her symptoms and denies any lower extremity edema or weight gain. She denies any fevers or chills at home. No chest pain noted.  Patient has been admitted with acute hypoxemic respiratory failure secondary to COPD exacerbation.  She was noted to have some hypercapnic encephalopathy initially which has resolved overnight after close monitoring.  She is also noted to have AKI and UTI with agitation noted overnight.  Assessment & Plan:   Principal Problem:   COPD with acute exacerbation (HCC) Active Problems:   Hypothyroidism   DM type 2 causing complication (HCC)   Obesity   Essential hypertension   1. Acute hypoxemic respiratory failure likely secondary to COPD exacerbation-persistent. Continue on IV steroids with increased dose as well as DuoNebs every 4 hours.  Continue on doxycycline. 2. Acute encephalopathy likely metabolic secondary to UTI.  Will start on Rocephin and monitor closely.  Urine cultures pending. 3. AKI- improved.    Hold further IV fluids due to hypoxemia. 4. Type 2 diabetes- improving hyperglycemia. Maintain on sliding scale insulin with resistant scale due to use of steroids.   Restart home 70/30 NovoLog as she is tolerating diet now.  Continue to monitor closely. 5. Dysuria with suspicion of UTI.     Started on Rocephin as noted above and monitor urine culture. 6. Mild hyperkalemia-improving.  Continue to monitor with repeat BMP in a.m. 7. Hypertension. Continue on home blood pressure medications as her blood pressures are now better controlled. 8. Hypothyroidism. Maintain on levothyroxine. 9. CAD. Maintain on aspirin.   DVT prophylaxis: Heparin Code Status: Full Family Communication: None at bedside Disposition Plan: Continue COPD treatment and wean oxygen as tolerated.    Continue gentle IV normal saline with addition of Rocephin for UTI with cultures pending.   Consultants:   None  Procedures:   None  Antimicrobials:   Doxycycline 12/22->  Rocephin 12/24->  Subjective: Patient seen and evaluated today with some ongoing agitation noted overnight for which she was given some Ativan.  She is noted to have a UTI with cultures that have been ordered this morning.  Objective: Vitals:   12/14/18 2358 12/15/18 0608 12/15/18 0726 12/15/18 0731  BP: (!) 161/85 137/67    Pulse: 91 71    Resp: 18 18    Temp: 98.3 F (36.8 C) 98.3 F (36.8 C)    TempSrc: Oral Oral    SpO2: 97% 95% 97% 97%  Weight:  92.5 kg    Height:        Intake/Output Summary (Last 24 hours) at 12/15/2018 0854 Last data filed at 12/15/2018 [id number] Gross per 24 hour  Intake 1142.69 ml  Output 650 ml  Net 492.69 ml   Filed Weights   12/13/18 1636 12/14/18 0405 12/15/18 0608  Weight: 90.8 kg 91.5 kg 92.5 kg    Examination:  General exam: Appears calm and comfortable  Respiratory system:  Clear to auscultation. Respiratory effort normal.  Ongoing wheezing noted; receiving breathing treatment. Cardiovascular system: S1 & S2 heard, RRR. No JVD, murmurs, rubs, gallops or clicks. No pedal edema. Gastrointestinal system: Abdomen is nondistended, soft and nontender. No organomegaly or masses felt. Normal bowel sounds heard. Central nervous system: Alert and awake, but mildly  agitated. Extremities: Symmetric 5 x 5 power. Skin: No rashes, lesions or ulcers Psychiatry: Appears to be somewhat anxious.    Data Reviewed: I have personally reviewed following labs and imaging studies  CBC: Recent Labs  Lab 12/13/18 0419 12/14/18 0356 12/15/18 0523  WBC 12.5* 15.5* 16.2*  NEUTROABS 7.7  --   --   HGB 11.5* 11.4* 10.6*  HCT 39.8 39.2 36.4  MCV 84.5 84.3 82.0  PLT 306 305 296   Basic Metabolic Panel: Recent Labs  Lab 12/13/18 0419 12/14/18 0356 12/15/18 0523  NA 136 132* 136  K 4.2 5.5* 5.1  CL 105 100 107  CO2 24 23 24   GLUCOSE 201* 354* 154*  BUN 18 34* 38*  CREATININE 1.33* 1.79* 0.99  CALCIUM 9.1 9.5 8.8*   GFR: Estimated Creatinine Clearance: 49.9 mL/min (by C-G formula based on SCr of 0.99 mg/dL). Liver Function Tests: No results for input(s): AST, ALT, ALKPHOS, BILITOT, PROT, ALBUMIN in the last 168 hours. No results for input(s): LIPASE, AMYLASE in the last 168 hours. No results for input(s): AMMONIA in the last 168 hours. Coagulation Profile: No results for input(s): INR, PROTIME in the last 168 hours. Cardiac Enzymes: No results for input(s): CKTOTAL, CKMB, CKMBINDEX, TROPONINI in the last 168 hours. BNP (last 3 results) No results for input(s): PROBNP in the last 8760 hours. HbA1C: No results for input(s): HGBA1C in the last 72 hours. CBG: Recent Labs  Lab 12/14/18 1620 12/14/18 2021 12/15/18 0006 12/15/18 0352 12/15/18 0737  GLUCAP 248* 190* 152* 128* 183*   Lipid Profile: No results for input(s): CHOL, HDL, LDLCALC, TRIG, CHOLHDL, LDLDIRECT in the last 72 hours. Thyroid Function Tests: Recent Labs    12/13/18 0419  TSH 2.306   Anemia Panel: No results for input(s): VITAMINB12, FOLATE, FERRITIN, TIBC, IRON, RETICCTPCT in the last 72 hours. Sepsis Labs: No results for input(s): PROCALCITON, LATICACIDVEN in the last 168 hours.  Recent Results (from the past 240 hour(s))  MRSA PCR Screening     Status: None    Collection Time: 12/13/18  4:47 PM  Result Value Ref Range Status   MRSA by PCR NEGATIVE NEGATIVE Final    Comment:        The GeneXpert MRSA Assay (FDA approved for NASAL specimens only), is one component of a comprehensive MRSA colonization surveillance program. It is not intended to diagnose MRSA infection nor to guide or monitor treatment for MRSA infections. Performed at [facility], [address], [place], [place] [address]          Radiology Studies: No results found.      Scheduled Meds: . amLODipine  5 mg Oral Daily  . aspirin EC  81 mg Oral Daily  . budesonide (PULMICORT) nebulizer solution  0.25 mg Nebulization BID  . chlorhexidine  15 mL Mouth Rinse BID  . citalopram  20 mg Oral Daily  . doxycycline  100 mg Oral Q12H  . gabapentin  300 mg Oral BID  . heparin  5,000 Units Subcutaneous Q8H  . insulin aspart  0-20 Units Subcutaneous Q4H  . insulin aspart  0-5 Units Subcutaneous QHS  . insulin aspart protamine- aspart  40  Units Subcutaneous BID WC  . ipratropium-albuterol  3 mL Nebulization TID  . levothyroxine  25 mcg Oral QAC breakfast  . LORazepam  0.5 mg Intravenous Q4H  . mouth rinse  15 mL Mouth Rinse q12n4p  . methylPREDNISolone (SOLU-MEDROL) injection  80 mg Intravenous Q12H  . sodium chloride flush  3 mL Intravenous Q12H   Continuous Infusions: . sodium chloride    . sodium chloride 75 mL/hr at 12/14/18 2229  . cefTRIAXone (ROCEPHIN)  IV       LOS: 2 days    Time spent: 30 minutes    Mohannad Olivero Hoover BrunetteD Davaughn Hillyard, DO [facility] Pager [phone]  If 7PM-7AM, please contact night-coverage [email or web address] Password [email or web address] 12/15/2018, 8:54 AM

## 2018-12-16 LAB — BASIC METABOLIC PANEL
Anion gap: 8 (ref 5–15)
BUN: 29 mg/dL — ABNORMAL HIGH (ref 8–23)
CHLORIDE: 107 mmol/L (ref 98–111)
CO2: 22 mmol/L (ref 22–32)
Calcium: 8.8 mg/dL — ABNORMAL LOW (ref 8.9–10.3)
Creatinine, Ser: 0.77 mg/dL (ref 0.44–1.00)
GFR calc Af Amer: >60 mL/min (ref >60)
GFR calc non Af Amer: >60 mL/min (ref >60)
Glucose, Bld: 183 mg/dL — ABNORMAL HIGH (ref 70–99)
POTASSIUM: 4.7 mmol/L (ref 3.5–5.1)
Sodium: 137 mmol/L (ref 135–145)

## 2018-12-16 LAB — GLUCOSE, CAPILLARY
GLUCOSE-CAPILLARY: 225 mg/dL — AB (ref 70–99)
Glucose-Capillary: 253 mg/dL — ABNORMAL HIGH (ref 70–99)
Glucose-Capillary: 176 mg/dL — ABNORMAL HIGH (ref 70–99)
Glucose-Capillary: 336 mg/dL — ABNORMAL HIGH (ref 70–99)
Glucose-Capillary: 335 mg/dL — ABNORMAL HIGH (ref 70–99)
Glucose-Capillary: 370 mg/dL — ABNORMAL HIGH (ref 70–99)

## 2018-12-16 LAB — CBC
HEMATOCRIT: 38.6 % (ref 36.0–46.0)
Hemoglobin: 11.6 g/dL — ABNORMAL LOW (ref 12.0–15.0)
MCH: 24.4 pg — ABNORMAL LOW (ref 26.0–34.0)
MCHC: 30.1 g/dL (ref 30.0–36.0)
MCV: 81.3 fL (ref 80.0–100.0)
Platelets: 290 10*3/uL (ref 150–400)
RBC: 4.75 MIL/uL (ref 3.87–5.11)
RDW: 16.5 % — ABNORMAL HIGH (ref 11.5–15.5)
WBC: 10.8 10*3/uL — ABNORMAL HIGH (ref 4.0–10.5)
nRBC: 0 % (ref 0.0–0.2)

## 2018-12-16 LAB — URINE CULTURE

## 2018-12-16 MED ORDER — HYDROCOD POLST-CPM POLST ER 10-8 MG/5ML PO SUER
5.0000 mL | Freq: Once | ORAL | Status: AC
  Administered 2018-12-16: 5 mL via ORAL
  Filled 2018-12-16: qty 5

## 2018-12-16 NOTE — Progress Notes (Signed)
PROGRESS NOTE    [name] [name]  [id number] DOB: October 28, 1936 DOA: 12/13/2018 PCP: [name] Linseyondiego, Richard, MD   Brief Narrative:  Per HPI: [name] [name] a 82 y.o.femalewith medical history significant forCOPD without home oxygen, but with noted chronic steroid use, CAD, OSA with noncompliance on home CPAP machine, grade 1 diastolic dysfunction with LVEF 60 to 65%, dyslipidemia, hypothyroidism, insulin-dependent type 2 diabetes, and obesity who presented to the emergency department with a nonproductive cough as well as shortness of breath that has been worsening over the last 1 to 2 days. She has tried breathing treatments at home with minimal relief of her symptoms and denies any lower extremity edema or weight gain. She denies any fevers or chills at home. No chest pain noted.  Patient has been admitted with acute hypoxemic respiratory failure secondary to COPD exacerbation. She was noted to have some hypercapnic encephalopathy initially which has resolved overnight after close monitoring. She is also noted to have AKI and UTI with agitation noted overnight-but this is improving.  Assessment & Plan:   Principal Problem:   COPD with acute exacerbation (HCC) Active Problems:   Hypothyroidism   DM type 2 causing complication (HCC)   Obesity   Essential hypertension  1. Acute hypoxemic respiratory failure likely secondary to COPD exacerbation-persistent. Continue on IV steroids with increased dose as well as DuoNebs every 4 hours.  Continue on doxycycline. Taper steroids once weaned off oxygen. 2. Acute encephalopathy likely metabolic secondary to UTI.  Will start on Rocephin and monitor closely.  Urine cultures pending. 3. AKI- improved.  Hold further IV fluids due to hypoxemia. 4. Type 2 diabetes- improving hyperglycemia. Maintain on sliding scale insulin with resistant scale due to use of steroids.Restart home 70/30 NovoLog as she is tolerating diet now. Continue  to monitor closely. 5. Dysuria with suspicion of UTI.   Started on Rocephin as noted above and monitor urine culture. 6. Mild hyperkalemia-improving.  Continue to monitor with repeat BMP in a.m. 7. Hypertension. Continue on home blood pressure medications as her blood pressuresare now better controlled. 8. Hypothyroidism. Maintain on levothyroxine. 9. CAD. Maintain on aspirin.   DVT prophylaxis:Heparin Code Status:Full Family Communication:None at bedside Disposition Plan:Continue COPD treatment and wean oxygen as tolerated.   Continue gentle IV normal saline with addition of Rocephin for UTI with cultures pending.   Consultants:  None  Procedures:  None  Antimicrobials:  Doxycycline 12/22->  Rocephin 12/24->   Subjective: Patient seen and evaluated today with no new acute complaints or concerns.  She states that her cough and shortness of breath have been improving some.  She was noted to have some mild agitation overnight, but no falls.  This appears to be slowly improving.  Objective: Vitals:   12/15/18 2136 12/16/18 0621 12/16/18 0626 12/16/18 0746  BP: (!) 146/67 (!) 181/78 136/61   Pulse: (!) 102 71 68   Resp: 18 18    Temp: 98.1 F (36.7 C) 97.6 F (36.4 C)    TempSrc: Oral Oral    SpO2: 97% 97%  96%  Weight:   89.9 kg   Height:        Intake/Output Summary (Last 24 hours) at 12/16/2018 0934 Last data filed at 12/16/2018 0430 Gross per 24 hour  Intake 459.26 ml  Output 1550 ml  Net -1090.74 ml   Filed Weights   12/14/18 0405 12/15/18 0608 12/16/18 0626  Weight: 91.5 kg 92.5 kg 89.9 kg    Examination:  General exam: Appears calm and  comfortable  Respiratory system: Clear to auscultation. Respiratory effort normal. Cardiovascular system: S1 & S2 heard, RRR. No JVD, murmurs, rubs, gallops or clicks. No pedal edema. Gastrointestinal system: Abdomen is nondistended, soft and nontender. No organomegaly or masses felt. Normal bowel  sounds heard. Central nervous system: Alert and oriented. No focal neurological deficits. Extremities: Symmetric 5 x 5 power. Skin: No rashes, lesions or ulcers Psychiatry: Judgement and insight appear normal. Mood & affect appropriate.     Data Reviewed: I have personally reviewed following labs and imaging studies  CBC: Recent Labs  Lab 12/13/18 0419 12/14/18 0356 12/15/18 0523 12/16/18 0553  WBC 12.5* 15.5* 16.2* 10.8*  NEUTROABS 7.7  --   --   --   HGB 11.5* 11.4* 10.6* 11.6*  HCT 39.8 39.2 36.4 38.6  MCV 84.5 84.3 82.0 81.3  PLT 306 305 296 290   Basic Metabolic Panel: Recent Labs  Lab 12/13/18 0419 12/14/18 0356 12/15/18 0523 12/16/18 0553  NA 136 132* 136 137  K 4.2 5.5* 5.1 4.7  CL 105 100 107 107  CO2 24 23 24 22   GLUCOSE 201* 354* 154* 183*  BUN 18 34* 38* 29*  CREATININE 1.33* 1.79* 0.99 0.77  CALCIUM 9.1 9.5 8.8* 8.8*   GFR: Estimated Creatinine Clearance: 60.9 mL/min (by C-G formula based on SCr of 0.77 mg/dL). Liver Function Tests: No results for input(s): AST, ALT, ALKPHOS, BILITOT, PROT, ALBUMIN in the last 168 hours. No results for input(s): LIPASE, AMYLASE in the last 168 hours. No results for input(s): AMMONIA in the last 168 hours. Coagulation Profile: No results for input(s): INR, PROTIME in the last 168 hours. Cardiac Enzymes: No results for input(s): CKTOTAL, CKMB, CKMBINDEX, TROPONINI in the last 168 hours. BNP (last 3 results) No results for input(s): PROBNP in the last 8760 hours. HbA1C: No results for input(s): HGBA1C in the last 72 hours. CBG: Recent Labs  Lab 12/15/18 2006 12/15/18 2318 12/16/18 0100 12/16/18 0345 12/16/18 0850  GLUCAP 271* 319* 253* 176* 225*   Lipid Profile: No results for input(s): CHOL, HDL, LDLCALC, TRIG, CHOLHDL, LDLDIRECT in the last 72 hours. Thyroid Function Tests: No results for input(s): TSH, T4TOTAL, FREET4, T3FREE, THYROIDAB in the last 72 hours. Anemia Panel: No results for input(s):  VITAMINB12, FOLATE, FERRITIN, TIBC, IRON, RETICCTPCT in the last 72 hours. Sepsis Labs: No results for input(s): PROCALCITON, LATICACIDVEN in the last 168 hours.  Recent Results (from the past 240 hour(s))  MRSA PCR Screening     Status: None   Collection Time: 12/13/18  4:47 PM  Result Value Ref Range Status   MRSA by PCR NEGATIVE NEGATIVE Final    Comment:        The GeneXpert MRSA Assay (FDA approved for NASAL specimens only), is one component of a comprehensive MRSA colonization surveillance program. It is not intended to diagnose MRSA infection nor to guide or monitor treatment for MRSA infections. Performed at [facility], [address] [place], [place] [address]          Radiology Studies: No results found.      Scheduled Meds: . amLODipine  5 mg Oral Daily  . aspirin EC  81 mg Oral Daily  . budesonide (PULMICORT) nebulizer solution  0.25 mg Nebulization BID  . chlorhexidine  15 mL Mouth Rinse BID  . citalopram  20 mg Oral Daily  . doxycycline  100 mg Oral Q12H  . gabapentin  300 mg Oral BID  . heparin  5,000 Units Subcutaneous Q8H  .  insulin aspart  0-20 Units Subcutaneous Q4H  . insulin aspart  0-5 Units Subcutaneous QHS  . insulin aspart protamine- aspart  40 Units Subcutaneous BID WC  . ipratropium-albuterol  3 mL Nebulization TID  . levothyroxine  25 mcg Oral QAC breakfast  . LORazepam  0.5 mg Intravenous Q4H  . mouth rinse  15 mL Mouth Rinse q12n4p  . methylPREDNISolone (SOLU-MEDROL) injection  80 mg Intravenous Q12H  . sodium chloride flush  3 mL Intravenous Q12H   Continuous Infusions: . sodium chloride    . cefTRIAXone (ROCEPHIN)  IV Stopped (12/15/18 1253)     LOS: 3 days    Time spent: 30 minutes    Munirah Doerner Hoover Brunette, DO [facility] Pager [phone]  If 7PM-7AM, please contact night-coverage [email or web address] Password [facility] 12/16/2018, 9:34 AM

## 2018-12-16 NOTE — Progress Notes (Signed)
Patient refusing CPAP. Hospital unit still at bedside. 

## 2018-12-17 LAB — CBC
HCT: 39.8 % (ref 36.0–46.0)
Hemoglobin: 11.7 g/dL — ABNORMAL LOW (ref 12.0–15.0)
MCH: 23.9 pg — ABNORMAL LOW (ref 26.0–34.0)
MCHC: 29.4 g/dL — ABNORMAL LOW (ref 30.0–36.0)
MCV: 81.4 fL (ref 80.0–100.0)
Platelets: 303 10*3/uL (ref 150–400)
RBC: 4.89 MIL/uL (ref 3.87–5.11)
RDW: 16.7 % — ABNORMAL HIGH (ref 11.5–15.5)
WBC: 11.7 10*3/uL — ABNORMAL HIGH (ref 4.0–10.5)
nRBC: 0.2 % (ref 0.0–0.2)

## 2018-12-17 LAB — BASIC METABOLIC PANEL
Anion gap: 7 (ref 5–15)
BUN: 25 mg/dL — ABNORMAL HIGH (ref 8–23)
CALCIUM: 9.2 mg/dL (ref 8.9–10.3)
CO2: 26 mmol/L (ref 22–32)
Chloride: 106 mmol/L (ref 98–111)
Creatinine, Ser: 0.8 mg/dL (ref 0.44–1.00)
GFR calc Af Amer: >60 mL/min (ref >60)
GFR calc non Af Amer: >60 mL/min (ref >60)
Glucose, Bld: 173 mg/dL — ABNORMAL HIGH (ref 70–99)
Potassium: 4.5 mmol/L (ref 3.5–5.1)
Sodium: 139 mmol/L (ref 135–145)

## 2018-12-17 LAB — GLUCOSE, CAPILLARY
GLUCOSE-CAPILLARY: 259 mg/dL — AB (ref 70–99)
Glucose-Capillary: 172 mg/dL — ABNORMAL HIGH (ref 70–99)
Glucose-Capillary: 175 mg/dL — ABNORMAL HIGH (ref 70–99)
Glucose-Capillary: 403 mg/dL — ABNORMAL HIGH (ref 70–99)

## 2018-12-17 MED ORDER — METHYLPREDNISOLONE SODIUM SUCC 40 MG IJ SOLR
40.0000 mg | Freq: Two times a day (BID) | INTRAMUSCULAR | Status: DC
  Administered 2018-12-17: 40 mg via INTRAVENOUS
  Filled 2018-12-17: qty 1

## 2018-12-17 MED ORDER — CEFDINIR 300 MG PO CAPS: 300.0000 mg | ORAL_CAPSULE | Freq: Two times a day (BID) | ORAL | 0 refills | Status: AC

## 2018-12-17 MED ORDER — INSULIN NPH ISOPHANE & REGULAR (70-30) 100 UNIT/ML ~~LOC~~ SUSP: 45.0000 [IU] | Freq: Two times a day (BID) | SUBCUTANEOUS | 11 refills | Status: DC

## 2018-12-17 MED ORDER — INSULIN ASPART PROT & ASPART (70-30 MIX) 100 UNIT/ML ~~LOC~~ SUSP
45.0000 [IU] | Freq: Two times a day (BID) | SUBCUTANEOUS | Status: DC
  Filled 2018-12-17: qty 10

## 2018-12-17 NOTE — Care Management Note (Signed)
Case Management Note  Patient Details  Name: [name] AuerRuby J [name] MRN: [id number] Date of Birth: 10/14/1936  Subjective/Objective:      Admitted with COPD. Pt from home, lives with spouse who is also dissabled. Pt has insurance and PCP. [facility] Does not qualify for home oxygen. Has ambulated off O2 prior to DC.               Action/Plan: DC home today with self care. No CM needs noted at this time.   Expected Discharge Date:  12/17/18               Expected Discharge Plan:  Home with self care  In-House Referral:  NA  Discharge planning Services  CM Consult  Post Acute Care Choice:  NA Choice offered to:  NA  Status of Service:  Completed, signed off  If discussed at Long Length of Stay Meetings, dates discussed:    Additional Comments:  Malcolm MetroChildress, Torren Maffeo Demske, RN 12/17/2018, 4:32 PM

## 2018-12-17 NOTE — Care Management Important Message (Signed)
Important Message  Patient Details  Name: [name] [name] [name] [name] Date of Birth: [birth year]   Medicare Important Message Given:  Yes    Malcolm MetroChildress, Midge Momon Demske, RN 12/17/2018, 1:46 PM

## 2018-12-17 NOTE — Discharge Summary (Signed)
Physician Discharge Summary  [name] J Matlock JYN:[id number] DOB: 1936/02/25 DOA: 12/13/2018  PCP: Oval Linseyondiego, Richard, MD  Admit date: 12/13/2018  Discharge date: 12/17/2018  Admitted From: Home  Disposition: Home  Recommendations for Outpatient Follow-up:  1. Follow up with PCP in 1-2 weeks 2. Please recheck blood glucose levels in office in 1 week visit and adjust insulin as needed.  Glucose level should improve with reduction of steroid dosage. 3. Continue on home chronic prednisone. 4. Continue on Omnicef for UTI for 5 more days with no significant findings on culture  Home Health: None  Equipment/Devices: None  Discharge Condition: Stable  CODE STATUS: Full  Diet recommendation: Heart Healthy/carb modified  Brief/Interim Summary: Per HPI: [name] J McMillionis a 82 y.o.femalewith medical history significant forCOPD without home oxygen, but with noted chronic steroid use, CAD, OSA with noncompliance on home CPAP machine, grade 1 diastolic dysfunction with LVEF 60 to 65%, dyslipidemia, hypothyroidism, insulin-dependent type 2 diabetes, and obesity who presented to the emergency department with a nonproductive cough as well as shortness of breath that has been worsening over the last 1 to 2 days. She has tried breathing treatments at home with minimal relief of her symptoms and denies any lower extremity edema or weight gain. She denies any fevers or chills at home. No chest pain noted.  Patient had been admitted with acute hypoxemic respiratory failure secondary to COPD exacerbation.  She was also initially thought to have some hypercapnic encephalopathy which resolved fairly quickly without use of BiPAP.  She was then moved from ICU to the general medical floor and was being treated for acute kidney injury and was also noted to have a urinary tract infection which led to further encephalopathic changes.  Her urine cultures have remained negative, however with treatment with IV  Rocephin her mentation had improved considerably.  She has no further shortness of breath or wheezing after treatment with duo nebs and steroids and she is able to ambulate without any oxygen requirements at this time.  She will remain on Omnicef for 5 more days to complete the course of treatment.  No other significant acute events noted during the course of this admission.  She needs to follow-up with her PCP in the next 1 week and carefully monitor her blood glucose levels as they are elevated on day of discharge.  Her home 70/30 insulin has been increased to 45 units twice daily and her steroid dose has been dramatically reduced back to her usual baseline chronic prednisone that she takes twice daily.  This should help ensure that her blood glucose levels remain stable.  Discharge Diagnoses:  Principal Problem:   COPD with acute exacerbation (HCC) Active Problems:   Hypothyroidism   DM type 2 causing complication (HCC)   Obesity   Essential hypertension  Principal discharge diagnosis: Acute hypoxemic respiratory failure with associated encephalopathy secondary to acute COPD exacerbation.  Discharge Instructions  Discharge Instructions    Diet - low sodium heart healthy   Complete by:  As directed    Increase activity slowly   Complete by:  As directed      Allergies as of 12/17/2018      Reactions   Codeine Itching      Medication List    STOP taking these medications   docusate sodium 100 MG capsule Commonly known as:  COLACE   MUCUS RELIEF DM 30-600 MG Tb12   sulfamethoxazole-trimethoprim 800-160 MG tablet Commonly known as:  BACTRIM DS,SEPTRA DS  TAKE these medications   amLODipine 5 MG tablet Commonly known as:  NORVASC Take 5 mg by mouth daily.   aspirin EC 81 MG tablet Take 1 tablet (81 mg total) by mouth daily.   cefdinir 300 MG capsule Commonly known as:  OMNICEF Take 1 capsule (300 mg total) by mouth 2 (two) times daily for 5 days. What changed:  when  to take this   citalopram 20 MG tablet Commonly known as:  CELEXA Take 1 tablet (20 mg total) by mouth daily.   gabapentin 300 MG capsule Commonly known as:  NEURONTIN Take 300 mg by mouth 2 (two) times daily.   insulin NPH-regular Human (70-30) 100 UNIT/ML injection Inject 45 Units into the skin 2 (two) times daily. What changed:  how much to take   ipratropium-albuterol 0.5-2.5 (3) MG/3ML Soln Commonly known as:  DUONEB Inhale 3 mLs into the lungs 4 (four) times daily as needed (for shortness of breath).   levothyroxine 25 MCG tablet Commonly known as:  SYNTHROID, LEVOTHROID Take 25 mcg by mouth daily.   LORazepam 1 MG tablet Commonly known as:  ATIVAN Take 1 tablet (1 mg total) by mouth at bedtime.   metFORMIN 500 MG tablet Commonly known as:  GLUCOPHAGE Take 500 mg by mouth 2 (two) times daily with a meal.   nitroGLYCERIN 0.4 MG SL tablet Commonly known as:  NITROSTAT Place 0.4 mg under the tongue every 5 (five) minutes as needed for chest pain.   oxyCODONE 5 MG immediate release tablet Commonly known as:  Oxy IR/ROXICODONE Take 1 tablet (5 mg total) by mouth every 4 (four) hours as needed (Moderate pain).   predniSONE 20 MG tablet Commonly known as:  DELTASONE Take 20 mg by mouth 2 (two) times daily with a meal.   triamcinolone cream 0.1 % Commonly known as:  KENALOG Apply 1 application topically 2 (two) times daily as needed (for flares).      Follow-up Information    Oval Linsey, MD Follow up in 1 week(s).   Specialty:  Internal Medicine Contact information: [address] [place] [address] [phone]          Allergies  Allergen Reactions  . Codeine Itching    Consultations:  None   Procedures/Studies: Dg Chest Port 1 View  Result Date: 12/13/2018 CLINICAL DATA:  Cough, congestion and wheezing beginning yesterday. History of COPD. EXAM: PORTABLE CHEST 1 VIEW COMPARISON:  Chest radiographs September 18, 2018 FINDINGS:  Cardiac silhouette is upper limits of normal size, mediastinal silhouette is not suspicious. Status post median sternotomy. Pulmonary vascular congestion without pleural effusion or focal consolidation. LEFT upper lobe scarring. No pneumothorax. RIGHT humeral head suture anchors and plate and screw fixation. Soft tissue planes are non suspicious. IMPRESSION: 1. Borderline cardiomegaly and pulmonary vascular congestion. Electronically Signed   By: Awilda Metro M.D.   On: 12/13/2018 05:00    Discharge Exam: Vitals:   12/17/18 0751 12/17/18 0756  BP:    Pulse:    Resp:    Temp:    SpO2: 95% 95%   Vitals:   12/17/18 0500 12/17/18 0601 12/17/18 0751 12/17/18 0756  BP:  (!) 153/70    Pulse:  80    Resp:  18    Temp:  98.1 F (36.7 C)    TempSrc:  Oral    SpO2:  94% 95% 95%  Weight: 88.8 kg     Height:        General: Pt is alert, awake,  not in acute distress Cardiovascular: RRR, S1/S2 +, no rubs, no gallops Respiratory: CTA bilaterally, no wheezing, no rhonchi Abdominal: Soft, NT, ND, bowel sounds + Extremities: no edema, no cyanosis    The results of significant diagnostics from this hospitalization (including imaging, microbiology, ancillary and laboratory) are listed below for reference.     Microbiology: Recent Results (from the past 240 hour(s))  MRSA PCR Screening     Status: None   Collection Time: 12/13/18  4:47 PM  Result Value Ref Range Status   MRSA by PCR NEGATIVE NEGATIVE Final    Comment:        The GeneXpert MRSA Assay (FDA approved for NASAL specimens only), is one component of a comprehensive MRSA colonization surveillance program. It is not intended to diagnose MRSA infection nor to guide or monitor treatment for MRSA infections. Performed at [facility], [address], [place], [place] [address]   Culture, Urine     Status: None   Collection Time: 12/14/18 11:55 AM  Result Value Ref Range Status   Specimen Description   Final    URINE,  CLEAN CATCH Performed at [facility], [address] [place], [place] [address]    Special Requests   Final    NONE Performed at [facility], [address], [place], [place] [address]    Culture   Final    Multiple bacterial morphotypes present, none predominant. Suggest appropriate recollection if clinically indicated.   Report Status 12/16/2018 FINAL  Final     Labs: BNP (last 3 results) Recent Labs    02/28/18 1708 09/13/18 0026 12/13/18 0419  BNP 72.0 63.0 475.0*   Basic Metabolic Panel: Recent Labs  Lab 12/13/18 0419 12/14/18 0356 12/15/18 0523 12/16/18 0553 12/17/18 0502  NA 136 132* 136 137 139  K 4.2 5.5* 5.1 4.7 4.5  CL 105 100 107 107 106  CO2 24 23 24 22 26   GLUCOSE 201* 354* 154* 183* 173*  BUN 18 34* 38* 29* 25*  CREATININE 1.33* 1.79* 0.99 0.77 0.80  CALCIUM 9.1 9.5 8.8* 8.8* 9.2   Liver Function Tests: No results for input(s): AST, ALT, ALKPHOS, BILITOT, PROT, ALBUMIN in the last 168 hours. No results for input(s): LIPASE, AMYLASE in the last 168 hours. No results for input(s): AMMONIA in the last 168 hours. CBC: Recent Labs  Lab 12/13/18 0419 12/14/18 0356 12/15/18 0523 12/16/18 0553 12/17/18 0502  WBC 12.5* 15.5* 16.2* 10.8* 11.7*  NEUTROABS 7.7  --   --   --   --   HGB 11.5* 11.4* 10.6* 11.6* 11.7*  HCT 39.8 39.2 36.4 38.6 39.8  MCV 84.5 84.3 82.0 81.3 81.4  PLT 306 305 296 290 303   Cardiac Enzymes: No results for input(s): CKTOTAL, CKMB, CKMBINDEX, TROPONINI in the last 168 hours. BNP: Invalid input(s): POCBNP CBG: Recent Labs  Lab 12/16/18 2039 12/17/18 0017 12/17/18 0407 12/17/18 0738 12/17/18 1148  GLUCAP 370* 259* 172* 175* 403*   D-Dimer No results for input(s): DDIMER in the last 72 hours. Hgb A1c No results for input(s): HGBA1C in the last 72 hours. Lipid Profile No results for input(s): CHOL, HDL, LDLCALC, TRIG, CHOLHDL, LDLDIRECT in the last 72 hours. Thyroid function studies No results for input(s): TSH,  T4TOTAL, T3FREE, THYROIDAB in the last 72 hours.  Invalid input(s): FREET3 Anemia work up No results for input(s): VITAMINB12, FOLATE, FERRITIN, TIBC, IRON, RETICCTPCT in the last 72 hours. Urinalysis    Component Value Date/Time   COLORURINE YELLOW 12/14/2018 0865  APPEARANCEUR HAZY (A) 12/14/2018 0653   LABSPEC 1.013 12/14/2018 0653   PHURINE 5.0 12/14/2018 0653   GLUCOSEU >=500 (A) 12/14/2018 0653   HGBUR NEGATIVE 12/14/2018 0653   BILIRUBINUR NEGATIVE 12/14/2018 0653   KETONESUR NEGATIVE 12/14/2018 0653   PROTEINUR NEGATIVE 12/14/2018 0653   UROBILINOGEN 0.2 08/29/2015 1842   NITRITE NEGATIVE 12/14/2018 0653   LEUKOCYTESUR LARGE (A) 12/14/2018 0653   Sepsis Labs Invalid input(s): PROCALCITONIN,  WBC,  LACTICIDVEN Microbiology Recent Results (from the past 240 hour(s))  MRSA PCR Screening     Status: None   Collection Time: 12/13/18  4:47 PM  Result Value Ref Range Status   MRSA by PCR NEGATIVE NEGATIVE Final    Comment:        The GeneXpert MRSA Assay (FDA approved for NASAL specimens only), is one component of a comprehensive MRSA colonization surveillance program. It is not intended to diagnose MRSA infection nor to guide or monitor treatment for MRSA infections. Performed at [facility], [address] [place], [place] [address]   Culture, Urine     Status: None   Collection Time: 12/14/18 11:55 AM  Result Value Ref Range Status   Specimen Description   Final    URINE, CLEAN CATCH Performed at [facility], [address] [place], [place] [address]    Special Requests   Final    NONE Performed at [facility], [address] [place], [place] [address]    Culture   Final    Multiple bacterial morphotypes present, none predominant. Suggest appropriate recollection if clinically indicated.   Report Status 12/16/2018 FINAL  Final     Time coordinating discharge: 35 minutes  SIGNED:   Erick BlinksPratik D Emmalise Huard, DO [facility] 12/17/2018, 1:34 PM Pager  [phone]  If 7PM-7AM, please contact night-coverage [email or web address] Password [email or web address]

## 2018-12-17 NOTE — Progress Notes (Signed)
Inpatient Diabetes Program Recommendations  AACE/ADA: New Consensus Statement on Inpatient Glycemic Control (2015)  Target Ranges:  Prepandial:   less than 140 mg/dL      Peak postprandial:   less than 180 mg/dL (1-2 hours)      Critically ill patients:  140 - 180 mg/dL   Results for [name] [name], [name] [name] (MRN [id number]) as of 12/17/2018 12:02  Ref. Range 12/15/2018 23:18 12/16/2018 01:00 12/16/2018 03:45 12/16/2018 08:50 12/16/2018 11:53 12/16/2018 17:02 12/16/2018 20:39  Glucose-Capillary Latest Ref Range: 70 - 99 mg/dL 409319 (H)  15 units NOVOLOG  253 (H) 176 (H)  4 units NOVOLOG  225 (H)  4 units NOVOLOG +  40 units 70/30 Insulin  336 (H)  15 units NOVOLOG  335 (H)  15 units NOVOLOG +  40 units 70/30 Insulin  370 (H)  20 units NOVOLOG    Results for [name] [name], [name] [name] (MRN [id number]) as of 12/17/2018 12:02  Ref. Range 12/17/2018 00:17 12/17/2018 04:07 12/17/2018 07:38 12/17/2018 11:48  Glucose-Capillary Latest Ref Range: 70 - 99 mg/dL 956259 (H)  11 units NOVOLOG  172 (H)  4 units NOVOLOG  175 (H)  4 units NOVOLOG +  40 units 70/30 Insulin  403 (H)     Home DM Meds: 70/30 Insulin- 40 units BID       Metformin 500 mg BID  Current Orders: 70/30 Insulin- 40 units BID      Novolog Resistant Correction Scale/ SSI (0-20 units) TID AC + HS     Note Solumedrol reduced to 40 mg BID today (was 80 mg BID).  Patient still having significant glucose elevations.    MD- Please consider the following in-hospital insulin adjustments:  Increase 70/30 Insulin to 45 units BID with meals    --Will follow patient during hospitalization--  Ambrose FinlandJeannine Johnston Nasirah Sachs RN, MSN, CDE Diabetes Coordinator Inpatient Glycemic Control Team Team Pager: [phone] (8a-5p)

## 2018-12-21 ENCOUNTER — Ambulatory Visit (HOSPITAL_COMMUNITY)
Admission: RE | Admit: 2018-12-21 | Discharge: 2018-12-21 | Disposition: A | Discharge status: Home / Self Care | Payer: Medicare HMO | Source: Ambulatory Visit | Attending: Family Medicine | Admitting: Family Medicine

## 2018-12-21 DIAGNOSIS — M545 Low back pain, unspecified: Secondary | ICD-10-CM

## 2019-01-03 ENCOUNTER — Emergency Department (HOSPITAL_COMMUNITY): Payer: Medicare HMO

## 2019-01-03 ENCOUNTER — Encounter (HOSPITAL_COMMUNITY): Payer: Self-pay | Admitting: *Deleted

## 2019-01-03 ENCOUNTER — Emergency Department (HOSPITAL_COMMUNITY)
Admission: EM | Admit: 2019-01-03 | Discharge: 2019-01-04 | Disposition: A | Discharge status: Home / Self Care | Payer: Medicare HMO | Attending: Emergency Medicine | Admitting: Emergency Medicine

## 2019-01-03 ENCOUNTER — Other Ambulatory Visit: Payer: Self-pay

## 2019-01-03 DIAGNOSIS — I110 Hypertensive heart disease with heart failure: Secondary | ICD-10-CM | POA: Insufficient documentation

## 2019-01-03 DIAGNOSIS — R531 Weakness: Secondary | ICD-10-CM | POA: Diagnosis present

## 2019-01-03 DIAGNOSIS — Z7982 Long term (current) use of aspirin: Secondary | ICD-10-CM | POA: Insufficient documentation

## 2019-01-03 DIAGNOSIS — Z79899 Other long term (current) drug therapy: Secondary | ICD-10-CM | POA: Insufficient documentation

## 2019-01-03 DIAGNOSIS — Z794 Long term (current) use of insulin: Secondary | ICD-10-CM | POA: Insufficient documentation

## 2019-01-03 DIAGNOSIS — N3001 Acute cystitis with hematuria: Secondary | ICD-10-CM | POA: Diagnosis not present

## 2019-01-03 DIAGNOSIS — I2510 Atherosclerotic heart disease of native coronary artery without angina pectoris: Secondary | ICD-10-CM | POA: Insufficient documentation

## 2019-01-03 DIAGNOSIS — Z9049 Acquired absence of other specified parts of digestive tract: Secondary | ICD-10-CM | POA: Diagnosis not present

## 2019-01-03 DIAGNOSIS — J449 Chronic obstructive pulmonary disease, unspecified: Secondary | ICD-10-CM | POA: Insufficient documentation

## 2019-01-03 DIAGNOSIS — I5030 Unspecified diastolic (congestive) heart failure: Secondary | ICD-10-CM | POA: Diagnosis not present

## 2019-01-03 DIAGNOSIS — Z8673 Personal history of transient ischemic attack (TIA), and cerebral infarction without residual deficits: Secondary | ICD-10-CM | POA: Diagnosis not present

## 2019-01-03 DIAGNOSIS — E119 Type 2 diabetes mellitus without complications: Secondary | ICD-10-CM | POA: Insufficient documentation

## 2019-01-03 DIAGNOSIS — Z955 Presence of coronary angioplasty implant and graft: Secondary | ICD-10-CM | POA: Diagnosis not present

## 2019-01-03 DIAGNOSIS — I252 Old myocardial infarction: Secondary | ICD-10-CM | POA: Diagnosis not present

## 2019-01-03 DIAGNOSIS — E039 Hypothyroidism, unspecified: Secondary | ICD-10-CM | POA: Insufficient documentation

## 2019-01-03 LAB — COMPREHENSIVE METABOLIC PANEL
ALT: 17 U/L (ref 0–44)
ANION GAP: 6 (ref 5–15)
AST: 15 U/L (ref 15–41)
Albumin: 3.6 g/dL (ref 3.5–5.0)
Alkaline Phosphatase: 51 U/L (ref 38–126)
BUN: 13 mg/dL (ref 8–23)
CHLORIDE: 105 mmol/L (ref 98–111)
CO2: 26 mmol/L (ref 22–32)
Calcium: 8.9 mg/dL (ref 8.9–10.3)
Creatinine, Ser: 0.65 mg/dL (ref 0.44–1.00)
GFR calc Af Amer: >60 mL/min (ref >60)
GFR calc non Af Amer: >60 mL/min (ref >60)
Glucose, Bld: 117 mg/dL — ABNORMAL HIGH (ref 70–99)
Potassium: 3.8 mmol/L (ref 3.5–5.1)
Sodium: 137 mmol/L (ref 135–145)
Total Bilirubin: 0.5 mg/dL (ref 0.3–1.2)
Total Protein: 6.7 g/dL (ref 6.5–8.1)

## 2019-01-03 LAB — URINALYSIS, COMPLETE (UACMP) WITH MICROSCOPIC
Bilirubin Urine: NEGATIVE
Glucose, UA: >=500 mg/dL — AB
Hgb urine dipstick: NEGATIVE
Ketones, ur: NEGATIVE mg/dL
NITRITE: POSITIVE — AB
Protein, ur: NEGATIVE mg/dL
SPECIFIC GRAVITY, URINE: 1.016 (ref 1.005–1.030)
pH: 5 (ref 5.0–8.0)

## 2019-01-03 LAB — CBC WITH DIFFERENTIAL/PLATELET
Abs Immature Granulocytes: 0.03 10*3/uL (ref 0.00–0.07)
BASOS PCT: 0 %
Basophils Absolute: 0 10*3/uL (ref 0.0–0.1)
Eosinophils Absolute: 0.3 10*3/uL (ref 0.0–0.5)
Eosinophils Relative: 4 %
HCT: 39.2 % (ref 36.0–46.0)
Hemoglobin: 11.3 g/dL — ABNORMAL LOW (ref 12.0–15.0)
Immature Granulocytes: 0 %
Lymphocytes Relative: 27 %
Lymphs Abs: 2.5 10*3/uL (ref 0.7–4.0)
MCH: 23.9 pg — ABNORMAL LOW (ref 26.0–34.0)
MCHC: 28.8 g/dL — ABNORMAL LOW (ref 30.0–36.0)
MCV: 83.1 fL (ref 80.0–100.0)
Monocytes Absolute: 1 10*3/uL (ref 0.1–1.0)
Monocytes Relative: 11 %
NRBC: 0 % (ref 0.0–0.2)
Neutro Abs: 5.1 10*3/uL (ref 1.7–7.7)
Neutrophils Relative %: 58 %
Platelets: 243 10*3/uL (ref 150–400)
RBC: 4.72 MIL/uL (ref 3.87–5.11)
RDW: 16.2 % — ABNORMAL HIGH (ref 11.5–15.5)
WBC: 9 10*3/uL (ref 4.0–10.5)

## 2019-01-03 LAB — TROPONIN I: Troponin I: <0.03 ng/mL (ref <0.03)

## 2019-01-03 MED ORDER — CEPHALEXIN 500 MG PO CAPS: 1000.0000 mg | ORAL_CAPSULE | Freq: Two times a day (BID) | ORAL | 0 refills | Status: AC

## 2019-01-03 MED ORDER — SODIUM CHLORIDE 0.9 % IV SOLN
1.0000 g | Freq: Once | INTRAVENOUS | Status: AC
  Administered 2019-01-03: 1 g via INTRAVENOUS
  Filled 2019-01-03: qty 10

## 2019-01-03 MED ORDER — OXYCODONE HCL 5 MG PO TABS
5.0000 mg | ORAL_TABLET | Freq: Once | ORAL | Status: AC
  Administered 2019-01-03: 5 mg via ORAL
  Filled 2019-01-03: qty 1

## 2019-01-03 NOTE — ED Notes (Signed)
Pt ambulated well with no assistance. Pt stated that she feels weak.

## 2019-01-03 NOTE — ED Triage Notes (Signed)
Pt c/o being "off balance" with dizziness that became worse yesterday, also c/o urinary frequency, burning with urination and back pain,

## 2019-01-03 NOTE — Discharge Instructions (Addendum)
Take your entire course of the antibiotics prescribed.  Rest to make sure you are drinking plenty of fluids.  Plan to see your doctor for recheck in 1 week as mentioned above, however get rechecked sooner for any new or worsening symptoms.

## 2019-01-04 NOTE — ED Provider Notes (Signed)
[facility] EMERGENCY DEPARTMENT Provider Note   CSN: [id number] Arrival date & time: 01/03/19  1849     History   Chief Complaint Chief Complaint  Patient presents with  . Weakness    HPI [name] [name] is a 83 y.o. female with a history as outlined below presenting with a a one day history of generally feeling unwell, describing generalized weakness, feeling slightly off balance with ambulation in association with increased urinary frequency and painful urination. She does have a history of cva with chronic gait instability which is not worsened today.  She reports frequent episodes of uti's, and was on bactrim suppressive therapy but has not had this medicine in several weeks.  She denies fevers, chills, n/v, flank pain, but does endorse low back pain not inconsistent with her chronic back pain symptoms. She does take oxycodone q4 hours for back pain and is currently overdue for her next dose.   The history is provided by the patient and a relative.    Past Medical History:  Diagnosis Date  . Arthritis   . CAD (coronary artery disease)    Multivessel status post CABG 2001  . Chronic obstructive pulmonary disease (HCC)   . DDD (degenerative disc disease), lumbar   . Diastolic CHF (HCC)   . Essential hypertension   . Hematuria   . History of CVA (cerebrovascular accident)    2013 - lacunar infarct right thalamus - residual left side of  lip numb  and left eye vision worse (which corrented lens implant from cataract extraction)  . History of MI (myocardial infarction)    July 2001  . Hyperlipidemia   . Hypothyroidism   . Insulin dependent type 2 diabetes mellitus (HCC)   . Lesion of bladder   . Lower urinary tract symptoms (LUTS)   . OSA (obstructive sleep apnea)   . Peripheral neuropathy   . Psoriasis   . SUI (stress urinary incontinence, female)   . Varicose veins     Patient Active Problem List   Diagnosis Date Noted  . COPD with acute exacerbation (HCC)  12/13/2018  . CVA (cerebral vascular accident) (HCC) 11/01/2018  . Syncope   . Loss of consciousness (HCC) 06/10/2018  . Hyperglycemia 06/10/2018  . Bradycardia, sinus 06/10/2018  . Diastolic dysfunction 09/29/2017  . CAD (coronary artery disease) 09/28/2017  . Acute lower UTI 09/28/2017  . Ischemic stroke (HCC) 05/10/2017  . Acute ischemic stroke (HCC) 05/10/2017  . Stroke (HCC) 08/01/2016  . Dysarthria 03/18/2016  . Anemia of chronic disease 11/19/2015  . OSA (obstructive sleep apnea) 11/19/2015  . Left facial numbness 08/29/2015  . Gait instability 06/26/2015  . COPD exacerbation (HCC) 11/13/2014  . Weakness generalized 12/10/2013  . TIA (transient ischemic attack) 06/22/2012  . Left-sided headache 06/22/2012  . Noncompliance 06/22/2012  . Noncompliance with CPAP treatment 06/22/2012  . Psoriasis 01/07/2012  . Cerebrovascular disease   . Chronic back pain   . Chronic obstructive pulmonary disease (HCC)   . DM type 2 causing complication (HCC) 02/14/2010  . Obesity 02/14/2010  . Arteriosclerotic cardiovascular disease (ASCVD) 02/14/2010  . Hypothyroidism 02/06/2010  . Hyperlipidemia 02/06/2010  . Essential hypertension 02/06/2010  . Sleep apnea 02/06/2010    Past Surgical History:  Procedure Laterality Date  . APPENDECTOMY  age 33  . CARDIAC CATHETERIZATION  1991   No sig. cad  . CARDIAC CATHETERIZATION  07-01-2000   high grade in-stent restenosis  . CARDIAC CATHETERIZATION  04-30-2001   single native vessel cad/ graft  patent  . CARDIAC CATHETERIZATION  02-13-2006  dr [place] cad with total occluded LAD  and  70% ostial of small ramus/  grafts patent/  normal ef  . CARDIAC CATHETERIZATION  01-20-2000  dr Clifton James   essentially unchanged;  severe single vessel cad with diffuse disease throughout CFX and RCA/ ef 66%;  chf with preserved LVSF  . CARDIOVASCULAR STRESS TEST  08-28-2011   DR ROTHBART   NEGATIVE NUCLEAR STUDY/  GLOBAL LVSF/  EF 65%  . CARPAL TUNNEL  RELEASE Bilateral yrs ago  . CATARACT EXTRACTION W/ INTRAOCULAR LENS  IMPLANT, BILATERAL  2013  . CHOLECYSTECTOMY  1990  . COLONOSCOPY N/A 07/26/2015   Procedure: COLONOSCOPY;  Surgeon: Malissa Hippo, MD;  Location: [facility];  Service: Endoscopy;  Laterality: N/A;  1200  . CORONARY ANGIOPLASTY  11-13-1999   balloon angioplasty to mLAD , d2 of LAD  . CORONARY ANGIOPLASTY WITH STENT PLACEMENT  01-17-2000   PCI stenting to mLAD & D2 of LAD  . CORONARY ARTERY BYPASS GRAFT  07-02-2000   DR Tressie Stalker   SVG to diagonal, LIMA to LAD  . CYSTOSCOPY W/ RETROGRADES Bilateral 08/09/2014   Procedure: CYSTOSCOPY, BILATERAL RETROGRADE, HYDRODISTENSION, BLADDER BIOPSY WITH FULGERATION, INSTILL PYRIDIUM AND MARCAINE ;  Surgeon: Anner Crete, MD;  Location: [facility];  Service: Urology;  Laterality: Bilateral;  . KNEE ARTHROSCOPY Left 10-18-2003   SYNOVECTOMY  . LUMBAR SPINE SURGERY  x2    yrs ago  . ORIF HUMERUS FRACTURE Right 04-18-2008  . REPAIR FLEXOR TENDON HAND Right 09-20-2008   CARPI RADIALIS TENDON TRANSFER TO EXTENSOR TO INDEX, MIDDLE, RING , LITTLE FINGERS AND DIGITI MININI  . ROTATOR CUFF REPAIR Right 1995  . TOTAL ABDOMINAL HYSTERECTOMY W/ BILATERAL SALPINGOOPHORECTOMY  age 89  . TRANSTHORACIC ECHOCARDIOGRAM  12-12-2013   MILD LVH/  EF 60-65%/  GRADE I DIASTOLIC DYSFUNCTION/  MILD LAE     OB History    Gravida  7   Para  7   Term  7   Preterm      AB      Living  4     SAB      TAB      Ectopic      Multiple      Live Births               Home Medications    Prior to Admission medications   Medication Sig Start Date End Date Taking? Authorizing Provider  amLODipine (NORVASC) 5 MG tablet Take 5 mg by mouth daily. 07/03/17  Yes [provider]  aspirin EC 81 MG tablet Take 1 tablet (81 mg total) by mouth daily. 11/03/18  Yes Rhetta Mura, MD  citalopram (CELEXA) 20 MG tablet Take 1 tablet (20 mg total) by mouth daily.  09/21/18  Yes [name], Gerlene Burdock, MD  gabapentin (NEURONTIN) 300 MG capsule Take 300 mg by mouth 2 (two) times daily. 09/18/17  Yes [provider]  insulin NPH-regular Human (70-30) 100 UNIT/ML injection Inject 45 Units into the skin 2 (two) times daily. 12/17/18  Yes Shah, Pratik D, DO  ipratropium-albuterol (DUONEB) 0.5-2.5 (3) MG/3ML SOLN Inhale 3 mLs into the lungs 4 (four) times daily as needed (for shortness of breath).  09/19/17  Yes [provider]  levothyroxine (SYNTHROID, LEVOTHROID) 25 MCG tablet Take 25 mcg by mouth daily.    Yes [provider]  LORazepam (ATIVAN) 1 MG tablet Take 1 tablet (1  mg total) by mouth at bedtime. 09/20/18  Yes Oval Linsey, MD  metFORMIN (GLUCOPHAGE) 500 MG tablet Take 500 mg by mouth 2 (two) times daily with a meal.     Yes [provider]  nitroGLYCERIN (NITROSTAT) 0.4 MG SL tablet Place 0.4 mg under the tongue every 5 (five) minutes as needed for chest pain.    Yes [provider]  oxyCODONE (OXY IR/ROXICODONE) 5 MG immediate release tablet Take 1 tablet (5 mg total) by mouth every 4 (four) hours as needed (Moderate pain). 06/12/18  Yes [name], Gerlene Burdock, MD  cephALEXin (KEFLEX) 500 MG capsule Take 2 capsules (1,000 mg total) by mouth 2 (two) times daily for 7 days. 01/03/19 01/10/19  Burgess Amor, PA-C    Family History Family History  Problem Relation Age of Onset  . Hypertension Mother   . Stroke Mother   . Coronary artery disease Other        Multiple first and second-degree relatives  . Aneurysm Other        Cerebral circulation  . Colon cancer Sister   . Stroke Sister   . Coronary artery disease Sister   . Clotting disorder Other        Children diagnosed with hypercoagulable state  . Coronary artery disease Brother     Social History Social History   Tobacco Use  . Smoking status: Never Smoker  . Smokeless tobacco: Never Used  Substance Use Topics  . Alcohol use: No  . Drug use: No      Allergies   Codeine   Review of Systems Review of Systems  Constitutional: Positive for fatigue. Negative for fever.  HENT: Negative for congestion and sore throat.   Eyes: Negative.   Respiratory: Negative for chest tightness and shortness of breath.   Cardiovascular: Negative for chest pain.  Gastrointestinal: Negative for abdominal pain, nausea and vomiting.  Genitourinary: Positive for dysuria, frequency and urgency.  Musculoskeletal: Positive for back pain. Negative for arthralgias, joint swelling and neck pain.  Skin: Negative.  Negative for rash and wound.  Neurological: Positive for weakness. Negative for dizziness, light-headedness, numbness and headaches.  Psychiatric/Behavioral: Negative.      Physical Exam Updated Vital Signs BP (!) 154/93   Pulse 68   Temp 98 F (36.7 C) (Oral)   Resp 16   Ht 5\' 4"  (1.626 m)   Wt 87.1 kg   SpO2 95%   BMI 32.96 kg/m   Physical Exam Vitals signs and nursing note reviewed.  Constitutional:      Appearance: Normal appearance. She is well-developed. She is obese.  HENT:     Head: Normocephalic and atraumatic.     Mouth/Throat:     Mouth: Mucous membranes are moist.  Eyes:     Conjunctiva/sclera: Conjunctivae normal.  Neck:     Musculoskeletal: Normal range of motion.  Cardiovascular:     Rate and Rhythm: Normal rate and regular rhythm.     Heart sounds: Normal heart sounds.  Pulmonary:     Effort: Pulmonary effort is normal.     Breath sounds: Normal breath sounds. No wheezing.  Abdominal:     General: Bowel sounds are normal.     Palpations: Abdomen is soft.     Tenderness: There is no abdominal tenderness. There is no right CVA tenderness or left CVA tenderness.  Musculoskeletal: Normal range of motion.  Skin:    General: Skin is warm and dry.  Neurological:     Mental Status: She is alert  and oriented to person, place, and time.     Cranial Nerves: Cranial nerves are intact.     Comments: Equal grip  strength.  No facial droop. Moves all 4 extremities.  Negative pronator drift.       ED Treatments / Results  Labs (all labs ordered are listed, but only abnormal results are displayed) Labs Reviewed  COMPREHENSIVE METABOLIC PANEL - Abnormal; Notable for the following components:      Result Value   Glucose, Bld 117 (*)    All other components within normal limits  CBC WITH DIFFERENTIAL/PLATELET - Abnormal; Notable for the following components:   Hemoglobin 11.3 (*)    MCH 23.9 (*)    MCHC 28.8 (*)    RDW 16.2 (*)    All other components within normal limits  URINALYSIS, COMPLETE (UACMP) WITH MICROSCOPIC - Abnormal; Notable for the following components:   APPearance HAZY (*)    Glucose, UA >=500 (*)    Nitrite POSITIVE (*)    Leukocytes, UA MODERATE (*)    Bacteria, UA RARE (*)    All other components within normal limits  URINE CULTURE  TROPONIN I    EKG None  Radiology Ct Head Wo Contrast  Result Date: 01/03/2019 CLINICAL DATA:  Dizziness and off balance worsened yesterday. EXAM: CT HEAD WITHOUT CONTRAST TECHNIQUE: Contiguous axial images were obtained from the base of the skull through the vertex without intravenous contrast. COMPARISON:  MRI brain 11/02/2018. CT head 11/01/2018 FINDINGS: Brain: Diffuse cerebral atrophy. Ventricular dilatation consistent with central atrophy. Low-attenuation changes in the deep white matter consistent with small vessel ischemia. No mass-effect or midline shift. No abnormal extra-axial fluid collections. Gray-white matter junctions are distinct. Basal cisterns are not effaced. No acute intracranial hemorrhage. Vascular: Moderate intracranial arterial vascular calcifications are present. Skull: Calvarium appears intact. No acute depressed skull fractures. Sinuses/Orbits: Paranasal sinuses and mastoid air cells are clear. Other: None. IMPRESSION: No acute intracranial abnormalities. Chronic atrophy and small vessel ischemic changes. Electronically  Signed   By: Burman NievesWilliam  Stevens M.D.   On: 01/03/2019 20:43    Procedures Procedures (including critical care time)  Medications Ordered in ED Medications  oxyCODONE (Oxy IR/ROXICODONE) immediate release tablet 5 mg (5 mg Oral Given 01/03/19 2055)  cefTRIAXone (ROCEPHIN) 1 g in sodium chloride 0.9 % 100 mL IVPB (0 g Intravenous Stopped 01/04/19 0000)     Initial Impression / Assessment and Plan / ED Course  I have reviewed the triage vital signs and the nursing notes.  Pertinent labs & imaging results that were available during my care of the patient were reviewed by me and considered in my medical decision making (see chart for details).     Pt with acute on chronic uti, who was on daily prophylaxis bactrim but has not taken in several weeks.  New uti today, no signs of pyelonephritis or urosepsis.  She was given IV rocephin here and will be tx with keflex. Return precautions and/or f/u with pcp discussed with pt and grandson at bedside. Encouraged rest, increased fluid intake.  The patient appears reasonably screened and/or stabilized for discharge and I doubt any other medical condition or other [facility] requiring further screening, evaluation, or treatment in the ED at this time prior to discharge.    Final Clinical Impressions(s) / ED Diagnoses   Final diagnoses:  Acute cystitis with hematuria  Weakness    ED Discharge Orders         Ordered    cephALEXin (KEFLEX) 500  MG capsule  2 times daily     01/03/19 2341           Burgess Amordol, Hajer Dwyer, PA-C 01/04/19 1311    Samuel JesterMcManus, Kathleen, DO 01/07/19 1333

## 2019-01-06 LAB — URINE CULTURE: Culture: >=100000 — AB

## 2019-01-07 ENCOUNTER — Telehealth: Payer: Self-pay

## 2019-01-07 NOTE — Telephone Encounter (Signed)
Post ED Visit - Positive Culture Follow-up: Successful Patient Follow-Up  Culture assessed and recommendations reviewed by:  []  Enzo Bi, Pharm.D. []  Celedonio Miyamoto, Pharm.D., BCPS AQ-ID []  Garvin Fila, Pharm.D., BCPS []  Georgina Pillion, Pharm.D., BCPS []  [place], [address].D., BCPS, AAHIVP []  Estella Husk, Pharm.D., BCPS, AAHIVP []  Lysle Pearl, PharmD, BCPS []  Phillips Climes, PharmD, BCPS []  Agapito Games, PharmD, BCPS []  Verlan Friends, PharmD C Devona Konig D Positive urine culture  []  Patient discharged without antimicrobial prescription and treatment is now indicated [x]  Organism is resistant to prescribed ED discharge antimicrobial []  Patient with positive blood cultures  Changes discussed with ED provider: Jodi Geralds [facility] New antibiotic prescription Macrobid 100 mg BID x 7 days Called to [facility] [phone]  Contacted patient, date 01/07/2019, time 1250   Jerry Caras 01/07/2019, 12:48 PM

## 2019-01-07 NOTE — Progress Notes (Signed)
ED Antimicrobial Stewardship Positive Culture Follow Up   [name] [name] is an 83 y.o. female who presented to [facility] on 01/03/2019 with a chief complaint of  Chief Complaint  Patient presents with  . Weakness    Recent Results (from the past 720 hour(s))  MRSA PCR Screening     Status: None   Collection Time: 12/13/18  4:47 PM  Result Value Ref Range Status   MRSA by PCR NEGATIVE NEGATIVE Final    Comment:        The GeneXpert MRSA Assay (FDA approved for NASAL specimens only), is one component of a comprehensive MRSA colonization surveillance program. It is not intended to diagnose MRSA infection nor to guide or monitor treatment for MRSA infections. Performed at [facility], [address] [place], [place] [address]   Culture, Urine     Status: None   Collection Time: 12/14/18 11:55 AM  Result Value Ref Range Status   Specimen Description   Final    URINE, CLEAN CATCH Performed at [facility], [address] [place], [place] [address]    Special Requests   Final    NONE Performed at [facility], [address] [place], [place] [address]    Culture   Final    Multiple bacterial morphotypes present, none predominant. Suggest appropriate recollection if clinically indicated.   Report Status 12/16/2018 FINAL  Final  Urine Culture     Status: Abnormal   Collection Time: 01/03/19  8:46 PM  Result Value Ref Range Status   Specimen Description   Final    URINE, CATHETERIZED Performed at [facility], [address] [place], [place] [address]    Special Requests   Final    NONE Performed at [facility], [address], [place], [place] [address]    Culture (A)  Final    >=100,000 COLONIES/mL ESCHERICHIA COLI Confirmed Extended Spectrum Beta-Lactamase Producer (ESBL).  In bloodstream infections from ESBL organisms, carbapenems are preferred over piperacillin/tazobactam. They are shown to have a lower risk of mortality.    Report Status 01/06/2019 FINAL  Final    Organism ID, Bacteria ESCHERICHIA COLI (A)  Final      Susceptibility   Escherichia coli - MIC*    AMPICILLIN >=32 RESISTANT Resistant     CEFAZOLIN >=64 RESISTANT Resistant     CEFTRIAXONE >=64 RESISTANT Resistant     CIPROFLOXACIN >=4 RESISTANT Resistant     GENTAMICIN 4 SENSITIVE Sensitive     IMIPENEM <=0.25 SENSITIVE Sensitive     NITROFURANTOIN 32 SENSITIVE Sensitive     TRIMETH/SULFA >=320 RESISTANT Resistant     AMPICILLIN/SULBACTAM >=32 RESISTANT Resistant     PIP/TAZO >=128 RESISTANT Resistant     Extended ESBL POSITIVE Resistant     * >=100,000 COLONIES/mL ESCHERICHIA COLI    [x]  Treated with cephalexin, organism resistant to prescribed antimicrobial []  Patient discharged originally without antimicrobial agent and treatment is now indicated  New antibiotic prescription: Macrobid 100mg  BID x 7 days  ED Provider: Jodi Geralds, PA-C   Ewing Schlein, PharmD PGY1 Pharmacy Resident 01/07/2019    8:38 AM Monday - Friday phone -  [phone] Saturday - Sunday phone - [phone]

## 2019-01-13 ENCOUNTER — Emergency Department (HOSPITAL_COMMUNITY)
Admission: EM | Admit: 2019-01-13 | Discharge: 2019-01-14 | Disposition: A | Discharge status: Home / Self Care | Payer: Medicare HMO | Attending: Emergency Medicine | Admitting: Emergency Medicine

## 2019-01-13 ENCOUNTER — Encounter (HOSPITAL_COMMUNITY): Payer: Self-pay | Admitting: Emergency Medicine

## 2019-01-13 ENCOUNTER — Other Ambulatory Visit: Payer: Self-pay

## 2019-01-13 ENCOUNTER — Emergency Department (HOSPITAL_COMMUNITY): Payer: Medicare HMO

## 2019-01-13 DIAGNOSIS — J069 Acute upper respiratory infection, unspecified: Secondary | ICD-10-CM | POA: Diagnosis not present

## 2019-01-13 DIAGNOSIS — Z955 Presence of coronary angioplasty implant and graft: Secondary | ICD-10-CM | POA: Diagnosis not present

## 2019-01-13 DIAGNOSIS — Z9049 Acquired absence of other specified parts of digestive tract: Secondary | ICD-10-CM | POA: Diagnosis not present

## 2019-01-13 DIAGNOSIS — J449 Chronic obstructive pulmonary disease, unspecified: Secondary | ICD-10-CM | POA: Diagnosis not present

## 2019-01-13 DIAGNOSIS — Z7982 Long term (current) use of aspirin: Secondary | ICD-10-CM | POA: Insufficient documentation

## 2019-01-13 DIAGNOSIS — I2510 Atherosclerotic heart disease of native coronary artery without angina pectoris: Secondary | ICD-10-CM | POA: Insufficient documentation

## 2019-01-13 DIAGNOSIS — Z79899 Other long term (current) drug therapy: Secondary | ICD-10-CM | POA: Insufficient documentation

## 2019-01-13 DIAGNOSIS — I5030 Unspecified diastolic (congestive) heart failure: Secondary | ICD-10-CM | POA: Insufficient documentation

## 2019-01-13 DIAGNOSIS — I110 Hypertensive heart disease with heart failure: Secondary | ICD-10-CM | POA: Diagnosis not present

## 2019-01-13 DIAGNOSIS — Z8673 Personal history of transient ischemic attack (TIA), and cerebral infarction without residual deficits: Secondary | ICD-10-CM | POA: Insufficient documentation

## 2019-01-13 DIAGNOSIS — R05 Cough: Secondary | ICD-10-CM | POA: Diagnosis present

## 2019-01-13 DIAGNOSIS — E039 Hypothyroidism, unspecified: Secondary | ICD-10-CM | POA: Insufficient documentation

## 2019-01-13 MED ORDER — ALBUTEROL SULFATE (2.5 MG/3ML) 0.083% IN NEBU
2.5000 mg | INHALATION_SOLUTION | Freq: Once | RESPIRATORY_TRACT | Status: AC
  Administered 2019-01-13: 2.5 mg via RESPIRATORY_TRACT
  Filled 2019-01-13: qty 3

## 2019-01-13 MED ORDER — HYDROCODONE-HOMATROPINE 5-1.5 MG/5ML PO SYRP
5.0000 mL | ORAL_SOLUTION | Freq: Once | ORAL | Status: AC
  Administered 2019-01-13: 5 mL via ORAL
  Filled 2019-01-13: qty 5

## 2019-01-13 NOTE — ED Notes (Signed)
Patient taken to restroom via wheelchair. Urine specimen obtained and in room.

## 2019-01-13 NOTE — ED Triage Notes (Signed)
Patient states that she took guaifenesin for cough and her esophagus began to burn. She has taken this medication in the past without incident. She is currently taking Abx for a UTI.

## 2019-01-13 NOTE — ED Provider Notes (Signed)
[facility] EMERGENCY DEPARTMENT Provider Note   CSN: [id number] Arrival date & time: 01/13/19  2233     History   Chief Complaint Chief Complaint  Patient presents with  . Cough    HPI [name] [name] is a 83 y.o. female.  Patient presents to the emergency department for evaluation of cough.  Patient reports that she has had a persistent cough.  Tonight she took guaifenesin and it made her cough worse.  She reports that it feels like it "burned her esophagus".  She comes to the ER by ambulance.  She was given a nebulizer treatment during transport.  She reports that it did not help, however also states that her coughing is much better than it was.     Past Medical History:  Diagnosis Date  . Arthritis   . CAD (coronary artery disease)    Multivessel status post CABG 2001  . Chronic obstructive pulmonary disease (HCC)   . DDD (degenerative disc disease), lumbar   . Diastolic CHF (HCC)   . Essential hypertension   . Hematuria   . History of CVA (cerebrovascular accident)    2013 - lacunar infarct right thalamus - residual left side of  lip numb  and left eye vision worse (which corrented lens implant from cataract extraction)  . History of MI (myocardial infarction)    July 2001  . Hyperlipidemia   . Hypothyroidism   . Insulin dependent type 2 diabetes mellitus (HCC)   . Lesion of bladder   . Lower urinary tract symptoms (LUTS)   . OSA (obstructive sleep apnea)   . Peripheral neuropathy   . Psoriasis   . SUI (stress urinary incontinence, female)   . Varicose veins     Patient Active Problem List   Diagnosis Date Noted  . COPD with acute exacerbation (HCC) 12/13/2018  . CVA (cerebral vascular accident) (HCC) 11/01/2018  . Syncope   . Loss of consciousness (HCC) 06/10/2018  . Hyperglycemia 06/10/2018  . Bradycardia, sinus 06/10/2018  . Diastolic dysfunction 09/29/2017  . CAD (coronary artery disease) 09/28/2017  . Acute lower UTI 09/28/2017  . Ischemic  stroke (HCC) 05/10/2017  . Acute ischemic stroke (HCC) 05/10/2017  . Stroke (HCC) 08/01/2016  . Dysarthria 03/18/2016  . Anemia of chronic disease 11/19/2015  . OSA (obstructive sleep apnea) 11/19/2015  . Left facial numbness 08/29/2015  . Gait instability 06/26/2015  . COPD exacerbation (HCC) 11/13/2014  . Weakness generalized 12/10/2013  . TIA (transient ischemic attack) 06/22/2012  . Left-sided headache 06/22/2012  . Noncompliance 06/22/2012  . Noncompliance with CPAP treatment 06/22/2012  . Psoriasis 01/07/2012  . Cerebrovascular disease   . Chronic back pain   . Chronic obstructive pulmonary disease (HCC)   . DM type 2 causing complication (HCC) 02/14/2010  . Obesity 02/14/2010  . Arteriosclerotic cardiovascular disease (ASCVD) 02/14/2010  . Hypothyroidism 02/06/2010  . Hyperlipidemia 02/06/2010  . Essential hypertension 02/06/2010  . Sleep apnea 02/06/2010    Past Surgical History:  Procedure Laterality Date  . APPENDECTOMY  age 59  . CARDIAC CATHETERIZATION  1991   No sig. cad  . CARDIAC CATHETERIZATION  07-01-2000   high grade in-stent restenosis  . CARDIAC CATHETERIZATION  04-30-2001   single native vessel cad/ graft patent  . CARDIAC CATHETERIZATION  02-13-2006  dr Jenness Cornerbrodie   2V cad with total occluded LAD  and  70% ostial of small ramus/  grafts patent/  normal ef  . CARDIAC CATHETERIZATION  01-20-2000  dr Clifton Jamesmcalhany  essentially unchanged;  severe single vessel cad with diffuse disease throughout CFX and RCA/ ef 66%;  chf with preserved LVSF  . CARDIOVASCULAR STRESS TEST  08-28-2011   DR ROTHBART   NEGATIVE NUCLEAR STUDY/  GLOBAL LVSF/  EF 65%  . CARPAL TUNNEL RELEASE Bilateral yrs ago  . CATARACT EXTRACTION W/ INTRAOCULAR LENS  IMPLANT, BILATERAL  2013  . CHOLECYSTECTOMY  1990  . COLONOSCOPY N/A 07/26/2015   Procedure: COLONOSCOPY;  Surgeon: Malissa HippoNajeeb U Rehman, MD;  Location: [facility];  Service: Endoscopy;  Laterality: N/A;  1200  . CORONARY ANGIOPLASTY   11-13-1999   balloon angioplasty to mLAD , d2 of LAD  . CORONARY ANGIOPLASTY WITH STENT PLACEMENT  01-17-2000   PCI stenting to mLAD & D2 of LAD  . CORONARY ARTERY BYPASS GRAFT  07-02-2000   DR Tressie StalkerLARENCE OWEN   SVG to diagonal, LIMA to LAD  . CYSTOSCOPY W/ RETROGRADES Bilateral 08/09/2014   Procedure: CYSTOSCOPY, BILATERAL RETROGRADE, HYDRODISTENSION, BLADDER BIOPSY WITH FULGERATION, INSTILL PYRIDIUM AND MARCAINE ;  Surgeon: Anner CreteJohn J Wrenn, MD;  [facility];  Service: Urology;  Laterality: Bilateral;  . KNEE ARTHROSCOPY Left 10-18-2003   SYNOVECTOMY  . LUMBAR SPINE SURGERY  x2    yrs ago  . ORIF HUMERUS FRACTURE Right 04-18-2008  . REPAIR FLEXOR TENDON HAND Right 09-20-2008   CARPI RADIALIS TENDON TRANSFER TO EXTENSOR TO INDEX, MIDDLE, RING , LITTLE FINGERS AND DIGITI MININI  . ROTATOR CUFF REPAIR Right 1995  . TOTAL ABDOMINAL HYSTERECTOMY W/ BILATERAL SALPINGOOPHORECTOMY  age 83  . TRANSTHORACIC ECHOCARDIOGRAM  12-12-2013   MILD LVH/  EF 60-65%/  GRADE I DIASTOLIC DYSFUNCTION/  MILD LAE     OB History    Gravida  7   Para  7   Term  7   Preterm      AB      Living  4     SAB      TAB      Ectopic      Multiple      Live Births               Home Medications    Prior to Admission medications   Medication Sig Start Date End Date Taking? Authorizing Provider  amLODipine (NORVASC) 5 MG tablet Take 5 mg by mouth daily. 07/03/17   [provider]  aspirin EC 81 MG tablet Take 1 tablet (81 mg total) by mouth daily. 11/03/18   Rhetta MuraSamtani, Jai-Gurmukh, MD  citalopram (CELEXA) 20 MG tablet Take 1 tablet (20 mg total) by mouth daily. 09/21/18   Oval Linseyondiego, Richard, MD  gabapentin (NEURONTIN) 300 MG capsule Take 300 mg by mouth 2 (two) times daily. 09/18/17   [provider]  insulin NPH-regular Human (70-30) 100 UNIT/ML injection Inject 45 Units into the skin 2 (two) times daily. 12/17/18   Sherryll BurgerShah, Pratik D, DO  ipratropium-albuterol  (DUONEB) 0.5-2.5 (3) MG/3ML SOLN Inhale 3 mLs into the lungs 4 (four) times daily as needed (for shortness of breath).  09/19/17   [provider]  levothyroxine (SYNTHROID, LEVOTHROID) 25 MCG tablet Take 25 mcg by mouth daily.     [provider]  LORazepam (ATIVAN) 1 MG tablet Take 1 tablet (1 mg total) by mouth at bedtime. 09/20/18   Oval Linseyondiego, Richard, MD  metFORMIN (GLUCOPHAGE) 500 MG tablet Take 500 mg by mouth 2 (two) times daily with a meal.      [provider]  nitroGLYCERIN (NITROSTAT) 0.4 MG  SL tablet Place 0.4 mg under the tongue every 5 (five) minutes as needed for chest pain.     [provider]  oxyCODONE (OXY IR/ROXICODONE) 5 MG immediate release tablet Take 1 tablet (5 mg total) by mouth every 4 (four) hours as needed (Moderate pain). 06/12/18   Oval Linsey, MD    Family History Family History  Problem Relation Age of Onset  . Hypertension Mother   . Stroke Mother   . Coronary artery disease Other        Multiple first and second-degree relatives  . Aneurysm Other        Cerebral circulation  . Colon cancer Sister   . Stroke Sister   . Coronary artery disease Sister   . Clotting disorder Other        Children diagnosed with hypercoagulable state  . Coronary artery disease Brother     Social History Social History   Tobacco Use  . Smoking status: Never Smoker  . Smokeless tobacco: Never Used  Substance Use Topics  . Alcohol use: No  . Drug use: No     Allergies   Codeine   Review of Systems Review of Systems  Respiratory: Positive for cough.   All other systems reviewed and are negative.    Physical Exam Updated Vital Signs BP (!) 169/101 (BP Location: Left Arm)   Pulse (!) 115   Temp 99 F (37.2 C) (Oral)   Resp (!) 21   Ht 5\' 4"  (1.626 m)   Wt 84.8 kg   SpO2 96%   BMI 32.10 kg/m   Physical Exam Vitals signs and nursing note reviewed.  Constitutional:      General: She is not in acute distress.     Appearance: Normal appearance. She is well-developed.  HENT:     Head: Normocephalic and atraumatic.     Right Ear: Hearing normal.     Left Ear: Hearing normal.     Nose: Nose normal.  Eyes:     Conjunctiva/sclera: Conjunctivae normal.     Pupils: Pupils are equal, round, and reactive to light.  Neck:     Musculoskeletal: Normal range of motion and neck supple.  Cardiovascular:     Rate and Rhythm: Regular rhythm.     Heart sounds: S1 normal and S2 normal. No murmur. No friction rub. No gallop.   Pulmonary:     Effort: Pulmonary effort is normal. No respiratory distress.     Breath sounds: Normal breath sounds.  Chest:     Chest wall: No tenderness.  Abdominal:     General: Bowel sounds are normal.     Palpations: Abdomen is soft.     Tenderness: There is no abdominal tenderness. There is no guarding or rebound. Negative signs include Murphy's sign and McBurney's sign.     Hernia: No hernia is present.  Musculoskeletal: Normal range of motion.  Skin:    General: Skin is warm and dry.     Findings: No rash.  Neurological:     Mental Status: She is alert and oriented to person, place, and time.     GCS: GCS eye subscore is 4. GCS verbal subscore is 5. GCS motor subscore is 6.     Cranial Nerves: No cranial nerve deficit.     Sensory: No sensory deficit.     Coordination: Coordination normal.  Psychiatric:        Speech: Speech normal.        Behavior: Behavior normal.  Thought Content: Thought content normal.      ED Treatments / Results  Labs (all labs ordered are listed, but only abnormal results are displayed) Labs Reviewed - No data to display  EKG None  Radiology Dg Chest 2 View  Result Date: 01/13/2019 CLINICAL DATA:  Cough EXAM: CHEST - 2 VIEW COMPARISON:  12/13/2018 FINDINGS: Post sternotomy changes. No acute consolidation or effusion. Stable cardiomediastinal silhouette. No pneumothorax. Surgical plate and fixating screws in the right humerus. Linear  scarring in the left lung apex IMPRESSION: No active cardiopulmonary disease. Electronically Signed   By: Jasmine Pang M.D.   On: 01/13/2019 23:46    Procedures Procedures (including critical care time)  Medications Ordered in ED Medications  albuterol (PROVENTIL) (2.5 MG/3ML) 0.083% nebulizer solution 2.5 mg (2.5 mg Nebulization Given 01/13/19 2357)  HYDROcodone-homatropine (HYCODAN) 5-1.5 MG/5ML syrup 5 mL (5 mLs Oral Given 01/13/19 2351)     Initial Impression / Assessment and Plan / ED Course  I have reviewed the triage vital signs and the nursing notes.  Pertinent labs & imaging results that were available during my care of the patient were reviewed by me and considered in my medical decision making (see chart for details).     Patient presents to the emergency department for evaluation of persistent cough.  She reports that the cough has been ongoing for some time but significantly worsened tonight after she took an over-the-counter cough syrup.  She feels like the syrup burned her esophagus as it went down.  She does not have any allergic symptoms.  Chest x-ray is clear, no evidence of pneumonia.  No symptomatology that would be concerning for congestive heart failure or PE.  Patient with obvious URI symptoms, treat symptomatically.  Final Clinical Impressions(s) / ED Diagnoses   Final diagnoses:  Upper respiratory tract infection, unspecified type    ED Discharge Orders    None       Little Winton, Canary Brim, MD 01/14/19 0004

## 2019-01-14 MED ORDER — BENZONATATE 200 MG PO CAPS: 200.0000 mg | ORAL_CAPSULE | Freq: Three times a day (TID) | ORAL | 0 refills | Status: DC | PRN

## 2019-01-14 MED ORDER — PREDNISONE 50 MG PO TABS
60.0000 mg | ORAL_TABLET | Freq: Once | ORAL | Status: AC
  Administered 2019-01-14: 60 mg via ORAL
  Filled 2019-01-14: qty 1

## 2019-01-14 MED ORDER — ALBUTEROL SULFATE HFA 108 (90 BASE) MCG/ACT IN AERS
2.0000 | INHALATION_SPRAY | RESPIRATORY_TRACT | Status: DC | PRN
  Administered 2019-01-14: 2 via RESPIRATORY_TRACT
  Filled 2019-01-14: qty 6.7

## 2019-01-14 MED ORDER — BENZONATATE 100 MG PO CAPS
200.0000 mg | ORAL_CAPSULE | Freq: Once | ORAL | Status: AC
  Administered 2019-01-14: 200 mg via ORAL
  Filled 2019-01-14: qty 2

## 2019-02-08 ENCOUNTER — Emergency Department (HOSPITAL_COMMUNITY): Payer: Medicare HMO

## 2019-02-08 ENCOUNTER — Other Ambulatory Visit: Payer: Self-pay

## 2019-02-08 ENCOUNTER — Emergency Department (HOSPITAL_COMMUNITY)
Admission: EM | Admit: 2019-02-08 | Discharge: 2019-02-08 | Disposition: A | Discharge status: Home / Self Care | Payer: Medicare HMO | Attending: Emergency Medicine | Admitting: Emergency Medicine

## 2019-02-08 ENCOUNTER — Encounter (HOSPITAL_COMMUNITY): Payer: Self-pay

## 2019-02-08 DIAGNOSIS — Z79899 Other long term (current) drug therapy: Secondary | ICD-10-CM | POA: Diagnosis not present

## 2019-02-08 DIAGNOSIS — I5030 Unspecified diastolic (congestive) heart failure: Secondary | ICD-10-CM | POA: Insufficient documentation

## 2019-02-08 DIAGNOSIS — I110 Hypertensive heart disease with heart failure: Secondary | ICD-10-CM | POA: Insufficient documentation

## 2019-02-08 DIAGNOSIS — Z794 Long term (current) use of insulin: Secondary | ICD-10-CM | POA: Insufficient documentation

## 2019-02-08 DIAGNOSIS — Z955 Presence of coronary angioplasty implant and graft: Secondary | ICD-10-CM | POA: Insufficient documentation

## 2019-02-08 DIAGNOSIS — Z7982 Long term (current) use of aspirin: Secondary | ICD-10-CM | POA: Diagnosis not present

## 2019-02-08 DIAGNOSIS — E119 Type 2 diabetes mellitus without complications: Secondary | ICD-10-CM | POA: Insufficient documentation

## 2019-02-08 DIAGNOSIS — Z8673 Personal history of transient ischemic attack (TIA), and cerebral infarction without residual deficits: Secondary | ICD-10-CM | POA: Diagnosis not present

## 2019-02-08 DIAGNOSIS — N390 Urinary tract infection, site not specified: Secondary | ICD-10-CM

## 2019-02-08 DIAGNOSIS — I2510 Atherosclerotic heart disease of native coronary artery without angina pectoris: Secondary | ICD-10-CM | POA: Insufficient documentation

## 2019-02-08 DIAGNOSIS — E039 Hypothyroidism, unspecified: Secondary | ICD-10-CM | POA: Diagnosis not present

## 2019-02-08 DIAGNOSIS — R42 Dizziness and giddiness: Secondary | ICD-10-CM | POA: Diagnosis not present

## 2019-02-08 DIAGNOSIS — J449 Chronic obstructive pulmonary disease, unspecified: Secondary | ICD-10-CM | POA: Diagnosis not present

## 2019-02-08 HISTORY — DX: Other chronic pain: G89.29

## 2019-02-08 LAB — URINALYSIS, ROUTINE W REFLEX MICROSCOPIC
Bacteria, UA: NONE SEEN
Bilirubin Urine: NEGATIVE
Glucose, UA: >=500 mg/dL — AB
KETONES UR: NEGATIVE mg/dL
Nitrite: NEGATIVE
PH: 5 (ref 5.0–8.0)
Protein, ur: NEGATIVE mg/dL
Specific Gravity, Urine: 1.017 (ref 1.005–1.030)
WBC, UA: >50 WBC/hpf — ABNORMAL HIGH (ref 0–5)

## 2019-02-08 LAB — DIFFERENTIAL
Abs Immature Granulocytes: 0.04 10*3/uL (ref 0.00–0.07)
Basophils Absolute: 0.1 10*3/uL (ref 0.0–0.1)
Basophils Relative: 1 %
Eosinophils Absolute: 0.2 10*3/uL (ref 0.0–0.5)
Eosinophils Relative: 2 %
Immature Granulocytes: 0 %
LYMPHS PCT: 23 %
Lymphs Abs: 2.7 10*3/uL (ref 0.7–4.0)
Monocytes Absolute: 1 10*3/uL (ref 0.1–1.0)
Monocytes Relative: 8 %
Neutro Abs: 8 10*3/uL — ABNORMAL HIGH (ref 1.7–7.7)
Neutrophils Relative %: 66 %

## 2019-02-08 LAB — COMPREHENSIVE METABOLIC PANEL
ALK PHOS: 51 U/L (ref 38–126)
ALT: 12 U/L (ref 0–44)
AST: 15 U/L (ref 15–41)
Albumin: 3.9 g/dL (ref 3.5–5.0)
Anion gap: 8 (ref 5–15)
BILIRUBIN TOTAL: 0.4 mg/dL (ref 0.3–1.2)
BUN: 14 mg/dL (ref 8–23)
CALCIUM: 9.5 mg/dL (ref 8.9–10.3)
CO2: 25 mmol/L (ref 22–32)
CREATININE: 0.72 mg/dL (ref 0.44–1.00)
Chloride: 107 mmol/L (ref 98–111)
GFR calc Af Amer: >60 mL/min (ref >60)
GFR calc non Af Amer: >60 mL/min (ref >60)
Glucose, Bld: 265 mg/dL — ABNORMAL HIGH (ref 70–99)
Potassium: 4.5 mmol/L (ref 3.5–5.1)
Sodium: 140 mmol/L (ref 135–145)
Total Protein: 6.9 g/dL (ref 6.5–8.1)

## 2019-02-08 LAB — CBC
HCT: 43.8 % (ref 36.0–46.0)
Hemoglobin: 12.5 g/dL (ref 12.0–15.0)
MCH: 23.4 pg — ABNORMAL LOW (ref 26.0–34.0)
MCHC: 28.5 g/dL — AB (ref 30.0–36.0)
MCV: 81.9 fL (ref 80.0–100.0)
Platelets: 300 10*3/uL (ref 150–400)
RBC: 5.35 MIL/uL — ABNORMAL HIGH (ref 3.87–5.11)
RDW: 17.4 % — ABNORMAL HIGH (ref 11.5–15.5)
WBC: 12 10*3/uL — ABNORMAL HIGH (ref 4.0–10.5)
nRBC: 0 % (ref 0.0–0.2)

## 2019-02-08 LAB — ETHANOL

## 2019-02-08 LAB — RAPID URINE DRUG SCREEN, HOSP PERFORMED
Amphetamines: NOT DETECTED
Barbiturates: NOT DETECTED
Benzodiazepines: NOT DETECTED
Cocaine: NOT DETECTED
Opiates: NOT DETECTED
Tetrahydrocannabinol: NOT DETECTED

## 2019-02-08 LAB — APTT: aPTT: 31 seconds (ref 24–36)

## 2019-02-08 LAB — PROTIME-INR
INR: 0.93
Prothrombin Time: 12.4 seconds (ref 11.4–15.2)

## 2019-02-08 LAB — CBG MONITORING, ED: GLUCOSE-CAPILLARY: 261 mg/dL — AB (ref 70–99)

## 2019-02-08 LAB — TROPONIN I: Troponin I: <0.03 ng/mL (ref <0.03)

## 2019-02-08 MED ORDER — CEFTRIAXONE SODIUM 1 G IJ SOLR
1.0000 g | Freq: Once | INTRAMUSCULAR | Status: AC
  Administered 2019-02-08: 1 g via INTRAMUSCULAR
  Filled 2019-02-08: qty 10

## 2019-02-08 MED ORDER — ACETAMINOPHEN 325 MG PO TABS
650.0000 mg | ORAL_TABLET | Freq: Once | ORAL | Status: AC
  Administered 2019-02-08: 650 mg via ORAL
  Filled 2019-02-08: qty 2

## 2019-02-08 MED ORDER — MECLIZINE HCL 25 MG PO TABS: 25.0000 mg | ORAL_TABLET | Freq: Three times a day (TID) | ORAL | 0 refills | Status: DC | PRN

## 2019-02-08 MED ORDER — CEPHALEXIN 500 MG PO CAPS: 500.0000 mg | ORAL_CAPSULE | Freq: Four times a day (QID) | ORAL | 0 refills | Status: DC

## 2019-02-08 MED ORDER — MECLIZINE HCL 12.5 MG PO TABS
25.0000 mg | ORAL_TABLET | Freq: Once | ORAL | Status: AC
  Administered 2019-02-08: 25 mg via ORAL
  Filled 2019-02-08: qty 2

## 2019-02-08 MED ORDER — HYDROXYZINE HCL 25 MG PO TABS: 25.0000 mg | ORAL_TABLET | Freq: Once | ORAL | Status: DC

## 2019-02-08 MED ORDER — LIDOCAINE HCL (PF) 1 % IJ SOLN
INTRAMUSCULAR | Status: AC
  Administered 2019-02-08: 2 mL
  Filled 2019-02-08: qty 2

## 2019-02-08 NOTE — ED Triage Notes (Addendum)
Pt reports that she woke up feeling off balance and unsteady. She has been dizzy and off balance all day. Pt reports HA when she woke up. Woke up at 8 am

## 2019-02-08 NOTE — Discharge Instructions (Signed)
Take the prescriptions as directed. Move slowly when changing positions. Keep yourself hydrated.  Call your regular medical doctor tomorrow to schedule a follow up appointment within the week.  Call the Neurologist tomorrow to schedule a follow up appointment within the next week. Return to the Emergency Department immediately sooner if worsening.

## 2019-02-08 NOTE — ED Notes (Signed)
Pt in MRI.

## 2019-02-08 NOTE — ED Provider Notes (Signed)
[facility] EMERGENCY DEPARTMENT Provider Note   CSN: [id number] Arrival date & time: 02/08/19  1647    History   Chief Complaint Chief Complaint  Patient presents with  . Dizziness    HPI [name] [name] is a 83 y.o. female.      Dizziness    Pt was seen at 1730. Per pt and her family, c/o unknown onset and persistence of constant "dizziness" since waking up this morning approximately 0800. Pt's family states pt was complaining of some dizziness yesterday. Pt describes the dizziness as "swimmy headed," and feeling "off balance" while walking. Denies N/V/D, no abd pain, no CP/SOB, no back or neck pain, no visual changes, no focal motor weakness, no tingling/numbness in extremities, no slurred speech, no facial droop.    Past Medical History:  Diagnosis Date  . Arthritis   . CAD (coronary artery disease)    Multivessel status post CABG 2001  . Chronic obstructive pulmonary disease (HCC)   . Chronic pain   . DDD (degenerative disc disease), lumbar   . Diastolic CHF (HCC)   . Essential hypertension   . Hematuria   . History of CVA (cerebrovascular accident)    2013 - lacunar infarct right thalamus - residual left side of  lip numb  and left eye vision worse (which corrented lens implant from cataract extraction)  . History of MI (myocardial infarction)    July 2001  . Hyperlipidemia   . Hypothyroidism   . Insulin dependent type 2 diabetes mellitus (HCC)   . Lesion of bladder   . Lower urinary tract symptoms (LUTS)   . OSA (obstructive sleep apnea)   . Peripheral neuropathy   . Psoriasis   . SUI (stress urinary incontinence, female)   . Varicose veins     Patient Active Problem List   Diagnosis Date Noted  . COPD with acute exacerbation (HCC) 12/13/2018  . CVA (cerebral vascular accident) (HCC) 11/01/2018  . Syncope   . Loss of consciousness (HCC) 06/10/2018  . Hyperglycemia 06/10/2018  . Bradycardia, sinus 06/10/2018  . Diastolic dysfunction 09/29/2017  .  CAD (coronary artery disease) 09/28/2017  . Acute lower UTI 09/28/2017  . Ischemic stroke (HCC) 05/10/2017  . Acute ischemic stroke (HCC) 05/10/2017  . Stroke (HCC) 08/01/2016  . Dysarthria 03/18/2016  . Anemia of chronic disease 11/19/2015  . OSA (obstructive sleep apnea) 11/19/2015  . Left facial numbness 08/29/2015  . Gait instability 06/26/2015  . COPD exacerbation (HCC) 11/13/2014  . Weakness generalized 12/10/2013  . TIA (transient ischemic attack) 06/22/2012  . Left-sided headache 06/22/2012  . Noncompliance 06/22/2012  . Noncompliance with CPAP treatment 06/22/2012  . Psoriasis 01/07/2012  . Cerebrovascular disease   . Chronic back pain   . Chronic obstructive pulmonary disease (HCC)   . DM type 2 causing complication (HCC) 02/14/2010  . Obesity 02/14/2010  . Arteriosclerotic cardiovascular disease (ASCVD) 02/14/2010  . Hypothyroidism 02/06/2010  . Hyperlipidemia 02/06/2010  . Essential hypertension 02/06/2010  . Sleep apnea 02/06/2010    Past Surgical History:  Procedure Laterality Date  . APPENDECTOMY  age 79  . CARDIAC CATHETERIZATION  1991   No sig. cad  . CARDIAC CATHETERIZATION  07-01-2000   high grade in-stent restenosis  . CARDIAC CATHETERIZATION  04-30-2001   single native vessel cad/ graft patent  . CARDIAC CATHETERIZATION  02-13-2006  dr [place] cad with total occluded LAD  and  70% ostial of small ramus/  grafts patent/  normal ef  .  CARDIAC CATHETERIZATION  01-20-2000  dr Clifton James   essentially unchanged;  severe single vessel cad with diffuse disease throughout CFX and RCA/ ef 66%;  chf with preserved LVSF  . CARDIOVASCULAR STRESS TEST  08-28-2011   DR ROTHBART   NEGATIVE NUCLEAR STUDY/  GLOBAL LVSF/  EF 65%  . CARPAL TUNNEL RELEASE Bilateral yrs ago  . CATARACT EXTRACTION W/ INTRAOCULAR LENS  IMPLANT, BILATERAL  2013  . CHOLECYSTECTOMY  1990  . COLONOSCOPY N/A 07/26/2015   Procedure: COLONOSCOPY;  Surgeon: Malissa Hippo, MD;  Location: [facility];  Service: Endoscopy;  Laterality: N/A;  1200  . CORONARY ANGIOPLASTY  11-13-1999   balloon angioplasty to mLAD , d2 of LAD  . CORONARY ANGIOPLASTY WITH STENT PLACEMENT  01-17-2000   PCI stenting to mLAD & D2 of LAD  . CORONARY ARTERY BYPASS GRAFT  07-02-2000   DR Tressie Stalker   SVG to diagonal, LIMA to LAD  . CYSTOSCOPY W/ RETROGRADES Bilateral 08/09/2014   Procedure: CYSTOSCOPY, BILATERAL RETROGRADE, HYDRODISTENSION, BLADDER BIOPSY WITH FULGERATION, INSTILL PYRIDIUM AND MARCAINE ;  Surgeon: Anner Crete, MD;  Location: [facility];  Service: Urology;  Laterality: Bilateral;  . KNEE ARTHROSCOPY Left 10-18-2003   SYNOVECTOMY  . LUMBAR SPINE SURGERY  x2    yrs ago  . ORIF HUMERUS FRACTURE Right 04-18-2008  . REPAIR FLEXOR TENDON HAND Right 09-20-2008   CARPI RADIALIS TENDON TRANSFER TO EXTENSOR TO INDEX, MIDDLE, RING , LITTLE FINGERS AND DIGITI MININI  . ROTATOR CUFF REPAIR Right 1995  . TOTAL ABDOMINAL HYSTERECTOMY W/ BILATERAL SALPINGOOPHORECTOMY  age 60  . TRANSTHORACIC ECHOCARDIOGRAM  12-12-2013   MILD LVH/  EF 60-65%/  GRADE I DIASTOLIC DYSFUNCTION/  MILD LAE     OB History    Gravida  7   Para  7   Term  7   Preterm      AB      Living  4     SAB      TAB      Ectopic      Multiple      Live Births               Home Medications    Prior to Admission medications   Medication Sig Start Date End Date Taking? Authorizing Provider  amLODipine (NORVASC) 5 MG tablet Take 5 mg by mouth daily. 07/03/17  Yes [provider]  aspirin EC 81 MG tablet Take 1 tablet (81 mg total) by mouth daily. 11/03/18  Yes Rhetta Mura, MD  benzonatate (TESSALON) 200 MG capsule Take 1 capsule (200 mg total) by mouth 3 (three) times daily as needed for cough. 01/14/19  Yes Pollina, Canary Brim, MD  citalopram (CELEXA) 20 MG tablet Take 1 tablet (20 mg total) by mouth daily. 09/21/18  Yes [name], Gerlene Burdock, MD  gabapentin (NEURONTIN) 300 MG  capsule Take 300 mg by mouth 2 (two) times daily. 09/18/17  Yes [provider]  insulin NPH-regular Human (70-30) 100 UNIT/ML injection Inject 45 Units into the skin 2 (two) times daily. 12/17/18  Yes Shah, Pratik D, DO  ipratropium-albuterol (DUONEB) 0.5-2.5 (3) MG/3ML SOLN Inhale 3 mLs into the lungs 4 (four) times daily as needed (for shortness of breath).  09/19/17  Yes [provider]  levothyroxine (SYNTHROID, LEVOTHROID) 25 MCG tablet Take 25 mcg by mouth daily.    Yes [provider]  LORazepam (ATIVAN) 1 MG tablet Take 1 tablet (1 mg total) by mouth at  bedtime. 09/20/18  Yes Oval Linsey, MD  metFORMIN (GLUCOPHAGE) 500 MG tablet Take 500 mg by mouth 2 (two) times daily with a meal.     Yes [provider]  nitroGLYCERIN (NITROSTAT) 0.4 MG SL tablet Place 0.4 mg under the tongue every 5 (five) minutes as needed for chest pain.    Yes [provider]  oxyCODONE (OXY IR/ROXICODONE) 5 MG immediate release tablet Take 1 tablet (5 mg total) by mouth every 4 (four) hours as needed (Moderate pain). 06/12/18  Yes Oval Linsey, MD  lisinopril (PRINIVIL,ZESTRIL) 10 MG tablet  01/14/19   [provider]  simvastatin (ZOCOR) 20 MG tablet Take 20 mg by mouth daily. 12/03/18   [provider]    Family History Family History  Problem Relation Age of Onset  . Hypertension Mother   . Stroke Mother   . Coronary artery disease Other        Multiple first and second-degree relatives  . Aneurysm Other        Cerebral circulation  . Colon cancer Sister   . Stroke Sister   . Coronary artery disease Sister   . Clotting disorder Other        Children diagnosed with hypercoagulable state  . Coronary artery disease Brother     Social History Social History   Tobacco Use  . Smoking status: Never Smoker  . Smokeless tobacco: Never Used  Substance Use Topics  . Alcohol use: No  . Drug use: No     Allergies   Codeine   Review  of Systems Review of Systems  Neurological: Positive for dizziness.   ROS: Statement: All systems negative except as marked or noted in the HPI; Constitutional: Negative for fever and chills. ; ; Eyes: Negative for eye pain, redness and discharge. ; ; ENMT: Negative for ear pain, hoarseness, nasal congestion, sinus pressure and sore throat. ; ; Cardiovascular: Negative for chest pain, palpitations, diaphoresis, dyspnea and peripheral edema. ; ; Respiratory: Negative for cough, wheezing and stridor. ; ; Gastrointestinal: Negative for nausea, vomiting, diarrhea, abdominal pain, blood in stool, hematemesis, jaundice and rectal bleeding. . ; ; Genitourinary: Negative for dysuria, flank pain and hematuria. ; ; Musculoskeletal: Negative for back pain and neck pain. Negative for swelling and trauma.; ; Skin: Negative for pruritus, rash, abrasions, blisters, bruising and skin lesion.; ; Neuro: +"dizzy," "off balance." Negative for headache, lightheadedness and neck stiffness. Negative for weakness, altered level of consciousness, altered mental status, extremity weakness, paresthesias, involuntary movement, seizure and syncope.       Physical Exam Updated Vital Signs BP (!) 162/74   Pulse 74   Temp 98.3 F (36.8 C) (Oral)   Resp 18   Ht 5\' 4"  (1.626 m)   Wt 86.2 kg   SpO2 96%   BMI 32.61 kg/m    Patient Vitals for the past 24 hrs:  BP Temp Temp src Pulse Resp SpO2 Height Weight  02/08/19 1922 (!) 156/65 - - 61 14 99 % - -  02/08/19 1848 131/89 - - 65 (!) 21 97 % - -  02/08/19 1737 (!) 162/74 - - 74 18 96 % - -  02/08/19 1655 136/75 98.3 F (36.8 C) Oral 74 18 99 % 5\' 4"  (1.626 m) 86.2 kg     19:17 Orthostatic Vital Signs LN  Orthostatic Lying   BP- Lying: 156/65  Pulse- Lying: 65      Orthostatic Sitting  BP- Sitting: 155/88  Pulse- Sitting: 72  Orthostatic Standing at 0 minutes  BP- Standing at 0 minutes: 163/74  Pulse- Standing at 0 minutes: 77      Physical  Exam 1735: Physical examination:  Nursing notes reviewed; Vital signs and O2 SAT reviewed;  Constitutional: Well developed, Well nourished, Well hydrated, In no acute distress; Head:  Normocephalic, atraumatic; Eyes: EOMI, PERRL, No scleral icterus; ENMT: Mouth and pharynx normal, Mucous membranes moist; Neck: Supple, Full range of motion, No lymphadenopathy; Cardiovascular: Regular rate and rhythm, No gallop; Respiratory: Breath sounds clear & equal bilaterally, No wheezes.  Speaking full sentences with ease, Normal respiratory effort/excursion; Chest: Nontender, Movement normal; Abdomen: Soft, Nontender, Nondistended, Normal bowel sounds; Genitourinary: No CVA tenderness; Extremities: Peripheral pulses normal, No tenderness, No edema, No calf edema or asymmetry.; Neuro: AA&Ox3, Major CN grossly intact. Speech clear.  No facial droop.  No nystagmus. Grips equal. Strength 5/5 equal bilat UE's and LE's.  DTR 2/4 equal bilat UE's and LE's.  No gross sensory deficits.  Normal cerebellar testing bilat UE's (finger-nose) and LE's (heel-shin)..; Skin: Color normal, Warm, Dry.   ED Treatments / Results  Labs (all labs ordered are listed, but only abnormal results are displayed)   EKG EKG Interpretation  Date/Time:  Monday February 08 2019 17:38:11 EST Ventricular Rate:  71 PR Interval:    QRS Duration: 106 QT Interval:  394 QTC Calculation: 429 R Axis:   -70 Text Interpretation:  Sinus or ectopic atrial rhythm Left axis deviation Left anterior fascicular block Anterior infarct, old Baseline wander When compared with ECG of 12/13/2018 No significant change was found Confirmed by Samuel Jester [phone]) on 02/08/2019 6:14:03 PM   Radiology   Procedures Procedures (including critical care time)  Medications Ordered in ED Medications  meclizine (ANTIVERT) tablet 25 mg (has no administration in time range)  acetaminophen (TYLENOL) tablet 650 mg (has no administration in time range)      Initial Impression / Assessment and Plan / ED Course  I have reviewed the triage vital signs and the nursing notes.  Pertinent labs & imaging results that were available during my care of the patient were reviewed by me and considered in my medical decision making (see chart for details).     MDM Reviewed: previous chart, nursing note and vitals Reviewed previous: labs and ECG Interpretation: labs, ECG, x-ray and MRI    Results for orders placed or performed during the hospital encounter of 02/08/19  Ethanol  Result Value Ref Range   Alcohol, Ethyl (B) <10 <10 mg/dL  Protime-INR  Result Value Ref Range   Prothrombin Time 12.4 11.4 - 15.2 seconds   INR 0.93   APTT  Result Value Ref Range   aPTT 31 24 - 36 seconds  CBC  Result Value Ref Range   WBC 12.0 (H) 4.0 - 10.5 K/uL   RBC 5.35 (H) 3.87 - 5.11 MIL/uL   Hemoglobin 12.5 12.0 - 15.0 g/dL   HCT 98.9 21.1 - 94.1 %   MCV 81.9 80.0 - 100.0 fL   MCH 23.4 (L) 26.0 - 34.0 pg   MCHC 28.5 (L) 30.0 - 36.0 g/dL   RDW 74.0 (H) 81.4 - 48.1 %   Platelets 300 150 - 400 K/uL   nRBC 0.0 0.0 - 0.2 %  Differential  Result Value Ref Range   Neutrophils Relative % 66 %   Neutro Abs 8.0 (H) 1.7 - 7.7 K/uL   Lymphocytes Relative 23 %   Lymphs Abs 2.7 0.7 - 4.0 K/uL   Monocytes  Relative 8 %   Monocytes Absolute 1.0 0.1 - 1.0 K/uL   Eosinophils Relative 2 %   Eosinophils Absolute 0.2 0.0 - 0.5 K/uL   Basophils Relative 1 %   Basophils Absolute 0.1 0.0 - 0.1 K/uL   Immature Granulocytes 0 %   Abs Immature Granulocytes 0.04 0.00 - 0.07 K/uL  Comprehensive metabolic panel  Result Value Ref Range   Sodium 140 135 - 145 mmol/L   Potassium 4.5 3.5 - 5.1 mmol/L   Chloride 107 98 - 111 mmol/L   CO2 25 22 - 32 mmol/L   Glucose, Bld 265 (H) 70 - 99 mg/dL   BUN 14 8 - 23 mg/dL   Creatinine, Ser 1.610.72 0.44 - 1.00 mg/dL   Calcium 9.5 8.9 - 09.610.3 mg/dL   Total Protein 6.9 6.5 - 8.1 g/dL   Albumin 3.9 3.5 - 5.0 g/dL   AST 15 15 - 41 U/L    ALT 12 0 - 44 U/L   Alkaline Phosphatase 51 38 - 126 U/L   Total Bilirubin 0.4 0.3 - 1.2 mg/dL   GFR calc non Af Amer >60 >60 mL/min   GFR calc Af Amer >60 >60 mL/min   Anion gap 8 5 - 15  Urine rapid drug screen (hosp performed[facility]  Result Value Ref Range   Opiates NONE DETECTED NONE DETECTED   Cocaine NONE DETECTED NONE DETECTED   Benzodiazepines NONE DETECTED NONE DETECTED   Amphetamines NONE DETECTED NONE DETECTED   Tetrahydrocannabinol NONE DETECTED NONE DETECTED   Barbiturates NONE DETECTED NONE DETECTED  Urinalysis, Routine w reflex microscopic  Result Value Ref Range   Color, Urine YELLOW YELLOW   APPearance CLOUDY (A) CLEAR   Specific Gravity, Urine 1.017 1.005 - 1.030   pH 5.0 5.0 - 8.0   Glucose, UA >=500 (A) NEGATIVE mg/dL   Hgb urine dipstick SMALL (A) NEGATIVE   Bilirubin Urine NEGATIVE NEGATIVE   Ketones, ur NEGATIVE NEGATIVE mg/dL   Protein, ur NEGATIVE NEGATIVE mg/dL   Nitrite NEGATIVE NEGATIVE   Leukocytes,Ua MODERATE (A) NEGATIVE   RBC / HPF 6-10 0 - 5 RBC/hpf   WBC, UA >50 (H) 0 - 5 WBC/hpf   Bacteria, UA NONE SEEN NONE SEEN   Squamous Epithelial / LPF 0-5 0 - 5   WBC Clumps PRESENT   Troponin I - Once  Result Value Ref Range   Troponin I <0.03 <0.03 ng/mL  CBG monitoring, ED  Result Value Ref Range   Glucose-Capillary 261 (H) 70 - 99 mg/dL    [name] Contrast (neuro Protocol) Result Date: 02/08/2019 CLINICAL DATA:  Ataxia, vertigo EXAM: MRI HEAD WITHOUT CONTRAST TECHNIQUE: Multiplanar, multiecho pulse sequences of the brain and surrounding structures were obtained without intravenous contrast. COMPARISON:  Brain MRI 11/02/2018 FINDINGS: BRAIN: There is no acute infarct, acute hemorrhage, hydrocephalus or extra-axial collection. The midline structures are normal. No midline shift or other mass effect. Diffuse confluent hyperintense T2-weighted signal within the periventricular, deep and juxtacortical white matter, most commonly due to  chronic ischemic microangiopathy. Generalized atrophy without lobar predilection. Susceptibility-sensitive sequences show no chronic microhemorrhage or superficial siderosis. Small, old left cerebellar infarct and bilateral thalamic infarcts. VASCULAR: Major intracranial arterial and venous sinus flow voids are normal. SKULL AND UPPER CERVICAL SPINE: Calvarial bone marrow signal is normal. There is no skull base mass. Visualized upper cervical spine and soft tissues are normal. SINUSES/ORBITS: No fluid levels or advanced mucosal thickening. No mastoid or middle ear effusion. The orbits are  normal. IMPRESSION: Chronic ischemic microangiopathy and generalized atrophy without acute intracranial abnormality. Electronically Signed   By: Deatra Robinson M.D.   On: 02/08/2019 18:36   Dg Chest 2 View Result Date: 02/08/2019 CLINICAL DATA:  Headache and dizziness. EXAM: CHEST - 2 VIEW COMPARISON:  01/13/2019 FINDINGS: The cardiac silhouette, mediastinal and hilar contours are within normal limits and stable. There are mild chronic bronchitic type lung changes with streaky areas of scarring change. No acute infiltrates, edema or effusions. The bony thorax is intact. IMPRESSION: Chronic lung changes but no acute pulmonary findings. Electronically Signed   By: Rudie Meyer M.D.   On: 02/08/2019 19:53    Ct Head Wo Contrast Result Date: 02/08/2019 CLINICAL DATA:  83 year old female with a history of dizziness EXAM: CT HEAD WITHOUT CONTRAST TECHNIQUE: Contiguous axial images were obtained from the base of the skull through the vertex without intravenous contrast. COMPARISON:  MR 02/08/2019, CT 01/03/2019 FINDINGS: Brain: No acute intracranial hemorrhage. No midline shift or mass effect. Gray-white differentiation maintained. Unremarkable configuration of the ventricles. Confluent hypodensity in the bilateral periventricular white matter. Vascular: Intracranial atherosclerosis of anterior circulation Skull: No acute  displaced fracture.  No aggressive bony lesions. Sinuses/Orbits: No acute finding. Other: None IMPRESSION: Negative for acute intracranial abnormality. Evidence of chronic microvascular ischemic disease Electronically Signed   By: Gilmer Mor D.O.   On: 02/08/2019 19:48    2100:   Pt not orthostatic on VS. Pt has ambulated with steady gait. MRI negative for acute CVA. Possible UTI on Udip, and pt does c/o dysuria; will dose IM rocephin, PO keflex. Workup otherwise reassuring. Pt states she feels better after antivert and wants to go home now. Dx and testing d/w pt and family.  Questions answered.  Verb understanding, agreeable to d/c home with outpt f/u.     Final Clinical Impressions(s) / ED Diagnoses   Final diagnoses:  None    ED Discharge Orders    None       Samuel Jester, DO 02/12/19 [id number]

## 2019-02-08 NOTE — ED Notes (Signed)
Pt had ambulated with the NT, Tonya, with no difficulty.

## 2019-02-08 NOTE — ED Notes (Signed)
Patient transported to CT 

## 2019-02-08 NOTE — ED Notes (Signed)
Spoke with Dr Clarene Duke regarding complaints

## 2019-02-10 LAB — URINE CULTURE: Culture: <10000 — AB

## 2019-02-12 ENCOUNTER — Inpatient Hospital Stay (HOSPITAL_COMMUNITY)
Admission: EM | Admit: 2019-02-12 | Discharge: 2019-02-22 | DRG: 641 | Disposition: A | Discharge status: Home Health | Payer: Medicare HMO | Attending: Family Medicine | Admitting: Family Medicine

## 2019-02-12 ENCOUNTER — Emergency Department (HOSPITAL_COMMUNITY): Payer: Medicare HMO

## 2019-02-12 ENCOUNTER — Other Ambulatory Visit: Payer: Self-pay

## 2019-02-12 ENCOUNTER — Encounter (HOSPITAL_COMMUNITY): Payer: Self-pay | Admitting: Emergency Medicine

## 2019-02-12 DIAGNOSIS — G8929 Other chronic pain: Secondary | ICD-10-CM | POA: Diagnosis present

## 2019-02-12 DIAGNOSIS — J410 Simple chronic bronchitis: Secondary | ICD-10-CM

## 2019-02-12 DIAGNOSIS — E1142 Type 2 diabetes mellitus with diabetic polyneuropathy: Secondary | ICD-10-CM | POA: Diagnosis present

## 2019-02-12 DIAGNOSIS — Z79890 Hormone replacement therapy (postmenopausal): Secondary | ICD-10-CM

## 2019-02-12 DIAGNOSIS — Z951 Presence of aortocoronary bypass graft: Secondary | ICD-10-CM

## 2019-02-12 DIAGNOSIS — E1144 Type 2 diabetes mellitus with diabetic amyotrophy: Secondary | ICD-10-CM | POA: Diagnosis present

## 2019-02-12 DIAGNOSIS — E785 Hyperlipidemia, unspecified: Secondary | ICD-10-CM | POA: Diagnosis present

## 2019-02-12 DIAGNOSIS — B3749 Other urogenital candidiasis: Secondary | ICD-10-CM | POA: Diagnosis present

## 2019-02-12 DIAGNOSIS — Z800 Family history of malignant neoplasm of digestive organs: Secondary | ICD-10-CM

## 2019-02-12 DIAGNOSIS — Z8249 Family history of ischemic heart disease and other diseases of the circulatory system: Secondary | ICD-10-CM

## 2019-02-12 DIAGNOSIS — J449 Chronic obstructive pulmonary disease, unspecified: Secondary | ICD-10-CM | POA: Diagnosis present

## 2019-02-12 DIAGNOSIS — Z91199 Patient's noncompliance with other medical treatment and regimen due to unspecified reason: Secondary | ICD-10-CM

## 2019-02-12 DIAGNOSIS — G4730 Sleep apnea, unspecified: Secondary | ICD-10-CM | POA: Diagnosis present

## 2019-02-12 DIAGNOSIS — I110 Hypertensive heart disease with heart failure: Secondary | ICD-10-CM | POA: Diagnosis present

## 2019-02-12 DIAGNOSIS — D638 Anemia in other chronic diseases classified elsewhere: Secondary | ICD-10-CM | POA: Diagnosis present

## 2019-02-12 DIAGNOSIS — N179 Acute kidney failure, unspecified: Secondary | ICD-10-CM | POA: Diagnosis present

## 2019-02-12 DIAGNOSIS — Z885 Allergy status to narcotic agent status: Secondary | ICD-10-CM

## 2019-02-12 DIAGNOSIS — I2510 Atherosclerotic heart disease of native coronary artery without angina pectoris: Secondary | ICD-10-CM | POA: Diagnosis not present

## 2019-02-12 DIAGNOSIS — R739 Hyperglycemia, unspecified: Secondary | ICD-10-CM | POA: Diagnosis present

## 2019-02-12 DIAGNOSIS — Z955 Presence of coronary angioplasty implant and graft: Secondary | ICD-10-CM

## 2019-02-12 DIAGNOSIS — M1990 Unspecified osteoarthritis, unspecified site: Secondary | ICD-10-CM | POA: Diagnosis present

## 2019-02-12 DIAGNOSIS — Z7982 Long term (current) use of aspirin: Secondary | ICD-10-CM

## 2019-02-12 DIAGNOSIS — E039 Hypothyroidism, unspecified: Secondary | ICD-10-CM | POA: Diagnosis present

## 2019-02-12 DIAGNOSIS — I10 Essential (primary) hypertension: Secondary | ICD-10-CM | POA: Diagnosis present

## 2019-02-12 DIAGNOSIS — I639 Cerebral infarction, unspecified: Secondary | ICD-10-CM

## 2019-02-12 DIAGNOSIS — L409 Psoriasis, unspecified: Secondary | ICD-10-CM | POA: Diagnosis present

## 2019-02-12 DIAGNOSIS — Y92009 Unspecified place in unspecified non-institutional (private) residence as the place of occurrence of the external cause: Secondary | ICD-10-CM

## 2019-02-12 DIAGNOSIS — F1120 Opioid dependence, uncomplicated: Secondary | ICD-10-CM | POA: Diagnosis present

## 2019-02-12 DIAGNOSIS — E118 Type 2 diabetes mellitus with unspecified complications: Secondary | ICD-10-CM

## 2019-02-12 DIAGNOSIS — Z6832 Body mass index (BMI) 32.0-32.9, adult: Secondary | ICD-10-CM

## 2019-02-12 DIAGNOSIS — E11649 Type 2 diabetes mellitus with hypoglycemia without coma: Secondary | ICD-10-CM | POA: Diagnosis not present

## 2019-02-12 DIAGNOSIS — Z794 Long term (current) use of insulin: Secondary | ICD-10-CM

## 2019-02-12 DIAGNOSIS — E860 Dehydration: Principal | ICD-10-CM | POA: Diagnosis present

## 2019-02-12 DIAGNOSIS — I252 Old myocardial infarction: Secondary | ICD-10-CM

## 2019-02-12 DIAGNOSIS — W19XXXA Unspecified fall, initial encounter: Secondary | ICD-10-CM

## 2019-02-12 DIAGNOSIS — E7801 Familial hypercholesterolemia: Secondary | ICD-10-CM

## 2019-02-12 DIAGNOSIS — R531 Weakness: Secondary | ICD-10-CM | POA: Diagnosis not present

## 2019-02-12 DIAGNOSIS — M5136 Other intervertebral disc degeneration, lumbar region: Secondary | ICD-10-CM | POA: Diagnosis present

## 2019-02-12 DIAGNOSIS — D72829 Elevated white blood cell count, unspecified: Secondary | ICD-10-CM | POA: Diagnosis present

## 2019-02-12 DIAGNOSIS — E669 Obesity, unspecified: Secondary | ICD-10-CM | POA: Diagnosis present

## 2019-02-12 DIAGNOSIS — Z9114 Patient's other noncompliance with medication regimen: Secondary | ICD-10-CM

## 2019-02-12 DIAGNOSIS — Z823 Family history of stroke: Secondary | ICD-10-CM

## 2019-02-12 DIAGNOSIS — G4733 Obstructive sleep apnea (adult) (pediatric): Secondary | ICD-10-CM | POA: Diagnosis present

## 2019-02-12 DIAGNOSIS — I5032 Chronic diastolic (congestive) heart failure: Secondary | ICD-10-CM | POA: Diagnosis present

## 2019-02-12 DIAGNOSIS — Z9119 Patient's noncompliance with other medical treatment and regimen: Secondary | ICD-10-CM

## 2019-02-12 DIAGNOSIS — R2681 Unsteadiness on feet: Secondary | ICD-10-CM | POA: Diagnosis present

## 2019-02-12 LAB — URINALYSIS, ROUTINE W REFLEX MICROSCOPIC
Bilirubin Urine: NEGATIVE
Glucose, UA: 150 mg/dL — AB
Hgb urine dipstick: NEGATIVE
Ketones, ur: NEGATIVE mg/dL
Nitrite: NEGATIVE
Protein, ur: 100 mg/dL — AB
RBC / HPF: >50 RBC/hpf — ABNORMAL HIGH (ref 0–5)
Specific Gravity, Urine: 1.024 (ref 1.005–1.030)
WBC, UA: >50 WBC/hpf — ABNORMAL HIGH (ref 0–5)
pH: 5 (ref 5.0–8.0)

## 2019-02-12 LAB — TROPONIN I: Troponin I: <0.03 ng/mL (ref <0.03)

## 2019-02-12 LAB — CBC WITH DIFFERENTIAL/PLATELET
ABS IMMATURE GRANULOCYTES: 0.05 10*3/uL (ref 0.00–0.07)
Basophils Absolute: 0 10*3/uL (ref 0.0–0.1)
Basophils Relative: 0 %
Eosinophils Absolute: 0.1 10*3/uL (ref 0.0–0.5)
Eosinophils Relative: 1 %
HCT: 45.2 % (ref 36.0–46.0)
Hemoglobin: 12.6 g/dL (ref 12.0–15.0)
Immature Granulocytes: 0 %
Lymphocytes Relative: 14 %
Lymphs Abs: 1.9 10*3/uL (ref 0.7–4.0)
MCH: 23.3 pg — ABNORMAL LOW (ref 26.0–34.0)
MCHC: 27.9 g/dL — ABNORMAL LOW (ref 30.0–36.0)
MCV: 83.5 fL (ref 80.0–100.0)
Monocytes Absolute: 1.1 10*3/uL — ABNORMAL HIGH (ref 0.1–1.0)
Monocytes Relative: 8 %
Neutro Abs: 10.8 10*3/uL — ABNORMAL HIGH (ref 1.7–7.7)
Neutrophils Relative %: 77 %
Platelets: 326 10*3/uL (ref 150–400)
RBC: 5.41 MIL/uL — ABNORMAL HIGH (ref 3.87–5.11)
RDW: 17.8 % — ABNORMAL HIGH (ref 11.5–15.5)
WBC: 14 10*3/uL — ABNORMAL HIGH (ref 4.0–10.5)
nRBC: 0 % (ref 0.0–0.2)

## 2019-02-12 LAB — COMPREHENSIVE METABOLIC PANEL
ALBUMIN: 3.7 g/dL (ref 3.5–5.0)
ALT: 13 U/L (ref 0–44)
AST: 14 U/L — ABNORMAL LOW (ref 15–41)
Alkaline Phosphatase: 60 U/L (ref 38–126)
Anion gap: 9 (ref 5–15)
BUN: 20 mg/dL (ref 8–23)
CO2: 25 mmol/L (ref 22–32)
Calcium: 9.4 mg/dL (ref 8.9–10.3)
Chloride: 108 mmol/L (ref 98–111)
Creatinine, Ser: 1.15 mg/dL — ABNORMAL HIGH (ref 0.44–1.00)
GFR calc Af Amer: 52 mL/min — ABNORMAL LOW (ref >60)
GFR calc non Af Amer: 45 mL/min — ABNORMAL LOW (ref >60)
GLUCOSE: 64 mg/dL — AB (ref 70–99)
Potassium: 4.2 mmol/L (ref 3.5–5.1)
Sodium: 142 mmol/L (ref 135–145)
Total Bilirubin: 0.3 mg/dL (ref 0.3–1.2)
Total Protein: 6.7 g/dL (ref 6.5–8.1)

## 2019-02-12 LAB — GLUCOSE, CAPILLARY
GLUCOSE-CAPILLARY: 224 mg/dL — AB (ref 70–99)
Glucose-Capillary: 70 mg/dL (ref 70–99)

## 2019-02-12 LAB — CBG MONITORING, ED
Glucose-Capillary: 65 mg/dL — ABNORMAL LOW (ref 70–99)
Glucose-Capillary: 120 mg/dL — ABNORMAL HIGH (ref 70–99)

## 2019-02-12 LAB — TSH: TSH: 0.959 u[IU]/mL (ref 0.350–4.500)

## 2019-02-12 MED ORDER — CITALOPRAM HYDROBROMIDE 20 MG PO TABS
20.0000 mg | ORAL_TABLET | Freq: Every day | ORAL | Status: DC
  Administered 2019-02-13 – 2019-02-22 (×10): 20 mg via ORAL
  Filled 2019-02-12 (×10): qty 1

## 2019-02-12 MED ORDER — OXYCODONE HCL 5 MG PO TABS
5.0000 mg | ORAL_TABLET | Freq: Once | ORAL | Status: AC
  Administered 2019-02-12: 5 mg via ORAL
  Filled 2019-02-12: qty 1

## 2019-02-12 MED ORDER — PANTOPRAZOLE SODIUM 40 MG PO TBEC
40.0000 mg | DELAYED_RELEASE_TABLET | Freq: Every day | ORAL | Status: DC
  Administered 2019-02-13 – 2019-02-22 (×10): 40 mg via ORAL
  Filled 2019-02-12 (×10): qty 1

## 2019-02-12 MED ORDER — GABAPENTIN 300 MG PO CAPS
300.0000 mg | ORAL_CAPSULE | Freq: Two times a day (BID) | ORAL | Status: DC
  Administered 2019-02-12 – 2019-02-22 (×20): 300 mg via ORAL
  Filled 2019-02-12 (×21): qty 1

## 2019-02-12 MED ORDER — NITROGLYCERIN 0.4 MG SL SUBL: 0.4000 mg | SUBLINGUAL_TABLET | SUBLINGUAL | Status: DC | PRN

## 2019-02-12 MED ORDER — FLUCONAZOLE 100 MG PO TABS
200.0000 mg | ORAL_TABLET | Freq: Every day | ORAL | Status: DC
  Administered 2019-02-12 – 2019-02-22 (×11): 200 mg via ORAL
  Filled 2019-02-12 (×11): qty 2

## 2019-02-12 MED ORDER — SODIUM CHLORIDE 0.9 % IV BOLUS
500.0000 mL | Freq: Once | INTRAVENOUS | Status: AC
  Administered 2019-02-12: 500 mL via INTRAVENOUS

## 2019-02-12 MED ORDER — TRAZODONE HCL 50 MG PO TABS: 25.0000 mg | ORAL_TABLET | Freq: Every evening | ORAL | Status: DC | PRN

## 2019-02-12 MED ORDER — ONDANSETRON HCL 4 MG PO TABS: 4.0000 mg | ORAL_TABLET | Freq: Four times a day (QID) | ORAL | Status: DC | PRN

## 2019-02-12 MED ORDER — ENOXAPARIN SODIUM 40 MG/0.4ML ~~LOC~~ SOLN
40.0000 mg | SUBCUTANEOUS | Status: DC
  Administered 2019-02-13 – 2019-02-21 (×9): 40 mg via SUBCUTANEOUS
  Filled 2019-02-12 (×10): qty 0.4

## 2019-02-12 MED ORDER — LORAZEPAM 1 MG PO TABS
1.0000 mg | ORAL_TABLET | Freq: Every day | ORAL | Status: DC
  Administered 2019-02-12 – 2019-02-21 (×10): 1 mg via ORAL
  Filled 2019-02-12 (×10): qty 1

## 2019-02-12 MED ORDER — ACETAMINOPHEN 325 MG PO TABS
650.0000 mg | ORAL_TABLET | Freq: Four times a day (QID) | ORAL | Status: DC | PRN
  Administered 2019-02-13 – 2019-02-22 (×5): 650 mg via ORAL
  Filled 2019-02-12 (×5): qty 2

## 2019-02-12 MED ORDER — AMLODIPINE BESYLATE 5 MG PO TABS
5.0000 mg | ORAL_TABLET | Freq: Every day | ORAL | Status: DC
  Administered 2019-02-13 – 2019-02-22 (×10): 5 mg via ORAL
  Filled 2019-02-12 (×10): qty 1

## 2019-02-12 MED ORDER — INSULIN ASPART 100 UNIT/ML ~~LOC~~ SOLN
0.0000 [IU] | Freq: Three times a day (TID) | SUBCUTANEOUS | Status: DC
  Administered 2019-02-13: 2 [IU] via SUBCUTANEOUS
  Administered 2019-02-13: 5 [IU] via SUBCUTANEOUS
  Administered 2019-02-13: 3 [IU] via SUBCUTANEOUS
  Administered 2019-02-14: 7 [IU] via SUBCUTANEOUS
  Administered 2019-02-14: 1 [IU] via SUBCUTANEOUS
  Administered 2019-02-14: 2 [IU] via SUBCUTANEOUS
  Administered 2019-02-15: 1 [IU] via SUBCUTANEOUS
  Administered 2019-02-15: 5 [IU] via SUBCUTANEOUS
  Administered 2019-02-15 – 2019-02-16 (×2): 1 [IU] via SUBCUTANEOUS
  Administered 2019-02-16: 5 [IU] via SUBCUTANEOUS
  Administered 2019-02-17: 7 [IU] via SUBCUTANEOUS
  Administered 2019-02-17: 2 [IU] via SUBCUTANEOUS
  Administered 2019-02-18: 1 [IU] via SUBCUTANEOUS
  Administered 2019-02-18: 2 [IU] via SUBCUTANEOUS
  Administered 2019-02-19: 5 [IU] via SUBCUTANEOUS

## 2019-02-12 MED ORDER — DEXTROSE 50 % IV SOLN: 25.0000 mL | Freq: Once | INTRAVENOUS | Status: DC

## 2019-02-12 MED ORDER — ONDANSETRON HCL 4 MG/2ML IJ SOLN
4.0000 mg | Freq: Four times a day (QID) | INTRAMUSCULAR | Status: DC | PRN
  Administered 2019-02-21: 4 mg via INTRAVENOUS
  Filled 2019-02-12: qty 2

## 2019-02-12 MED ORDER — ACETAMINOPHEN 650 MG RE SUPP: 650.0000 mg | Freq: Four times a day (QID) | RECTAL | Status: DC | PRN

## 2019-02-12 MED ORDER — INSULIN ASPART PROT & ASPART (70-30 MIX) 100 UNIT/ML ~~LOC~~ SUSP
30.0000 [IU] | Freq: Two times a day (BID) | SUBCUTANEOUS | Status: DC
  Administered 2019-02-13 – 2019-02-20 (×16): 30 [IU] via SUBCUTANEOUS
  Filled 2019-02-12: qty 10

## 2019-02-12 MED ORDER — IPRATROPIUM-ALBUTEROL 0.5-2.5 (3) MG/3ML IN SOLN: 3.0000 mL | Freq: Four times a day (QID) | RESPIRATORY_TRACT | Status: DC | PRN

## 2019-02-12 MED ORDER — SENNOSIDES-DOCUSATE SODIUM 8.6-50 MG PO TABS
1.0000 | ORAL_TABLET | Freq: Two times a day (BID) | ORAL | Status: DC | PRN
  Administered 2019-02-13 – 2019-02-18 (×6): 1 via ORAL
  Filled 2019-02-12 (×6): qty 1

## 2019-02-12 MED ORDER — DEXTROSE 50 % IV SOLN
INTRAVENOUS | Status: AC
  Filled 2019-02-12: qty 50

## 2019-02-12 MED ORDER — SIMVASTATIN 10 MG PO TABS
20.0000 mg | ORAL_TABLET | Freq: Every day | ORAL | Status: DC
  Administered 2019-02-12 – 2019-02-21 (×10): 20 mg via ORAL
  Filled 2019-02-12 (×10): qty 2

## 2019-02-12 MED ORDER — SODIUM CHLORIDE 0.9 % IV SOLN
INTRAVENOUS | Status: DC
  Administered 2019-02-12 – 2019-02-14 (×3): via INTRAVENOUS

## 2019-02-12 MED ORDER — OXYCODONE HCL 5 MG PO TABS
5.0000 mg | ORAL_TABLET | ORAL | Status: DC | PRN
  Administered 2019-02-13 – 2019-02-22 (×22): 5 mg via ORAL
  Filled 2019-02-12 (×22): qty 1

## 2019-02-12 MED ORDER — LEVOTHYROXINE SODIUM 50 MCG PO TABS
25.0000 ug | ORAL_TABLET | Freq: Every day | ORAL | Status: DC
  Administered 2019-02-13 – 2019-02-22 (×10): 25 ug via ORAL
  Filled 2019-02-12 (×10): qty 1

## 2019-02-12 MED ORDER — IPRATROPIUM-ALBUTEROL 0.5-2.5 (3) MG/3ML IN SOLN: 3.0000 mL | RESPIRATORY_TRACT | Status: DC | PRN

## 2019-02-12 MED ORDER — ASPIRIN EC 81 MG PO TBEC
81.0000 mg | DELAYED_RELEASE_TABLET | Freq: Every day | ORAL | Status: DC
  Administered 2019-02-13 – 2019-02-22 (×10): 81 mg via ORAL
  Filled 2019-02-12 (×10): qty 1

## 2019-02-12 NOTE — ED Triage Notes (Signed)
PT brought in from home with son for fall this am. PT states she was trying to get out of bed this am to use the restroom and her legs "gave out" and she went down onto her knees and laid herself onto her side and could not get out of the floor. EMS reports pt tried to walk with her walker with them and was unable to walk this morning. PT denies any pain for fall but states she has a known UTI and thinks it has worsened.

## 2019-02-12 NOTE — Progress Notes (Signed)
Pt dinner at bedside. Pt refused to have IV fluid hooked up at this time. Pt requested to wait until "later".

## 2019-02-12 NOTE — ED Provider Notes (Signed)
[facility] EMERGENCY DEPARTMENT Provider Note   CSN: [id number] Arrival date & time: 02/12/19  0800    History   Chief Complaint Chief Complaint  Patient presents with  . Fall    HPI [name] [name] is a 83 y.o. female.     Patient states that she got up this morning and her legs were too weak to fell the floor.  She also complains of some dizziness  The history is provided by the patient. No language interpreter was used.  Fall  This is a new problem. The current episode started 1 to 2 hours ago. The problem occurs rarely. The problem has been resolved. Pertinent negatives include no chest pain, no abdominal pain and no headaches. Nothing aggravates the symptoms. Nothing relieves the symptoms. She has tried nothing for the symptoms. The treatment provided no relief.    Past Medical History:  Diagnosis Date  . Arthritis   . CAD (coronary artery disease)    Multivessel status post CABG 2001  . Chronic obstructive pulmonary disease (HCC)   . Chronic pain   . DDD (degenerative disc disease), lumbar   . Diastolic CHF (HCC)   . Essential hypertension   . Hematuria   . History of CVA (cerebrovascular accident)    2013 - lacunar infarct right thalamus - residual left side of  lip numb  and left eye vision worse (which corrented lens implant from cataract extraction)  . History of MI (myocardial infarction)    July 2001  . Hyperlipidemia   . Hypothyroidism   . Insulin dependent type 2 diabetes mellitus (HCC)   . Lesion of bladder   . Lower urinary tract symptoms (LUTS)   . OSA (obstructive sleep apnea)   . Peripheral neuropathy   . Psoriasis   . SUI (stress urinary incontinence, female)   . Varicose veins     Patient Active Problem List   Diagnosis Date Noted  . Generalized weakness 02/12/2019  . Fall at home 02/12/2019  . Symptomatic Yeast UTI 02/12/2019  . COPD with acute exacerbation (HCC) 12/13/2018  . CVA (cerebral vascular accident) (HCC) 11/01/2018  .  Syncope   . Loss of consciousness (HCC) 06/10/2018  . Hyperglycemia 06/10/2018  . Bradycardia, sinus 06/10/2018  . Diastolic dysfunction 09/29/2017  . CAD (coronary artery disease) 09/28/2017  . Acute lower UTI 09/28/2017  . Ischemic stroke (HCC) 05/10/2017  . Acute ischemic stroke (HCC) 05/10/2017  . Stroke (HCC) 08/01/2016  . Dysarthria 03/18/2016  . Anemia of chronic disease 11/19/2015  . OSA (obstructive sleep apnea) 11/19/2015  . Left facial numbness 08/29/2015  . Gait instability 06/26/2015  . COPD exacerbation (HCC) 11/13/2014  . Weakness generalized 12/10/2013  . TIA (transient ischemic attack) 06/22/2012  . Left-sided headache 06/22/2012  . Noncompliance 06/22/2012  . Noncompliance with CPAP treatment 06/22/2012  . Psoriasis 01/07/2012  . Cerebrovascular disease   . Chronic back pain   . Chronic obstructive pulmonary disease (HCC)   . DM type 2 causing complication (HCC) 02/14/2010  . Obesity 02/14/2010  . Arteriosclerotic cardiovascular disease (ASCVD) 02/14/2010  . Hypothyroidism 02/06/2010  . Hyperlipidemia 02/06/2010  . Essential hypertension 02/06/2010  . Sleep apnea 02/06/2010    Past Surgical History:  Procedure Laterality Date  . APPENDECTOMY  age 78  . CARDIAC CATHETERIZATION  1991   No sig. cad  . CARDIAC CATHETERIZATION  07-01-2000   high grade in-stent restenosis  . CARDIAC CATHETERIZATION  04-30-2001   single native vessel cad/ graft patent  .  CARDIAC CATHETERIZATION  02-13-2006  dr [place] cad with total occluded LAD  and  70% ostial of small ramus/  grafts patent/  normal ef  . CARDIAC CATHETERIZATION  01-20-2000  dr Clifton James   essentially unchanged;  severe single vessel cad with diffuse disease throughout CFX and RCA/ ef 66%;  chf with preserved LVSF  . CARDIOVASCULAR STRESS TEST  08-28-2011   DR ROTHBART   NEGATIVE NUCLEAR STUDY/  GLOBAL LVSF/  EF 65%  . CARPAL TUNNEL RELEASE Bilateral yrs ago  . CATARACT EXTRACTION W/ INTRAOCULAR LENS   IMPLANT, BILATERAL  2013  . CHOLECYSTECTOMY  1990  . COLONOSCOPY N/A 07/26/2015   Procedure: COLONOSCOPY;  Surgeon: Malissa Hippo, MD;  Location: [facility];  Service: Endoscopy;  Laterality: N/A;  1200  . CORONARY ANGIOPLASTY  11-13-1999   balloon angioplasty to mLAD , d2 of LAD  . CORONARY ANGIOPLASTY WITH STENT PLACEMENT  01-17-2000   PCI stenting to mLAD & D2 of LAD  . CORONARY ARTERY BYPASS GRAFT  07-02-2000   DR Tressie Stalker   SVG to diagonal, LIMA to LAD  . CYSTOSCOPY W/ RETROGRADES Bilateral 08/09/2014   Procedure: CYSTOSCOPY, BILATERAL RETROGRADE, HYDRODISTENSION, BLADDER BIOPSY WITH FULGERATION, INSTILL PYRIDIUM AND MARCAINE ;  Surgeon: Anner Crete, MD;  Location: [facility];  Service: Urology;  Laterality: Bilateral;  . KNEE ARTHROSCOPY Left 10-18-2003   SYNOVECTOMY  . LUMBAR SPINE SURGERY  x2    yrs ago  . ORIF HUMERUS FRACTURE Right 04-18-2008  . REPAIR FLEXOR TENDON HAND Right 09-20-2008   CARPI RADIALIS TENDON TRANSFER TO EXTENSOR TO INDEX, MIDDLE, RING , LITTLE FINGERS AND DIGITI MININI  . ROTATOR CUFF REPAIR Right 1995  . TOTAL ABDOMINAL HYSTERECTOMY W/ BILATERAL SALPINGOOPHORECTOMY  age 16  . TRANSTHORACIC ECHOCARDIOGRAM  12-12-2013   MILD LVH/  EF 60-65%/  GRADE I DIASTOLIC DYSFUNCTION/  MILD LAE     OB History    Gravida  7   Para  7   Term  7   Preterm      AB      Living  4     SAB      TAB      Ectopic      Multiple      Live Births               Home Medications    Prior to Admission medications   Medication Sig Start Date End Date Taking? Authorizing Provider  amLODipine (NORVASC) 5 MG tablet Take 5 mg by mouth daily. 07/03/17   [provider]  aspirin EC 81 MG tablet Take 1 tablet (81 mg total) by mouth daily. 11/03/18   Rhetta Mura, MD  benzonatate (TESSALON) 200 MG capsule Take 1 capsule (200 mg total) by mouth 3 (three) times daily as needed for cough. 01/14/19   Gilda Crease, MD    cephALEXin (KEFLEX) 500 MG capsule Take 1 capsule (500 mg total) by mouth 4 (four) times daily. 02/08/19   Samuel Jester, DO  citalopram (CELEXA) 20 MG tablet Take 1 tablet (20 mg total) by mouth daily. 09/21/18   Oval Linsey, MD  gabapentin (NEURONTIN) 300 MG capsule Take 300 mg by mouth 2 (two) times daily. 09/18/17   [provider]  insulin NPH-regular Human (70-30) 100 UNIT/ML injection Inject 45 Units into the skin 2 (two) times daily. 12/17/18   Sherryll Burger, Pratik D, DO  ipratropium-albuterol (DUONEB) 0.5-2.5 (3) MG/3ML SOLN Inhale 3  mLs into the lungs 4 (four) times daily as needed (for shortness of breath).  09/19/17   [provider]  levothyroxine (SYNTHROID, LEVOTHROID) 25 MCG tablet Take 25 mcg by mouth daily.     [provider]  lisinopril (PRINIVIL,ZESTRIL) 10 MG tablet Take 10 mg by mouth daily.  01/14/19   [provider]  LORazepam (ATIVAN) 1 MG tablet Take 1 tablet (1 mg total) by mouth at bedtime. 09/20/18   Oval Linsey, MD  meclizine (ANTIVERT) 25 MG tablet Take 1 tablet (25 mg total) by mouth 3 (three) times daily as needed for dizziness. 02/08/19   Samuel Jester, DO  metFORMIN (GLUCOPHAGE) 500 MG tablet Take 500 mg by mouth 2 (two) times daily with a meal.      [provider]  nitroGLYCERIN (NITROSTAT) 0.4 MG SL tablet Place 0.4 mg under the tongue every 5 (five) minutes as needed for chest pain.     [provider]  oxyCODONE (OXY IR/ROXICODONE) 5 MG immediate release tablet Take 1 tablet (5 mg total) by mouth every 4 (four) hours as needed (Moderate pain). 06/12/18   Oval Linsey, MD  simvastatin (ZOCOR) 20 MG tablet Take 20 mg by mouth daily. 12/03/18   [provider]  sulfamethoxazole-trimethoprim (BACTRIM DS,SEPTRA DS) 800-160 MG tablet Take 1 tablet by mouth daily.  01/31/19   [provider]    Family History Family History  Problem Relation Age of Onset  . Hypertension Mother   .  Stroke Mother   . Coronary artery disease Other        Multiple first and second-degree relatives  . Aneurysm Other        Cerebral circulation  . Colon cancer Sister   . Stroke Sister   . Coronary artery disease Sister   . Clotting disorder Other        Children diagnosed with hypercoagulable state  . Coronary artery disease Brother     Social History Social History   Tobacco Use  . Smoking status: Never Smoker  . Smokeless tobacco: Never Used  Substance Use Topics  . Alcohol use: No  . Drug use: No     Allergies   Codeine   Review of Systems Review of Systems  Constitutional: Negative for appetite change and fatigue.  HENT: Negative for congestion, ear discharge and sinus pressure.   Eyes: Negative for discharge.  Respiratory: Negative for cough.   Cardiovascular: Negative for chest pain.  Gastrointestinal: Negative for abdominal pain and diarrhea.  Genitourinary: Negative for frequency and hematuria.  Musculoskeletal: Negative for back pain.  Skin: Negative for rash.  Neurological: Positive for weakness. Negative for seizures and headaches.  Psychiatric/Behavioral: Negative for hallucinations.     Physical Exam Updated Vital Signs BP 124/64   Pulse (!) 58   Temp 97.8 F (36.6 C) (Oral)   Resp 14   Ht 5\' 4"  (1.626 m)   Wt 85.7 kg   SpO2 91%   BMI 32.44 kg/m   Physical Exam Vitals signs and nursing note reviewed.  Constitutional:      Appearance: She is well-developed.     Comments: General weakness all over  HENT:     Head: Normocephalic.     Nose: Nose normal.  Eyes:     General: No scleral icterus.    Conjunctiva/sclera: Conjunctivae normal.  Neck:     Musculoskeletal: Neck supple.     Thyroid: No thyromegaly.  Cardiovascular:     Rate and Rhythm: Normal rate and  regular rhythm.     Heart sounds: No murmur. No friction rub. No gallop.   Pulmonary:     Breath sounds: No stridor. No wheezing or rales.  Chest:     Chest wall: No  tenderness.  Abdominal:     General: There is no distension.     Tenderness: There is no abdominal tenderness. There is no rebound.  Musculoskeletal: Normal range of motion.  Lymphadenopathy:     Cervical: No cervical adenopathy.  Skin:    Findings: No erythema or rash.  Neurological:     Mental Status: She is oriented to person, place, and time.     Motor: No abnormal muscle tone.     Coordination: Coordination normal.  Psychiatric:        Behavior: Behavior normal.      ED Treatments / Results  Labs (all labs ordered are listed, but only abnormal results are displayed) Labs Reviewed  CBC WITH DIFFERENTIAL/PLATELET - Abnormal; Notable for the following components:      Result Value   WBC 14.0 (*)    RBC 5.41 (*)    MCH 23.3 (*)    MCHC 27.9 (*)    RDW 17.8 (*)    Neutro Abs 10.8 (*)    Monocytes Absolute 1.1 (*)    All other components within normal limits  COMPREHENSIVE METABOLIC PANEL - Abnormal; Notable for the following components:   Glucose, Bld 64 (*)    Creatinine, Ser 1.15 (*)    AST 14 (*)    GFR calc non Af Amer 45 (*)    GFR calc Af Amer 52 (*)    All other components within normal limits  URINALYSIS, ROUTINE W REFLEX MICROSCOPIC - Abnormal; Notable for the following components:   APPearance CLOUDY (*)    Glucose, UA 150 (*)    Protein, ur 100 (*)    Leukocytes,Ua LARGE (*)    RBC / HPF >50 (*)    WBC, UA >50 (*)    Bacteria, UA RARE (*)    All other components within normal limits  CBG MONITORING, ED - Abnormal; Notable for the following components:   Glucose-Capillary 65 (*)    All other components within normal limits  CBG MONITORING, ED - Abnormal; Notable for the following components:   Glucose-Capillary 120 (*)    All other components within normal limits  URINE CULTURE  TROPONIN I    EKG EKG Interpretation  Date/Time:  Friday February 12 2019 08:10:47 EST Ventricular Rate:  60 PR Interval:    QRS Duration: 114 QT Interval:  422 QTC  Calculation: 422 R Axis:   -119 Text Interpretation:  Sinus rhythm Inferior infarct, old Probable lateral infarct, age indeterminate Anterior infarct, old Baseline wander in lead(s) I II aVR Confirmed by Bethann BerkshireZammit, Enna Warwick [phone]([id number]) on 02/12/2019 11:34:40 AM   Radiology Ct Head Wo Contrast  Result Date: 02/12/2019 CLINICAL DATA:  Ataxia with stroke suspected EXAM: CT HEAD WITHOUT CONTRAST TECHNIQUE: Contiguous axial images were obtained from the base of the skull through the vertex without intravenous contrast. COMPARISON:  02/08/2019 FINDINGS: Brain: No evidence of acute infarction, hemorrhage, hydrocephalus, extra-axial collection or mass lesion/mass effect. Mild for age cerebral volume loss. Chronic small vessel ischemia in the white matter. Vascular: No hyperdense vessel or unexpected calcification. Skull: Normal. Negative for fracture or focal lesion. Sinuses/Orbits: No acute finding. IMPRESSION: Senescent changes without acute finding. Electronically Signed   By: Marnee SpringJonathon  Watts M.D.   On: 02/12/2019 09:02   Dg  Chest Portable 1 View  Result Date: 02/12/2019 CLINICAL DATA:  Status post fall this morning getting out of bed. EXAM: PORTABLE CHEST 1 VIEW COMPARISON:  PA and lateral chest 02/08/2019 and 02/28/2018. FINDINGS: The patient is status post CABG. Heart size is upper normal. Mild discoid atelectasis left lung base noted. Lungs otherwise clear. No pneumothorax or pleural effusion. No acute bony abnormality. There is partial visualization of fixation of a right humerus fracture and rotator cuff repair on the right. IMPRESSION: No acute disease. Electronically Signed   By: Drusilla Kanner M.D.   On: 02/12/2019 08:53    Procedures Procedures (including critical care time)  Medications Ordered in ED Medications  dextrose 50 % solution (has no administration in time range)  dextrose 50 % solution 25 mL (has no administration in time range)  sodium chloride 0.9 % bolus 500 mL (0 mLs Intravenous  Stopped 02/12/19 1125)  oxyCODONE (Oxy IR/ROXICODONE) immediate release tablet 5 mg (5 mg Oral Given 02/12/19 1051)     Initial Impression / Assessment and Plan / ED Course  I have reviewed the triage vital signs and the nursing notes.  Pertinent labs & imaging results that were available during my care of the patient were reviewed by me and considered in my medical decision making (see chart for details).   Patient with general weakness and possible urinary tract infection.  Patient becomes dizzy whenever she sits up.  She will be admitted to medicine       Final Clinical Impressions(s) / ED Diagnoses   Final diagnoses:  Fall, initial encounter    ED Discharge Orders    None       Bethann Berkshire, MD 02/12/19 1349

## 2019-02-12 NOTE — H&P (Signed)
History and Physical  [name] [name] JXB:[id number] DOB: [birth year] DOA: 02/12/2019  Referring physician: Estell Harpin MD  PCP: Oval Linsey, MD   Chief Complaint: weak   HPI: [name] [name] is a 83 y.o. female with a complex past medical history especially detailed below but significant for COPD, hypertension, diabetes mellitus hypothyroidism coronary artery disease and multiple hospitalizations and ED visits over recent years presents to the emergency department after she had been seen several days ago reporting that she fell at home this morning.  She says that she has been generally weak and has difficulty standing.  She feels that her UTI is getting worse.  She was seen several days ago and diagnosed with a UTI and treated with ceftriaxone and discharged home on cephalexin.  She reports that she has been taking the medications as prescribed.  The urine culture did not have any significant bacterial growth.  She denies having chest pain and shortness of breath.  She denies having any significant trauma from the fall.  She apparently laid herself down when she started feeling weak.  She did not hit her head.  She was not down on the ground for prolonged period of time.  She does have poorly controlled diabetes mellitus.  She says that her blood glucose was tested by EMS and she was not hypoglycemic at the time of falling out.  She does have a history of having hypoglycemia from insulin.  She takes 70/30 insulin twice daily.  She also has a history of bradycardia.  She has had multiple syncopal episodes in the past that have been worked up during previous admissions.  She denies abdominal pain and headaches.  She reports that she has been having some dizziness as well.  Her main complaint is weakness in the lower extremities.  No vision changes.  She denies having weakness in the extremities.  The patient on had a an MRI of the brain done several days ago when she was seen in the ED that did not show  any findings of acute CVA.  She had chronic microvascular changes reported from that study.  ED course: The patient had a CT of the brain with no acute findings.  Her urinalysis was positive for yeast and multiple white blood cells noted.  She initially had a blood glucose of 65 that was treated and now improved to 120.  The patient was given a bolus of IV fluids.  Her vital signs temperature 97.8 pulse 65 blood pressure 112/61 and pulse ox 93% on room air.  She had a mild leukocytosis with a white blood cell count of 14.0.  Hemoglobin 12.6.  Platelet count 326.  Her creatinine was 1.15.  Her sodium was 142.  Her chest x-ray was without acute findings.  Review of Systems: All systems reviewed and apart from history of presenting illness, are negative.  Past Medical History:  Diagnosis Date  . Arthritis   . CAD (coronary artery disease)    Multivessel status post CABG 2001  . Chronic obstructive pulmonary disease (HCC)   . Chronic pain   . DDD (degenerative disc disease), lumbar   . Diastolic CHF (HCC)   . Essential hypertension   . Hematuria   . History of CVA (cerebrovascular accident)    2013 - lacunar infarct right thalamus - residual left side of  lip numb  and left eye vision worse (which corrented lens implant from cataract extraction)  . History of MI (myocardial infarction)    July  2001  . Hyperlipidemia   . Hypothyroidism   . Insulin dependent type 2 diabetes mellitus (HCC)   . Lesion of bladder   . Lower urinary tract symptoms (LUTS)   . OSA (obstructive sleep apnea)   . Peripheral neuropathy   . Psoriasis   . SUI (stress urinary incontinence, female)   . Varicose veins    Past Surgical History:  Procedure Laterality Date  . APPENDECTOMY  age 31  . CARDIAC CATHETERIZATION  1991   No sig. cad  . CARDIAC CATHETERIZATION  07-01-2000   high grade in-stent restenosis  . CARDIAC CATHETERIZATION  04-30-2001   single native vessel cad/ graft patent  . CARDIAC CATHETERIZATION   02-13-2006  dr [place] cad with total occluded LAD  and  70% ostial of small ramus/  grafts patent/  normal ef  . CARDIAC CATHETERIZATION  01-20-2000  dr Clifton James   essentially unchanged;  severe single vessel cad with diffuse disease throughout CFX and RCA/ ef 66%;  chf with preserved LVSF  . CARDIOVASCULAR STRESS TEST  08-28-2011   DR ROTHBART   NEGATIVE NUCLEAR STUDY/  GLOBAL LVSF/  EF 65%  . CARPAL TUNNEL RELEASE Bilateral yrs ago  . CATARACT EXTRACTION W/ INTRAOCULAR LENS  IMPLANT, BILATERAL  2013  . CHOLECYSTECTOMY  1990  . COLONOSCOPY N/A 07/26/2015   Procedure: COLONOSCOPY;  Surgeon: Malissa Hippo, MD;  Location: [facility];  Service: Endoscopy;  Laterality: N/A;  1200  . CORONARY ANGIOPLASTY  11-13-1999   balloon angioplasty to mLAD , d2 of LAD  . CORONARY ANGIOPLASTY WITH STENT PLACEMENT  01-17-2000   PCI stenting to mLAD & D2 of LAD  . CORONARY ARTERY BYPASS GRAFT  07-02-2000   DR Tressie Stalker   SVG to diagonal, LIMA to LAD  . CYSTOSCOPY W/ RETROGRADES Bilateral 08/09/2014   Procedure: CYSTOSCOPY, BILATERAL RETROGRADE, HYDRODISTENSION, BLADDER BIOPSY WITH FULGERATION, INSTILL PYRIDIUM AND MARCAINE ;  Surgeon: Anner Crete, MD;  Location: [facility];  Service: Urology;  Laterality: Bilateral;  . KNEE ARTHROSCOPY Left 10-18-2003   SYNOVECTOMY  . LUMBAR SPINE SURGERY  x2    yrs ago  . ORIF HUMERUS FRACTURE Right 04-18-2008  . REPAIR FLEXOR TENDON HAND Right 09-20-2008   CARPI RADIALIS TENDON TRANSFER TO EXTENSOR TO INDEX, MIDDLE, RING , LITTLE FINGERS AND DIGITI MININI  . ROTATOR CUFF REPAIR Right 1995  . TOTAL ABDOMINAL HYSTERECTOMY W/ BILATERAL SALPINGOOPHORECTOMY  age 32  . TRANSTHORACIC ECHOCARDIOGRAM  12-12-2013   MILD LVH/  EF 60-65%/  GRADE I DIASTOLIC DYSFUNCTION/  MILD LAE   Social History:  reports that she has never smoked. She has never used smokeless tobacco. She reports that she does not drink alcohol or use drugs.  Allergies  Allergen  Reactions  . Codeine Itching    Family History  Problem Relation Age of Onset  . Hypertension Mother   . Stroke Mother   . Coronary artery disease Other        Multiple first and second-degree relatives  . Aneurysm Other        Cerebral circulation  . Colon cancer Sister   . Stroke Sister   . Coronary artery disease Sister   . Clotting disorder Other        Children diagnosed with hypercoagulable state  . Coronary artery disease Brother     Prior to Admission medications   Medication Sig Start Date End Date Taking? Authorizing Provider  amLODipine (NORVASC) 5 MG tablet Take  5 mg by mouth daily. 07/03/17   [provider]  aspirin EC 81 MG tablet Take 1 tablet (81 mg total) by mouth daily. 11/03/18   Rhetta MuraSamtani, Jai-Gurmukh, MD  benzonatate (TESSALON) 200 MG capsule Take 1 capsule (200 mg total) by mouth 3 (three) times daily as needed for cough. 01/14/19   Gilda CreasePollina, Christopher J, MD  cephALEXin (KEFLEX) 500 MG capsule Take 1 capsule (500 mg total) by mouth 4 (four) times daily. 02/08/19   Samuel JesterMcManus, Kathleen, DO  citalopram (CELEXA) 20 MG tablet Take 1 tablet (20 mg total) by mouth daily. 09/21/18   Oval Linseyondiego, Richard, MD  gabapentin (NEURONTIN) 300 MG capsule Take 300 mg by mouth 2 (two) times daily. 09/18/17   [provider]  insulin NPH-regular Human (70-30) 100 UNIT/ML injection Inject 45 Units into the skin 2 (two) times daily. 12/17/18   Sherryll BurgerShah, Pratik D, DO  ipratropium-albuterol (DUONEB) 0.5-2.5 (3) MG/3ML SOLN Inhale 3 mLs into the lungs 4 (four) times daily as needed (for shortness of breath).  09/19/17   [provider]  levothyroxine (SYNTHROID, LEVOTHROID) 25 MCG tablet Take 25 mcg by mouth daily.     [provider]  lisinopril (PRINIVIL,ZESTRIL) 10 MG tablet Take 10 mg by mouth daily.  01/14/19   [provider]  LORazepam (ATIVAN) 1 MG tablet Take 1 tablet (1 mg total) by mouth at bedtime. 09/20/18   Oval Linseyondiego, Richard, MD  meclizine  (ANTIVERT) 25 MG tablet Take 1 tablet (25 mg total) by mouth 3 (three) times daily as needed for dizziness. 02/08/19   Samuel JesterMcManus, Kathleen, DO  metFORMIN (GLUCOPHAGE) 500 MG tablet Take 500 mg by mouth 2 (two) times daily with a meal.      [provider]  nitroGLYCERIN (NITROSTAT) 0.4 MG SL tablet Place 0.4 mg under the tongue every 5 (five) minutes as needed for chest pain.     [provider]  oxyCODONE (OXY IR/ROXICODONE) 5 MG immediate release tablet Take 1 tablet (5 mg total) by mouth every 4 (four) hours as needed (Moderate pain). 06/12/18   Oval Linseyondiego, Richard, MD  simvastatin (ZOCOR) 20 MG tablet Take 20 mg by mouth daily. 12/03/18   [provider]  sulfamethoxazole-trimethoprim (BACTRIM DS,SEPTRA DS) 800-160 MG tablet Take 1 tablet by mouth daily.  01/31/19   [provider]   Physical Exam: Vitals:   02/12/19 1230 02/12/19 1300 02/12/19 1330 02/12/19 1400  BP: 130/60 118/74 112/61 102/67  Pulse: 90 (!) 140    Resp: 15 17 15 16   Temp:      TempSrc:      SpO2: 92% 93%    Weight:      Height:         General exam: Moderately built and nourished patient, lying comfortably supine on the gurney in no obvious distress.  Head, eyes and ENT: Nontraumatic and normocephalic. Pupils equally reacting to light and accommodation. Oral mucosa dry.  Neck: Supple. No JVD, carotid bruit or thyromegaly.  Lymphatics: No lymphadenopathy.  Respiratory system: Clear to auscultation. No increased work of breathing.  Cardiovascular system: S1 and S2 heard, RRR. No JVD, murmurs, gallops, clicks or pedal edema.  Gastrointestinal system: Abdomen is nondistended, soft and nontender. Normal bowel sounds heard. No organomegaly or masses appreciated.  Central nervous system: Alert and oriented. No focal neurological deficits.  Extremities: Symmetric 5 x 5 power. Peripheral pulses symmetrically felt.   Skin: No rashes or acute findings.  Musculoskeletal system: Negative  exam.  Psychiatry: Pleasant and cooperative.  Labs on Admission:  Basic Metabolic Panel: Recent Labs  Lab 02/08/19 1740 02/12/19 0859  NA 140 142  K 4.5 4.2  CL 107 108  CO2 25 25  GLUCOSE 265* 64*  BUN 14 20  CREATININE 0.72 1.15*  CALCIUM 9.5 9.4   Liver Function Tests: Recent Labs  Lab 02/08/19 1740 02/12/19 0859  AST 15 14*  ALT 12 13  ALKPHOS 51 60  BILITOT 0.4 0.3  PROT 6.9 6.7  ALBUMIN 3.9 3.7   No results for input(s): LIPASE, AMYLASE in the last 168 hours. No results for input(s): AMMONIA in the last 168 hours. CBC: Recent Labs  Lab 02/08/19 1740 02/12/19 0859  WBC 12.0* 14.0*  NEUTROABS 8.0* 10.8*  HGB 12.5 12.6  HCT 43.8 45.2  MCV 81.9 83.5  PLT 300 326   Cardiac Enzymes: Recent Labs  Lab 02/08/19 1740 02/12/19 0859  TROPONINI <0.03 <0.03    BNP (last 3 results) No results for input(s): PROBNP in the last 8760 hours. CBG: Recent Labs  Lab 02/08/19 1706 02/12/19 1146 02/12/19 1228  GLUCAP 261* 65* 120*    Radiological Exams on Admission: Ct Head Wo Contrast  Result Date: 02/12/2019 CLINICAL DATA:  Ataxia with stroke suspected EXAM: CT HEAD WITHOUT CONTRAST TECHNIQUE: Contiguous axial images were obtained from the base of the skull through the vertex without intravenous contrast. COMPARISON:  02/08/2019 FINDINGS: Brain: No evidence of acute infarction, hemorrhage, hydrocephalus, extra-axial collection or mass lesion/mass effect. Mild for age cerebral volume loss. Chronic small vessel ischemia in the white matter. Vascular: No hyperdense vessel or unexpected calcification. Skull: Normal. Negative for fracture or focal lesion. Sinuses/Orbits: No acute finding. IMPRESSION: Senescent changes without acute finding. Electronically Signed   By: Marnee Spring M.D.   On: 02/12/2019 09:02   Dg Chest Portable 1 View  Result Date: 02/12/2019 CLINICAL DATA:  Status post fall this morning getting out of bed. EXAM: PORTABLE CHEST 1 VIEW COMPARISON:   PA and lateral chest 02/08/2019 and 02/28/2018. FINDINGS: The patient is status post CABG. Heart size is upper normal. Mild discoid atelectasis left lung base noted. Lungs otherwise clear. No pneumothorax or pleural effusion. No acute bony abnormality. There is partial visualization of fixation of a right humerus fracture and rotator cuff repair on the right. IMPRESSION: No acute disease. Electronically Signed   By: Drusilla Kanner M.D.   On: 02/12/2019 08:53    EKG: Independently reviewed.  Normal sinus rhythm  Assessment/Plan Active Problems:   Hypothyroidism   DM type 2 causing complication (HCC)   Hyperlipidemia   Obesity   Essential hypertension   Arteriosclerotic cardiovascular disease (ASCVD)   Sleep apnea   Chronic obstructive pulmonary disease (HCC)   Noncompliance   Noncompliance with CPAP treatment   Gait instability   Anemia of chronic disease   Hyperglycemia   Generalized weakness   Fall at home   Symptomatic Yeast UTI   Leukocytosis   AKI (acute kidney injury) (HCC)  1. Acute generalized weakness-patient claims that she is not able to stand and walk because of weakness.  She is mildly dehydrated and we will treat her with IV fluids.  I think that she does have a symptomatic UTI.  That will be treated.  Obtain orthostatic vitals.  Continue supportive therapy and monitor on telemetry.  PT evaluation requested. 2. Type 2 diabetes mellitus, insulin requiring with neurological complications- monitor blood glucose closely as she has had some hypoglycemia.  Monitor blood glucose 5 times per day.  Sliding  scale coverage ordered.  Awaiting pharmacy to reconcile home medications. 3. Leukocytosis- suspect secondary to yeast UTI, check blood cultures for candidemia. 4. Yeast UTI-patient is symptomatic from this and we will treat.  Fluconazole oral tablets x14 days ordered. 5. AKI- likely prerenal and treating with hydration. 6. Fall at home-fall precautions recommended and PT  evaluation requested. 7. Hypothyroidism- add TSH to blood in lab. 8. Anemia of chronic disease- we will monitor CBC carefully. 9. Chronic gait instability- fall precautions and PT evaluation requested. 10. OSA-will offer nightly CPAP. 11. Essential hypertension- when medications have been reconciled will review and resume as appropriate. 12. Chronic pain and opioid dependence - resume home meds as appropriate when reconciled.  Patient had a negative urine drug screen on 02/08/2019.  DVT Prophylaxis: Lovenox Code Status: Full Family Communication: Patient updated at bedside Disposition Plan: Observation  Time spent: 56 minutes  [name] Ury Laural Benes, MD [facility] How to contact the [facility] Attending or Consulting provider 7A - 7P or covering provider during after hours 7P -7A, for this patient?  1. Check the care team in [facility] and look for a) attending/consulting [facility] provider listed and b) the [facility] team listed 2. Log into [email or web address] and use [place]'s universal password to access. If you do not have the password, please contact the hospital operator. 3. Locate the [facility] provider you are looking for under [facility] and page to a number that you can be directly reached. 4. If you still have difficulty reaching the provider, please page the [facility] (Director on Call) for the Hospitalists listed on amion for assistance.

## 2019-02-12 NOTE — Progress Notes (Signed)
Pt arrived to floor. Pt is alert and oriented x4 Pt reports she was at home and got up out of the bed and "my legs gave out". Pt states she is weak at home but this is new. Pt states her left leg and arm are weaker "for a long time, probably since my stroke". Pt given a purewick catheter at her request due to her "dribbling". Pt requesting water and the remote. Pt educated that she will be provided all the things she needs once she is assessed and her telemetry is placed. Pt verbalizes understanding.

## 2019-02-12 NOTE — Progress Notes (Signed)
Patient placed on CPAP 6.5 nasal mask. Patient does not wear CPAP at home. Basically tried to get her not use, but patient stated she thought she needed to wear. Set her up basic settings as she does not know what she wears at home. Sleep study not available. She does also seem to confuse machine with breathing treatments.

## 2019-02-12 NOTE — ED Notes (Signed)
Hospitalist came to beside to see pt, before going to the floor.

## 2019-02-13 DIAGNOSIS — I5032 Chronic diastolic (congestive) heart failure: Secondary | ICD-10-CM | POA: Diagnosis present

## 2019-02-13 DIAGNOSIS — E118 Type 2 diabetes mellitus with unspecified complications: Secondary | ICD-10-CM | POA: Diagnosis not present

## 2019-02-13 DIAGNOSIS — R531 Weakness: Secondary | ICD-10-CM | POA: Diagnosis present

## 2019-02-13 DIAGNOSIS — D638 Anemia in other chronic diseases classified elsewhere: Secondary | ICD-10-CM | POA: Diagnosis not present

## 2019-02-13 DIAGNOSIS — B3749 Other urogenital candidiasis: Secondary | ICD-10-CM | POA: Diagnosis present

## 2019-02-13 DIAGNOSIS — Z955 Presence of coronary angioplasty implant and graft: Secondary | ICD-10-CM | POA: Diagnosis not present

## 2019-02-13 DIAGNOSIS — Z885 Allergy status to narcotic agent status: Secondary | ICD-10-CM | POA: Diagnosis not present

## 2019-02-13 DIAGNOSIS — Z951 Presence of aortocoronary bypass graft: Secondary | ICD-10-CM | POA: Diagnosis not present

## 2019-02-13 DIAGNOSIS — L409 Psoriasis, unspecified: Secondary | ICD-10-CM | POA: Diagnosis present

## 2019-02-13 DIAGNOSIS — G8929 Other chronic pain: Secondary | ICD-10-CM | POA: Diagnosis not present

## 2019-02-13 DIAGNOSIS — I2510 Atherosclerotic heart disease of native coronary artery without angina pectoris: Secondary | ICD-10-CM | POA: Diagnosis present

## 2019-02-13 DIAGNOSIS — N390 Urinary tract infection, site not specified: Secondary | ICD-10-CM | POA: Diagnosis not present

## 2019-02-13 DIAGNOSIS — M5440 Lumbago with sciatica, unspecified side: Secondary | ICD-10-CM | POA: Diagnosis not present

## 2019-02-13 DIAGNOSIS — E785 Hyperlipidemia, unspecified: Secondary | ICD-10-CM | POA: Diagnosis present

## 2019-02-13 DIAGNOSIS — M5136 Other intervertebral disc degeneration, lumbar region: Secondary | ICD-10-CM | POA: Diagnosis present

## 2019-02-13 DIAGNOSIS — E1144 Type 2 diabetes mellitus with diabetic amyotrophy: Secondary | ICD-10-CM | POA: Diagnosis present

## 2019-02-13 DIAGNOSIS — Z79890 Hormone replacement therapy (postmenopausal): Secondary | ICD-10-CM | POA: Diagnosis not present

## 2019-02-13 DIAGNOSIS — E039 Hypothyroidism, unspecified: Secondary | ICD-10-CM | POA: Diagnosis present

## 2019-02-13 DIAGNOSIS — I10 Essential (primary) hypertension: Secondary | ICD-10-CM | POA: Diagnosis not present

## 2019-02-13 DIAGNOSIS — Z8249 Family history of ischemic heart disease and other diseases of the circulatory system: Secondary | ICD-10-CM | POA: Diagnosis not present

## 2019-02-13 DIAGNOSIS — G4733 Obstructive sleep apnea (adult) (pediatric): Secondary | ICD-10-CM | POA: Diagnosis present

## 2019-02-13 DIAGNOSIS — I25708 Atherosclerosis of coronary artery bypass graft(s), unspecified, with other forms of angina pectoris: Secondary | ICD-10-CM | POA: Diagnosis not present

## 2019-02-13 DIAGNOSIS — R5381 Other malaise: Secondary | ICD-10-CM | POA: Diagnosis not present

## 2019-02-13 DIAGNOSIS — N179 Acute kidney failure, unspecified: Secondary | ICD-10-CM | POA: Diagnosis present

## 2019-02-13 DIAGNOSIS — E1142 Type 2 diabetes mellitus with diabetic polyneuropathy: Secondary | ICD-10-CM | POA: Diagnosis present

## 2019-02-13 DIAGNOSIS — Z823 Family history of stroke: Secondary | ICD-10-CM | POA: Diagnosis not present

## 2019-02-13 DIAGNOSIS — Z79899 Other long term (current) drug therapy: Secondary | ICD-10-CM | POA: Diagnosis not present

## 2019-02-13 DIAGNOSIS — M549 Dorsalgia, unspecified: Secondary | ICD-10-CM | POA: Diagnosis not present

## 2019-02-13 DIAGNOSIS — Z7982 Long term (current) use of aspirin: Secondary | ICD-10-CM | POA: Diagnosis not present

## 2019-02-13 DIAGNOSIS — J449 Chronic obstructive pulmonary disease, unspecified: Secondary | ICD-10-CM | POA: Diagnosis present

## 2019-02-13 DIAGNOSIS — E11649 Type 2 diabetes mellitus with hypoglycemia without coma: Secondary | ICD-10-CM | POA: Diagnosis not present

## 2019-02-13 DIAGNOSIS — Z800 Family history of malignant neoplasm of digestive organs: Secondary | ICD-10-CM | POA: Diagnosis not present

## 2019-02-13 DIAGNOSIS — Z794 Long term (current) use of insulin: Secondary | ICD-10-CM | POA: Diagnosis not present

## 2019-02-13 DIAGNOSIS — J410 Simple chronic bronchitis: Secondary | ICD-10-CM | POA: Diagnosis not present

## 2019-02-13 DIAGNOSIS — I110 Hypertensive heart disease with heart failure: Secondary | ICD-10-CM | POA: Diagnosis present

## 2019-02-13 DIAGNOSIS — E162 Hypoglycemia, unspecified: Secondary | ICD-10-CM | POA: Diagnosis not present

## 2019-02-13 DIAGNOSIS — F1120 Opioid dependence, uncomplicated: Secondary | ICD-10-CM | POA: Diagnosis present

## 2019-02-13 DIAGNOSIS — E860 Dehydration: Secondary | ICD-10-CM | POA: Diagnosis present

## 2019-02-13 LAB — CBC WITH DIFFERENTIAL/PLATELET
Abs Immature Granulocytes: 0.04 10*3/uL (ref 0.00–0.07)
BASOS PCT: 0 %
Basophils Absolute: 0 10*3/uL (ref 0.0–0.1)
Eosinophils Absolute: 0.2 10*3/uL (ref 0.0–0.5)
Eosinophils Relative: 2 %
HCT: 39.9 % (ref 36.0–46.0)
Hemoglobin: 11.5 g/dL — ABNORMAL LOW (ref 12.0–15.0)
Immature Granulocytes: 0 %
Lymphocytes Relative: 20 %
Lymphs Abs: 2.3 10*3/uL (ref 0.7–4.0)
MCH: 23.5 pg — AB (ref 26.0–34.0)
MCHC: 28.8 g/dL — ABNORMAL LOW (ref 30.0–36.0)
MCV: 81.6 fL (ref 80.0–100.0)
MONO ABS: 1 10*3/uL (ref 0.1–1.0)
Monocytes Relative: 9 %
Neutro Abs: 7.8 10*3/uL — ABNORMAL HIGH (ref 1.7–7.7)
Neutrophils Relative %: 69 %
Platelets: 276 10*3/uL (ref 150–400)
RBC: 4.89 MIL/uL (ref 3.87–5.11)
RDW: 17.2 % — ABNORMAL HIGH (ref 11.5–15.5)
WBC: 11.4 10*3/uL — AB (ref 4.0–10.5)
nRBC: 0 % (ref 0.0–0.2)

## 2019-02-13 LAB — GLUCOSE, CAPILLARY
Glucose-Capillary: 236 mg/dL — ABNORMAL HIGH (ref 70–99)
Glucose-Capillary: 191 mg/dL — ABNORMAL HIGH (ref 70–99)
Glucose-Capillary: 204 mg/dL — ABNORMAL HIGH (ref 70–99)
Glucose-Capillary: 256 mg/dL — ABNORMAL HIGH (ref 70–99)
Glucose-Capillary: 157 mg/dL — ABNORMAL HIGH (ref 70–99)

## 2019-02-13 LAB — COMPREHENSIVE METABOLIC PANEL
ALT: 11 U/L (ref 0–44)
AST: 12 U/L — ABNORMAL LOW (ref 15–41)
Albumin: 3.2 g/dL — ABNORMAL LOW (ref 3.5–5.0)
Alkaline Phosphatase: 58 U/L (ref 38–126)
Anion gap: 8 (ref 5–15)
BUN: 12 mg/dL (ref 8–23)
CO2: 25 mmol/L (ref 22–32)
Calcium: 8.9 mg/dL (ref 8.9–10.3)
Chloride: 104 mmol/L (ref 98–111)
Creatinine, Ser: 0.72 mg/dL (ref 0.44–1.00)
Glucose, Bld: 211 mg/dL — ABNORMAL HIGH (ref 70–99)
Potassium: 4.2 mmol/L (ref 3.5–5.1)
Sodium: 137 mmol/L (ref 135–145)
Total Bilirubin: 0.6 mg/dL (ref 0.3–1.2)
Total Protein: 6 g/dL — ABNORMAL LOW (ref 6.5–8.1)

## 2019-02-13 LAB — HEMOGLOBIN A1C
Hgb A1c MFr Bld: 8.7 % — ABNORMAL HIGH (ref 4.8–5.6)
Mean Plasma Glucose: 202.99 mg/dL

## 2019-02-13 LAB — MAGNESIUM: Magnesium: 1.8 mg/dL (ref 1.7–2.4)

## 2019-02-13 LAB — VITAMIN D 25 HYDROXY (VIT D DEFICIENCY, FRACTURES): Vit D, 25-Hydroxy: 26.2 ng/mL — ABNORMAL LOW (ref 30.0–100.0)

## 2019-02-13 MED ORDER — PHENAZOPYRIDINE HCL 100 MG PO TABS
200.0000 mg | ORAL_TABLET | Freq: Three times a day (TID) | ORAL | Status: AC
  Administered 2019-02-13 – 2019-02-15 (×6): 200 mg via ORAL
  Filled 2019-02-13 (×6): qty 2

## 2019-02-13 MED ORDER — VITAMIN D (ERGOCALCIFEROL) 1.25 MG (50000 UNIT) PO CAPS
50000.0000 [IU] | ORAL_CAPSULE | Freq: Once | ORAL | Status: AC
  Administered 2019-02-13: 50000 [IU] via ORAL
  Filled 2019-02-13: qty 1

## 2019-02-13 NOTE — Progress Notes (Signed)
Patient pulled mask off asleep without CPAP

## 2019-02-13 NOTE — Plan of Care (Signed)
  Problem: Acute Rehab PT Goals(only PT should resolve) Goal: Pt Will Go Supine/Side To Sit Outcome: Progressing Flowsheets (Taken 02/13/2019 1152) Pt will go Supine/Side to Sit: with min guard assist Goal: Patient Will Transfer Sit To/From Stand Outcome: Progressing Flowsheets (Taken 02/13/2019 1152) Patient will transfer sit to/from stand: with min guard assist Goal: Pt Will Transfer Bed To Chair/Chair To Bed Outcome: Progressing Flowsheets (Taken 02/13/2019 1152) Pt will Transfer Bed to Chair/Chair to Bed: min guard assist Goal: Pt Will Ambulate Outcome: Progressing Flowsheets (Taken 02/13/2019 1152) Pt will Ambulate: 75 feet; with min guard assist; with rolling walker   11:53 AM, 02/13/19 Ocie Bob, MPT Physical Therapist with [facility] [phone] office [phone] mobile phone

## 2019-02-13 NOTE — Progress Notes (Signed)
PROGRESS NOTE  [name] [name]  ZOX:[id number]  DOB: 1936/11/24  DOA: 02/12/2019 PCP: Oval Linseyondiego, Richard, MD   Brief Admission Hx: 83 y.o. female with a complex past medical history especially detailed below but significant for COPD, hypertension, diabetes mellitus hypothyroidism coronary artery disease and multiple hospitalizations and ED visits over recent years presents to the emergency department after she had been seen several days ago reporting that she fell at home this morning.  MDM/Assessment & Plan:    1. Acute generalized weakness-patient claims that she is not able to stand and walk because of weakness.  She is mildly dehydrated and we will treat her with IV fluids.   2. Type 2 diabetes mellitus, insulin requiring with neurological complications- monitor blood glucose closely as she has had some hypoglycemia.  Monitor blood glucose 5 times per day.  Sliding scale coverage ordered.  3. Leukocytosis- suspect secondary to yeast UTI, check blood cultures for candidemia. WBC trending down. 4. Symptomatic Yeast UTI-patient is symptomatic from this and we will treat.  Fluconazole oral tablets x14 days ordered.  Pyridium ordered for dysuria.  Recommend urology follow up outpatient.  5. AKI-RESOLVED likely prerenal and treating with hydration. 6. Fall at home-fall precautions recommended and PT recommending SNF. 7. Hypothyroidism- TSH stable.  8. Anemia of chronic disease- we will monitor CBC carefully. 9. Chronic gait instability- fall precautions and PT evaluation requested. 10. OSA-will offer nightly CPAP. 11. Essential hypertension- stable BP.  12. Chronic pain and opioid dependence - resumed home meds.  Patient had a negative urine drug screen on 02/08/2019.  DVT Prophylaxis: Lovenox Code Status: Full Family Communication: Patient updated at bedside Disposition Plan: INP for IV fluids needed, awaiting SNF  placement  Consultants:    Procedures:    Antimicrobials:  Fluconazole 2/21 >>   Subjective: Pt continues to have dysuria and feels weak.  She wants to go home but would be alone and no one available to care for her 24/7.    Objective: Vitals:   02/12/19 1447 02/12/19 2126 02/12/19 2301 02/13/19 0556  BP: (!) 141/71 134/67  132/65  Pulse: 65 77 74 76  Resp: 16 16 16 18   Temp: 98.2 F (36.8 C) 98.7 F (37.1 C)  98.5 F (36.9 C)  TempSrc: Oral Oral  Oral  SpO2: 98% 97% 93% 98%  Weight: 90.3 kg     Height: 5\' 4"  (1.626 m)       Intake/Output Summary (Last 24 hours) at 02/13/2019 1307 Last data filed at 02/13/2019 1100 Gross per 24 hour  Intake 283.02 ml  Output 1050 ml  Net -766.98 ml   Filed Weights   02/12/19 0811 02/12/19 1447  Weight: 85.7 kg 90.3 kg   REVIEW OF SYSTEMS  As per history otherwise all reviewed and reported negative  Exam:  General exam: awake, alert, NAD, cooperative.  Respiratory system: BBS CTA.  No increased work of breathing. Cardiovascular system: normal S1 & S2 heard. No JVD pedal edema. Gastrointestinal system: Abdomen is nondistended, soft and nontender. Normal bowel sounds heard. Central nervous system: Alert and oriented. No focal neurological deficits. Extremities: no cyanosis or clubbing.  Data Reviewed: Basic Metabolic Panel: Recent Labs  Lab 02/08/19 1740 02/12/19 0859 02/13/19 0645  NA 140 142 137  K 4.5 4.2 4.2  CL 107 108 104  CO2 25 25 25   GLUCOSE 265* 64* 211*  BUN 14 20 12   CREATININE 0.72 1.15* 0.72  CALCIUM 9.5 9.4 8.9  MG  --   --  1.8   Liver Function Tests: Recent Labs  Lab 02/08/19 1740 02/12/19 0859 02/13/19 0645  AST 15 14* 12*  ALT 12 13 11   ALKPHOS 51 60 58  BILITOT 0.4 0.3 0.6  PROT 6.9 6.7 6.0*  ALBUMIN 3.9 3.7 3.2*   No results for input(s): LIPASE, AMYLASE in the last 168 hours. No results for input(s): AMMONIA in the last 168 hours. CBC: Recent Labs  Lab 02/08/19 1740  02/12/19 0859 02/13/19 0645  WBC 12.0* 14.0* 11.4*  NEUTROABS 8.0* 10.8* 7.8*  HGB 12.5 12.6 11.5*  HCT 43.8 45.2 39.9  MCV 81.9 83.5 81.6  PLT 300 326 276   Cardiac Enzymes: Recent Labs  Lab 02/08/19 1740 02/12/19 0859  TROPONINI <0.03 <0.03   CBG (last 3)  Recent Labs    02/13/19 0258 02/13/19 0805 02/13/19 1220  GLUCAP 236* 191* 204*   Recent Results (from the past 240 hour(s))  Urine culture     Status: Abnormal   Collection Time: 02/08/19  5:39 PM  Result Value Ref Range Status   Specimen Description   Final    URINE, CLEAN CATCH Performed at [facility], [address] [place], [place] [address]    Special Requests   Final    NONE Performed at [facility], [address], [place], [place] [address]    Culture (A)  Final    <10,000 COLONIES/mL INSIGNIFICANT GROWTH Performed at [facility], [address], [place], [place] [address]    Report Status 02/10/2019 FINAL  Final  Culture, blood (Routine X 2) w Reflex to ID Panel     Status: None (Preliminary result)   Collection Time: 02/12/19  3:02 PM  Result Value Ref Range Status   Specimen Description BLOOD LEFT ANTECUBITAL  Final   Special Requests   Final    BOTTLES DRAWN AEROBIC AND ANAEROBIC Blood Culture adequate volume   Culture   Final    NO GROWTH < 24 HOURS Performed at [facility], [address], [place], [place] [address]    Report Status PENDING  Incomplete  Culture, blood (Routine X 2) w Reflex to ID Panel     Status: None (Preliminary result)   Collection Time: 02/12/19  3:03 PM  Result Value Ref Range Status   Specimen Description BLOOD BLOOD LEFT HAND  Final   Special Requests   Final    BOTTLES DRAWN AEROBIC AND ANAEROBIC Blood Culture adequate volume   Culture   Final    NO GROWTH < 24 HOURS Performed at [name], [address], [place], [place] [address]    Report Status PENDING  Incomplete     Studies: Ct Head Wo Contrast  Result Date: 02/12/2019 CLINICAL DATA:   Ataxia with stroke suspected EXAM: CT HEAD WITHOUT CONTRAST TECHNIQUE: Contiguous axial images were obtained from the base of the skull through the vertex without intravenous contrast. COMPARISON:  02/08/2019 FINDINGS: Brain: No evidence of acute infarction, hemorrhage, hydrocephalus, extra-axial collection or mass lesion/mass effect. Mild for age cerebral volume loss. Chronic small vessel ischemia in the white matter. Vascular: No hyperdense vessel or unexpected calcification. Skull: Normal. Negative for fracture or focal lesion. Sinuses/Orbits: No acute finding. IMPRESSION: Senescent changes without acute finding. Electronically Signed   By: Marnee Spring M.D.   On: 02/12/2019 09:02   Dg Chest Portable 1 View  Result Date: 02/12/2019 CLINICAL DATA:  Status post fall this morning getting out of bed. EXAM: PORTABLE CHEST 1 VIEW COMPARISON:  PA and  lateral chest 02/08/2019 and 02/28/2018. FINDINGS: The patient is status post CABG. Heart size is upper normal. Mild discoid atelectasis left lung base noted. Lungs otherwise clear. No pneumothorax or pleural effusion. No acute bony abnormality. There is partial visualization of fixation of a right humerus fracture and rotator cuff repair on the right. IMPRESSION: No acute disease. Electronically Signed   By: Drusilla Kanner M.D.   On: 02/12/2019 08:53   Scheduled Meds: . amLODipine  5 mg Oral Daily  . aspirin EC  81 mg Oral Daily  . citalopram  20 mg Oral Daily  . dextrose  25 mL Intravenous Once  . enoxaparin (LOVENOX) injection  40 mg Subcutaneous Q24H  . fluconazole  200 mg Oral Daily  . gabapentin  300 mg Oral BID  . insulin aspart  0-9 Units Subcutaneous TID WC  . insulin aspart protamine- aspart  30 Units Subcutaneous BID WC  . levothyroxine  25 mcg Oral QAC breakfast  . LORazepam  1 mg Oral QHS  . pantoprazole  40 mg Oral Q0600  . simvastatin  20 mg Oral q1800   Continuous Infusions: . sodium chloride 60 mL/hr at 02/12/19 1846    Active  Problems:   Hypothyroidism   DM type 2 causing complication (HCC)   Hyperlipidemia   Obesity   Essential hypertension   Arteriosclerotic cardiovascular disease (ASCVD)   Sleep apnea   Chronic obstructive pulmonary disease (HCC)   Noncompliance   Noncompliance with CPAP treatment   Gait instability   Anemia of chronic disease   Hyperglycemia   Generalized weakness   Fall at home   Symptomatic Yeast UTI   Leukocytosis   AKI (acute kidney injury) (HCC)  Time spent:   Standley Dakins, MD [facility] 02/13/2019, 1:07 PM    LOS: 0 days  How to contact the [facility] Attending or Consulting provider 7A - 7P or covering provider during after hours 7P -7A, for this patient?  1. Check the care team in [facility] and look for a) attending/consulting [facility] provider listed and b) the [facility] team listed 2. Log into [email or web address] and use [facility]'s universal password to access. If you do not have the password, please contact the hospital operator. 3. Locate the [facility] provider you are looking for under [facility] and page to a number that you can be directly reached. 4. If you still have difficulty reaching the provider, please page the [facility] (Director on Call) for the Hospitalists listed on amion for assistance.

## 2019-02-13 NOTE — Evaluation (Signed)
Physical Therapy Evaluation Patient Details Name: [name] [name] MRN: [id number] DOB: [birth year] Today's Date: 02/13/2019   History of Present Illness  [name] [name] is a 83 y.o. female with a complex past medical history especially detailed below but significant for COPD, hypertension, diabetes mellitus hypothyroidism coronary artery disease and multiple hospitalizations and ED visits over recent years presents to the emergency department after she had been seen several days ago reporting that she fell at home this morning.  She says that she has been generally weak and has difficulty standing.  She feels that her UTI is getting worse.  She was seen several days ago and diagnosed with a UTI and treated with ceftriaxone and discharged home on cephalexin.  She reports that she has been taking the medications as prescribed.  The urine culture did not have any significant bacterial growth.  She denies having chest pain and shortness of breath.  She denies having any significant trauma from the fall.  She apparently laid herself down when she started feeling weak.  She did not hit her head.  She was not down on the ground for prolonged period of time.  She does have poorly controlled diabetes mellitus.  She says that her blood glucose was tested by EMS and she was not hypoglycemic at the time of falling out.  She does have a history of having hypoglycemia from insulin.  She takes 70/30 insulin twice daily.  She also has a history of bradycardia.  She has had multiple syncopal episodes in the past that have been worked up during previous admissions.  She denies abdominal pain and headaches.  She reports that she has been having some dizziness as well.  Her main complaint is weakness in the lower extremities.  No vision changes.  She denies having weakness in the extremities.  The patient on had a an MRI of the brain done several days ago when she was seen in the ED that did not show any findings of acute CVA.   She had chronic microvascular changes reported from that study.    Clinical Impression  Patient at risk for falls due to unsteadiness on feet with difficulty making turns using RW due to buckling/weakness LLE, had one near fall and limited for ambulation due to c/o fatigue.  Patient tolerated sitting up in chair after therapy.  Patient will benefit from continued physical therapy in hospital and recommended venue below to increase strength, balance, endurance for safe ADLs and gait.    Follow Up Recommendations SNF    Equipment Recommendations  None recommended by PT    Recommendations for Other Services       Precautions / Restrictions Precautions Precautions: Fall Restrictions Weight Bearing Restrictions: No      Mobility  Bed Mobility Overal bed mobility: Needs Assistance Bed Mobility: Rolling;Sidelying to Sit Rolling: Min assist Sidelying to sit: Mod assist       General bed mobility comments: slow labored movement   Transfers Overall transfer level: Needs assistance Equipment used: Rolling walker (2 wheeled) Transfers: Sit to/from [employer] Sit to Stand: Min assist Stand pivot transfers: Min assist       General transfer comment: unsteady on feet especially LLE due to weakness  Ambulation/Gait Ambulation/Gait assistance: Min assist Gait Distance (Feet): 35 Feet Assistive device: Rolling walker (2 wheeled) Gait Pattern/deviations: Decreased step length - right;Decreased step length - left;Decreased stride length Gait velocity: decreased   General Gait Details: slow slightly labored cadence with difficulty making turns  due to LLE buckling with near fall prevent by therapist  Stairs            Wheelchair Mobility    Modified Rankin (Stroke Patients Only)       Balance Overall balance assessment: Needs assistance Sitting-balance support: Feet supported Sitting balance-Leahy Scale: Good     Standing balance support: Bilateral  upper extremity supported;During functional activity Standing balance-Leahy Scale: Fair Standing balance comment: using RW                             Pertinent Vitals/Pain Pain Assessment: 0-10 Pain Score: 8  Pain Location: chronic low back Pain Descriptors / Indicators: Sore;Discomfort Pain Intervention(s): Limited activity within patient's tolerance;Monitored during session;Repositioned    Home Living Family/patient expects to be discharged to:: Private residence Living Arrangements: Spouse/significant other;Children Available Help at Discharge: Family Type of Home: House Home Access: Stairs to enter Entrance Stairs-Rails: Right;Left;Can reach both Secretary/administrator of Steps: 2 Home Layout: One level Home Equipment: Shower seat;Cane - single point;Walker - 2 wheels;Bedside commode Additional Comments: presently patient's spouse at SNF, her son admitted in hospital and daughter on disability    Prior Function Level of Independence: Independent with assistive device(s)         Comments: community ambulator with SPC, drives     Hand Dominance   Dominant Hand: Right    Extremity/Trunk Assessment   Upper Extremity Assessment Upper Extremity Assessment: Generalized weakness    Lower Extremity Assessment Lower Extremity Assessment: Generalized weakness    Cervical / Trunk Assessment Cervical / Trunk Assessment: Normal  Communication   Communication: No difficulties  Cognition Arousal/Alertness: Awake/alert Behavior During Therapy: WFL for tasks assessed/performed Overall Cognitive Status: Within Functional Limits for tasks assessed                                        General Comments      Exercises     Assessment/Plan    PT Assessment Patient needs continued PT services  PT Problem List Decreased strength;Decreased activity tolerance;Decreased balance;Decreased mobility       PT Treatment Interventions Gait  training;Stair training;Functional mobility training;Therapeutic activities;Patient/family education;Therapeutic exercise    PT Goals (Current goals can be found in the Care Plan section)  Acute Rehab PT Goals Patient Stated Goal: return home with family to assist PT Goal Formulation: With patient Time For Goal Achievement: 02/27/19 Potential to Achieve Goals: Good    Frequency Min 3X/week   Barriers to discharge        Co-evaluation               AM-PAC PT "6 Clicks" Mobility  Outcome Measure Help needed turning from your back to your side while in a flat bed without using bedrails?: A Little Help needed moving from lying on your back to sitting on the side of a flat bed without using bedrails?: A Lot Help needed moving to and from a bed to a chair (including a wheelchair)?: A Little Help needed standing up from a chair using your arms (e.g., wheelchair or bedside chair)?: A Little Help needed to walk in hospital room?: A Lot Help needed climbing 3-5 steps with a railing? : A Lot 6 Click Score: 15    End of Session   Activity Tolerance: Patient tolerated treatment well;Patient limited by fatigue Patient left: in chair;with  call bell/phone within reach Nurse Communication: Mobility status PT Visit Diagnosis: Unsteadiness on feet (R26.81);Muscle weakness (generalized) (M62.81);Other abnormalities of gait and mobility (R26.89)    Time: 1610-9604 PT Time Calculation (min) (ACUTE ONLY): 33 min   Charges:   PT Evaluation $PT Eval Moderate Complexity: 1 Mod PT Treatments $Therapeutic Activity: 23-37 mins        11:51 AM, 02/13/19 Ocie Bob, MPT Physical Therapist with [facility] [phone] office [phone] mobile phone

## 2019-02-14 LAB — GLUCOSE, CAPILLARY
Glucose-Capillary: 153 mg/dL — ABNORMAL HIGH (ref 70–99)
Glucose-Capillary: 322 mg/dL — ABNORMAL HIGH (ref 70–99)
Glucose-Capillary: 147 mg/dL — ABNORMAL HIGH (ref 70–99)
Glucose-Capillary: 86 mg/dL (ref 70–99)

## 2019-02-14 LAB — URINE CULTURE: Culture: NO GROWTH

## 2019-02-14 NOTE — Progress Notes (Signed)
PROGRESS NOTE  [name] [name]  XFG:[id number]  DOB: 04-09-1936  DOA: 02/12/2019 PCP: Oval Linsey, MD   Brief Admission Hx: 83 y.o. female with a complex past medical history especially detailed below but significant for COPD, hypertension, diabetes mellitus hypothyroidism coronary artery disease and multiple hospitalizations and ED visits over recent years presents to the emergency department after she had been seen several days ago reporting that she fell at home this morning.  MDM/Assessment & Plan:    1. Acute generalized weakness-patient claims that she is not able to stand and walk because of weakness.  She is mildly dehydrated and we will treat her with IV fluids.   2. Type 2 diabetes mellitus, insulin requiring with neurological complications- monitor blood glucose closely as she has had some hypoglycemia.  Monitor blood glucose 5 times per day.  Sliding scale coverage ordered.  3. Leukocytosis- suspect secondary to yeast UTI, check blood cultures for candidemia. WBC trending down. 4. Symptomatic Yeast UTI-patient is symptomatic from this and we will treat.  Fluconazole oral tablets x14 days ordered.  Pyridium ordered for dysuria.  Recommend urology follow up outpatient.  She has been to [facility] in the past.   5. AKI-RESOLVED likely prerenal and treating with hydration. 6. Fall at home-fall precautions recommended and PT recommending SNF. 7. Hypothyroidism- TSH stable.  8. Anemia of chronic disease- we will monitor CBC carefully. 9. Chronic gait instability- fall precautions and PT evaluation requested. 10. OSA-will offer nightly CPAP. 11. Essential hypertension- stable BP.  12. Chronic pain and opioid dependence - resumed home meds.  Patient had a negative urine drug screen on 02/08/2019.  DVT Prophylaxis: Lovenox Code Status: Full Family Communication: Patient updated at bedside Disposition Plan: INP for IV fluids needed, awaiting SNF  placement  Consultants:    Procedures:    Antimicrobials:  Fluconazole 2/21 >> (14 day course)  Subjective: Pt continues to have dysuria and she is agreeable to go for short term SNF rehab.     Objective: Vitals:   02/13/19 0556 02/13/19 1630 02/13/19 2113 02/14/19 0535  BP: 132/65 124/69 (!) 143/57 139/62  Pulse: 76 80 72 67  Resp: 18 18 18 18   Temp: 98.5 F (36.9 C) 98.6 F (37 C) 98 F (36.7 C) 98.1 F (36.7 C)  TempSrc: Oral  Oral Oral  SpO2: 98% 98% 95% 97%  Weight:      Height:        Intake/Output Summary (Last 24 hours) at 02/14/2019 1412 Last data filed at 02/14/2019 0900 Gross per 24 hour  Intake 1762.43 ml  Output 1400 ml  Net 362.43 ml   Filed Weights   02/12/19 0811 02/12/19 1447  Weight: 85.7 kg 90.3 kg   REVIEW OF SYSTEMS  As per history otherwise all reviewed and reported negative  Exam:  General exam: awake, alert, NAD, cooperative.  Respiratory system: BBS CTA.  No increased work of breathing. Cardiovascular system: normal S1 & S2 heard. No JVD pedal edema. Gastrointestinal system: Abdomen is nondistended, soft and nontender. Normal bowel sounds heard. Central nervous system: Alert and oriented. No focal neurological deficits. Extremities: no cyanosis or clubbing.  Data Reviewed: Basic Metabolic Panel: Recent Labs  Lab 02/08/19 1740 02/12/19 0859 02/13/19 0645  NA 140 142 137  K 4.5 4.2 4.2  CL 107 108 104  CO2 25 25 25   GLUCOSE 265* 64* 211*  BUN 14 20 12   CREATININE 0.72 1.15* 0.72  CALCIUM 9.5 9.4 8.9  MG  --   --  1.8   Liver Function Tests: Recent Labs  Lab 02/08/19 1740 02/12/19 0859 02/13/19 0645  AST 15 14* 12*  ALT 12 13 11   ALKPHOS 51 60 58  BILITOT 0.4 0.3 0.6  PROT 6.9 6.7 6.0*  ALBUMIN 3.9 3.7 3.2*   No results for input(s): LIPASE, AMYLASE in the last 168 hours. No results for input(s): AMMONIA in the last 168 hours. CBC: Recent Labs  Lab 02/08/19 1740 02/12/19 0859 02/13/19 0645  WBC 12.0*  14.0* 11.4*  NEUTROABS 8.0* 10.8* 7.8*  HGB 12.5 12.6 11.5*  HCT 43.8 45.2 39.9  MCV 81.9 83.5 81.6  PLT 300 326 276   Cardiac Enzymes: Recent Labs  Lab 02/08/19 1740 02/12/19 0859  TROPONINI <0.03 <0.03   CBG (last 3)  Recent Labs    02/13/19 2109 02/14/19 0308 02/14/19 1116  GLUCAP 157* 153* 322*   Recent Results (from the past 240 hour(s))  Urine culture     Status: Abnormal   Collection Time: 02/08/19  5:39 PM  Result Value Ref Range Status   Specimen Description   Final    URINE, CLEAN CATCH Performed at [facility], [address], [place], [place] [address]    Special Requests   Final    NONE Performed at [facility], [address], [place], [place] [address]    Culture (A)  Final    <10,000 COLONIES/mL INSIGNIFICANT GROWTH Performed at [facility], [address] [place], [place] [address]    Report Status 02/10/2019 FINAL  Final  Culture, blood (Routine X 2) w Reflex to ID Panel     Status: None (Preliminary result)   Collection Time: 02/12/19  3:02 PM  Result Value Ref Range Status   Specimen Description BLOOD LEFT ANTECUBITAL  Final   Special Requests   Final    BOTTLES DRAWN AEROBIC AND ANAEROBIC Blood Culture adequate volume   Culture   Final    NO GROWTH 2 DAYS Performed at [facility], [address], [place], [place] [address]    Report Status PENDING  Incomplete  Culture, blood (Routine X 2) w Reflex to ID Panel     Status: None (Preliminary result)   Collection Time: 02/12/19  3:03 PM  Result Value Ref Range Status   Specimen Description BLOOD BLOOD LEFT HAND  Final   Special Requests   Final    BOTTLES DRAWN AEROBIC AND ANAEROBIC Blood Culture adequate volume   Culture   Final    NO GROWTH 2 DAYS Performed at [facility], [address], [place], [place] [address]    Report Status PENDING  Incomplete  Urine Culture     Status: None   Collection Time: 02/12/19 11:45 PM  Result Value Ref Range Status   Specimen Description    Final    URINE, CLEAN CATCH Performed at [facility], [address] [place], [place] [address]    Special Requests   Final    NONE Performed at [facility], [address] [place], [place] [address]    Culture   Final    NO GROWTH Performed at [facility], [address] [place], [place] [address]    Report Status 02/14/2019 FINAL  Final     Studies: No results found. Scheduled Meds: . amLODipine  5 mg Oral Daily  . aspirin EC  81 mg Oral Daily  . citalopram  20 mg Oral Daily  . dextrose  25 mL Intravenous Once  . enoxaparin (  LOVENOX) injection  40 mg Subcutaneous Q24H  . fluconazole  200 mg Oral Daily  . gabapentin  300 mg Oral BID  . insulin aspart  0-9 Units Subcutaneous TID WC  . insulin aspart protamine- aspart  30 Units Subcutaneous BID WC  . levothyroxine  25 mcg Oral QAC breakfast  . LORazepam  1 mg Oral QHS  . pantoprazole  40 mg Oral Q0600  . phenazopyridine  200 mg Oral TID WC  . simvastatin  20 mg Oral q1800   Continuous Infusions: . sodium chloride 45 mL/hr at 02/13/19 2011    Active Problems:   Hypothyroidism   DM type 2 causing complication (HCC)   Hyperlipidemia   Obesity   Essential hypertension   Arteriosclerotic cardiovascular disease (ASCVD)   Sleep apnea   Chronic obstructive pulmonary disease (HCC)   Noncompliance   Noncompliance with CPAP treatment   Gait instability   Anemia of chronic disease   Hyperglycemia   Generalized weakness   Fall at home   Symptomatic Yeast UTI   Leukocytosis   AKI (acute kidney injury) (HCC)  Time spent:   Standley Dakins, MD [facility] 02/14/2019, 2:12 PM    LOS: 1 day  How to contact the [facility] Attending or Consulting provider 7A - 7P or covering provider during after hours 7P -7A, for this patient?  1. Check the care team in [facility] and look for a) attending/consulting TRH provider listed and b) the [facility] team listed 2. Log into [email or web address] and use [facility]'s universal password to  access. If you do not have the password, please contact the hospital operator. 3. Locate the [facility] provider you are looking for under [facility] and page to a number that you can be directly reached. 4. If you still have difficulty reaching the provider, please page the [facility] (Director on Call) for the Hospitalists listed on amion for assistance.

## 2019-02-15 LAB — GLUCOSE, CAPILLARY
Glucose-Capillary: 151 mg/dL — ABNORMAL HIGH (ref 70–99)
Glucose-Capillary: 121 mg/dL — ABNORMAL HIGH (ref 70–99)
Glucose-Capillary: 131 mg/dL — ABNORMAL HIGH (ref 70–99)
Glucose-Capillary: 300 mg/dL — ABNORMAL HIGH (ref 70–99)
Glucose-Capillary: 143 mg/dL — ABNORMAL HIGH (ref 70–99)
Glucose-Capillary: 111 mg/dL — ABNORMAL HIGH (ref 70–99)

## 2019-02-15 NOTE — Clinical Social Work Note (Signed)
Clinical Social Work Assessment  Patient Details  Name: [name] [name] MRN: [id number] Date of Birth: November 09, 1936  Date of referral:  02/15/19               Reason for consult:  Facility Placement                Permission sought to share information with:  Other Permission granted to share information::  Yes, Verbal Permission Granted  Name::     Arnold Long  Agency::     Relationship::  Lawyer Information:  [phone]  Housing/Transportation Living arrangements for the past 2 months:  Single Family Home Source of Information:  Patient Patient Interpreter Needed:  None Criminal Activity/Legal Involvement Pertinent to Current Situation/Hospitalization:  No - Comment as needed Significant Relationships:  Adult Children, Spouse Lives with:  Adult Children, Other (Comment)(grand children) Do you feel safe going back to the place where you live?  No Need for family participation in patient care:  Yes (Comment)  Care giving concerns:  Pt states she has walker, commode and shower chair at home.  She generally just uses the shower chair.  States her husband has been admitted to [facility] about a week ago "because he broke both his legs."  States she feels weaker than she ever has before, and although she does not want to go to rehab, she also does not want to have to return to hospital again due to another fall.   Social Worker assessment / plan:  *83 YO Caucasian female diagnosed with Diabetes, UTI and generalized weakness following a fall at home.  Although she states her son and grandaughter live with her, she also states son is currently in hospital  and granddaughter was not at home when she fell.  Husband has been at [facility] for about a week.  Pt is willing to go to [facility] due to limited support at home and overall weakness to a degree that she has not previously experienced. Employment status:  Retired Database administrator PT Recommendations:  Skilled  Nursing Facility Information / Referral to community resources:     Patient/Family's Response to care:  Ambivalent about going to SNF.  Patient/Family's Understanding of and Emotional Response to Diagnosis, Current Treatment, and Prognosis: Good understanding, appropriate emotional response.  Emotional Assessment Appearance:  Appears stated age Attitude/Demeanor/Rapport:  Engaged Affect (typically observed):  Appropriate Orientation:  Oriented to Self, Oriented to  Time, Oriented to Place, Oriented to Situation Alcohol / Substance use:  Not Applicable Psych involvement (Current and /or in the community):     Discharge Needs  Concerns to be addressed:  Discharge Planning Concerns Readmission within the last 30 days:  No Current discharge risk:  Lack of support system Barriers to Discharge:  No SNF bed   Ida Rogue, LCSW 02/15/2019, 9:58 AM

## 2019-02-15 NOTE — Progress Notes (Signed)
Physical Therapy Treatment Patient Details Name: [name] [name] MRN: [id number] DOB: [birth year] Today's Date: 02/15/2019    History of Present Illness [name] [name] is a 83 y.o. female with a complex past medical history especially detailed below but significant for COPD, hypertension, diabetes mellitus hypothyroidism coronary artery disease and multiple hospitalizations and ED visits over recent years presents to the emergency department after she had been seen several days ago reporting that she fell at home this morning.  She says that she has been generally weak and has difficulty standing.  She feels that her UTI is getting worse.  She was seen several days ago and diagnosed with a UTI and treated with ceftriaxone and discharged home on cephalexin.  She reports that she has been taking the medications as prescribed.  The urine culture did not have any significant bacterial growth.  She denies having chest pain and shortness of breath.  She denies having any significant trauma from the fall.  She apparently laid herself down when she started feeling weak.  She did not hit her head.  She was not down on the ground for prolonged period of time.  She does have poorly controlled diabetes mellitus.  She says that her blood glucose was tested by EMS and she was not hypoglycemic at the time of falling out.  She does have a history of having hypoglycemia from insulin.  She takes 70/30 insulin twice daily.  She also has a history of bradycardia.  She has had multiple syncopal episodes in the past that have been worked up during previous admissions.  She denies abdominal pain and headaches.  She reports that she has been having some dizziness as well.  Her main complaint is weakness in the lower extremities.  No vision changes.  She denies having weakness in the extremities.  The patient on had a an MRI of the brain done several days ago when she was seen in the ED that did not show any findings of acute CVA.   She had chronic microvascular changes reported from that study.    PT Comments    Patient in bed upon arrival and asleep. Difficulty arousing patient. Patient agreeable to participate in therapy stating she needs to go to the bathroom. Minimal to moderate assistance required for all bed mobility, transfers and ambulation with RW. Patient has tendency to lean posteriorly and to right while seated and ambulating. Significant difficulty advancing Lt > Rt feet during ambulation with RW and turning and following directions difficult.  Patient would continue to benefit from skilled physical therapy in current environment and next venue to continue return to prior function and increase strength, endurance, balance, coordination, and functional mobility and gait skills.     Follow Up Recommendations  SNF     Equipment Recommendations  None recommended by PT    Recommendations for Other Services       Precautions / Restrictions Precautions Precautions: Fall Restrictions Weight Bearing Restrictions: No    Mobility  Bed Mobility Overal bed mobility: Needs Assistance Bed Mobility: Rolling;Sidelying to Sit Rolling: Min assist Sidelying to sit: Mod assist       General bed mobility comments: slow labored movement   Transfers Overall transfer level: Needs assistance Equipment used: Rolling walker (2 wheeled) Transfers: Sit to/from [employer] Sit to Stand: Min assist Stand pivot transfers: Mod assist       General transfer comment: unsteady on feet especially LLE due to weakness  Ambulation/Gait Ambulation/Gait assistance: Mod assist  Gait Distance (Feet): 20 Feet Assistive device: Rolling walker (2 wheeled) Gait Pattern/deviations: Decreased step length - right;Decreased step length - left;Decreased stride length;Decreased stance time - left;Decreased dorsiflexion - left;Leaning posteriorly;Trunk flexed Gait velocity: decreased   General Gait Details: slow quite  labored cadence with difficulty making turns due to LLE weakness and difficulty advancing feet   Stairs             Wheelchair Mobility    Modified Rankin (Stroke Patients Only)       Balance Overall balance assessment: Needs assistance Sitting-balance support: Feet supported Sitting balance-Leahy Scale: Fair   Postural control: Right lateral lean Standing balance support: Bilateral upper extremity supported;During functional activity Standing balance-Leahy Scale: Poor Standing balance comment: poor using RW                            Cognition Arousal/Alertness: Lethargic Behavior During Therapy: WFL for tasks assessed/performed Overall Cognitive Status: Impaired/Different from baseline Area of Impairment: Attention;Problem solving                             Problem Solving: Slow processing;Requires verbal cues;Requires tactile cues        Exercises      General Comments        Pertinent Vitals/Pain Pain Assessment: No/denies pain    Home Living                      Prior Function            PT Goals (current goals can now be found in the care plan section) Acute Rehab PT Goals Patient Stated Goal: return home with family to assist PT Goal Formulation: With patient Time For Goal Achievement: 02/27/19 Potential to Achieve Goals: Good Progress towards PT goals: Not progressing toward goals - comment(patient more lethargic today and more difficulty with mobility)    Frequency    Min 3X/week      PT Plan Current plan remains appropriate    Co-evaluation              AM-PAC PT "6 Clicks" Mobility   Outcome Measure  Help needed turning from your back to your side while in a flat bed without using bedrails?: A Little Help needed moving from lying on your back to sitting on the side of a flat bed without using bedrails?: A Lot Help needed moving to and from a bed to a chair (including a wheelchair)?: A  Lot Help needed standing up from a chair using your arms (e.g., wheelchair or bedside chair)?: A Little Help needed to walk in hospital room?: A Lot Help needed climbing 3-5 steps with a railing? : Total 6 Click Score: 13    End of Session Equipment Utilized During Treatment: Gait belt Activity Tolerance: Patient tolerated treatment well;Patient limited by fatigue;Patient limited by lethargy Patient left: in chair;with call bell/phone within reach;with chair alarm set(nursingn otified) Nurse Communication: Mobility status PT Visit Diagnosis: Unsteadiness on feet (R26.81);Muscle weakness (generalized) (M62.81);Other abnormalities of gait and mobility (R26.89)     Time: 1340-1410 PT Time Calculation (min) (ACUTE ONLY): 30 min  Charges:  $Gait Training: 8-22 mins $Therapeutic Activity: 8-22 mins                     Katina Dung. Hartnett-Rands, MS, PT [place] [facility] [place] [phone] 02/15/2019, 2:15  PM   

## 2019-02-15 NOTE — Progress Notes (Signed)
Patient refused CPAP tonight. Said she didn't wear it last night either. Hospital unit still in room.

## 2019-02-15 NOTE — Clinical Social Work Placement (Signed)
   CLINICAL SOCIAL WORK PLACEMENT  NOTE  Date:  02/15/2019  Patient Details  Name: [name] [name] MRN: [id number] Date of Birth: 1936/03/16  Clinical Social Work is seeking post-discharge placement for this patient at the Skilled  Nursing Facility level of care (*CSW will initial, date and re-position this form in  chart as items are completed):  Yes   Patient/family provided with [facility] Clinical Social Work Department's list of facilities offering this level of care within the geographic area requested by the patient (or if unable, by the patient's family).  Yes   Patient/family informed of their freedom to choose among providers that offer the needed level of care, that participate in Medicare, Medicaid or managed care program needed by the patient, have an available bed and are willing to accept the patient.  Yes   Patient/family informed of [facility]'s ownership interest in [facility] and [facility], as well as of the fact that they are under no obligation to receive care at these facilities.  PASRR submitted to EDS on       PASRR number received on       Existing PASRR number confirmed on 02/15/19     FL2 transmitted to all facilities in geographic area requested by pt/family on 02/15/19     FL2 transmitted to all facilities within larger geographic area on       Patient informed that his/her managed care company has contracts with or will negotiate with certain facilities, including the following:        Yes   Patient/family informed of bed offers received.  Patient chooses bed at [facility]     Physician recommends and patient chooses bed at      Patient to be transferred to   on  .  Patient to be transferred to facility by       Patient family notified on   of transfer.  Name of family member notified:        PHYSICIAN       Additional Comment:  Pt reluctantly chooses bed at [facility].  Myrtie Hawk and asked her to begin  authorization.   _______________________________________________ Ida Rogue, LCSW 02/15/2019, 11:58 AM

## 2019-02-15 NOTE — Progress Notes (Signed)
PROGRESS NOTE  [name] [name]  QJJ:[id number]  DOB: 1936/08/16  DOA: 02/12/2019 PCP: Oval Linsey, MD  Brief Admission Hx: 83 y.o. female with a complex past medical history especially detailed below but significant for COPD, hypertension, diabetes mellitus hypothyroidism coronary artery disease and multiple hospitalizations and ED visits over recent years presents to the emergency department after she had been seen several days ago reporting that she fell at home this morning.  MDM/Assessment & Plan:   1. Acute generalized weakness-patient claims that she is not able to stand and walk because of weakness.  She is mildly dehydrated and we will treat her with IV fluids.   2. Type 2 diabetes mellitus, insulin requiring with neurological complications- monitor blood glucose closely as she has had some hypoglycemia.  Monitor blood glucose 5 times per day.  Sliding scale coverage ordered.  3. Leukocytosis- suspect secondary to yeast UTI, check blood cultures for candidemia. WBC trended down with treatment. 4. Symptomatic Yeast UTI-patient is symptomatic from this and we will treat.  Fluconazole oral tablets x14 days ordered.  Pyridium ordered for dysuria.  Recommend urology follow up outpatient.  She has been to [facility] in the past.   5. AKI-RESOLVED likely prerenal and treated with IV hydration. 6. Fall at home-fall precautions recommended and PT recommending SNF.  Pt now agreeable.  7. Hypothyroidism- TSH stable.  8. Anemia of chronic disease- stable. 9. Chronic gait instability- fall precautions and PT recommending SNF. 10. OSA-will offer nightly CPAP. 11. Essential hypertension- stable BP.  12. Chronic pain and opioid dependence - resumed home meds.  Patient had a negative urine drug screen on 02/08/2019.  DVT Prophylaxis: Lovenox Code Status: Full Family Communication: Patient updated at bedside Disposition Plan: INP, awaiting SNF placement  Consultants:  PT and  social work  Procedures:    Antimicrobials:  Fluconazole 2/21 >> (14 day course)  Subjective: Pt continues to have dysuria but it is slightly improved.  She is still agreeable to go to SNF.     Objective: Vitals:   02/14/19 2113 02/14/19 2333 02/15/19 0543 02/15/19 1310  BP: (!) 163/63  (!) 159/59 (!) 153/67  Pulse: 65 76 63 66  Resp:  16  18  Temp: 98.2 F (36.8 C)  97.6 F (36.4 C) 98.6 F (37 C)  TempSrc: Oral  Oral Oral  SpO2: 94% 94% 95% 93%  Weight:   91.6 kg   Height:        Intake/Output Summary (Last 24 hours) at 02/15/2019 1601 Last data filed at 02/15/2019 1500 Gross per 24 hour  Intake 1740.6 ml  Output 1500 ml  Net 240.6 ml   Filed Weights   02/12/19 0811 02/12/19 1447 02/15/19 0543  Weight: 85.7 kg 90.3 kg 91.6 kg   REVIEW OF SYSTEMS  As per history otherwise all reviewed and reported negative  Exam:  General exam: awake, alert, NAD, cooperative.  Respiratory system: BBS CTA.  No increased work of breathing. Cardiovascular system: normal S1 & S2 heard. No JVD pedal edema. Gastrointestinal system: Abdomen is nondistended, soft and nontender. Normal bowel sounds heard. Central nervous system: Alert and oriented. No focal neurological deficits. Extremities: no cyanosis or clubbing.  Data Reviewed: Basic Metabolic Panel: Recent Labs  Lab 02/08/19 1740 02/12/19 0859 02/13/19 0645  NA 140 142 137  K 4.5 4.2 4.2  CL 107 108 104  CO2 25 25 25   GLUCOSE 265* 64* 211*  BUN 14 20 12   CREATININE 0.72 1.15* 0.72  CALCIUM 9.5  9.4 8.9  MG  --   --  1.8   Liver Function Tests: Recent Labs  Lab 02/08/19 1740 02/12/19 0859 02/13/19 0645  AST 15 14* 12*  ALT ALKPHOS 51 60 58  BILITOT 0.4 0.3 0.6  PROT 6.9 6.7 6.0*  ALBUMIN 3.9 3.7 3.2*   No results for input(s): LIPASE, AMYLASE in the last 168 hours. No results for input(s): AMMONIA in the last 168 hours. CBC: Recent Labs  Lab 02/08/19 1740 02/12/19 0859 02/13/19 0645  WBC  12.0* 14.0* 11.4*  NEUTROABS 8.0* 10.8* 7.8*  HGB 12.5 12.6 11.5*  HCT 43.8 45.2 39.9  MCV 81.9 83.5 81.6  PLT 300 326 276   Cardiac Enzymes: Recent Labs  Lab 02/08/19 1740 02/12/19 0859  TROPONINI <0.03 <0.03   CBG (last 3)  Recent Labs    02/15/19 0303 02/15/19 0733 02/15/19 1122  GLUCAP 121* 131* 300*   Recent Results (from the past 240 hour(s))  Urine culture     Status: Abnormal   Collection Time: 02/08/19  5:39 PM  Result Value Ref Range Status   Specimen Description   Final    URINE, CLEAN CATCH Performed at [facility], [address], [place], [place] [address]    Special Requests   Final    NONE Performed at [facility], [address] [place], [place] [address]    Culture (A)  Final    <10,000 COLONIES/mL INSIGNIFICANT GROWTH Performed at [facility], [address] [place], [place] [address]    Report Status 02/10/2019 FINAL  Final  Culture, blood (Routine X 2) w Reflex to ID Panel     Status: None (Preliminary result)   Collection Time: 02/12/19  3:02 PM  Result Value Ref Range Status   Specimen Description BLOOD LEFT ANTECUBITAL  Final   Special Requests   Final    BOTTLES DRAWN AEROBIC AND ANAEROBIC Blood Culture adequate volume   Culture   Final    NO GROWTH 3 DAYS Performed at [facility], [address] [place], [place] [address]    Report Status PENDING  Incomplete  Culture, blood (Routine X 2) w Reflex to ID Panel     Status: None (Preliminary result)   Collection Time: 02/12/19  3:03 PM  Result Value Ref Range Status   Specimen Description BLOOD BLOOD LEFT HAND  Final   Special Requests   Final    BOTTLES DRAWN AEROBIC AND ANAEROBIC Blood Culture adequate volume   Culture   Final    NO GROWTH 3 DAYS Performed at [facility], [address], [place], [place] [address]    Report Status PENDING  Incomplete  Urine Culture     Status: None   Collection Time: 02/12/19 11:45 PM  Result Value Ref Range Status   Specimen  Description   Final    URINE, CLEAN CATCH Performed at [facility], [address], [place], [place] [address]    Special Requests   Final    NONE Performed at [facility], [address], [place], [place] [address]    Culture   Final    NO GROWTH Performed at [facility], [address], [place], [place] [address]    Report Status 02/14/2019 FINAL  Final     Studies: No results found. Scheduled Meds: . amLODipine  5 mg Oral Daily  . aspirin EC  81 mg Oral Daily  . citalopram  20 mg Oral Daily  .  dextrose  25 mL Intravenous Once  . enoxaparin (LOVENOX) injection  40 mg Subcutaneous Q24H  . fluconazole  200 mg Oral Daily  . gabapentin  300 mg Oral BID  . insulin aspart  0-9 Units Subcutaneous TID WC  . insulin aspart protamine- aspart  30 Units Subcutaneous BID WC  . levothyroxine  25 mcg Oral QAC breakfast  . LORazepam  1 mg Oral QHS  . pantoprazole  40 mg Oral Q0600  . simvastatin  20 mg Oral q1800   Continuous Infusions: . sodium chloride 45 mL/hr at 02/14/19 1952    Active Problems:   Hypothyroidism   DM type 2 causing complication (HCC)   Hyperlipidemia   Obesity   Essential hypertension   Arteriosclerotic cardiovascular disease (ASCVD)   Sleep apnea   Chronic obstructive pulmonary disease (HCC)   Noncompliance   Noncompliance with CPAP treatment   Gait instability   Anemia of chronic disease   Hyperglycemia   Generalized weakness   Fall at home   Symptomatic Yeast UTI   Leukocytosis   AKI (acute kidney injury) (HCC)  Time spent:   Standley Dakins, MD [facility] 02/15/2019, 4:01 PM    LOS: 2 days  How to contact the [facility] Attending or Consulting provider 7A - 7P or covering provider during after hours 7P -7A, for this patient?  1. Check the care team in [facility] and look for a) attending/consulting TRH provider listed and b) the [facility] team listed 2. Log into [email or web address] and use [place]'s universal password to access. If you do not have  the password, please contact the hospital operator. 3. Locate the [facility] provider you are looking for under [facility] and page to a number that you can be directly reached. 4. If you still have difficulty reaching the provider, please page the [facility] (Director on Call) for the Hospitalists listed on amion for assistance.

## 2019-02-15 NOTE — [place] FL2 (Signed)
[place] MEDICAID FL2 LEVEL OF CARE SCREENING TOOL     IDENTIFICATION  Patient Name: [name] [name] Birthdate: 11/02/1936 Sex: female Admission Date (Current Location): 02/12/2019  [facility] and [place] Number:  [employer] and Address:  [facility],  [address], [place] [address]      Provider Number: [id number]  Attending Physician Name and Address:  Cleora Fleet, MD  Relative Name and Phone Number:       Current Level of Care: Hospital Recommended Level of Care: Skilled Nursing Facility Prior Approval Number:    Date Approved/Denied:   PASRR Number: [id number]  Discharge Plan: SNF    Current Diagnoses: Patient Active Problem List   Diagnosis Date Noted  . Generalized weakness 02/12/2019  . Fall at home 02/12/2019  . Symptomatic Yeast UTI 02/12/2019  . Leukocytosis 02/12/2019  . AKI (acute kidney injury) (HCC) 02/12/2019  . COPD with acute exacerbation (HCC) 12/13/2018  . CVA (cerebral vascular accident) (HCC) 11/01/2018  . Syncope   . Loss of consciousness (HCC) 06/10/2018  . Hyperglycemia 06/10/2018  . Bradycardia, sinus 06/10/2018  . Diastolic dysfunction 09/29/2017  . CAD (coronary artery disease) 09/28/2017  . Acute lower UTI 09/28/2017  . Ischemic stroke (HCC) 05/10/2017  . Acute ischemic stroke (HCC) 05/10/2017  . Stroke (HCC) 08/01/2016  . Dysarthria 03/18/2016  . Anemia of chronic disease 11/19/2015  . OSA (obstructive sleep apnea) 11/19/2015  . Left facial numbness 08/29/2015  . Gait instability 06/26/2015  . COPD exacerbation (HCC) 11/13/2014  . Weakness generalized 12/10/2013  . TIA (transient ischemic attack) 06/22/2012  . Left-sided headache 06/22/2012  . Noncompliance 06/22/2012  . Noncompliance with CPAP treatment 06/22/2012  . Psoriasis 01/07/2012  . Cerebrovascular disease   . Chronic back pain   . Chronic obstructive pulmonary disease (HCC)   . DM type 2 causing complication (HCC) 02/14/2010  .  Obesity 02/14/2010  . Arteriosclerotic cardiovascular disease (ASCVD) 02/14/2010  . Hypothyroidism 02/06/2010  . Hyperlipidemia 02/06/2010  . Essential hypertension 02/06/2010  . Sleep apnea 02/06/2010    Orientation RESPIRATION BLADDER Height & Weight     Self, Time, Situation, Place  Normal Continent Weight: 91.6 kg Height:  5\' 4"  (162.6 cm)  BEHAVIORAL SYMPTOMS/MOOD NEUROLOGICAL BOWEL NUTRITION STATUS  (none) (none) Continent Diet(Heart healthy, Carb Modified)  AMBULATORY STATUS COMMUNICATION OF NEEDS Skin   Extensive Assist Verbally Normal                       Personal Care Assistance Level of Assistance  Bathing, Feeding, Dressing Bathing Assistance: Limited assistance Feeding assistance: Independent Dressing Assistance: Limited assistance     Functional Limitations Info  Sight, Hearing, Speech Sight Info: Adequate Hearing Info: Adequate Speech Info: Adequate    SPECIAL CARE FACTORS FREQUENCY  PT (By licensed PT)     PT Frequency: 5X/W              Contractures      Additional Factors Info  Code Status, Allergies Code Status Info: Full Allergies Info: Codeine           Current Medications (02/15/2019):  This is the current hospital active medication list Current Facility-Administered Medications  Medication Dose Route Frequency Provider Last Rate Last Dose  . 0.9 %  sodium chloride infusion   Intravenous Continuous Laural Benes, Clanford L, MD 45 mL/hr at 02/14/19 1952    . acetaminophen (TYLENOL) tablet 650 mg  650 mg Oral Q6H PRN Cleora Fleet, MD  650 mg at 02/13/19 1051   Or  . acetaminophen (TYLENOL) suppository 650 mg  650 mg Rectal Q6H PRN Johnson, Clanford L, MD      . amLODipine (NORVASC) tablet 5 mg  5 mg Oral Daily Johnson, Clanford L, MD   5 mg at 02/15/19 1008  . aspirin EC tablet 81 mg  81 mg Oral Daily Johnson, Clanford L, MD   81 mg at 02/15/19 1009  . citalopram (CELEXA) tablet 20 mg  20 mg Oral Daily Johnson, Clanford L, MD    20 mg at 02/15/19 1008  . dextrose 50 % solution 25 mL  25 mL Intravenous Once Cleora Fleet, MD   Stopped at 02/12/19 1457  . enoxaparin (LOVENOX) injection 40 mg  40 mg Subcutaneous Q24H Johnson, Clanford L, MD   40 mg at 02/14/19 2130  . fluconazole (DIFLUCAN) tablet 200 mg  200 mg Oral Daily Johnson, Clanford L, MD   200 mg at 02/15/19 1009  . gabapentin (NEURONTIN) capsule 300 mg  300 mg Oral BID Johnson, Clanford L, MD   300 mg at 02/15/19 1008  . insulin aspart (novoLOG) injection 0-9 Units  0-9 Units Subcutaneous TID WC Laural Benes, Clanford L, MD   1 Units at 02/15/19 [phone]  . insulin aspart protamine- aspart (NOVOLOG MIX 70/30) injection 30 Units  30 Units Subcutaneous BID WC Johnson, Clanford L, MD   30 Units at 02/15/19 0849  . ipratropium-albuterol (DUONEB) 0.5-2.5 (3) MG/3ML nebulizer solution 3 mL  3 mL Nebulization Q4H PRN Johnson, Clanford L, MD      . levothyroxine (SYNTHROID, LEVOTHROID) tablet 25 mcg  25 mcg Oral QAC breakfast Laural Benes, Clanford L, MD   25 mcg at 02/15/19 0537  . LORazepam (ATIVAN) tablet 1 mg  1 mg Oral QHS Johnson, Clanford L, MD   1 mg at 02/14/19 2130  . nitroGLYCERIN (NITROSTAT) SL tablet 0.4 mg  0.4 mg Sublingual Q5 min PRN Johnson, Clanford L, MD      . ondansetron (ZOFRAN) tablet 4 mg  4 mg Oral Q6H PRN Johnson, Clanford L, MD       Or  . ondansetron (ZOFRAN) injection 4 mg  4 mg Intravenous Q6H PRN Johnson, Clanford L, MD      . oxyCODONE (Oxy IR/ROXICODONE) immediate release tablet 5 mg  5 mg Oral Q4H PRN Johnson, Clanford L, MD   5 mg at 02/15/19 0536  . pantoprazole (PROTONIX) EC tablet 40 mg  40 mg Oral Q0600 Johnson, Clanford L, MD   40 mg at 02/15/19 0536  . phenazopyridine (PYRIDIUM) tablet 200 mg  200 mg Oral TID WC Johnson, Clanford L, MD   200 mg at 02/15/19 0850  . senna-docusate (Senokot-S) tablet 1 tablet  1 tablet Oral BID PRN Cleora Fleet, MD   1 tablet at 02/14/19 1445  . simvastatin (ZOCOR) tablet 20 mg  20 mg Oral q1800  Johnson, Clanford L, MD   20 mg at 02/14/19 1637  . traZODone (DESYREL) tablet 25 mg  25 mg Oral QHS PRN Cleora Fleet, MD         Discharge Medications: Please see discharge summary for a list of discharge medications.  Relevant Imaging Results:  Relevant Lab Results:   Additional Information    Ida Rogue, LCSW

## 2019-02-15 NOTE — Progress Notes (Signed)
Once again patient tried CPAP which she maybe kept on for about an hour. CPAP works properly patient has other issues which prevent use.

## 2019-02-16 LAB — GLUCOSE, CAPILLARY
GLUCOSE-CAPILLARY: 109 mg/dL — AB (ref 70–99)
Glucose-Capillary: 114 mg/dL — ABNORMAL HIGH (ref 70–99)
Glucose-Capillary: 277 mg/dL — ABNORMAL HIGH (ref 70–99)
Glucose-Capillary: 139 mg/dL — ABNORMAL HIGH (ref 70–99)
Glucose-Capillary: 234 mg/dL — ABNORMAL HIGH (ref 70–99)

## 2019-02-16 NOTE — Progress Notes (Signed)
PROGRESS NOTE  [name] [name]  HQI:[id number]  DOB: 01-Sep-1936  DOA: 02/12/2019 PCP: Oval Linseyondiego, Richard, MD  Brief Admission Hx: 83 y.o. female with a complex past medical history especially detailed below but significant for COPD, hypertension, diabetes mellitus hypothyroidism coronary artery disease and multiple hospitalizations and ED visits over recent years presents to the emergency department after she had been seen several days ago reporting that she fell at home this morning.  MDM/Assessment & Plan:   1. Acute generalized weakness-patient claims that she is not able to stand and walk because of weakness.  She is mildly dehydrated and we will treat her with IV fluids.   2. Type 2 diabetes mellitus, insulin requiring with neurological complications- monitor blood glucose closely as she has had some hypoglycemia.  Monitor blood glucose 5 times per day.  Sliding scale coverage ordered.  3. Leukocytosis- suspect secondary to yeast UTI, check blood cultures for candidemia. WBC trended down with treatment. 4. Symptomatic Yeast UTI-patient is symptomatic from this and we will treat.  Fluconazole oral tablets x14 days ordered.  Pyridium ordered for dysuria.  Recommend urology follow up outpatient.  She has been [facility] in the past.   5. AKI-RESOLVED likely prerenal and treated with IV hydration. 6. Fall at home-fall precautions recommended and PT recommending SNF.  Pt now agreeable.  7. Hypothyroidism- TSH stable.  8. Anemia of chronic disease- stable. 9. Chronic gait instability- fall precautions and PT recommending SNF. 10. OSA-will offer nightly CPAP. 11. Essential hypertension- stable BP.  12. Chronic pain and opioid dependence - resumed home meds.  Patient had a negative urine drug screen on 02/08/2019.  DVT Prophylaxis: Lovenox Code Status: Full Family Communication: Patient updated at bedside Disposition Plan: INP, awaiting SNF placement  Consultants:  PT and  social work  Procedures:    Antimicrobials:  Fluconazole 2/21 >> (14 day course)  Subjective: Denies any new complaints.  Objective: Vitals:   02/15/19 1310 02/15/19 1951 02/15/19 2207 02/16/19 1505  BP: (!) 153/67  (!) 155/71 (!) 155/64  Pulse: 66  66 69  Resp: 18   18  Temp: 98.6 F (37 C)  98.4 F (36.9 C) 98.6 F (37 C)  TempSrc: Oral  Oral Oral  SpO2: 93% 94% 94% 93%  Weight:      Height:        Intake/Output Summary (Last 24 hours) at 02/16/2019 1812 Last data filed at 02/16/2019 1256 Gross per 24 hour  Intake 360 ml  Output 1650 ml  Net -1290 ml   Filed Weights   02/12/19 0811 02/12/19 1447 02/15/19 0543  Weight: 85.7 kg 90.3 kg 91.6 kg   REVIEW OF SYSTEMS  As per history otherwise all reviewed and reported negative  Exam:  General exam: Alert, awake, no acute distress Respiratory system: clear bilaterally Cardiovascular system: S1, S2, regular rate and rhythm. No JVD pedal edema. Gastrointestinal system: Abdomen is nondistended, soft and nontender. Normal bowel sounds heard. Central nervous system: . No focal neurological deficits. Extremities: no cyanosis or clubbing.  Data Reviewed: Basic Metabolic Panel: Recent Labs  Lab 02/12/19 0859 02/13/19 0645  NA 142 137  K 4.2 4.2  CL 108 104  CO2 25 25  GLUCOSE 64* 211*  BUN 20 12  CREATININE 1.15* 0.72  CALCIUM 9.4 8.9  MG  --  1.8   Liver Function Tests: Recent Labs  Lab 02/12/19 0859 02/13/19 0645  AST 14* 12*  ALT 13 11  ALKPHOS 60 58  BILITOT 0.3  0.6  PROT 6.7 6.0*  ALBUMIN 3.7 3.2*   No results for input(s): LIPASE, AMYLASE in the last 168 hours. No results for input(s): AMMONIA in the last 168 hours. CBC: Recent Labs  Lab 02/12/19 0859 02/13/19 0645  WBC 14.0* 11.4*  NEUTROABS 10.8* 7.8*  HGB 12.6 11.5*  HCT 45.2 39.9  MCV 83.5 81.6  PLT 326 276   Cardiac Enzymes: Recent Labs  Lab 02/12/19 0859  TROPONINI <0.03   CBG (last 3)  Recent Labs    02/16/19 0727  02/16/19 1102 02/16/19 1603  GLUCAP 114* 277* 139*   Recent Results (from the past 240 hour(s))  Urine culture     Status: Abnormal   Collection Time: 02/08/19  5:39 PM  Result Value Ref Range Status   Specimen Description   Final    URINE, CLEAN CATCH Performed at [facility], [address] [place], [place] [address]    Special Requests   Final    NONE Performed at [facility], [address] [place], [place] [address]    Culture (A)  Final    <10,000 COLONIES/mL INSIGNIFICANT GROWTH Performed at [facility], [address] [place], [place] [address]    Report Status 02/10/2019 FINAL  Final  Culture, blood (Routine X 2) w Reflex to ID Panel     Status: None (Preliminary result)   Collection Time: 02/12/19  3:02 PM  Result Value Ref Range Status   Specimen Description BLOOD LEFT ANTECUBITAL  Final   Special Requests   Final    BOTTLES DRAWN AEROBIC AND ANAEROBIC Blood Culture adequate volume   Culture   Final    NO GROWTH 4 DAYS Performed at [facility], [address] [place], [place] [address]    Report Status PENDING  Incomplete  Culture, blood (Routine X 2) w Reflex to ID Panel     Status: None (Preliminary result)   Collection Time: 02/12/19  3:03 PM  Result Value Ref Range Status   Specimen Description BLOOD BLOOD LEFT HAND  Final   Special Requests   Final    BOTTLES DRAWN AEROBIC AND ANAEROBIC Blood Culture adequate volume   Culture   Final    NO GROWTH 4 DAYS Performed at [facility], [address], [place], [place] [address]    Report Status PENDING  Incomplete  Urine Culture     Status: None   Collection Time: 02/12/19 11:45 PM  Result Value Ref Range Status   Specimen Description   Final    URINE, CLEAN CATCH Performed at [facility], [address] [place], [place] [address]    Special Requests   Final    NONE Performed at [facility], [address], [place], [place] [address]    Culture   Final    NO GROWTH Performed at [facility], [address], [place], [place] [address]    Report Status 02/14/2019 FINAL  Final     Studies: No results found. Scheduled Meds: . amLODipine  5 mg Oral Daily  . aspirin EC  81 mg Oral Daily  . citalopram  20 mg Oral Daily  . dextrose  25 mL Intravenous Once  . enoxaparin (LOVENOX) injection  40 mg Subcutaneous Q24H  . fluconazole  200 mg Oral Daily  . gabapentin  300 mg Oral BID  . insulin aspart  0-9 Units Subcutaneous TID WC  . insulin aspart protamine- aspart  30 Units Subcutaneous BID WC  . levothyroxine  25 mcg Oral QAC breakfast  . LORazepam  1 mg Oral QHS  . pantoprazole  40 mg Oral Q0600  . simvastatin  20 mg Oral q1800   Continuous Infusions: . sodium chloride 30 mL/hr at 02/15/19 1708    Active Problems:   Hypothyroidism   DM type 2 causing complication (HCC)   Hyperlipidemia   Obesity   Essential hypertension   Arteriosclerotic cardiovascular disease (ASCVD)   Sleep apnea   Chronic obstructive pulmonary disease (HCC)   Noncompliance   Noncompliance with CPAP treatment   Gait instability   Anemia of chronic disease   Hyperglycemia   Generalized weakness   Fall at home   Symptomatic Yeast UTI   Leukocytosis   AKI (acute kidney injury) (HCC)  Time spent:  Erick Blinks, MD [facility] 02/16/2019, 6:12 PM    LOS: 3 days

## 2019-02-16 NOTE — Progress Notes (Signed)
Patient still refusing CPAP. Unit still at bedside.

## 2019-02-17 ENCOUNTER — Inpatient Hospital Stay (HOSPITAL_COMMUNITY): Payer: Medicare HMO

## 2019-02-17 ENCOUNTER — Other Ambulatory Visit: Payer: Self-pay

## 2019-02-17 DIAGNOSIS — I639 Cerebral infarction, unspecified: Secondary | ICD-10-CM

## 2019-02-17 LAB — COMPREHENSIVE METABOLIC PANEL
ALT: 18 U/L (ref 0–44)
AST: 16 U/L (ref 15–41)
Albumin: 3.4 g/dL — ABNORMAL LOW (ref 3.5–5.0)
Alkaline Phosphatase: 64 U/L (ref 38–126)
Anion gap: 7 (ref 5–15)
BUN: 12 mg/dL (ref 8–23)
CO2: 26 mmol/L (ref 22–32)
Calcium: 9.1 mg/dL (ref 8.9–10.3)
Chloride: 103 mmol/L (ref 98–111)
Creatinine, Ser: 0.88 mg/dL (ref 0.44–1.00)
GFR calc Af Amer: >60 mL/min (ref >60)
GFR calc non Af Amer: >60 mL/min (ref >60)
GLUCOSE: 213 mg/dL — AB (ref 70–99)
POTASSIUM: 4.1 mmol/L (ref 3.5–5.1)
Sodium: 136 mmol/L (ref 135–145)
TOTAL PROTEIN: 6.4 g/dL — AB (ref 6.5–8.1)
Total Bilirubin: 0.4 mg/dL (ref 0.3–1.2)

## 2019-02-17 LAB — CULTURE, BLOOD (ROUTINE X 2)
Culture: NO GROWTH
Culture: NO GROWTH
Special Requests: ADEQUATE
Special Requests: ADEQUATE

## 2019-02-17 LAB — GLUCOSE, CAPILLARY
GLUCOSE-CAPILLARY: 154 mg/dL — AB (ref 70–99)
GLUCOSE-CAPILLARY: 301 mg/dL — AB (ref 70–99)
Glucose-Capillary: 150 mg/dL — ABNORMAL HIGH (ref 70–99)
Glucose-Capillary: 80 mg/dL (ref 70–99)
Glucose-Capillary: 238 mg/dL — ABNORMAL HIGH (ref 70–99)

## 2019-02-17 LAB — CBC
HCT: 40.6 % (ref 36.0–46.0)
HEMOGLOBIN: 11.7 g/dL — AB (ref 12.0–15.0)
MCH: 23.4 pg — ABNORMAL LOW (ref 26.0–34.0)
MCHC: 28.8 g/dL — ABNORMAL LOW (ref 30.0–36.0)
MCV: 81.2 fL (ref 80.0–100.0)
Platelets: 308 10*3/uL (ref 150–400)
RBC: 5 MIL/uL (ref 3.87–5.11)
RDW: 17.4 % — ABNORMAL HIGH (ref 11.5–15.5)
WBC: 9.9 10*3/uL (ref 4.0–10.5)
nRBC: 0 % (ref 0.0–0.2)

## 2019-02-17 MED ORDER — SODIUM CHLORIDE 0.9 % IV SOLN
INTRAVENOUS | Status: DC
  Administered 2019-02-17 – 2019-02-21 (×6): via INTRAVENOUS

## 2019-02-17 MED ORDER — STROKE: EARLY STAGES OF RECOVERY BOOK
Freq: Once | Status: AC
  Administered 2019-02-17: 18:00:00

## 2019-02-17 NOTE — Consult Note (Signed)
[facility] Tele[facility]urology Consult Services   Date of Service:   02/17/2019 13:34:12  Impression:     .  Right Hemispheric Infarct     .  Lumbar spinal stenosis  Comments: I discussed the case in detail with the patient and the staff. I do not see any clear-cut evidence of large vessel occlusion. She is not a candidate for IV TPA because of duration of symptoms. Her initial CT is negative. She is going for MRI of the head. We will also get an MRI of the head. Depending on the results of that we will plan further care. I also discussed the case with the attending physician.  Mechanism of Stroke: Not Clear  Metrics: Last Known Well: 02/17/2019 08:30:00 [facility] Notification Time: 02/17/2019 13:34:12 Stamp Time: 02/17/2019 13:34:12 Time First Login Attempt: 02/17/2019 13:36:14 Video Start Time: 02/17/2019 13:36:14  Symptoms: Left leg weakness NIHSS Start Assessment Time: 02/17/2019 13:40:32 Patient is not a candidate for tPA. Patient was not deemed candidate for tPA thrombolytics because of Last Well Known Above 4.5 Hours. Video End Time: 02/17/2019 13:49:39  CT head showed no acute hemorrhage or acute core infarct.  Clinical Presentation is not Suggestive of Large Vessel Occlusive Disease, Patient is not a Candidate for Thrombectomy  Our recommendations are outlined below.  Recommendations:     .  Activate Stroke Protocol Admission/Order Set     .  Stroke/Telemetry Floor     .  Neuro Checks     .  Bedside Swallow Eval     .  DVT Prophylaxis     .  IV Fluids, Normal Saline     .  Head of Bed Below 30 Degrees     .  Euglycemia and Avoid Hyperthermia (PRN Acetaminophen)     .  Antiplatelet Therapy Recommended  Routine Consultation with Inhouse Neurology for Follow up Care  Sign Out:     .  Discussed with Primary Attending    ------------------------------------------------------------------------------  History of Present Illness: Patient  is a 83 year old Female.  Inpatient stroke alert was called for symptoms of Left leg weakness  83 year old female admitted to acute care for generalized weakness and difficulty walking. Is awaiting placement. Was fine this am around 830 but later on found by PT with difficulty walking. Stroke alert was called  CT head showed no acute hemorrhage or acute core infarct.  Last seen normal was beyond 4.5 hours of presentation. There is no history of hemorrhagic complications or intracranial hemorrhage. There is no history of Recent Anticoagulants. There is no history of recent major surgery. There is no history of recent stroke.  Examination: 1A: Level of Consciousness - Alert; keenly responsive + 0 1B: Ask Month and Age - Both Questions Right + 0 1C: Blink Eyes & Squeeze Hands - Performs Both Tasks + 0 2: Test Horizontal Extraocular Movements - Normal + 0 3: Test Visual Fields - No Visual Loss + 0 4: Test Facial Palsy (Use Grimace if Obtunded) - Normal symmetry + 0 5A: Test Left Arm Motor Drift - No Drift for 10 Seconds + 0 5B: Test Right Arm Motor Drift - No Drift for 10 Seconds + 0 6A: Test Left Leg Motor Drift - Drift, hits bed + 2 6B: Test Right Leg Motor Drift - No Drift for 5 Seconds + 0 7: Test Limb Ataxia (FNF/Heel-Shin) - No Ataxia + 0 8: Test Sensation - Mild-Moderate Loss: Less Sharp/More Dull + 1 9: Test Language/Aphasia - Normal; No  aphasia + 0 10: Test Dysarthria - Normal + 0 11: Test Extinction/Inattention - No abnormality + 0  NIHSS Score: 3  Patient was informed the Neurology Consult would happen via TeleHealth consult by way of interactive audio and video telecommunications and consented to receiving care in this manner.  Due to the immediate potential for life-threatening deterioration due to underlying acute neurologic illness, I spent 35 minutes providing critical care. This time includes time for face to face visit via telemedicine, review of medical records,  imaging studies and discussion of findings with providers, the patient and/or family.   Dr Hedy Camara   [facility] [phone]  Case [id number]

## 2019-02-17 NOTE — Consult Note (Signed)
[facility] [name] [name] A. Gerilyn Pilgrim, MD     [email or web address]          [name] [name] is an 83 y.o. female.   ASSESSMENT/PLAN: 1. Subacute onset of left lower extremity weakness with a fluctuating course and gait impairment:  Imaging failed to show acute ischemic stroke or any lesion involving the brain.  She does have a severe stenosis of the right PCA which corresponds to the left-sided weakness.  It is possible that she could be having fluctuating weakness due to ischemia although out expect some type of changes on diffusion imaging of the brain. The patient examination is mostly nonlocalizing and there for the potential lesion is broad including cervical myelopathy, thoracic myelopathy, or radiculopathy in plexopathy. Given the patient's history of diabetes, she could have the rare type of asymmetric neuropathy known as diabetic amyotrophy although the patient does not have pain and this condition is almost always associated with severe pain.  I will initiate extensive workup given that her examination is nonlocalizing.  We will therefore do MRI of the entire spine.  Additional labs will also be obtained.  If the workup is unrevealing, we may treat her empirically for diabetic amyotrophy with the high-dose steroids for 3 days.  I agree that she should be treated with physical therapy.  I will also treat her with dual antiplatelet agents as she is noted to have severe intracranial stenosis of the PCA vessels especially on the right side corresponding to a current left-sided symptoms.    The patient is 83 year old right-handed white female who presents with the acute/ subacute onset of gait instability, falling and left leg weakness.  It appears that she has had a fluctuating course of weakness involving the left lower extremity.  She particularly had weakness on today which prompted the initiation of stroke code.  It appears that this morning she was doing fairly well and in the afternoon  she was noted to have more pronounced weakness.  Again, there seemed to be somewhat of a fluctuating course.  She does not report having pain of the left lower extremity however.  She has a longstanding history of diabetes.  She does have a longstanding history of chronic low back pain.  She tells me that this has not necessarily gotten worse.  She does not report other focal neurological symptoms such as dysarthria, dysphagia, headaches or dizziness.  The right-side is asymptomatic.  She denies any chest pain or shortness of breath but she does reported global fatigue and weakness.  The review of systems otherwise negative.    GENERAL: This is a obese female who appears to be in some discomfort but no acute distress.  HEENT:   Neck is supple no trauma appreciated.  ABDOMEN: soft  EXTREMITIES: No edema; there is marked arthritic changes bilaterally   BACK: This is normal.  SKIN: Normal by inspection.    MENTAL STATUS: Alert and oriented. Speech, language and cognition are generally intact. Judgment and insight normal.   CRANIAL NERVES: Pupils are equal, round and reactive to light and accomodation; extra ocular movements are full, there is no significant nystagmus; visual fields are full; upper and lower facial muscles are normal in strength and symmetric, there is no flattening of the nasolabial folds; tongue is midline; uvula is midline; shoulder elevation is normal.  MOTOR: Normal tone, bulk and strength - R; no pronator drift.  The left upper extremity is normal.  The the left leg shows significant weakness proximally with hip flexion  graded as 3/5 in dorsiflexion 2-3/5.  Bulk and tone are normal however.  COORDINATION: Left finger to nose is normal, right finger to nose is normal, No rest tremor; no intention tremor; no postural tremor; no bradykinesia.  REFLEXES: Deep tendon reflexes are symmetrical and normal. Plantar reflexes are flexor bilaterally.   SENSATION: Normal to light  touch.           PRIOR NEURO NOTES   05-2018 1.  Resolving encephalopathy due to acute urinary tract infection: Continue with current care. 2.  Resolving gait impairment also due to acute urinary tract infection: Continue with current care as above.  Also with need physical therapy. 3.  Remote lacunar infarcts: Continue with aspirin 325. 4.  Extensive chronic ischemic white matter changes:   Patient is a 83 year old white female who presents with the relative acute onset of altered mental status, confusion, gait instability and ataxia.  The work-up has been significant for UTI.  The patient has had multiple focal neurological deficit in the past as outlined below.  These have been associated with UTI although some have been associated with small vessel lacunar infarcts.  The patient reports that she still does not feel well although she has improved since being hospitalized overnight.  She reports still having residual gait instability and fatigue.  She has been able to feed herself after mproved overnight.  She has been given antibiotics and IV hydration.  She still reports having significant gait instability.  She does not report focal neurological deficits such as dysarthria, dysphagia, numbness or weakness.  She reports having some chronic mild gait problems and the pain, numbness and itching of the feet to the legs which she attributes to diabetes/diabetic neuropathy.  The review of systems is otherwise negative.           PRIOR NEURO NOTE 1. Likely tiny subacute infarct involving the pontine region on the left side. Risk factors hypertension, diabetes, previous infarcts, dyslipidemia, obesity and age. The infarct is lacunar type infarct and I would recommend continuing with antiplatelet agent aspirin. Continue with risk factor modification.  2. Recurrent UTI with some evidence of confusion likely due to the UTI.  3. Increased risk of risk cognitive impairment due  to recurrent ischemic events and the significant increased white matter disease seen on imaging. Additional labs will be obtained such as thyroid function test and vitamin B12 level.    The patient is 83 year old white female who has had recurrent hospitalizations for focal neurological symptoms lately left-sided weakness and confusion mostly due to urinary tract infection. The patient presented to the hospital with acute onset of right facial numbness. She reports she has had the symptoms for about a day. The patient denies symptoms involving the right leg or right upper extremity. She denies dysarthria or dysphasia. Appears that she may have had some confusion which initially presented. Workup has been significant for a tiny infarct seen on imaging. She also has a UTI. The patient has had an infarct in the past. This is a distinct infarct at least once but she's had recurrent left-sided symptoms in the setting of UTI with negative imaging for repeat infarct. The patient is on aspirin to full dose since she reports being compliant with this. Patient denies any symptoms of the left upper extremity or left leg. She does have episodic chest pain but not during this hospitalization a before presented to the hospital. She denies any loss of consciousness or alteration of consciousness. Review of systems is essentially  unrevealing other than as stated above.       Blood pressure (!) 111/40, pulse 63, temperature 98.3 F (36.8 C), temperature source Oral, resp. rate 18, height  (1.626 m), weight 91.6 kg, SpO2 100 %.  Past Medical History:  Diagnosis Date  . Arthritis   . CAD (coronary artery disease)    Multivessel status post CABG 2001  . Chronic obstructive pulmonary disease (HCC)   . Chronic pain   . DDD (degenerative disc disease), lumbar   . Diastolic CHF (HCC)   . Essential hypertension   . Hematuria   . History of CVA (cerebrovascular accident)    2013 - lacunar infarct right  thalamus - residual left side of  lip numb  and left eye vision worse (which corrented lens implant from cataract extraction)  . History of MI (myocardial infarction)    July 2001  . Hyperlipidemia   . Hypothyroidism   . Insulin dependent type 2 diabetes mellitus (HCC)   . Lesion of bladder   . Lower urinary tract symptoms (LUTS)   . OSA (obstructive sleep apnea)   . Peripheral neuropathy   . Psoriasis   . SUI (stress urinary incontinence, female)   . Varicose veins     Past Surgical History:  Procedure Laterality Date  . APPENDECTOMY  age 23  . CARDIAC CATHETERIZATION  1991   No sig. cad  . CARDIAC CATHETERIZATION  07-01-2000   high grade in-stent restenosis  . CARDIAC CATHETERIZATION  04-30-2001   single native vessel cad/ graft patent  . CARDIAC CATHETERIZATION  02-13-2006  dr [place] cad with total occluded LAD  and  70% ostial of small ramus/  grafts patent/  normal ef  . CARDIAC CATHETERIZATION  01-20-2000  dr Clifton James   essentially unchanged;  severe single vessel cad with diffuse disease throughout CFX and RCA/ ef 66%;  chf with preserved LVSF  . CARDIOVASCULAR STRESS TEST  08-28-2011   DR ROTHBART   NEGATIVE NUCLEAR STUDY/  GLOBAL LVSF/  EF 65%  . CARPAL TUNNEL RELEASE Bilateral yrs ago  . CATARACT EXTRACTION W/ INTRAOCULAR LENS  IMPLANT, BILATERAL  2013  . CHOLECYSTECTOMY  1990  . COLONOSCOPY N/A 07/26/2015   Procedure: COLONOSCOPY;  Surgeon: Malissa Hippo, MD;  Location: [facility];  Service: Endoscopy;  Laterality: N/A;  1200  . CORONARY ANGIOPLASTY  11-13-1999   balloon angioplasty to mLAD , d2 of LAD  . CORONARY ANGIOPLASTY WITH STENT PLACEMENT  01-17-2000   PCI stenting to mLAD & D2 of LAD  . CORONARY ARTERY BYPASS GRAFT  07-02-2000   DR Tressie Stalker   SVG to diagonal, LIMA to LAD  . CYSTOSCOPY W/ RETROGRADES Bilateral 08/09/2014   Procedure: CYSTOSCOPY, BILATERAL RETROGRADE, HYDRODISTENSION, BLADDER BIOPSY WITH FULGERATION, INSTILL PYRIDIUM AND MARCAINE  ;  Surgeon: Anner Crete, MD;  Location: [facility];  Service: Urology;  Laterality: Bilateral;  . KNEE ARTHROSCOPY Left 10-18-2003   SYNOVECTOMY  . LUMBAR SPINE SURGERY  x2    yrs ago  . ORIF HUMERUS FRACTURE Right 04-18-2008  . REPAIR FLEXOR TENDON HAND Right 09-20-2008   CARPI RADIALIS TENDON TRANSFER TO EXTENSOR TO INDEX, MIDDLE, RING , LITTLE FINGERS AND DIGITI MININI  . ROTATOR CUFF REPAIR Right 1995  . TOTAL ABDOMINAL HYSTERECTOMY W/ BILATERAL SALPINGOOPHORECTOMY  age 75  . TRANSTHORACIC ECHOCARDIOGRAM  12-12-2013   MILD LVH/  EF 60-65%/  GRADE I DIASTOLIC DYSFUNCTION/  MILD LAE    Family History  Problem Relation Age of Onset  . Hypertension Mother   . Stroke Mother   . Coronary artery disease Other        Multiple first and second-degree relatives  . Aneurysm Other        Cerebral circulation  . Colon cancer Sister   . Stroke Sister   . Coronary artery disease Sister   . Clotting disorder Other        Children diagnosed with hypercoagulable state  . Coronary artery disease Brother     Social History:  reports that she has never smoked. She has never used smokeless tobacco. She reports that she does not drink alcohol or use drugs.  Allergies:  Allergies  Allergen Reactions  . Codeine Itching    Medications: Prior to Admission medications   Medication Sig Start Date End Date Taking? Authorizing Provider  amLODipine (NORVASC) 5 MG tablet Take 5 mg by mouth daily. 07/03/17  Yes [provider]  aspirin EC 81 MG tablet Take 1 tablet (81 mg total) by mouth daily. 11/03/18  Yes Rhetta Mura, MD  benzonatate (TESSALON) 200 MG capsule Take 1 capsule (200 mg total) by mouth 3 (three) times daily as needed for cough. 01/14/19  Yes Pollina, Canary Brim, MD  cephALEXin (KEFLEX) 500 MG capsule Take 1 capsule (500 mg total) by mouth 4 (four) times daily. 02/08/19  Yes Samuel Jester, DO  citalopram (CELEXA) 20 MG tablet Take 1 tablet (20 mg  total) by mouth daily. 09/21/18  Yes [name], Gerlene Burdock, MD  gabapentin (NEURONTIN) 300 MG capsule Take 300 mg by mouth 2 (two) times daily. 09/18/17  Yes [provider]  insulin NPH-regular Human (70-30) 100 UNIT/ML injection Inject 45 Units into the skin 2 (two) times daily. 12/17/18  Yes Shah, Pratik D, DO  ipratropium-albuterol (DUONEB) 0.5-2.5 (3) MG/3ML SOLN Inhale 3 mLs into the lungs 4 (four) times daily as needed (for shortness of breath).  09/19/17  Yes [provider]  levothyroxine (SYNTHROID, LEVOTHROID) 25 MCG tablet Take 25 mcg by mouth daily.    Yes [provider]  lisinopril (PRINIVIL,ZESTRIL) 10 MG tablet Take 10 mg by mouth daily.  01/14/19  Yes [provider]  LORazepam (ATIVAN) 1 MG tablet Take 1 tablet (1 mg total) by mouth at bedtime. 09/20/18  Yes Oval Linsey, MD  metFORMIN (GLUCOPHAGE) 500 MG tablet Take 500 mg by mouth 2 (two) times daily with a meal.     Yes [provider]  nitroGLYCERIN (NITROSTAT) 0.4 MG SL tablet Place 0.4 mg under the tongue every 5 (five) minutes as needed for chest pain.    Yes [provider]  oxyCODONE (OXY IR/ROXICODONE) 5 MG immediate release tablet Take 1 tablet (5 mg total) by mouth every 4 (four) hours as needed (Moderate pain). 06/12/18  Yes [name], Gerlene Burdock, MD  simvastatin (ZOCOR) 20 MG tablet Take 20 mg by mouth daily. 12/03/18  Yes [provider]    Scheduled Meds: . amLODipine  5 mg Oral Daily  . aspirin EC  81 mg Oral Daily  . citalopram  20 mg Oral Daily  . dextrose  25 mL Intravenous Once  . enoxaparin (LOVENOX) injection  40 mg Subcutaneous Q24H  . fluconazole  200 mg Oral Daily  . gabapentin  300 mg Oral BID  . insulin aspart  0-9 Units Subcutaneous TID WC  . insulin aspart protamine- aspart  30 Units Subcutaneous BID WC  . levothyroxine  25 mcg Oral QAC breakfast  . LORazepam  1 mg Oral QHS  . pantoprazole  40 mg Oral Q0600  . simvastatin  20 mg Oral q1800    Continuous Infusions: . sodium chloride 30 mL/hr at 02/15/19 1708  . sodium chloride 100 mL/hr at 02/17/19 1737   PRN Meds:.acetaminophen **OR** acetaminophen, ipratropium-albuterol, nitroGLYCERIN, ondansetron **OR** ondansetron (ZOFRAN) IV, oxyCODONE, senna-docusate, traZODone     Results for orders placed or performed during the hospital encounter of 02/12/19 (from the past 48 hour(s))  Glucose, capillary     Status: Abnormal   Collection Time: 02/15/19 10:07 PM  Result Value Ref Range   Glucose-Capillary 111 (H) 70 - 99 mg/dL  Glucose, capillary     Status: Abnormal   Collection Time: 02/16/19  2:57 AM  Result Value Ref Range   Glucose-Capillary 109 (H) 70 - 99 mg/dL   Comment 1 Notify RN    Comment 2 Document in Chart   Glucose, capillary     Status: Abnormal   Collection Time: 02/16/19  7:27 AM  Result Value Ref Range   Glucose-Capillary 114 (H) 70 - 99 mg/dL  Glucose, capillary     Status: Abnormal   Collection Time: 02/16/19 11:02 AM  Result Value Ref Range   Glucose-Capillary 277 (H) 70 - 99 mg/dL  Glucose, capillary     Status: Abnormal   Collection Time: 02/16/19  4:03 PM  Result Value Ref Range   Glucose-Capillary 139 (H) 70 - 99 mg/dL  Glucose, capillary     Status: Abnormal   Collection Time: 02/16/19  9:59 PM  Result Value Ref Range   Glucose-Capillary 234 (H) 70 - 99 mg/dL  Glucose, capillary     Status: Abnormal   Collection Time: 02/17/19  3:52 AM  Result Value Ref Range   Glucose-Capillary 150 (H) 70 - 99 mg/dL   Comment 1 Notify RN    Comment 2 Document in Chart   Glucose, capillary     Status: Abnormal   Collection Time: 02/17/19  7:58 AM  Result Value Ref Range   Glucose-Capillary 154 (H) 70 - 99 mg/dL  Glucose, capillary     Status: Abnormal   Collection Time: 02/17/19 11:48 AM  Result Value Ref Range   Glucose-Capillary 301 (H) 70 - 99 mg/dL  Comprehensive metabolic panel     Status: Abnormal   Collection Time: 02/17/19  1:13 PM  Result  Value Ref Range   Sodium 136 135 - 145 mmol/L   Potassium 4.1 3.5 - 5.1 mmol/L   Chloride 103 98 - 111 mmol/L   CO2 26 22 - 32 mmol/L   Glucose, Bld 213 (H) 70 - 99 mg/dL   BUN 12 8 - 23 mg/dL   Creatinine, Ser 4.28 0.44 - 1.00 mg/dL   Calcium 9.1 8.9 - 76.8 mg/dL   Total Protein 6.4 (L) 6.5 - 8.1 g/dL   Albumin 3.4 (L) 3.5 - 5.0 g/dL   AST 16 15 - 41 U/L   ALT 18 0 - 44 U/L   Alkaline Phosphatase 64 38 - 126 U/L   Total Bilirubin 0.4 0.3 - 1.2 mg/dL   GFR calc non Af Amer >60 >60 mL/min   GFR calc Af Amer >60 >60 mL/min   Anion gap 7 5 - 15    Comment: Performed at [facility], [address], [place], [place] [address]  CBC     Status: Abnormal   Collection Time: 02/17/19  1:13 PM  Result Value Ref Range   WBC 9.9 4.0 - 10.5 K/uL  RBC 5.00 3.87 - 5.11 MIL/uL   Hemoglobin 11.7 (L) 12.0 - 15.0 g/dL   HCT 16.1 09.6 - 04.5 %   MCV 81.2 80.0 - 100.0 fL   MCH 23.4 (L) 26.0 - 34.0 pg   MCHC 28.8 (L) 30.0 - 36.0 g/dL   RDW 40.9 (H) 81.1 - 91.4 %   Platelets 308 150 - 400 K/uL   nRBC 0.0 0.0 - 0.2 %    Comment: Performed at [facility], [address] [place], [place] [address]  Glucose, capillary     Status: None   Collection Time: 02/17/19  5:13 PM  Result Value Ref Range   Glucose-Capillary 80 70 - 99 mg/dL    Studies/Results:     The brain MRI is reviewed in person.  No acute findings are seen on DWI.  No hemorrhages seen on SWI.  There is marked global atrophy.  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  There is evidence of lacunar infarct involving the right basal ganglia.  MRA shows evidence of disease involving the PCA vessels bilaterally especially on the right.  Findings are essentially unchanged from her previous scans.   Britainy Kozub A. Gerilyn Pilgrim, M.D.  Diplomate, Biomedical engineer of Psychiatry and Neurology ( Neurology). 02/17/2019, 7:33 PM

## 2019-02-17 NOTE — Progress Notes (Signed)
Patient complains that she "hasn't felt right all morning" and that "something is wrong with my brain". Patient has trouble lifting her left leg and complains that the sensation is not equal. Patient shows no other signs of deficits. Patient has been alert and oriented eating lunch in the bed. When all of this occurred therapy was working with her. During my assessment at [phone] this morning the patient did not present with these issues and only complained on chronic back pain that she was treated with PRN medication. Dr. Kerry Hough was notified of her change in status and has been in the room to assess her at this time.

## 2019-02-17 NOTE — Progress Notes (Signed)
PROGRESS NOTE  [name] [name]  EXB:[id number]  DOB: [birth year]  DOA: 02/12/2019 PCP: Oval Linsey, MD  Brief Admission Hx: 83 y.o. female with a complex past medical history especially detailed below but significant for COPD, hypertension, diabetes mellitus hypothyroidism coronary artery disease and multiple hospitalizations and ED visits over recent years presents to the emergency department after she had been seen several days ago reporting that she fell at home this morning.  MDM/Assessment & Plan:   1. Left lower extremity weakness, concerning for acute CVA.  Initially, it was unclear regarding onset of symptoms and last known normal time.  Nursing staff reports that she was noted to have equal strength bilaterally this morning during their assessment at 8:39am.  Will activate code stroke with stat CT scan and tele neurology consultation.  I have also discussed the case with Dr. Gerilyn Pilgrim who will see the patient in-house.  She is already on aspirin.  Complete further stroke work-up. 2. Acute generalized weakness- this was present on admission and initially improved with IV fluids.  3. Type 2 diabetes mellitus, insulin requiring with neurological complications- monitor blood glucose closely as she has had some hypoglycemia.  Monitor blood glucose 5 times per day.  Sliding scale coverage ordered.  4. Leukocytosis- suspect secondary to yeast UTI, check blood cultures for candidemia. WBC trended down with treatment. 5. Symptomatic Yeast UTI-patient is symptomatic from this and we will treat.  Fluconazole oral tablets x14 days ordered.  Pyridium ordered for dysuria.  Recommend urology follow up outpatient.  She has been to [facility] in the past.   6. AKI-RESOLVED likely prerenal and treated with IV hydration. 7. Fall at home-fall precautions recommended and PT recommending SNF.  Pt now agreeable.  8. Hypothyroidism- TSH stable.  9. Anemia of chronic disease- stable. 10. Chronic  gait instability- fall precautions and PT recommending SNF. 11. OSA-will offer nightly CPAP. 12. Essential hypertension- stable BP.  13. Chronic pain and opioid dependence - resumed home meds.  Patient had a negative urine drug screen on 02/08/2019.  DVT Prophylaxis: Lovenox Code Status: Full Family Communication: Patient updated at bedside Disposition Plan: INP, awaiting SNF placement  Consultants:  PT and social work  Procedures:    Antimicrobials:  Fluconazole 2/21 >> (14 day course)  Subjective: Informed by staff that patient reports left lower extremity weakness.  This was also noted by physical therapy when they were working with her at approximately 11:45am today.  Physical therapist reported that he noted worsening left lower extremity weakness which was not present several days ago when he last worked with her.  Patient reports that she is unsure regarding the onset of her symptoms and felt it may have started yesterday.  Nursing staff reports that they noted equal strength bilaterally in her lower extremities this morning at 8:39 during her assessment.  Objective: Vitals:   02/16/19 2032 02/16/19 2156 02/17/19 0706 02/17/19 1302  BP:  (!) 155/65 (!) 155/66 (!) 145/64  Pulse:  81 63 76  Resp:  20 20 (!) 22  Temp:  98 F (36.7 C) 98.1 F (36.7 C) 98.5 F (36.9 C)  TempSrc:  Oral Oral Oral  SpO2: 92% 95% 91% 95%  Weight:      Height:        Intake/Output Summary (Last 24 hours) at 02/17/2019 1305 Last data filed at 02/17/2019 1231 Gross per 24 hour  Intake 840 ml  Output 1150 ml  Net -310 ml   Filed Weights   02/12/19 1027  02/12/19 1447 02/15/19 0543  Weight: 85.7 kg 90.3 kg 91.6 kg   REVIEW OF SYSTEMS  As per history otherwise all reviewed and reported negative  Exam:  General exam: Alert, awake, oriented x 3 Respiratory system: Clear to auscultation. Respiratory effort normal. Cardiovascular system:RRR. No murmurs, rubs, gallops. Gastrointestinal  system: Abdomen is nondistended, soft and nontender. No organomegaly or masses felt. Normal bowel sounds heard. Central nervous system: Alert and oriented. Left lower extremity 2 out of 5, right lower extremity 4/5, upper extremities are equal bilaterally.  She has some difficulty opening her right eyelid. Extremities: No edema bilaterally Skin: No rashes, lesions or ulcers Psychiatry: Judgement and insight appear normal. Mood & affect appropriate.    Data Reviewed: Basic Metabolic Panel: Recent Labs  Lab 02/12/19 0859 02/13/19 0645  NA 142 137  K 4.2 4.2  CL 108 104  CO2 25 25  GLUCOSE 64* 211*  BUN 20 12  CREATININE 1.15* 0.72  CALCIUM 9.4 8.9  MG  --  1.8   Liver Function Tests: Recent Labs  Lab 02/12/19 0859 02/13/19 0645  AST 14* 12*  ALT 13 11  ALKPHOS 60 58  BILITOT 0.3 0.6  PROT 6.7 6.0*  ALBUMIN 3.7 3.2*   No results for input(s): LIPASE, AMYLASE in the last 168 hours. No results for input(s): AMMONIA in the last 168 hours. CBC: Recent Labs  Lab 02/12/19 0859 02/13/19 0645  WBC 14.0* 11.4*  NEUTROABS 10.8* 7.8*  HGB 12.6 11.5*  HCT 45.2 39.9  MCV 83.5 81.6  PLT 326 276   Cardiac Enzymes: Recent Labs  Lab 02/12/19 0859  TROPONINI <0.03   CBG (last 3)  Recent Labs    02/17/19 0352 02/17/19 0758 02/17/19 1148  GLUCAP 150* 154* 301*   Recent Results (from the past 240 hour(s))  Urine culture     Status: Abnormal   Collection Time: 02/08/19  5:39 PM  Result Value Ref Range Status   Specimen Description   Final    URINE, CLEAN CATCH Performed at [facility], [address], [place], [place] [address]    Special Requests   Final    NONE Performed at [facility], [address], [place], [place] [address]    Culture (A)  Final    <10,000 COLONIES/mL INSIGNIFICANT GROWTH Performed at [facility], [address], [place], [place] [address]    Report Status 02/10/2019 FINAL  Final  Culture, blood (Routine X 2) w Reflex to ID Panel      Status: None   Collection Time: 02/12/19  3:02 PM  Result Value Ref Range Status   Specimen Description BLOOD LEFT ANTECUBITAL  Final   Special Requests   Final    BOTTLES DRAWN AEROBIC AND ANAEROBIC Blood Culture adequate volume   Culture   Final    NO GROWTH 5 DAYS Performed at [facility], [address] [place], [place] [address]    Report Status 02/17/2019 FINAL  Final  Culture, blood (Routine X 2) w Reflex to ID Panel     Status: None   Collection Time: 02/12/19  3:03 PM  Result Value Ref Range Status   Specimen Description BLOOD BLOOD LEFT HAND  Final   Special Requests   Final    BOTTLES DRAWN AEROBIC AND ANAEROBIC Blood Culture adequate volume   Culture   Final    NO GROWTH 5 DAYS Performed at [facility], [address] [place], [place] [address]    Report Status 02/17/2019  FINAL  Final  Urine Culture     Status: None   Collection Time: 02/12/19 11:45 PM  Result Value Ref Range Status   Specimen Description   Final    URINE, CLEAN CATCH Performed at [facility], [address] [place], [place] [address]    Special Requests   Final    NONE Performed at [facility], [address] [place], [place] [address]    Culture   Final    NO GROWTH Performed at [facility], [address] [place], [place] [address]    Report Status 02/14/2019 FINAL  Final     Studies: No results found. Scheduled Meds: .  stroke: mapping our early stages of recovery book   Does not apply Once  . amLODipine  5 mg Oral Daily  . aspirin EC  81 mg Oral Daily  . citalopram  20 mg Oral Daily  . dextrose  25 mL Intravenous Once  . enoxaparin (LOVENOX) injection  40 mg Subcutaneous Q24H  . fluconazole  200 mg Oral Daily  . gabapentin  300 mg Oral BID  . insulin aspart  0-9 Units Subcutaneous TID WC  . insulin aspart protamine- aspart  30 Units Subcutaneous BID WC  . levothyroxine  25 mcg Oral QAC breakfast  . LORazepam  1 mg Oral QHS  . pantoprazole  40 mg Oral Q0600  .  simvastatin  20 mg Oral q1800   Continuous Infusions: . sodium chloride 30 mL/hr at 02/15/19 1708  . sodium chloride      Active Problems:   Hypothyroidism   DM type 2 causing complication (HCC)   Hyperlipidemia   Obesity   Essential hypertension   Arteriosclerotic cardiovascular disease (ASCVD)   Sleep apnea   Chronic obstructive pulmonary disease (HCC)   Noncompliance   Noncompliance with CPAP treatment   Gait instability   Anemia of chronic disease   Hyperglycemia   Generalized weakness   Fall at home   Symptomatic Yeast UTI   Leukocytosis   AKI (acute kidney injury) (HCC)  Time spent: 30mins  Erick BlinksJehanzeb Memon, MD [facility] 02/17/2019, 1:05 PM    LOS: 4 days

## 2019-02-17 NOTE — Progress Notes (Signed)
Physical Therapy Treatment Patient Details Name: [name] [name] MRN: [id number] DOB: 28-Dec-1935 Today's Date: 02/17/2019    History of Present Illness [name] [name] is a 83 y.o. female with a complex past medical history especially detailed below but significant for COPD, hypertension, diabetes mellitus hypothyroidism coronary artery disease and multiple hospitalizations and ED visits over recent years presents to the emergency department after she had been seen several days ago reporting that she fell at home this morning.  She says that she has been generally weak and has difficulty standing.  She feels that her UTI is getting worse.  She was seen several days ago and diagnosed with a UTI and treated with ceftriaxone and discharged home on cephalexin.  She reports that she has been taking the medications as prescribed.  The urine culture did not have any significant bacterial growth.  She denies having chest pain and shortness of breath.  She denies having any significant trauma from the fall.  She apparently laid herself down when she started feeling weak.  She did not hit her head.  She was not down on the ground for prolonged period of time.  She does have poorly controlled diabetes mellitus.  She says that her blood glucose was tested by EMS and she was not hypoglycemic at the time of falling out.  She does have a history of having hypoglycemia from insulin.  She takes 70/30 insulin twice daily.  She also has a history of bradycardia.  She has had multiple syncopal episodes in the past that have been worked up during previous admissions.  She denies abdominal pain and headaches.  She reports that she has been having some dizziness as well.  Her main complaint is weakness in the lower extremities.  No vision changes.  She denies having weakness in the extremities.  The patient on had a an MRI of the brain done several days ago when she was seen in the ED that did not show any findings of acute CVA.   She had chronic microvascular changes reported from that study.    PT Comments    Patient presents asleep in supine and agreeable to therapy. Pt requires mod assist to come to EOB with trunk uprighting, no leaning noted once seated EOB. Pt completes sit to stand and stand pivot transfers using RW and mod assist to rise from EOB and chair with assistance to manage RW, physically advance L foot, and verbal cues for sequencing. Pt able to take labored side, forward and retro steps at bedside completing 5 ft with mod assist due to LLE weakness. Pt reports "new" L side weakness and denies previous difficulty with transfers or gait prior to today. Pt goes on to report "I haven't felt myself all day" but unable to elaborate when asked and complains of "brain discomfort" but denies headache. PT able to palpate dorsiflexion and plantarflexion, but no active movement noted. Nursing notified of pt's complaints. While RN in room, pt complains of difficulty reading and continues to be unable to report what is not normal about the way she feels. Pt later reports "I don't know" when asked if she normally has this much difficulty with bed mobility, transfers and gait when RN and PT in room. Pt denies difficulty swallowing and without difficulty speaking. Pt returned to supine in bed, HOB elevated, call bell in hand and MD notified of pt's new complaints. Pt limited today secondary to new weakness complaints and fatigue with mobility. Pt will benefit from continued  physical therapy in hospital and recommended venue below to increase strength, balance, endurance for safe ADLs and gait.    Follow Up Recommendations  SNF     Equipment Recommendations  None recommended by PT    Recommendations for Other Services       Precautions / Restrictions Precautions Precautions: Fall Restrictions Weight Bearing Restrictions: No    Mobility  Bed Mobility Overal bed mobility: Needs Assistance Bed Mobility: Supine to  Sit;Sit to Supine     Supine to sit: Mod assist Sit to supine: Mod assist   General bed mobility comments: increased time, assistance for trunk uprighting for supine to sit and assistance for BLE lifting for sit to supine  Transfers Overall transfer level: Needs assistance Equipment used: Rolling walker (2 wheeled) Transfers: Sit to/from [employer] Sit to Stand: Mod assist Stand pivot transfers: Mod assist       General transfer comment: increased time, unsteadiness upon standing, constant verbal cues for sequencing, min assist for foot placement with pivoting  Ambulation/Gait Ambulation/Gait assistance: Min assist;Mod assist Gait Distance (Feet): 5 Feet Assistive device: Rolling walker (2 wheeled) Gait Pattern/deviations: Step-to pattern;Decreased step length - right;Decreased step length - left;Decreased stride length;Shuffle Gait velocity: decreased   General Gait Details: labored steps at bedside with decreased B step length using shuffling pattern, increased difficulty advancing L foot and decreased L single limb stance duration, min assist to manage RW    Stairs             Wheelchair Mobility    Modified Rankin (Stroke Patients Only)       Balance Overall balance assessment: Needs assistance Sitting-balance support: Feet supported Sitting balance-Leahy Scale: Fair Sitting balance - Comments: seated EOB, no leaning noted   Standing balance support: Bilateral upper extremity supported;During functional activity Standing balance-Leahy Scale: Poor Standing balance comment: with RW                            Cognition Arousal/Alertness: Awake/alert Behavior During Therapy: WFL for tasks assessed/performed Overall Cognitive Status: Impaired/Different from baseline Area of Impairment: Problem solving;Safety/judgement                         Safety/Judgement: Decreased awareness of safety   Problem Solving: Slow  processing General Comments: Pt able to follow commands with increased verbal cues, slow processing requiring repeating of cues, impulsive requiring cues for safety      Exercises General Exercises - Lower Extremity Long Arc Quad: Seated;AROM;Strengthening;Both;10 reps Hip Flexion/Marching: Seated;AROM;Strengthening;Both;10 reps Toe Raises: Seated;AROM;Strengthening;Right;10 reps(L dorsiflexion palpated, but no movement) Heel Raises: Seated;AROM;Strengthening;Right;10 reps(L plantarflexion palpated, but no movement)    General Comments        Pertinent Vitals/Pain Pain Assessment: 0-10 Pain Score: 9  Pain Location: chronic low back, LLE, L shoulder Pain Descriptors / Indicators: Aching Pain Intervention(s): Limited activity within patient's tolerance;Monitored during session;Repositioned    Home Living                      Prior Function            PT Goals (current goals can now be found in the care plan section) Acute Rehab PT Goals Patient Stated Goal: return home with family to assist PT Goal Formulation: With patient Time For Goal Achievement: 02/27/19 Potential to Achieve Goals: Good Progress towards PT goals: Progressing toward goals    Frequency  Min 3X/week      PT Plan Current plan remains appropriate    Co-evaluation              AM-PAC PT "6 Clicks" Mobility   Outcome Measure  Help needed turning from your back to your side while in a flat bed without using bedrails?: A Little Help needed moving from lying on your back to sitting on the side of a flat bed without using bedrails?: A Lot Help needed moving to and from a bed to a chair (including a wheelchair)?: A Lot Help needed standing up from a chair using your arms (e.g., wheelchair or bedside chair)?: A Lot Help needed to walk in hospital room?: A Lot Help needed climbing 3-5 steps with a railing? : Total 6 Click Score: 12    End of Session Equipment Utilized During  Treatment: Gait belt Activity Tolerance: Patient tolerated treatment well;Patient limited by fatigue Patient left: in bed;with call bell/phone within reach Nurse Communication: Mobility status PT Visit Diagnosis: Unsteadiness on feet (R26.81);Muscle weakness (generalized) (M62.81);Other abnormalities of gait and mobility (R26.89)     Time: 1130-1210 PT Time Calculation (min) (ACUTE ONLY): 40 min  Charges:  $Gait Training: 8-22 mins $Therapeutic Activity: 8-22 mins                     12:33 PM, 02/17/19 Domenick Bookbinder, DPT Physical Therapist with [facility] [phone] office

## 2019-02-17 NOTE — Progress Notes (Signed)
Patient was transported back from MRI and [place] via stretcher. Patient is in bed and has family at bedside. Kimmie with [facility]eneuro was called and notified that MRI results were back.

## 2019-02-17 NOTE — Progress Notes (Signed)
SLP Cancellation Note  Patient Details Name: [name] [name] MRN: [id number] DOB: [birth year]   Cancelled treatment:       Reason Eval/Treat Not Completed: Patient at procedure or test/unavailable; SLP received consult for cognitive linguistic evaluation, however Pt currently off floor due to code stroke called for Pt. SLP will follow.  Thank you,  Havery Moros, CCC-SLP [phone]    Sherrine Salberg 02/17/2019, 1:15 PM

## 2019-02-18 ENCOUNTER — Inpatient Hospital Stay (HOSPITAL_COMMUNITY): Payer: Medicare HMO

## 2019-02-18 DIAGNOSIS — Y92009 Unspecified place in unspecified non-institutional (private) residence as the place of occurrence of the external cause: Secondary | ICD-10-CM

## 2019-02-18 DIAGNOSIS — W19XXXD Unspecified fall, subsequent encounter: Secondary | ICD-10-CM

## 2019-02-18 LAB — LIPID PANEL
Cholesterol: 166 mg/dL (ref 0–200)
HDL: 42 mg/dL (ref >40)
LDL Cholesterol: 106 mg/dL — ABNORMAL HIGH (ref 0–99)
Total CHOL/HDL Ratio: 4 RATIO
Triglycerides: 90 mg/dL (ref <150)
VLDL: 18 mg/dL (ref 0–40)

## 2019-02-18 LAB — GLUCOSE, CAPILLARY
GLUCOSE-CAPILLARY: 150 mg/dL — AB (ref 70–99)
Glucose-Capillary: 135 mg/dL — ABNORMAL HIGH (ref 70–99)
Glucose-Capillary: 164 mg/dL — ABNORMAL HIGH (ref 70–99)
Glucose-Capillary: 108 mg/dL — ABNORMAL HIGH (ref 70–99)
Glucose-Capillary: 89 mg/dL (ref 70–99)

## 2019-02-18 LAB — HEMOGLOBIN A1C
Hgb A1c MFr Bld: 8.5 % — ABNORMAL HIGH (ref 4.8–5.6)
Mean Plasma Glucose: 197.25 mg/dL

## 2019-02-18 MED ORDER — SODIUM CHLORIDE 0.9 % IV SOLN
500.0000 mg | Freq: Every day | INTRAVENOUS | Status: DC
  Administered 2019-02-18: 500 mg via INTRAVENOUS
  Filled 2019-02-18 (×3): qty 4

## 2019-02-18 MED ORDER — GADOBUTROL 1 MMOL/ML IV SOLN
10.0000 mL | Freq: Once | INTRAVENOUS | Status: AC | PRN
  Administered 2019-02-18: 10 mL via INTRAVENOUS

## 2019-02-18 NOTE — Progress Notes (Signed)
OT Cancellation Note  Patient Details Name: [name] [name] MRN: [id number] DOB: 01-08-1936   Cancelled Treatment:    Reason Eval/Treat Not Completed: Patient at procedure or test/ unavailable. Pt off floor for testing. Will check back at a later time.   Ezra Sites, OTR/L  [phone] 02/18/2019, 7:58 AM

## 2019-02-18 NOTE — Progress Notes (Signed)
PROGRESS NOTE  [name] [name]  TDS:[id number]  DOB: [birth year]  DOA: 02/12/2019 PCP: Oval Linsey, MD  Brief Admission Hx: 83 y.o. female with a complex past medical history especially detailed below but significant for COPD, hypertension, diabetes mellitus hypothyroidism coronary artery disease and multiple hospitalizations and ED visits over recent years presents to the emergency department after she had been seen several days ago reporting that she fell at home this morning.  MDM/Assessment & Plan:   1. Left lower extremity weakness.  Imaging did not reveal any underlying CVA.  She was seen by neurology and has undergone imaging of her cervical, thoracic and lumbar spine.  Await further recommendations.  Differential diagnosis includes diabetic amyotrophy.  She may require 3 days of IV steroids.  She is also being treated with dual antiplatelet therapy for severe intracranial stenosis.  Await further recommendations from neurology. 2. Acute generalized weakness- this was present on admission and initially improved with IV fluids.  3. Type 2 diabetes mellitus, insulin requiring with neurological complications- monitor blood glucose closely as she has had some hypoglycemia.  Monitor blood glucose 5 times per day.  Sliding scale coverage ordered.  4. Leukocytosis- suspect secondary to yeast UTI, check blood cultures for candidemia. WBC trended down with treatment. 5. Symptomatic Yeast UTI-patient is symptomatic from this and we will treat.  Fluconazole oral tablets x14 days ordered.  Pyridium ordered for dysuria.  Recommend urology follow up outpatient.  She has been to [facility] in the past.   6. AKI-RESOLVED likely prerenal and treated with IV hydration. 7. Fall at home-fall precautions recommended and PT recommending SNF.  Pt now agreeable.  8. Hypothyroidism- TSH stable.  9. Anemia of chronic disease- stable. 10. Chronic gait instability- fall precautions and PT recommending  SNF. 11. OSA-will offer nightly CPAP. 12. Essential hypertension- stable BP.  13. Chronic pain and opioid dependence - resumed home meds.  Patient had a negative urine drug screen on 02/08/2019.  DVT Prophylaxis: Lovenox Code Status: Full Family Communication: Patient updated at bedside Disposition Plan: INP, awaiting SNF placement  Consultants:  PT and social work  Neurology  Procedures:    Antimicrobials:  Fluconazole 2/21 >> (14 day course)  Subjective: Feels that her left leg is still weak, although mildly better than yesterday.  Objective: Vitals:   02/18/19 1102 02/18/19 1502 02/18/19 1902 02/18/19 1930  BP: (!) 150/77 (!) 156/68 (!) 151/63 (!) 159/63  Pulse: (!) 58 66 63 63  Resp: 18 18 18 18   Temp: 98.2 F (36.8 C) 98.3 F (36.8 C) 98.5 F (36.9 C) 98.6 F (37 C)  TempSrc: Oral Oral Oral Oral  SpO2: 96% 96% 94% 95%  Weight:      Height:        Intake/Output Summary (Last 24 hours) at 02/18/2019 1954 Last data filed at 02/18/2019 2620 Gross per 24 hour  Intake 1334.07 ml  Output 300 ml  Net 1034.07 ml   Filed Weights   02/12/19 0811 02/12/19 1447 02/15/19 0543  Weight: 85.7 kg 90.3 kg 91.6 kg   REVIEW OF SYSTEMS  As per history otherwise all reviewed and reported negative  Exam:  General exam: Alert, awake, oriented x 3 Respiratory system: Clear to auscultation. Respiratory effort normal. Cardiovascular system:RRR. No murmurs, rubs, gallops. Gastrointestinal system: Abdomen is nondistended, soft and nontender. No organomegaly or masses felt. Normal bowel sounds heard. Central nervous system: Alert and oriented.  Weakness in left lower extremity appears to be improving today.  Dorsiflexion on left  side is still weak.. Extremities: No C/C/E, +pedal pulses Skin: No rashes, lesions or ulcers Psychiatry: Judgement and insight appear normal. Mood & affect appropriate.      Data Reviewed: Basic Metabolic Panel: Recent Labs  Lab 02/12/19 0859  02/13/19 0645 02/17/19 1313  NA 142 137 136  K 4.2 4.2 4.1  CL 108 104 103  CO2 GLUCOSE 64* 211* 213*  BUN CREATININE 1.15* 0.72 0.88  CALCIUM 9.4 8.9 9.1  MG  --  1.8  --    Liver Function Tests: Recent Labs  Lab 02/12/19 0859 02/13/19 0645 02/17/19 1313  AST 14* 12* 16  ALT ALKPHOS 60 58 64  BILITOT 0.3 0.6 0.4  PROT 6.7 6.0* 6.4*  ALBUMIN 3.7 3.2* 3.4*   No results for input(s): LIPASE, AMYLASE in the last 168 hours. No results for input(s): AMMONIA in the last 168 hours. CBC: Recent Labs  Lab 02/12/19 0859 02/13/19 0645 02/17/19 1313  WBC 14.0* 11.4* 9.9  NEUTROABS 10.8* 7.8*  --   HGB 12.6 11.5* 11.7*  HCT 45.2 39.9 40.6  MCV 83.5 81.6 81.2  PLT 326 276 308   Cardiac Enzymes: Recent Labs  Lab 02/12/19 0859  TROPONINI <0.03   CBG (last 3)  Recent Labs    02/18/19 0856 02/18/19 1206 02/18/19 1609  GLUCAP 164* 150* 108*   Recent Results (from the past 240 hour(s))  Culture, blood (Routine X 2) w Reflex to ID Panel     Status: None   Collection Time: 02/12/19  3:02 PM  Result Value Ref Range Status   Specimen Description BLOOD LEFT ANTECUBITAL  Final   Special Requests   Final    BOTTLES DRAWN AEROBIC AND ANAEROBIC Blood Culture adequate volume   Culture   Final    NO GROWTH 5 DAYS Performed at [facility], [address], [place], [place] [address]    Report Status 02/17/2019 FINAL  Final  Culture, blood (Routine X 2) w Reflex to ID Panel     Status: None   Collection Time: 02/12/19  3:03 PM  Result Value Ref Range Status   Specimen Description BLOOD BLOOD LEFT HAND  Final   Special Requests   Final    BOTTLES DRAWN AEROBIC AND ANAEROBIC Blood Culture adequate volume   Culture   Final    NO GROWTH 5 DAYS Performed at [facility], [address], [place], [place] [address]    Report Status 02/17/2019 FINAL  Final  Urine Culture     Status: None   Collection Time: 02/12/19 11:45 PM  Result Value Ref Range  Status   Specimen Description   Final    URINE, CLEAN CATCH Performed at [facility], [address] [place], [place] [address]    Special Requests   Final    NONE Performed at [facility], [address], [place], [place] [address]    Culture   Final    NO GROWTH Performed at [facility], [address], [place], [place] [address]    Report Status 02/14/2019 FINAL  Final     Studies: [name] Contrast  Result Date: 02/17/2019 CLINICAL DATA:  Left-sided weakness and fall. EXAM: MRI HEAD WITHOUT CONTRAST MRA HEAD WITHOUT CONTRAST TECHNIQUE: Multiplanar, multiecho pulse sequences of the brain and surrounding structures were obtained without intravenous contrast. Angiographic images of the head were obtained using MRA technique without contrast. COMPARISON:  MRA brain  11/02/2018 Head CT 02/17/2019 Brain MRI 02/08/2019 FINDINGS: MRI HEAD FINDINGS BRAIN: There is no acute infarct, acute hemorrhage or mass effect. The midline structures are normal. Old right thalamic lacunar infarct. Early confluent hyperintense T2-weighted signal of the periventricular and deep white matter, most commonly due to chronic ischemic microangiopathy. Generalized atrophy without lobar predilection. Susceptibility-sensitive sequences show no chronic microhemorrhage or superficial siderosis. SKULL AND UPPER CERVICAL SPINE: The visualized skull base, calvarium, upper cervical spine and extracranial soft tissues are normal. SINUSES/ORBITS: No fluid levels or advanced mucosal thickening. No mastoid or middle ear effusion. The orbits are normal. MRA HEAD FINDINGS POSTERIOR CIRCULATION: --Basilar artery: Normal. --Posterior cerebral arteries: Multifocal severe stenosis of both P2 segments. The right PCA is predominantly supplied by the posterior communicating artery. --Superior cerebellar arteries: Normal. --Inferior cerebellar arteries: Normal anterior and posterior inferior cerebellar arteries. ANTERIOR CIRCULATION:  --Intracranial internal carotid arteries: Normal. --Anterior cerebral arteries: Normal. Hypoplastic right A1 segment, normal variant. --Middle cerebral arteries: There are multiple moderate-to-severe stenoses of the proximal right M2 branches. There is severe stenosis of the distal left M1 and proximal M2 branches. The left M1 stenosis is new compared to 11/02/18. --Posterior communicating arteries: Right p-comm gives rise to the right PCA. No left p-comm is visualized. IMPRESSION: 1. No acute ischemic infarct. 2. Chronic ischemic microangiopathy and generalized atrophy. 3. New, severe stenosis of the distal left M1 MCA segment. Unchanged bilateral MCA M2 range moderate-to-severe stenoses. 4. Unchanged multifocal severe stenoses of both posterior cerebral artery P2 segments. Electronically Signed   By: Deatra Robinson M.D.   On: 02/17/2019 15:38   Mr Cervical Spine Wo Contrast  Result Date: 02/18/2019 CLINICAL DATA:  Back pain and left lower extremity weakness over the last week. EXAM: MRI CERVICAL, THORACIC AND LUMBAR SPINE WITHOUT AND WITH CONTRAST TECHNIQUE: Multiplanar and multiecho pulse sequences of the cervical spine, to include the craniocervical junction and cervicothoracic junction, and thoracic and lumbar spine, were obtained without and with intravenous contrast. COMPARISON:  CT 02/12/2019.  Lumbar MRI 12/21/2018 CONTRAST:  10 cc Gadavist FINDINGS: MRI CERVICAL SPINE FINDINGS Alignment: Normal Vertebrae: No fracture or primary bone lesion. Cord: No cord compression or primary cord lesion. Posterior Fossa, vertebral arteries, paraspinal tissues: Negative Disc levels: No abnormality at C1-2, C2-3 or C3-4. C4-5: Right paracentral disc herniation with slight upward migration indents the ventral subarachnoid space but does not compress the cord or show foraminal extension. C5-6: Endplate osteophytes and bulging of the disc more prominent towards the right. No compressive canal stenosis. Right foraminal  narrowing of a moderate degree. C6-7: Bulging of the disc and bilateral uncovertebral prominence. No compressive central canal stenosis. Mild bilateral foraminal narrowing. C7-T1: Bulging of the disc.  No canal or foraminal stenosis. MRI THORACIC SPINE FINDINGS Alignment:  No significant malalignment. Vertebrae: No fracture or primary bone lesion. Cord:  No primary cord lesion.  See below regarding stenosis. Paraspinal and other soft tissues: Negative Disc levels: T2-3: Mild noncompressive disc bulge. T4-5: Mild noncompressive disc bulge. T5-6: Shallow left posterolateral herniation without visible neural compression T6-7: Shallow central disc herniation without visible neural compression. T7-8: Central to left-sided disc herniation, contacting the left side of the cord. This could possibly be symptomatic. T8-9: Mild noncompressive disc bulge. T9-10: Mild noncompressive disc bulge. T10-11 through T12-L1: Mild noncompressive disc bulges. MRI LUMBAR SPINE FINDINGS Segmentation:  5 lumbar type vertebral bodies. Alignment: Straightening of the normal lumbar lordosis. Mild curvature convex to the left. Vertebrae: No fracture or primary bone lesion. Discogenic endplate marrow changes  on the right at L3-4 and L4-5. Conus medullaris and cauda equina: Conus extends to the L1 level. Conus and cauda equina appear normal. Paraspinal and other soft tissues: Negative Disc levels: L1-2: Previous left hemilaminectomy. Broad-based disc herniation more prominent in the left posterolateral direction. This indents the thecal sac but does not cause visible neural compression. L2-3: Previous left hemilaminectomy. Endplate osteophytes and bulging of the disc. No apparent compressive stenosis. Foraminal to extraforaminal encroachment on the right by osteophyte in disc does not appear to compress the L2 nerve, but could irritate that structure. L3-4: Endplate osteophytes and bulging of the disc. Mild facet and ligamentous hypertrophy. Mild  stenosis of the lateral recesses and foramina but without definite neural compression. L4-5: Endplate osteophytes and broad-based protrusion of the disc. Mild facet and ligamentous hypertrophy. Stenosis of the lateral recesses and neural foramina that could cause neural compression on either or both sides. L5-S1: Mild bulging of the disc.  No canal or foraminal stenosis. IMPRESSION: Cervical region: No cord compression or primary cord lesion. Right paracentral disc herniation at C4-5 with slight upward migration, not apparently resulting in neural compression. Spondylosis at C5-6 and C6-7 with moderate right foraminal narrowing at C5-6 and mild bilateral foraminal narrowing at C6-7. Thoracic region: No fracture. No primary cord lesion. Degenerative spondylosis at multiple levels without apparent neural compression. Left posterolateral disc herniation at T7-8 which does contact the left side of the cord and could be symptomatic. Ample subarachnoid spaces present dorsal to cord however. Lumbar region: No change appreciated since December. Previous left-sided decompression at L1-2 and L2-3. No ongoing left-sided neural compression visible at those levels. L3-4: Mild stenosis of the lateral recesses and foramina but without definite neural compression. L4-5: Bilateral lateral recess and foraminal stenosis because of encroachment by osteophyte in disc material. Neural compression could occur on either side at this level. Electronically Signed   By: Paulina Fusi M.D.   On: 02/18/2019 09:44   Mr Thoracic Spine Wo Contrast  Result Date: 02/18/2019 CLINICAL DATA:  Back pain and left lower extremity weakness over the last week. EXAM: MRI CERVICAL, THORACIC AND LUMBAR SPINE WITHOUT AND WITH CONTRAST TECHNIQUE: Multiplanar and multiecho pulse sequences of the cervical spine, to include the craniocervical junction and cervicothoracic junction, and thoracic and lumbar spine, were obtained without and with intravenous  contrast. COMPARISON:  CT 02/12/2019.  Lumbar MRI 12/21/2018 CONTRAST:  10 cc Gadavist FINDINGS: MRI CERVICAL SPINE FINDINGS Alignment: Normal Vertebrae: No fracture or primary bone lesion. Cord: No cord compression or primary cord lesion. Posterior Fossa, vertebral arteries, paraspinal tissues: Negative Disc levels: No abnormality at C1-2, C2-3 or C3-4. C4-5: Right paracentral disc herniation with slight upward migration indents the ventral subarachnoid space but does not compress the cord or show foraminal extension. C5-6: Endplate osteophytes and bulging of the disc more prominent towards the right. No compressive canal stenosis. Right foraminal narrowing of a moderate degree. C6-7: Bulging of the disc and bilateral uncovertebral prominence. No compressive central canal stenosis. Mild bilateral foraminal narrowing. C7-T1: Bulging of the disc.  No canal or foraminal stenosis. MRI THORACIC SPINE FINDINGS Alignment:  No significant malalignment. Vertebrae: No fracture or primary bone lesion. Cord:  No primary cord lesion.  See below regarding stenosis. Paraspinal and other soft tissues: Negative Disc levels: T2-3: Mild noncompressive disc bulge. T4-5: Mild noncompressive disc bulge. T5-6: Shallow left posterolateral herniation without visible neural compression T6-7: Shallow central disc herniation without visible neural compression. T7-8: Central to left-sided disc herniation, contacting the left  side of the cord. This could possibly be symptomatic. T8-9: Mild noncompressive disc bulge. T9-10: Mild noncompressive disc bulge. T10-11 through T12-L1: Mild noncompressive disc bulges. MRI LUMBAR SPINE FINDINGS Segmentation:  5 lumbar type vertebral bodies. Alignment: Straightening of the normal lumbar lordosis. Mild curvature convex to the left. Vertebrae: No fracture or primary bone lesion. Discogenic endplate marrow changes on the right at L3-4 and L4-5. Conus medullaris and cauda equina: Conus extends to the L1  level. Conus and cauda equina appear normal. Paraspinal and other soft tissues: Negative Disc levels: L1-2: Previous left hemilaminectomy. Broad-based disc herniation more prominent in the left posterolateral direction. This indents the thecal sac but does not cause visible neural compression. L2-3: Previous left hemilaminectomy. Endplate osteophytes and bulging of the disc. No apparent compressive stenosis. Foraminal to extraforaminal encroachment on the right by osteophyte in disc does not appear to compress the L2 nerve, but could irritate that structure. L3-4: Endplate osteophytes and bulging of the disc. Mild facet and ligamentous hypertrophy. Mild stenosis of the lateral recesses and foramina but without definite neural compression. L4-5: Endplate osteophytes and broad-based protrusion of the disc. Mild facet and ligamentous hypertrophy. Stenosis of the lateral recesses and neural foramina that could cause neural compression on either or both sides. L5-S1: Mild bulging of the disc.  No canal or foraminal stenosis. IMPRESSION: Cervical region: No cord compression or primary cord lesion. Right paracentral disc herniation at C4-5 with slight upward migration, not apparently resulting in neural compression. Spondylosis at C5-6 and C6-7 with moderate right foraminal narrowing at C5-6 and mild bilateral foraminal narrowing at C6-7. Thoracic region: No fracture. No primary cord lesion. Degenerative spondylosis at multiple levels without apparent neural compression. Left posterolateral disc herniation at T7-8 which does contact the left side of the cord and could be symptomatic. Ample subarachnoid spaces present dorsal to cord however. Lumbar region: No change appreciated since December. Previous left-sided decompression at L1-2 and L2-3. No ongoing left-sided neural compression visible at those levels. L3-4: Mild stenosis of the lateral recesses and foramina but without definite neural compression. L4-5: Bilateral  lateral recess and foraminal stenosis because of encroachment by osteophyte in disc material. Neural compression could occur on either side at this level. Electronically Signed   By: Paulina Fusi M.D.   On: 02/18/2019 09:44   Mr Lumbar Spine W Wo Contrast  Result Date: 02/18/2019 CLINICAL DATA:  Back pain and left lower extremity weakness over the last week. EXAM: MRI CERVICAL, THORACIC AND LUMBAR SPINE WITHOUT AND WITH CONTRAST TECHNIQUE: Multiplanar and multiecho pulse sequences of the cervical spine, to include the craniocervical junction and cervicothoracic junction, and thoracic and lumbar spine, were obtained without and with intravenous contrast. COMPARISON:  CT 02/12/2019.  Lumbar MRI 12/21/2018 CONTRAST:  10 cc Gadavist FINDINGS: MRI CERVICAL SPINE FINDINGS Alignment: Normal Vertebrae: No fracture or primary bone lesion. Cord: No cord compression or primary cord lesion. Posterior Fossa, vertebral arteries, paraspinal tissues: Negative Disc levels: No abnormality at C1-2, C2-3 or C3-4. C4-5: Right paracentral disc herniation with slight upward migration indents the ventral subarachnoid space but does not compress the cord or show foraminal extension. C5-6: Endplate osteophytes and bulging of the disc more prominent towards the right. No compressive canal stenosis. Right foraminal narrowing of a moderate degree. C6-7: Bulging of the disc and bilateral uncovertebral prominence. No compressive central canal stenosis. Mild bilateral foraminal narrowing. C7-T1: Bulging of the disc.  No canal or foraminal stenosis. MRI THORACIC SPINE FINDINGS Alignment:  No significant malalignment. Vertebrae:  No fracture or primary bone lesion. Cord:  No primary cord lesion.  See below regarding stenosis. Paraspinal and other soft tissues: Negative Disc levels: T2-3: Mild noncompressive disc bulge. T4-5: Mild noncompressive disc bulge. T5-6: Shallow left posterolateral herniation without visible neural compression T6-7:  Shallow central disc herniation without visible neural compression. T7-8: Central to left-sided disc herniation, contacting the left side of the cord. This could possibly be symptomatic. T8-9: Mild noncompressive disc bulge. T9-10: Mild noncompressive disc bulge. T10-11 through T12-L1: Mild noncompressive disc bulges. MRI LUMBAR SPINE FINDINGS Segmentation:  5 lumbar type vertebral bodies. Alignment: Straightening of the normal lumbar lordosis. Mild curvature convex to the left. Vertebrae: No fracture or primary bone lesion. Discogenic endplate marrow changes on the right at L3-4 and L4-5. Conus medullaris and cauda equina: Conus extends to the L1 level. Conus and cauda equina appear normal. Paraspinal and other soft tissues: Negative Disc levels: L1-2: Previous left hemilaminectomy. Broad-based disc herniation more prominent in the left posterolateral direction. This indents the thecal sac but does not cause visible neural compression. L2-3: Previous left hemilaminectomy. Endplate osteophytes and bulging of the disc. No apparent compressive stenosis. Foraminal to extraforaminal encroachment on the right by osteophyte in disc does not appear to compress the L2 nerve, but could irritate that structure. L3-4: Endplate osteophytes and bulging of the disc. Mild facet and ligamentous hypertrophy. Mild stenosis of the lateral recesses and foramina but without definite neural compression. L4-5: Endplate osteophytes and broad-based protrusion of the disc. Mild facet and ligamentous hypertrophy. Stenosis of the lateral recesses and neural foramina that could cause neural compression on either or both sides. L5-S1: Mild bulging of the disc.  No canal or foraminal stenosis. IMPRESSION: Cervical region: No cord compression or primary cord lesion. Right paracentral disc herniation at C4-5 with slight upward migration, not apparently resulting in neural compression. Spondylosis at C5-6 and C6-7 with moderate right foraminal  narrowing at C5-6 and mild bilateral foraminal narrowing at C6-7. Thoracic region: No fracture. No primary cord lesion. Degenerative spondylosis at multiple levels without apparent neural compression. Left posterolateral disc herniation at T7-8 which does contact the left side of the cord and could be symptomatic. Ample subarachnoid spaces present dorsal to cord however. Lumbar region: No change appreciated since December. Previous left-sided decompression at L1-2 and L2-3. No ongoing left-sided neural compression visible at those levels. L3-4: Mild stenosis of the lateral recesses and foramina but without definite neural compression. L4-5: Bilateral lateral recess and foraminal stenosis because of encroachment by osteophyte in disc material. Neural compression could occur on either side at this level. Electronically Signed   By: Paulina Fusi M.D.   On: 02/18/2019 09:44   US Carotid Bilateral (at [facility] Only)  Result Date: 02/17/2019 CLINICAL DATA:  Stroke for 12 hours EXAM: BILATERAL CAROTID DUPLEX ULTRASOUND TECHNIQUE: Wallace Cullens scale imaging, color Doppler and duplex ultrasound were performed of bilateral carotid and vertebral arteries in the neck. COMPARISON:  08/02/2016 FINDINGS: Criteria: Quantification of carotid stenosis is based on velocity parameters that correlate the residual internal carotid diameter with NASCET-based stenosis levels, using the diameter of the distal internal carotid lumen as the denominator for stenosis measurement. The following velocity measurements were obtained: RIGHT ICA: 101 cm/sec CCA: 138 cm/sec SYSTOLIC ICA/CCA RATIO:  0.7 ECA: 120 cm/sec LEFT ICA: 72 cm/sec CCA: 67 cm/sec SYSTOLIC ICA/CCA RATIO:  1.1 ECA: 141 cm/sec RIGHT CAROTID ARTERY: Mild irregular calcified plaque in the bulb. Low resistance internal carotid Doppler pattern is preserved. RIGHT VERTEBRAL ARTERY:  Antegrade. LEFT CAROTID ARTERY: Moderate predominately smooth calcified plaque in the bulb. Low resistance  internal carotid Doppler pattern is preserved. LEFT VERTEBRAL ARTERY:  Antegrade. IMPRESSION: Less than 50% stenosis in the right and left internal carotid arteries. Antegrade vertebral arteries bilaterally. Electronically Signed   By: Jolaine ClickArthur  Hoss M.D.   On: 02/17/2019 16:01   Mr Maxine GlennMra Head Wo Contrast  Result Date: 02/17/2019 CLINICAL DATA:  Left-sided weakness and fall. EXAM: MRI HEAD WITHOUT CONTRAST MRA HEAD WITHOUT CONTRAST TECHNIQUE: Multiplanar, multiecho pulse sequences of the brain and surrounding structures were obtained without intravenous contrast. Angiographic images of the head were obtained using MRA technique without contrast. COMPARISON:  MRA brain 11/02/2018 Head CT 02/17/2019 Brain MRI 02/08/2019 FINDINGS: MRI HEAD FINDINGS BRAIN: There is no acute infarct, acute hemorrhage or mass effect. The midline structures are normal. Old right thalamic lacunar infarct. Early confluent hyperintense T2-weighted signal of the periventricular and deep white matter, most commonly due to chronic ischemic microangiopathy. Generalized atrophy without lobar predilection. Susceptibility-sensitive sequences show no chronic microhemorrhage or superficial siderosis. SKULL AND UPPER CERVICAL SPINE: The visualized skull base, calvarium, upper cervical spine and extracranial soft tissues are normal. SINUSES/ORBITS: No fluid levels or advanced mucosal thickening. No mastoid or middle ear effusion. The orbits are normal. MRA HEAD FINDINGS POSTERIOR CIRCULATION: --Basilar artery: Normal. --Posterior cerebral arteries: Multifocal severe stenosis of both P2 segments. The right PCA is predominantly supplied by the posterior communicating artery. --Superior cerebellar arteries: Normal. --Inferior cerebellar arteries: Normal anterior and posterior inferior cerebellar arteries. ANTERIOR CIRCULATION: --Intracranial internal carotid arteries: Normal. --Anterior cerebral arteries: Normal. Hypoplastic right A1 segment, normal  variant. --Middle cerebral arteries: There are multiple moderate-to-severe stenoses of the proximal right M2 branches. There is severe stenosis of the distal left M1 and proximal M2 branches. The left M1 stenosis is new compared to 11/02/18. --Posterior communicating arteries: Right p-comm gives rise to the right PCA. No left p-comm is visualized. IMPRESSION: 1. No acute ischemic infarct. 2. Chronic ischemic microangiopathy and generalized atrophy. 3. New, severe stenosis of the distal left M1 MCA segment. Unchanged bilateral MCA M2 range moderate-to-severe stenoses. 4. Unchanged multifocal severe stenoses of both posterior cerebral artery P2 segments. Electronically Signed   By: Deatra RobinsonKevin  Herman M.D.   On: 02/17/2019 15:38   Ct Head Code Stroke Wo Contrast  Result Date: 02/17/2019 CLINICAL DATA:  Code stroke. Fall today. New onset left lower extremity weakness. EXAM: CT HEAD WITHOUT CONTRAST TECHNIQUE: Contiguous axial images were obtained from the base of the skull through the vertex without intravenous contrast. COMPARISON:  CT head without contrast 02/12/2019. MRI brain 02/08/2019 FINDINGS: Brain: Moderate atrophy and white matter disease is stable. Basal ganglia are intact. Insular ribbon is normal bilaterally. No acute or focal cortical abnormalities are present. The ventricles are of proportionate to the degree of atrophy. No significant extraaxial fluid collection is present. The brainstem and cerebellum are within normal limits. Vascular: Atherosclerotic calcifications are again noted within the cavernous internal carotid arteries. There is no hyperdense vessel. Skull: Calvarium is intact. No focal lytic or blastic lesions are present. Sinuses/Orbits: Chronic right sphenoid sinus disease is present. The paranasal sinuses are otherwise clear. Bilateral lens replacements are noted. Globes and orbits are otherwise unremarkable. ASPECTS [facility]([place] Stroke Program Early CT Score) - Ganglionic level infarction  (caudate, lentiform nuclei, internal capsule, insula, M1-M3 cortex): 7/7 - Supraganglionic infarction (M4-M6 cortex): 3/3 Total score (0-10 with 10 being normal): 10/10 IMPRESSION: 1. No acute intracranial abnormality or significant interval change. 2. Stable moderate  atrophy and white matter disease. 3. ASPECTS is 10/10 These results were called by telephone at the time of interpretation on 02/17/2019 at 1:28 pm to Dr. Durward Mallard [facility] , who verbally acknowledged these results. Electronically Signed   By: Marin Roberts M.D.   On: 02/17/2019 13:28   Scheduled Meds: . amLODipine  5 mg Oral Daily  . aspirin EC  81 mg Oral Daily  . citalopram  20 mg Oral Daily  . dextrose  25 mL Intravenous Once  . enoxaparin (LOVENOX) injection  40 mg Subcutaneous Q24H  . fluconazole  200 mg Oral Daily  . gabapentin  300 mg Oral BID  . insulin aspart  0-9 Units Subcutaneous TID WC  . insulin aspart protamine- aspart  30 Units Subcutaneous BID WC  . levothyroxine  25 mcg Oral QAC breakfast  . LORazepam  1 mg Oral QHS  . pantoprazole  40 mg Oral Q0600  . simvastatin  20 mg Oral q1800   Continuous Infusions: . sodium chloride 30 mL/hr at 02/15/19 1708  . sodium chloride 100 mL/hr at 02/18/19 0321    Active Problems:   Hypothyroidism   DM type 2 causing complication (HCC)   Hyperlipidemia   Obesity   Essential hypertension   Arteriosclerotic cardiovascular disease (ASCVD)   Sleep apnea   Chronic obstructive pulmonary disease (HCC)   Noncompliance   Noncompliance with CPAP treatment   Gait instability   Anemia of chronic disease   Hyperglycemia   Generalized weakness   Fall at home   Symptomatic Yeast UTI   Leukocytosis   AKI (acute kidney injury) (HCC)  Time spent:  Erick Blinks, MD [facility] 02/18/2019, 7:54 PM    LOS: 5 days

## 2019-02-18 NOTE — Progress Notes (Signed)
Physical Therapy Treatment Patient Details Name: [name] [name] MRN: [id number] DOB: Oct 04, 1936 Today's Date: 02/18/2019    History of Present Illness [name] [name] is a 83 y.o. female with a complex past medical history especially detailed below but significant for COPD, hypertension, diabetes mellitus hypothyroidism coronary artery disease and multiple hospitalizations and ED visits over recent years presents to the emergency department after she had been seen several days ago reporting that she fell at home this morning.  She says that she has been generally weak and has difficulty standing.  She feels that her UTI is getting worse.  She was seen several days ago and diagnosed with a UTI and treated with ceftriaxone and discharged home on cephalexin.  She reports that she has been taking the medications as prescribed.  The urine culture did not have any significant bacterial growth.  She denies having chest pain and shortness of breath.  She denies having any significant trauma from the fall.  She apparently laid herself down when she started feeling weak.  She did not hit her head.  She was not down on the ground for prolonged period of time.  She does have poorly controlled diabetes mellitus.  She says that her blood glucose was tested by EMS and she was not hypoglycemic at the time of falling out.  She does have a history of having hypoglycemia from insulin.  She takes 70/30 insulin twice daily.  She also has a history of bradycardia.  She has had multiple syncopal episodes in the past that have been worked up during previous admissions.  She denies abdominal pain and headaches.  She reports that she has been having some dizziness as well.  Her main complaint is weakness in the lower extremities.  No vision changes.  She denies having weakness in the extremities.  The patient on had a an MRI of the brain done several days ago when she was seen in the ED that did not show any findings of acute CVA.   She had chronic microvascular changes reported from that study.    PT Comments    Patient presents supine in bed and agreeable to therapy. Pt without complaints of L sided weakness today and reports only chronic low back pain present. Pt continues to require mod assist and increased time with bed mobility. Pt able to ambulate 20 ft using RW with mod assist, demonstrating difficulty advancing L foot sliding it forward and requiring multiple attempts to progress, unsteadiness with 2 turns causing loss of balance requiring max assist to prevent fall, and mod assist to maintain RW at appropriate distance. Pt requires verbal cues 75% of the treatment to improve safety and technique. Pt alert/oriented 90% of treatment, but unable to understand verbal cues and questioning where she is 10% of the treatment requiring reorientation. Pt continues to be limited by weakness and poor safety awareness. Patient will benefit from continued physical therapy in hospital and recommended venue below to increase strength, balance, endurance for safe ADLs and gait.    Follow Up Recommendations  SNF     Equipment Recommendations  None recommended by PT    Recommendations for Other Services       Precautions / Restrictions Precautions Precautions: Fall Restrictions Weight Bearing Restrictions: No    Mobility  Bed Mobility Overal bed mobility: Needs Assistance Bed Mobility: Supine to Sit;Sit to Supine     Supine to sit: Mod assist Sit to supine: Mod assist   General bed mobility comments: increased  time, labored movement, assistance with trunk uprighting and BLE lifting back into bed  Transfers Overall transfer level: Needs assistance Equipment used: Rolling walker (2 wheeled) Transfers: Sit to/from [employer] CorporationStand;Stand Pivot Transfers Sit to Stand: Mod assist Stand pivot transfers: Mod assist       General transfer comment: increased time, unsteadiness upon standing, constant verbal cues for sequencing, min  assist for foot placement with pivoting  Ambulation/Gait Ambulation/Gait assistance: Mod assist Gait Distance (Feet): 20 Feet Assistive device: Rolling walker (2 wheeled) Gait Pattern/deviations: Step-to pattern;Decreased step length - right;Decreased step length - left;Decreased stride length;Shuffle Gait velocity: decreased   General Gait Details: decreased heel-toe pattern, difficulty advancing L foot using shuffling technique with multiple attempts to progress, mod assist for RW management, 2 loss of balance episodes requiring max assist to prevent fall   Stairs             Wheelchair Mobility    Modified Rankin (Stroke Patients Only)       Balance Overall balance assessment: Needs assistance Sitting-balance support: Feet supported;Bilateral upper extremity supported Sitting balance-Leahy Scale: Fair Sitting balance - Comments: seated EOB   Standing balance support: Bilateral upper extremity supported;During functional activity Standing balance-Leahy Scale: Poor Standing balance comment: with RW                            Cognition Arousal/Alertness: Awake/alert Behavior During Therapy: WFL for tasks assessed/performed Overall Cognitive Status: Within Functional Limits for tasks assessed                                 General Comments: Pt awake/alert 90% of the treatment with few moments of unclarity with pt asking "where am I?" and unable to follow one step commands      Exercises      General Comments        Pertinent Vitals/Pain Pain Score: 8  Pain Location: chronic low back Pain Descriptors / Indicators: Aching Pain Intervention(s): Limited activity within patient's tolerance;Monitored during session;Patient requesting pain meds-RN notified    Home Living                      Prior Function            PT Goals (current goals can now be found in the care plan section) Acute Rehab PT Goals Patient Stated  Goal: return home with family to assist PT Goal Formulation: With patient Time For Goal Achievement: 02/27/19 Potential to Achieve Goals: Good Progress towards PT goals: Progressing toward goals    Frequency    Min 3X/week      PT Plan Current plan remains appropriate    Co-evaluation              AM-PAC PT "6 Clicks" Mobility   Outcome Measure  Help needed turning from your back to your side while in a flat bed without using bedrails?: A Little Help needed moving from lying on your back to sitting on the side of a flat bed without using bedrails?: A Lot Help needed moving to and from a bed to a chair (including a wheelchair)?: A Lot Help needed standing up from a chair using your arms (e.g., wheelchair or bedside chair)?: A Lot Help needed to walk in hospital room?: A Lot Help needed climbing 3-5 steps with a railing? : Total 6 Click Score: 12  End of Session Equipment Utilized During Treatment: Gait belt Activity Tolerance: Patient tolerated treatment well;Patient limited by fatigue Patient left: in bed;with call bell/phone within reach;with bed alarm set Nurse Communication: Mobility status PT Visit Diagnosis: Unsteadiness on feet (R26.81);Muscle weakness (generalized) (M62.81);Other abnormalities of gait and mobility (R26.89)     Time: 1008-1030 PT Time Calculation (min) (ACUTE ONLY): 22 min  Charges:  $Gait Training: 8-22 mins                     12:28 PM, 02/18/19 Domenick Bookbinder, DPT Physical Therapist with [facility] [phone] office

## 2019-02-18 NOTE — Progress Notes (Signed)
[facility] [place] A. Gerilyn Pilgrim, MD     [email or web address]          [name] [name] is an 83 y.o. female.   Assessment/Plan: 1.  Subacute/acute weakness of the left lower extremity: Work-up so far has been negative for a myelopathy, lesion involving the brain and radiculopathy.  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  However, I think treatment is warranted. I will give the patient high-dose steroids for 3 days.   She reports that she continues to have left leg weakness.  She reports no improvement overnight.  No worsening is reported.  She continues to have chronic low back pain but no leg pain.   GENERAL: This is a obese female who appears to be in some discomfort but no acute distress.  HEENT:   Neck is supple no trauma appreciated.  EXTREMITIES: No edema; there is marked arthritic changes bilaterally   BACK: This is normal.  SKIN: Normal by inspection.    MENTAL STATUS: Alert and oriented. Speech, language and cognition are generally intact. Judgment and insight normal.   CRANIAL NERVES: Pupils are equal, round and reactive to light and accomodation; extra ocular movements are full, there is no significant nystagmus; visual fields are full; upper and lower facial muscles are normal in strength and symmetric, there is no flattening of the nasolabial folds; tongue is midline; uvula is midline; shoulder elevation is normal.  MOTOR: Normal tone, bulk and strength - RUE; no pronator drift.  Right hip flexion is 4+/5.  Right dorsiflexion 5/5.  The left upper extremity is normal.  The the left leg shows significant weakness proximally with hip flexion graded as 3/5 in dorsiflexion 0/5.  Bulk and tone are  normal however.  COORDINATION: Left finger to nose is normal, right finger to nose is normal, No rest tremor; no intention tremor; no postural tremor; no bradykinesia.  REFLEXES: Deep tendon reflexes are symmetrical and normal.   SENSATION: Normal to light touch.    Objective: Vital signs in last 24 hours: Temp:  [98 F (36.7 C)-98.7 F (37.1 C)] 98.6 F (37 C) (02/27 1930) Pulse Rate:  [58-85] 63 (02/27 1930) Resp:  [18-20] 18 (02/27 1930) BP: (125-159)/(52-84) 159/63 (02/27 1930) SpO2:  [93 %-96 %] 95 % (02/27 1930)  Intake/Output from previous day: 02/26 0701 - 02/27 0700 In: 1694.1 [P.O.:600; I.V.:1094.1] Out: 550 [Urine:550] Intake/Output this shift: No intake/output data recorded. Nutritional status:  Diet Order            Diet heart healthy/carb modified Room service appropriate? Yes; Fluid consistency: Thin  Diet effective now               Lab Results: Results for orders placed or performed during the hospital encounter of 02/12/19 (from the past 48 hour(s))  Glucose, capillary     Status: Abnormal   Collection Time: 02/16/19  9:59 PM  Result Value Ref Range   Glucose-Capillary 234 (H) 70 - 99 mg/dL  Glucose, capillary     Status: Abnormal   Collection Time: 02/17/19  3:52 AM  Result Value Ref Range   Glucose-Capillary 150 (H) 70 - 99 mg/dL   Comment 1 Notify RN    Comment 2 Document in Chart  Glucose, capillary     Status: Abnormal   Collection Time: 02/17/19  7:58 AM  Result Value Ref Range   Glucose-Capillary 154 (H) 70 - 99 mg/dL  Glucose, capillary     Status: Abnormal   Collection Time: 02/17/19 11:48 AM  Result Value Ref Range   Glucose-Capillary 301 (H) 70 - 99 mg/dL  Comprehensive metabolic panel     Status: Abnormal   Collection Time: 02/17/19  1:13 PM  Result Value Ref Range   Sodium 136 135 - 145 mmol/L   Potassium 4.1 3.5 - 5.1 mmol/L   Chloride 103 98 - 111 mmol/L   CO2 26 22 - 32 mmol/L   Glucose, Bld 213 (H) 70 - 99 mg/dL     BUN 12 8 - 23 mg/dL   Creatinine, Ser 0.09 0.44 - 1.00 mg/dL   Calcium 9.1 8.9 - 23.3 mg/dL   Total Protein 6.4 (L) 6.5 - 8.1 g/dL   Albumin 3.4 (L) 3.5 - 5.0 g/dL   AST 16 15 - 41 U/L   ALT 18 0 - 44 U/L   Alkaline Phosphatase 64 38 - 126 U/L   Total Bilirubin 0.4 0.3 - 1.2 mg/dL   GFR calc non Af Amer >60 >60 mL/min   GFR calc Af Amer >60 >60 mL/min   Anion gap 7 5 - 15    Comment: Performed at [facility] [address], [place], [place] [address]  CBC     Status: Abnormal   Collection Time: 02/17/19  1:13 PM  Result Value Ref Range   WBC 9.9 4.0 - 10.5 K/uL   RBC 5.00 3.87 - 5.11 MIL/uL   Hemoglobin 11.7 (L) 12.0 - 15.0 g/dL   HCT 26.3 33.5 - 45.6 %   MCV 81.2 80.0 - 100.0 fL   MCH 23.4 (L) 26.0 - 34.0 pg   MCHC 28.8 (L) 30.0 - 36.0 g/dL   RDW 25.6 (H) 38.9 - 37.3 %   Platelets 308 150 - 400 K/uL   nRBC 0.0 0.0 - 0.2 %    Comment: Performed at [facility], [address] [place], [place] [address]  Glucose, capillary     Status: None   Collection Time: 02/17/19  5:13 PM  Result Value Ref Range   Glucose-Capillary 80 70 - 99 mg/dL  Glucose, capillary     Status: Abnormal   Collection Time: 02/17/19  9:02 PM  Result Value Ref Range   Glucose-Capillary 238 (H) 70 - 99 mg/dL  Glucose, capillary     Status: Abnormal   Collection Time: 02/18/19  3:05 AM  Result Value Ref Range   Glucose-Capillary 135 (H) 70 - 99 mg/dL  Hemoglobin O1L     Status: Abnormal   Collection Time: 02/18/19  6:37 AM  Result Value Ref Range   Hgb A1c MFr Bld 8.5 (H) 4.8 - 5.6 %    Comment: (NOTE) Pre diabetes:          5.7%-6.4% Diabetes:              >6.4% Glycemic control for   <7.0% adults with diabetes    Mean Plasma Glucose 197.25 mg/dL    Comment: Performed at [facility], [address] [place], [place] [address]  Lipid panel     Status: Abnormal   Collection Time: 02/18/19  6:37 AM  Result Value Ref Range   Cholesterol 166 0 - 200 mg/dL   Triglycerides 90 <035 mg/dL    HDL 42 >  40 mg/dL   Total CHOL/HDL Ratio 4.0 RATIO   VLDL 18 0 - 40 mg/dL   LDL Cholesterol 161 (H) 0 - 99 mg/dL    Comment:        Total Cholesterol/HDL:CHD Risk Coronary Heart Disease Risk Table                     Men   Women  1/2 Average Risk   3.4   3.3  Average Risk       5.0   4.4  2 X Average Risk   9.6   7.1  3 X Average Risk  23.4   11.0        Use the calculated Patient Ratio above and the CHD Risk Table to determine the patient's CHD Risk.        ATP III CLASSIFICATION (LDL):  <100     mg/dL   Optimal  096-045  mg/dL   Near or Above                    Optimal  130-159  mg/dL   Borderline  409-811  mg/dL   High  >914     mg/dL   Very High Performed at [facility], [address] [place], [place] [address]   Glucose, capillary     Status: Abnormal   Collection Time: 02/18/19  8:56 AM  Result Value Ref Range   Glucose-Capillary 164 (H) 70 - 99 mg/dL  Glucose, capillary     Status: Abnormal   Collection Time: 02/18/19 12:06 PM  Result Value Ref Range   Glucose-Capillary 150 (H) 70 - 99 mg/dL  Glucose, capillary     Status: Abnormal   Collection Time: 02/18/19  4:09 PM  Result Value Ref Range   Glucose-Capillary 108 (H) 70 - 99 mg/dL   Comment 1 Notify RN    Comment 2 Document in Chart     Lipid Panel Recent Labs    02/18/19 0637  CHOL 166  TRIG 90  HDL 42  CHOLHDL 4.0  VLDL 18  LDLCALC 621*    Studies/Results: CERVICAL, THORACIC AND LUMBAR SPINE MRI:  IMPRESSION: Cervical region: No cord compression or primary cord lesion. Right paracentral disc herniation at C4-5 with slight upward migration, not apparently resulting in neural compression. Spondylosis at C5-6 and C6-7 with moderate right foraminal narrowing at C5-6 and mild bilateral foraminal narrowing at C6-7.  Thoracic region: No fracture. No primary cord lesion. Degenerative spondylosis at multiple levels without apparent neural compression. Left posterolateral disc herniation at T7-8  which does contact the left side of the cord and could be symptomatic. Ample subarachnoid spaces present dorsal to cord however.  Lumbar region: No change appreciated since December. Previous left-sided decompression at L1-2 and L2-3. No ongoing left-sided neural compression visible at those levels.  L3-4: Mild stenosis of the lateral recesses and foramina but without definite neural compression.  L4-5: Bilateral lateral recess and foraminal stenosis because of encroachment by osteophyte in disc material. Neural compression could occur on either side at this level.   The scans are reviewed and shows multiple level of stenosis involving the lumbar and thoracic spines.  No significant compromise however to explain the patient's symptoms are uncovered.   Medications:  Scheduled Meds: . amLODipine  5 mg Oral Daily  . aspirin EC  81 mg Oral Daily  . citalopram  20 mg Oral Daily  . dextrose  25 mL Intravenous Once  . enoxaparin (  LOVENOX) injection  40 mg Subcutaneous Q24H  . fluconazole  200 mg Oral Daily  . gabapentin  300 mg Oral BID  . insulin aspart  0-9 Units Subcutaneous TID WC  . insulin aspart protamine- aspart  30 Units Subcutaneous BID WC  . levothyroxine  25 mcg Oral QAC breakfast  . LORazepam  1 mg Oral QHS  . pantoprazole  40 mg Oral Q0600  . simvastatin  20 mg Oral q1800   Continuous Infusions: . sodium chloride 30 mL/hr at 02/15/19 1708  . sodium chloride 100 mL/hr at 02/18/19 0321   PRN Meds:.acetaminophen **OR** acetaminophen, ipratropium-albuterol, nitroGLYCERIN, ondansetron **OR** ondansetron (ZOFRAN) IV, oxyCODONE, senna-docusate, traZODone     LOS: 5 days   Ladonya Jerkins A. Gerilyn Pilgrim, M.D.  Diplomate, Biomedical engineer of Psychiatry and Neurology ( Neurology).

## 2019-02-18 NOTE — Progress Notes (Signed)
SLP Cancellation Note  Patient Details Name: [name] [name] MRN: [id number] DOB: 01-25-1936   Cancelled treatment:       Reason Eval/Treat Not Completed: SLP screened, no needs identified, will sign off   Tressie Stalker, M.S., CCC-SLP 02/18/2019, 4:10 PM

## 2019-02-18 NOTE — Progress Notes (Signed)
CODE STROKE CT TIMES 1301 OVERHEAD CODE STROKE PAGE 1302 BEEPER 1311 EXAM STARTED 1313 EXAM FINISHED  1313 IMAGES SENT TO [facility] 1317 EXAM COMPLETED IN EPIC 1318 [facility]

## 2019-02-19 LAB — GLUCOSE, RANDOM: GLUCOSE: 435 mg/dL — AB (ref 70–99)

## 2019-02-19 LAB — GLUCOSE, CAPILLARY
GLUCOSE-CAPILLARY: 404 mg/dL — AB (ref 70–99)
Glucose-Capillary: 253 mg/dL — ABNORMAL HIGH (ref 70–99)
Glucose-Capillary: 267 mg/dL — ABNORMAL HIGH (ref 70–99)
Glucose-Capillary: 195 mg/dL — ABNORMAL HIGH (ref 70–99)

## 2019-02-19 LAB — CREATININE, SERUM
Creatinine, Ser: 0.7 mg/dL (ref 0.44–1.00)
GFR calc Af Amer: >60 mL/min (ref >60)
GFR calc non Af Amer: >60 mL/min (ref >60)

## 2019-02-19 MED ORDER — INSULIN ASPART 100 UNIT/ML ~~LOC~~ SOLN
25.0000 [IU] | Freq: Once | SUBCUTANEOUS | Status: AC
  Administered 2019-02-19: 25 [IU] via SUBCUTANEOUS

## 2019-02-19 MED ORDER — INSULIN ASPART 100 UNIT/ML ~~LOC~~ SOLN
0.0000 [IU] | Freq: Three times a day (TID) | SUBCUTANEOUS | Status: DC
  Administered 2019-02-19: 11 [IU] via SUBCUTANEOUS
  Administered 2019-02-20: 7 [IU] via SUBCUTANEOUS
  Administered 2019-02-20: 15 [IU] via SUBCUTANEOUS
  Administered 2019-02-20: 11 [IU] via SUBCUTANEOUS
  Administered 2019-02-21 (×2): 15 [IU] via SUBCUTANEOUS
  Administered 2019-02-21: 11 [IU] via SUBCUTANEOUS
  Administered 2019-02-22: 15 [IU] via SUBCUTANEOUS
  Administered 2019-02-22: 4 [IU] via SUBCUTANEOUS

## 2019-02-19 MED ORDER — INSULIN ASPART 100 UNIT/ML ~~LOC~~ SOLN
0.0000 [IU] | Freq: Every day | SUBCUTANEOUS | Status: DC
  Administered 2019-02-20: 4 [IU] via SUBCUTANEOUS
  Administered 2019-02-21: 2 [IU] via SUBCUTANEOUS

## 2019-02-19 MED ORDER — SODIUM CHLORIDE 0.9 % IV SOLN
500.0000 mg | INTRAVENOUS | Status: AC
  Administered 2019-02-19 – 2019-02-20 (×2): 500 mg via INTRAVENOUS
  Filled 2019-02-19 (×2): qty 4

## 2019-02-19 NOTE — Progress Notes (Signed)
Physical Therapy Treatment Patient Details Name: [name] [name] MRN: [id number] DOB: [birth year] Today's Date: 02/19/2019    History of Present Illness [name] [name] is a 83 y.o. female with a complex past medical history especially detailed below but significant for COPD, hypertension, diabetes mellitus hypothyroidism coronary artery disease and multiple hospitalizations and ED visits over recent years presents to the emergency department after she had been seen several days ago reporting that she fell at home this morning.  She says that she has been generally weak and has difficulty standing.  She feels that her UTI is getting worse.  She was seen several days ago and diagnosed with a UTI and treated with ceftriaxone and discharged home on cephalexin.  She reports that she has been taking the medications as prescribed.  The urine culture did not have any significant bacterial growth.  She denies having chest pain and shortness of breath.  She denies having any significant trauma from the fall.  She apparently laid herself down when she started feeling weak.  She did not hit her head.  She was not down on the ground for prolonged period of time.  She does have poorly controlled diabetes mellitus.  She says that her blood glucose was tested by EMS and she was not hypoglycemic at the time of falling out.  She does have a history of having hypoglycemia from insulin.  She takes 70/30 insulin twice daily.  She also has a history of bradycardia.  She has had multiple syncopal episodes in the past that have been worked up during previous admissions.  She denies abdominal pain and headaches.  She reports that she has been having some dizziness as well.  Her main complaint is weakness in the lower extremities.  No vision changes.  She denies having weakness in the extremities.  The patient on had a an MRI of the brain done several days ago when she was seen in the ED that did not show any findings of acute CVA.   She had chronic microvascular changes reported from that study.    PT Comments    Patient presents supine in bed and agreeable to therapy. Per OT and RN, pt agitated this morning, but pt with pleasant mood, able to recall interaction with OT appropriately, and oriented to self, place, and situation throughout treatment session. Pt initially not wanting therapy, but once realized pt was soiled with urine, pt agreeable to practice transfers with PT. Pt able to perform supine to sit transfer with min assist for uprighting trunk. Pt with min assist for transfers using RW and able to take a few sidesteps to pivot to bedside chair with min assist. Pt able to lift L foot appropriately, sidestep without difficulty, and no unsteadiness noted in standing. Pt refuses to ambulate today. No moments of disorientation today throughout treatment session. Pt left up in chair with chair alarm on, call bell in lap, and lunch tray placed on table in front of pt; RN notified of treatment, pt's linens being soiled, and pt up in chair. Patient will benefit from continued physical therapy in hospital and recommended venue below to increase strength, balance, endurance for safe ADLs and gait.    Follow Up Recommendations  SNF     Equipment Recommendations  None recommended by PT    Recommendations for Other Services       Precautions / Restrictions Precautions Precautions: Fall Restrictions Weight Bearing Restrictions: No    Mobility  Bed Mobility Overal bed mobility:  Needs Assistance Bed Mobility: Supine to Sit     Supine to sit: Mod assist     General bed mobility comments: increased time, labored movement, assist with trunk uprighting  Transfers Overall transfer level: Needs assistance Equipment used: Rolling walker (2 wheeled) Transfers: Sit to/from [employer] Sit to Stand: Min assist Stand pivot transfers: Min assist       General transfer comment: increased time, labored  movement, fair weight-shifting and no loss of balance  Ambulation/Gait             General Gait Details: pt refused   Stairs             Wheelchair Mobility    Modified Rankin (Stroke Patients Only)       Balance Overall balance assessment: Needs assistance Sitting-balance support: Feet supported;Bilateral upper extremity supported Sitting balance-Leahy Scale: Fair Sitting balance - Comments: seated EOB   Standing balance support: Bilateral upper extremity supported;During functional activity Standing balance-Leahy Scale: Fair Standing balance comment: with RW                            Cognition Arousal/Alertness: Awake/alert Behavior During Therapy: WFL for tasks assessed/performed Overall Cognitive Status: Within Functional Limits for tasks assessed                                 General Comments: Pt alert/oriented, able to recall interaction with OT this morning appropriately      Exercises      General Comments        Pertinent Vitals/Pain Pain Assessment: No/denies pain    Home Living                      Prior Function            PT Goals (current goals can now be found in the care plan section) Acute Rehab PT Goals Patient Stated Goal: return home with family to assist PT Goal Formulation: With patient Time For Goal Achievement: 02/27/19 Potential to Achieve Goals: Good Progress towards PT goals: Progressing toward goals    Frequency    Min 3X/week      PT Plan Current plan remains appropriate    Co-evaluation              AM-PAC PT "6 Clicks" Mobility   Outcome Measure  Help needed turning from your back to your side while in a flat bed without using bedrails?: A Little Help needed moving from lying on your back to sitting on the side of a flat bed without using bedrails?: A Little Help needed moving to and from a bed to a chair (including a wheelchair)?: A Little Help needed  standing up from a chair using your arms (e.g., wheelchair or bedside chair)?: A Little Help needed to walk in hospital room?: A Lot Help needed climbing 3-5 steps with a railing? : Total 6 Click Score: 15    End of Session Equipment Utilized During Treatment: Gait belt Activity Tolerance: Patient tolerated treatment well;Patient limited by fatigue Patient left: with call bell/phone within reach;in chair;with chair alarm set Nurse Communication: Mobility status PT Visit Diagnosis: Unsteadiness on feet (R26.81);Muscle weakness (generalized) (M62.81);Other abnormalities of gait and mobility (R26.89)     Time: 1150-1206 PT Time Calculation (min) (ACUTE ONLY): 16 min  Charges:  $Therapeutic Activity: 8-22 mins  1:09 PM, 02/19/19 Domenick Bookbinder, DPT Physical Therapist with [facility] [phone] office

## 2019-02-19 NOTE — Care Management Important Message (Signed)
Important Message  Patient Details  Name: [name] [name] MRN: [id number] Date of Birth: 10/05/1936   Medicare Important Message Given:  Yes    Malcolm Metro, RN 02/19/2019, 3:43 PM

## 2019-02-19 NOTE — Progress Notes (Signed)
[facility] [name] Cue A. Gerilyn Pilgrim, MD     [email or web address]          [name] [name] is an 83 y.o. female.   Assessment/Plan: 1.  Subacute/acute weakness of the left lower extremity: Work-up so far has been negative for a myelopathy, lesion involving the brain and radiculopathy.  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  However, I think treatment is warranted.High-dose steroids for 2 out 3 days.   She feeling better with less fatigue but still w marked LLE weakness.     GENERAL: This is a obese female who appears to be in some discomfort but no acute distress.  HEENT:   Neck is supple no trauma appreciated.  EXTREMITIES: No edema; there is marked arthritic changes bilaterally   BACK: This is normal.  SKIN: Normal by inspection.    MENTAL STATUS: Alert and oriented. Speech, language and cognition are generally intact. Judgment and insight normal.   CRANIAL NERVES: Pupils are equal, round and reactive to light and accomodation; extra ocular movements are full, there is no significant nystagmus; visual fields are full; upper and lower facial muscles are normal in strength and symmetric, there is no flattening of the nasolabial folds; tongue is midline; uvula is midline; shoulder elevation is normal.  MOTOR: Normal tone, bulk and strength - RUE; no pronator drift.  Right hip flexion is 4+/5.  Right dorsiflexion 5/5.  The left upper extremity is normal.  The the left leg shows significant weakness proximally with hip flexion graded as 4/5 in dorsiflexion 0/5.  Bulk and tone are normal however.  COORDINATION: Left finger to nose is normal, right finger to nose is normal, No rest tremor; no intention  tremor; no postural tremor; no bradykinesia.  REFLEXES: Deep tendon reflexes are symmetrical and normal.   SENSATION: Normal to light touch.    Objective: Vital signs in last 24 hours: Temp:  [97.8 F (36.6 C)-98.6 F (37 C)] 98.1 F (36.7 C) (02/28 1502) Pulse Rate:  [63-92] 76 (02/28 1502) Resp:  [16-20] 16 (02/28 1502) BP: (133-159)/(59-78) 137/59 (02/28 1502) SpO2:  [90 %-98 %] 93 % (02/28 1502)  Intake/Output from previous day: 02/27 0701 - 02/28 0700 In: 2686.7 [P.O.:960; I.V.:1672.7; IV Piggyback:54] Out: 3600 [Urine:3600] Intake/Output this shift: Total I/O In: 856.6 [I.V.:856.6] Out: -  Nutritional status:  Diet Order            Diet heart healthy/carb modified Room service appropriate? Yes; Fluid consistency: Thin  Diet effective now               Lab Results: Results for orders placed or performed during the hospital encounter of 02/12/19 (from the past 48 hour(s))  Glucose, capillary     Status: Abnormal   Collection Time: 02/17/19  9:02 PM  Result Value Ref Range   Glucose-Capillary 238 (H) 70 - 99 mg/dL  Glucose, capillary     Status: Abnormal   Collection Time: 02/18/19  3:05 AM  Result Value Ref Range   Glucose-Capillary 135 (H) 70 - 99 mg/dL  Hemoglobin W0J     Status: Abnormal   Collection Time: 02/18/19  6:37 AM  Result Value Ref Range   Hgb A1c MFr Bld 8.5 (H) 4.8 -  5.6 %    Comment: (NOTE) Pre diabetes:          5.7%-6.4% Diabetes:              >6.4% Glycemic control for   <7.0% adults with diabetes    Mean Plasma Glucose 197.25 mg/dL    Comment: Performed at [facility], [address], [place], [place] [address]  Lipid panel     Status: Abnormal   Collection Time: 02/18/19  6:37 AM  Result Value Ref Range   Cholesterol 166 0 - 200 mg/dL   Triglycerides 90 <280 mg/dL   HDL 42 >03 mg/dL   Total CHOL/HDL Ratio 4.0 RATIO   VLDL 18 0 - 40 mg/dL   LDL Cholesterol 491 (H) 0 - 99 mg/dL    Comment:        Total  Cholesterol/HDL:CHD Risk Coronary Heart Disease Risk Table                     Men   Women  1/2 Average Risk   3.4   3.3  Average Risk       5.0   4.4  2 X Average Risk   9.6   7.1  3 X Average Risk  23.4   11.0        Use the calculated Patient Ratio above and the CHD Risk Table to determine the patient's CHD Risk.        ATP III CLASSIFICATION (LDL):  <100     mg/dL   Optimal  791-505  mg/dL   Near or Above                    Optimal  130-159  mg/dL   Borderline  697-948  mg/dL   High  >016     mg/dL   Very High Performed at [facility], [address] [place], [place] [address]   Glucose, capillary     Status: Abnormal   Collection Time: 02/18/19  8:56 AM  Result Value Ref Range   Glucose-Capillary 164 (H) 70 - 99 mg/dL  Glucose, capillary     Status: Abnormal   Collection Time: 02/18/19 12:06 PM  Result Value Ref Range   Glucose-Capillary 150 (H) 70 - 99 mg/dL  Glucose, capillary     Status: Abnormal   Collection Time: 02/18/19  4:09 PM  Result Value Ref Range   Glucose-Capillary 108 (H) 70 - 99 mg/dL   Comment 1 Notify RN    Comment 2 Document in Chart   Glucose, capillary     Status: None   Collection Time: 02/18/19 10:33 PM  Result Value Ref Range   Glucose-Capillary 89 70 - 99 mg/dL   Comment 1 Notify RN    Comment 2 Document in Chart   Creatinine, serum     Status: None   Collection Time: 02/19/19  5:50 AM  Result Value Ref Range   Creatinine, Ser 0.70 0.44 - 1.00 mg/dL   GFR calc non Af Amer >60 >60 mL/min   GFR calc Af Amer >60 >60 mL/min    Comment: Performed at [facility], [address] [place], [place] [address]  Glucose, capillary     Status: Abnormal   Collection Time: 02/19/19  7:28 AM  Result Value Ref Range   Glucose-Capillary 253 (H) 70 - 99 mg/dL  Glucose, capillary     Status: Abnormal   Collection Time: 02/19/19 11:19 AM  Result Value  Ref Range   Glucose-Capillary 404 (H) 70 - 99 mg/dL  Glucose, random     Status: Abnormal    Collection Time: 02/19/19 11:33 AM  Result Value Ref Range   Glucose, Bld 435 (H) 70 - 99 mg/dL    Comment: Performed at [facility], [address], [place], [place] [address]  Glucose, capillary     Status: Abnormal   Collection Time: 02/19/19  5:00 PM  Result Value Ref Range   Glucose-Capillary 267 (H) 70 - 99 mg/dL   Comment 1 Notify RN    Comment 2 Document in Chart     Lipid Panel Recent Labs    02/18/19 0637  CHOL 166  TRIG 90  HDL 42  CHOLHDL 4.0  VLDL 18  LDLCALC 492*    Studies/Results: CERVICAL, THORACIC AND LUMBAR SPINE MRI:  IMPRESSION: Cervical region: No cord compression or primary cord lesion. Right paracentral disc herniation at C4-5 with slight upward migration, not apparently resulting in neural compression. Spondylosis at C5-6 and C6-7 with moderate right foraminal narrowing at C5-6 and mild bilateral foraminal narrowing at C6-7.  Thoracic region: No fracture. No primary cord lesion. Degenerative spondylosis at multiple levels without apparent neural compression. Left posterolateral disc herniation at T7-8 which does contact the left side of the cord and could be symptomatic. Ample subarachnoid spaces present dorsal to cord however.  Lumbar region: No change appreciated since December. Previous left-sided decompression at L1-2 and L2-3. No ongoing left-sided neural compression visible at those levels.  L3-4: Mild stenosis of the lateral recesses and foramina but without definite neural compression.  L4-5: Bilateral lateral recess and foraminal stenosis because of encroachment by osteophyte in disc material. Neural compression could occur on either side at this level.   The scans are reviewed and shows multiple level of stenosis involving the lumbar and thoracic spines.  No significant compromise however to explain the patient's symptoms are uncovered.   Medications:  Scheduled Meds: . amLODipine  5 mg Oral Daily  . aspirin EC  81 mg  Oral Daily  . citalopram  20 mg Oral Daily  . dextrose  25 mL Intravenous Once  . enoxaparin (LOVENOX) injection  40 mg Subcutaneous Q24H  . fluconazole  200 mg Oral Daily  . gabapentin  300 mg Oral BID  . insulin aspart  0-20 Units Subcutaneous TID WC  . insulin aspart  0-5 Units Subcutaneous QHS  . insulin aspart protamine- aspart  30 Units Subcutaneous BID WC  . levothyroxine  25 mcg Oral QAC breakfast  . LORazepam  1 mg Oral QHS  . pantoprazole  40 mg Oral Q0600  . simvastatin  20 mg Oral q1800   Continuous Infusions: . sodium chloride 30 mL/hr at 02/15/19 1708  . sodium chloride 100 mL/hr at 02/19/19 0338  . methylPREDNISolone (SOLU-MEDROL) injection     PRN Meds:.acetaminophen **OR** acetaminophen, ipratropium-albuterol, nitroGLYCERIN, ondansetron **OR** ondansetron (ZOFRAN) IV, oxyCODONE, senna-docusate, traZODone     LOS: 6 days   Kalisi Bevill A. Gerilyn [name], M.D.  Diplomate, Biomedical engineer of Psychiatry and Neurology ( Neurology).

## 2019-02-19 NOTE — Evaluation (Signed)
Occupational Therapy Evaluation Patient Details Name: [name] [name] MRN: [id number] DOB: May 11, 1936 Today's Date: 02/19/2019    History of Present Illness [name] [name] is a 83 y.o. female with a complex past medical history especially detailed below but significant for COPD, hypertension, diabetes mellitus hypothyroidism coronary artery disease and multiple hospitalizations and ED visits over recent years presents to the emergency department after she had been seen several days ago reporting that she fell at home this morning.  She says that she has been generally weak and has difficulty standing.  She feels that her UTI is getting worse.  She was seen several days ago and diagnosed with a UTI and treated with ceftriaxone and discharged home on cephalexin.  She reports that she has been taking the medications as prescribed.  The urine culture did not have any significant bacterial growth.  She denies having chest pain and shortness of breath.  She denies having any significant trauma from the fall.  She apparently laid herself down when she started feeling weak.  She did not hit her head.  She was not down on the ground for prolonged period of time.  She does have poorly controlled diabetes mellitus.  She says that her blood glucose was tested by EMS and she was not hypoglycemic at the time of falling out.  She does have a history of having hypoglycemia from insulin.  She takes 70/30 insulin twice daily.  She also has a history of bradycardia.  She has had multiple syncopal episodes in the past that have been worked up during previous admissions.  She denies abdominal pain and headaches.  She reports that she has been having some dizziness as well.  Her main complaint is weakness in the lower extremities.  No vision changes.  She denies having weakness in the extremities.  The patient on had a an MRI of the brain done several days ago when she was seen in the ED that did not show any findings of acute  CVA.  She had chronic microvascular changes reported from that study.   Clinical Impression   Pt agreeable to OT evaluation this am, initially oriented to person/place/situation. However during evaluation pt cognition limited, becoming agitated and demanding to be allowed to walk to the kitchen. OT reorienting pt throughout session. Pt with significant difficulty with transfer to [facility] and back to bed due to LLE weakness and poor ability to weightshift this am. Upon requesting pt return to bed (as pt not safe to transfer to chair due to weakness and cognition), pt becoming agitated and demanding to get up, go to kitchen, etc. RN called for assistance who also attempted to reorient pt. Pt requiring total assist to return to bed. Recommend SNF on discharge to improve safety and independence in ADLs and mobility tasks.     Follow Up Recommendations  SNF;Supervision/Assistance - 24 hour    Equipment Recommendations  None recommended by OT       Precautions / Restrictions Precautions Precautions: Fall Restrictions Weight Bearing Restrictions: No      Mobility Bed Mobility Overal bed mobility: Needs Assistance Bed Mobility: Supine to Sit;Sit to Supine     Supine to sit: Mod assist Sit to supine: Total assist;+2 for physical assistance;HOB elevated   General bed mobility comments: Pt agitated and refusing to perform sit to supine, RN came to assist   Transfers Overall transfer level: Needs assistance Equipment used: Rolling walker (2 wheeled) Transfers: Sit to/from [employer] Sit to Stand:  Mod assist Stand pivot transfers: Mod assist       General transfer comment: increased time, OT assisting with shifting and mobilizing LLE. Constant cuing due to cognition        ADL either performed or assessed with clinical judgement   ADL Overall ADL's : Needs assistance/impaired                     Lower Body Dressing: Maximal assistance;Sitting/lateral leans;Sit  to/from stand   Toilet Transfer: Moderate assistance;Stand-pivot;BSC;RW Toilet Transfer Details (indicate cue type and reason): Consistent firm verbal cuing for following diretions during transfer task. Poor ability to weight shift and move LLE during transfer Toileting- Clothing Manipulation and Hygiene: Moderate assistance;Sitting/lateral lean;Sit to/from stand Toileting - Clothing Manipulation Details (indicate cue type and reason): Pt attempting to doff/donn underwear, OT assisting when unsuccessfull       General ADL Comments: Pt requiring mod to max assist with ADLs this am, largely due to cognition     Vision Baseline Vision/History: No visual deficits Patient Visual Report: No change from baseline Vision Assessment?: No apparent visual deficits            Pertinent Vitals/Pain Pain Assessment: Faces Faces Pain Scale: Hurts a little bit Pain Location: chronic low back Pain Descriptors / Indicators: Aching Pain Intervention(s): Limited activity within patient's tolerance;Monitored during session;Repositioned     Hand Dominance Right   Extremity/Trunk Assessment Upper Extremity Assessment Upper Extremity Assessment: Generalized weakness   Lower Extremity Assessment Lower Extremity Assessment: Defer to PT evaluation   Cervical / Trunk Assessment Cervical / Trunk Assessment: Normal   Communication Communication Communication: No difficulties   Cognition Arousal/Alertness: Awake/alert Behavior During Therapy: Agitated;Restless;Impulsive Overall Cognitive Status: Impaired/Different from baseline Area of Impairment: Orientation;Following commands;Safety/judgement;Awareness                 Orientation Level: Disoriented to;Situation     Following Commands: Follows one step commands inconsistently Safety/Judgement: Decreased awareness of safety     General Comments: Pt very impulsive this am, evaluation limited by cognition. Pt stating deficits then  demanding to be allowed to walk to doorway. Pt calling for [name] [name] (her sister), then telling OT [name] [name] is gone (passed away)              Home Living Family/patient expects to be discharged to:: Skilled nursing facility Living Arrangements: Spouse/significant other;Children Available Help at Discharge: Family Type of Home: House Home Access: Stairs to enter [employer] CorporationEntrance Stairs-Number of Steps: 2 Entrance Stairs-Rails: Right;Left;Can reach both Home Layout: One level     Bathroom Shower/Tub: Chief Strategy OfficerTub/shower unit   Bathroom Toilet: Standard     Home Equipment: Shower seat;Cane - single point;Walker - 2 wheels;Bedside commode   Additional Comments: presently patient's spouse at SNF, her son admitted in hospital and daughter on disability      Prior Functioning/Environment Level of Independence: Independent with assistive device(s)        Comments: community ambulator with [employer], drives        OT Problem List: Decreased strength;Decreased activity tolerance;Impaired balance (sitting and/or standing);Decreased cognition;Decreased safety awareness;Decreased knowledge of use of DME or AE       End of Session Equipment Utilized During Treatment: Gait belt;Rolling walker Nurse Communication: Mobility status;Other (comment)(cognition)  Activity Tolerance: Other (comment)(limited by cognition and inability to follow directions) Patient left: in bed;with call bell/phone within reach;with bed alarm set  OT Visit Diagnosis: Muscle weakness (generalized) (M62.81);Other symptoms and signs involving cognitive function  Time: 2924-4628 OT Time Calculation (min): 46 min Charges:  OT General Charges $OT Visit: 1 Visit OT Evaluation $OT Eval Moderate Complexity: 1 [address] OTR/L  [phone] 02/19/2019, 8:31 AM

## 2019-02-19 NOTE — Progress Notes (Signed)
PROGRESS NOTE  [name] [name]  WJX:[id number]  DOB: 1936/10/14  DOA: 02/12/2019 PCP: Oval Linsey, MD  Brief Admission Hx: 83 y.o. female with a complex past medical history especially detailed below but significant for COPD, hypertension, diabetes mellitus hypothyroidism coronary artery disease and multiple hospitalizations and ED visits over recent years presents to the emergency department after she had been seen several days ago reporting that she fell at home this morning.  MDM/Assessment & Plan:   1. Left lower extremity weakness.  Imaging did not reveal any underlying CVA.  She was seen by neurology and has undergone imaging of her cervical, thoracic and lumbar spine without any indications for myelopathy.  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  She will receive a total of 3 days of steroids.  She is also being treated with dual antiplatelet therapy for severe intracranial stenosis.  She does report some improvement of her symptoms. 2. Acute generalized weakness- this was present on admission and initially improved with IV fluids.  3. Type 2 diabetes mellitus, insulin requiring with neurological complications- monitor blood glucose closely as she has had some hypoglycemia.  Monitor blood glucose 5 times per day.  Sliding scale coverage ordered.  4. Leukocytosis- suspect secondary to yeast UTI, check blood cultures for candidemia. WBC trended down with treatment. 5. Symptomatic Yeast UTI-patient is symptomatic from this and we will treat.  Fluconazole oral tablets x14 days ordered.  Pyridium ordered for dysuria.  Recommend urology follow up outpatient.  She has been to [facility] in the past.   6. AKI-RESOLVED likely prerenal and treated with IV hydration. 7. Fall at home-fall precautions recommended and PT recommending SNF.  Pt now agreeable.  8. Hypothyroidism- TSH stable.  9. Anemia of chronic disease- stable. 10. Chronic  gait instability- fall precautions and PT recommending SNF. 11. OSA-will offer nightly CPAP. 12. Essential hypertension- stable BP.  13. Chronic pain and opioid dependence - resumed home meds.  Patient had a negative urine drug screen on 02/08/2019.  DVT Prophylaxis: Lovenox Code Status: Full Family Communication: Patient updated at bedside Disposition Plan: INP, awaiting SNF placement  Consultants:  PT and social work  Neurology  Procedures:    Antimicrobials:  Fluconazole 2/21 >> (14 day course)  Subjective: Further says that left leg feels about the same, after further questioning, feels that it may be a little better than yesterday.  Objective: Vitals:   02/19/19 0701 02/19/19 1102 02/19/19 1502 02/19/19 1902  BP: (!) 145/70 133/67 (!) 137/59 (!) 145/88  Pulse: 81 92 76 82  Resp: Temp: 98.3 F (36.8 C)  98.1 F (36.7 C) 98.4 F (36.9 C)  TempSrc: Oral  Oral Oral  SpO2: 90% 93% 93% 100%  Weight:      Height:        Intake/Output Summary (Last 24 hours) at 02/19/2019 1955 Last data filed at 02/19/2019 1504 Gross per 24 hour  Intake 3783.26 ml  Output 3000 ml  Net 783.26 ml   Filed Weights   02/12/19 0811 02/12/19 1447 02/15/19 0543  Weight: 85.7 kg 90.3 kg 91.6 kg   REVIEW OF SYSTEMS  As per history otherwise all reviewed and reported negative  Exam:  General exam: Alert, awake, oriented x 3 Respiratory system: Clear bilaterally. Respiratory effort normal. Cardiovascular system:RRR. No murmurs, rubs, gallops. Gastrointestinal system: Abdomen is nondistended, soft and nontender. No organomegaly or masses felt. Normal bowel sounds heard. Central nervous system: Alert  and oriented.  Weakness in left lower extremity improving. Extremities: No C/C/E, +pedal pulses Skin: No rashes, lesions or ulcers Psychiatry: Judgement and insight appear normal. Mood & affect appropriate.      Data Reviewed: Basic Metabolic Panel: Recent Labs  Lab  02/13/19 0645 02/17/19 1313 02/19/19 0550 02/19/19 1133  NA 137 136  --   --   K 4.2 4.1  --   --   CL 104 103  --   --   CO2 25 26  --   --   GLUCOSE 211* 213*  --  435*  BUN 12 12  --   --   CREATININE 0.72 0.88 0.70  --   CALCIUM 8.9 9.1  --   --   MG 1.8  --   --   --    Liver Function Tests: Recent Labs  Lab 02/13/19 0645 02/17/19 1313  AST 12* 16  ALT 11 18  ALKPHOS 58 64  BILITOT 0.6 0.4  PROT 6.0* 6.4*  ALBUMIN 3.2* 3.4*   No results for input(s): LIPASE, AMYLASE in the last 168 hours. No results for input(s): AMMONIA in the last 168 hours. CBC: Recent Labs  Lab 02/13/19 0645 02/17/19 1313  WBC 11.4* 9.9  NEUTROABS 7.8*  --   HGB 11.5* 11.7*  HCT 39.9 40.6  MCV 81.6 81.2  PLT 276 308   Cardiac Enzymes: No results for input(s): CKTOTAL, CKMB, CKMBINDEX, TROPONINI in the last 168 hours. CBG (last 3)  Recent Labs    02/19/19 0728 02/19/19 1119 02/19/19 1700  GLUCAP 253* 404* 267*   Recent Results (from the past 240 hour(s))  Culture, blood (Routine X 2) w Reflex to ID Panel     Status: None   Collection Time: 02/12/19  3:02 PM  Result Value Ref Range Status   Specimen Description BLOOD LEFT ANTECUBITAL  Final   Special Requests   Final    BOTTLES DRAWN AEROBIC AND ANAEROBIC Blood Culture adequate volume   Culture   Final    NO GROWTH 5 DAYS Performed at [facility], [address] [place], [place] [address]    Report Status 02/17/2019 FINAL  Final  Culture, blood (Routine X 2) w Reflex to ID Panel     Status: None   Collection Time: 02/12/19  3:03 PM  Result Value Ref Range Status   Specimen Description BLOOD BLOOD LEFT HAND  Final   Special Requests   Final    BOTTLES DRAWN AEROBIC AND ANAEROBIC Blood Culture adequate volume   Culture   Final    NO GROWTH 5 DAYS Performed at [facility], [address], [place], [place] [address]    Report Status 02/17/2019 FINAL  Final  Urine Culture     Status: None   Collection Time: 02/12/19 11:45  PM  Result Value Ref Range Status   Specimen Description   Final    URINE, CLEAN CATCH Performed at [facility], [address] [place], [place] [address]    Special Requests   Final    NONE Performed at [facility], [address], [place], [place] [address]    Culture   Final    NO GROWTH Performed at [facility], [address] [place], [place] [address]    Report Status 02/14/2019 FINAL  Final     Studies: Mr Cervical Spine Wo Contrast  Result Date: 02/18/2019 CLINICAL DATA:  Back pain and left lower extremity weakness over the last week. EXAM:  MRI CERVICAL, THORACIC AND LUMBAR SPINE WITHOUT AND WITH CONTRAST TECHNIQUE: Multiplanar and multiecho pulse sequences of the cervical spine, to include the craniocervical junction and cervicothoracic junction, and thoracic and lumbar spine, were obtained without and with intravenous contrast. COMPARISON:  CT 02/12/2019.  Lumbar MRI 12/21/2018 CONTRAST:  10 cc Gadavist FINDINGS: MRI CERVICAL SPINE FINDINGS Alignment: Normal Vertebrae: No fracture or primary bone lesion. Cord: No cord compression or primary cord lesion. Posterior Fossa, vertebral arteries, paraspinal tissues: Negative Disc levels: No abnormality at C1-2, C2-3 or C3-4. C4-5: Right paracentral disc herniation with slight upward migration indents the ventral subarachnoid space but does not compress the cord or show foraminal extension. C5-6: Endplate osteophytes and bulging of the disc more prominent towards the right. No compressive canal stenosis. Right foraminal narrowing of a moderate degree. C6-7: Bulging of the disc and bilateral uncovertebral prominence. No compressive central canal stenosis. Mild bilateral foraminal narrowing. C7-T1: Bulging of the disc.  No canal or foraminal stenosis. MRI THORACIC SPINE FINDINGS Alignment:  No significant malalignment. Vertebrae: No fracture or primary bone lesion. Cord:  No primary cord lesion.  See below regarding stenosis. Paraspinal  and other soft tissues: Negative Disc levels: T2-3: Mild noncompressive disc bulge. T4-5: Mild noncompressive disc bulge. T5-6: Shallow left posterolateral herniation without visible neural compression T6-7: Shallow central disc herniation without visible neural compression. T7-8: Central to left-sided disc herniation, contacting the left side of the cord. This could possibly be symptomatic. T8-9: Mild noncompressive disc bulge. T9-10: Mild noncompressive disc bulge. T10-11 through T12-L1: Mild noncompressive disc bulges. MRI LUMBAR SPINE FINDINGS Segmentation:  5 lumbar type vertebral bodies. Alignment: Straightening of the normal lumbar lordosis. Mild curvature convex to the left. Vertebrae: No fracture or primary bone lesion. Discogenic endplate marrow changes on the right at L3-4 and L4-5. Conus medullaris and cauda equina: Conus extends to the L1 level. Conus and cauda equina appear normal. Paraspinal and other soft tissues: Negative Disc levels: L1-2: Previous left hemilaminectomy. Broad-based disc herniation more prominent in the left posterolateral direction. This indents the thecal sac but does not cause visible neural compression. L2-3: Previous left hemilaminectomy. Endplate osteophytes and bulging of the disc. No apparent compressive stenosis. Foraminal to extraforaminal encroachment on the right by osteophyte in disc does not appear to compress the L2 nerve, but could irritate that structure. L3-4: Endplate osteophytes and bulging of the disc. Mild facet and ligamentous hypertrophy. Mild stenosis of the lateral recesses and foramina but without definite neural compression. L4-5: Endplate osteophytes and broad-based protrusion of the disc. Mild facet and ligamentous hypertrophy. Stenosis of the lateral recesses and neural foramina that could cause neural compression on either or both sides. L5-S1: Mild bulging of the disc.  No canal or foraminal stenosis. IMPRESSION: Cervical region: No cord compression  or primary cord lesion. Right paracentral disc herniation at C4-5 with slight upward migration, not apparently resulting in neural compression. Spondylosis at C5-6 and C6-7 with moderate right foraminal narrowing at C5-6 and mild bilateral foraminal narrowing at C6-7. Thoracic region: No fracture. No primary cord lesion. Degenerative spondylosis at multiple levels without apparent neural compression. Left posterolateral disc herniation at T7-8 which does contact the left side of the cord and could be symptomatic. Ample subarachnoid spaces present dorsal to cord however. Lumbar region: No change appreciated since December. Previous left-sided decompression at L1-2 and L2-3. No ongoing left-sided neural compression visible at those levels. L3-4: Mild stenosis of the lateral recesses and foramina but without definite neural compression. L4-5: Bilateral lateral recess  and foraminal stenosis because of encroachment by osteophyte in disc material. Neural compression could occur on either side at this level. Electronically Signed   By: Paulina FusiMark  Shogry M.D.   On: 02/18/2019 09:44   Mr Thoracic Spine Wo Contrast  Result Date: 02/18/2019 CLINICAL DATA:  Back pain and left lower extremity weakness over the last week. EXAM: MRI CERVICAL, THORACIC AND LUMBAR SPINE WITHOUT AND WITH CONTRAST TECHNIQUE: Multiplanar and multiecho pulse sequences of the cervical spine, to include the craniocervical junction and cervicothoracic junction, and thoracic and lumbar spine, were obtained without and with intravenous contrast. COMPARISON:  CT 02/12/2019.  Lumbar MRI 12/21/2018 CONTRAST:  10 cc Gadavist FINDINGS: MRI CERVICAL SPINE FINDINGS Alignment: Normal Vertebrae: No fracture or primary bone lesion. Cord: No cord compression or primary cord lesion. Posterior Fossa, vertebral arteries, paraspinal tissues: Negative Disc levels: No abnormality at C1-2, C2-3 or C3-4. C4-5: Right paracentral disc herniation with slight upward migration  indents the ventral subarachnoid space but does not compress the cord or show foraminal extension. C5-6: Endplate osteophytes and bulging of the disc more prominent towards the right. No compressive canal stenosis. Right foraminal narrowing of a moderate degree. C6-7: Bulging of the disc and bilateral uncovertebral prominence. No compressive central canal stenosis. Mild bilateral foraminal narrowing. C7-T1: Bulging of the disc.  No canal or foraminal stenosis. MRI THORACIC SPINE FINDINGS Alignment:  No significant malalignment. Vertebrae: No fracture or primary bone lesion. Cord:  No primary cord lesion.  See below regarding stenosis. Paraspinal and other soft tissues: Negative Disc levels: T2-3: Mild noncompressive disc bulge. T4-5: Mild noncompressive disc bulge. T5-6: Shallow left posterolateral herniation without visible neural compression T6-7: Shallow central disc herniation without visible neural compression. T7-8: Central to left-sided disc herniation, contacting the left side of the cord. This could possibly be symptomatic. T8-9: Mild noncompressive disc bulge. T9-10: Mild noncompressive disc bulge. T10-11 through T12-L1: Mild noncompressive disc bulges. MRI LUMBAR SPINE FINDINGS Segmentation:  5 lumbar type vertebral bodies. Alignment: Straightening of the normal lumbar lordosis. Mild curvature convex to the left. Vertebrae: No fracture or primary bone lesion. Discogenic endplate marrow changes on the right at L3-4 and L4-5. Conus medullaris and cauda equina: Conus extends to the L1 level. Conus and cauda equina appear normal. Paraspinal and other soft tissues: Negative Disc levels: L1-2: Previous left hemilaminectomy. Broad-based disc herniation more prominent in the left posterolateral direction. This indents the thecal sac but does not cause visible neural compression. L2-3: Previous left hemilaminectomy. Endplate osteophytes and bulging of the disc. No apparent compressive stenosis. Foraminal to  extraforaminal encroachment on the right by osteophyte in disc does not appear to compress the L2 nerve, but could irritate that structure. L3-4: Endplate osteophytes and bulging of the disc. Mild facet and ligamentous hypertrophy. Mild stenosis of the lateral recesses and foramina but without definite neural compression. L4-5: Endplate osteophytes and broad-based protrusion of the disc. Mild facet and ligamentous hypertrophy. Stenosis of the lateral recesses and neural foramina that could cause neural compression on either or both sides. L5-S1: Mild bulging of the disc.  No canal or foraminal stenosis. IMPRESSION: Cervical region: No cord compression or primary cord lesion. Right paracentral disc herniation at C4-5 with slight upward migration, not apparently resulting in neural compression. Spondylosis at C5-6 and C6-7 with moderate right foraminal narrowing at C5-6 and mild bilateral foraminal narrowing at C6-7. Thoracic region: No fracture. No primary cord lesion. Degenerative spondylosis at multiple levels without apparent neural compression. Left posterolateral disc herniation at T7-8 which  does contact the left side of the cord and could be symptomatic. Ample subarachnoid spaces present dorsal to cord however. Lumbar region: No change appreciated since December. Previous left-sided decompression at L1-2 and L2-3. No ongoing left-sided neural compression visible at those levels. L3-4: Mild stenosis of the lateral recesses and foramina but without definite neural compression. L4-5: Bilateral lateral recess and foraminal stenosis because of encroachment by osteophyte in disc material. Neural compression could occur on either side at this level. Electronically Signed   By: Paulina Fusi M.D.   On: 02/18/2019 09:44   Mr Lumbar Spine W Wo Contrast  Result Date: 02/18/2019 CLINICAL DATA:  Back pain and left lower extremity weakness over the last week. EXAM: MRI CERVICAL, THORACIC AND LUMBAR SPINE WITHOUT AND WITH  CONTRAST TECHNIQUE: Multiplanar and multiecho pulse sequences of the cervical spine, to include the craniocervical junction and cervicothoracic junction, and thoracic and lumbar spine, were obtained without and with intravenous contrast. COMPARISON:  CT 02/12/2019.  Lumbar MRI 12/21/2018 CONTRAST:  10 cc Gadavist FINDINGS: MRI CERVICAL SPINE FINDINGS Alignment: Normal Vertebrae: No fracture or primary bone lesion. Cord: No cord compression or primary cord lesion. Posterior Fossa, vertebral arteries, paraspinal tissues: Negative Disc levels: No abnormality at C1-2, C2-3 or C3-4. C4-5: Right paracentral disc herniation with slight upward migration indents the ventral subarachnoid space but does not compress the cord or show foraminal extension. C5-6: Endplate osteophytes and bulging of the disc more prominent towards the right. No compressive canal stenosis. Right foraminal narrowing of a moderate degree. C6-7: Bulging of the disc and bilateral uncovertebral prominence. No compressive central canal stenosis. Mild bilateral foraminal narrowing. C7-T1: Bulging of the disc.  No canal or foraminal stenosis. MRI THORACIC SPINE FINDINGS Alignment:  No significant malalignment. Vertebrae: No fracture or primary bone lesion. Cord:  No primary cord lesion.  See below regarding stenosis. Paraspinal and other soft tissues: Negative Disc levels: T2-3: Mild noncompressive disc bulge. T4-5: Mild noncompressive disc bulge. T5-6: Shallow left posterolateral herniation without visible neural compression T6-7: Shallow central disc herniation without visible neural compression. T7-8: Central to left-sided disc herniation, contacting the left side of the cord. This could possibly be symptomatic. T8-9: Mild noncompressive disc bulge. T9-10: Mild noncompressive disc bulge. T10-11 through T12-L1: Mild noncompressive disc bulges. MRI LUMBAR SPINE FINDINGS Segmentation:  5 lumbar type vertebral bodies. Alignment: Straightening of the normal  lumbar lordosis. Mild curvature convex to the left. Vertebrae: No fracture or primary bone lesion. Discogenic endplate marrow changes on the right at L3-4 and L4-5. Conus medullaris and cauda equina: Conus extends to the L1 level. Conus and cauda equina appear normal. Paraspinal and other soft tissues: Negative Disc levels: L1-2: Previous left hemilaminectomy. Broad-based disc herniation more prominent in the left posterolateral direction. This indents the thecal sac but does not cause visible neural compression. L2-3: Previous left hemilaminectomy. Endplate osteophytes and bulging of the disc. No apparent compressive stenosis. Foraminal to extraforaminal encroachment on the right by osteophyte in disc does not appear to compress the L2 nerve, but could irritate that structure. L3-4: Endplate osteophytes and bulging of the disc. Mild facet and ligamentous hypertrophy. Mild stenosis of the lateral recesses and foramina but without definite neural compression. L4-5: Endplate osteophytes and broad-based protrusion of the disc. Mild facet and ligamentous hypertrophy. Stenosis of the lateral recesses and neural foramina that could cause neural compression on either or both sides. L5-S1: Mild bulging of the disc.  No canal or foraminal stenosis. IMPRESSION: Cervical region: No cord compression or  primary cord lesion. Right paracentral disc herniation at C4-5 with slight upward migration, not apparently resulting in neural compression. Spondylosis at C5-6 and C6-7 with moderate right foraminal narrowing at C5-6 and mild bilateral foraminal narrowing at C6-7. Thoracic region: No fracture. No primary cord lesion. Degenerative spondylosis at multiple levels without apparent neural compression. Left posterolateral disc herniation at T7-8 which does contact the left side of the cord and could be symptomatic. Ample subarachnoid spaces present dorsal to cord however. Lumbar region: No change appreciated since December. Previous  left-sided decompression at L1-2 and L2-3. No ongoing left-sided neural compression visible at those levels. L3-4: Mild stenosis of the lateral recesses and foramina but without definite neural compression. L4-5: Bilateral lateral recess and foraminal stenosis because of encroachment by osteophyte in disc material. Neural compression could occur on either side at this level. Electronically Signed   By: Paulina Fusi M.D.   On: 02/18/2019 09:44   Scheduled Meds: . amLODipine  5 mg Oral Daily  . aspirin EC  81 mg Oral Daily  . citalopram  20 mg Oral Daily  . dextrose  25 mL Intravenous Once  . enoxaparin (LOVENOX) injection  40 mg Subcutaneous Q24H  . fluconazole  200 mg Oral Daily  . gabapentin  300 mg Oral BID  . insulin aspart  0-20 Units Subcutaneous TID WC  . insulin aspart  0-5 Units Subcutaneous QHS  . insulin aspart protamine- aspart  30 Units Subcutaneous BID WC  . levothyroxine  25 mcg Oral QAC breakfast  . LORazepam  1 mg Oral QHS  . pantoprazole  40 mg Oral Q0600  . simvastatin  20 mg Oral q1800   Continuous Infusions: . sodium chloride 30 mL/hr at 02/15/19 1708  . sodium chloride 100 mL/hr at 02/19/19 0338  . methylPREDNISolone (SOLU-MEDROL) injection      Active Problems:   Hypothyroidism   DM type 2 causing complication (HCC)   Hyperlipidemia   Obesity   Essential hypertension   Arteriosclerotic cardiovascular disease (ASCVD)   Sleep apnea   Chronic obstructive pulmonary disease (HCC)   Noncompliance   Noncompliance with CPAP treatment   Gait instability   Anemia of chronic disease   Hyperglycemia   Generalized weakness   Fall at home   Symptomatic Yeast UTI   Leukocytosis   AKI (acute kidney injury) (HCC)  Time spent:  Erick Blinks, MD [facility] 02/19/2019, 7:55 PM    LOS: 6 days

## 2019-02-20 LAB — BASIC METABOLIC PANEL
Anion gap: 8 (ref 5–15)
BUN: 19 mg/dL (ref 8–23)
CALCIUM: 9 mg/dL (ref 8.9–10.3)
CO2: 23 mmol/L (ref 22–32)
Chloride: 108 mmol/L (ref 98–111)
Creatinine, Ser: 0.7 mg/dL (ref 0.44–1.00)
Glucose, Bld: 279 mg/dL — ABNORMAL HIGH (ref 70–99)
Potassium: 4 mmol/L (ref 3.5–5.1)
Sodium: 139 mmol/L (ref 135–145)

## 2019-02-20 LAB — GLUCOSE, CAPILLARY
GLUCOSE-CAPILLARY: 343 mg/dL — AB (ref 70–99)
Glucose-Capillary: 300 mg/dL — ABNORMAL HIGH (ref 70–99)
Glucose-Capillary: 312 mg/dL — ABNORMAL HIGH (ref 70–99)
Glucose-Capillary: 254 mg/dL — ABNORMAL HIGH (ref 70–99)
Glucose-Capillary: 234 mg/dL — ABNORMAL HIGH (ref 70–99)

## 2019-02-20 MED ORDER — PANTOPRAZOLE SODIUM 40 MG PO TBEC: 40.0000 mg | DELAYED_RELEASE_TABLET | Freq: Every day | ORAL | Status: DC

## 2019-02-20 NOTE — Progress Notes (Signed)
Fall occurred. NT and RN were assisting patient to the toilet in the bathroom. When she was finished, she stood up and fell back on her bottom. Staff were in the bathroom with her when she fell. Patient did not appear to have hit her head. Patient stated that she was fine and had 0/10 pain. Vital signs were taken and patient assisted to chair.

## 2019-02-20 NOTE — Progress Notes (Signed)
Physician Discharge Summary  [name] [name] ZOX:[id number] DOB: March 06, 1936 DOA: 02/12/2019  PCP: Oval Linsey, MD  Admit date: 02/12/2019 Discharge date: 02/21/2019  Admitted From: Home Disposition: Skilled nursing facility  Recommendations for Outpatient Follow-up:  1. Follow up with PCP in 1-2 weeks 2. Please obtain BMP/CBC in one week 3. Follow-up with neurology in 4 weeks  Discharge Condition: Stable CODE STATUS: Full code Diet recommendation: Carb modified, heart healthy  Brief/Interim Summary: 83 year old female with complex past medical history, reported to the hospital with generalized weakness, frequent falls.  She was noted to be dehydrated and was admitted for further treatment.  Discharge Diagnoses:  Active Problems:   Hypothyroidism   DM type 2 causing complication (HCC)   Hyperlipidemia   Obesity   Essential hypertension   Arteriosclerotic cardiovascular disease (ASCVD)   Sleep apnea   Chronic obstructive pulmonary disease (HCC)   Noncompliance   Noncompliance with CPAP treatment   Gait instability   Anemia of chronic disease   Hyperglycemia   Generalized weakness   Fall at home   Symptomatic Yeast UTI   Leukocytosis   AKI (acute kidney injury) (HCC)  1. Left lower extremity weakness.  MRI imaging of brain did not reveal any underlying CVA.  She was seen by neurology and has undergone imaging of her cervical, thoracic and lumbar spine without any indications for myelopathy.  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  She will receive a total of 3 days of steroids.  She is also being treated with dual antiplatelet therapy for severe intracranial stenosis.  She does report some improvement of her symptoms.  She will need to follow-up with neurology in the next 4 weeks.  She needs daily physical therapy since she is high risk for fall due to her significant left lower extremity weakness/foot drop 2. Acute  generalized weakness- this was present on admission and initially improved with IV fluids. 3. Type 2 diabetes mellitus, insulin requiring with neurological complications-monitor blood glucose closely as she has had some hypoglycemia. Monitor blood glucose 5 times per day. Sliding scale coverage ordered.  4. AKI-RESOLVEDlikely prerenal and treated with IV hydration. 5. Fall at home-fall precautions recommended and PT recommending SNF.  Pt now agreeable.  Patient had another fall in the hospital while she was being helped to the bathroom with 2 person assist.  She did not have any head injury or any other significant injuries. 6. Hypothyroidism- TSH stable.  7. Anemia of chronic disease-stable. 8. Chronic gait instability-fall precautions and PT recommending SNF. 9. OSA-will offer nightly CPAP. 10. Essential hypertension-stable BP.  11. Chronic pain and opioid dependence - resumed home meds.Patient had a negative urine drug screen on 02/08/2019.  Discharge Instructions   Allergies as of 02/20/2019      Reactions   Codeine Itching      Medication List    STOP taking these medications   cephALEXin 500 MG capsule Commonly known as:  KEFLEX     TAKE these medications   amLODipine 5 MG tablet Commonly known as:  NORVASC Take 5 mg by mouth daily.   aspirin EC 81 MG tablet Take 1 tablet (81 mg total) by mouth daily.   benzonatate 200 MG capsule Commonly known as:  TESSALON Take 1 capsule (200 mg total) by mouth 3 (three) times daily as needed for cough.   citalopram 20 MG tablet Commonly known as:  CELEXA Take 1 tablet (20 mg total) by mouth daily.  gabapentin 300 MG capsule Commonly known as:  NEURONTIN Take 300 mg by mouth 2 (two) times daily.   insulin NPH-regular Human (70-30) 100 UNIT/ML injection Inject 45 Units into the skin 2 (two) times daily.   ipratropium-albuterol 0.5-2.5 (3) MG/3ML Soln Commonly known as:  DUONEB Inhale 3 mLs into the lungs 4 (four)  times daily as needed (for shortness of breath).   levothyroxine 25 MCG tablet Commonly known as:  SYNTHROID, LEVOTHROID Take 25 mcg by mouth daily.   lisinopril 10 MG tablet Commonly known as:  PRINIVIL,ZESTRIL Take 10 mg by mouth daily.   LORazepam 1 MG tablet Commonly known as:  ATIVAN Take 1 tablet (1 mg total) by mouth at bedtime.   metFORMIN 500 MG tablet Commonly known as:  GLUCOPHAGE Take 500 mg by mouth 2 (two) times daily with a meal.   nitroGLYCERIN 0.4 MG SL tablet Commonly known as:  NITROSTAT Place 0.4 mg under the tongue every 5 (five) minutes as needed for chest pain.   oxyCODONE 5 MG immediate release tablet Commonly known as:  Oxy IR/ROXICODONE Take 1 tablet (5 mg total) by mouth every 4 (four) hours as needed (Moderate pain).   pantoprazole 40 MG tablet Commonly known as:  PROTONIX Take 1 tablet (40 mg total) by mouth daily at 6 (six) AM. Start taking on:  February 21, 2019   simvastatin 20 MG tablet Commonly known as:  ZOCOR Take 20 mg by mouth daily.       Allergies  Allergen Reactions  . Codeine Itching    Consultations:  Neurology   Procedures/Studies: Dg Chest 2 View  Result Date: 02/08/2019 CLINICAL DATA:  Headache and dizziness. EXAM: CHEST - 2 VIEW COMPARISON:  01/13/2019 FINDINGS: The cardiac silhouette, mediastinal and hilar contours are within normal limits and stable. There are mild chronic bronchitic type lung changes with streaky areas of scarring change. No acute infiltrates, edema or effusions. The bony thorax is intact. IMPRESSION: Chronic lung changes but no acute pulmonary findings. Electronically Signed   By: Rudie Meyer M.D.   On: 02/08/2019 19:53   Ct Head Wo Contrast  Result Date: 02/12/2019 CLINICAL DATA:  Ataxia with stroke suspected EXAM: CT HEAD WITHOUT CONTRAST TECHNIQUE: Contiguous axial images were obtained from the base of the skull through the vertex without intravenous contrast. COMPARISON:  02/08/2019 FINDINGS:  Brain: No evidence of acute infarction, hemorrhage, hydrocephalus, extra-axial collection or mass lesion/mass effect. Mild for age cerebral volume loss. Chronic small vessel ischemia in the white matter. Vascular: No hyperdense vessel or unexpected calcification. Skull: Normal. Negative for fracture or focal lesion. Sinuses/Orbits: No acute finding. IMPRESSION: Senescent changes without acute finding. Electronically Signed   By: Marnee Spring M.D.   On: 02/12/2019 09:02   Ct Head Wo Contrast  Result Date: 02/08/2019 CLINICAL DATA:  83 year old female with a history of dizziness EXAM: CT HEAD WITHOUT CONTRAST TECHNIQUE: Contiguous axial images were obtained from the base of the skull through the vertex without intravenous contrast. COMPARISON:  MR 02/08/2019, CT 01/03/2019 FINDINGS: Brain: No acute intracranial hemorrhage. No midline shift or mass effect. Gray-white differentiation maintained. Unremarkable configuration of the ventricles. Confluent hypodensity in the bilateral periventricular white matter. Vascular: Intracranial atherosclerosis of anterior circulation Skull: No acute displaced fracture.  No aggressive bony lesions. Sinuses/Orbits: No acute finding. Other: None IMPRESSION: Negative for acute intracranial abnormality. Evidence of chronic microvascular ischemic disease Electronically Signed   By: Gilmer Mor D.O.   On: 02/08/2019 19:48   [name] Contrast  Result Date: 02/17/2019 CLINICAL DATA:  Left-sided weakness and fall. EXAM: MRI HEAD WITHOUT CONTRAST MRA HEAD WITHOUT CONTRAST TECHNIQUE: Multiplanar, multiecho pulse sequences of the brain and surrounding structures were obtained without intravenous contrast. Angiographic images of the head were obtained using MRA technique without contrast. COMPARISON:  MRA brain 11/02/2018 Head CT 02/17/2019 Brain MRI 02/08/2019 FINDINGS: MRI HEAD FINDINGS BRAIN: There is no acute infarct, acute hemorrhage or mass effect. The midline structures are  normal. Old right thalamic lacunar infarct. Early confluent hyperintense T2-weighted signal of the periventricular and deep white matter, most commonly due to chronic ischemic microangiopathy. Generalized atrophy without lobar predilection. Susceptibility-sensitive sequences show no chronic microhemorrhage or superficial siderosis. SKULL AND UPPER CERVICAL SPINE: The visualized skull base, calvarium, upper cervical spine and extracranial soft tissues are normal. SINUSES/ORBITS: No fluid levels or advanced mucosal thickening. No mastoid or middle ear effusion. The orbits are normal. MRA HEAD FINDINGS POSTERIOR CIRCULATION: --Basilar artery: Normal. --Posterior cerebral arteries: Multifocal severe stenosis of both P2 segments. The right PCA is predominantly supplied by the posterior communicating artery. --Superior cerebellar arteries: Normal. --Inferior cerebellar arteries: Normal anterior and posterior inferior cerebellar arteries. ANTERIOR CIRCULATION: --Intracranial internal carotid arteries: Normal. --Anterior cerebral arteries: Normal. Hypoplastic right A1 segment, normal variant. --Middle cerebral arteries: There are multiple moderate-to-severe stenoses of the proximal right M2 branches. There is severe stenosis of the distal left M1 and proximal M2 branches. The left M1 stenosis is new compared to 11/02/18. --Posterior communicating arteries: Right p-comm gives rise to the right PCA. No left p-comm is visualized. IMPRESSION: 1. No acute ischemic infarct. 2. Chronic ischemic microangiopathy and generalized atrophy. 3. New, severe stenosis of the distal left M1 MCA segment. Unchanged bilateral MCA M2 range moderate-to-severe stenoses. 4. Unchanged multifocal severe stenoses of both posterior cerebral artery P2 segments. Electronically Signed   By: Deatra Robinson M.D.   On: 02/17/2019 15:38   [name] Contrast (neuro Protocol)  Result Date: 02/08/2019 CLINICAL DATA:  Ataxia, vertigo EXAM: MRI HEAD WITHOUT  CONTRAST TECHNIQUE: Multiplanar, multiecho pulse sequences of the brain and surrounding structures were obtained without intravenous contrast. COMPARISON:  Brain MRI 11/02/2018 FINDINGS: BRAIN: There is no acute infarct, acute hemorrhage, hydrocephalus or extra-axial collection. The midline structures are normal. No midline shift or other mass effect. Diffuse confluent hyperintense T2-weighted signal within the periventricular, deep and juxtacortical white matter, most commonly due to chronic ischemic microangiopathy. Generalized atrophy without lobar predilection. Susceptibility-sensitive sequences show no chronic microhemorrhage or superficial siderosis. Small, old left cerebellar infarct and bilateral thalamic infarcts. VASCULAR: Major intracranial arterial and venous sinus flow voids are normal. SKULL AND UPPER CERVICAL SPINE: Calvarial bone marrow signal is normal. There is no skull base mass. Visualized upper cervical spine and soft tissues are normal. SINUSES/ORBITS: No fluid levels or advanced mucosal thickening. No mastoid or middle ear effusion. The orbits are normal. IMPRESSION: Chronic ischemic microangiopathy and generalized atrophy without acute intracranial abnormality. Electronically Signed   By: Deatra Robinson M.D.   On: 02/08/2019 18:36   Mr Cervical Spine Wo Contrast  Result Date: 02/18/2019 CLINICAL DATA:  Back pain and left lower extremity weakness over the last week. EXAM: MRI CERVICAL, THORACIC AND LUMBAR SPINE WITHOUT AND WITH CONTRAST TECHNIQUE: Multiplanar and multiecho pulse sequences of the cervical spine, to include the craniocervical junction and cervicothoracic junction, and thoracic and lumbar spine, were obtained without and with intravenous contrast. COMPARISON:  CT 02/12/2019.  Lumbar MRI 12/21/2018 CONTRAST:  10 cc Gadavist FINDINGS: MRI CERVICAL SPINE FINDINGS Alignment:  Normal Vertebrae: No fracture or primary bone lesion. Cord: No cord compression or primary cord lesion.  Posterior Fossa, vertebral arteries, paraspinal tissues: Negative Disc levels: No abnormality at C1-2, C2-3 or C3-4. C4-5: Right paracentral disc herniation with slight upward migration indents the ventral subarachnoid space but does not compress the cord or show foraminal extension. C5-6: Endplate osteophytes and bulging of the disc more prominent towards the right. No compressive canal stenosis. Right foraminal narrowing of a moderate degree. C6-7: Bulging of the disc and bilateral uncovertebral prominence. No compressive central canal stenosis. Mild bilateral foraminal narrowing. C7-T1: Bulging of the disc.  No canal or foraminal stenosis. MRI THORACIC SPINE FINDINGS Alignment:  No significant malalignment. Vertebrae: No fracture or primary bone lesion. Cord:  No primary cord lesion.  See below regarding stenosis. Paraspinal and other soft tissues: Negative Disc levels: T2-3: Mild noncompressive disc bulge. T4-5: Mild noncompressive disc bulge. T5-6: Shallow left posterolateral herniation without visible neural compression T6-7: Shallow central disc herniation without visible neural compression. T7-8: Central to left-sided disc herniation, contacting the left side of the cord. This could possibly be symptomatic. T8-9: Mild noncompressive disc bulge. T9-10: Mild noncompressive disc bulge. T10-11 through T12-L1: Mild noncompressive disc bulges. MRI LUMBAR SPINE FINDINGS Segmentation:  5 lumbar type vertebral bodies. Alignment: Straightening of the normal lumbar lordosis. Mild curvature convex to the left. Vertebrae: No fracture or primary bone lesion. Discogenic endplate marrow changes on the right at L3-4 and L4-5. Conus medullaris and cauda equina: Conus extends to the L1 level. Conus and cauda equina appear normal. Paraspinal and other soft tissues: Negative Disc levels: L1-2: Previous left hemilaminectomy. Broad-based disc herniation more prominent in the left posterolateral direction. This indents the thecal  sac but does not cause visible neural compression. L2-3: Previous left hemilaminectomy. Endplate osteophytes and bulging of the disc. No apparent compressive stenosis. Foraminal to extraforaminal encroachment on the right by osteophyte in disc does not appear to compress the L2 nerve, but could irritate that structure. L3-4: Endplate osteophytes and bulging of the disc. Mild facet and ligamentous hypertrophy. Mild stenosis of the lateral recesses and foramina but without definite neural compression. L4-5: Endplate osteophytes and broad-based protrusion of the disc. Mild facet and ligamentous hypertrophy. Stenosis of the lateral recesses and neural foramina that could cause neural compression on either or both sides. L5-S1: Mild bulging of the disc.  No canal or foraminal stenosis. IMPRESSION: Cervical region: No cord compression or primary cord lesion. Right paracentral disc herniation at C4-5 with slight upward migration, not apparently resulting in neural compression. Spondylosis at C5-6 and C6-7 with moderate right foraminal narrowing at C5-6 and mild bilateral foraminal narrowing at C6-7. Thoracic region: No fracture. No primary cord lesion. Degenerative spondylosis at multiple levels without apparent neural compression. Left posterolateral disc herniation at T7-8 which does contact the left side of the cord and could be symptomatic. Ample subarachnoid spaces present dorsal to cord however. Lumbar region: No change appreciated since December. Previous left-sided decompression at L1-2 and L2-3. No ongoing left-sided neural compression visible at those levels. L3-4: Mild stenosis of the lateral recesses and foramina but without definite neural compression. L4-5: Bilateral lateral recess and foraminal stenosis because of encroachment by osteophyte in disc material. Neural compression could occur on either side at this level. Electronically Signed   By: Paulina Fusi M.D.   On: 02/18/2019 09:44   Mr Thoracic Spine  Wo Contrast  Result Date: 02/18/2019 CLINICAL DATA:  Back pain and left lower extremity weakness over the  last week. EXAM: MRI CERVICAL, THORACIC AND LUMBAR SPINE WITHOUT AND WITH CONTRAST TECHNIQUE: Multiplanar and multiecho pulse sequences of the cervical spine, to include the craniocervical junction and cervicothoracic junction, and thoracic and lumbar spine, were obtained without and with intravenous contrast. COMPARISON:  CT 02/12/2019.  Lumbar MRI 12/21/2018 CONTRAST:  10 cc Gadavist FINDINGS: MRI CERVICAL SPINE FINDINGS Alignment: Normal Vertebrae: No fracture or primary bone lesion. Cord: No cord compression or primary cord lesion. Posterior Fossa, vertebral arteries, paraspinal tissues: Negative Disc levels: No abnormality at C1-2, C2-3 or C3-4. C4-5: Right paracentral disc herniation with slight upward migration indents the ventral subarachnoid space but does not compress the cord or show foraminal extension. C5-6: Endplate osteophytes and bulging of the disc more prominent towards the right. No compressive canal stenosis. Right foraminal narrowing of a moderate degree. C6-7: Bulging of the disc and bilateral uncovertebral prominence. No compressive central canal stenosis. Mild bilateral foraminal narrowing. C7-T1: Bulging of the disc.  No canal or foraminal stenosis. MRI THORACIC SPINE FINDINGS Alignment:  No significant malalignment. Vertebrae: No fracture or primary bone lesion. Cord:  No primary cord lesion.  See below regarding stenosis. Paraspinal and other soft tissues: Negative Disc levels: T2-3: Mild noncompressive disc bulge. T4-5: Mild noncompressive disc bulge. T5-6: Shallow left posterolateral herniation without visible neural compression T6-7: Shallow central disc herniation without visible neural compression. T7-8: Central to left-sided disc herniation, contacting the left side of the cord. This could possibly be symptomatic. T8-9: Mild noncompressive disc bulge. T9-10: Mild noncompressive  disc bulge. T10-11 through T12-L1: Mild noncompressive disc bulges. MRI LUMBAR SPINE FINDINGS Segmentation:  5 lumbar type vertebral bodies. Alignment: Straightening of the normal lumbar lordosis. Mild curvature convex to the left. Vertebrae: No fracture or primary bone lesion. Discogenic endplate marrow changes on the right at L3-4 and L4-5. Conus medullaris and cauda equina: Conus extends to the L1 level. Conus and cauda equina appear normal. Paraspinal and other soft tissues: Negative Disc levels: L1-2: Previous left hemilaminectomy. Broad-based disc herniation more prominent in the left posterolateral direction. This indents the thecal sac but does not cause visible neural compression. L2-3: Previous left hemilaminectomy. Endplate osteophytes and bulging of the disc. No apparent compressive stenosis. Foraminal to extraforaminal encroachment on the right by osteophyte in disc does not appear to compress the L2 nerve, but could irritate that structure. L3-4: Endplate osteophytes and bulging of the disc. Mild facet and ligamentous hypertrophy. Mild stenosis of the lateral recesses and foramina but without definite neural compression. L4-5: Endplate osteophytes and broad-based protrusion of the disc. Mild facet and ligamentous hypertrophy. Stenosis of the lateral recesses and neural foramina that could cause neural compression on either or both sides. L5-S1: Mild bulging of the disc.  No canal or foraminal stenosis. IMPRESSION: Cervical region: No cord compression or primary cord lesion. Right paracentral disc herniation at C4-5 with slight upward migration, not apparently resulting in neural compression. Spondylosis at C5-6 and C6-7 with moderate right foraminal narrowing at C5-6 and mild bilateral foraminal narrowing at C6-7. Thoracic region: No fracture. No primary cord lesion. Degenerative spondylosis at multiple levels without apparent neural compression. Left posterolateral disc herniation at T7-8 which does  contact the left side of the cord and could be symptomatic. Ample subarachnoid spaces present dorsal to cord however. Lumbar region: No change appreciated since December. Previous left-sided decompression at L1-2 and L2-3. No ongoing left-sided neural compression visible at those levels. L3-4: Mild stenosis of the lateral recesses and foramina but without definite neural compression. L4-5:  Bilateral lateral recess and foraminal stenosis because of encroachment by osteophyte in disc material. Neural compression could occur on either side at this level. Electronically Signed   By: Paulina Fusi M.D.   On: 02/18/2019 09:44   Mr Lumbar Spine W Wo Contrast  Result Date: 02/18/2019 CLINICAL DATA:  Back pain and left lower extremity weakness over the last week. EXAM: MRI CERVICAL, THORACIC AND LUMBAR SPINE WITHOUT AND WITH CONTRAST TECHNIQUE: Multiplanar and multiecho pulse sequences of the cervical spine, to include the craniocervical junction and cervicothoracic junction, and thoracic and lumbar spine, were obtained without and with intravenous contrast. COMPARISON:  CT 02/12/2019.  Lumbar MRI 12/21/2018 CONTRAST:  10 cc Gadavist FINDINGS: MRI CERVICAL SPINE FINDINGS Alignment: Normal Vertebrae: No fracture or primary bone lesion. Cord: No cord compression or primary cord lesion. Posterior Fossa, vertebral arteries, paraspinal tissues: Negative Disc levels: No abnormality at C1-2, C2-3 or C3-4. C4-5: Right paracentral disc herniation with slight upward migration indents the ventral subarachnoid space but does not compress the cord or show foraminal extension. C5-6: Endplate osteophytes and bulging of the disc more prominent towards the right. No compressive canal stenosis. Right foraminal narrowing of a moderate degree. C6-7: Bulging of the disc and bilateral uncovertebral prominence. No compressive central canal stenosis. Mild bilateral foraminal narrowing. C7-T1: Bulging of the disc.  No canal or foraminal stenosis.  MRI THORACIC SPINE FINDINGS Alignment:  No significant malalignment. Vertebrae: No fracture or primary bone lesion. Cord:  No primary cord lesion.  See below regarding stenosis. Paraspinal and other soft tissues: Negative Disc levels: T2-3: Mild noncompressive disc bulge. T4-5: Mild noncompressive disc bulge. T5-6: Shallow left posterolateral herniation without visible neural compression T6-7: Shallow central disc herniation without visible neural compression. T7-8: Central to left-sided disc herniation, contacting the left side of the cord. This could possibly be symptomatic. T8-9: Mild noncompressive disc bulge. T9-10: Mild noncompressive disc bulge. T10-11 through T12-L1: Mild noncompressive disc bulges. MRI LUMBAR SPINE FINDINGS Segmentation:  5 lumbar type vertebral bodies. Alignment: Straightening of the normal lumbar lordosis. Mild curvature convex to the left. Vertebrae: No fracture or primary bone lesion. Discogenic endplate marrow changes on the right at L3-4 and L4-5. Conus medullaris and cauda equina: Conus extends to the L1 level. Conus and cauda equina appear normal. Paraspinal and other soft tissues: Negative Disc levels: L1-2: Previous left hemilaminectomy. Broad-based disc herniation more prominent in the left posterolateral direction. This indents the thecal sac but does not cause visible neural compression. L2-3: Previous left hemilaminectomy. Endplate osteophytes and bulging of the disc. No apparent compressive stenosis. Foraminal to extraforaminal encroachment on the right by osteophyte in disc does not appear to compress the L2 nerve, but could irritate that structure. L3-4: Endplate osteophytes and bulging of the disc. Mild facet and ligamentous hypertrophy. Mild stenosis of the lateral recesses and foramina but without definite neural compression. L4-5: Endplate osteophytes and broad-based protrusion of the disc. Mild facet and ligamentous hypertrophy. Stenosis of the lateral recesses and  neural foramina that could cause neural compression on either or both sides. L5-S1: Mild bulging of the disc.  No canal or foraminal stenosis. IMPRESSION: Cervical region: No cord compression or primary cord lesion. Right paracentral disc herniation at C4-5 with slight upward migration, not apparently resulting in neural compression. Spondylosis at C5-6 and C6-7 with moderate right foraminal narrowing at C5-6 and mild bilateral foraminal narrowing at C6-7. Thoracic region: No fracture. No primary cord lesion. Degenerative spondylosis at multiple levels without apparent neural compression. Left posterolateral disc  herniation at T7-8 which does contact the left side of the cord and could be symptomatic. Ample subarachnoid spaces present dorsal to cord however. Lumbar region: No change appreciated since December. Previous left-sided decompression at L1-2 and L2-3. No ongoing left-sided neural compression visible at those levels. L3-4: Mild stenosis of the lateral recesses and foramina but without definite neural compression. L4-5: Bilateral lateral recess and foraminal stenosis because of encroachment by osteophyte in disc material. Neural compression could occur on either side at this level. Electronically Signed   By: Paulina Fusi M.D.   On: 02/18/2019 09:44   US Carotid Bilateral (at [facility] Only)  Result Date: 02/17/2019 CLINICAL DATA:  Stroke for 12 hours EXAM: BILATERAL CAROTID DUPLEX ULTRASOUND TECHNIQUE: Wallace Cullens scale imaging, color Doppler and duplex ultrasound were performed of bilateral carotid and vertebral arteries in the neck. COMPARISON:  08/02/2016 FINDINGS: Criteria: Quantification of carotid stenosis is based on velocity parameters that correlate the residual internal carotid diameter with NASCET-based stenosis levels, using the diameter of the distal internal carotid lumen as the denominator for stenosis measurement. The following velocity measurements were obtained: RIGHT ICA: 101 cm/sec CCA:  138 cm/sec SYSTOLIC ICA/CCA RATIO:  0.7 ECA: 120 cm/sec LEFT ICA: 72 cm/sec CCA: 67 cm/sec SYSTOLIC ICA/CCA RATIO:  1.1 ECA: 141 cm/sec RIGHT CAROTID ARTERY: Mild irregular calcified plaque in the bulb. Low resistance internal carotid Doppler pattern is preserved. RIGHT VERTEBRAL ARTERY:  Antegrade. LEFT CAROTID ARTERY: Moderate predominately smooth calcified plaque in the bulb. Low resistance internal carotid Doppler pattern is preserved. LEFT VERTEBRAL ARTERY:  Antegrade. IMPRESSION: Less than 50% stenosis in the right and left internal carotid arteries. Antegrade vertebral arteries bilaterally. Electronically Signed   By: Jolaine Click M.D.   On: 02/17/2019 16:01   Dg Chest Portable 1 View  Result Date: 02/12/2019 CLINICAL DATA:  Status post fall this morning getting out of bed. EXAM: PORTABLE CHEST 1 VIEW COMPARISON:  PA and lateral chest 02/08/2019 and 02/28/2018. FINDINGS: The patient is status post CABG. Heart size is upper normal. Mild discoid atelectasis left lung base noted. Lungs otherwise clear. No pneumothorax or pleural effusion. No acute bony abnormality. There is partial visualization of fixation of a right humerus fracture and rotator cuff repair on the right. IMPRESSION: No acute disease. Electronically Signed   By: Drusilla Kanner M.D.   On: 02/12/2019 08:53   [name] Wo Contrast  Result Date: 02/17/2019 CLINICAL DATA:  Left-sided weakness and fall. EXAM: MRI HEAD WITHOUT CONTRAST MRA HEAD WITHOUT CONTRAST TECHNIQUE: Multiplanar, multiecho pulse sequences of the brain and surrounding structures were obtained without intravenous contrast. Angiographic images of the head were obtained using MRA technique without contrast. COMPARISON:  MRA brain 11/02/2018 Head CT 02/17/2019 Brain MRI 02/08/2019 FINDINGS: MRI HEAD FINDINGS BRAIN: There is no acute infarct, acute hemorrhage or mass effect. The midline structures are normal. Old right thalamic lacunar infarct. Early confluent hyperintense  T2-weighted signal of the periventricular and deep white matter, most commonly due to chronic ischemic microangiopathy. Generalized atrophy without lobar predilection. Susceptibility-sensitive sequences show no chronic microhemorrhage or superficial siderosis. SKULL AND UPPER CERVICAL SPINE: The visualized skull base, calvarium, upper cervical spine and extracranial soft tissues are normal. SINUSES/ORBITS: No fluid levels or advanced mucosal thickening. No mastoid or middle ear effusion. The orbits are normal. MRA HEAD FINDINGS POSTERIOR CIRCULATION: --Basilar artery: Normal. --Posterior cerebral arteries: Multifocal severe stenosis of both P2 segments. The right PCA is predominantly supplied by the posterior communicating artery. --Superior cerebellar arteries: Normal. --  Inferior cerebellar arteries: Normal anterior and posterior inferior cerebellar arteries. ANTERIOR CIRCULATION: --Intracranial internal carotid arteries: Normal. --Anterior cerebral arteries: Normal. Hypoplastic right A1 segment, normal variant. --Middle cerebral arteries: There are multiple moderate-to-severe stenoses of the proximal right M2 branches. There is severe stenosis of the distal left M1 and proximal M2 branches. The left M1 stenosis is new compared to 11/02/18. --Posterior communicating arteries: Right p-comm gives rise to the right PCA. No left p-comm is visualized. IMPRESSION: 1. No acute ischemic infarct. 2. Chronic ischemic microangiopathy and generalized atrophy. 3. New, severe stenosis of the distal left M1 MCA segment. Unchanged bilateral MCA M2 range moderate-to-severe stenoses. 4. Unchanged multifocal severe stenoses of both posterior cerebral artery P2 segments. Electronically Signed   By: Deatra Robinson M.D.   On: 02/17/2019 15:38   Ct Head Code Stroke Wo Contrast  Result Date: 02/17/2019 CLINICAL DATA:  Code stroke. Fall today. New onset left lower extremity weakness. EXAM: CT HEAD WITHOUT CONTRAST TECHNIQUE: Contiguous  axial images were obtained from the base of the skull through the vertex without intravenous contrast. COMPARISON:  CT head without contrast 02/12/2019. MRI brain 02/08/2019 FINDINGS: Brain: Moderate atrophy and white matter disease is stable. Basal ganglia are intact. Insular ribbon is normal bilaterally. No acute or focal cortical abnormalities are present. The ventricles are of proportionate to the degree of atrophy. No significant extraaxial fluid collection is present. The brainstem and cerebellum are within normal limits. Vascular: Atherosclerotic calcifications are again noted within the cavernous internal carotid arteries. There is no hyperdense vessel. Skull: Calvarium is intact. No focal lytic or blastic lesions are present. Sinuses/Orbits: Chronic right sphenoid sinus disease is present. The paranasal sinuses are otherwise clear. Bilateral lens replacements are noted. Globes and orbits are otherwise unremarkable. ASPECTS [facility] Stroke Program Early CT Score) - Ganglionic level infarction (caudate, lentiform nuclei, internal capsule, insula, M1-M3 cortex): 7/7 - Supraganglionic infarction (M4-M6 cortex): 3/3 Total score (0-10 with 10 being normal): 10/10 IMPRESSION: 1. No acute intracranial abnormality or significant interval change. 2. Stable moderate atrophy and white matter disease. 3. ASPECTS is 10/10 These results were called by telephone at the time of interpretation on 02/17/2019 at 1:28 pm to Dr. Durward Mallard [facility] , who verbally acknowledged these results. Electronically Signed   By: Marin Roberts M.D.   On: 02/17/2019 13:28       Subjective: Feels slight left leg weakness is about the same.  No shortness of breath  Discharge Exam: Vitals:   02/20/19 1110 02/20/19 1331 02/20/19 1510 02/20/19 1730  BP: 138/72 134/62 135/64 (!) 149/54  Pulse: 72 73 62 85  Resp: 19 19 20 20   Temp: 98 F (36.7 C) 98.5 F (36.9 C) 98.3 F (36.8 C)   TempSrc: Oral  Oral   SpO2: 96% 96% 96% 100%   Weight:      Height:        General: Pt is alert, awake, not in acute distress Cardiovascular: RRR, S1/S2 +, no rubs, no gallops Respiratory: CTA bilaterally, no wheezing, no rhonchi Abdominal: Soft, NT, ND, bowel sounds + Extremities: no edema, no cyanosis Neuro: Overall left leg weakness is mildly better, but continues to have left foot drop    The results of significant diagnostics from this hospitalization (including imaging, microbiology, ancillary and laboratory) are listed below for reference.     Microbiology: Recent Results (from the past 240 hour(s))  Culture, blood (Routine X 2) w Reflex to ID Panel     Status: None   Collection Time:  02/12/19  3:02 PM  Result Value Ref Range Status   Specimen Description BLOOD LEFT ANTECUBITAL  Final   Special Requests   Final    BOTTLES DRAWN AEROBIC AND ANAEROBIC Blood Culture adequate volume   Culture   Final    NO GROWTH 5 DAYS Performed at [facility], [address], [place], [place] [address]    Report Status 02/17/2019 FINAL  Final  Culture, blood (Routine X 2) w Reflex to ID Panel     Status: None   Collection Time: 02/12/19  3:03 PM  Result Value Ref Range Status   Specimen Description BLOOD BLOOD LEFT HAND  Final   Special Requests   Final    BOTTLES DRAWN AEROBIC AND ANAEROBIC Blood Culture adequate volume   Culture   Final    NO GROWTH 5 DAYS Performed at [facility], [address], [place], [place] [address]    Report Status 02/17/2019 FINAL  Final  Urine Culture     Status: None   Collection Time: 02/12/19 11:45 PM  Result Value Ref Range Status   Specimen Description   Final    URINE, CLEAN CATCH Performed at [facility], [address], [place], [place] [address]    Special Requests   Final    NONE Performed at [facility], [address] [place], [place] [address]    Culture   Final    NO GROWTH Performed at [facility], [address], [place], [place] [address]    Report Status  02/14/2019 FINAL  Final     Labs: BNP (last 3 results) Recent Labs    02/28/18 1708 09/13/18 0026 12/13/18 0419  BNP 72.0 63.0 475.0*   Basic Metabolic Panel: Recent Labs  Lab 02/17/19 1313 02/19/19 0550 02/19/19 1133 02/20/19 0649  NA 136  --   --  139  K 4.1  --   --  4.0  CL 103  --   --  108  CO2 26  --   --  23  GLUCOSE 213*  --  435* 279*  BUN 12  --   --  19  CREATININE 0.88 0.70  --  0.70  CALCIUM 9.1  --   --  9.0   Liver Function Tests: Recent Labs  Lab 02/17/19 1313  AST 16  ALT 18  ALKPHOS 64  BILITOT 0.4  PROT 6.4*  ALBUMIN 3.4*   No results for input(s): LIPASE, AMYLASE in the last 168 hours. No results for input(s): AMMONIA in the last 168 hours. CBC: Recent Labs  Lab 02/17/19 1313  WBC 9.9  HGB 11.7*  HCT 40.6  MCV 81.2  PLT 308   Cardiac Enzymes: No results for input(s): CKTOTAL, CKMB, CKMBINDEX, TROPONINI in the last 168 hours. BNP: Invalid input(s): POCBNP CBG: Recent Labs  Lab 02/19/19 2136 02/20/19 0307 02/20/19 0741 02/20/19 1112 02/20/19 1602  GLUCAP 195* 300* 312* 254* 234*   D-Dimer No results for input(s): DDIMER in the last 72 hours. Hgb A1c Recent Labs    02/18/19 0637  HGBA1C 8.5*   Lipid Profile Recent Labs    02/18/19 0637  CHOL 166  HDL 42  LDLCALC 106*  TRIG 90  CHOLHDL 4.0   Thyroid function studies No results for input(s): TSH, T4TOTAL, T3FREE, THYROIDAB in the last 72 hours.  Invalid input(s): FREET3 Anemia work up No results for input(s): VITAMINB12, FOLATE, FERRITIN, TIBC, IRON, RETICCTPCT in the last 72 hours. Urinalysis    Component Value  Date/Time   COLORURINE YELLOW 02/12/2019 0823   APPEARANCEUR CLOUDY (A) 02/12/2019 0823   LABSPEC 1.024 02/12/2019 0823   PHURINE 5.0 02/12/2019 0823   GLUCOSEU 150 (A) 02/12/2019 0823   HGBUR NEGATIVE 02/12/2019 0823   BILIRUBINUR NEGATIVE 02/12/2019 0823   KETONESUR NEGATIVE 02/12/2019 0823   PROTEINUR 100 (A) 02/12/2019 0823   UROBILINOGEN  0.2 08/29/2015 1842   NITRITE NEGATIVE 02/12/2019 0823   LEUKOCYTESUR LARGE (A) 02/12/2019 0823   Sepsis Labs Invalid input(s): PROCALCITONIN,  WBC,  LACTICIDVEN Microbiology Recent Results (from the past 240 hour(s))  Culture, blood (Routine X 2) w Reflex to ID Panel     Status: None   Collection Time: 02/12/19  3:02 PM  Result Value Ref Range Status   Specimen Description BLOOD LEFT ANTECUBITAL  Final   Special Requests   Final    BOTTLES DRAWN AEROBIC AND ANAEROBIC Blood Culture adequate volume   Culture   Final    NO GROWTH 5 DAYS Performed at [facility], [address], [place], [place] [address]    Report Status 02/17/2019 FINAL  Final  Culture, blood (Routine X 2) w Reflex to ID Panel     Status: None   Collection Time: 02/12/19  3:03 PM  Result Value Ref Range Status   Specimen Description BLOOD BLOOD LEFT HAND  Final   Special Requests   Final    BOTTLES DRAWN AEROBIC AND ANAEROBIC Blood Culture adequate volume   Culture   Final    NO GROWTH 5 DAYS Performed at [facility], [address] [place], [place] [address]    Report Status 02/17/2019 FINAL  Final  Urine Culture     Status: None   Collection Time: 02/12/19 11:45 PM  Result Value Ref Range Status   Specimen Description   Final    URINE, CLEAN CATCH Performed at [facility], [address], [place], [place] [address]    Special Requests   Final    NONE Performed at [facility], [address] [place], [place] [address]    Culture   Final    NO GROWTH Performed at [facility], [address] [place], [place] [address]    Report Status 02/14/2019 FINAL  Final     Time coordinating discharge:  SIGNED:   Erick Blinks, MD  [facility] 02/20/2019, 6:36 PM   If 7PM-7AM, please contact night-coverage [email or web address]

## 2019-02-20 NOTE — Progress Notes (Signed)
Patient will not be wearing CPAP tonight she wears nasal prongs type mask. Stated she would wear that but not to worry about tonight will try tomorrow -- Patient has been hear a week

## 2019-02-21 LAB — GLUCOSE, CAPILLARY
GLUCOSE-CAPILLARY: 228 mg/dL — AB (ref 70–99)
Glucose-Capillary: 264 mg/dL — ABNORMAL HIGH (ref 70–99)
Glucose-Capillary: 349 mg/dL — ABNORMAL HIGH (ref 70–99)
Glucose-Capillary: 306 mg/dL — ABNORMAL HIGH (ref 70–99)

## 2019-02-21 MED ORDER — INSULIN ASPART PROT & ASPART (70-30 MIX) 100 UNIT/ML ~~LOC~~ SUSP
40.0000 [IU] | Freq: Two times a day (BID) | SUBCUTANEOUS | Status: DC
  Administered 2019-02-21 – 2019-02-22 (×3): 40 [IU] via SUBCUTANEOUS
  Filled 2019-02-21: qty 10

## 2019-02-21 NOTE — Progress Notes (Signed)
LCSW called [facility] to follow up with [facility] Berkley Harvey as it was not in as of late Friday pm. [facility] and spoke to [employer] and she has not received an authorization as of today and was advised their SW will be in first thing Monday to review.  [employer] LCSW [phone]

## 2019-02-21 NOTE — Progress Notes (Signed)
Patient not using CPAP

## 2019-02-21 NOTE — Progress Notes (Signed)
02/21/2019 1:30 PM  Pt was seen and examined.  Pt currently awaiting insurance authorization for discharge to SNF.  See dictated discharge summary from 2/29.  Continue current management.   Maryln Manuel MD

## 2019-02-22 ENCOUNTER — Encounter (HOSPITAL_COMMUNITY): Payer: Self-pay | Admitting: *Deleted

## 2019-02-22 ENCOUNTER — Observation Stay (HOSPITAL_COMMUNITY)
Admission: EM | Admit: 2019-02-22 | Discharge: 2019-02-26 | Disposition: A | Discharge status: Home / Self Care | Payer: Medicare HMO | Attending: Family Medicine | Admitting: Family Medicine

## 2019-02-22 ENCOUNTER — Other Ambulatory Visit: Payer: Self-pay

## 2019-02-22 DIAGNOSIS — M549 Dorsalgia, unspecified: Secondary | ICD-10-CM | POA: Insufficient documentation

## 2019-02-22 DIAGNOSIS — E118 Type 2 diabetes mellitus with unspecified complications: Secondary | ICD-10-CM | POA: Diagnosis present

## 2019-02-22 DIAGNOSIS — G8929 Other chronic pain: Secondary | ICD-10-CM | POA: Diagnosis present

## 2019-02-22 DIAGNOSIS — I25708 Atherosclerosis of coronary artery bypass graft(s), unspecified, with other forms of angina pectoris: Secondary | ICD-10-CM

## 2019-02-22 DIAGNOSIS — M5440 Lumbago with sciatica, unspecified side: Secondary | ICD-10-CM | POA: Diagnosis not present

## 2019-02-22 DIAGNOSIS — E02 Subclinical iodine-deficiency hypothyroidism: Secondary | ICD-10-CM

## 2019-02-22 DIAGNOSIS — J410 Simple chronic bronchitis: Secondary | ICD-10-CM | POA: Diagnosis not present

## 2019-02-22 DIAGNOSIS — R5381 Other malaise: Principal | ICD-10-CM | POA: Insufficient documentation

## 2019-02-22 DIAGNOSIS — G4733 Obstructive sleep apnea (adult) (pediatric): Secondary | ICD-10-CM | POA: Diagnosis present

## 2019-02-22 DIAGNOSIS — B3749 Other urogenital candidiasis: Secondary | ICD-10-CM | POA: Diagnosis present

## 2019-02-22 DIAGNOSIS — Z951 Presence of aortocoronary bypass graft: Secondary | ICD-10-CM | POA: Insufficient documentation

## 2019-02-22 DIAGNOSIS — I10 Essential (primary) hypertension: Secondary | ICD-10-CM | POA: Diagnosis present

## 2019-02-22 DIAGNOSIS — R531 Weakness: Secondary | ICD-10-CM

## 2019-02-22 DIAGNOSIS — N390 Urinary tract infection, site not specified: Secondary | ICD-10-CM | POA: Insufficient documentation

## 2019-02-22 DIAGNOSIS — Z79899 Other long term (current) drug therapy: Secondary | ICD-10-CM | POA: Insufficient documentation

## 2019-02-22 DIAGNOSIS — I2510 Atherosclerotic heart disease of native coronary artery without angina pectoris: Secondary | ICD-10-CM | POA: Diagnosis present

## 2019-02-22 DIAGNOSIS — E11649 Type 2 diabetes mellitus with hypoglycemia without coma: Secondary | ICD-10-CM | POA: Insufficient documentation

## 2019-02-22 DIAGNOSIS — E162 Hypoglycemia, unspecified: Secondary | ICD-10-CM | POA: Diagnosis not present

## 2019-02-22 DIAGNOSIS — E039 Hypothyroidism, unspecified: Secondary | ICD-10-CM | POA: Diagnosis present

## 2019-02-22 DIAGNOSIS — Z794 Long term (current) use of insulin: Secondary | ICD-10-CM | POA: Insufficient documentation

## 2019-02-22 DIAGNOSIS — J449 Chronic obstructive pulmonary disease, unspecified: Secondary | ICD-10-CM | POA: Diagnosis present

## 2019-02-22 LAB — BASIC METABOLIC PANEL
Anion gap: 7 (ref 5–15)
BUN: 28 mg/dL — ABNORMAL HIGH (ref 8–23)
CHLORIDE: 102 mmol/L (ref 98–111)
CO2: 28 mmol/L (ref 22–32)
CREATININE: 0.79 mg/dL (ref 0.44–1.00)
Calcium: 9.3 mg/dL (ref 8.9–10.3)
GFR calc Af Amer: >60 mL/min (ref >60)
GFR calc non Af Amer: >60 mL/min (ref >60)
Glucose, Bld: 54 mg/dL — ABNORMAL LOW (ref 70–99)
Potassium: 4.2 mmol/L (ref 3.5–5.1)
Sodium: 137 mmol/L (ref 135–145)

## 2019-02-22 LAB — CBC WITH DIFFERENTIAL/PLATELET
Abs Immature Granulocytes: 0.16 10*3/uL — ABNORMAL HIGH (ref 0.00–0.07)
Basophils Absolute: 0 10*3/uL (ref 0.0–0.1)
Basophils Relative: 0 %
Eosinophils Absolute: 0.1 10*3/uL (ref 0.0–0.5)
Eosinophils Relative: 1 %
HCT: 43.2 % (ref 36.0–46.0)
Hemoglobin: 12.7 g/dL (ref 12.0–15.0)
Immature Granulocytes: 1 %
LYMPHS PCT: 25 %
Lymphs Abs: 3.4 10*3/uL (ref 0.7–4.0)
MCH: 23.7 pg — ABNORMAL LOW (ref 26.0–34.0)
MCHC: 29.4 g/dL — AB (ref 30.0–36.0)
MCV: 80.7 fL (ref 80.0–100.0)
Monocytes Absolute: 1.6 10*3/uL — ABNORMAL HIGH (ref 0.1–1.0)
Monocytes Relative: 12 %
Neutro Abs: 8.4 10*3/uL — ABNORMAL HIGH (ref 1.7–7.7)
Neutrophils Relative %: 61 %
Platelets: 369 10*3/uL (ref 150–400)
RBC: 5.35 MIL/uL — ABNORMAL HIGH (ref 3.87–5.11)
RDW: 16.7 % — ABNORMAL HIGH (ref 11.5–15.5)
WBC: 13.7 10*3/uL — ABNORMAL HIGH (ref 4.0–10.5)
nRBC: 0 % (ref 0.0–0.2)

## 2019-02-22 LAB — GLUCOSE, CAPILLARY
GLUCOSE-CAPILLARY: 163 mg/dL — AB (ref 70–99)
Glucose-Capillary: 123 mg/dL — ABNORMAL HIGH (ref 70–99)
Glucose-Capillary: 172 mg/dL — ABNORMAL HIGH (ref 70–99)
Glucose-Capillary: 346 mg/dL — ABNORMAL HIGH (ref 70–99)

## 2019-02-22 LAB — CBG MONITORING, ED
Glucose-Capillary: 56 mg/dL — ABNORMAL LOW (ref 70–99)
Glucose-Capillary: 84 mg/dL (ref 70–99)

## 2019-02-22 MED ORDER — SODIUM CHLORIDE 0.9 % IV SOLN
INTRAVENOUS | Status: AC
  Administered 2019-02-23: via INTRAVENOUS

## 2019-02-22 MED ORDER — LEVOTHYROXINE SODIUM 25 MCG PO TABS
25.0000 ug | ORAL_TABLET | Freq: Every day | ORAL | Status: DC
  Administered 2019-02-23 – 2019-02-26 (×4): 25 ug via ORAL
  Filled 2019-02-22 (×4): qty 1

## 2019-02-22 MED ORDER — INSULIN ASPART 100 UNIT/ML ~~LOC~~ SOLN
0.0000 [IU] | Freq: Three times a day (TID) | SUBCUTANEOUS | Status: DC
  Administered 2019-02-23: 3 [IU] via SUBCUTANEOUS
  Administered 2019-02-23: 2 [IU] via SUBCUTANEOUS
  Administered 2019-02-24: 3 [IU] via SUBCUTANEOUS
  Administered 2019-02-24: 8 [IU] via SUBCUTANEOUS
  Administered 2019-02-24: 3 [IU] via SUBCUTANEOUS
  Administered 2019-02-25: 11 [IU] via SUBCUTANEOUS
  Administered 2019-02-25 – 2019-02-26 (×2): 3 [IU] via SUBCUTANEOUS
  Administered 2019-02-26: 11 [IU] via SUBCUTANEOUS

## 2019-02-22 MED ORDER — BENZONATATE 100 MG PO CAPS: 200.0000 mg | ORAL_CAPSULE | Freq: Three times a day (TID) | ORAL | Status: DC | PRN

## 2019-02-22 MED ORDER — GABAPENTIN 300 MG PO CAPS
300.0000 mg | ORAL_CAPSULE | Freq: Two times a day (BID) | ORAL | Status: DC
  Administered 2019-02-23 – 2019-02-26 (×8): 300 mg via ORAL
  Filled 2019-02-22 (×8): qty 1

## 2019-02-22 MED ORDER — OXYCODONE HCL 5 MG PO TABS
5.0000 mg | ORAL_TABLET | ORAL | Status: DC | PRN
  Administered 2019-02-23 – 2019-02-26 (×8): 5 mg via ORAL
  Filled 2019-02-22 (×9): qty 1

## 2019-02-22 MED ORDER — ACETAMINOPHEN 650 MG RE SUPP: 650.0000 mg | Freq: Four times a day (QID) | RECTAL | Status: DC | PRN

## 2019-02-22 MED ORDER — AMLODIPINE BESYLATE 5 MG PO TABS
5.0000 mg | ORAL_TABLET | Freq: Every day | ORAL | Status: DC
  Administered 2019-02-23 – 2019-02-26 (×4): 5 mg via ORAL
  Filled 2019-02-22 (×4): qty 1

## 2019-02-22 MED ORDER — CLOPIDOGREL BISULFATE 75 MG PO TABS
75.0000 mg | ORAL_TABLET | Freq: Every day | ORAL | Status: DC
  Administered 2019-02-23 – 2019-02-26 (×4): 75 mg via ORAL
  Filled 2019-02-22 (×4): qty 1

## 2019-02-22 MED ORDER — ONDANSETRON HCL 4 MG PO TABS: 4.0000 mg | ORAL_TABLET | Freq: Four times a day (QID) | ORAL | Status: DC | PRN

## 2019-02-22 MED ORDER — ASPIRIN EC 81 MG PO TBEC
81.0000 mg | DELAYED_RELEASE_TABLET | Freq: Every day | ORAL | Status: DC
  Administered 2019-02-23 – 2019-02-26 (×4): 81 mg via ORAL
  Filled 2019-02-22 (×4): qty 1

## 2019-02-22 MED ORDER — FLUCONAZOLE 100 MG PO TABS
200.0000 mg | ORAL_TABLET | Freq: Every day | ORAL | Status: DC
  Administered 2019-02-23 – 2019-02-26 (×4): 200 mg via ORAL
  Filled 2019-02-22 (×4): qty 2

## 2019-02-22 MED ORDER — ONDANSETRON HCL 4 MG/2ML IJ SOLN: 4.0000 mg | Freq: Four times a day (QID) | INTRAMUSCULAR | Status: DC | PRN

## 2019-02-22 MED ORDER — IPRATROPIUM-ALBUTEROL 0.5-2.5 (3) MG/3ML IN SOLN: 3.0000 mL | Freq: Four times a day (QID) | RESPIRATORY_TRACT | Status: DC | PRN

## 2019-02-22 MED ORDER — LISINOPRIL 10 MG PO TABS
10.0000 mg | ORAL_TABLET | Freq: Every day | ORAL | Status: DC
  Administered 2019-02-23 – 2019-02-26 (×4): 10 mg via ORAL
  Filled 2019-02-22 (×4): qty 1

## 2019-02-22 MED ORDER — POLYETHYLENE GLYCOL 3350 17 G PO PACK
17.0000 g | PACK | Freq: Every day | ORAL | Status: DC | PRN
  Administered 2019-02-24 – 2019-02-26 (×3): 17 g via ORAL
  Filled 2019-02-22 (×3): qty 1

## 2019-02-22 MED ORDER — ENOXAPARIN SODIUM 40 MG/0.4ML ~~LOC~~ SOLN
40.0000 mg | SUBCUTANEOUS | Status: DC
  Administered 2019-02-23 – 2019-02-26 (×4): 40 mg via SUBCUTANEOUS
  Filled 2019-02-22 (×4): qty 0.4

## 2019-02-22 MED ORDER — LORAZEPAM 1 MG PO TABS
1.0000 mg | ORAL_TABLET | Freq: Every day | ORAL | Status: DC
  Administered 2019-02-23 – 2019-02-25 (×4): 1 mg via ORAL
  Filled 2019-02-22 (×4): qty 1

## 2019-02-22 MED ORDER — INSULIN ASPART 100 UNIT/ML ~~LOC~~ SOLN
0.0000 [IU] | Freq: Every day | SUBCUTANEOUS | Status: DC
  Administered 2019-02-23: 5 [IU] via SUBCUTANEOUS
  Administered 2019-02-24: 3 [IU] via SUBCUTANEOUS

## 2019-02-22 MED ORDER — SIMVASTATIN 20 MG PO TABS
20.0000 mg | ORAL_TABLET | Freq: Every day | ORAL | Status: DC
  Administered 2019-02-23 – 2019-02-25 (×3): 20 mg via ORAL
  Filled 2019-02-22 (×3): qty 1

## 2019-02-22 MED ORDER — CLOPIDOGREL BISULFATE 75 MG PO TABS: 75.0000 mg | ORAL_TABLET | Freq: Every day | ORAL | 0 refills | Status: DC

## 2019-02-22 MED ORDER — PANTOPRAZOLE SODIUM 40 MG PO TBEC
40.0000 mg | DELAYED_RELEASE_TABLET | Freq: Every day | ORAL | Status: DC
  Administered 2019-02-23 – 2019-02-26 (×4): 40 mg via ORAL
  Filled 2019-02-22 (×4): qty 1

## 2019-02-22 MED ORDER — CITALOPRAM HYDROBROMIDE 20 MG PO TABS
20.0000 mg | ORAL_TABLET | Freq: Every day | ORAL | Status: DC
  Administered 2019-02-23 – 2019-02-26 (×4): 20 mg via ORAL
  Filled 2019-02-22 (×4): qty 1

## 2019-02-22 MED ORDER — ACETAMINOPHEN 325 MG PO TABS: 650.0000 mg | ORAL_TABLET | Freq: Four times a day (QID) | ORAL | Status: DC | PRN

## 2019-02-22 MED ORDER — FLUCONAZOLE 200 MG PO TABS: 200.0000 mg | ORAL_TABLET | Freq: Every day | ORAL | 0 refills | Status: DC

## 2019-02-22 NOTE — Discharge Summary (Addendum)
Physician Discharge Summary  [name] [name] ZOX:[id number] DOB: 1936/06/29 DOA: 02/12/2019  PCP: Oval Linsey, MD Neurologist: Dr. Gerilyn Pilgrim  Admit date: 02/12/2019 Discharge date: 02/22/2019  Admitted From: Home  Disposition: Home with HHPT (insurance denied SNF placement)  Recommendations for Outpatient Follow-up:  1. Follow up with PCP in 1 weeks 2. Follow up with neurologist in 2-4 weeks  Home Health: PT  Discharge Condition: STABLE   CODE STATUS: FULL    Brief Hospitalization Summary: Please see all hospital notes, images, labs for full details of the hospitalization. HPI: [name] [name] is a 83 y.o. female with a complex past medical history especially detailed below but significant for COPD, hypertension, diabetes mellitus hypothyroidism coronary artery disease and multiple hospitalizations and ED visits over recent years presents to the emergency department after she had been seen several days ago reporting that she fell at home this morning.  She says that she has been generally weak and has difficulty standing.  She feels that her UTI is getting worse.  She was seen several days ago and diagnosed with a UTI and treated with ceftriaxone and discharged home on cephalexin.  She reports that she has been taking the medications as prescribed.  The urine culture did not have any significant bacterial growth.  She denies having chest pain and shortness of breath.  She denies having any significant trauma from the fall.  She apparently laid herself down when she started feeling weak.  She did not hit her head.  She was not down on the ground for prolonged period of time.  She does have poorly controlled diabetes mellitus.  She says that her blood glucose was tested by EMS and she was not hypoglycemic at the time of falling out.  She does have a history of having hypoglycemia from insulin.  She takes 70/30 insulin twice daily.  She also has a history of bradycardia.  She has had multiple  syncopal episodes in the past that have been worked up during previous admissions.  She denies abdominal pain and headaches.  She reports that she has been having some dizziness as well.  Her main complaint is weakness in the lower extremities.  No vision changes.  She denies having weakness in the extremities.  The patient on had a an MRI of the brain done several days ago when she was seen in the ED that did not show any findings of acute CVA.  She had chronic microvascular changes reported from that study.  ED course: The patient had a CT of the brain with no acute findings.  Her urinalysis was positive for yeast and multiple white blood cells noted.  She initially had a blood glucose of 65 that was treated and now improved to 120.  The patient was given a bolus of IV fluids.  Her vital signs temperature 97.8 pulse 65 blood pressure 112/61 and pulse ox 93% on room air.  She had a mild leukocytosis with a white blood cell count of 14.0.  Hemoglobin 12.6.  Platelet count 326.  Her creatinine was 1.15.  Her sodium was 142.  Her chest x-ray was without acute findings.  Brief/Interim Summary: 83 year old female with complex past medical history, reported to the hospital with generalized weakness, frequent falls.  She was noted to be dehydrated and was admitted for further treatment.  Discharge Diagnoses:  Active Problems:   Hypothyroidism   DM type 2 causing complication (HCC)   Hyperlipidemia   Obesity   Essential hypertension  Arteriosclerotic cardiovascular disease (ASCVD)   Sleep apnea   Chronic obstructive pulmonary disease (HCC)   Noncompliance   Noncompliance with CPAP treatment   Gait instability   Anemia of chronic disease   Hyperglycemia   Generalized weakness   Fall at home   Symptomatic Yeast UTI   Leukocytosis   AKI (acute kidney injury) (HCC)  1. Left lower extremity weakness.  MRI imaging of brain did not reveal any underlying CVA. 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 She received a total of 3 days of high dose steroids. per neurology recommendations, She is also being treated with dual antiplatelet therapy for severe intracranial stenosis.She does report some improvement of her symptoms.  She will need to follow-up with neurology in the next 2- 4 weeks.  She needs daily physical therapy since she is high risk for fall due to her significant left lower extremity weakness/foot drop.  SNF was recommended but her insurance has denied SNF coverage and patient will discharge home with [facility] PT.   2. Acute generalized weakness- this was present on admission and initially improved with IV fluids. 3. Type 2 diabetes mellitus, insulin requiring with neurological complications-monitor blood glucose closely as she has had some hypoglycemia. Monitored blood glucose 5 times per day. Sliding scale coverage ordered.  4. Yeast UTI - completing 14 day course of fluconazole.  5. AKI-RESOLVEDlikely prerenal and treated with IV hydration. 6. Fall at home-fall precautions recommended and PT recommending SNF. Insurance denied SNF placement.  Pt to go home with HHPT.  Patient had another fall in the hospital while she was being helped to the bathroom with 2 person assist.  She did not have any head injury or any other significant injuries. 7. Hypothyroidism- TSH stable.  8. Anemia of chronic disease-stable. 9. Chronic gait instability-fall precautions and PT recommending SNF. 10. OSA "offered nightly CPAP while in hospital. 11. Essential hypertension-stable BP.  12. Chronic pain and opioid dependence - resumed home meds.Patient had a negative urine drug screen on 02/08/2019.   Discharge Diagnoses:  Active Problems:   Hypothyroidism   DM type 2 causing complication (HCC)   Hyperlipidemia    Obesity   Essential hypertension   Arteriosclerotic cardiovascular disease (ASCVD)   Sleep apnea   Chronic obstructive pulmonary disease (HCC)   Noncompliance   Noncompliance with CPAP treatment   Gait instability   Anemia of chronic disease   Hyperglycemia   Generalized weakness   Fall at home   Symptomatic Yeast UTI   Leukocytosis   AKI (acute kidney injury) [facility])   Discharge Instructions: Discharge Instructions    Call MD for:  difficulty breathing, headache or visual disturbances   Complete by:  As directed    Call MD for:  extreme fatigue   Complete by:  As directed    Call MD for:  persistant dizziness or light-headedness   Complete by:  As directed    Call MD for:  persistant nausea and vomiting   Complete by:  As directed    Call MD for:  severe uncontrolled pain   Complete by:  As directed      Allergies as of 02/22/2019      Reactions   Codeine Itching      Medication List    STOP taking these medications   cephALEXin 500 MG capsule Commonly known as:  KEFLEX     TAKE these medications   amLODipine 5 MG tablet Commonly known as:  NORVASC Take 5 mg by mouth daily.   aspirin EC 81 MG tablet Take 1 tablet (81 mg total) by mouth daily.   benzonatate 200 MG capsule Commonly known as:  TESSALON Take 1 capsule (200 mg total) by mouth 3 (three) times daily as needed for cough.   citalopram 20 MG tablet Commonly known as:  CELEXA Take 1 tablet (20 mg total) by mouth daily.   clopidogrel 75 MG tablet Commonly known as:  PLAVIX Take 1 tablet (75 mg total) by mouth daily for 30 days.   fluconazole 200 MG tablet Commonly known as:  DIFLUCAN Take 1 tablet (200 mg total) by mouth daily for 3 days. Start taking on:  February 23, 2019   gabapentin 300 MG capsule Commonly known as:  NEURONTIN Take 300 mg by mouth 2 (two) times daily.   insulin NPH-regular Human (70-30) 100 UNIT/ML injection Inject 45 Units into the skin 2 (two) times daily.    ipratropium-albuterol 0.5-2.5 (3) MG/3ML Soln Commonly known as:  DUONEB Inhale 3 mLs into the lungs 4 (four) times daily as needed (for shortness of breath).   levothyroxine 25 MCG tablet Commonly known as:  SYNTHROID, LEVOTHROID Take 25 mcg by mouth daily.   lisinopril 10 MG tablet Commonly known as:  PRINIVIL,ZESTRIL Take 10 mg by mouth daily.   LORazepam 1 MG tablet Commonly known as:  ATIVAN Take 1 tablet (1 mg total) by mouth at bedtime.   metFORMIN 500 MG tablet Commonly known as:  GLUCOPHAGE Take 500 mg by mouth 2 (two) times daily with a meal.   nitroGLYCERIN 0.4 MG SL tablet Commonly known as:  NITROSTAT Place 0.4 mg under the tongue every 5 (five) minutes as needed for chest pain.   oxyCODONE 5 MG immediate release tablet Commonly known as:  Oxy IR/ROXICODONE Take 1 tablet (5 mg total) by mouth every 4 (four) hours as needed (Moderate pain).   pantoprazole 40 MG tablet Commonly known as:  PROTONIX Take 1 tablet (40 mg total) by mouth daily at 6 (six) AM.   simvastatin 20 MG tablet Commonly known as:  ZOCOR Take 20 mg by mouth daily.      Follow-up Information    Oval Linseyondiego, Richard, MD. Schedule an appointment as soon as possible for a visit in 1 week(s).   Specialty:  Internal Medicine Contact information: [address] [place] [address] [phone]        Beryle Beamsoonquah, Kofi, MD. Schedule an appointment as soon as possible for a visit in 2 week(s).   Specialty:  Neurology Why:  Hospital Follow Up  Contact information: [address] [place] [address] [phone]          Allergies  Allergen Reactions  . Codeine Itching   Allergies as of 02/22/2019      Reactions   Codeine Itching      Medication List    STOP taking these medications   cephALEXin 500 MG capsule Commonly known as:  KEFLEX     TAKE these medications   amLODipine 5 MG tablet Commonly known as:  NORVASC Take 5 mg by mouth daily.   aspirin EC 81 MG  tablet Take 1 tablet (81 mg total) by mouth daily.   benzonatate 200 MG capsule Commonly known as:  TESSALON Take 1 capsule (200 mg total) by mouth 3 (three) times daily as needed for cough.  citalopram 20 MG tablet Commonly known as:  CELEXA Take 1 tablet (20 mg total) by mouth daily.   clopidogrel 75 MG tablet Commonly known as:  PLAVIX Take 1 tablet (75 mg total) by mouth daily for 30 days.   fluconazole 200 MG tablet Commonly known as:  DIFLUCAN Take 1 tablet (200 mg total) by mouth daily for 3 days. Start taking on:  February 23, 2019   gabapentin 300 MG capsule Commonly known as:  NEURONTIN Take 300 mg by mouth 2 (two) times daily.   insulin NPH-regular Human (70-30) 100 UNIT/ML injection Inject 45 Units into the skin 2 (two) times daily.   ipratropium-albuterol 0.5-2.5 (3) MG/3ML Soln Commonly known as:  DUONEB Inhale 3 mLs into the lungs 4 (four) times daily as needed (for shortness of breath).   levothyroxine 25 MCG tablet Commonly known as:  SYNTHROID, LEVOTHROID Take 25 mcg by mouth daily.   lisinopril 10 MG tablet Commonly known as:  PRINIVIL,ZESTRIL Take 10 mg by mouth daily.   LORazepam 1 MG tablet Commonly known as:  ATIVAN Take 1 tablet (1 mg total) by mouth at bedtime.   metFORMIN 500 MG tablet Commonly known as:  GLUCOPHAGE Take 500 mg by mouth 2 (two) times daily with a meal.   nitroGLYCERIN 0.4 MG SL tablet Commonly known as:  NITROSTAT Place 0.4 mg under the tongue every 5 (five) minutes as needed for chest pain.   oxyCODONE 5 MG immediate release tablet Commonly known as:  Oxy IR/ROXICODONE Take 1 tablet (5 mg total) by mouth every 4 (four) hours as needed (Moderate pain).   pantoprazole 40 MG tablet Commonly known as:  PROTONIX Take 1 tablet (40 mg total) by mouth daily at 6 (six) AM.   simvastatin 20 MG tablet Commonly known as:  ZOCOR Take 20 mg by mouth daily.       Procedures/Studies: Dg Chest 2 View  Result Date:  02/08/2019 CLINICAL DATA:  Headache and dizziness. EXAM: CHEST - 2 VIEW COMPARISON:  01/13/2019 FINDINGS: The cardiac silhouette, mediastinal and hilar contours are within normal limits and stable. There are mild chronic bronchitic type lung changes with streaky areas of scarring change. No acute infiltrates, edema or effusions. The bony thorax is intact. IMPRESSION: Chronic lung changes but no acute pulmonary findings. Electronically Signed   By: Rudie Meyer M.D.   On: 02/08/2019 19:53   Ct Head Wo Contrast  Result Date: 02/12/2019 CLINICAL DATA:  Ataxia with stroke suspected EXAM: CT HEAD WITHOUT CONTRAST TECHNIQUE: Contiguous axial images were obtained from the base of the skull through the vertex without intravenous contrast. COMPARISON:  02/08/2019 FINDINGS: Brain: No evidence of acute infarction, hemorrhage, hydrocephalus, extra-axial collection or mass lesion/mass effect. Mild for age cerebral volume loss. Chronic small vessel ischemia in the white matter. Vascular: No hyperdense vessel or unexpected calcification. Skull: Normal. Negative for fracture or focal lesion. Sinuses/Orbits: No acute finding. IMPRESSION: Senescent changes without acute finding. Electronically Signed   By: Marnee Spring M.D.   On: 02/12/2019 09:02   Ct Head Wo Contrast  Result Date: 02/08/2019 CLINICAL DATA:  83 year old female with a history of dizziness EXAM: CT HEAD WITHOUT CONTRAST TECHNIQUE: Contiguous axial images were obtained from the base of the skull through the vertex without intravenous contrast. COMPARISON:  MR 02/08/2019, CT 01/03/2019 FINDINGS: Brain: No acute intracranial hemorrhage. No midline shift or mass effect. Gray-white differentiation maintained. Unremarkable configuration of the ventricles. Confluent hypodensity in the bilateral periventricular white matter. Vascular: Intracranial atherosclerosis of anterior  circulation Skull: No acute displaced fracture.  No aggressive bony lesions.  Sinuses/Orbits: No acute finding. Other: None IMPRESSION: Negative for acute intracranial abnormality. Evidence of chronic microvascular ischemic disease Electronically Signed   By: Gilmer Mor D.O.   On: 02/08/2019 19:48   [name] Contrast  Result Date: 02/17/2019 CLINICAL DATA:  Left-sided weakness and fall. EXAM: MRI HEAD WITHOUT CONTRAST MRA HEAD WITHOUT CONTRAST TECHNIQUE: Multiplanar, multiecho pulse sequences of the brain and surrounding structures were obtained without intravenous contrast. Angiographic images of the head were obtained using MRA technique without contrast. COMPARISON:  MRA brain 11/02/2018 Head CT 02/17/2019 Brain MRI 02/08/2019 FINDINGS: MRI HEAD FINDINGS BRAIN: There is no acute infarct, acute hemorrhage or mass effect. The midline structures are normal. Old right thalamic lacunar infarct. Early confluent hyperintense T2-weighted signal of the periventricular and deep white matter, most commonly due to chronic ischemic microangiopathy. Generalized atrophy without lobar predilection. Susceptibility-sensitive sequences show no chronic microhemorrhage or superficial siderosis. SKULL AND UPPER CERVICAL SPINE: The visualized skull base, calvarium, upper cervical spine and extracranial soft tissues are normal. SINUSES/ORBITS: No fluid levels or advanced mucosal thickening. No mastoid or middle ear effusion. The orbits are normal. MRA HEAD FINDINGS POSTERIOR CIRCULATION: --Basilar artery: Normal. --Posterior cerebral arteries: Multifocal severe stenosis of both P2 segments. The right PCA is predominantly supplied by the posterior communicating artery. --Superior cerebellar arteries: Normal. --Inferior cerebellar arteries: Normal anterior and posterior inferior cerebellar arteries. ANTERIOR CIRCULATION: --Intracranial internal carotid arteries: Normal. --Anterior cerebral arteries: Normal. Hypoplastic right A1 segment, normal variant. --Middle cerebral arteries: There are multiple  moderate-to-severe stenoses of the proximal right M2 branches. There is severe stenosis of the distal left M1 and proximal M2 branches. The left M1 stenosis is new compared to 11/02/18. --Posterior communicating arteries: Right p-comm gives rise to the right PCA. No left p-comm is visualized. IMPRESSION: 1. No acute ischemic infarct. 2. Chronic ischemic microangiopathy and generalized atrophy. 3. New, severe stenosis of the distal left M1 MCA segment. Unchanged bilateral MCA M2 range moderate-to-severe stenoses. 4. Unchanged multifocal severe stenoses of both posterior cerebral artery P2 segments. Electronically Signed   By: Deatra Robinson M.D.   On: 02/17/2019 15:38   [name] Contrast (neuro Protocol)  Result Date: 02/08/2019 CLINICAL DATA:  Ataxia, vertigo EXAM: MRI HEAD WITHOUT CONTRAST TECHNIQUE: Multiplanar, multiecho pulse sequences of the brain and surrounding structures were obtained without intravenous contrast. COMPARISON:  Brain MRI 11/02/2018 FINDINGS: BRAIN: There is no acute infarct, acute hemorrhage, hydrocephalus or extra-axial collection. The midline structures are normal. No midline shift or other mass effect. Diffuse confluent hyperintense T2-weighted signal within the periventricular, deep and juxtacortical white matter, most commonly due to chronic ischemic microangiopathy. Generalized atrophy without lobar predilection. Susceptibility-sensitive sequences show no chronic microhemorrhage or superficial siderosis. Small, old left cerebellar infarct and bilateral thalamic infarcts. VASCULAR: Major intracranial arterial and venous sinus flow voids are normal. SKULL AND UPPER CERVICAL SPINE: Calvarial bone marrow signal is normal. There is no skull base mass. Visualized upper cervical spine and soft tissues are normal. SINUSES/ORBITS: No fluid levels or advanced mucosal thickening. No mastoid or middle ear effusion. The orbits are normal. IMPRESSION: Chronic ischemic microangiopathy and  generalized atrophy without acute intracranial abnormality. Electronically Signed   By: Deatra Robinson M.D.   On: 02/08/2019 18:36   Mr Cervical Spine Wo Contrast  Result Date: 02/18/2019 CLINICAL DATA:  Back pain and left lower extremity weakness over the last week. EXAM: MRI CERVICAL, THORACIC AND LUMBAR SPINE WITHOUT AND  WITH CONTRAST TECHNIQUE: Multiplanar and multiecho pulse sequences of the cervical spine, to include the craniocervical junction and cervicothoracic junction, and thoracic and lumbar spine, were obtained without and with intravenous contrast. COMPARISON:  CT 02/12/2019.  Lumbar MRI 12/21/2018 CONTRAST:  10 cc Gadavist FINDINGS: MRI CERVICAL SPINE FINDINGS Alignment: Normal Vertebrae: No fracture or primary bone lesion. Cord: No cord compression or primary cord lesion. Posterior Fossa, vertebral arteries, paraspinal tissues: Negative Disc levels: No abnormality at C1-2, C2-3 or C3-4. C4-5: Right paracentral disc herniation with slight upward migration indents the ventral subarachnoid space but does not compress the cord or show foraminal extension. C5-6: Endplate osteophytes and bulging of the disc more prominent towards the right. No compressive canal stenosis. Right foraminal narrowing of a moderate degree. C6-7: Bulging of the disc and bilateral uncovertebral prominence. No compressive central canal stenosis. Mild bilateral foraminal narrowing. C7-T1: Bulging of the disc.  No canal or foraminal stenosis. MRI THORACIC SPINE FINDINGS Alignment:  No significant malalignment. Vertebrae: No fracture or primary bone lesion. Cord:  No primary cord lesion.  See below regarding stenosis. Paraspinal and other soft tissues: Negative Disc levels: T2-3: Mild noncompressive disc bulge. T4-5: Mild noncompressive disc bulge. T5-6: Shallow left posterolateral herniation without visible neural compression T6-7: Shallow central disc herniation without visible neural compression. T7-8: Central to left-sided  disc herniation, contacting the left side of the cord. This could possibly be symptomatic. T8-9: Mild noncompressive disc bulge. T9-10: Mild noncompressive disc bulge. T10-11 through T12-L1: Mild noncompressive disc bulges. MRI LUMBAR SPINE FINDINGS Segmentation:  5 lumbar type vertebral bodies. Alignment: Straightening of the normal lumbar lordosis. Mild curvature convex to the left. Vertebrae: No fracture or primary bone lesion. Discogenic endplate marrow changes on the right at L3-4 and L4-5. Conus medullaris and cauda equina: Conus extends to the L1 level. Conus and cauda equina appear normal. Paraspinal and other soft tissues: Negative Disc levels: L1-2: Previous left hemilaminectomy. Broad-based disc herniation more prominent in the left posterolateral direction. This indents the thecal sac but does not cause visible neural compression. L2-3: Previous left hemilaminectomy. Endplate osteophytes and bulging of the disc. No apparent compressive stenosis. Foraminal to extraforaminal encroachment on the right by osteophyte in disc does not appear to compress the L2 nerve, but could irritate that structure. L3-4: Endplate osteophytes and bulging of the disc. Mild facet and ligamentous hypertrophy. Mild stenosis of the lateral recesses and foramina but without definite neural compression. L4-5: Endplate osteophytes and broad-based protrusion of the disc. Mild facet and ligamentous hypertrophy. Stenosis of the lateral recesses and neural foramina that could cause neural compression on either or both sides. L5-S1: Mild bulging of the disc.  No canal or foraminal stenosis. IMPRESSION: Cervical region: No cord compression or primary cord lesion. Right paracentral disc herniation at C4-5 with slight upward migration, not apparently resulting in neural compression. Spondylosis at C5-6 and C6-7 with moderate right foraminal narrowing at C5-6 and mild bilateral foraminal narrowing at C6-7. Thoracic region: No fracture. No  primary cord lesion. Degenerative spondylosis at multiple levels without apparent neural compression. Left posterolateral disc herniation at T7-8 which does contact the left side of the cord and could be symptomatic. Ample subarachnoid spaces present dorsal to cord however. Lumbar region: No change appreciated since December. Previous left-sided decompression at L1-2 and L2-3. No ongoing left-sided neural compression visible at those levels. L3-4: Mild stenosis of the lateral recesses and foramina but without definite neural compression. L4-5: Bilateral lateral recess and foraminal stenosis because of encroachment by osteophyte  in disc material. Neural compression could occur on either side at this level. Electronically Signed   By: Paulina Fusi M.D.   On: 02/18/2019 09:44   Mr Thoracic Spine Wo Contrast  Result Date: 02/18/2019 CLINICAL DATA:  Back pain and left lower extremity weakness over the last week. EXAM: MRI CERVICAL, THORACIC AND LUMBAR SPINE WITHOUT AND WITH CONTRAST TECHNIQUE: Multiplanar and multiecho pulse sequences of the cervical spine, to include the craniocervical junction and cervicothoracic junction, and thoracic and lumbar spine, were obtained without and with intravenous contrast. COMPARISON:  CT 02/12/2019.  Lumbar MRI 12/21/2018 CONTRAST:  10 cc Gadavist FINDINGS: MRI CERVICAL SPINE FINDINGS Alignment: Normal Vertebrae: No fracture or primary bone lesion. Cord: No cord compression or primary cord lesion. Posterior Fossa, vertebral arteries, paraspinal tissues: Negative Disc levels: No abnormality at C1-2, C2-3 or C3-4. C4-5: Right paracentral disc herniation with slight upward migration indents the ventral subarachnoid space but does not compress the cord or show foraminal extension. C5-6: Endplate osteophytes and bulging of the disc more prominent towards the right. No compressive canal stenosis. Right foraminal narrowing of a moderate degree. C6-7: Bulging of the disc and bilateral  uncovertebral prominence. No compressive central canal stenosis. Mild bilateral foraminal narrowing. C7-T1: Bulging of the disc.  No canal or foraminal stenosis. MRI THORACIC SPINE FINDINGS Alignment:  No significant malalignment. Vertebrae: No fracture or primary bone lesion. Cord:  No primary cord lesion.  See below regarding stenosis. Paraspinal and other soft tissues: Negative Disc levels: T2-3: Mild noncompressive disc bulge. T4-5: Mild noncompressive disc bulge. T5-6: Shallow left posterolateral herniation without visible neural compression T6-7: Shallow central disc herniation without visible neural compression. T7-8: Central to left-sided disc herniation, contacting the left side of the cord. This could possibly be symptomatic. T8-9: Mild noncompressive disc bulge. T9-10: Mild noncompressive disc bulge. T10-11 through T12-L1: Mild noncompressive disc bulges. MRI LUMBAR SPINE FINDINGS Segmentation:  5 lumbar type vertebral bodies. Alignment: Straightening of the normal lumbar lordosis. Mild curvature convex to the left. Vertebrae: No fracture or primary bone lesion. Discogenic endplate marrow changes on the right at L3-4 and L4-5. Conus medullaris and cauda equina: Conus extends to the L1 level. Conus and cauda equina appear normal. Paraspinal and other soft tissues: Negative Disc levels: L1-2: Previous left hemilaminectomy. Broad-based disc herniation more prominent in the left posterolateral direction. This indents the thecal sac but does not cause visible neural compression. L2-3: Previous left hemilaminectomy. Endplate osteophytes and bulging of the disc. No apparent compressive stenosis. Foraminal to extraforaminal encroachment on the right by osteophyte in disc does not appear to compress the L2 nerve, but could irritate that structure. L3-4: Endplate osteophytes and bulging of the disc. Mild facet and ligamentous hypertrophy. Mild stenosis of the lateral recesses and foramina but without definite  neural compression. L4-5: Endplate osteophytes and broad-based protrusion of the disc. Mild facet and ligamentous hypertrophy. Stenosis of the lateral recesses and neural foramina that could cause neural compression on either or both sides. L5-S1: Mild bulging of the disc.  No canal or foraminal stenosis. IMPRESSION: Cervical region: No cord compression or primary cord lesion. Right paracentral disc herniation at C4-5 with slight upward migration, not apparently resulting in neural compression. Spondylosis at C5-6 and C6-7 with moderate right foraminal narrowing at C5-6 and mild bilateral foraminal narrowing at C6-7. Thoracic region: No fracture. No primary cord lesion. Degenerative spondylosis at multiple levels without apparent neural compression. Left posterolateral disc herniation at T7-8 which does contact the left side of the cord  and could be symptomatic. Ample subarachnoid spaces present dorsal to cord however. Lumbar region: No change appreciated since December. Previous left-sided decompression at L1-2 and L2-3. No ongoing left-sided neural compression visible at those levels. L3-4: Mild stenosis of the lateral recesses and foramina but without definite neural compression. L4-5: Bilateral lateral recess and foraminal stenosis because of encroachment by osteophyte in disc material. Neural compression could occur on either side at this level. Electronically Signed   By: Paulina Fusi M.D.   On: 02/18/2019 09:44   Mr Lumbar Spine W Wo Contrast  Result Date: 02/18/2019 CLINICAL DATA:  Back pain and left lower extremity weakness over the last week. EXAM: MRI CERVICAL, THORACIC AND LUMBAR SPINE WITHOUT AND WITH CONTRAST TECHNIQUE: Multiplanar and multiecho pulse sequences of the cervical spine, to include the craniocervical junction and cervicothoracic junction, and thoracic and lumbar spine, were obtained without and with intravenous contrast. COMPARISON:  CT 02/12/2019.  Lumbar MRI 12/21/2018 CONTRAST:  10  cc [facility] FINDINGS: MRI CERVICAL SPINE FINDINGS Alignment: Normal Vertebrae: No fracture or primary bone lesion. Cord: No cord compression or primary cord lesion. Posterior Fossa, vertebral arteries, paraspinal tissues: Negative Disc levels: No abnormality at C1-2, C2-3 or C3-4. C4-5: Right paracentral disc herniation with slight upward migration indents the ventral subarachnoid space but does not compress the cord or show foraminal extension. C5-6: Endplate osteophytes and bulging of the disc more prominent towards the right. No compressive canal stenosis. Right foraminal narrowing of a moderate degree. C6-7: Bulging of the disc and bilateral uncovertebral prominence. No compressive central canal stenosis. Mild bilateral foraminal narrowing. C7-T1: Bulging of the disc.  No canal or foraminal stenosis. MRI THORACIC SPINE FINDINGS Alignment:  No significant malalignment. Vertebrae: No fracture or primary bone lesion. Cord:  No primary cord lesion.  See below regarding stenosis. Paraspinal and other soft tissues: Negative Disc levels: T2-3: Mild noncompressive disc bulge. T4-5: Mild noncompressive disc bulge. T5-6: Shallow left posterolateral herniation without visible neural compression T6-7: Shallow central disc herniation without visible neural compression. T7-8: Central to left-sided disc herniation, contacting the left side of the cord. This could possibly be symptomatic. T8-9: Mild noncompressive disc bulge. T9-10: Mild noncompressive disc bulge. T10-11 through T12-L1: Mild noncompressive disc bulges. MRI LUMBAR SPINE FINDINGS Segmentation:  5 lumbar type vertebral bodies. Alignment: Straightening of the normal lumbar lordosis. Mild curvature convex to the left. Vertebrae: No fracture or primary bone lesion. Discogenic endplate marrow changes on the right at L3-4 and L4-5. Conus medullaris and cauda equina: Conus extends to the L1 level. Conus and cauda equina appear normal. Paraspinal and other soft tissues:  Negative Disc levels: L1-2: Previous left hemilaminectomy. Broad-based disc herniation more prominent in the left posterolateral direction. This indents the thecal sac but does not cause visible neural compression. L2-3: Previous left hemilaminectomy. Endplate osteophytes and bulging of the disc. No apparent compressive stenosis. Foraminal to extraforaminal encroachment on the right by osteophyte in disc does not appear to compress the L2 nerve, but could irritate that structure. L3-4: Endplate osteophytes and bulging of the disc. Mild facet and ligamentous hypertrophy. Mild stenosis of the lateral recesses and foramina but without definite neural compression. L4-5: Endplate osteophytes and broad-based protrusion of the disc. Mild facet and ligamentous hypertrophy. Stenosis of the lateral recesses and neural foramina that could cause neural compression on either or both sides. L5-S1: Mild bulging of the disc.  No canal or foraminal stenosis. IMPRESSION: Cervical region: No cord compression or primary cord lesion. Right paracentral disc herniation at  C4-5 with slight upward migration, not apparently resulting in neural compression. Spondylosis at C5-6 and C6-7 with moderate right foraminal narrowing at C5-6 and mild bilateral foraminal narrowing at C6-7. Thoracic region: No fracture. No primary cord lesion. Degenerative spondylosis at multiple levels without apparent neural compression. Left posterolateral disc herniation at T7-8 which does contact the left side of the cord and could be symptomatic. Ample subarachnoid spaces present dorsal to cord however. Lumbar region: No change appreciated since December. Previous left-sided decompression at L1-2 and L2-3. No ongoing left-sided neural compression visible at those levels. L3-4: Mild stenosis of the lateral recesses and foramina but without definite neural compression. L4-5: Bilateral lateral recess and foraminal stenosis because of encroachment by osteophyte in  disc material. Neural compression could occur on either side at this level. Electronically Signed   By: Paulina Fusi M.D.   On: 02/18/2019 09:44   US Carotid Bilateral (at [facility] Only)  Result Date: 02/17/2019 CLINICAL DATA:  Stroke for 12 hours EXAM: BILATERAL CAROTID DUPLEX ULTRASOUND TECHNIQUE: Wallace Cullens scale imaging, color Doppler and duplex ultrasound were performed of bilateral carotid and vertebral arteries in the neck. COMPARISON:  08/02/2016 FINDINGS: Criteria: Quantification of carotid stenosis is based on velocity parameters that correlate the residual internal carotid diameter with NASCET-based stenosis levels, using the diameter of the distal internal carotid lumen as the denominator for stenosis measurement. The following velocity measurements were obtained: RIGHT ICA: 101 cm/sec CCA: 138 cm/sec SYSTOLIC ICA/CCA RATIO:  0.7 ECA: 120 cm/sec LEFT ICA: 72 cm/sec CCA: 67 cm/sec SYSTOLIC ICA/CCA RATIO:  1.1 ECA: 141 cm/sec RIGHT CAROTID ARTERY: Mild irregular calcified plaque in the bulb. Low resistance internal carotid Doppler pattern is preserved. RIGHT VERTEBRAL ARTERY:  Antegrade. LEFT CAROTID ARTERY: Moderate predominately smooth calcified plaque in the bulb. Low resistance internal carotid Doppler pattern is preserved. LEFT VERTEBRAL ARTERY:  Antegrade. IMPRESSION: Less than 50% stenosis in the right and left internal carotid arteries. Antegrade vertebral arteries bilaterally. Electronically Signed   By: Jolaine Click M.D.   On: 02/17/2019 16:01   Dg Chest Portable 1 View  Result Date: 02/12/2019 CLINICAL DATA:  Status post fall this morning getting out of bed. EXAM: PORTABLE CHEST 1 VIEW COMPARISON:  PA and lateral chest 02/08/2019 and 02/28/2018. FINDINGS: The patient is status post CABG. Heart size is upper normal. Mild discoid atelectasis left lung base noted. Lungs otherwise clear. No pneumothorax or pleural effusion. No acute bony abnormality. There is partial visualization of fixation  of a right humerus fracture and rotator cuff repair on the right. IMPRESSION: No acute disease. Electronically Signed   By: Drusilla Kanner M.D.   On: 02/12/2019 08:53   [name] Wo Contrast  Result Date: 02/17/2019 CLINICAL DATA:  Left-sided weakness and fall. EXAM: MRI HEAD WITHOUT CONTRAST MRA HEAD WITHOUT CONTRAST TECHNIQUE: Multiplanar, multiecho pulse sequences of the brain and surrounding structures were obtained without intravenous contrast. Angiographic images of the head were obtained using MRA technique without contrast. COMPARISON:  MRA brain 11/02/2018 Head CT 02/17/2019 Brain MRI 02/08/2019 FINDINGS: MRI HEAD FINDINGS BRAIN: There is no acute infarct, acute hemorrhage or mass effect. The midline structures are normal. Old right thalamic lacunar infarct. Early confluent hyperintense T2-weighted signal of the periventricular and deep white matter, most commonly due to chronic ischemic microangiopathy. Generalized atrophy without lobar predilection. Susceptibility-sensitive sequences show no chronic microhemorrhage or superficial siderosis. SKULL AND UPPER CERVICAL SPINE: The visualized skull base, calvarium, upper cervical spine and extracranial soft tissues are normal. SINUSES/ORBITS:  No fluid levels or advanced mucosal thickening. No mastoid or middle ear effusion. The orbits are normal. MRA HEAD FINDINGS POSTERIOR CIRCULATION: --Basilar artery: Normal. --Posterior cerebral arteries: Multifocal severe stenosis of both P2 segments. The right PCA is predominantly supplied by the posterior communicating artery. --Superior cerebellar arteries: Normal. --Inferior cerebellar arteries: Normal anterior and posterior inferior cerebellar arteries. ANTERIOR CIRCULATION: --Intracranial internal carotid arteries: Normal. --Anterior cerebral arteries: Normal. Hypoplastic right A1 segment, normal variant. --Middle cerebral arteries: There are multiple moderate-to-severe stenoses of the proximal right M2  branches. There is severe stenosis of the distal left M1 and proximal M2 branches. The left M1 stenosis is new compared to 11/02/18. --Posterior communicating arteries: Right p-comm gives rise to the right PCA. No left p-comm is visualized. IMPRESSION: 1. No acute ischemic infarct. 2. Chronic ischemic microangiopathy and generalized atrophy. 3. New, severe stenosis of the distal left M1 MCA segment. Unchanged bilateral MCA M2 range moderate-to-severe stenoses. 4. Unchanged multifocal severe stenoses of both posterior cerebral artery P2 segments. Electronically Signed   By: Deatra Robinson M.D.   On: 02/17/2019 15:38   Ct Head Code Stroke Wo Contrast  Result Date: 02/17/2019 CLINICAL DATA:  Code stroke. Fall today. New onset left lower extremity weakness. EXAM: CT HEAD WITHOUT CONTRAST TECHNIQUE: Contiguous axial images were obtained from the base of the skull through the vertex without intravenous contrast. COMPARISON:  CT head without contrast 02/12/2019. MRI brain 02/08/2019 FINDINGS: Brain: Moderate atrophy and white matter disease is stable. Basal ganglia are intact. Insular ribbon is normal bilaterally. No acute or focal cortical abnormalities are present. The ventricles are of proportionate to the degree of atrophy. No significant extraaxial fluid collection is present. The brainstem and cerebellum are within normal limits. Vascular: Atherosclerotic calcifications are again noted within the cavernous internal carotid arteries. There is no hyperdense vessel. Skull: Calvarium is intact. No focal lytic or blastic lesions are present. Sinuses/Orbits: Chronic right sphenoid sinus disease is present. The paranasal sinuses are otherwise clear. Bilateral lens replacements are noted. Globes and orbits are otherwise unremarkable. ASPECTS [facility] Stroke Program Early CT Score) - Ganglionic level infarction (caudate, lentiform nuclei, internal capsule, insula, M1-M3 cortex): 7/7 - Supraganglionic infarction (M4-M6  cortex): 3/3 Total score (0-10 with 10 being normal): 10/10 IMPRESSION: 1. No acute intracranial abnormality or significant interval change. 2. Stable moderate atrophy and white matter disease. 3. ASPECTS is 10/10 These results were called by telephone at the time of interpretation on 02/17/2019 at 1:28 pm to Dr. Durward [facility]ard [facility] , who verbally acknowledged these results. Electronically Signed   By: Marin Roberts M.D.   On: 02/17/2019 13:28     Subjective: Pt reports that she is wanting to go home as her insurance denied SNF placement.    Discharge Exam: Vitals:   02/21/19 2207 02/22/19 0627  BP: (!) 141/54 (!) 148/75  Pulse: 64 (!) 43  Resp:    Temp: 97.8 F (36.6 C) 98.1 F (36.7 C)  SpO2: 93% 100%   Vitals:   02/21/19 0320 02/21/19 1339 02/21/19 2207 02/22/19 0627  BP: (!) 158/71 (!) 121/55 (!) 141/54 (!) 148/75  Pulse: 70 63 64 (!) 43  Resp: 20 18    Temp: 98 F (36.7 C) 98.5 F (36.9 C) 97.8 F (36.6 C) 98.1 F (36.7 C)  TempSrc: Oral  Oral Oral  SpO2: 96% 96% 93% 100%  Weight:      Height:       General: Pt is alert, awake, not in acute distress Cardiovascular: normal  S1/S2 +, no rubs, no gallops Respiratory: CTA bilaterally, no wheezing, no rhonchi Abdominal: Soft, NT, ND, bowel sounds + Extremities: no edema, no cyanosis Neuro: Overall left leg weakness is better, but continues to have mild left foot drop   The results of significant diagnostics from this hospitalization (including imaging, microbiology, ancillary and laboratory) are listed below for reference.     Microbiology: Recent Results (from the past 240 hour(s))  Culture, blood (Routine X 2) w Reflex to ID Panel     Status: None   Collection Time: 02/12/19  3:02 PM  Result Value Ref Range Status   Specimen Description BLOOD LEFT ANTECUBITAL  Final   Special Requests   Final    BOTTLES DRAWN AEROBIC AND ANAEROBIC Blood Culture adequate volume   Culture   Final    NO GROWTH 5 DAYS Performed at  [facility], [address], [place], [place] [address]    Report Status 02/17/2019 FINAL  Final  Culture, blood (Routine X 2) w Reflex to ID Panel     Status: None   Collection Time: 02/12/19  3:03 PM  Result Value Ref Range Status   Specimen Description BLOOD BLOOD LEFT HAND  Final   Special Requests   Final    BOTTLES DRAWN AEROBIC AND ANAEROBIC Blood Culture adequate volume   Culture   Final    NO GROWTH 5 DAYS Performed at [facility], [address], [place], [place] [address]    Report Status 02/17/2019 FINAL  Final  Urine Culture     Status: None   Collection Time: 02/12/19 11:45 PM  Result Value Ref Range Status   Specimen Description   Final    URINE, CLEAN CATCH Performed at [facility], [address], [place], [place] [address]    Special Requests   Final    NONE Performed at [facility], [address], [place], [place] [address]    Culture   Final    NO GROWTH Performed at [facility], [address] [place], [place] [address]    Report Status 02/14/2019 FINAL  Final     Labs: BNP (last 3 results) Recent Labs    02/28/18 1708 09/13/18 0026 12/13/18 0419  BNP 72.0 63.0 475.0*   Basic Metabolic Panel: Recent Labs  Lab 02/17/19 1313 02/19/19 0550 02/19/19 1133 02/20/19 0649  NA 136  --   --  139  K 4.1  --   --  4.0  CL 103  --   --  108  CO2 26  --   --  23  GLUCOSE 213*  --  435* 279*  BUN 12  --   --  19  CREATININE 0.88 0.70  --  0.70  CALCIUM 9.1  --   --  9.0   Liver Function Tests: Recent Labs  Lab 02/17/19 1313  AST 16  ALT 18  ALKPHOS 64  BILITOT 0.4  PROT 6.4*  ALBUMIN 3.4*   No results for input(s): LIPASE, AMYLASE in the last 168 hours. No results for input(s): AMMONIA in the last 168 hours. CBC: Recent Labs  Lab 02/17/19 1313  WBC 9.9  HGB 11.7*  HCT 40.6  MCV 81.2  PLT 308   Cardiac Enzymes: No results for input(s): CKTOTAL, CKMB, CKMBINDEX, TROPONINI in the last 168 hours. BNP: Invalid input(s):  POCBNP CBG: Recent Labs  Lab 02/21/19 1113 02/21/19 1613 02/21/19 2056 02/22/19 0750 02/22/19 1142  GLUCAP 349* 306* 228* 172* 346*  D-Dimer No results for input(s): DDIMER in the last 72 hours. Hgb A1c No results for input(s): HGBA1C in the last 72 hours. Lipid Profile No results for input(s): CHOL, HDL, LDLCALC, TRIG, CHOLHDL, LDLDIRECT in the last 72 hours. Thyroid function studies No results for input(s): TSH, T4TOTAL, T3FREE, THYROIDAB in the last 72 hours.  Invalid input(s): FREET3 Anemia work up No results for input(s): VITAMINB12, FOLATE, FERRITIN, TIBC, IRON, RETICCTPCT in the last 72 hours. Urinalysis    Component Value Date/Time   COLORURINE YELLOW 02/12/2019 0823   APPEARANCEUR CLOUDY (A) 02/12/2019 0823   LABSPEC 1.024 02/12/2019 0823   PHURINE 5.0 02/12/2019 0823   GLUCOSEU 150 (A) 02/12/2019 0823   HGBUR NEGATIVE 02/12/2019 0823   BILIRUBINUR NEGATIVE 02/12/2019 0823   KETONESUR NEGATIVE 02/12/2019 0823   PROTEINUR 100 (A) 02/12/2019 0823   UROBILINOGEN 0.2 08/29/2015 1842   NITRITE NEGATIVE 02/12/2019 0823   LEUKOCYTESUR LARGE (A) 02/12/2019 0823   Sepsis Labs Invalid input(s): PROCALCITONIN,  WBC,  LACTICIDVEN Microbiology Recent Results (from the past 240 hour(s))  Culture, blood (Routine X 2) w Reflex to ID Panel     Status: None   Collection Time: 02/12/19  3:02 PM  Result Value Ref Range Status   Specimen Description BLOOD LEFT ANTECUBITAL  Final   Special Requests   Final    BOTTLES DRAWN AEROBIC AND ANAEROBIC Blood Culture adequate volume   Culture   Final    NO GROWTH 5 DAYS Performed at [facility], [address], [place], [place] [address]    Report Status 02/17/2019 FINAL  Final  Culture, blood (Routine X 2) w Reflex to ID Panel     Status: None   Collection Time: 02/12/19  3:03 PM  Result Value Ref Range Status   Specimen Description BLOOD BLOOD LEFT HAND  Final   Special Requests   Final    BOTTLES DRAWN AEROBIC AND ANAEROBIC  Blood Culture adequate volume   Culture   Final    NO GROWTH 5 DAYS Performed at [facility], [address], [place], [place] [address]    Report Status 02/17/2019 FINAL  Final  Urine Culture     Status: None   Collection Time: 02/12/19 11:45 PM  Result Value Ref Range Status   Specimen Description   Final    URINE, CLEAN CATCH Performed at [facility], [address] [place], [place] [address]    Special Requests   Final    NONE Performed at [facility], [address], [place], [place] [address]    Culture   Final    NO GROWTH Performed at [facility], [address] [place], [place] [address]    Report Status 02/14/2019 FINAL  Final   Time coordinating discharge: 35 mins  SIGNED:  Standley Dakins, MD  [facility] 02/22/2019, 1:56 PM How to contact the [facility] Attending or Consulting provider 7A - 7P or covering provider during after hours 7P -7A, for this patient?  1. Check the care team in [facility] and look for a) attending/consulting [facility] provider listed and b) the [facility] team listed 2. Log into [email or web address] and use [facility]'s universal password to access. If you do not have the password, please contact the hospital operator. 3. Locate the [facility] provider you are looking for under [facility] and page to a number that you can be directly reached. 4. If you still have difficulty reaching the provider, please page the [facility] (Director on Call) for the Hospitalists listed  on amion for assistance.

## 2019-02-22 NOTE — ED Notes (Signed)
Pt given peanut butter, crackers, and sprite at this time.

## 2019-02-22 NOTE — ED Triage Notes (Signed)
Pt brought in by [facility] for c/o fall and weakness; pt was just discharged from 300 for weakness; family told ems they wanted her to be evaluated because they could not care for her at home; cbg 25

## 2019-02-22 NOTE — ED Provider Notes (Signed)
[facility] EMERGENCY DEPARTMENT Provider Note   CSN: [id number] Arrival date & time: 02/22/19  2014    History   Chief Complaint Chief Complaint  Patient presents with  . Fall    HPI [name] [name] is a 83 y.o. female.     HPI   She presents for evaluation of weakness and difficulty ambulating.  Patient was discharged from the hospital today after a prolonged evaluation for falling.  Today when she got home she was walking in the yard with her great grandson and she collapsed to the ground.  She was assisted to the ground so did not fall.  Family members were concerned that she was having trouble moving her left side, more than her right.  They brought her back here for evaluation because they were uncomfortable staying at home with her tonight.  The patient was planning to stay with her daughter, tonight.  She is here in the ED, with her granddaughter, and great-grandson.  The great-grandson picked her up from the hospital today when she was discharged.  He was apparently helping the patient walk today when she collapsed, and using a strap around her waist to help keep her standing.  Patient denies other problems.  She is a poor historian.  Family members do not note any additional concurrent problems.  Past Medical History:  Diagnosis Date  . Arthritis   . CAD (coronary artery disease)    Multivessel status post CABG 2001  . Chronic obstructive pulmonary disease (HCC)   . Chronic pain   . DDD (degenerative disc disease), lumbar   . Diastolic CHF (HCC)   . Essential hypertension   . Hematuria   . History of CVA (cerebrovascular accident)    2013 - lacunar infarct right thalamus - residual left side of  lip numb  and left eye vision worse (which corrented lens implant from cataract extraction)  . History of MI (myocardial infarction)    July 2001  . Hyperlipidemia   . Hypothyroidism   . Insulin dependent type 2 diabetes mellitus (HCC)   . Lesion of bladder   . Lower  urinary tract symptoms (LUTS)   . OSA (obstructive sleep apnea)   . Peripheral neuropathy   . Psoriasis   . SUI (stress urinary incontinence, female)   . Varicose veins     Patient Active Problem List   Diagnosis Date Noted  . Generalized weakness 02/12/2019  . Fall at home 02/12/2019  . Symptomatic Yeast UTI 02/12/2019  . Leukocytosis 02/12/2019  . AKI (acute kidney injury) (HCC) 02/12/2019  . COPD with acute exacerbation (HCC) 12/13/2018  . CVA (cerebral vascular accident) (HCC) 11/01/2018  . Syncope   . Loss of consciousness (HCC) 06/10/2018  . Hyperglycemia 06/10/2018  . Bradycardia, sinus 06/10/2018  . Diastolic dysfunction 09/29/2017  . CAD (coronary artery disease) 09/28/2017  . Acute lower UTI 09/28/2017  . Ischemic stroke (HCC) 05/10/2017  . Acute ischemic stroke (HCC) 05/10/2017  . Stroke (HCC) 08/01/2016  . Dysarthria 03/18/2016  . Anemia of chronic disease 11/19/2015  . OSA (obstructive sleep apnea) 11/19/2015  . Left facial numbness 08/29/2015  . Gait instability 06/26/2015  . COPD exacerbation (HCC) 11/13/2014  . Weakness generalized 12/10/2013  . TIA (transient ischemic attack) 06/22/2012  . Left-sided headache 06/22/2012  . Noncompliance 06/22/2012  . Noncompliance with CPAP treatment 06/22/2012  . Psoriasis 01/07/2012  . Cerebrovascular disease   . Chronic back pain   . Chronic obstructive pulmonary disease (HCC)   .  DM type 2 causing complication (HCC) 02/14/2010  . Obesity 02/14/2010  . Arteriosclerotic cardiovascular disease (ASCVD) 02/14/2010  . Hypothyroidism 02/06/2010  . Hyperlipidemia 02/06/2010  . Essential hypertension 02/06/2010  . Sleep apnea 02/06/2010    Past Surgical History:  Procedure Laterality Date  . APPENDECTOMY  age 70  . CARDIAC CATHETERIZATION  1991   No sig. cad  . CARDIAC CATHETERIZATION  07-01-2000   high grade in-stent restenosis  . CARDIAC CATHETERIZATION  04-30-2001   single native vessel cad/ graft patent  .  CARDIAC CATHETERIZATION  02-13-2006  dr Jenness Corner cad with total occluded LAD  and  70% ostial of small ramus/  grafts patent/  normal ef  . CARDIAC CATHETERIZATION  01-20-2000  dr Clifton James   essentially unchanged;  severe single vessel cad with diffuse disease throughout CFX and RCA/ ef 66%;  chf with preserved LVSF  . CARDIOVASCULAR STRESS TEST  08-28-2011   DR ROTHBART   NEGATIVE NUCLEAR STUDY/  GLOBAL LVSF/  EF 65%  . CARPAL TUNNEL RELEASE Bilateral yrs ago  . CATARACT EXTRACTION W/ INTRAOCULAR LENS  IMPLANT, BILATERAL  2013  . CHOLECYSTECTOMY  1990  . COLONOSCOPY N/A 07/26/2015   Procedure: COLONOSCOPY;  Surgeon: Malissa Hippo, MD;  [facility];  Service: Endoscopy;  Laterality: N/A;  1200  . CORONARY ANGIOPLASTY  11-13-1999   balloon angioplasty to mLAD , d2 of LAD  . CORONARY ANGIOPLASTY WITH STENT PLACEMENT  01-17-2000   PCI stenting to mLAD & D2 of LAD  . CORONARY ARTERY BYPASS GRAFT  07-02-2000   DR Tressie Stalker   SVG to diagonal, LIMA to LAD  . CYSTOSCOPY W/ RETROGRADES Bilateral 08/09/2014   Procedure: CYSTOSCOPY, BILATERAL RETROGRADE, HYDRODISTENSION, BLADDER BIOPSY WITH FULGERATION, INSTILL PYRIDIUM AND MARCAINE ;  Surgeon: Anner Crete, MD;  Location: [facility];  Service: Urology;  Laterality: Bilateral;  . KNEE ARTHROSCOPY Left 10-18-2003   SYNOVECTOMY  . LUMBAR SPINE SURGERY  x2    yrs ago  . ORIF HUMERUS FRACTURE Right 04-18-2008  . REPAIR FLEXOR TENDON HAND Right 09-20-2008   CARPI RADIALIS TENDON TRANSFER TO EXTENSOR TO INDEX, MIDDLE, RING , LITTLE FINGERS AND DIGITI MININI  . ROTATOR CUFF REPAIR Right 1995  . TOTAL ABDOMINAL HYSTERECTOMY W/ BILATERAL SALPINGOOPHORECTOMY  age 68  . TRANSTHORACIC ECHOCARDIOGRAM  12-12-2013   MILD LVH/  EF 60-65%/  GRADE I DIASTOLIC DYSFUNCTION/  MILD LAE     OB History    Gravida  7   Para  7   Term  7   Preterm      AB      Living  4     SAB      TAB      Ectopic      Multiple        Live Births               Home Medications    Prior to Admission medications   Medication Sig Start Date End Date Taking? Authorizing Provider  amLODipine (NORVASC) 5 MG tablet Take 5 mg by mouth daily. 07/03/17  Yes [provider]  aspirin EC 81 MG tablet Take 1 tablet (81 mg total) by mouth daily. 11/03/18  Yes Rhetta Mura, MD  benzonatate (TESSALON) 200 MG capsule Take 1 capsule (200 mg total) by mouth 3 (three) times daily as needed for cough. 01/14/19  Yes Pollina, Canary Brim, MD  citalopram (CELEXA) 20 MG tablet Take 1 tablet (  20 mg total) by mouth daily. 09/21/18  Yes Oval Linsey, MD  clopidogrel (PLAVIX) 75 MG tablet Take 1 tablet (75 mg total) by mouth daily for 30 days. 02/22/19 03/24/19 Yes Johnson, Clanford L, MD  fluconazole (DIFLUCAN) 200 MG tablet Take 1 tablet (200 mg total) by mouth daily for 3 days. 02/23/19 02/26/19 Yes Johnson, Clanford L, MD  gabapentin (NEURONTIN) 300 MG capsule Take 300 mg by mouth 2 (two) times daily. 09/18/17  Yes [provider]  insulin NPH-regular Human (70-30) 100 UNIT/ML injection Inject 45 Units into the skin 2 (two) times daily. 12/17/18  Yes Shah, Pratik D, DO  ipratropium-albuterol (DUONEB) 0.5-2.5 (3) MG/3ML SOLN Inhale 3 mLs into the lungs 4 (four) times daily as needed (for shortness of breath).  09/19/17  Yes [provider]  levothyroxine (SYNTHROID, LEVOTHROID) 25 MCG tablet Take 25 mcg by mouth daily.    Yes [provider]  lisinopril (PRINIVIL,ZESTRIL) 10 MG tablet Take 10 mg by mouth daily.  01/14/19  Yes [provider]  LORazepam (ATIVAN) 1 MG tablet Take 1 tablet (1 mg total) by mouth at bedtime. 09/20/18  Yes Oval Linsey, MD  metFORMIN (GLUCOPHAGE) 500 MG tablet Take 500 mg by mouth 2 (two) times daily with a meal.     Yes [provider]  nitroGLYCERIN (NITROSTAT) 0.4 MG SL tablet Place 0.4 mg under the tongue every 5 (five) minutes as needed for chest pain.     Yes [provider]  oxyCODONE (OXY IR/ROXICODONE) 5 MG immediate release tablet Take 1 tablet (5 mg total) by mouth every 4 (four) hours as needed (Moderate pain). 06/12/18  Yes Oval Linsey, MD  pantoprazole (PROTONIX) 40 MG tablet Take 1 tablet (40 mg total) by mouth daily at 6 (six) AM. 02/21/19  Yes Memon, Durward Mallard, MD  simvastatin (ZOCOR) 20 MG tablet Take 20 mg by mouth daily. 12/03/18  Yes [provider]    Family History Family History  Problem Relation Age of Onset  . Hypertension Mother   . Stroke Mother   . Coronary artery disease Other        Multiple first and second-degree relatives  . Aneurysm Other        Cerebral circulation  . Colon cancer Sister   . Stroke Sister   . Coronary artery disease Sister   . Clotting disorder Other        Children diagnosed with hypercoagulable state  . Coronary artery disease Brother     Social History Social History   Tobacco Use  . Smoking status: Never Smoker  . Smokeless tobacco: Never Used  Substance Use Topics  . Alcohol use: No  . Drug use: No     Allergies   Codeine   Review of Systems Review of Systems  All other systems reviewed and are negative.    Physical Exam Updated Vital Signs BP (!) 158/66   Pulse (!) 56   Temp 97.6 F (36.4 C) (Oral)   Resp 11   Ht 5\' 4"  (1.626 m)   Wt 91.6 kg   SpO2 97%   BMI 34.66 kg/m   Physical Exam Vitals signs and nursing note reviewed.  Constitutional:      General: She is not in acute distress.    Appearance: She is well-developed. She is obese. She is not ill-appearing, toxic-appearing or diaphoretic.     Comments: Elderly, frail  HENT:     Head: Normocephalic and atraumatic.     Right Ear: External  ear normal.     Left Ear: External ear normal.     Mouth/Throat:     Mouth: Mucous membranes are moist.     Pharynx: No oropharyngeal exudate or posterior oropharyngeal erythema.  Eyes:     Conjunctiva/sclera: Conjunctivae normal.      Pupils: Pupils are equal, round, and reactive to light.  Neck:     Musculoskeletal: Normal range of motion and neck supple.     Trachea: Phonation normal.  Cardiovascular:     Rate and Rhythm: Regular rhythm. Bradycardia present.     Heart sounds: Normal heart sounds.  Pulmonary:     Effort: Pulmonary effort is normal. No respiratory distress.     Breath sounds: Normal breath sounds. No stridor.  Abdominal:     Palpations: Abdomen is soft.     Tenderness: There is no abdominal tenderness.  Musculoskeletal: Normal range of motion.        General: No swelling or tenderness.     Right lower leg: Edema (1+) present.     Left lower leg: Edema (1+) present.  Skin:    General: Skin is warm and dry.  Neurological:     Mental Status: She is alert and oriented to person, place, and time.     Cranial Nerves: No cranial nerve deficit.     Sensory: No sensory deficit.     Motor: No abnormal muscle tone.     Coordination: Coordination normal.     Comments: No dysarthria, or aphasia.  Strength right arm and right leg 3/5.  Strength left arm and left leg 2/5.  Psychiatric:        Mood and Affect: Mood normal.        Behavior: Behavior normal.      ED Treatments / Results  Labs (all labs ordered are listed, but only abnormal results are displayed) Labs Reviewed  BASIC METABOLIC PANEL - Abnormal; Notable for the following components:      Result Value   Glucose, Bld 54 (*)    BUN 28 (*)    All other components within normal limits  CBC WITH DIFFERENTIAL/PLATELET - Abnormal; Notable for the following components:   WBC 13.7 (*)    RBC 5.35 (*)    MCH 23.7 (*)    MCHC 29.4 (*)    RDW 16.7 (*)    Neutro Abs 8.4 (*)    Monocytes Absolute 1.6 (*)    Abs Immature Granulocytes 0.16 (*)    All other components within normal limits  CBG MONITORING, ED    EKG None  Radiology No results found.  Procedures Procedures (including critical care time)  Medications Ordered in  ED Medications - No data to display   Initial Impression / Assessment and Plan / ED Course  I have reviewed the triage vital signs and the nursing notes.  Pertinent labs & imaging results that were available during my care of the patient were reviewed by me and considered in my medical decision making (see chart for details).  Clinical Course as of Feb 22 2247  Mon Feb 22, 2019  2225 Normal except glucose low, BUN high  Basic metabolic panel(!) [EW]  2225 Normal except white count high, red cell indices low  CBC with Differential(!) [EW]  2247 Low, after eating  POC CBG, ED(!) [EW]    Clinical Course User Index [EW] Mancel BaleWentz, Montie Swiderski, MD        Patient Vitals for the past 24 hrs:  BP Temp Temp src  Pulse Resp SpO2 Height Weight  02/22/19 2130 (!) 158/66 - - (!) 56 11 97 % - -  02/22/19 2047 (!) 174/59 97.6 F (36.4 C) Oral (!) 57 10 94 % - -  02/22/19 2038 - - - - - -  (1.626 m) 91.6 kg    10:27 PM Reevaluation with update and discussion. After initial assessment and treatment, an updated evaluation reveals no change in clinical status, she remains alert.  She is lucid.  Memory is poor.  Patient and family members updated on findings and plan. Mancel Bale   Medical Decision Making: Weakness, primarily left-sided, recently evaluated and treated for same.  Per neurology she was treated with high-dose steroids for several days, beginning 02/18/2019.  Family members report the patient's symptoms have persisted.  Screening labs fairly reassuring, glucose somewhat low, taken before patient ate.  CRITICAL CARE- No Performed by: Mancel Bale  Nursing Notes Reviewed/ Care Coordinated Applicable Imaging Reviewed Interpretation of Laboratory Data incorporated into ED treatment   10:30PM-Consult complete with hospitalist. Patient case explained and discussed.  He agrees to admit patient for further evaluation and treatment. Call ended at 10:35 PM  Plan: Admit  Final Clinical  Impressions(s) / ED Diagnoses   Final diagnoses:  Malaise  Weakness  Hypoglycemia    ED Discharge Orders    None       Mancel Bale, MD 02/22/19 2249

## 2019-02-22 NOTE — ED Notes (Signed)
ED TO INPATIENT HANDOFF REPORT  ED Nurse Name and Phone #: Maralyn Sago [phone]  S Name/Age/Gender [name] [name] 83 y.o. female Room/Bed: APA09/APA09  Code Status   Code Status: Full Code  Home/SNF/Other Rehab Patient oriented to: self, place, time and situation Is this baseline? Yes   Triage Complete: Triage complete  Chief Complaint weakness  Triage Note Pt brought in by [facility] for c/o fall and weakness; pt was just discharged from 300 for weakness; family told ems they wanted her to be evaluated because they could not care for her at home; cbg 89   Allergies Allergies  Allergen Reactions  . Codeine Itching    Level of Care/Admitting Diagnosis ED Disposition    ED Disposition Condition Comment   Admit  Hospital Area: [facility] [[id number]]  Level of Care: Med-Surg [16]  Diagnosis: Hypoglycemia [786767]  Admitting Physician: Briscoe Deutscher [[id number]]  Attending Physician: Briscoe Deutscher [[id number]]  PT Class (Do Not Modify): Observation [104]  PT Acc Code (Do Not Modify): Observation [10022]       B Medical/Surgery History Past Medical History:  Diagnosis Date  . Arthritis   . CAD (coronary artery disease)    Multivessel status post CABG 2001  . Chronic obstructive pulmonary disease (HCC)   . Chronic pain   . DDD (degenerative disc disease), lumbar   . Diastolic CHF (HCC)   . Essential hypertension   . Hematuria   . History of CVA (cerebrovascular accident)    2013 - lacunar infarct right thalamus - residual left side of  lip numb  and left eye vision worse (which corrented lens implant from cataract extraction)  . History of MI (myocardial infarction)    July 2001  . Hyperlipidemia   . Hypothyroidism   . Insulin dependent type 2 diabetes mellitus (HCC)   . Lesion of bladder   . Lower urinary tract symptoms (LUTS)   . OSA (obstructive sleep apnea)   . Peripheral neuropathy   . Psoriasis   . SUI (stress urinary incontinence, female)   . Varicose  veins    Past Surgical History:  Procedure Laterality Date  . APPENDECTOMY  age 42  . CARDIAC CATHETERIZATION  1991   No sig. cad  . CARDIAC CATHETERIZATION  07-01-2000   high grade in-stent restenosis  . CARDIAC CATHETERIZATION  04-30-2001   single native vessel cad/ graft patent  . CARDIAC CATHETERIZATION  02-13-2006  dr Jenness Corner cad with total occluded LAD  and  70% ostial of small ramus/  grafts patent/  normal ef  . CARDIAC CATHETERIZATION  01-20-2000  dr Clifton James   essentially unchanged;  severe single vessel cad with diffuse disease throughout CFX and RCA/ ef 66%;  chf with preserved LVSF  . CARDIOVASCULAR STRESS TEST  08-28-2011   DR ROTHBART   NEGATIVE NUCLEAR STUDY/  GLOBAL LVSF/  EF 65%  . CARPAL TUNNEL RELEASE Bilateral yrs ago  . CATARACT EXTRACTION W/ INTRAOCULAR LENS  IMPLANT, BILATERAL  2013  . CHOLECYSTECTOMY  1990  . COLONOSCOPY N/A 07/26/2015   Procedure: COLONOSCOPY;  Surgeon: Malissa Hippo, MD;  [facility];  Service: Endoscopy;  Laterality: N/A;  1200  . CORONARY ANGIOPLASTY  11-13-1999   balloon angioplasty to mLAD , d2 of LAD  . CORONARY ANGIOPLASTY WITH STENT PLACEMENT  01-17-2000   PCI stenting to mLAD & D2 of LAD  . CORONARY ARTERY BYPASS GRAFT  07-02-2000   DR Tressie Stalker   SVG to  diagonal, LIMA to LAD  . CYSTOSCOPY W/ RETROGRADES Bilateral 08/09/2014   Procedure: CYSTOSCOPY, BILATERAL RETROGRADE, HYDRODISTENSION, BLADDER BIOPSY WITH FULGERATION, INSTILL PYRIDIUM AND MARCAINE ;  Surgeon: Anner Crete, MD;  Location: [facility];  Service: Urology;  Laterality: Bilateral;  . KNEE ARTHROSCOPY Left 10-18-2003   SYNOVECTOMY  . LUMBAR SPINE SURGERY  x2    yrs ago  . ORIF HUMERUS FRACTURE Right 04-18-2008  . REPAIR FLEXOR TENDON HAND Right 09-20-2008   CARPI RADIALIS TENDON TRANSFER TO EXTENSOR TO INDEX, MIDDLE, RING , LITTLE FINGERS AND DIGITI MININI  . ROTATOR CUFF REPAIR Right 1995  . TOTAL ABDOMINAL HYSTERECTOMY W/ BILATERAL  SALPINGOOPHORECTOMY  age 33  . TRANSTHORACIC ECHOCARDIOGRAM  12-12-2013   MILD LVH/  EF 60-65%/  GRADE I DIASTOLIC DYSFUNCTION/  MILD LAE     A IV Location/Drains/Wounds Patient Lines/Drains/Airways Status   Active Line/Drains/Airways    Name:   Placement date:   Placement time:   Site:   Days:   Peripheral IV 02/22/19 Left Hand   02/22/19    2304    Hand   less than 1   External Urinary Catheter   02/12/19    1448    -   10          Intake/Output Last 24 hours No intake or output data in the 24 hours ending 02/22/19 2305  Labs/Imaging Results for orders placed or performed during the hospital encounter of 02/22/19 (from the past 48 hour(s))  Basic metabolic panel     Status: Abnormal   Collection Time: 02/22/19  9:29 PM  Result Value Ref Range   Sodium 137 135 - 145 mmol/L   Potassium 4.2 3.5 - 5.1 mmol/L   Chloride 102 98 - 111 mmol/L   CO2 28 22 - 32 mmol/L   Glucose, Bld 54 (L) 70 - 99 mg/dL   BUN 28 (H) 8 - 23 mg/dL   Creatinine, Ser 4.38 0.44 - 1.00 mg/dL   Calcium 9.3 8.9 - 38.1 mg/dL   GFR calc non Af Amer >60 >60 mL/min   GFR calc Af Amer >60 >60 mL/min   Anion gap 7 5 - 15    Comment: Performed at [facility], [address], [place], [place] [address]  CBC with Differential     Status: Abnormal   Collection Time: 02/22/19  9:29 PM  Result Value Ref Range   WBC 13.7 (H) 4.0 - 10.5 K/uL   RBC 5.35 (H) 3.87 - 5.11 MIL/uL   Hemoglobin 12.7 12.0 - 15.0 g/dL   HCT 54.3 60.6 - 77.0 %   MCV 80.7 80.0 - 100.0 fL   MCH 23.7 (L) 26.0 - 34.0 pg   MCHC 29.4 (L) 30.0 - 36.0 g/dL   RDW 34.0 (H) 35.2 - 48.1 %   Platelets 369 150 - 400 K/uL   nRBC 0.0 0.0 - 0.2 %   Neutrophils Relative % 61 %   Neutro Abs 8.4 (H) 1.7 - 7.7 K/uL   Lymphocytes Relative 25 %   Lymphs Abs 3.4 0.7 - 4.0 K/uL   Monocytes Relative 12 %   Monocytes Absolute 1.6 (H) 0.1 - 1.0 K/uL   Eosinophils Relative 1 %   Eosinophils Absolute 0.1 0.0 - 0.5 K/uL   Basophils Relative 0 %   Basophils  Absolute 0.0 0.0 - 0.1 K/uL   Immature Granulocytes 1 %   Abs Immature Granulocytes 0.16 (H) 0.00 - 0.07 K/uL    Comment: Performed at  [facility], [address] [place], [place] [address]  POC CBG, ED     Status: Abnormal   Collection Time: 02/22/19 10:29 PM  Result Value Ref Range   Glucose-Capillary 56 (L) 70 - 99 mg/dL  CBG monitoring, ED     Status: None   Collection Time: 02/22/19 10:51 PM  Result Value Ref Range   Glucose-Capillary 84 70 - 99 mg/dL   No results found.  Pending Labs Unresulted Labs (From admission, onward)    Start     Ordered   03/01/19 0500  Creatinine, serum  (enoxaparin (LOVENOX)    CrCl >/= 30 ml/min)  Weekly,   R    Comments:  while on enoxaparin therapy    02/22/19 2304   02/23/19 0500  Basic metabolic panel  Tomorrow morning,   R     02/22/19 2304   02/23/19 0500  CBC WITH DIFFERENTIAL  Tomorrow morning,   R     02/22/19 2304          Vitals/Pain Today's Vitals   02/22/19 2038 02/22/19 2047 02/22/19 2130  BP:  (!) 174/59 (!) 158/66  Pulse:  (!) 57 (!) 56  Resp:  10 11  Temp:  97.6 F (36.4 C)   TempSrc:  Oral   SpO2:  94% 97%  Weight: 91.6 kg    Height:  (1.626 m)    PainSc: 0-No pain      Isolation Precautions No active isolations  Medications Medications  aspirin EC tablet 81 mg (has no administration in time range)  oxyCODONE (Oxy IR/ROXICODONE) immediate release tablet 5 mg (has no administration in time range)  fluconazole (DIFLUCAN) tablet 200 mg (has no administration in time range)  amLODipine (NORVASC) tablet 5 mg (has no administration in time range)  lisinopril (PRINIVIL,ZESTRIL) tablet 10 mg (has no administration in time range)  simvastatin (ZOCOR) tablet 20 mg (has no administration in time range)  citalopram (CELEXA) tablet 20 mg (has no administration in time range)  LORazepam (ATIVAN) tablet 1 mg (has no administration in time range)  levothyroxine (SYNTHROID, LEVOTHROID) tablet 25 mcg (has no  administration in time range)  pantoprazole (PROTONIX) EC tablet 40 mg (has no administration in time range)  gabapentin (NEURONTIN) capsule 300 mg (has no administration in time range)  clopidogrel (PLAVIX) tablet 75 mg (has no administration in time range)  benzonatate (TESSALON) capsule 200 mg (has no administration in time range)  ipratropium-albuterol (DUONEB) 0.5-2.5 (3) MG/3ML nebulizer solution 3 mL (has no administration in time range)  enoxaparin (LOVENOX) injection 40 mg (has no administration in time range)  0.9 %  sodium chloride infusion (has no administration in time range)  acetaminophen (TYLENOL) tablet 650 mg (has no administration in time range)    Or  acetaminophen (TYLENOL) suppository 650 mg (has no administration in time range)  polyethylene glycol (MIRALAX / GLYCOLAX) packet 17 g (has no administration in time range)  ondansetron (ZOFRAN) tablet 4 mg (has no administration in time range)    Or  ondansetron (ZOFRAN) injection 4 mg (has no administration in time range)  insulin aspart (novoLOG) injection 0-15 Units (has no administration in time range)  insulin aspart (novoLOG) injection 0-5 Units (has no administration in time range)    Mobility manual wheelchair High fall risk   Focused Assessments    R Recommendations: See Admitting Provider Note  Report given to:   Additional Notes: n/a

## 2019-02-22 NOTE — H&P (Signed)
History and Physical    [name] [name] EIH:[id number] DOB: 09-17-1936 DOA: 02/22/2019  PCP: Oval Linsey, MD   Patient coming from: Home   Chief Complaint: Fall, generalized weakness   HPI: [name] [name] is a 83 y.o. female with medical history significant for hypertension, insulin-dependent diabetes mellitus, history of CVA, chronic back pain, generalized weakness and debility, hypothyroidism, COPD, and OSA, now presenting to the emergency department after a fall at home.  The patient had just been discharged from the hospital several hours earlier after admission for weakness and debility.  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  Family was assisting her with ambulation this evening when she began to fall.  She was eased to the ground, did not hit her head, and denies any injury from this.  She denies any acute weakness, or any change in her condition since leaving the hospital, but reports that she is too weak to get up on her own and family was not strong enough to help her up either.  They called EMS and she was brought back to the ED.  Patient denies fevers, chills, headache, acute numbness or weakness, chest pain, palpitations, cough, or any change in her chronic exertional dyspnea.  Dysuria has improved and she denies flank pain.   ED Course: Upon arrival to the ED, patient is found to be afebrile, saturating well on room air, slightly bradycardic, and with vitals otherwise normal.  Chemistry panel is notable for serum glucose of 54 and CBC features a leukocytosis to 13,700.  Patient was too weak in general to get up in the ED without intensive assistance.  She is tolerating food without incident in the ED.  She will be observed in the hospital for ongoing evaluation  and management of hypoglycemia and an insulin-dependent diabetic.  Review of Systems:  All other systems reviewed and apart from HPI, are negative.  Past Medical History:  Diagnosis Date  . Arthritis   . CAD (coronary artery disease)    Multivessel status post CABG 2001  . Chronic obstructive pulmonary disease (HCC)   . Chronic pain   . DDD (degenerative disc disease), lumbar   . Diastolic CHF (HCC)   . Essential hypertension   . Hematuria   . History of CVA (cerebrovascular accident)    2013 - lacunar infarct right thalamus - residual left side of  lip numb  and left eye vision worse (which corrented lens implant from cataract extraction)  . History of MI (myocardial infarction)    July 2001  . Hyperlipidemia   . Hypothyroidism   . Insulin dependent type 2 diabetes mellitus (HCC)   . Lesion of bladder   . Lower urinary tract symptoms (LUTS)   . OSA (obstructive sleep apnea)   . Peripheral neuropathy   . Psoriasis   . SUI (stress urinary incontinence, female)   . Varicose veins     Past Surgical History:  Procedure Laterality Date  . APPENDECTOMY  age 45  . CARDIAC CATHETERIZATION  1991   No sig. cad  . CARDIAC CATHETERIZATION  07-01-2000   high grade in-stent restenosis  . CARDIAC CATHETERIZATION  04-30-2001   single native vessel cad/ graft patent  . CARDIAC CATHETERIZATION  02-13-2006  dr [place] cad with total  occluded LAD  and  70% ostial of small ramus/  grafts patent/  normal ef  . CARDIAC CATHETERIZATION  01-20-2000  dr Clifton James   essentially unchanged;  severe single vessel cad with diffuse disease throughout CFX and RCA/ ef 66%;  chf with preserved LVSF  . CARDIOVASCULAR STRESS TEST  08-28-2011   DR ROTHBART   NEGATIVE NUCLEAR STUDY/  GLOBAL LVSF/  EF 65%  . CARPAL TUNNEL RELEASE Bilateral yrs ago  . CATARACT EXTRACTION W/ INTRAOCULAR LENS  IMPLANT, BILATERAL  2013  . CHOLECYSTECTOMY  1990  . COLONOSCOPY N/A 07/26/2015   Procedure: COLONOSCOPY;  Surgeon:  Malissa Hippo, MD;  Location[facility];  Service: Endoscopy;  Laterality: N/A;  1200  . CORONARY ANGIOPLASTY  11-13-1999   balloon angioplasty to mLAD , d2 of LAD  . CORONARY ANGIOPLASTY WITH STENT PLACEMENT  01-17-2000   PCI stenting to mLAD & D2 of LAD  . CORONARY ARTERY BYPASS GRAFT  07-02-2000   DR Tressie Stalker   SVG to diagonal, LIMA to LAD  . CYSTOSCOPY W/ RETROGRADES Bilateral 08/09/2014   Procedure: CYSTOSCOPY, BILATERAL RETROGRADE, HYDRODISTENSION, BLADDER BIOPSY WITH FULGERATION, INSTILL PYRIDIUM AND MARCAINE ;  Surgeon: Anner Crete, MD;  Location: [facility];  Service: Urology;  Laterality: Bilateral;  . KNEE ARTHROSCOPY Left 10-18-2003   SYNOVECTOMY  . LUMBAR SPINE SURGERY  x2    yrs ago  . ORIF HUMERUS FRACTURE Right 04-18-2008  . REPAIR FLEXOR TENDON HAND Right 09-20-2008   CARPI RADIALIS TENDON TRANSFER TO EXTENSOR TO INDEX, MIDDLE, RING , LITTLE FINGERS AND DIGITI MININI  . ROTATOR CUFF REPAIR Right 1995  . TOTAL ABDOMINAL HYSTERECTOMY W/ BILATERAL SALPINGOOPHORECTOMY  age 83  . TRANSTHORACIC ECHOCARDIOGRAM  12-12-2013   MILD LVH/  EF 60-65%/  GRADE I DIASTOLIC DYSFUNCTION/  MILD LAE     reports that she has never smoked. She has never used smokeless tobacco. She reports that she does not drink alcohol or use drugs.  Allergies  Allergen Reactions  . Codeine Itching    Family History  Problem Relation Age of Onset  . Hypertension Mother   . Stroke Mother   . Coronary artery disease Other        Multiple first and second-degree relatives  . Aneurysm Other        Cerebral circulation  . Colon cancer Sister   . Stroke Sister   . Coronary artery disease Sister   . Clotting disorder Other        Children diagnosed with hypercoagulable state  . Coronary artery disease Brother      Prior to Admission medications   Medication Sig Start Date End Date Taking? Authorizing Provider  amLODipine (NORVASC) 5 MG tablet Take 5 mg by mouth daily.  07/03/17  Yes [provider]  aspirin EC 81 MG tablet Take 1 tablet (81 mg total) by mouth daily. 11/03/18  Yes Rhetta Mura, MD  benzonatate (TESSALON) 200 MG capsule Take 1 capsule (200 mg total) by mouth 3 (three) times daily as needed for cough. 01/14/19  Yes Pollina, Canary Brim, MD  citalopram (CELEXA) 20 MG tablet Take 1 tablet (20 mg total) by mouth daily. 09/21/18  Yes Oval Linsey, MD  clopidogrel (PLAVIX) 75 MG tablet Take 1 tablet (75 mg total) by mouth daily for 30 days. 02/22/19 03/24/19 Yes Johnson, Clanford L, MD  fluconazole (DIFLUCAN) 200 MG tablet Take 1 tablet (200 mg total) by mouth daily for 3 days. 02/23/19 02/26/19 Yes Johnson,  Clanford L, MD  gabapentin (NEURONTIN) 300 MG capsule Take 300 mg by mouth 2 (two) times daily. 09/18/17  Yes [provider]  insulin NPH-regular Human (70-30) 100 UNIT/ML injection Inject 45 Units into the skin 2 (two) times daily. 12/17/18  Yes Shah, Pratik D, DO  ipratropium-albuterol (DUONEB) 0.5-2.5 (3) MG/3ML SOLN Inhale 3 mLs into the lungs 4 (four) times daily as needed (for shortness of breath).  09/19/17  Yes [provider]  levothyroxine (SYNTHROID, LEVOTHROID) 25 MCG tablet Take 25 mcg by mouth daily.    Yes [provider]  lisinopril (PRINIVIL,ZESTRIL) 10 MG tablet Take 10 mg by mouth daily.  01/14/19  Yes [provider]  LORazepam (ATIVAN) 1 MG tablet Take 1 tablet (1 mg total) by mouth at bedtime. 09/20/18  Yes Oval Linseyondiego, Richard, MD  metFORMIN (GLUCOPHAGE) 500 MG tablet Take 500 mg by mouth 2 (two) times daily with a meal.     Yes [provider]  nitroGLYCERIN (NITROSTAT) 0.4 MG SL tablet Place 0.4 mg under the tongue every 5 (five) minutes as needed for chest pain.    Yes [provider]  oxyCODONE (OXY IR/ROXICODONE) 5 MG immediate release tablet Take 1 tablet (5 mg total) by mouth every 4 (four) hours as needed (Moderate pain). 06/12/18  Yes Oval Linseyondiego, Richard, MD    pantoprazole (PROTONIX) 40 MG tablet Take 1 tablet (40 mg total) by mouth daily at 6 (six) AM. 02/21/19  Yes Memon, Durward MallardJehanzeb, MD  simvastatin (ZOCOR) 20 MG tablet Take 20 mg by mouth daily. 12/03/18  Yes [provider]    Physical Exam: Vitals:   02/22/19 2038 02/22/19 2047 02/22/19 2130  BP:  (!) 174/59 (!) 158/66  Pulse:  (!) 57 (!) 56  Resp:  10 11  Temp:  97.6 F (36.4 C)   TempSrc:  Oral   SpO2:  94% 97%  Weight: 91.6 kg    Height: 5\' 4"  (1.626 m)      Constitutional: NAD, calm  Eyes: PERTLA, lids and conjunctivae normal ENMT: Mucous membranes are moist. Posterior pharynx clear of any exudate or lesions.   Neck: normal, supple, no masses, no thyromegaly Respiratory: clear to auscultation bilaterally, no wheezing, no crackles. Normal respiratory effort.  .  Cardiovascular: S1 & S2 heard, regular rate and rhythm.  No significant JVD. Abdomen: No distension, no tenderness, soft. Bowel sounds normal.  Musculoskeletal: no clubbing / cyanosis. No joint deformity upper and lower extremities.   Skin: no significant rashes, lesions, ulcers. Warm, dry, well-perfused. Neurologic: No facial asymmetry. Sensation intact. Moving all extremities.  Psychiatric:  Alert and oriented x 3. Pleasant and cooperative.    Labs on Admission: I have personally reviewed following labs and imaging studies  CBC: Recent Labs  Lab 02/17/19 1313 02/22/19 2129  WBC 9.9 13.7*  NEUTROABS  --  8.4*  HGB 11.7* 12.7  HCT 40.6 43.2  MCV 81.2 80.7  PLT 308 369   Basic Metabolic Panel: Recent Labs  Lab 02/17/19 1313 02/19/19 0550 02/19/19 1133 02/20/19 0649 02/22/19 2129  NA 136  --   --  139 137  K 4.1  --   --  4.0 4.2  CL 103  --   --  108 102  CO2 26  --   --  23 28  GLUCOSE 213*  --  435* 279* 54*  BUN 12  --   --  19 28*  CREATININE 0.88 0.70  --  0.70 0.79  CALCIUM 9.1  --   --  9.0 9.3   GFR: Estimated Creatinine Clearance: 61.5 mL/min (by C-G formula based on SCr of 0.79  mg/dL). Liver Function Tests: Recent Labs  Lab 02/17/19 1313  AST 16  ALT 18  ALKPHOS 64  BILITOT 0.4  PROT 6.4*  ALBUMIN 3.4*   No results for input(s): LIPASE, AMYLASE in the last 168 hours. No results for input(s): AMMONIA in the last 168 hours. Coagulation Profile: No results for input(s): INR, PROTIME in the last 168 hours. Cardiac Enzymes: No results for input(s): CKTOTAL, CKMB, CKMBINDEX, TROPONINI in the last 168 hours. BNP (last 3 results) No results for input(s): PROBNP in the last 8760 hours. HbA1C: No results for input(s): HGBA1C in the last 72 hours. CBG: Recent Labs  Lab 02/22/19 0750 02/22/19 1142 02/22/19 1615 02/22/19 2229 02/22/19 2251  GLUCAP 172* 346* 163* 56* 84   Lipid Profile: No results for input(s): CHOL, HDL, LDLCALC, TRIG, CHOLHDL, LDLDIRECT in the last 72 hours. Thyroid Function Tests: No results for input(s): TSH, T4TOTAL, FREET4, T3FREE, THYROIDAB in the last 72 hours. Anemia Panel: No results for input(s): VITAMINB12, FOLATE, FERRITIN, TIBC, IRON, RETICCTPCT in the last 72 hours. Urine analysis:    Component Value Date/Time   COLORURINE YELLOW 02/12/2019 0823   APPEARANCEUR CLOUDY (A) 02/12/2019 0823   LABSPEC 1.024 02/12/2019 0823   PHURINE 5.0 02/12/2019 0823   GLUCOSEU 150 (A) 02/12/2019 0823   HGBUR NEGATIVE 02/12/2019 0823   BILIRUBINUR NEGATIVE 02/12/2019 0823   KETONESUR NEGATIVE 02/12/2019 0823   PROTEINUR 100 (A) 02/12/2019 0823   UROBILINOGEN 0.2 08/29/2015 1842   NITRITE NEGATIVE 02/12/2019 0823   LEUKOCYTESUR LARGE (A) 02/12/2019 0823   Sepsis Labs: @LABRCNTIP (procalcitonin:4,lacticidven:4) ) Recent Results (from the past 240 hour(s))  Urine Culture     Status: None   Collection Time: 02/12/19 11:45 PM  Result Value Ref Range Status   Specimen Description   Final    URINE, CLEAN CATCH Performed at [facility], [address], [place], [place] [address]    Special Requests   Final    NONE Performed at [facility], [address], [place], [place] [address]    Culture   Final    NO GROWTH Performed at [facility], [address] [place], [place] [address]    Report Status 02/14/2019 FINAL  Final     Radiological Exams on Admission: No results found.  EKG: Not performed.   Assessment/Plan  1. Hypoglycemia; insulin-dependent DM  - Presents after a recurrent fall at home and found to have serum glucose of 54 in ED  - There is no acute illness evident and this is likely from taking her insulin and not eating much  - A1c was 8.5% last month and she is managed at home with insulin NPH 45 units BID  - She is eating in ED  - Repeat CBG now, as-needed for suspected hypoglycemia, and ACHS  - Start with sliding-scale insulin only for now and then resume basal coverage as indicated   2. Generalized weakness, debility  - SNF recommended by PT during recent admission, insurance would not cover, and she went home with [facility], but returns with ongoing weakness and debility and another fall at home with hypoglycemia possibly contributing  - She was eased to the ground by family and did not hit her head or suffer any appreciable injury  - Family at bedside does not want to take her home again, feeling that they are not equipped to help her d/t  being physically unable to get her up when she is too weak to stand on her own  - Social work consulted by ED physician    3. COPD; OSA  - No cough, wheeze, or SOB on admission  - Continue Spiriva, as-needed Duonebs, and CPAP qHS   4. CAD  - No anginal complaints  - Continue DAPT, statin, and ACE    5. Hypertension  - BP at goal  - Continue lisinopril and Norvasc as tolerated    6. Chronic back pain  - No pain complaints on admission  - Continue home regimen with Neurontin and as-needed oxycodone    7. Hypothyroidism  - TSH wnl two wks ago  - Continue Synthroid    8. Yeast UTI  - Continue Fluconazole     DVT prophylaxis: Lovenox  Code  Status: Full  Family Communication: Family updated at bedside Consults called: None Admission status: Observation     Briscoe Deutscher, MD [facility] Pager [phone]  If 7PM-7AM, please contact night-coverage [email or web address] Password [facility]  02/22/2019, 11:04 PM

## 2019-02-22 NOTE — Discharge Instructions (Signed)
°  IMPORTANT INFORMATION: PAY CLOSE ATTENTION  ° °PHYSICIAN DISCHARGE INSTRUCTIONS ° °Follow with Primary care provider  [name], Richard, MD  and other consultants as instructed your Hospitalist Physician ° °SEEK MEDICAL CARE OR RETURN TO EMERGENCY ROOM IF SYMPTOMS COME BACK, WORSEN OR NEW PROBLEM DEVELOPS.  ° °Please note: °You were cared for by a hospitalist during your hospital stay. Every effort will be made to forward records to your primary care provider.  You can request that your primary care provider send for your hospital records if they have not received them.  Once you are discharged, your primary care physician will handle any further medical issues. Please note that NO REFILLS for any discharge medications will be authorized once you are discharged, as it is imperative that you return to your primary care physician (or establish a relationship with a primary care physician if you do not have one) for your post hospital discharge needs so that they can reassess your need for medications and monitor your lab values. ° °Please get a complete blood count and chemistry panel checked by your Primary MD at your next visit, and again as instructed by your Primary MD. ° °Get Medicines reviewed and adjusted: °Please take all your medications with you for your next visit with your Primary MD ° °Laboratory/radiological data: °Please request your Primary MD to go over all hospital tests and procedure/radiological results at the follow up, please ask your primary care provider to get all Hospital records sent to his/her office. ° °In some cases, they will be blood work, cultures and biopsy results pending at the time of your discharge. Please request that your primary care provider follow up on these results. ° °If you are diabetic, please bring your blood sugar readings with you to your follow up appointment with primary care.   ° °Please call and make your follow up appointments as soon as possible.   ° °Also  Note the following: °If you experience worsening of your admission symptoms, develop shortness of breath, life threatening emergency, suicidal or homicidal thoughts you must seek medical attention immediately by calling 911 or calling your MD immediately  if symptoms less severe. ° °You must read complete instructions/literature along with all the possible adverse reactions/side effects for all the Medicines you take and that have been prescribed to you. Take any new Medicines after you have completely understood and accpet all the possible adverse reactions/side effects.  ° °Do not drive when taking Pain medications or sleeping medications (Benzodiazepines) ° °Do not take more than prescribed Pain, Sleep and Anxiety Medications. It is not advisable to combine anxiety,sleep and pain medications without talking with your primary care practitioner ° °Special Instructions: If you have smoked or chewed Tobacco  in the last 2 yrs please stop smoking, stop any regular Alcohol  and or any Recreational drug use. ° °Wear Seat belts while driving. ° ° ° ° ° ° °

## 2019-02-22 NOTE — Clinical Social Work Note (Signed)
CSW following. Informed that pt's insurance has again denied SNF rehab. Met with pt to update. Pt states she will go home with [facility] from [facility] as she has done in the past. Discussed [facility] program referral with pt as well and she is agreeable. Will make referrals. Updated MD. No other CSW needs for dc.

## 2019-02-23 ENCOUNTER — Other Ambulatory Visit: Payer: Self-pay

## 2019-02-23 DIAGNOSIS — E162 Hypoglycemia, unspecified: Secondary | ICD-10-CM | POA: Diagnosis not present

## 2019-02-23 LAB — GLUCOSE, CAPILLARY
Glucose-Capillary: 200 mg/dL — ABNORMAL HIGH (ref 70–99)
Glucose-Capillary: 353 mg/dL — ABNORMAL HIGH (ref 70–99)

## 2019-02-23 LAB — CBC WITH DIFFERENTIAL/PLATELET
Abs Immature Granulocytes: 0.1 10*3/uL — ABNORMAL HIGH (ref 0.00–0.07)
Basophils Absolute: 0 10*3/uL (ref 0.0–0.1)
Basophils Relative: 0 %
Eosinophils Absolute: 0.1 10*3/uL (ref 0.0–0.5)
Eosinophils Relative: 1 %
HCT: 42 % (ref 36.0–46.0)
Hemoglobin: 12.2 g/dL (ref 12.0–15.0)
Immature Granulocytes: 1 %
LYMPHS PCT: 26 %
Lymphs Abs: 3.7 10*3/uL (ref 0.7–4.0)
MCH: 23.3 pg — ABNORMAL LOW (ref 26.0–34.0)
MCHC: 29 g/dL — ABNORMAL LOW (ref 30.0–36.0)
MCV: 80.3 fL (ref 80.0–100.0)
Monocytes Absolute: 1.2 10*3/uL — ABNORMAL HIGH (ref 0.1–1.0)
Monocytes Relative: 8 %
Neutro Abs: 9.1 10*3/uL — ABNORMAL HIGH (ref 1.7–7.7)
Neutrophils Relative %: 64 %
Platelets: 321 10*3/uL (ref 150–400)
RBC: 5.23 MIL/uL — ABNORMAL HIGH (ref 3.87–5.11)
RDW: 16.8 % — ABNORMAL HIGH (ref 11.5–15.5)
WBC: 14.3 10*3/uL — ABNORMAL HIGH (ref 4.0–10.5)
nRBC: 0 % (ref 0.0–0.2)

## 2019-02-23 LAB — BASIC METABOLIC PANEL
Anion gap: 7 (ref 5–15)
BUN: 21 mg/dL (ref 8–23)
CO2: 28 mmol/L (ref 22–32)
Calcium: 8.5 mg/dL — ABNORMAL LOW (ref 8.9–10.3)
Chloride: 102 mmol/L (ref 98–111)
Creatinine, Ser: 0.7 mg/dL (ref 0.44–1.00)
GFR calc Af Amer: >60 mL/min (ref >60)
GFR calc non Af Amer: >60 mL/min (ref >60)
GLUCOSE: 235 mg/dL — AB (ref 70–99)
Potassium: 3.9 mmol/L (ref 3.5–5.1)
Sodium: 137 mmol/L (ref 135–145)

## 2019-02-23 NOTE — Care Management Obs Status (Signed)
MEDICARE OBSERVATION STATUS NOTIFICATION   Patient Details  Name: [name] [name] MRN: [id number] Date of Birth: October 17, 1936   Medicare Observation Status Notification Given:  Yes    Ric Rosenberg, Chrystine Oiler, RN 02/23/2019, 1:48 PM

## 2019-02-23 NOTE — ED Notes (Signed)
Family Members: [name] [phone] [facility]

## 2019-02-23 NOTE — Clinical Social Work Note (Addendum)
CSW consult received for placement needs. Pt was discharged from [facility] yesterday with [facility] and referral to [facility]. CSW had been attempting to place pt for SNF rehab but her insurance denied coverage and pt stated that she couldn't afford to pay privately so she would just go home.  Pt readmitted after family unable to provide care at home. Attempted to meet with pt this AM though she is very difficult to arouse. She will only open her eyes and talk for seconds at a time.   Spoke with pt's granddaughter, Golden Circle, on the phone. Golden Circle concerned that pt is not well enough to return home and had questions about insurance denial for SNF during previous hospitalization. Provided information as requested. Golden Circle states she is on her way here. Plan to meet with her to provide information on Medicaid application process and to further discuss transition of care needs.  12:25 Met with pt's daughter, gdtr, and great grandson to discuss pt's needs. They would like to try SNF rehab auth again. Discussed options for pt's care if insurance denies again. Provided contact information for [facility] to which a referral was made yesterday. Also provided information on contacting DSS for Medicaid eligibility information. Will update MD and continue to follow.

## 2019-02-23 NOTE — Progress Notes (Signed)
Inpatient Diabetes Program Recommendations  AACE/ADA: New Consensus Statement on Inpatient Glycemic Control (2015)  Target Ranges:  Prepandial:   less than 140 mg/dL      Peak postprandial:   less than 180 mg/dL (1-2 hours)      Critically ill patients:  140 - 180 mg/dL   Lab Results  Component Value Date   GLUCAP 123 (H) 02/22/2019   HGBA1C 8.5 (H) 02/18/2019    Review of Glycemic Control  Diabetes history: DM2 Outpatient Diabetes medications: 70/30 insulin mix 45 units bid + Metformin 500 mg bid Current orders for Inpatient glycemic control: Novolog moderate correction tid + hs  Inpatient Diabetes Program Recommendations:   -Decrease Novolog correction to sensitive correction  Thank you, Darel Hong E. Waver Dibiasio, RN, MSN, CDE  Diabetes Coordinator Inpatient Glycemic Control Team Team Pager [phone] (8am-5pm) 02/23/2019 9:58 AM

## 2019-02-23 NOTE — Progress Notes (Signed)
PROGRESS NOTE    [name] [name]  EYC:[id number] DOB: 1936/03/04 DOA: 02/22/2019 PCP: Oval Linsey, MD   Brief Narrative:  Per HPI:  [name] [name] is a 83 y.o. female with medical history significant for hypertension, insulin-dependent diabetes mellitus, history of CVA, chronic back pain, generalized weakness and debility, hypothyroidism, COPD, and OSA, now presenting to the emergency department after a fall at home.  The patient had just been discharged from the hospital several hours earlier after admission for weakness and debility.  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  Family was assisting her with ambulation this evening when she began to fall.  She was eased to the ground, did not hit her head, and denies any injury from this.  She denies any acute weakness, or any change in her condition since leaving the hospital, but reports that she is too weak to get up on her own and family was not strong enough to help her up either.  They called EMS and she was brought back to the ED.  Patient denies fevers, chills, headache, acute numbness or weakness, chest pain, palpitations, cough, or any change in her chronic exertional dyspnea.  Dysuria has improved and she denies flank pain.   Patient was readmitted with persistent generalized weakness with inability of family to care for patient at home.  She is also noted to have hypoglycemia in the setting of insulin-dependent diabetes.  She is on ongoing treatment for Candida UTI.  Assessment & Plan:   Principal Problem:   Hypoglycemia Active Problems:   Hypothyroidism   DM type 2 causing complication (HCC)   Essential hypertension   Chronic back pain   Chronic obstructive pulmonary disease (HCC)   OSA (obstructive sleep  apnea)   CAD (coronary artery disease)   Generalized weakness   Symptomatic Yeast UTI   1. Hypoglycemia-resolved; insulin-dependent DM  - Presents after a recurrent fall at home and found to have serum glucose of 54 in ED  - There is no acute illness evident and this is likely from taking her insulin and not eating much  - A1c was 8.5% last month and she is managed at home with insulin NPH 45 units BID  - She is eating in ED  - Start with sliding-scale insulin only for now and hold further basal coverage until blood glucose is elevated.  2. Generalized weakness, debility  - SNF recommended by PT during recent admission, insurance would not cover, and she went home with [facility], but returns with ongoing weakness and debility and another fall at home with hypoglycemia possibly contributing  - She was eased to the ground by family and did not hit her head or suffer any appreciable injury  - Family at bedside does not want to take her home again, feeling that they are not equipped to help her d/t being physically unable to get her up when she is too weak to stand on her own  - Social work working on placement to [employer].  3. COPD; OSA  - No cough, wheeze, or SOB on admission  - Continue Spiriva, as-needed Duonebs, and CPAP qHS   4. CAD  - No anginal complaints  - Continue DAPT, statin, and ACE    5. Hypertension  - BP at goal  -  Continue lisinopril and Norvasc as tolerated    6. Chronic back pain  - No pain complaints on admission  - Continue home regimen with Neurontin and as-needed oxycodone    7. Hypothyroidism  - TSH wnl two wks ago  - Continue Synthroid    8. Yeast UTI  - Continue Fluconazole until 3/6 -Continues to have some dysuria.   DVT prophylaxis: Lovenox Code Status: Full Family Communication: None at bedside Disposition Plan: Clinical social workers working on placement to SNF.  Holding Novolin 70/30 for now.  Blood glucose has improved.   Consultants:    None  Procedures:   None  Antimicrobials:   Fluconazole for fungal UTI.  Will complete treatment on 02/26/2019   Subjective: Patient seen and evaluated today with no new acute complaints or concerns. No acute concerns or events noted overnight.  She is awake and alert and having breakfast.  She complains of some mild dysuria.  Objective: Vitals:   02/22/19 2230 02/22/19 2341 02/22/19 2345 02/23/19 0506  BP: (!) 161/65  (!) 120/50 (!) 160/68  Pulse: 60  60 (!) 52  Resp: 13  17 20   Temp:   99.2 F (37.3 C) 98.5 F (36.9 C)  TempSrc:   Oral Oral  SpO2: 98%  95% 94%  Weight:  98.6 kg    Height:  5\' 4"  (1.626 m)      Intake/Output Summary (Last 24 hours) at 02/23/2019 1300 Last data filed at 02/23/2019 0850 Gross per 24 hour  Intake 503.52 ml  Output 450 ml  Net 53.52 ml   Filed Weights   02/22/19 2038 02/22/19 2341  Weight: 91.6 kg 98.6 kg    Examination:  General exam: Appears calm and comfortable  Respiratory system: Clear to auscultation. Respiratory effort normal. Cardiovascular system: S1 & S2 heard, RRR. No JVD, murmurs, rubs, gallops or clicks. No pedal edema. Gastrointestinal system: Abdomen is nondistended, soft and nontender. No organomegaly or masses felt. Normal bowel sounds heard. Central nervous system: Alert and oriented. No focal neurological deficits. Extremities: Symmetric 5 x 5 power. Skin: No rashes, lesions or ulcers Psychiatry: Difficult to assess.    Data Reviewed: I have personally reviewed following labs and imaging studies  CBC: Recent Labs  Lab 02/17/19 1313 02/22/19 2129  WBC 9.9 13.7*  NEUTROABS  --  8.4*  HGB 11.7* 12.7  HCT 40.6 43.2  MCV 81.2 80.7  PLT 308 369   Basic Metabolic Panel: Recent Labs  Lab 02/17/19 1313 02/19/19 0550 02/19/19 1133 02/20/19 0649 02/22/19 2129  NA 136  --   --  139 137  K 4.1  --   --  4.0 4.2  CL 103  --   --  108 102  CO2 26  --   --  23 28  GLUCOSE 213*  --  435* 279* 54*  BUN 12   --   --  19 28*  CREATININE 0.88 0.70  --  0.70 0.79  CALCIUM 9.1  --   --  9.0 9.3   GFR: Estimated Creatinine Clearance: 64 mL/min (by C-G formula based on SCr of 0.79 mg/dL). Liver Function Tests: Recent Labs  Lab 02/17/19 1313  AST 16  ALT 18  ALKPHOS 64  BILITOT 0.4  PROT 6.4*  ALBUMIN 3.4*   No results for input(s): LIPASE, AMYLASE in the last 168 hours. No results for input(s): AMMONIA in the last 168 hours. Coagulation Profile: No results for input(s): INR, PROTIME in the last 168 hours. Cardiac  Enzymes: No results for input(s): CKTOTAL, CKMB, CKMBINDEX, TROPONINI in the last 168 hours. BNP (last 3 results) No results for input(s): PROBNP in the last 8760 hours. HbA1C: No results for input(s): HGBA1C in the last 72 hours. CBG: Recent Labs  Lab 02/22/19 1142 02/22/19 1615 02/22/19 2229 02/22/19 2251 02/22/19 2345  GLUCAP 346* 163* 56* 84 123*   Lipid Profile: No results for input(s): CHOL, HDL, LDLCALC, TRIG, CHOLHDL, LDLDIRECT in the last 72 hours. Thyroid Function Tests: No results for input(s): TSH, T4TOTAL, FREET4, T3FREE, THYROIDAB in the last 72 hours. Anemia Panel: No results for input(s): VITAMINB12, FOLATE, FERRITIN, TIBC, IRON, RETICCTPCT in the last 72 hours. Sepsis Labs: No results for input(s): PROCALCITON, LATICACIDVEN in the last 168 hours.  No results found for this or any previous visit (from the past 240 hour(s)).       Radiology Studies: No results found.      Scheduled Meds: . amLODipine  5 mg Oral Daily  . aspirin EC  81 mg Oral Daily  . citalopram  20 mg Oral Daily  . clopidogrel  75 mg Oral Daily  . enoxaparin (LOVENOX) injection  40 mg Subcutaneous Q24H  . fluconazole  200 mg Oral Daily  . gabapentin  300 mg Oral BID  . insulin aspart  0-15 Units Subcutaneous TID WC  . insulin aspart  0-5 Units Subcutaneous QHS  . levothyroxine  25 mcg Oral Q0600  . lisinopril  10 mg Oral Daily  . LORazepam  1 mg Oral QHS  .  pantoprazole  40 mg Oral Daily  . simvastatin  20 mg Oral q1800   Continuous Infusions:   LOS: 0 days    Time spent: 30 minutes    Amariana Mirando Hoover BrunetteD Anitta Tenny, DO [facility] Pager [phone]  If 7PM-7AM, please contact night-coverage [email or web address] Password [email or web address] 02/23/2019, 1:00 PM

## 2019-02-24 DIAGNOSIS — E162 Hypoglycemia, unspecified: Secondary | ICD-10-CM | POA: Diagnosis not present

## 2019-02-24 LAB — GLUCOSE, CAPILLARY
GLUCOSE-CAPILLARY: 257 mg/dL — AB (ref 70–99)
Glucose-Capillary: 166 mg/dL — ABNORMAL HIGH (ref 70–99)
Glucose-Capillary: 276 mg/dL — ABNORMAL HIGH (ref 70–99)
Glucose-Capillary: 196 mg/dL — ABNORMAL HIGH (ref 70–99)

## 2019-02-24 LAB — URINALYSIS, ROUTINE W REFLEX MICROSCOPIC
Bilirubin Urine: NEGATIVE
Glucose, UA: 50 mg/dL — AB
KETONES UR: NEGATIVE mg/dL
Nitrite: NEGATIVE
Protein, ur: NEGATIVE mg/dL
Specific Gravity, Urine: 1.005 (ref 1.005–1.030)
WBC, UA: >50 WBC/hpf — ABNORMAL HIGH (ref 0–5)
pH: 7 (ref 5.0–8.0)

## 2019-02-24 MED ORDER — FLUCONAZOLE 100 MG PO TABS: 200.0000 mg | ORAL_TABLET | Freq: Once | ORAL | Status: DC

## 2019-02-24 MED ORDER — INSULIN GLARGINE 100 UNIT/ML ~~LOC~~ SOLN
10.0000 [IU] | Freq: Every day | SUBCUTANEOUS | Status: DC
  Administered 2019-02-24: 10 [IU] via SUBCUTANEOUS
  Filled 2019-02-24 (×2): qty 0.1

## 2019-02-24 NOTE — Progress Notes (Signed)
Inpatient Diabetes Program Recommendations  AACE/ADA: New Consensus Statement on Inpatient Glycemic Control (2015)  Target Ranges:  Prepandial:   less than 140 mg/dL      Peak postprandial:   less than 180 mg/dL (1-2 hours)      Critically ill patients:  140 - 180 mg/dL   Lab Results  Component Value Date   GLUCAP 166 (H) 02/24/2019   HGBA1C 8.5 (H) 02/18/2019    Review of Glycemic Control Results for [name] [name], [name] [name] (MRN [id number]) as of 02/24/2019 09:55  Ref. Range 02/22/2019 22:51 02/22/2019 23:45 02/23/2019 17:01 02/23/2019 22:11 02/24/2019 07:34  Glucose-Capillary Latest Ref Range: 70 - 99 mg/dL 84 828 (H) 003 (H) 491 (H) 166 (H)   Diabetes history: DM2 Outpatient Diabetes medications: 70/30 insulin mix 45 units bid + Metformin 500 mg bid Current orders for Inpatient glycemic control: Novolog moderate correction tid + hs  Inpatient Diabetes Program Recommendations:   -Add Lantus 10 units daily (0.1 units/kg x 98.6 kg) -Decrease Novolog correction to sensitive correction  Thank you, Darel Hong E. Angelina Venard, RN, MSN, CDE  Diabetes Coordinator Inpatient Glycemic Control Team Team Pager [phone] (8am-5pm) 02/24/2019 9:56 AM

## 2019-02-24 NOTE — Clinical Social Work Note (Signed)
LCSW following. Pt discussed in Progression today. PT recommending SNF rehab again. Discussed with pt and family and referrals started. Will follow and assist with dc planning.

## 2019-02-24 NOTE — Progress Notes (Signed)
Patient is refusing the use of CPAP at this time. Informed patient to call if she changes her mind

## 2019-02-24 NOTE — NC FL2 (Signed)
[place] MEDICAID FL2 LEVEL OF CARE SCREENING TOOL     IDENTIFICATION  Patient Name: [name] [name] Birthdate: [birth year] Sex: female Admission Date (Current Location): 02/22/2019  [facility] and [place] Number:  [employer] and Address:  [facility],  [address], [place] [address]      Provider Number: [id number]  Attending Physician Name and Address:  Shon Hale, MD  Relative Name and Phone Number:       Current Level of Care: Hospital Recommended Level of Care: Skilled Nursing Facility Prior Approval Number:    Date Approved/Denied:   PASRR Number: [id number]  Discharge Plan: SNF    Current Diagnoses: Patient Active Problem List   Diagnosis Date Noted  . Hypoglycemia 02/22/2019  . Generalized weakness 02/12/2019  . Fall at home 02/12/2019  . Symptomatic Yeast UTI 02/12/2019  . Leukocytosis 02/12/2019  . AKI (acute kidney injury) (HCC) 02/12/2019  . COPD with acute exacerbation (HCC) 12/13/2018  . CVA (cerebral vascular accident) (HCC) 11/01/2018  . Syncope   . Loss of consciousness (HCC) 06/10/2018  . Hyperglycemia 06/10/2018  . Bradycardia, sinus 06/10/2018  . Diastolic dysfunction 09/29/2017  . CAD (coronary artery disease) 09/28/2017  . Acute lower UTI 09/28/2017  . Ischemic stroke (HCC) 05/10/2017  . Acute ischemic stroke (HCC) 05/10/2017  . Stroke (HCC) 08/01/2016  . Dysarthria 03/18/2016  . Anemia of chronic disease 11/19/2015  . OSA (obstructive sleep apnea) 11/19/2015  . Left facial numbness 08/29/2015  . Gait instability 06/26/2015  . COPD exacerbation (HCC) 11/13/2014  . Weakness generalized 12/10/2013  . TIA (transient ischemic attack) 06/22/2012  . Left-sided headache 06/22/2012  . Noncompliance 06/22/2012  . Noncompliance with CPAP treatment 06/22/2012  . Psoriasis 01/07/2012  . Cerebrovascular disease   . Chronic back pain   . Chronic obstructive pulmonary disease (HCC)   . DM type 2 causing  complication (HCC) 02/14/2010  . Obesity 02/14/2010  . Arteriosclerotic cardiovascular disease (ASCVD) 02/14/2010  . Hypothyroidism 02/06/2010  . Hyperlipidemia 02/06/2010  . Essential hypertension 02/06/2010  . Sleep apnea 02/06/2010    Orientation RESPIRATION BLADDER Height & Weight     Self, Time, Situation, Place  Normal Continent Weight: 217 lb 6 oz (98.6 kg) Height:  5\' 4"  (162.6 cm)  BEHAVIORAL SYMPTOMS/MOOD NEUROLOGICAL BOWEL NUTRITION STATUS      Continent    AMBULATORY STATUS COMMUNICATION OF NEEDS Skin   Extensive Assist Verbally Normal                       Personal Care Assistance Level of Assistance    Bathing Assistance: Limited assistance Feeding assistance: Independent Dressing Assistance: Limited assistance     Functional Limitations Info    Sight Info: Adequate Hearing Info: Adequate Speech Info: Adequate    SPECIAL CARE FACTORS FREQUENCY  PT (By licensed PT)     PT Frequency: 5x week              Contractures Contractures Info: Not present    Additional Factors Info  Psychotropic Code Status Info: full Allergies Info: Codeine Psychotropic Info: Celexa, Ativan         Current Medications (02/24/2019):  This is the current hospital active medication list Current Facility-Administered Medications  Medication Dose Route Frequency Provider Last Rate Last Dose  . acetaminophen (TYLENOL) tablet 650 mg  650 mg Oral Q6H PRN Opyd, Lavone Neri, MD       Or  . acetaminophen (TYLENOL) suppository 650 mg  650  mg Rectal Q6H PRN Opyd, Lavone Neri, MD      . amLODipine (NORVASC) tablet 5 mg  5 mg Oral Daily Opyd, Lavone Neri, MD   5 mg at 02/24/19 1001  . aspirin EC tablet 81 mg  81 mg Oral Daily Opyd, Lavone Neri, MD   81 mg at 02/24/19 1001  . benzonatate (TESSALON) capsule 200 mg  200 mg Oral TID PRN Opyd, Lavone Neri, MD      . citalopram (CELEXA) tablet 20 mg  20 mg Oral Daily Opyd, Lavone Neri, MD   20 mg at 02/24/19 1001  . clopidogrel (PLAVIX) tablet  75 mg  75 mg Oral Daily Opyd, Lavone Neri, MD   75 mg at 02/24/19 1001  . enoxaparin (LOVENOX) injection 40 mg  40 mg Subcutaneous Q24H Opyd, Lavone Neri, MD   40 mg at 02/24/19 0559  . fluconazole (DIFLUCAN) tablet 200 mg  200 mg Oral Daily Opyd, Lavone Neri, MD   200 mg at 02/24/19 1001  . gabapentin (NEURONTIN) capsule 300 mg  300 mg Oral BID Opyd, Lavone Neri, MD   300 mg at 02/24/19 1001  . insulin aspart (novoLOG) injection 0-15 Units  0-15 Units Subcutaneous TID WC Opyd, Lavone Neri, MD   3 Units at 02/24/19 0746  . insulin aspart (novoLOG) injection 0-5 Units  0-5 Units Subcutaneous QHS Opyd, Lavone Neri, MD   5 Units at 02/23/19 2310  . ipratropium-albuterol (DUONEB) 0.5-2.5 (3) MG/3ML nebulizer solution 3 mL  3 mL Inhalation QID PRN Opyd, Lavone Neri, MD      . levothyroxine (SYNTHROID, LEVOTHROID) tablet 25 mcg  25 mcg Oral Q0600 Opyd, Lavone Neri, MD   25 mcg at 02/24/19 0600  . lisinopril (PRINIVIL,ZESTRIL) tablet 10 mg  10 mg Oral Daily Opyd, Lavone Neri, MD   10 mg at 02/24/19 1001  . LORazepam (ATIVAN) tablet 1 mg  1 mg Oral QHS Opyd, Lavone Neri, MD   1 mg at 02/23/19 2310  . ondansetron (ZOFRAN) tablet 4 mg  4 mg Oral Q6H PRN Opyd, Lavone Neri, MD       Or  . ondansetron (ZOFRAN) injection 4 mg  4 mg Intravenous Q6H PRN Opyd, Lavone Neri, MD      . oxyCODONE (Oxy IR/ROXICODONE) immediate release tablet 5 mg  5 mg Oral Q4H PRN Opyd, Lavone Neri, MD   5 mg at 02/24/19 0801  . pantoprazole (PROTONIX) EC tablet 40 mg  40 mg Oral Daily Opyd, Lavone Neri, MD   40 mg at 02/24/19 1001  . polyethylene glycol (MIRALAX / GLYCOLAX) packet 17 g  17 g Oral Daily PRN Opyd, Lavone Neri, MD   17 g at 02/24/19 1011  . simvastatin (ZOCOR) tablet 20 mg  20 mg Oral q1800 Opyd, Lavone Neri, MD   20 mg at 02/23/19 1724     Discharge Medications: Please see discharge summary for a list of discharge medications.  Relevant Imaging Results:  Relevant Lab Results:   Additional Information SSN: [id number] [address]  LCSW

## 2019-02-24 NOTE — Progress Notes (Signed)
Patient Demographics:    [name] [name], is a 83 y.o. female, DOB - 07/17/1936, ZOX:[id number]  Admit date - 02/22/2019   Admitting Physician Briscoe Deutscherimothy S Opyd, MD  Outpatient Primary MD for the patient is Oval Linseyondiego, Richard, MD  LOS - 0   Chief Complaint  Patient presents with  . Fall        Subjective:    [name] [name] today has no fevers, no emesis,  No chest pain, complains of dysuria,  Assessment  & Plan :    Principal Problem:   Hypoglycemia Active Problems:   Hypothyroidism   DM type 2 causing complication (HCC)   Essential hypertension   Chronic back pain   Chronic obstructive pulmonary disease (HCC)   OSA (obstructive sleep apnea)   CAD (coronary artery disease)   Generalized weakness   Symptomatic Yeast UTI   Brief Narrative:  Per HPI:  [name] J McMillionis a 82 y.o.femalewith medical history significant forhypertension, insulin-dependent diabetes mellitus, history of CVA, chronic back pain, generalized weakness and debility, hypothyroidism, COPD, and OSA, now presenting to the emergency department after a fall at home. The patient had just been discharged from the hospital several hours earlier after admission for weakness and debility. 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 Family was assisting her with ambulation this evening when she began to fall. She was eased to the ground, did not hit her head, and denies any injury from this. She denies any acute weakness, or any change in her condition since leaving the hospital, but reports that she is too weak to get up on her own and family was not strong enough to help her up either. They called EMS and she was  brought back to the ED. Patient denies fevers, chills, headache, acute numbness or weakness, chest pain, palpitations, cough, or any change in her chronic exertional dyspnea. Dysuria has improved and she denies flank pain.  Patient was readmitted with persistent generalized weakness with inability of family to care for patient at home.  She is also noted to have hypoglycemia in the setting of insulin-dependent diabetes.  She is on ongoing treatment for Candida UTI.   Plan:- 1.Hypoglycemia-resolved; insulin-dependent DM -Presents after a recurrent fall at home and found to have serum glucose of 54 in ED---suspect due to poor oral intake, --A1c was 8.5% last month and she is managed at home with insulin NPH 45 units BID Continue sliding scale, may increase insulin dosage as oral intake improves-  2.Generalized weakness/debility/Falls-- -SNF recommended by PT during recent admission, insurance would not cover, and [facility], but returns with ongoing weakness and debility and yet another fall at home with hypoglycemia possibly contributing---per family no significant head injury --Family at bedside does not want to take her home again, feeling that they are not equipped to help her d/t being physically unable to get her up when she is too weak to stand on her own -[facility]  3.COPD; OSA -No cough, wheeze, or SOB on  admission -Continue Spiriva, as-needed Duonebs, and CPAP qHS  4.CAD -No anginal complaints -Continue DAPT, statin, and ACE  5.Hypertension -BP at goal -Continue lisinopril and Norvasc as tolerated  6.Chronic back pain -No pain complaints on admission -Continue home regimen with Neurontin and as-needed oxycodone  7.Hypothyroidism -TSH wnl two wks ago -Continue Synthroid  8.Yeast UTI -Continue Fluconazoleuntil 3/6 -Continues to have some dysuria----get  catheterized UA specimen to rule out concomitant bacterial UTI   DVT prophylaxis: Lovenox Code Status: Full Family Communication: None at bedside at this time Disposition Plan: Clinical social workers working on placement to SNF.   Consultants:   None  Procedures:   None  Antimicrobials:   Fluconazole for fungal UTI.  Will complete treatment on 02/26/2019  Disposition/Need for in-Hospital Stay- patient unable to be discharged at this time due to awaiting insurance approval for transfer to SNF rehab   Lab Results  Component Value Date   PLT 321 02/23/2019    Inpatient Medications  Scheduled Meds: . amLODipine  5 mg Oral Daily  . aspirin EC  81 mg Oral Daily  . citalopram  20 mg Oral Daily  . clopidogrel  75 mg Oral Daily  . enoxaparin (LOVENOX) injection  40 mg Subcutaneous Q24H  . fluconazole  200 mg Oral Daily  . gabapentin  300 mg Oral BID  . insulin aspart  0-15 Units Subcutaneous TID WC  . insulin aspart  0-5 Units Subcutaneous QHS  . levothyroxine  25 mcg Oral Q0600  . lisinopril  10 mg Oral Daily  . LORazepam  1 mg Oral QHS  . pantoprazole  40 mg Oral Daily  . simvastatin  20 mg Oral q1800   Continuous Infusions: PRN Meds:.acetaminophen **OR** acetaminophen, benzonatate, ipratropium-albuterol, ondansetron **OR** ondansetron (ZOFRAN) IV, oxyCODONE, polyethylene glycol    Anti-infectives (From admission, onward)   Start     Dose/Rate Route Frequency Ordered Stop   02/24/19 1530  fluconazole (DIFLUCAN) tablet 200 mg  Status:  Discontinued     200 mg Oral  Once 02/24/19 1524 02/24/19 1524   02/23/19 1000  fluconazole (DIFLUCAN) tablet 200 mg     200 mg Oral Daily 02/22/19 2304          Objective:   Vitals:   02/23/19 1422 02/23/19 2214 02/24/19 0615 02/24/19 1424  BP: (!) 160/64 (!) 148/62 (!) 152/69 139/64  Pulse: (!) 55 (!) 59 60 64  Resp: 16 18 20 16   Temp:  98.7 F (37.1 C) 98.1 F (36.7 C) 98.1 F (36.7 C)  TempSrc:  Oral Oral Oral    SpO2: 100% 97% 95% 95%  Weight:      Height:        Wt Readings from Last 3 Encounters:  02/22/19 98.6 kg  02/15/19 91.6 kg  02/08/19 86.2 kg     Intake/Output Summary (Last 24 hours) at 02/24/2019 1711 Last data filed at 02/24/2019 1500 Gross per 24 hour  Intake 360 ml  Output 3800 ml  Net -3440 ml     Physical Exam Patient is examined daily including today on 02/24/19 , exams remain the same as of yesterday except that has changed   Gen:- Awake Alert,  In no apparent distress  HEENT:- [place].AT, No sclera icterus Neck-Supple Neck,No JVD,.  Lungs-  CTAB , fair symmetrical air movement CV- S1, S2 normal, regular  Abd-  +ve B.Sounds, Abd Soft, No tenderness,    Extremity/Skin:- No  edema, pedal pulses present  Psych-affect is appropriate, oriented x3 Neuro-no new  focal deficits, no tremors   Data Review:   Micro Results No results found for this or any previous visit (from the past 240 hour(s)).  Radiology Reports Dg Chest 2 View  Result Date: 02/08/2019 CLINICAL DATA:  Headache and dizziness. EXAM: CHEST - 2 VIEW COMPARISON:  01/13/2019 FINDINGS: The cardiac silhouette, mediastinal and hilar contours are within normal limits and stable. There are mild chronic bronchitic type lung changes with streaky areas of scarring change. No acute infiltrates, edema or effusions. The bony thorax is intact. IMPRESSION: Chronic lung changes but no acute pulmonary findings. Electronically Signed   By: Rudie Meyer M.D.   On: 02/08/2019 19:53   Ct Head Wo Contrast  Result Date: 02/12/2019 CLINICAL DATA:  Ataxia with stroke suspected EXAM: CT HEAD WITHOUT CONTRAST TECHNIQUE: Contiguous axial images were obtained from the base of the skull through the vertex without intravenous contrast. COMPARISON:  02/08/2019 FINDINGS: Brain: No evidence of acute infarction, hemorrhage, hydrocephalus, extra-axial collection or mass lesion/mass effect. Mild for age cerebral volume loss. Chronic small vessel  ischemia in the white matter. Vascular: No hyperdense vessel or unexpected calcification. Skull: Normal. Negative for fracture or focal lesion. Sinuses/Orbits: No acute finding. IMPRESSION: Senescent changes without acute finding. Electronically Signed   By: Marnee Spring M.D.   On: 02/12/2019 09:02   Ct Head Wo Contrast  Result Date: 02/08/2019 CLINICAL DATA:  83 year old female with a history of dizziness EXAM: CT HEAD WITHOUT CONTRAST TECHNIQUE: Contiguous axial images were obtained from the base of the skull through the vertex without intravenous contrast. COMPARISON:  MR 02/08/2019, CT 01/03/2019 FINDINGS: Brain: No acute intracranial hemorrhage. No midline shift or mass effect. Gray-white differentiation maintained. Unremarkable configuration of the ventricles. Confluent hypodensity in the bilateral periventricular white matter. Vascular: Intracranial atherosclerosis of anterior circulation Skull: No acute displaced fracture.  No aggressive bony lesions. Sinuses/Orbits: No acute finding. Other: None IMPRESSION: Negative for acute intracranial abnormality. Evidence of chronic microvascular ischemic disease Electronically Signed   By: Gilmer Mor D.O.   On: 02/08/2019 19:48   [name] Contrast  Result Date: 02/17/2019 CLINICAL DATA:  Left-sided weakness and fall. EXAM: MRI HEAD WITHOUT CONTRAST MRA HEAD WITHOUT CONTRAST TECHNIQUE: Multiplanar, multiecho pulse sequences of the brain and surrounding structures were obtained without intravenous contrast. Angiographic images of the head were obtained using MRA technique without contrast. COMPARISON:  MRA brain 11/02/2018 Head CT 02/17/2019 Brain MRI 02/08/2019 FINDINGS: MRI HEAD FINDINGS BRAIN: There is no acute infarct, acute hemorrhage or mass effect. The midline structures are normal. Old right thalamic lacunar infarct. Early confluent hyperintense T2-weighted signal of the periventricular and deep white matter, most commonly due to chronic  ischemic microangiopathy. Generalized atrophy without lobar predilection. Susceptibility-sensitive sequences show no chronic microhemorrhage or superficial siderosis. SKULL AND UPPER CERVICAL SPINE: The visualized skull base, calvarium, upper cervical spine and extracranial soft tissues are normal. SINUSES/ORBITS: No fluid levels or advanced mucosal thickening. No mastoid or middle ear effusion. The orbits are normal. MRA HEAD FINDINGS POSTERIOR CIRCULATION: --Basilar artery: Normal. --Posterior cerebral arteries: Multifocal severe stenosis of both P2 segments. The right PCA is predominantly supplied by the posterior communicating artery. --Superior cerebellar arteries: Normal. --Inferior cerebellar arteries: Normal anterior and posterior inferior cerebellar arteries. ANTERIOR CIRCULATION: --Intracranial internal carotid arteries: Normal. --Anterior cerebral arteries: Normal. Hypoplastic right A1 segment, normal variant. --Middle cerebral arteries: There are multiple moderate-to-severe stenoses of the proximal right M2 branches. There is severe stenosis of the distal left M1 and proximal M2 branches. The left  M1 stenosis is new compared to 11/02/18. --Posterior communicating arteries: Right p-comm gives rise to the right PCA. No left p-comm is visualized. IMPRESSION: 1. No acute ischemic infarct. 2. Chronic ischemic microangiopathy and generalized atrophy. 3. New, severe stenosis of the distal left M1 MCA segment. Unchanged bilateral MCA M2 range moderate-to-severe stenoses. 4. Unchanged multifocal severe stenoses of both posterior cerebral artery P2 segments. Electronically Signed   By: Deatra Robinson M.D.   On: 02/17/2019 15:38   [name] Contrast (neuro Protocol)  Result Date: 02/08/2019 CLINICAL DATA:  Ataxia, vertigo EXAM: MRI HEAD WITHOUT CONTRAST TECHNIQUE: Multiplanar, multiecho pulse sequences of the brain and surrounding structures were obtained without intravenous contrast. COMPARISON:  Brain MRI  11/02/2018 FINDINGS: BRAIN: There is no acute infarct, acute hemorrhage, hydrocephalus or extra-axial collection. The midline structures are normal. No midline shift or other mass effect. Diffuse confluent hyperintense T2-weighted signal within the periventricular, deep and juxtacortical white matter, most commonly due to chronic ischemic microangiopathy. Generalized atrophy without lobar predilection. Susceptibility-sensitive sequences show no chronic microhemorrhage or superficial siderosis. Small, old left cerebellar infarct and bilateral thalamic infarcts. VASCULAR: Major intracranial arterial and venous sinus flow voids are normal. SKULL AND UPPER CERVICAL SPINE: Calvarial bone marrow signal is normal. There is no skull base mass. Visualized upper cervical spine and soft tissues are normal. SINUSES/ORBITS: No fluid levels or advanced mucosal thickening. No mastoid or middle ear effusion. The orbits are normal. IMPRESSION: Chronic ischemic microangiopathy and generalized atrophy without acute intracranial abnormality. Electronically Signed   By: Deatra Robinson M.D.   On: 02/08/2019 18:36   Mr Cervical Spine Wo Contrast  Result Date: 02/18/2019 CLINICAL DATA:  Back pain and left lower extremity weakness over the last week. EXAM: MRI CERVICAL, THORACIC AND LUMBAR SPINE WITHOUT AND WITH CONTRAST TECHNIQUE: Multiplanar and multiecho pulse sequences of the cervical spine, to include the craniocervical junction and cervicothoracic junction, and thoracic and lumbar spine, were obtained without and with intravenous contrast. COMPARISON:  CT 02/12/2019.  Lumbar MRI 12/21/2018 CONTRAST:  10 cc Gadavist FINDINGS: MRI CERVICAL SPINE FINDINGS Alignment: Normal Vertebrae: No fracture or primary bone lesion. Cord: No cord compression or primary cord lesion. Posterior Fossa, vertebral arteries, paraspinal tissues: Negative Disc levels: No abnormality at C1-2, C2-3 or C3-4. C4-5: Right paracentral disc herniation with slight  upward migration indents the ventral subarachnoid space but does not compress the cord or show foraminal extension. C5-6: Endplate osteophytes and bulging of the disc more prominent towards the right. No compressive canal stenosis. Right foraminal narrowing of a moderate degree. C6-7: Bulging of the disc and bilateral uncovertebral prominence. No compressive central canal stenosis. Mild bilateral foraminal narrowing. C7-T1: Bulging of the disc.  No canal or foraminal stenosis. MRI THORACIC SPINE FINDINGS Alignment:  No significant malalignment. Vertebrae: No fracture or primary bone lesion. Cord:  No primary cord lesion.  See below regarding stenosis. Paraspinal and other soft tissues: Negative Disc levels: T2-3: Mild noncompressive disc bulge. T4-5: Mild noncompressive disc bulge. T5-6: Shallow left posterolateral herniation without visible neural compression T6-7: Shallow central disc herniation without visible neural compression. T7-8: Central to left-sided disc herniation, contacting the left side of the cord. This could possibly be symptomatic. T8-9: Mild noncompressive disc bulge. T9-10: Mild noncompressive disc bulge. T10-11 through T12-L1: Mild noncompressive disc bulges. MRI LUMBAR SPINE FINDINGS Segmentation:  5 lumbar type vertebral bodies. Alignment: Straightening of the normal lumbar lordosis. Mild curvature convex to the left. Vertebrae: No fracture or primary bone lesion. Discogenic endplate marrow changes  on the right at L3-4 and L4-5. Conus medullaris and cauda equina: Conus extends to the L1 level. Conus and cauda equina appear normal. Paraspinal and other soft tissues: Negative Disc levels: L1-2: Previous left hemilaminectomy. Broad-based disc herniation more prominent in the left posterolateral direction. This indents the thecal sac but does not cause visible neural compression. L2-3: Previous left hemilaminectomy. Endplate osteophytes and bulging of the disc. No apparent compressive stenosis.  Foraminal to extraforaminal encroachment on the right by osteophyte in disc does not appear to compress the L2 nerve, but could irritate that structure. L3-4: Endplate osteophytes and bulging of the disc. Mild facet and ligamentous hypertrophy. Mild stenosis of the lateral recesses and foramina but without definite neural compression. L4-5: Endplate osteophytes and broad-based protrusion of the disc. Mild facet and ligamentous hypertrophy. Stenosis of the lateral recesses and neural foramina that could cause neural compression on either or both sides. L5-S1: Mild bulging of the disc.  No canal or foraminal stenosis. IMPRESSION: Cervical region: No cord compression or primary cord lesion. Right paracentral disc herniation at C4-5 with slight upward migration, not apparently resulting in neural compression. Spondylosis at C5-6 and C6-7 with moderate right foraminal narrowing at C5-6 and mild bilateral foraminal narrowing at C6-7. Thoracic region: No fracture. No primary cord lesion. Degenerative spondylosis at multiple levels without apparent neural compression. Left posterolateral disc herniation at T7-8 which does contact the left side of the cord and could be symptomatic. Ample subarachnoid spaces present dorsal to cord however. Lumbar region: No change appreciated since December. Previous left-sided decompression at L1-2 and L2-3. No ongoing left-sided neural compression visible at those levels. L3-4: Mild stenosis of the lateral recesses and foramina but without definite neural compression. L4-5: Bilateral lateral recess and foraminal stenosis because of encroachment by osteophyte in disc material. Neural compression could occur on either side at this level. Electronically Signed   By: Paulina Fusi M.D.   On: 02/18/2019 09:44   Mr Thoracic Spine Wo Contrast  Result Date: 02/18/2019 CLINICAL DATA:  Back pain and left lower extremity weakness over the last week. EXAM: MRI CERVICAL, THORACIC AND LUMBAR SPINE  WITHOUT AND WITH CONTRAST TECHNIQUE: Multiplanar and multiecho pulse sequences of the cervical spine, to include the craniocervical junction and cervicothoracic junction, and thoracic and lumbar spine, were obtained without and with intravenous contrast. COMPARISON:  CT 02/12/2019.  Lumbar MRI 12/21/2018 CONTRAST:  10 cc Gadavist FINDINGS: MRI CERVICAL SPINE FINDINGS Alignment: Normal Vertebrae: No fracture or primary bone lesion. Cord: No cord compression or primary cord lesion. Posterior Fossa, vertebral arteries, paraspinal tissues: Negative Disc levels: No abnormality at C1-2, C2-3 or C3-4. C4-5: Right paracentral disc herniation with slight upward migration indents the ventral subarachnoid space but does not compress the cord or show foraminal extension. C5-6: Endplate osteophytes and bulging of the disc more prominent towards the right. No compressive canal stenosis. Right foraminal narrowing of a moderate degree. C6-7: Bulging of the disc and bilateral uncovertebral prominence. No compressive central canal stenosis. Mild bilateral foraminal narrowing. C7-T1: Bulging of the disc.  No canal or foraminal stenosis. MRI THORACIC SPINE FINDINGS Alignment:  No significant malalignment. Vertebrae: No fracture or primary bone lesion. Cord:  No primary cord lesion.  See below regarding stenosis. Paraspinal and other soft tissues: Negative Disc levels: T2-3: Mild noncompressive disc bulge. T4-5: Mild noncompressive disc bulge. T5-6: Shallow left posterolateral herniation without visible neural compression T6-7: Shallow central disc herniation without visible neural compression. T7-8: Central to left-sided disc herniation, contacting the left  side of the cord. This could possibly be symptomatic. T8-9: Mild noncompressive disc bulge. T9-10: Mild noncompressive disc bulge. T10-11 through T12-L1: Mild noncompressive disc bulges. MRI LUMBAR SPINE FINDINGS Segmentation:  5 lumbar type vertebral bodies. Alignment:  Straightening of the normal lumbar lordosis. Mild curvature convex to the left. Vertebrae: No fracture or primary bone lesion. Discogenic endplate marrow changes on the right at L3-4 and L4-5. Conus medullaris and cauda equina: Conus extends to the L1 level. Conus and cauda equina appear normal. Paraspinal and other soft tissues: Negative Disc levels: L1-2: Previous left hemilaminectomy. Broad-based disc herniation more prominent in the left posterolateral direction. This indents the thecal sac but does not cause visible neural compression. L2-3: Previous left hemilaminectomy. Endplate osteophytes and bulging of the disc. No apparent compressive stenosis. Foraminal to extraforaminal encroachment on the right by osteophyte in disc does not appear to compress the L2 nerve, but could irritate that structure. L3-4: Endplate osteophytes and bulging of the disc. Mild facet and ligamentous hypertrophy. Mild stenosis of the lateral recesses and foramina but without definite neural compression. L4-5: Endplate osteophytes and broad-based protrusion of the disc. Mild facet and ligamentous hypertrophy. Stenosis of the lateral recesses and neural foramina that could cause neural compression on either or both sides. L5-S1: Mild bulging of the disc.  No canal or foraminal stenosis. IMPRESSION: Cervical region: No cord compression or primary cord lesion. Right paracentral disc herniation at C4-5 with slight upward migration, not apparently resulting in neural compression. Spondylosis at C5-6 and C6-7 with moderate right foraminal narrowing at C5-6 and mild bilateral foraminal narrowing at C6-7. Thoracic region: No fracture. No primary cord lesion. Degenerative spondylosis at multiple levels without apparent neural compression. Left posterolateral disc herniation at T7-8 which does contact the left side of the cord and could be symptomatic. Ample subarachnoid spaces present dorsal to cord however. Lumbar region: No change  appreciated since December. Previous left-sided decompression at L1-2 and L2-3. No ongoing left-sided neural compression visible at those levels. L3-4: Mild stenosis of the lateral recesses and foramina but without definite neural compression. L4-5: Bilateral lateral recess and foraminal stenosis because of encroachment by osteophyte in disc material. Neural compression could occur on either side at this level. Electronically Signed   By: Paulina Fusi M.D.   On: 02/18/2019 09:44   Mr Lumbar Spine W Wo Contrast  Result Date: 02/18/2019 CLINICAL DATA:  Back pain and left lower extremity weakness over the last week. EXAM: MRI CERVICAL, THORACIC AND LUMBAR SPINE WITHOUT AND WITH CONTRAST TECHNIQUE: Multiplanar and multiecho pulse sequences of the cervical spine, to include the craniocervical junction and cervicothoracic junction, and thoracic and lumbar spine, were obtained without and with intravenous contrast. COMPARISON:  CT 02/12/2019.  Lumbar MRI 12/21/2018 CONTRAST:  10 cc Gadavist FINDINGS: MRI CERVICAL SPINE FINDINGS Alignment: Normal Vertebrae: No fracture or primary bone lesion. Cord: No cord compression or primary cord lesion. Posterior Fossa, vertebral arteries, paraspinal tissues: Negative Disc levels: No abnormality at C1-2, C2-3 or C3-4. C4-5: Right paracentral disc herniation with slight upward migration indents the ventral subarachnoid space but does not compress the cord or show foraminal extension. C5-6: Endplate osteophytes and bulging of the disc more prominent towards the right. No compressive canal stenosis. Right foraminal narrowing of a moderate degree. C6-7: Bulging of the disc and bilateral uncovertebral prominence. No compressive central canal stenosis. Mild bilateral foraminal narrowing. C7-T1: Bulging of the disc.  No canal or foraminal stenosis. MRI THORACIC SPINE FINDINGS Alignment:  No significant malalignment. Vertebrae:  No fracture or primary bone lesion. Cord:  No primary cord  lesion.  See below regarding stenosis. Paraspinal and other soft tissues: Negative Disc levels: T2-3: Mild noncompressive disc bulge. T4-5: Mild noncompressive disc bulge. T5-6: Shallow left posterolateral herniation without visible neural compression T6-7: Shallow central disc herniation without visible neural compression. T7-8: Central to left-sided disc herniation, contacting the left side of the cord. This could possibly be symptomatic. T8-9: Mild noncompressive disc bulge. T9-10: Mild noncompressive disc bulge. T10-11 through T12-L1: Mild noncompressive disc bulges. MRI LUMBAR SPINE FINDINGS Segmentation:  5 lumbar type vertebral bodies. Alignment: Straightening of the normal lumbar lordosis. Mild curvature convex to the left. Vertebrae: No fracture or primary bone lesion. Discogenic endplate marrow changes on the right at L3-4 and L4-5. Conus medullaris and cauda equina: Conus extends to the L1 level. Conus and cauda equina appear normal. Paraspinal and other soft tissues: Negative Disc levels: L1-2: Previous left hemilaminectomy. Broad-based disc herniation more prominent in the left posterolateral direction. This indents the thecal sac but does not cause visible neural compression. L2-3: Previous left hemilaminectomy. Endplate osteophytes and bulging of the disc. No apparent compressive stenosis. Foraminal to extraforaminal encroachment on the right by osteophyte in disc does not appear to compress the L2 nerve, but could irritate that structure. L3-4: Endplate osteophytes and bulging of the disc. Mild facet and ligamentous hypertrophy. Mild stenosis of the lateral recesses and foramina but without definite neural compression. L4-5: Endplate osteophytes and broad-based protrusion of the disc. Mild facet and ligamentous hypertrophy. Stenosis of the lateral recesses and neural foramina that could cause neural compression on either or both sides. L5-S1: Mild bulging of the disc.  No canal or foraminal stenosis.  IMPRESSION: Cervical region: No cord compression or primary cord lesion. Right paracentral disc herniation at C4-5 with slight upward migration, not apparently resulting in neural compression. Spondylosis at C5-6 and C6-7 with moderate right foraminal narrowing at C5-6 and mild bilateral foraminal narrowing at C6-7. Thoracic region: No fracture. No primary cord lesion. Degenerative spondylosis at multiple levels without apparent neural compression. Left posterolateral disc herniation at T7-8 which does contact the left side of the cord and could be symptomatic. Ample subarachnoid spaces present dorsal to cord however. Lumbar region: No change appreciated since December. Previous left-sided decompression at L1-2 and L2-3. No ongoing left-sided neural compression visible at those levels. L3-4: Mild stenosis of the lateral recesses and foramina but without definite neural compression. L4-5: Bilateral lateral recess and foraminal stenosis because of encroachment by osteophyte in disc material. Neural compression could occur on either side at this level. Electronically Signed   By: Paulina Fusi M.D.   On: 02/18/2019 09:44   US Carotid Bilateral (at [facility] Only)  Result Date: 02/17/2019 CLINICAL DATA:  Stroke for 12 hours EXAM: BILATERAL CAROTID DUPLEX ULTRASOUND TECHNIQUE: Wallace Cullens scale imaging, color Doppler and duplex ultrasound were performed of bilateral carotid and vertebral arteries in the neck. COMPARISON:  08/02/2016 FINDINGS: Criteria: Quantification of carotid stenosis is based on velocity parameters that correlate the residual internal carotid diameter with NASCET-based stenosis levels, using the diameter of the distal internal carotid lumen as the denominator for stenosis measurement. The following velocity measurements were obtained: RIGHT ICA: 101 cm/sec CCA: 138 cm/sec SYSTOLIC ICA/CCA RATIO:  0.7 ECA: 120 cm/sec LEFT ICA: 72 cm/sec CCA: 67 cm/sec SYSTOLIC ICA/CCA RATIO:  1.1 ECA: 141 cm/sec RIGHT  CAROTID ARTERY: Mild irregular calcified plaque in the bulb. Low resistance internal carotid Doppler pattern is preserved. RIGHT VERTEBRAL  ARTERY:  Antegrade. LEFT CAROTID ARTERY: Moderate predominately smooth calcified plaque in the bulb. Low resistance internal carotid Doppler pattern is preserved. LEFT VERTEBRAL ARTERY:  Antegrade. IMPRESSION: Less than 50% stenosis in the right and left internal carotid arteries. Antegrade vertebral arteries bilaterally. Electronically Signed   By: Jolaine Click M.D.   On: 02/17/2019 16:01   Dg Chest Portable 1 View  Result Date: 02/12/2019 CLINICAL DATA:  Status post fall this morning getting out of bed. EXAM: PORTABLE CHEST 1 VIEW COMPARISON:  PA and lateral chest 02/08/2019 and 02/28/2018. FINDINGS: The patient is status post CABG. Heart size is upper normal. Mild discoid atelectasis left lung base noted. Lungs otherwise clear. No pneumothorax or pleural effusion. No acute bony abnormality. There is partial visualization of fixation of a right humerus fracture and rotator cuff repair on the right. IMPRESSION: No acute disease. Electronically Signed   By: Drusilla Kanner M.D.   On: 02/12/2019 08:53   [name] Wo Contrast  Result Date: 02/17/2019 CLINICAL DATA:  Left-sided weakness and fall. EXAM: MRI HEAD WITHOUT CONTRAST MRA HEAD WITHOUT CONTRAST TECHNIQUE: Multiplanar, multiecho pulse sequences of the brain and surrounding structures were obtained without intravenous contrast. Angiographic images of the head were obtained using MRA technique without contrast. COMPARISON:  MRA brain 11/02/2018 Head CT 02/17/2019 Brain MRI 02/08/2019 FINDINGS: MRI HEAD FINDINGS BRAIN: There is no acute infarct, acute hemorrhage or mass effect. The midline structures are normal. Old right thalamic lacunar infarct. Early confluent hyperintense T2-weighted signal of the periventricular and deep white matter, most commonly due to chronic ischemic microangiopathy. Generalized atrophy  without lobar predilection. Susceptibility-sensitive sequences show no chronic microhemorrhage or superficial siderosis. SKULL AND UPPER CERVICAL SPINE: The visualized skull base, calvarium, upper cervical spine and extracranial soft tissues are normal. SINUSES/ORBITS: No fluid levels or advanced mucosal thickening. No mastoid or middle ear effusion. The orbits are normal. MRA HEAD FINDINGS POSTERIOR CIRCULATION: --Basilar artery: Normal. --Posterior cerebral arteries: Multifocal severe stenosis of both P2 segments. The right PCA is predominantly supplied by the posterior communicating artery. --Superior cerebellar arteries: Normal. --Inferior cerebellar arteries: Normal anterior and posterior inferior cerebellar arteries. ANTERIOR CIRCULATION: --Intracranial internal carotid arteries: Normal. --Anterior cerebral arteries: Normal. Hypoplastic right A1 segment, normal variant. --Middle cerebral arteries: There are multiple moderate-to-severe stenoses of the proximal right M2 branches. There is severe stenosis of the distal left M1 and proximal M2 branches. The left M1 stenosis is new compared to 11/02/18. --Posterior communicating arteries: Right p-comm gives rise to the right PCA. No left p-comm is visualized. IMPRESSION: 1. No acute ischemic infarct. 2. Chronic ischemic microangiopathy and generalized atrophy. 3. New, severe stenosis of the distal left M1 MCA segment. Unchanged bilateral MCA M2 range moderate-to-severe stenoses. 4. Unchanged multifocal severe stenoses of both posterior cerebral artery P2 segments. Electronically Signed   By: Deatra Robinson M.D.   On: 02/17/2019 15:38   Ct Head Code Stroke Wo Contrast  Result Date: 02/17/2019 CLINICAL DATA:  Code stroke. Fall today. New onset left lower extremity weakness. EXAM: CT HEAD WITHOUT CONTRAST TECHNIQUE: Contiguous axial images were obtained from the base of the skull through the vertex without intravenous contrast. COMPARISON:  CT head without contrast  02/12/2019. MRI brain 02/08/2019 FINDINGS: Brain: Moderate atrophy and white matter disease is stable. Basal ganglia are intact. Insular ribbon is normal bilaterally. No acute or focal cortical abnormalities are present. The ventricles are of proportionate to the degree of atrophy. No significant extraaxial fluid collection is present. The brainstem and cerebellum  are within normal limits. Vascular: Atherosclerotic calcifications are again noted within the cavernous internal carotid arteries. There is no hyperdense vessel. Skull: Calvarium is intact. No focal lytic or blastic lesions are present. Sinuses/Orbits: Chronic right sphenoid sinus disease is present. The paranasal sinuses are otherwise clear. Bilateral lens replacements are noted. Globes and orbits are otherwise unremarkable. ASPECTS [facility] Stroke Program Early CT Score) - Ganglionic level infarction (caudate, lentiform nuclei, internal capsule, insula, M1-M3 cortex): 7/7 - Supraganglionic infarction (M4-M6 cortex): 3/3 Total score (0-10 with 10 being normal): 10/10 IMPRESSION: 1. No acute intracranial abnormality or significant interval change. 2. Stable moderate atrophy and white matter disease. 3. ASPECTS is 10/10 These results were called by telephone at the time of interpretation on 02/17/2019 at 1:28 pm to Dr. Durward Mallard [facility] , who verbally acknowledged these results. Electronically Signed   By: Marin Roberts M.D.   On: 02/17/2019 13:28     CBC Recent Labs  Lab 02/22/19 2129 02/23/19 1510  WBC 13.7* 14.3*  HGB 12.7 12.2  HCT 43.2 42.0  PLT 369 321  MCV 80.7 80.3  MCH 23.7* 23.3*  MCHC 29.4* 29.0*  RDW 16.7* 16.8*  LYMPHSABS 3.4 3.7  MONOABS 1.6* 1.2*  EOSABS 0.1 0.1  BASOSABS 0.0 0.0    Chemistries  Recent Labs  Lab 02/19/19 0550 02/19/19 1133 02/20/19 0649 02/22/19 2129 02/23/19 1510  NA  --   --  139 137 137  K  --   --  4.0 4.2 3.9  CL  --   --  108 102 102  CO2  --   --  GLUCOSE  --  435* 279*  54* 235*  BUN  --   --  19 28* 21  CREATININE 0.70  --  0.70 0.79 0.70  CALCIUM  --   --  9.0 9.3 8.5*   ------------------------------------------------------------------------------------------------------------------ No results for input(s): CHOL, HDL, LDLCALC, TRIG, CHOLHDL, LDLDIRECT in the last 72 hours.  Lab Results  Component Value Date   HGBA1C 8.5 (H) 02/18/2019   ------------------------------------------------------------------------------------------------------------------ No results for input(s): TSH, T4TOTAL, T3FREE, THYROIDAB in the last 72 hours.  Invalid input(s): FREET3 ------------------------------------------------------------------------------------------------------------------ No results for input(s): VITAMINB12, FOLATE, FERRITIN, TIBC, IRON, RETICCTPCT in the last 72 hours.  Coagulation profile No results for input(s): INR, PROTIME in the last 168 hours.  No results for input(s): DDIMER in the last 72 hours.  Cardiac Enzymes No results for input(s): CKMB, TROPONINI, MYOGLOBIN in the last 168 hours.  Invalid input(s): CK ------------------------------------------------------------------------------------------------------------------    Component Value Date/Time   BNP 475.0 (H) 12/13/2018 1610     Shon Hale M.D on 02/24/2019 at 5:11 PM  Go to [email or web address] - for contact info  [facility] - Office  [phone]

## 2019-02-24 NOTE — Evaluation (Signed)
Physical Therapy Evaluation Patient Details Name: ELINDA MO MRN: [id number] DOB: 04/14/1936 Today's Date: 02/24/2019   History of Present Illness  ZAIRE OTANO is a 83 y.o. female with medical history significant for hypertension, insulin-dependent diabetes mellitus, history of CVA, chronic back pain, generalized weakness and debility, hypothyroidism, COPD, and OSA, now presenting to the emergency department after a fall at home.  The patient had just been discharged from the hospital several hours earlier after admission for weakness and debility.  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  Family was assisting her with ambulation this evening when she began to fall.  She was eased to the ground, did not hit her head, and denies any injury from this.  She denies any acute weakness, or any change in her condition since leaving the hospital, but reports that she is too weak to get up on her own and family was not strong enough to help her up either.  They called EMS and she was brought back to the ED.  Patient denies fevers, chills, headache, acute numbness or weakness, chest pain, palpitations, cough, or any change in her chronic exertional dyspnea.  Dysuria has improved and she     Clinical Impression  Patient requires much assistance and increased time for sitting up at bedside with slow labored movement, limited to a few side steps and severe risk for falls mostly due to LLE weakness with poor standing balance.  Patient able to transfer to [facility], then to chair, had difficulty advancing LLE due to weakness and limited left ankle dorsiflexion.  Patient tolerated sitting up in chair after therapy - nursing staff notified.  Patient will benefit from continued physical therapy in  hospital and recommended venue below to increase strength, balance, endurance for safe ADLs and gait.    Follow Up Recommendations SNF    Equipment Recommendations  None recommended by PT    Recommendations for Other Services       Precautions / Restrictions Precautions Precautions: Fall Restrictions Weight Bearing Restrictions: No      Mobility  Bed Mobility Overal bed mobility: Needs Assistance Bed Mobility: Supine to Sit     Supine to sit: Mod assist     General bed mobility comments: slow labored movement  Transfers Overall transfer level: Needs assistance Equipment used: Rolling walker (2 wheeled) Transfers: Sit to/from [employer] Sit to Stand: Mod assist Stand pivot transfers: Mod assist       General transfer comment: slow labored movement, diffiuclty secondary to LLE weakness  Ambulation/Gait Ambulation/Gait assistance: Max assist Gait Distance (Feet): 3 Feet Assistive device: Rolling walker (2 wheeled) Gait Pattern/deviations: Decreased step length - right;Decreased step length - left;Decreased stance time - left;Decreased stride length Gait velocity: slow   General Gait Details: limited to 3-4 slow unsteady labored steps with diffiuclty advancing LLE due to weakness and lack of dorsiflexion  Stairs            Wheelchair Mobility    Modified Rankin (Stroke Patients Only)       Balance Overall balance assessment: Needs assistance Sitting-balance support: Feet supported;No upper extremity supported Sitting balance-Leahy Scale: Fair     Standing balance support: Bilateral upper extremity supported;During functional activity Standing balance-Leahy Scale: Poor Standing balance comment: fair/poor using RW  Pertinent Vitals/Pain Pain Assessment: Faces Faces Pain Scale: Hurts a little bit Pain Location: chronic low back Pain Descriptors / Indicators: Aching Pain Intervention(s):  Limited activity within patient's tolerance;Monitored during session    Home Living Family/patient expects to be discharged to:: Private residence Living Arrangements: Spouse/significant other;Children Available Help at Discharge: Family Type of Home: House Home Access: Stairs to enter Entrance Stairs-Rails: Right;Left;Can reach both Secretary/administrator of Steps: 2 Home Layout: One level Home Equipment: Shower seat;Cane - single point;Walker - 2 wheels;Bedside commode Additional Comments: presently patient's spouse at SNF, her son admitted in hospital and daughter on disability    Prior Function Level of Independence: Independent with assistive device(s)         Comments: community ambulator with [facility], drives prior to last admission in 2/20, since, assisted transfers, limited to a few steps at bedside     Hand Dominance   Dominant Hand: Right    Extremity/Trunk Assessment   Upper Extremity Assessment Upper Extremity Assessment: Generalized weakness    Lower Extremity Assessment Lower Extremity Assessment: Generalized weakness;RLE deficits/detail;LLE deficits/detail RLE Deficits / Details: grossly -4/5 LLE Deficits / Details: grossly -3/5    Cervical / Trunk Assessment Cervical / Trunk Assessment: Normal  Communication   Communication: No difficulties  Cognition Arousal/Alertness: Awake/alert Behavior During Therapy: WFL for tasks assessed/performed Overall Cognitive Status: Within Functional Limits for tasks assessed                                        General Comments      Exercises     Assessment/Plan    PT Assessment Patient needs continued PT services  PT Problem List Decreased strength;Decreased activity tolerance;Decreased balance;Decreased mobility       PT Treatment Interventions Gait training;Stair training;Functional mobility training;Therapeutic activities;Patient/family education;Therapeutic exercise    PT Goals (Current  goals can be found in the Care Plan section)  Acute Rehab PT Goals Patient Stated Goal: return home with family to assist PT Goal Formulation: With patient Time For Goal Achievement: 03/10/19 Potential to Achieve Goals: Good    Frequency Min 3X/week   Barriers to discharge        Co-evaluation               AM-PAC PT "6 Clicks" Mobility  Outcome Measure Help needed turning from your back to your side while in a flat bed without using bedrails?: A Little Help needed moving from lying on your back to sitting on the side of a flat bed without using bedrails?: A Lot Help needed moving to and from a bed to a chair (including a wheelchair)?: A Lot Help needed standing up from a chair using your arms (e.g., wheelchair or bedside chair)?: A Lot Help needed to walk in hospital room?: A Lot Help needed climbing 3-5 steps with a railing? : Total 6 Click Score: 12    End of Session Equipment Utilized During Treatment: Gait belt Activity Tolerance: Patient tolerated treatment well;Patient limited by fatigue Patient left: in chair;with call bell/phone within reach Nurse Communication: Mobility status PT Visit Diagnosis: Unsteadiness on feet (R26.81);Muscle weakness (generalized) (M62.81);Other abnormalities of gait and mobility (R26.89)    Time: 4076-8088 PT Time Calculation (min) (ACUTE ONLY): 33 min   Charges:   PT Evaluation $PT Eval Moderate Complexity: 1 Mod PT Treatments $Therapeutic Activity: 23-37 mins        12:20 PM, 02/24/19 Fayrene Fearing  Chrissie Noa, MPT Physical Therapist with [facility] 336 [phone] office [phone] mobile phone

## 2019-02-24 NOTE — Plan of Care (Signed)
  Problem: Acute Rehab PT Goals(only PT should resolve) Goal: Pt Will Go Supine/Side To Sit Outcome: Progressing Flowsheets (Taken 02/24/2019 1222) Pt will go Supine/Side to Sit: with minimal assist Goal: Patient Will Transfer Sit To/From Stand Outcome: Progressing Flowsheets (Taken 02/24/2019 1222) Patient will transfer sit to/from stand: with minimal assist Goal: Pt Will Transfer Bed To Chair/Chair To Bed Outcome: Progressing Flowsheets (Taken 02/24/2019 1222) Pt will Transfer Bed to Chair/Chair to Bed: with min assist Goal: Pt Will Ambulate Outcome: Progressing Flowsheets (Taken 02/24/2019 1222) Pt will Ambulate: 15 feet; with moderate assist; with standard walker   12:23 PM, 02/24/19 Ocie Bob, MPT Physical Therapist with [facility] 336 [phone] office [phone] mobile phone

## 2019-02-25 DIAGNOSIS — E162 Hypoglycemia, unspecified: Secondary | ICD-10-CM | POA: Diagnosis not present

## 2019-02-25 LAB — GLUCOSE, CAPILLARY
Glucose-Capillary: 199 mg/dL — ABNORMAL HIGH (ref 70–99)
Glucose-Capillary: 304 mg/dL — ABNORMAL HIGH (ref 70–99)
Glucose-Capillary: 117 mg/dL — ABNORMAL HIGH (ref 70–99)
Glucose-Capillary: 405 mg/dL — ABNORMAL HIGH (ref 70–99)

## 2019-02-25 MED ORDER — INSULIN GLARGINE 100 UNIT/ML ~~LOC~~ SOLN
15.0000 [IU] | Freq: Every day | SUBCUTANEOUS | Status: DC
  Administered 2019-02-25: 15 [IU] via SUBCUTANEOUS
  Filled 2019-02-25: qty 0.15

## 2019-02-25 MED ORDER — SODIUM CHLORIDE 0.9 % IV SOLN
1.0000 g | Freq: Every day | INTRAVENOUS | Status: DC
  Administered 2019-02-25 – 2019-02-26 (×2): 1 g via INTRAVENOUS
  Filled 2019-02-25 (×2): qty 10

## 2019-02-25 MED ORDER — PHENAZOPYRIDINE HCL 100 MG PO TABS
200.0000 mg | ORAL_TABLET | Freq: Three times a day (TID) | ORAL | Status: DC
  Administered 2019-02-25 – 2019-02-26 (×4): 200 mg via ORAL
  Filled 2019-02-25 (×4): qty 2

## 2019-02-25 MED ORDER — INSULIN ASPART 100 UNIT/ML ~~LOC~~ SOLN
6.0000 [IU] | Freq: Once | SUBCUTANEOUS | Status: AC
  Administered 2019-02-25: 6 [IU] via SUBCUTANEOUS

## 2019-02-25 NOTE — Progress Notes (Signed)
Patient Demographics:    [name] [name], is a 83 y.o. female, DOB - 29-May-1936, ZOX:[id number]  Admit date - 02/22/2019   Admitting Physician Briscoe Deutscher, MD  Outpatient Primary MD for the patient is Oval Linsey, MD  LOS - 0   Chief Complaint  Patient presents with  . Fall        Subjective:    [name] [name] today has no fevers, no emesis,  No chest pain, complains of dysuria, no flank pain  Assessment  & Plan :    Principal Problem:   Hypoglycemia Active Problems:   Hypothyroidism   DM type 2 causing complication (HCC)   Essential hypertension   Chronic back pain   Chronic obstructive pulmonary disease (HCC)   OSA (obstructive sleep apnea)   CAD (coronary artery disease)   Generalized weakness   Symptomatic Yeast UTI   Brief Narrative:  Per HPI:  [name] [name] a 82 y.o.femalewith medical history significant forhypertension, insulin-dependent diabetes mellitus, history of CVA, chronic back pain, generalized weakness and debility, hypothyroidism, COPD, and OSA, now presenting to the emergency department after a fall at home. The patient had just been discharged from the hospital several hours earlier after admission for weakness and debility. 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 Family was assisting her with ambulation this evening when she began to fall. She was eased to the ground, did not hit her head, and denies any injury from this. She denies any acute weakness, or any change in her condition since leaving the hospital, but reports that she is too weak to get up on her own and family was not strong enough to help her up either. They called EMS and  she was brought back to the ED. Patient denies fevers, chills, headache, acute numbness or weakness, chest pain, palpitations, cough, or any change in her chronic exertional dyspnea. Dysuria has improved and she denies flank pain.  Patient was readmitted with persistent generalized weakness with inability of family to care for patient at home.  She is also noted to have hypoglycemia in the setting of insulin-dependent diabetes.  She is on ongoing treatment for Candida UTI.   Plan:- 1.Hypoglycemia-resolved; insulin-dependent DM -Presents after a recurrent fall at home and found to have serum glucose of 54 in ED---suspect due to poor oral intake, --A1c was 8.5% last month and she is managed at home with insulin NPH 45 units BID.  No further hypoglycemia,  okay to give Lantus insulin 15 units nightly for now, hold NPH insulin, Continue sliding scale, may increase insulin dosage as oral intake improves-  2.Generalized weakness/debility/Falls-- -SNF recommended by PT during recent admission, insurance would not cover, and she went home with [facility] PT, but returns with ongoing weakness and debility and yet another fall at home with hypoglycemia possibly contributing---per family no significant head injury --Family at bedside does not want to take her home again, feeling that they are not equipped to help her d/t being physically unable to get her up when she is too weak  to stand on her own -PT reevaluated patient again,  PT again recommends SNF rehab for patient safety. social work working on placement to [employer].  3.COPD; OSA -No cough, wheeze, or SOB on admission -Continue Spiriva, as-needed Duonebs, and CPAP qHS  4.CAD -No anginal complaints -Continue DAPT, statin, and ACE  5.Hypertension -Stable, -Continue lisinopril and Norvasc as tolerated  6.Chronic back pain -No pain complaints on admission -Continue home regimen with Neurontin and as-needed  oxycodone  7.Hypothyroidism -TSH wnl two wks ago -Continue Synthroid  8.Yeast UTI -Continue Fluconazoleuntil 3/6 -Continues to have some dysuria----get catheterized UA specimen to rule out concomitant bacterial UTI  9) possible bacterial UTI--- patient with dysuria, UA suspicious for UTI, treat empirically with IV Rocephin started on 02/25/2019 pending urine culture  DVT prophylaxis: Lovenox Code Status: Full Family Communication: None at bedside at this time Disposition Plan: Clinical social workers working on placement to SNF.   Consultants:   None  Procedures:   None  Antimicrobials:   Fluconazole for fungal UTI.  Will complete treatment on 02/26/2019  Rocephine 1 gm q 24 hr -since 02/25/19  Disposition/Need for in-Hospital Stay- patient unable to be discharged at this time due to awaiting insurance approval for transfer to SNF rehab--- -PT reevaluated patient again,  PT again recommends SNF rehab for patient safety.   Lab Results  Component Value Date   PLT 321 02/23/2019    Inpatient Medications  Scheduled Meds: . amLODipine  5 mg Oral Daily  . aspirin EC  81 mg Oral Daily  . citalopram  20 mg Oral Daily  . clopidogrel  75 mg Oral Daily  . enoxaparin (LOVENOX) injection  40 mg Subcutaneous Q24H  . fluconazole  200 mg Oral Daily  . gabapentin  300 mg Oral BID  . insulin aspart  0-15 Units Subcutaneous TID WC  . insulin aspart  0-5 Units Subcutaneous QHS  . insulin glargine  10 Units Subcutaneous QHS  . levothyroxine  25 mcg Oral Q0600  . lisinopril  10 mg Oral Daily  . LORazepam  1 mg Oral QHS  . pantoprazole  40 mg Oral Daily  . phenazopyridine  200 mg Oral TID WC  . simvastatin  20 mg Oral q1800   Continuous Infusions: . cefTRIAXone (ROCEPHIN)  IV 1 g (02/25/19 1157)   PRN Meds:.acetaminophen **OR** acetaminophen, benzonatate, ipratropium-albuterol, ondansetron **OR** ondansetron (ZOFRAN) IV, oxyCODONE, polyethylene  glycol    Anti-infectives (From admission, onward)   Start     Dose/Rate Route Frequency Ordered Stop   02/25/19 1200  cefTRIAXone (ROCEPHIN) 1 g in sodium chloride 0.9 % 100 mL IVPB     1 g 200 mL/hr over 30 Minutes Intravenous Daily 02/25/19 1148 02/28/19 0959   02/24/19 1530  fluconazole (DIFLUCAN) tablet 200 mg  Status:  Discontinued     200 mg Oral  Once 02/24/19 1524 02/24/19 1524   02/23/19 1000  fluconazole (DIFLUCAN) tablet 200 mg     200 mg Oral Daily 02/22/19 2304          Objective:   Vitals:   02/24/19 2133 02/25/19 0500 02/25/19 0536 02/25/19 1322  BP: (!) 151/69  (!) 165/58 136/60  Pulse: 72  (!) 58 72  Resp: 16   16  Temp: 98.2 F (36.8 C)  98.2 F (36.8 C) 98.3 F (36.8 C)  TempSrc: Oral  Oral Oral  SpO2: 95%  95% 96%  Weight:  92.8 kg    Height:        Wt  Readings from Last 3 Encounters:  02/25/19 92.8 kg  02/15/19 91.6 kg  02/08/19 86.2 kg     Intake/Output Summary (Last 24 hours) at 02/25/2019 1608 Last data filed at 02/25/2019 0800 Gross per 24 hour  Intake 360 ml  Output 1400 ml  Net -1040 ml     Physical Exam Patient is examined daily including today on 02/25/19 , exams remain the same as of yesterday except that has changed   Gen:- Awake Alert,  In no apparent distress  HEENT:- [place].AT, No sclera icterus Neck-Supple Neck,No JVD,.  Lungs-  CTAB , fair symmetrical air movement CV- S1, S2 normal, regular  Abd-  +ve B.Sounds, Abd Soft, No tenderness,    Extremity/Skin:- No  edema, pedal pulses present  Psych-affect is appropriate, oriented x3 Neuro-generalized weakness, but no new focal deficits, no tremors   Data Review:   Micro Results No results found for this or any previous visit (from the past 240 hour(s)).  Radiology Reports Dg Chest 2 View  Result Date: 02/08/2019 CLINICAL DATA:  Headache and dizziness. EXAM: CHEST - 2 VIEW COMPARISON:  01/13/2019 FINDINGS: The cardiac silhouette, mediastinal and hilar contours are within  normal limits and stable. There are mild chronic bronchitic type lung changes with streaky areas of scarring change. No acute infiltrates, edema or effusions. The bony thorax is intact. IMPRESSION: Chronic lung changes but no acute pulmonary findings. Electronically Signed   By: Rudie Meyer M.D.   On: 02/08/2019 19:53   Ct Head Wo Contrast  Result Date: 02/12/2019 CLINICAL DATA:  Ataxia with stroke suspected EXAM: CT HEAD WITHOUT CONTRAST TECHNIQUE: Contiguous axial images were obtained from the base of the skull through the vertex without intravenous contrast. COMPARISON:  02/08/2019 FINDINGS: Brain: No evidence of acute infarction, hemorrhage, hydrocephalus, extra-axial collection or mass lesion/mass effect. Mild for age cerebral volume loss. Chronic small vessel ischemia in the white matter. Vascular: No hyperdense vessel or unexpected calcification. Skull: Normal. Negative for fracture or focal lesion. Sinuses/Orbits: No acute finding. IMPRESSION: Senescent changes without acute finding. Electronically Signed   By: Marnee Spring M.D.   On: 02/12/2019 09:02   Ct Head Wo Contrast  Result Date: 02/08/2019 CLINICAL DATA:  83 year old female with a history of dizziness EXAM: CT HEAD WITHOUT CONTRAST TECHNIQUE: Contiguous axial images were obtained from the base of the skull through the vertex without intravenous contrast. COMPARISON:  MR 02/08/2019, CT 01/03/2019 FINDINGS: Brain: No acute intracranial hemorrhage. No midline shift or mass effect. Gray-white differentiation maintained. Unremarkable configuration of the ventricles. Confluent hypodensity in the bilateral periventricular white matter. Vascular: Intracranial atherosclerosis of anterior circulation Skull: No acute displaced fracture.  No aggressive bony lesions. Sinuses/Orbits: No acute finding. Other: None IMPRESSION: Negative for acute intracranial abnormality. Evidence of chronic microvascular ischemic disease Electronically Signed   By:  Gilmer Mor D.O.   On: 02/08/2019 19:48   [name] Contrast  Result Date: 02/17/2019 CLINICAL DATA:  Left-sided weakness and fall. EXAM: MRI HEAD WITHOUT CONTRAST MRA HEAD WITHOUT CONTRAST TECHNIQUE: Multiplanar, multiecho pulse sequences of the brain and surrounding structures were obtained without intravenous contrast. Angiographic images of the head were obtained using MRA technique without contrast. COMPARISON:  MRA brain 11/02/2018 Head CT 02/17/2019 Brain MRI 02/08/2019 FINDINGS: MRI HEAD FINDINGS BRAIN: There is no acute infarct, acute hemorrhage or mass effect. The midline structures are normal. Old right thalamic lacunar infarct. Early confluent hyperintense T2-weighted signal of the periventricular and deep white matter, most commonly due to chronic ischemic microangiopathy.  Generalized atrophy without lobar predilection. Susceptibility-sensitive sequences show no chronic microhemorrhage or superficial siderosis. SKULL AND UPPER CERVICAL SPINE: The visualized skull base, calvarium, upper cervical spine and extracranial soft tissues are normal. SINUSES/ORBITS: No fluid levels or advanced mucosal thickening. No mastoid or middle ear effusion. The orbits are normal. MRA HEAD FINDINGS POSTERIOR CIRCULATION: --Basilar artery: Normal. --Posterior cerebral arteries: Multifocal severe stenosis of both P2 segments. The right PCA is predominantly supplied by the posterior communicating artery. --Superior cerebellar arteries: Normal. --Inferior cerebellar arteries: Normal anterior and posterior inferior cerebellar arteries. ANTERIOR CIRCULATION: --Intracranial internal carotid arteries: Normal. --Anterior cerebral arteries: Normal. Hypoplastic right A1 segment, normal variant. --Middle cerebral arteries: There are multiple moderate-to-severe stenoses of the proximal right M2 branches. There is severe stenosis of the distal left M1 and proximal M2 branches. The left M1 stenosis is new compared to 11/02/18.  --Posterior communicating arteries: Right p-comm gives rise to the right PCA. No left p-comm is visualized. IMPRESSION: 1. No acute ischemic infarct. 2. Chronic ischemic microangiopathy and generalized atrophy. 3. New, severe stenosis of the distal left M1 MCA segment. Unchanged bilateral MCA M2 range moderate-to-severe stenoses. 4. Unchanged multifocal severe stenoses of both posterior cerebral artery P2 segments. Electronically Signed   By: Deatra RobinsonKevin  Herman M.D.   On: 02/17/2019 15:38   [name] Contrast (neuro Protocol)  Result Date: 02/08/2019 CLINICAL DATA:  Ataxia, vertigo EXAM: MRI HEAD WITHOUT CONTRAST TECHNIQUE: Multiplanar, multiecho pulse sequences of the brain and surrounding structures were obtained without intravenous contrast. COMPARISON:  Brain MRI 11/02/2018 FINDINGS: BRAIN: There is no acute infarct, acute hemorrhage, hydrocephalus or extra-axial collection. The midline structures are normal. No midline shift or other mass effect. Diffuse confluent hyperintense T2-weighted signal within the periventricular, deep and juxtacortical white matter, most commonly due to chronic ischemic microangiopathy. Generalized atrophy without lobar predilection. Susceptibility-sensitive sequences show no chronic microhemorrhage or superficial siderosis. Small, old left cerebellar infarct and bilateral thalamic infarcts. VASCULAR: Major intracranial arterial and venous sinus flow voids are normal. SKULL AND UPPER CERVICAL SPINE: Calvarial bone marrow signal is normal. There is no skull base mass. Visualized upper cervical spine and soft tissues are normal. SINUSES/ORBITS: No fluid levels or advanced mucosal thickening. No mastoid or middle ear effusion. The orbits are normal. IMPRESSION: Chronic ischemic microangiopathy and generalized atrophy without acute intracranial abnormality. Electronically Signed   By: Deatra RobinsonKevin  Herman M.D.   On: 02/08/2019 18:36   Mr Cervical Spine Wo Contrast  Result Date:  02/18/2019 CLINICAL DATA:  Back pain and left lower extremity weakness over the last week. EXAM: MRI CERVICAL, THORACIC AND LUMBAR SPINE WITHOUT AND WITH CONTRAST TECHNIQUE: Multiplanar and multiecho pulse sequences of the cervical spine, to include the craniocervical junction and cervicothoracic junction, and thoracic and lumbar spine, were obtained without and with intravenous contrast. COMPARISON:  CT 02/12/2019.  Lumbar MRI 12/21/2018 CONTRAST:  10 cc Gadavist FINDINGS: MRI CERVICAL SPINE FINDINGS Alignment: Normal Vertebrae: No fracture or primary bone lesion. Cord: No cord compression or primary cord lesion. Posterior Fossa, vertebral arteries, paraspinal tissues: Negative Disc levels: No abnormality at C1-2, C2-3 or C3-4. C4-5: Right paracentral disc herniation with slight upward migration indents the ventral subarachnoid space but does not compress the cord or show foraminal extension. C5-6: Endplate osteophytes and bulging of the disc more prominent towards the right. No compressive canal stenosis. Right foraminal narrowing of a moderate degree. C6-7: Bulging of the disc and bilateral uncovertebral prominence. No compressive central canal stenosis. Mild bilateral foraminal narrowing. C7-T1: Bulging of the  disc.  No canal or foraminal stenosis. MRI THORACIC SPINE FINDINGS Alignment:  No significant malalignment. Vertebrae: No fracture or primary bone lesion. Cord:  No primary cord lesion.  See below regarding stenosis. Paraspinal and other soft tissues: Negative Disc levels: T2-3: Mild noncompressive disc bulge. T4-5: Mild noncompressive disc bulge. T5-6: Shallow left posterolateral herniation without visible neural compression T6-7: Shallow central disc herniation without visible neural compression. T7-8: Central to left-sided disc herniation, contacting the left side of the cord. This could possibly be symptomatic. T8-9: Mild noncompressive disc bulge. T9-10: Mild noncompressive disc bulge. T10-11 through  T12-L1: Mild noncompressive disc bulges. MRI LUMBAR SPINE FINDINGS Segmentation:  5 lumbar type vertebral bodies. Alignment: Straightening of the normal lumbar lordosis. Mild curvature convex to the left. Vertebrae: No fracture or primary bone lesion. Discogenic endplate marrow changes on the right at L3-4 and L4-5. Conus medullaris and cauda equina: Conus extends to the L1 level. Conus and cauda equina appear normal. Paraspinal and other soft tissues: Negative Disc levels: L1-2: Previous left hemilaminectomy. Broad-based disc herniation more prominent in the left posterolateral direction. This indents the thecal sac but does not cause visible neural compression. L2-3: Previous left hemilaminectomy. Endplate osteophytes and bulging of the disc. No apparent compressive stenosis. Foraminal to extraforaminal encroachment on the right by osteophyte in disc does not appear to compress the L2 nerve, but could irritate that structure. L3-4: Endplate osteophytes and bulging of the disc. Mild facet and ligamentous hypertrophy. Mild stenosis of the lateral recesses and foramina but without definite neural compression. L4-5: Endplate osteophytes and broad-based protrusion of the disc. Mild facet and ligamentous hypertrophy. Stenosis of the lateral recesses and neural foramina that could cause neural compression on either or both sides. L5-S1: Mild bulging of the disc.  No canal or foraminal stenosis. IMPRESSION: Cervical region: No cord compression or primary cord lesion. Right paracentral disc herniation at C4-5 with slight upward migration, not apparently resulting in neural compression. Spondylosis at C5-6 and C6-7 with moderate right foraminal narrowing at C5-6 and mild bilateral foraminal narrowing at C6-7. Thoracic region: No fracture. No primary cord lesion. Degenerative spondylosis at multiple levels without apparent neural compression. Left posterolateral disc herniation at T7-8 which does contact the left side of the  cord and could be symptomatic. Ample subarachnoid spaces present dorsal to cord however. Lumbar region: No change appreciated since December. Previous left-sided decompression at L1-2 and L2-3. No ongoing left-sided neural compression visible at those levels. L3-4: Mild stenosis of the lateral recesses and foramina but without definite neural compression. L4-5: Bilateral lateral recess and foraminal stenosis because of encroachment by osteophyte in disc material. Neural compression could occur on either side at this level. Electronically Signed   By: Paulina Fusi M.D.   On: 02/18/2019 09:44   Mr Thoracic Spine Wo Contrast  Result Date: 02/18/2019 CLINICAL DATA:  Back pain and left lower extremity weakness over the last week. EXAM: MRI CERVICAL, THORACIC AND LUMBAR SPINE WITHOUT AND WITH CONTRAST TECHNIQUE: Multiplanar and multiecho pulse sequences of the cervical spine, to include the craniocervical junction and cervicothoracic junction, and thoracic and lumbar spine, were obtained without and with intravenous contrast. COMPARISON:  CT 02/12/2019.  Lumbar MRI 12/21/2018 CONTRAST:  10 cc Gadavist FINDINGS: MRI CERVICAL SPINE FINDINGS Alignment: Normal Vertebrae: No fracture or primary bone lesion. Cord: No cord compression or primary cord lesion. Posterior Fossa, vertebral arteries, paraspinal tissues: Negative Disc levels: No abnormality at C1-2, C2-3 or C3-4. C4-5: Right paracentral disc herniation with slight upward migration  indents the ventral subarachnoid space but does not compress the cord or show foraminal extension. C5-6: Endplate osteophytes and bulging of the disc more prominent towards the right. No compressive canal stenosis. Right foraminal narrowing of a moderate degree. C6-7: Bulging of the disc and bilateral uncovertebral prominence. No compressive central canal stenosis. Mild bilateral foraminal narrowing. C7-T1: Bulging of the disc.  No canal or foraminal stenosis. MRI THORACIC SPINE FINDINGS  Alignment:  No significant malalignment. Vertebrae: No fracture or primary bone lesion. Cord:  No primary cord lesion.  See below regarding stenosis. Paraspinal and other soft tissues: Negative Disc levels: T2-3: Mild noncompressive disc bulge. T4-5: Mild noncompressive disc bulge. T5-6: Shallow left posterolateral herniation without visible neural compression T6-7: Shallow central disc herniation without visible neural compression. T7-8: Central to left-sided disc herniation, contacting the left side of the cord. This could possibly be symptomatic. T8-9: Mild noncompressive disc bulge. T9-10: Mild noncompressive disc bulge. T10-11 through T12-L1: Mild noncompressive disc bulges. MRI LUMBAR SPINE FINDINGS Segmentation:  5 lumbar type vertebral bodies. Alignment: Straightening of the normal lumbar lordosis. Mild curvature convex to the left. Vertebrae: No fracture or primary bone lesion. Discogenic endplate marrow changes on the right at L3-4 and L4-5. Conus medullaris and cauda equina: Conus extends to the L1 level. Conus and cauda equina appear normal. Paraspinal and other soft tissues: Negative Disc levels: L1-2: Previous left hemilaminectomy. Broad-based disc herniation more prominent in the left posterolateral direction. This indents the thecal sac but does not cause visible neural compression. L2-3: Previous left hemilaminectomy. Endplate osteophytes and bulging of the disc. No apparent compressive stenosis. Foraminal to extraforaminal encroachment on the right by osteophyte in disc does not appear to compress the L2 nerve, but could irritate that structure. L3-4: Endplate osteophytes and bulging of the disc. Mild facet and ligamentous hypertrophy. Mild stenosis of the lateral recesses and foramina but without definite neural compression. L4-5: Endplate osteophytes and broad-based protrusion of the disc. Mild facet and ligamentous hypertrophy. Stenosis of the lateral recesses and neural foramina that could  cause neural compression on either or both sides. L5-S1: Mild bulging of the disc.  No canal or foraminal stenosis. IMPRESSION: Cervical region: No cord compression or primary cord lesion. Right paracentral disc herniation at C4-5 with slight upward migration, not apparently resulting in neural compression. Spondylosis at C5-6 and C6-7 with moderate right foraminal narrowing at C5-6 and mild bilateral foraminal narrowing at C6-7. Thoracic region: No fracture. No primary cord lesion. Degenerative spondylosis at multiple levels without apparent neural compression. Left posterolateral disc herniation at T7-8 which does contact the left side of the cord and could be symptomatic. Ample subarachnoid spaces present dorsal to cord however. Lumbar region: No change appreciated since December. Previous left-sided decompression at L1-2 and L2-3. No ongoing left-sided neural compression visible at those levels. L3-4: Mild stenosis of the lateral recesses and foramina but without definite neural compression. L4-5: Bilateral lateral recess and foraminal stenosis because of encroachment by osteophyte in disc material. Neural compression could occur on either side at this level. Electronically Signed   By: Paulina Fusi M.D.   On: 02/18/2019 09:44   Mr Lumbar Spine W Wo Contrast  Result Date: 02/18/2019 CLINICAL DATA:  Back pain and left lower extremity weakness over the last week. EXAM: MRI CERVICAL, THORACIC AND LUMBAR SPINE WITHOUT AND WITH CONTRAST TECHNIQUE: Multiplanar and multiecho pulse sequences of the cervical spine, to include the craniocervical junction and cervicothoracic junction, and thoracic and lumbar spine, were obtained without and with  intravenous contrast. COMPARISON:  CT 02/12/2019.  Lumbar MRI 12/21/2018 CONTRAST:  10 cc Gadavist FINDINGS: MRI CERVICAL SPINE FINDINGS Alignment: Normal Vertebrae: No fracture or primary bone lesion. Cord: No cord compression or primary cord lesion. Posterior Fossa, vertebral  arteries, paraspinal tissues: Negative Disc levels: No abnormality at C1-2, C2-3 or C3-4. C4-5: Right paracentral disc herniation with slight upward migration indents the ventral subarachnoid space but does not compress the cord or show foraminal extension. C5-6: Endplate osteophytes and bulging of the disc more prominent towards the right. No compressive canal stenosis. Right foraminal narrowing of a moderate degree. C6-7: Bulging of the disc and bilateral uncovertebral prominence. No compressive central canal stenosis. Mild bilateral foraminal narrowing. C7-T1: Bulging of the disc.  No canal or foraminal stenosis. MRI THORACIC SPINE FINDINGS Alignment:  No significant malalignment. Vertebrae: No fracture or primary bone lesion. Cord:  No primary cord lesion.  See below regarding stenosis. Paraspinal and other soft tissues: Negative Disc levels: T2-3: Mild noncompressive disc bulge. T4-5: Mild noncompressive disc bulge. T5-6: Shallow left posterolateral herniation without visible neural compression T6-7: Shallow central disc herniation without visible neural compression. T7-8: Central to left-sided disc herniation, contacting the left side of the cord. This could possibly be symptomatic. T8-9: Mild noncompressive disc bulge. T9-10: Mild noncompressive disc bulge. T10-11 through T12-L1: Mild noncompressive disc bulges. MRI LUMBAR SPINE FINDINGS Segmentation:  5 lumbar type vertebral bodies. Alignment: Straightening of the normal lumbar lordosis. Mild curvature convex to the left. Vertebrae: No fracture or primary bone lesion. Discogenic endplate marrow changes on the right at L3-4 and L4-5. Conus medullaris and cauda equina: Conus extends to the L1 level. Conus and cauda equina appear normal. Paraspinal and other soft tissues: Negative Disc levels: L1-2: Previous left hemilaminectomy. Broad-based disc herniation more prominent in the left posterolateral direction. This indents the thecal sac but does not cause  visible neural compression. L2-3: Previous left hemilaminectomy. Endplate osteophytes and bulging of the disc. No apparent compressive stenosis. Foraminal to extraforaminal encroachment on the right by osteophyte in disc does not appear to compress the L2 nerve, but could irritate that structure. L3-4: Endplate osteophytes and bulging of the disc. Mild facet and ligamentous hypertrophy. Mild stenosis of the lateral recesses and foramina but without definite neural compression. L4-5: Endplate osteophytes and broad-based protrusion of the disc. Mild facet and ligamentous hypertrophy. Stenosis of the lateral recesses and neural foramina that could cause neural compression on either or both sides. L5-S1: Mild bulging of the disc.  No canal or foraminal stenosis. IMPRESSION: Cervical region: No cord compression or primary cord lesion. Right paracentral disc herniation at C4-5 with slight upward migration, not apparently resulting in neural compression. Spondylosis at C5-6 and C6-7 with moderate right foraminal narrowing at C5-6 and mild bilateral foraminal narrowing at C6-7. Thoracic region: No fracture. No primary cord lesion. Degenerative spondylosis at multiple levels without apparent neural compression. Left posterolateral disc herniation at T7-8 which does contact the left side of the cord and could be symptomatic. Ample subarachnoid spaces present dorsal to cord however. Lumbar region: No change appreciated since December. Previous left-sided decompression at L1-2 and L2-3. No ongoing left-sided neural compression visible at those levels. L3-4: Mild stenosis of the lateral recesses and foramina but without definite neural compression. L4-5: Bilateral lateral recess and foraminal stenosis because of encroachment by osteophyte in disc material. Neural compression could occur on either side at this level. Electronically Signed   By: Paulina Fusi M.D.   On: 02/18/2019 09:44   US  Carotid Bilateral (at [facility]  Only)  Result Date: 02/17/2019 CLINICAL DATA:  Stroke for 12 hours EXAM: BILATERAL CAROTID DUPLEX ULTRASOUND TECHNIQUE: Wallace Cullens scale imaging, color Doppler and duplex ultrasound were performed of bilateral carotid and vertebral arteries in the neck. COMPARISON:  08/02/2016 FINDINGS: Criteria: Quantification of carotid stenosis is based on velocity parameters that correlate the residual internal carotid diameter with NASCET-based stenosis levels, using the diameter of the distal internal carotid lumen as the denominator for stenosis measurement. The following velocity measurements were obtained: RIGHT ICA: 101 cm/sec CCA: 138 cm/sec SYSTOLIC ICA/CCA RATIO:  0.7 ECA: 120 cm/sec LEFT ICA: 72 cm/sec CCA: 67 cm/sec SYSTOLIC ICA/CCA RATIO:  1.1 ECA: 141 cm/sec RIGHT CAROTID ARTERY: Mild irregular calcified plaque in the bulb. Low resistance internal carotid Doppler pattern is preserved. RIGHT VERTEBRAL ARTERY:  Antegrade. LEFT CAROTID ARTERY: Moderate predominately smooth calcified plaque in the bulb. Low resistance internal carotid Doppler pattern is preserved. LEFT VERTEBRAL ARTERY:  Antegrade. IMPRESSION: Less than 50% stenosis in the right and left internal carotid arteries. Antegrade vertebral arteries bilaterally. Electronically Signed   By: Jolaine Click M.D.   On: 02/17/2019 16:01   Dg Chest Portable 1 View  Result Date: 02/12/2019 CLINICAL DATA:  Status post fall this morning getting out of bed. EXAM: PORTABLE CHEST 1 VIEW COMPARISON:  PA and lateral chest 02/08/2019 and 02/28/2018. FINDINGS: The patient is status post CABG. Heart size is upper normal. Mild discoid atelectasis left lung base noted. Lungs otherwise clear. No pneumothorax or pleural effusion. No acute bony abnormality. There is partial visualization of fixation of a right humerus fracture and rotator cuff repair on the right. IMPRESSION: No acute disease. Electronically Signed   By: Drusilla Kanner M.D.   On: 02/12/2019 08:53   [name] Wo  Contrast  Result Date: 02/17/2019 CLINICAL DATA:  Left-sided weakness and fall. EXAM: MRI HEAD WITHOUT CONTRAST MRA HEAD WITHOUT CONTRAST TECHNIQUE: Multiplanar, multiecho pulse sequences of the brain and surrounding structures were obtained without intravenous contrast. Angiographic images of the head were obtained using MRA technique without contrast. COMPARISON:  MRA brain 11/02/2018 Head CT 02/17/2019 Brain MRI 02/08/2019 FINDINGS: MRI HEAD FINDINGS BRAIN: There is no acute infarct, acute hemorrhage or mass effect. The midline structures are normal. Old right thalamic lacunar infarct. Early confluent hyperintense T2-weighted signal of the periventricular and deep white matter, most commonly due to chronic ischemic microangiopathy. Generalized atrophy without lobar predilection. Susceptibility-sensitive sequences show no chronic microhemorrhage or superficial siderosis. SKULL AND UPPER CERVICAL SPINE: The visualized skull base, calvarium, upper cervical spine and extracranial soft tissues are normal. SINUSES/ORBITS: No fluid levels or advanced mucosal thickening. No mastoid or middle ear effusion. The orbits are normal. MRA HEAD FINDINGS POSTERIOR CIRCULATION: --Basilar artery: Normal. --Posterior cerebral arteries: Multifocal severe stenosis of both P2 segments. The right PCA is predominantly supplied by the posterior communicating artery. --Superior cerebellar arteries: Normal. --Inferior cerebellar arteries: Normal anterior and posterior inferior cerebellar arteries. ANTERIOR CIRCULATION: --Intracranial internal carotid arteries: Normal. --Anterior cerebral arteries: Normal. Hypoplastic right A1 segment, normal variant. --Middle cerebral arteries: There are multiple moderate-to-severe stenoses of the proximal right M2 branches. There is severe stenosis of the distal left M1 and proximal M2 branches. The left M1 stenosis is new compared to 11/02/18. --Posterior communicating arteries: Right p-comm gives rise  to the right PCA. No left p-comm is visualized. IMPRESSION: 1. No acute ischemic infarct. 2. Chronic ischemic microangiopathy and generalized atrophy. 3. New, severe stenosis of the distal left M1 MCA  segment. Unchanged bilateral MCA M2 range moderate-to-severe stenoses. 4. Unchanged multifocal severe stenoses of both posterior cerebral artery P2 segments. Electronically Signed   By: Deatra Robinson M.D.   On: 02/17/2019 15:38   Ct Head Code Stroke Wo Contrast  Result Date: 02/17/2019 CLINICAL DATA:  Code stroke. Fall today. New onset left lower extremity weakness. EXAM: CT HEAD WITHOUT CONTRAST TECHNIQUE: Contiguous axial images were obtained from the base of the skull through the vertex without intravenous contrast. COMPARISON:  CT head without contrast 02/12/2019. MRI brain 02/08/2019 FINDINGS: Brain: Moderate atrophy and white matter disease is stable. Basal ganglia are intact. Insular ribbon is normal bilaterally. No acute or focal cortical abnormalities are present. The ventricles are of proportionate to the degree of atrophy. No significant extraaxial fluid collection is present. The brainstem and cerebellum are within normal limits. Vascular: Atherosclerotic calcifications are again noted within the cavernous internal carotid arteries. There is no hyperdense vessel. Skull: Calvarium is intact. No focal lytic or blastic lesions are present. Sinuses/Orbits: Chronic right sphenoid sinus disease is present. The paranasal sinuses are otherwise clear. Bilateral lens replacements are noted. Globes and orbits are otherwise unremarkable. ASPECTS [facility] Stroke Program Early CT Score) - Ganglionic level infarction (caudate, lentiform nuclei, internal capsule, insula, M1-M3 cortex): 7/7 - Supraganglionic infarction (M4-M6 cortex): 3/3 Total score (0-10 with 10 being normal): 10/10 IMPRESSION: 1. No acute intracranial abnormality or significant interval change. 2. Stable moderate atrophy and white matter disease. 3.  ASPECTS is 10/10 These results were called by telephone at the time of interpretation on 02/17/2019 at 1:28 pm to Dr. Durward Mallard [facility] , who verbally acknowledged these results. Electronically Signed   By: Marin Roberts M.D.   On: 02/17/2019 13:28     CBC Recent Labs  Lab 02/22/19 2129 02/23/19 1510  WBC 13.7* 14.3*  HGB 12.7 12.2  HCT 43.2 42.0  PLT 369 321  MCV 80.7 80.3  MCH 23.7* 23.3*  MCHC 29.4* 29.0*  RDW 16.7* 16.8*  LYMPHSABS 3.4 3.7  MONOABS 1.6* 1.2*  EOSABS 0.1 0.1  BASOSABS 0.0 0.0    Chemistries  Recent Labs  Lab 02/19/19 0550 02/19/19 1133 02/20/19 0649 02/22/19 2129 02/23/19 1510  NA  --   --  139 137 137  K  --   --  4.0 4.2 3.9  CL  --   --  108 102 102  CO2  --   --  23 28 28   GLUCOSE  --  435* 279* 54* 235*  BUN  --   --  19 28* 21  CREATININE 0.70  --  0.70 0.79 0.70  CALCIUM  --   --  9.0 9.3 8.5*   ------------------------------------------------------------------------------------------------------------------ No results for input(s): CHOL, HDL, LDLCALC, TRIG, CHOLHDL, LDLDIRECT in the last 72 hours.  Lab Results  Component Value Date   HGBA1C 8.5 (H) 02/18/2019   Cardiac Enzymes No results for input(s): CKMB, TROPONINI, MYOGLOBIN in the last 168 hours.  Invalid input(s): CK ------------------------------------------------------------------------------------------------------------------    Component Value Date/Time   BNP 475.0 (H) 12/13/2018 1610     Shon Hale M.D on 02/25/2019 at 4:08 PM  Go to [email or web address] - for contact info  [facility] - Office  [phone]

## 2019-02-25 NOTE — Clinical Social Work Note (Signed)
LCSW following. Spoke with Lynnea Ferrier at [facility] to discuss pt's status. Per Lynnea Ferrier, they are offering her a bed and Lynnea Ferrier is working on [employer]. Spoke with family to update. Pt's gdtr, [name] [name], states that they have started a Medicaid application for pt and they are working on getting the additional documentation that is being requested. Pt will remain hospitalized at this time. Will follow.

## 2019-02-25 NOTE — Progress Notes (Signed)
Patient refuses the use of CPAP 

## 2019-02-25 NOTE — Progress Notes (Signed)
Physical Therapy Treatment Patient Details Name: [name] [name] MRN: [id number] DOB: [birth year] Today's Date: 02/25/2019    History of Present Illness [name] [name] is a 83 y.o. female with medical history significant for hypertension, insulin-dependent diabetes mellitus, history of CVA, chronic back pain, generalized weakness and debility, hypothyroidism, COPD, and OSA, now presenting to the emergency department after a fall at home.  The patient had just been discharged from the hospital several hours earlier after admission for weakness and debility.  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  Family was assisting her with ambulation this evening when she began to fall.  She was eased to the ground, did not hit her head, and denies any injury from this.  She denies any acute weakness, or any change in her condition since leaving the hospital, but reports that she is too weak to get up on her own and family was not strong enough to help her up either.  They called EMS and she was brought back to the ED.  Patient denies fevers, chills, headache, acute numbness or weakness, chest pain, palpitations, cough, or any change in her chronic exertional dyspnea.  Dysuria has improved and she     PT Comments    Pt supine in bed with daughter and grandson in room and willing to participate.  No reports of pain today, just feels weak.  Pt required mod assistance with bed mobility with supine to sit.  Upon sitting moderate assistance to reduce posterior lean to Rt, unable to sit upright without assistance.  Mod assistance with transfer to standing and max assistance iwht SPT to chair.  Pt with min to no movements with Lt LE, required manual assistance to move.  EOS pt left in chair with call bell  within reach, chair alarm set and family in room.  Aids requested to replace pure wick and RN aware of status.  No reoprts of pain through session, was limited by fatigue.    Follow Up Recommendations  SNF     Equipment Recommendations  None recommended by PT    Recommendations for Other Services       Precautions / Restrictions Precautions Precautions: Fall Restrictions Weight Bearing Restrictions: No    Mobility  Bed Mobility Overal bed mobility: Needs Assistance Bed Mobility: Supine to Sit   Sidelying to sit: Mod assist       General bed mobility comments: slow labored movement; posterior lean to Rt, required mod A for sitting balance  Transfers Overall transfer level: Needs assistance   Transfers: Sit to/from Stand;Stand Pivot Transfers Sit to Stand: Mod assist Stand pivot transfers: Max assist       General transfer comment: slow labored movement, diffiuclty secondary to LLE weakness, required assistance for Lt LE movements  Ambulation/Gait Ambulation/Gait assistance: Max assist Gait Distance (Feet): 3 Feet Assistive device: Rolling walker (2 wheeled) Gait Pattern/deviations: Decreased step length - right;Decreased step length - left;Decreased stance time - left;Decreased stride length Gait velocity: slow   General Gait Details: limited to 3-4 slow unsteady labored steps with diffiuclty advancing LLE due to weakness and lack of dorsiflexion   Stairs             Wheelchair Mobility    Modified Rankin (Stroke Patients Only)  Balance                                            Cognition Arousal/Alertness: Awake/alert Behavior During Therapy: WFL for tasks assessed/performed Overall Cognitive Status: Within Functional Limits for tasks assessed                                        Exercises General Exercises - Lower Extremity Ankle Circles/Pumps: AROM;5 reps;Both;Seated    General Comments         Pertinent Vitals/Pain Pain Assessment: No/denies pain    Home Living                      Prior Function            PT Goals (current goals can now be found in the care plan section)      Frequency    Min 3X/week      PT Plan Current plan remains appropriate    Co-evaluation              AM-PAC PT "6 Clicks" Mobility   Outcome Measure  Help needed turning from your back to your side while in a flat bed without using bedrails?: A Little Help needed moving from lying on your back to sitting on the side of a flat bed without using bedrails?: A Lot Help needed moving to and from a bed to a chair (including a wheelchair)?: A Lot Help needed standing up from a chair using your arms (e.g., wheelchair or bedside chair)?: A Lot Help needed to walk in hospital room?: A Lot Help needed climbing 3-5 steps with a railing? : Total 6 Click Score: 12    End of Session Equipment Utilized During Treatment: Gait belt Activity Tolerance: Patient tolerated treatment well;Patient limited by fatigue Patient left: in chair;with call bell/phone within reach;with chair alarm set;with family/visitor present Nurse Communication: Mobility status(Aids asked to replace pure wick) PT Visit Diagnosis: Unsteadiness on feet (R26.81);Muscle weakness (generalized) (M62.81);Other abnormalities of gait and mobility (R26.89)     Time: 8937-3428 PT Time Calculation (min) (ACUTE ONLY): 30 min  Charges:  $Therapeutic Activity: 23-37 mins                     [address] LPTA; CBIS [phone]  Juel Burrow 02/25/2019, 3:40 PM

## 2019-02-26 ENCOUNTER — Inpatient Hospital Stay
Admission: RE | Admit: 2019-02-26 | Discharge: 2019-03-12 | Disposition: A | Discharge status: Home / Self Care | Payer: Medicare HMO | Source: Ambulatory Visit | Attending: Internal Medicine | Admitting: Internal Medicine

## 2019-02-26 DIAGNOSIS — E162 Hypoglycemia, unspecified: Secondary | ICD-10-CM | POA: Diagnosis not present

## 2019-02-26 LAB — GLUCOSE, CAPILLARY
Glucose-Capillary: 198 mg/dL — ABNORMAL HIGH (ref 70–99)
Glucose-Capillary: 238 mg/dL — ABNORMAL HIGH (ref 70–99)
Glucose-Capillary: 337 mg/dL — ABNORMAL HIGH (ref 70–99)

## 2019-02-26 MED ORDER — FLUCONAZOLE 200 MG PO TABS: 200.0000 mg | ORAL_TABLET | Freq: Every day | ORAL | 0 refills | Status: AC

## 2019-02-26 MED ORDER — ACETAMINOPHEN 325 MG PO TABS: 650.0000 mg | ORAL_TABLET | Freq: Four times a day (QID) | ORAL | 0 refills | Status: AC | PRN

## 2019-02-26 MED ORDER — INSULIN NPH ISOPHANE & REGULAR (70-30) 100 UNIT/ML ~~LOC~~ SUSP: 30.0000 [IU] | Freq: Two times a day (BID) | SUBCUTANEOUS | 11 refills | Status: DC

## 2019-02-26 MED ORDER — LORAZEPAM 0.5 MG PO TABS: 0.5000 mg | ORAL_TABLET | Freq: Two times a day (BID) | ORAL | 0 refills | Status: DC | PRN

## 2019-02-26 MED ORDER — FOSFOMYCIN TROMETHAMINE 3 G PO PACK: 3.0000 g | PACK | Freq: Once | ORAL | 0 refills | Status: AC

## 2019-02-26 MED ORDER — SODIUM CHLORIDE 0.9 % IV SOLN
INTRAVENOUS | Status: DC | PRN
  Administered 2019-02-26: 250 mL via INTRAVENOUS

## 2019-02-26 MED ORDER — INSULIN GLARGINE 100 UNIT/ML ~~LOC~~ SOLN
18.0000 [IU] | Freq: Every day | SUBCUTANEOUS | Status: DC
  Filled 2019-02-26 (×3): qty 0.18

## 2019-02-26 MED ORDER — FOSFOMYCIN TROMETHAMINE 3 G PO PACK
3.0000 g | PACK | Freq: Once | ORAL | Status: AC
  Administered 2019-02-26: 3 g via ORAL
  Filled 2019-02-26: qty 3

## 2019-02-26 MED ORDER — OXYCODONE HCL 5 MG PO TABS: 5.0000 mg | ORAL_TABLET | ORAL | 0 refills | Status: DC | PRN

## 2019-02-26 NOTE — Clinical Social Work Placement (Addendum)
   CLINICAL SOCIAL WORK PLACEMENT  NOTE  Date:  02/26/2019  Patient Details  Name: [name] [name] MRN: [id number] Date of Birth: 02-28-1936  Clinical Social Work is seeking post-discharge placement for this patient at the   level of care (*CSW will initial, date and re-position this form in  chart as items are completed):      Patient/family provided with [facility] Clinical Social Work Department's list of facilities offering this level of care within the geographic area requested by the patient (or if unable, by the patient's family).      Patient/family informed of their freedom to choose among providers that offer the needed level of care, that participate in Medicare, Medicaid or managed care program needed by the patient, have an available bed and are willing to accept the patient.      Patient/family informed of [facility]'s ownership interest in [facility], as well as of the fact that they are under no obligation to receive care at these facilities.  PASRR submitted to EDS on       PASRR number received on       Existing PASRR number confirmed on       FL2 transmitted to all facilities in geographic area requested by pt/family on       FL2 transmitted to all facilities within larger geographic area on       Patient informed that his/her managed care company has contracts with or will negotiate with certain facilities, including the following:            Patient/family informed of bed offers received.  Patient chooses bed at       Physician recommends and patient chooses bed at      Patient to be transferred to [facility] on 02/26/19.  Patient to be transferred to facility by wheelchair     Patient family notified on 02/26/19 of transfer.  Name of family member notified:  [facility]     PHYSICIAN       Additional Comment: Received call from [place] at [facility] stating that insurance authorized pt for SNF rehab and they can take pt today.  Updated MD and pt's family. Will send dc clinical electronically. RN to call report and transport pt when ready. Pt going to room 152 at [facility]. There are no other CSW needs for dc.   _______________________________________________ Elliot Gault, LCSW 02/26/2019, 11:24 AM

## 2019-02-26 NOTE — Discharge Summary (Signed)
[name] [name], is a 83 y.o. female  DOB 1936-04-23  MRN [id number].  Admission date:  02/22/2019  Admitting Physician  Briscoe Deutscher, MD  Discharge Date:  02/26/2019   Primary MD  Oval Linsey, MD  Recommendations for primary care physician for things to follow:   1)Please give fosfomycin antibiotic 3 g orally x1 dose around 10 AM on 03/01/2019 2) NPH insulin has been reduced to 30 units twice a day, consider additional sliding scale use if blood sugars running high  Admission Diagnosis  Malaise [R53.81] Weakness [R53.1] Hypoglycemia [E16.2]   Discharge Diagnosis  Malaise [R53.81] Weakness [R53.1] Hypoglycemia [E16.2]    Principal Problem:   Hypoglycemia Active Problems:   Hypothyroidism   DM type 2 causing complication (HCC)   Essential hypertension   Chronic back pain   Chronic obstructive pulmonary disease (HCC)   OSA (obstructive sleep apnea)   CAD (coronary artery disease)   Generalized weakness   Symptomatic Yeast UTI      Past Medical History:  Diagnosis Date  . Arthritis   . CAD (coronary artery disease)    Multivessel status post CABG 2001  . Chronic obstructive pulmonary disease (HCC)   . Chronic pain   . DDD (degenerative disc disease), lumbar   . Diastolic CHF (HCC)   . Essential hypertension   . Hematuria   . History of CVA (cerebrovascular accident)    2013 - lacunar infarct right thalamus - residual left side of  lip numb  and left eye vision worse (which corrented lens implant from cataract extraction)  . History of MI (myocardial infarction)    July 2001  . Hyperlipidemia   . Hypothyroidism   . Insulin dependent type 2 diabetes mellitus (HCC)   . Lesion of bladder   . Lower urinary tract symptoms (LUTS)   . OSA (obstructive sleep apnea)   . Peripheral neuropathy   . Psoriasis   . SUI (stress urinary incontinence, female)   . Varicose veins     Past  Surgical History:  Procedure Laterality Date  . APPENDECTOMY  age 89  . CARDIAC CATHETERIZATION  1991   No sig. cad  . CARDIAC CATHETERIZATION  07-01-2000   high grade in-stent restenosis  . CARDIAC CATHETERIZATION  04-30-2001   single native vessel cad/ graft patent  . CARDIAC CATHETERIZATION  02-13-2006  dr [place] cad with total occluded LAD  and  70% ostial of small ramus/  grafts patent/  normal ef  . CARDIAC CATHETERIZATION  01-20-2000  dr Clifton James   essentially unchanged;  severe single vessel cad with diffuse disease throughout CFX and RCA/ ef 66%;  chf with preserved LVSF  . CARDIOVASCULAR STRESS TEST  08-28-2011   DR ROTHBART   NEGATIVE NUCLEAR STUDY/  GLOBAL LVSF/  EF 65%  . CARPAL TUNNEL RELEASE Bilateral yrs ago  . CATARACT EXTRACTION W/ INTRAOCULAR LENS  IMPLANT, BILATERAL  2013  . CHOLECYSTECTOMY  1990  . COLONOSCOPY N/A 07/26/2015   Procedure: COLONOSCOPY;  Surgeon: Ok Anis  Cline Crock, MD;  Location: [facility];  Service: Endoscopy;  Laterality: N/A;  1200  . CORONARY ANGIOPLASTY  11-13-1999   balloon angioplasty to mLAD , d2 of LAD  . CORONARY ANGIOPLASTY WITH STENT PLACEMENT  01-17-2000   PCI stenting to mLAD & D2 of LAD  . CORONARY ARTERY BYPASS GRAFT  07-02-2000   DR Tressie Stalker   SVG to diagonal, LIMA to LAD  . CYSTOSCOPY W/ RETROGRADES Bilateral 08/09/2014   Procedure: CYSTOSCOPY, BILATERAL RETROGRADE, HYDRODISTENSION, BLADDER BIOPSY WITH FULGERATION, INSTILL PYRIDIUM AND MARCAINE ;  Surgeon: Anner Crete, MD;  Location: [facility];  Service: Urology;  Laterality: Bilateral;  . KNEE ARTHROSCOPY Left 10-18-2003   SYNOVECTOMY  . LUMBAR SPINE SURGERY  x2    yrs ago  . ORIF HUMERUS FRACTURE Right 04-18-2008  . REPAIR FLEXOR TENDON HAND Right 09-20-2008   CARPI RADIALIS TENDON TRANSFER TO EXTENSOR TO INDEX, MIDDLE, RING , LITTLE FINGERS AND DIGITI MININI  . ROTATOR CUFF REPAIR Right 1995  . TOTAL ABDOMINAL HYSTERECTOMY W/ BILATERAL  SALPINGOOPHORECTOMY  age 67  . TRANSTHORACIC ECHOCARDIOGRAM  12-12-2013   MILD LVH/  EF 60-65%/  GRADE I DIASTOLIC DYSFUNCTION/  MILD LAE      HPI  from the history and physical done on the day of admission:     Patient coming from: Home   Chief Complaint: Fall, generalized weakness   HPI: [name] [name] is a 83 y.o. female with medical history significant for hypertension, insulin-dependent diabetes mellitus, history of CVA, chronic back pain, generalized weakness and debility, hypothyroidism, COPD, and OSA, now presenting to the emergency department after a fall at home.  The patient had just been discharged from the hospital several hours earlier after admission for weakness and debility.  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  Family was assisting her with ambulation this evening when she began to fall.  She was eased to the ground, did not hit her head, and denies any injury from this.  She denies any acute weakness, or any change in her condition since leaving the hospital, but reports that she is too weak to get up on her own and family was not strong enough to help her up either.  They called EMS and she was brought back to the ED.  Patient denies fevers, chills, headache, acute numbness or weakness, chest pain, palpitations, cough, or any change in her chronic exertional dyspnea.  Dysuria has improved and she denies flank pain.   ED Course: Upon arrival to the ED, patient is found to be afebrile, saturating well on room air, slightly bradycardic, and with vitals otherwise normal.  Chemistry panel is notable for serum glucose of 54 and CBC features a leukocytosis to 13,700.  Patient was too weak in general to get up in the ED without intensive assistance.  She is  tolerating food without incident in the ED.  She will be observed in the hospital for ongoing evaluation and management of hypoglycemia and an insulin-dependent diabetic.    Hospital Course:    Brief Narrative: Per HPI:  [name] [name] a 83 y.o.femalewith medical history significant forhypertension, insulin-dependent diabetes mellitus, history of CVA, chronic back pain, generalized weakness and debility, hypothyroidism, COPD, and OSA, now presenting to the emergency department  after a fall at home. The patient had just been discharged from the hospital several hours earlier after admission for weakness and debility. 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 Family was assisting her with ambulation this evening when she began to fall. She was eased to the ground, did not hit her head, and denies any injury from this. She denies any acute weakness, or any change in her condition since leaving the hospital, but reports that she is too weak to get up on her own and family was not strong enough to help her up either. They called EMS and she was brought back to the ED. Patient denies fevers, chills, headache, acute numbness or weakness, chest pain, palpitations, cough, or any change in her chronic exertional dyspnea. Dysuria has improved and she denies flank pain.  Patient was readmitted with persistent generalized weakness with inability of family to care for patient at home. She is also noted to have hypoglycemia in the setting of insulin-dependent diabetes. She is on ongoing treatment for Candida UTI.   Plan:- 1.Hypoglycemia-resolved; insulin-dependent DM -Presents after a recurrent fall at home and found to have serum glucose of 54 in ED---suspect due to  poor oral intake, --A1c was 8.5% last month and she is managed at home with insulin NPH 45 units BID. NPH 70/30 insulin has been reduced to 30 units twice a day, consider additional sliding scale use if blood sugars running high  2.Generalized Weakness/Debility/Falls-- -SNF recommended by PT during recent admission, insurance would not cover, and she went home with [facility] PT, but returns with ongoing weakness and debility and yet another fall at home with hypoglycemia possibly contributing---per family no significant head injury --Family at bedside does not want to take her home again, feeling that they are not equipped to help her d/t being physically unable to get her up when she is too weak to stand on her own -PT reevaluated patient again,  PT again recommends SNF rehab for patient safety. Transfer to SNF Rehab.  3.COPD; OSA -No cough, wheeze, or SOB on admission--stable -Continue Spiriva, as-needed Duonebs, and CPAP qHS  4.CAD--stable -No anginal complaints -Continue DAPT, statin, and ACE  5.Hypertension -Stable, -Continue lisinopril and Norvasc as tolerated  6.Chronic back pain -No pain complaints on admission -Continue home regimen with Neurontin and as-needed oxycodone  7.Hypothyroidism -TSH wnl two wks ago -Continue Synthroid  8.Yeast UTI -Continue Fluconazoleuntil 03/01/19  9)E.coli bacterial UTI--- patient with dysuria, Urine cx E. coli, sensitivities pending, patient with history of ESBL E. Coli, she did receive IV Rocephinon 02/25/2019, also received fosfomycin 3 g x 1 on 02/26/2019, patient should get another dose of oral fosfomycin 3 g on 03/01/2019 at SNF facility  Code Status:Full Family Communication:None at bedside at this time Disposition Plan: SNF.  Consultants:  None  Procedures:  None  Antimicrobials:  Fluconazole for fungal UTI. Will complete treatment on 02/26/2019  Rocephine 1 gm q 24 hr -since  02/25/19  Fosfomycin 3 gm po x 1 on 02/26/2019 (repeat dose advised on 03/01/2019)   Discharge Condition: Stable  Follow UP  Contact information for after-discharge care    Destination    [facility] Preferred SNF .   Service:  Skilled Nursing Contact information: [address] [place] [address] [phone]  Diet and Activity recommendation:  As advised  Discharge Instructions    Discharge Instructions    Call MD for:  difficulty breathing, headache or visual disturbances   Complete by:  As directed    Call MD for:  persistant dizziness or light-headedness   Complete by:  As directed    Call MD for:  persistant nausea and vomiting   Complete by:  As directed    Call MD for:  severe uncontrolled pain   Complete by:  As directed    Call MD for:  temperature >100.4   Complete by:  As directed    Diet - low sodium heart healthy   Complete by:  As directed    Diet Carb Modified   Complete by:  As directed    Discharge instructions   Complete by:  As directed    1) please give fosfomycin antibiotic 3 g orally x1 dose around 10 AM on 03/01/2019 2) NPH insulin has been reduced to 30 units twice a day, consider additional sliding scale use if blood sugars running high   Increase activity slowly   Complete by:  As directed    Fall Precautions        Discharge Medications     Allergies as of 02/26/2019      Reactions   Codeine Itching      Medication List    TAKE these medications   acetaminophen 325 MG tablet Commonly known as:  TYLENOL Take 2 tablets (650 mg total) by mouth every 6 (six) hours as needed for mild pain or headache (or Fever >/= 101).   amLODipine 5 MG tablet Commonly known as:  NORVASC Take 5 mg by mouth daily.   aspirin EC 81 MG tablet Take 1 tablet (81 mg total) by mouth daily.   benzonatate 200 MG capsule Commonly known as:  TESSALON Take 1 capsule (200 mg total) by mouth 3 (three) times daily  as needed for cough.   citalopram 20 MG tablet Commonly known as:  CELEXA Take 1 tablet (20 mg total) by mouth daily.   clopidogrel 75 MG tablet Commonly known as:  PLAVIX Take 1 tablet (75 mg total) by mouth daily for 30 days.   fluconazole 200 MG tablet Commonly known as:  DIFLUCAN Take 1 tablet (200 mg total) by mouth daily for 3 days.   fosfomycin 3 g Pack Commonly known as:  MONUROL Take 3 g by mouth once for 1 dose. Please give Fosfomycin antibiotic 3 g orally x1 dose around 10 AM on 03/01/2019   gabapentin 300 MG capsule Commonly known as:  NEURONTIN Take 300 mg by mouth 2 (two) times daily.   insulin NPH-regular Human (70-30) 100 UNIT/ML injection Inject 30 Units into the skin 2 (two) times daily. What changed:  how much to take   ipratropium-albuterol 0.5-2.5 (3) MG/3ML Soln Commonly known as:  DUONEB Inhale 3 mLs into the lungs 4 (four) times daily as needed (for shortness of breath).   levothyroxine 25 MCG tablet Commonly known as:  SYNTHROID, LEVOTHROID Take 25 mcg by mouth daily.   lisinopril 10 MG tablet Commonly known as:  PRINIVIL,ZESTRIL Take 10 mg by mouth daily.   LORazepam 0.5 MG tablet Commonly known as:  Ativan Take 1 tablet (0.5 mg total) by mouth every 12 (twelve) hours as needed for anxiety or sleep. What changed:    medication strength  how much to take  when to take this  reasons to take this   metFORMIN  500 MG tablet Commonly known as:  GLUCOPHAGE Take 500 mg by mouth 2 (two) times daily with a meal.   nitroGLYCERIN 0.4 MG SL tablet Commonly known as:  NITROSTAT Place 0.4 mg under the tongue every 5 (five) minutes as needed for chest pain.   oxyCODONE 5 MG immediate release tablet Commonly known as:  Oxy IR/ROXICODONE Take 1 tablet (5 mg total) by mouth every 4 (four) hours as needed (Moderate pain).   pantoprazole 40 MG tablet Commonly known as:  PROTONIX Take 1 tablet (40 mg total) by mouth daily at 6 (six) AM.     simvastatin 20 MG tablet Commonly known as:  ZOCOR Take 20 mg by mouth daily.       Major procedures and Radiology Reports - PLEASE review detailed and final reports for all details, in brief -   Dg Chest 2 View  Result Date: 02/08/2019 CLINICAL DATA:  Headache and dizziness. EXAM: CHEST - 2 VIEW COMPARISON:  01/13/2019 FINDINGS: The cardiac silhouette, mediastinal and hilar contours are within normal limits and stable. There are mild chronic bronchitic type lung changes with streaky areas of scarring change. No acute infiltrates, edema or effusions. The bony thorax is intact. IMPRESSION: Chronic lung changes but no acute pulmonary findings. Electronically Signed   By: Rudie Meyer M.D.   On: 02/08/2019 19:53   Ct Head Wo Contrast  Result Date: 02/12/2019 CLINICAL DATA:  Ataxia with stroke suspected EXAM: CT HEAD WITHOUT CONTRAST TECHNIQUE: Contiguous axial images were obtained from the base of the skull through the vertex without intravenous contrast. COMPARISON:  02/08/2019 FINDINGS: Brain: No evidence of acute infarction, hemorrhage, hydrocephalus, extra-axial collection or mass lesion/mass effect. Mild for age cerebral volume loss. Chronic small vessel ischemia in the white matter. Vascular: No hyperdense vessel or unexpected calcification. Skull: Normal. Negative for fracture or focal lesion. Sinuses/Orbits: No acute finding. IMPRESSION: Senescent changes without acute finding. Electronically Signed   By: Marnee Spring M.D.   On: 02/12/2019 09:02   Ct Head Wo Contrast  Result Date: 02/08/2019 CLINICAL DATA:  83 year old female with a history of dizziness EXAM: CT HEAD WITHOUT CONTRAST TECHNIQUE: Contiguous axial images were obtained from the base of the skull through the vertex without intravenous contrast. COMPARISON:  MR 02/08/2019, CT 01/03/2019 FINDINGS: Brain: No acute intracranial hemorrhage. No midline shift or mass effect. Gray-white differentiation maintained. Unremarkable  configuration of the ventricles. Confluent hypodensity in the bilateral periventricular white matter. Vascular: Intracranial atherosclerosis of anterior circulation Skull: No acute displaced fracture.  No aggressive bony lesions. Sinuses/Orbits: No acute finding. Other: None IMPRESSION: Negative for acute intracranial abnormality. Evidence of chronic microvascular ischemic disease Electronically Signed   By: Gilmer Mor D.O.   On: 02/08/2019 19:48   [name] Contrast  Result Date: 02/17/2019 CLINICAL DATA:  Left-sided weakness and fall. EXAM: MRI HEAD WITHOUT CONTRAST MRA HEAD WITHOUT CONTRAST TECHNIQUE: Multiplanar, multiecho pulse sequences of the brain and surrounding structures were obtained without intravenous contrast. Angiographic images of the head were obtained using MRA technique without contrast. COMPARISON:  MRA brain 11/02/2018 Head CT 02/17/2019 Brain MRI 02/08/2019 FINDINGS: MRI HEAD FINDINGS BRAIN: There is no acute infarct, acute hemorrhage or mass effect. The midline structures are normal. Old right thalamic lacunar infarct. Early confluent hyperintense T2-weighted signal of the periventricular and deep white matter, most commonly due to chronic ischemic microangiopathy. Generalized atrophy without lobar predilection. Susceptibility-sensitive sequences show no chronic microhemorrhage or superficial siderosis. SKULL AND UPPER CERVICAL SPINE: The visualized skull base, calvarium,  upper cervical spine and extracranial soft tissues are normal. SINUSES/ORBITS: No fluid levels or advanced mucosal thickening. No mastoid or middle ear effusion. The orbits are normal. MRA HEAD FINDINGS POSTERIOR CIRCULATION: --Basilar artery: Normal. --Posterior cerebral arteries: Multifocal severe stenosis of both P2 segments. The right PCA is predominantly supplied by the posterior communicating artery. --Superior cerebellar arteries: Normal. --Inferior cerebellar arteries: Normal anterior and posterior inferior  cerebellar arteries. ANTERIOR CIRCULATION: --Intracranial internal carotid arteries: Normal. --Anterior cerebral arteries: Normal. Hypoplastic right A1 segment, normal variant. --Middle cerebral arteries: There are multiple moderate-to-severe stenoses of the proximal right M2 branches. There is severe stenosis of the distal left M1 and proximal M2 branches. The left M1 stenosis is new compared to 11/02/18. --Posterior communicating arteries: Right p-comm gives rise to the right PCA. No left p-comm is visualized. IMPRESSION: 1. No acute ischemic infarct. 2. Chronic ischemic microangiopathy and generalized atrophy. 3. New, severe stenosis of the distal left M1 MCA segment. Unchanged bilateral MCA M2 range moderate-to-severe stenoses. 4. Unchanged multifocal severe stenoses of both posterior cerebral artery P2 segments. Electronically Signed   By: Deatra Robinson M.D.   On: 02/17/2019 15:38   [name] Contrast (neuro Protocol)  Result Date: 02/08/2019 CLINICAL DATA:  Ataxia, vertigo EXAM: MRI HEAD WITHOUT CONTRAST TECHNIQUE: Multiplanar, multiecho pulse sequences of the brain and surrounding structures were obtained without intravenous contrast. COMPARISON:  Brain MRI 11/02/2018 FINDINGS: BRAIN: There is no acute infarct, acute hemorrhage, hydrocephalus or extra-axial collection. The midline structures are normal. No midline shift or other mass effect. Diffuse confluent hyperintense T2-weighted signal within the periventricular, deep and juxtacortical white matter, most commonly due to chronic ischemic microangiopathy. Generalized atrophy without lobar predilection. Susceptibility-sensitive sequences show no chronic microhemorrhage or superficial siderosis. Small, old left cerebellar infarct and bilateral thalamic infarcts. VASCULAR: Major intracranial arterial and venous sinus flow voids are normal. SKULL AND UPPER CERVICAL SPINE: Calvarial bone marrow signal is normal. There is no skull base mass. Visualized  upper cervical spine and soft tissues are normal. SINUSES/ORBITS: No fluid levels or advanced mucosal thickening. No mastoid or middle ear effusion. The orbits are normal. IMPRESSION: Chronic ischemic microangiopathy and generalized atrophy without acute intracranial abnormality. Electronically Signed   By: Deatra Robinson M.D.   On: 02/08/2019 18:36   Mr Cervical Spine Wo Contrast  Result Date: 02/18/2019 CLINICAL DATA:  Back pain and left lower extremity weakness over the last week. EXAM: MRI CERVICAL, THORACIC AND LUMBAR SPINE WITHOUT AND WITH CONTRAST TECHNIQUE: Multiplanar and multiecho pulse sequences of the cervical spine, to include the craniocervical junction and cervicothoracic junction, and thoracic and lumbar spine, were obtained without and with intravenous contrast. COMPARISON:  CT 02/12/2019.  Lumbar MRI 12/21/2018 CONTRAST:  10 cc Gadavist FINDINGS: MRI CERVICAL SPINE FINDINGS Alignment: Normal Vertebrae: No fracture or primary bone lesion. Cord: No cord compression or primary cord lesion. Posterior Fossa, vertebral arteries, paraspinal tissues: Negative Disc levels: No abnormality at C1-2, C2-3 or C3-4. C4-5: Right paracentral disc herniation with slight upward migration indents the ventral subarachnoid space but does not compress the cord or show foraminal extension. C5-6: Endplate osteophytes and bulging of the disc more prominent towards the right. No compressive canal stenosis. Right foraminal narrowing of a moderate degree. C6-7: Bulging of the disc and bilateral uncovertebral prominence. No compressive central canal stenosis. Mild bilateral foraminal narrowing. C7-T1: Bulging of the disc.  No canal or foraminal stenosis. MRI THORACIC SPINE FINDINGS Alignment:  No significant malalignment. Vertebrae: No fracture or primary bone lesion. Cord:  No primary cord lesion.  See below regarding stenosis. Paraspinal and other soft tissues: Negative Disc levels: T2-3: Mild noncompressive disc bulge.  T4-5: Mild noncompressive disc bulge. T5-6: Shallow left posterolateral herniation without visible neural compression T6-7: Shallow central disc herniation without visible neural compression. T7-8: Central to left-sided disc herniation, contacting the left side of the cord. This could possibly be symptomatic. T8-9: Mild noncompressive disc bulge. T9-10: Mild noncompressive disc bulge. T10-11 through T12-L1: Mild noncompressive disc bulges. MRI LUMBAR SPINE FINDINGS Segmentation:  5 lumbar type vertebral bodies. Alignment: Straightening of the normal lumbar lordosis. Mild curvature convex to the left. Vertebrae: No fracture or primary bone lesion. Discogenic endplate marrow changes on the right at L3-4 and L4-5. Conus medullaris and cauda equina: Conus extends to the L1 level. Conus and cauda equina appear normal. Paraspinal and other soft tissues: Negative Disc levels: L1-2: Previous left hemilaminectomy. Broad-based disc herniation more prominent in the left posterolateral direction. This indents the thecal sac but does not cause visible neural compression. L2-3: Previous left hemilaminectomy. Endplate osteophytes and bulging of the disc. No apparent compressive stenosis. Foraminal to extraforaminal encroachment on the right by osteophyte in disc does not appear to compress the L2 nerve, but could irritate that structure. L3-4: Endplate osteophytes and bulging of the disc. Mild facet and ligamentous hypertrophy. Mild stenosis of the lateral recesses and foramina but without definite neural compression. L4-5: Endplate osteophytes and broad-based protrusion of the disc. Mild facet and ligamentous hypertrophy. Stenosis of the lateral recesses and neural foramina that could cause neural compression on either or both sides. L5-S1: Mild bulging of the disc.  No canal or foraminal stenosis. IMPRESSION: Cervical region: No cord compression or primary cord lesion. Right paracentral disc herniation at C4-5 with slight upward  migration, not apparently resulting in neural compression. Spondylosis at C5-6 and C6-7 with moderate right foraminal narrowing at C5-6 and mild bilateral foraminal narrowing at C6-7. Thoracic region: No fracture. No primary cord lesion. Degenerative spondylosis at multiple levels without apparent neural compression. Left posterolateral disc herniation at T7-8 which does contact the left side of the cord and could be symptomatic. Ample subarachnoid spaces present dorsal to cord however. Lumbar region: No change appreciated since December. Previous left-sided decompression at L1-2 and L2-3. No ongoing left-sided neural compression visible at those levels. L3-4: Mild stenosis of the lateral recesses and foramina but without definite neural compression. L4-5: Bilateral lateral recess and foraminal stenosis because of encroachment by osteophyte in disc material. Neural compression could occur on either side at this level. Electronically Signed   By: Paulina Fusi M.D.   On: 02/18/2019 09:44   Mr Thoracic Spine Wo Contrast  Result Date: 02/18/2019 CLINICAL DATA:  Back pain and left lower extremity weakness over the last week. EXAM: MRI CERVICAL, THORACIC AND LUMBAR SPINE WITHOUT AND WITH CONTRAST TECHNIQUE: Multiplanar and multiecho pulse sequences of the cervical spine, to include the craniocervical junction and cervicothoracic junction, and thoracic and lumbar spine, were obtained without and with intravenous contrast. COMPARISON:  CT 02/12/2019.  Lumbar MRI 12/21/2018 CONTRAST:  10 cc Gadavist FINDINGS: MRI CERVICAL SPINE FINDINGS Alignment: Normal Vertebrae: No fracture or primary bone lesion. Cord: No cord compression or primary cord lesion. Posterior Fossa, vertebral arteries, paraspinal tissues: Negative Disc levels: No abnormality at C1-2, C2-3 or C3-4. C4-5: Right paracentral disc herniation with slight upward migration indents the ventral subarachnoid space but does not compress the cord or show foraminal  extension. C5-6: Endplate osteophytes and bulging of the disc more  prominent towards the right. No compressive canal stenosis. Right foraminal narrowing of a moderate degree. C6-7: Bulging of the disc and bilateral uncovertebral prominence. No compressive central canal stenosis. Mild bilateral foraminal narrowing. C7-T1: Bulging of the disc.  No canal or foraminal stenosis. MRI THORACIC SPINE FINDINGS Alignment:  No significant malalignment. Vertebrae: No fracture or primary bone lesion. Cord:  No primary cord lesion.  See below regarding stenosis. Paraspinal and other soft tissues: Negative Disc levels: T2-3: Mild noncompressive disc bulge. T4-5: Mild noncompressive disc bulge. T5-6: Shallow left posterolateral herniation without visible neural compression T6-7: Shallow central disc herniation without visible neural compression. T7-8: Central to left-sided disc herniation, contacting the left side of the cord. This could possibly be symptomatic. T8-9: Mild noncompressive disc bulge. T9-10: Mild noncompressive disc bulge. T10-11 through T12-L1: Mild noncompressive disc bulges. MRI LUMBAR SPINE FINDINGS Segmentation:  5 lumbar type vertebral bodies. Alignment: Straightening of the normal lumbar lordosis. Mild curvature convex to the left. Vertebrae: No fracture or primary bone lesion. Discogenic endplate marrow changes on the right at L3-4 and L4-5. Conus medullaris and cauda equina: Conus extends to the L1 level. Conus and cauda equina appear normal. Paraspinal and other soft tissues: Negative Disc levels: L1-2: Previous left hemilaminectomy. Broad-based disc herniation more prominent in the left posterolateral direction. This indents the thecal sac but does not cause visible neural compression. L2-3: Previous left hemilaminectomy. Endplate osteophytes and bulging of the disc. No apparent compressive stenosis. Foraminal to extraforaminal encroachment on the right by osteophyte in disc does not appear to compress the  L2 nerve, but could irritate that structure. L3-4: Endplate osteophytes and bulging of the disc. Mild facet and ligamentous hypertrophy. Mild stenosis of the lateral recesses and foramina but without definite neural compression. L4-5: Endplate osteophytes and broad-based protrusion of the disc. Mild facet and ligamentous hypertrophy. Stenosis of the lateral recesses and neural foramina that could cause neural compression on either or both sides. L5-S1: Mild bulging of the disc.  No canal or foraminal stenosis. IMPRESSION: Cervical region: No cord compression or primary cord lesion. Right paracentral disc herniation at C4-5 with slight upward migration, not apparently resulting in neural compression. Spondylosis at C5-6 and C6-7 with moderate right foraminal narrowing at C5-6 and mild bilateral foraminal narrowing at C6-7. Thoracic region: No fracture. No primary cord lesion. Degenerative spondylosis at multiple levels without apparent neural compression. Left posterolateral disc herniation at T7-8 which does contact the left side of the cord and could be symptomatic. Ample subarachnoid spaces present dorsal to cord however. Lumbar region: No change appreciated since December. Previous left-sided decompression at L1-2 and L2-3. No ongoing left-sided neural compression visible at those levels. L3-4: Mild stenosis of the lateral recesses and foramina but without definite neural compression. L4-5: Bilateral lateral recess and foraminal stenosis because of encroachment by osteophyte in disc material. Neural compression could occur on either side at this level. Electronically Signed   By: Paulina Fusi M.D.   On: 02/18/2019 09:44   Mr Lumbar Spine W Wo Contrast  Result Date: 02/18/2019 CLINICAL DATA:  Back pain and left lower extremity weakness over the last week. EXAM: MRI CERVICAL, THORACIC AND LUMBAR SPINE WITHOUT AND WITH CONTRAST TECHNIQUE: Multiplanar and multiecho pulse sequences of the cervical spine, to  include the craniocervical junction and cervicothoracic junction, and thoracic and lumbar spine, were obtained without and with intravenous contrast. COMPARISON:  CT 02/12/2019.  Lumbar MRI 12/21/2018 CONTRAST:  10 cc Gadavist FINDINGS: MRI CERVICAL SPINE FINDINGS Alignment: Normal Vertebrae: No  fracture or primary bone lesion. Cord: No cord compression or primary cord lesion. Posterior Fossa, vertebral arteries, paraspinal tissues: Negative Disc levels: No abnormality at C1-2, C2-3 or C3-4. C4-5: Right paracentral disc herniation with slight upward migration indents the ventral subarachnoid space but does not compress the cord or show foraminal extension. C5-6: Endplate osteophytes and bulging of the disc more prominent towards the right. No compressive canal stenosis. Right foraminal narrowing of a moderate degree. C6-7: Bulging of the disc and bilateral uncovertebral prominence. No compressive central canal stenosis. Mild bilateral foraminal narrowing. C7-T1: Bulging of the disc.  No canal or foraminal stenosis. MRI THORACIC SPINE FINDINGS Alignment:  No significant malalignment. Vertebrae: No fracture or primary bone lesion. Cord:  No primary cord lesion.  See below regarding stenosis. Paraspinal and other soft tissues: Negative Disc levels: T2-3: Mild noncompressive disc bulge. T4-5: Mild noncompressive disc bulge. T5-6: Shallow left posterolateral herniation without visible neural compression T6-7: Shallow central disc herniation without visible neural compression. T7-8: Central to left-sided disc herniation, contacting the left side of the cord. This could possibly be symptomatic. T8-9: Mild noncompressive disc bulge. T9-10: Mild noncompressive disc bulge. T10-11 through T12-L1: Mild noncompressive disc bulges. MRI LUMBAR SPINE FINDINGS Segmentation:  5 lumbar type vertebral bodies. Alignment: Straightening of the normal lumbar lordosis. Mild curvature convex to the left. Vertebrae: No fracture or primary  bone lesion. Discogenic endplate marrow changes on the right at L3-4 and L4-5. Conus medullaris and cauda equina: Conus extends to the L1 level. Conus and cauda equina appear normal. Paraspinal and other soft tissues: Negative Disc levels: L1-2: Previous left hemilaminectomy. Broad-based disc herniation more prominent in the left posterolateral direction. This indents the thecal sac but does not cause visible neural compression. L2-3: Previous left hemilaminectomy. Endplate osteophytes and bulging of the disc. No apparent compressive stenosis. Foraminal to extraforaminal encroachment on the right by osteophyte in disc does not appear to compress the L2 nerve, but could irritate that structure. L3-4: Endplate osteophytes and bulging of the disc. Mild facet and ligamentous hypertrophy. Mild stenosis of the lateral recesses and foramina but without definite neural compression. L4-5: Endplate osteophytes and broad-based protrusion of the disc. Mild facet and ligamentous hypertrophy. Stenosis of the lateral recesses and neural foramina that could cause neural compression on either or both sides. L5-S1: Mild bulging of the disc.  No canal or foraminal stenosis. IMPRESSION: Cervical region: No cord compression or primary cord lesion. Right paracentral disc herniation at C4-5 with slight upward migration, not apparently resulting in neural compression. Spondylosis at C5-6 and C6-7 with moderate right foraminal narrowing at C5-6 and mild bilateral foraminal narrowing at C6-7. Thoracic region: No fracture. No primary cord lesion. Degenerative spondylosis at multiple levels without apparent neural compression. Left posterolateral disc herniation at T7-8 which does contact the left side of the cord and could be symptomatic. Ample subarachnoid spaces present dorsal to cord however. Lumbar region: No change appreciated since December. Previous left-sided decompression at L1-2 and L2-3. No ongoing left-sided neural compression  visible at those levels. L3-4: Mild stenosis of the lateral recesses and foramina but without definite neural compression. L4-5: Bilateral lateral recess and foraminal stenosis because of encroachment by osteophyte in disc material. Neural compression could occur on either side at this level. Electronically Signed   By: Paulina Fusi M.D.   On: 02/18/2019 09:44   US Carotid Bilateral (at [facility] Only)  Result Date: 02/17/2019 CLINICAL DATA:  Stroke for 12 hours EXAM: BILATERAL CAROTID DUPLEX ULTRASOUND TECHNIQUE:  Gray scale imaging, color Doppler and duplex ultrasound were performed of bilateral carotid and vertebral arteries in the neck. COMPARISON:  08/02/2016 FINDINGS: Criteria: Quantification of carotid stenosis is based on velocity parameters that correlate the residual internal carotid diameter with NASCET-based stenosis levels, using the diameter of the distal internal carotid lumen as the denominator for stenosis measurement. The following velocity measurements were obtained: RIGHT ICA: 101 cm/sec CCA: 138 cm/sec SYSTOLIC ICA/CCA RATIO:  0.7 ECA: 120 cm/sec LEFT ICA: 72 cm/sec CCA: 67 cm/sec SYSTOLIC ICA/CCA RATIO:  1.1 ECA: 141 cm/sec RIGHT CAROTID ARTERY: Mild irregular calcified plaque in the bulb. Low resistance internal carotid Doppler pattern is preserved. RIGHT VERTEBRAL ARTERY:  Antegrade. LEFT CAROTID ARTERY: Moderate predominately smooth calcified plaque in the bulb. Low resistance internal carotid Doppler pattern is preserved. LEFT VERTEBRAL ARTERY:  Antegrade. IMPRESSION: Less than 50% stenosis in the right and left internal carotid arteries. Antegrade vertebral arteries bilaterally. Electronically Signed   By: Jolaine Click M.D.   On: 02/17/2019 16:01   Dg Chest Portable 1 View  Result Date: 02/12/2019 CLINICAL DATA:  Status post fall this morning getting out of bed. EXAM: PORTABLE CHEST 1 VIEW COMPARISON:  PA and lateral chest 02/08/2019 and 02/28/2018. FINDINGS: The patient is status  post CABG. Heart size is upper normal. Mild discoid atelectasis left lung base noted. Lungs otherwise clear. No pneumothorax or pleural effusion. No acute bony abnormality. There is partial visualization of fixation of a right humerus fracture and rotator cuff repair on the right. IMPRESSION: No acute disease. Electronically Signed   By: Drusilla Kanner M.D.   On: 02/12/2019 08:53   [name] Wo Contrast  Result Date: 02/17/2019 CLINICAL DATA:  Left-sided weakness and fall. EXAM: MRI HEAD WITHOUT CONTRAST MRA HEAD WITHOUT CONTRAST TECHNIQUE: Multiplanar, multiecho pulse sequences of the brain and surrounding structures were obtained without intravenous contrast. Angiographic images of the head were obtained using MRA technique without contrast. COMPARISON:  MRA brain 11/02/2018 Head CT 02/17/2019 Brain MRI 02/08/2019 FINDINGS: MRI HEAD FINDINGS BRAIN: There is no acute infarct, acute hemorrhage or mass effect. The midline structures are normal. Old right thalamic lacunar infarct. Early confluent hyperintense T2-weighted signal of the periventricular and deep white matter, most commonly due to chronic ischemic microangiopathy. Generalized atrophy without lobar predilection. Susceptibility-sensitive sequences show no chronic microhemorrhage or superficial siderosis. SKULL AND UPPER CERVICAL SPINE: The visualized skull base, calvarium, upper cervical spine and extracranial soft tissues are normal. SINUSES/ORBITS: No fluid levels or advanced mucosal thickening. No mastoid or middle ear effusion. The orbits are normal. MRA HEAD FINDINGS POSTERIOR CIRCULATION: --Basilar artery: Normal. --Posterior cerebral arteries: Multifocal severe stenosis of both P2 segments. The right PCA is predominantly supplied by the posterior communicating artery. --Superior cerebellar arteries: Normal. --Inferior cerebellar arteries: Normal anterior and posterior inferior cerebellar arteries. ANTERIOR CIRCULATION: --Intracranial internal  carotid arteries: Normal. --Anterior cerebral arteries: Normal. Hypoplastic right A1 segment, normal variant. --Middle cerebral arteries: There are multiple moderate-to-severe stenoses of the proximal right M2 branches. There is severe stenosis of the distal left M1 and proximal M2 branches. The left M1 stenosis is new compared to 11/02/18. --Posterior communicating arteries: Right p-comm gives rise to the right PCA. No left p-comm is visualized. IMPRESSION: 1. No acute ischemic infarct. 2. Chronic ischemic microangiopathy and generalized atrophy. 3. New, severe stenosis of the distal left M1 MCA segment. Unchanged bilateral MCA M2 range moderate-to-severe stenoses. 4. Unchanged multifocal severe stenoses of both posterior cerebral artery P2 segments. Electronically Signed   By:  Deatra RobinsonKevin  Herman M.D.   On: 02/17/2019 15:38   Ct Head Code Stroke Wo Contrast  Result Date: 02/17/2019 CLINICAL DATA:  Code stroke. Fall today. New onset left lower extremity weakness. EXAM: CT HEAD WITHOUT CONTRAST TECHNIQUE: Contiguous axial images were obtained from the base of the skull through the vertex without intravenous contrast. COMPARISON:  CT head without contrast 02/12/2019. MRI brain 02/08/2019 FINDINGS: Brain: Moderate atrophy and white matter disease is stable. Basal ganglia are intact. Insular ribbon is normal bilaterally. No acute or focal cortical abnormalities are present. The ventricles are of proportionate to the degree of atrophy. No significant extraaxial fluid collection is present. The brainstem and cerebellum are within normal limits. Vascular: Atherosclerotic calcifications are again noted within the cavernous internal carotid arteries. There is no hyperdense vessel. Skull: Calvarium is intact. No focal lytic or blastic lesions are present. Sinuses/Orbits: Chronic right sphenoid sinus disease is present. The paranasal sinuses are otherwise clear. Bilateral lens replacements are noted. Globes and orbits are  otherwise unremarkable. ASPECTS [facility]([place] Stroke Program Early CT Score) - Ganglionic level infarction (caudate, lentiform nuclei, internal capsule, insula, M1-M3 cortex): 7/7 - Supraganglionic infarction (M4-M6 cortex): 3/3 Total score (0-10 with 10 being normal): 10/10 IMPRESSION: 1. No acute intracranial abnormality or significant interval change. 2. Stable moderate atrophy and white matter disease. 3. ASPECTS is 10/10 These results were called by telephone at the time of interpretation on 02/17/2019 at 1:28 pm to Dr. Durward MallardJEHANZEB [facility] , who verbally acknowledged these results. Electronically Signed   By: Marin Robertshristopher  Mattern M.D.   On: 02/17/2019 13:28    Micro Results    Recent Results (from the past 240 hour(s))  Urine Culture     Status: Abnormal (Preliminary result)   Collection Time: 02/24/19  3:26 PM  Result Value Ref Range Status   Specimen Description   Final    URINE, CATHETERIZED Performed at [facility], [address] [place], [place] [address]    Special Requests   Final    Normal Performed at [facility], [address] [place], [place] [address]    Culture (A)  Final    >=100,000 COLONIES/mL ESCHERICHIA COLI SUSCEPTIBILITIES TO FOLLOW Performed at [facility], [address] [place], [place] [address]    Report Status PENDING  Incomplete       Today   Subjective    [name] [name] today has no new complaints, dysuria is improving, no vomiting no diarrhea          Patient has been seen and examined prior to discharge   Objective   Blood pressure 135/80, pulse 71, temperature (!) 97.5 F (36.4 C), resp. rate 18, height 5\' 4"  (1.626 m), weight 91.8 kg, SpO2 95 %.   Intake/Output Summary (Last 24 hours) at 02/26/2019 1349 Last data filed at 02/26/2019 1053 Gross per 24 hour  Intake 578.69 ml  Output 700 ml  Net -121.31 ml   Exam Gen:- Awake Alert,  In no apparent distress  HEENT:- [place].AT, No sclera icterus Neck-Supple Neck,No JVD,.  Lungs-  CTAB ,  fair symmetrical air movement CV- S1, S2 normal, regular  Abd-  +ve B.Sounds, Abd Soft, No tenderness, No CVA Tenderness   Extremity/Skin:- No  edema, pedal pulses present  Psych-affect is appropriate, oriented x3 Neuro-generalized weakness, but no new focal deficits, no tremors   Data Review   CBC w Diff:  Lab Results  Component Value Date   WBC 14.3 (H) 02/23/2019   HGB 12.2 02/23/2019  HCT 42.0 02/23/2019   PLT 321 02/23/2019   LYMPHOPCT 26 02/23/2019   MONOPCT 8 02/23/2019   EOSPCT 1 02/23/2019   BASOPCT 0 02/23/2019    CMP:  Lab Results  Component Value Date   NA 137 02/23/2019   K 3.9 02/23/2019   CL 102 02/23/2019   CO2 28 02/23/2019   BUN 21 02/23/2019   CREATININE 0.70 02/23/2019   PROT 6.4 (L) 02/17/2019   ALBUMIN 3.4 (L) 02/17/2019   BILITOT 0.4 02/17/2019   ALKPHOS 64 02/17/2019   AST 16 02/17/2019   ALT 18 02/17/2019  .   Total Discharge time is about 33 minutes  Shon Hale M.D on 02/26/2019 at 1:49 PM  Go to [email or web address] -  for contact info  [facility] - Office  [phone]

## 2019-02-26 NOTE — Progress Notes (Signed)
Physical Therapy Treatment Patient Details Name: [name] [name] MRN: [id number] DOB: [birth year] Today's Date: 02/26/2019    History of Present Illness [name] [name] is a 83 y.o. female with medical history significant for hypertension, insulin-dependent diabetes mellitus, history of CVA, chronic back pain, generalized weakness and debility, hypothyroidism, COPD, and OSA, now presenting to the emergency department after a fall at home.  The patient had just been discharged from the hospital several hours earlier after admission for weakness and debility.  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  Family was assisting her with ambulation this evening when she began to fall.  She was eased to the ground, did not hit her head, and denies any injury from this.  She denies any acute weakness, or any change in her condition since leaving the hospital, but reports that she is too weak to get up on her own and family was not strong enough to help her up either.  They called EMS and she was brought back to the ED.  Patient denies fevers, chills, headache, acute numbness or weakness, chest pain, palpitations, cough, or any change in her chronic exertional dyspnea.  Dysuria has improved and she     PT Comments    Patient lying in bed when arrived. Patient agreeable to participating in therapy today. Min to mod assist for supine to sit. Patient able to sit at EOB with Rt UE support and intermittent min assist as lost balance to right and posterior but better than yesterday. Patient continues with no active dorsiflexion of the left ankle. Patient transferred sit to stand with min to mod assist and stand pivot with 3-4 steps to chair with RW and mod to max assist. Assistance to unload Rt and Lt LE  to move feet; Lt knee remained in extension throughout.  Patient would continue to benefit from skilled physical therapy in current environment and next venue to continue return to prior function and increase strength, endurance, balance, coordination, and functional mobility and gait skills.     Follow Up Recommendations  SNF     Equipment Recommendations  None recommended by PT    Recommendations for Other Services       Precautions / Restrictions Precautions Precautions: Fall Restrictions Weight Bearing Restrictions: No    Mobility  Bed Mobility Overal bed mobility: Needs Assistance Bed Mobility: Supine to Sit     Supine to sit: Min assist;Mod assist     General bed mobility comments: slow labored movement; posterior lean to Rt, required R UE to maintain upright position and min A intermittently for sitting balance  Transfers Overall transfer level: Needs assistance Equipment used: Rolling walker (2 wheeled) Transfers: Sit to/from [employer] Sit to Stand: Mod assist Stand pivot transfers: Mod assist;Max assist       General transfer comment: slow labored movement, diffiuclty secondary to LLE weakness, required assistance for Lt LE movements;   Ambulation/Gait Ambulation/Gait assistance: Mod assist;Max assist Gait Distance (Feet): 3 Feet Assistive device: Rolling walker (2 wheeled) Gait Pattern/deviations: Decreased step length - right;Decreased step length - left;Decreased stance time - left;Decreased stride length;Step-to pattern;Wide base of support Gait velocity: decreased   General Gait Details: limited to 3-4 slow unsteady labored steps with diffiuclty advancing LLE due to LLE weakness and lack  of dorsiflexion, L knee held in knee extension throughout; overt LOB requiring assistance to recover   Stairs             Wheelchair Mobility    Modified Rankin (Stroke Patients Only)       Balance Overall balance assessment: Needs  assistance Sitting-balance support: Feet supported;No upper extremity supported Sitting balance-Leahy Scale: Fair     Standing balance support: Bilateral upper extremity supported;During functional activity Standing balance-Leahy Scale: Poor Standing balance comment: w/ RW                            Cognition Arousal/Alertness: Awake/alert Behavior During Therapy: WFL for tasks assessed/performed Overall Cognitive Status: Within Functional Limits for tasks assessed                                        Exercises General Exercises - Upper Extremity Shoulder Flexion: AROM;Strengthening;Both;10 reps;Seated General Exercises - Lower Extremity Long Arc Quad: Seated;AROM;Strengthening;Both;10 reps Hip Flexion/Marching: Seated;AROM;Strengthening;Both;10 reps Toe Raises: Seated;AROM;Strengthening;Right;10 reps(L dorsiflexion palpated, but no movement) Heel Raises: Seated;AROM;Strengthening;Right;10 reps(L plantarflexion palpated, but no movement)    General Comments        Pertinent Vitals/Pain Pain Assessment: No/denies pain    Home Living                      Prior Function            PT Goals (current goals can now be found in the care plan section) Acute Rehab PT Goals Patient Stated Goal: return home with family to assist PT Goal Formulation: With patient Time For Goal Achievement: 03/10/19 Potential to Achieve Goals: Good Progress towards PT goals: Progressing toward goals    Frequency    Min 3X/week      PT Plan Current plan remains appropriate    Co-evaluation              AM-PAC PT "6 Clicks" Mobility   Outcome Measure  Help needed turning from your back to your side while in a flat bed without using bedrails?: A Little Help needed moving from lying on your back to sitting on the side of a flat bed without using bedrails?: A Lot Help needed moving to and from a bed to a chair (including a wheelchair)?: A  Lot Help needed standing up from a chair using your arms (e.g., wheelchair or bedside chair)?: A Lot Help needed to walk in hospital room?: A Lot Help needed climbing 3-5 steps with a railing? : Total 6 Click Score: 12    End of Session Equipment Utilized During Treatment: Gait belt Activity Tolerance: Patient tolerated treatment well;Patient limited by fatigue Patient left: in chair;with call bell/phone within reach(nursing notified) Nurse Communication: Mobility status(Aids asked to replace pure wick) PT Visit Diagnosis: Unsteadiness on feet (R26.81);Muscle weakness (generalized) (M62.81);Other abnormalities of gait and mobility (R26.89)     Time: 6440-3474 PT Time Calculation (min) (ACUTE ONLY): 30 min  Charges:  $Therapeutic Exercise: 8-22 mins $Therapeutic Activity: 8-22 mins                     Katina Dung. Hartnett-Rands, MS, PT [place] [facility] [phone] 02/26/2019, 10:51 AM

## 2019-02-26 NOTE — Progress Notes (Signed)
Report called and given to Moise Boring, [facility]. All questions were answered and no further questions at this time. Pt in stable condition and in no acute distress at time of discharge. Pt escorted by nurse tech to [facility].

## 2019-02-26 NOTE — Discharge Instructions (Signed)
1) please give fosfomycin antibiotic 3 g orally x1 dose around 10 AM on 03/01/2019 2) NPH insulin has been reduced to 30 units twice a day, consider additional sliding scale use if blood sugars running high

## 2019-02-27 LAB — URINE CULTURE
Culture: >=100000 — AB
Special Requests: NORMAL

## 2019-03-01 ENCOUNTER — Other Ambulatory Visit: Payer: Self-pay | Admitting: Adult Health

## 2019-03-01 ENCOUNTER — Encounter: Payer: Self-pay | Admitting: Internal Medicine

## 2019-03-01 ENCOUNTER — Non-Acute Institutional Stay (SKILLED_NURSING_FACILITY): Payer: Medicare HMO | Admitting: Internal Medicine

## 2019-03-01 DIAGNOSIS — J410 Simple chronic bronchitis: Secondary | ICD-10-CM

## 2019-03-01 DIAGNOSIS — G4733 Obstructive sleep apnea (adult) (pediatric): Secondary | ICD-10-CM

## 2019-03-01 DIAGNOSIS — E118 Type 2 diabetes mellitus with unspecified complications: Secondary | ICD-10-CM

## 2019-03-01 DIAGNOSIS — R2681 Unsteadiness on feet: Secondary | ICD-10-CM

## 2019-03-01 DIAGNOSIS — D638 Anemia in other chronic diseases classified elsewhere: Secondary | ICD-10-CM

## 2019-03-01 DIAGNOSIS — N390 Urinary tract infection, site not specified: Secondary | ICD-10-CM | POA: Diagnosis not present

## 2019-03-01 DIAGNOSIS — E7801 Familial hypercholesterolemia: Secondary | ICD-10-CM

## 2019-03-01 DIAGNOSIS — R531 Weakness: Secondary | ICD-10-CM

## 2019-03-01 DIAGNOSIS — E02 Subclinical iodine-deficiency hypothyroidism: Secondary | ICD-10-CM

## 2019-03-01 MED ORDER — OXYCODONE HCL 5 MG PO TABS: 5.0000 mg | ORAL_TABLET | ORAL | 0 refills | Status: DC | PRN

## 2019-03-01 MED ORDER — LORAZEPAM 0.5 MG PO TABS: 0.5000 mg | ORAL_TABLET | Freq: Two times a day (BID) | ORAL | 0 refills | Status: DC | PRN

## 2019-03-01 NOTE — Progress Notes (Signed)
Provider:  Einar Crow, MD Location:  [facility] Room Number: 57 D Place of Service:  SNF (31)  PCP: Oval Linsey, MD Patient Care Team: Oval Linsey, MD as PCP - General (Internal Medicine) Rothbart, Gerrit Friends, MD (Cardiology) Roma Kayser, MD (Endocrinology)  Extended Emergency Contact Information Primary Emergency Contact: ?, [facility] Phone: [phone] Relation: Granddaughter Secondary Emergency Contact: Beezley,Burl Address: [address]          [place], [place] [address] [place] Home Phone: [phone] Relation: Spouse  Code Status: Full Code Goals of Care: Advanced Directive information Advanced Directives 03/01/2019  Does Patient Have a Medical Advance Directive? No  Would patient like information on creating a medical advance directive? No - Patient declined  Pre-existing out of facility DNR order (yellow form or pink MOST form) -      Chief Complaint  Patient presents with  . New Admit To SNF    Admission    HPI: Patient is a 83 y.o. female seen today for admission to SNF for therapy  Patient was admitted in the hospital initially 02/21-03/02 for Fall and LE weakness. She was discharged home and came back as she was till falling and her family was unable to take care of her at home.  Patient has PMH of Hypertension, Diabetes Mellitus, h/o CVA in 09/19 and 2013, hypothyroidism, COPD, OSA on CPAP, Hyperlipidemia, Anemia, Chronic Back pain  Recurrent UTI Patient was initially admitted and had extensive work up for her Left LE weakness by Neurology including MRI of her head and different levels of spine. Everything was negative for any acute process.  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  She was also treated with dual antiplatelet therapy for intracranial stenosis.  Since she could not be approved for SNF she was sent home with her family. Patient  husband who was  her primary caregiver had fallen at home and is also admitted in our facility.  Patient's daughter was unable to take care of her and patient states she was too weak to walk.  She fell at home and her family brought her to ED again.  In ED her blood sugar was 54.  Her NPH was reduced to 30 units twice daily. She was also treated for UTI and Yeast    Patient is now here in SNF for therapy. Patient denied any cough, fever, abdominal pain and nausea vomiting.   She lives her son and her husband.  She states she used to walk with a walker at home in needed some help with her ADLs.    Past Medical History:  Diagnosis Date  . Arthritis   . CAD (coronary artery disease)    Multivessel status post CABG 2001  . Chronic obstructive pulmonary disease (HCC)   . Chronic pain   . DDD (degenerative disc disease), lumbar   . Diastolic CHF (HCC)   . Essential hypertension   . Hematuria   . History of CVA (cerebrovascular accident)    2013 - lacunar infarct right thalamus - residual left side of  lip numb  and left eye vision worse (which corrented lens implant from cataract extraction)  . History of MI (myocardial infarction)    July 2001  . Hyperlipidemia   . Hypothyroidism   . Insulin dependent type 2 diabetes mellitus (HCC)   . Lesion of bladder   . Lower urinary tract symptoms (LUTS)   . OSA (obstructive  sleep apnea)   . Peripheral neuropathy   . Psoriasis   . SUI (stress urinary incontinence, female)   . Varicose veins    Past Surgical History:  Procedure Laterality Date  . APPENDECTOMY  age 58  . CARDIAC CATHETERIZATION  1991   No sig. cad  . CARDIAC CATHETERIZATION  07-01-2000   high grade in-stent restenosis  . CARDIAC CATHETERIZATION  04-30-2001   single native vessel cad/ graft patent  . CARDIAC CATHETERIZATION  02-13-2006  dr [place] cad with total occluded LAD  and  70% ostial of small ramus/  grafts patent/  normal ef  . CARDIAC CATHETERIZATION   01-20-2000  dr Clifton James   essentially unchanged;  severe single vessel cad with diffuse disease throughout CFX and RCA/ ef 66%;  chf with preserved LVSF  . CARDIOVASCULAR STRESS TEST  08-28-2011   DR ROTHBART   NEGATIVE NUCLEAR STUDY/  GLOBAL LVSF/  EF 65%  . CARPAL TUNNEL RELEASE Bilateral yrs ago  . CATARACT EXTRACTION W/ INTRAOCULAR LENS  IMPLANT, BILATERAL  2013  . CHOLECYSTECTOMY  1990  . COLONOSCOPY N/A 07/26/2015   Procedure: COLONOSCOPY;  Surgeon: Malissa Hippo, MD;  [facility]: [facility];  Service: Endoscopy;  Laterality: N/A;  1200  . CORONARY ANGIOPLASTY  11-13-1999   balloon angioplasty to mLAD , d2 of LAD  . CORONARY ANGIOPLASTY WITH STENT PLACEMENT  01-17-2000   PCI stenting to mLAD & D2 of LAD  . CORONARY ARTERY BYPASS GRAFT  07-02-2000   DR Tressie Stalker   SVG to diagonal, LIMA to LAD  . CYSTOSCOPY W/ RETROGRADES Bilateral 08/09/2014   Procedure: CYSTOSCOPY, BILATERAL RETROGRADE, HYDRODISTENSION, BLADDER BIOPSY WITH FULGERATION, INSTILL PYRIDIUM AND MARCAINE ;  Surgeon: Anner Crete, MD;  Location: [facility];  Service: Urology;  Laterality: Bilateral;  . KNEE ARTHROSCOPY Left 10-18-2003   SYNOVECTOMY  . LUMBAR SPINE SURGERY  x2    yrs ago  . ORIF HUMERUS FRACTURE Right 04-18-2008  . REPAIR FLEXOR TENDON HAND Right 09-20-2008   CARPI RADIALIS TENDON TRANSFER TO EXTENSOR TO INDEX, MIDDLE, RING , LITTLE FINGERS AND DIGITI MININI  . ROTATOR CUFF REPAIR Right 1995  . TOTAL ABDOMINAL HYSTERECTOMY W/ BILATERAL SALPINGOOPHORECTOMY  age 57  . TRANSTHORACIC ECHOCARDIOGRAM  12-12-2013   MILD LVH/  EF 60-65%/  GRADE I DIASTOLIC DYSFUNCTION/  MILD LAE    reports that she has never smoked. She has never used smokeless tobacco. She reports that she does not drink alcohol or use drugs. Social History   Socioeconomic History  . Marital status: Married    Spouse name: Not on file  . Number of children: 2  . Years of education: Not on file  . Highest education  level: Not on file  Occupational History    Employer: RETIRED  Social Needs  . Financial resource strain: Patient refused  . Food insecurity:    Worry: Patient refused    Inability: Patient refused  . Transportation needs:    Medical: Patient refused    Non-medical: Patient refused  Tobacco Use  . Smoking status: Never Smoker  . Smokeless tobacco: Never Used  Substance and Sexual Activity  . Alcohol use: No  . Drug use: No  . Sexual activity: Never  Lifestyle  . Physical activity:    Days per week: Patient refused    Minutes per session: Patient refused  . Stress: Patient refused  Relationships  . Social connections:    Talks on phone: Patient refused  Gets together: Patient refused    Attends religious service: Patient refused    Active member of club or organization: Patient refused    Attends meetings of clubs or organizations: Patient refused    Relationship status: Patient refused  . Intimate partner violence:    Fear of current or ex partner: Patient refused    Emotionally abused: Patient refused    Physically abused: Patient refused    Forced sexual activity: Patient refused  Other Topics Concern  . Not on file  Social History Narrative  . Not on file    Functional Status Survey:    Family History  Problem Relation Age of Onset  . Hypertension Mother   . Stroke Mother   . Coronary artery disease Other        Multiple first and second-degree relatives  . Aneurysm Other        Cerebral circulation  . Colon cancer Sister   . Stroke Sister   . Coronary artery disease Sister   . Clotting disorder Other        Children diagnosed with hypercoagulable state  . Coronary artery disease Brother     Health Maintenance  Topic Date Due  . FOOT EXAM  04/01/2019 (Originally 09/15/1946)  . OPHTHALMOLOGY EXAM  04/01/2019 (Originally 09/15/1946)  . PNA vac Low Risk Adult (1 of 2 - PCV13) 04/01/2019 (Originally 09/16/2003)  . HEMOGLOBIN A1C  08/19/2019  .  TETANUS/TDAP  08/23/2028  . INFLUENZA VACCINE  Completed  . DEXA SCAN  Completed    Allergies  Allergen Reactions  . Codeine Itching    Outpatient Encounter Medications as of 03/01/2019  Medication Sig  . acetaminophen (TYLENOL) 325 MG tablet Take 2 tablets (650 mg total) by mouth every 6 (six) hours as needed for mild pain or headache (or Fever >/= 101).  [name] amLODipine (NORVASC) 5 MG tablet Take 5 mg by mouth daily.  [name] aspirin EC 81 MG tablet Take 1 tablet (81 mg total) by mouth daily.  . benzonatate (TESSALON) 200 MG capsule Take 1 capsule (200 mg total) by mouth 3 (three) times daily as needed for cough.  . citalopram (CELEXA) 20 MG tablet Take 1 tablet (20 mg total) by mouth daily.  . clopidogrel (PLAVIX) 75 MG tablet Take 1 tablet (75 mg total) by mouth daily for 30 days.  . fluconazole (DIFLUCAN) 200 MG tablet Take 1 tablet (200 mg total) by mouth daily for 3 days.  [name] gabapentin (NEURONTIN) 300 MG capsule Take 300 mg by mouth 2 (two) times daily.  . insulin NPH-regular Human (70-30) 100 UNIT/ML injection Inject 30 Units into the skin 2 (two) times daily.  [name] ipratropium-albuterol (DUONEB) 0.5-2.5 (3) MG/3ML SOLN Inhale 3 mLs into the lungs 4 (four) times daily as needed (for shortness of breath).   [name] levothyroxine (SYNTHROID, LEVOTHROID) 25 MCG tablet Take 25 mcg by mouth daily.   [name] lisinopril (PRINIVIL,ZESTRIL) 10 MG tablet Take 10 mg by mouth daily.   [name] LORazepam (ATIVAN) 0.5 MG tablet Take 1 tablet (0.5 mg total) by mouth every 12 (twelve) hours as needed for up to 14 days for anxiety or sleep.  . metFORMIN (GLUCOPHAGE) 500 MG tablet Take 500 mg by mouth 2 (two) times daily with a meal.    . nitroGLYCERIN (NITROSTAT) 0.4 MG SL tablet Place 0.4 mg under the tongue every 5 (five) minutes as needed for chest pain.   . NON FORMULARY Diet Type:  NAS, Consistent Carbohydrate  . oxyCODONE (OXY IR/ROXICODONE) 5  MG immediate release tablet Take 1 tablet (5 mg total) by mouth every 4 (four)  hours as needed for up to 5 days (Moderate pain).  . pantoprazole (PROTONIX) 40 MG tablet Take 1 tablet (40 mg total) by mouth daily at 6 (six) AM.  . simvastatin (ZOCOR) 20 MG tablet Take 20 mg by mouth daily.  [name] UNABLE TO FIND CPAP from home and previous home settings while sleeping   No facility-administered encounter medications on file as of 03/01/2019.     Review of Systems  Constitutional: Positive for activity change.  HENT: Negative.   Respiratory: Negative.   Cardiovascular: Negative.   Gastrointestinal: Negative.   Genitourinary: Negative.   Musculoskeletal: Positive for back pain.  Skin: Negative.   Neurological: Positive for weakness.  Psychiatric/Behavioral: Positive for dysphoric mood.    Vitals:   03/01/19 1117  BP: 133/76  Pulse: 72  Resp: 17  Temp: 97.6 F (36.4 C)  SpO2: 94%  Weight: 199 lb 9.6 oz (90.5 kg)  Height: 5' 4.5" (1.638 m)   Body mass index is 33.73 kg/m. Physical Exam Vitals signs reviewed.  Constitutional:      Appearance: Normal appearance.  HENT:     Head: Normocephalic.     Nose: Nose normal.     Mouth/Throat:     Mouth: Mucous membranes are moist.     Pharynx: Oropharynx is clear.  Eyes:     Pupils: Pupils are equal, round, and reactive to light.  Neck:     Musculoskeletal: Neck supple.  Cardiovascular:     Rate and Rhythm: Normal rate and regular rhythm.     Pulses: Normal pulses.     Heart sounds: Normal heart sounds. No murmur.  Pulmonary:     Effort: Pulmonary effort is normal.     Breath sounds: Normal breath sounds.  Abdominal:     General: Abdomen is flat. Bowel sounds are normal.     Palpations: Abdomen is soft.  Musculoskeletal:        General: No swelling.  Skin:    General: Skin is warm and dry.  Neurological:     Mental Status: She is alert and oriented to person, place, and time.     Comments: Had Left leg Weakness 3/-4/5 Good strength in all other extremeties  Psychiatric:     Comments: Patient is  depressed as her husband in next room is not doing well     Labs reviewed: Basic Metabolic Panel: Recent Labs    06/11/18 1409  02/13/19 0645  02/20/19 0649 02/22/19 2129 02/23/19 1510  NA  --    < > 137   < > 139 137 137  K  --    < > 4.2   < > 4.0 4.2 3.9  CL  --    < > 104   < > 108 102 102  CO2  --    < > 25   < > GLUCOSE  --    < > 211*   < > 279* 54* 235*  BUN  --    < > 12   < > 19 28* 21  CREATININE  --    < > 0.72   < > 0.70 0.79 0.70  CALCIUM  --    < > 8.9   < > 9.0 9.3 8.5*  MG 1.9  --  1.8  --   --   --   --    < > =  values in this interval not displayed.   Liver Function Tests: Recent Labs    02/12/19 0859 02/13/19 0645 02/17/19 1313  AST 14* 12* 16  ALT 13 11 18   ALKPHOS 60 58 64  BILITOT 0.3 0.6 0.4  PROT 6.7 6.0* 6.4*  ALBUMIN 3.7 3.2* 3.4*   No results for input(s): LIPASE, AMYLASE in the last 8760 hours. No results for input(s): AMMONIA in the last 8760 hours. CBC: Recent Labs    02/13/19 0645 02/17/19 1313 02/22/19 2129 02/23/19 1510  WBC 11.4* 9.9 13.7* 14.3*  NEUTROABS 7.8*  --  8.4* 9.1*  HGB 11.5* 11.7* 12.7 12.2  HCT 39.9 40.6 43.2 42.0  MCV 81.6 81.2 80.7 80.3  PLT 276 308 369 321   Cardiac Enzymes: Recent Labs    01/03/19 2103 02/08/19 1740 02/12/19 0859  TROPONINI <0.03 <0.03 <0.03   BNP: Invalid input(s): POCBNP Lab Results  Component Value Date   HGBA1C 8.5 (H) 02/18/2019   Lab Results  Component Value Date   TSH 0.959 02/12/2019   Lab Results  Component Value Date   VITAMINB12 471 09/30/2017   Lab Results  Component Value Date   FOLATE 15.5 09/30/2017   Lab Results  Component Value Date   IRON 14 (L) 09/30/2017   TIBC 354 09/30/2017   FERRITIN 8 (L) 09/30/2017    Imaging and Procedures obtained prior to SNF admission: No results found.  Assessment/Plan DM type 2  With Hypoglycemia Her BS was 54 in the ED She has some sugars more then 200 in facility Will continue same dose of 70/30  for now Will continue to monitor Avoid Hypoglycemia though her A1C was elevated in hospital  Acute lower UTI Was treated with Fosamycin Left LE Weakness All imaging are negative.Treated as Diabetic Amylotrophy Got high dose steroids in previous admission per Neurology Will start her on therapy and reeval  Hyperlipidemia On statin  Gait instability Will work with therapy H/O CVA Repeat Mri did not show any new Storkes She is on aspirin and Plavix. On Statin. BP controlled Anemia of chronic disease Will repeat CBC and get iron Studies  Hypothyroidism TSH was normal  COPD Not any bronchodilators Was on Chronic Prednisone before Will continue to monitor  OSA (obstructive sleep apnea) CPAP in Facility Chronic back pain On Oxycodone decrease to Q6 hours Counseled patient to avoid Narcotics especially with these falls On Neurontin Depression On Citaloprim    Family/ staff Communication:   Labs/tests ordered: Total time spent in this patient care encounter was 45_ minutes; greater than 50% of the visit spent counseling patient, reviewing records , Labs and coordinating care for problems addressed at this encounter.

## 2019-03-02 ENCOUNTER — Non-Acute Institutional Stay (SKILLED_NURSING_FACILITY): Payer: Medicare HMO | Admitting: Adult Health

## 2019-03-02 ENCOUNTER — Encounter: Payer: Self-pay | Admitting: Adult Health

## 2019-03-02 ENCOUNTER — Other Ambulatory Visit: Payer: Self-pay | Admitting: Adult Health

## 2019-03-02 DIAGNOSIS — E02 Subclinical iodine-deficiency hypothyroidism: Secondary | ICD-10-CM

## 2019-03-02 DIAGNOSIS — F339 Major depressive disorder, recurrent, unspecified: Secondary | ICD-10-CM

## 2019-03-02 DIAGNOSIS — E1165 Type 2 diabetes mellitus with hyperglycemia: Secondary | ICD-10-CM

## 2019-03-02 DIAGNOSIS — E1169 Type 2 diabetes mellitus with other specified complication: Secondary | ICD-10-CM

## 2019-03-02 DIAGNOSIS — E1142 Type 2 diabetes mellitus with diabetic polyneuropathy: Secondary | ICD-10-CM

## 2019-03-02 DIAGNOSIS — I25708 Atherosclerosis of coronary artery bypass graft(s), unspecified, with other forms of angina pectoris: Secondary | ICD-10-CM | POA: Diagnosis not present

## 2019-03-02 DIAGNOSIS — K219 Gastro-esophageal reflux disease without esophagitis: Secondary | ICD-10-CM

## 2019-03-02 DIAGNOSIS — E1159 Type 2 diabetes mellitus with other circulatory complications: Secondary | ICD-10-CM | POA: Diagnosis not present

## 2019-03-02 DIAGNOSIS — I10 Essential (primary) hypertension: Secondary | ICD-10-CM

## 2019-03-02 DIAGNOSIS — G4733 Obstructive sleep apnea (adult) (pediatric): Secondary | ICD-10-CM

## 2019-03-02 DIAGNOSIS — E785 Hyperlipidemia, unspecified: Secondary | ICD-10-CM

## 2019-03-02 DIAGNOSIS — J410 Simple chronic bronchitis: Secondary | ICD-10-CM | POA: Diagnosis not present

## 2019-03-02 DIAGNOSIS — M5440 Lumbago with sciatica, unspecified side: Secondary | ICD-10-CM

## 2019-03-02 DIAGNOSIS — G8929 Other chronic pain: Secondary | ICD-10-CM

## 2019-03-02 DIAGNOSIS — IMO0002 Reserved for concepts with insufficient information to code with codable children: Secondary | ICD-10-CM

## 2019-03-02 MED ORDER — OXYCODONE HCL 5 MG PO TABS: 5.0000 mg | ORAL_TABLET | Freq: Four times a day (QID) | ORAL | 0 refills | Status: DC | PRN

## 2019-03-02 NOTE — Progress Notes (Signed)
Location:   [facility] Room Number: 152 D Place of Service:  SNF (31)   CODE STATUS: Full Code  Allergies  Allergen Reactions  . Codeine Itching    Chief Complaint  Patient presents with  . Acute Visit    Edema in lower extremity    HPI:  She hs some mild right ankle edema; no right lower extremity edema. She is on oxycodone as needed for chronic back pain. She denies any uncontrolled pain; no chest pain nor shortness of breath.   Past Medical History:  Diagnosis Date  . Arthritis   . CAD (coronary artery disease)    Multivessel status post CABG 2001  . Chronic obstructive pulmonary disease (HCC)   . Chronic pain   . DDD (degenerative disc disease), lumbar   . Diastolic CHF (HCC)   . Essential hypertension   . Hematuria   . History of CVA (cerebrovascular accident)    2013 - lacunar infarct right thalamus - residual left side of  lip numb  and left eye vision worse (which corrented lens implant from cataract extraction)  . History of MI (myocardial infarction)    July 2001  . Hyperlipidemia   . Hypothyroidism   . Insulin dependent type 2 diabetes mellitus (HCC)   . Lesion of bladder   . Lower urinary tract symptoms (LUTS)   . OSA (obstructive sleep apnea)   . Peripheral neuropathy   . Psoriasis   . SUI (stress urinary incontinence, female)   . Varicose veins     Past Surgical History:  Procedure Laterality Date  . APPENDECTOMY  age 45  . CARDIAC CATHETERIZATION  1991   No sig. cad  . CARDIAC CATHETERIZATION  07-01-2000   high grade in-stent restenosis  . CARDIAC CATHETERIZATION  04-30-2001   single native vessel cad/ graft patent  . CARDIAC CATHETERIZATION  02-13-2006  dr Jenness Corner cad with total occluded LAD  and  70% ostial of small ramus/  grafts patent/  normal ef  . CARDIAC CATHETERIZATION  01-20-2000  dr Clifton James   essentially unchanged;  severe single vessel cad with diffuse disease throughout CFX and RCA/ ef 66%;  chf  with preserved LVSF  . CARDIOVASCULAR STRESS TEST  08-28-2011   DR ROTHBART   NEGATIVE NUCLEAR STUDY/  GLOBAL LVSF/  EF 65%  . CARPAL TUNNEL RELEASE Bilateral yrs ago  . CATARACT EXTRACTION W/ INTRAOCULAR LENS  IMPLANT, BILATERAL  2013  . CHOLECYSTECTOMY  1990  . COLONOSCOPY N/A 07/26/2015   Procedure: COLONOSCOPY;  Surgeon: Malissa Hippo, MD;  [facility]: [facility];  Service: Endoscopy;  Laterality: N/A;  1200  . CORONARY ANGIOPLASTY  11-13-1999   balloon angioplasty to mLAD , d2 of LAD  . CORONARY ANGIOPLASTY WITH STENT PLACEMENT  01-17-2000   PCI stenting to mLAD & D2 of LAD  . CORONARY ARTERY BYPASS GRAFT  07-02-2000   DR Tressie Stalker   SVG to diagonal, LIMA to LAD  . CYSTOSCOPY W/ RETROGRADES Bilateral 08/09/2014   Procedure: CYSTOSCOPY, BILATERAL RETROGRADE, HYDRODISTENSION, BLADDER BIOPSY WITH FULGERATION, INSTILL PYRIDIUM AND MARCAINE ;  Surgeon: Anner Crete, MD;  Location: [facility];  Service: Urology;  Laterality: Bilateral;  . KNEE ARTHROSCOPY Left 10-18-2003   SYNOVECTOMY  . LUMBAR SPINE SURGERY  x2    yrs ago  . ORIF HUMERUS FRACTURE Right 04-18-2008  . REPAIR FLEXOR TENDON HAND Right 09-20-2008   CARPI RADIALIS TENDON TRANSFER TO EXTENSOR TO INDEX,  MIDDLE, RING , LITTLE FINGERS AND DIGITI MININI  . ROTATOR CUFF REPAIR Right 1995  . TOTAL ABDOMINAL HYSTERECTOMY W/ BILATERAL SALPINGOOPHORECTOMY  age 89  . TRANSTHORACIC ECHOCARDIOGRAM  12-12-2013   MILD LVH/  EF 60-65%/  GRADE I DIASTOLIC DYSFUNCTION/  MILD LAE    Social History   Socioeconomic History  . Marital status: Married    Spouse name: Not on file  . Number of children: 2  . Years of education: Not on file  . Highest education level: Not on file  Occupational History    Employer: RETIRED  Social Needs  . Financial resource strain: Patient refused  . Food insecurity:    Worry: Patient refused    Inability: Patient refused  . Transportation needs:    Medical: Patient refused     Non-medical: Patient refused  Tobacco Use  . Smoking status: Never Smoker  . Smokeless tobacco: Never Used  Substance and Sexual Activity  . Alcohol use: No  . Drug use: No  . Sexual activity: Never  Lifestyle  . Physical activity:    Days per week: Patient refused    Minutes per session: Patient refused  . Stress: Patient refused  Relationships  . Social connections:    Talks on phone: Patient refused    Gets together: Patient refused    Attends religious service: Patient refused    Active member of club or organization: Patient refused    Attends meetings of clubs or organizations: Patient refused    Relationship status: Patient refused  . Intimate partner violence:    Fear of current or ex partner: Patient refused    Emotionally abused: Patient refused    Physically abused: Patient refused    Forced sexual activity: Patient refused  Other Topics Concern  . Not on file  Social History Narrative  . Not on file   Family History  Problem Relation Age of Onset  . Hypertension Mother   . Stroke Mother   . Coronary artery disease Other        Multiple first and second-degree relatives  . Aneurysm Other        Cerebral circulation  . Colon cancer Sister   . Stroke Sister   . Coronary artery disease Sister   . Clotting disorder Other        Children diagnosed with hypercoagulable state  . Coronary artery disease Brother       VITAL SIGNS BP 130/68   Pulse 69   Temp 98.5 F (36.9 C)   Resp 20   Ht 5' 4.5" (1.638 m)   Wt 201 lb 3.2 oz (91.3 kg)   SpO2 94%   BMI 34.00 kg/m   Outpatient Encounter Medications as of 03/02/2019  Medication Sig  . acetaminophen (TYLENOL) 325 MG tablet Take 2 tablets (650 mg total) by mouth every 6 (six) hours as needed for mild pain or headache (or Fever >/= 101).  [name] amLODipine (NORVASC) 5 MG tablet Take 5 mg by mouth daily.  [name] aspirin EC 81 MG tablet Take 1 tablet (81 mg total) by mouth daily.  . benzonatate (TESSALON) 200 MG  capsule Take 1 capsule (200 mg total) by mouth 3 (three) times daily as needed for cough.  . citalopram (CELEXA) 20 MG tablet Take 1 tablet (20 mg total) by mouth daily.  . clopidogrel (PLAVIX) 75 MG tablet Take 1 tablet (75 mg total) by mouth daily for 30 days.  [name] gabapentin (NEURONTIN) 300 MG capsule Take 300 mg by  mouth 2 (two) times daily.  . insulin NPH-regular Human (70-30) 100 UNIT/ML injection Inject 30 Units into the skin 2 (two) times daily.  [name] ipratropium-albuterol (DUONEB) 0.5-2.5 (3) MG/3ML SOLN Inhale 3 mLs into the lungs 4 (four) times daily as needed (for shortness of breath).   [name] levothyroxine (SYNTHROID, LEVOTHROID) 25 MCG tablet Take 25 mcg by mouth daily.   [name] lisinopril (PRINIVIL,ZESTRIL) 10 MG tablet Take 10 mg by mouth daily.   [name] LORazepam (ATIVAN) 0.5 MG tablet Take 1 tablet (0.5 mg total) by mouth every 12 (twelve) hours as needed for up to 14 days for anxiety or sleep.  . metFORMIN (GLUCOPHAGE) 500 MG tablet Take 500 mg by mouth 2 (two) times daily with a meal.    . nitroGLYCERIN (NITROSTAT) 0.4 MG SL tablet Place 0.4 mg under the tongue every 5 (five) minutes as needed for chest pain.   . NON FORMULARY Diet Type:  NAS, Consistent Carbohydrate  . oxyCODONE (OXY IR/ROXICODONE) 5 MG immediate release tablet Take 5 mg by mouth every 6 (six) hours as needed for severe pain.  . pantoprazole (PROTONIX) 40 MG tablet Take 1 tablet (40 mg total) by mouth daily at 6 (six) AM.  . simvastatin (ZOCOR) 20 MG tablet Take 20 mg by mouth daily.  [name] UNABLE TO FIND CPAP from home and previous home settings while sleeping  . [DISCONTINUED] oxyCODONE (OXY IR/ROXICODONE) 5 MG immediate release tablet Take 1 tablet (5 mg total) by mouth every 4 (four) hours as needed for up to 5 days (Moderate pain). (Patient not taking: Reported on 03/02/2019)   No facility-administered encounter medications on file as of 03/02/2019.      SIGNIFICANT DIAGNOSTIC EXAMS  LABS REVIEWED:   02-12-19: tsh  0.959 02-18-19: hgb a1c 8.5; chol 166; ldl 106; trig 90; hdl 42 02-23-19: wbc  14.3; hgb 12.2; hct 42.0; mcv 80.3; plt 321 glucose 235; bun 21; creat 0.70; k+ 3.9; na++ 137; ca 8.5    Review of Systems  Constitutional: Negative for malaise/fatigue.  Respiratory: Negative for cough and shortness of breath.   Cardiovascular: Positive for leg swelling. Negative for chest pain and palpitations.  Gastrointestinal: Negative for abdominal pain, constipation and heartburn.  Musculoskeletal: Negative for back pain, joint pain and myalgias.  Skin: Negative.   Neurological: Negative for dizziness.  Psychiatric/Behavioral: The patient is not nervous/anxious.     Physical Exam Constitutional:      General: She is not in acute distress.    Appearance: She is well-developed. She is obese. She is not diaphoretic.  Eyes:     Comments: History of bilateral cataract with lens implants   Neck:     Musculoskeletal: Neck supple.     Thyroid: No thyromegaly.  Cardiovascular:     Rate and Rhythm: Normal rate and regular rhythm.     Pulses: Normal pulses.     Heart sounds: Normal heart sounds.     Comments: History of CABG Pulmonary:     Effort: Pulmonary effort is normal. No respiratory distress.     Breath sounds: Normal breath sounds.  Abdominal:     General: Bowel sounds are normal. There is no distension.     Palpations: Abdomen is soft.     Tenderness: There is no abdominal tenderness.  Musculoskeletal:     Left lower leg: Edema present.     Comments: History of lumbar back surgery History of ORIF right humerus Is able to move all extremities   Lymphadenopathy:  Cervical: No cervical adenopathy.  Skin:    General: Skin is warm and dry.  Neurological:     Mental Status: She is alert and oriented to person, place, and time.      ASSESSMENT/ PLAN:  TODAY;   1. Hypertension associated with type 2 diabetes mellitus: is stable b/p 130/68: will continue norvasc 5 mg daily lisinopril 10  mg daily    2. Coronary artery disease of bypass graft of native hart with stable angina pectoris: is stable will continue plavix 75 mg daily asa 81 mg daily  ntg prn.  3 OSA (obstructive sleep apnea) is stable uses CPAP  4. Simple chronic bronchitis: is stable will continue duoneb four times daily as needed; tesselon perle 200 mg three times daily as needed   5. Subclinical iodine deficiency hypothyroidism: is stable tsh 0.959 will continue synthroid 25 mcg daily   6. GERD without esophagitis: is stable will continue protonix 40 mg daily   7. Dyslipidemia associated with type 2 diabetes mellitus: is stable LDL 106; will continue zocor 20 mg daily   8. Uncontrolled type 2 diabetes mellitus with peripheral neuropathy: is stable hgb a1c 8.5 will continue metformin 500 gm twice daily and NPH 70/30 30 units twice daily   9.  Major depression recurrent chronic: is stable will continue celexa 20 mg daily has ativan 0.5 mg twice daily for 14 days.   10. Bilateral lower extremity: is worse: will use TED hose and will monitor   11. Chronic  low back pain with sciatica sciatica laterality unspecified unspecified back pain laterality. Is stable has oxycodone 5 mg every 6 hours as needed for pain; uses this medication long term.     MD is aware of resident's narcotic use and is in agreement with current plan of care. We will attempt to wean resident as apropriate   Synthia Innocent NP [facility]  Contact [phone] Monday through Friday 8am- 5pm  After hours call [phone]

## 2019-03-05 ENCOUNTER — Encounter (HOSPITAL_COMMUNITY)
Admission: AD | Admit: 2019-03-05 | Discharge: 2019-03-05 | Disposition: A | Discharge status: Home / Self Care | Payer: Medicare HMO | Source: Skilled Nursing Facility | Attending: Internal Medicine | Admitting: Internal Medicine

## 2019-03-05 DIAGNOSIS — I639 Cerebral infarction, unspecified: Secondary | ICD-10-CM | POA: Insufficient documentation

## 2019-03-05 LAB — CBC WITH DIFFERENTIAL/PLATELET
ABS IMMATURE GRANULOCYTES: 0.05 10*3/uL (ref 0.00–0.07)
Basophils Absolute: 0 10*3/uL (ref 0.0–0.1)
Basophils Relative: 0 %
Eosinophils Absolute: 0.2 10*3/uL (ref 0.0–0.5)
Eosinophils Relative: 2 %
HCT: 37.8 % (ref 36.0–46.0)
Hemoglobin: 11.1 g/dL — ABNORMAL LOW (ref 12.0–15.0)
Immature Granulocytes: 0 %
Lymphocytes Relative: 20 %
Lymphs Abs: 2.4 10*3/uL (ref 0.7–4.0)
MCH: 24.6 pg — ABNORMAL LOW (ref 26.0–34.0)
MCHC: 29.4 g/dL — ABNORMAL LOW (ref 30.0–36.0)
MCV: 83.8 fL (ref 80.0–100.0)
MONO ABS: 1.4 10*3/uL — AB (ref 0.1–1.0)
Monocytes Relative: 12 %
Neutro Abs: 8 10*3/uL — ABNORMAL HIGH (ref 1.7–7.7)
Neutrophils Relative %: 66 %
PLATELETS: 263 10*3/uL (ref 150–400)
RBC: 4.51 MIL/uL (ref 3.87–5.11)
RDW: 18.1 % — ABNORMAL HIGH (ref 11.5–15.5)
WBC: 12.1 10*3/uL — ABNORMAL HIGH (ref 4.0–10.5)
nRBC: 0 % (ref 0.0–0.2)

## 2019-03-05 LAB — INFLUENZA PANEL BY PCR (TYPE A & B)
INFLAPCR: NEGATIVE
Influenza B By PCR: NEGATIVE

## 2019-03-08 ENCOUNTER — Encounter: Payer: Self-pay | Admitting: Adult Health

## 2019-03-08 ENCOUNTER — Non-Acute Institutional Stay (SKILLED_NURSING_FACILITY): Payer: Medicare HMO | Admitting: Adult Health

## 2019-03-08 ENCOUNTER — Encounter (HOSPITAL_COMMUNITY)
Admission: RE | Admit: 2019-03-08 | Discharge: 2019-03-08 | Disposition: A | Discharge status: Home / Self Care | Payer: Medicare HMO | Source: Skilled Nursing Facility | Attending: Internal Medicine | Admitting: Internal Medicine

## 2019-03-08 DIAGNOSIS — K219 Gastro-esophageal reflux disease without esophagitis: Secondary | ICD-10-CM | POA: Diagnosis not present

## 2019-03-08 DIAGNOSIS — E440 Moderate protein-calorie malnutrition: Secondary | ICD-10-CM

## 2019-03-08 DIAGNOSIS — D508 Other iron deficiency anemias: Secondary | ICD-10-CM | POA: Diagnosis not present

## 2019-03-08 DIAGNOSIS — J410 Simple chronic bronchitis: Secondary | ICD-10-CM | POA: Diagnosis not present

## 2019-03-08 DIAGNOSIS — E02 Subclinical iodine-deficiency hypothyroidism: Secondary | ICD-10-CM | POA: Diagnosis not present

## 2019-03-08 LAB — CBC WITH DIFFERENTIAL/PLATELET
Abs Immature Granulocytes: 0.02 10*3/uL (ref 0.00–0.07)
BASOS ABS: 0 10*3/uL (ref 0.0–0.1)
Basophils Relative: 1 %
Eosinophils Absolute: 0.2 10*3/uL (ref 0.0–0.5)
Eosinophils Relative: 3 %
HCT: 39.4 % (ref 36.0–46.0)
Hemoglobin: 11.6 g/dL — ABNORMAL LOW (ref 12.0–15.0)
Immature Granulocytes: 0 %
Lymphocytes Relative: 32 %
Lymphs Abs: 2.3 10*3/uL (ref 0.7–4.0)
MCH: 24.2 pg — ABNORMAL LOW (ref 26.0–34.0)
MCHC: 29.4 g/dL — AB (ref 30.0–36.0)
MCV: 82.3 fL (ref 80.0–100.0)
Monocytes Absolute: 0.8 10*3/uL (ref 0.1–1.0)
Monocytes Relative: 11 %
Neutro Abs: 3.8 10*3/uL (ref 1.7–7.7)
Neutrophils Relative %: 53 %
Platelets: 252 10*3/uL (ref 150–400)
RBC: 4.79 MIL/uL (ref 3.87–5.11)
RDW: 18.3 % — ABNORMAL HIGH (ref 11.5–15.5)
WBC: 7.2 10*3/uL (ref 4.0–10.5)
nRBC: 0 % (ref 0.0–0.2)

## 2019-03-08 LAB — IRON AND TIBC
Iron: 16 ug/dL — ABNORMAL LOW (ref 28–170)
Saturation Ratios: 5 % — ABNORMAL LOW (ref 10.4–31.8)
TIBC: 292 ug/dL (ref 250–450)
UIBC: 276 ug/dL

## 2019-03-08 LAB — COMPREHENSIVE METABOLIC PANEL
ALBUMIN: 3.3 g/dL — AB (ref 3.5–5.0)
ALT: 17 U/L (ref 0–44)
AST: 15 U/L (ref 15–41)
Alkaline Phosphatase: 51 U/L (ref 38–126)
Anion gap: 9 (ref 5–15)
BUN: 14 mg/dL (ref 8–23)
CO2: 28 mmol/L (ref 22–32)
Calcium: 9.3 mg/dL (ref 8.9–10.3)
Chloride: 104 mmol/L (ref 98–111)
Creatinine, Ser: 0.78 mg/dL (ref 0.44–1.00)
GFR calc Af Amer: >60 mL/min (ref >60)
GFR calc non Af Amer: >60 mL/min (ref >60)
Glucose, Bld: 133 mg/dL — ABNORMAL HIGH (ref 70–99)
Potassium: 4.5 mmol/L (ref 3.5–5.1)
Sodium: 141 mmol/L (ref 135–145)
Total Bilirubin: 0.4 mg/dL (ref 0.3–1.2)
Total Protein: 6.4 g/dL — ABNORMAL LOW (ref 6.5–8.1)

## 2019-03-08 LAB — FOLATE: FOLATE: 29.8 ng/mL (ref >5.9)

## 2019-03-08 LAB — FERRITIN: Ferritin: 11 ng/mL (ref 11–307)

## 2019-03-08 NOTE — Progress Notes (Signed)
Location:   [facility] Room Number: 152 D Place of Service:  SNF (31)   CODE STATUS: Full Code  Allergies  Allergen Reactions  . Codeine Itching    Chief Complaint  Patient presents with  . Medical Management of Chronic Issues    Simple chronic bronchitis subclinical iodine deficiency hypothyroidism; gerd without esophagitis. Weekly follow up for the first 30 days post hospitalization.     HPI:  She is a 83 year old short term rehab patient being seen for the management of her chronic illnesses: bronchitis; hypothyroidism; gerd. She denies any heart burn; no cough or shortness of breath; there are no reports of any further fevers,   Past Medical History:  Diagnosis Date  . Arthritis   . CAD (coronary artery disease)    Multivessel status post CABG 2001  . Chronic obstructive pulmonary disease (HCC)   . Chronic pain   . DDD (degenerative disc disease), lumbar   . Diastolic CHF (HCC)   . Essential hypertension   . Hematuria   . History of CVA (cerebrovascular accident)    2013 - lacunar infarct right thalamus - residual left side of  lip numb  and left eye vision worse (which corrented lens implant from cataract extraction)  . History of MI (myocardial infarction)    July 2001  . Hyperlipidemia   . Hypothyroidism   . Insulin dependent type 2 diabetes mellitus (HCC)   . Lesion of bladder   . Lower urinary tract symptoms (LUTS)   . OSA (obstructive sleep apnea)   . Peripheral neuropathy   . Psoriasis   . SUI (stress urinary incontinence, female)   . Varicose veins     Past Surgical History:  Procedure Laterality Date  . APPENDECTOMY  age 64  . CARDIAC CATHETERIZATION  1991   No sig. cad  . CARDIAC CATHETERIZATION  07-01-2000   high grade in-stent restenosis  . CARDIAC CATHETERIZATION  04-30-2001   single native vessel cad/ graft patent  . CARDIAC CATHETERIZATION  02-13-2006  dr Jenness Corner cad with total occluded LAD  and  70% ostial  of small ramus/  grafts patent/  normal ef  . CARDIAC CATHETERIZATION  01-20-2000  dr Clifton James   essentially unchanged;  severe single vessel cad with diffuse disease throughout CFX and RCA/ ef 66%;  chf with preserved LVSF  . CARDIOVASCULAR STRESS TEST  08-28-2011   DR ROTHBART   NEGATIVE NUCLEAR STUDY/  GLOBAL LVSF/  EF 65%  . CARPAL TUNNEL RELEASE Bilateral yrs ago  . CATARACT EXTRACTION W/ INTRAOCULAR LENS  IMPLANT, BILATERAL  2013  . CHOLECYSTECTOMY  1990  . COLONOSCOPY N/A 07/26/2015   Procedure: COLONOSCOPY;  Surgeon: Malissa Hippo, MD;  [facility]: [facility];  Service: Endoscopy;  Laterality: N/A;  1200  . CORONARY ANGIOPLASTY  11-13-1999   balloon angioplasty to mLAD , d2 of LAD  . CORONARY ANGIOPLASTY WITH STENT PLACEMENT  01-17-2000   PCI stenting to mLAD & D2 of LAD  . CORONARY ARTERY BYPASS GRAFT  07-02-2000   DR Tressie Stalker   SVG to diagonal, LIMA to LAD  . CYSTOSCOPY W/ RETROGRADES Bilateral 08/09/2014   Procedure: CYSTOSCOPY, BILATERAL RETROGRADE, HYDRODISTENSION, BLADDER BIOPSY WITH FULGERATION, INSTILL PYRIDIUM AND MARCAINE ;  Surgeon: Anner Crete, MD;  Location: [facility];  Service: Urology;  Laterality: Bilateral;  . KNEE ARTHROSCOPY Left 10-18-2003   SYNOVECTOMY  . LUMBAR SPINE SURGERY  x2  yrs ago  . ORIF HUMERUS FRACTURE Right 04-18-2008  . REPAIR FLEXOR TENDON HAND Right 09-20-2008   CARPI RADIALIS TENDON TRANSFER TO EXTENSOR TO INDEX, MIDDLE, RING , LITTLE FINGERS AND DIGITI MININI  . ROTATOR CUFF REPAIR Right 1995  . TOTAL ABDOMINAL HYSTERECTOMY W/ BILATERAL SALPINGOOPHORECTOMY  age 74  . TRANSTHORACIC ECHOCARDIOGRAM  12-12-2013   MILD LVH/  EF 60-65%/  GRADE I DIASTOLIC DYSFUNCTION/  MILD LAE    Social History   Socioeconomic History  . Marital status: Married    Spouse name: Not on file  . Number of children: 2  . Years of education: Not on file  . Highest education level: Not on file  Occupational History    Employer: RETIRED   Social Needs  . Financial resource strain: Patient refused  . Food insecurity:    Worry: Patient refused    Inability: Patient refused  . Transportation needs:    Medical: Patient refused    Non-medical: Patient refused  Tobacco Use  . Smoking status: Never Smoker  . Smokeless tobacco: Never Used  Substance and Sexual Activity  . Alcohol use: No  . Drug use: No  . Sexual activity: Never  Lifestyle  . Physical activity:    Days per week: Patient refused    Minutes per session: Patient refused  . Stress: Patient refused  Relationships  . Social connections:    Talks on phone: Patient refused    Gets together: Patient refused    Attends religious service: Patient refused    Active member of club or organization: Patient refused    Attends meetings of clubs or organizations: Patient refused    Relationship status: Patient refused  . Intimate partner violence:    Fear of current or ex partner: Patient refused    Emotionally abused: Patient refused    Physically abused: Patient refused    Forced sexual activity: Patient refused  Other Topics Concern  . Not on file  Social History Narrative  . Not on file   Family History  Problem Relation Age of Onset  . Hypertension Mother   . Stroke Mother   . Coronary artery disease Other        Multiple first and second-degree relatives  . Aneurysm Other        Cerebral circulation  . Colon cancer Sister   . Stroke Sister   . Coronary artery disease Sister   . Clotting disorder Other        Children diagnosed with hypercoagulable state  . Coronary artery disease Brother       VITAL SIGNS BP 119/70   Pulse 65   Temp 97.6 F (36.4 C)   Resp 17   Ht 5' 4.5" (1.638 m)   Wt 197 lb 3.2 oz (89.4 kg)   SpO2 94%   BMI 33.33 kg/m   Outpatient Encounter Medications as of 03/08/2019  Medication Sig  . acetaminophen (TYLENOL) 325 MG tablet Take 2 tablets (650 mg total) by mouth every 6 (six) hours as needed for mild pain or  headache (or Fever >/= 101).  [name] amLODipine (NORVASC) 5 MG tablet Take 5 mg by mouth daily.  [name] aspirin EC 81 MG tablet Take 1 tablet (81 mg total) by mouth daily.  . benzonatate (TESSALON) 200 MG capsule Take 1 capsule (200 mg total) by mouth 3 (three) times daily as needed for cough.  . citalopram (CELEXA) 20 MG tablet Take 1 tablet (20 mg total) by mouth daily.  . clopidogrel (  PLAVIX) 75 MG tablet Take 1 tablet (75 mg total) by mouth daily for 30 days.  . gabapentin (NEURONTIN) 300 MG capsule Take 300 mg by mouth 2 (two) times Marland Kitchendaily.  . insulin NPH-regular Human (70-30) 100 UNIT/ML injection Inject 30 Units into the skin 2 (two) times daily.  [name]. ipratropium-albuterol (DUONEB) 0.5-2.5 (3) MG/3ML SOLN Inhale 3 mLs into the lungs 4 (four) times daily as needed (for shortness of breath).   [name]. levothyroxine (SYNTHROID, LEVOTHROID) 25 MCG tablet Take 25 mcg by mouth daily.   [name]. lisinopril (PRINIVIL,ZESTRIL) 10 MG tablet Take 10 mg by mouth daily.   [name]. LORazepam (ATIVAN) 0.5 MG tablet Take 1 tablet (0.5 mg total) by mouth every 12 (twelve) hours as needed for up to 14 days for anxiety or sleep.  . metFORMIN (GLUCOPHAGE) 500 MG tablet Take 500 mg by mouth 2 (two) times daily with a meal.    . nitroGLYCERIN (NITROSTAT) 0.4 MG SL tablet Place 0.4 mg under the tongue every 5 (five) minutes as needed for chest pain.   . NON FORMULARY Diet Type:  NAS, Consistent Carbohydrate  . oxyCODONE (OXY IR/ROXICODONE) 5 MG immediate release tablet Take 1 tablet (5 mg total) by mouth every 6 (six) hours as needed for severe pain.  . pantoprazole (PROTONIX) 40 MG tablet Take 1 tablet (40 mg total) by mouth daily at 6 (six) AM.  . simvastatin (ZOCOR) 20 MG tablet Take 20 mg by mouth daily.  [name]. UNABLE TO FIND CPAP from home and previous home settings while sleeping   No facility-administered encounter medications on file as of 03/08/2019.      SIGNIFICANT DIAGNOSTIC EXAMS  LABS REVIEWED: PREVIOUS   02-12-19: tsh 0.959  02-18-19: hgb a1c 8.5; chol 166; ldl 106; trig 90; hdl 42 02-23-19: wbc  14.3; hgb 12.2; hct 42.0; mcv 80.3; plt 321 glucose 235; bun 21; creat 0.70; k+ 3.9; na++ 137; ca 8.5   TODAY:   03-05-19: wbc 12.1; hgb 11.1; hct 37.8; mcv 83.8 plt 263 influenza neg; blood culture: no growth 03-08-19: wbc 7.2; hgb 11.6; hct 39.4; mcv 83.2; plt 252; glucose 133; bun 14; creat 0.78; k+ 4.5; na++ 141; ca 9.3; liver normal albumin 3.3 iron 16; tibc 292; folate 29.8; ferritin 11  Review of Systems  Constitutional: Negative for malaise/fatigue.  Respiratory: Negative for cough and shortness of breath.   Cardiovascular: Negative for chest pain, palpitations and leg swelling.  Gastrointestinal: Negative for abdominal pain, constipation and heartburn.  Musculoskeletal: Negative for back pain, joint pain and myalgias.  Skin: Negative.   Neurological: Negative for dizziness.  Psychiatric/Behavioral: The patient is not nervous/anxious.     Physical Exam Constitutional:      General: She is not in acute distress.    Appearance: She is well-developed. She is obese. She is not diaphoretic.  Eyes:     Comments: History of bilateral cataract with lens implants    Neck:     Musculoskeletal: Neck supple.     Thyroid: No thyromegaly.  Cardiovascular:     Rate and Rhythm: Normal rate and regular rhythm.     Pulses: Normal pulses.     Heart sounds: Normal heart sounds.     Comments: History of CABG  Pulmonary:     Effort: Pulmonary effort is normal. No respiratory distress.     Breath sounds: Normal breath sounds.  Abdominal:     General: Bowel sounds are normal. There is no distension.     Palpations: Abdomen is soft.  Tenderness: There is no abdominal tenderness.  Musculoskeletal:     Right lower leg: No edema.     Left lower leg: No edema.     Comments: History of lumbar back surgery History of ORIF right humerus Is able to move all extremities    Lymphadenopathy:     Cervical: No cervical  adenopathy.  Skin:    General: Skin is warm and dry.  Neurological:     Mental Status: She is alert and oriented to person, place, and time.  Psychiatric:        Mood and Affect: Mood normal.      ASSESSMENT/ PLAN:  TODAY;   1. Simple chronic bronchitis: is stable will continue duoneb four times daily as needed tesselon perle 200 mg three times daily as needed  2. Subclinical iodine deficiency hypothyroidism: is stable tsh 0.959; will continue synthroid 25 mcg daily   3. GERD without esophagitis: is stable will continue protonix 40 mg daily   4. Iron deficiency anemia: is without change iron 16 will begin ferrous sulfate 325 mg daily   5. Chronic constipation: is without change will start colace daily as she is starting on iron therapy   6. Protein calorie malnutrition; moderate: is without change albumin 3.3 will begin prostat 30 cc twice daily    PREVIOUS   7. Hypertension associated with type 2 diabetes mellitus: is stable b/p 130/68: will continue norvasc 5 mg daily lisinopril 10 mg daily    8. Coronary artery disease of bypass graft of native hart with stable angina pectoris: is stable will continue plavix 75 mg daily asa 81 mg daily  ntg prn.  9 OSA (obstructive sleep apnea) is stable uses CPAP  10. Dyslipidemia associated with type 2 diabetes mellitus: is stable LDL 106; will continue zocor 20 mg daily   11. Uncontrolled type 2 diabetes mellitus with peripheral neuropathy: is stable hgb a1c 8.5 will continue metformin 500 gm twice daily and NPH 70/30 30 units twice daily   12.  Major depression recurrent chronic: is stable will continue celexa 20 mg daily has ativan 0.5 mg twice daily for 14 days.   13. Bilateral lower extremity: is worse: will use TED hose and will monitor   14. Chronic  low back pain with sciatica sciatica laterality unspecified unspecified back pain laterality. Is stable has oxycodone 5 mg every 6 hours as needed for pain; uses this medication  long term.          MD is aware of resident's narcotic use and is in agreement with current plan of care. We will attempt to wean resident as apropriate   Synthia Innocent NP [facility]  Contact [phone] Monday through Friday 8am- 5pm  After hours call [phone]

## 2019-03-09 ENCOUNTER — Other Ambulatory Visit: Payer: Self-pay | Admitting: Adult Health

## 2019-03-09 DIAGNOSIS — I10 Essential (primary) hypertension: Secondary | ICD-10-CM

## 2019-03-09 DIAGNOSIS — E1159 Type 2 diabetes mellitus with other circulatory complications: Secondary | ICD-10-CM | POA: Insufficient documentation

## 2019-03-09 DIAGNOSIS — K219 Gastro-esophageal reflux disease without esophagitis: Secondary | ICD-10-CM | POA: Insufficient documentation

## 2019-03-09 DIAGNOSIS — E1142 Type 2 diabetes mellitus with diabetic polyneuropathy: Secondary | ICD-10-CM | POA: Insufficient documentation

## 2019-03-09 DIAGNOSIS — E1165 Type 2 diabetes mellitus with hyperglycemia: Secondary | ICD-10-CM

## 2019-03-09 DIAGNOSIS — E785 Hyperlipidemia, unspecified: Secondary | ICD-10-CM

## 2019-03-09 DIAGNOSIS — F339 Major depressive disorder, recurrent, unspecified: Secondary | ICD-10-CM | POA: Insufficient documentation

## 2019-03-09 DIAGNOSIS — IMO0002 Reserved for concepts with insufficient information to code with codable children: Secondary | ICD-10-CM | POA: Insufficient documentation

## 2019-03-09 DIAGNOSIS — E1169 Type 2 diabetes mellitus with other specified complication: Secondary | ICD-10-CM | POA: Insufficient documentation

## 2019-03-09 IMAGING — DX DG CHEST 2V
2 series · 2 of 2 positions shown · non-contrast
Comparison: 08/01/2016 and earlier.

CLINICAL DATA: 80-year-old female with weakness. Initial encounter.

EXAM:
CHEST  2 VIEW

[chest lat]
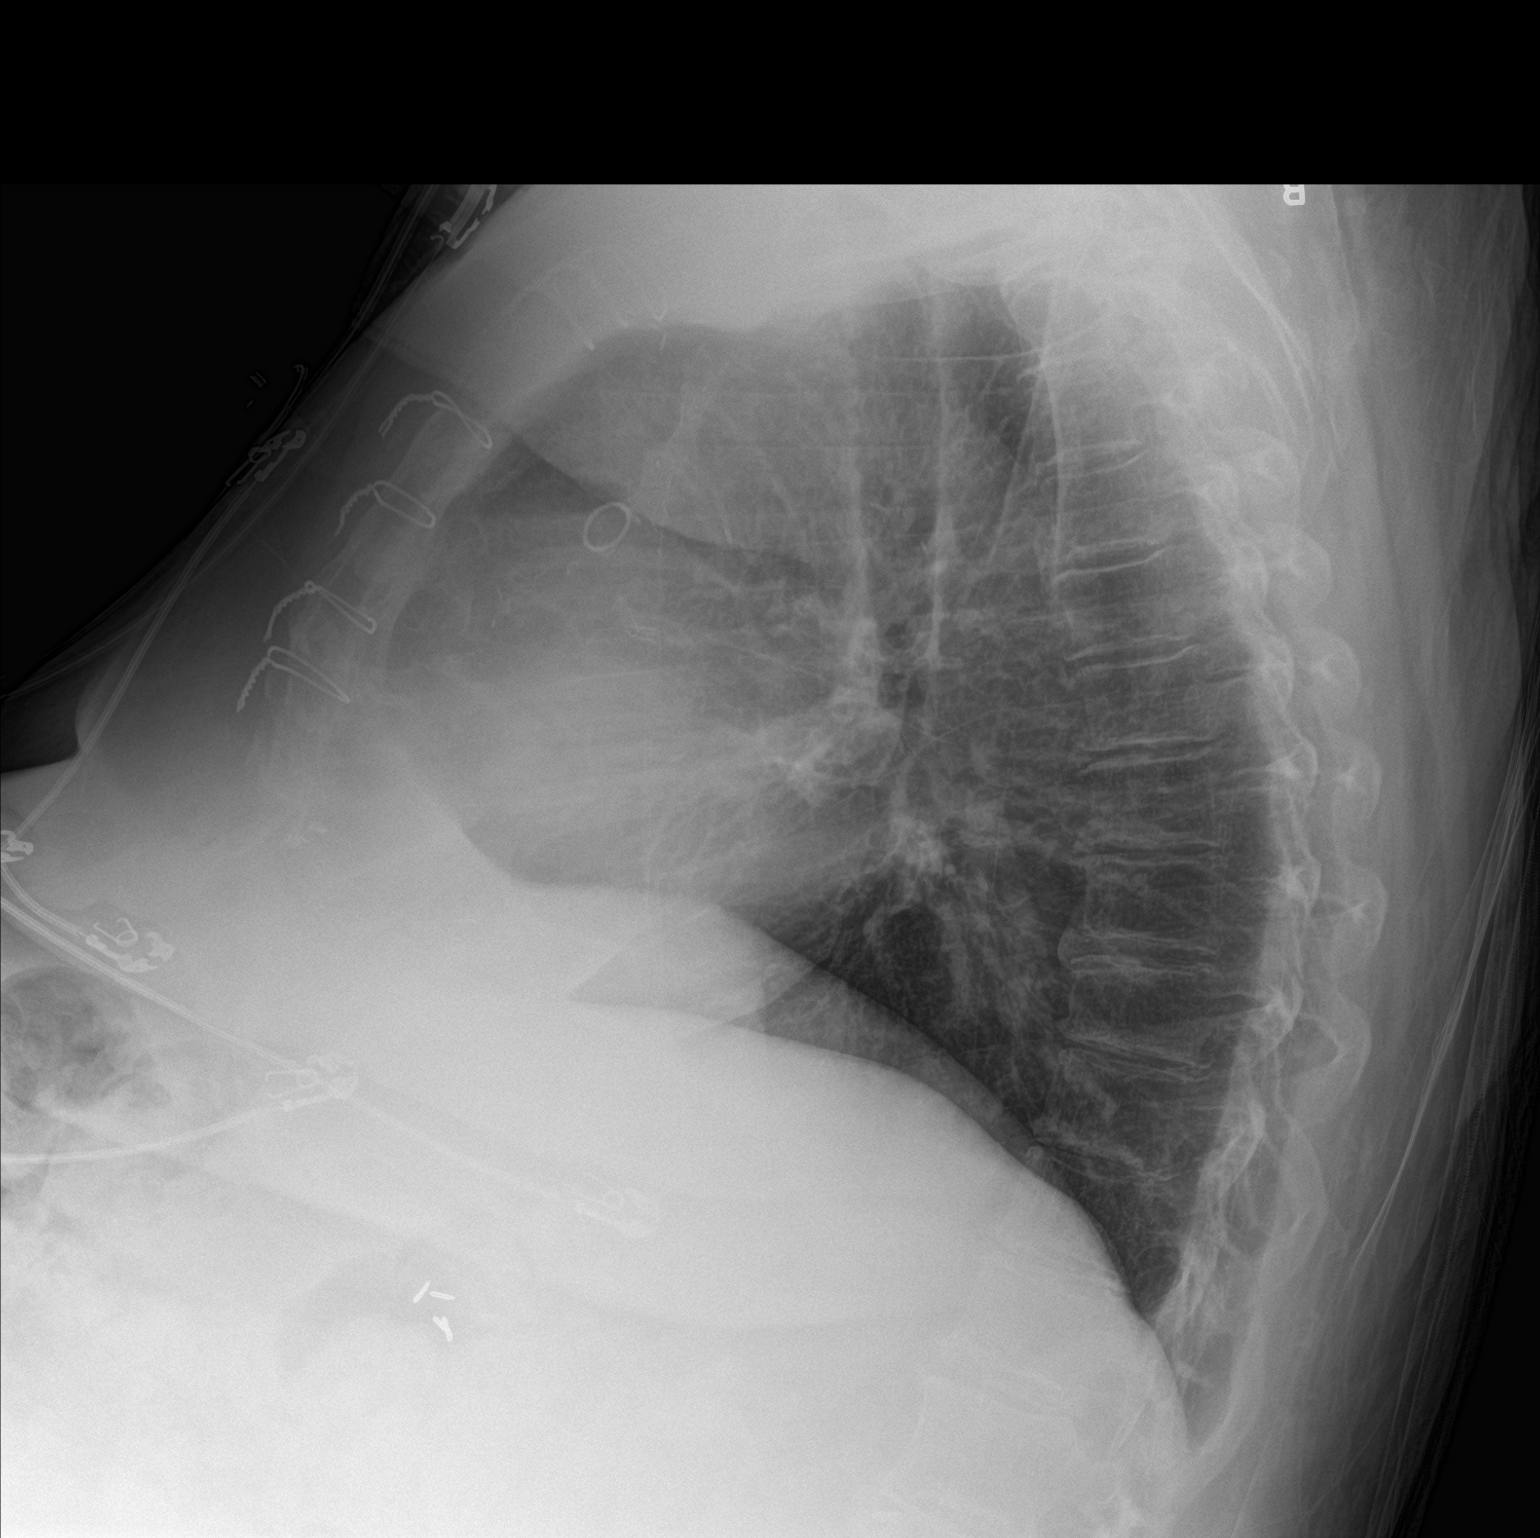

[chest ap]
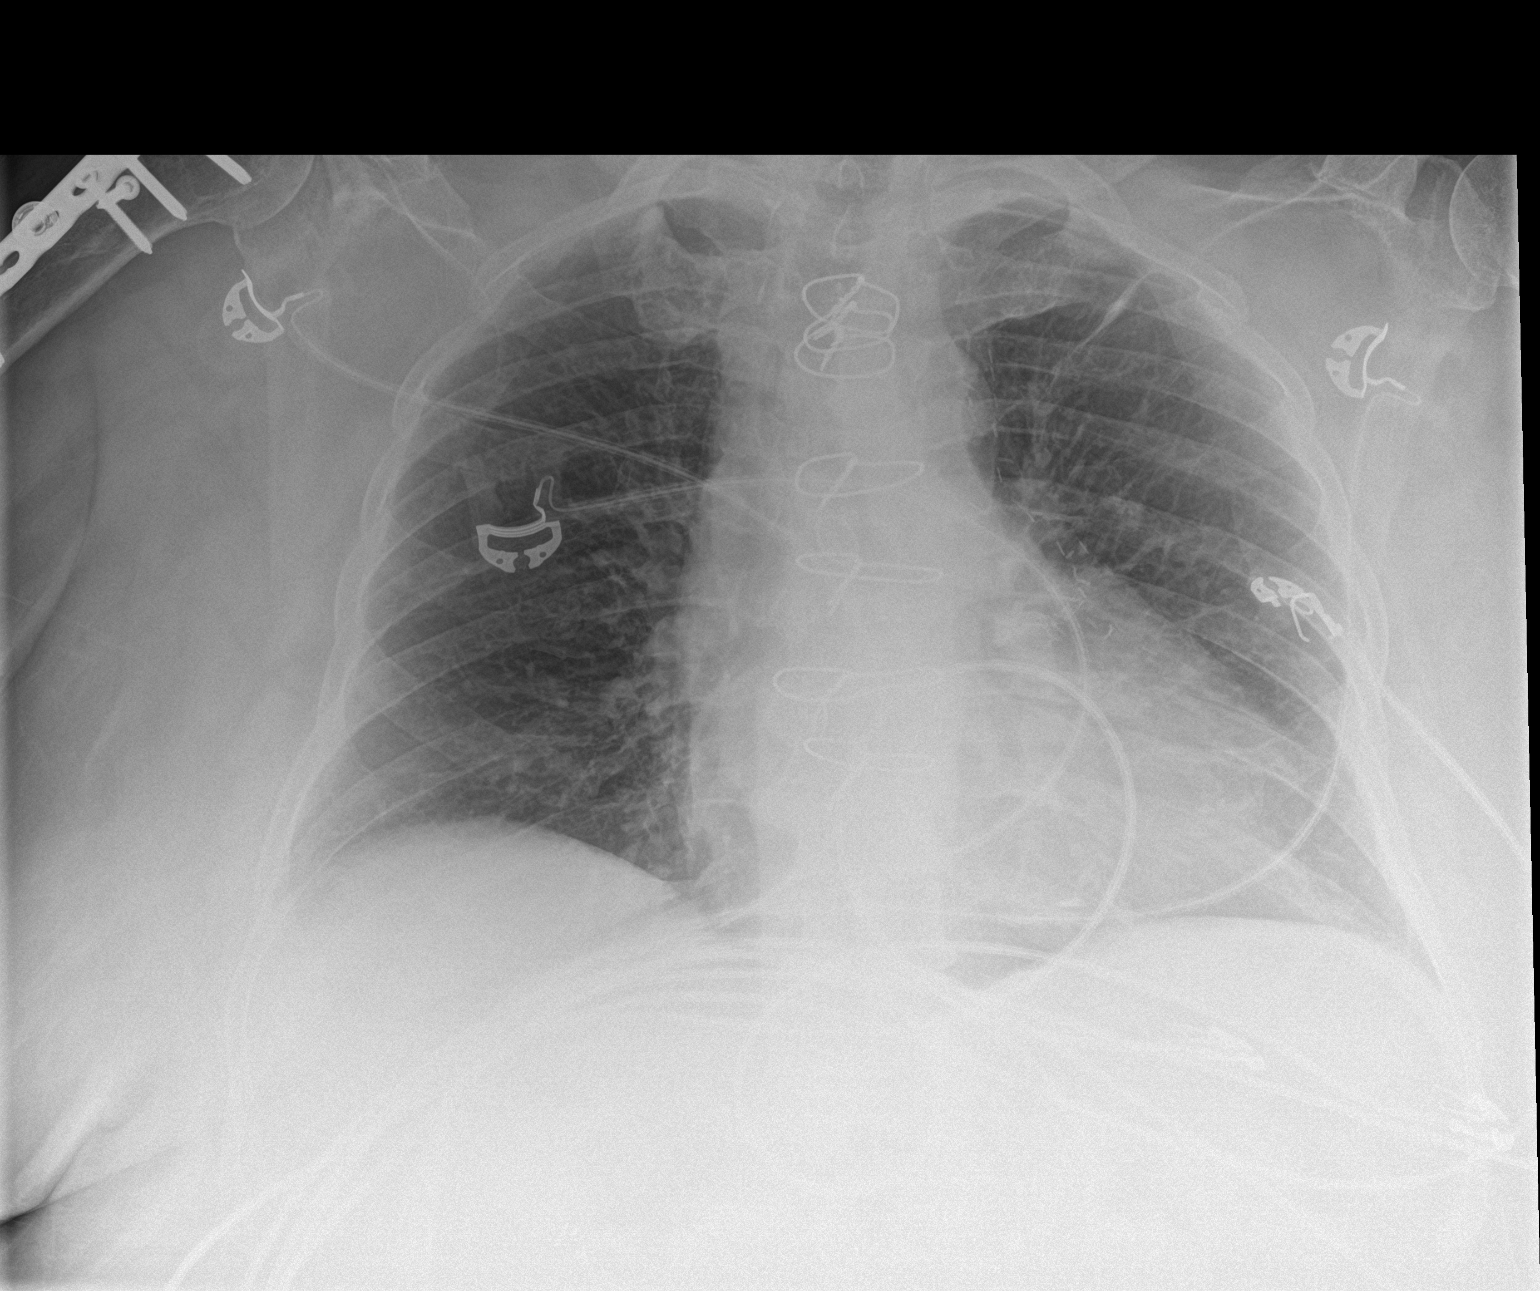

[2 of 2 positions shown; findings below may reference images not displayed]

FINDINGS: Upright AP and lateral views of the chest. Sequelae of CABG,
proximal right humerus Orthopedic surgery, and cholecystectomy re -
demonstrated. Stable lung volumes. Chronic curvilinear scarring in
the left upper lobe is unchanged. No pneumothorax, pulmonary edema,
pleural effusion or acute pulmonary opacity. Osteopenia. No acute
osseous abnormality identified. Negative visible bowel gas pattern.
IMPRESSION: No acute cardiopulmonary abnormality.

## 2019-03-10 ENCOUNTER — Encounter: Payer: Self-pay | Admitting: Adult Health

## 2019-03-10 LAB — CULTURE, BLOOD (ROUTINE X 2)
CULTURE: NO GROWTH
Culture: NO GROWTH
Special Requests: ADEQUATE

## 2019-03-10 NOTE — Progress Notes (Signed)
Location:   [facility] Room Number: 152 D Place of Service:  SNF (31)   CODE STATUS: Full Code  Allergies  Allergen Reactions  . Codeine Itching    Chief Complaint  Patient presents with  . Acute Visit    Pain Management    HPI:    Past Medical History:  Diagnosis Date  . Arthritis   . CAD (coronary artery disease)    Multivessel status post CABG 2001  . Chronic obstructive pulmonary disease (HCC)   . Chronic pain   . DDD (degenerative disc disease), lumbar   . Diastolic CHF (HCC)   . Essential hypertension   . Hematuria   . History of CVA (cerebrovascular accident)    2013 - lacunar infarct right thalamus - residual left side of  lip numb  and left eye vision worse (which corrented lens implant from cataract extraction)  . History of MI (myocardial infarction)    July 2001  . Hyperlipidemia   . Hypothyroidism   . Insulin dependent type 2 diabetes mellitus (HCC)   . Lesion of bladder   . Lower urinary tract symptoms (LUTS)   . OSA (obstructive sleep apnea)   . Peripheral neuropathy   . Psoriasis   . SUI (stress urinary incontinence, female)   . Varicose veins     Past Surgical History:  Procedure Laterality Date  . APPENDECTOMY  age 36  . CARDIAC CATHETERIZATION  1991   No sig. cad  . CARDIAC CATHETERIZATION  07-01-2000   high grade in-stent restenosis  . CARDIAC CATHETERIZATION  04-30-2001   single native vessel cad/ graft patent  . CARDIAC CATHETERIZATION  02-13-2006  dr Jenness Corner cad with total occluded LAD  and  70% ostial of small ramus/  grafts patent/  normal ef  . CARDIAC CATHETERIZATION  01-20-2000  dr Clifton James   essentially unchanged;  severe single vessel cad with diffuse disease throughout CFX and RCA/ ef 66%;  chf with preserved LVSF  . CARDIOVASCULAR STRESS TEST  08-28-2011   DR ROTHBART   NEGATIVE NUCLEAR STUDY/  GLOBAL LVSF/  EF 65%  . CARPAL TUNNEL RELEASE Bilateral yrs ago  . CATARACT EXTRACTION W/  INTRAOCULAR LENS  IMPLANT, BILATERAL  2013  . CHOLECYSTECTOMY  1990  . COLONOSCOPY N/A 07/26/2015   Procedure: COLONOSCOPY;  Surgeon: Malissa Hippo, MD;  [facility];  Service: Endoscopy;  Laterality: N/A;  1200  . CORONARY ANGIOPLASTY  11-13-1999   balloon angioplasty to mLAD , d2 of LAD  . CORONARY ANGIOPLASTY WITH STENT PLACEMENT  01-17-2000   PCI stenting to mLAD & D2 of LAD  . CORONARY ARTERY BYPASS GRAFT  07-02-2000   DR Tressie Stalker   SVG to diagonal, LIMA to LAD  . CYSTOSCOPY W/ RETROGRADES Bilateral 08/09/2014   Procedure: CYSTOSCOPY, BILATERAL RETROGRADE, HYDRODISTENSION, BLADDER BIOPSY WITH FULGERATION, INSTILL PYRIDIUM AND MARCAINE ;  Surgeon: Anner Crete, MD;  Location: [facility];  Service: Urology;  Laterality: Bilateral;  . KNEE ARTHROSCOPY Left 10-18-2003   SYNOVECTOMY  . LUMBAR SPINE SURGERY  x2    yrs ago  . ORIF HUMERUS FRACTURE Right 04-18-2008  . REPAIR FLEXOR TENDON HAND Right 09-20-2008   CARPI RADIALIS TENDON TRANSFER TO EXTENSOR TO INDEX, MIDDLE, RING , LITTLE FINGERS AND DIGITI MININI  . ROTATOR CUFF REPAIR Right 1995  . TOTAL ABDOMINAL HYSTERECTOMY W/ BILATERAL SALPINGOOPHORECTOMY  age 76  . TRANSTHORACIC ECHOCARDIOGRAM  12-12-2013   MILD LVH/  EF 60-65%/  GRADE I DIASTOLIC DYSFUNCTION/  MILD LAE    Social History   Socioeconomic History  . Marital status: Married    Spouse name: Not on file  . Number of children: 2  . Years of education: Not on file  . Highest education level: Not on file  Occupational History    Employer: RETIRED  Social Needs  . Financial resource strain: Patient refused  . Food insecurity:    Worry: Patient refused    Inability: Patient refused  . Transportation needs:    Medical: Patient refused    Non-medical: Patient refused  Tobacco Use  . Smoking status: Never Smoker  . Smokeless tobacco: Never Used  Substance and Sexual Activity  . Alcohol use: No  . Drug use: No  . Sexual activity:  Never  Lifestyle  . Physical activity:    Days per week: Patient refused    Minutes per session: Patient refused  . Stress: Patient refused  Relationships  . Social connections:    Talks on phone: Patient refused    Gets together: Patient refused    Attends religious service: Patient refused    Active member of club or organization: Patient refused    Attends meetings of clubs or organizations: Patient refused    Relationship status: Patient refused  . Intimate partner violence:    Fear of current or ex partner: Patient refused    Emotionally abused: Patient refused    Physically abused: Patient refused    Forced sexual activity: Patient refused  Other Topics Concern  . Not on file  Social History Narrative  . Not on file   Family History  Problem Relation Age of Onset  . Hypertension Mother   . Stroke Mother   . Coronary artery disease Other        Multiple first and second-degree relatives  . Aneurysm Other        Cerebral circulation  . Colon cancer Sister   . Stroke Sister   . Coronary artery disease Sister   . Clotting disorder Other        Children diagnosed with hypercoagulable state  . Coronary artery disease Brother       VITAL SIGNS BP 130/78   Pulse 65   Temp 97.8 F (36.6 C)   Resp 18   Ht 5' 4.5" (1.638 m)   Wt 197 lb 3.2 oz (89.4 kg)   SpO2 94%   BMI 33.33 kg/m   Outpatient Encounter Medications as of 03/10/2019  Medication Sig  . acetaminophen (TYLENOL) 325 MG tablet Take 2 tablets (650 mg total) by mouth every 6 (six) hours as needed for mild pain or headache (or Fever >/= 101).  . Amino Acids-Protein Hydrolys (FEEDING SUPPLEMENT, PRO-STAT SUGAR FREE 64,) LIQD Take 30 mLs by mouth daily.  [name] amLODipine (NORVASC) 5 MG tablet Take 5 mg by mouth daily.  [name] aspirin EC 81 MG tablet Take 1 tablet (81 mg total) by mouth daily.  . benzonatate (TESSALON) 200 MG capsule Take 1 capsule (200 mg total) by mouth 3 (three) times daily as needed for cough.  .  citalopram (CELEXA) 20 MG tablet Take 1 tablet (20 mg total) by mouth daily.  . clopidogrel (PLAVIX) 75 MG tablet Take 1 tablet (75 mg total) by mouth daily for 30 days.  [name] docusate sodium (COLACE) 100 MG capsule Take 100 mg by mouth daily.  . ferrous sulfate 325 (65 FE) MG tablet Take 325 mg by mouth daily with  breakfast.  . gabapentin (NEURONTIN) 300 MG capsule Take 300 mg by mouth 2 (two) times daily.  . Insulin Isophane & Regular Human (HUMULIN 70/30 KWIKPEN) (70-30) 100 UNIT/ML PEN Inject 25 Units into the skin 2 (two) times daily.  [name] ipratropium-albuterol (DUONEB) 0.5-2.5 (3) MG/3ML SOLN Inhale 3 mLs into the lungs 4 (four) times daily as needed (for shortness of breath).   [name] levothyroxine (SYNTHROID, LEVOTHROID) 25 MCG tablet Take 25 mcg by mouth daily.   [name] lisinopril (PRINIVIL,ZESTRIL) 10 MG tablet Take 10 mg by mouth daily.   [name] LORazepam (ATIVAN) 0.5 MG tablet Take 1 tablet (0.5 mg total) by mouth every 12 (twelve) hours as needed for up to 14 days for anxiety or sleep.  . metFORMIN (GLUCOPHAGE) 500 MG tablet Take 500 mg by mouth 2 (two) times daily with a meal.    . nitroGLYCERIN (NITROSTAT) 0.4 MG SL tablet Place 0.4 mg under the tongue every 5 (five) minutes as needed for chest pain.   . NON FORMULARY Diet Type:  NAS, Consistent Carbohydrate  . oxyCODONE (OXY IR/ROXICODONE) 5 MG immediate release tablet Take 1 tablet (5 mg total) by mouth every 6 (six) hours as needed for severe pain.  . pantoprazole (PROTONIX) 40 MG tablet Take 1 tablet (40 mg total) by mouth daily at 6 (six) AM.  . simvastatin (ZOCOR) 20 MG tablet Take 20 mg by mouth daily.  [name] UNABLE TO FIND CPAP from home and previous home settings while sleeping  . [DISCONTINUED] insulin NPH-regular Human (70-30) 100 UNIT/ML injection Inject 30 Units into the skin 2 (two) times daily. (Patient not taking: Reported on 03/10/2019)   No facility-administered encounter medications on file as of 03/10/2019.      SIGNIFICANT DIAGNOSTIC  EXAMS       ASSESSMENT/ PLAN:    MD is aware of resident's narcotic use and is in agreement with current plan of care. We will attempt to wean resident as apropriate   Synthia Innocent NP [facility]  Contact [phone] Monday through Friday 8am- 5pm  After hours call [phone]

## 2019-03-11 ENCOUNTER — Other Ambulatory Visit: Payer: Self-pay | Admitting: Adult Health

## 2019-03-11 MED ORDER — IPRATROPIUM-ALBUTEROL 0.5-2.5 (3) MG/3ML IN SOLN: 3.0000 mL | Freq: Four times a day (QID) | RESPIRATORY_TRACT | 0 refills | Status: AC | PRN

## 2019-03-11 MED ORDER — LEVOTHYROXINE SODIUM 25 MCG PO TABS: 25.0000 ug | ORAL_TABLET | Freq: Every day | ORAL | 0 refills | Status: DC

## 2019-03-11 MED ORDER — SIMVASTATIN 20 MG PO TABS: 20.0000 mg | ORAL_TABLET | Freq: Every day | ORAL | 0 refills | Status: DC

## 2019-03-11 MED ORDER — BENZONATATE 200 MG PO CAPS: 200.0000 mg | ORAL_CAPSULE | Freq: Three times a day (TID) | ORAL | 0 refills | Status: DC | PRN

## 2019-03-11 MED ORDER — INSULIN ISOPHANE & REGULAR (HUMAN 70-30)100 UNIT/ML KWIKPEN: 25.0000 [IU] | PEN_INJECTOR | Freq: Two times a day (BID) | SUBCUTANEOUS | 0 refills | Status: DC

## 2019-03-11 MED ORDER — CLOPIDOGREL BISULFATE 75 MG PO TABS: 75.0000 mg | ORAL_TABLET | Freq: Every day | ORAL | 0 refills | Status: DC

## 2019-03-11 MED ORDER — LISINOPRIL 10 MG PO TABS: 10.0000 mg | ORAL_TABLET | Freq: Every day | ORAL | 0 refills | Status: DC

## 2019-03-11 MED ORDER — FERROUS SULFATE 325 (65 FE) MG PO TABS: 325.0000 mg | ORAL_TABLET | Freq: Every day | ORAL | 0 refills | Status: DC

## 2019-03-11 MED ORDER — PANTOPRAZOLE SODIUM 40 MG PO TBEC: 40.0000 mg | DELAYED_RELEASE_TABLET | Freq: Every day | ORAL | 0 refills | Status: AC

## 2019-03-11 MED ORDER — OXYCODONE HCL 5 MG PO TABS: 5.0000 mg | ORAL_TABLET | Freq: Four times a day (QID) | ORAL | 0 refills | Status: DC | PRN

## 2019-03-11 MED ORDER — LORAZEPAM 0.5 MG PO TABS: 0.5000 mg | ORAL_TABLET | Freq: Two times a day (BID) | ORAL | 0 refills | Status: DC | PRN

## 2019-03-11 MED ORDER — GABAPENTIN 300 MG PO CAPS: 300.0000 mg | ORAL_CAPSULE | Freq: Two times a day (BID) | ORAL | 0 refills | Status: DC

## 2019-03-11 MED ORDER — AMLODIPINE BESYLATE 5 MG PO TABS: 5.0000 mg | ORAL_TABLET | Freq: Every day | ORAL | 0 refills | Status: DC

## 2019-03-11 MED ORDER — NITROGLYCERIN 0.4 MG SL SUBL: 0.4000 mg | SUBLINGUAL_TABLET | SUBLINGUAL | 0 refills | Status: DC | PRN

## 2019-03-11 MED ORDER — METFORMIN HCL 500 MG PO TABS: 500.0000 mg | ORAL_TABLET | Freq: Two times a day (BID) | ORAL | 0 refills | Status: DC

## 2019-03-11 MED ORDER — CITALOPRAM HYDROBROMIDE 20 MG PO TABS: 20.0000 mg | ORAL_TABLET | Freq: Every day | ORAL | 0 refills | Status: AC

## 2019-03-12 ENCOUNTER — Non-Acute Institutional Stay (SKILLED_NURSING_FACILITY): Payer: Medicare HMO | Admitting: Adult Health

## 2019-03-12 ENCOUNTER — Encounter: Payer: Self-pay | Admitting: Adult Health

## 2019-03-12 DIAGNOSIS — D509 Iron deficiency anemia, unspecified: Secondary | ICD-10-CM | POA: Insufficient documentation

## 2019-03-12 DIAGNOSIS — E1159 Type 2 diabetes mellitus with other circulatory complications: Secondary | ICD-10-CM | POA: Diagnosis not present

## 2019-03-12 DIAGNOSIS — G4733 Obstructive sleep apnea (adult) (pediatric): Secondary | ICD-10-CM | POA: Diagnosis not present

## 2019-03-12 DIAGNOSIS — E440 Moderate protein-calorie malnutrition: Secondary | ICD-10-CM | POA: Insufficient documentation

## 2019-03-12 DIAGNOSIS — I10 Essential (primary) hypertension: Secondary | ICD-10-CM | POA: Diagnosis not present

## 2019-03-12 DIAGNOSIS — J410 Simple chronic bronchitis: Secondary | ICD-10-CM

## 2019-03-12 NOTE — Progress Notes (Signed)
This encounter was created in error - please disregard.

## 2019-03-12 NOTE — Progress Notes (Signed)
Location:   [facility] Room Number: 152 D Place of Service:  SNF (31)    CODE STATUS: Full Code  Allergies  Allergen Reactions   Codeine Itching    Chief Complaint  Patient presents with   Discharge Note    Discharging to Home on 03/12/2019    HPI:  She is being discharged to home with home health for pt/ot/rn. She will need a standard wheelchair. She will need her prescriptions written and will need to follow up with her medical provider.   She had been hospitalized for increased weakness. She was admitted to this facility for short term rehab. She is ready to return home with continued home health therapy.    Past Medical History:  Diagnosis Date   Arthritis    CAD (coronary artery disease)    Multivessel status post CABG 2001   Chronic obstructive pulmonary disease (HCC)    Chronic pain    DDD (degenerative disc disease), lumbar    Diastolic CHF (HCC)    Essential hypertension    Hematuria    History of CVA (cerebrovascular accident)    2013 - lacunar infarct right thalamus - residual left side of  lip numb  and left eye vision worse (which corrented lens implant from cataract extraction)   History of MI (myocardial infarction)    July 2001   Hyperlipidemia    Hypothyroidism    Insulin dependent type 2 diabetes mellitus (HCC)    Lesion of bladder    Lower urinary tract symptoms (LUTS)    OSA (obstructive sleep apnea)    Peripheral neuropathy    Psoriasis    SUI (stress urinary incontinence, female)    Varicose veins     Past Surgical History:  Procedure Laterality Date   APPENDECTOMY  age 47   CARDIAC CATHETERIZATION  1991   No sig. cad   CARDIAC CATHETERIZATION  07-01-2000   high grade in-stent restenosis   CARDIAC CATHETERIZATION  04-30-2001   single native vessel cad/ graft patent   CARDIAC CATHETERIZATION  02-13-2006  dr Jenness Corner cad with total occluded LAD  and  70% ostial of small ramus/   grafts patent/  normal ef   CARDIAC CATHETERIZATION  01-20-2000  dr Clifton James   essentially unchanged;  severe single vessel cad with diffuse disease throughout CFX and RCA/ ef 66%;  chf with preserved LVSF   CARDIOVASCULAR STRESS TEST  08-28-2011   DR ROTHBART   NEGATIVE NUCLEAR STUDY/  GLOBAL LVSF/  EF 65%   CARPAL TUNNEL RELEASE Bilateral yrs ago   CATARACT EXTRACTION W/ INTRAOCULAR LENS  IMPLANT, BILATERAL  2013   CHOLECYSTECTOMY  1990   COLONOSCOPY N/A 07/26/2015   Procedure: COLONOSCOPY;  Surgeon: Malissa Hippo, MD;  [facility];  Service: Endoscopy;  Laterality: N/A;  1200   CORONARY ANGIOPLASTY  11-13-1999   balloon angioplasty to mLAD , d2 of LAD   CORONARY ANGIOPLASTY WITH STENT PLACEMENT  01-17-2000   PCI stenting to mLAD & D2 of LAD   CORONARY ARTERY BYPASS GRAFT  07-02-2000   DR Tressie Stalker   SVG to diagonal, LIMA to LAD   CYSTOSCOPY W/ RETROGRADES Bilateral 08/09/2014   Procedure: CYSTOSCOPY, BILATERAL RETROGRADE, HYDRODISTENSION, BLADDER BIOPSY WITH FULGERATION, INSTILL PYRIDIUM AND MARCAINE ;  Surgeon: Anner Crete, MD;  Location: [facility];  Service: Urology;  Laterality: Bilateral;   KNEE ARTHROSCOPY Left 10-18-2003   SYNOVECTOMY   LUMBAR SPINE SURGERY  x2    yrs ago   ORIF HUMERUS FRACTURE Right 04-18-2008   REPAIR FLEXOR TENDON HAND Right 09-20-2008   CARPI RADIALIS TENDON TRANSFER TO EXTENSOR TO INDEX, MIDDLE, RING , LITTLE FINGERS AND DIGITI MININI   ROTATOR CUFF REPAIR Right 1995   TOTAL ABDOMINAL HYSTERECTOMY W/ BILATERAL SALPINGOOPHORECTOMY  age 58   TRANSTHORACIC ECHOCARDIOGRAM  12-12-2013   MILD LVH/  EF 60-65%/  GRADE I DIASTOLIC DYSFUNCTION/  MILD LAE    Social History   Socioeconomic History   Marital status: Married    Spouse name: Not on file   Number of children: 2   Years of education: Not on file   Highest education level: Not on file  Occupational History    Employer: RETIRED  Social Needs     Financial resource strain: Patient refused   Food insecurity:    Worry: Patient refused    Inability: Patient refused   Transportation needs:    Medical: Patient refused    Non-medical: Patient refused  Tobacco Use   Smoking status: Never Smoker   Smokeless tobacco: Never Used  Substance and Sexual Activity   Alcohol use: No   Drug use: No   Sexual activity: Never  Lifestyle   Physical activity:    Days per week: Patient refused    Minutes per session: Patient refused   Stress: Patient refused  Relationships   Social connections:    Talks on phone: Patient refused    Gets together: Patient refused    Attends religious service: Patient refused    Active member of club or organization: Patient refused    Attends meetings of clubs or organizations: Patient refused    Relationship status: Patient refused   Intimate partner violence:    Fear of current or ex partner: Patient refused    Emotionally abused: Patient refused    Physically abused: Patient refused    Forced sexual activity: Patient refused  Other Topics Concern   Not on file  Social History Narrative   Not on file   Family History  Problem Relation Age of Onset   Hypertension Mother    Stroke Mother    Coronary artery disease Other        Multiple first and second-degree relatives   Aneurysm Other        Cerebral circulation   Colon cancer Sister    Stroke Sister    Coronary artery disease Sister    Clotting disorder Other        Children diagnosed with hypercoagulable state   Coronary artery disease Brother     VITAL SIGNS BP 114/67    Pulse 68    Temp 97.9 F (36.6 C)    Resp 20    Ht 5' 4.5" (1.638 m)    Wt 197 lb 3.2 oz (89.4 kg)    SpO2 94%    BMI 33.33 kg/m   Patient's Medications  New Prescriptions   No medications on file  Previous Medications   ACETAMINOPHEN (TYLENOL) 325 MG TABLET    Take 2 tablets (650 mg total) by mouth every 6 (six) hours as needed for mild  pain or headache (or Fever >/= 101).   AMINO ACIDS-PROTEIN HYDROLYS (FEEDING SUPPLEMENT, PRO-STAT SUGAR FREE 64,) LIQD    Take 30 mLs by mouth daily.   AMLODIPINE (NORVASC) 5 MG TABLET    Take 1 tablet (5 mg total) by mouth daily.   ASPIRIN EC 81 MG TABLET    Take 1 tablet (  81 mg total) by mouth daily.   BENZONATATE (TESSALON) 200 MG CAPSULE    Take 1 capsule (200 mg total) by mouth 3 (three) times daily as needed for cough.   CITALOPRAM (CELEXA) 20 MG TABLET    Take 1 tablet (20 mg total) by mouth daily.   CLOPIDOGREL (PLAVIX) 75 MG TABLET    Take 1 tablet (75 mg total) by mouth daily for 30 days.   DOCUSATE SODIUM (COLACE) 100 MG CAPSULE    Take 100 mg by mouth daily.   FERROUS SULFATE 325 (65 FE) MG TABLET    Take 1 tablet (325 mg total) by mouth daily with breakfast.   GABAPENTIN (NEURONTIN) 300 MG CAPSULE    Take 1 capsule (300 mg total) by mouth 2 (two) times daily.   INSULIN ISOPHANE & REGULAR HUMAN (HUMULIN 70/30 KWIKPEN) (70-30) 100 UNIT/ML PEN    Inject 25 Units into the skin 2 (two) times daily.   IPRATROPIUM-ALBUTEROL (DUONEB) 0.5-2.5 (3) MG/3ML SOLN    Inhale 3 mLs into the lungs 4 (four) times daily as needed (for shortness of breath).   LEVOTHYROXINE (SYNTHROID, LEVOTHROID) 25 MCG TABLET    Take 1 tablet (25 mcg total) by mouth daily.   LISINOPRIL (PRINIVIL,ZESTRIL) 10 MG TABLET    Take 1 tablet (10 mg total) by mouth daily.   LORAZEPAM (ATIVAN) 0.5 MG TABLET    Take 1 tablet (0.5 mg total) by mouth every 12 (twelve) hours as needed for anxiety or sleep.   METFORMIN (GLUCOPHAGE) 500 MG TABLET    Take 1 tablet (500 mg total) by mouth 2 (two) times daily with a meal.   NITROGLYCERIN (NITROSTAT) 0.4 MG SL TABLET    Place 1 tablet (0.4 mg total) under the tongue every 5 (five) minutes as needed for chest pain.   NON FORMULARY    Diet Type:  NAS, Consistent Carbohydrate   OXYCODONE (OXY IR/ROXICODONE) 5 MG IMMEDIATE RELEASE TABLET    Take 1 tablet (5 mg total) by mouth every 6 (six) hours  as needed for severe pain.   PANTOPRAZOLE (PROTONIX) 40 MG TABLET    Take 1 tablet (40 mg total) by mouth daily at 6 (six) AM.   SIMVASTATIN (ZOCOR) 20 MG TABLET    Take 1 tablet (20 mg total) by mouth daily.   UNABLE TO FIND    CPAP from home and previous home settings while sleeping  Modified Medications   No medications on file  Discontinued Medications   No medications on file     SIGNIFICANT DIAGNOSTIC EXAMS   LABS REVIEWED: PREVIOUS   02-12-19: tsh 0.959 02-18-19: hgb a1c 8.5; chol 166; ldl 106; trig 90; hdl 42 02-23-19: wbc  14.3; hgb 12.2; hct 42.0; mcv 80.3; plt 321 glucose 235; bun 21; creat 0.70; k+ 3.9; na++ 137; ca 8.5  03-05-19: wbc 12.1; hgb 11.1; hct 37.8; mcv 83.8 plt 263 influenza neg; blood culture: no growth 03-08-19: wbc 7.2; hgb 11.6; hct 39.4; mcv 83.2; plt 252; glucose 133; bun 14; creat 0.78; k+ 4.5; na++ 141; ca 9.3; liver normal albumin 3.3 iron 16; tibc 292; folate 29.8; ferritin 11  NO NEW LABS.    Review of Systems  Constitutional: Negative for malaise/fatigue.  Respiratory: Negative for cough and shortness of breath.   Cardiovascular: Negative for chest pain, palpitations and leg swelling.  Gastrointestinal: Negative for abdominal pain, constipation and heartburn.  Musculoskeletal: Negative for back pain, joint pain and myalgias.  Skin: Negative.   Neurological: Negative for dizziness.  Psychiatric/Behavioral: The patient is not nervous/anxious.     Physical Exam Constitutional:      General: She is not in acute distress.    Appearance: She is well-developed. She is obese. She is not diaphoretic.  Eyes:     Comments: History of bilateral cataract with lens implants     Neck:     Musculoskeletal: Neck supple.     Thyroid: No thyromegaly.  Cardiovascular:     Rate and Rhythm: Normal rate and regular rhythm.     Pulses: Normal pulses.     Heart sounds: Normal heart sounds.     Comments: History of CABG  Pulmonary:     Effort: Pulmonary effort  is normal. No respiratory distress.     Breath sounds: Normal breath sounds.  Abdominal:     General: Bowel sounds are normal. There is no distension.     Palpations: Abdomen is soft.     Tenderness: There is no abdominal tenderness.  Musculoskeletal:     Right lower leg: No edema.     Left lower leg: No edema.     Comments: History of lumbar back surgery History of ORIF right humerus Is able to move all extremities   Lymphadenopathy:     Cervical: No cervical adenopathy.  Skin:    General: Skin is warm and dry.  Neurological:     Mental Status: She is alert and oriented to person, place, and time.  Psychiatric:        Mood and Affect: Mood normal.      ASSESSMENT/ PLAN:  Patient is being discharged with the following home health services:  Pt/ot/rn: to evaluate and treat as indicated for gait balance strength adl training and medication management   Patient is being discharged with the following durable medical equipment:  Standard wheelchair with elevated leg rests; cushions; anti-tippers; brake extensions to allow her to maintain her current level of independence with her adls which cannot be achieved with a walker can self propel   Patient has been advised to f/u with their PCP in 1-2 weeks to bring them up to date on their rehab stay.  Social services at facility was responsible for arranging this appointment.  Pt was provided with a 30 day supply of prescriptions for medications and refills must be obtained from their PCP.  For controlled substances, a more limited supply may be provided adequate until PCP appointment only.   A 30 day supply of her prescription medications have been sent to [facility] [address] With # 10 oxycodone 5 mg tabs.   Time spent with patient and discharge process; 40 minutes: medications home health needs; dme.    Synthia Innocent NP [facility]  Contact [phone] Monday through Friday 8am- 5pm  After hours call [phone]

## 2019-03-17 ENCOUNTER — Other Ambulatory Visit: Payer: Self-pay

## 2019-03-17 ENCOUNTER — Encounter (HOSPITAL_COMMUNITY): Payer: Self-pay | Admitting: Emergency Medicine

## 2019-03-17 ENCOUNTER — Emergency Department (HOSPITAL_COMMUNITY): Payer: Medicare HMO

## 2019-03-17 ENCOUNTER — Inpatient Hospital Stay (HOSPITAL_COMMUNITY)
Admission: EM | Admit: 2019-03-17 | Discharge: 2019-03-20 | DRG: 640 | Disposition: A | Discharge status: Home Health | Payer: Medicare HMO | Attending: Internal Medicine | Admitting: Internal Medicine

## 2019-03-17 DIAGNOSIS — Z7902 Long term (current) use of antithrombotics/antiplatelets: Secondary | ICD-10-CM | POA: Diagnosis not present

## 2019-03-17 DIAGNOSIS — E1165 Type 2 diabetes mellitus with hyperglycemia: Secondary | ICD-10-CM | POA: Diagnosis present

## 2019-03-17 DIAGNOSIS — R400 Somnolence: Secondary | ICD-10-CM

## 2019-03-17 DIAGNOSIS — I2510 Atherosclerotic heart disease of native coronary artery without angina pectoris: Secondary | ICD-10-CM | POA: Diagnosis present

## 2019-03-17 DIAGNOSIS — G4733 Obstructive sleep apnea (adult) (pediatric): Secondary | ICD-10-CM | POA: Diagnosis present

## 2019-03-17 DIAGNOSIS — Z823 Family history of stroke: Secondary | ICD-10-CM

## 2019-03-17 DIAGNOSIS — F1190 Opioid use, unspecified, uncomplicated: Secondary | ICD-10-CM

## 2019-03-17 DIAGNOSIS — M25552 Pain in left hip: Secondary | ICD-10-CM | POA: Diagnosis not present

## 2019-03-17 DIAGNOSIS — Z8673 Personal history of transient ischemic attack (TIA), and cerebral infarction without residual deficits: Secondary | ICD-10-CM | POA: Diagnosis not present

## 2019-03-17 DIAGNOSIS — E039 Hypothyroidism, unspecified: Secondary | ICD-10-CM | POA: Diagnosis present

## 2019-03-17 DIAGNOSIS — G9341 Metabolic encephalopathy: Secondary | ICD-10-CM | POA: Diagnosis present

## 2019-03-17 DIAGNOSIS — E860 Dehydration: Secondary | ICD-10-CM | POA: Diagnosis not present

## 2019-03-17 DIAGNOSIS — N390 Urinary tract infection, site not specified: Secondary | ICD-10-CM | POA: Diagnosis present

## 2019-03-17 DIAGNOSIS — G894 Chronic pain syndrome: Secondary | ICD-10-CM | POA: Diagnosis present

## 2019-03-17 DIAGNOSIS — J449 Chronic obstructive pulmonary disease, unspecified: Secondary | ICD-10-CM | POA: Diagnosis present

## 2019-03-17 DIAGNOSIS — T50915A Adverse effect of multiple unspecified drugs, medicaments and biological substances, initial encounter: Secondary | ICD-10-CM | POA: Diagnosis present

## 2019-03-17 DIAGNOSIS — E1142 Type 2 diabetes mellitus with diabetic polyneuropathy: Secondary | ICD-10-CM | POA: Diagnosis present

## 2019-03-17 DIAGNOSIS — I110 Hypertensive heart disease with heart failure: Secondary | ICD-10-CM | POA: Diagnosis present

## 2019-03-17 DIAGNOSIS — E785 Hyperlipidemia, unspecified: Secondary | ICD-10-CM | POA: Diagnosis present

## 2019-03-17 DIAGNOSIS — G92 Toxic encephalopathy: Secondary | ICD-10-CM | POA: Diagnosis present

## 2019-03-17 DIAGNOSIS — S76012A Strain of muscle, fascia and tendon of left hip, initial encounter: Secondary | ICD-10-CM | POA: Diagnosis present

## 2019-03-17 DIAGNOSIS — R296 Repeated falls: Secondary | ICD-10-CM | POA: Diagnosis present

## 2019-03-17 DIAGNOSIS — Z8249 Family history of ischemic heart disease and other diseases of the circulatory system: Secondary | ICD-10-CM

## 2019-03-17 DIAGNOSIS — Z951 Presence of aortocoronary bypass graft: Secondary | ICD-10-CM | POA: Diagnosis not present

## 2019-03-17 DIAGNOSIS — Z9119 Patient's noncompliance with other medical treatment and regimen: Secondary | ICD-10-CM | POA: Diagnosis not present

## 2019-03-17 DIAGNOSIS — I10 Essential (primary) hypertension: Secondary | ICD-10-CM

## 2019-03-17 DIAGNOSIS — I252 Old myocardial infarction: Secondary | ICD-10-CM

## 2019-03-17 DIAGNOSIS — IMO0002 Reserved for concepts with insufficient information to code with codable children: Secondary | ICD-10-CM | POA: Diagnosis present

## 2019-03-17 DIAGNOSIS — Z7982 Long term (current) use of aspirin: Secondary | ICD-10-CM

## 2019-03-17 DIAGNOSIS — Z79890 Hormone replacement therapy (postmenopausal): Secondary | ICD-10-CM

## 2019-03-17 DIAGNOSIS — I5032 Chronic diastolic (congestive) heart failure: Secondary | ICD-10-CM | POA: Diagnosis present

## 2019-03-17 DIAGNOSIS — Z87898 Personal history of other specified conditions: Secondary | ICD-10-CM

## 2019-03-17 DIAGNOSIS — M25559 Pain in unspecified hip: Secondary | ICD-10-CM

## 2019-03-17 DIAGNOSIS — Z794 Long term (current) use of insulin: Secondary | ICD-10-CM

## 2019-03-17 LAB — CBC WITH DIFFERENTIAL/PLATELET
Abs Immature Granulocytes: 0.02 10*3/uL (ref 0.00–0.07)
Basophils Absolute: 0 10*3/uL (ref 0.0–0.1)
Basophils Relative: 0 %
Eosinophils Absolute: 0.3 10*3/uL (ref 0.0–0.5)
Eosinophils Relative: 4 %
HCT: 42.1 % (ref 36.0–46.0)
Hemoglobin: 12.5 g/dL (ref 12.0–15.0)
Immature Granulocytes: 0 %
Lymphocytes Relative: 32 %
Lymphs Abs: 2.5 10*3/uL (ref 0.7–4.0)
MCH: 24.8 pg — ABNORMAL LOW (ref 26.0–34.0)
MCHC: 29.7 g/dL — ABNORMAL LOW (ref 30.0–36.0)
MCV: 83.5 fL (ref 80.0–100.0)
Monocytes Absolute: 0.8 10*3/uL (ref 0.1–1.0)
Monocytes Relative: 11 %
NEUTROS ABS: 4 10*3/uL (ref 1.7–7.7)
Neutrophils Relative %: 53 %
PLATELETS: 361 10*3/uL (ref 150–400)
RBC: 5.04 MIL/uL (ref 3.87–5.11)
RDW: 17.8 % — ABNORMAL HIGH (ref 11.5–15.5)
WBC: 7.6 10*3/uL (ref 4.0–10.5)
nRBC: 0 % (ref 0.0–0.2)

## 2019-03-17 LAB — RAPID URINE DRUG SCREEN, HOSP PERFORMED
Amphetamines: NOT DETECTED
Barbiturates: NOT DETECTED
Benzodiazepines: POSITIVE — AB
Cocaine: NOT DETECTED
Opiates: POSITIVE — AB
Tetrahydrocannabinol: NOT DETECTED

## 2019-03-17 LAB — COMPREHENSIVE METABOLIC PANEL
ALT: 15 U/L (ref 0–44)
ANION GAP: 9 (ref 5–15)
AST: 17 U/L (ref 15–41)
Albumin: 3.8 g/dL (ref 3.5–5.0)
Alkaline Phosphatase: 52 U/L (ref 38–126)
BUN: 10 mg/dL (ref 8–23)
CO2: 26 mmol/L (ref 22–32)
Calcium: 9.5 mg/dL (ref 8.9–10.3)
Chloride: 105 mmol/L (ref 98–111)
Creatinine, Ser: 0.74 mg/dL (ref 0.44–1.00)
GFR calc Af Amer: >60 mL/min (ref >60)
GFR calc non Af Amer: >60 mL/min (ref >60)
GLUCOSE: 129 mg/dL — AB (ref 70–99)
Potassium: 4.4 mmol/L (ref 3.5–5.1)
Sodium: 140 mmol/L (ref 135–145)
Total Bilirubin: 0.6 mg/dL (ref 0.3–1.2)
Total Protein: 7.1 g/dL (ref 6.5–8.1)

## 2019-03-17 LAB — URINALYSIS, ROUTINE W REFLEX MICROSCOPIC
Bilirubin Urine: NEGATIVE
Glucose, UA: NEGATIVE mg/dL
Hgb urine dipstick: NEGATIVE
Ketones, ur: 5 mg/dL — AB
Nitrite: NEGATIVE
Protein, ur: 30 mg/dL — AB
Specific Gravity, Urine: 1.018 (ref 1.005–1.030)
pH: 5 (ref 5.0–8.0)

## 2019-03-17 LAB — BLOOD GAS, ARTERIAL
Acid-Base Excess: 1.2 mmol/L (ref 0.0–2.0)
BICARBONATE: 25.2 mmol/L (ref 20.0–28.0)
FIO2: 32
O2 Saturation: 93.9 %
Patient temperature: 37
pCO2 arterial: 45.4 mmHg (ref 32.0–48.0)
pH, Arterial: 7.374 (ref 7.350–7.450)
pO2, Arterial: 77.9 mmHg — ABNORMAL LOW (ref 83.0–108.0)

## 2019-03-17 LAB — LACTIC ACID, PLASMA
LACTIC ACID, VENOUS: 1.4 mmol/L (ref 0.5–1.9)
Lactic Acid, Venous: 1.2 mmol/L (ref 0.5–1.9)

## 2019-03-17 LAB — GLUCOSE, CAPILLARY
Glucose-Capillary: 102 mg/dL — ABNORMAL HIGH (ref 70–99)
Glucose-Capillary: 122 mg/dL — ABNORMAL HIGH (ref 70–99)

## 2019-03-17 LAB — TROPONIN I: Troponin I: <0.03 ng/mL (ref <0.03)

## 2019-03-17 LAB — PROTIME-INR
INR: 1 (ref 0.8–1.2)
Prothrombin Time: 13.5 seconds (ref 11.4–15.2)

## 2019-03-17 MED ORDER — SODIUM CHLORIDE 0.9 % IV BOLUS
500.0000 mL | Freq: Once | INTRAVENOUS | Status: AC
  Administered 2019-03-17: 500 mL via INTRAVENOUS

## 2019-03-17 MED ORDER — ONDANSETRON HCL 4 MG PO TABS: 4.0000 mg | ORAL_TABLET | Freq: Four times a day (QID) | ORAL | Status: DC | PRN

## 2019-03-17 MED ORDER — LEVOTHYROXINE SODIUM 25 MCG PO TABS
25.0000 ug | ORAL_TABLET | Freq: Every day | ORAL | Status: DC
  Administered 2019-03-18 – 2019-03-20 (×3): 25 ug via ORAL
  Filled 2019-03-17 (×3): qty 1

## 2019-03-17 MED ORDER — ASPIRIN EC 81 MG PO TBEC
81.0000 mg | DELAYED_RELEASE_TABLET | Freq: Every day | ORAL | Status: DC
  Administered 2019-03-17 – 2019-03-20 (×4): 81 mg via ORAL
  Filled 2019-03-17 (×4): qty 1

## 2019-03-17 MED ORDER — IOHEXOL 350 MG/ML SOLN
100.0000 mL | Freq: Once | INTRAVENOUS | Status: AC | PRN
  Administered 2019-03-17: 100 mL via INTRAVENOUS

## 2019-03-17 MED ORDER — NALOXONE HCL 0.4 MG/ML IJ SOLN: 0.4000 mg | INTRAMUSCULAR | Status: DC | PRN

## 2019-03-17 MED ORDER — SODIUM CHLORIDE 0.9 % IV SOLN
1.0000 g | INTRAVENOUS | Status: DC
  Administered 2019-03-17 – 2019-03-18 (×2): 1 g via INTRAVENOUS
  Filled 2019-03-17 (×2): qty 10

## 2019-03-17 MED ORDER — SIMVASTATIN 20 MG PO TABS
20.0000 mg | ORAL_TABLET | Freq: Every day | ORAL | Status: DC
  Administered 2019-03-17 – 2019-03-19 (×3): 20 mg via ORAL
  Filled 2019-03-17 (×3): qty 1

## 2019-03-17 MED ORDER — CITALOPRAM HYDROBROMIDE 20 MG PO TABS
20.0000 mg | ORAL_TABLET | Freq: Every day | ORAL | Status: DC
  Administered 2019-03-18 – 2019-03-20 (×3): 20 mg via ORAL
  Filled 2019-03-17 (×3): qty 1

## 2019-03-17 MED ORDER — ONDANSETRON HCL 4 MG/2ML IJ SOLN: 4.0000 mg | Freq: Four times a day (QID) | INTRAMUSCULAR | Status: DC | PRN

## 2019-03-17 MED ORDER — CLOPIDOGREL BISULFATE 75 MG PO TABS
75.0000 mg | ORAL_TABLET | Freq: Every day | ORAL | Status: DC
  Administered 2019-03-18 – 2019-03-20 (×3): 75 mg via ORAL
  Filled 2019-03-17 (×3): qty 1

## 2019-03-17 MED ORDER — INSULIN ASPART 100 UNIT/ML ~~LOC~~ SOLN
0.0000 [IU] | Freq: Three times a day (TID) | SUBCUTANEOUS | Status: DC
  Administered 2019-03-18 (×2): 2 [IU] via SUBCUTANEOUS
  Administered 2019-03-19 (×2): 3 [IU] via SUBCUTANEOUS
  Administered 2019-03-19: 2 [IU] via SUBCUTANEOUS
  Administered 2019-03-20: 3 [IU] via SUBCUTANEOUS

## 2019-03-17 MED ORDER — ALBUTEROL SULFATE (2.5 MG/3ML) 0.083% IN NEBU: 2.5000 mg | INHALATION_SOLUTION | RESPIRATORY_TRACT | Status: DC | PRN

## 2019-03-17 MED ORDER — NALOXONE HCL 0.4 MG/ML IJ SOLN
0.4000 mg | Freq: Once | INTRAMUSCULAR | Status: AC
  Administered 2019-03-17: 0.4 mg via INTRAVENOUS
  Filled 2019-03-17: qty 1

## 2019-03-17 MED ORDER — SODIUM CHLORIDE 0.9 % IV SOLN
INTRAVENOUS | Status: DC
  Administered 2019-03-17 (×2): via INTRAVENOUS

## 2019-03-17 MED ORDER — ACETAMINOPHEN 325 MG PO TABS
650.0000 mg | ORAL_TABLET | Freq: Four times a day (QID) | ORAL | Status: DC | PRN
  Administered 2019-03-17 – 2019-03-19 (×4): 650 mg via ORAL
  Filled 2019-03-17 (×4): qty 2

## 2019-03-17 MED ORDER — DOCUSATE SODIUM 100 MG PO CAPS
100.0000 mg | ORAL_CAPSULE | Freq: Every day | ORAL | Status: DC
  Administered 2019-03-18 – 2019-03-20 (×3): 100 mg via ORAL
  Filled 2019-03-17 (×3): qty 1

## 2019-03-17 MED ORDER — FERROUS SULFATE 325 (65 FE) MG PO TABS
325.0000 mg | ORAL_TABLET | Freq: Every day | ORAL | Status: DC
  Administered 2019-03-18 – 2019-03-20 (×3): 325 mg via ORAL
  Filled 2019-03-17 (×3): qty 1

## 2019-03-17 MED ORDER — ENOXAPARIN SODIUM 40 MG/0.4ML ~~LOC~~ SOLN: 40.0000 mg | SUBCUTANEOUS | Status: DC

## 2019-03-17 NOTE — ED Notes (Signed)
Pt's son, Glendale Chard, [phone].

## 2019-03-17 NOTE — ED Notes (Signed)
Pt to CT at this time.

## 2019-03-17 NOTE — ED Notes (Signed)
Pt's granddaughter called, [name] [name], [phone]) and told secretary, Misty Stanley, that she was concerned with pt's living conditions. Notified Thereasa Distance with social work and he will check with granddaughter. Pt is not oriented but did not appear to have any noted signs of elder abuse. No visitors at this time d/t restrictions.

## 2019-03-17 NOTE — ED Notes (Addendum)
ED TO INPATIENT HANDOFF REPORT  ED Nurse Name and Phone #: Maralyn Sago [phone]  S Name/Age/Gender [name] [name] 83 y.o. female Room/Bed: APA01/APA01  Code Status   Code Status: Prior  Home/SNF/Other Home Patient oriented to: self Is this baseline? 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 Lives at home with her son. Was seen last month for a UTI and never took any antibx. Unknown if she took too much or none of her medications. Normally pt is able to manage her own medications and can perform ADL's.    Allergies Allergies  Allergen Reactions  . Codeine Itching    Level of Care/Admitting Diagnosis ED Disposition    ED Disposition Condition Comment   Admit  Hospital Area: [facility] [[id number]]  Level of Care: Med-Surg [16]  Diagnosis: Acute metabolic encephalopathy [[id number]]  Admitting Physician: Angie Fava [[id number]]  Attending Physician: Angie Fava [[id number]]  Estimated length of stay: past midnight tomorrow  Certification:: I certify this patient will need inpatient services for at least 2 midnights  PT Class (Do Not Modify): Inpatient [101]  PT Acc Code (Do Not Modify): Private [1]       B Medical/Surgery History Past Medical History:  Diagnosis Date  . Arthritis   . CAD (coronary artery disease)    Multivessel status post CABG 2001  . Chronic obstructive pulmonary disease (HCC)   . Chronic pain   . DDD (degenerative disc disease), lumbar   . Diastolic CHF (HCC)   . Essential hypertension   . Hematuria   . History of CVA (cerebrovascular accident)    2013 - lacunar infarct right thalamus - residual left side of  lip numb  and left eye vision worse (which corrented lens implant from cataract extraction)  . History of MI (myocardial infarction)    July 2001  . Hyperlipidemia   . Hypothyroidism   . Insulin dependent type 2 diabetes  mellitus (HCC)   . Lesion of bladder   . Lower urinary tract symptoms (LUTS)   . OSA (obstructive sleep apnea)   . Peripheral neuropathy   . Psoriasis   . SUI (stress urinary incontinence, female)   . Varicose veins    Past Surgical History:  Procedure Laterality Date  . APPENDECTOMY  age 26  . CARDIAC CATHETERIZATION  1991   No sig. cad  . CARDIAC CATHETERIZATION  07-01-2000   high grade in-stent restenosis  . CARDIAC CATHETERIZATION  04-30-2001   single native vessel cad/ graft patent  . CARDIAC CATHETERIZATION  02-13-2006  dr [place] cad with total occluded LAD  and  70% ostial of small ramus/  grafts patent/  normal ef  . CARDIAC CATHETERIZATION  01-20-2000  dr Clifton James   essentially unchanged;  severe single vessel cad with diffuse disease throughout CFX and RCA/ ef 66%;  chf with preserved LVSF  . CARDIOVASCULAR STRESS TEST  08-28-2011   DR ROTHBART   NEGATIVE NUCLEAR STUDY/  GLOBAL LVSF/  EF 65%  . CARPAL TUNNEL RELEASE Bilateral yrs ago  . CATARACT EXTRACTION W/ INTRAOCULAR LENS  IMPLANT, BILATERAL  2013  . CHOLECYSTECTOMY  1990  . COLONOSCOPY N/A 07/26/2015   Procedure: COLONOSCOPY;  Surgeon: Malissa Hippo, MD;  [facility];  Service: Endoscopy;  Laterality: N/A;  1200  . CORONARY ANGIOPLASTY  11-13-1999   balloon angioplasty to mLAD , d2 of LAD  .  CORONARY ANGIOPLASTY WITH STENT PLACEMENT  01-17-2000   PCI stenting to mLAD & D2 of LAD  . CORONARY ARTERY BYPASS GRAFT  07-02-2000   DR Tressie StalkerLARENCE OWEN   SVG to diagonal, LIMA to LAD  . CYSTOSCOPY W/ RETROGRADES Bilateral 08/09/2014   Procedure: CYSTOSCOPY, BILATERAL RETROGRADE, HYDRODISTENSION, BLADDER BIOPSY WITH FULGERATION, INSTILL PYRIDIUM AND MARCAINE ;  Surgeon: Anner CreteJohn J Wrenn, MD;  [facility];  Service: Urology;  Laterality: Bilateral;  . KNEE ARTHROSCOPY Left 10-18-2003   SYNOVECTOMY  . LUMBAR SPINE SURGERY  x2    yrs ago  . ORIF HUMERUS FRACTURE Right 04-18-2008  . REPAIR  FLEXOR TENDON HAND Right 09-20-2008   CARPI RADIALIS TENDON TRANSFER TO EXTENSOR TO INDEX, MIDDLE, RING , LITTLE FINGERS AND DIGITI MININI  . ROTATOR CUFF REPAIR Right 1995  . TOTAL ABDOMINAL HYSTERECTOMY W/ BILATERAL SALPINGOOPHORECTOMY  age 89  . TRANSTHORACIC ECHOCARDIOGRAM  12-12-2013   MILD LVH/  EF 60-65%/  GRADE I DIASTOLIC DYSFUNCTION/  MILD LAE     A IV Location/Drains/Wounds Patient Lines/Drains/Airways Status   Active Line/Drains/Airways    Name:   Placement date:   Placement time:   Site:   Days:   Peripheral IV 03/17/19 Right Antecubital   03/17/19    1240    Antecubital   less than 1   Peripheral IV 03/17/19 Left Antecubital   03/17/19    1230    Antecubital   less than 1          Intake/Output Last 24 hours  Intake/Output Summary (Last 24 hours) at 03/17/2019 1610 Last data filed at 03/17/2019 1609 Gross per 24 hour  Intake 580.49 ml  Output -  Net 580.49 ml    Labs/Imaging Results for orders placed or performed during the hospital encounter of 03/17/19 (from the past 48 hour(s))  Comprehensive metabolic panel     Status: Abnormal   Collection Time: 03/17/19 12:03 PM  Result Value Ref Range   Sodium 140 135 - 145 mmol/L   Potassium 4.4 3.5 - 5.1 mmol/L   Chloride 105 98 - 111 mmol/L   CO2 26 22 - 32 mmol/L   Glucose, Bld 129 (H) 70 - 99 mg/dL   BUN 10 8 - 23 mg/dL   Creatinine, Ser 0.980.74 0.44 - 1.00 mg/dL   Calcium 9.5 8.9 - 11.910.3 mg/dL   Total Protein 7.1 6.5 - 8.1 g/dL   Albumin 3.8 3.5 - 5.0 g/dL   AST 17 15 - 41 U/L   ALT 15 0 - 44 U/L   Alkaline Phosphatase 52 38 - 126 U/L   Total Bilirubin 0.6 0.3 - 1.2 mg/dL   GFR calc non Af Amer >60 >60 mL/min   GFR calc Af Amer >60 >60 mL/min   Anion gap 9 5 - 15    Comment: Performed at [facility], [address] [place], [place] [address]  Troponin I - Once     Status: None   Collection Time: 03/17/19 12:03 PM  Result Value Ref Range   Troponin I <0.03 <0.03 ng/mL    Comment: Performed at [facility], [address] [place], [place] [address]  Lactic acid, plasma     Status: None   Collection Time: 03/17/19 12:03 PM  Result Value Ref Range   Lactic Acid, Venous 1.2 0.5 - 1.9 mmol/L    Comment: Performed at [facility], [address] [place], [place] [address]  CBC with Differential     Status: Abnormal  Collection Time: 03/17/19 12:03 PM  Result Value Ref Range   WBC 7.6 4.0 - 10.5 K/uL   RBC 5.04 3.87 - 5.11 MIL/uL   Hemoglobin 12.5 12.0 - 15.0 g/dL   HCT 78.2 95.6 - 21.3 %   MCV 83.5 80.0 - 100.0 fL   MCH 24.8 (L) 26.0 - 34.0 pg   MCHC 29.7 (L) 30.0 - 36.0 g/dL   RDW 08.6 (H) 57.8 - 46.9 %   Platelets 361 150 - 400 K/uL   nRBC 0.0 0.0 - 0.2 %   Neutrophils Relative % 53 %   Neutro Abs 4.0 1.7 - 7.7 K/uL   Lymphocytes Relative 32 %   Lymphs Abs 2.5 0.7 - 4.0 K/uL   Monocytes Relative 11 %   Monocytes Absolute 0.8 0.1 - 1.0 K/uL   Eosinophils Relative 4 %   Eosinophils Absolute 0.3 0.0 - 0.5 K/uL   Basophils Relative 0 %   Basophils Absolute 0.0 0.0 - 0.1 K/uL   Immature Granulocytes 0 %   Abs Immature Granulocytes 0.02 0.00 - 0.07 K/uL    Comment: Performed at [facility], [address], [place], [place] [address]  Protime-INR     Status: None   Collection Time: 03/17/19 12:03 PM  Result Value Ref Range   Prothrombin Time 13.5 11.4 - 15.2 seconds   INR 1.0 0.8 - 1.2    Comment: (NOTE) INR goal varies based on device and disease states. Performed at [facility], [address] [place], [place] [address]   Urine rapid drug screen (hosp performed)     Status: Abnormal   Collection Time: 03/17/19 12:47 PM  Result Value Ref Range   Opiates POSITIVE (A) NONE DETECTED   Cocaine NONE DETECTED NONE DETECTED   Benzodiazepines POSITIVE (A) NONE DETECTED   Amphetamines NONE DETECTED NONE DETECTED   Tetrahydrocannabinol NONE DETECTED NONE DETECTED   Barbiturates NONE DETECTED NONE DETECTED    Comment: (NOTE) DRUG SCREEN FOR MEDICAL PURPOSES ONLY.  IF CONFIRMATION IS  NEEDED FOR ANY PURPOSE, NOTIFY LAB WITHIN 5 DAYS. LOWEST DETECTABLE LIMITS FOR URINE DRUG SCREEN Drug Class                     Cutoff (ng/mL) Amphetamine and metabolites    1000 Barbiturate and metabolites    200 Benzodiazepine                 200 Tricyclics and metabolites     300 Opiates and metabolites        300 Cocaine and metabolites        300 THC                            50 Performed at [facility], [address] [place], [place] [address]   Urinalysis, Routine w reflex microscopic     Status: Abnormal   Collection Time: 03/17/19 12:47 PM  Result Value Ref Range   Color, Urine YELLOW YELLOW   APPearance CLOUDY (A) CLEAR   Specific Gravity, Urine 1.018 1.005 - 1.030   pH 5.0 5.0 - 8.0   Glucose, UA NEGATIVE NEGATIVE mg/dL   Hgb urine dipstick NEGATIVE NEGATIVE   Bilirubin Urine NEGATIVE NEGATIVE   Ketones, ur 5 (A) NEGATIVE mg/dL   Protein, ur 30 (A) NEGATIVE mg/dL   Nitrite NEGATIVE NEGATIVE   Leukocytes,Ua MODERATE (A) NEGATIVE   RBC / HPF 6-10 0 - 5 RBC/hpf   WBC,  UA >50 (H) 0 - 5 WBC/hpf   Bacteria, UA RARE (A) NONE SEEN   Squamous Epithelial / LPF 0-5 0 - 5   WBC Clumps PRESENT    Mucus PRESENT    Hyaline Casts, UA PRESENT     Comment: Performed at [facility], [address], [place], [place] [address]  Lactic acid, plasma     Status: None   Collection Time: 03/17/19  2:22 PM  Result Value Ref Range   Lactic Acid, Venous 1.4 0.5 - 1.9 mmol/L    Comment: Performed at [facility], [address], [place], [place] [address]  Blood gas, arterial     Status: Abnormal   Collection Time: 03/17/19  2:58 PM  Result Value Ref Range   FIO2 32.00    pH, Arterial 7.374 7.350 - 7.450   pCO2 arterial 45.4 32.0 - 48.0 mmHg   pO2, Arterial 77.9 (L) 83.0 - 108.0 mmHg   Bicarbonate 25.2 20.0 - 28.0 mmol/L   Acid-Base Excess 1.2 0.0 - 2.0 mmol/L   O2 Saturation 93.9 %   Patient temperature 37.0    Allens test (pass/fail) PASS PASS    Comment: Performed at [facility], [address] [place], [place] [address]   Dg Chest 1 View  Result Date: 03/17/2019 CLINICAL DATA:  Altered mental status. EXAM: CHEST  1 VIEW COMPARISON:  02/12/2019 FINDINGS: Postsurgical changes from CABG. The cardiac silhouette is enlarged. There is an apparent widening of the mediastinum, when compared to patient's prior radiographs. There is no evidence of focal airspace consolidation, or pneumothorax. Possible right pleural effusion. Osseous structures are without acute abnormality. Soft tissues are grossly normal. IMPRESSION: 1. Apparent widening of the mediastinum, when compared to patient's prior radiographs. This may be due to poor inspiratory effort, however vascular anomaly can not be excluded. Please correlate to patient's symptomatology. If the patient displays signs of acute aortic syndrome, then CT angiogram of the chest should be performed. Otherwise PA and lateral radiograph the chest may be considered. 2. Possible right pleural effusion. These results were called by telephone at the time of interpretation on 03/17/2019 at 1:43 pm to Dr. Samuel Jester , who verbally acknowledged these results. Electronically Signed   By: Ted Mcalpine M.D.   On: 03/17/2019 13:45   Ct Head Wo Contrast  Result Date: 03/17/2019 CLINICAL DATA:  Explain confusion. EXAM: CT HEAD WITHOUT CONTRAST CT CERVICAL SPINE WITHOUT CONTRAST TECHNIQUE: Multidetector CT imaging of the head and cervical spine was performed following the standard protocol without intravenous contrast. Multiplanar CT image reconstructions of the cervical spine were also generated. COMPARISON:  Brain MRI 02/17/2019, cervical spine MRI 02/18/2019 FINDINGS: CT HEAD FINDINGS Brain: No evidence of acute infarction, hemorrhage, hydrocephalus, extra-axial collection or mass lesion/mass effect. Moderate brain parenchymal volume loss and deep white matter microangiopathy. Vascular: Calcific atherosclerotic disease of the intra cavernous  carotid arteries. Skull: Normal. Negative for fracture or focal lesion. Sinuses/Orbits: No acute finding. Other: None CT CERVICAL SPINE FINDINGS Alignment: Normal. Skull base and vertebrae: No acute fracture. No primary bone lesion or focal pathologic process. Soft tissues and spinal canal: No prevertebral fluid or swelling. No visible canal hematoma. Disc levels:  Multilevel osteoarthritic changes. Upper chest: Negative. Other: IMPRESSION: 1. No acute intracranial abnormality. 2. Atrophy, chronic microvascular disease. 3. No evidence of acute traumatic injury to cervical spine. 4. Multilevel osteoarthritic changes of the cervical spine. Electronically Signed   By: Ted Mcalpine M.D.   On: 03/17/2019 13:28   Ct  Cervical Spine Wo Contrast  Result Date: 03/17/2019 CLINICAL DATA:  Explain confusion. EXAM: CT HEAD WITHOUT CONTRAST CT CERVICAL SPINE WITHOUT CONTRAST TECHNIQUE: Multidetector CT imaging of the head and cervical spine was performed following the standard protocol without intravenous contrast. Multiplanar CT image reconstructions of the cervical spine were also generated. COMPARISON:  Brain MRI 02/17/2019, cervical spine MRI 02/18/2019 FINDINGS: CT HEAD FINDINGS Brain: No evidence of acute infarction, hemorrhage, hydrocephalus, extra-axial collection or mass lesion/mass effect. Moderate brain parenchymal volume loss and deep white matter microangiopathy. Vascular: Calcific atherosclerotic disease of the intra cavernous carotid arteries. Skull: Normal. Negative for fracture or focal lesion. Sinuses/Orbits: No acute finding. Other: None CT CERVICAL SPINE FINDINGS Alignment: Normal. Skull base and vertebrae: No acute fracture. No primary bone lesion or focal pathologic process. Soft tissues and spinal canal: No prevertebral fluid or swelling. No visible canal hematoma. Disc levels:  Multilevel osteoarthritic changes. Upper chest: Negative. Other: IMPRESSION: 1. No acute intracranial abnormality. 2.  Atrophy, chronic microvascular disease. 3. No evidence of acute traumatic injury to cervical spine. 4. Multilevel osteoarthritic changes of the cervical spine. Electronically Signed   By: Ted Mcalpine M.D.   On: 03/17/2019 13:28   Ct Angio Chest/abd/pel For Dissection W And/or W/wo  Result Date: 03/17/2019 CLINICAL DATA:  83 year old female complaining of back pain. Recent history of fall. EXAM: CT ANGIOGRAPHY CHEST, ABDOMEN AND PELVIS TECHNIQUE: Multidetector CT imaging through the chest, abdomen and pelvis was performed using the standard protocol during bolus administration of intravenous contrast. Multiplanar reconstructed images and MIPs were obtained and reviewed to evaluate the vascular anatomy. CONTRAST:  OMNIPAQUE IOHEXOL 350 MG/ML SOLN COMPARISON:  No prior chest CT.  CT abdomen and pelvis 05/19/2017. FINDINGS: CTA CHEST FINDINGS Cardiovascular: Heart size is mildly enlarged. There is no significant pericardial fluid, thickening or pericardial calcification. There is aortic atherosclerosis, as well as atherosclerosis of the great vessels of the mediastinum and the coronary arteries, including calcified atherosclerotic plaque in the left main, left anterior descending, left circumflex and right coronary arteries. Status post median sternotomy for CABG including LIMA to the LAD. On precontrast images there is no crescentic high attenuation associated with the wall of the thoracic aorta to suggest acute intramural hemorrhage. No aortic aneurysm or dissection. Calcifications of the aortic valve and mitral annulus. Mediastinum/Nodes: No pathologically enlarged mediastinal or hilar lymph nodes. Esophagus is normal in appearance. No axillary lymphadenopathy. Lungs/Pleura: Scattered areas of linear scarring are noted in the lungs bilaterally, most evident near the lung apices and in the lung bases. Areas of mild dependent subsegmental atelectasis also noted in the lower lobes of the lungs  bilaterally. No acute consolidative airspace disease. No pleural effusions. No definite suspicious appearing pulmonary nodules or masses are noted. Musculoskeletal: Median sternotomy wires. There are no aggressive appearing lytic or blastic lesions noted in the visualized portions of the skeleton. Review of the MIP images confirms the above findings. CTA ABDOMEN AND PELVIS FINDINGS VASCULAR Aorta: Normal caliber aorta without aneurysm, dissection, vasculitis or significant stenosis. Celiac: Patent without evidence of aneurysm, dissection, vasculitis or significant stenosis. SMA: Patent without evidence of aneurysm, dissection, vasculitis or significant stenosis. Renals: Both renal arteries are patent without evidence of aneurysm, dissection, vasculitis, fibromuscular dysplasia or significant stenosis. IMA: Patent without evidence of aneurysm, dissection, vasculitis or significant stenosis. Inflow: Patent without evidence of aneurysm, dissection, vasculitis or significant stenosis. Veins: No obvious venous abnormality within the limitations of this arterial phase study. Review of the MIP images confirms the above findings.  NON-VASCULAR Hepatobiliary: No suspicious cystic or solid hepatic lesions. Status post cholecystectomy. No intrahepatic biliary ductal dilatation. Common bile duct measures 7 mm in the porta hepatis, within normal limits for this post cholecystectomy patient. Pancreas: No pancreatic mass. No pancreatic ductal dilatation. No pancreatic or peripancreatic fluid or inflammatory changes. Spleen: Unremarkable. Adrenals/Urinary Tract: Low-attenuation lesions in both kidneys, compatible with simple cysts, largest of which measures 2.1 cm in the upper pole the left kidney. Multifocal cortical thinning in the right kidney, presumably chronic scarring. No definite suspicious renal lesions. No hydroureteronephrosis. Urinary bladder is normal in appearance. Bilateral adrenal glands are normal in appearance.  Stomach/Bowel: Normal appearance of the stomach. No pathologic dilatation of small bowel or colon. The appendix is not confidently identified and may be surgically absent. Regardless, there are no inflammatory changes noted adjacent to the cecum to suggest the presence of an acute appendicitis at this time. Lymphatic: No lymphadenopathy noted in the abdomen or pelvis. Reproductive: Status post hysterectomy. Ovaries are not confidently identified may be surgically absent or trophic. Other: No significant volume of ascites.  No pneumoperitoneum. Musculoskeletal: Median sternotomy wires. No acute displaced fractures or aggressive appearing lytic or blastic lesions are noted in the visualized portions of the skeleton. Review of the MIP images confirms the above findings. IMPRESSION: 1. No acute findings are noted in the chest, abdomen or pelvis to account for the patient's symptoms. Specifically, no evidence of acute aortic syndrome. 2. Mild cardiomegaly. 3. Aortic atherosclerosis, in addition to left main and 3 vessel coronary artery disease. Status post median sternotomy for CABG including LIMA to the LAD. 4. There are calcifications of the aortic valve and mitral annulus. Echocardiographic correlation for evaluation of potential valvular dysfunction may be warranted if clinically indicated. 5. Additional incidental findings, as above. Aortic Atherosclerosis (ICD10-I70.0). Electronically Signed   By: Trudie Reed M.D.   On: 03/17/2019 15:50   Dg Hips Bilat W Or Wo Pelvis 5 Views  Result Date: 03/17/2019 CLINICAL DATA:  Bilateral hip pain EXAM: DG HIP (WITH OR WITHOUT PELVIS) 5+V BILAT COMPARISON:  None. FINDINGS: There is no evidence of hip fracture or dislocation. There is no evidence of arthropathy or other focal bone abnormality. Peripheral vascular atherosclerotic disease. IMPRESSION: No acute osseous injury of the bilateral hips. Electronically Signed   By: Elige Ko   On: 03/17/2019 13:34     Pending Labs Unresulted Labs (From admission, onward)    Start     Ordered   03/17/19 1157  Urine culture  ONCE - STAT,   STAT     03/17/19 1156          Vitals/Pain Today's Vitals   03/17/19 1330 03/17/19 1430 03/17/19 1500 03/17/19 1530  BP: (!) 159/62 127/60 131/64 (!) 112/48  Pulse: 66 68 71 67  Resp: Temp:      TempSrc:      SpO2: 96% 91% 90% 98%  Weight:      Height:      PainSc:        Isolation Precautions No active isolations  Medications Medications  0.9 %  sodium chloride infusion ( Intravenous Stopped 03/17/19 1542)  naloxone (NARCAN) injection 0.4 mg (0.4 mg Intravenous Given 03/17/19 1244)  sodium chloride 0.9 % bolus 500 mL ( Intravenous Stopped 03/17/19 1442)  iohexol (OMNIPAQUE) 350 MG/ML injection 100 mL (100 mLs Intravenous Contrast Given 03/17/19 1527)    Mobility walks with device High fall risk   Focused Assessments  R Recommendations: See Admitting Provider Note  Report given to: Harriett Sine  Additional Notes:

## 2019-03-17 NOTE — ED Notes (Signed)
Placed pt on 2L [place], while pt sleeps O2 sat drops to 89%.

## 2019-03-17 NOTE — Progress Notes (Signed)
CSW followed up Pt's notary needs by contacting EDP secretary and [facility] AC. CSW was advised to contact [place].   [name] Sherryle Lis LCSWA Transitions of Care  Clinical Social Worker  Ph: [phone]

## 2019-03-17 NOTE — Progress Notes (Signed)
CSW contacted Pt's granddaughter to discuss concerns that were brought to the nurse's attention. CSW spoke with Lanora Manis "[name]" via ph: [phone] and engaged in conversation regarding her insight to Pt's home situation. [name] states that she does not feel the Pt is safe to return home upon discharge. Pt's granddaughter goes ito detail and explains that although she lives with her son and husband, they are unable to care for her. Pt's granddaughter adds that Pt's husband is now being followed by hospice and that the son is not likely to provide adequate care for Pt; she states that her son was unable to pick her up off the floor after her most recent fall.   Pt's granddaughter explains that while at home, Pt requires much assistance with walking, toileting and is not always oriented x4. CSW actively listened as [name] explained that she wants to assist her grandmother but the living conditions in the home are barriers, ie: 6 pets and unclean conditions.  CSW explored possible solutions such as short term rehab, LTC, and [facility] as an additional support in the community. CSW inquired about the status of Pt's medicaid application. Pt's granddaughter reported that a medicaid application has been started but obtaining the life insurance policy as been a barrier to completing the medicaid application. Pt's granddaughter goes into detail and explains that they were unable to have a notary notarize health care power of attorney paperwork while at [facility] due to COVID-19 no visitor policy. Lastly, Pt's granddaughter adds that Pt may be reluctant to return to [facility] if an option upon discharge.   CSW will continue to follow up with Pt's discharge needs.   Jeralynn Vaquera Sherryle Lis LCSWA Clinical Social Worker

## 2019-03-17 NOTE — ED Provider Notes (Signed)
[facility] EMERGENCY DEPARTMENT Provider Note   CSN: [id number] Arrival date & time: 03/17/19  1131    History   Chief Complaint Chief Complaint  Patient presents with   Altered Mental Status    HPI [name] [name] is a 83 y.o. female.     The history is provided by the patient, the EMS personnel and a relative. The history is limited by the condition of the patient (AMS).  Altered Mental Status    Pt was seen at 1150.  Per EMS and pt's Home Health staff: Pt lives at home with her son. Call for pt with AMS today. Pt's son states pt was dx with UTI last month and did not take her antibiotics. Pt can manage her own ADL's and medications at baseline, per her son. Pt states she does not know why she is here, unsure if she fell, denies CP/SOB/abd pain/neck or back pain/extremities pain.  Past Medical History:  Diagnosis Date   Arthritis    CAD (coronary artery disease)    Multivessel status post CABG 2001   Chronic obstructive pulmonary disease (HCC)    Chronic pain    DDD (degenerative disc disease), lumbar    Diastolic CHF (HCC)    Essential hypertension    Hematuria    History of CVA (cerebrovascular accident)    2013 - lacunar infarct right thalamus - residual left side of  lip numb  and left eye vision worse (which corrented lens implant from cataract extraction)   History of MI (myocardial infarction)    July 2001   Hyperlipidemia    Hypothyroidism    Insulin dependent type 2 diabetes mellitus (HCC)    Lesion of bladder    Lower urinary tract symptoms (LUTS)    OSA (obstructive sleep apnea)    Peripheral neuropathy    Psoriasis    SUI (stress urinary incontinence, female)    Varicose veins     Patient Active Problem List   Diagnosis Date Noted   Iron deficiency anemia 03/12/2019   Protein-calorie malnutrition, moderate (HCC) 03/12/2019   Hypertension associated with type 2 diabetes mellitus (HCC) 03/09/2019   GERD without  esophagitis 03/09/2019   Dyslipidemia associated with type 2 diabetes mellitus (HCC) 03/09/2019   Uncontrolled type 2 diabetes mellitus with peripheral neuropathy (HCC) 03/09/2019   Major depression, recurrent, chronic (HCC) 03/09/2019   Hypoglycemia 02/22/2019   Generalized weakness 02/12/2019   Fall at home 02/12/2019   Symptomatic Yeast UTI 02/12/2019   Leukocytosis 02/12/2019   AKI (acute kidney injury) (HCC) 02/12/2019   COPD with acute exacerbation (HCC) 12/13/2018   Syncope    Loss of consciousness (HCC) 06/10/2018   Bradycardia, sinus 06/10/2018   Diastolic dysfunction 09/29/2017   CAD (coronary artery disease) 09/28/2017   Acute lower UTI 09/28/2017   Ischemic stroke (HCC) 05/10/2017   Acute ischemic stroke (HCC) 05/10/2017   Dysarthria 03/18/2016   Anemia of chronic disease 11/19/2015   OSA (obstructive sleep apnea) 11/19/2015   Left facial numbness 08/29/2015   Gait instability 06/26/2015   COPD exacerbation (HCC) 11/13/2014   Weakness generalized 12/10/2013   TIA (transient ischemic attack) 06/22/2012   Left-sided headache 06/22/2012   Noncompliance 06/22/2012   Noncompliance with CPAP treatment 06/22/2012   Psoriasis 01/07/2012   Chronic back pain    Chronic obstructive pulmonary disease (HCC)    DM type 2 causing complication (HCC) 02/14/2010   Obesity 02/14/2010   Arteriosclerotic cardiovascular disease (ASCVD) 02/14/2010   Hypothyroidism 02/06/2010   Hyperlipidemia  02/06/2010   Essential hypertension 02/06/2010   Sleep apnea 02/06/2010    Past Surgical History:  Procedure Laterality Date   APPENDECTOMY  age 43   CARDIAC CATHETERIZATION  1991   No sig. cad   CARDIAC CATHETERIZATION  07-01-2000   high grade in-stent restenosis   CARDIAC CATHETERIZATION  04-30-2001   single native vessel cad/ graft patent   CARDIAC CATHETERIZATION  02-13-2006  dr [place] cad with total occluded LAD  and  70% ostial of  small ramus/  grafts patent/  normal ef   CARDIAC CATHETERIZATION  01-20-2000  dr Clifton James   essentially unchanged;  severe single vessel cad with diffuse disease throughout CFX and RCA/ ef 66%;  chf with preserved LVSF   CARDIOVASCULAR STRESS TEST  08-28-2011   DR ROTHBART   NEGATIVE NUCLEAR STUDY/  GLOBAL LVSF/  EF 65%   CARPAL TUNNEL RELEASE Bilateral yrs ago   CATARACT EXTRACTION W/ INTRAOCULAR LENS  IMPLANT, BILATERAL  2013   CHOLECYSTECTOMY  1990   COLONOSCOPY N/A 07/26/2015   Procedure: COLONOSCOPY;  Surgeon: Malissa Hippo, MD;  Location: [facility];  Service: Endoscopy;  Laterality: N/A;  1200   CORONARY ANGIOPLASTY  11-13-1999   balloon angioplasty to mLAD , d2 of LAD   CORONARY ANGIOPLASTY WITH STENT PLACEMENT  01-17-2000   PCI stenting to mLAD & D2 of LAD   CORONARY ARTERY BYPASS GRAFT  07-02-2000   DR Tressie Stalker   SVG to diagonal, LIMA to LAD   CYSTOSCOPY W/ RETROGRADES Bilateral 08/09/2014   Procedure: CYSTOSCOPY, BILATERAL RETROGRADE, HYDRODISTENSION, BLADDER BIOPSY WITH FULGERATION, INSTILL PYRIDIUM AND MARCAINE ;  Surgeon: Anner Crete, MD;  Location: [facility];  Service: Urology;  Laterality: Bilateral;   KNEE ARTHROSCOPY Left 10-18-2003   SYNOVECTOMY   LUMBAR SPINE SURGERY  x2    yrs ago   ORIF HUMERUS FRACTURE Right 04-18-2008   REPAIR FLEXOR TENDON HAND Right 09-20-2008   CARPI RADIALIS TENDON TRANSFER TO EXTENSOR TO INDEX, MIDDLE, RING , LITTLE FINGERS AND DIGITI MININI   ROTATOR CUFF REPAIR Right 1995   TOTAL ABDOMINAL HYSTERECTOMY W/ BILATERAL SALPINGOOPHORECTOMY  age 58   TRANSTHORACIC ECHOCARDIOGRAM  12-12-2013   MILD LVH/  EF 60-65%/  GRADE I DIASTOLIC DYSFUNCTION/  MILD LAE     OB History    Gravida  7   Para  7   Term  7   Preterm      AB      Living  4     SAB      TAB      Ectopic      Multiple      Live Births               Home Medications    Prior to Admission medications     Medication Sig Start Date End Date Taking? Authorizing Provider  acetaminophen (TYLENOL) 325 MG tablet Take 2 tablets (650 mg total) by mouth every 6 (six) hours as needed for mild pain or headache (or Fever >/= 101). 02/26/19   Shon Hale, MD  Amino Acids-Protein Hydrolys (FEEDING SUPPLEMENT, PRO-STAT SUGAR FREE 64,) LIQD Take 30 mLs by mouth daily. 03/09/19   [provider]  amLODipine (NORVASC) 5 MG tablet Take 1 tablet (5 mg total) by mouth daily. 03/11/19   Sharee Holster, NP  aspirin EC 81 MG tablet Take 1 tablet (81 mg total) by mouth daily. 11/03/18   Rhetta Mura, MD  benzonatate (TESSALON) 200 MG capsule Take 1 capsule (200 mg total) by mouth 3 (three) times daily as needed for cough. 03/11/19   Sharee Holster, NP  citalopram (CELEXA) 20 MG tablet Take 1 tablet (20 mg total) by mouth daily. 03/11/19   Sharee Holster, NP  clopidogrel (PLAVIX) 75 MG tablet Take 1 tablet (75 mg total) by mouth daily for 30 days. 03/11/19 04/10/19  Sharee Holster, NP  docusate sodium (COLACE) 100 MG capsule Take 100 mg by mouth daily. 03/09/19   [provider]  ferrous sulfate 325 (65 FE) MG tablet Take 1 tablet (325 mg total) by mouth daily with breakfast. 03/11/19   Sharee Holster, NP  gabapentin (NEURONTIN) 300 MG capsule Take 1 capsule (300 mg total) by mouth 2 (two) times daily. 03/11/19   Sharee Holster, NP  Insulin Isophane & Regular Human (HUMULIN 70/30 KWIKPEN) (70-30) 100 UNIT/ML PEN Inject 25 Units into the skin 2 (two) times daily. 03/11/19   Sharee Holster, NP  ipratropium-albuterol (DUONEB) 0.5-2.5 (3) MG/3ML SOLN Inhale 3 mLs into the lungs 4 (four) times daily as needed (for shortness of breath). 03/11/19   Sharee Holster, NP  levothyroxine (SYNTHROID, LEVOTHROID) 25 MCG tablet Take 1 tablet (25 mcg total) by mouth daily. 03/11/19   Sharee Holster, NP  lisinopril (PRINIVIL,ZESTRIL) 10 MG tablet Take 1 tablet (10 mg total) by mouth daily. 03/11/19   Sharee Holster, NP  LORazepam (ATIVAN) 0.5 MG tablet Take 1 tablet (0.5 mg total) by mouth every 12 (twelve) hours as needed for anxiety or sleep. 03/11/19   Sharee Holster, NP  metFORMIN (GLUCOPHAGE) 500 MG tablet Take 1 tablet (500 mg total) by mouth 2 (two) times daily with a meal. 03/11/19   Chilton Si, Chong Sicilian, NP  nitroGLYCERIN (NITROSTAT) 0.4 MG SL tablet Place 1 tablet (0.4 mg total) under the tongue every 5 (five) minutes as needed for chest pain. 03/11/19   Sharee Holster, NP  NON FORMULARY Diet Type:  NAS, Consistent Carbohydrate 02/26/19   [provider]  oxyCODONE (OXY IR/ROXICODONE) 5 MG immediate release tablet Take 1 tablet (5 mg total) by mouth every 6 (six) hours as needed for severe pain. 03/11/19   Sharee Holster, NP  pantoprazole (PROTONIX) 40 MG tablet Take 1 tablet (40 mg total) by mouth daily at 6 (six) AM. 03/11/19   Chilton Si, Chong Sicilian, NP  simvastatin (ZOCOR) 20 MG tablet Take 1 tablet (20 mg total) by mouth daily. 03/11/19   Sharee Holster, NP  UNABLE TO FIND CPAP from home and previous home settings while sleeping    [provider]    Family History Family History  Problem Relation Age of Onset   Hypertension Mother    Stroke Mother    Coronary artery disease Other        Multiple first and second-degree relatives   Aneurysm Other        Cerebral circulation   Colon cancer Sister    Stroke Sister    Coronary artery disease Sister    Clotting disorder Other        Children diagnosed with hypercoagulable state   Coronary artery disease Brother     Social History Social History   Tobacco Use   Smoking status: Never Smoker   Smokeless tobacco: Never Used  Substance Use Topics   Alcohol use: No   Drug use: No     Allergies   Codeine  Review of Systems Review of Systems  Unable to perform ROS: Mental status change     Physical Exam Updated Vital Signs BP (!) 174/85    Pulse 80    Temp 98.2 F (36.8 C) (Rectal)    Resp  (!) 22    Ht 5' 4.5" (1.638 m)    Wt 89.4 kg    SpO2 99%    BMI 33.33 kg/m     Patient Vitals for the past 24 hrs:  BP Temp Temp src Pulse Resp SpO2 Height Weight  03/17/19 1500 131/64 -- -- 71 15 90 % -- --  03/17/19 1430 127/60 -- -- 68 14 91 % -- --  03/17/19 1330 (!) 159/62 -- -- 66 18 96 % -- --  03/17/19 1300 (!) 174/85 -- -- 80 (!) 22 99 % -- --  03/17/19 1244 -- 98.2 F (36.8 C) Rectal -- -- -- -- --  03/17/19 1230 (!) 108/53 -- -- (!) 57 14 91 % -- --  03/17/19 1141 119/62 -- -- 67 20 92 % -- --  03/17/19 1134 -- -- -- -- -- -- 5' 4.5" (1.638 m) 89.4 kg     Physical Exam 1155: Physical examination:  Nursing notes reviewed; Vital signs and O2 SAT reviewed;  Constitutional: Well developed, Well nourished, In no acute distress; Head:  Normocephalic, atraumatic; Eyes: EOMI, PERRL, No scleral icterus; ENMT: Mouth and pharynx normal, Mucous membranes dry; Neck: Supple, Full range of motion, No lymphadenopathy; Cardiovascular: Regular rate and rhythm, No gallop; Respiratory: Breath sounds clear & equal bilaterally, No wheezes. Normal respiratory effort/excursion; Chest: Nontender, Movement normal; Abdomen: Soft, Nontender, Nondistended, Normal bowel sounds; Genitourinary: No CVA tenderness; Extremities: Peripheral pulses normal, No tenderness, No edema, No calf edema or asymmetry.; Neuro: Somnolent. Opens eyes to name and loud voice, speaks a few words then falls back asleep. No facial droop. Speech clear. Moves all extremities on stretcher spontaneously, falls asleep during commands to move extremities.;; Skin: Color normal, Warm, Dry.   ED Treatments / Results  Labs (all labs ordered are listed, but only abnormal results are displayed)   EKG EKG Interpretation  Date/Time:  Wednesday March 17 2019 11:40:13 EDT Ventricular Rate:  66 PR Interval:    QRS Duration: 105 QT Interval:  401 QTC Calculation: 421 R Axis:   -57 Text Interpretation:  Sinus rhythm Left axis deviation Left  anterior fascicular block Anterior infarct, old Borderline repolarization abnormality When compared with ECG of 02/17/2019 No significant change was found Confirmed by Samuel JesterMcManus, Michael Ventresca [phone]([id number]) on 03/17/2019 12:32:03 PM   Radiology   Procedures Procedures (including critical care time)  Medications Ordered in ED Medications  0.9 %  sodium chloride infusion (has no administration in time range)  sodium chloride 0.9 % bolus 500 mL (has no administration in time range)  naloxone [facility](NARCAN) injection 0.4 mg (0.4 mg Intravenous Given 03/17/19 1244)     Initial Impression / Assessment and Plan / ED Course  I have reviewed the triage vital signs and the nursing notes.  Pertinent labs & imaging results that were available during my care of the patient were reviewed by me and considered in my medical decision making (see chart for details).     MDM Reviewed: previous chart, nursing note and vitals Reviewed previous: labs and ECG Interpretation: labs, ECG, x-ray and CT scan   Results for orders placed or performed during the hospital encounter of 03/17/19  Urine rapid drug screen (hosp performed)  Result Value Ref Range  Opiates POSITIVE (A) NONE DETECTED   Cocaine NONE DETECTED NONE DETECTED   Benzodiazepines POSITIVE (A) NONE DETECTED   Amphetamines NONE DETECTED NONE DETECTED   Tetrahydrocannabinol NONE DETECTED NONE DETECTED   Barbiturates NONE DETECTED NONE DETECTED  Urinalysis, Routine w reflex microscopic  Result Value Ref Range   Color, Urine YELLOW YELLOW   APPearance CLOUDY (A) CLEAR   Specific Gravity, Urine 1.018 1.005 - 1.030   pH 5.0 5.0 - 8.0   Glucose, UA NEGATIVE NEGATIVE mg/dL   Hgb urine dipstick NEGATIVE NEGATIVE   Bilirubin Urine NEGATIVE NEGATIVE   Ketones, ur 5 (A) NEGATIVE mg/dL   Protein, ur 30 (A) NEGATIVE mg/dL   Nitrite NEGATIVE NEGATIVE   Leukocytes,Ua MODERATE (A) NEGATIVE   RBC / HPF 6-10 0 - 5 RBC/hpf   WBC, UA >50 (H) 0 - 5 WBC/hpf   Bacteria,  UA RARE (A) NONE SEEN   Squamous Epithelial / LPF 0-5 0 - 5   WBC Clumps PRESENT    Mucus PRESENT    Hyaline Casts, UA PRESENT   Comprehensive metabolic panel  Result Value Ref Range   Sodium 140 135 - 145 mmol/L   Potassium 4.4 3.5 - 5.1 mmol/L   Chloride 105 98 - 111 mmol/L   CO2 26 22 - 32 mmol/L   Glucose, Bld 129 (H) 70 - 99 mg/dL   BUN 10 8 - 23 mg/dL   Creatinine, Ser 8.11 0.44 - 1.00 mg/dL   Calcium 9.5 8.9 - 91.4 mg/dL   Total Protein 7.1 6.5 - 8.1 g/dL   Albumin 3.8 3.5 - 5.0 g/dL   AST 17 15 - 41 U/L   ALT 15 0 - 44 U/L   Alkaline Phosphatase 52 38 - 126 U/L   Total Bilirubin 0.6 0.3 - 1.2 mg/dL   GFR calc non Af Amer >60 >60 mL/min   GFR calc Af Amer >60 >60 mL/min   Anion gap 9 5 - 15  Troponin I - Once  Result Value Ref Range   Troponin I <0.03 <0.03 ng/mL  Lactic acid, plasma  Result Value Ref Range   Lactic Acid, Venous 1.2 0.5 - 1.9 mmol/L  Lactic acid, plasma  Result Value Ref Range   Lactic Acid, Venous 1.4 0.5 - 1.9 mmol/L  CBC with Differential  Result Value Ref Range   WBC 7.6 4.0 - 10.5 K/uL   RBC 5.04 3.87 - 5.11 MIL/uL   Hemoglobin 12.5 12.0 - 15.0 g/dL   HCT 78.2 95.6 - 21.3 %   MCV 83.5 80.0 - 100.0 fL   MCH 24.8 (L) 26.0 - 34.0 pg   MCHC 29.7 (L) 30.0 - 36.0 g/dL   RDW 08.6 (H) 57.8 - 46.9 %   Platelets 361 150 - 400 K/uL   nRBC 0.0 0.0 - 0.2 %   Neutrophils Relative % 53 %   Neutro Abs 4.0 1.7 - 7.7 K/uL   Lymphocytes Relative 32 %   Lymphs Abs 2.5 0.7 - 4.0 K/uL   Monocytes Relative 11 %   Monocytes Absolute 0.8 0.1 - 1.0 K/uL   Eosinophils Relative 4 %   Eosinophils Absolute 0.3 0.0 - 0.5 K/uL   Basophils Relative 0 %   Basophils Absolute 0.0 0.0 - 0.1 K/uL   Immature Granulocytes 0 %   Abs Immature Granulocytes 0.02 0.00 - 0.07 K/uL  Protime-INR  Result Value Ref Range   Prothrombin Time 13.5 11.4 - 15.2 seconds   INR 1.0 0.8 - 1.2  Blood gas, arterial  Result Value Ref Range   FIO2 32.00    pH, Arterial 7.374 7.350 -  7.450   pCO2 arterial 45.4 32.0 - 48.0 mmHg   pO2, Arterial 77.9 (L) 83.0 - 108.0 mmHg   Bicarbonate 25.2 20.0 - 28.0 mmol/L   Acid-Base Excess 1.2 0.0 - 2.0 mmol/L   O2 Saturation 93.9 %   Patient temperature 37.0    Allens test (pass/fail) PASS PASS   Ct Head Wo Contrast Result Date: 03/17/2019 CLINICAL DATA:  Explain confusion. EXAM: CT HEAD WITHOUT CONTRAST CT CERVICAL SPINE WITHOUT CONTRAST TECHNIQUE: Multidetector CT imaging of the head and cervical spine was performed following the standard protocol without intravenous contrast. Multiplanar CT image reconstructions of the cervical spine were also generated. COMPARISON:  Brain MRI 02/17/2019, cervical spine MRI 02/18/2019 FINDINGS: CT HEAD FINDINGS Brain: No evidence of acute infarction, hemorrhage, hydrocephalus, extra-axial collection or mass lesion/mass effect. Moderate brain parenchymal volume loss and deep white matter microangiopathy. Vascular: Calcific atherosclerotic disease of the intra cavernous carotid arteries. Skull: Normal. Negative for fracture or focal lesion. Sinuses/Orbits: No acute finding. Other: None CT CERVICAL SPINE FINDINGS Alignment: Normal. Skull base and vertebrae: No acute fracture. No primary bone lesion or focal pathologic process. Soft tissues and spinal canal: No prevertebral fluid or swelling. No visible canal hematoma. Disc levels:  Multilevel osteoarthritic changes. Upper chest: Negative. Other: IMPRESSION: 1. No acute intracranial abnormality. 2. Atrophy, chronic microvascular disease. 3. No evidence of acute traumatic injury to cervical spine. 4. Multilevel osteoarthritic changes of the cervical spine. Electronically Signed   By: Ted Mcalpine M.D.   On: 03/17/2019 13:28   Ct Cervical Spine Wo Contrast Result Date: 03/17/2019 CLINICAL DATA:  Explain confusion. EXAM: CT HEAD WITHOUT CONTRAST CT CERVICAL SPINE WITHOUT CONTRAST TECHNIQUE: Multidetector CT imaging of the head and cervical spine was performed  following the standard protocol without intravenous contrast. Multiplanar CT image reconstructions of the cervical spine were also generated. COMPARISON:  Brain MRI 02/17/2019, cervical spine MRI 02/18/2019 FINDINGS: CT HEAD FINDINGS Brain: No evidence of acute infarction, hemorrhage, hydrocephalus, extra-axial collection or mass lesion/mass effect. Moderate brain parenchymal volume loss and deep white matter microangiopathy. Vascular: Calcific atherosclerotic disease of the intra cavernous carotid arteries. Skull: Normal. Negative for fracture or focal lesion. Sinuses/Orbits: No acute finding. Other: None CT CERVICAL SPINE FINDINGS Alignment: Normal. Skull base and vertebrae: No acute fracture. No primary bone lesion or focal pathologic process. Soft tissues and spinal canal: No prevertebral fluid or swelling. No visible canal hematoma. Disc levels:  Multilevel osteoarthritic changes. Upper chest: Negative. Other: IMPRESSION: 1. No acute intracranial abnormality. 2. Atrophy, chronic microvascular disease. 3. No evidence of acute traumatic injury to cervical spine. 4. Multilevel osteoarthritic changes of the cervical spine. Electronically Signed   By: Ted Mcalpine M.D.   On: 03/17/2019 13:28   Dg Hips Bilat W Or Wo Pelvis 5 Views Result Date: 03/17/2019 CLINICAL DATA:  Bilateral hip pain EXAM: DG HIP (WITH OR WITHOUT PELVIS) 5+V BILAT COMPARISON:  None. FINDINGS: There is no evidence of hip fracture or dislocation. There is no evidence of arthropathy or other focal bone abnormality. Peripheral vascular atherosclerotic disease. IMPRESSION: No acute osseous injury of the bilateral hips. Electronically Signed   By: Elige Ko   On: 03/17/2019 13:34     Component 3wk ago  Specimen Description URINE, CATHETERIZED  Performed at [facility], [address], [place], [place] [address]   Special Requests Normal  Performed at [facility], [address], [place], [place] [address]   Culture >=100,000  COLONIES/mL ESCHERICHIA COLIAbnormal    Report Status 02/27/2019 FINAL   Organism ID, Bacteria ESCHERICHIA COLIAbnormal    Resulting Agency [facility]  Susceptibility    Escherichia coli    MIC    AMPICILLIN <=2 SENSITIVE  Sensitive    AMPICILLIN/SULBACTAM <=2 SENSITIVE  Sensitive    CEFAZOLIN <=4 SENSITIVE  Sensitive    CEFTRIAXONE <=1 SENSITIVE  Sensitive    CIPROFLOXACIN <=0.25 SENS... Sensitive    Extended ESBL NEGATIVE  Sensitive    GENTAMICIN <=1 SENSITIVE  Sensitive    IMIPENEM <=0.25 SENS... Sensitive    NITROFURANTOIN <=16 SENSIT... Sensitive    PIP/TAZO <=4 SENSITIVE  Sensitive    TRIMETH/SULFA <=20 SENSIT... Sensitive         Susceptibility Comments   Escherichia coli  >=100,000 COLONIES/mL ESCHERICHIA COLI      Specimen Collected: 02/24/19 15:26  Last Resulted: 02/27/19 07:20         1540:   Pt given IV narcan with improvement in BP and some improvement in mentation, will continue to wake up to voice/touch and talk with staff. Will however, promptly fall back asleep; O2 Sats drop into 80's with sleeping. O2 N/C applied with Sats improved. Somnolence likely related to medication overuse, untreated UTI (per son to EMS). IV rocephin given per sensitivities from 02/27/2019 UC. Another family member (granddaughter) called ED RN and was very concerned with pt's living conditions; SW made aware.  CTA C/A/P to f/u CXR results above is pending; clinical suspicion for aortic pathology is low.  T/C returned from [facility]. Arlean Hopping, case discussed, including:  HPI, pertinent PM/SHx, VS/PE, dx testing, ED course and treatment:  Agreeable to come to ED for evaluation to admit.         Final Clinical Impressions(s) / ED Diagnoses   Final diagnoses:  None    ED Discharge Orders    None       Samuel Jester, DO 03/22/19 1725

## 2019-03-17 NOTE — H&P (Signed)
History and Physical    PLEASE NOTE THAT DRAGON DICTATION SOFTWARE WAS USED IN THE CONSTRUCTION OF THIS NOTE.   [name] [name] ZOX:[id number] DOB: [birth year] DOA: 03/17/2019  PCP: Oval Linsey, MD Patient coming from: Home  I have personally briefly reviewed patient's old medical records in [facility]  Chief Complaint: Confusion  HPI: [name] [name] is a 83 y.o. female with medical history significant for coronary artery disease status post CABG in 2001, chronic diastolic heart failure, ischemic CVA in 2013 with residual left-sided facial numbness, type 2 diabetes mellitus complicated by peripheral neuropathy, obstructive sleep apnea noncompliant with home CPAP, acquired hypothyroidism, chronic pain syndrome, COPD, who is admitted to [facility] on 03/17/2019 with acute encephalopathy after presenting from home to [facility] ED for evaluation of confusion.  In the setting of patient's current mental status, the following history is obtained via my discussions with the emergency department physician as well as review of report from EMS and via chart review.  The patient reportedly lives at home with her son, who reportedly felt that the patient was more somnolent and confused starting this morning, prompting his call to EMS at that time.  Son reportedly conveyed that the patient was in her normal state of health throughout yesterday but became progressively confused and somnolent as this morning progressed.   Of note, the patient was recently hospitalized at [facility] from 02/22/2019 to 02/26/2019 for generalized weakness and hypoglycemia.  During that hospitalization she was also found to have a urinary tract infection, with urine culture from 02/24/2019 demonstrating pansensitive E. Coli.  During that hospitalization, she received a single dose of Rocephin as well as a single dose of fosfomycin.  Additionally, discharge instructions included recommendations for PCP to  oversee administration of another dose of fosfomycin, was to occur on 03/01/2019.  The patient's son reportedly conveyed to EMS today that the patient did not follow-up with her PCP and therefore did not receive this additional dose of recommended fosfomycin.  On 02/26/2019, the patient was discharged to SNF for temporary rehab in setting of generalized weakness.    ED Course: Vital signs in the emergency department were notable for the following: Temperature max 98.2; heart rate 57-80; initial blood pressure 108/53, which improved to 131/69 following interval IV fluids, as further described low; respiratory rate 13-18, and oxygen saturation 92 to 98% on room air.    Labs performed in the ED were notable for the following: CMP notable for sodium 140, bicarbonate 26, creatinine 0.74 relative to 0.78 on 3 16,020, glucose 129.  CBC notable for white blood cell count of 7600 with 55% neutrophils.  Troponin x1-.  Urinalysis showed a cloudy specimen with greater than 50 white blood cells, no squamous epithelial cells, and moderate leukocyte esterace.  Urine specimen was sent for culture prior to initiation of any antibiotics.  Urine drug screen was positive for opioids and benzodiazepine, but was otherwise negative.   1 view chest x-ray, per final radiology report, showed potential widening of the mediastinum relative to prior imaging, potentially representing a poor inspiratory effort, but otherwise showed no evidence of acute cardiopulmonary process, including no evidence of infiltrate, edema, or effusion.  The finding of potential development of widening of the mediastinum prompted ordering of CTA of the chest, abdomen, pelvis, which showed no evidence of acute process, including no evidence of acute aortic syndrome.  Additionally, CT of the head and cervical spine demonstrated no evidence of acute process.  While  still in the ED, the patient received Narcan 0.4 mg IV x1, following which she reportedly  demonstrated transient improvement in her somnolence.  Additionally, she received a 500 cc bolus of normal saline.  Subsequently, the patient was admitted to the MedSurg floor for further evaluation and management of presenting acute encephalopathy.    Review of Systems: As per HPI otherwise 10 point review of systems negative.   Past Medical History:  Diagnosis Date   Arthritis    CAD (coronary artery disease)    Multivessel status post CABG 2001   Chronic obstructive pulmonary disease (HCC)    Chronic pain    DDD (degenerative disc disease), lumbar    Diastolic CHF (HCC)    Essential hypertension    Hematuria    History of CVA (cerebrovascular accident)    2013 - lacunar infarct right thalamus - residual left side of  lip numb  and left eye vision worse (which corrented lens implant from cataract extraction)   History of MI (myocardial infarction)    July 2001   Hyperlipidemia    Hypothyroidism    Insulin dependent type 2 diabetes mellitus (HCC)    Lesion of bladder    Lower urinary tract symptoms (LUTS)    OSA (obstructive sleep apnea)    Peripheral neuropathy    Psoriasis    SUI (stress urinary incontinence, female)    Varicose veins     Past Surgical History:  Procedure Laterality Date   APPENDECTOMY  age 52   CARDIAC CATHETERIZATION  1991   No sig. cad   CARDIAC CATHETERIZATION  07-01-2000   high grade in-stent restenosis   CARDIAC CATHETERIZATION  04-30-2001   single native vessel cad/ graft patent   CARDIAC CATHETERIZATION  02-13-2006  dr [place] cad with total occluded LAD  and  70% ostial of small ramus/  grafts patent/  normal ef   CARDIAC CATHETERIZATION  01-20-2000  dr Clifton James   essentially unchanged;  severe single vessel cad with diffuse disease throughout CFX and RCA/ ef 66%;  chf with preserved LVSF   CARDIOVASCULAR STRESS TEST  08-28-2011   DR ROTHBART   NEGATIVE NUCLEAR STUDY/  GLOBAL LVSF/  EF 65%   CARPAL TUNNEL  RELEASE Bilateral yrs ago   CATARACT EXTRACTION W/ INTRAOCULAR LENS  IMPLANT, BILATERAL  2013   CHOLECYSTECTOMY  1990   COLONOSCOPY N/A 07/26/2015   Procedure: COLONOSCOPY;  Surgeon: Malissa Hippo, MD;  [facility];  Service: Endoscopy;  Laterality: N/A;  1200   CORONARY ANGIOPLASTY  11-13-1999   balloon angioplasty to mLAD , d2 of LAD   CORONARY ANGIOPLASTY WITH STENT PLACEMENT  01-17-2000   PCI stenting to mLAD & D2 of LAD   CORONARY ARTERY BYPASS GRAFT  07-02-2000   DR Tressie Stalker   SVG to diagonal, LIMA to LAD   CYSTOSCOPY W/ RETROGRADES Bilateral 08/09/2014   Procedure: CYSTOSCOPY, BILATERAL RETROGRADE, HYDRODISTENSION, BLADDER BIOPSY WITH FULGERATION, INSTILL PYRIDIUM AND MARCAINE ;  Surgeon: Anner Crete, MD;  Location: [facility];  Service: Urology;  Laterality: Bilateral;   KNEE ARTHROSCOPY Left 10-18-2003   SYNOVECTOMY   LUMBAR SPINE SURGERY  x2    yrs ago   ORIF HUMERUS FRACTURE Right 04-18-2008   REPAIR FLEXOR TENDON HAND Right 09-20-2008   CARPI RADIALIS TENDON TRANSFER TO EXTENSOR TO INDEX, MIDDLE, RING , LITTLE FINGERS AND DIGITI MININI   ROTATOR CUFF REPAIR Right 1995   TOTAL ABDOMINAL HYSTERECTOMY W/ BILATERAL SALPINGOOPHORECTOMY  age 83   TRANSTHORACIC ECHOCARDIOGRAM  12-12-2013   MILD LVH/  EF 60-65%/  GRADE I DIASTOLIC DYSFUNCTION/  MILD LAE    Social History:  reports that she has never smoked. She has never used smokeless tobacco. She reports that she does not drink alcohol or use drugs.   Allergies  Allergen Reactions   Codeine Itching    Family History  Problem Relation Age of Onset   Hypertension Mother    Stroke Mother    Coronary artery disease Other        Multiple first and second-degree relatives   Aneurysm Other        Cerebral circulation   Colon cancer Sister    Stroke Sister    Coronary artery disease Sister    Clotting disorder Other        Children diagnosed with hypercoagulable state    Coronary artery disease Brother       Prior to Admission medications   Medication Sig Start Date End Date Taking? Authorizing Provider  acetaminophen (TYLENOL) 325 MG tablet Take 2 tablets (650 mg total) by mouth every 6 (six) hours as needed for mild pain or headache (or Fever >/= 101). 02/26/19   Shon HaleEmokpae, Courage, MD  Amino Acids-Protein Hydrolys (FEEDING SUPPLEMENT, PRO-STAT SUGAR FREE 64,) LIQD Take 30 mLs by mouth daily. 03/09/19   [provider]  amLODipine (NORVASC) 5 MG tablet Take 1 tablet (5 mg total) by mouth daily. 03/11/19   Sharee HolsterGreen, Deborah S, NP  aspirin EC 81 MG tablet Take 1 tablet (81 mg total) by mouth daily. 11/03/18   Rhetta MuraSamtani, Jai-Gurmukh, MD  benzonatate (TESSALON) 200 MG capsule Take 1 capsule (200 mg total) by mouth 3 (three) times daily as needed for cough. 03/11/19   Sharee HolsterGreen, Deborah S, NP  citalopram (CELEXA) 20 MG tablet Take 1 tablet (20 mg total) by mouth daily. 03/11/19   Sharee HolsterGreen, Deborah S, NP  clopidogrel (PLAVIX) 75 MG tablet Take 1 tablet (75 mg total) by mouth daily for 30 days. 03/11/19 04/10/19  Sharee HolsterGreen, Deborah S, NP  docusate sodium (COLACE) 100 MG capsule Take 100 mg by mouth daily. 03/09/19   [provider]  ferrous sulfate 325 (65 FE) MG tablet Take 1 tablet (325 mg total) by mouth daily with breakfast. 03/11/19   Sharee HolsterGreen, Deborah S, NP  gabapentin (NEURONTIN) 300 MG capsule Take 1 capsule (300 mg total) by mouth 2 (two) times daily. 03/11/19   Sharee HolsterGreen, Deborah S, NP  Insulin Isophane & Regular Human (HUMULIN 70/30 KWIKPEN) (70-30) 100 UNIT/ML PEN Inject 25 Units into the skin 2 (two) times daily. 03/11/19   Sharee HolsterGreen, Deborah S, NP  ipratropium-albuterol (DUONEB) 0.5-2.5 (3) MG/3ML SOLN Inhale 3 mLs into the lungs 4 (four) times daily as needed (for shortness of breath). 03/11/19   Sharee HolsterGreen, Deborah S, NP  levothyroxine (SYNTHROID, LEVOTHROID) 25 MCG tablet Take 1 tablet (25 mcg total) by mouth daily. 03/11/19   Sharee HolsterGreen, Deborah S, NP  lisinopril (PRINIVIL,ZESTRIL)  10 MG tablet Take 1 tablet (10 mg total) by mouth daily. 03/11/19   Sharee HolsterGreen, Deborah S, NP  LORazepam (ATIVAN) 0.5 MG tablet Take 1 tablet (0.5 mg total) by mouth every 12 (twelve) hours as needed for anxiety or sleep. 03/11/19   Sharee HolsterGreen, Deborah S, NP  metFORMIN (GLUCOPHAGE) 500 MG tablet Take 1 tablet (500 mg total) by mouth 2 (two) times daily with a meal. 03/11/19   Chilton SiGreen, Chong Sicilianeborah S, NP  nitroGLYCERIN (NITROSTAT) 0.4 MG SL tablet Place 1 tablet (0.4  mg total) under the tongue every 5 (five) minutes as needed for chest pain. 03/11/19   Sharee Holster, NP  NON FORMULARY Diet Type:  NAS, Consistent Carbohydrate 02/26/19   [provider]  oxyCODONE (OXY IR/ROXICODONE) 5 MG immediate release tablet Take 1 tablet (5 mg total) by mouth every 6 (six) hours as needed for severe pain. 03/11/19   Sharee Holster, NP  pantoprazole (PROTONIX) 40 MG tablet Take 1 tablet (40 mg total) by mouth daily at 6 (six) AM. 03/11/19   Chilton Si, Chong Sicilian, NP  simvastatin (ZOCOR) 20 MG tablet Take 1 tablet (20 mg total) by mouth daily. 03/11/19   Sharee Holster, NP  UNABLE TO FIND CPAP from home and previous home settings while sleeping    [provider]     Objective    Physical Exam: Vitals:   03/17/19 1330 03/17/19 1430 03/17/19 1500 03/17/19 1530  BP: (!) 159/62 127/60 131/64 (!) 112/48  Pulse: 66 68 71 67  Resp: Temp:      TempSrc:      SpO2: 96% 91% 90% 98%  Weight:      Height:        General: appears to be stated age; somnolent, briefly opening eyes to verbal stimuli before falling back asleep.  Will offer 1-2 word responses prompted by questions posed to her before falling back to sleep; unable to follow instructions, but noted to be spontaneously moving all 4 extremities. Skin: warm, dry, no rash Head:  AT/[place] Mouth:  Oral mucosa membranes appear dry, normal dentition Neck: supple; trachea midline Heart:  RRR; did not appreciate any M/R/G Lungs: CTAB, did not appreciate any  wheezes, rales, or rhonchi Abdomen: + BS; soft, ND, NT Extremities: no peripheral edema, no muscle wasting Neuro: In the setting of patient's current mental status and associated inability to follow instructions at this time, unable to perform full neurologic assessment at the present time, including that of evaluation of strength, sensation, and cranial nerves.  However, the patient was noted to spontaneously move all 4 extremities.   Labs on Admission: I have personally reviewed following labs and imaging studies  CBC: Recent Labs  Lab 03/17/19 1203  WBC 7.6  NEUTROABS 4.0  HGB 12.5  HCT 42.1  MCV 83.5  PLT 361   Basic Metabolic Panel: Recent Labs  Lab 03/17/19 1203  NA 140  K 4.4  CL 105  CO2 26  GLUCOSE 129*  BUN 10  CREATININE 0.74  CALCIUM 9.5   GFR: Estimated Creatinine Clearance: 61.4 mL/min (by C-G formula based on SCr of 0.74 mg/dL). Liver Function Tests: Recent Labs  Lab 03/17/19 1203  AST 17  ALT 15  ALKPHOS 52  BILITOT 0.6  PROT 7.1  ALBUMIN 3.8   No results for input(s): LIPASE, AMYLASE in the last 168 hours. No results for input(s): AMMONIA in the last 168 hours. Coagulation Profile: Recent Labs  Lab 03/17/19 1203  INR 1.0   Cardiac Enzymes: Recent Labs  Lab 03/17/19 1203  TROPONINI <0.03   BNP (last 3 results) No results for input(s): PROBNP in the last 8760 hours. HbA1C: No results for input(s): HGBA1C in the last 72 hours. CBG: No results for input(s): GLUCAP in the last 168 hours. Lipid Profile: No results for input(s): CHOL, HDL, LDLCALC, TRIG, CHOLHDL, LDLDIRECT in the last 72 hours. Thyroid Function Tests: No results for input(s): TSH, T4TOTAL, FREET4, T3FREE, THYROIDAB in the last 72 hours. Anemia Panel:  No results for input(s): VITAMINB12, FOLATE, FERRITIN, TIBC, IRON, RETICCTPCT in the last 72 hours. Urine analysis:    Component Value Date/Time   COLORURINE YELLOW 03/17/2019 1247   APPEARANCEUR CLOUDY (A) 03/17/2019  1247   LABSPEC 1.018 03/17/2019 1247   PHURINE 5.0 03/17/2019 1247   GLUCOSEU NEGATIVE 03/17/2019 1247   HGBUR NEGATIVE 03/17/2019 1247   BILIRUBINUR NEGATIVE 03/17/2019 1247   KETONESUR 5 (A) 03/17/2019 1247   PROTEINUR 30 (A) 03/17/2019 1247   UROBILINOGEN 0.2 08/29/2015 1842   NITRITE NEGATIVE 03/17/2019 1247   LEUKOCYTESUR MODERATE (A) 03/17/2019 1247    Radiological Exams on Admission: Dg Chest 1 View  Result Date: 03/17/2019 CLINICAL DATA:  Altered mental status. EXAM: CHEST  1 VIEW COMPARISON:  02/12/2019 FINDINGS: Postsurgical changes from CABG. The cardiac silhouette is enlarged. There is an apparent widening of the mediastinum, when compared to patient's prior radiographs. There is no evidence of focal airspace consolidation, or pneumothorax. Possible right pleural effusion. Osseous structures are without acute abnormality. Soft tissues are grossly normal. IMPRESSION: 1. Apparent widening of the mediastinum, when compared to patient's prior radiographs. This may be due to poor inspiratory effort, however vascular anomaly can not be excluded. Please correlate to patient's symptomatology. If the patient displays signs of acute aortic syndrome, then CT angiogram of the chest should be performed. Otherwise PA and lateral radiograph the chest may be considered. 2. Possible right pleural effusion. These results were called by telephone at the time of interpretation on 03/17/2019 at 1:43 pm to Dr. Samuel Jester , who verbally acknowledged these results. Electronically Signed   By: Ted Mcalpine M.D.   On: 03/17/2019 13:45   Ct Head Wo Contrast  Result Date: 03/17/2019 CLINICAL DATA:  Explain confusion. EXAM: CT HEAD WITHOUT CONTRAST CT CERVICAL SPINE WITHOUT CONTRAST TECHNIQUE: Multidetector CT imaging of the head and cervical spine was performed following the standard protocol without intravenous contrast. Multiplanar CT image reconstructions of the cervical spine were also generated.  COMPARISON:  Brain MRI 02/17/2019, cervical spine MRI 02/18/2019 FINDINGS: CT HEAD FINDINGS Brain: No evidence of acute infarction, hemorrhage, hydrocephalus, extra-axial collection or mass lesion/mass effect. Moderate brain parenchymal volume loss and deep white matter microangiopathy. Vascular: Calcific atherosclerotic disease of the intra cavernous carotid arteries. Skull: Normal. Negative for fracture or focal lesion. Sinuses/Orbits: No acute finding. Other: None CT CERVICAL SPINE FINDINGS Alignment: Normal. Skull base and vertebrae: No acute fracture. No primary bone lesion or focal pathologic process. Soft tissues and spinal canal: No prevertebral fluid or swelling. No visible canal hematoma. Disc levels:  Multilevel osteoarthritic changes. Upper chest: Negative. Other: IMPRESSION: 1. No acute intracranial abnormality. 2. Atrophy, chronic microvascular disease. 3. No evidence of acute traumatic injury to cervical spine. 4. Multilevel osteoarthritic changes of the cervical spine. Electronically Signed   By: Ted Mcalpine M.D.   On: 03/17/2019 13:28   Ct Cervical Spine Wo Contrast  Result Date: 03/17/2019 CLINICAL DATA:  Explain confusion. EXAM: CT HEAD WITHOUT CONTRAST CT CERVICAL SPINE WITHOUT CONTRAST TECHNIQUE: Multidetector CT imaging of the head and cervical spine was performed following the standard protocol without intravenous contrast. Multiplanar CT image reconstructions of the cervical spine were also generated. COMPARISON:  Brain MRI 02/17/2019, cervical spine MRI 02/18/2019 FINDINGS: CT HEAD FINDINGS Brain: No evidence of acute infarction, hemorrhage, hydrocephalus, extra-axial collection or mass lesion/mass effect. Moderate brain parenchymal volume loss and deep white matter microangiopathy. Vascular: Calcific atherosclerotic disease of the intra cavernous carotid arteries. Skull: Normal. Negative for fracture or focal lesion.  Sinuses/Orbits: No acute finding. Other: None CT CERVICAL  SPINE FINDINGS Alignment: Normal. Skull base and vertebrae: No acute fracture. No primary bone lesion or focal pathologic process. Soft tissues and spinal canal: No prevertebral fluid or swelling. No visible canal hematoma. Disc levels:  Multilevel osteoarthritic changes. Upper chest: Negative. Other: IMPRESSION: 1. No acute intracranial abnormality. 2. Atrophy, chronic microvascular disease. 3. No evidence of acute traumatic injury to cervical spine. 4. Multilevel osteoarthritic changes of the cervical spine. Electronically Signed   By: Ted Mcalpine M.D.   On: 03/17/2019 13:28   Dg Hips Bilat W Or Wo Pelvis 5 Views  Result Date: 03/17/2019 CLINICAL DATA:  Bilateral hip pain EXAM: DG HIP (WITH OR WITHOUT PELVIS) 5+V BILAT COMPARISON:  None. FINDINGS: There is no evidence of hip fracture or dislocation. There is no evidence of arthropathy or other focal bone abnormality. Peripheral vascular atherosclerotic disease. IMPRESSION: No acute osseous injury of the bilateral hips. Electronically Signed   By: Elige Ko   On: 03/17/2019 13:34     Assessment/Plan   [name] Gills is a 83 y.o. female with medical history significant for coronary artery disease status post CABG in 2001, chronic diastolic heart failure, ischemic CVA in 2013 with residual left-sided facial numbness, type 2 diabetes mellitus complicated by peripheral neuropathy, obstructive sleep apnea noncompliant with home CPAP, acquired hypothyroidism, chronic pain syndrome, COPD, who is admitted to [facility] on 03/17/2019 with acute encephalopathy after presenting from home to [facility] ED for evaluation of confusion.   Principal Problem:   Acute metabolic encephalopathy Active Problems:   Hypothyroidism   Essential hypertension   Acute lower UTI   Uncontrolled type 2 diabetes mellitus with peripheral neuropathy (HCC)   Chronic diastolic CHF (congestive heart failure) (HCC)    #) Acute encephalopathy: diagnosis on  basis of report of confusion and somnolence starting this morning.  Suspect multifactorial etiologies, with an element of acute metabolic encephalopathy stemming from suspected underlying urinary tract infection, as further described below, superimposed on component of acute toxic encephalopathy given the presence multiple central acting home medications, including oxycodone, Ativan, and gabapentin.  No evidence of additional underlying infectious process at this time.  While unable to perform full neurologic assessment given the patient's current mental status and associated inability to follow instructions, acute ischemic CVA is felt to be less likely at this time given that the patient was observed to be moving all 4 extremities.  Consider the possibility of hypercapnic encephalopathy given history of COPD and noncompliance with home CPAP, however, ABG demonstrates no evidence of significant CO2 retention.  We will also check TSH given underlying history of acquired hypothyroidism.  Of note, presenting blood sugar, per CMP, was noted to be 129.  Plan: Work-up and management of suspected underlying urinary tract infection, as further described below.  Will hold home central acting medications for now, including home oxycodone, Ativan, gabapentin, in order to allow for a washout.  During which trending of patient's mental status will be evaluated.  Nursing bedside swallow evaluation has been ordered, with instructions to keep the patient n.p.o. until she has passed this swallow evaluation.  Check TSH.  Repeat BMP in the morning.  Repeat CBC in the morning.  PRN Narcan.    #) Urinary tract infection: While unable to assess for specific urinary symptoms in the context of patient's current mental status, presenting urinalysis is suggestive of an underlying urinary tract infection given the presence of greater than 50 white blood cells,  moderate leukocyte esterace, and no squamous epithelial cells.  There is  potential contribution from a reported missed dose of fosfomycin which was recommended following diagnosis of urinary tract infection during recent hospitalization, as further described above.  Urine culture collected on 02/24/2019 demonstrated E. Coli, that was found to be pansensitive, including to the cephalosporins. of note, presentation is not associated with any SIRS criteria, and therefore the patient is not considered septic at this time.  Plan: Collect blood cultures x2 now, prior to initiation of antibiotics.  Start Rocephin following collection of blood cultures.  Will monitor for results of urine culture collected in the emergency department today.  Repeat CBC with differential in the morning.    #) Chronic diastolic heart failure: Most recent echocardiogram, which is performed in September 2019 showed moderate LVH, EF 60 to 65%, no focal wall motion abnormalities, and grade 1 diastolic dysfunction.  Does not appear that the patient is on any diuretics as an outpatient.  No evidence of acutely decompensated heart failure at this time.  Plan: Monitor strict I's and O's, and monitor for evidence suggestive of development volume overload, particular in the setting of presence of underlying infection as well as in the context of administration of IV fluids in the ED today.     #) Type 2 diabetes mellitus: Complicated by peripheral polyneuropathy.  On 70/30 insulin 25 units BID as outpatient as well as metformin.  Presenting blood sugar per CMP noted to be 129.  Plan: We will hold home 70/30 insulin for now.  I have ordered Accu-Cheks q. before meals and at bedtime with associated sliding scale insulin.  Will hold home metformin during this hospitalization.  We will also hold home gabapentin for now given presentation involving acute encephalopathy.     #) Acquired hypothyroidism: On Synthroid as an outpatient.  Plan: We will check TSH given presentation of acute encephalopathy.  We will  continue home Synthroid for now.     #) Essential hypertension: On Norvasc and lisinopril as an outpatient.  Presenting blood pressure noted to be 108/53, which increased to 131/69 following IV fluids administered in the ED.   Plan: We will hold home antihypertensive medications for now in the context of concomitant underlying infectious process.     #) GERD: On Protonix as an outpatient.  Plan: We will hold home PPI as this class can be associated with a degree of encephalopathy.    #) Depression/anxiety: Outpatient regimen includes Celexa as well as PRN Ativan.  Given presenting acute encephalopathy, will hold home Ativan for now.  Plan: Continue home Celexa while holding home Ativan, as further described above.    DVT prophylaxis: scd's Code Status: presumed full Family Communication: (none) Disposition Plan:  Per Rounding Team Consults called: (none)  Admission status: inpatient; med-surg.    PLEASE NOTE THAT DRAGON DICTATION SOFTWARE WAS USED IN THE CONSTRUCTION OF THIS NOTE.   Angie Fava DO [facility] Pager [phone] From 3PM- 11PM.   Otherwise, please contact night-coverage  [email or web address] Password [facility]  03/17/2019, 3:49 PM

## 2019-03-17 NOTE — ED Triage Notes (Signed)
Per EMS pt's home health associate pt is altered. Lives at home with her son. Was seen last month for a UTI and never took any antibx. Unknown if she took too much or none of her medications. Normally pt is able to manage her own medications and can perform ADL's.

## 2019-03-17 NOTE — Progress Notes (Signed)
Has been lethargic and oriented x 1. States she is in the hospital in [place] and lives with son and husband and commented that she doesn't know why she is so sleepy.   Awakens to name and fed self some supper and swallowed pills whole after passing swallow screen.  Applied foam dressing to sacrum for protection.

## 2019-03-17 NOTE — ED Notes (Signed)
Attempted to contact pt's son about dispo, no answer.

## 2019-03-18 DIAGNOSIS — E1165 Type 2 diabetes mellitus with hyperglycemia: Secondary | ICD-10-CM

## 2019-03-18 DIAGNOSIS — E1142 Type 2 diabetes mellitus with diabetic polyneuropathy: Secondary | ICD-10-CM

## 2019-03-18 DIAGNOSIS — R400 Somnolence: Secondary | ICD-10-CM

## 2019-03-18 LAB — BASIC METABOLIC PANEL
Anion gap: 9 (ref 5–15)
BUN: 9 mg/dL (ref 8–23)
CALCIUM: 9 mg/dL (ref 8.9–10.3)
CO2: 24 mmol/L (ref 22–32)
CREATININE: 0.56 mg/dL (ref 0.44–1.00)
Chloride: 106 mmol/L (ref 98–111)
GFR calc Af Amer: >60 mL/min (ref >60)
GFR calc non Af Amer: >60 mL/min (ref >60)
Glucose, Bld: 101 mg/dL — ABNORMAL HIGH (ref 70–99)
Potassium: 3.6 mmol/L (ref 3.5–5.1)
Sodium: 139 mmol/L (ref 135–145)

## 2019-03-18 LAB — CBC
HCT: 36.2 % (ref 36.0–46.0)
Hemoglobin: 10.6 g/dL — ABNORMAL LOW (ref 12.0–15.0)
MCH: 24.5 pg — ABNORMAL LOW (ref 26.0–34.0)
MCHC: 29.3 g/dL — ABNORMAL LOW (ref 30.0–36.0)
MCV: 83.6 fL (ref 80.0–100.0)
PLATELETS: 306 10*3/uL (ref 150–400)
RBC: 4.33 MIL/uL (ref 3.87–5.11)
RDW: 17.8 % — ABNORMAL HIGH (ref 11.5–15.5)
WBC: 6.6 10*3/uL (ref 4.0–10.5)
nRBC: 0 % (ref 0.0–0.2)

## 2019-03-18 LAB — URINE CULTURE: Culture: NO GROWTH

## 2019-03-18 LAB — GLUCOSE, CAPILLARY
Glucose-Capillary: 107 mg/dL — ABNORMAL HIGH (ref 70–99)
Glucose-Capillary: 133 mg/dL — ABNORMAL HIGH (ref 70–99)
Glucose-Capillary: 132 mg/dL — ABNORMAL HIGH (ref 70–99)
Glucose-Capillary: 122 mg/dL — ABNORMAL HIGH (ref 70–99)

## 2019-03-18 LAB — TSH: TSH: 0.558 u[IU]/mL (ref 0.350–4.500)

## 2019-03-18 LAB — MAGNESIUM: MAGNESIUM: 1.7 mg/dL (ref 1.7–2.4)

## 2019-03-18 MED ORDER — POLYETHYLENE GLYCOL 3350 17 G PO PACK
17.0000 g | PACK | Freq: Every day | ORAL | Status: DC
  Administered 2019-03-18 – 2019-03-20 (×3): 17 g via ORAL
  Filled 2019-03-18 (×3): qty 1

## 2019-03-18 MED ORDER — GRX ANALGESIC BALM EX OINT
TOPICAL_OINTMENT | CUTANEOUS | Status: DC | PRN
  Administered 2019-03-18 – 2019-03-19 (×2): via TOPICAL
  Filled 2019-03-18: qty 85
  Filled 2019-03-18: qty 28

## 2019-03-18 MED ORDER — DIPHENHYDRAMINE HCL 25 MG PO CAPS
25.0000 mg | ORAL_CAPSULE | Freq: Once | ORAL | Status: AC
  Administered 2019-03-18: 25 mg via ORAL
  Filled 2019-03-18: qty 1

## 2019-03-18 NOTE — TOC Initial Note (Signed)
Transition of Care [facility]) - Initial/Assessment Note    Patient Details  Name: [name] [name] MRN: [id number] Date of Birth: Jun 21, 1936  [facility] CM/SW Contact:    Annice Needy, LCSW Phone Number: 03/18/2019, 5:11 PM  Clinical Narrative:                 Patient's granddaughter, [name] [name], stated that they have a pending Medicaid application for LTC. She stated that the barrier to obtaining the LTC is they the family is having a difficult time obtaining a two life insurance polices to give to DSS. She stated that the money is being drafted from her account monthly but they did not know which company. LCSW discussed that the POA may be able to go the the bank to identify which company is drafting the money from the account. [name] [name] stated that her son is POA and that the would go to the bank to try and figure this out.   Gillermina Hu, x [phone], at [facility] DSS/Medicaid, indicated that patient's family has been working hard to get the documentation for the LTC medicaid. She confirmed that the barrier was the two life insurance policy documents that she needed to review to identify if they impacted her application.  Patient stated she too had advised the family to go to the bank to identify the company who was drafting the funds.   Patient was discharged from short term rehab at [facility] on 3/21, with [facility] services in place through [facility]. Patient states that she plans on going home despite her family wanting her to go to LTC. Patient stated that she felt that she could manage in the home despite her readmissions and evident difficulty in the home.   Expected Discharge Plan: Long Term Nursing Home Barriers to Discharge: Inadequate or no insurance(Patient's LTC Medicaid application is pending. They are still missing documentation related to a life insurance policy. )   Patient Goals and CMS Choice Patient states their goals for this hospitalization and ongoing recovery are:: Patient  states that she plans on returning home at discharge.       Expected Discharge Plan and Services Expected Discharge Plan: Long Term Nursing Home In-house Referral: Chaplain Discharge Planning Services: CM Consult   Living arrangements for the past 2 months: Skilled Nursing Facility, Single Family Home                       HH Agency: [facility]  Prior Living Arrangements/Services Living arrangements for the past 2 months: Skilled Nursing Facility, Single Family Home Lives with:: Adult Children, Spouse Patient language and need for interpreter reviewed:: No Do you feel safe going back to the place where you live?: Yes   (P) n/a  Need for Family Participation in Patient Care: Yes (Comment) Care giver support system in place?: Yes (comment) Current home services: DME, Home PT, Homehealth aide Criminal Activity/Legal Involvement Pertinent to Current Situation/Hospitalization: No - Comment as needed  Activities of Daily Living Home Assistive Devices/Equipment: Dan Humphreys (specify type) ADL Screening (condition at time of admission) Patient's cognitive ability adequate to safely complete daily activities?: No Is the patient deaf or have difficulty hearing?: No Does the patient have difficulty seeing, even when wearing glasses/contacts?: No Does the patient have difficulty concentrating, remembering, or making decisions?: Yes Patient able to express need for assistance with ADLs?: Yes Does the patient have difficulty dressing or bathing?: Yes Independently performs ADLs?: Yes (appropriate for developmental age) Communication:  Independent Dressing (OT): Needs assistance Is this a change from baseline?: Pre-admission baseline Grooming: Needs assistance Is this a change from baseline?: Pre-admission baseline Feeding: Independent Bathing: Needs assistance Is this a change from baseline?: Pre-admission baseline Toileting: Needs assistance Is this a change from  baseline?: Pre-admission baseline In/Out Bed: Needs assistance Is this a change from baseline?: Pre-admission baseline Walks in Home: Needs assistance Is this a change from baseline?: Pre-admission baseline Does the patient have difficulty walking or climbing stairs?: Yes Weakness of Legs: Both Weakness of Arms/Hands: None  Permission Sought/Granted Permission sought to share information with : PCP, Family Supports, Other (comment)       Permission granted to share info w AGENCY: Gillermina Hu, DSS Medicaid worker  Permission granted to share info w Relationship: granddaughter, [name] [name]     Emotional Assessment Appearance:: Appears stated age Attitude/Demeanor/Rapport: Self-Confident Affect (typically observed): Calm Orientation: : Oriented to Self, Oriented to Place, Oriented to  Time, Oriented to Situation Alcohol / Substance Use: Not Applicable Psych Involvement: No (comment)  Admission diagnosis:  Somnolence [R40.0] Opiate use [F11.90] Urinary tract infection without hematuria, site unspecified [N39.0] History of benzodiazepine use [Z87.898] Patient Active Problem List   Diagnosis Date Noted  . Acute metabolic encephalopathy 03/17/2019  . Chronic diastolic CHF (congestive heart failure) (HCC) 03/17/2019  . Iron deficiency anemia 03/12/2019  . Protein-calorie malnutrition, moderate (HCC) 03/12/2019  . Hypertension associated with type 2 diabetes mellitus (HCC) 03/09/2019  . GERD without esophagitis 03/09/2019  . Dyslipidemia associated with type 2 diabetes mellitus (HCC) 03/09/2019  . Uncontrolled type 2 diabetes mellitus with peripheral neuropathy (HCC) 03/09/2019  . Major depression, recurrent, chronic (HCC) 03/09/2019  . Hypoglycemia 02/22/2019  . Generalized weakness 02/12/2019  . Fall at home 02/12/2019  . Symptomatic Yeast UTI 02/12/2019  . Leukocytosis 02/12/2019  . AKI (acute kidney injury) (HCC) 02/12/2019  . COPD with acute exacerbation (HCC) 12/13/2018  .  Syncope   . Loss of consciousness (HCC) 06/10/2018  . Bradycardia, sinus 06/10/2018  . Diastolic dysfunction 09/29/2017  . CAD (coronary artery disease) 09/28/2017  . Acute lower UTI 09/28/2017  . Ischemic stroke (HCC) 05/10/2017  . Acute ischemic stroke (HCC) 05/10/2017  . Dysarthria 03/18/2016  . Anemia of chronic disease 11/19/2015  . OSA (obstructive sleep apnea) 11/19/2015  . Left facial numbness 08/29/2015  . Gait instability 06/26/2015  . COPD exacerbation (HCC) 11/13/2014  . Weakness generalized 12/10/2013  . TIA (transient ischemic attack) 06/22/2012  . Left-sided headache 06/22/2012  . Noncompliance 06/22/2012  . Noncompliance with CPAP treatment 06/22/2012  . Psoriasis 01/07/2012  . Chronic back pain   . Chronic obstructive pulmonary disease (HCC)   . DM type 2 causing complication (HCC) 02/14/2010  . Obesity 02/14/2010  . Arteriosclerotic cardiovascular disease (ASCVD) 02/14/2010  . Hypothyroidism 02/06/2010  . Hyperlipidemia 02/06/2010  . Essential hypertension 02/06/2010  . Sleep apnea 02/06/2010   PCP:  Oval Linsey, MD Pharmacy:   [facility], [place] - [address] [place] [address] Phone: [phone] Fax: [phone]  [id number] [place], [place] - [address] AT [facility] [address] [place] [address] Phone: [phone] Fax: [phone]  [facility], [place], [place] - [address] [place] [address] Phone: [phone] Fax: [phone]     Social Determinants of Health (SDOH) Interventions    Readmission Risk Interventions Readmission Risk Prevention Plan 03/18/2019  Transportation Screening Complete  HRI or Home Care Consult Complete  SW  Recovery Care/Counseling Consult Complete  Palliative Care Screening Not Applicable  Some recent data might be hidden

## 2019-03-18 NOTE — Plan of Care (Signed)
  Problem: Acute Rehab PT Goals(only PT should resolve) Goal: Pt Will Go Sit To Supine/Side Flowsheets (Taken 03/18/2019 1137) Pt will go Sit to Supine/Side: with supervision; with min guard assist Goal: Patient Will Transfer Sit To/From Stand Flowsheets (Taken 03/18/2019 1137) Patient will transfer sit to/from stand: with supervision; with min guard assist Goal: Pt Will Transfer Bed To Chair/Chair To Bed Flowsheets (Taken 03/18/2019 1137) Pt will Transfer Bed to Chair/Chair to Bed: min guard assist Goal: Pt Will Ambulate Flowsheets (Taken 03/18/2019 1137) Pt will Ambulate: 25 feet; with minimal assist; with rolling walker    Jac Canavan PT, DPT

## 2019-03-18 NOTE — Evaluation (Signed)
Physical Therapy Evaluation Patient Details Name: [name] [name] MRN: [id number] DOB: 09-26-1936 Today's Date: 03/18/2019   History of Present Illness  [name] [name] is a 83 y.o. female with medical history significant for coronary artery disease status post CABG in 2001, chronic diastolic heart failure, ischemic CVA in 2013 with residual left-sided facial numbness, type 2 diabetes mellitus complicated by peripheral neuropathy, obstructive sleep apnea noncompliant with home CPAP, acquired hypothyroidism, chronic pain syndrome, COPD, who is admitted to [facility] on 03/17/2019 with acute encephalopathy after presenting from home to [facility] ED for evaluation of confusion.  Clinical Impression  Pt received in bed and was agreeable to PT evaluation. Pt from home with husband and son and prior to all of her recent hospital admissions, pt was ambulatory with [facility] and I for ADLs. Pt has required progressively more assistance due to weakness and was d/c home from SNF on 03/12/19 and has had multiple falls in the last few weeks (per chart review); pt admitted with above diagnosis on 03/17/19. Currently, pt required min A for all mobility this date due to weakness and c/o L hip pain. Pt limited to SPT with RW bed > BSC and few short sidesteps to chair from [facility] with RW due to weakness and L hip pain. PT recommending SNF upon d/c due to deficits in her strength, balance, gait, and overall functional mobility and functional strength in order to promote return to PLOF and reduce her risk for falls. Pt agreeable to SNF but is adamant that she doesn't want to return to [facility]. Will continue to follow acutely.      Follow Up Recommendations SNF    Equipment Recommendations  None recommended by PT    Recommendations for Other Services       Precautions / Restrictions Precautions Precautions: Fall Precaution Comments: pt c/o L hip pain and per chart review, multiple falls in last few  weeks Restrictions Weight Bearing Restrictions: No      Mobility  Bed Mobility Overal bed mobility: Needs Assistance Bed Mobility: Supine to Sit     Supine to sit: Min assist;HOB elevated     General bed mobility comments: slow, labored movement; min A for LLE negotiation and upper body transition  Transfers Overall transfer level: Needs assistance Equipment used: Rolling walker (2 wheeled) Transfers: Sit to/from [employer] Sit to Stand: Min assist Stand pivot transfers: Min assist       General transfer comment: slow labored movement, diffiuclty secondary to c/o LLE pain, cues for proper sequencing and for hand placement for safety  Ambulation/Gait Ambulation/Gait assistance: Min assist Gait Distance (Feet): 5 Feet Assistive device: Rolling walker (2 wheeled) Gait Pattern/deviations: Decreased stride length;Step-to pattern;Wide base of support;Decreased stance time - left;Antalgic Gait velocity: decreased   General Gait Details: limited to SPT bed > BSC and sidestepping BSC > chair due to weakness and LLE pain.  Stairs            Wheelchair Mobility    Modified Rankin (Stroke Patients Only)       Balance Overall balance assessment: Needs assistance Sitting-balance support: Feet supported;No upper extremity supported Sitting balance-Leahy Scale: Good     Standing balance support: Bilateral upper extremity supported;During functional activity Standing balance-Leahy Scale: Fair Standing balance comment: w/ RW                             Pertinent Vitals/Pain Pain Assessment: 0-10 Pain  Score: 7  Pain Location: L leg Pain Descriptors / Indicators: Aching Pain Intervention(s): Limited activity within patient's tolerance;Monitored during session;Repositioned    Home Living Family/patient expects to be discharged to:: Private residence Living Arrangements: Spouse/significant other;Children Available Help at Discharge:  Family Type of Home: House Home Access: Stairs to enter Entrance Stairs-Rails: Right;Left;Can reach both Secretary/administrator of Steps: 2 Home Layout: One level Home Equipment: Shower seat;Cane - single point;Walker - 2 wheels;Bedside commode Additional Comments: pt reports that her husband is still at SNF for rehab, her son is at home and she reports that he could help physically assist her if she needed it and daughter on disability    Prior Function Level of Independence: Independent with assistive device(s);Needs assistance   Gait / Transfers Assistance Needed: per chart review, pt has needed increasingly more assistance with mobility due to weakness; chart review also states recent falls due to weakness     Comments: community ambulator with [facility], drove, prior to admission in 2/20; pt recently d/c home from SNF following rehab in SNF, reports she doesn't feel any stronger since her rehab stay     Hand Dominance   Dominant Hand: Right    Extremity/Trunk Assessment   Upper Extremity Assessment Upper Extremity Assessment: Overall WFL for tasks assessed    Lower Extremity Assessment Lower Extremity Assessment: Generalized weakness    Cervical / Trunk Assessment Cervical / Trunk Assessment: Normal  Communication   Communication: No difficulties  Cognition Arousal/Alertness: Awake/alert Behavior During Therapy: WFL for tasks assessed/performed Overall Cognitive Status: Within Functional Limits for tasks assessed                                 General Comments: pt not sure why she came to hospital and thought the year was 1990, but otherwise, pt had appropriate answers and responses throughout entire session      General Comments      Exercises     Assessment/Plan    PT Assessment Patient needs continued PT services  PT Problem List Decreased strength;Decreased activity tolerance;Decreased balance;Decreased mobility;Pain;Decreased safety  awareness       PT Treatment Interventions Gait training;Stair training;Functional mobility training;Therapeutic activities;Patient/family education;Therapeutic exercise;DME instruction;Balance training;Manual techniques    PT Goals (Current goals can be found in the Care Plan section)  Acute Rehab PT Goals Patient Stated Goal: home PT Goal Formulation: With patient Time For Goal Achievement: 03/25/19 Potential to Achieve Goals: Good    Frequency Min 3X/week   Barriers to discharge Decreased caregiver support      Co-evaluation               AM-PAC PT "6 Clicks" Mobility  Outcome Measure Help needed turning from your back to your side while in a flat bed without using bedrails?: A Little Help needed moving from lying on your back to sitting on the side of a flat bed without using bedrails?: A Little Help needed moving to and from a bed to a chair (including a wheelchair)?: A Little Help needed standing up from a chair using your arms (e.g., wheelchair or bedside chair)?: A Little Help needed to walk in hospital room?: A Lot Help needed climbing 3-5 steps with a railing? : Total 6 Click Score: 15    End of Session Equipment Utilized During Treatment: Gait belt Activity Tolerance: Patient tolerated treatment well;Patient limited by pain Patient left: in chair;with call bell/phone within reach;with chair alarm  set;with nursing/sitter in room Nurse Communication: Mobility status(NT in room during PT session and notified RN of pt's status at EOS) PT Visit Diagnosis: Unsteadiness on feet (R26.81);Muscle weakness (generalized) (M62.81);Other abnormalities of gait and mobility (R26.89);Pain;Repeated falls (R29.6);History of falling (Z91.81);Difficulty in walking, not elsewhere classified (R26.2) Pain - Right/Left: Left Pain - part of body: Hip    Time: 5208-0223 PT Time Calculation (min) (ACUTE ONLY): 30 min   Charges:   PT Evaluation $PT Eval Low Complexity: 1 Low PT  Treatments $Therapeutic Activity: 8-22 mins          Jac Canavan PT, DPT

## 2019-03-18 NOTE — Progress Notes (Signed)
PROGRESS NOTE    [name] [name]  MBT:[id number] DOB: 1936/10/19 DOA: 03/17/2019 PCP: Oval Linsey, MD    Brief Narrative:  83 year old female with multiple medical problems including diabetes, prior CVA, chronic diastolic heart failure, coronary artery disease, COPD, who was recently admitted to [facility] hospital discharge to skilled nursing facility.  She was recently discharged from the skilled nursing facility on return home.  She is brought back to the emergency room with increasing somnolence and worsening mental status.  She was found to have a possible urinary tract infection.  She is currently on antibiotics.  Overall mental status is improving.  Seen by physical therapy who recommended skilled nursing facility placement.   Assessment & Plan:   Principal Problem:   Acute metabolic encephalopathy Active Problems:   Hypothyroidism   Essential hypertension   Acute lower UTI   Uncontrolled type 2 diabetes mellitus with peripheral neuropathy (HCC)   Chronic diastolic CHF (congestive heart failure) (HCC)   1. Acute encephalopathy.  Felt to be multifactorial, possibly related to metabolic from urinary tract infection, as well as a toxic component from medications.  Overall mental status has improved since admission.  ABG did not show any significant CO2 retention. 2. Urinary tract infection.  Currently on Rocephin.  Urinalysis indicated possible infection.  Follow-up urine culture. 3. Chronic diastolic heart failure.  Appears compensated currently.  Last echo was 08/2018 that showed ejection fraction of 60 to 65%.  Continue to monitor volume status. 4. Diabetes.  She has a history of peripheral polyneuropathy.  Currently on sliding scale insulin.  Metformin is currently on hold.  70/30 insulin which has been held.  Blood sugars are currently stable.  Continue to monitor. 5. Left hip pain.  Patient underwent x-ray imaging of her pelvis as well as CT imaging of her pelvis that did not  show any fractures.  She is having difficulty standing related to pain.  May have developed some muscle injury/strain.  Seen by physical therapy recommended skilled nursing facility placement   DVT prophylaxis: SCDs Code Status: Full code Family Communication: No family present Disposition Plan: Skilled nursing facility placement on discharge   Consultants:     Procedures:     Antimicrobials:   Ceftriaxone 3/25 >   Subjective: Complains of pain in her left hip and has difficulty standing.  Objective: Vitals:   03/17/19 2151 03/18/19 0500 03/18/19 0629 03/18/19 1401  BP: (!) 123/49  129/60 (!) 142/69  Pulse: (!) 58  (!) 59 75  Resp: 20  20 19   Temp: 98.4 F (36.9 C)  99.1 F (37.3 C) 98.6 F (37 C)  TempSrc: Oral  Oral Oral  SpO2: 98%  97% 98%  Weight:  93.5 kg    Height:        Intake/Output Summary (Last 24 hours) at 03/18/2019 1827 Last data filed at 03/18/2019 1300 Gross per 24 hour  Intake 1328.88 ml  Output 250 ml  Net 1078.88 ml   Filed Weights   03/17/19 1134 03/17/19 1716 03/18/19 0500  Weight: 89.4 kg 90.1 kg 93.5 kg    Examination:  General exam: Appears calm and comfortable  Respiratory system: Clear to auscultation. Respiratory effort normal. Cardiovascular system: S1 & S2 heard, RRR. No JVD, murmurs, rubs, gallops or clicks. No pedal edema. Gastrointestinal system: Abdomen is nondistended, soft and nontender. No organomegaly or masses felt. Normal bowel sounds heard. Central nervous system: Alert and oriented. No focal neurological deficits. Extremities: Tenderness over left hip Skin: No rashes, lesions  or ulcers Psychiatry: Judgement and insight appear normal. Mood & affect appropriate.     Data Reviewed: I have personally reviewed following labs and imaging studies  CBC: Recent Labs  Lab 03/17/19 1203 03/18/19 0430  WBC 7.6 6.6  NEUTROABS 4.0  --   HGB 12.5 10.6*  HCT 42.1 36.2  MCV 83.5 83.6  PLT 361 306   Basic Metabolic  Panel: Recent Labs  Lab 03/17/19 1203 03/18/19 0430  NA 140 139  K 4.4 3.6  CL 105 106  CO2 26 24  GLUCOSE 129* 101*  BUN 10 9  CREATININE 0.74 0.56  CALCIUM 9.5 9.0  MG  --  1.7   GFR: Estimated Creatinine Clearance: 64.6 mL/min (by C-G formula based on SCr of 0.56 mg/dL). Liver Function Tests: Recent Labs  Lab 03/17/19 1203  AST 17  ALT 15  ALKPHOS 52  BILITOT 0.6  PROT 7.1  ALBUMIN 3.8   No results for input(s): LIPASE, AMYLASE in the last 168 hours. No results for input(s): AMMONIA in the last 168 hours. Coagulation Profile: Recent Labs  Lab 03/17/19 1203  INR 1.0   Cardiac Enzymes: Recent Labs  Lab 03/17/19 1203  TROPONINI <0.03   BNP (last 3 results) No results for input(s): PROBNP in the last 8760 hours. HbA1C: No results for input(s): HGBA1C in the last 72 hours. CBG: Recent Labs  Lab 03/17/19 1715 03/17/19 2116 03/18/19 0734 03/18/19 1106 03/18/19 1641  GLUCAP 102* 122* 107* 133* 132*   Lipid Profile: No results for input(s): CHOL, HDL, LDLCALC, TRIG, CHOLHDL, LDLDIRECT in the last 72 hours. Thyroid Function Tests: Recent Labs    03/18/19 0430  TSH 0.558   Anemia Panel: No results for input(s): VITAMINB12, FOLATE, FERRITIN, TIBC, IRON, RETICCTPCT in the last 72 hours. Sepsis Labs: Recent Labs  Lab 03/17/19 1203 03/17/19 1422  LATICACIDVEN 1.2 1.4    Recent Results (from the past 240 hour(s))  Culture, blood (routine x 2)     Status: None (Preliminary result)   Collection Time: 03/17/19 11:44 AM  Result Value Ref Range Status   Specimen Description RIGHT ANTECUBITAL  Final   Special Requests   Final    BOTTLES DRAWN AEROBIC AND ANAEROBIC Blood Culture adequate volume   Culture   Final    NO GROWTH < 24 HOURS Performed at [facility], [address], [place], [place] [address]    Report Status PENDING  Incomplete  Culture, blood (routine x 2)     Status: None (Preliminary result)   Collection Time: 03/17/19 11:44 AM    Result Value Ref Range Status   Specimen Description LEFT ANTECUBITAL  Final   Special Requests   Final    BOTTLES DRAWN AEROBIC AND ANAEROBIC Blood Culture adequate volume   Culture   Final    NO GROWTH < 24 HOURS Performed at [facility], [address] [place], [place] [address]    Report Status PENDING  Incomplete  Urine culture     Status: None   Collection Time: 03/17/19 12:47 PM  Result Value Ref Range Status   Specimen Description   Final    URINE, CATHETERIZED Performed at [facility], [address], [place], [place] [address]    Special Requests   Final    NONE Performed at [facility], [address], [place], [place] [address]    Culture   Final    NO GROWTH Performed at [facility], [address], [place], [place] [address]  Report Status 03/18/2019 FINAL  Final         Radiology Studies: Dg Chest 1 View  Result Date: 03/17/2019 CLINICAL DATA:  Altered mental status. EXAM: CHEST  1 VIEW COMPARISON:  02/12/2019 FINDINGS: Postsurgical changes from CABG. The cardiac silhouette is enlarged. There is an apparent widening of the mediastinum, when compared to patient's prior radiographs. There is no evidence of focal airspace consolidation, or pneumothorax. Possible right pleural effusion. Osseous structures are without acute abnormality. Soft tissues are grossly normal. IMPRESSION: 1. Apparent widening of the mediastinum, when compared to patient's prior radiographs. This may be due to poor inspiratory effort, however vascular anomaly can not be excluded. Please correlate to patient's symptomatology. If the patient displays signs of acute aortic syndrome, then CT angiogram of the chest should be performed. Otherwise PA and lateral radiograph the chest may be considered. 2. Possible right pleural effusion. These results were called by telephone at the time of interpretation on 03/17/2019 at 1:43 pm to Dr. Samuel Jester , who verbally acknowledged these results.  Electronically Signed   By: Ted Mcalpine M.D.   On: 03/17/2019 13:45   Ct Head Wo Contrast  Result Date: 03/17/2019 CLINICAL DATA:  Explain confusion. EXAM: CT HEAD WITHOUT CONTRAST CT CERVICAL SPINE WITHOUT CONTRAST TECHNIQUE: Multidetector CT imaging of the head and cervical spine was performed following the standard protocol without intravenous contrast. Multiplanar CT image reconstructions of the cervical spine were also generated. COMPARISON:  Brain MRI 02/17/2019, cervical spine MRI 02/18/2019 FINDINGS: CT HEAD FINDINGS Brain: No evidence of acute infarction, hemorrhage, hydrocephalus, extra-axial collection or mass lesion/mass effect. Moderate brain parenchymal volume loss and deep white matter microangiopathy. Vascular: Calcific atherosclerotic disease of the intra cavernous carotid arteries. Skull: Normal. Negative for fracture or focal lesion. Sinuses/Orbits: No acute finding. Other: None CT CERVICAL SPINE FINDINGS Alignment: Normal. Skull base and vertebrae: No acute fracture. No primary bone lesion or focal pathologic process. Soft tissues and spinal canal: No prevertebral fluid or swelling. No visible canal hematoma. Disc levels:  Multilevel osteoarthritic changes. Upper chest: Negative. Other: IMPRESSION: 1. No acute intracranial abnormality. 2. Atrophy, chronic microvascular disease. 3. No evidence of acute traumatic injury to cervical spine. 4. Multilevel osteoarthritic changes of the cervical spine. Electronically Signed   By: Ted Mcalpine M.D.   On: 03/17/2019 13:28   Ct Cervical Spine Wo Contrast  Result Date: 03/17/2019 CLINICAL DATA:  Explain confusion. EXAM: CT HEAD WITHOUT CONTRAST CT CERVICAL SPINE WITHOUT CONTRAST TECHNIQUE: Multidetector CT imaging of the head and cervical spine was performed following the standard protocol without intravenous contrast. Multiplanar CT image reconstructions of the cervical spine were also generated. COMPARISON:  Brain MRI 02/17/2019,  cervical spine MRI 02/18/2019 FINDINGS: CT HEAD FINDINGS Brain: No evidence of acute infarction, hemorrhage, hydrocephalus, extra-axial collection or mass lesion/mass effect. Moderate brain parenchymal volume loss and deep white matter microangiopathy. Vascular: Calcific atherosclerotic disease of the intra cavernous carotid arteries. Skull: Normal. Negative for fracture or focal lesion. Sinuses/Orbits: No acute finding. Other: None CT CERVICAL SPINE FINDINGS Alignment: Normal. Skull base and vertebrae: No acute fracture. No primary bone lesion or focal pathologic process. Soft tissues and spinal canal: No prevertebral fluid or swelling. No visible canal hematoma. Disc levels:  Multilevel osteoarthritic changes. Upper chest: Negative. Other: IMPRESSION: 1. No acute intracranial abnormality. 2. Atrophy, chronic microvascular disease. 3. No evidence of acute traumatic injury to cervical spine. 4. Multilevel osteoarthritic changes of the cervical spine. Electronically Signed   By: Ulanda Edison.D.  On: 03/17/2019 13:28   Ct Angio Chest/abd/pel For Dissection W And/or W/wo  Result Date: 03/17/2019 CLINICAL DATA:  83 year old female complaining of back pain. Recent history of fall. EXAM: CT ANGIOGRAPHY CHEST, ABDOMEN AND PELVIS TECHNIQUE: Multidetector CT imaging through the chest, abdomen and pelvis was performed using the standard protocol during bolus administration of intravenous contrast. Multiplanar reconstructed images and MIPs were obtained and reviewed to evaluate the vascular anatomy. CONTRAST:  OMNIPAQUE IOHEXOL 350 MG/ML SOLN COMPARISON:  No prior chest CT.  CT abdomen and pelvis 05/19/2017. FINDINGS: CTA CHEST FINDINGS Cardiovascular: Heart size is mildly enlarged. There is no significant pericardial fluid, thickening or pericardial calcification. There is aortic atherosclerosis, as well as atherosclerosis of the great vessels of the mediastinum and the coronary arteries, including  calcified atherosclerotic plaque in the left main, left anterior descending, left circumflex and right coronary arteries. Status post median sternotomy for CABG including LIMA to the LAD. On precontrast images there is no crescentic high attenuation associated with the wall of the thoracic aorta to suggest acute intramural hemorrhage. No aortic aneurysm or dissection. Calcifications of the aortic valve and mitral annulus. Mediastinum/Nodes: No pathologically enlarged mediastinal or hilar lymph nodes. Esophagus is normal in appearance. No axillary lymphadenopathy. Lungs/Pleura: Scattered areas of linear scarring are noted in the lungs bilaterally, most evident near the lung apices and in the lung bases. Areas of mild dependent subsegmental atelectasis also noted in the lower lobes of the lungs bilaterally. No acute consolidative airspace disease. No pleural effusions. No definite suspicious appearing pulmonary nodules or masses are noted. Musculoskeletal: Median sternotomy wires. There are no aggressive appearing lytic or blastic lesions noted in the visualized portions of the skeleton. Review of the MIP images confirms the above findings. CTA ABDOMEN AND PELVIS FINDINGS VASCULAR Aorta: Normal caliber aorta without aneurysm, dissection, vasculitis or significant stenosis. Celiac: Patent without evidence of aneurysm, dissection, vasculitis or significant stenosis. SMA: Patent without evidence of aneurysm, dissection, vasculitis or significant stenosis. Renals: Both renal arteries are patent without evidence of aneurysm, dissection, vasculitis, fibromuscular dysplasia or significant stenosis. IMA: Patent without evidence of aneurysm, dissection, vasculitis or significant stenosis. Inflow: Patent without evidence of aneurysm, dissection, vasculitis or significant stenosis. Veins: No obvious venous abnormality within the limitations of this arterial phase study. Review of the MIP images confirms the above findings.  NON-VASCULAR Hepatobiliary: No suspicious cystic or solid hepatic lesions. Status post cholecystectomy. No intrahepatic biliary ductal dilatation. Common bile duct measures 7 mm in the porta hepatis, within normal limits for this post cholecystectomy patient. Pancreas: No pancreatic mass. No pancreatic ductal dilatation. No pancreatic or peripancreatic fluid or inflammatory changes. Spleen: Unremarkable. Adrenals/Urinary Tract: Low-attenuation lesions in both kidneys, compatible with simple cysts, largest of which measures 2.1 cm in the upper pole the left kidney. Multifocal cortical thinning in the right kidney, presumably chronic scarring. No definite suspicious renal lesions. No hydroureteronephrosis. Urinary bladder is normal in appearance. Bilateral adrenal glands are normal in appearance. Stomach/Bowel: Normal appearance of the stomach. No pathologic dilatation of small bowel or colon. The appendix is not confidently identified and may be surgically absent. Regardless, there are no inflammatory changes noted adjacent to the cecum to suggest the presence of an acute appendicitis at this time. Lymphatic: No lymphadenopathy noted in the abdomen or pelvis. Reproductive: Status post hysterectomy. Ovaries are not confidently identified may be surgically absent or trophic. Other: No significant volume of ascites.  No pneumoperitoneum. Musculoskeletal: Median sternotomy wires. No acute displaced fractures or aggressive appearing lytic  or blastic lesions are noted in the visualized portions of the skeleton. Review of the MIP images confirms the above findings. IMPRESSION: 1. No acute findings are noted in the chest, abdomen or pelvis to account for the patient's symptoms. Specifically, no evidence of acute aortic syndrome. 2. Mild cardiomegaly. 3. Aortic atherosclerosis, in addition to left main and 3 vessel coronary artery disease. Status post median sternotomy for CABG including LIMA to the LAD. 4. There are  calcifications of the aortic valve and mitral annulus. Echocardiographic correlation for evaluation of potential valvular dysfunction may be warranted if clinically indicated. 5. Additional incidental findings, as above. Aortic Atherosclerosis (ICD10-I70.0). Electronically Signed   By: Trudie Reed M.D.   On: 03/17/2019 15:50   Dg Hips Bilat W Or Wo Pelvis 5 Views  Result Date: 03/17/2019 CLINICAL DATA:  Bilateral hip pain EXAM: DG HIP (WITH OR WITHOUT PELVIS) 5+V BILAT COMPARISON:  None. FINDINGS: There is no evidence of hip fracture or dislocation. There is no evidence of arthropathy or other focal bone abnormality. Peripheral vascular atherosclerotic disease. IMPRESSION: No acute osseous injury of the bilateral hips. Electronically Signed   By: Elige Ko   On: 03/17/2019 13:34        Scheduled Meds:  aspirin EC  81 mg Oral Daily   citalopram  20 mg Oral Daily   clopidogrel  75 mg Oral Daily   docusate sodium  100 mg Oral Daily   ferrous sulfate  325 mg Oral Q breakfast   insulin aspart  0-15 Units Subcutaneous TID WC   levothyroxine  25 mcg Oral Q0600   polyethylene glycol  17 g Oral Daily   simvastatin  20 mg Oral q1800   Continuous Infusions:  cefTRIAXone (ROCEPHIN)  IV 1 g (03/18/19 1720)     LOS: 1 day    Time spent:    Erick Blinks, MD [facility]   If 7PM-7AM, please contact night-coverage [email or web address]  03/18/2019, 6:27 PM

## 2019-03-18 NOTE — Plan of Care (Signed)

## 2019-03-19 ENCOUNTER — Inpatient Hospital Stay (HOSPITAL_COMMUNITY): Payer: Medicare HMO

## 2019-03-19 LAB — GLUCOSE, CAPILLARY
GLUCOSE-CAPILLARY: 157 mg/dL — AB (ref 70–99)
GLUCOSE-CAPILLARY: 153 mg/dL — AB (ref 70–99)
Glucose-Capillary: 147 mg/dL — ABNORMAL HIGH (ref 70–99)
Glucose-Capillary: 117 mg/dL — ABNORMAL HIGH (ref 70–99)

## 2019-03-19 MED ORDER — OXYCODONE HCL 5 MG PO TABS
5.0000 mg | ORAL_TABLET | Freq: Four times a day (QID) | ORAL | Status: DC | PRN
  Administered 2019-03-19 – 2019-03-20 (×4): 5 mg via ORAL
  Filled 2019-03-19 (×4): qty 1

## 2019-03-19 MED ORDER — METHOCARBAMOL 500 MG PO TABS
500.0000 mg | ORAL_TABLET | Freq: Four times a day (QID) | ORAL | Status: DC
  Administered 2019-03-19 – 2019-03-20 (×3): 500 mg via ORAL
  Filled 2019-03-19 (×3): qty 1

## 2019-03-19 NOTE — Progress Notes (Signed)
Physical Therapy Treatment Patient Details Name: [name] [name] MRN: [id number] DOB: January 01, 1936 Today's Date: 03/19/2019    History of Present Illness [name] [name] is a 83 y.o. female with medical history significant for coronary artery disease status post CABG in 2001, chronic diastolic heart failure, ischemic CVA in 2013 with residual left-sided facial numbness, type 2 diabetes mellitus complicated by peripheral neuropathy, obstructive sleep apnea noncompliant with home CPAP, acquired hypothyroidism, chronic pain syndrome, COPD, who is admitted to [facility] on 03/17/2019 with acute encephalopathy after presenting from home to [facility] ED for evaluation of confusion.    PT Comments    Pt sitting in [facility] upon therapist entrance, had been sitting up in chair for a couple hours and limited by Lt LE pain.  RN aware of pain and pre-medicated to assist with pain.  Min A with transfer training and gait with RW.  Pt limited by short distance due to weakness and Lt LE pain.  Cueing for hand placement to assist with safety and control with transfer.  EOS pt requested to return to bed, call bell within reach and MHP placed on thigh for pain control upon request.  RN aware of status and NT requested to replace pure wic.  Pain scale remained at 8/10 through session.     Follow Up Recommendations  SNF     Equipment Recommendations  None recommended by PT    Recommendations for Other Services       Precautions / Restrictions Precautions Precautions: Fall Precaution Comments: pt c/o L knee pain and per chart review, multiple falls in last few weeks Restrictions Weight Bearing Restrictions: No    Mobility  Bed Mobility               General bed mobility comments: pt sitting on BSC upon entrance  Transfers Overall transfer level: Needs assistance Equipment used: Rolling walker (2 wheeled) Transfers: Sit to/from Stand Sit to Stand: Min assist         General transfer  comment: slow labored movement, limited by Lt LE pain, cueing for hand placement for assistance and safety  Ambulation/Gait Ambulation/Gait assistance: Min assist Gait Distance (Feet): 7 Feet Assistive device: Rolling walker (2 wheeled) Gait Pattern/deviations: Decreased stride length;Step-to pattern;Wide base of support;Decreased stance time - left;Antalgic Gait velocity: decreased   General Gait Details: limited by Lt LE pain and weakness.  Slow labored movements with RW from [facility] to bed   Stairs             Wheelchair Mobility    Modified Rankin (Stroke Patients Only)       Balance                                            Cognition Arousal/Alertness: Awake/alert Behavior During Therapy: WFL for tasks assessed/performed Overall Cognitive Status: Within Functional Limits for tasks assessed                                 General Comments: able to verbalize name, DOB and [name]      Exercises Total Joint Exercises Ankle Circles/Pumps: Right;10 reps;Seated(Lt limited by pain) Long Arc Quad: AROM;Right;10 reps;Seated(Lt limited by pain)    General Comments        Pertinent Vitals/Pain Pain Assessment: 0-10 Pain Score: 8  Pain Location: L thigh and knee pain Pain Descriptors / Indicators: Aching;Dull Pain Intervention(s): Limited activity within patient's tolerance;Monitored during session;Premedicated before session;Repositioned;Heat applied(MHP applied to thigh for pain control)    Home Living                      Prior Function            PT Goals (current goals can now be found in the care plan section)      Frequency    Min 3X/week      PT Plan Current plan remains appropriate    Co-evaluation              AM-PAC PT "6 Clicks" Mobility   Outcome Measure  Help needed turning from your back to your side while in a flat bed without using bedrails?: A Little Help needed moving from  lying on your back to sitting on the side of a flat bed without using bedrails?: A Little Help needed moving to and from a bed to a chair (including a wheelchair)?: A Little Help needed standing up from a chair using your arms (e.g., wheelchair or bedside chair)?: A Little Help needed to walk in hospital room?: A Lot Help needed climbing 3-5 steps with a railing? : Total 6 Click Score: 15    End of Session Equipment Utilized During Treatment: Gait belt Activity Tolerance: Patient tolerated treatment well;Patient limited by pain Patient left: in bed;with call bell/phone within reach;with bed alarm set Nurse Communication: Mobility status(RN aware of status; requested NT to replace pure wic) PT Visit Diagnosis: Unsteadiness on feet (R26.81);Muscle weakness (generalized) (M62.81);Other abnormalities of gait and mobility (R26.89);Pain;Repeated falls (R29.6);History of falling (Z91.81);Difficulty in walking, not elsewhere classified (R26.2) Pain - Right/Left: Left Pain - part of body: Knee;Leg     Time: 2025-4270 PT Time Calculation (min) (ACUTE ONLY): 21 min  Charges:  $Therapeutic Exercise: 8-22 mins $Therapeutic Activity: 8-22 mins                     [address], LPTA; CBIS [phone]  Juel Burrow 03/19/2019, 10:08 AM

## 2019-03-19 NOTE — Care Management Important Message (Signed)
Important Message  Patient Details  Name: [name] [name] MRN: [id number] Date of Birth: Aug 07, 1936   Medicare Important Message Given:  Yes    Corey Harold 03/19/2019, 4:42 PM

## 2019-03-19 NOTE — TOC Progression Note (Addendum)
Transition of Care [facility]) - Progression Note    Patient Details  Name: [name] [name] MRN: [id number] Date of Birth: 03-18-1936  [facility] CM/SW Contact  Annice Needy, LCSW Phone Number: 03/19/2019, 1:15 PM  Clinical Narrative:     LCSW discussed PT recommendation with patient again.  Patient stated that she is going home. Patient stated that "I have committed no crime where I can't go home." She stated that she will discharge to her home.  [name], granddaughter, was informed of patient's choice.  [name] stated that the family could not make patient go to SNF. She stated that they did go to the bank and get the information about the insurance policies. She stated that the policies will be mailed to patient's house and then they will take them to DSS. She stated that DSS has POA paperwork so that they can obtain the documentation as well. She stated that patient's son has said that he will help patient. [name] stated that she will help patient but she cannot stay in the home because of the amount of animals in the home. She stated that patient could also come to live with her and patient's daughter but patient will not.     Expected Discharge Plan: Long Term Nursing Home Barriers to Discharge: Inadequate or no insurance(Patient's LTC Medicaid application is pending. They are still missing documentation related to a life insurance policy. )  Expected Discharge Plan and Services Expected Discharge Plan: Long Term Nursing Home In-house Referral: Chaplain Discharge Planning Services: CM Consult   Living arrangements for the past 2 months: Skilled Nursing Facility, Single Family Home                       HH Agency: [facility]   Social Determinants of Health (SDOH) Interventions    Readmission Risk Interventions Readmission Risk Prevention Plan 03/18/2019  Transportation Screening Complete  HRI or Home Care Consult Complete  SW Recovery  Care/Counseling Consult Complete  Palliative Care Screening Not Applicable  Some recent data might be hidden

## 2019-03-19 NOTE — Progress Notes (Signed)
PROGRESS NOTE    [name] [name]  SUP:[id number] DOB: [birth year] DOA: 03/17/2019 PCP: Oval Linsey, MD    Brief Narrative:  83 year old female with multiple medical problems including diabetes, prior CVA, chronic diastolic heart failure, coronary artery disease, COPD, who had been admitted to [facility] and discharged to skilled nursing facility.  She was subsequently discharged from the skilled nursing facility on recently returned home.  She is brought back to the emergency room with increasing somnolence and worsening mental status.  She was found to have a possible urinary tract infection.  She is currently on antibiotics.  Overall mental status is improving.  Seen by physical therapy who recommended skilled nursing facility placement.   Assessment & Plan:   Principal Problem:   Acute metabolic encephalopathy Active Problems:   Hypothyroidism   Essential hypertension   Acute lower UTI   Uncontrolled type 2 diabetes mellitus with peripheral neuropathy (HCC)   Chronic diastolic CHF (congestive heart failure) (HCC)   1. Acute encephalopathy.  Felt to be multifactorial, possibly related to metabolic from urinary tract infection, as well as a toxic component from medications.  Overall mental status has improved since admission.  ABG did not show any significant CO2 retention. 2. Urinary tract infection.  Currently on Rocephin.  Urinalysis indicated possible infection.  Urine culture did not show any growth.  Will discontinue further antibiotics 3. Chronic diastolic heart failure.  Appears compensated currently.  Last echo was 08/2018 that showed ejection fraction of 60 to 65%.  Continue to monitor volume status. 4. Diabetes.  She has a history of peripheral polyneuropathy.  Currently on sliding scale insulin.  Metformin is currently on hold.  70/30 insulin which has been held.  Blood sugars are currently stable.  Continue to monitor. 5. Left hip pain.  Patient underwent x-ray  imaging of her pelvis as well as CT imaging of her pelvis that did not show any fractures.  She is having difficulty standing related to pain.  MRI of the left hip performed confirms complete tear of left iliopsoas tendon.  Discussed with orthopedics, Dr. Romeo Apple who felt that this injury was nonoperative and recommended supportive measures with pain management and physical therapy.  She was seen by physical therapy who recommended skilled nursing facility placement.  Patient is adamant about being discharged home.  Family eventually agreed to take the patient home with home health services   DVT prophylaxis: SCDs Code Status: Full code Family Communication: No family present Disposition Plan: Discharge home with home health services   Consultants:     Procedures:     Antimicrobials:   Ceftriaxone 3/25 > 3/27   Subjective: Continues to complain of pain in her left leg.  She is getting frustrated by being in the hospital  Objective: Vitals:   03/18/19 1401 03/18/19 2133 03/19/19 0545 03/19/19 1515  BP: (!) 142/69 (!) 169/67 (!) 146/68 (!) 143/79  Pulse: 75 66 67 62  Resp: 19 20 20 19   Temp: 98.6 F (37 C) 98.6 F (37 C) 98.4 F (36.9 C) 98.7 F (37.1 C)  TempSrc: Oral Oral Oral Oral  SpO2: 98% 94% 96% 96%  Weight:      Height:        Intake/Output Summary (Last 24 hours) at 03/19/2019 1828 Last data filed at 03/18/2019 2206 Gross per 24 hour  Intake --  Output 800 ml  Net -800 ml   Filed Weights   03/17/19 1134 03/17/19 1716 03/18/19 0500  Weight: 89.4 kg 90.1  kg 93.5 kg    Examination:  General exam: Alert, awake, oriented x 3 Respiratory system: Clear to auscultation. Respiratory effort normal. Cardiovascular system:RRR. No murmurs, rubs, gallops. Gastrointestinal system: Abdomen is nondistended, soft and nontender. No organomegaly or masses felt. Normal bowel sounds heard. Central nervous system: Alert and oriented. No focal neurological  deficits. Extremities: Tenderness over left hip and left thigh Skin: No rashes, lesions or ulcers Psychiatry: Judgement and insight appear normal. Mood & affect appropriate.      Data Reviewed: I have personally reviewed following labs and imaging studies  CBC: Recent Labs  Lab 03/17/19 1203 03/18/19 0430  WBC 7.6 6.6  NEUTROABS 4.0  --   HGB 12.5 10.6*  HCT 42.1 36.2  MCV 83.5 83.6  PLT 361 306   Basic Metabolic Panel: Recent Labs  Lab 03/17/19 1203 03/18/19 0430  NA 140 139  K 4.4 3.6  CL 105 106  CO2 26 24  GLUCOSE 129* 101*  BUN 10 9  CREATININE 0.74 0.56  CALCIUM 9.5 9.0  MG  --  1.7   GFR: Estimated Creatinine Clearance: 64.6 mL/min (by C-G formula based on SCr of 0.56 mg/dL). Liver Function Tests: Recent Labs  Lab 03/17/19 1203  AST 17  ALT 15  ALKPHOS 52  BILITOT 0.6  PROT 7.1  ALBUMIN 3.8   No results for input(s): LIPASE, AMYLASE in the last 168 hours. No results for input(s): AMMONIA in the last 168 hours. Coagulation Profile: Recent Labs  Lab 03/17/19 1203  INR 1.0   Cardiac Enzymes: Recent Labs  Lab 03/17/19 1203  TROPONINI <0.03   BNP (last 3 results) No results for input(s): PROBNP in the last 8760 hours. HbA1C: No results for input(s): HGBA1C in the last 72 hours. CBG: Recent Labs  Lab 03/18/19 1641 03/18/19 2130 03/19/19 0817 03/19/19 1143 03/19/19 1748  GLUCAP 132* 122* 147* 157* 153*   Lipid Profile: No results for input(s): CHOL, HDL, LDLCALC, TRIG, CHOLHDL, LDLDIRECT in the last 72 hours. Thyroid Function Tests: Recent Labs    03/18/19 0430  TSH 0.558   Anemia Panel: No results for input(s): VITAMINB12, FOLATE, FERRITIN, TIBC, IRON, RETICCTPCT in the last 72 hours. Sepsis Labs: Recent Labs  Lab 03/17/19 1203 03/17/19 1422  LATICACIDVEN 1.2 1.4    Recent Results (from the past 240 hour(s))  Culture, blood (routine x 2)     Status: None (Preliminary result)   Collection Time: 03/17/19 11:44 AM   Result Value Ref Range Status   Specimen Description RIGHT ANTECUBITAL  Final   Special Requests   Final    BOTTLES DRAWN AEROBIC AND ANAEROBIC Blood Culture adequate volume   Culture   Final    NO GROWTH 2 DAYS Performed at [facility], [address] [place], [place] [address]    Report Status PENDING  Incomplete  Culture, blood (routine x 2)     Status: None (Preliminary result)   Collection Time: 03/17/19 11:44 AM  Result Value Ref Range Status   Specimen Description LEFT ANTECUBITAL  Final   Special Requests   Final    BOTTLES DRAWN AEROBIC AND ANAEROBIC Blood Culture adequate volume   Culture   Final    NO GROWTH 2 DAYS Performed at [facility], [address], [place], [place] [address]    Report Status PENDING  Incomplete  Urine culture     Status: None   Collection Time: 03/17/19 12:47 PM  Result Value Ref Range Status   Specimen Description  Final    URINE, CATHETERIZED Performed at [facility], [address] [place], [place] [address]    Special Requests   Final    NONE Performed at [facility], [address] [place], [place] [address]    Culture   Final    NO GROWTH Performed at [facility], [address], [place], [place] [address]    Report Status 03/18/2019 FINAL  Final         Radiology Studies: Mr Hip Left Wo Contrast  Result Date: 03/19/2019 CLINICAL DATA:  Left hip pain and difficulty standing. Injury history is uncertain. EXAM: MR OF THE LEFT HIP WITHOUT CONTRAST TECHNIQUE: Multiplanar, multisequence MR imaging was performed. No intravenous contrast was administered. COMPARISON:  Plain films left hip 03/17/2019. FINDINGS: Bones: No fracture, dislocation or focal lesion. No worrisome lesion. Lower lumbar spondylosis with associated degenerative endplate signal change is noted. Articular cartilage and labrum Articular cartilage:  Appears preserved. Labrum:  The anterior, superior labrum is degenerated. Joint or bursal effusion Joint  effusion:  None. Bursae: Negative. Muscles and tendons Muscles and tendons: The patient has a complete tear of the left iliopsoas tendon from the lesser trochanter. The tendon is retracted 3-4 cm. Edema is seen about the distal tendon and tracks into the muscle belly most consistent with acute or early subacute tear. There is also some edema in the left adductor magnus compatible with strain without tear. Tendinosis of the hamstrings at their origins is worse on the left. Partial tear of the left hamstrings off the ischial tuberosity measures 1.4 cm AP with little to no retraction. Other findings Miscellaneous: Imaged intrapelvic contents demonstrate no acute abnormality. IMPRESSION: Negative for fracture or dislocation. Complete tear of the left ileus psoas tendon from the lesser tuberosity with 3-4 cm of retraction appears acute or early subacute. Chronic appearing tendinosis and partial tear of the left hamstrings from their origin. Edema in the left adductor magnus compatible with strain without tear. Electronically Signed   By: Drusilla Kanner M.D.   On: 03/19/2019 13:22        Scheduled Meds:  aspirin EC  81 mg Oral Daily   citalopram  20 mg Oral Daily   clopidogrel  75 mg Oral Daily   docusate sodium  100 mg Oral Daily   ferrous sulfate  325 mg Oral Q breakfast   insulin aspart  0-15 Units Subcutaneous TID WC   levothyroxine  25 mcg Oral Q0600   methocarbamol  500 mg Oral QID   polyethylene glycol  17 g Oral Daily   simvastatin  20 mg Oral q1800   Continuous Infusions:    LOS: 2 days    Time spent:    Erick Blinks, MD [facility]   If 7PM-7AM, please contact night-coverage [email or web address]  03/19/2019, 6:28 PM

## 2019-03-20 DIAGNOSIS — M25552 Pain in left hip: Secondary | ICD-10-CM

## 2019-03-20 LAB — GLUCOSE, CAPILLARY
Glucose-Capillary: 120 mg/dL — ABNORMAL HIGH (ref 70–99)
Glucose-Capillary: 136 mg/dL — ABNORMAL HIGH (ref 70–99)
Glucose-Capillary: 188 mg/dL — ABNORMAL HIGH (ref 70–99)

## 2019-03-20 NOTE — Progress Notes (Signed)
DC instructions given to pt w/ FU appointment, med changes, diet, and activity level. Ambulated to restroom, no resp distress noted, IV discontinued. Transported via WC to car w/ granddaughter.

## 2019-03-20 NOTE — Progress Notes (Signed)
Up to side of bed, ambulated w/ PT w/o difficulty. Denies SOB, nausea, pain in leg or dizziness. Back in bed. Will continue to monitor

## 2019-03-20 NOTE — Discharge Summary (Signed)
Physician Discharge Summary  [name] [name] AOZ:[id number] DOB: 1936/09/03 DOA: 03/17/2019  PCP: Oval Linsey, MD  Admit date: 03/17/2019 Discharge date: 03/20/2019  Admitted From: home Disposition:  home  Recommendations for Outpatient Follow-up:  1. Follow up with PCP in 1-2 weeks 2. Please obtain BMP/CBC in one week  Home Health:PT recommended SNF, but patient refused. Patient will be discharged home with home health Equipment/Devices:  Discharge Condition: stable CODE STATUS:full code Diet recommendation: heart healthy, carb modified  Brief/Interim Summary: 83 year old female with multiple medical problems including diabetes, prior CVA, chronic diastolic heart failure, coronary arteries, COPD, who had previously been admitted to [facility] and discharged to skilled nursing facility.  After being cared for at the skilled nursing facility, patient was recently discharged home.  She was brought back to the emergency room with increasing somnolence and worsening mental status.  She was also having falls.  She was found to have a possible urinary tract infection and was admitted for further evaluation for mental status  Discharge Diagnoses:  Principal Problem:   Acute metabolic encephalopathy Active Problems:   Hypothyroidism   Essential hypertension   Acute lower UTI   Uncontrolled type 2 diabetes mellitus with peripheral neuropathy (HCC)   Chronic diastolic CHF (congestive heart failure) (HCC)   1. Acute encephalopathy.  Felt to be multifactorial, possibly related to dehydration as well as toxic component from medications.  Overall mental status has improved since admission.  ABG did not show any significant CO2 retention.  Mental status is now back to baseline 2. Urinary tract infection.    She was initially treated with Rocephin.  Urine culture did not show any growth and antibiotics were discontinued 3. Chronic diastolic heart failure.  Appears compensated  currently.  Last echo was 08/2018 that showed ejection fraction of 60 to 65%.  Continue to monitor volume status. 4. Diabetes.  She has a history of peripheral polyneuropathy.    She was treated with sliding scale insulin in the hospital.  Resume 70/30 on discharge.  Will discontinue further metformin. 5. Left hip pain.  Patient underwent x-ray imaging of her pelvis as well as CT imaging of her pelvis that did not show any fractures.  She is having difficulty standing related to pain.  MRI of the left hip performed confirms complete tear of left iliopsoas tendon.  Discussed with orthopedics, Dr. Romeo Apple who felt that this injury was nonoperative and recommended supportive measures with pain management and physical therapy.  She was seen by physical therapy who recommended skilled nursing facility placement.  Patient is adamant about being discharged home.  Family eventually agreed to take the patient home with home health services Discharge Instructions  Discharge Instructions    Diet - low sodium heart healthy   Complete by:  As directed    Increase activity slowly   Complete by:  As directed      Allergies as of 03/20/2019      Reactions   Codeine Itching      Medication List    STOP taking these medications   amLODipine 5 MG tablet Commonly known as:  NORVASC   LORazepam 0.5 MG tablet Commonly known as:  Ativan   metFORMIN 500 MG tablet Commonly known as:  GLUCOPHAGE     TAKE these medications   acetaminophen 325 MG tablet Commonly known as:  TYLENOL Take 2 tablets (650 mg total) by mouth every 6 (six) hours as needed for mild pain or headache (or Fever >/= 101).  aspirin EC 81 MG tablet Take 1 tablet (81 mg total) by mouth daily.   benzonatate 200 MG capsule Commonly known as:  TESSALON Take 1 capsule (200 mg total) by mouth 3 (three) times daily as needed for cough.   citalopram 20 MG tablet Commonly known as:  CELEXA Take 1 tablet (20 mg total) by mouth daily.    clopidogrel 75 MG tablet Commonly known as:  PLAVIX Take 1 tablet (75 mg total) by mouth daily for 30 days.   docusate sodium 100 MG capsule Commonly known as:  COLACE Take 100 mg by mouth daily.   ferrous sulfate 325 (65 FE) MG tablet Take 1 tablet (325 mg total) by mouth daily with breakfast.   gabapentin 300 MG capsule Commonly known as:  NEURONTIN Take 1 capsule (300 mg total) by mouth 2 (two) times daily.   Insulin Isophane & Regular Human (70-30) 100 UNIT/ML PEN Commonly known as:  HumuLIN 70/30 KwikPen Inject 25 Units into the skin 2 (two) times daily. What changed:  how much to take   ipratropium-albuterol 0.5-2.5 (3) MG/3ML Soln Commonly known as:  DUONEB Inhale 3 mLs into the lungs 4 (four) times daily as needed (for shortness of breath).   levothyroxine 25 MCG tablet Commonly known as:  SYNTHROID, LEVOTHROID Take 1 tablet (25 mcg total) by mouth daily.   lisinopril 10 MG tablet Commonly known as:  PRINIVIL,ZESTRIL Take 1 tablet (10 mg total) by mouth daily.   nitroGLYCERIN 0.4 MG SL tablet Commonly known as:  NITROSTAT Place 1 tablet (0.4 mg total) under the tongue every 5 (five) minutes as needed for chest pain.   oxyCODONE 5 MG immediate release tablet Commonly known as:  Oxy IR/ROXICODONE Take 1 tablet (5 mg total) by mouth every 6 (six) hours as needed for severe pain.   pantoprazole 40 MG tablet Commonly known as:  PROTONIX Take 1 tablet (40 mg total) by mouth daily at 6 (six) AM.   simvastatin 20 MG tablet Commonly known as:  ZOCOR Take 1 tablet (20 mg total) by mouth daily.   UNABLE TO FIND CPAP from home and previous home settings while sleeping       Allergies  Allergen Reactions  . Codeine Itching    Consultations:     Procedures/Studies: Dg Chest 1 View  Result Date: 03/17/2019 CLINICAL DATA:  Altered mental status. EXAM: CHEST  1 VIEW COMPARISON:  02/12/2019 FINDINGS: Postsurgical changes from CABG. The cardiac silhouette is  enlarged. There is an apparent widening of the mediastinum, when compared to patient's prior radiographs. There is no evidence of focal airspace consolidation, or pneumothorax. Possible right pleural effusion. Osseous structures are without acute abnormality. Soft tissues are grossly normal. IMPRESSION: 1. Apparent widening of the mediastinum, when compared to patient's prior radiographs. This may be due to poor inspiratory effort, however vascular anomaly can not be excluded. Please correlate to patient's symptomatology. If the patient displays signs of acute aortic syndrome, then CT angiogram of the chest should be performed. Otherwise PA and lateral radiograph the chest may be considered. 2. Possible right pleural effusion. These results were called by telephone at the time of interpretation on 03/17/2019 at 1:43 pm to Dr. Samuel Jester , who verbally acknowledged these results. Electronically Signed   By: Ted Mcalpine M.D.   On: 03/17/2019 13:45   Ct Head Wo Contrast  Result Date: 03/17/2019 CLINICAL DATA:  Explain confusion. EXAM: CT HEAD WITHOUT CONTRAST CT CERVICAL SPINE WITHOUT CONTRAST TECHNIQUE: Multidetector CT imaging of  the head and cervical spine was performed following the standard protocol without intravenous contrast. Multiplanar CT image reconstructions of the cervical spine were also generated. COMPARISON:  Brain MRI 02/17/2019, cervical spine MRI 02/18/2019 FINDINGS: CT HEAD FINDINGS Brain: No evidence of acute infarction, hemorrhage, hydrocephalus, extra-axial collection or mass lesion/mass effect. Moderate brain parenchymal volume loss and deep white matter microangiopathy. Vascular: Calcific atherosclerotic disease of the intra cavernous carotid arteries. Skull: Normal. Negative for fracture or focal lesion. Sinuses/Orbits: No acute finding. Other: None CT CERVICAL SPINE FINDINGS Alignment: Normal. Skull base and vertebrae: No acute fracture. No primary bone lesion or focal  pathologic process. Soft tissues and spinal canal: No prevertebral fluid or swelling. No visible canal hematoma. Disc levels:  Multilevel osteoarthritic changes. Upper chest: Negative. Other: IMPRESSION: 1. No acute intracranial abnormality. 2. Atrophy, chronic microvascular disease. 3. No evidence of acute traumatic injury to cervical spine. 4. Multilevel osteoarthritic changes of the cervical spine. Electronically Signed   By: Ted Mcalpine M.D.   On: 03/17/2019 13:28   Ct Cervical Spine Wo Contrast  Result Date: 03/17/2019 CLINICAL DATA:  Explain confusion. EXAM: CT HEAD WITHOUT CONTRAST CT CERVICAL SPINE WITHOUT CONTRAST TECHNIQUE: Multidetector CT imaging of the head and cervical spine was performed following the standard protocol without intravenous contrast. Multiplanar CT image reconstructions of the cervical spine were also generated. COMPARISON:  Brain MRI 02/17/2019, cervical spine MRI 02/18/2019 FINDINGS: CT HEAD FINDINGS Brain: No evidence of acute infarction, hemorrhage, hydrocephalus, extra-axial collection or mass lesion/mass effect. Moderate brain parenchymal volume loss and deep white matter microangiopathy. Vascular: Calcific atherosclerotic disease of the intra cavernous carotid arteries. Skull: Normal. Negative for fracture or focal lesion. Sinuses/Orbits: No acute finding. Other: None CT CERVICAL SPINE FINDINGS Alignment: Normal. Skull base and vertebrae: No acute fracture. No primary bone lesion or focal pathologic process. Soft tissues and spinal canal: No prevertebral fluid or swelling. No visible canal hematoma. Disc levels:  Multilevel osteoarthritic changes. Upper chest: Negative. Other: IMPRESSION: 1. No acute intracranial abnormality. 2. Atrophy, chronic microvascular disease. 3. No evidence of acute traumatic injury to cervical spine. 4. Multilevel osteoarthritic changes of the cervical spine. Electronically Signed   By: Ted Mcalpine M.D.   On: 03/17/2019 13:28   Mr  Hip Left Wo Contrast  Result Date: 03/19/2019 CLINICAL DATA:  Left hip pain and difficulty standing. Injury history is uncertain. EXAM: MR OF THE LEFT HIP WITHOUT CONTRAST TECHNIQUE: Multiplanar, multisequence MR imaging was performed. No intravenous contrast was administered. COMPARISON:  Plain films left hip 03/17/2019. FINDINGS: Bones: No fracture, dislocation or focal lesion. No worrisome lesion. Lower lumbar spondylosis with associated degenerative endplate signal change is noted. Articular cartilage and labrum Articular cartilage:  Appears preserved. Labrum:  The anterior, superior labrum is degenerated. Joint or bursal effusion Joint effusion:  None. Bursae: Negative. Muscles and tendons Muscles and tendons: The patient has a complete tear of the left iliopsoas tendon from the lesser trochanter. The tendon is retracted 3-4 cm. Edema is seen about the distal tendon and tracks into the muscle belly most consistent with acute or early subacute tear. There is also some edema in the left adductor magnus compatible with strain without tear. Tendinosis of the hamstrings at their origins is worse on the left. Partial tear of the left hamstrings off the ischial tuberosity measures 1.4 cm AP with little to no retraction. Other findings Miscellaneous: Imaged intrapelvic contents demonstrate no acute abnormality. IMPRESSION: Negative for fracture or dislocation. Complete tear of the left ileus psoas tendon from  the lesser tuberosity with 3-4 cm of retraction appears acute or early subacute. Chronic appearing tendinosis and partial tear of the left hamstrings from their origin. Edema in the left adductor magnus compatible with strain without tear. Electronically Signed   By: Drusilla Kanner M.D.   On: 03/19/2019 13:22   Ct Angio Chest/abd/pel For Dissection W And/or W/wo  Result Date: 03/17/2019 CLINICAL DATA:  83 year old female complaining of back pain. Recent history of fall. EXAM: CT ANGIOGRAPHY CHEST, ABDOMEN  AND PELVIS TECHNIQUE: Multidetector CT imaging through the chest, abdomen and pelvis was performed using the standard protocol during bolus administration of intravenous contrast. Multiplanar reconstructed images and MIPs were obtained and reviewed to evaluate the vascular anatomy. CONTRAST:  OMNIPAQUE IOHEXOL 350 MG/ML SOLN COMPARISON:  No prior chest CT.  CT abdomen and pelvis 05/19/2017. FINDINGS: CTA CHEST FINDINGS Cardiovascular: Heart size is mildly enlarged. There is no significant pericardial fluid, thickening or pericardial calcification. There is aortic atherosclerosis, as well as atherosclerosis of the great vessels of the mediastinum and the coronary arteries, including calcified atherosclerotic plaque in the left main, left anterior descending, left circumflex and right coronary arteries. Status post median sternotomy for CABG including LIMA to the LAD. On precontrast images there is no crescentic high attenuation associated with the wall of the thoracic aorta to suggest acute intramural hemorrhage. No aortic aneurysm or dissection. Calcifications of the aortic valve and mitral annulus. Mediastinum/Nodes: No pathologically enlarged mediastinal or hilar lymph nodes. Esophagus is normal in appearance. No axillary lymphadenopathy. Lungs/Pleura: Scattered areas of linear scarring are noted in the lungs bilaterally, most evident near the lung apices and in the lung bases. Areas of mild dependent subsegmental atelectasis also noted in the lower lobes of the lungs bilaterally. No acute consolidative airspace disease. No pleural effusions. No definite suspicious appearing pulmonary nodules or masses are noted. Musculoskeletal: Median sternotomy wires. There are no aggressive appearing lytic or blastic lesions noted in the visualized portions of the skeleton. Review of the MIP images confirms the above findings. CTA ABDOMEN AND PELVIS FINDINGS VASCULAR Aorta: Normal caliber aorta without aneurysm,  dissection, vasculitis or significant stenosis. Celiac: Patent without evidence of aneurysm, dissection, vasculitis or significant stenosis. SMA: Patent without evidence of aneurysm, dissection, vasculitis or significant stenosis. Renals: Both renal arteries are patent without evidence of aneurysm, dissection, vasculitis, fibromuscular dysplasia or significant stenosis. IMA: Patent without evidence of aneurysm, dissection, vasculitis or significant stenosis. Inflow: Patent without evidence of aneurysm, dissection, vasculitis or significant stenosis. Veins: No obvious venous abnormality within the limitations of this arterial phase study. Review of the MIP images confirms the above findings. NON-VASCULAR Hepatobiliary: No suspicious cystic or solid hepatic lesions. Status post cholecystectomy. No intrahepatic biliary ductal dilatation. Common bile duct measures 7 mm in the porta hepatis, within normal limits for this post cholecystectomy patient. Pancreas: No pancreatic mass. No pancreatic ductal dilatation. No pancreatic or peripancreatic fluid or inflammatory changes. Spleen: Unremarkable. Adrenals/Urinary Tract: Low-attenuation lesions in both kidneys, compatible with simple cysts, largest of which measures 2.1 cm in the upper pole the left kidney. Multifocal cortical thinning in the right kidney, presumably chronic scarring. No definite suspicious renal lesions. No hydroureteronephrosis. Urinary bladder is normal in appearance. Bilateral adrenal glands are normal in appearance. Stomach/Bowel: Normal appearance of the stomach. No pathologic dilatation of small bowel or colon. The appendix is not confidently identified and may be surgically absent. Regardless, there are no inflammatory changes noted adjacent to the cecum to suggest the presence of an acute appendicitis  at this time. Lymphatic: No lymphadenopathy noted in the abdomen or pelvis. Reproductive: Status post hysterectomy. Ovaries are not confidently  identified may be surgically absent or trophic. Other: No significant volume of ascites.  No pneumoperitoneum. Musculoskeletal: Median sternotomy wires. No acute displaced fractures or aggressive appearing lytic or blastic lesions are noted in the visualized portions of the skeleton. Review of the MIP images confirms the above findings. IMPRESSION: 1. No acute findings are noted in the chest, abdomen or pelvis to account for the patient's symptoms. Specifically, no evidence of acute aortic syndrome. 2. Mild cardiomegaly. 3. Aortic atherosclerosis, in addition to left main and 3 vessel coronary artery disease. Status post median sternotomy for CABG including LIMA to the LAD. 4. There are calcifications of the aortic valve and mitral annulus. Echocardiographic correlation for evaluation of potential valvular dysfunction may be warranted if clinically indicated. 5. Additional incidental findings, as above. Aortic Atherosclerosis (ICD10-I70.0). Electronically Signed   By: Trudie Reedaniel  Entrikin M.D.   On: 03/17/2019 15:50   Dg Hips Bilat W Or Wo Pelvis 5 Views  Result Date: 03/17/2019 CLINICAL DATA:  Bilateral hip pain EXAM: DG HIP (WITH OR WITHOUT PELVIS) 5+V BILAT COMPARISON:  None. FINDINGS: There is no evidence of hip fracture or dislocation. There is no evidence of arthropathy or other focal bone abnormality. Peripheral vascular atherosclerotic disease. IMPRESSION: No acute osseous injury of the bilateral hips. Electronically Signed   By: Elige KoHetal  Patel   On: 03/17/2019 13:34       Subjective: Continues to have some pain in left hip.  Has difficulty ambulating.  Discharge Exam: Vitals:   03/19/19 1515 03/19/19 2112 03/20/19 0547 03/20/19 1428  BP: (!) 143/79 99/64 (!) 131/55 (!) 133/59  Pulse: 62 63 71 60  Resp: 19 18 20 16   Temp: 98.7 F (37.1 C) 98.2 F (36.8 C) 98.1 F (36.7 C) 98.4 F (36.9 C)  TempSrc: Oral Oral Oral Oral  SpO2: 96% 98% 96% 94%  Weight:   90.4 kg   Height:        General:  Pt is alert, awake, not in acute distress Cardiovascular: RRR, S1/S2 +, no rubs, no gallops Respiratory: CTA bilaterally, no wheezing, no rhonchi Abdominal: Soft, NT, ND, bowel sounds + Extremities: no edema, no cyanosis, pain with movement in left hip    The results of significant diagnostics from this hospitalization (including imaging, microbiology, ancillary and laboratory) are listed below for reference.     Microbiology: Recent Results (from the past 240 hour(s))  Culture, blood (routine x 2)     Status: None (Preliminary result)   Collection Time: 03/17/19 11:44 AM  Result Value Ref Range Status   Specimen Description RIGHT ANTECUBITAL  Final   Special Requests   Final    BOTTLES DRAWN AEROBIC AND ANAEROBIC Blood Culture adequate volume   Culture   Final    NO GROWTH 3 DAYS Performed at [facility], [address] [place], [place] [address]    Report Status PENDING  Incomplete  Culture, blood (routine x 2)     Status: None (Preliminary result)   Collection Time: 03/17/19 11:44 AM  Result Value Ref Range Status   Specimen Description LEFT ANTECUBITAL  Final   Special Requests   Final    BOTTLES DRAWN AEROBIC AND ANAEROBIC Blood Culture adequate volume   Culture   Final    NO GROWTH 3 DAYS Performed at [facility], [address] [place], [place] [address]    Report Status PENDING  Incomplete  Urine culture     Status: None   Collection Time: 03/17/19 12:47 PM  Result Value Ref Range Status   Specimen Description   Final    URINE, CATHETERIZED Performed at [facility], [address] [place], [place] [address]    Special Requests   Final    NONE Performed at [facility], [address], [place], [place] [address]    Culture   Final    NO GROWTH Performed at [facility], [address] [place], [place] [address]    Report Status 03/18/2019 FINAL  Final     Labs: BNP (last 3 results) Recent Labs    09/13/18 0026 12/13/18 0419  BNP 63.0 475.0*    Basic Metabolic Panel: Recent Labs  Lab 03/17/19 1203 03/18/19 0430  NA 140 139  K 4.4 3.6  CL 105 106  CO2 26 24  GLUCOSE 129* 101*  BUN 10 9  CREATININE 0.74 0.56  CALCIUM 9.5 9.0  MG  --  1.7   Liver Function Tests: Recent Labs  Lab 03/17/19 1203  AST 17  ALT 15  ALKPHOS 52  BILITOT 0.6  PROT 7.1  ALBUMIN 3.8   No results for input(s): LIPASE, AMYLASE in the last 168 hours. No results for input(s): AMMONIA in the last 168 hours. CBC: Recent Labs  Lab 03/17/19 1203 03/18/19 0430  WBC 7.6 6.6  NEUTROABS 4.0  --   HGB 12.5 10.6*  HCT 42.1 36.2  MCV 83.5 83.6  PLT 361 306   Cardiac Enzymes: Recent Labs  Lab 03/17/19 1203  TROPONINI <0.03   BNP: Invalid input(s): POCBNP CBG: Recent Labs  Lab 03/19/19 1748 03/19/19 2058 03/20/19 0505 03/20/19 0737 03/20/19 1106  GLUCAP 153* 117* 120* 136* 188*   D-Dimer No results for input(s): DDIMER in the last 72 hours. Hgb A1c No results for input(s): HGBA1C in the last 72 hours. Lipid Profile No results for input(s): CHOL, HDL, LDLCALC, TRIG, CHOLHDL, LDLDIRECT in the last 72 hours. Thyroid function studies Recent Labs    03/18/19 0430  TSH 0.558   Anemia work up No results for input(s): VITAMINB12, FOLATE, FERRITIN, TIBC, IRON, RETICCTPCT in the last 72 hours. Urinalysis    Component Value Date/Time   COLORURINE YELLOW 03/17/2019 1247   APPEARANCEUR CLOUDY (A) 03/17/2019 1247   LABSPEC 1.018 03/17/2019 1247   PHURINE 5.0 03/17/2019 1247   GLUCOSEU NEGATIVE 03/17/2019 1247   HGBUR NEGATIVE 03/17/2019 1247   BILIRUBINUR NEGATIVE 03/17/2019 1247   KETONESUR 5 (A) 03/17/2019 1247   PROTEINUR 30 (A) 03/17/2019 1247   UROBILINOGEN 0.2 08/29/2015 1842   NITRITE NEGATIVE 03/17/2019 1247   LEUKOCYTESUR MODERATE (A) 03/17/2019 1247   Sepsis Labs Invalid input(s): PROCALCITONIN,  WBC,  LACTICIDVEN Microbiology Recent Results (from the past 240 hour(s))  Culture, blood (routine x 2)     Status:  None (Preliminary result)   Collection Time: 03/17/19 11:44 AM  Result Value Ref Range Status   Specimen Description RIGHT ANTECUBITAL  Final   Special Requests   Final    BOTTLES DRAWN AEROBIC AND ANAEROBIC Blood Culture adequate volume   Culture   Final    NO GROWTH 3 DAYS Performed at [facility], [address] [place], [place] [address]    Report Status PENDING  Incomplete  Culture, blood (routine x 2)     Status: None (Preliminary result)   Collection Time: 03/17/19 11:44 AM  Result Value Ref Range Status   Specimen Description  LEFT ANTECUBITAL  Final   Special Requests   Final    BOTTLES DRAWN AEROBIC AND ANAEROBIC Blood Culture adequate volume   Culture   Final    NO GROWTH 3 DAYS Performed at [facility], [address] [place], [place] [address]    Report Status PENDING  Incomplete  Urine culture     Status: None   Collection Time: 03/17/19 12:47 PM  Result Value Ref Range Status   Specimen Description   Final    URINE, CATHETERIZED Performed at [facility], [address] [place], [place] [address]    Special Requests   Final    NONE Performed at [facility], [address], [place], [place] [address]    Culture   Final    NO GROWTH Performed at [facility], [address], [place], [place] [address]    Report Status 03/18/2019 FINAL  Final     Time coordinating discharge:  SIGNED:   Erick Blinks, MD  [facility] 03/20/2019, 7:21 PM   If 7PM-7AM, please contact night-coverage [email or web address]

## 2019-03-20 NOTE — TOC Transition Note (Signed)
Transition of Care [facility]) - CM/SW Discharge Note   Patient Details  Name: [name] [name] MRN: [id number] Date of Birth: 1936-08-06  Transition of Care [facility]) CM/SW Contact:  Virgel Manifold, RN Phone Number: 03/20/2019, 2:44 PM   Clinical Narrative:   Patient to be discharged per MD order. Orders in place for home health services. Patient active with [facility]. Resumption of care confirmed with Barbara Cower from [facility]. No DME needs. Family to transport.     Final next level of care: Home w Home Health Services Barriers to Discharge: No Barriers Identified   Patient Goals and CMS Choice Patient states their goals for this hospitalization and ongoing recovery are:: Patient states that she plans on returning home at discharge.  CMS Medicare.gov Compare Post Acute Care list provided to:: Patient Choice offered to / list presented to : Patient  Discharge Placement                       Discharge Plan and Services In-house Referral: Chaplain Discharge Planning Services: CM Consult Post Acute Care Choice: Resumption of Svcs/PTA Provider              HH Arranged: RN, PT, Nurse's Aide HH Agency: [facility]   Social Determinants of Health (SDOH) Interventions     Readmission Risk Interventions Readmission Risk Prevention Plan 03/18/2019  Transportation Screening Complete  HRI or Home Care Consult Complete  SW Recovery Care/Counseling Consult Complete  Palliative Care Screening Not Applicable  Some recent data might be hidden

## 2019-03-20 NOTE — Progress Notes (Signed)
Physical Therapy Treatment Patient Details Name: [name] [name] MRN: [id number] DOB: October 01, 1936 Today's Date: 03/20/2019    History of Present Illness [name] [name] is a 83 y.o. female with medical history significant for coronary artery disease status post CABG in 2001, chronic diastolic heart failure, ischemic CVA in 2013 with residual left-sided facial numbness, type 2 diabetes mellitus complicated by peripheral neuropathy, obstructive sleep apnea noncompliant with home CPAP, acquired hypothyroidism, chronic pain syndrome, COPD, who is admitted to [facility] on 03/17/2019 with acute encephalopathy after presenting from home to [facility] ED for evaluation of confusion.    PT Comments    Patient required assistance to move LLE during bed mobility due to increased left hip/thigh pain with guarding, active assistance to complete seated marches with LLE due to pain/weakness, demonstrated increased endurance/distance for ambulation in room/hallway with fair/good return for advancing LLE after taking first few steps, had difficulty with advancing LLE mostly due to a lack of ankle dorsiflexion.  Patient tolerated sitting up in chair and required legs elevated to reduce left hip/thigh pain.  Patient will benefit from continued physical therapy in hospital and recommended venue below to increase strength, balance, endurance for safe ADLs and gait.    Follow Up Recommendations  Supervision for mobility/OOB;Supervision/Assistance - 24 hour;Supervision - Intermittent;SNF     Equipment Recommendations  None recommended by PT    Recommendations for Other Services       Precautions / Restrictions Precautions Precautions: Fall Restrictions Weight Bearing Restrictions: No    Mobility  Bed Mobility Overal bed mobility: Needs Assistance Bed Mobility: Supine to Sit     Supine to sit: Min assist;Mod assist     General bed mobility comments: limited due to guarding LLE when  painful  Transfers Overall transfer level: Needs assistance Equipment used: Rolling walker (2 wheeled) Transfers: Sit to/from [employer] Sit to Stand: Min assist Stand pivot transfers: Min assist       General transfer comment: slow labored movement with difficulty advancing LLE  Ambulation/Gait Ambulation/Gait assistance: Min assist Gait Distance (Feet): 40 Feet Assistive device: Rolling walker (2 wheeled) Gait Pattern/deviations: Decreased step length - left;Decreased stance time - left;Decreased stride length Gait velocity: decreased   General Gait Details: increased endurance/distance for ambulation with slow labored cadence decreased step/stride length LLE mostly due to lack of ankle dorsiflexion, after a few steps able to increased stride length with LLE, limited secondary to c/o fatigue   Stairs             Wheelchair Mobility    Modified Rankin (Stroke Patients Only)       Balance Overall balance assessment: Needs assistance Sitting-balance support: Feet supported;No upper extremity supported Sitting balance-Leahy Scale: Good     Standing balance support: Bilateral upper extremity supported;During functional activity Standing balance-Leahy Scale: Fair Standing balance comment: w/ RW                            Cognition Arousal/Alertness: Awake/alert Behavior During Therapy: WFL for tasks assessed/performed Overall Cognitive Status: Within Functional Limits for tasks assessed                                        Exercises General Exercises - Lower Extremity Long Arc Quad: Seated;AROM;Strengthening;Both;10 reps Hip Flexion/Marching: Seated;AROM;Strengthening;Both;10 reps;AAROM Toe Raises: Seated;AROM;Strengthening;Right;10 reps;Both Heel Raises: Seated;AROM;Strengthening;Right;10 reps;Both  General Comments        Pertinent Vitals/Pain Pain Assessment: Faces Faces Pain Scale: Hurts even  more Pain Location: left thigh and knee pain Pain Descriptors / Indicators: Sharp;Grimacing;Guarding;Discomfort Pain Intervention(s): Limited activity within patient's tolerance;Monitored during session;Repositioned;Premedicated before session    Home Living                      Prior Function            PT Goals (current goals can now be found in the care plan section) Acute Rehab PT Goals Patient Stated Goal: return home with family to assist PT Goal Formulation: With patient Time For Goal Achievement: 03/25/19 Potential to Achieve Goals: Good Progress towards PT goals: Progressing toward goals    Frequency    Min 3X/week      PT Plan Current plan remains appropriate    Co-evaluation              AM-PAC PT "6 Clicks" Mobility   Outcome Measure  Help needed turning from your back to your side while in a flat bed without using bedrails?: A Little Help needed moving from lying on your back to sitting on the side of a flat bed without using bedrails?: A Little Help needed moving to and from a bed to a chair (including a wheelchair)?: A Little Help needed standing up from a chair using your arms (e.g., wheelchair or bedside chair)?: A Little Help needed to walk in hospital room?: A Lot Help needed climbing 3-5 steps with a railing? : A Lot 6 Click Score: 16    End of Session Equipment Utilized During Treatment: Gait belt Activity Tolerance: Patient tolerated treatment well;Patient limited by pain Patient left: in bed;with call bell/phone within reach;with bed alarm set Nurse Communication: Mobility status PT Visit Diagnosis: Unsteadiness on feet (R26.81);Muscle weakness (generalized) (M62.81);Other abnormalities of gait and mobility (R26.89);Pain;Repeated falls (R29.6);History of falling (Z91.81);Difficulty in walking, not elsewhere classified (R26.2) Pain - Right/Left: Left Pain - part of body: Knee;Leg     Time: 5170-0174 PT Time Calculation (min)  (ACUTE ONLY): 32 min  Charges:  $Gait Training: 8-22 mins $Therapeutic Exercise: 8-22 mins                     1:10 PM, 03/20/19 [name] [name], MPT Physical Therapist with [facility] [phone] office [phone] mobile phone

## 2019-03-22 LAB — CULTURE, BLOOD (ROUTINE X 2)
Culture: NO GROWTH
Culture: NO GROWTH
Special Requests: ADEQUATE
Special Requests: ADEQUATE

## 2019-03-30 ENCOUNTER — Emergency Department (HOSPITAL_COMMUNITY)
Admission: EM | Admit: 2019-03-30 | Discharge: 2019-03-31 | Disposition: A | Discharge status: Home / Self Care | Payer: Medicare HMO | Attending: Emergency Medicine | Admitting: Emergency Medicine

## 2019-03-30 ENCOUNTER — Encounter (HOSPITAL_COMMUNITY): Payer: Self-pay | Admitting: Emergency Medicine

## 2019-03-30 ENCOUNTER — Other Ambulatory Visit: Payer: Self-pay

## 2019-03-30 DIAGNOSIS — J449 Chronic obstructive pulmonary disease, unspecified: Secondary | ICD-10-CM | POA: Insufficient documentation

## 2019-03-30 DIAGNOSIS — M25552 Pain in left hip: Secondary | ICD-10-CM | POA: Diagnosis not present

## 2019-03-30 DIAGNOSIS — E1140 Type 2 diabetes mellitus with diabetic neuropathy, unspecified: Secondary | ICD-10-CM | POA: Diagnosis not present

## 2019-03-30 DIAGNOSIS — Y929 Unspecified place or not applicable: Secondary | ICD-10-CM | POA: Diagnosis not present

## 2019-03-30 DIAGNOSIS — I110 Hypertensive heart disease with heart failure: Secondary | ICD-10-CM | POA: Insufficient documentation

## 2019-03-30 DIAGNOSIS — S0990XA Unspecified injury of head, initial encounter: Secondary | ICD-10-CM | POA: Insufficient documentation

## 2019-03-30 DIAGNOSIS — Y939 Activity, unspecified: Secondary | ICD-10-CM | POA: Diagnosis not present

## 2019-03-30 DIAGNOSIS — E039 Hypothyroidism, unspecified: Secondary | ICD-10-CM | POA: Insufficient documentation

## 2019-03-30 DIAGNOSIS — Y999 Unspecified external cause status: Secondary | ICD-10-CM | POA: Insufficient documentation

## 2019-03-30 DIAGNOSIS — I5032 Chronic diastolic (congestive) heart failure: Secondary | ICD-10-CM | POA: Diagnosis not present

## 2019-03-30 DIAGNOSIS — W19XXXA Unspecified fall, initial encounter: Secondary | ICD-10-CM | POA: Diagnosis not present

## 2019-03-30 DIAGNOSIS — S4992XA Unspecified injury of left shoulder and upper arm, initial encounter: Secondary | ICD-10-CM | POA: Diagnosis not present

## 2019-03-30 DIAGNOSIS — R51 Headache: Secondary | ICD-10-CM | POA: Diagnosis not present

## 2019-03-30 NOTE — ED Triage Notes (Addendum)
Pt C/O fall that occurred 2 days ago. Pt states the pain medication she is receiving at home is not working. Pt was not seen for the fall. Pt C?O left hip pain, left shoulder pain, and left foot pain. No deformity noted.

## 2019-03-31 ENCOUNTER — Emergency Department (HOSPITAL_COMMUNITY): Payer: Medicare HMO

## 2019-03-31 MED ORDER — OXYCODONE-ACETAMINOPHEN 5-325 MG PO TABS: 1.0000 | ORAL_TABLET | Freq: Three times a day (TID) | ORAL | 0 refills | Status: DC | PRN

## 2019-03-31 MED ORDER — OXYCODONE-ACETAMINOPHEN 5-325 MG PO TABS
1.0000 | ORAL_TABLET | Freq: Once | ORAL | Status: AC
  Administered 2019-03-31: 1 via ORAL
  Filled 2019-03-31: qty 1

## 2019-03-31 MED ORDER — KETOROLAC TROMETHAMINE 60 MG/2ML IM SOLN
30.0000 mg | Freq: Once | INTRAMUSCULAR | Status: AC
  Administered 2019-03-31: 30 mg via INTRAMUSCULAR
  Filled 2019-03-31: qty 2

## 2019-03-31 NOTE — ED Notes (Signed)
Pt unable to sign Verbally understands discharge information

## 2019-03-31 NOTE — Progress Notes (Signed)
CSW contacted by Pt's family. Family verbalized frustration and exhaustion as they explained they did not feel that they could care for Pt in a home setting.   CSW infomed family of the risk by allowing Pt to have prolonged exposure in the ED. CSW actively listened as family voiced that they were experiencing grief.   CSW educated family of their options regaurding placement. CSW expressed that pt did not have a medical need to be seen at the ED. CSW went into detail and explained that if family were to bring Pt into ED, once medically cleared, Pt would again, be discharged.   CSW assisted family in navigating the medicare.gov website after learning that family was unsatisfied with their PCP.   CSW encouraged family to pursue long term care placement through their primary care doctor. CSW also encouraged family to seek the assistance of DSS with the LTC medicaid application.   Family agreeable and concluded conversation. No questions or concerns.   Delvis Kau Sherryle Lis LCSWA Transitions of Care  Clinical Social Worker  Ph: [phone]

## 2019-03-31 NOTE — ED Provider Notes (Signed)
Emergency Department Provider Note   I have reviewed the triage vital signs and the nursing notes.   HISTORY  Chief Complaint Fall   HPI [name] [name] is a 83 y.o. female who has a long history of opioid tolerance and pain related/fall related visits who presents to the emergency department today secondary to headache that is nonradiating, afebrile, no neurologic changes or visual loss.  She also states she has left elbow pain and left upper leg pain from a fall couple days ago.  She does not think they are broken but it does make it difficult for her to walk as she is out of her pain medications at home.   No other associated or modifying symptoms.    Past Medical History:  Diagnosis Date   Arthritis    CAD (coronary artery disease)    Multivessel status post CABG 2001   Chronic obstructive pulmonary disease (HCC)    Chronic pain    DDD (degenerative disc disease), lumbar    Diastolic CHF (HCC)    Essential hypertension    Hematuria    History of CVA (cerebrovascular accident)    2013 - lacunar infarct right thalamus - residual left side of  lip numb  and left eye vision worse (which corrented lens implant from cataract extraction)   History of MI (myocardial infarction)    July 2001   Hyperlipidemia    Hypothyroidism    Insulin dependent type 2 diabetes mellitus (HCC)    Lesion of bladder    Lower urinary tract symptoms (LUTS)    OSA (obstructive sleep apnea)    Peripheral neuropathy    Psoriasis    SUI (stress urinary incontinence, female)    Varicose veins     Patient Active Problem List   Diagnosis Date Noted   Acute metabolic encephalopathy 03/17/2019   Chronic diastolic CHF (congestive heart failure) (HCC) 03/17/2019   Iron deficiency anemia 03/12/2019   Protein-calorie malnutrition, moderate (HCC) 03/12/2019   Hypertension associated with type 2 diabetes mellitus (HCC) 03/09/2019   GERD without esophagitis 03/09/2019    Dyslipidemia associated with type 2 diabetes mellitus (HCC) 03/09/2019   Uncontrolled type 2 diabetes mellitus with peripheral neuropathy (HCC) 03/09/2019   Major depression, recurrent, chronic (HCC) 03/09/2019   Hypoglycemia 02/22/2019   Generalized weakness 02/12/2019   Fall at home 02/12/2019   Symptomatic Yeast UTI 02/12/2019   Leukocytosis 02/12/2019   AKI (acute kidney injury) (HCC) 02/12/2019   COPD with acute exacerbation (HCC) 12/13/2018   Syncope    Loss of consciousness (HCC) 06/10/2018   Bradycardia, sinus 06/10/2018   Diastolic dysfunction 09/29/2017   CAD (coronary artery disease) 09/28/2017   Acute lower UTI 09/28/2017   Ischemic stroke (HCC) 05/10/2017   Acute ischemic stroke (HCC) 05/10/2017   Dysarthria 03/18/2016   Anemia of chronic disease 11/19/2015   OSA (obstructive sleep apnea) 11/19/2015   Left facial numbness 08/29/2015   Gait instability 06/26/2015   COPD exacerbation (HCC) 11/13/2014   Weakness generalized 12/10/2013   TIA (transient ischemic attack) 06/22/2012   Left-sided headache 06/22/2012   Noncompliance 06/22/2012   Noncompliance with CPAP treatment 06/22/2012   Psoriasis 01/07/2012   Chronic back pain    Chronic obstructive pulmonary disease (HCC)    DM type 2 causing complication (HCC) 02/14/2010   Obesity 02/14/2010   Arteriosclerotic cardiovascular disease (ASCVD) 02/14/2010   Hypothyroidism 02/06/2010   Hyperlipidemia 02/06/2010   Essential hypertension 02/06/2010   Sleep apnea 02/06/2010    Past Surgical  History:  Procedure Laterality Date   APPENDECTOMY  age 67   CARDIAC CATHETERIZATION  1991   No sig. cad   CARDIAC CATHETERIZATION  07-01-2000   high grade in-stent restenosis   CARDIAC CATHETERIZATION  04-30-2001   single native vessel cad/ graft patent   CARDIAC CATHETERIZATION  02-13-2006  dr [place] cad with total occluded LAD  and  70% ostial of small ramus/  grafts patent/   normal ef   CARDIAC CATHETERIZATION  01-20-2000  dr Clifton James   essentially unchanged;  severe single vessel cad with diffuse disease throughout CFX and RCA/ ef 66%;  chf with preserved LVSF   CARDIOVASCULAR STRESS TEST  08-28-2011   DR ROTHBART   NEGATIVE NUCLEAR STUDY/  GLOBAL LVSF/  EF 65%   CARPAL TUNNEL RELEASE Bilateral yrs ago   CATARACT EXTRACTION W/ INTRAOCULAR LENS  IMPLANT, BILATERAL  2013   CHOLECYSTECTOMY  1990   COLONOSCOPY N/A 07/26/2015   Procedure: COLONOSCOPY;  Surgeon: Malissa Hippo, MD;  Location: [facility];  Service: Endoscopy;  Laterality: N/A;  1200   CORONARY ANGIOPLASTY  11-13-1999   balloon angioplasty to mLAD , d2 of LAD   CORONARY ANGIOPLASTY WITH STENT PLACEMENT  01-17-2000   PCI stenting to mLAD & D2 of LAD   CORONARY ARTERY BYPASS GRAFT  07-02-2000   DR Tressie Stalker   SVG to diagonal, LIMA to LAD   CYSTOSCOPY W/ RETROGRADES Bilateral 08/09/2014   Procedure: CYSTOSCOPY, BILATERAL RETROGRADE, HYDRODISTENSION, BLADDER BIOPSY WITH FULGERATION, INSTILL PYRIDIUM AND MARCAINE ;  Surgeon: Anner Crete, MD;  Location: [facility];  Service: Urology;  Laterality: Bilateral;   KNEE ARTHROSCOPY Left 10-18-2003   SYNOVECTOMY   LUMBAR SPINE SURGERY  x2    yrs ago   ORIF HUMERUS FRACTURE Right 04-18-2008   REPAIR FLEXOR TENDON HAND Right 09-20-2008   CARPI RADIALIS TENDON TRANSFER TO EXTENSOR TO INDEX, MIDDLE, RING , LITTLE FINGERS AND DIGITI MININI   ROTATOR CUFF REPAIR Right 1995   TOTAL ABDOMINAL HYSTERECTOMY W/ BILATERAL SALPINGOOPHORECTOMY  age 5   TRANSTHORACIC ECHOCARDIOGRAM  12-12-2013   MILD LVH/  EF 60-65%/  GRADE I DIASTOLIC DYSFUNCTION/  MILD LAE    Current Outpatient Rx   Order #: [id number] Class: Normal   Order #: [id number] Class: Normal   Order #: [id number] Class: Normal   Order #: [id number] Class: Normal   Order #: [id number] Class: Normal   Order #: [id number] Class: Historical Med   Order #: [id number] Class:  Normal   Order #: [id number] Class: Normal   Order #: [id number] Class: Normal   Order #: [id number] Class: Normal   Order #: [id number] Class: Normal   Order #: [id number] Class: Normal   Order #: [id number] Class: Normal   Order #: [id number] Class: Normal   Order #: [id number] Class: Print   Order #: [id number] Class: Normal   Order #: [id number] Class: Normal   Order #: [id number] Class: Historical Med    Allergies Codeine  Family History  Problem Relation Age of Onset   Hypertension Mother    Stroke Mother    Coronary artery disease Other        Multiple first and second-degree relatives   Aneurysm Other        Cerebral circulation   Colon cancer Sister    Stroke Sister    Coronary artery disease Sister    Clotting disorder Other        Children diagnosed with hypercoagulable state   Coronary artery disease Brother     Social History Social History  Tobacco Use   Smoking status: Never Smoker   Smokeless tobacco: Never Used  Substance Use Topics   Alcohol use: No   Drug use: No    Review of Systems  All other systems negative except as documented in the HPI. All pertinent positives and negatives as reviewed in the HPI. ____________________________________________   PHYSICAL EXAM:  VITAL SIGNS: ED Triage Vitals  Enc Vitals Group     BP 03/30/19 2358 140/71     Pulse Rate 03/30/19 2358 76     Resp 03/30/19 2358 17     Temp 03/31/19 0000 98.2 F (36.8 C)     Temp Source 03/31/19 0000 Oral     SpO2 03/30/19 2358 96 %    Constitutional: Alert and oriented. Well appearing and in no acute distress. Eyes: Conjunctivae are normal. PERRL. EOMI. Head: Atraumatic. Nose: No congestion/rhinnorhea. Mouth/Throat: Mucous membranes are moist.  Oropharynx non-erythematous. Neck: No stridor.  No meningeal signs.   Cardiovascular: Normal rate, regular rhythm. Good peripheral circulation. Grossly normal heart sounds.   Respiratory: Normal respiratory effort.   No retractions. Lungs CTAB. Gastrointestinal: Soft and nontender. No distention.  Musculoskeletal: No lower extremity tenderness nor edema. No gross deformities of extremities. Neurologic:  Normal speech and language. No gross focal neurologic deficits are appreciated.  Skin:  Skin is warm, dry and intact. No rash noted. Pressure ulcer in gluteal cleft.   ____________________________________________   RADIOLOGY  Dg Pelvis 1-2 Views  Result Date: 03/31/2019 CLINICAL DATA:  Fall 2 days ago with left pelvis/hip and femur pain. Recent fall leading to iliopsoas tendon tear. EXAM: PELVIS - 1-2 VIEW COMPARISON:  Pelvis and hip radiographs 03/17/2019. Pelvic MRI 03/19/2019 FINDINGS: The cortical margins of the bony pelvis are intact. No fracture. Pubic symphysis and sacroiliac joints are congruent. Both femoral heads are well-seated in the respective acetabula. There are vascular calcifications. IMPRESSION: No evidence of pelvic fracture. Electronically Signed   By: Narda Rutherford M.D.   On: 03/31/2019 02:06   Ct Head Wo Contrast  Result Date: 03/31/2019 CLINICAL DATA:  Initial evaluation for acute trauma, recent fall. EXAM: CT HEAD WITHOUT CONTRAST TECHNIQUE: Contiguous axial images were obtained from the base of the skull through the vertex without intravenous contrast. COMPARISON:  Prior CT from 03/17/2019. FINDINGS: Brain: Generalized age-related cerebral atrophy with moderate chronic microvascular ischemic disease. No acute intracranial hemorrhage. No acute large vessel territory infarct. No mass lesion, midline shift or mass effect. No hydrocephalus. No extra-axial fluid collection. Vascular: No hyperdense vessel. Scattered vascular calcifications noted within the carotid siphons. Skull: Scalp soft tissues demonstrate no acute finding. Calvarium intact. Sinuses/Orbits: Globes and orbital soft tissues within normal limits. Mild layering opacity within the right sphenoid sinus. Paranasal sinuses are  otherwise clear. No mastoid effusion. Other: None. IMPRESSION: 1. No acute intracranial abnormality. 2. Generalized age-related cerebral atrophy with moderate chronic small vessel ischemic disease. 3. Mild right sphenoid sinusitis. Electronically Signed   By: Rise Mu M.D.   On: 03/31/2019 02:00   Dg Humerus Left  Result Date: 03/31/2019 CLINICAL DATA:  Initial evaluation for acute trauma, fall. EXAM: LEFT HUMERUS - 2+ VIEW COMPARISON:  None. FINDINGS: There is no evidence of fracture or other focal bone lesions. Mild degenerative changes noted about the shoulder and elbow. Soft tissues are unremarkable. IMPRESSION: No acute osseous abnormality about the left humerus. Electronically Signed   By: Rise Mu M.D.   On: 03/31/2019 02:06   Dg Femur Min 2 Views Left  Result Date: 03/31/2019  CLINICAL DATA:  Initial evaluation for acute trauma, fall. EXAM: LEFT FEMUR 2 VIEWS COMPARISON:  None. FINDINGS: No acute fracture or dislocation. Osseous mineralization normal. Mild osteoarthritic changes about the hip and knee. No soft tissue abnormality. Prominent atherosclerotic change noted. IMPRESSION: No acute osseous abnormality about the left femur. Electronically Signed   By: Rise MuBenjamin  McClintock M.D.   On: 03/31/2019 01:55    ____________________________________________  INITIAL IMPRESSION / ASSESSMENT AND PLAN / ED COURSE  Seems the patient's main complaint for being here is the headache.  With the fall and these other pains will get x-rays.  1 dose of pain medication while she is here is that could be related to the headache as well since she is been out of it.  Review of PDMP shows that she last had 120 10 mg oxycodone filled on 3/24 by her PCP. At most I will give her 6 pills as pre pack to take until she can see her doctor later today.   Pertinent labs & imaging results that were available during my care of the patient were reviewed by me and considered in my medical decision  making (see chart for details).   A medical screening exam was performed and I feel the patient has had an appropriate workup for their chief complaint at this time and likelihood of emergent condition existing is low. They have been counseled on decision, discharge, follow up and which symptoms necessitate immediate return to the emergency department. They or their family verbally stated understanding and agreement with plan and discharged in stable condition.   ____________________________________________  FINAL CLINICAL IMPRESSION(S) / ED DIAGNOSES  Final diagnoses:  Fall, initial encounter     MEDICATIONS GIVEN DURING THIS VISIT:  Medications  oxyCODONE-acetaminophen (PERCOCET/ROXICET) 5-325 MG per tablet 1 tablet (1 tablet Oral Given 03/31/19 0025)  ketorolac (TORADOL) injection 30 mg (30 mg Intramuscular Given 03/31/19 0026)     NEW OUTPATIENT MEDICATIONS STARTED DURING THIS VISIT:  Discharge Medication List as of 03/31/2019  2:34 AM    START taking these medications   Details  oxyCODONE-acetaminophen (PERCOCET/ROXICET) 5-325 MG tablet Take 1 tablet by mouth every 8 (eight) hours as needed for severe pain., Starting Wed 03/31/2019, Print        Note:  This note was prepared with assistance of Dragon voice recognition software. Occasional wrong-word or sound-a-like substitutions may have occurred due to the inherent limitations of voice recognition software.   Harles Evetts, Barbara CowerJason, MD 03/31/19 [phone]

## 2019-04-01 MED FILL — Oxycodone w/ Acetaminophen Tab 5-325 MG: ORAL | Qty: 6 | Status: AC

## 2019-04-04 ENCOUNTER — Emergency Department (HOSPITAL_COMMUNITY): Payer: Medicare HMO

## 2019-04-04 ENCOUNTER — Encounter (HOSPITAL_COMMUNITY): Payer: Self-pay | Admitting: Emergency Medicine

## 2019-04-04 ENCOUNTER — Inpatient Hospital Stay (HOSPITAL_COMMUNITY)
Admission: EM | Admit: 2019-04-04 | Discharge: 2019-04-10 | DRG: 682 | Disposition: A | Discharge status: Hospice | Payer: Medicare HMO | Attending: Internal Medicine | Admitting: Internal Medicine

## 2019-04-04 ENCOUNTER — Other Ambulatory Visit: Payer: Self-pay

## 2019-04-04 DIAGNOSIS — E039 Hypothyroidism, unspecified: Secondary | ICD-10-CM | POA: Diagnosis present

## 2019-04-04 DIAGNOSIS — I2510 Atherosclerotic heart disease of native coronary artery without angina pectoris: Secondary | ICD-10-CM | POA: Diagnosis present

## 2019-04-04 DIAGNOSIS — I5032 Chronic diastolic (congestive) heart failure: Secondary | ICD-10-CM | POA: Diagnosis present

## 2019-04-04 DIAGNOSIS — J449 Chronic obstructive pulmonary disease, unspecified: Secondary | ICD-10-CM | POA: Diagnosis present

## 2019-04-04 DIAGNOSIS — M549 Dorsalgia, unspecified: Secondary | ICD-10-CM | POA: Diagnosis present

## 2019-04-04 DIAGNOSIS — R402142 Coma scale, eyes open, spontaneous, at arrival to emergency department: Secondary | ICD-10-CM | POA: Diagnosis present

## 2019-04-04 DIAGNOSIS — I6621 Occlusion and stenosis of right posterior cerebral artery: Secondary | ICD-10-CM | POA: Diagnosis present

## 2019-04-04 DIAGNOSIS — G4733 Obstructive sleep apnea (adult) (pediatric): Secondary | ICD-10-CM | POA: Diagnosis present

## 2019-04-04 DIAGNOSIS — Z7189 Other specified counseling: Secondary | ICD-10-CM

## 2019-04-04 DIAGNOSIS — Z515 Encounter for palliative care: Secondary | ICD-10-CM

## 2019-04-04 DIAGNOSIS — M1990 Unspecified osteoarthritis, unspecified site: Secondary | ICD-10-CM | POA: Diagnosis present

## 2019-04-04 DIAGNOSIS — R402362 Coma scale, best motor response, obeys commands, at arrival to emergency department: Secondary | ICD-10-CM | POA: Diagnosis present

## 2019-04-04 DIAGNOSIS — Z66 Do not resuscitate: Secondary | ICD-10-CM | POA: Diagnosis present

## 2019-04-04 DIAGNOSIS — Z79891 Long term (current) use of opiate analgesic: Secondary | ICD-10-CM

## 2019-04-04 DIAGNOSIS — Z79899 Other long term (current) drug therapy: Secondary | ICD-10-CM

## 2019-04-04 DIAGNOSIS — Z951 Presence of aortocoronary bypass graft: Secondary | ICD-10-CM

## 2019-04-04 DIAGNOSIS — G629 Polyneuropathy, unspecified: Secondary | ICD-10-CM | POA: Diagnosis present

## 2019-04-04 DIAGNOSIS — G8929 Other chronic pain: Secondary | ICD-10-CM | POA: Diagnosis present

## 2019-04-04 DIAGNOSIS — Z961 Presence of intraocular lens: Secondary | ICD-10-CM | POA: Diagnosis present

## 2019-04-04 DIAGNOSIS — R4182 Altered mental status, unspecified: Secondary | ICD-10-CM

## 2019-04-04 DIAGNOSIS — G9341 Metabolic encephalopathy: Secondary | ICD-10-CM | POA: Diagnosis present

## 2019-04-04 DIAGNOSIS — N390 Urinary tract infection, site not specified: Secondary | ICD-10-CM | POA: Diagnosis not present

## 2019-04-04 DIAGNOSIS — K219 Gastro-esophageal reflux disease without esophagitis: Secondary | ICD-10-CM | POA: Diagnosis not present

## 2019-04-04 DIAGNOSIS — J9601 Acute respiratory failure with hypoxia: Secondary | ICD-10-CM | POA: Diagnosis present

## 2019-04-04 DIAGNOSIS — N179 Acute kidney failure, unspecified: Secondary | ICD-10-CM | POA: Diagnosis present

## 2019-04-04 DIAGNOSIS — N3000 Acute cystitis without hematuria: Secondary | ICD-10-CM

## 2019-04-04 DIAGNOSIS — E785 Hyperlipidemia, unspecified: Secondary | ICD-10-CM | POA: Diagnosis present

## 2019-04-04 DIAGNOSIS — I110 Hypertensive heart disease with heart failure: Secondary | ICD-10-CM | POA: Diagnosis present

## 2019-04-04 DIAGNOSIS — R402232 Coma scale, best verbal response, inappropriate words, at arrival to emergency department: Secondary | ICD-10-CM | POA: Diagnosis present

## 2019-04-04 DIAGNOSIS — M5136 Other intervertebral disc degeneration, lumbar region: Secondary | ICD-10-CM | POA: Diagnosis present

## 2019-04-04 DIAGNOSIS — E1165 Type 2 diabetes mellitus with hyperglycemia: Secondary | ICD-10-CM | POA: Diagnosis present

## 2019-04-04 DIAGNOSIS — Z794 Long term (current) use of insulin: Secondary | ICD-10-CM

## 2019-04-04 DIAGNOSIS — Z9841 Cataract extraction status, right eye: Secondary | ICD-10-CM

## 2019-04-04 DIAGNOSIS — Z9842 Cataract extraction status, left eye: Secondary | ICD-10-CM

## 2019-04-04 DIAGNOSIS — E11649 Type 2 diabetes mellitus with hypoglycemia without coma: Secondary | ICD-10-CM | POA: Diagnosis not present

## 2019-04-04 DIAGNOSIS — Z7982 Long term (current) use of aspirin: Secondary | ICD-10-CM

## 2019-04-04 DIAGNOSIS — Z8249 Family history of ischemic heart disease and other diseases of the circulatory system: Secondary | ICD-10-CM

## 2019-04-04 DIAGNOSIS — Z1159 Encounter for screening for other viral diseases: Secondary | ICD-10-CM

## 2019-04-04 DIAGNOSIS — R0902 Hypoxemia: Secondary | ICD-10-CM

## 2019-04-04 DIAGNOSIS — Z7902 Long term (current) use of antithrombotics/antiplatelets: Secondary | ICD-10-CM

## 2019-04-04 DIAGNOSIS — E1142 Type 2 diabetes mellitus with diabetic polyneuropathy: Secondary | ICD-10-CM | POA: Diagnosis present

## 2019-04-04 DIAGNOSIS — R6251 Failure to thrive (child): Secondary | ICD-10-CM | POA: Diagnosis not present

## 2019-04-04 DIAGNOSIS — L89152 Pressure ulcer of sacral region, stage 2: Secondary | ICD-10-CM | POA: Diagnosis present

## 2019-04-04 DIAGNOSIS — M25532 Pain in left wrist: Secondary | ICD-10-CM | POA: Diagnosis not present

## 2019-04-04 DIAGNOSIS — Z8673 Personal history of transient ischemic attack (TIA), and cerebral infarction without residual deficits: Secondary | ICD-10-CM

## 2019-04-04 DIAGNOSIS — IMO0002 Reserved for concepts with insufficient information to code with codable children: Secondary | ICD-10-CM | POA: Diagnosis present

## 2019-04-04 DIAGNOSIS — Z6830 Body mass index (BMI) 30.0-30.9, adult: Secondary | ICD-10-CM

## 2019-04-04 DIAGNOSIS — E782 Mixed hyperlipidemia: Secondary | ICD-10-CM | POA: Diagnosis not present

## 2019-04-04 DIAGNOSIS — Z955 Presence of coronary angioplasty implant and graft: Secondary | ICD-10-CM

## 2019-04-04 DIAGNOSIS — L8990 Pressure ulcer of unspecified site, unspecified stage: Secondary | ICD-10-CM

## 2019-04-04 DIAGNOSIS — Z885 Allergy status to narcotic agent status: Secondary | ICD-10-CM

## 2019-04-04 DIAGNOSIS — Z7401 Bed confinement status: Secondary | ICD-10-CM

## 2019-04-04 DIAGNOSIS — R627 Adult failure to thrive: Secondary | ICD-10-CM | POA: Diagnosis present

## 2019-04-04 DIAGNOSIS — E869 Volume depletion, unspecified: Secondary | ICD-10-CM | POA: Diagnosis present

## 2019-04-04 DIAGNOSIS — Z823 Family history of stroke: Secondary | ICD-10-CM

## 2019-04-04 DIAGNOSIS — Z87440 Personal history of urinary (tract) infections: Secondary | ICD-10-CM

## 2019-04-04 DIAGNOSIS — I252 Old myocardial infarction: Secondary | ICD-10-CM

## 2019-04-04 DIAGNOSIS — Z79890 Hormone replacement therapy (postmenopausal): Secondary | ICD-10-CM

## 2019-04-04 LAB — URINALYSIS, ROUTINE W REFLEX MICROSCOPIC
Bilirubin Urine: NEGATIVE
Glucose, UA: NEGATIVE mg/dL
Hgb urine dipstick: NEGATIVE
Ketones, ur: NEGATIVE mg/dL
Nitrite: NEGATIVE
Protein, ur: NEGATIVE mg/dL
Specific Gravity, Urine: 1.006 (ref 1.005–1.030)
pH: 5 (ref 5.0–8.0)

## 2019-04-04 LAB — CBC WITH DIFFERENTIAL/PLATELET
Abs Immature Granulocytes: 0.03 10*3/uL (ref 0.00–0.07)
Basophils Absolute: 0 10*3/uL (ref 0.0–0.1)
Basophils Relative: 0 %
Eosinophils Absolute: 0.3 10*3/uL (ref 0.0–0.5)
Eosinophils Relative: 3 %
HCT: 38.1 % (ref 36.0–46.0)
Hemoglobin: 11.3 g/dL — ABNORMAL LOW (ref 12.0–15.0)
Immature Granulocytes: 0 %
Lymphocytes Relative: 13 %
Lymphs Abs: 1.3 10*3/uL (ref 0.7–4.0)
MCH: 25.2 pg — ABNORMAL LOW (ref 26.0–34.0)
MCHC: 29.7 g/dL — ABNORMAL LOW (ref 30.0–36.0)
MCV: 84.9 fL (ref 80.0–100.0)
Monocytes Absolute: 1.3 10*3/uL — ABNORMAL HIGH (ref 0.1–1.0)
Monocytes Relative: 13 %
Neutro Abs: 7.3 10*3/uL (ref 1.7–7.7)
Neutrophils Relative %: 71 %
Platelets: 270 10*3/uL (ref 150–400)
RBC: 4.49 MIL/uL (ref 3.87–5.11)
RDW: 17 % — ABNORMAL HIGH (ref 11.5–15.5)
WBC: 10.2 10*3/uL (ref 4.0–10.5)
nRBC: 0 % (ref 0.0–0.2)

## 2019-04-04 LAB — PROCALCITONIN: Procalcitonin: 0.26 ng/mL

## 2019-04-04 LAB — PROTIME-INR
INR: 1.2 (ref 0.8–1.2)
Prothrombin Time: 15.1 seconds (ref 11.4–15.2)

## 2019-04-04 LAB — GLUCOSE, CAPILLARY
Glucose-Capillary: 133 mg/dL — ABNORMAL HIGH (ref 70–99)
Glucose-Capillary: 141 mg/dL — ABNORMAL HIGH (ref 70–99)

## 2019-04-04 LAB — COMPREHENSIVE METABOLIC PANEL
ALT: 13 U/L (ref 0–44)
AST: 19 U/L (ref 15–41)
Albumin: 3.5 g/dL (ref 3.5–5.0)
Alkaline Phosphatase: 53 U/L (ref 38–126)
Anion gap: 11 (ref 5–15)
BUN: 26 mg/dL — ABNORMAL HIGH (ref 8–23)
CO2: 25 mmol/L (ref 22–32)
Calcium: 9.1 mg/dL (ref 8.9–10.3)
Chloride: 103 mmol/L (ref 98–111)
Creatinine, Ser: 3.48 mg/dL — ABNORMAL HIGH (ref 0.44–1.00)
GFR calc Af Amer: 14 mL/min — ABNORMAL LOW (ref >60)
GFR calc non Af Amer: 12 mL/min — ABNORMAL LOW (ref >60)
Glucose, Bld: 146 mg/dL — ABNORMAL HIGH (ref 70–99)
Potassium: 4.8 mmol/L (ref 3.5–5.1)
Sodium: 139 mmol/L (ref 135–145)
Total Bilirubin: 1 mg/dL (ref 0.3–1.2)
Total Protein: 6.3 g/dL — ABNORMAL LOW (ref 6.5–8.1)

## 2019-04-04 LAB — LACTIC ACID, PLASMA
Lactic Acid, Venous: 1.8 mmol/L (ref 0.5–1.9)
Lactic Acid, Venous: 0.9 mmol/L (ref 0.5–1.9)

## 2019-04-04 LAB — TROPONIN I
Troponin I: <0.03 ng/mL (ref <0.03)
Troponin I: <0.03 ng/mL (ref <0.03)
Troponin I: <0.03 ng/mL (ref <0.03)

## 2019-04-04 LAB — BRAIN NATRIURETIC PEPTIDE: B Natriuretic Peptide: 265 pg/mL — ABNORMAL HIGH (ref 0.0–100.0)

## 2019-04-04 LAB — CK: Total CK: 98 U/L (ref 38–234)

## 2019-04-04 MED ORDER — ACETAMINOPHEN 650 MG RE SUPP: 650.0000 mg | Freq: Four times a day (QID) | RECTAL | Status: DC | PRN

## 2019-04-04 MED ORDER — INSULIN ASPART 100 UNIT/ML ~~LOC~~ SOLN: 0.0000 [IU] | Freq: Every day | SUBCUTANEOUS | Status: DC

## 2019-04-04 MED ORDER — ONDANSETRON HCL 4 MG PO TABS: 4.0000 mg | ORAL_TABLET | Freq: Four times a day (QID) | ORAL | Status: DC | PRN

## 2019-04-04 MED ORDER — INSULIN ASPART 100 UNIT/ML ~~LOC~~ SOLN
0.0000 [IU] | Freq: Three times a day (TID) | SUBCUTANEOUS | Status: DC
  Administered 2019-04-04 – 2019-04-05 (×3): 1 [IU] via SUBCUTANEOUS
  Administered 2019-04-05: 7 [IU] via SUBCUTANEOUS
  Administered 2019-04-06: 3 [IU] via SUBCUTANEOUS

## 2019-04-04 MED ORDER — ASPIRIN EC 81 MG PO TBEC
81.0000 mg | DELAYED_RELEASE_TABLET | Freq: Every day | ORAL | Status: DC
  Administered 2019-04-05 – 2019-04-06 (×2): 81 mg via ORAL
  Filled 2019-04-04 (×2): qty 1

## 2019-04-04 MED ORDER — PANTOPRAZOLE SODIUM 40 MG PO TBEC
40.0000 mg | DELAYED_RELEASE_TABLET | Freq: Every day | ORAL | Status: DC
  Administered 2019-04-06: 40 mg via ORAL
  Filled 2019-04-04 (×2): qty 1

## 2019-04-04 MED ORDER — CLOPIDOGREL BISULFATE 75 MG PO TABS
75.0000 mg | ORAL_TABLET | Freq: Every day | ORAL | Status: DC
  Administered 2019-04-04 – 2019-04-06 (×3): 75 mg via ORAL
  Filled 2019-04-04 (×3): qty 1

## 2019-04-04 MED ORDER — SODIUM CHLORIDE 0.9 % IV SOLN
INTRAVENOUS | Status: AC
  Administered 2019-04-04 – 2019-04-05 (×2): via INTRAVENOUS

## 2019-04-04 MED ORDER — SIMVASTATIN 20 MG PO TABS
20.0000 mg | ORAL_TABLET | Freq: Every day | ORAL | Status: DC
  Administered 2019-04-04 – 2019-04-05 (×2): 20 mg via ORAL
  Filled 2019-04-04 (×2): qty 1

## 2019-04-04 MED ORDER — IPRATROPIUM-ALBUTEROL 0.5-2.5 (3) MG/3ML IN SOLN: 3.0000 mL | Freq: Four times a day (QID) | RESPIRATORY_TRACT | Status: DC | PRN

## 2019-04-04 MED ORDER — SODIUM CHLORIDE 0.9 % IV BOLUS (SEPSIS)
1000.0000 mL | Freq: Once | INTRAVENOUS | Status: AC
  Administered 2019-04-04: 1000 mL via INTRAVENOUS

## 2019-04-04 MED ORDER — SODIUM CHLORIDE 0.9 % IV SOLN
1000.0000 mL | INTRAVENOUS | Status: DC
  Administered 2019-04-04 – 2019-04-06 (×3): 1000 mL via INTRAVENOUS

## 2019-04-04 MED ORDER — SODIUM CHLORIDE 0.9 % IV SOLN
1.0000 g | Freq: Once | INTRAVENOUS | Status: AC
  Administered 2019-04-04: 1 g via INTRAVENOUS
  Filled 2019-04-04: qty 10

## 2019-04-04 MED ORDER — HEPARIN SODIUM (PORCINE) 5000 UNIT/ML IJ SOLN
5000.0000 [IU] | Freq: Three times a day (TID) | INTRAMUSCULAR | Status: DC
  Administered 2019-04-04 – 2019-04-06 (×5): 5000 [IU] via SUBCUTANEOUS
  Filled 2019-04-04 (×5): qty 1

## 2019-04-04 MED ORDER — OXYCODONE HCL 5 MG PO TABS
5.0000 mg | ORAL_TABLET | Freq: Four times a day (QID) | ORAL | Status: DC | PRN
  Administered 2019-04-04: 5 mg via ORAL
  Filled 2019-04-04: qty 1

## 2019-04-04 MED ORDER — ONDANSETRON HCL 4 MG/2ML IJ SOLN
4.0000 mg | Freq: Four times a day (QID) | INTRAMUSCULAR | Status: DC | PRN
  Administered 2019-04-04: 4 mg via INTRAVENOUS
  Filled 2019-04-04: qty 2

## 2019-04-04 MED ORDER — CITALOPRAM HYDROBROMIDE 20 MG PO TABS
20.0000 mg | ORAL_TABLET | Freq: Every day | ORAL | Status: DC
  Administered 2019-04-04 – 2019-04-10 (×7): 20 mg via ORAL
  Filled 2019-04-04 (×7): qty 1

## 2019-04-04 MED ORDER — LEVOTHYROXINE SODIUM 25 MCG PO TABS
25.0000 ug | ORAL_TABLET | Freq: Every day | ORAL | Status: DC
  Administered 2019-04-06: 25 ug via ORAL
  Filled 2019-04-04 (×2): qty 1

## 2019-04-04 MED ORDER — ACETAMINOPHEN 325 MG PO TABS
650.0000 mg | ORAL_TABLET | Freq: Four times a day (QID) | ORAL | Status: DC | PRN
  Administered 2019-04-07 – 2019-04-10 (×2): 650 mg via ORAL
  Filled 2019-04-04 (×3): qty 2

## 2019-04-04 NOTE — H&P (Signed)
History and Physical  [name] [name] WUJ:[id number] DOB: 03/10/1936 DOA: 04/04/2019   PCP: Oval Linsey, MD   Patient coming from: Home  Chief Complaint: altered mental status  HPI:  [name] [name] is a 83 y.o. female with medical history of diabetes mellitus type 2, chronic diastolic CHF, stroke, COPD, hypothyroidism, hypertension, coronary disease, hyperlipidemia, OSA presenting with altered mental status.  The patient has had numerous hospitalizations with similar presentation.  Most recently, the patient was discharged from the hospital after a stay from 03/17/2019 through 03/20/2019 for acute metabolic encephalopathy that was multifactorial including UTI and hypercarbia.  In addition, the patient had another admission from 02/22/2019 through 02/26/2019 for encephalopathy secondary to hypoglycemia and UTI.  Since her discharge from the hospital on 03/20/2019, the patient was discharged home to live with her daughter.  The patient had refused to go to a skilled nursing facility at that time.  Initially, the patient was able to stand and take a few steps with a walker.  However over the past week, the patient's family has noted increasing generalized weakness, decreased oral intake, and more somnolence.  On 03/29/2019, the patient was complaining of some dysuria.  Her home health nurse called the patient's PCP.  The patient was prescribed ciprofloxacin.  Since that date, the patient has had increasing somnolence which was significantly worse on 04/03/2019.  During this past week, the patient has had significantly decreased oral intake.  According to the patient's family, the patient was somnolent and slept most of the day.  When she woke up on 04/04/2019, the patient complained of chest discomfort.  As result, EMS was activated.  During this past week, the patient has not had any recurrent discomfort or complained of any chronic worsening cough or shortness of breath.  The patient did have a low-grade  temperature of 100.2 F on 04/03/2019.  There is been no complaints of headaches, nausea, vomiting, diarrhea, abdominal pain.  According to the patient's daughter, they have been compliant with social distancing in the context of the coronavirus pandemic.  There is been no sick contacts.  Other than the ciprofloxacin, there has not been any new medications. In the emergency department, the patient had a low-grade temperature of 99.7 F.  She was hemodynamically stable.  Oxygen saturations were 92--95% on room air.  BMP showed a serum creatinine of 3.48 which was significantly above her usual baseline.  WBC was 10.2 with hemoglobin 11.3 and platelets 270,000.  Chest x-ray showed poor inspiration without consolidation or edema.  Renal ultrasound was negative for hydronephrosis but showed right renal atrophy.  CT of the brain was negative for acute findings.  Lactic acid was 1.8.  Urinalysis showed 21-50 WBC.  The patient was given a liter bolus of normal saline and started on ceftriaxone.  Assessment/Plan: Acute metabolic encephalopathy -Secondary to acute kidney injury and UTI as well as ciprofloxacin -At baseline, the patient has poor functional status requiring assistance with all aspects of activities of daily living -She has been essentially bedbound for the past week -At baseline, the patient is alert and oriented x3.  Acute kidney injury -Secondary to volume depletion -Start IV fluids -Renal ultrasound negative for hydronephrosis -Discontinue lisinopril  Pyuria -Concerning for UTI -Continue ceftriaxone pending culture data  Essential hypertension -Holding lisinopril in the setting of acute kidney injury -Holding amlodipine secondary to soft blood pressure  Diabetes mellitus type 2, uncontrolled with hyperglycemia -As the patient has decreased oral intake, hold 70/30 insulin -  NovoLog sliding scale -02/18/2019 hemoglobin A1c 8.5  Failure to thrive in adult -The patient's daughter  relates that functional and cognitive decline over the past 4 to 5 months -Goals of care discussed--patient is DNR -PT evaluation -Serum B12 -Folic acid -03/18/2019 TSH 0.558  Hyperlipidemia -Continue statin  Hypothyroidism -Continue levothyroxine  History of multiple strokes -Continue aspirin and Plavix secondary to severe right PCA stenosis -This was recommended by neurology during consultation in February 2020  Goals of Care -Advance care planning, including the explanation and discussion of advance directives was carried out with the patient and family.  Code status including explanations of "Full Code" and "DNR" and alternatives were discussed in detail.  Discussion of end-of-life issues including but not limited palliative care, hospice care and the concept of hospice, other end-of-life care options, power of attorney for health care decisions, living wills, and physician orders for life-sustaining treatment were also discussed with the patient and family.  Total face to face time 16 minutes. -DNR     Past Medical History:  Diagnosis Date   Arthritis    CAD (coronary artery disease)    Multivessel status post CABG 2001   Chronic obstructive pulmonary disease (HCC)    Chronic pain    DDD (degenerative disc disease), lumbar    Diastolic CHF (HCC)    Essential hypertension    Hematuria    History of CVA (cerebrovascular accident)    2013 - lacunar infarct right thalamus - residual left side of  lip numb  and left eye vision worse (which corrented lens implant from cataract extraction)   History of MI (myocardial infarction)    July 2001   Hyperlipidemia    Hypothyroidism    Insulin dependent type 2 diabetes mellitus (HCC)    Lesion of bladder    Lower urinary tract symptoms (LUTS)    OSA (obstructive sleep apnea)    Peripheral neuropathy    Psoriasis    SUI (stress urinary incontinence, female)    Varicose veins    Past Surgical History:    Procedure Laterality Date   APPENDECTOMY  age 66   CARDIAC CATHETERIZATION  1991   No sig. cad   CARDIAC CATHETERIZATION  07-01-2000   high grade in-stent restenosis   CARDIAC CATHETERIZATION  04-30-2001   single native vessel cad/ graft patent   CARDIAC CATHETERIZATION  02-13-2006  dr [place] cad with total occluded LAD  and  70% ostial of small ramus/  grafts patent/  normal ef   CARDIAC CATHETERIZATION  01-20-2000  dr Clifton James   essentially unchanged;  severe single vessel cad with diffuse disease throughout CFX and RCA/ ef 66%;  chf with preserved LVSF   CARDIOVASCULAR STRESS TEST  08-28-2011   DR ROTHBART   NEGATIVE NUCLEAR STUDY/  GLOBAL LVSF/  EF 65%   CARPAL TUNNEL RELEASE Bilateral yrs ago   CATARACT EXTRACTION W/ INTRAOCULAR LENS  IMPLANT, BILATERAL  2013   CHOLECYSTECTOMY  1990   COLONOSCOPY N/A 07/26/2015   Procedure: COLONOSCOPY;  Surgeon: Malissa Hippo, MD;  Location: [facility];  Service: Endoscopy;  Laterality: N/A;  1200   CORONARY ANGIOPLASTY  11-13-1999   balloon angioplasty to mLAD , d2 of LAD   CORONARY ANGIOPLASTY WITH STENT PLACEMENT  01-17-2000   PCI stenting to mLAD & D2 of LAD   CORONARY ARTERY BYPASS GRAFT  07-02-2000   DR Tressie Stalker   SVG to diagonal, LIMA to LAD   CYSTOSCOPY W/ RETROGRADES Bilateral 08/09/2014  Procedure: CYSTOSCOPY, BILATERAL RETROGRADE, HYDRODISTENSION, BLADDER BIOPSY WITH FULGERATION, INSTILL PYRIDIUM AND MARCAINE ;  Surgeon: Anner CreteJohn J Wrenn, MD;  [facility];  Service: Urology;  Laterality: Bilateral;   KNEE ARTHROSCOPY Left 10-18-2003   SYNOVECTOMY   LUMBAR SPINE SURGERY  x2    yrs ago   ORIF HUMERUS FRACTURE Right 04-18-2008   REPAIR FLEXOR TENDON HAND Right 09-20-2008   CARPI RADIALIS TENDON TRANSFER TO EXTENSOR TO INDEX, MIDDLE, RING , LITTLE FINGERS AND DIGITI MININI   ROTATOR CUFF REPAIR Right 1995   TOTAL ABDOMINAL HYSTERECTOMY W/ BILATERAL SALPINGOOPHORECTOMY  age 83    TRANSTHORACIC ECHOCARDIOGRAM  12-12-2013   MILD LVH/  EF 60-65%/  GRADE I DIASTOLIC DYSFUNCTION/  MILD LAE   Social History:  reports that she has never smoked. She has never used smokeless tobacco. She reports that she does not drink alcohol or use drugs.   Family History  Problem Relation Age of Onset   Hypertension Mother    Stroke Mother    Coronary artery disease Other        Multiple first and second-degree relatives   Aneurysm Other        Cerebral circulation   Colon cancer Sister    Stroke Sister    Coronary artery disease Sister    Clotting disorder Other        Children diagnosed with hypercoagulable state   Coronary artery disease Brother      Allergies  Allergen Reactions   Codeine Itching     Prior to Admission medications   Medication Sig Start Date End Date Taking? Authorizing Provider  acetaminophen (TYLENOL) 325 MG tablet Take 2 tablets (650 mg total) by mouth every 6 (six) hours as needed for mild pain or headache (or Fever >/= 101). 02/26/19  Yes Emokpae, Courage, MD  aspirin EC 81 MG tablet Take 1 tablet (81 mg total) by mouth daily. 11/03/18  Yes Rhetta MuraSamtani, Jai-Gurmukh, MD  ciprofloxacin (CIPRO) 500 MG tablet Take 500 mg by mouth 2 (two) times daily.  03/31/19  Yes [provider]  citalopram (CELEXA) 20 MG tablet Take 1 tablet (20 mg total) by mouth daily. 03/11/19  Yes Sharee HolsterGreen, Deborah S, NP  clopidogrel (PLAVIX) 75 MG tablet Take 1 tablet (75 mg total) by mouth daily for 30 days. 03/11/19 04/10/19 Yes Sharee HolsterGreen, Deborah S, NP  Insulin Isophane & Regular Human (HUMULIN 70/30 KWIKPEN) (70-30) 100 UNIT/ML PEN Inject 25 Units into the skin 2 (two) times daily. 03/11/19  Yes Sharee HolsterGreen, Deborah S, NP  levothyroxine (SYNTHROID, LEVOTHROID) 25 MCG tablet Take 1 tablet (25 mcg total) by mouth daily. 03/11/19  Yes Sharee HolsterGreen, Deborah S, NP  lisinopril (PRINIVIL,ZESTRIL) 10 MG tablet Take 1 tablet (10 mg total) by mouth daily. 03/11/19  Yes Sharee HolsterGreen, Deborah S, NP    nitroGLYCERIN (NITROSTAT) 0.4 MG SL tablet Place 1 tablet (0.4 mg total) under the tongue every 5 (five) minutes as needed for chest pain. 03/11/19  Yes Sharee HolsterGreen, Deborah S, NP  oxyCODONE (OXY IR/ROXICODONE) 5 MG immediate release tablet Take 1 tablet (5 mg total) by mouth every 6 (six) hours as needed for severe pain. Patient taking differently: Take 10 mg by mouth every 6 (six) hours as needed for severe pain.  03/11/19  Yes Sharee HolsterGreen, Deborah S, NP  pantoprazole (PROTONIX) 40 MG tablet Take 1 tablet (40 mg total) by mouth daily at 6 (six) AM. 03/11/19  Yes Sharee HolsterGreen, Deborah S, NP  simvastatin (ZOCOR) 20 MG tablet Take 1 tablet (20 mg  total) by mouth daily. 03/11/19  Yes Sharee Holster, NP  amLODipine (NORVASC) 5 MG tablet Take 5 mg by mouth every other day.    [provider]  benzonatate (TESSALON) 200 MG capsule Take 1 capsule (200 mg total) by mouth 3 (three) times daily as needed for cough. Patient not taking: Reported on 04/04/2019 03/11/19   Sharee Holster, NP  docusate sodium (COLACE) 100 MG capsule Take 100 mg by mouth daily. 03/09/19   [provider]  ferrous sulfate 325 (65 FE) MG tablet Take 1 tablet (325 mg total) by mouth daily with breakfast. Patient not taking: Reported on 04/04/2019 03/11/19   Sharee Holster, NP  gabapentin (NEURONTIN) 300 MG capsule Take 1 capsule (300 mg total) by mouth 2 (two) times daily. Patient not taking: Reported on 04/04/2019 03/11/19   Sharee Holster, NP  ipratropium-albuterol (DUONEB) 0.5-2.5 (3) MG/3ML SOLN Inhale 3 mLs into the lungs 4 (four) times daily as needed (for shortness of breath). 03/11/19   Sharee Holster, NP  oxyCODONE-acetaminophen (PERCOCET/ROXICET) 5-325 MG tablet Take 1 tablet by mouth every 8 (eight) hours as needed for severe pain. Patient not taking: Reported on 04/04/2019 03/31/19   Mesner, Barbara Cower, MD  UNABLE TO FIND CPAP from home and previous home settings while sleeping    [provider]    Review of Systems:   Unobtainable due to acute encephalopathy  Physical Exam: Vitals:   04/04/19 0930 04/04/19 1100 04/04/19 1130 04/04/19 1200  BP: (!) 111/39 (!) 108/55 (!) 118/56 (!) 105/51  Pulse: 87 89 84 85  Resp: (!) 21 19 19 16   Temp:      TempSrc:      SpO2: 91% 92% (!) 89% 92%  Weight:      Height:       General:  A&O x 1, NAD, nontoxic, pleasant/cooperative Head/Eye: No conjunctival hemorrhage, no icterus, [place]/AT, No nystagmus ENT:  No icterus,  No thrush, good dentition, no pharyngeal exudate Neck:  No masses, no lymphadenpathy, no bruits CV:  RRR, no rub, no gallop, no S3 Lung:  Bibasilar crackles, no wheeze Abdomen: soft/NT, +BS, nondistended, no peritoneal signs Ext: No cyanosis, No rashes, No petechiae, No lymphangitis, No edema Neuro: CNII-XII intact, strength 4/5 RUE and RLE; 4- LUE and LLE no dysmetria  Labs on Admission:  Basic Metabolic Panel: Recent Labs  Lab 04/04/19 0942  NA 139  K 4.8  CL 103  CO2 25  GLUCOSE 146*  BUN 26*  CREATININE 3.48*  CALCIUM 9.1   Liver Function Tests: Recent Labs  Lab 04/04/19 0942  AST 19  ALT 13  ALKPHOS 53  BILITOT 1.0  PROT 6.3*  ALBUMIN 3.5   No results for input(s): LIPASE, AMYLASE in the last 168 hours. No results for input(s): AMMONIA in the last 168 hours. CBC: Recent Labs  Lab 04/04/19 0942  WBC 10.2  NEUTROABS 7.3  HGB 11.3*  HCT 38.1  MCV 84.9  PLT 270   Coagulation Profile: Recent Labs  Lab 04/04/19 0942  INR 1.2   Cardiac Enzymes: Recent Labs  Lab 04/04/19 0944  TROPONINI <0.03   BNP: Invalid input(s): POCBNP CBG: No results for input(s): GLUCAP in the last 168 hours. Urine analysis:    Component Value Date/Time   COLORURINE YELLOW 04/04/2019 0932   APPEARANCEUR HAZY (A) 04/04/2019 0932   LABSPEC 1.006 04/04/2019 0932   PHURINE 5.0 04/04/2019 0932   GLUCOSEU NEGATIVE 04/04/2019 0932   HGBUR NEGATIVE 04/04/2019 0932  BILIRUBINUR NEGATIVE 04/04/2019 0932   KETONESUR NEGATIVE 04/04/2019  0932   PROTEINUR NEGATIVE 04/04/2019 0932   UROBILINOGEN 0.2 08/29/2015 1842   NITRITE NEGATIVE 04/04/2019 0932   LEUKOCYTESUR MODERATE (A) 04/04/2019 0932   Sepsis Labs: (procalcitonin:4,lacticidven:4) )No results found for this or any previous visit (from the past 240 hour(s)).   Radiological Exams on Admission: Dg Chest 2 View  Result Date: 04/04/2019 CLINICAL DATA:  83 year old with acute mental status changes that began yesterday. Patient is currently undergoing treatment for a urinary tract infection. Acute chest pain that began earlier today. EXAM: CHEST - 2 VIEW COMPARISON:  CT chest and chest x-ray 03/17/2019 and earlier. FINDINGS: AP ERECT and LATERAL images were obtained. Markedly suboptimal inspiration. Prior sternotomy for CABG. Cardiac silhouette mildly enlarged, unchanged. Pulmonary vascularity normal without evidence of pulmonary edema. Linear scarring scattered throughout both lungs, unchanged. Lungs otherwise clear. No visible pleural effusions. Prior ORIF of a LEFT humerus fracture. IMPRESSION: Markedly suboptimal inspiration. Stable mild cardiomegaly. No acute cardiopulmonary disease. Electronically Signed   By: Hulan Saas M.D.   On: 04/04/2019 11:11   Ct Head Wo Contrast  Result Date: 04/04/2019 CLINICAL DATA:  Altered level of consciousness. EXAM: CT HEAD WITHOUT CONTRAST TECHNIQUE: Contiguous axial images were obtained from the base of the skull through the vertex without intravenous contrast. COMPARISON:  CT head 03/31/2019 FINDINGS: Brain: Atrophy and chronic microvascular ischemic changes are stable. No new findings. No acute infarct, hemorrhage, or mass. Vascular: Negative for hyperdense vessel Skull: Negative Sinuses/Orbits: Small air-fluid level posterior ethmoid sinus on the right. Remaining sinuses clear. Bilateral cataract surgery. Other: None IMPRESSION: No acute abnormality and no change from the recent study. Electronically Signed   By: Marlan Palau M.D.   On: 04/04/2019 10:57   US Renal  Result Date: 04/04/2019 CLINICAL DATA:  Acute kidney injury EXAM: RENAL / URINARY TRACT ULTRASOUND COMPLETE COMPARISON:  05/19/2017 CT abdomen/pelvis. FINDINGS: Right Kidney: Renal measurements: 10.0 x 4.9 x 5.0 cm = volume: 129 mL. No hydronephrosis. Mildly echogenic and mildly atrophic right renal parenchyma. No right renal mass. Left Kidney: Renal measurements: 12.7 x 6.1 x 6.2 cm = volume: 251 mL. No hydronephrosis. Mildly echogenic renal parenchyma, normal in thickness. Simple appearing 2.2 x 1.9 x 1.9 cm interpolar left renal cyst. Bladder: Appears normal for degree of bladder distention. Ureteral jets are not visualized in the bladder on color Doppler. IMPRESSION: 1. No hydronephrosis. 2. Asymmetric right renal atrophy. 3. Echogenic kidneys, indicative of nonspecific renal parenchymal disease of uncertain chronicity. 4. Simple appearing left renal cyst. 5. Normal bladder. Electronically Signed   By: Delbert Phenix M.D.   On: 04/04/2019 12:20    EKG: Independently reviewed. Sinus, IVCD, nonspecific ST changes    Time spent:60 minutes Code Status:   DNR Family Communication:  Daughter updated on phone Disposition Plan: expect 2-3 day hospitalization Consults called: none DVT Prophylaxis: [place] Heparin    Catarina Hartshorn, DO  [facility] Pager [phone]  If 7PM-7AM, please contact night-coverage [email or web address] Password [facility] 04/04/2019, 1:43 PM

## 2019-04-04 NOTE — Progress Notes (Signed)
Patient's oxygen saturation was in the high 70s-low 80s. Patient was asymptomatic-  Not complaining of shortness of breath or light headedness and resting comfortably. Put patient on 2 L of oxygen via nasal cannula. Patient's oxygen saturation now in the low 90s.

## 2019-04-04 NOTE — ED Triage Notes (Signed)
Pt here for evaluation of altered mental status since yesterday.  Had a UTI 3 weeks ago and completed an antibiotic, was started on a new one yesterday.  Pt was complaining of chest pain to her family and was given Aspirin 325mg  and Nitro x 3.  No complaints of chest pain since ems picked pt up.

## 2019-04-04 NOTE — ED Provider Notes (Signed)
[facility] [place] EMERGENCY DEPARTMENT Provider Note   CSN: [id number] Arrival date & time: 04/04/19  0920  Level 5 caveat: Confusion, altered mental status  History   Chief Complaint Chief Complaint  Patient presents with  . Altered Mental Status    HPI [name] [name] is a 83 y.o. female.     HPI According to the EMS report patient was brought to the emergency room for evaluation of confusion, altered mental status since yesterday.  She has been diagnosed with a UTI and finished a course of antibiotics and was started a new one yesterday.  This morning the patient began complaining of pain in her chest.  She was given aspirin and nitroglycerin by her family.  EMS was called and by the time EMS arrived her chest pain resolved.  Patient is alert and speaking but is confused and is unable to provide a coherent history as to why she is here. Past Medical History:  Diagnosis Date  . Arthritis   . CAD (coronary artery disease)    Multivessel status post CABG 2001  . Chronic obstructive pulmonary disease (HCC)   . Chronic pain   . DDD (degenerative disc disease), lumbar   . Diastolic CHF (HCC)   . Essential hypertension   . Hematuria   . History of CVA (cerebrovascular accident)    2013 - lacunar infarct right thalamus - residual left side of  lip numb  and left eye vision worse (which corrented lens implant from cataract extraction)  . History of MI (myocardial infarction)    July 2001  . Hyperlipidemia   . Hypothyroidism   . Insulin dependent type 2 diabetes mellitus (HCC)   . Lesion of bladder   . Lower urinary tract symptoms (LUTS)   . OSA (obstructive sleep apnea)   . Peripheral neuropathy   . Psoriasis   . SUI (stress urinary incontinence, female)   . Varicose veins     Patient Active Problem List   Diagnosis Date Noted  . Acute metabolic encephalopathy 03/17/2019  . Chronic diastolic CHF (congestive heart failure) (HCC) 03/17/2019  . Iron deficiency anemia 03/12/2019   . Protein-calorie malnutrition, moderate (HCC) 03/12/2019  . Hypertension associated with type 2 diabetes mellitus (HCC) 03/09/2019  . GERD without esophagitis 03/09/2019  . Dyslipidemia associated with type 2 diabetes mellitus (HCC) 03/09/2019  . Uncontrolled type 2 diabetes mellitus with peripheral neuropathy (HCC) 03/09/2019  . Major depression, recurrent, chronic (HCC) 03/09/2019  . Hypoglycemia 02/22/2019  . Generalized weakness 02/12/2019  . Fall at home 02/12/2019  . Symptomatic Yeast UTI 02/12/2019  . Leukocytosis 02/12/2019  . AKI (acute kidney injury) (HCC) 02/12/2019  . COPD with acute exacerbation (HCC) 12/13/2018  . Syncope   . Loss of consciousness (HCC) 06/10/2018  . Bradycardia, sinus 06/10/2018  . Diastolic dysfunction 09/29/2017  . CAD (coronary artery disease) 09/28/2017  . Acute lower UTI 09/28/2017  . Ischemic stroke (HCC) 05/10/2017  . Acute ischemic stroke (HCC) 05/10/2017  . Dysarthria 03/18/2016  . Anemia of chronic disease 11/19/2015  . OSA (obstructive sleep apnea) 11/19/2015  . Left facial numbness 08/29/2015  . Gait instability 06/26/2015  . COPD exacerbation (HCC) 11/13/2014  . Weakness generalized 12/10/2013  . TIA (transient ischemic attack) 06/22/2012  . Left-sided headache 06/22/2012  . Noncompliance 06/22/2012  . Noncompliance with CPAP treatment 06/22/2012  . Psoriasis 01/07/2012  . Chronic back pain   . Chronic obstructive pulmonary disease (HCC)   . DM type 2 causing complication (HCC) 02/14/2010  .  Obesity 02/14/2010  . Arteriosclerotic cardiovascular disease (ASCVD) 02/14/2010  . Hypothyroidism 02/06/2010  . Hyperlipidemia 02/06/2010  . Essential hypertension 02/06/2010  . Sleep apnea 02/06/2010    Past Surgical History:  Procedure Laterality Date  . APPENDECTOMY  age 23  . CARDIAC CATHETERIZATION  1991   No sig. cad  . CARDIAC CATHETERIZATION  07-01-2000   high grade in-stent restenosis  . CARDIAC CATHETERIZATION  04-30-2001    single native vessel cad/ graft patent  . CARDIAC CATHETERIZATION  02-13-2006  dr [place] cad with total occluded LAD  and  70% ostial of small ramus/  grafts patent/  normal ef  . CARDIAC CATHETERIZATION  01-20-2000  dr Clifton James   essentially unchanged;  severe single vessel cad with diffuse disease throughout CFX and RCA/ ef 66%;  chf with preserved LVSF  . CARDIOVASCULAR STRESS TEST  08-28-2011   DR ROTHBART   NEGATIVE NUCLEAR STUDY/  GLOBAL LVSF/  EF 65%  . CARPAL TUNNEL RELEASE Bilateral yrs ago  . CATARACT EXTRACTION W/ INTRAOCULAR LENS  IMPLANT, BILATERAL  2013  . CHOLECYSTECTOMY  1990  . COLONOSCOPY N/A 07/26/2015   Procedure: COLONOSCOPY;  Surgeon: Malissa Hippo, MD;  Location: [facility];  Service: Endoscopy;  Laterality: N/A;  1200  . CORONARY ANGIOPLASTY  11-13-1999   balloon angioplasty to mLAD , d2 of LAD  . CORONARY ANGIOPLASTY WITH STENT PLACEMENT  01-17-2000   PCI stenting to mLAD & D2 of LAD  . CORONARY ARTERY BYPASS GRAFT  07-02-2000   DR Tressie Stalker   SVG to diagonal, LIMA to LAD  . CYSTOSCOPY W/ RETROGRADES Bilateral 08/09/2014   Procedure: CYSTOSCOPY, BILATERAL RETROGRADE, HYDRODISTENSION, BLADDER BIOPSY WITH FULGERATION, INSTILL PYRIDIUM AND MARCAINE ;  Surgeon: Anner Crete, MD;  Location: [facility];  Service: Urology;  Laterality: Bilateral;  . KNEE ARTHROSCOPY Left 10-18-2003   SYNOVECTOMY  . LUMBAR SPINE SURGERY  x2    yrs ago  . ORIF HUMERUS FRACTURE Right 04-18-2008  . REPAIR FLEXOR TENDON HAND Right 09-20-2008   CARPI RADIALIS TENDON TRANSFER TO EXTENSOR TO INDEX, MIDDLE, RING , LITTLE FINGERS AND DIGITI MININI  . ROTATOR CUFF REPAIR Right 1995  . TOTAL ABDOMINAL HYSTERECTOMY W/ BILATERAL SALPINGOOPHORECTOMY  age 12  . TRANSTHORACIC ECHOCARDIOGRAM  12-12-2013   MILD LVH/  EF 60-65%/  GRADE I DIASTOLIC DYSFUNCTION/  MILD LAE     OB History    Gravida  7   Para  7   Term  7   Preterm      AB      Living  4     SAB       TAB      Ectopic      Multiple      Live Births               Home Medications    Prior to Admission medications   Medication Sig Start Date End Date Taking? Authorizing Provider  acetaminophen (TYLENOL) 325 MG tablet Take 2 tablets (650 mg total) by mouth every 6 (six) hours as needed for mild pain or headache (or Fever >/= 101). 02/26/19  Yes Emokpae, Courage, MD  aspirin EC 81 MG tablet Take 1 tablet (81 mg total) by mouth daily. 11/03/18  Yes Rhetta Mura, MD  ciprofloxacin (CIPRO) 500 MG tablet Take 500 mg by mouth 2 (two) times daily.  03/31/19  Yes [provider]  citalopram (CELEXA) 20 MG tablet Take 1 tablet (  20 mg total) by mouth daily. 03/11/19  Yes Sharee HolsterGreen, Deborah S, NP  clopidogrel (PLAVIX) 75 MG tablet Take 1 tablet (75 mg total) by mouth daily for 30 days. 03/11/19 04/10/19 Yes Sharee HolsterGreen, Deborah S, NP  Insulin Isophane & Regular Human (HUMULIN 70/30 KWIKPEN) (70-30) 100 UNIT/ML PEN Inject 25 Units into the skin 2 (two) times daily. 03/11/19  Yes Sharee HolsterGreen, Deborah S, NP  levothyroxine (SYNTHROID, LEVOTHROID) 25 MCG tablet Take 1 tablet (25 mcg total) by mouth daily. 03/11/19  Yes Sharee HolsterGreen, Deborah S, NP  lisinopril (PRINIVIL,ZESTRIL) 10 MG tablet Take 1 tablet (10 mg total) by mouth daily. 03/11/19  Yes Sharee HolsterGreen, Deborah S, NP  nitroGLYCERIN (NITROSTAT) 0.4 MG SL tablet Place 1 tablet (0.4 mg total) under the tongue every 5 (five) minutes as needed for chest pain. 03/11/19  Yes Sharee HolsterGreen, Deborah S, NP  oxyCODONE (OXY IR/ROXICODONE) 5 MG immediate release tablet Take 1 tablet (5 mg total) by mouth every 6 (six) hours as needed for severe pain. Patient taking differently: Take 10 mg by mouth every 6 (six) hours as needed for severe pain.  03/11/19  Yes Sharee HolsterGreen, Deborah S, NP  pantoprazole (PROTONIX) 40 MG tablet Take 1 tablet (40 mg total) by mouth daily at 6 (six) AM. 03/11/19  Yes Sharee HolsterGreen, Deborah S, NP  simvastatin (ZOCOR) 20 MG tablet Take 1 tablet (20 mg total) by mouth daily.  03/11/19  Yes Sharee HolsterGreen, Deborah S, NP  amLODipine (NORVASC) 5 MG tablet Take 5 mg by mouth every other day.    [provider]  benzonatate (TESSALON) 200 MG capsule Take 1 capsule (200 mg total) by mouth 3 (three) times daily as needed for cough. Patient not taking: Reported on 04/04/2019 03/11/19   Sharee HolsterGreen, Deborah S, NP  docusate sodium (COLACE) 100 MG capsule Take 100 mg by mouth daily. 03/09/19   [provider]  ferrous sulfate 325 (65 FE) MG tablet Take 1 tablet (325 mg total) by mouth daily with breakfast. Patient not taking: Reported on 04/04/2019 03/11/19   Sharee HolsterGreen, Deborah S, NP  gabapentin (NEURONTIN) 300 MG capsule Take 1 capsule (300 mg total) by mouth 2 (two) times daily. Patient not taking: Reported on 04/04/2019 03/11/19   Sharee HolsterGreen, Deborah S, NP  ipratropium-albuterol (DUONEB) 0.5-2.5 (3) MG/3ML SOLN Inhale 3 mLs into the lungs 4 (four) times daily as needed (for shortness of breath). 03/11/19   Sharee HolsterGreen, Deborah S, NP  oxyCODONE-acetaminophen (PERCOCET/ROXICET) 5-325 MG tablet Take 1 tablet by mouth every 8 (eight) hours as needed for severe pain. Patient not taking: Reported on 04/04/2019 03/31/19   Mesner, Barbara CowerJason, MD  UNABLE TO FIND CPAP from home and previous home settings while sleeping    [provider]    Family History Family History  Problem Relation Age of Onset  . Hypertension Mother   . Stroke Mother   . Coronary artery disease Other        Multiple first and second-degree relatives  . Aneurysm Other        Cerebral circulation  . Colon cancer Sister   . Stroke Sister   . Coronary artery disease Sister   . Clotting disorder Other        Children diagnosed with hypercoagulable state  . Coronary artery disease Brother     Social History Social History   Tobacco Use  . Smoking status: Never Smoker  . Smokeless tobacco: Never Used  Substance Use Topics  . Alcohol use: No  . Drug use: No  Allergies   Codeine   Review of Systems Review of  Systems  All other systems reviewed and are negative.    Physical Exam Updated Vital Signs BP (!) 105/51   Pulse 85   Temp 99.7 F (37.6 C) (Rectal)   Resp 16   Ht 1.676 m ( )   Wt 91 kg   SpO2 92%   BMI 32.38 kg/m   Physical Exam Vitals signs and nursing note reviewed.  Constitutional:      General: She is not in acute distress.    Appearance: She is well-developed.     Comments: Elderly, frail  HENT:     Head: Normocephalic and atraumatic.     Right Ear: External ear normal.     Left Ear: External ear normal.  Eyes:     General: No scleral icterus.       Right eye: No discharge.        Left eye: No discharge.     Conjunctiva/sclera: Conjunctivae normal.  Neck:     Musculoskeletal: Neck supple.     Trachea: No tracheal deviation.  Cardiovascular:     Rate and Rhythm: Normal rate and regular rhythm.  Pulmonary:     Effort: Pulmonary effort is normal. No respiratory distress.     Breath sounds: Normal breath sounds. No stridor. No wheezing or rales.  Abdominal:     General: Bowel sounds are normal. There is no distension.     Palpations: Abdomen is soft.     Tenderness: There is no abdominal tenderness. There is no guarding or rebound.  Musculoskeletal:        General: No tenderness.  Skin:    General: Skin is warm and dry.     Findings: No rash.  Neurological:     Mental Status: She is alert.     GCS: GCS eye subscore is 4. GCS verbal subscore is 3. GCS motor subscore is 6.     Cranial Nerves: No cranial nerve deficit (no facial droop, extraocular movements intact, no slurred speech).     Sensory: No sensory deficit.     Motor: No abnormal muscle tone or seizure activity.     Coordination: Coordination normal.     Comments: Patient follows commands and moves extremities, unable to completely comply with neurologic exam, speech is confused and at times nonsensical      ED Treatments / Results  Labs (all labs ordered are listed, but only abnormal  results are displayed) Labs Reviewed  COMPREHENSIVE METABOLIC PANEL - Abnormal; Notable for the following components:      Result Value   Glucose, Bld 146 (*)    BUN 26 (*)    Creatinine, Ser 3.48 (*)    Total Protein 6.3 (*)    GFR calc non Af Amer 12 (*)    GFR calc Af Amer 14 (*)    All other components within normal limits  CBC WITH DIFFERENTIAL/PLATELET - Abnormal; Notable for the following components:   Hemoglobin 11.3 (*)    MCH 25.2 (*)    MCHC 29.7 (*)    RDW 17.0 (*)    Monocytes Absolute 1.3 (*)    All other components within normal limits  URINALYSIS, ROUTINE W REFLEX MICROSCOPIC - Abnormal; Notable for the following components:   APPearance HAZY (*)    Leukocytes,Ua MODERATE (*)    Bacteria, UA RARE (*)    All other components within normal limits  BRAIN NATRIURETIC PEPTIDE - Abnormal; Notable for the  following components:   B Natriuretic Peptide 265.0 (*)    All other components within normal limits  CULTURE, BLOOD (ROUTINE X 2)  CULTURE, BLOOD (ROUTINE X 2)  URINE CULTURE  LACTIC ACID, PLASMA  LACTIC ACID, PLASMA  PROTIME-INR  TROPONIN I    EKG EKG Interpretation  Date/Time:  Sunday April 04 2019 09:28:32 EDT Ventricular Rate:  92 PR Interval:    QRS Duration: 119 QT Interval:  369 QTC Calculation: 454 R Axis:   -72 Text Interpretation:  Sinus rhythm Nonspecific IVCD with LAD Inferior infarct, old Anterior infarct, old No significant change since last tracing Confirmed by Linwood Dibbles [phone]) on 04/04/2019 9:31:29 AM   Radiology Dg Chest 2 View  Result Date: 04/04/2019 CLINICAL DATA:  83 year old with acute mental status changes that began yesterday. Patient is currently undergoing treatment for a urinary tract infection. Acute chest pain that began earlier today. EXAM: CHEST - 2 VIEW COMPARISON:  CT chest and chest x-ray 03/17/2019 and earlier. FINDINGS: AP ERECT and LATERAL images were obtained. Markedly suboptimal inspiration. Prior sternotomy for  CABG. Cardiac silhouette mildly enlarged, unchanged. Pulmonary vascularity normal without evidence of pulmonary edema. Linear scarring scattered throughout both lungs, unchanged. Lungs otherwise clear. No visible pleural effusions. Prior ORIF of a LEFT humerus fracture. IMPRESSION: Markedly suboptimal inspiration. Stable mild cardiomegaly. No acute cardiopulmonary disease. Electronically Signed   By: Hulan Saas M.D.   On: 04/04/2019 11:11   Ct Head Wo Contrast  Result Date: 04/04/2019 CLINICAL DATA:  Altered level of consciousness. EXAM: CT HEAD WITHOUT CONTRAST TECHNIQUE: Contiguous axial images were obtained from the base of the skull through the vertex without intravenous contrast. COMPARISON:  CT head 03/31/2019 FINDINGS: Brain: Atrophy and chronic microvascular ischemic changes are stable. No new findings. No acute infarct, hemorrhage, or mass. Vascular: Negative for hyperdense vessel Skull: Negative Sinuses/Orbits: Small air-fluid level posterior ethmoid sinus on the right. Remaining sinuses clear. Bilateral cataract surgery. Other: None IMPRESSION: No acute abnormality and no change from the recent study. Electronically Signed   By: Marlan Palau M.D.   On: 04/04/2019 10:57   US Renal  Result Date: 04/04/2019 CLINICAL DATA:  Acute kidney injury EXAM: RENAL / URINARY TRACT ULTRASOUND COMPLETE COMPARISON:  05/19/2017 CT abdomen/pelvis. FINDINGS: Right Kidney: Renal measurements: 10.0 x 4.9 x 5.0 cm = volume: 129 mL. No hydronephrosis. Mildly echogenic and mildly atrophic right renal parenchyma. No right renal mass. Left Kidney: Renal measurements: 12.7 x 6.1 x 6.2 cm = volume: 251 mL. No hydronephrosis. Mildly echogenic renal parenchyma, normal in thickness. Simple appearing 2.2 x 1.9 x 1.9 cm interpolar left renal cyst. Bladder: Appears normal for degree of bladder distention. Ureteral jets are not visualized in the bladder on color Doppler. IMPRESSION: 1. No hydronephrosis. 2. Asymmetric right  renal atrophy. 3. Echogenic kidneys, indicative of nonspecific renal parenchymal disease of uncertain chronicity. 4. Simple appearing left renal cyst. 5. Normal bladder. Electronically Signed   By: Delbert Phenix M.D.   On: 04/04/2019 12:20    Procedures .Critical Care Performed by: Linwood Dibbles, MD Authorized by: Linwood Dibbles, MD   Critical care provider statement:    Critical care time (minutes):  35   Critical care was time spent personally by me on the following activities:  Discussions with consultants, evaluation of patient's response to treatment, examination of patient, ordering and performing treatments and interventions, ordering and review of laboratory studies, ordering and review of radiographic studies, pulse oximetry, re-evaluation of patient's condition, obtaining history from patient or  surrogate and review of old charts   (including critical care time)  Medications Ordered in ED Medications  sodium chloride 0.9 % bolus 1,000 mL (has no administration in time range)    Followed by  0.9 %  sodium chloride infusion (1,000 mLs Intravenous New Bag/Given 04/04/19 1118)  cefTRIAXone (ROCEPHIN) 1 g in sodium chloride 0.9 % 100 mL IVPB (1 g Intravenous New Bag/Given 04/04/19 1123)     Initial Impression / Assessment and Plan / ED Course  I have reviewed the triage vital signs and the nursing notes.  Pertinent labs & imaging results that were available during my care of the patient were reviewed by me and considered in my medical decision making (see chart for details).  Clinical Course as of Apr 03 1226  Sun Apr 04, 2019  1039 Discussed with family members what happened.  Pt was very confused this am.  She did not know who she was or where she was at.  She also started complaining of chest pain.   She had low grade fever , 101 yesterday.  She vomited once.     [JK]  1043 Labs reviewed.  AKI noted compared to prior creatinines.  Urinalysis is suggestive of urinary tract infection    [JK]  1227 Ultrasound, chest x-ray and CT reviewed.  No acute finding.   [JK]    Clinical Course User Index [JK] Linwood Dibbles, MD     Patient presented to the emergency room for evaluation of altered mental status and confusion.  Is currently living at home with her daughter.  She was feeling overwhelmed at the care that the patient was requiring.  Today however the patient was acutely confused and not acting normally.  Patient's ED work-up is notable for evidence of urinary tract infection and significant change in her creatinine consistent with an acute kidney injury.  Is possible this AKI and has contributed to her altered mental status.  Patient is hemodynamically stable.  No signs of sepsis.  The IV antibiotics were started.  IV fluids ordered.  Plan on admission for further treatment.  Final Clinical Impressions(s) / ED Diagnoses   Final diagnoses:  Altered mental status, unspecified altered mental status type  AKI (acute kidney injury) (HCC)  Acute cystitis without hematuria     Linwood Dibbles, MD 04/04/19 1229

## 2019-04-05 ENCOUNTER — Inpatient Hospital Stay (HOSPITAL_COMMUNITY): Payer: Medicare HMO

## 2019-04-05 ENCOUNTER — Encounter (HOSPITAL_COMMUNITY): Payer: Self-pay

## 2019-04-05 DIAGNOSIS — I5032 Chronic diastolic (congestive) heart failure: Secondary | ICD-10-CM

## 2019-04-05 DIAGNOSIS — N179 Acute kidney failure, unspecified: Principal | ICD-10-CM

## 2019-04-05 DIAGNOSIS — E1165 Type 2 diabetes mellitus with hyperglycemia: Secondary | ICD-10-CM

## 2019-04-05 DIAGNOSIS — J9601 Acute respiratory failure with hypoxia: Secondary | ICD-10-CM

## 2019-04-05 DIAGNOSIS — E1142 Type 2 diabetes mellitus with diabetic polyneuropathy: Secondary | ICD-10-CM

## 2019-04-05 DIAGNOSIS — L8990 Pressure ulcer of unspecified site, unspecified stage: Secondary | ICD-10-CM

## 2019-04-05 DIAGNOSIS — N3000 Acute cystitis without hematuria: Secondary | ICD-10-CM

## 2019-04-05 DIAGNOSIS — G9341 Metabolic encephalopathy: Secondary | ICD-10-CM

## 2019-04-05 DIAGNOSIS — E782 Mixed hyperlipidemia: Secondary | ICD-10-CM

## 2019-04-05 LAB — GLUCOSE, CAPILLARY
Glucose-Capillary: 126 mg/dL — ABNORMAL HIGH (ref 70–99)
Glucose-Capillary: 128 mg/dL — ABNORMAL HIGH (ref 70–99)
Glucose-Capillary: 301 mg/dL — ABNORMAL HIGH (ref 70–99)
Glucose-Capillary: 60 mg/dL — ABNORMAL LOW (ref 70–99)
Glucose-Capillary: 56 mg/dL — ABNORMAL LOW (ref 70–99)
Glucose-Capillary: 73 mg/dL (ref 70–99)
Glucose-Capillary: 88 mg/dL (ref 70–99)

## 2019-04-05 LAB — BLOOD GAS, ARTERIAL
Acid-base deficit: 2.7 mmol/L — ABNORMAL HIGH (ref 0.0–2.0)
Bicarbonate: 21.8 mmol/L (ref 20.0–28.0)
FIO2: 28
O2 Saturation: 95.1 %
Patient temperature: 37
pCO2 arterial: 47 mmHg (ref 32.0–48.0)
pH, Arterial: 7.305 — ABNORMAL LOW (ref 7.350–7.450)
pO2, Arterial: 82.3 mmHg — ABNORMAL LOW (ref 83.0–108.0)

## 2019-04-05 LAB — CBC
HCT: 33.3 % — ABNORMAL LOW (ref 36.0–46.0)
Hemoglobin: 9.5 g/dL — ABNORMAL LOW (ref 12.0–15.0)
MCH: 24.7 pg — ABNORMAL LOW (ref 26.0–34.0)
MCHC: 28.5 g/dL — ABNORMAL LOW (ref 30.0–36.0)
MCV: 86.5 fL (ref 80.0–100.0)
Platelets: 244 10*3/uL (ref 150–400)
RBC: 3.85 MIL/uL — ABNORMAL LOW (ref 3.87–5.11)
RDW: 17.1 % — ABNORMAL HIGH (ref 11.5–15.5)
WBC: 7.7 10*3/uL (ref 4.0–10.5)
nRBC: 0 % (ref 0.0–0.2)

## 2019-04-05 LAB — URINE CULTURE: Culture: NO GROWTH

## 2019-04-05 LAB — TSH: TSH: 0.521 u[IU]/mL (ref 0.350–4.500)

## 2019-04-05 LAB — PROCALCITONIN: Procalcitonin: 0.14 ng/mL

## 2019-04-05 LAB — BASIC METABOLIC PANEL
Anion gap: 10 (ref 5–15)
BUN: 26 mg/dL — ABNORMAL HIGH (ref 8–23)
CO2: 22 mmol/L (ref 22–32)
Calcium: 8.2 mg/dL — ABNORMAL LOW (ref 8.9–10.3)
Chloride: 109 mmol/L (ref 98–111)
Creatinine, Ser: 3.64 mg/dL — ABNORMAL HIGH (ref 0.44–1.00)
GFR calc Af Amer: 13 mL/min — ABNORMAL LOW (ref >60)
GFR calc non Af Amer: 11 mL/min — ABNORMAL LOW (ref >60)
Glucose, Bld: 118 mg/dL — ABNORMAL HIGH (ref 70–99)
Potassium: 4.5 mmol/L (ref 3.5–5.1)
Sodium: 141 mmol/L (ref 135–145)

## 2019-04-05 LAB — AMMONIA: Ammonia: <9 umol/L — ABNORMAL LOW (ref 9–35)

## 2019-04-05 LAB — D-DIMER, QUANTITATIVE: D-Dimer, Quant: 0.95 ug/mL-FEU — ABNORMAL HIGH (ref 0.00–0.50)

## 2019-04-05 LAB — MAGNESIUM: Magnesium: 1.9 mg/dL (ref 1.7–2.4)

## 2019-04-05 LAB — VITAMIN B12: Vitamin B-12: 724 pg/mL (ref 180–914)

## 2019-04-05 LAB — FOLATE: Folate: 20.6 ng/mL (ref >5.9)

## 2019-04-05 MED ORDER — SODIUM CHLORIDE 0.9 % IV SOLN
2.0000 g | INTRAVENOUS | Status: DC
  Administered 2019-04-05 – 2019-04-06 (×2): 2 g via INTRAVENOUS
  Filled 2019-04-05 (×4): qty 20

## 2019-04-05 MED ORDER — TECHNETIUM TO 99M ALBUMIN AGGREGATED
4.0000 | Freq: Once | INTRAVENOUS | Status: AC | PRN
  Administered 2019-04-05: 4.5 via INTRAVENOUS

## 2019-04-05 NOTE — Progress Notes (Signed)
Hypoglycemic Event  CBG: 56  Treatment: 4 oz juice/soda  Symptoms: None  Follow-up CBG: Time: 2243 CBG Result:73   Possible Reasons for Event: Inadequate meal intake  Comments/MD notified: Midlevel made aware. Will continue to monitor.    Linn Clavin A [employer]

## 2019-04-05 NOTE — Evaluation (Signed)
Physical Therapy Evaluation Patient Details Name: [name] [name] MRN: [id number] DOB: 1936/09/25 Today's Date: 04/05/2019   History of Present Illness  [name] [name] is a 83 y.o. female with medical history of diabetes mellitus type 2, chronic diastolic CHF, stroke, COPD, hypothyroidism, hypertension, coronary disease, hyperlipidemia, OSA presenting with altered mental status.  The patient has had numerous hospitalizations with similar presentation.  Most recently, the patient was discharged from the hospital after a stay from 03/17/2019 through 03/20/2019 for acute metabolic encephalopathy that was multifactorial including UTI and hypercarbia.  In addition, the patient had another admission from 02/22/2019 through 02/26/2019 for encephalopathy secondary to hypoglycemia and UTI.  Since her discharge from the hospital on 03/20/2019, the patient was discharged home to live with her daughter.  The patient had refused to go to a skilled nursing facility at that time.  Initially, the patient was able to stand and take a few steps with a walker.  However over the past week, the patient's family has noted increasing generalized weakness, decreased oral intake, and more somnolence.  On 03/29/2019, the patient was complaining of some dysuria.  Her home health nurse called the patient's PCP.  The patient was prescribed ciprofloxacin.  Since that date, the patient has had increasing somnolence which was significantly worse on 04/03/2019.  During this past week, the patient has had significantly decreased oral intake.  According to the patient's family, the patient was somnolent and slept most of the day.  When she woke up on 04/04/2019, the patient complained of chest discomfort.  As result, EMS was activated.  During this past week, the patient has not had any recurrent discomfort or complained of any chronic worsening cough or shortness of breath.  The patient did have a low-grade temperature of 100.2 F on 04/03/2019.  There  is been no complaints of headaches, nausea, vomiting, diarrhea, abdominal pain.  According to the patient's daughter, they have been compliant with social distancing in the context of the coronavirus pandemic.  There is been no sick contacts.  Other than the ciprofloxacin, there has not been any new medications.    Clinical Impression  Patient demonstrates slow labored movement for sitting up at bedside with c/o moderate/severe pain over buttocks when scooting to bedside, severe fall risk due to BLE weakness and poor coordination of legs with frequent near falls, limited to a few steps [facility] and tolerated sitting up in chair after therapy - RN notified.  Patient will benefit from continued physical therapy in hospital and recommended venue below to increase strength, balance, endurance for safe ADLs and gait.    Follow Up Recommendations SNF;Supervision/Assistance - 24 hour;Supervision for mobility/OOB    Equipment Recommendations  None recommended by PT    Recommendations for Other Services       Precautions / Restrictions Precautions Precautions: Fall Restrictions Weight Bearing Restrictions: No      Mobility  Bed Mobility Overal bed mobility: Needs Assistance Bed Mobility: Supine to Sit     Supine to sit: Mod assist;Max assist     General bed mobility comments: slow labored movement, c/o buttuck pain when scooting to bedside  Transfers Overall transfer level: Needs assistance Equipment used: Rolling walker (2 wheeled) Transfers: Stand Pivot Transfers;Sit to/from Stand Sit to Stand: Min assist Stand pivot transfers: Mod assist       General transfer comment: very unsteady on feet with poor coordination of legs  Ambulation/Gait Ambulation/Gait assistance: Max assist Gait Distance (Feet): 5 Feet Assistive device:  Rolling walker (2 wheeled) Gait Pattern/deviations: Decreased step length - right;Decreased step length - left;Decreased stride  length;Shuffle Gait velocity: slow   General Gait Details: limited to 5-6 short unsteady steps with occasional buckling of knees due to weakness, demonstrates poor coordination of legs and requires repeated verbal/tactile cueing for safety  Stairs            Wheelchair Mobility    Modified Rankin (Stroke Patients Only)       Balance Overall balance assessment: Needs assistance Sitting-balance support: Feet supported;No upper extremity supported Sitting balance-Leahy Scale: Fair     Standing balance support: Bilateral upper extremity supported;During functional activity Standing balance-Leahy Scale: Poor Standing balance comment: fair/poor using RW                             Pertinent Vitals/Pain Pain Assessment: Faces Faces Pain Scale: Hurts little more Pain Location: buttucks when seated Pain Descriptors / Indicators: Sore;Discomfort;Guarding;Grimacing Pain Intervention(s): Limited activity within patient's tolerance;Monitored during session    Home Living Family/patient expects to be discharged to:: Private residence Living Arrangements: Children Available Help at Discharge: Family Type of Home: House Home Access: Stairs to enter Entrance Stairs-Rails: Right;Left;Can reach both Secretary/administrator of Steps: 2 Home Layout: One level Home Equipment: Shower seat;Cane - single point;Walker - 2 wheels;Bedside commode Additional Comments: Patient appears slightly confused and poor historian, info taken from previous admission    Prior Function Level of Independence: Needs assistance   Gait / Transfers Assistance Needed: Short distanced household ambulator  ADL's / Homemaking Assistance Needed: assisted by family        Hand Dominance   Dominant Hand: Right    Extremity/Trunk Assessment   Upper Extremity Assessment Upper Extremity Assessment: Generalized weakness    Lower Extremity Assessment Lower Extremity Assessment: Generalized  weakness    Cervical / Trunk Assessment Cervical / Trunk Assessment: Normal  Communication   Communication: Receptive difficulties(possibly due to confusion)  Cognition Arousal/Alertness: Awake/alert Behavior During Therapy: Flat affect;Impulsive Overall Cognitive Status: Impaired/Different from baseline Area of Impairment: Attention;Following commands                   Current Attention Level: Selective   Following Commands: Follows one step commands with increased time       General Comments: appears slightly confused      General Comments      Exercises     Assessment/Plan    PT Assessment Patient needs continued PT services  PT Problem List Decreased strength;Decreased activity tolerance;Decreased balance;Decreased mobility       PT Treatment Interventions Gait training;Stair training;Functional mobility training;Therapeutic activities;Patient/family education;Therapeutic exercise    PT Goals (Current goals can be found in the Care Plan section)  Acute Rehab PT Goals Patient Stated Goal: return home PT Goal Formulation: With patient Time For Goal Achievement: 04/12/19 Potential to Achieve Goals: Fair    Frequency Min 3X/week   Barriers to discharge        Co-evaluation               AM-PAC PT "6 Clicks" Mobility  Outcome Measure Help needed turning from your back to your side while in a flat bed without using bedrails?: A Lot Help needed moving from lying on your back to sitting on the side of a flat bed without using bedrails?: A Lot Help needed moving to and from a bed to a chair (including a wheelchair)?: A Lot Help needed  standing up from a chair using your arms (e.g., wheelchair or bedside chair)?: A Lot Help needed to walk in hospital room?: A Lot Help needed climbing 3-5 steps with a railing? : Total 6 Click Score: 11    End of Session Equipment Utilized During Treatment: Oxygen Activity Tolerance: Patient tolerated treatment  well;Patient limited by fatigue Patient left: in chair;with call bell/phone within reach;with chair alarm set Nurse Communication: Mobility status PT Visit Diagnosis: Unsteadiness on feet (R26.81);Other abnormalities of gait and mobility (R26.89);Muscle weakness (generalized) (M62.81)    Time: 5176-1607 PT Time Calculation (min) (ACUTE ONLY): 28 min   Charges:   PT Evaluation $PT Eval Moderate Complexity: 1 Mod PT Treatments $Therapeutic Activity: 23-37 mins        2:32 PM, 04/05/19 Ocie Bob, MPT Physical Therapist with [facility] [phone] office [phone] mobile phone

## 2019-04-05 NOTE — Progress Notes (Signed)
Palliative Medicine consult order noted. PMT provider will return to [facility] on 04/06/19. If the patient remains hospitalized, the consult will be evaluated at that time. If recommendations are needed in the interim, please call our office at [phone].   Margret Chance Michelina Mexicano, RN, BSN, [facility] Palliative Medicine Team 04/05/2019 10:37 AM Office [phone]

## 2019-04-05 NOTE — TOC Initial Note (Signed)
Transition of Care [facility](TOC) - Initial/Assessment Note    Patient Details  Name: [name] AuerRuby J [name] MRN: [id number] Date of Birth: [birth year]  Transition of Care [facility](TOC) CM/SW Contact:    Ida Rogueodney B Demarcus Thielke, LCSW Phone Number: 04/05/2019, 3:38 PM  Clinical Narrative:    When told recommendation of PT and DR is SNF, and patient was asked her reaction to that, she replied "I haven't thought about it."  When pressed, was unwilling to give further answer.  [facility] and she stated she would prefer to not return there, but did not give a reason why.   Said she would consider [place] facility in  [place], but again was non-commital. CSW called grandaughter, who expressed concern that I was talking about SNF when the Dr talked to pt's daughter earlier today about hospice.  I explained that hospice has not seen patient yet, and would be planning to do so tomorrow.  Granddaughter asked that I call her mother [name] for further communication.  Also told me that pt's great grandson, Alvin CritchleySamuel Perguson, is healthcare POA if pt is unable to speak for self.            Expected Discharge Plan: Home w Hospice Care(or SNF) Barriers to Discharge: SNF Pending bed offer(Awaiting hospice consult)   Patient Goals and CMS Choice Patient states their goals for this hospitalization and ongoing recovery are:: "I haven't thought about it." CMS Medicare.gov Compare Post Acute Care list provided to:: Patient Choice offered to / list presented to : Patient  Expected Discharge Plan and Services Expected Discharge Plan: Home w Hospice Care(or SNF) In-house Referral: Hospice / Palliative Care Discharge Planning Services: CM Consult Post Acute Care Choice: Hospice(or SNF) Living arrangements for the past 2 months: Single Family Home                          Prior Living Arrangements/Services Living arrangements for the past 2 months: Single Family Home Lives with:: Adult Children Patient  language and need for interpreter reviewed:: Yes Do you feel safe going back to the place where you live?: Yes      Need for Family Participation in Patient Care: Yes (Comment) Care giver support system in place?: Yes (comment) Current home services: Home PT, Home RN, Homehealth aide Criminal Activity/Legal Involvement Pertinent to Current Situation/Hospitalization: No - Comment as needed  Activities of Daily Living Home Assistive Devices/Equipment: Bedside commode/3-in-1, Hospital bed, Walker (specify type), Wheelchair, Other (Comment)(glucometer with diabetic testing supplies ) ADL Screening (condition at time of admission) Patient's cognitive ability adequate to safely complete daily activities?: No Is the patient deaf or have difficulty hearing?: No Does the patient have difficulty seeing, even when wearing glasses/contacts?: No Does the patient have difficulty concentrating, remembering, or making decisions?: Yes Patient able to express need for assistance with ADLs?: Yes Does the patient have difficulty dressing or bathing?: Yes Independently performs ADLs?: No Communication: Independent Dressing (OT): Dependent Is this a change from baseline?: Pre-admission baseline Grooming: Dependent Is this a change from baseline?: Pre-admission baseline Feeding: Needs assistance Is this a change from baseline?: Pre-admission baseline Bathing: Dependent Is this a change from baseline?: Pre-admission baseline Toileting: Needs assistance Is this a change from baseline?: Pre-admission baseline In/Out Bed: Dependent Is this a change from baseline?: Pre-admission baseline Walks in Home: Dependent Is this a change from baseline?: Pre-admission baseline Does the patient have difficulty walking or climbing stairs?: Yes Weakness of Legs:  Both Weakness of Arms/Hands: Both  Permission Sought/Granted Permission sought to share information with : Family Supports    Share Information with NAME:  Daughter Varney Baas, [place] [employer]           Emotional Assessment Appearance:: Appears stated age Attitude/Demeanor/Rapport: Engaged Affect (typically observed): Guarded Orientation: : Oriented to Self, Oriented to Place Alcohol / Substance Use: Not Applicable Psych Involvement: No (comment)  Admission diagnosis:  Acute cystitis without hematuria [N30.00] AKI (acute kidney injury) (HCC) [N17.9] Altered mental status, unspecified altered mental status type [R41.82] Patient Active Problem List   Diagnosis Date Noted  . Pressure injury of skin 04/05/2019  . Acute respiratory failure with hypoxia (HCC) 04/05/2019  . Goals of care, counseling/discussion 04/04/2019  . Acute cystitis without hematuria   . Altered mental status   . Acute metabolic encephalopathy 03/17/2019  . Chronic diastolic CHF (congestive heart failure) (HCC) 03/17/2019  . Iron deficiency anemia 03/12/2019  . Protein-calorie malnutrition, moderate (HCC) 03/12/2019  . Hypertension associated with type 2 diabetes mellitus (HCC) 03/09/2019  . GERD without esophagitis 03/09/2019  . Dyslipidemia associated with type 2 diabetes mellitus (HCC) 03/09/2019  . Uncontrolled type 2 diabetes mellitus with peripheral neuropathy (HCC) 03/09/2019  . Major depression, recurrent, chronic (HCC) 03/09/2019  . Hypoglycemia 02/22/2019  . Generalized weakness 02/12/2019  . Fall at home 02/12/2019  . Symptomatic Yeast UTI 02/12/2019  . Leukocytosis 02/12/2019  . AKI (acute kidney injury) (HCC) 02/12/2019  . COPD with acute exacerbation (HCC) 12/13/2018  . Syncope   . Loss of consciousness (HCC) 06/10/2018  . Bradycardia, sinus 06/10/2018  . Diastolic dysfunction 09/29/2017  . CAD (coronary artery disease) 09/28/2017  . Acute lower UTI 09/28/2017  . Ischemic stroke (HCC) 05/10/2017  . Acute ischemic stroke (HCC) 05/10/2017  . Dysarthria 03/18/2016  . Anemia of chronic disease 11/19/2015  . OSA (obstructive  sleep apnea) 11/19/2015  . Left facial numbness 08/29/2015  . Gait instability 06/26/2015  . COPD exacerbation (HCC) 11/13/2014  . Weakness generalized 12/10/2013  . TIA (transient ischemic attack) 06/22/2012  . Left-sided headache 06/22/2012  . Noncompliance 06/22/2012  . Noncompliance with CPAP treatment 06/22/2012  . Psoriasis 01/07/2012  . Chronic back pain   . Chronic obstructive pulmonary disease (HCC)   . DM type 2 causing complication (HCC) 02/14/2010  . Obesity 02/14/2010  . Arteriosclerotic cardiovascular disease (ASCVD) 02/14/2010  . Hypothyroidism 02/06/2010  . Hyperlipidemia 02/06/2010  . Essential hypertension 02/06/2010  . Sleep apnea 02/06/2010   PCP:  Oval Linsey, MD Pharmacy:   [phone] - [place], [place] - [address] [place] [address] Phone: [phone] Fax: [phone]  [id number] - [place], [place] - [address] AT [facility] [address] [place] [address] Phone: [phone] Fax: [phone]  [facility] [place], [place] - [address] [place] [address] Phone: [phone] Fax: [phone]     Social Determinants of Health (SDOH) Interventions    Readmission Risk Interventions Readmission Risk Prevention Plan 03/18/2019  Transportation Screening Complete  HRI or Home Care Consult Complete  SW Recovery Care/Counseling Consult Complete  Palliative Care Screening Not Applicable  Some recent data might be hidden

## 2019-04-05 NOTE — Plan of Care (Signed)
  Problem: Acute Rehab PT Goals(only PT should resolve) Goal: Pt Will Go Supine/Side To Sit Outcome: Progressing Flowsheets (Taken 04/05/2019 1433) Pt will go Supine/Side to Sit: with minimal assist Goal: Patient Will Transfer Sit To/From Stand Outcome: Progressing Flowsheets (Taken 04/05/2019 1433) Patient will transfer sit to/from stand: with minimal assist Goal: Pt Will Transfer Bed To Chair/Chair To Bed Outcome: Progressing Flowsheets (Taken 04/05/2019 1433) Pt will Transfer Bed to Chair/Chair to Bed: with min assist Goal: Pt Will Ambulate Outcome: Progressing Flowsheets (Taken 04/05/2019 1433) Pt will Ambulate: 25 feet; with minimal assist; with moderate assist; with rolling walker   2:34 PM, 04/05/19 Ocie Bob, MPT Physical Therapist with [facility] [phone] office [phone] mobile phone

## 2019-04-05 NOTE — Consult Note (Signed)
WOC Nurse wound consult note Reason for Consult:Pressure and moisture injury to skin Wound type:pressure plus moisture (Stage 3) Pressure Injury POA: Yes Measurement: Location is across coccygeal area: 3cm x 2xm x 0.1cm with a central injury at the gluteal cleft and slightly larger on the left than right side of the cleft onto the buttocks Wound SMO:LMBE, moist Drainage (amount, consistency, odor) Serous in a small amount Periwound: macerated with urine.  Patient now with PurWick in place Dressing procedure/placement/frequency: I will add a mattress replacement with low air loss feature in an attempt to dry skin and provide pressure redistribution. We will treat the open lesions (Stage 3 Pressure injury) with a silicone foam and turn and reposition from side to side. A chair cushion with pressure redistribution properties will be provided for post discharge as it is likely she will be in a chair.  WOC nursing team will not follow, but will remain available to this patient, the nursing and medical teams.  Please re-consult if needed. Thanks, Ladona Mow, MSN, RN, GNP, [facility]  Pager# [phone]

## 2019-04-05 NOTE — Progress Notes (Addendum)
PROGRESS NOTE  [name] [name]:[id number] DOB: 06-17-1936 DOA: 04/04/2019 PCP: Oval Linsey, MD  Brief History:  83 y.o. female with medical history of diabetes mellitus type 2, chronic diastolic CHF, stroke, COPD, hypothyroidism, hypertension, coronary disease, hyperlipidemia, OSA presenting with altered mental status.  The patient has had numerous hospitalizations with similar presentation.  Most recently, the patient was discharged from the hospital after a stay from 03/17/2019 through 03/20/2019 for acute metabolic encephalopathy that was multifactorial including UTI and hypercarbia.  In addition, the patient had another admission from 02/22/2019 through 02/26/2019 for encephalopathy secondary to hypoglycemia and UTI.  Since her discharge from the hospital on 03/20/2019, the patient was discharged home to live with her daughter.  The patient had refused to go to a skilled nursing facility at that time.  Initially, the patient was able to stand and take a few steps with a walker.  However over the past week, the patient's family has noted increasing generalized weakness, decreased oral intake, and more somnolence.  On 03/29/2019, the patient was complaining of some dysuria.  Her home health nurse called the patient's PCP.  The patient was prescribed ciprofloxacin.  Since that date, the patient has had increasing somnolence which was significantly worse on 04/03/2019.  During this past week, the patient has had significantly decreased oral intake.  According to the patient's family, the patient was somnolent and slept most of the day.  When she woke up on 04/04/2019, the patient complained of chest discomfort.  As result, EMS was activated.  During this past week, the patient has not had any recurrent discomfort or complained of any chronic worsening cough or shortness of breath.  The patient did have a low-grade temperature of 100.2 F on 04/03/2019.  There is been no complaints of headaches,  nausea, vomiting, diarrhea, abdominal pain.  According to the patient's daughter, they have been compliant with social distancing in the context of the coronavirus pandemic.  There is been no sick contacts.  Other than the ciprofloxacin, there has not been any new medications. In the emergency department, the patient had a low-grade temperature of 99.7 F.  She was hemodynamically stable.  Oxygen saturations were 92--95% on room air.  BMP showed a serum creatinine of 3.48 which was significantly above her usual baseline.  WBC was 10.2 with hemoglobin 11.3 and platelets 270,000.  Chest x-ray showed poor inspiration without consolidation or edema.  Renal ultrasound was negative for hydronephrosis but showed right renal atrophy.  CT of the brain was negative for acute findings.  Lactic acid was 1.8.  Urinalysis showed 21-50 WBC.  The patient was given a liter bolus of normal saline and started on ceftriaxone.  Assessment/Plan: Acute metabolic encephalopathy -Secondary to acute kidney injury and UTI, ciprofloxacin as well as other hypnotic medications including oxycodone/gabapentin which have been discontinued -At baseline, the patient has poor functional status requiring assistance with all aspects of activities of daily living -She has been essentially bedbound for the past week -pt is more awake, but falls asleep without any stimulation -At baseline, the patient is alert and oriented x3.  Acute kidney injury -Secondary to volume depletion and hemodynamic changes (low BPs) -baseline creatinine 0.7-0.8 -serum creatinine peaked 3.64 -continue IV fluids -Renal ultrasound negative for hydronephrosis -Discontinue lisinopril  Pyuria -Concerning for UTI -Continue ceftriaxone pending culture data  Hypoxia/Acute respiratory failure with hypoxia -suspect due to OSA/OHS and hypoventilation -ABG -check D-dimer; may need CT chest  Essential hypertension -Holding lisinopril in the setting of acute  kidney injury -Holding amlodipine secondary to soft blood pressure  Diabetes mellitus type 2, uncontrolled with hyperglycemia -As the patient has decreased oral intake, hold 70/30 insulin -NovoLog sliding scale -02/18/2019 hemoglobin A1c 8.5  Failure to thrive in adult -The patient's daughter relates that functional and cognitive decline over the past 4 to 5 months -Goals of care discussed--patient is DNR -PT evaluation -Serum B12 -Folic acid -ammonia -03/18/2019 TSH 0.558  Hyperlipidemia -Continue statin  Hypothyroidism -Continue levothyroxine  History of multiple strokes -Continue aspirin and Plavix secondary to severe right PCA stenosis -This was recommended by neurology during consultation in February 2020  Goals of Care -Advance care planning, including the explanation and discussion of advance directives was carried out with the patient and family.  Code status including explanations of "Full Code" and "DNR" and alternatives were discussed in detail.  Discussion of end-of-life issues including but not limited palliative care, hospice care and the concept of hospice, other end-of-life care options, power of attorney for health care decisions, living wills, and physician orders for life-sustaining treatment were also discussed with the patient and family.  Total face to face time 16 minutes. -DNR -consult palliative medicine--This is 4th hospitalization in past 2 months and 8 hospitalizations in past 12 months family endorses continued cognitive and functional decline over past 4-5 months  Stage 2 sacral decubitus -present prior to admission -consult wound care -see pictures below    Disposition Plan:   SNF when improved, likely 2-3 days  Family Communication:   Family at bedside  Consultants:  Palliative medicine  Code Status:  DNR  DVT Prophylaxis:  [place] Heparin   Procedures: As Listed in Progress Note Above  Antibiotics: None  RN Pressure Injury  Documentation: Pressure Injury 04/04/19 Stage III -  Full thickness tissue loss. Subcutaneous fat may be visible but bone, tendon or muscle are NOT exposed. 90% pink 10% slough (Active)  04/04/19 1445  Location: Buttocks  Location Orientation: Right  Staging: Stage III -  Full thickness tissue loss. Subcutaneous fat may be visible but bone, tendon or muscle are NOT exposed.  Wound Description (Comments): 90% pink 10% slough  Present on Admission: Yes     Pressure Injury 04/04/19 Stage II -  Partial thickness loss of dermis presenting as a shallow open ulcer with a red, pink wound bed without slough. 100% pink  (Active)  04/04/19 1445  Location: Buttocks  Location Orientation: Left  Staging: Stage II -  Partial thickness loss of dermis presenting as a shallow open ulcer with a red, pink wound bed without slough.  Wound Description (Comments): 100% pink   Present on Admission: Yes        Subjective: Pt is awake and alert.  Follows one step commands.  Occasionally answers questions.  Denies cp, sob, n/v/d, abd pain.  Remainder ROS unobtainable  Objective: Vitals:   04/04/19 1700 04/04/19 2139 04/04/19 2147 04/05/19 0525  BP: 133/67 113/90  (!) 117/51  Pulse: 79 89  73  Resp: 18 16  17   Temp: 98.3 F (36.8 C) 99.3 F (37.4 C)  98.4 F (36.9 C)  TempSrc:  Oral  Oral  SpO2: 97% (!) 76% 93% 94%  Weight:      Height:        Intake/Output Summary (Last 24 hours) at 04/05/2019 1058 Last data filed at 04/05/2019 0900 Gross per 24 hour  Intake 3223.38 ml  Output --  Net 3223.38 ml  Weight change:  Exam:   General:  Pt is alert, follows commands appropriately, not in acute distress; somnolent  HEENT: No icterus, No thrush, No neck mass, [place]/AT  Cardiovascular: RRR, S1/S2, no rubs, no gallops  Respiratory: bilateral crackles, no wheeze  Abdomen: Soft/+BS, non tender, non distended, no guarding  Extremities: No edema, No lymphangitis, No petechiae, No rashes, no  synovitis SACRAL/BUTTOCK WOUND      Data Reviewed: I have personally reviewed following labs and imaging studies Basic Metabolic Panel: Recent Labs  Lab 04/04/19 0942 04/05/19 0458  NA 139 141  K 4.8 4.5  CL 103 109  CO2 25 22  GLUCOSE 146* 118*  BUN 26* 26*  CREATININE 3.48* 3.64*  CALCIUM 9.1 8.2*  MG  --  1.9   Liver Function Tests: Recent Labs  Lab 04/04/19 0942  AST 19  ALT 13  ALKPHOS 53  BILITOT 1.0  PROT 6.3*  ALBUMIN 3.5   No results for input(s): LIPASE, AMYLASE in the last 168 hours. No results for input(s): AMMONIA in the last 168 hours. Coagulation Profile: Recent Labs  Lab 04/04/19 0942  INR 1.2   CBC: Recent Labs  Lab 04/04/19 0942 04/05/19 0458  WBC 10.2 7.7  NEUTROABS 7.3  --   HGB 11.3* 9.5*  HCT 38.1 33.3*  MCV 84.9 86.5  PLT 270 244   Cardiac Enzymes: Recent Labs  Lab 04/04/19 0944 04/04/19 1529 04/04/19 2228  CKTOTAL  --  98  --   TROPONINI <0.03 <0.03 <0.03   BNP: Invalid input(s): POCBNP CBG: Recent Labs  Lab 04/04/19 1622 04/04/19 2130 04/05/19 0747  GLUCAP 133* 141* 126*   HbA1C: No results for input(s): HGBA1C in the last 72 hours. Urine analysis:    Component Value Date/Time   COLORURINE YELLOW 04/04/2019 0932   APPEARANCEUR HAZY (A) 04/04/2019 0932   LABSPEC 1.006 04/04/2019 0932   PHURINE 5.0 04/04/2019 0932   GLUCOSEU NEGATIVE 04/04/2019 0932   HGBUR NEGATIVE 04/04/2019 0932   BILIRUBINUR NEGATIVE 04/04/2019 0932   KETONESUR NEGATIVE 04/04/2019 0932   PROTEINUR NEGATIVE 04/04/2019 0932   UROBILINOGEN 0.2 08/29/2015 1842   NITRITE NEGATIVE 04/04/2019 0932   LEUKOCYTESUR MODERATE (A) 04/04/2019 0932   Sepsis Labs: (procalcitonin:4,lacticidven:4) ) Recent Results (from the past 240 hour(s))  Culture, blood (Routine x 2)     Status: None (Preliminary result)   Collection Time: 04/04/19  9:32 AM  Result Value Ref Range Status   Specimen Description   Final    BLOOD LEFT HAND Blood  Culture results may not be optimal due to an inadequate volume of blood received in culture bottles BOTTLES DRAWN AEROBIC AND ANAEROBIC   Special Requests NONE  Final   Culture   Final    NO GROWTH < 24 HOURS Performed at [facility], [address], [place], [place] [address]    Report Status PENDING  Incomplete  Culture, blood (Routine x 2)     Status: None (Preliminary result)   Collection Time: 04/04/19  9:37 AM  Result Value Ref Range Status   Specimen Description   Final    BLOOD RIGHT ANTECUBITAL Blood Culture results may not be optimal due to an excessive volume of blood received in culture bottles BOTTLES DRAWN AEROBIC AND ANAEROBIC   Special Requests NONE  Final   Culture   Final    NO GROWTH < 24 HOURS Performed at [facility], [address], [place], [place] [address]    Report Status PENDING  Incomplete  Scheduled Meds:  aspirin EC  81 mg Oral Daily   citalopram  20 mg Oral Daily   clopidogrel  75 mg Oral Daily   heparin  5,000 Units Subcutaneous Q8H   insulin aspart  0-5 Units Subcutaneous QHS   insulin aspart  0-9 Units Subcutaneous TID WC   levothyroxine  25 mcg Oral Q breakfast   pantoprazole  40 mg Oral Q0600   simvastatin  20 mg Oral q1800   Continuous Infusions:  sodium chloride Stopped (04/04/19 1200)   sodium chloride 75 mL/hr at 04/05/19 0617   cefTRIAXone (ROCEPHIN)  IV 2 g (04/05/19 0835)    Procedures/Studies: Dg Chest 1 View  Result Date: 03/17/2019 CLINICAL DATA:  Altered mental status. EXAM: CHEST  1 VIEW COMPARISON:  02/12/2019 FINDINGS: Postsurgical changes from CABG. The cardiac silhouette is enlarged. There is an apparent widening of the mediastinum, when compared to patient's prior radiographs. There is no evidence of focal airspace consolidation, or pneumothorax. Possible right pleural effusion. Osseous structures are without acute abnormality. Soft tissues are grossly normal. IMPRESSION: 1. Apparent widening of the  mediastinum, when compared to patient's prior radiographs. This may be due to poor inspiratory effort, however vascular anomaly can not be excluded. Please correlate to patient's symptomatology. If the patient displays signs of acute aortic syndrome, then CT angiogram of the chest should be performed. Otherwise PA and lateral radiograph the chest may be considered. 2. Possible right pleural effusion. These results were called by telephone at the time of interpretation on 03/17/2019 at 1:43 pm to Dr. Samuel Jester , who verbally acknowledged these results. Electronically Signed   By: Ted Mcalpine M.D.   On: 03/17/2019 13:45   Dg Chest 2 View  Result Date: 04/04/2019 CLINICAL DATA:  83 year old with acute mental status changes that began yesterday. Patient is currently undergoing treatment for a urinary tract infection. Acute chest pain that began earlier today. EXAM: CHEST - 2 VIEW COMPARISON:  CT chest and chest x-ray 03/17/2019 and earlier. FINDINGS: AP ERECT and LATERAL images were obtained. Markedly suboptimal inspiration. Prior sternotomy for CABG. Cardiac silhouette mildly enlarged, unchanged. Pulmonary vascularity normal without evidence of pulmonary edema. Linear scarring scattered throughout both lungs, unchanged. Lungs otherwise clear. No visible pleural effusions. Prior ORIF of a LEFT humerus fracture. IMPRESSION: Markedly suboptimal inspiration. Stable mild cardiomegaly. No acute cardiopulmonary disease. Electronically Signed   By: Hulan Saas M.D.   On: 04/04/2019 11:11   Dg Pelvis 1-2 Views  Result Date: 03/31/2019 CLINICAL DATA:  Fall 2 days ago with left pelvis/hip and femur pain. Recent fall leading to iliopsoas tendon tear. EXAM: PELVIS - 1-2 VIEW COMPARISON:  Pelvis and hip radiographs 03/17/2019. Pelvic MRI 03/19/2019 FINDINGS: The cortical margins of the bony pelvis are intact. No fracture. Pubic symphysis and sacroiliac joints are congruent. Both femoral heads are  well-seated in the respective acetabula. There are vascular calcifications. IMPRESSION: No evidence of pelvic fracture. Electronically Signed   By: Narda Rutherford M.D.   On: 03/31/2019 02:06   Ct Head Wo Contrast  Result Date: 04/04/2019 CLINICAL DATA:  Altered level of consciousness. EXAM: CT HEAD WITHOUT CONTRAST TECHNIQUE: Contiguous axial images were obtained from the base of the skull through the vertex without intravenous contrast. COMPARISON:  CT head 03/31/2019 FINDINGS: Brain: Atrophy and chronic microvascular ischemic changes are stable. No new findings. No acute infarct, hemorrhage, or mass. Vascular: Negative for hyperdense vessel Skull: Negative Sinuses/Orbits: Small air-fluid level posterior ethmoid sinus on the right. Remaining sinuses clear. Bilateral  cataract surgery. Other: None IMPRESSION: No acute abnormality and no change from the recent study. Electronically Signed   By: Marlan Palau M.D.   On: 04/04/2019 10:57   Ct Head Wo Contrast  Result Date: 03/31/2019 CLINICAL DATA:  Initial evaluation for acute trauma, recent fall. EXAM: CT HEAD WITHOUT CONTRAST TECHNIQUE: Contiguous axial images were obtained from the base of the skull through the vertex without intravenous contrast. COMPARISON:  Prior CT from 03/17/2019. FINDINGS: Brain: Generalized age-related cerebral atrophy with moderate chronic microvascular ischemic disease. No acute intracranial hemorrhage. No acute large vessel territory infarct. No mass lesion, midline shift or mass effect. No hydrocephalus. No extra-axial fluid collection. Vascular: No hyperdense vessel. Scattered vascular calcifications noted within the carotid siphons. Skull: Scalp soft tissues demonstrate no acute finding. Calvarium intact. Sinuses/Orbits: Globes and orbital soft tissues within normal limits. Mild layering opacity within the right sphenoid sinus. Paranasal sinuses are otherwise clear. No mastoid effusion. Other: None. IMPRESSION: 1. No acute  intracranial abnormality. 2. Generalized age-related cerebral atrophy with moderate chronic small vessel ischemic disease. 3. Mild right sphenoid sinusitis. Electronically Signed   By: Rise Mu M.D.   On: 03/31/2019 02:00   Ct Head Wo Contrast  Result Date: 03/17/2019 CLINICAL DATA:  Explain confusion. EXAM: CT HEAD WITHOUT CONTRAST CT CERVICAL SPINE WITHOUT CONTRAST TECHNIQUE: Multidetector CT imaging of the head and cervical spine was performed following the standard protocol without intravenous contrast. Multiplanar CT image reconstructions of the cervical spine were also generated. COMPARISON:  Brain MRI 02/17/2019, cervical spine MRI 02/18/2019 FINDINGS: CT HEAD FINDINGS Brain: No evidence of acute infarction, hemorrhage, hydrocephalus, extra-axial collection or mass lesion/mass effect. Moderate brain parenchymal volume loss and deep white matter microangiopathy. Vascular: Calcific atherosclerotic disease of the intra cavernous carotid arteries. Skull: Normal. Negative for fracture or focal lesion. Sinuses/Orbits: No acute finding. Other: None CT CERVICAL SPINE FINDINGS Alignment: Normal. Skull base and vertebrae: No acute fracture. No primary bone lesion or focal pathologic process. Soft tissues and spinal canal: No prevertebral fluid or swelling. No visible canal hematoma. Disc levels:  Multilevel osteoarthritic changes. Upper chest: Negative. Other: IMPRESSION: 1. No acute intracranial abnormality. 2. Atrophy, chronic microvascular disease. 3. No evidence of acute traumatic injury to cervical spine. 4. Multilevel osteoarthritic changes of the cervical spine. Electronically Signed   By: Ted Mcalpine M.D.   On: 03/17/2019 13:28   Ct Cervical Spine Wo Contrast  Result Date: 03/17/2019 CLINICAL DATA:  Explain confusion. EXAM: CT HEAD WITHOUT CONTRAST CT CERVICAL SPINE WITHOUT CONTRAST TECHNIQUE: Multidetector CT imaging of the head and cervical spine was performed following the  standard protocol without intravenous contrast. Multiplanar CT image reconstructions of the cervical spine were also generated. COMPARISON:  Brain MRI 02/17/2019, cervical spine MRI 02/18/2019 FINDINGS: CT HEAD FINDINGS Brain: No evidence of acute infarction, hemorrhage, hydrocephalus, extra-axial collection or mass lesion/mass effect. Moderate brain parenchymal volume loss and deep white matter microangiopathy. Vascular: Calcific atherosclerotic disease of the intra cavernous carotid arteries. Skull: Normal. Negative for fracture or focal lesion. Sinuses/Orbits: No acute finding. Other: None CT CERVICAL SPINE FINDINGS Alignment: Normal. Skull base and vertebrae: No acute fracture. No primary bone lesion or focal pathologic process. Soft tissues and spinal canal: No prevertebral fluid or swelling. No visible canal hematoma. Disc levels:  Multilevel osteoarthritic changes. Upper chest: Negative. Other: IMPRESSION: 1. No acute intracranial abnormality. 2. Atrophy, chronic microvascular disease. 3. No evidence of acute traumatic injury to cervical spine. 4. Multilevel osteoarthritic changes of the cervical spine. Electronically Signed  By: Dobrinka  Dimitrova M.D.   On: 03/17/2019 13:28   Us Renal  Result Date: 04/04/2019 CLINICAL DATA:  Acute kidney injury EXAM: RENAL / URINARY TRACT ULTRASOUND COMPLETE COMPARISON:  05/19/2017 CT abdomen/pelvis. FINDINGS: Right Kidney: Renal measurements: 10.0 x 4.9 x 5.0 cm = volume: 129 mL. No hydronephrosis. Mildly echogenic and mildly atrophic right renal parenchyma. No right renal mass. Left Kidney: Renal measurements: 12.7 x 6.1 x 6.2 cm = volume: 251 mL. No hydronephrosis. Mildly echogenic renal parenchyma, normal in thickness. Simple appearing 2.2 x 1.9 x 1.9 cm interpolar left renal cyst. Bladder: Appears normal for degree of bladder distention. Ureteral jets are not visualized in the bladder on color Doppler. IMPRESSION: 1. No hydronephrosis. 2. Asymmetric right renal  atrophy. 3. Echogenic kidneys, indicative of nonspecific renal parenchymal disease of uncertain chronicity. 4. Simple appearing left renal cyst. 5. Normal bladder. Electronically Signed   By: Jason A Poff M.D.   On: 04/04/2019 12:20   Mr Hip Left Wo Contrast  Result Date: 03/19/2019 CLINICAL DATA:  Left hip pain and difficulty standing. Injury history is uncertain. EXAM: MR OF THE LEFT HIP WITHOUT CONTRAST TECHNIQUE: Multiplanar, multisequence MR imaging was performed. No intravenous contrast was administered. COMPARISON:  Plain films left hip 03/17/2019. FINDINGS: Bones: No fracture, dislocation or focal lesion. No worrisome lesion. Lower lumbar spondylosis with associated degenerative endplate signal change is noted. Articular cartilage and labrum Articular cartilage:  Appears preserved. Labrum:  The anterior, superior labrum is degenerated. Joint or bursal effusion Joint effusion:  None. Bursae: Negative. Muscles and tendons Muscles and tendons: The patient has a complete tear of the left iliopsoas tendon from the lesser trochanter. The tendon is retracted 3-4 cm. Edema is seen about the distal tendon and tracks into the muscle belly most consistent with acute or early subacute tear. There is also some edema in the left adductor magnus compatible with strain without tear. Tendinosis of the hamstrings at their origins is worse on the left. Partial tear of the left hamstrings off the ischial tuberosity measures 1.4 cm AP with little to no retraction. Other findings Miscellaneous: Imaged intrapelvic contents demonstrate no acute abnormality. IMPRESSION: Negative for fracture or dislocation. Complete tear of the left ileus psoas tendon from the lesser tuberosity with 3-4 cm of retraction appears acute or early subacute. Chronic appearing tendinosis and partial tear of the left hamstrings from their origin. Edema in the left adductor magnus compatible with strain without tear. Electronically Signed   By: Thomas   Dalessio M.D.   On: 03/19/2019 13:22   Dg Humerus Left  Result Date: 03/31/2019 CLINICAL DATA:  Initial evaluation for acute trauma, fall. EXAM: LEFT HUMERUS - 2+ VIEW COMPARISON:  None. FINDINGS: There is no evidence of fracture or other focal bone lesions. Mild degenerative changes noted about the shoulder and elbow. Soft tissues are unremarkable. IMPRESSION: No acute osseous abnormality about the left humerus. Electronically Signed   By: Benjamin  McClintock M.D.   On: 03/31/2019 02:06   Ct Angio Chest/abd/pel For Dissection W And/or W/wo  Result Date: 03/17/2019 CLINICAL DATA:  83 year old female complaining of back pain. Recent history of fall. EXAM: CT ANGIOGRAPHY CHEST, ABDOMEN AND PELVIS TECHNIQUE: Multidetector CT imaging through the chest, abdomen and pelvis was performed using the standard protocol during bolus administration of intravenous contrast. Multiplanar reconstructed images and MIPs were obtained and reviewed to evaluate the vascular anatomy. CONTRAST:  <[place] [facility] 350 MG/ML SOLN COMPARISON:  No prior chest CT.  CT abdomen and pelvis 05/19/2017.  FINDINGS: CTA CHEST FINDINGS Cardiovascular: Heart size is mildly enlarged. There is no significant pericardial fluid, thickening or pericardial calcification. There is aortic atherosclerosis, as well as atherosclerosis of the great vessels of the mediastinum and the coronary arteries, including calcified atherosclerotic plaque in the left main, left anterior descending, left circumflex and right coronary arteries. Status post median sternotomy for CABG including LIMA to the LAD. On precontrast images there is no crescentic high attenuation associated with the wall of the thoracic aorta to suggest acute intramural hemorrhage. No aortic aneurysm or dissection. Calcifications of the aortic valve and mitral annulus. Mediastinum/Nodes: No pathologically enlarged mediastinal or hilar lymph nodes. Esophagus is normal in appearance. No  axillary lymphadenopathy. Lungs/Pleura: Scattered areas of linear scarring are noted in the lungs bilaterally, most evident near the lung apices and in the lung bases. Areas of mild dependent subsegmental atelectasis also noted in the lower lobes of the lungs bilaterally. No acute consolidative airspace disease. No pleural effusions. No definite suspicious appearing pulmonary nodules or masses are noted. Musculoskeletal: Median sternotomy wires. There are no aggressive appearing lytic or blastic lesions noted in the visualized portions of the skeleton. Review of the MIP images confirms the above findings. CTA ABDOMEN AND PELVIS FINDINGS VASCULAR Aorta: Normal caliber aorta without aneurysm, dissection, vasculitis or significant stenosis. Celiac: Patent without evidence of aneurysm, dissection, vasculitis or significant stenosis. SMA: Patent without evidence of aneurysm, dissection, vasculitis or significant stenosis. Renals: Both renal arteries are patent without evidence of aneurysm, dissection, vasculitis, fibromuscular dysplasia or significant stenosis. IMA: Patent without evidence of aneurysm, dissection, vasculitis or significant stenosis. Inflow: Patent without evidence of aneurysm, dissection, vasculitis or significant stenosis. Veins: No obvious venous abnormality within the limitations of this arterial phase study. Review of the MIP images confirms the above findings. NON-VASCULAR Hepatobiliary: No suspicious cystic or solid hepatic lesions. Status post cholecystectomy. No intrahepatic biliary ductal dilatation. Common bile duct measures 7 mm in the porta hepatis, within normal limits for this post cholecystectomy patient. Pancreas: No pancreatic mass. No pancreatic ductal dilatation. No pancreatic or peripancreatic fluid or inflammatory changes. Spleen: Unremarkable. Adrenals/Urinary Tract: Low-attenuation lesions in both kidneys, compatible with simple cysts, largest of which measures 2.1 cm in the upper  pole the left kidney. Multifocal cortical thinning in the right kidney, presumably chronic scarring. No definite suspicious renal lesions. No hydroureteronephrosis. Urinary bladder is normal in appearance. Bilateral adrenal glands are normal in appearance. Stomach/Bowel: Normal appearance of the stomach. No pathologic dilatation of small bowel or colon. The appendix is not confidently identified and may be surgically absent. Regardless, there are no inflammatory changes noted adjacent to the cecum to suggest the presence of an acute appendicitis at this time. Lymphatic: No lymphadenopathy noted in the abdomen or pelvis. Reproductive: Status post hysterectomy. Ovaries are not confidently identified may be surgically absent or trophic. Other: No significant volume of ascites.  No pneumoperitoneum. Musculoskeletal: Median sternotomy wires. No acute displaced fractures or aggressive appearing lytic or blastic lesions are noted in the visualized portions of the skeleton. Review of the MIP images confirms the above findings. IMPRESSION: 1. No acute findings are noted in the chest, abdomen or pelvis to account for the patient's symptoms. Specifically, no evidence of acute aortic syndrome. 2. Mild cardiomegaly. 3. Aortic atherosclerosis, in addition to left main and 3 vessel coronary artery disease. Status post median sternotomy for CABG including LIMA to the LAD. 4. There are calcifications of the aortic valve and mitral annulus. Echocardiographic correlation for evaluation of potential valvular  dysfunction may be warranted if clinically indicated. 5. Additional incidental findings, as above. Aortic Atherosclerosis (ICD10-I70.0). Electronically Signed   By: Trudie Reed M.D.   On: 03/17/2019 15:50   Dg Femur Min 2 Views Left  Result Date: 03/31/2019 CLINICAL DATA:  Initial evaluation for acute trauma, fall. EXAM: LEFT FEMUR 2 VIEWS COMPARISON:  None. FINDINGS: No acute fracture or dislocation. Osseous mineralization  normal. Mild osteoarthritic changes about the hip and knee. No soft tissue abnormality. Prominent atherosclerotic change noted. IMPRESSION: No acute osseous abnormality about the left femur. Electronically Signed   By: Rise Mu M.D.   On: 03/31/2019 01:55   Dg Hips Bilat W Or Wo Pelvis 5 Views  Result Date: 03/17/2019 CLINICAL DATA:  Bilateral hip pain EXAM: DG HIP (WITH OR WITHOUT PELVIS) 5+V BILAT COMPARISON:  None. FINDINGS: There is no evidence of hip fracture or dislocation. There is no evidence of arthropathy or other focal bone abnormality. Peripheral vascular atherosclerotic disease. IMPRESSION: No acute osseous injury of the bilateral hips. Electronically Signed   By: Elige Ko   On: 03/17/2019 13:34    Catarina Hartshorn, DO  [facility] Pager [phone]  If 7PM-7AM, please contact night-coverage [email or web address] Password [email or web address] 04/05/2019, 10:58 AM   LOS: 1 day

## 2019-04-05 NOTE — Progress Notes (Signed)
Spoke with patient's HPOA (great grandson), Delton See -he is agreeable to palliative consult -goals of care and advance care directives discussed -he confirms DNR -very receptive to possible transition to residential hospice  DTat

## 2019-04-06 DIAGNOSIS — R0902 Hypoxemia: Secondary | ICD-10-CM

## 2019-04-06 DIAGNOSIS — Z515 Encounter for palliative care: Secondary | ICD-10-CM

## 2019-04-06 DIAGNOSIS — N390 Urinary tract infection, site not specified: Secondary | ICD-10-CM

## 2019-04-06 LAB — CBC
HCT: 39.3 % (ref 36.0–46.0)
Hemoglobin: 11.2 g/dL — ABNORMAL LOW (ref 12.0–15.0)
MCH: 24.7 pg — ABNORMAL LOW (ref 26.0–34.0)
MCHC: 28.5 g/dL — ABNORMAL LOW (ref 30.0–36.0)
MCV: 86.6 fL (ref 80.0–100.0)
Platelets: 248 10*3/uL (ref 150–400)
RBC: 4.54 MIL/uL (ref 3.87–5.11)
RDW: 16.7 % — ABNORMAL HIGH (ref 11.5–15.5)
WBC: 7.9 10*3/uL (ref 4.0–10.5)
nRBC: 0 % (ref 0.0–0.2)

## 2019-04-06 LAB — GLUCOSE, CAPILLARY
Glucose-Capillary: 108 mg/dL — ABNORMAL HIGH (ref 70–99)
Glucose-Capillary: 214 mg/dL — ABNORMAL HIGH (ref 70–99)
Glucose-Capillary: 91 mg/dL (ref 70–99)
Glucose-Capillary: 115 mg/dL — ABNORMAL HIGH (ref 70–99)

## 2019-04-06 LAB — BASIC METABOLIC PANEL
Anion gap: 11 (ref 5–15)
BUN: 26 mg/dL — ABNORMAL HIGH (ref 8–23)
CO2: 21 mmol/L — ABNORMAL LOW (ref 22–32)
Calcium: 8.7 mg/dL — ABNORMAL LOW (ref 8.9–10.3)
Chloride: 111 mmol/L (ref 98–111)
Creatinine, Ser: 3.56 mg/dL — ABNORMAL HIGH (ref 0.44–1.00)
GFR calc Af Amer: 13 mL/min — ABNORMAL LOW (ref >60)
GFR calc non Af Amer: 11 mL/min — ABNORMAL LOW (ref >60)
Glucose, Bld: 126 mg/dL — ABNORMAL HIGH (ref 70–99)
Potassium: 4.7 mmol/L (ref 3.5–5.1)
Sodium: 143 mmol/L (ref 135–145)

## 2019-04-06 LAB — SARS CORONAVIRUS 2 BY RT PCR (HOSPITAL ORDER, PERFORMED IN ~~LOC~~ HOSPITAL LAB): SARS Coronavirus 2: NEGATIVE

## 2019-04-06 LAB — CK: Total CK: 88 U/L (ref 38–234)

## 2019-04-06 MED ORDER — GABAPENTIN 300 MG PO CAPS: 300.0000 mg | ORAL_CAPSULE | Freq: Two times a day (BID) | ORAL | Status: DC

## 2019-04-06 MED ORDER — SODIUM CHLORIDE 0.9 % IV SOLN: 1000.0000 mL | INTRAVENOUS | Status: DC

## 2019-04-06 MED ORDER — OXYCODONE HCL 5 MG PO TABS
5.0000 mg | ORAL_TABLET | Freq: Four times a day (QID) | ORAL | Status: DC | PRN
  Administered 2019-04-08 – 2019-04-10 (×5): 5 mg via ORAL
  Filled 2019-04-06 (×5): qty 1

## 2019-04-06 MED ORDER — SODIUM CHLORIDE 0.9 % IV SOLN: 1.0000 g | INTRAVENOUS | Status: DC

## 2019-04-06 MED ORDER — IPRATROPIUM-ALBUTEROL 20-100 MCG/ACT IN AERS
1.0000 | INHALATION_SPRAY | Freq: Four times a day (QID) | RESPIRATORY_TRACT | Status: DC | PRN
  Filled 2019-04-06: qty 4

## 2019-04-06 MED ORDER — LORAZEPAM 0.5 MG PO TABS
0.5000 mg | ORAL_TABLET | Freq: Four times a day (QID) | ORAL | Status: DC | PRN
  Administered 2019-04-08 (×2): 0.5 mg via ORAL
  Filled 2019-04-06 (×2): qty 1

## 2019-04-06 NOTE — Progress Notes (Signed)
Physical Therapy Treatment Patient Details Name: [name] AuerRuby J Cauthon MRN: [id number] DOB: [birth year] Today's Date: 04/06/2019    History of Present Illness [name] [name] is a 83 y.o. female with medical history of diabetes mellitus type 2, chronic diastolic CHF, stroke, COPD, hypothyroidism, hypertension, coronary disease, hyperlipidemia, OSA presenting with altered mental status.  The patient has had numerous hospitalizations with similar presentation.  Most recently, the patient was discharged from the hospital after a stay from 03/17/2019 through 03/20/2019 for acute metabolic encephalopathy that was multifactorial including UTI and hypercarbia.  In addition, the patient had another admission from 02/22/2019 through 02/26/2019 for encephalopathy secondary to hypoglycemia and UTI.  Since her discharge from the hospital on 03/20/2019, the patient was discharged home to live with her daughter.  The patient had refused to go to a skilled nursing facility at that time.  Initially, the patient was able to stand and take a few steps with a walker.  However over the past week, the patient's family has noted increasing generalized weakness, decreased oral intake, and more somnolence.  On 03/29/2019, the patient was complaining of some dysuria.  Her home health nurse called the patient's PCP.  The patient was prescribed ciprofloxacin.  Since that date, the patient has had increasing somnolence which was significantly worse on 04/03/2019.  During this past week, the patient has had significantly decreased oral intake.  According to the patient's family, the patient was somnolent and slept most of the day.  When she woke up on 04/04/2019, the patient complained of chest discomfort.  As result, EMS was activated.  During this past week, the patient has not had any recurrent discomfort or complained of any chronic worsening cough or shortness of breath.  The patient did have a low-grade temperature of 100.2 F on 04/03/2019.  There  is been no complaints of headaches, nausea, vomiting, diarrhea, abdominal pain.  According to the patient's daughter, they have been compliant with social distancing in the context of the coronavirus pandemic.  There is been no sick contacts.  Other than the ciprofloxacin, there has not been any new medications.    PT Comments    Pt laying sideways and midways down the bed, very confused today with inability to state name, DOB or aware of current location.  Pt also limited by LBP 9/10 that was monitored through session.  Pt required frequent one step cueing to assist with bed mobility with tactile and verbal cueing to complete.  Mod A with sit to stand and difficulty following commands for hand placement to assist with sit to stand as well as reach for RW upon standing.  Pt able to make 4 small steps to chair with max A for safety.  EOS pt left in chair with call bell within reach, chair alarm set and RN in room to replace pure wic.      Follow Up Recommendations  SNF;Supervision/Assistance - 24 hour;Supervision for mobility/OOB     Equipment Recommendations  None recommended by PT    Recommendations for Other Services       Precautions / Restrictions Precautions Precautions: Fall Precaution Comments: pt c/o L knee pain and per chart review, multiple falls in last few weeks    Mobility  Bed Mobility Overal bed mobility: Needs Assistance Bed Mobility: Supine to Sit     Supine to sit: Mod assist;Max assist     General bed mobility comments: slow labored movement, frequent cueing for hand/trunk placement to assist wiht bed mobility  Transfers Overall  transfer level: Needs assistance Equipment used: Rolling walker (2 wheeled) Transfers: Stand Pivot Transfers Sit to Stand: Mod assist Stand pivot transfers: Mod assist       General transfer comment: very unsteady on feet with poor coordination of legs, difficulty following cues for hand placement to assist with standing as well as  use of RW to assist with transfer to chair  Ambulation/Gait Ambulation/Gait assistance: Max assist Gait Distance (Feet): 4 Feet Assistive device: Rolling walker (2 wheeled) Gait Pattern/deviations: Decreased step length - right;Decreased step length - left;Decreased stride length;Shuffle Gait velocity: slow   General Gait Details: limited to 5-6 short unsteady steps with occasional buckling of knees due to weakness, demonstrates poor coordination of legs and requires repeated verbal/tactile cueing for safety   Stairs             Wheelchair Mobility    Modified Rankin (Stroke Patients Only)       Balance                                            Cognition Arousal/Alertness: Awake/alert Behavior During Therapy: Flat affect;Impulsive Overall Cognitive Status: Impaired/Different from baseline Area of Impairment: Attention;Following commands                   Current Attention Level: Selective   Following Commands: Follows one step commands with increased time       General Comments: very confused, unable to state current status, would not respond to name/DOB questions      Exercises      General Comments        Pertinent Vitals/Pain Pain Assessment: 0-10 Pain Score: 9  Pain Location: back and rib pain Pain Descriptors / Indicators: Sore;Discomfort;Guarding;Grimacing Pain Intervention(s): Limited activity within patient's tolerance;Monitored during session;Repositioned    Home Living                      Prior Function            PT Goals (current goals can now be found in the care plan section)      Frequency    Min 3X/week      PT Plan Current plan remains appropriate    Co-evaluation              AM-PAC PT "6 Clicks" Mobility   Outcome Measure  Help needed turning from your back to your side while in a flat bed without using bedrails?: A Lot Help needed moving from lying on your back to  sitting on the side of a flat bed without using bedrails?: A Lot Help needed moving to and from a bed to a chair (including a wheelchair)?: A Lot Help needed standing up from a chair using your arms (e.g., wheelchair or bedside chair)?: A Lot Help needed to walk in hospital room?: A Lot Help needed climbing 3-5 steps with a railing? : Total 6 Click Score: 11    End of Session Equipment Utilized During Treatment: Gait belt;Oxygen(O2 removed in bed wiht O2 sat at 78%, returned to 93% with L2 O2 A with nasal canal) Activity Tolerance: Patient tolerated treatment well;Patient limited by fatigue Patient left: in chair;with call bell/phone within reach;with chair alarm set;with nursing/sitter in room Nurse Communication: Mobility status PT Visit Diagnosis: Unsteadiness on feet (R26.81);Other abnormalities of gait and mobility (R26.89);Muscle weakness (generalized) (M62.81) Pain -  Right/Left: Left Pain - part of body: Knee;Leg     Time: 0762-2633 PT Time Calculation (min) (ACUTE ONLY): 40 min  Charges:  $Therapeutic Activity: 38-52 mins                     [address] LPTA; CBIS [phone]   Juel Burrow 04/06/2019, 10:41 AM

## 2019-04-06 NOTE — Clinical Social Work Note (Signed)
Patient Information   Patient Name [name] [name], [name] ([id number]) Sex Female DOB 1936-01-06  Room Bed  A329 A329-01  Patient Demographics   Address [address] [place] [address] Phone [phone] (Home)  Patient Ethnicity & Race   Ethnic Group Patient Race  Not Hispanic or Latino White or Caucasian  Emergency Contact(s)   Name Relation Home Work Mobile  UgashikBrammer,Shirley Daughter   [phone]  Meredith Pelurgason, Tyler Grandson   [phone]  ?, Valere DrossLibby Granddaughter   [phone]  Vladimir Croftsatterson,Michael Son [phone]    Meschaux,Pam Daughter [phone]    lovelace,samuel Lucila MaineGrandson   [phone]  Kerry KassLowdermilk, Amy Granddaughter   [phone]  Documents on File    Status Date Received Description  Documents for the Patient  EMR Medication Summary Not Received    EMR Problem Summary Not Received    EMR Patient Summary Not Received    [place] HIPAA NOTICE OF PRIVACY - Scanned Received 02/07/11   [place] E-Signature HIPAA Notice of Privacy Signed 06/09/12   [place] E-Signature HIPAA Notice of Privacy Spanish Not Received    Driver's License Not Received    Advance Directives/Living Will/HCPOA/POA Not Received    Driver's License Not Received    Driver's License Not Received    Insurance Card Received 04/20/16 [facility]  Insurance Card Received 03/14/11   Insurance Card Not Received    [place] HIPAA NOTICE OF PRIVACY - Scanned Not Received    Financial Application Not Received    Insurance Card Not Received    Insurance Card Received 07/22/14   [place] E-Signature HIPAA Notice of Privacy Received 08/09/14   Insurance Card Received 06/26/15   Other Photo ID Not Received    HIM ROI Authorization  07/27/15 [facility]--07/26/15 TCS op note to PCP (mcinnis)  HIM ROI Authorization  [facility]  [place] E-Signature HIPAA Notice of Privacy Signed 09/28/17   HIM ROI Authorization  10/31/17   HIM ROI Authorization Received 12/22/18 Lanetta InchAETNA  Orvetta Danielski [place]  E-Signature HIPAA Notice of Privacy Signed 01/13/19   HIM ROI Authorization  02/03/19 pt acct  [place] E-Signature HIPAA Notice of Privacy Signed 02/08/19   Release of Information Not Received    Advanced Beneficiary Notice (ABN) Not Received    E-Signature AOB Spanish Not Received    HIM Release of Information Output  10/31/17 Entire Encounter  HIM ROI Authorization Received (Deleted) 12/22/18   HIM Release of Information Output (Deleted) 12/27/18 Requested records  HIM Release of Information Output (Deleted) 02/03/19 Requested records  HIM Release of Information Output (Deleted) 03/26/19 Requested records  Documents for the Encounter  AOB (Assignment of Insurance Benefits) Received 04/04/19 Verbal Consent to Treat (AOB)  E-signature AOB     MEDICARE RIGHTS Received 04/04/19 Verbal Consent to Treat (Medicare Rights)  E-signature Medicare Rights     EMS Run Sheet Received 04/04/19   Ultrasound Received 04/04/19   ED Patient Billing Extract   ED PB Billing Extract  Cardiac Monitoring Strip Received 04/04/19   Cardiac Monitoring Strip Shift Summary Received 04/04/19   Photos     Photos     HIM Release of Information Output   [id number]  Admission Information   Current Information   Attending Provider Admitting Provider Admission Type Admission Status  Tat, Onalee Huaavid, MD Catarina Hartshornat, David, MD Emergency Admission (Confirmed)       Admission Date/Time Discharge Date Hospital Service Auth/Cert Status  04/04/19 09:21 AM  Internal Medicine Incomplete       Hospital Area Unit  Room/Bed   [facility] AP-DEPT 300 A329/A329-01        Admission   Complaint  altered mental status  Hospital Account   Name Acct ID Class Status Primary Coverage  Maiesha, [place] [id number] Inpatient Open AETNA MEDICARE - AETNA MEDICARE HMO/PPO      Guarantor Account (for Hospital Account [id number])   Name Relation to Pt Service Area Active? Acct Type  Coor, [name] Self  CHSA Yes Personal/Family  Address Phone    [address] [place], [place] [address] [phone](H)        Coverage Information (for Hospital Account [id number])   F/O Payor/Plan Precert #  AETNA MEDICARE/AETNA MEDICARE HMO/PPO   Subscriber Subscriber #  Cathay, Lipe [facility]  Address Phone  [address] [place], [place] [address] [phone]       Care Everywhere ID:  [id number]

## 2019-04-06 NOTE — Progress Notes (Signed)
PROGRESS NOTE  [name] [name] ZOX:[id number] DOB: April 03, 1936 DOA: 04/04/2019 PCP: Oval Linsey, MD   Brief History:  83 y.o.femalewith medical history ofdiabetes mellitus type 2, chronic diastolic CHF, stroke, COPD, hypothyroidism, hypertension, coronary disease, hyperlipidemia, OSA presenting with altered mental status. The patient has had numerous hospitalizations with similar presentation. Most recently, the patient was discharged from the hospital after a stay from 03/17/2019 through 03/20/2019 for acute metabolic encephalopathy that was multifactorial including UTI and hypercarbia. In addition, the patient had another admission from 02/22/2019 through 02/26/2019 for encephalopathy secondary to hypoglycemia and UTI. Since her discharge from the hospital on 03/20/2019, the patient was discharged home to live with her daughter. The patient had refused to go to a skilled nursing facility at that time. Initially, the patient was able to stand and take a few steps with a walker. However over the past week, the patient's family has noted increasing generalized weakness, decreased oral intake, and more somnolence. On 03/29/2019, the patient was complaining of some dysuria. Her home health nurse called the patient's PCP. The patient was prescribed ciprofloxacin. Since that date, the patient has had increasing somnolence which was significantly worse on 04/03/2019. During this past week, the patient has had significantly decreased oral intake. According to the patient's family, the patient was somnolent and slept most of the day. When she woke up on 04/04/2019, the patient complained of chest discomfort. As result, EMS was activated. During this past week, the patient has not had any recurrent discomfort or complained of any chronic worsening cough or shortness of breath. The patient did have a low-grade temperature of 100.2 F on 04/03/2019. There is been no complaints of headaches,  nausea, vomiting, diarrhea, abdominal pain. According to the patient's daughter, they have been compliant with social distancing in the context of the coronavirus pandemic. There is been no sick contacts. Other than the ciprofloxacin, there has not been any new medications. In the emergency department, the patient had a low-grade temperature of 99.7 F. She was hemodynamically stable. Oxygen saturations were 92--95% on room air. BMP showed a serum creatinine of 3.48 which was significantly above her usual baseline. WBC was 10.2 with hemoglobin 11.3 and platelets 270,000. Chest x-ray showed poor inspiration withoutconsolidation or edema. Renal ultrasound was negative for hydronephrosis but showed right renal atrophy. CT of the brain was negative for acute findings. Lactic acid was 1.8. Urinalysis showed 21-50 WBC. The patient was given a liter bolus of normal saline and started on ceftriaxone.  Assessment/Plan: Acute metabolic encephalopathy -Secondary to acute kidney injury and UTI, ciprofloxacin as well as other hypnotic medications including oxycodone/gabapentin which have been discontinued -At baseline, the patient has poor functional status requiring assistance with all aspects of activities of daily living -She has been essentially bedbound for the past week -pt is more awake, but falls asleep without any stimulation and remains somnolent in general -At baseline, the patient is alert and oriented x3 per family -Serum B12--724 -Folic acid--20.6 -ammonia < 9 -03/18/2019 TSH 0.558 -ABG--see below -pt remains somnolent, but arousable and pleasantly confused  Acute kidney injury -Secondary to volume depletion and hemodynamic changes (low BPs) -baseline creatinine 0.7-0.8 -serum creatinine peaked 3.64 -continue IV fluids -Renal ultrasound negative for hydronephrosis -Discontinue lisinopril  Pyuria -Concerning for UTI -Continue ceftriaxone D#3 despite neg culture as pt  received cipro prior to admission which likely compromised culture  Hypoxia/Acute respiratory failure with hypoxia -due to OSA/OHS and hypoventilation -ABG--7.305/47/82/22 on 2L -  oxygen saturation 92-94% on RA; desaturates during sleep -check D-dimer--0.95 -V/Q scan neg  Essential hypertension -Holding lisinopril in the setting of acute kidney injury -Holding amlodipine secondary to soft blood pressure initially  Diabetes mellitus type 2, uncontrolled with hyperglycemia -As the patient has decreased oral intake, hold 70/30 insulin -NovoLog sliding scale -02/18/2019 hemoglobin A1c 8.5 -now hypoglycemic due to poor po intake  Failure to thrive in adult -The patient's daughter relates that functional and cognitive decline over the past 4 to 5 months -Goals of care discussed--patient is DNR -PT evaluation--24 hour care -Serum B12--724 -Folic acid--20.6 -ammonia < 9 -03/18/2019 TSH 0.558 -patient continues to have poor po intake resulting in hypoglycemia  Hyperlipidemia -Continue statin  Hypothyroidism -Continue levothyroxine  History of multiple strokes -Continue aspirin and Plavix secondary to severe right PCA stenosis -This was recommended by neurology during consultation in February 2020  Goals of Care -Advance care planning, including the explanation and discussion of advance directives was carried out with the patient and family. Code status including explanations of "Full Code" and "DNR" and alternatives were discussed in detail. Discussion of end-of-life issues including but not limited palliative care, hospice care and the concept of hospice, other end-of-life care options, power of attorney for health care decisions, living wills, and physician orders for life-sustaining treatment were also discussed with the patient and family. Total face to face time 16 minutes. -DNR -consult palliative medicine--This is 4th hospitalization in past 2 months and 8  hospitalizations in past 12 months family endorses continued cognitive and functional decline over past 4-5 months -long discussion with HPOA -palliative medicine conference 4/14  Stage 2 sacral decubitus -present prior to admission -consult wound care -see pictures below on 4/13 note    Disposition Plan:   SNF vs residential hospice Family Communication:  No Family at bedside  Consultants:  Palliative medicine  Code Status:  DNR  DVT Prophylaxis:  [place] Heparin   Procedures: As Listed in Progress Note Above  Antibiotics: None    Subjective: Pt is pleasantly confused.  He is somnolent most of day, but arouses to voice.  She denies f/c, headache, cp, sob, abd pain.  Remainder ROS unobtainable due to encephalopathy  Objective: Vitals:   04/05/19 2130 04/05/19 2217 04/06/19 0506 04/06/19 0540  BP: (!) 142/69   125/66  Pulse: 87   77  Resp: 18   18  Temp: 98.9 F (37.2 C)   98.7 F (37.1 C)  TempSrc: Oral   Oral  SpO2: (!) 81% 92%  99%  Weight:   89.7 kg   Height:        Intake/Output Summary (Last 24 hours) at 04/06/2019 1309 Last data filed at 04/06/2019 0620 Gross per 24 hour  Intake 1882.73 ml  Output 950 ml  Net 932.73 ml   Weight change: -1.3 kg Exam:   General:  Pt is alert, follows commands appropriately, not in acute distress  HEENT: No icterus, No thrush, No neck mass, [place]/AT  Cardiovascular: RRR, S1/S2, no rubs, no gallops  Respiratory: bibasilar crackles, no wheeze  Abdomen: Soft/+BS, non tender, non distended, no guarding  Extremities: 1+LE edema, No lymphangitis, No petechiae, No rashes, no synovitis   Data Reviewed: I have personally reviewed following labs and imaging studies Basic Metabolic Panel: Recent Labs  Lab 04/04/19 0942 04/05/19 0458 04/06/19 0528  NA 139 141 143  K 4.8 4.5 4.7  CL 103 109 111  CO2 25 22 21*  GLUCOSE 146* 118* 126*  BUN 26* 26*  26*  CREATININE 3.48* 3.64* 3.56*  CALCIUM 9.1 8.2* 8.7*  MG   --  1.9  --    Liver Function Tests: Recent Labs  Lab 04/04/19 0942  AST 19  ALT 13  ALKPHOS 53  BILITOT 1.0  PROT 6.3*  ALBUMIN 3.5   No results for input(s): LIPASE, AMYLASE in the last 168 hours. Recent Labs  Lab 04/05/19 1214  AMMONIA <9*   Coagulation Profile: Recent Labs  Lab 04/04/19 0942  INR 1.2   CBC: Recent Labs  Lab 04/04/19 0942 04/05/19 0458 04/06/19 0528  WBC 10.2 7.7 7.9  NEUTROABS 7.3  --   --   HGB 11.3* 9.5* 11.2*  HCT 38.1 33.3* 39.3  MCV 84.9 86.5 86.6  PLT 270 244 248   Cardiac Enzymes: Recent Labs  Lab 04/04/19 0944 04/04/19 1529 04/04/19 2228 04/06/19 0528  CKTOTAL  --  98  --  88  TROPONINI <0.03 <0.03 <0.03  --    BNP: Invalid input(s): POCBNP CBG: Recent Labs  Lab 04/05/19 2212 04/05/19 2243 04/05/19 2321 04/06/19 0735 04/06/19 1109  GLUCAP 56* 73 88 108* 214*   HbA1C: No results for input(s): HGBA1C in the last 72 hours. Urine analysis:    Component Value Date/Time   COLORURINE YELLOW 04/04/2019 0932   APPEARANCEUR HAZY (A) 04/04/2019 0932   LABSPEC 1.006 04/04/2019 0932   PHURINE 5.0 04/04/2019 0932   GLUCOSEU NEGATIVE 04/04/2019 0932   HGBUR NEGATIVE 04/04/2019 0932   BILIRUBINUR NEGATIVE 04/04/2019 0932   KETONESUR NEGATIVE 04/04/2019 0932   PROTEINUR NEGATIVE 04/04/2019 0932   UROBILINOGEN 0.2 08/29/2015 1842   NITRITE NEGATIVE 04/04/2019 0932   LEUKOCYTESUR MODERATE (A) 04/04/2019 0932   Sepsis Labs: @LABRCNTIP (procalcitonin:4,lacticidven:4) ) Recent Results (from the past 240 hour(s))  Culture, blood (Routine x 2)     Status: None (Preliminary result)   Collection Time: 04/04/19  9:32 AM  Result Value Ref Range Status   Specimen Description   Final    BLOOD LEFT HAND Blood Culture results may not be optimal due to an inadequate volume of blood received in culture bottles BOTTLES DRAWN AEROBIC AND ANAEROBIC   Special Requests NONE  Final   Culture   Final    NO GROWTH 2 DAYS Performed at [facility], [address] [place], [place] [address]    Report Status PENDING  Incomplete  Urine Culture     Status: None   Collection Time: 04/04/19  9:32 AM  Result Value Ref Range Status   Specimen Description   Final    URINE, RANDOM Performed at [facility], [address], [place], [place] [address]    Special Requests   Final    NONE Performed at [facility], [address], [place], [place] [address]    Culture   Final    NO GROWTH Performed at [facility], [address], [place], [place] [address]    Report Status 04/05/2019 FINAL  Final  Culture, blood (Routine x 2)     Status: None (Preliminary result)   Collection Time: 04/04/19  9:37 AM  Result Value Ref Range Status   Specimen Description   Final    BLOOD RIGHT ANTECUBITAL Blood Culture results may not be optimal due to an excessive volume of blood received in culture bottles BOTTLES DRAWN AEROBIC AND ANAEROBIC   Special Requests NONE  Final   Culture   Final    NO GROWTH 2 DAYS Performed at [facility], [address] [place], [place] [address]    Report Status PENDING  Incomplete     Scheduled Meds:  aspirin EC  81 mg Oral Daily   citalopram  20 mg Oral Daily   clopidogrel  75 mg Oral Daily   heparin  5,000 Units Subcutaneous Q8H   insulin aspart  0-5 Units Subcutaneous QHS   insulin aspart  0-9 Units Subcutaneous TID WC   levothyroxine  25 mcg Oral Q breakfast   pantoprazole  40 mg Oral Q0600   simvastatin  20 mg Oral q1800   Continuous Infusions:  sodium chloride 125 mL/hr at 04/06/19 0620   [START ON 04/07/2019] cefTRIAXone (ROCEPHIN)  IV      Procedures/Studies: Dg Chest 1 View  Result Date: 04/05/2019 CLINICAL DATA:  83 year old female with a history of hypoxia EXAM: CHEST  1 VIEW COMPARISON:  04/04/2019, 03/17/2019, CT chest 03/17/2019 FINDINGS: Cardiomediastinal silhouette unchanged in size and contour with surgical changes of median sternotomy and CABG. Low lung volumes.  Interlobular septal thickening with coarsened interstitial markings. Similar appearance of linear opacity on the right and linear opacities at the left lung base. Blunting of the costophrenic angle bilaterally with meniscus on the right. IMPRESSION: Similar appearance of low lung volumes with atelectasis and evidence of edema. Small right pleural effusion and associated atelectasis/consolidation. Surgical changes of median sternotomy and CABG. Electronically Signed   By: Gilmer Mor D.O.   On: 04/05/2019 17:17   Dg Chest 1 View  Result Date: 03/17/2019 CLINICAL DATA:  Altered mental status. EXAM: CHEST  1 VIEW COMPARISON:  02/12/2019 FINDINGS: Postsurgical changes from CABG. The cardiac silhouette is enlarged. There is an apparent widening of the mediastinum, when compared to patient's prior radiographs. There is no evidence of focal airspace consolidation, or pneumothorax. Possible right pleural effusion. Osseous structures are without acute abnormality. Soft tissues are grossly normal. IMPRESSION: 1. Apparent widening of the mediastinum, when compared to patient's prior radiographs. This may be due to poor inspiratory effort, however vascular anomaly can not be excluded. Please correlate to patient's symptomatology. If the patient displays signs of acute aortic syndrome, then CT angiogram of the chest should be performed. Otherwise PA and lateral radiograph the chest may be considered. 2. Possible right pleural effusion. These results were called by telephone at the time of interpretation on 03/17/2019 at 1:43 pm to Dr. Samuel Jester , who verbally acknowledged these results. Electronically Signed   By: Ted Mcalpine M.D.   On: 03/17/2019 13:45   Dg Chest 2 View  Result Date: 04/04/2019 CLINICAL DATA:  83 year old with acute mental status changes that began yesterday. Patient is currently undergoing treatment for a urinary tract infection. Acute chest pain that began earlier today. EXAM: CHEST -  2 VIEW COMPARISON:  CT chest and chest x-ray 03/17/2019 and earlier. FINDINGS: AP ERECT and LATERAL images were obtained. Markedly suboptimal inspiration. Prior sternotomy for CABG. Cardiac silhouette mildly enlarged, unchanged. Pulmonary vascularity normal without evidence of pulmonary edema. Linear scarring scattered throughout both lungs, unchanged. Lungs otherwise clear. No visible pleural effusions. Prior ORIF of a LEFT humerus fracture. IMPRESSION: Markedly suboptimal inspiration. Stable mild cardiomegaly. No acute cardiopulmonary disease. Electronically Signed   By: Hulan Saas M.D.   On: 04/04/2019 11:11   Dg Pelvis 1-2 Views  Result Date: 03/31/2019 CLINICAL DATA:  Fall 2 days ago with left pelvis/hip and femur pain. Recent fall leading to iliopsoas tendon tear. EXAM: PELVIS - 1-2 VIEW COMPARISON:  Pelvis and hip radiographs 03/17/2019. Pelvic MRI 03/19/2019 FINDINGS:  The cortical margins of the bony pelvis are intact. No fracture. Pubic symphysis and sacroiliac joints are congruent. Both femoral heads are well-seated in the respective acetabula. There are vascular calcifications. IMPRESSION: No evidence of pelvic fracture. Electronically Signed   By: Narda RutherfordMelanie  Sanford M.D.   On: 03/31/2019 02:06   Ct Head Wo Contrast  Result Date: 04/04/2019 CLINICAL DATA:  Altered level of consciousness. EXAM: CT HEAD WITHOUT CONTRAST TECHNIQUE: Contiguous axial images were obtained from the base of the skull through the vertex without intravenous contrast. COMPARISON:  CT head 03/31/2019 FINDINGS: Brain: Atrophy and chronic microvascular ischemic changes are stable. No new findings. No acute infarct, hemorrhage, or mass. Vascular: Negative for hyperdense vessel Skull: Negative Sinuses/Orbits: Small air-fluid level posterior ethmoid sinus on the right. Remaining sinuses clear. Bilateral cataract surgery. Other: None IMPRESSION: No acute abnormality and no change from the recent study. Electronically Signed    By: Marlan Palauharles  Clark M.D.   On: 04/04/2019 10:57   Ct Head Wo Contrast  Result Date: 03/31/2019 CLINICAL DATA:  Initial evaluation for acute trauma, recent fall. EXAM: CT HEAD WITHOUT CONTRAST TECHNIQUE: Contiguous axial images were obtained from the base of the skull through the vertex without intravenous contrast. COMPARISON:  Prior CT from 03/17/2019. FINDINGS: Brain: Generalized age-related cerebral atrophy with moderate chronic microvascular ischemic disease. No acute intracranial hemorrhage. No acute large vessel territory infarct. No mass lesion, midline shift or mass effect. No hydrocephalus. No extra-axial fluid collection. Vascular: No hyperdense vessel. Scattered vascular calcifications noted within the carotid siphons. Skull: Scalp soft tissues demonstrate no acute finding. Calvarium intact. Sinuses/Orbits: Globes and orbital soft tissues within normal limits. Mild layering opacity within the right sphenoid sinus. Paranasal sinuses are otherwise clear. No mastoid effusion. Other: None. IMPRESSION: 1. No acute intracranial abnormality. 2. Generalized age-related cerebral atrophy with moderate chronic small vessel ischemic disease. 3. Mild right sphenoid sinusitis. Electronically Signed   By: Rise MuBenjamin  McClintock M.D.   On: 03/31/2019 02:00   Ct Head Wo Contrast  Result Date: 03/17/2019 CLINICAL DATA:  Explain confusion. EXAM: CT HEAD WITHOUT CONTRAST CT CERVICAL SPINE WITHOUT CONTRAST TECHNIQUE: Multidetector CT imaging of the head and cervical spine was performed following the standard protocol without intravenous contrast. Multiplanar CT image reconstructions of the cervical spine were also generated. COMPARISON:  Brain MRI 02/17/2019, cervical spine MRI 02/18/2019 FINDINGS: CT HEAD FINDINGS Brain: No evidence of acute infarction, hemorrhage, hydrocephalus, extra-axial collection or mass lesion/mass effect. Moderate brain parenchymal volume loss and deep white matter microangiopathy. Vascular:  Calcific atherosclerotic disease of the intra cavernous carotid arteries. Skull: Normal. Negative for fracture or focal lesion. Sinuses/Orbits: No acute finding. Other: None CT CERVICAL SPINE FINDINGS Alignment: Normal. Skull base and vertebrae: No acute fracture. No primary bone lesion or focal pathologic process. Soft tissues and spinal canal: No prevertebral fluid or swelling. No visible canal hematoma. Disc levels:  Multilevel osteoarthritic changes. Upper chest: Negative. Other: IMPRESSION: 1. No acute intracranial abnormality. 2. Atrophy, chronic microvascular disease. 3. No evidence of acute traumatic injury to cervical spine. 4. Multilevel osteoarthritic changes of the cervical spine. Electronically Signed   By: Ted Mcalpineobrinka  Dimitrova M.D.   On: 03/17/2019 13:28   Ct Cervical Spine Wo Contrast  Result Date: 03/17/2019 CLINICAL DATA:  Explain confusion. EXAM: CT HEAD WITHOUT CONTRAST CT CERVICAL SPINE WITHOUT CONTRAST TECHNIQUE: Multidetector CT imaging of the head and cervical spine was performed following the standard protocol without intravenous contrast. Multiplanar CT image reconstructions of the cervical spine were also generated. COMPARISON:  Brain MRI 02/17/2019, cervical spine MRI 02/18/2019 FINDINGS: CT HEAD FINDINGS Brain: No evidence of acute infarction, hemorrhage, hydrocephalus, extra-axial collection or mass lesion/mass effect. Moderate brain parenchymal volume loss and deep white matter microangiopathy. Vascular: Calcific atherosclerotic disease of the intra cavernous carotid arteries. Skull: Normal. Negative for fracture or focal lesion. Sinuses/Orbits: No acute finding. Other: None CT CERVICAL SPINE FINDINGS Alignment: Normal. Skull base and vertebrae: No acute fracture. No primary bone lesion or focal pathologic process. Soft tissues and spinal canal: No prevertebral fluid or swelling. No visible canal hematoma. Disc levels:  Multilevel osteoarthritic changes. Upper chest: Negative. Other:  IMPRESSION: 1. No acute intracranial abnormality. 2. Atrophy, chronic microvascular disease. 3. No evidence of acute traumatic injury to cervical spine. 4. Multilevel osteoarthritic changes of the cervical spine. Electronically Signed   By: Ted Mcalpine M.D.   On: 03/17/2019 13:28   US Renal  Result Date: 04/04/2019 CLINICAL DATA:  Acute kidney injury EXAM: RENAL / URINARY TRACT ULTRASOUND COMPLETE COMPARISON:  05/19/2017 CT abdomen/pelvis. FINDINGS: Right Kidney: Renal measurements: 10.0 x 4.9 x 5.0 cm = volume: 129 mL. No hydronephrosis. Mildly echogenic and mildly atrophic right renal parenchyma. No right renal mass. Left Kidney: Renal measurements: 12.7 x 6.1 x 6.2 cm = volume: 251 mL. No hydronephrosis. Mildly echogenic renal parenchyma, normal in thickness. Simple appearing 2.2 x 1.9 x 1.9 cm interpolar left renal cyst. Bladder: Appears normal for degree of bladder distention. Ureteral jets are not visualized in the bladder on color Doppler. IMPRESSION: 1. No hydronephrosis. 2. Asymmetric right renal atrophy. 3. Echogenic kidneys, indicative of nonspecific renal parenchymal disease of uncertain chronicity. 4. Simple appearing left renal cyst. 5. Normal bladder. Electronically Signed   By: Delbert Phenix M.D.   On: 04/04/2019 12:20   Mr Hip Left Wo Contrast  Result Date: 03/19/2019 CLINICAL DATA:  Left hip pain and difficulty standing. Injury history is uncertain. EXAM: MR OF THE LEFT HIP WITHOUT CONTRAST TECHNIQUE: Multiplanar, multisequence MR imaging was performed. No intravenous contrast was administered. COMPARISON:  Plain films left hip 03/17/2019. FINDINGS: Bones: No fracture, dislocation or focal lesion. No worrisome lesion. Lower lumbar spondylosis with associated degenerative endplate signal change is noted. Articular cartilage and labrum Articular cartilage:  Appears preserved. Labrum:  The anterior, superior labrum is degenerated. Joint or bursal effusion Joint effusion:  None. Bursae:  Negative. Muscles and tendons Muscles and tendons: The patient has a complete tear of the left iliopsoas tendon from the lesser trochanter. The tendon is retracted 3-4 cm. Edema is seen about the distal tendon and tracks into the muscle belly most consistent with acute or early subacute tear. There is also some edema in the left adductor magnus compatible with strain without tear. Tendinosis of the hamstrings at their origins is worse on the left. Partial tear of the left hamstrings off the ischial tuberosity measures 1.4 cm AP with little to no retraction. Other findings Miscellaneous: Imaged intrapelvic contents demonstrate no acute abnormality. IMPRESSION: Negative for fracture or dislocation. Complete tear of the left ileus psoas tendon from the lesser tuberosity with 3-4 cm of retraction appears acute or early subacute. Chronic appearing tendinosis and partial tear of the left hamstrings from their origin. Edema in the left adductor magnus compatible with strain without tear. Electronically Signed   By: Drusilla Kanner M.D.   On: 03/19/2019 13:22   Nm Pulmonary Perf And Vent  Result Date: 04/05/2019 CLINICAL DATA:  Hypoxia, chest pain EXAM: NUCLEAR MEDICINE PERFUSION LUNG SCAN TECHNIQUE: Perfusion images were obtained  in multiple projections after intravenous injection of Tc-57m MAA. RADIOPHARMACEUTICALS:  4.5 mCi Tc92m MAA IV COMPARISON:  None. FINDINGS: Perfusion: No wedge shaped peripheral perfusion defects to suggest acute pulmonary embolism. IMPRESSION: No scintigraphic evidence of pulmonary embolus. Electronically Signed   By: Elige Ko   On: 04/05/2019 17:23   Dg Humerus Left  Result Date: 03/31/2019 CLINICAL DATA:  Initial evaluation for acute trauma, fall. EXAM: LEFT HUMERUS - 2+ VIEW COMPARISON:  None. FINDINGS: There is no evidence of fracture or other focal bone lesions. Mild degenerative changes noted about the shoulder and elbow. Soft tissues are unremarkable. IMPRESSION: No acute  osseous abnormality about the left humerus. Electronically Signed   By: Rise Mu M.D.   On: 03/31/2019 02:06   Ct Angio Chest/abd/pel For Dissection W And/or W/wo  Result Date: 03/17/2019 CLINICAL DATA:  83 year old female complaining of back pain. Recent history of fall. EXAM: CT ANGIOGRAPHY CHEST, ABDOMEN AND PELVIS TECHNIQUE: Multidetector CT imaging through the chest, abdomen and pelvis was performed using the standard protocol during bolus administration of intravenous contrast. Multiplanar reconstructed images and MIPs were obtained and reviewed to evaluate the vascular anatomy. CONTRAST:  OMNIPAQUE IOHEXOL 350 MG/ML SOLN COMPARISON:  No prior chest CT.  CT abdomen and pelvis 05/19/2017. FINDINGS: CTA CHEST FINDINGS Cardiovascular: Heart size is mildly enlarged. There is no significant pericardial fluid, thickening or pericardial calcification. There is aortic atherosclerosis, as well as atherosclerosis of the great vessels of the mediastinum and the coronary arteries, including calcified atherosclerotic plaque in the left main, left anterior descending, left circumflex and right coronary arteries. Status post median sternotomy for CABG including LIMA to the LAD. On precontrast images there is no crescentic high attenuation associated with the wall of the thoracic aorta to suggest acute intramural hemorrhage. No aortic aneurysm or dissection. Calcifications of the aortic valve and mitral annulus. Mediastinum/Nodes: No pathologically enlarged mediastinal or hilar lymph nodes. Esophagus is normal in appearance. No axillary lymphadenopathy. Lungs/Pleura: Scattered areas of linear scarring are noted in the lungs bilaterally, most evident near the lung apices and in the lung bases. Areas of mild dependent subsegmental atelectasis also noted in the lower lobes of the lungs bilaterally. No acute consolidative airspace disease. No pleural effusions. No definite suspicious appearing pulmonary  nodules or masses are noted. Musculoskeletal: Median sternotomy wires. There are no aggressive appearing lytic or blastic lesions noted in the visualized portions of the skeleton. Review of the MIP images confirms the above findings. CTA ABDOMEN AND PELVIS FINDINGS VASCULAR Aorta: Normal caliber aorta without aneurysm, dissection, vasculitis or significant stenosis. Celiac: Patent without evidence of aneurysm, dissection, vasculitis or significant stenosis. SMA: Patent without evidence of aneurysm, dissection, vasculitis or significant stenosis. Renals: Both renal arteries are patent without evidence of aneurysm, dissection, vasculitis, fibromuscular dysplasia or significant stenosis. IMA: Patent without evidence of aneurysm, dissection, vasculitis or significant stenosis. Inflow: Patent without evidence of aneurysm, dissection, vasculitis or significant stenosis. Veins: No obvious venous abnormality within the limitations of this arterial phase study. Review of the MIP images confirms the above findings. NON-VASCULAR Hepatobiliary: No suspicious cystic or solid hepatic lesions. Status post cholecystectomy. No intrahepatic biliary ductal dilatation. Common bile duct measures 7 mm in the porta hepatis, within normal limits for this post cholecystectomy patient. Pancreas: No pancreatic mass. No pancreatic ductal dilatation. No pancreatic or peripancreatic fluid or inflammatory changes. Spleen: Unremarkable. Adrenals/Urinary Tract: Low-attenuation lesions in both kidneys, compatible with simple cysts, largest of which measures 2.1 cm in the upper pole the  left kidney. Multifocal cortical thinning in the right kidney, presumably chronic scarring. No definite suspicious renal lesions. No hydroureteronephrosis. Urinary bladder is normal in appearance. Bilateral adrenal glands are normal in appearance. Stomach/Bowel: Normal appearance of the stomach. No pathologic dilatation of small bowel or colon. The appendix is not  confidently identified and may be surgically absent. Regardless, there are no inflammatory changes noted adjacent to the cecum to suggest the presence of an acute appendicitis at this time. Lymphatic: No lymphadenopathy noted in the abdomen or pelvis. Reproductive: Status post hysterectomy. Ovaries are not confidently identified may be surgically absent or trophic. Other: No significant volume of ascites.  No pneumoperitoneum. Musculoskeletal: Median sternotomy wires. No acute displaced fractures or aggressive appearing lytic or blastic lesions are noted in the visualized portions of the skeleton. Review of the MIP images confirms the above findings. IMPRESSION: 1. No acute findings are noted in the chest, abdomen or pelvis to account for the patient's symptoms. Specifically, no evidence of acute aortic syndrome. 2. Mild cardiomegaly. 3. Aortic atherosclerosis, in addition to left main and 3 vessel coronary artery disease. Status post median sternotomy for CABG including LIMA to the LAD. 4. There are calcifications of the aortic valve and mitral annulus. Echocardiographic correlation for evaluation of potential valvular dysfunction may be warranted if clinically indicated. 5. Additional incidental findings, as above. Aortic Atherosclerosis (ICD10-I70.0). Electronically Signed   By: Trudie Reed M.D.   On: 03/17/2019 15:50   Dg Femur Min 2 Views Left  Result Date: 03/31/2019 CLINICAL DATA:  Initial evaluation for acute trauma, fall. EXAM: LEFT FEMUR 2 VIEWS COMPARISON:  None. FINDINGS: No acute fracture or dislocation. Osseous mineralization normal. Mild osteoarthritic changes about the hip and knee. No soft tissue abnormality. Prominent atherosclerotic change noted. IMPRESSION: No acute osseous abnormality about the left femur. Electronically Signed   By: Rise Mu M.D.   On: 03/31/2019 01:55   Dg Hips Bilat W Or Wo Pelvis 5 Views  Result Date: 03/17/2019 CLINICAL DATA:  Bilateral hip pain  EXAM: DG HIP (WITH OR WITHOUT PELVIS) 5+V BILAT COMPARISON:  None. FINDINGS: There is no evidence of hip fracture or dislocation. There is no evidence of arthropathy or other focal bone abnormality. Peripheral vascular atherosclerotic disease. IMPRESSION: No acute osseous injury of the bilateral hips. Electronically Signed   By: Elige Ko   On: 03/17/2019 13:34    Catarina Hartshorn, DO  [facility] Pager [phone]  If 7PM-7AM, please contact night-coverage [email or web address] Password [email or web address] 04/06/2019, 1:09 PM   LOS: 2 days

## 2019-04-06 NOTE — Clinical Social Work Note (Signed)
Referred patient to [facility] for residential services.

## 2019-04-06 NOTE — Consult Note (Signed)
Consultation Note Date: 04/06/2019   Patient Name: [name] [name]  DOB: 06/02/1936  MRN: [id number]  Age / Sex: 83 y.o., female  PCP: Oval Linseyondiego, Richard, MD Referring Physician: Catarina Hartshornat, David, MD  Reason for Consultation: Establishing goals of care  HPI/Patient Profile: 83 y.o. female  with past medical history of chronic diastolic CHF, stroke, COPD, OSA, DM type 2, hypothyroidism, HTN, CAD, HLD admitted on 04/04/2019 with altered mental status. Multiple recent hospitalizations (two in March 2020) for acute metabolic encephalopathy, UTI, hypercarbia, hypoglycemia. After previous hospitalization, patient refused discharge to SNF and was discharged home with daughter. Hospital admission for acute metabolic encephalopathy secondary to acute kidney injury and UTI. Overall failure to thrive with functional/cognitive decline over the past 4-5 months per family. Palliative medicine consultation for goals of care.   Clinical Assessment and Goals of Care:  I have reviewed medical records, discussed with care team and assessed the patient at bedside. [name] is awake. She can tell me her name and that she is at a "hospital" but otherwise, confused and disoriented. Assisted physical therapy at bedside to transition her to chair. She is very weak and deconditioned. She complains of back pain. Per RN, ate bites for breakfast.   Due to Covid-19 visitor restrictions, phone conference with multiple family members including patients great-grandson/POA Antonietta Breach(Tyler Purgason), patient's two children Talbert Forest(Shirley and Casimiro NeedleMichael) and other grandchildren.   I introduced Palliative Medicine as specialized medical care for people living with serious illness. It focuses on providing relief from the symptoms and stress of a serious illness. The goal is to improve quality of life for both the patient and the family.  We discussed a brief life review of the  patient. Her family begins by telling me [name] [name] is a woman of [place] faith and this is "very important to her." She has three living children (one incarcerated) and numerous grand and great-grandchildren. Family speaks of her love for her cats and dogs.   Family shares that her "health has been failing" for many years with multiple co-morbidities. In the last few months, she has continued to decline with recurrent UTI's and falls as well as hx of multiple strokes, last stroke at the end of 2019. She has become immobile and requiring assist with ADL's. She is incontinent. Family shares devastating news that [name] [name]'s husband died ~2 weeks ago and since then, she has been "out of touch with reality" and "gave up."   Discussed events leading up to admission and course of hospitalization including diagnoses, interventions, and prognosis. Discussed underlying co-morbidities and declining cognitive/functional/nutritional status leading to overall failure to thrive and poor prognosis. Discussed high risk for recurrent hospitalization with ongoing decline. Discussed concern that she will not tolerate aggressive physical therapy at SNF.   I attempted to elicit values and goals of care important to the patient and family. Advanced directives, concepts specific to code status, artifical feeding and hydration, and rehospitalization were considered and discussed. Family confirms decision for DNR. Understanding "unfixable" problems, they further speak of family decision to  focus on making her more "comfortable" and managing her chronic back pain. "More important than pushing PT."   The difference between aggressive medical intervention and comfort care was considered. Discussed discontinuation of interventions not aimed at comfort and focus on comfort, quality, and dignity at EOL. Educated on EOL expectations. Introduced hospice philosophy and options. Family understands she may continue to decline quickly when antibiotics  and IVF are discontinued. Discussed comfort feeds. They again confirm decision to focus on comfort and pain management. Family wishes to pursue hospice facility placement. Gerldine's husband passed away at [facility].   Answered all questions and concerns. Emotional/spiritual support provided. PMT contact information given.    SUMMARY OF RECOMMENDATIONS    Extensive GOC discussion with multiple family members including great-grandson/POA and patient's two adult children.   Transition to comfort focused care. Family wishes to focus on comfort and pain management. Discontinued interventions not aimed at comfort.   SW consulted for residential hospice placement.  Comfort feeds per patient/family request.  Symptom management--see below.   Code Status/Advance Care Planning:  DNR  Symptom Management:   Oxycodone  PO q6h prn pain/dyspnea  Ativan 0.5mg  PO q6h prn anxiety  Lost IV access earlier. RN to attempt new IV but if hard stick, explained that we can continue prn comfort meds PO and SL.  Palliative Prophylaxis:   Aspiration, Delirium Protocol, Frequent Pain Assessment, Oral Care and Turn Reposition  Psycho-social/Spiritual:   Desire for further Chaplaincy support:yes  Additional Recommendations: Caregiving  Support/Resources, Compassionate Wean Education and Education on Hospice  Prognosis:   Likely <2 weeks with overall failure to thrive with declining functional/cognitive/nutritional status, UTI, AKI, and multiple underlying co-morbidities  Discharge Planning: Hospice facility      Primary Diagnoses: Present on Admission: . Acute metabolic encephalopathy . Uncontrolled type 2 diabetes mellitus with peripheral neuropathy (HCC) . OSA (obstructive sleep apnea) . Hyperlipidemia . Chronic diastolic CHF (congestive heart failure) (HCC) . Acute lower UTI . AKI (acute kidney injury) (HCC)   I have reviewed the medical record, interviewed the patient and  family, and examined the patient. The following aspects are pertinent.  Past Medical History:  Diagnosis Date  . Arthritis   . CAD (coronary artery disease)    Multivessel status post CABG 2001  . Chronic obstructive pulmonary disease (HCC)   . Chronic pain   . DDD (degenerative disc disease), lumbar   . Diastolic CHF (HCC)   . Essential hypertension   . Hematuria   . History of CVA (cerebrovascular accident)    2013 - lacunar infarct right thalamus - residual left side of  lip numb  and left eye vision worse (which corrented lens implant from cataract extraction)  . History of MI (myocardial infarction)    July 2001  . Hyperlipidemia   . Hypothyroidism   . Insulin dependent type 2 diabetes mellitus (HCC)   . Lesion of bladder   . Lower urinary tract symptoms (LUTS)   . OSA (obstructive sleep apnea)   . Peripheral neuropathy   . Psoriasis   . SUI (stress urinary incontinence, female)   . Varicose veins    Social History   Socioeconomic History  . Marital status: Married    Spouse name: Not on file  . Number of children: 2  . Years of education: Not on file  . Highest education level: Not on file  Occupational History    Employer: RETIRED  Social Needs  . Financial resource strain: Patient refused  . Food insecurity:  Worry: Patient refused    Inability: Patient refused  . Transportation needs:    Medical: Patient refused    Non-medical: Patient refused  Tobacco Use  . Smoking status: Never Smoker  . Smokeless tobacco: Never Used  Substance and Sexual Activity  . Alcohol use: No  . Drug use: No  . Sexual activity: Never  Lifestyle  . Physical activity:    Days per week: Patient refused    Minutes per session: Patient refused  . Stress: Patient refused  Relationships  . Social connections:    Talks on phone: Patient refused    Gets together: Patient refused    Attends religious service: Patient refused    Active member of club or organization: Patient  refused    Attends meetings of clubs or organizations: Patient refused    Relationship status: Patient refused  Other Topics Concern  . Not on file  Social History Narrative  . Not on file   Family History  Problem Relation Age of Onset  . Hypertension Mother   . Stroke Mother   . Coronary artery disease Other        Multiple first and second-degree relatives  . Aneurysm Other        Cerebral circulation  . Colon cancer Sister   . Stroke Sister   . Coronary artery disease Sister   . Clotting disorder Other        Children diagnosed with hypercoagulable state  . Coronary artery disease Brother    Scheduled Meds: . citalopram  20 mg Oral Daily   Continuous Infusions: . sodium chloride     PRN Meds:.acetaminophen **OR** acetaminophen, Ipratropium-Albuterol, LORazepam, ondansetron **OR** ondansetron (ZOFRAN) IV, oxyCODONE Medications Prior to Admission:  Prior to Admission medications   Medication Sig Start Date End Date Taking? Authorizing Provider  acetaminophen (TYLENOL) 325 MG tablet Take 2 tablets (650 mg total) by mouth every 6 (six) hours as needed for mild pain or headache (or Fever >/= 101). 02/26/19  Yes Emokpae, Courage, MD  aspirin EC 81 MG tablet Take 1 tablet (81 mg total) by mouth daily. 11/03/18  Yes Rhetta Mura, MD  ciprofloxacin (CIPRO) 500 MG tablet Take 500 mg by mouth 2 (two) times daily.  03/31/19  Yes [provider]  citalopram (CELEXA) 20 MG tablet Take 1 tablet (20 mg total) by mouth daily. 03/11/19  Yes Sharee Holster, NP  clopidogrel (PLAVIX) 75 MG tablet Take 1 tablet (75 mg total) by mouth daily for 30 days. 03/11/19 04/10/19 Yes Sharee Holster, NP  Insulin Isophane & Regular Human (HUMULIN 70/30 KWIKPEN) (70-30) 100 UNIT/ML PEN Inject 25 Units into the skin 2 (two) times daily. 03/11/19  Yes Sharee Holster, NP  levothyroxine (SYNTHROID, LEVOTHROID) 25 MCG tablet Take 1 tablet (25 mcg total) by mouth daily. 03/11/19  Yes Sharee Holster, NP  lisinopril (PRINIVIL,ZESTRIL) 10 MG tablet Take 1 tablet (10 mg total) by mouth daily. 03/11/19  Yes Sharee Holster, NP  nitroGLYCERIN (NITROSTAT) 0.4 MG SL tablet Place 1 tablet (0.4 mg total) under the tongue every 5 (five) minutes as needed for chest pain. 03/11/19  Yes Sharee Holster, NP  oxyCODONE (OXY IR/ROXICODONE) 5 MG immediate release tablet Take 1 tablet (5 mg total) by mouth every 6 (six) hours as needed for severe pain. Patient taking differently: Take 10 mg by mouth every 6 (six) hours as needed for severe pain.  03/11/19  Yes Sharee Holster, NP  pantoprazole (PROTONIX) 40 MG  tablet Take 1 tablet (40 mg total) by mouth daily at 6 (six) AM. 03/11/19  Yes Sharee Holster, NP  simvastatin (ZOCOR) 20 MG tablet Take 1 tablet (20 mg total) by mouth daily. 03/11/19  Yes Sharee Holster, NP  amLODipine (NORVASC) 5 MG tablet Take 5 mg by mouth every other day.    [provider]  benzonatate (TESSALON) 200 MG capsule Take 1 capsule (200 mg total) by mouth 3 (three) times daily as needed for cough. Patient not taking: Reported on 04/04/2019 03/11/19   Sharee Holster, NP  docusate sodium (COLACE) 100 MG capsule Take 100 mg by mouth daily. 03/09/19   [provider]  ferrous sulfate 325 (65 FE) MG tablet Take 1 tablet (325 mg total) by mouth daily with breakfast. Patient not taking: Reported on 04/04/2019 03/11/19   Sharee Holster, NP  gabapentin (NEURONTIN) 300 MG capsule Take 1 capsule (300 mg total) by mouth 2 (two) times daily. Patient not taking: Reported on 04/04/2019 03/11/19   Sharee Holster, NP  ipratropium-albuterol (DUONEB) 0.5-2.5 (3) MG/3ML SOLN Inhale 3 mLs into the lungs 4 (four) times daily as needed (for shortness of breath). 03/11/19   Sharee Holster, NP  oxyCODONE-acetaminophen (PERCOCET/ROXICET) 5-325 MG tablet Take 1 tablet by mouth every 8 (eight) hours as needed for severe pain. Patient not taking: Reported on 04/04/2019 03/31/19   Mesner, Barbara Cower, MD   UNABLE TO FIND CPAP from home and previous home settings while sleeping    [provider]   Allergies  Allergen Reactions  . Codeine Itching   Review of Systems  Unable to perform ROS: Mental status change   Physical Exam Vitals signs and nursing note reviewed.  Constitutional:      General: She is awake.  HENT:     Head: Normocephalic and atraumatic.  Pulmonary:     Effort: No tachypnea, accessory muscle usage or respiratory distress.  Skin:    General: Skin is warm and dry.     Coloration: Skin is pale.  Neurological:     Mental Status: She is alert.     Comments: Oriented to person and a "hospital", otherwise disoriented with pleasant confusion  Psychiatric:        Attention and Perception: She is inattentive.        Speech: Speech is delayed.        Cognition and Memory: Cognition is impaired.    Vital Signs: BP (!) 147/67 (BP Location: Right Arm)   Pulse 90   Temp 98.3 F (36.8 C)   Resp 19   Ht  (1.676 m)   Wt 89.7 kg   SpO2 100%   BMI 31.92 kg/m  Pain Scale: 0-10   Pain Score: 0-No pain   SpO2: SpO2: 100 % O2 Device:SpO2: 100 % O2 Flow Rate: .O2 Flow Rate (L/min): 2 L/min  IO: Intake/output summary:   Intake/Output Summary (Last 24 hours) at 04/06/2019 1637 Last data filed at 04/06/2019 1500 Gross per 24 hour  Intake 2242.73 ml  Output 1000 ml  Net 1242.73 ml    LBM: Last BM Date: 04/03/19 Baseline Weight: Weight: 91 kg Most recent weight: Weight: 89.7 kg     Palliative Assessment/Data: PPS 30%   Flowsheet Rows     Most Recent Value  Intake Tab  Referral Department  Hospitalist  Unit at Time of Referral  Med/Surg Unit  Palliative Care Primary Diagnosis  Sepsis/Infectious Disease  Palliative Care Type  New Palliative care  Reason for referral  Clarify Goals of Care  Date first seen by Palliative Care  04/06/19  Clinical Assessment  Palliative Performance Scale Score  30%  Psychosocial & Spiritual Assessment  Palliative  Care Outcomes  Patient/Family meeting held?  Yes  Who was at the meeting?  multiple family members via conference call  Palliative Care Outcomes  Clarified goals of care, Improved pain interventions, Improved non-pain symptom therapy, Counseled regarding hospice, Provided end of life care assistance, Provided psychosocial or spiritual support, Changed to focus on comfort, ACP counseling assistance, Transitioned to hospice      Time In/out: 0940-1000, 1300-1400 Time Total: 80 Greater than 50%  of this time was spent counseling and coordinating care related to the above assessment and plan.  Signed by:  Vennie Homans, FNP-C Palliative Medicine Team  Phone: [phone] Fax: [phone]   Please contact Palliative Medicine Team phone at [phone] for questions and concerns.  For individual provider: See Loretha Stapler

## 2019-04-06 NOTE — Progress Notes (Signed)
Inpatient Diabetes Program Recommendations  AACE/ADA: New Consensus Statement on Inpatient Glycemic Control (2015)  Target Ranges:  Prepandial:   less than 140 mg/dL      Peak postprandial:   less than 180 mg/dL (1-2 hours)      Critically ill patients:  140 - 180 mg/dL   Lab Results  Component Value Date   GLUCAP 108 (H) 04/06/2019   HGBA1C 8.5 (H) 02/18/2019    Review of Glycemic Control Results for [name] [name], [name] [name] (MRN [id number]) as of 04/06/2019 10:57  Ref. Range 04/05/2019 16:12 04/05/2019 21:27 04/05/2019 22:12 04/05/2019 22:43 04/05/2019 23:21 04/06/2019 07:35  Glucose-Capillary Latest Ref Range: 70 - 99 mg/dL 295 (H) Nov 7 units 60 (L) 56 (L) 73 88 108 (H)   Diabetes history: DM2 Outpatient Diabetes medications: 70/30 insulin mix 25 units bid Current orders for Inpatient glycemic control: Novolog moderate correction tid  Inpatient Diabetes Program Recommendations:   Noted hypoglycemia post Novolog correction scale given. -Add Levemir 6 units qd -Decrease Novolog correction to sensitive scale  Thank you, Darel Hong E. Justo Hengel, RN, MSN, CDE  Diabetes Coordinator Inpatient Glycemic Control Team Team Pager [phone] (8am-5pm) 04/06/2019 11:00 AM

## 2019-04-06 NOTE — Progress Notes (Signed)
PMT consult received and chart reviewed. Conference call with patient's daughter, granddaughter, and great-grandson/POA Antonietta Breach) this afternoon at 1pm. Thank you.  NO CHARGE  Vennie Homans, FNP-C Palliative Medicine Team  Phone: [phone] Fax: [phone]

## 2019-04-07 DIAGNOSIS — G4733 Obstructive sleep apnea (adult) (pediatric): Secondary | ICD-10-CM

## 2019-04-07 DIAGNOSIS — L89152 Pressure ulcer of sacral region, stage 2: Secondary | ICD-10-CM

## 2019-04-07 DIAGNOSIS — R6251 Failure to thrive (child): Secondary | ICD-10-CM

## 2019-04-07 LAB — GLUCOSE, CAPILLARY
Glucose-Capillary: 132 mg/dL — ABNORMAL HIGH (ref 70–99)
Glucose-Capillary: 176 mg/dL — ABNORMAL HIGH (ref 70–99)
Glucose-Capillary: 130 mg/dL — ABNORMAL HIGH (ref 70–99)
Glucose-Capillary: 141 mg/dL — ABNORMAL HIGH (ref 70–99)

## 2019-04-07 NOTE — Progress Notes (Signed)
Daily Progress Note   Patient Name: [name] [name]       Date: 04/07/2019 DOB: 10-20-1936  Age: 83 y.o. MRN#: [id number] Attending Physician: Vassie Loll, MD Primary Care Physician: Oval Linsey, MD Admit Date: 04/04/2019  Reason for Consultation/Follow-up: Establishing goals of care  Subjective: Patient wakes to voice. She is oriented to name, otherwise disoriented with pleasant confusion. No s/s of distress or discomfort.    Length of Stay: 3  Current Medications: Scheduled Meds:  . citalopram  20 mg Oral Daily    Continuous Infusions: . sodium chloride Stopped (04/06/19 1856)    PRN Meds: acetaminophen **OR** acetaminophen, Ipratropium-Albuterol, LORazepam, ondansetron **OR** ondansetron (ZOFRAN) IV, oxyCODONE  Physical Exam Vitals signs and nursing note reviewed.  Constitutional:      General: She is awake.     Appearance: She is ill-appearing.  HENT:     Head: Normocephalic and atraumatic.  Pulmonary:     Effort: No tachypnea, accessory muscle usage or respiratory distress.  Abdominal:     Tenderness: There is no abdominal tenderness.  Skin:    General: Skin is warm and dry.     Coloration: Skin is pale.  Neurological:     Mental Status: She is alert.     Comments: Oriented to self, otherwise disoriented  Psychiatric:        Attention and Perception: She is inattentive.        Mood and Affect: Mood normal.        Speech: Speech normal.        Cognition and Memory: Cognition is impaired.            Vital Signs: BP (!) 145/70 (BP Location: Right Arm)   Pulse 81   Temp (!) 97.5 F (36.4 C) (Oral)   Resp 17   Ht  (1.676 m)   Wt 89.7 kg   SpO2 96%   BMI 31.92 kg/m  SpO2: SpO2: 96 % O2 Device: O2 Device: Nasal Cannula O2 Flow Rate: O2 Flow  Rate (L/min): 2 L/min  Intake/output summary:   Intake/Output Summary (Last 24 hours) at 04/07/2019 1109 Last data filed at 04/07/2019 0900 Gross per 24 hour  Intake 120 ml  Output 850 ml  Net -730 ml   LBM: Last BM Date: 04/03/19 Baseline Weight: Weight: 91 kg Most  recent weight: Weight: 89.7 kg       Palliative Assessment/Data: PPS 30%   Flowsheet Rows     Most Recent Value  Intake Tab  Referral Department  Hospitalist  Unit at Time of Referral  Med/Surg Unit  Palliative Care Primary Diagnosis  Sepsis/Infectious Disease  Palliative Care Type  New Palliative care  Reason for referral  Clarify Goals of Care  Date first seen by Palliative Care  04/06/19  Clinical Assessment  Palliative Performance Scale Score  30%  Psychosocial & Spiritual Assessment  Palliative Care Outcomes  Patient/Family meeting held?  Yes  Who was at the meeting?  multiple family members via conference call  Palliative Care Outcomes  Clarified goals of care, Improved pain interventions, Improved non-pain symptom therapy, Counseled regarding hospice, Provided end of life care assistance, Provided psychosocial or spiritual support, Changed to focus on comfort, ACP counseling assistance, Transitioned to hospice      Patient Active Problem List   Diagnosis Date Noted  . Palliative care by specialist   . Hypoxia   . Pressure injury of skin 04/05/2019  . Acute respiratory failure with hypoxia (HCC) 04/05/2019  . Goals of care, counseling/discussion 04/04/2019  . Acute cystitis without hematuria   . Altered mental status   . Acute metabolic encephalopathy 03/17/2019  . Chronic diastolic CHF (congestive heart failure) (HCC) 03/17/2019  . Iron deficiency anemia 03/12/2019  . Protein-calorie malnutrition, moderate (HCC) 03/12/2019  . Hypertension associated with type 2 diabetes mellitus (HCC) 03/09/2019  . GERD without esophagitis 03/09/2019  . Dyslipidemia associated with type 2 diabetes mellitus (HCC)  03/09/2019  . Uncontrolled type 2 diabetes mellitus with peripheral neuropathy (HCC) 03/09/2019  . Major depression, recurrent, chronic (HCC) 03/09/2019  . Hypoglycemia 02/22/2019  . Generalized weakness 02/12/2019  . Fall at home 02/12/2019  . Symptomatic Yeast UTI 02/12/2019  . Leukocytosis 02/12/2019  . AKI (acute kidney injury) (HCC) 02/12/2019  . COPD with acute exacerbation (HCC) 12/13/2018  . Syncope   . Loss of consciousness (HCC) 06/10/2018  . Bradycardia, sinus 06/10/2018  . Diastolic dysfunction 09/29/2017  . CAD (coronary artery disease) 09/28/2017  . Acute lower UTI 09/28/2017  . Ischemic stroke (HCC) 05/10/2017  . Acute ischemic stroke (HCC) 05/10/2017  . Dysarthria 03/18/2016  . Anemia of chronic disease 11/19/2015  . OSA (obstructive sleep apnea) 11/19/2015  . Left facial numbness 08/29/2015  . Gait instability 06/26/2015  . COPD exacerbation (HCC) 11/13/2014  . Weakness generalized 12/10/2013  . TIA (transient ischemic attack) 06/22/2012  . Left-sided headache 06/22/2012  . Noncompliance 06/22/2012  . Noncompliance with CPAP treatment 06/22/2012  . Psoriasis 01/07/2012  . Chronic back pain   . Chronic obstructive pulmonary disease (HCC)   . DM type 2 causing complication (HCC) 02/14/2010  . Obesity 02/14/2010  . Arteriosclerotic cardiovascular disease (ASCVD) 02/14/2010  . Hypothyroidism 02/06/2010  . Hyperlipidemia 02/06/2010  . Essential hypertension 02/06/2010  . Sleep apnea 02/06/2010    Palliative Care Assessment & Plan   Patient Profile: 83 y.o. female  with past medical history of chronic diastolic CHF, stroke, COPD, OSA, DM type 2, hypothyroidism, HTN, CAD, HLD admitted on 04/04/2019 with altered mental status. Multiple recent hospitalizations (two in March 2020) for acute metabolic encephalopathy, UTI, hypercarbia, hypoglycemia. After previous hospitalization, patient refused discharge to SNF and was discharged home with daughter. Hospital  admission for acute metabolic encephalopathy secondary to acute kidney injury and UTI. Overall failure to thrive with functional/cognitive decline over the past 4-5 months per family. Palliative  medicine consultation for goals of care.  Assessment: Acute metabolic encephalopathy Acute kidney injury Recurrent UTI's Acute respiratory failure with  Hypoxia Adult failure to thrive Diabetes mellitus type 2 Hx of multiple CVA's Hx of chronic diastolic CHF Hx of COPD/OSA  Recommendations/Plan:  Extensive GOC with multiple family members on 04/06/19. Family decision to focus on comfort measures and pain management. Interventions not aimed at comfort have been discontinued.   Continue prn comfort medications. Patient able to take PO.  Comfort feeds per patient request.   SW following for residential hospice placement.   Code Status: DNR/DNI   Code Status Orders  (From admission, onward)         Start     Ordered   04/04/19 1430  Do not attempt resuscitation (DNR)  Continuous    Question Answer Comment  In the event of cardiac or respiratory ARREST Do not call a "code blue"   In the event of cardiac or respiratory ARREST Do not perform Intubation, CPR, defibrillation or ACLS   In the event of cardiac or respiratory ARREST Use medication by any route, position, wound care, and other measures to relive pain and suffering. May use oxygen, suction and manual treatment of airway obstruction as needed for comfort.      04/04/19 1429        Code Status History    Date Active Date Inactive Code Status Order ID Comments User Context   03/17/2019 1648 03/20/2019 1902 Full Code [id number]  Angie Fava, DO ED   02/22/2019 2304 02/26/2019 1737 Full Code [id number]  Briscoe Deutscher, MD ED   02/12/2019 1443 02/22/2019 1954 Full Code [id number]  Cleora Fleet, MD Inpatient   12/13/2018 1032 12/17/2018 1727 Full Code [id number]  Erick Blinks, DO Inpatient   11/01/2018 1640 11/03/2018 1934 Full  Code [id number]  Floydene Flock, MD ED   09/13/2018 0631 09/20/2018 1755 Full Code [id number]  Meredeth Ide, MD Inpatient   08/28/2018 0112 09/01/2018 1628 Full Code [id number]  Briscoe Deutscher, MD ED   06/10/2018 1516 06/12/2018 1559 Full Code [id number]  Cleora Fleet, MD Inpatient   09/29/2017 0249 10/01/2017 1838 Full Code [id number]  Bobette Mo, MD Inpatient   09/28/2017 2249 09/29/2017 0249 Full Code [id number]  Bobette Mo, MD Inpatient   05/10/2017 1546 05/13/2017 2125 Full Code [id number]  Versie Starks, MD Inpatient   09/19/2016 2105 09/22/2016 1734 Full Code [id number]  Shon Hale, MD Inpatient   08/01/2016 1956 08/06/2016 1547 Full Code [id number]  Levie Heritage, DO Inpatient   04/22/2016 0305 04/25/2016 1824 Full Code [id number]  Houston Siren, MD Inpatient   03/18/2016 1742 03/22/2016 2138 Full Code [id number]  Henderson Cloud, MD Inpatient   11/19/2015 1622 11/22/2015 1649 Full Code [id number]  Standley Brooking, MD Inpatient   08/29/2015 1957 09/01/2015 2032 Full Code [id number]  Wilson Singer, MD Inpatient   06/26/2015 1942 06/29/2015 1246 Full Code [id number]  Wilson Singer, MD ED   11/13/2014 1604 11/16/2014 1646 Full Code [id number]  Henderson Cloud, MD Inpatient   08/24/2014 2028 08/31/2014 1521 Full Code [id number]  Eddie North, MD Inpatient   12/10/2013 1057 12/14/2013 1347 Full Code [id number]  Gwenyth Bender, NP Inpatient   06/22/2012 2044 06/23/2012 1852 Full Code [id number]  Rosanna Randy, RN ED   01/30/2012 0033 02/02/2012 1248 Full Code [id number]  Omar Person, RN Inpatient    Advance Directive Documentation  Most Recent Value  Type of Advance Directive  Healthcare Power of Attorney  Pre-existing out of facility DNR order (yellow form or pink MOST form)  -  "MOST" Form in Place?  -       Prognosis:  Possibly weeks with overall failure to thrive, declining functional/cognitive/nutritional status, UTI, AKI, and multiple underlying  co-morbidities  Discharge Planning:   Hospice facility  Care plan was discussed with patient, Dr. Gwenlyn PerkingMadera, RN  Thank you for allowing the Palliative Medicine Team to assist in the care of this patient.   Time In: 1050 Time Out: 1110 Total Time 20 Prolonged Time Billed  no      Greater than 50%  of this time was spent counseling and coordinating care related to the above assessment and plan.  Vennie HomansMegan Robben Jagiello, FNP-C Palliative Medicine Team  Phone: [phone] Fax: [phone]  Please contact Palliative Medicine Team phone at [phone] for questions and concerns.

## 2019-04-07 NOTE — Care Management Important Message (Signed)
Important Message  Patient Details  Name: [name] [name] MRN: [id number] Date of Birth: [birth year]   Medicare Important Message Given:  Yes(given to nurse due to patient being on contact precaution)    Corey Harold 04/07/2019, 11:32 AM

## 2019-04-07 NOTE — Progress Notes (Signed)
PROGRESS NOTE  [name] [name] ZOX:[id number] DOB: 12-Nov-1936 DOA: 04/04/2019 PCP: Oval Linsey, MD   Brief History:  83 y.o.femalewith medical history ofdiabetes mellitus type 2, chronic diastolic CHF, stroke, COPD, hypothyroidism, hypertension, coronary disease, hyperlipidemia, OSA presenting with altered mental status. The patient has had numerous hospitalizations with similar presentation. Most recently, the patient was discharged from the hospital after a stay from 03/17/2019 through 03/20/2019 for acute metabolic encephalopathy that was multifactorial including UTI and hypercarbia. In addition, the patient had another admission from 02/22/2019 through 02/26/2019 for encephalopathy secondary to hypoglycemia and UTI. Since her discharge from the hospital on 03/20/2019, the patient was discharged home to live with her daughter. The patient had refused to go to a skilled nursing facility at that time. Initially, the patient was able to stand and take a few steps with a walker. However over the past week, the patient's family has noted increasing generalized weakness, decreased oral intake, and more somnolence. On 03/29/2019, the patient was complaining of some dysuria. Her home health nurse called the patient's PCP. The patient was prescribed ciprofloxacin. Since that date, the patient has had increasing somnolence which was significantly worse on 04/03/2019. During this past week, the patient has had significantly decreased oral intake. According to the patient's family, the patient was somnolent and slept most of the day. When she woke up on 04/04/2019, the patient complained of chest discomfort. As result, EMS was activated. During this past week, the patient has not had any recurrent discomfort or complained of any chronic worsening cough or shortness of breath. The patient did have a low-grade temperature of 100.2 F on 04/03/2019. There is been no complaints of headaches,  nausea, vomiting, diarrhea, abdominal pain. According to the patient's daughter, they have been compliant with social distancing in the context of the coronavirus pandemic. There is been no sick contacts. Other than the ciprofloxacin, there has not been any new medications. In the emergency department, the patient had a low-grade temperature of 99.7 F. She was hemodynamically stable. Oxygen saturations were 92--95% on room air. BMP showed a serum creatinine of 3.48 which was significantly above her usual baseline. WBC was 10.2 with hemoglobin 11.3 and platelets 270,000. Chest x-ray showed poor inspiration withoutconsolidation or edema. Renal ultrasound was negative for hydronephrosis but showed right renal atrophy. CT of the brain was negative for acute findings. Lactic acid was 1.8. Urinalysis showed 21-50 WBC. The patient was given a liter bolus of normal saline and started on ceftriaxone.  Assessment/Plan: Acute metabolic encephalopathy -Secondary to acute kidney injury and UTI, ciprofloxacin as well as other hypnotic medications including oxycodone/gabapentin which have been discontinued -At baseline, the patient has poor functional status requiring assistance with all aspects of activities of daily living -She has been essentially bedbound for the past week -After further discussion with family and following recommendations by palliative care patient has been transition to full comfort care. -All medications and treatment other than to achieve comfort has been discontinued -Continue the use of Celexa daily, started on oxycodone and Ativan as needed.  Acute kidney injury -Secondary to volume depletion and hemodynamic changes (low BPs) -baseline creatinine 0.7-0.8 -serum creatinine peaked 3.64 -Continue avoiding nephrotoxic agents. -Renal ultrasound negative for hydronephrosis -IV fluid has been discontinued. -Plan of care is for comfort.  Pyuria -Concerning for UTI on  admission. -She received treatment with Rocephin for 3 days; no significant improvement or changes in condition. -After discussing with family decision has  been made to pursuit only comfort care -Antibiotic has been discontinued.  Hypoxia/Acute respiratory failure with hypoxia -due to OSA/OHS and hypoventilation -ABG--7.305/47/82/22 on 2L -oxygen saturation 92-94% on RA; desaturates during sleep -checked D-dimer--0.95 -V/Q scan neg -Continue 2 L oxygen supplementation as part of comfort care.  Essential hypertension -We will discontinue antihypertensive medication in the setting of full comfort care.  Diabetes mellitus type 2, uncontrolled with hyperglycemia/hypoglycemia -Given the patient decreased oral intake and following decision for full comfort only will discontinue insulin therapy.   -Last A1c 8.5.   -With patient has demonstrated intermittent episode of hypoglycemia in the setting of poor oral intake.  Failure to thrive in adult -The patient's daughter relates that functional and cognitive decline over the past 4 to 5 months has been steadily and progressive. -Goals of care discussed--patient is DNR/DNI -PT evaluation--recommended 24 hour care -Serum B12--724 -Folic acid--20.6 -ammonia < 9 -03/18/2019 TSH 0.558 -patient continues to have poor po intake resulting in hypoglycemia intermittently.  Hyperlipidemia -Statin has been discontinued -Plan of care is for comfort  Hypothyroidism -levothyroxine discontinued -Plan of care is full comfort.  History of multiple strokes -Prior to admission patient was using aspirin and Plavix for secondary prevention -Strokes has been associated with severe right PCA stenosis. -Following discussion with family and current patient's condition; aspirin and Plavix has been discontinued and focus on comfort care only.   Goals of Care. -DNR/DNI -Appreciate assistance and recommendation by palliative care medicine -Given overall  decline, failure to thrive and poor functional state decision has been made to pursuit for comfort care.   -Hospice has been involved and will follow recommendation -For now continue to focus on full comfort care and comfort feeding -not invasive or heroic measures   Stage 2 sacral decubitus -present prior to admission -Continue preventive measures.    Disposition Plan:   SNF vs residential hospice Family Communication:  No Family at bedside  Consultants:  Palliative medicine  Code Status:  DNR  DVT Prophylaxis:  Rand Heparin   Procedures: As Listed in Progress Note Above  Antibiotics: None   Subjective: Pleasantly confused.  Able to answer questions appropriately and to couple bites for breakfast and lunch.  Denies shortness of breath, chest pain, nausea or vomiting.  Expressed pain in her back and knees.  Objective: Vitals:   04/06/19 2134 04/07/19 0533 04/07/19 1400 04/07/19 1512  BP: (!) 125/96 (!) 145/70 (!) 147/136 (!) 152/74  Pulse: 79 81 82 83  Resp: 18 17    Temp: 98.8 F (37.1 C) (!) 97.5 F (36.4 C) 98.2 F (36.8 C)   TempSrc: Oral Oral Oral   SpO2: 97% 96% 98% (!) 89%  Weight:      Height:        Intake/Output Summary (Last 24 hours) at 04/07/2019 1600 Last data filed at 04/07/2019 1130 Gross per 24 hour  Intake 240 ml  Output 350 ml  Net -110 ml   Weight change:  Exam: General exam: Alert, awake, oriented x 2 (intermittently).  Patient demonstrated poor insight overall.  Complaining of back pain and pain in her knees.  Afebrile. Respiratory system: Fair air movement bilaterally; using nasal cannula supplementation.  Positive rhonchi and mild expiratory wheezing.   Cardiovascular system:RRR. No murmurs, rubs, gallops. Gastrointestinal system: Abdomen is nondistended, soft and nontender. No organomegaly or masses felt. Normal bowel sounds heard. Central nervous system: Alert and oriented. No focal neurological deficits. Extremities: No  cyanosis or clubbing.  Trace edema bilaterally Skin: No rashes,  no petechiae.  Stage II decubitus pressure injury present prior to admission.  No signs of superimposed infection appreciated. Psychiatry: No suicidal ideation or hallucinations.   Data Reviewed: I have personally reviewed following labs and imaging studies Basic Metabolic Panel: Recent Labs  Lab 04/04/19 0942 04/05/19 0458 04/06/19 0528  NA 139 141 143  K 4.8 4.5 4.7  CL 103 109 111  CO2 25 22 21*  GLUCOSE 146* 118* 126*  BUN 26* 26* 26*  CREATININE 3.48* 3.64* 3.56*  CALCIUM 9.1 8.2* 8.7*  MG  --  1.9  --    Liver Function Tests: Recent Labs  Lab 04/04/19 0942  AST 19  ALT 13  ALKPHOS 53  BILITOT 1.0  PROT 6.3*  ALBUMIN 3.5    Recent Labs  Lab 04/05/19 1214  AMMONIA <9*   Coagulation Profile: Recent Labs  Lab 04/04/19 0942  INR 1.2   CBC: Recent Labs  Lab 04/04/19 0942 04/05/19 0458 04/06/19 0528  WBC 10.2 7.7 7.9  NEUTROABS 7.3  --   --   HGB 11.3* 9.5* 11.2*  HCT 38.1 33.3* 39.3  MCV 84.9 86.5 86.6  PLT 270 244 248   Cardiac Enzymes: Recent Labs  Lab 04/04/19 0944 04/04/19 1529 04/04/19 2228 04/06/19 0528  CKTOTAL  --  98  --  88  TROPONINI <0.03 <0.03 <0.03  --    BNP: Invalid input(s): POCBNP CBG: Recent Labs  Lab 04/06/19 1109 04/06/19 1706 04/06/19 2137 04/07/19 0726 04/07/19 1107  GLUCAP 214* 91 115* 132* 176*   Urine analysis:    Component Value Date/Time   COLORURINE YELLOW 04/04/2019 0932   APPEARANCEUR HAZY (A) 04/04/2019 0932   LABSPEC 1.006 04/04/2019 0932   PHURINE 5.0 04/04/2019 0932   GLUCOSEU NEGATIVE 04/04/2019 0932   HGBUR NEGATIVE 04/04/2019 0932   BILIRUBINUR NEGATIVE 04/04/2019 0932   KETONESUR NEGATIVE 04/04/2019 0932   PROTEINUR NEGATIVE 04/04/2019 0932   UROBILINOGEN 0.2 08/29/2015 1842   NITRITE NEGATIVE 04/04/2019 0932   LEUKOCYTESUR MODERATE (A) 04/04/2019 0932    Recent Results (from the past 240 hour(s))  Culture, blood  (Routine x 2)     Status: None (Preliminary result)   Collection Time: 04/04/19  9:32 AM  Result Value Ref Range Status   Specimen Description   Final    BLOOD LEFT HAND Blood Culture results may not be optimal due to an inadequate volume of blood received in culture bottles BOTTLES DRAWN AEROBIC AND ANAEROBIC   Special Requests NONE  Final   Culture   Final    NO GROWTH 3 DAYS Performed at [facility], [address], [place], [place] [address]    Report Status PENDING  Incomplete  Urine Culture     Status: None   Collection Time: 04/04/19  9:32 AM  Result Value Ref Range Status   Specimen Description   Final    URINE, RANDOM Performed at [facility], [address], [place], [place] [address]    Special Requests   Final    NONE Performed at [facility], [address] [place], [place] [address]    Culture   Final    NO GROWTH Performed at [facility], [address], [place], [place] [address]    Report Status 04/05/2019 FINAL  Final  Culture, blood (Routine x 2)     Status: None (Preliminary result)   Collection Time: 04/04/19  9:37 AM  Result Value Ref Range Status   Specimen Description   Final  BLOOD RIGHT ANTECUBITAL Blood Culture results may not be optimal due to an excessive volume of blood received in culture bottles BOTTLES DRAWN AEROBIC AND ANAEROBIC   Special Requests NONE  Final   Culture   Final    NO GROWTH 3 DAYS Performed at [facility], [address], [place], [place] [address]    Report Status PENDING  Incomplete  SARS Coronavirus 2 [facility] order, Performed in [facility] hospital lab)     Status: None   Collection Time: 04/06/19  1:13 PM  Result Value Ref Range Status   SARS Coronavirus 2 NEGATIVE NEGATIVE Final    Comment: (NOTE) If result is NEGATIVE SARS-CoV-2 target nucleic acids are NOT DETECTED. The SARS-CoV-2 RNA is generally detectable in upper and lower  respiratory specimens during the acute phase of infection. The lowest    concentration of SARS-CoV-2 viral copies this assay can detect is 250  copies / mL. A negative result does not preclude SARS-CoV-2 infection  and should not be used as the sole basis for treatment or other  patient management decisions.  A negative result may occur with  improper specimen collection / handling, submission of specimen other  than nasopharyngeal swab, presence of viral mutation(s) within the  areas targeted by this assay, and inadequate number of viral copies  (<250 copies / mL). A negative result must be combined with clinical  observations, patient history, and epidemiological information. If result is POSITIVE SARS-CoV-2 target nucleic acids are DETECTED. The SARS-CoV-2 RNA is generally detectable in upper and lower  respiratory specimens dur ing the acute phase of infection.  Positive  results are indicative of active infection with SARS-CoV-2.  Clinical  correlation with patient history and other diagnostic information is  necessary to determine patient infection status.  Positive results do  not rule out bacterial infection or co-infection with other viruses. If result is PRESUMPTIVE POSTIVE SARS-CoV-2 nucleic acids MAY BE PRESENT.   A presumptive positive result was obtained on the submitted specimen  and confirmed on repeat testing.  While 2019 novel coronavirus  (SARS-CoV-2) nucleic acids may be present in the submitted sample  additional confirmatory testing may be necessary for epidemiological  and / or clinical management purposes  to differentiate between  SARS-CoV-2 and other Sarbecovirus currently known to infect humans.  If clinically indicated additional testing with an alternate test  methodology [phone]) is advised. The SARS-CoV-2 RNA is generally  detectable in upper and lower respiratory sp ecimens during the acute  phase of infection. The expected result is Negative. Fact Sheet for Patients:  BoilerBrush.com.cy Fact Sheet  for Healthcare Providers: https://pope.com/ This test is not yet approved or cleared by the [place] FDA and has been authorized for detection and/or diagnosis of SARS-CoV-2 by FDA under an Emergency Use Authorization (EUA).  This EUA will remain in effect (meaning this test can be used) for the duration of the COVID-19 declaration under Section 564(b)(1) of the Act, 21 U.S.C. section 360bbb-3(b)(1), unless the authorization is terminated or revoked sooner. Performed at [facility], [address], [place], [place] [address]      Scheduled Meds:  citalopram  20 mg Oral Daily   Continuous Infusions:  sodium chloride Stopped (04/06/19 1856)    Procedures/Studies: Dg Chest 1 View  Result Date: 04/05/2019 CLINICAL DATA:  83 year old female with a history of hypoxia EXAM: CHEST  1 VIEW COMPARISON:  04/04/2019, 03/17/2019, CT chest 03/17/2019 FINDINGS: Cardiomediastinal silhouette unchanged in size and contour with surgical changes  of median sternotomy and CABG. Low lung volumes. Interlobular septal thickening with coarsened interstitial markings. Similar appearance of linear opacity on the right and linear opacities at the left lung base. Blunting of the costophrenic angle bilaterally with meniscus on the right. IMPRESSION: Similar appearance of low lung volumes with atelectasis and evidence of edema. Small right pleural effusion and associated atelectasis/consolidation. Surgical changes of median sternotomy and CABG. Electronically Signed   By: Gilmer Mor D.O.   On: 04/05/2019 17:17   Dg Chest 1 View  Result Date: 03/17/2019 CLINICAL DATA:  Altered mental status. EXAM: CHEST  1 VIEW COMPARISON:  02/12/2019 FINDINGS: Postsurgical changes from CABG. The cardiac silhouette is enlarged. There is an apparent widening of the mediastinum, when compared to patient's prior radiographs. There is no evidence of focal airspace consolidation, or pneumothorax.  Possible right pleural effusion. Osseous structures are without acute abnormality. Soft tissues are grossly normal. IMPRESSION: 1. Apparent widening of the mediastinum, when compared to patient's prior radiographs. This may be due to poor inspiratory effort, however vascular anomaly can not be excluded. Please correlate to patient's symptomatology. If the patient displays signs of acute aortic syndrome, then CT angiogram of the chest should be performed. Otherwise PA and lateral radiograph the chest may be considered. 2. Possible right pleural effusion. These results were called by telephone at the time of interpretation on 03/17/2019 at 1:43 pm to Dr. Samuel Jester , who verbally acknowledged these results. Electronically Signed   By: Ted Mcalpine M.D.   On: 03/17/2019 13:45   Dg Chest 2 View  Result Date: 04/04/2019 CLINICAL DATA:  83 year old with acute mental status changes that began yesterday. Patient is currently undergoing treatment for a urinary tract infection. Acute chest pain that began earlier today. EXAM: CHEST - 2 VIEW COMPARISON:  CT chest and chest x-ray 03/17/2019 and earlier. FINDINGS: AP ERECT and LATERAL images were obtained. Markedly suboptimal inspiration. Prior sternotomy for CABG. Cardiac silhouette mildly enlarged, unchanged. Pulmonary vascularity normal without evidence of pulmonary edema. Linear scarring scattered throughout both lungs, unchanged. Lungs otherwise clear. No visible pleural effusions. Prior ORIF of a LEFT humerus fracture. IMPRESSION: Markedly suboptimal inspiration. Stable mild cardiomegaly. No acute cardiopulmonary disease. Electronically Signed   By: Hulan Saas M.D.   On: 04/04/2019 11:11   Dg Pelvis 1-2 Views  Result Date: 03/31/2019 CLINICAL DATA:  Fall 2 days ago with left pelvis/hip and femur pain. Recent fall leading to iliopsoas tendon tear. EXAM: PELVIS - 1-2 VIEW COMPARISON:  Pelvis and hip radiographs 03/17/2019. Pelvic MRI 03/19/2019  FINDINGS: The cortical margins of the bony pelvis are intact. No fracture. Pubic symphysis and sacroiliac joints are congruent. Both femoral heads are well-seated in the respective acetabula. There are vascular calcifications. IMPRESSION: No evidence of pelvic fracture. Electronically Signed   By: Narda Rutherford M.D.   On: 03/31/2019 02:06   Ct Head Wo Contrast  Result Date: 04/04/2019 CLINICAL DATA:  Altered level of consciousness. EXAM: CT HEAD WITHOUT CONTRAST TECHNIQUE: Contiguous axial images were obtained from the base of the skull through the vertex without intravenous contrast. COMPARISON:  CT head 03/31/2019 FINDINGS: Brain: Atrophy and chronic microvascular ischemic changes are stable. No new findings. No acute infarct, hemorrhage, or mass. Vascular: Negative for hyperdense vessel Skull: Negative Sinuses/Orbits: Small air-fluid level posterior ethmoid sinus on the right. Remaining sinuses clear. Bilateral cataract surgery. Other: None IMPRESSION: No acute abnormality and no change from the recent study. Electronically Signed   By: Marlan Palau M.D.  On: 04/04/2019 10:57   Ct Head Wo Contrast  Result Date: 03/31/2019 CLINICAL DATA:  Initial evaluation for acute trauma, recent fall. EXAM: CT HEAD WITHOUT CONTRAST TECHNIQUE: Contiguous axial images were obtained from the base of the skull through the vertex without intravenous contrast. COMPARISON:  Prior CT from 03/17/2019. FINDINGS: Brain: Generalized age-related cerebral atrophy with moderate chronic microvascular ischemic disease. No acute intracranial hemorrhage. No acute large vessel territory infarct. No mass lesion, midline shift or mass effect. No hydrocephalus. No extra-axial fluid collection. Vascular: No hyperdense vessel. Scattered vascular calcifications noted within the carotid siphons. Skull: Scalp soft tissues demonstrate no acute finding. Calvarium intact. Sinuses/Orbits: Globes and orbital soft tissues within normal limits. Mild  layering opacity within the right sphenoid sinus. Paranasal sinuses are otherwise clear. No mastoid effusion. Other: None. IMPRESSION: 1. No acute intracranial abnormality. 2. Generalized age-related cerebral atrophy with moderate chronic small vessel ischemic disease. 3. Mild right sphenoid sinusitis. Electronically Signed   By: Rise Mu M.D.   On: 03/31/2019 02:00   Ct Head Wo Contrast  Result Date: 03/17/2019 CLINICAL DATA:  Explain confusion. EXAM: CT HEAD WITHOUT CONTRAST CT CERVICAL SPINE WITHOUT CONTRAST TECHNIQUE: Multidetector CT imaging of the head and cervical spine was performed following the standard protocol without intravenous contrast. Multiplanar CT image reconstructions of the cervical spine were also generated. COMPARISON:  Brain MRI 02/17/2019, cervical spine MRI 02/18/2019 FINDINGS: CT HEAD FINDINGS Brain: No evidence of acute infarction, hemorrhage, hydrocephalus, extra-axial collection or mass lesion/mass effect. Moderate brain parenchymal volume loss and deep white matter microangiopathy. Vascular: Calcific atherosclerotic disease of the intra cavernous carotid arteries. Skull: Normal. Negative for fracture or focal lesion. Sinuses/Orbits: No acute finding. Other: None CT CERVICAL SPINE FINDINGS Alignment: Normal. Skull base and vertebrae: No acute fracture. No primary bone lesion or focal pathologic process. Soft tissues and spinal canal: No prevertebral fluid or swelling. No visible canal hematoma. Disc levels:  Multilevel osteoarthritic changes. Upper chest: Negative. Other: IMPRESSION: 1. No acute intracranial abnormality. 2. Atrophy, chronic microvascular disease. 3. No evidence of acute traumatic injury to cervical spine. 4. Multilevel osteoarthritic changes of the cervical spine. Electronically Signed   By: Ted Mcalpine M.D.   On: 03/17/2019 13:28   Ct Cervical Spine Wo Contrast  Result Date: 03/17/2019 CLINICAL DATA:  Explain confusion. EXAM: CT HEAD WITHOUT  CONTRAST CT CERVICAL SPINE WITHOUT CONTRAST TECHNIQUE: Multidetector CT imaging of the head and cervical spine was performed following the standard protocol without intravenous contrast. Multiplanar CT image reconstructions of the cervical spine were also generated. COMPARISON:  Brain MRI 02/17/2019, cervical spine MRI 02/18/2019 FINDINGS: CT HEAD FINDINGS Brain: No evidence of acute infarction, hemorrhage, hydrocephalus, extra-axial collection or mass lesion/mass effect. Moderate brain parenchymal volume loss and deep white matter microangiopathy. Vascular: Calcific atherosclerotic disease of the intra cavernous carotid arteries. Skull: Normal. Negative for fracture or focal lesion. Sinuses/Orbits: No acute finding. Other: None CT CERVICAL SPINE FINDINGS Alignment: Normal. Skull base and vertebrae: No acute fracture. No primary bone lesion or focal pathologic process. Soft tissues and spinal canal: No prevertebral fluid or swelling. No visible canal hematoma. Disc levels:  Multilevel osteoarthritic changes. Upper chest: Negative. Other: IMPRESSION: 1. No acute intracranial abnormality. 2. Atrophy, chronic microvascular disease. 3. No evidence of acute traumatic injury to cervical spine. 4. Multilevel osteoarthritic changes of the cervical spine. Electronically Signed   By: Ted Mcalpine M.D.   On: 03/17/2019 13:28   US Renal  Result Date: 04/04/2019 CLINICAL DATA:  Acute kidney injury EXAM:  RENAL / URINARY TRACT ULTRASOUND COMPLETE COMPARISON:  05/19/2017 CT abdomen/pelvis. FINDINGS: Right Kidney: Renal measurements: 10.0 x 4.9 x 5.0 cm = volume: 129 mL. No hydronephrosis. Mildly echogenic and mildly atrophic right renal parenchyma. No right renal mass. Left Kidney: Renal measurements: 12.7 x 6.1 x 6.2 cm = volume: 251 mL. No hydronephrosis. Mildly echogenic renal parenchyma, normal in thickness. Simple appearing 2.2 x 1.9 x 1.9 cm interpolar left renal cyst. Bladder: Appears normal for degree of bladder  distention. Ureteral jets are not visualized in the bladder on color Doppler. IMPRESSION: 1. No hydronephrosis. 2. Asymmetric right renal atrophy. 3. Echogenic kidneys, indicative of nonspecific renal parenchymal disease of uncertain chronicity. 4. Simple appearing left renal cyst. 5. Normal bladder. Electronically Signed   By: Delbert Phenix M.D.   On: 04/04/2019 12:20   Mr Hip Left Wo Contrast  Result Date: 03/19/2019 CLINICAL DATA:  Left hip pain and difficulty standing. Injury history is uncertain. EXAM: MR OF THE LEFT HIP WITHOUT CONTRAST TECHNIQUE: Multiplanar, multisequence MR imaging was performed. No intravenous contrast was administered. COMPARISON:  Plain films left hip 03/17/2019. FINDINGS: Bones: No fracture, dislocation or focal lesion. No worrisome lesion. Lower lumbar spondylosis with associated degenerative endplate signal change is noted. Articular cartilage and labrum Articular cartilage:  Appears preserved. Labrum:  The anterior, superior labrum is degenerated. Joint or bursal effusion Joint effusion:  None. Bursae: Negative. Muscles and tendons Muscles and tendons: The patient has a complete tear of the left iliopsoas tendon from the lesser trochanter. The tendon is retracted 3-4 cm. Edema is seen about the distal tendon and tracks into the muscle belly most consistent with acute or early subacute tear. There is also some edema in the left adductor magnus compatible with strain without tear. Tendinosis of the hamstrings at their origins is worse on the left. Partial tear of the left hamstrings off the ischial tuberosity measures 1.4 cm AP with little to no retraction. Other findings Miscellaneous: Imaged intrapelvic contents demonstrate no acute abnormality. IMPRESSION: Negative for fracture or dislocation. Complete tear of the left ileus psoas tendon from the lesser tuberosity with 3-4 cm of retraction appears acute or early subacute. Chronic appearing tendinosis and partial tear of the left  hamstrings from their origin. Edema in the left adductor magnus compatible with strain without tear. Electronically Signed   By: Drusilla Kanner M.D.   On: 03/19/2019 13:22   Nm Pulmonary Perf And Vent  Result Date: 04/05/2019 CLINICAL DATA:  Hypoxia, chest pain EXAM: NUCLEAR MEDICINE PERFUSION LUNG SCAN TECHNIQUE: Perfusion images were obtained in multiple projections after intravenous injection of Tc-31m MAA. RADIOPHARMACEUTICALS:  4.5 mCi Tc85m MAA IV COMPARISON:  None. FINDINGS: Perfusion: No wedge shaped peripheral perfusion defects to suggest acute pulmonary embolism. IMPRESSION: No scintigraphic evidence of pulmonary embolus. Electronically Signed   By: Elige Ko   On: 04/05/2019 17:23   Dg Humerus Left  Result Date: 03/31/2019 CLINICAL DATA:  Initial evaluation for acute trauma, fall. EXAM: LEFT HUMERUS - 2+ VIEW COMPARISON:  None. FINDINGS: There is no evidence of fracture or other focal bone lesions. Mild degenerative changes noted about the shoulder and elbow. Soft tissues are unremarkable. IMPRESSION: No acute osseous abnormality about the left humerus. Electronically Signed   By: Rise Mu M.D.   On: 03/31/2019 02:06   Ct Angio Chest/abd/pel For Dissection W And/or W/wo  Result Date: 03/17/2019 CLINICAL DATA:  83 year old female complaining of back pain. Recent history of fall. EXAM: CT ANGIOGRAPHY CHEST, ABDOMEN AND PELVIS TECHNIQUE:  Multidetector CT imaging through the chest, abdomen and pelvis was performed using the standard protocol during bolus administration of intravenous contrast. Multiplanar reconstructed images and MIPs were obtained and reviewed to evaluate the vascular anatomy. CONTRAST:  OMNIPAQUE IOHEXOL 350 MG/ML SOLN COMPARISON:  No prior chest CT.  CT abdomen and pelvis 05/19/2017. FINDINGS: CTA CHEST FINDINGS Cardiovascular: Heart size is mildly enlarged. There is no significant pericardial fluid, thickening or pericardial calcification. There is aortic  atherosclerosis, as well as atherosclerosis of the great vessels of the mediastinum and the coronary arteries, including calcified atherosclerotic plaque in the left main, left anterior descending, left circumflex and right coronary arteries. Status post median sternotomy for CABG including LIMA to the LAD. On precontrast images there is no crescentic high attenuation associated with the wall of the thoracic aorta to suggest acute intramural hemorrhage. No aortic aneurysm or dissection. Calcifications of the aortic valve and mitral annulus. Mediastinum/Nodes: No pathologically enlarged mediastinal or hilar lymph nodes. Esophagus is normal in appearance. No axillary lymphadenopathy. Lungs/Pleura: Scattered areas of linear scarring are noted in the lungs bilaterally, most evident near the lung apices and in the lung bases. Areas of mild dependent subsegmental atelectasis also noted in the lower lobes of the lungs bilaterally. No acute consolidative airspace disease. No pleural effusions. No definite suspicious appearing pulmonary nodules or masses are noted. Musculoskeletal: Median sternotomy wires. There are no aggressive appearing lytic or blastic lesions noted in the visualized portions of the skeleton. Review of the MIP images confirms the above findings. CTA ABDOMEN AND PELVIS FINDINGS VASCULAR Aorta: Normal caliber aorta without aneurysm, dissection, vasculitis or significant stenosis. Celiac: Patent without evidence of aneurysm, dissection, vasculitis or significant stenosis. SMA: Patent without evidence of aneurysm, dissection, vasculitis or significant stenosis. Renals: Both renal arteries are patent without evidence of aneurysm, dissection, vasculitis, fibromuscular dysplasia or significant stenosis. IMA: Patent without evidence of aneurysm, dissection, vasculitis or significant stenosis. Inflow: Patent without evidence of aneurysm, dissection, vasculitis or significant stenosis. Veins: No obvious venous  abnormality within the limitations of this arterial phase study. Review of the MIP images confirms the above findings. NON-VASCULAR Hepatobiliary: No suspicious cystic or solid hepatic lesions. Status post cholecystectomy. No intrahepatic biliary ductal dilatation. Common bile duct measures 7 mm in the porta hepatis, within normal limits for this post cholecystectomy patient. Pancreas: No pancreatic mass. No pancreatic ductal dilatation. No pancreatic or peripancreatic fluid or inflammatory changes. Spleen: Unremarkable. Adrenals/Urinary Tract: Low-attenuation lesions in both kidneys, compatible with simple cysts, largest of which measures 2.1 cm in the upper pole the left kidney. Multifocal cortical thinning in the right kidney, presumably chronic scarring. No definite suspicious renal lesions. No hydroureteronephrosis. Urinary bladder is normal in appearance. Bilateral adrenal glands are normal in appearance. Stomach/Bowel: Normal appearance of the stomach. No pathologic dilatation of small bowel or colon. The appendix is not confidently identified and may be surgically absent. Regardless, there are no inflammatory changes noted adjacent to the cecum to suggest the presence of an acute appendicitis at this time. Lymphatic: No lymphadenopathy noted in the abdomen or pelvis. Reproductive: Status post hysterectomy. Ovaries are not confidently identified may be surgically absent or trophic. Other: No significant volume of ascites.  No pneumoperitoneum. Musculoskeletal: Median sternotomy wires. No acute displaced fractures or aggressive appearing lytic or blastic lesions are noted in the visualized portions of the skeleton. Review of the MIP images confirms the above findings. IMPRESSION: 1. No acute findings are noted in the chest, abdomen or pelvis to account for the  patient's symptoms. Specifically, no evidence of acute aortic syndrome. 2. Mild cardiomegaly. 3. Aortic atherosclerosis, in addition to left main and 3  vessel coronary artery disease. Status post median sternotomy for CABG including LIMA to the LAD. 4. There are calcifications of the aortic valve and mitral annulus. Echocardiographic correlation for evaluation of potential valvular dysfunction may be warranted if clinically indicated. 5. Additional incidental findings, as above. Aortic Atherosclerosis (ICD10-I70.0). Electronically Signed   By: Trudie Reed M.D.   On: 03/17/2019 15:50   Dg Femur Min 2 Views Left  Result Date: 03/31/2019 CLINICAL DATA:  Initial evaluation for acute trauma, fall. EXAM: LEFT FEMUR 2 VIEWS COMPARISON:  None. FINDINGS: No acute fracture or dislocation. Osseous mineralization normal. Mild osteoarthritic changes about the hip and knee. No soft tissue abnormality. Prominent atherosclerotic change noted. IMPRESSION: No acute osseous abnormality about the left femur. Electronically Signed   By: Rise Mu M.D.   On: 03/31/2019 01:55   Dg Hips Bilat W Or Wo Pelvis 5 Views  Result Date: 03/17/2019 CLINICAL DATA:  Bilateral hip pain EXAM: DG HIP (WITH OR WITHOUT PELVIS) 5+V BILAT COMPARISON:  None. FINDINGS: There is no evidence of hip fracture or dislocation. There is no evidence of arthropathy or other focal bone abnormality. Peripheral vascular atherosclerotic disease. IMPRESSION: No acute osseous injury of the bilateral hips. Electronically Signed   By: Elige Ko   On: 03/17/2019 13:34    Vassie Loll, MD  [facility] Pager [phone]  04/07/2019, 4:00 PM   LOS: 3 days

## 2019-04-08 ENCOUNTER — Other Ambulatory Visit: Payer: Self-pay | Admitting: Adult Health

## 2019-04-08 LAB — GLUCOSE, CAPILLARY
Glucose-Capillary: 129 mg/dL — ABNORMAL HIGH (ref 70–99)
Glucose-Capillary: 142 mg/dL — ABNORMAL HIGH (ref 70–99)
Glucose-Capillary: 114 mg/dL — ABNORMAL HIGH (ref 70–99)
Glucose-Capillary: 148 mg/dL — ABNORMAL HIGH (ref 70–99)

## 2019-04-08 NOTE — Progress Notes (Signed)
PROGRESS NOTE  [name] [name] ZOX:[id number] DOB: 12-Nov-1936 DOA: 04/04/2019 PCP: Oval Linsey, MD   Brief History:  83 y.o.femalewith medical history ofdiabetes mellitus type 2, chronic diastolic CHF, stroke, COPD, hypothyroidism, hypertension, coronary disease, hyperlipidemia, OSA presenting with altered mental status. The patient has had numerous hospitalizations with similar presentation. Most recently, the patient was discharged from the hospital after a stay from 03/17/2019 through 03/20/2019 for acute metabolic encephalopathy that was multifactorial including UTI and hypercarbia. In addition, the patient had another admission from 02/22/2019 through 02/26/2019 for encephalopathy secondary to hypoglycemia and UTI. Since her discharge from the hospital on 03/20/2019, the patient was discharged home to live with her daughter. The patient had refused to go to a skilled nursing facility at that time. Initially, the patient was able to stand and take a few steps with a walker. However over the past week, the patient's family has noted increasing generalized weakness, decreased oral intake, and more somnolence. On 03/29/2019, the patient was complaining of some dysuria. Her home health nurse called the patient's PCP. The patient was prescribed ciprofloxacin. Since that date, the patient has had increasing somnolence which was significantly worse on 04/03/2019. During this past week, the patient has had significantly decreased oral intake. According to the patient's family, the patient was somnolent and slept most of the day. When she woke up on 04/04/2019, the patient complained of chest discomfort. As result, EMS was activated. During this past week, the patient has not had any recurrent discomfort or complained of any chronic worsening cough or shortness of breath. The patient did have a low-grade temperature of 100.2 F on 04/03/2019. There is been no complaints of headaches,  nausea, vomiting, diarrhea, abdominal pain. According to the patient's daughter, they have been compliant with social distancing in the context of the coronavirus pandemic. There is been no sick contacts. Other than the ciprofloxacin, there has not been any new medications. In the emergency department, the patient had a low-grade temperature of 99.7 F. She was hemodynamically stable. Oxygen saturations were 92--95% on room air. BMP showed a serum creatinine of 3.48 which was significantly above her usual baseline. WBC was 10.2 with hemoglobin 11.3 and platelets 270,000. Chest x-ray showed poor inspiration withoutconsolidation or edema. Renal ultrasound was negative for hydronephrosis but showed right renal atrophy. CT of the brain was negative for acute findings. Lactic acid was 1.8. Urinalysis showed 21-50 WBC. The patient was given a liter bolus of normal saline and started on ceftriaxone.  Assessment/Plan: Acute metabolic encephalopathy -Secondary to acute kidney injury and UTI, ciprofloxacin as well as other hypnotic medications including oxycodone/gabapentin which have been discontinued -At baseline, the patient has poor functional status requiring assistance with all aspects of activities of daily living -She has been essentially bedbound for the past week -After further discussion with family and following recommendations by palliative care patient has been transition to full comfort care. -All medications and treatment other than to achieve comfort has been discontinued -Continue the use of Celexa daily, started on oxycodone and Ativan as needed.  Acute kidney injury -Secondary to volume depletion and hemodynamic changes (low BPs) -baseline creatinine 0.7-0.8 -serum creatinine peaked 3.64 -Continue avoiding nephrotoxic agents. -Renal ultrasound negative for hydronephrosis -IV fluid has been discontinued. -Plan of care is for comfort.  Pyuria -Concerning for UTI on  admission. -She received treatment with Rocephin for 3 days; no significant improvement or changes in condition. -After discussing with family decision has  been made to pursuit only comfort care -Antibiotic has been discontinued.  Hypoxia/Acute respiratory failure with hypoxia -due to OSA/OHS and hypoventilation -ABG--7.305/47/82/22 on 2L -oxygen saturation 92-94% on RA; desaturates during sleep -checked D-dimer--0.95 -V/Q scan neg -Continue 2 L oxygen supplementation as part of comfort care.  Essential hypertension -We will discontinue antihypertensive medication in the setting of full comfort care.  Diabetes mellitus type 2, uncontrolled with hyperglycemia/hypoglycemia -Given the patient decreased oral intake and following decision for full comfort only will discontinue insulin therapy.   -Last A1c 8.5.   -With patient has demonstrated intermittent episode of hypoglycemia in the setting of poor oral intake.  Failure to thrive in adult -The patient's daughter relates that functional and cognitive decline over the past 4 to 5 months has been steadily and progressive. -Goals of care discussed--patient is DNR/DNI -PT evaluation--recommended 24 hour care -Serum B12--724 -Folic acid--20.6 -ammonia < 9 -03/18/2019 TSH 0.558 -patient continues to have poor po intake resulting in hypoglycemia intermittently.  Hyperlipidemia -Statin has been discontinued -Plan of care is for comfort  Hypothyroidism -levothyroxine discontinued -Plan of care is full comfort.  History of multiple strokes -Prior to admission patient was using aspirin and Plavix for secondary prevention -Strokes has been associated with severe right PCA stenosis. -Following discussion with family and current patient's condition; aspirin and Plavix has been discontinued and focus on comfort care only.   Goals of Care. -DNR/DNI -Appreciate assistance and recommendation by palliative care medicine -Given overall  decline, failure to thrive and poor functional state decision has been made to pursuit for comfort care.   -Hospice has been involved and will follow recommendation -For now continue to focus on full comfort care and comfort feeding -not invasive or heroic measures   Stage 2 sacral decubitus -present prior to admission -Continue preventive measures.   Disposition Plan:   SNF vs residential hospice; waiting on final decision from Hospice. SW assisting with placement and discharges.  Family Communication:  No Family at bedside  Consultants:  Palliative medicine  Code Status:  DNR  DVT Prophylaxis:  [place] Heparin   Procedures: As Listed in Progress Note Above  Antibiotics: None   Subjective: Afebrile, using nasal cannula supplementation; no chest pain, no nausea, no vomiting, complaining of lower back pain and knees discomfort.  Objective: Vitals:   04/07/19 1931 04/07/19 2108 04/08/19 0413 04/08/19 0530  BP:  (!) 149/83  106/85  Pulse:  86  80  Resp:  18  18  Temp:  98.4 F (36.9 C)  99.4 F (37.4 C)  TempSrc:  Oral  Oral  SpO2: (!) 85% 99%  98%  Weight:   86.7 kg   Height:        Intake/Output Summary (Last 24 hours) at 04/08/2019 1107 Last data filed at 04/08/2019 0820 Gross per 24 hour  Intake 340 ml  Output 1900 ml  Net -1560 ml   Weight change:  Exam: General exam: Alert, awake, oriented x 2; afebrile, denies chest pain, no nausea or vomiting.  Patient was able to answer questions appropriately.  Reports poor appetite and continue complaining of back pain and pain in her knees. Respiratory system: Fair movement bilaterally; continue using nasal cannula supplementation.  Normal respiratory effort.  Positive scattered rhonchi at, no crackles. Cardiovascular system:RRR. No murmurs, rubs, gallops. Gastrointestinal system: Abdomen is nondistended, soft and nontender. No organomegaly or masses felt. Normal bowel sounds heard. Central nervous system: Alert  and oriented. No focal neurological deficits. Extremities: No cyanosis, no clubbing; trace  edema bilaterally appreciated. Skin: No rashes, lesions or ulcers; no petechiae.  Stage II decubitus pressure injury present prior to admission; no signs of superimposed infection appreciated. Psychiatry: Stable mood.  No suicidal ideation or hallucinations.   Data Reviewed: I have personally reviewed following labs and imaging studies  Basic Metabolic Panel: Recent Labs  Lab 04/04/19 0942 04/05/19 0458 04/06/19 0528  NA 139 141 143  K 4.8 4.5 4.7  CL 103 109 111  CO2 25 22 21*  GLUCOSE 146* 118* 126*  BUN 26* 26* 26*  CREATININE 3.48* 3.64* 3.56*  CALCIUM 9.1 8.2* 8.7*  MG  --  1.9  --    Liver Function Tests: Recent Labs  Lab 04/04/19 0942  AST 19  ALT 13  ALKPHOS 53  BILITOT 1.0  PROT 6.3*  ALBUMIN 3.5    Recent Labs  Lab 04/05/19 1214  AMMONIA <9*   Coagulation Profile: Recent Labs  Lab 04/04/19 0942  INR 1.2   CBC: Recent Labs  Lab 04/04/19 0942 04/05/19 0458 04/06/19 0528  WBC 10.2 7.7 7.9  NEUTROABS 7.3  --   --   HGB 11.3* 9.5* 11.2*  HCT 38.1 33.3* 39.3  MCV 84.9 86.5 86.6  PLT 270 244 248   Cardiac Enzymes: Recent Labs  Lab 04/04/19 0944 04/04/19 1529 04/04/19 2228 04/06/19 0528  CKTOTAL  --  98  --  88  TROPONINI <0.03 <0.03 <0.03  --    BNP: Invalid input(s): POCBNP CBG: Recent Labs  Lab 04/07/19 0726 04/07/19 1107 04/07/19 1607 04/07/19 2108 04/08/19 0745  GLUCAP 132* 176* 130* 141* 129*   Urine analysis:    Component Value Date/Time   COLORURINE YELLOW 04/04/2019 0932   APPEARANCEUR HAZY (A) 04/04/2019 0932   LABSPEC 1.006 04/04/2019 0932   PHURINE 5.0 04/04/2019 0932   GLUCOSEU NEGATIVE 04/04/2019 0932   HGBUR NEGATIVE 04/04/2019 0932   BILIRUBINUR NEGATIVE 04/04/2019 0932   KETONESUR NEGATIVE 04/04/2019 0932   PROTEINUR NEGATIVE 04/04/2019 0932   UROBILINOGEN 0.2 08/29/2015 1842   NITRITE NEGATIVE 04/04/2019 0932    LEUKOCYTESUR MODERATE (A) 04/04/2019 0932    Recent Results (from the past 240 hour(s))  Culture, blood (Routine x 2)     Status: None (Preliminary result)   Collection Time: 04/04/19  9:32 AM  Result Value Ref Range Status   Specimen Description   Final    BLOOD LEFT HAND Blood Culture results may not be optimal due to an inadequate volume of blood received in culture bottles BOTTLES DRAWN AEROBIC AND ANAEROBIC   Special Requests NONE  Final   Culture   Final    NO GROWTH 4 DAYS Performed at [facility], [address], [place], [place] [address]    Report Status PENDING  Incomplete  Urine Culture     Status: None   Collection Time: 04/04/19  9:32 AM  Result Value Ref Range Status   Specimen Description   Final    URINE, RANDOM Performed at [facility], [address], [place], [place] [address]    Special Requests   Final    NONE Performed at [facility], [address], [place], [place] [address]    Culture   Final    NO GROWTH Performed at [facility], [address] [place], [place] [address]    Report Status 04/05/2019 FINAL  Final  Culture, blood (Routine x 2)     Status: None (Preliminary result)   Collection Time: 04/04/19  9:37 AM  Result  Value Ref Range Status   Specimen Description   Final    BLOOD RIGHT ANTECUBITAL Blood Culture results may not be optimal due to an excessive volume of blood received in culture bottles BOTTLES DRAWN AEROBIC AND ANAEROBIC   Special Requests NONE  Final   Culture   Final    NO GROWTH 4 DAYS Performed at [facility], [address] [place], [place] [address]    Report Status PENDING  Incomplete  SARS Coronavirus 2 [facility] order, Performed in [facility] hospital lab)     Status: None   Collection Time: 04/06/19  1:13 PM  Result Value Ref Range Status   SARS Coronavirus 2 NEGATIVE NEGATIVE Final    Comment: (NOTE) If result is NEGATIVE SARS-CoV-2 target nucleic acids are NOT DETECTED. The SARS-CoV-2 RNA is generally  detectable in upper and lower  respiratory specimens during the acute phase of infection. The lowest  concentration of SARS-CoV-2 viral copies this assay can detect is 250  copies / mL. A negative result does not preclude SARS-CoV-2 infection  and should not be used as the sole basis for treatment or other  patient management decisions.  A negative result may occur with  improper specimen collection / handling, submission of specimen other  than nasopharyngeal swab, presence of viral mutation(s) within the  areas targeted by this assay, and inadequate number of viral copies  (<250 copies / mL). A negative result must be combined with clinical  observations, patient history, and epidemiological information. If result is POSITIVE SARS-CoV-2 target nucleic acids are DETECTED. The SARS-CoV-2 RNA is generally detectable in upper and lower  respiratory specimens dur ing the acute phase of infection.  Positive  results are indicative of active infection with SARS-CoV-2.  Clinical  correlation with patient history and other diagnostic information is  necessary to determine patient infection status.  Positive results do  not rule out bacterial infection or co-infection with other viruses. If result is PRESUMPTIVE POSTIVE SARS-CoV-2 nucleic acids MAY BE PRESENT.   A presumptive positive result was obtained on the submitted specimen  and confirmed on repeat testing.  While 2019 novel coronavirus  (SARS-CoV-2) nucleic acids may be present in the submitted sample  additional confirmatory testing may be necessary for epidemiological  and / or clinical management purposes  to differentiate between  SARS-CoV-2 and other Sarbecovirus currently known to infect humans.  If clinically indicated additional testing with an alternate test  methodology [id number]) is advised. The SARS-CoV-2 RNA is generally  detectable in upper and lower respiratory sp ecimens during the acute  phase of infection. The  expected result is Negative. Fact Sheet for Patients:  BoilerBrush.com.cy Fact Sheet for Healthcare Providers: https://pope.com/ This test is not yet approved or cleared by the [place] FDA and has been authorized for detection and/or diagnosis of SARS-CoV-2 by FDA under an Emergency Use Authorization (EUA).  This EUA will remain in effect (meaning this test can be used) for the duration of the COVID-19 declaration under Section 564(b)(1) of the Act, 21 U.S.C. section 360bbb-3(b)(1), unless the authorization is terminated or revoked sooner. Performed at [facility], [address], [place], [place] [address]      Scheduled Meds:  citalopram  20 mg Oral Daily   Continuous Infusions:  sodium chloride Stopped (04/06/19 1856)    Procedures/Studies: Dg Chest 1 View  Result Date: 04/05/2019 CLINICAL DATA:  83 year old female with a history of hypoxia EXAM: CHEST  1 VIEW COMPARISON:  04/04/2019, 03/17/2019, CT  chest 03/17/2019 FINDINGS: Cardiomediastinal silhouette unchanged in size and contour with surgical changes of median sternotomy and CABG. Low lung volumes. Interlobular septal thickening with coarsened interstitial markings. Similar appearance of linear opacity on the right and linear opacities at the left lung base. Blunting of the costophrenic angle bilaterally with meniscus on the right. IMPRESSION: Similar appearance of low lung volumes with atelectasis and evidence of edema. Small right pleural effusion and associated atelectasis/consolidation. Surgical changes of median sternotomy and CABG. Electronically Signed   By: Gilmer MorJaime  Wagner D.O.   On: 04/05/2019 17:17   Dg Chest 1 View  Result Date: 03/17/2019 CLINICAL DATA:  Altered mental status. EXAM: CHEST  1 VIEW COMPARISON:  02/12/2019 FINDINGS: Postsurgical changes from CABG. The cardiac silhouette is enlarged. There is an apparent widening of the mediastinum, when  compared to patient's prior radiographs. There is no evidence of focal airspace consolidation, or pneumothorax. Possible right pleural effusion. Osseous structures are without acute abnormality. Soft tissues are grossly normal. IMPRESSION: 1. Apparent widening of the mediastinum, when compared to patient's prior radiographs. This may be due to poor inspiratory effort, however vascular anomaly can not be excluded. Please correlate to patient's symptomatology. If the patient displays signs of acute aortic syndrome, then CT angiogram of the chest should be performed. Otherwise PA and lateral radiograph the chest may be considered. 2. Possible right pleural effusion. These results were called by telephone at the time of interpretation on 03/17/2019 at 1:43 pm to Dr. Samuel JesterKATHLEEN MCMANUS , who verbally acknowledged these results. Electronically Signed   By: Ted Mcalpineobrinka  Dimitrova M.D.   On: 03/17/2019 13:45   Dg Chest 2 View  Result Date: 04/04/2019 CLINICAL DATA:  83 year old with acute mental status changes that began yesterday. Patient is currently undergoing treatment for a urinary tract infection. Acute chest pain that began earlier today. EXAM: CHEST - 2 VIEW COMPARISON:  CT chest and chest x-ray 03/17/2019 and earlier. FINDINGS: AP ERECT and LATERAL images were obtained. Markedly suboptimal inspiration. Prior sternotomy for CABG. Cardiac silhouette mildly enlarged, unchanged. Pulmonary vascularity normal without evidence of pulmonary edema. Linear scarring scattered throughout both lungs, unchanged. Lungs otherwise clear. No visible pleural effusions. Prior ORIF of a LEFT humerus fracture. IMPRESSION: Markedly suboptimal inspiration. Stable mild cardiomegaly. No acute cardiopulmonary disease. Electronically Signed   By: Hulan Saashomas  Lawrence M.D.   On: 04/04/2019 11:11   Dg Pelvis 1-2 Views  Result Date: 03/31/2019 CLINICAL DATA:  Fall 2 days ago with left pelvis/hip and femur pain. Recent fall leading to iliopsoas  tendon tear. EXAM: PELVIS - 1-2 VIEW COMPARISON:  Pelvis and hip radiographs 03/17/2019. Pelvic MRI 03/19/2019 FINDINGS: The cortical margins of the bony pelvis are intact. No fracture. Pubic symphysis and sacroiliac joints are congruent. Both femoral heads are well-seated in the respective acetabula. There are vascular calcifications. IMPRESSION: No evidence of pelvic fracture. Electronically Signed   By: Narda RutherfordMelanie  Sanford M.D.   On: 03/31/2019 02:06   Ct Head Wo Contrast  Result Date: 04/04/2019 CLINICAL DATA:  Altered level of consciousness. EXAM: CT HEAD WITHOUT CONTRAST TECHNIQUE: Contiguous axial images were obtained from the base of the skull through the vertex without intravenous contrast. COMPARISON:  CT head 03/31/2019 FINDINGS: Brain: Atrophy and chronic microvascular ischemic changes are stable. No new findings. No acute infarct, hemorrhage, or mass. Vascular: Negative for hyperdense vessel Skull: Negative Sinuses/Orbits: Small air-fluid level posterior ethmoid sinus on the right. Remaining sinuses clear. Bilateral cataract surgery. Other: None IMPRESSION: No acute abnormality and no change from the  recent study. Electronically Signed   By: Marlan Palau M.D.   On: 04/04/2019 10:57   Ct Head Wo Contrast  Result Date: 03/31/2019 CLINICAL DATA:  Initial evaluation for acute trauma, recent fall. EXAM: CT HEAD WITHOUT CONTRAST TECHNIQUE: Contiguous axial images were obtained from the base of the skull through the vertex without intravenous contrast. COMPARISON:  Prior CT from 03/17/2019. FINDINGS: Brain: Generalized age-related cerebral atrophy with moderate chronic microvascular ischemic disease. No acute intracranial hemorrhage. No acute large vessel territory infarct. No mass lesion, midline shift or mass effect. No hydrocephalus. No extra-axial fluid collection. Vascular: No hyperdense vessel. Scattered vascular calcifications noted within the carotid siphons. Skull: Scalp soft tissues demonstrate  no acute finding. Calvarium intact. Sinuses/Orbits: Globes and orbital soft tissues within normal limits. Mild layering opacity within the right sphenoid sinus. Paranasal sinuses are otherwise clear. No mastoid effusion. Other: None. IMPRESSION: 1. No acute intracranial abnormality. 2. Generalized age-related cerebral atrophy with moderate chronic small vessel ischemic disease. 3. Mild right sphenoid sinusitis. Electronically Signed   By: Rise Mu M.D.   On: 03/31/2019 02:00   Ct Head Wo Contrast  Result Date: 03/17/2019 CLINICAL DATA:  Explain confusion. EXAM: CT HEAD WITHOUT CONTRAST CT CERVICAL SPINE WITHOUT CONTRAST TECHNIQUE: Multidetector CT imaging of the head and cervical spine was performed following the standard protocol without intravenous contrast. Multiplanar CT image reconstructions of the cervical spine were also generated. COMPARISON:  Brain MRI 02/17/2019, cervical spine MRI 02/18/2019 FINDINGS: CT HEAD FINDINGS Brain: No evidence of acute infarction, hemorrhage, hydrocephalus, extra-axial collection or mass lesion/mass effect. Moderate brain parenchymal volume loss and deep white matter microangiopathy. Vascular: Calcific atherosclerotic disease of the intra cavernous carotid arteries. Skull: Normal. Negative for fracture or focal lesion. Sinuses/Orbits: No acute finding. Other: None CT CERVICAL SPINE FINDINGS Alignment: Normal. Skull base and vertebrae: No acute fracture. No primary bone lesion or focal pathologic process. Soft tissues and spinal canal: No prevertebral fluid or swelling. No visible canal hematoma. Disc levels:  Multilevel osteoarthritic changes. Upper chest: Negative. Other: IMPRESSION: 1. No acute intracranial abnormality. 2. Atrophy, chronic microvascular disease. 3. No evidence of acute traumatic injury to cervical spine. 4. Multilevel osteoarthritic changes of the cervical spine. Electronically Signed   By: Ted Mcalpine M.D.   On: 03/17/2019 13:28   Ct  Cervical Spine Wo Contrast  Result Date: 03/17/2019 CLINICAL DATA:  Explain confusion. EXAM: CT HEAD WITHOUT CONTRAST CT CERVICAL SPINE WITHOUT CONTRAST TECHNIQUE: Multidetector CT imaging of the head and cervical spine was performed following the standard protocol without intravenous contrast. Multiplanar CT image reconstructions of the cervical spine were also generated. COMPARISON:  Brain MRI 02/17/2019, cervical spine MRI 02/18/2019 FINDINGS: CT HEAD FINDINGS Brain: No evidence of acute infarction, hemorrhage, hydrocephalus, extra-axial collection or mass lesion/mass effect. Moderate brain parenchymal volume loss and deep white matter microangiopathy. Vascular: Calcific atherosclerotic disease of the intra cavernous carotid arteries. Skull: Normal. Negative for fracture or focal lesion. Sinuses/Orbits: No acute finding. Other: None CT CERVICAL SPINE FINDINGS Alignment: Normal. Skull base and vertebrae: No acute fracture. No primary bone lesion or focal pathologic process. Soft tissues and spinal canal: No prevertebral fluid or swelling. No visible canal hematoma. Disc levels:  Multilevel osteoarthritic changes. Upper chest: Negative. Other: IMPRESSION: 1. No acute intracranial abnormality. 2. Atrophy, chronic microvascular disease. 3. No evidence of acute traumatic injury to cervical spine. 4. Multilevel osteoarthritic changes of the cervical spine. Electronically Signed   By: Ted Mcalpine M.D.   On: 03/17/2019 13:28  US Renal  Result Date: 04/04/2019 CLINICAL DATA:  Acute kidney injury EXAM: RENAL / URINARY TRACT ULTRASOUND COMPLETE COMPARISON:  05/19/2017 CT abdomen/pelvis. FINDINGS: Right Kidney: Renal measurements: 10.0 x 4.9 x 5.0 cm = volume: 129 mL. No hydronephrosis. Mildly echogenic and mildly atrophic right renal parenchyma. No right renal mass. Left Kidney: Renal measurements: 12.7 x 6.1 x 6.2 cm = volume: 251 mL. No hydronephrosis. Mildly echogenic renal parenchyma, normal in thickness.  Simple appearing 2.2 x 1.9 x 1.9 cm interpolar left renal cyst. Bladder: Appears normal for degree of bladder distention. Ureteral jets are not visualized in the bladder on color Doppler. IMPRESSION: 1. No hydronephrosis. 2. Asymmetric right renal atrophy. 3. Echogenic kidneys, indicative of nonspecific renal parenchymal disease of uncertain chronicity. 4. Simple appearing left renal cyst. 5. Normal bladder. Electronically Signed   By: Delbert Phenix M.D.   On: 04/04/2019 12:20   Mr Hip Left Wo Contrast  Result Date: 03/19/2019 CLINICAL DATA:  Left hip pain and difficulty standing. Injury history is uncertain. EXAM: MR OF THE LEFT HIP WITHOUT CONTRAST TECHNIQUE: Multiplanar, multisequence MR imaging was performed. No intravenous contrast was administered. COMPARISON:  Plain films left hip 03/17/2019. FINDINGS: Bones: No fracture, dislocation or focal lesion. No worrisome lesion. Lower lumbar spondylosis with associated degenerative endplate signal change is noted. Articular cartilage and labrum Articular cartilage:  Appears preserved. Labrum:  The anterior, superior labrum is degenerated. Joint or bursal effusion Joint effusion:  None. Bursae: Negative. Muscles and tendons Muscles and tendons: The patient has a complete tear of the left iliopsoas tendon from the lesser trochanter. The tendon is retracted 3-4 cm. Edema is seen about the distal tendon and tracks into the muscle belly most consistent with acute or early subacute tear. There is also some edema in the left adductor magnus compatible with strain without tear. Tendinosis of the hamstrings at their origins is worse on the left. Partial tear of the left hamstrings off the ischial tuberosity measures 1.4 cm AP with little to no retraction. Other findings Miscellaneous: Imaged intrapelvic contents demonstrate no acute abnormality. IMPRESSION: Negative for fracture or dislocation. Complete tear of the left ileus psoas tendon from the lesser tuberosity with  3-4 cm of retraction appears acute or early subacute. Chronic appearing tendinosis and partial tear of the left hamstrings from their origin. Edema in the left adductor magnus compatible with strain without tear. Electronically Signed   By: Drusilla Kanner M.D.   On: 03/19/2019 13:22   Nm Pulmonary Perf And Vent  Result Date: 04/05/2019 CLINICAL DATA:  Hypoxia, chest pain EXAM: NUCLEAR MEDICINE PERFUSION LUNG SCAN TECHNIQUE: Perfusion images were obtained in multiple projections after intravenous injection of Tc-50m MAA. RADIOPHARMACEUTICALS:  4.5 mCi Tc7m MAA IV COMPARISON:  None. FINDINGS: Perfusion: No wedge shaped peripheral perfusion defects to suggest acute pulmonary embolism. IMPRESSION: No scintigraphic evidence of pulmonary embolus. Electronically Signed   By: Elige Ko   On: 04/05/2019 17:23   Dg Humerus Left  Result Date: 03/31/2019 CLINICAL DATA:  Initial evaluation for acute trauma, fall. EXAM: LEFT HUMERUS - 2+ VIEW COMPARISON:  None. FINDINGS: There is no evidence of fracture or other focal bone lesions. Mild degenerative changes noted about the shoulder and elbow. Soft tissues are unremarkable. IMPRESSION: No acute osseous abnormality about the left humerus. Electronically Signed   By: Rise Mu M.D.   On: 03/31/2019 02:06   Ct Angio Chest/abd/pel For Dissection W And/or W/wo  Result Date: 03/17/2019 CLINICAL DATA:  83 year old female complaining of back  pain. Recent history of fall. EXAM: CT ANGIOGRAPHY CHEST, ABDOMEN AND PELVIS TECHNIQUE: Multidetector CT imaging through the chest, abdomen and pelvis was performed using the standard protocol during bolus administration of intravenous contrast. Multiplanar reconstructed images and MIPs were obtained and reviewed to evaluate the vascular anatomy. CONTRAST:  OMNIPAQUE IOHEXOL 350 MG/ML SOLN COMPARISON:  No prior chest CT.  CT abdomen and pelvis 05/19/2017. FINDINGS: CTA CHEST FINDINGS Cardiovascular: Heart size is  mildly enlarged. There is no significant pericardial fluid, thickening or pericardial calcification. There is aortic atherosclerosis, as well as atherosclerosis of the great vessels of the mediastinum and the coronary arteries, including calcified atherosclerotic plaque in the left main, left anterior descending, left circumflex and right coronary arteries. Status post median sternotomy for CABG including LIMA to the LAD. On precontrast images there is no crescentic high attenuation associated with the wall of the thoracic aorta to suggest acute intramural hemorrhage. No aortic aneurysm or dissection. Calcifications of the aortic valve and mitral annulus. Mediastinum/Nodes: No pathologically enlarged mediastinal or hilar lymph nodes. Esophagus is normal in appearance. No axillary lymphadenopathy. Lungs/Pleura: Scattered areas of linear scarring are noted in the lungs bilaterally, most evident near the lung apices and in the lung bases. Areas of mild dependent subsegmental atelectasis also noted in the lower lobes of the lungs bilaterally. No acute consolidative airspace disease. No pleural effusions. No definite suspicious appearing pulmonary nodules or masses are noted. Musculoskeletal: Median sternotomy wires. There are no aggressive appearing lytic or blastic lesions noted in the visualized portions of the skeleton. Review of the MIP images confirms the above findings. CTA ABDOMEN AND PELVIS FINDINGS VASCULAR Aorta: Normal caliber aorta without aneurysm, dissection, vasculitis or significant stenosis. Celiac: Patent without evidence of aneurysm, dissection, vasculitis or significant stenosis. SMA: Patent without evidence of aneurysm, dissection, vasculitis or significant stenosis. Renals: Both renal arteries are patent without evidence of aneurysm, dissection, vasculitis, fibromuscular dysplasia or significant stenosis. IMA: Patent without evidence of aneurysm, dissection, vasculitis or significant stenosis.  Inflow: Patent without evidence of aneurysm, dissection, vasculitis or significant stenosis. Veins: No obvious venous abnormality within the limitations of this arterial phase study. Review of the MIP images confirms the above findings. NON-VASCULAR Hepatobiliary: No suspicious cystic or solid hepatic lesions. Status post cholecystectomy. No intrahepatic biliary ductal dilatation. Common bile duct measures 7 mm in the porta hepatis, within normal limits for this post cholecystectomy patient. Pancreas: No pancreatic mass. No pancreatic ductal dilatation. No pancreatic or peripancreatic fluid or inflammatory changes. Spleen: Unremarkable. Adrenals/Urinary Tract: Low-attenuation lesions in both kidneys, compatible with simple cysts, largest of which measures 2.1 cm in the upper pole the left kidney. Multifocal cortical thinning in the right kidney, presumably chronic scarring. No definite suspicious renal lesions. No hydroureteronephrosis. Urinary bladder is normal in appearance. Bilateral adrenal glands are normal in appearance. Stomach/Bowel: Normal appearance of the stomach. No pathologic dilatation of small bowel or colon. The appendix is not confidently identified and may be surgically absent. Regardless, there are no inflammatory changes noted adjacent to the cecum to suggest the presence of an acute appendicitis at this time. Lymphatic: No lymphadenopathy noted in the abdomen or pelvis. Reproductive: Status post hysterectomy. Ovaries are not confidently identified may be surgically absent or trophic. Other: No significant volume of ascites.  No pneumoperitoneum. Musculoskeletal: Median sternotomy wires. No acute displaced fractures or aggressive appearing lytic or blastic lesions are noted in the visualized portions of the skeleton. Review of the MIP images confirms the above findings. IMPRESSION: 1. No acute  findings are noted in the chest, abdomen or pelvis to account for the patient's symptoms. Specifically,  no evidence of acute aortic syndrome. 2. Mild cardiomegaly. 3. Aortic atherosclerosis, in addition to left main and 3 vessel coronary artery disease. Status post median sternotomy for CABG including LIMA to the LAD. 4. There are calcifications of the aortic valve and mitral annulus. Echocardiographic correlation for evaluation of potential valvular dysfunction may be warranted if clinically indicated. 5. Additional incidental findings, as above. Aortic Atherosclerosis (ICD10-I70.0). Electronically Signed   By: Trudie Reed M.D.   On: 03/17/2019 15:50   Dg Femur Min 2 Views Left  Result Date: 03/31/2019 CLINICAL DATA:  Initial evaluation for acute trauma, fall. EXAM: LEFT FEMUR 2 VIEWS COMPARISON:  None. FINDINGS: No acute fracture or dislocation. Osseous mineralization normal. Mild osteoarthritic changes about the hip and knee. No soft tissue abnormality. Prominent atherosclerotic change noted. IMPRESSION: No acute osseous abnormality about the left femur. Electronically Signed   By: Rise Mu M.D.   On: 03/31/2019 01:55   Dg Hips Bilat W Or Wo Pelvis 5 Views  Result Date: 03/17/2019 CLINICAL DATA:  Bilateral hip pain EXAM: DG HIP (WITH OR WITHOUT PELVIS) 5+V BILAT COMPARISON:  None. FINDINGS: There is no evidence of hip fracture or dislocation. There is no evidence of arthropathy or other focal bone abnormality. Peripheral vascular atherosclerotic disease. IMPRESSION: No acute osseous injury of the bilateral hips. Electronically Signed   By: Elige Ko   On: 03/17/2019 13:34    Vassie Loll, MD  [facility] Pager [phone]  04/08/2019, 11:07 AM   LOS: 4 days

## 2019-04-08 NOTE — Progress Notes (Signed)
Daily Progress Note   Patient Name: [name] [name]       Date: 04/08/2019 DOB: [birth year]  Age: 83 y.o. MRN#: [id number] Attending Physician: Vassie Loll, MD Primary Care Physician: Oval Linsey, MD Admit Date: 04/04/2019  Reason for Consultation/Follow-up: Establishing goals of care  Subjective: [name] [name] wakes to voice. She is oriented to self and tells me she is in [place]. She is pleasantly confused and believes her granddaughter is sitting at bedside. Assisted with water which she tolerated well. She asks for me to remove mitts so she can enjoy coffee. She ate only bites for breakfast. Recently given prn ativan. No s/s of distress or discomfort.   Updated Marden Noble via telephone. He is in contact with hospice and planning to complete paperwork. Understands there are no current beds at hospice facility. Denies questions or concerns.    Length of Stay: 4  Current Medications: Scheduled Meds:  . citalopram  20 mg Oral Daily    Continuous Infusions: . sodium chloride Stopped (04/06/19 1856)    PRN Meds: acetaminophen **OR** acetaminophen, Ipratropium-Albuterol, LORazepam, ondansetron **OR** ondansetron (ZOFRAN) IV, oxyCODONE  Physical Exam Vitals signs and nursing note reviewed.  Constitutional:      General: She is awake.     Appearance: She is ill-appearing.  HENT:     Head: Normocephalic and atraumatic.  Pulmonary:     Effort: No tachypnea, accessory muscle usage or respiratory distress.  Abdominal:     Tenderness: There is no abdominal tenderness.  Skin:    General: Skin is warm and dry.     Coloration: Skin is pale.  Neurological:     Mental Status: She is alert.     Comments: Oriented to self, otherwise disoriented  Psychiatric:      Attention and Perception: She is inattentive.        Mood and Affect: Mood normal.        Speech: Speech normal.        Cognition and Memory: Cognition is impaired.            Vital Signs: BP 106/85   Pulse 80   Temp 99.4 F (37.4 C) (Oral)   Resp 18   Ht  (1.676 m)   Wt 86.7 kg   SpO2 98%   BMI 30.85 kg/m  SpO2:  SpO2: 98 % O2 Device: O2 Device: Nasal Cannula O2 Flow Rate: O2 Flow Rate (L/min): 2 L/min  Intake/output summary:   Intake/Output Summary (Last 24 hours) at 04/08/2019 1003 Last data filed at 04/08/2019 0820 Gross per 24 hour  Intake 340 ml  Output 1900 ml  Net -1560 ml   LBM: Last BM Date: 04/03/19 Baseline Weight: Weight: 91 kg Most recent weight: Weight: 86.7 kg       Palliative Assessment/Data: PPS 30%   Flowsheet Rows     Most Recent Value  Intake Tab  Referral Department  Hospitalist  Unit at Time of Referral  Med/Surg Unit  Palliative Care Primary Diagnosis  Sepsis/Infectious Disease  Palliative Care Type  New Palliative care  Reason for referral  Clarify Goals of Care  Date first seen by Palliative Care  04/06/19  Clinical Assessment  Palliative Performance Scale Score  30%  Psychosocial & Spiritual Assessment  Palliative Care Outcomes  Patient/Family meeting held?  Yes  Who was at the meeting?  multiple family members via conference call  Palliative Care Outcomes  Clarified goals of care, Improved pain interventions, Improved non-pain symptom therapy, Counseled regarding hospice, Provided end of life care assistance, Provided psychosocial or spiritual support, Changed to focus on comfort, ACP counseling assistance, Transitioned to hospice      Patient Active Problem List   Diagnosis Date Noted  . Palliative care by specialist   . Hypoxia   . Pressure injury of skin 04/05/2019  . Acute respiratory failure with hypoxia (HCC) 04/05/2019  . Goals of care, counseling/discussion 04/04/2019  . Acute cystitis without hematuria   . Altered  mental status   . Acute metabolic encephalopathy 03/17/2019  . Chronic diastolic CHF (congestive heart failure) (HCC) 03/17/2019  . Iron deficiency anemia 03/12/2019  . Protein-calorie malnutrition, moderate (HCC) 03/12/2019  . Hypertension associated with type 2 diabetes mellitus (HCC) 03/09/2019  . GERD without esophagitis 03/09/2019  . Dyslipidemia associated with type 2 diabetes mellitus (HCC) 03/09/2019  . Uncontrolled type 2 diabetes mellitus with peripheral neuropathy (HCC) 03/09/2019  . Major depression, recurrent, chronic (HCC) 03/09/2019  . Hypoglycemia 02/22/2019  . Generalized weakness 02/12/2019  . Fall at home 02/12/2019  . Symptomatic Yeast UTI 02/12/2019  . Leukocytosis 02/12/2019  . AKI (acute kidney injury) (HCC) 02/12/2019  . COPD with acute exacerbation (HCC) 12/13/2018  . Syncope   . Loss of consciousness (HCC) 06/10/2018  . Bradycardia, sinus 06/10/2018  . Diastolic dysfunction 09/29/2017  . CAD (coronary artery disease) 09/28/2017  . Acute lower UTI 09/28/2017  . Ischemic stroke (HCC) 05/10/2017  . Acute ischemic stroke (HCC) 05/10/2017  . Dysarthria 03/18/2016  . Anemia of chronic disease 11/19/2015  . OSA (obstructive sleep apnea) 11/19/2015  . Left facial numbness 08/29/2015  . Gait instability 06/26/2015  . COPD exacerbation (HCC) 11/13/2014  . Weakness generalized 12/10/2013  . TIA (transient ischemic attack) 06/22/2012  . Left-sided headache 06/22/2012  . Noncompliance 06/22/2012  . Noncompliance with CPAP treatment 06/22/2012  . Psoriasis 01/07/2012  . Chronic back pain   . Chronic obstructive pulmonary disease (HCC)   . DM type 2 causing complication (HCC) 02/14/2010  . Obesity 02/14/2010  . Arteriosclerotic cardiovascular disease (ASCVD) 02/14/2010  . Hypothyroidism 02/06/2010  . Hyperlipidemia 02/06/2010  . Essential hypertension 02/06/2010  . Sleep apnea 02/06/2010    Palliative Care Assessment & Plan   Patient Profile: 83 y.o.  female  with past medical history of chronic diastolic CHF, stroke, COPD, OSA, DM type 2, hypothyroidism,  HTN, CAD, HLD admitted on 04/04/2019 with altered mental status. Multiple recent hospitalizations (two in March 2020) for acute metabolic encephalopathy, UTI, hypercarbia, hypoglycemia. After previous hospitalization, patient refused discharge to SNF and was discharged home with daughter. Hospital admission for acute metabolic encephalopathy secondary to acute kidney injury and UTI. Overall failure to thrive with functional/cognitive decline over the past 4-5 months per family. Palliative medicine consultation for goals of care.  Assessment: Acute metabolic encephalopathy Acute kidney injury Recurrent UTI's Acute respiratory failure with  Hypoxia Adult failure to thrive Diabetes mellitus type 2 Hx of multiple CVA's Hx of chronic diastolic CHF Hx of COPD/OSA  Recommendations/Plan:  Extensive GOC with multiple family members on 04/06/19. Family decision to focus on comfort measures and pain management. Interventions not aimed at comfort have been discontinued.   Continue prn comfort medications. Patient able to take PO.  Comfort feeds per patient request.   SW following for residential hospice placement. [facility] in contact with family.   Code Status: DNR/DNI   Code Status Orders  (From admission, onward)         Start     Ordered   04/04/19 1430  Do not attempt resuscitation (DNR)  Continuous    Question Answer Comment  In the event of cardiac or respiratory ARREST Do not call a "code blue"   In the event of cardiac or respiratory ARREST Do not perform Intubation, CPR, defibrillation or ACLS   In the event of cardiac or respiratory ARREST Use medication by any route, position, wound care, and other measures to relive pain and suffering. May use oxygen, suction and manual treatment of airway obstruction as needed for comfort.      04/04/19 1429        Code Status  History    Date Active Date Inactive Code Status Order ID Comments User Context   03/17/2019 1648 03/20/2019 1902 Full Code [id number]  Angie Fava, DO ED   02/22/2019 2304 02/26/2019 1737 Full Code [id number]  Briscoe Deutscher, MD ED   02/12/2019 1443 02/22/2019 1954 Full Code [id number]  Cleora Fleet, MD Inpatient   12/13/2018 1032 12/17/2018 1727 Full Code [id number]  Erick Blinks, DO Inpatient   11/01/2018 1640 11/03/2018 1934 Full Code [id number]  Floydene Flock, MD ED   09/13/2018 0631 09/20/2018 1755 Full Code [id number]  Meredeth Ide, MD Inpatient   08/28/2018 0112 09/01/2018 1628 Full Code [id number]  Briscoe Deutscher, MD ED   06/10/2018 1516 06/12/2018 1559 Full Code [id number]  Cleora Fleet, MD Inpatient   09/29/2017 0249 10/01/2017 1838 Full Code [id number]  Bobette Mo, MD Inpatient   09/28/2017 2249 09/29/2017 0249 Full Code [id number]  Bobette Mo, MD Inpatient   05/10/2017 1546 05/13/2017 2125 Full Code [id number]  Versie Starks, MD Inpatient   09/19/2016 2105 09/22/2016 1734 Full Code [id number]  Shon Hale, MD Inpatient   08/01/2016 1956 08/06/2016 1547 Full Code [id number]  Levie Heritage, DO Inpatient   04/22/2016 0305 04/25/2016 1824 Full Code [id number]  Houston Siren, MD Inpatient   03/18/2016 1742 03/22/2016 2138 Full Code [id number]  Henderson Cloud, MD Inpatient   11/19/2015 1622 11/22/2015 1649 Full Code [id number]  Standley Brooking, MD Inpatient   08/29/2015 1957 09/01/2015 2032 Full Code [id number]  Wilson Singer, MD Inpatient   06/26/2015 1942 06/29/2015 1246 Full Code [id number]  Wilson Singer, MD ED   11/13/2014 1604 11/16/2014 1646 Full Code [id number]  Philip Aspen,  Limmie PatriciaEstela Y, MD Inpatient   08/24/2014 2028 08/31/2014 1521 Full Code [id number]  Eddie Northhungel, Nishant, MD Inpatient   12/10/2013 1057 12/14/2013 1347 Full Code [id number]  Gwenyth BenderBlack, Karen M, NP Inpatient   06/22/2012 2044 06/23/2012 1852 Full Code [id number]  Rosanna RandySimpson, Melanie H, RN ED   01/30/2012 0033  02/02/2012 1248 Full Code [id number]  Omar PersonBrewer, Dorothy A, RN Inpatient    Advance Directive Documentation     Most Recent Value  Type of Advance Directive  Healthcare Power of Attorney  Pre-existing out of facility DNR order (yellow form or pink MOST form)  -  "MOST" Form in Place?  -       Prognosis:  Possibly weeks with overall failure to thrive, declining functional/cognitive/nutritional status, UTI, AKI, and multiple underlying co-morbidities  Discharge Planning:   Hospice facility  Care plan was discussed with patient, Dr. Gwenlyn PerkingMadera, RN, great-grandson  Thank you for allowing the Palliative Medicine Team to assist in the care of this patient.   Time In: 0950- 1220 Time Out: 1000 1230 Total Time 20 Prolonged Time Billed no      Greater than 50%  of this time was spent counseling and coordinating care related to the above assessment and plan.  Vennie HomansMegan Kellene Mccleary, FNP-C Palliative Medicine Team  Phone: [phone] Fax: [phone]  Please contact Palliative Medicine Team phone at [phone] for questions and concerns.

## 2019-04-09 LAB — CULTURE, BLOOD (ROUTINE X 2)
Culture: NO GROWTH
Culture: NO GROWTH

## 2019-04-09 MED ORDER — DICLOFENAC SODIUM 1 % TD GEL
2.0000 g | Freq: Four times a day (QID) | TRANSDERMAL | Status: DC
  Administered 2019-04-09 – 2019-04-10 (×3): 2 g via TOPICAL
  Filled 2019-04-09: qty 100

## 2019-04-09 NOTE — Progress Notes (Signed)
PROGRESS NOTE  [name] [name] ZOX:[id number] DOB: 12-Nov-1936 DOA: 04/04/2019 PCP: Oval Linsey, MD   Brief History:  83 y.o.femalewith medical history ofdiabetes mellitus type 2, chronic diastolic CHF, stroke, COPD, hypothyroidism, hypertension, coronary disease, hyperlipidemia, OSA presenting with altered mental status. The patient has had numerous hospitalizations with similar presentation. Most recently, the patient was discharged from the hospital after a stay from 03/17/2019 through 03/20/2019 for acute metabolic encephalopathy that was multifactorial including UTI and hypercarbia. In addition, the patient had another admission from 02/22/2019 through 02/26/2019 for encephalopathy secondary to hypoglycemia and UTI. Since her discharge from the hospital on 03/20/2019, the patient was discharged home to live with her daughter. The patient had refused to go to a skilled nursing facility at that time. Initially, the patient was able to stand and take a few steps with a walker. However over the past week, the patient's family has noted increasing generalized weakness, decreased oral intake, and more somnolence. On 03/29/2019, the patient was complaining of some dysuria. Her home health nurse called the patient's PCP. The patient was prescribed ciprofloxacin. Since that date, the patient has had increasing somnolence which was significantly worse on 04/03/2019. During this past week, the patient has had significantly decreased oral intake. According to the patient's family, the patient was somnolent and slept most of the day. When she woke up on 04/04/2019, the patient complained of chest discomfort. As result, EMS was activated. During this past week, the patient has not had any recurrent discomfort or complained of any chronic worsening cough or shortness of breath. The patient did have a low-grade temperature of 100.2 F on 04/03/2019. There is been no complaints of headaches,  nausea, vomiting, diarrhea, abdominal pain. According to the patient's daughter, they have been compliant with social distancing in the context of the coronavirus pandemic. There is been no sick contacts. Other than the ciprofloxacin, there has not been any new medications. In the emergency department, the patient had a low-grade temperature of 99.7 F. She was hemodynamically stable. Oxygen saturations were 92--95% on room air. BMP showed a serum creatinine of 3.48 which was significantly above her usual baseline. WBC was 10.2 with hemoglobin 11.3 and platelets 270,000. Chest x-ray showed poor inspiration withoutconsolidation or edema. Renal ultrasound was negative for hydronephrosis but showed right renal atrophy. CT of the brain was negative for acute findings. Lactic acid was 1.8. Urinalysis showed 21-50 WBC. The patient was given a liter bolus of normal saline and started on ceftriaxone.  Assessment/Plan: Acute metabolic encephalopathy -Secondary to acute kidney injury and UTI, ciprofloxacin as well as other hypnotic medications including oxycodone/gabapentin which have been discontinued -At baseline, the patient has poor functional status requiring assistance with all aspects of activities of daily living -She has been essentially bedbound for the past week -After further discussion with family and following recommendations by palliative care patient has been transition to full comfort care. -All medications and treatment other than to achieve comfort has been discontinued -Continue the use of Celexa daily, started on oxycodone and Ativan as needed.  Acute kidney injury -Secondary to volume depletion and hemodynamic changes (low BPs) -baseline creatinine 0.7-0.8 -serum creatinine peaked 3.64 -Continue avoiding nephrotoxic agents. -Renal ultrasound negative for hydronephrosis -IV fluid has been discontinued. -Plan of care is for comfort.  Pyuria -Concerning for UTI on  admission. -She received treatment with Rocephin for 3 days; no significant improvement or changes in condition. -After discussing with family decision has  been made to pursuit only comfort care -Antibiotic has been discontinued.  Hypoxia/Acute respiratory failure with hypoxia -due to OSA/OHS and hypoventilation -ABG--7.305/47/82/22 on 2L -oxygen saturation 92-94% on RA; desaturates during sleep -checked D-dimer--0.95 -V/Q scan neg -Continue 2 L oxygen supplementation as part of comfort care.  Essential hypertension -We will discontinue antihypertensive medication in the setting of full comfort care.  Diabetes mellitus type 2, uncontrolled with hyperglycemia/hypoglycemia -Given the patient decreased oral intake and following decision for full comfort only will discontinue insulin therapy.   -Last A1c 8.5.   -With patient has demonstrated intermittent episode of hypoglycemia in the setting of poor oral intake.  Failure to thrive in adult -The patient's daughter relates that functional and cognitive decline over the past 4 to 5 months has been steadily and progressive. -Goals of care discussed--patient is DNR/DNI -PT evaluation--recommended 24 hour care -Serum B12--724 -Folic acid--20.6 -ammonia < 9 -03/18/2019 TSH 0.558 -patient continues to have poor po intake resulting in hypoglycemia intermittently.  Hyperlipidemia -Statin has been discontinued -Plan of care is for comfort  Hypothyroidism -levothyroxine discontinued -Plan of care is full comfort.  History of multiple strokes -Prior to admission patient was using aspirin and Plavix for secondary prevention -Strokes has been associated with severe right PCA stenosis. -Following discussion with family and current patient's condition; aspirin and Plavix has been discontinued and focus on comfort care only.   Goals of Care. -DNR/DNI -Appreciate assistance and recommendation by palliative care medicine -Given overall  decline, failure to thrive and poor functional state decision has been made to pursuit for comfort care.   -Hospice has been involved and will follow recommendation -For now continue to focus on full comfort care and comfort feeding -not invasive or heroic measures   Stage 2 sacral decubitus -present prior to admission -Continue preventive measures.  Left wrist pain -Swelling appreciated -We will continue pain medication and using topical Voltaren gel   Disposition Plan:   SNF vs residential hospice; waiting on final decision from Hospice. SW assisting with placement and discharges.  Family Communication:  No Family at bedside  Consultants:  Palliative medicine  Code Status:  DNR  DVT Prophylaxis:  [place] Heparin   Procedures: As Listed in Progress Note Above  Antibiotics: None   Subjective: Afebrile, intermittently using nasal cannula supplementation; reports no chest pain, no nausea, no vomiting.  Overall with poor oral intake.  Reports no appetite.  Complaining of back pain and left wrist pain.  Objective: Vitals:   04/08/19 1922 04/08/19 2112 04/09/19 0546 04/09/19 0800  BP:  (!) 167/86 (!) 181/80 (!) 158/64  Pulse:  76 65 74  Resp:  18 18   Temp:  98.5 F (36.9 C) 98.5 F (36.9 C)   TempSrc:  Oral Oral   SpO2: 91% 100% 96%   Weight:      Height:        Intake/Output Summary (Last 24 hours) at 04/09/2019 1517 Last data filed at 04/09/2019 0400 Gross per 24 hour  Intake 120 ml  Output 1200 ml  Net -1080 ml   Weight change:   Exam: General exam: Alert, awake, oriented x 2; in no major distress.  Patient is afebrile, denies chest pain, no nausea and no vomiting.  Still with poor overall appetite and mainly drinking water/juices.  Complaining of back pain and also left wrist discomfort. Respiratory system: Fair air movement bilaterally, positive scattered rhonchi.  Continue to use nasal cannula oxygen supplementation intermittently.   Cardiovascular  system:RRR. No murmurs, rubs,  gallops. Gastrointestinal system: Abdomen is nondistended, soft and nontender. No organomegaly or masses felt. Normal bowel sounds heard. Central nervous system: Alert and oriented. No focal neurological deficits. Extremities: No cyanosis, no clubbing; trace edema bilaterally appreciated in her lower extremities.  There is left wrist swelling and tenderness to palpation, patient able to hold things with her hands and moving it without too much difficulties. Skin: No rashes, no petechiae, stage II decubitus pressure injury present prior to admission; there is no signs of superimposed infection on exam. Psychiatry: Mood and affect appears to be appropriate.  No suicidal ideation or hallucinations.  Data Reviewed: I have personally reviewed following labs and imaging studies  Basic Metabolic Panel: Recent Labs  Lab 04/04/19 0942 04/05/19 0458 04/06/19 0528  NA 139 141 143  K 4.8 4.5 4.7  CL 103 109 111  CO2 25 22 21*  GLUCOSE 146* 118* 126*  BUN 26* 26* 26*  CREATININE 3.48* 3.64* 3.56*  CALCIUM 9.1 8.2* 8.7*  MG  --  1.9  --    Liver Function Tests: Recent Labs  Lab 04/04/19 0942  AST 19  ALT 13  ALKPHOS 53  BILITOT 1.0  PROT 6.3*  ALBUMIN 3.5    Recent Labs  Lab 04/05/19 1214  AMMONIA <9*   Coagulation Profile: Recent Labs  Lab 04/04/19 0942  INR 1.2   CBC: Recent Labs  Lab 04/04/19 0942 04/05/19 0458 04/06/19 0528  WBC 10.2 7.7 7.9  NEUTROABS 7.3  --   --   HGB 11.3* 9.5* 11.2*  HCT 38.1 33.3* 39.3  MCV 84.9 86.5 86.6  PLT 270 244 248   Cardiac Enzymes: Recent Labs  Lab 04/04/19 0944 04/04/19 1529 04/04/19 2228 04/06/19 0528  CKTOTAL  --  98  --  88  TROPONINI <0.03 <0.03 <0.03  --    BNP: Invalid input(s): POCBNP CBG: Recent Labs  Lab 04/07/19 2108 04/08/19 0745 04/08/19 1114 04/08/19 1629 04/08/19 2113  GLUCAP 141* 129* 142* 114* 148*   Urine analysis:    Component Value Date/Time   COLORURINE  YELLOW 04/04/2019 0932   APPEARANCEUR HAZY (A) 04/04/2019 0932   LABSPEC 1.006 04/04/2019 0932   PHURINE 5.0 04/04/2019 0932   GLUCOSEU NEGATIVE 04/04/2019 0932   HGBUR NEGATIVE 04/04/2019 0932   BILIRUBINUR NEGATIVE 04/04/2019 0932   KETONESUR NEGATIVE 04/04/2019 0932   PROTEINUR NEGATIVE 04/04/2019 0932   UROBILINOGEN 0.2 08/29/2015 1842   NITRITE NEGATIVE 04/04/2019 0932   LEUKOCYTESUR MODERATE (A) 04/04/2019 0932    Recent Results (from the past 240 hour(s))  Culture, blood (Routine x 2)     Status: None   Collection Time: 04/04/19  9:32 AM  Result Value Ref Range Status   Specimen Description   Final    BLOOD LEFT HAND Blood Culture results may not be optimal due to an inadequate volume of blood received in culture bottles BOTTLES DRAWN AEROBIC AND ANAEROBIC   Special Requests NONE  Final   Culture   Final    NO GROWTH 5 DAYS Performed at [facility], [address] [place], [place] [address]    Report Status 04/09/2019 FINAL  Final  Urine Culture     Status: None   Collection Time: 04/04/19  9:32 AM  Result Value Ref Range Status   Specimen Description   Final    URINE, RANDOM Performed at [facility], [address] [place], [place] [address]    Special Requests   Final    NONE Performed at [facility], [address], [place], [place] [address]    Culture   Final    NO GROWTH Performed at [facility], [address], [place], [place] [address]    Report Status 04/05/2019 FINAL  Final  Culture, blood (Routine x 2)     Status: None   Collection Time: 04/04/19  9:37 AM  Result Value Ref Range Status   Specimen Description   Final    BLOOD RIGHT ANTECUBITAL Blood Culture results may not be optimal due to an excessive volume of blood received in culture bottles BOTTLES DRAWN AEROBIC AND ANAEROBIC   Special Requests NONE  Final   Culture   Final    NO GROWTH 5 DAYS Performed at [facility], [address], [place], [place] [address]    Report Status  04/09/2019 FINAL  Final  SARS Coronavirus 2 [facility] order, Performed in [facility] hospital lab)     Status: None   Collection Time: 04/06/19  1:13 PM  Result Value Ref Range Status   SARS Coronavirus 2 NEGATIVE NEGATIVE Final    Comment: (NOTE) If result is NEGATIVE SARS-CoV-2 target nucleic acids are NOT DETECTED. The SARS-CoV-2 RNA is generally detectable in upper and lower  respiratory specimens during the acute phase of infection. The lowest  concentration of SARS-CoV-2 viral copies this assay can detect is 250  copies / mL. A negative result does not preclude SARS-CoV-2 infection  and should not be used as the sole basis for treatment or other  patient management decisions.  A negative result may occur with  improper specimen collection / handling, submission of specimen other  than nasopharyngeal swab, presence of viral mutation(s) within the  areas targeted by this assay, and inadequate number of viral copies  (<250 copies / mL). A negative result must be combined with clinical  observations, patient history, and epidemiological information. If result is POSITIVE SARS-CoV-2 target nucleic acids are DETECTED. The SARS-CoV-2 RNA is generally detectable in upper and lower  respiratory specimens dur ing the acute phase of infection.  Positive  results are indicative of active infection with SARS-CoV-2.  Clinical  correlation with patient history and other diagnostic information is  necessary to determine patient infection status.  Positive results do  not rule out bacterial infection or co-infection with other viruses. If result is PRESUMPTIVE POSTIVE SARS-CoV-2 nucleic acids MAY BE PRESENT.   A presumptive positive result was obtained on the submitted specimen  and confirmed on repeat testing.  While 2019 novel coronavirus  (SARS-CoV-2) nucleic acids may be present in the submitted sample  additional confirmatory testing may be necessary for epidemiological  and / or  clinical management purposes  to differentiate between  SARS-CoV-2 and other Sarbecovirus currently known to infect humans.  If clinically indicated additional testing with an alternate test  methodology [phone]) is advised. The SARS-CoV-2 RNA is generally  detectable in upper and lower respiratory sp ecimens during the acute  phase of infection. The expected result is Negative. Fact Sheet for Patients:  BoilerBrush.com.cy Fact Sheet for Healthcare Providers: https://pope.com/ This test is not yet approved or cleared by the [place] FDA and has been authorized for detection and/or diagnosis of SARS-CoV-2 by FDA under an Emergency Use Authorization (EUA).  This EUA will remain in effect (meaning this test can be used) for the duration of the COVID-19 declaration under Section 564(b)(1) of the Act, 21 U.S.C. section 360bbb-3(b)(1), unless the authorization is terminated or revoked sooner. Performed at [facility], [address], [place], [place] [address]      Scheduled Meds:  citalopram  20 mg Oral Daily   Continuous Infusions:  sodium chloride Stopped (04/06/19 1856)    Procedures/Studies: Dg Chest 1 View  Result Date: 04/05/2019 CLINICAL DATA:  83 year old female with a history of hypoxia EXAM: CHEST  1 VIEW COMPARISON:  04/04/2019, 03/17/2019, CT chest 03/17/2019 FINDINGS: Cardiomediastinal silhouette unchanged in size and contour with surgical changes of median sternotomy and CABG. Low lung volumes. Interlobular septal thickening with coarsened interstitial markings. Similar appearance of linear opacity on the right and linear opacities at the left lung base. Blunting of the costophrenic angle bilaterally with meniscus on the right. IMPRESSION: Similar appearance of low lung volumes with atelectasis and evidence of edema. Small right pleural effusion and associated atelectasis/consolidation. Surgical changes of median  sternotomy and CABG. Electronically Signed   By: Gilmer Mor D.O.   On: 04/05/2019 17:17   Dg Chest 1 View  Result Date: 03/17/2019 CLINICAL DATA:  Altered mental status. EXAM: CHEST  1 VIEW COMPARISON:  02/12/2019 FINDINGS: Postsurgical changes from CABG. The cardiac silhouette is enlarged. There is an apparent widening of the mediastinum, when compared to patient's prior radiographs. There is no evidence of focal airspace consolidation, or pneumothorax. Possible right pleural effusion. Osseous structures are without acute abnormality. Soft tissues are grossly normal. IMPRESSION: 1. Apparent widening of the mediastinum, when compared to patient's prior radiographs. This may be due to poor inspiratory effort, however vascular anomaly can not be excluded. Please correlate to patient's symptomatology. If the patient displays signs of acute aortic syndrome, then CT angiogram of the chest should be performed. Otherwise PA and lateral radiograph the chest may be considered. 2. Possible right pleural effusion. These results were called by telephone at the time of interpretation on 03/17/2019 at 1:43 pm to Dr. Samuel Jester , who verbally acknowledged these results. Electronically Signed   By: Ted Mcalpine M.D.   On: 03/17/2019 13:45   Dg Chest 2 View  Result Date: 04/04/2019 CLINICAL DATA:  83 year old with acute mental status changes that began yesterday. Patient is currently undergoing treatment for a urinary tract infection. Acute chest pain that began earlier today. EXAM: CHEST - 2 VIEW COMPARISON:  CT chest and chest x-ray 03/17/2019 and earlier. FINDINGS: AP ERECT and LATERAL images were obtained. Markedly suboptimal inspiration. Prior sternotomy for CABG. Cardiac silhouette mildly enlarged, unchanged. Pulmonary vascularity normal without evidence of pulmonary edema. Linear scarring scattered throughout both lungs, unchanged. Lungs otherwise clear. No visible pleural effusions. Prior ORIF of a  LEFT humerus fracture. IMPRESSION: Markedly suboptimal inspiration. Stable mild cardiomegaly. No acute cardiopulmonary disease. Electronically Signed   By: Hulan Saas M.D.   On: 04/04/2019 11:11   Dg Pelvis 1-2 Views  Result Date: 03/31/2019 CLINICAL DATA:  Fall 2 days ago with left pelvis/hip and femur pain. Recent fall leading to iliopsoas tendon tear. EXAM: PELVIS - 1-2 VIEW COMPARISON:  Pelvis and hip radiographs 03/17/2019. Pelvic MRI 03/19/2019 FINDINGS: The cortical margins of the bony pelvis are intact. No fracture. Pubic symphysis and sacroiliac joints are congruent. Both femoral heads are well-seated in the respective acetabula. There are vascular calcifications. IMPRESSION: No evidence of pelvic fracture. Electronically Signed   By: Narda Rutherford M.D.   On: 03/31/2019 02:06   Ct Head Wo Contrast  Result Date: 04/04/2019 CLINICAL DATA:  Altered level of consciousness. EXAM: CT HEAD WITHOUT CONTRAST TECHNIQUE: Contiguous axial images were obtained from the base of the skull  through the vertex without intravenous contrast. COMPARISON:  CT head 03/31/2019 FINDINGS: Brain: Atrophy and chronic microvascular ischemic changes are stable. No new findings. No acute infarct, hemorrhage, or mass. Vascular: Negative for hyperdense vessel Skull: Negative Sinuses/Orbits: Small air-fluid level posterior ethmoid sinus on the right. Remaining sinuses clear. Bilateral cataract surgery. Other: None IMPRESSION: No acute abnormality and no change from the recent study. Electronically Signed   By: Marlan Palauharles  Clark M.D.   On: 04/04/2019 10:57   Ct Head Wo Contrast  Result Date: 03/31/2019 CLINICAL DATA:  Initial evaluation for acute trauma, recent fall. EXAM: CT HEAD WITHOUT CONTRAST TECHNIQUE: Contiguous axial images were obtained from the base of the skull through the vertex without intravenous contrast. COMPARISON:  Prior CT from 03/17/2019. FINDINGS: Brain: Generalized age-related cerebral atrophy with  moderate chronic microvascular ischemic disease. No acute intracranial hemorrhage. No acute large vessel territory infarct. No mass lesion, midline shift or mass effect. No hydrocephalus. No extra-axial fluid collection. Vascular: No hyperdense vessel. Scattered vascular calcifications noted within the carotid siphons. Skull: Scalp soft tissues demonstrate no acute finding. Calvarium intact. Sinuses/Orbits: Globes and orbital soft tissues within normal limits. Mild layering opacity within the right sphenoid sinus. Paranasal sinuses are otherwise clear. No mastoid effusion. Other: None. IMPRESSION: 1. No acute intracranial abnormality. 2. Generalized age-related cerebral atrophy with moderate chronic small vessel ischemic disease. 3. Mild right sphenoid sinusitis. Electronically Signed   By: Rise MuBenjamin  McClintock M.D.   On: 03/31/2019 02:00   Ct Head Wo Contrast  Result Date: 03/17/2019 CLINICAL DATA:  Explain confusion. EXAM: CT HEAD WITHOUT CONTRAST CT CERVICAL SPINE WITHOUT CONTRAST TECHNIQUE: Multidetector CT imaging of the head and cervical spine was performed following the standard protocol without intravenous contrast. Multiplanar CT image reconstructions of the cervical spine were also generated. COMPARISON:  Brain MRI 02/17/2019, cervical spine MRI 02/18/2019 FINDINGS: CT HEAD FINDINGS Brain: No evidence of acute infarction, hemorrhage, hydrocephalus, extra-axial collection or mass lesion/mass effect. Moderate brain parenchymal volume loss and deep white matter microangiopathy. Vascular: Calcific atherosclerotic disease of the intra cavernous carotid arteries. Skull: Normal. Negative for fracture or focal lesion. Sinuses/Orbits: No acute finding. Other: None CT CERVICAL SPINE FINDINGS Alignment: Normal. Skull base and vertebrae: No acute fracture. No primary bone lesion or focal pathologic process. Soft tissues and spinal canal: No prevertebral fluid or swelling. No visible canal hematoma. Disc levels:   Multilevel osteoarthritic changes. Upper chest: Negative. Other: IMPRESSION: 1. No acute intracranial abnormality. 2. Atrophy, chronic microvascular disease. 3. No evidence of acute traumatic injury to cervical spine. 4. Multilevel osteoarthritic changes of the cervical spine. Electronically Signed   By: Ted Mcalpineobrinka  Dimitrova M.D.   On: 03/17/2019 13:28   Ct Cervical Spine Wo Contrast  Result Date: 03/17/2019 CLINICAL DATA:  Explain confusion. EXAM: CT HEAD WITHOUT CONTRAST CT CERVICAL SPINE WITHOUT CONTRAST TECHNIQUE: Multidetector CT imaging of the head and cervical spine was performed following the standard protocol without intravenous contrast. Multiplanar CT image reconstructions of the cervical spine were also generated. COMPARISON:  Brain MRI 02/17/2019, cervical spine MRI 02/18/2019 FINDINGS: CT HEAD FINDINGS Brain: No evidence of acute infarction, hemorrhage, hydrocephalus, extra-axial collection or mass lesion/mass effect. Moderate brain parenchymal volume loss and deep white matter microangiopathy. Vascular: Calcific atherosclerotic disease of the intra cavernous carotid arteries. Skull: Normal. Negative for fracture or focal lesion. Sinuses/Orbits: No acute finding. Other: None CT CERVICAL SPINE FINDINGS Alignment: Normal. Skull base and vertebrae: No acute fracture. No primary bone lesion or focal pathologic process. Soft tissues and spinal canal:  No prevertebral fluid or swelling. No visible canal hematoma. Disc levels:  Multilevel osteoarthritic changes. Upper chest: Negative. Other: IMPRESSION: 1. No acute intracranial abnormality. 2. Atrophy, chronic microvascular disease. 3. No evidence of acute traumatic injury to cervical spine. 4. Multilevel osteoarthritic changes of the cervical spine. Electronically Signed   By: Ted Mcalpine M.D.   On: 03/17/2019 13:28   US Renal  Result Date: 04/04/2019 CLINICAL DATA:  Acute kidney injury EXAM: RENAL / URINARY TRACT ULTRASOUND COMPLETE  COMPARISON:  05/19/2017 CT abdomen/pelvis. FINDINGS: Right Kidney: Renal measurements: 10.0 x 4.9 x 5.0 cm = volume: 129 mL. No hydronephrosis. Mildly echogenic and mildly atrophic right renal parenchyma. No right renal mass. Left Kidney: Renal measurements: 12.7 x 6.1 x 6.2 cm = volume: 251 mL. No hydronephrosis. Mildly echogenic renal parenchyma, normal in thickness. Simple appearing 2.2 x 1.9 x 1.9 cm interpolar left renal cyst. Bladder: Appears normal for degree of bladder distention. Ureteral jets are not visualized in the bladder on color Doppler. IMPRESSION: 1. No hydronephrosis. 2. Asymmetric right renal atrophy. 3. Echogenic kidneys, indicative of nonspecific renal parenchymal disease of uncertain chronicity. 4. Simple appearing left renal cyst. 5. Normal bladder. Electronically Signed   By: Delbert Phenix M.D.   On: 04/04/2019 12:20   Mr Hip Left Wo Contrast  Result Date: 03/19/2019 CLINICAL DATA:  Left hip pain and difficulty standing. Injury history is uncertain. EXAM: MR OF THE LEFT HIP WITHOUT CONTRAST TECHNIQUE: Multiplanar, multisequence MR imaging was performed. No intravenous contrast was administered. COMPARISON:  Plain films left hip 03/17/2019. FINDINGS: Bones: No fracture, dislocation or focal lesion. No worrisome lesion. Lower lumbar spondylosis with associated degenerative endplate signal change is noted. Articular cartilage and labrum Articular cartilage:  Appears preserved. Labrum:  The anterior, superior labrum is degenerated. Joint or bursal effusion Joint effusion:  None. Bursae: Negative. Muscles and tendons Muscles and tendons: The patient has a complete tear of the left iliopsoas tendon from the lesser trochanter. The tendon is retracted 3-4 cm. Edema is seen about the distal tendon and tracks into the muscle belly most consistent with acute or early subacute tear. There is also some edema in the left adductor magnus compatible with strain without tear. Tendinosis of the hamstrings  at their origins is worse on the left. Partial tear of the left hamstrings off the ischial tuberosity measures 1.4 cm AP with little to no retraction. Other findings Miscellaneous: Imaged intrapelvic contents demonstrate no acute abnormality. IMPRESSION: Negative for fracture or dislocation. Complete tear of the left ileus psoas tendon from the lesser tuberosity with 3-4 cm of retraction appears acute or early subacute. Chronic appearing tendinosis and partial tear of the left hamstrings from their origin. Edema in the left adductor magnus compatible with strain without tear. Electronically Signed   By: Drusilla Kanner M.D.   On: 03/19/2019 13:22   Nm Pulmonary Perf And Vent  Result Date: 04/05/2019 CLINICAL DATA:  Hypoxia, chest pain EXAM: NUCLEAR MEDICINE PERFUSION LUNG SCAN TECHNIQUE: Perfusion images were obtained in multiple projections after intravenous injection of Tc-74m MAA. RADIOPHARMACEUTICALS:  4.5 mCi Tc75m MAA IV COMPARISON:  None. FINDINGS: Perfusion: No wedge shaped peripheral perfusion defects to suggest acute pulmonary embolism. IMPRESSION: No scintigraphic evidence of pulmonary embolus. Electronically Signed   By: Elige Ko   On: 04/05/2019 17:23   Dg Humerus Left  Result Date: 03/31/2019 CLINICAL DATA:  Initial evaluation for acute trauma, fall. EXAM: LEFT HUMERUS - 2+ VIEW COMPARISON:  None. FINDINGS: There is no evidence of  fracture or other focal bone lesions. Mild degenerative changes noted about the shoulder and elbow. Soft tissues are unremarkable. IMPRESSION: No acute osseous abnormality about the left humerus. Electronically Signed   By: Rise Mu M.D.   On: 03/31/2019 02:06   Ct Angio Chest/abd/pel For Dissection W And/or W/wo  Result Date: 03/17/2019 CLINICAL DATA:  83 year old female complaining of back pain. Recent history of fall. EXAM: CT ANGIOGRAPHY CHEST, ABDOMEN AND PELVIS TECHNIQUE: Multidetector CT imaging through the chest, abdomen and pelvis was  performed using the standard protocol during bolus administration of intravenous contrast. Multiplanar reconstructed images and MIPs were obtained and reviewed to evaluate the vascular anatomy. CONTRAST:  OMNIPAQUE IOHEXOL 350 MG/ML SOLN COMPARISON:  No prior chest CT.  CT abdomen and pelvis 05/19/2017. FINDINGS: CTA CHEST FINDINGS Cardiovascular: Heart size is mildly enlarged. There is no significant pericardial fluid, thickening or pericardial calcification. There is aortic atherosclerosis, as well as atherosclerosis of the great vessels of the mediastinum and the coronary arteries, including calcified atherosclerotic plaque in the left main, left anterior descending, left circumflex and right coronary arteries. Status post median sternotomy for CABG including LIMA to the LAD. On precontrast images there is no crescentic high attenuation associated with the wall of the thoracic aorta to suggest acute intramural hemorrhage. No aortic aneurysm or dissection. Calcifications of the aortic valve and mitral annulus. Mediastinum/Nodes: No pathologically enlarged mediastinal or hilar lymph nodes. Esophagus is normal in appearance. No axillary lymphadenopathy. Lungs/Pleura: Scattered areas of linear scarring are noted in the lungs bilaterally, most evident near the lung apices and in the lung bases. Areas of mild dependent subsegmental atelectasis also noted in the lower lobes of the lungs bilaterally. No acute consolidative airspace disease. No pleural effusions. No definite suspicious appearing pulmonary nodules or masses are noted. Musculoskeletal: Median sternotomy wires. There are no aggressive appearing lytic or blastic lesions noted in the visualized portions of the skeleton. Review of the MIP images confirms the above findings. CTA ABDOMEN AND PELVIS FINDINGS VASCULAR Aorta: Normal caliber aorta without aneurysm, dissection, vasculitis or significant stenosis. Celiac: Patent without evidence of aneurysm,  dissection, vasculitis or significant stenosis. SMA: Patent without evidence of aneurysm, dissection, vasculitis or significant stenosis. Renals: Both renal arteries are patent without evidence of aneurysm, dissection, vasculitis, fibromuscular dysplasia or significant stenosis. IMA: Patent without evidence of aneurysm, dissection, vasculitis or significant stenosis. Inflow: Patent without evidence of aneurysm, dissection, vasculitis or significant stenosis. Veins: No obvious venous abnormality within the limitations of this arterial phase study. Review of the MIP images confirms the above findings. NON-VASCULAR Hepatobiliary: No suspicious cystic or solid hepatic lesions. Status post cholecystectomy. No intrahepatic biliary ductal dilatation. Common bile duct measures 7 mm in the porta hepatis, within normal limits for this post cholecystectomy patient. Pancreas: No pancreatic mass. No pancreatic ductal dilatation. No pancreatic or peripancreatic fluid or inflammatory changes. Spleen: Unremarkable. Adrenals/Urinary Tract: Low-attenuation lesions in both kidneys, compatible with simple cysts, largest of which measures 2.1 cm in the upper pole the left kidney. Multifocal cortical thinning in the right kidney, presumably chronic scarring. No definite suspicious renal lesions. No hydroureteronephrosis. Urinary bladder is normal in appearance. Bilateral adrenal glands are normal in appearance. Stomach/Bowel: Normal appearance of the stomach. No pathologic dilatation of small bowel or colon. The appendix is not confidently identified and may be surgically absent. Regardless, there are no inflammatory changes noted adjacent to the cecum to suggest the presence of an acute appendicitis at this time. Lymphatic: No lymphadenopathy noted in the  abdomen or pelvis. Reproductive: Status post hysterectomy. Ovaries are not confidently identified may be surgically absent or trophic. Other: No significant volume of ascites.  No  pneumoperitoneum. Musculoskeletal: Median sternotomy wires. No acute displaced fractures or aggressive appearing lytic or blastic lesions are noted in the visualized portions of the skeleton. Review of the MIP images confirms the above findings. IMPRESSION: 1. No acute findings are noted in the chest, abdomen or pelvis to account for the patient's symptoms. Specifically, no evidence of acute aortic syndrome. 2. Mild cardiomegaly. 3. Aortic atherosclerosis, in addition to left main and 3 vessel coronary artery disease. Status post median sternotomy for CABG including LIMA to the LAD. 4. There are calcifications of the aortic valve and mitral annulus. Echocardiographic correlation for evaluation of potential valvular dysfunction may be warranted if clinically indicated. 5. Additional incidental findings, as above. Aortic Atherosclerosis (ICD10-I70.0). Electronically Signed   By: Trudie Reed M.D.   On: 03/17/2019 15:50   Dg Femur Min 2 Views Left  Result Date: 03/31/2019 CLINICAL DATA:  Initial evaluation for acute trauma, fall. EXAM: LEFT FEMUR 2 VIEWS COMPARISON:  None. FINDINGS: No acute fracture or dislocation. Osseous mineralization normal. Mild osteoarthritic changes about the hip and knee. No soft tissue abnormality. Prominent atherosclerotic change noted. IMPRESSION: No acute osseous abnormality about the left femur. Electronically Signed   By: Rise Mu M.D.   On: 03/31/2019 01:55   Dg Hips Bilat W Or Wo Pelvis 5 Views  Result Date: 03/17/2019 CLINICAL DATA:  Bilateral hip pain EXAM: DG HIP (WITH OR WITHOUT PELVIS) 5+V BILAT COMPARISON:  None. FINDINGS: There is no evidence of hip fracture or dislocation. There is no evidence of arthropathy or other focal bone abnormality. Peripheral vascular atherosclerotic disease. IMPRESSION: No acute osseous injury of the bilateral hips. Electronically Signed   By: Elige Ko   On: 03/17/2019 13:34   Time 25 minutes   Vassie Loll, MD  [facility] Pager [phone]  04/09/2019, 3:17 PM   LOS: 5 days

## 2019-04-09 NOTE — Care Management Important Message (Signed)
Important Message  Patient Details  Name: [name] [name] MRN: [id number] Date of Birth: [birth year]   Medicare Important Message Given:  Yes(given to nurse to place at bedside due to patient being on contact precaution)    Corey Harold 04/09/2019, 4:03 PM

## 2019-04-10 DIAGNOSIS — M25532 Pain in left wrist: Secondary | ICD-10-CM

## 2019-04-10 DIAGNOSIS — K219 Gastro-esophageal reflux disease without esophagitis: Secondary | ICD-10-CM

## 2019-04-10 MED ORDER — LORAZEPAM 0.5 MG PO TABS: 0.5000 mg | ORAL_TABLET | Freq: Four times a day (QID) | ORAL | 0 refills | Status: AC | PRN

## 2019-04-10 MED ORDER — DICLOFENAC SODIUM 1 % TD GEL: 2.0000 g | Freq: Four times a day (QID) | TRANSDERMAL | Status: AC | PRN

## 2019-04-10 MED ORDER — OXYCODONE HCL 5 MG PO TABS: 5.0000 mg | ORAL_TABLET | Freq: Four times a day (QID) | ORAL | 0 refills | Status: AC | PRN

## 2019-04-10 NOTE — Progress Notes (Signed)
Report called and given to staff at the [facility]. IV removed, WNL. Family and patient notified of discharge.

## 2019-04-10 NOTE — Discharge Summary (Signed)
Physician Discharge Summary  Cortasia Screws Tabbert OZD:[id number] DOB: September 16, 1936 DOA: 04/04/2019  PCP: Oval Linsey, MD  Admit date: 04/04/2019 Discharge date: 04/10/2019  Time spent: 35 minutes  Recommendations for Outpatient Follow-up:  1. Full comfort care   Discharge Diagnoses:  Active Problems:   Hyperlipidemia   OSA (obstructive sleep apnea)   Acute lower UTI   AKI (acute kidney injury) (HCC)   Gastroesophageal reflux disease   Uncontrolled type 2 diabetes mellitus with peripheral neuropathy (HCC)   Acute metabolic encephalopathy   Chronic diastolic CHF (congestive heart failure) (HCC)   Goals of care, counseling/discussion   Pressure injury of skin   Acute respiratory failure with hypoxia [facility])   Palliative care by specialist   Hypoxia   Left wrist pain   Discharge Condition: discharge to residential hospice for full comfort care.  Diet recommendation: comfort feeding  Filed Weights   04/04/19 1506 04/06/19 0506 04/08/19 0413  Weight: 87.5 kg 89.7 kg 86.7 kg    History of present illness:  83 y.o.femalewith medical history ofdiabetes mellitus type 2, chronic diastolic CHF, stroke, COPD, hypothyroidism, hypertension, coronary disease, hyperlipidemia, OSA presenting with altered mental status. The patient has had numerous hospitalizations with similar presentation. Most recently, the patient was discharged from the hospital after a stay from 03/17/2019 through 03/20/2019 for acute metabolic encephalopathy that was multifactorial including UTI and hypercarbia. In addition, the patient had another admission from 02/22/2019 through 02/26/2019 for encephalopathy secondary to hypoglycemia and UTI. Since her discharge from the hospital on 03/20/2019, the patient was discharged home to live with her daughter. The patient had refused to go to a skilled nursing facility at that time. Initially, the patient was able to stand and take a few steps with a walker. However over the  past week, the patient's family has noted increasing generalized weakness, decreased oral intake, and more somnolence. On 03/29/2019, the patient was complaining of some dysuria. Her home health nurse called the patient's PCP. The patient was prescribed ciprofloxacin. Since that date, the patient has had increasing somnolence which was significantly worse on 04/03/2019. During this past week, the patient has had significantly decreased oral intake. According to the patient's family, the patient was somnolent and slept most of the day. When she woke up on 04/04/2019, the patient complained of chest discomfort. As result, EMS was activated. During this past week, the patient has not had any recurrent discomfort or complained of any chronic worsening cough or shortness of breath. The patient did have a low-grade temperature of 100.2 F on 04/03/2019. There is been no complaints of headaches, nausea, vomiting, diarrhea, abdominal pain. According to the patient's daughter, they have been compliant with social distancing in the context of the coronavirus pandemic. There is been no sick contacts. Other than the ciprofloxacin, there has not been any new medications. In the emergency department, the patient had a low-grade temperature of 99.7 F. She was hemodynamically stable. Oxygen saturations were 92--95% on room air. BMP showed a serum creatinine of 3.48 which was significantly above her usual baseline. WBC was 10.2 with hemoglobin 11.3 and platelets 270,000. Chest x-ray showed poor inspiration withoutconsolidation or edema. Renal ultrasound was negative for hydronephrosis but showed right renal atrophy. CT of the brain was negative for acute findings. Lactic acid was 1.8. Urinalysis showed 21-50 WBC. The patient was given a liter bolus of normal saline and started on ceftriaxone.  Hospital Course:  Acute metabolic encephalopathy -Secondary to acute kidney injury and UTI,ciprofloxacin as well  as other  hypnotic medications including oxycodone/gabapentin which have been discontinued -At baseline, the patient has poor functional status requiring assistance with all aspects of activities of daily living -She has been essentially bedbound for the past week -After further discussion with family and following recommendations by palliative care patient has been transition to full comfort care. -All medications and treatment other than to achieve comfort has been discontinued -Continue the use of Celexa daily, started on oxycodone and Ativan as needed.  Acute kidney injury -Secondary to volume depletionand hemodynamic changes (low BPs) -baseline creatinine 0.7-0.8 -serum creatinine peaked 3.64 -Continue avoiding nephrotoxic agents. -Renal ultrasound negative for hydronephrosis -IV fluid has been discontinued. -Plan of care is for comfort.  Pyuria -Concerning for UTI on admission. -She received treatment with Rocephin for 3 days; no significant improvement or changes in condition. -After discussing with family decision has been made to pursuit only comfort care -Antibiotic has been discontinued.  Hypoxia/Acute respiratory failure with hypoxia -due to OSA/OHS and hypoventilation -ABG--7.305/47/82/22 on 2L -oxygen saturation 92-94% on RA; desaturates during sleep -checked D-dimer--0.95 -V/Q scan neg -Continue 2 L oxygen supplementation as part of comfort care.  Essential hypertension -We will discontinue antihypertensive medication in the setting of full comfort care.  Diabetes mellitus type 2, uncontrolled with hyperglycemia/hypoglycemia -Given the patient decreased oral intake and following decision for full comfort only will discontinue insulin therapy.   -Last A1c 8.5.   -With patient has demonstrated intermittent episode of hypoglycemia in the setting of poor oral intake.  Failure to thrive in adult -The patient's daughter relates that functional and cognitive  decline over the past 4 to 5 months has been steadily and progressive. -Goals of care discussed--patient is DNR/DNI -PT evaluation--recommended 24 hour care -Serum B12--724 -Folic acid--20.6 -ammonia < 9 -03/18/2019 TSH 0.558 -patient continues to have poor po intake resulting in hypoglycemia intermittently.  Hyperlipidemia -Statin has been discontinued -Plan of care is for comfort  Hypothyroidism -levothyroxine discontinued -Plan of care is full comfort.  History of multiple strokes -Prior to admission patient was using aspirin and Plavix for secondary prevention -Strokes has been associated with severe right PCA stenosis. -Following discussion with family and current patient's condition; aspirin and Plavix has been discontinued and focus on comfort care only.   Goals of Care. -DNR/DNI -Appreciate assistance and recommendation by palliative care medicine -Given overall decline, failure to thrive and poor functional state decision has been made to pursuit for comfort care.   -Hospice has been involved and patient will be discharge to residential hospice. -For now continue to focus on full comfort care and comfort feeding -not invasive or heroic measures   Stage2sacral decubitus -present prior to admission -Continue preventive measures.  Left wrist pain -Swelling appreciated -Will continue pain medication and using topical Voltaren gel  Procedures:  See below for x-ray reports   Consultations:  Palliative care  Hospice  Discharge Exam: Vitals:   04/09/19 2100 04/10/19 0512  BP:  (!) 160/71  Pulse: 81 77  Resp: 16 16  Temp:    SpO2: 93% 93%   General exam: Alert, awake, oriented x 2; in no major distress.  Patient is afebrile, denies chest pain, no nausea and no vomiting.  Still with poor overall appetite and mainly drinking water/juices.  Complaining of back pain and also left wrist discomfort. Respiratory system: Fair air movement bilaterally, positive  scattered rhonchi.  Continue to use nasal cannula oxygen supplementation intermittently.   Cardiovascular system:RRR. No murmurs, rubs, gallops. Gastrointestinal system: Abdomen is nondistended, soft and  nontender. No organomegaly or masses felt. Normal bowel sounds heard. Central nervous system: Alert and oriented. No focal neurological deficits. Extremities: No cyanosis, no clubbing; trace edema bilaterally appreciated in her lower extremities.  There is left wrist swelling and tenderness to palpation, patient able to hold things with her hands and moving it without too much difficulties. Skin: No rashes, no petechiae, stage II decubitus pressure injury present prior to admission; there is no signs of superimposed infection on exam. Psychiatry: Mood and affect appears to be appropriate.  No suicidal ideation or hallucinations.  Discharge Instructions   Discharge Instructions    Discharge instructions   Complete by:  As directed    Full comfort care and Comfort feeding     Allergies as of 04/10/2019      Reactions   Codeine Itching      Medication List    STOP taking these medications   amLODipine 5 MG tablet Commonly known as:  NORVASC   aspirin EC 81 MG tablet   benzonatate 200 MG capsule Commonly known as:  TESSALON   ciprofloxacin 500 MG tablet Commonly known as:  CIPRO   clopidogrel 75 MG tablet Commonly known as:  PLAVIX   ferrous sulfate 325 (65 FE) MG tablet   gabapentin 300 MG capsule Commonly known as:  NEURONTIN   Insulin Isophane & Regular Human (70-30) 100 UNIT/ML PEN Commonly known as:  HumuLIN 70/30 KwikPen   levothyroxine 25 MCG tablet Commonly known as:  SYNTHROID   lisinopril 10 MG tablet Commonly known as:  ZESTRIL   nitroGLYCERIN 0.4 MG SL tablet Commonly known as:  NITROSTAT   oxyCODONE-acetaminophen 5-325 MG tablet Commonly known as:  PERCOCET/ROXICET   simvastatin 20 MG tablet Commonly known as:  ZOCOR   UNABLE TO FIND     TAKE  these medications   acetaminophen 325 MG tablet Commonly known as:  TYLENOL Take 2 tablets (650 mg total) by mouth every 6 (six) hours as needed for mild pain or headache (or Fever >/= 101).   citalopram 20 MG tablet Commonly known as:  CELEXA Take 1 tablet (20 mg total) by mouth daily.   diclofenac sodium 1 % Gel Commonly known as:  VOLTAREN Apply 2 g topically 4 (four) times daily as needed (wrist pain). Apply to left wrist   docusate sodium 100 MG capsule Commonly known as:  COLACE Take 100 mg by mouth daily.   ipratropium-albuterol 0.5-2.5 (3) MG/3ML Soln Commonly known as:  DUONEB Inhale 3 mLs into the lungs 4 (four) times daily as needed (for shortness of breath).   LORazepam 0.5 MG tablet Commonly known as:  ATIVAN Take 1 tablet (0.5 mg total) by mouth every 6 (six) hours as needed for anxiety.   oxyCODONE 5 MG immediate release tablet Commonly known as:  Oxy IR/ROXICODONE Take 1-2 tablets (5-10 mg total) by mouth every 6 (six) hours as needed for moderate pain or severe pain. What changed:    how much to take  reasons to take this   pantoprazole 40 MG tablet Commonly known as:  PROTONIX Take 1 tablet (40 mg total) by mouth daily at 6 (six) AM.      Allergies  Allergen Reactions  . Codeine Itching    The results of significant diagnostics from this hospitalization (including imaging, microbiology, ancillary and laboratory) are listed below for reference.    Significant Diagnostic Studies: Dg Chest 1 View  Result Date: 04/05/2019 CLINICAL DATA:  83 year old female with a history of hypoxia EXAM:  CHEST  1 VIEW COMPARISON:  04/04/2019, 03/17/2019, CT chest 03/17/2019 FINDINGS: Cardiomediastinal silhouette unchanged in size and contour with surgical changes of median sternotomy and CABG. Low lung volumes. Interlobular septal thickening with coarsened interstitial markings. Similar appearance of linear opacity on the right and linear opacities at the left lung  base. Blunting of the costophrenic angle bilaterally with meniscus on the right. IMPRESSION: Similar appearance of low lung volumes with atelectasis and evidence of edema. Small right pleural effusion and associated atelectasis/consolidation. Surgical changes of median sternotomy and CABG. Electronically Signed   By: Gilmer Mor D.O.   On: 04/05/2019 17:17   Dg Chest 1 View  Result Date: 03/17/2019 CLINICAL DATA:  Altered mental status. EXAM: CHEST  1 VIEW COMPARISON:  02/12/2019 FINDINGS: Postsurgical changes from CABG. The cardiac silhouette is enlarged. There is an apparent widening of the mediastinum, when compared to patient's prior radiographs. There is no evidence of focal airspace consolidation, or pneumothorax. Possible right pleural effusion. Osseous structures are without acute abnormality. Soft tissues are grossly normal. IMPRESSION: 1. Apparent widening of the mediastinum, when compared to patient's prior radiographs. This may be due to poor inspiratory effort, however vascular anomaly can not be excluded. Please correlate to patient's symptomatology. If the patient displays signs of acute aortic syndrome, then CT angiogram of the chest should be performed. Otherwise PA and lateral radiograph the chest may be considered. 2. Possible right pleural effusion. These results were called by telephone at the time of interpretation on 03/17/2019 at 1:43 pm to Dr. Samuel Jester , who verbally acknowledged these results. Electronically Signed   By: Ted Mcalpine M.D.   On: 03/17/2019 13:45   Dg Chest 2 View  Result Date: 04/04/2019 CLINICAL DATA:  83 year old with acute mental status changes that began yesterday. Patient is currently undergoing treatment for a urinary tract infection. Acute chest pain that began earlier today. EXAM: CHEST - 2 VIEW COMPARISON:  CT chest and chest x-ray 03/17/2019 and earlier. FINDINGS: AP ERECT and LATERAL images were obtained. Markedly suboptimal inspiration.  Prior sternotomy for CABG. Cardiac silhouette mildly enlarged, unchanged. Pulmonary vascularity normal without evidence of pulmonary edema. Linear scarring scattered throughout both lungs, unchanged. Lungs otherwise clear. No visible pleural effusions. Prior ORIF of a LEFT humerus fracture. IMPRESSION: Markedly suboptimal inspiration. Stable mild cardiomegaly. No acute cardiopulmonary disease. Electronically Signed   By: Hulan Saas M.D.   On: 04/04/2019 11:11   Dg Pelvis 1-2 Views  Result Date: 03/31/2019 CLINICAL DATA:  Fall 2 days ago with left pelvis/hip and femur pain. Recent fall leading to iliopsoas tendon tear. EXAM: PELVIS - 1-2 VIEW COMPARISON:  Pelvis and hip radiographs 03/17/2019. Pelvic MRI 03/19/2019 FINDINGS: The cortical margins of the bony pelvis are intact. No fracture. Pubic symphysis and sacroiliac joints are congruent. Both femoral heads are well-seated in the respective acetabula. There are vascular calcifications. IMPRESSION: No evidence of pelvic fracture. Electronically Signed   By: Narda Rutherford M.D.   On: 03/31/2019 02:06   Ct Head Wo Contrast  Result Date: 04/04/2019 CLINICAL DATA:  Altered level of consciousness. EXAM: CT HEAD WITHOUT CONTRAST TECHNIQUE: Contiguous axial images were obtained from the base of the skull through the vertex without intravenous contrast. COMPARISON:  CT head 03/31/2019 FINDINGS: Brain: Atrophy and chronic microvascular ischemic changes are stable. No new findings. No acute infarct, hemorrhage, or mass. Vascular: Negative for hyperdense vessel Skull: Negative Sinuses/Orbits: Small air-fluid level posterior ethmoid sinus on the right. Remaining sinuses clear. Bilateral cataract surgery. Other: None  IMPRESSION: No acute abnormality and no change from the recent study. Electronically Signed   By: Marlan Palau M.D.   On: 04/04/2019 10:57   Ct Head Wo Contrast  Result Date: 03/31/2019 CLINICAL DATA:  Initial evaluation for acute trauma, recent  fall. EXAM: CT HEAD WITHOUT CONTRAST TECHNIQUE: Contiguous axial images were obtained from the base of the skull through the vertex without intravenous contrast. COMPARISON:  Prior CT from 03/17/2019. FINDINGS: Brain: Generalized age-related cerebral atrophy with moderate chronic microvascular ischemic disease. No acute intracranial hemorrhage. No acute large vessel territory infarct. No mass lesion, midline shift or mass effect. No hydrocephalus. No extra-axial fluid collection. Vascular: No hyperdense vessel. Scattered vascular calcifications noted within the carotid siphons. Skull: Scalp soft tissues demonstrate no acute finding. Calvarium intact. Sinuses/Orbits: Globes and orbital soft tissues within normal limits. Mild layering opacity within the right sphenoid sinus. Paranasal sinuses are otherwise clear. No mastoid effusion. Other: None. IMPRESSION: 1. No acute intracranial abnormality. 2. Generalized age-related cerebral atrophy with moderate chronic small vessel ischemic disease. 3. Mild right sphenoid sinusitis. Electronically Signed   By: Rise Mu M.D.   On: 03/31/2019 02:00   Ct Head Wo Contrast  Result Date: 03/17/2019 CLINICAL DATA:  Explain confusion. EXAM: CT HEAD WITHOUT CONTRAST CT CERVICAL SPINE WITHOUT CONTRAST TECHNIQUE: Multidetector CT imaging of the head and cervical spine was performed following the standard protocol without intravenous contrast. Multiplanar CT image reconstructions of the cervical spine were also generated. COMPARISON:  Brain MRI 02/17/2019, cervical spine MRI 02/18/2019 FINDINGS: CT HEAD FINDINGS Brain: No evidence of acute infarction, hemorrhage, hydrocephalus, extra-axial collection or mass lesion/mass effect. Moderate brain parenchymal volume loss and deep white matter microangiopathy. Vascular: Calcific atherosclerotic disease of the intra cavernous carotid arteries. Skull: Normal. Negative for fracture or focal lesion. Sinuses/Orbits: No acute finding.  Other: None CT CERVICAL SPINE FINDINGS Alignment: Normal. Skull base and vertebrae: No acute fracture. No primary bone lesion or focal pathologic process. Soft tissues and spinal canal: No prevertebral fluid or swelling. No visible canal hematoma. Disc levels:  Multilevel osteoarthritic changes. Upper chest: Negative. Other: IMPRESSION: 1. No acute intracranial abnormality. 2. Atrophy, chronic microvascular disease. 3. No evidence of acute traumatic injury to cervical spine. 4. Multilevel osteoarthritic changes of the cervical spine. Electronically Signed   By: Ted Mcalpine M.D.   On: 03/17/2019 13:28   Ct Cervical Spine Wo Contrast  Result Date: 03/17/2019 CLINICAL DATA:  Explain confusion. EXAM: CT HEAD WITHOUT CONTRAST CT CERVICAL SPINE WITHOUT CONTRAST TECHNIQUE: Multidetector CT imaging of the head and cervical spine was performed following the standard protocol without intravenous contrast. Multiplanar CT image reconstructions of the cervical spine were also generated. COMPARISON:  Brain MRI 02/17/2019, cervical spine MRI 02/18/2019 FINDINGS: CT HEAD FINDINGS Brain: No evidence of acute infarction, hemorrhage, hydrocephalus, extra-axial collection or mass lesion/mass effect. Moderate brain parenchymal volume loss and deep white matter microangiopathy. Vascular: Calcific atherosclerotic disease of the intra cavernous carotid arteries. Skull: Normal. Negative for fracture or focal lesion. Sinuses/Orbits: No acute finding. Other: None CT CERVICAL SPINE FINDINGS Alignment: Normal. Skull base and vertebrae: No acute fracture. No primary bone lesion or focal pathologic process. Soft tissues and spinal canal: No prevertebral fluid or swelling. No visible canal hematoma. Disc levels:  Multilevel osteoarthritic changes. Upper chest: Negative. Other: IMPRESSION: 1. No acute intracranial abnormality. 2. Atrophy, chronic microvascular disease. 3. No evidence of acute traumatic injury to cervical spine. 4.  Multilevel osteoarthritic changes of the cervical spine. Electronically Signed   By: Sharlyne Pacas  Dimitrova M.D.   On: 03/17/2019 13:28   US Renal  Result Date: 04/04/2019 CLINICAL DATA:  Acute kidney injury EXAM: RENAL / URINARY TRACT ULTRASOUND COMPLETE COMPARISON:  05/19/2017 CT abdomen/pelvis. FINDINGS: Right Kidney: Renal measurements: 10.0 x 4.9 x 5.0 cm = volume: 129 mL. No hydronephrosis. Mildly echogenic and mildly atrophic right renal parenchyma. No right renal mass. Left Kidney: Renal measurements: 12.7 x 6.1 x 6.2 cm = volume: 251 mL. No hydronephrosis. Mildly echogenic renal parenchyma, normal in thickness. Simple appearing 2.2 x 1.9 x 1.9 cm interpolar left renal cyst. Bladder: Appears normal for degree of bladder distention. Ureteral jets are not visualized in the bladder on color Doppler. IMPRESSION: 1. No hydronephrosis. 2. Asymmetric right renal atrophy. 3. Echogenic kidneys, indicative of nonspecific renal parenchymal disease of uncertain chronicity. 4. Simple appearing left renal cyst. 5. Normal bladder. Electronically Signed   By: Delbert Phenix M.D.   On: 04/04/2019 12:20   Mr Hip Left Wo Contrast  Result Date: 03/19/2019 CLINICAL DATA:  Left hip pain and difficulty standing. Injury history is uncertain. EXAM: MR OF THE LEFT HIP WITHOUT CONTRAST TECHNIQUE: Multiplanar, multisequence MR imaging was performed. No intravenous contrast was administered. COMPARISON:  Plain films left hip 03/17/2019. FINDINGS: Bones: No fracture, dislocation or focal lesion. No worrisome lesion. Lower lumbar spondylosis with associated degenerative endplate signal change is noted. Articular cartilage and labrum Articular cartilage:  Appears preserved. Labrum:  The anterior, superior labrum is degenerated. Joint or bursal effusion Joint effusion:  None. Bursae: Negative. Muscles and tendons Muscles and tendons: The patient has a complete tear of the left iliopsoas tendon from the lesser trochanter. The tendon is  retracted 3-4 cm. Edema is seen about the distal tendon and tracks into the muscle belly most consistent with acute or early subacute tear. There is also some edema in the left adductor magnus compatible with strain without tear. Tendinosis of the hamstrings at their origins is worse on the left. Partial tear of the left hamstrings off the ischial tuberosity measures 1.4 cm AP with little to no retraction. Other findings Miscellaneous: Imaged intrapelvic contents demonstrate no acute abnormality. IMPRESSION: Negative for fracture or dislocation. Complete tear of the left ileus psoas tendon from the lesser tuberosity with 3-4 cm of retraction appears acute or early subacute. Chronic appearing tendinosis and partial tear of the left hamstrings from their origin. Edema in the left adductor magnus compatible with strain without tear. Electronically Signed   By: Drusilla Kanner M.D.   On: 03/19/2019 13:22   Nm Pulmonary Perf And Vent  Result Date: 04/05/2019 CLINICAL DATA:  Hypoxia, chest pain EXAM: NUCLEAR MEDICINE PERFUSION LUNG SCAN TECHNIQUE: Perfusion images were obtained in multiple projections after intravenous injection of Tc-14m MAA. RADIOPHARMACEUTICALS:  4.5 mCi Tc3m MAA IV COMPARISON:  None. FINDINGS: Perfusion: No wedge shaped peripheral perfusion defects to suggest acute pulmonary embolism. IMPRESSION: No scintigraphic evidence of pulmonary embolus. Electronically Signed   By: Elige Ko   On: 04/05/2019 17:23   Dg Humerus Left  Result Date: 03/31/2019 CLINICAL DATA:  Initial evaluation for acute trauma, fall. EXAM: LEFT HUMERUS - 2+ VIEW COMPARISON:  None. FINDINGS: There is no evidence of fracture or other focal bone lesions. Mild degenerative changes noted about the shoulder and elbow. Soft tissues are unremarkable. IMPRESSION: No acute osseous abnormality about the left humerus. Electronically Signed   By: Rise Mu M.D.   On: 03/31/2019 02:06   Ct Angio Chest/abd/pel For  Dissection W And/or W/wo  Result Date:  03/17/2019 CLINICAL DATA:  83 year old female complaining of back pain. Recent history of fall. EXAM: CT ANGIOGRAPHY CHEST, ABDOMEN AND PELVIS TECHNIQUE: Multidetector CT imaging through the chest, abdomen and pelvis was performed using the standard protocol during bolus administration of intravenous contrast. Multiplanar reconstructed images and MIPs were obtained and reviewed to evaluate the vascular anatomy. CONTRAST:  100mL OMNIPAQUE IOHEXOL 350 MG/ML SOLN COMPARISON:  No prior chest CT.  CT abdomen and pelvis 05/19/2017. FINDINGS: CTA CHEST FINDINGS Cardiovascular: Heart size is mildly enlarged. There is no significant pericardial fluid, thickening or pericardial calcification. There is aortic atherosclerosis, as well as atherosclerosis of the great vessels of the mediastinum and the coronary arteries, including calcified atherosclerotic plaque in the left main, left anterior descending, left circumflex and right coronary arteries. Status post median sternotomy for CABG including LIMA to the LAD. On precontrast images there is no crescentic high attenuation associated with the wall of the thoracic aorta to suggest acute intramural hemorrhage. No aortic aneurysm or dissection. Calcifications of the aortic valve and mitral annulus. Mediastinum/Nodes: No pathologically enlarged mediastinal or hilar lymph nodes. Esophagus is normal in appearance. No axillary lymphadenopathy. Lungs/Pleura: Scattered areas of linear scarring are noted in the lungs bilaterally, most evident near the lung apices and in the lung bases. Areas of mild dependent subsegmental atelectasis also noted in the lower lobes of the lungs bilaterally. No acute consolidative airspace disease. No pleural effusions. No definite suspicious appearing pulmonary nodules or masses are noted. Musculoskeletal: Median sternotomy wires. There are no aggressive appearing lytic or blastic lesions noted in the visualized  portions of the skeleton. Review of the MIP images confirms the above findings. CTA ABDOMEN AND PELVIS FINDINGS VASCULAR Aorta: Normal caliber aorta without aneurysm, dissection, vasculitis or significant stenosis. Celiac: Patent without evidence of aneurysm, dissection, vasculitis or significant stenosis. SMA: Patent without evidence of aneurysm, dissection, vasculitis or significant stenosis. Renals: Both renal arteries are patent without evidence of aneurysm, dissection, vasculitis, fibromuscular dysplasia or significant stenosis. IMA: Patent without evidence of aneurysm, dissection, vasculitis or significant stenosis. Inflow: Patent without evidence of aneurysm, dissection, vasculitis or significant stenosis. Veins: No obvious venous abnormality within the limitations of this arterial phase study. Review of the MIP images confirms the above findings. NON-VASCULAR Hepatobiliary: No suspicious cystic or solid hepatic lesions. Status post cholecystectomy. No intrahepatic biliary ductal dilatation. Common bile duct measures 7 mm in the porta hepatis, within normal limits for this post cholecystectomy patient. Pancreas: No pancreatic mass. No pancreatic ductal dilatation. No pancreatic or peripancreatic fluid or inflammatory changes. Spleen: Unremarkable. Adrenals/Urinary Tract: Low-attenuation lesions in both kidneys, compatible with simple cysts, largest of which measures 2.1 cm in the upper pole the left kidney. Multifocal cortical thinning in the right kidney, presumably chronic scarring. No definite suspicious renal lesions. No hydroureteronephrosis. Urinary bladder is normal in appearance. Bilateral adrenal glands are normal in appearance. Stomach/Bowel: Normal appearance of the stomach. No pathologic dilatation of small bowel or colon. The appendix is not confidently identified and may be surgically absent. Regardless, there are no inflammatory changes noted adjacent to the cecum to suggest the presence of an  acute appendicitis at this time. Lymphatic: No lymphadenopathy noted in the abdomen or pelvis. Reproductive: Status post hysterectomy. Ovaries are not confidently identified may be surgically absent or trophic. Other: No significant volume of ascites.  No pneumoperitoneum. Musculoskeletal: Median sternotomy wires. No acute displaced fractures or aggressive appearing lytic or blastic lesions are noted in the visualized portions of the skeleton. Review of the MIP  images confirms the above findings. IMPRESSION: 1. No acute findings are noted in the chest, abdomen or pelvis to account for the patient's symptoms. Specifically, no evidence of acute aortic syndrome. 2. Mild cardiomegaly. 3. Aortic atherosclerosis, in addition to left main and 3 vessel coronary artery disease. Status post median sternotomy for CABG including LIMA to the LAD. 4. There are calcifications of the aortic valve and mitral annulus. Echocardiographic correlation for evaluation of potential valvular dysfunction may be warranted if clinically indicated. 5. Additional incidental findings, as above. Aortic Atherosclerosis (ICD10-I70.0). Electronically Signed   By: Trudie Reed M.D.   On: 03/17/2019 15:50   Dg Femur Min 2 Views Left  Result Date: 03/31/2019 CLINICAL DATA:  Initial evaluation for acute trauma, fall. EXAM: LEFT FEMUR 2 VIEWS COMPARISON:  None. FINDINGS: No acute fracture or dislocation. Osseous mineralization normal. Mild osteoarthritic changes about the hip and knee. No soft tissue abnormality. Prominent atherosclerotic change noted. IMPRESSION: No acute osseous abnormality about the left femur. Electronically Signed   By: Rise Mu M.D.   On: 03/31/2019 01:55   Dg Hips Bilat W Or Wo Pelvis 5 Views  Result Date: 03/17/2019 CLINICAL DATA:  Bilateral hip pain EXAM: DG HIP (WITH OR WITHOUT PELVIS) 5+V BILAT COMPARISON:  None. FINDINGS: There is no evidence of hip fracture or dislocation. There is no evidence of  arthropathy or other focal bone abnormality. Peripheral vascular atherosclerotic disease. IMPRESSION: No acute osseous injury of the bilateral hips. Electronically Signed   By: Elige Ko   On: 03/17/2019 13:34    Microbiology: Recent Results (from the past 240 hour(s))  Culture, blood (Routine x 2)     Status: None   Collection Time: 04/04/19  9:32 AM  Result Value Ref Range Status   Specimen Description   Final    BLOOD LEFT HAND Blood Culture results may not be optimal due to an inadequate volume of blood received in culture bottles BOTTLES DRAWN AEROBIC AND ANAEROBIC   Special Requests NONE  Final   Culture   Final    NO GROWTH 5 DAYS Performed at [facility], [address], [place], [place] [address]    Report Status 04/09/2019 FINAL  Final  Urine Culture     Status: None   Collection Time: 04/04/19  9:32 AM  Result Value Ref Range Status   Specimen Description   Final    URINE, RANDOM Performed at [facility], [address] [place], [place] [address]    Special Requests   Final    NONE Performed at [facility], [address] [place], [place] [address]    Culture   Final    NO GROWTH Performed at [facility], [address], [place], [place] [address]    Report Status 04/05/2019 FINAL  Final  Culture, blood (Routine x 2)     Status: None   Collection Time: 04/04/19  9:37 AM  Result Value Ref Range Status   Specimen Description   Final    BLOOD RIGHT ANTECUBITAL Blood Culture results may not be optimal due to an excessive volume of blood received in culture bottles BOTTLES DRAWN AEROBIC AND ANAEROBIC   Special Requests NONE  Final   Culture   Final    NO GROWTH 5 DAYS Performed at [facility], [address], [place], [place] [address]    Report Status 04/09/2019 FINAL  Final  SARS Coronavirus 2 [facility] order, Performed in [facility] hospital lab)     Status:  None   Collection Time: 04/06/19  1:13 PM  Result Value Ref Range Status   SARS Coronavirus  2 NEGATIVE NEGATIVE Final    Comment: (NOTE) If result is NEGATIVE SARS-CoV-2 target nucleic acids are NOT DETECTED. The SARS-CoV-2 RNA is generally detectable in upper and lower  respiratory specimens during the acute phase of infection. The lowest  concentration of SARS-CoV-2 viral copies this assay can detect is 250  copies / mL. A negative result does not preclude SARS-CoV-2 infection  and should not be used as the sole basis for treatment or other  patient management decisions.  A negative result may occur with  improper specimen collection / handling, submission of specimen other  than nasopharyngeal swab, presence of viral mutation(s) within the  areas targeted by this assay, and inadequate number of viral copies  (<250 copies / mL). A negative result must be combined with clinical  observations, patient history, and epidemiological information. If result is POSITIVE SARS-CoV-2 target nucleic acids are DETECTED. The SARS-CoV-2 RNA is generally detectable in upper and lower  respiratory specimens dur ing the acute phase of infection.  Positive  results are indicative of active infection with SARS-CoV-2.  Clinical  correlation with patient history and other diagnostic information is  necessary to determine patient infection status.  Positive results do  not rule out bacterial infection or co-infection with other viruses. If result is PRESUMPTIVE POSTIVE SARS-CoV-2 nucleic acids MAY BE PRESENT.   A presumptive positive result was obtained on the submitted specimen  and confirmed on repeat testing.  While 2019 novel coronavirus  (SARS-CoV-2) nucleic acids may be present in the submitted sample  additional confirmatory testing may be necessary for epidemiological  and / or clinical management purposes  to differentiate between  SARS-CoV-2 and other Sarbecovirus currently known to infect humans.  If clinically indicated additional testing with an alternate test  methodology  [phone]) is advised. The SARS-CoV-2 RNA is generally  detectable in upper and lower respiratory sp ecimens during the acute  phase of infection. The expected result is Negative. Fact Sheet for Patients:  BoilerBrush.com.cy Fact Sheet for Healthcare Providers: https://pope.com/ This test is not yet approved or cleared by the [place] FDA and has been authorized for detection and/or diagnosis of SARS-CoV-2 by FDA under an Emergency Use Authorization (EUA).  This EUA will remain in effect (meaning this test can be used) for the duration of the COVID-19 declaration under Section 564(b)(1) of the Act, 21 U.S.C. section 360bbb-3(b)(1), unless the authorization is terminated or revoked sooner. Performed at [facility], [address] [place], [place] [address]      Labs: Basic Metabolic Panel: Recent Labs  Lab 04/04/19 0942 04/05/19 0458 04/06/19 0528  NA 139 141 143  K 4.8 4.5 4.7  CL 103 109 111  CO2 25 22 21*  GLUCOSE 146* 118* 126*  BUN 26* 26* 26*  CREATININE 3.48* 3.64* 3.56*  CALCIUM 9.1 8.2* 8.7*  MG  --  1.9  --    Liver Function Tests: Recent Labs  Lab 04/04/19 0942  AST 19  ALT 13  ALKPHOS 53  BILITOT 1.0  PROT 6.3*  ALBUMIN 3.5    Recent Labs  Lab 04/05/19 1214  AMMONIA <9*   CBC: Recent Labs  Lab 04/04/19 0942 04/05/19 0458 04/06/19 0528  WBC 10.2 7.7 7.9  NEUTROABS 7.3  --   --   HGB 11.3* 9.5* 11.2*  HCT 38.1 33.3* 39.3  MCV 84.9 86.5 86.6  PLT 270 244 248   Cardiac Enzymes: Recent Labs  Lab 04/04/19 0944 04/04/19 1529 04/04/19 2228 04/06/19 0528  CKTOTAL  --  98  --  88  TROPONINI <0.03 <0.03 <0.03  --    BNP: BNP (last 3 results) Recent Labs    09/13/18 0026 12/13/18 0419 04/04/19 0944  BNP 63.0 475.0* 265.0*    CBG: Recent Labs  Lab 04/07/19 2108 04/08/19 0745 04/08/19 1114 04/08/19 1629 04/08/19 2113  GLUCAP 141* 129* 142* 114* 148*     Signed:  Vassie Loll MD.  [facility] 04/10/2019, 1:57 PM

## 2019-04-23 DEATH — deceased

## 2019-05-15 IMAGING — CT CT HEAD W/O CM
4 series · 17 of 47 positions shown, 19 images · non-contrast
Comparison: 05/10/2017 at 6125 hours

CLINICAL DATA: Pt has just arrived here from [HOSPITAL] with
neurological status alteration

EXAM:
CT HEAD WITHOUT CONTRAST
TECHNIQUE: Contiguous axial images were obtained from the base of the skull
through the vertex without intravenous contrast.

[Series 3: head without · axial · non-contrast · 0.43mm/px · z∈[-146,-21]mm · 7 of 35 slices shown, 9 images]
[im 5/35  brain]
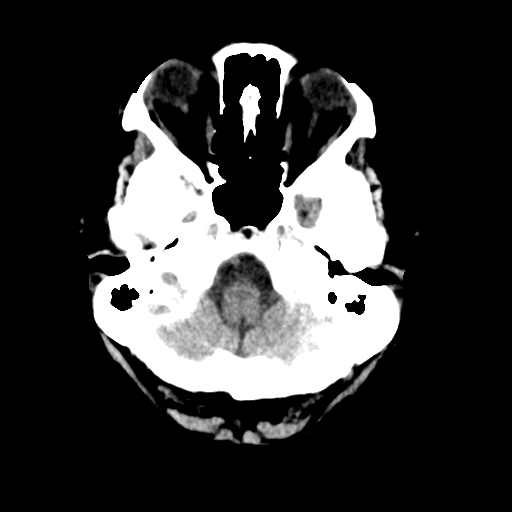
[im 5/35  bone]
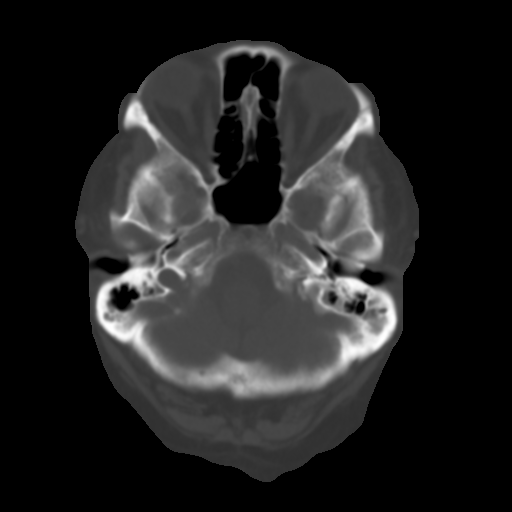
[im 9/35  brain]
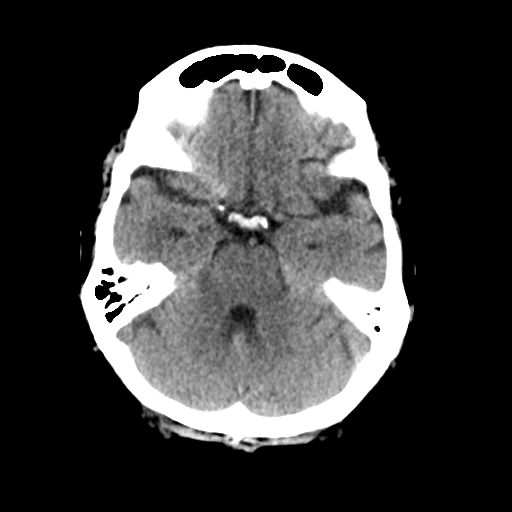
[im 13/35  brain]
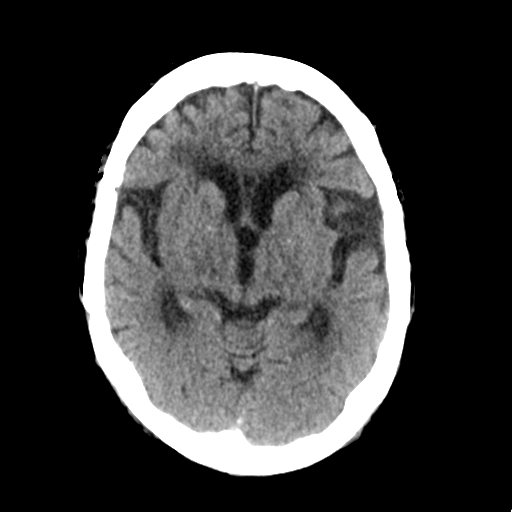
[im 18/35  brain]
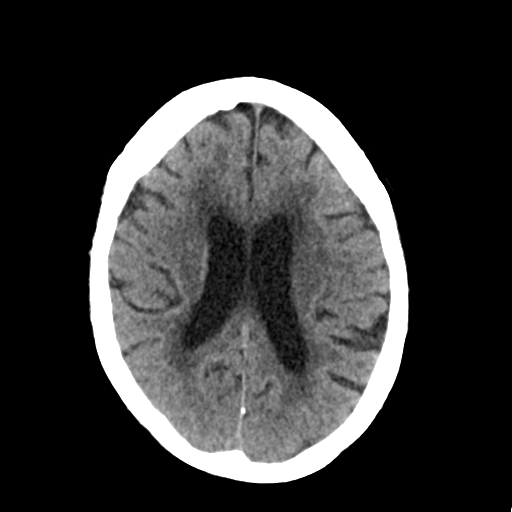
[im 22/35  brain]
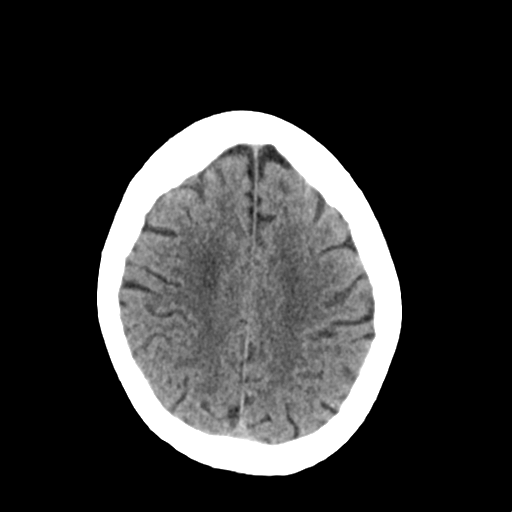
[im 22/35  bone]
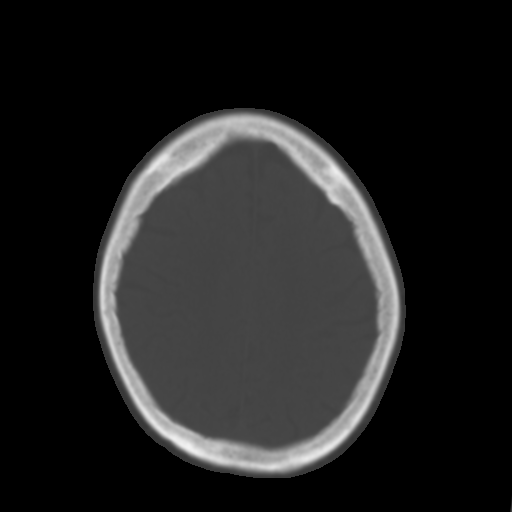
[im 26/35  brain]
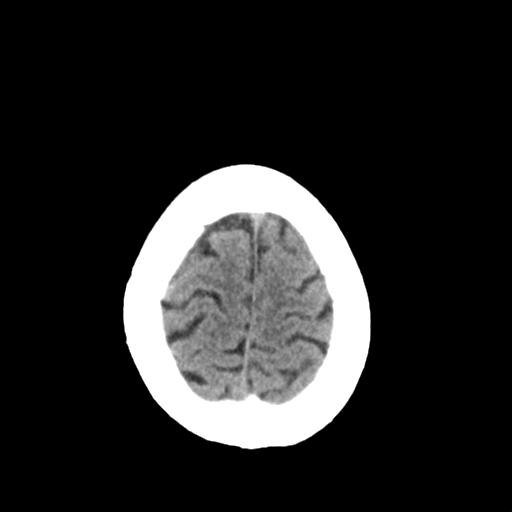
[im 30/35  brain]
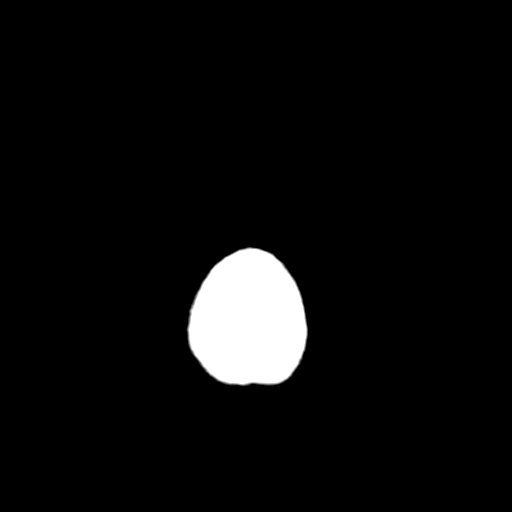

[Series 4: head bone · axial · 0.43mm/px · z∈[-150,-90]mm · 4 of 86 slices shown]
[im 9/86  bone]
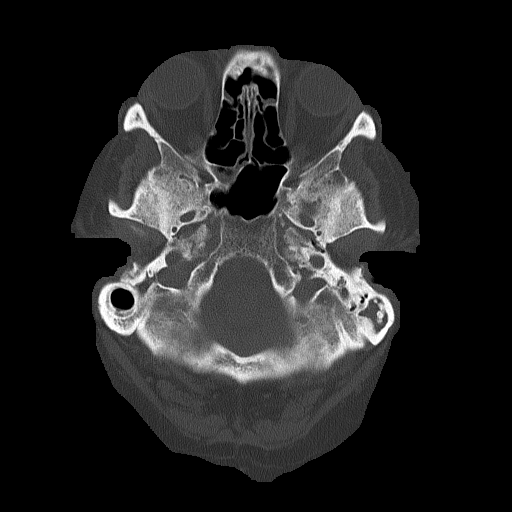
[im 18/86  bone]
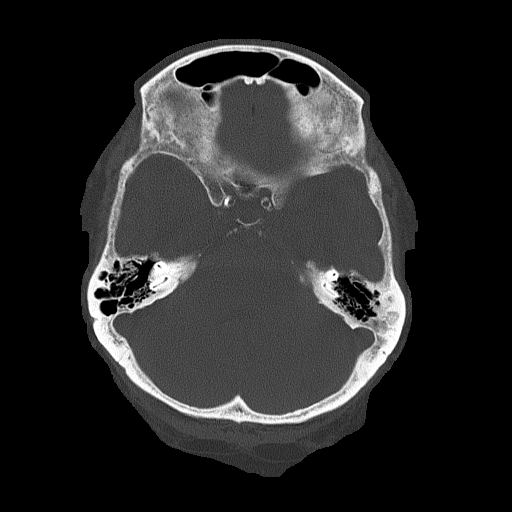
[im 26/86  bone]
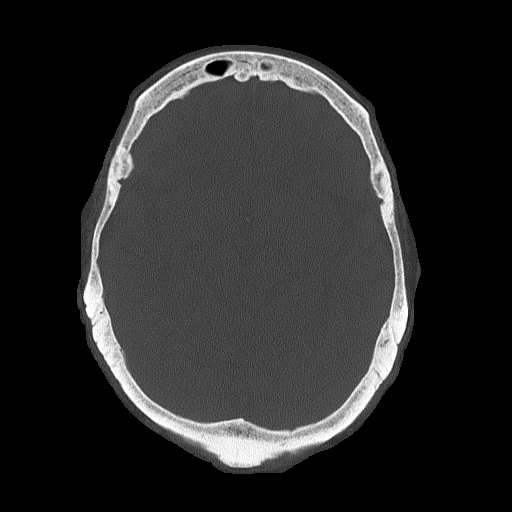
[im 39/86  bone]
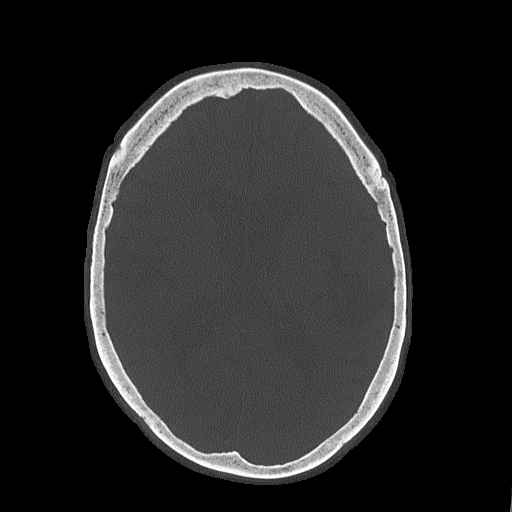

[Series 5: head without cor · coronal · non-contrast · 0.29mm/px · 3 of 67 slices shown]
[im 23/67  brain]
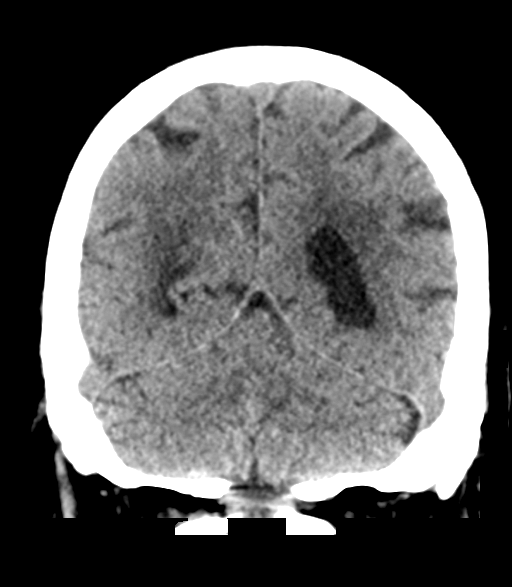
[im 30/67  brain]
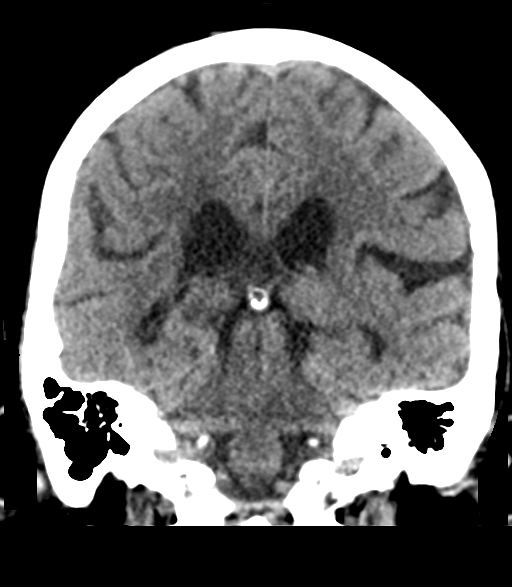
[im 37/67  brain]
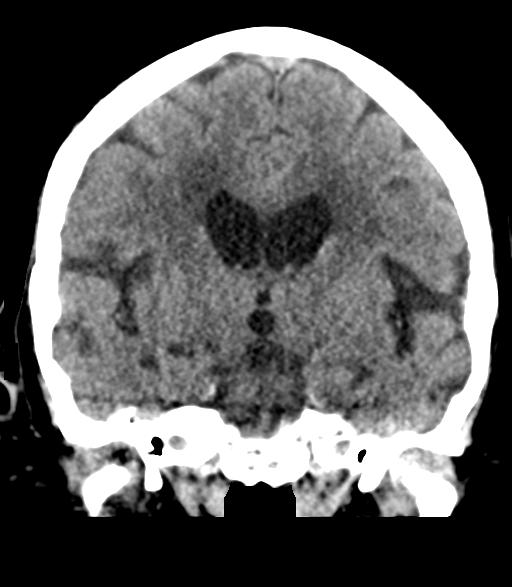

[Series 6: head without sag · sagittal · non-contrast · 0.34mm/px · 3 of 67 slices shown]
[im 23/67  brain]
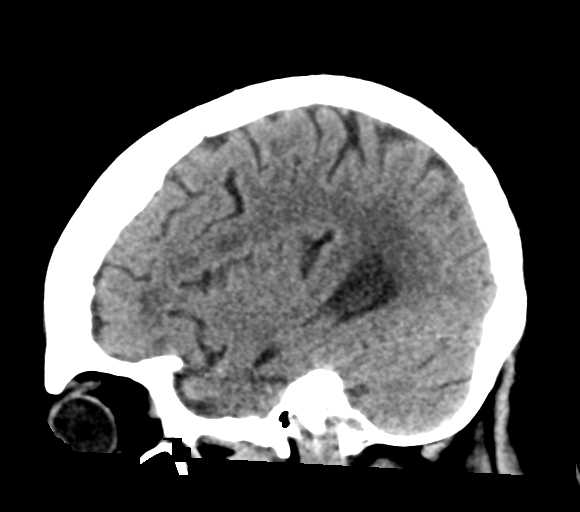
[im 34/67  brain]
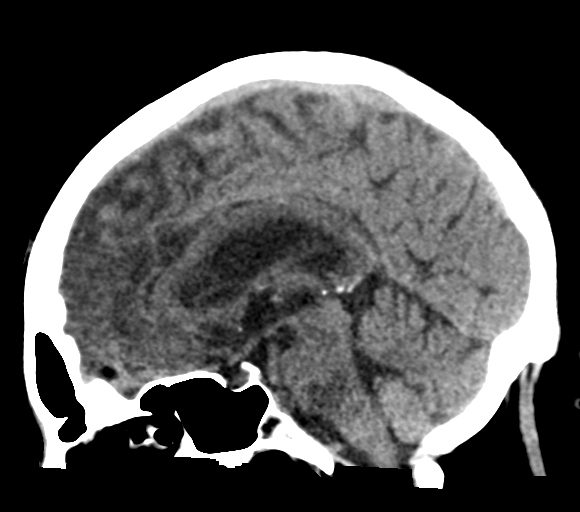
[im 45/67  brain]
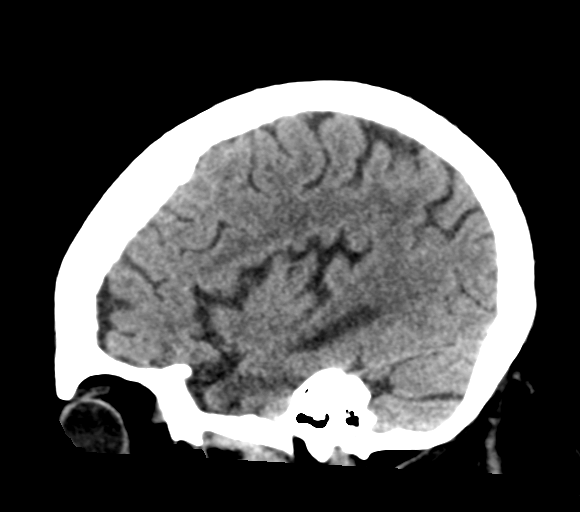

[17 of 47 positions shown; findings below may reference images not displayed]

FINDINGS: Brain: No evidence of acute infarction, hemorrhage, hydrocephalus,
extra-axial collection or mass lesion/mass effect.

There is age related volume loss. Patchy white matter
hypoattenuation is noted consistent with moderate chronic
microvascular ischemic change.

Vascular: No hyperdense vessel or unexpected calcification.

Skull: Normal. Negative for fracture or focal lesion.

Sinuses/Orbits: Globes and orbits are unremarkable. Visualized
sinuses are clear. Clear mastoid air cells.

Other: No change from the earlier study.
IMPRESSION: 1. No acute intracranial abnormalities. Stable appearance from the
study obtained earlier today.

## 2019-05-15 IMAGING — CT CT HEAD W/O CM
3 series · 15 of 47 positions shown, 18 images · non-contrast
Comparison: 09/11/2016

CLINICAL DATA: Left facial numbness and vision loss. Left-sided
headache and nausea beginning today. Code stroke.

EXAM:
CT HEAD WITHOUT CONTRAST
TECHNIQUE: Contiguous axial images were obtained from the base of the skull
through the vertex without intravenous contrast.

[Series 2: head code stroke wo · axial · 0.47mm/px · z∈[+18,+153]mm · 9 of 33 slices shown, 12 images]
[im 3/33  brain]
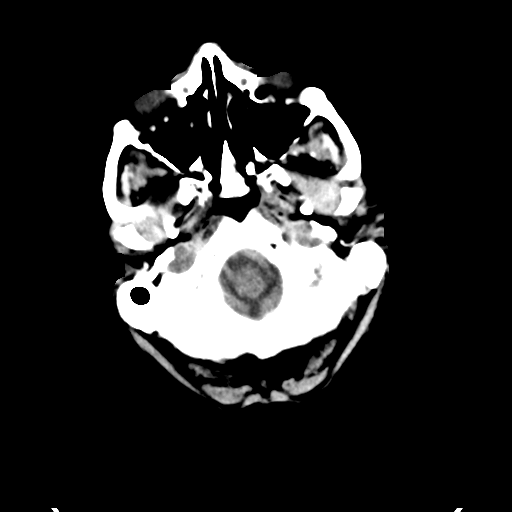
[im 3/33  bone]
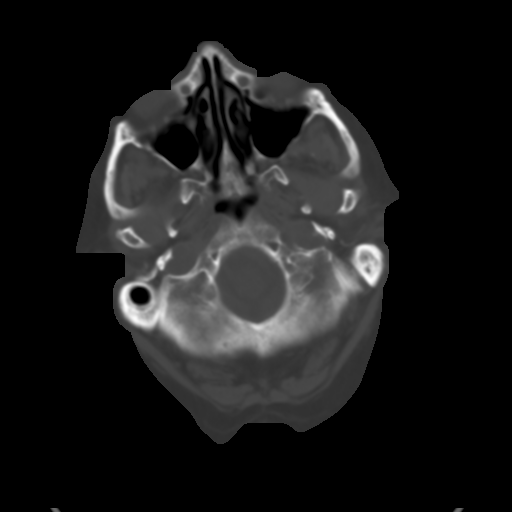
[im 6/33  brain]
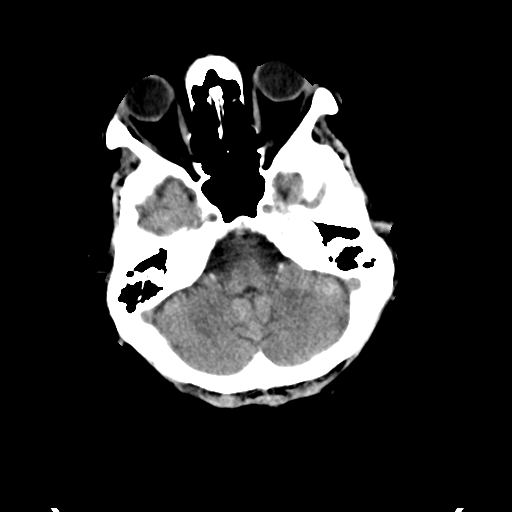
[im 9/33  brain]
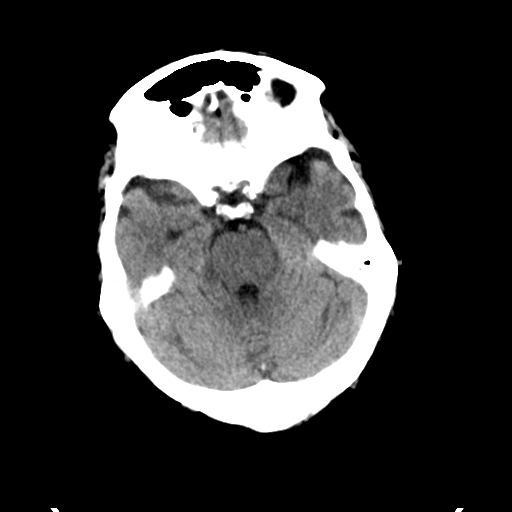
[im 13/33  brain]
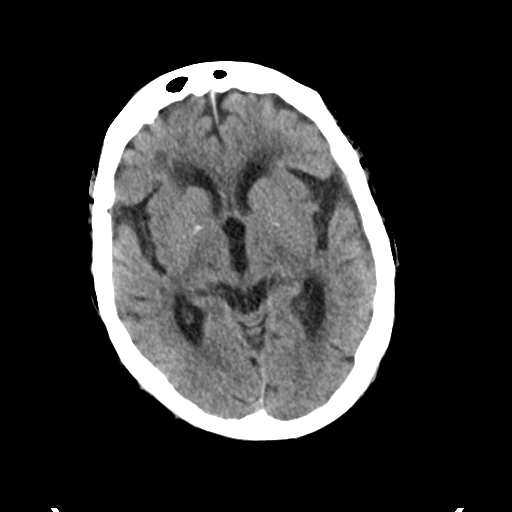
[im 17/33  brain]
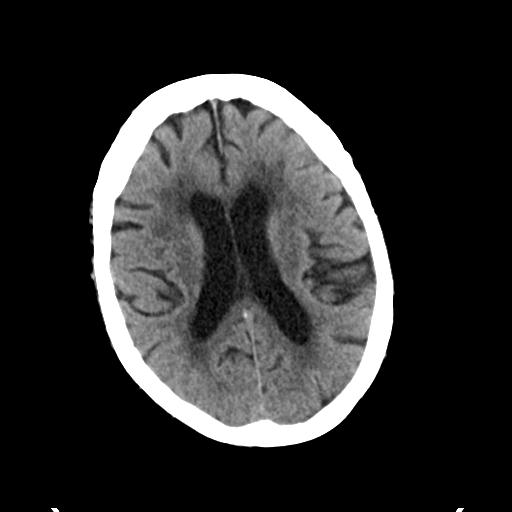
[im 17/33  bone]
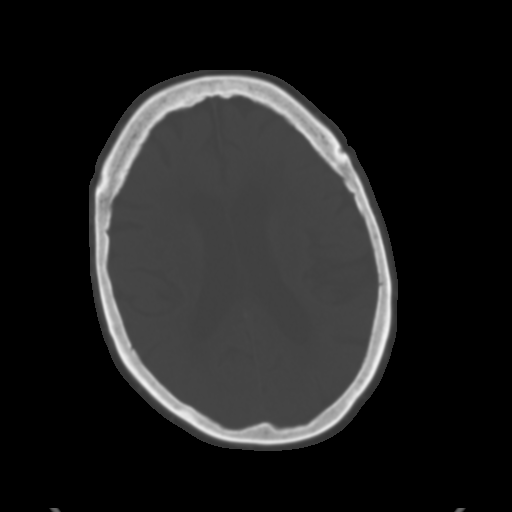
[im 20/33  brain]
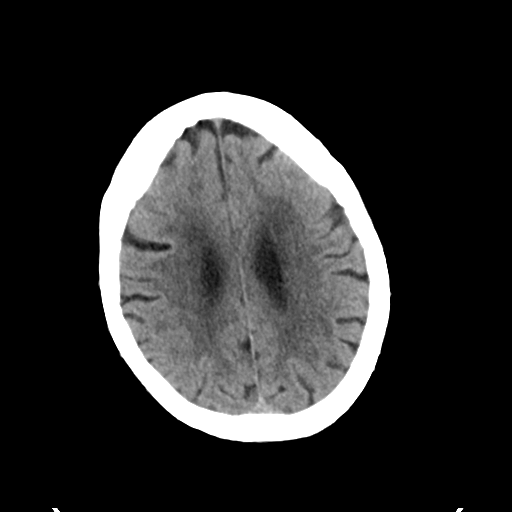
[im 24/33  brain]
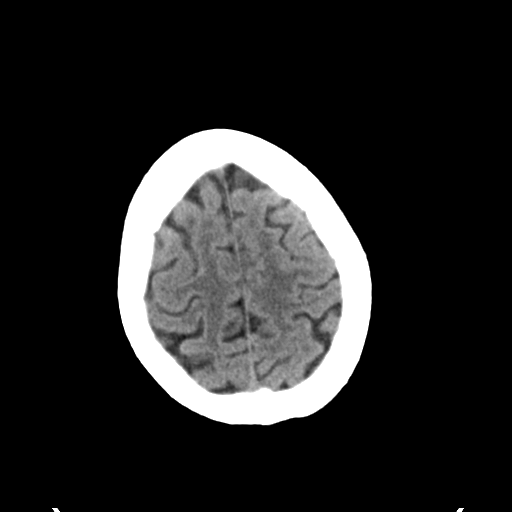
[im 27/33  brain]
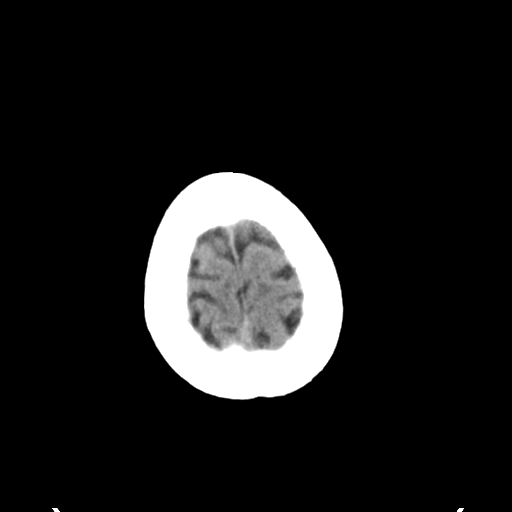
[im 30/33  brain]
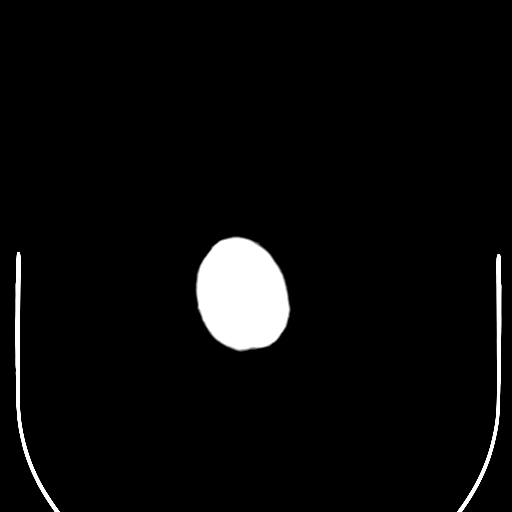
[im 30/33  bone]
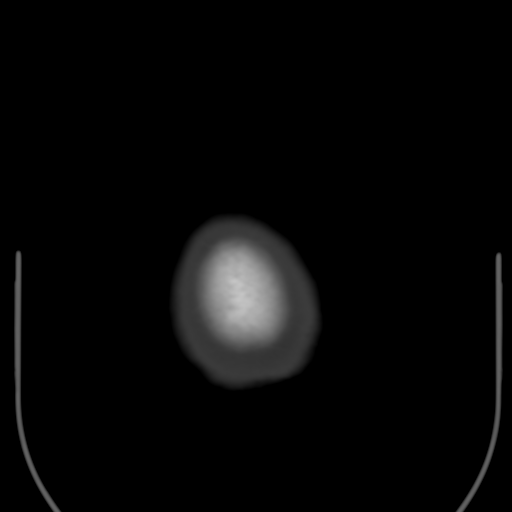

[Series 4: coronal soft tissue · coronal · 0.34mm/px · 3 of 63 slices shown]
[im 21/63  brain]
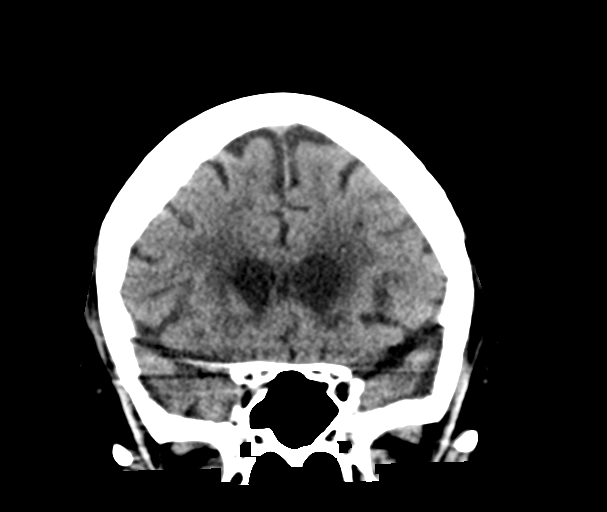
[im 28/63  brain]
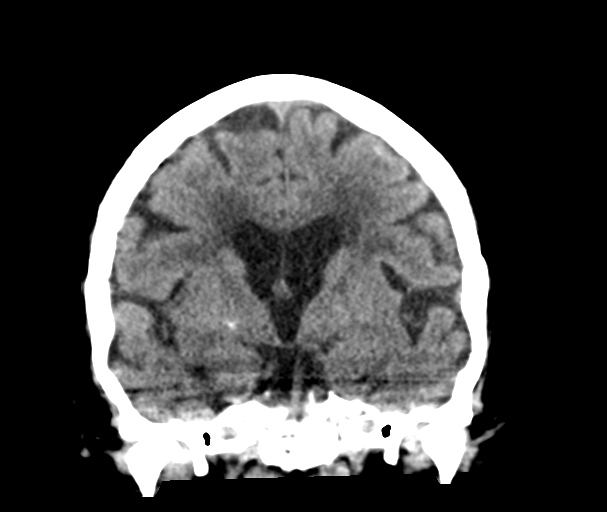
[im 35/63  brain]
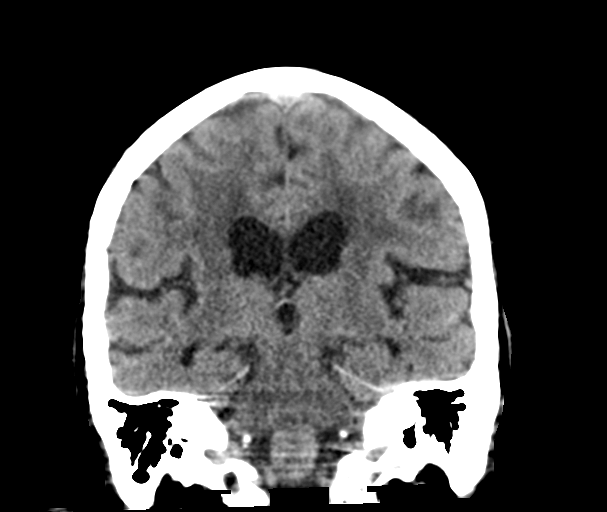

[Series 5: sagittal soft tissue · sagittal · 0.37mm/px · 3 of 59 slices shown]
[im 20/59  brain]
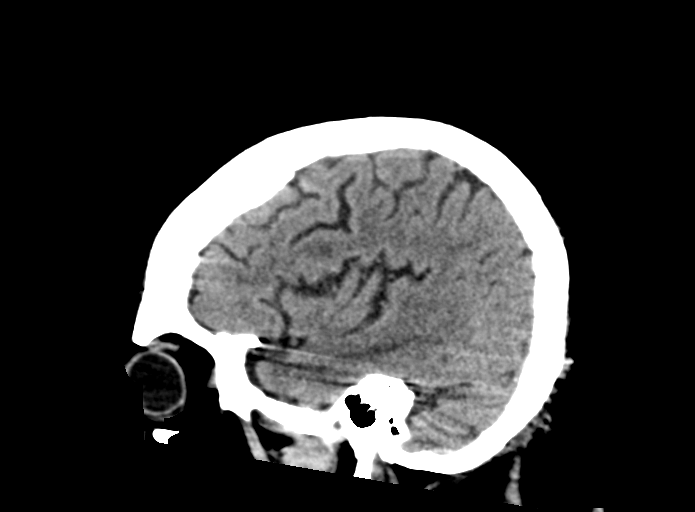
[im 30/59  brain]
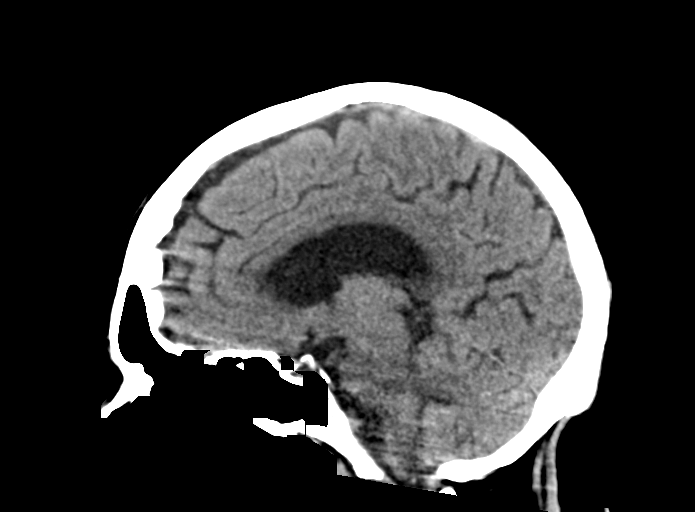
[im 39/59  brain]
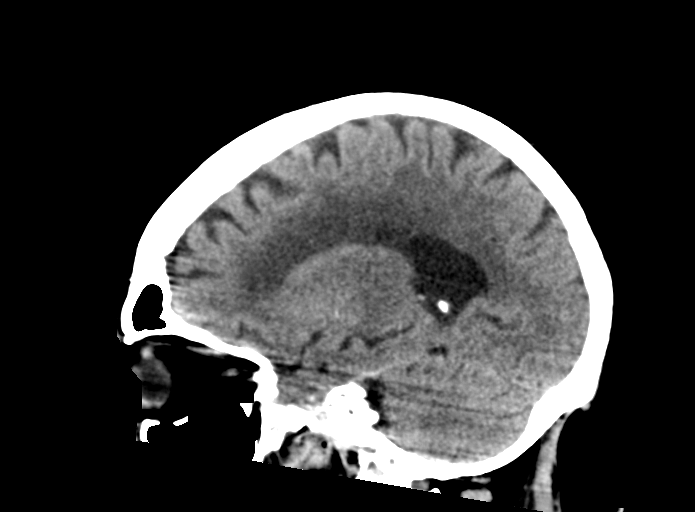

[15 of 47 positions shown; findings below may reference images not displayed]

FINDINGS: Brain: The ventricles, cisterns and other CSF spaces are within
normal. There is moderate chronic ischemic microvascular disease.
There is no mass, mass effect, shift of midline structures or acute
hemorrhage. There is no evidence to suggest acute infarction.
Bilateral basal ganglia calcifications.

Vascular: No hyperdense vessel or unexpected calcification.

Skull: Within normal.

Sinuses/Orbits: Within normal.

Other: None.
IMPRESSION: No acute intracranial findings.

Moderate chronic ischemic microvascular disease.

These results were called by telephone at the time of interpretation
on 05/10/2017 at [DATE] to Dr. Geisha, who verbally acknowledged
these results.

## 2019-05-16 IMAGING — MR MR HEAD W/O CM
9 of 13 series · 30 of 48 positions shown · non-contrast
Comparison: CT head without contrast 05/10/2017. MRI brain
08/02/2016

CLINICAL DATA: Numbness of the left face and arm. Headache and
dizziness.

EXAM:
MRI HEAD WITHOUT CONTRAST
MRA HEAD WITHOUT CONTRAST
TECHNIQUE: Multiplanar, multiecho pulse sequences of the brain and surrounding
structures were obtained without intravenous contrast. Angiographic
images of the head were obtained using MRA technique without
contrast.

[Series 9: DWI · axial · 3.0mm · 1.09mm/px · z∈[-78,+75]mm · 6 of 104 slices shown (1 of 4)]
[im 1/104]
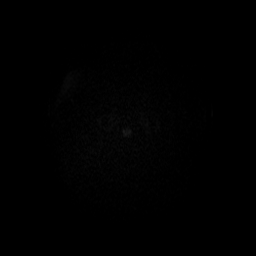
[im 21/104]
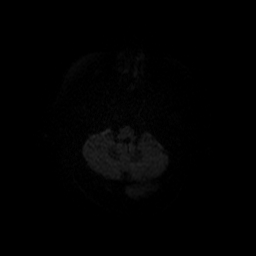
[im 42/104]
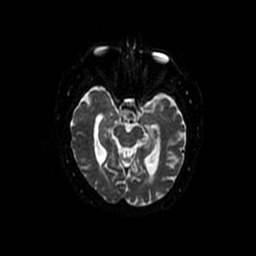
[im 62/104]
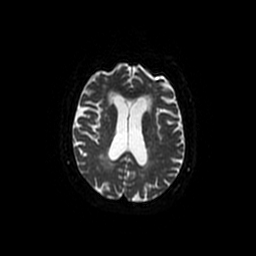
[im 83/104]
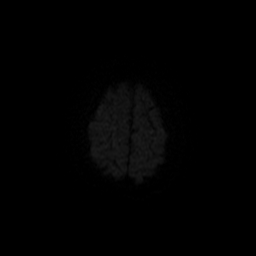
[im 104/104]
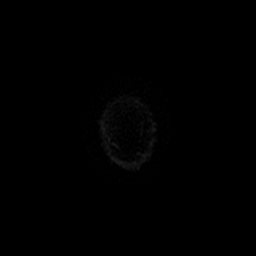

[Series 10: DWI · coronal · 5.0mm · 1.09mm/px · 4 of 76 slices shown (2 of 4)]
[im 1/76]
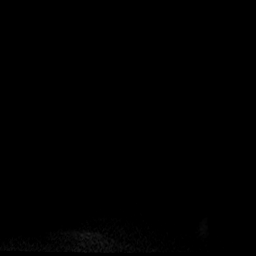
[im 26/76]
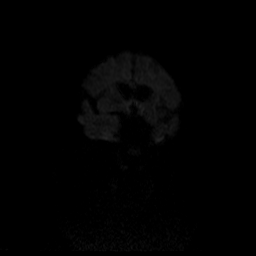
[im 51/76]
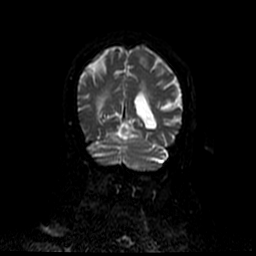
[im 76/76]
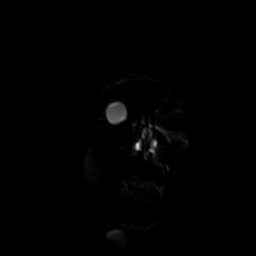

[Series 11: T1 · sagittal · 5.0mm · 0.47mm/px · 2 of 25 slices shown]
[im 1/25]
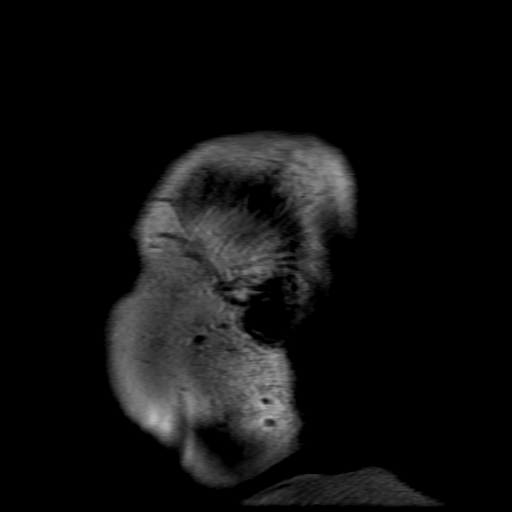
[im 25/25]
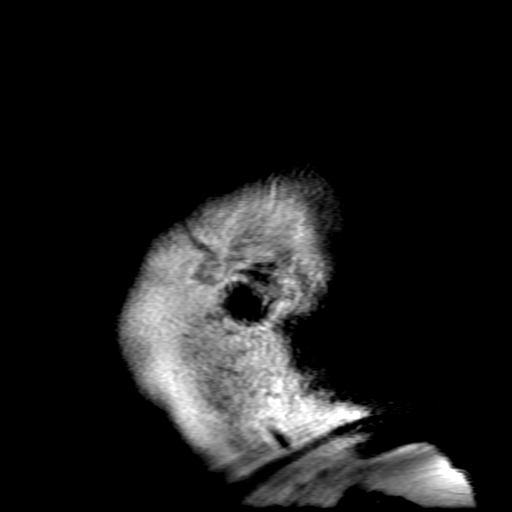

[Series 12: (id) mt fs · axial · 1.4mm · 0.43mm/px · z∈[-74,-11]mm · 5 of 168 slices shown]
[im 1/168]
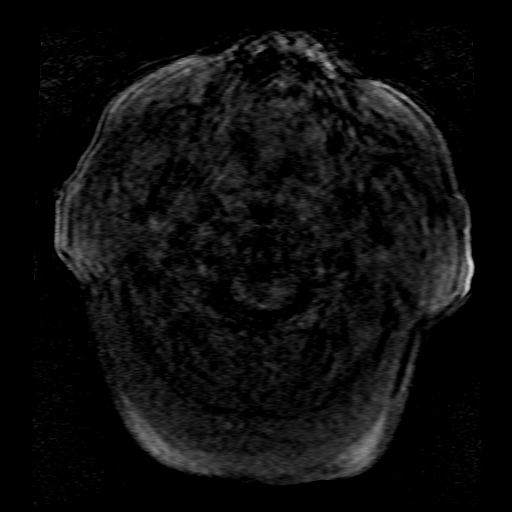
[im 31/168]
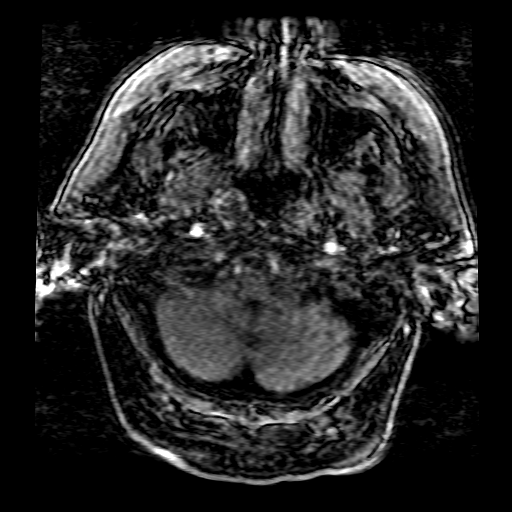
[im 46/168]
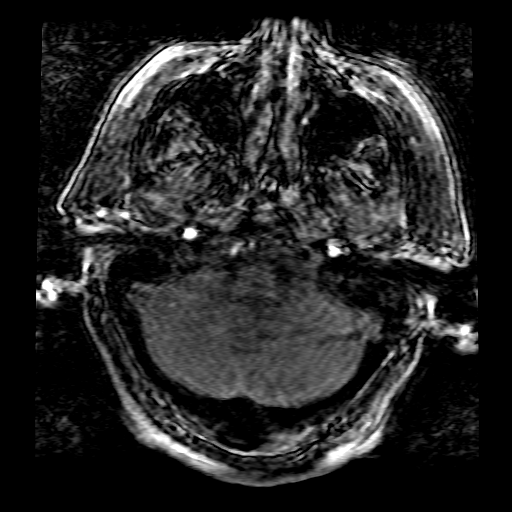
[im 76/168]
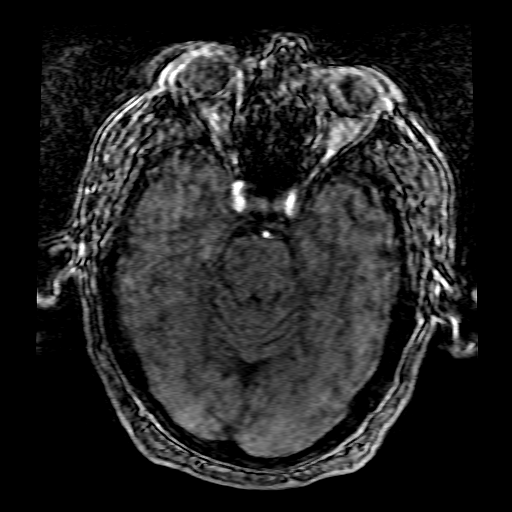
[im 92/168]
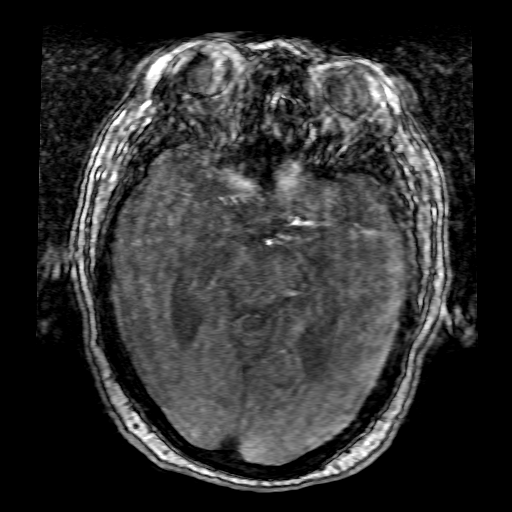

[Series 13: T2 · axial · 5.0mm · 0.43mm/px · z∈[-68,+82]mm · 2 of 26 slices shown]
[im 1/26]
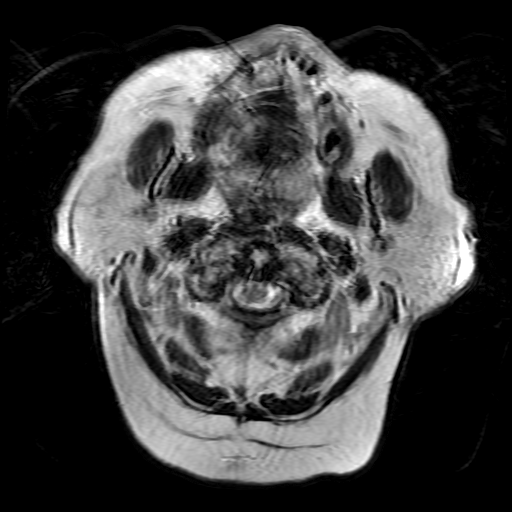
[im 26/26]
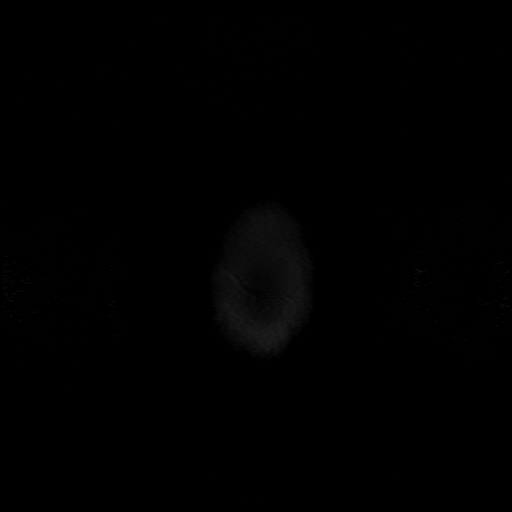

[Series 14: FLAIR · axial · 5.0mm · 0.43mm/px · z∈[-68,+82]mm · 2 of 26 slices shown]
[im 1/26]
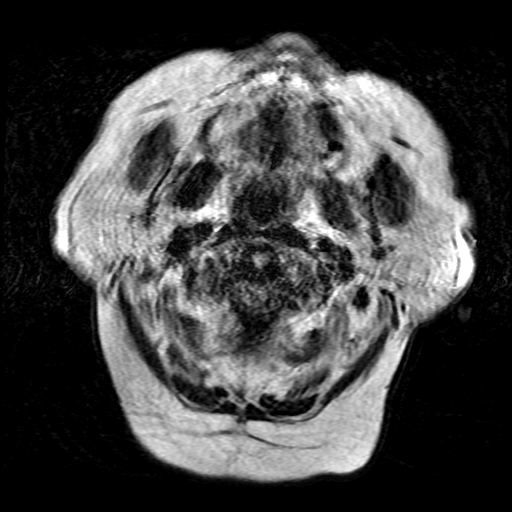
[im 26/26]
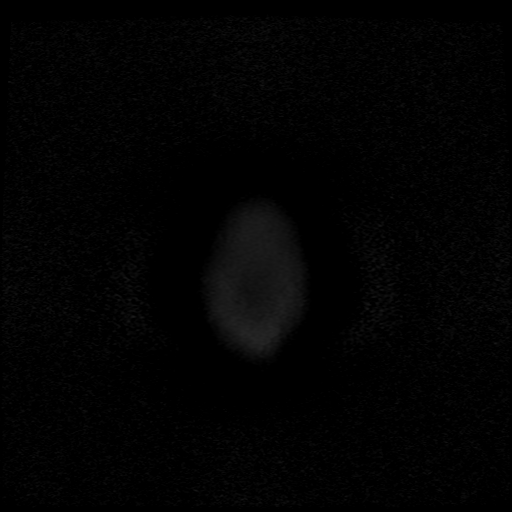

[Series 17: T2 post-contrast · coronal · 5.0mm · 0.39mm/px · 2 of 29 slices shown]
[im 1/29]
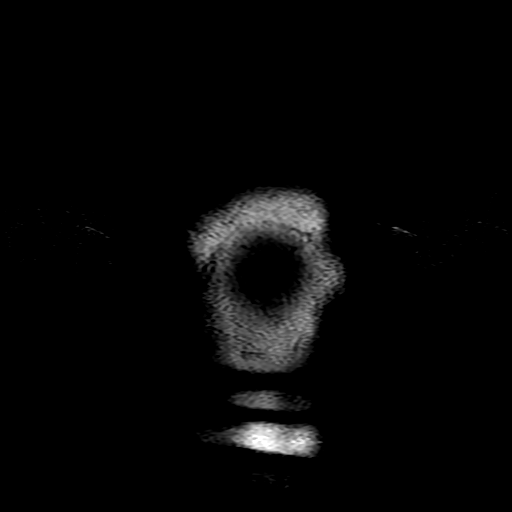
[im 29/29]
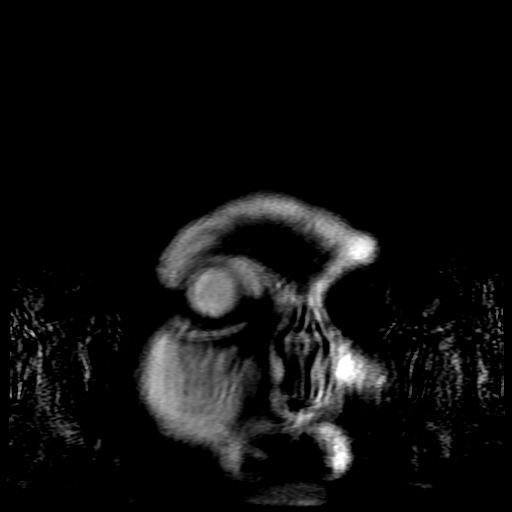

[Series 900: DWI · axial · 3.0mm · 1.09mm/px · z∈[-78,+75]mm · 4 of 52 slices shown (3 of 4)]
[im 1/52]
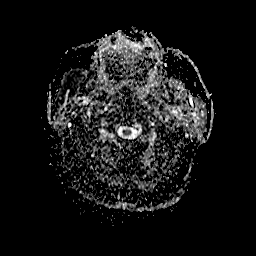
[im 18/52]
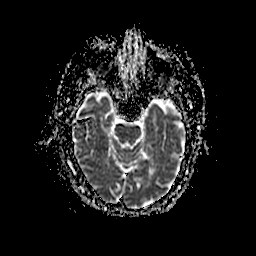
[im 35/52]
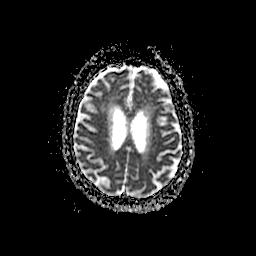
[im 52/52]
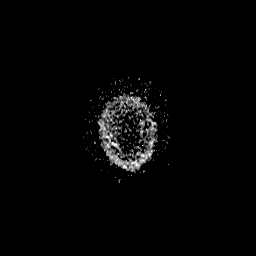

[Series 1000: DWI · coronal · 5.0mm · 1.09mm/px · 3 of 38 slices shown (4 of 4)]
[im 1/38]
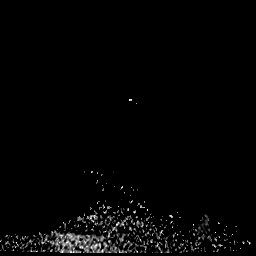
[im 19/38]
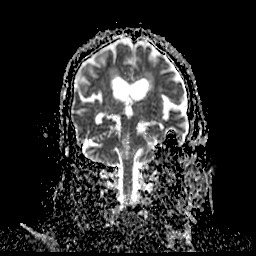
[im 38/38]
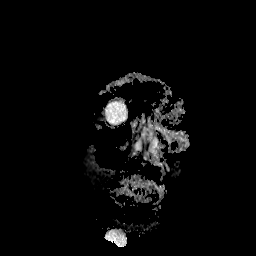

[30 of 48 positions shown; findings below may reference images not displayed]

FINDINGS: MRI HEAD FINDINGS

Brain: The diffusion-weighted images demonstrate no acute or
subacute infarction. Moderate diffuse periventricular white matter
disease is present bilaterally. White matter changes extend of the
brainstem. No significant extra-axial fluid collection is present.
The ventricles are proportionate to the degree of atrophy. Study is
mildly degraded by patient motion.

Vascular: Flow is present in the major intracranial arteries.

Skull and upper cervical spine: The skullbase is within normal
limits. Midline sagittal structures are unremarkable. Craniocervical
junction is normal.

Sinuses/Orbits: The paranasal sinuses are clear. There is some fluid
in the mastoid air cells bilaterally. No obstructing nasopharyngeal
lesion is present. Bilateral lens replacements are present. The
globes and orbits are otherwise unremarkable.

MRA HEAD FINDINGS

The time-of-flight images are severely degraded by patient motion.
The there is some atherosclerotic irregularity within the cavernous
internal carotid arteries bilaterally. Vessels are not well
visualized at the ICA terminus or above. The study is nondiagnostic
above the level of the cavernous sinus.

The vertebral arteries are codominant. Basilar artery is patent. The
posterior cerebral arteries are obscured.
IMPRESSION: 1. No acute intracranial abnormality.
2. The study is severely degraded by patient motion.
3. Extensive white matter disease likely reflects the sequela of
chronic microvascular ischemia.
4. Atherosclerotic changes within the cavernous internal carotid
arteries bilaterally.
5. The vertebrobasilar junction is normal.
6. Circle Willis is obscured by severe patient motion. The
time-of-flight images are nondiagnostic above the level of the
cavernous sinus.

## 2019-05-17 IMAGING — DX DG CHEST 1V PORT
1 series · 1 of 1 positions shown · non-contrast
Comparison: 03/04/2017 .

CLINICAL DATA: Wheezing.  Shortness of breath.

EXAM:
PORTABLE CHEST 1 VIEW

[chest ap]
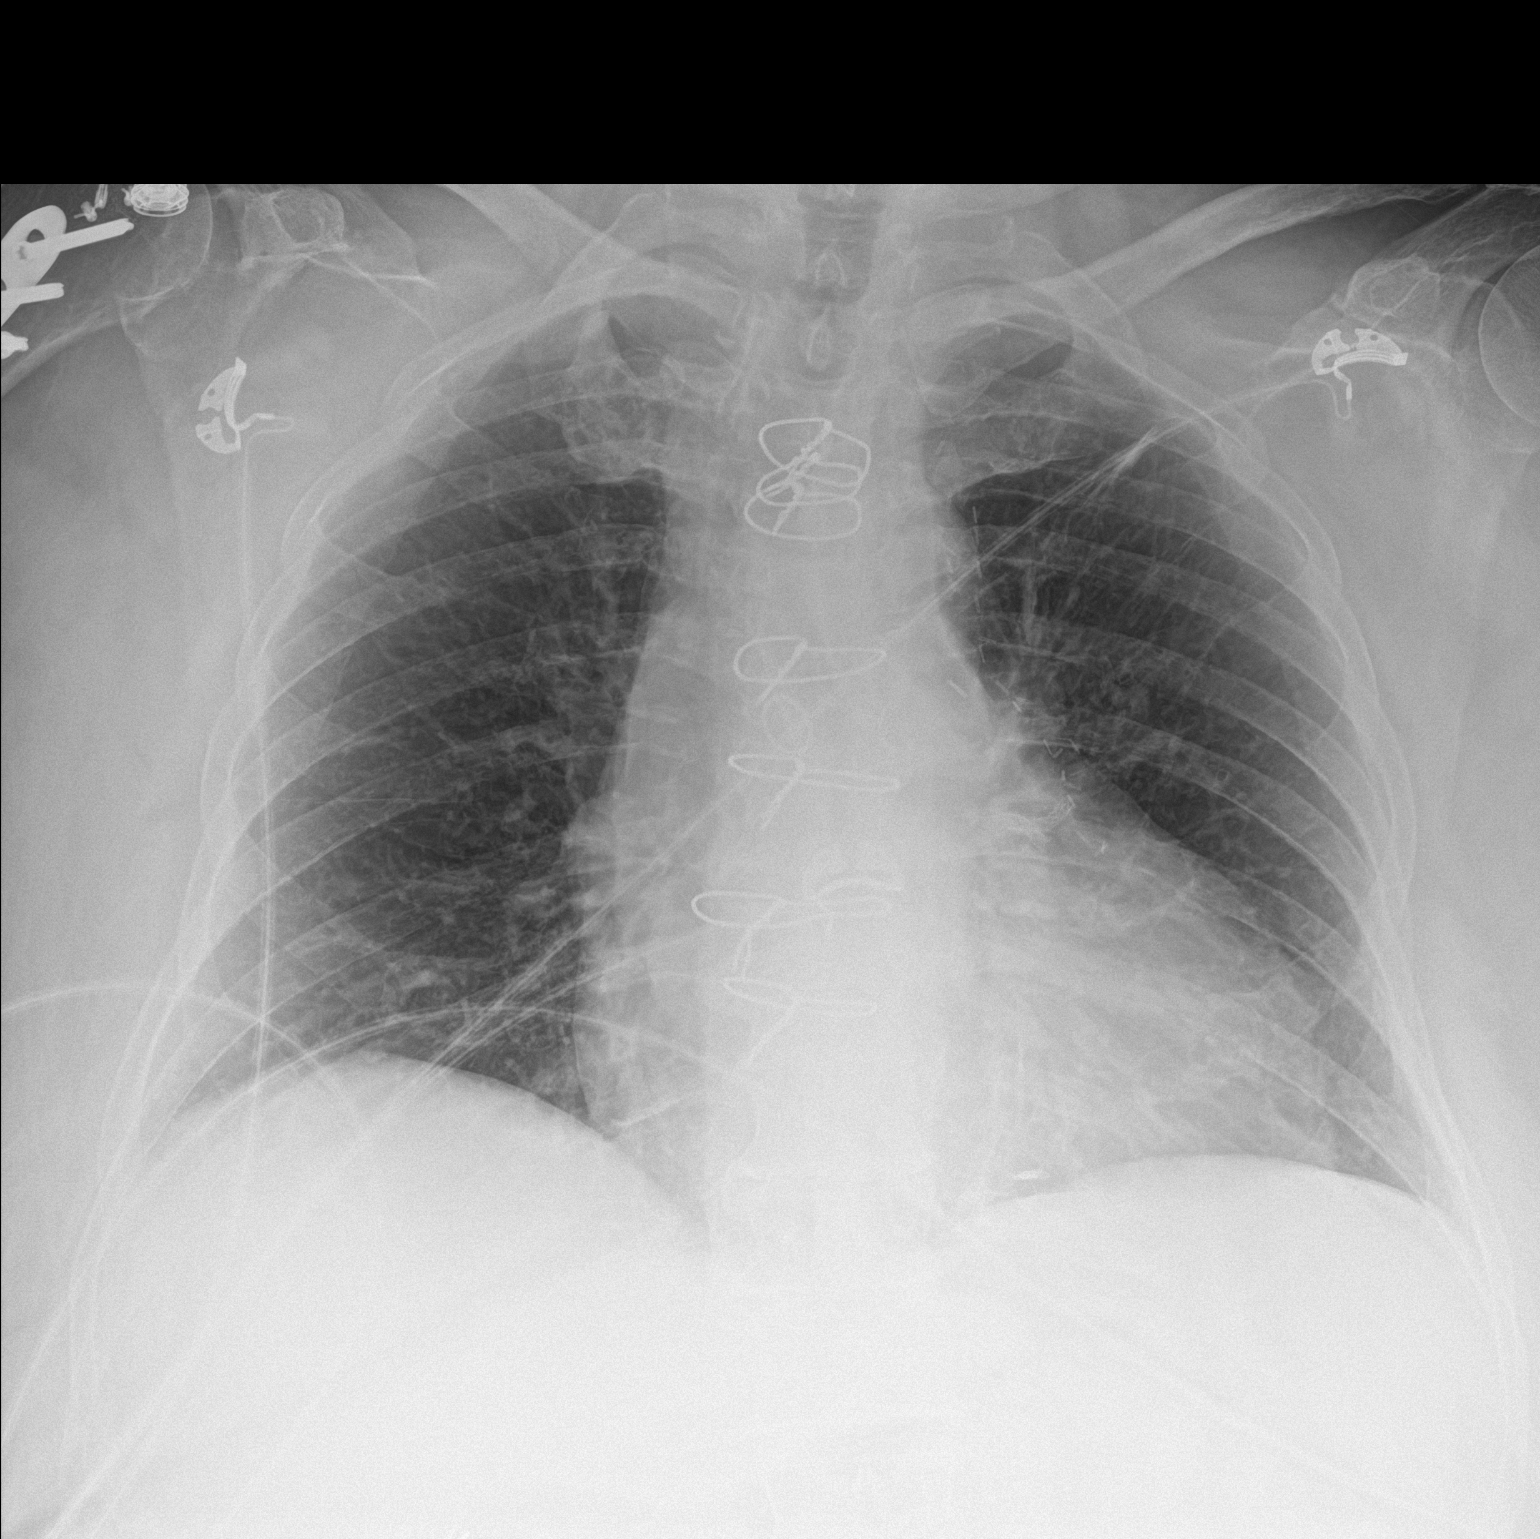

[1 of 1 positions shown; findings below may reference images not displayed]

FINDINGS: Prior CABG. Cardiomegaly with normal pulmonary vascularity. No focal
infiltrate. No pleural effusion or pneumothorax.
IMPRESSION: 1. Prior CABG.  Cardiomegaly.  No evidence of CHF.

2. No acute pulmonary disease .

## 2019-05-24 IMAGING — CT CT RENAL STONE PROTOCOL
2 of 4 series · 17 of 46 positions shown, 19 images · non-contrast
Comparison: 05/12/2014

CLINICAL DATA: Left-sided flank pain

EXAM:
CT ABDOMEN AND PELVIS WITHOUT CONTRAST
TECHNIQUE: Multidetector CT imaging of the abdomen and pelvis was performed
following the standard protocol without IV contrast.

[Series 2: axial st · axial · 0.80mm/px · z∈[+918,+1318]mm · 14 of 88 slices shown, 16 images]
[im 4/88  soft-tissue]
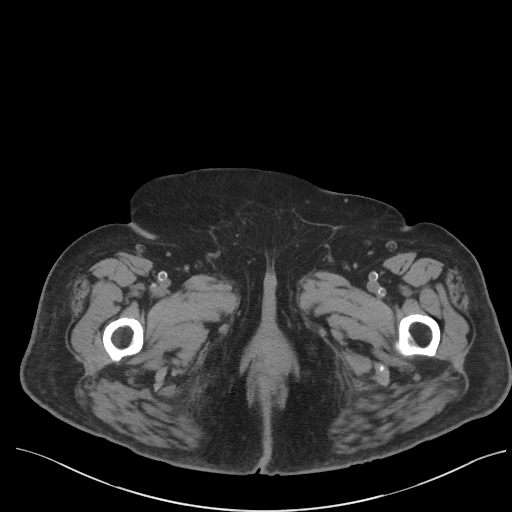
[im 4/88  bone]
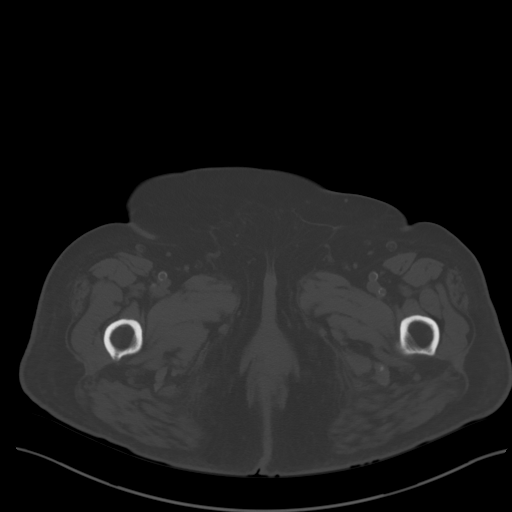
[im 11/88  soft-tissue]
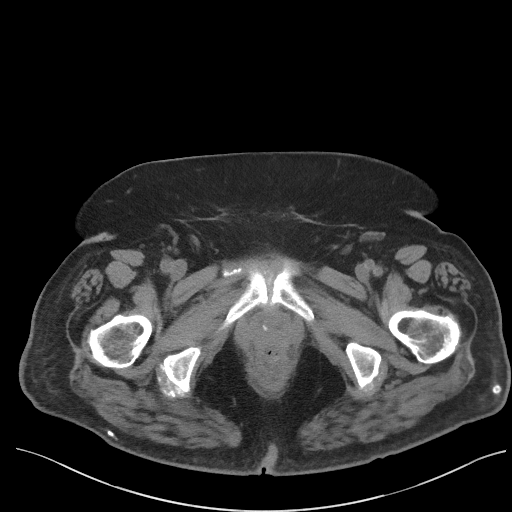
[im 19/88  soft-tissue]
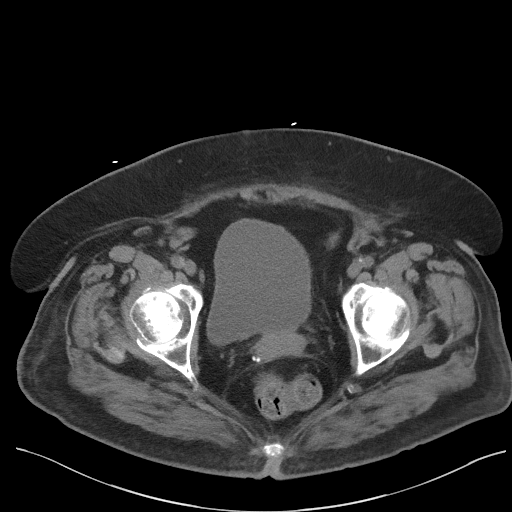
[im 22/88  soft-tissue]
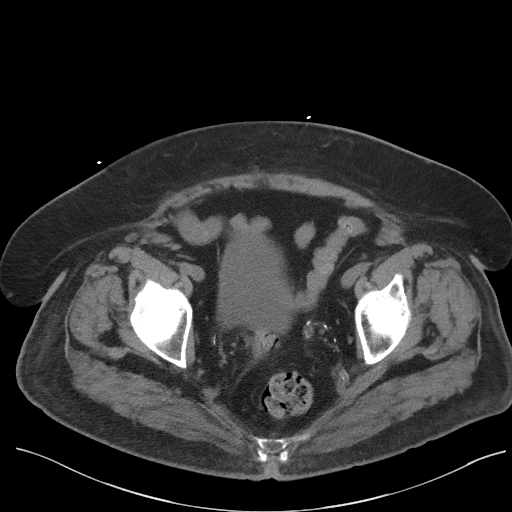
[im 30/88  soft-tissue]
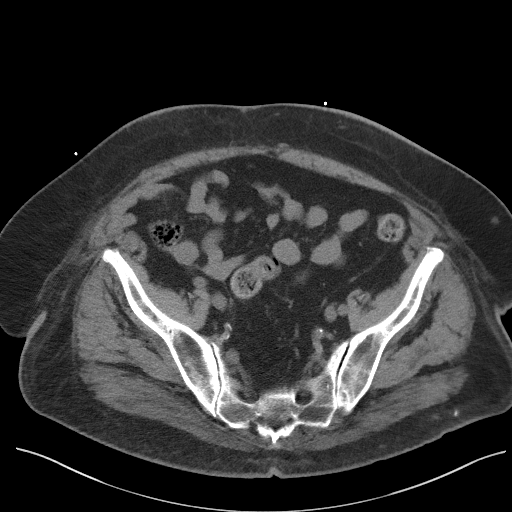
[im 37/88  soft-tissue]
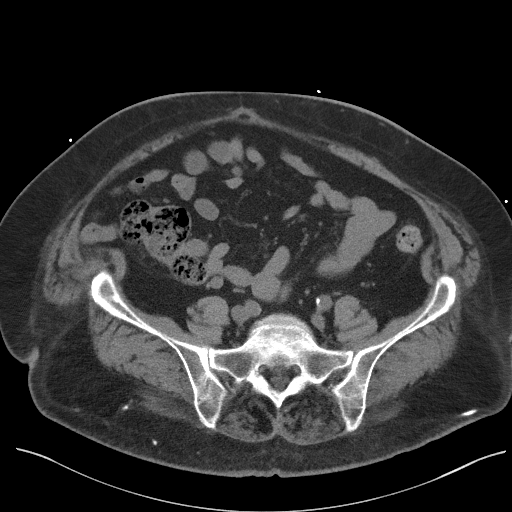
[im 40/88  soft-tissue]
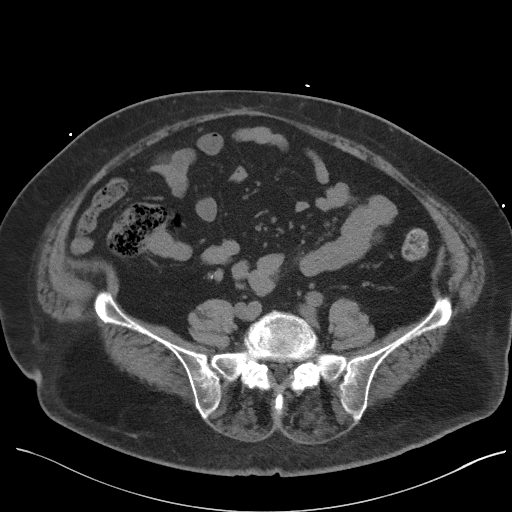
[im 48/88  soft-tissue]
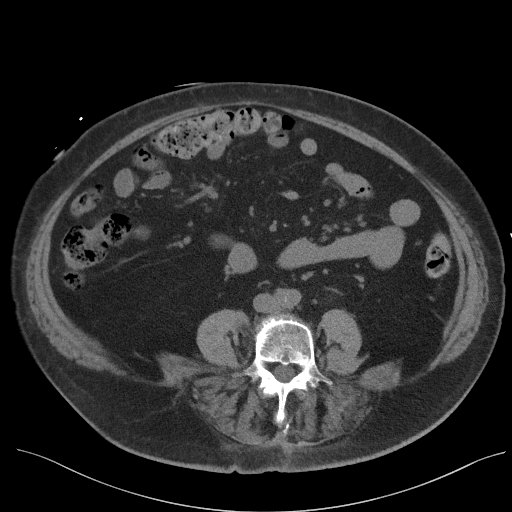
[im 51/88  soft-tissue]
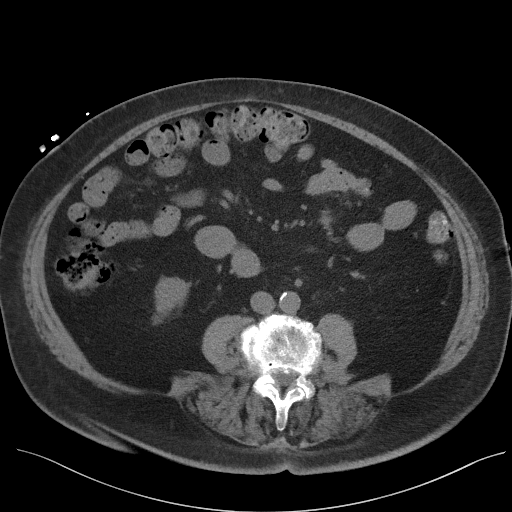
[im 51/88  bone]
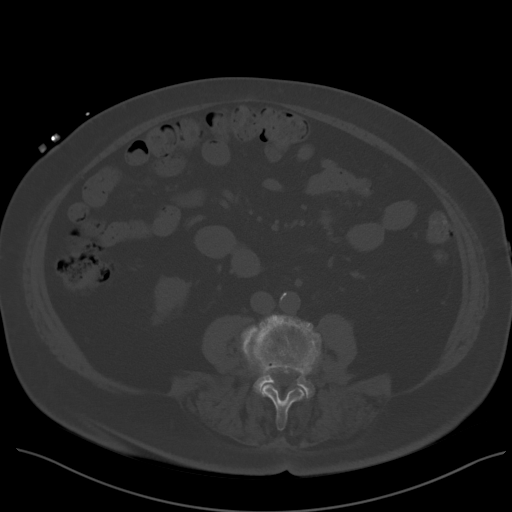
[im 59/88  soft-tissue]
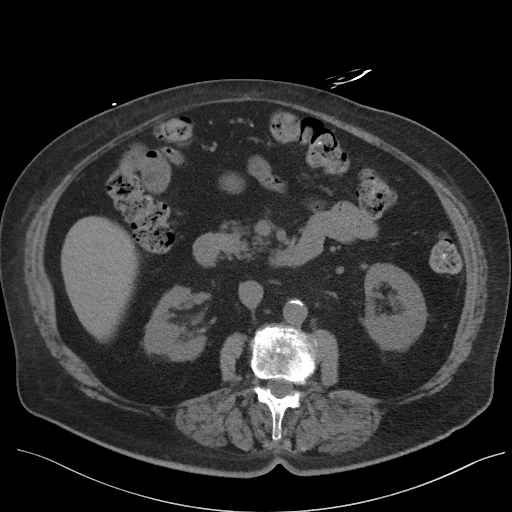
[im 66/88  soft-tissue]
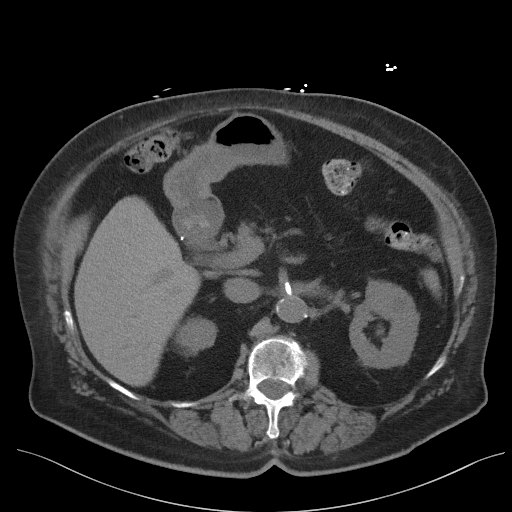
[im 69/88  soft-tissue]
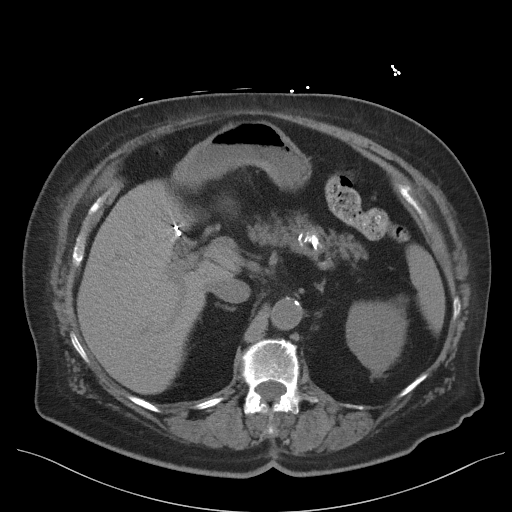
[im 77/88  soft-tissue]
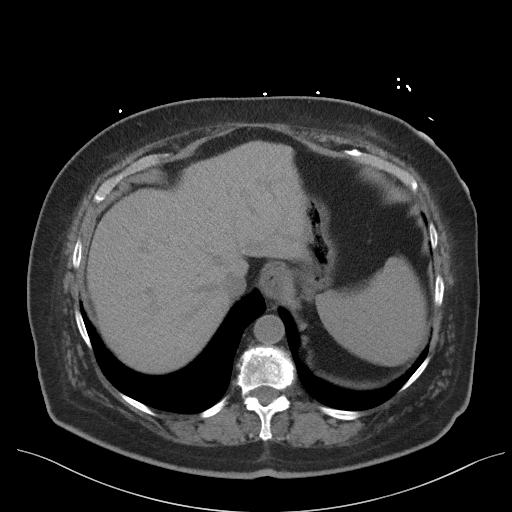
[im 84/88  soft-tissue]
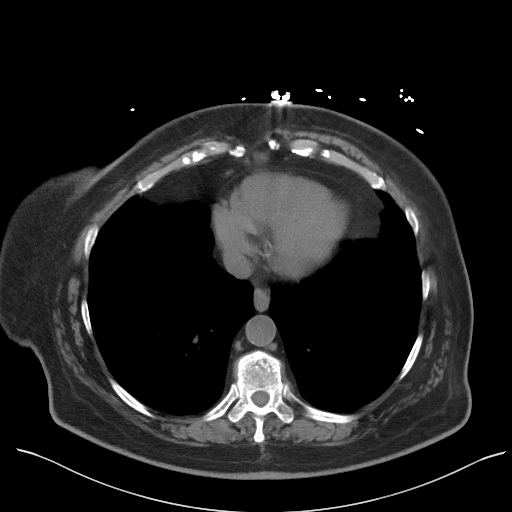

[Series 5: coronal st · coronal · 0.81mm/px · 3 of 109 slices shown]
[im 37/109  soft-tissue]
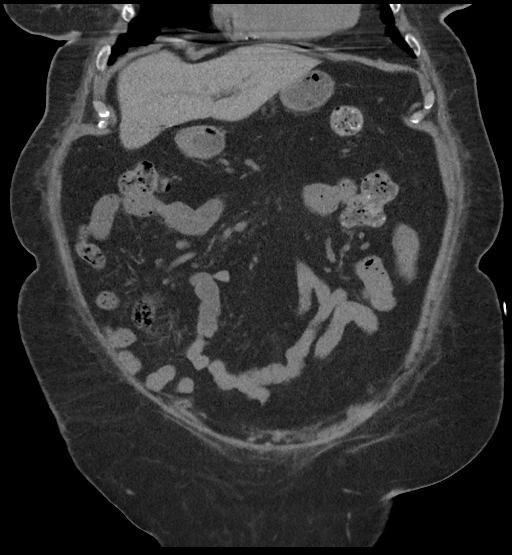
[im 49/109  soft-tissue]
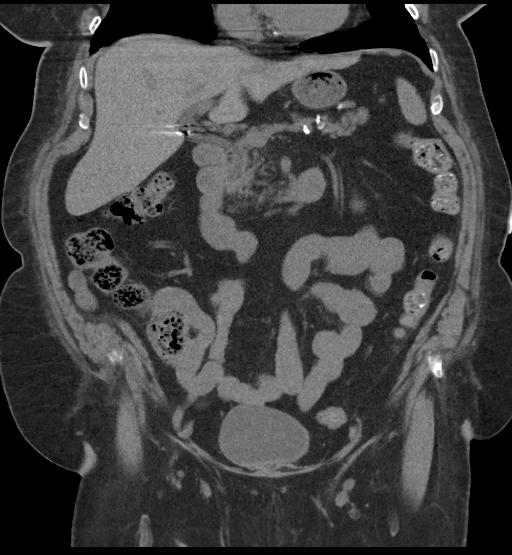
[im 61/109  soft-tissue]
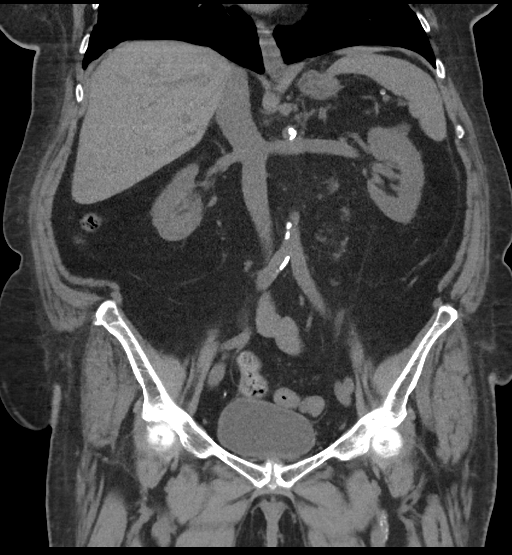

[17 of 46 positions shown; findings below may reference images not displayed]

FINDINGS: Lower chest: No acute abnormality. Coronary calcifications are
noted.

Hepatobiliary: No focal liver abnormality is seen. Status post
cholecystectomy. No biliary dilatation.

Pancreas: Unremarkable. No pancreatic ductal dilatation or
surrounding inflammatory changes.

Spleen: Normal in size without focal abnormality.

Adrenals/Urinary Tract: Adrenal glands are unremarkable. No renal
calculi are identified. Hypodensity is noted in the left kidney
likely related to cyst. No obstructive changes are seen. The bladder
is well distended.

Stomach/Bowel: The appendix is been surgically removed. No
inflammatory or obstructive changes in the bowel are seen.

Vascular/Lymphatic: Aortic atherosclerosis. No enlarged abdominal or
pelvic lymph nodes.

Reproductive: Status post hysterectomy. No adnexal masses.

Other: No abdominal wall hernia or abnormality. No abdominopelvic
ascites.

Musculoskeletal: No acute or significant osseous findings.
IMPRESSION: Left renal hypodensity likely representing a cyst. It is slightly
more prominent than that seen on the prior exam.

Postsurgical changes.

No acute abnormality noted.

## 2019-05-24 IMAGING — DX DG CHEST 2V
2 series · 2 of 2 positions shown · non-contrast
Comparison: 05/12/2017

CLINICAL DATA: Left-sided flank pain and an back pain, initial
encounter

EXAM:
CHEST  2 VIEW

[chest lat]
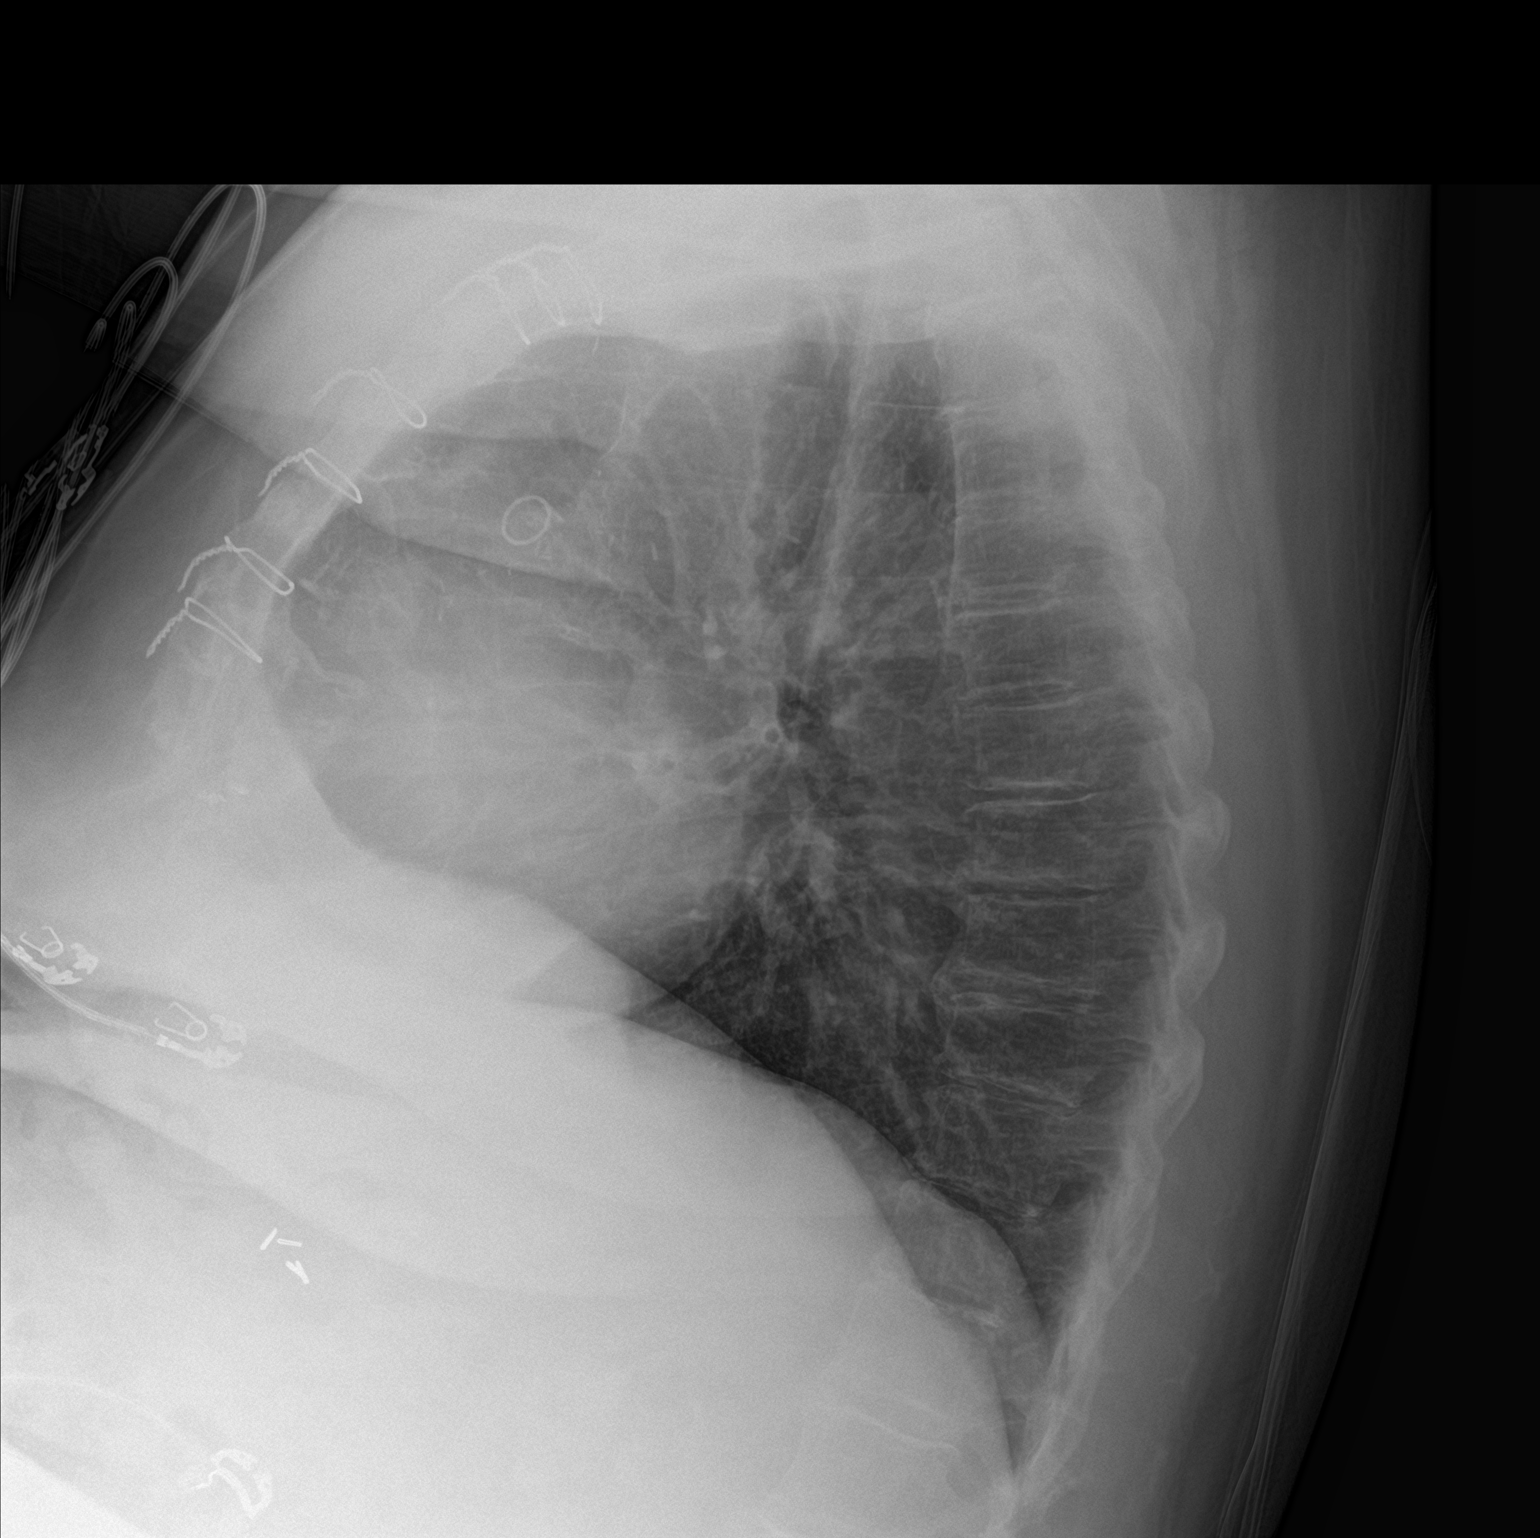

[chest ap]
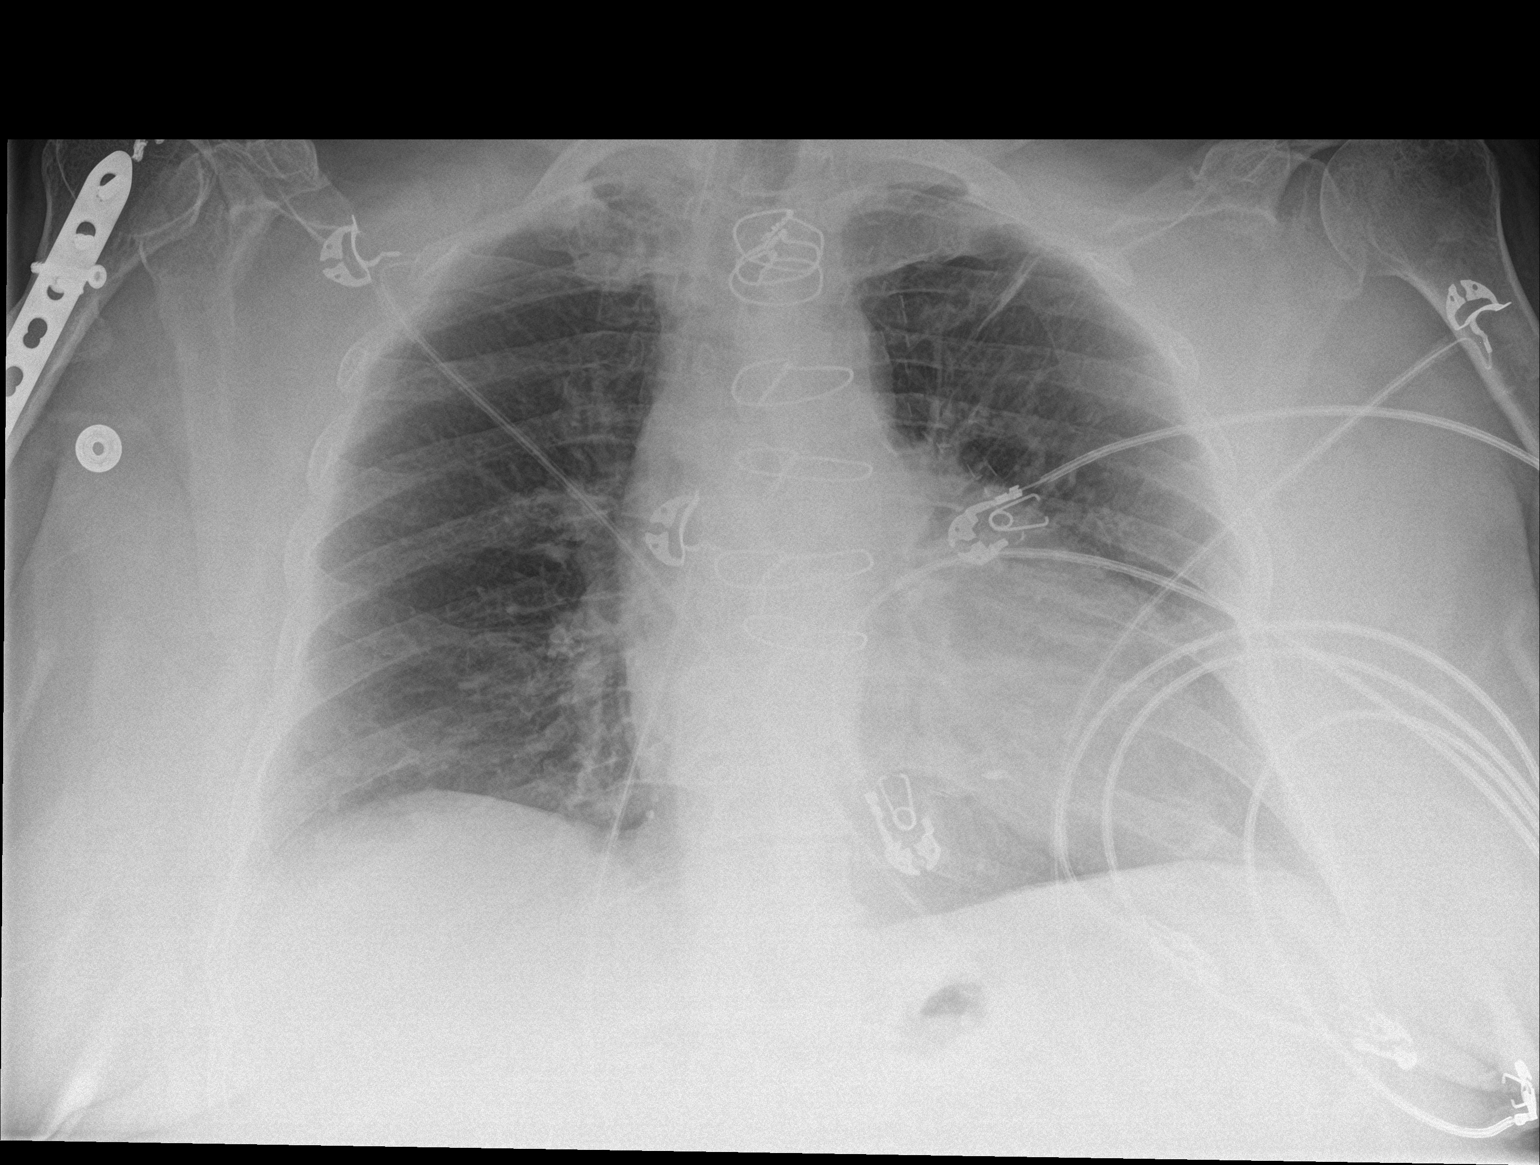

[2 of 2 positions shown; findings below may reference images not displayed]

FINDINGS: Cardiac shadow is stable. Postsurgical changes are seen to the lungs
are clear. No acute bony abnormality is noted. Postsurgical changes
in the right humerus are noted.
IMPRESSION: No active cardiopulmonary disease.

## 2019-10-03 IMAGING — DX DG CHEST 2V
2 series · 2 of 2 positions shown · non-contrast
Comparison: 05/19/2017

CLINICAL DATA: Cough x3 days

EXAM:
CHEST  2 VIEW

[chest lat]
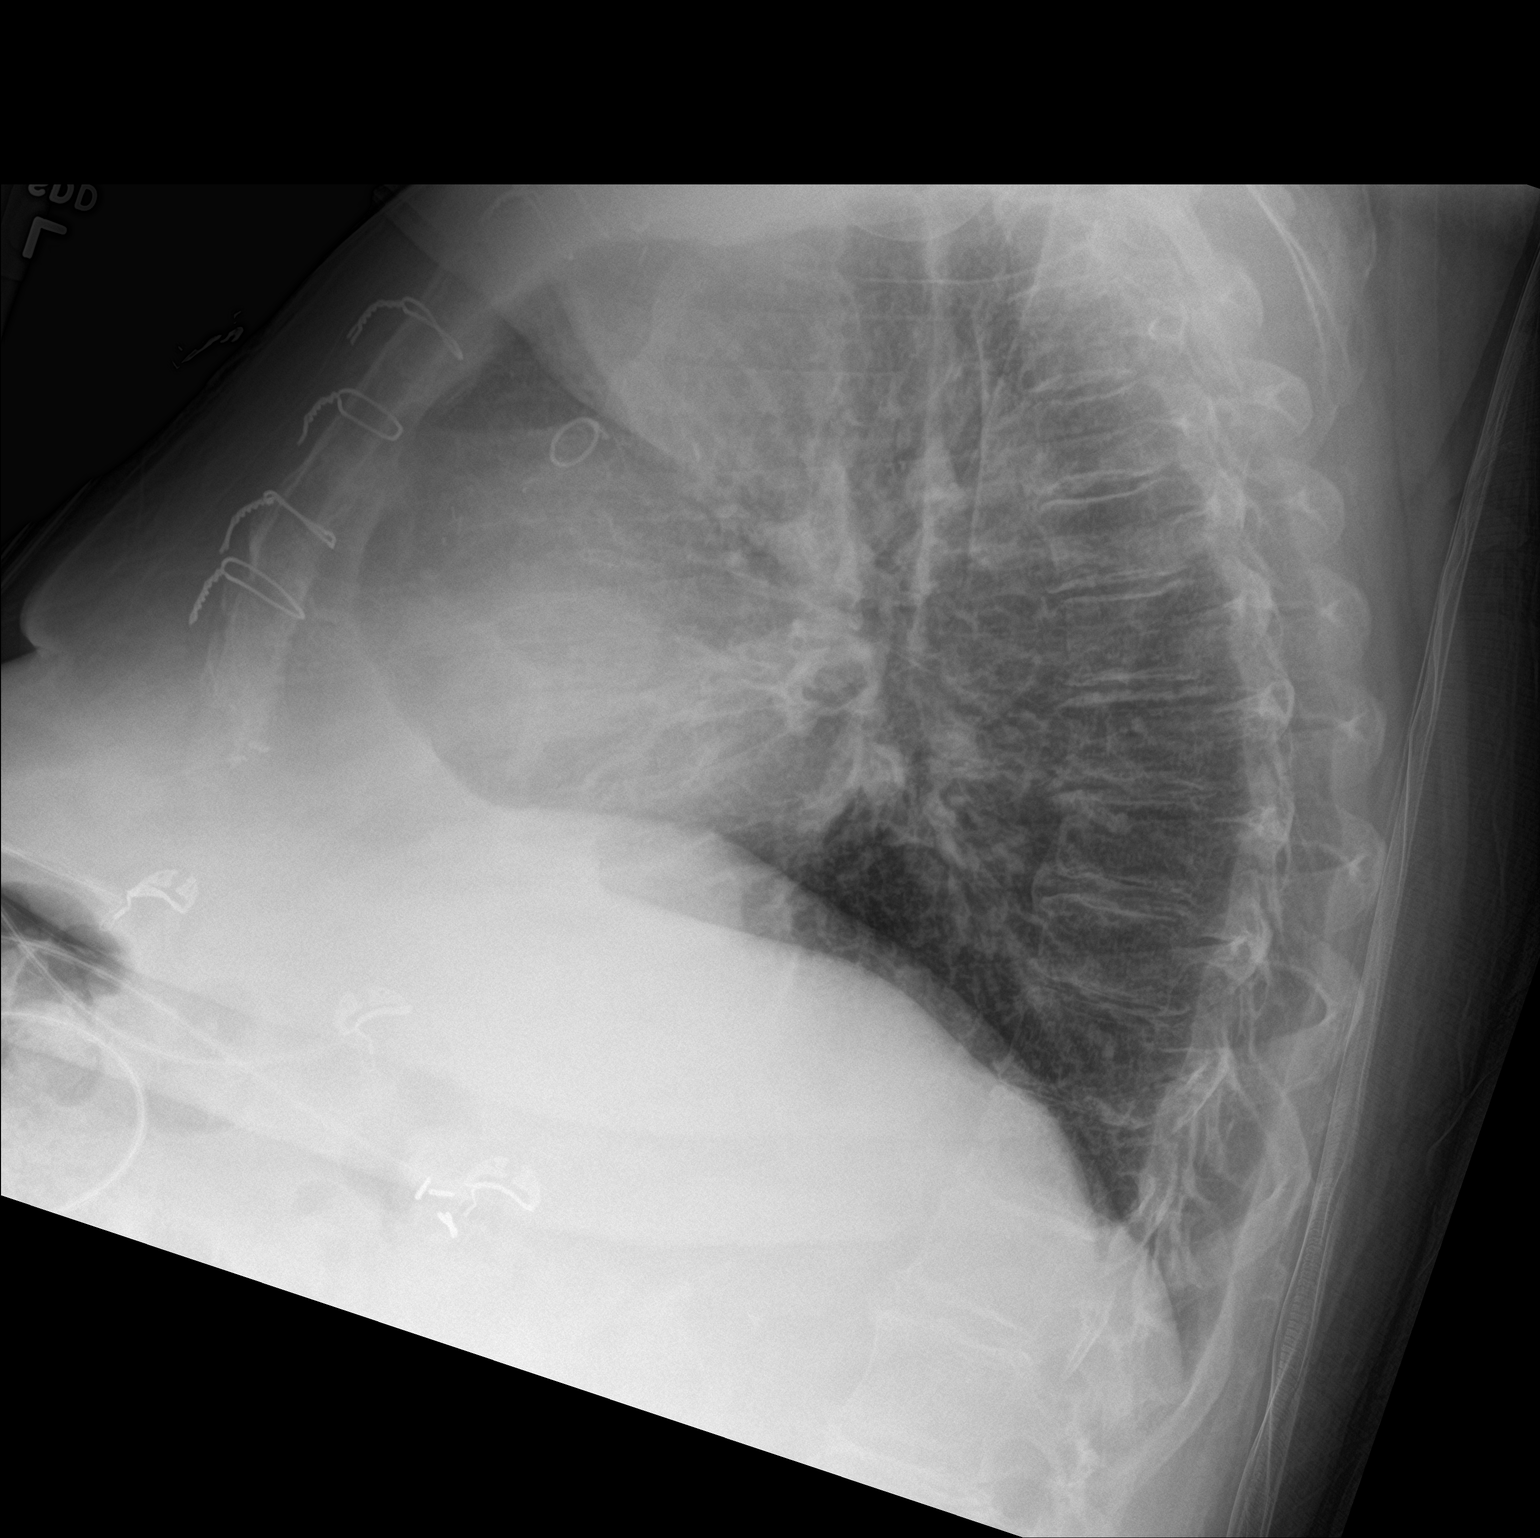

[chest ap]
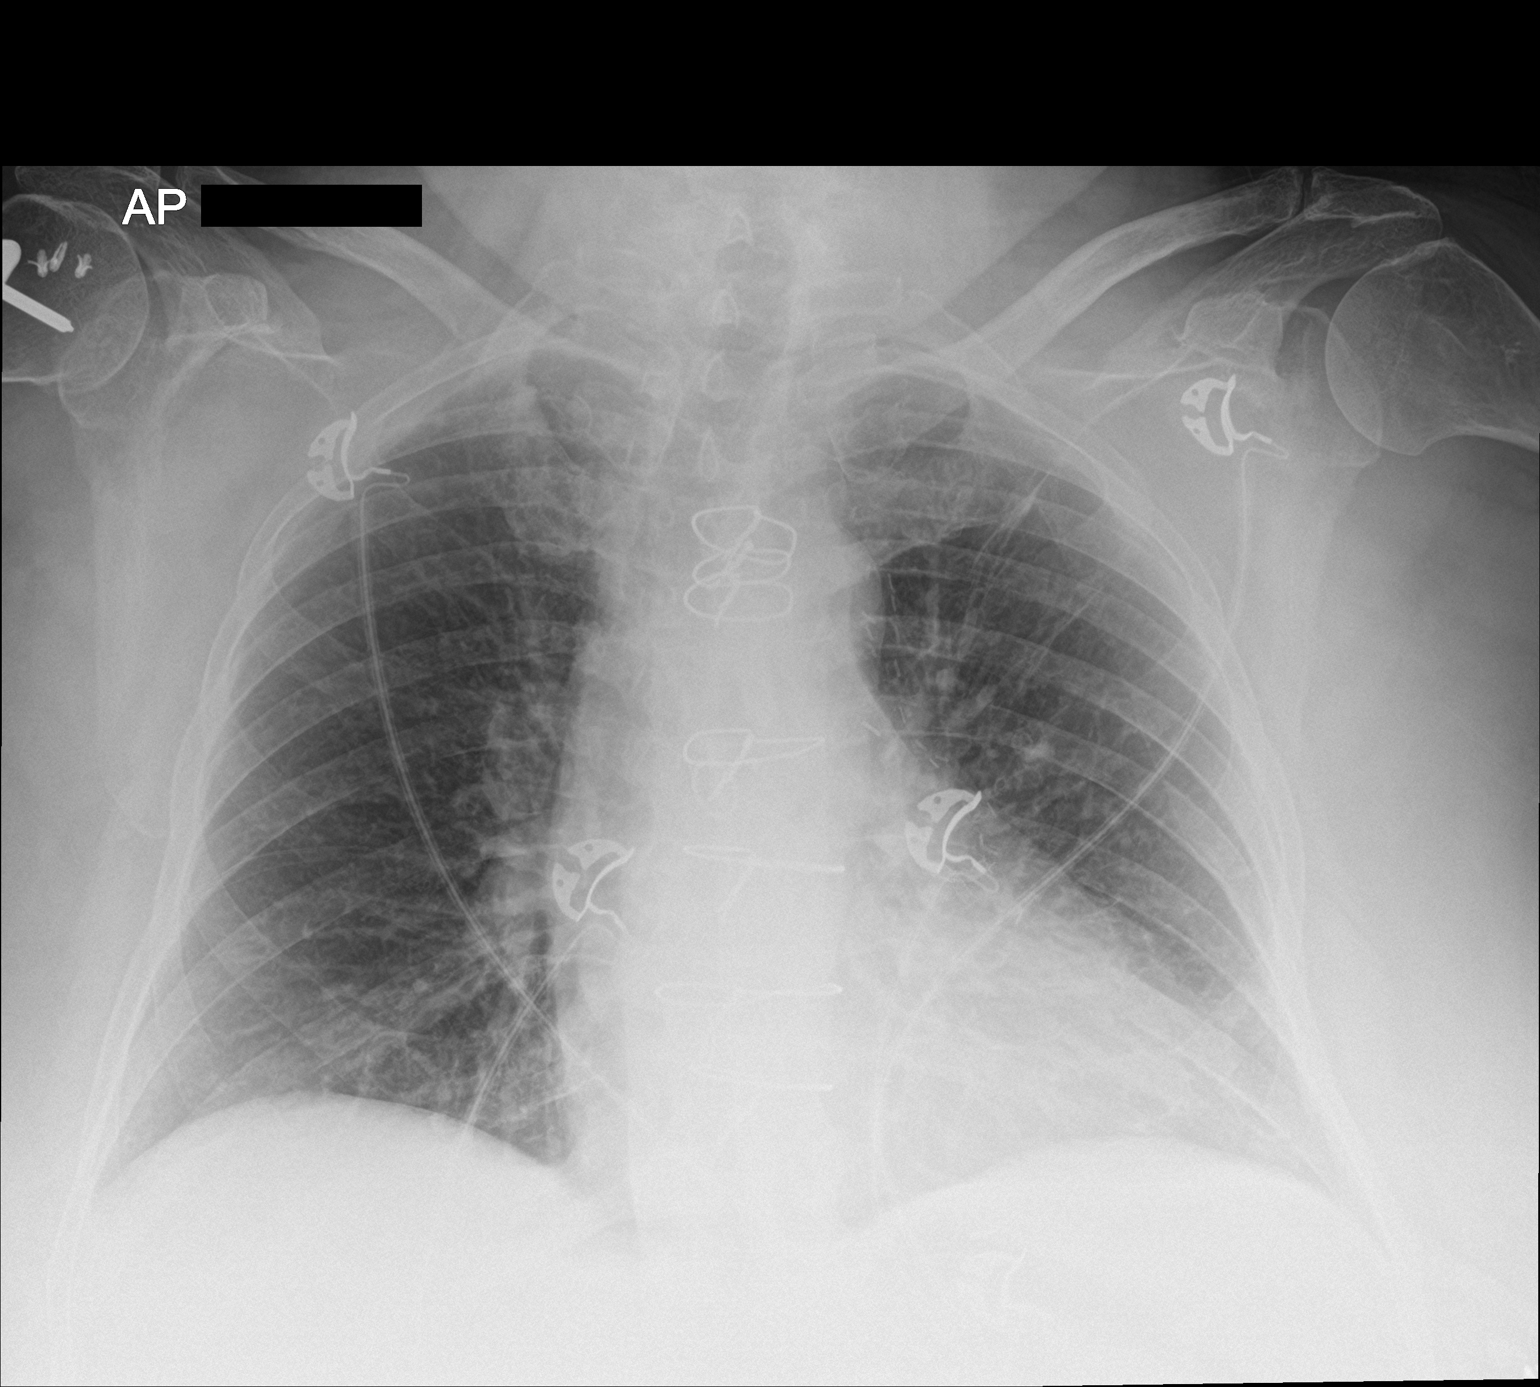

[2 of 2 positions shown; findings below may reference images not displayed]

FINDINGS: Status post CABG with minimal aortic atherosclerosis. Top normal
size cardiac silhouette. No pneumonic consolidation nor overt
pulmonary edema. Stable pulmonary scarring in the left upper lobe.
No acute nor suspicious osseous abnormalities. Degenerative changes
are stable along the dorsal spine.
IMPRESSION: Status post CABG. Left upper lobe scarring. No pneumonic
consolidation or effusion.

## 2020-02-22 IMAGING — US US CAROTID DUPLEX BILAT
1 series · 13 of 24 positions shown · non-contrast
Comparison: 08/02/2016; 03/19/2016

CLINICAL DATA: Syncopal episode and collapse. History of stroke,
hypertension, CAD (post myocardial infarction and CABG) and
diabetes.

EXAM:
BILATERAL CAROTID DUPLEX ULTRASOUND
TECHNIQUE: Gray scale imaging, color Doppler and duplex ultrasound were
performed of bilateral carotid and vertebral arteries in the neck.

[Series 1: us carotid duplex bilat · 0.06mm/px · 13 of 81 slices shown]
[im 1/81]
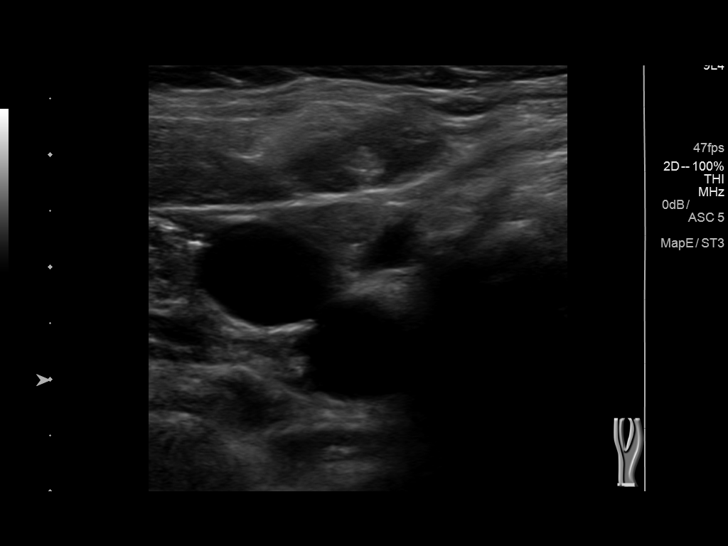
[im 7/81]
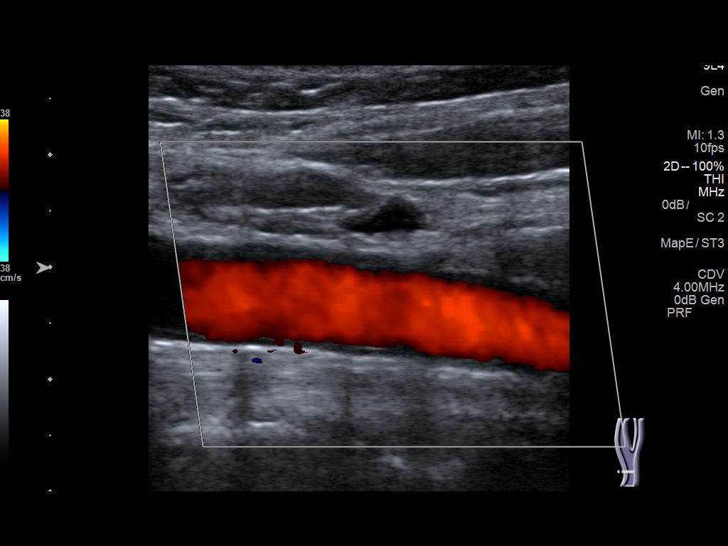
[im 14/81]
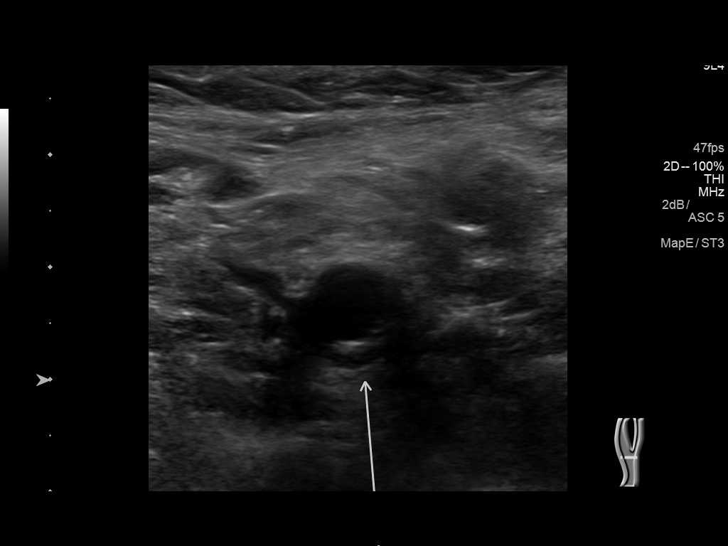
[im 21/81]
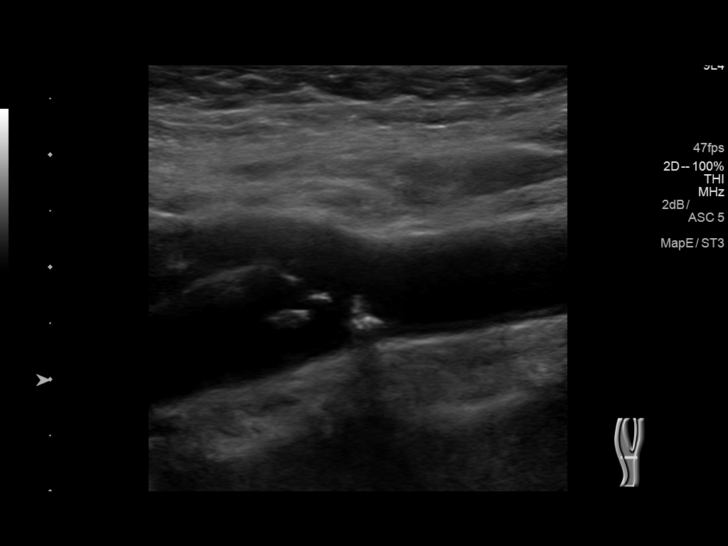
[im 28/81]
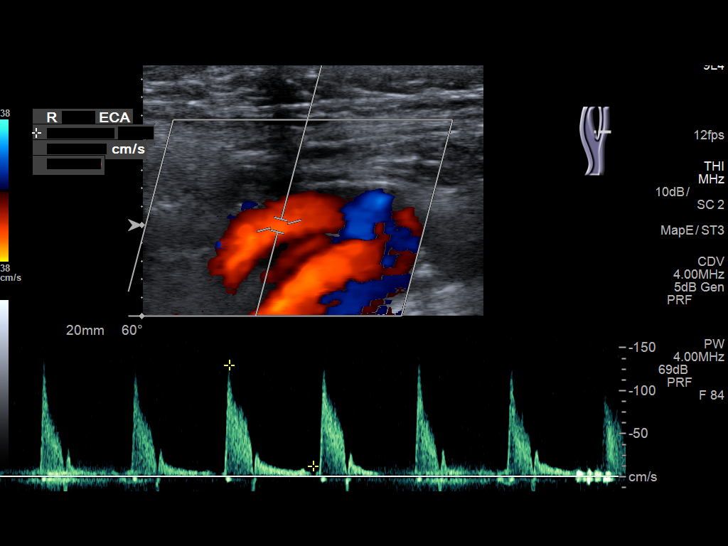
[im 35/81]
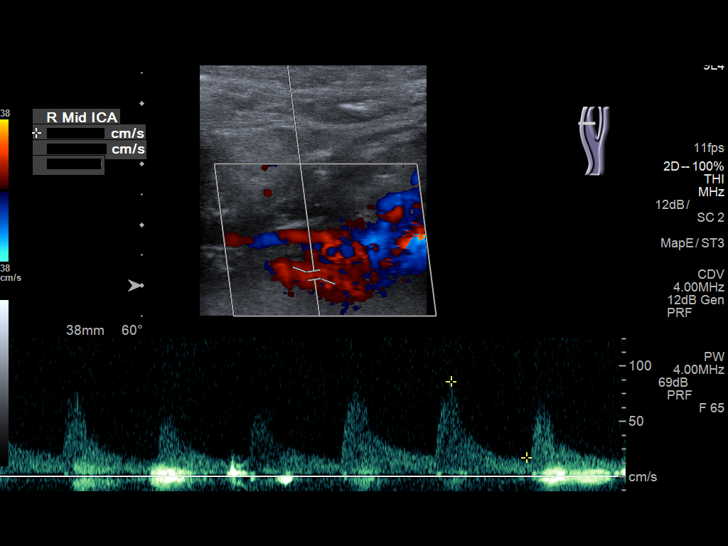
[im 42/81]
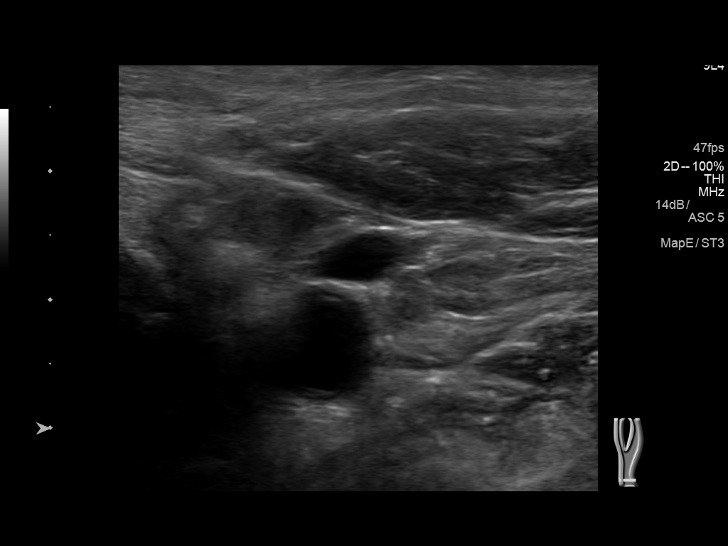
[im 46/81]
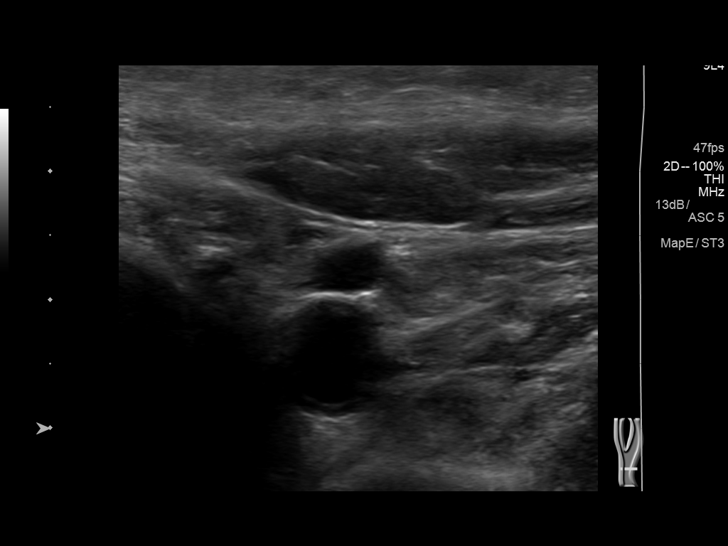
[im 53/81]
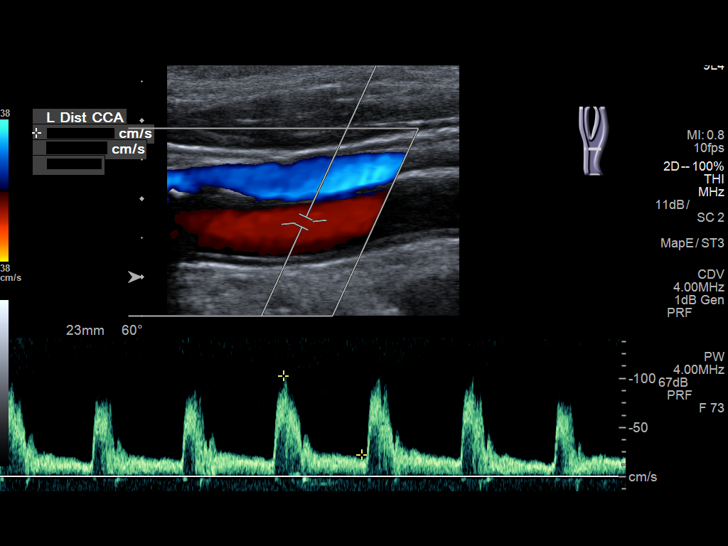
[im 60/81]
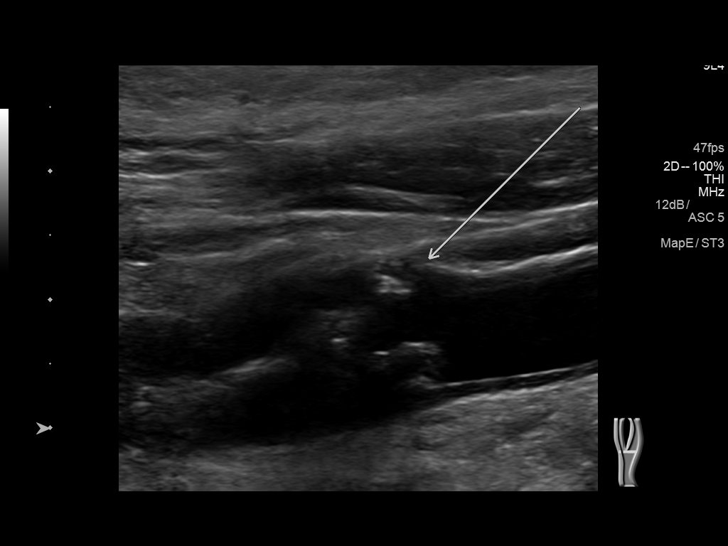
[im 67/81]
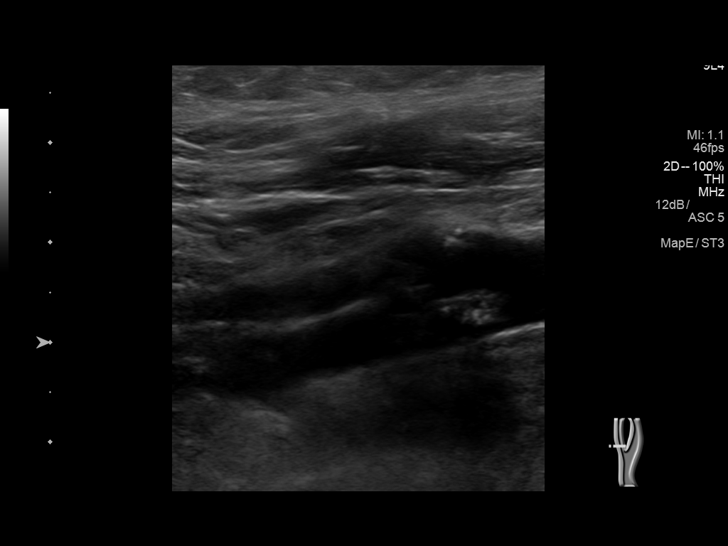
[im 74/81]
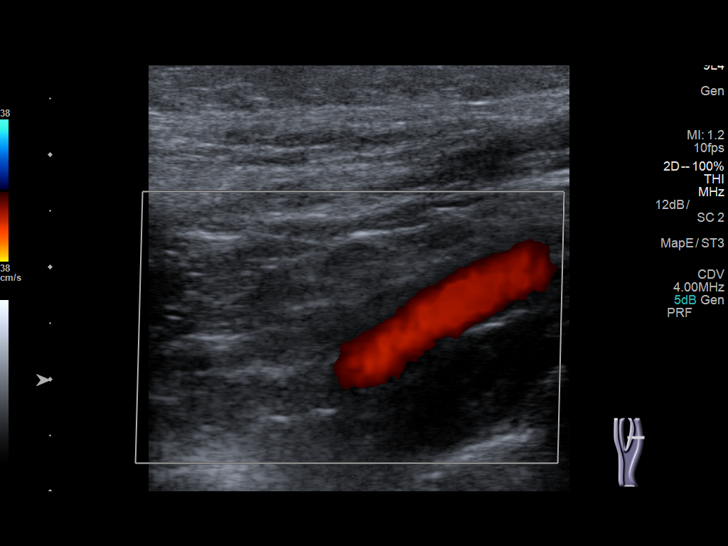
[im 81/81]
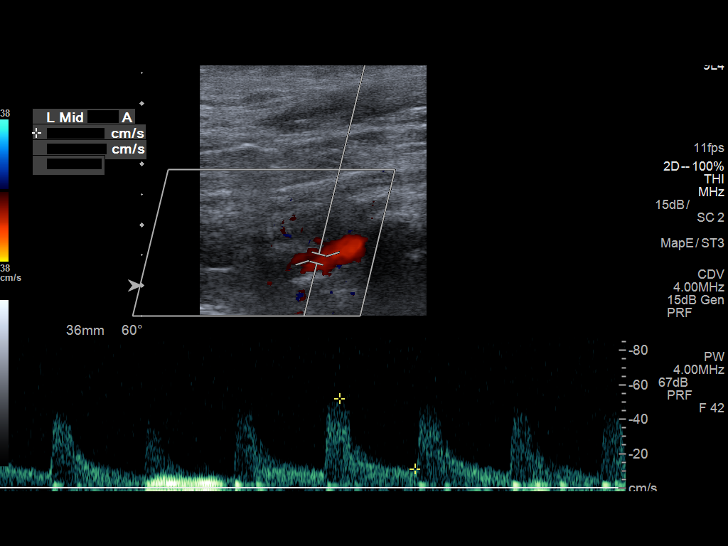

[13 of 24 positions shown; findings below may reference images not displayed]

FINDINGS: Examination is degraded secondary to patient body habitus.

Criteria: Quantification of carotid stenosis is based on velocity
parameters that correlate the residual internal carotid diameter
with NASCET-based stenosis levels, using the diameter of the distal
internal carotid lumen as the denominator for stenosis measurement.

The following velocity measurements were obtained:

RIGHT

ICA:  91/15 cm/sec

CCA:  95/15 cm/sec

SYSTOLIC ICA/CCA RATIO:

ECA:  130 cm/sec

LEFT

ICA:  120/30 cm/sec

CCA:  41/21 cm/sec

SYSTOLIC ICA/CCA RATIO:

ECA:  143 cm/sec

RIGHT CAROTID ARTERY: There is a moderate amount of eccentric mixed
echogenic plaque within the right carotid bulb (image 17 and 19),
extending to involve the origin and proximal aspects the right
internal carotid artery (image 30), morphologically progressed
compared to the [DATE] examination though again not definitely
resulting in elevated peak systolic velocities within the
interrogated course the right internal carotid artery to suggest a
hemodynamically significant stenosis.

RIGHT VERTEBRAL ARTERY:  Antegrade flow

LEFT CAROTID ARTERY: There is a moderate to large amount eccentric
echogenic atherosclerotic plaque within the left carotid bulb (image
58), progressed compared to the [DATE] examination, and while not
definitely resulting elevated peak systolic velocities within
pterygoid course the left internal carotid artery, the degree of
stenosis is estimated to be at the 50-69% luminal narrowing range.

LEFT VERTEBRAL ARTERY:  Antegrade flow
IMPRESSION: 1. Body habitus degraded examination demonstrates interval
progression of now moderate to large amount of left-sided
atherosclerotic plaque morphologically resulting in a
hemodynamically significant stenosis estimated to be within the
50-69% luminal narrowing range. Further evaluation with CTA could
performed as clinically indicated.
2. Moderate amount of right-sided atherosclerotic plaque, not
definitely resulting in a hemodynamically significant stenosis.

## 2020-03-04 IMAGING — DX DG CHEST 2V
2 series · 2 of 2 positions shown · non-contrast
Comparison: 09/28/2017 chest radiograph.

CLINICAL DATA: Dyspnea and wheezing

EXAM:
CHEST - 2 VIEW

[chest lat]
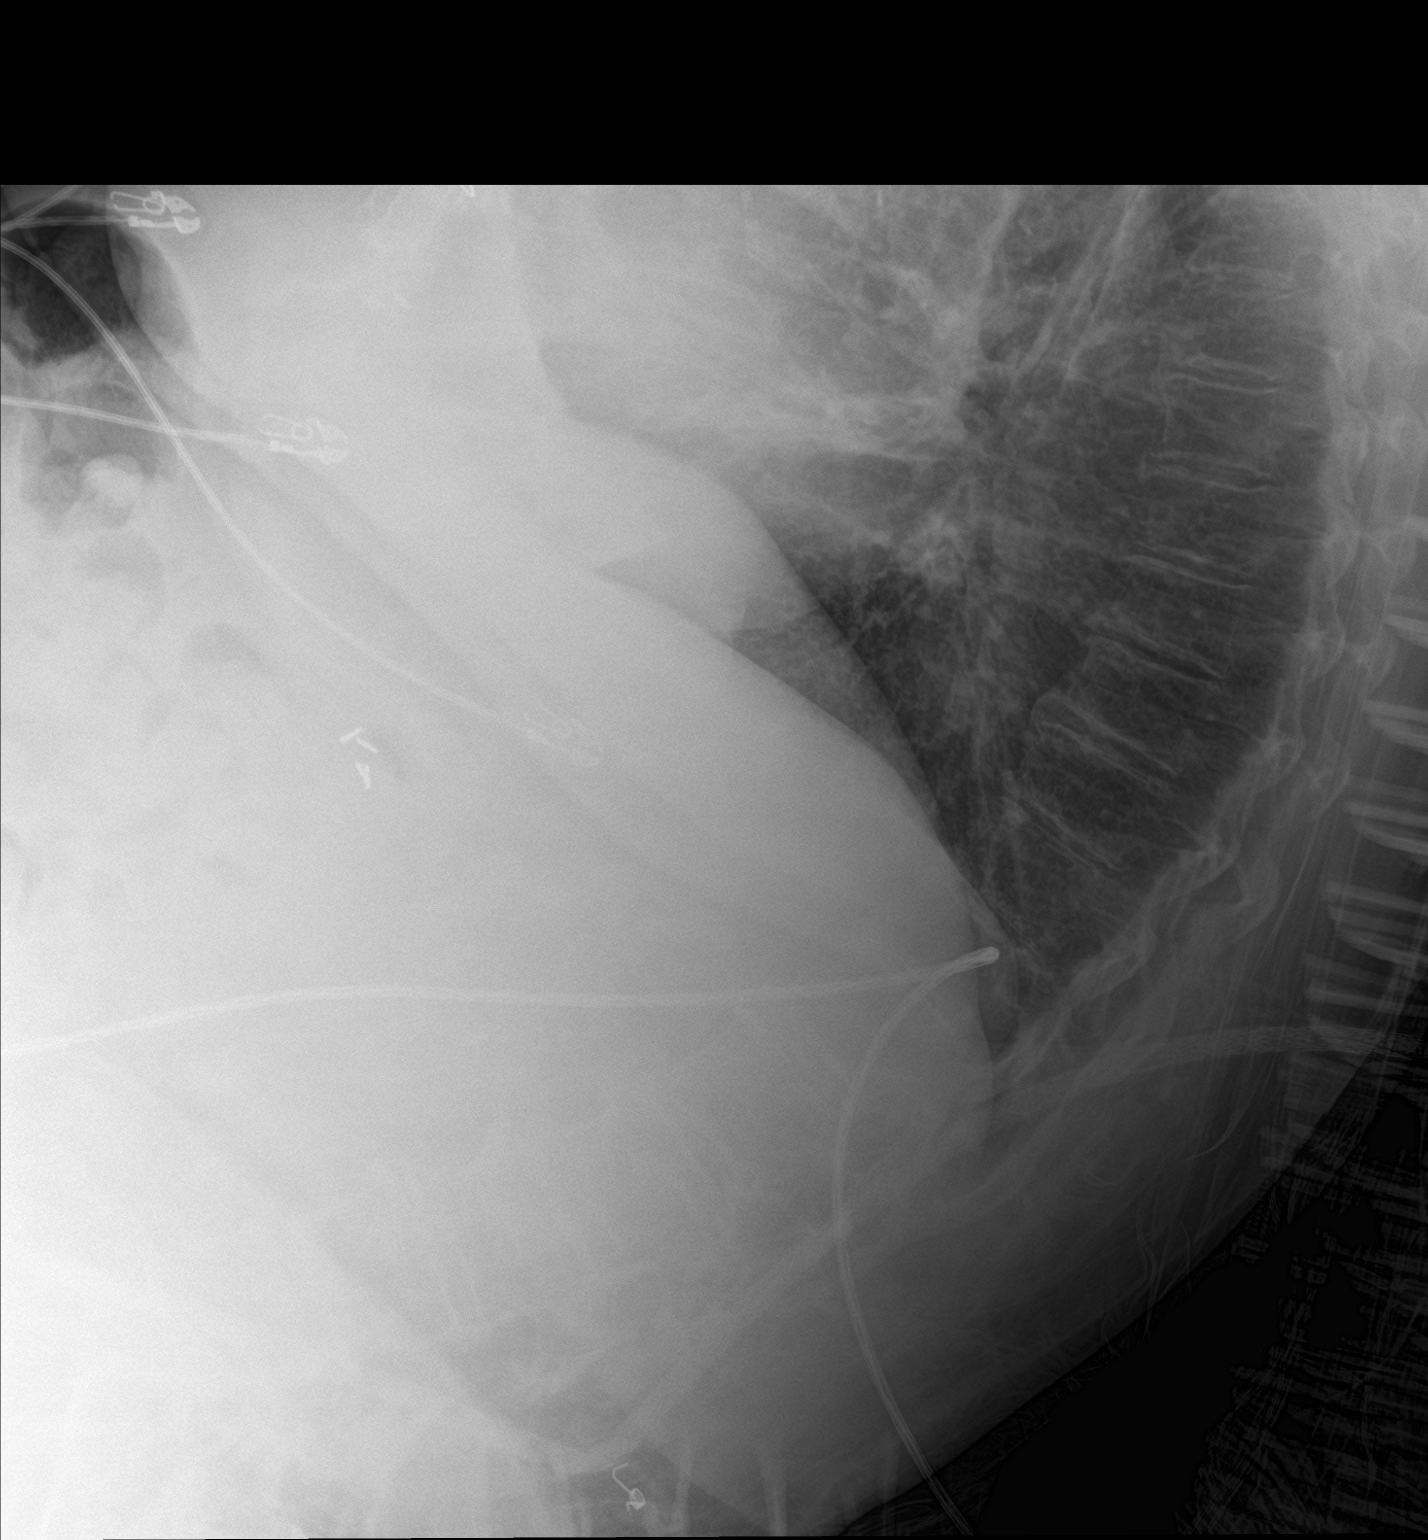

[chest ap]
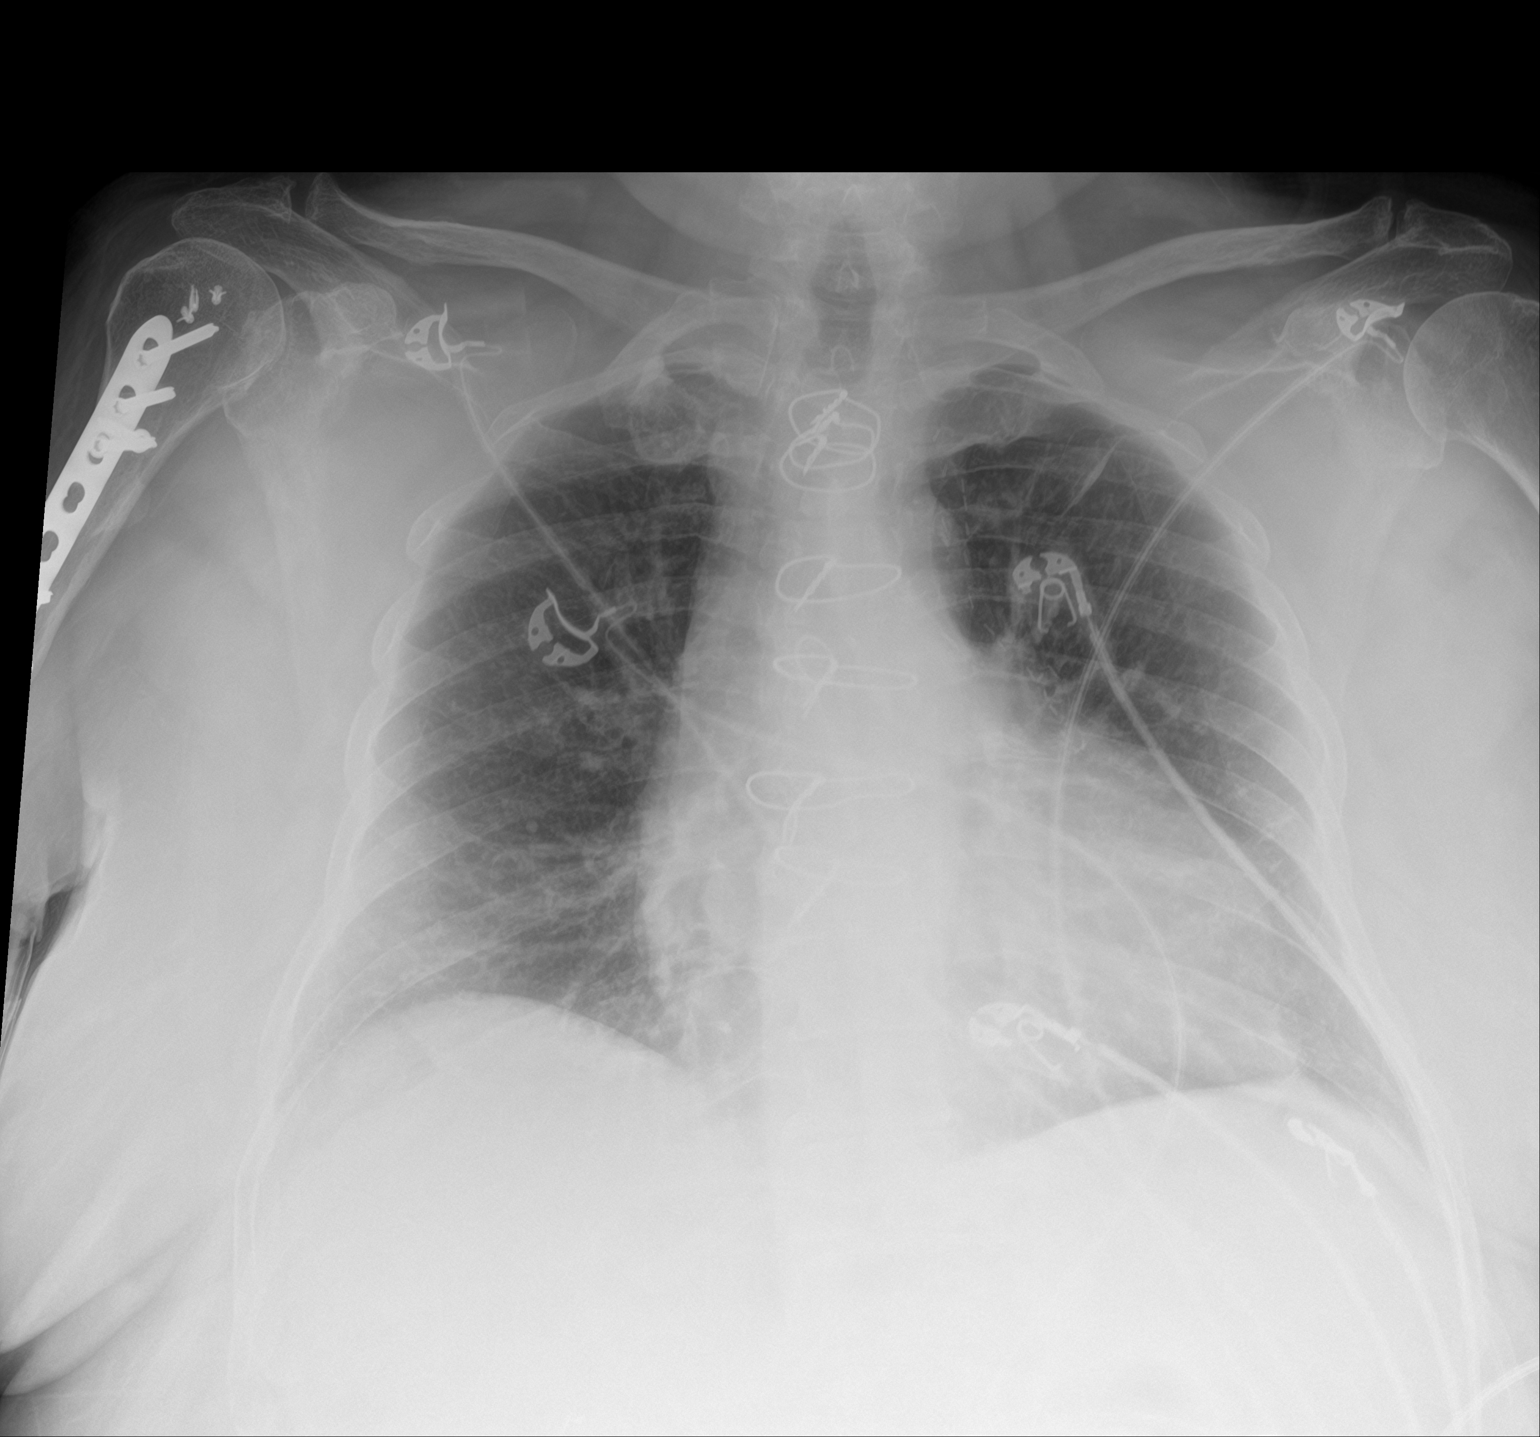

[2 of 2 positions shown; findings below may reference images not displayed]

FINDINGS: Partially visualized surgical hardware in the proximal right
humerus. Intact sternotomy wires. CABG clips overlie the
mediastinum. Stable cardiomediastinal silhouette with top-normal
heart size. No pneumothorax. No pleural effusion. Stable mild
scarring at the left lung apex. No pulmonary edema. No acute
consolidative airspace disease.
IMPRESSION: No active cardiopulmonary disease.

## 2020-06-14 IMAGING — DX DG CHEST 2V
2 series · 2 of 2 positions shown · non-contrast
Comparison: Radiographs February 28, 2018.

CLINICAL DATA: Weakness.

EXAM:
CHEST - 2 VIEW

[chest pa]
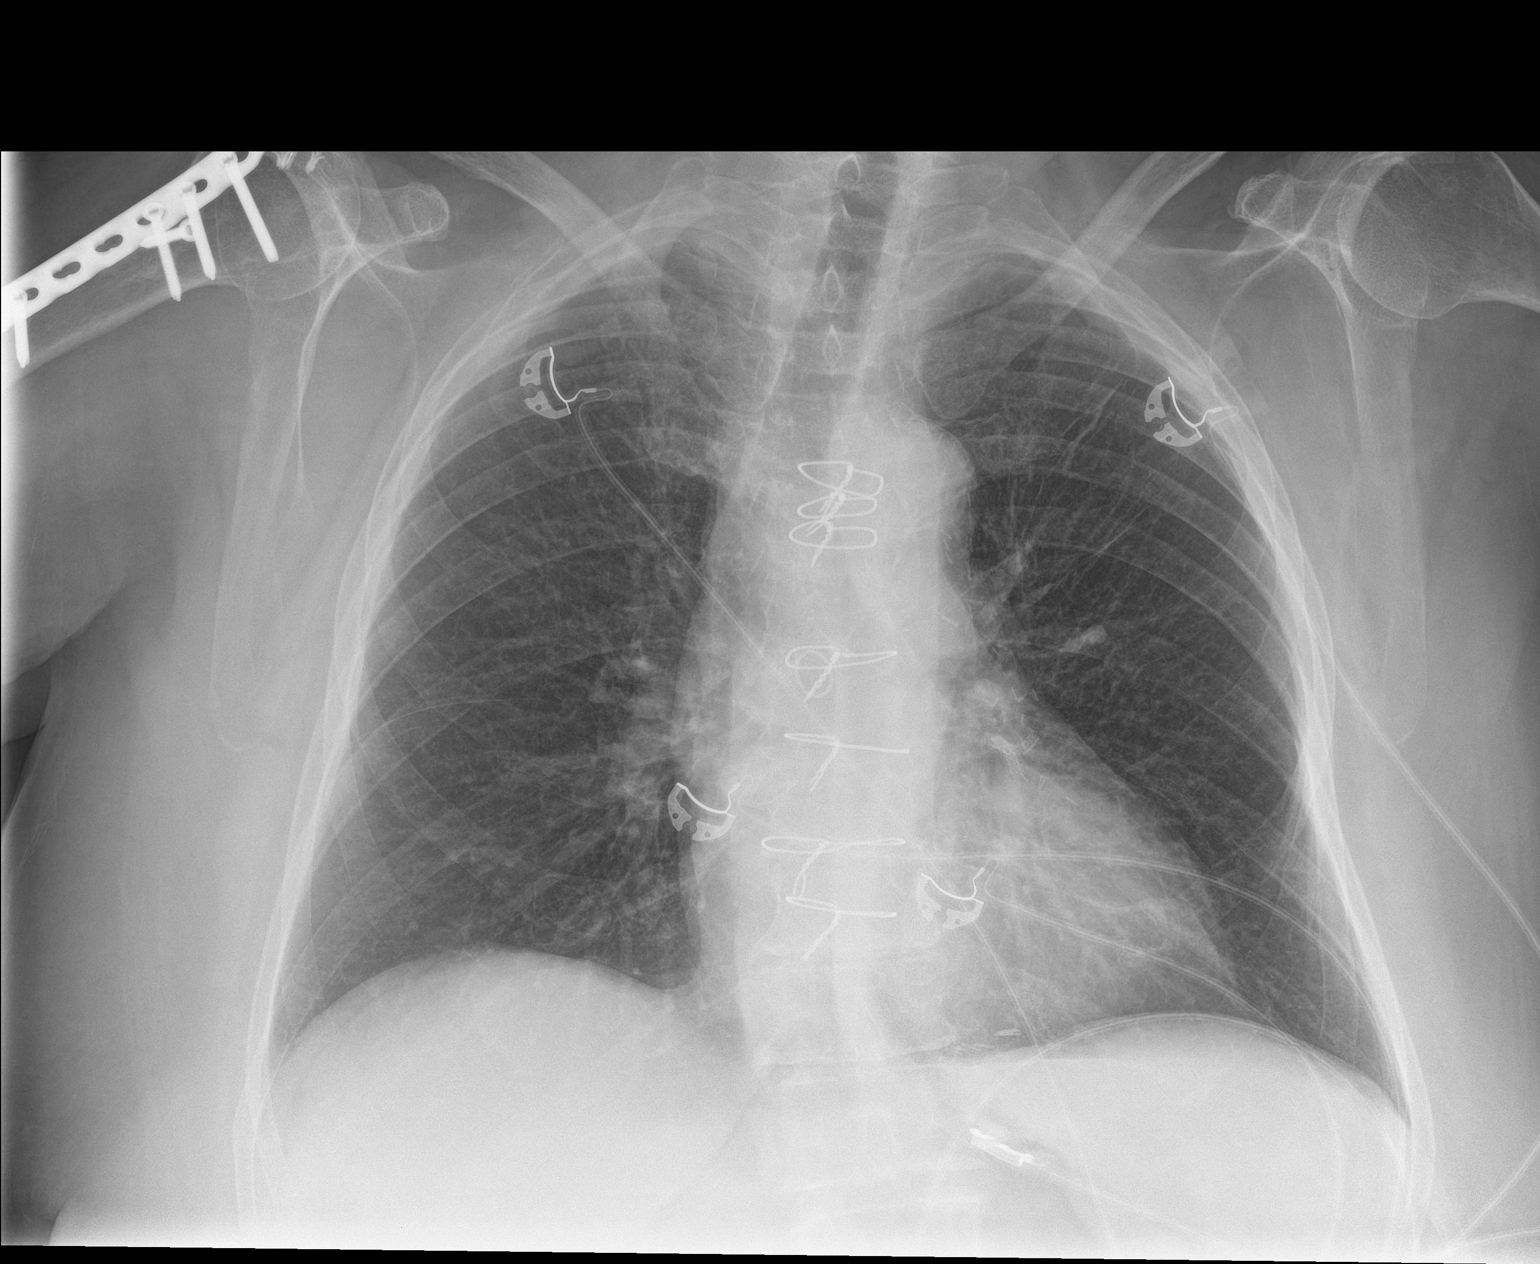

[chest lat]
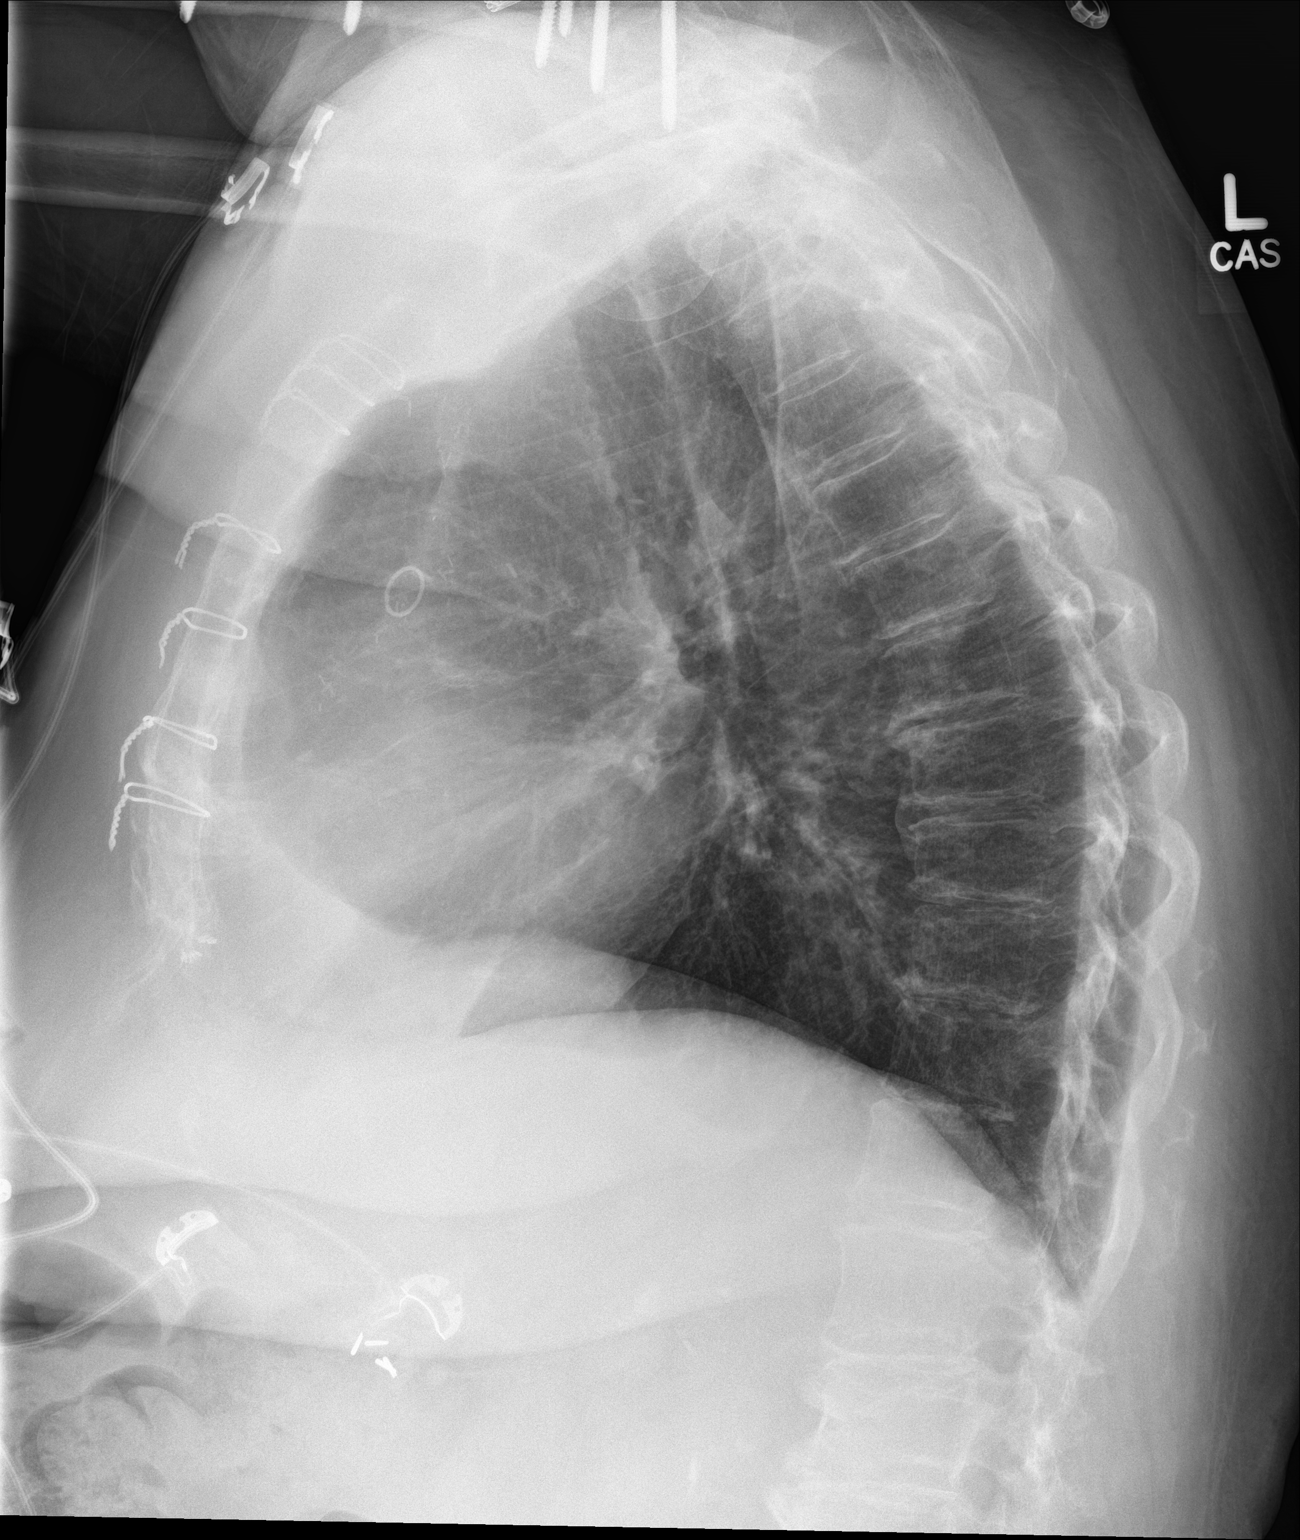

[2 of 2 positions shown; findings below may reference images not displayed]

FINDINGS: The heart size and mediastinal contours are within normal limits.
Status post coronary artery bypass graft. No pneumothorax or pleural
effusion is noted. Both lungs are clear. The visualized skeletal
structures are unremarkable.
IMPRESSION: No active cardiopulmonary disease.

## 2020-06-14 IMAGING — CT CT HEAD W/O CM
3 series · 16 of 47 positions shown, 19 images · non-contrast
Comparison: CT scan of May 10, 2017.

CLINICAL DATA: Altered level of consciousness.

EXAM:
CT HEAD WITHOUT CONTRAST
TECHNIQUE: Contiguous axial images were obtained from the base of the skull
through the vertex without intravenous contrast.

[Series 2: head trauma wo · axial · 0.47mm/px · z∈[+143,+268]mm · 10 of 31 slices shown, 13 images]
[im 3/31  brain]
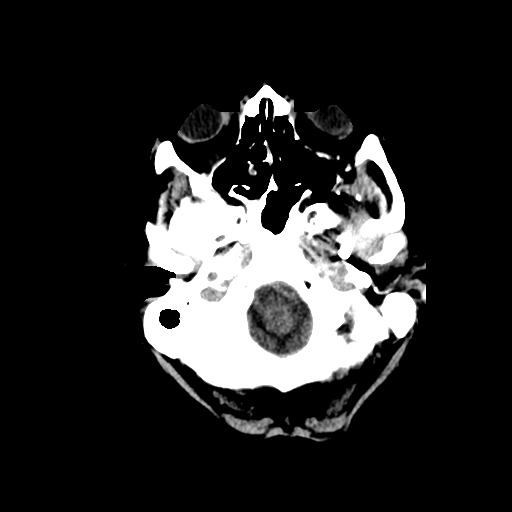
[im 3/31  bone]
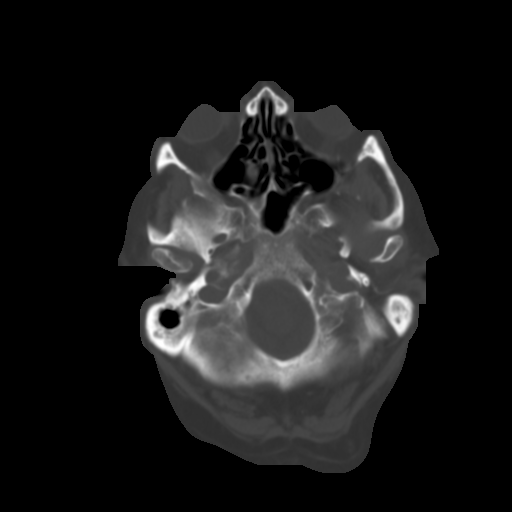
[im 6/31  brain]
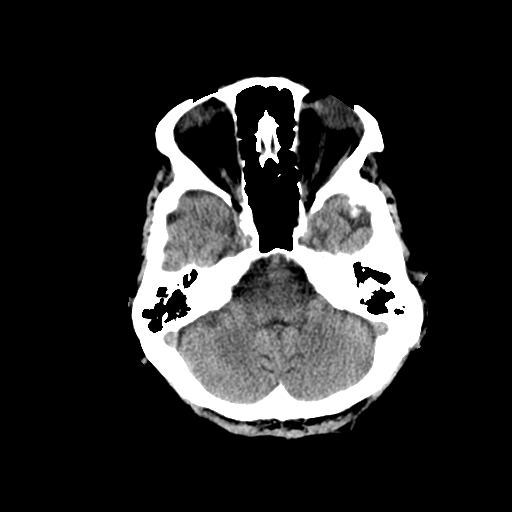
[im 9/31  brain]
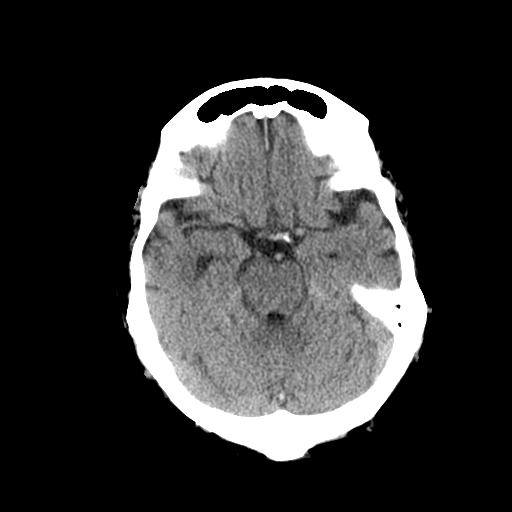
[im 11/31  brain]
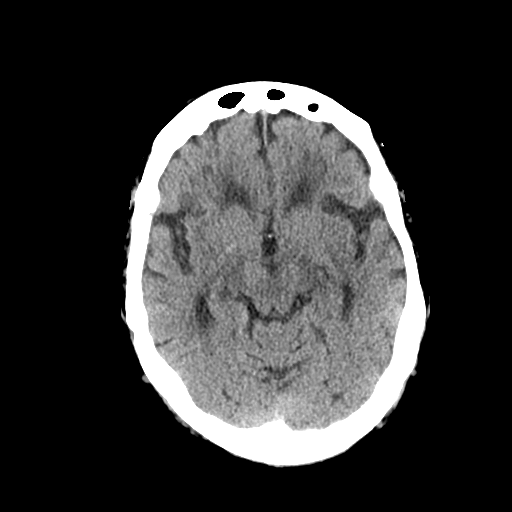
[im 14/31  brain]
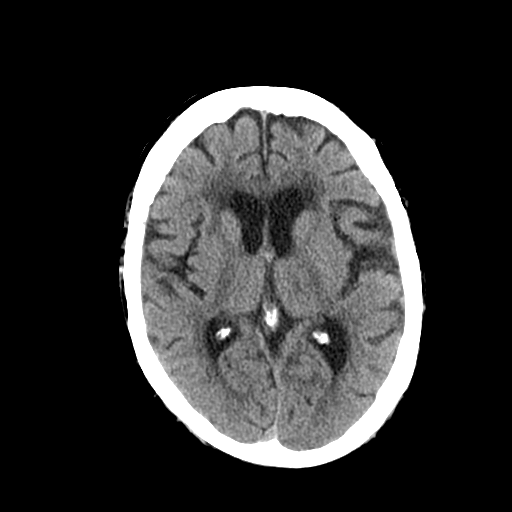
[im 14/31  bone]
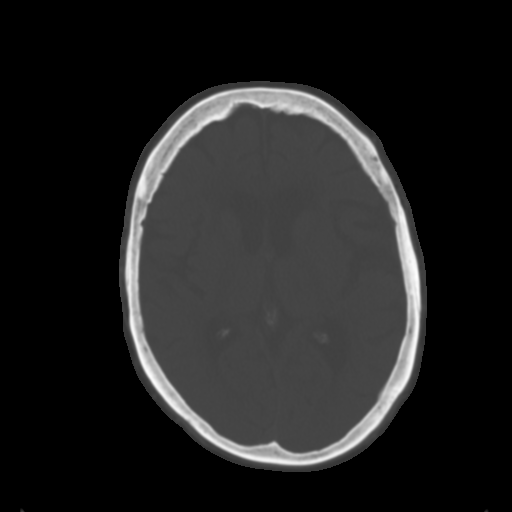
[im 17/31  brain]
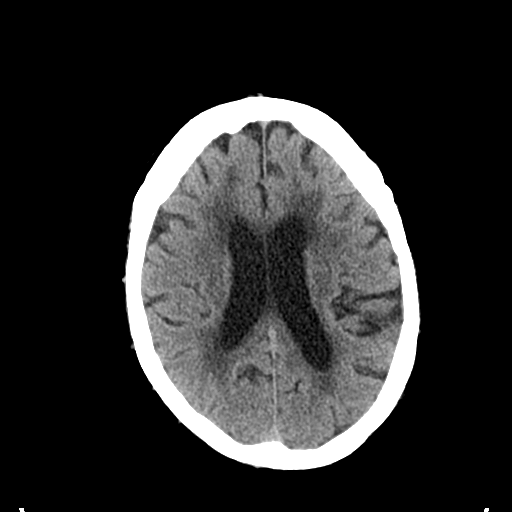
[im 20/31  brain]
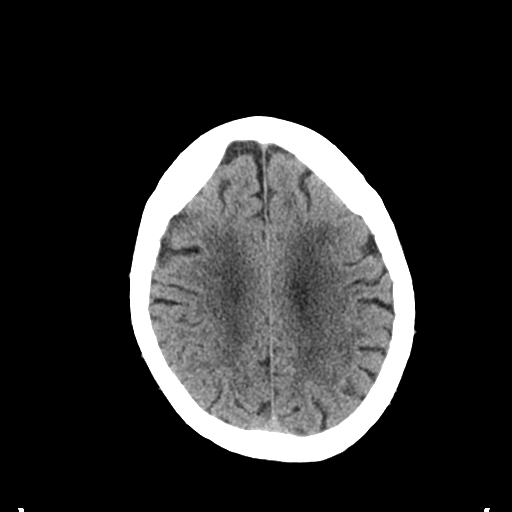
[im 23/31  brain]
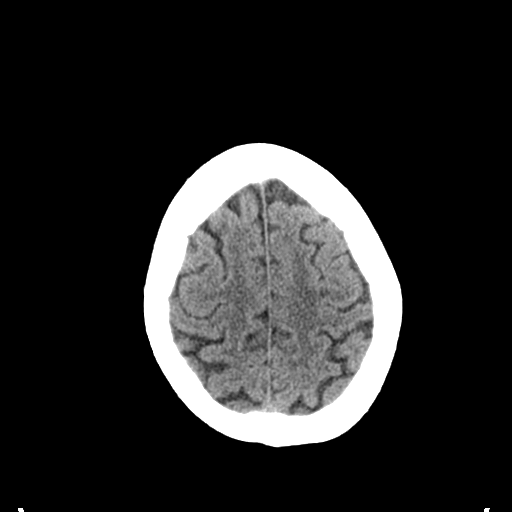
[im 25/31  brain]
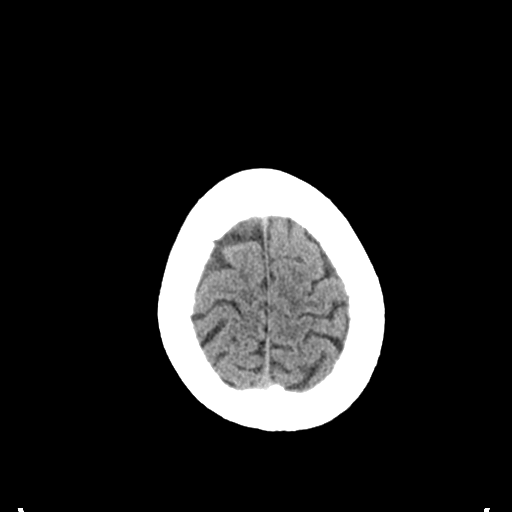
[im 25/31  bone]
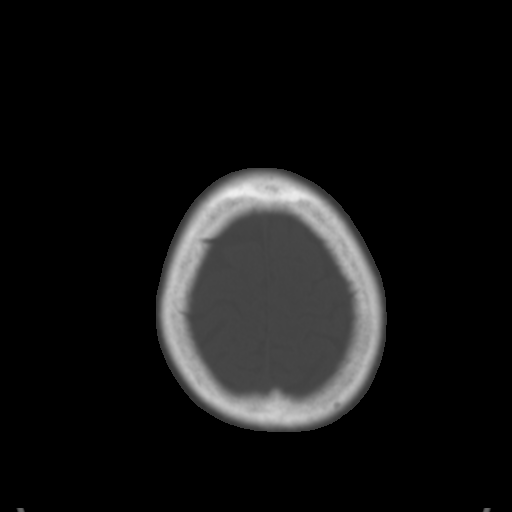
[im 28/31  brain]
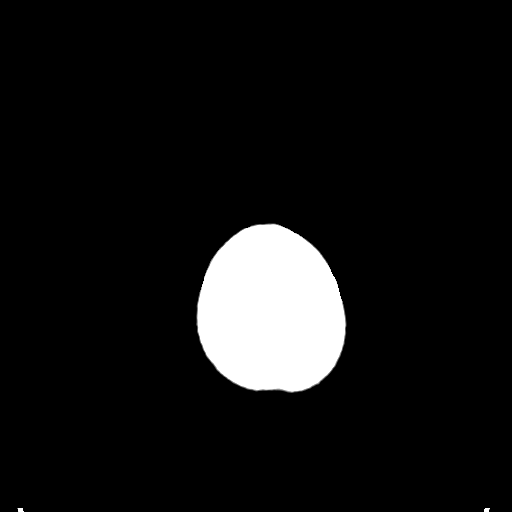

[Series 4: coronal soft tissue · coronal · 0.30mm/px · 3 of 66 slices shown]
[im 23/66  brain]
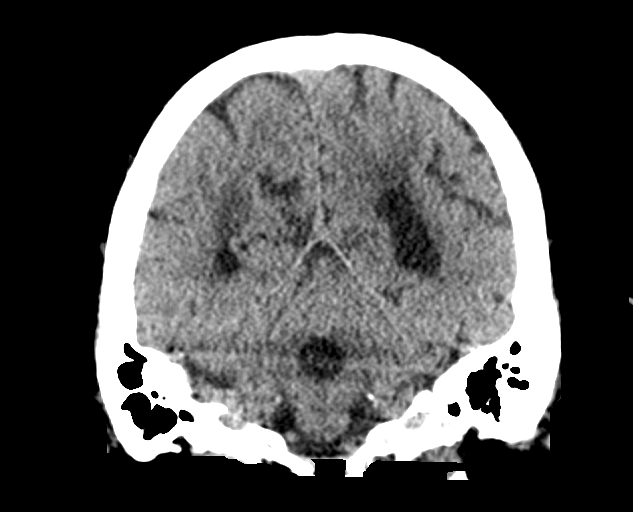
[im 30/66  brain]
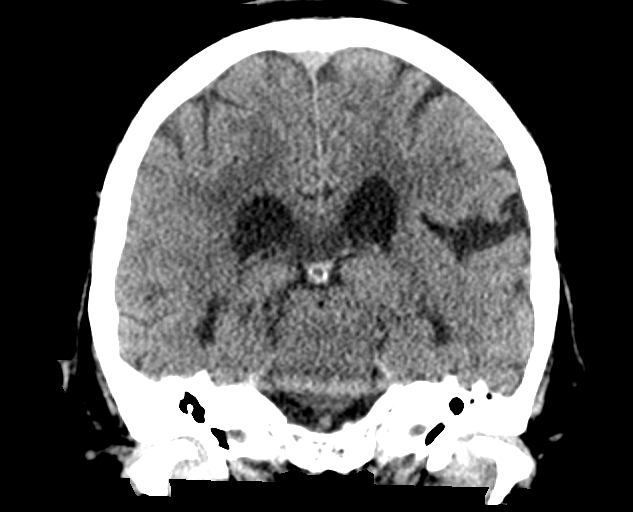
[im 36/66  brain]
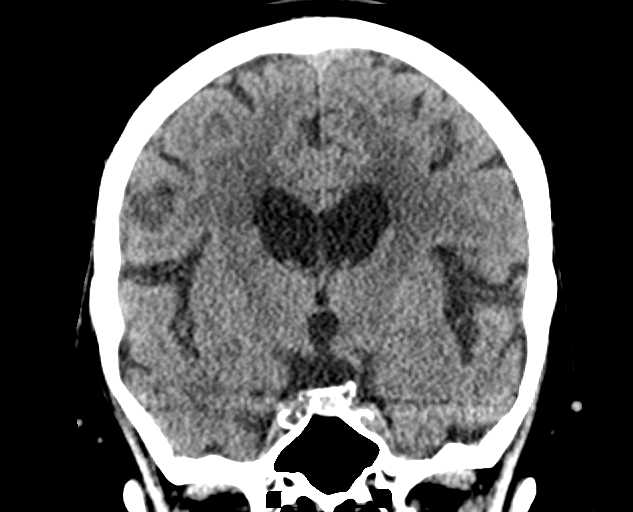

[Series 5: sagittal soft tissue · sagittal · 0.34mm/px · 3 of 54 slices shown]
[im 18/54  brain]
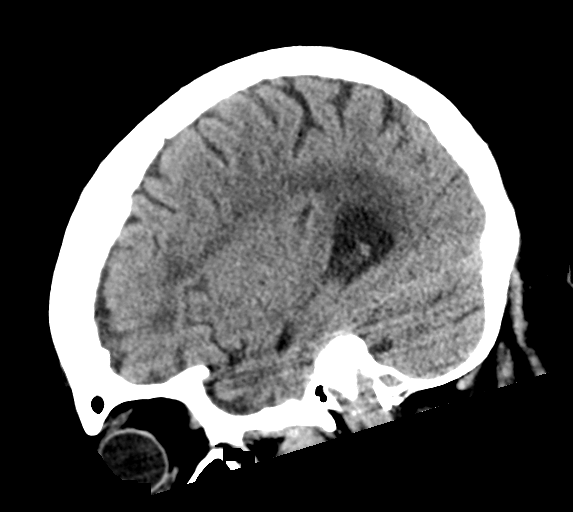
[im 27/54  brain]
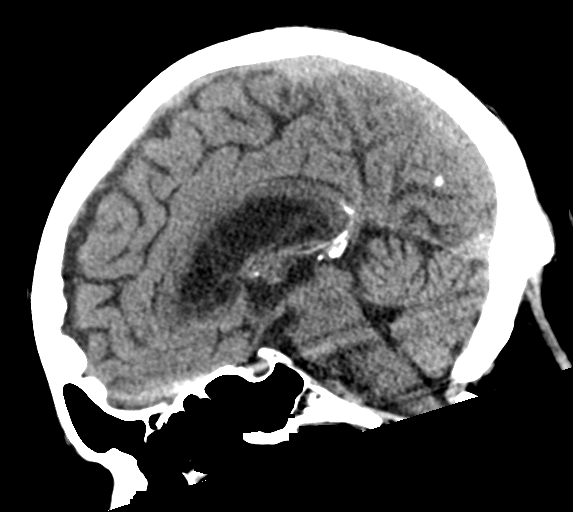
[im 36/54  brain]
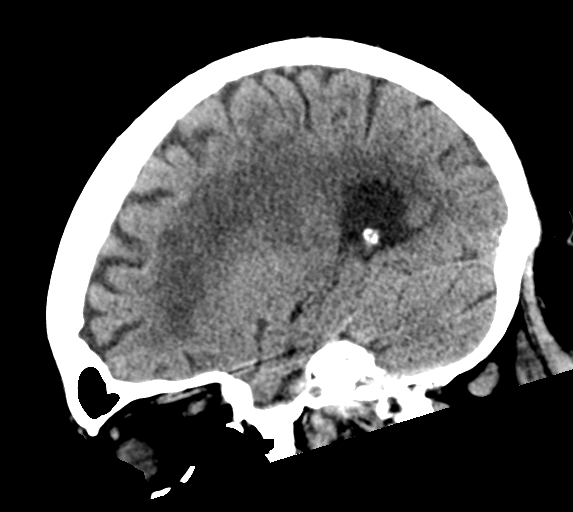

[16 of 47 positions shown; findings below may reference images not displayed]

FINDINGS: Brain: Mild chronic ischemic white matter disease is noted. Mild
diffuse cortical atrophy is noted. No mass effect or midline shift
is noted. Ventricular size is within normal limits. There is no
evidence of mass lesion, hemorrhage or acute infarction.

Vascular: No hyperdense vessel or unexpected calcification.

Skull: Normal. Negative for fracture or focal lesion.

Sinuses/Orbits: No acute finding.

Other: None.
IMPRESSION: Mild chronic ischemic white matter disease. Mild diffuse cortical
atrophy. No acute intracranial abnormality seen.

## 2020-07-03 IMAGING — DX DG RIBS W/ CHEST 3+V*L*
4 series · 4 of 4 positions shown · non-contrast
Comparison: CXR 06/10/2018

CLINICAL DATA: Anterior chest pain after fall today.

EXAM:
LEFT RIBS AND CHEST - 3+ VIEW

[chest pa]
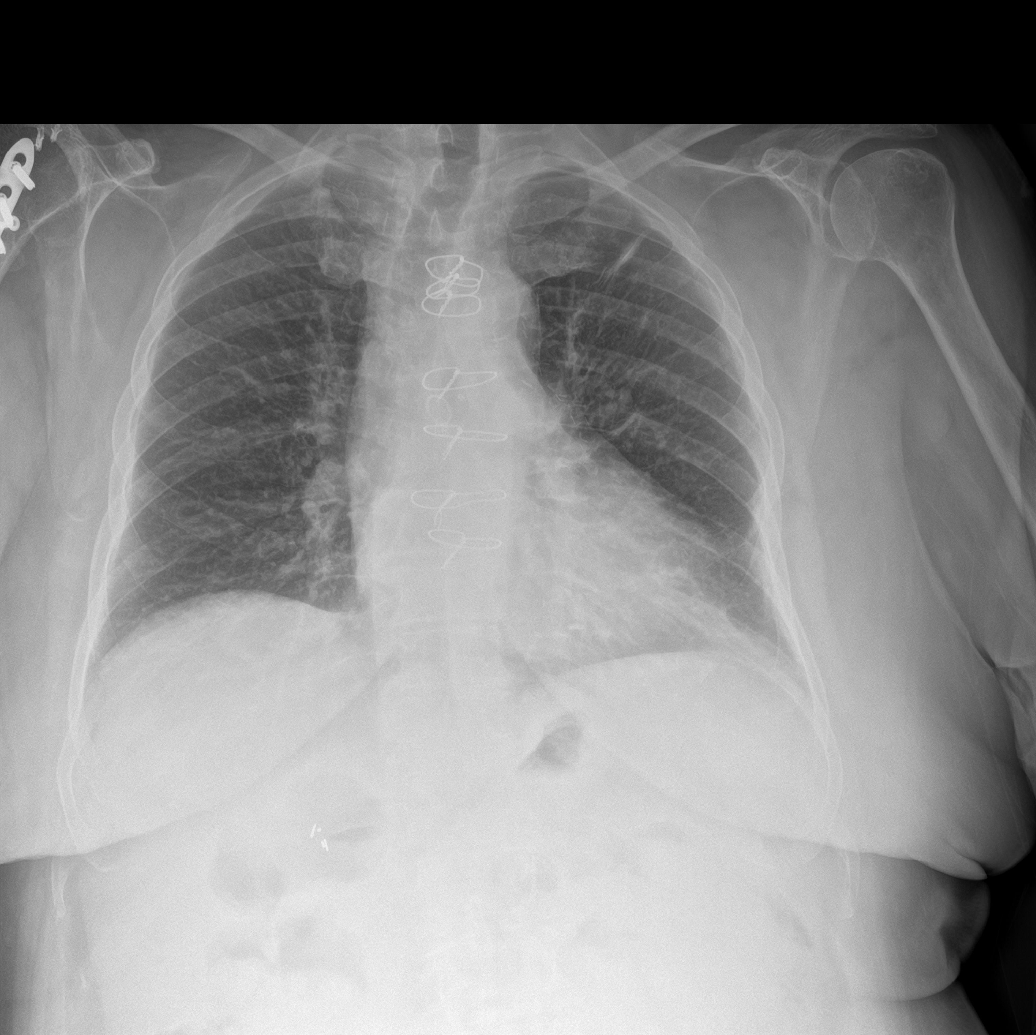

[rib pa obl (1 of 2)]
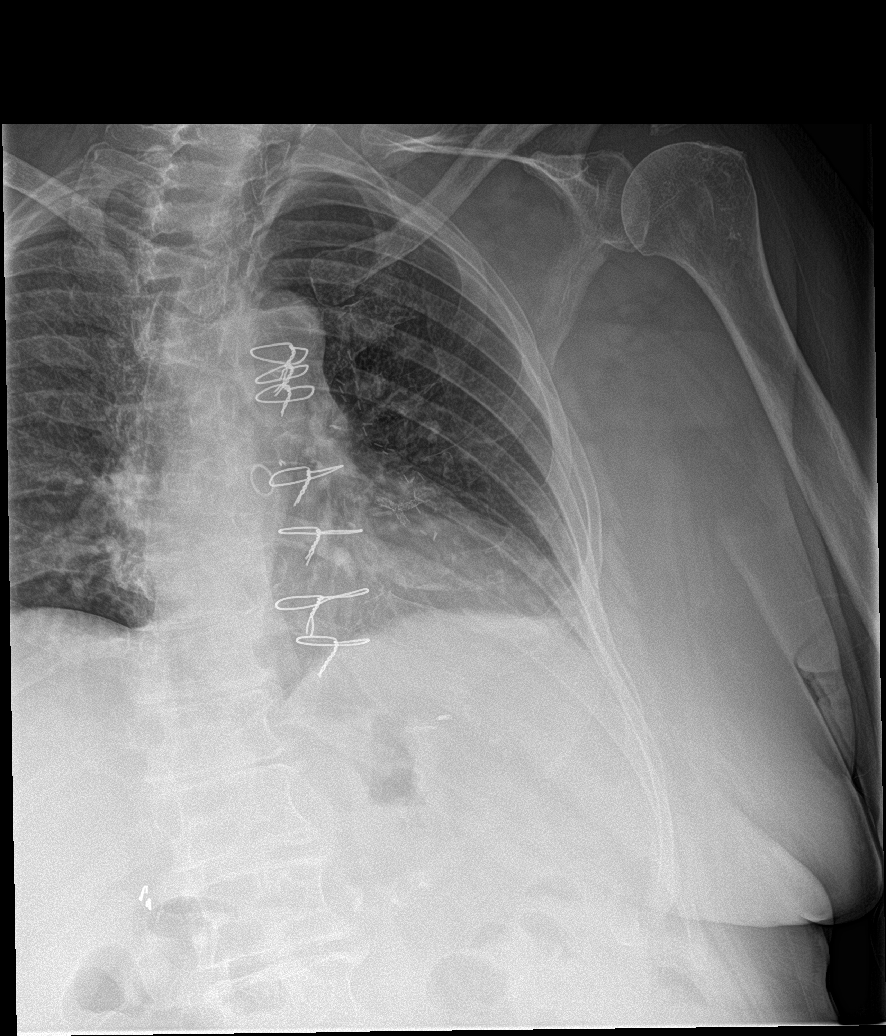

[rib pa obl (2 of 2)]
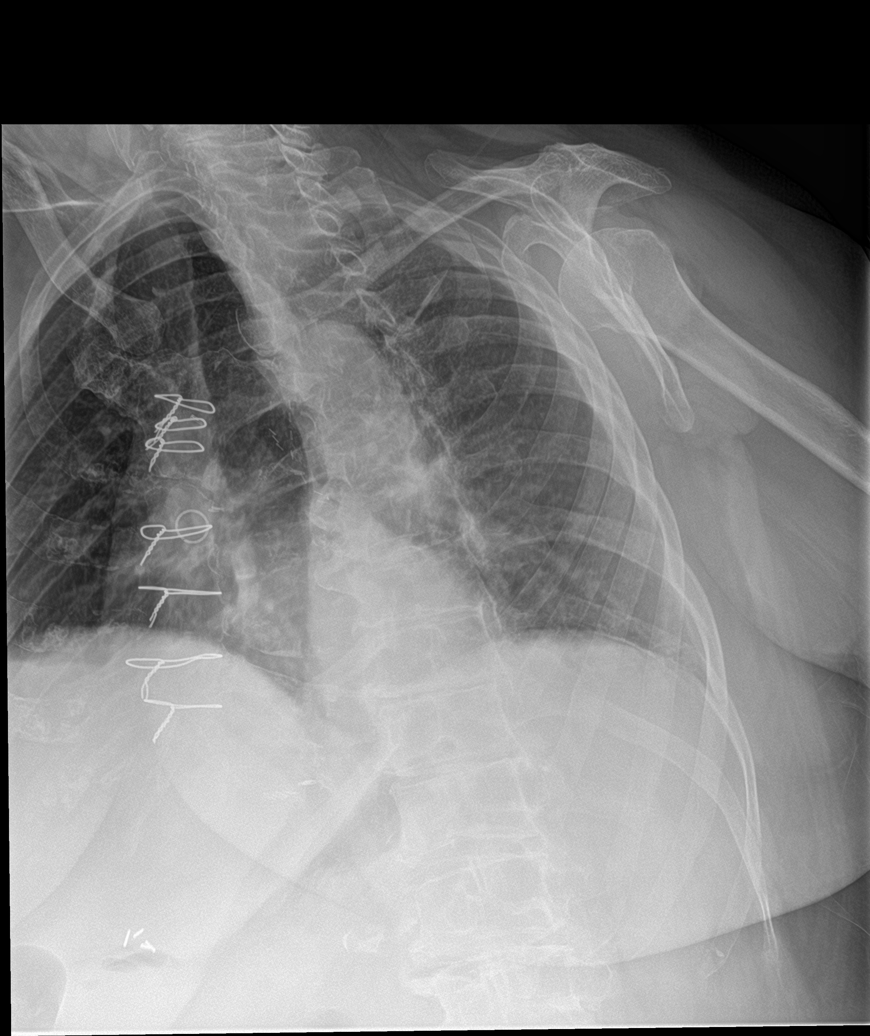

[rib pa]
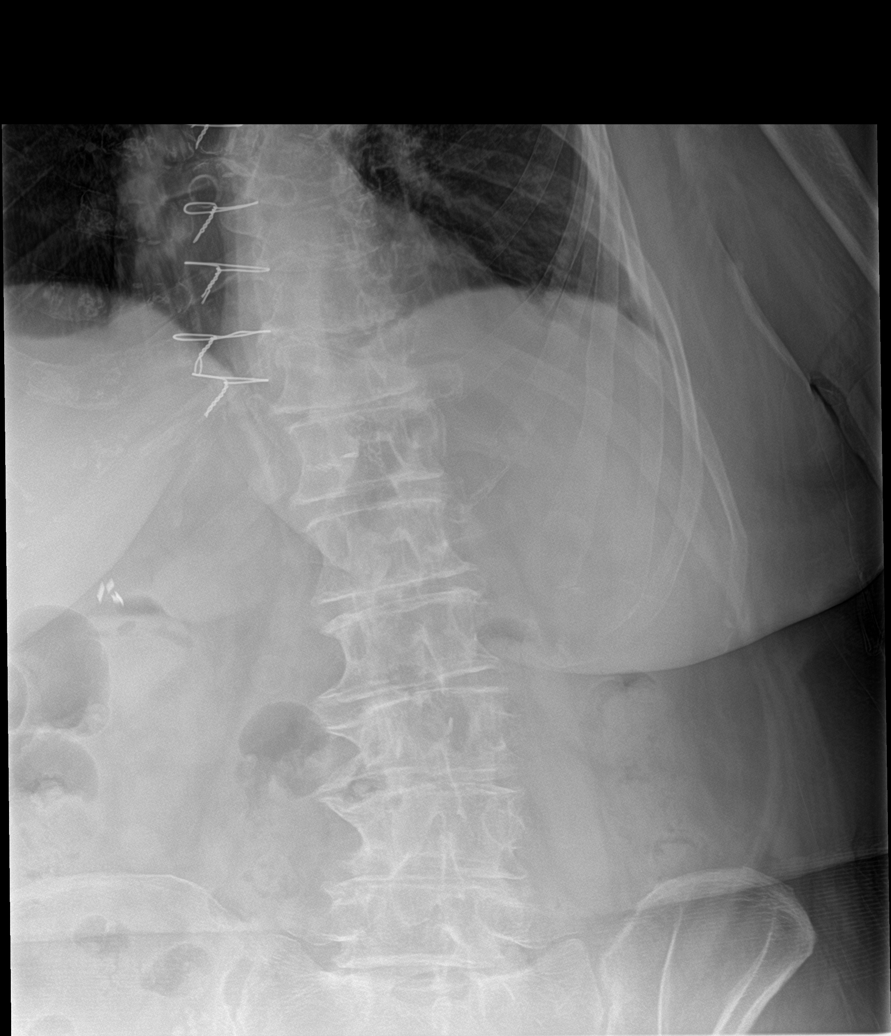

[4 of 4 positions shown; findings below may reference images not displayed]

FINDINGS: Status post CABG. Top-normal size heart. Minimal aortic
atherosclerosis without aneurysmal dilatation. Scarring in the left
upper lobe with left basilar atelectasis. No pulmonary consolidation
or pneumothorax. No effusion. No acute displaced rib fracture.
Thoracolumbar spondylosis with multilevel degenerative disc space
narrowing and endplate spurring identified. Cholecystectomy clips
are seen in the right upper quadrant.
IMPRESSION: 1. No acute appearing rib fracture or pneumothorax. No pulmonary
consolidation.
2. Left upper lobe scarring and left basilar atelectasis.
3. Aortic atherosclerosis.

## 2020-07-09 IMAGING — DX DG RIBS W/ CHEST 3+V*L*
4 series · 4 of 4 positions shown · non-contrast
Comparison: Radiographs 06/29/2018 and 06/10/2018.

CLINICAL DATA: Increased left-sided chest and flank pain with deep
inspiration and coughing.

EXAM:
LEFT RIBS AND CHEST - 3+ VIEW

[chest pa]
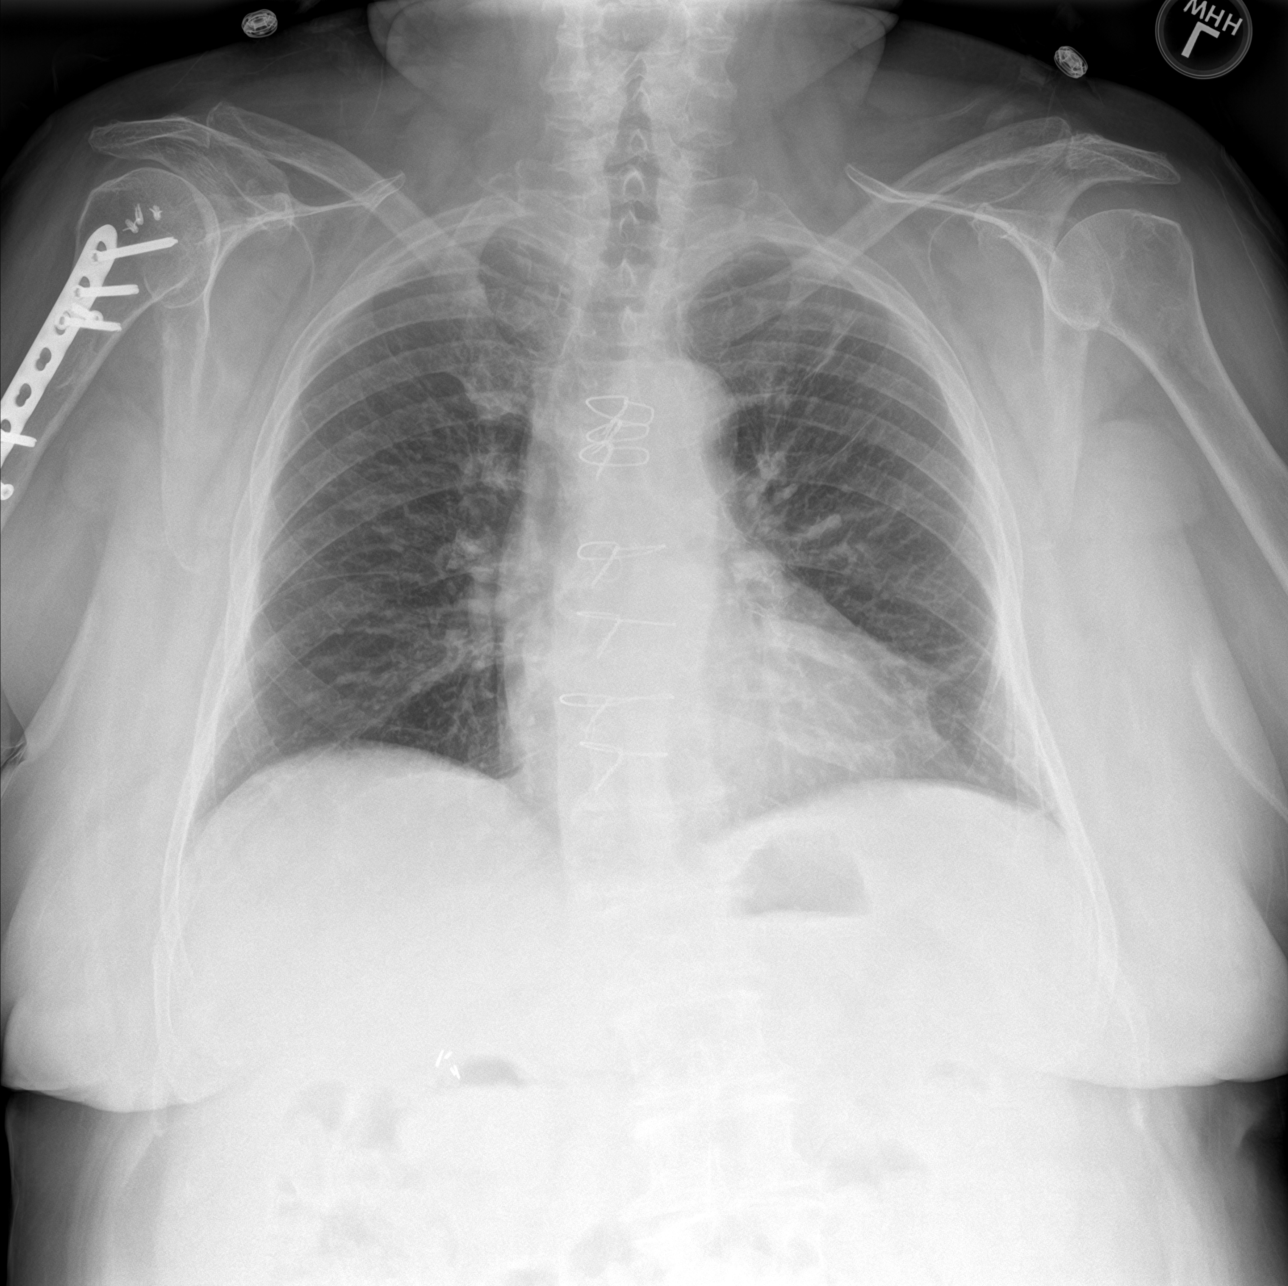

[rib pa obl (1 of 2)]
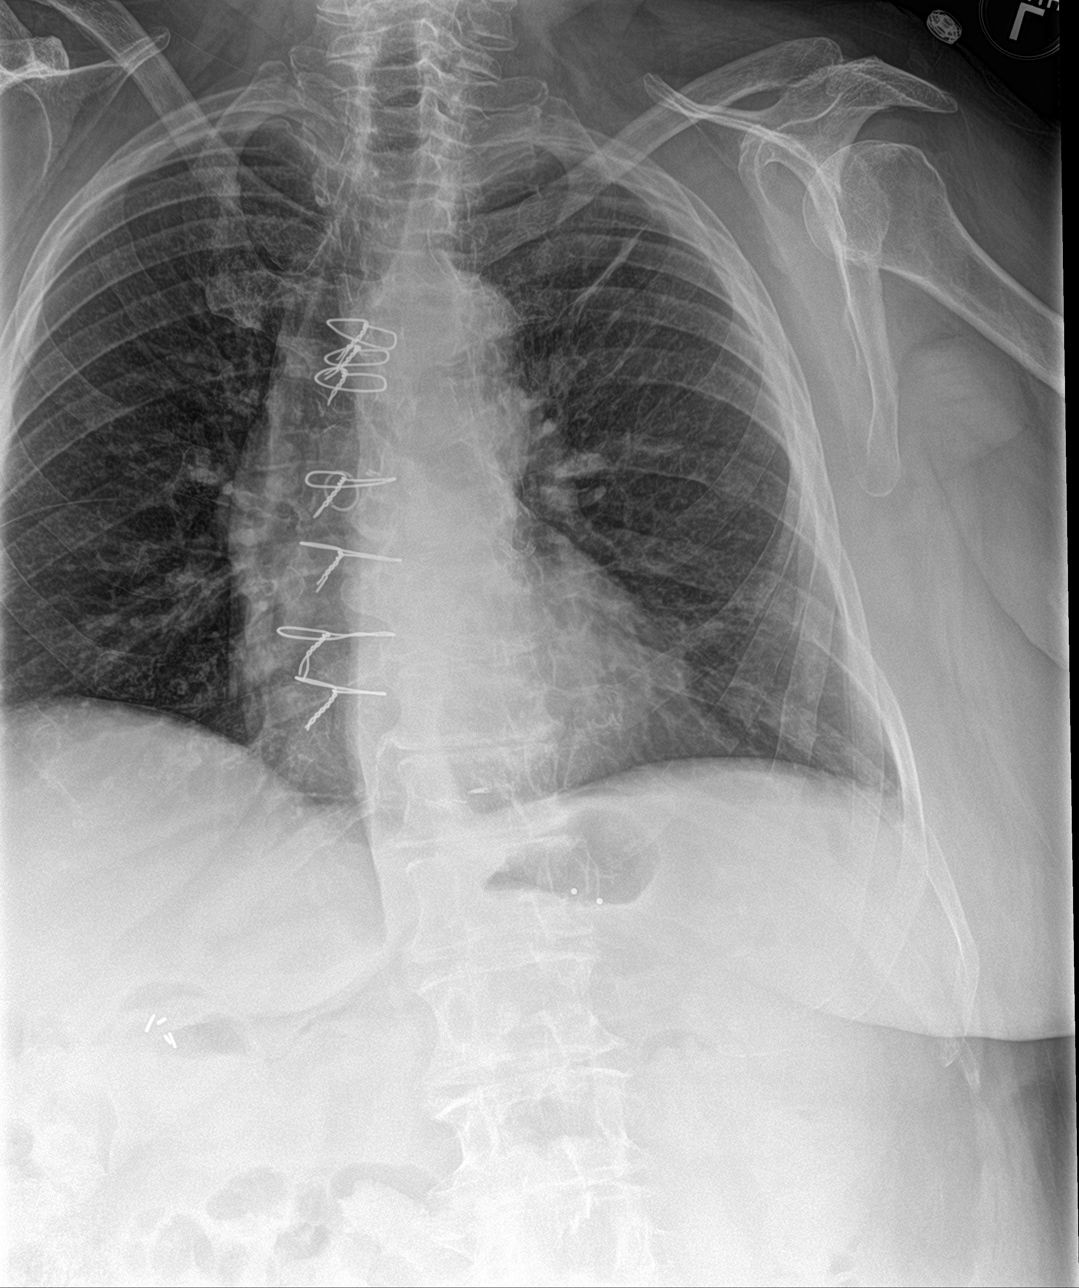

[rib pa]
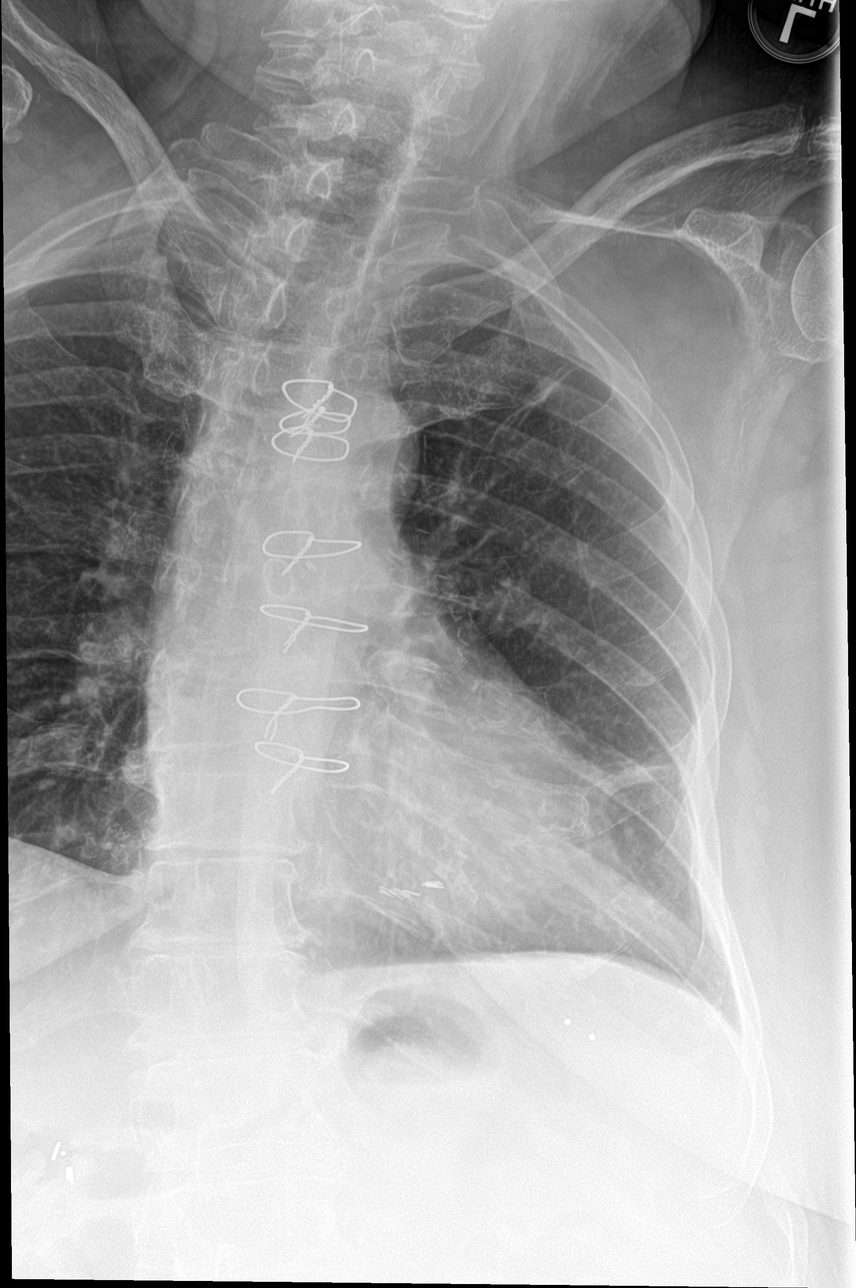

[rib pa obl (2 of 2)]
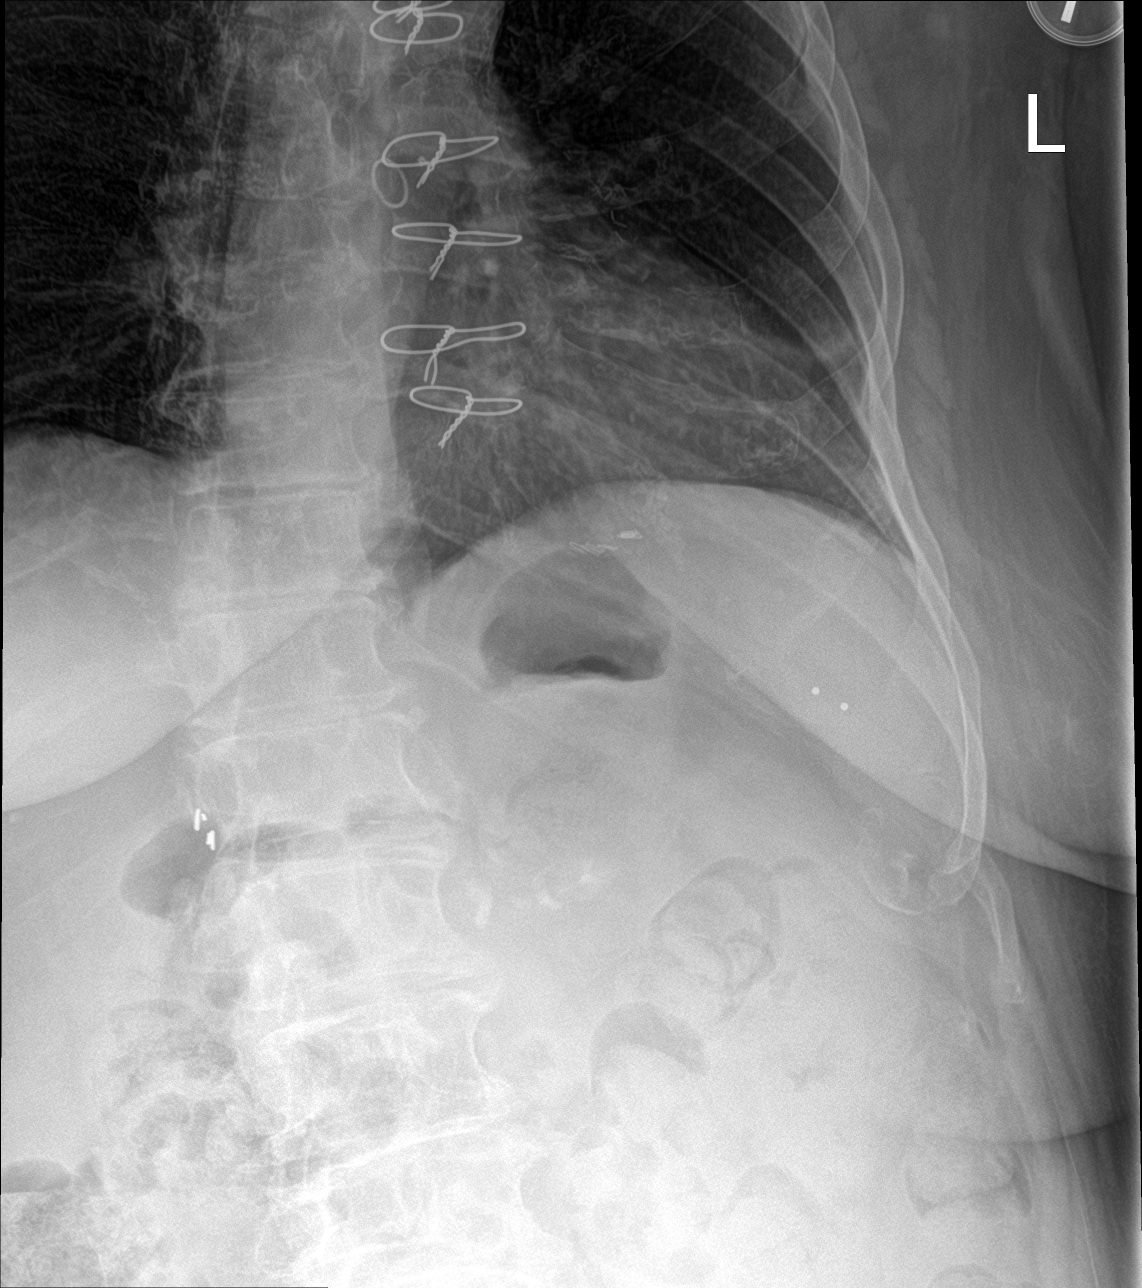

[4 of 4 positions shown; findings below may reference images not displayed]

FINDINGS: The heart size and mediastinal contours are stable status post
median sternotomy and CABG. There is stable mild linear scarring in
the left lung. The right lung is clear. There is no pleural effusion
or pneumothorax.

Left rib detail images demonstrate no acute rib fracture or focal
rib lesion. Previous cholecystectomy and proximal right humeral ORIF
noted.
IMPRESSION: No evidence of acute left-sided rib fracture, pleural effusion or
pneumothorax. Stable linear left lung scarring.

## 2020-08-27 IMAGING — CT CT HEAD W/O CM
3 series · 16 of 47 positions shown, 19 images · non-contrast
Comparison: Head CT dated 06/10/2018

CLINICAL DATA: Seven 11-year-old female with head trauma.

EXAM:
CT HEAD WITHOUT CONTRAST
TECHNIQUE: Contiguous axial images were obtained from the base of the skull
through the vertex without intravenous contrast.

[Series 2: head trauma wo · axial · 0.44mm/px · z∈[+1511,+1636]mm · 10 of 31 slices shown, 13 images]
[im 3/31  brain]
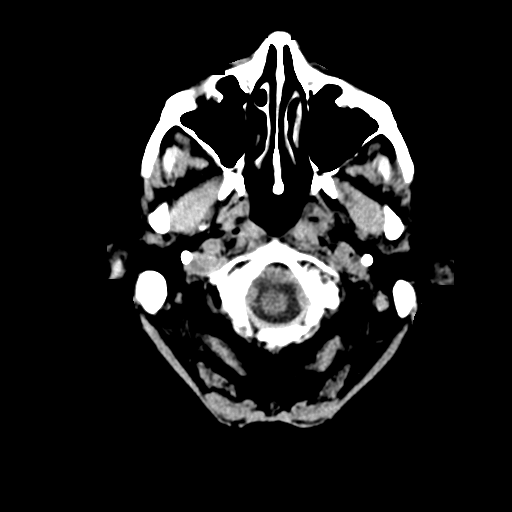
[im 3/31  bone]
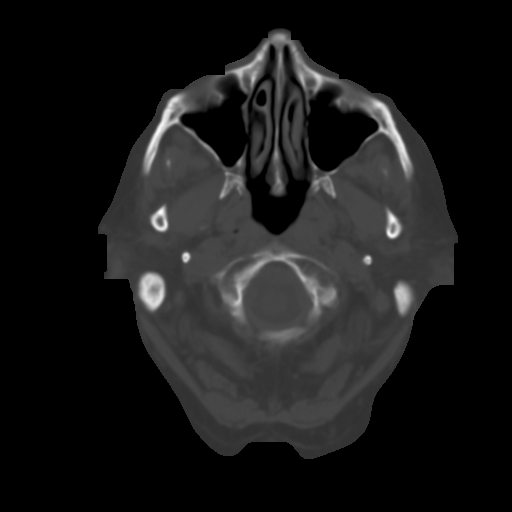
[im 6/31  brain]
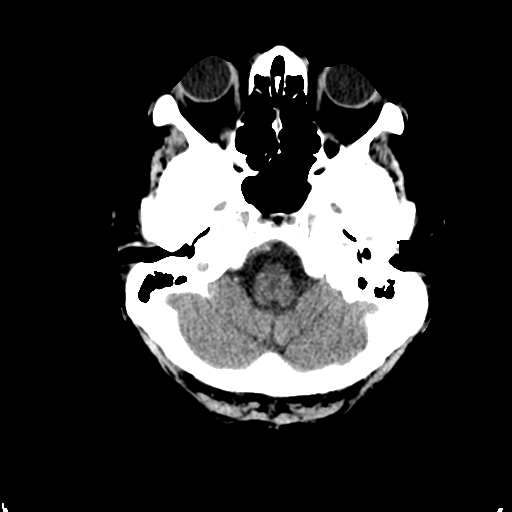
[im 9/31  brain]
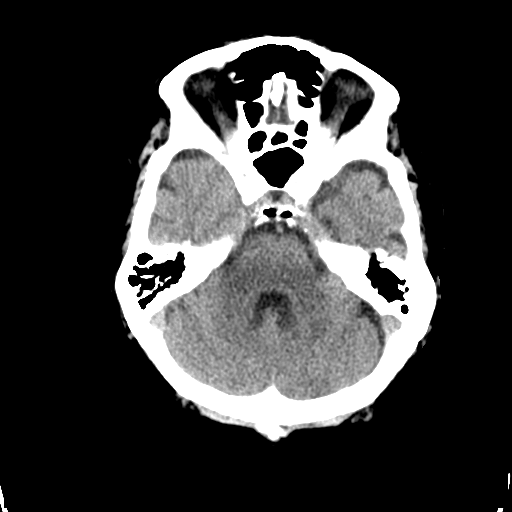
[im 11/31  brain]
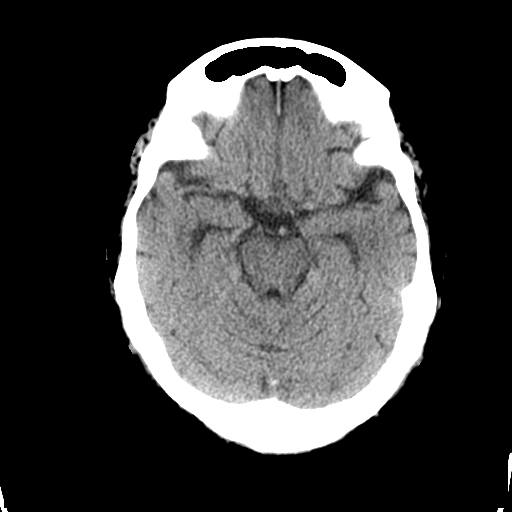
[im 14/31  brain]
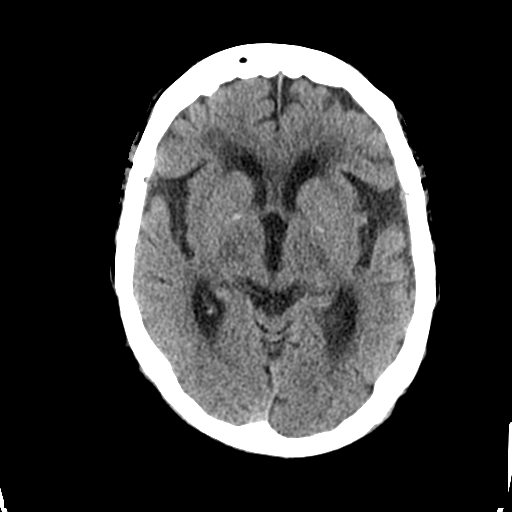
[im 14/31  bone]
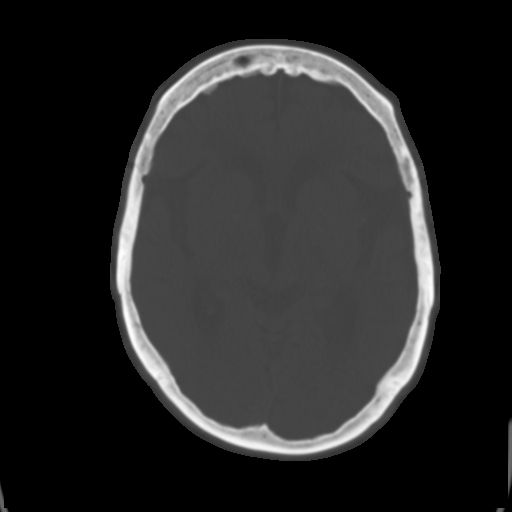
[im 17/31  brain]
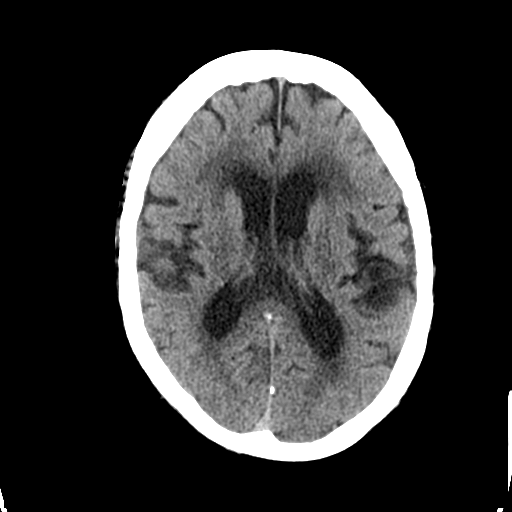
[im 20/31  brain]
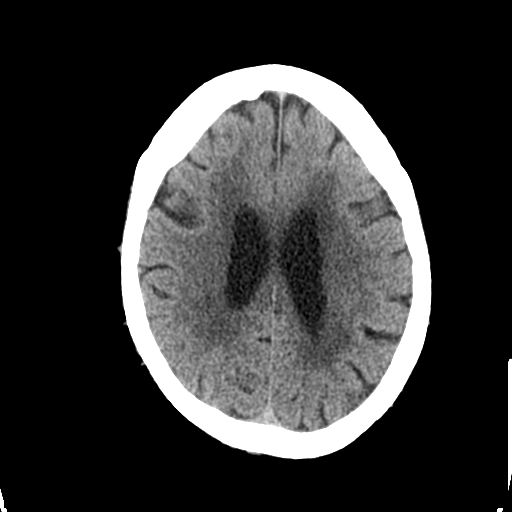
[im 23/31  brain]
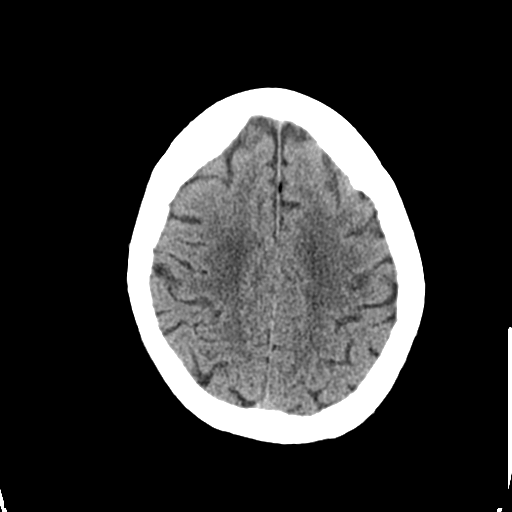
[im 25/31  brain]
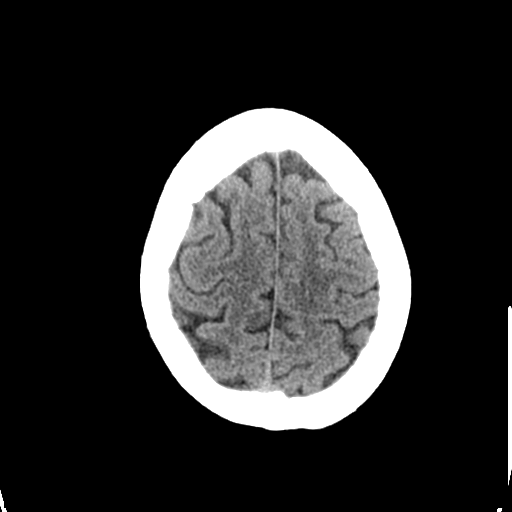
[im 25/31  bone]
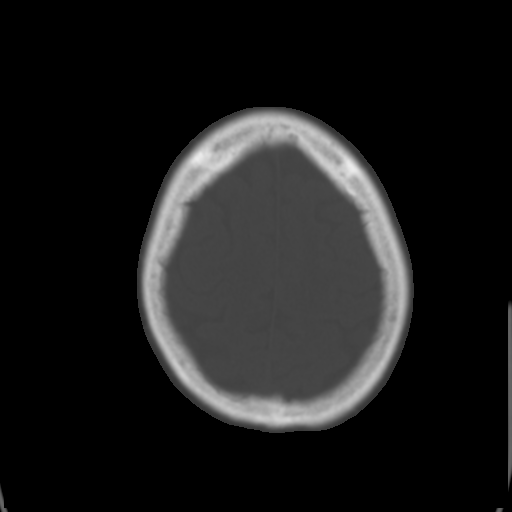
[im 28/31  brain]
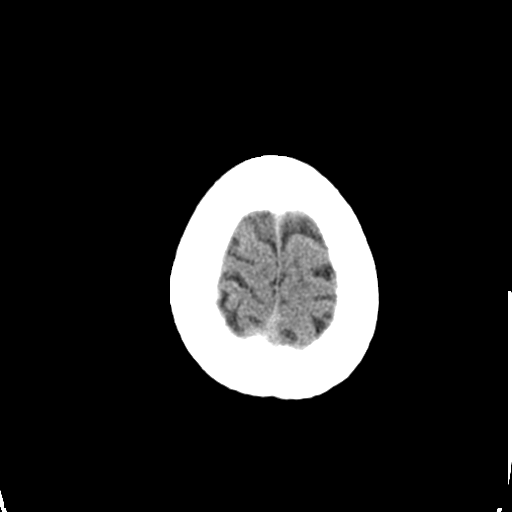

[Series 4: coronal soft tissue · coronal · 0.35mm/px · 3 of 68 slices shown]
[im 23/68  brain]
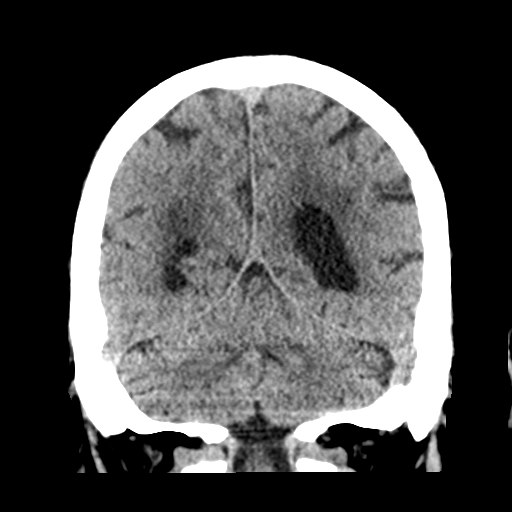
[im 30/68  brain]
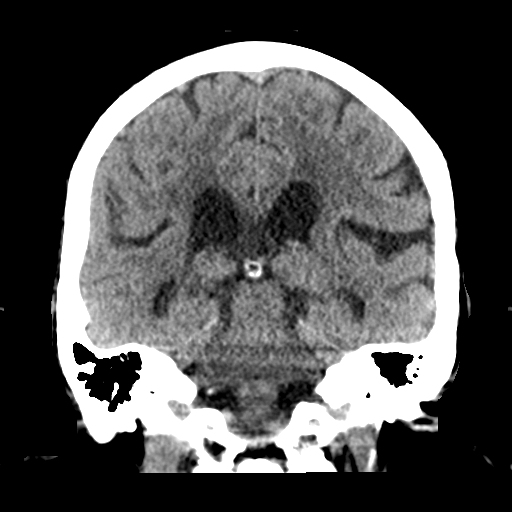
[im 38/68  brain]
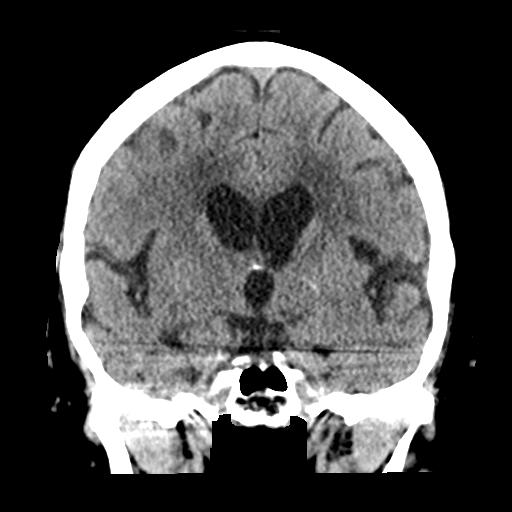

[Series 5: sagittal soft tissue · sagittal · 0.34mm/px · 3 of 58 slices shown]
[im 20/58  brain]
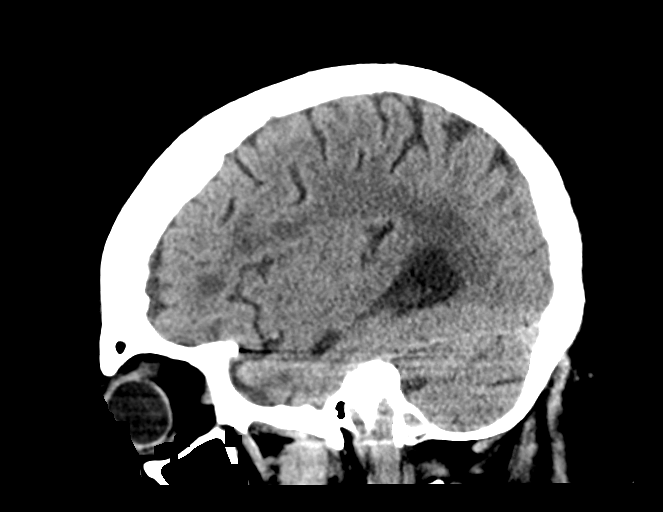
[im 29/58  brain]
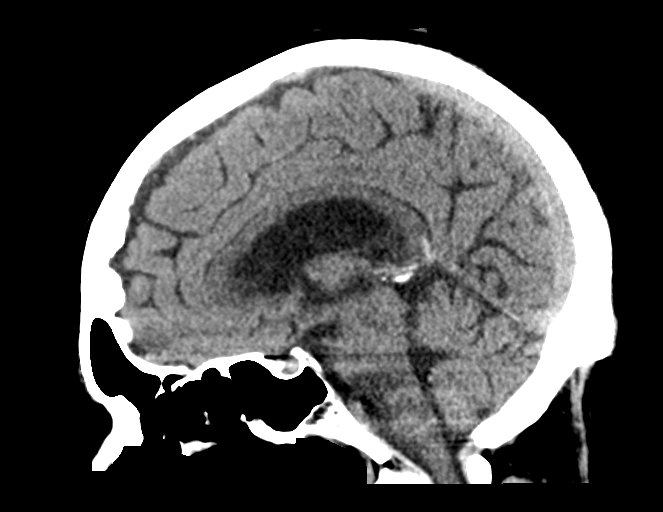
[im 39/58  brain]
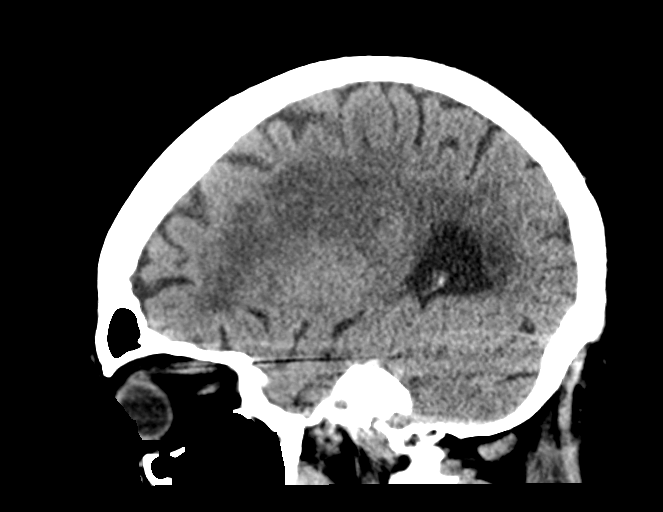

[16 of 47 positions shown; findings below may reference images not displayed]

FINDINGS: Brain: There is mild age-related atrophy and chronic microvascular
ischemic changes. No acute intracranial hemorrhage. Mass effect or
midline shift. No extra-axial fluid collection.

Vascular: No hyperdense vessel or unexpected calcification.

Skull: Normal. Negative for fracture or focal lesion.

Sinuses/Orbits: The visualized paranasal sinuses and the right
mastoid air cells are clear. Mild left mastoid effusions.

Other: Mild right temporal scalp contusion.
IMPRESSION: 1. No acute intracranial hemorrhage.
2. Age-related atrophy and chronic microvascular ischemic changes.

## 2020-08-31 IMAGING — CT CT ANGIO NECK
1 of 11 series · 5 of 33 positions shown · IV contrast (Isovue)
Comparison: Prior CT from 08/23/2018.

CLINICAL DATA: Initial evaluation for acute confusion, left-sided
facial droop.

EXAM:
CT ANGIOGRAPHY HEAD AND NECK
TECHNIQUE: Multidetector CT imaging of the head and neck was performed using
the standard protocol during bolus administration of intravenous
contrast. Multiplanar CT image reconstructions and MIPs were
obtained to evaluate the vascular anatomy. Carotid stenosis
measurements (when applicable) are obtained utilizing NASCET
criteria, using the distal internal carotid diameter as the
denominator.
CONTRAST:  100mL 6NPKB3-TUB IOPAMIDOL (6NPKB3-TUB) INJECTION 76%

[Series 10: ax thin · axial · 0.39mm/px · z∈[+12,+251]mm · 5 of 359 slices shown]
[im 60/359  soft-tissue]
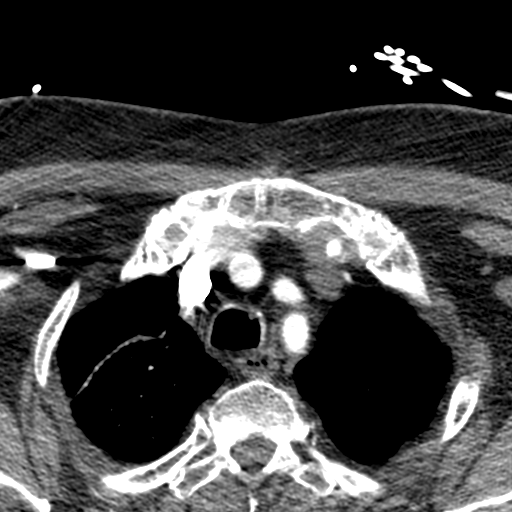
[im 120/359  bone]
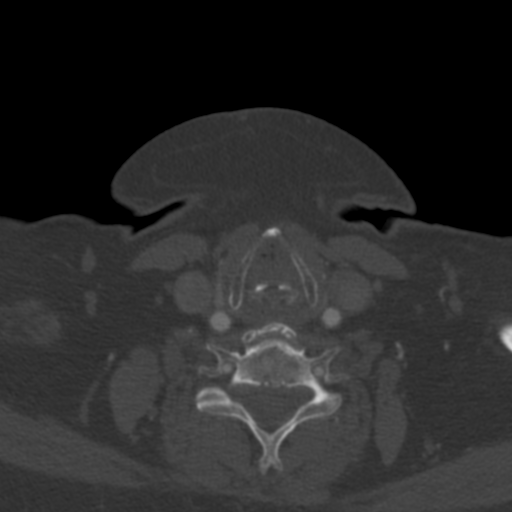
[im 180/359  soft-tissue]
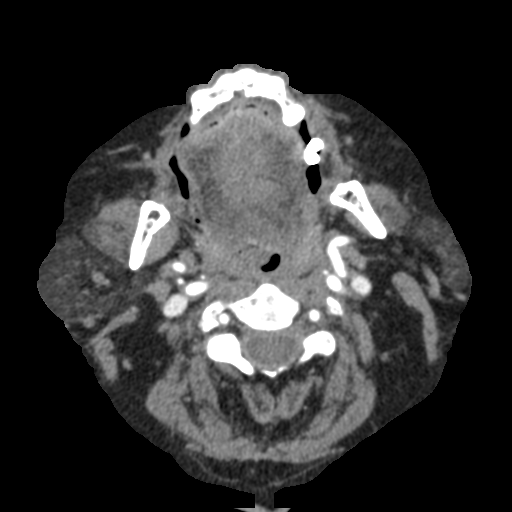
[im 239/359  bone]
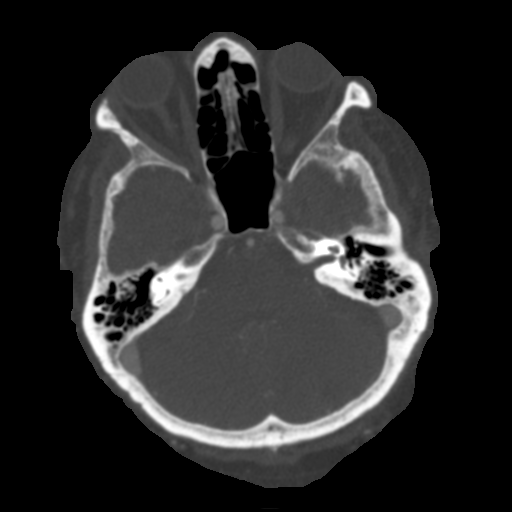
[im 299/359  soft-tissue]
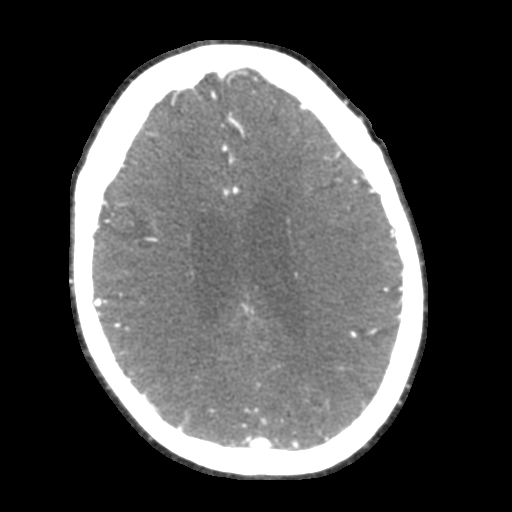

[5 of 33 positions shown; findings below may reference images not displayed]

FINDINGS: CT HEAD FINDINGS

Brain: Moderate cerebral atrophy with chronic small vessel ischemic
disease. No acute intracranial hemorrhage. No acute large vessel
territory infarct. No mass lesion, midline shift or mass effect. No
hydrocephalus. No extra-axial fluid collection.

Vascular: No hyperdense vessel. Scattered vascular calcifications
noted within the carotid siphons.

Skull: Scalp soft tissues and calvarium within normal limits.

Sinuses: Partial opacification of the visualized right sphenoid
sinus. Visualized paranasal sinuses and mastoid air cells are
otherwise clear.

Orbits: Globes and orbital soft tissues within normal limits.

Review of the MIP images confirms the above findings

CTA NECK FINDINGS

Aortic arch: Visualized aortic arch of normal caliber with normal 3
vessel morphology. No hemodynamically significant stenosis about the
origin of the great vessels. Visualized subclavian arteries widely
patent.

Right carotid system: Right common carotid artery tortuous
proximally but widely patent to the bifurcation. Scattered mixed
plaque about the right bifurcation without hemodynamically
significant stenosis. Right ICA tortuous but widely patent to the
skull base without stenosis, dissection, or occlusion.

Left carotid system: Left common carotid artery tortuous proximally
but widely patent to the bifurcation without stenosis. Mixed
atheromatous plaque about the left bifurcation without
hemodynamically significant stenosis. Left ICA tortuous but widely
patent distally to the skull base without stenosis, dissection or
occlusion.

Vertebral arteries: Both of the vertebral arteries arise from the
subclavian arteries. Vertebral arteries widely patent within the
neck without stenosis, dissection, or occlusion.

Skeleton: No acute osseus abnormality. No worrisome lytic or blastic
osseous lesions. Moderate cervical spondylolysis at C5-6 and C6-7.

Other neck: Visualized soft tissues of the neck demonstrate no acute
finding. Salivary glands normal. Thyroid normal. No adenopathy.

Upper chest: Visualized upper chest within normal limits. Sequelae
of prior CABG partially visualized. Postsurgical changes and/or
scarring noted at the lung apices bilaterally.

Review of the MIP images confirms the above findings

CTA HEAD FINDINGS

Anterior circulation: Petrous segments widely patent bilaterally.
Moderate to advanced atheromatous change throughout the carotid
siphons bilaterally. Associated moderate approximate 50% stenosis at
the supraclinoid left ICA, with moderate to severe approximate 75%
stenosis at the supraclinoid right ICA. ICA termini patent
bilaterally. Left A1 dominant and widely patent. Right A1 diminutive
with severe proximal right A1 stenosis (series 11, image 78). Normal
anterior communicating artery. Short-segment severe distal left A2
stenosis (series 11, image 61). Anterior cerebral arteries otherwise
patent to its distal aspects. M1 segments patent without high-grade
stenosis or occlusion. Normal MCA bifurcations. Short-segment severe
proximal left M2 stenosis (series 10, image 100). No proximal M2
occlusion. Distal MCA branches demonstrate multifocal atheromatous
irregularity but are well perfused and fairly symmetric.

Posterior circulation: Vertebral arteries patent to the
vertebrobasilar junction without stenosis. Posterior inferior
cerebral arteries patent bilaterally. Basilar patent to its distal
aspect without stenosis. Superior cerebral arteries patent
bilaterally. Left PCA supplied via the basilar. Fetal type origin of
the right PCA. Multifocal moderate to severe bilateral P2 stenoses,
right worse than left. PCAs are patent to their distal aspects.

Venous sinuses: Patent.

Anatomic variants: Fetal type origin of the right PCA. Hypoplastic
right P1 segment.

Delayed phase: No abnormal enhancement.

Review of the MIP images confirms the above findings
IMPRESSION: CT HEAD IMPRESSION

1. No acute intracranial abnormality.
2. Moderate cerebral atrophy with chronic small vessel ischemic
disease.

CTA HEAD AND NECK IMPRESSION

1. Negative CTA for emergent large vessel occlusion.
2. Moderate to advanced atherosclerotic change throughout the
intracranial circulation with multifocal moderate to severe stenoses
as detailed above. Changes most notable at the supraclinoid ICAs,
right A1 segment, and bilateral P2 segments.
3. Mild atheromatous change about the carotid bifurcations without
significant stenosis. No critical flow limiting stenosis within the
neck.

## 2020-09-01 IMAGING — MR MR HEAD W/O CM
9 of 10 series · 38 of 48 positions shown · non-contrast
Comparison: CT head 08/27/2018

CLINICAL DATA: Confusion.  Left facial droop.  Recent fall.

EXAM:
MRI HEAD WITHOUT CONTRAST
TECHNIQUE: Multiplanar, multiecho pulse sequences of the brain and surrounding
structures were obtained without intravenous contrast.

[Series 2: DWI · axial · 3.0mm · 0.75mm/px · z∈[-62,+100]mm · 6 of 55 slices shown (1 of 4)]
[im 1/55]
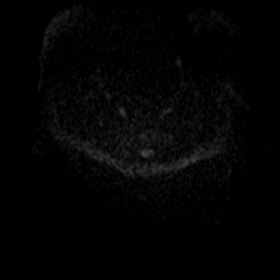
[im 11/55]
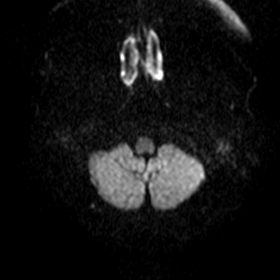
[im 22/55]
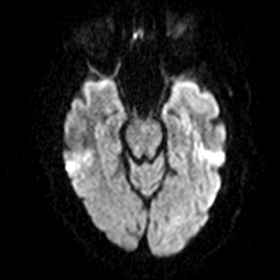
[im 33/55]
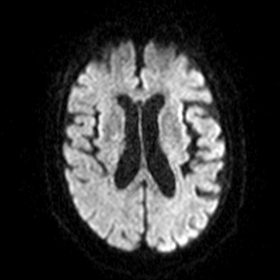
[im 44/55]
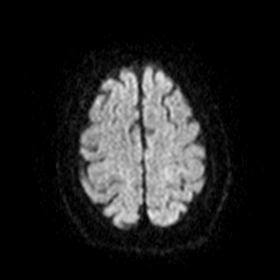
[im 55/55]
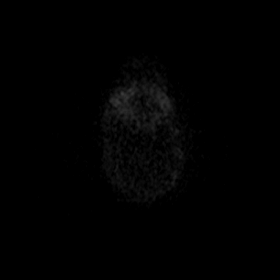

[Series 3: DWI · axial · 3.0mm · 0.75mm/px · z∈[-62,+100]mm · 7 of 55 slices shown (2 of 4)]
[im 1/55]
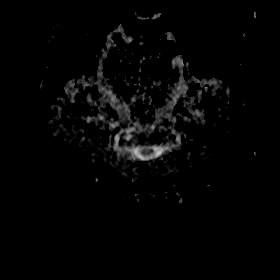
[im 10/55]
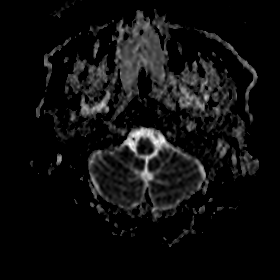
[im 19/55]
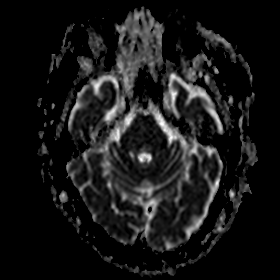
[im 28/55]
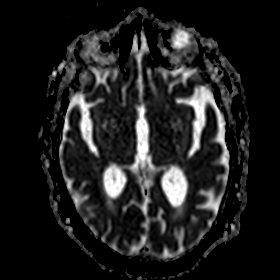
[im 37/55]
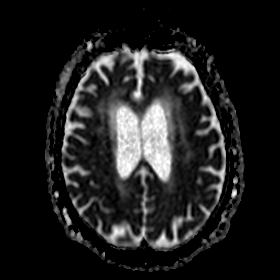
[im 46/55]
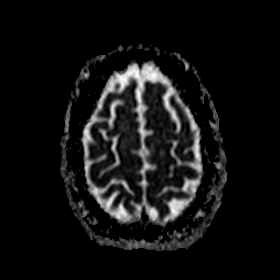
[im 55/55]
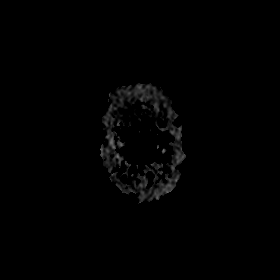

[Series 4: DWI · coronal · 5.0mm · 0.48mm/px · 5 of 38 slices shown (3 of 4)]
[im 1/38]
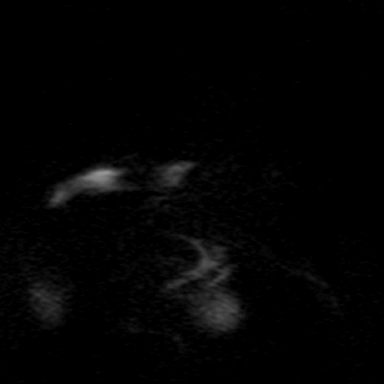
[im 10/38]
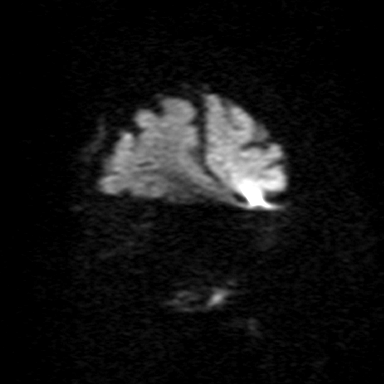
[im 19/38]
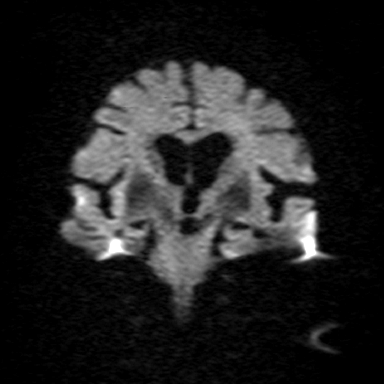
[im 28/38]
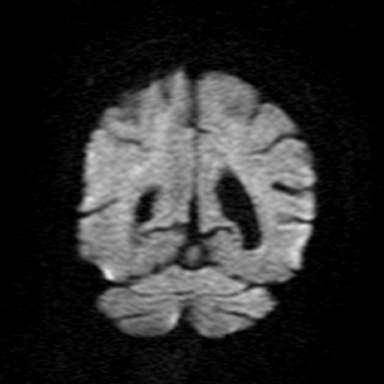
[im 38/38]
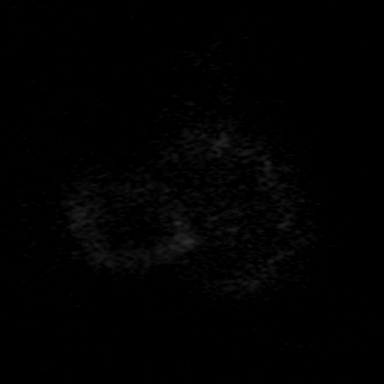

[Series 5: DWI · coronal · 5.0mm · 0.48mm/px · 5 of 38 slices shown (4 of 4)]
[im 1/38]
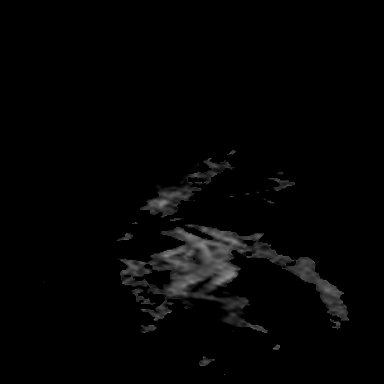
[im 10/38]
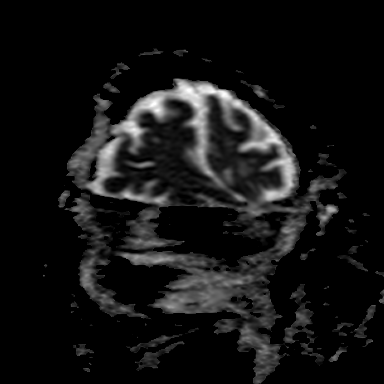
[im 19/38]
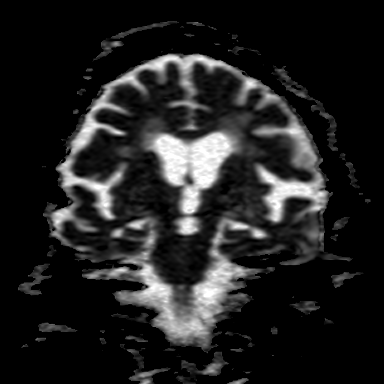
[im 28/38]
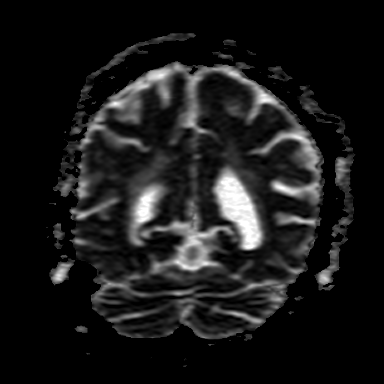
[im 38/38]
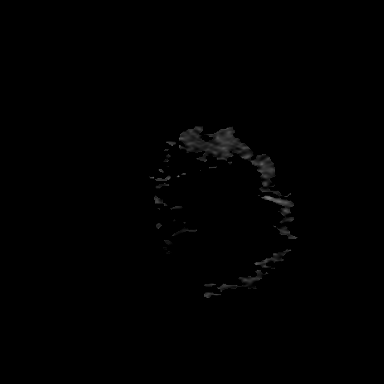

[Series 6: T1 · sagittal · 5.0mm · 0.42mm/px · 3 of 21 slices shown (1 of 2)]
[im 1/21]
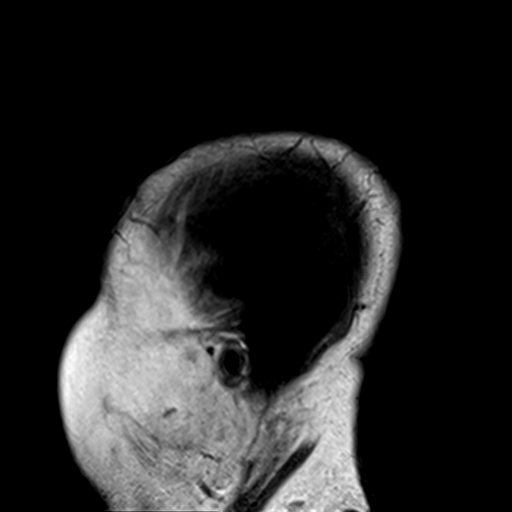
[im 11/21]
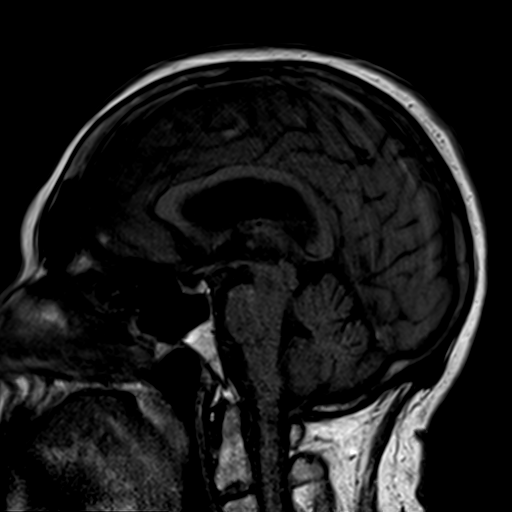
[im 21/21]
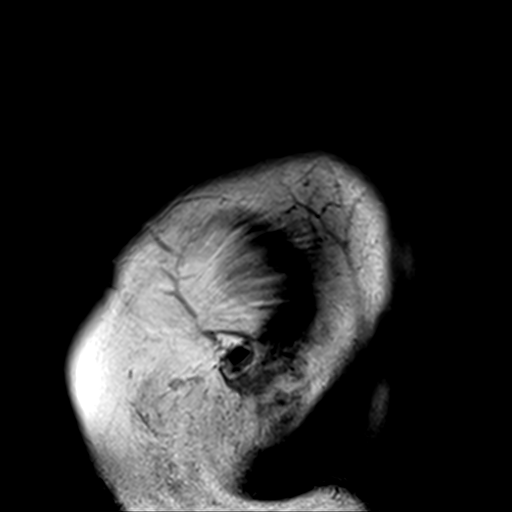

[Series 7: T2 · axial · 5.0mm · 0.48mm/px · z∈[-52,+90]mm · 3 of 23 slices shown (1 of 2)]
[im 1/23]
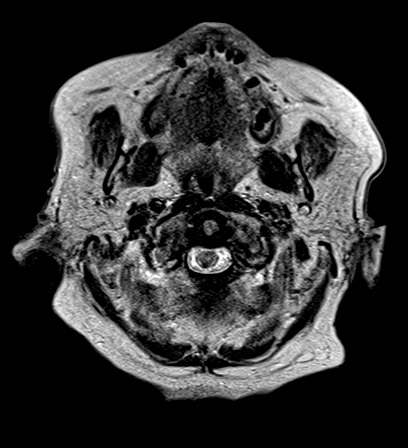
[im 12/23]
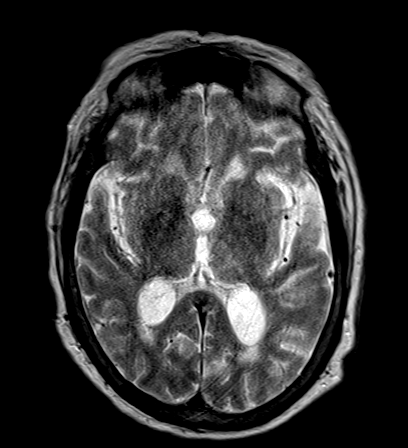
[im 23/23]
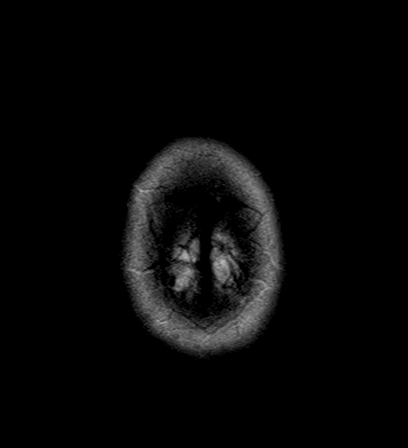

[Series 8: FLAIR · axial · 5.0mm · 0.89mm/px · z∈[-52,+90]mm · 3 of 23 slices shown]
[im 1/23]
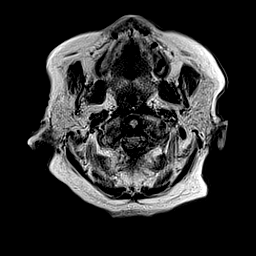
[im 12/23]
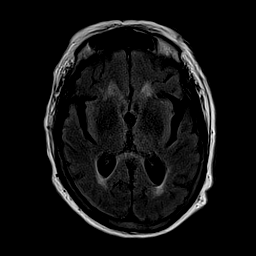
[im 23/23]
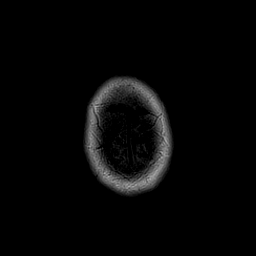

[Series 9: T1 · axial · 2.0mm · 0.42mm/px · z∈[-52,-34]mm · 2 of 73 slices shown (2 of 2)]
[im 1/73]
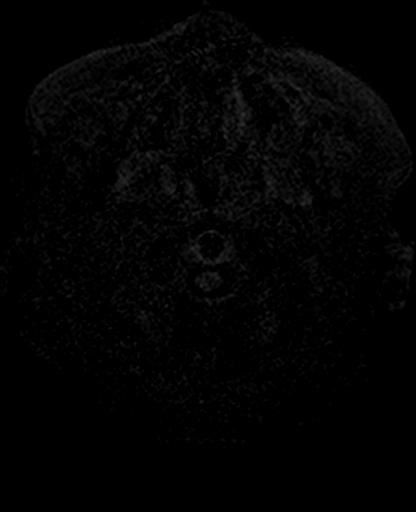
[im 10/73]
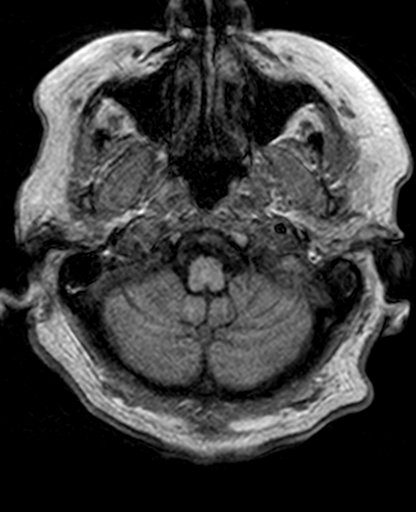

[Series 11: T2 · coronal · 5.0mm · 0.46mm/px · 4 of 30 slices shown (2 of 2)]
[im 1/30]
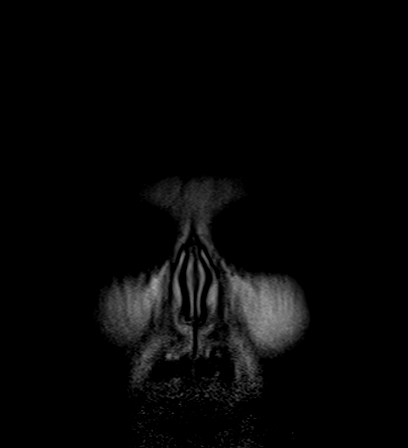
[im 10/30]
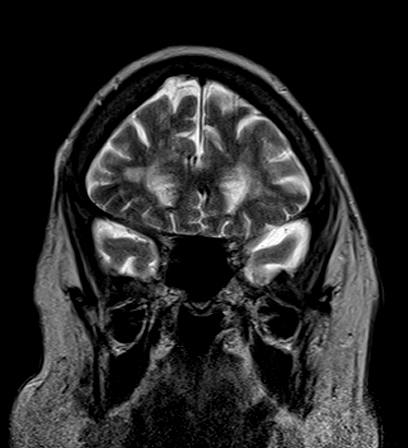
[im 20/30]
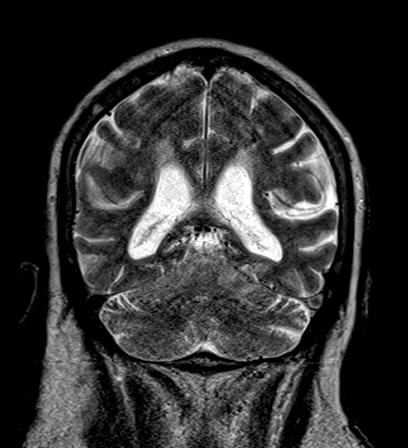
[im 30/30]
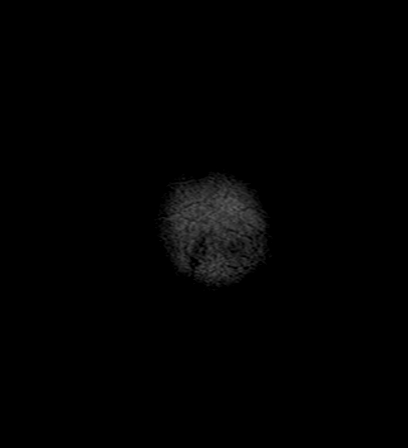

[38 of 48 positions shown; findings below may reference images not displayed]

FINDINGS: Brain: Negative for acute infarct. Moderate atrophy and moderate
chronic microvascular ischemic change in the white matter and pons.
Negative for hemorrhage or mass

Image quality degraded by motion

Vascular: Normal arterial flow voids

Skull and upper cervical spine: Negative

Sinuses/Orbits: Negative

Other: None
IMPRESSION: No acute abnormality. Moderate atrophy and moderate chronic
microvascular ischemic change in the white matter and pons.

## 2020-09-17 IMAGING — DX DG CHEST 2V
2 series · 2 of 2 positions shown · non-contrast
Comparison: 06/29/2018 chest radiograph.

CLINICAL DATA: 81 y/o  F; cough and shortness of breath.

EXAM:
CHEST - 2 VIEW

[chest pa]
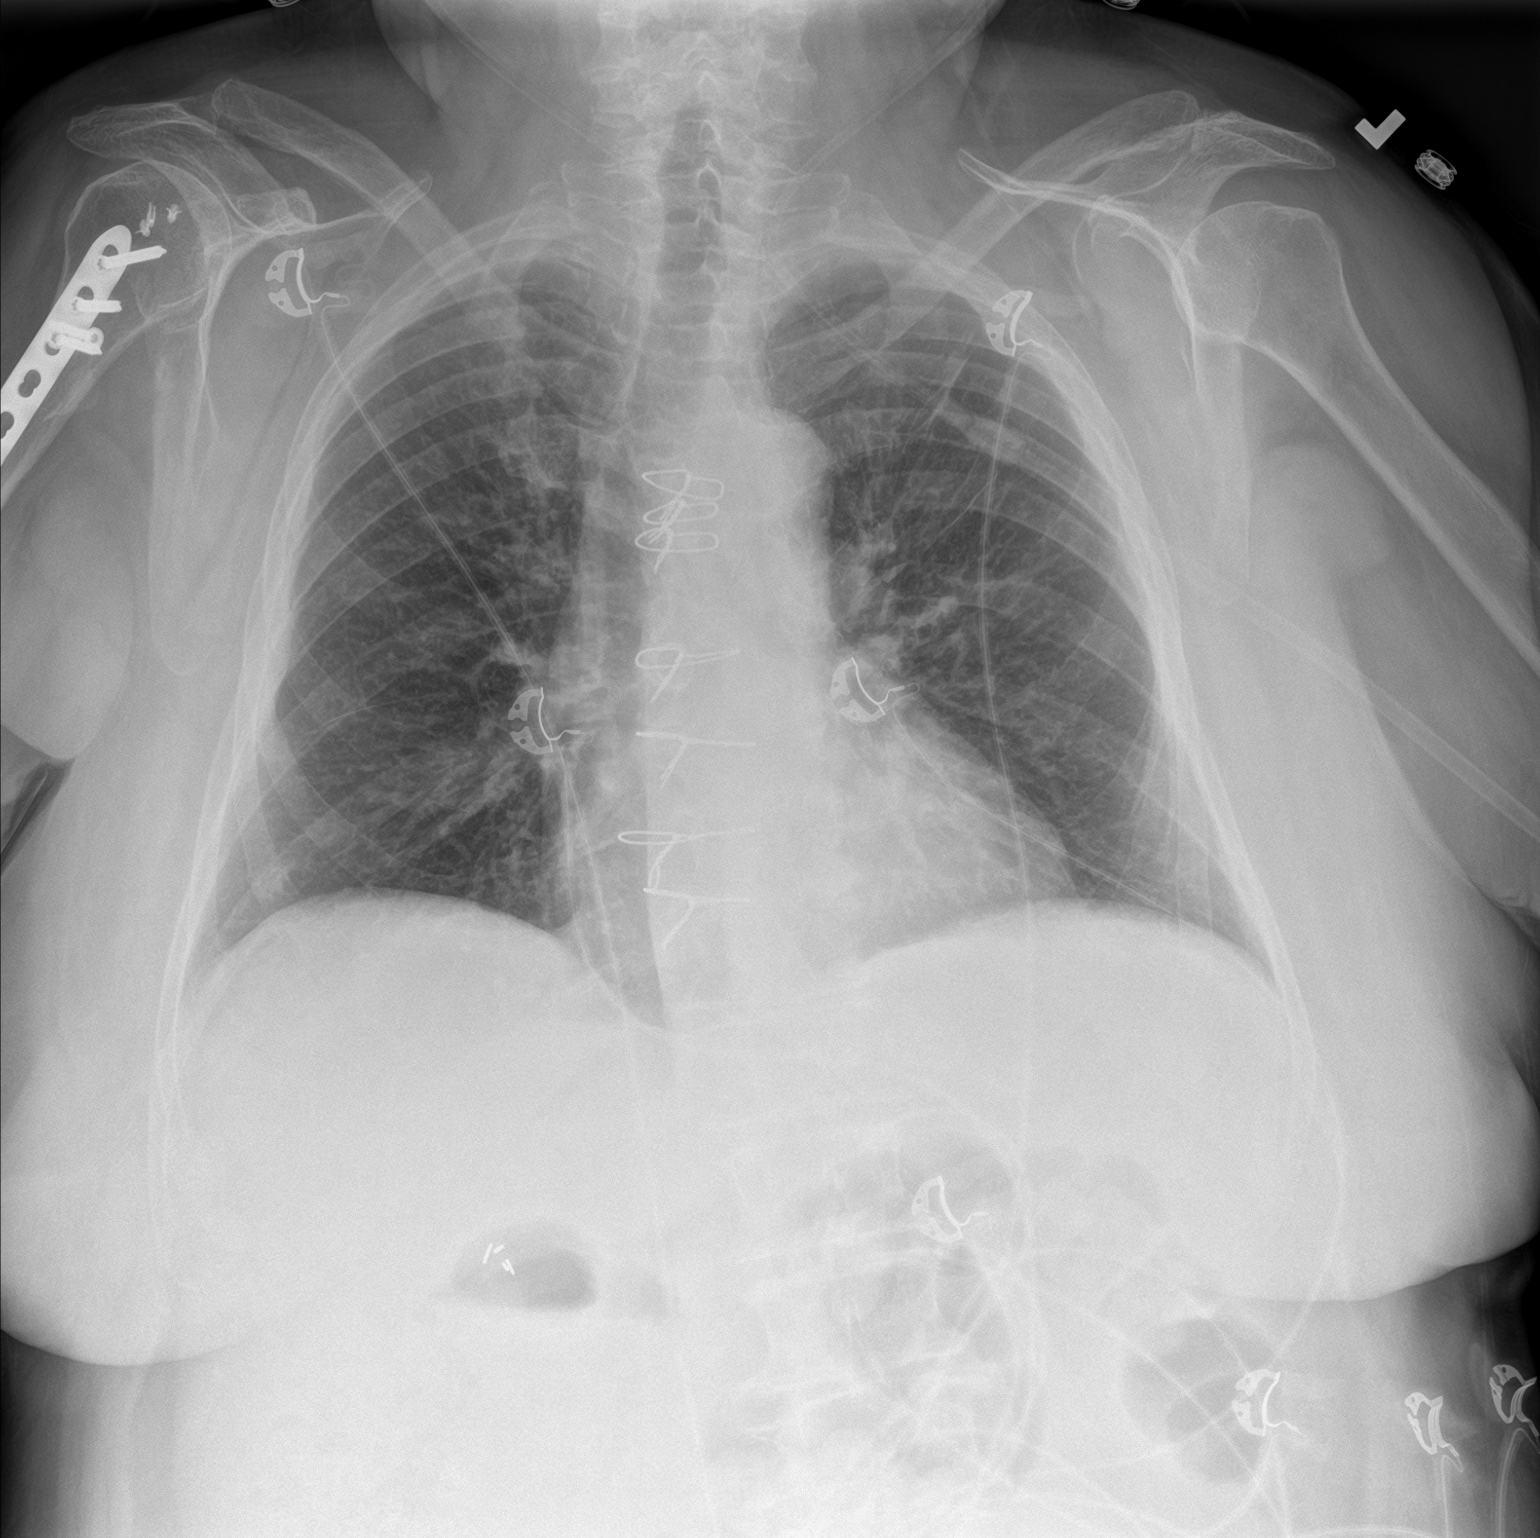

[chest lat]
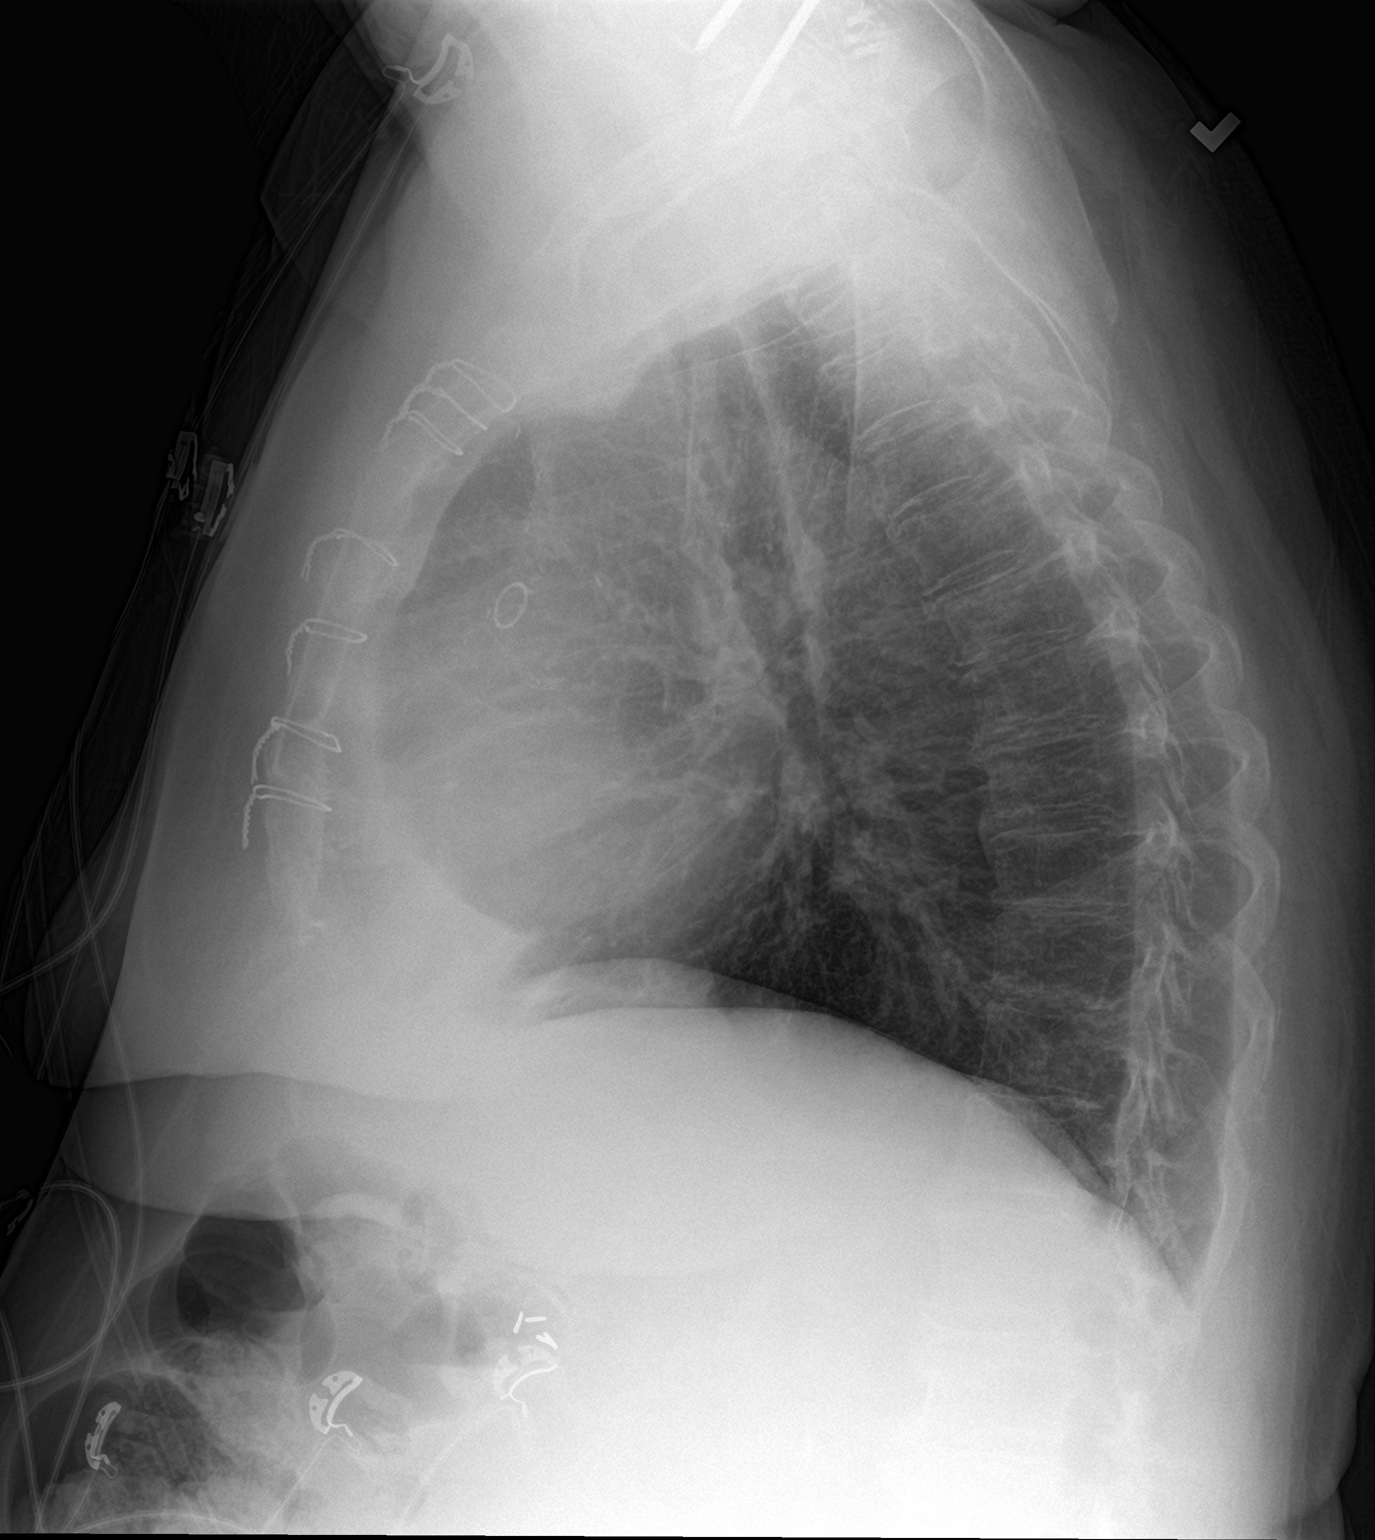

[2 of 2 positions shown; findings below may reference images not displayed]

FINDINGS: Stable normal cardiac silhouette given projection and technique.
Status post CABG with median sternotomy wires aligned and intact.
Right upper quadrant cholecystectomy clips. Aortic atherosclerosis
with calcification. Mild bronchitic changes. No focal consolidation.
No pleural effusion or pneumothorax. Right proximal femur rotator
cuff repair and humerus fixation hardware, partially visualized.
IMPRESSION: 1. Mild bronchitic changes.  No focal consolidation.
2.  Aortic Atherosclerosis ([id number]).

By: Yuya Thaler M.D.

## 2020-09-22 IMAGING — DX DG CHEST 2V
2 series · 2 of 2 positions shown · non-contrast
Comparison: September 13, 2018

CLINICAL DATA: Asthmatic bronchitis

EXAM:
CHEST - 2 VIEW

[chest pa]
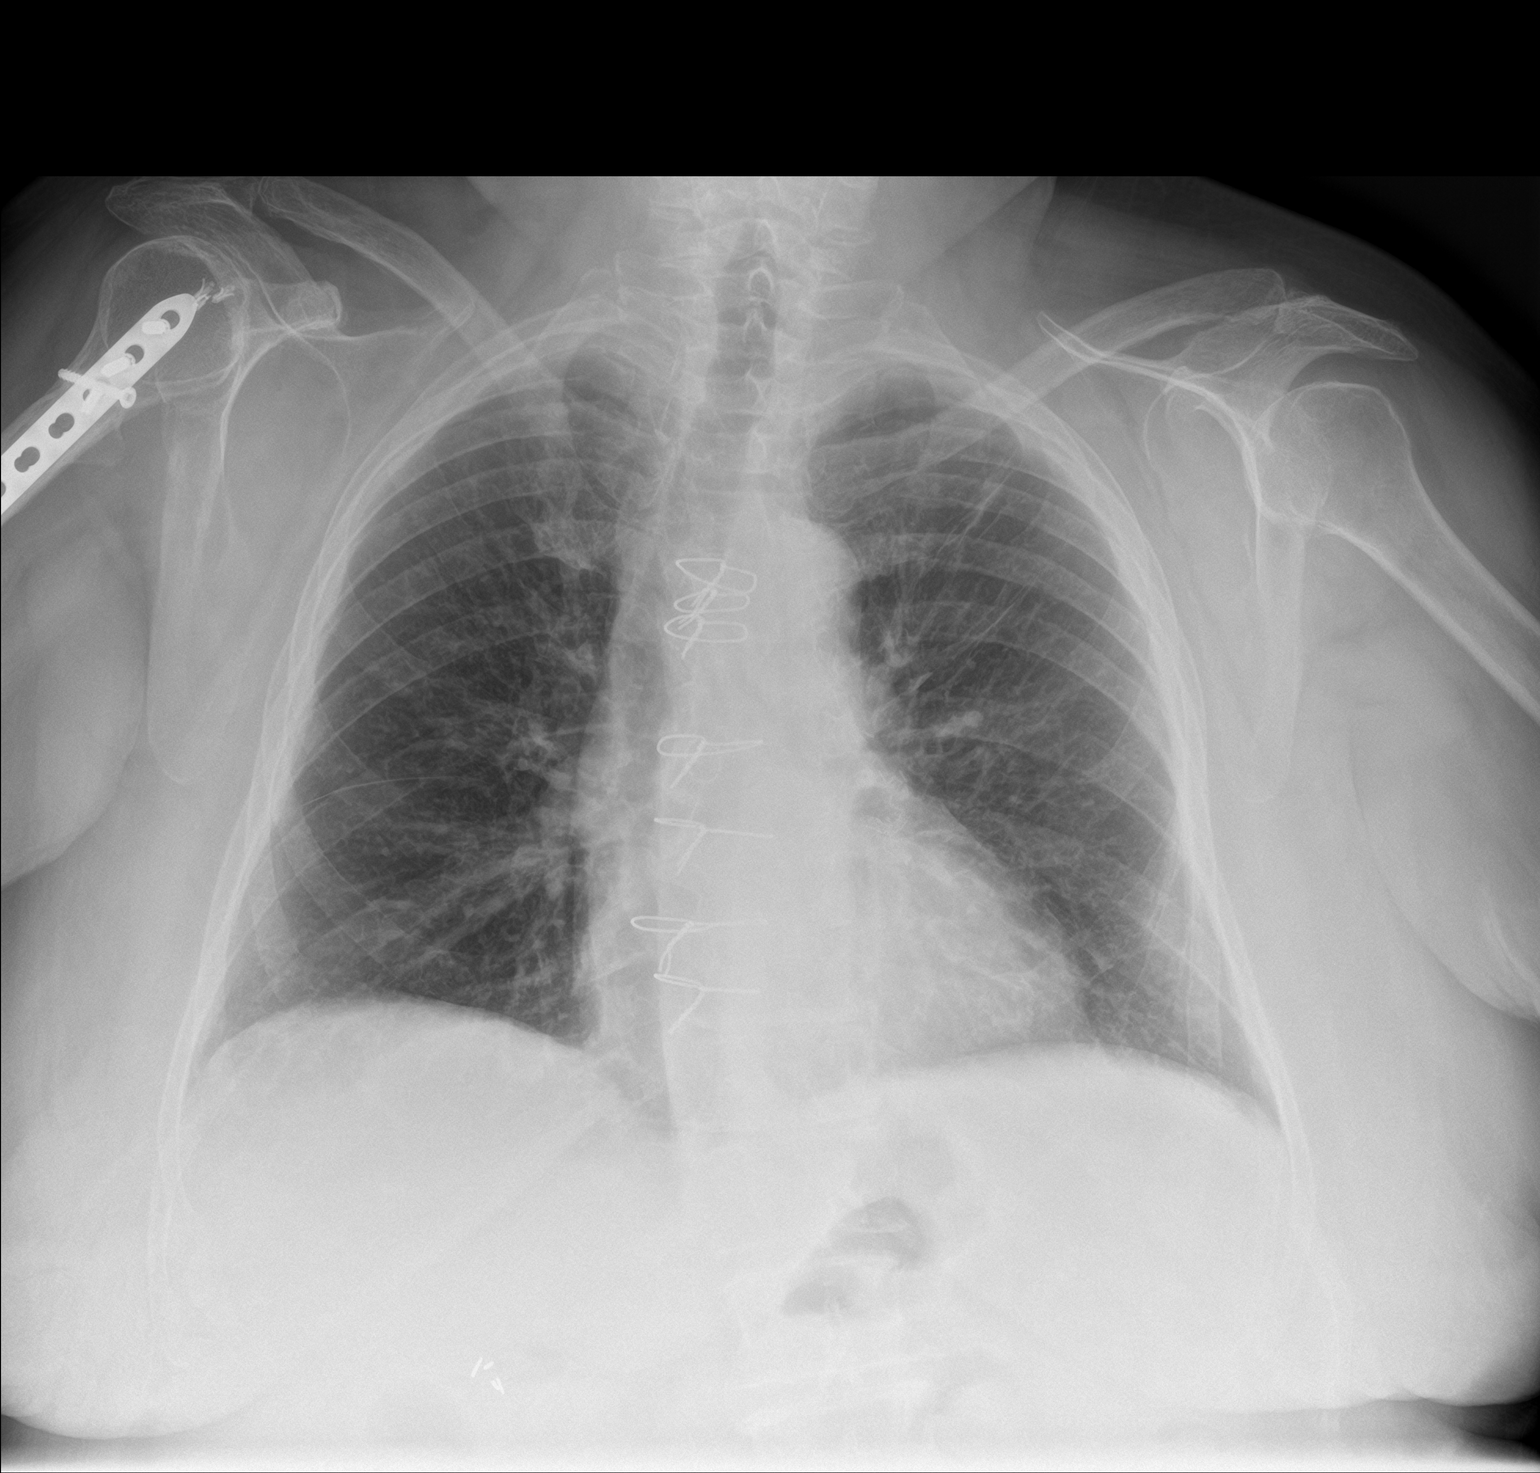

[chest lat]
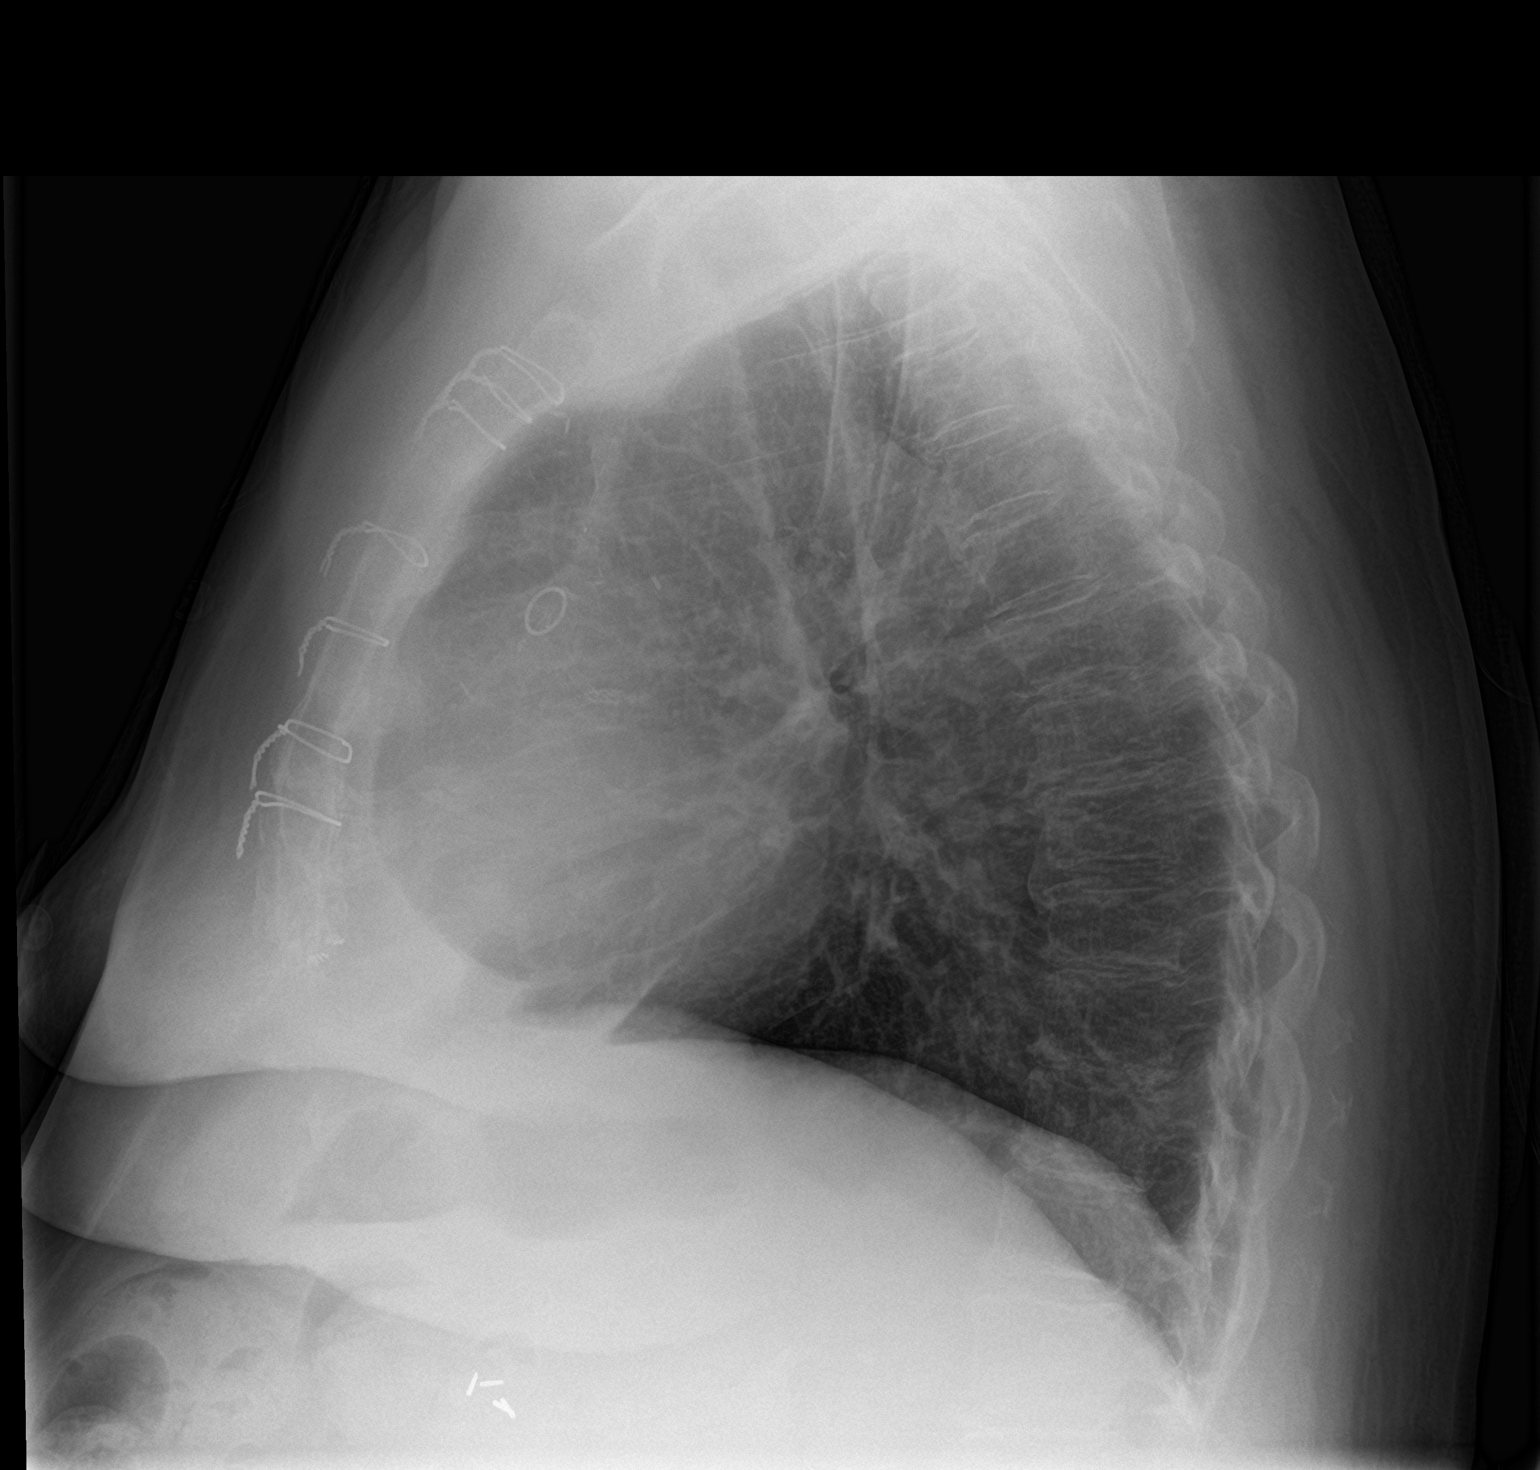

[2 of 2 positions shown; findings below may reference images not displayed]

FINDINGS: There is scarring in the left upper lobe region. There is no
appreciable edema or consolidation. Heart size and pulmonary
vascularity are normal. Patient is status post coronary artery
bypass grafting. No adenopathy. Postoperative changes noted in the
proximal right femur. Calcification is noted in the right carotid
artery.
IMPRESSION: Scarring left upper lobe, stable. No edema or consolidation. Stable
cardiac silhouette. Status post coronary artery bypass grafting.
There is a focal area of calcification noted in the right carotid
artery.

## 2020-10-30 IMAGING — US BILATERAL CAROTID DUPLEX ULTRASOUND
1 series · 13 of 24 positions shown · non-contrast
Comparison: 08/02/2016

CLINICAL DATA: Stroke for 12 hours

EXAM:
BILATERAL CAROTID DUPLEX ULTRASOUND
TECHNIQUE: Gray scale imaging, color Doppler and duplex ultrasound were
performed of bilateral carotid and vertebral arteries in the neck.

[Series 1: bilateral carotid duplex ultrasound · 0.06mm/px · 13 of 71 slices shown]
[im 1/71]
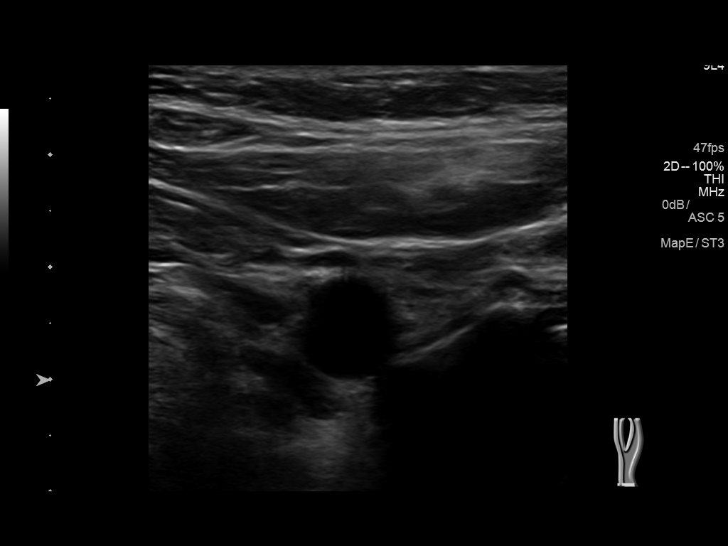
[im 7/71]
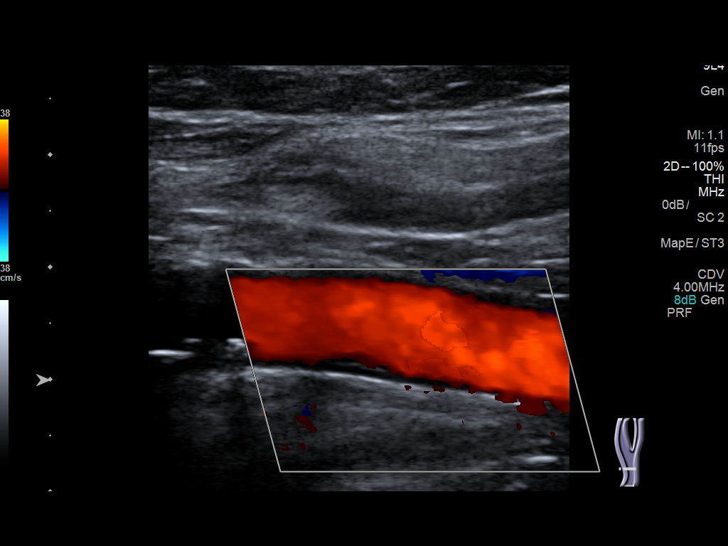
[im 13/71]
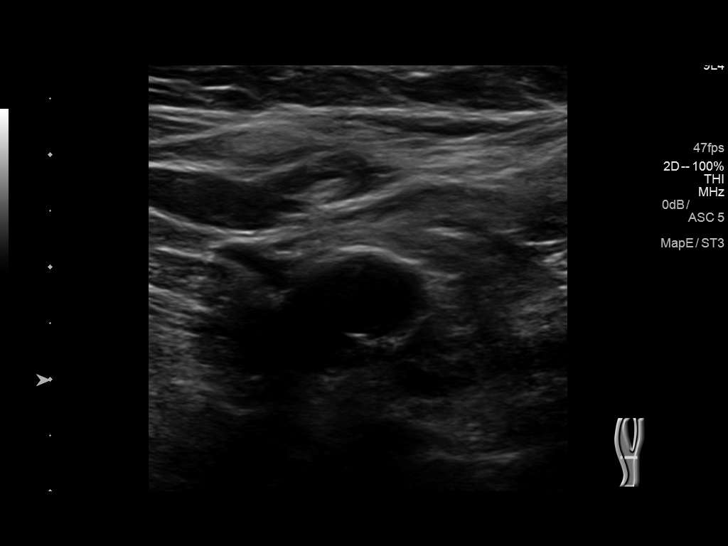
[im 19/71]
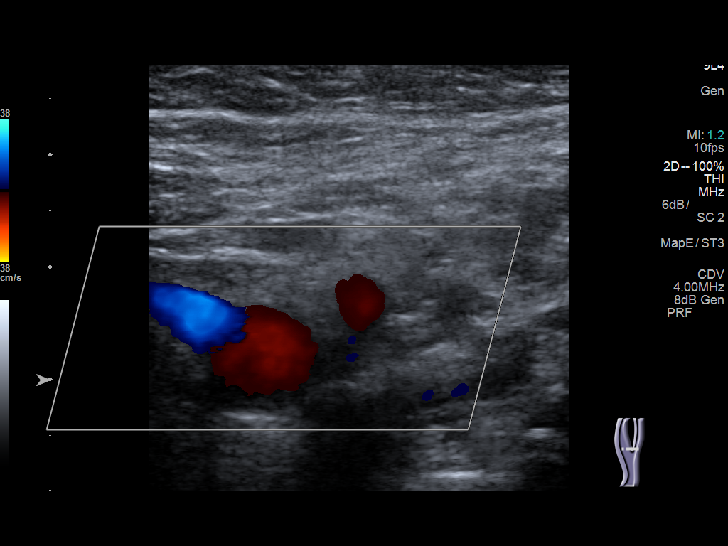
[im 25/71]
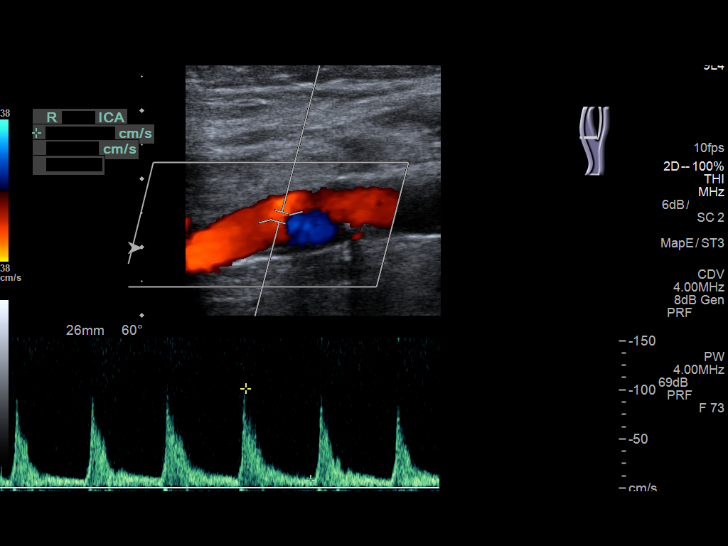
[im 31/71]
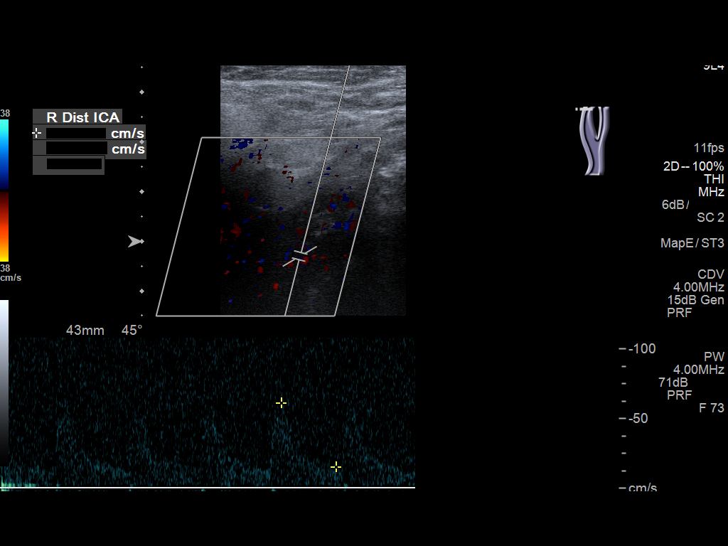
[im 37/71]
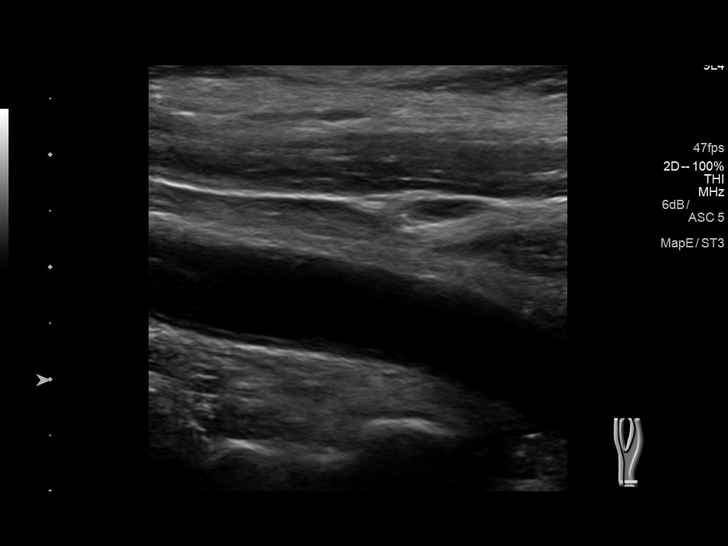
[im 40/71]
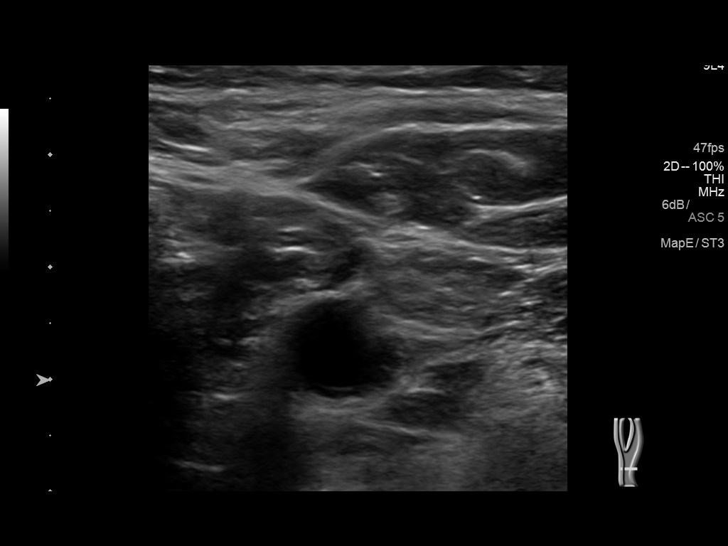
[im 46/71]
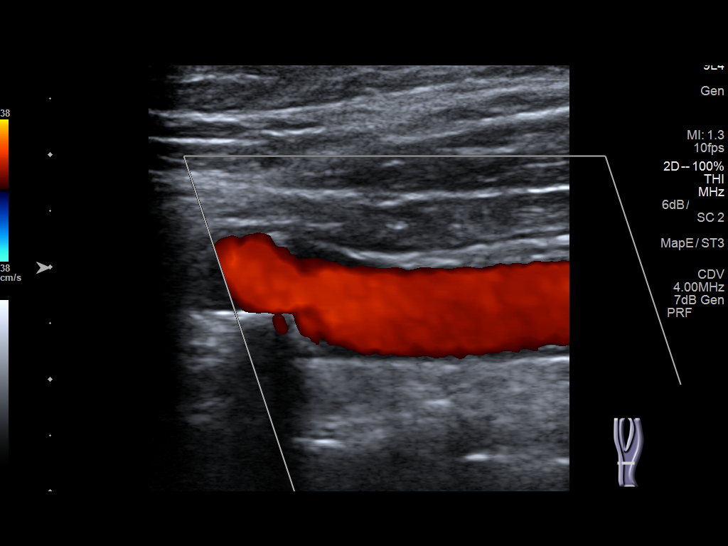
[im 52/71]
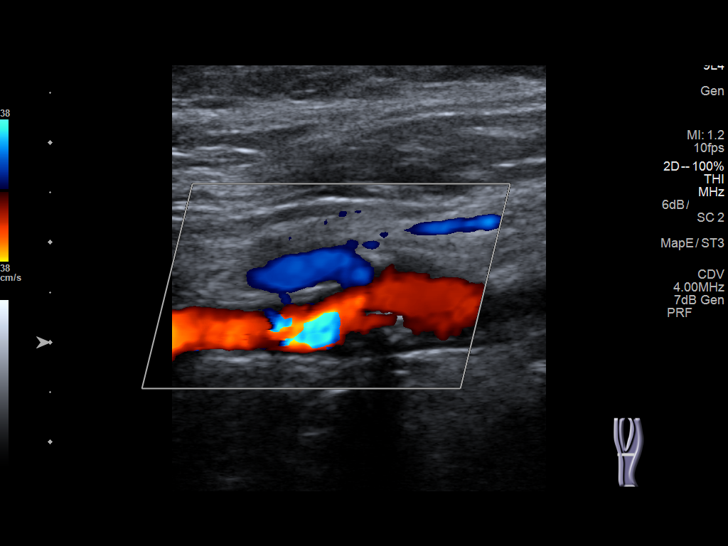
[im 58/71]
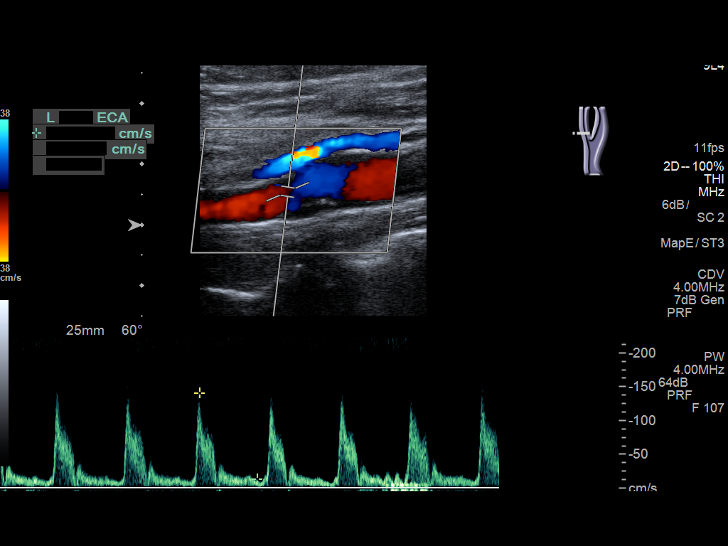
[im 64/71]
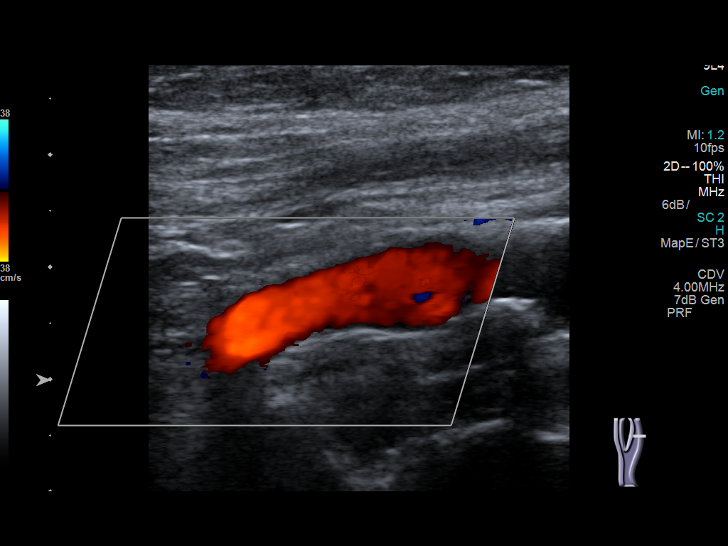
[im 71/71]
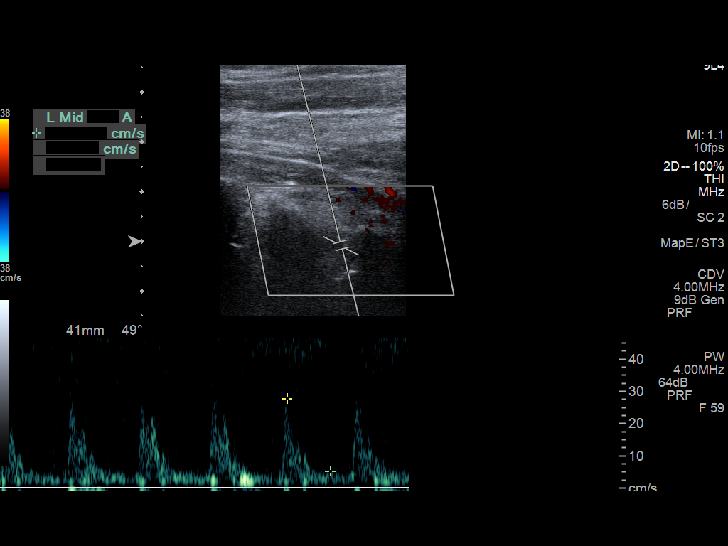

[13 of 24 positions shown; findings below may reference images not displayed]

FINDINGS: Criteria: Quantification of carotid stenosis is based on velocity
parameters that correlate the residual internal carotid diameter
with NASCET-based stenosis levels, using the diameter of the distal
internal carotid lumen as the denominator for stenosis measurement.

The following velocity measurements were obtained:

RIGHT

ICA: 101 cm/sec

CCA: 138 cm/sec

SYSTOLIC ICA/CCA RATIO:

ECA: 120 cm/sec

LEFT

ICA: 72 cm/sec

CCA: 67 cm/sec

SYSTOLIC ICA/CCA RATIO:

ECA: 141 cm/sec

RIGHT CAROTID ARTERY: Mild irregular calcified plaque in the bulb.
Low resistance internal carotid Doppler pattern is preserved.

RIGHT VERTEBRAL ARTERY:  Antegrade.

LEFT CAROTID ARTERY: Moderate predominately smooth calcified plaque
in the bulb. Low resistance internal carotid Doppler pattern is
preserved.

LEFT VERTEBRAL ARTERY:  Antegrade.
IMPRESSION: Less than 50% stenosis in the right and left internal carotid
arteries.

Antegrade vertebral arteries bilaterally.

## 2020-11-05 IMAGING — CT CT HEAD CODE STROKE
3 series · 15 of 47 positions shown, 18 images · non-contrast
Comparison: 08/28/2018 brain MRI. 08/27/2018 CT head and CTA head
neck.

CLINICAL DATA: Code stroke. Lost ability to speak at 1 p.m. Balance
difficulty today.

EXAM:
CT HEAD WITHOUT CONTRAST
TECHNIQUE: Contiguous axial images were obtained from the base of the skull
through the vertex without intravenous contrast.

[Series 2: head code stroke wo · axial · 0.44mm/px · z∈[+1447,+1582]mm · 9 of 33 slices shown, 12 images]
[im 3/33  brain]
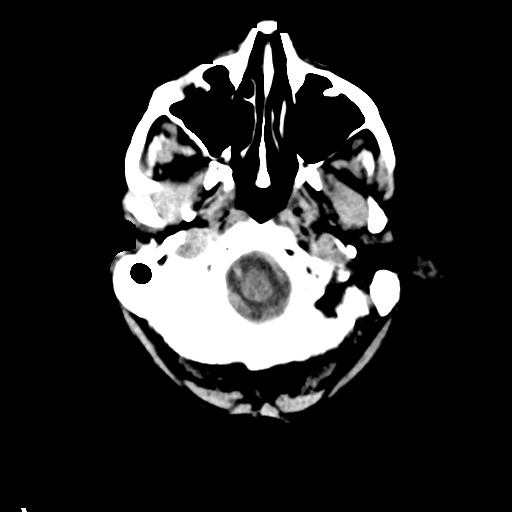
[im 3/33  bone]
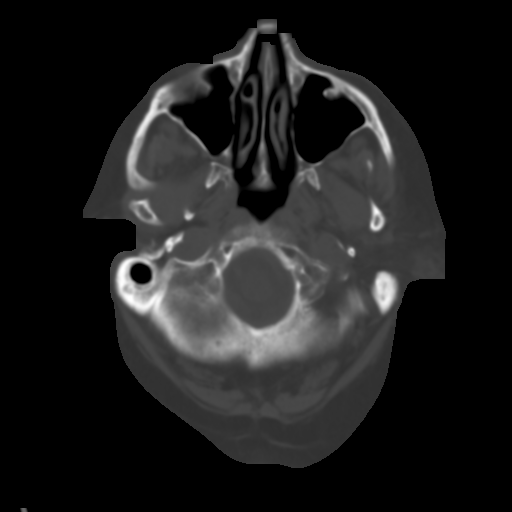
[im 6/33  brain]
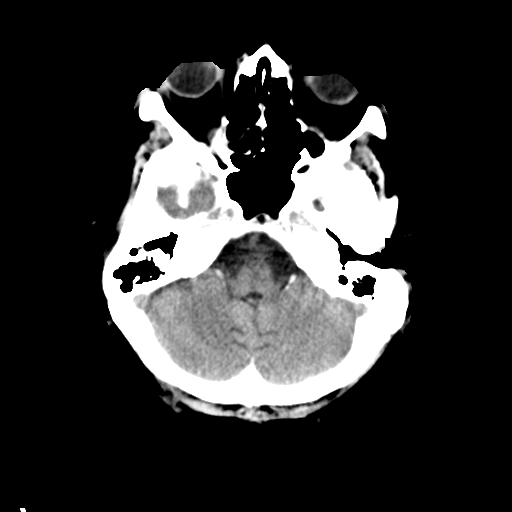
[im 9/33  brain]
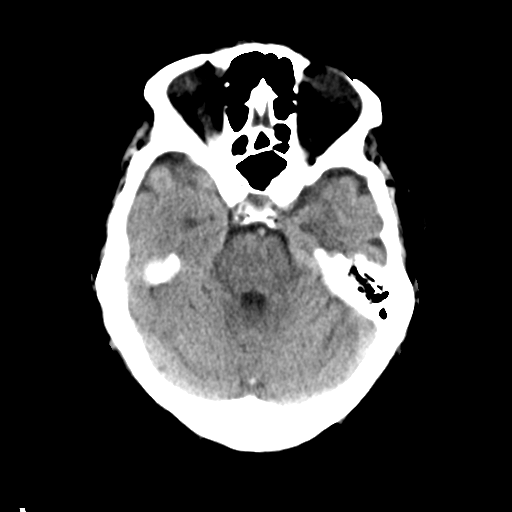
[im 13/33  brain]
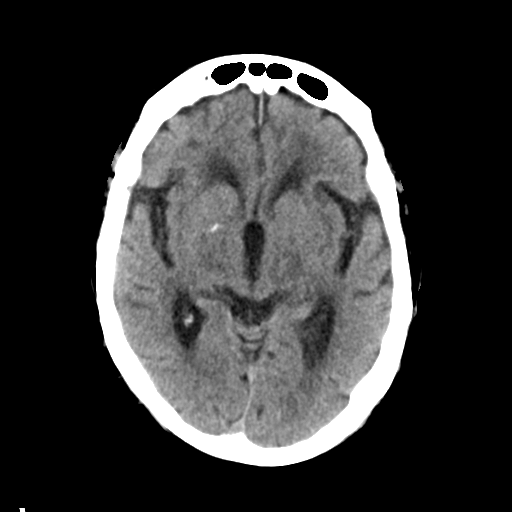
[im 17/33  brain]
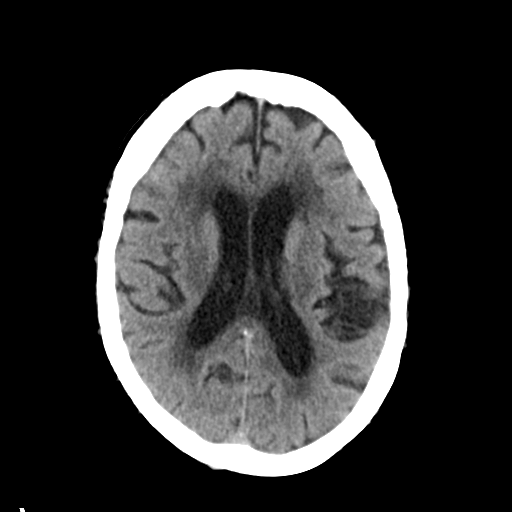
[im 17/33  bone]
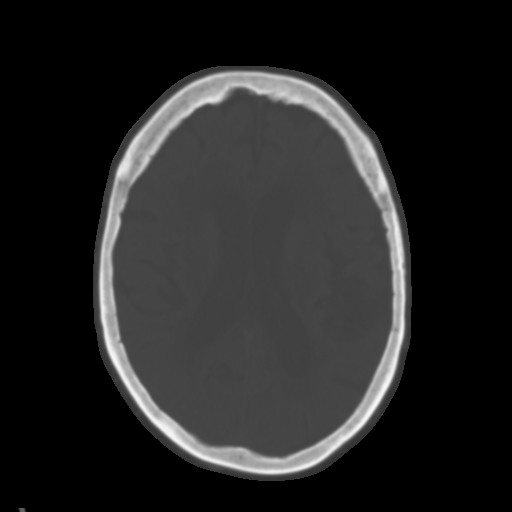
[im 20/33  brain]
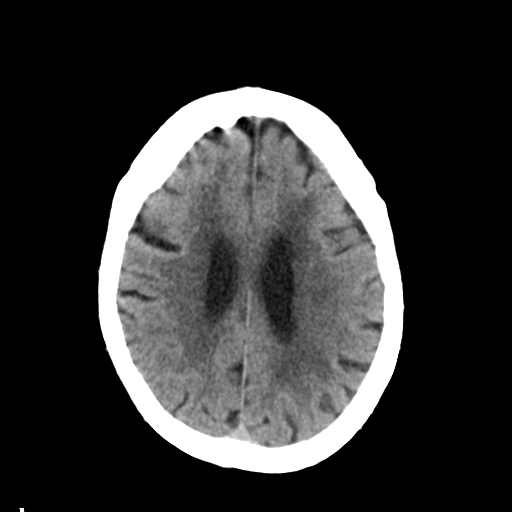
[im 24/33  brain]
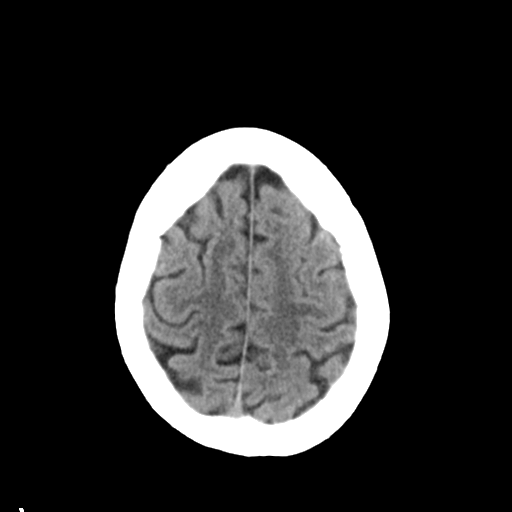
[im 27/33  brain]
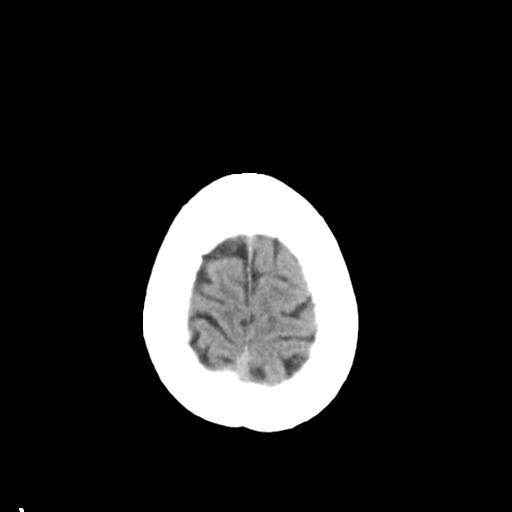
[im 30/33  brain]
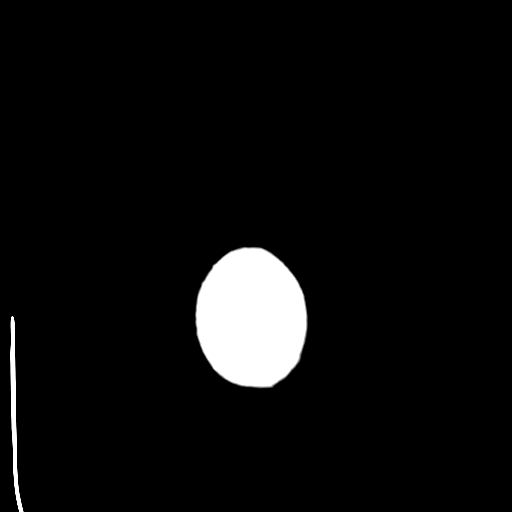
[im 30/33  bone]
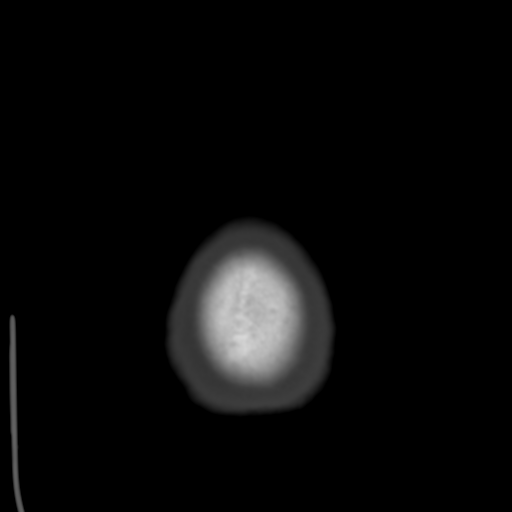

[Series 4: coronal soft tissue · coronal · 0.36mm/px · 3 of 71 slices shown]
[im 24/71  brain]
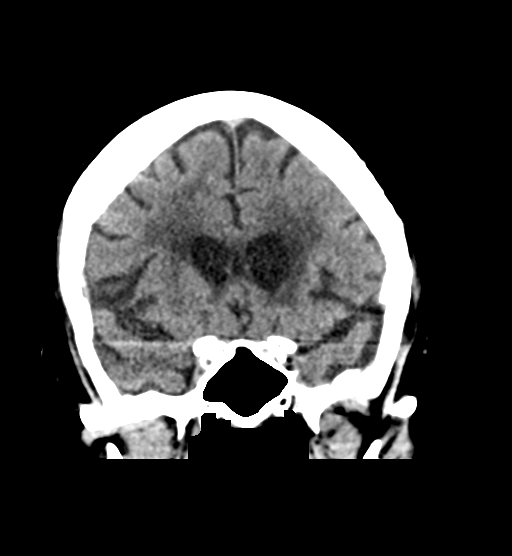
[im 32/71  brain]
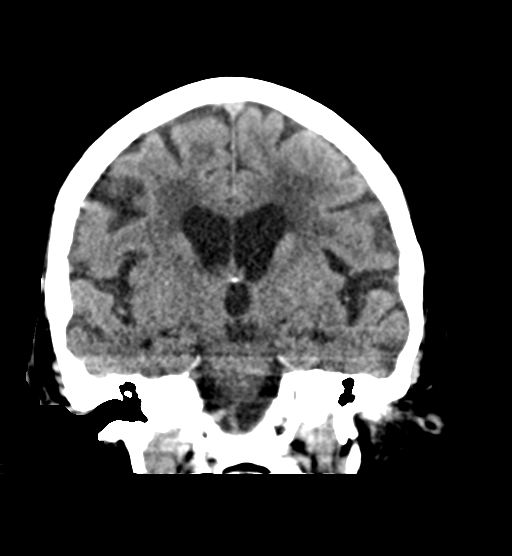
[im 39/71  brain]
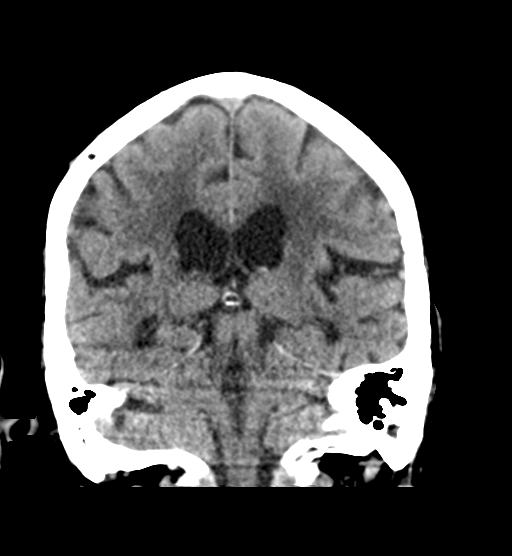

[Series 5: sagittal soft tissue · sagittal · 0.34mm/px · 3 of 67 slices shown]
[im 23/67  brain]
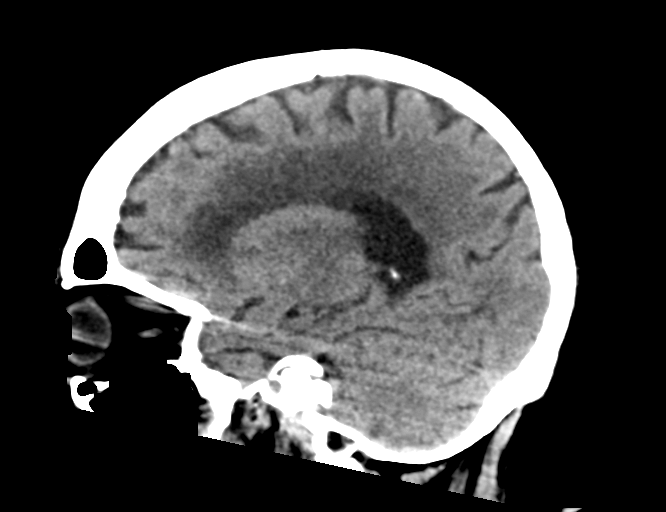
[im 34/67  brain]
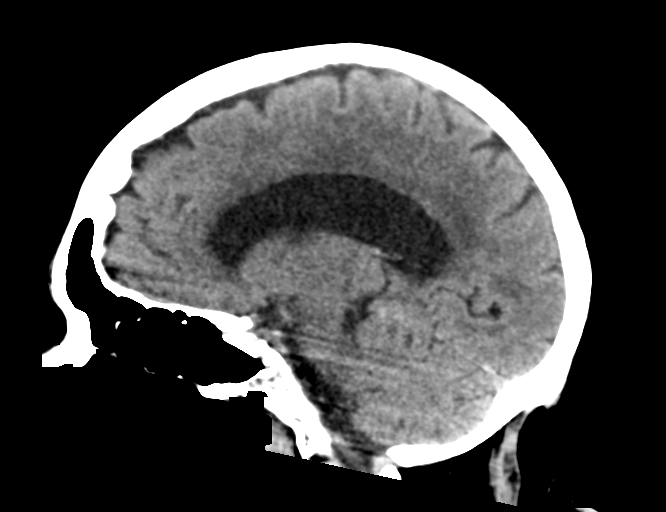
[im 45/67  brain]
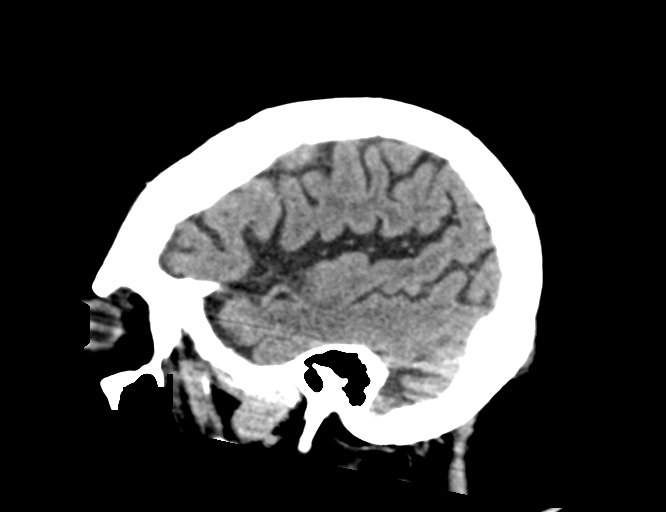

[15 of 47 positions shown; findings below may reference images not displayed]

FINDINGS: Brain: No evidence for acute infarction, hemorrhage, mass lesion,
hydrocephalus, or extra-axial fluid. Generalized atrophy.
Hypoattenuation of white matter, consistent with small vessel
disease.

Vascular: Calcification of the cavernous internal carotid arteries
consistent with cerebrovascular atherosclerotic disease. No signs of
intracranial large vessel occlusion.

Skull: Intact

Sinuses/Orbits: No acute finding.

Other: None

ASPECTS ([place]ber[place] Stroke Program Early CT Score)

- Ganglionic level infarction (caudate, lentiform nuclei, internal
capsule, insula, M1-M3 cortex): 7

- Supraganglionic infarction (M4-M6 cortex): 3

Total score (0-10 with 10 being normal): 10
IMPRESSION: 1. Atrophy and small vessel disease similar to priors. No acute
intracranial findings.
2. ASPECTS is 10
3. These results were called by telephone at the time of
interpretation on 11/01/2018 at [DATE] to Dr. NANINHA DDTANK via
Dr. Rikio Rez who verbally acknowledged these results.

## 2020-11-06 IMAGING — MR MR MRA HEAD W/O CM
10 of 12 series · 30 of 48 positions shown · non-contrast
Comparison: Prior CT from 11/01/2018

CLINICAL DATA: Initial evaluation for progressive right-sided
facial droop and weakness.

EXAM:
MRI HEAD WITHOUT CONTRAST
MRA HEAD WITHOUT CONTRAST
TECHNIQUE: Multiplanar, multiecho pulse sequences of the brain and surrounding
structures were obtained without intravenous contrast. Angiographic
images of the head were obtained using MRA technique without
contrast.

[Series 5: ax dwi_tracew · axial · 4.0mm · 0.88mm/px · z∈[-56,+82]mm · 4 of 72 slices shown]
[im 1/72]
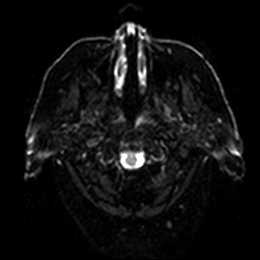
[im 24/72]
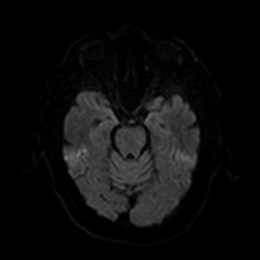
[im 48/72]
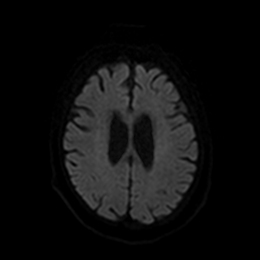
[im 72/72]
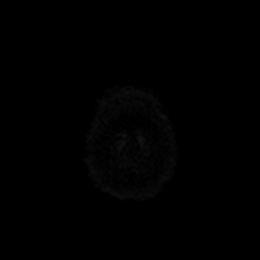

[Series 6: ax dwi_adc · axial · 4.0mm · 0.88mm/px · z∈[-56,+82]mm · 3 of 36 slices shown]
[im 1/36]
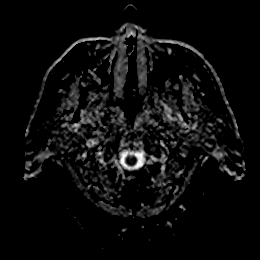
[im 18/36]
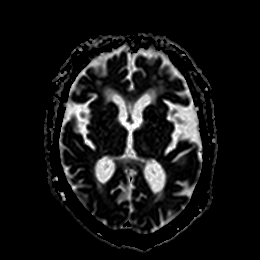
[im 36/36]
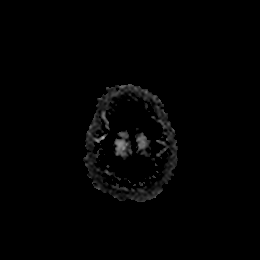

[Series 7: cor dwi_tracew · coronal · 4.0mm · 0.88mm/px · 5 of 64 slices shown]
[im 1/64]
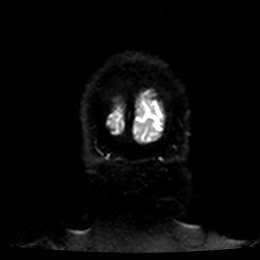
[im 16/64]
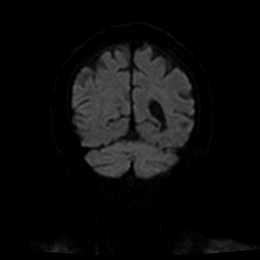
[im 32/64]
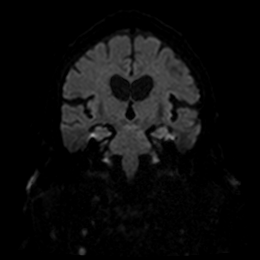
[im 48/64]
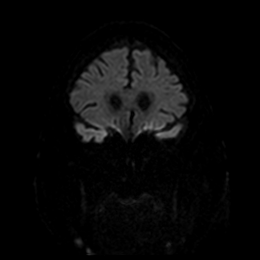
[im 64/64]
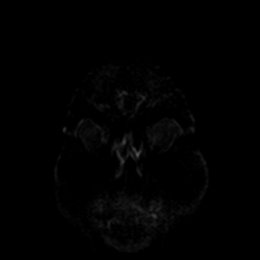

[Series 8: cor dwi_adc · coronal · 4.0mm · 0.88mm/px · 2 of 32 slices shown]
[im 1/32]
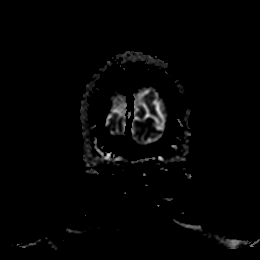
[im 32/32]
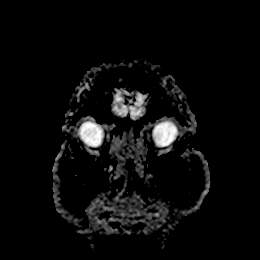

[Series 13: T1 · sagittal · 5.0mm · 0.75mm/px · 2 of 23 slices shown]
[im 1/23]
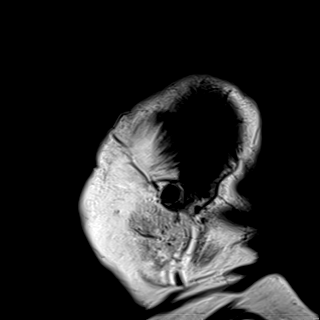
[im 23/23]
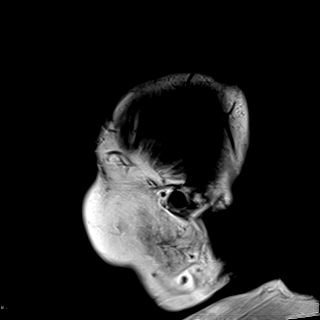

[Series 14: T2 · axial · 5.0mm · 0.72mm/px · z∈[-57,+79]mm · 2 of 24 slices shown (1 of 2)]
[im 1/24]
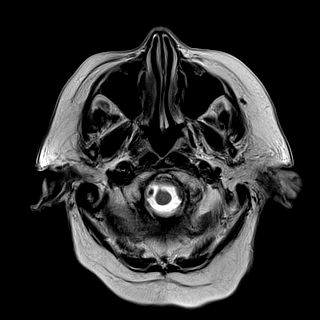
[im 24/24]
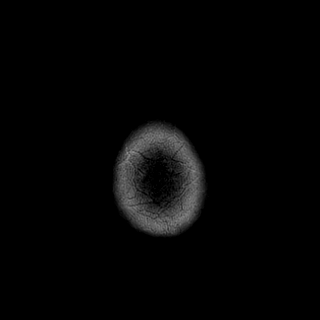

[Series 15: FLAIR · axial · 5.0mm · 0.45mm/px · z∈[-58,+78]mm · 2 of 24 slices shown]
[im 1/24]
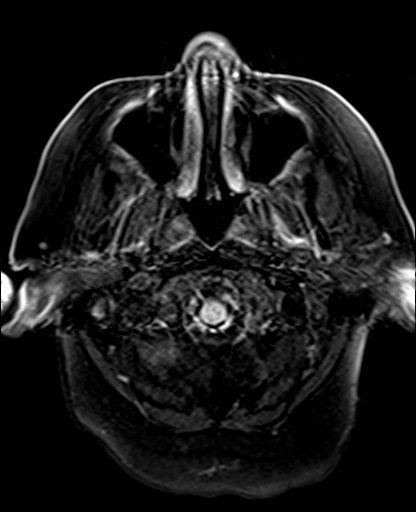
[im 24/24]
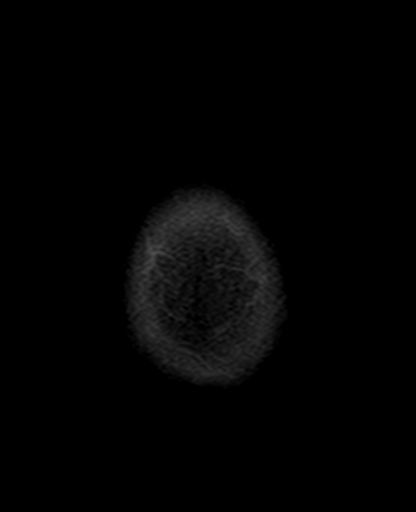

[Series 16: swi_images · axial · 3.0mm · 0.90mm/px · z∈[-80,+95]mm · 4 of 60 slices shown]
[im 1/60]
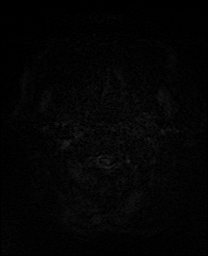
[im 20/60]
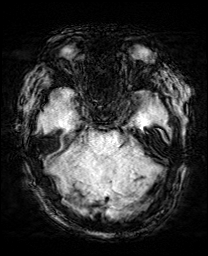
[im 40/60]
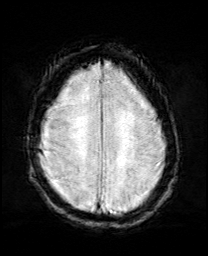
[im 60/60]
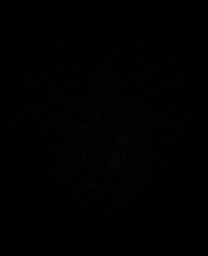

[Series 17: mip_images(sw) · axial · 24.0mm · 0.90mm/px · z∈[-69,+85]mm · 4 of 53 slices shown]
[im 1/53]
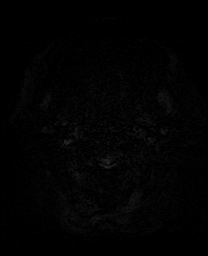
[im 18/53]
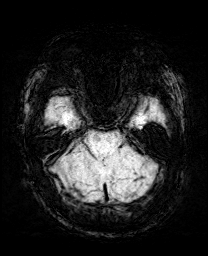
[im 35/53]
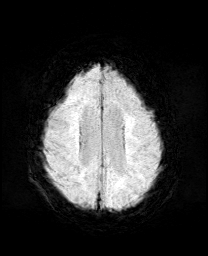
[im 53/53]
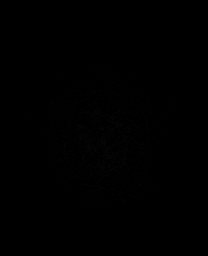

[Series 20: T2 · coronal · 5.0mm · 0.34mm/px · 2 of 29 slices shown (2 of 2)]
[im 1/29]
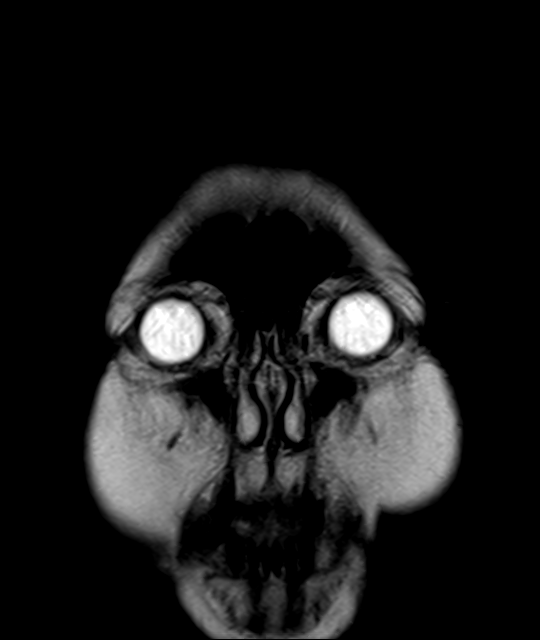
[im 29/29]
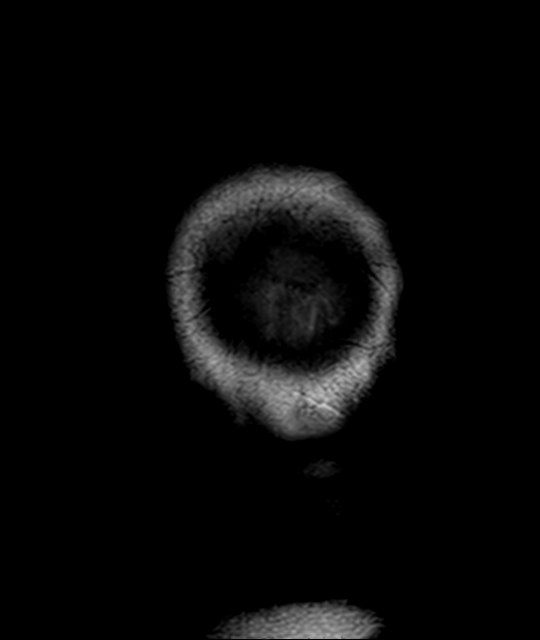

[30 of 48 positions shown; findings below may reference images not displayed]

FINDINGS: MRI HEAD FINDINGS

Brain: Examination mildly degraded by motion artifact.

Mild diffuse prominence of CSF containing spaces compatible
generalized age-related cerebral atrophy. Patchy and confluent
T2/FLAIR hyperintensity within the periventricular deep white matter
both cerebral hemispheres most consistent with chronic small vessel
ischemic disease, moderate nature. Superimposed remote lacunar
infarcts present within the bilateral thalami. Few tiny remote left
cerebellar infarcts noted.

No abnormal foci of restricted diffusion to suggest acute or
subacute ischemia. Gray-white matter differentiation maintained. No
encephalomalacia to suggest chronic cortical infarction. No evidence
for acute or chronic intracranial hemorrhage.

No mass lesion, midline shift or mass effect. No hydrocephalus. No
extra-axial fluid collection. Pituitary gland within normal limits.

Vascular: Major intracranial vascular flow voids maintained.

Skull and upper cervical spine: Craniocervical junction normal. Bone
marrow signal intensity within normal limits. Scalp soft tissues
unremarkable.

Sinuses/Orbits: Patient status post ocular lens replacement
bilaterally. Paranasal sinuses are largely clear. Small left mastoid
effusion noted, of doubtful significance.

Other: None.

MRA HEAD FINDINGS

ANTERIOR CIRCULATION:

Examination mildly degraded by motion artifact.

Distal cervical segments of the internal carotid arteries are patent
with antegrade flow. Petrous segments widely patent bilaterally.
Moderate intracranial atheromatous irregularity throughout the
cavernous/supraclinoid ICAs with up to moderate approximate 50% of
stenosis on the right. There is a focal severe stenosis at the
anterior genu of the cavernous left ICA (series 1010, image 5).
Internal carotid arteries are patent to the termini. A1 segments
patent bilaterally. Right A1 hypoplastic, accounting for the
slightly diminutive right ICA is compared to the left. Superimposed
short-segment severe stenosis at the mid right A1 segment (series
1010, image 10). Grossly normal anterior communicating artery.
Anterior cerebral arteries grossly patent to their distal aspects
without high-grade stenosis, although motion artifact limits
evaluation proximally.

Diffuse atheromatous irregularity throughout the M1 segments
bilaterally without high-grade stenosis. Short-segment mild proximal
right M1 stenosis (series 7732, image 10). Normal MCA bifurcations.
Short-segment severe proximal left M2 stenosis, inferior division
(series 1010, image 8). Proximal right M2 branches limited in
assessment by motion artifact. Extensive small vessel atheromatous
irregularity throughout the distal MCA branches bilaterally.

POSTERIOR CIRCULATION:

Vertebral arteries patent to the vertebrobasilar junction without
high-grade stenosis. Vertebral arteries largely co dominant.
Posterior inferior cerebral arteries not seen on this examination.
Basilar widely patent to its distal aspect without stenosis.
Superior cerebral arteries patent bilaterally. Left PCA supplied via
the basilar. Predominant fetal type origin of the right PCA. Severe
multifocal atheromatous irregularity throughout the right PCA, which
does remain patent to its distal aspect. Short-segment moderate to
severe distal left P2 stenosis. Extensive small vessel atheromatous
irregularity throughout the distal PCA branches bilaterally.

No appreciable aneurysm.
IMPRESSION: MRI HEAD IMPRESSION:

1. No acute intracranial abnormality.
2. Age-related cerebral atrophy with moderate chronic small vessel
ischemic disease. Associated small remote lacunar infarcts involving
the bilateral thalami and left cerebellum.

MRA HEAD IMPRESSION:

1. Negative intracranial MRA for large vessel occlusion.
2. Moderate to advanced atherosclerotic change throughout the
intracranial circulation. Notable findings include short-segment
severe cavernous left ICA stenosis, severe right A1 stenosis, severe
left M2 stenosis, with moderate to severe multifocal bilateral PCA
stenoses.
3. Extensive distal small vessel atheromatous irregularity.

## 2020-12-17 IMAGING — CR DG CHEST 1V PORT
1 series · 1 of 1 positions shown · non-contrast
Comparison: Chest radiographs September 18, 2018

CLINICAL DATA: Cough, congestion and wheezing beginning yesterday.
History of COPD.

EXAM:
PORTABLE CHEST 1 VIEW

[portable]
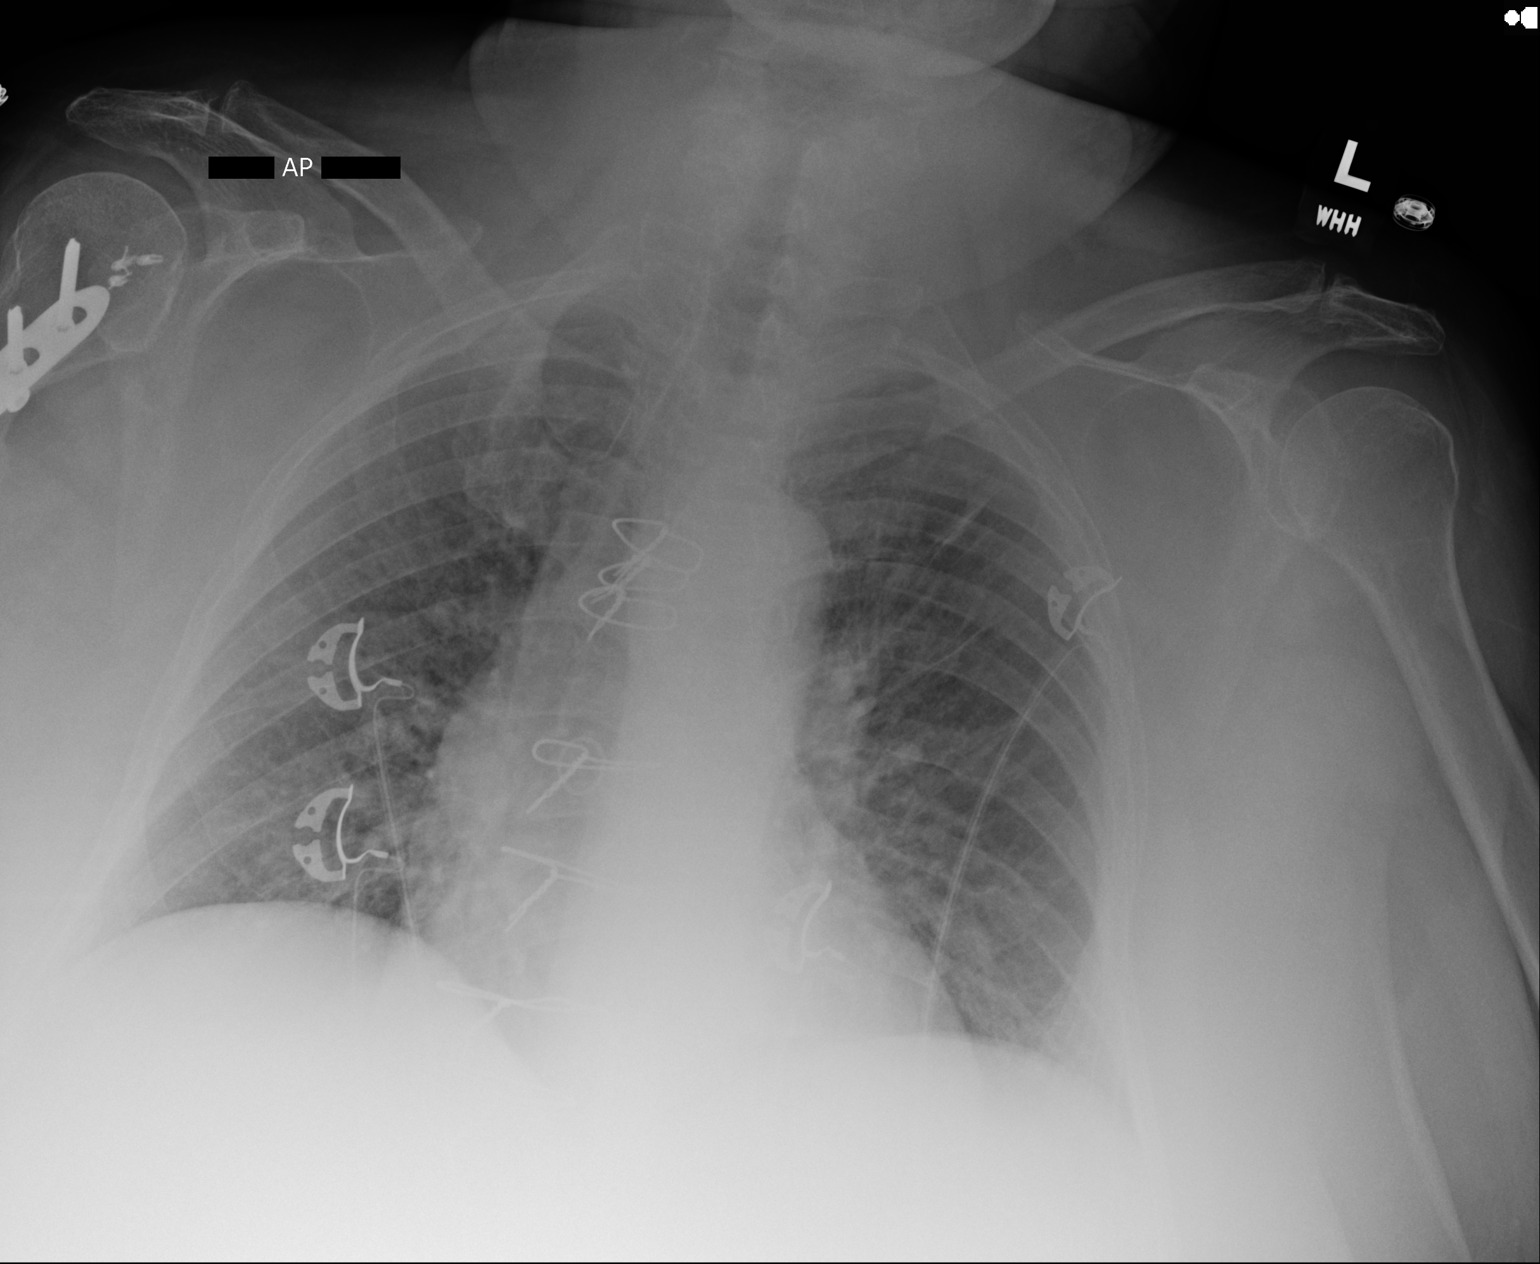

[1 of 1 positions shown; findings below may reference images not displayed]

FINDINGS: Cardiac silhouette is upper limits of normal size, mediastinal
silhouette is not suspicious. Status post median sternotomy.
Pulmonary vascular congestion without pleural effusion or focal
consolidation. LEFT upper lobe scarring. No pneumothorax. RIGHT
humeral head suture anchors and plate and screw fixation. Soft
tissue planes are non suspicious.
IMPRESSION: 1. Borderline cardiomegaly and pulmonary vascular congestion.

## 2021-01-07 IMAGING — CT CT HEAD W/O CM
3 series · 16 of 47 positions shown, 19 images · non-contrast
Comparison: MRI brain 11/02/2018. CT head 11/01/2018

CLINICAL DATA: Dizziness and off balance worsened yesterday.

EXAM:
CT HEAD WITHOUT CONTRAST
TECHNIQUE: Contiguous axial images were obtained from the base of the skull
through the vertex without intravenous contrast.

[Series 2: head trauma wo · axial · 0.45mm/px · z∈[-18,+117]mm · 10 of 33 slices shown, 13 images]
[im 3/33  brain]
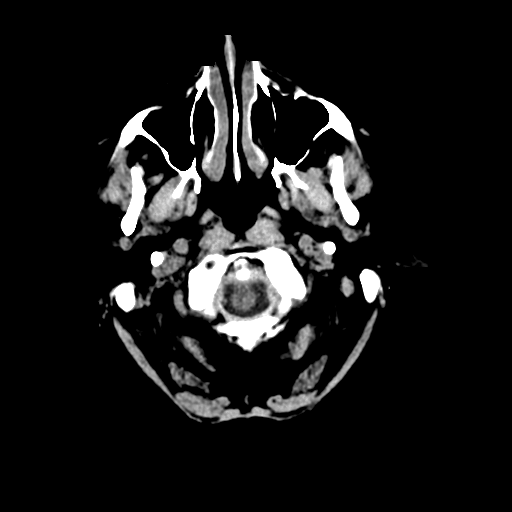
[im 3/33  bone]
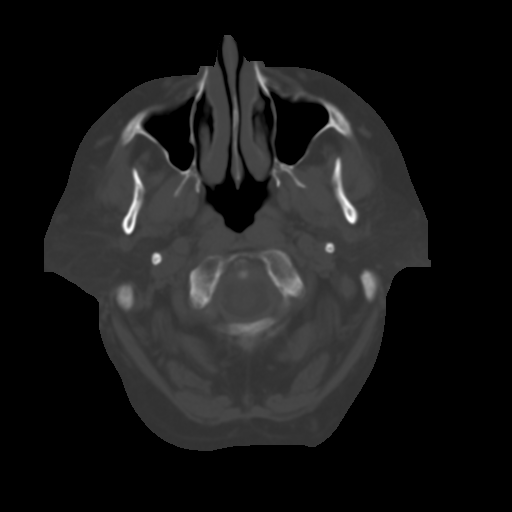
[im 6/33  brain]
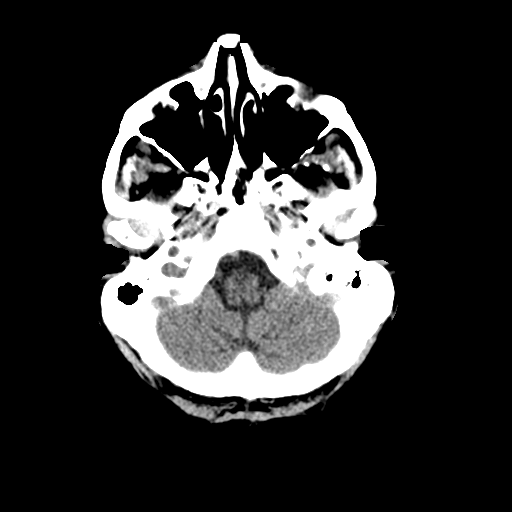
[im 9/33  brain]
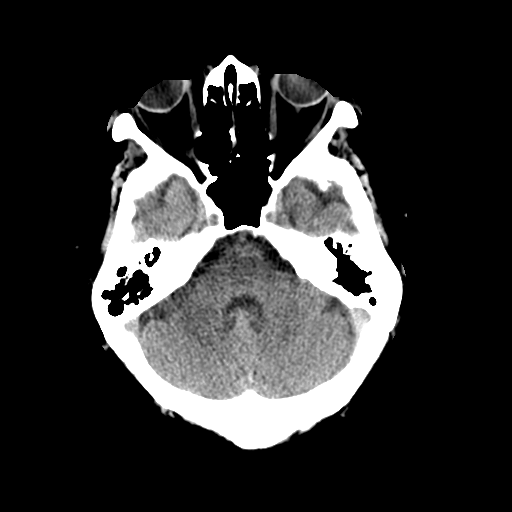
[im 12/33  brain]
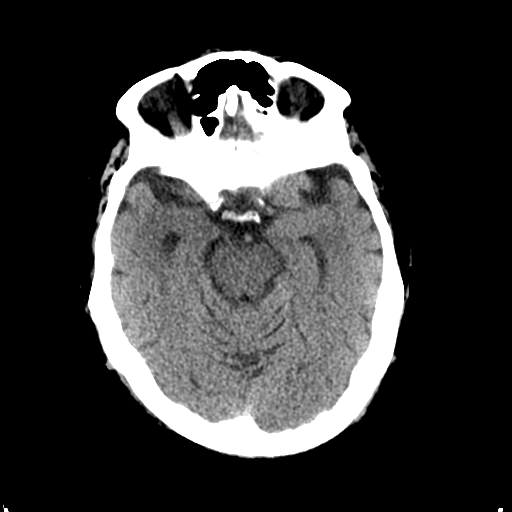
[im 15/33  brain]
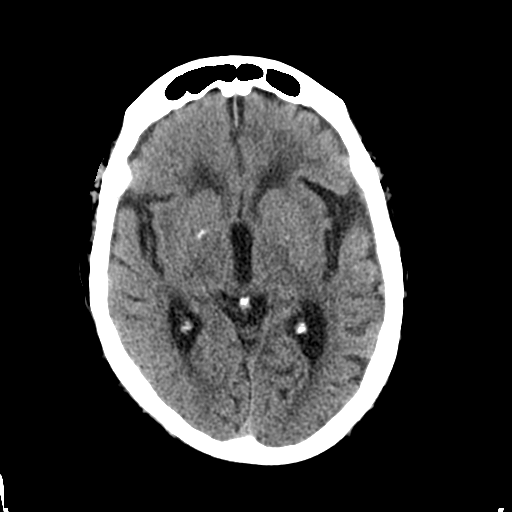
[im 15/33  bone]
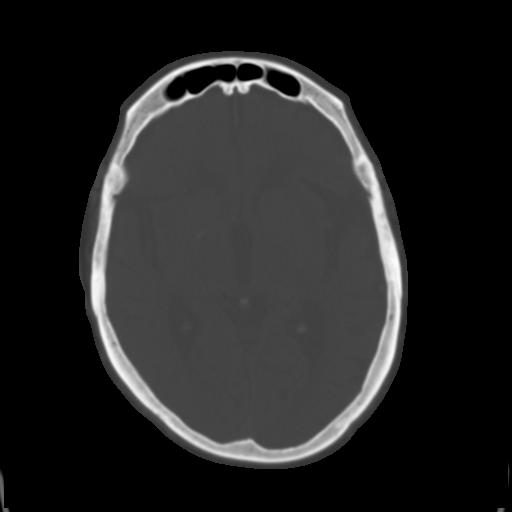
[im 18/33  brain]
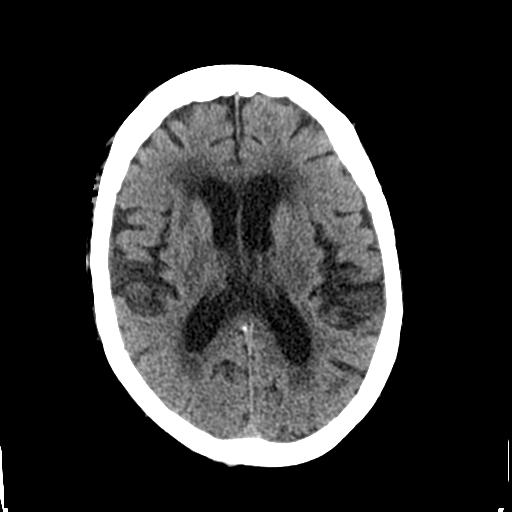
[im 21/33  brain]
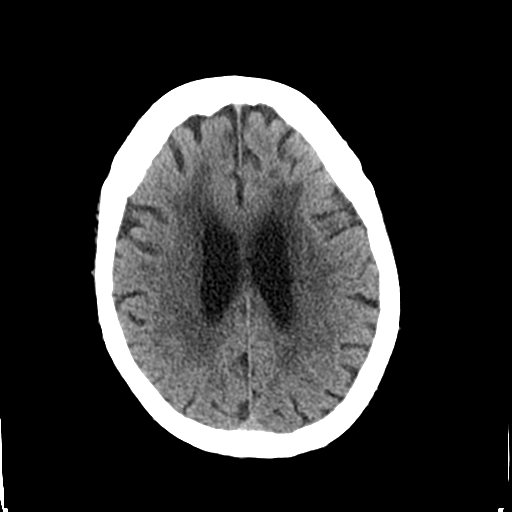
[im 25/33  brain]
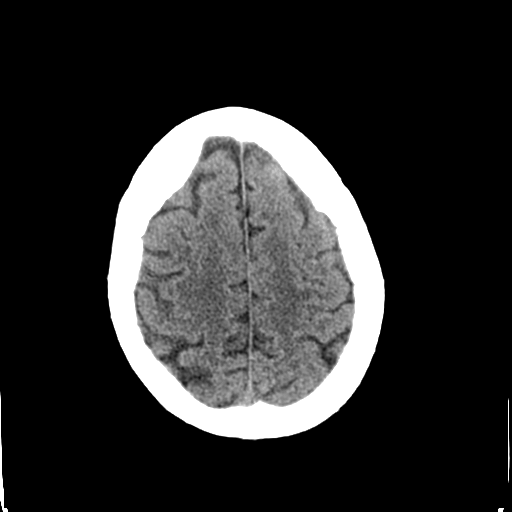
[im 27/33  brain]
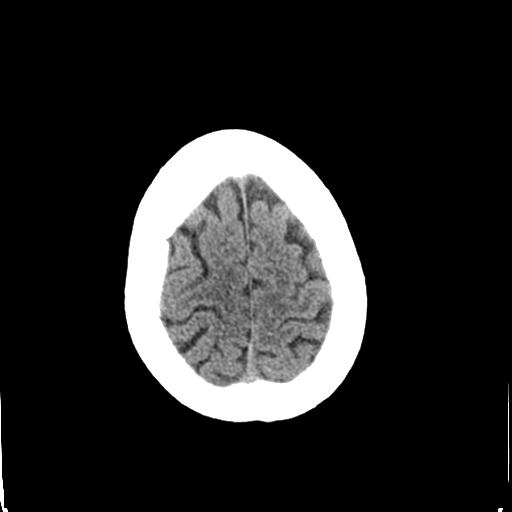
[im 27/33  bone]
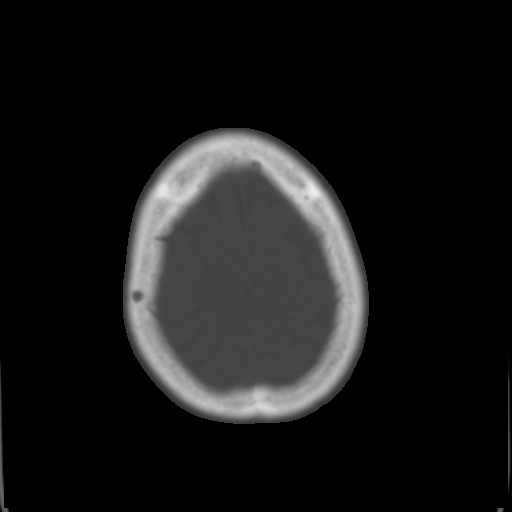
[im 30/33  brain]
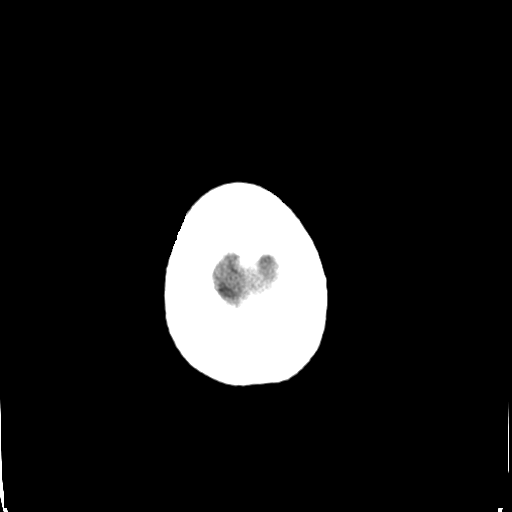

[Series 4: coronal soft tissue · coronal · 0.34mm/px · 3 of 74 slices shown]
[im 25/74  brain]
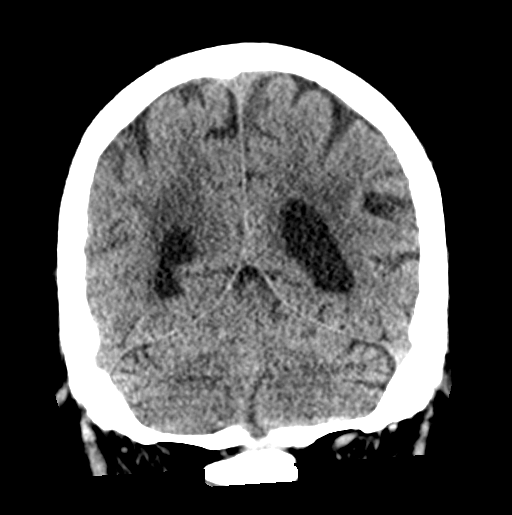
[im 33/74  brain]
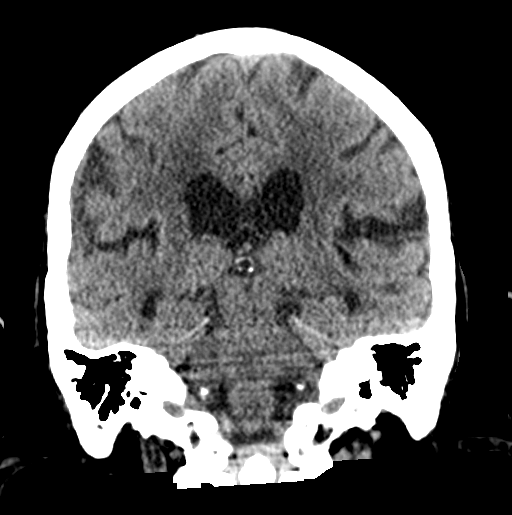
[im 41/74  brain]
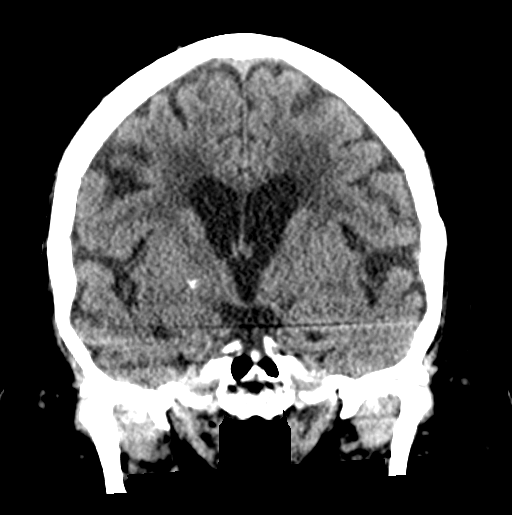

[Series 5: sagittal soft tissue · sagittal · 0.37mm/px · 3 of 63 slices shown]
[im 21/63  brain]
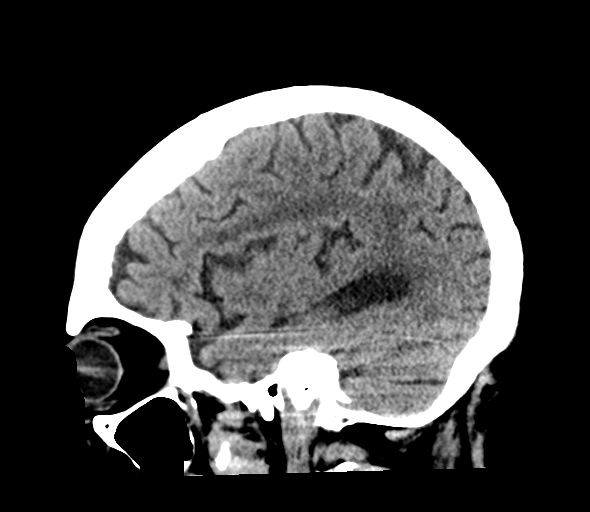
[im 32/63  brain]
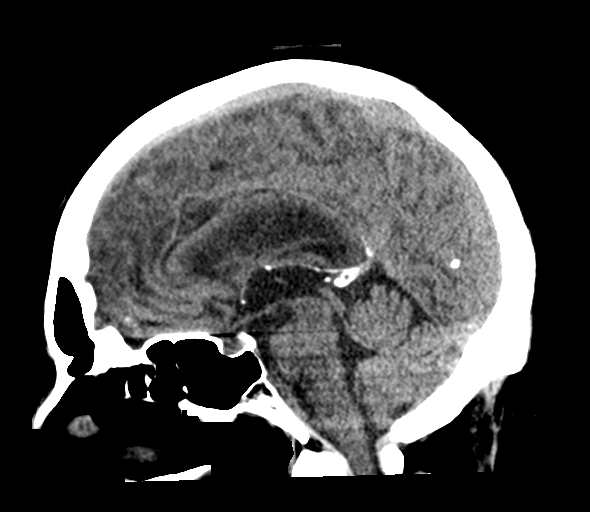
[im 42/63  brain]
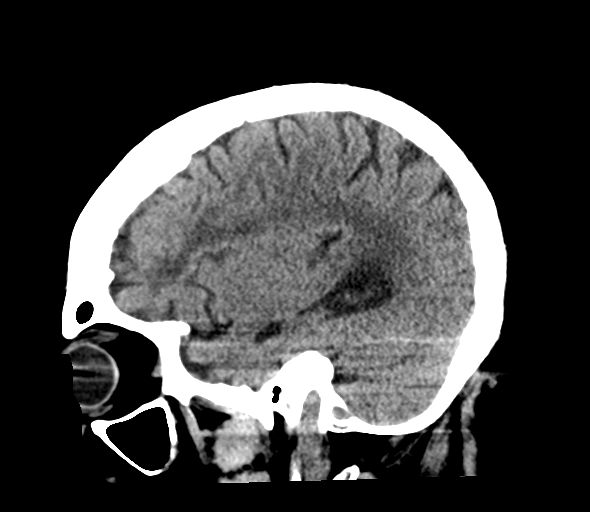

[16 of 47 positions shown; findings below may reference images not displayed]

FINDINGS: Brain: Diffuse cerebral atrophy. Ventricular dilatation consistent
with central atrophy. Low-attenuation changes in the deep white
matter consistent with small vessel ischemia. No mass-effect or
midline shift. No abnormal extra-axial fluid collections. Gray-white
matter junctions are distinct. Basal cisterns are not effaced. No
acute intracranial hemorrhage.

Vascular: Moderate intracranial arterial vascular calcifications are
present.

Skull: Calvarium appears intact. No acute depressed skull fractures.

Sinuses/Orbits: Paranasal sinuses and mastoid air cells are clear.

Other: None.
IMPRESSION: No acute intracranial abnormalities. Chronic atrophy and small
vessel ischemic changes.

## 2021-01-17 IMAGING — DX DG CHEST 2V
2 series · 2 of 2 positions shown · non-contrast
Comparison: 12/13/2018

CLINICAL DATA: Cough

EXAM:
CHEST - 2 VIEW

[chest pa]
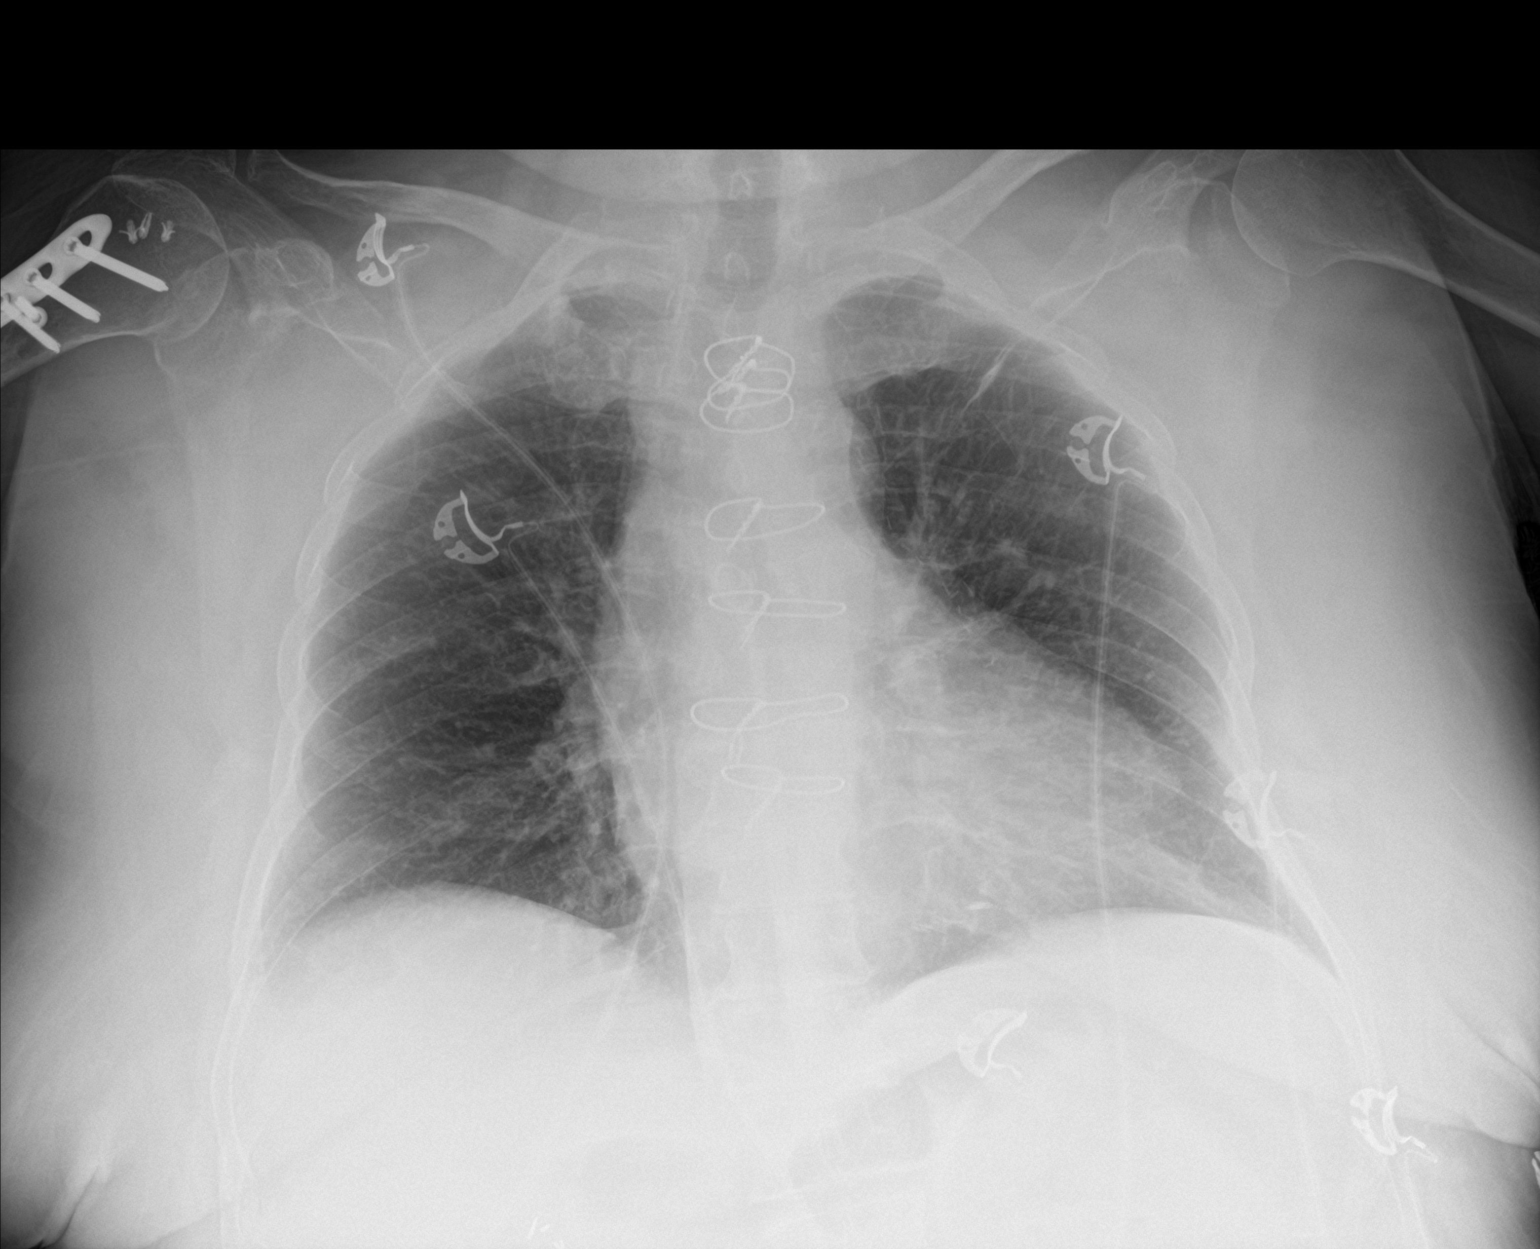

[chest lat]
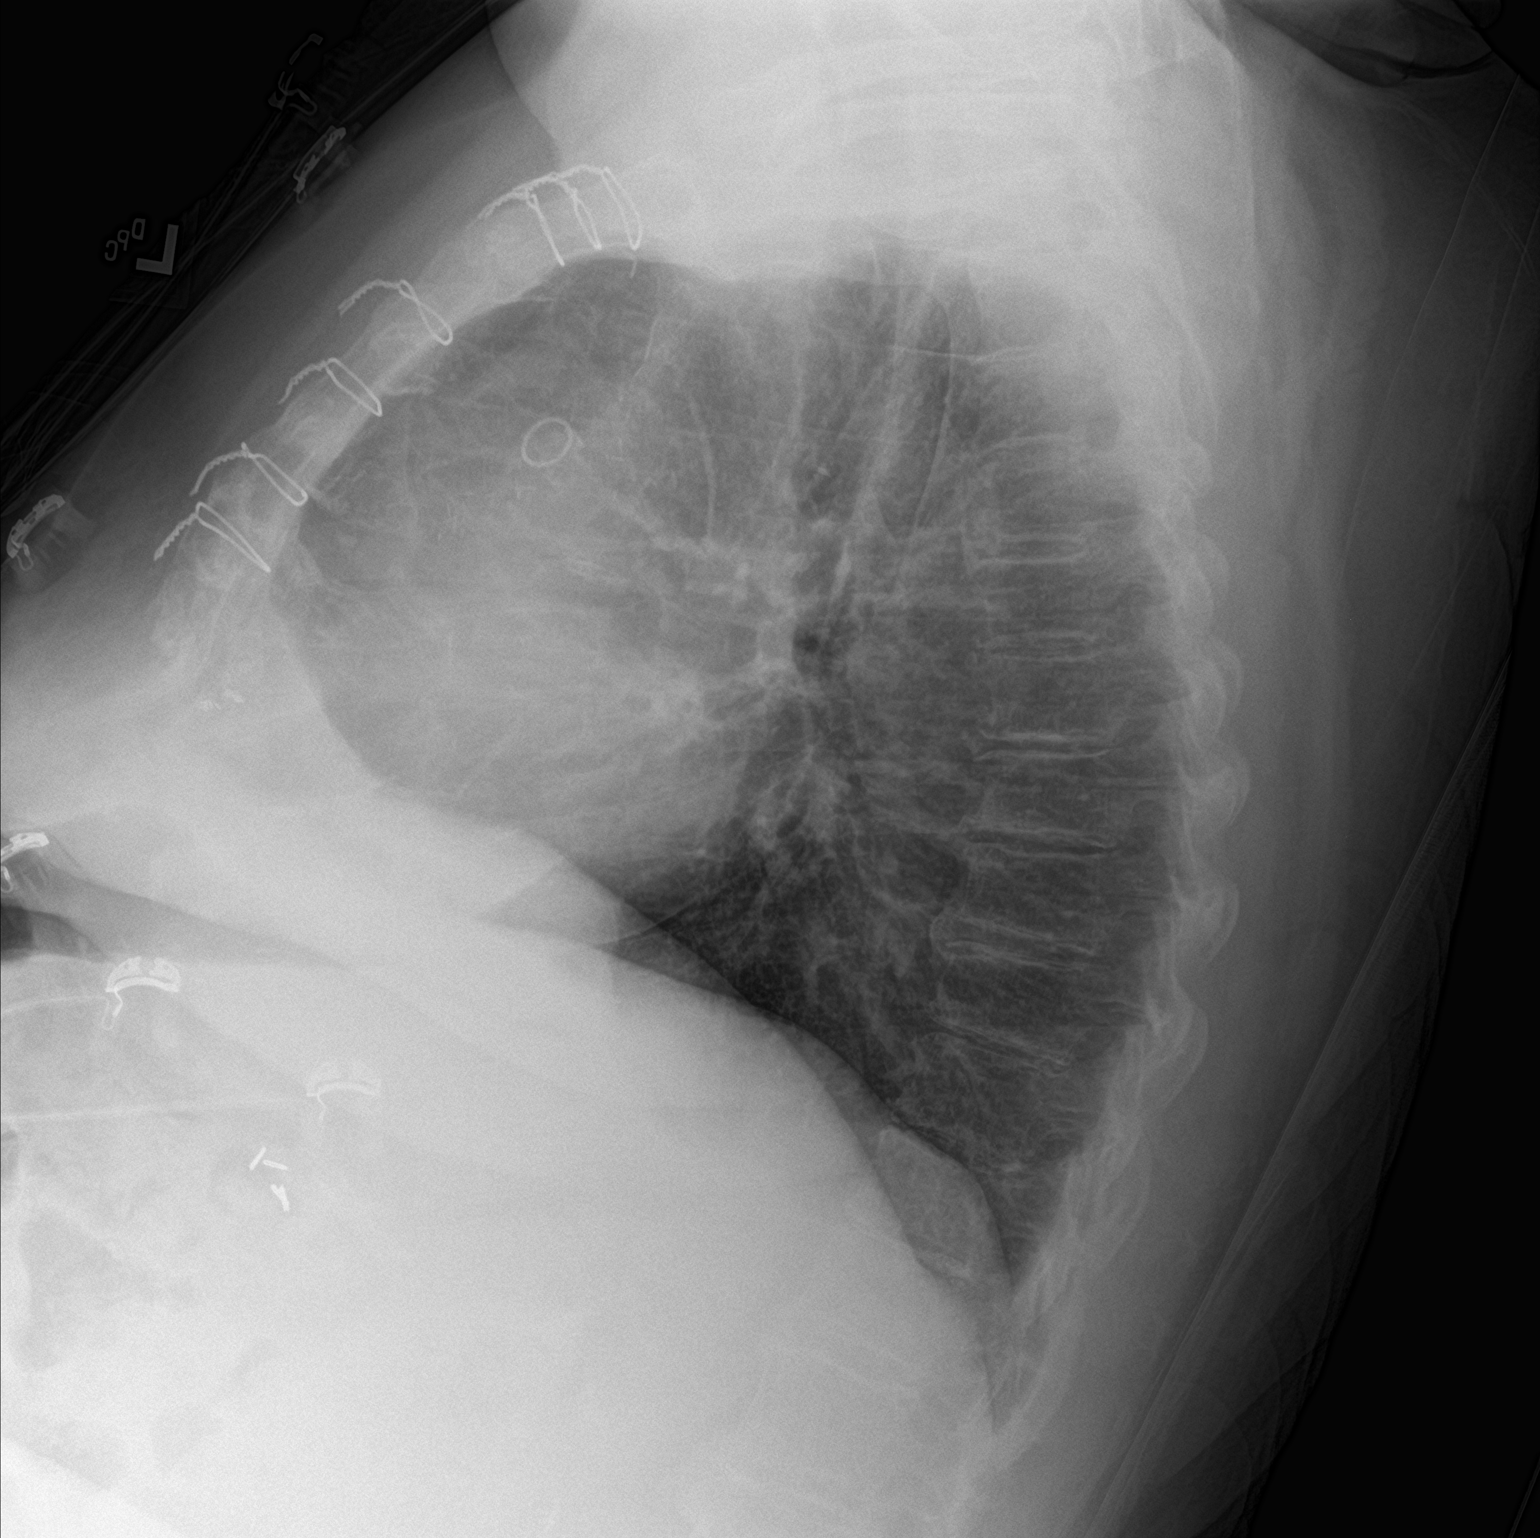

[2 of 2 positions shown; findings below may reference images not displayed]

FINDINGS: Post sternotomy changes. No acute consolidation or effusion. Stable
cardiomediastinal silhouette. No pneumothorax. Surgical plate and
fixating screws in the right humerus. Linear scarring in the left
lung apex
IMPRESSION: No active cardiopulmonary disease.

## 2021-02-12 IMAGING — DX DG CHEST 2V
2 series · 2 of 2 positions shown · non-contrast
Comparison: 01/13/2019

CLINICAL DATA: Headache and dizziness.

EXAM:
CHEST - 2 VIEW

[chest pa]
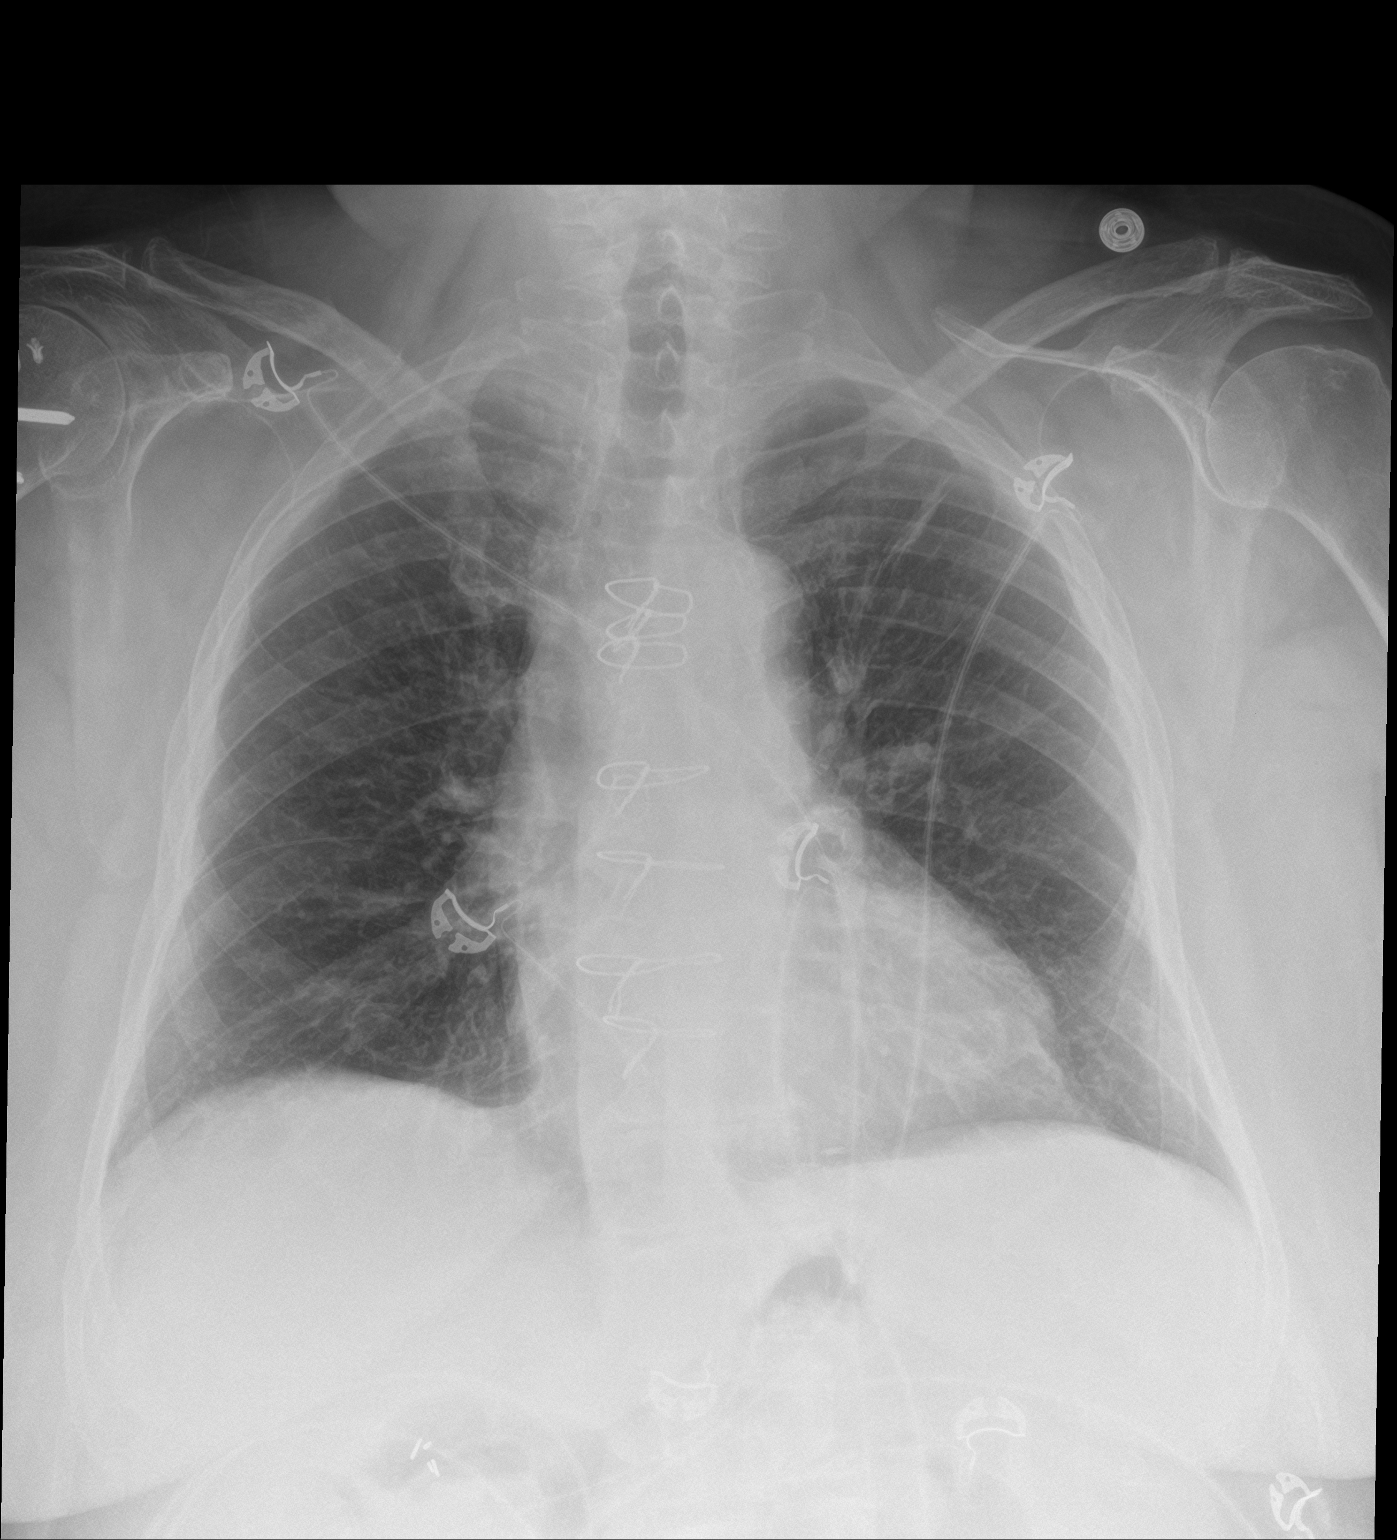

[chest lat]
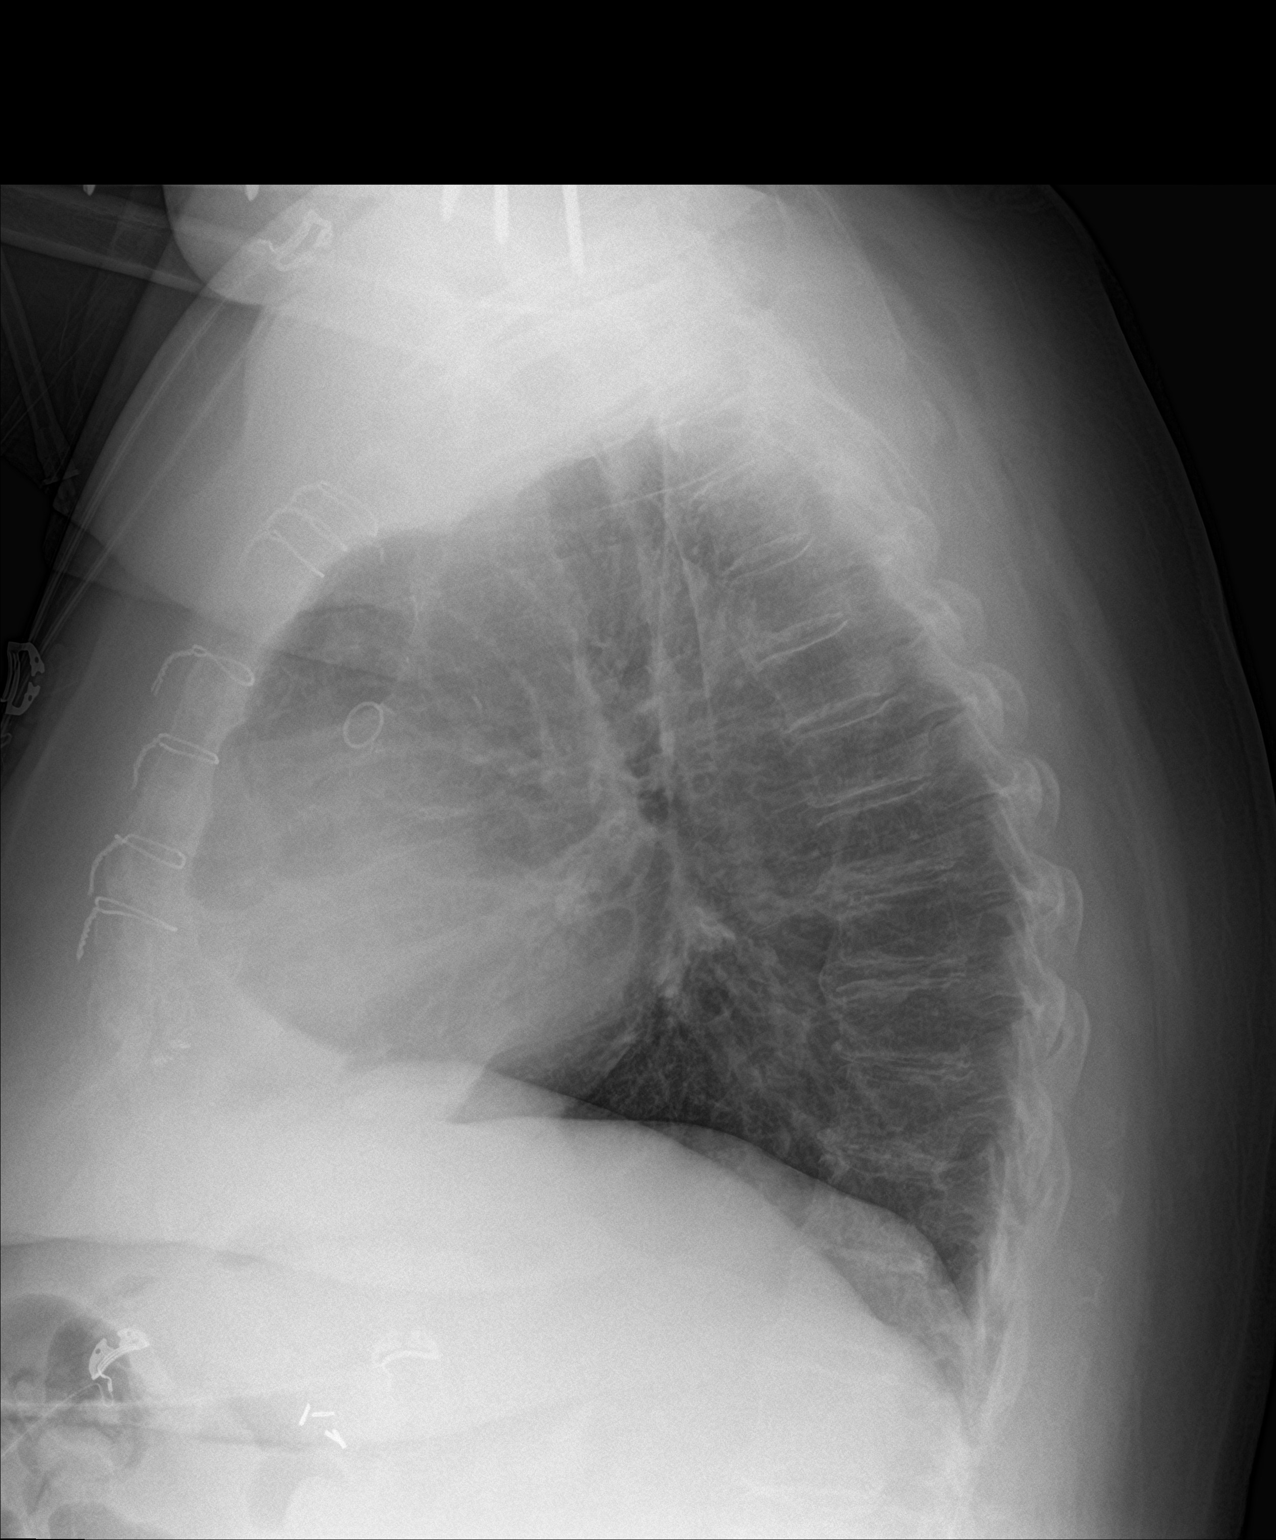

[2 of 2 positions shown; findings below may reference images not displayed]

FINDINGS: The cardiac silhouette, mediastinal and hilar contours are within
normal limits and stable. There are mild chronic bronchitic type
lung changes with streaky areas of scarring change. No acute
infiltrates, edema or effusions. The bony thorax is intact.
IMPRESSION: Chronic lung changes but no acute pulmonary findings.

## 2021-02-12 IMAGING — CT CT HEAD W/O CM
3 series · 15 of 47 positions shown, 18 images · non-contrast
Comparison: MR 02/08/2019, CT 01/03/2019

CLINICAL DATA: 82-year-old female with a history of dizziness

EXAM:
CT HEAD WITHOUT CONTRAST
TECHNIQUE: Contiguous axial images were obtained from the base of the skull
through the vertex without intravenous contrast.

[Series 2: head trauma wo · axial · 0.42mm/px · z∈[+1471,+1596]mm · 9 of 31 slices shown, 12 images]
[im 3/31  brain]
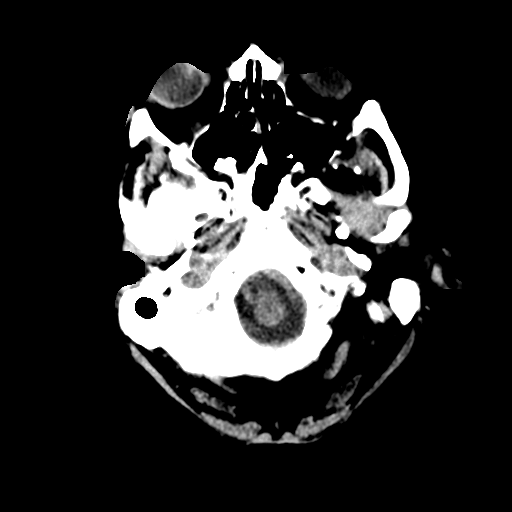
[im 3/31  bone]
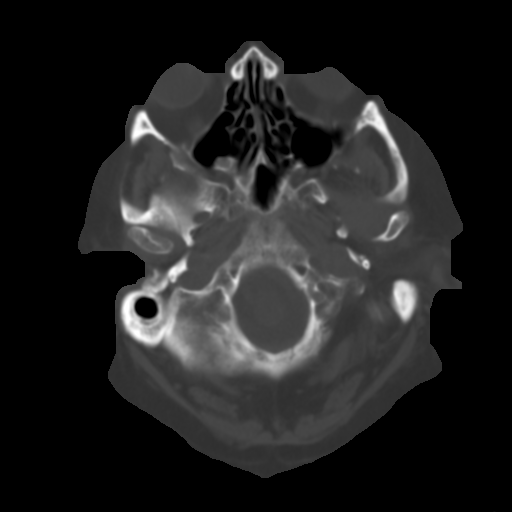
[im 6/31  brain]
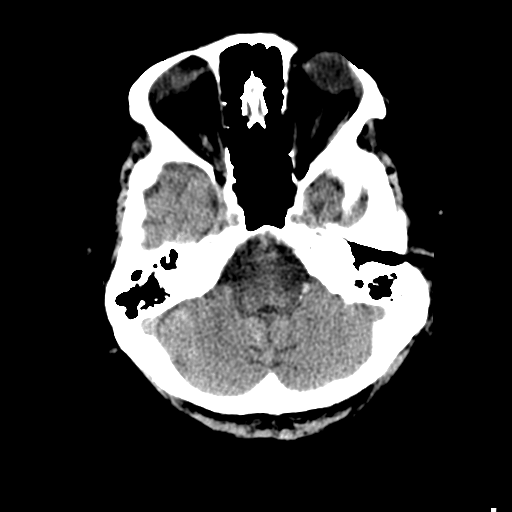
[im 9/31  brain]
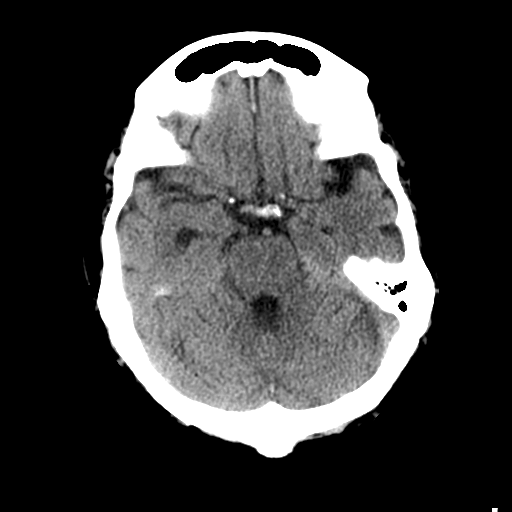
[im 12/31  brain]
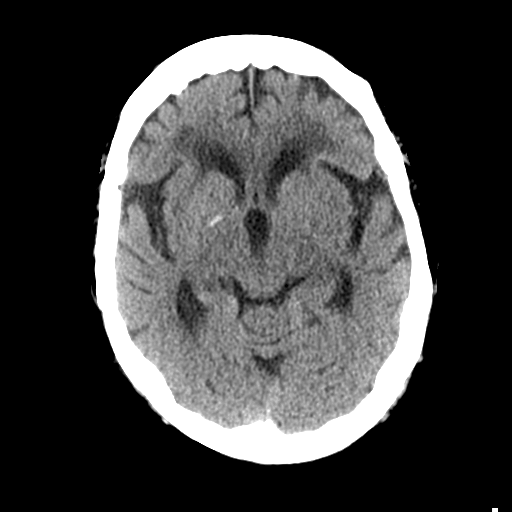
[im 16/31  brain]
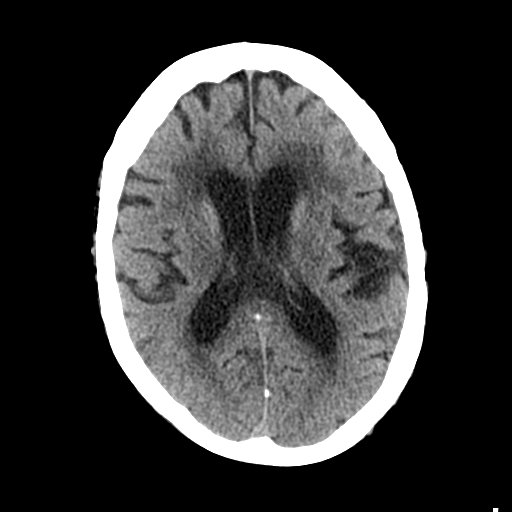
[im 16/31  bone]
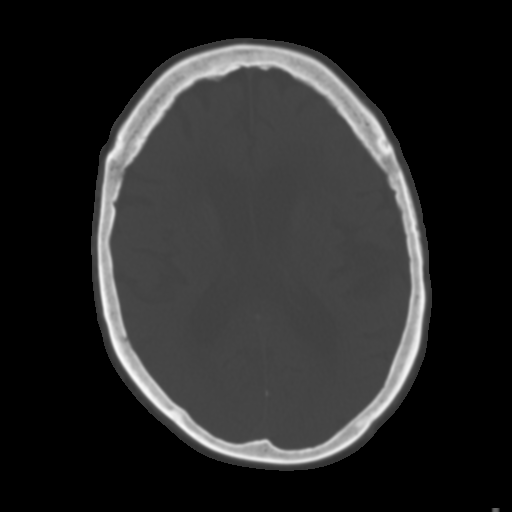
[im 19/31  brain]
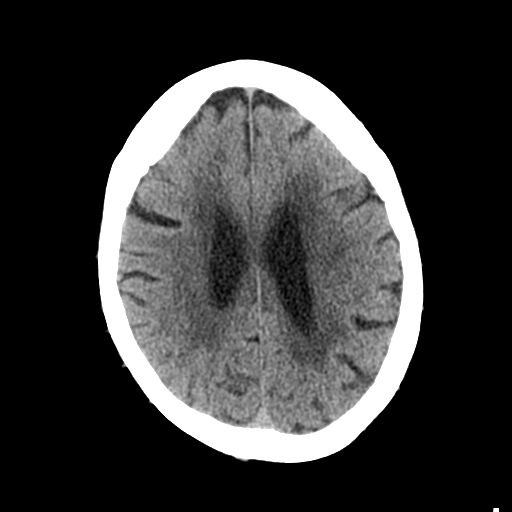
[im 22/31  brain]
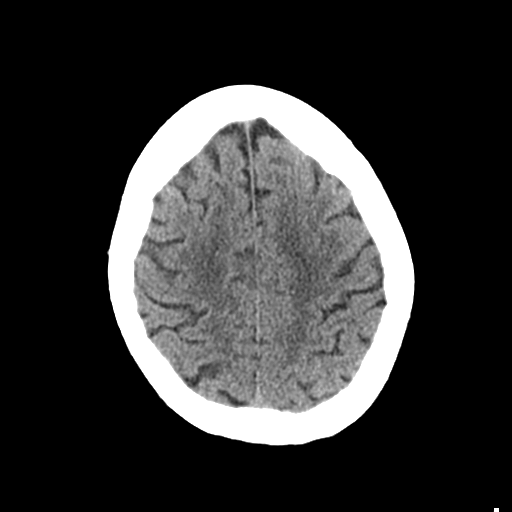
[im 25/31  brain]
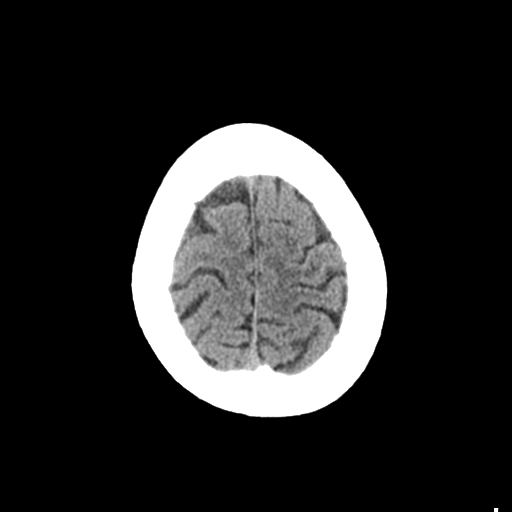
[im 28/31  brain]
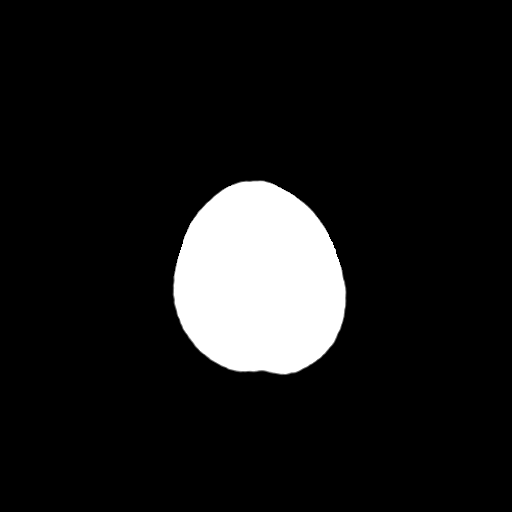
[im 28/31  bone]
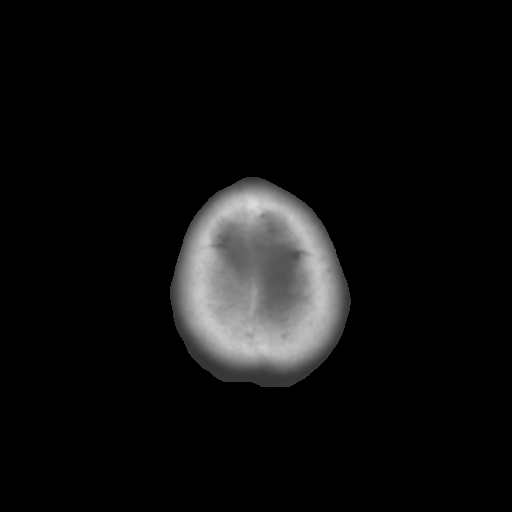

[Series 4: coronal soft tissue · coronal · 0.31mm/px · 3 of 66 slices shown]
[im 22/66  brain]
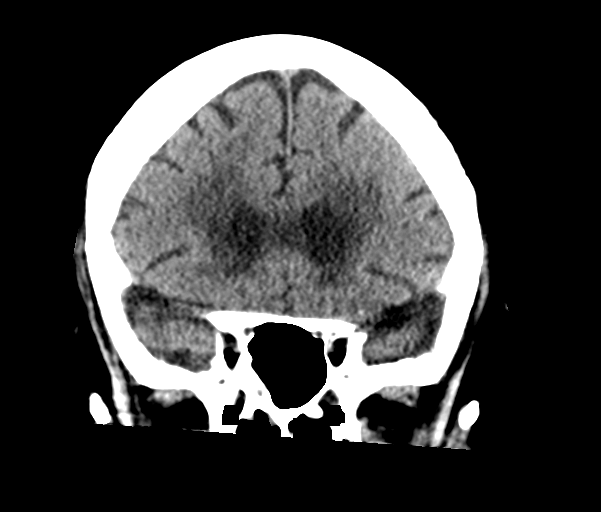
[im 29/66  brain]
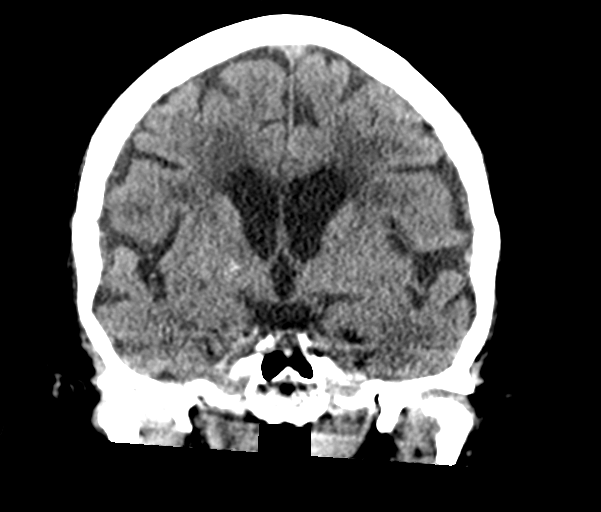
[im 37/66  brain]
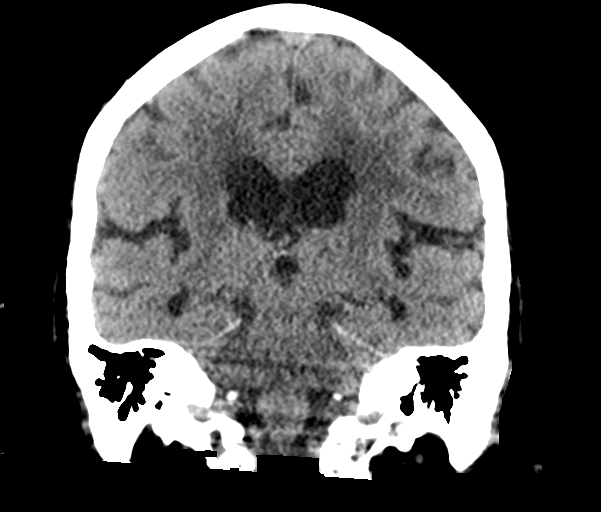

[Series 5: sagittal soft tissue · sagittal · 0.31mm/px · 3 of 63 slices shown]
[im 21/63  brain]
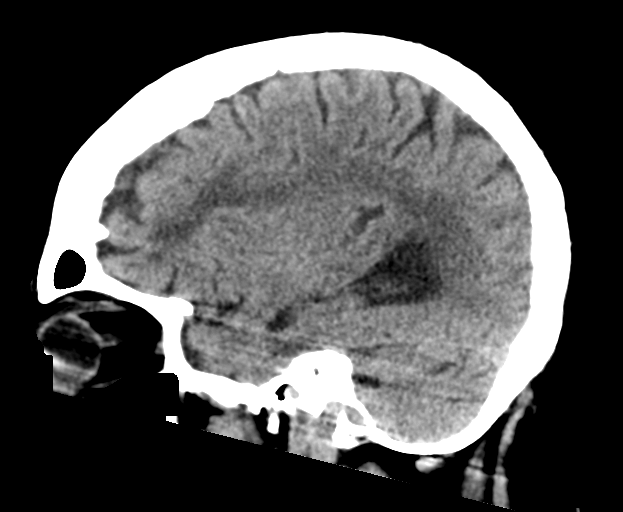
[im 32/63  brain]
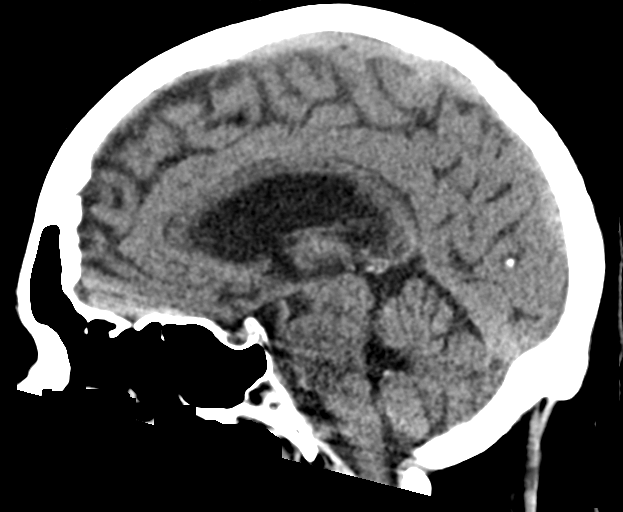
[im 42/63  brain]
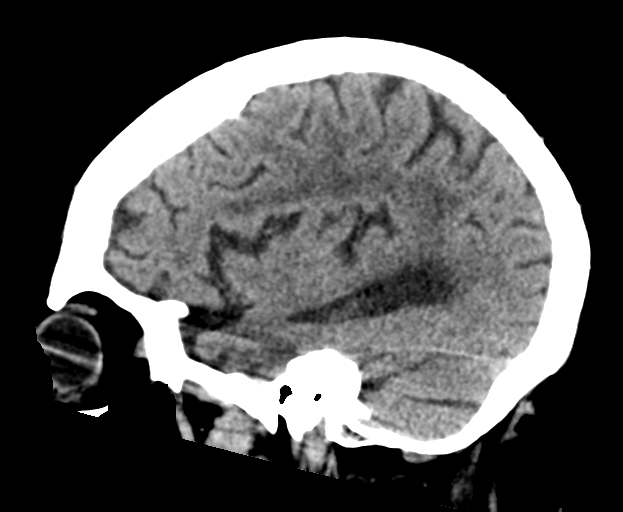

[15 of 47 positions shown; findings below may reference images not displayed]

FINDINGS: Brain: No acute intracranial hemorrhage. No midline shift or mass
effect. Gray-white differentiation maintained. Unremarkable
configuration of the ventricles. Confluent hypodensity in the
bilateral periventricular white matter.

Vascular: Intracranial atherosclerosis of anterior circulation

Skull: No acute displaced fracture.  No aggressive bony lesions.

Sinuses/Orbits: No acute finding.

Other: None
IMPRESSION: Negative for acute intracranial abnormality.

Evidence of chronic microvascular ischemic disease

## 2021-02-16 IMAGING — DX DG CHEST 1V PORT
1 series · 1 of 1 positions shown · non-contrast
Comparison: PA and lateral chest 02/08/2019 and 02/28/2018.

CLINICAL DATA: Status post fall this morning getting out of bed.

EXAM:
PORTABLE CHEST 1 VIEW

[chest ap]
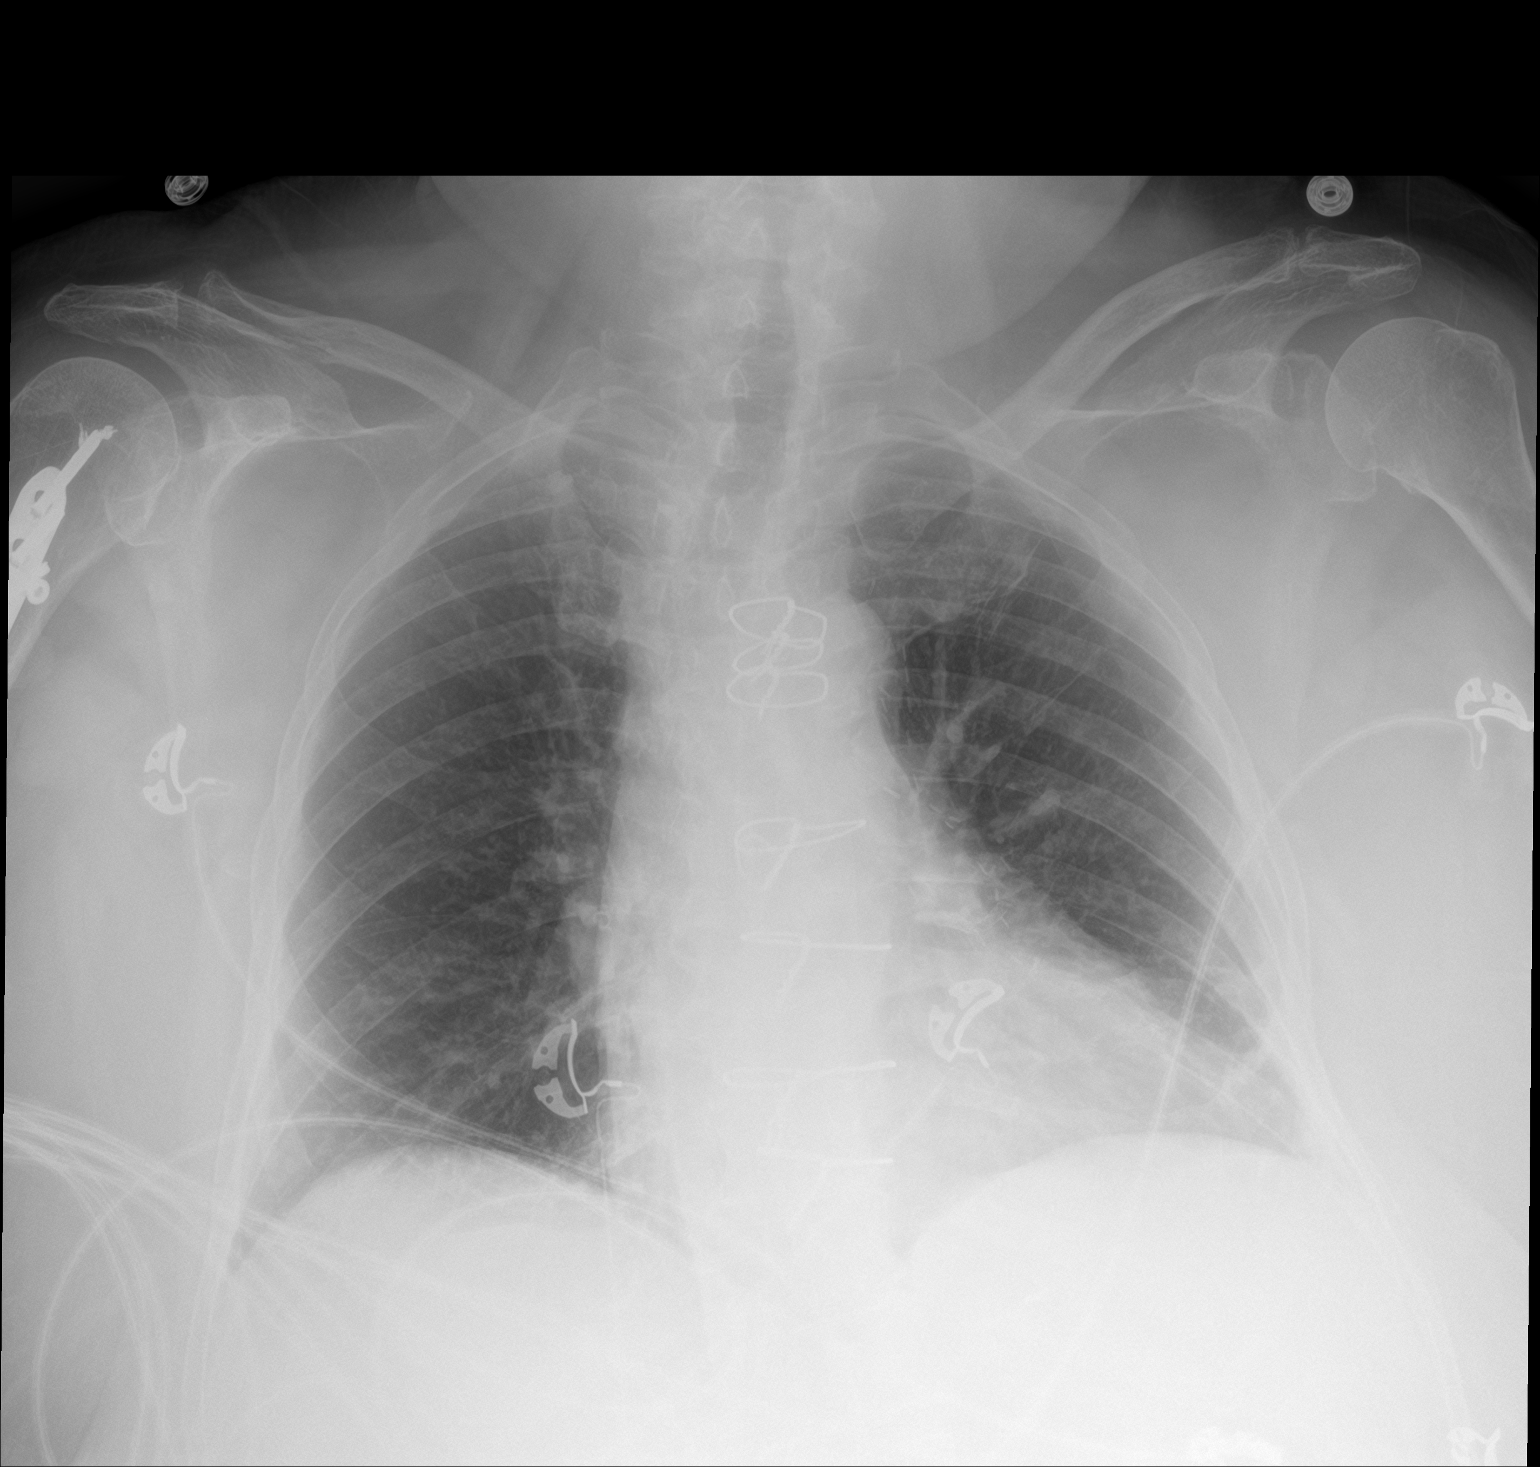

[1 of 1 positions shown; findings below may reference images not displayed]

FINDINGS: The patient is status post CABG. Heart size is upper normal. Mild
discoid atelectasis left lung base noted. Lungs otherwise clear. No
pneumothorax or pleural effusion. No acute bony abnormality. There
is partial visualization of fixation of a right humerus fracture and
rotator cuff repair on the right.
IMPRESSION: No acute disease.

## 2021-02-16 IMAGING — CT CT HEAD W/O CM
3 series · 16 of 47 positions shown, 19 images · non-contrast
Comparison: 02/08/2019

CLINICAL DATA: Ataxia with stroke suspected

EXAM:
CT HEAD WITHOUT CONTRAST
TECHNIQUE: Contiguous axial images were obtained from the base of the skull
through the vertex without intravenous contrast.

[Series 2: head trauma wo · axial · 0.44mm/px · z∈[+12,+137]mm · 10 of 31 slices shown, 13 images]
[im 3/31  brain]
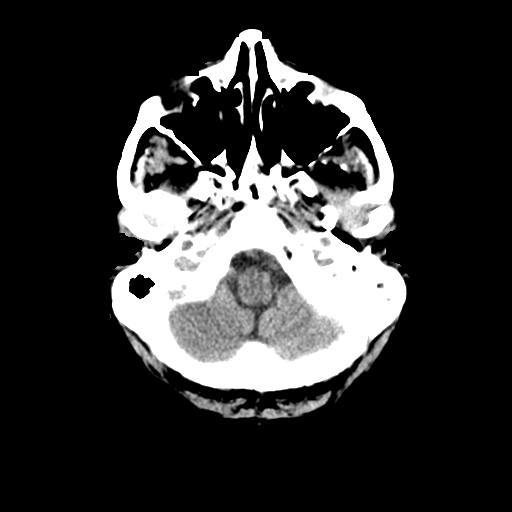
[im 3/31  bone]
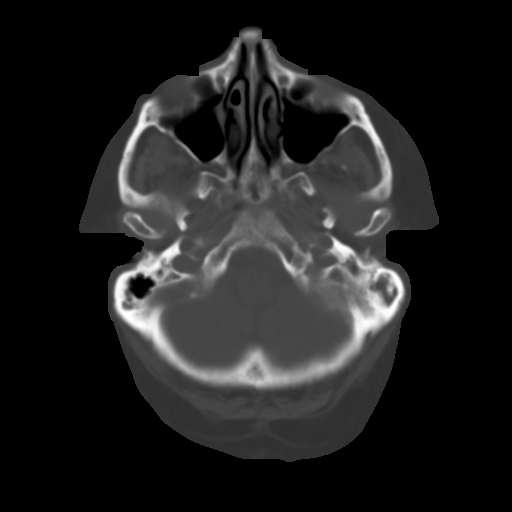
[im 6/31  brain]
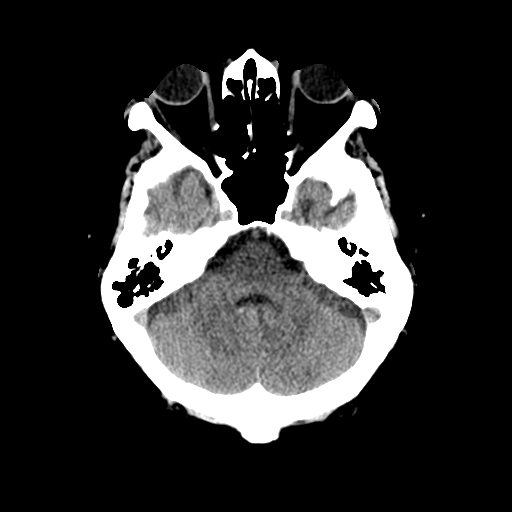
[im 9/31  brain]
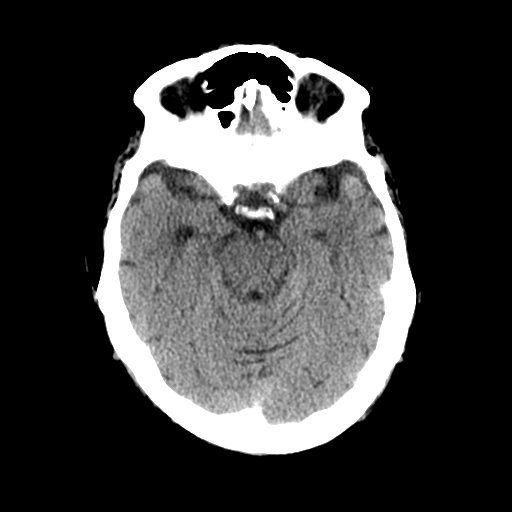
[im 11/31  brain]
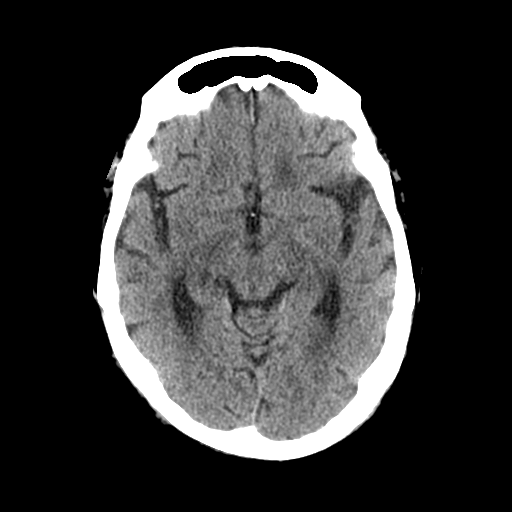
[im 14/31  brain]
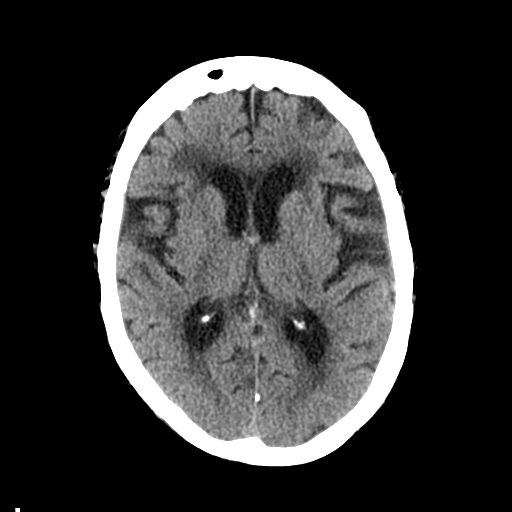
[im 14/31  bone]
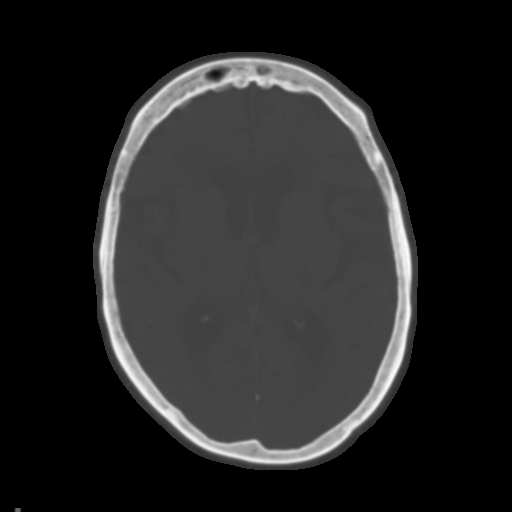
[im 17/31  brain]
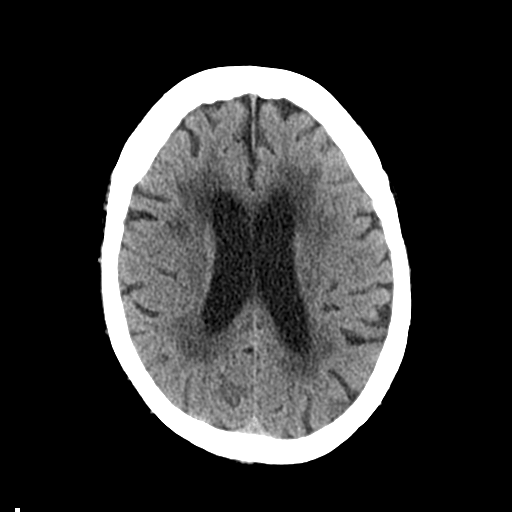
[im 20/31  brain]
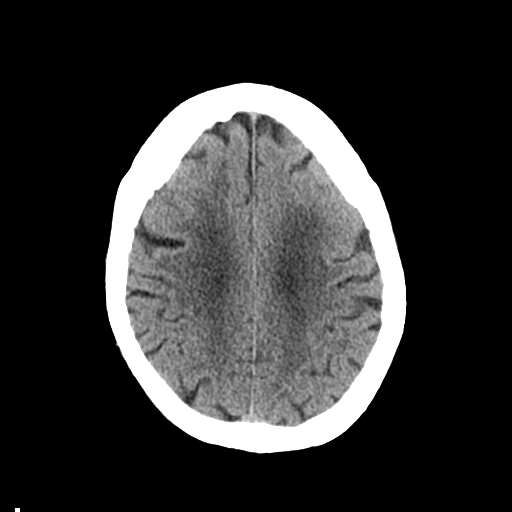
[im 23/31  brain]
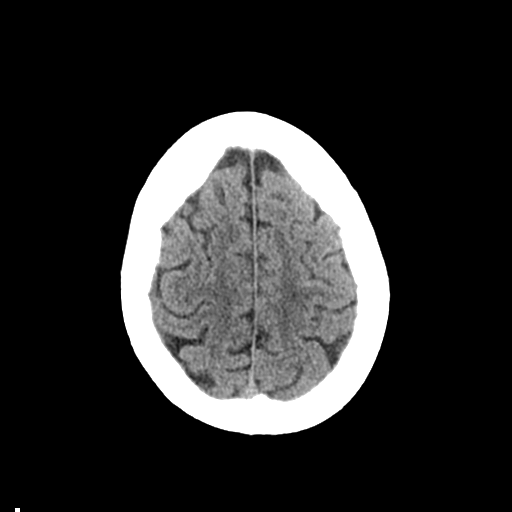
[im 25/31  brain]
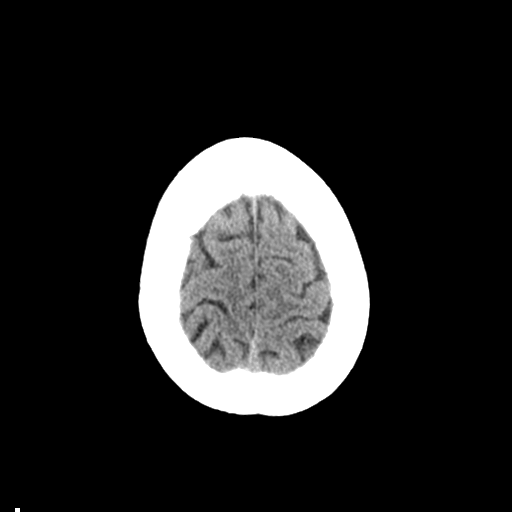
[im 25/31  bone]
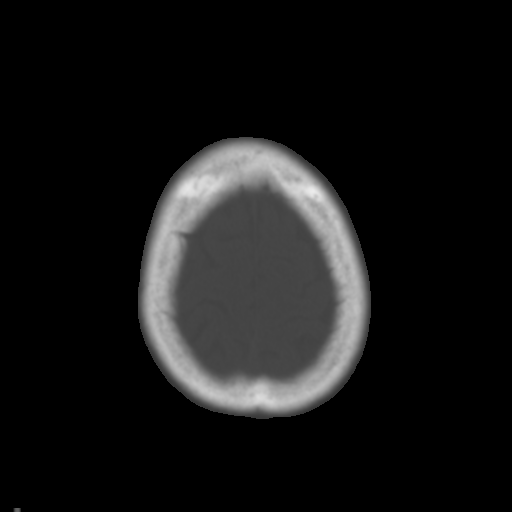
[im 28/31  brain]
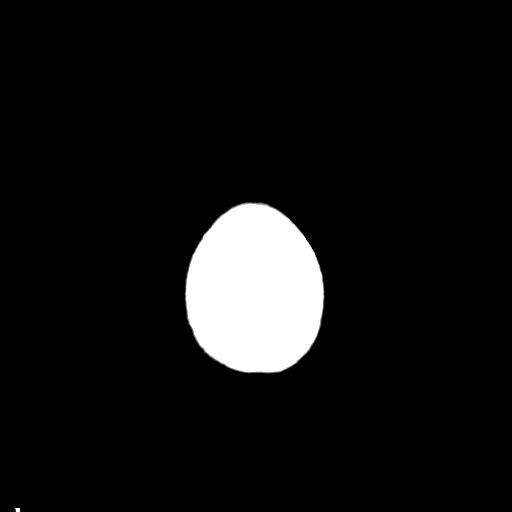

[Series 4: coronal soft tissue · coronal · 0.31mm/px · 3 of 68 slices shown]
[im 23/68  brain]
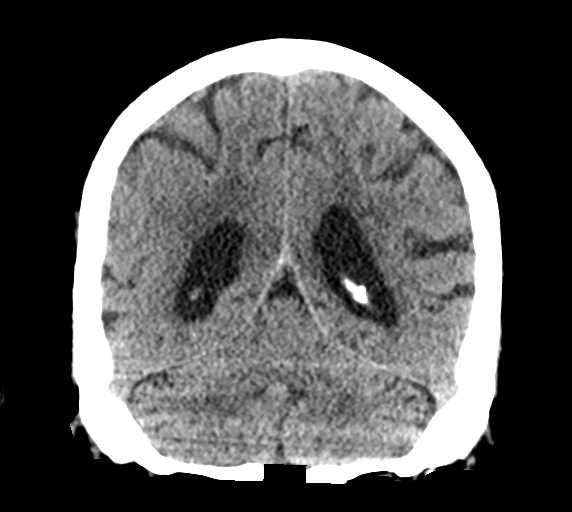
[im 30/68  brain]
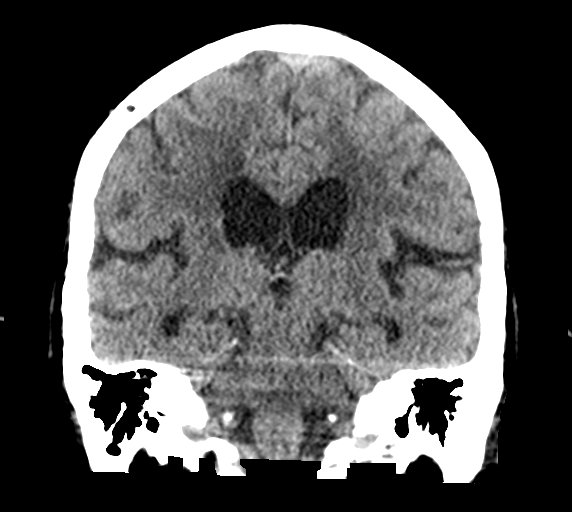
[im 38/68  brain]
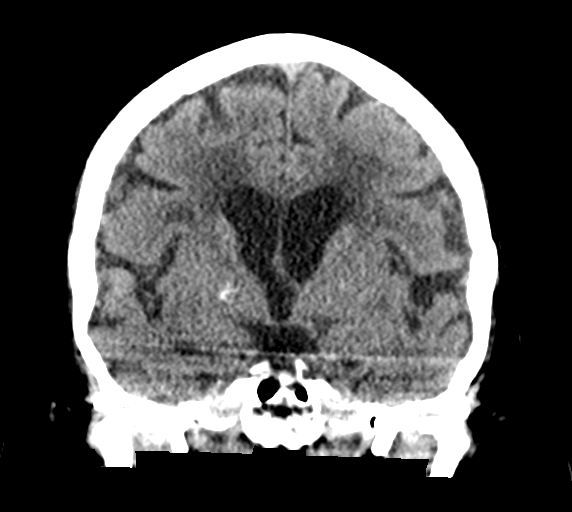

[Series 5: sagittal soft tissue · sagittal · 0.32mm/px · 3 of 60 slices shown]
[im 20/60  brain]
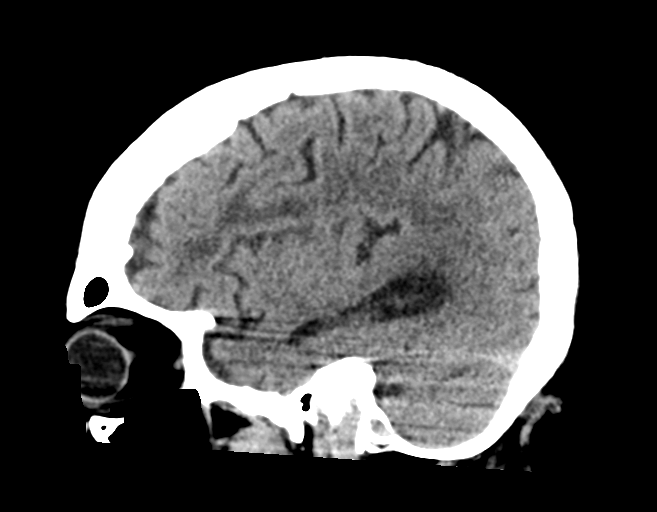
[im 30/60  brain]
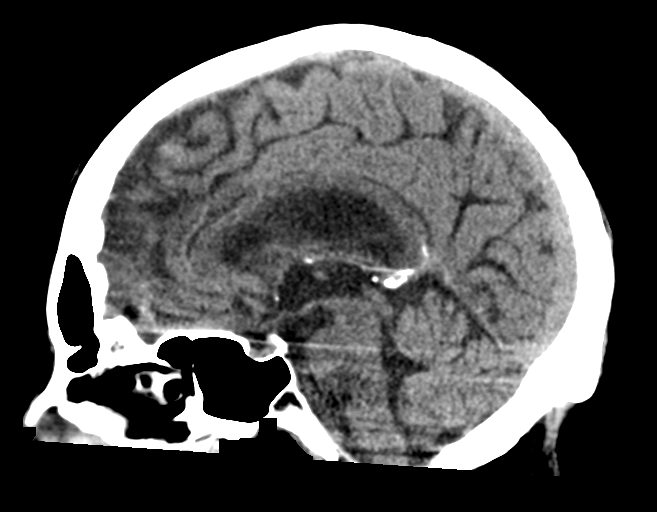
[im 40/60  brain]
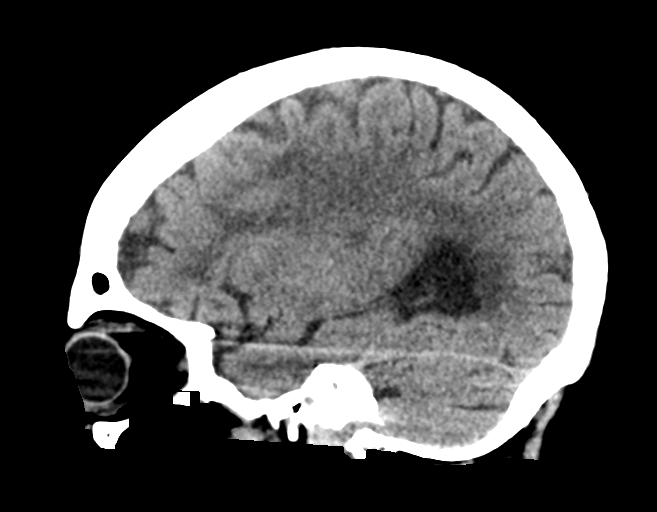

[16 of 47 positions shown; findings below may reference images not displayed]

FINDINGS: Brain: No evidence of acute infarction, hemorrhage, hydrocephalus,
extra-axial collection or mass lesion/mass effect. Mild for age
cerebral volume loss. Chronic small vessel ischemia in the white
matter.

Vascular: No hyperdense vessel or unexpected calcification.

Skull: Normal. Negative for fracture or focal lesion.

Sinuses/Orbits: No acute finding.
IMPRESSION: Senescent changes without acute finding.

## 2021-02-21 IMAGING — MR MR MRA HEAD W/O CM
6 series · 35 of 48 positions shown · non-contrast
Comparison: MRA brain 11/02/2018

Head CT 02/17/2019

Brain MRI 02/08/2019

CLINICAL DATA: Left-sided weakness and fall.

EXAM:
MRI HEAD WITHOUT CONTRAST
MRA HEAD WITHOUT CONTRAST
TECHNIQUE: Multiplanar, multiecho pulse sequences of the brain and surrounding
structures were obtained without intravenous contrast. Angiographic
images of the head were obtained using MRA technique without
contrast.

[Series 2: DWI · axial · 3.0mm · 0.69mm/px · z∈[-89,+68]mm · 7 of 55 slices shown (1 of 4)]
[im 1/55]
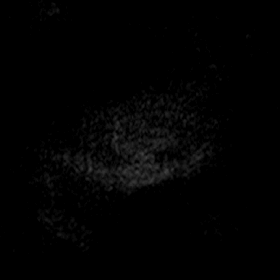
[im 10/55]
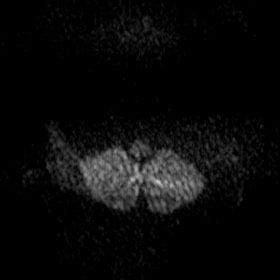
[im 19/55]
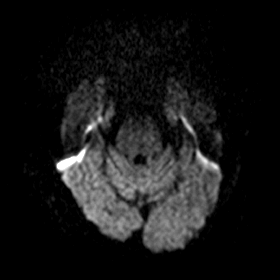
[im 28/55]
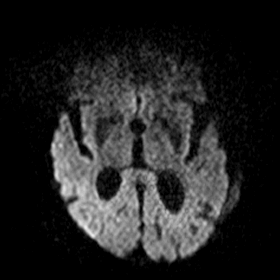
[im 37/55]
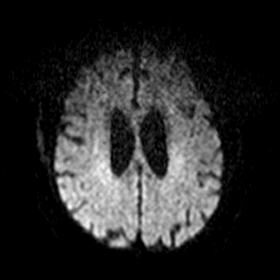
[im 46/55]
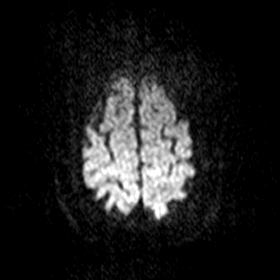
[im 55/55]
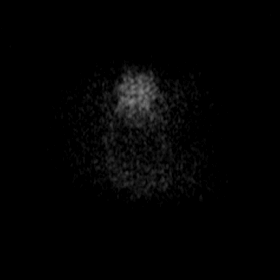

[Series 3: DWI · axial · 3.0mm · 0.69mm/px · z∈[-89,+68]mm · 7 of 55 slices shown (2 of 4)]
[im 1/55]
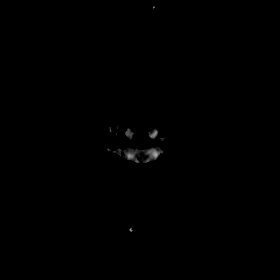
[im 10/55]
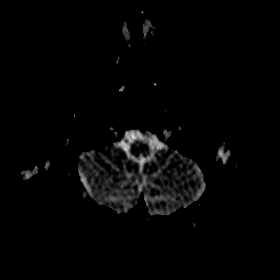
[im 19/55]
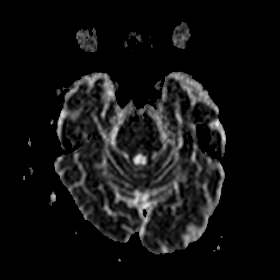
[im 28/55]
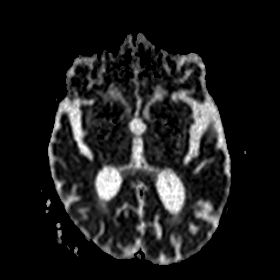
[im 37/55]
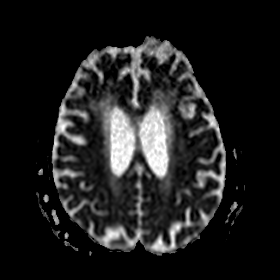
[im 46/55]
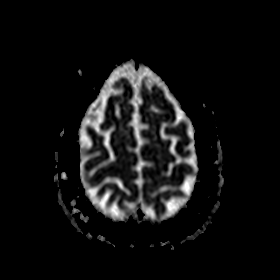
[im 55/55]
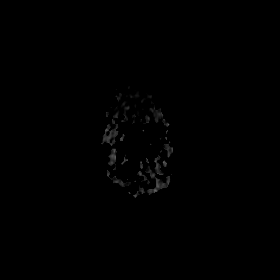

[Series 4: DWI · coronal · 5.0mm · 0.47mm/px · 5 of 36 slices shown (3 of 4)]
[im 1/36]
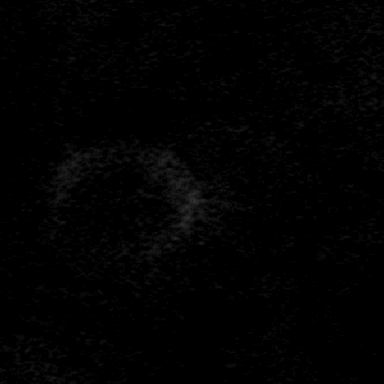
[im 9/36]
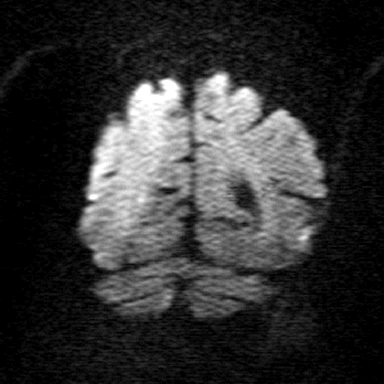
[im 18/36]
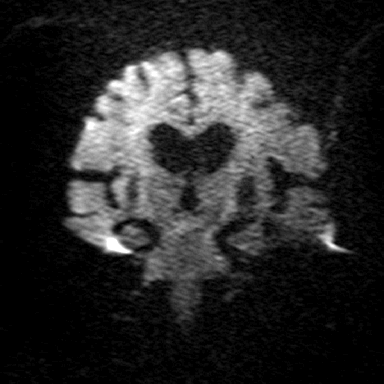
[im 27/36]
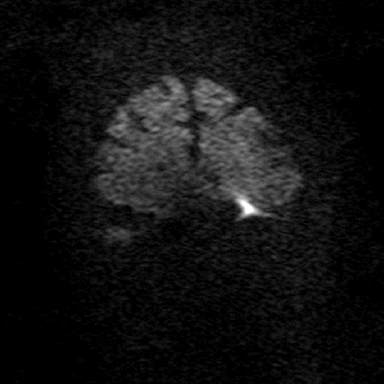
[im 36/36]
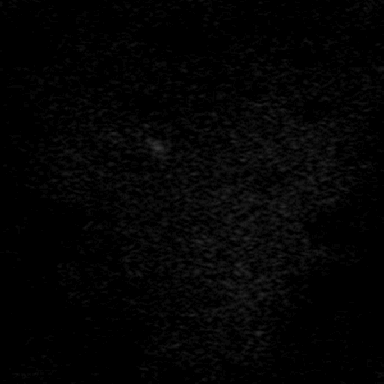

[Series 5: DWI · coronal · 5.0mm · 0.47mm/px · 5 of 35 slices shown (4 of 4)]
[im 1/35]
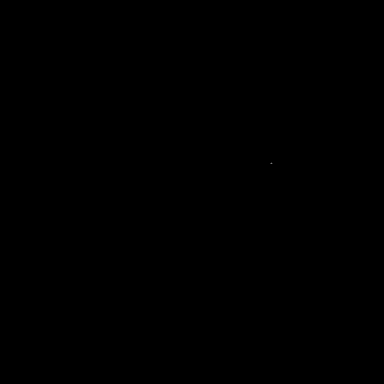
[im 9/35]
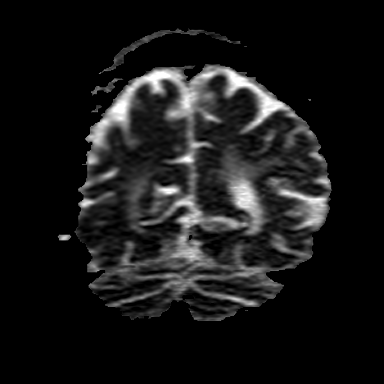
[im 18/35]
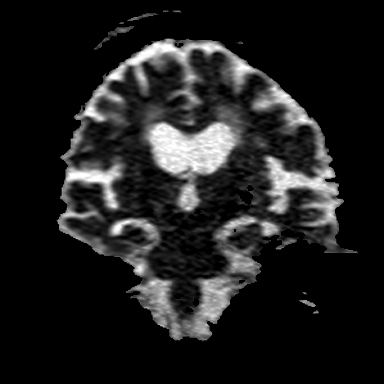
[im 26/35]
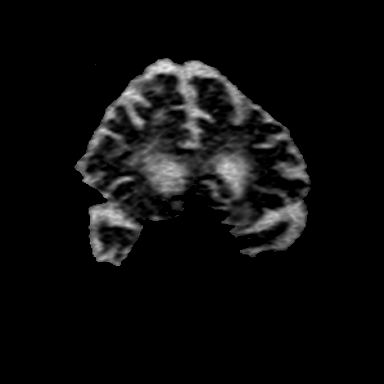
[im 35/35]
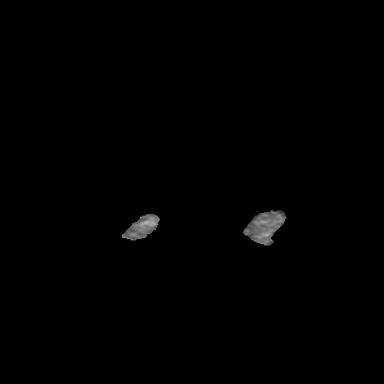

[Series 8: FLAIR · axial · 3.0mm · 0.86mm/px · z∈[-71,+63]mm · 6 of 47 slices shown]
[im 1/47]
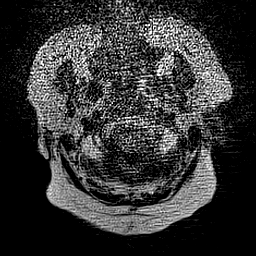
[im 10/47]
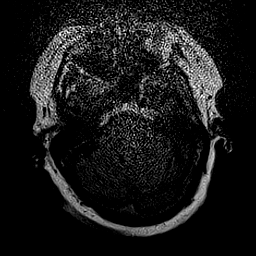
[im 19/47]
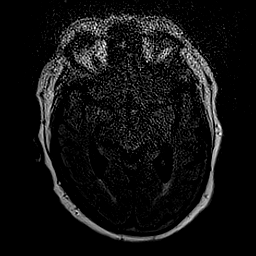
[im 28/47]
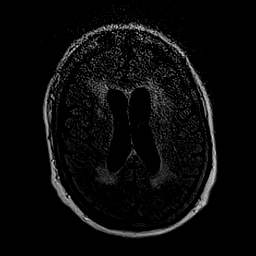
[im 37/47]
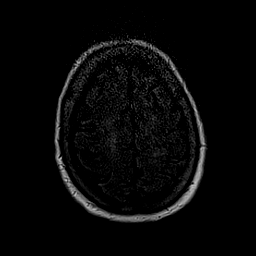
[im 47/47]
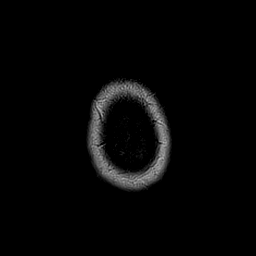

[Series 9: MRA · axial · 0.8mm · 0.38mm/px · z∈[-77,-23]mm · 5 of 131 slices shown]
[im 8/131]
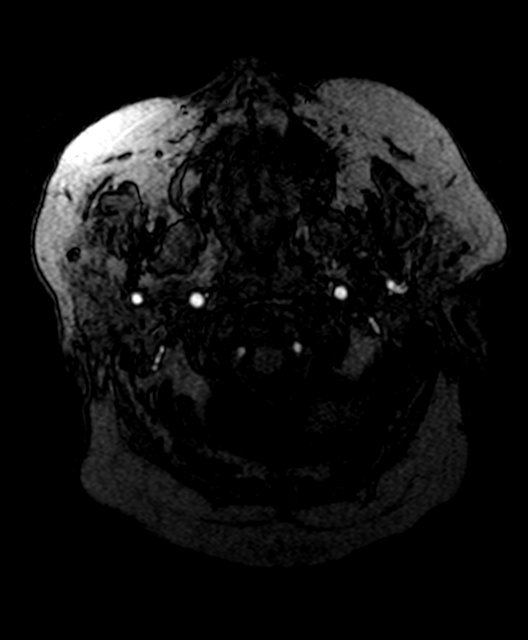
[im 23/131]
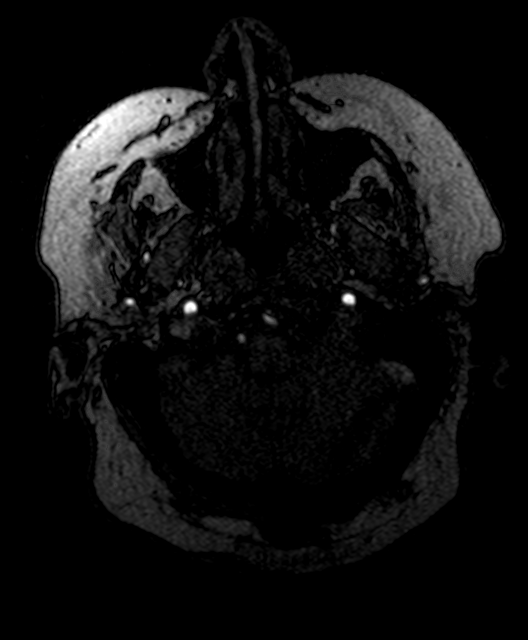
[im 39/131]
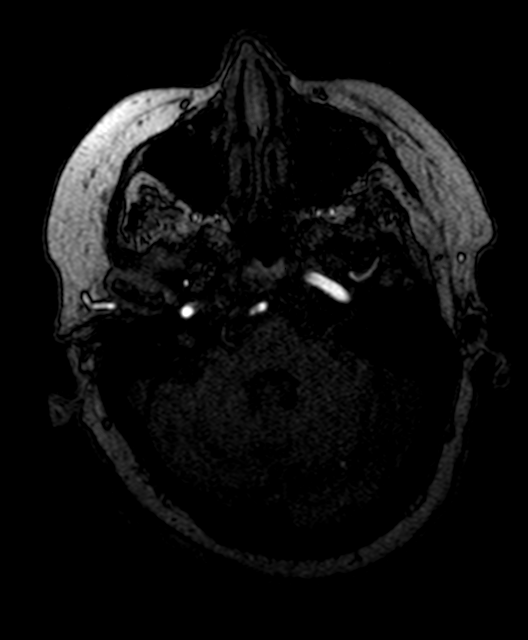
[im 54/131]
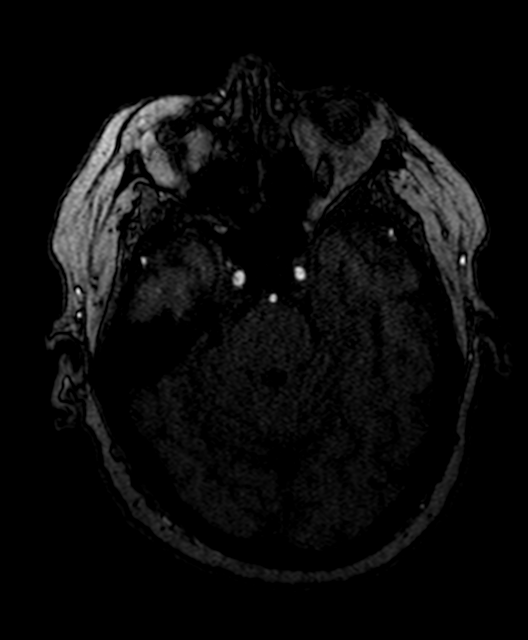
[im 77/131]
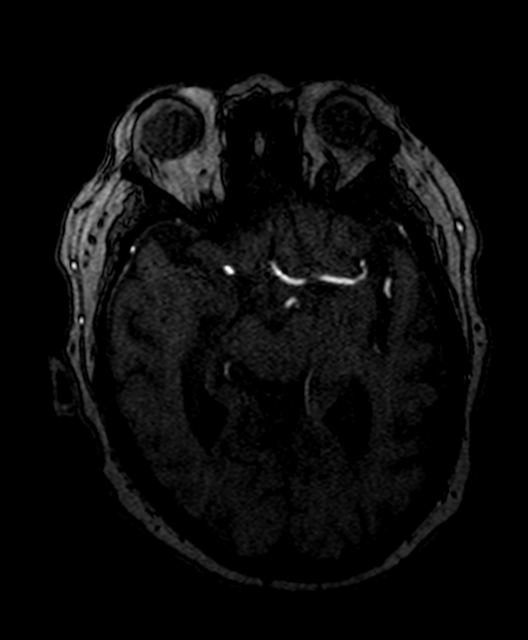

[35 of 48 positions shown; findings below may reference images not displayed]

FINDINGS: MRI HEAD FINDINGS

BRAIN: There is no acute infarct, acute hemorrhage or mass effect.
The midline structures are normal. Old right thalamic lacunar
infarct. Early confluent hyperintense T2-weighted signal of the
periventricular and deep white matter, most commonly due to chronic
ischemic microangiopathy. Generalized atrophy without lobar
predilection. Susceptibility-sensitive sequences show no chronic
microhemorrhage or superficial siderosis.

SKULL AND UPPER CERVICAL SPINE: The visualized skull base,
calvarium, upper cervical spine and extracranial soft tissues are
normal.

SINUSES/ORBITS: No fluid levels or advanced mucosal thickening. No
mastoid or middle ear effusion. The orbits are normal.

MRA HEAD FINDINGS

POSTERIOR CIRCULATION:

--Basilar artery: Normal.

--Posterior cerebral arteries: Multifocal severe stenosis of both P2
segments. The right PCA is predominantly supplied by the posterior
communicating artery.

--Superior cerebellar arteries: Normal.

--Inferior cerebellar arteries: Normal anterior and posterior
inferior cerebellar arteries.

ANTERIOR CIRCULATION:

--Intracranial internal carotid arteries: Normal.

--Anterior cerebral arteries: Normal. Hypoplastic right A1 segment,
normal variant.

--Middle cerebral arteries: There are multiple moderate-to-severe
stenoses of the proximal right M2 branches. There is severe stenosis
of the distal left M1 and proximal M2 branches. The left M1 stenosis
is new compared to 11/02/18.

--Posterior communicating arteries: Right p-comm gives rise to the
right PCA. No left p-comm is visualized.
IMPRESSION: 1. No acute ischemic infarct.
2. Chronic ischemic microangiopathy and generalized atrophy.
3. New, severe stenosis of the distal left M1 MCA segment. Unchanged
bilateral MCA M2 range moderate-to-severe stenoses.
4. Unchanged multifocal severe stenoses of both posterior cerebral
artery P2 segments.

## 2021-02-21 IMAGING — CT CT HEAD CODE STROKE
3 series · 15 of 47 positions shown, 18 images · non-contrast
Comparison: CT head without contrast 02/12/2019. MRI brain
02/08/2019

CLINICAL DATA: Code stroke. Fall today. New onset left lower
extremity weakness.

EXAM:
CT HEAD WITHOUT CONTRAST
TECHNIQUE: Contiguous axial images were obtained from the base of the skull
through the vertex without intravenous contrast.

[Series 2: head code stroke wo · axial · 0.42mm/px · z∈[+41,+171]mm · 9 of 32 slices shown, 12 images]
[im 3/32  brain]
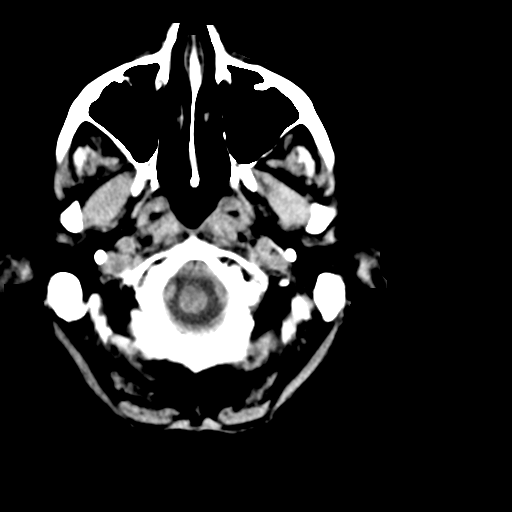
[im 3/32  bone]
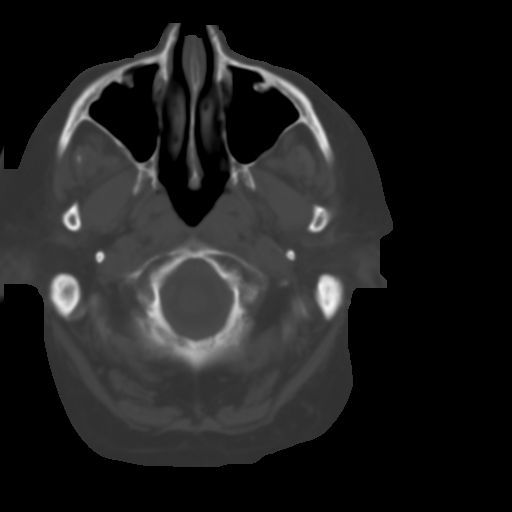
[im 6/32  brain]
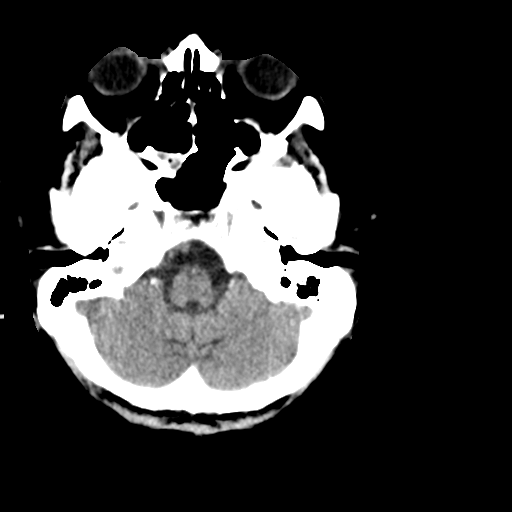
[im 9/32  brain]
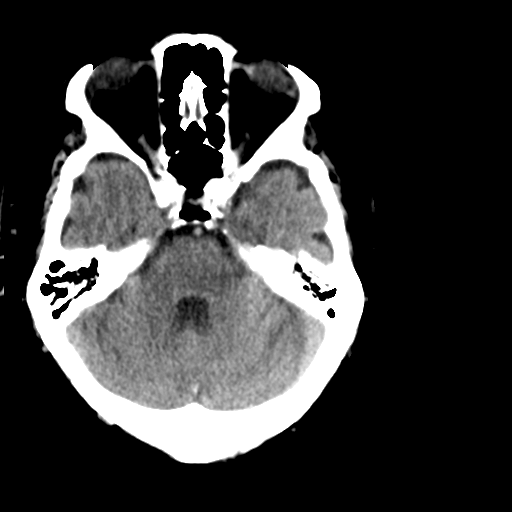
[im 12/32  brain]
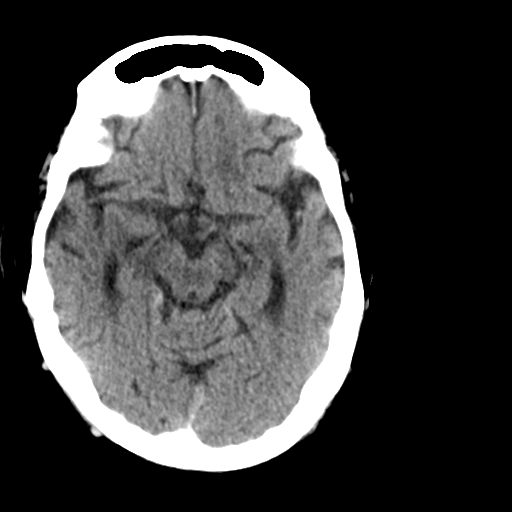
[im 17/32  brain]
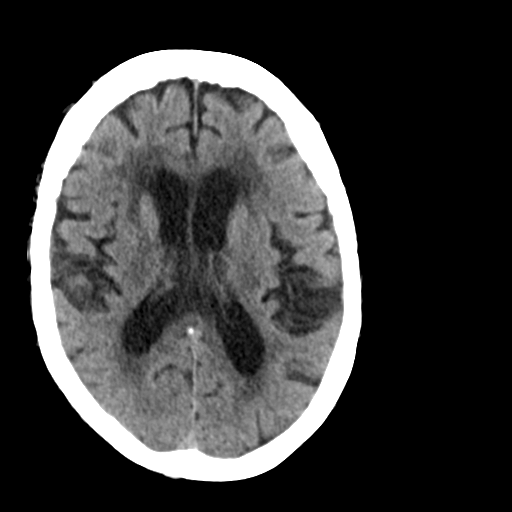
[im 17/32  bone]
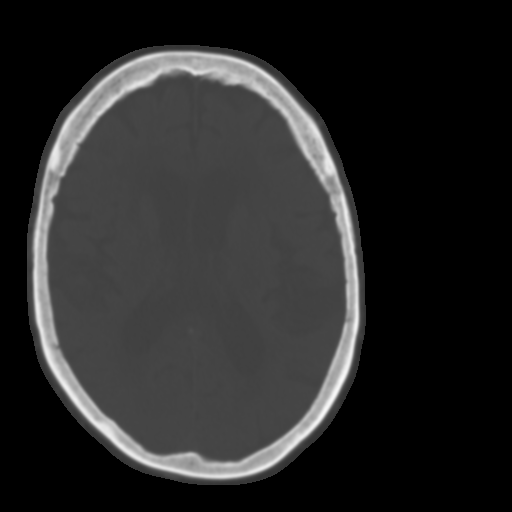
[im 20/32  brain]
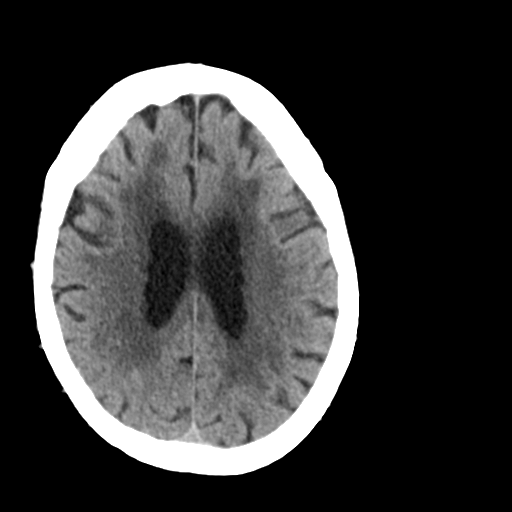
[im 23/32  brain]
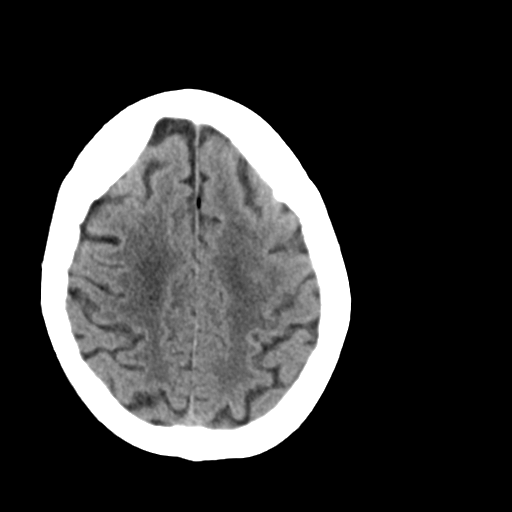
[im 26/32  brain]
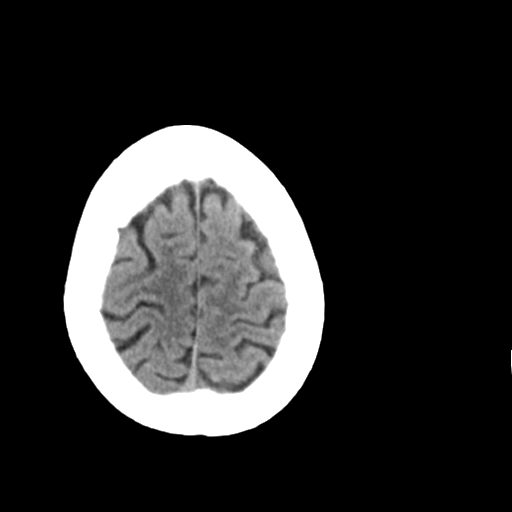
[im 29/32  brain]
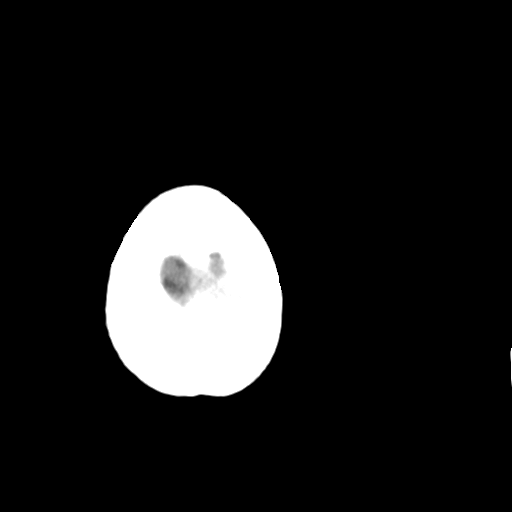
[im 29/32  bone]
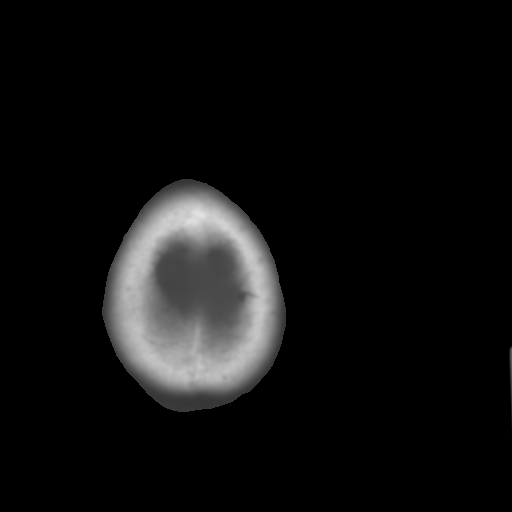

[Series 4: coronal soft tissue · coronal · 0.31mm/px · 3 of 67 slices shown]
[im 23/67  brain]
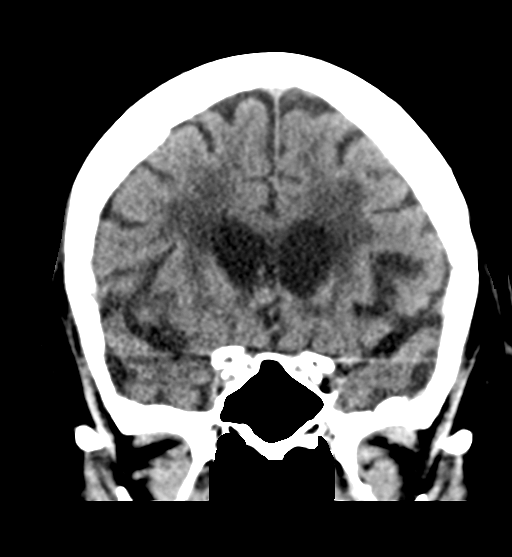
[im 30/67  brain]
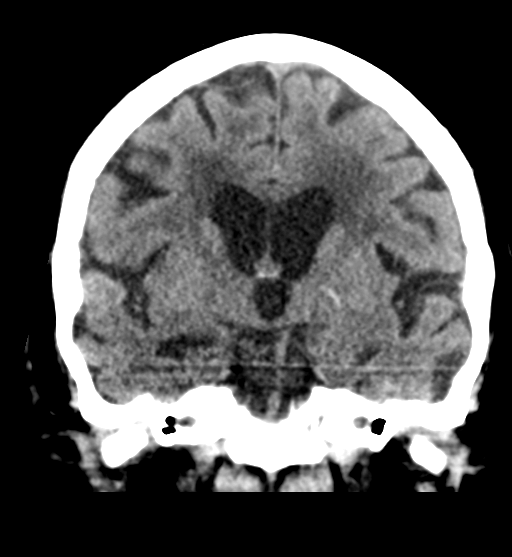
[im 37/67  brain]
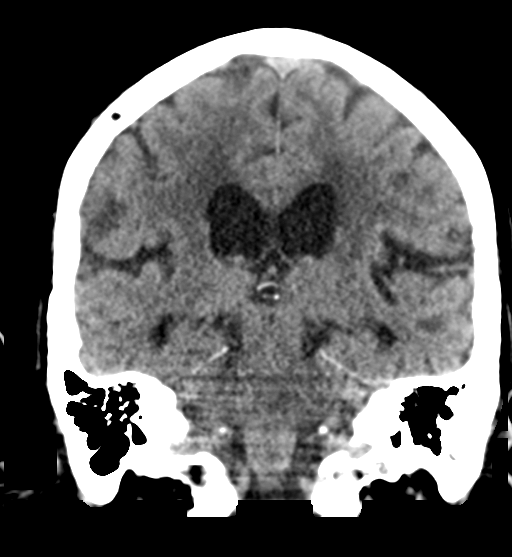

[Series 5: sagittal soft tissue · sagittal · 0.36mm/px · 3 of 56 slices shown]
[im 19/56  brain]
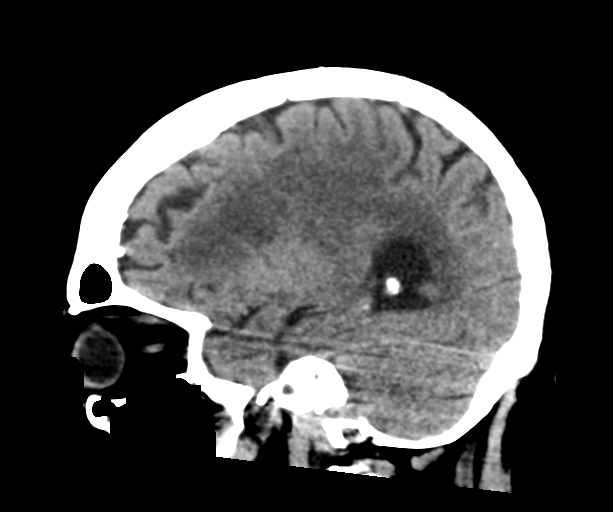
[im 28/56  brain]
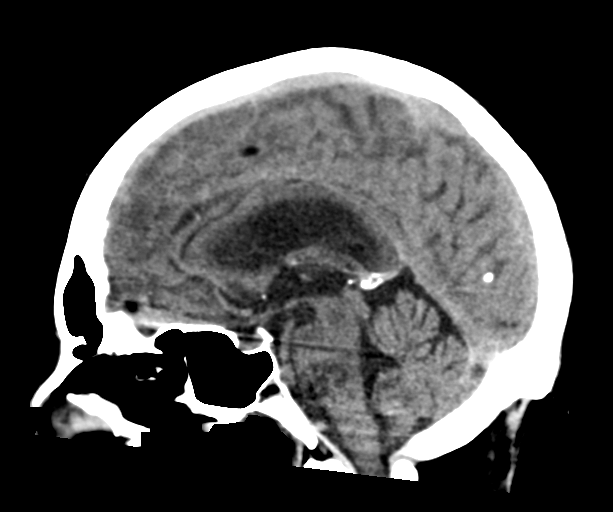
[im 37/56  brain]
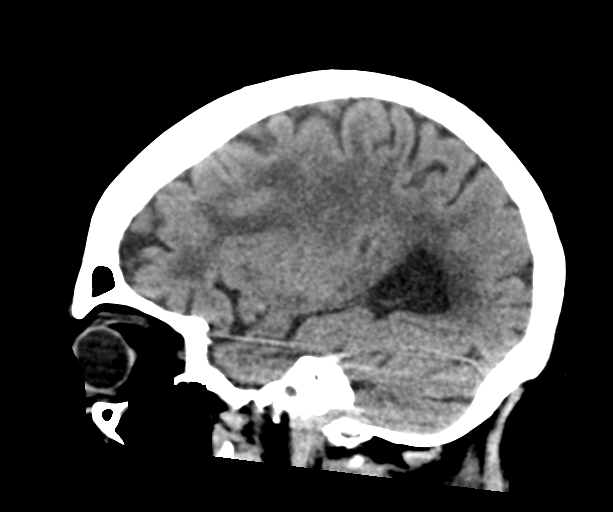

[15 of 47 positions shown; findings below may reference images not displayed]

FINDINGS: Brain: Moderate atrophy and white matter disease is stable. Basal
ganglia are intact. Insular ribbon is normal bilaterally. No acute
or focal cortical abnormalities are present.

The ventricles are of proportionate to the degree of atrophy. No
significant extraaxial fluid collection is present.

The brainstem and cerebellum are within normal limits.

Vascular: Atherosclerotic calcifications are again noted within the
cavernous internal carotid arteries. There is no hyperdense vessel.

Skull: Calvarium is intact. No focal lytic or blastic lesions are
present.

Sinuses/Orbits: Chronic right sphenoid sinus disease is present. The
paranasal sinuses are otherwise clear. Bilateral lens replacements
are noted. Globes and orbits are otherwise unremarkable.

ASPECTS ([place] Stroke Program Early CT Score)

- Ganglionic level infarction (caudate, lentiform nuclei, internal
capsule, insula, M1-M3 cortex): [DATE]

- Supraganglionic infarction (M4-M6 cortex): [DATE]

Total score (0-10 with 10 being normal): [DATE]
IMPRESSION: 1. No acute intracranial abnormality or significant interval change.
2. Stable moderate atrophy and white matter disease.
3. ASPECTS is [DATE]

These results were called by telephone at the time of interpretation
on 02/17/2019 at [DATE] to Dr. GUY CHRISTIAN AURIAC , who verbally
acknowledged these results.

## 2021-03-21 IMAGING — DX CHEST  1 VIEW
1 series · 1 of 1 positions shown · non-contrast
Comparison: 02/12/2019

CLINICAL DATA: Altered mental status.

EXAM:
CHEST  1 VIEW

[chest pa]
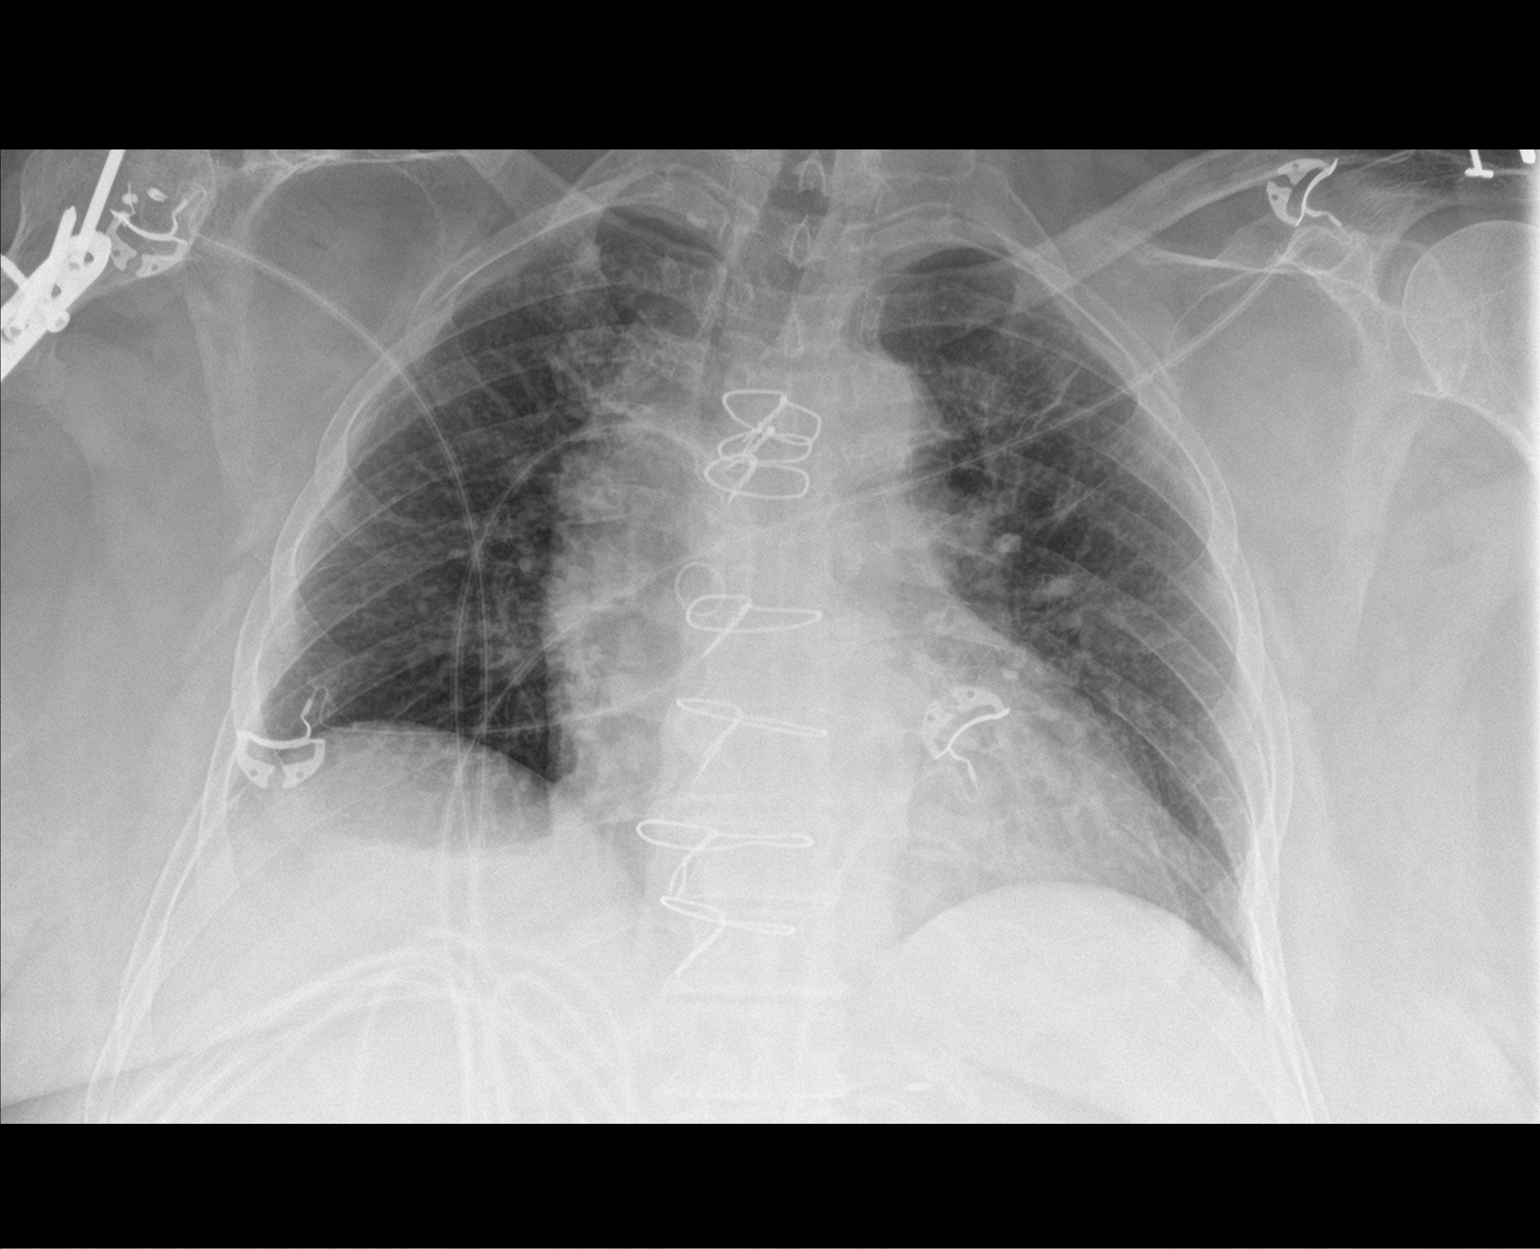

[1 of 1 positions shown; findings below may reference images not displayed]

FINDINGS: Postsurgical changes from CABG.

The cardiac silhouette is enlarged. There is an apparent widening of
the mediastinum, when compared to patient's prior radiographs.

There is no evidence of focal airspace consolidation, or
pneumothorax. Possible right pleural effusion.

Osseous structures are without acute abnormality. Soft tissues are
grossly normal.
IMPRESSION: 1. Apparent widening of the mediastinum, when compared to patient's
prior radiographs. This may be due to poor inspiratory effort,
however vascular anomaly can not be excluded. Please correlate to
patient's symptomatology. If the patient displays signs of acute
aortic syndrome, then CT angiogram of the chest should be performed.
Otherwise PA and lateral radiograph the chest may be considered.
2. Possible right pleural effusion.

These results were called by telephone at the time of interpretation
on 03/17/2019 at [DATE] to Dr. KEYSE GLASENAPP , who verbally
acknowledged these results.

## 2021-03-21 IMAGING — CT CT CERVICAL SPINE WITHOUT CONTRAST
3 of 11 series · 7 of 33 positions shown, 8 images · non-contrast
Comparison: Brain MRI 02/17/2019, cervical spine MRI 02/18/2019

CLINICAL DATA: Explain confusion.

EXAM:
CT HEAD WITHOUT CONTRAST
CT CERVICAL SPINE WITHOUT CONTRAST
TECHNIQUE: Multidetector CT imaging of the head and cervical spine was
performed following the standard protocol without intravenous
contrast. Multiplanar CT image reconstructions of the cervical spine
were also generated.

[Series 14: c spine soft · axial · 0.29mm/px · z∈[-88,+8]mm · 3 of 97 slices shown]
[im 25/97  soft-tissue]
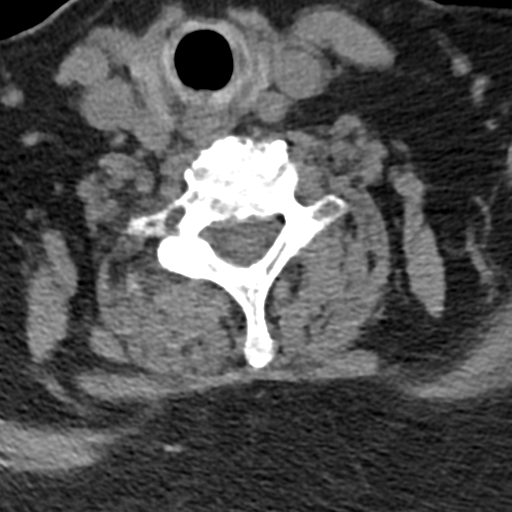
[im 49/97  soft-tissue]
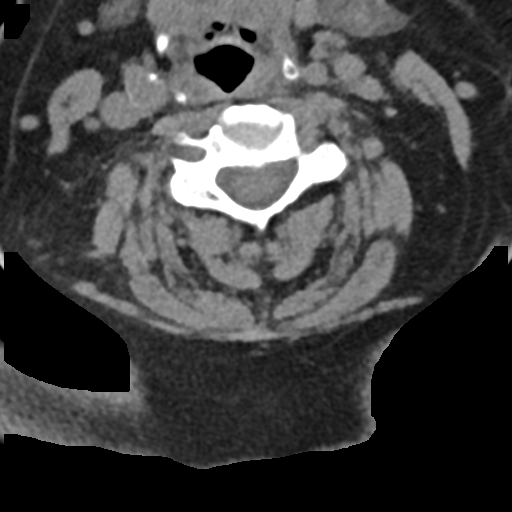
[im 73/97  soft-tissue]
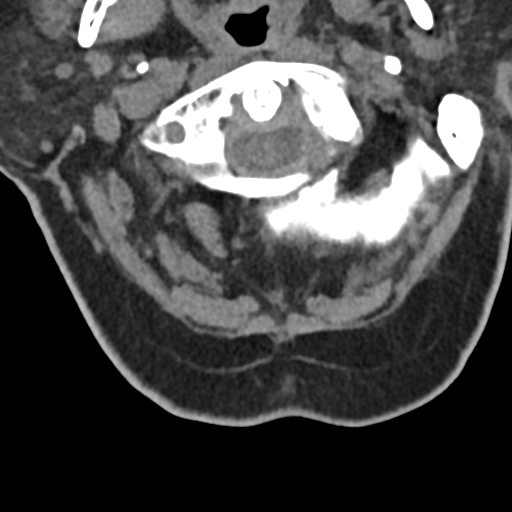

[Series 15: sagittal bone · sagittal · 0.28mm/px · 1 of 61 slices shown]
[im 31/61  bone]
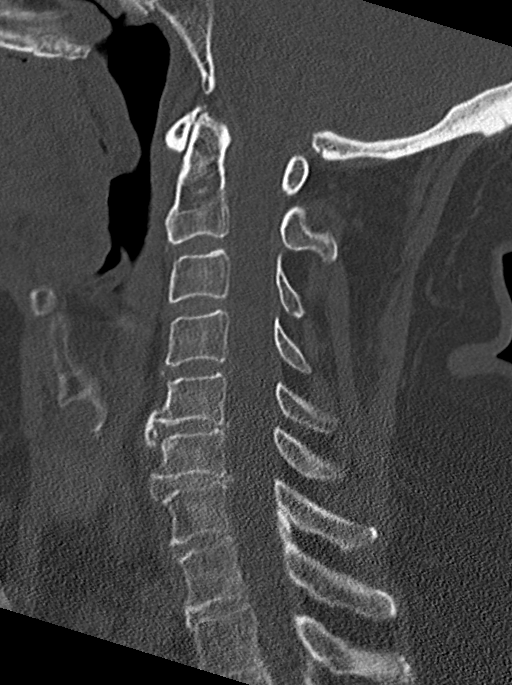

[Series 17: orthogonal bone · axial · 0.21mm/px · z∈[-111,-31]mm · 3 of 91 slices shown, 4 images]
[im 23/91  soft-tissue]
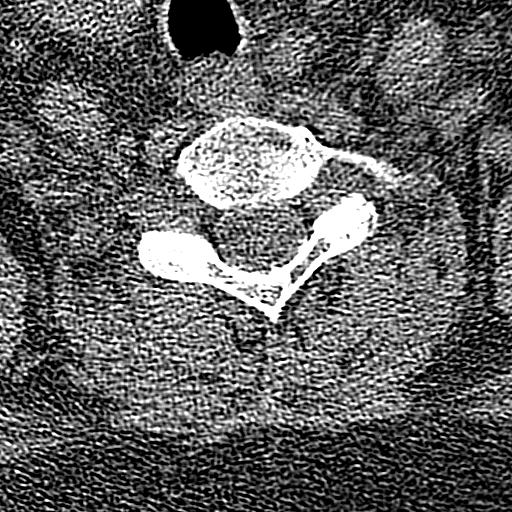
[im 23/91  bone]
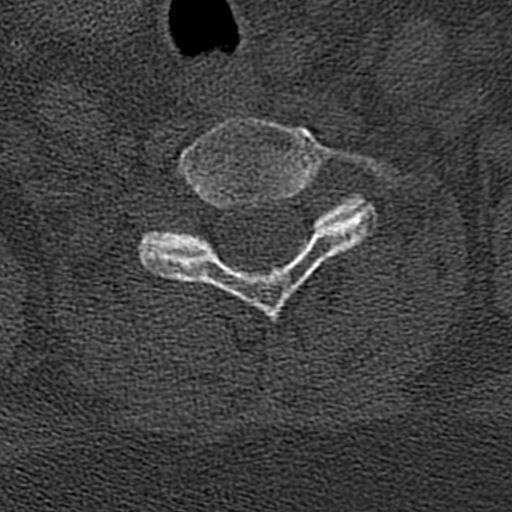
[im 46/91  bone]
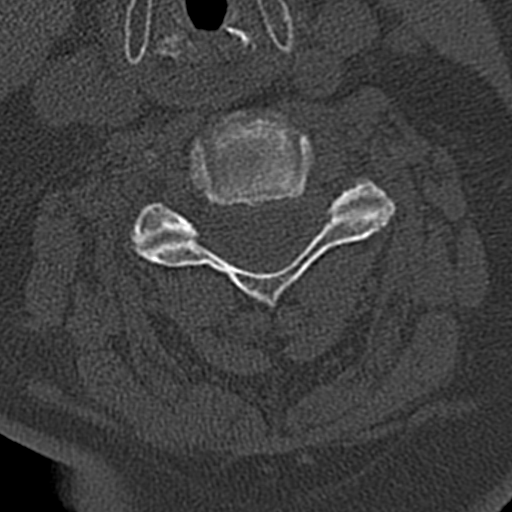
[im 68/91  bone]
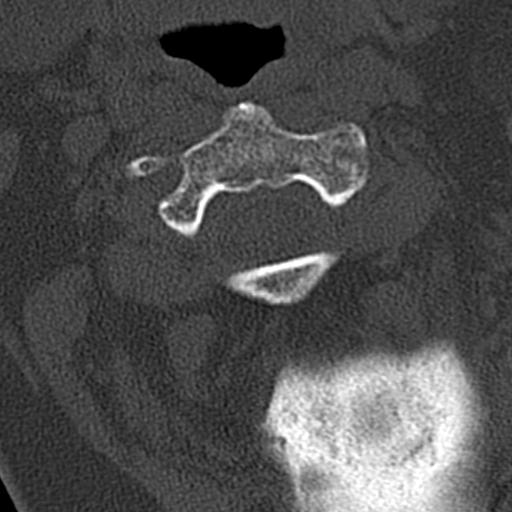

[7 of 33 positions shown; findings below may reference images not displayed]

FINDINGS: CT HEAD FINDINGS

Brain: No evidence of acute infarction, hemorrhage, hydrocephalus,
extra-axial collection or mass lesion/mass effect. Moderate brain
parenchymal volume loss and deep white matter microangiopathy.

Vascular: Calcific atherosclerotic disease of the intra cavernous
carotid arteries.

Skull: Normal. Negative for fracture or focal lesion.

Sinuses/Orbits: No acute finding.

Other: None

CT CERVICAL SPINE FINDINGS

Alignment: Normal.

Skull base and vertebrae: No acute fracture. No primary bone lesion
or focal pathologic process.

Soft tissues and spinal canal: No prevertebral fluid or swelling. No
visible canal hematoma.

Disc levels:  Multilevel osteoarthritic changes.

Upper chest: Negative.

Other:
IMPRESSION: 1. No acute intracranial abnormality.
2. Atrophy, chronic microvascular disease.
3. No evidence of acute traumatic injury to cervical spine.
4. Multilevel osteoarthritic changes of the cervical spine.

## 2021-03-21 IMAGING — CT CT ANGIO CHEST-ABD-PELV FOR DISSECTION W/ AND WO/W CM
2 of 7 series · 12 of 46 positions shown, 14 images · IV contrast (Isovue)
Comparison: No prior chest CT.  CT abdomen and pelvis 05/19/2017.

CLINICAL DATA: 82-year-old female complaining of back pain. Recent
history of fall.

EXAM:
CT ANGIOGRAPHY CHEST, ABDOMEN AND PELVIS
TECHNIQUE: Multidetector CT imaging through the chest, abdomen and pelvis was
performed using the standard protocol during bolus administration of
intravenous contrast. Multiplanar reconstructed images and MIPs were
obtained and reviewed to evaluate the vascular anatomy.
CONTRAST:  100mL OMNIPAQUE IOHEXOL 350 MG/ML SOLN

[Series 5: dissection axial arterial · axial · arterial · 0.69mm/px · z∈[+657,+1182]mm · 9 of 201 slices shown, 11 images]
[im 13/201  soft-tissue]
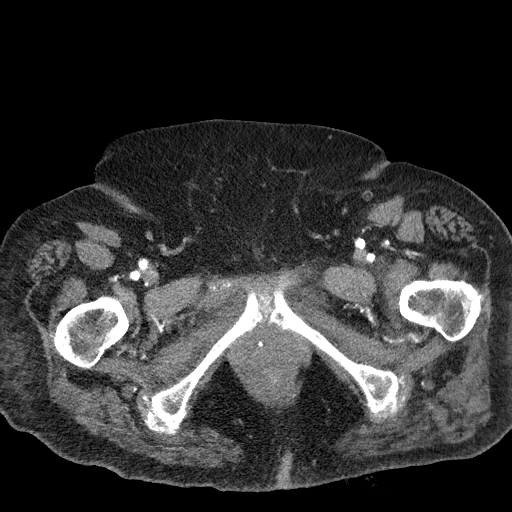
[im 13/201  bone]
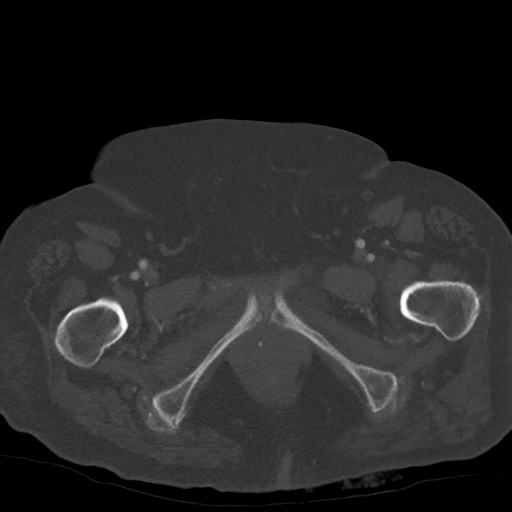
[im 38/201  soft-tissue]
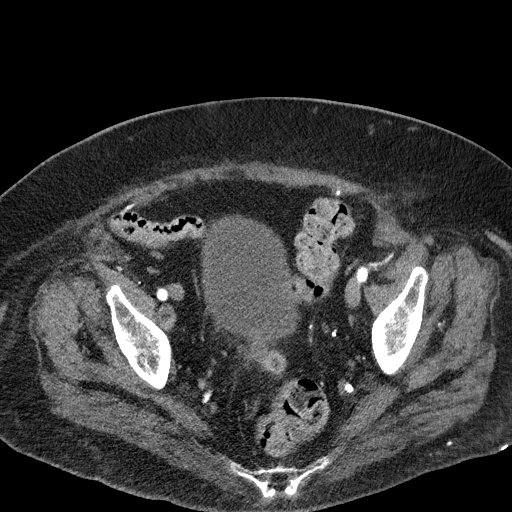
[im 63/201  soft-tissue]
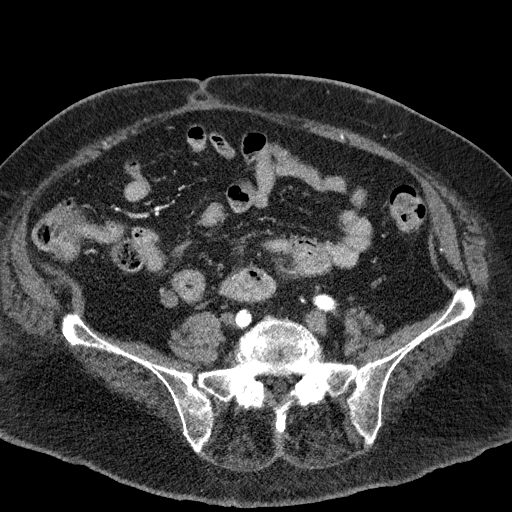
[im 76/201  soft-tissue]
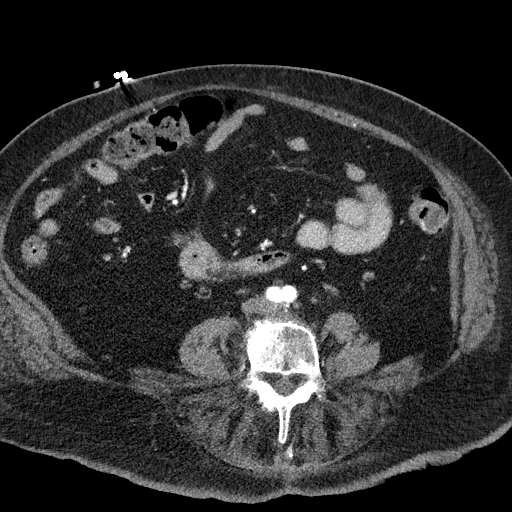
[im 101/201  soft-tissue]
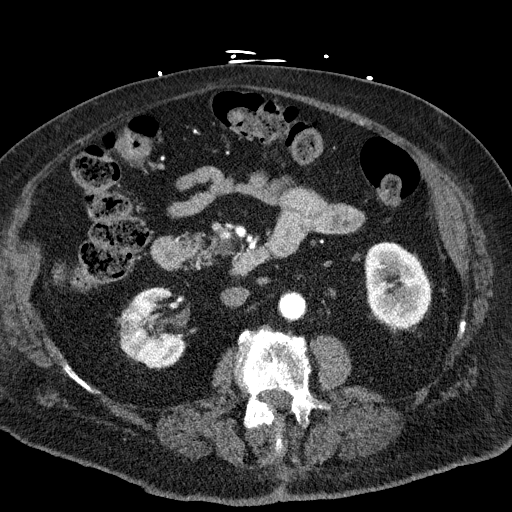
[im 126/201  soft-tissue]
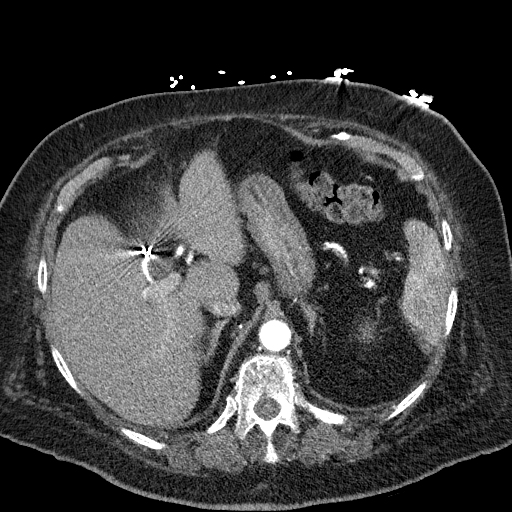
[im 138/201  soft-tissue]
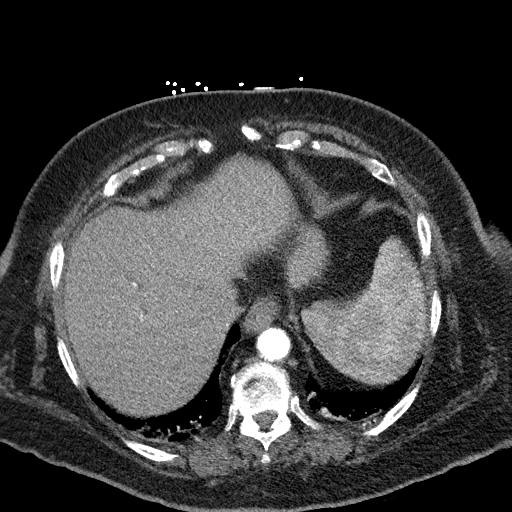
[im 163/201  soft-tissue]
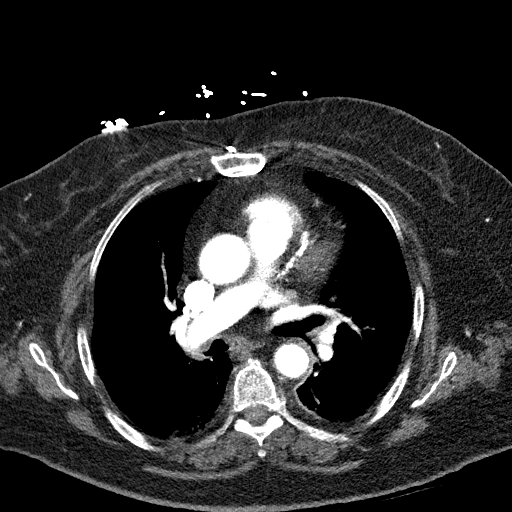
[im 188/201  soft-tissue]
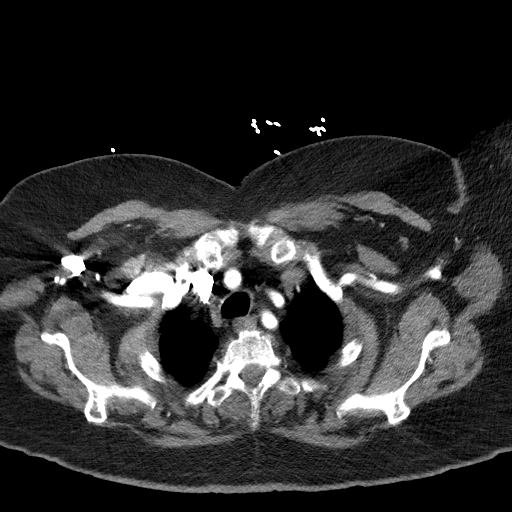
[im 188/201  bone]
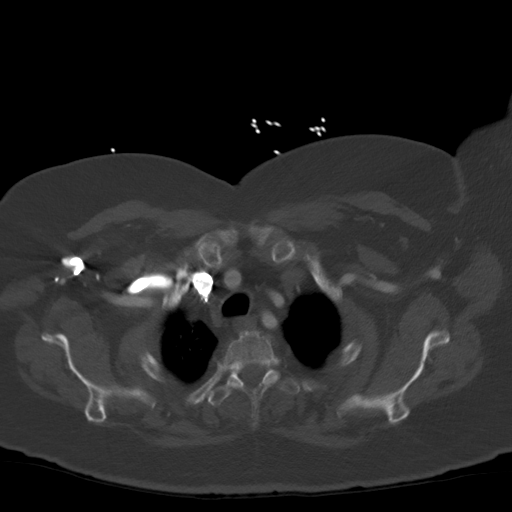

[Series 9: coronals · coronal · 0.76mm/px · 3 of 147 slices shown]
[im 37/147  soft-tissue]
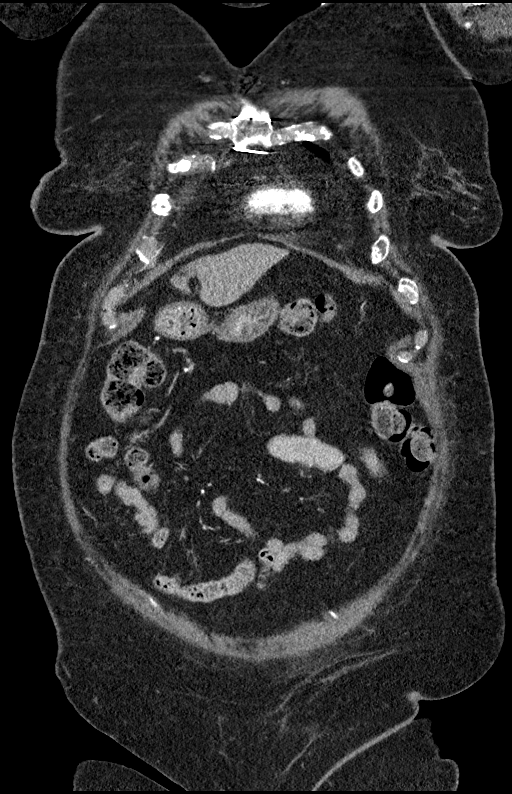
[im 74/147  soft-tissue]
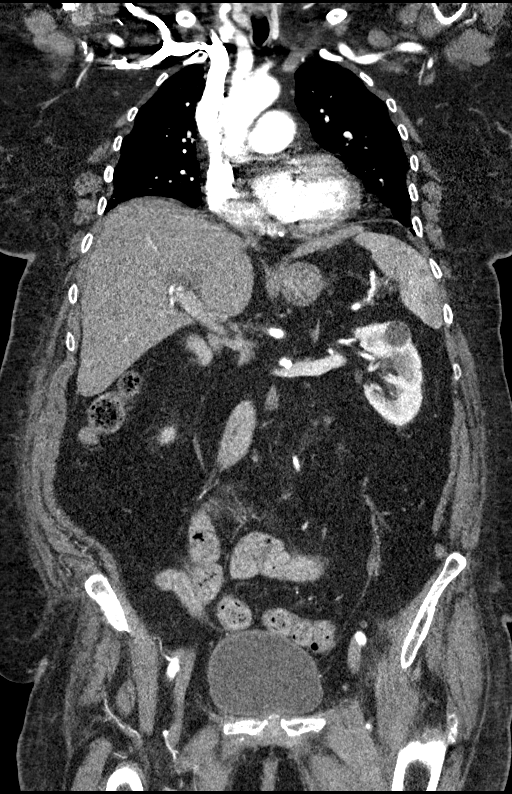
[im 110/147  soft-tissue]
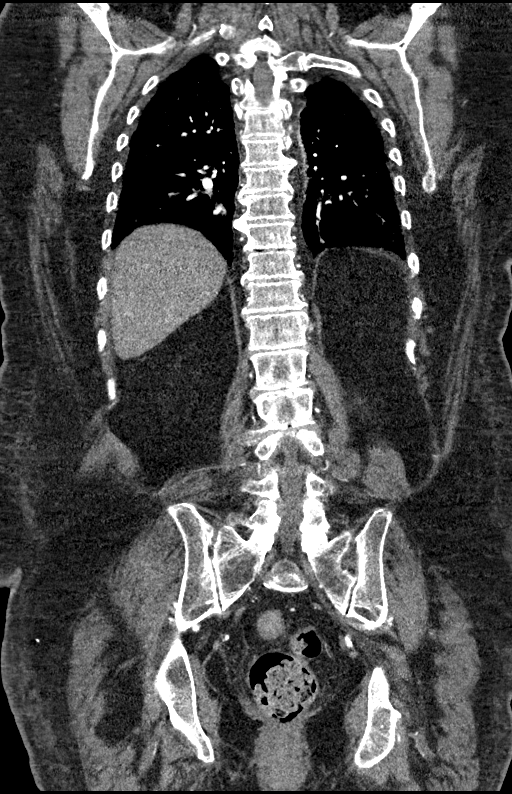

[12 of 46 positions shown; findings below may reference images not displayed]

FINDINGS: CTA CHEST FINDINGS

Cardiovascular: Heart size is mildly enlarged. There is no
significant pericardial fluid, thickening or pericardial
calcification. There is aortic atherosclerosis, as well as
atherosclerosis of the great vessels of the mediastinum and the
coronary arteries, including calcified atherosclerotic plaque in the
left main, left anterior descending, left circumflex and right
coronary arteries. Status post median sternotomy for CABG including
[REDACTED] to the LAD. On precontrast images there is no crescentic high
attenuation associated with the wall of the thoracic aorta to
suggest acute intramural hemorrhage. No aortic aneurysm or
dissection. Calcifications of the aortic valve and mitral annulus.

Mediastinum/Nodes: No pathologically enlarged mediastinal or hilar
lymph nodes. Esophagus is normal in appearance. No axillary
lymphadenopathy.

Lungs/Pleura: Scattered areas of linear scarring are noted in the
lungs bilaterally, most evident near the lung apices and in the lung
bases. Areas of mild dependent subsegmental atelectasis also noted
in the lower lobes of the lungs bilaterally. No acute consolidative
airspace disease. No pleural effusions. No definite suspicious
appearing pulmonary nodules or masses are noted.

Musculoskeletal: Median sternotomy wires. There are no aggressive
appearing lytic or blastic lesions noted in the visualized portions
of the skeleton.

Review of the MIP images confirms the above findings.

CTA ABDOMEN AND PELVIS FINDINGS

VASCULAR

Aorta: Normal caliber aorta without aneurysm, dissection, vasculitis
or significant stenosis.

Celiac: Patent without evidence of aneurysm, dissection, vasculitis
or significant stenosis.

SMA: Patent without evidence of aneurysm, dissection, vasculitis or
significant stenosis.

Renals: Both renal arteries are patent without evidence of aneurysm,
dissection, vasculitis, fibromuscular dysplasia or significant
stenosis.

IMA: Patent without evidence of aneurysm, dissection, vasculitis or
significant stenosis.

Inflow: Patent without evidence of aneurysm, dissection, vasculitis
or significant stenosis.

Veins: No obvious venous abnormality within the limitations of this
arterial phase study.

Review of the MIP images confirms the above findings.

NON-VASCULAR

Hepatobiliary: No suspicious cystic or solid hepatic lesions. Status
post cholecystectomy. No intrahepatic biliary ductal dilatation.
Common bile duct measures 7 mm in the porta hepatis, within normal
limits for this post cholecystectomy patient.

Pancreas: No pancreatic mass. No pancreatic ductal dilatation. No
pancreatic or peripancreatic fluid or inflammatory changes.

Spleen: Unremarkable.

Adrenals/Urinary Tract: Low-attenuation lesions in both kidneys,
compatible with simple cysts, largest of which measures 2.1 cm in
the upper pole the left kidney. Multifocal cortical thinning in the
right kidney, presumably chronic scarring. No definite suspicious
renal lesions. No hydroureteronephrosis. Urinary bladder is normal
in appearance. Bilateral adrenal glands are normal in appearance.

Stomach/Bowel: Normal appearance of the stomach. No pathologic
dilatation of small bowel or colon. The appendix is not confidently
identified and may be surgically absent. Regardless, there are no
inflammatory changes noted adjacent to the cecum to suggest the
presence of an acute appendicitis at this time.

Lymphatic: No lymphadenopathy noted in the abdomen or pelvis.

Reproductive: Status post hysterectomy. Ovaries are not confidently
identified may be surgically absent or trophic.

Other: No significant volume of ascites.  No pneumoperitoneum.

Musculoskeletal: Median sternotomy wires. No acute displaced
fractures or aggressive appearing lytic or blastic lesions are noted
in the visualized portions of the skeleton.

Review of the MIP images confirms the above findings.
IMPRESSION: 1. No acute findings are noted in the chest, abdomen or pelvis to
account for the patient's symptoms. Specifically, no evidence of
acute aortic syndrome.
2. Mild cardiomegaly.
3. Aortic atherosclerosis, in addition to left main and 3 vessel
coronary artery disease. Status post median sternotomy for CABG
including [REDACTED] to the LAD.
4. There are calcifications of the aortic valve and mitral annulus.
Echocardiographic correlation for evaluation of potential valvular
dysfunction may be warranted if clinically indicated.
5. Additional incidental findings, as above.

Aortic Atherosclerosis ([id number]-HJJ.J).

## 2021-03-21 IMAGING — DX DG HIP (WITH OR WITHOUT PELVIS) 5+V BILAT
5 series · 5 of 5 positions shown · non-contrast
Comparison: None.

CLINICAL DATA: Bilateral hip pain

EXAM:
DG HIP (WITH OR WITHOUT PELVIS) 5+V BILAT

[pelvis ap]
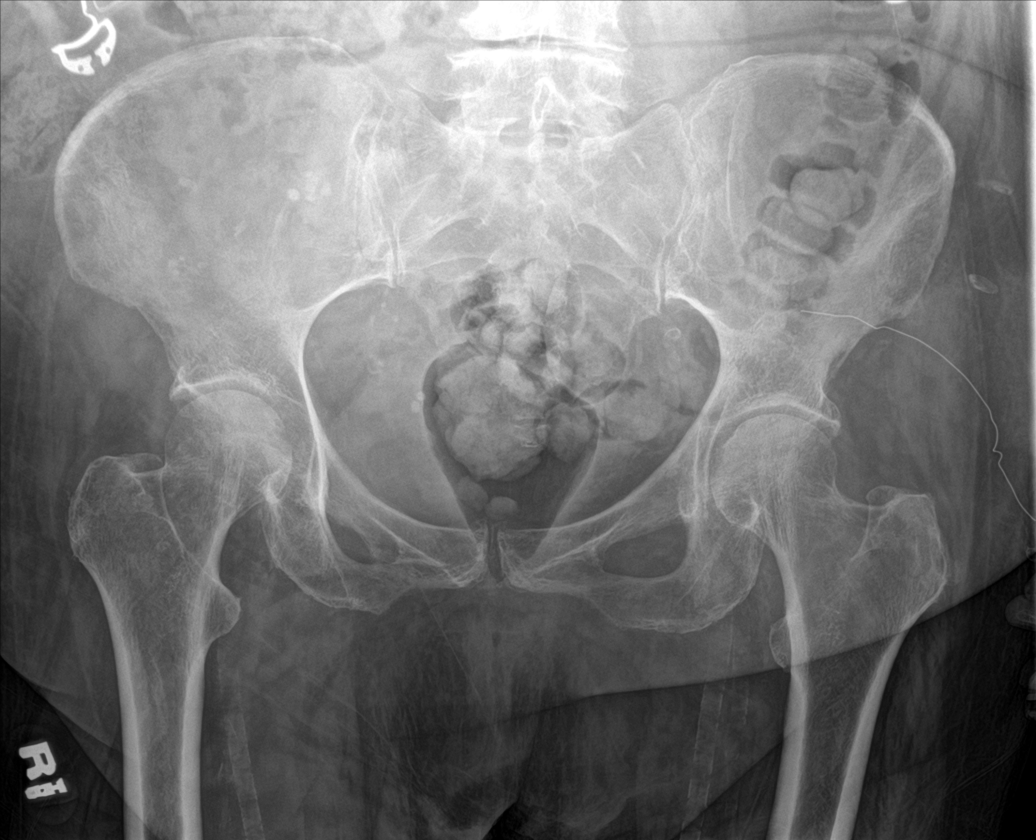

[hip ap (1 of 2)]
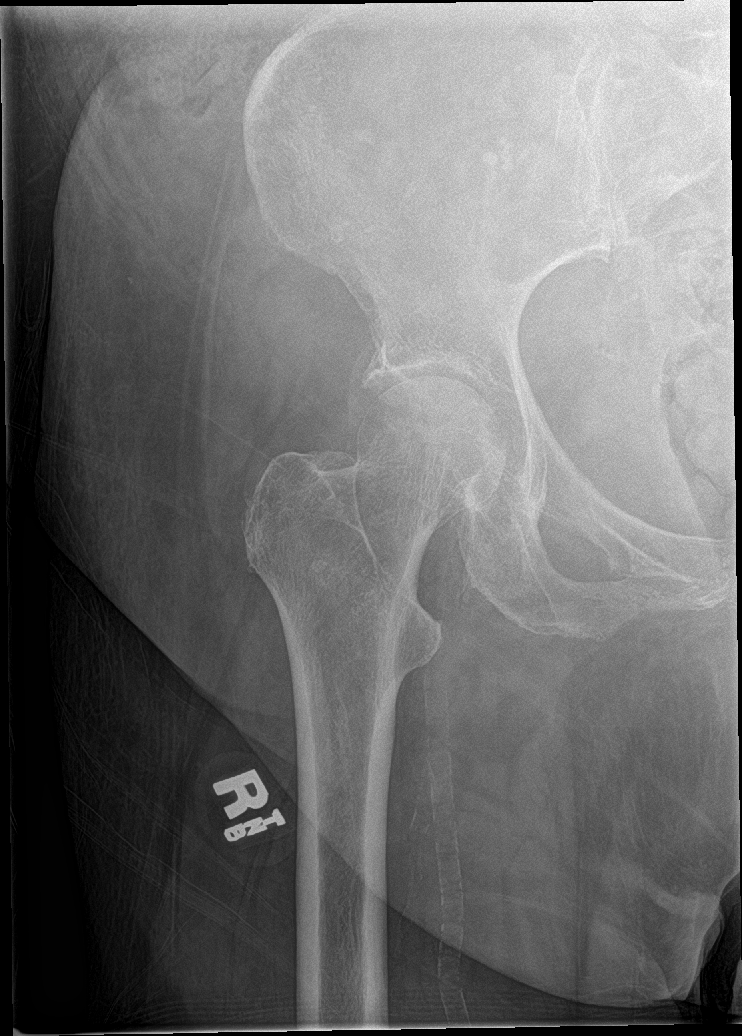

[hip lat (1 of 2)]
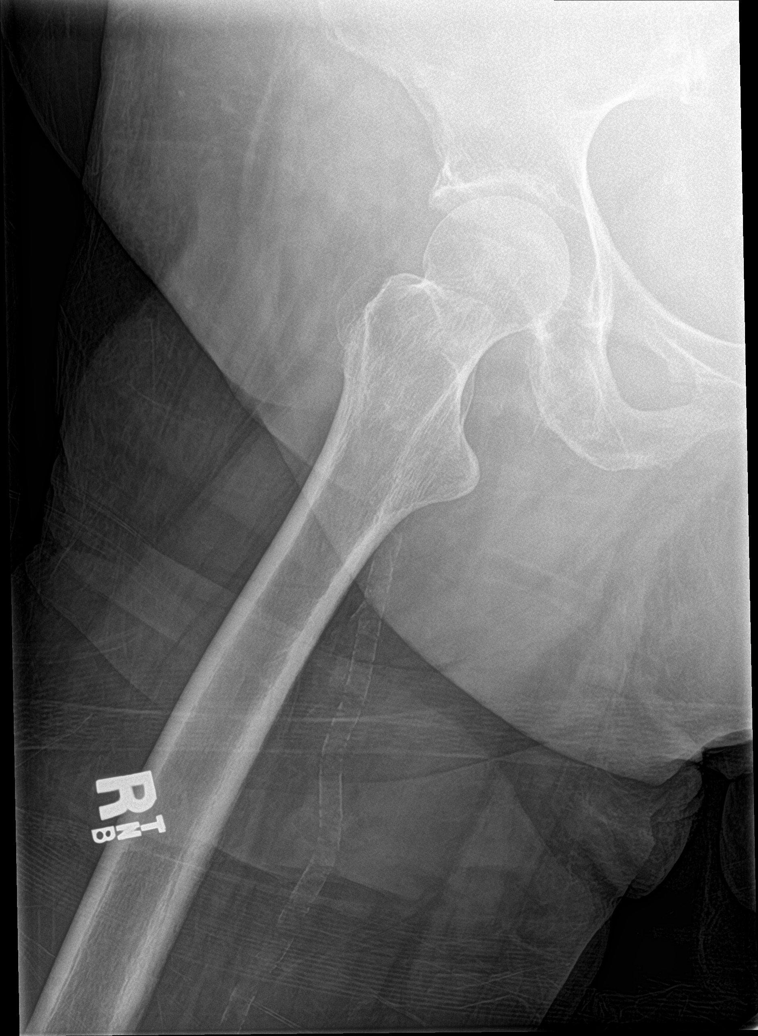

[hip ap (2 of 2)]
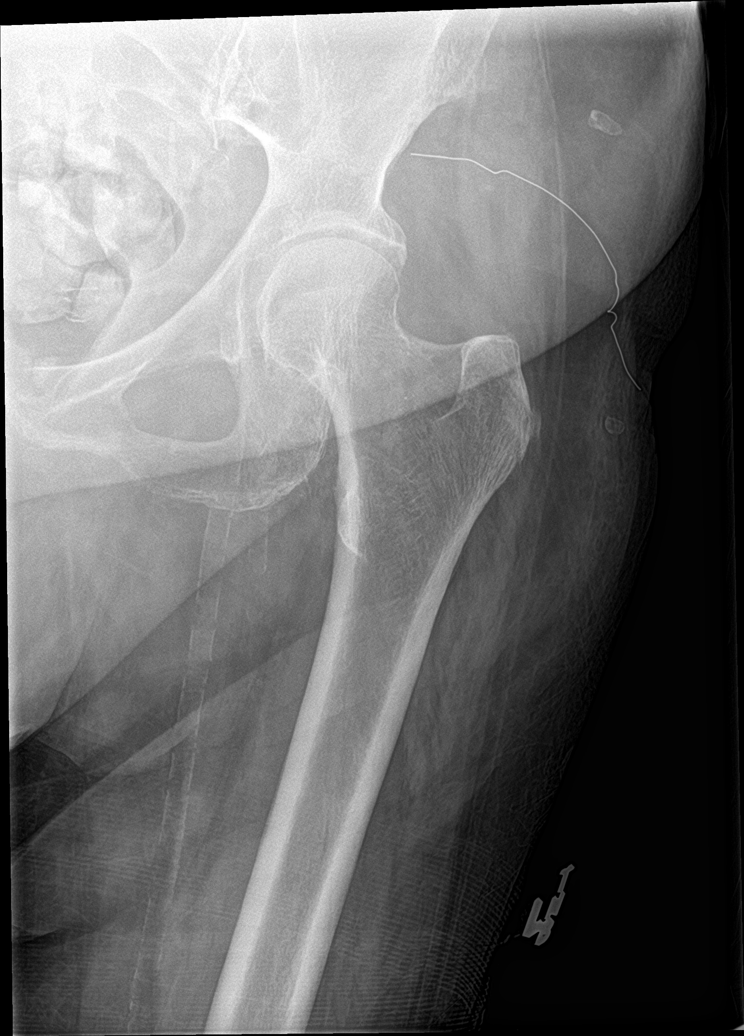

[hip lat (2 of 2)]
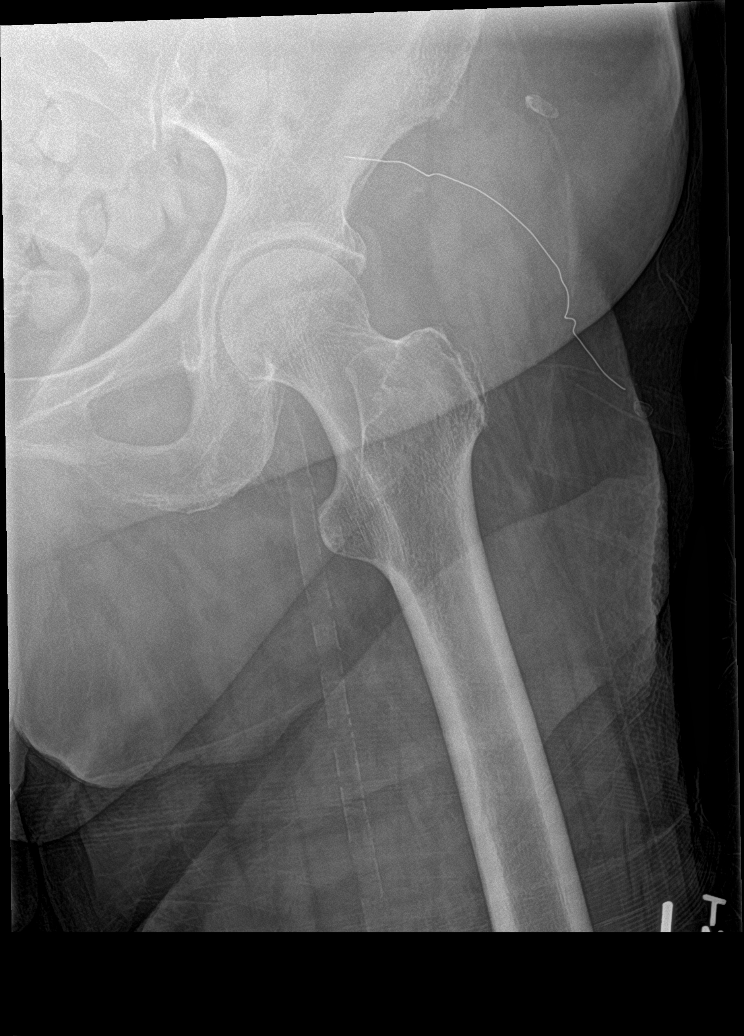

[5 of 5 positions shown; findings below may reference images not displayed]

FINDINGS: There is no evidence of hip fracture or dislocation. There is no
evidence of arthropathy or other focal bone abnormality. Peripheral
vascular atherosclerotic disease.
IMPRESSION: No acute osseous injury of the bilateral hips.

## 2021-03-23 IMAGING — MR MRI OF THE LEFT HIP WITHOUT CONTRAST
4 of 6 series · 13 of 40 positions shown · non-contrast
Comparison: Plain films left hip 03/17/2019.

CLINICAL DATA: Left hip pain and difficulty standing. Injury
history is uncertain.

EXAM:
MR OF THE LEFT HIP WITHOUT CONTRAST
TECHNIQUE: Multiplanar, multisequence MR imaging was performed. No intravenous
contrast was administered.

[Series 2: t1_tse_cor · coronal · 4.0mm · 0.41mm/px · 4 of 38 slices shown]
[im 1/38]
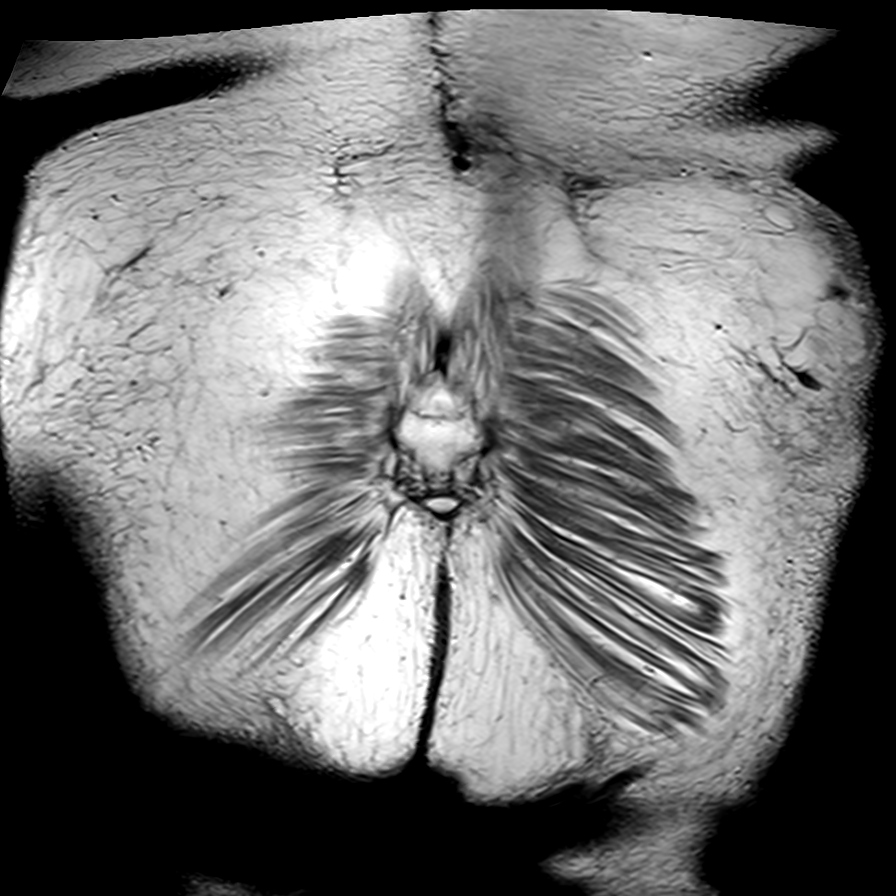
[im 6/38]
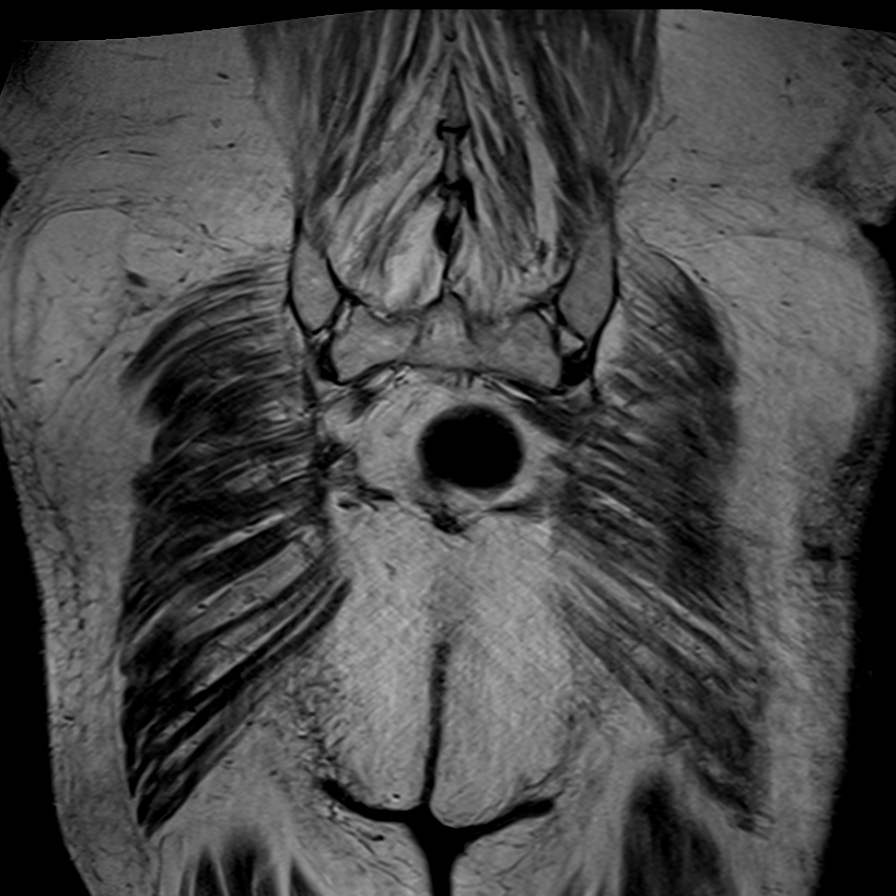
[im 22/38]
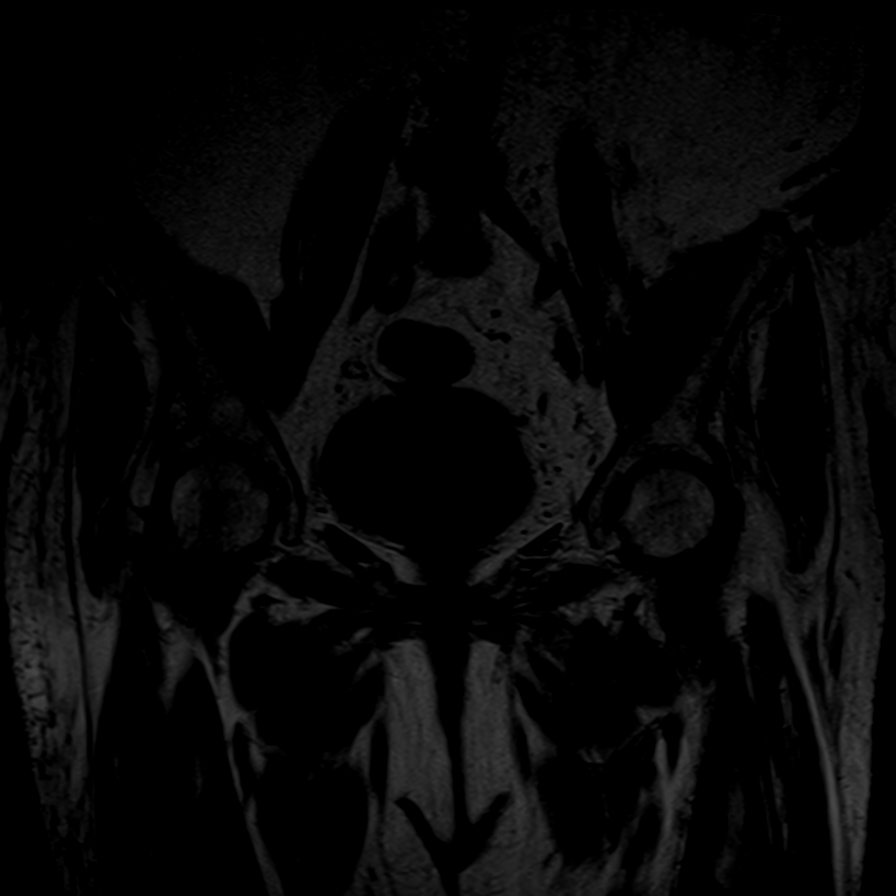
[im 32/38]
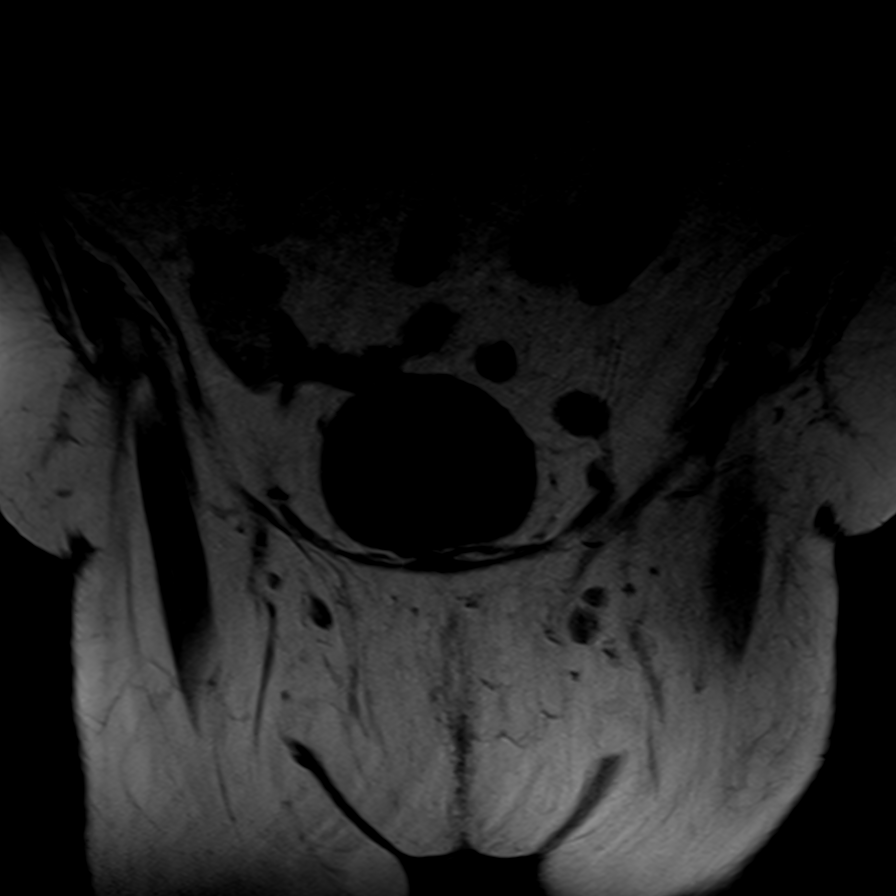

[Series 3: ir cor · coronal · 4.0mm · 1.14mm/px · 3 of 38 slices shown]
[im 6/38]
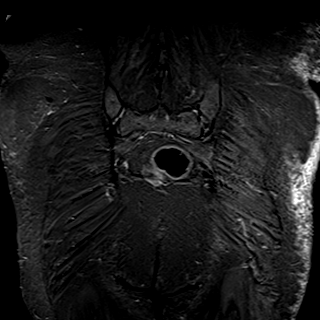
[im 22/38]
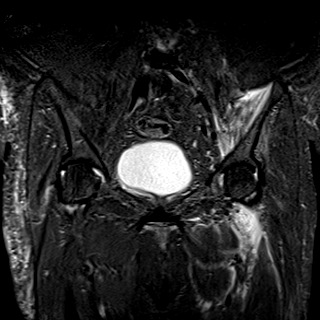
[im 32/38]
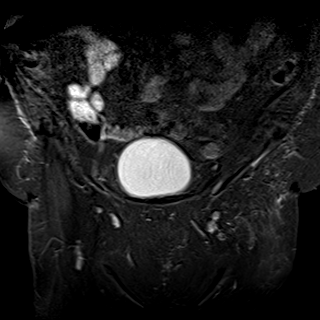

[Series 4: t2fs tra affected · axial · 4.0mm · 0.36mm/px · z∈[-75,+55]mm · 3 of 27 slices shown]
[im 1/27]
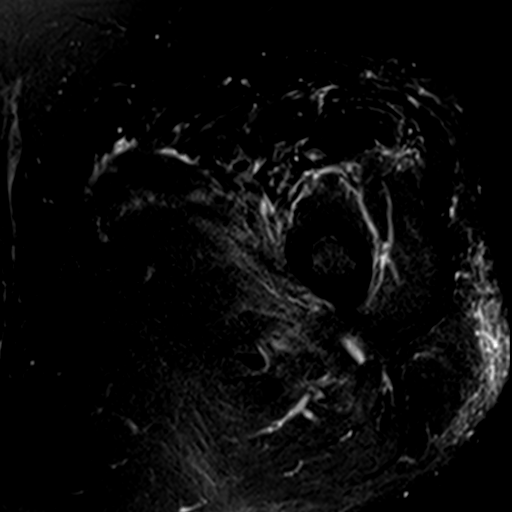
[im 14/27]
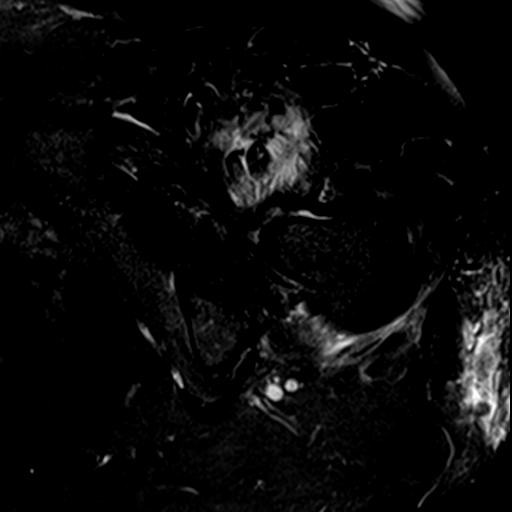
[im 27/27]
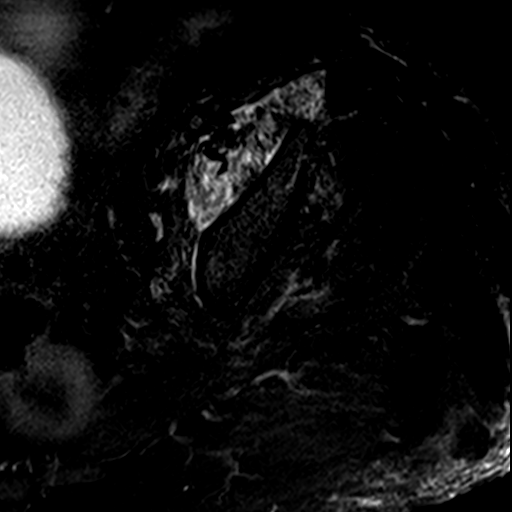

[Series 5: pdfs sag affected · sagittal · 4.0mm · 0.35mm/px · 3 of 25 slices shown]
[im 1/25]
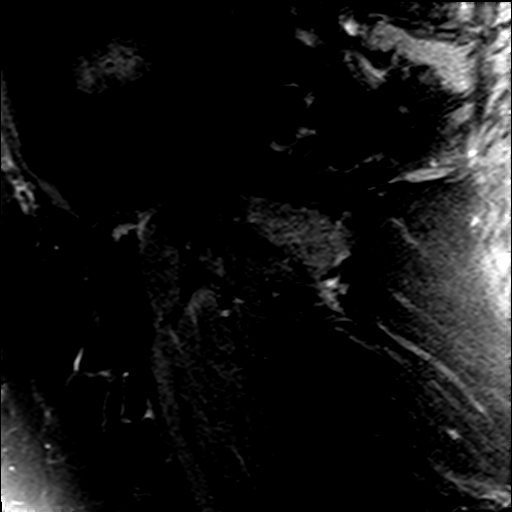
[im 13/25]
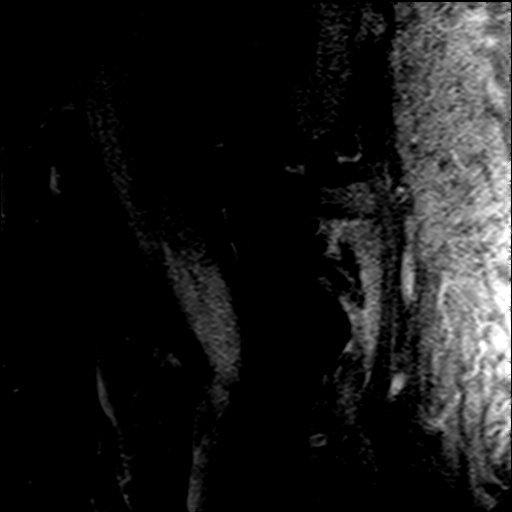
[im 25/25]
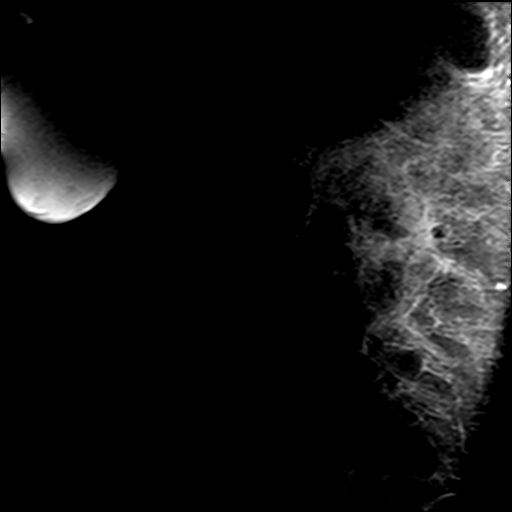

[13 of 40 positions shown; findings below may reference images not displayed]

FINDINGS: Bones: No fracture, dislocation or focal lesion. No worrisome
lesion. Lower lumbar spondylosis with associated degenerative
endplate signal change is noted.

Articular cartilage and labrum

Articular cartilage:  Appears preserved.

Labrum:  The anterior, superior labrum is degenerated.

Joint or bursal effusion

Joint effusion:  None.

Bursae: Negative.

Muscles and tendons

Muscles and tendons: The patient has a complete tear of the left
iliopsoas tendon from the lesser trochanter. The tendon is retracted
3-4 cm. Edema is seen about the distal tendon and tracks into the
muscle belly most consistent with acute or early subacute tear.
There is also some edema in the left adductor magnus compatible with
strain without tear. Tendinosis of the hamstrings at their origins
is worse on the left. Partial tear of the left hamstrings off the
ischial tuberosity measures 1.4 cm AP with little to no retraction.

Other findings

Miscellaneous: Imaged intrapelvic contents demonstrate no acute
abnormality.
IMPRESSION: Negative for fracture or dislocation.

Complete tear of the left ileus psoas tendon from the lesser
tuberosity with 3-4 cm of retraction appears acute or early
subacute.

Chronic appearing tendinosis and partial tear of the left hamstrings
from their origin.

Edema in the left adductor magnus compatible with strain without
tear.

## 2021-04-04 IMAGING — DX PELVIS - 1-2 VIEW
1 series · 1 of 1 positions shown · non-contrast
Comparison: Pelvis and hip radiographs 03/17/2019. Pelvic MRI
03/19/2019

CLINICAL DATA: Fall 2 days ago with left pelvis/hip and femur pain.
Recent fall leading to iliopsoas tendon tear.

EXAM:
PELVIS - 1-2 VIEW

[pelvis ap]
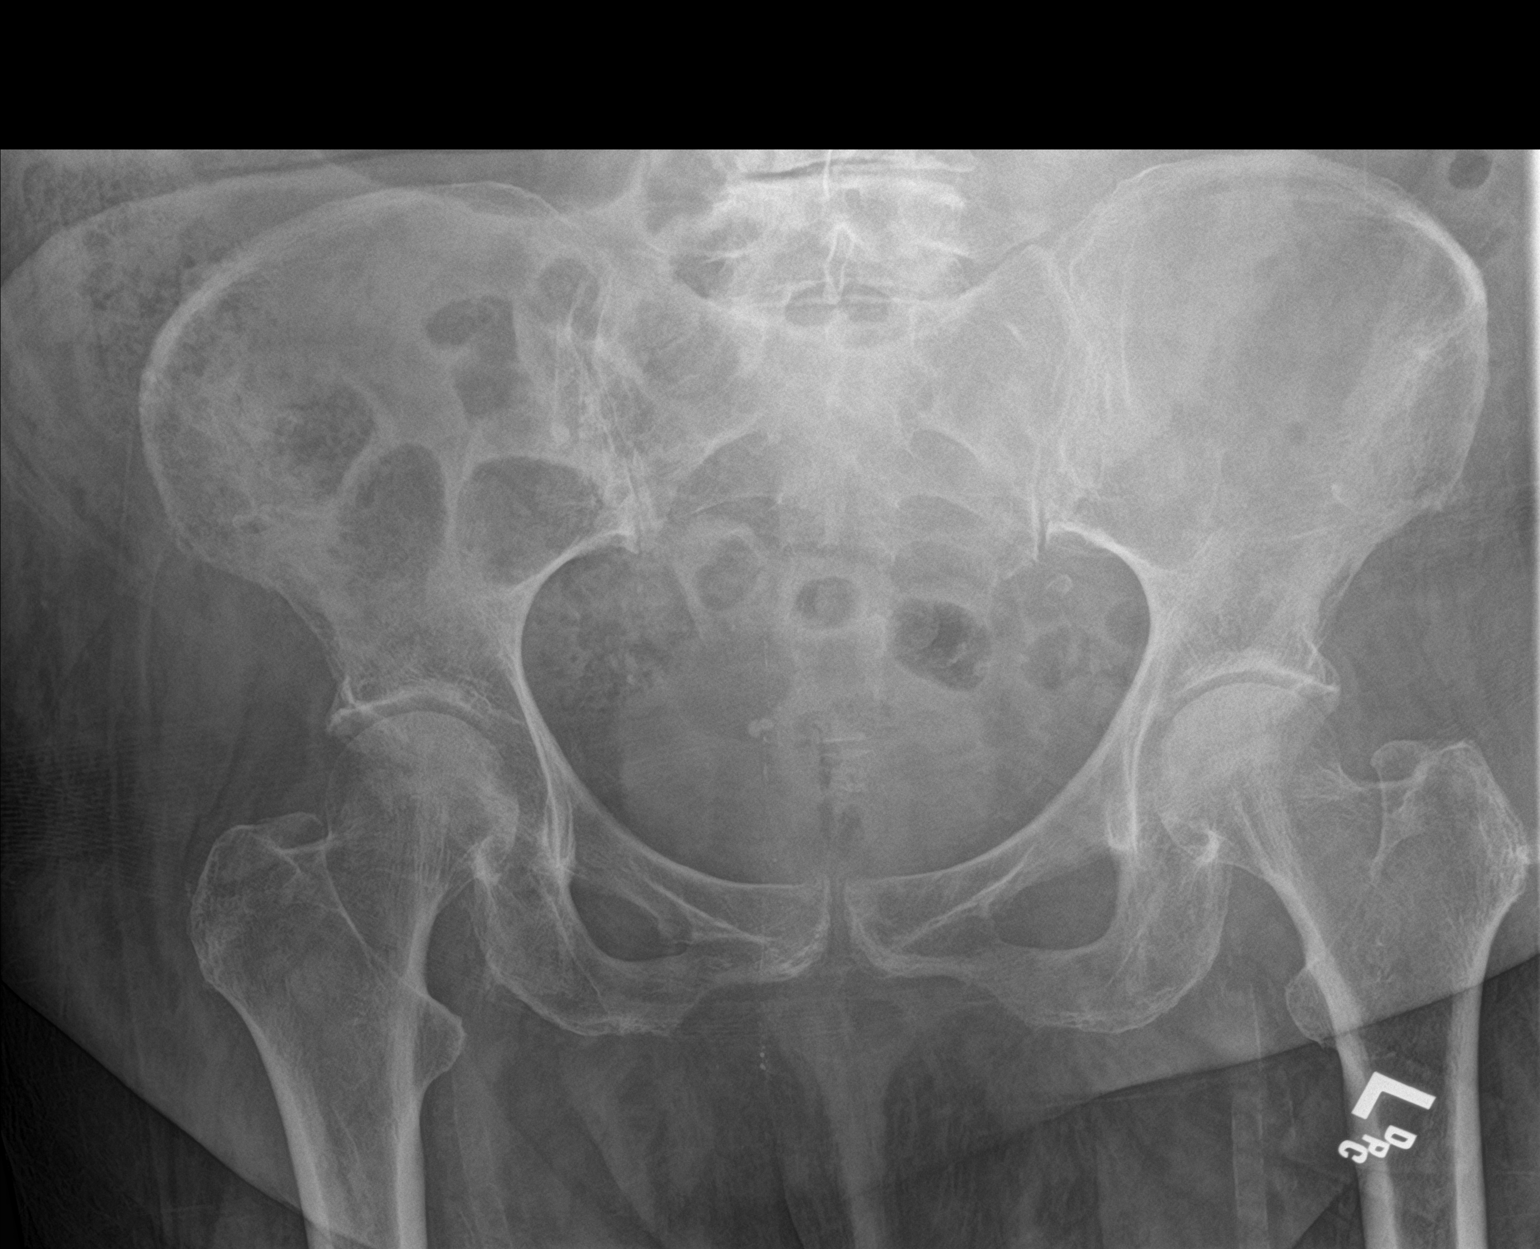

[1 of 1 positions shown; findings below may reference images not displayed]

FINDINGS: The cortical margins of the bony pelvis are intact. No fracture.
Pubic symphysis and sacroiliac joints are congruent. Both femoral
heads are well-seated in the respective acetabula. There are
vascular calcifications.
IMPRESSION: No evidence of pelvic fracture.

## 2021-04-04 IMAGING — CT CT HEAD WITHOUT CONTRAST
3 series · 15 of 47 positions shown, 18 images · non-contrast
Comparison: Prior CT from 03/17/2019.

CLINICAL DATA: Initial evaluation for acute trauma, recent fall.

EXAM:
CT HEAD WITHOUT CONTRAST
TECHNIQUE: Contiguous axial images were obtained from the base of the skull
through the vertex without intravenous contrast.

[Series 2: head trauma wo · axial · 0.42mm/px · z∈[+57,+182]mm · 9 of 30 slices shown, 12 images]
[im 3/30  brain]
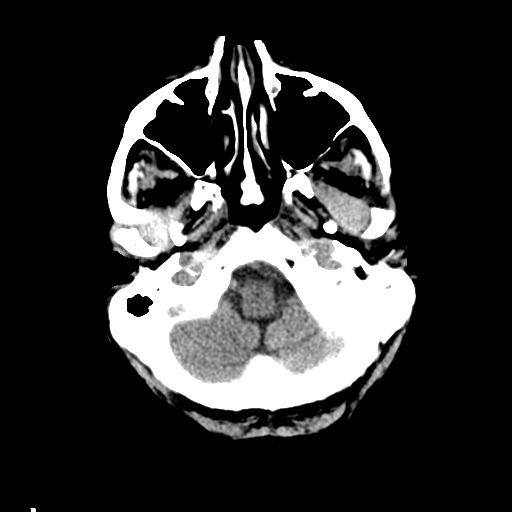
[im 3/30  bone]
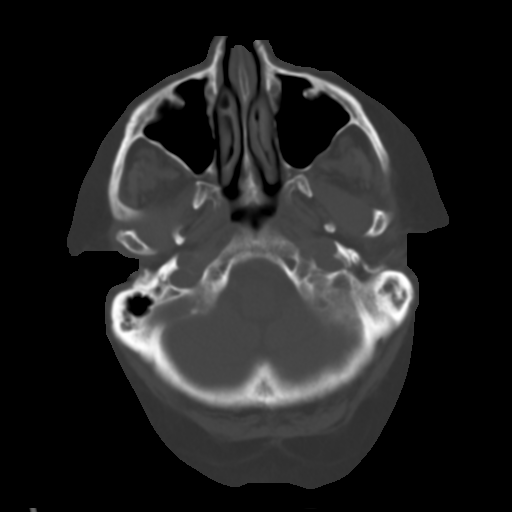
[im 6/30  brain]
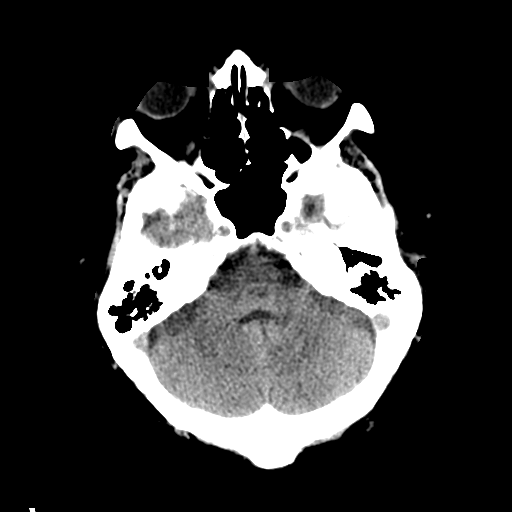
[im 9/30  brain]
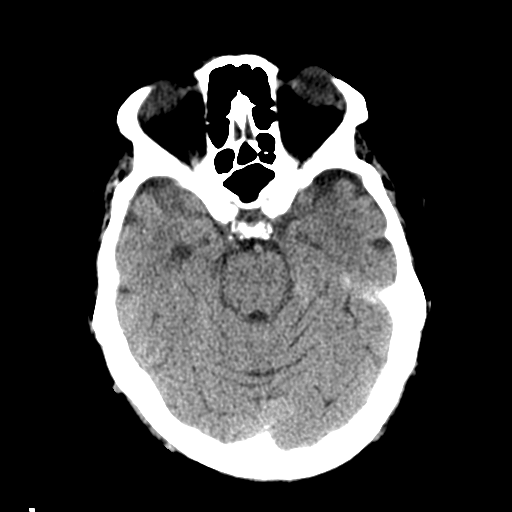
[im 12/30  brain]
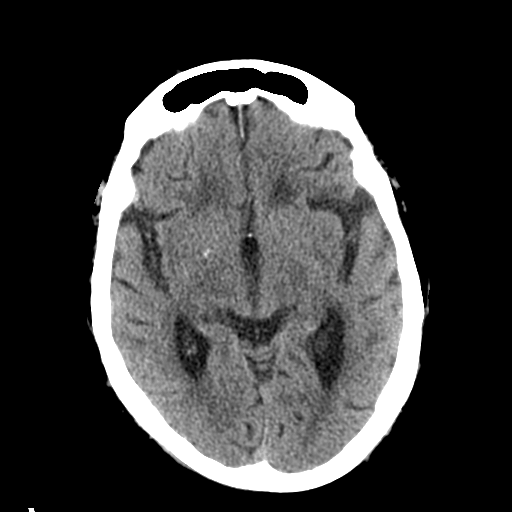
[im 16/30  brain]
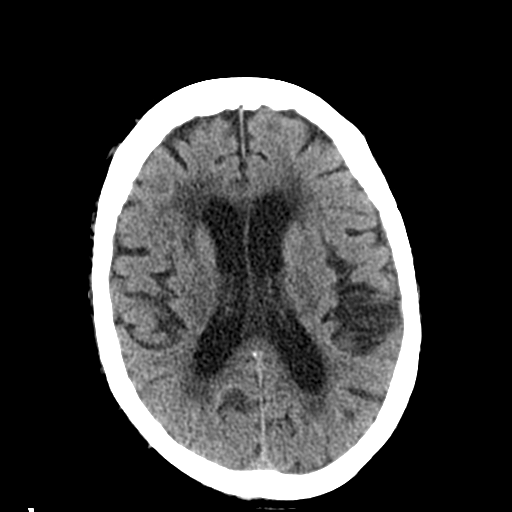
[im 16/30  bone]
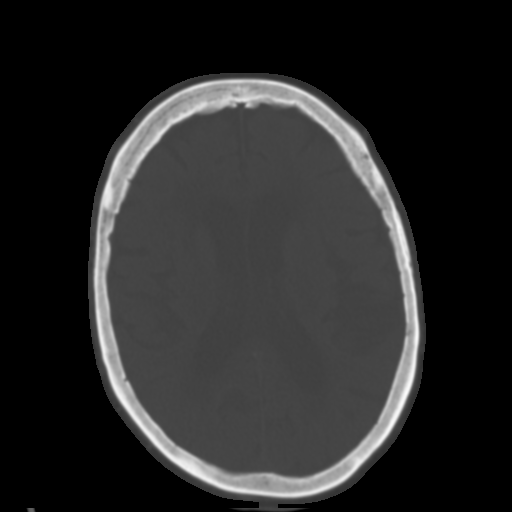
[im 19/30  brain]
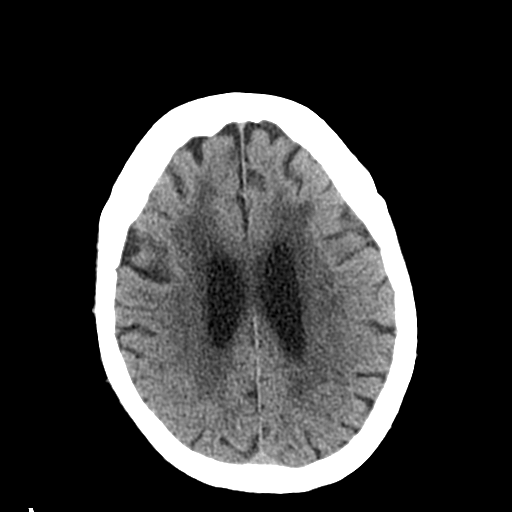
[im 22/30  brain]
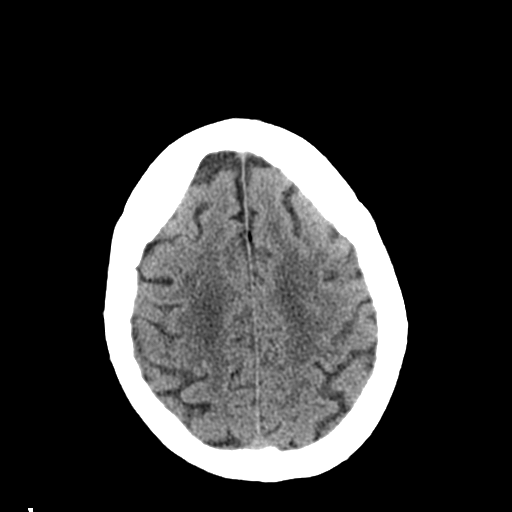
[im 25/30  brain]
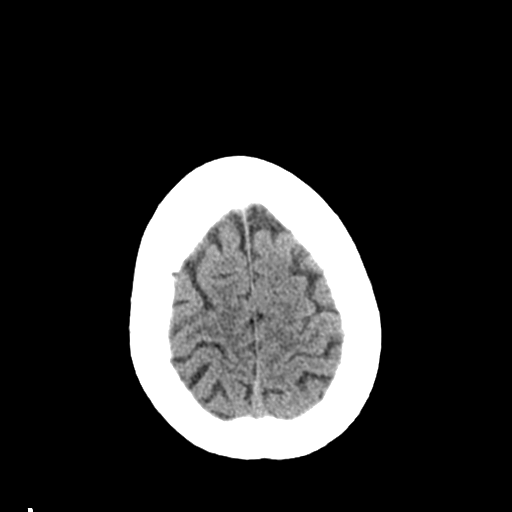
[im 28/30  brain]
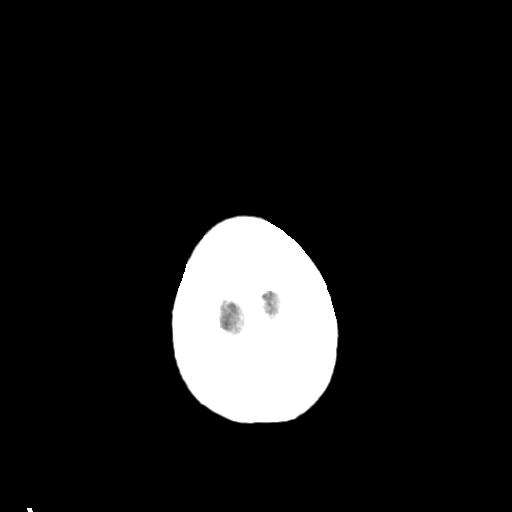
[im 28/30  bone]
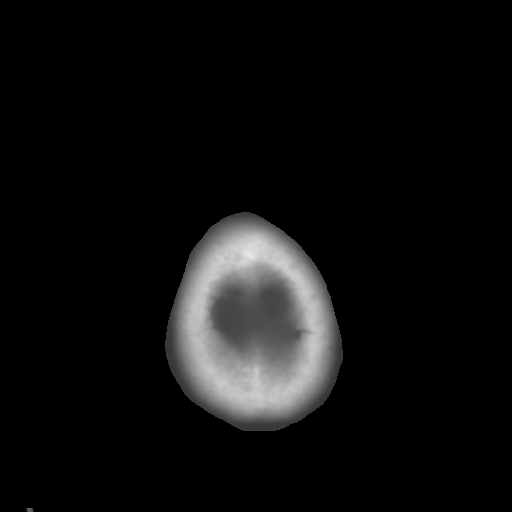

[Series 4: coronal soft tissue · coronal · 0.32mm/px · 3 of 66 slices shown]
[im 22/66  brain]
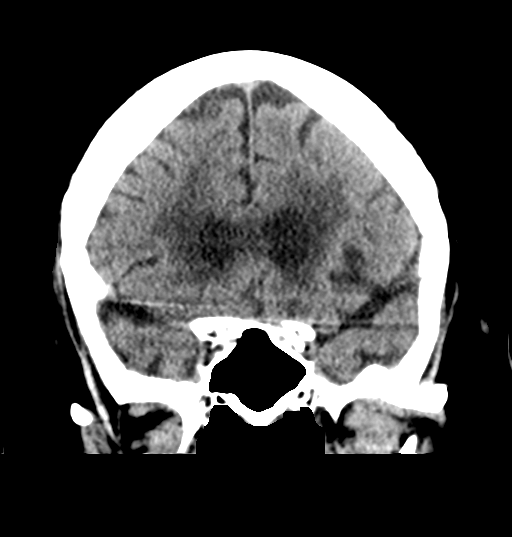
[im 29/66  brain]
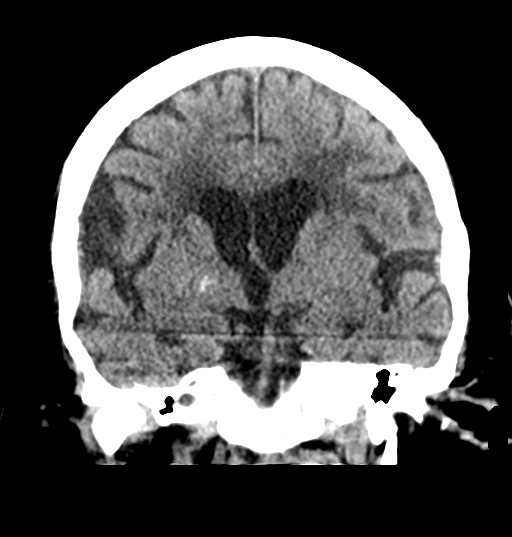
[im 37/66  brain]
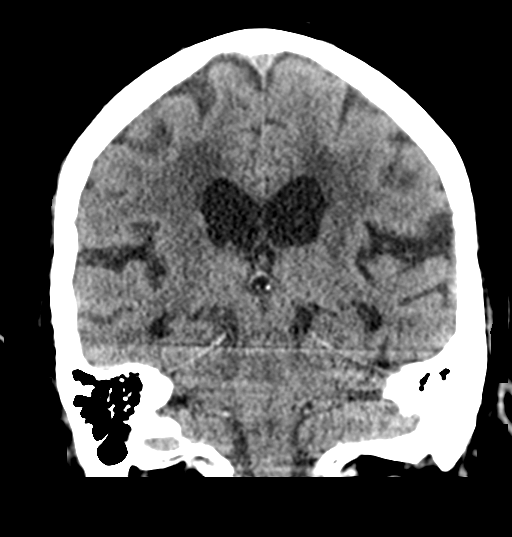

[Series 5: sagittal soft tissue · sagittal · 0.32mm/px · 3 of 58 slices shown]
[im 20/58  brain]
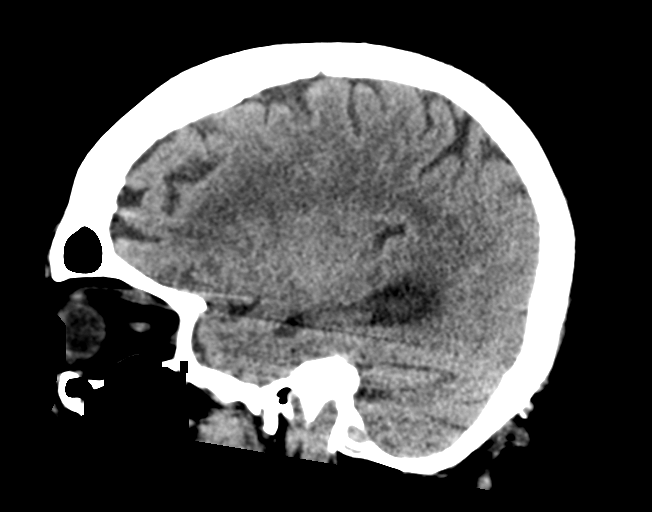
[im 29/58  brain]
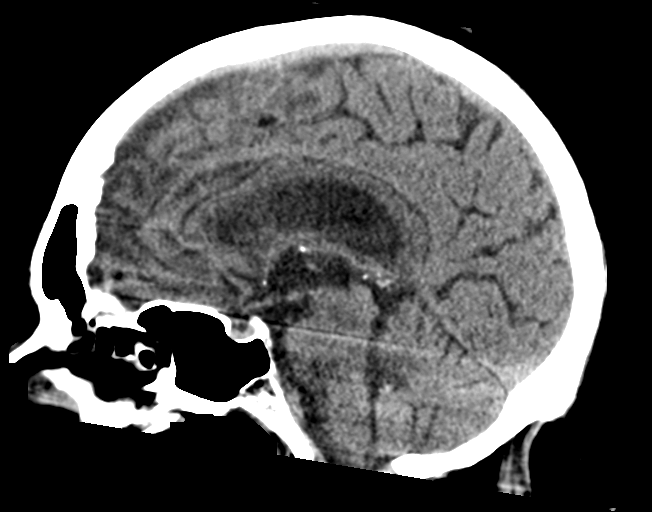
[im 39/58  brain]
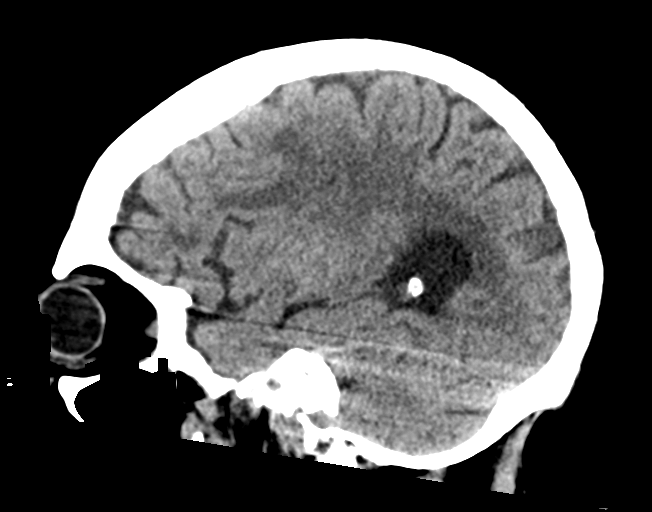

[15 of 47 positions shown; findings below may reference images not displayed]

FINDINGS: Brain: Generalized age-related cerebral atrophy with moderate
chronic microvascular ischemic disease. No acute intracranial
hemorrhage. No acute large vessel territory infarct. No mass lesion,
midline shift or mass effect. No hydrocephalus. No extra-axial fluid
collection.

Vascular: No hyperdense vessel. Scattered vascular calcifications
noted within the carotid siphons.

Skull: Scalp soft tissues demonstrate no acute finding. Calvarium
intact.

Sinuses/Orbits: Globes and orbital soft tissues within normal
limits. Mild layering opacity within the right sphenoid sinus.
Paranasal sinuses are otherwise clear. No mastoid effusion.

Other: None.
IMPRESSION: 1. No acute intracranial abnormality.
2. Generalized age-related cerebral atrophy with moderate chronic
small vessel ischemic disease.
3. Mild right sphenoid sinusitis.

## 2021-04-04 IMAGING — DX LEFT FEMUR 2 VIEWS
4 series · 4 of 4 positions shown · non-contrast
Comparison: None.

CLINICAL DATA: Initial evaluation for acute trauma, fall.

EXAM:
LEFT FEMUR 2 VIEWS

[femur ap (1 of 2)]
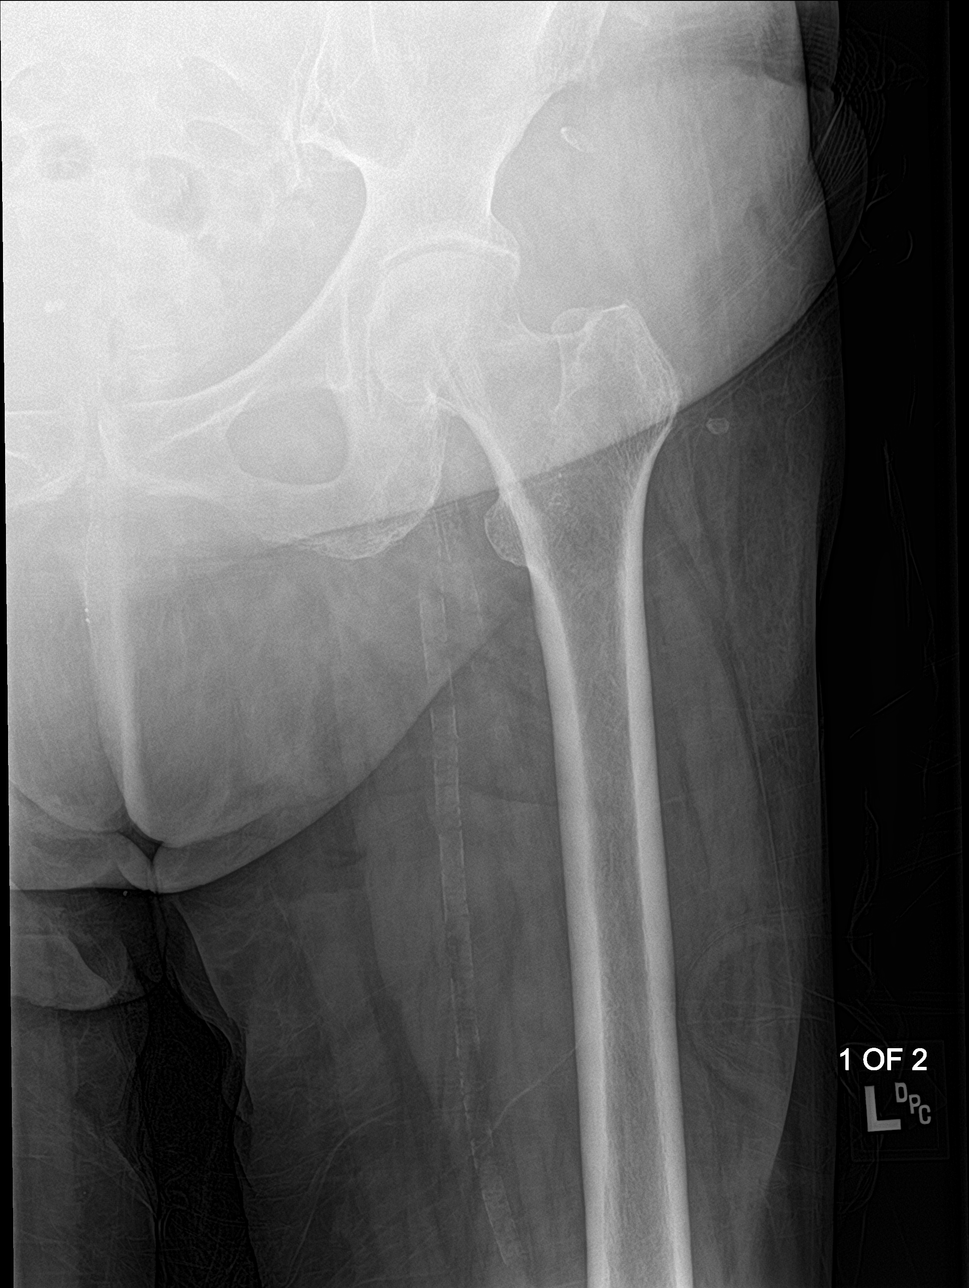

[femur ap (2 of 2)]
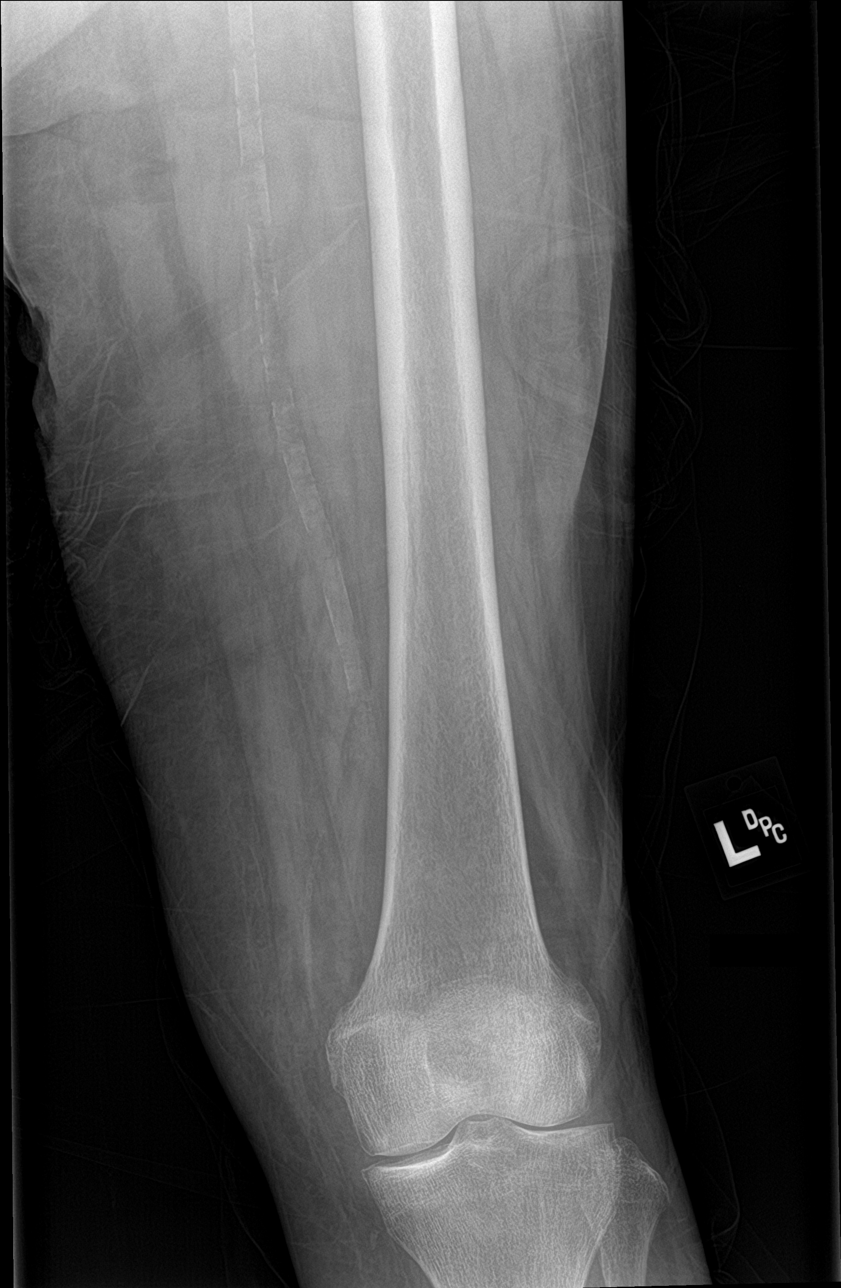

[femur lat (1 of 2)]
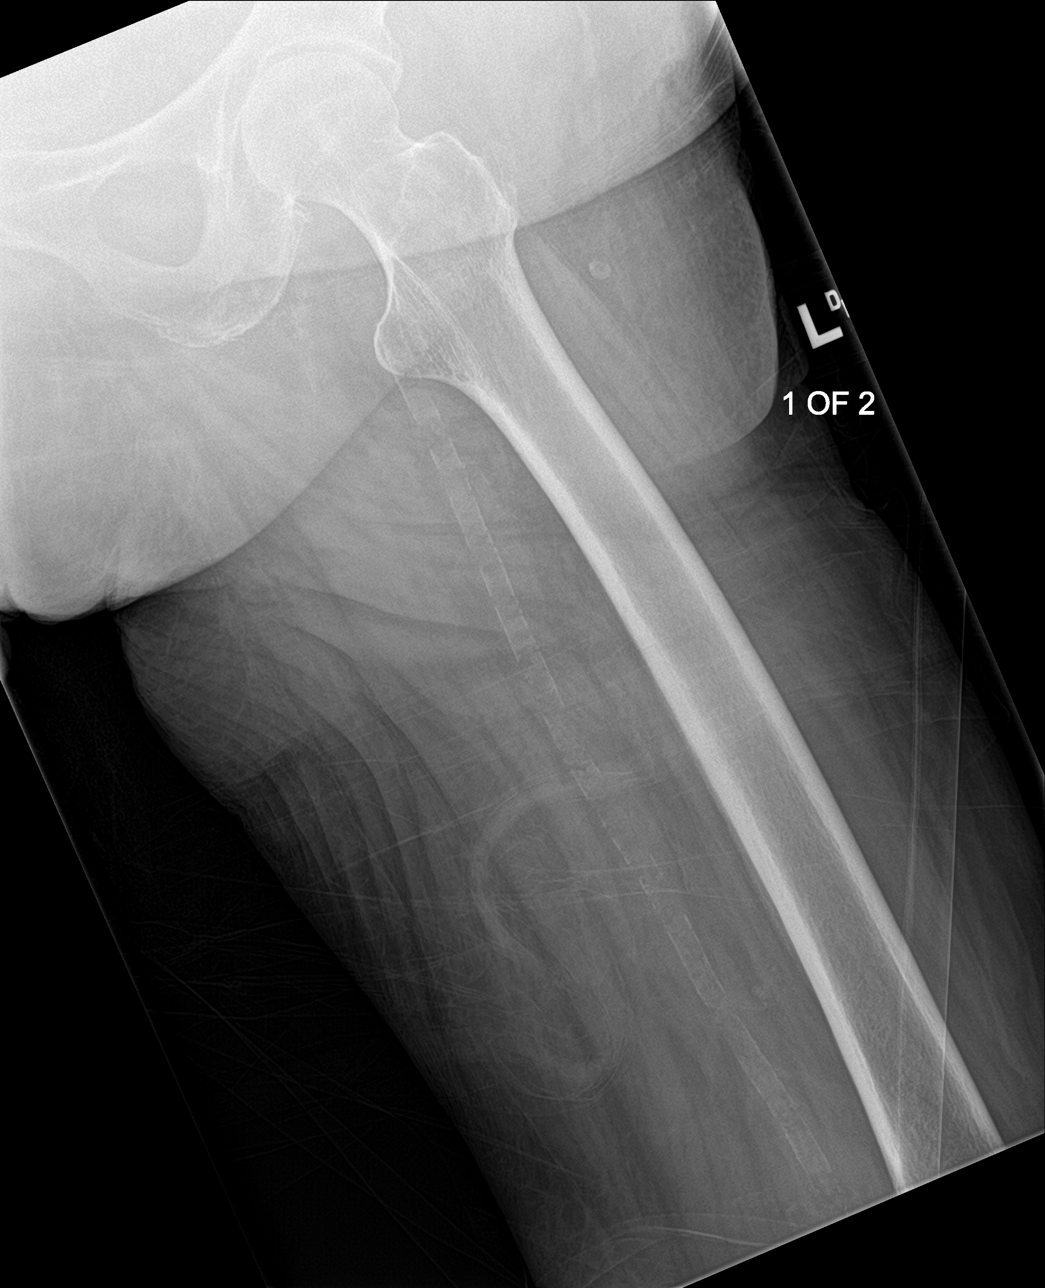

[femur lat (2 of 2)]
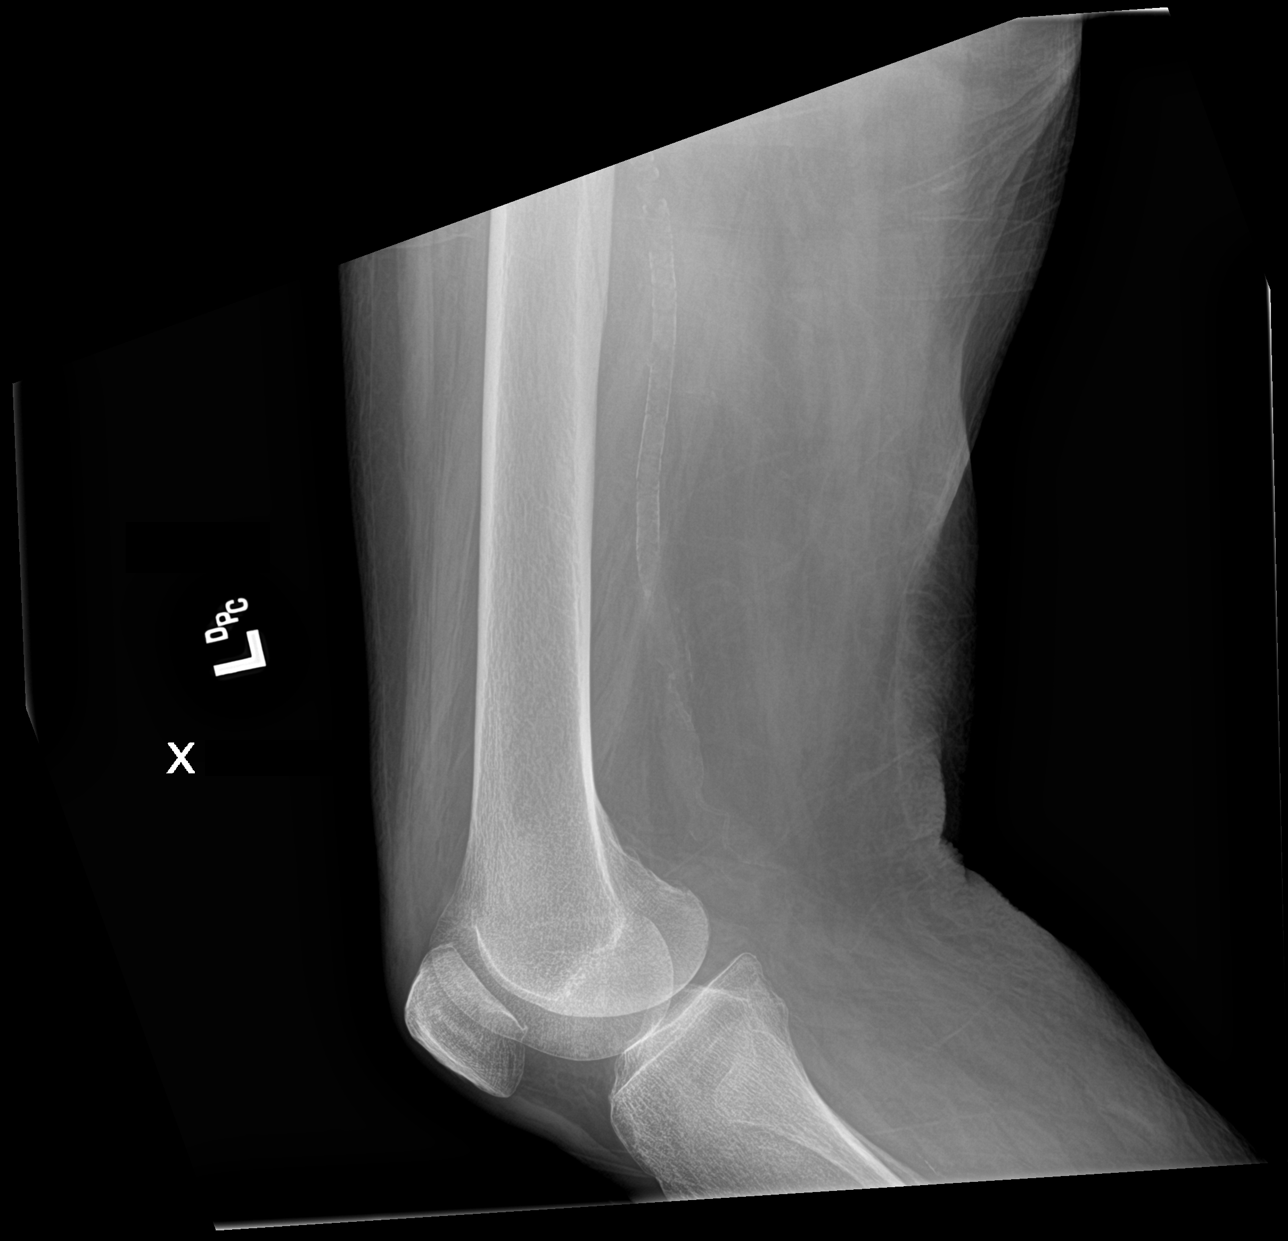

[4 of 4 positions shown; findings below may reference images not displayed]

FINDINGS: No acute fracture or dislocation. Osseous mineralization normal.
Mild osteoarthritic changes about the hip and knee. No soft tissue
abnormality. Prominent atherosclerotic change noted.
IMPRESSION: No acute osseous abnormality about the left femur.

## 2021-04-08 IMAGING — US US RENAL
1 series · 14 of 25 positions shown · non-contrast
Comparison: 05/19/2017 CT abdomen/pelvis.

CLINICAL DATA: Acute kidney injury

EXAM:
RENAL / URINARY TRACT ULTRASOUND COMPLETE

[Series 1: us renal · 14 of 103 slices shown]
[im 1/103]
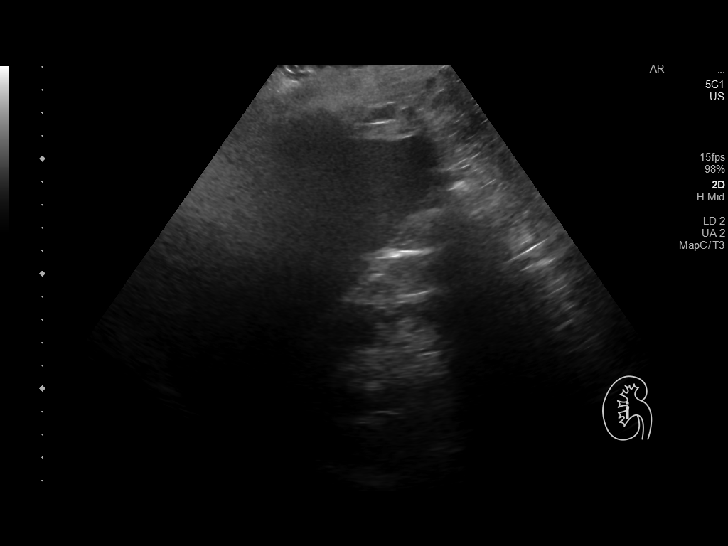
[im 9/103]
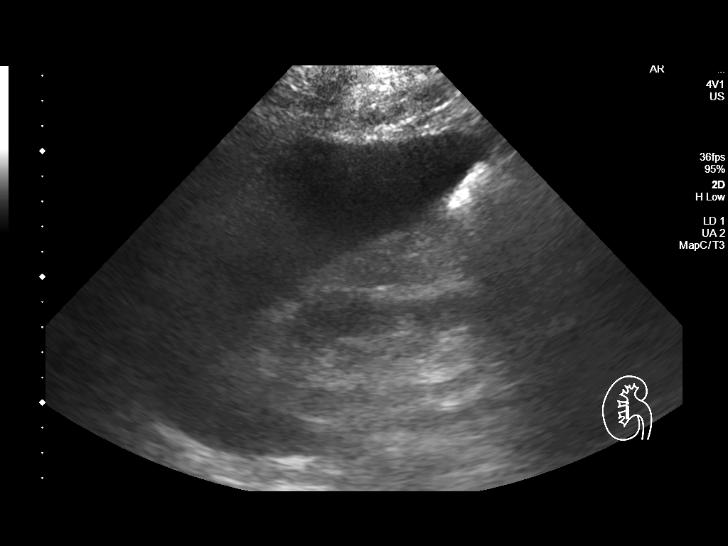
[im 18/103]
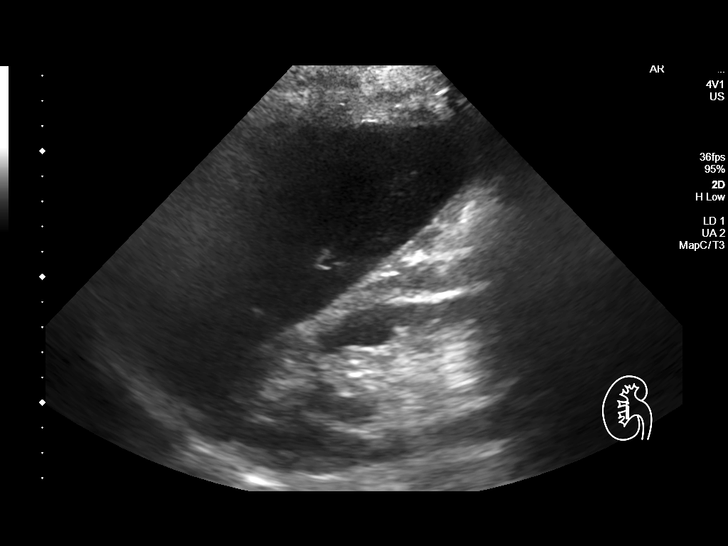
[im 26/103]
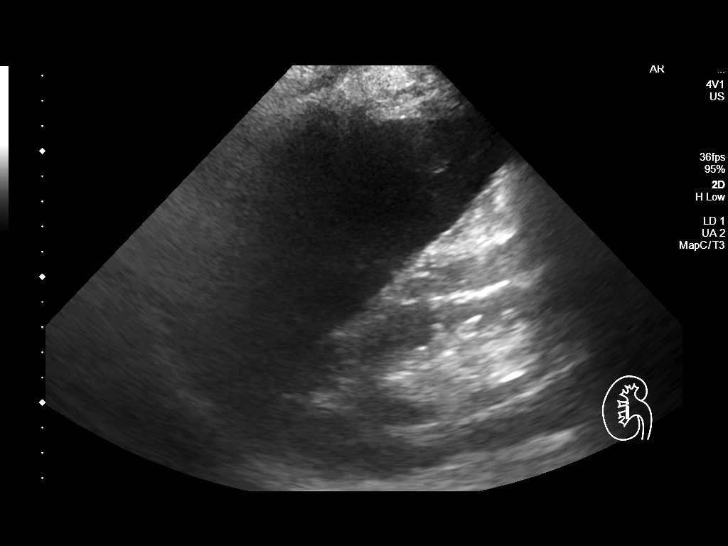
[im 35/103]
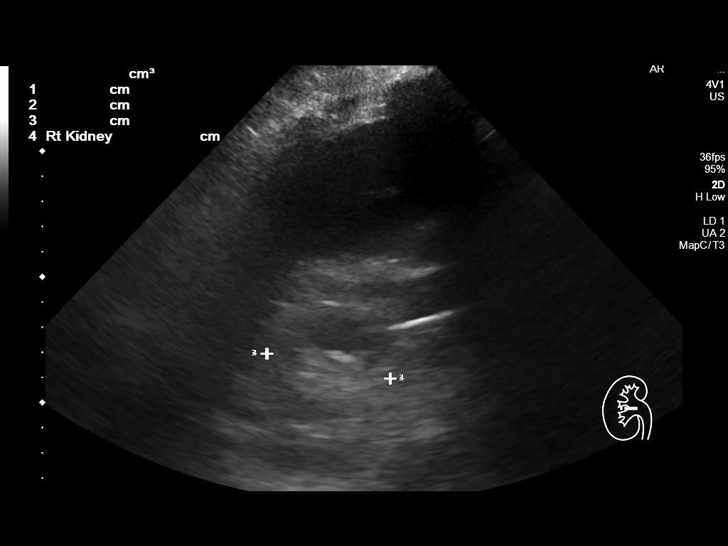
[im 39/103]
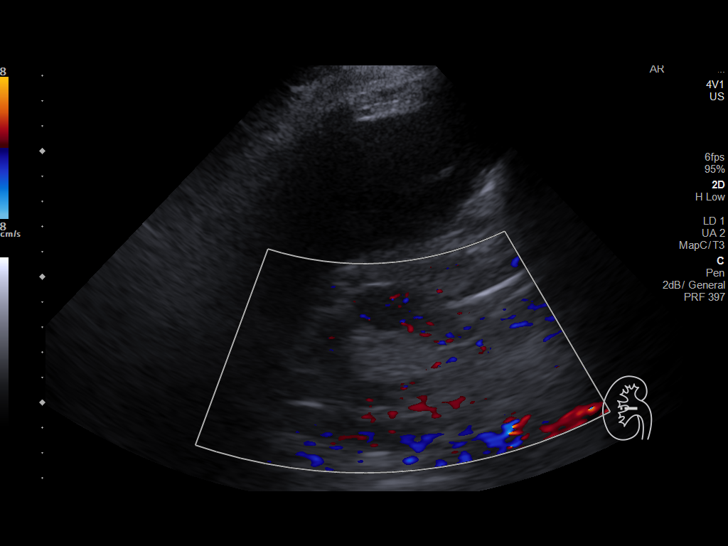
[im 47/103]
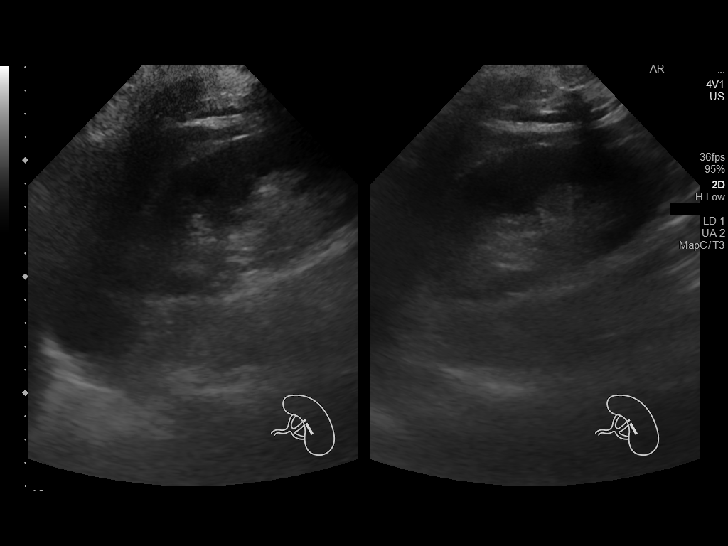
[im 56/103]
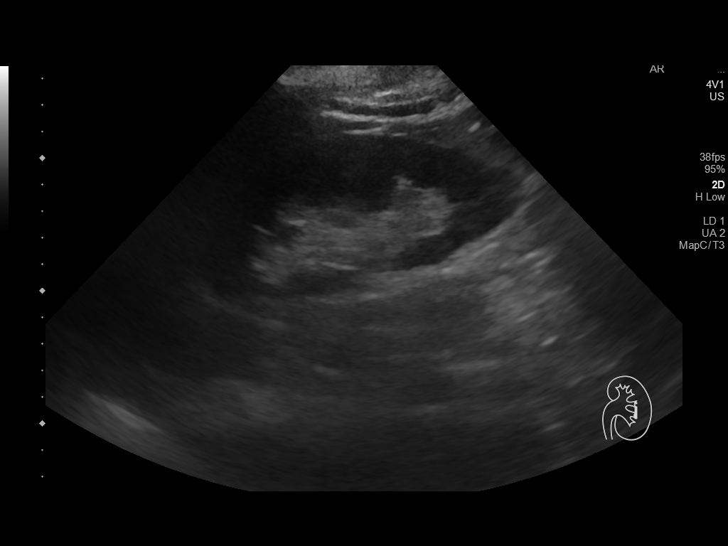
[im 64/103]
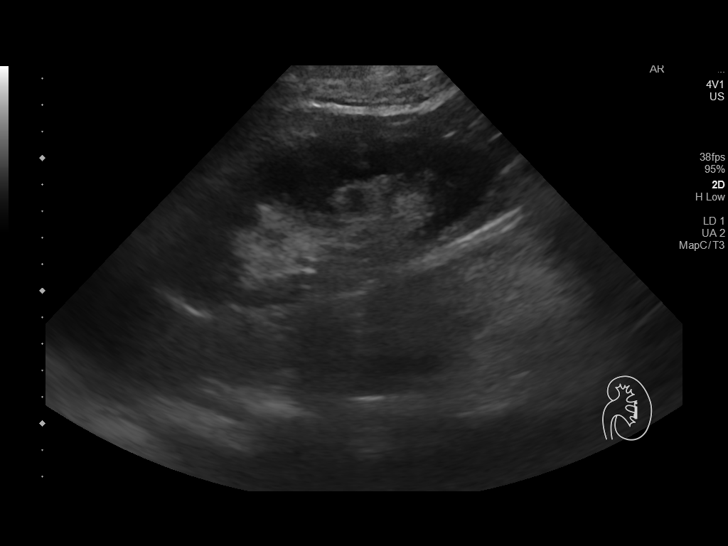
[im 69/103]
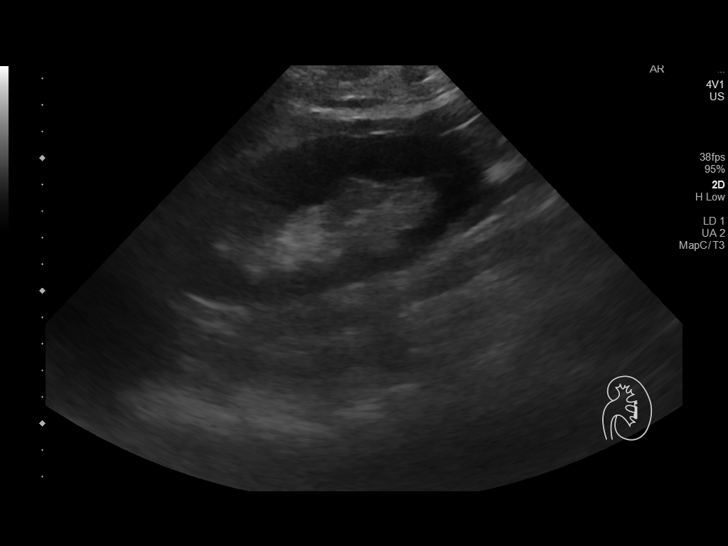
[im 77/103]
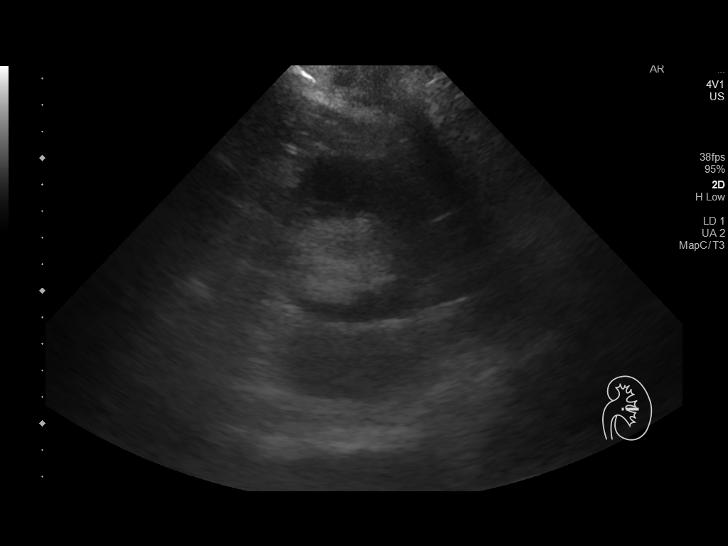
[im 86/103]
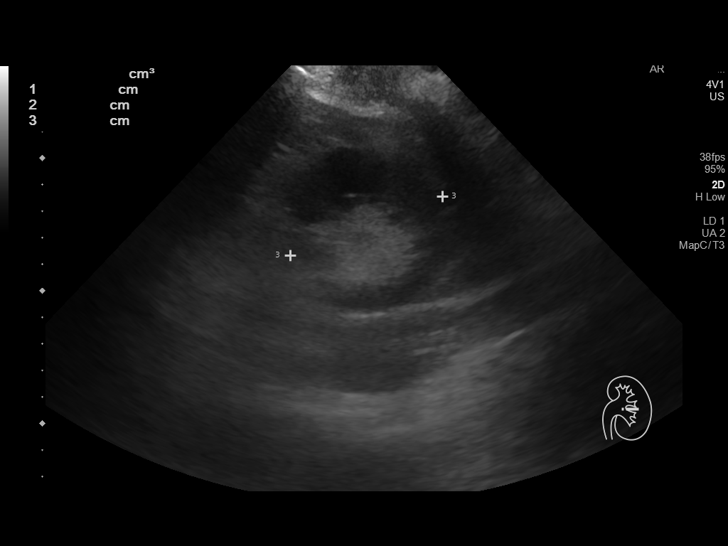
[im 94/103]
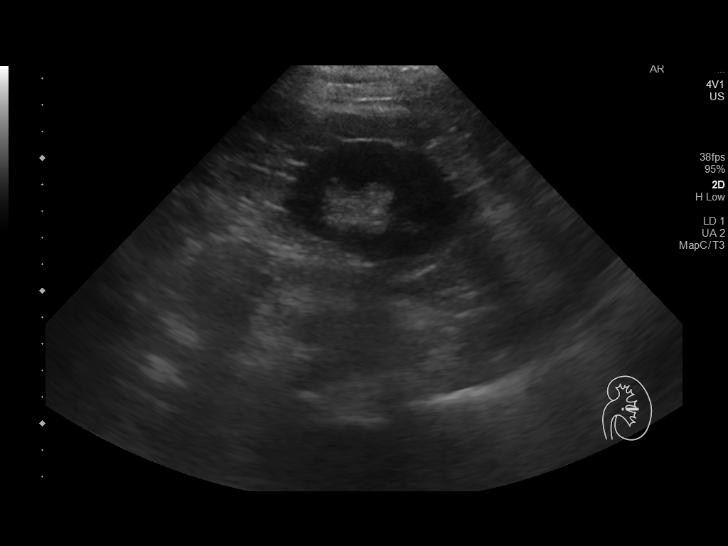
[im 103/103]
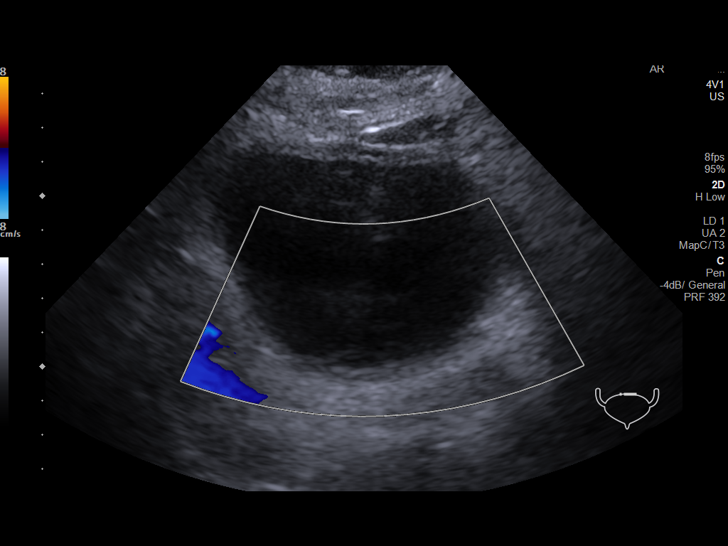

[14 of 25 positions shown; findings below may reference images not displayed]

FINDINGS: Right Kidney:

Renal measurements: 10.0 x 4.9 x 5.0 cm = volume: 129 mL. No
hydronephrosis. Mildly echogenic and mildly atrophic right renal
parenchyma. No right renal mass.

Left Kidney:

Renal measurements: 12.7 x 6.1 x 6.2 cm = volume: 251 mL. No
hydronephrosis. Mildly echogenic renal parenchyma, normal in
thickness. Simple appearing 2.2 x 1.9 x 1.9 cm interpolar left renal
cyst.

Bladder:

Appears normal for degree of bladder distention. Ureteral jets are
not visualized in the bladder on color Doppler.
IMPRESSION: 1. No hydronephrosis.
2. Asymmetric right renal atrophy.
3. Echogenic kidneys, indicative of nonspecific renal parenchymal
disease of uncertain chronicity.
4. Simple appearing left renal cyst.
5. Normal bladder.

## 2021-04-08 IMAGING — CT CT HEAD WITHOUT CONTRAST
3 series · 16 of 47 positions shown, 19 images · non-contrast
Comparison: CT head 03/31/2019

CLINICAL DATA: Altered level of consciousness.

EXAM:
CT HEAD WITHOUT CONTRAST
TECHNIQUE: Contiguous axial images were obtained from the base of the skull
through the vertex without intravenous contrast.

[Series 3: head trauma wo · axial · 0.40mm/px · z∈[-62,+63]mm · 10 of 30 slices shown, 13 images]
[im 3/30  brain]
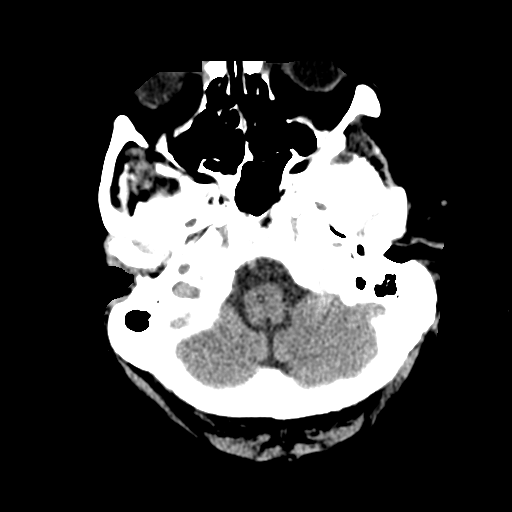
[im 3/30  bone]
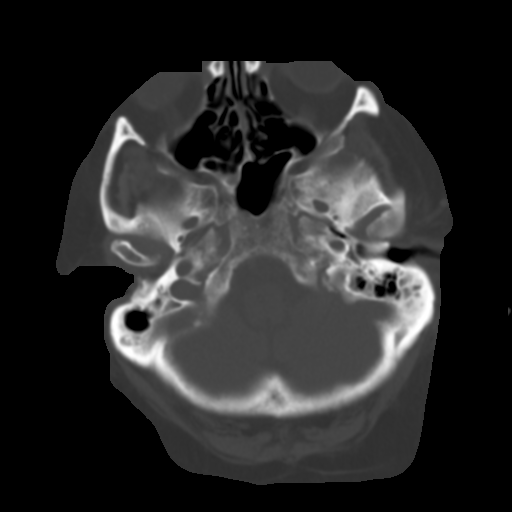
[im 6/30  brain]
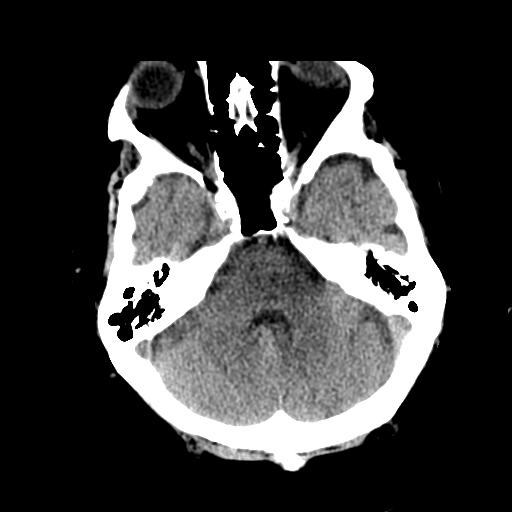
[im 9/30  brain]
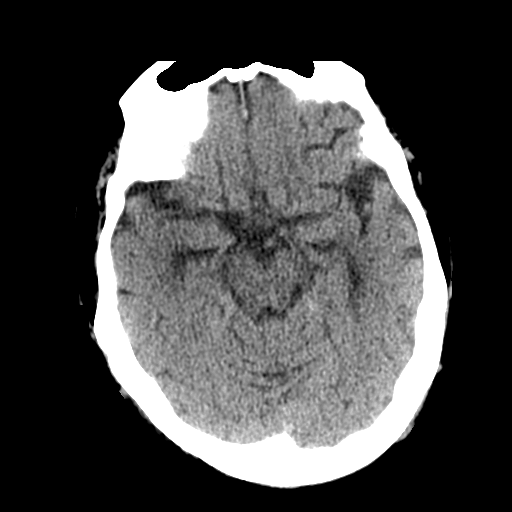
[im 11/30  brain]
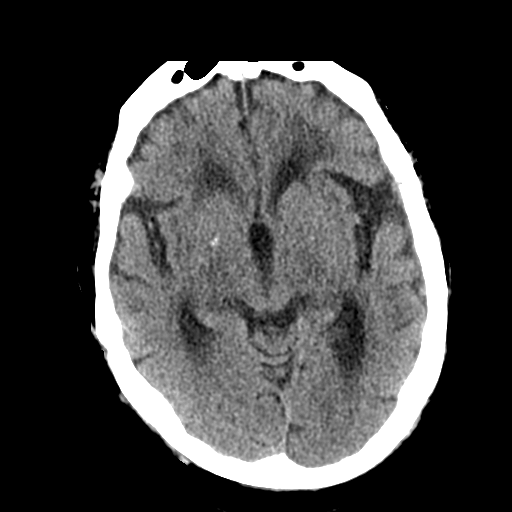
[im 14/30  brain]
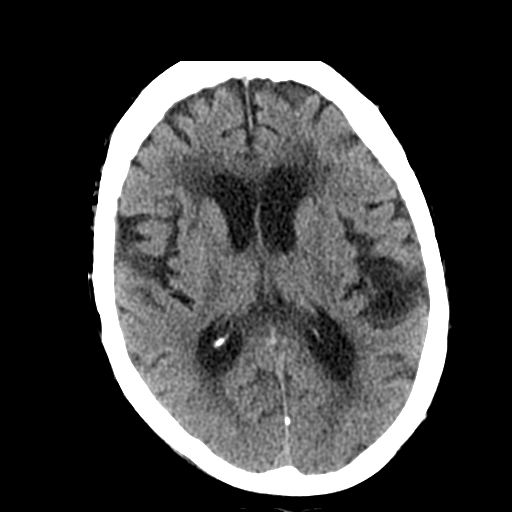
[im 14/30  bone]
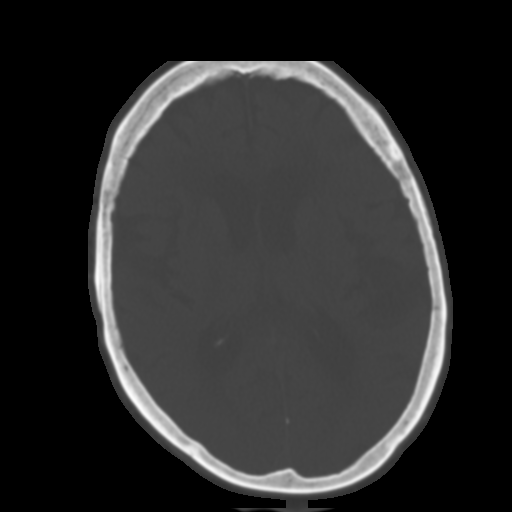
[im 17/30  brain]
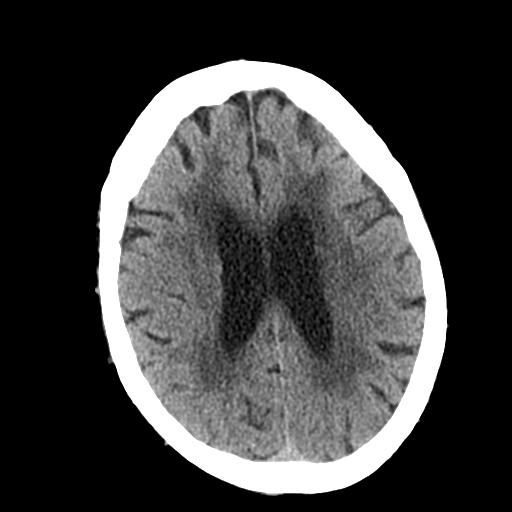
[im 20/30  brain]
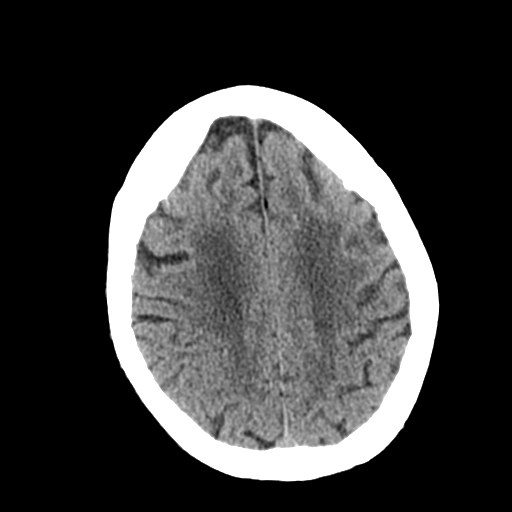
[im 23/30  brain]
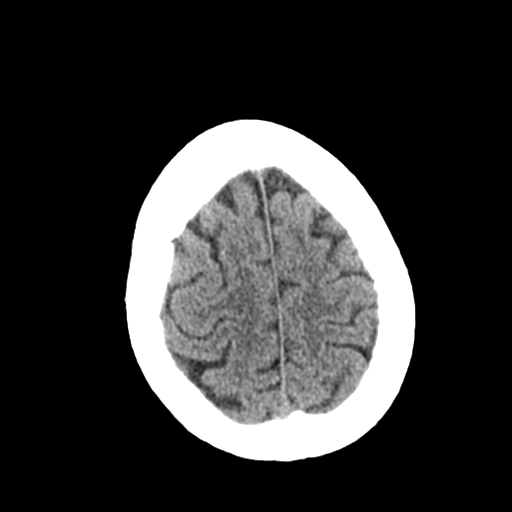
[im 25/30  brain]
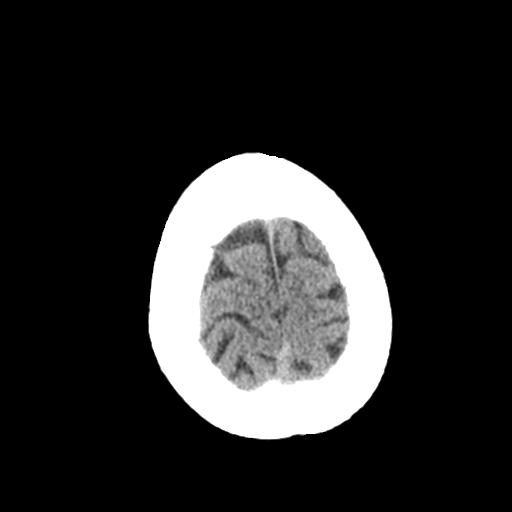
[im 25/30  bone]
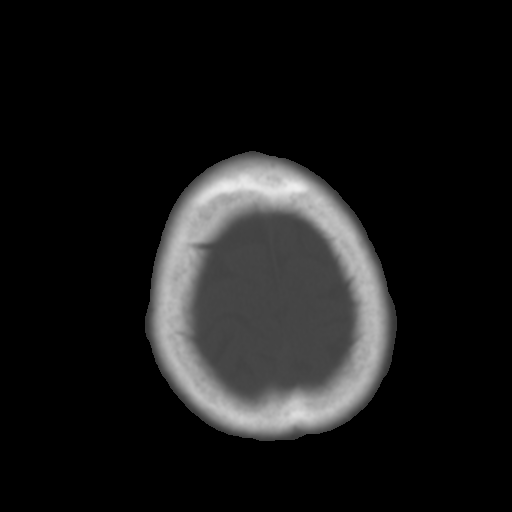
[im 28/30  brain]
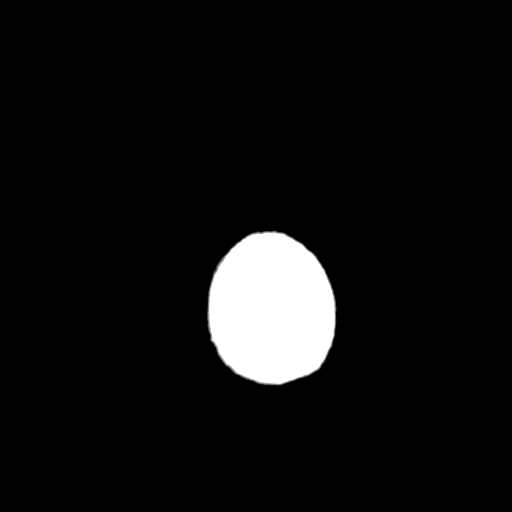

[Series 4: coronal soft tissue · coronal · 0.32mm/px · 3 of 70 slices shown]
[im 24/70  brain]
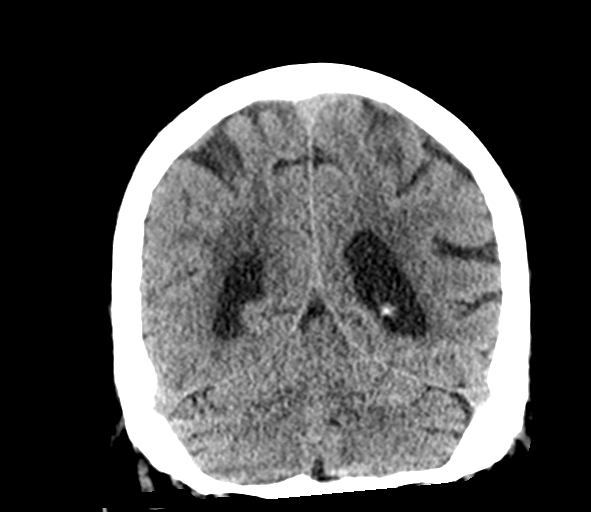
[im 31/70  brain]
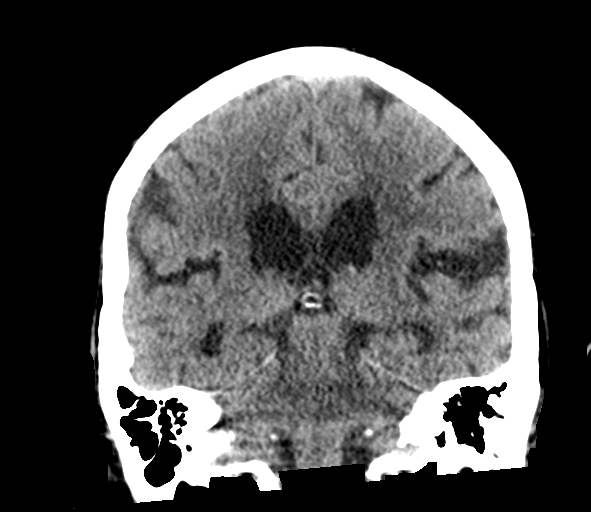
[im 39/70  brain]
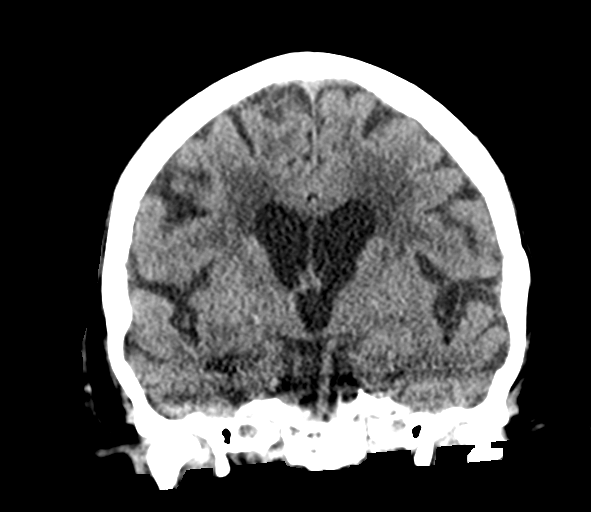

[Series 5: sagittal soft tissue · sagittal · 0.32mm/px · 3 of 64 slices shown]
[im 22/64  brain]
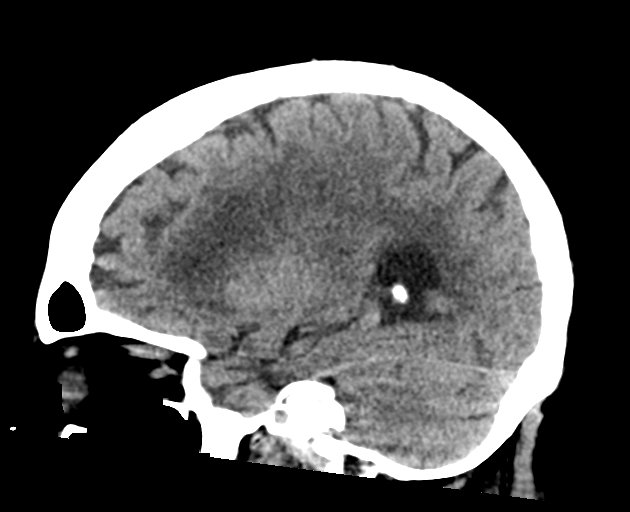
[im 32/64  brain]
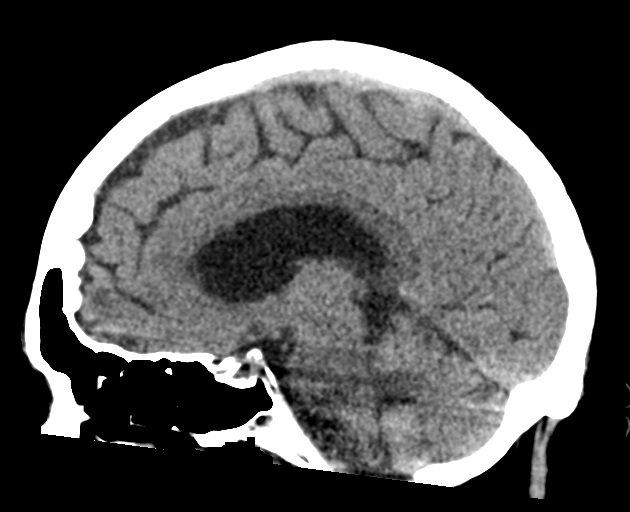
[im 43/64  brain]
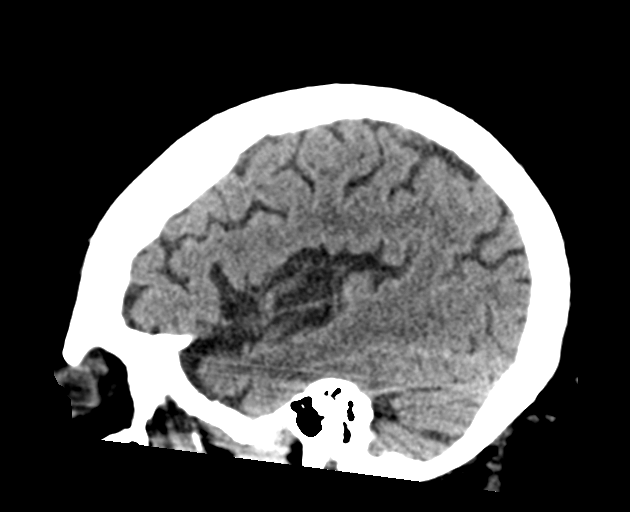

[16 of 47 positions shown; findings below may reference images not displayed]

FINDINGS: Brain: Atrophy and chronic microvascular ischemic changes are
stable. No new findings. No acute infarct, hemorrhage, or mass.

Vascular: Negative for hyperdense vessel

Skull: Negative

Sinuses/Orbits: Small air-fluid level posterior ethmoid sinus on the
right. Remaining sinuses clear. Bilateral cataract surgery.

Other: None
IMPRESSION: No acute abnormality and no change from the recent study.

## 2021-04-09 IMAGING — NM NM PULMONARY VENTILATION AND PERFUSION SCAN
8 series · 8 of 8 positions shown · non-contrast
Comparison: None.

CLINICAL DATA: Hypoxia, chest pain

EXAM:
NUCLEAR MEDICINE PERFUSION LUNG SCAN
TECHNIQUE: Perfusion images were obtained in multiple projections after
intravenous injection of Qc-WWm MAA.
RADIOPHARMACEUTICALS:  4.5 mCi RcSSm MAA IV

[Series 1: ant/post perf · 4.14mm/px · 1 of 1 slices shown (1 of 2)]
[im 1/1]
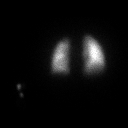

[Series 1: ant/post perf · 4.14mm/px · 1 of 1 slices shown (2 of 2)]
[im 1/1]
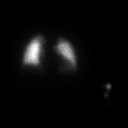

[Series 2: lao/rpo perf · 4.14mm/px · 1 of 1 slices shown (1 of 2)]
[im 1/1]
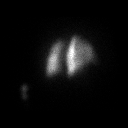

[Series 2: lao/rpo perf · 4.14mm/px · 1 of 1 slices shown (2 of 2)]
[im 1/1]
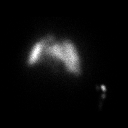

[Series 3: lt lat/rt lat perf · 4.14mm/px · 1 of 1 slices shown (1 of 2)]
[im 1/1]
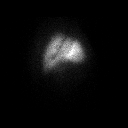

[Series 3: lt lat/rt lat perf · 4.14mm/px · 1 of 1 slices shown (2 of 2)]
[im 1/1]
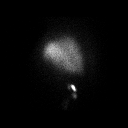

[Series 4: lpo/rao perf · 4.14mm/px · 1 of 1 slices shown (1 of 2)]
[im 1/1]
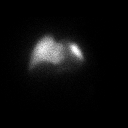

[Series 4: lpo/rao perf · 4.14mm/px · 1 of 1 slices shown (2 of 2)]
[im 1/1]
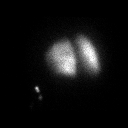

[8 of 8 positions shown; findings below may reference images not displayed]

FINDINGS: Perfusion: No wedge shaped peripheral perfusion defects to suggest
acute pulmonary embolism.
IMPRESSION: No scintigraphic evidence of pulmonary embolus.

## 2021-04-09 IMAGING — DX CHEST  1 VIEW
1 series · 1 of 1 positions shown · non-contrast
Comparison: 04/04/2019, 03/17/2019, CT chest 03/17/2019

CLINICAL DATA: 82-year-old female with a history of hypoxia

EXAM:
CHEST  1 VIEW

[chest ap]
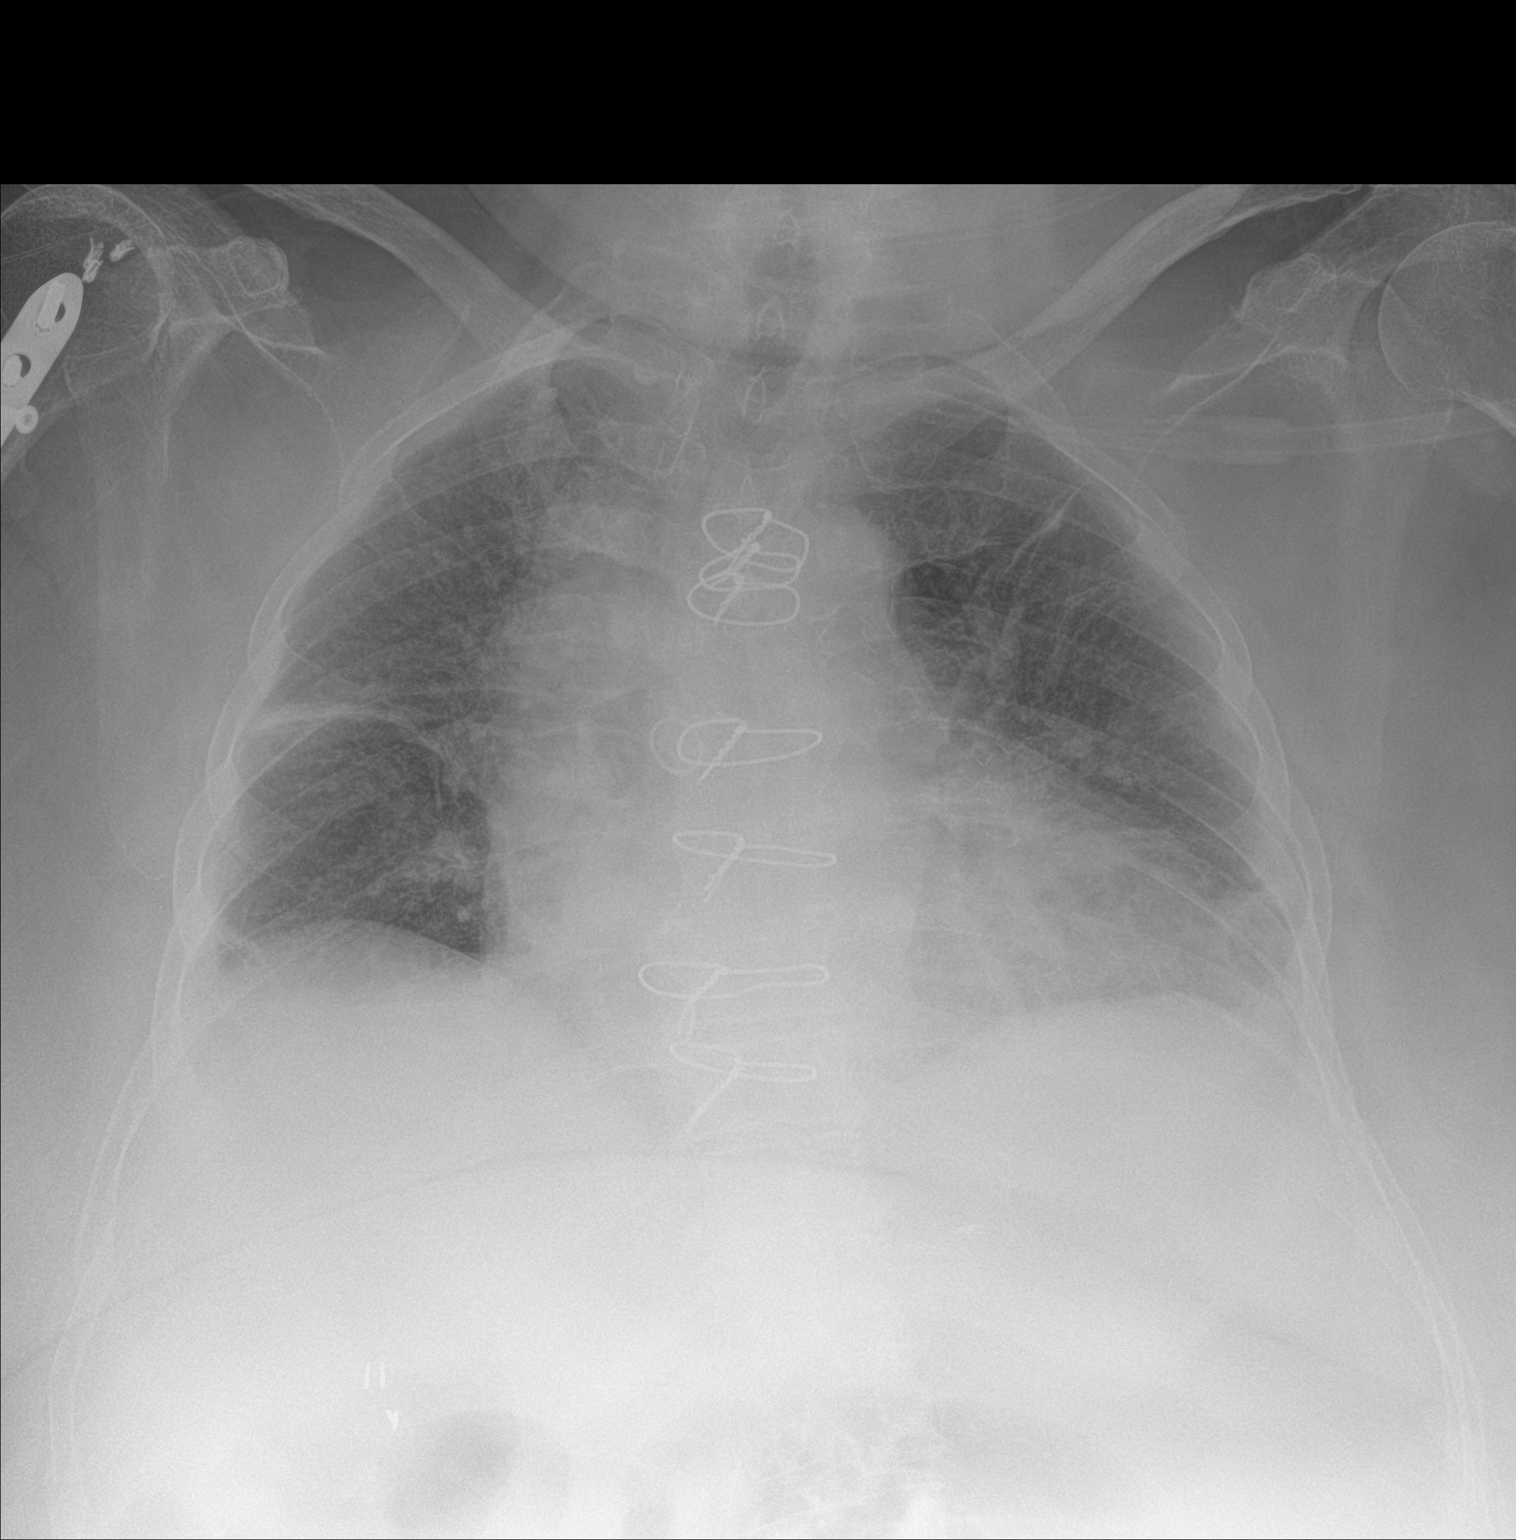

[1 of 1 positions shown; findings below may reference images not displayed]

FINDINGS: Cardiomediastinal silhouette unchanged in size and contour with
surgical changes of median sternotomy and CABG.

Low lung volumes. Interlobular septal thickening with coarsened
interstitial markings. Similar appearance of linear opacity on the
right and linear opacities at the left lung base.

Blunting of the costophrenic angle bilaterally with meniscus on the
right.
IMPRESSION: Similar appearance of low lung volumes with atelectasis and evidence
of edema.

Small right pleural effusion and associated
atelectasis/consolidation.

Surgical changes of median sternotomy and CABG.
